# Patient Record
Sex: Male | Born: 1955 | Race: White | Hispanic: No | Smoking: Current every day smoker
Health system: Southern US, Community
[De-identification: ages and names within clinical notes are randomized; demographics above are authoritative.]

## PROBLEM LIST (undated history)

## (undated) DIAGNOSIS — R058 Other specified cough: Secondary | ICD-10-CM

## (undated) DIAGNOSIS — E1140 Type 2 diabetes mellitus with diabetic neuropathy, unspecified: Secondary | ICD-10-CM

## (undated) DIAGNOSIS — I5189 Other ill-defined heart diseases: Secondary | ICD-10-CM

## (undated) DIAGNOSIS — C679 Malignant neoplasm of bladder, unspecified: Secondary | ICD-10-CM

## (undated) DIAGNOSIS — R399 Unspecified symptoms and signs involving the genitourinary system: Secondary | ICD-10-CM

## (undated) DIAGNOSIS — I739 Peripheral vascular disease, unspecified: Secondary | ICD-10-CM

## (undated) DIAGNOSIS — E119 Type 2 diabetes mellitus without complications: Secondary | ICD-10-CM

## (undated) DIAGNOSIS — Z972 Presence of dental prosthetic device (complete) (partial): Secondary | ICD-10-CM

## (undated) DIAGNOSIS — I701 Atherosclerosis of renal artery: Secondary | ICD-10-CM

## (undated) DIAGNOSIS — Z8551 Personal history of malignant neoplasm of bladder: Secondary | ICD-10-CM

## (undated) DIAGNOSIS — I2510 Atherosclerotic heart disease of native coronary artery without angina pectoris: Secondary | ICD-10-CM

## (undated) DIAGNOSIS — Z8739 Personal history of other diseases of the musculoskeletal system and connective tissue: Secondary | ICD-10-CM

## (undated) DIAGNOSIS — I471 Supraventricular tachycardia, unspecified: Secondary | ICD-10-CM

## (undated) DIAGNOSIS — I10 Essential (primary) hypertension: Secondary | ICD-10-CM

## (undated) DIAGNOSIS — D126 Benign neoplasm of colon, unspecified: Secondary | ICD-10-CM

## (undated) DIAGNOSIS — R05 Cough: Secondary | ICD-10-CM

## (undated) DIAGNOSIS — Z8619 Personal history of other infectious and parasitic diseases: Secondary | ICD-10-CM

## (undated) DIAGNOSIS — K5790 Diverticulosis of intestine, part unspecified, without perforation or abscess without bleeding: Secondary | ICD-10-CM

## (undated) DIAGNOSIS — E785 Hyperlipidemia, unspecified: Secondary | ICD-10-CM

## (undated) DIAGNOSIS — J309 Allergic rhinitis, unspecified: Secondary | ICD-10-CM

## (undated) DIAGNOSIS — Z9189 Other specified personal risk factors, not elsewhere classified: Secondary | ICD-10-CM

## (undated) DIAGNOSIS — A630 Anogenital (venereal) warts: Secondary | ICD-10-CM

## (undated) DIAGNOSIS — J410 Simple chronic bronchitis: Secondary | ICD-10-CM

## (undated) DIAGNOSIS — K219 Gastro-esophageal reflux disease without esophagitis: Secondary | ICD-10-CM

## (undated) DIAGNOSIS — H269 Unspecified cataract: Secondary | ICD-10-CM

## (undated) DIAGNOSIS — I480 Paroxysmal atrial fibrillation: Secondary | ICD-10-CM

## (undated) DIAGNOSIS — Z794 Long term (current) use of insulin: Secondary | ICD-10-CM

## (undated) HISTORY — DX: Supraventricular tachycardia, unspecified: I47.10

## (undated) HISTORY — DX: Type 2 diabetes mellitus with diabetic neuropathy, unspecified: E11.40

## (undated) HISTORY — DX: Malignant neoplasm of bladder, unspecified: C67.9

## (undated) HISTORY — PX: MULTIPLE TOOTH EXTRACTIONS: SHX2053

## (undated) HISTORY — DX: Essential (primary) hypertension: I10

## (undated) HISTORY — PX: MOHS SURGERY: SHX181

## (undated) HISTORY — DX: Benign neoplasm of colon, unspecified: D12.6

## (undated) HISTORY — DX: Unspecified cataract: H26.9

## (undated) HISTORY — DX: Other ill-defined heart diseases: I51.89

## (undated) HISTORY — PX: CATARACT EXTRACTION W/ INTRAOCULAR LENS IMPLANT: SHX1309

## (undated) HISTORY — PX: OTHER SURGICAL HISTORY: SHX169

## (undated) HISTORY — DX: Paroxysmal atrial fibrillation: I48.0

## (undated) HISTORY — PX: CATARACT EXTRACTION: SUR2

## (undated) HISTORY — DX: Atherosclerotic heart disease of native coronary artery without angina pectoris: I25.10

## (undated) HISTORY — DX: Diverticulosis of intestine, part unspecified, without perforation or abscess without bleeding: K57.90

## (undated) HISTORY — DX: Atherosclerosis of renal artery: I70.1

## (undated) HISTORY — PX: PERINEAL HIDRADENITIS EXCISION: SUR524

## (undated) HISTORY — PX: INGUINAL HIDRADENITIS EXCISION: SHX1827

## (undated) HISTORY — DX: Supraventricular tachycardia: I47.1

## (undated) HISTORY — PX: CARDIOVASCULAR STRESS TEST: SHX262

---

## 1990-08-11 DIAGNOSIS — Z794 Long term (current) use of insulin: Secondary | ICD-10-CM

## 1990-08-11 DIAGNOSIS — E119 Type 2 diabetes mellitus without complications: Secondary | ICD-10-CM

## 1990-08-11 HISTORY — DX: Type 2 diabetes mellitus without complications: E11.9

## 1990-08-11 HISTORY — DX: Type 2 diabetes mellitus without complications: Z79.4

## 1995-08-12 HISTORY — PX: AXILLARY HIDRADENITIS EXCISION: SUR522

## 1997-12-27 ENCOUNTER — Ambulatory Visit (HOSPITAL_BASED_OUTPATIENT_CLINIC_OR_DEPARTMENT_OTHER): Admission: RE | Admit: 1997-12-27 | Discharge: 1997-12-27 | Payer: Self-pay | Admitting: Plastic Surgery

## 1998-06-15 ENCOUNTER — Ambulatory Visit (HOSPITAL_BASED_OUTPATIENT_CLINIC_OR_DEPARTMENT_OTHER): Admission: RE | Admit: 1998-06-15 | Discharge: 1998-06-15 | Payer: Self-pay | Admitting: Plastic Surgery

## 2000-04-15 ENCOUNTER — Emergency Department (HOSPITAL_COMMUNITY): Admission: EM | Admit: 2000-04-15 | Discharge: 2000-04-15 | Payer: Self-pay | Admitting: Emergency Medicine

## 2000-04-23 ENCOUNTER — Emergency Department (HOSPITAL_COMMUNITY): Admission: EM | Admit: 2000-04-23 | Discharge: 2000-04-23 | Payer: Self-pay | Admitting: Emergency Medicine

## 2000-12-17 ENCOUNTER — Encounter: Admission: RE | Admit: 2000-12-17 | Discharge: 2001-03-17 | Payer: Self-pay | Admitting: Endocrinology

## 2001-03-01 ENCOUNTER — Encounter (INDEPENDENT_AMBULATORY_CARE_PROVIDER_SITE_OTHER): Payer: Self-pay

## 2001-03-01 ENCOUNTER — Other Ambulatory Visit: Admission: RE | Admit: 2001-03-01 | Discharge: 2001-03-01 | Payer: Self-pay | Admitting: *Deleted

## 2001-08-11 HISTORY — PX: OTHER SURGICAL HISTORY: SHX169

## 2002-09-07 ENCOUNTER — Emergency Department (HOSPITAL_COMMUNITY): Admission: EM | Admit: 2002-09-07 | Discharge: 2002-09-07 | Payer: Self-pay | Admitting: Emergency Medicine

## 2002-09-07 ENCOUNTER — Encounter: Payer: Self-pay | Admitting: Emergency Medicine

## 2004-09-18 ENCOUNTER — Ambulatory Visit: Payer: Self-pay | Admitting: Endocrinology

## 2004-12-06 ENCOUNTER — Ambulatory Visit: Payer: Self-pay | Admitting: Endocrinology

## 2006-02-24 ENCOUNTER — Ambulatory Visit: Payer: Self-pay | Admitting: Endocrinology

## 2006-03-31 ENCOUNTER — Ambulatory Visit: Payer: Self-pay | Admitting: Endocrinology

## 2006-04-01 ENCOUNTER — Ambulatory Visit: Payer: Self-pay | Admitting: Endocrinology

## 2006-05-01 ENCOUNTER — Ambulatory Visit: Payer: Self-pay | Admitting: Endocrinology

## 2006-05-15 ENCOUNTER — Ambulatory Visit: Payer: Self-pay | Admitting: Endocrinology

## 2006-06-04 ENCOUNTER — Ambulatory Visit: Payer: Self-pay | Admitting: Gastroenterology

## 2006-06-15 ENCOUNTER — Ambulatory Visit: Payer: Self-pay | Admitting: Gastroenterology

## 2006-06-15 ENCOUNTER — Encounter (INDEPENDENT_AMBULATORY_CARE_PROVIDER_SITE_OTHER): Payer: Self-pay | Admitting: *Deleted

## 2006-06-30 ENCOUNTER — Ambulatory Visit: Payer: Self-pay | Admitting: Endocrinology

## 2006-09-21 ENCOUNTER — Ambulatory Visit: Payer: Self-pay | Admitting: Endocrinology

## 2006-09-21 LAB — CONVERTED CEMR LAB: Hgb A1c MFr Bld: 8.2 % — ABNORMAL HIGH (ref 4.6–6.0)

## 2006-10-14 ENCOUNTER — Ambulatory Visit: Payer: Self-pay | Admitting: Endocrinology

## 2006-12-09 ENCOUNTER — Ambulatory Visit: Payer: Self-pay | Admitting: Endocrinology

## 2007-03-03 ENCOUNTER — Ambulatory Visit: Payer: Self-pay | Admitting: Endocrinology

## 2007-03-06 ENCOUNTER — Encounter: Payer: Self-pay | Admitting: Endocrinology

## 2007-03-06 DIAGNOSIS — I10 Essential (primary) hypertension: Secondary | ICD-10-CM | POA: Insufficient documentation

## 2007-03-06 DIAGNOSIS — J309 Allergic rhinitis, unspecified: Secondary | ICD-10-CM | POA: Insufficient documentation

## 2007-06-02 ENCOUNTER — Ambulatory Visit: Payer: Self-pay | Admitting: Endocrinology

## 2007-06-02 LAB — CONVERTED CEMR LAB: Hgb A1c MFr Bld: 7.3 % — ABNORMAL HIGH (ref 4.6–6.0)

## 2007-06-03 ENCOUNTER — Telehealth (INDEPENDENT_AMBULATORY_CARE_PROVIDER_SITE_OTHER): Payer: Self-pay | Admitting: *Deleted

## 2007-07-29 ENCOUNTER — Ambulatory Visit (HOSPITAL_COMMUNITY): Admission: RE | Admit: 2007-07-29 | Discharge: 2007-07-29 | Payer: Self-pay | Admitting: Surgery

## 2007-08-17 ENCOUNTER — Encounter: Payer: Self-pay | Admitting: Endocrinology

## 2007-10-06 ENCOUNTER — Ambulatory Visit: Payer: Self-pay | Admitting: Endocrinology

## 2007-10-06 DIAGNOSIS — E7800 Pure hypercholesterolemia, unspecified: Secondary | ICD-10-CM | POA: Insufficient documentation

## 2007-10-07 LAB — CONVERTED CEMR LAB
ALT: 21 units/L (ref 0–53)
AST: 18 units/L (ref 0–37)
Albumin: 4 g/dL (ref 3.5–5.2)
Alkaline Phosphatase: 69 units/L (ref 39–117)
BUN: 14 mg/dL (ref 6–23)
Bilirubin, Direct: 0.1 mg/dL (ref 0.0–0.3)
CO2: 32 meq/L (ref 19–32)
Calcium: 9.9 mg/dL (ref 8.4–10.5)
Chloride: 103 meq/L (ref 96–112)
Cholesterol: 155 mg/dL (ref 0–200)
Creatinine, Ser: 1.1 mg/dL (ref 0.4–1.5)
GFR calc Af Amer: 91 mL/min
GFR calc non Af Amer: 75 mL/min
Glucose, Bld: 118 mg/dL — ABNORMAL HIGH (ref 70–99)
HDL: 29.7 mg/dL — ABNORMAL LOW (ref >39.0)
Hgb A1c MFr Bld: 6.9 % — ABNORMAL HIGH (ref 4.6–6.0)
LDL Cholesterol: 93 mg/dL (ref 0–99)
Potassium: 4 meq/L (ref 3.5–5.1)
Sodium: 140 meq/L (ref 135–145)
Total Bilirubin: 0.8 mg/dL (ref 0.3–1.2)
Total CHOL/HDL Ratio: 5.2
Total Protein: 7.5 g/dL (ref 6.0–8.3)
Triglycerides: 163 mg/dL — ABNORMAL HIGH (ref 0–149)
VLDL: 33 mg/dL (ref 0–40)

## 2007-10-15 ENCOUNTER — Telehealth: Payer: Self-pay | Admitting: Endocrinology

## 2007-11-25 ENCOUNTER — Encounter: Payer: Self-pay | Admitting: Internal Medicine

## 2008-01-17 ENCOUNTER — Ambulatory Visit: Payer: Self-pay | Admitting: Endocrinology

## 2008-01-17 DIAGNOSIS — M25519 Pain in unspecified shoulder: Secondary | ICD-10-CM

## 2008-01-17 DIAGNOSIS — A630 Anogenital (venereal) warts: Secondary | ICD-10-CM

## 2008-01-17 LAB — CONVERTED CEMR LAB: Hgb A1c MFr Bld: 7.1 % — ABNORMAL HIGH (ref 4.6–6.0)

## 2008-05-04 ENCOUNTER — Ambulatory Visit: Payer: Self-pay | Admitting: Endocrinology

## 2008-05-11 ENCOUNTER — Ambulatory Visit: Payer: Self-pay | Admitting: Endocrinology

## 2008-05-11 DIAGNOSIS — R05 Cough: Secondary | ICD-10-CM

## 2008-05-11 DIAGNOSIS — R059 Cough, unspecified: Secondary | ICD-10-CM | POA: Insufficient documentation

## 2008-05-12 ENCOUNTER — Telehealth (INDEPENDENT_AMBULATORY_CARE_PROVIDER_SITE_OTHER): Payer: Self-pay | Admitting: *Deleted

## 2008-06-08 ENCOUNTER — Encounter: Payer: Self-pay | Admitting: Endocrinology

## 2008-12-11 ENCOUNTER — Ambulatory Visit: Payer: Self-pay | Admitting: Endocrinology

## 2008-12-11 LAB — CONVERTED CEMR LAB: Hgb A1c MFr Bld: 7.3 % — ABNORMAL HIGH (ref 4.6–6.5)

## 2008-12-21 ENCOUNTER — Telehealth (INDEPENDENT_AMBULATORY_CARE_PROVIDER_SITE_OTHER): Payer: Self-pay | Admitting: *Deleted

## 2008-12-22 ENCOUNTER — Telehealth (INDEPENDENT_AMBULATORY_CARE_PROVIDER_SITE_OTHER): Payer: Self-pay | Admitting: *Deleted

## 2009-04-06 ENCOUNTER — Telehealth: Payer: Self-pay | Admitting: Endocrinology

## 2009-05-07 ENCOUNTER — Telehealth: Payer: Self-pay | Admitting: Endocrinology

## 2009-05-22 ENCOUNTER — Ambulatory Visit: Payer: Self-pay | Admitting: Endocrinology

## 2009-05-22 DIAGNOSIS — F17200 Nicotine dependence, unspecified, uncomplicated: Secondary | ICD-10-CM | POA: Insufficient documentation

## 2009-06-06 ENCOUNTER — Encounter (INDEPENDENT_AMBULATORY_CARE_PROVIDER_SITE_OTHER): Payer: Self-pay | Admitting: *Deleted

## 2009-06-29 ENCOUNTER — Encounter (INDEPENDENT_AMBULATORY_CARE_PROVIDER_SITE_OTHER): Payer: Self-pay | Admitting: *Deleted

## 2009-07-10 ENCOUNTER — Telehealth: Payer: Self-pay | Admitting: Endocrinology

## 2009-07-18 ENCOUNTER — Encounter (INDEPENDENT_AMBULATORY_CARE_PROVIDER_SITE_OTHER): Payer: Self-pay

## 2009-07-20 ENCOUNTER — Encounter (INDEPENDENT_AMBULATORY_CARE_PROVIDER_SITE_OTHER): Payer: Self-pay

## 2009-07-20 ENCOUNTER — Ambulatory Visit: Payer: Self-pay | Admitting: Gastroenterology

## 2009-07-24 ENCOUNTER — Telehealth: Payer: Self-pay | Admitting: Endocrinology

## 2009-07-25 ENCOUNTER — Ambulatory Visit: Payer: Self-pay | Admitting: Endocrinology

## 2009-07-25 DIAGNOSIS — J45909 Unspecified asthma, uncomplicated: Secondary | ICD-10-CM

## 2009-08-14 ENCOUNTER — Ambulatory Visit: Payer: Self-pay | Admitting: Gastroenterology

## 2009-08-16 ENCOUNTER — Encounter: Payer: Self-pay | Admitting: Gastroenterology

## 2009-08-25 ENCOUNTER — Ambulatory Visit: Payer: Self-pay | Admitting: Internal Medicine

## 2010-03-13 ENCOUNTER — Ambulatory Visit: Payer: Self-pay | Admitting: Endocrinology

## 2010-03-13 LAB — CONVERTED CEMR LAB: Hgb A1c MFr Bld: 7.4 % — ABNORMAL HIGH (ref 4.6–6.5)

## 2010-05-02 ENCOUNTER — Ambulatory Visit: Payer: Self-pay | Admitting: Endocrinology

## 2010-05-02 DIAGNOSIS — M79609 Pain in unspecified limb: Secondary | ICD-10-CM

## 2010-07-09 ENCOUNTER — Ambulatory Visit: Payer: Self-pay | Admitting: Endocrinology

## 2010-07-09 LAB — CONVERTED CEMR LAB
ALT: 24 units/L (ref 0–53)
AST: 20 units/L (ref 0–37)
Basophils Relative: 0.5 % (ref 0.0–3.0)
Bilirubin Urine: NEGATIVE
Chloride: 101 meq/L (ref 96–112)
Cholesterol: 144 mg/dL (ref 0–200)
Creatinine,U: 129.9 mg/dL
Eosinophils Relative: 5 % (ref 0.0–5.0)
GFR calc non Af Amer: 74.21 mL/min (ref >60)
HCT: 47 % (ref 39.0–52.0)
Hemoglobin, Urine: NEGATIVE
Hemoglobin: 16.7 g/dL (ref 13.0–17.0)
Hgb A1c MFr Bld: 7.5 % — ABNORMAL HIGH (ref 4.6–6.5)
LDL Cholesterol: 86 mg/dL (ref 0–99)
Lymphs Abs: 3.1 10*3/uL (ref 0.7–4.0)
MCV: 98.9 fL (ref 78.0–100.0)
Microalb, Ur: 1.5 mg/dL (ref 0.0–1.9)
Monocytes Absolute: 0.3 10*3/uL (ref 0.1–1.0)
Monocytes Relative: 2.6 % — ABNORMAL LOW (ref 3.0–12.0)
Potassium: 4.4 meq/L (ref 3.5–5.1)
RBC: 4.75 M/uL (ref 4.22–5.81)
Sodium: 136 meq/L (ref 135–145)
TSH: 1.04 microintl units/mL (ref 0.35–5.50)
Total Protein, Urine: NEGATIVE mg/dL
Total Protein: 6.9 g/dL (ref 6.0–8.3)
Urine Glucose: NEGATIVE mg/dL
Urobilinogen, UA: 0.2 (ref 0.0–1.0)
VLDL: 25.6 mg/dL (ref 0.0–40.0)
WBC: 12.2 10*3/uL — ABNORMAL HIGH (ref 4.5–10.5)

## 2010-09-08 LAB — CONVERTED CEMR LAB
ALT: 23 units/L (ref 0–53)
AST: 19 units/L (ref 0–37)
Albumin: 3.9 g/dL (ref 3.5–5.2)
Alkaline Phosphatase: 62 units/L (ref 39–117)
BUN: 16 mg/dL (ref 6–23)
Basophils Absolute: 0.1 10*3/uL (ref 0.0–0.1)
Basophils Relative: 1 % (ref 0.0–3.0)
Bilirubin Urine: NEGATIVE
Bilirubin, Direct: 0.1 mg/dL (ref 0.0–0.3)
CO2: 27 meq/L (ref 19–32)
Calcium: 9.8 mg/dL (ref 8.4–10.5)
Chloride: 101 meq/L (ref 96–112)
Cholesterol: 144 mg/dL (ref 0–200)
Creatinine, Ser: 1.1 mg/dL (ref 0.4–1.5)
Creatinine,U: 72.2 mg/dL
Eosinophils Absolute: 0.5 10*3/uL (ref 0.0–0.7)
Eosinophils Relative: 3.8 % (ref 0.0–5.0)
GFR calc non Af Amer: 74.53 mL/min (ref >60)
Glucose, Bld: 129 mg/dL — ABNORMAL HIGH (ref 70–99)
HCT: 46.3 % (ref 39.0–52.0)
HDL: 29.4 mg/dL — ABNORMAL LOW (ref >39.00)
Hemoglobin: 16.2 g/dL (ref 13.0–17.0)
Hgb A1c MFr Bld: 7 % — ABNORMAL HIGH (ref 4.6–6.5)
Ketones, ur: NEGATIVE mg/dL
LDL Cholesterol: 79 mg/dL (ref 0–99)
Leukocytes, UA: NEGATIVE
Lymphocytes Relative: 26.9 % (ref 12.0–46.0)
Lymphs Abs: 3.6 10*3/uL (ref 0.7–4.0)
MCHC: 34.9 g/dL (ref 30.0–36.0)
MCV: 98 fL (ref 78.0–100.0)
Microalb Creat Ratio: 13.9 mg/g (ref 0.0–30.0)
Microalb, Ur: 1 mg/dL (ref 0.0–1.9)
Monocytes Absolute: 0.9 10*3/uL (ref 0.1–1.0)
Monocytes Relative: 6.5 % (ref 3.0–12.0)
Neutro Abs: 8.2 10*3/uL — ABNORMAL HIGH (ref 1.4–7.7)
Neutrophils Relative %: 61.8 % (ref 43.0–77.0)
Nitrite: NEGATIVE
PSA: 1.05 ng/mL (ref 0.10–4.00)
Platelets: 283 10*3/uL (ref 150.0–400.0)
Potassium: 3.9 meq/L (ref 3.5–5.1)
RBC: 4.72 M/uL (ref 4.22–5.81)
RDW: 11.9 % (ref 11.5–14.6)
Sodium: 142 meq/L (ref 135–145)
Specific Gravity, Urine: 1.02 (ref 1.000–1.030)
TSH: 5.35 microintl units/mL (ref 0.35–5.50)
Total Bilirubin: 0.7 mg/dL (ref 0.3–1.2)
Total CHOL/HDL Ratio: 5
Total Protein, Urine: NEGATIVE mg/dL
Total Protein: 7.2 g/dL (ref 6.0–8.3)
Triglycerides: 179 mg/dL — ABNORMAL HIGH (ref 0.0–149.0)
Urine Glucose: NEGATIVE mg/dL
Urobilinogen, UA: 0.2 (ref 0.0–1.0)
VLDL: 35.8 mg/dL (ref 0.0–40.0)
WBC: 13.3 10*3/uL — ABNORMAL HIGH (ref 4.5–10.5)
pH: 5 (ref 5.0–8.0)

## 2010-09-10 ENCOUNTER — Ambulatory Visit
Admission: RE | Admit: 2010-09-10 | Discharge: 2010-09-10 | Payer: Self-pay | Source: Home / Self Care | Attending: Endocrinology | Admitting: Endocrinology

## 2010-09-10 ENCOUNTER — Encounter: Payer: Self-pay | Admitting: Endocrinology

## 2010-09-10 DIAGNOSIS — R9431 Abnormal electrocardiogram [ECG] [EKG]: Secondary | ICD-10-CM | POA: Insufficient documentation

## 2010-09-10 DIAGNOSIS — R160 Hepatomegaly, not elsewhere classified: Secondary | ICD-10-CM | POA: Insufficient documentation

## 2010-09-10 NOTE — Assessment & Plan Note (Signed)
Summary: CHEST COLD...AS.   Vital Signs:  Patient profile:   55 year old male Weight:      203 pounds O2 Sat:      97 % on Room air Temp:     98.5 degrees F oral Pulse rate:   84 / minute Resp:     22 per minute BP sitting:   132 / 70  (left arm)  Vitals Entered By: Doristine Devoid (August 25, 2009 10:17 AM)  O2 Flow:  Room air CC: sinus and chest congestion  Comments -wife recently diagnosed w/ flu and pneumonia   History of Present Illness: having "fluid down in my chest" Wife with pneumonia and now he is sick over the past couple of days  some SOB--even just with talking Cough--very "deep" --with clear mucus No fever but did have sweat  some head congestion some clear rhinorrhea No sore throat No ear pain  hasn't taken any meds except his inhaler  Allergies: No Known Drug Allergies  Past History:  Past medical, surgical, family and social histories (including risk factors) reviewed for relevance to current acute and chronic problems.  Past Medical History: Reviewed history from 05/11/2008 and no changes required. Allergic rhinitis Diabetes mellitus, type I Hypertension Smoker DM Nepharopathy Non-Cardiogenic Herpes Labialis Chronic left shoulder pain since injury (2004) Disable due 2 left rotator cuff injury Rectal Warts  Past Surgical History: Reviewed history from 03/06/2007 and no changes required. S/P Bilateral groin (1998) Stress Cardiogram (01/29/1996) EKG (03/31/2006)  Family History: Reviewed history from 05/22/2009 and no changes required. father had lung cancer  Social History: Reviewed history from 05/22/2009 and no changes required. disabled married  Review of Systems       No N/V appetite okay  Physical Exam  General:  alert.  NAD Head:  no sinus tenderness Ears:  R ear normal and L ear normal.   Nose:  mild nasal congestion Mouth:  mild pharyngeal injection without exudate Neck:  supple, no masses, and no cervical  lymphadenopathy.   Lungs:  normal respiratory effort, no intercostal retractions, no accessory muscle use, normal breath sounds, no crackles, and no wheezes.     Impression & Recommendations:  Problem # 1:  BRONCHITIS- ACUTE (ICD-466.0) Assessment New just started no wheezing or respiratory compromise likely just viral  discussed supportive care if worsening, will start doxy  His updated medication list for this problem includes:    Proventil 90 Mcg/act Aers (Albuterol) ..... Use prn    Doxycycline Hyclate 100 Mg Tabs (Doxycycline hyclate) .Marland Kitchen... 1 by mouth two times a day for bronchitis  Complete Medication List: 1)  Metformin Hcl 1000 Mg Tabs (Metformin hcl) .... Take 1 by mouth two times a day 2)  Clarinex 5 Mg Tabs (Desloratadine) .... Take 1 by mouth qd 3)  Zestoretic 20-12.5 Mg Tabs (Lisinopril-hydrochlorothiazide) .... Take 1 by mouth qd 4)  Proventil 90 Mcg/act Aers (Albuterol) .... Use prn 5)  Novolin 70/30 70-30 % Susp (Insulin isophane & regular) .... Inject 25 units q am, 15 units q pm 6)  Pravastatin Sodium 40 Mg Tabs (Pravastatin sodium) .... Take 2 by mouth qd 7)  Vicodin 5-500 Mg Tabs (Hydrocodone-acetaminophen) .Marland Kitchen.. 1 tid as needed pain 8)  Bd Insulin Syringe Ultrafine 31g X 5/16" 1 Ml Misc (Insulin syringe-needle u-100) 9)  Doxycycline Hyclate 100 Mg Tabs (Doxycycline hyclate) .Marland Kitchen.. 1 by mouth two times a day for bronchitis  Patient Instructions: 1)  Please schedule a follow-up appointment as needed .  2)  Please start the antibiotic if you start coughing up yellow or green mucus Prescriptions: DOXYCYCLINE HYCLATE 100 MG TABS (DOXYCYCLINE HYCLATE) 1 by mouth two times a day for bronchitis  #20 x 0   Entered and Authorized by:   Cindee Salt MD   Signed by:   Cindee Salt MD on 08/25/2009   Method used:   Print then Give to Patient   RxID:   [id number]

## 2010-09-10 NOTE — Letter (Signed)
Summary: Patient Notice- Polyp Results  [facility]  [address] [place], [place] [address]   Phone: [phone]  Fax: [phone]        August 16, 2009 MRN: [id number]    [name] [address] [place], [place]  [address]    Dear [name],  I am pleased to inform you that the colon polyp(s) removed during your recent colonoscopy was (were) found to be benign (no cancer detected) upon pathologic examination.  I recommend you have a repeat colonoscopy examination in 5_ years to look for recurrent polyps, as having colon polyps increases your risk for having recurrent polyps or even colon cancer in the future.  Should you develop new or worsening symptoms of abdominal pain, bowel habit changes or bleeding from the rectum or bowels, please schedule an evaluation with either your primary care physician or with me.  Additional information/recommendations:  __ No further action with gastroenterology is needed at this time. Please      follow-up with your primary care physician for your other healthcare      needs.  __ Please call [phone] to schedule a return visit to review your      situation.  __ Please keep your follow-up visit as already scheduled.  _x_ Continue treatment plan as outlined the day of your exam.  Please call us if you are having persistent problems or have questions about your condition that have not been fully answered at this time.  Sincerely,  Louis Meckel MD  This letter has been electronically signed by your physician.  Appended Document: Patient Notice- Polyp Results Letter mailed 01.10.11

## 2010-09-10 NOTE — Assessment & Plan Note (Signed)
Summary: FU ON DM /NWS  #   Vital Signs:  Patient profile:   55 year old male Height:      71 inches (180.34 cm) Weight:      195.50 pounds (88.86 kg) BMI:     27.37 O2 Sat:      96 % on Room air Temp:     98.1 degrees F (36.72 degrees C) oral Pulse rate:   69 / minute Pulse rhythm:   regular BP sitting:   138 / 64  (left arm) Cuff size:   large  Vitals Entered By: Brenton Grills CMA Duncan Dull) (July 09, 2010 9:14 AM)  O2 Flow:  Room air CC: Follow-up visit/aj Is Patient Diabetic? Yes   CC:  Follow-up visit/aj.  History of Present Illness: the status of at least 3 ongoing medical problems is addressed today: dm: no cbg record, but states cbg's are well-controlled.   he says in general, cbg's are highest before lunch, and lowest at hs.  he only takes 10 units insulin in the evening. htn: he takes zestoretic as rx'ed.  no cough. dyslipidemia: he tolerates pravachol well.   Current Medications (verified): 1)  Metformin Hcl 1000 Mg  Tabs (Metformin Hcl) .... Take 1 By Mouth Two Times A Day 2)  Clarinex 5 Mg  Tabs (Desloratadine) .... Take 1 By Mouth Qd 3)  Zestoretic 20-12.5 Mg  Tabs (Lisinopril-Hydrochlorothiazide) .... Take 1 By Mouth Qd 4)  Proventil 90 Mcg/act  Aers (Albuterol) .... Use Prn 5)  Novolin 70/30 70-30 %  Susp (Insulin Isophane & Regular) .... Inject 20 Units Each Am, 15 Units Before Evening Meal 6)  Pravastatin Sodium 40 Mg  Tabs (Pravastatin Sodium) .... Take 2 By Mouth Qd 7)  Vicodin 5-500 Mg Tabs (Hydrocodone-Acetaminophen) .Marland Kitchen.. 1 Tid As Needed Pain 8)  Bd Insulin Syringe Ultrafine 31g X 5/16" 1 Ml Misc (Insulin Syringe-Needle U-100) 9)  Doxycycline Hyclate 100 Mg Tabs (Doxycycline Hyclate) .Marland Kitchen.. 1 By Mouth Two Times A Day For Bronchitis  Allergies (verified): No Known Drug Allergies  Past History:  Past Medical History: Last updated: 05/11/2008 Allergic rhinitis Diabetes mellitus, type I Hypertension Smoker DM Nepharopathy Non-Cardiogenic Herpes  Labialis Chronic left shoulder pain since injury (2004) Disable due 2 left rotator cuff injury Rectal Warts  Review of Systems  The patient denies hypoglycemia.         he has lost 10 lbs, due to his efforts.   Physical Exam  General:  normal appearance.   Lungs:  Clear to auscultation bilaterally. Normal respiratory effort.  Heart:  Regular rate and rhythm without murmurs or gallops noted. Normal S1,S2.   Additional Exam:  LDL Cholesterol           86 mg/dL                    4-03 Hemoglobin A1C       [H]  7.5 %       Impression & Recommendations:  Problem # 1:  DIABETES MELLITUS, TYPE I (ICD-250.01) this is the best control this pt should aim for, given this regimen, which does match insulin to his changing needs throughout the day  Problem # 2:  HYPERCHOLESTEROLEMIA (ICD-272.0) well-controlled  Problem # 3:  HYPERTENSION (ICD-401.9) well-controlled  Medications Added to Medication List This Visit: 1)  Clarinex 5 Mg Tabs (Desloratadine) .... Take 1 by mouth once daily 2)  Zestoretic 20-12.5 Mg Tabs (Lisinopril-hydrochlorothiazide) .... Take 1 by mouth once daily 3)  Novolin 70/30  70-30 % Susp (Insulin isophane & regular) .... Inject 20 units each am, 15 units before evening meal 4)  Novolin 70/30 70-30 % Susp (Insulin isophane & regular) .... Inject 20 units each am, 10 units before evening meal 5)  Pravastatin Sodium 40 Mg Tabs (Pravastatin sodium) .... Take 2 by mouth once daily 6)  Relion Pen Needles 31g X 8 Mm Misc (Insulin pen needle) .... Two times a day  Other Orders: TLB-BMP (Basic Metabolic Panel-BMET) (80048-METABOL) TLB-CBC Platelet - w/Differential (85025-CBCD) TLB-Hepatic/Liver Function Pnl (80076-HEPATIC) TLB-TSH (Thyroid Stimulating Hormone) (84443-TSH) TLB-Lipid Panel (80061-LIPID) TLB-A1C / Hgb A1C (Glycohemoglobin) (83036-A1C) TLB-Microalbumin/Creat Ratio, Urine (82043-MALB) TLB-Udip w/ Micro (81001-URINE) Est. Patient Level IV (78295)  Patient  Instructions: 1)  please schedule a physical. 2)  blood tests are being ordered for you today.  please call [phone] to hear your test results.   3)  (update: i left message on phone-tree:  continue just 10 units of insulin in the evening) Prescriptions: RELION PEN NEEDLES 31G X 8 MM MISC (INSULIN PEN NEEDLE) two times a day  #60 x 11   Entered and Authorized by:   Minus Breeding MD   Signed by:   Minus Breeding MD on 07/09/2010   Method used:   Electronically to        [facility] (retail)       [address]       [place], [place]  [address]       Ph: [phone]       Fax: [phone]   RxID:   [id number]    Orders Added: 1)  TLB-BMP (Basic Metabolic Panel-BMET) [80048-METABOL] 2)  TLB-CBC Platelet - w/Differential [85025-CBCD] 3)  TLB-Hepatic/Liver Function Pnl [80076-HEPATIC] 4)  TLB-TSH (Thyroid Stimulating Hormone) [84443-TSH] 5)  TLB-Lipid Panel [80061-LIPID] 6)  TLB-A1C / Hgb A1C (Glycohemoglobin) [83036-A1C] 7)  TLB-Microalbumin/Creat Ratio, Urine [82043-MALB] 8)  TLB-Udip w/ Micro [81001-URINE] 9)  Est. Patient Level IV [87564]

## 2010-09-10 NOTE — Assessment & Plan Note (Signed)
Summary: FU---STC   Vital Signs:  Patient profile:   55 year old male Height:      71 inches (180.34 cm) Weight:      202.50 pounds (92.05 kg) BMI:     28.35 O2 Sat:      96 % on Room air Temp:     97.2 degrees F (36.22 degrees C) oral Pulse rate:   77 / minute BP sitting:   130 / 72  (left arm) Cuff size:   large  Vitals Entered By: Brenton Grills MA (March 13, 2010 10:44 AM)  O2 Flow:  Room air CC: F/U appt/refills-samples of Novolin, Pravastatin, Zestoretic/aj   CC:  F/U appt/refills-samples of Novolin, Pravastatin, and Zestoretic/aj.  History of Present Illness: he brings a record of his cbg's which i have reviewed today.  it is as low as 80 in the 80's.  in am, it varies from 105-180.  pt states he feels well in general.  Current Medications (verified): 1)  Metformin Hcl 1000 Mg  Tabs (Metformin Hcl) .... Take 1 By Mouth Two Times A Day 2)  Clarinex 5 Mg  Tabs (Desloratadine) .... Take 1 By Mouth Qd 3)  Zestoretic 20-12.5 Mg  Tabs (Lisinopril-Hydrochlorothiazide) .... Take 1 By Mouth Qd 4)  Proventil 90 Mcg/act  Aers (Albuterol) .... Use Prn 5)  Novolin 70/30 70-30 %  Susp (Insulin Isophane & Regular) .... Inject 25 Units Q Am, 15 Units Q Pm 6)  Pravastatin Sodium 40 Mg  Tabs (Pravastatin Sodium) .... Take 2 By Mouth Qd 7)  Vicodin 5-500 Mg Tabs (Hydrocodone-Acetaminophen) .Marland Kitchen.. 1 Tid As Needed Pain 8)  Bd Insulin Syringe Ultrafine 31g X 5/16" 1 Ml Misc (Insulin Syringe-Needle U-100) 9)  Doxycycline Hyclate 100 Mg Tabs (Doxycycline Hyclate) .Marland Kitchen.. 1 By Mouth Two Times A Day For Bronchitis  Allergies (verified): No Known Drug Allergies  Past History:  Past Medical History: Last updated: 05/11/2008 Allergic rhinitis Diabetes mellitus, type I Hypertension Smoker DM Nepharopathy Non-Cardiogenic Herpes Labialis Chronic left shoulder pain since injury (2004) Disable due 2 left rotator cuff injury Rectal Warts  Review of Systems  The patient denies syncope.     Physical Exam  General:  normal appearance.   Pulses:  dorsalis pedis intact bilat.   Extremities:  no deformity.  no ulcer on the feet.  feet are of normal color and temp.  no edema there are bilateral heavy callouses. mycotic toenails.   Neurologic:  sensation is intact to touch on the feet  Additional Exam:  Hemoglobin A1C       [H]  7.4 %   Impression & Recommendations:  Problem # 1:  DIABETES MELLITUS, TYPE I (ICD-250.01) this is the best control this pt should aim for, given this regimen, which does match insulin to his changing needs throughout the day  Medications Added to Medication List This Visit: 1)  Novolin 70/30 70-30 % Susp (Insulin isophane & regular) .... Inject 20 units each am, 15 units before evening meal  Other Orders: TLB-A1C / Hgb A1C (Glycohemoglobin) (83036-A1C) Est. Patient Level III (37169)  Patient Instructions: 1)  Please schedule a physical appointment in 3 months. 2)  blood tests are being ordered for you today.  please call [phone] to hear your test results. 3)  pending the test results, please reduce insulin to 20 units am and 15 units pm.

## 2010-09-10 NOTE — Procedures (Signed)
Summary: Colonoscopy  Patient: [name] Note: All result statuses are Final unless otherwise noted.  Tests: (1) Colonoscopy (COL)   COL Colonoscopy           DONE (C)     [facility]     [address]     [place], [place]  [address]           COLONOSCOPY PROCEDURE REPORT           PATIENT:  [name], Paul  MR#:  [id number]     BIRTHDATE:  12-27-1955, 53 yrs. old  GENDER:  male           ENDOSCOPIST:  Barbette Hair. Arlyce Dice, MD     Referred by:           PROCEDURE DATE:  08/14/2009     PROCEDURE:  Colonoscopy with cold bx removal of polyps     ASA CLASS:  Class II     INDICATIONS:  history of pre-cancerous (adenomatous) colon polyps                 MEDICATIONS:   Fentanyl 75 mcg IV, Versed 7 mg IV           DESCRIPTION OF PROCEDURE:   After the risks benefits and     alternatives of the procedure were thoroughly explained, informed     consent was obtained.  Digital rectal exam was performed and     revealed no abnormalities.   The LB [id number] endoscope     was introduced through the anus and advanced to the cecum, which     was identified by both the appendix and ileocecal valve, without     limitations.  The quality of the prep was excellent, using     MoviPrep.  The instrument was then slowly withdrawn as the colon     was fully examined.     <<PROCEDUREIMAGES>>           FINDINGS:  Two polyps were found in the sigmoid colon (see image16     and image17). 2 1-50mm polyps at 20cm from anus - removed via cold     bx forceps  There were multiple polyps identified and removed. in     the sigmoid colon. 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 Few diverticula  This     was otherwise a normal examination of the colon (see image2,     image3, image5, image6, image7, image10, image20, and image21).     Retroflexed views in the rectum revealed no  abnormalities.    The     scope was then withdrawn from the patient and the procedure     completed.           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           ______________________________  Barbette Hair. Arlyce Dice, MD           CC:  Minus Breeding, MD           n.     REVISED:  08/14/2009 09:52 AM     eSIGNED:   Barbette Hair. Lauraann Missey at 08/14/2009 09:52 AM           Page 2 of 3   [name], [name], [phone]  Note: An exclamation mark (!) indicates a result that was not dispersed into the flowsheet. Document Creation Date: 08/14/2009 9:52 AM _______________________________________________________________________  (1) Order result status: Final Collection or observation date-time: 08/14/2009 09:04 Requested date-time:  Receipt date-time:  Reported date-time:  Referring Physician:   Ordering Physician: Melvia Heaps [phone]) Specimen Source:  Source: Launa Grill Order Number: [id number] Lab site:   Appended Document: Colonoscopy     Procedures Next Due Date:    Colonoscopy: 08/2014

## 2010-09-10 NOTE — Assessment & Plan Note (Signed)
Summary: ?gout pain/flu shot/cd   Vital Signs:  Patient profile:   55 year old male Height:      71 inches (180.34 cm) Weight:      199.75 pounds (90.80 kg) BMI:     27.96 O2 Sat:      95 % on Room air Temp:     97.4 degrees F (36.33 degrees C) oral Pulse rate:   76 / minute BP sitting:   118 / 72  (left arm) Cuff size:   large  Vitals Entered By: Brenton Grills MA (May 02, 2010 9:42 AM)  O2 Flow:  Room air CC: Pt c/o bone spur in heel/samples of insulin/flu shot/aj Is Patient Diabetic? Yes   CC:  Pt c/o bone spur in heel/samples of insulin/flu shot/aj.  History of Present Illness: 2 weeks of mod pain at the left heel, but no assoc numbness.  he had injection there in the past, which helped.    Current Medications (verified): 1)  Metformin Hcl 1000 Mg  Tabs (Metformin Hcl) .... Take 1 By Mouth Two Times A Day 2)  Clarinex 5 Mg  Tabs (Desloratadine) .... Take 1 By Mouth Qd 3)  Zestoretic 20-12.5 Mg  Tabs (Lisinopril-Hydrochlorothiazide) .... Take 1 By Mouth Qd 4)  Proventil 90 Mcg/act  Aers (Albuterol) .... Use Prn 5)  Novolin 70/30 70-30 %  Susp (Insulin Isophane & Regular) .... Inject 20 Units Each Am, 15 Units Before Evening Meal 6)  Pravastatin Sodium 40 Mg  Tabs (Pravastatin Sodium) .... Take 2 By Mouth Qd 7)  Vicodin 5-500 Mg Tabs (Hydrocodone-Acetaminophen) .Marland Kitchen.. 1 Tid As Needed Pain 8)  Bd Insulin Syringe Ultrafine 31g X 5/16" 1 Ml Misc (Insulin Syringe-Needle U-100) 9)  Doxycycline Hyclate 100 Mg Tabs (Doxycycline Hyclate) .Marland Kitchen.. 1 By Mouth Two Times A Day For Bronchitis  Allergies (verified): No Known Drug Allergies  Review of Systems  The patient denies fever.    Physical Exam  General:  obese.  no distress Pulses:  dorsalis pedis intact bilat.   Extremities:  no deformity.  no ulcer on the feet.  feet are of normal color and temp.  no edema. there are bilateral heavy callouses. mycotic toenails.   the plantar aspect of the left foot is  nontender. Neurologic:  sensation is intact to touch on the feet    Impression & Recommendations:  Problem # 1:  FOOT PAIN, LEFT (ICD-729.5) recurrent  Other Orders: Est. Patient Level III ([address])  Patient Instructions: 1)  here are some samples of arthrotec 75 mg two times a day. 2)  if pain persists, call, and i would be happy to refer you back to dr Renae Fickle.  Appended Document: ?gout pain/flu shot/cd     Allergies: No Known Drug Allergies   Other Orders: Flu Vaccine 90yrs + MEDICARE PATIENTS (U9811) Administration Flu vaccine - MCR (B1478) Flu Vaccine Consent Questions     Do you have a history of severe allergic reactions to this vaccine? no    Any prior history of allergic reactions to egg and/or gelatin? no    Do you have a sensitivity to the preservative Thimersol? no    Do you have a past history of Guillan-Barre Syndrome? no    Do you currently have an acute febrile illness? no    Have you ever had a severe reaction to latex? no    Vaccine information given and explained to patient? yes    Are you currently pregnant? no    Lot Number:[id number]  Exp Date:02/08/2011   Site Given  Left Deltoid IM .lbmedflu

## 2010-09-16 ENCOUNTER — Ambulatory Visit (HOSPITAL_COMMUNITY): Payer: Medicare Other | Attending: Endocrinology

## 2010-09-16 DIAGNOSIS — I10 Essential (primary) hypertension: Secondary | ICD-10-CM | POA: Insufficient documentation

## 2010-09-16 DIAGNOSIS — F17200 Nicotine dependence, unspecified, uncomplicated: Secondary | ICD-10-CM | POA: Insufficient documentation

## 2010-09-16 DIAGNOSIS — E785 Hyperlipidemia, unspecified: Secondary | ICD-10-CM | POA: Insufficient documentation

## 2010-09-16 DIAGNOSIS — E119 Type 2 diabetes mellitus without complications: Secondary | ICD-10-CM | POA: Insufficient documentation

## 2010-09-16 DIAGNOSIS — R9431 Abnormal electrocardiogram [ECG] [EKG]: Secondary | ICD-10-CM | POA: Insufficient documentation

## 2010-09-19 ENCOUNTER — Other Ambulatory Visit: Payer: Self-pay | Admitting: Endocrinology

## 2010-09-19 DIAGNOSIS — R160 Hepatomegaly, not elsewhere classified: Secondary | ICD-10-CM

## 2010-09-24 ENCOUNTER — Ambulatory Visit
Admission: RE | Admit: 2010-09-24 | Discharge: 2010-09-24 | Disposition: A | Discharge status: Home / Self Care | Payer: Medicare Other | Source: Ambulatory Visit | Attending: Endocrinology | Admitting: Endocrinology

## 2010-09-24 DIAGNOSIS — R160 Hepatomegaly, not elsewhere classified: Secondary | ICD-10-CM

## 2010-09-26 NOTE — Assessment & Plan Note (Signed)
Summary: PER PT PHYSICAL--MEDICARE-STC   Vital Signs:  Patient profile:   55 year old male Height:      71 inches (180.34 cm) Weight:      193 pounds (87.73 kg) BMI:     27.02 O2 Sat:      95 % on Room air Temp:     98.4 degrees F (36.89 degrees C) oral Pulse rate:   80 / minute Pulse rhythm:   regular BP sitting:   120 / 68  (left arm) Cuff size:   large  Vitals Entered By: Brenton Grills CMA Duncan Dull) (September 10, 2010 9:44 AM)  O2 Flow:  Room air CC: Yearly Medicare Wellness/aj Is Patient Diabetic? Yes   CC:  Yearly Medicare Wellness/aj.  History of Present Illness: here for regular wellness examination.  He's feeling pretty well in general, and does not drink etoh.   Current Medications (verified): 1)  Metformin Hcl 1000 Mg  Tabs (Metformin Hcl) .... Take 1 By Mouth Two Times A Day 2)  Clarinex 5 Mg  Tabs (Desloratadine) .... Take 1 By Mouth Once Daily 3)  Zestoretic 20-12.5 Mg  Tabs (Lisinopril-Hydrochlorothiazide) .... Take 1 By Mouth Once Daily 4)  Proventil 90 Mcg/act  Aers (Albuterol) .... Use Prn 5)  Novolin 70/30 70-30 %  Susp (Insulin Isophane & Regular) .... Inject 20 Units Each Am, 10 Units Before Evening Meal 6)  Pravastatin Sodium 40 Mg  Tabs (Pravastatin Sodium) .... Take 2 By Mouth Once Daily 7)  Bd Insulin Syringe Ultrafine 31g X 5/16" 1 Ml Misc (Insulin Syringe-Needle U-100) 8)  Relion Pen Needles 31g X 8 Mm Misc (Insulin Pen Needle) .... Two Times A Day 9)  Vicodin 5-500 Mg Tabs (Hydrocodone-Acetaminophen) .Marland Kitchen.. 1 Tablet By Mouth Three Times A Day As Needed For Pain  Allergies (verified): No Known Drug Allergies  Family History: Reviewed history from 05/22/2009 and no changes required. father had lung cancer no heart dz  Social History: Reviewed history from 05/22/2009 and no changes required. disabled married  Review of Systems  The patient denies fever, weight loss, weight gain, vision loss, decreased hearing, syncope, headaches, melena,  hematochezia, severe indigestion/heartburn, hematuria, suspicious skin lesions, and depression.    Physical Exam  General:  Well developed, well nourished, in no acute distress.  Head:  head: no deformity eyes: no periorbital swelling, no proptosis external nose and ears are normal mouth: no lesion seen Neck:  Supple without thyroid enlargement or tenderness.  Heart:  Regular rate and rhythm without murmurs or gallops noted. Normal S1,S2.   Rectal:  normal external and internal exam.  heme neg there are multiple externa warts (chronic) Prostate:  Normal size prostate without masses or tenderness.  Msk:  muscle bulk and strength are grossly normal.  no obvious joint swelling.  gait is normal and steady  Extremities:  no deformity.  no ulcer on the feet.  feet are of normal color and temp.  no edema. there are bilateral heavy callouses. mycotic toenails.   the plantar aspect of the left foot is nontender. Neurologic:  cn 2-12 grossly intact.   readily moves all 4's.   sensation is intact to touch on the feet  Skin:  normal texture and temp.  no rash.  not diaphoretic  Cervical Nodes:  No significant adenopathy.  Psych:  Alert and cooperative; normal mood and affect; normal attention span and concentration.   Additional Exam:  SEPARATE EVALUATION FOLLOWS--EACH PROBLEM HERE IS NEW, NOT RESPONDING TO TREATMENT, OR POSES SIGNIFICANT  RISK TO THE PATIENT'S HEALTH: HISTORY OF THE PRESENT ILLNESS: pt states few years of slight dry cough in the chest.  no assoc sob abnormal ecg is noted.  no change, but it is peristent. hepatomegaly is suggested by exam PAST MEDICAL HISTORY reviewed and up to date today REVIEW OF SYSTEMS: denies chest pain and abdominal pain. PHYSICAL EXAMINATION: abdomen is soft, nontender.  liver edge is palpable.  no plenomegaly.   not distended.  no hernia clear to auscultation.  no respiratory distress dorsalis pedis intact bilat.  no carotid bruit LAB/XRAY  RESULTS: IMPRESSION: hepatomegaly, uncertain etiology abnormal ecg cough, uncertain etiology PLAN: see instruction sheet   Impression & Recommendations:  Problem # 1:  ROUTINE GENERAL MEDICAL EXAM@HEALTH  CARE FACL (ICD-V70.0)  Other Orders: Echo Referral (Echo) Radiology Referral (Radiology) T-2 View CXR (71020TC) Est. Patient Level IV (16109) Est. Patient 40-64 years (60454)  Patient Instructions: 1)  check "echo."  you will be called with a day and time for an appointment. 2)  also check ultrasound of the abdomen.  you will be called with a day and time for an appointment 3)  check chest x ray. 4)  check your blood sugar 2 times a day.  vary the time of day when you check, between before the 3 meals, and at bedtime.  also check if you have symptoms of your blood sugar being too high or too low.  please keep a record of the readings and bring it to your next appointment here.  please call us sooner if you are having low blood sugar episodes. 5)  Please schedule a follow-up appointment in 1 month. 6)  please consider these measures for your health:  minimize alcohol.  do not use tobacco products.  have a colonoscopy at least every 10 years from age 36.  keep firearms safely stored.  always use seat belts.  have working smoke alarms in your home.  see an eye doctor and dentist regularly.  never drive under the influence of alcohol or drugs (including prescription drugs).  those with fair skin should take precautions against the sun. 7)  please let me know what your wishes would be, if artificial life support measures should become necessary.  it is critically important to prevent falling down (keep floor areas well-lit, dry, and free of loose objects)   Orders Added: 1)  Echo Referral [Echo] 2)  Radiology Referral [Radiology] 3)  T-2 View CXR [71020TC] 4)  Est. Patient Level IV [09811] 5)  Est. Patient 40-64 years [91478]

## 2010-12-02 ENCOUNTER — Encounter: Payer: Self-pay | Admitting: Endocrinology

## 2010-12-02 ENCOUNTER — Ambulatory Visit (INDEPENDENT_AMBULATORY_CARE_PROVIDER_SITE_OTHER): Payer: Medicare Other | Admitting: Endocrinology

## 2010-12-02 ENCOUNTER — Other Ambulatory Visit (INDEPENDENT_AMBULATORY_CARE_PROVIDER_SITE_OTHER): Payer: Medicare Other

## 2010-12-02 VITALS — BP 118/72 | HR 86 | Temp 98.2°F | Ht 69.0 in | Wt 183.0 lb

## 2010-12-02 DIAGNOSIS — E109 Type 1 diabetes mellitus without complications: Secondary | ICD-10-CM

## 2010-12-02 DIAGNOSIS — E119 Type 2 diabetes mellitus without complications: Secondary | ICD-10-CM

## 2010-12-02 DIAGNOSIS — S6980XA Other specified injuries of unspecified wrist, hand and finger(s), initial encounter: Secondary | ICD-10-CM

## 2010-12-02 LAB — HEMOGLOBIN A1C: Hgb A1c MFr Bld: 7.9 % — ABNORMAL HIGH (ref 4.6–6.5)

## 2010-12-02 MED ORDER — DOXYCYCLINE HYCLATE 100 MG PO CAPS: 100.0000 mg | ORAL_CAPSULE | Freq: Two times a day (BID) | ORAL | Status: DC

## 2010-12-02 MED ORDER — DOXYCYCLINE HYCLATE 100 MG PO CAPS: 100.0000 mg | ORAL_CAPSULE | Freq: Two times a day (BID) | ORAL | Status: AC

## 2010-12-02 NOTE — Patient Instructions (Addendum)
i have sent a prescription to your pharmacy for an antibiotic.  Please call if the pain or redness does not resolve, or if it get worse. blood tests are being ordered for you today.  please call [phone] to hear your test results. pending the test results, please continue the same insulin for now. Please make a follow-up appointment in 3 months (update: i left message on phone-tree:  rx as we discussed)

## 2010-12-02 NOTE — Progress Notes (Signed)
  Subjective:    Patient ID: [name], male    DOB: Sep 09, 1955, 55 y.o.   MRN: [id number]  HPI Pt accidentally stabbed his right thumb with a coathanger yesterday.  Since then, he has 1 day of moderate pain at the pad of the right thumb, but no assoc fever. no cbg record, but states cbg's are well-controlled  Past Medical History  Diagnosis Date  . Allergy   . Diabetes mellitus   . Hypertension   . Diabetic neuropathy   . Herpes labialis   . Wart, venereal     Rectal   No past surgical history on file.  reports that he has been smoking.  He does not have any smokeless tobacco history on file. His alcohol and drug histories not on file. family history is not on file. No Known Allergies Review of Systems He has lost a few lbs.  denies hypoglycemia    Objective:   Physical Exam GENERAL: no distress Right thumb:  There is a small puncture wound on the fingerpad.  There is minimal erythema throughout the pad.      Lab Results  Component Value Date   HGBA1C 7.9* 12/02/2010    Assessment & Plan:  Dm.  this is the best control this pt should aim for, given this regimen, which does match insulin to his changing needs throughout the day. Thumb injury, new problem

## 2010-12-12 ENCOUNTER — Telehealth: Payer: Self-pay | Admitting: Endocrinology

## 2010-12-12 NOTE — Telephone Encounter (Signed)
Message copied by Romero Belling on Thu Dec 12, 2010 12:39 PM ------      Message from: Brenton Grills      Created: Thu Dec 12, 2010 11:32 AM       Pt states he did not get a tetanus shot, and will get one at next OV. Pt was advised to schedule a nurse visit for tetanus shot. Pt states he will call back.      ----- Message -----         From: Minus Breeding, MD         Sent: 12/07/2010  10:41 AM           To: Brenton Grills            please call patient:      Did he get his tetanus shot when he was here last week.  If not, could he please come and get?

## 2010-12-24 NOTE — Op Note (Signed)
[name], [name] NO.:  [id number]   MEDICAL RECORD NO.:  [id number]          PATIENT TYPE:  AMB   LOCATION:  DAY                          FACILITY:  [facility]   PHYSICIAN:  Ardeth Sportsman, MD     DATE OF BIRTH:  [birth year]   DATE OF PROCEDURE:  07/29/2007  DATE OF DISCHARGE:                               OPERATIVE REPORT   PRIMARY CARE PHYSICIAN:  Sean A. Everardo All, MD.   GASTROENTEROLOGIST:  Barbette Hair. Arlyce Dice, MD.   DERMATOLOGIST:  Elmon Else, MD.   UROLOGISTVeverly Fells. Vernie Ammons, MD as well as Vonzell Schlatter. Patsi Sears, MD of  [facility].   PREOPERATIVE DIAGNOSES:  1. 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  2. Penile swelling most likely secondary to lymphatic injection and      question of elephantitis.  3. Question of history of prior Fournier's gangrene in the past status      post debridement and closure.   POSTOPERATIVE DIAGNOSES:  1. 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  2. Penile swelling most likely secondary to lymphatic injection and      question of elephantitis.  3. Question of history of prior Fournier's gangrene in the past status      post debridement and closure.   PROCEDURE:  1. Examination under anesthesia.  2. CO2 ablation of penile and perianal warts.   ANESTHESIA:  1. Moderate or deep sedation with laryngeal mask airway.  2. Anorectal block.   SPECIMENS:  None.   DRAINS:  None.   ESTIMATED BLOOD LOSS:  Less than 2 mL.   COMPLICATIONS:  None apparent.   INDICATIONS FOR PROCEDURE:  [name] is a 55 year old gentleman who  developed anal warts for about the past year. He feels it is related to  after having his colonoscopy. He has been seen by Dr. Emily Filbert with  dermatology who excised 2 of the larger lesions __________ perirenal  lesions. 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  He has been abstinent for sex for over 5 years. No history of any anal  disease or unprotected sex. Given the abnormal lesions, recommendation  was made for ablation and/or removal. Pathophysiology of warts were  discussed, technique of laser ablation was discussed and risks such as  stroke, heart attack, deep venous thrombosis, pulmonary embolism and  death were discussed.  Risks such as bleeding, recurrence of lesions,  anal stricture, prolonged pain and other risks were discussed. Questions  answered, he agreed to proceed.   The patient also had some penile swelling and has been seen by Dr.  Lynelle Smoke I. Tannenbaum in the past for this. There is a question of soft  tissue mass versus lymphatic congestion of an elephantitis nature.  I  saw Dr. Ihor Gully and requested an intraoperative consult with him as  well and examination of the patient and he was willing to proceed.   FINDINGS:  He had one wart in the remaining foreskin of his penis. He  has marked increased swelling of the shaft of his penis that is  apparently  soft tissue but not completely decompressible. Just posterior  to his scrotum along the midline raphe as well as the perianal skin had  obvious small definite verrucous warts suspicious for condylomata  acuminatum. Anoscopy revealed no intrarectal lesions.   DESCRIPTION OF PROCEDURE:  Informed consent was obtained. The patient  underwent general anesthesia without difficulty. He was positioned in  low lithotomy. His genitals and perirectal region were prepped and  draped in a sterile fashion. Wet towels were used to help protect the  skin against the ablation. Examination revealed findings as noted above.  We had Dr. Ihor Gully come in and examine. He wondered if perhaps  reconsultation with urology would be helpful and reconsider perhaps a  combined case with he and Dr. Patsi Sears to better address the penile  swelling, but he will table this issue for now.   The CO2 laser was set initially at 4 and then increased up to 7 for  proper  ablation. Initially did ablation round midline raphe structures  between the scrotum and anus and ablated numerous small verrucous wart  lesions. I focused around the perianal region as well. An anoscopy was  performed and there was no rectal lesions or lesions of the anal mucosa.   Inspection of the penis noted a single wart in the posterior midline  that was ablated with laser as well. Careful inspection was made and  pulled his foreskin back which was obviously edematous and swollen.  There was no other abnormal lesions. Reinspection was made in all areas  again and a few small but suspicious areas were ablated as well.   The patient was extubated and taken to the recovery room in stable  condition. I let his neighbor know that the surgery was completed but  per the patient request I did not discuss the details of the procedure  itself. I will followup with the patient in a few weeks. I discussed  postoperative instructions with the patient in detail just prior to  surgery as well. He does have a detailed postoperative handout that will  be given to him prior to discharge.      Ardeth Sportsman, MD  Electronically Signed     SCG/MEDQ  D:  07/29/2007  T:  07/29/2007  Job:  [id number]   cc:   Gregary Signs A. Everardo All, MD  [address]  [place]  [place] [address]   Barbette Hair. Arlyce Dice, MD,FACG  [address]  [place]  [place] [address]   Christianne Dolin, MD   Veverly Fells. Vernie Ammons, M.D.  Fax: [phone]   Sigmund I. Patsi Sears, M.D.  Fax: [phone]

## 2010-12-27 NOTE — Assessment & Plan Note (Signed)
[facility]                           GASTROENTEROLOGY OFFICE NOTE   [name]                        MRN:          [id number]  DATE:06/04/2006                            DOB:          August 18, 1955    REASON FOR CONSULTATION:  Rectal bleeding.   HISTORY OF PRESENT ILLNESS:  [name] is a 55 year old white male referred  through the courtesy of Dr. Everardo All for evaluation.  He had one episode of  hematochezia which  he attributes to his venereal warts.  He has had  occasional sharp right lower quadrant pain.  He has no history of melena.  He has intermittent diarrhea.  Recently anal warts were diagnosed.  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  On April 01, 2006,  hemoglobin was 15.   PAST MEDICAL HISTORY:  Insulin dependent diabetes.   FAMILY HISTORY:  Positive for father with lung cancer.   MEDICATIONS:  1. Actos.  2. Glucophage.  3. Aspirin.  4. Zocor.  5. Clarinex.  6. Zestoretic.  7. Insulin.   ALLERGIES:  NO KNOWN DRUG ALLERGIES.   SOCIAL HISTORY:  He smokes a pack a day.  He does not drink.  He is married  and is on disability for a shoulder injury.   REVIEW OF SYSTEMS:  Reviewed and negative.   PHYSICAL EXAMINATION:  VITAL SIGNS:  Pulse 72, blood pressure 122/64, weight  218.  HEENT:  EOMI. PERRLA. Sclerae are anicteric.  Conjunctivae are pink.  NECK:  Supple without thyromegaly, adenopathy or carotid bruits.  CHEST:  Clear to auscultation and percussion without adventitious sounds.  CARDIAC:  Regular rhythm; normal S1 S2.  There are no murmurs, gallops or  rubs.  ABDOMEN:  Bowel sounds are normoactive.  Abdomen is soft, non-tender and non-  distended.  There are no abdominal masses, tenderness, splenic enlargement  or hepatomegaly.  EXTREMITIES:  Full range of motion.  No cyanosis, clubbing or edema.  RECTAL:  Deferred.   IMPRESSION:  Limited rectal bleeding.  Possible sources may include polyps,  hemorrhoids or neoplasm.  I doubt he is bleeding from his venereal warts.   RECOMMENDATION:  Colonoscopy.     Barbette Hair. Arlyce Dice, MD,FACG    RDK/MedQ  DD: 06/04/2006  DT: 06/05/2006  Job #: [id number]

## 2010-12-31 ENCOUNTER — Other Ambulatory Visit: Payer: Self-pay | Admitting: *Deleted

## 2010-12-31 MED ORDER — LISINOPRIL-HYDROCHLOROTHIAZIDE 20-12.5 MG PO TABS: 1.0000 | ORAL_TABLET | Freq: Every day | ORAL | Status: DC

## 2010-12-31 NOTE — Telephone Encounter (Signed)
R'cd fax from [facility] for refill of Zestoretic

## 2011-01-09 ENCOUNTER — Other Ambulatory Visit: Payer: Self-pay

## 2011-01-09 MED ORDER — INSULIN NPH ISOPHANE & REGULAR (70-30) 100 UNIT/ML ~~LOC~~ SUSP: SUBCUTANEOUS | Status: DC

## 2011-01-09 MED ORDER — HYDROCODONE-ACETAMINOPHEN 5-500 MG PO TABS: 1.0000 | ORAL_TABLET | Freq: Three times a day (TID) | ORAL | Status: DC | PRN

## 2011-01-09 NOTE — Telephone Encounter (Signed)
Pt informed, Rx in cabinet for pt pick up  

## 2011-02-10 ENCOUNTER — Other Ambulatory Visit (INDEPENDENT_AMBULATORY_CARE_PROVIDER_SITE_OTHER): Payer: Medicare Other

## 2011-02-10 ENCOUNTER — Encounter: Payer: Self-pay | Admitting: Endocrinology

## 2011-02-10 ENCOUNTER — Ambulatory Visit (INDEPENDENT_AMBULATORY_CARE_PROVIDER_SITE_OTHER): Payer: Medicare Other | Admitting: Endocrinology

## 2011-02-10 VITALS — BP 104/62 | HR 70 | Temp 97.9°F | Ht 69.0 in | Wt 180.0 lb

## 2011-02-10 DIAGNOSIS — Z23 Encounter for immunization: Secondary | ICD-10-CM

## 2011-02-10 DIAGNOSIS — E109 Type 1 diabetes mellitus without complications: Secondary | ICD-10-CM

## 2011-02-10 NOTE — Progress Notes (Signed)
  Subjective:    Patient ID: [name], male    DOB: May 14, 1956, 55 y.o.   MRN: [id number]  HPI pt states he feels well in general, except he reports difficulty losing weight.   no cbg record, but states cbg's are well-controlled. Past Medical History  Diagnosis Date  . Allergy   . Diabetes mellitus   . Hypertension   . Diabetic neuropathy   . Herpes labialis   . Wart, venereal     Rectal    No past surgical history on file.  History   Social History  . Marital Status: Married    Spouse Name: N/A    Number of Children: N/A  . Years of Education: N/A   Occupational History  . Not on file.   Social History Main Topics  . Smoking status: Current Everyday Smoker -- 1.0 packs/day  . Smokeless tobacco: Not on file  . Alcohol Use: Not on file  . Drug Use: Not on file  . Sexually Active: Not on file   Other Topics Concern  . Not on file   Social History Narrative  . No narrative on file    Current Outpatient Prescriptions on File Prior to Visit  Medication Sig Dispense Refill  . albuterol (PROVENTIL) 90 MCG/ACT inhaler Inhale 2 puffs into the lungs every 6 (six) hours as needed.        . desloratadine (CLARINEX) 5 MG tablet Take 5 mg by mouth as needed.       Marland Kitchen HYDROcodone-acetaminophen (VICODIN) 5-500 MG per tablet Take 1 tablet by mouth every 8 (eight) hours as needed.  50 tablet  1  . insulin NPH-insulin regular (NOVOLIN 70/30) (70-30) 100 UNIT/ML injection Inject 20 units each am and 10 units before evening meal  10 mL  1  . Insulin Pen Needle (RELION PEN NEEDLE 31G/8MM) 31G X 8 MM MISC Two times a day      . Insulin Syringe-Needle U-100 (BD INSULIN SYRINGE ULTRAFINE) 31G X 5/16" 1 ML MISC Two times a day      . lisinopril-hydrochlorothiazide (PRINZIDE,ZESTORETIC) 20-12.5 MG per tablet Take 1 tablet by mouth daily.  30 tablet  5  . metFORMIN (GLUCOPHAGE) 1000 MG tablet Take 1,000 mg by mouth 2 (two) times daily with a meal.        . pravastatin (PRAVACHOL) 40 MG  tablet 2 tablets by mouth daily        No Known Allergies  No family history on file.  BP 104/62  Pulse 70  Temp(Src) 97.9 F (36.6 C) (Oral)  Ht 5\' 9"  (1.753 m)  Wt 180 lb (81.647 kg)  BMI 26.58 kg/m2  SpO2 98%  Review of Systems denies hypoglycemia.      Objective:   Physical Exam GENERAL: no distress Pulses: dorsalis pedis intact bilat.   Feet: no deformity.  no ulcer on the feet.  feet are of normal color and temp.  no edema.  There is bilteral onychomycosis.  There are bilat heavy calluses. Neuro: sensation is intact to touch on the feet.    Lab Results  Component Value Date   HGBA1C 8.0* 02/10/2011    Assessment & Plan:  Dm, needs increased rx

## 2011-02-10 NOTE — Patient Instructions (Addendum)
blood tests are being ordered for you today.  please call [phone] to hear your test results.  You will be prompted to enter the 9-digit "MRN" number that appears at the top left of this page, followed by #.  Then you will hear the message. pending the test results, please continue the same insulin for now. Please make a follow-up appointment in 4 months. (update: i left message on phone-tree:  Increase am insulin to 22 units).

## 2011-02-26 ENCOUNTER — Other Ambulatory Visit: Payer: Self-pay | Admitting: Endocrinology

## 2011-04-14 ENCOUNTER — Other Ambulatory Visit: Payer: Self-pay | Admitting: Endocrinology

## 2011-04-15 NOTE — Telephone Encounter (Signed)
Rx faxed to [facility].  

## 2011-04-15 NOTE — Telephone Encounter (Signed)
Please advise-last written 01/09/2011 #50 with 1 refill

## 2011-05-16 LAB — BASIC METABOLIC PANEL
BUN: 10
Calcium: 9.3
Creatinine, Ser: 1.13
GFR calc non Af Amer: >60
Glucose, Bld: 114 — ABNORMAL HIGH
Sodium: 142

## 2011-05-16 LAB — URINALYSIS, ROUTINE W REFLEX MICROSCOPIC
Glucose, UA: NEGATIVE
Hgb urine dipstick: NEGATIVE
Ketones, ur: 15 — AB
Protein, ur: NEGATIVE

## 2011-06-27 ENCOUNTER — Other Ambulatory Visit: Payer: Self-pay | Admitting: Endocrinology

## 2011-06-30 ENCOUNTER — Encounter: Payer: Self-pay | Admitting: Endocrinology

## 2011-06-30 ENCOUNTER — Ambulatory Visit (INDEPENDENT_AMBULATORY_CARE_PROVIDER_SITE_OTHER): Payer: Medicare Other | Admitting: Endocrinology

## 2011-06-30 ENCOUNTER — Other Ambulatory Visit (INDEPENDENT_AMBULATORY_CARE_PROVIDER_SITE_OTHER): Payer: Medicare Other

## 2011-06-30 VITALS — BP 118/62 | HR 70 | Temp 98.2°F | Ht 69.0 in | Wt 181.0 lb

## 2011-06-30 DIAGNOSIS — E109 Type 1 diabetes mellitus without complications: Secondary | ICD-10-CM

## 2011-06-30 LAB — HEMOGLOBIN A1C: Hgb A1c MFr Bld: 7.3 % — ABNORMAL HIGH (ref 4.6–6.5)

## 2011-06-30 MED ORDER — HYDROCODONE-ACETAMINOPHEN 5-500 MG PO TABS: 1.0000 | ORAL_TABLET | ORAL | Status: DC | PRN

## 2011-06-30 NOTE — Patient Instructions (Addendum)
blood tests are being ordered for you today.  please call [phone] to hear your test results.  You will be prompted to enter the 9-digit "MRN" number that appears at the top left of this page, followed by #.  Then you will hear the message. pending the test results, please increase the morning insulin to 22 units.  Please make a regular physical appointment in 3 months. check your blood sugar 2 times a day.  vary the time of day when you check, between before the 3 meals, and at bedtime.  also check if you have symptoms of your blood sugar being too high or too low.  please keep a record of the readings and bring it to your next appointment here.  please call us sooner if your blood sugar goes below 70, or if it stays over 200.

## 2011-06-30 NOTE — Progress Notes (Signed)
  Subjective:    Patient ID: [name], male    DOB: [birth year], 56 y.o.   MRN: [id number]  HPI Pt returns for f/u of insulin-requiring DM (1992).  He did not increase insulin as advised.  no cbg record, but states cbg's are well-controlled.  There is no trend throughout the day. Past Medical History  Diagnosis Date  . Allergy   . Diabetes mellitus   . Hypertension   . Diabetic neuropathy   . Herpes labialis   . Wart, venereal     Rectal    No past surgical history on file.  History   Social History  . Marital Status: Married    Spouse Name: N/A    Number of Children: N/A  . Years of Education: N/A   Occupational History  . Not on file.   Social History Main Topics  . Smoking status: Current Everyday Smoker -- 1.0 packs/day  . Smokeless tobacco: Not on file  . Alcohol Use: Not on file  . Drug Use: Not on file  . Sexually Active: Not on file   Other Topics Concern  . Not on file   Social History Narrative  . No narrative on file    Current Outpatient Prescriptions on File Prior to Visit  Medication Sig Dispense Refill  . albuterol (PROVENTIL) 90 MCG/ACT inhaler Inhale 2 puffs into the lungs every 6 (six) hours as needed.        . desloratadine (CLARINEX) 5 MG tablet Take 5 mg by mouth as needed.       . Insulin Pen Needle (RELION PEN NEEDLE 31G/8MM) 31G X 8 MM MISC Two times a day      . Insulin Syringe-Needle U-100 (BD INSULIN SYRINGE ULTRAFINE) 31G X 5/16" 1 ML MISC Two times a day      . lisinopril-hydrochlorothiazide (PRINZIDE,ZESTORETIC) 20-12.5 MG per tablet TAKE ONE TABLET BY MOUTH EVERY DAY  30 tablet  3  . metFORMIN (GLUCOPHAGE) 1000 MG tablet Take 1,000 mg by mouth 2 (two) times daily with a meal.        . pravastatin (PRAVACHOL) 40 MG tablet TAKE TWO TABLETS BY MOUTH EVERY DAY  60 tablet  5    No Known Allergies  No family history on file.  BP 118/62  Pulse 70  Temp(Src) 98.2 F (36.8 C) (Oral)  Ht 5\' 9"  (1.753 m)  Wt 181 lb (82.101 kg)   BMI 26.73 kg/m2  SpO2 97%    Review of Systems denies hypoglycemia    Objective:   Physical Exam VITAL SIGNS:  See vs page GENERAL: no distress Pulses: dorsalis pedis intact bilat.   Feet: no deformity.  no ulcer on the feet.  feet are of normal color and temp.  no edema.  There is bilateral onychomycosis Neuro: sensation is intact to touch on the feet        Assessment & Plan:  DM, therapy limited by noncompliance with cbg's and insulin dosing.  i'll do the best i can.

## 2011-07-01 DIAGNOSIS — Z0279 Encounter for issue of other medical certificate: Secondary | ICD-10-CM

## 2011-07-07 ENCOUNTER — Ambulatory Visit (INDEPENDENT_AMBULATORY_CARE_PROVIDER_SITE_OTHER): Payer: Medicare Other | Admitting: *Deleted

## 2011-07-07 DIAGNOSIS — Z23 Encounter for immunization: Secondary | ICD-10-CM

## 2011-08-27 ENCOUNTER — Other Ambulatory Visit: Payer: Self-pay | Admitting: Endocrinology

## 2011-09-14 ENCOUNTER — Other Ambulatory Visit: Payer: Self-pay | Admitting: Endocrinology

## 2011-10-24 ENCOUNTER — Other Ambulatory Visit (INDEPENDENT_AMBULATORY_CARE_PROVIDER_SITE_OTHER): Payer: Medicare Other

## 2011-10-24 ENCOUNTER — Ambulatory Visit (INDEPENDENT_AMBULATORY_CARE_PROVIDER_SITE_OTHER): Payer: Medicare Other | Admitting: Endocrinology

## 2011-10-24 ENCOUNTER — Encounter: Payer: Self-pay | Admitting: Endocrinology

## 2011-10-24 VITALS — BP 122/58 | HR 75 | Temp 97.8°F | Ht 69.0 in | Wt 195.0 lb

## 2011-10-24 DIAGNOSIS — M25519 Pain in unspecified shoulder: Secondary | ICD-10-CM

## 2011-10-24 DIAGNOSIS — E109 Type 1 diabetes mellitus without complications: Secondary | ICD-10-CM

## 2011-10-24 MED ORDER — HYDROCODONE-ACETAMINOPHEN 5-500 MG PO TABS: 1.0000 | ORAL_TABLET | ORAL | Status: DC | PRN

## 2011-10-24 NOTE — Progress Notes (Signed)
  Subjective:    Patient ID: [name], male    DOB: [birth year], 56 y.o.   MRN: [id number]  HPI Pt returns for f/u of insulin-requiring DM (1992).  no cbg record, but states cbg's are sometimes low in the afternoon.  It is highest in am (mid-100's). Pt reports 9 years of moderate pain at the left shoulder, but no assoc numbness.  Pt says ortho told him there was nothing more they could do for him.   Past Medical History  Diagnosis Date  . Allergy   . Diabetes mellitus   . Hypertension   . Diabetic neuropathy   . Herpes labialis   . Wart, venereal     Rectal    No past surgical history on file.  History   Social History  . Marital Status: Married    Spouse Name: N/A    Number of Children: N/A  . Years of Education: N/A   Occupational History  . Not on file.   Social History Main Topics  . Smoking status: Current Everyday Smoker -- 1.0 packs/day  . Smokeless tobacco: Not on file  . Alcohol Use: Not on file  . Drug Use: Not on file  . Sexually Active: Not on file   Other Topics Concern  . Not on file   Social History Narrative  . No narrative on file    Current Outpatient Prescriptions on File Prior to Visit  Medication Sig Dispense Refill  . albuterol (PROVENTIL) 90 MCG/ACT inhaler Inhale 2 puffs into the lungs every 6 (six) hours as needed.        . desloratadine (CLARINEX) 5 MG tablet Take 5 mg by mouth as needed.       . insulin NPH-insulin regular (NOVOLIN 70/30) (70-30) 100 UNIT/ML injection Inject 20 units each am and 10 units before evening meal      . Insulin Pen Needle (RELION PEN NEEDLE 31G/8MM) 31G X 8 MM MISC Two times a day      . Insulin Syringe-Needle U-100 (BD INSULIN SYRINGE ULTRAFINE) 31G X 5/16" 1 ML MISC Two times a day      . lisinopril-hydrochlorothiazide (PRINZIDE,ZESTORETIC) 20-12.5 MG per tablet TAKE ONE TABLET BY MOUTH EVERY DAY  30 tablet  3  . metFORMIN (GLUCOPHAGE) 1000 MG tablet TAKE ONE TABLET BY MOUTH TWICE DAILY  60 tablet  5  .  pravastatin (PRAVACHOL) 40 MG tablet TAKE TWO TABLETS BY MOUTH EVERY DAY  60 tablet  5    No Known Allergies  No family history on file.  BP 122/58  Pulse 75  Temp(Src) 97.8 F (36.6 C) (Oral)  Ht 5\' 9"  (1.753 m)  Wt 195 lb (88.451 kg)  BMI 28.80 kg/m2  SpO2 96%   Review of Systems Denies loc and weight change.    Objective:   Physical Exam VITAL SIGNS:  See vs page GENERAL: no distress Left shoulder: full rom, but abduction is limited to 35 degrees by pain LUE: Neuro: sensation is intact to touch, and motor 5/5   Lab Results  Component Value Date   HGBA1C 8.2* 10/24/2011      Assessment & Plan:  Left shoulder pain--he has developed chronic pain syndrome DM, The pattern of his cbg's indicates he needs some adjustment in his therapy

## 2011-10-24 NOTE — Patient Instructions (Addendum)
blood tests are being requested for you today.  You will receive a letter with results. Reduce insulin to 20 units each am and 10 units before evening meal Please make a regular physical appointment in 3 months. check your blood sugar 2 times a day.  vary the time of day when you check, between before the 3 meals, and at bedtime.  also check if you have symptoms of your blood sugar being too high or too low.  please keep a record of the readings and bring it to your next appointment here.  please call us sooner if your blood sugar goes below 70, or if it stays over 200.  Refer to a "PMR" specialist.  you will receive a phone call, about a day and time for an appointment. Here is a refill of your pain medication.   (see letter)

## 2011-10-25 ENCOUNTER — Encounter: Payer: Self-pay | Admitting: Endocrinology

## 2011-10-28 ENCOUNTER — Other Ambulatory Visit: Payer: Self-pay | Admitting: Endocrinology

## 2011-10-28 DIAGNOSIS — M25519 Pain in unspecified shoulder: Secondary | ICD-10-CM

## 2011-10-29 ENCOUNTER — Other Ambulatory Visit: Payer: Self-pay | Admitting: Endocrinology

## 2011-12-08 ENCOUNTER — Ambulatory Visit: Payer: Medicare Other | Admitting: Physical Medicine & Rehabilitation

## 2012-01-27 ENCOUNTER — Other Ambulatory Visit: Payer: Self-pay

## 2012-01-27 MED ORDER — INSULIN NPH ISOPHANE & REGULAR (70-30) 100 UNIT/ML ~~LOC~~ SUSP: SUBCUTANEOUS | Status: DC

## 2012-02-02 ENCOUNTER — Ambulatory Visit (INDEPENDENT_AMBULATORY_CARE_PROVIDER_SITE_OTHER): Payer: Medicare Other | Admitting: Endocrinology

## 2012-02-02 ENCOUNTER — Encounter: Payer: Self-pay | Admitting: Endocrinology

## 2012-02-02 ENCOUNTER — Other Ambulatory Visit (INDEPENDENT_AMBULATORY_CARE_PROVIDER_SITE_OTHER): Payer: Medicare Other

## 2012-02-02 VITALS — BP 112/52 | HR 70 | Temp 96.8°F | Ht 69.0 in | Wt 180.0 lb

## 2012-02-02 DIAGNOSIS — E7800 Pure hypercholesterolemia, unspecified: Secondary | ICD-10-CM

## 2012-02-02 DIAGNOSIS — E109 Type 1 diabetes mellitus without complications: Secondary | ICD-10-CM

## 2012-02-02 DIAGNOSIS — Z125 Encounter for screening for malignant neoplasm of prostate: Secondary | ICD-10-CM | POA: Insufficient documentation

## 2012-02-02 DIAGNOSIS — Z79899 Other long term (current) drug therapy: Secondary | ICD-10-CM

## 2012-02-02 DIAGNOSIS — Z0000 Encounter for general adult medical examination without abnormal findings: Secondary | ICD-10-CM

## 2012-02-02 DIAGNOSIS — I10 Essential (primary) hypertension: Secondary | ICD-10-CM

## 2012-02-02 LAB — MICROALBUMIN / CREATININE URINE RATIO
Creatinine,U: 84.5 mg/dL
Microalb Creat Ratio: 2.4 mg/g (ref 0.0–30.0)
Microalb, Ur: 2 mg/dL — ABNORMAL HIGH (ref 0.0–1.9)

## 2012-02-02 LAB — CBC WITH DIFFERENTIAL/PLATELET
Eosinophils Relative: 2.4 % (ref 0.0–5.0)
Lymphocytes Relative: 20.6 % (ref 12.0–46.0)
Monocytes Absolute: 0.8 10*3/uL (ref 0.1–1.0)
Monocytes Relative: 5 % (ref 3.0–12.0)
Neutrophils Relative %: 71.1 % (ref 43.0–77.0)
Platelets: 325 10*3/uL (ref 150.0–400.0)
WBC: 16.4 10*3/uL — ABNORMAL HIGH (ref 4.5–10.5)

## 2012-02-02 LAB — URINALYSIS, ROUTINE W REFLEX MICROSCOPIC
Bilirubin Urine: NEGATIVE
Hgb urine dipstick: NEGATIVE
Total Protein, Urine: NEGATIVE
Urine Glucose: NEGATIVE
Urobilinogen, UA: 0.2 (ref 0.0–1.0)

## 2012-02-02 LAB — BASIC METABOLIC PANEL
BUN: 16 mg/dL (ref 6–23)
CO2: 29 mEq/L (ref 19–32)
Calcium: 9.9 mg/dL (ref 8.4–10.5)
Chloride: 101 mEq/L (ref 96–112)
Creatinine, Ser: 1.1 mg/dL (ref 0.4–1.5)

## 2012-02-02 LAB — HEPATIC FUNCTION PANEL
Bilirubin, Direct: 0.1 mg/dL (ref 0.0–0.3)
Total Bilirubin: 0.6 mg/dL (ref 0.3–1.2)
Total Protein: 7.2 g/dL (ref 6.0–8.3)

## 2012-02-02 LAB — TSH: TSH: 1 u[IU]/mL (ref 0.35–5.50)

## 2012-02-02 LAB — LIPID PANEL
HDL: 34.3 mg/dL — ABNORMAL LOW (ref >39.00)
Total CHOL/HDL Ratio: 3
VLDL: 19.8 mg/dL (ref 0.0–40.0)

## 2012-02-02 MED ORDER — HYDROCODONE-ACETAMINOPHEN 5-500 MG PO TABS: 1.0000 | ORAL_TABLET | ORAL | Status: DC | PRN

## 2012-02-02 NOTE — Progress Notes (Signed)
Subjective:    Patient ID: [name], male    DOB: 1955-12-12, 56 y.o.   MRN: [id number]  HPI here for regular wellness examination.  He's feeling pretty well in general, and says chronic med probs are stable, except as noted below. Past Medical History  Diagnosis Date  . Allergy   . Diabetes mellitus   . Hypertension   . Diabetic neuropathy   . Herpes labialis   . Wart, venereal     Rectal    No past surgical history on file.  History   Social History  . Marital Status: Married    Spouse Name: N/A    Number of Children: N/A  . Years of Education: N/A   Occupational History  . Not on file.   Social History Main Topics  . Smoking status: Current Everyday Smoker -- 1.0 packs/day  . Smokeless tobacco: Not on file  . Alcohol Use: Not on file  . Drug Use: Not on file  . Sexually Active: Not on file   Other Topics Concern  . Not on file   Social History Narrative  . No narrative on file    Current Outpatient Prescriptions on File Prior to Visit  Medication Sig Dispense Refill  . albuterol (PROVENTIL) 90 MCG/ACT inhaler Inhale 2 puffs into the lungs every 6 (six) hours as needed.        . desloratadine (CLARINEX) 5 MG tablet Take 5 mg by mouth as needed.       Marland Kitchen HYDROcodone-acetaminophen (VICODIN) 5-500 MG per tablet Take 1 tablet by mouth every 4 (four) hours as needed for pain.  50 tablet  4  . insulin NPH-insulin regular (NOVOLIN 70/30) (70-30) 100 UNIT/ML injection Inject 20 units each am and 10 units before evening meal  10 mL  0  . Insulin Pen Needle (RELION PEN NEEDLE 31G/8MM) 31G X 8 MM MISC Two times a day      . Insulin Syringe-Needle U-100 (BD INSULIN SYRINGE ULTRAFINE) 31G X 5/16" 1 ML MISC Two times a day      . lisinopril-hydrochlorothiazide (PRINZIDE,ZESTORETIC) 20-12.5 MG per tablet TAKE ONE TABLET BY MOUTH EVERY DAY  30 tablet  5  . metFORMIN (GLUCOPHAGE) 1000 MG tablet TAKE ONE TABLET BY MOUTH TWICE DAILY  60 tablet  5  . pravastatin (PRAVACHOL) 40  MG tablet TAKE TWO TABLETS BY MOUTH EVERY DAY  60 tablet  5    No Known Allergies  No family history on file.  BP 112/52  Pulse 70  Temp 96.8 F (36 C) (Oral)  Ht 5\' 9"  (1.753 m)  Wt 180 lb (81.647 kg)  BMI 26.58 kg/m2  SpO2 95%    Review of Systems  Constitutional: Negative for unexpected weight change.  HENT: Negative for hearing loss.   Eyes: Negative for visual disturbance.  Respiratory: Negative for shortness of breath.   Cardiovascular: Negative for chest pain.  Gastrointestinal: Negative for anal bleeding.  Genitourinary: Negative for hematuria and difficulty urinating.  Musculoskeletal: Negative for back pain.  Skin: Negative for rash.  Neurological: Negative for numbness and headaches.  Hematological: Bruises/bleeds easily.  Psychiatric/Behavioral: Negative for dysphoric mood.       Objective:   Physical Exam VS: see vs page GEN: no distress HEAD: head: no deformity eyes: no periorbital swelling, no proptosis external nose and ears are normal mouth: no lesion seen NECK: supple, thyroid is not enlarged CHEST WALL: no deformity LUNGS: clear to auscultation BREASTS:  No gynecomastia CV: reg rate and  rhythm, no murmur ABD: abdomen is soft, nontender.  no hepatosplenomegaly.  not distended.  no hernia RECTAL: normal external and internal exam.  heme neg.  Multiple rectal warts. PROSTATE:  Normal size.  No nodule MUSCULOSKELETAL: muscle bulk and strength are grossly normal.  no obvious joint swelling.  gait is normal and steady NEURO:  cn 2-12 grossly intact.   readily moves all 4's.  sensation is intact to touch on the feet SKIN:  Normal texture and temperature.  No rash or suspicious lesion is visible.   NODES:  None palpable at the neck PSYCH: alert, oriented x3.  Does not appear anxious nor depressed.    Assessment & Plan:  Wellness visit today, with problems stable, except as noted.   SEPARATE EVALUATION FOLLOWS--EACH PROBLEM HERE IS NEW, NOT  RESPONDING TO TREATMENT, OR POSES SIGNIFICANT RISK TO THE PATIENT'S HEALTH: HISTORY OF THE PRESENT ILLNESS: Pt returns for f/u of dm.  no cbg record, but states cbg's are well-controlled. elev wbc are again noted.  Denies fever PAST MEDICAL HISTORY reviewed and up to date today REVIEW OF SYSTEMS: Denies LOC PHYSICAL EXAMINATION: VITAL SIGNS:  See vs page GENERAL: no distress EXTEMITIES: no deformity.  no ulcer on the feet.  feet are of normal color and temp.  no edema PULSES: dorsalis pedis intact bilat.  no carotid bruit LAB/XRAY RESULTS: IMPRESSION: Lab Results  Component Value Date   WBC 16.4* 02/02/2012   HGB 15.7 02/02/2012   HCT 46.6 02/02/2012   MCV 100.6* 02/02/2012   PLT 325.0 02/02/2012   Lab Results  Component Value Date   HGBA1C 7.1* 02/02/2012  PLAN:  We'll follow wbc educe the insulin to18 units with breakfast, and 10 units before evening meal.

## 2012-02-02 NOTE — Patient Instructions (Addendum)
blood tests are being requested for you today.  You will receive a letter with results. Please come back for a follow-up appointment in 4 months. please consider these measures for your health:  minimize alcohol.  do not use tobacco products.  have a colonoscopy at least every 10 years from age 54.  keep firearms safely stored.  always use seat belts.  have working smoke alarms in your home.  see an eye doctor and dentist regularly.  never drive under the influence of alcohol or drugs (including prescription drugs).  those with fair skin should take precautions against the sun.

## 2012-02-03 ENCOUNTER — Encounter: Payer: Self-pay | Admitting: Endocrinology

## 2012-02-04 ENCOUNTER — Telehealth: Payer: Self-pay | Admitting: *Deleted

## 2012-02-04 NOTE — Telephone Encounter (Signed)
Called pt to inform of lab results, pt informed (letter also mailed to pt). 

## 2012-03-03 ENCOUNTER — Other Ambulatory Visit: Payer: Self-pay | Admitting: Endocrinology

## 2012-03-22 ENCOUNTER — Telehealth: Payer: Self-pay | Admitting: Endocrinology

## 2012-03-22 NOTE — Telephone Encounter (Signed)
Please refill prn 

## 2012-03-22 NOTE — Telephone Encounter (Signed)
Wife is requesting insulin samples----pt is out of insulin---

## 2012-03-22 NOTE — Telephone Encounter (Signed)
The pt called the triage line and is hoping to get sample of insulin.  He states he is completely out of insulin.  Please call the patient back to let him know if we have samples and what his options are.  Thanks!

## 2012-03-22 NOTE — Telephone Encounter (Signed)
Notified pt no samples are available... 03/22/12@11 :04am/LMB

## 2012-03-22 NOTE — Telephone Encounter (Signed)
Per previous phone note, pt notified by Valentina Gu no samples of insulin available. Called pt to see if we can send an rx for his Novolin 70/30 to his pharmacy, no answer/unable to leave message.

## 2012-03-23 ENCOUNTER — Other Ambulatory Visit: Payer: Self-pay | Admitting: Endocrinology

## 2012-03-23 NOTE — Telephone Encounter (Signed)
Pt informed of MD's advisement. Pt states that he will refill rx for Novolin that he already has.

## 2012-03-25 ENCOUNTER — Other Ambulatory Visit (INDEPENDENT_AMBULATORY_CARE_PROVIDER_SITE_OTHER): Payer: Medicare Other

## 2012-03-25 ENCOUNTER — Ambulatory Visit (INDEPENDENT_AMBULATORY_CARE_PROVIDER_SITE_OTHER): Payer: Medicare Other | Admitting: Internal Medicine

## 2012-03-25 ENCOUNTER — Encounter: Payer: Self-pay | Admitting: Internal Medicine

## 2012-03-25 VITALS — BP 136/80 | HR 65 | Temp 97.1°F | Ht 71.0 in | Wt 177.4 lb

## 2012-03-25 DIAGNOSIS — R310 Gross hematuria: Secondary | ICD-10-CM

## 2012-03-25 DIAGNOSIS — I10 Essential (primary) hypertension: Secondary | ICD-10-CM

## 2012-03-25 DIAGNOSIS — J45909 Unspecified asthma, uncomplicated: Secondary | ICD-10-CM

## 2012-03-25 LAB — URINALYSIS, ROUTINE W REFLEX MICROSCOPIC
Leukocytes, UA: NEGATIVE
Specific Gravity, Urine: 1.025 (ref 1.000–1.030)
Urobilinogen, UA: 1 (ref 0.0–1.0)
pH: 5.5 (ref 5.0–8.0)

## 2012-03-25 NOTE — Patient Instructions (Addendum)
Continue all other medications as before There is no change in treatment today Please go to LAB in the Basement for the urine tests to be done today You will be contacted regarding the referral for: urology Please stop smoking

## 2012-03-25 NOTE — Progress Notes (Signed)
Subjective:    Patient ID: [name], male    DOB: 1956-04-08, 56 y.o.   MRN: [id number]  HPI  Here to c/o 5 days onset daily gross hematuria, most evident with first urination in the AM, without fever, pain, urgency, frequency, back pain, n/v, chills.  appetitie ok, no hx of renal stones.  Father with hx of renal stone.  No prior hx of hematuria, never has seen urology. Mentions only taking the AM shot of insulin due to cost and tight finances.  + hx of smoking - about 1 ppd for 45 yrs.  Pt denies chest pain, increased sob or doe, wheezing, orthopnea, PND, [name], palpitations, dizziness or syncope. Pt denies new neurological symptoms such as new headache, or facial or extremity weakness or numbness   Pt denies polydipsia, polyuria.  Pt denies fever, wt loss, night sweats, loss of appetite, or other constitutional symptoms. Denies worsening depressive symptoms, suicidal ideation, or panic, though has ongoing anxiety. Disabled since 2004 after fall with broken clavicle and left shoulder injury - rated at 10% only residual now Past Medical History  Diagnosis Date  . Allergy   . Diabetes mellitus   . Hypertension   . Diabetic neuropathy   . Herpes labialis   . Wart, venereal     Rectal  . SHOULDER PAIN, LEFT, CHRONIC 01/17/2008    Qualifier: Diagnosis of  By: Everardo All MD, Cleophas Dunker   . HYPERTENSION 03/06/2007    Qualifier: Diagnosis of  By: Tyrone Apple, Lucy    . HYPERCHOLESTEROLEMIA 10/06/2007    Qualifier: Diagnosis of  By: Everardo All MD, Cleophas Dunker   . DIABETES MELLITUS, TYPE I 03/06/2007    Qualifier: Diagnosis of  By: Charlsie Quest RMA, Lucy    . ASTHMA 07/25/2009    Qualifier: Diagnosis of  By: Everardo All MD, Cleophas Dunker   . ALLERGIC RHINITIS 03/06/2007    Qualifier: Diagnosis of  By: Charlsie Quest RMA, Lucy     No past surgical history on file.  reports that he has been smoking.  He does not have any smokeless tobacco history on file. His alcohol and drug histories not on file. family history is not on  file. No Known Allergies Current Outpatient Prescriptions on File Prior to Visit  Medication Sig Dispense Refill  . albuterol (PROVENTIL) 90 MCG/ACT inhaler Inhale 2 puffs into the lungs every 6 (six) hours as needed.        . desloratadine (CLARINEX) 5 MG tablet Take 5 mg by mouth as needed.       Marland Kitchen HYDROcodone-acetaminophen (VICODIN) 5-500 MG per tablet Take 1 tablet by mouth every 4 (four) hours as needed for pain.  50 tablet  4  . insulin NPH-insulin regular (NOVOLIN 70/30 RELION) (70-30) 100 UNIT/ML injection Inject 18 units with breakfast, and 10 units before evening meal  10 mL  5  . Insulin Pen Needle (RELION PEN NEEDLE 31G/8MM) 31G X 8 MM MISC Two times a day      . Insulin Syringe-Needle U-100 (BD INSULIN SYRINGE ULTRAFINE) 31G X 5/16" 1 ML MISC Two times a day      . lisinopril-hydrochlorothiazide (PRINZIDE,ZESTORETIC) 20-12.5 MG per tablet TAKE ONE TABLET BY MOUTH EVERY DAY  30 tablet  5  . metFORMIN (GLUCOPHAGE) 1000 MG tablet TAKE ONE TABLET BY MOUTH TWICE DAILY  60 tablet  5  . pravastatin (PRAVACHOL) 40 MG tablet TAKE TWO TABLETS BY MOUTH EVERY DAY  60 tablet  5   Review of Systems Review of  Systems  Constitutional: Negative for diaphoresis and unexpected weight change.  HENT: Negative for drooling and tinnitus.   Eyes: Negative for photophobia and visual disturbance.  Respiratory: Negative for choking and stridor.   Gastrointestinal: Negative for vomiting and blood in stool.  Genitourinary: Negative for hematuria and decreased urine volume.  Musculoskeletal: Negative for gait problem.  Skin: Negative for color change and wound.  Neurological: Negative for tremors and numbness.  Psychiatric/Behavioral: Negative for decreased concentration. The patient is not hyperactive.       Objective:   Physical Exam BP 136/80  Pulse 65  Temp 97.1 F (36.2 C) (Oral)  Ht 5\' 11"  (1.803 m)  Wt 177 lb 6 oz (80.457 kg)  BMI 24.74 kg/m2  SpO2 98% Physical Exam  VS  noted Constitutional: Pt appears well-developed and well-nourished.  HENT: Head: Normocephalic.  Right Ear: External ear normal.  Left Ear: External ear normal.  Eyes: Conjunctivae and EOM are normal. Pupils are equal, round, and reactive to light.  Neck: Normal range of motion. Neck supple.  Cardiovascular: Normal rate and regular rhythm.   Pulmonary/Chest: Effort normal and breath sounds normal.  Abd:  Soft, NT, non-distended, + BS Neurological: Pt is alert. Not confused Skin: Skin is warm. No erythema.  Psychiatric: Pt behavior is normal. Thought content normal.     Assessment & Plan:

## 2012-03-25 NOTE — Assessment & Plan Note (Signed)
No  Pain or fever, for urine studies today, but with smoking hx cant r/o GU tract malignancy - for urology referral

## 2012-03-27 ENCOUNTER — Encounter: Payer: Self-pay | Admitting: Internal Medicine

## 2012-03-27 NOTE — Assessment & Plan Note (Signed)
stable overall by hx and exam, most recent data reviewed with pt, and pt to continue medical treatment as before SpO2 Readings from Last 3 Encounters:  03/25/12 98%  02/02/12 95%  10/24/11 96%

## 2012-03-27 NOTE — Assessment & Plan Note (Signed)
stable overall by hx and exam, most recent data reviewed with pt, and pt to continue medical treatment as before BP Readings from Last 3 Encounters:  03/25/12 136/80  02/02/12 112/52  10/24/11 122/58

## 2012-03-29 ENCOUNTER — Encounter: Payer: Self-pay | Admitting: Internal Medicine

## 2012-04-22 ENCOUNTER — Other Ambulatory Visit: Payer: Self-pay | Admitting: Endocrinology

## 2012-04-22 ENCOUNTER — Other Ambulatory Visit: Payer: Self-pay | Admitting: Urology

## 2012-04-23 ENCOUNTER — Other Ambulatory Visit: Payer: Self-pay | Admitting: General Practice

## 2012-04-23 ENCOUNTER — Other Ambulatory Visit: Payer: Self-pay | Admitting: *Deleted

## 2012-04-23 MED ORDER — LISINOPRIL-HYDROCHLOROTHIAZIDE 20-12.5 MG PO TABS: 1.0000 | ORAL_TABLET | Freq: Every day | ORAL | Status: DC

## 2012-04-23 MED ORDER — PRAVASTATIN SODIUM 40 MG PO TABS: 80.0000 mg | ORAL_TABLET | Freq: Every day | ORAL | Status: DC

## 2012-04-23 MED ORDER — PRAVASTATIN SODIUM 40 MG PO TABS: 40.0000 mg | ORAL_TABLET | Freq: Every day | ORAL | Status: DC

## 2012-04-27 ENCOUNTER — Telehealth: Payer: Self-pay | Admitting: Endocrinology

## 2012-04-27 NOTE — Telephone Encounter (Addendum)
Called pt's wife, she wants to know when husband was last on Actos, checked record in Epic and [employer] EMR and can find no record of Actos in those charts. Will order paper chart. (Okay to leave on wife's VM once we find out ).

## 2012-04-27 NOTE — Telephone Encounter (Signed)
Patients wife needs to speak with someone about the patients medications

## 2012-04-28 NOTE — Telephone Encounter (Signed)
Paper chart order, per chart, pt was d/cd from [facility] on 12/09/2006 by SAE due to cost. Pt's spouse informed.

## 2012-05-14 ENCOUNTER — Telehealth: Payer: Self-pay | Admitting: Endocrinology

## 2012-05-14 NOTE — Telephone Encounter (Signed)
Pt is scheduled for surgery next Friday and surgical nurse recommended he call Dr. Everardo All to get cough meds before he comes in for the surgery. Should he take OTC meds or can we send something to his pharmacy? Wife requested we call pt back at his #. Please advise.

## 2012-05-14 NOTE — Telephone Encounter (Signed)
Please advise ov--tomorrow at [facility], or Monday with me

## 2012-05-14 NOTE — Telephone Encounter (Signed)
Pt notified, stated he would call in the morning to try to get an appt. At [facility].

## 2012-05-15 ENCOUNTER — Ambulatory Visit (INDEPENDENT_AMBULATORY_CARE_PROVIDER_SITE_OTHER): Payer: Medicare Other | Admitting: Family Medicine

## 2012-05-15 ENCOUNTER — Encounter: Payer: Self-pay | Admitting: Family Medicine

## 2012-05-15 VITALS — BP 140/70 | HR 68 | Temp 98.0°F | Wt 184.0 lb

## 2012-05-15 DIAGNOSIS — R05 Cough: Secondary | ICD-10-CM

## 2012-05-15 DIAGNOSIS — R059 Cough, unspecified: Secondary | ICD-10-CM

## 2012-05-15 MED ORDER — ALBUTEROL SULFATE HFA 108 (90 BASE) MCG/ACT IN AERS: 2.0000 | INHALATION_SPRAY | Freq: Four times a day (QID) | RESPIRATORY_TRACT | Status: DC | PRN

## 2012-05-15 MED ORDER — HYDROCODONE-HOMATROPINE 5-1.5 MG/5ML PO SYRP: 5.0000 mL | ORAL_SOLUTION | Freq: Three times a day (TID) | ORAL | Status: DC | PRN

## 2012-05-15 MED ORDER — AZITHROMYCIN 250 MG PO TABS: ORAL_TABLET | ORAL | Status: DC

## 2012-05-15 NOTE — Progress Notes (Signed)
Schedule for bladder surgery 05/21/12.  Cough started 2 days ago.  Worst at night.  Some fevers at night, breaks during the day.  Smoker.  Rhinorrhea and yellow sputum.  Laying down makes the cough worse.  Not much help with OTC meds.  Sugar ~140 on home checks.  No ear pain.  No ST.    Meds, vitals, and allergies reviewed.   ROS: See HPI.  Otherwise, noncontributory.  GEN: nad, alert and oriented HEENT: mucous membranes moist, tm w/o erythema, nasal exam w/o erythema, clear discharge noted,  OP with cobblestoning NECK: supple w/o LA CV: rrr.   PULM: coarse BS with occ scattered rhonci but no inc wob EXT: no edema SKIN: no acute rash

## 2012-05-15 NOTE — Patient Instructions (Signed)
Start the antibiotics today and use the inhaler and cough medicine.  Call Dr. Vernie Ammons about your cough early next week.  Take care.

## 2012-05-15 NOTE — Assessment & Plan Note (Signed)
Given the exam in this smoker, would start abx and use SABA/cough suppressant.  Pt to call Dr. Vernie Ammons about this condition.  Will notify PCP also as FYI.  Nontoxic, okay for outpatient f/u.

## 2012-05-17 ENCOUNTER — Encounter (HOSPITAL_BASED_OUTPATIENT_CLINIC_OR_DEPARTMENT_OTHER): Payer: Self-pay | Admitting: *Deleted

## 2012-05-17 NOTE — Progress Notes (Signed)
NPO AFTER MN. ARRIVES AT 1015. NEEDS ISTAT. CURRENT EKG IN EPIC AND CHART. ?CXR ASK MDA (PT COMING EARLIER DUE TO POSS. CXR). WILL BRING RESUE INHALER.

## 2012-05-20 NOTE — H&P (Signed)
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  Penile edema: This was first evaluated by Dr. Patsi Sears in 9/07. At that time is been present for 3 years. It had persisted. A CT scan revealed no abnormality of the pelvis. He reports having undergone what sounds like 3 surgeries for what he described as sweat gland but it sounds like he's referring to Hidradenitis suppurativa. He said he didn't have any problems with penile edema until he then underwent some form of plastic surgery.   Gross hematuria: He experienced gross hematuria with no sign of infection and has a 45-pack-year smoking history  Interval history: He has not seen any further hematuria.   Past Medical History Problems  1. History of  Esophageal Reflux 530.81 2. History of  Gout 274.9 3. History of  Hidradenitis Suppurativa 705.83 4. History of  Type 2 Diabetes Mellitus 250.00  Surgical History Problems  1. History of  Pilonidal Cyst Resection  Current Meds 1. Hydrocodone-Acetaminophen 5-500 MG Oral Tablet; Therapy: 15Mar2013 to 2. Lisinopril-Hydrochlorothiazide 20-12.5 MG Oral Tablet; Therapy: (Recorded:29Apr2009) to 3. MetFORMIN HCl 1000 MG Oral Tablet; Therapy: (Recorded:29Apr2009) to 4. NovoLIN 70/30 ReliOn (70-30) 100 UNIT/ML Subcutaneous Suspension; Therapy: 18Jun2013 to 5. Pravastatin Sodium 40 MG Oral Tablet; Therapy: (Recorded:29Apr2009) to  Allergies Medication  1. No Known Drug Allergies  Family History Problems  1. Paternal history of  Lung Cancer V16.1  Social History Problems  1. Caffeine Use 2. Former Smoker V15.82 1 ppd x 40 years 3. Marital History - Currently Married Denied  4. History of  Alcohol Use  Review of Systems Genitourinary, constitutional, skin, eye, otolaryngeal, hematologic/lymphatic, cardiovascular, pulmonary, endocrine, musculoskeletal, gastrointestinal, neurological and  psychiatric system(s) were reviewed and pertinent findings if present are noted.  Genitourinary: urinary frequency, nocturia, hematuria and penile edema.  Gastrointestinal: heartburn and diarrhea.  Integumentary: skin rash/lesion.  Eyes: blurred vision.  ENT: sinus problems.  Respiratory: cough.  Endocrine: polydipsia.  Musculoskeletal: back pain and joint pain.    Vitals Vital Signs  Blood Pressure: 136 / 75 Temperature: 97.6 F Heart Rate: 67  Physical Exam Constitutional: Well nourished and well developed . No acute distress.  ENT:. The ears and nose are normal in appearance.  Neck: The appearance of the neck is normal and no neck mass is present.  Pulmonary: No respiratory distress and normal respiratory rhythm and effort.  Cardiovascular: Heart rate and rhythm are normal . No peripheral edema.  Abdomen: The abdomen is soft and nontender. No masses are palpated. No CVA tenderness. No hernias are palpable. No hepatosplenomegaly noted.  Rectal: Rectal exam demonstrates normal sphincter tone, no tenderness and no masses. The prostate has no nodularity and is not tender. The left seminal vesicle is nonpalpable. The right seminal vesicle is nonpalpable. The perineum is normal on inspection.  Genitourinary: Examination of the penis demonstrates swelling, but no discharge, no masses, no lesions and a normal meatus. The penis is uncircumcised. The scrotum is without lesions. The right epididymis is palpably normal and non-tender. The left epididymis is palpably normal and non-tender. The right testis is non-tender and without masses. The left testis is non-tender and without masses.  Lymphatics: The femoral and inguinal nodes are not enlarged or tender.  Skin: Normal skin turgor, no visible rash and no visible skin lesions.  Neuro/Psych:. Mood and affect are appropriate.   Assessment Assessed  1. 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 Gross Hematuria  599.71 3. Edema Of The Penis 607.83       We have discussed the fact that it appears he has transitional cell carcinoma of the bladder. It involves the right posterior lateral wall, appears papillary and hopefully his superficial. Fortunately there was no abnormality of the upper tract. I have recommended further evaluation and treatment with transurethral resection of his bladder tumor. I've gone over the procedure with him in detail including its risk and complications, the alternatives and the probabilities of success. We discussed the use of mitomycin-C postoperatively. I went over the outpatient nature of the procedure.   Plan     He will be scheduled for TURBT with anticipated mitomycin-C intravesically postoperatively.

## 2012-05-21 ENCOUNTER — Encounter (HOSPITAL_BASED_OUTPATIENT_CLINIC_OR_DEPARTMENT_OTHER): Payer: Self-pay | Admitting: Anesthesiology

## 2012-05-21 ENCOUNTER — Encounter (HOSPITAL_BASED_OUTPATIENT_CLINIC_OR_DEPARTMENT_OTHER): Payer: Self-pay | Admitting: *Deleted

## 2012-05-21 ENCOUNTER — Encounter (HOSPITAL_BASED_OUTPATIENT_CLINIC_OR_DEPARTMENT_OTHER)
Admission: RE | Disposition: A | Discharge status: Home / Self Care | Payer: Self-pay | Source: Ambulatory Visit | Attending: Urology

## 2012-05-21 ENCOUNTER — Ambulatory Visit (HOSPITAL_BASED_OUTPATIENT_CLINIC_OR_DEPARTMENT_OTHER)
Admission: RE | Admit: 2012-05-21 | Discharge: 2012-05-21 | Disposition: A | Discharge status: Home / Self Care | Payer: Medicare Other | Source: Ambulatory Visit | Attending: Urology | Admitting: Urology

## 2012-05-21 ENCOUNTER — Ambulatory Visit (HOSPITAL_BASED_OUTPATIENT_CLINIC_OR_DEPARTMENT_OTHER): Payer: Medicare Other | Admitting: Anesthesiology

## 2012-05-21 DIAGNOSIS — C674 Malignant neoplasm of posterior wall of bladder: Secondary | ICD-10-CM | POA: Insufficient documentation

## 2012-05-21 DIAGNOSIS — E119 Type 2 diabetes mellitus without complications: Secondary | ICD-10-CM | POA: Insufficient documentation

## 2012-05-21 DIAGNOSIS — J45909 Unspecified asthma, uncomplicated: Secondary | ICD-10-CM | POA: Insufficient documentation

## 2012-05-21 DIAGNOSIS — C679 Malignant neoplasm of bladder, unspecified: Secondary | ICD-10-CM | POA: Diagnosis present

## 2012-05-21 DIAGNOSIS — Z79899 Other long term (current) drug therapy: Secondary | ICD-10-CM | POA: Insufficient documentation

## 2012-05-21 DIAGNOSIS — K219 Gastro-esophageal reflux disease without esophagitis: Secondary | ICD-10-CM | POA: Insufficient documentation

## 2012-05-21 HISTORY — DX: Gastro-esophageal reflux disease without esophagitis: K21.9

## 2012-05-21 HISTORY — DX: Simple chronic bronchitis: J41.0

## 2012-05-21 HISTORY — DX: Cough: R05

## 2012-05-21 HISTORY — DX: Other specified cough: R05.8

## 2012-05-21 HISTORY — PX: TRANSURETHRAL RESECTION OF BLADDER TUMOR: SHX2575

## 2012-05-21 LAB — POCT I-STAT 4, (NA,K, GLUC, HGB,HCT)
Glucose, Bld: 181 mg/dL — ABNORMAL HIGH (ref 70–99)
HCT: 46 % (ref 39.0–52.0)
Hemoglobin: 15.6 g/dL (ref 13.0–17.0)
Potassium: 4 mEq/L (ref 3.5–5.1)
Sodium: 140 mEq/L (ref 135–145)

## 2012-05-21 LAB — GLUCOSE, CAPILLARY: Glucose-Capillary: 183 mg/dL — ABNORMAL HIGH (ref 70–99)

## 2012-05-21 SURGERY — TURBT (TRANSURETHRAL RESECTION OF BLADDER TUMOR)
Anesthesia: General | Site: Bladder | Wound class: Clean Contaminated

## 2012-05-21 MED ORDER — MIDAZOLAM HCL 5 MG/5ML IJ SOLN
INTRAMUSCULAR | Status: DC | PRN
  Administered 2012-05-21: 2 mg via INTRAVENOUS

## 2012-05-21 MED ORDER — MITOMYCIN CHEMO FOR BLADDER INSTILLATION 40 MG
INTRAVENOUS | Status: DC | PRN
  Administered 2012-05-21: 40 mg via INTRAVESICAL

## 2012-05-21 MED ORDER — KETOROLAC TROMETHAMINE 30 MG/ML IJ SOLN
INTRAMUSCULAR | Status: DC | PRN
  Administered 2012-05-21: 30 mg via INTRAVENOUS

## 2012-05-21 MED ORDER — LACTATED RINGERS IV SOLN: INTRAVENOUS | Status: DC

## 2012-05-21 MED ORDER — FENTANYL CITRATE 0.05 MG/ML IJ SOLN
INTRAMUSCULAR | Status: DC | PRN
  Administered 2012-05-21: 100 ug via INTRAVENOUS
  Administered 2012-05-21 (×2): 25 ug via INTRAVENOUS
  Administered 2012-05-21: 50 ug via INTRAVENOUS
  Administered 2012-05-21 (×4): 25 ug via INTRAVENOUS

## 2012-05-21 MED ORDER — FENTANYL CITRATE 0.05 MG/ML IJ SOLN: 25.0000 ug | INTRAMUSCULAR | Status: DC | PRN

## 2012-05-21 MED ORDER — LACTATED RINGERS IV SOLN
INTRAVENOUS | Status: DC
  Administered 2012-05-21: 100 mL/h via INTRAVENOUS
  Administered 2012-05-21 (×2): via INTRAVENOUS

## 2012-05-21 MED ORDER — PHENAZOPYRIDINE HCL 200 MG PO TABS: 200.0000 mg | ORAL_TABLET | Freq: Three times a day (TID) | ORAL | Status: DC | PRN

## 2012-05-21 MED ORDER — PROMETHAZINE HCL 25 MG/ML IJ SOLN: 6.2500 mg | INTRAMUSCULAR | Status: DC | PRN

## 2012-05-21 MED ORDER — CIPROFLOXACIN IN D5W 400 MG/200ML IV SOLN: INTRAVENOUS | Status: DC | PRN

## 2012-05-21 MED ORDER — ONDANSETRON HCL 4 MG/2ML IJ SOLN
INTRAMUSCULAR | Status: DC | PRN
  Administered 2012-05-21: 4 mg via INTRAVENOUS

## 2012-05-21 MED ORDER — CIPROFLOXACIN IN D5W 400 MG/200ML IV SOLN
400.0000 mg | INTRAVENOUS | Status: AC
  Administered 2012-05-21: 400 mg via INTRAVENOUS

## 2012-05-21 MED ORDER — SODIUM CHLORIDE 0.9 % IR SOLN
Status: DC | PRN
  Administered 2012-05-21: 9000 mL

## 2012-05-21 MED ORDER — PHENAZOPYRIDINE HCL 200 MG PO TABS
200.0000 mg | ORAL_TABLET | Freq: Three times a day (TID) | ORAL | Status: DC
  Administered 2012-05-21: 200 mg via ORAL

## 2012-05-21 MED ORDER — LIDOCAINE HCL (CARDIAC) 20 MG/ML IV SOLN
INTRAVENOUS | Status: DC | PRN
  Administered 2012-05-21: 100 mg via INTRAVENOUS

## 2012-05-21 MED ORDER — MITOMYCIN CHEMO FOR BLADDER INSTILLATION 40 MG
40.0000 mg | Freq: Once | INTRAVENOUS | Status: DC
  Filled 2012-05-21: qty 40

## 2012-05-21 MED ORDER — PROPOFOL 10 MG/ML IV BOLUS
INTRAVENOUS | Status: DC | PRN
  Administered 2012-05-21: 200 mg via INTRAVENOUS

## 2012-05-21 MED ORDER — HYDROCODONE-ACETAMINOPHEN 10-325 MG PO TABS: 1.0000 | ORAL_TABLET | Freq: Four times a day (QID) | ORAL | Status: DC | PRN

## 2012-05-21 MED ORDER — EPHEDRINE SULFATE 50 MG/ML IJ SOLN
INTRAMUSCULAR | Status: DC | PRN
  Administered 2012-05-21 (×2): 10 mg via INTRAVENOUS

## 2012-05-21 SURGICAL SUPPLY — 33 items
BAG DRAIN URO-CYSTO SKYTR STRL (DRAIN) ×2 IMPLANT
BAG DRN ANRFLXCHMBR STRAP LEK (BAG)
BAG DRN UROCATH (DRAIN) ×1
BAG URINE DRAINAGE (UROLOGICAL SUPPLIES) IMPLANT
BAG URINE LEG 19OZ MD ST LTX (BAG) IMPLANT
CANISTER SUCT LVC 12 LTR MEDI- (MISCELLANEOUS) ×1 IMPLANT
CATH FOLEY 2WAY SLVR  5CC 20FR (CATHETERS)
CATH FOLEY 2WAY SLVR  5CC 22FR (CATHETERS)
CATH FOLEY 2WAY SLVR  5CC 24FR (CATHETERS) ×1
CATH FOLEY 2WAY SLVR 5CC 20FR (CATHETERS) IMPLANT
CATH FOLEY 2WAY SLVR 5CC 22FR (CATHETERS) IMPLANT
CATH FOLEY 2WAY SLVR 5CC 24FR (CATHETERS) ×1 IMPLANT
CLOTH BEACON ORANGE TIMEOUT ST (SAFETY) ×2 IMPLANT
DRAPE CAMERA CLOSED 9X96 (DRAPES) ×2 IMPLANT
ELECT BUTTON HF 24-28F 2 30DE (ELECTRODE) IMPLANT
ELECT LOOP MED HF 24F 12D (CUTTING LOOP) IMPLANT
ELECT LOOP MED HF 24F 12D CBL (CLIP) ×1 IMPLANT
ELECT REM PT RETURN 9FT ADLT (ELECTROSURGICAL)
ELECT RESECT VAPORIZE 12D CBL (ELECTRODE) ×2 IMPLANT
ELECTRODE REM PT RTRN 9FT ADLT (ELECTROSURGICAL) ×1 IMPLANT
EVACUATOR MICROVAS BLADDER (UROLOGICAL SUPPLIES) ×1 IMPLANT
GLOVE BIO SURGEON STRL SZ8 (GLOVE) ×2 IMPLANT
GLOVE BIOGEL PI IND STRL 6.5 (GLOVE) IMPLANT
GLOVE BIOGEL PI INDICATOR 6.5 (GLOVE) ×2
GOWN PREVENTION PLUS LG XLONG (DISPOSABLE) ×3 IMPLANT
GOWN STRL REIN XL XLG (GOWN DISPOSABLE) ×2 IMPLANT
HOLDER FOLEY CATH W/STRAP (MISCELLANEOUS) IMPLANT
IV NS IRRIG 3000ML ARTHROMATIC (IV SOLUTION) ×2 IMPLANT
KIT ASPIRATION TUBING (SET/KITS/TRAYS/PACK) ×1 IMPLANT
PACK CYSTOSCOPY (CUSTOM PROCEDURE TRAY) ×2 IMPLANT
PLUG CATH AND CAP STER (CATHETERS) IMPLANT
SET ASPIRATION TUBING (TUBING) IMPLANT
WATER STERILE IRR 3000ML UROMA (IV SOLUTION) IMPLANT

## 2012-05-21 NOTE — Interval H&P Note (Signed)
History and Physical Interval Note:  05/21/2012 12:28 PM  [name]  has presented today for surgery, with the diagnosis of bladder tumor  The various methods of treatment have been discussed with the patient and family. After consideration of risks, benefits and other options for treatment, the patient has consented to  Procedure(s) (LRB) with comments: TRANSURETHRAL RESECTION OF BLADDER TUMOR (TURBT) (N/A) - gyrus needed instillation mytomycin c   patient is diabetic as a surgical intervention .  The patient's history has been reviewed, patient examined, no change in status, stable for surgery.  I have reviewed the patient's chart and labs.  Questions were answered to the patient's satisfaction.     [place]

## 2012-05-21 NOTE — Transfer of Care (Signed)
Immediate Anesthesia Transfer of Care Note  Patient: [name]  Procedure(s) Performed: Procedure(s) (LRB): TRANSURETHRAL RESECTION OF BLADDER TUMOR (TURBT) (N/A)  Patient Location: PACU  Anesthesia Type: General  Level of Consciousness: awake, sedated, patient cooperative and responds to stimulation  Airway & Oxygen Therapy: Patient Spontanous Breathing and Patient connected to face mask oxygen  Post-op Assessment: Report given to PACU RN, Post -op Vital signs reviewed and stable and Patient moving all extremities  Post vital signs: Reviewed and stable  Complications: No apparent anesthesia complications

## 2012-05-21 NOTE — Op Note (Signed)
PATIENT:  [name]  PRE-OPERATIVE DIAGNOSIS: Bladder tumor  POST-OPERATIVE DIAGNOSIS: Same  PROCEDURE:  Procedure(s): TRANSURETHRAL RESECTION OF BLADDER TUMOR (TURBT) (1.5cm.)  SURGEON:  Surgeon(s): Garnett Farm  ANESTHESIA:   General  EBL:  Minimal  DRAINS: Urinary Catheter (20 Fr. Foley)   SPECIMEN:  Source of Specimen:  Bladder tumor  DISPOSITION OF SPECIMEN:  PATHOLOGY  Indication: [name] is a 56 year old male who experienced gross hematuria. He has a 45-pack-year smoking history. Upper tract evaluation with CT scan was negative. Cystoscopic evaluation revealed a bladder tumor on the right posterior wall. He is brought to the operating room today for resection of the tumor.  Description of operation: The patient was taken to the operating room and administered general anesthesia. He was then placed on the table and moved to the dorsal lithotomy position after which his genitalia was sterilely prepped and draped. An official timeout was then performed.  The 26 French resectoscope with Timberlake obturator was then introduced into the bladder and the obturator was removed. The resectoscope element with 12 lens was then inserted and the bladder was fully and systematically inspected. Ureteral orifices were noted to be in the normal anatomic positions. The tumor was noted to be solitary, papillary and located on the right posterior wall laterally.  I first began by resecting the tumor down to the wall of the bladder and fulgurated the surrounding mucosa as well as base. Reinspection of the bladder revealed all obvious tumor had been fully resected and there was no evidence of perforation. The Microvasive evacuator was then used to irrigate the bladder and remove all of the portions of bladder tumor which were sent to pathology. I then removed the resectoscope.  A 20 French Foley catheter was then inserted in the bladder and irrigated. The irrigant returned slightly pink with  no clots. The patient was awakened and taken to the recovery room.  While in the recovery room 40 mg of mitomycin-C in 40 cc of water were instilled in the bladder through the catheter and the catheter was plugged. This will remain indwelling for approximately one hour. It will then be drained from the bladder and the catheter will be removed and the patient discharged home.  PLAN OF CARE: Discharge to home after PACU  PATIENT DISPOSITION:  PACU - hemodynamically stable.

## 2012-05-21 NOTE — Anesthesia Postprocedure Evaluation (Signed)
Anesthesia Post Note  Patient: [name]  Procedure(s) Performed: Procedure(s) (LRB): TRANSURETHRAL RESECTION OF BLADDER TUMOR (TURBT) (N/A)  Anesthesia type: General  Patient location: PACU  Post pain: Pain level controlled  Post assessment: Post-op Vital signs reviewed  Last Vitals: BP 183/78  Pulse 88  Temp 36.3 C (Oral)  Resp 16  Ht 5\' 11"  (1.803 m)  Wt 183 lb (83.008 kg)  BMI 25.52 kg/m2  SpO2 99%  Post vital signs: Reviewed  Level of consciousness: sedated  Complications: No apparent anesthesia complications

## 2012-05-21 NOTE — Anesthesia Preprocedure Evaluation (Signed)
Anesthesia Evaluation  Patient identified by MRN, date of birth, ID band Patient awake    Reviewed: Allergy & Precautions, H&P , NPO status , Patient's Chart, lab work & pertinent test results  Airway Mallampati: II TM Distance: >3 FB Neck ROM: Full    Dental  (+) Poor Dentition, Partial Upper, Partial Lower and Dental Advisory Given   Pulmonary asthma , Recent URI , Current Smoker,    + wheezing      Cardiovascular hypertension, Pt. on medications Rhythm:Regular Rate:Normal     Neuro/Psych negative neurological ROS  negative psych ROS   GI/Hepatic Neg liver ROS, GERD-  ,  Endo/Other  diabetes, Well Controlled, Type 1, Insulin Dependent and Oral Hypoglycemic Agents  Renal/GU negative Renal ROS  negative genitourinary   Musculoskeletal negative musculoskeletal ROS (+)   Abdominal   Peds  Hematology negative hematology ROS (+)   Anesthesia Other Findings   Reproductive/Obstetrics negative OB ROS                           Anesthesia Physical Anesthesia Plan  ASA: III  Anesthesia Plan: General   Post-op Pain Management:    Induction: Intravenous  Airway Management Planned: LMA  Additional Equipment:   Intra-op Plan:   Post-operative Plan: Extubation in OR  Informed Consent: I have reviewed the patients History and Physical, chart, labs and discussed the procedure including the risks, benefits and alternatives for the proposed anesthesia with the patient or authorized representative who has indicated his/her understanding and acceptance.   Dental advisory given  Plan Discussed with: CRNA  Anesthesia Plan Comments:         Anesthesia Quick Evaluation

## 2012-05-21 NOTE — Anesthesia Procedure Notes (Signed)
Procedure Name: LMA Insertion Date/Time: 05/21/2012 12:41 PM Performed by: Jessica Priest Pre-anesthesia Checklist: Patient identified, Emergency Drugs available, Suction available and Patient being monitored Patient Re-evaluated:Patient Re-evaluated prior to inductionOxygen Delivery Method: Circle System Utilized Preoxygenation: Pre-oxygenation with 100% oxygen Intubation Type: IV induction Ventilation: Mask ventilation without difficulty LMA: LMA inserted LMA Size: 5.0 Number of attempts: 1 Airway Equipment and Method: bite block Placement Confirmation: positive ETCO2 Tube secured with: Tape Dental Injury: Teeth and Oropharynx as per pre-operative assessment

## 2012-05-24 ENCOUNTER — Encounter (HOSPITAL_BASED_OUTPATIENT_CLINIC_OR_DEPARTMENT_OTHER): Payer: Self-pay | Admitting: Urology

## 2012-06-08 ENCOUNTER — Ambulatory Visit (INDEPENDENT_AMBULATORY_CARE_PROVIDER_SITE_OTHER): Payer: Medicare Other | Admitting: Endocrinology

## 2012-06-08 ENCOUNTER — Encounter: Payer: Self-pay | Admitting: Endocrinology

## 2012-06-08 VITALS — BP 128/76 | HR 90 | Temp 98.2°F | Wt 185.0 lb

## 2012-06-08 DIAGNOSIS — E109 Type 1 diabetes mellitus without complications: Secondary | ICD-10-CM

## 2012-06-08 DIAGNOSIS — Z23 Encounter for immunization: Secondary | ICD-10-CM

## 2012-06-08 LAB — HEMOGLOBIN A1C: Hgb A1c MFr Bld: 6.9 % — ABNORMAL HIGH (ref <5.7)

## 2012-06-08 MED ORDER — HYDROCODONE-ACETAMINOPHEN 5-500 MG PO TABS: 1.0000 | ORAL_TABLET | ORAL | Status: DC | PRN

## 2012-06-08 NOTE — Patient Instructions (Addendum)
check your blood sugar twice a day.  vary the time of day when you check, between before the 3 meals, and at bedtime.  also check if you have symptoms of your blood sugar being too high or too low.  please keep a record of the readings and bring it to your next appointment here.  please call us sooner if your blood sugar goes below 70, or if you have a lot of readings over 200.   blood tests are being requested for you today.  You will be contacted with results.   Please come back for a follow-up appointment in 3 months Please call if you want me to prescribe for you any quit-smoking medication.   Please come back for a follow-up appointment in 3 months.  Smoking Cessation Quitting smoking is important to your health and has many advantages. However, it is not always easy to quit since nicotine is a very addictive drug. Often times, people try 3 times or more before being able to quit. This document explains the best ways for you to prepare to quit smoking. Quitting takes hard work and a lot of effort, but you can do it. ADVANTAGES OF QUITTING SMOKING  You will live longer, feel better, and live better.   Your body will feel the impact of quitting smoking almost immediately.   Within 20 minutes, blood pressure decreases. Your pulse returns to its normal level.   After 8 hours, carbon monoxide levels in the blood return to normal. Your oxygen level increases.   After 24 hours, the chance of having a heart attack starts to decrease. Your breath, hair, and body stop smelling like smoke.   After 48 hours, damaged nerve endings begin to recover. Your sense of taste and smell improve.   After 72 hours, the body is virtually free of nicotine. Your bronchial tubes relax and breathing becomes easier.   After 2 to 12 weeks, lungs can hold more air. Exercise becomes easier and circulation improves.   The risk of having a heart attack, stroke, cancer, or lung disease is greatly reduced.   After 1  year, the risk of coronary heart disease is cut in half.   After 5 years, the risk of stroke falls to the same as a nonsmoker.   After 10 years, the risk of lung cancer is cut in half and the risk of other cancers decreases significantly.   After 15 years, the risk of coronary heart disease drops, usually to the level of a nonsmoker.   If you are pregnant, quitting smoking will improve your chances of having a healthy baby.   The people you live with, especially any children, will be healthier.   You will have extra money to spend on things other than cigarettes.  QUESTIONS TO THINK ABOUT BEFORE ATTEMPTING TO QUIT You may want to talk about your answers with your caregiver.  Why do you want to quit?   If you tried to quit in the past, what helped and what did not?   What will be the most difficult situations for you after you quit? How will you plan to handle them?   Who can help you through the tough times? Your family? Friends? A caregiver?   What pleasures do you get from smoking? What ways can you still get pleasure if you quit?  Here are some questions to ask your caregiver:  How can you help me to be successful at quitting?   What medicine do you  think would be best for me and how should I take it?   What should I do if I need more help?   What is smoking withdrawal like? How can I get information on withdrawal?  GET READY  Set a quit date.   Change your environment by getting rid of all cigarettes, ashtrays, matches, and lighters in your home, car, or work. Do not let people smoke in your home.   Review your past attempts to quit. Think about what worked and what did not.  GET SUPPORT AND ENCOURAGEMENT You have a better chance of being successful if you have help. You can get support in many ways.  Tell your family, friends, and co-workers that you are going to quit and need their support. Ask them not to smoke around you.   Get individual, group, or telephone  counseling and support. Programs are available at [employer] and health centers. Call your local health department for information about programs in your area.   Spiritual beliefs and practices may help some smokers quit.   Download a "quit meter" on your computer to keep track of quit statistics, such as how long you have gone without smoking, cigarettes not smoked, and money saved.   Get a self-help book about quitting smoking and staying off of tobacco.  LEARN NEW SKILLS AND BEHAVIORS  Distract yourself from urges to smoke. Talk to someone, go for a walk, or occupy your time with a task.   Change your normal routine. Take a different route to work. Drink tea instead of coffee. Eat breakfast in a different place.   Reduce your stress. Take a hot bath, exercise, or read a book.   Plan something enjoyable to do every day. Reward yourself for not smoking.   Explore interactive web-based programs that specialize in helping you quit.  GET MEDICINE AND USE IT CORRECTLY Medicines can help you stop smoking and decrease the urge to smoke. Combining medicine with the above behavioral methods and support can greatly increase your chances of successfully quitting smoking.  Nicotine replacement therapy helps deliver nicotine to your body without the negative effects and risks of smoking. Nicotine replacement therapy includes nicotine gum, lozenges, inhalers, nasal sprays, and skin patches. Some may be available over-the-counter and others require a prescription.   Antidepressant medicine helps people abstain from smoking, but how this works is unknown. This medicine is available by prescription.   Nicotinic receptor partial agonist medicine simulates the effect of nicotine in your brain. This medicine is available by prescription.  Ask your caregiver for advice about which medicines to use and how to use them based on your health history. Your caregiver will tell you what side effects to look out  for if you choose to be on a medicine or therapy. Carefully read the information on the package. Do not use any other product containing nicotine while using a nicotine replacement product.   RELAPSE OR DIFFICULT SITUATIONS Most relapses occur within the first 3 months after quitting. Do not be discouraged if you start smoking again. Remember, most people try several times before finally quitting. You may have symptoms of withdrawal because your body is used to nicotine. You may crave cigarettes, be irritable, feel very hungry, cough often, get headaches, or have difficulty concentrating. The withdrawal symptoms are only temporary. They are strongest when you first quit, but they will go away within 10 14 days. To reduce the chances of relapse, try to:  Avoid drinking alcohol. Drinking lowers  your chances of successfully quitting.   Reduce the amount of caffeine you consume. Once you quit smoking, the amount of caffeine in your body increases and can give you symptoms, such as a rapid heartbeat, sweating, and anxiety.   Avoid smokers because they can make you want to smoke.   Do not let weight gain distract you. Many smokers will gain weight when they quit, usually less than 10 pounds. Eat a healthy diet and stay active. You can always lose the weight gained after you quit.   Find ways to improve your mood other than smoking.  FOR MORE INFORMATION   www.smokefree.gov   Document Released: 07/22/2001 Document Revised: 01/27/2012 Document Reviewed: 11/06/2011 [facility] Patient Information 2013 [place], [place].

## 2012-06-08 NOTE — Progress Notes (Signed)
Subjective:    Patient ID: [name], male    DOB: 09-Jan-1956, 56 y.o.   MRN: [id number]  HPI Pt returns for f/u of insulin-requiring DM (dx'ed 1992; no known complications; therapy limited by pt's need for a simple, inexpensive regimen).  no cbg record, but states cbg's are well-controlled.  There is no trend throughout the day.  Past Medical History  Diagnosis Date  . Hypertension   . Diabetic neuropathy   . Herpes labialis   . Wart, venereal     Rectal  . SHOULDER PAIN, LEFT, CHRONIC 01/17/2008    Qualifier: Diagnosis of  By: Everardo All MD, Cleophas Dunker   . HYPERTENSION 03/06/2007    Qualifier: Diagnosis of  By: Tyrone Apple, Lucy    . HYPERCHOLESTEROLEMIA 10/06/2007    Qualifier: Diagnosis of  By: Everardo All MD, Cleophas Dunker   . ALLERGIC RHINITIS 03/06/2007    Qualifier: Diagnosis of  By: Charlsie Quest RMA, Lucy    . Type II diabetes mellitus 03/06/2007    Qualifier: Diagnosis of  By: Charlsie Quest RMA, Lucy    . Smokers' cough   . Productive cough   . Bladder tumor   . GERD (gastroesophageal reflux disease)     Past Surgical History  Procedure Date  . Laser ablation of penile and perianal warts 07-29-2007  DR GROSS  . Pilonidal cyst excision X2  . Sweat glands removed axillary and perianal 1999  . Left shoulder surgery 2003  . Cataract extraction w/ intraocular lens implant     RIGHT EYE  . Transurethral resection of bladder tumor 05/21/2012    Procedure: TRANSURETHRAL RESECTION OF BLADDER TUMOR (TURBT);  Surgeon: Garnett Farm, MD;  Location: [facility];  Service: Urology;  Laterality: N/A;   instillation mytomycin c @ 1320      History   Social History  . Marital Status: Married    Spouse Name: N/A    Number of Children: N/A  . Years of Education: N/A   Occupational History  . Not on file.   Social History Main Topics  . Smoking status: Current Every Day Smoker -- 1.0 packs/day for 40 years  . Smokeless tobacco: Never Used  . Alcohol Use: No  . Drug Use: No  . Sexually  Active: Not on file   Other Topics Concern  . Not on file   Social History Narrative  . No narrative on file    Current Outpatient Prescriptions on File Prior to Visit  Medication Sig Dispense Refill  . albuterol (PROVENTIL HFA;VENTOLIN HFA) 108 (90 BASE) MCG/ACT inhaler Inhale 2 puffs into the lungs every 6 (six) hours as needed for wheezing.  1 Inhaler  0  . azithromycin (ZITHROMAX) 250 MG tablet Take 500 mg by mouth daily. 2 tabs today and then 1 tab for 4 days      . calcium carbonate (TUMS - DOSED IN MG ELEMENTAL CALCIUM) 500 MG chewable tablet Chew 1 tablet by mouth daily.      Marland Kitchen desloratadine (CLARINEX) 5 MG tablet Take 5 mg by mouth as needed.       Marland Kitchen HYDROcodone-acetaminophen (NORCO) 10-325 MG per tablet Take 1 tablet by mouth every 6 (six) hours as needed for pain.  30 tablet  0  . HYDROcodone-homatropine (HYCODAN) 5-1.5 MG/5ML syrup Take 5 mLs by mouth every 8 (eight) hours as needed for cough.  120 mL  0  . insulin NPH-insulin regular (NOVOLIN 70/30) (70-30) 100 UNIT/ML injection Inject into the skin 2 (two) times  daily with a meal. Inject 18 units with breakfast, and 8 units before evening meal      . lisinopril-hydrochlorothiazide (PRINZIDE,ZESTORETIC) 20-12.5 MG per tablet Take 1 tablet by mouth every morning.      . metFORMIN (GLUCOPHAGE) 1000 MG tablet TAKE ONE TABLET BY MOUTH TWICE DAILY  60 tablet  5  . phenazopyridine (PYRIDIUM) 200 MG tablet Take 1 tablet (200 mg total) by mouth 3 (three) times daily as needed for pain.  30 tablet  0  . pravastatin (PRAVACHOL) 40 MG tablet Take 40 mg by mouth 2 (two) times daily.        No Known Allergies  No family history on file.  BP 128/76  Pulse 90  Temp 98.2 F (36.8 C) (Oral)  Wt 185 lb (83.915 kg)  SpO2 96%  Review of Systems denies hypoglycemia    Objective:   Physical Exam VITAL SIGNS:  See vs page GENERAL: no distress SKIN:  Insulin injection sites at the anterior abdomen are normal    Lab Results    Component Value Date   HGBA1C 6.9* 06/08/2012      Assessment & Plan:  DM, overcontrolled, given this regimen, which does match insulin to his changing needs throughout the day.   We spent at least 3 minutes discussing smoking cessation--see below.

## 2012-07-07 ENCOUNTER — Telehealth: Payer: Self-pay | Admitting: Endocrinology

## 2012-07-07 NOTE — Telephone Encounter (Signed)
Caller: Sandra/; Phone: [phone]; Reason for Call: Patient has rx for Vicodin 5/500, 1 tab q 4h prn pain, #50, RF x 4.  [facility] states manufacturer no longer makes 5/500, and would like to switch Rx for vicodin 5/325.  Info to office for provider review/Rx change/callback.

## 2012-07-12 MED ORDER — HYDROCODONE-ACETAMINOPHEN 10-325 MG PO TABS: 1.0000 | ORAL_TABLET | Freq: Four times a day (QID) | ORAL | Status: DC | PRN

## 2012-07-12 NOTE — Telephone Encounter (Signed)
Pt advised and will p/u rx

## 2012-07-12 NOTE — Telephone Encounter (Signed)
i printed 

## 2012-08-26 ENCOUNTER — Other Ambulatory Visit: Payer: Self-pay | Admitting: Endocrinology

## 2012-09-06 ENCOUNTER — Ambulatory Visit: Payer: Medicare Other | Admitting: Endocrinology

## 2012-09-06 ENCOUNTER — Ambulatory Visit (INDEPENDENT_AMBULATORY_CARE_PROVIDER_SITE_OTHER): Payer: Medicare Other | Admitting: Endocrinology

## 2012-09-06 VITALS — BP 130/80 | HR 81 | Wt 189.0 lb

## 2012-09-06 DIAGNOSIS — E109 Type 1 diabetes mellitus without complications: Secondary | ICD-10-CM

## 2012-09-06 NOTE — Patient Instructions (Addendum)
check your blood sugar twice a day.  vary the time of day when you check, between before the 3 meals, and at bedtime.  also check if you have symptoms of your blood sugar being too high or too low.  please keep a record of the readings and bring it to your next appointment here.  please call us sooner if your blood sugar goes below 70, or if you have a lot of readings over 200.  blood tests are being requested for you today.  We'll contact you with results.   Please come back for a follow-up appointment in 3 months.   

## 2012-09-06 NOTE — Progress Notes (Signed)
Subjective:    Patient ID: [name], male    DOB: 09-May-1956, 57 y.o.   MRN: [id number]  HPI Pt returns for f/u of insulin-requiring DM (dx'ed 1992; no known complications; therapy limited by pt's need for a simple, inexpensive regimen).  no cbg record, but states cbg's are well-controlled.  There is no trend throughout the day.   Past Medical History  Diagnosis Date  . Hypertension   . Diabetic neuropathy   . Herpes labialis   . Wart, venereal     Rectal  . SHOULDER PAIN, LEFT, CHRONIC 01/17/2008    Qualifier: Diagnosis of  By: Everardo All MD, Cleophas Dunker   . HYPERTENSION 03/06/2007    Qualifier: Diagnosis of  By: Tyrone Apple, Lucy    . HYPERCHOLESTEROLEMIA 10/06/2007    Qualifier: Diagnosis of  By: Everardo All MD, Cleophas Dunker   . ALLERGIC RHINITIS 03/06/2007    Qualifier: Diagnosis of  By: Charlsie Quest RMA, Lucy    . Type II diabetes mellitus 03/06/2007    Qualifier: Diagnosis of  By: Charlsie Quest RMA, Lucy    . Smokers' cough   . Productive cough   . Bladder tumor   . GERD (gastroesophageal reflux disease)     Past Surgical History  Procedure Date  . Laser ablation of penile and perianal warts 07-29-2007  DR GROSS  . Pilonidal cyst excision X2  . Sweat glands removed axillary and perianal 1999  . Left shoulder surgery 2003  . Cataract extraction w/ intraocular lens implant     RIGHT EYE  . Transurethral resection of bladder tumor 05/21/2012    Procedure: TRANSURETHRAL RESECTION OF BLADDER TUMOR (TURBT);  Surgeon: Garnett Farm, MD;  Location: [facility];  Service: Urology;  Laterality: N/A;   instillation mytomycin c @ 1320      History   Social History  . Marital Status: Married    Spouse Name: N/A    Number of Children: N/A  . Years of Education: N/A   Occupational History  . Not on file.   Social History Main Topics  . Smoking status: Current Every Day Smoker -- 1.0 packs/day for 40 years  . Smokeless tobacco: Never Used  . Alcohol Use: No  . Drug Use: No  . Sexually  Active: Not on file   Other Topics Concern  . Not on file   Social History Narrative  . No narrative on file    Current Outpatient Prescriptions on File Prior to Visit  Medication Sig Dispense Refill  . albuterol (PROVENTIL HFA;VENTOLIN HFA) 108 (90 BASE) MCG/ACT inhaler Inhale 2 puffs into the lungs every 6 (six) hours as needed for wheezing.  1 Inhaler  0  . azithromycin (ZITHROMAX) 250 MG tablet Take 500 mg by mouth daily. 2 tabs today and then 1 tab for 4 days      . calcium carbonate (TUMS - DOSED IN MG ELEMENTAL CALCIUM) 500 MG chewable tablet Chew 1 tablet by mouth daily.      Marland Kitchen desloratadine (CLARINEX) 5 MG tablet Take 5 mg by mouth as needed.       Marland Kitchen HYDROcodone-acetaminophen (NORCO) 10-325 MG per tablet TAKE ONE TABLET BY MOUTH EVERY 6 HOURS AS NEEDED FOR PAIN  100 tablet  0  . insulin NPH-insulin regular (NOVOLIN 70/30) (70-30) 100 UNIT/ML injection Inject into the skin 2 (two) times daily with a meal. Inject 18 units with breakfast, and 8 units before evening meal      . lisinopril-hydrochlorothiazide (PRINZIDE,ZESTORETIC) 20-12.5 MG  per tablet Take 1 tablet by mouth every morning.      . metFORMIN (GLUCOPHAGE) 1000 MG tablet TAKE ONE TABLET BY MOUTH TWICE DAILY  60 tablet  5  . phenazopyridine (PYRIDIUM) 200 MG tablet Take 1 tablet (200 mg total) by mouth 3 (three) times daily as needed for pain.  30 tablet  0  . pravastatin (PRAVACHOL) 40 MG tablet Take 40 mg by mouth 2 (two) times daily.        No Known Allergies  No family history on file.  BP 130/80  Pulse 81  Wt 189 lb (85.73 kg)  SpO2 97%  Review of Systems denies hypoglycemia    Objective:   Physical Exam VITAL SIGNS:  See vs page GENERAL: no distress Pulses: dorsalis pedis intact bilat.   Feet: no deformity.  no ulcer on the feet.  feet are of normal color and temp.  no edema.  Both great toenails are ingrown.   Neuro: sensation is intact to touch on the feet.       Assessment & Plan:  DM, apparently  well-controlled

## 2012-09-12 ENCOUNTER — Other Ambulatory Visit: Payer: Self-pay | Admitting: Endocrinology

## 2012-09-27 ENCOUNTER — Other Ambulatory Visit: Payer: Self-pay | Admitting: Endocrinology

## 2012-09-27 NOTE — Telephone Encounter (Signed)
PATIENT REQUEST REFILL ON MEDICATION. HYDROCODONE-ACET, 10/325. LAST OV 09/06/2012.  FUTURE APPT  WITH YOU 12/06/2012. PLEASE ADVISE.

## 2012-09-27 NOTE — Telephone Encounter (Signed)
Refill on medication called in to [facility] pharmacy. Patient notified of thisl

## 2012-09-28 MED ORDER — HYDROCODONE-ACETAMINOPHEN 10-325 MG PO TABS: ORAL_TABLET | ORAL | Status: DC

## 2012-09-28 NOTE — Telephone Encounter (Signed)
i printed 

## 2012-09-28 NOTE — Addendum Note (Signed)
Addended by: Romero Belling on: 09/28/2012 07:29 AM   Modules accepted: Orders

## 2012-10-29 ENCOUNTER — Other Ambulatory Visit: Payer: Self-pay | Admitting: Endocrinology

## 2012-10-29 ENCOUNTER — Other Ambulatory Visit: Payer: Self-pay | Admitting: *Deleted

## 2012-10-29 MED ORDER — LISINOPRIL-HYDROCHLOROTHIAZIDE 20-12.5 MG PO TABS: 1.0000 | ORAL_TABLET | Freq: Every morning | ORAL | Status: DC

## 2012-11-02 ENCOUNTER — Telehealth: Payer: Self-pay | Admitting: Endocrinology

## 2012-11-02 NOTE — Telephone Encounter (Signed)
Patient would like samples of his insulin. Patient states that they had a power out and his insulin is not not good any longer and they cannot afford to get a new rx. Ok to give samples?

## 2012-11-02 NOTE — Telephone Encounter (Signed)
Sorry, we don't have what you take

## 2012-11-02 NOTE — Telephone Encounter (Signed)
Pt advised and states an understanding 

## 2012-11-12 ENCOUNTER — Other Ambulatory Visit: Payer: Self-pay | Admitting: Endocrinology

## 2012-11-12 ENCOUNTER — Other Ambulatory Visit: Payer: Self-pay | Admitting: *Deleted

## 2012-11-12 MED ORDER — PRAVASTATIN SODIUM 40 MG PO TABS: 40.0000 mg | ORAL_TABLET | Freq: Two times a day (BID) | ORAL | Status: DC

## 2012-11-23 ENCOUNTER — Other Ambulatory Visit: Payer: Self-pay | Admitting: Endocrinology

## 2012-12-06 ENCOUNTER — Ambulatory Visit (INDEPENDENT_AMBULATORY_CARE_PROVIDER_SITE_OTHER): Payer: Medicare Other | Admitting: Endocrinology

## 2012-12-06 VITALS — BP 130/70 | HR 76 | Wt 190.0 lb

## 2012-12-06 DIAGNOSIS — E109 Type 1 diabetes mellitus without complications: Secondary | ICD-10-CM

## 2012-12-06 LAB — HEMOGLOBIN A1C: Hgb A1c MFr Bld: 7.7 % — ABNORMAL HIGH (ref 4.6–6.5)

## 2012-12-06 NOTE — Progress Notes (Signed)
Subjective:    Patient ID: [name], male    DOB: 16-Aug-1955, 57 y.o.   MRN: [id number]  HPI Pt returns for f/u of insulin-requiring DM (dx'ed 1992; no known complications; therapy limited by pt's need for a simple, inexpensive regimen; he has never had severe hypoglycemia or DKA).  no cbg record, but states cbg's are well-controlled.  There is no trend throughout the day.   Past Medical History  Diagnosis Date  . Hypertension   . Diabetic neuropathy   . Herpes labialis   . Wart, venereal     Rectal  . SHOULDER PAIN, LEFT, CHRONIC 01/17/2008    Qualifier: Diagnosis of  By: Everardo All MD, Cleophas Dunker   . HYPERTENSION 03/06/2007    Qualifier: Diagnosis of  By: Tyrone Apple, Lucy    . HYPERCHOLESTEROLEMIA 10/06/2007    Qualifier: Diagnosis of  By: Everardo All MD, Cleophas Dunker   . ALLERGIC RHINITIS 03/06/2007    Qualifier: Diagnosis of  By: Charlsie Quest RMA, Lucy    . Type II diabetes mellitus 03/06/2007    Qualifier: Diagnosis of  By: Charlsie Quest RMA, Lucy    . Smokers' cough   . Productive cough   . Bladder tumor   . GERD (gastroesophageal reflux disease)     Past Surgical History  Procedure Laterality Date  . Laser ablation of penile and perianal warts  07-29-2007  DR GROSS  . Pilonidal cyst excision  X2  . Sweat glands removed axillary and perianal  1999  . Left shoulder surgery  2003  . Cataract extraction w/ intraocular lens implant      RIGHT EYE  . Transurethral resection of bladder tumor  05/21/2012    Procedure: TRANSURETHRAL RESECTION OF BLADDER TUMOR (TURBT);  Surgeon: Garnett Farm, MD;  Location: [facility];  Service: Urology;  Laterality: N/A;   instillation mytomycin c @ 1320      History   Social History  . Marital Status: Married    Spouse Name: N/A    Number of Children: N/A  . Years of Education: N/A   Occupational History  . Not on file.   Social History Main Topics  . Smoking status: Current Every Day Smoker -- 1.00 packs/day for 40 years  . Smokeless tobacco:  Never Used  . Alcohol Use: No  . Drug Use: No  . Sexually Active: Not on file   Other Topics Concern  . Not on file   Social History Narrative  . No narrative on file    Current Outpatient Prescriptions on File Prior to Visit  Medication Sig Dispense Refill  . albuterol (PROVENTIL HFA;VENTOLIN HFA) 108 (90 BASE) MCG/ACT inhaler Inhale 2 puffs into the lungs every 6 (six) hours as needed for wheezing.  1 Inhaler  0  . azithromycin (ZITHROMAX) 250 MG tablet Take 500 mg by mouth daily. 2 tabs today and then 1 tab for 4 days      . calcium carbonate (TUMS - DOSED IN MG ELEMENTAL CALCIUM) 500 MG chewable tablet Chew 1 tablet by mouth daily.      Marland Kitchen desloratadine (CLARINEX) 5 MG tablet Take 5 mg by mouth as needed.       Marland Kitchen HYDROcodone-acetaminophen (NORCO) 10-325 MG per tablet TAKE ONE TABLET BY MOUTH EVERY 6 HOURS AS NEEDED FOR PAIN  100 tablet  0  . insulin NPH-insulin regular (NOVOLIN 70/30) (70-30) 100 UNIT/ML injection Inject into the skin 2 (two) times daily with a meal. Inject 18 units with breakfast, and  8 units before evening meal      . lisinopril-hydrochlorothiazide (PRINZIDE,ZESTORETIC) 20-12.5 MG per tablet TAKE ONE TABLET BY MOUTH EVERY DAY  30 tablet  0  . lisinopril-hydrochlorothiazide (PRINZIDE,ZESTORETIC) 20-12.5 MG per tablet Take 1 tablet by mouth every morning.  30 tablet  4  . metFORMIN (GLUCOPHAGE) 1000 MG tablet TAKE ONE TABLET BY MOUTH TWICE DAILY  60 tablet  4  . phenazopyridine (PYRIDIUM) 200 MG tablet Take 1 tablet (200 mg total) by mouth 3 (three) times daily as needed for pain.  30 tablet  0  . pravastatin (PRAVACHOL) 40 MG tablet TAKE TWO TABLETS BY MOUTH EVERY DAY  60 tablet  0  . pravastatin (PRAVACHOL) 40 MG tablet Take 1 tablet (40 mg total) by mouth 2 (two) times daily.  60 tablet  1   No current facility-administered medications on file prior to visit.    No Known Allergies  No family history on file.  BP 130/70  Pulse 76  Wt 190 lb (86.183 kg)  BMI  26.51 kg/m2  SpO2 95%   Review of Systems denies hypoglycemia    Objective:   Physical Exam VITAL SIGNS:  See vs page GENERAL: no distress SKIN:  Insulin injection sites at the anterior abdomen are normal.     Lab Results  Component Value Date   HGBA1C 7.7* 12/06/2012      Assessment & Plan:  DM: this is the best control this pt should aim for, given this regimen, which does match insulin to her changing needs throughout the day

## 2012-12-06 NOTE — Patient Instructions (Addendum)
check your blood sugar twice a day.  vary the time of day when you check, between before the 3 meals, and at bedtime.  also check if you have symptoms of your blood sugar being too high or too low.  please keep a record of the readings and bring it to your next appointment here.  please call us sooner if your blood sugar goes below 70, or if you have a lot of readings over 200. A diabetes blood test is being requested for you today.  We'll contact you with results. Please come back for a regular physical appointment in 3 months.

## 2012-12-17 ENCOUNTER — Other Ambulatory Visit: Payer: Self-pay | Admitting: Internal Medicine

## 2012-12-17 MED ORDER — HYDROCODONE-ACETAMINOPHEN 10-325 MG PO TABS: 1.0000 | ORAL_TABLET | Freq: Four times a day (QID) | ORAL | Status: DC | PRN

## 2012-12-17 NOTE — Addendum Note (Signed)
Addended by: Romero Belling on: 12/17/2012 11:57 AM   Modules accepted: Orders

## 2012-12-17 NOTE — Telephone Encounter (Signed)
This is Dr. George Hugh pt.

## 2012-12-17 NOTE — Telephone Encounter (Signed)
i printed 

## 2013-01-13 ENCOUNTER — Other Ambulatory Visit: Payer: Self-pay | Admitting: Endocrinology

## 2013-01-14 ENCOUNTER — Telehealth: Payer: Self-pay | Admitting: Endocrinology

## 2013-01-14 NOTE — Telephone Encounter (Signed)
Pharmacy still awaiting script that was supposed to be sent for this patient, it was to replace a script that we denied. please call / Sherri

## 2013-01-14 NOTE — Telephone Encounter (Signed)
rx faxed

## 2013-01-16 ENCOUNTER — Other Ambulatory Visit: Payer: Self-pay | Admitting: Endocrinology

## 2013-02-09 ENCOUNTER — Other Ambulatory Visit: Payer: Self-pay | Admitting: Endocrinology

## 2013-02-09 NOTE — Telephone Encounter (Signed)
Ok to refill 

## 2013-02-14 ENCOUNTER — Other Ambulatory Visit: Payer: Self-pay | Admitting: Endocrinology

## 2013-02-15 ENCOUNTER — Other Ambulatory Visit: Payer: Self-pay | Admitting: *Deleted

## 2013-02-15 MED ORDER — METFORMIN HCL 1000 MG PO TABS: 1000.0000 mg | ORAL_TABLET | Freq: Two times a day (BID) | ORAL | Status: DC

## 2013-02-16 ENCOUNTER — Other Ambulatory Visit: Payer: Self-pay | Admitting: *Deleted

## 2013-02-16 MED ORDER — INSULIN NPH ISOPHANE & REGULAR (70-30) 100 UNIT/ML ~~LOC~~ SUSP: SUBCUTANEOUS | Status: DC

## 2013-02-28 ENCOUNTER — Encounter: Payer: Self-pay | Admitting: Endocrinology

## 2013-02-28 ENCOUNTER — Ambulatory Visit (INDEPENDENT_AMBULATORY_CARE_PROVIDER_SITE_OTHER): Payer: Medicare Other | Admitting: Endocrinology

## 2013-02-28 VITALS — BP 124/74 | HR 80 | Ht 71.0 in | Wt 191.0 lb

## 2013-02-28 DIAGNOSIS — E109 Type 1 diabetes mellitus without complications: Secondary | ICD-10-CM

## 2013-02-28 LAB — HEMOGLOBIN A1C: Hgb A1c MFr Bld: 8.3 % — ABNORMAL HIGH (ref 4.6–6.5)

## 2013-02-28 MED ORDER — LOSARTAN POTASSIUM-HCTZ 50-12.5 MG PO TABS: 1.0000 | ORAL_TABLET | Freq: Every day | ORAL | Status: DC

## 2013-02-28 MED ORDER — ALBUTEROL SULFATE HFA 108 (90 BASE) MCG/ACT IN AERS: 2.0000 | INHALATION_SPRAY | Freq: Four times a day (QID) | RESPIRATORY_TRACT | Status: DC | PRN

## 2013-02-28 NOTE — Progress Notes (Signed)
Subjective:    Patient ID: [name], male    DOB: 10-15-1955, 57 y.o.   MRN: [id number]  HPI Pt returns for f/u of insulin-requiring DM (dx'ed 1992; no known complications; therapy limited by pt's need for a simple, inexpensive regimen; he has never had severe hypoglycemia or DKA).  no cbg record, but states cbg's are well-controlled.  There is no trend throughout the day.  Pt states slight dry-quality cough in the chest, but no assoc sob. Past Medical History  Diagnosis Date  . Hypertension   . Diabetic neuropathy   . Herpes labialis   . Wart, venereal     Rectal  . SHOULDER PAIN, LEFT, CHRONIC 01/17/2008    Qualifier: Diagnosis of  By: Everardo All MD, Cleophas Dunker   . HYPERTENSION 03/06/2007    Qualifier: Diagnosis of  By: Tyrone Apple, Lucy    . HYPERCHOLESTEROLEMIA 10/06/2007    Qualifier: Diagnosis of  By: Everardo All MD, Cleophas Dunker   . ALLERGIC RHINITIS 03/06/2007    Qualifier: Diagnosis of  By: Charlsie Quest RMA, Lucy    . Type II diabetes mellitus 03/06/2007    Qualifier: Diagnosis of  By: Charlsie Quest RMA, Lucy    . Smokers' cough   . Productive cough   . Bladder tumor   . GERD (gastroesophageal reflux disease)     Past Surgical History  Procedure Laterality Date  . Laser ablation of penile and perianal warts  07-29-2007  DR GROSS  . Pilonidal cyst excision  X2  . Sweat glands removed axillary and perianal  1999  . Left shoulder surgery  2003  . Cataract extraction w/ intraocular lens implant      RIGHT EYE  . Transurethral resection of bladder tumor  05/21/2012    Procedure: TRANSURETHRAL RESECTION OF BLADDER TUMOR (TURBT);  Surgeon: Garnett Farm, MD;  Location: [facility];  Service: Urology;  Laterality: N/A;   instillation mytomycin c @ 1320      History   Social History  . Marital Status: Married    Spouse Name: N/A    Number of Children: N/A  . Years of Education: N/A   Occupational History  . Not on file.   Social History Main Topics  . Smoking status: Current  Every Day Smoker -- 1.00 packs/day for 40 years  . Smokeless tobacco: Never Used  . Alcohol Use: No  . Drug Use: No  . Sexually Active: Not on file   Other Topics Concern  . Not on file   Social History Narrative  . No narrative on file    Current Outpatient Prescriptions on File Prior to Visit  Medication Sig Dispense Refill  . calcium carbonate (TUMS - DOSED IN MG ELEMENTAL CALCIUM) 500 MG chewable tablet Chew 1 tablet by mouth daily.      Marland Kitchen desloratadine (CLARINEX) 5 MG tablet Take 5 mg by mouth as needed.       Marland Kitchen HYDROcodone-acetaminophen (NORCO) 10-325 MG per tablet TAKE ONE TABLET BY MOUTH EVERY 6 HOURS AS NEEDED FOR PAIN  100 tablet  0  . phenazopyridine (PYRIDIUM) 200 MG tablet Take 1 tablet (200 mg total) by mouth 3 (three) times daily as needed for pain.  30 tablet  0  . pravastatin (PRAVACHOL) 40 MG tablet TAKE TWO TABLETS BY MOUTH EVERY DAY  60 tablet  0   No current facility-administered medications on file prior to visit.    No Known Allergies  No family history on file.  BP 124/74  Pulse 80  Ht 5\' 11"  (1.803 m)  Wt 191 lb (86.637 kg)  BMI 26.65 kg/m2  SpO2 98%  Review of Systems denies hypoglycemia and weight change    Objective:   Physical Exam VITAL SIGNS:  See vs page GENERAL: no distress  Lab Results  Component Value Date   HGBA1C 8.3* 02/28/2013      Assessment & Plan:  Acute bronchitis, new DM: he needs increased rx.  This insulin regimen was chosen from multiple options, for its simplicity and relatively low cost.  The benefits of glycemic control must be weighed against the risks of hypoglycemia.

## 2013-02-28 NOTE — Patient Instructions (Addendum)
check your blood sugar twice a day.  vary the time of day when you check, between before the 3 meals, and at bedtime.  also check if you have symptoms of your blood sugar being too high or too low.  please keep a record of the readings and bring it to your next appointment here.  please call us sooner if your blood sugar goes below 70, or if you have a lot of readings over 200. A diabetes blood test is being requested for you today.  We'll contact you with results. Please come back for a regular physical appointment in 3 months.   Change lisinopril-hct to losartan-hct.  i have sent a prescription to your pharmacy.

## 2013-03-09 ENCOUNTER — Other Ambulatory Visit: Payer: Self-pay | Admitting: Internal Medicine

## 2013-03-10 ENCOUNTER — Other Ambulatory Visit: Payer: Self-pay | Admitting: *Deleted

## 2013-03-10 MED ORDER — HYDROCODONE-ACETAMINOPHEN 10-325 MG PO TABS: 1.0000 | ORAL_TABLET | Freq: Four times a day (QID) | ORAL | Status: DC | PRN

## 2013-03-29 ENCOUNTER — Other Ambulatory Visit: Payer: Self-pay | Admitting: *Deleted

## 2013-03-29 ENCOUNTER — Other Ambulatory Visit: Payer: Self-pay | Admitting: Endocrinology

## 2013-03-29 MED ORDER — PRAVASTATIN SODIUM 40 MG PO TABS: ORAL_TABLET | ORAL | Status: DC

## 2013-04-06 ENCOUNTER — Other Ambulatory Visit: Payer: Self-pay | Admitting: Internal Medicine

## 2013-04-06 ENCOUNTER — Other Ambulatory Visit: Payer: Self-pay

## 2013-05-03 ENCOUNTER — Other Ambulatory Visit: Payer: Self-pay | Admitting: Endocrinology

## 2013-05-03 ENCOUNTER — Other Ambulatory Visit: Payer: Self-pay

## 2013-06-01 ENCOUNTER — Telehealth: Payer: Self-pay | Admitting: Endocrinology

## 2013-06-01 MED ORDER — HYDROCODONE-ACETAMINOPHEN 10-325 MG PO TABS: 1.0000 | ORAL_TABLET | ORAL | Status: DC | PRN

## 2013-06-01 NOTE — Telephone Encounter (Signed)
i printed cpx is due 

## 2013-06-01 NOTE — Telephone Encounter (Signed)
Pt advised rx ready for pu 

## 2013-06-06 ENCOUNTER — Ambulatory Visit
Admission: RE | Admit: 2013-06-06 | Discharge: 2013-06-06 | Disposition: A | Discharge status: Home / Self Care | Payer: Medicare Other | Source: Ambulatory Visit | Attending: Endocrinology | Admitting: Endocrinology

## 2013-06-06 ENCOUNTER — Ambulatory Visit (INDEPENDENT_AMBULATORY_CARE_PROVIDER_SITE_OTHER): Payer: Medicare Other | Admitting: Endocrinology

## 2013-06-06 ENCOUNTER — Encounter: Payer: Self-pay | Admitting: Endocrinology

## 2013-06-06 VITALS — BP 144/76 | Temp 97.9°F | Ht 68.25 in | Wt 188.7 lb

## 2013-06-06 DIAGNOSIS — E109 Type 1 diabetes mellitus without complications: Secondary | ICD-10-CM

## 2013-06-06 DIAGNOSIS — R059 Cough, unspecified: Secondary | ICD-10-CM

## 2013-06-06 DIAGNOSIS — Z125 Encounter for screening for malignant neoplasm of prostate: Secondary | ICD-10-CM

## 2013-06-06 DIAGNOSIS — R160 Hepatomegaly, not elsewhere classified: Secondary | ICD-10-CM

## 2013-06-06 DIAGNOSIS — R310 Gross hematuria: Secondary | ICD-10-CM

## 2013-06-06 DIAGNOSIS — Z79899 Other long term (current) drug therapy: Secondary | ICD-10-CM

## 2013-06-06 DIAGNOSIS — Z0000 Encounter for general adult medical examination without abnormal findings: Secondary | ICD-10-CM

## 2013-06-06 DIAGNOSIS — Z23 Encounter for immunization: Secondary | ICD-10-CM

## 2013-06-06 DIAGNOSIS — E7800 Pure hypercholesterolemia, unspecified: Secondary | ICD-10-CM

## 2013-06-06 DIAGNOSIS — R05 Cough: Secondary | ICD-10-CM

## 2013-06-06 DIAGNOSIS — I10 Essential (primary) hypertension: Secondary | ICD-10-CM

## 2013-06-06 LAB — CBC WITH DIFFERENTIAL/PLATELET
Basophils Absolute: 0.1 10*3/uL (ref 0.0–0.1)
Basophils Relative: 0.5 % (ref 0.0–3.0)
Eosinophils Absolute: 0.5 10*3/uL (ref 0.0–0.7)
HCT: 45.5 % (ref 39.0–52.0)
Hemoglobin: 15.7 g/dL (ref 13.0–17.0)
Lymphs Abs: 3.4 10*3/uL (ref 0.7–4.0)
MCHC: 34.5 g/dL (ref 30.0–36.0)
MCV: 97.6 fl (ref 78.0–100.0)
Monocytes Absolute: 1 10*3/uL (ref 0.1–1.0)
Neutro Abs: 9.1 10*3/uL — ABNORMAL HIGH (ref 1.4–7.7)
RBC: 4.66 Mil/uL (ref 4.22–5.81)
RDW: 12.8 % (ref 11.5–14.6)

## 2013-06-06 LAB — URINALYSIS, ROUTINE W REFLEX MICROSCOPIC
Bilirubin Urine: NEGATIVE
Leukocytes, UA: NEGATIVE
Nitrite: NEGATIVE
Total Protein, Urine: NEGATIVE
Urine Glucose: 250
Urobilinogen, UA: 0.2 (ref 0.0–1.0)
pH: 5.5 (ref 5.0–8.0)

## 2013-06-06 LAB — HEPATIC FUNCTION PANEL
ALT: 24 U/L (ref 0–53)
AST: 19 U/L (ref 0–37)
Alkaline Phosphatase: 70 U/L (ref 39–117)
Bilirubin, Direct: 0.1 mg/dL (ref 0.0–0.3)

## 2013-06-06 LAB — LIPID PANEL
HDL: 37.4 mg/dL — ABNORMAL LOW (ref >39.00)
LDL Cholesterol: 68 mg/dL (ref 0–99)
Total CHOL/HDL Ratio: 3

## 2013-06-06 LAB — BASIC METABOLIC PANEL
CO2: 28 mEq/L (ref 19–32)
Calcium: 9.4 mg/dL (ref 8.4–10.5)
Chloride: 103 mEq/L (ref 96–112)
Potassium: 3.7 mEq/L (ref 3.5–5.1)
Sodium: 140 mEq/L (ref 135–145)

## 2013-06-06 LAB — MICROALBUMIN / CREATININE URINE RATIO
Creatinine,U: 157.5 mg/dL
Microalb Creat Ratio: 3.7 mg/g (ref 0.0–30.0)
Microalb, Ur: 5.8 mg/dL — ABNORMAL HIGH (ref 0.0–1.9)

## 2013-06-06 LAB — TSH: TSH: 1.14 u[IU]/mL (ref 0.35–5.50)

## 2013-06-06 LAB — PSA: PSA: 2.31 ng/mL (ref 0.10–4.00)

## 2013-06-06 MED ORDER — PROMETHAZINE-DM 6.25-15 MG/5ML PO SYRP: 5.0000 mL | ORAL_SOLUTION | Freq: Four times a day (QID) | ORAL | Status: DC | PRN

## 2013-06-06 NOTE — Patient Instructions (Addendum)
please consider these measures for your health:  minimize alcohol.  do not use tobacco products.  have a colonoscopy at least every 10 years from age 57.  keep firearms safely stored.  always use seat belts.  have working smoke alarms in your home.  see an eye doctor and dentist regularly.  never drive under the influence of alcohol or drugs (including prescription drugs).  those with fair skin should take precautions against the sun. blood tests are being requested for you today.  We'll contact you with results.   Please buy and use nicotine patches.    Please come back for a follow-up appointment in 3 months.    i have sent a prescription to your pharmacy, for the cough.  You can take the hydrocodone pills along with this.

## 2013-06-06 NOTE — Progress Notes (Signed)
Subjective:    Patient ID: [name], male    DOB: July 25, 1956, 56 y.o.   MRN: [id number]  HPI Pt is here for regular wellness examination, and is feeling pretty well in general, and says chronic med probs are stable, except as noted below Past Medical History  Diagnosis Date  . Hypertension   . Diabetic neuropathy   . Herpes labialis   . Wart, venereal     Rectal  . SHOULDER PAIN, LEFT, CHRONIC 01/17/2008    Qualifier: Diagnosis of  By: Everardo All MD, Cleophas Dunker   . HYPERTENSION 03/06/2007    Qualifier: Diagnosis of  By: Tyrone Apple, Lucy    . HYPERCHOLESTEROLEMIA 10/06/2007    Qualifier: Diagnosis of  By: Everardo All MD, Cleophas Dunker   . ALLERGIC RHINITIS 03/06/2007    Qualifier: Diagnosis of  By: Charlsie Quest RMA, Lucy    . Type II diabetes mellitus 03/06/2007    Qualifier: Diagnosis of  By: Charlsie Quest RMA, Lucy    . Smokers' cough   . Productive cough   . Bladder tumor   . GERD (gastroesophageal reflux disease)     Past Surgical History  Procedure Laterality Date  . Laser ablation of penile and perianal warts  07-29-2007  DR GROSS  . Pilonidal cyst excision  X2  . Sweat glands removed axillary and perianal  1999  . Left shoulder surgery  2003  . Cataract extraction w/ intraocular lens implant      RIGHT EYE  . Transurethral resection of bladder tumor  05/21/2012    Procedure: TRANSURETHRAL RESECTION OF BLADDER TUMOR (TURBT);  Surgeon: Garnett Farm, MD;  Location: [facility];  Service: Urology;  Laterality: N/A;   instillation mytomycin c @ 1320      History   Social History  . Marital Status: Married    Spouse Name: N/A    Number of Children: N/A  . Years of Education: N/A   Occupational History  . Not on file.   Social History Main Topics  . Smoking status: Current Every Day Smoker -- 1.00 packs/day for 40 years  . Smokeless tobacco: Never Used  . Alcohol Use: No  . Drug Use: No  . Sexual Activity: Not on file   Other Topics Concern  . Not on file   Social  History Narrative  . No narrative on file    Current Outpatient Prescriptions on File Prior to Visit  Medication Sig Dispense Refill  . albuterol (PROVENTIL HFA;VENTOLIN HFA) 108 (90 BASE) MCG/ACT inhaler Inhale 2 puffs into the lungs every 6 (six) hours as needed for wheezing.  1 Inhaler  2  . calcium carbonate (TUMS - DOSED IN MG ELEMENTAL CALCIUM) 500 MG chewable tablet Chew 1 tablet by mouth daily.      Marland Kitchen desloratadine (CLARINEX) 5 MG tablet Take 5 mg by mouth as needed.       Marland Kitchen HYDROcodone-acetaminophen (NORCO) 10-325 MG per tablet Take 1 tablet by mouth every 4 (four) hours as needed for pain.  100 tablet  0  . insulin NPH-regular (NOVOLIN 70/30) (70-30) 100 UNIT/ML injection Inject 19 units with breakfast, and 11 units before evening meal      . lisinopril-hydrochlorothiazide (PRINZIDE,ZESTORETIC) 20-12.5 MG per tablet       . losartan-hydrochlorothiazide (HYZAAR) 50-12.5 MG per tablet Take 1 tablet by mouth daily.  30 tablet  11  . metFORMIN (GLUCOPHAGE) 1000 MG tablet       . phenazopyridine (PYRIDIUM) 200 MG tablet Take 1  tablet (200 mg total) by mouth 3 (three) times daily as needed for pain.  30 tablet  0  . pravastatin (PRAVACHOL) 40 MG tablet TAKE ONE TABLET BY MOUTH TWICE DAILY  60 tablet  0  . pravastatin (PRAVACHOL) 40 MG tablet TAKE TWO TABLETS BY MOUTH EVERY DAY.  60 tablet  4   No current facility-administered medications on file prior to visit.   No Known Allergies  No family history on file.  BP 144/76  Temp(Src) 97.9 F (36.6 C) (Oral)  Ht 5' 8.25" (1.734 m)  Wt 188 lb 11.2 oz (85.594 kg)  BMI 28.47 kg/m2  Review of Systems  Constitutional: Negative for unexpected weight change.  HENT: Negative for hearing loss.   Eyes: Negative for visual disturbance.  Respiratory: Negative for shortness of breath.   Cardiovascular: Negative for chest pain.  Gastrointestinal: Negative for anal bleeding.  Endocrine: Negative for cold intolerance.  Genitourinary: Negative  for hematuria.  Musculoskeletal: Negative for back pain.  Skin: Negative for rash.  Allergic/Immunologic: Negative for environmental allergies.  Neurological: Negative for numbness.  Hematological: Does not bruise/bleed easily.  Psychiatric/Behavioral: Negative for dysphoric mood.       Objective:   Physical Exam VS: see vs page GEN: no distress HEAD: head: no deformity eyes: no periorbital swelling, no proptosis external nose and ears are normal mouth: no lesion seen NECK: supple, thyroid is not enlarged CHEST WALL: no deformity. BREASTS:  No gynecomastia CV: reg rate and rhythm, no murmur ABD: abdomen is soft, nontender.  no hepatosplenomegaly.  not distended.  no hernia GENITALIA/RECTAL/PROSTATE:  sees urology MUSCULOSKELETAL: muscle bulk and strength are grossly normal.  no obvious joint swelling.  gait is normal and steady PULSES: no carotid bruit NEURO:  cn 2-12 grossly intact.   readily moves all 4's.  SKIN:  Normal texture and temperature.  No rash or suspicious lesion is visible.   NODES:  None palpable at the neck PSYCH: alert, oriented x3.  Does not appear anxious nor depressed.       Assessment & Plan:  Wellness visit today, with problems stable, except as noted. He declines oral med for smoking cessation.        SEPARATE EVALUATION FOLLOWS--EACH PROBLEM HERE IS NEW, NOT RESPONDING TO TREATMENT, OR POSES SIGNIFICANT RISK TO THE PATIENT'S HEALTH: HISTORY OF THE PRESENT ILLNESS: Pt states 1 week of moderate cough in the chest, and assoc fever.   PAST MEDICAL HISTORY reviewed and up to date today REVIEW OF SYSTEMS: Denies LOC PHYSICAL EXAMINATION: VITAL SIGNS:  See vs page GENERAL: no distress LUNGS:  Clear to auscultation LAB/XRAY RESULTS: CXR: NAD IMPRESSION: Acute bronchitis, recurrent PLAN: See instruction page

## 2013-06-07 NOTE — Progress Notes (Signed)
Quick Note:  Called and spoke with pt and pt is aware. ______ 

## 2013-07-12 ENCOUNTER — Telehealth: Payer: Self-pay

## 2013-07-12 MED ORDER — HYDROCODONE-ACETAMINOPHEN 10-325 MG PO TABS: 1.0000 | ORAL_TABLET | ORAL | Status: DC | PRN

## 2013-07-12 NOTE — Telephone Encounter (Signed)
i printed 

## 2013-07-12 NOTE — Telephone Encounter (Signed)
Patient called requesting refill on Hydrocodone. Patient was last seen on 07/07/2013 and last filled on 06/01/2013.   Thanks!

## 2013-07-13 NOTE — Telephone Encounter (Signed)
Patient called and informed that script is ready for pick up.

## 2013-07-19 ENCOUNTER — Other Ambulatory Visit: Payer: Self-pay | Admitting: Endocrinology

## 2013-08-08 ENCOUNTER — Telehealth: Payer: Self-pay

## 2013-08-08 NOTE — Telephone Encounter (Signed)
Pt called requesting refill on hydrocodone. Pt was last seen on 06/06/2013 and medication was last filled on 07/12/2013.  Please advise during Dr. George Hugh absence. Thanks!

## 2013-08-09 ENCOUNTER — Other Ambulatory Visit: Payer: Self-pay | Admitting: *Deleted

## 2013-08-09 MED ORDER — HYDROCODONE-ACETAMINOPHEN 10-325 MG PO TABS: 1.0000 | ORAL_TABLET | ORAL | Status: DC | PRN

## 2013-08-09 NOTE — Telephone Encounter (Signed)
Just realized I printed and took care of this yesterday. Sorry for the mix up. Thanks!

## 2013-08-09 NOTE — Telephone Encounter (Signed)
Could you print this out for me and call the pt? Thanks!

## 2013-08-09 NOTE — Telephone Encounter (Signed)
OK to refill - start 08/12/2013.

## 2013-08-27 ENCOUNTER — Other Ambulatory Visit: Payer: Self-pay | Admitting: Endocrinology

## 2013-09-05 ENCOUNTER — Telehealth: Payer: Self-pay

## 2013-09-05 MED ORDER — HYDROCODONE-ACETAMINOPHEN 10-325 MG PO TABS: 1.0000 | ORAL_TABLET | ORAL | Status: DC | PRN

## 2013-09-05 NOTE — Telephone Encounter (Signed)
Pt called requesting a refill of Hydrocodone. Pt was last seen on 06/06/2013 and medication was last filled on 08/12/2013.  Thanks!

## 2013-09-05 NOTE — Telephone Encounter (Signed)
i printed Ov is due 

## 2013-09-05 NOTE — Telephone Encounter (Signed)
Pt informed that script is ready for pick up and instructed that OV is needed. Script placed upfront.

## 2013-09-12 ENCOUNTER — Ambulatory Visit: Payer: Medicare Other | Admitting: Endocrinology

## 2013-09-19 ENCOUNTER — Other Ambulatory Visit: Payer: Self-pay | Admitting: *Deleted

## 2013-09-19 MED ORDER — PRAVASTATIN SODIUM 40 MG PO TABS: ORAL_TABLET | ORAL | Status: DC

## 2013-10-03 ENCOUNTER — Telehealth: Payer: Self-pay

## 2013-10-03 ENCOUNTER — Other Ambulatory Visit: Payer: Self-pay

## 2013-10-03 MED ORDER — HYDROCODONE-ACETAMINOPHEN 10-325 MG PO TABS: 1.0000 | ORAL_TABLET | ORAL | Status: DC | PRN

## 2013-10-03 NOTE — Telephone Encounter (Signed)
Pt was last seen on 06/06/2013 and medication was last filled on 09/05/2013. Pt coming in for appoint ment on 12/11/2013.  Thanks!

## 2013-10-03 NOTE — Telephone Encounter (Signed)
Ok, i printed 

## 2013-10-03 NOTE — Telephone Encounter (Signed)
The patient called and is hoping to get a refill on his hydrocodone rx   Pt callback - [phone]

## 2013-10-03 NOTE — Telephone Encounter (Signed)
Pt notified that script is ready for pick up. Script placed upfront.

## 2013-10-16 ENCOUNTER — Other Ambulatory Visit: Payer: Self-pay | Admitting: Endocrinology

## 2013-11-01 ENCOUNTER — Telehealth: Payer: Self-pay

## 2013-11-01 MED ORDER — HYDROCODONE-ACETAMINOPHEN 10-325 MG PO TABS: 1.0000 | ORAL_TABLET | ORAL | Status: DC | PRN

## 2013-11-01 NOTE — Telephone Encounter (Signed)
i printed 

## 2013-11-01 NOTE — Telephone Encounter (Signed)
Pt called requesting a refill on his Hydrocodone.  Pt was last seen on 06/06/2013 and medication was last filled on 10/03/2013. Thanks!

## 2013-11-02 NOTE — Telephone Encounter (Signed)
Left voice mail informing that script is ready for pick up. Script placed upfront.

## 2013-11-23 ENCOUNTER — Other Ambulatory Visit: Payer: Self-pay | Admitting: Endocrinology

## 2013-12-01 ENCOUNTER — Telehealth: Payer: Self-pay | Admitting: Endocrinology

## 2013-12-01 NOTE — Telephone Encounter (Signed)
Patient is requesting a new refill of hydrocodone

## 2013-12-02 MED ORDER — HYDROCODONE-ACETAMINOPHEN 10-325 MG PO TABS: 1.0000 | ORAL_TABLET | ORAL | Status: DC | PRN

## 2013-12-02 NOTE — Telephone Encounter (Signed)
Pt is requesting refill on hydrocodone. Pt was last seen on 06/06/2013 and medication was filled on 11/01/2013.  Thanks!

## 2013-12-02 NOTE — Telephone Encounter (Signed)
i printed Ov is due 

## 2013-12-02 NOTE — Telephone Encounter (Signed)
PT informed that rx is ready for pick up and med placed upfront.

## 2013-12-12 ENCOUNTER — Encounter: Payer: Self-pay | Admitting: Endocrinology

## 2013-12-12 ENCOUNTER — Ambulatory Visit (INDEPENDENT_AMBULATORY_CARE_PROVIDER_SITE_OTHER): Payer: Medicare Other | Admitting: Endocrinology

## 2013-12-12 VITALS — BP 146/80 | HR 83 | Temp 98.3°F | Ht 71.0 in | Wt 198.0 lb

## 2013-12-12 DIAGNOSIS — E109 Type 1 diabetes mellitus without complications: Secondary | ICD-10-CM

## 2013-12-12 LAB — HEMOGLOBIN A1C: Hgb A1c MFr Bld: 7.4 % — ABNORMAL HIGH (ref 4.6–6.5)

## 2013-12-12 NOTE — Patient Instructions (Addendum)
check your blood sugar twice a day.  vary the time of day when you check, between before the 3 meals, and at bedtime.  also check if you have symptoms of your blood sugar being too high or too low.  please keep a record of the readings and bring it to your next appointment here.  You can write it on any piece of paper.  please call us sooner if your blood sugar goes below 70, or if you have a lot of readings over 200. Please come back for a follow-up appointment in 3 months.    blood tests are being requested for you today.  We'll contact you with results.   On this type of insulin schedule, you should eat meals on a regular schedule.  If a meal is missed or significantly delayed, your blood sugar could go low.

## 2013-12-12 NOTE — Progress Notes (Signed)
Subjective:    Patient ID: [name], male    DOB: 12-18-1955, 58 y.o.   MRN: [id number]  HPI Pt returns for f/u of insulin-requiring DM (dx'ed 3329; no known complications; he has been on a simple regimen, after poor results with multiple daily injections; he has never had severe hypoglycemia or DKA; he has requested inexpensive human insulin).  no cbg record, but states cbg's are well-controlled.  There is no trend throughout the day.   Past Medical History  Diagnosis Date  . Hypertension   . Diabetic neuropathy   . Herpes labialis   . Wart, venereal     Rectal  . SHOULDER PAIN, LEFT, CHRONIC 01/17/2008    Qualifier: Diagnosis of  By: Loanne Drilling MD, Jacelyn Pi   . HYPERTENSION 03/06/2007    Qualifier: Diagnosis of  By: Reatha Armour, Lucy    . HYPERCHOLESTEROLEMIA 10/06/2007    Qualifier: Diagnosis of  By: Loanne Drilling MD, Jacelyn Pi   . ALLERGIC RHINITIS 03/06/2007    Qualifier: Diagnosis of  By: Marca Ancona RMA, Lucy    . Type II diabetes mellitus 03/06/2007    Qualifier: Diagnosis of  By: Marca Ancona RMA, Lucy    . Smokers' cough   . Productive cough   . Bladder tumor   . GERD (gastroesophageal reflux disease)     Past Surgical History  Procedure Laterality Date  . Laser ablation of penile and perianal warts  07-29-2007  DR GROSS  . Pilonidal cyst excision  X2  . Sweat glands removed axillary and perianal  1999  . Left shoulder surgery  2003  . Cataract extraction w/ intraocular lens implant      RIGHT EYE  . Transurethral resection of bladder tumor  05/21/2012    Procedure: TRANSURETHRAL RESECTION OF BLADDER TUMOR (TURBT);  Surgeon: Claybon Jabs, MD;  Location: [facility];  Service: Urology;  Laterality: N/A;   instillation mytomycin c @ 1320      History   Social History  . Marital Status: Married    Spouse Name: N/A    Number of Children: N/A  . Years of Education: N/A   Occupational History  . Not on file.   Social History Main Topics  . Smoking status: Current  Every Day Smoker -- 1.00 packs/day for 40 years  . Smokeless tobacco: Never Used  . Alcohol Use: No  . Drug Use: No  . Sexual Activity: Not on file   Other Topics Concern  . Not on file   Social History Narrative  . No narrative on file    Current Outpatient Prescriptions on File Prior to Visit  Medication Sig Dispense Refill  . albuterol (PROVENTIL HFA;VENTOLIN HFA) 108 (90 BASE) MCG/ACT inhaler Inhale 2 puffs into the lungs every 6 (six) hours as needed for wheezing.  1 Inhaler  2  . calcium carbonate (TUMS - DOSED IN MG ELEMENTAL CALCIUM) 500 MG chewable tablet Chew 1 tablet by mouth daily.      Marland Kitchen desloratadine (CLARINEX) 5 MG tablet Take 5 mg by mouth as needed.       Marland Kitchen HYDROcodone-acetaminophen (NORCO) 10-325 MG per tablet Take 1 tablet by mouth every 4 (four) hours as needed.  120 tablet  0  . insulin NPH-regular (NOVOLIN 70/30) (70-30) 100 UNIT/ML injection Inject 19 units with breakfast, and 11 units before evening meal      . lisinopril-hydrochlorothiazide (PRINZIDE,ZESTORETIC) 20-12.5 MG per tablet       . losartan-hydrochlorothiazide (HYZAAR) 50-12.5 MG per  tablet Take 1 tablet by mouth daily.  30 tablet  11  . phenazopyridine (PYRIDIUM) 200 MG tablet Take 1 tablet (200 mg total) by mouth 3 (three) times daily as needed for pain.  30 tablet  0  . pravastatin (PRAVACHOL) 40 MG tablet TAKE TWO TABLETS BY MOUTH EVERY DAY.  60 tablet  4  . promethazine-dextromethorphan (PROMETHAZINE-DM) 6.25-15 MG/5ML syrup Take 5 mLs by mouth 4 (four) times daily as needed for cough.  240 mL  0   No current facility-administered medications on file prior to visit.    No Known Allergies  No family history on file.  BP 146/80  Pulse 83  Temp(Src) 98.3 F (36.8 C) (Oral)  Ht 5\' 11"  (1.803 m)  Wt 198 lb (89.812 kg)  BMI 27.63 kg/m2  SpO2 95%  Review of Systems He denies hypoglycemia and weight change    Objective:   Physical Exam VITAL SIGNS:  See vs page GENERAL: no  distress   Lab Results  Component Value Date   HGBA1C 7.4* 12/12/2013       Assessment & Plan:  DM: this is the best control this pt should aim for, given this regimen, which does match insulin to her changing needs throughout the day  This insulin regimen was chosen from multiple options, for its simplicity and relatively low cost.  The benefits of glycemic control must be weighed against the risks of hypoglycemia. Arthralgias: these limit exercise rx of DM.

## 2013-12-30 ENCOUNTER — Telehealth: Payer: Self-pay

## 2013-12-30 NOTE — Telephone Encounter (Signed)
Pt's wife called stating that pt's blood sugar has been in the high 300s since coming off the metformin. Pt's wife confirms the pt is taking 19 units of Novolin at breakfast and 11 units with supper. Pt wanted to know if he should go back on the metformin because while he was on that his sugars were stable.  Please advise, Thanks!

## 2013-12-30 NOTE — Telephone Encounter (Signed)
Please stay-off the metformin, and: Increase the insulin to 25 units with breakfast, and 15 units with the evening meal. Please call back if this doesn't help.

## 2013-12-30 NOTE — Telephone Encounter (Signed)
Pt's wife advised.

## 2014-01-05 ENCOUNTER — Other Ambulatory Visit: Payer: Self-pay | Admitting: Endocrinology

## 2014-01-05 ENCOUNTER — Telehealth: Payer: Self-pay | Admitting: Endocrinology

## 2014-01-05 MED ORDER — HYDROCODONE-ACETAMINOPHEN 10-325 MG PO TABS: 1.0000 | ORAL_TABLET | ORAL | Status: DC | PRN

## 2014-01-05 NOTE — Telephone Encounter (Signed)
Pt would like his hydrocodone rx- he is in the lobby

## 2014-01-05 NOTE — Telephone Encounter (Signed)
Rx printed and given to wife during ov with Dr. Loanne Drilling today.

## 2014-01-12 ENCOUNTER — Other Ambulatory Visit: Payer: Self-pay | Admitting: Endocrinology

## 2014-01-13 ENCOUNTER — Telehealth: Payer: Self-pay | Admitting: Gastroenterology

## 2014-01-13 NOTE — Telephone Encounter (Signed)
Pts wife states that as soon as the pt eats the food is "running right through him." States that sometimes when they are out he has a hard time making it to the bathroom in time. Also states there is a sore place right on the outside of his rectum, ?rectum. Pt scheduled to see Nicoletta Ba PA Monday 01/16/14@ 9:30am. Pts wife aware of appt.

## 2014-01-16 ENCOUNTER — Telehealth: Payer: Self-pay | Admitting: *Deleted

## 2014-01-16 ENCOUNTER — Ambulatory Visit (INDEPENDENT_AMBULATORY_CARE_PROVIDER_SITE_OTHER): Payer: Medicare Other | Admitting: Physician Assistant

## 2014-01-16 ENCOUNTER — Encounter: Payer: Self-pay | Admitting: Physician Assistant

## 2014-01-16 VITALS — BP 126/66 | HR 76 | Ht 71.0 in | Wt 196.4 lb

## 2014-01-16 DIAGNOSIS — A630 Anogenital (venereal) warts: Secondary | ICD-10-CM

## 2014-01-16 DIAGNOSIS — R198 Other specified symptoms and signs involving the digestive system and abdomen: Secondary | ICD-10-CM

## 2014-01-16 DIAGNOSIS — K6289 Other specified diseases of anus and rectum: Secondary | ICD-10-CM

## 2014-01-16 NOTE — Telephone Encounter (Signed)
I advised the patient that he has an appointment with Dr. Alwyn Pea at [facility] on 01-31-2014 at 10:00 am . Patient verbalized understanding instructions.

## 2014-01-16 NOTE — Patient Instructions (Addendum)
You have been given a separate informational sheet regarding your tobacco use, the importance of quitting and local resources to help you quit. We have done a referral to [facility].  They will call you with an appointment with a rectal surgeon for a consultation.  You will be seeing Dr. Johney Maine  On 6-23. Arrive at 10:00 am .

## 2014-01-16 NOTE — Progress Notes (Signed)
Subjective:    Patient ID: [name], male    DOB: 08/26/1955, 58 y.o.   MRN: [id number]  HPI  [name] is a 58 year old white male known to Dr. Deatra Ina who has history of type 1 diabetes on insulin long-term, he also has history of bladder cancer and states that he had surgery 3 times in the remote past for pilonidal cyst and complications from that surgery. He had colonoscopy in January 2011 had 2 small polyps removed from the sigmoid colon was noted to have diverticulosis. Path of the polyps consistent with tubular adenomas and he is to followup in 5 years. Patient comes in today primarily concerned about some abnormal tissue" knots" on the outside of his rectum. He says this is been present over the past 4-5 years but has gotten larger. He says it's difficult to clean himself after bowel movements. He occasionally sees a small amount of blood on the tissue has not noted any blood in the commode. He has intermittent problems with loose stools with normal bowel movements in between. An average day he may have 2 bowel movements. He had been on metformin long term ,and this was recently discontinued and his insulin decreased. Says he thinks he's had less diarrhea since that time. No complaints of abdominal pain cramping nausea vomiting etc.    Review of Systems  Constitutional: Negative.   HENT: Negative.   Eyes: Negative.   Respiratory: Negative.   Gastrointestinal: Positive for diarrhea.  Endocrine: Negative.   Genitourinary: Negative.   Musculoskeletal: Negative.   Skin: Negative.   Allergic/Immunologic: Negative.   Neurological: Negative.   Hematological: Negative.   Psychiatric/Behavioral: Negative.    Outpatient Prescriptions Prior to Visit  Medication Sig Dispense Refill  . albuterol (PROVENTIL HFA;VENTOLIN HFA) 108 (90 BASE) MCG/ACT inhaler Inhale 2 puffs into the lungs every 6 (six) hours as needed for wheezing.  1 Inhaler  2  . calcium carbonate (TUMS - DOSED IN MG ELEMENTAL  CALCIUM) 500 MG chewable tablet Chew 1 tablet by mouth daily.      Marland Kitchen desloratadine (CLARINEX) 5 MG tablet Take 5 mg by mouth as needed.       Marland Kitchen HYDROcodone-acetaminophen (NORCO) 10-325 MG per tablet Take 1 tablet by mouth every 4 (four) hours as needed.  120 tablet  0  . insulin NPH-regular (NOVOLIN 70/30) (70-30) 100 UNIT/ML injection Inject 25 units with breakfast, and 15 units with the evening meal      . lisinopril-hydrochlorothiazide (PRINZIDE,ZESTORETIC) 20-12.5 MG per tablet       . losartan-hydrochlorothiazide (HYZAAR) 50-12.5 MG per tablet Take 1 tablet by mouth daily.  30 tablet  11  . NOVOLIN 70/30 RELION (70-30) 100 UNIT/ML injection INJECT 18 UNITS SUBCUTANEOUSLY WITH BREAKFAST AND 10 UNITS BEFORE EVENING MEALS  10 mL  2  . phenazopyridine (PYRIDIUM) 200 MG tablet Take 1 tablet (200 mg total) by mouth 3 (three) times daily as needed for pain.  30 tablet  0  . pravastatin (PRAVACHOL) 40 MG tablet TAKE TWO TABLETS BY MOUTH EVERY DAY.  60 tablet  4  . promethazine-dextromethorphan (PROMETHAZINE-DM) 6.25-15 MG/5ML syrup Take 5 mLs by mouth 4 (four) times daily as needed for cough.  240 mL  0   No facility-administered medications prior to visit.   No Known Allergies Patient Active Problem List   Diagnosis Date Noted  . Bladder cancer 05/21/2012  . Gross hematuria 03/25/2012  . Encounter for long-term (current) use of other medications 02/02/2012  . Screening for prostate  cancer 02/02/2012  . Hepatomegaly 09/10/2010  . ABNORMAL ELECTROCARDIOGRAM 09/10/2010  . FOOT PAIN, LEFT 05/02/2010  . ASTHMA 07/25/2009  . SMOKER 05/22/2009  . Cough 05/11/2008  . WARTS, MULTIPLE 01/17/2008  . SHOULDER PAIN, LEFT, CHRONIC 01/17/2008  . HYPERCHOLESTEROLEMIA 10/06/2007  . DIABETES MELLITUS, TYPE I 03/06/2007  . HYPERTENSION 03/06/2007  . ALLERGIC RHINITIS 03/06/2007   History  Substance Use Topics  . Smoking status: Current Every Day Smoker -- 1.00 packs/day for 40 years  . Smokeless  tobacco: Never Used     Comment: form given 01-16-14  . Alcohol Use: No   family history includes Diabetes in his maternal aunt. There is no history of Colon cancer, Esophageal cancer, Pancreatic cancer, Prostate cancer, Kidney disease, Liver disease, or Lung cancer.     Objective:   Physical Exam  well-developed white male in no acute distress, blood pressure 126/66 pulse 76 height 5 foot 11 weight 196. HEENT; nontraumatic normocephalic EOMI PERRLA sclera anicteric, Supple; no JVD, Cardiovascular; regular rate and rhythm with S1-S2 no murmur or gallop, Pulmonary; clear bilaterally, Abdomen soft nontender nondistended bowel sounds are active no palpable mass or hepatosplenomegaly, Rectal exam; very  large anal and perianal condylomata, there are smaller satellite-appearing lesions in the perianal  area , digital exam anus somewhat scarred no internal abnormality palpated stool heme-negative Extremities ;no clubbing cyanosis or edema skin warm and dry, Psych; mood and affect appropriate        Assessment & Plan:  #10  58 year old white male with very large anal and perianal condylomas. #2 intermittent loose stools-improved off metformin still occurring intermittently he may have diabetic visceral neuropathy and/or IBS #3 history of adenomatous colon polyps due for followup 2016 #4 diverticulosis #5 history of bladder cancer  Plan; Patient will be referred to a colorectal surgeon as these lesions are are too large to be treated with any local treatment and will likely need to be resected. Patient does not feel that he needs any specific therapy for the loose stools at this time Plan for followup colonoscopy with Dr. Deatra Ina in early 2016

## 2014-01-31 ENCOUNTER — Ambulatory Visit (INDEPENDENT_AMBULATORY_CARE_PROVIDER_SITE_OTHER): Payer: Medicare Other | Admitting: Surgery

## 2014-01-31 ENCOUNTER — Encounter (INDEPENDENT_AMBULATORY_CARE_PROVIDER_SITE_OTHER): Payer: Self-pay | Admitting: Surgery

## 2014-01-31 VITALS — BP 132/84 | HR 80 | Temp 98.0°F | Resp 18 | Ht 69.0 in | Wt 191.0 lb

## 2014-01-31 DIAGNOSIS — N4889 Other specified disorders of penis: Secondary | ICD-10-CM

## 2014-01-31 DIAGNOSIS — A630 Anogenital (venereal) warts: Secondary | ICD-10-CM

## 2014-01-31 NOTE — Progress Notes (Signed)
Subjective:     Patient ID: [name], male   DOB: 1956/03/22, 58 y.o.   MRN: [id number]  HPI  Note: Portions of this report may have been transcribed using voice recognition software. Every effort was made to ensure accuracy; however, inadvertent computerized transcription errors may be present.   Any transcriptional errors that result from this process are unintentional.            [name]  1956/02/20 [id number]  Patient Care Team: [name], MD as PCP - General Inda Castle, MD as Consulting Physician (Gastroenterology) Claybon Jabs, MD as Consulting Physician (Urology)  This patient is a 58 y.o.male who presents today for surgical evaluation at the request of self.   Reason for visit: Recurrent perianal masses.  Moses-looking male.  History of condyloma.  I removed/ablated large volume in 2008.  Treated small recurrences 4 year.  Had more significant recurrence in 2009.  I recommended surgery.  Lost to followup.  History of significant hydradenitis requiring numerous resections in 1990s.   Apparently he developed some hematuria.  Was found to have a bladder tumor.  Followed by urology.  That is stabilized.  Was found to have a more of masses on the anus.  Sent to gastroenterology  Surgical evaluation requested.  He wondered if they were hemorrhoids.  He denies fevers or chills.  Occasional loose bowel movements but no severe diarrhea.  Patient Active Problem List   Diagnosis Date Noted  . Bladder cancer 05/21/2012  . Gross hematuria 03/25/2012  . Encounter for long-term (current) use of other medications 02/02/2012  . Screening for prostate cancer 02/02/2012  . Hepatomegaly 09/10/2010  . ABNORMAL ELECTROCARDIOGRAM 09/10/2010  . FOOT PAIN, LEFT 05/02/2010  . ASTHMA 07/25/2009  . SMOKER 05/22/2009  . Cough 05/11/2008  . WARTS, MULTIPLE 01/17/2008  . SHOULDER PAIN, LEFT, CHRONIC 01/17/2008  . HYPERCHOLESTEROLEMIA 10/06/2007  . DIABETES MELLITUS, TYPE I  03/06/2007  . HYPERTENSION 03/06/2007  . ALLERGIC RHINITIS 03/06/2007    Past Medical History  Diagnosis Date  . Hypertension   . Diabetic neuropathy   . Herpes labialis   . Wart, venereal     Rectal  . SHOULDER PAIN, LEFT, CHRONIC 01/17/2008    Qualifier: Diagnosis of  By: Loanne Drilling MD, Jacelyn Pi   . HYPERTENSION 03/06/2007    Qualifier: Diagnosis of  By: Reatha Armour, Lucy    . HYPERCHOLESTEROLEMIA 10/06/2007    Qualifier: Diagnosis of  By: Loanne Drilling MD, Jacelyn Pi   . ALLERGIC RHINITIS 03/06/2007    Qualifier: Diagnosis of  By: Marca Ancona RMA, Lucy    . Type II diabetes mellitus 03/06/2007    Qualifier: Diagnosis of  By: Marca Ancona RMA, Lucy    . Smokers' cough   . Productive cough   . Bladder tumor   . GERD (gastroesophageal reflux disease)     Past Surgical History  Procedure Laterality Date  . Laser ablation of penile and perianal warts  07-29-2007  DR GROSS  . Pilonidal cyst excision  X2  . Sweat glands removed axillary and perianal  1999  . Left shoulder surgery  2003  . Cataract extraction w/ intraocular lens implant      RIGHT EYE  . Transurethral resection of bladder tumor  05/21/2012    Procedure: TRANSURETHRAL RESECTION OF BLADDER TUMOR (TURBT);  Surgeon: Claybon Jabs, MD;  Location: [facility];  Service: Urology;  Laterality: N/A;   instillation mytomycin c @ 1320  History   Social History  . Marital Status: Married    Spouse Name: N/A    Number of Children: N/A  . Years of Education: N/A   Occupational History  . Not on file.   Social History Main Topics  . Smoking status: Current Every Day Smoker -- 1.00 packs/day for 40 years  . Smokeless tobacco: Never Used     Comment: form given 01-16-14  . Alcohol Use: No  . Drug Use: No  . Sexual Activity: Not on file   Other Topics Concern  . Not on file   Social History Narrative  . No narrative on file    Family History  Problem Relation Age of Onset  . Colon cancer Neg Hx   . Esophageal cancer  Neg Hx   . Pancreatic cancer Neg Hx   . Prostate cancer Neg Hx   . Kidney disease Neg Hx   . Liver disease Neg Hx   . Lung cancer Neg Hx   . Diabetes Maternal Aunt     x 2  . Hypertension Mother   . Cancer Father     lung ca    Current Outpatient Prescriptions  Medication Sig Dispense Refill  . ACCU-CHEK AVIVA PLUS test strip       . albuterol (PROVENTIL HFA;VENTOLIN HFA) 108 (90 BASE) MCG/ACT inhaler Inhale 2 puffs into the lungs every 6 (six) hours as needed for wheezing.  1 Inhaler  2  . calcium carbonate (TUMS - DOSED IN MG ELEMENTAL CALCIUM) 500 MG chewable tablet Chew 1 tablet by mouth daily.      Marland Kitchen desloratadine (CLARINEX) 5 MG tablet Take 5 mg by mouth as needed.       Marland Kitchen HYDROcodone-acetaminophen (NORCO) 10-325 MG per tablet Take 1 tablet by mouth every 4 (four) hours as needed.  120 tablet  0  . insulin NPH-regular (NOVOLIN 70/30) (70-30) 100 UNIT/ML injection Inject 25 units with breakfast, and 15 units with the evening meal      . lisinopril-hydrochlorothiazide (PRINZIDE,ZESTORETIC) 20-12.5 MG per tablet       . losartan-hydrochlorothiazide (HYZAAR) 50-12.5 MG per tablet Take 1 tablet by mouth daily.  30 tablet  11  . NOVOLIN 70/30 RELION (70-30) 100 UNIT/ML injection INJECT 18 UNITS SUBCUTANEOUSLY WITH BREAKFAST AND 10 UNITS BEFORE EVENING MEALS  10 mL  2  . phenazopyridine (PYRIDIUM) 200 MG tablet Take 1 tablet (200 mg total) by mouth 3 (three) times daily as needed for pain.  30 tablet  0  . pravastatin (PRAVACHOL) 40 MG tablet TAKE TWO TABLETS BY MOUTH EVERY DAY.  60 tablet  4  . promethazine-dextromethorphan (PROMETHAZINE-DM) 6.25-15 MG/5ML syrup Take 5 mLs by mouth 4 (four) times daily as needed for cough.  240 mL  0   No current facility-administered medications for this visit.     No Known Allergies  BP 132/84  Pulse 80  Temp(Src) 98 F (36.7 C)  Resp 18  Ht 5\' 9"  (1.753 m)  Wt 191 lb (86.637 kg)  BMI 28.19 kg/m2  No results found.   Review of Systems    Constitutional: Negative for fever, chills and diaphoresis.  HENT: Negative for ear discharge, facial swelling, mouth sores, nosebleeds, sore throat and trouble swallowing.   Eyes: Negative for photophobia, discharge and visual disturbance.  Respiratory: Negative for choking, chest tightness, shortness of breath and stridor.   Cardiovascular: Negative for chest pain and palpitations.  Gastrointestinal: Negative for nausea, vomiting, abdominal pain, diarrhea, constipation, blood in stool,  abdominal distention, anal bleeding and rectal pain.  Endocrine: Negative for cold intolerance and heat intolerance.  Genitourinary: Negative for dysuria, urgency, difficulty urinating and testicular pain.  Musculoskeletal: Negative for arthralgias, back pain, gait problem and myalgias.  Skin: Negative for color change, pallor, rash and wound.  Allergic/Immunologic: Negative for environmental allergies and food allergies.  Neurological: Negative for dizziness, speech difficulty, weakness, numbness and headaches.  Hematological: Negative for adenopathy. Does not bruise/bleed easily.  Psychiatric/Behavioral: Negative for hallucinations, confusion and agitation.       Objective:   Physical Exam  Constitutional: He is oriented to person, place, and time. He appears well-developed and well-nourished. No distress.  HENT:  Head: Normocephalic.  Mouth/Throat: Oropharynx is clear and moist. No oropharyngeal exudate.  Eyes: Conjunctivae and EOM are normal. Pupils are equal, round, and reactive to light. No scleral icterus.  Neck: Normal range of motion. Neck supple. No tracheal deviation present.  Cardiovascular: Normal rate, regular rhythm and intact distal pulses.   Pulmonary/Chest: Effort normal and breath sounds normal. No respiratory distress.  Abdominal: Soft. He exhibits no distension. There is no tenderness. Hernia confirmed negative in the right inguinal area and confirmed negative in the left inguinal  area.  Genitourinary:    Uncircumcised.     Musculoskeletal: Normal range of motion. He exhibits no tenderness.  Lymphadenopathy:    He has no cervical adenopathy.       Right: No inguinal adenopathy present.       Left: No inguinal adenopathy present.  Neurological: He is alert and oriented to person, place, and time. No cranial nerve deficit. He exhibits normal muscle tone. Coordination normal.  Skin: Skin is warm and dry. No rash noted. He is not diaphoretic. No erythema. No pallor.  Psychiatric: He has a normal mood and affect. His behavior is normal. Judgment and thought content normal.       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  Too large to treat in office.     Plan:     I recommended removal and/or ablation with laser.  I called his urologist, Dr Karsten Ro.  We will coordinate it to do this at the same time.  Again given information about condylomata.  The anatomy & physiology of the anorectal region was discussed.  The pathophysiology of anorectal warts and differential diagnosis was discussed.  Natural history risks without surgery was discussed such as further growth and cancer.   I stressed the importance of office follow-up to catch early recurrence & minimize/halt progression of disease.  Interventions such as cauterization by topical agents were discussed.  The patient's symptoms are not adequately controlled by non-operative treatments.  I feel the risks & problems of no surgery outweigh the operative risks; therefore, I recommended surgery to treat the anal warts by removal, ablation and/or cauterization.  Risks such as bleeding, infection, need for further treatment, heart attack, death, and other risks were discussed.   I noted a good likelihood this will help address the problem. Goals of post-operative recovery were discussed as well.  Possibility that this will not correct all symptoms was explained.  Post-operative pain, bleeding,  constipation, and other problems after surgery were discussed.  We will work to minimize complications.   Educational handouts further explaining the pathology, treatment options, and bowel regimen were given as well.  Questions were answered.  The patient expresses understanding & wishes to proceed with surgery.

## 2014-01-31 NOTE — Patient Instructions (Addendum)
Please consider the recommendations that we have given you today:  Consider surgery to remove for a blade the recurrent warts on her genitals and around the anus.  Coordination case with your urologist, Dr. Karsten Ro  See the Handout(s) we have given you.  Please call our office at [phone] if you wish to schedule surgery or if you have further questions / concerns.   Recommended surgeon followup physical exam schedule for condyloma/warts: -every 6 months until negative exam x2, then -every Year until negative exam x2, then -as needed thereafter   Genital Warts Genital warts are a sexually transmitted infection. They may appear as small bumps on the tissues of the genital area. CAUSES  Genital warts are caused by a virus called human papillomavirus (HPV). HPV is the most common sexually transmitted disease (STD) and infection of the sex organs. This infection is spread by having unprotected sex with an infected person. It can be spread by vaginal, anal, and oral sex. Many people do not know they are infected. They may be infected for years without problems. However, even if they do not have problems, they can unknowingly pass the infection to their sexual partners. SYMPTOMS   Itching and irritation in the genital area.Anal wart   Warts that bleed.  Painful sexual intercourse. DIAGNOSIS  Warts are usually recognized with the naked eye on the vagina, vulva, perineum, anus, and rectum. Certain tests can also diagnose genital warts, such as:  A Pap test.  A tissue sample (biopsy) exam.  Colposcopy. A magnifying tool is used to examine the vagina and cervix. The HPV cells will change color when certain solutions are used. TREATMENT  Warts can be removed by:  Applying certain chemicals, such as cantharidin or podophyllin.  Liquid nitrogen freezing (cryotherapy).  Immunotherapy with candida or trichophyton injections.  Laser treatment.  Burning with an electrified probe  (electrocautery).  Interferon injections.  Surgery. PREVENTION  HPV vaccination can help prevent HPV infections that cause genital warts and that cause cancer of the cervix. It is recommended that the vaccination be given to people between the ages 31 to 62 years old. The vaccine might not work as well or might not work at all if you already have HPV. It should not be given to pregnant women. HOME CARE INSTRUCTIONS   It is important to follow your caregiver's instructions. The warts will not go away without treatment. Repeat treatments are often needed to get rid of warts. Even after it appears that the warts are gone, the normal tissue underneath often remains infected.  Do not try to treat genital warts with medicine used to treat hand warts. This type of medicine is strong and can burn the skin in the genital area, causing more damage.  Tell your past and current sexual partner(s) that you have genital warts. They may be infected also and need treatment.  Avoid sexual contact while being treated.  Do not touch or scratch the warts. The infection may spread to other parts of your body.  Women with genital warts should have a cervical cancer check (Pap test) at least once a year. This type of cancer is slow-growing and can be cured if found early. Chances of developing cervical cancer are increased with HPV.  Inform your obstetrician about your warts in the event of pregnancy. This virus can be passed to the baby's respiratory tract. Discuss this with your caregiver.  Use a condom during sexual intercourse. Following treatment, the use of condoms will help prevent  reinfection.  Ask your caregiver about using over-the-counter anti-itch creams. SEEK MEDICAL CARE IF:   Your treated skin becomes red, swollen, or painful.  You have a fever.  You feel generally ill.  You feel little lumps in and around your genital area.  You are bleeding or have painful sexual intercourse. MAKE SURE  YOU:   Understand these instructions.  Will watch your condition.  Will get help right away if you are not doing well or get worse.   ANORECTAL SURGERY: POST OP INSTRUCTIONS  1. Take your usually prescribed home medications unless otherwise directed. 2. DIET: Follow a light bland diet the first 24 hours after arrival home, such as soup, liquids, crackers, etc.  Be sure to include lots of fluids daily.  Avoid fast food or heavy meals as your are more likely to get nauseated.  Eat a low fat the next few days after surgery.   3. PAIN CONTROL: a. Pain is best controlled by a usual combination of three different methods TOGETHER: i. Ice/Heat ii. Over the counter pain medication iii. Prescription pain medication b. Most patients will experience some swelling and discomfort in the anus/rectal area. and incisions.  Ice packs or heat (30-60 minutes up to 6 times a day) will help. Use ice for the first few days to help decrease swelling and bruising, then switch to heat such as warm towels, sitz baths, warm baths, etc to help relax tight/sore spots and speed recovery.  Some people prefer to use ice alone, heat alone, alternating between ice & heat.  Experiment to what works for you.  Swelling and bruising can take several weeks to resolve.   c. It is helpful to take an over-the-counter pain medication regularly for the first few weeks.  Choose one of the following that works best for you: i. Naproxen (Aleve, etc)  Two 220mg  tabs twice a day ii. Ibuprofen (Advil, etc) Three 200mg  tabs four times a day (every meal & bedtime) iii. Acetaminophen (Tylenol, etc) 500-650mg  four times a day (every meal & bedtime) d. A  prescription for pain medication (such as oxycodone, hydrocodone, etc) should be given to you upon discharge.  Take your pain medication as prescribed.  i. If you are having problems/concerns with the prescription medicine (does not control pain, nausea, vomiting, rash, itching, etc), please  call us [phone] to see if we need to switch you to a different pain medicine that will work better for you and/or control your side effect better. ii. If you need a refill on your pain medication, please contact your pharmacy.  They will contact our office to request authorization. Prescriptions will not be filled after 5 pm or on week-ends.  Use a Sitz Bath 4-8 times a day for relief A sitz bath is a warm water bath taken in the sitting position that covers only the hips and buttocks. It may be used for either healing or hygiene purposes. Sitz baths are also used to relieve pain, itching, or muscle spasms. The water may contain medicine. Moist heat will help you heal and relax.  HOME CARE INSTRUCTIONS  Take 3 to 4 sitz baths a day. 1. Fill the bathtub half full with warm water. 2. Sit in the water and open the drain a little. 3. Turn on the warm water to keep the tub half full. Keep the water running constantly. 4. Soak in the water for 15 to 20 minutes. 5. After the sitz bath, pat the affected area dry first. SEEK  MEDICAL CARE IF:  You get worse instead of better. Stop the sitz baths if you get worse.   4. KEEP YOUR BOWELS REGULAR a. The goal is one bowel movement a day b. Avoid getting constipated.  Between the surgery and the pain medications, it is common to experience some constipation.  Increasing fluid intake and taking a fiber supplement (such as Metamucil, Citrucel, FiberCon, MiraLax, etc) 1-2 times a day regularly will usually help prevent this problem from occurring.  A mild laxative (prune juice, Milk of Magnesia, MiraLax, etc) should be taken according to package directions if there are no bowel movements after 48 hours. c. Watch out for diarrhea.  If you have many loose bowel movements, simplify your diet to bland foods & liquids for a few days.  Stop any stool softeners and decrease your fiber supplement.  Switching to mild anti-diarrheal medications (Kayopectate, Pepto  Bismol) can help.  If this worsens or does not improve, please call us.  5. Wound Care a. Remove your bandages the day after surgery.  Unless discharge instructions indicate otherwise, leave your bandage dry and in place overnight.  Remove the bandage during your first bowel movement.   b. Allow the wound packing to fall out over the next few days.  You can trim exposed gauze / ribbon as it falls out.  You do not need to repack the wound unless instructed otherwise.  Wear an absorbent pad or soft cotton gauze in your underwear as needed to catch any drainage and help keep the area  c. Keep the area clean and dry.  Bathe / shower every day.  Keep the area clean by showering / bathing over the incision / wound.   It is okay to soak an open wound to help wash it.  Wet wipes or showers / gentle washing after bowel movements is often less traumatic than regular toilet paper. d. Dennis Bast may have some styrofoam-like soft packing in the rectum which will come out with the first bowel movement.  e. You will often notice bleeding with bowel movements.  This should slow down by the end of the first week of surgery f. Expect some drainage.  This should slow down, too, by the end of the first week of surgery.  Wear an absorbent pad or soft cotton gauze in your underwear until the drainage stops. 6. ACTIVITIES as tolerated:   a. You may resume regular (light) daily activities beginning the next day-such as daily self-care, walking, climbing stairs-gradually increasing activities as tolerated.  If you can walk 30 minutes without difficulty, it is safe to try more intense activity such as jogging, treadmill, bicycling, low-impact aerobics, swimming, etc. b. Save the most intensive and strenuous activity for last such as sit-ups, heavy lifting, contact sports, etc  Refrain from any heavy lifting or straining until you are off narcotics for pain control.   c. DO NOT PUSH THROUGH PAIN.  Let pain be your guide: If it hurts to  do something, don't do it.  Pain is your body warning you to avoid that activity for another week until the pain goes down. d. You may drive when you are no longer taking prescription pain medication, you can comfortably sit for long periods of time, and you can safely maneuver your car and apply brakes. e. Dennis Bast may have sexual intercourse when it is comfortable.  7. FOLLOW UP in our office a. Please call [facility] at [phone] to set up an appointment to see your surgeon  in the office for a follow-up appointment approximately 2 weeks after your surgery. b. Make sure that you call for this appointment the day you arrive home to insure a convenient appointment time. 10. IF YOU HAVE DISABILITY OR FAMILY LEAVE FORMS, BRING THEM TO THE OFFICE FOR PROCESSING.  DO NOT GIVE THEM TO YOUR DOCTOR.        WHEN TO CALL us [phone]: 1. Poor pain control 2. Reactions / problems with new medications (rash/itching, nausea, etc)  3. Fever over 101.5 F (38.5 C) 4. Inability to urinate 5. Nausea and/or vomiting 6. Worsening swelling or bruising 7. Continued bleeding from incision. 8. Increased pain, redness, or drainage from the incision  The clinic staff is available to answer your questions during regular business hours (8:30am-5pm).  Please don't hesitate to call and ask to speak to one of our nurses for clinical concerns.   A surgeon from [facility] is always on call at the hospitals   If you have a medical emergency, go to the nearest emergency room or call 911.    [facility], [place], [place], [place], [place]  [address] ? MAIN: [phone] ? TOLL FREE: [phone] ? FAX [phone] [email or web address]  STOP SMOKING!  We strongly recommend that you stop smoking.  Smoking increases the risk of surgery including infection in the form of an open wound, pus formation, abscess, hernia at an incision on the abdomen, etc.  You have  an increased risk of other MAJOR complications such as stroke, heart attack, forming clots in the leg and/or lungs, and death.    Smoking Cessation Quitting smoking is important to your health and has many advantages. However, it is not always easy to quit since nicotine is a very addictive drug. Often times, people try 3 times or more before being able to quit. This document explains the best ways for you to prepare to quit smoking. Quitting takes hard work and a lot of effort, but you can do it. ADVANTAGES OF QUITTING SMOKING  You will live longer, feel better, and live better.  Your body will feel the impact of quitting smoking almost immediately.  Within 20 minutes, blood pressure decreases. Your pulse returns to its normal level.  After 8 hours, carbon monoxide levels in the blood return to normal. Your oxygen level increases.  After 24 hours, the chance of having a heart attack starts to decrease. Your breath, hair, and body stop smelling like smoke.  After 48 hours, damaged nerve endings begin to recover. Your sense of taste and smell improve.  After 72 hours, the body is virtually free of nicotine. Your bronchial tubes relax and breathing becomes easier.  After 2 to 12 weeks, lungs can hold more air. Exercise becomes easier and circulation improves.  The risk of having a heart attack, stroke, cancer, or lung disease is greatly reduced.  After 1 year, the risk of coronary heart disease is cut in half.  After 5 years, the risk of stroke falls to the same as a nonsmoker.  After 10 years, the risk of lung cancer is cut in half and the risk of other cancers decreases significantly.  After 15 years, the risk of coronary heart disease drops, usually to the level of a nonsmoker.  If you are pregnant, quitting smoking will improve your chances of having a healthy baby.  The people you live with, especially any children, will be healthier.  You will have extra money to  spend on  things other than cigarettes. QUESTIONS TO THINK ABOUT BEFORE ATTEMPTING TO QUIT You may want to talk about your answers with your caregiver.  Why do you want to quit?  If you tried to quit in the past, what helped and what did not?  What will be the most difficult situations for you after you quit? How will you plan to handle them?  Who can help you through the tough times? Your family? Friends? A caregiver?  What pleasures do you get from smoking? What ways can you still get pleasure if you quit? Here are some questions to ask your caregiver:  How can you help me to be successful at quitting?  What medicine do you think would be best for me and how should I take it?  What should I do if I need more help?  What is smoking withdrawal like? How can I get information on withdrawal? GET READY  Set a quit date.  Change your environment by getting rid of all cigarettes, ashtrays, matches, and lighters in your home, car, or work. Do not let people smoke in your home.  Review your past attempts to quit. Think about what worked and what did not. GET SUPPORT AND ENCOURAGEMENT You have a better chance of being successful if you have help. You can get support in many ways.  Tell your family, friends, and co-workers that you are going to quit and need their support. Ask them not to smoke around you.  Get individual, group, or telephone counseling and support. Programs are available at [employer] and health centers. Call your local health department for information about programs in your area.  Spiritual beliefs and practices may help some smokers quit.  Download a "quit meter" on your computer to keep track of quit statistics, such as how long you have gone without smoking, cigarettes not smoked, and money saved.  Get a self-help book about quitting smoking and staying off of tobacco. [place] yourself from urges to smoke. Talk to someone, go for a  walk, or occupy your time with a task.  Change your normal routine. Take a different route to work. Drink tea instead of coffee. Eat breakfast in a different place.  Reduce your stress. Take a hot bath, exercise, or read a book.  Plan something enjoyable to do every day. Reward yourself for not smoking.  Explore interactive web-based programs that specialize in helping you quit. GET MEDICINE AND USE IT CORRECTLY Medicines can help you stop smoking and decrease the urge to smoke. Combining medicine with the above behavioral methods and support can greatly increase your chances of successfully quitting smoking.  Nicotine replacement therapy helps deliver nicotine to your body without the negative effects and risks of smoking. Nicotine replacement therapy includes nicotine gum, lozenges, inhalers, nasal sprays, and skin patches. Some may be available over-the-counter and others require a prescription.  Antidepressant medicine helps people abstain from smoking, but how this works is unknown. This medicine is available by prescription.  Nicotinic receptor partial agonist medicine simulates the effect of nicotine in your brain. This medicine is available by prescription. Ask your caregiver for advice about which medicines to use and how to use them based on your health history. Your caregiver will tell you what side effects to look out for if you choose to be on a medicine or therapy. Carefully read the information on the package. Do not use any other product containing nicotine while using  a nicotine replacement product.  RELAPSE OR DIFFICULT SITUATIONS Most relapses occur within the first 3 months after quitting. Do not be discouraged if you start smoking again. Remember, most people try several times before finally quitting. You may have symptoms of withdrawal because your body is used to nicotine. You may crave cigarettes, be irritable, feel very hungry, cough often, get headaches, or have  difficulty concentrating. The withdrawal symptoms are only temporary. They are strongest when you first quit, but they will go away within 10 14 days. To reduce the chances of relapse, try to:  Avoid drinking alcohol. Drinking lowers your chances of successfully quitting.  Reduce the amount of caffeine you consume. Once you quit smoking, the amount of caffeine in your body increases and can give you symptoms, such as a rapid heartbeat, sweating, and anxiety.  Avoid smokers because they can make you want to smoke.  Do not let weight gain distract you. Many smokers will gain weight when they quit, usually less than 10 pounds. Eat a healthy diet and stay active. You can always lose the weight gained after you quit.  Find ways to improve your mood other than smoking. FOR MORE INFORMATION  www.smokefree.gov    While it can be one of the most difficult things to do, the [place] community has programs to help you stop.  Consider talking with your primary care physician about options.  Also, Smoking Cessation classes are available through the [facility]:  The smoking cessation program is a proven-effective program from the American Lung Association. The program is available for anyone 66 and older who currently smokes. The program lasts for 7 weeks and is 8 sessions. Each class will be approximately 1 1/2 hours. The program is every Tuesday.  All classes are 12-1:30pm and same location.  Event Location Information:  Location: [facility] 2nd Floor Conference Room 2-037; located next to [facility] cross streets: [place] Entrance into the [facility] is adjacent to the [employer] main entrance. The conference room is located on the 2nd floor.  Parking Instructions: Visitor parking is adjacent to [employer] main entrance and the [place]    A smoking cessation program is also offered  through the [facility]. Register online at ClickDebate.gl or call [phone] for more information.   Tobacco cessation counseling is available at [facility]. Call [phone] for a free appointment.   Tobacco cessation classes also are available through the [facility] in [place]. For information, call [phone].   The Patient Education Network features videos on tobacco cessation. Please consult your listings in the center of this book to find instructions on how to access this resource.   If you want more information, ask your nurse.

## 2014-02-02 ENCOUNTER — Telehealth: Payer: Self-pay | Admitting: Endocrinology

## 2014-02-02 MED ORDER — HYDROCODONE-ACETAMINOPHEN 10-325 MG PO TABS: 1.0000 | ORAL_TABLET | ORAL | Status: DC | PRN

## 2014-02-02 NOTE — Telephone Encounter (Signed)
ok 

## 2014-02-02 NOTE — Telephone Encounter (Signed)
Patient is requesting a new hydrocodone rx.

## 2014-02-02 NOTE — Telephone Encounter (Signed)
Patient calling back regarding this.  °

## 2014-02-02 NOTE — Telephone Encounter (Signed)
Pt would like a refill on hydrocodone. Pt was last seen on 01/05/2014 and rx was last prescribed on 12/12/2013.  Please advise during Dr. Cordelia Pen absence. Thanks!

## 2014-02-02 NOTE — Telephone Encounter (Signed)
Pt advised via voice mail that rx is ready for pick up. Rx placed up front.

## 2014-02-07 ENCOUNTER — Other Ambulatory Visit: Payer: Self-pay | Admitting: Urology

## 2014-03-02 ENCOUNTER — Telehealth: Payer: Self-pay

## 2014-03-02 MED ORDER — HYDROCODONE-ACETAMINOPHEN 10-325 MG PO TABS: 1.0000 | ORAL_TABLET | ORAL | Status: DC | PRN

## 2014-03-02 NOTE — Telephone Encounter (Signed)
i printed 

## 2014-03-02 NOTE — Telephone Encounter (Signed)
Pt is requesting a refill of his Hydrocodone.  Thanks!

## 2014-03-03 NOTE — Telephone Encounter (Signed)
Pt picked up rx on 03/02/2014.

## 2014-03-14 ENCOUNTER — Encounter (HOSPITAL_BASED_OUTPATIENT_CLINIC_OR_DEPARTMENT_OTHER): Payer: Self-pay | Admitting: *Deleted

## 2014-03-14 NOTE — Progress Notes (Signed)
SPOKE W/ WIFE. NPO AFTER MN. ARRIVE AT 0600. NEEDS ISTAT AND EKG. WILL DO HIBICLENS SHOWER HS BEFORE AND AM DOS. MAY TAKE NORCO AM DOS W/ SIPS OF WATER.

## 2014-03-14 NOTE — Progress Notes (Signed)
03/14/14 1201  OBSTRUCTIVE SLEEP APNEA  Have you ever been diagnosed with sleep apnea through a sleep study? No  Do you snore loudly (loud enough to be heard through closed doors)?  1  Do you often feel tired, fatigued, or sleepy during the daytime? 0  Has anyone observed you stop breathing during your sleep? 0  Do you have, or are you being treated for high blood pressure? 1  BMI more than 35 kg/m2? 0  Age over 57 years old? 1  Neck circumference greater than 40 cm/16 inches? 1  Gender: 1  Obstructive Sleep Apnea Score 5  Score 4 or greater  Results sent to PCP

## 2014-03-20 ENCOUNTER — Encounter (HOSPITAL_BASED_OUTPATIENT_CLINIC_OR_DEPARTMENT_OTHER)
Admission: RE | Disposition: A | Discharge status: Home / Self Care | Payer: Self-pay | Source: Ambulatory Visit | Attending: Surgery

## 2014-03-20 ENCOUNTER — Ambulatory Visit (HOSPITAL_BASED_OUTPATIENT_CLINIC_OR_DEPARTMENT_OTHER): Payer: Medicare Other | Admitting: Anesthesiology

## 2014-03-20 ENCOUNTER — Encounter (HOSPITAL_BASED_OUTPATIENT_CLINIC_OR_DEPARTMENT_OTHER): Payer: Medicare Other | Admitting: Anesthesiology

## 2014-03-20 ENCOUNTER — Ambulatory Visit: Payer: Medicare Other | Admitting: Endocrinology

## 2014-03-20 ENCOUNTER — Ambulatory Visit (HOSPITAL_BASED_OUTPATIENT_CLINIC_OR_DEPARTMENT_OTHER)
Admission: RE | Admit: 2014-03-20 | Discharge: 2014-03-20 | Disposition: A | Discharge status: Home / Self Care | Payer: Medicare Other | Source: Ambulatory Visit | Attending: Surgery | Admitting: Surgery

## 2014-03-20 ENCOUNTER — Encounter (HOSPITAL_BASED_OUTPATIENT_CLINIC_OR_DEPARTMENT_OTHER): Payer: Self-pay | Admitting: *Deleted

## 2014-03-20 DIAGNOSIS — I10 Essential (primary) hypertension: Secondary | ICD-10-CM | POA: Insufficient documentation

## 2014-03-20 DIAGNOSIS — J45909 Unspecified asthma, uncomplicated: Secondary | ICD-10-CM | POA: Diagnosis not present

## 2014-03-20 DIAGNOSIS — E1149 Type 2 diabetes mellitus with other diabetic neurological complication: Secondary | ICD-10-CM | POA: Insufficient documentation

## 2014-03-20 DIAGNOSIS — K219 Gastro-esophageal reflux disease without esophagitis: Secondary | ICD-10-CM | POA: Insufficient documentation

## 2014-03-20 DIAGNOSIS — A630 Anogenital (venereal) warts: Secondary | ICD-10-CM | POA: Diagnosis present

## 2014-03-20 DIAGNOSIS — Z8551 Personal history of malignant neoplasm of bladder: Secondary | ICD-10-CM | POA: Diagnosis not present

## 2014-03-20 DIAGNOSIS — Z794 Long term (current) use of insulin: Secondary | ICD-10-CM | POA: Insufficient documentation

## 2014-03-20 DIAGNOSIS — F17200 Nicotine dependence, unspecified, uncomplicated: Secondary | ICD-10-CM | POA: Diagnosis not present

## 2014-03-20 DIAGNOSIS — E1142 Type 2 diabetes mellitus with diabetic polyneuropathy: Secondary | ICD-10-CM | POA: Diagnosis not present

## 2014-03-20 HISTORY — PX: CO2 LASER APPLICATION: SHX5778

## 2014-03-20 HISTORY — DX: Personal history of other diseases of the musculoskeletal system and connective tissue: Z87.39

## 2014-03-20 HISTORY — DX: Unspecified symptoms and signs involving the genitourinary system: R39.9

## 2014-03-20 HISTORY — DX: Personal history of malignant neoplasm of bladder: Z85.51

## 2014-03-20 HISTORY — DX: Allergic rhinitis, unspecified: J30.9

## 2014-03-20 HISTORY — DX: Presence of dental prosthetic device (complete) (partial): Z97.2

## 2014-03-20 HISTORY — DX: Type 2 diabetes mellitus without complications: E11.9

## 2014-03-20 HISTORY — DX: Hyperlipidemia, unspecified: E78.5

## 2014-03-20 HISTORY — PX: LASER ABLATION CONDOLAMATA: SHX5941

## 2014-03-20 HISTORY — DX: Other specified personal risk factors, not elsewhere classified: Z91.89

## 2014-03-20 HISTORY — DX: Long term (current) use of insulin: Z79.4

## 2014-03-20 LAB — POCT I-STAT, CHEM 8
BUN: 12 mg/dL (ref 6–23)
CHLORIDE: 102 meq/L (ref 96–112)
Calcium, Ion: 1.28 mmol/L — ABNORMAL HIGH (ref 1.12–1.23)
Creatinine, Ser: 1 mg/dL (ref 0.50–1.35)
GLUCOSE: 116 mg/dL — AB (ref 70–99)
HEMATOCRIT: 53 % — AB (ref 39.0–52.0)
HEMOGLOBIN: 18 g/dL — AB (ref 13.0–17.0)
POTASSIUM: 3.8 meq/L (ref 3.7–5.3)
SODIUM: 142 meq/L (ref 137–147)
TCO2: 26 mmol/L (ref 0–100)

## 2014-03-20 LAB — GLUCOSE, CAPILLARY: Glucose-Capillary: 139 mg/dL — ABNORMAL HIGH (ref 70–99)

## 2014-03-20 SURGERY — ABLATION, CONDYLOMA, USING LASER
Anesthesia: General | Site: Penis

## 2014-03-20 MED ORDER — BUPIVACAINE LIPOSOME 1.3 % IJ SUSP
INTRAMUSCULAR | Status: DC | PRN
  Administered 2014-03-20: 20 mL

## 2014-03-20 MED ORDER — CHLORHEXIDINE GLUCONATE 4 % EX LIQD
1.0000 "application " | Freq: Once | CUTANEOUS | Status: DC
  Filled 2014-03-20: qty 15

## 2014-03-20 MED ORDER — HYDROCODONE-ACETAMINOPHEN 10-325 MG PO TABS: 1.0000 | ORAL_TABLET | ORAL | Status: DC | PRN

## 2014-03-20 MED ORDER — SODIUM CHLORIDE 0.9 % IJ SOLN
INTRAMUSCULAR | Status: DC | PRN
  Administered 2014-03-20: 20 mL

## 2014-03-20 MED ORDER — CEFAZOLIN SODIUM-DEXTROSE 2-3 GM-% IV SOLR
2.0000 g | INTRAVENOUS | Status: AC
  Administered 2014-03-20: 2 g via INTRAVENOUS
  Filled 2014-03-20: qty 50

## 2014-03-20 MED ORDER — PROMETHAZINE HCL 25 MG/ML IJ SOLN
6.2500 mg | INTRAMUSCULAR | Status: DC | PRN
  Filled 2014-03-20: qty 1

## 2014-03-20 MED ORDER — BACITRACIN-NEOMYCIN-POLYMYXIN OINTMENT TUBE
TOPICAL_OINTMENT | CUTANEOUS | Status: DC | PRN
  Administered 2014-03-20: 1 via TOPICAL

## 2014-03-20 MED ORDER — METRONIDAZOLE IN NACL 5-0.79 MG/ML-% IV SOLN
500.0000 mg | INTRAVENOUS | Status: AC
  Administered 2014-03-20: .5 g via INTRAVENOUS
  Filled 2014-03-20: qty 100

## 2014-03-20 MED ORDER — KETOROLAC TROMETHAMINE 30 MG/ML IJ SOLN
INTRAMUSCULAR | Status: DC | PRN
  Administered 2014-03-20: 30 mg via INTRAVENOUS

## 2014-03-20 MED ORDER — LACTATED RINGERS IV SOLN
INTRAVENOUS | Status: DC
  Administered 2014-03-20 (×2): via INTRAVENOUS
  Filled 2014-03-20: qty 1000

## 2014-03-20 MED ORDER — FENTANYL CITRATE 0.05 MG/ML IJ SOLN
INTRAMUSCULAR | Status: AC
  Filled 2014-03-20: qty 2

## 2014-03-20 MED ORDER — CEFAZOLIN SODIUM-DEXTROSE 2-3 GM-% IV SOLR
2.0000 g | INTRAVENOUS | Status: DC
  Filled 2014-03-20: qty 50

## 2014-03-20 MED ORDER — MIDAZOLAM HCL 5 MG/5ML IJ SOLN
INTRAMUSCULAR | Status: DC | PRN
  Administered 2014-03-20: 2 mg via INTRAVENOUS

## 2014-03-20 MED ORDER — LIDOCAINE HCL (CARDIAC) 20 MG/ML IV SOLN
INTRAVENOUS | Status: DC | PRN
  Administered 2014-03-20: 80 mg via INTRAVENOUS

## 2014-03-20 MED ORDER — ONDANSETRON HCL 4 MG/2ML IJ SOLN
INTRAMUSCULAR | Status: DC | PRN
  Administered 2014-03-20: 4 mg via INTRAVENOUS

## 2014-03-20 MED ORDER — OXYCODONE HCL 5 MG PO TABS
5.0000 mg | ORAL_TABLET | Freq: Once | ORAL | Status: DC | PRN
  Filled 2014-03-20: qty 1

## 2014-03-20 MED ORDER — FENTANYL CITRATE 0.05 MG/ML IJ SOLN
25.0000 ug | INTRAMUSCULAR | Status: DC | PRN
  Filled 2014-03-20: qty 1

## 2014-03-20 MED ORDER — PROPOFOL 10 MG/ML IV BOLUS
INTRAVENOUS | Status: DC | PRN
  Administered 2014-03-20: 200 mg via INTRAVENOUS

## 2014-03-20 MED ORDER — CEFAZOLIN SODIUM-DEXTROSE 2-3 GM-% IV SOLR
INTRAVENOUS | Status: AC
  Filled 2014-03-20: qty 50

## 2014-03-20 MED ORDER — DEXAMETHASONE SODIUM PHOSPHATE 4 MG/ML IJ SOLN
INTRAMUSCULAR | Status: DC | PRN
  Administered 2014-03-20: 4 mg via INTRAVENOUS

## 2014-03-20 MED ORDER — ACETIC ACID 5 % SOLN
Status: DC | PRN
  Administered 2014-03-20: 1 via TOPICAL

## 2014-03-20 MED ORDER — FENTANYL CITRATE 0.05 MG/ML IJ SOLN
INTRAMUSCULAR | Status: DC | PRN
  Administered 2014-03-20 (×6): 50 ug via INTRAVENOUS

## 2014-03-20 MED ORDER — OXYCODONE HCL 5 MG/5ML PO SOLN
5.0000 mg | Freq: Once | ORAL | Status: DC | PRN
  Filled 2014-03-20: qty 5

## 2014-03-20 MED ORDER — FENTANYL CITRATE 0.05 MG/ML IJ SOLN
INTRAMUSCULAR | Status: AC
  Filled 2014-03-20: qty 4

## 2014-03-20 MED ORDER — MIDAZOLAM HCL 2 MG/2ML IJ SOLN
INTRAMUSCULAR | Status: AC
  Filled 2014-03-20: qty 2

## 2014-03-20 MED ORDER — BUPIVACAINE LIPOSOME 1.3 % IJ SUSP
20.0000 mL | INTRAMUSCULAR | Status: DC
  Filled 2014-03-20: qty 20

## 2014-03-20 SURGICAL SUPPLY — 49 items
ADH SKN CLS APL DERMABOND .7 (GAUZE/BANDAGES/DRESSINGS)
APL SKNCLS STERI-STRIP NONHPOA (GAUZE/BANDAGES/DRESSINGS)
BENZOIN TINCTURE PRP APPL 2/3 (GAUZE/BANDAGES/DRESSINGS) IMPLANT
BLADE SURG 15 STRL LF DISP TIS (BLADE) ×2 IMPLANT
BLADE SURG 15 STRL SS (BLADE) ×4
BNDG GAUZE ELAST 4 BULKY (GAUZE/BANDAGES/DRESSINGS) ×3 IMPLANT
BRIEF STRETCH FOR OB PAD LRG (UNDERPADS AND DIAPERS) ×4 IMPLANT
CLEANER CAUTERY TIP 5X5 PAD (MISCELLANEOUS) ×2 IMPLANT
COVER MAYO STAND STRL (DRAPES) IMPLANT
COVER TABLE BACK 60X90 (DRAPES) IMPLANT
DECANTER SPIKE VIAL GLASS SM (MISCELLANEOUS) ×4 IMPLANT
DERMABOND ADVANCED (GAUZE/BANDAGES/DRESSINGS)
DERMABOND ADVANCED .7 DNX12 (GAUZE/BANDAGES/DRESSINGS) ×1 IMPLANT
DRAPE UTILITY XL STRL (DRAPES) ×1 IMPLANT
ELECT NDL TIP 2.8 STRL (NEEDLE) ×1 IMPLANT
ELECT NEEDLE TIP 2.8 STRL (NEEDLE) ×4 IMPLANT
ELECT REM PT RETURN 9FT ADLT (ELECTROSURGICAL) ×4
ELECTRODE REM PT RTRN 9FT ADLT (ELECTROSURGICAL) ×2 IMPLANT
GAUZE SPONGE 4X4 12PLY STRL LF (GAUZE/BANDAGES/DRESSINGS) ×8 IMPLANT
GAUZE SPONGE 4X4 16PLY XRAY LF (GAUZE/BANDAGES/DRESSINGS) ×3 IMPLANT
GLOVE BIO SURGEON STRL SZ8 (GLOVE) ×3 IMPLANT
GLOVE BIOGEL PI IND STRL 8 (GLOVE) ×3 IMPLANT
GLOVE BIOGEL PI INDICATOR 8 (GLOVE) ×4
GLOVE ECLIPSE 8.0 STRL XLNG CF (GLOVE) ×4 IMPLANT
GLOVE SURG SS PI 7.5 STRL IVOR (GLOVE) ×3 IMPLANT
GOWN PREVENTION PLUS XLARGE (GOWN DISPOSABLE) ×4 IMPLANT
GOWN STRL REUS W/TWL XL LVL3 (GOWN DISPOSABLE) ×9 IMPLANT
NEEDLE HYPO 22GX1.5 SAFETY (NEEDLE) ×3 IMPLANT
PACK BASIN DAY SURGERY FS (CUSTOM PROCEDURE TRAY) ×4 IMPLANT
PAD CLEANER CAUTERY TIP 5X5 (MISCELLANEOUS) ×2
PENCIL BUTTON HOLSTER BLD 10FT (ELECTRODE) ×4 IMPLANT
SHEET MEDIUM DRAPE 40X70 STRL (DRAPES) IMPLANT
SPONGE LAP 18X18 X RAY DECT (DISPOSABLE) ×3 IMPLANT
SPONGE LAP 4X18 X RAY DECT (DISPOSABLE) ×3 IMPLANT
SURGILUBE 2OZ TUBE FLIPTOP (MISCELLANEOUS) ×4 IMPLANT
SUT CHROMIC 3 0 SH 27 (SUTURE) ×3 IMPLANT
SUT VIC AB 3-0 SH 18 (SUTURE) ×4 IMPLANT
SYR CONTROL 10ML LL (SYRINGE) ×3 IMPLANT
TOWEL OR 17X24 6PK STRL BLUE (TOWEL DISPOSABLE) ×7 IMPLANT
TOWEL OR NON WOVEN STRL DISP B (DISPOSABLE) ×1 IMPLANT
TRAY DSU PREP LF (CUSTOM PROCEDURE TRAY) ×4 IMPLANT
TUBE CONNECTING 12'X1/4 (SUCTIONS) ×1
TUBE CONNECTING 12X1/4 (SUCTIONS) ×3 IMPLANT
TUBING STERILE (MISCELLANEOUS) ×4 IMPLANT
UNDERPAD 30X30 INCONTINENT (UNDERPADS AND DIAPERS) ×4 IMPLANT
VACUUM HOSE 7/8X10 W/ WAND (MISCELLANEOUS) ×2 IMPLANT
VACUUM HOSE/TUBING 7/8INX6FT (MISCELLANEOUS) IMPLANT
WATER STERILE IRR 1000ML POUR (IV SOLUTION) ×4 IMPLANT
YANKAUER SUCT BULB TIP NO VENT (SUCTIONS) IMPLANT

## 2014-03-20 NOTE — Op Note (Signed)
PATIENT:  [name]  PRE-OPERATIVE DIAGNOSIS: Extensive genital condyloma  POST-OPERATIVE DIAGNOSIS: Same  PROCEDURE: CO2 laser fulguration of extensive genital condyloma (penile, scrotal, perineal and inner thigh)  SURGEON:  Claybon Jabs  INDICATION: [name] is a 58 year old male who was noted to have extensive condylomatous disease involving the penis, scrotum, perineum, inner thigh and perianal region. He is brought to the operating room today for CO2 laser fulguration/destruction of his genital condyloma in conjunction with treatment of his perianal condyloma by Dr. Johney [place]  ANESTHESIA:  General  EBL:  None  DRAINS: None  LOCAL MEDICATIONS USED:  None  SPECIMEN:  None  Description of procedure: After informed consent the patient was taken to the operating room and placed on the table in a supine position. General anesthesia was then administered. Once fully anesthetized the patient was moved to the dorsal lithotomy position and the genitalia were sterilely prepped and draped in standard fashion. An official timeout was then performed.  Examination revealed persistent penile shaft edema of the chronic nature with condylomatous lesions found involving the distal shaft skin as well as some lesions involving the glans at the coronal region. In addition lesions were noted at the base of the penis with a few lesions on the scrotum near the base of the penis as well as in both thigh creases extending up toward the inguinal region along this line.  The CO2 laser was set on 7 W and I treated all of these lesions mentioned above. There was a large conglomeration of condylomatous lesion in the left inner thigh crease it was not treated with CO2 laser as it was felt too large to be treated effectively and will be excised by Dr. Johney [place]. His portion of the procedure will be dictated by him separately.  The patient tolerated the portion of the procedure that I was involved in with no  complications. He will be discharged with pain medication and topical ointment.    PLAN OF CARE: Discharge to home after PACU

## 2014-03-20 NOTE — H&P (Addendum)
[address]  [name] [name], [place] [address] Phone: [phone] FAX: Harrison Canter  04-27-1956 [id number]  CARE TEAM:  PCP: Paul Shin, MD  Outpatient Care Team: Patient Care Team: Paul Shin, MD as PCP - General Paul Castle, MD as Consulting Physician (Gastroenterology) Paul Jabs, MD as Consulting Physician (Urology)  Inpatient Treatment Team: Treatment Team: Attending Provider: [name], MD  Reason for visit: Recurrent perianal masses.   Pleasant male. History of condyloma. I removed/ablated a large volume in 2008. Treated small recurrences for a year. Had more significant recurrence in 2009. I recommended surgery. Lost to followup. History of significant hidradenitis requiring numerous resections in 1990s.  Chronic penile lymphedema.  Apparently he developed some hematuria. Was found to have a bladder tumor. Followed by urology. That is stabilized. Was found to have a more of masses on the anus. Sent to gastroenterology Surgical evaluation requested. He wondered if they were hemorrhoids. He denies fevers or chills. Occasional loose bowel movements but no severe diarrhea.    Past Medical History  Diagnosis Date  . Diabetic neuropathy   . Smokers' cough   . Productive cough   . GERD (gastroesophageal reflux disease)   . Hyperlipidemia   . Allergic rhinitis   . History of bladder cancer     s/p  turbt  2013/   transitional cell carcinoma--   . Hypertension   . Lower urinary tract symptoms (LUTS)   . Type 2 diabetes mellitus with insulin therapy     monitor by  dr ellsion  . Claudication of calf muscles   . History of gout   . Wears dentures     upper  . At risk for sleep apnea     STOP-BANG= 5   SENT TO PCP 03-14-2014    Past Surgical History  Procedure Laterality Date  . Laser ablation of penile and perianal warts  07-29-2007  Dr. Johney Maine  . Left shoulder surgery  2003   . Cataract extraction w/ intraocular lens implant Right   . Transurethral resection of bladder tumor  05/21/2012    Procedure: TRANSURETHRAL RESECTION OF BLADDER TUMOR (TURBT);  Surgeon: Paul Jabs, MD;  Location: [facility];  Service: Urology;  Laterality: N/A;     . Inguinal hidradenitis excision  1998, 1999  . Perineal hidradenitis excision  1998, 1999  . Axillary hidradenitis excision  1997    History   Social History  . Marital Status: Married    Spouse Name: N/A    Number of Children: N/A  . Years of Education: N/A   Occupational History  . Not on file.   Social History Main Topics  . Smoking status: Current Every Day Smoker -- 1.00 packs/day for 40 years    Types: Cigarettes  . Smokeless tobacco: Never Used  . Alcohol Use: No  . Drug Use: No  . Sexual Activity: Not on file   Other Topics Concern  . Not on file   Social History Narrative  . No narrative on file    Family History  Problem Relation Age of Onset  . Colon cancer Neg Hx   . Esophageal cancer Neg Hx   . Pancreatic cancer Neg Hx   . Prostate cancer Neg Hx   . Kidney disease Neg Hx   . Liver disease Neg Hx   . Lung cancer Neg Hx   . Diabetes Maternal Aunt  x 2  . Hypertension Mother   . Cancer Father     lung ca    Current Facility-Administered Medications  Medication Dose Route Frequency Provider Last Rate Last Dose  . bupivacaine liposome (EXPAREL) 1.3 % injection 266 mg  20 mL Infiltration On Call to OR Paul Hector, MD      . ceFAZolin (ANCEF) IVPB 2 g/50 mL premix  2 g Intravenous On Call to OR Paul Hector, MD      . ceFAZolin (ANCEF) IVPB 2 g/50 mL premix  2 g Intravenous 30 min Pre-Op Paul Jabs, MD      . chlorhexidine (HIBICLENS) 4 % liquid 1 application  1 application Topical Once Paul Hector, MD      . Derrill Memo ON 03/21/2014] chlorhexidine (HIBICLENS) 4 % liquid 1 application  1 application Topical Once Paul Hector, MD      . lactated ringers  infusion   Intravenous Continuous Paul Hector, MD 50 mL/hr at 03/20/14 0600    . metroNIDAZOLE (FLAGYL) IVPB 500 mg  500 mg Intravenous On Call to Rosalia, MD         No Known Allergies  ROS: Constitutional:  No fevers, chills, sweats.  Weight stable Eyes:  No vision changes, No discharge HENT:  No sore throats, nasal drainage Lymph: No neck swelling, No bruising easily Pulmonary:  No cough, productive sputum CV: No orthopnea, PND  No exertional chest/neck/shoulder/arm pain. GI:  No personal nor family history of GI/colon cancer, inflammatory bowel disease, irritable bowel syndrome, allergy such as Celiac Sprue, dietary/dairy problems, colitis, ulcers nor gastritis.  No recent sick contacts/gastroenteritis.  No travel outside the country.  No changes in diet. Renal: No UTIs, No hematuria Genital:  No drainage, bleeding, masses Musculoskeletal: No severe joint pain.  Good ROM major joints Skin:  No sores or lesions.  No rashes Heme/Lymph:  No easy bleeding.  No swollen lymph nodes Neuro: No focal weakness/numbness.  No seizures Psych: No suicidal ideation.  No hallucinations  BP 142/68  Pulse 64  Temp(Src) 97.8 F (36.6 C) (Oral)  Resp 18  Ht 5\' 9"  (1.753 m)  Wt 195 lb (88.451 kg)  BMI 28.78 kg/m2  SpO2 96%  Physical Exam: General: Pt awake/alert/oriented x4 in no major acute distress Eyes: PERRL, normal EOM. Sclera nonicteric Neuro: CN II-XII intact w/o focal sensory/motor deficits. Lymph: No head/neck/groin lymphadenopathy Psych:  No delerium/psychosis/paranoia HENT: Normocephalic, Mucus membranes moist.  No thrush Neck: Supple, No tracheal deviation Chest: No pain.  Good respiratory excursion. CV:  Pulses intact.  Regular rhythm Abdomen: Soft, Nondistended.  Nontender.  No incarcerated hernias. Ext:  SCDs BLE.  No significant edema.  No cyanosis Skin: No petechiae / purpurea.  No major sores Musculoskeletal: No severe joint pain.  Good ROM major  joints   Results:   Labs: Results for orders placed during the hospital encounter of 03/20/14 (from the past 48 hour(s))  POCT I-STAT, CHEM 8     Status: Abnormal   Collection Time    03/20/14  6:15 AM      Result Value Ref Range   Sodium 142  137 - 147 mEq/L   Potassium 3.8  3.7 - 5.3 mEq/L   Chloride 102  96 - 112 mEq/L   BUN 12  6 - 23 mg/dL   Creatinine, Ser 1.00  0.50 - 1.35 mg/dL   Glucose, Bld 116 (*) 70 - 99 mg/dL   Calcium, Ion 1.28 (*)  1.12 - 1.23 mmol/L   TCO2 26  0 - 100 mmol/L   Hemoglobin 18.0 (*) 13.0 - 17.0 g/dL   HCT 53.0 (*) 39.0 - 52.0 %    Imaging / Studies: No results found.  Medications / Allergies: per chart  Antibiotics: Anti-infectives   Start     Dose/Rate Route Frequency Ordered Stop   03/20/14 8502  ceFAZolin (ANCEF) IVPB 2 g/50 mL premix     2 g 100 mL/hr over 30 Minutes Intravenous 30 min pre-op 03/20/14 7741     03/20/14 0620  ceFAZolin (ANCEF) IVPB 2 g/50 mL premix     2 g 100 mL/hr over 30 Minutes Intravenous On call to O.R. 03/20/14 2878 03/21/14 0559   03/20/14 6767  metroNIDAZOLE (FLAGYL) IVPB 500 mg    Comments:  Pharmacy may adjust dosing strength, interval, or rate of medication as needed for optimal therapy for the patient Send with patient on call to the OR.  Anesthesia to complete antibiotic administration <9min prior to incision per [facility].   500 mg 100 mL/hr over 60 Minutes Intravenous On call to O.R. 03/20/14 0620 03/21/14 0559      Assessment  Paul Paul  59 y.o. male  Day of Surgery  Procedure(s): EXAM UNDER ANESTHESIA, REMOVAL/ABLATION OF CONDYLOMATA PENIS,GROINS, ANUS CO2 LASER OF PENILE CONDOLOMA  Problem List:  Active Problems:   * No active hospital problems. 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   Plan:    I recommended removal and/or ablation with laser. Coordinating with his urologist, Dr Karsten Ro, do it at the same time.   The anatomy & physiology of  the anorectal region was discussed. The pathophysiology of anorectal warts and differential diagnosis was discussed. Natural history risks without surgery was discussed such as further growth and cancer. I stressed the importance of office follow-up to catch early recurrence & minimize/halt progression of disease. Interventions such as cauterization by topical agents were discussed.  The patient's symptoms are not adequately controlled by non-operative treatments. I feel the risks & problems of no surgery outweigh the operative risks; therefore, I recommended surgery to treat the anal warts by removal, ablation and/or cauterization.  Risks such as bleeding, infection, need for further treatment, heart attack, death, and other risks were discussed. I noted a good likelihood this will help address the problem. Goals of post-operative recovery were discussed as well. Possibility that this will not correct all symptoms was explained. Post-operative pain, bleeding, constipation, and other problems after surgery were discussed. We will work to minimize complications. Educational handouts further explaining the pathology, treatment options, and bowel regimen were given as well. Questions were answered. The patient expresses understanding & wishes to proceed with surgery.       Paul Paul, M.D., F.A.C.S. Gastrointestinal and Minimally Invasive Surgery [facility] [address] [place], [place] [address] [phone] Main / Paging   03/20/2014  Note: Portions of this report may have been transcribed using voice recognition software. Every effort was made to ensure accuracy; however, inadvertent computerized transcription errors may be present.   Any transcriptional errors that result from this process are unintentional.

## 2014-03-20 NOTE — Anesthesia Procedure Notes (Signed)
Procedure Name: LMA Insertion Date/Time: 03/20/2014 7:45 AM Performed by: Bethena Roys T Pre-anesthesia Checklist: Patient identified, Emergency Drugs available, Suction available and Patient being monitored Patient Re-evaluated:Patient Re-evaluated prior to inductionOxygen Delivery Method: Circle System Utilized Preoxygenation: Pre-oxygenation with 100% oxygen Intubation Type: IV induction Ventilation: Mask ventilation without difficulty LMA: LMA with gastric port inserted LMA Size: 5.0 Number of attempts: 1 Placement Confirmation: positive ETCO2 Tube secured with: Tape Dental Injury: Teeth and Oropharynx as per pre-operative assessment

## 2014-03-20 NOTE — Op Note (Signed)
03/20/2014  9:44 AM  PATIENT:  [name]  58 y.o. male  Patient Care Team: [name], MD as PCP - General Inda Castle, MD as Consulting Physician (Gastroenterology) Claybon Jabs, MD as Consulting Physician (Urology)  PRE-OPERATIVE DIAGNOSIS:  condolamata acuminatum of penis, groins, perianal region   POST-OPERATIVE DIAGNOSIS:  condolamata acuminatum of penis, groins, perianal region   PROCEDURE:  Procedure(s): EXAM UNDER ANESTHESIA, REMOVAL/ABLATION OF CONDYLOMATA PENIS,GROINS, ANUS, ANAL CANAL  SURGEON:  Surgeon(s): [name], MD Claybon Jabs, MD  ANESTHESIA:   local and general  EBL:  Total I/O In: 1000 [I.V.:1000] Out: -   Delay start of Pharmacological VTE agent (>24hrs) due to surgical blood loss or risk of bleeding:  no  DRAINS: none   SPECIMEN:  Source of Specimen:  Perianal warts/condylomata  DISPOSITION OF SPECIMEN:  PATHOLOGY  COUNTS:  YES  PLAN OF CARE: Discharge to home after PACU  PATIENT DISPOSITION:  PACU - hemodynamically stable.  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  Plan:   I recommended removal and/or ablation with laser. Coordinating with his urologist, Dr Karsten Ro, do it at the same time.  The anatomy & physiology of the anorectal region was discussed. The pathophysiology of anorectal warts and differential diagnosis was discussed. Natural history risks without surgery was discussed such as further growth and cancer. I stressed the importance of office follow-up to catch early recurrence & minimize/halt progression of disease. Interventions such as cauterization by topical agents were discussed.  The patient's symptoms are not adequately controlled by non-operative treatments. I feel the risks & problems of no surgery outweigh the operative risks; therefore, I recommended surgery to treat the anal warts by removal, ablation and/or cauterization.  Risks such as bleeding, infection, need for further  treatment, heart attack, death, and other risks were discussed. I noted a good likelihood this will help address the problem. Goals of post-operative recovery were discussed as well. Possibility that this will not correct all symptoms was explained. Post-operative pain, bleeding, constipation, and other problems after surgery were discussed. We will work to minimize complications. Educational handouts further explaining the pathology, treatment options, and bowel regimen were given as well. Questions were answered. The patient expresses understanding & wishes to proceed with surgery.    OR FINDINGS: Large volume of perianal condylomata.  2-30 mm in size.  Pedunculated.  Mostly soft.  Primarily within 5 cm of the anal verge.  A few scattered small condyloma in the distal anal canal.  More firm flat sessile condylomata in left greater than right groins.  Moderate volume under foreskin of the penis.  See urology operative report.  DESCRIPTION:   Informed consent was confirmed.  The patient underwent general anesthesia without difficulty.  Patient was positioned in low lithotomy.  Perineum and.  A region prepped and draped in a sterile fashion.  Surgical timeout confirmed plan.  Let Dr. Karsten Ro proceed with CO2 laser ablation of the penile and scrotal condylomata.  He also ablated in the right greater than left lower groin is.  Please see his note for details.  I took over the case.  I went over the left groin and removed and CO2 ablated some of the larger condylomata.  And focused in the perianal region.  The giant condyloma were excised using needle tip cautery.  I then proceeded to ablate the condylomata using a CO2 laser set from 7-15 W.  I painted the area with acetic acid.  I did not see  any precancerous changes nor white skin changes of concern.  I reinspected the entire region and ablated a small suspicious areas.  I assured hemostasis.  The patient is being extubated go to recovery room.  I  discussed intraoperative findings with the patient's wife.  She is familiar with his disease.  I had a long frank discussion with her.  Instructions are written.  She expressed understanding and appreciation.

## 2014-03-20 NOTE — Anesthesia Postprocedure Evaluation (Signed)
  Anesthesia Post-op Note  Patient: [name]  Procedure(s) Performed: Procedure(s) with comments: EXAM UNDER ANESTHESIA, REMOVAL/ABLATION OF CONDYLOMATA PENIS,GROINS, ANUS, ANAL CANAL (N/A) - groin and anus CO2 LASER APPLICATION,PENIS, GROIN, ANUS (N/A)  Patient Location: PACU  Anesthesia Type:General  Level of Consciousness: awake, alert  and oriented  Airway and Oxygen Therapy: Patient Spontanous Breathing  Post-op Pain: none  Post-op Assessment: Post-op Vital signs reviewed  Post-op Vital Signs: Reviewed  Last Vitals:  Filed Vitals:   03/20/14 1030  BP: 141/72  Pulse: 60  Temp:   Resp: 17    Complications: No apparent anesthesia complications

## 2014-03-20 NOTE — Transfer of Care (Signed)
Immediate Anesthesia Transfer of Care Note  Patient: [name]  Procedure(s) Performed: Procedure(s) with comments: EXAM UNDER ANESTHESIA, REMOVAL/ABLATION OF CONDYLOMATA PENIS,GROINS, ANUS, ANAL CANAL (N/A) - groin and anus  Patient Location: PACU  Anesthesia Type:General  Level of Consciousness: awake and sedated  Airway & Oxygen Therapy: Patient Spontanous Breathing and Patient connected to nasal cannula oxygen  Post-op Assessment: Report given to PACU RN  Post vital signs: Reviewed and stable  Complications: No apparent anesthesia complications

## 2014-03-20 NOTE — Discharge Instructions (Signed)
ANORECTAL SURGERY: POST OP INSTRUCTIONS  1. Take your usually prescribed home medications unless otherwise directed. 2. DIET: Follow a light bland diet the first 24 hours after arrival home, such as soup, liquids, crackers, etc.  Be sure to include lots of fluids daily.  Avoid fast food or heavy meals as your are more likely to get nauseated.  Eat a low fat the next few days after surgery.   3. PAIN CONTROL: a. Pain is best controlled by a usual combination of three different methods TOGETHER: i. Ice/Heat ii. Over the counter pain medication iii. Prescription pain medication b. Most patients will experience some swelling and discomfort in the anus/rectal area. and incisions.  Ice packs or heat (30-60 minutes up to 6 times a day) will help. Use ice for the first few days to help decrease swelling and bruising, then switch to heat such as warm towels, sitz baths, warm baths, etc to help relax tight/sore spots and speed recovery.  Some people prefer to use ice alone, heat alone, alternating between ice & heat.  Experiment to what works for you.  Swelling and bruising can take several weeks to resolve.   c. It is helpful to take an over-the-counter pain medication regularly for the first few weeks.  Choose one of the following that works best for you: i. Naproxen (Aleve, etc)  Two 220mg tabs twice a day ii. Ibuprofen (Advil, etc) Three 200mg tabs four times a day (every meal & bedtime) iii. Acetaminophen (Tylenol, etc) 500-650mg four times a day (every meal & bedtime) d. A  prescription for pain medication (such as oxycodone, hydrocodone, etc) should be given to you upon discharge.  Take your pain medication as prescribed.  i. If you are having problems/concerns with the prescription medicine (does not control pain, nausea, vomiting, rash, itching, etc), please call us [phone] to see if we need to switch you to a different pain medicine that will work better for you and/or control your side effect  better. ii. If you need a refill on your pain medication, please contact your pharmacy.  They will contact our office to request authorization. Prescriptions will not be filled after 5 pm or on week-ends.  Use a Sitz Bath 4-8 times a day for relief A sitz bath is a warm water bath taken in the sitting position that covers only the hips and buttocks. It may be used for either healing or hygiene purposes. Sitz baths are also used to relieve pain, itching, or muscle spasms. The water may contain medicine. Moist heat will help you heal and relax.  HOME CARE INSTRUCTIONS  Take 3 to 4 sitz baths a day. 1. Fill the bathtub half full with warm water. 2. Sit in the water and open the drain a little. 3. Turn on the warm water to keep the tub half full. Keep the water running constantly. 4. Soak in the water for 15 to 20 minutes. 5. After the sitz bath, pat the affected area dry first. SEEK MEDICAL CARE IF:  You get worse instead of better. Stop the sitz baths if you get worse.   4. KEEP YOUR BOWELS REGULAR a. The goal is one bowel movement a day b. Avoid getting constipated.  Between the surgery and the pain medications, it is common to experience some constipation.  Increasing fluid intake and taking a fiber supplement (such as Metamucil, Citrucel, FiberCon, MiraLax, etc) 1-2 times a day regularly will usually help prevent this problem from occurring.  A mild   laxative (prune juice, Milk of Magnesia, MiraLax, etc) should be taken according to package directions if there are no bowel movements after 48 hours. c. Watch out for diarrhea.  If you have many loose bowel movements, simplify your diet to bland foods & liquids for a few days.  Stop any stool softeners and decrease your fiber supplement.  Switching to mild anti-diarrheal medications (Kayopectate, Pepto Bismol) can help.  If this worsens or does not improve, please call us.  5. Wound Care a. Remove your bandages the day after surgery.  Unless  discharge instructions indicate otherwise, leave your bandage dry and in place overnight.  Remove the bandage during your first bowel movement.   b. Allow the wound packing to fall out over the next few days.  You can trim exposed gauze / ribbon as it falls out.  You do not need to repack the wound unless instructed otherwise.  Wear an absorbent pad or soft cotton gauze in your underwear as needed to catch any drainage and help keep the area  c. Keep the area clean and dry.  Bathe / shower every day.  Keep the area clean by showering / bathing over the incision / wound.   It is okay to soak an open wound to help wash it.  Wet wipes or showers / gentle washing after bowel movements is often less traumatic than regular toilet paper. d. You may have some styrofoam-like soft packing in the rectum which will come out with the first bowel movement.  e. You will often notice bleeding with bowel movements.  This should slow down by the end of the first week of surgery f. Expect some drainage.  This should slow down, too, by the end of the first week of surgery.  Wear an absorbent pad or soft cotton gauze in your underwear until the drainage stops. 6. ACTIVITIES as tolerated:   a. You may resume regular (light) daily activities beginning the next day-such as daily self-care, walking, climbing stairs-gradually increasing activities as tolerated.  If you can walk 30 minutes without difficulty, it is safe to try more intense activity such as jogging, treadmill, bicycling, low-impact aerobics, swimming, etc. b. Save the most intensive and strenuous activity for last such as sit-ups, heavy lifting, contact sports, etc  Refrain from any heavy lifting or straining until you are off narcotics for pain control.   c. DO NOT PUSH THROUGH PAIN.  Let pain be your guide: If it hurts to do something, don't do it.  Pain is your body warning you to avoid that activity for another week until the pain goes down. d. You may drive when  you are no longer taking prescription pain medication, you can comfortably sit for long periods of time, and you can safely maneuver your car and apply brakes. e. You may have sexual intercourse when it is comfortable.  7. FOLLOW UP in our office a. Please call [facility] at [phone] to set up an appointment to see your surgeon in the office for a follow-up appointment approximately 2 weeks after your surgery. b. Make sure that you call for this appointment the day you arrive home to insure a convenient appointment time. 10. IF YOU HAVE DISABILITY OR FAMILY LEAVE FORMS, BRING THEM TO THE OFFICE FOR PROCESSING.  DO NOT GIVE THEM TO YOUR DOCTOR.        WHEN TO CALL US [phone]: 1. Poor pain control 2. Reactions / problems with new medications (rash/itching, nausea, etc)    3. Fever over 101.5 F (38.5 C) 4. Inability to urinate 5. Nausea and/or vomiting 6. Worsening swelling or bruising 7. Continued bleeding from incision. 8. Increased pain, redness, or drainage from the incision  The clinic staff is available to answer your questions during regular business hours (8:30am-5pm).  Please dont hesitate to call and ask to speak to one of our nurses for clinical concerns.   A surgeon from [facility] is always on call at the hospitals   If you have a medical emergency, go to the nearest emergency room or call 911.    [facility], [address], [place], [place], [place]  [address] ? MAIN: [phone] ? TOLL FREE: [phone] ? FAX [phone] [email or web address]  Recommended surgeon followup physical exam schedule for condyloma/warts: -every 6 months until negative exam x2, then -every Year until negative exam x2, then -as needed thereafter   Genital Warts Genital warts are a sexually transmitted infection. They may appear as small bumps on the tissues of the genital area. CAUSES  Genital warts are caused by a virus  called human papillomavirus (HPV). HPV is the most common sexually transmitted disease (STD) and infection of the sex organs. This infection is spread by having unprotected sex with an infected person. It can be spread by vaginal, anal, and oral sex. Many people do not know they are infected. They may be infected for years without problems. However, even if they do not have problems, they can unknowingly pass the infection to their sexual partners. SYMPTOMS   Itching and irritation in the genital area.Anal wart   Warts that bleed.  Painful sexual intercourse. DIAGNOSIS  Warts are usually recognized with the naked eye on the vagina, vulva, perineum, anus, and rectum. Certain tests can also diagnose genital warts, such as:  A Pap test.  A tissue sample (biopsy) exam.  Colposcopy. A magnifying tool is used to examine the vagina and cervix. The HPV cells will change color when certain solutions are used. TREATMENT  Warts can be removed by:  Applying certain chemicals, such as cantharidin or podophyllin.  Liquid nitrogen freezing (cryotherapy).  Immunotherapy with candida or trichophyton injections.  Laser treatment.  Burning with an electrified probe (electrocautery).  Interferon injections.  Surgery. PREVENTION  HPV vaccination can help prevent HPV infections that cause genital warts and that cause cancer of the cervix. It is recommended that the vaccination be given to people between the ages 24 to 57 years old. The vaccine might not work as well or might not work at all if you already have HPV. It should not be given to pregnant women. HOME CARE INSTRUCTIONS   It is important to follow your caregiver's instructions. The warts will not go away without treatment. Repeat treatments are often needed to get rid of warts. Even after it appears that the warts are gone, the normal tissue underneath often remains infected.  Do not try to treat genital warts with medicine used to treat hand  warts. This type of medicine is strong and can burn the skin in the genital area, causing more damage.  Tell your past and current sexual partner(s) that you have genital warts. They may be infected also and need treatment.  Avoid sexual contact while being treated.  Do not touch or scratch the warts. The infection may spread to other parts of your body.  Women with genital warts should have a cervical cancer check (Pap test) at least once a year. This type  of cancer is slow-growing and can be cured if found early. Chances of developing cervical cancer are increased with HPV.  Inform your obstetrician about your warts in the event of pregnancy. This virus can be passed to the baby's respiratory tract. Discuss this with your caregiver.  Use a condom during sexual intercourse. Following treatment, the use of condoms will help prevent reinfection.  Ask your caregiver about using over-the-counter anti-itch creams. SEEK MEDICAL CARE IF:   Your treated skin becomes red, swollen, or painful.  You have a fever.  You feel generally ill.  You feel little lumps in and around your genital area.  You are bleeding or have painful sexual intercourse. MAKE SURE YOU:   Understand these instructions.  Will watch your condition.  Will get help right away if you are not doing well or get worse.  GETTING TO GOOD BOWEL HEALTH. Irregular bowel habits such as constipation and diarrhea can lead to many problems over time.  Having one soft bowel movement a day is the most important way to prevent further problems.  The anorectal canal is designed to handle stretching and feces to safely manage our ability to get rid of solid waste (feces, poop, stool) out of our body.  BUT, hard constipated stools can act like ripping concrete bricks and diarrhea can be a burning fire to this very sensitive area of our body, causing inflamed hemorrhoids, anal fissures, increasing risk is perirectal abscesses, abdominal  pain/bloating, an making irritable bowel worse.     The goal: ONE SOFT BOWEL MOVEMENT A DAY!  To have soft, regular bowel movements:    Drink at least 8 tall glasses of water a day.     Take plenty of fiber.  Fiber is the undigested part of plant food that passes into the colon, acting s natures broom to encourage bowel motility and movement.  Fiber can absorb and hold large amounts of water. This results in a larger, bulkier stool, which is soft and easier to pass. Work gradually over several weeks up to 6 servings a day of fiber (25g a day even more if needed) in the form of: o Vegetables -- Root (potatoes, carrots, turnips), leafy green (lettuce, salad greens, celery, spinach), or cooked high residue (cabbage, broccoli, etc) o Fruit -- Fresh (unpeeled skin & pulp), Dried (prunes, apricots, cherries, etc ),  or stewed ( applesauce)  o Whole grain breads, pasta, etc (whole wheat)  o Bran cereals    Bulking Agents -- This type of water-retaining fiber generally is easily obtained each day by one of the following:  o Psyllium bran -- The psyllium plant is remarkable because its ground seeds can retain so much water. This product is available as Metamucil, Konsyl, Effersyllium, Per Diem Fiber, or the less expensive generic preparation in drug and health food stores. Although labeled a laxative, it really is not a laxative.  o Methylcellulose -- This is another fiber derived from wood which also retains water. It is available as Citrucel. o Polyethylene Glycol - and artificial fiber commonly called Miralax or Glycolax.  It is helpful for people with gassy or bloated feelings with regular fiber o Flax Seed - a less gassy fiber than psyllium   No reading or other relaxing activity while on the toilet. If bowel movements take longer than 5 minutes, you are too constipated   AVOID CONSTIPATION.  High fiber and water intake usually takes care of this.  Sometimes a laxative is needed to stimulate more  frequent bowel movements, but    Laxatives are not a good long-term solution as it can wear the colon out. o Osmotics (Milk of Magnesia, Fleets phosphosoda, Magnesium citrate, MiraLax, GoLytely) are safer than  o Stimulants (Senokot, Castor Oil, Dulcolax, Ex Lax)    o Do not take laxatives for more than 7days in a row.    IF SEVERELY CONSTIPATED, try a Bowel Retraining Program: o Do not use laxatives.  o Eat a diet high in roughage, such as bran cereals and leafy vegetables.  o Drink six (6) ounces of prune or apricot juice each morning.  o Eat two (2) large servings of stewed fruit each day.  o Take one (1) heaping tablespoon of a psyllium-based bulking agent twice a day. Use sugar-free sweetener when possible to avoid excessive calories.  o Eat a normal breakfast.  o Set aside 15 minutes after breakfast to sit on the toilet, but do not strain to have a bowel movement.  o If you do not have a bowel movement by the third day, use an enema and repeat the above steps.    Controlling diarrhea o Switch to liquids and simpler foods for a few days to avoid stressing your intestines further. o Avoid dairy products (especially milk & ice cream) for a short time.  The intestines often can lose the ability to digest lactose when stressed. o Avoid foods that cause gassiness or bloating.  Typical foods include beans and other legumes, cabbage, broccoli, and dairy foods.  Every person has some sensitivity to other foods, so listen to our body and avoid those foods that trigger problems for you. o Adding fiber (Citrucel, Metamucil, psyllium, Miralax) gradually can help thicken stools by absorbing excess fluid and retrain the intestines to act more normally.  Slowly increase the dose over a few weeks.  Too much fiber too soon can backfire and cause cramping & bloating. o Probiotics (such as active yogurt, Align, etc) may help repopulate the intestines and colon with normal bacteria and calm down a sensitive  digestive tract.  Most studies show it to be of mild help, though, and such products can be costly. o Medicines:   Bismuth subsalicylate (ex. Kayopectate, Pepto Bismol) every 30 minutes for up to 6 doses can help control diarrhea.  Avoid if pregnant.   Loperamide (Immodium) can slow down diarrhea.  Start with two tablets (4mg  total) first and then try one tablet every 6 hours.  Avoid if you are having fevers or severe pain.  If you are not better or start feeling worse, stop all medicines and call your doctor for advice o Call your doctor if you are getting worse or not better.  Sometimes further testing (cultures, endoscopy, X-ray studies, bloodwork, etc) may be needed to help diagnose and treat the cause of the diarrhea.   Post Anesthesia Home Care Instructions  Activity: Get plenty of rest for the remainder of the day. A responsible adult should stay with you for 24 hours following the procedure.  For the next 24 hours, DO NOT: -Drive a car -Paediatric nurse -Drink alcoholic beverages -Take any medication unless instructed by your physician -Make any legal decisions or sign important papers.  Meals: Start with liquid foods such as gelatin or soup. Progress to regular foods as tolerated. Avoid greasy, spicy, heavy foods. If nausea and/or vomiting occur, drink only clear liquids until the nausea and/or vomiting subsides. Call your physician if vomiting continues.  Special Instructions/Symptoms: Your throat may feel dry or  sore from the anesthesia or the breathing tube placed in your throat during surgery. If this causes discomfort, gargle with warm salt water. The discomfort should disappear within 24 hours.   Information for Discharge Teaching: EXPAREL (bupivacaine liposome injectable suspension)   Your surgeon gave you EXPAREL(bupivacaine) in your surgical incision to help control your pain after surgery.   EXPAREL is a local anesthetic that provides pain relief by numbing the  tissue around the surgical site.  EXPAREL is designed to release pain medication over time and can control pain for up to 72 hours.  Depending on how you respond to EXPAREL, you may require less pain medication during your recovery.  Possible side effects:  Temporary loss of sensation or ability to move in the area where bupivacaine was injected.  Nausea, vomiting, constipation  Rarely, numbness and tingling in your mouth or lips, lightheadedness, or anxiety may occur.  Call your doctor right away if you think you may be experiencing any of these sensations, or if you have other questions regarding possible side effects.  Follow all other discharge instructions given to you by your surgeon or nurse. Eat a healthy diet and drink plenty of water or other fluids.  If you return to the hospital for any reason within 96 hours following the administration of EXPAREL, please inform your health care providers.

## 2014-03-20 NOTE — Anesthesia Preprocedure Evaluation (Addendum)
Anesthesia Evaluation  Patient identified by MRN, date of birth, ID band Patient awake    Reviewed: Allergy & Precautions, H&P , NPO status , Patient's Chart, lab work & pertinent test results  History of Anesthesia Complications Negative for: history of anesthetic complications  Airway Mallampati: I TM Distance: >3 FB Neck ROM: Full    Dental  (+) Edentulous Upper   Pulmonary asthma , Current Smoker,  1PPD smoker breath sounds clear to auscultation        Cardiovascular hypertension, + Peripheral Vascular Disease Rhythm:Regular Rate:Normal     Neuro/Psych negative neurological ROS  negative psych ROS   GI/Hepatic Neg liver ROS, GERD-  ,  Endo/Other  diabetes, Poorly Controlled, Type 1, Insulin Dependent  Renal/GU negative Renal ROS  negative genitourinary   Musculoskeletal negative musculoskeletal ROS (+)   Abdominal   Peds  Hematology negative hematology ROS (+)   Anesthesia Other Findings   Reproductive/Obstetrics negative OB ROS                          Anesthesia Physical Anesthesia Plan  ASA: III  Anesthesia Plan: General   Post-op Pain Management:    Induction: Intravenous  Airway Management Planned: LMA  Additional Equipment: None  Intra-op Plan:   Post-operative Plan: Extubation in OR  Informed Consent: I have reviewed the patients History and Physical, chart, labs and discussed the procedure including the risks, benefits and alternatives for the proposed anesthesia with the patient or authorized representative who has indicated his/her understanding and acceptance.   Dental advisory given  Plan Discussed with: Surgeon, Anesthesiologist and CRNA  Anesthesia Plan Comments:        Anesthesia Quick Evaluation

## 2014-03-21 ENCOUNTER — Telehealth (INDEPENDENT_AMBULATORY_CARE_PROVIDER_SITE_OTHER): Payer: Self-pay

## 2014-03-21 ENCOUNTER — Encounter (HOSPITAL_BASED_OUTPATIENT_CLINIC_OR_DEPARTMENT_OTHER): Payer: Self-pay | Admitting: Surgery

## 2014-03-21 NOTE — Telephone Encounter (Signed)
Patient wife asking if he can stop metamucil ,patient has increased diarrhea . He is needing to start his losartan , but does not know when he should restart . please advise

## 2014-03-21 NOTE — Telephone Encounter (Signed)
The patient may adjust fiber bowel regimen until he is having one soft bowel movement a day.  Pack if he is having diarrhea or stop altogether.  He can start all his home medications as before surgery.

## 2014-03-21 NOTE — Telephone Encounter (Signed)
LMOM for pt advising him that he could back off the fiber bowel regimen as long as he is having one soft bowel movement a day per Dr Johney Maine. If the pt is having diarrhea b/c of the Metamucil then he needs to stop all together or decrease his dosage per Dr Johney Maine. We do not want the pt to become constipated. Pt may resume all medications he was taking before surgery per Dr Johney Maine. If any questions please call back.

## 2014-03-21 NOTE — Telephone Encounter (Signed)
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 Johney Maine.  He has had diarrhea since surgery.  He has a hx of this problem, but his wife states it is worse after surgery.  Dr. Deatra Ina had recommended Metamucil for him in the past, and she would like to start giving it to him today.  Also would like to know if okay to restart his Losartan.  His BP is high.  I told Ms. Oakland I thought the Metamucil would be fine, but I would check with Dr. Johney Maine regarding the Losartan.

## 2014-03-24 ENCOUNTER — Other Ambulatory Visit: Payer: Self-pay | Admitting: Endocrinology

## 2014-04-03 ENCOUNTER — Encounter (INDEPENDENT_AMBULATORY_CARE_PROVIDER_SITE_OTHER): Payer: Self-pay | Admitting: Surgery

## 2014-04-03 ENCOUNTER — Encounter: Payer: Self-pay | Admitting: Endocrinology

## 2014-04-03 ENCOUNTER — Ambulatory Visit (INDEPENDENT_AMBULATORY_CARE_PROVIDER_SITE_OTHER): Payer: Medicare Other | Admitting: Endocrinology

## 2014-04-03 ENCOUNTER — Ambulatory Visit (INDEPENDENT_AMBULATORY_CARE_PROVIDER_SITE_OTHER): Payer: Medicare Other | Admitting: Surgery

## 2014-04-03 VITALS — BP 118/58 | HR 78 | Temp 97.9°F | Ht 71.0 in | Wt 196.0 lb

## 2014-04-03 VITALS — BP 130/68 | HR 66 | Temp 98.2°F | Ht 71.0 in | Wt 195.1 lb

## 2014-04-03 DIAGNOSIS — L738 Other specified follicular disorders: Secondary | ICD-10-CM

## 2014-04-03 DIAGNOSIS — M25561 Pain in right knee: Secondary | ICD-10-CM

## 2014-04-03 DIAGNOSIS — A630 Anogenital (venereal) warts: Secondary | ICD-10-CM

## 2014-04-03 DIAGNOSIS — N4889 Other specified disorders of penis: Secondary | ICD-10-CM

## 2014-04-03 DIAGNOSIS — E109 Type 1 diabetes mellitus without complications: Secondary | ICD-10-CM

## 2014-04-03 DIAGNOSIS — M25569 Pain in unspecified knee: Secondary | ICD-10-CM | POA: Insufficient documentation

## 2014-04-03 DIAGNOSIS — L739 Follicular disorder, unspecified: Secondary | ICD-10-CM | POA: Insufficient documentation

## 2014-04-03 DIAGNOSIS — L678 Other hair color and hair shaft abnormalities: Secondary | ICD-10-CM

## 2014-04-03 MED ORDER — DOXYCYCLINE HYCLATE 100 MG PO CAPS: 100.0000 mg | ORAL_CAPSULE | Freq: Two times a day (BID) | ORAL | Status: DC

## 2014-04-03 MED ORDER — HYDROCODONE-ACETAMINOPHEN 10-325 MG PO TABS: 1.0000 | ORAL_TABLET | ORAL | Status: DC | PRN

## 2014-04-03 MED ORDER — LOVASTATIN 20 MG PO TABS: ORAL_TABLET | ORAL | Status: DC

## 2014-04-03 NOTE — Progress Notes (Signed)
Subjective:    Patient ID: [name], male    DOB: 03/10/1956, 58 y.o.   MRN: [id number]  HPI Pt returns for f/u of insulin-requiring DM (dx'ed 0539; no known complications; he has been on a simple regimen, after poor results with multiple daily injections; he has never had severe hypoglycemia or DKA; he has requested inexpensive human insulin).  no cbg record, but states cbg's vary from 165-300's.  There is no trend throughout the day.  He has slight numbness of the feet, and assoc swelling.  He also has pain at the right leg with exertion.  He says he cannot afford pravachol. Past Medical History  Diagnosis Date  . Diabetic neuropathy   . Smokers' cough   . Productive cough   . GERD (gastroesophageal reflux disease)   . Hyperlipidemia   . Allergic rhinitis   . History of bladder cancer     s/p  turbt  2013/   transitional cell carcinoma--   . Hypertension   . Lower urinary tract symptoms (LUTS)   . Type 2 diabetes mellitus with insulin therapy     monitor by  dr ellsion  . Claudication of calf muscles   . History of gout   . Wears dentures     upper  . At risk for sleep apnea     STOP-BANG= 5   SENT TO PCP 03-14-2014  . Hidradenitis suppurativa     h/p massive perineal involvement in past    Past Surgical History  Procedure Laterality Date  . Laser ablation of penile and perianal warts  07-29-2007  Dr. Johney Maine  . Left shoulder surgery  2003  . Cataract extraction w/ intraocular lens implant Right   . Transurethral resection of bladder tumor  05/21/2012    Procedure: TRANSURETHRAL RESECTION OF BLADDER TUMOR (TURBT);  Surgeon: Claybon Jabs, MD;  Location: [facility];  Service: Urology;  Laterality: N/A;     . Inguinal hidradenitis excision  1998, 1999  . Perineal hidradenitis excision  1998, 1999  . Axillary hidradenitis excision  1997  . Laser ablation condolamata N/A 03/20/2014    Procedure: EXAM UNDER ANESTHESIA, REMOVAL/ABLATION OF CONDYLOMATA  PENIS,GROINS, ANUS, ANAL CANAL;  Surgeon: [name], MD;  Location: [place] [facility];  Service: General;  Laterality: N/A;  groin and anus  . Co2 laser application N/A 7/67/3419    Procedure: CO2 LASER APPLICATION,PENIS, GROIN, ANUS;  Surgeon: [name], MD;  Location: [place];  Service: General;  Laterality: N/A;    History   Social History  . Marital Status: Married    Spouse Name: N/A    Number of Children: N/A  . Years of Education: N/A   Occupational History  . Not on file.   Social History Main Topics  . Smoking status: Current Every Day Smoker -- 1.00 packs/day for 40 years    Types: Cigarettes  . Smokeless tobacco: Never Used  . Alcohol Use: No  . Drug Use: No  . Sexual Activity: Not on file   Other Topics Concern  . Not on file   Social History Narrative  . No narrative on file    Current Outpatient Prescriptions on File Prior to Visit  Medication Sig Dispense Refill  . albuterol (PROVENTIL HFA;VENTOLIN HFA) 108 (90 BASE) MCG/ACT inhaler Inhale 2 puffs into the lungs every 6 (six) hours as needed for wheezing.  1 Inhaler  2  . doxycycline (VIBRAMYCIN) 100 MG capsule Take  1 capsule (100 mg total) by mouth 2 (two) times daily.  20 capsule  2  . insulin NPH-regular Human (NOVOLIN 70/30 RELION) (70-30) 100 UNIT/ML injection INJECT 30 UNITS SUBCUTANEOUSLY WITH BREAKFAST AND 25 UNITS BEFORE EVENing meal      . losartan-hydrochlorothiazide (HYZAAR) 50-12.5 MG per tablet TAKE ONE TABLET BY MOUTH ONCE DAILY  30 tablet  2  . calcium carbonate (TUMS - DOSED IN MG ELEMENTAL CALCIUM) 500 MG chewable tablet Chew 1 tablet by mouth as needed.       . desloratadine (CLARINEX) 5 MG tablet Take 5 mg by mouth as needed.        No current facility-administered medications on file prior to visit.    No Known Allergies  Family History  Problem Relation Age of Onset  . Colon cancer Neg Hx   . Esophageal cancer Neg Hx   . Pancreatic cancer Neg  Hx   . Prostate cancer Neg Hx   . Kidney disease Neg Hx   . Liver disease Neg Hx   . Lung cancer Neg Hx   . Diabetes Maternal Aunt     x 2  . Hypertension Mother   . Cancer Father     lung ca    BP 118/58  Pulse 78  Temp(Src) 97.9 F (36.6 C) (Oral)  Ht 5\' 11"  (1.803 m)  Wt 196 lb (88.905 kg)  BMI 27.35 kg/m2  SpO2 95%    Review of Systems He denies hypoglycemia.  He has gained a few lbs.     Objective:   Physical Exam VITAL SIGNS:  See vs page GENERAL: no distress Pulses: dorsalis pedis intact bilat.   Feet: no deformity. normal color and temp.  Trace bilat leg edema.  There is bilateral onychomycosis.   Skin:  no ulcer on the feet.   Neuro: sensation is intact to touch on the feet, but decreased from normal.  Lab Results  Component Value Date   HGBA1C 7.4* 12/12/2013      Assessment & Plan:  DM: moderate exacerbation Dyslipidemia: he requests a cheaper med LE pain, new, uncertain etiology Weight gain: this complicates the rx of DM: he is advised to re-lose.    Patient is advised the following: Patient Instructions  check your blood sugar twice a day.  vary the time of day when you check, between before the 3 meals, and at bedtime.  also check if you have symptoms of your blood sugar being too high or too low.  please keep a record of the readings and bring it to your next appointment here.  You can write it on any piece of paper.  please call us sooner if your blood sugar goes below 70, or if you have a lot of readings over 200. Please come back for a regular physical appointment in 3 months.    blood tests are being requested for you today.  We'll contact you with results.   On this type of insulin schedule, you should eat meals on a regular schedule.  If a meal is missed or significantly delayed, your blood sugar could go low.  i have sent a prescription to your pharmacy, to change the cholesterol pill, to a cheaper one at [place].  Please increase the insulin,  to the numbers noted on this page.    Please send [place] the results of next week's blood tests.

## 2014-04-03 NOTE — Progress Notes (Signed)
Subjective:     Patient ID: [name], male   DOB: 05-06-1956, 58 y.o.   MRN: [id number]  HPI  Note: Portions of this report may have been transcribed using voice recognition software. Every effort was made to ensure accuracy; however, inadvertent computerized transcription errors may be present.   Any transcriptional errors that result from this process are unintentional.       [name]  09/01/1955 [id number]  Patient Care Team: Paul Shin, MD as PCP - General Paul Castle, MD as Consulting Physician (Gastroenterology) Paul Jabs, MD as Consulting Physician (Urology)  Procedure (Date: 03/20/2014):  POST-OPERATIVE DIAGNOSIS: condolamata acuminatum of penis, groins, perianal region  PROCEDURE: Procedure(s):  EXAM UNDER ANESTHESIA,  REMOVAL/ABLATION OF CONDYLOMATA PENIS,GROINS, ANUS, ANAL CANAL  SURGEON: Surgeon(s):  Paul Hector, MD  Paul Jabs, MD  ANESTHESIA: local and general  Diagnosis 1. Condyloma, left groin - CONDYLOMA ACUMINATUM 2. Condyloma - CONDYLOMA ACUMINATUM Paul Laws MD Pathologist, Electronic Signature  This patient returns for surgical re-evaluation.  He feels like he is doing okay.  Some yellow drainage.  Controlled with pads.  No constipation.  No fevers or chills.  Energy level locate.  He wonders about his HIV status.  I thought it had this checked before but now he does not think he has.  Patient Active Problem List   Diagnosis Date Noted  . Edema of posterior shaft of penis 01/31/2014  . Bladder cancer 05/21/2012  . Coraline Talwar hematuria 03/25/2012  . Encounter for long-term (current) use of other medications 02/02/2012  . Screening for prostate cancer 02/02/2012  . Hepatomegaly 09/10/2010  . ABNORMAL ELECTROCARDIOGRAM 09/10/2010  . FOOT PAIN, LEFT 05/02/2010  . ASTHMA 07/25/2009  . Tobacco abuse 05/22/2009  . Cough 05/11/2008  . Condylomata of penis, groins, perianal - recurrent 01/17/2008  . SHOULDER PAIN, LEFT, CHRONIC  01/17/2008  . HYPERCHOLESTEROLEMIA 10/06/2007  . DIABETES MELLITUS, TYPE I 03/06/2007  . HYPERTENSION 03/06/2007  . ALLERGIC RHINITIS 03/06/2007    Past Medical History  Diagnosis Date  . Diabetic neuropathy   . Smokers' cough   . Productive cough   . GERD (gastroesophageal reflux disease)   . Hyperlipidemia   . Allergic rhinitis   . History of bladder cancer     s/p  turbt  2013/   transitional cell carcinoma--   . Hypertension   . Lower urinary tract symptoms (LUTS)   . Type 2 diabetes mellitus with insulin therapy     monitor by  dr ellsion  . Claudication of calf muscles   . History of gout   . Wears dentures     upper  . At risk for sleep apnea     STOP-BANG= 5   SENT TO PCP 03-14-2014    Past Surgical History  Procedure Laterality Date  . Laser ablation of penile and perianal warts  07-29-2007  Dr. Johney Maine  . Left shoulder surgery  2003  . Cataract extraction w/ intraocular lens implant Right   . Transurethral resection of bladder tumor  05/21/2012    Procedure: TRANSURETHRAL RESECTION OF BLADDER TUMOR (TURBT);  Surgeon: Paul Jabs, MD;  Location: [facility];  Service: Urology;  Laterality: N/A;     . Inguinal hidradenitis excision  1998, 1999  . Perineal hidradenitis excision  1998, 1999  . Axillary hidradenitis excision  1997  . Laser ablation condolamata N/A 03/20/2014    Procedure: EXAM UNDER ANESTHESIA, REMOVAL/ABLATION OF CONDYLOMATA PENIS,GROINS, ANUS, ANAL CANAL;  Surgeon:  Paul Hector, MD;  Location: [facility];  Service: General;  Laterality: N/A;  groin and anus  . Co2 laser application N/A 7/82/9562    Procedure: CO2 LASER APPLICATION,PENIS, GROIN, ANUS;  Surgeon: Paul Hector, MD;  Location: [place];  Service: General;  Laterality: N/A;    History   Social History  . Marital Status: Married    Spouse Name: N/A    Number of Children: N/A  . Years of Education: N/A   Occupational History    . Not on file.   Social History Main Topics  . Smoking status: Current Every Day Smoker -- 1.00 packs/day for 40 years    Types: Cigarettes  . Smokeless tobacco: Never Used  . Alcohol Use: No  . Drug Use: No  . Sexual Activity: Not on file   Other Topics Concern  . Not on file   Social History Narrative  . No narrative on file    Family History  Problem Relation Age of Onset  . Colon cancer Neg Hx   . Esophageal cancer Neg Hx   . Pancreatic cancer Neg Hx   . Prostate cancer Neg Hx   . Kidney disease Neg Hx   . Liver disease Neg Hx   . Lung cancer Neg Hx   . Diabetes Maternal Aunt     x 2  . Hypertension Mother   . Cancer Father     lung ca    Current Outpatient Prescriptions  Medication Sig Dispense Refill  . albuterol (PROVENTIL HFA;VENTOLIN HFA) 108 (90 BASE) MCG/ACT inhaler Inhale 2 puffs into the lungs every 6 (six) hours as needed for wheezing.  1 Inhaler  2  . calcium carbonate (TUMS - DOSED IN MG ELEMENTAL CALCIUM) 500 MG chewable tablet Chew 1 tablet by mouth as needed.       Marland Kitchen HYDROcodone-acetaminophen (NORCO) 10-325 MG per tablet Take 1 tablet by mouth every 4 (four) hours as needed for moderate pain or severe pain.  50 tablet  0  . insulin NPH-regular Human (NOVOLIN 70/30 RELION) (70-30) 100 UNIT/ML injection INJECT 20 UNITS SUBCUTANEOUSLY WITH BREAKFAST AND 18 UNITS BEFORE EVENING MEALS      . losartan-hydrochlorothiazide (HYZAAR) 50-12.5 MG per tablet TAKE ONE TABLET BY MOUTH ONCE DAILY  30 tablet  2  . pravastatin (PRAVACHOL) 40 MG tablet Take 40 mg by mouth 2 (two) times daily. TAKE TWO TABLETS BY MOUTH EVERY DAY.      Marland Kitchen desloratadine (CLARINEX) 5 MG tablet Take 5 mg by mouth as needed.        No current facility-administered medications for this visit.     No Known Allergies  BP 130/68  Pulse 66  Temp(Src) 98.2 F (36.8 C) (Oral)  Ht 5\' 11"  (1.803 m)  Wt 195 lb 2 oz (88.508 kg)  BMI 27.23 kg/m2  No results found.   Review of Systems   Constitutional: Negative for fever, chills and diaphoresis.  HENT: Negative for sore throat and trouble swallowing.   Eyes: Negative for photophobia and visual disturbance.  Respiratory: Negative for choking and shortness of breath.   Cardiovascular: Negative for chest pain and palpitations.  Gastrointestinal: Negative for nausea, vomiting, abdominal distention, anal bleeding and rectal pain.  Genitourinary: Negative for dysuria, urgency, difficulty urinating and testicular pain.  Musculoskeletal: Negative for arthralgias, gait problem, myalgias and neck pain.  Skin: Negative for color change and rash.  Neurological: Negative for dizziness, speech difficulty, weakness and numbness.  Hematological:  Negative for adenopathy.  Psychiatric/Behavioral: Negative for hallucinations, confusion and agitation.       Objective:   Physical Exam  Constitutional: He is oriented to person, place, and time. He appears well-developed and well-nourished. No distress.  HENT:  Head: Normocephalic.  Mouth/Throat: Oropharynx is clear and moist. No oropharyngeal exudate.  Eyes: Conjunctivae and EOM are normal. Pupils are equal, round, and reactive to light. No scleral icterus.  Neck: Normal range of motion. No tracheal deviation present.  Cardiovascular: Normal rate, normal heart sounds and intact distal pulses.   Pulmonary/Chest: Effort normal. No respiratory distress.  Abdominal: Soft. He exhibits no distension. There is no tenderness. Hernia confirmed negative in the right inguinal area and confirmed negative in the left inguinal area.  Incisions clean with normal healing ridges.  No hernias  Genitourinary:        Musculoskeletal: Normal range of motion. He exhibits no tenderness.  Neurological: He is alert and oriented to person, place, and time. No cranial nerve deficit. He exhibits normal muscle tone. Coordination normal.  Skin: Skin is warm and dry. No rash noted. He is not diaphoretic.   Psychiatric: He has a normal mood and affect. His behavior is normal.       Assessment:     Recovering status post laser ablation and excision of moderate condylomata for perianal/perineal/genital regions.  Moderate folliculitis of left posterior scrotum in patient with history of hidradenitis suppurativa     Plan:     Increase activity as tolerated to regular activity.  Low impact exercise such as walking an hour a day at least ideal.  Do not push through pain.  Check HIV serum level.  That had been done already but he does not think so.  Diet as tolerated.  Low fat high fiber diet ideal.  Bowel regimen with 30 g fiber a day and fiber supplement as needed to avoid problems.  Return to clinic q1 month until healed, sooner as needed.   Instructions discussed.  Followup with primary care physician for other health issues as would normally be done.  Consider screening for malignancies (breast, prostate, colon, melanoma, etc) as appropriate.  Questions answered.  The patient expressed understanding and appreciation

## 2014-04-03 NOTE — Patient Instructions (Addendum)
check your blood sugar twice a day.  vary the time of day when you check, between before the 3 meals, and at bedtime.  also check if you have symptoms of your blood sugar being too high or too low.  please keep a record of the readings and bring it to your next appointment here.  You can write it on any piece of paper.  please call us sooner if your blood sugar goes below 70, or if you have a lot of readings over 200. Please come back for a regular physical appointment in 3 months.    blood tests are being requested for you today.  We'll contact you with results.   On this type of insulin schedule, you should eat meals on a regular schedule.  If a meal is missed or significantly delayed, your blood sugar could go low.  i have sent a prescription to your pharmacy, to change the cholesterol pill, to a cheaper one at [place].  Please increase the insulin, to the numbers noted on this page.    Please send [place] the results of next week's blood tests.

## 2014-04-03 NOTE — Patient Instructions (Signed)
Please consider the recommendations that we have given you today:  Take doxycycline antibiotic for folliculitis of the scrotum.  Continue warm soaks/bandages for perianal wound.  Patient continued to heal over time.  See the Handout(s) we have given you.  Please call our office at [phone] if you wish to schedule surgery or if you have further questions / concerns.   ANORECTAL SURGERY: POST OP INSTRUCTIONS  1. Take your usually prescribed home medications unless otherwise directed. 2. DIET: Follow a light bland diet the first 24 hours after arrival home, such as soup, liquids, crackers, etc.  Be sure to include lots of fluids daily.  Avoid fast food or heavy meals as your are more likely to get nauseated.  Eat a low fat the next few days after surgery.   3. PAIN CONTROL: a. Pain is best controlled by a usual combination of three different methods TOGETHER: i. Ice/Heat ii. Over the counter pain medication iii. Prescription pain medication b. Most patients will experience some swelling and discomfort in the anus/rectal area. and incisions.  Ice packs or heat (30-60 minutes up to 6 times a day) will help. Use ice for the first few days to help decrease swelling and bruising, then switch to heat such as warm towels, sitz baths, warm baths, etc to help relax tight/sore spots and speed recovery.  Some people prefer to use ice alone, heat alone, alternating between ice & heat.  Experiment to what works for you.  Swelling and bruising can take several weeks to resolve.   c. It is helpful to take an over-the-counter pain medication regularly for the first few weeks.  Choose one of the following that works best for you: i. Naproxen (Aleve, etc)  Two 220mg  tabs twice a day ii. Ibuprofen (Advil, etc) Three 200mg  tabs four times a day (every meal & bedtime) iii. Acetaminophen (Tylenol, etc) 500-650mg  four times a day (every meal & bedtime) d. A  prescription for pain medication (such as oxycodone,  hydrocodone, etc) should be given to you upon discharge.  Take your pain medication as prescribed.  i. If you are having problems/concerns with the prescription medicine (does not control pain, nausea, vomiting, rash, itching, etc), please call us [phone] to see if we need to switch you to a different pain medicine that will work better for you and/or control your side effect better. ii. If you need a refill on your pain medication, please contact your pharmacy.  They will contact our office to request authorization. Prescriptions will not be filled after 5 pm or on week-ends.  Use a Sitz Bath 4-8 times a day for relief A sitz bath is a warm water bath taken in the sitting position that covers only the hips and buttocks. It may be used for either healing or hygiene purposes. Sitz baths are also used to relieve pain, itching, or muscle spasms. The water may contain medicine. Moist heat will help you heal and relax.  HOME CARE INSTRUCTIONS  Take 3 to 4 sitz baths a day. 1. Fill the bathtub half full with warm water. 2. Sit in the water and open the drain a little. 3. Turn on the warm water to keep the tub half full. Keep the water running constantly. 4. Soak in the water for 15 to 20 minutes. 5. After the sitz bath, pat the affected area dry first. SEEK MEDICAL CARE IF:  You get worse instead of better. Stop the sitz baths if you get worse.  4. KEEP YOUR BOWELS REGULAR a. The goal is one bowel movement a day b. Avoid getting constipated.  Between the surgery and the pain medications, it is common to experience some constipation.  Increasing fluid intake and taking a fiber supplement (such as Metamucil, Citrucel, FiberCon, MiraLax, etc) 1-2 times a day regularly will usually help prevent this problem from occurring.  A mild laxative (prune juice, Milk of Magnesia, MiraLax, etc) should be taken according to package directions if there are no bowel movements after 48 hours. c. Watch out for  diarrhea.  If you have many loose bowel movements, simplify your diet to bland foods & liquids for a few days.  Stop any stool softeners and decrease your fiber supplement.  Switching to mild anti-diarrheal medications (Kayopectate, Pepto Bismol) can help.  If this worsens or does not improve, please call us.  5. Wound Care a. Remove your bandages the day after surgery.  Unless discharge instructions indicate otherwise, leave your bandage dry and in place overnight.  Remove the bandage during your first bowel movement.   b. Allow the wound packing to fall out over the next few days.  You can trim exposed gauze / ribbon as it falls out.  You do not need to repack the wound unless instructed otherwise.  Wear an absorbent pad or soft cotton gauze in your underwear as needed to catch any drainage and help keep the area  c. Keep the area clean and dry.  Bathe / shower every day.  Keep the area clean by showering / bathing over the incision / wound.   It is okay to soak an open wound to help wash it.  Wet wipes or showers / gentle washing after bowel movements is often less traumatic than regular toilet paper. d. Dennis Bast may have some styrofoam-like soft packing in the rectum which will come out with the first bowel movement.  e. You will often notice bleeding with bowel movements.  This should slow down by the end of the first week of surgery f. Expect some drainage.  This should slow down, too, by the end of the first week of surgery.  Wear an absorbent pad or soft cotton gauze in your underwear until the drainage stops. 6. ACTIVITIES as tolerated:   a. You may resume regular (light) daily activities beginning the next day-such as daily self-care, walking, climbing stairs-gradually increasing activities as tolerated.  If you can walk 30 minutes without difficulty, it is safe to try more intense activity such as jogging, treadmill, bicycling, low-impact aerobics, swimming, etc. b. Save the most intensive and  strenuous activity for last such as sit-ups, heavy lifting, contact sports, etc  Refrain from any heavy lifting or straining until you are off narcotics for pain control.   c. DO NOT PUSH THROUGH PAIN.  Let pain be your guide: If it hurts to do something, don't do it.  Pain is your body warning you to avoid that activity for another week until the pain goes down. d. You may drive when you are no longer taking prescription pain medication, you can comfortably sit for long periods of time, and you can safely maneuver your car and apply brakes. e. Dennis Bast may have sexual intercourse when it is comfortable.  7. FOLLOW UP in our office a. Please call [facility] at [phone] to set up an appointment to see your surgeon in the office for a follow-up appointment approximately 2 weeks after your surgery. b. Make sure that you call for  this appointment the day you arrive home to insure a convenient appointment time. 10. IF YOU HAVE DISABILITY OR FAMILY LEAVE FORMS, BRING THEM TO THE OFFICE FOR PROCESSING.  DO NOT GIVE THEM TO YOUR DOCTOR.        WHEN TO CALL us [phone]: 1. Poor pain control 2. Reactions / problems with new medications (rash/itching, nausea, etc)  3. Fever over 101.5 F (38.5 C) 4. Inability to urinate 5. Nausea and/or vomiting 6. Worsening swelling or bruising 7. Continued bleeding from incision. 8. Increased pain, redness, or drainage from the incision  The clinic staff is available to answer your questions during regular business hours (8:30am-5pm).  Please don't hesitate to call and ask to speak to one of our nurses for clinical concerns.   A surgeon from [facility] is always on call at the hospitals   If you have a medical emergency, go to the nearest emergency room or call 911.    [facility], [place], [place], [place], [place]  [address] ? MAIN: [phone] ? TOLL FREE: [phone] ? FAX [phone]  [phone] [email or web address]   Recommended surgeon followup physical exam schedule for condyloma/warts: -every 6 months until negative exam x2, then -every Year until negative exam x2, then -as needed thereafter   Genital Warts Genital warts are a sexually transmitted infection. They may appear as small bumps on the tissues of the genital area. CAUSES  Genital warts are caused by a virus called human papillomavirus (HPV). HPV is the most common sexually transmitted disease (STD) and infection of the sex organs. This infection is spread by having unprotected sex with an infected person. It can be spread by vaginal, anal, and oral sex. Many people do not know they are infected. They may be infected for years without problems. However, even if they do not have problems, they can unknowingly pass the infection to their sexual partners. SYMPTOMS   Itching and irritation in the genital area.Anal wart   Warts that bleed.  Painful sexual intercourse. DIAGNOSIS  Warts are usually recognized with the naked eye on the vagina, vulva, perineum, anus, and rectum. Certain tests can also diagnose genital warts, such as:  A Pap test.  A tissue sample (biopsy) exam.  Colposcopy. A magnifying tool is used to examine the vagina and cervix. The HPV cells will change color when certain solutions are used. TREATMENT  Warts can be removed by:  Applying certain chemicals, such as cantharidin or podophyllin.  Liquid nitrogen freezing (cryotherapy).  Immunotherapy with candida or trichophyton injections.  Laser treatment.  Burning with an electrified probe (electrocautery).  Interferon injections.  Surgery. PREVENTION  HPV vaccination can help prevent HPV infections that cause genital warts and that cause cancer of the cervix. It is recommended that the vaccination be given to people between the ages 73 to 13 years old. The vaccine might not work as well or might not work at all if you  already have HPV. It should not be given to pregnant women. HOME CARE INSTRUCTIONS   It is important to follow your caregiver's instructions. The warts will not go away without treatment. Repeat treatments are often needed to get rid of warts. Even after it appears that the warts are gone, the normal tissue underneath often remains infected.  Do not try to treat genital warts with medicine used to treat hand warts. This type of medicine is strong and can burn the skin in the genital area, causing more damage.  Tell your past and current sexual partner(s) that you have genital warts. They may be infected also and need treatment.  Avoid sexual contact while being treated.  Do not touch or scratch the warts. The infection may spread to other parts of your body.  Women with genital warts should have a cervical cancer check (Pap test) at least once a year. This type of cancer is slow-growing and can be cured if found early. Chances of developing cervical cancer are increased with HPV.  Inform your obstetrician about your warts in the event of pregnancy. This virus can be passed to the baby's respiratory tract. Discuss this with your caregiver.  Use a condom during sexual intercourse. Following treatment, the use of condoms will help prevent reinfection.  Ask your caregiver about using over-the-counter anti-itch creams. SEEK MEDICAL CARE IF:   Your treated skin becomes red, swollen, or painful.  You have a fever.  You feel generally ill.  You feel little lumps in and around your genital area.  You are bleeding or have painful sexual intercourse. MAKE SURE YOU:   Understand these instructions.  Will watch your condition.  Will get help right away if you are not doing well or get worse.   Hidradenitis Suppurativa, Sweat Gland Abscess Hidradenitis suppurativa is a long lasting (chronic), uncommon disease of the sweat glands. With this, boil-like lumps and scarring develop in the  groin, some times under the arms (axillae), and under the breasts. It may also uncommonly occur behind the ears, in the crease of the buttocks, and around the genitals.  CAUSES  The cause is from a blocking of the sweat glands. They then become infected. It may cause drainage and odor. It is not contagious. So it cannot be given to someone else. It most often shows up in puberty (about 26 to 57 years of age). But it may happen much later. It is similar to acne which is a disease of the sweat glands. This condition is slightly more common in African-Americans and women. SYMPTOMS  Hidradenitis usually starts as one or more red, tender, swellings in the groin or under the arms (axilla). Over a period of hours to days the lesions get larger. They often open to the skin surface, draining clear to yellow-colored fluid. The infected area heals with scarring. DIAGNOSIS  Your caregiver makes this diagnosis by looking at you. Sometimes cultures (growing germs on plates in the lab) may be taken. This is to see what germ (bacterium) is causing the infection.  TREATMENT  Topical germ killing medicine applied to the skin (antibiotics) are the treatment of choice. Antibiotics taken by mouth (systemic) are sometimes needed when the condition is getting worse or is severe. Avoid tight-fitting clothing which traps moisture in. Dirt does not cause hidradenitis and it is not caused by poor hygiene. Involved areas should be cleaned daily using an antibacterial soap. Some patients find that the liquid form of Lever 2000, applied to the involved areas as a lotion after bathing, can help reduce the odor related to this condition. Sometimes surgery is needed to drain infected areas or remove scarred tissue. Removal of large amounts of tissue is used only in severe cases. Birth control pills may be helpful. Oral retinoids (vitamin A derivatives) for 6 to 12 months which are effective for acne may also help this  condition. Weight loss will improve but not cure hidradenitis. It is made worse by being overweight. But the condition is not caused by being overweight. This condition  is more common in people who have had acne. It may become worse under stress. There is no medical cure for hidradenitis. It can be controlled, but not cured. The condition usually continues for years with periods of getting worse and getting better (remission). Document Released: 03/11/2004 Document Revised: 10/20/2011 Document Reviewed: 10/28/2013 [facility] Patient Information 2015 [place], [place]. This information is not intended to replace advice given to you by your health care provider. Make sure you discuss any questions you have with your health care provider.  Folliculitis  Folliculitis is redness, soreness, and swelling (inflammation) of the hair follicles. This condition can occur anywhere on the body. People with weakened immune systems, diabetes, or obesity have a greater risk of getting folliculitis. CAUSES  Bacterial infection. This is the most common cause.  Fungal infection.  Viral infection.  Contact with certain chemicals, especially oils and tars. Long-term folliculitis can result from bacteria that live in the nostrils. The bacteria may trigger multiple outbreaks of folliculitis over time. SYMPTOMS Folliculitis most commonly occurs on the scalp, thighs, legs, back, buttocks, and areas where hair is shaved frequently. An early sign of folliculitis is a small, white or yellow, pus-filled, itchy lesion (pustule). These lesions appear on a red, inflamed follicle. They are usually less than 0.2 inches (5 mm) wide. When there is an infection of the follicle that goes deeper, it becomes a boil or furuncle. A group of closely packed boils creates a larger lesion (carbuncle). Carbuncles tend to occur in hairy, sweaty areas of the body. DIAGNOSIS  Your caregiver can usually tell what is wrong by doing a physical exam.  A sample may be taken from one of the lesions and tested in a lab. This can help determine what is causing your folliculitis. TREATMENT  Treatment may include:  Applying warm compresses to the affected areas.  Taking antibiotic medicines orally or applying them to the skin.  Draining the lesions if they contain a large amount of pus or fluid.  Laser hair removal for cases of long-lasting folliculitis. This helps to prevent regrowth of the hair. HOME CARE INSTRUCTIONS  Apply warm compresses to the affected areas as directed by your caregiver.  If antibiotics are prescribed, take them as directed. Finish them even if you start to feel better.  You may take over-the-counter medicines to relieve itching.  Do not shave irritated skin.  Follow up with your caregiver as directed. SEEK IMMEDIATE MEDICAL CARE IF:   You have increasing redness, swelling, or pain in the affected area.  You have a fever. MAKE SURE YOU:  Understand these instructions.  Will watch your condition.  Will get help right away if you are not doing well or get worse. Document Released: 10/06/2001 Document Revised: 01/27/2012 Document Reviewed: 10/28/2011 [facility] Patient Information 2015 [place], [place]. This information is not intended to replace advice given to you by your health care provider. Make sure you discuss any questions you have with your health care provider.

## 2014-04-04 ENCOUNTER — Ambulatory Visit (HOSPITAL_COMMUNITY): Payer: Medicare Other | Attending: Endocrinology | Admitting: Cardiology

## 2014-04-04 ENCOUNTER — Other Ambulatory Visit (HOSPITAL_COMMUNITY): Payer: Self-pay

## 2014-04-04 DIAGNOSIS — E1159 Type 2 diabetes mellitus with other circulatory complications: Secondary | ICD-10-CM | POA: Diagnosis not present

## 2014-04-04 DIAGNOSIS — I70219 Atherosclerosis of native arteries of extremities with intermittent claudication, unspecified extremity: Secondary | ICD-10-CM | POA: Diagnosis not present

## 2014-04-04 DIAGNOSIS — E1059 Type 1 diabetes mellitus with other circulatory complications: Secondary | ICD-10-CM

## 2014-04-04 DIAGNOSIS — M25561 Pain in right knee: Secondary | ICD-10-CM

## 2014-04-04 DIAGNOSIS — I739 Peripheral vascular disease, unspecified: Secondary | ICD-10-CM

## 2014-04-04 NOTE — Progress Notes (Signed)
Lower arterial doppler and bilateral duplex performed  

## 2014-04-05 ENCOUNTER — Other Ambulatory Visit: Payer: Self-pay | Admitting: Endocrinology

## 2014-04-05 DIAGNOSIS — I739 Peripheral vascular disease, unspecified: Secondary | ICD-10-CM

## 2014-04-05 DIAGNOSIS — I70269 Atherosclerosis of native arteries of extremities with gangrene, unspecified extremity: Secondary | ICD-10-CM | POA: Insufficient documentation

## 2014-04-06 ENCOUNTER — Other Ambulatory Visit: Payer: Self-pay

## 2014-04-06 ENCOUNTER — Other Ambulatory Visit: Payer: Self-pay | Admitting: Endocrinology

## 2014-04-06 ENCOUNTER — Other Ambulatory Visit (INDEPENDENT_AMBULATORY_CARE_PROVIDER_SITE_OTHER): Payer: Medicare Other

## 2014-04-06 ENCOUNTER — Telehealth (INDEPENDENT_AMBULATORY_CARE_PROVIDER_SITE_OTHER): Payer: Self-pay

## 2014-04-06 DIAGNOSIS — E109 Type 1 diabetes mellitus without complications: Secondary | ICD-10-CM

## 2014-04-06 LAB — HEMOGLOBIN A1C: Hgb A1c MFr Bld: 10.1 % — ABNORMAL HIGH (ref 4.6–6.5)

## 2014-04-06 NOTE — Addendum Note (Signed)
Addended by: Illene Regulus on: 04/06/2014 11:38 AM   Modules accepted: Orders

## 2014-04-06 NOTE — Telephone Encounter (Signed)
Called pt to notify him that I placed the order for labs on the HIV test in epic. I will call pt back once we get the results. Pt understands.

## 2014-04-07 ENCOUNTER — Telehealth (INDEPENDENT_AMBULATORY_CARE_PROVIDER_SITE_OTHER): Payer: Self-pay

## 2014-04-07 LAB — HIV ANTIBODY (ROUTINE TESTING W REFLEX): HIV 1&2 Ab, 4th Generation: NONREACTIVE

## 2014-04-07 NOTE — Telephone Encounter (Signed)
LMOM at home and cell to call back. I want to give him the results on the HIV test that is normal per Dr Johney [place].

## 2014-04-07 NOTE — Telephone Encounter (Signed)
Message copied by Illene Regulus on Fri Apr 07, 2014  5:16 PM ------      Message from: [name]      Created: Fri Apr 07, 2014  6:58 AM       Please call patient to give results and recommendations:            HIV negative .  Please tell pt the good news            [name], M.D., F.A.C.S.      Gastrointestinal and Minimally Invasive Surgery      [facility]      [address], [place]      [place], [place] [id number]      [phone] Main / Paging             ------

## 2014-04-10 NOTE — Telephone Encounter (Signed)
Informed pt of message below. Pt verbalized understanding  

## 2014-04-11 ENCOUNTER — Encounter: Payer: Self-pay | Admitting: Cardiovascular Disease

## 2014-04-11 ENCOUNTER — Ambulatory Visit (INDEPENDENT_AMBULATORY_CARE_PROVIDER_SITE_OTHER): Payer: Medicare Other | Admitting: Cardiovascular Disease

## 2014-04-11 VITALS — BP 140/72 | HR 72 | Ht 71.0 in | Wt 197.8 lb

## 2014-04-11 DIAGNOSIS — F17200 Nicotine dependence, unspecified, uncomplicated: Secondary | ICD-10-CM

## 2014-04-11 DIAGNOSIS — I739 Peripheral vascular disease, unspecified: Secondary | ICD-10-CM

## 2014-04-11 DIAGNOSIS — E7800 Pure hypercholesterolemia, unspecified: Secondary | ICD-10-CM

## 2014-04-11 MED ORDER — CILOSTAZOL 100 MG PO TABS: 100.0000 mg | ORAL_TABLET | Freq: Two times a day (BID) | ORAL | Status: DC

## 2014-04-11 NOTE — Patient Instructions (Signed)
Your physician has recommended you make the following change in your medication:  START Pletal 100mg  take one-half tablet by mouth twice a day for 2 WEEKS and then INCREASE to one tablet by mouth twice a day  Your physician recommends that you schedule a follow-up appointment in: 3 MONTHS with Dr Fletcher Anon

## 2014-04-13 ENCOUNTER — Telehealth: Payer: Self-pay | Admitting: Cardiovascular Disease

## 2014-04-13 DIAGNOSIS — I739 Peripheral vascular disease, unspecified: Secondary | ICD-10-CM

## 2014-04-13 MED ORDER — CILOSTAZOL 100 MG PO TABS: 100.0000 mg | ORAL_TABLET | Freq: Two times a day (BID) | ORAL | Status: DC

## 2014-04-13 NOTE — Telephone Encounter (Signed)
I spoke with the pt and [facility] told the pt that Pletal was on back order and they do not know when they will get a supply in this medication.  The pt wondered if [facility] could fill this prescription. I sent Rx to requested pharmacy and the pt will call back with any other questions or concerns.

## 2014-04-13 NOTE — Telephone Encounter (Signed)
°  Patient was given a RX for Pletal 100mg . Pt states he cant find the medication at the drug stores. Please call and advise.

## 2014-04-14 NOTE — Assessment & Plan Note (Signed)
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 Currently Rutherford class 3.  Due to long occlusion, endovascular intervention is limited by lower success rate and low long term patency.  I recommend an attempted medical therapy, a walking program and a trial of Cilostazol.  Reevaluate symptoms in 3 months.

## 2014-04-14 NOTE — Progress Notes (Signed)
PCP: Dr. Loanne Drilling  HPI  This is a 58 year old male who was referred for evaluation of claudication. He has known history of insulin-requiring DM (dx'ed 1992),  Hyperlipidemia, hypertension and tobacco use. He reports long history (many years) of right calf claudication after walking 100 yard. He has to rest for few minutes before he can resume again. No rest pain or ulceration. No chest pain , dyspnea or cardiac symptoms.  Recent noninvasive evaluation showed : ABI: normal on the left and 0.44 on right.  Duplex showed long occlusion of right SFA from mid to distal segment with reconstitution in popliteal artery. There was significant left SFA stenosis.   No Known Allergies   Current Outpatient Prescriptions on File Prior to Visit  Medication Sig Dispense Refill  . albuterol (PROVENTIL HFA;VENTOLIN HFA) 108 (90 BASE) MCG/ACT inhaler Inhale 2 puffs into the lungs every 6 (six) hours as needed for wheezing.  1 Inhaler  2  . calcium carbonate (TUMS - DOSED IN MG ELEMENTAL CALCIUM) 500 MG chewable tablet Chew 1 tablet by mouth as needed.       . desloratadine (CLARINEX) 5 MG tablet Take 5 mg by mouth as needed.       . doxycycline (VIBRAMYCIN) 100 MG capsule Take 1 capsule (100 mg total) by mouth 2 (two) times daily.  20 capsule  2  . HYDROcodone-acetaminophen (NORCO) 10-325 MG per tablet Take 1 tablet by mouth every 4 (four) hours as needed for moderate pain or severe pain.  100 tablet  0  . losartan-hydrochlorothiazide (HYZAAR) 50-12.5 MG per tablet TAKE ONE TABLET BY MOUTH ONCE DAILY  30 tablet  2  . lovastatin (MEVACOR) 20 MG tablet 2 tabs daily  60 tablet  11   No current facility-administered medications on file prior to visit.     Past Medical History  Diagnosis Date  . Diabetic neuropathy   . Smokers' cough   . Productive cough   . GERD (gastroesophageal reflux disease)   . Hyperlipidemia   . Allergic rhinitis   . History of bladder cancer     s/p  turbt  2013/   transitional  cell carcinoma--   . Hypertension   . Lower urinary tract symptoms (LUTS)   . Type 2 diabetes mellitus with insulin therapy     monitor by  dr ellsion  . Claudication of calf muscles   . History of gout   . Wears dentures     upper  . At risk for sleep apnea     STOP-BANG= 5   SENT TO PCP 03-14-2014  . Hidradenitis suppurativa     h/p massive perineal involvement in past     Past Surgical History  Procedure Laterality Date  . Laser ablation of penile and perianal warts  07-29-2007  Dr. Johney Maine  . Left shoulder surgery  2003  . Cataract extraction w/ intraocular lens implant Right   . Transurethral resection of bladder tumor  05/21/2012    Procedure: TRANSURETHRAL RESECTION OF BLADDER TUMOR (TURBT);  Surgeon: Claybon Jabs, MD;  Location: [facility];  Service: Urology;  Laterality: N/A;     . Inguinal hidradenitis excision  1998, 1999  . Perineal hidradenitis excision  1998, 1999  . Axillary hidradenitis excision  1997  . Laser ablation condolamata N/A 03/20/2014    Procedure: EXAM UNDER ANESTHESIA, REMOVAL/ABLATION OF CONDYLOMATA PENIS,GROINS, ANUS, ANAL CANAL;  Surgeon: [name], MD;  Location: [place];  Service: General;  Laterality:  N/A;  groin and anus  . Co2 laser application N/A 11/17/8117    Procedure: CO2 LASER APPLICATION,PENIS, GROIN, ANUS;  Surgeon: [name], MD;  Location: [place];  Service: General;  Laterality: N/A;     Family History  Problem Relation Age of Onset  . Colon cancer Neg Hx   . Esophageal cancer Neg Hx   . Pancreatic cancer Neg Hx   . Prostate cancer Neg Hx   . Kidney disease Neg Hx   . Liver disease Neg Hx   . Lung cancer Neg Hx   . Diabetes Maternal Aunt     x 2  . Hypertension Mother   . Cancer Father     lung ca     History   Social History  . Marital Status: Married    Spouse Name: N/A    Number of Children: N/A  . Years of Education: N/A   Occupational History    . Not on file.   Social History Main Topics  . Smoking status: Current Every Day Smoker -- 1.00 packs/day for 40 years    Types: Cigarettes  . Smokeless tobacco: Never Used  . Alcohol Use: No  . Drug Use: No  . Sexual Activity: Not on file   Other Topics Concern  . Not on file   Social History Narrative  . No narrative on file     ROS Constitutional: Negative for fever, chills, diaphoresis, activity change, appetite change and fatigue.  HENT: Negative for hearing loss, nosebleeds, congestion, sore throat, facial swelling, drooling, trouble swallowing, neck pain, voice change, sinus pressure and tinnitus.  Eyes: Negative for photophobia, pain, discharge and visual disturbance.  Respiratory: Negative for apnea, cough, chest tightness, shortness of breath and wheezing.  Cardiovascular: Negative for chest pain, palpitations and leg swelling.  Gastrointestinal: Negative for nausea, vomiting, abdominal pain, diarrhea, constipation, blood in stool and abdominal distention.  Genitourinary: Negative for dysuria, urgency, frequency, hematuria and decreased urine volume.  Musculoskeletal: Negative for myalgias, back pain, joint swelling, arthralgias and gait problem.  Skin: Negative for color change, pallor, rash and wound.  Neurological: Negative for dizziness, tremors, seizures, syncope, speech difficulty, weakness, light-headedness, numbness and headaches.  Psychiatric/Behavioral: Negative for suicidal ideas, hallucinations, behavioral problems and agitation. The patient is not nervous/anxious.     PHYSICAL EXAM   BP 140/72  Pulse 72  Ht 5\' 11"  (1.803 m)  Wt 197 lb 12.8 oz (89.721 kg)  BMI 27.60 kg/m2 Constitutional: He is oriented to person, place, and time. He appears well-developed and well-nourished. No distress.  HENT: No nasal discharge.  Head: Normocephalic and atraumatic.  Eyes: Pupils are equal and round.  No discharge. Neck: Normal range of motion. Neck supple. No JVD  present. No thyromegaly present.  Cardiovascular: Normal rate, regular rhythm, normal heart sounds. Exam reveals no gallop and no friction rub. No murmur heard.  Pulmonary/Chest: Effort normal and breath sounds normal. No stridor. No respiratory distress. He has no wheezes. He has no rales. He exhibits no tenderness.  Abdominal: Soft. Bowel sounds are normal. He exhibits no distension. There is no tenderness. There is no rebound and no guarding.  Musculoskeletal: Normal range of motion. He exhibits no edema and no tenderness.  Neurological: He is alert and oriented to person, place, and time. Coordination normal.  Skin: Skin is warm and dry. No rash noted. He is not diaphoretic. No erythema. No pallor.  Psychiatric: He has a normal mood and affect. His behavior is normal.  Judgment and thought content normal.  Vascular: femoral pulse: normal on both sides. Distal pulses: faint on left and not palpable on right.      EKG: recent EKG showed NSR with poor R wave progression   ASSESSMENT AND PLAN

## 2014-04-14 NOTE — Assessment & Plan Note (Signed)
Lab Results  Component Value Date   CHOL 129 06/06/2013   HDL 37.40* 06/06/2013   LDLCALC 68 06/06/2013   TRIG 118.0 06/06/2013   CHOLHDL 3 06/06/2013   Continue Lovastatin.

## 2014-04-14 NOTE — Assessment & Plan Note (Signed)
I had a prolonged discussion about the importance of smoking cessation.

## 2014-04-20 ENCOUNTER — Encounter (INDEPENDENT_AMBULATORY_CARE_PROVIDER_SITE_OTHER): Payer: Medicare Other | Admitting: Surgery

## 2014-05-05 ENCOUNTER — Ambulatory Visit (INDEPENDENT_AMBULATORY_CARE_PROVIDER_SITE_OTHER): Payer: Medicare Other

## 2014-05-05 ENCOUNTER — Telehealth: Payer: Self-pay | Admitting: Endocrinology

## 2014-05-05 DIAGNOSIS — Z23 Encounter for immunization: Secondary | ICD-10-CM

## 2014-05-05 MED ORDER — HYDROCODONE-ACETAMINOPHEN 10-325 MG PO TABS: 1.0000 | ORAL_TABLET | ORAL | Status: DC | PRN

## 2014-05-05 NOTE — Telephone Encounter (Signed)
See below, Thanks!  

## 2014-05-05 NOTE — Telephone Encounter (Signed)
Pt picked up prescription.

## 2014-05-05 NOTE — Telephone Encounter (Signed)
Pt needs hydrocodone rx and when he comes in to pick this up he wants a flu shot.  Please call pt when the rx is ready. He has only enough for today.

## 2014-05-05 NOTE — Telephone Encounter (Signed)
i printed 

## 2014-05-05 NOTE — Telephone Encounter (Signed)
Patient would like to have Dr. Cordelia Pen nurse call him back    Thank you

## 2014-05-17 ENCOUNTER — Telehealth: Payer: Self-pay

## 2014-05-17 NOTE — Telephone Encounter (Signed)
Left a message for call back.  Called patient regarding diabetic eye exam.  When patient calls back please ask:  Have you had a recent (2014-2015) eye exam?    Date of Exam?  Where?    

## 2014-05-18 NOTE — Telephone Encounter (Signed)
Patient's wife said he has not had a diabetic eye exam yet but she is aware he needs one.  So they will set one up and give [place] a call back with the info of when and where he received it.

## 2014-05-22 ENCOUNTER — Telehealth: Payer: Self-pay | Admitting: Endocrinology

## 2014-05-22 MED ORDER — INSULIN NPH ISOPHANE & REGULAR (70-30) 100 UNIT/ML ~~LOC~~ SUSP: SUBCUTANEOUS | Status: DC

## 2014-05-22 NOTE — Telephone Encounter (Signed)
Patient would a refill on his rx  Novolin 70/30  Thank you

## 2014-06-05 ENCOUNTER — Telehealth: Payer: Self-pay | Admitting: Endocrinology

## 2014-06-05 MED ORDER — HYDROCODONE-ACETAMINOPHEN 10-325 MG PO TABS: 1.0000 | ORAL_TABLET | ORAL | Status: DC | PRN

## 2014-06-05 NOTE — Telephone Encounter (Signed)
Patient need refill of hydrocodone 10-325

## 2014-06-05 NOTE — Telephone Encounter (Signed)
i printed 

## 2014-06-05 NOTE — Telephone Encounter (Signed)
See below,  Pt was last seen on 04/03/2014 and medication was last refilled on 05/05/2014.  Thanks!

## 2014-06-05 NOTE — Telephone Encounter (Signed)
[facility] advising pt that rx is ready for pick up. rx placed up front.

## 2014-06-18 ENCOUNTER — Other Ambulatory Visit: Payer: Self-pay | Admitting: Endocrinology

## 2014-07-03 ENCOUNTER — Ambulatory Visit (INDEPENDENT_AMBULATORY_CARE_PROVIDER_SITE_OTHER): Payer: Medicare Other | Admitting: Endocrinology

## 2014-07-03 ENCOUNTER — Encounter: Payer: Self-pay | Admitting: Endocrinology

## 2014-07-03 VITALS — BP 150/84 | HR 89 | Temp 97.9°F | Ht 71.0 in | Wt 209.0 lb

## 2014-07-03 DIAGNOSIS — Z0000 Encounter for general adult medical examination without abnormal findings: Secondary | ICD-10-CM

## 2014-07-03 DIAGNOSIS — Z125 Encounter for screening for malignant neoplasm of prostate: Secondary | ICD-10-CM

## 2014-07-03 DIAGNOSIS — E7800 Pure hypercholesterolemia, unspecified: Secondary | ICD-10-CM

## 2014-07-03 DIAGNOSIS — E109 Type 1 diabetes mellitus without complications: Secondary | ICD-10-CM

## 2014-07-03 DIAGNOSIS — I10 Essential (primary) hypertension: Secondary | ICD-10-CM

## 2014-07-03 DIAGNOSIS — Z0001 Encounter for general adult medical examination with abnormal findings: Secondary | ICD-10-CM | POA: Insufficient documentation

## 2014-07-03 LAB — URINALYSIS, ROUTINE W REFLEX MICROSCOPIC
Bilirubin Urine: NEGATIVE
Hgb urine dipstick: NEGATIVE
Ketones, ur: NEGATIVE
Leukocytes, UA: NEGATIVE
Nitrite: NEGATIVE
Specific Gravity, Urine: <=1.005 — AB (ref 1.000–1.030)
Total Protein, Urine: NEGATIVE
UROBILINOGEN UA: 0.2 (ref 0.0–1.0)
Urine Glucose: NEGATIVE
pH: 5.5 (ref 5.0–8.0)

## 2014-07-03 LAB — BASIC METABOLIC PANEL
BUN: 9 mg/dL (ref 6–23)
CHLORIDE: 104 meq/L (ref 96–112)
CO2: 26 mEq/L (ref 19–32)
CREATININE: 1.1 mg/dL (ref 0.4–1.5)
Calcium: 10 mg/dL (ref 8.4–10.5)
GFR: 71.64 mL/min (ref >60.00)
Glucose, Bld: 136 mg/dL — ABNORMAL HIGH (ref 70–99)
Potassium: 3.9 mEq/L (ref 3.5–5.1)
Sodium: 139 mEq/L (ref 135–145)

## 2014-07-03 LAB — CBC WITH DIFFERENTIAL/PLATELET
BASOS PCT: 0.6 % (ref 0.0–3.0)
Basophils Absolute: 0.1 10*3/uL (ref 0.0–0.1)
EOS ABS: 0.3 10*3/uL (ref 0.0–0.7)
Eosinophils Relative: 3.4 % (ref 0.0–5.0)
HCT: 51.2 % (ref 39.0–52.0)
Hemoglobin: 17.5 g/dL — ABNORMAL HIGH (ref 13.0–17.0)
LYMPHS PCT: 28.5 % (ref 12.0–46.0)
Lymphs Abs: 2.8 10*3/uL (ref 0.7–4.0)
MCHC: 34.1 g/dL (ref 30.0–36.0)
MCV: 97.3 fl (ref 78.0–100.0)
Monocytes Absolute: 0.9 10*3/uL (ref 0.1–1.0)
Monocytes Relative: 9.1 % (ref 3.0–12.0)
NEUTROS PCT: 58.4 % (ref 43.0–77.0)
Neutro Abs: 5.8 10*3/uL (ref 1.4–7.7)
PLATELETS: 294 10*3/uL (ref 150.0–400.0)
RBC: 5.26 Mil/uL (ref 4.22–5.81)
RDW: 13 % (ref 11.5–15.5)
WBC: 10 10*3/uL (ref 4.0–10.5)

## 2014-07-03 LAB — HEPATIC FUNCTION PANEL
ALBUMIN: 4.2 g/dL (ref 3.5–5.2)
ALK PHOS: 95 U/L (ref 39–117)
ALT: 28 U/L (ref 0–53)
AST: 22 U/L (ref 0–37)
BILIRUBIN DIRECT: 0.2 mg/dL (ref 0.0–0.3)
TOTAL PROTEIN: 7.2 g/dL (ref 6.0–8.3)
Total Bilirubin: 0.6 mg/dL (ref 0.2–1.2)

## 2014-07-03 LAB — PSA: PSA: 1.85 ng/mL (ref 0.10–4.00)

## 2014-07-03 LAB — LIPID PANEL
CHOL/HDL RATIO: 4
Cholesterol: 126 mg/dL (ref 0–200)
HDL: 34.2 mg/dL — ABNORMAL LOW (ref >39.00)
LDL CALC: 69 mg/dL (ref 0–99)
NONHDL: 91.8
TRIGLYCERIDES: 115 mg/dL (ref 0.0–149.0)
VLDL: 23 mg/dL (ref 0.0–40.0)

## 2014-07-03 LAB — MICROALBUMIN / CREATININE URINE RATIO
CREATININE, U: 34.6 mg/dL
MICROALB/CREAT RATIO: 2.6 mg/g (ref 0.0–30.0)
Microalb, Ur: 0.9 mg/dL (ref 0.0–1.9)

## 2014-07-03 LAB — TSH: TSH: 1.5 u[IU]/mL (ref 0.35–4.50)

## 2014-07-03 LAB — HEMOGLOBIN A1C: Hgb A1c MFr Bld: 8.6 % — ABNORMAL HIGH (ref 4.6–6.5)

## 2014-07-03 MED ORDER — HYDROCODONE-ACETAMINOPHEN 10-325 MG PO TABS: 1.0000 | ORAL_TABLET | ORAL | Status: DC | PRN

## 2014-07-03 MED ORDER — INSULIN LISPRO PROT & LISPRO (75-25 MIX) 100 UNIT/ML KWIKPEN: PEN_INJECTOR | SUBCUTANEOUS | Status: DC

## 2014-07-03 NOTE — Progress Notes (Signed)
Subjective:    Patient ID: [name], male    DOB: 1956-04-01, 58 y.o.   MRN: [id number]  HPI  Pt is here for regular wellness examination, and is feeling pretty well in general, and says chronic med probs are stable, except as noted below Past Medical History  Diagnosis Date  . Diabetic neuropathy   . Smokers' cough   . Productive cough   . GERD (gastroesophageal reflux disease)   . Hyperlipidemia   . Allergic rhinitis   . History of bladder cancer     s/p  turbt  2013/   transitional cell carcinoma--   . Hypertension   . Lower urinary tract symptoms (LUTS)   . Type 2 diabetes mellitus with insulin therapy     monitor by  dr ellsion  . Claudication of calf muscles   . History of gout   . Wears dentures     upper  . At risk for sleep apnea     STOP-BANG= 5   SENT TO PCP 03-14-2014  . Hidradenitis suppurativa     h/p massive perineal involvement in past    Past Surgical History  Procedure Laterality Date  . Laser ablation of penile and perianal warts  07-29-2007  Dr. Johney Maine  . Left shoulder surgery  2003  . Cataract extraction w/ intraocular lens implant Right   . Transurethral resection of bladder tumor  05/21/2012    Procedure: TRANSURETHRAL RESECTION OF BLADDER TUMOR (TURBT);  Surgeon: Claybon Jabs, MD;  Location: [facility];  Service: Urology;  Laterality: N/A;     . Inguinal hidradenitis excision  1998, 1999  . Perineal hidradenitis excision  1998, 1999  . Axillary hidradenitis excision  1997  . Laser ablation condolamata N/A 03/20/2014    Procedure: EXAM UNDER ANESTHESIA, REMOVAL/ABLATION OF CONDYLOMATA PENIS,GROINS, ANUS, ANAL CANAL;  Surgeon: [name], MD;  Location: [place];  Service: General;  Laterality: N/A;  groin and anus  . Co2 laser application N/A 01/10/931    Procedure: CO2 LASER APPLICATION,PENIS, GROIN, ANUS;  Surgeon: [name], MD;  Location: [place];  Service: General;   Laterality: N/A;    History   Social History  . Marital Status: Married    Spouse Name: N/A    Number of Children: N/A  . Years of Education: N/A   Occupational History  . Not on file.   Social History Main Topics  . Smoking status: Current Every Day Smoker -- 1.00 packs/day for 40 years    Types: Cigarettes  . Smokeless tobacco: Never Used  . Alcohol Use: No  . Drug Use: No  . Sexual Activity: Not on file   Other Topics Concern  . Not on file   Social History Narrative    Current Outpatient Prescriptions on File Prior to Visit  Medication Sig Dispense Refill  . albuterol (PROVENTIL HFA;VENTOLIN HFA) 108 (90 BASE) MCG/ACT inhaler Inhale 2 puffs into the lungs every 6 (six) hours as needed for wheezing. 1 Inhaler 2  . calcium carbonate (TUMS - DOSED IN MG ELEMENTAL CALCIUM) 500 MG chewable tablet Chew 1 tablet by mouth as needed.     . cilostazol (PLETAL) 100 MG tablet Take 1 tablet (100 mg total) by mouth 2 (two) times daily. 60 tablet 6  . desloratadine (CLARINEX) 5 MG tablet Take 5 mg by mouth as needed.     Marland Kitchen losartan-hydrochlorothiazide (HYZAAR) 50-12.5 MG per tablet TAKE ONE TABLET BY MOUTH  ONCE DAILY 30 tablet 0  . lovastatin (MEVACOR) 20 MG tablet 2 tabs daily 60 tablet 11   No current facility-administered medications on file prior to visit.    No Known Allergies  Family History  Problem Relation Age of Onset  . Colon cancer Neg Hx   . Esophageal cancer Neg Hx   . Pancreatic cancer Neg Hx   . Prostate cancer Neg Hx   . Kidney disease Neg Hx   . Liver disease Neg Hx   . Lung cancer Neg Hx   . Diabetes Maternal Aunt     x 2  . Hypertension Mother   . Cancer Father     lung ca    BP 150/84 mmHg  Pulse 89  Temp(Src) 97.9 F (36.6 C) (Oral)  Ht 5\' 11"  (1.803 m)  Wt 209 lb (94.802 kg)  BMI 29.16 kg/m2  SpO2 97%     Review of Systems  Constitutional: Negative for fever.  HENT: Negative for hearing loss.   Eyes:       No change in chronic  visual loss.  Respiratory: Negative for shortness of breath.   Gastrointestinal: Negative for anal bleeding.  Endocrine: Negative for cold intolerance.  Genitourinary: Negative for hematuria.  Musculoskeletal: Negative for back pain.  Skin: Negative for rash.  Allergic/Immunologic: Negative for environmental allergies.  Neurological: Negative for headaches.  Hematological: Does not bruise/bleed easily.  Psychiatric/Behavioral: Negative for dysphoric mood.       Objective:   Physical Exam VS: see vs page GEN: no distress HEAD: head: no deformity eyes: no periorbital swelling, no proptosis external nose and ears are normal mouth: no lesion seen NECK: supple, thyroid is not enlarged CHEST WALL: no deformity LUNGS: clear to auscultation BREASTS:  No gynecomastia CV: reg rate and rhythm, no murmur ABD: abdomen is soft, nontender.  no hepatosplenomegaly.  not distended.  no hernia GENITALIA/RECTAL/PROSTATE: sees urology MUSCULOSKELETAL: muscle bulk and strength are grossly normal.  no obvious joint swelling.  gait is normal and steady EXTEMITIES: no deformity.  no ulcer on the feet.  feet are of normal color and temp.  no edema PULSES: dorsalis pedis absent bilat.  no carotid bruit.   NEURO:  cn 2-12 grossly intact.   readily moves all 4's.  sensation is intact to touch on the feet SKIN:  Normal texture and temperature.  No rash or suspicious lesion is visible.   NODES:  None palpable at the neck PSYCH: alert, well-oriented.  Does not appear anxious nor depressed.       Assessment & Plan:  Wellness visit today, with problems stable, except as noted.    SEPARATE EVALUATION FOLLOWS--EACH PROBLEM HERE IS NEW, NOT RESPONDING TO TREATMENT, OR POSES SIGNIFICANT RISK TO THE PATIENT'S HEALTH: HISTORY OF THE PRESENT ILLNESS: Pt returns for f/u of diabetes mellitus: DM type: Insulin-requiring type 2 Dx'ed: 2774 Complications: PAD Therapy: insulin since 2009 DKA: never Severe  hypoglycemia: never Pancreatitis: never Other: he has been on a simple regimen, after poor results with multiple daily injections; he has requested inexpensive human insulin Interval history: he brings a record of his cbg's which i have reviewed today.  It varies from 98-200.  There is no trend throughout the day.  PAST MEDICAL HISTORY reviewed and up to date today REVIEW OF SYSTEMS: Denies syncope and weight change PHYSICAL EXAMINATION: VITAL SIGNS:  See vs page GENERAL: no distress Pulses: dorsalis pedis intact bilat.   Feet: no deformity.  no edema Skin:  no ulcer on  the feet.  normal color and temp. Neuro: sensation is intact to touch on the feet. LAB/XRAY RESULTS: Lab Results  Component Value Date   HGBA1C 8.6* 07/03/2014  IMPRESSION: DM: moderate exacerbation PLAN:  Pt is advised to increase insulin.

## 2014-07-03 NOTE — Patient Instructions (Addendum)
Please continue the same blood pressure medication for now. please consider these measures for your health:  minimize alcohol.  do not use tobacco products.  have a colonoscopy at least every 10 years from age 58.  keep firearms safely stored.  always use seat belts.  have working smoke alarms in your home.  see an eye doctor and dentist regularly.  never drive under the influence of alcohol or drugs (including prescription drugs).  those with fair skin should take precautions against the sun.  Please come back for a follow-up appointment in 3 months.   Here is a prescription to change your insulin to one covered by your insurance.   blood tests are being requested for you today.  We'll let you know about the results.

## 2014-07-11 ENCOUNTER — Ambulatory Visit: Payer: Medicare Other | Admitting: Cardiovascular Disease

## 2014-07-18 ENCOUNTER — Other Ambulatory Visit: Payer: Self-pay | Admitting: Endocrinology

## 2014-07-18 ENCOUNTER — Ambulatory Visit (INDEPENDENT_AMBULATORY_CARE_PROVIDER_SITE_OTHER): Payer: Medicare Other | Admitting: Cardiovascular Disease

## 2014-07-18 VITALS — BP 160/80 | HR 78 | Ht 71.0 in | Wt 210.5 lb

## 2014-07-18 DIAGNOSIS — F17200 Nicotine dependence, unspecified, uncomplicated: Secondary | ICD-10-CM

## 2014-07-18 DIAGNOSIS — R079 Chest pain, unspecified: Secondary | ICD-10-CM

## 2014-07-18 DIAGNOSIS — E7800 Pure hypercholesterolemia, unspecified: Secondary | ICD-10-CM

## 2014-07-18 DIAGNOSIS — I739 Peripheral vascular disease, unspecified: Secondary | ICD-10-CM

## 2014-07-18 DIAGNOSIS — Z720 Tobacco use: Secondary | ICD-10-CM

## 2014-07-18 DIAGNOSIS — R0789 Other chest pain: Secondary | ICD-10-CM | POA: Insufficient documentation

## 2014-07-18 NOTE — Patient Instructions (Signed)
Your physician has requested that you have a lexiscan myoview. For further information please visit [email or web address]. Please follow instruction sheet, as given.  Your physician wants you to follow-up in: 6 MONTHS with Dr Fletcher Anon.  You will receive a reminder letter in the mail two months in advance. If you don't receive a letter, please call our office to schedule the follow-up appointment.  Your physician recommends that you continue on your current medications as directed. Please refer to the Current Medication list given to you today.

## 2014-07-18 NOTE — Assessment & Plan Note (Signed)
His chest pain is atypical. However, he has multiple risk factors for coronary artery disease and established history of atherosclerosis. I recommend evaluation with a pharmacologic nuclear stress test. He is not able to exercise on a treadmill due to claudication.

## 2014-07-18 NOTE — Assessment & Plan Note (Signed)
I had a prolonged discussion with him about the importance of smoking cessation. I discussed with him the dangers of continued smoking and the possibility of progression to critical limb ischemia

## 2014-07-18 NOTE — Progress Notes (Signed)
PCP: Dr. Loanne Drilling  HPI  This is a 58 year old male who is here today for a follow up visit regarding PAD. He has known history of insulin-requiring DM (dx'ed 1992),  Hyperlipidemia, hypertension and tobacco use. He reports long history (many years) of right calf claudication after walking 100 yard. He has to rest for few minutes before he can resume again. No rest pain or ulceration. No chest pain , dyspnea or cardiac symptoms.  Recent noninvasive evaluation showed : ABI: normal on the left and 0.44 on right.  Duplex showed long occlusion of right SFA from mid to distal segment with reconstitution in popliteal artery. There was significant left SFA stenosis.  I started him on Pletal. He reports improvement in symptoms. His walking distance is similar but with less discomfort. He reports recent episodes of substernal heaviness. He had one episode about a week ago that lasted for about an hour. He had milder episodes since then but this is not consistent with activities.  No Known Allergies   Current Outpatient Prescriptions on File Prior to Visit  Medication Sig Dispense Refill  . albuterol (PROVENTIL HFA;VENTOLIN HFA) 108 (90 BASE) MCG/ACT inhaler Inhale 2 puffs into the lungs every 6 (six) hours as needed for wheezing. 1 Inhaler 2  . calcium carbonate (TUMS - DOSED IN MG ELEMENTAL CALCIUM) 500 MG chewable tablet Chew 1 tablet by mouth as needed.     . cilostazol (PLETAL) 100 MG tablet Take 1 tablet (100 mg total) by mouth 2 (two) times daily. 60 tablet 6  . HYDROcodone-acetaminophen (NORCO) 10-325 MG per tablet Take 1 tablet by mouth every 4 (four) hours as needed for moderate pain or severe pain. 100 tablet 0  . lovastatin (MEVACOR) 20 MG tablet 2 tabs daily 60 tablet 11   No current facility-administered medications on file prior to visit.     Past Medical History  Diagnosis Date  . Diabetic neuropathy   . Smokers' cough   . Productive cough   . GERD (gastroesophageal reflux disease)    . Hyperlipidemia   . Allergic rhinitis   . History of bladder cancer     s/p  turbt  2013/   transitional cell carcinoma--   . Hypertension   . Lower urinary tract symptoms (LUTS)   . Type 2 diabetes mellitus with insulin therapy     monitor by  dr ellsion  . Claudication of calf muscles   . History of gout   . Wears dentures     upper  . At risk for sleep apnea     STOP-BANG= 5   SENT TO PCP 03-14-2014  . Hidradenitis suppurativa     h/p massive perineal involvement in past     Past Surgical History  Procedure Laterality Date  . Laser ablation of penile and perianal warts  07-29-2007  Dr. Johney Maine  . Left shoulder surgery  2003  . Cataract extraction w/ intraocular lens implant Right   . Transurethral resection of bladder tumor  05/21/2012    Procedure: TRANSURETHRAL RESECTION OF BLADDER TUMOR (TURBT);  Surgeon: Claybon Jabs, MD;  Location: [facility];  Service: Urology;  Laterality: N/A;     . Inguinal hidradenitis excision  1998, 1999  . Perineal hidradenitis excision  1998, 1999  . Axillary hidradenitis excision  1997  . Laser ablation condolamata N/A 03/20/2014    Procedure: EXAM UNDER ANESTHESIA, REMOVAL/ABLATION OF CONDYLOMATA PENIS,GROINS, ANUS, ANAL CANAL;  Surgeon: [name], MD;  Location: [place];  Service: General;  Laterality: N/A;  groin and anus  . Co2 laser application N/A 11/01/5571    Procedure: CO2 LASER APPLICATION,PENIS, GROIN, ANUS;  Surgeon: [name], MD;  Location: [place];  Service: General;  Laterality: N/A;     Family History  Problem Relation Age of Onset  . Colon cancer Neg Hx   . Esophageal cancer Neg Hx   . Pancreatic cancer Neg Hx   . Prostate cancer Neg Hx   . Kidney disease Neg Hx   . Liver disease Neg Hx   . Lung cancer Neg Hx   . Diabetes Maternal Aunt     x 2  . Hypertension Mother   . Cancer Father     lung ca     History   Social History  . Marital  Status: Married    Spouse Name: N/A    Number of Children: N/A  . Years of Education: N/A   Occupational History  . Not on file.   Social History Main Topics  . Smoking status: Current Every Day Smoker -- 1.00 packs/day for 40 years    Types: Cigarettes  . Smokeless tobacco: Never Used  . Alcohol Use: No  . Drug Use: No  . Sexual Activity: Not on file   Other Topics Concern  . Not on file   Social History Narrative        PHYSICAL EXAM   BP 160/80 mmHg  Pulse 78  Ht 5\' 11"  (1.803 m)  Wt 210 lb 8 oz (95.482 kg)  BMI 29.37 kg/m2 Constitutional: He is oriented to person, place, and time. He appears well-developed and well-nourished. No distress.  HENT: No nasal discharge.  Head: Normocephalic and atraumatic.  Eyes: Pupils are equal and round.  No discharge. Neck: Normal range of motion. Neck supple. No JVD present. No thyromegaly present.  Cardiovascular: Normal rate, regular rhythm, normal heart sounds. Exam reveals no gallop and no friction rub. No murmur heard.  Pulmonary/Chest: Effort normal and breath sounds normal. No stridor. No respiratory distress. He has no wheezes. He has no rales. He exhibits no tenderness.  Abdominal: Soft. Bowel sounds are normal. He exhibits no distension. There is no tenderness. There is no rebound and no guarding.  Musculoskeletal: Normal range of motion. He exhibits no edema and no tenderness.  Neurological: He is alert and oriented to person, place, and time. Coordination normal.  Skin: Skin is warm and dry. No rash noted. He is not diaphoretic. No erythema. No pallor.  Psychiatric: He has a normal mood and affect. His behavior is normal. Judgment and thought content normal.  Vascular: femoral pulse: normal on both sides. Distal pulses: faint on left and not palpable on right.      EKG: Normal sinus rhythm with no significant ST or T wave changes.   ASSESSMENT AND PLAN

## 2014-07-18 NOTE — Assessment & Plan Note (Signed)
Lab Results  Component Value Date   CHOL 126 07/03/2014   HDL 34.20* 07/03/2014   LDLCALC 69 07/03/2014   TRIG 115.0 07/03/2014   CHOLHDL 4 07/03/2014   Continue treatment with lovastatin.

## 2014-07-18 NOTE — Assessment & Plan Note (Signed)
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 Currently Rutherford class 3. However, symptoms improved with medical therapy and Pletal. Due to long occlusion, endovascular intervention is limited by lower success rate and low long term patency.  Recommend continuing medical therapy for now.

## 2014-07-21 ENCOUNTER — Encounter: Payer: Self-pay | Admitting: Cardiovascular Disease

## 2014-07-24 ENCOUNTER — Telehealth: Payer: Self-pay | Admitting: Endocrinology

## 2014-07-24 ENCOUNTER — Ambulatory Visit (HOSPITAL_COMMUNITY): Payer: Medicare Other | Attending: Cardiology | Admitting: Radiology

## 2014-07-24 DIAGNOSIS — R079 Chest pain, unspecified: Secondary | ICD-10-CM | POA: Insufficient documentation

## 2014-07-24 DIAGNOSIS — I739 Peripheral vascular disease, unspecified: Secondary | ICD-10-CM | POA: Insufficient documentation

## 2014-07-24 DIAGNOSIS — Z136 Encounter for screening for cardiovascular disorders: Secondary | ICD-10-CM | POA: Diagnosis present

## 2014-07-24 DIAGNOSIS — I10 Essential (primary) hypertension: Secondary | ICD-10-CM | POA: Diagnosis not present

## 2014-07-24 DIAGNOSIS — Z6828 Body mass index (BMI) 28.0-28.9, adult: Secondary | ICD-10-CM | POA: Diagnosis not present

## 2014-07-24 DIAGNOSIS — R0600 Dyspnea, unspecified: Secondary | ICD-10-CM

## 2014-07-24 DIAGNOSIS — Z794 Long term (current) use of insulin: Secondary | ICD-10-CM | POA: Diagnosis not present

## 2014-07-24 DIAGNOSIS — E119 Type 2 diabetes mellitus without complications: Secondary | ICD-10-CM | POA: Insufficient documentation

## 2014-07-24 MED ORDER — TECHNETIUM TC 99M SESTAMIBI GENERIC - CARDIOLITE
11.0000 | Freq: Once | INTRAVENOUS | Status: AC | PRN
  Administered 2014-07-24: 11 via INTRAVENOUS

## 2014-07-24 MED ORDER — TECHNETIUM TC 99M SESTAMIBI GENERIC - CARDIOLITE
33.0000 | Freq: Once | INTRAVENOUS | Status: AC | PRN
  Administered 2014-07-24: 33 via INTRAVENOUS

## 2014-07-24 MED ORDER — REGADENOSON 0.4 MG/5ML IV SOLN
0.4000 mg | Freq: Once | INTRAVENOUS | Status: AC
  Administered 2014-07-24: 0.4 mg via INTRAVENOUS

## 2014-07-24 NOTE — Telephone Encounter (Signed)
Contacted pt. He states that he had his stress test today and his blood pressure was high. Pt wanted to know what the results from the stress test. Please advise, Thanks!

## 2014-07-24 NOTE — Telephone Encounter (Signed)
Pt advised he would receive a phone call once the results were available. Pt voiced understanding.

## 2014-07-24 NOTE — Telephone Encounter (Signed)
please call patient: Results are not yet available.  i'll let you know when they are

## 2014-07-24 NOTE — Progress Notes (Signed)
[facility] [address] [place], [place] [address] [phone]    Cardiology Nuclear Med Study  [name] is a 58 y.o. male     MRN : [id number]     DOB: 1955-10-23  Procedure Date: 07/24/2014  Nuclear Med Background Indication for Stress Test:  Evaluation for Ischemia History:  none CAD Cardiac Risk Factors: Hypertension, IDDM,  and PAD  Symptoms:  Chest Pain with Exertion (last date of chest discomfort 4 days ago)   Nuclear Pre-Procedure Caffeine/Decaff Intake:  None> 12 hrs NPO After: 6:00am   Lungs:  clear O2 Sat: 98% on room air. IV 0.9% NS with Angio Cath:  22g  IV Site: R Antecubital x 1, tolerated well IV Started by:  Irven Baltimore, RN  Chest Size (in):  42 Cup Size: n/a  Height: 5\' 11"  (1.803 m)  Weight:  207 lb (93.895 kg)  BMI:  Body mass index is 28.88 kg/(m^2). Tech Comments:  1/2 dose Humalog Insulin with breakfast. 6 hr PC CBG was 201 on arrival at 12:00 noon today. 8 3/4 hr PC CBG taken at request of the patient was 250 at 1445. Irven Baltimore, RN.    Nuclear Med Study 1 or 2 day study: 1 day  Stress Test Type:  Treadmill/Lexiscan  Reading MD: N/A  Order Authorizing Provider:  Kathlyn Sacramento, MD  Resting Radionuclide: Technetium 44m Sestamibi  Resting Radionuclide Dose: 11.0 mCi   Stress Radionuclide:  Technetium 5m Sestamibi  Stress Radionuclide Dose: 33.0 mCi           Stress Protocol Rest HR: 87 Stress HR: 122  Rest BP: 152/77 Stress BP: 212/53  Exercise Time (min): 2:00 METS: 1.6   Predicted Max HR: 163 bpm % Max HR: 74.85 bpm Rate Pressure Product: 93810   Dose of Adenosine (mg):  n/a Dose of Lexiscan: 0.4 mg  Dose of Atropine (mg): n/a Dose of Dobutamine: n/a mcg/kg/min (at max HR)  Stress Test Technologist: Matilde Haymaker, RN  Nuclear Technologist:  Vedia Pereyra, CNMT     Rest Procedure:  Myocardial perfusion imaging was performed at rest 45 minutes following the intravenous administration of  Technetium 50m Sestamibi. Rest ECG: NSR poor R wave progression  Stress Procedure:  The patient received IV Lexiscan 0.4 mg over 15-seconds with concurrent low level exercise and then Technetium 76m Sestamibi was injected at 30-seconds while the patient continued walking one more minute. Patient had mild SOB and dizziness with low level exercise.  Quantitative spect images were obtained after a 45-minute delay. Stress ECG: No significant change from baseline ECG  QPS Raw Data Images:  Patient motion noted. Stress Images:  Apical thinning and small inferolateral wall defect at mid and basal level Rest Images:  Apical thinning and small inferolateral wall defect at mid and basal level Subtraction (SDS):  SDS 4 abnormal at apex Transient Ischemic Dilatation (Normal <1.22):  1.15 Lung/Heart Ratio (Normal <0.45):  0.32  Quantitative Gated Spect Images QGS EDV:  93 ml QGS ESV:  38 ml  Impression Exercise Capacity:  Lexiscan with low level exercise. BP Response:  Hypertensive blood pressure response. Clinical Symptoms:  There is dyspnea. ECG Impression:  No significant ST segment change suggestive of ischemia. 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  LV Ejection Fraction: 59%.  LV Wall Motion:  NL  LV Function; NL Wall Motion   [name]

## 2014-07-24 NOTE — Telephone Encounter (Signed)
Patients wife called stating that Mr. Strey blood pressure has been high     Please advise patient   Thank you

## 2014-07-27 ENCOUNTER — Encounter: Payer: Self-pay | Admitting: Cardiovascular Disease

## 2014-07-31 ENCOUNTER — Telehealth: Payer: Self-pay | Admitting: Endocrinology

## 2014-07-31 NOTE — Telephone Encounter (Signed)
Pt needs hydrocodone rx

## 2014-07-31 NOTE — Telephone Encounter (Signed)
Not clear what the patient is taking pain medication for

## 2014-07-31 NOTE — Telephone Encounter (Signed)
Pt is requesting a refill on his Hydrocodone. Medication was last refilled on 07/03/2014. Could you refill for a 30 day supple during Dr. Cordelia Pen absence? Thanks!

## 2014-08-02 NOTE — Telephone Encounter (Signed)
Pt advised that he would have to wait until January 4th to get his medication refilled when Dr. Loanne Drilling returns due to note below.

## 2014-08-14 ENCOUNTER — Telehealth: Payer: Self-pay | Admitting: Endocrinology

## 2014-08-14 MED ORDER — HYDROCODONE-ACETAMINOPHEN 10-325 MG PO TABS: 1.0000 | ORAL_TABLET | ORAL | Status: DC | PRN

## 2014-08-14 NOTE — Telephone Encounter (Signed)
Patient need refill of Hydrocodone

## 2014-08-14 NOTE — Telephone Encounter (Signed)
Pt advised that rx is ready for pick up rx placed up front.

## 2014-08-14 NOTE — Telephone Encounter (Signed)
See below. rx was last refilled on 07/03/2014. Thanks!

## 2014-08-14 NOTE — Telephone Encounter (Signed)
i printed 

## 2014-08-16 ENCOUNTER — Other Ambulatory Visit: Payer: Self-pay | Admitting: Endocrinology

## 2014-09-12 ENCOUNTER — Telehealth: Payer: Self-pay | Admitting: Endocrinology

## 2014-09-12 MED ORDER — HYDROCODONE-ACETAMINOPHEN 10-325 MG PO TABS: 1.0000 | ORAL_TABLET | ORAL | Status: DC | PRN

## 2014-09-12 NOTE — Telephone Encounter (Signed)
Patient would like to have his hydrocodone filled  Also, patient would like to have his cough syrup called in that Dr. Loanne Drilling has prescribed for him    Please advise   Thank you

## 2014-09-12 NOTE — Telephone Encounter (Signed)
i printed vicodin rx Ov would be needed to evaluate cough

## 2014-09-12 NOTE — Telephone Encounter (Signed)
Pt advised of note below and voiced understanding.  

## 2014-09-13 ENCOUNTER — Other Ambulatory Visit (INDEPENDENT_AMBULATORY_CARE_PROVIDER_SITE_OTHER): Payer: Self-pay | Admitting: Surgery

## 2014-09-13 DIAGNOSIS — Z720 Tobacco use: Secondary | ICD-10-CM | POA: Diagnosis not present

## 2014-09-13 DIAGNOSIS — L732 Hidradenitis suppurativa: Secondary | ICD-10-CM | POA: Diagnosis not present

## 2014-09-13 DIAGNOSIS — A630 Anogenital (venereal) warts: Secondary | ICD-10-CM | POA: Diagnosis not present

## 2014-09-13 NOTE — H&P (Signed)
[name] 09/13/2014 11:02 AM Location: [facility] Patient #: [id number] DOB: 1956-06-18 Married / Language: [name] / Race: White Male [place] All Reviewed History of Present Illness [name]; 09/13/2014 11:53 AM) Patient words: recheck hidradenitis.  The patient is a 59 year old male presenting for a post-operative visit. [name] returns for surgical re-evaluation. He feels like he is doing okay. He continues to smoke. He is a diabetic -working on his control. Had increased blood pressure. Tells me struggle to get some of his medications sometimes. He is HIV negative. He does note h his opening between his left scrotum and inner thigh has gotten bigger. Intermittent drainage. Feels lump on his scrotum that occasionally he pops to drain. Perianal pain gone. No constipation. No fevers or chills. Energy level OK. Notes a few warts on his inner thigh. Sep 04, 1955 [id number] Patient Care Team: Paul Shin, [name] as PCP - General Paul Castle, [name] as Consulting Physician (Gastroenterology) Paul Jabs, [name] as Consulting Physician (Urology) Procedure (Date: 03/20/2014): POST-OPERATIVE DIAGNOSIS: condolamata acuminatum of penis, groins, perianal region PROCEDURE: Procedure(s): EXAM UNDER ANESTHESIA, REMOVAL/ABLATION OF CONDYLOMATA PENIS,GROINS, ANUS, ANAL CANAL SURGEON: Surgeon(s): Paul Hector, [name] Paul Jabs, [name] ANESTHESIA: local and general Diagnosis 1. Condyloma, left groin - CONDYLOMA ACUMINATUM 2. 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) s/p wide excisions/skin grafts of perineum in distant past complicated by poor lymphatic drainage &  hydrophallus  Other Problems Paul Buffy, [name]; 09/13/2014 11:03 AM) Cancer Bladder Problems  Past Surgical History Paul Buffy, [name]; 09/13/2014 11:03 AM) Gallbladder Surgery - Open ABLATION, CONDYLOMA, USING CO2 LASER (37169) 03/20/2014  Diagnostic Studies History Paul Buffy, [name]; 09/13/2014 11:03 AM) Colonoscopy 1-5 years ago  Allergies Paul Buffy, [name]; 09/13/2014 11:03 AM) No Known Drug Allergies 04/20/2014  Medication History Paul Buffy, [name]; 09/13/2014 11:03 AM) Albuterol Sulfate HFA (108 (90 Base)MCG/ACT Aerosol Soln, Inhalation) Active. Norco (10-325MG  Tablet, Oral) Active. NovoLIN 70/30 ReliOn ((70-30) 100UNIT/ML Suspension, Subcutaneous) Active. Hyzaar (50-12.5MG  Tablet, Oral) Active. Lovastatin (20MG  Tablet, Oral) Active. Medications Reconciled  Social History Paul Buffy, [name]; 09/13/2014 11:03 AM) Caffeine use Coffee. Illicit drug use Prefer to discuss with provider. Alcohol use Remotely quit alcohol use. Tobacco use Current every day smoker.  Family History Paul Buffy, [name]; 09/13/2014 11:03 AM) Alcohol Abuse Father, Mother. Hypertension Mother. Melanoma Father.     Review of Systems [name]; 09/13/2014 11:35 AM) [name] as Reviewed General Not Present- Appetite Loss, Chills, Fatigue, Fever, Night Sweats, Weight Gain and Weight Loss. Skin Present- Change in Wart/Mole. Not Present- Dryness, Hives, Jaundice, New Lesions, Non-Healing Wounds, Rash and Ulcer. HEENT Not Present- Earache, Hearing Loss, Hoarseness, Nose Bleed, Oral Ulcers, Ringing in the Ears, Seasonal Allergies, Sinus Pain, Sore Throat, Visual Disturbances, Wears glasses/contact lenses and Yellow Eyes. Respiratory Not Present- Bloody sputum, Chronic Cough, Difficulty Breathing, Snoring and Wheezing. Breast Not Present- Breast Mass, Breast Pain, Nipple Discharge and Skin Changes. Cardiovascular Not Present- Chest Pain, Difficulty Breathing Lying Down, Leg  Cramps, Palpitations, Rapid Heart Rate, Shortness of Breath and Swelling of Extremities. Gastrointestinal Not Present- Abdominal Pain, Bloating, Bloody Stool, Change in Bowel Habits, Chronic diarrhea, Constipation, Difficulty Swallowing, Excessive gas, Gets full quickly at meals, Hemorrhoids, Indigestion, Nausea, Rectal Pain and Vomiting. Male Genitourinary  Not Present- Blood in Urine, Change in Urinary Stream, Frequency, Impotence, Nocturia, Painful Urination, Urgency and Urine Leakage. Musculoskeletal Not Present- Back Pain, Joint Pain, Joint Stiffness, Muscle Pain, Muscle Weakness and Swelling of Extremities. Neurological Not Present- Decreased Memory, Fainting, Headaches, Numbness, Seizures, Tingling, Tremor, Trouble walking and Weakness. Psychiatric Not Present- Anxiety, Bipolar, Change in Sleep Pattern, Depression, Fearful and Frequent crying. Endocrine Not Present- Cold Intolerance, Excessive Hunger, Hair Changes, Heat Intolerance and New Diabetes. Hematology Not Present- Easy Bruising, Excessive bleeding, Gland problems, HIV and Persistent Infections.  Vitals [name]; 09/13/2014 11:03 AM) 09/13/2014 11:02 AM Weight: 208.2 lb Height: 71in Body Surface Area: 2.18 m Body Mass Index: 29.04 kg/m Temp.: 66F(Oral)  Pulse: 74 (Regular)  Resp.: 20 (Unlabored)  BP: 154/80 (Sitting, Left Arm, Standard)     Physical Exam [name]; 09/13/2014 11:52 AM)  General Mental Status-Alert. General Appearance-Not in acute distress. Voice-Normal.  Integumentary Global Assessment Normal Exam - Distribution of scalp and body hair is normal. General Characteristics Overall Skin Surface - no rashes and no suspicious lesions.  Head and Neck Head-normocephalic, atraumatic with no lesions or palpable masses. Face Global Assessment - atraumatic, no absence of expression. Neck Global Assessment - no abnormal movements, no decreased range of  motion. Trachea-midline. Thyroid Gland Characteristics - non-tender.  Eye Eyeball - Left-Extraocular movements intact, No Nystagmus. Eyeball - Right-Extraocular movements intact, No Nystagmus. Upper Eyelid - Left-No Cyanotic. Upper Eyelid - Right-No Cyanotic.  Chest and Lung Exam Inspection Accessory muscles - No use of accessory muscles in breathing.  Male Genitourinary Penis -Note:Giant soft tissue mass on the shaft of the phallus consistent with chronic lymphedema - stable.  Scrotum -Note:small opening in crease between left thigh & scrotum. No drainage consistent with mild hidradenitis.  2 small warts left groin crease - T'x with podophyllin.  Note: Continues to have lipomatous swollen shaft of penis. Maybe less so than before. Still 3 times usual with with prominent foreskin.  Left scrotum with some few small warts. 3 x 2 cm scarring warts around the edge and wound. Consistent with recurrent hydradenitis and condyloma. 2 cm nodule in the left scrotum soft consistent with sebaceous cyst.   Rectal Anorectal Exam External - warts . Note: 4 small warts around a closed circumferential wound. Overall count much better than preop - T'x d with podophyllin. Note: Hygiene okay. Left lateral perianal region dotted with 20 warts. Less on right side. Held off on anoscopy today. No obvious fissure or fistula or abscess.   Peripheral Vascular Upper Extremity Inspection - Left - Not Gangrenous, No Petechiae. Right - Not Gangrenous, No Petechiae.  Neurologic Neurologic evaluation reveals -normal attention span and ability to concentrate, able to name objects and repeat phrases. Appropriate fund of knowledge and normal coordination.  Neuropsychiatric Mental status exam performed with findings of-able to articulate well with normal speech/language, rate, volume and coherence and no evidence of hallucinations, delusions, obsessions or homicidal/suicidal  ideation. Orientation-oriented X3.  Musculoskeletal Global Assessment Gait and Station - normal gait and station.  Lymphatic General Lymphatics Description - No Generalized lymphadenopathy.    Assessment & Plan [name]; 09/13/2014 11:50 AM)  Gar Gibbon ACUMINATA (078.11  A63.0) Story: 2000s s/p ablations Impression: Unfortunately, it looks like he has some recurrences perianally and maybe at left scrotum. Not large volume but a moderate size.  He requires excision of the condyloma in the left scrotal crease and repeat laser ablation on scrotum and perianal region to help remove these final  areas. He is more willing to consider repeat surgery this time  The anatomy & physiology of the anorectal region was discussed. The pathophysiology of anorectal warts and differential diagnosis was discussed. Natural history risks without surgery was discussed such as further growth and cancer. I stressed the importance of office follow-up to catch early recurrence & minimize/halt progression of disease. Interventions such as cauterization by topical agents were discussed.  The patient's symptoms are not adequately controlled by non-operative treatments. I feel the risks & problems of no surgery outweigh the operative risks; therefore, I recommended surgery to treat the anal warts by removal, ablation and/or cauterization.  Risks such as bleeding, infection, need for further treatment, heart attack, death, and other risks were discussed. I noted a good likelihood this will help address the problem. Goals of post-operative recovery were discussed as well. Possibility that this will not correct all symptoms was explained. Post-operative pain, bleeding, constipation, and other problems after surgery were discussed. We will work to minimize complications. Educational handouts further explaining the pathology, treatment options, and bowel regimen were given as well. Questions were answered. The  patient expresses understanding & wishes to proceed with surgery.  Current Plans Schedule for Surgery Pt Education - [facility] Rectal Surgery HCI ([name]): discussed with patient and provided information. Pt Education - [place] ([name]) Pt Education - [facility] Pelvic Floor Exercises ([place]) and Dysfunction HCI ([name]) The anatomy & physiology of the anorectal region was discussed. The pathophysiology of anorectal warts and differential diagnosis was discussed. Natural history risks without surgery was discussed such as further growth and cancer. I stressed the importance of office follow-up to catch early recurrence & minimize/halt progression of disease. Interventions such as cauterization by topical agents were discussed.  The patient's symptoms are not adequately controlled by non-operative treatments. I feel the risks & problems of no surgery outweigh the operative risks; therefore, I recommended surgery to treat the anal warts by removal, ablation and/or cauterization.  Risks such as bleeding, infection, need for further treatment, heart attack, death, and other risks were discussed. I noted a good likelihood this will help address the problem. Goals of post-operative recovery were discussed as well. Possibility that this will not correct all symptoms was explained. Post-operative pain, bleeding, constipation, and other problems after surgery were discussed. We will work to minimize complications. Educational handouts further explaining the pathology, treatment options, and bowel regimen were given as well. Questions were answered. The patient expresses understanding & wishes to proceed with surgery. HIDRADENITIS SUPPURATIVA (705.83  L73.2) Story: s/p wide excisions/skin grafts of perineum in distant past complicated by poor lymphatic drainage & hydrophallus Impression: Wound between left scrotum and inner thigh with ulceration and nodularity suspicious for recurrent condyloma or early cancer  in the setting hydradenitis. This requires excision and allow open granulation. Would also potentially remove cyst on scrotum is well.  He needs to quit smoking to keep these things from coming back.  Current Plans Instructions:  using dry gauze and cotton balls to keep the area clean and dry.  Continue doxycycline for another month.  If not improved, you may benefit from repeat surgery to better drain of even remove the areas. TOBACCO ABUSE (305.1  Z72.0) Impression: I again stressed to him that he has to quit smoking or he will continue to have an healing problems and wound problems and recurrent disease. It would help him a lot to quit smoking to allow these areas to heal and hopefully not come back. He got  the message. I don't know how well motivated he is.  Current Plans Instructions:  STOP SMOKING!  We strongly recommend that you stop smoking. Smoking increases the risk of surgery including infection in the form of an open wound, pus formation, abscess, hernia at an incision on the abdomen, etc. You have an increased risk of other MAJOR complications such as stroke, heart attack, forming clots in the leg and/or lungs, and death.  Smoking Cessation Quitting smoking is important to your health and has many advantages. However, it is not always easy to quit since nicotine is a very addictive drug. Often times, people try 3 times or more before being able to quit. This document explains the best ways for you to prepare to quit smoking. Quitting takes hard work and a lot of effort, but you can do it. ADVANTAGES OF QUITTING SMOKING  You will live longer, feel better, and live better.  Your body will feel the impact of quitting smoking almost immediately.  Within 20 minutes, blood pressure decreases. Your pulse returns to its normal level.  After 8 hours, carbon monoxide levels in the blood return to normal. Your oxygen level increases.  After 24 hours, the chance of having a heart  attack starts to decrease. Your breath, hair, and body stop smelling like smoke.  After 48 hours, damaged nerve endings begin to recover. Your sense of taste and smell improve.  After 72 hours, the body is virtually free of nicotine. Your bronchial tubes relax and breathing becomes easier.  After 2 to 12 weeks, lungs can hold more air. Exercise becomes easier and circulation improves.  The risk of having a heart attack, stroke, cancer, or lung disease is greatly reduced.  After 1 year, the risk of coronary heart disease is cut in half.  After 5 years, the risk of stroke falls to the same as a nonsmoker.  After 10 years, the risk of lung cancer is cut in half and the risk of other cancers decreases significantly.  After 15 years, the risk of coronary heart disease drops, usually to the level of a nonsmoker.  If you are pregnant, quitting smoking will improve your chances of having a healthy baby.  The people you live with, especially any children, will be healthier.  You will have extra money to spend on things other than cigarettes. QUESTIONS TO THINK ABOUT BEFORE ATTEMPTING TO QUIT You may want to talk about your answers with your caregiver.  Why do you want to quit?  If you tried to quit in the past, what helped and what did not?  What will be the most difficult situations for you after you quit? How will you plan to handle them?  Who can help you through the tough times? Your family? Friends? A caregiver?  What pleasures do you get from smoking? What ways can you still get pleasure if you quit? Here are some questions to ask your caregiver:  How can you help me to be successful at quitting?  What medicine do you think would be best for me and how should I take it?  What should I do if I need more help?  What is smoking withdrawal like? How can I get information on withdrawal? GET READY  Set a quit date.  Change your environment by getting rid of all cigarettes,  ashtrays, matches, and lighters in your home, car, or work. Do not let people smoke in your home.  Review your past attempts to quit. Think about what  worked and what did not. GET SUPPORT AND ENCOURAGEMENT You have a better chance of being successful if you have help. You can get support in many ways.  Tell your family, friends, and co-workers that you are going to quit and need their support. Ask them not to smoke around you.  Get individual, group, or telephone counseling and support. Programs are available at [employer] and health centers. Call your local health department for information about programs in your area.  Spiritual beliefs and practices may help some smokers quit.  Download a "quit meter" on your computer to keep track of quit statistics, such as how long you have gone without smoking, cigarettes not smoked, and money saved.  Get a self-help book about quitting smoking and staying off of tobacco. [place] yourself from urges to smoke. Talk to someone, go for a walk, or occupy your time with a task.  Change your normal routine. Take a different route to work. Drink tea instead of coffee. Eat breakfast in a different place.  Reduce your stress. Take a hot bath, exercise, or read a book.  Plan something enjoyable to do every day. Reward yourself for not smoking.  Explore interactive web-based programs that specialize in helping you quit. GET MEDICINE AND USE IT CORRECTLY Medicines can help you stop smoking and decrease the urge to smoke. Combining medicine with the above behavioral methods and support can greatly increase your chances of successfully quitting smoking.  Nicotine replacement therapy helps deliver nicotine to your body without the negative effects and risks of smoking. Nicotine replacement therapy includes nicotine gum, lozenges, inhalers, nasal sprays, and skin patches. Some may be available over-the-counter and others  require a prescription.  Antidepressant medicine helps people abstain from smoking, but how this works is unknown. This medicine is available by prescription.  Nicotinic receptor partial agonist medicine simulates the effect of nicotine in your brain. This medicine is available by prescription. Ask your caregiver for advice about which medicines to use and how to use them based on your health history. Your caregiver will tell you what side effects to look out for if you choose to be on a medicine or therapy. Carefully read the information on the package. Do not use any other product containing nicotine while using a nicotine replacement product. RELAPSE OR DIFFICULT SITUATIONS Most relapses occur within the first 3 months after quitting. Do not be discouraged if you start smoking again. Remember, most people try several times before finally quitting. You may have symptoms of withdrawal because your body is used to nicotine. You may crave cigarettes, be irritable, feel very hungry, cough often, get headaches, or have difficulty concentrating. The withdrawal symptoms are only temporary. They are strongest when you first quit, but they will go away within 10 14 days. To reduce the chances of relapse, try to:  Avoid drinking alcohol. Drinking lowers your chances of successfully quitting.  Reduce the amount of caffeine you consume. Once you quit smoking, the amount of caffeine in your body increases and can give you symptoms, such as a rapid heartbeat, sweating, and anxiety.  Avoid smokers because they can make you want to smoke.  Do not let weight gain distract you. Many smokers will gain weight when they quit, usually less than 10 pounds. Eat a healthy diet and stay active. You can always lose the weight gained after you quit.  Find ways to improve your mood other than smoking. FOR MORE INFORMATION www.smokefree.gov  While it can be one of the most difficult things to do, the [place] community has  programs to help you stop. Consider talking with your primary care physician about options. Also, Smoking Cessation classes are available through the [facility]:  The smoking cessation program is a proven-effective program from the American Lung Association. The program is available for anyone 66 and older who currently smokes. The program lasts for 7 weeks and is 8 sessions. Each class will be approximately 1 1/2 hours. The program is every Tuesday. All classes are 12-1:30pm and same location.  Event Location Information: Location: [place] 2nd Floor Conference Room 2-037; located next to [facility] cross streets: [place] Entrance into the [facility] is adjacent to the [employer] main entrance. The conference room is located on the 2nd floor. Parking Instructions: Visitor parking is adjacent to [employer] main entrance and the [place]   A smoking cessation program is also offered through the [facility]. Register online at ClickDebate.gl or call [phone] for more information. Tobacco cessation counseling is available at [facility]. Call [phone] for a free appointment. Tobacco cessation classes also are available through the [facility] in [place]. For information, call [phone]. The Patient Education Network features videos on tobacco cessation. Please consult your listings in the center of this book to find instructions on how to access this resource. If you want more information, ask your nurse.  [name], M.D., F.A.C.S. Gastrointestinal and Minimally Invasive Surgery [facility] [address], [place], [place] [address] [phone] Main / Paging

## 2014-09-17 ENCOUNTER — Other Ambulatory Visit: Payer: Self-pay | Admitting: Endocrinology

## 2014-09-18 ENCOUNTER — Telehealth: Payer: Self-pay | Admitting: Endocrinology

## 2014-09-18 NOTE — Telephone Encounter (Signed)
Pt scheduled for 10/02/2014 at 230 pm for Medicare Wellness exam.

## 2014-09-18 NOTE — Telephone Encounter (Signed)
Pt returning your call

## 2014-09-21 ENCOUNTER — Encounter: Payer: Self-pay | Admitting: Gastroenterology

## 2014-10-02 ENCOUNTER — Ambulatory Visit
Admission: RE | Admit: 2014-10-02 | Discharge: 2014-10-02 | Disposition: A | Discharge status: Home / Self Care | Payer: Medicare Other | Source: Ambulatory Visit | Attending: Endocrinology | Admitting: Endocrinology

## 2014-10-02 ENCOUNTER — Ambulatory Visit (INDEPENDENT_AMBULATORY_CARE_PROVIDER_SITE_OTHER): Payer: Medicare Other | Admitting: Endocrinology

## 2014-10-02 ENCOUNTER — Encounter: Payer: Self-pay | Admitting: Endocrinology

## 2014-10-02 VITALS — BP 136/74 | HR 94 | Temp 98.3°F | Ht 71.0 in | Wt 207.0 lb

## 2014-10-02 DIAGNOSIS — E109 Type 1 diabetes mellitus without complications: Secondary | ICD-10-CM

## 2014-10-02 DIAGNOSIS — R05 Cough: Secondary | ICD-10-CM

## 2014-10-02 DIAGNOSIS — R059 Cough, unspecified: Secondary | ICD-10-CM

## 2014-10-02 DIAGNOSIS — I10 Essential (primary) hypertension: Secondary | ICD-10-CM | POA: Diagnosis not present

## 2014-10-02 LAB — HEMOGLOBIN A1C: HEMOGLOBIN A1C: 8.4 % — AB (ref 4.6–6.5)

## 2014-10-02 MED ORDER — LOSARTAN POTASSIUM-HCTZ 100-12.5 MG PO TABS: 1.0000 | ORAL_TABLET | Freq: Every day | ORAL | Status: DC

## 2014-10-02 MED ORDER — ATORVASTATIN CALCIUM 20 MG PO TABS: 20.0000 mg | ORAL_TABLET | Freq: Every day | ORAL | Status: DC

## 2014-10-02 MED ORDER — HYDROCODONE-ACETAMINOPHEN 10-325 MG PO TABS: 1.0000 | ORAL_TABLET | ORAL | Status: DC | PRN

## 2014-10-02 NOTE — Patient Instructions (Addendum)
Please increase the insulin to 40 units with breakfast, and 30 units with the evening meal. i have sent a prescription to your pharmacy, to increase the losartan-HCTZ.  i have also sent a prescription to your pharmacy, to change lovastatin to generic lipitor. please consider these measures for your health:  minimize alcohol.  do not use tobacco products.  have a colonoscopy at least every 10 years from age 58.   keep firearms safely stored.  always use seat belts.  have working smoke alarms in your home.  see an eye doctor and dentist regularly.  never drive under the influence of alcohol or drugs (including prescription drugs).  those with fair skin should take precautions against the sun. blood tests are being requested for you today.  We'll let you know about the results. Please come back for a follow-up appointment in 3 months.

## 2014-10-02 NOTE — Progress Notes (Signed)
Subjective:    Patient ID: [name], male    DOB: 03-15-1956, 59 y.o.   MRN: [id number]  HPI Pt returns for f/u of diabetes mellitus: DM type: Insulin-requiring type 2 Dx'ed: 9323 Complications: PAD Therapy: insulin since 2009 DKA: never Severe hypoglycemia: never Pancreatitis: never Other: he has been on a simple BID insulin regimen, after poor results with multiple daily injections; he has requested inexpensive human insulin Interval history: no cbg record, but states cbg's are varyfrom 50-200.  He says it was lowest in am.  Pt says he never misses the insulin, but he takes only 30 units bid.   HTN: was exac by [facility]mill.  Past Medical History  Diagnosis Date  . Diabetic neuropathy   . Smokers' cough   . Productive cough   . GERD (gastroesophageal reflux disease)   . Hyperlipidemia   . Allergic rhinitis   . History of bladder cancer     s/p  turbt  2013/   transitional cell carcinoma--   . Hypertension   . Lower urinary tract symptoms (LUTS)   . Type 2 diabetes mellitus with insulin therapy     monitor by  dr ellsion  . Claudication of calf muscles   . History of gout   . Wears dentures     upper  . At risk for sleep apnea     STOP-BANG= 5   SENT TO PCP 03-14-2014  . Hidradenitis suppurativa     h/p massive perineal involvement in past    Past Surgical History  Procedure Laterality Date  . Laser ablation of penile and perianal warts  07-29-2007  Dr. Johney Maine  . Left shoulder surgery  2003  . Cataract extraction w/ intraocular lens implant Right   . Transurethral resection of bladder tumor  05/21/2012    Procedure: TRANSURETHRAL RESECTION OF BLADDER TUMOR (TURBT);  Surgeon: Claybon Jabs, MD;  Location: [facility];  Service: Urology;  Laterality: N/A;     . Inguinal hidradenitis excision  1998, 1999  . Perineal hidradenitis excision  1998, 1999  . Axillary hidradenitis excision  1997  . Laser ablation condolamata N/A 03/20/2014    Procedure:  EXAM UNDER ANESTHESIA, REMOVAL/ABLATION OF CONDYLOMATA PENIS,GROINS, ANUS, ANAL CANAL;  Surgeon: [name], MD;  Location: [facility];  Service: General;  Laterality: N/A;  groin and anus  . Co2 laser application N/A 5/57/3220    Procedure: CO2 LASER APPLICATION,PENIS, GROIN, ANUS;  Surgeon: [name], MD;  [facility];  Service: General;  Laterality: N/A;    History   Social History  . Marital Status: Married    Spouse Name: N/A  . Number of Children: N/A  . Years of Education: N/A   Occupational History  . Not on file.   Social History Main Topics  . Smoking status: Current Every Day Smoker -- 1.00 packs/day for 40 years    Types: Cigarettes  . Smokeless tobacco: Never Used  . Alcohol Use: No  . Drug Use: No  . Sexual Activity: Not on file   Other Topics Concern  . Not on file   Social History Narrative    Current Outpatient Prescriptions on File Prior to Visit  Medication Sig Dispense Refill  . albuterol (PROVENTIL HFA;VENTOLIN HFA) 108 (90 BASE) MCG/ACT inhaler Inhale 2 puffs into the lungs every 6 (six) hours as needed for wheezing. 1 Inhaler 2  . calcium carbonate (TUMS - DOSED IN MG ELEMENTAL CALCIUM) 500  MG chewable tablet Chew 1 tablet by mouth as needed.     . cilostazol (PLETAL) 100 MG tablet Take 1 tablet (100 mg total) by mouth 2 (two) times daily. 60 tablet 6  . insulin lispro protamine-lispro (HUMALOG 75/25 MIX) (75-25) 100 UNIT/ML SUSP injection Inject into the skin. 40 units with breakfast, and 30 units with the evening meal, and pen needles 2/day     No current facility-administered medications on file prior to visit.    No Known Allergies  Family History  Problem Relation Age of Onset  . Colon cancer Neg Hx   . Esophageal cancer Neg Hx   . Pancreatic cancer Neg Hx   . Prostate cancer Neg Hx   . Kidney disease Neg Hx   . Liver disease Neg Hx   . Lung cancer Neg Hx   . Diabetes Maternal Aunt      x 2  . Hypertension Mother   . Cancer Father     lung ca    BP 136/74 mmHg  Pulse 94  Temp(Src) 98.3 F (36.8 C) (Oral)  Ht 5\' 11"  (1.803 m)  Wt 207 lb (93.895 kg)  BMI 28.88 kg/m2  SpO2 95%   Review of Systems Cough is much less, but not quite resolved.  Denies LOC.    Objective:   Physical Exam VITAL SIGNS:  See vs page GENERAL: no distress.   LUNGS:  Clear to auscultation Pulses: dorsalis pedis absent bilat.  MSK: no deformity of the feet.  CV: no leg edema.   Skin:  no ulcer on the feet.  normal color and temp on the feet.  Neuro: sensation is intact to touch on the feet.     CXR: NAD  Lab Results  Component Value Date   HGBA1C 8.4* 10/02/2014       Assessment & Plan:  HTN: mod exacerbation at treadmill cough, improved: discontinuation of smoking is advised DM: moderate exacerbation.   Side-effect of rx: hypoglycemia.   Subjective:   Patient here for Medicare annual wellness visit and management of other chronic and acute problems.     Risk factors: multiple med probs.    Roster of Physicians Providing Medical Care to Patient:  See "snapshot"   Activities of Daily Living: In your present state of health, do you have any difficulty performing the following activities?:  Preparing food and eating?: No  Bathing yourself: No  Getting dressed: No  Using the toilet:No  Moving around from place to place: No  In the past year have you fallen or had a near fall?: No    Home Safety: Has smoke detector and wears seat belts. Firearms are safely stored. No excess sun exposure.   Diet and Exercise  Current exercise habits: pt says good Dietary issues discussed: pt reports a healthy diet   Depression Screen  Q1: Over the past two weeks, have you felt down, depressed or hopeless?no  Q2: Over the past two weeks, have you felt little interest or pleasure in doing things? no   The following portions of the patient's history were reviewed and updated as  appropriate: allergies, current medications, past family history, past medical history, past social history, past surgical history and problem list.   Review of Systems  Denies hearing loss, and visual loss Objective:   Vision:  Sees opthalmologist Hearing: grossly normal Body mass index:  See vs page Msk: pt easily and quickly performs "get-up-and-go" from a sitting position Cognitive Impairment Assessment: cognition, memory and judgment  appear normal.  remembers 3/3 at 5 minutes.  excellent recall.  can easily read and write a sentence.  alert and oriented x 3.     Assessment:   Medicare wellness utd on preventive parameters.     Plan:   During the course of the visit the patient was educated and counseled about appropriate screening and preventive services including:        Fall prevention    Diabetes screening  Nutrition counseling   Vaccines / LABS Zostavax / Pneumococcal Vaccine  today  PSA  Patient Instructions (the written plan) was given to the patient.

## 2014-10-05 DIAGNOSIS — Z8551 Personal history of malignant neoplasm of bladder: Secondary | ICD-10-CM | POA: Diagnosis not present

## 2014-10-05 DIAGNOSIS — A630 Anogenital (venereal) warts: Secondary | ICD-10-CM | POA: Diagnosis not present

## 2014-10-19 ENCOUNTER — Encounter (HOSPITAL_BASED_OUTPATIENT_CLINIC_OR_DEPARTMENT_OTHER): Payer: Self-pay | Admitting: *Deleted

## 2014-10-19 NOTE — Progress Notes (Addendum)
SPOKE W/ WIFE. NPO AFTER MN WITH EXCEPTION CLEAR LIQUIDS UNTIL 0730 (NO CREAM/ MILK PRODUCTS).  ARRIVE AT 1200. NEEDS ISTAT.  CURRENT EKG IN CHART AND EPIC. WILL TAKE LIPITOR AND PLETAL AM DOS W/ SIPS OF WATER.  PT AND WIFE STATE , BLOOD SUGAR WAS LOW LAST VISIT 03-15-2014 AND WAS TOLD NOT TO TAKE HUMALOG NIGHT BEFORE SURGERY THE NEXT TIME, SO PT IS NOT DOING INSULIN NIGHT BEFORE DOS.  ALSO, PT WILL DO HIBICLENS SHOWER HS BEFORE AND AM DOS.

## 2014-10-24 ENCOUNTER — Encounter (HOSPITAL_BASED_OUTPATIENT_CLINIC_OR_DEPARTMENT_OTHER): Payer: Self-pay | Admitting: *Deleted

## 2014-10-24 ENCOUNTER — Ambulatory Visit (HOSPITAL_BASED_OUTPATIENT_CLINIC_OR_DEPARTMENT_OTHER): Payer: Medicare Other | Admitting: Anesthesiology

## 2014-10-24 ENCOUNTER — Encounter (HOSPITAL_BASED_OUTPATIENT_CLINIC_OR_DEPARTMENT_OTHER)
Admission: RE | Disposition: A | Discharge status: Home / Self Care | Payer: Self-pay | Source: Ambulatory Visit | Attending: Surgery

## 2014-10-24 ENCOUNTER — Ambulatory Visit (HOSPITAL_BASED_OUTPATIENT_CLINIC_OR_DEPARTMENT_OTHER)
Admission: RE | Admit: 2014-10-24 | Discharge: 2014-10-24 | Disposition: A | Discharge status: Home / Self Care | Payer: Medicare Other | Source: Ambulatory Visit | Attending: Surgery | Admitting: Surgery

## 2014-10-24 DIAGNOSIS — J45909 Unspecified asthma, uncomplicated: Secondary | ICD-10-CM | POA: Insufficient documentation

## 2014-10-24 DIAGNOSIS — L98499 Non-pressure chronic ulcer of skin of other sites with unspecified severity: Secondary | ICD-10-CM | POA: Diagnosis not present

## 2014-10-24 DIAGNOSIS — I739 Peripheral vascular disease, unspecified: Secondary | ICD-10-CM | POA: Insufficient documentation

## 2014-10-24 DIAGNOSIS — M109 Gout, unspecified: Secondary | ICD-10-CM | POA: Insufficient documentation

## 2014-10-24 DIAGNOSIS — K219 Gastro-esophageal reflux disease without esophagitis: Secondary | ICD-10-CM | POA: Diagnosis not present

## 2014-10-24 DIAGNOSIS — K649 Unspecified hemorrhoids: Secondary | ICD-10-CM | POA: Diagnosis not present

## 2014-10-24 DIAGNOSIS — Z8551 Personal history of malignant neoplasm of bladder: Secondary | ICD-10-CM | POA: Diagnosis not present

## 2014-10-24 DIAGNOSIS — N508 Other specified disorders of male genital organs: Secondary | ICD-10-CM | POA: Diagnosis not present

## 2014-10-24 DIAGNOSIS — N360 Urethral fistula: Secondary | ICD-10-CM | POA: Diagnosis not present

## 2014-10-24 DIAGNOSIS — E785 Hyperlipidemia, unspecified: Secondary | ICD-10-CM | POA: Insufficient documentation

## 2014-10-24 DIAGNOSIS — E780 Pure hypercholesterolemia: Secondary | ICD-10-CM | POA: Diagnosis not present

## 2014-10-24 DIAGNOSIS — E1142 Type 2 diabetes mellitus with diabetic polyneuropathy: Secondary | ICD-10-CM | POA: Diagnosis not present

## 2014-10-24 DIAGNOSIS — K642 Third degree hemorrhoids: Secondary | ICD-10-CM | POA: Diagnosis not present

## 2014-10-24 DIAGNOSIS — Z794 Long term (current) use of insulin: Secondary | ICD-10-CM | POA: Diagnosis not present

## 2014-10-24 DIAGNOSIS — A630 Anogenital (venereal) warts: Secondary | ICD-10-CM | POA: Insufficient documentation

## 2014-10-24 DIAGNOSIS — E119 Type 2 diabetes mellitus without complications: Secondary | ICD-10-CM | POA: Diagnosis not present

## 2014-10-24 DIAGNOSIS — N492 Inflammatory disorders of scrotum: Secondary | ICD-10-CM | POA: Diagnosis not present

## 2014-10-24 DIAGNOSIS — F17210 Nicotine dependence, cigarettes, uncomplicated: Secondary | ICD-10-CM | POA: Insufficient documentation

## 2014-10-24 DIAGNOSIS — I10 Essential (primary) hypertension: Secondary | ICD-10-CM | POA: Insufficient documentation

## 2014-10-24 DIAGNOSIS — A5131 Condyloma latum: Secondary | ICD-10-CM | POA: Diagnosis not present

## 2014-10-24 HISTORY — PX: LASER ABLATION CONDOLAMATA: SHX5941

## 2014-10-24 HISTORY — DX: Peripheral vascular disease, unspecified: I73.9

## 2014-10-24 HISTORY — PX: INCISION AND DRAINAGE ABSCESS: SHX5864

## 2014-10-24 HISTORY — PX: MASS EXCISION: SHX2000

## 2014-10-24 HISTORY — PX: HEMORRHOID SURGERY: SHX153

## 2014-10-24 LAB — GLUCOSE, CAPILLARY: Glucose-Capillary: 124 mg/dL — ABNORMAL HIGH (ref 70–99)

## 2014-10-24 LAB — POCT I-STAT 4, (NA,K, GLUC, HGB,HCT)
GLUCOSE: 140 mg/dL — AB (ref 70–99)
HCT: 49 % (ref 39.0–52.0)
Hemoglobin: 16.7 g/dL (ref 13.0–17.0)
Potassium: 3.7 mmol/L (ref 3.5–5.1)
Sodium: 142 mmol/L (ref 135–145)

## 2014-10-24 SURGERY — ABLATION, CONDYLOMA, USING LASER
Anesthesia: General | Site: Scrotum

## 2014-10-24 MED ORDER — HYDROCODONE-ACETAMINOPHEN 10-325 MG PO TABS: 1.0000 | ORAL_TABLET | ORAL | Status: DC | PRN

## 2014-10-24 MED ORDER — METRONIDAZOLE IN NACL 5-0.79 MG/ML-% IV SOLN
500.0000 mg | INTRAVENOUS | Status: AC
  Administered 2014-10-24: 500 mg via INTRAVENOUS
  Filled 2014-10-24: qty 100

## 2014-10-24 MED ORDER — EPHEDRINE SULFATE 50 MG/ML IJ SOLN
INTRAMUSCULAR | Status: DC | PRN
  Administered 2014-10-24 (×2): 10 mg via INTRAVENOUS

## 2014-10-24 MED ORDER — FENTANYL CITRATE 0.05 MG/ML IJ SOLN
INTRAMUSCULAR | Status: DC | PRN
  Administered 2014-10-24 (×2): 25 ug via INTRAVENOUS
  Administered 2014-10-24: 50 ug via INTRAVENOUS
  Administered 2014-10-24 (×2): 25 ug via INTRAVENOUS
  Administered 2014-10-24: 50 ug via INTRAVENOUS

## 2014-10-24 MED ORDER — MIDAZOLAM HCL 2 MG/2ML IJ SOLN
INTRAMUSCULAR | Status: AC
  Filled 2014-10-24: qty 2

## 2014-10-24 MED ORDER — METRONIDAZOLE IN NACL 5-0.79 MG/ML-% IV SOLN
INTRAVENOUS | Status: AC
  Filled 2014-10-24: qty 100

## 2014-10-24 MED ORDER — KETOROLAC TROMETHAMINE 30 MG/ML IJ SOLN
30.0000 mg | Freq: Once | INTRAMUSCULAR | Status: DC | PRN
  Filled 2014-10-24: qty 1

## 2014-10-24 MED ORDER — PROPOFOL 10 MG/ML IV BOLUS
INTRAVENOUS | Status: DC | PRN
  Administered 2014-10-24: 200 mg via INTRAVENOUS

## 2014-10-24 MED ORDER — FENTANYL CITRATE 0.05 MG/ML IJ SOLN
INTRAMUSCULAR | Status: AC
  Filled 2014-10-24: qty 6

## 2014-10-24 MED ORDER — KETOROLAC TROMETHAMINE 30 MG/ML IJ SOLN
INTRAMUSCULAR | Status: DC | PRN
  Administered 2014-10-24: 30 mg via INTRAVENOUS

## 2014-10-24 MED ORDER — CHLORHEXIDINE GLUCONATE 4 % EX LIQD
1.0000 "application " | Freq: Once | CUTANEOUS | Status: DC
  Filled 2014-10-24: qty 15

## 2014-10-24 MED ORDER — SODIUM CHLORIDE 0.9 % IJ SOLN
INTRAMUSCULAR | Status: DC | PRN
  Administered 2014-10-24: 20 mL via INTRAVENOUS

## 2014-10-24 MED ORDER — CEFAZOLIN SODIUM-DEXTROSE 2-3 GM-% IV SOLR
2.0000 g | INTRAVENOUS | Status: AC
  Administered 2014-10-24: 2 g via INTRAVENOUS
  Filled 2014-10-24: qty 50

## 2014-10-24 MED ORDER — MIDAZOLAM HCL 5 MG/5ML IJ SOLN
INTRAMUSCULAR | Status: DC | PRN
  Administered 2014-10-24: 2 mg via INTRAVENOUS

## 2014-10-24 MED ORDER — LACTATED RINGERS IV SOLN
INTRAVENOUS | Status: DC
  Administered 2014-10-24: 12:00:00 via INTRAVENOUS
  Filled 2014-10-24: qty 1000

## 2014-10-24 MED ORDER — LIDOCAINE HCL (CARDIAC) 20 MG/ML IV SOLN
INTRAVENOUS | Status: DC | PRN
  Administered 2014-10-24: 60 mg via INTRAVENOUS

## 2014-10-24 MED ORDER — ONDANSETRON HCL 4 MG/2ML IJ SOLN
INTRAMUSCULAR | Status: DC | PRN
  Administered 2014-10-24: 4 mg via INTRAVENOUS

## 2014-10-24 MED ORDER — CEFAZOLIN SODIUM-DEXTROSE 2-3 GM-% IV SOLR
INTRAVENOUS | Status: AC
  Filled 2014-10-24: qty 50

## 2014-10-24 MED ORDER — PROMETHAZINE HCL 25 MG/ML IJ SOLN
6.2500 mg | INTRAMUSCULAR | Status: DC | PRN
  Filled 2014-10-24: qty 1

## 2014-10-24 MED ORDER — BUPIVACAINE LIPOSOME 1.3 % IJ SUSP
INTRAMUSCULAR | Status: DC | PRN
  Administered 2014-10-24: 20 mL

## 2014-10-24 MED ORDER — FENTANYL CITRATE 0.05 MG/ML IJ SOLN
25.0000 ug | INTRAMUSCULAR | Status: DC | PRN
  Filled 2014-10-24: qty 1

## 2014-10-24 SURGICAL SUPPLY — 48 items
APL SKNCLS STERI-STRIP NONHPOA (GAUZE/BANDAGES/DRESSINGS)
BENZOIN TINCTURE PRP APPL 2/3 (GAUZE/BANDAGES/DRESSINGS) ×4 IMPLANT
BLADE SURG 15 STRL LF DISP TIS (BLADE) IMPLANT
BLADE SURG 15 STRL SS (BLADE)
BNDG GAUZE ELAST 4 BULKY (GAUZE/BANDAGES/DRESSINGS) ×2 IMPLANT
BRIEF STRETCH FOR OB PAD LRG (UNDERPADS AND DIAPERS) ×8 IMPLANT
CLEANER CAUTERY TIP 5X5 PAD (MISCELLANEOUS) IMPLANT
COVER MAYO STAND STRL (DRAPES) ×6 IMPLANT
COVER TABLE BACK 60X90 (DRAPES) ×6 IMPLANT
DRAPE PED LAPAROTOMY (DRAPES) ×6 IMPLANT
DRAPE UTILITY XL STRL (DRAPES) ×6 IMPLANT
ELECT NDL BLADE 2-5/6 (NEEDLE) ×4 IMPLANT
ELECT NEEDLE BLADE 2-5/6 (NEEDLE) ×6 IMPLANT
ELECT REM PT RETURN 9FT ADLT (ELECTROSURGICAL) ×6
ELECTRODE REM PT RTRN 9FT ADLT (ELECTROSURGICAL) ×4 IMPLANT
GAUZE PACKING 1/4 X5 YD (GAUZE/BANDAGES/DRESSINGS) ×2 IMPLANT
GAUZE SPONGE 4X4 12PLY STRL LF (GAUZE/BANDAGES/DRESSINGS) ×12 IMPLANT
GLOVE BIOGEL PI IND STRL 8 (GLOVE) ×4 IMPLANT
GLOVE BIOGEL PI INDICATOR 8 (GLOVE) ×2
GLOVE ECLIPSE 8.0 STRL XLNG CF (GLOVE) ×6 IMPLANT
GOWN STRL REUS W/ TWL XL LVL3 (GOWN DISPOSABLE) ×4 IMPLANT
GOWN STRL REUS W/TWL XL LVL3 (GOWN DISPOSABLE) ×12
LEGGING LITHOTOMY PAIR STRL (DRAPES) ×2 IMPLANT
LIQUID BAND (GAUZE/BANDAGES/DRESSINGS) IMPLANT
LOOP VESSEL MAXI BLUE (MISCELLANEOUS) ×2 IMPLANT
NEEDLE HYPO 22GX1.5 SAFETY (NEEDLE) ×2 IMPLANT
PACK BASIN DAY SURGERY FS (CUSTOM PROCEDURE TRAY) ×6 IMPLANT
PAD CLEANER CAUTERY TIP 5X5 (MISCELLANEOUS) ×2
PENCIL BUTTON HOLSTER BLD 10FT (ELECTRODE) ×6 IMPLANT
SHEET MEDIUM DRAPE 40X70 STRL (DRAPES) ×6 IMPLANT
SPONGE GAUZE 4X4 12PLY STER LF (GAUZE/BANDAGES/DRESSINGS) ×2 IMPLANT
SURGILUBE 2OZ TUBE FLIPTOP (MISCELLANEOUS) ×6 IMPLANT
SUT CHROMIC 3 0 SH 27 (SUTURE) ×6 IMPLANT
SUT ETHIBOND 0 (SUTURE) ×2 IMPLANT
SUT VIC AB 2-0 SH 27 (SUTURE) ×12
SUT VIC AB 2-0 SH 27XBRD (SUTURE) IMPLANT
SUT VIC AB 2-0 UR6 27 (SUTURE) ×2 IMPLANT
SUT VIC AB 3-0 SH 18 (SUTURE) IMPLANT
SYR CONTROL 10ML LL (SYRINGE) ×6 IMPLANT
TOWEL OR 17X24 6PK STRL BLUE (TOWEL DISPOSABLE) ×12 IMPLANT
TRAY DSU PREP LF (CUSTOM PROCEDURE TRAY) ×6 IMPLANT
TUBE CONNECTING 12'X1/4 (SUCTIONS) ×1
TUBE CONNECTING 12X1/4 (SUCTIONS) ×5 IMPLANT
UNDERPAD 30X30 INCONTINENT (UNDERPADS AND DIAPERS) ×6 IMPLANT
VACUUM HOSE/TUBING 7/8INX6FT (MISCELLANEOUS) ×6 IMPLANT
WATER STERILE IRR 1000ML POUR (IV SOLUTION) ×6 IMPLANT
WATER STERILE IRR 500ML POUR (IV SOLUTION) ×12 IMPLANT
YANKAUER SUCT BULB TIP NO VENT (SUCTIONS) ×6 IMPLANT

## 2014-10-24 NOTE — Anesthesia Postprocedure Evaluation (Signed)
Anesthesia Post Note  Patient: [name]  Procedure(s) Performed: Procedure(s) (LRB): LASER ABLATION CONDOLAMATA (N/A) EXCISION OF PERINEAL MASS/SINUS (N/A) HEMORRHOIDECTOMY INCISION AND DRAINAGE ABSCESS (Left)  Anesthesia type: general  Patient location: PACU  Post pain: Pain level controlled  Post assessment: Patient's Cardiovascular Status Stable  Last Vitals:  Filed Vitals:   10/24/14 1645  BP: 134/70  Pulse: 87  Temp:   Resp: 19    Post vital signs: Reviewed and stable  Level of consciousness: sedated  Complications: No apparent anesthesia complications

## 2014-10-24 NOTE — Transfer of Care (Signed)
Immediate Anesthesia Transfer of Care Note  Patient: [name]  Procedure(s) Performed: Procedure(s) (LRB): LASER ABLATION CONDOLAMATA (N/A) EXCISION OF RECURRENT LEFT GROIN ULCER AND SCROTAL MASS (N/A)  Patient Location: PACU  Anesthesia Type: General  Level of Consciousness: awake, oriented, sedated and patient cooperative  Airway & Oxygen Therapy: Patient Spontanous Breathing and Patient connected to face mask oxygen  Post-op Assessment: Report given to PACU RN and Post -op Vital signs reviewed and stable  Post vital signs: Reviewed and stable  Complications: No apparent anesthesia complications

## 2014-10-24 NOTE — Discharge Instructions (Signed)
STOP SMOKING!  We strongly recommend that you stop smoking.  Smoking increases the risk of surgery including infection in the form of an open wound, pus formation, abscess, hernia at an incision on the abdomen, etc.  You have an increased risk of other MAJOR complications such as stroke, heart attack, forming clots in the leg and/or lungs, and death.    Smoking Cessation Quitting smoking is important to your health and has many advantages. However, it is not always easy to quit since nicotine is a very addictive drug. Often times, people try 3 times or more before being able to quit. This document explains the best ways for you to prepare to quit smoking. Quitting takes hard work and a lot of effort, but you can do it. ADVANTAGES OF QUITTING SMOKING  You will live longer, feel better, and live better.  Your body will feel the impact of quitting smoking almost immediately.  Within 20 minutes, blood pressure decreases. Your pulse returns to its normal level.  After 8 hours, carbon monoxide levels in the blood return to normal. Your oxygen level increases.  After 24 hours, the chance of having a heart attack starts to decrease. Your breath, hair, and body stop smelling like smoke.  After 48 hours, damaged nerve endings begin to recover. Your sense of taste and smell improve.  After 72 hours, the body is virtually free of nicotine. Your bronchial tubes relax and breathing becomes easier.  After 2 to 12 weeks, lungs can hold more air. Exercise becomes easier and circulation improves.  The risk of having a heart attack, stroke, cancer, or lung disease is greatly reduced.  After 1 year, the risk of coronary heart disease is cut in half.  After 5 years, the risk of stroke falls to the same as a nonsmoker.  After 10 years, the risk of lung cancer is cut in half and the risk of other cancers decreases significantly.  After 15 years, the risk of coronary heart disease drops, usually to the level  of a nonsmoker.  If you are pregnant, quitting smoking will improve your chances of having a healthy baby.  The people you live with, especially any children, will be healthier.  You will have extra money to spend on things other than cigarettes. QUESTIONS TO THINK ABOUT BEFORE ATTEMPTING TO QUIT You may want to talk about your answers with your caregiver.  Why do you want to quit?  If you tried to quit in the past, what helped and what did not?  What will be the most difficult situations for you after you quit? How will you plan to handle them?  Who can help you through the tough times? Your family? Friends? A caregiver?  What pleasures do you get from smoking? What ways can you still get pleasure if you quit? Here are some questions to ask your caregiver:  How can you help me to be successful at quitting?  What medicine do you think would be best for me and how should I take it?  What should I do if I need more help?  What is smoking withdrawal like? How can I get information on withdrawal? GET READY  Set a quit date.  Change your environment by getting rid of all cigarettes, ashtrays, matches, and lighters in your home, car, or work. Do not let people smoke in your home.  Review your past attempts to quit. Think about what worked and what did not. GET SUPPORT AND ENCOURAGEMENT You have a  better chance of being successful if you have help. You can get support in many ways.  Tell your family, friends, and co-workers that you are going to quit and need their support. Ask them not to smoke around you.  Get individual, group, or telephone counseling and support. Programs are available at [employer] and health centers. Call your local health department for information about programs in your area.  Spiritual beliefs and practices may help some smokers quit.  Download a "quit meter" on your computer to keep track of quit statistics, such as how long you have gone without  smoking, cigarettes not smoked, and money saved.  Get a self-help book about quitting smoking and staying off of tobacco. [employer] yourself from urges to smoke. Talk to someone, go for a walk, or occupy your time with a task.  Change your normal routine. Take a different route to work. Drink tea instead of coffee. Eat breakfast in a different place.  Reduce your stress. Take a hot bath, exercise, or read a book.  Plan something enjoyable to do every day. Reward yourself for not smoking.  Explore interactive web-based programs that specialize in helping you quit. GET MEDICINE AND USE IT CORRECTLY Medicines can help you stop smoking and decrease the urge to smoke. Combining medicine with the above behavioral methods and support can greatly increase your chances of successfully quitting smoking.  Nicotine replacement therapy helps deliver nicotine to your body without the negative effects and risks of smoking. Nicotine replacement therapy includes nicotine gum, lozenges, inhalers, nasal sprays, and skin patches. Some may be available over-the-counter and others require a prescription.  Antidepressant medicine helps people abstain from smoking, but how this works is unknown. This medicine is available by prescription.  Nicotinic receptor partial agonist medicine simulates the effect of nicotine in your brain. This medicine is available by prescription. Ask your caregiver for advice about which medicines to use and how to use them based on your health history. Your caregiver will tell you what side effects to look out for if you choose to be on a medicine or therapy. Carefully read the information on the package. Do not use any other product containing nicotine while using a nicotine replacement product.  RELAPSE OR DIFFICULT SITUATIONS Most relapses occur within the first 3 months after quitting. Do not be discouraged if you start smoking again. Remember, most  people try several times before finally quitting. You may have symptoms of withdrawal because your body is used to nicotine. You may crave cigarettes, be irritable, feel very hungry, cough often, get headaches, or have difficulty concentrating. The withdrawal symptoms are only temporary. They are strongest when you first quit, but they will go away within 10 14 days. To reduce the chances of relapse, try to:  Avoid drinking alcohol. Drinking lowers your chances of successfully quitting.  Reduce the amount of caffeine you consume. Once you quit smoking, the amount of caffeine in your body increases and can give you symptoms, such as a rapid heartbeat, sweating, and anxiety.  Avoid smokers because they can make you want to smoke.  Do not let weight gain distract you. Many smokers will gain weight when they quit, usually less than 10 pounds. Eat a healthy diet and stay active. You can always lose the weight gained after you quit.  Find ways to improve your mood other than smoking. FOR MORE INFORMATION  www.smokefree.gov    While it can be one of the  most difficult things to do, the [place] community has programs to help you stop.  Consider talking with your primary care physician about options.  Also, Smoking Cessation classes are available through the [facility]:  The smoking cessation program is a proven-effective program from the American Lung Association. The program is available for anyone 31 and older who currently smokes. The program lasts for 7 weeks and is 8 sessions. Each class will be approximately 1 1/2 hours. The program is every Tuesday.  All classes are 12-1:30pm and same location.  Event Location Information:  Location: [place] 2nd Floor Conference Room 2-037; located next to [facility] cross streets: [place] Entrance into the [facility] is adjacent to the [employer] main entrance.  The conference room is located on the 2nd floor.  Parking Instructions: Visitor parking is adjacent to [employer] main entrance and the [place]    A smoking cessation program is also offered through the [facility]. Register online at [email or web address].[email or web address] or call [phone] for more information.   Tobacco cessation counseling is available at [facility]. Call [phone] for a free appointment.   Tobacco cessation classes also are available through the [facility] in [place]. For information, call [phone].   The Patient Education Network features videos on tobacco cessation. Please consult your listings in the center of this book to find instructions on how to access this resource.   If you want more information, ask your nurse.         ANORECTAL SURGERY:  POST OPERATIVE INSTRUCTIONS  1. Take your usually prescribed home medications unless otherwise directed. 2. DIET: Follow a light bland diet the first 24 hours after arrival home, such as soup, liquids, crackers, etc.  Be sure to include lots of fluids daily.  Avoid fast food or heavy meals as your are more likely to get nauseated.  Eat a low fat the next few days after surgery.   3. PAIN CONTROL: a. Pain is best controlled by a usual combination of three different methods TOGETHER: i. Ice/Heat ii. Over the counter pain medication iii. Prescription pain medication b. Most patients will experience some swelling and discomfort in the anus/rectal area. and incisions.  Ice packs or heat (30-60 minutes up to 6 times a day) will help. Use ice for the first few days to help decrease swelling and bruising, then switch to heat such as warm towels, sitz baths, warm baths, etc to help relax tight/sore spots and speed recovery.  Some people prefer to use ice alone, heat alone, alternating between ice & heat.  Experiment to what works for you.  Swelling and bruising can take several  weeks to resolve.   c. It is helpful to take an over-the-counter pain medication regularly for the first few weeks.  Choose one of the following that works best for you: i. Naproxen (Aleve, etc)  Two 220mg  tabs twice a day ii. Ibuprofen (Advil, etc) Three 200mg  tabs four times a day (every meal & bedtime) iii. Acetaminophen (Tylenol, etc) 500-650mg  four times a day (every meal & bedtime) d. A  prescription for pain medication (such as oxycodone, hydrocodone, etc) should be given to you upon discharge.  Take your pain medication as prescribed.  i. If you are having problems/concerns with the prescription medicine (does not control pain, nausea, vomiting, rash, itching, etc), please call us [phone] to see  if we need to switch you to a different pain medicine that will work better for you and/or control your side effect better. ii. If you need a refill on your pain medication, please contact your pharmacy.  They will contact our office to request authorization. Prescriptions will not be filled after 5 pm or on week-ends.  Use a Sitz Bath 4-8 times a day for relief A sitz bath is a warm water bath taken in the sitting position that covers only the hips and buttocks. It may be used for either healing or hygiene purposes. Sitz baths are also used to relieve pain, itching, or muscle spasms. The water may contain medicine. Moist heat will help you heal and relax.  HOME CARE INSTRUCTIONS  Take 3 to 4 sitz baths a day.  Fill the bathtub half full with warm water.  Sit in the water and open the drain a little.  Turn on the warm water to keep the tub half full. Keep the water running constantly.  Soak in the water for 15 to 20 minutes.  After the sitz bath, pat the affected area dry first. SEEK MEDICAL CARE IF:  You get worse instead of better. Stop the sitz baths if you get worse.   4. KEEP YOUR BOWELS REGULAR a. The goal is one bowel movement a day b. Avoid getting constipated.  Between the  surgery and the pain medications, it is common to experience some constipation.  Increasing fluid intake and taking a fiber supplement (such as Metamucil, Citrucel, FiberCon, MiraLax, etc) 1-2 times a day regularly will usually help prevent this problem from occurring.  A mild laxative (prune juice, Milk of Magnesia, MiraLax, etc) should be taken according to package directions if there are no bowel movements after 48 hours. c. Watch out for diarrhea.  If you have many loose bowel movements, simplify your diet to bland foods & liquids for a few days.  Stop any stool softeners and decrease your fiber supplement.  Switching to mild anti-diarrheal medications (Kayopectate, Pepto Bismol) can help.  If this worsens or does not improve, please call us.  5. Wound Care a. Remove your bandages the day after surgery.  Unless discharge instructions indicate otherwise, leave your bandage dry and in place overnight.  Remove the bandage during your first bowel movement.   b. Allow the wound packing to fall out over the next few days.  You can trim exposed gauze / ribbon as it falls out.  You do not need to repack the wound unless instructed otherwise.  Wear an absorbent pad or soft cotton gauze in your underwear as needed to catch any drainage and help keep the area  c. Keep the area clean and dry.  Bathe / shower every day.  Keep the area clean by showering / bathing over the incision / wound.   It is okay to soak an open wound to help wash it.  Wet wipes or showers / gentle washing after bowel movements is often less traumatic than regular toilet paper. d. Dennis Bast may have some styrofoam-like soft packing in the rectum which will come out with the first bowel movement.  e. You will often notice bleeding with bowel movements.  This should slow down by the end of the first week of surgery f. Expect some drainage.  This should slow down, too, by the end of the first week of surgery.  Wear an absorbent pad or soft cotton gauze  in your underwear until the drainage stops.  6. ACTIVITIES as tolerated:   a. You may resume regular (light) daily activities beginning the next day--such as daily self-care, walking, climbing stairs--gradually increasing activities as tolerated.  If you can walk 30 minutes without difficulty, it is safe to try more intense activity such as jogging, treadmill, bicycling, low-impact aerobics, swimming, etc. b. Save the most intensive and strenuous activity for last such as sit-ups, heavy lifting, contact sports, etc  Refrain from any heavy lifting or straining until you are off narcotics for pain control.   c. DO NOT PUSH THROUGH PAIN.  Let pain be your guide: If it hurts to do something, don't do it.  Pain is your body warning you to avoid that activity for another week until the pain goes down. d. You may drive when you are no longer taking prescription pain medication, you can comfortably sit for long periods of time, and you can safely maneuver your car and apply brakes. e. Dennis Bast may have sexual intercourse when it is comfortable.  7. FOLLOW UP in our office a. Please call [facility] at [phone] to set up an appointment to see your surgeon in the office for a follow-up appointment approximately 2 weeks after your surgery. b. Make sure that you call for this appointment the day you arrive home to insure a convenient appointment time. 10. IF YOU HAVE DISABILITY OR FAMILY LEAVE FORMS, BRING THEM TO THE OFFICE FOR PROCESSING.  DO NOT GIVE THEM TO YOUR DOCTOR.        WHEN TO CALL us [phone]: 1. Poor pain control 2. Reactions / problems with new medications (rash/itching, nausea, etc)  3. Fever over 101.5 F (38.5 C) 4. Inability to urinate 5. Nausea and/or vomiting 6. Worsening swelling or bruising 7. Continued bleeding from incision. 8. Increased pain, redness, or drainage from the incision  The clinic staff is available to answer your questions during regular business hours  (8:30am-5pm).  Please dont hesitate to call and ask to speak to one of our nurses for clinical concerns.   A surgeon from [facility] is always on call at the hospitals   If you have a medical emergency, go to the nearest emergency room or call 911.    [facility], [place], [place], [place], [place]  [address] ? MAIN: [phone] ? TOLL FREE: [phone] ? FAX [phone] [email or web address]   Recommended surgeon followup physical exam schedule for condyloma/warts: -every 6 months until negative exam x2, then -every Year until negative exam x2, then -as needed thereafter   Genital Warts Genital warts are a sexually transmitted infection. They may appear as small bumps on the tissues of the genital area. CAUSES  Genital warts are caused by a virus called human papillomavirus (HPV). HPV is the most common sexually transmitted disease (STD) and infection of the sex organs. This infection is spread by having unprotected sex with an infected person. It can be spread by vaginal, anal, and oral sex. Many people do not know they are infected. They may be infected for years without problems. However, even if they do not have problems, they can unknowingly pass the infection to their sexual partners. SYMPTOMS   Itching and irritation in the genital area.Anal wart   Warts that bleed.  Painful sexual intercourse. DIAGNOSIS  Warts are usually recognized with the naked eye on the vagina, vulva, perineum, anus, and rectum. Certain tests can also diagnose genital warts, such as:  A Pap test.  A tissue  sample (biopsy) exam.  Colposcopy. A magnifying tool is used to examine the vagina and cervix. The HPV cells will change color when certain solutions are used. TREATMENT  Warts can be removed by:  Applying certain chemicals, such as cantharidin or podophyllin.  Liquid nitrogen freezing (cryotherapy).  Immunotherapy with candida  or trichophyton injections.  Laser treatment.  Burning with an electrified probe (electrocautery).  Interferon injections.  Surgery. PREVENTION  HPV vaccination can help prevent HPV infections that cause genital warts and that cause cancer of the cervix. It is recommended that the vaccination be given to people between the ages 96 to 1 years old. The vaccine might not work as well or might not work at all if you already have HPV. It should not be given to pregnant women. HOME CARE INSTRUCTIONS   It is important to follow your caregiver's instructions. The warts will not go away without treatment. Repeat treatments are often needed to get rid of warts. Even after it appears that the warts are gone, the normal tissue underneath often remains infected.  Do not try to treat genital warts with medicine used to treat hand warts. This type of medicine is strong and can burn the skin in the genital area, causing more damage.  Tell your past and current sexual partner(s) that you have genital warts. They may be infected also and need treatment.  Avoid sexual contact while being treated.  Do not touch or scratch the warts. The infection may spread to other parts of your body.  Women with genital warts should have a cervical cancer check (Pap test) at least once a year. This type of cancer is slow-growing and can be cured if found early. Chances of developing cervical cancer are increased with HPV.  Inform your obstetrician about your warts in the event of pregnancy. This virus can be passed to the baby's respiratory tract. Discuss this with your caregiver.  Use a condom during sexual intercourse. Following treatment, the use of condoms will help prevent reinfection.  Ask your caregiver about using over-the-counter anti-itch creams. SEEK MEDICAL CARE IF:   Your treated skin becomes red, swollen, or painful.  You have a fever.  You feel generally ill.  You feel little lumps in and around  your genital area.  You are bleeding or have painful sexual intercourse. MAKE SURE YOU:   Understand these instructions.  Will watch your condition.  Will get help right away if you are not doing well or get worse.   GETTING TO GOOD BOWEL HEALTH. Irregular bowel habits such as constipation and diarrhea can lead to many problems over time.  Having one soft bowel movement a day is the most important way to prevent further problems.  The anorectal canal is designed to handle stretching and feces to safely manage our ability to get rid of solid waste (feces, poop, stool) out of our body.  BUT, hard constipated stools can act like ripping concrete bricks and diarrhea can be a burning fire to this very sensitive area of our body, causing inflamed hemorrhoids, anal fissures, increasing risk is perirectal abscesses, abdominal pain/bloating, an making irritable bowel worse.     The goal: ONE SOFT BOWEL MOVEMENT A DAY!  To have soft, regular bowel movements:  Drink at least 8 tall glasses of water a day.   Take plenty of fiber.  Fiber is the undigested part of plant food that passes into the colon, acting s natures broom to encourage bowel motility and movement.  Fiber  can absorb and hold large amounts of water. This results in a larger, bulkier stool, which is soft and easier to pass. Work gradually over several weeks up to 6 servings a day of fiber (25g a day even more if needed) in the form of: Vegetables -- Root (potatoes, carrots, turnips), leafy green (lettuce, salad greens, celery, spinach), or cooked high residue (cabbage, broccoli, etc) Fruit -- Fresh (unpeeled skin & pulp), Dried (prunes, apricots, cherries, etc ),  or stewed ( applesauce)  Whole grain breads, pasta, etc (whole wheat)  Bran cereals  Bulking Agents -- This type of water-retaining fiber generally is easily obtained each day by one of the following:  Psyllium bran -- The psyllium plant is remarkable because its ground seeds can  retain so much water. This product is available as Metamucil, Konsyl, Effersyllium, Per Diem Fiber, or the less expensive generic preparation in drug and health food stores. Although labeled a laxative, it really is not a laxative.  Methylcellulose -- This is another fiber derived from wood which also retains water. It is available as Citrucel. Polyethylene Glycol - and artificial fiber commonly called Miralax or Glycolax.  It is helpful for people with gassy or bloated feelings with regular fiber Flax Seed - a less gassy fiber than psyllium No reading or other relaxing activity while on the toilet. If bowel movements take longer than 5 minutes, you are too constipated AVOID CONSTIPATION.  High fiber and water intake usually takes care of this.  Sometimes a laxative is needed to stimulate more frequent bowel movements, but  Laxatives are not a good long-term solution as it can wear the colon out. Osmotics (Milk of Magnesia, Fleets phosphosoda, Magnesium citrate, MiraLax, GoLytely) are safer than  Stimulants (Senokot, Castor Oil, Dulcolax, Ex Lax)    Do not take laxatives for more than 7days in a row.  IF SEVERELY CONSTIPATED, try a Bowel Retraining Program: Do not use laxatives.  Eat a diet high in roughage, such as bran cereals and leafy vegetables.  Drink six (6) ounces of prune or apricot juice each morning.  Eat two (2) large servings of stewed fruit each day.  Take one (1) heaping tablespoon of a psyllium-based bulking agent twice a day. Use sugar-free sweetener when possible to avoid excessive calories.  Eat a normal breakfast.  Set aside 15 minutes after breakfast to sit on the toilet, but do not strain to have a bowel movement.  If you do not have a bowel movement by the third day, use an enema and repeat the above steps.  Controlling diarrhea Switch to liquids and simpler foods for a few days to avoid stressing your intestines further. Avoid dairy products (especially milk & ice  cream) for a short time.  The intestines often can lose the ability to digest lactose when stressed. Avoid foods that cause gassiness or bloating.  Typical foods include beans and other legumes, cabbage, broccoli, and dairy foods.  Every person has some sensitivity to other foods, so listen to our body and avoid those foods that trigger problems for you. Adding fiber (Citrucel, Metamucil, psyllium, Miralax) gradually can help thicken stools by absorbing excess fluid and retrain the intestines to act more normally.  Slowly increase the dose over a few weeks.  Too much fiber too soon can backfire and cause cramping & bloating. Probiotics (such as active yogurt, Align, etc) may help repopulate the intestines and colon with normal bacteria and calm down a sensitive digestive tract.  Most studies show it  to be of mild help, though, and such products can be costly. Medicines: Bismuth subsalicylate (ex. Kayopectate, Pepto Bismol) every 30 minutes for up to 6 doses can help control diarrhea.  Avoid if pregnant. Loperamide (Immodium) can slow down diarrhea.  Start with two tablets (4mg  total) first and then try one tablet every 6 hours.  Avoid if you are having fevers or severe pain.  If you are not better or start feeling worse, stop all medicines and call your doctor for advice Call your doctor if you are getting worse or not better.  Sometimes further testing (cultures, endoscopy, X-ray studies, bloodwork, etc) may be needed to help diagnose and treat the cause of the diarrhea.  Managing Pain  Pain after surgery or related to activity is often due to strain/injury to muscle, tendon, nerves and/or incisions.  This pain is usually short-term and will improve in a few months.   Many people find it helpful to do the following things TOGETHER to help speed the process of healing and to get back to regular activity more quickly:  1. Avoid heavy physical activity a.  no lifting greater than 20 pounds b. Do not  push through the pain.  Listen to your body and avoid positions and maneuvers than reproduce the pain c. Walking is okay as tolerated, but go slowly and stop when getting sore.  d. Remember: If it hurts to do it, then dont do it! 2. Take Anti-inflammatory medication  a. Take with food/snack around the clock for 1-2 weeks i. This helps the muscle and nerve tissues become less irritable and calm down faster b. Choose ONE of the following over-the-counter medications: i. Naproxen 220mg  tabs (ex. Aleve) 1-2 pills twice a day  ii. Ibuprofen 200mg  tabs (ex. Advil, Motrin) 3-4 pills with every meal and just before bedtime iii. Acetaminophen 500mg  tabs (Tylenol) 1-2 pills with every meal and just before bedtime 3. Use a Heating pad or Ice/Cold Pack a. 4-6 times a day b. May use warm bath/hottub  or showers 4. Try Gentle Massage and/or Stretching  a. at the area of pain many times a day b. stop if you feel pain - do not overdo it  Try these steps together to help you body heal faster and avoid making things get worse.  Doing just one of these things may not be enough.    If you are not getting better after two weeks or are noticing you are getting worse, contact our office for further advice; we may need to re-evaluate you & see what other things we can do to help.     Post Anesthesia Home Care Instructions  Activity: Get plenty of rest for the remainder of the day. A responsible adult should stay with you for 24 hours following the procedure.  For the next 24 hours, DO NOT: -Drive a car -Paediatric nurse -Drink alcoholic beverages -Take any medication unless instructed by your physician -Make any legal decisions or sign important papers.  Meals: Start with liquid foods such as gelatin or soup. Progress to regular foods as tolerated. Avoid greasy, spicy, heavy foods. If nausea and/or vomiting occur, drink only clear liquids until the nausea and/or vomiting subsides. Call your physician  if vomiting continues.  Special Instructions/Symptoms: Your throat may feel dry or sore from the anesthesia or the breathing tube placed in your throat during surgery. If this causes discomfort, gargle with warm salt water. The discomfort should disappear within 24 hours.

## 2014-10-24 NOTE — Op Note (Signed)
10/24/2014  4:31 PM  PATIENT:  [name]  59 y.o. male  Patient Care Team: [name], MD as PCP - General Inda Castle, MD as Consulting Physician (Gastroenterology) Kathie Rhodes, MD as Consulting Physician (Urology) Wellington Hampshire, MD as Consulting Physician (Cardiology) Michael Boston, MD as Consulting Physician (General Surgery)  PRE-OPERATIVE DIAGNOSIS:  Condyloma perianal region, scrotal mass, groin ulcer  POST-OPERATIVE DIAGNOSIS:    Condyloma perianal/perineal region Scrotal abscess  Perineal sinus with h/o hidradenitis suppurativa Internal hemorrhoids with intermittent and complete prolapse  PROCEDURE:  Procedure(s): LASER ABLATION CONDOLAMATA EXCISION OF PERINEAL MASS/SINUS INTERNAL HEMORRHOIDECTOMY X 2 Examination under anesthesia Internal hemorrhoidal ligation and pexy X1  SURGEON:  Surgeon(s): Michael Boston, MD  ANESTHESIA:    General anesthesia. Anorectal block using liposomal bupivacaine  EBL:  Total I/O In: 900 [I.V.:900] Out: -   Delay start of Pharmacological VTE agent (>24hrs) due to surgical blood loss or risk of bleeding:  NO  DRAINS: NONE  SPECIMEN:  Source of Specimen:  1.  Left groin/perineal sinus with condyloma.  #2 internal hemorrhoids  DISPOSITION OF SPECIMEN:  PATHOLOGY  COUNTS:  YES  PLAN OF CARE: Discharge home after PACU  PATIENT DISPOSITION:  PACU - hemodynamically stable.  INDICATION: Pleasant patient with struggles with hemorrhoids.  Smoking male.  History of condyloma status post ablation and excision the OR.  Some recurrences well.  History of hiIdradenitis suppurativa with numerous wide excisions and skin grafts in the distant past.  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  Urology has elected to not treat at this time.  He has recurrent left groin and scrotal infections.  Still does not quit smoking yet.  Not able to be managed in the office despite an improved bowel regimen.  I recommended  examination under anesthesia and surgical treatment:  The anatomy & physiology of the anorectal region was discussed.  The pathophysiology of hemorrhoids and differential diagnosis was discussed.  Natural history risks without surgery was discussed.   I stressed the importance of a bowel regimen to have daily soft bowel movements to minimize progression of disease.  Interventions such as sclerotherapy & banding were discussed.  The patient's symptoms are not adequately controlled by medicines and other non-operative treatments.  I feel the risks & problems of no surgery outweigh the operative risks; therefore, I recommended surgery to treat the hemorrhoids by ligation, pexy, and possible resection.  Risks such as bleeding, infection, need for further treatment, heart attack, death, and other risks were discussed.   I noted a good likelihood this will help address the problem.  Goals of post-operative recovery were discussed as well.  Possibility that this will not correct all symptoms was explained.  Post-operative pain, bleeding, constipation, urinary difficulties, and other problems after surgery were discussed.  We will work to minimize complications.   Educational handouts further explaining the pathology, treatment options, and bowel regimen were given as well.  Questions were answered.  The patient expresses understanding & wishes to proceed with surgery.  The anatomy & physiology of the anorectal region was discussed.  The pathophysiology of anorectal warts and differential diagnosis was discussed.  Natural history risks without surgery was discussed such as further growth and cancer.   I stressed the importance of office follow-up to catch early recurrence & minimize/halt progression of disease.  Interventions such as cauterization by topical agents were discussed.  The patient's symptoms are not adequately controlled by non-operative treatments.  I feel the risks & problems of  no surgery outweigh  the operative risks; therefore, I recommended surgery to treat the anal warts by removal, ablation and/or cauterization.  Risks such as bleeding, infection, need for further treatment, heart attack, death, and other risks were discussed.   I noted a good likelihood this will help address the problem. Goals of post-operative recovery were discussed as well.  Possibility that this will not correct all symptoms was explained.  Post-operative pain, bleeding, constipation, and other problems after surgery were discussed.  We will work to minimize complications.   Educational handouts further explaining the pathology, treatment options, and bowel regimen were given as well.  Questions were answered.  The patient expresses understanding & wishes to proceed with surgery.   OR FINDINGS:   3 cm scrotal abscess.  Purulent sent for culture.  1 x 1 x 2 cm chronic left perineal sinus with warts at crease between left scrotum and inner thigh.  Excised and left open.  Internal hemorrhoids right anterior and posterior greater than left lateral.  Right-sided partially prolapsing.  Recurrent perianal condylomata especially in the left posterior perianal region.  None in anal canal.  DESCRIPTION:   Informed consent was confirmed. Patient underwent general anesthesia without difficulty. Patient was placed into prone positioning.  The perianal region was prepped and draped in sterile fashion. Surgical time-out confirmed our plan.  I did digital rectal examination and then transitioned over to anoscopy to get a sense of the anatomy.  Findings noted above.   I decided to excise the mass at the left scrotal/groin crease.  Did a longitudinal elliptical incision mass excised.  2 x 2 centimeter room.  Landed up extending it more superiorly and posteriorly and more widely.  This lateral nice wide-open flat wound so would not re-fistulized.  Token 18-gauge needle through the most fluctuant area of the left scrotal mass.   Aspirated centimeter of pus.  Incised into it and bluntly opened up the mass and debrided it.  I connected between the perineal wound in the scrotum with a umbilical tape later transitioned over to a blue vascular vessel tape.  Left it as a seton to leave the wounds open.  Seton tied loosely with Ethibond suture.  Next I turned attention to the perianal region.  He had obvious chronically prolapsed hemorrhoids on the right anterior posterior aspects.  I excised the external aspect of these hemorrhoids radially and came up into the hemorrhoid piles internally.  I used a 2-0 Vicryl suture on a UR-6 needle in a figure-of-eight fashion 6 cm proximal to the anal verge. I then ran that stitch longitudinally more distally to close the hemorrhoidectomy wounds, leaving the last 5 mm open to allow natural drainage. I then tied that stitch down to cause a hemorrhoidopexy. I did that for the right anterior and posterior piles especially.  I did ligation of the left lateral pile with some pexing as well with another 2-0 Vicryl suture.  I redid anoscopy.   At completion of this, all hemorrhoidal tissue remaining was reduced into the rectum.  There is no prolapse.   I then used a CO2 laser to ablate some condyloma in the left inner thigh, base of scrotum, and perianal region.  Anoscopy revealed no anal canal warts or masses.  Hemostasis was good.  I did acetic acid staining and saw no other abnormalities.  External anatomy looked okay.  He does have some chronic thickening and scarring but no major stricturing anywhere.  I repeated anoscopy and examination.  Hemostasis  was good.  Patient is being extubated go to go to the recovery room.  I had discussed postop care in detail with the patient in the preop holding area.  Instructions for post-operative recovery and prescriptions are written.  I did with his wife.  STOP SMOKING! We talked to the patient & his wifeabout the dangers of smoking.  We stressed that tobacco use  dramatically increases the risk of peri-operative complications such as infection, tissue necrosis leaving to problems with incision/wound and organ healing, hernia, chronic pain, heart attack, stroke, DVT, pulmonary embolism, and death.  We noted there are programs in our community to help stop smoking.  Information was available.   [name], M.D., F.A.C.S. Gastrointestinal and Minimally Invasive Surgery [facility] [address] [place], [place] [address] [phone] Main / Paging

## 2014-10-24 NOTE — H&P (Signed)
Mora Appl. Alveta Heimlich  Location: [facility] Patient #: [id number] DOB: 08-06-1956 Married / Language: English / Race: White Male  History of Present Illness [name]; 09/13/2014 11:53 AM) Patient words: recheck hidradenitis.  The patient is a 59 year old male presenting for a post-operative visit. KEVORK JOYCE returns for surgical re-evaluation. He feels like he is doing okay. He continues to smoke. He is a diabetic -working on his control. Had increased blood pressure. Tells me struggle to get some of his medications sometimes. He is HIV negative. He does note h his opening between his left scrotum and inner thigh has gotten bigger. Intermittent drainage. Feels lump on his scrotum that occasionally he pops to drain. Perianal pain gone. No constipation. No fevers or chills. Energy level OK. Notes a few warts on his inner thigh. 10-07-1955 [id number] Patient Care Team: [name], MD as PCP - General Inda Castle, MD as Consulting Physician (Gastroenterology) Claybon Jabs, MD as Consulting Physician (Urology) Procedure (Date: 03/20/2014): POST-OPERATIVE DIAGNOSIS: condolamata acuminatum of penis, groins, perianal region PROCEDURE: Procedure(s): EXAM UNDER ANESTHESIA, REMOVAL/ABLATION OF CONDYLOMATA PENIS,GROINS, ANUS, ANAL CANAL SURGEON: Surgeon(s): [name], MD Claybon Jabs, MD ANESTHESIA: local and general Diagnosis 1. Condyloma, left groin - CONDYLOMA ACUMINATUM 2. 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) s/p wide excisions/skin grafts of perineum in distant past complicated by poor lymphatic drainage & hydrophallus  Other Problems Flossie Buffy, RN; 09/13/2014 11:03 AM) Cancer Bladder Problems  Past Surgical History Flossie Buffy, RN; 09/13/2014 11:03 AM) Gallbladder Surgery - Open ABLATION, CONDYLOMA, USING CO2 LASER (54057)03/20/2014  Diagnostic Studies History Flossie Buffy, RN; 09/13/2014 11:03 AM) Colonoscopy 1-5 years ago  Allergies Flossie Buffy, RN; 09/13/2014 11:03 AM) No Known Drug Allergies09/05/2014  Medication History Flossie Buffy, RN; 09/13/2014 11:03 AM) Albuterol Sulfate HFA (108 (90 Base)MCG/ACT Aerosol Soln, Inhalation) Active. Norco (10-325MG  Tablet, Oral) Active. NovoLIN 70/30 ReliOn ((70-30) 100UNIT/ML Suspension, Subcutaneous) Active. Hyzaar (50-12.5MG  Tablet, Oral) Active. Lovastatin (20MG  Tablet, Oral) Active. Medications Reconciled  Social History Flossie Buffy, RN; 09/13/2014 11:03 AM) Caffeine use Coffee. Illicit drug use Prefer to discuss with provider. Alcohol use Remotely quit alcohol use. Tobacco use Current every day smoker.  Family History Flossie Buffy, RN; 09/13/2014 11:03 AM) Alcohol Abuse Father, Mother. Hypertension Mother. Melanoma Father.  Review of Systems [name], MD; 09/13/2014 12:00 00) General Not Present- Appetite Loss, Chills, Fatigue, Fever, Night Sweats, Weight Gain and Weight Loss. Skin Present- Change in Wart/Mole. Not Present- Dryness, Hives, Jaundice, New Lesions, Non-Healing Wounds, Rash and Ulcer. HEENT Not Present- Earache, Hearing Loss, Hoarseness, Nose Bleed, Oral Ulcers, Ringing in the Ears, Seasonal Allergies, Sinus Pain, Sore Throat, Visual Disturbances, Wears glasses/contact lenses and Yellow Eyes. Respiratory Not Present- Bloody sputum, Chronic Cough, Difficulty Breathing, Snoring and Wheezing. Breast Not Present- Breast Mass, Breast Pain, Nipple Discharge and Skin Changes. Cardiovascular Not Present- Chest Pain, Difficulty Breathing Lying Down, Leg Cramps, Palpitations, Rapid Heart Rate, Shortness of Breath and  Swelling of Extremities. Gastrointestinal Not Present- Abdominal Pain, Bloating, Bloody Stool, Change in Bowel Habits, Chronic diarrhea, Constipation, Difficulty Swallowing, Excessive gas, Gets full quickly at meals, Hemorrhoids, Indigestion, Nausea, Rectal Pain and Vomiting. Male Genitourinary Not Present- Blood in Urine, Change in Urinary Stream, Frequency,  Impotence, Nocturia, Painful Urination, Urgency and Urine Leakage. Musculoskeletal Not Present- Back Pain, Joint Pain, Joint Stiffness, Muscle Pain, Muscle Weakness and Swelling of Extremities. Neurological Not Present- Decreased Memory, Fainting, Headaches, Numbness, Seizures, Tingling, Tremor, Trouble walking and Weakness. Psychiatric Not Present- Anxiety, Bipolar, Change in Sleep Pattern, Depression, Fearful and Frequent crying. Endocrine Not Present- Cold Intolerance, Excessive Hunger, Hair Changes, Heat Intolerance and New Diabetes. Hematology Not Present- Easy Bruising, Excessive bleeding, Gland problems, HIV and Persistent Infections.   Vitals Delilah Shan Moffitt RN; 09/13/2014 11:03 AM) 09/13/2014 11:02 AM Weight: 208.2 lb Height: 71in Body Surface Area: 2.18 m Body Mass Index: 29.04 kg/m Temp.: 30F(Oral)  Pulse: 74 (Regular)  Resp.: 20 (Unlabored)  BP: 154/80 (Sitting, Left Arm, Standard)    Physical Exam [name]; 09/13/2014 11:52 AM) General Mental Status-Alert. General Appearance-Not in acute distress. Voice-Normal.  Integumentary Global Assessment Normal Exam - Distribution of scalp and body hair is normal. General Characteristics Overall Skin Surface - no rashes and no suspicious lesions.  Head and Neck Head-normocephalic, atraumatic with no lesions or palpable masses. Face Global Assessment - atraumatic, no absence of expression. Neck Global Assessment - no abnormal movements, no decreased range of motion. Trachea-midline. Thyroid Gland Characteristics -  non-tender.  Eye Eyeball - Left-Extraocular movements intact, No Nystagmus. Eyeball - Right-Extraocular movements intact, No Nystagmus. Upper Eyelid - Left-No Cyanotic. Upper Eyelid - Right-No Cyanotic.  Chest and Lung Exam Inspection Accessory muscles - No use of accessory muscles in breathing.  Male Genitourinary Penis -Note: Giant soft tissue mass on the shaft of the phallus consistent with chronic lymphedema - stable.  Scrotum -Note: small opening in crease between left thigh & scrotum. No drainage consistent with mild hidradenitis.  2 small warts left groin crease - T'x with podophyllin.  Note: Continues to have lipomatous swollen shaft of penis. Maybe less so than before. Still 3 times usual with with prominent foreskin.  Left scrotum with some few small warts. 3 x 2 cm scarring warts around the edge and wound. Consistent with recurrent hydradenitis and condyloma. 2 cm nodule in the left scrotum soft consistent with sebaceous cyst.   Rectal Anorectal Exam External - warts . Note: 4 small warts around a closed circumferential wound. Overall count much better than preop - T'x d with podophyllin. Note: Hygiene okay. Left lateral perianal region dotted with 20 warts. Less on right side. Held off on anoscopy today. No obvious fissure or fistula or abscess.   Peripheral Vascular Upper Extremity Inspection - Left - Not Gangrenous, No Petechiae. Right - Not Gangrenous, No Petechiae.  Neurologic Neurologic evaluation reveals -normal attention span and ability to concentrate, able to name objects and repeat phrases. Appropriate fund of knowledge and normal coordination.  Neuropsychiatric Mental status exam performed with findings of-able to articulate well with normal speech/language, rate, volume and coherence and no evidence of hallucinations, delusions, obsessions or homicidal/suicidal ideation. Orientation-oriented X3.  Musculoskeletal Global  Assessment Gait and Station - normal gait and station.  Lymphatic General Lymphatics Description - No Generalized lymphadenopathy.    Assessment & Plan [name]; 09/13/2014 11:50 AM) Gar Gibbon ACUMINATA (078.11  A63.0)  Impression: Unfortunately, it looks like he has some recurrences perianally and maybe at left scrotum. Not large volume but a moderate size.  He requires excision of the condyloma in the left scrotal crease and repeat laser ablation on scrotum and perianal region to help remove these final areas. He is more willing to consider repeat surgery this time  The  anatomy & physiology of the anorectal region was discussed. The pathophysiology of anorectal warts and differential diagnosis was discussed. Natural history risks without surgery was discussed such as further growth and cancer. I stressed the importance of office follow-up to catch early recurrence & minimize/halt progression of disease. Interventions such as cauterization by topical agents were discussed.  The patient's symptoms are not adequately controlled by non-operative treatments. I feel the risks & problems of no surgery outweigh the operative risks; therefore, I recommended surgery to treat the anal warts by removal, ablation and/or cauterization.  Risks such as bleeding, infection, need for further treatment, heart attack, death, and other risks were discussed. I noted a good likelihood this will help address the problem. Goals of post-operative recovery were discussed as well. Possibility that this will not correct all symptoms was explained. Post-operative pain, bleeding, constipation, and other problems after surgery were discussed. We will work to minimize complications. Educational handouts further explaining the pathology, treatment options, and bowel regimen were given as well. Questions were answered. The patient expresses understanding & wishes to proceed with surgery. Current Plans  Schedule for  Surgery Pt Education - [facility] Rectal Surgery HCI (Raniyah Curenton): discussed with patient and provided information. Pt Education - [place] (Florice Hindle) Pt Education - [facility] Pelvic Floor Exercises (Kegels) and Dysfunction HCI (Alistar Mcenery) The anatomy & physiology of the anorectal region was discussed. The pathophysiology of anorectal warts and differential diagnosis was discussed. Natural history risks without surgery was discussed such as further growth and cancer. I stressed the importance of office follow-up to catch early recurrence & minimize/halt progression of disease. Interventions such as cauterization by topical agents were discussed.  The patient's symptoms are not adequately controlled by non-operative treatments. I feel the risks & problems of no surgery outweigh the operative risks; therefore, I recommended surgery to treat the anal warts by removal, ablation and/or cauterization.  Risks such as bleeding, infection, need for further treatment, heart attack, death, and other risks were discussed. I noted a good likelihood this will help address the problem. Goals of post-operative recovery were discussed as well. Possibility that this will not correct all symptoms was explained. Post-operative pain, bleeding, constipation, and other problems after surgery were discussed. We will work to minimize complications. Educational handouts further explaining the pathology, treatment options, and bowel regimen were given as well. Questions were answered. The patient expresses understanding & wishes to proceed with surgery. HIDRADENITIS SUPPURATIVA (705.83  L73.2) Story: s/p wide excisions/skin grafts of perineum in distant past complicated by poor lymphatic drainage & hydrophallus Impression: Wound between left scrotum and inner thigh with ulceration and nodularity suspicious for recurrent condyloma or early cancer in the setting hydradenitis. This requires excision and allow open granulation. Would also  potentially remove cyst on scrotum is well.  He needs to quit smoking to keep these things from coming back. Current Plans Instructions:  using dry gauze and cotton balls to keep the area clean and dry.  Continue doxycycline for another month.  If not improved, you may benefit from repeat surgery to better drain of even remove the areas. TOBACCO ABUSE (305.1  Z72.0) Impression: I again stressed to him that he has to quit smoking or he will continue to have an healing problems and wound problems and recurrent disease. It would help him a lot to quit smoking to allow these areas to heal and hopefully not come back. He got the message. I don't know how well motivated he is. Current Plans Instructions:  STOP SMOKING!  We strongly recommend that you stop smoking. Smoking increases the risk of surgery including infection in the form of an open wound, pus formation, abscess, hernia at an incision on the abdomen, etc. You have an increased risk of other MAJOR complications such as stroke, heart attack, forming clots in the leg and/or lungs, and death.  Smoking Cessation Quitting smoking is important to your health and has many advantages. However, it is not always easy to quit since nicotine is a very addictive drug. Often times, people try 3 times or more before being able to quit. This document explains the best ways for you to prepare to quit smoking. Quitting takes hard work and a lot of effort, but you can do it. ADVANTAGES OF QUITTING SMOKING  You will live longer, feel better, and live better.  Your body will feel the impact of quitting smoking almost immediately.  Within 20 minutes, blood pressure decreases. Your pulse returns to its normal level.  After 8 hours, carbon monoxide levels in the blood return to normal. Your oxygen level increases.  After 24 hours, the chance of having a heart attack starts to decrease. Your breath, hair, and body stop smelling like smoke.  After 48  hours, damaged nerve endings begin to recover. Your sense of taste and smell improve.  After 72 hours, the body is virtually free of nicotine. Your bronchial tubes relax and breathing becomes easier.  After 2 to 12 weeks, lungs can hold more air. Exercise becomes easier and circulation improves.  The risk of having a heart attack, stroke, cancer, or lung disease is greatly reduced.  After 1 year, the risk of coronary heart disease is cut in half.  After 5 years, the risk of stroke falls to the same as a nonsmoker.  After 10 years, the risk of lung cancer is cut in half and the risk of other cancers decreases significantly.  After 15 years, the risk of coronary heart disease drops, usually to the level of a nonsmoker.  If you are pregnant, quitting smoking will improve your chances of having a healthy baby.  The people you live with, especially any children, will be healthier.  You will have extra money to spend on things other than cigarettes. QUESTIONS TO THINK ABOUT BEFORE ATTEMPTING TO QUIT You may want to talk about your answers with your caregiver.  Why do you want to quit?  If you tried to quit in the past, what helped and what did not?  What will be the most difficult situations for you after you quit? How will you plan to handle them?  Who can help you through the tough times? Your family? Friends? A caregiver?  What pleasures do you get from smoking? What ways can you still get pleasure if you quit? Here are some questions to ask your caregiver:  How can you help me to be successful at quitting?  What medicine do you think would be best for me and how should I take it?  What should I do if I need more help?  What is smoking withdrawal like? How can I get information on withdrawal? GET READY  Set a quit date.  Change your environment by getting rid of all cigarettes, ashtrays, matches, and lighters in your home, car, or work. Do not let people smoke in your  home.  Review your past attempts to quit. Think about what worked and what did not. GET SUPPORT AND ENCOURAGEMENT You have a better chance of  being successful if you have help. You can get support in many ways.  Tell your family, friends, and co-workers that you are going to quit and need their support. Ask them not to smoke around you.  Get individual, group, or telephone counseling and support. Programs are available at [employer] and health centers. Call your local health department for information about programs in your area.  Spiritual beliefs and practices may help some smokers quit.  Download a "quit meter" on your computer to keep track of quit statistics, such as how long you have gone without smoking, cigarettes not smoked, and money saved.  Get a self-help book about quitting smoking and staying off of tobacco. [place] yourself from urges to smoke. Talk to someone, go for a walk, or occupy your time with a task.  Change your normal routine. Take a different route to work. Drink tea instead of coffee. Eat breakfast in a different place.  Reduce your stress. Take a hot bath, exercise, or read a book.  Plan something enjoyable to do every day. Reward yourself for not smoking.  Explore interactive web-based programs that specialize in helping you quit. GET MEDICINE AND USE IT CORRECTLY Medicines can help you stop smoking and decrease the urge to smoke. Combining medicine with the above behavioral methods and support can greatly increase your chances of successfully quitting smoking.  Nicotine replacement therapy helps deliver nicotine to your body without the negative effects and risks of smoking. Nicotine replacement therapy includes nicotine gum, lozenges, inhalers, nasal sprays, and skin patches. Some may be available over-the-counter and others require a prescription.  Antidepressant medicine helps people abstain from smoking, but how  this works is unknown. This medicine is available by prescription.  Nicotinic receptor partial agonist medicine simulates the effect of nicotine in your brain. This medicine is available by prescription. Ask your caregiver for advice about which medicines to use and how to use them based on your health history. Your caregiver will tell you what side effects to look out for if you choose to be on a medicine or therapy. Carefully read the information on the package. Do not use any other product containing nicotine while using a nicotine replacement product. RELAPSE OR DIFFICULT SITUATIONS Most relapses occur within the first 3 months after quitting. Do not be discouraged if you start smoking again. Remember, most people try several times before finally quitting. You may have symptoms of withdrawal because your body is used to nicotine. You may crave cigarettes, be irritable, feel very hungry, cough often, get headaches, or have difficulty concentrating. The withdrawal symptoms are only temporary. They are strongest when you first quit, but they will go away within 10 14 days. To reduce the chances of relapse, try to:  Avoid drinking alcohol. Drinking lowers your chances of successfully quitting.  Reduce the amount of caffeine you consume. Once you quit smoking, the amount of caffeine in your body increases and can give you symptoms, such as a rapid heartbeat, sweating, and anxiety.  Avoid smokers because they can make you want to smoke.  Do not let weight gain distract you. Many smokers will gain weight when they quit, usually less than 10 pounds. Eat a healthy diet and stay active. You can always lose the weight gained after you quit.  Find ways to improve your mood other than smoking. FOR MORE INFORMATION www.smokefree.gov   While it can be one of the most difficult things to do, the [place]  community has programs to help you stop. Consider talking with your primary care physician about options.  Also, Smoking Cessation classes are available through the [facility]:  The smoking cessation program is a proven-effective program from the American Lung Association. The program is available for anyone 66 and older who currently smokes. The program lasts for 7 weeks and is 8 sessions. Each class will be approximately 1 1/2 hours. The program is every Tuesday. All classes are 12-1:30pm and same location.  Event Location Information: Location: [facility] 2nd Floor Conference Room 2-037; located next to [facility] cross streets: [place] Entrance into the [facility] is adjacent to the [employer] main entrance. The conference room is located on the 2nd floor. Parking Instructions: Visitor parking is adjacent to [employer] main entrance and the [place]   A smoking cessation program is also offered through the [facility]. Register online at ClickDebate.gl or call [phone] for more information. Tobacco cessation counseling is available at [facility]. Call [phone] for a free appointment. Tobacco cessation classes also are available through the [facility] in [place]. For information, call [phone]. The Patient Education Network features videos on tobacco cessation. Please consult your listings in the center of this book to find instructions on how to access this resource. If you want more information, ask your nurse.       Signed by [name], MD   [name], M.D., F.A.C.S. Gastrointestinal and Minimally Invasive Surgery [facility] [address] [place], [place] [address] [phone] Main / Paging

## 2014-10-24 NOTE — Anesthesia Preprocedure Evaluation (Addendum)
Anesthesia Evaluation  Patient identified by MRN, date of birth, ID band Patient awake    Reviewed: Allergy & Precautions, NPO status , Patient's Chart, lab work & pertinent test results  Airway Mallampati: II  TM Distance: >3 FB Neck ROM: Full    Dental no notable dental hx.    Pulmonary Current Smoker,  breath sounds clear to auscultation  Pulmonary exam normal       Cardiovascular hypertension, Pt. on medications + Peripheral Vascular Disease Rhythm:Regular Rate:Normal     Neuro/Psych negative neurological ROS  negative psych ROS   GI/Hepatic negative GI ROS, Neg liver ROS,   Endo/Other  diabetes, Insulin Dependent  Renal/GU negative Renal ROS  negative genitourinary   Musculoskeletal negative musculoskeletal ROS (+)   Abdominal   Peds negative pediatric ROS (+)  Hematology negative hematology ROS (+)   Anesthesia Other Findings   Reproductive/Obstetrics negative OB ROS                            Anesthesia Physical Anesthesia Plan  ASA: III  Anesthesia Plan: General   Post-op Pain Management:    Induction: Intravenous  Airway Management Planned: LMA  Additional Equipment:   Intra-op Plan:   Post-operative Plan: Extubation in OR  Informed Consent: I have reviewed the patients History and Physical, chart, labs and discussed the procedure including the risks, benefits and alternatives for the proposed anesthesia with the patient or authorized representative who has indicated his/her understanding and acceptance.   Dental advisory given  Plan Discussed with: CRNA and Surgeon  Anesthesia Plan Comments:         Anesthesia Quick Evaluation

## 2014-10-24 NOTE — Anesthesia Procedure Notes (Signed)
Procedure Name: LMA Insertion Date/Time: 10/24/2014 2:45 PM Performed by: Denna Haggard D Pre-anesthesia Checklist: Patient identified, Emergency Drugs available, Suction available and Patient being monitored Patient Re-evaluated:Patient Re-evaluated prior to inductionOxygen Delivery Method: Circle System Utilized Preoxygenation: Pre-oxygenation with 100% oxygen Intubation Type: IV induction Ventilation: Mask ventilation without difficulty LMA: LMA inserted LMA Size: 4.0 Number of attempts: 1 Airway Equipment and Method: Bite block Placement Confirmation: positive ETCO2 Tube secured with: Tape Dental Injury: Teeth and Oropharynx as per pre-operative assessment

## 2014-10-24 NOTE — Interval H&P Note (Signed)
History and Physical Interval Note:  10/24/2014 2:11 PM  [name]  has presented today for surgery, with the diagnosis of Condyloma perianal region, scrotal mass, groin ulcer  The various methods of treatment have been discussed with the patient and family. After consideration of risks, benefits and other options for treatment, the patient has consented to  Procedure(s): LASER ABLATION CONDOLAMATA (N/A) EXCISION OF RECURRENT LEFT GROIN ULCER AND SCROTAL MASS (N/A) as a surgical intervention .  The patient's history has been reviewed, patient examined, no change in status, stable for surgery.  I have reviewed the patient's chart and labs.  Questions were answered to the patient's satisfaction.     Regnia Mathwig C.

## 2014-10-25 ENCOUNTER — Encounter (HOSPITAL_BASED_OUTPATIENT_CLINIC_OR_DEPARTMENT_OTHER): Payer: Self-pay | Admitting: Surgery

## 2014-10-27 LAB — CULTURE, ROUTINE-ABSCESS: CULTURE: NO GROWTH

## 2014-10-31 LAB — ANAEROBIC CULTURE

## 2014-11-09 ENCOUNTER — Telehealth: Payer: Self-pay | Admitting: Endocrinology

## 2014-11-09 DIAGNOSIS — E109 Type 1 diabetes mellitus without complications: Secondary | ICD-10-CM | POA: Diagnosis not present

## 2014-11-09 NOTE — Telephone Encounter (Signed)
Patient called and would like a refill on his hydrocodone    Please advise   Thank you

## 2014-11-12 MED ORDER — HYDROCODONE-ACETAMINOPHEN 10-325 MG PO TABS: 1.0000 | ORAL_TABLET | ORAL | Status: DC | PRN

## 2014-11-12 NOTE — Telephone Encounter (Signed)
i printed 

## 2014-11-13 ENCOUNTER — Other Ambulatory Visit: Payer: Self-pay

## 2014-11-13 MED ORDER — ATORVASTATIN CALCIUM 20 MG PO TABS: 20.0000 mg | ORAL_TABLET | Freq: Every day | ORAL | Status: DC

## 2014-11-13 NOTE — Telephone Encounter (Signed)
Left voice mail advising pt that rx is ready for pick up. Rx placed up front.

## 2014-11-17 ENCOUNTER — Other Ambulatory Visit: Payer: Self-pay

## 2014-11-17 DIAGNOSIS — I739 Peripheral vascular disease, unspecified: Secondary | ICD-10-CM

## 2014-11-17 MED ORDER — CILOSTAZOL 100 MG PO TABS: 100.0000 mg | ORAL_TABLET | Freq: Two times a day (BID) | ORAL | Status: DC

## 2014-11-22 NOTE — Telephone Encounter (Signed)
error 

## 2014-12-06 ENCOUNTER — Telehealth: Payer: Self-pay | Admitting: Endocrinology

## 2014-12-06 MED ORDER — HYDROCODONE-ACETAMINOPHEN 10-325 MG PO TABS: 1.0000 | ORAL_TABLET | ORAL | Status: DC | PRN

## 2014-12-06 NOTE — Telephone Encounter (Signed)
Patient called and would like a refill on his medication    Thank you

## 2014-12-06 NOTE — Telephone Encounter (Signed)
Patient advised that rx is ready for pick up. Rx placed upfront.

## 2014-12-06 NOTE — Telephone Encounter (Signed)
i printed 

## 2014-12-06 NOTE — Telephone Encounter (Signed)
Pt is requesting a refill on his hydrocodone. Rx was last refill on 11/12/2014. Thanks!

## 2015-01-01 ENCOUNTER — Ambulatory Visit (INDEPENDENT_AMBULATORY_CARE_PROVIDER_SITE_OTHER): Payer: Medicare Other | Admitting: Endocrinology

## 2015-01-01 ENCOUNTER — Encounter: Payer: Self-pay | Admitting: Endocrinology

## 2015-01-01 VITALS — BP 136/85 | HR 103 | Temp 98.1°F | Ht 71.0 in | Wt 202.0 lb

## 2015-01-01 DIAGNOSIS — E109 Type 1 diabetes mellitus without complications: Secondary | ICD-10-CM

## 2015-01-01 DIAGNOSIS — E785 Hyperlipidemia, unspecified: Secondary | ICD-10-CM | POA: Diagnosis not present

## 2015-01-01 DIAGNOSIS — R062 Wheezing: Secondary | ICD-10-CM

## 2015-01-01 LAB — LIPID PANEL
CHOL/HDL RATIO: 3
CHOLESTEROL: 122 mg/dL (ref 0–200)
HDL: 39.1 mg/dL (ref >39.00)
LDL Cholesterol: 65 mg/dL (ref 0–99)
NONHDL: 82.9
Triglycerides: 89 mg/dL (ref 0.0–149.0)
VLDL: 17.8 mg/dL (ref 0.0–40.0)

## 2015-01-01 LAB — HEMOGLOBIN A1C: HEMOGLOBIN A1C: 8.5 % — AB (ref 4.6–6.5)

## 2015-01-01 MED ORDER — HYDROCODONE-ACETAMINOPHEN 10-325 MG PO TABS: 1.0000 | ORAL_TABLET | ORAL | Status: DC | PRN

## 2015-01-01 NOTE — Progress Notes (Signed)
Subjective:    Patient ID: [name], male    DOB: 1955/09/12, 59 y.o.   MRN: [id number]  HPI Pt returns for f/u of diabetes mellitus: DM type: Insulin-requiring type 2 Dx'ed: 7824 Complications: PAD Therapy: insulin since 2009 DKA: never Severe hypoglycemia: never Pancreatitis: never Other: he has been on a simple BID insulin regimen, after poor results with multiple daily injections; he has requested inexpensive human insulin Interval history: he brings his cbg meter which i have reviewed today.  it is lowest at hs.   Pt says he never misses the insulin.  pt states he feels well in general.  He says he is still taking 30 units bid HTN: he has slight wheezing at night.   Dyslipidemia: henies chest pain.   Past Medical History  Diagnosis Date  . Diabetic neuropathy   . Smokers' cough   . Productive cough   . GERD (gastroesophageal reflux disease)   . Hyperlipidemia   . Allergic rhinitis   . History of bladder cancer     s/p  turbt  2013/   transitional cell carcinoma--   . Hypertension   . Lower urinary tract symptoms (LUTS)   . Type 2 diabetes mellitus with insulin therapy     monitor by  dr ellsion  . History of gout   . Wears dentures     upper  . At risk for sleep apnea     STOP-BANG= 5   SENT TO PCP 03-14-2014  . Hidradenitis suppurativa     h/p massive perineal involvement in past  . PVD (peripheral vascular disease) with claudication     bilateral SFA disease-- right > left  and left tibial artery disease--  per duplex  . Perianal condylomata     recurrent   . Scrotal mass     Past Surgical History  Procedure Laterality Date  . Laser ablation of penile and perianal warts  07-29-2007  Dr. Johney Maine  . Left shoulder surgery  2003  . Cataract extraction w/ intraocular lens implant Right   . Transurethral resection of bladder tumor  05/21/2012    Procedure: TRANSURETHRAL RESECTION OF BLADDER TUMOR (TURBT);  Surgeon: Claybon Jabs, MD;  Location: [facility];  Service: Urology;  Laterality: N/A;     . Inguinal hidradenitis excision  1998, 1999  . Perineal hidradenitis excision  1998, 1999  . Axillary hidradenitis excision  1997  . Laser ablation condolamata N/A 03/20/2014    Procedure: EXAM UNDER ANESTHESIA, REMOVAL/ABLATION OF CONDYLOMATA PENIS,GROINS, ANUS, ANAL CANAL;  Surgeon: [name], MD;  Location: [place];  Service: General;  Laterality: N/A;  groin and anus  . Co2 laser application N/A 2/35/3614    Procedure: CO2 LASER APPLICATION,PENIS, GROIN, ANUS;  Surgeon: [name], MD;  Location: [facility];  Service: General;  Laterality: N/A;  . Cardiovascular stress test  07-24-2014  dr Kathlyn [place]    Low risk lexiscan nuclear study with apical thinning and small inferolateral wall infarct at mid & basal level , no ischemia/  normal LVF and wall motion , ef 59%  . Laser ablation condolamata N/A 10/24/2014    Procedure: LASER ABLATION CONDOLAMATA;  Surgeon: Michael Boston, MD;  Location: [facility];  Service: General;  Laterality: N/A;  . Mass excision N/A 10/24/2014    Procedure: EXCISION OF PERINEAL MASS/SINUS;  Surgeon: Michael Boston, MD;  Location: [place];  Service: General;  Laterality: N/A;  .  Hemorrhoid surgery  10/24/2014    Procedure: HEMORRHOIDECTOMY;  Surgeon: Michael Boston, MD;  Location: [facility];  Service: General;;  . Incision and drainage abscess Left 10/24/2014    Procedure: INCISION AND DRAINAGE ABSCESS;  Surgeon: Michael Boston, MD;  Location: [place];  Service: General;  Laterality: Left;    History   Social History  . Marital Status: Married    Spouse Name: N/A  . Number of Children: N/A  . Years of Education: N/A   Occupational History  . Not on file.   Social History Main Topics  . Smoking status: Current Every Day Smoker -- 1.00 packs/day for 41 years    Types: Cigarettes  . Smokeless  tobacco: Never Used  . Alcohol Use: No  . Drug Use: No  . Sexual Activity: Not on file   Other Topics Concern  . Not on file   Social History Narrative    Current Outpatient Prescriptions on File Prior to Visit  Medication Sig Dispense Refill  . albuterol (PROVENTIL HFA;VENTOLIN HFA) 108 (90 BASE) MCG/ACT inhaler Inhale 2 puffs into the lungs every 6 (six) hours as needed for wheezing. 1 Inhaler 2  . atorvastatin (LIPITOR) 20 MG tablet Take 1 tablet (20 mg total) by mouth daily. 90 tablet 1  . calcium carbonate (TUMS - DOSED IN MG ELEMENTAL CALCIUM) 500 MG chewable tablet Chew 1 tablet by mouth as needed.     . cilostazol (PLETAL) 100 MG tablet Take 1 tablet (100 mg total) by mouth 2 (two) times daily. 60 tablet 6  . insulin lispro protamine-lispro (HUMALOG 75/25 MIX) (75-25) 100 UNIT/ML SUSP injection Inject into the skin. 35 units with breakfast, and 25 units with the evening meal, and pen needles 2/day    . losartan-hydrochlorothiazide (HYZAAR) 100-12.5 MG per tablet Take 1 tablet by mouth daily. (Patient taking differently: Take 1 tablet by mouth every evening. ) 30 tablet 11   No current facility-administered medications on file prior to visit.    No Known Allergies  Family History  Problem Relation Age of Onset  . Colon cancer Neg Hx   . Esophageal cancer Neg Hx   . Pancreatic cancer Neg Hx   . Prostate cancer Neg Hx   . Kidney disease Neg Hx   . Liver disease Neg Hx   . Lung cancer Neg Hx   . Diabetes Maternal Aunt     x 2  . Hypertension Mother   . Cancer Father     lung ca    BP 136/85 mmHg  Pulse 103  Temp(Src) 98.1 F (36.7 C) (Oral)  Ht 5\' 11"  (1.803 m)  Wt 202 lb (91.627 kg)  BMI 28.19 kg/m2  SpO2 91%    Review of Systems Denies LOC and weight change    Objective:   Physical Exam VITAL SIGNS:  See vs page GENERAL: no distress LUNGS:  Clear to auscultation Pulses: dorsalis pedis intact bilat.   MSK: no deformity of the feet CV: no leg  edema Skin:  no ulcer on the feet.  normal color and temp on the feet. Neuro: sensation is intact to touch on the feet, but decreased from normal  Lab Results  Component Value Date   HGBA1C 8.5* 01/01/2015   Lab Results  Component Value Date   CHOL 122 01/01/2015   HDL 39.10 01/01/2015   LDLCALC 65 01/01/2015   TRIG 89.0 01/01/2015   CHOLHDL 3 01/01/2015    i personally reviewed spirometry tracing.  Assessment & Plan:  HTN: well-controlled: Please continue the same medication. Restrictive lung dz, new, prob due to obesity.  Weight-loss is advised. DM: he needs increased rx Dyslipidemia: well-controlled--Please continue the same medication.  Patient is advised the following: Patient Instructions  Please continue the same medication for blood pressure Please change the insulin to 35 units with breakfast, and 25 units with the evening meal. check your blood sugar twice a day.  vary the time of day when you check, between before the 3 meals, and at bedtime.  also check if you have symptoms of your blood sugar being too high or too low.  please keep a record of the readings and bring it to your next appointment here.  You can write it on any piece of paper.  please call us sooner if your blood sugar goes below 70, or if you have a lot of readings over 200. blood tests are being requested for you today.  We'll let you know about the results.   Please come back for a follow-up appointment in 3 months.

## 2015-01-01 NOTE — Patient Instructions (Addendum)
Please continue the same medication for blood pressure Please change the insulin to 35 units with breakfast, and 25 units with the evening meal. check your blood sugar twice a day.  vary the time of day when you check, between before the 3 meals, and at bedtime.  also check if you have symptoms of your blood sugar being too high or too low.  please keep a record of the readings and bring it to your next appointment here.  You can write it on any piece of paper.  please call us sooner if your blood sugar goes below 70, or if you have a lot of readings over 200. blood tests are being requested for you today.  We'll let you know about the results.   Please come back for a follow-up appointment in 3 months.

## 2015-01-16 ENCOUNTER — Telehealth: Payer: Self-pay | Admitting: Gastroenterology

## 2015-01-16 NOTE — Telephone Encounter (Signed)
Spoke with [name]. The patient is having spells of diarrhea after eating sometime. He is awakened during the night to have a diarrhea stool. Sometimes he has incontinence. No fever or bloody diarrhea. Appointment scheduled.

## 2015-01-17 ENCOUNTER — Other Ambulatory Visit (INDEPENDENT_AMBULATORY_CARE_PROVIDER_SITE_OTHER): Payer: Self-pay

## 2015-01-17 ENCOUNTER — Encounter: Payer: Self-pay | Admitting: Physician Assistant

## 2015-01-17 ENCOUNTER — Other Ambulatory Visit: Payer: Medicare Other

## 2015-01-17 ENCOUNTER — Ambulatory Visit (INDEPENDENT_AMBULATORY_CARE_PROVIDER_SITE_OTHER): Payer: Medicare Other | Admitting: Physician Assistant

## 2015-01-17 VITALS — BP 152/76 | HR 84 | Ht 71.0 in | Wt 200.1 lb

## 2015-01-17 DIAGNOSIS — Z860100 Personal history of colon polyps, unspecified: Secondary | ICD-10-CM

## 2015-01-17 DIAGNOSIS — L02419 Cutaneous abscess of limb, unspecified: Secondary | ICD-10-CM

## 2015-01-17 DIAGNOSIS — R197 Diarrhea, unspecified: Secondary | ICD-10-CM

## 2015-01-17 LAB — CBC WITH DIFFERENTIAL/PLATELET
Basophils Absolute: 0.1 10*3/uL (ref 0.0–0.1)
Basophils Relative: 0.7 % (ref 0.0–3.0)
EOS PCT: 2.3 % (ref 0.0–5.0)
Eosinophils Absolute: 0.3 10*3/uL (ref 0.0–0.7)
HEMATOCRIT: 49.9 % (ref 39.0–52.0)
HEMOGLOBIN: 17.1 g/dL — AB (ref 13.0–17.0)
LYMPHS PCT: 25.7 % (ref 12.0–46.0)
Lymphs Abs: 2.8 10*3/uL (ref 0.7–4.0)
MCHC: 34.3 g/dL (ref 30.0–36.0)
MCV: 96.8 fl (ref 78.0–100.0)
MONOS PCT: 6.5 % (ref 3.0–12.0)
Monocytes Absolute: 0.7 10*3/uL (ref 0.1–1.0)
NEUTROS ABS: 7.1 10*3/uL (ref 1.4–7.7)
NEUTROS PCT: 64.8 % (ref 43.0–77.0)
PLATELETS: 312 10*3/uL (ref 150.0–400.0)
RBC: 5.16 Mil/uL (ref 4.22–5.81)
RDW: 13 % (ref 11.5–15.5)
WBC: 10.9 10*3/uL — ABNORMAL HIGH (ref 4.0–10.5)

## 2015-01-17 LAB — IGA: IgA: 292 mg/dL (ref 68–378)

## 2015-01-17 MED ORDER — SACCHAROMYCES BOULARDII 250 MG PO CAPS: 250.0000 mg | ORAL_CAPSULE | Freq: Two times a day (BID) | ORAL | Status: DC

## 2015-01-17 NOTE — Patient Instructions (Signed)
Follow up on 02/08/15 at 1:15 pm with [facility] physician has requested that you go to the basement for lab work before leaving today. We have sent medications to your pharmacy for you to pick up at your convenience. Follow up with Dr Loanne Drilling 01/19/15 3:45 pm.

## 2015-01-17 NOTE — Progress Notes (Signed)
Patient ID: [name], male   DOB: 1956-03-26, 59 y.o.   MRN: [id number]     History of Present Illness: [name] is a 59 year old male known to Dr. Deatra Ina with a long-term history of type 1 diabetes. He has a history of hidradenitis and condyloma as well. He has a history of bladder cancer and has had surgery 3 times in the past for pilonidal cyst and complications from that surgery. He had a colonoscopy in January 2011 at which time 2 small polyps were removed from the sigmoid colon. He was also noted to have diverticulosis. Pathology revealed the polyps to be tubular adenomas and he was advised to have surveillance in 5 years. He was recently started on Pletal for peripheral vascular disease.  On 10/24/2014 he underwent laser ablation condylomata, excision rectal mass, hemorrhoidectomy, and incision and drainage of an abscess. He was on anti-biotics for "2-3 weeks" after surgery. While on the anti-biotic's he began to have diarrhea 5-7 times per day. The diarrhea stopped and he saw Dr. Johney Maine last month in follow-up. He states about a week later he began to develop watery diarrhea that he is now having 10-15 times per day with nocturnal stooling. Diarrhea is foul-smelling. He has no bright red blood per rectum or melena. He has no nausea or vomiting. He has no fever or chills. He also reports that he has an abscess in the left axilla that he has been training for the past month. He recently saw his PCP but didn't think of mentioning it at that visit. He states he has been squeezing it to drain pus out every few days.   Past Medical History  Diagnosis Date  . Diabetic neuropathy   . Smokers' cough   . Productive cough   . GERD (gastroesophageal reflux disease)   . Hyperlipidemia   . Allergic rhinitis   . History of bladder cancer     s/p  turbt  2013/   transitional cell carcinoma--   . Hypertension   . Lower urinary tract symptoms (LUTS)   . Type 2 diabetes mellitus with insulin  therapy     monitor by  dr ellsion  . History of gout   . Wears dentures     upper  . At risk for sleep apnea     STOP-BANG= 5   SENT TO PCP 03-14-2014  . Hidradenitis suppurativa     h/p massive perineal involvement in past  . PVD (peripheral vascular disease) with claudication     bilateral SFA disease-- right > left  and left tibial artery disease--  per duplex  . Perianal condylomata     recurrent   . Scrotal mass     Past Surgical History  Procedure Laterality Date  . Laser ablation of penile and perianal warts  07-29-2007  Dr. Johney Maine  . Left shoulder surgery  2003  . Cataract extraction w/ intraocular lens implant Right   . Transurethral resection of bladder tumor  05/21/2012    Procedure: TRANSURETHRAL RESECTION OF BLADDER TUMOR (TURBT);  Surgeon: Claybon Jabs, MD;  Location: [facility];  Service: Urology;  Laterality: N/A;     . Inguinal hidradenitis excision  1998, 1999  . Perineal hidradenitis excision  1998, 1999  . Axillary hidradenitis excision  1997  . Laser ablation condolamata N/A 03/20/2014    Procedure: EXAM UNDER ANESTHESIA, REMOVAL/ABLATION OF CONDYLOMATA PENIS,GROINS, ANUS, ANAL CANAL;  Surgeon: [name], MD;  Location: [place];  Service: General;  Laterality: N/A;  groin and anus  . Co2 laser application N/A 03/20/1750    Procedure: CO2 LASER APPLICATION,PENIS, GROIN, ANUS;  Surgeon: [name], MD;  Location: [facility];  Service: General;  Laterality: N/A;  . Cardiovascular stress test  07-24-2014  dr Kathlyn [place]    Low risk lexiscan nuclear study with apical thinning and small inferolateral wall infarct at mid & basal level , no ischemia/  normal LVF and wall motion , ef 59%  . Laser ablation condolamata N/A 10/24/2014    Procedure: LASER ABLATION CONDOLAMATA;  Surgeon: Michael Boston, MD;  Location: [facility];  Service: General;  Laterality: N/A;  . Mass excision N/A 10/24/2014      Procedure: EXCISION OF PERINEAL MASS/SINUS;  Surgeon: Michael Boston, MD;  Location: [facility];  Service: General;  Laterality: N/A;  . Hemorrhoid surgery  10/24/2014    Procedure: HEMORRHOIDECTOMY;  Surgeon: Michael Boston, MD;  Location: [facility];  Service: General;;  . Incision and drainage abscess Left 10/24/2014    Procedure: INCISION AND DRAINAGE ABSCESS;  Surgeon: Michael Boston, MD;  Location: [place];  Service: General;  Laterality: Left;   Family History  Problem Relation Age of Onset  . Colon cancer Neg Hx   . Esophageal cancer Neg Hx   . Pancreatic cancer Neg Hx   . Prostate cancer Neg Hx   . Kidney disease Neg Hx   . Liver disease Neg Hx   . Lung cancer Neg Hx   . Diabetes Maternal Aunt     x 2  . Hypertension Mother   . Cancer Father     lung ca   History  Substance Use Topics  . Smoking status: Current Every Day Smoker -- 1.00 packs/day for 41 years    Types: Cigarettes  . Smokeless tobacco: Never Used  . Alcohol Use: No   Current Outpatient Prescriptions  Medication Sig Dispense Refill  . albuterol (PROVENTIL HFA;VENTOLIN HFA) 108 (90 BASE) MCG/ACT inhaler Inhale 2 puffs into the lungs every 6 (six) hours as needed for wheezing. 1 Inhaler 2  . atorvastatin (LIPITOR) 20 MG tablet Take 1 tablet (20 mg total) by mouth daily. 90 tablet 1  . cilostazol (PLETAL) 100 MG tablet Take 1 tablet (100 mg total) by mouth 2 (two) times daily. 60 tablet 6  . HYDROcodone-acetaminophen (NORCO) 10-325 MG per tablet Take 1-2 tablets by mouth every 4 (four) hours as needed for moderate pain or severe pain. 100 tablet 0  . insulin lispro protamine-lispro (HUMALOG 75/25 MIX) (75-25) 100 UNIT/ML SUSP injection Inject into the skin. 35 units with breakfast, and 25 units with the evening meal, and pen needles 2/day    . losartan-hydrochlorothiazide (HYZAAR) 100-12.5 MG per tablet Take 1 tablet by mouth daily. (Patient taking differently: Take  1 tablet by mouth every evening. ) 30 tablet 11  . saccharomyces boulardii (FLORASTOR) 250 MG capsule Take 1 capsule (250 mg total) by mouth 2 (two) times daily. 120 capsule 0   No current facility-administered medications for this visit.   No Known Allergies    Review of Systems: Per history of present illness otherwise negative   Physical Exam: General: Pleasant, well developed male in no acute distress Head: Normocephalic and atraumatic Eyes:  sclerae anicteric, conjunctiva pink  Ears: Normal auditory acuity Lungs: Clear throughout to auscultation Heart: Regular rate and rhythm Abdomen: Soft, non distended, non-tender. No masses, no hepatomegaly. Normal bowel  sounds Musculoskeletal: Symmetrical with no gross deformities  Extremities: No edema . Left axilla with a 2 x 3 red, firm, indurated area that currently is not fluctuant as patient states he recently drained. Neurological: Alert oriented x 4, grossly nonfocal Psychological:  Alert and cooperative. Normal mood and affect  Assessment and Recommendations: #1 diarrhea. Patient sugars have been running high and recent A1c was above 8. He has had recently had anti-biotics. Stool for C. difficile will be obtained along with a stool fecal elastase, IgA, and tissue transglutaminase. He will empirically be started on fluoroscopy store 250 mg 2 capsules twice a day for 30 days. If he is positive for C. difficile he will be treated with a course of Flagyl. If his elastase is low he will be given a trial of Creon. He will follow up in one month.  #2. Personal history of adenomatous polyps. He is due for surveillance colonoscopy but we will revisit this when he returns in 1 month for a follow-up of his diarrhea. He will need to be off his Pletal for several days prior to the colonoscopy.  #3. Left axillary abscess. Patient has been encouraged to follow with his PCP for this he has had problems with hidradenitis in the past as well. CBC will  be obtained today.     Leean Amezcua, Vita Barley PA-C 01/17/2015,   CC: Dr. Loanne Drilling

## 2015-01-18 LAB — CLOSTRIDIUM DIFFICILE BY PCR: Toxigenic C. Difficile by PCR: NOT DETECTED

## 2015-01-19 ENCOUNTER — Ambulatory Visit (INDEPENDENT_AMBULATORY_CARE_PROVIDER_SITE_OTHER): Payer: Medicare Other | Admitting: Endocrinology

## 2015-01-19 ENCOUNTER — Encounter: Payer: Self-pay | Admitting: Endocrinology

## 2015-01-19 VITALS — BP 140/60 | HR 102 | Temp 98.1°F | Wt 199.0 lb

## 2015-01-19 DIAGNOSIS — E1051 Type 1 diabetes mellitus with diabetic peripheral angiopathy without gangrene: Secondary | ICD-10-CM | POA: Diagnosis not present

## 2015-01-19 LAB — TISSUE TRANSGLUTAMINASE, IGA: TISSUE TRANSGLUTAMINASE AB, IGA: 1 U/mL (ref <4)

## 2015-01-19 MED ORDER — DOXYCYCLINE HYCLATE 100 MG PO TABS: 100.0000 mg | ORAL_TABLET | Freq: Every day | ORAL | Status: DC

## 2015-01-19 NOTE — Progress Notes (Signed)
Subjective:    Patient ID: [name], male    DOB: 03/14/1956, 59 y.o.   MRN: [id number]  HPI Pt returns for f/u of diabetes mellitus: DM type: Insulin-requiring type 2 Dx'ed: 5784 Complications: PAD and polyneuropathy Therapy: insulin since 2009 DKA: never Severe hypoglycemia: never Pancreatitis: never Other: he has been on a simple BID insulin regimen, after poor results with multiple daily injections; he has requested inexpensive human insulin Interval history: no cbg record, but states cbg's are in the mid-100's.  There is no trend throughout the day. Pt states many years of slighty painful nodules at the left axilla, but no assoc fever. Past Medical History  Diagnosis Date  . Diabetic neuropathy   . Smokers' cough   . Productive cough   . GERD (gastroesophageal reflux disease)   . Hyperlipidemia   . Allergic rhinitis   . History of bladder cancer     s/p  turbt  2013/   transitional cell carcinoma--   . Hypertension   . Lower urinary tract symptoms (LUTS)   . Type 2 diabetes mellitus with insulin therapy     monitor by  dr ellsion  . History of gout   . Wears dentures     upper  . At risk for sleep apnea     STOP-BANG= 5   SENT TO PCP 03-14-2014  . Hidradenitis suppurativa     h/p massive perineal involvement in past  . PVD (peripheral vascular disease) with claudication     bilateral SFA disease-- right > left  and left tibial artery disease--  per duplex  . Perianal condylomata     recurrent   . Scrotal mass     Past Surgical History  Procedure Laterality Date  . Laser ablation of penile and perianal warts  07-29-2007  Dr. Johney Maine  . Left shoulder surgery  2003  . Cataract extraction w/ intraocular lens implant Right   . Transurethral resection of bladder tumor  05/21/2012    Procedure: TRANSURETHRAL RESECTION OF BLADDER TUMOR (TURBT);  Surgeon: Claybon Jabs, MD;  Location: [facility];  Service: Urology;  Laterality: N/A;     . Inguinal  hidradenitis excision  1998, 1999  . Perineal hidradenitis excision  1998, 1999  . Axillary hidradenitis excision  1997  . Laser ablation condolamata N/A 03/20/2014    Procedure: EXAM UNDER ANESTHESIA, REMOVAL/ABLATION OF CONDYLOMATA PENIS,GROINS, ANUS, ANAL CANAL;  Surgeon: [name], MD;  Location: [place];  Service: General;  Laterality: N/A;  groin and anus  . Co2 laser application N/A 6/96/2952    Procedure: CO2 LASER APPLICATION,PENIS, GROIN, ANUS;  Surgeon: [name], MD;  Location[facility];  Service: General;  Laterality: N/A;  . Cardiovascular stress test  07-24-2014  dr Kathlyn Sacramento    Low risk lexiscan nuclear study with apical thinning and small inferolateral wall infarct at mid & basal level , no ischemia/  normal LVF and wall motion , ef 59%  . Laser ablation condolamata N/A 10/24/2014    Procedure: LASER ABLATION CONDOLAMATA;  Surgeon: Michael Boston, MD;  Location: [facility];  Service: General;  Laterality: N/A;  . Mass excision N/A 10/24/2014    Procedure: EXCISION OF PERINEAL MASS/SINUS;  Surgeon: Michael Boston, MD;  Location: [facility];  Service: General;  Laterality: N/A;  . Hemorrhoid surgery  10/24/2014    Procedure: HEMORRHOIDECTOMY;  Surgeon: Michael Boston, MD;  Location: [facility];  Service: General;;  . Incision and drainage abscess Left 10/24/2014    Procedure: INCISION AND DRAINAGE ABSCESS;  Surgeon: Michael Boston, MD;  Location: [place];  Service: General;  Laterality: Left;    History   Social History  . Marital Status: Married    Spouse Name: N/A  . Number of Children: N/A  . Years of Education: N/A   Occupational History  . Disabled    Social History Main Topics  . Smoking status: Current Every Day Smoker -- 1.00 packs/day for 41 years    Types: Cigarettes  . Smokeless tobacco: Never Used  . Alcohol Use: No  . Drug Use: No  . Sexual  Activity: Not on file   Other Topics Concern  . Not on file   Social History Narrative    Current Outpatient Prescriptions on File Prior to Visit  Medication Sig Dispense Refill  . albuterol (PROVENTIL HFA;VENTOLIN HFA) 108 (90 BASE) MCG/ACT inhaler Inhale 2 puffs into the lungs every 6 (six) hours as needed for wheezing. 1 Inhaler 2  . atorvastatin (LIPITOR) 20 MG tablet Take 1 tablet (20 mg total) by mouth daily. 90 tablet 1  . cilostazol (PLETAL) 100 MG tablet Take 1 tablet (100 mg total) by mouth 2 (two) times daily. 60 tablet 6  . HYDROcodone-acetaminophen (NORCO) 10-325 MG per tablet Take 1-2 tablets by mouth every 4 (four) hours as needed for moderate pain or severe pain. 100 tablet 0  . insulin lispro protamine-lispro (HUMALOG 75/25 MIX) (75-25) 100 UNIT/ML SUSP injection Inject into the skin. 35 units with breakfast, and 25 units with the evening meal, and pen needles 2/day    . losartan-hydrochlorothiazide (HYZAAR) 100-12.5 MG per tablet Take 1 tablet by mouth daily. (Patient taking differently: Take 1 tablet by mouth every evening. ) 30 tablet 11  . saccharomyces boulardii (FLORASTOR) 250 MG capsule Take 1 capsule (250 mg total) by mouth 2 (two) times daily. 120 capsule 0   No current facility-administered medications on file prior to visit.    No Known Allergies  Family History  Problem Relation Age of Onset  . Colon cancer Neg Hx   . Esophageal cancer Neg Hx   . Pancreatic cancer Neg Hx   . Prostate cancer Neg Hx   . Kidney disease Neg Hx   . Liver disease Neg Hx   . Lung cancer Neg Hx   . Diabetes Maternal Aunt     x 2  . Hypertension Mother   . Cancer Father     lung ca    BP 140/60 mmHg  Pulse 102  Temp(Src) 98.1 F (36.7 C) (Oral)  Wt 199 lb (90.266 kg)  SpO2 96%  Review of Systems Denies right axillary rash and hypoglycemia    Objective:   Physical Exam VITAL SIGNS:  See vs page GENERAL: no distress Left axilla: moderate folliculitis, with a  draining pustule, 2 cm diameter.  Pulses: dorsalis pedis intact bilat.   MSK: no deformity of the feet CV: no leg edema.   Skin:  no ulcer on the feet.  normal color and temp on the feet. Neuro: sensation is intact to touch on the feet, but slightly decreased from normal.        Assessment & Plan:  Folliculitis, worse DM: apparently well-controlled  Patient is advised the following: Patient Instructions  check your blood sugar twice a day.  vary the time of day when you check, between before the 3 meals, and at bedtime.  also check if you have symptoms of your blood sugar being too high or too low.  please keep a record of the readings and bring it to your next appointment here.  You can write it on any piece of paper.  please call us sooner if your blood sugar goes below 70, or if you have a lot of readings over 200. Please continue the same insulin.   Please come back for a follow-up appointment in 2 months.  i have sent a prescription to your pharmacy, for an antibiotic pill.

## 2015-01-19 NOTE — Patient Instructions (Addendum)
check your blood sugar twice a day.  vary the time of day when you check, between before the 3 meals, and at bedtime.  also check if you have symptoms of your blood sugar being too high or too low.  please keep a record of the readings and bring it to your next appointment here.  You can write it on any piece of paper.  please call us sooner if your blood sugar goes below 70, or if you have a lot of readings over 200. Please continue the same insulin.   Please come back for a follow-up appointment in 2 months.  i have sent a prescription to your pharmacy, for an antibiotic pill.

## 2015-01-22 ENCOUNTER — Telehealth: Payer: Self-pay | Admitting: Endocrinology

## 2015-01-22 NOTE — Telephone Encounter (Signed)
See note below and please advise during Dr. Ellison's absence? Thanks! 

## 2015-01-22 NOTE — Progress Notes (Signed)
Reviewed and agree with management. Prudence Heiny D. Bich Mchaney, M.D., FACG  

## 2015-01-22 NOTE — Telephone Encounter (Signed)
Rx was prescribed on Friday 6/10 for a abscess underneath his arm.

## 2015-01-22 NOTE — Telephone Encounter (Signed)
Left voicemail advising of note below. Requested call back if pt would like to discuss.

## 2015-01-22 NOTE — Telephone Encounter (Signed)
There is no good alternative for this. Please try buying only 10 days' worth, and see if this is all you need.

## 2015-01-22 NOTE — Telephone Encounter (Signed)
What is this prescribed for and for how long?

## 2015-01-22 NOTE — Telephone Encounter (Signed)
Pt needs alternate for the doxycycline it is too expensive

## 2015-01-22 NOTE — Telephone Encounter (Signed)
Pt's doxycycline is too expensive can this be switched to something else please

## 2015-01-23 ENCOUNTER — Ambulatory Visit (INDEPENDENT_AMBULATORY_CARE_PROVIDER_SITE_OTHER): Payer: Medicare Other | Admitting: Cardiovascular Disease

## 2015-01-23 ENCOUNTER — Encounter: Payer: Self-pay | Admitting: Cardiovascular Disease

## 2015-01-23 VITALS — BP 150/74 | HR 83 | Ht 71.0 in | Wt 199.8 lb

## 2015-01-23 DIAGNOSIS — Z720 Tobacco use: Secondary | ICD-10-CM

## 2015-01-23 DIAGNOSIS — I739 Peripheral vascular disease, unspecified: Secondary | ICD-10-CM

## 2015-01-23 DIAGNOSIS — F17200 Nicotine dependence, unspecified, uncomplicated: Secondary | ICD-10-CM

## 2015-01-23 DIAGNOSIS — R0789 Other chest pain: Secondary | ICD-10-CM | POA: Diagnosis not present

## 2015-01-23 DIAGNOSIS — I10 Essential (primary) hypertension: Secondary | ICD-10-CM | POA: Diagnosis not present

## 2015-01-23 NOTE — Patient Instructions (Signed)
Medication Instructions:  Your physician recommends that you continue on your current medications as directed. Please refer to the Current Medication list given to you today.  Labwork:   Testing/Procedures:    Follow-Up: Your physician wants you to follow-up in:  [place] will receive a reminder letter in the mail two months in advance. If you don't receive a letter, please call our office to schedule the follow-up appointment.   Any Other Special Instructions Will Be Listed Below (If Applicable).

## 2015-01-23 NOTE — Assessment & Plan Note (Signed)
I again discussed with him the importance of smoking cessation but he does not think he can quit at the present time.

## 2015-01-23 NOTE — Progress Notes (Signed)
PCP: Dr. Loanne Drilling  HPI  This is a 59  year old male who is here today for a follow up visit regarding PAD. He has known history of insulin-requiring DM (dx'ed 1992),  Hyperlipidemia, hypertension and tobacco use. He reports long history (many years) of right calf claudication after walking 100 yard. He has to rest for few minutes before he can resume again. No rest pain or ulceration. No chest pain , dyspnea or cardiac symptoms.  Noninvasive evaluation in August 2015 showed ABI: normal on the left and 0.44 on right.  Duplex showed long occlusion of right SFA from mid to distal segment with reconstitution in popliteal artery. There was significant left SFA stenosis.  He had improvement in symptoms on cilostazol. Claudication seems to be stable since last visit. He had atypical chest pain during last visit. Nuclear stress test showed a small inferolateral infarct with no significant ischemia. He continues to smoke. He seems to be under stress as he decided to proceed with separation from his wife.  No Known Allergies   Current Outpatient Prescriptions on File Prior to Visit  Medication Sig Dispense Refill  . albuterol (PROVENTIL HFA;VENTOLIN HFA) 108 (90 BASE) MCG/ACT inhaler Inhale 2 puffs into the lungs every 6 (six) hours as needed for wheezing. 1 Inhaler 2  . atorvastatin (LIPITOR) 20 MG tablet Take 1 tablet (20 mg total) by mouth daily. 90 tablet 1  . cilostazol (PLETAL) 100 MG tablet Take 1 tablet (100 mg total) by mouth 2 (two) times daily. 60 tablet 6  . HYDROcodone-acetaminophen (NORCO) 10-325 MG per tablet Take 1-2 tablets by mouth every 4 (four) hours as needed for moderate pain or severe pain. 100 tablet 0  . insulin lispro protamine-lispro (HUMALOG 75/25 MIX) (75-25) 100 UNIT/ML SUSP injection Inject into the skin. 35 units with breakfast, and 25 units with the evening meal, and pen needles 2/day    . losartan-hydrochlorothiazide (HYZAAR) 100-12.5 MG per tablet Take 1 tablet by mouth  daily. (Patient taking differently: Take 1 tablet by mouth every evening. ) 30 tablet 11  . saccharomyces boulardii (FLORASTOR) 250 MG capsule Take 1 capsule (250 mg total) by mouth 2 (two) times daily. 120 capsule 0  . doxycycline (VIBRA-TABS) 100 MG tablet Take 1 tablet (100 mg total) by mouth daily. (Patient not taking: Reported on 01/23/2015) 30 tablet 11   No current facility-administered medications on file prior to visit.     Past Medical History  Diagnosis Date  . Diabetic neuropathy   . Smokers' cough   . Productive cough   . GERD (gastroesophageal reflux disease)   . Hyperlipidemia   . Allergic rhinitis   . History of bladder cancer     s/p  turbt  2013/   transitional cell carcinoma--   . Hypertension   . Lower urinary tract symptoms (LUTS)   . Type 2 diabetes mellitus with insulin therapy     monitor by  dr ellsion  . History of gout   . Wears dentures     upper  . At risk for sleep apnea     STOP-BANG= 5   SENT TO PCP 03-14-2014  . Hidradenitis suppurativa     h/p massive perineal involvement in past  . PVD (peripheral vascular disease) with claudication     bilateral SFA disease-- right > left  and left tibial artery disease--  per duplex  . Perianal condylomata     recurrent   . Scrotal mass      Past  Surgical History  Procedure Laterality Date  . Laser ablation of penile and perianal warts  07-29-2007  Dr. Johney Maine  . Left shoulder surgery  2003  . Cataract extraction w/ intraocular lens implant Right   . Transurethral resection of bladder tumor  05/21/2012    Procedure: TRANSURETHRAL RESECTION OF BLADDER TUMOR (TURBT);  Surgeon: Claybon Jabs, MD;  Location: [facility];  Service: Urology;  Laterality: N/A;     . Inguinal hidradenitis excision  1998, 1999  . Perineal hidradenitis excision  1998, 1999  . Axillary hidradenitis excision  1997  . Laser ablation condolamata N/A 03/20/2014    Procedure: EXAM UNDER ANESTHESIA, REMOVAL/ABLATION OF  CONDYLOMATA PENIS,GROINS, ANUS, ANAL CANAL;  Surgeon: [name], MD;  Location: [place];  Service: General;  Laterality: N/A;  groin and anus  . Co2 laser application N/A 5/00/9381    Procedure: CO2 LASER APPLICATION,PENIS, GROIN, ANUS;  Surgeon: [name], MD;  Location: [facility];  Service: General;  Laterality: N/A;  . Cardiovascular stress test  07-24-2014  dr Kathlyn Sacramento    Low risk lexiscan nuclear study with apical thinning and small inferolateral wall infarct at mid & basal level , no ischemia/  normal LVF and wall motion , ef 59%  . Laser ablation condolamata N/A 10/24/2014    Procedure: LASER ABLATION CONDOLAMATA;  Surgeon: Michael Boston, MD;  Location: [facility];  Service: General;  Laterality: N/A;  . Mass excision N/A 10/24/2014    Procedure: EXCISION OF PERINEAL MASS/SINUS;  Surgeon: Michael Boston, MD;  Location: [facility];  Service: General;  Laterality: N/A;  . Hemorrhoid surgery  10/24/2014    Procedure: HEMORRHOIDECTOMY;  Surgeon: Michael Boston, MD;  Location: [facility];  Service: General;;  . Incision and drainage abscess Left 10/24/2014    Procedure: INCISION AND DRAINAGE ABSCESS;  Surgeon: Michael Boston, MD;  Location: [place];  Service: General;  Laterality: Left;     Family History  Problem Relation Age of Onset  . Colon cancer Neg Hx   . Esophageal cancer Neg Hx   . Pancreatic cancer Neg Hx   . Prostate cancer Neg Hx   . Kidney disease Neg Hx   . Liver disease Neg Hx   . Lung cancer Neg Hx   . Diabetes Maternal Aunt     x 2  . Hypertension Mother   . Cancer Father     lung ca     History   Social History  . Marital Status: Married    Spouse Name: N/A  . Number of Children: N/A  . Years of Education: N/A   Occupational History  . Disabled    Social History Main Topics  . Smoking status: Current Every Day Smoker -- 1.00 packs/day for 41  years    Types: Cigarettes  . Smokeless tobacco: Never Used  . Alcohol Use: No  . Drug Use: No  . Sexual Activity: Not on file   Other Topics Concern  . Not on file   Social History Narrative        PHYSICAL EXAM   BP 150/74 mmHg  Pulse 83  Ht 5\' 11"  (1.803 m)  Wt 199 lb 12.8 oz (90.629 kg)  BMI 27.88 kg/m2 Constitutional: He is oriented to person, place, and time. He appears well-developed and well-nourished. No distress.  HENT: No nasal discharge.  Head: Normocephalic and atraumatic.  Eyes: Pupils are equal and  round.  No discharge. Neck: Normal range of motion. Neck supple. No JVD present. No thyromegaly present.  Cardiovascular: Normal rate, regular rhythm, normal heart sounds. Exam reveals no gallop and no friction rub. No murmur heard.  Pulmonary/Chest: Effort normal and breath sounds normal. No stridor. No respiratory distress. He has no wheezes. He has no rales. He exhibits no tenderness.  Abdominal: Soft. Bowel sounds are normal. He exhibits no distension. There is no tenderness. There is no rebound and no guarding.  Musculoskeletal: Normal range of motion. He exhibits no edema and no tenderness.  Neurological: He is alert and oriented to person, place, and time. Coordination normal.  Skin: Skin is warm and dry. No rash noted. He is not diaphoretic. No erythema. No pallor.  Psychiatric: He has a normal mood and affect. His behavior is normal. Judgment and thought content normal.  Vascular: femoral pulse: normal on both sides. Distal pulses: faint on left and not palpable on right.       ASSESSMENT AND PLAN

## 2015-01-23 NOTE — Assessment & Plan Note (Signed)
His claudication seems to be stable and currently is not lifestyle limiting. Continue medical therapy.

## 2015-01-23 NOTE — Assessment & Plan Note (Addendum)
Nuclear stress test showed no evidence of ischemia and was overall low risk. Continue aggressive medical therapy.

## 2015-01-23 NOTE — Assessment & Plan Note (Signed)
Blood pressure is well controlled on current medications. 

## 2015-01-26 ENCOUNTER — Other Ambulatory Visit: Payer: Self-pay | Admitting: *Deleted

## 2015-01-26 LAB — PANCREATIC ELASTASE, FECAL: Pancreatic Elastase-1, Stool: 217 mcg/g

## 2015-01-26 MED ORDER — PANCRELIPASE (LIP-PROT-AMYL) 24000-76000 UNITS PO CPEP: ORAL_CAPSULE | ORAL | Status: DC

## 2015-01-30 ENCOUNTER — Telehealth: Payer: Self-pay | Admitting: Endocrinology

## 2015-01-30 NOTE — Telephone Encounter (Signed)
Pt needs refill on hydrocodone.  °

## 2015-01-31 ENCOUNTER — Telehealth: Payer: Self-pay | Admitting: Physician Assistant

## 2015-01-31 MED ORDER — HYDROCODONE-ACETAMINOPHEN 10-325 MG PO TABS: 1.0000 | ORAL_TABLET | ORAL | Status: DC | PRN

## 2015-01-31 NOTE — Telephone Encounter (Signed)
Yes

## 2015-01-31 NOTE — Telephone Encounter (Signed)
Rx placed up front and pt advised rx is ready for pick up.

## 2015-01-31 NOTE — Telephone Encounter (Signed)
The patient called and I called him back.  He said he could not afford the prescription we sent to the pharmacy.  Prancrelipase. He said it was $290.00. It was ordered by Dr. Olevia Perches. He is a Dr. Deatra Ina patient and I advised him to follow up with Dr/ Deatra Ina in 1 month,.

## 2015-01-31 NOTE — Telephone Encounter (Signed)
Could you sign off on this Rx during Dr. Cordelia Pen absence? Last rx was written on 01/01/2015, Thanks!

## 2015-02-08 ENCOUNTER — Encounter: Payer: Self-pay | Admitting: Physician Assistant

## 2015-02-08 ENCOUNTER — Ambulatory Visit (INDEPENDENT_AMBULATORY_CARE_PROVIDER_SITE_OTHER): Payer: Medicare Other | Admitting: Physician Assistant

## 2015-02-08 VITALS — BP 138/70 | HR 80 | Ht 68.25 in | Wt 197.0 lb

## 2015-02-08 DIAGNOSIS — R197 Diarrhea, unspecified: Secondary | ICD-10-CM

## 2015-02-08 DIAGNOSIS — Z86010 Personal history of colonic polyps: Secondary | ICD-10-CM | POA: Diagnosis not present

## 2015-02-08 DIAGNOSIS — I739 Peripheral vascular disease, unspecified: Secondary | ICD-10-CM | POA: Diagnosis not present

## 2015-02-08 DIAGNOSIS — Z7902 Long term (current) use of antithrombotics/antiplatelets: Secondary | ICD-10-CM | POA: Diagnosis not present

## 2015-02-08 MED ORDER — NA SULFATE-K SULFATE-MG SULF 17.5-3.13-1.6 GM/177ML PO SOLN: ORAL | Status: DC

## 2015-02-08 NOTE — Progress Notes (Signed)
Patient ID: [name], male   DOB: 12-16-1955, 59 y.o.   MRN: [id number]     History of Present Illness: [name] is a 59 year old male who was recently seen on June 8. He is known to Dr. Deatra Ina and has a history of type 1 diabetes, hidradenitis, condyloma, bladder cancer, and pilonidal cyst. He had a colonoscopy in January 2011 at which time 2 small polyps were removed from the sigmoid colon. He was also noted to have diverticulosis. Pathology revealed the polyps to be tubular adenomas and he was advised to have surveillance in 5 years. At his last visit he was complaining of diarrhea that was occurring 10-15 times a day. In March 2006 he had underwent laser ablation condylomata, excision rectal mass, hemorrhoidectomy, and incision and drainage of an abscess. He was on anti-biotics for several weeks after surgery. While on the anti-biotics he began to have diarrhea. At his last visit he was sent for a stool for C. difficile which was negative. Pancreatic fecal elastase was 217 in the low normal range. Creon was ordered but he never picked it up because it was too expensive. IgA was normal at 292 and tissue transglutaminase was 1. He has been using fluoroscopy store and states that he is currently having 3 or 4 mushy bowel movements daily. He has no bright red blood per rectum or melena. He states he is in the donut hole for his medications and cannot afford the majority of his medications for the last week of each month.   Past Medical History  Diagnosis Date  . Diabetic neuropathy   . Smokers' cough   . Productive cough   . GERD (gastroesophageal reflux disease)   . Hyperlipidemia   . Allergic rhinitis   . History of bladder cancer     s/p  turbt  2013/   transitional cell carcinoma--   . Hypertension   . Lower urinary tract symptoms (LUTS)   . Type 2 diabetes mellitus with insulin therapy     monitor by  dr ellsion  . History of gout   . Wears dentures     upper  . At risk for  sleep apnea     STOP-BANG= 5   SENT TO PCP 03-14-2014  . Hidradenitis suppurativa     h/p massive perineal involvement in past  . PVD (peripheral vascular disease) with claudication     bilateral SFA disease-- right > left  and left tibial artery disease--  per duplex  . Perianal condylomata     recurrent   . Scrotal mass     Past Surgical History  Procedure Laterality Date  . Laser ablation of penile and perianal warts  07-29-2007  Dr. Johney Maine  . Left shoulder surgery  2003  . Cataract extraction w/ intraocular lens implant Right   . Transurethral resection of bladder tumor  05/21/2012    Procedure: TRANSURETHRAL RESECTION OF BLADDER TUMOR (TURBT);  Surgeon: Claybon Jabs, MD;  Location: [facility];  Service: Urology;  Laterality: N/A;     . Inguinal hidradenitis excision  1998, 1999  . Perineal hidradenitis excision  1998, 1999  . Axillary hidradenitis excision  1997  . Laser ablation condolamata N/A 03/20/2014    Procedure: EXAM UNDER ANESTHESIA, REMOVAL/ABLATION OF CONDYLOMATA PENIS,GROINS, ANUS, ANAL CANAL;  Surgeon: [name], MD;  Location: [place];  Service: General;  Laterality: N/A;  groin and anus  . Co2 laser application N/A 2/42/6834  Procedure: CO2 LASER APPLICATION,PENIS, GROIN, ANUS;  Surgeon: [name], MD;  Location: [facility];  Service: General;  Laterality: N/A;  . Cardiovascular stress test  07-24-2014  dr Kathlyn Sacramento    Low risk lexiscan nuclear study with apical thinning and small inferolateral wall infarct at mid & basal level , no ischemia/  normal LVF and wall motion , ef 59%  . Laser ablation condolamata N/A 10/24/2014    Procedure: LASER ABLATION CONDOLAMATA;  Surgeon: Michael Boston, MD;  Location: [facility];  Service: General;  Laterality: N/A;  . Mass excision N/A 10/24/2014    Procedure: EXCISION OF PERINEAL MASS/SINUS;  Surgeon: Michael Boston, MD;  Location: [facility];  Service: General;  Laterality: N/A;  . Hemorrhoid surgery  10/24/2014    Procedure: HEMORRHOIDECTOMY;  Surgeon: Michael Boston, MD;  Location: [facility];  Service: General;;  . Incision and drainage abscess Left 10/24/2014    Procedure: INCISION AND DRAINAGE ABSCESS;  Surgeon: Michael Boston, MD;  Location: [place];  Service: General;  Laterality: Left;   Family History  Problem Relation Age of Onset  . Colon cancer Neg Hx   . Esophageal cancer Neg Hx   . Pancreatic cancer Neg Hx   . Prostate cancer Neg Hx   . Kidney disease Neg Hx   . Liver disease Neg Hx   . Lung cancer Neg Hx   . Diabetes Maternal Aunt     x 2  . Hypertension Mother   . Cancer Father     lung ca   History  Substance Use Topics  . Smoking status: Current Every Day Smoker -- 1.00 packs/day for 41 years    Types: Cigarettes  . Smokeless tobacco: Never Used  . Alcohol Use: No   Current Outpatient Prescriptions  Medication Sig Dispense Refill  . albuterol (PROVENTIL HFA;VENTOLIN HFA) 108 (90 BASE) MCG/ACT inhaler Inhale 2 puffs into the lungs every 6 (six) hours as needed for wheezing. 1 Inhaler 2  . atorvastatin (LIPITOR) 20 MG tablet Take 1 tablet (20 mg total) by mouth daily. 90 tablet 1  . cilostazol (PLETAL) 100 MG tablet Take 1 tablet (100 mg total) by mouth 2 (two) times daily. 60 tablet 6  . HYDROcodone-acetaminophen (NORCO) 10-325 MG per tablet Take 1-2 tablets by mouth every 4 (four) hours as needed for moderate pain or severe pain. 100 tablet 0  . insulin lispro protamine-lispro (HUMALOG 75/25 MIX) (75-25) 100 UNIT/ML SUSP injection Inject into the skin. 35 units with breakfast, and 25 units with the evening meal, and pen needles 2/day    . losartan-hydrochlorothiazide (HYZAAR) 100-12.5 MG per tablet Take 1 tablet by mouth daily. (Patient taking differently: Take 1 tablet by mouth every evening. ) 30 tablet 11  . saccharomyces boulardii (FLORASTOR) 250 MG  capsule Take 1 capsule (250 mg total) by mouth 2 (two) times daily. 120 capsule 0  . Na Sulfate-K Sulfate-Mg Sulf (SUPREP BOWEL PREP) SOLN As directed 1 Bottle 0   No current facility-administered medications for this visit.   No Known Allergies    Review of Systems: Gen: Denies any fever, chills, sweats, anorexia, fatigue, weakness, malaise, weight loss, and sleep disorder CV: Denies chest pain, angina, palpitations, syncope, orthopnea, PND, peripheral edema, and claudication. Resp: Denies dyspnea at rest, dyspnea with exercise, cough, sputum, wheezing, coughing up blood, and pleurisy. GI: Denies vomiting blood, jaundice, and fecal incontinence.   Denies dysphagia or odynophagia. GU : Denies  urinary burning, blood in urine, urinary frequency, urinary hesitancy, nocturnal urination, and urinary incontinence. MS: Denies joint pain, limitation of movement, and swelling, stiffness, low back pain, extremity pain. Denies muscle weakness, cramps, atrophy.  Derm: Denies rash, itching, dry skin, hives, moles, warts, or unhealing ulcers.  Psych: Denies depression, anxiety, memory loss, suicidal ideation, hallucinations, paranoia, and confusion. Heme: Denies bruising, bleeding, and enlarged lymph nodes. Neuro:  Denies any headaches, dizziness, paresthesia Endo:  Denies any problems with DM, thyroid, adrenal    Physical Exam: General: Pleasant, well developed ,male in no acute distress Head: Normocephalic and atraumatic Eyes:  sclerae anicteric, conjunctiva pink  Ears: Normal auditory acuity Lungs: Clear throughout to auscultation Heart: Regular rate and rhythm Abdomen: Soft, non distended, non-tender. No masses, no hepatomegaly. Normal bowel sounds Musculoskeletal: Symmetrical with no gross deformities  Extremities: No edema  Neurological: Alert oriented x 4, grossly nonfocal Psychological:  Alert and cooperative. Normal mood and affect  Assessment and Recommendations: 59 year old male  with a personal history of adenomatous colon polyps due for surveillance colonoscopy to evaluate for recurrent polyps or neoplasia. Patient has been having diarrhea for several months. Her diarrhea is improving with use of a probiotic but he says he is not "back to normal" yet. We will schedule a colonoscopy to evaluate for polyps, neoplasia, or possible microscopic colitis.The risks, benefits, and alternatives to colonoscopy with possible biopsy and possible polypectomy were discussed with the patient and they consent to proceed. Hold pletal  5 days before procedure - will instruct when and how to resume after procedure. Risks and benefits of procedure including bleeding, perforation, infection, missed lesions, medication reactions and possible hospitalization or surgery if complications occur explained. Additional rare but real risk of cardiovascular event such as heart attack or ischemia/infarct of other organs off pletal explained and need to seek urgent help if this occurs. Will communicate by phone or EMR with patient's prescribing provider that to confirm holding pletal is reasonable in this case.         Chalmers Iddings, Vita Barley PA-C 02/08/2015,

## 2015-02-08 NOTE — Patient Instructions (Addendum)
You have been given a separate informational sheet regarding your tobacco use, the importance of quitting and local resources to help you quit.  You have been scheduled for a colonoscopy. Please follow written instructions given to you at your visit today.  Please pick up your prep supplies at the pharmacy within the next 1-3 days. If you use inhalers (even only as needed), please bring them with you on the day of your procedure.  We have given you suprep for your procedure.  You will be contaced by our office prior to your procedure for directions on holding your Pletel.  If you do not hear from our office 1 week prior to your scheduled procedure, please call [phone] to discuss.

## 2015-02-09 NOTE — Progress Notes (Signed)
Reviewed and agree with management. Mirko Tailor D. [place], M.D., FACG  

## 2015-02-19 ENCOUNTER — Telehealth: Payer: Self-pay | Admitting: Endocrinology

## 2015-02-19 NOTE — Telephone Encounter (Signed)
Pt stated that he was trying to call another office for his Endoscopy procedure being done today

## 2015-02-19 NOTE — Telephone Encounter (Signed)
Pt called returning call from office please call pt back at [phone]

## 2015-02-22 ENCOUNTER — Encounter: Payer: Self-pay | Admitting: Gastroenterology

## 2015-02-22 ENCOUNTER — Ambulatory Visit (AMBULATORY_SURGERY_CENTER): Payer: Medicare Other | Admitting: Gastroenterology

## 2015-02-22 VITALS — BP 118/68 | HR 52 | Temp 96.8°F | Resp 14 | Ht 68.0 in | Wt 197.0 lb

## 2015-02-22 DIAGNOSIS — D124 Benign neoplasm of descending colon: Secondary | ICD-10-CM | POA: Diagnosis not present

## 2015-02-22 DIAGNOSIS — Z1211 Encounter for screening for malignant neoplasm of colon: Secondary | ICD-10-CM | POA: Diagnosis not present

## 2015-02-22 DIAGNOSIS — D123 Benign neoplasm of transverse colon: Secondary | ICD-10-CM

## 2015-02-22 DIAGNOSIS — Z860100 Personal history of colon polyps, unspecified: Secondary | ICD-10-CM

## 2015-02-22 DIAGNOSIS — D122 Benign neoplasm of ascending colon: Secondary | ICD-10-CM | POA: Diagnosis not present

## 2015-02-22 DIAGNOSIS — K621 Rectal polyp: Secondary | ICD-10-CM | POA: Diagnosis not present

## 2015-02-22 DIAGNOSIS — D128 Benign neoplasm of rectum: Secondary | ICD-10-CM

## 2015-02-22 LAB — GLUCOSE, CAPILLARY
Glucose-Capillary: 140 mg/dL — ABNORMAL HIGH (ref 65–99)
Glucose-Capillary: 177 mg/dL — ABNORMAL HIGH (ref 65–99)

## 2015-02-22 MED ORDER — SODIUM CHLORIDE 0.9 % IV SOLN: 500.0000 mL | INTRAVENOUS | Status: DC

## 2015-02-22 NOTE — Op Note (Addendum)
[place]  [employer] [place], [address]   COLONOSCOPY PROCEDURE REPORT  PATIENT: [name], [name]  MR#: [id number] BIRTHDATE: [birth year] , 69  yrs. old GENDER: male ENDOSCOPIST: Inda Castle, MD REFERRED FY:BOFB Rupert Stacks, M.D. PROCEDURE DATE:  02/22/2015 PROCEDURE:   Colonoscopy, surveillance , Colonoscopy with biopsy, Colonoscopy with snare polypectomy, and Submucosal injection, any substance First Screening Colonoscopy - Avg.  risk and is 50 yrs.  old or older - No.  Prior Negative Screening - Now for repeat screening. N/A  History of Adenoma - Now for follow-up colonoscopy & has been > or = to 3 yrs.  Yes hx of adenoma.  Has been 3 or more years since last colonoscopy.  Polyps removed today? Yes ASA CLASS:   Class II INDICATIONS:PH Colon Adenoma. MEDICATIONS: Monitored anesthesia care and Propofol 290 mg IV , glucagon 1mg  IV  DESCRIPTION OF PROCEDURE:   After the risks benefits and alternatives of the procedure were thoroughly explained, informed consent was obtained.  The digital rectal exam revealed no abnormalities of the rectum.   The LB [id number]  endoscope was introduced through the anus and advanced to the cecum, which was identified by both the appendix and ileocecal valve. No adverse events experienced.   The quality of the prep was (Suprep was used) good.  The instrument was then slowly withdrawn as the colon was fully examined. Estimated blood loss is zero unless otherwise noted in this procedure report.      COLON FINDINGS: A sessile polyp measuring 15 mm in size was found in the ascending colon.  A polypectomy was performed in a piecemeal fashion.  The resection was complete, the polyp tissue was completely retrieved and sent to histology.   A sessile polyp measuring 4 mm in size was found in the ascending colon.  A polypectomy was performed with a cold snare.   A sessile polyp measuring 6 mm in size was found in the  proximal transverse colon. A polypectomy was performed with a cold snare.  The resection was complete, the polyp tissue was completely retrieved and sent to histology.   A flat polyp measuring 20 mm in size was found in the distal transverse colon.  A saline Injection was given to lift the mucosal wall.  A polypectomy was performed using snare cautery.   A sessile polyp measuring 15 mm in size was found in the proximal descending colon.  A polypectomy was performed using snare cautery. The resection was complete, the polyp tissue was completely retrieved and sent to histology.   A sessile polyp measuring 4 mm in size was found in the distal descending colon.  A polypectomy was performed with a cold snare.  The resection was complete, the polyp tissue was completely retrieved and sent to histology.   In the rectal vault there was prominent mucosa corresponding to an area of prior transrectal cystectomy.  Biopsies were taken to rule out adenomatous changes.  the area was tattooed with spot. Retroflexed views revealed no abnormalities. The time to cecum = 3.0 Withdrawal time = 30.4   The scope was withdrawn and the procedure completed. COMPLICATIONS: There were no immediate complications.  ENDOSCOPIC IMPRESSION: colonic polyposis  RECOMMENDATIONS: colonoscopy 3 years pending pathology results avoid NSAIDs and full strength aspirin for 2 weeks  eSigned:  Inda Castle, MD 02/22/2015 12:40 PM Revised: 02/22/2015 12:40 PM  cc:   PATIENT NAME:  [name] MR#: [id number]

## 2015-02-22 NOTE — Progress Notes (Signed)
A/ox3 pleased with MAC, report to Suzanne RN 

## 2015-02-22 NOTE — Patient Instructions (Addendum)
YOU HAD AN ENDOSCOPIC PROCEDURE TODAY AT [facility]:   Refer to the procedure report that was given to you for any specific questions about what was found during the examination.  If the procedure report does not answer your questions, please call your gastroenterologist to clarify.  If you requested that your care partner not be given the details of your procedure findings, then the procedure report has been included in a sealed envelope for you to review at your convenience later.  YOU SHOULD EXPECT: Some feelings of bloating in the abdomen. Passage of more gas than usual.  Walking can help get rid of the air that was put into your GI tract during the procedure and reduce the bloating. If you had a lower endoscopy (such as a colonoscopy or flexible sigmoidoscopy) you may notice spotting of blood in your stool or on the toilet paper. If you underwent a bowel prep for your procedure, you may not have a normal bowel movement for a few days.  Please Note:  You might notice some irritation and congestion in your nose or some drainage.  This is from the oxygen used during your procedure.  There is no need for concern and it should clear up in a day or so.  SYMPTOMS TO REPORT IMMEDIATELY:   Following lower endoscopy (colonoscopy or flexible sigmoidoscopy):  Excessive amounts of blood in the stool  Significant tenderness or worsening of abdominal pains  Swelling of the abdomen that is new, acute  Fever of 100F or higher   For urgent or emergent issues, a gastroenterologist can be reached at any hour by calling [phone].   DIET: Your first meal following the procedure should be a small meal and then it is ok to progress to your normal diet. Heavy or fried foods are harder to digest and may make you feel nauseous or bloated.  Likewise, meals heavy in dairy and vegetables can increase bloating.  Drink plenty of fluids but you should avoid alcoholic beverages for 24  hours.  ACTIVITY:  You should plan to take it easy for the rest of today and you should NOT DRIVE or use heavy machinery until tomorrow (because of the sedation medicines used during the test).    FOLLOW UP: Our staff will call the number listed on your records the next business day following your procedure to check on you and address any questions or concerns that you may have regarding the information given to you following your procedure. If we do not reach you, we will leave a message.  However, if you are feeling well and you are not experiencing any problems, there is no need to return our call.  We will assume that you have returned to your regular daily activities without incident.  If any biopsies were taken you will be contacted by phone or by letter within the next 1-3 weeks.  Please call us at [phone] if you have not heard about the biopsies in 3 weeks.    SIGNATURES/CONFIDENTIALITY: You and/or your care partner have signed paperwork which will be entered into your electronic medical record.  These signatures attest to the fact that that the information above on your After Visit Summary has been reviewed and is understood.  Full responsibility of the confidentiality of this discharge information lies with you and/or your care-partner.  Read all of the handouts given to you by your recovery room nurse.  There is a clip in your colon.  Be sure  to show your card if you have an MRI or a CT Scan.  It should pass within a couple of months.  Do not use NSAIDS nor Aspirin for two weeks due to the removal of a polyp with cautery.  You may start back on your pletal on Tuessday July 19th per Dr. Deatra Ina.

## 2015-02-22 NOTE — Progress Notes (Signed)
Called to room to assist during endoscopic procedure.  Patient ID and intended procedure confirmed with present staff. Received instructions for my participation in the procedure from the performing physician.  

## 2015-02-23 ENCOUNTER — Telehealth: Payer: Self-pay

## 2015-02-23 NOTE — Telephone Encounter (Signed)
  Follow up Call-  Call back number 02/22/2015  Post procedure Call Back phone  # [phone]  Permission to leave phone message Yes     Patient questions:  Do you have a fever, pain , or abdominal swelling? No. Pain Score  0 *  Have you tolerated food without any problems? Yes.    Have you been able to return to your normal activities? Yes.    Do you have any questions about your discharge instructions: Diet   No. Medications  No. Follow up visit  No.  Do you have questions or concerns about your Care? No.  Actions: * If pain score is 4 or above: No action needed, pain <4.

## 2015-03-02 ENCOUNTER — Encounter: Payer: Self-pay | Admitting: Gastroenterology

## 2015-03-08 ENCOUNTER — Telehealth: Payer: Self-pay

## 2015-03-08 MED ORDER — HYDROCODONE-ACETAMINOPHEN 10-325 MG PO TABS: 1.0000 | ORAL_TABLET | ORAL | Status: DC | PRN

## 2015-03-08 NOTE — Telephone Encounter (Signed)
Pt called requesting a refill on his Hydrocodone Last refill was given on 01/31/2015. Could you sign during Dr. Cordelia Pen absence. Thanks!

## 2015-03-08 NOTE — Telephone Encounter (Signed)
Rx Printed 

## 2015-03-08 NOTE — Telephone Encounter (Signed)
[name], Could you call this pt and advise him his Rx is ready for pick up? Thanks!

## 2015-03-08 NOTE — Telephone Encounter (Signed)
OK 

## 2015-03-09 NOTE — Telephone Encounter (Signed)
Called pt and advised him that his rx was ready to pick up.

## 2015-03-12 DIAGNOSIS — A630 Anogenital (venereal) warts: Secondary | ICD-10-CM | POA: Diagnosis not present

## 2015-03-12 DIAGNOSIS — L732 Hidradenitis suppurativa: Secondary | ICD-10-CM | POA: Diagnosis not present

## 2015-04-02 ENCOUNTER — Ambulatory Visit (INDEPENDENT_AMBULATORY_CARE_PROVIDER_SITE_OTHER): Payer: Medicare Other | Admitting: Endocrinology

## 2015-04-02 ENCOUNTER — Encounter: Payer: Self-pay | Admitting: Endocrinology

## 2015-04-02 VITALS — BP 124/72 | HR 95 | Temp 97.5°F | Ht 68.25 in | Wt 195.0 lb

## 2015-04-02 DIAGNOSIS — E1051 Type 1 diabetes mellitus with diabetic peripheral angiopathy without gangrene: Secondary | ICD-10-CM

## 2015-04-02 DIAGNOSIS — M549 Dorsalgia, unspecified: Secondary | ICD-10-CM | POA: Insufficient documentation

## 2015-04-02 DIAGNOSIS — M545 Low back pain: Secondary | ICD-10-CM

## 2015-04-02 LAB — URINALYSIS, ROUTINE W REFLEX MICROSCOPIC
Bilirubin Urine: NEGATIVE
Hgb urine dipstick: NEGATIVE
Ketones, ur: NEGATIVE
Leukocytes, UA: NEGATIVE
Nitrite: NEGATIVE
PH: 5 (ref 5.0–8.0)
RBC / HPF: NONE SEEN (ref 0–?)
SPECIFIC GRAVITY, URINE: 1.025 (ref 1.000–1.030)
Total Protein, Urine: NEGATIVE
Urine Glucose: NEGATIVE
Urobilinogen, UA: 0.2 (ref 0.0–1.0)
WBC UA: NONE SEEN (ref 0–?)

## 2015-04-02 LAB — POCT GLYCOSYLATED HEMOGLOBIN (HGB A1C): HEMOGLOBIN A1C: 8.7

## 2015-04-02 MED ORDER — HYDROCODONE-ACETAMINOPHEN 10-325 MG PO TABS: 1.0000 | ORAL_TABLET | ORAL | Status: DC | PRN

## 2015-04-02 MED ORDER — INSULIN NPH (HUMAN) (ISOPHANE) 100 UNIT/ML ~~LOC~~ SUSP: SUBCUTANEOUS | Status: DC

## 2015-04-02 NOTE — Progress Notes (Signed)
Subjective:    Patient ID: [name], male    DOB: May 26, 1956, 59 y.o.   MRN: [id number]  HPI Pt returns for f/u of diabetes mellitus: DM type: Insulin-requiring type 2 Dx'ed: 2440 Complications: PAD and polyneuropathy Therapy: insulin since 2009 DKA: never Severe hypoglycemia: never Pancreatitis: never Other: he has been on a simple BID insulin regimen, after poor results with multiple daily injections; he has requested inexpensive human insulin Interval history: He reduced the am insulin to 30 units, due to mild hypoglycemia after breakfast, when breakfast is delayed.  no cbg record, but states it is in general higher as the day goes on.   Past Medical History  Diagnosis Date  . Diabetic neuropathy   . Smokers' cough   . Productive cough   . GERD (gastroesophageal reflux disease)   . Hyperlipidemia   . Allergic rhinitis   . History of bladder cancer     s/p  turbt  2013/   transitional cell carcinoma--   . Hypertension   . Lower urinary tract symptoms (LUTS)   . Type 2 diabetes mellitus with insulin therapy     monitor by  dr ellsion  . History of gout   . Wears dentures     upper  . At risk for sleep apnea     STOP-BANG= 5   SENT TO PCP 03-14-2014  . Hidradenitis suppurativa     h/p massive perineal involvement in past  . PVD (peripheral vascular disease) with claudication     bilateral SFA disease-- right > left  and left tibial artery disease--  per duplex  . Perianal condylomata     recurrent   . Scrotal mass     Past Surgical History  Procedure Laterality Date  . Laser ablation of penile and perianal warts  07-29-2007  Dr. Johney Maine  . Left shoulder surgery  2003  . Cataract extraction w/ intraocular lens implant Right   . Transurethral resection of bladder tumor  05/21/2012    Procedure: TRANSURETHRAL RESECTION OF BLADDER TUMOR (TURBT);  Surgeon: Claybon Jabs, MD;  Location: [facility];  Service: Urology;  Laterality: N/A;     . Inguinal  hidradenitis excision  1998, 1999  . Perineal hidradenitis excision  1998, 1999  . Axillary hidradenitis excision  1997  . Laser ablation condolamata N/A 03/20/2014    Procedure: EXAM UNDER ANESTHESIA, REMOVAL/ABLATION OF CONDYLOMATA PENIS,GROINS, ANUS, ANAL CANAL;  Surgeon: [name], MD;  Location: [place];  Service: General;  Laterality: N/A;  groin and anus  . Co2 laser application N/A 08/13/7251    Procedure: CO2 LASER APPLICATION,PENIS, GROIN, ANUS;  Surgeon: [name], MD;  Location: [facility];  Service: General;  Laterality: N/A;  . Cardiovascular stress test  07-24-2014  dr Kathlyn [place]    Low risk lexiscan nuclear study with apical thinning and small inferolateral wall infarct at mid & basal level , no ischemia/  normal LVF and wall motion , ef 59%  . Laser ablation condolamata N/A 10/24/2014    Procedure: LASER ABLATION CONDOLAMATA;  Surgeon: Michael [place], MD;  Location: [facility];  Service: General;  Laterality: N/A;  . Mass excision N/A 10/24/2014    Procedure: EXCISION OF PERINEAL MASS/SINUS;  Surgeon: Michael [place], MD;  Location: [facility];  Service: General;  Laterality: N/A;  . Hemorrhoid surgery  10/24/2014    Procedure: HEMORRHOIDECTOMY;  Surgeon: Michael [place], MD;  Location: [facility];  Service: General;;  . Incision and drainage abscess Left 10/24/2014    Procedure: INCISION AND DRAINAGE ABSCESS;  Surgeon: Michael [place], MD;  Location: [place];  Service: General;  Laterality: Left;    Social History   Social History  . Marital Status: Married    Spouse Name: N/A  . Number of Children: N/A  . Years of Education: N/A   Occupational History  . Disabled    Social History Main Topics  . Smoking status: Current Every Day Smoker -- 1.00 packs/day for 41 years    Types: Cigars  . Smokeless tobacco: Never Used  . Alcohol Use: No  . Drug Use: No  . Sexual  Activity: Not on file   Other Topics Concern  . Not on file   Social History Narrative    Current Outpatient Prescriptions on File Prior to Visit  Medication Sig Dispense Refill  . albuterol (PROVENTIL HFA;VENTOLIN HFA) 108 (90 BASE) MCG/ACT inhaler Inhale 2 puffs into the lungs every 6 (six) hours as needed for wheezing. 1 Inhaler 2  . atorvastatin (LIPITOR) 20 MG tablet Take 1 tablet (20 mg total) by mouth daily. 90 tablet 1  . cilostazol (PLETAL) 100 MG tablet Take 1 tablet (100 mg total) by mouth 2 (two) times daily. 60 tablet 6  . losartan-hydrochlorothiazide (HYZAAR) 100-12.5 MG per tablet Take 1 tablet by mouth daily. (Patient taking differently: Take 1 tablet by mouth every evening. ) 30 tablet 11  . saccharomyces boulardii (FLORASTOR) 250 MG capsule Take 1 capsule (250 mg total) by mouth 2 (two) times daily. 120 capsule 0   No current facility-administered medications on file prior to visit.    No Known Allergies  Family History  Problem Relation Age of Onset  . Colon cancer Neg Hx   . Esophageal cancer Neg Hx   . Pancreatic cancer Neg Hx   . Prostate cancer Neg Hx   . Kidney disease Neg Hx   . Liver disease Neg Hx   . Lung cancer Neg Hx   . Diabetes Maternal Aunt     x 2  . Hypertension Mother   . Cancer Father     lung ca    BP 124/72 mmHg  Pulse 95  Temp(Src) 97.5 F (36.4 C) (Oral)  Ht 5' 8.25" (1.734 m)  Wt 195 lb (88.451 kg)  BMI 29.42 kg/m2  SpO2 98%   Review of Systems Denies LOC.  He has 1 week of slight low-back pain (requests UA).      Objective:   Physical Exam VITAL SIGNS:  See vs page. GENERAL: no distress.  Pulses: dorsalis pedis intact bilat.   MSK: no deformity of the feet. CV: no leg edema. Skin:  no ulcer on the feet.  normal color and temp on the feet.  Neuro: sensation is intact to touch on the feet.  A1c=8.7%     Assessment & Plan:  DM: glycemic control is worse Low-back pain, new.  Patient is advised the  following: Patient Instructions  Please change the insulin to NPH, 35 units each morning, and 25 units each evening. check your blood sugar twice a day.  vary the time of day when you check, between before the 3 meals, and at bedtime.  also check if you have symptoms of your blood sugar being too high or too low.  please keep a record of the readings and bring it to your next appointment here.  You can write it on  any piece of paper.  please call us sooner if your blood sugar goes below 70, or if you have a lot of readings over 200.  A urine blood test is requested for you today.  We'll let you know about the results. Please come back for a follow-up appointment in 3 months.      correction: check ua

## 2015-04-02 NOTE — Patient Instructions (Addendum)
Please change the insulin to NPH, 35 units each morning, and 25 units each evening. check your blood sugar twice a day.  vary the time of day when you check, between before the 3 meals, and at bedtime.  also check if you have symptoms of your blood sugar being too high or too low.  please keep a record of the readings and bring it to your next appointment here.  You can write it on any piece of paper.  please call us sooner if your blood sugar goes below 70, or if you have a lot of readings over 200.  A urine blood test is requested for you today.  We'll let you know about the results. Please come back for a follow-up appointment in 3 months.

## 2015-05-04 ENCOUNTER — Telehealth: Payer: Self-pay | Admitting: Endocrinology

## 2015-05-04 MED ORDER — HYDROCODONE-ACETAMINOPHEN 10-325 MG PO TABS: 1.0000 | ORAL_TABLET | ORAL | Status: DC | PRN

## 2015-05-04 NOTE — Telephone Encounter (Signed)
Patient need a prescription of his Hydrocodone

## 2015-05-04 NOTE — Telephone Encounter (Signed)
i printed 

## 2015-05-04 NOTE — Telephone Encounter (Signed)
Pt is going to come and pick up rx from front desk

## 2015-05-04 NOTE — Telephone Encounter (Signed)
Will you approve this Rx?

## 2015-05-10 DIAGNOSIS — C679 Malignant neoplasm of bladder, unspecified: Secondary | ICD-10-CM | POA: Diagnosis not present

## 2015-05-10 DIAGNOSIS — A630 Anogenital (venereal) warts: Secondary | ICD-10-CM | POA: Diagnosis not present

## 2015-05-11 ENCOUNTER — Other Ambulatory Visit: Payer: Self-pay | Admitting: Urology

## 2015-05-15 ENCOUNTER — Encounter (HOSPITAL_BASED_OUTPATIENT_CLINIC_OR_DEPARTMENT_OTHER): Payer: Self-pay | Admitting: *Deleted

## 2015-05-16 ENCOUNTER — Encounter (HOSPITAL_BASED_OUTPATIENT_CLINIC_OR_DEPARTMENT_OTHER): Payer: Self-pay | Admitting: *Deleted

## 2015-05-16 NOTE — Progress Notes (Signed)
Pt instructed npo p mn 10/9 x pletal and lipitor w sip of water.  Pt aware no smoking after mn as well. Pt not to take pm dose of insulin 10/9, nor am dose DOD.  Pt has hx of hypoglemia am of surgery d/t pm insulin day before surgery. To [facility] 10/10 @ 0945.  Needs istat,ekg.

## 2015-05-21 ENCOUNTER — Ambulatory Visit (HOSPITAL_BASED_OUTPATIENT_CLINIC_OR_DEPARTMENT_OTHER): Payer: Medicare Other | Admitting: Anesthesiology

## 2015-05-21 ENCOUNTER — Other Ambulatory Visit: Payer: Self-pay

## 2015-05-21 ENCOUNTER — Ambulatory Visit (HOSPITAL_BASED_OUTPATIENT_CLINIC_OR_DEPARTMENT_OTHER)
Admission: RE | Admit: 2015-05-21 | Discharge: 2015-05-21 | Disposition: A | Discharge status: Home / Self Care | Payer: Medicare Other | Source: Ambulatory Visit | Attending: Urology | Admitting: Urology

## 2015-05-21 ENCOUNTER — Encounter (HOSPITAL_BASED_OUTPATIENT_CLINIC_OR_DEPARTMENT_OTHER): Payer: Self-pay | Admitting: *Deleted

## 2015-05-21 ENCOUNTER — Encounter (HOSPITAL_BASED_OUTPATIENT_CLINIC_OR_DEPARTMENT_OTHER)
Admission: RE | Disposition: A | Discharge status: Home / Self Care | Payer: Self-pay | Source: Ambulatory Visit | Attending: Urology

## 2015-05-21 DIAGNOSIS — K219 Gastro-esophageal reflux disease without esophagitis: Secondary | ICD-10-CM | POA: Diagnosis not present

## 2015-05-21 DIAGNOSIS — Z79899 Other long term (current) drug therapy: Secondary | ICD-10-CM | POA: Insufficient documentation

## 2015-05-21 DIAGNOSIS — E1151 Type 2 diabetes mellitus with diabetic peripheral angiopathy without gangrene: Secondary | ICD-10-CM | POA: Diagnosis not present

## 2015-05-21 DIAGNOSIS — I10 Essential (primary) hypertension: Secondary | ICD-10-CM | POA: Diagnosis not present

## 2015-05-21 DIAGNOSIS — Z87891 Personal history of nicotine dependence: Secondary | ICD-10-CM | POA: Insufficient documentation

## 2015-05-21 DIAGNOSIS — A630 Anogenital (venereal) warts: Secondary | ICD-10-CM | POA: Diagnosis not present

## 2015-05-21 DIAGNOSIS — Z794 Long term (current) use of insulin: Secondary | ICD-10-CM | POA: Diagnosis not present

## 2015-05-21 DIAGNOSIS — N4889 Other specified disorders of penis: Secondary | ICD-10-CM | POA: Insufficient documentation

## 2015-05-21 DIAGNOSIS — M109 Gout, unspecified: Secondary | ICD-10-CM | POA: Insufficient documentation

## 2015-05-21 DIAGNOSIS — E119 Type 2 diabetes mellitus without complications: Secondary | ICD-10-CM | POA: Diagnosis not present

## 2015-05-21 DIAGNOSIS — Z8551 Personal history of malignant neoplasm of bladder: Secondary | ICD-10-CM | POA: Diagnosis not present

## 2015-05-21 DIAGNOSIS — I739 Peripheral vascular disease, unspecified: Secondary | ICD-10-CM | POA: Diagnosis not present

## 2015-05-21 HISTORY — DX: Anogenital (venereal) warts: A63.0

## 2015-05-21 HISTORY — PX: CO2 LASER APPLICATION: SHX5778

## 2015-05-21 HISTORY — PX: CONDYLOMA EXCISION/FULGURATION: SHX1389

## 2015-05-21 HISTORY — DX: Personal history of other infectious and parasitic diseases: Z86.19

## 2015-05-21 LAB — GLUCOSE, CAPILLARY: Glucose-Capillary: 152 mg/dL — ABNORMAL HIGH (ref 65–99)

## 2015-05-21 LAB — POCT I-STAT 4, (NA,K, GLUC, HGB,HCT)
Glucose, Bld: 158 mg/dL — ABNORMAL HIGH (ref 65–99)
HCT: 52 % (ref 39.0–52.0)
Hemoglobin: 17.7 g/dL — ABNORMAL HIGH (ref 13.0–17.0)
Potassium: 3.9 mmol/L (ref 3.5–5.1)
Sodium: 142 mmol/L (ref 135–145)

## 2015-05-21 SURGERY — REMOVAL, CONDYLOMA
Anesthesia: General | Site: Penis

## 2015-05-21 MED ORDER — FENTANYL CITRATE (PF) 100 MCG/2ML IJ SOLN
25.0000 ug | INTRAMUSCULAR | Status: DC | PRN
  Filled 2015-05-21: qty 1

## 2015-05-21 MED ORDER — LIDOCAINE HCL (CARDIAC) 20 MG/ML IV SOLN
INTRAVENOUS | Status: DC | PRN
  Administered 2015-05-21: 80 mg via INTRAVENOUS

## 2015-05-21 MED ORDER — LACTATED RINGERS IV SOLN
INTRAVENOUS | Status: DC
  Administered 2015-05-21: 11:00:00 via INTRAVENOUS
  Filled 2015-05-21: qty 1000

## 2015-05-21 MED ORDER — FENTANYL CITRATE (PF) 100 MCG/2ML IJ SOLN
INTRAMUSCULAR | Status: DC | PRN
  Administered 2015-05-21: 25 ug via INTRAVENOUS
  Administered 2015-05-21: 50 ug via INTRAVENOUS

## 2015-05-21 MED ORDER — FENTANYL CITRATE (PF) 100 MCG/2ML IJ SOLN
INTRAMUSCULAR | Status: AC
  Filled 2015-05-21: qty 6

## 2015-05-21 MED ORDER — CEFAZOLIN SODIUM-DEXTROSE 2-3 GM-% IV SOLR
2.0000 g | INTRAVENOUS | Status: AC
  Administered 2015-05-21: 2 g via INTRAVENOUS
  Filled 2015-05-21: qty 50

## 2015-05-21 MED ORDER — PROPOFOL 10 MG/ML IV BOLUS
INTRAVENOUS | Status: DC | PRN
  Administered 2015-05-21: 180 mg via INTRAVENOUS

## 2015-05-21 MED ORDER — ONDANSETRON HCL 4 MG/2ML IJ SOLN
4.0000 mg | Freq: Once | INTRAMUSCULAR | Status: DC | PRN
  Filled 2015-05-21: qty 2

## 2015-05-21 MED ORDER — EPHEDRINE SULFATE 50 MG/ML IJ SOLN
INTRAMUSCULAR | Status: DC | PRN
  Administered 2015-05-21: 10 mg via INTRAVENOUS
  Administered 2015-05-21: 5 mg via INTRAVENOUS

## 2015-05-21 MED ORDER — ONDANSETRON HCL 4 MG/2ML IJ SOLN
INTRAMUSCULAR | Status: DC | PRN
  Administered 2015-05-21: 4 mg via INTRAVENOUS

## 2015-05-21 MED ORDER — MIDAZOLAM HCL 2 MG/2ML IJ SOLN
INTRAMUSCULAR | Status: AC
  Filled 2015-05-21: qty 2

## 2015-05-21 MED ORDER — MIDAZOLAM HCL 5 MG/5ML IJ SOLN
INTRAMUSCULAR | Status: DC | PRN
  Administered 2015-05-21: 2 mg via INTRAVENOUS

## 2015-05-21 MED ORDER — 0.9 % SODIUM CHLORIDE (POUR BTL) OPTIME
TOPICAL | Status: DC | PRN
  Administered 2015-05-21: 500 mL

## 2015-05-21 MED ORDER — BACITRACIN-NEOMYCIN-POLYMYXIN 400-5-5000 EX OINT
TOPICAL_OINTMENT | CUTANEOUS | Status: DC | PRN
  Administered 2015-05-21: 1 via TOPICAL

## 2015-05-21 MED ORDER — OXYCODONE HCL 10 MG PO TABS: 10.0000 mg | ORAL_TABLET | ORAL | Status: DC | PRN

## 2015-05-21 MED ORDER — CEFAZOLIN SODIUM-DEXTROSE 2-3 GM-% IV SOLR
INTRAVENOUS | Status: AC
  Filled 2015-05-21: qty 50

## 2015-05-21 SURGICAL SUPPLY — 35 items
BANDAGE CO FLEX L/F 1IN X 5YD (GAUZE/BANDAGES/DRESSINGS) IMPLANT
BANDAGE CO FLEX L/F 2IN X 5YD (GAUZE/BANDAGES/DRESSINGS) IMPLANT
BLADE CLIPPER SURG (BLADE) IMPLANT
BLADE SURG 15 STRL LF DISP TIS (BLADE) ×1 IMPLANT
BLADE SURG 15 STRL SS (BLADE) ×2
CLOTH BEACON ORANGE TIMEOUT ST (SAFETY) ×2 IMPLANT
COVER BACK TABLE 60X90IN (DRAPES) ×1 IMPLANT
COVER MAYO STAND STRL (DRAPES) IMPLANT
DRAPE EENT NEONATAL 1202 (DRAPE) IMPLANT
DRAPE PED LAPAROTOMY (DRAPES) IMPLANT
ELECT NDL TIP 2.8 STRL (NEEDLE) IMPLANT
ELECT NEEDLE TIP 2.8 STRL (NEEDLE) IMPLANT
ELECT REM PT RETURN 9FT ADLT (ELECTROSURGICAL)
ELECT REM PT RETURN 9FT PED (ELECTROSURGICAL)
ELECTRODE REM PT RETRN 9FT PED (ELECTROSURGICAL) IMPLANT
ELECTRODE REM PT RTRN 9FT ADLT (ELECTROSURGICAL) IMPLANT
GAUZE SPONGE 4X4 12PLY STRL (GAUZE/BANDAGES/DRESSINGS) ×1 IMPLANT
GLOVE BIO SURGEON STRL SZ8 (GLOVE) ×2 IMPLANT
GOWN W/COTTON TOWEL STD LRG (GOWNS) ×2 IMPLANT
GOWN XL W/COTTON TOWEL STD (GOWNS) ×2 IMPLANT
IV NS IRRIG 3000ML ARTHROMATIC (IV SOLUTION) IMPLANT
MANIFOLD NEPTUNE II (INSTRUMENTS) IMPLANT
NDL HYPO 25X1 1.5 SAFETY (NEEDLE) IMPLANT
NEEDLE HYPO 25X1 1.5 SAFETY (NEEDLE) IMPLANT
NS IRRIG 500ML POUR BTL (IV SOLUTION) ×2 IMPLANT
PACK BASIN DAY SURGERY FS (CUSTOM PROCEDURE TRAY) ×2 IMPLANT
PENCIL BUTTON HOLSTER BLD 10FT (ELECTRODE) ×2 IMPLANT
SUT CHROMIC 4 0 PS 2 18 (SUTURE) ×2 IMPLANT
SUT CHROMIC 4 0 RB 1X27 (SUTURE) ×2 IMPLANT
SUT CHROMIC 5 0 P 3 (SUTURE) ×3 IMPLANT
SYR CONTROL 10ML LL (SYRINGE) IMPLANT
TAPE CLOTH 2X10 TAN LF (GAUZE/BANDAGES/DRESSINGS) ×1 IMPLANT
TOWEL OR 17X24 6PK STRL BLUE (TOWEL DISPOSABLE) ×4 IMPLANT
WATER STERILE IRR 3000ML UROMA (IV SOLUTION) IMPLANT
WATER STERILE IRR 500ML POUR (IV SOLUTION) ×1 IMPLANT

## 2015-05-21 NOTE — Anesthesia Procedure Notes (Signed)
Procedure Name: LMA Insertion Date/Time: 05/21/2015 11:15 AM Performed by: Denna Haggard D Pre-anesthesia Checklist: Patient identified, Emergency Drugs available, Suction available and Patient being monitored Patient Re-evaluated:Patient Re-evaluated prior to inductionOxygen Delivery Method: Circle System Utilized Preoxygenation: Pre-oxygenation with 100% oxygen Intubation Type: IV induction Ventilation: Mask ventilation without difficulty LMA: LMA inserted LMA Size: 4.0 Number of attempts: 1 Airway Equipment and Method: Bite block Placement Confirmation: positive ETCO2 Tube secured with: Tape Dental Injury: Teeth and Oropharynx as per pre-operative assessment

## 2015-05-21 NOTE — H&P (Signed)
[name] is a 59 year old male with recurrent/persistent penile condyloma.  History of Present Illness     History of condyloma: He has had perianal condyloma as well as what sounds like penile condyloma in the past.    Penile edema: This was first evaluated by Dr. Gaynelle Arabian in 9/07. At that time is been present for 3 years. It had persisted. A CT scan revealed no abnormality of the pelvis. He reports having undergone what sounds like 3 surgeries for what he described as sweat gland but it sounds like he's referring to Hidradenitis suppurativa. He said he didn't have any problems with penile edema until he then underwent some form of plastic surgery.      Transitional cell carcinoma of the bladder: He experienced gross hematuria with no sign of infection and had a 45-pack-year smoking history with no abnormality noted on CT scan of the upper tract however a papillary tumor was noted within the bladder. He reports he took Actos for 9 years.  On 05/21/12 he underwent TURBT with instillation of postoperative mitomycin-C.  Pathology: Papillary TCCa (Ta,G1-2)    Interval history: No new urologic complaints are noted today other than some persistent penile warts.      Past Medical History Problems  1. History of Gout (M10.9) 2. History of esophageal reflux (Z87.19) 3. History of hidradenitis suppurativa (Z87.2) 4. History of type 2 diabetes mellitus (Z86.39) 5. 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 History of Bladder Injection Of Cancer Treatment 2. History of Cystoscopy With Fulguration Small Lesion (5-56mm) 3. History of Destruct Of Mollus Contagio By Any Method 15 Or More Lesions 4. History of Pilonidal Cyst Resection 5. History of Surgery Penis Destruction Of Lesion(S) Extensive  Current Meds 1. Albuterol 90 MCG/ACT AERS;  Therapy: (Recorded:04May2015) to Recorded 2. Calcium TABS;  Therapy: (Recorded:04May2015) to  Recorded 3. Clarinex 5 MG Oral Tablet;  Therapy: (Recorded:04May2015) to Recorded 4. HumaLOG 100 UNIT/ML Subcutaneous Solution;  Therapy: (Recorded:25Feb2016) to Recorded 5. Lisinopril-Hydrochlorothiazide 20-12.5 MG Oral Tablet;  Therapy: (Recorded:29Apr2009) to Recorded 6. Losartan Potassium-HCTZ 50-12.5 MG Oral Tablet;  Therapy: [phone] to Recorded 7. Pravastatin Sodium 40 MG Oral Tablet;  Therapy: (Recorded:29Apr2009) to Recorded 8. ProAir HFA 108 (90 Base) MCG/ACT Inhalation Aerosol Solution;  Therapy: [phone] to Recorded  Allergies Medication  1. No Known Drug Allergies  Family History Problems  1. Family history of Lung Cancer : Father  Social History Problems  1. Denied: History of Alcohol Use 2. Caffeine Use 3. Former smoker (Z87.891)   1 ppd x 40 years 4. Marital History - Currently Married    Review of Systems Genitourinary, constitutional, skin, eye, otolaryngeal, hematologic/lymphatic, cardiovascular, pulmonary, endocrine, musculoskeletal, gastrointestinal, neurological and psychiatric system(s) were reviewed and pertinent findings if present are noted.  Genitourinary: urinary frequency, nocturia, hematuria and penile edema.  Gastrointestinal: heartburn.  Integumentary: skin rash/lesion.  Eyes: blurred vision.  ENT: sinus problems.  Respiratory: cough.  Endocrine: polydipsia.  Musculoskeletal: back pain and joint pain.   Vitals Vital Signs  Height: 5 ft 11 in Weight: 191 lb  BMI Calculated: 26.64 BSA Calculated: 2.07 Blood Pressure: 140 / 80 Heart Rate: 84   Physical Exam Constitutional: Well nourished and well developed . No acute distress.  ENT:. The ears and nose are normal in appearance.  Neck: The appearance of the neck is normal and no neck mass is present.  Pulmonary: No respiratory distress and normal respiratory rhythm and effort.  Cardiovascular: Heart rate and rhythm are  normal . No peripheral edema.  Abdomen: The abdomen is soft  and nontender. No masses are palpated. No CVA tenderness. No hernias are palpable. No hepatosplenomegaly noted.  Rectal: Rectal exam demonstrates normal sphincter tone, no tenderness and no masses. The prostate has no nodularity and is not tender. The left seminal vesicle is nonpalpable. The right seminal vesicle is nonpalpable. The perineum is normal on inspection.  Genitourinary: Continues to have penile edema that is unchanged. He has 3 new condylomatous lesions associated with the distal aspect of the foreskin dorsally.  The scrotum is without lesions. The right epididymis is palpably normal and non-tender. The left epididymis is palpably normal and non-tender. The right testis is non-tender and without masses. The left testis is non-tender and without masses.  Lymphatics: The femoral and inguinal nodes are not enlarged or tender.  Skin: Normal skin turgor, no visible rash and no visible skin lesions.  Neuro/Psych:. Mood and affect are appropriate.    Results/Data  The following clinical lab reports were reviewed:  UA: No significant cellular elements in his urine today.    Procedure  Procedure: Cystoscopy done on 05/10/15  Indication: History of Urothelial Carcinoma.  Informed Consent: Risks, benefits, and potential adverse events were discussed and informed consent was obtained from the patient.  Prep: The patient was prepped with betadine.  Anesthesia:. Local anesthesia was administered intraurethrally with 2% lidocaine jelly.  Procedure Note:  Urethral meatus:. No abnormalities.  Anterior urethra: No abnormalities.  Prostatic urethra: No abnormalities.  Bladder: Visulization was clear. The ureteral orifices were in the normal anatomic position bilaterally and had clear efflux of urine. A systematic survey of the bladder demonstrated no bladder tumors or stones. The mucosa was smooth without abnormalities. Examination of the bladder demonstrated mild trabeculation. An old scar is noted  on the floor of the bladder on the right-hand side. The patient tolerated the procedure well.  Complications: None.    Assessment   His penile edema has remained stable. I found that he did have some condyloma present on the penis. We therefore discussed treating these with CO2 laser.        He had no evidence of recurrent transitional cell carcinoma of the bladder cystoscopically again today.   Plan   1. He'll be scheduled for outpatient CO2 laser of his penile condyloma.

## 2015-05-21 NOTE — Discharge Instructions (Signed)
Postoperative instructions for condyloma ablation  Wound: The areas treated will be somewhat similar to a burn.  They need to be treated in a similar fashion.  Your dressing should remain in place for 48 hours and then can be removed.  I would recommend using Neosporin or Neosporin plus which has a mild local anesthetic applied at least 2 times a day and more if you should so choose.   Diet:  You may return to your normal diet within 24 hours following your surgery. You may note some mild nausea and possibly vomiting the first 6-8 hours following surgery. This is usually due to the side effects of anesthesia, and will disappear quite soon. I would suggest clear liquids and a very light meal the first evening following your surgery.  Activity:  Your physical activity should be restricted the first 48 hours. During that time you should remain relatively inactive, moving about only when necessary. During the first 7-10 days following surgery he should avoid lifting any heavy objects (anything greater than 15 pounds), and avoid strenuous exercise. If you work, ask [place] specifically about your restrictions, both for work and home. We will write a note to your employer if needed.  Ice packs can be placed on and off over the penis for the first 48 hours to help relieve the pain and keep the swelling down.   Hygiene:  You may shower 48 hours after your surgery. Tub bathing should be restricted until the seventh day.  Medication:  You will be sent home with some type of pain medication. In many cases you will be sent home with a narcotic pain pill (Vicodin or Tylox). If the pain is not too bad, you may take either Tylenol (acetaminophen) or Advil (ibuprofen) which contain no narcotic agents, and might be tolerated a little better, with fewer side effects. If the pain medication you are sent home with does not control the pain, you will have to let us know. Some narcotic pain medications cannot be given or  refilled by a phone call to a pharmacy.  Problems you should report to [place]:   Fever of 101.0 degrees Fahrenheit or greater.  Moderate or severe swelling under the skin incision or involving the scrotum.  Drug reaction such as hives, a rash, nausea or vomiting.    Post Anesthesia Home Care Instructions  Activity: Get plenty of rest for the remainder of the day. A responsible adult should stay with you for 24 hours following the procedure.  For the next 24 hours, DO NOT: -Drive a car -Paediatric nurse -Drink alcoholic beverages -Take any medication unless instructed by your physician -Make any legal decisions or sign important papers.  Meals: Start with liquid foods such as gelatin or soup. Progress to regular foods as tolerated. Avoid greasy, spicy, heavy foods. If nausea and/or vomiting occur, drink only clear liquids until the nausea and/or vomiting subsides. Call your physician if vomiting continues.  Special Instructions/Symptoms: Your throat may feel dry or sore from the anesthesia or the breathing tube placed in your throat during surgery. If this causes discomfort, gargle with warm salt water. The discomfort should disappear within 24 hours.  If you had a scopolamine patch placed behind your ear for the management of post- operative nausea and/or vomiting:  1. The medication in the patch is effective for 72 hours, after which it should be removed.  Wrap patch in a tissue and discard in the trash. Wash hands thoroughly with soap and water. 2. You  earlier than 72 hours if you experience unpleasant side effects which may include dry mouth, dizziness or visual disturbances. 3. Avoid touching the patch. Wash your hands with soap and water after contact with the patch.   

## 2015-05-21 NOTE — Anesthesia Preprocedure Evaluation (Addendum)
Anesthesia Evaluation  Patient identified by MRN, date of birth, ID band Patient awake    Reviewed: Allergy & Precautions, NPO status , Patient's Chart, lab work & pertinent test results  Airway Mallampati: I  TM Distance: >3 FB Neck ROM: Full   Comment: Higinio Plan is present Dental  (+) Poor Dentition, Dental Advisory Given   Pulmonary Current Smoker,  Smokes currently   Pulmonary exam normal breath sounds clear to auscultation       Cardiovascular hypertension, Pt. on medications + Peripheral Vascular Disease  Normal cardiovascular exam Rhythm:Regular Rate:Normal  On cilostazol for lower extremity peripheral vascular disease   Neuro/Psych negative neurological ROS  negative psych ROS   GI/Hepatic negative GI ROS, Neg liver ROS,   Endo/Other  diabetes, Insulin Dependent  Renal/GU negative Renal ROS  negative genitourinary   Musculoskeletal negative musculoskeletal ROS (+)   Abdominal   Peds negative pediatric ROS (+)  Hematology negative hematology ROS (+)   Anesthesia Other Findings   Reproductive/Obstetrics negative OB ROS                            Anesthesia Physical  Anesthesia Plan  ASA: III  Anesthesia Plan: General   Post-op Pain Management:    Induction: Intravenous  Airway Management Planned: LMA  Additional Equipment:   Intra-op Plan:   Post-operative Plan: Extubation in OR  Informed Consent: I have reviewed the patients History and Physical, chart, labs and discussed the procedure including the risks, benefits and alternatives for the proposed anesthesia with the patient or authorized representative who has indicated his/her understanding and acceptance.   Dental advisory given  Plan Discussed with: CRNA, Surgeon and Anesthesiologist  Anesthesia Plan Comments:         Anesthesia Quick Evaluation

## 2015-05-21 NOTE — Anesthesia Postprocedure Evaluation (Signed)
  Anesthesia Post-op Note  Patient: [name]  Procedure(s) Performed: Procedure(s) (LRB): CONDYLOMA REMOVAL (N/A) CO2 LASER APPLICATION (N/A)  Patient Location: PACU  Anesthesia Type: General  Level of Consciousness: awake and alert   Airway and Oxygen Therapy: Patient Spontanous Breathing  Post-op Pain: mild  Post-op Assessment: Post-op Vital signs reviewed, Patient's Cardiovascular Status Stable, Respiratory Function Stable, Patent Airway and No signs of Nausea or vomiting  Last Vitals:  Filed Vitals:   05/21/15 1230  BP: 126/58  Pulse: 74  Temp:   Resp: 22    Post-op Vital Signs: stable   Complications: No apparent anesthesia complications

## 2015-05-21 NOTE — Transfer of Care (Signed)
Immediate Anesthesia Transfer of Care Note  Patient: [name]  Procedure(s) Performed: Procedure(s) (LRB): CONDYLOMA REMOVAL (N/A) CO2 LASER APPLICATION (N/A)  Patient Location: PACU  Anesthesia Type: General  Level of Consciousness: awake, oriented, sedated and patient cooperative  Airway & Oxygen Therapy: Patient Spontanous Breathing and Patient connected to face mask oxygen  Post-op Assessment: Report given to PACU RN and Post -op Vital signs reviewed and stable  Post vital signs: Reviewed and stable  Complications: No apparent anesthesia complications

## 2015-05-21 NOTE — Op Note (Signed)
PATIENT:  [name]  PRE-OPERATIVE DIAGNOSIS: Penile condyloma    POST-OPERATIVE DIAGNOSIS: Same  PROCEDURE: CO2 laser ablation of penile condyloma (1.2 cm)  SURGEON:  Claybon Jabs  INDICATION: JARELL MCEWEN is a 59 year old male with a history of genital condyloma. He was noted to have recurrent condylomatous lesions on the penis recently. There size was greater than what I thought would respond to topical pharmacologic therapy and therefore we discussed treatment with CO2 laser fulguration.  ANESTHESIA:  General  EBL:  None  DRAINS: None  LOCAL MEDICATIONS USED:  None  SPECIMEN:  None  Description of procedure: After informed consent the patient was taken to the operating room and placed on the table in a supine position. General anesthesia was then administered. An official timeout was then performed.  Examination revealed the largest area of condylomatous lesions was located on the distal shaft skin at the 12:00 position measuring 1.2 cm. A second area lateral to this on the right-hand side was also noted. No other lesions were noted along the shaft proximally or on the scrotum nor were there any lesions involving the glans.   The CO2 laser set at 59 W was then used to fulgurate all visible lesions. These areas were then treated by applying Neosporin and a loosely wrapped gauze dressing and the patient was awakened and taken to the recovery room in stable and satisfactory condition. He tolerated procedure well with no intraoperative complications.  PLAN OF CARE: Discharge to home after PACU  PATIENT DISPOSITION:  PACU - hemodynamically stable.

## 2015-05-22 ENCOUNTER — Encounter (HOSPITAL_BASED_OUTPATIENT_CLINIC_OR_DEPARTMENT_OTHER): Payer: Self-pay | Admitting: Urology

## 2015-05-28 DIAGNOSIS — A630 Anogenital (venereal) warts: Secondary | ICD-10-CM | POA: Diagnosis not present

## 2015-06-05 ENCOUNTER — Telehealth: Payer: Self-pay | Admitting: *Deleted

## 2015-06-05 MED ORDER — HYDROCODONE-ACETAMINOPHEN 10-325 MG PO TABS: 1.0000 | ORAL_TABLET | ORAL | Status: DC | PRN

## 2015-06-05 NOTE — Telephone Encounter (Signed)
Patient advised rx for his pain medication is ready for pick up. Rx placed up front.

## 2015-06-05 NOTE — Telephone Encounter (Signed)
Patients wife called, she said her husband needs a refill of his hydrocodone, please call them when it's ready to be picked up.

## 2015-06-05 NOTE — Telephone Encounter (Signed)
See note below and please advise, Thanks! 

## 2015-06-05 NOTE — Telephone Encounter (Signed)
i printed 

## 2015-06-05 NOTE — Addendum Note (Signed)
Addended by: [name] on: 06/05/2015 01:58 PM   Modules accepted: Orders, Medications

## 2015-07-02 ENCOUNTER — Encounter: Payer: Self-pay | Admitting: Endocrinology

## 2015-07-02 ENCOUNTER — Ambulatory Visit (INDEPENDENT_AMBULATORY_CARE_PROVIDER_SITE_OTHER): Payer: Medicare Other | Admitting: Endocrinology

## 2015-07-02 VITALS — BP 170/80 | HR 77 | Temp 97.9°F | Ht 68.25 in | Wt 201.0 lb

## 2015-07-02 DIAGNOSIS — Z23 Encounter for immunization: Secondary | ICD-10-CM | POA: Diagnosis not present

## 2015-07-02 DIAGNOSIS — E1051 Type 1 diabetes mellitus with diabetic peripheral angiopathy without gangrene: Secondary | ICD-10-CM

## 2015-07-02 LAB — POCT GLYCOSYLATED HEMOGLOBIN (HGB A1C): Hemoglobin A1C: 8.9

## 2015-07-02 MED ORDER — HYDROCODONE-ACETAMINOPHEN 10-325 MG PO TABS: 1.0000 | ORAL_TABLET | ORAL | Status: DC | PRN

## 2015-07-02 MED ORDER — INSULIN NPH (HUMAN) (ISOPHANE) 100 UNIT/ML ~~LOC~~ SUSP: SUBCUTANEOUS | Status: DC

## 2015-07-02 NOTE — Progress Notes (Signed)
Subjective:    Patient ID: [name], male    DOB: [birth year], 59 y.o.   MRN: [id number]  HPI Pt returns for f/u of diabetes mellitus: DM type: Insulin-requiring type 2 Dx'ed: 0000000 Complications: PAD and polyneuropathy Therapy: insulin since 2009 DKA: never Severe hypoglycemia: never Pancreatitis: never Other: he has been on a simple BID insulin regimen, after poor results with multiple daily injections; he has requested inexpensive human insulin, as he does not have a medicare part-D plan.   Interval history: He has again reduced the am insulin to 30 units.  Meter is downloaded today, and the printout is scanned into the record.  cbg's vary from 70-400.  It is in general higher as the day goes on, but not necessarily so.  He eats supper at 10-11 PM.   Past Medical History  Diagnosis Date  . Diabetic neuropathy ([place])   . Smokers' cough ([place])   . Productive cough   . GERD (gastroesophageal reflux disease)   . Hyperlipidemia   . Allergic rhinitis   . History of bladder cancer     s/p  turbt  2013/   transitional cell carcinoma--   . Hypertension   . Lower urinary tract symptoms (LUTS)   . Type 2 diabetes mellitus with insulin therapy ([place])     monitor by  dr ellsion  . History of gout   . Wears dentures     upper  . At risk for sleep apnea     STOP-BANG= 5   SENT TO PCP 03-14-2014  . PVD (peripheral vascular disease) with claudication ([facility])     bilateral SFA disease-- right > left  and left tibial artery disease--  per duplex  . History of condyloma acuminatum     PERINEAL AREA  W/ RECURRENCY  . Condyloma acuminatum of penis   . Arthritis     Past Surgical History  Procedure Laterality Date  . Laser ablation of penile and perianal warts  07-29-2007  Dr. Johney Maine  . Left shoulder surgery  2003  . Cataract extraction w/ intraocular lens implant Right   . Transurethral resection of bladder tumor  05/21/2012    Procedure: TRANSURETHRAL RESECTION OF BLADDER TUMOR (TURBT);   Surgeon: Claybon Jabs, MD;  Location: [facility];  Service: Urology;  Laterality: N/A;     . Inguinal hidradenitis excision  1998, 1999  . Perineal hidradenitis excision  1998, 1999  . Axillary hidradenitis excision  1997  . Laser ablation condolamata N/A 03/20/2014    Procedure: EXAM UNDER ANESTHESIA, REMOVAL/ABLATION OF CONDYLOMATA PENIS,GROINS, ANUS, ANAL CANAL;  Surgeon: [name], MD;  Location: [place] [place];  Service: General;  Laterality: N/A;  groin and anus  . Co2 laser application N/A 0000000    Procedure: CO2 LASER APPLICATION,PENIS, GROIN, ANUS;  Surgeon: [name], MD;  Location: [facility];  Service: General;  Laterality: N/A;  . Cardiovascular stress test  07-24-2014  dr Kathlyn [place]    Low risk lexiscan nuclear study with apical thinning and small inferolateral wall infarct at mid & basal level , no ischemia/  normal LVF and wall motion , ef 59%  . Laser ablation condolamata N/A 10/24/2014    Procedure: LASER ABLATION CONDOLAMATA;  Surgeon: Michael Boston, MD;  Location: [facility];  Service: General;  Laterality: N/A;  . Mass excision N/A 10/24/2014    Procedure: EXCISION OF PERINEAL MASS/SINUS;  Surgeon: Michael Boston, MD;  Location: [facility];  Service: General;  Laterality: N/A;  . Hemorrhoid surgery  10/24/2014    Procedure: HEMORRHOIDECTOMY;  Surgeon: Michael Boston, MD;  Location: [facility];  Service: General;;  . Incision and drainage abscess Left 10/24/2014    Procedure: INCISION AND DRAINAGE ABSCESS;  Surgeon: Michael Boston, MD;  Location: [facility];  Service: General;  Laterality: Left;  . Condyloma excision/fulguration N/A 05/21/2015    Procedure: CONDYLOMA REMOVAL;  Surgeon: Kathie Rhodes, MD;  Location: [facility];  Service: Urology;  Laterality: N/A;  . Co2 laser application N/A [id number]    Procedure: CO2 LASER APPLICATION;   Surgeon: Kathie Rhodes, MD;  Location: [facility];  Service: Urology;  Laterality: N/A;    Social History   Social History  . Marital Status: Married    Spouse Name: N/A  . Number of Children: N/A  . Years of Education: N/A   Occupational History  . Disabled    Social History Main Topics  . Smoking status: Current Every Day Smoker -- 1.00 packs/day for 41 years    Types: Cigars, Cigarettes  . Smokeless tobacco: Never Used  . Alcohol Use: No  . Drug Use: No  . Sexual Activity: Not on file   Other Topics Concern  . Not on file   Social History Narrative    Current Outpatient Prescriptions on File Prior to Visit  Medication Sig Dispense Refill  . atorvastatin (LIPITOR) 20 MG tablet Take 1 tablet (20 mg total) by mouth daily. 90 tablet 1  . cilostazol (PLETAL) 100 MG tablet Take 1 tablet (100 mg total) by mouth 2 (two) times daily. 60 tablet 6  . losartan-hydrochlorothiazide (HYZAAR) 100-12.5 MG per tablet Take 1 tablet by mouth daily. (Patient taking differently: Take 1 tablet by mouth every evening. ) 30 tablet 11  . albuterol (PROVENTIL HFA;VENTOLIN HFA) 108 (90 BASE) MCG/ACT inhaler Inhale 2 puffs into the lungs every 6 (six) hours as needed for wheezing. (Patient not taking: Reported on 07/02/2015) 1 Inhaler 2   No current facility-administered medications on file prior to visit.    No Known Allergies  Family History  Problem Relation Age of Onset  . Colon cancer Neg Hx   . Esophageal cancer Neg Hx   . Pancreatic cancer Neg Hx   . Prostate cancer Neg Hx   . Kidney disease Neg Hx   . Liver disease Neg Hx   . Lung cancer Neg Hx   . Diabetes Maternal Aunt     x 2  . Hypertension Mother   . Cancer Father     lung ca    BP 170/80 mmHg  Pulse 77  Temp(Src) 97.9 F (36.6 C) (Oral)  Ht 5' 8.25" (1.734 m)  Wt 201 lb (91.173 kg)  BMI 30.32 kg/m2  SpO2 97%  Review of Systems He denies hypoglycemia.      Objective:   Physical Exam VITAL  SIGNS: See vs page. GENERAL: no distress.  Pulses: dorsalis pedis intact bilat.  MSK: no deformity of the feet. CV: no leg edema. Skin: no ulcer on the feet. normal color and temp on the feet.  Neuro: sensation is intact to touch on the feet, but decreased from normal Ext; There is bilateral onychomycosis of the toenails.    A1c=8.9%    Assessment & Plan:  DM: he needs increased rx.  Patient is advised the following: Patient Instructions  Please increase the insulin to 40 units each morning, and 25  units each evening.  On this type of insulin schedule, you should eat meals on a regular schedule.  If a meal is missed or significantly delayed, your blood sugar could go low.  If necessary, eat a light snack if a meal is delayed.   check your blood sugar twice a day.  vary the time of day when you check, between before the 3 meals, and at bedtime.  also check if you have symptoms of your blood sugar being too high or too low.  please keep a record of the readings and bring it to your next appointment here.  You can write it on any piece of paper.  please call us sooner if your blood sugar goes below 70, or if you have a lot of readings over 200.  Please come back for a follow-up appointment in 3 months.

## 2015-07-02 NOTE — Patient Instructions (Addendum)
Please increase the insulin to 40 units each morning, and 25 units each evening.  On this type of insulin schedule, you should eat meals on a regular schedule.  If a meal is missed or significantly delayed, your blood sugar could go low.  If necessary, eat a light snack if a meal is delayed.   check your blood sugar twice a day.  vary the time of day when you check, between before the 3 meals, and at bedtime.  also check if you have symptoms of your blood sugar being too high or too low.  please keep a record of the readings and bring it to your next appointment here.  You can write it on any piece of paper.  please call us sooner if your blood sugar goes below 70, or if you have a lot of readings over 200.  Please come back for a follow-up appointment in 3 months.

## 2015-07-09 DIAGNOSIS — L732 Hidradenitis suppurativa: Secondary | ICD-10-CM | POA: Diagnosis not present

## 2015-07-09 DIAGNOSIS — Z720 Tobacco use: Secondary | ICD-10-CM | POA: Diagnosis not present

## 2015-07-09 DIAGNOSIS — A630 Anogenital (venereal) warts: Secondary | ICD-10-CM | POA: Diagnosis not present

## 2015-08-01 ENCOUNTER — Telehealth: Payer: Self-pay | Admitting: Endocrinology

## 2015-08-01 MED ORDER — HYDROCODONE-ACETAMINOPHEN 10-325 MG PO TABS: 1.0000 | ORAL_TABLET | ORAL | Status: DC | PRN

## 2015-08-01 NOTE — Telephone Encounter (Signed)
See note below,  thanks 

## 2015-08-01 NOTE — Telephone Encounter (Signed)
I contacted the pt and advised rx is ready for pick up. Rx placed up front.  

## 2015-08-01 NOTE — Telephone Encounter (Signed)
Pt needs hydrocodone rx please °

## 2015-08-01 NOTE — Telephone Encounter (Signed)
i printed 

## 2015-08-12 HISTORY — PX: MOHS SURGERY: SHX181

## 2015-09-03 ENCOUNTER — Telehealth: Payer: Self-pay | Admitting: Endocrinology

## 2015-09-03 MED ORDER — HYDROCODONE-ACETAMINOPHEN 10-325 MG PO TABS: 1.0000 | ORAL_TABLET | ORAL | Status: DC | PRN

## 2015-09-03 NOTE — Telephone Encounter (Signed)
See note below and please advise, Thanks! 

## 2015-09-03 NOTE — Telephone Encounter (Signed)
Patient called stating that he would like a refill on his pain medication    Thank you

## 2015-09-03 NOTE — Telephone Encounter (Signed)
Patient advised rx is ready for pick up. Rx placed upfront.  

## 2015-09-03 NOTE — Telephone Encounter (Signed)
i printed 

## 2015-09-05 ENCOUNTER — Encounter: Payer: Self-pay | Admitting: Gastroenterology

## 2015-09-13 ENCOUNTER — Other Ambulatory Visit: Payer: Self-pay | Admitting: Cardiovascular Disease

## 2015-10-01 ENCOUNTER — Ambulatory Visit (INDEPENDENT_AMBULATORY_CARE_PROVIDER_SITE_OTHER): Payer: Medicare Other | Admitting: Endocrinology

## 2015-10-01 ENCOUNTER — Encounter: Payer: Self-pay | Admitting: Endocrinology

## 2015-10-01 VITALS — BP 138/78 | HR 91 | Temp 98.2°F | Ht 68.25 in | Wt 205.0 lb

## 2015-10-01 DIAGNOSIS — E1151 Type 2 diabetes mellitus with diabetic peripheral angiopathy without gangrene: Secondary | ICD-10-CM

## 2015-10-01 DIAGNOSIS — Z794 Long term (current) use of insulin: Secondary | ICD-10-CM | POA: Diagnosis not present

## 2015-10-01 LAB — POCT GLYCOSYLATED HEMOGLOBIN (HGB A1C): HEMOGLOBIN A1C: 10

## 2015-10-01 MED ORDER — INSULIN NPH (HUMAN) (ISOPHANE) 100 UNIT/ML ~~LOC~~ SUSP: SUBCUTANEOUS | Status: DC

## 2015-10-01 MED ORDER — HYDROCODONE-ACETAMINOPHEN 10-325 MG PO TABS: 1.0000 | ORAL_TABLET | ORAL | Status: DC | PRN

## 2015-10-01 MED ORDER — ATORVASTATIN CALCIUM 20 MG PO TABS: 20.0000 mg | ORAL_TABLET | Freq: Every day | ORAL | Status: DC

## 2015-10-01 NOTE — Patient Instructions (Addendum)
Please increase the insulin to 50 units with breakfast, and 40 units with the evening meal. Please continue the same medication for blood pressure.  i have also sent a prescription to your pharmacy, to refill the generic lipitor.  Please come back for a regular physical appointment in 2 months.

## 2015-10-01 NOTE — Progress Notes (Signed)
Subjective:    Patient ID: [name], male    DOB: November 10, 1955, 60 y.o.   MRN: [id number]  HPI Pt returns for f/u of diabetes mellitus: DM type: Insulin-requiring type 2 Dx'ed: 0000000 Complications: PAD and polyneuropathy.   Therapy: insulin since 2009 DKA: never Severe hypoglycemia: never Pancreatitis: never Other: he has been on a simple BID insulin regimen, after poor results with multiple daily injections; he has requested inexpensive human insulin, as he does not have a medicare part-D plan.   Interval history: He says he never misses the insulin.  no cbg record, but states cbg's vary from 80-210, but most are mostly in the mid-100's.  There is no trend throughout the day.   He does not take lipitor, but he does take hyzaar.   Past Medical History  Diagnosis Date  . Diabetic neuropathy ([place])   . Smokers' cough ([place])   . Productive cough   . GERD (gastroesophageal reflux disease)   . Hyperlipidemia   . Allergic rhinitis   . History of bladder cancer     s/p  turbt  2013/   transitional cell carcinoma--   . Hypertension   . Lower urinary tract symptoms (LUTS)   . Type 2 diabetes mellitus with insulin therapy ([place])     monitor by  dr ellsion  . History of gout   . Wears dentures     upper  . At risk for sleep apnea     STOP-BANG= 5   SENT TO PCP 03-14-2014  . PVD (peripheral vascular disease) with claudication (HCC)     bilateral SFA disease-- right > left  and left tibial artery disease--  per duplex  . History of condyloma acuminatum     PERINEAL AREA  W/ RECURRENCY  . Condyloma acuminatum of penis   . Arthritis     Past Surgical History  Procedure Laterality Date  . Laser ablation of penile and perianal warts  07-29-2007  Dr. Johney Maine  . Left shoulder surgery  2003  . Cataract extraction w/ intraocular lens implant Right   . Transurethral resection of bladder tumor  05/21/2012    Procedure: TRANSURETHRAL RESECTION OF BLADDER TUMOR (TURBT);  Surgeon: Claybon Jabs, MD;  Location: [facility];  Service: Urology;  Laterality: N/A;     . Inguinal hidradenitis excision  1998, 1999  . Perineal hidradenitis excision  1998, 1999  . Axillary hidradenitis excision  1997  . Laser ablation condolamata N/A 03/20/2014    Procedure: EXAM UNDER ANESTHESIA, REMOVAL/ABLATION OF CONDYLOMATA PENIS,GROINS, ANUS, ANAL CANAL;  Surgeon: [name], MD;  Location: [facility];  Service: General;  Laterality: N/A;  groin and anus  . Co2 laser application N/A 0000000    Procedure: CO2 LASER APPLICATION,PENIS, GROIN, ANUS;  Surgeon: [name], MD;  Location: [facility];  Service: General;  Laterality: N/A;  . Cardiovascular stress test  07-24-2014  dr Kathlyn [place]    Low risk lexiscan nuclear study with apical thinning and small inferolateral wall infarct at mid & basal level , no ischemia/  normal LVF and wall motion , ef 59%  . Laser ablation condolamata N/A 10/24/2014    Procedure: LASER ABLATION CONDOLAMATA;  Surgeon: Michael Boston, MD;  Location: [facility];  Service: General;  Laterality: N/A;  . Mass excision N/A 10/24/2014    Procedure: EXCISION OF PERINEAL MASS/SINUS;  Surgeon: Michael Boston, MD;  Location: [facility];  Service:  General;  Laterality: N/A;  . Hemorrhoid surgery  10/24/2014    Procedure: HEMORRHOIDECTOMY;  Surgeon: Michael Boston, MD;  Location: [facility];  Service: General;;  . Incision and drainage abscess Left 10/24/2014    Procedure: INCISION AND DRAINAGE ABSCESS;  Surgeon: Michael Boston, MD;  Location: [facility];  Service: General;  Laterality: Left;  . Condyloma excision/fulguration N/A 05/21/2015    Procedure: CONDYLOMA REMOVAL;  Surgeon: Kathie Rhodes, MD;  Location: [facility];  Service: Urology;  Laterality: N/A;  . Co2 laser application N/A [id number]    Procedure: CO2 LASER APPLICATION;  Surgeon: Kathie Rhodes, MD;  Location: [facility];  Service: Urology;  Laterality: N/A;    Social History   Social History  . Marital Status: Married    Spouse Name: N/A  . Number of Children: N/A  . Years of Education: N/A   Occupational History  . Disabled    Social History Main Topics  . Smoking status: Current Every Day Smoker -- 1.00 packs/day for 41 years    Types: Cigars, Cigarettes  . Smokeless tobacco: Never Used  . Alcohol Use: No  . Drug Use: No  . Sexual Activity: Not on file   Other Topics Concern  . Not on file   Social History Narrative    Current Outpatient Prescriptions on File Prior to Visit  Medication Sig Dispense Refill  . albuterol (PROVENTIL HFA;VENTOLIN HFA) 108 (90 BASE) MCG/ACT inhaler Inhale 2 puffs into the lungs every 6 (six) hours as needed for wheezing. 1 Inhaler 2  . cilostazol (PLETAL) 100 MG tablet TAKE ONE TABLET BY MOUTH TWICE DAILY 60 tablet 3  . losartan-hydrochlorothiazide (HYZAAR) 100-12.5 MG per tablet Take 1 tablet by mouth daily. (Patient taking differently: Take 1 tablet by mouth every evening. ) 30 tablet 11   No current facility-administered medications on file prior to visit.    No Known Allergies  Family History  Problem Relation Age of Onset  . Colon cancer Neg Hx   . Esophageal cancer Neg Hx   . Pancreatic cancer Neg Hx   . Prostate cancer Neg Hx   . Kidney disease Neg Hx   . Liver disease Neg Hx   . Lung cancer Neg Hx   . Diabetes Maternal Aunt     x 2  . Hypertension Mother   . Cancer Father     lung ca   BP 138/78 mmHg  Pulse 91  Temp(Src) 98.2 F (36.8 C) (Oral)  Ht 5' 8.25" (1.734 m)  Wt 205 lb (92.987 kg)  BMI 30.93 kg/m2  SpO2 95%  Review of Systems He denies hypoglycemia    Objective:   Physical Exam VITAL SIGNS:  See vs page GENERAL: no distress Pulses: dorsalis pedis intact bilat.  MSK: no deformity of the feet. CV: trace bilat leg edema, and bilat varicosities.  Skin: no ulcer on  the feet. normal color and temp on the feet.  Neuro: sensation is intact to touch on the feet, but decreased from normal.  Ext; There is bilateral onychomycosis of the toenails.    Lab Results  Component Value Date   HGBA1C 10.0 10/01/2015      Assessment & Plan:  DM: worse Dyslipidemia: therapy limited by noncompliance.  i'll do the best i can. HTN: well-controlled.  continue the same medication.    Patient is advised the following: Patient Instructions  Please increase the insulin to 50 units with breakfast, and 40 units  with the evening meal. Please continue the same medication for blood pressure.  i have also sent a prescription to your pharmacy, to refill the generic lipitor.  Please come back for a regular physical appointment in 2 months.

## 2015-10-02 ENCOUNTER — Telehealth: Payer: Self-pay | Admitting: Endocrinology

## 2015-10-02 DIAGNOSIS — E119 Type 2 diabetes mellitus without complications: Secondary | ICD-10-CM | POA: Insufficient documentation

## 2015-10-02 NOTE — Telephone Encounter (Signed)
Pt wife said that he left some prescriptions here when he came for his appt?

## 2015-10-02 NOTE — Telephone Encounter (Signed)
I contacted the pt and advise His hydrocodone rx was left here. Pt was advised to come back by the office and pick the medication up. Rx placed upfront.

## 2015-10-03 DIAGNOSIS — E109 Type 1 diabetes mellitus without complications: Secondary | ICD-10-CM | POA: Diagnosis not present

## 2015-10-15 ENCOUNTER — Other Ambulatory Visit: Payer: Self-pay | Admitting: Endocrinology

## 2015-10-18 DIAGNOSIS — N4889 Other specified disorders of penis: Secondary | ICD-10-CM | POA: Diagnosis not present

## 2015-10-18 DIAGNOSIS — Z0000 Encounter for general adult medical examination without abnormal findings: Secondary | ICD-10-CM | POA: Diagnosis not present

## 2015-10-18 DIAGNOSIS — Z8551 Personal history of malignant neoplasm of bladder: Secondary | ICD-10-CM | POA: Diagnosis not present

## 2015-11-02 ENCOUNTER — Telehealth: Payer: Self-pay | Admitting: Endocrinology

## 2015-11-02 MED ORDER — HYDROCODONE-ACETAMINOPHEN 10-325 MG PO TABS: 1.0000 | ORAL_TABLET | ORAL | Status: DC | PRN

## 2015-11-02 NOTE — Telephone Encounter (Signed)
I contacted the pt and advised rx is ready for pick up. Rx placed up front.  

## 2015-11-02 NOTE — Telephone Encounter (Signed)
Pt needs Rx for his Hydrocodone

## 2015-11-02 NOTE — Telephone Encounter (Signed)
See note below and please advise, Thanks! 

## 2015-11-02 NOTE — Telephone Encounter (Signed)
i printed 

## 2015-11-13 ENCOUNTER — Telehealth: Payer: Self-pay | Admitting: Endocrinology

## 2015-11-13 NOTE — Telephone Encounter (Signed)
Pt wife called in asking if some forms that were dropped off last week are completed.

## 2015-11-13 NOTE — Telephone Encounter (Signed)
I contacted the pt. Pt stated he needs to have his [employer] form filled out for the year

## 2015-11-13 NOTE — Telephone Encounter (Signed)
done

## 2015-11-13 NOTE — Telephone Encounter (Signed)
See note below and please advise, Thanks! 

## 2015-11-13 NOTE — Telephone Encounter (Signed)
Pt notified form is ready for pickup.

## 2015-11-18 ENCOUNTER — Other Ambulatory Visit: Payer: Self-pay | Admitting: Endocrinology

## 2015-12-05 ENCOUNTER — Telehealth: Payer: Self-pay | Admitting: Endocrinology

## 2015-12-05 MED ORDER — HYDROCODONE-ACETAMINOPHEN 10-325 MG PO TABS: 1.0000 | ORAL_TABLET | ORAL | Status: DC | PRN

## 2015-12-05 NOTE — Telephone Encounter (Signed)
i printed 

## 2015-12-05 NOTE — Telephone Encounter (Signed)
PT needs his Hydrocodone Rx refilled

## 2015-12-05 NOTE — Telephone Encounter (Signed)
See note below and please advise, Thanks! 

## 2015-12-05 NOTE — Telephone Encounter (Signed)
I contacted the pt and advised the rx is ready for pick up. Rx placed up front.

## 2015-12-16 ENCOUNTER — Other Ambulatory Visit: Payer: Self-pay | Admitting: Endocrinology

## 2015-12-24 DIAGNOSIS — E119 Type 2 diabetes mellitus without complications: Secondary | ICD-10-CM | POA: Diagnosis not present

## 2015-12-24 DIAGNOSIS — H35033 Hypertensive retinopathy, bilateral: Secondary | ICD-10-CM | POA: Diagnosis not present

## 2015-12-24 DIAGNOSIS — H5203 Hypermetropia, bilateral: Secondary | ICD-10-CM | POA: Diagnosis not present

## 2015-12-31 ENCOUNTER — Encounter: Payer: Self-pay | Admitting: Endocrinology

## 2015-12-31 ENCOUNTER — Ambulatory Visit (INDEPENDENT_AMBULATORY_CARE_PROVIDER_SITE_OTHER): Payer: Medicare Other | Admitting: Endocrinology

## 2015-12-31 VITALS — BP 137/80 | HR 85 | Temp 98.5°F | Ht 68.5 in | Wt 203.0 lb

## 2015-12-31 DIAGNOSIS — Z0000 Encounter for general adult medical examination without abnormal findings: Secondary | ICD-10-CM | POA: Diagnosis not present

## 2015-12-31 DIAGNOSIS — E7800 Pure hypercholesterolemia, unspecified: Secondary | ICD-10-CM

## 2015-12-31 DIAGNOSIS — Z794 Long term (current) use of insulin: Secondary | ICD-10-CM

## 2015-12-31 DIAGNOSIS — E1151 Type 2 diabetes mellitus with diabetic peripheral angiopathy without gangrene: Secondary | ICD-10-CM | POA: Diagnosis not present

## 2015-12-31 DIAGNOSIS — Z0189 Encounter for other specified special examinations: Secondary | ICD-10-CM | POA: Diagnosis not present

## 2015-12-31 LAB — LIPID PANEL
CHOLESTEROL: 108 mg/dL (ref 0–200)
HDL: 30.9 mg/dL — ABNORMAL LOW (ref >39.00)
LDL CALC: 61 mg/dL (ref 0–99)
NonHDL: 76.8
TRIGLYCERIDES: 78 mg/dL (ref 0.0–149.0)
Total CHOL/HDL Ratio: 3
VLDL: 15.6 mg/dL (ref 0.0–40.0)

## 2015-12-31 LAB — BASIC METABOLIC PANEL
BUN: 10 mg/dL (ref 6–23)
CALCIUM: 9.8 mg/dL (ref 8.4–10.5)
CO2: 26 mEq/L (ref 19–32)
Chloride: 105 mEq/L (ref 96–112)
Creatinine, Ser: 0.95 mg/dL (ref 0.40–1.50)
GFR: 86.18 mL/min (ref >60.00)
Glucose, Bld: 161 mg/dL — ABNORMAL HIGH (ref 70–99)
Potassium: 3.9 mEq/L (ref 3.5–5.1)
SODIUM: 136 meq/L (ref 135–145)

## 2015-12-31 LAB — CBC WITH DIFFERENTIAL/PLATELET
Basophils Absolute: 0.1 10*3/uL (ref 0.0–0.1)
Basophils Relative: 0.5 % (ref 0.0–3.0)
Eosinophils Absolute: 0.4 10*3/uL (ref 0.0–0.7)
Eosinophils Relative: 2.5 % (ref 0.0–5.0)
HEMATOCRIT: 45.5 % (ref 39.0–52.0)
Hemoglobin: 15.8 g/dL (ref 13.0–17.0)
LYMPHS ABS: 3.7 10*3/uL (ref 0.7–4.0)
LYMPHS PCT: 24.4 % (ref 12.0–46.0)
MCHC: 34.7 g/dL (ref 30.0–36.0)
MCV: 96.3 fl (ref 78.0–100.0)
MONOS PCT: 5.7 % (ref 3.0–12.0)
Monocytes Absolute: 0.9 10*3/uL (ref 0.1–1.0)
NEUTROS PCT: 66.9 % (ref 43.0–77.0)
Neutro Abs: 10.1 10*3/uL — ABNORMAL HIGH (ref 1.4–7.7)
Platelets: 288 10*3/uL (ref 150.0–400.0)
RBC: 4.72 Mil/uL (ref 4.22–5.81)
RDW: 13.1 % (ref 11.5–15.5)
WBC: 15.2 10*3/uL — ABNORMAL HIGH (ref 4.0–10.5)

## 2015-12-31 LAB — HEPATIC FUNCTION PANEL
ALBUMIN: 4.1 g/dL (ref 3.5–5.2)
ALT: 22 U/L (ref 0–53)
AST: 19 U/L (ref 0–37)
Alkaline Phosphatase: 85 U/L (ref 39–117)
Bilirubin, Direct: 0.1 mg/dL (ref 0.0–0.3)
Total Bilirubin: 0.5 mg/dL (ref 0.2–1.2)
Total Protein: 6.9 g/dL (ref 6.0–8.3)

## 2015-12-31 LAB — TSH: TSH: 1.16 u[IU]/mL (ref 0.35–4.50)

## 2015-12-31 LAB — MICROALBUMIN / CREATININE URINE RATIO
CREATININE, U: 91.3 mg/dL
MICROALB UR: 1.8 mg/dL (ref 0.0–1.9)
MICROALB/CREAT RATIO: 2 mg/g (ref 0.0–30.0)

## 2015-12-31 LAB — POCT GLYCOSYLATED HEMOGLOBIN (HGB A1C): Hemoglobin A1C: 8.5

## 2015-12-31 MED ORDER — INSULIN GLARGINE 100 UNIT/ML SOLOSTAR PEN: 70.0000 [IU] | PEN_INJECTOR | SUBCUTANEOUS | Status: DC

## 2015-12-31 MED ORDER — HYDROCODONE-ACETAMINOPHEN 10-325 MG PO TABS: 1.0000 | ORAL_TABLET | ORAL | Status: DC | PRN

## 2015-12-31 NOTE — Patient Instructions (Addendum)
Please change your current the insulin to "lantus," 70 units each morning.   please consider these measures for your health:  minimize alcohol.  do not use tobacco products.  have a colonoscopy at least every 10 years from age 59.  Women should have an annual mammogram from age 36.  keep firearms safely stored.  always use seat belts.  have working smoke alarms in your home.  see an eye doctor and dentist regularly.  never drive under the influence of alcohol or drugs (including prescription drugs).  those with fair skin should take precautions against the sun. it is critically important to prevent falling down (keep floor areas well-lit, dry, and free of loose objects.  If you have a cane, walker, or wheelchair, you should use it, even for short trips around the house.  Wear flat-soled shoes.  Also, try not to rush) check your blood sugar twice a day.  vary the time of day when you check, between before the 3 meals, and at bedtime.  also check if you have symptoms of your blood sugar being too high or too low.  please keep a record of the readings and bring it to your next appointment here (or you can bring the meter itself).  You can write it on any piece of paper.  please call us sooner if your blood sugar goes below 70, or if you have a lot of readings over 200. Please come back for a follow-up appointment in 3 months.

## 2015-12-31 NOTE — Progress Notes (Signed)
Subjective:    Patient ID: [name], male    DOB: Jan 02, 1956, 60 y.o.   MRN: [id number]  HPI Pt is here for regular wellness examination, and is feeling pretty well in general, and says chronic med probs are stable, except as noted below Past Medical History  Diagnosis Date  . Diabetic neuropathy ([place])   . Smokers' cough ([place])   . Productive cough   . GERD (gastroesophageal reflux disease)   . Hyperlipidemia   . Allergic rhinitis   . History of bladder cancer     s/p  turbt  2013/   transitional cell carcinoma--   . Hypertension   . Lower urinary tract symptoms (LUTS)   . Type 2 diabetes mellitus with insulin therapy ([place])     monitor by  dr ellsion  . History of gout   . Wears dentures     upper  . At risk for sleep apnea     STOP-BANG= 5   SENT TO PCP 03-14-2014  . PVD (peripheral vascular disease) with claudication (HCC)     bilateral SFA disease-- right > left  and left tibial artery disease--  per duplex  . History of condyloma acuminatum     PERINEAL AREA  W/ RECURRENCY  . Condyloma acuminatum of penis   . Arthritis     Past Surgical History  Procedure Laterality Date  . Laser ablation of penile and perianal warts  07-29-2007  Dr. Johney Maine  . Left shoulder surgery  2003  . Cataract extraction w/ intraocular lens implant Right   . Transurethral resection of bladder tumor  05/21/2012    Procedure: TRANSURETHRAL RESECTION OF BLADDER TUMOR (TURBT);  Surgeon: Claybon Jabs, MD;  Location: [facility];  Service: Urology;  Laterality: N/A;     . Inguinal hidradenitis excision  1998, 1999  . Perineal hidradenitis excision  1998, 1999  . Axillary hidradenitis excision  1997  . Laser ablation condolamata N/A 03/20/2014    Procedure: EXAM UNDER ANESTHESIA, REMOVAL/ABLATION OF CONDYLOMATA PENIS,GROINS, ANUS, ANAL CANAL;  Surgeon: [name], MD;  Location: [place];  Service: General;  Laterality: N/A;  groin and anus  . Co2 laser  application N/A 0000000    Procedure: CO2 LASER APPLICATION,PENIS, GROIN, ANUS;  Surgeon: [name], MD;  Location: [facility];  Service: General;  Laterality: N/A;  . Cardiovascular stress test  07-24-2014  dr Kathlyn [place]    Low risk lexiscan nuclear study with apical thinning and small inferolateral wall infarct at mid & basal level , no ischemia/  normal LVF and wall motion , ef 59%  . Laser ablation condolamata N/A 10/24/2014    Procedure: LASER ABLATION CONDOLAMATA;  Surgeon: Michael Boston, MD;  Location: [facility];  Service: General;  Laterality: N/A;  . Mass excision N/A 10/24/2014    Procedure: EXCISION OF PERINEAL MASS/SINUS;  Surgeon: Michael Boston, MD;  Location: [facility];  Service: General;  Laterality: N/A;  . Hemorrhoid surgery  10/24/2014    Procedure: HEMORRHOIDECTOMY;  Surgeon: Michael Boston, MD;  Location: [facility];  Service: General;;  . Incision and drainage abscess Left 10/24/2014    Procedure: INCISION AND DRAINAGE ABSCESS;  Surgeon: Michael Boston, MD;  Location: [facility];  Service: General;  Laterality: Left;  . Condyloma excision/fulguration N/A 05/21/2015    Procedure: CONDYLOMA REMOVAL;  Surgeon: Kathie Rhodes, MD;  Location: [facility];  Service: Urology;  Laterality: N/A;  . Co2 laser application N/A [id number]    Procedure: CO2 LASER APPLICATION;  Surgeon: Kathie Rhodes, MD;  Location: [facility];  Service: Urology;  Laterality: N/A;    Social History   Social History  . Marital Status: Married    Spouse Name: N/A  . Number of Children: N/A  . Years of Education: N/A   Occupational History  . Disabled    Social History Main Topics  . Smoking status: Current Every Day Smoker -- 1.00 packs/day for 41 years    Types: Cigars, Cigarettes  . Smokeless tobacco: Never Used  . Alcohol Use: No  . Drug Use: No  . Sexual Activity: Not on file    Other Topics Concern  . Not on file   Social History Narrative    Current Outpatient Prescriptions on File Prior to Visit  Medication Sig Dispense Refill  . atorvastatin (LIPITOR) 20 MG tablet Take 1 tablet (20 mg total) by mouth daily. 90 tablet 1  . cilostazol (PLETAL) 100 MG tablet TAKE ONE TABLET BY MOUTH TWICE DAILY 60 tablet 3  . losartan-hydrochlorothiazide (HYZAAR) 100-12.5 MG tablet TAKE ONE TABLET BY MOUTH ONCE DAILY 30 tablet 0   No current facility-administered medications on file prior to visit.    No Known Allergies  Family History  Problem Relation Age of Onset  . Colon cancer Neg Hx   . Esophageal cancer Neg Hx   . Pancreatic cancer Neg Hx   . Prostate cancer Neg Hx   . Kidney disease Neg Hx   . Liver disease Neg Hx   . Lung cancer Neg Hx   . Diabetes Maternal Aunt     x 2  . Hypertension Mother   . Cancer Father     lung ca    BP 137/80 mmHg  Pulse 85  Temp(Src) 98.5 F (36.9 C) (Oral)  Ht 5' 8.5" (1.74 m)  Wt 203 lb (92.08 kg)  BMI 30.41 kg/m2  SpO2 96%   Review of Systems  Constitutional: Negative for fever.  HENT: Negative for hearing loss.   Eyes: Negative for visual disturbance.  Respiratory: Negative for shortness of breath.   Cardiovascular: Negative for chest pain.  Gastrointestinal: Negative for anal bleeding.  Endocrine: Negative for cold intolerance.  Genitourinary: Negative for hematuria and difficulty urinating.  Musculoskeletal: Negative for back pain.  Skin: Negative for rash.  Allergic/Immunologic: Negative for environmental allergies.  Neurological: Positive for numbness. Negative for syncope.  Hematological: Does not bruise/bleed easily.  Psychiatric/Behavioral: Negative for dysphoric mood.       Objective:   Physical Exam VS: see vs page GEN: no distress HEAD: head: no deformity eyes: no periorbital swelling, no proptosis external nose and ears are normal mouth: no lesion seen NECK: supple, thyroid is not  enlarged CHEST WALL: no deformity LUNGS: clear to auscultation BREASTS:  No gynecomastia CV: reg rate and rhythm, no murmur ABD: abdomen is soft, nontender.  no hepatosplenomegaly.  not distended.  no hernia GENITALIA/RECTAL/PROSTATE: sees urology MUSCULOSKELETAL: muscle bulk and strength are grossly normal.  no obvious joint swelling.  gait is normal and steady EXTEMITIES: no deformity.  no ulcer on the feet.  feet are of normal color and temp.  no edema PULSES: dorsalis pedis intact bilat.  no carotid bruit NEURO:  cn 2-12 grossly intact.   readily moves all 4's.  sensation is intact to touch on the feet SKIN:  Normal texture and temperature.  No rash or suspicious lesion is  visible.   NODES:  None palpable at the neck PSYCH: alert, well-oriented.  Does not appear anxious nor depressed.        Assessment & Plan:  Wellness visit today, with problems stable, except as noted.   SEPARATE EVALUATION FOLLOWS--EACH PROBLEM HERE IS NEW, NOT RESPONDING TO TREATMENT, OR POSES SIGNIFICANT RISK TO THE PATIENT'S HEALTH: HISTORY OF THE PRESENT ILLNESS:  Pt returns for f/u of diabetes mellitus: DM type: Insulin-requiring type 2 Dx'ed: 0000000 Complications: PAD and polyneuropathy.  Therapy: insulin since 2009 DKA: never Severe hypoglycemia: never Pancreatitis: never Other: he has been on a simple BID insulin regimen, after poor results with multiple daily injections; he has requested inexpensive human insulin, as he does not have a medicare part-D plan.  Interval history: On 50 am and 40 pm, he had frequent mild hypoglycemia in the middle of the night. He now takes 40 am and 20 pm. Meter is downloaded today, and the printout is scanned into the record.  It varies from 70-300.  Most are checked in am, but There is no trend throughout the day.  He says he can now afford insulin analogs. PAST MEDICAL HISTORY reviewed and up to date today REVIEW OF SYSTEMS: Denies LOC PHYSICAL EXAMINATION: VITAL  SIGNS: See vs page GENERAL: no distress Pulses: dorsalis pedis intact bilat.  MSK: no deformity of the feet. CV: trace bilat leg edema, and bilat varicosities.  Skin: no ulcer on the feet. normal color and temp on the feet.  Neuro: sensation is intact to touch on the feet, but decreased from normal.  Ext: There is bilateral onychomycosis of the toenails.  LAB/XRAY RESULTS: Lab Results  Component Value Date   HGBA1C 8.5 12/31/2015  IMPRESSION: DM: he needs further simplification of his insulin PLAN:  Please change your current the insulin to "lantus," 70 units each morning.  Please come back for a follow-up appointment in 3 months.

## 2015-12-31 NOTE — Progress Notes (Signed)
we discussed code status.  pt requests full code, but would not want to be started or maintained on artificial life-support measures if there was not a reasonable chance of recovery 

## 2016-01-01 LAB — HEPATITIS C ANTIBODY: HCV AB: NEGATIVE

## 2016-01-14 ENCOUNTER — Other Ambulatory Visit: Payer: Self-pay | Admitting: Endocrinology

## 2016-01-14 DIAGNOSIS — Z720 Tobacco use: Secondary | ICD-10-CM | POA: Diagnosis not present

## 2016-01-14 DIAGNOSIS — L821 Other seborrheic keratosis: Secondary | ICD-10-CM | POA: Diagnosis not present

## 2016-01-14 DIAGNOSIS — L732 Hidradenitis suppurativa: Secondary | ICD-10-CM | POA: Diagnosis not present

## 2016-01-14 DIAGNOSIS — A630 Anogenital (venereal) warts: Secondary | ICD-10-CM | POA: Diagnosis not present

## 2016-01-15 ENCOUNTER — Encounter: Payer: Self-pay | Admitting: Cardiovascular Disease

## 2016-01-15 ENCOUNTER — Ambulatory Visit (INDEPENDENT_AMBULATORY_CARE_PROVIDER_SITE_OTHER): Payer: Medicare Other | Admitting: Cardiovascular Disease

## 2016-01-15 VITALS — BP 151/84 | HR 78 | Ht 68.0 in | Wt 202.0 lb

## 2016-01-15 DIAGNOSIS — I739 Peripheral vascular disease, unspecified: Secondary | ICD-10-CM | POA: Diagnosis not present

## 2016-01-15 DIAGNOSIS — I10 Essential (primary) hypertension: Secondary | ICD-10-CM

## 2016-01-15 MED ORDER — CILOSTAZOL 100 MG PO TABS: 100.0000 mg | ORAL_TABLET | Freq: Two times a day (BID) | ORAL | Status: DC

## 2016-01-15 NOTE — Patient Instructions (Signed)
Your physician wants you to follow-up in: 1 Year. You will receive a reminder letter in the mail two months in advance. If you don't receive a letter, please call our office to schedule the follow-up appointment.  

## 2016-01-15 NOTE — Progress Notes (Signed)
Cardiology Office Note   Date:  01/15/2016   ID:  Paul, [name] 11/19/1955, MRN [id number]  PCP:  [name], MD  Cardiologist:   Kathlyn Sacramento, MD   Chief Complaint  Patient presents with  . Annual Exam    patient treports no new problems since his last visit.      History of Present Illness: [name] is a 60 y.o. male who presents for a follow up visit regarding PAD. He has known history of insulin-requiring DM (dx'ed 1992),  Hyperlipidemia, hypertension and tobacco use. He is followed for right calf claudication due to occluded right SFA. Noninvasive evaluation in August 2015 showed ABI: normal on the left and 0.44 on right.  Duplex showed long occlusion of right SFA from mid to distal segment with reconstitution in popliteal artery. There was significant left SFA stenosis.  He had improvement in symptoms on cilostazol. Claudication seems to be stable since last visit.  He had atypical chest pain in late 2015. Nuclear stress test showed a small inferolateral infarct with no significant ischemia. He was treated medically given that his chest pain resolved. He continues to smoke.  He reports no further episodes of chest pain. Exertional dyspnea is stable.  Past Medical History  Diagnosis Date  . Diabetic neuropathy ([place])   . Smokers' cough ([place])   . Productive cough   . GERD (gastroesophageal reflux disease)   . Hyperlipidemia   . Allergic rhinitis   . History of bladder cancer     s/p  turbt  2013/   transitional cell carcinoma--   . Hypertension   . Lower urinary tract symptoms (LUTS)   . Type 2 diabetes mellitus with insulin therapy ([place])     monitor by  dr ellsion  . History of gout   . Wears dentures     upper  . At risk for sleep apnea     STOP-BANG= 5   SENT TO PCP 03-14-2014  . PVD (peripheral vascular disease) with claudication (HCC)     bilateral SFA disease-- right > left  and left tibial artery disease--  per duplex  . History of condyloma  acuminatum     PERINEAL AREA  W/ RECURRENCY  . Condyloma acuminatum of penis   . Arthritis     Past Surgical History  Procedure Laterality Date  . Laser ablation of penile and perianal warts  07-29-2007  Dr. Johney Maine  . Left shoulder surgery  2003  . Cataract extraction w/ intraocular lens implant Right   . Transurethral resection of bladder tumor  05/21/2012    Procedure: TRANSURETHRAL RESECTION OF BLADDER TUMOR (TURBT);  Surgeon: Claybon Jabs, MD;  Location: [facility];  Service: Urology;  Laterality: N/A;     . Inguinal hidradenitis excision  1998, 1999  . Perineal hidradenitis excision  1998, 1999  . Axillary hidradenitis excision  1997  . Laser ablation condolamata N/A 03/20/2014    Procedure: EXAM UNDER ANESTHESIA, REMOVAL/ABLATION OF CONDYLOMATA PENIS,GROINS, ANUS, ANAL CANAL;  Surgeon: [name], MD;  Location: [place];  Service: General;  Laterality: N/A;  groin and anus  . Co2 laser application N/A 0000000    Procedure: CO2 LASER APPLICATION,PENIS, GROIN, ANUS;  Surgeon: [name], MD;  Location: [facility];  Service: General;  Laterality: N/A;  . Cardiovascular stress test  07-24-2014  dr Kathlyn Sacramento    Low risk lexiscan nuclear study with apical thinning and small  inferolateral wall infarct at mid & basal level , no ischemia/  normal LVF and wall motion , ef 59%  . Laser ablation condolamata N/A 10/24/2014    Procedure: LASER ABLATION CONDOLAMATA;  Surgeon: Michael Boston, MD;  Location: [facility];  Service: General;  Laterality: N/A;  . Mass excision N/A 10/24/2014    Procedure: EXCISION OF PERINEAL MASS/SINUS;  Surgeon: Michael Boston, MD;  Location: [facility];  Service: General;  Laterality: N/A;  . Hemorrhoid surgery  10/24/2014    Procedure: HEMORRHOIDECTOMY;  Surgeon: Michael Boston, MD;  Location: [facility];  Service: General;;  . Incision and drainage abscess  Left 10/24/2014    Procedure: INCISION AND DRAINAGE ABSCESS;  Surgeon: Michael Boston, MD;  Location: [facility];  Service: General;  Laterality: Left;  . Condyloma excision/fulguration N/A 05/21/2015    Procedure: CONDYLOMA REMOVAL;  Surgeon: Kathie Rhodes, MD;  Location: [facility];  Service: Urology;  Laterality: N/A;  . Co2 laser application N/A [id number]    Procedure: CO2 LASER APPLICATION;  Surgeon: Kathie Rhodes, MD;  Location: [facility];  Service: Urology;  Laterality: N/A;     Current Outpatient Prescriptions  Medication Sig Dispense Refill  . atorvastatin (LIPITOR) 20 MG tablet Take 1 tablet (20 mg total) by mouth daily. 90 tablet 1  . cilostazol (PLETAL) 100 MG tablet TAKE ONE TABLET BY MOUTH TWICE DAILY 60 tablet 3  . HYDROcodone-acetaminophen (NORCO) 10-325 MG tablet Take 1-2 tablets by mouth every 4 (four) hours as needed for moderate pain or severe pain. 100 tablet 0  . Insulin Glargine (LANTUS SOLOSTAR) 100 UNIT/ML Solostar Pen Inject 70 Units into the skin every morning. And pen needles 1/day 10 pen PRN  . losartan-hydrochlorothiazide (HYZAAR) 100-12.5 MG tablet TAKE ONE TABLET BY MOUTH ONCE DAILY 30 tablet 0   No current facility-administered medications for this visit.    Allergies:   Review of patient's allergies indicates no known allergies.    Social History:  The patient  reports that he has been smoking Cigars and Cigarettes.  He has a 41 pack-year smoking history. He has never used smokeless tobacco. He reports that he does not drink alcohol or use illicit drugs.   Family History:  The patient's family history includes Cancer in his father; Diabetes in his maternal aunt; Hypertension in his mother. There is no history of Colon cancer, Esophageal cancer, Pancreatic cancer, Prostate cancer, Kidney disease, Liver disease, or Lung cancer.    ROS:  Please see the history of present illness.   Otherwise, review of systems are  positive for none.   All other systems are reviewed and negative.    PHYSICAL EXAM: VS:  BP 151/84 mmHg  Pulse 78  Ht 5\' 8"  (1.727 m)  Wt 202 lb (91.627 kg)  BMI 30.72 kg/m2 , BMI Body mass index is 30.72 kg/(m^2). GEN: Well nourished, well developed, in no acute distress HEENT: normal Neck: no JVD, carotid bruits, or masses Cardiac: RRR; no murmurs, rubs, or gallops,no edema  Respiratory:  clear to auscultation bilaterally, normal work of breathing GI: soft, nontender, nondistended, + BS MS: no deformity or atrophy Skin: warm and dry, no rash Neuro:  Strength and sensation are intact Psych: euthymic mood, full affect   EKG:  EKG is not ordered today.    Recent Labs: 12/31/2015: ALT 22; BUN 10; Creatinine, Ser 0.95; Hemoglobin 15.8; Platelets 288.0; Potassium 3.9; Sodium 136; TSH 1.16    Lipid Panel  Component Value Date/Time   CHOL 108 12/31/2015 1334   TRIG 78.0 12/31/2015 1334   HDL 30.90* 12/31/2015 1334   CHOLHDL 3 12/31/2015 1334   VLDL 15.6 12/31/2015 1334   LDLCALC 61 12/31/2015 1334      Wt Readings from Last 3 Encounters:  01/15/16 202 lb (91.627 kg)  12/31/15 203 lb (92.08 kg)  10/01/15 205 lb (92.987 kg)       ASSESSMENT AND PLAN:  1.  Peripheral arterial disease: Known occluded right SFA with claudication. No evidence of critical limb ischemia. His symptoms improved significantly with cilostazol and currently he does not feel limited. Thus, I recommend continuing medical therapy.  2. Coronary artery disease involving native coronary arteries without angina: Previous stress test in 2015 showed evidence of a small prior inferolateral infarct. Currently with no anginal symptoms. Continue medical therapy.  3. Essential hypertension: Blood pressure is mildly elevated. Consider increasing the dose of hydrochlorothiazide.  4. Hyperlipidemia: Continue treatment with atorvastatin with a target LDL of less than 70. Most recent LDL was 61.  5. Tobacco  use: I again discussed with him the importance of smoking cessation but currently he has no desire to quit.  6. Diabetes mellitus: Most recent hemoglobin A1c was 8.5. This is being managed by Dr. Loanne Drilling.    Disposition:   FU with me in 1 year  Signed,  Kathlyn Sacramento, MD  01/15/2016 10:46 AM    [place]

## 2016-02-04 ENCOUNTER — Telehealth: Payer: Self-pay | Admitting: Endocrinology

## 2016-02-04 MED ORDER — HYDROCODONE-ACETAMINOPHEN 10-325 MG PO TABS: 1.0000 | ORAL_TABLET | ORAL | Status: DC | PRN

## 2016-02-04 NOTE — Telephone Encounter (Signed)
See below. Can you refill for Dr. Loanne Drilling while he is out of the office? Thanks!

## 2016-02-04 NOTE — Telephone Encounter (Signed)
Rx signed. Pt advised rx is ready for pick up. Rx placed up front.

## 2016-02-04 NOTE — Telephone Encounter (Signed)
Ok

## 2016-02-04 NOTE — Telephone Encounter (Signed)
PT called needs his hydrocodone refilled

## 2016-02-17 ENCOUNTER — Other Ambulatory Visit: Payer: Self-pay | Admitting: Endocrinology

## 2016-03-03 ENCOUNTER — Telehealth: Payer: Self-pay | Admitting: Endocrinology

## 2016-03-03 MED ORDER — HYDROCODONE-ACETAMINOPHEN 10-325 MG PO TABS: 1.0000 | ORAL_TABLET | ORAL | 0 refills | Status: DC | PRN

## 2016-03-03 NOTE — Telephone Encounter (Signed)
I printed  

## 2016-03-03 NOTE — Telephone Encounter (Signed)
Patient would like his Hydrocodone medication refilled. °

## 2016-03-10 NOTE — Telephone Encounter (Signed)
Pt picked up the Hydrocodone Rx on 03/04/2016 at 12:45 pm.

## 2016-03-15 ENCOUNTER — Other Ambulatory Visit: Payer: Self-pay | Admitting: Endocrinology

## 2016-04-02 ENCOUNTER — Telehealth: Payer: Self-pay | Admitting: Endocrinology

## 2016-04-02 MED ORDER — HYDROCODONE-ACETAMINOPHEN 10-325 MG PO TABS: 1.0000 | ORAL_TABLET | ORAL | 0 refills | Status: DC | PRN

## 2016-04-02 NOTE — Addendum Note (Signed)
Addended by: [name] on: 04/02/2016 04:11 PM   Modules accepted: Orders

## 2016-04-02 NOTE — Telephone Encounter (Signed)
Please advise, Thanks!  

## 2016-04-02 NOTE — Telephone Encounter (Signed)
I printed  

## 2016-04-02 NOTE — Telephone Encounter (Signed)
I contacted the pt and left a vm and advised of Rx is ready for pick up. Rx placed upfront.

## 2016-04-02 NOTE — Telephone Encounter (Signed)
Pt needs hydrocodone refill please

## 2016-04-08 ENCOUNTER — Ambulatory Visit: Payer: Medicare Other | Admitting: Endocrinology

## 2016-04-16 ENCOUNTER — Other Ambulatory Visit: Payer: Self-pay | Admitting: Endocrinology

## 2016-04-18 ENCOUNTER — Other Ambulatory Visit: Payer: Self-pay | Admitting: Endocrinology

## 2016-04-21 ENCOUNTER — Ambulatory Visit: Payer: Medicare Other | Admitting: Endocrinology

## 2016-04-24 DIAGNOSIS — Z8551 Personal history of malignant neoplasm of bladder: Secondary | ICD-10-CM | POA: Diagnosis not present

## 2016-05-04 ENCOUNTER — Other Ambulatory Visit: Payer: Self-pay | Admitting: Endocrinology

## 2016-05-05 ENCOUNTER — Other Ambulatory Visit: Payer: Self-pay

## 2016-05-05 ENCOUNTER — Ambulatory Visit (INDEPENDENT_AMBULATORY_CARE_PROVIDER_SITE_OTHER): Payer: Medicare Other | Admitting: Endocrinology

## 2016-05-05 ENCOUNTER — Encounter: Payer: Self-pay | Admitting: Endocrinology

## 2016-05-05 VITALS — BP 132/70 | HR 78 | Ht 68.0 in | Wt 203.0 lb

## 2016-05-05 DIAGNOSIS — E1151 Type 2 diabetes mellitus with diabetic peripheral angiopathy without gangrene: Secondary | ICD-10-CM | POA: Diagnosis not present

## 2016-05-05 DIAGNOSIS — Z23 Encounter for immunization: Secondary | ICD-10-CM

## 2016-05-05 DIAGNOSIS — M25512 Pain in left shoulder: Secondary | ICD-10-CM

## 2016-05-05 DIAGNOSIS — Z794 Long term (current) use of insulin: Secondary | ICD-10-CM | POA: Diagnosis not present

## 2016-05-05 DIAGNOSIS — F17200 Nicotine dependence, unspecified, uncomplicated: Secondary | ICD-10-CM

## 2016-05-05 LAB — POCT GLYCOSYLATED HEMOGLOBIN (HGB A1C): Hemoglobin A1C: 8.2

## 2016-05-05 MED ORDER — HYDROCODONE-ACETAMINOPHEN 5-325 MG PO TABS: 1.0000 | ORAL_TABLET | Freq: Four times a day (QID) | ORAL | 0 refills | Status: DC | PRN

## 2016-05-05 MED ORDER — INSULIN GLARGINE 100 UNIT/ML SOLOSTAR PEN: 75.0000 [IU] | PEN_INJECTOR | SUBCUTANEOUS | 99 refills | Status: DC

## 2016-05-05 MED ORDER — GABAPENTIN 300 MG PO CAPS: 300.0000 mg | ORAL_CAPSULE | Freq: Two times a day (BID) | ORAL | 5 refills | Status: DC

## 2016-05-05 MED ORDER — LOSARTAN POTASSIUM-HCTZ 100-12.5 MG PO TABS: 1.0000 | ORAL_TABLET | Freq: Every day | ORAL | 0 refills | Status: DC

## 2016-05-05 MED ORDER — LOSARTAN POTASSIUM-HCTZ 100-12.5 MG PO TABS: 1.0000 | ORAL_TABLET | Freq: Every day | ORAL | 1 refills | Status: DC

## 2016-05-05 NOTE — Progress Notes (Signed)
Subjective:    Patient ID: [name], male    DOB: March 17, 1956, 60 y.o.   MRN: [id number]  HPI Pt returns for f/u of diabetes mellitus: DM type: Insulin-requiring type 2 Dx'ed: 0000000 Complications: PAD and polyneuropathy.  Therapy: insulin since 2009 DKA: never Severe hypoglycemia: never Pancreatitis: never Other: he has been on a QD insulin regimen, after poor results with multiple daily injections, and then with BID insulin;   Interval history: On 50 am and 40 pm, he had frequent mild hypoglycemia in the middle of the night. He now takes 40 am and 20 pm. Meter is downloaded today, and the printout is scanned into the record.  It varies from 70-300.  It is in general higher as the day goes on Past Medical History:  Diagnosis Date  . Allergic rhinitis   . Arthritis   . At risk for sleep apnea    STOP-BANG= 5   SENT TO PCP 03-14-2014  . Condyloma acuminatum of penis   . Diabetic neuropathy ([place])   . GERD (gastroesophageal reflux disease)   . History of bladder cancer    s/p  turbt  2013/   transitional cell carcinoma--   . History of condyloma acuminatum    PERINEAL AREA  W/ RECURRENCY  . History of gout   . Hyperlipidemia   . Hypertension   . Lower urinary tract symptoms (LUTS)   . Productive cough   . PVD (peripheral vascular disease) with claudication (HCC)    bilateral SFA disease-- right > left  and left tibial artery disease--  per duplex  . Smokers' cough ([place])   . Type 2 diabetes mellitus with insulin therapy ([place])    monitor by  dr ellsion  . Wears dentures    upper    Past Surgical History:  Procedure Laterality Date  . AXILLARY HIDRADENITIS EXCISION  1997  . CARDIOVASCULAR STRESS TEST  07-24-2014  dr Kathlyn Sacramento   Low risk lexiscan nuclear study with apical thinning and small inferolateral wall infarct at mid & basal level , no ischemia/  normal LVF and wall motion , ef 59%  . CATARACT EXTRACTION W/ INTRAOCULAR LENS IMPLANT Right   . CO2 LASER  APPLICATION N/A 0000000   Procedure: CO2 LASER APPLICATION,PENIS, GROIN, ANUS;  Surgeon: [name], MD;  Location: [place];  Service: General;  Laterality: N/A;  . CO2 LASER APPLICATION N/A 123XX123   Procedure: CO2 LASER APPLICATION;  Surgeon: Kathie Rhodes, MD;  Location: [facility];  Service: Urology;  Laterality: N/A;  . CONDYLOMA EXCISION/FULGURATION N/A 05/21/2015   Procedure: CONDYLOMA REMOVAL;  Surgeon: Kathie Rhodes, MD;  Location: [facility];  Service: Urology;  Laterality: N/A;  . HEMORRHOID SURGERY  10/24/2014   Procedure: HEMORRHOIDECTOMY;  Surgeon: Michael Boston, MD;  Location: [facility];  Service: General;;  . INCISION AND DRAINAGE ABSCESS Left 10/24/2014   Procedure: INCISION AND DRAINAGE ABSCESS;  Surgeon: Michael Boston, MD;  Location: [place];  Service: General;  Laterality: Left;  . INGUINAL HIDRADENITIS EXCISION  1998, 1999  . LASER ABLATION CONDOLAMATA N/A 03/20/2014   Procedure: EXAM UNDER ANESTHESIA, REMOVAL/ABLATION OF CONDYLOMATA PENIS,GROINS, ANUS, ANAL CANAL;  Surgeon: [name], MD;  Location: [place];  Service: General;  Laterality: N/A;  groin and anus  . LASER ABLATION CONDOLAMATA N/A 10/24/2014   Procedure: LASER ABLATION CONDOLAMATA;  Surgeon: Michael Boston, MD;  Location: [facility];  Service: General;  Laterality: N/A;  . LASER ABLATION OF PENILE AND PERIANAL WARTS  07-29-2007  Dr. Johney Maine  . LEFT SHOULDER SURGERY  2003  . MASS EXCISION N/A 10/24/2014   Procedure: EXCISION OF PERINEAL MASS/SINUS;  Surgeon: Michael Boston, MD;  Location: [place];  Service: General;  Laterality: N/A;  . [place]  . TRANSURETHRAL RESECTION OF BLADDER TUMOR  05/21/2012   Procedure: TRANSURETHRAL RESECTION OF BLADDER TUMOR (TURBT);  Surgeon: Claybon Jabs, MD;  Location: [facility];  Service:  Urology;  Laterality: N/A;       Social History   Social History  . Marital status: Married    Spouse name: N/A  . Number of children: N/A  . Years of education: N/A   Occupational History  . Disabled    Social History Main Topics  . Smoking status: Current Every Day Smoker    Packs/day: 1.00    Years: 41.00    Types: Cigars, Cigarettes  . Smokeless tobacco: Never Used  . Alcohol use No  . Drug use: No  . Sexual activity: Not on file   Other Topics Concern  . Not on file   Social History Narrative  . No narrative on file    Current Outpatient Prescriptions on File Prior to Visit  Medication Sig Dispense Refill  . atorvastatin (LIPITOR) 20 MG tablet TAKE ONE TABLET BY MOUTH ONCE DAILY 90 tablet 1  . cilostazol (PLETAL) 100 MG tablet Take 1 tablet (100 mg total) by mouth 2 (two) times daily. 60 tablet 11   No current facility-administered medications on file prior to visit.     No Known Allergies  Family History  Problem Relation Age of Onset  . Colon cancer Neg Hx   . Esophageal cancer Neg Hx   . Pancreatic cancer Neg Hx   . Prostate cancer Neg Hx   . Kidney disease Neg Hx   . Liver disease Neg Hx   . Lung cancer Neg Hx   . Diabetes Maternal Aunt     x 2  . Hypertension Mother   . Cancer Father     lung ca    BP 132/70   Pulse 78   Ht 5\' 8"  (1.727 m)   Wt 203 lb (92.1 kg)   SpO2 98%   BMI 30.87 kg/m    Review of Systems Chronic low-back and left shoulder pain persist.  He has mid insomnia. He denies hypoglycemia.      Objective:   Physical Exam VITAL SIGNS: See vs page GENERAL: no distress Pulses: dorsalis pedis intact bilat.  MSK: no deformity of the feet.  CV: trace bilat leg edema, and bilat varicosities.  Skin: no ulcer on the feet. normal color and temp on the feet.  Neuro: sensation is intact to touch on the feet, but decreased from normal.  Ext: There is bilateral onychomycosis of the toenails.   A1c=8.2%    Assessment &  Plan:  Insulin-requiring type 2 DM: We discussed options: he declines to add humalog with supper, or to change lantus to levemir.   Chronic pain syndrome, persistent. Insomnia: gabapentin will help.      Subjective:   Patient here for Medicare annual wellness visit and management of other chronic and acute problems.     Risk factors: advanced age    30 of Physicians Providing Medical Care to Patient:  See "snapshot"   Activities of Daily Living: In your present state of health, do  you have any difficulty performing the following activities?:  Preparing food and eating?: No  Bathing yourself: No  Getting dressed: No  Using the toilet:No  Moving around from place to place: No  In the past year have you fallen or had a near fall?: No    Home Safety: Has smoke detector and wears seat belts. Firearms are safely stored. No excess sun exposure.   Diet and Exercise  Current exercise habits: pt says good Dietary issues discussed: pt reports a healthy diet   Depression Screen  Q1: Over the past two weeks, have you felt down, depressed or hopeless? no  Q2: Over the past two weeks, have you felt little interest or pleasure in doing things? no   The following portions of the patient's history were reviewed and updated as appropriate: allergies, current medications, past family history, past medical history, past social history, past surgical history and problem list.   Review of Systems  Denies hearing loss, and visual loss Objective:   Vision:  Advertising account executive, so he declines VA today Hearing: grossly normal Body mass index:  See vs page.  Msk: pt easily and quickly performs "get-up-and-go" from a sitting position.  Cognitive Impairment Assessment: cognition, memory and judgment appear normal.  remembers 2/3 at 5 minutes (? effort).  excellent recall.  can easily read and write a sentence.  alert and oriented x 3   Assessment:   Medicare wellness utd on preventive  parameters    Plan:   During the course of the visit the patient was educated and counseled about appropriate screening and preventive services including:        Fall prevention   Diabetes screening  Nutrition counseling   Vaccines / LABS Pneumococcal Vaccine today  PSA is up to date.  Patient Instructions (the written plan) was given to the patient.

## 2016-05-05 NOTE — Patient Instructions (Addendum)
Please increase the lantus to 75 units each morning. I have sent a prescription to your pharmacy, to start gabapentin (this helps the hydrocodone work better), and to change the hydrocodone to 5 mg at a time.  Please call if the pain is worse, so we can increase the gabapentin.   Try to move your meals around, so that you eat more in the early hours of the day, and less later.   please consider these measures for your health:  minimize alcohol.  do not use tobacco products.  have a colonoscopy at least every 10 years from age 45.  Women should have an annual mammogram from age 56.  keep firearms safely stored.  always use seat belts.  have working smoke alarms in your home.  see an eye doctor and dentist regularly.  never drive under the influence of alcohol or drugs (including prescription drugs).  those with fair skin should take precautions against the sun.   it is critically important to prevent falling down (keep floor areas well-lit, dry, and free of loose objects.  If you have a cane, walker, or wheelchair, you should use it, even for short trips around the house.  Wear flat-soled shoes.  Also, try not to rush).   check your blood sugar twice a day.  vary the time of day when you check, between before the 3 meals, and at bedtime.  also check if you have symptoms of your blood sugar being too high or too low.  please keep a record of the readings and bring it to your next appointment here (or you can bring the meter itself).  You can write it on any piece of paper.  please call us sooner if your blood sugar goes below 70, or if you have a lot of readings over 200.   Please come back for a follow-up appointment in 2 months.

## 2016-05-06 DIAGNOSIS — H029 Unspecified disorder of eyelid: Secondary | ICD-10-CM | POA: Diagnosis not present

## 2016-05-06 DIAGNOSIS — H04522 Eversion of left lacrimal punctum: Secondary | ICD-10-CM | POA: Diagnosis not present

## 2016-05-06 DIAGNOSIS — H119 Unspecified disorder of conjunctiva: Secondary | ICD-10-CM | POA: Diagnosis not present

## 2016-05-13 DIAGNOSIS — H2513 Age-related nuclear cataract, bilateral: Secondary | ICD-10-CM | POA: Diagnosis not present

## 2016-05-13 DIAGNOSIS — H538 Other visual disturbances: Secondary | ICD-10-CM | POA: Diagnosis not present

## 2016-05-13 DIAGNOSIS — H2512 Age-related nuclear cataract, left eye: Secondary | ICD-10-CM | POA: Diagnosis not present

## 2016-05-13 DIAGNOSIS — H02831 Dermatochalasis of right upper eyelid: Secondary | ICD-10-CM | POA: Diagnosis not present

## 2016-05-13 DIAGNOSIS — R197 Diarrhea, unspecified: Secondary | ICD-10-CM | POA: Diagnosis not present

## 2016-05-13 DIAGNOSIS — Z794 Long term (current) use of insulin: Secondary | ICD-10-CM | POA: Diagnosis not present

## 2016-05-13 DIAGNOSIS — H119 Unspecified disorder of conjunctiva: Secondary | ICD-10-CM | POA: Diagnosis not present

## 2016-05-13 DIAGNOSIS — H029 Unspecified disorder of eyelid: Secondary | ICD-10-CM | POA: Diagnosis not present

## 2016-05-13 DIAGNOSIS — M542 Cervicalgia: Secondary | ICD-10-CM | POA: Diagnosis not present

## 2016-05-13 DIAGNOSIS — H02834 Dermatochalasis of left upper eyelid: Secondary | ICD-10-CM | POA: Diagnosis not present

## 2016-05-13 DIAGNOSIS — Z961 Presence of intraocular lens: Secondary | ICD-10-CM | POA: Diagnosis not present

## 2016-05-26 DIAGNOSIS — Z961 Presence of intraocular lens: Secondary | ICD-10-CM | POA: Diagnosis not present

## 2016-05-26 DIAGNOSIS — Z9841 Cataract extraction status, right eye: Secondary | ICD-10-CM | POA: Diagnosis not present

## 2016-05-26 DIAGNOSIS — C44119 Basal cell carcinoma of skin of left eyelid, including canthus: Secondary | ICD-10-CM | POA: Diagnosis not present

## 2016-05-26 DIAGNOSIS — H2512 Age-related nuclear cataract, left eye: Secondary | ICD-10-CM | POA: Diagnosis not present

## 2016-05-26 DIAGNOSIS — H02834 Dermatochalasis of left upper eyelid: Secondary | ICD-10-CM | POA: Diagnosis not present

## 2016-05-26 DIAGNOSIS — E119 Type 2 diabetes mellitus without complications: Secondary | ICD-10-CM | POA: Diagnosis not present

## 2016-05-26 DIAGNOSIS — H11442 Conjunctival cysts, left eye: Secondary | ICD-10-CM | POA: Diagnosis not present

## 2016-05-26 DIAGNOSIS — C44319 Basal cell carcinoma of skin of other parts of face: Secondary | ICD-10-CM | POA: Diagnosis not present

## 2016-05-26 DIAGNOSIS — H02831 Dermatochalasis of right upper eyelid: Secondary | ICD-10-CM | POA: Diagnosis not present

## 2016-06-03 ENCOUNTER — Telehealth: Payer: Self-pay | Admitting: Endocrinology

## 2016-06-03 NOTE — Telephone Encounter (Signed)
Patient need a refill of  HYDROcodone-acetaminophen (NORCO/VICODIN) 5-325 MG tablet 100 tablet 0 05/05/2016        [facility] [address], [place] [phone] (Phone) [phone] (Fax)

## 2016-06-05 ENCOUNTER — Emergency Department (HOSPITAL_COMMUNITY): Payer: Medicare Other

## 2016-06-05 ENCOUNTER — Encounter (HOSPITAL_COMMUNITY): Payer: Self-pay

## 2016-06-05 ENCOUNTER — Emergency Department (HOSPITAL_COMMUNITY)
Admission: EM | Admit: 2016-06-05 | Discharge: 2016-06-05 | Disposition: A | Discharge status: Home / Self Care | Payer: Medicare Other | Attending: Emergency Medicine | Admitting: Emergency Medicine

## 2016-06-05 DIAGNOSIS — Z794 Long term (current) use of insulin: Secondary | ICD-10-CM | POA: Diagnosis not present

## 2016-06-05 DIAGNOSIS — Z8551 Personal history of malignant neoplasm of bladder: Secondary | ICD-10-CM | POA: Diagnosis not present

## 2016-06-05 DIAGNOSIS — I10 Essential (primary) hypertension: Secondary | ICD-10-CM | POA: Diagnosis not present

## 2016-06-05 DIAGNOSIS — Y999 Unspecified external cause status: Secondary | ICD-10-CM | POA: Insufficient documentation

## 2016-06-05 DIAGNOSIS — F17210 Nicotine dependence, cigarettes, uncomplicated: Secondary | ICD-10-CM | POA: Diagnosis not present

## 2016-06-05 DIAGNOSIS — E1140 Type 2 diabetes mellitus with diabetic neuropathy, unspecified: Secondary | ICD-10-CM | POA: Diagnosis not present

## 2016-06-05 DIAGNOSIS — S0181XA Laceration without foreign body of other part of head, initial encounter: Secondary | ICD-10-CM | POA: Diagnosis not present

## 2016-06-05 DIAGNOSIS — S0990XA Unspecified injury of head, initial encounter: Secondary | ICD-10-CM | POA: Diagnosis not present

## 2016-06-05 DIAGNOSIS — S81812A Laceration without foreign body, left lower leg, initial encounter: Secondary | ICD-10-CM | POA: Diagnosis not present

## 2016-06-05 DIAGNOSIS — W268XXA Contact with other sharp object(s), not elsewhere classified, initial encounter: Secondary | ICD-10-CM | POA: Diagnosis not present

## 2016-06-05 DIAGNOSIS — S81811A Laceration without foreign body, right lower leg, initial encounter: Secondary | ICD-10-CM | POA: Insufficient documentation

## 2016-06-05 DIAGNOSIS — Z23 Encounter for immunization: Secondary | ICD-10-CM | POA: Diagnosis not present

## 2016-06-05 DIAGNOSIS — Y929 Unspecified place or not applicable: Secondary | ICD-10-CM | POA: Diagnosis not present

## 2016-06-05 DIAGNOSIS — Y939 Activity, unspecified: Secondary | ICD-10-CM | POA: Diagnosis not present

## 2016-06-05 LAB — URINALYSIS, ROUTINE W REFLEX MICROSCOPIC
Bilirubin Urine: NEGATIVE
GLUCOSE, UA: NEGATIVE mg/dL
HGB URINE DIPSTICK: NEGATIVE
Ketones, ur: NEGATIVE mg/dL
LEUKOCYTES UA: NEGATIVE
Nitrite: NEGATIVE
Protein, ur: NEGATIVE mg/dL
SPECIFIC GRAVITY, URINE: 1.006 (ref 1.005–1.030)
pH: 6 (ref 5.0–8.0)

## 2016-06-05 MED ORDER — CEPHALEXIN 500 MG PO CAPS
1000.0000 mg | ORAL_CAPSULE | Freq: Once | ORAL | Status: AC
  Administered 2016-06-05: 1000 mg via ORAL
  Filled 2016-06-05: qty 2

## 2016-06-05 MED ORDER — HYDROCODONE-ACETAMINOPHEN 5-325 MG PO TABS: 1.0000 | ORAL_TABLET | Freq: Four times a day (QID) | ORAL | 0 refills | Status: DC | PRN

## 2016-06-05 MED ORDER — TETANUS-DIPHTH-ACELL PERTUSSIS 5-2.5-18.5 LF-MCG/0.5 IM SUSP
0.5000 mL | Freq: Once | INTRAMUSCULAR | Status: DC
  Filled 2016-06-05: qty 0.5

## 2016-06-05 MED ORDER — CEPHALEXIN 500 MG PO CAPS: 500.0000 mg | ORAL_CAPSULE | Freq: Four times a day (QID) | ORAL | 0 refills | Status: DC

## 2016-06-05 MED ORDER — BUPIVACAINE HCL (PF) 0.25 % IJ SOLN
10.0000 mL | Freq: Once | INTRAMUSCULAR | Status: DC
  Filled 2016-06-05: qty 30

## 2016-06-05 MED ORDER — TETANUS-DIPHTH-ACELL PERTUSSIS 5-2.5-18.5 LF-MCG/0.5 IM SUSP
0.5000 mL | Freq: Once | INTRAMUSCULAR | Status: AC
  Administered 2016-06-05: 0.5 mL via INTRAMUSCULAR

## 2016-06-05 MED ORDER — BUPIVACAINE HCL 0.25 % IJ SOLN
10.0000 mL | Freq: Once | INTRAMUSCULAR | Status: DC
  Administered 2016-06-05: 10 mL
  Filled 2016-06-05: qty 10

## 2016-06-05 NOTE — ED Notes (Signed)
Bed: [id number] Expected date:  Expected time:  Means of arrival:  Comments: EMS-laceration

## 2016-06-05 NOTE — ED Notes (Signed)
No respiratory or acute distress noted alert and oriented x 3 no active bleeding noted call light in reach no reaction to medication noted.

## 2016-06-05 NOTE — Telephone Encounter (Signed)
i'm sorry.  I gave it to reception, and they gave it to patient.

## 2016-06-05 NOTE — ED Provider Notes (Signed)
[facility] DEPT Provider Note   CSN: [id number] Arrival date & time: 06/05/16  1715     History   Chief Complaint Chief Complaint  Patient presents with  . Laceration    HPI [name] is a 60 y.o. male. With a past medical history significant for diabetes and smoking who presents emergency Department with chief complaint of lacerations to bilateral legs. Patient states that he was holding up a large piece of sheet metal when the wind blew. He jumped backward, but suffered a laceration to the right and left shin. He is unsure of his last tetanus vaccination. Patient is on a blood thinner was concerned because he was bleeding heavily. He stopped at the local fire station who bandaged his legs prior to arriving in the emergency department. He denies any numbness or tingling in the feet. Bleeding controlled prior to arrival  HPI  Past Medical History:  Diagnosis Date  . Allergic rhinitis   . Arthritis   . At risk for sleep apnea    STOP-BANG= 5   SENT TO PCP 03-14-2014  . Condyloma acuminatum of penis   . Diabetic neuropathy ([place])   . GERD (gastroesophageal reflux disease)   . History of bladder cancer    s/p  turbt  2013/   transitional cell carcinoma--   . History of condyloma acuminatum    PERINEAL AREA  W/ RECURRENCY  . History of gout   . Hyperlipidemia   . Hypertension   . Lower urinary tract symptoms (LUTS)   . Productive cough   . PVD (peripheral vascular disease) with claudication (HCC)    bilateral SFA disease-- right > left  and left tibial artery disease--  per duplex  . Smokers' cough ([place])   . Type 2 diabetes mellitus with insulin therapy ([place])    monitor by  dr ellsion  . Wears dentures    upper    Patient Active Problem List   Diagnosis Date Noted  . Diabetes ([place]) 10/02/2015  . Back pain 04/02/2015  . Dyslipidemia 01/01/2015  . Wheezing 01/01/2015  . Atypical chest pain 07/18/2014  . Wellness examination 07/03/2014  . PAD (peripheral artery  disease) ([place]) 04/05/2014  . Folliculitis of scrotum 04/03/2014  . Pain in joint, lower leg 04/03/2014  . Edema of posterior shaft of penis 01/31/2014  . Bladder cancer 05/21/2012  . Gross hematuria 03/25/2012  . Encounter for long-term (current) use of other medications 02/02/2012  . Screening for prostate cancer 02/02/2012  . Hepatomegaly 09/10/2010  . ABNORMAL ELECTROCARDIOGRAM 09/10/2010  . FOOT PAIN, LEFT 05/02/2010  . ASTHMA 07/25/2009  . Tobacco abuse 05/22/2009  . Cough 05/11/2008  . Condylomata of penis, groins, perianal - recurrent 01/17/2008  . SHOULDER PAIN, LEFT, CHRONIC 01/17/2008  . HYPERCHOLESTEROLEMIA 10/06/2007  . Essential hypertension 03/06/2007  . ALLERGIC RHINITIS 03/06/2007    Past Surgical History:  Procedure Laterality Date  . AXILLARY HIDRADENITIS EXCISION  1997  . CARDIOVASCULAR STRESS TEST  07-24-2014  dr Kathlyn [place]   Low risk lexiscan nuclear study with apical thinning and small inferolateral wall infarct at mid & basal level , no ischemia/  normal LVF and wall motion , ef 59%  . CATARACT EXTRACTION W/ INTRAOCULAR LENS IMPLANT Right   . CO2 LASER APPLICATION N/A 0000000   Procedure: CO2 LASER APPLICATION,PENIS, GROIN, ANUS;  Surgeon: [name], MD;  Location: [facility];  Service: General;  Laterality: N/A;  . CO2 LASER APPLICATION N/A 123XX123   Procedure: CO2  LASER APPLICATION;  Surgeon: Kathie Rhodes, MD;  Location: [facility];  Service: Urology;  Laterality: N/A;  . CONDYLOMA EXCISION/FULGURATION N/A 05/21/2015   Procedure: CONDYLOMA REMOVAL;  Surgeon: Kathie Rhodes, MD;  Location: [facility];  Service: Urology;  Laterality: N/A;  . HEMORRHOID SURGERY  10/24/2014   Procedure: HEMORRHOIDECTOMY;  Surgeon: Michael Boston, MD;  Location: [facility];  Service: General;;  . INCISION AND DRAINAGE ABSCESS Left 10/24/2014   Procedure: INCISION AND DRAINAGE ABSCESS;  Surgeon: Michael Boston, MD;  Location: [facility];  Service: General;  Laterality: Left;  . INGUINAL HIDRADENITIS EXCISION  1998, 1999  . LASER ABLATION CONDOLAMATA N/A 03/20/2014   Procedure: EXAM UNDER ANESTHESIA, REMOVAL/ABLATION OF CONDYLOMATA PENIS,GROINS, ANUS, ANAL CANAL;  Surgeon: [name], MD;  Location: [facility];  Service: General;  Laterality: N/A;  groin and anus  . LASER ABLATION CONDOLAMATA N/A 10/24/2014   Procedure: LASER ABLATION CONDOLAMATA;  Surgeon: Michael Boston, MD;  Location[facility];  Service: General;  Laterality: N/A;  . LASER ABLATION OF PENILE AND PERIANAL WARTS  07-29-2007  Dr. Johney Maine  . LEFT SHOULDER SURGERY  2003  . MASS EXCISION N/A 10/24/2014   Procedure: EXCISION OF PERINEAL MASS/SINUS;  Surgeon: Michael Boston, MD;  Location: [facility];  Service: General;  Laterality: N/A;  . [place]  . TRANSURETHRAL RESECTION OF BLADDER TUMOR  05/21/2012   Procedure: TRANSURETHRAL RESECTION OF BLADDER TUMOR (TURBT);  Surgeon: Claybon Jabs, MD;  Location: [facility];  Service: Urology;  Laterality: N/A;          Home Medications    Prior to Admission medications   Medication Sig Start Date End Date Taking? Authorizing Provider  atorvastatin (LIPITOR) 20 MG tablet TAKE ONE TABLET BY MOUTH ONCE DAILY 04/18/16  Yes [name], MD  cilostazol (PLETAL) 100 MG tablet Take 1 tablet (100 mg total) by mouth 2 (two) times daily. 01/15/16  Yes Wellington Hampshire, MD  gabapentin (NEURONTIN) 300 MG capsule Take 1 capsule (300 mg total) by mouth 2 (two) times daily. 05/05/16  Yes [name], MD  HYDROcodone-acetaminophen (NORCO/VICODIN) 5-325 MG tablet Take 1 tablet by mouth every 6 (six) hours as needed for moderate pain. 06/05/16  Yes [name], MD  Insulin Glargine (LANTUS SOLOSTAR) 100 UNIT/ML Solostar Pen Inject 75 Units into the skin every morning. And pen needles 1/day  05/05/16  Yes [name], MD  losartan-hydrochlorothiazide (HYZAAR) 100-12.5 MG tablet Take 1 tablet by mouth daily. 05/05/16  Yes [name], MD  cephALEXin (KEFLEX) 500 MG capsule Take 1 capsule (500 mg total) by mouth 4 (four) times daily. 06/05/16   Margarita Mail, PA-C    Family History Family History  Problem Relation Age of Onset  . Hypertension Mother   . Cancer Father     lung ca  . Diabetes Maternal Aunt     x 2  . Colon cancer Neg Hx   . Esophageal cancer Neg Hx   . Pancreatic cancer Neg Hx   . Prostate cancer Neg Hx   . Kidney disease Neg Hx   . Liver disease Neg Hx   . Lung cancer Neg Hx     Social History Social History  Substance Use Topics  . Smoking status: Current Every Day Smoker    Packs/day: 1.00    Years: 41.00    Types: Cigars, Cigarettes  . Smokeless tobacco: Never  Used  . Alcohol use No     Allergies   Review of patient's allergies indicates no known allergies.   Review of Systems Review of Systems  Ten systems reviewed and are negative for acute change, except as noted in the HPI.   Physical Exam Updated Vital Signs BP 154/83 (BP Location: Right Arm)   Pulse 75   Temp 98.3 F (36.8 C) (Oral)   Resp 18   Ht 5\' 8"  (1.727 m)   Wt 93 kg   SpO2 94%   BMI 31.17 kg/m   Physical Exam  Constitutional: He appears well-developed and well-nourished. No distress.  HENT:  Head: Normocephalic and atraumatic.  Eyes: Conjunctivae are normal. No scleral icterus.  Neck: Normal range of motion. Neck supple.  Cardiovascular: Normal rate, regular rhythm and normal heart sounds.   Pulmonary/Chest: Effort normal and breath sounds normal. No respiratory distress.  Abdominal: Soft. There is no tenderness.  Musculoskeletal: He exhibits no edema.  Neurological: He is alert.  Skin: Skin is warm and dry. He is not diaphoretic.  Gaping laceration r Shin  20 cm long with some undermining of the tissue.  Psychiatric: His behavior is normal.  Nursing  note and vitals reviewed.    ED Treatments / Results  Labs (all labs ordered are listed, but only abnormal results are displayed) Labs Reviewed  URINALYSIS, ROUTINE W REFLEX MICROSCOPIC (NOT AT [facility])    EKG  EKG Interpretation None       Radiology Dg Tibia/fibula Left  Result Date: 06/05/2016 CLINICAL DATA:  Bilateral shin lacerations EXAM: LEFT TIBIA AND FIBULA - 2 VIEW COMPARISON:  None. FINDINGS: There is no evidence of fracture or other focal bone lesions. Bandaging overlies the anterior aspect of the mid left leg consistent with history of shin lacerations. No radiopaque foreign bodies or subcutaneous emphysema. No osseous involvement. IMPRESSION: Anterior mid leg lacerations without acute osseous abnormality. Electronically Signed   By: Ashley Royalty M.D.   On: 06/05/2016 19:20   Dg Tibia/fibula Right  Result Date: 06/05/2016 CLINICAL DATA:  Right leg laceration EXAM: RIGHT TIBIA AND FIBULA - 2 VIEW COMPARISON:  None. FINDINGS: Soft tissue lacerations are noted along the medial aspect of the proximal and mid right leg without underlying osseous involvement. No radiopaque foreign body. Knee and ankle spaces appear intact. IMPRESSION: Soft tissue lacerations of the right leg without acute osseous involvement nor radiopaque foreign body. Electronically Signed   By: Ashley Royalty M.D.   On: 06/05/2016 19:23    Procedures .Marland KitchenLaceration Repair Date/Time: 06/05/2016 9:05 PM Performed by: Margarita Mail Authorized by: Margarita Mail   Consent:    Consent obtained:  Verbal   Consent given by:  Patient   Risks discussed:  Infection, pain, nerve damage and poor wound healing Anesthesia (see MAR for exact dosages):    Anesthesia method:  Local infiltration   Local anesthetic:  Bupivacaine 0.25% w/o epi Laceration details:    Location:  Leg   Leg location:  R lower leg   Length (cm):  20   Depth (mm):  0.5 Repair type:    Repair type:  Simple Pre-procedure details:     Preparation:  Patient was prepped and draped in usual sterile fashion Exploration:    Wound exploration: wound explored through full range of motion   Treatment:    Area cleansed with:  Betadine   Amount of cleaning:  Extensive   Irrigation solution:  Sterile saline   Irrigation method:  Pressure wash  Visualized foreign bodies/material removed: no   Skin repair:    Repair method:  Staples   Number of staples:  12 Approximation:    Approximation:  Close   Vermilion border: well-aligned   Post-procedure details:    Dressing:  Sterile dressing   Patient tolerance of procedure:  Tolerated well, no immediate complications    (including critical care time)  Medications Ordered in ED Medications  Tdap (BOOSTRIX) injection 0.5 mL (0.5 mLs Intramuscular Given 06/05/16 2024)  cephALEXin (KEFLEX) capsule 1,000 mg (1,000 mg Oral Given 06/05/16 2125)     Initial Impression / Assessment and Plan / ED Course  I have reviewed the triage vital signs and the nursing notes.  Pertinent labs & imaging results that were available during my care of the patient were reviewed by me and considered in my medical decision making (see chart for details).  Clinical Course    Tdap booster given.Pressure irrigation performed. Laceration occurred < 8 hours prior to repair which was well tolerated Discussed suture home care w pt and answered questions.  D/c with keflex. Pt to f-u for wound check and suture removal in 7 days. Pt is hemodynamically stable w no complaints prior to dc.     Final Clinical Impressions(s) / ED Diagnoses   Final diagnoses:  Leg laceration, right, initial encounter  Leg laceration, left, initial encounter    New Prescriptions Discharge Medication List as of 06/06/2016 12:50 AM    START taking these medications   Details  cephALEXin (KEFLEX) 500 MG capsule Take 1 capsule (500 mg total) by mouth 4 (four) times daily., Starting Thu 06/05/2016, Print         [place], PA-C 06/06/16 0101    Julianne Rice, MD 06/06/16 [phone]

## 2016-06-05 NOTE — Discharge Instructions (Signed)
WOUND CARE Please return in 8 days to have your stitches/staples removed or sooner if you have concerns.  Keep area clean and dry for 24 hours. Do not remove bandage, if applied.  After 24 hours, remove bandage and wash wound gently with mild soap and warm water. Reapply a new bandage after cleaning wound, if directed.  Continue daily cleansing with soap and water until stitches/staples are removed.  Do not apply any ointments or creams to the wound while stitches/staples are in place, as this may cause delayed healing.  Notify the office if you experience any of the following signs of infection: Swelling, redness, pus drainage, streaking, fever >101.0 F  Notify the office if you experience excessive bleeding that does not stop after 15-20 minutes of constant, firm pressure.

## 2016-06-05 NOTE — Telephone Encounter (Signed)
I could not locate where this medication has been printed, could it be printed again?

## 2016-06-05 NOTE — Telephone Encounter (Signed)
Noted  

## 2016-06-05 NOTE — Telephone Encounter (Signed)
See message and please advise, Thanks!  

## 2016-06-05 NOTE — Telephone Encounter (Signed)
I printed  

## 2016-06-05 NOTE — ED Triage Notes (Addendum)
PT RECEIVED VIA EMS FOR BILATERAL SHIN LACERATIONS. PT STS HE WAS STACKING METAL COVERINGS WHEN THEY BEGAN TO FALL, SLICING BOTH SHINS. PER EMS HIS INITIAL O2 SAT WAS 92%. 2L VIA [place] GIVEN 97%. PT STS HE TAKES COUMADIN.

## 2016-06-09 ENCOUNTER — Telehealth: Payer: Self-pay | Admitting: Cardiovascular Disease

## 2016-06-09 ENCOUNTER — Telehealth (HOSPITAL_BASED_OUTPATIENT_CLINIC_OR_DEPARTMENT_OTHER): Payer: Self-pay | Admitting: Emergency Medicine

## 2016-06-09 DIAGNOSIS — E109 Type 1 diabetes mellitus without complications: Secondary | ICD-10-CM | POA: Diagnosis not present

## 2016-06-09 NOTE — Telephone Encounter (Signed)
Pt reports on 10/26,  metal cut both shins. Left shin laceration, 13" long, 1.5" deep and did not require staples/stitches.  Right leg laceration 2.5" deep, 12 " long requiring 15 staples. He was d/c'd from ED w/Keflex and instructed to have stitches removed in 8 days. Pt called today to report right thigh throbbing. He had difficulty sleeping last evening, placed a  cushion between his legs and slept better. Reports improvement in pain with no signs of bleeding.  He takes pletal 100mg  twice daily and is concerned if the laceration could affect known PAD.  He reports legs warm, with no discoloration bilaterally. Advised pt to continue to take medications as instructed and monitor s/s of bleeding. He will continue to monitor bilateral foot pulses and report any concerns immediately. Inquires if Dr. Fletcher Anon can remove staples. Advised pt to contact PCP. He verbalized understanding and is agreeable w/plan.

## 2016-06-09 NOTE — Telephone Encounter (Signed)
Pt states he had a piece of metal cut both his legs on 10/26.  States he had to have 15 staples in his legs. States he has a blockage in his right thigh. States this morning that leg is throbbing. Pt is on blood thinner. States Bleeding has stopped. Please call, pt is concerned about the throbbing in his leg.

## 2016-06-10 ENCOUNTER — Telehealth: Payer: Self-pay | Admitting: Endocrinology

## 2016-06-10 NOTE — Telephone Encounter (Signed)
I contacted the patient's wife and advised of message. Patient scheduled for 06/11/2016 at 1015 am.

## 2016-06-10 NOTE — Telephone Encounter (Signed)
Ov tomorrow 

## 2016-06-10 NOTE — Telephone Encounter (Signed)
See message and please advise, Thanks!  

## 2016-06-10 NOTE — Telephone Encounter (Signed)
Pt wife called in and was concerned about the cuts on his legs, saying that the still look very red and tender from the accident, and that he is almost out of his Antibiotics (Keflex) but also she is almost out of dressings for the wounds and wants to know if she needs to leave the dressing off of the legs or get more, and also if he should have more antibiotics.  She said she just needs some direction as to where to go from here.

## 2016-06-11 ENCOUNTER — Encounter: Payer: Self-pay | Admitting: Endocrinology

## 2016-06-11 ENCOUNTER — Ambulatory Visit (INDEPENDENT_AMBULATORY_CARE_PROVIDER_SITE_OTHER): Payer: Medicare Other | Admitting: Endocrinology

## 2016-06-11 DIAGNOSIS — S81811D Laceration without foreign body, right lower leg, subsequent encounter: Secondary | ICD-10-CM

## 2016-06-11 DIAGNOSIS — S81811A Laceration without foreign body, right lower leg, initial encounter: Secondary | ICD-10-CM | POA: Insufficient documentation

## 2016-06-11 MED ORDER — INSULIN GLARGINE 100 UNIT/ML SOLOSTAR PEN: 75.0000 [IU] | PEN_INJECTOR | SUBCUTANEOUS | 99 refills | Status: DC

## 2016-06-11 MED ORDER — DOXYCYCLINE HYCLATE 100 MG PO TABS: 100.0000 mg | ORAL_TABLET | Freq: Two times a day (BID) | ORAL | 0 refills | Status: DC

## 2016-06-11 NOTE — Progress Notes (Signed)
Subjective:    Patient ID: [name], male    DOB: [birth year], 60 y.o.   MRN: [id number]  HPI Pt returns for f/u of diabetes mellitus: DM type: Insulin-requiring type 2 Dx'ed: 0000000 Complications: PAD and polyneuropathy.  Therapy: insulin since 2009 DKA: never Severe hypoglycemia: never Pancreatitis: never Other: he has been on a QD insulin regimen, after poor results with multiple daily injections, and then with BID insulin.   Interval history: he takes 70 units qam.  no cbg record, but states cbg's are in the mid-100's. 1 week ago, pt scraped both legs with with a piece of metal.  He was seen in ER, and the right leg wound was closed with staples.  He was also rx'ed keflex.  Since then, he has moderate pain and assoc swelling.   Past Medical History:  Diagnosis Date  . Allergic rhinitis   . Arthritis   . At risk for sleep apnea    STOP-BANG= 5   SENT TO PCP 03-14-2014  . Condyloma acuminatum of penis   . Diabetic neuropathy ([place])   . GERD (gastroesophageal reflux disease)   . History of bladder cancer    s/p  turbt  2013/   transitional cell carcinoma--   . History of condyloma acuminatum    PERINEAL AREA  W/ RECURRENCY  . History of gout   . Hyperlipidemia   . Hypertension   . Lower urinary tract symptoms (LUTS)   . Productive cough   . PVD (peripheral vascular disease) with claudication (HCC)    bilateral SFA disease-- right > left  and left tibial artery disease--  per duplex  . Smokers' cough ([place])   . Type 2 diabetes mellitus with insulin therapy ([place])    monitor by  dr ellsion  . Wears dentures    upper    Past Surgical History:  Procedure Laterality Date  . AXILLARY HIDRADENITIS EXCISION  1997  . CARDIOVASCULAR STRESS TEST  07-24-2014  dr Kathlyn [place]   Low risk lexiscan nuclear study with apical thinning and small inferolateral wall infarct at mid & basal level , no ischemia/  normal LVF and wall motion , ef 59%  . CATARACT EXTRACTION W/ INTRAOCULAR  LENS IMPLANT Right   . CO2 LASER APPLICATION N/A 0000000   Procedure: CO2 LASER APPLICATION,PENIS, GROIN, ANUS;  Surgeon: [name], MD;  Location: [facility];  Service: General;  Laterality: N/A;  . CO2 LASER APPLICATION N/A 123XX123   Procedure: CO2 LASER APPLICATION;  Surgeon: Kathie Rhodes, MD;  Location: [facility];  Service: Urology;  Laterality: N/A;  . CONDYLOMA EXCISION/FULGURATION N/A 05/21/2015   Procedure: CONDYLOMA REMOVAL;  Surgeon: Kathie Rhodes, MD;  Location: [facility];  Service: Urology;  Laterality: N/A;  . HEMORRHOID SURGERY  10/24/2014   Procedure: HEMORRHOIDECTOMY;  Surgeon: Michael Boston, MD;  Location: [facility];  Service: General;;  . INCISION AND DRAINAGE ABSCESS Left 10/24/2014   Procedure: INCISION AND DRAINAGE ABSCESS;  Surgeon: Michael Boston, MD;  Location: [facility];  Service: General;  Laterality: Left;  . INGUINAL HIDRADENITIS EXCISION  1998, 1999  . LASER ABLATION CONDOLAMATA N/A 03/20/2014   Procedure: EXAM UNDER ANESTHESIA, REMOVAL/ABLATION OF CONDYLOMATA PENIS,GROINS, ANUS, ANAL CANAL;  Surgeon: [name], MD;  Location: [facility];  Service: General;  Laterality: N/A;  groin and anus  . LASER ABLATION CONDOLAMATA N/A 10/24/2014   Procedure: LASER ABLATION CONDOLAMATA;  Surgeon: Michael Boston, MD;  Location: [facility];  Service: General;  Laterality: N/A;  . LASER ABLATION OF PENILE AND PERIANAL WARTS  07-29-2007  Dr. Johney Maine  . LEFT SHOULDER SURGERY  2003  . MASS EXCISION N/A 10/24/2014   Procedure: EXCISION OF PERINEAL MASS/SINUS;  Surgeon: Michael Boston, MD;  Location: [facility];  Service: General;  Laterality: N/A;  . [place]  . TRANSURETHRAL RESECTION OF BLADDER TUMOR  05/21/2012   Procedure: TRANSURETHRAL RESECTION OF BLADDER TUMOR (TURBT);  Surgeon: Claybon Jabs, MD;  Location:  [facility];  Service: Urology;  Laterality: N/A;       Social History   Social History  . Marital status: Married    Spouse name: N/A  . Number of children: N/A  . Years of education: N/A   Occupational History  . Disabled    Social History Main Topics  . Smoking status: Current Every Day Smoker    Packs/day: 1.00    Years: 41.00    Types: Cigars, Cigarettes  . Smokeless tobacco: Never Used  . Alcohol use No  . Drug use: No  . Sexual activity: Not on file   Other Topics Concern  . Not on file   Social History Narrative  . No narrative on file    Current Outpatient Prescriptions on File Prior to Visit  Medication Sig Dispense Refill  . atorvastatin (LIPITOR) 20 MG tablet TAKE ONE TABLET BY MOUTH ONCE DAILY 90 tablet 1  . cephALEXin (KEFLEX) 500 MG capsule Take 1 capsule (500 mg total) by mouth 4 (four) times daily. 20 capsule 0  . cilostazol (PLETAL) 100 MG tablet Take 1 tablet (100 mg total) by mouth 2 (two) times daily. 60 tablet 11  . gabapentin (NEURONTIN) 300 MG capsule Take 1 capsule (300 mg total) by mouth 2 (two) times daily. 60 capsule 5  . HYDROcodone-acetaminophen (NORCO/VICODIN) 5-325 MG tablet Take 1 tablet by mouth every 6 (six) hours as needed for moderate pain. 100 tablet 0  . losartan-hydrochlorothiazide (HYZAAR) 100-12.5 MG tablet Take 1 tablet by mouth daily. 90 tablet 1   No current facility-administered medications on file prior to visit.     No Known Allergies  Family History  Problem Relation Age of Onset  . Hypertension Mother   . Cancer Father     lung ca  . Diabetes Maternal Aunt     x 2  . Colon cancer Neg Hx   . Esophageal cancer Neg Hx   . Pancreatic cancer Neg Hx   . Prostate cancer Neg Hx   . Kidney disease Neg Hx   . Liver disease Neg Hx   . Lung cancer Neg Hx     BP 132/82   Pulse 82   Ht 5\' 8"  (1.727 m)   Wt 209 lb (94.8 kg)   SpO2 96%   BMI 31.78 kg/m     Review of Systems Denies fever.  The  wound continues to weep serous fluid.      Objective:   Physical Exam VITAL SIGNS:  See vs page GENERAL: no distress Right leg: 22 cm laceration at the ant tibial area, with staples present.  There is slight erythema at the distal leg Pulses: dorsalis pedis intact bilat.  MSK: no deformity of the feet.  CV: trace bilat leg edema, and bilat varicosities.  Skin: no ulcer on the feet. normal color and temp on the feet.  Neuro: sensation is intact to touch on the  feet, but decreased from normal.  Ext: There is bilateral onychomycosis of the toenails.      Assessment & Plan:  Insulin-requiring type 2 DM, with PAD: he needs increased rx Laceration, new.  Patient is advised the following: Patient Instructions  I have sent a prescription to your pharmacy, for a different antibiotic. Please increase the insulin to 75 units each morning. Please come back for a follow-up appointment in 2 days, as scheduled.   check your blood sugar 4 times a day.  vary the time of day when you check, between before the 3 meals, and at bedtime.  also check if you have symptoms of your blood sugar being too high or too low.  please keep a record of the readings and bring it to your next appointment here (or you can bring the meter itself).  You can write it on any piece of paper.  please call us sooner if your blood sugar goes below 70, or if you have a lot of readings over 200. You will get better much faster if you elevate your foot above the rest of your body.

## 2016-06-11 NOTE — Patient Instructions (Addendum)
I have sent a prescription to your pharmacy, for a different antibiotic. Please increase the insulin to 75 units each morning. Please come back for a follow-up appointment in 2 days, as scheduled.   check your blood sugar 4 times a day.  vary the time of day when you check, between before the 3 meals, and at bedtime.  also check if you have symptoms of your blood sugar being too high or too low.  please keep a record of the readings and bring it to your next appointment here (or you can bring the meter itself).  You can write it on any piece of paper.  please call us sooner if your blood sugar goes below 70, or if you have a lot of readings over 200. You will get better much faster if you elevate your foot above the rest of your body.

## 2016-06-13 ENCOUNTER — Ambulatory Visit (INDEPENDENT_AMBULATORY_CARE_PROVIDER_SITE_OTHER): Payer: Medicare Other | Admitting: Endocrinology

## 2016-06-13 ENCOUNTER — Encounter: Payer: Self-pay | Admitting: Endocrinology

## 2016-06-13 VITALS — BP 130/80 | HR 74 | Ht 68.0 in | Wt 201.0 lb

## 2016-06-13 DIAGNOSIS — S81811D Laceration without foreign body, right lower leg, subsequent encounter: Secondary | ICD-10-CM | POA: Diagnosis not present

## 2016-06-13 NOTE — Patient Instructions (Addendum)
Please finish the antibiotic. Please continue the same insulin. please the wound specialist in 3 days, as scheduled.   check your blood sugar 4 times a day.  vary the time of day when you check, between before the 3 meals, and at bedtime.  also check if you have symptoms of your blood sugar being too high or too low.  please keep a record of the readings and bring it to your next appointment here (or you can bring the meter itself).  You can write it on any piece of paper.  please call us sooner if your blood sugar goes below 70, or if you have a lot of readings over 200. You will get better much faster if you continue to elevate your foot above the rest of your body.   Please come back for a follow-up appointment in 3 months.

## 2016-06-13 NOTE — Progress Notes (Signed)
Subjective:    Patient ID: [name], male    DOB: 10-10-1955, 60 y.o.   MRN: [id number]  HPI Pt returns for f/u of diabetes mellitus: DM type: Insulin-requiring type 2 Dx'ed: 0000000 Complications: PAD and polyneuropathy.  Therapy: insulin since 2009 DKA: never Severe hypoglycemia: never Pancreatitis: never Other: he has been on a QD insulin regimen, after poor results with multiple daily injections, and then with BID insulin.   Interval history: he takes 75 units qam.  Meter is downloaded today, and the printout is scanned into the record.  It varies from 70-330.  almost all are checked fasting.  He is also here to f/u wound and cellulitis of the right leg.  He has been keeping the right leg elevated.   Past Medical History:  Diagnosis Date  . Allergic rhinitis   . Arthritis   . At risk for sleep apnea    STOP-BANG= 5   SENT TO PCP 03-14-2014  . Condyloma acuminatum of penis   . Diabetic neuropathy ([place])   . GERD (gastroesophageal reflux disease)   . History of bladder cancer    s/p  turbt  2013/   transitional cell carcinoma--   . History of condyloma acuminatum    PERINEAL AREA  W/ RECURRENCY  . History of gout   . Hyperlipidemia   . Hypertension   . Lower urinary tract symptoms (LUTS)   . Productive cough   . PVD (peripheral vascular disease) with claudication ([facility])    bilateral SFA disease-- right > left  and left tibial artery disease--  per duplex  . Smokers' cough ([place])   . Type 2 diabetes mellitus with insulin therapy ([place])    monitor by  dr ellsion  . Wears dentures    upper    Past Surgical History:  Procedure Laterality Date  . AXILLARY HIDRADENITIS EXCISION  1997  . CARDIOVASCULAR STRESS TEST  07-24-2014  dr Kathlyn [place]   Low risk lexiscan nuclear study with apical thinning and small inferolateral wall infarct at mid & basal level , no ischemia/  normal LVF and wall motion , ef 59%  . CATARACT EXTRACTION W/ INTRAOCULAR LENS IMPLANT Right   . CO2  LASER APPLICATION N/A 0000000   Procedure: CO2 LASER APPLICATION,PENIS, GROIN, ANUS;  Surgeon: [name], MD;  Location: [facility];  Service: General;  Laterality: N/A;  . CO2 LASER APPLICATION N/A [id number]   Procedure: CO2 LASER APPLICATION;  Surgeon: Kathie Rhodes, MD;  Location[facility];  Service: Urology;  Laterality: N/A;  . CONDYLOMA EXCISION/FULGURATION N/A 05/21/2015   Procedure: CONDYLOMA REMOVAL;  Surgeon: Kathie Rhodes, MD;  Location: [facility];  Service: Urology;  Laterality: N/A;  . HEMORRHOID SURGERY  10/24/2014   Procedure: HEMORRHOIDECTOMY;  Surgeon: Michael Boston, MD;  Location: [facility];  Service: General;;  . INCISION AND DRAINAGE ABSCESS Left 10/24/2014   Procedure: INCISION AND DRAINAGE ABSCESS;  Surgeon: Michael Boston, MD;  Location: [place];  Service: General;  Laterality: Left;  . INGUINAL HIDRADENITIS EXCISION  1998, 1999  . LASER ABLATION CONDOLAMATA N/A 03/20/2014   Procedure: EXAM UNDER ANESTHESIA, REMOVAL/ABLATION OF CONDYLOMATA PENIS,GROINS, ANUS, ANAL CANAL;  Surgeon: [name], MD;  Location: [place];  Service: General;  Laterality: N/A;  groin and anus  . LASER ABLATION CONDOLAMATA N/A 10/24/2014   Procedure: LASER ABLATION CONDOLAMATA;  Surgeon: Michael Boston, MD;  Location: [facility];  Service:  General;  Laterality: N/A;  . LASER ABLATION OF PENILE AND PERIANAL WARTS  07-29-2007  Dr. Johney Maine  . LEFT SHOULDER SURGERY  2003  . MASS EXCISION N/A 10/24/2014   Procedure: EXCISION OF PERINEAL MASS/SINUS;  Surgeon: Michael Boston, MD;  Location: [place];  Service: General;  Laterality: N/A;  . [place]  . TRANSURETHRAL RESECTION OF BLADDER TUMOR  05/21/2012   Procedure: TRANSURETHRAL RESECTION OF BLADDER TUMOR (TURBT);  Surgeon: Claybon Jabs, MD;  Location: [facility];   Service: Urology;  Laterality: N/A;       Social History   Social History  . Marital status: Married    Spouse name: N/A  . Number of children: N/A  . Years of education: N/A   Occupational History  . Disabled    Social History Main Topics  . Smoking status: Current Every Day Smoker    Packs/day: 1.00    Years: 41.00    Types: Cigars, Cigarettes  . Smokeless tobacco: Never Used  . Alcohol use No  . Drug use: No  . Sexual activity: Not on file   Other Topics Concern  . Not on file   Social History Narrative  . No narrative on file    Current Outpatient Prescriptions on File Prior to Visit  Medication Sig Dispense Refill  . atorvastatin (LIPITOR) 20 MG tablet TAKE ONE TABLET BY MOUTH ONCE DAILY 90 tablet 1  . cephALEXin (KEFLEX) 500 MG capsule Take 1 capsule (500 mg total) by mouth 4 (four) times daily. 20 capsule 0  . cilostazol (PLETAL) 100 MG tablet Take 1 tablet (100 mg total) by mouth 2 (two) times daily. 60 tablet 11  . doxycycline (VIBRA-TABS) 100 MG tablet Take 1 tablet (100 mg total) by mouth 2 (two) times daily. 20 tablet 0  . gabapentin (NEURONTIN) 300 MG capsule Take 1 capsule (300 mg total) by mouth 2 (two) times daily. 60 capsule 5  . HYDROcodone-acetaminophen (NORCO/VICODIN) 5-325 MG tablet Take 1 tablet by mouth every 6 (six) hours as needed for moderate pain. 100 tablet 0  . Insulin Glargine (LANTUS SOLOSTAR) 100 UNIT/ML Solostar Pen Inject 75 Units into the skin every morning. And pen needles 1/day 10 pen PRN  . losartan-hydrochlorothiazide (HYZAAR) 100-12.5 MG tablet Take 1 tablet by mouth daily. 90 tablet 1   No current facility-administered medications on file prior to visit.     No Known Allergies  Family History  Problem Relation Age of Onset  . Hypertension Mother   . Cancer Father     lung ca  . Diabetes Maternal Aunt     x 2  . Colon cancer Neg Hx   . Esophageal cancer Neg Hx   . Pancreatic cancer Neg Hx   . Prostate cancer Neg Hx   .  Kidney disease Neg Hx   . Liver disease Neg Hx   . Lung cancer Neg Hx     BP 130/80   Pulse 74   Ht 5\' 8"  (1.727 m)   Wt 201 lb (91.2 kg)   SpO2 95%   BMI 30.56 kg/m   Review of Systems Denies fever.     Objective:   Physical Exam VITAL SIGNS:  See vs page GENERAL: no distress Right leg: 22 cm laceration at the ant tibial area, with staples present.  Erythema/tend/swelling are all better;  Skin is wrinkled; Minimal drainage.   CV: trace bilat leg edema, and bilat varicosities.  Assessment & Plan:  Insulin-requiring type 2 DM, with PAD: glycemic control is apparently improved Wound/cellulitis: improved.    Patient is advised the following: Patient Instructions  Please finish the antibiotic. Please continue the same insulin. please the wound specialist in 3 days, as scheduled.   check your blood sugar 4 times a day.  vary the time of day when you check, between before the 3 meals, and at bedtime.  also check if you have symptoms of your blood sugar being too high or too low.  please keep a record of the readings and bring it to your next appointment here (or you can bring the meter itself).  You can write it on any piece of paper.  please call us sooner if your blood sugar goes below 70, or if you have a lot of readings over 200. You will get better much faster if you continue to elevate your foot above the rest of your body.   Please come back for a follow-up appointment in 3 months.

## 2016-06-16 ENCOUNTER — Encounter: Payer: Medicare Other | Attending: Surgery | Admitting: Surgery

## 2016-06-16 DIAGNOSIS — X58XXXA Exposure to other specified factors, initial encounter: Secondary | ICD-10-CM | POA: Diagnosis not present

## 2016-06-16 DIAGNOSIS — S81812A Laceration without foreign body, left lower leg, initial encounter: Secondary | ICD-10-CM | POA: Diagnosis not present

## 2016-06-16 DIAGNOSIS — Z8551 Personal history of malignant neoplasm of bladder: Secondary | ICD-10-CM | POA: Insufficient documentation

## 2016-06-16 DIAGNOSIS — E11621 Type 2 diabetes mellitus with foot ulcer: Secondary | ICD-10-CM | POA: Diagnosis not present

## 2016-06-16 DIAGNOSIS — E1140 Type 2 diabetes mellitus with diabetic neuropathy, unspecified: Secondary | ICD-10-CM | POA: Diagnosis not present

## 2016-06-16 DIAGNOSIS — S81811A Laceration without foreign body, right lower leg, initial encounter: Secondary | ICD-10-CM | POA: Insufficient documentation

## 2016-06-16 DIAGNOSIS — Z794 Long term (current) use of insulin: Secondary | ICD-10-CM | POA: Insufficient documentation

## 2016-06-16 DIAGNOSIS — L97821 Non-pressure chronic ulcer of other part of left lower leg limited to breakdown of skin: Secondary | ICD-10-CM | POA: Diagnosis not present

## 2016-06-16 DIAGNOSIS — L97919 Non-pressure chronic ulcer of unspecified part of right lower leg with unspecified severity: Secondary | ICD-10-CM | POA: Diagnosis not present

## 2016-06-16 DIAGNOSIS — E11622 Type 2 diabetes mellitus with other skin ulcer: Secondary | ICD-10-CM | POA: Diagnosis not present

## 2016-06-16 DIAGNOSIS — F17218 Nicotine dependence, cigarettes, with other nicotine-induced disorders: Secondary | ICD-10-CM | POA: Diagnosis not present

## 2016-06-16 DIAGNOSIS — Z85828 Personal history of other malignant neoplasm of skin: Secondary | ICD-10-CM | POA: Diagnosis not present

## 2016-06-16 DIAGNOSIS — L97811 Non-pressure chronic ulcer of other part of right lower leg limited to breakdown of skin: Secondary | ICD-10-CM | POA: Diagnosis not present

## 2016-06-16 DIAGNOSIS — Z87891 Personal history of nicotine dependence: Secondary | ICD-10-CM | POA: Diagnosis not present

## 2016-06-16 DIAGNOSIS — I10 Essential (primary) hypertension: Secondary | ICD-10-CM | POA: Diagnosis not present

## 2016-06-16 NOTE — Telephone Encounter (Signed)
Patient wife is having some confusion about her husband appointments times. Please advise

## 2016-06-16 NOTE — Progress Notes (Addendum)
[name] ([id number]) Visit Report for 06/16/2016 Chief Complaint Document Details Patient Name: [name], [name] Date of Service: 06/16/2016 8:45 AM Medical Record Number: [id number] Patient Account Number: [id number] Date of Birth/Sex: [birth year] (60 y.o. Male) Treating RN: Cornell Barman Primary Care Physician: [name] Other Clinician: Referring Physician: [name] Treating Physician/Extender: Frann Rider in Treatment: 0 Information Obtained from: Patient Chief Complaint Patients presents for treatment of an open diabetic ulcer and a recent laceration both lower extremities which happened on 06/05/2016 Electronic Signature(s) Signed: 06/16/2016 9:44:05 AM By: Christin Fudge MD, FACS Entered By: Christin Fudge on 06/16/2016 09:44:05 [name] ([id number]) -------------------------------------------------------------------------------- HPI Details Patient Name: [name] Date of Service: 06/16/2016 8:45 AM Medical Record Number: [id number] Patient Account Number: [id number] Date of Birth/Sex: [birth year] (60 y.o. Male) Treating RN: Cornell Barman Primary Care Physician: [name] Other Clinician: Referring Physician: [name] Treating Physician/Extender: Frann Rider in Treatment: 0 History of Present Illness Location: bilateral lower extremity lacerations with staples intact on the right lower extremity Quality: Patient reports experiencing a dull pain to affected area(s). Severity: Patient states wound (s) are getting better. Duration: Patient has had the wound for < 2 weeks prior to presenting for treatment Timing: Pain in wound is Intermittent (comes and goes Context: The wound occurred when the patient had lacerations with a metal sheet which had to be repaired in the ER Modifying Factors: Other treatment(s) tried include:as noted he has staples on the right lower extremity and has been on 2 courses of antibiotics Associated Signs and  Symptoms: Patient reports having increase swelling. HPI Description: 60 year old gentleman who is a patient of Dr. Nash Shearer and was seen by him recently on November 1 for a history of having scraped both legs with a piece of metal and was seen in the ER and the right leg was closed with staples. He was placed on Keflex and. Dr. Loanne Drilling treated his diabetes appropriately and also prescribed doxycycline to be taken. 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 he is status post axillary hidradenitis excision, cataract extraction,laser ablation of condyloma, hemorrhoid surgery, incision and drainage of abscess, transurethral resection of bladder tumor. he smokes a pack of cigarettes every day. the patient has been seen by Dr. Fletcher Anon in the past and he has had a workup done which was remote in 2015 which showed the right side showed a ABI of 0.44 and the left was normal. 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 No intervention was done and he was put on Cilostazol. Not find any electronic records of recent tests done since August 2015. Electronic Signature(s) Signed: 06/16/2016 9:52:57 AM By: Christin Fudge MD, FACS Previous Signature: 06/16/2016 9:52:44 AM Version By: Christin Fudge MD, FACS Previous Signature: 06/16/2016 8:45:25 AM Version By: Christin Fudge MD, FACS Entered By: Christin Fudge on 06/16/2016 09:52:57 [name] ([id number]) -------------------------------------------------------------------------------- Physical Exam Details Patient Name: [name] Date of Service: 06/16/2016 8:45 AM Medical Record Number: [id number] Patient Account  Number: [id number] Date of Birth/Sex: [birth year] (60 y.o. Male) Treating RN: Cornell Barman Primary Care Physician: [name] Other Clinician: Referring Physician: [name] Treating Physician/Extender: Frann Rider in Treatment: 0 Constitutional . Pulse regular. Respirations normal and  unlabored. Afebrile. . Eyes Nonicteric. Reactive to light. Ears, Nose, Mouth, and Throat Lips, teeth, and gums WNL.Marland Kitchen Moist mucosa without lesions. Neck supple and nontender. No palpable supraclavicular or cervical adenopathy. Normal sized without goiter. Respiratory WNL. No retractions.. Breath sounds WNL, No rubs, rales, rhonchi, or wheeze.. Cardiovascular Pedal Pulses WNL. ABI cannot be measured. Gastrointestinal (GI) Abdomen without masses or tenderness.. No liver or spleen enlargement or tenderness.. Lymphatic No adneopathy. No adenopathy. No adenopathy. Musculoskeletal Adexa without tenderness or enlargement.. Digits and nails w/o clubbing, cyanosis, infection, petechiae, ischemia, or inflammatory conditions.. Integumentary (Hair, Skin) No suspicious lesions. No crepitus or fluctuance. No peri-wound warmth or erythema. No masses.Marland Kitchen Psychiatric Judgement and insight Intact.. No evidence of depression, anxiety, or agitation.. Notes the patient has a long lacerated wound on the right lower extremity which has staples sensitive and has a lot of eschar which is stuck. The left leg has no staples what has a long eschar. Electronic Signature(s) Signed: 06/16/2016 9:54:02 AM By: Christin Fudge MD, FACS Entered By: Christin Fudge on 06/16/2016 09:54:01 [name] ([id number]) -------------------------------------------------------------------------------- Physician Orders Details Patient Name: [name] Date of Service: 06/16/2016 8:45 AM Medical Record Number: [id number] Patient Account Number: [id number] Date of Birth/Sex: 07-06-1956 (61 y.o. Male) Treating RN: Cornell Barman Primary  Care Physician: [name] Other Clinician: Referring Physician: [name] Treating Physician/Extender: Frann Rider in Treatment: 0 Verbal / Phone Orders: Yes Clinician: Cornell Barman Read Back and Verified: Yes Diagnosis Coding Wound Cleansing Wound #1 Right Lower Leg o Clean wound with Normal Saline. o May Shower, gently pat wound dry prior to applying new dressing. Wound #2 Left,Medial Lower Leg o Clean wound with Normal Saline. o May Shower, gently pat wound dry prior to applying new dressing. Anesthetic Wound #1 Right Lower Leg o Topical Lidocaine 4% cream applied to wound bed prior to debridement Wound #2 Left,Medial Lower Leg o Topical Lidocaine 4% cream applied to wound bed prior to debridement Primary Wound Dressing Wound #1 Right Lower Leg o Other: - Hydrogel Wound #2 Left,Medial Lower Leg o Other: - Hydrogel Secondary Dressing Wound #1 Right Lower Leg o Non-adherent pad Wound #2 Left,Medial Lower Leg o Non-adherent pad Dressing Change Frequency Wound #1 Right Lower Leg o Change dressing every day. Wound #2 Left,Medial Lower Leg o Change dressing every day. [name], [name] ([id number]) Follow-up Appointments Wound #1 Right Lower Leg o Return Appointment in 1 week. Wound #2 Left,Medial Lower Leg o Return Appointment in 1 week. Edema Control Wound #1 Right Lower Leg o Elevate legs to the level of the heart and pump ankles as often as possible Wound #2 Left,Medial Lower Leg o Elevate legs to the level of the heart and pump ankles as often as possible Additional Orders / Instructions Wound #1 Right Lower Leg o Stop Smoking o Activity as tolerated Wound #2 Left,Medial Lower Leg o Stop Smoking o Activity as tolerated Medications-please add to medication list. Wound #1 Right Lower Leg o Other: - hydrogel with silver Wound #2 Left,Medial Lower Leg o Other: - hydrogel with silver Patient  Medications Allergies: No Known Drug Allergies Notifications Medication Indication Start End Hydrogel 06/16/2016 DOSE miscellaneous unspec unspec gel - gel miscellaneous Electronic Signature(s) Signed: 06/16/2016 9:58:37 AM By: Christin Fudge MD, FACS Entered By: Christin Fudge on 06/16/2016 09:58:36 [name] ([id number]) -------------------------------------------------------------------------------- Problem List Details Patient Name: [name] Date of Service: 06/16/2016 8:45 AM Medical Record Number: [id number] Patient Account Number: [id number] Date of Birth/Sex: 1956/04/08 (60 y.o. Male)  Treating RN: Cornell Barman Primary Care Physician: [name] Other Clinician: Referring Physician: [name] Treating Physician/Extender: Frann Rider in Treatment: 0 Active Problems ICD-10 Encounter Code Description Active Date Diagnosis E11.621 Type 2 diabetes mellitus with foot ulcer 06/16/2016 Yes S81.811A Laceration without foreign body, right lower leg, initial 06/16/2016 Yes encounter S81.812A Laceration without foreign body, left lower leg, initial 06/16/2016 Yes encounter F17.218 Nicotine dependence, cigarettes, with other nicotine- 06/16/2016 Yes induced disorders Inactive Problems Resolved Problems Electronic Signature(s) Signed: 06/16/2016 9:43:43 AM By: Christin Fudge MD, FACS Entered By: Christin Fudge on 06/16/2016 09:43:43 [name] ([id number]) -------------------------------------------------------------------------------- Progress Note Details Patient Name: [name] Date of Service: 06/16/2016 8:45 AM Medical Record Number: [id number] Patient Account Number: [id number] Date of Birth/Sex: 08/02/1956 (60 y.o. Male) Treating RN: Cornell Barman Primary Care Physician: [name] Other Clinician: Referring Physician: [name] Treating Physician/Extender: Frann Rider in Treatment: 0 Subjective Chief Complaint Information obtained  from Patient Patients presents for treatment of an open diabetic ulcer and a recent laceration both lower extremities which happened on 06/05/2016 History of Present Illness (HPI) The following HPI elements were documented for the patient's wound: Location: bilateral lower extremity lacerations with staples intact on the right lower extremity Quality: Patient reports experiencing a dull pain to affected area(s). Severity: Patient states wound (s) are getting better. Duration: Patient has had the wound for < 2 weeks prior to presenting for treatment Timing: Pain in wound is Intermittent (comes and goes Context: The wound occurred when the patient had lacerations with a metal sheet which had to be repaired in the ER Modifying Factors: Other treatment(s) tried include:as noted he has staples on the right lower extremity and has been on 2 courses of antibiotics Associated Signs and Symptoms: Patient reports having increase swelling. 60 year old gentleman who is a patient of Dr. Nash Shearer and was seen by him recently on November 1 for a history of having scraped both legs with a piece of metal and was seen in the ER and the right leg was closed with staples. He was placed on Keflex and. Dr. Loanne Drilling treated his diabetes appropriately and also prescribed doxycycline to be taken. 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 he is status post axillary hidradenitis excision, cataract extraction,laser ablation of condyloma, hemorrhoid surgery, incision and drainage of abscess, transurethral resection of bladder tumor. he smokes a pack of cigarettes every day. the patient has been seen by Dr. Fletcher Anon in the past and he has had a workup done which was remote in 2015 which showed the right side  showed a ABI of 0.44 and the left was normal. 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 No intervention was done and he was put on Cilostazol. Not find any electronic records of recent tests done since August 2015. Wound History Patient presents with 2 open wounds that have been present for approximately 06/05/2016. Laboratory tests have not been performed in the last month. Patient reportedly has not tested positive for an antibiotic [name], [name]. ([id number]) resistant organism. Patient reportedly has not tested positive for osteomyelitis. Patient reportedly has had testing performed to evaluate circulation in the legs. Patient experiences the following problems associated with their wounds: infection. Patient History Information  obtained from Patient, Chart. Allergies No Known Drug Allergies Family History Cancer - Father, Hypertension - Mother, No family history of Diabetes, Heart Disease, Kidney Disease, Lung Disease, Seizures, Stroke, Thyroid Problems, Tuberculosis. Social History Current every day smoker - 20 daily, Marital Status - Married, Alcohol Use - Never, Drug Use - No History, Caffeine Use - Daily - Coffee. Medical History Eyes Patient has history of Cataracts - Surgery Right, Denies history of Glaucoma, Optic Neuritis Ear/Nose/Mouth/Throat Denies history of Chronic sinus problems/congestion, Middle ear problems Hematologic/Lymphatic Denies history of Anemia, Hemophilia, Human Immunodeficiency Virus, Lymphedema, Sickle Cell Disease Respiratory Denies history of Aspiration, Asthma, Chronic Obstructive Pulmonary Disease (COPD), Pneumothorax, Sleep Apnea, Tuberculosis Cardiovascular Patient has history of Deep Vein Thrombosis - Right Leg, Hypertension Denies history of Angina, Arrhythmia, Congestive Heart Failure, Coronary Artery  Disease, Hypotension, Myocardial Infarction, Peripheral Arterial Disease, Peripheral Venous Disease, Phlebitis, Vasculitis Gastrointestinal Denies history of Cirrhosis , Colitis, Crohn s, Hepatitis A, Hepatitis B, Hepatitis C Endocrine Patient has history of Type II Diabetes Denies history of Type I Diabetes Genitourinary Denies history of End Stage Renal Disease Immunological Denies history of Lupus Erythematosus, Raynaud s, Scleroderma Integumentary (Skin) Denies history of History of Burn, History of pressure wounds Musculoskeletal Denies history of Gout, Rheumatoid Arthritis, Osteoarthritis, Osteomyelitis Neurologic Patient has history of Neuropathy - Right toes Denies history of Dementia, Quadriplegia, Paraplegia, Seizure Disorder [name], [name] ([id number]) Oncologic Denies history of Received Chemotherapy, Received Radiation Psychiatric Denies history of Anorexia/bulimia, Confinement Anxiety Patient is treated with Insulin. Blood sugar is tested. Blood sugar results noted at the following times: Breakfast - 229. Medical And Surgical History Notes Constitutional Symptoms (General Health) DM II, Bladder Cancer 2013, Skin Cancer, Clot in Right Leg Review of Systems (ROS) Constitutional Symptoms (General Health) The patient has no complaints or symptoms. Eyes The patient has no complaints or symptoms. Ear/Nose/Mouth/Throat The patient has no complaints or symptoms. Hematologic/Lymphatic The patient has no complaints or symptoms. Respiratory Complains or has symptoms of Shortness of Breath. Cardiovascular Complains or has symptoms of LE edema. Denies complaints or symptoms of Chest pain. Gastrointestinal Complains or has symptoms of Frequent diarrhea. Endocrine The patient has no complaints or symptoms. Genitourinary The patient has no complaints or symptoms. Immunological The patient has no complaints or symptoms. Integumentary (Skin) Complains or has symptoms of  Wounds, Bleeding or bruising tendency. Denies complaints or symptoms of Breakdown, Swelling. Musculoskeletal The patient has no complaints or symptoms. Neurologic The patient has no complaints or symptoms. Oncologic Bladder Psychiatric Denies complaints or symptoms of Anxiety, Claustrophobia. Medications [name], [name] ([id number]) losartan 100 mg-hydrochlorothiazide 12.5 mg tablet oral tablet oral gabapentin 300 mg capsule oral capsule oral atorvastatin 20 mg tablet oral tablet oral cephalexin 500 mg capsule oral capsule oral Lantus Solostar 100 unit/mL (3 mL) subcutaneous insulin pen subcutaneous insulin pen subcutaneous hydrocodone 5 mg-acetaminophen 325 mg tablet oral tablet oral cilostazol 100 mg tablet oral tablet oral doxycycline hyclate 100 mg tablet oral tablet oral Objective Constitutional Pulse regular. Respirations normal and unlabored. Afebrile. Vitals Time Taken: 8:43 AM, Height: 68 in, Source: Stated, Weight: 202 lbs, Source: Stated, BMI: 30.7, Temperature: 97.7 F, Pulse: 74 bpm, Respiratory Rate: 16 breaths/min, Blood Pressure: 151/85 mmHg. Eyes Nonicteric. Reactive to light. Ears, Nose, Mouth, and Throat Lips, teeth, and gums WNL.Marland Kitchen Moist mucosa without lesions. Neck supple and nontender. No palpable supraclavicular or cervical adenopathy. Normal sized without goiter. Respiratory WNL. No retractions.. Breath sounds WNL, No rubs, rales, rhonchi, or wheeze.. Cardiovascular Pedal Pulses WNL. ABI cannot be  measured. Gastrointestinal (GI) Abdomen without masses or tenderness.. No liver or spleen enlargement or tenderness.. Lymphatic No adneopathy. No adenopathy. No adenopathy. Musculoskeletal Adexa without tenderness or enlargement.. Digits and nails w/o clubbing, cyanosis, infection, petechiae, ischemia, or inflammatory conditions.Marland Kitchen Psychiatric Judgement and insight Intact.. No evidence of depression, anxiety, or agitation.[name]  ([id number]) General Notes: the patient has a long lacerated wound on the right lower extremity which has staples sensitive and has a lot of eschar which is stuck. The left leg has no staples what has a long eschar. Integumentary (Hair, Skin) No suspicious lesions. No crepitus or fluctuance. No peri-wound warmth or erythema. No masses.. Wound #1 status is Open. Original cause of wound was Trauma. The wound is located on the Right Lower Leg. The wound measures 13cm length x 1cm width x 0.1cm depth; 10.21cm^2 area and 1.021cm^3 volume. The wound is limited to skin breakdown. There is no tunneling or undermining noted. There is a medium amount of sanguinous drainage noted. The wound margin is flat and intact. There is no granulation within the wound bed. There is a large (67-100%) amount of necrotic tissue within the wound bed including Eschar. The periwound skin appearance exhibited: Localized Edema, Scarring, Ecchymosis. The periwound skin appearance did not exhibit: Callus, Crepitus, Excoriation, Fluctuance, Friable, Induration, Rash, Dry/Scaly, Maceration, Moist, Atrophie Blanche, Cyanosis, Hemosiderin Staining, Mottled, Pallor, Rubor, Erythema. Periwound temperature was noted as No Abnormality. The periwound has tenderness on palpation. General Notes: Patient has 11 staples securing wound. Wound #2 status is Open. Original cause of wound was Trauma. The wound is located on the Left,Medial Lower Leg. The wound measures 9cm length x 0.5cm width x 0.1cm depth; 3.534cm^2 area and 0.353cm^3 volume. The wound is limited to skin breakdown. There is no tunneling or undermining noted. There is a none present amount of drainage noted. The wound margin is flat and intact. There is no granulation within the wound bed. There is a large (67-100%) amount of necrotic tissue within the wound bed including Eschar. The periwound skin appearance exhibited: Localized Edema, Scarring, Dry/Scaly, Ecchymosis. The  periwound skin appearance did not exhibit: Callus, Crepitus, Excoriation, Fluctuance, Friable, Induration, Rash, Maceration, Moist, Atrophie Blanche, Cyanosis, Hemosiderin Staining, Mottled, Pallor, Rubor, Erythema. Periwound temperature was noted as No Abnormality. The periwound has tenderness on palpation. Assessment Active Problems ICD-10 E11.621 - Type 2 diabetes mellitus with foot ulcer S81.811A - Laceration without foreign body, right lower leg, initial encounter S81.812A - Laceration without foreign body, left lower leg, initial encounter F17.218 - Nicotine dependence, cigarettes, with other nicotine-induced disorders [name], [name] ([id number]) this 60 year old patient with diabetes mellitus and peripheral vascular disease has had his most recent A1c to be about 8.2 and is under the care of Dr. Nash Shearer the endocrinologist. His lipase and lacerated wounds have been reviewed by me and I have recommended: 1. Hydrogel with silver to be applied daily and lightly wrapped with Kerlix 2. She can wash with soap and water 3. I have reviewed the electronic medical records and none of his vascular reports a recent the last one being done in 2015. 4. Past him to leave his staples intact and we will remove this over 2-3 weeks. 5. Smoking cessation has been discussed with him for 3 minutes and I have discussed the risk benefits alternatives and all the possible benefits from giving up smoking. He is rather reluctant to do this at the present time 6. Regular visits to the wound center Plan Wound Cleansing: Wound #1 Right  Lower Leg: Clean wound with Normal Saline. May Shower, gently pat wound dry prior to applying new dressing. Wound #2 Left,Medial Lower Leg: Clean wound with Normal Saline. May Shower, gently pat wound dry prior to applying new dressing. Anesthetic: Wound #1 Right Lower Leg: Topical Lidocaine 4% cream applied to wound bed prior to debridement Wound #2 Left,Medial  Lower Leg: Topical Lidocaine 4% cream applied to wound bed prior to debridement Primary Wound Dressing: Wound #1 Right Lower Leg: Other: - Hydrogel Wound #2 Left,Medial Lower Leg: Other: - Hydrogel Secondary Dressing: Wound #1 Right Lower Leg: Non-adherent pad Wound #2 Left,Medial Lower Leg: Non-adherent pad Dressing Change Frequency: Wound #1 Right Lower Leg: Change dressing every day. Wound #2 Left,Medial Lower Leg: Change dressing every day. Follow-up Appointments: Wound #1 Right Lower Leg: Return Appointment in 1 week. Wound #2 Left,Medial Lower Leg: Return Appointment in 1 week. Edema Control: Wound #1 Right Lower Leg: [name], [name]. ([id number]) Elevate legs to the level of the heart and pump ankles as often as possible Wound #2 Left,Medial Lower Leg: Elevate legs to the level of the heart and pump ankles as often as possible Additional Orders / Instructions: Wound #1 Right Lower Leg: Stop Smoking Activity as tolerated Wound #2 Left,Medial Lower Leg: Stop Smoking Activity as tolerated Medications-please add to medication list.: Wound #1 Right Lower Leg: Other: - hydrogel with silver Wound #2 Left,Medial Lower Leg: Other: - hydrogel with silver The following medication(s) was prescribed: Hydrogel miscellaneous unspec unspec gel gel miscellaneous starting 06/16/2016 this 60 year old patient with diabetes mellitus and peripheral vascular disease has had his most recent A1c to be about 8.2 and is under the care of Dr. Nash Shearer the endocrinologist. His lipase and lacerated wounds have been reviewed by me and I have recommended: 1. Hydrogel with silver to be applied daily and lightly wrapped with Kerlix 2. She can wash with soap and water 3. I have reviewed the electronic medical records and none of his vascular reports a recent the last one being done in 2015. 4. Past him to leave his staples intact and we will remove this over 2-3 weeks. 5. Smoking cessation  has been discussed with him for 3 minutes and I have discussed the risk benefits alternatives and all the possible benefits from giving up smoking. He is rather reluctant to do this at the present time 6. Regular visits to the wound center Electronic Signature(s) Signed: 06/16/2016 9:59:04 AM By: Christin Fudge MD, FACS Previous Signature: 06/16/2016 9:56:45 AM Version By: Christin Fudge MD, FACS Entered By: Christin Fudge on 06/16/2016 09:59:04 [name] ([id number]) -------------------------------------------------------------------------------- ROS/PFSH Details Patient Name: [name] Date of Service: 06/16/2016 8:45 AM Medical Record Number: [id number] Patient Account Number: [id number] Date of Birth/Sex: [birth year] (60 y.o. Male) Treating RN: Cornell Barman Primary Care Physician: [name] Other Clinician: Referring Physician: [name] Treating Physician/Extender: Frann Rider in Treatment: 0 Information Obtained From Patient Chart Wound History Do you currently have one or more open woundso Yes How many open wounds do you currently haveo 2 Approximately how long have you had your woundso 06/05/2016 Has your wound(s) ever healed and then re-openedo No Have you had any lab work done in the past montho No Have you tested positive for an antibiotic resistant organism (MRSA, VRE)o No Have you tested positive for osteomyelitis (bone infection)o No Have you had any tests for circulation on your legso Yes Who ordered the testo Dr. Fletcher Anon Have you had other problems associated with your woundso  Infection Respiratory Complaints and Symptoms: Positive for: Shortness of Breath Medical History: Negative for: Aspiration; Asthma; Chronic Obstructive Pulmonary Disease (COPD); Pneumothorax; Sleep Apnea; Tuberculosis Cardiovascular Complaints and Symptoms: Positive for: LE edema Negative for: Chest pain Medical History: Positive for: Deep Vein Thrombosis - Right Leg;  Hypertension Negative for: Angina; Arrhythmia; Congestive Heart Failure; Coronary Artery Disease; Hypotension; Myocardial Infarction; Peripheral Arterial Disease; Peripheral Venous Disease; Phlebitis; Vasculitis Gastrointestinal Complaints and Symptoms: Positive for: Frequent diarrhea Medical History: Negative for: Cirrhosis ; Colitis; Crohnos; Hepatitis A; Hepatitis B; Hepatitis C [name], [name]. ([id number]) Integumentary (Skin) Complaints and Symptoms: Positive for: Wounds; Bleeding or bruising tendency Negative for: Breakdown; Swelling Medical History: Negative for: History of Burn; History of pressure wounds Psychiatric Complaints and Symptoms: Negative for: Anxiety; Claustrophobia Medical History: Negative for: Anorexia/bulimia; Confinement Anxiety Constitutional Symptoms (General Health) Complaints and Symptoms: No Complaints or Symptoms Medical History: Past Medical History Notes: DM II, Bladder Cancer 2013, Skin Cancer, Clot in Right Leg Eyes Complaints and Symptoms: No Complaints or Symptoms Medical History: Positive for: Cataracts - Surgery Right, Negative for: Glaucoma; Optic Neuritis Ear/Nose/Mouth/Throat Complaints and Symptoms: No Complaints or Symptoms Medical History: Negative for: Chronic sinus problems/congestion; Middle ear problems Hematologic/Lymphatic Complaints and Symptoms: No Complaints or Symptoms Medical History: Negative for: Anemia; Hemophilia; Human Immunodeficiency Virus; Lymphedema; Sickle Cell Disease Endocrine [name], [name] ([id number]) Complaints and Symptoms: No Complaints or Symptoms Medical History: Positive for: Type II Diabetes Negative for: Type I Diabetes Time with diabetes: 20+ Treated with: Insulin Blood sugar tested every day: Yes Tested : as needed Blood sugar testing results: Breakfast: 229 Genitourinary Complaints and Symptoms: No Complaints or Symptoms Medical History: Negative for: End Stage Renal  Disease Immunological Complaints and Symptoms: No Complaints or Symptoms Medical History: Negative for: Lupus Erythematosus; Raynaudos; Scleroderma Musculoskeletal Complaints and Symptoms: No Complaints or Symptoms Medical History: Negative for: Gout; Rheumatoid Arthritis; Osteoarthritis; Osteomyelitis Neurologic Complaints and Symptoms: No Complaints or Symptoms Medical History: Positive for: Neuropathy - Right toes Negative for: Dementia; Quadriplegia; Paraplegia; Seizure Disorder Oncologic Complaints and Symptoms: Review of System Notes: Bladder [name], [name] ([id number]) Medical History: Negative for: Received Chemotherapy; Received Radiation HBO Extended History Items Eyes: Cataracts Immunizations Pneumococcal Vaccine: Received Pneumococcal Vaccination: Yes Family and Social History Cancer: Yes - Father; Diabetes: No; Heart Disease: No; Hypertension: Yes - Mother; Kidney Disease: No; Lung Disease: No; Seizures: No; Stroke: No; Thyroid Problems: No; Tuberculosis: No; Current every day smoker - 20 daily; Marital Status - Married; Alcohol Use: Never; Drug Use: No History; Caffeine Use: Daily - Coffee; Advanced Directives: No; Patient does not want information on Advanced Directives; Do not resuscitate: No; Living Will: No; Medical Power of Attorney: No Physician Affirmation I have reviewed and agree with the above information. Electronic Signature(s) Signed: 06/16/2016 1:18:17 PM By: Gretta Cool RN, BSN, Kim RN, BSN Signed: 06/16/2016 4:01:52 PM By: Christin Fudge MD, FACS Entered By: Christin Fudge on 06/16/2016 09:42:24 [name], BERTOT ([id number]) -------------------------------------------------------------------------------- [place] Details Patient Name: [name] Date of Service: 06/16/2016 Medical Record Number: [id number] Patient Account Number: [id number] Date of Birth/Sex: [birth year] (60 y.o. Male) Treating RN: Cornell Barman Primary Care Physician: [name] Other Clinician: Referring Physician: [name] Treating Physician/Extender: Frann Rider in Treatment: 0 Diagnosis Coding ICD-10 Codes Code Description E11.621 Type 2 diabetes mellitus with foot ulcer S81.811A Laceration without foreign body, right lower leg, initial encounter S81.812A Laceration without foreign body, left lower leg, initial encounter F17.218 Nicotine dependence, cigarettes, with other nicotine-induced disorders Facility Procedures CPT4 Code Description:  [id number] - WOUND CARE VISIT-LEV 5 EST PT Modifier: Quantity: 1 CPT4 Code Description: [id number] CESSATION 3-10MINS ICD-10 Description Diagnosis E11.621 Type 2 diabetes mellitus with foot ulcer S81.811A Laceration without foreign body, right lower leg, initi F17.218 Nicotine dependence, cigarettes, with  other nicotine-in Modifier: al encounte duced disor Quantity: 1 r ders Physician Procedures CPT4 Code Description: [id number] - WC PHYS LEVEL 4 - NEW PT ICD-10 Description Diagnosis E11.621 Type 2 diabetes mellitus with foot ulcer S81.811A Laceration without foreign body, right lower leg, initi S81.812A Laceration without foreign body, left  lower leg, initia F17.218 Nicotine dependence, cigarettes, with other nicotine-in Modifier: al encounte l encounter duced disor Quantity: 1 r ders CPT4 Code Description: [id number] SMOKING CESSATION 3-10 MINS ICD-10 Description Diagnosis E11.621 Type 2 diabetes mellitus with foot ulcer S81.811A Laceration without foreign body, right lower leg, initi F17.218 Nicotine dependence, cigarettes, with  other nicotine-in LOOMIS, YARBER ([id number]) Modifier: al encounte duced disor Quantity: 1 r ders Electronic Signature(s) Signed: 06/16/2016 10:01:46 AM By: Christin Fudge MD, FACS Entered By: Christin Fudge on 06/16/2016 10:01:46

## 2016-06-16 NOTE — Progress Notes (Signed)
MESIAH, MACAK ([id number]) Visit Report for 06/16/2016 Allergy List Details Patient Name: [name], [name] Date of Service: 06/16/2016 8:45 AM Medical Record Number: [id number] Patient Account Number: [id number] Date of Birth/Sex: 02-27-1956 (60 y.o. Male) Treating RN: Cornell Barman Primary Care Physician: [name] Other Clinician: Referring Physician: [name] Treating Physician/Extender: Frann Rider in Treatment: 0 Allergies Active Allergies No Known Drug Allergies Allergy Notes Electronic Signature(s) Signed: 06/16/2016 1:18:17 PM By: Gretta Cool, RN, BSN, Kim RN, BSN Entered By: Gretta Cool, RN, BSN, Kim on 06/16/2016 09:01:53 [name] ([id number]) -------------------------------------------------------------------------------- Arrival Information Details Patient Name: [name] Date of Service: 06/16/2016 8:45 AM Medical Record Number: [id number] Patient Account Number: [id number] Date of Birth/Sex: Jun 14, 1956 (60 y.o. Male) Treating RN: Cornell Barman Primary Care Physician: [name] Other Clinician: Referring Physician: [name] Treating Physician/Extender: Frann Rider in Treatment: 0 Visit Information Patient Arrived: Ambulatory Arrival Time: 08:42 Accompanied By: wife Transfer Assistance: None Patient Identification Verified: Yes Secondary Verification Process Yes Completed: Patient Has Alerts: Yes Patient Alerts: Patient on Blood Thinner DM Type II Electronic Signature(s) Signed: 06/16/2016 9:42:09 AM By: Christin Fudge MD, FACS Entered By: Christin Fudge on 06/16/2016 09:42:09 [name] ([id number]) -------------------------------------------------------------------------------- Clinic Level of Care Assessment Details Patient Name: [name] Date of Service: 06/16/2016 8:45 AM Medical Record Number: [id number] Patient Account Number: [id number] Date of Birth/Sex: 1955-11-03 (60 y.o. Male) Treating RN: Cornell Barman Primary Care  Physician: [name] Other Clinician: Referring Physician: [name] Treating Physician/Extender: Frann Rider in Treatment: 0 Clinic Level of Care Assessment Items TOOL 2 Quantity Score []  - Use when only an EandM is performed on the INITIAL visit 0 ASSESSMENTS - Nursing Assessment / Reassessment X - General Physical Exam (combine w/ comprehensive assessment (listed just 1 20 below) when performed on new pt. evals) X - Comprehensive Assessment (HX, ROS, Risk Assessments, Wounds Hx, etc.) 1 25 ASSESSMENTS - Wound and Skin Assessment / Reassessment []  - Simple Wound Assessment / Reassessment - one wound 0 X - Complex Wound Assessment / Reassessment - multiple wounds 2 5 []  - Dermatologic / Skin Assessment (not related to wound area) 0 ASSESSMENTS - Ostomy and/or Continence Assessment and Care []  - Incontinence Assessment and Management 0 []  - Ostomy Care Assessment and Management (repouching, etc.) 0 PROCESS - Coordination of Care X - Simple Patient / Family Education for ongoing care 1 15 []  - Complex (extensive) Patient / Family Education for ongoing care 0 X - Staff obtains Programmer, systems, Records, Test Results / Process Orders 1 10 []  - Staff telephones HHA, Nursing Homes / Clarify orders / etc 0 []  - Routine Transfer to another Facility (non-emergent condition) 0 []  - Routine Hospital Admission (non-emergent condition) 0 X - New Admissions / Biomedical engineer / Ordering NPWT, Apligraf, etc. 1 15 []  - Emergency Hospital Admission (emergent condition) 0 X - Simple Discharge Coordination 1 10 [name], [name] ([id number]) []  - Complex (extensive) Discharge Coordination 0 PROCESS - Special Needs []  - Pediatric / Minor Patient Management 0 []  - Isolation Patient Management 0 []  - Hearing / Language / Visual special needs 0 []  - Assessment of Community assistance (transportation, D/C planning, etc.) 0 []  - Additional assistance / Altered mentation 0 []  - Support  Surface(s) Assessment (bed, cushion, seat, etc.) 0 INTERVENTIONS - Wound Cleansing / Measurement X - Wound Imaging (photographs - any number of wounds) 1 5 []  - Wound Tracing (instead of photographs) 0 []  - Simple Wound Measurement - one wound 0  X - Complex Wound Measurement - multiple wounds 2 5 []  - Simple Wound Cleansing - one wound 0 X - Complex Wound Cleansing - multiple wounds 2 5 INTERVENTIONS - Wound Dressings []  - Small Wound Dressing one or multiple wounds 0 X - Medium Wound Dressing one or multiple wounds 2 15 []  - Large Wound Dressing one or multiple wounds 0 []  - Application of Medications - injection 0 INTERVENTIONS - Miscellaneous []  - External ear exam 0 []  - Specimen Collection (cultures, biopsies, blood, body fluids, etc.) 0 []  - Specimen(s) / Culture(s) sent or taken to Lab for analysis 0 []  - Patient Transfer (multiple staff / Harrel Lemon Lift / Similar devices) 0 []  - Simple Staple / Suture removal (25 or less) 0 []  - Complex Staple / Suture removal (26 or more) 0 [name], Paul W. ([id number]) []  - Hypo / Hyperglycemic Management (close monitor of Blood Glucose) 0 []  - Ankle / Brachial Index (ABI) - do not check if billed separately 0 Has the patient been seen at the hospital within the last three years: Yes Total Score: 160 Level Of Care: New/Established - Level 5 Electronic Signature(s) Signed: 06/16/2016 1:18:17 PM By: Gretta Cool, RN, BSN, Kim RN, BSN Entered By: Gretta Cool, RN, BSN, Kim on 06/16/2016 09:45:55 [name], [name] ([id number]) -------------------------------------------------------------------------------- Encounter Discharge Information Details Patient Name: [name] Date of Service: 06/16/2016 8:45 AM Medical Record Number: [id number] Patient Account Number: [id number] Date of Birth/Sex: [birth year] (60 y.o. Male) Treating RN: Cornell Barman Primary Care Physician: [name] Other Clinician: Referring Physician: [name] Treating Physician/Extender:  Frann Rider in Treatment: 0 Encounter Discharge Information Items Discharge Pain Level: 0 Discharge Condition: Stable Ambulatory Status: Ambulatory Discharge Destination: Home Transportation: Private Auto Accompanied By: wife Schedule Follow-up Appointment: Yes Medication Reconciliation completed and provided to Patient/Care Yes [name]: Provided on Clinical Summary of Care: 06/16/2016 Form Type Recipient Paper Patient TW Electronic Signature(s) Signed: 06/16/2016 9:54:04 AM By: Ruthine Dose Entered By: Ruthine Dose on 06/16/2016 09:54:04 [name], [name] ([id number]) -------------------------------------------------------------------------------- Lower Extremity Assessment Details Patient Name: [name] Date of Service: 06/16/2016 8:45 AM Medical Record Number: [id number] Patient Account Number: [id number] Date of Birth/Sex: 24-Sep-1955 (60 y.o. Male) Treating RN: Cornell Barman Primary Care Physician: [name] Other Clinician: Referring Physician: [name] Treating Physician/Extender: Frann Rider in Treatment: 0 Edema Assessment Assessed: [Left: Yes] [Right: Yes] Edema: [Left: No] [Right: No] Calf Left: Right: Point of Measurement: 34 cm From Medial Instep 37.6 cm 37 cm Ankle Left: Right: Point of Measurement: 12 cm From Medial Instep 24 cm 23 cm Vascular Assessment Claudication: Claudication Assessment [Left:None] [Right:Intermittent] Pulses: Posterior Tibial Palpable: [Left:Yes] [Right:Yes] Doppler: [Left:Monophasic] [Right:Monophasic] Dorsalis Pedis Palpable: [Left:Yes] [Right:Yes] Doppler: [Left:Monophasic] [Right:Monophasic] Extremity colors, hair growth, and conditions: Extremity Color: [Left:Normal] [Right:Red] Hair Growth on Extremity: [Left:Yes] [Right:Yes] Temperature of Extremity: [Left:Warm] Capillary Refill: [Left:< 3 seconds] [Right:< 3 seconds] Dependent Rubor: [Left:No] [Right:No] Blanched when Elevated: [Left:No]  [Right:No] Lipodermatosclerosis: [Left:No] [Right:No] Toe Nail Assessment Left: Right: Thick: Yes Yes Discolored: Yes Yes Deformed: Yes Yes [name], [name] ([id number]) Improper Length and Hygiene: Yes Yes Electronic Signature(s) Signed: 06/16/2016 1:18:17 PM By: Gretta Cool, RN, BSN, Kim RN, BSN Entered By: Gretta Cool, RN, BSN, Kim on 06/16/2016 08:53:08 [name], [name] ([id number]) -------------------------------------------------------------------------------- Multi Wound Chart Details Patient Name: [name] Date of Service: 06/16/2016 8:45 AM Medical Record Number: [id number] Patient Account Number: [id number] Date of Birth/Sex: 07-Jan-1956 (60 y.o. Male) Treating RN: Cornell Barman Primary Care Physician: [name] Other Clinician: Referring Physician:  [name] Treating Physician/Extender: Frann Rider in Treatment: 0 Vital Signs Height(in): 68 Pulse(bpm): 74 Weight(lbs): 202 Blood Pressure 151/85 (mmHg): Body Mass Index(BMI): 31 Temperature(F): 97.7 Respiratory Rate 16 (breaths/min): Photos: [N/A:N/A] Wound Location: Right Lower Leg Left Lower Leg - Medial N/A Wounding Event: Trauma Trauma N/A Primary Etiology: Diabetic Wound/Ulcer of Diabetic Wound/Ulcer of N/A the Lower Extremity the Lower Extremity Secondary Etiology: Trauma, Other Trauma, Other N/A Comorbid History: Type II Diabetes Type II Diabetes N/A Date Acquired: 06/05/2016 06/05/2016 N/A Weeks of Treatment: 0 0 N/A Wound Status: Open Open N/A Measurements L x W x D 13x1x0.1 9x0.5x0.1 N/A (cm) Area (cm) : 10.21 3.534 N/A Volume (cm) : 1.021 0.353 N/A Classification: Grade 1 Grade 1 N/A Exudate Amount: Medium None Present N/A Exudate Type: Sanguinous N/A N/A Exudate Color: red N/A N/A Wound Margin: Flat and Intact Flat and Intact N/A Granulation Amount: None Present (0%) None Present (0%) N/A Necrotic Amount: Large (67-100%) Large (67-100%) N/A Necrotic Tissue: Eschar Eschar N/A Exposed  Structures: Fascia: No Fascia: No N/A Fat: No Fat: No Tendon: No Tendon: No [name], LAMKE. ([id number]) Muscle: No Muscle: No Joint: No Joint: No Bone: No Bone: No Limited to Skin Limited to Skin Breakdown Breakdown Epithelialization: None None N/A Periwound Skin Texture: Edema: Yes Edema: Yes N/A Scarring: Yes Scarring: Yes Excoriation: No Excoriation: No Induration: No Induration: No Callus: No Callus: No Crepitus: No Crepitus: No Fluctuance: No Fluctuance: No Friable: No Friable: No Rash: No Rash: No Periwound Skin Maceration: No Dry/Scaly: Yes N/A Moisture: Moist: No Maceration: No Dry/Scaly: No Moist: No Periwound Skin Color: Ecchymosis: Yes Ecchymosis: Yes N/A Atrophie Blanche: No Atrophie Blanche: No Cyanosis: No Cyanosis: No Erythema: No Erythema: No Hemosiderin Staining: No Hemosiderin Staining: No Mottled: No Mottled: No Pallor: No Pallor: No Rubor: No Rubor: No Temperature: No Abnormality No Abnormality N/A Tenderness on Yes Yes N/A Palpation: Wound Preparation: Ulcer Cleansing: Ulcer Cleansing: Not N/A Rinsed/Irrigated with Cleansed Saline Topical Anesthetic Applied: None Assessment Notes: Patient has 11 staples N/A N/A securing wound. Treatment Notes Electronic Signature(s) Signed: 06/16/2016 1:18:17 PM By: Gretta Cool, RN, BSN, Kim RN, BSN Entered By: Gretta Cool, RN, BSN, Kim on 06/16/2016 09:27:11 [name] ([id number]) -------------------------------------------------------------------------------- [place] Details Patient Name: [name], [name] Date of Service: 06/16/2016 8:45 AM Medical Record Number: [id number] Patient Account Number: [id number] Date of Birth/Sex: [birth year] (60 y.o. Male) Treating RN: Cornell Barman Primary Care Physician: [name] Other Clinician: Referring Physician: [name] Treating Physician/Extender: Frann Rider in Treatment: 0 Active Inactive Nutrition Nursing  Diagnoses: Impaired glucose control: actual or potential Goals: Patient/caregiver agrees to and verbalizes understanding of need to obtain nutritional consultation Date Initiated: 06/16/2016 Goal Status: Active Interventions: Assess HgA1c results as ordered upon admission and as needed Notes: Orientation to the Wound Care Program Nursing Diagnoses: Knowledge deficit related to the wound healing center program Goals: Patient/caregiver will verbalize understanding of the [facility] Date Initiated: 06/16/2016 Goal Status: Active Interventions: Provide education on orientation to the wound center Notes: Wound/Skin Impairment Nursing Diagnoses: Impaired tissue integrity Goals: Ulcer/skin breakdown will heal within 14 weeks Date Initiated: 06/16/2016 [name], [name] ([id number]) Goal Status: Active Interventions: Assess patient/caregiver ability to obtain necessary supplies Treatment Activities: Refer to smoking cessation program : 06/16/2016 Notes: Electronic Signature(s) Signed: 06/16/2016 1:18:17 PM By: Gretta Cool, RN, BSN, Kim RN, BSN Entered By: Gretta Cool, RN, BSN, Kim on 06/16/2016 09:26:16 [place], [name] ([id number]) -------------------------------------------------------------------------------- Pain Assessment Details Patient Name: [name] Date of Service: 06/16/2016  8:45 AM Medical Record Number: [id number] Patient Account Number: [id number] Date of Birth/Sex: 1956-04-11 (60 y.o. Male) Treating RN: Cornell Barman Primary Care Physician: [name] Other Clinician: Referring Physician: [name] Treating Physician/Extender: Frann Rider in Treatment: 0 Active Problems Location of Pain Severity and Description of Pain Patient Has Paino No Site Locations With Dressing Change: No Pain Management and Medication Current Pain Management: Electronic Signature(s) Signed: 06/16/2016 1:18:17 PM By: Gretta Cool, RN, BSN, Kim RN, BSN Entered By: Gretta Cool, RN,  BSN, Kim on 06/16/2016 09:15:00 [name] ([id number]) -------------------------------------------------------------------------------- Patient/Caregiver Education Details Patient Name: [name] Date of Service: 06/16/2016 8:45 AM Medical Record Number: [id number] Patient Account Number: [id number] Date of Birth/Gender: [birth year] (60 y.o. Male) Treating RN: Cornell Barman Primary Care Physician: [name] Other Clinician: Referring Physician: [name] Treating Physician/Extender: Frann Rider in Treatment: 0 Education Assessment Education Provided To: Patient Education Topics Provided Welcome To [facility]: Handouts: Welcome To [facility] Methods: Demonstration Wound/Skin Impairment: Handouts: Caring for Your Ulcer Methods: Demonstration Responses: State content correctly Electronic Signature(s) Signed: 06/16/2016 1:18:17 PM By: Gretta Cool, RN, BSN, Kim RN, BSN Entered By: Gretta Cool, RN, BSN, Kim on 06/16/2016 09:47:38 [name], [name] ([id number]) -------------------------------------------------------------------------------- Wound Assessment Details Patient Name: [name] Date of Service: 06/16/2016 8:45 AM Medical Record Number: [id number] Patient Account Number: [id number] Date of Birth/Sex: [birth year] (60 y.o. Male) Treating RN: Cornell Barman Primary Care Physician: [name] Other Clinician: Referring Physician: [name] Treating Physician/Extender: Frann Rider in Treatment: 0 Wound Status Wound Number: 1 Primary Etiology: Diabetic Wound/Ulcer of the Lower Extremity Wound Location: Right Lower Leg Secondary Trauma, Other Wounding Event: Trauma Etiology: Date Acquired: 06/05/2016 Wound Status: Open Weeks Of Treatment: 0 Comorbid Type II Diabetes Clustered Wound: No History: Photos Wound Measurements Length: (cm) 13 Width: (cm) 1 Depth: (cm) 0.1 Area: (cm) 10.21 Volume: (cm) 1.021 % Reduction in  Area: % Reduction in Volume: Epithelialization: None Tunneling: No Undermining: No Wound Description Classification: Grade 1 Wound Margin: Flat and Intact Exudate Amount: Medium Exudate Type: Sanguinous Exudate Color: red Foul Odor After Cleansing: No Wound Bed Granulation Amount: None Present (0%) Exposed Structure Necrotic Amount: Large (67-100%) Fascia Exposed: No Necrotic Quality: Eschar Fat Layer Exposed: No Tendon Exposed: No Muscle Exposed: No Joint Exposed: No Bone Exposed: No [name], MESSERLI. ([id number]) Limited to Skin Breakdown Periwound Skin Texture Texture Color No Abnormalities Noted: No No Abnormalities Noted: No Callus: No Atrophie Blanche: No Crepitus: No Cyanosis: No Excoriation: No Ecchymosis: Yes Fluctuance: No Erythema: No Friable: No Hemosiderin Staining: No Induration: No Mottled: No Localized Edema: Yes Pallor: No Rash: No Rubor: No Scarring: Yes Temperature / Pain Moisture Temperature: No Abnormality No Abnormalities Noted: No Tenderness on Palpation: Yes Dry / Scaly: No Maceration: No Moist: No Wound Preparation Ulcer Cleansing: Rinsed/Irrigated with Saline Assessment Notes Patient has 11 staples securing wound. Treatment Notes Wound #1 (Right Lower Leg) 1. Cleansed with: Clean wound with Normal Saline 2. Anesthetic Topical Lidocaine 4% cream to wound bed prior to debridement 4. Dressing Applied: Other dressing (specify in notes) 5. Secondary Dressing Applied Non-Adherent pad Notes Hyhdrogel secured with gauze and stretchnet Electronic Signature(s) Signed: 06/16/2016 1:18:17 PM By: Gretta Cool, RN, BSN, Kim RN, BSN Entered By: Gretta Cool, RN, BSN, Kim on 06/16/2016 08:59:12 [place], [name] ([id number]) -------------------------------------------------------------------------------- Wound Assessment Details Patient Name: [name] Date of Service: 06/16/2016 8:45 AM Medical Record Number: [id number] Patient Account Number:  [id number] Date of Birth/Sex: 1956-08-01 (60 y.o. Male) Treating  RN: Cornell Barman Primary Care Physician: [name] Other Clinician: Referring Physician: [name] Treating Physician/Extender: Frann Rider in Treatment: 0 Wound Status Wound Number: 2 Primary Etiology: Diabetic Wound/Ulcer of the Lower Extremity Wound Location: Left Lower Leg - Medial Secondary Trauma, Other Wounding Event: Trauma Etiology: Date Acquired: 06/05/2016 Wound Status: Open Weeks Of Treatment: 0 Comorbid Type II Diabetes Clustered Wound: No History: Photos Wound Measurements Length: (cm) 9 Width: (cm) 0.5 Depth: (cm) 0.1 Area: (cm) 3.534 Volume: (cm) 0.353 % Reduction in Area: % Reduction in Volume: Epithelialization: None Tunneling: No Undermining: No Wound Description Classification: Grade 1 Wound Margin: Flat and Intact Exudate Amount: None Present Foul Odor After Cleansing: No Wound Bed Granulation Amount: None Present (0%) Exposed Structure Necrotic Amount: Large (67-100%) Fascia Exposed: No Necrotic Quality: Eschar Fat Layer Exposed: No Tendon Exposed: No Muscle Exposed: No Joint Exposed: No Bone Exposed: No Limited to Skin Breakdown ZHAYNE, LOU. ([id number]) Periwound Skin Texture Texture Color No Abnormalities Noted: No No Abnormalities Noted: No Callus: No Atrophie Blanche: No Crepitus: No Cyanosis: No Excoriation: No Ecchymosis: Yes Fluctuance: No Erythema: No Friable: No Hemosiderin Staining: No Induration: No Mottled: No Localized Edema: Yes Pallor: No Rash: No Rubor: No Scarring: Yes Temperature / Pain Moisture Temperature: No Abnormality No Abnormalities Noted: No Tenderness on Palpation: Yes Dry / Scaly: Yes Maceration: No Moist: No Wound Preparation Ulcer Cleansing: Not Cleansed Topical Anesthetic Applied: None Treatment Notes Wound #2 (Left, Medial Lower Leg) 1. Cleansed with: Clean wound with Normal Saline 2.  Anesthetic Topical Lidocaine 4% cream to wound bed prior to debridement 4. Dressing Applied: Other dressing (specify in notes) 5. Secondary Dressing Applied Non-Adherent pad Notes Hyhdrogel secured with gauze and stretchnet Electronic Signature(s) Signed: 06/16/2016 1:18:17 PM By: Gretta Cool, RN, BSN, Kim RN, BSN Entered By: Gretta Cool, RN, BSN, Kim on 06/16/2016 [place], [place] ([id number]) -------------------------------------------------------------------------------- [place] Details Patient Name: [name] Date of Service: 06/16/2016 8:45 AM Medical Record Number: [id number] Patient Account Number: [id number] Date of Birth/Sex: 1956/04/06 (60 y.o. Male) Treating RN: Cornell Barman Primary Care Physician: [name] Other Clinician: Referring Physician: [name] Treating Physician/Extender: Frann Rider in Treatment: 0 Vital Signs Time Taken: 08:43 Temperature (F): 97.7 Height (in): 68 Pulse (bpm): 74 Source: Stated Respiratory Rate (breaths/min): 16 Weight (lbs): 202 Blood Pressure (mmHg): 151/85 Source: Stated Reference Range: 80 - 120 mg / dl Body Mass Index (BMI): 30.7 Electronic Signature(s) Signed: 06/16/2016 1:18:17 PM By: Gretta Cool, RN, BSN, Kim RN, BSN Entered By: Gretta Cool, RN, BSN, Kim on 06/16/2016 08:44:28

## 2016-06-16 NOTE — Progress Notes (Signed)
JEIREN, MIRCHANDANI ([id number]) Visit Report for 06/16/2016 Abuse/Suicide Risk Screen Details Patient Name: [name], [name] Date of Service: 06/16/2016 8:45 AM Medical Record Number: [id number] Patient Account Number: [id number] Date of Birth/Sex: July 07, 1956 (60 y.o. Male) Treating RN: Cornell Barman Primary Care Physician: [name] Other Clinician: Referring Physician: [name] Treating Physician/Extender: Frann Rider in Treatment: 0 Abuse/Suicide Risk Screen Items Answer ABUSE/SUICIDE RISK SCREEN: Has anyone close to you tried to hurt or harm you recentlyo No Do you feel uncomfortable with anyone in your familyo No Has anyone forced you do things that you didnot want to doo No Do you have any thoughts of harming yourselfo No Patient displays signs or symptoms of abuse and/or neglect. No Electronic Signature(s) Signed: 06/16/2016 1:18:17 PM By: Gretta Cool, RN, BSN, Kim RN, BSN Entered By: Gretta Cool, RN, BSN, Kim on 06/16/2016 09:09:48 [name] ([id number]) -------------------------------------------------------------------------------- Activities of Daily Living Details Patient Name: [name], [name] Date of Service: 06/16/2016 8:45 AM Medical Record Number: [id number] Patient Account Number: [id number] Date of Birth/Sex: 08-16-1955 (60 y.o. Male) Treating RN: Cornell Barman Primary Care Physician: [name] Other Clinician: Referring Physician: [name] Treating Physician/Extender: Frann Rider in Treatment: 0 Activities of Daily Living Items Answer Activities of Daily Living (Please select one for each item) Drive Automobile Completely Able Take Medications Completely Able Use Telephone Completely Able Care for Appearance Completely Able Use Toilet Completely Able Bath / Shower Completely Able Dress Self Completely Able Feed Self Completely Able Walk Completely Able Get In / Out Bed Completely [place] for Self Completely Able Electronic Signature(s) Signed: 06/16/2016 1:18:17 PM By: Gretta Cool, RN, BSN, Kim RN, BSN Entered By: Gretta Cool, RN, BSN, Kim on 06/16/2016 09:09:58 [name] ([id number]) -------------------------------------------------------------------------------- Education Assessment Details Patient Name: [name] Date of Service: 06/16/2016 8:45 AM Medical Record Number: [id number] Patient Account Number: [id number] Date of Birth/Sex: 1956/06/12 (60 y.o. Male) Treating RN: Cornell Barman Primary Care Physician: [name] Other Clinician: Referring Physician: [name] Treating Physician/Extender: Frann Rider in Treatment: 0 Primary Learner Assessed: Patient Learning Preferences/Education Level/Primary Language Learning Preference: Explanation, Demonstration Highest Education Level: High School Preferred Language: English Cognitive Barrier Assessment/Beliefs Language Barrier: No Translator Needed: No Memory Deficit: No Emotional Barrier: No Cultural/Religious Beliefs Affecting Medical No Care: Physical Barrier Assessment Impaired Vision: Yes Glasses Impaired Hearing: No Decreased Hand dexterity: No Knowledge/Comprehension Assessment Knowledge Level: High Comprehension Level: High Ability to understand written High instructions: Ability to understand verbal High instructions: Motivation Assessment Anxiety Level: Calm Cooperation: Cooperative Education Importance: Acknowledges Need Interest in Health Problems: Asks Questions Perception: Coherent Willingness to Engage in Self- High Management Activities: Readiness to Engage in Self- High Management Activities: Electronic Signature(s) [name], [name] ([id number]) Signed: 06/16/2016 1:18:17 PM By: Gretta Cool, RN, BSN, Kim RN, BSN Entered By: Gretta Cool, RN, BSN, Kim on 06/16/2016 09:10:50 [name], [name]  ([id number]) -------------------------------------------------------------------------------- Fall Risk Assessment Details Patient Name: [name] Date of Service: 06/16/2016 8:45 AM Medical Record Number: [id number] Patient Account Number: [id number] Date of Birth/Sex: 08/16/1955 (60 y.o. Male) Treating RN: Cornell Barman Primary Care Physician: [name] Other Clinician: Referring Physician: [name] Treating Physician/Extender: Frann Rider in Treatment: 0 Fall Risk Assessment Items Have you had 2 or more falls in the last 12 monthso 0 No Have you had any fall that resulted in injury in the last 12 monthso 0 No FALL RISK ASSESSMENT: History of falling - immediate or within  3 months 0 No Secondary diagnosis 0 No Ambulatory aid None/bed rest/wheelchair/nurse 0 Yes Crutches/cane/walker 0 No Furniture 0 No IV Access/Saline Lock 0 No Gait/Training Normal/bed rest/immobile 0 Yes Weak 0 No Impaired 0 No Mental Status Oriented to own ability 0 Yes Electronic Signature(s) Signed: 06/16/2016 1:18:17 PM By: Gretta Cool, RN, BSN, Kim RN, BSN Entered By: Gretta Cool, RN, BSN, Kim on 06/16/2016 09:11:08 [name] ([id number]) -------------------------------------------------------------------------------- Foot Assessment Details Patient Name: [name] Date of Service: 06/16/2016 8:45 AM Medical Record Number: [id number] Patient Account Number: [id number] Date of Birth/Sex: 1956/07/19 (60 y.o. Male) Treating RN: Cornell Barman Primary Care Physician: [name] Other Clinician: Referring Physician: [name] Treating Physician/Extender: Frann Rider in Treatment: 0 Foot Assessment Items Site Locations + = Sensation present, - = Sensation absent, C = Callus, U = Ulcer R = Redness, W = Warmth, M = Maceration, PU = Pre-ulcerative lesion F = Fissure, S = Swelling, D = Dryness Assessment Right: Left: Other Deformity: No No Prior Foot Ulcer: No No Prior  Amputation: No No Charcot Joint: No No Ambulatory Status: Ambulatory Without Help Gait: Steady Electronic Signature(s) Signed: 06/16/2016 1:18:17 PM By: Gretta Cool, RN, BSN, Kim RN, BSN Entered By: Gretta Cool, RN, BSN, Kim on 06/16/2016 09:14:42 [name], [name] ([id number]) -------------------------------------------------------------------------------- Nutrition Risk Assessment Details Patient Name: [name] Date of Service: 06/16/2016 8:45 AM Medical Record Number: [id number] Patient Account Number: [id number] Date of Birth/Sex: 1956/04/09 (60 y.o. Male) Treating RN: Cornell Barman Primary Care Physician: [name] Other Clinician: Referring Physician: [name] Treating Physician/Extender: Frann Rider in Treatment: 0 Height (in): 68 Weight (lbs): 202 Body Mass Index (BMI): 30.7 Nutrition Risk Assessment Items NUTRITION RISK SCREEN: I have an illness or condition that made me change the kind and/or 0 No amount of food I eat I eat fewer than two meals per day 0 No I eat few fruits and vegetables, or milk products 0 No I have three or more drinks of beer, liquor or wine almost every day 0 No I have tooth or mouth problems that make it hard for me to eat 0 No I don't always have enough money to buy the food I need 0 No I eat alone most of the time 0 No I take three or more different prescribed or over-the-counter drugs a 1 Yes day Without wanting to, I have lost or gained 10 pounds in the last six 0 No months I am not always physically able to shop, cook and/or feed myself 0 No Nutrition Protocols Good Risk Protocol 0 No interventions needed Moderate Risk Protocol Electronic Signature(s) Signed: 06/16/2016 1:18:17 PM By: Gretta Cool, RN, BSN, Kim RN, BSN Entered By: Gretta Cool, RN, BSN, Kim on 06/16/2016 09:11:33

## 2016-06-17 NOTE — Telephone Encounter (Signed)
I contacted the patient and advised we have faxed the paper work on 06/13/2016 and was confirmed at 230 pm on that day. Patient had no further questions at this time.

## 2016-06-17 NOTE — Telephone Encounter (Signed)
Patient is calling on the status of a form to be filled out. Please fax.

## 2016-06-23 ENCOUNTER — Encounter: Payer: Medicare Other | Admitting: Surgery

## 2016-06-23 DIAGNOSIS — S81811A Laceration without foreign body, right lower leg, initial encounter: Secondary | ICD-10-CM | POA: Diagnosis not present

## 2016-06-23 DIAGNOSIS — E1140 Type 2 diabetes mellitus with diabetic neuropathy, unspecified: Secondary | ICD-10-CM | POA: Diagnosis not present

## 2016-06-23 DIAGNOSIS — Z87891 Personal history of nicotine dependence: Secondary | ICD-10-CM | POA: Diagnosis not present

## 2016-06-23 DIAGNOSIS — Z794 Long term (current) use of insulin: Secondary | ICD-10-CM | POA: Diagnosis not present

## 2016-06-23 DIAGNOSIS — E11622 Type 2 diabetes mellitus with other skin ulcer: Secondary | ICD-10-CM | POA: Diagnosis not present

## 2016-06-23 DIAGNOSIS — L97821 Non-pressure chronic ulcer of other part of left lower leg limited to breakdown of skin: Secondary | ICD-10-CM | POA: Diagnosis not present

## 2016-06-23 DIAGNOSIS — E11621 Type 2 diabetes mellitus with foot ulcer: Secondary | ICD-10-CM | POA: Diagnosis not present

## 2016-06-23 DIAGNOSIS — Z8551 Personal history of malignant neoplasm of bladder: Secondary | ICD-10-CM | POA: Diagnosis not present

## 2016-06-23 DIAGNOSIS — M7989 Other specified soft tissue disorders: Secondary | ICD-10-CM | POA: Diagnosis not present

## 2016-06-23 DIAGNOSIS — S81812A Laceration without foreign body, left lower leg, initial encounter: Secondary | ICD-10-CM | POA: Diagnosis not present

## 2016-06-23 DIAGNOSIS — Z85828 Personal history of other malignant neoplasm of skin: Secondary | ICD-10-CM | POA: Diagnosis not present

## 2016-06-23 DIAGNOSIS — I10 Essential (primary) hypertension: Secondary | ICD-10-CM | POA: Diagnosis not present

## 2016-06-23 DIAGNOSIS — L97812 Non-pressure chronic ulcer of other part of right lower leg with fat layer exposed: Secondary | ICD-10-CM | POA: Diagnosis not present

## 2016-06-23 NOTE — Progress Notes (Signed)
JANSEN, CLABORN ([id number]) Visit Report for 06/23/2016 Arrival Information Details Patient Name: [name], MONEGRO Date of Service: 06/23/2016 10:00 AM Medical Record Number: [id number] Patient Account Number: [id number] Date of Birth/Sex: March 25, 1956 (60 y.o. Male) Treating RN: Cornell Barman Primary Care Physician: [name] Other Clinician: Referring Physician: [name] Treating Physician/Extender: Frann Rider in Treatment: 1 Visit Information History Since Last Visit Added or deleted any medications: No Patient Arrived: Ambulatory Any new allergies or adverse reactions: No Arrival Time: 10:16 Had a fall or experienced change in No Accompanied By: wife activities of daily living that may affect Transfer Assistance: Manual risk of falls: Patient Identification Verified: Yes Signs or symptoms of abuse/neglect since last No Secondary Verification Process Yes visito Completed: Hospitalized since last visit: No Patient Has Alerts: Yes Has Dressing in Place as Prescribed: Yes Patient Alerts: Patient on Blood Pain Present Now: No Thinner DM Type II Electronic Signature(s) Signed: 06/23/2016 2:18:02 PM By: Gretta Cool, RN, BSN, Kim RN, BSN Entered By: Gretta Cool, RN, BSN, Kim on 06/23/2016 10:19:50 [name] ([id number]) -------------------------------------------------------------------------------- Encounter Discharge Information Details Patient Name: [name] Date of Service: 06/23/2016 10:00 AM Medical Record Number: [id number] Patient Account Number: [id number] Date of Birth/Sex: 03-02-1956 (60 y.o. Male) Treating RN: Cornell Barman Primary Care Physician: [name] Other Clinician: Referring Physician: [name] Treating Physician/Extender: Frann Rider in Treatment: 1 Encounter Discharge Information Items Discharge Pain Level: 0 Discharge Condition: Stable Ambulatory Status: Ambulatory Discharge Destination: Home Transportation: Private  Auto Accompanied By: wife Schedule Follow-up Appointment: Yes Medication Reconciliation completed and provided to Patient/Care Yes Bradie Sangiovanni: Provided on Clinical Summary of Care: 06/23/2016 Form Type Recipient Paper Patient TW Electronic Signature(s) Signed: 06/23/2016 10:53:33 AM By: Ruthine Dose Entered By: Ruthine Dose on 06/23/2016 10:53:33 Levario, Mora Appl ([id number]) -------------------------------------------------------------------------------- Lower Extremity Assessment Details Patient Name: [name] Date of Service: 06/23/2016 10:00 AM Medical Record Number: [id number] Patient Account Number: [id number] Date of Birth/Sex: 22-Dec-1955 (60 y.o. Male) Treating RN: Cornell Barman Primary Care Physician: [name] Other Clinician: Referring Physician: [name] Treating Physician/Extender: Frann Rider in Treatment: 1 Edema Assessment Assessed: [Left: No] [Right: No] E[Left: dema] [Right: :] Calf Left: Right: Point of Measurement: 34 cm From Medial Instep 36.8 cm 37.5 cm Ankle Left: Right: Point of Measurement: 12 cm From Medial Instep 23 cm 23.2 cm Vascular Assessment Pulses: Posterior Tibial Dorsalis Pedis Palpable: [Left:Yes] [Right:Yes] Extremity colors, hair growth, and conditions: Extremity Color: [Left:Normal] [Right:Normal] Hair Growth on Extremity: [Left:Yes] [Right:Yes] Temperature of Extremity: [Left:Warm] [Right:Warm] Capillary Refill: [Left:< 3 seconds] [Right:< 3 seconds] Dependent Rubor: [Left:No] [Right:No] Blanched when Elevated: [Left:No] [Right:No] Lipodermatosclerosis: [Left:No] [Right:No] Toe Nail Assessment Left: Right: Thick: No No Discolored: No No Deformed: No No Improper Length and Hygiene: No No Electronic Signature(s) Signed: 06/23/2016 2:18:02 PM By: Gretta Cool, RN, BSN, Kim RN, BSN [address] [place] ([id number]) Entered By: Gretta Cool, RN, BSN, Kim on 06/23/2016 10:23:44 [name]  ([id number]) -------------------------------------------------------------------------------- Multi Wound Chart Details Patient Name: [name] Date of Service: 06/23/2016 10:00 AM Medical Record Number: [id number] Patient Account Number: [id number] Date of Birth/Sex: [birth year] (60 y.o. 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 Lower Extremity Secondary Etiology: Trauma, Other Trauma, Other N/A Comorbid History: Cataracts, Deep Vein Cataracts, Deep Vein N/A Thrombosis, Thrombosis, Hypertension, Type II Hypertension, Type II Diabetes, Neuropathy Diabetes, Neuropathy Date Acquired: 06/05/2016 06/05/2016 N/A Weeks of Treatment: 1 1 N/A Wound Status: Open Open N/A Measurements L x W x D 13x1x0.2 8.5x0.3x0.1 N/A (cm) Area (cm) : 10.21 2.003 N/A Volume (cm) : 2.042 0.2 N/A % Reduction in Area: 0.00% 43.30% N/A % Reduction in Volume: -100.00% 43.30% N/A Classification: Grade 1 Grade 1 N/A Exudate Amount: Medium None Present N/A Exudate Type: Sanguinous N/A N/A Exudate Color: red N/A N/A Wound Margin: Flat and Intact Flat and Intact N/A Granulation Amount: None Present (0%) Medium (34-66%) N/A ROBERTJAMES, EISEL. ([id number]) Necrotic Amount: Large (67-100%) Medium (34-66%) N/A Necrotic Tissue: Eschar, Adherent Slough Eschar N/A Exposed Structures: Fascia: No Fascia: No N/A Fat: No Fat: No Tendon: No Tendon: No Muscle: No Muscle: No Joint: No Joint:  No Bone: No Bone: No Limited to Skin Limited to Skin Breakdown Breakdown Epithelialization: None Medium (34-66%) N/A Periwound Skin Texture: Edema: Yes Scarring: Yes N/A Induration: Yes Edema: No Scarring: Yes Excoriation: No Excoriation: No Induration: No Callus: No Callus: No Crepitus: No Crepitus: No Fluctuance: No Fluctuance: No Friable: No Friable: No Rash: No Rash: No Periwound Skin Maceration: No Moist: Yes N/A Moisture: Moist: No Maceration: No Dry/Scaly: No Dry/Scaly: No Periwound Skin Color: Atrophie Blanche: No Ecchymosis: Yes N/A Cyanosis: No Atrophie Blanche: No Ecchymosis: No Cyanosis: No Erythema: No Erythema: No Hemosiderin Staining: No Hemosiderin Staining: No Mottled: No Mottled: No Pallor: No Pallor: No Rubor: No Rubor: No Temperature: No Abnormality No Abnormality N/A Tenderness on Yes Yes N/A Palpation: Wound Preparation: Ulcer Cleansing: Ulcer Cleansing: N/A Rinsed/Irrigated with Rinsed/Irrigated with Saline Saline Topical Anesthetic Topical Anesthetic Applied: Other: lidocaine Applied: Other: lidocane 4% 4% Assessment Notes: 11 staples remaining in N/A N/A wound. Treatment Notes Electronic Signature(s) Signed: 06/23/2016 2:18:02 PM By: Gretta Cool, RN, BSN, Kim RN, BSN Entered By: Gretta Cool, RN, BSN, Kim on 06/23/2016 10:39:20 Miller, Mora Appl ([id number]) CARDER, BELOW ([id number]) -------------------------------------------------------------------------------- [place] Details Patient Name: DYONTE, COSGROVE Date of Service: 06/23/2016 10:00 AM Medical Record Number: [id number] Patient Account Number: [id number] Date of Birth/Sex: 22-Jan-1956 (60 y.o. Male) Treating RN: Cornell Barman Primary Care Physician: [name] Other Clinician: Referring Physician: [name] Treating Physician/Extender: Frann Rider in Treatment: 1 Active Inactive Necrotic Tissue Nursing Diagnoses: Impaired tissue integrity  related to necrotic/devitalized tissue Knowledge deficit related to management of necrotic/devitalized tissue Goals: Necrotic/devitalized tissue will be minimized in the wound bed Date Initiated: 06/23/2016 Goal Status: Active Patient/caregiver will verbalize understanding of reason and process for debridement of necrotic tissue Date Initiated: 06/23/2016 Goal Status: Active Interventions: Assess patient pain level pre-, during and post procedure and prior to discharge Provide education on necrotic tissue and debridement process Treatment Activities: Apply topical anesthetic as ordered : 06/23/2016 Notes: Nutrition Nursing Diagnoses: Impaired glucose control: actual or potential Goals: Patient/caregiver agrees to and verbalizes understanding of need to obtain nutritional consultation Date Initiated: 06/16/2016 Goal Status: Active Interventions: Assess HgA1c results as ordered upon admission and as needed Notes: BURWELL, HOAGE ([id number]) Orientation to the Wound Care Program Nursing Diagnoses: Knowledge deficit related to the wound healing center program Goals: Patient/caregiver will verbalize understanding of the [place] Program Date Initiated: 06/16/2016 Goal Status: Active Interventions: Provide education on orientation to the wound center Notes: Wound/Skin Impairment Nursing Diagnoses: Impaired  tissue integrity Goals: Ulcer/skin breakdown will heal within 14 weeks Date Initiated: 06/16/2016 Goal Status: Active Interventions: Assess patient/caregiver ability to obtain necessary supplies Treatment Activities: Refer to smoking cessation program : 06/16/2016 Notes: Electronic Signature(s) Signed: 06/23/2016 2:18:02 PM By: Gretta Cool, RN, BSN, Kim RN, BSN Entered By: Gretta Cool, RN, BSN, Kim on 06/23/2016 10:39:03 Schwebach, Mora Appl ([id number]) -------------------------------------------------------------------------------- Pain Assessment Details Patient Name: [name] Date of Service: 06/23/2016 10:00 AM Medical Record Number: [id number] Patient Account Number: [id number] Date of Birth/Sex: 1955/09/19 (60 y.o. Male) Treating RN: Cornell Barman Primary Care Physician: [name] Other Clinician: Referring Physician: [name] Treating Physician/Extender: Frann Rider in Treatment: 1 Active Problems Location of Pain Severity and Description of Pain Patient Has Paino No Site Locations With Dressing Change: No Pain Management and Medication Current Pain Management: Electronic Signature(s) Signed: 06/23/2016 2:18:02 PM By: Gretta Cool, RN, BSN, Kim RN, BSN Entered By: Gretta Cool, RN, BSN, Kim on 06/23/2016 10:19:58 [name] ([id number]) -------------------------------------------------------------------------------- Patient/Caregiver Education Details Patient Name: [name] Date of Service: 06/23/2016 10:00 AM Medical Record Number: [id number] Patient Account Number: [id number] Date of Birth/Gender: 1955/12/01 (60 y.o. Male) Treating RN: Cornell Barman Primary Care Physician: [name] Other Clinician: Referring Physician: [name] Treating Physician/Extender: Frann Rider in Treatment: 1 Education Assessment Education Provided To: Patient Education Topics Provided Wound Debridement: Handouts: Wound Debridement Methods: Demonstration Responses: State content correctly Electronic Signature(s) Signed: 06/23/2016 2:18:02 PM By: Gretta Cool, RN, BSN, Kim RN, BSN Entered By: Gretta Cool, RN, BSN, Kim on 06/23/2016 10:47:05 Mcniel, Mora Appl ([id number]) -------------------------------------------------------------------------------- Wound Assessment Details Patient Name: [name] Date of Service: 06/23/2016 10:00 AM Medical Record Number: [id number] Patient Account Number: [id number] Date of Birth/Sex: 25-Nov-1955 (60 y.o. Male) Treating RN: Cornell Barman Primary Care Physician: [name] Other Clinician: Referring  Physician: [name] Treating Physician/Extender: Frann Rider in Treatment: 1 Wound Status Wound Number: 1 Primary Diabetic Wound/Ulcer of the Lower Etiology: Extremity Wound Location: Right Lower Leg Secondary Trauma, Other Wounding Event: Trauma Etiology: Date Acquired: 06/05/2016 Wound Open Weeks Of Treatment: 1 Status: Clustered Wound: No Comorbid Cataracts, Deep Vein Thrombosis, History: Hypertension, Type II Diabetes, Neuropathy Photos Wound Measurements Length: (cm) 13 Width: (cm) 1 Depth: (cm) 0.2 Area: (cm) 10.21 Volume: (cm) 2.042 % Reduction in Area: 0% % Reduction in Volume: -100% Epithelialization: None Tunneling: No Undermining: No Wound Description Classification: Grade 1 Wound Margin: Flat and Intact Exudate Amount: Medium Exudate Type: Sanguinous Exudate Color: red Foul Odor After Cleansing: No Wound Bed Granulation Amount: None Present (0%) Exposed Structure Necrotic Amount: Large (67-100%) Fascia Exposed: No Necrotic Quality: Eschar, Adherent Slough Fat Layer Exposed: No Tendon Exposed: No Muscle Exposed: No WARNER, BARLING ([id number]) Joint Exposed: No Bone Exposed: No Limited to Skin Breakdown Periwound Skin Texture Texture Color No Abnormalities Noted: No No Abnormalities Noted: No Callus: No Atrophie Blanche: No Crepitus: No Cyanosis: No Excoriation: No Ecchymosis: No Fluctuance: No Erythema: No Friable: No Hemosiderin Staining: No Induration: Yes Mottled: No Localized Edema: Yes Pallor: No Rash: No Rubor: No Scarring: Yes Temperature / Pain Moisture Temperature: No Abnormality No Abnormalities Noted: No Tenderness on Palpation: Yes Dry / Scaly: No Maceration: No Moist: No Wound Preparation Ulcer Cleansing: Rinsed/Irrigated with Saline Topical Anesthetic Applied: Other: lidocaine 4%, Assessment Notes 11 staples remaining in wound. Treatment Notes Wound #1 (Right Lower Leg) 1. Cleansed  with: Clean wound with Normal Saline 2. Anesthetic Topical Lidocaine 4% cream to wound bed prior to debridement 4. Dressing Applied: Other dressing (specify in  notes) 5. Secondary Dressing Applied Non-Adherent pad Notes Medihoney secured with gauze and stretchnet Electronic Signature(s) Signed: 06/23/2016 2:18:02 PM By: Gretta Cool, RN, BSN, Kim RN, BSN Entered By: Gretta Cool, RN, BSN, Kim on 06/23/2016 10:29:01 Chiusano, Mora Appl ([id number]) -------------------------------------------------------------------------------- Wound Assessment Details Patient Name: [name] Date of Service: 06/23/2016 10:00 AM Medical Record Number: [id number] Patient Account Number: [id number] Date of Birth/Sex: 1956-03-14 (60 y.o. Male) Treating RN: Cornell Barman Primary Care Physician: [name] Other Clinician: Referring Physician: [name] Treating Physician/Extender: Frann Rider in Treatment: 1 Wound Status Wound Number: 2 Primary Diabetic Wound/Ulcer of the Lower Etiology: Extremity Wound Location: Left Lower Leg - Medial Secondary Trauma, Other Wounding Event: Trauma Etiology: Date Acquired: 06/05/2016 Wound Open Weeks Of Treatment: 1 Status: Clustered Wound: No Comorbid Cataracts, Deep Vein Thrombosis, History: Hypertension, Type II Diabetes, Neuropathy Photos Wound Measurements Length: (cm) 8.5 Width: (cm) 0.3 Depth: (cm) 0.1 Area: (cm) 2.003 Volume: (cm) 0.2 % Reduction in Area: 43.3% % Reduction in Volume: 43.3% Epithelialization: Medium (34-66%) Tunneling: No Undermining: No Wound Description Classification: Grade 1 Wound Margin: Flat and Intact Exudate Amount: None Present Foul Odor After Cleansing: No Wound Bed Granulation Amount: Medium (34-66%) Exposed Structure Necrotic Amount: Medium (34-66%) Fascia Exposed: No Necrotic Quality: Eschar Fat Layer Exposed: No Tendon Exposed: No Muscle Exposed: No Joint Exposed: No Bone Exposed: No WARSAME, HENDERSHOT. ([id number]) Limited to Skin Breakdown Periwound Skin Texture Texture Color No Abnormalities Noted: No No Abnormalities Noted: No Callus: No Atrophie Blanche: No Crepitus: No Cyanosis: No Excoriation: No Ecchymosis: Yes Fluctuance: No Erythema: No Friable: No Hemosiderin Staining: No Induration: No Mottled: No Localized Edema: No Pallor: No Rash: No Rubor: No Scarring: Yes Temperature / Pain Moisture Temperature: No Abnormality No Abnormalities Noted: No Tenderness on Palpation: Yes Dry / Scaly: No Maceration: No Moist: Yes Wound Preparation Ulcer Cleansing: Rinsed/Irrigated with Saline Topical Anesthetic Applied: Other: lidocane 4%, Treatment Notes Wound #2 (Left, Medial Lower Leg) 1. Cleansed with: Clean wound with Normal Saline 2. Anesthetic Topical Lidocaine 4% cream to wound bed prior to debridement 4. Dressing Applied: Other dressing (specify in notes) 5. Secondary Dressing Applied Non-Adherent pad Notes Medihoney secured with gauze and stretchnet Electronic Signature(s) Signed: 06/23/2016 2:18:02 PM By: Gretta Cool, RN, BSN, Kim RN, BSN Entered By: Gretta Cool, RN, BSN, Kim on 06/23/2016 10:40:06 Fry, Mora Appl ([id number]) -------------------------------------------------------------------------------- [place] Details Patient Name: [name] Date of Service: 06/23/2016 10:00 AM Medical Record Number: [id number] Patient Account Number: [id number] Date of Birth/Sex: Feb 17, 1956 (60 y.o. Male) Treating RN: Cornell Barman Primary Care Physician: [name] Other Clinician: Referring Physician: [name] Treating Physician/Extender: Frann Rider in Treatment: 1 Vital Signs Time Taken: 10:20 Temperature (F): 97.8 Height (in): 68 Pulse (bpm): 71 Weight (lbs): 202 Respiratory Rate (breaths/min): 16 Body Mass Index (BMI): 30.7 Blood Pressure (mmHg): 148/75 Reference Range: 80 - 120 mg / dl Electronic Signature(s) Signed: 06/23/2016 2:18:02 PM  By: Gretta Cool, RN, BSN, Kim RN, BSN Entered By: Gretta Cool, RN, BSN, Kim on 06/23/2016 10:20:17

## 2016-06-23 NOTE — Progress Notes (Signed)
CREEK, SWADER ([id number]) Visit Report for 06/23/2016 Chief Complaint Document Details Patient Name: [name] Date of Service: 06/23/2016 10:00 AM Medical Record Number: [id number] Patient Account Number: [id number] Date of Birth/Sex: 05-02-1956 (60 y.o. Male) Treating RN: Cornell Barman Primary Care Physician: [name] Other Clinician: Referring Physician: [name] Treating Physician/Extender: Frann Rider in Treatment: 1 Information Obtained from: Patient Chief Complaint Patients presents for treatment of an open diabetic ulcer and a recent laceration both lower extremities which happened on 06/05/2016 Electronic Signature(s) Signed: 06/23/2016 10:52:43 AM By: Christin Fudge MD, FACS Entered By: Christin Fudge on 06/23/2016 10:52:42 [name] ([id number]) -------------------------------------------------------------------------------- Debridement Details Patient Name: [name] Date of Service: 06/23/2016 10:00 AM Medical Record Number: [id number] Patient Account Number: [id number] Date of Birth/Sex: 05/20/1956 (60 y.o. Male) Treating RN: Cornell Barman Primary Care Physician: [name] Other Clinician: Referring Physician: [name] Treating Physician/Extender: Frann Rider in Treatment: 1 Debridement Performed for Wound #1 Right Lower Leg Assessment: Performed By: Physician Christin Fudge, MD Debridement: Debridement Pre-procedure Yes - 10:35 Verification/Time Out Taken: Start Time: 10:36 Pain Control: Other : lidocaine 4% Level: Skin/Subcutaneous Tissue Total Area Debrided (L x 13 (cm) x 1 (cm) = 13 (cm) W): Tissue and other Non-Viable, Eschar, Exudate, Fibrin/Slough, Subcutaneous material debrided: Instrument: Curette Bleeding: Minimum Hemostasis Achieved: Pressure End Time: 10:41 Procedural Pain: 0 Post Procedural Pain: 0 Response to Treatment: Procedure was tolerated well Post Debridement Measurements of Total  Wound Length: (cm) 13 Width: (cm) 1 Depth: (cm) 0.2 Volume: (cm) 2.042 Character of Wound/Ulcer Post Stable Debridement: Severity of Tissue Post Debridement: Fat layer exposed Post Procedure Diagnosis Same as Pre-procedure Notes some of the skin at the edges has necrotic debris and there is some gaping between the staple line and this was sharply debrided with a #3 curet and irrigated with saline. Electronic Signature(s) Signed: 06/23/2016 10:49:55 AM By: Christin Fudge MD, FACS [name] ([id number]) Signed: 06/23/2016 2:18:02 PM By: Gretta Cool RN, BSN, Kim RN, BSN Entered By: Christin Fudge on 06/23/2016 10:49:55 DELMOS, ZAHORIK ([id number]) -------------------------------------------------------------------------------- HPI Details Patient Name: [name] Date of Service: 06/23/2016 10:00 AM Medical Record Number: [id number] Patient Account Number: [id number] Date of Birth/Sex: 10/14/1955 (60 y.o. Male) Treating RN: Cornell Barman Primary Care Physician: [name] Other Clinician: Referring Physician: [name] Treating Physician/Extender: Frann Rider in Treatment: 1 History of Present Illness Location: bilateral lower extremity lacerations with staples intact on the right lower extremity Quality: Patient reports experiencing a dull pain to affected area(s). Severity: Patient states wound (s) are getting better. Duration: Patient has had the wound for < 2 weeks prior to presenting for treatment Timing: Pain in wound is Intermittent (comes and goes Context: The wound occurred when the patient had lacerations with a metal sheet which had to be repaired in the ER Modifying Factors: Other treatment(s) tried include:as noted he has staples on the right lower extremity and has been on 2 courses of antibiotics Associated Signs and Symptoms: Patient reports having increase swelling. HPI Description: 60 year old gentleman who is a patient of Dr. Nash Shearer and  was seen by him recently on November 1 for a history of having scraped both legs with a piece of metal and was seen in the ER and the right leg was closed with staples. He was placed on Keflex and. Dr. Loanne Drilling treated his diabetes appropriately and also prescribed doxycycline to be taken. 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 he is status post axillary hidradenitis excision, cataract extraction,laser ablation of condyloma, hemorrhoid surgery, incision and drainage of abscess, transurethral resection of bladder tumor. he smokes a pack of cigarettes every day. the patient has been seen by Dr. Fletcher Anon in the past and he has had a workup done which was remote in 2015 which showed the right side showed a ABI of 0.44 and the left was normal. 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 No intervention was done and he was put on Cilostazol. Not find any electronic records of recent tests done since August 2015. 06/23/2016 -- the patient continues to smoke half a pack of cigarettes a day and is working on giving up completely [employer]) Signed: 06/23/2016 10:53:03 AM By: Christin Fudge MD, FACS Entered By: Christin Fudge on 06/23/2016 10:53:03 [name] ([id number]) -------------------------------------------------------------------------------- Physical Exam Details Patient Name: [name] Date of Service: 06/23/2016 10:00 AM Medical Record Number: [id number] Patient Account Number: [id number] Date of Birth/Sex: 1956/02/10 (60 y.o. Male) Treating RN: Cornell Barman Primary Care Physician: [name] Other Clinician: Referring Physician:  [name] Treating Physician/Extender: Frann Rider in Treatment: 1 Constitutional . Pulse regular. Respirations normal and unlabored. Afebrile. . Eyes Nonicteric. Reactive to light. Ears, Nose, Mouth, and Throat Lips, teeth, and gums WNL.Marland Kitchen Moist mucosa without lesions. Neck supple and nontender. No palpable supraclavicular or cervical adenopathy. Normal sized without goiter. Respiratory WNL. No retractions.. Breath sounds WNL, No rubs, rales, rhonchi, or wheeze.. Cardiovascular Heart rhythm and rate regular, no murmur or gallop.. Pedal Pulses WNL. No clubbing, cyanosis or edema. Lymphatic No adneopathy. No adenopathy. No adenopathy. Musculoskeletal Adexa without tenderness or enlargement.. Digits and nails w/o clubbing, cyanosis, infection, petechiae, ischemia, or inflammatory conditions.. Integumentary (Hair, Skin) No suspicious lesions. No crepitus or fluctuance. No peri-wound warmth or erythema. No masses.Marland Kitchen Psychiatric Judgement and insight Intact.. No evidence of depression, anxiety, or agitation.. Notes left leg is looking fairly clean and no debridement was required. The right lower extremity has areas of gaping between the staple line and there is some necrotic debris which was sharply removed with a #3 curet and there was no bleeding Electronic Signature(s) Signed: 06/23/2016 10:53:39 AM By: Christin Fudge MD, FACS Entered By: Christin Fudge on 06/23/2016 10:53:38 [name] ([id number]) -------------------------------------------------------------------------------- Physician Orders Details Patient Name: [name] Date of Service: 06/23/2016 10:00 AM Medical Record Number: [id number] Patient Account Number: [id number] Date of Birth/Sex: 1956/04/02 (60 y.o. Male) Treating RN: Cornell Barman Primary Care Physician: [name] Other Clinician: Referring Physician: [name] Treating Physician/Extender: Frann Rider in Treatment: 1 Verbal /  Phone Orders: Yes Clinician: Cornell Barman Read Back and Verified: Yes Diagnosis Coding Wound Cleansing Wound #1 Right Lower Leg o Clean wound with Normal Saline. o May Shower, gently pat wound dry prior to applying new dressing. Wound #2 Left,Medial Lower Leg o Clean wound with Normal Saline. o May Shower, gently pat wound dry prior to applying new dressing. Anesthetic Wound #1 Right Lower Leg o Topical Lidocaine 4% cream applied to wound bed prior to debridement - in office only Wound #2 Left,Medial Lower Leg o Topical Lidocaine 4% cream applied to wound bed prior to debridement - in office only Primary Wound Dressing Wound #1 Right Lower Leg o Medihoney  gel Wound #2 Left,Medial Lower Leg o Medihoney gel Secondary Dressing Wound #1 Right Lower Leg o Conform/Kerlix o Non-adherent pad Wound #2 Left,Medial Lower Leg o Conform/Kerlix o Non-adherent pad Dressing Change Frequency Wound #1 Right Lower Leg o Change dressing every day. REVELL, LUNN ([id number]) Wound #2 Left,Medial Lower Leg o Change dressing every day. Follow-up Appointments Wound #1 Right Lower Leg o Return Appointment in 1 week. Wound #2 Left,Medial Lower Leg o Return Appointment in 1 week. Edema Control Wound #1 Right Lower Leg o Elevate legs to the level of the heart and pump ankles as often as possible Wound #2 Left,Medial Lower Leg o Elevate legs to the level of the heart and pump ankles as often as possible Additional Orders / Instructions Wound #1 Right Lower Leg o Stop Smoking o Activity as tolerated Wound #2 Left,Medial Lower Leg o Stop Smoking o Activity as tolerated Medications-please add to medication list. Wound #1 Right Lower Leg o Other: - Medihoney Wound #2 Left,Medial Lower Leg o Other: - Medihoney Patient Medications Allergies: No Known Drug Allergies Notifications Medication Indication Start End MediHoney (honey) 06/23/2016 DOSE  topical 100 % paste - paste topical as directed Electronic Signature(s) Signed: 06/23/2016 10:51:27 AM By: Christin Fudge MD, FACS Entered By: Christin Fudge on 06/23/2016 10:51:26 Brundidge, Mora Appl ([id number] Heimlich, Mora Appl ([id number]) -------------------------------------------------------------------------------- Problem List Details Patient Name: [name] Date of Service: 06/23/2016 10:00 AM Medical Record Number: [id number] Patient Account Number: [id number] Date of Birth/Sex: [birth year] (60 y.o. Male) Treating RN: Cornell Barman Primary Care Physician: [name] Other Clinician: Referring Physician: [name] Treating Physician/Extender: Frann Rider in Treatment: 1 Active Problems ICD-10 Encounter Code Description Active Date Diagnosis E11.621 Type 2 diabetes mellitus with foot ulcer 06/16/2016 Yes S81.811A Laceration without foreign body, right lower leg, initial 06/16/2016 Yes encounter [id number] Laceration without foreign body, left lower leg, initial 06/16/2016 Yes encounter F17.218 Nicotine dependence, cigarettes, with other nicotine- 06/16/2016 Yes induced disorders Inactive Problems Resolved Problems Electronic Signature(s) Signed: 06/23/2016 10:51:47 AM By: Christin Fudge MD, FACS Previous Signature: 06/23/2016 10:49:27 AM Version By: Christin Fudge MD, FACS Entered By: Christin Fudge on 06/23/2016 10:51:47 [name] ([id number]) -------------------------------------------------------------------------------- Progress Note Details Patient Name: [name] Date of Service: 06/23/2016 10:00 AM Medical Record Number: [id number] Patient Account Number: [id number] Date of Birth/Sex: 1955/11/01 (60 y.o. Male) Treating RN: Cornell Barman Primary Care Physician: [name] Other Clinician: Referring Physician: [name] Treating Physician/Extender: Frann Rider in Treatment: 1 Subjective Chief Complaint Information obtained from  Patient Patients presents for treatment of an open diabetic ulcer and a recent laceration both lower extremities which happened on 06/05/2016 History of Present Illness (HPI) The following HPI elements were documented for the patient's wound: Location: bilateral lower extremity lacerations with staples intact on the right lower extremity Quality: Patient reports experiencing a dull pain to affected area(s). Severity: Patient states wound (s) are getting better. Duration: Patient has had the wound for < 2 weeks prior to presenting for treatment Timing: Pain in wound is Intermittent (comes and goes Context: The wound occurred when the patient had lacerations with a metal sheet which had to be repaired in the ER Modifying Factors: Other treatment(s) tried include:as noted he has staples on the right lower extremity and has been on 2 courses of antibiotics Associated Signs and Symptoms: Patient reports having increase swelling. 60 year old gentleman who is a patient of Dr. Nash Shearer and was seen by him recently on November 1 for a history of  having scraped both legs with a piece of metal and was seen in the ER and the right leg was closed with staples. He was placed on Keflex and. Dr. Loanne Drilling treated his diabetes appropriately and also prescribed doxycycline to be taken. 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 he is status post axillary hidradenitis excision, cataract extraction,laser ablation of condyloma, hemorrhoid surgery, incision and drainage of abscess, transurethral resection of bladder tumor. he smokes a pack of cigarettes every day. the patient has been seen by Dr. Fletcher Anon in the past and he has had a workup done which was remote in 2015 which showed the right side showed a  ABI of 0.44 and the left was normal. 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 No intervention was done and he was put on Cilostazol. Not find any electronic records of recent tests done since August 2015. 06/23/2016 -- the patient continues to smoke half a pack of cigarettes a day and is working on giving up completely [name], [name]. ([id number]) Objective Constitutional Pulse regular. Respirations normal and unlabored. Afebrile. Vitals Time Taken: 10:20 AM, Height: 68 in, Weight: 202 lbs, BMI: 30.7, Temperature: 97.8 F, Pulse: 71 bpm, Respiratory Rate: 16 breaths/min, Blood Pressure: 148/75 mmHg. Eyes Nonicteric. Reactive to light. Ears, Nose, Mouth, and Throat Lips, teeth, and gums WNL.Marland Kitchen Moist mucosa without lesions. Neck supple and nontender. No palpable supraclavicular or cervical adenopathy. Normal sized without goiter. Respiratory WNL. No retractions.. Breath sounds WNL, No rubs, rales, rhonchi, or wheeze.. Cardiovascular Heart rhythm and rate regular, no murmur or gallop.. Pedal Pulses WNL. No clubbing, cyanosis or edema. Lymphatic No adneopathy. No adenopathy. No adenopathy. Musculoskeletal Adexa without tenderness or enlargement.. Digits and nails w/o clubbing, cyanosis, infection, petechiae, ischemia, or inflammatory conditions.Marland Kitchen Psychiatric Judgement and insight Intact.. No evidence of depression, anxiety, or agitation.. General Notes: left leg is looking fairly clean and no debridement was required. The right lower extremity has areas of gaping between the staple line and there is some necrotic debris which was sharply removed with a #3 curet and there was no bleeding Integumentary (Hair, Skin) No suspicious lesions. No crepitus or fluctuance. No peri-wound warmth or erythema. No masses.. Wound #1 status is Open. Original cause of wound  was Trauma. The wound is located on the Right Lower Leg. The wound measures 13cm length x 1cm width x 0.2cm depth; 10.21cm^2 area and 2.042cm^3 volume. The wound is limited to skin breakdown. There is no tunneling or undermining noted. There is a medium amount of sanguinous drainage noted. The wound margin is flat and intact. There is no granulation within the wound bed. There is a large (67-100%) amount of necrotic tissue within the wound bed including ADHRITH, WIERMAN. ([id number]) [place]. The periwound skin appearance exhibited: Induration, Localized Edema, Scarring. The periwound skin appearance did not exhibit: Callus, Crepitus, Excoriation, Fluctuance, Friable, Rash, Dry/Scaly, Maceration, Moist, Atrophie Blanche, Cyanosis, Ecchymosis, Hemosiderin Staining, Mottled, Pallor, Rubor, Erythema. Periwound temperature was noted as No Abnormality. The periwound has tenderness on palpation. General Notes: 11 staples remaining in wound. Wound #2 status is Open. Original cause of wound was Trauma. The wound is located on the Left,Medial Lower Leg. The wound measures 8.5cm length x 0.3cm width  x 0.1cm depth; 2.003cm^2 area and 0.2cm^3 volume. The wound is limited to skin breakdown. There is no tunneling or undermining noted. There is a none present amount of drainage noted. The wound margin is flat and intact. There is medium (34-66%) granulation within the wound bed. There is a medium (34-66%) amount of necrotic tissue within the wound bed including Eschar. The periwound skin appearance exhibited: Scarring, Moist, Ecchymosis. The periwound skin appearance did not exhibit: Callus, Crepitus, Excoriation, Fluctuance, Friable, Induration, Localized Edema, Rash, Dry/Scaly, Maceration, Atrophie Blanche, Cyanosis, Hemosiderin Staining, Mottled, Pallor, Rubor, Erythema. Periwound temperature was noted as No Abnormality. The periwound has tenderness on palpation. Assessment Active  Problems ICD-10 E11.621 - Type 2 diabetes mellitus with foot ulcer S81.811A - Laceration without foreign body, right lower leg, initial encounter S81.812A - Laceration without foreign body, left lower leg, initial encounter F17.218 - Nicotine dependence, cigarettes, with other nicotine-induced disorders Procedures Wound #1 Wound #1 is a Diabetic Wound/Ulcer of the Lower Extremity located on the Right Lower Leg . There was a Skin/Subcutaneous Tissue Debridement [id number]) debridement with total area of 13 sq cm performed by Christin Fudge, MD. with the following instrument(s): Curette to remove Non-Viable tissue/material including Exudate, Fibrin/Slough, Eschar, and Subcutaneous after achieving pain control using Other (lidocaine 4%). A time out was conducted at 10:35, prior to the start of the procedure. A Minimum amount of bleeding was controlled with Pressure. The procedure was tolerated well with a pain level of 0 throughout and a pain level of 0 following the procedure. Post Debridement Measurements: 13cm length x 1cm width x 0.2cm depth; 2.042cm^3 volume. Character of Wound/Ulcer Post Debridement is stable. Severity of Tissue Post Debridement is: Fat layer exposed. Post procedure Diagnosis Wound #1: Same as Pre-Procedure DEVRY, SEGAR. ([id number]) General Notes: some of the skin at the edges has necrotic debris and there is some gaping between the staple line and this was sharply debrided with a #3 curet and irrigated with saline.. Plan Wound Cleansing: Wound #1 Right Lower Leg: Clean wound with Normal Saline. May Shower, gently pat wound dry prior to applying new dressing. Wound #2 Left,Medial Lower Leg: Clean wound with Normal Saline. May Shower, gently pat wound dry prior to applying new dressing. Anesthetic: Wound #1 Right Lower Leg: Topical Lidocaine 4% cream applied to wound bed prior to debridement - in office only Wound #2 Left,Medial Lower Leg: Topical Lidocaine 4%  cream applied to wound bed prior to debridement - in office only Primary Wound Dressing: Wound #1 Right Lower Leg: Medihoney gel Wound #2 Left,Medial Lower Leg: Medihoney gel Secondary Dressing: Wound #1 Right Lower Leg: Conform/Kerlix Non-adherent pad Wound #2 Left,Medial Lower Leg: Conform/Kerlix Non-adherent pad Dressing Change Frequency: Wound #1 Right Lower Leg: Change dressing every day. Wound #2 Left,Medial Lower Leg: Change dressing every day. Follow-up Appointments: Wound #1 Right Lower Leg: Return Appointment in 1 week. Wound #2 Left,Medial Lower Leg: Return Appointment in 1 week. Edema Control: Wound #1 Right Lower Leg: Elevate legs to the level of the heart and pump ankles as often as possible Wound #2 Left,Medial Lower Leg: Elevate legs to the level of the heart and pump ankles as often as possible Additional Orders / Instructions: Wound #1 Right Lower Leg: EILERT, OCHELTREE ([id number]) Stop Smoking Activity as tolerated Wound #2 Left,Medial Lower Leg: Stop Smoking Activity as tolerated Medications-please add to medication list.: Wound #1 Right Lower Leg: Other: - Medihoney Wound #2 Left,Medial Lower Leg: Other: - Medihoney The following medication(s) was prescribed: MediHoney (honey)  topical 100 % paste paste topical as directed starting 06/23/2016 I have recommended: 1. Medihoney to be applied daily and lightly wrapped with Kerlix 2. he can wash with soap and water 3. I have reviewed the electronic medical records and none of his vascular reports a recent the last one being done in 2015. 4. Asked him to leave his staples intact and we will remove this over 2-3 weeks. 5. Smoking cessation has been discussed with him for 3 minutes and I have discussed the risk benefits alternatives and all the possible benefits from giving up smoking. He is rather reluctant to do this at the present time 6. Regular visits to the wound center Electronic  Signature(s) Signed: 06/23/2016 10:55:39 AM By: Christin Fudge MD, FACS Entered By: Christin Fudge on 06/23/2016 10:55:39 [name] ([id number]) -------------------------------------------------------------------------------- SuperBill Details Patient Name: [name] Date of Service: 06/23/2016 Medical Record Number: [id number] Patient Account Number: [id number] Date of Birth/Sex: [birth year] (60 y.o. Male) Treating RN: Cornell Barman Primary Care Physician: [name] Other Clinician: Referring Physician: [name] Treating Physician/Extender: Frann Rider in Treatment: 1 Diagnosis Coding ICD-10 Codes Code Description E11.621 Type 2 diabetes mellitus with foot ulcer S81.811A Laceration without foreign body, right lower leg, initial encounter S81.812A Laceration without foreign body, left lower leg, initial encounter F17.218 Nicotine dependence, cigarettes, with other nicotine-induced disorders Facility Procedures CPT4 Code Description: [id number] - DEB SUBQ TISSUE 20 SQ CM/< ICD-10 Description Diagnosis E11.621 Type 2 diabetes mellitus with foot ulcer S81.811A Laceration without foreign body, right lower leg, initi F17.218 Nicotine dependence, cigarettes,  with other nicotine-in Modifier: al encounte duced disor Quantity: 1 r ders Physician Procedures CPT4 Code Description: [id number] - WC PHYS SUBQ TISS 20 SQ CM ICD-10 Description Diagnosis E11.621 Type 2 diabetes mellitus with foot ulcer S81.811A Laceration without foreign body, right lower leg, initi F17.218 Nicotine dependence, cigarettes, with  other nicotine-in Modifier: al encounte duced disor Quantity: 1 r ders Electronic Signature(s) Signed: 06/23/2016 10:55:50 AM By: Christin Fudge MD, FACS Entered By: Christin Fudge on 06/23/2016 10:55:49

## 2016-06-30 ENCOUNTER — Encounter: Payer: Medicare Other | Admitting: Surgery

## 2016-06-30 DIAGNOSIS — E1140 Type 2 diabetes mellitus with diabetic neuropathy, unspecified: Secondary | ICD-10-CM | POA: Diagnosis not present

## 2016-06-30 DIAGNOSIS — I10 Essential (primary) hypertension: Secondary | ICD-10-CM | POA: Diagnosis not present

## 2016-06-30 DIAGNOSIS — Z8551 Personal history of malignant neoplasm of bladder: Secondary | ICD-10-CM | POA: Diagnosis not present

## 2016-06-30 DIAGNOSIS — Z794 Long term (current) use of insulin: Secondary | ICD-10-CM | POA: Diagnosis not present

## 2016-06-30 DIAGNOSIS — S81812A Laceration without foreign body, left lower leg, initial encounter: Secondary | ICD-10-CM | POA: Diagnosis not present

## 2016-06-30 DIAGNOSIS — Z85828 Personal history of other malignant neoplasm of skin: Secondary | ICD-10-CM | POA: Diagnosis not present

## 2016-06-30 DIAGNOSIS — E11621 Type 2 diabetes mellitus with foot ulcer: Secondary | ICD-10-CM | POA: Diagnosis not present

## 2016-06-30 DIAGNOSIS — S81811A Laceration without foreign body, right lower leg, initial encounter: Secondary | ICD-10-CM | POA: Diagnosis not present

## 2016-07-01 ENCOUNTER — Telehealth: Payer: Self-pay | Admitting: Endocrinology

## 2016-07-01 NOTE — Telephone Encounter (Signed)
[name], Could you contact the patient and advise his refill is ready for pick up? Thanks!

## 2016-07-01 NOTE — Telephone Encounter (Signed)
I printed  

## 2016-07-01 NOTE — Telephone Encounter (Signed)
See message. Patient is requesting a refill on his Hydrocodone. Thanks!

## 2016-07-01 NOTE — Progress Notes (Signed)
TAVARIS, STEFKA ([id number]) Visit Report for 06/30/2016 Arrival Information Details Patient Name: [name], [name] Date of Service: 06/30/2016 12:45 PM Medical Record Number: [id number] Patient Account Number: [id number] Date of Birth/Sex: 09/06/1955 (60 y.o. Male) Treating RN: Montey Hora Primary Care Physician: [name] Other Clinician: Referring Physician: [name] Treating Physician/Extender: Frann Rider in Treatment: 2 Visit Information History Since Last Visit Added or deleted any medications: No Patient Arrived: Ambulatory Any new allergies or adverse reactions: No Arrival Time: 12:49 Had a fall or experienced change in No Accompanied By: spouse activities of daily living that may affect Transfer Assistance: None risk of falls: Patient Identification Verified: Yes Signs or symptoms of abuse/neglect since last No Secondary Verification Process Yes visito Completed: Hospitalized since last visit: No Patient Has Alerts: Yes Pain Present Now: No Patient Alerts: Patient on Blood Thinner DM Type II Electronic Signature(s) Signed: 06/30/2016 5:38:29 PM By: Montey Hora Entered By: Montey Hora on 06/30/2016 12:52:18 [name] ([id number]) -------------------------------------------------------------------------------- Encounter Discharge Information Details Patient Name: [name] Date of Service: 06/30/2016 12:45 PM Medical Record Number: [id number] Patient Account Number: [id number] Date of Birth/Sex: 03-07-1956 (60 y.o. Male) Treating RN: Montey Hora Primary Care Physician: [name] Other Clinician: Referring Physician: [name] Treating Physician/Extender: Frann Rider in Treatment: 2 Encounter Discharge Information Items Discharge Pain Level: 0 Discharge Condition: Stable Ambulatory Status: Wheelchair Discharge Destination: Home Transportation: Private Auto Accompanied By: spouse Schedule Follow-up  Appointment: Yes Medication Reconciliation completed and provided to Patient/Care No Dulcemaria Bula: Provided on Clinical Summary of Care: 06/30/2016 Form Type Recipient Paper Patient TW Electronic Signature(s) Signed: 06/30/2016 5:14:39 PM By: Montey Hora Previous Signature: 06/30/2016 1:39:31 PM Version By: Ruthine Dose Entered By: Montey Hora on 06/30/2016 17:14:39 [name], [name] ([id number]) -------------------------------------------------------------------------------- Multi Wound Chart Details Patient Name: [name] Date of Service: 06/30/2016 12:45 PM Medical Record Number: [id number] Patient Account Number: [id number] Date of Birth/Sex: October 08, 1955 (60 y.o. Male) Treating RN: Montey Hora Primary Care Physician: [name] Other Clinician: Referring Physician: [name] Treating Physician/Extender: Frann Rider in Treatment: 2 Vital Signs Height(in): 68 Pulse(bpm): 76 Weight(lbs): 202 Blood Pressure 153/74 (mmHg): Body Mass Index(BMI): 31 Temperature(F): Respiratory Rate 16 (breaths/min): Photos: [N/A:N/A] Wound Location: Right Lower Leg Left Lower Leg - Medial N/A Wounding Event: Trauma Trauma N/A Primary Etiology: Diabetic Wound/Ulcer of Diabetic Wound/Ulcer of N/A the Lower Extremity the Lower Extremity Secondary Etiology: Trauma, Other Trauma, Other N/A Comorbid History: Cataracts, Deep Vein Cataracts, Deep Vein N/A Thrombosis, Thrombosis, Hypertension, Type II Hypertension, Type II Diabetes, Neuropathy Diabetes, Neuropathy Date Acquired: 06/05/2016 06/05/2016 N/A Weeks of Treatment: 2 2 N/A Wound Status: Open Open N/A Measurements L x W x D 12.5x1x0.2 4.5x0.3x0.1 N/A (cm) Area (cm) : 9.817 1.06 N/A Volume (cm) : 1.963 0.106 N/A % Reduction in Area: 3.80% 70.00% N/A % Reduction in Volume: -92.30% 70.00% N/A Classification: Grade 1 Grade 1 N/A Exudate Amount: Medium None Present N/A Exudate Type: Sanguinous N/A  N/A Exudate Color: red N/A N/A Wound Margin: Flat and Intact Flat and Intact N/A URIAN, [name]. ([id number]) Granulation Amount: None Present (0%) Medium (34-66%) N/A Necrotic Amount: Large (67-100%) Medium (34-66%) N/A Necrotic Tissue: Eschar, Adherent Slough Eschar N/A Exposed Structures: Fascia: No Fascia: No N/A Fat: No Fat: No Tendon: No Tendon: No Muscle: No Muscle: No Joint: No Joint: No Bone: No Bone: No Limited to Skin Limited to Skin Breakdown Breakdown Epithelialization: None Medium (34-66%) N/A Periwound Skin Texture: Edema: Yes Scarring: Yes N/A Induration: Yes Edema: No  Scarring: Yes Excoriation: No Excoriation: No Induration: No Callus: No Callus: No Crepitus: No Crepitus: No Fluctuance: No Fluctuance: No Friable: No Friable: No Rash: No Rash: No Periwound Skin Maceration: No Moist: Yes N/A Moisture: Moist: No Maceration: No Dry/Scaly: No Dry/Scaly: No Periwound Skin Color: Atrophie Blanche: No Ecchymosis: Yes N/A Cyanosis: No Atrophie Blanche: No Ecchymosis: No Cyanosis: No Erythema: No Erythema: No Hemosiderin Staining: No Hemosiderin Staining: No Mottled: No Mottled: No Pallor: No Pallor: No Rubor: No Rubor: No Temperature: No Abnormality No Abnormality N/A Tenderness on Yes Yes N/A Palpation: Wound Preparation: Ulcer Cleansing: Ulcer Cleansing: N/A Rinsed/Irrigated with Rinsed/Irrigated with Saline Saline Topical Anesthetic Topical Anesthetic Applied: Other: lidocaine Applied: Other: lidocane 4% 4% Treatment Notes Electronic Signature(s) Signed: 06/30/2016 5:38:29 PM By: Montey Hora Entered By: Montey Hora on 06/30/2016 13:13:13 [name], [name] ([id number]) [address], [name] ([id number]) -------------------------------------------------------------------------------- [place] Details Patient Name: [name] Date of Service: 06/30/2016 12:45 PM Medical Record Number: [id number] Patient  Account Number: [id number] Date of Birth/Sex: [birth year] (60 y.o. Male) Treating RN: Montey Hora Primary Care Physician: [name] Other Clinician: Referring Physician: [name] Treating Physician/Extender: Frann Rider in Treatment: 2 Active Inactive Necrotic Tissue Nursing Diagnoses: Impaired tissue integrity related to necrotic/devitalized tissue Knowledge deficit related to management of necrotic/devitalized tissue Goals: Necrotic/devitalized tissue will be minimized in the wound bed Date Initiated: 06/23/2016 Goal Status: Active Patient/caregiver will verbalize understanding of reason and process for debridement of necrotic tissue Date Initiated: 06/23/2016 Goal Status: Active Interventions: Assess patient pain level pre-, during and post procedure and prior to discharge Provide education on necrotic tissue and debridement process Treatment Activities: Apply topical anesthetic as ordered : 06/23/2016 Notes: Nutrition Nursing Diagnoses: Impaired glucose control: actual or potential Goals: Patient/caregiver agrees to and verbalizes understanding of need to obtain nutritional consultation Date Initiated: 06/16/2016 Goal Status: Active Interventions: Assess HgA1c results as ordered upon admission and as needed Notes: [name], [name] ([id number]) Orientation to the Wound Care Program Nursing Diagnoses: Knowledge deficit related to the wound healing center program Goals: Patient/caregiver will verbalize understanding of the [facility] Date Initiated: 06/16/2016 Goal Status: Active Interventions: Provide education on orientation to the wound center Notes: Wound/Skin Impairment Nursing Diagnoses: Impaired tissue integrity Goals: Ulcer/skin breakdown will heal within 14 weeks Date Initiated: 06/16/2016 Goal Status: Active Interventions: Assess patient/caregiver ability to obtain necessary supplies Treatment Activities: Refer to  smoking cessation program : 06/16/2016 Notes: Electronic Signature(s) Signed: 06/30/2016 5:38:29 PM By: Montey Hora Entered By: Montey Hora on 06/30/2016 13:13:03 [name] ([id number]) -------------------------------------------------------------------------------- Pain Assessment Details Patient Name: [name] Date of Service: 06/30/2016 12:45 PM Medical Record Number: [id number] Patient Account Number: [id number] Date of Birth/Sex: 08-16-1955 (60 y.o. Male) Treating RN: Montey Hora Primary Care Physician: [name] Other Clinician: Referring Physician: [name] Treating Physician/Extender: Frann Rider in Treatment: 2 Active Problems Location of Pain Severity and Description of Pain Patient Has Paino Yes Site Locations Pain Location: Pain in Ulcers With Dressing Change: Yes Duration of the Pain. Constant / Intermittento Intermittent Pain Management and Medication Current Pain Management: Notes Topical or injectable lidocaine is offered to patient for acute pain when surgical debridement is performed. If needed, Patient is instructed to use over the counter pain medication for the following 24-48 hours after debridement. Wound care MDs do not prescribed pain medications. Patient has chronic pain or uncontrolled pain. Patient has been instructed to make an appointment with their Primary Care Physician for pain management. Electronic Signature(s)  Signed: 06/30/2016 5:38:29 PM By: Montey Hora Entered By: Montey Hora on 06/30/2016 12:52:44 [name] ([id number]) -------------------------------------------------------------------------------- Patient/Caregiver Education Details Patient Name: [name] Date of Service: 06/30/2016 12:45 PM Medical Record Number: [id number] Patient Account Number: [id number] Date of Birth/Gender: 08/23/1955 (60 y.o. Male) Treating RN: Montey Hora Primary Care Physician: [name] Other  Clinician: Referring Physician: [name] Treating Physician/Extender: Frann Rider in Treatment: 2 Education Assessment Education Provided To: Patient and Caregiver Education Topics Provided Wound/Skin Impairment: Handouts: Other: wound care as ordered Methods: Demonstration, Explain/Verbal Responses: State content correctly Electronic Signature(s) Signed: 06/30/2016 5:38:29 PM By: Montey Hora Entered By: Montey Hora on 06/30/2016 17:14:59 [name], [name] ([id number]) -------------------------------------------------------------------------------- Wound Assessment Details Patient Name: [name] Date of Service: 06/30/2016 12:45 PM Medical Record Number: [id number] Patient Account Number: [id number] Date of Birth/Sex: 1956-03-08 (60 y.o. Male) Treating RN: Montey Hora Primary Care Physician: [name] Other Clinician: Referring Physician: [name] Treating Physician/Extender: Frann Rider in Treatment: 2 Wound Status Wound Number: 1 Primary Diabetic Wound/Ulcer of the Lower Etiology: Extremity Wound Location: Right Lower Leg Secondary Trauma, Other Wounding Event: Trauma Etiology: Date Acquired: 06/05/2016 Wound Open Weeks Of Treatment: 2 Status: Clustered Wound: No Comorbid Cataracts, Deep Vein Thrombosis, History: Hypertension, Type II Diabetes, Neuropathy Photos Wound Measurements Length: (cm) 12.5 Width: (cm) 1 Depth: (cm) 0.2 Area: (cm) 9.817 Volume: (cm) 1.963 % Reduction in Area: 3.8% % Reduction in Volume: -92.3% Epithelialization: None Tunneling: No Undermining: No Wound Description Classification: Grade 1 Wound Margin: Flat and Intact Exudate Amount: Medium Exudate Type: Sanguinous Exudate Color: red Foul Odor After Cleansing: No Wound Bed Granulation Amount: None Present (0%) Exposed Structure Necrotic Amount: Large (67-100%) Fascia Exposed: No Necrotic Quality: Eschar, Adherent Slough Fat Layer  Exposed: No [name], [name] ([id number]) Tendon Exposed: No Muscle Exposed: No Joint Exposed: No Bone Exposed: No Limited to Skin Breakdown Periwound Skin Texture Texture Color No Abnormalities Noted: No No Abnormalities Noted: No Callus: No Atrophie Blanche: No Crepitus: No Cyanosis: No Excoriation: No Ecchymosis: No Fluctuance: No Erythema: No Friable: No Hemosiderin Staining: No Induration: Yes Mottled: No Localized Edema: Yes Pallor: No Rash: No Rubor: No Scarring: Yes Temperature / Pain Moisture Temperature: No Abnormality No Abnormalities Noted: No Tenderness on Palpation: Yes Dry / Scaly: No Maceration: No Moist: No Wound Preparation Ulcer Cleansing: Rinsed/Irrigated with Saline Topical Anesthetic Applied: Other: lidocaine 4%, Treatment Notes Wound #1 (Right Lower Leg) 1. Cleansed with: Clean wound with Normal Saline 2. Anesthetic Topical Lidocaine 4% cream to wound bed prior to debridement 3. Peri-wound Care: Skin Prep 4. Dressing Applied: Santyl Ointment Non-Adherent gauze 5. Secondary Dressing Applied Bordered Foam Dressing Gauze and Kerlix/Conform Notes BFD over area of wound that has steri strips on it after staples removed, netting Electronic Signature(s) [name], [name] ([id number]) Signed: 06/30/2016 5:38:29 PM By: Montey Hora Entered By: Montey Hora on 06/30/2016 13:11:33 [name], [name] ([id number]) -------------------------------------------------------------------------------- Wound Assessment Details Patient Name: [name] Date of Service: 06/30/2016 12:45 PM Medical Record Number: [id number] Patient Account Number: [id number] Date of Birth/Sex: 18-Oct-1955 (60 y.o. Male) Treating RN: Montey Hora Primary Care Physician: [name] Other Clinician: Referring Physician: [name] Treating Physician/Extender: Frann Rider in Treatment: 2 Wound Status Wound Number: 2 Primary Diabetic Wound/Ulcer of the  Lower Etiology: Extremity Wound Location: Left Lower Leg - Medial Secondary Trauma, Other Wounding Event: Trauma Etiology: Date Acquired: 06/05/2016 Wound Open Weeks Of Treatment: 2 Status: Clustered Wound: No Comorbid Cataracts, Deep Vein Thrombosis, History: Hypertension,  Type II Diabetes, Neuropathy Photos Wound Measurements Length: (cm) 4.5 Width: (cm) 0.3 Depth: (cm) 0.1 Area: (cm) 1.06 Volume: (cm) 0.106 % Reduction in Area: 70% % Reduction in Volume: 70% Epithelialization: Medium (34-66%) Tunneling: No Undermining: No Wound Description Classification: Grade 1 Wound Margin: Flat and Intact Exudate Amount: None Present Foul Odor After Cleansing: No Wound Bed Granulation Amount: Medium (34-66%) Exposed Structure Necrotic Amount: Medium (34-66%) Fascia Exposed: No Necrotic Quality: Eschar Fat Layer Exposed: No Tendon Exposed: No Muscle Exposed: No [name], [name] ([id number]) Joint Exposed: No Bone Exposed: No Limited to Skin Breakdown Periwound Skin Texture Texture Color No Abnormalities Noted: No No Abnormalities Noted: No Callus: No Atrophie Blanche: No Crepitus: No Cyanosis: No Excoriation: No Ecchymosis: Yes Fluctuance: No Erythema: No Friable: No Hemosiderin Staining: No Induration: No Mottled: No Localized Edema: No Pallor: No Rash: No Rubor: No Scarring: Yes Temperature / Pain Moisture Temperature: No Abnormality No Abnormalities Noted: No Tenderness on Palpation: Yes Dry / Scaly: No Maceration: No Moist: Yes Wound Preparation Ulcer Cleansing: Rinsed/Irrigated with Saline Topical Anesthetic Applied: Other: lidocane 4%, Electronic Signature(s) Signed: 06/30/2016 5:38:29 PM By: Montey Hora Entered By: Montey Hora on 06/30/2016 13:12:04 [name] ([id number]) -------------------------------------------------------------------------------- [place] Details Patient Name: [name] Date of Service: 06/30/2016  12:45 PM Medical Record Number: [id number] Patient Account Number: [id number] Date of Birth/Sex: 1956-02-25 (60 y.o. Male) Treating RN: Montey Hora Primary Care Physician: [name] Other Clinician: Referring Physician: [name] Treating Physician/Extender: Frann Rider in Treatment: 2 Vital Signs Time Taken: 12:53 Pulse (bpm): 76 Height (in): 68 Respiratory Rate (breaths/min): 16 Weight (lbs): 202 Blood Pressure (mmHg): 153/74 Body Mass Index (BMI): 30.7 Reference Range: 80 - 120 mg / dl Electronic Signature(s) Signed: 06/30/2016 5:38:29 PM By: Montey Hora Entered By: Montey Hora on 06/30/2016 12:53:52

## 2016-07-01 NOTE — Telephone Encounter (Signed)
Pt called requesting his monthly prescription be ready for him to pick up.

## 2016-07-01 NOTE — Progress Notes (Signed)
Paul, [name] ([id number]) Visit Report for 06/30/2016 Chief Complaint Document Details Patient Name: [name], [name] Date of Service: 06/30/2016 12:45 PM Medical Record Number: [id number] Patient Account Number: [id number] Date of Birth/Sex: 05/18/1956 (60 y.o. Male) Treating RN: Montey Hora Primary Care Physician: [name] Other Clinician: Referring Physician: [name] Treating Physician/Extender: Frann Rider in Treatment: 2 Information Obtained from: Patient Chief Complaint Patients presents for treatment of an open diabetic ulcer and a recent laceration both lower extremities which happened on 06/05/2016 Electronic Signature(s) Signed: 06/30/2016 1:26:40 PM By: Christin Fudge MD, FACS Entered By: Christin Fudge on 06/30/2016 13:26:39 [name] ([id number]) -------------------------------------------------------------------------------- Debridement Details Patient Name: [name] Date of Service: 06/30/2016 12:45 PM Medical Record Number: [id number] Patient Account Number: [id number] Date of Birth/Sex: 1956-06-25 (60 y.o. Male) Treating RN: Montey Hora Primary Care Physician: [name] Other Clinician: Referring Physician: [name] Treating Physician/Extender: Frann Rider in Treatment: 2 Debridement Performed for Wound #1 Right Lower Leg Assessment: Performed By: Physician Christin Fudge, MD Debridement: Debridement Pre-procedure Yes - 13:11 Verification/Time Out Taken: Start Time: 13:11 Pain Control: Lidocaine 4% Topical Solution Level: Skin/Subcutaneous Tissue Total Area Debrided (L x 12.5 (cm) x 1 (cm) = 12.5 (cm) W): Tissue and other Viable, Non-Viable, Eschar, Fibrin/Slough, Other, Subcutaneous material debrided: Instrument: Forceps, Scissors, Other : staple remover Bleeding: Minimum Hemostasis Achieved: Pressure End Time: 13:18 Procedural Pain: 0 Post Procedural Pain: 0 Response to Treatment: Procedure was  tolerated well Post Debridement Measurements of Total Wound Length: (cm) 12.5 Width: (cm) 1 Depth: (cm) 0.7 Volume: (cm) 6.872 Character of Wound/Ulcer Post Improved Debridement: Severity of Tissue Post Debridement: Fat layer exposed Post Procedure Diagnosis Same as Pre-procedure Notes staples removed an appropriate area Steri-Strips applied. There was a lot of necrotic debris between the staple lines on the inferior aspect of the wound and this was sharply debrided with forceps and scissors. Electronic Signature(s) Signed: 06/30/2016 1:25:25 PM By: Christin Fudge MD, FACS LYNDELL, MEADORS ([id number]) Signed: 06/30/2016 5:38:29 PM By: Montey Hora Entered By: Christin Fudge on 06/30/2016 13:25:25 DAVINCI, HUTCHENS ([id number]) -------------------------------------------------------------------------------- HPI Details Patient Name: [name] Date of Service: 06/30/2016 12:45 PM Medical Record Number: [id number] Patient Account Number: [id number] Date of Birth/Sex: 1956/06/17 (60 y.o. Male) Treating RN: Montey Hora Primary Care Physician: [name] Other Clinician: Referring Physician: [name] Treating Physician/Extender: Frann Rider in Treatment: 2 History of Present Illness Location: bilateral lower extremity lacerations with staples intact on the right lower extremity Quality: Patient reports experiencing a dull pain to affected area(s). Severity: Patient states wound (s) are getting better. Duration: Patient has had the wound for < 2 weeks prior to presenting for treatment Timing: Pain in wound is Intermittent (comes and goes Context: The wound occurred when the patient had lacerations with a metal sheet which had to be repaired in the ER Modifying Factors: Other treatment(s) tried include:as noted he has staples on the right lower extremity and has been on 2 courses of antibiotics Associated Signs and Symptoms: Patient reports having increase  swelling. HPI Description: 60 year old gentleman who is a patient of Dr. Nash Shearer and was seen by him recently on November 1 for a history of having scraped both legs with a piece of metal and was seen in the ER and the right leg was closed with staples. He was placed on Keflex and. Dr. Loanne Drilling treated his diabetes appropriately and also prescribed doxycycline to be taken. 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 he is status post axillary hidradenitis excision, cataract extraction,laser ablation of condyloma, hemorrhoid surgery, incision and drainage of abscess, transurethral resection of bladder tumor. he smokes a pack of cigarettes every day. the patient has been seen by Dr. Fletcher Anon in the past and he has had a workup done which was remote in 2015 which showed the right side showed a ABI of 0.44 and the left was normal. 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 No intervention was done and he was put on Cilostazol. Not find any electronic records of recent tests done since August 2015. 06/23/2016 -- the patient continues to smoke half a pack of cigarettes a day and is working on giving up completely. Electronic Signature(s) Signed: 06/30/2016 1:26:50 PM By: Christin Fudge MD, FACS Entered By: Christin Fudge on 06/30/2016 13:26:49 [name] ([id number]) -------------------------------------------------------------------------------- Physical Exam Details Patient Name: [name] Date of Service: 06/30/2016 12:45 PM Medical Record Number: [id number] Patient Account Number: [id number] Date of Birth/Sex: March 11, 1956 (60 y.o. Male) Treating RN:  Montey Hora Primary Care Physician: [name] Other Clinician: Referring Physician: [name] Treating Physician/Extender: Frann Rider in Treatment: 2 Constitutional . Pulse regular. Respirations normal and unlabored. Afebrile. . Eyes Nonicteric. Reactive to light. Ears, Nose, Mouth, and Throat Lips, teeth, and gums WNL.Marland Kitchen Moist mucosa without lesions. Neck supple and nontender. No palpable supraclavicular or cervical adenopathy. Normal sized without goiter. Respiratory WNL. No retractions.. Cardiovascular Pedal Pulses WNL. No clubbing, cyanosis or edema. Chest Breasts symmetical and no nipple discharge.. Breast tissue WNL, no masses, lumps, or tenderness.. Genitourinary (GU) No hydrocele, spermatocele, tenderness of the cord, or testicular mass.Marland Kitchen Penis without lesions.Lowella Fairy without lesions. No cystocele, or rectocele. Pelvic support intact, no discharge.Marland Kitchen Urethra without masses, tenderness or scarring.Marland Kitchen Lymphatic No adneopathy. No adenopathy. No adenopathy. Musculoskeletal Adexa without tenderness or enlargement.. Digits and nails w/o clubbing, cyanosis, infection, petechiae, ischemia, or inflammatory conditions.. Integumentary (Hair, Skin) No suspicious lesions. No crepitus or fluctuance. No peri-wound warmth or erythema. No masses.Marland Kitchen Psychiatric Judgement and insight Intact.. No evidence of depression, anxiety, or agitation.. Notes the staples needed to come out today and some of the necrotic debris in the inferior part of the wound was sharply debrided with a forcep and scissors down to the subcutaneous tissue and there was no bleeding. Electronic Signature(s) Signed: 06/30/2016 1:27:25 PM By: Christin Fudge MD, FACS Entered By: Christin Fudge on 06/30/2016 13:27:24 [name] ([name] ([id number]) -------------------------------------------------------------------------------- Physician Orders Details Patient Name: [name] Date of Service: 06/30/2016 12:45 PM Medical Record Number: [id number] Patient Account Number: [id number] Date of Birth/Sex: 1956-03-22 (60 y.o. Male) Treating RN: Montey Hora Primary Care Physician: [name] Other Clinician: Referring Physician: [name] Treating Physician/Extender: Frann Rider in Treatment: 2 Verbal / Phone Orders: Yes Clinician: Montey Hora Read Back and Verified: Yes Diagnosis Coding Wound Cleansing Wound #1 Right Lower Leg o Clean wound with Normal Saline. o May shower with protection. Wound #2 Left,Medial Lower Leg o May Shower, gently pat wound dry prior to applying new dressing. Anesthetic Wound #1 Right Lower Leg o Topical Lidocaine 4% cream applied to wound bed prior to debridement - in office only Wound #2 Left,Medial Lower Leg o Topical Lidocaine 4% cream  applied to wound bed prior to debridement - in office only Primary Wound Dressing Wound #1 Right Lower Leg o Santyl Ointment o Medihoney gel - if santyl is too expensive Wound #2 Left,Medial Lower Leg o Other: - may leave this wound open to air now Secondary Dressing Wound #1 Right Lower Leg o Conform/Kerlix o Non-adherent pad o Other - bordered foam dressing over steri strips to be kept on all week Dressing Change Frequency Wound #1 Right Lower Leg o Change dressing every day. Follow-up Appointments Wound #1 Right Lower Leg o Return Appointment in 1 week. ADONIS, CONTO ([id number]) Wound #2 Left,Medial Lower Leg o Return Appointment in 1 week. Edema Control Wound #1 Right Lower Leg o Elevate legs to the level of the heart and pump ankles as often as possible Wound #2 Left,Medial Lower Leg o Elevate legs to the level of the heart and pump ankles as often as possible Additional Orders / Instructions Wound #1 Right Lower Leg o Stop Smoking o Activity as tolerated Wound #2 Left,Medial Lower Leg o Stop Smoking o  Activity as tolerated Medications-please add to medication list. Wound #1 Right Lower Leg o Santyl Enzymatic Ointment o Other: - Medihoney Electronic Signature(s) Signed: 06/30/2016 4:55:45 PM By: Christin Fudge MD, FACS Signed: 06/30/2016 5:38:29 PM By: Montey Hora Previous Signature: 06/30/2016 1:26:22 PM Version By: Christin Fudge MD, FACS Entered By: Montey Hora on 06/30/2016 13:27:31 Bursch, Mora Appl ([id number]) -------------------------------------------------------------------------------- Problem List Details Patient Name: [name] Date of Service: 06/30/2016 12:45 PM Medical Record Number: [id number] Patient Account Number: [id number] Date of Birth/Sex: [birth year] (60 y.o. Male) Treating RN: Montey Hora Primary Care Physician: [name] Other Clinician: Referring Physician: [name] Treating Physician/Extender: Frann Rider in Treatment: 2 Active Problems ICD-10 Encounter Code Description Active Date Diagnosis E11.621 Type 2 diabetes mellitus with foot ulcer 06/16/2016 Yes S81.811A Laceration without foreign body, right lower leg, initial 06/16/2016 Yes encounter [id number] Laceration without foreign body, left lower leg, initial 06/16/2016 Yes encounter F17.218 Nicotine dependence, cigarettes, with other nicotine- 06/16/2016 Yes induced disorders Inactive Problems Resolved Problems Electronic Signature(s) Signed: 06/30/2016 1:23:57 PM By: Christin Fudge MD, FACS Entered By: Christin Fudge on 06/30/2016 13:23:56 [name] ([id number]) -------------------------------------------------------------------------------- Progress Note Details Patient Name: [name] Date of Service: 06/30/2016 12:45 PM Medical Record Number: [id number] Patient Account Number: [id number] Date of Birth/Sex: September 20, 1955 (60 y.o. Male) Treating RN: Montey Hora Primary Care Physician: [name] Other Clinician: Referring Physician: [name] Treating Physician/Extender: Frann Rider in Treatment: 2 Subjective Chief Complaint Information obtained from Patient Patients presents for treatment of an open diabetic ulcer and a recent laceration both lower extremities which happened on 06/05/2016 History of Present Illness (HPI) The following HPI elements were documented for the patient's wound: Location: bilateral lower extremity lacerations with staples intact on the right lower extremity Quality: Patient reports experiencing a dull pain to affected area(s). Severity: Patient states wound (s) are getting better. Duration: Patient has had the wound for < 2 weeks prior to presenting for treatment Timing: Pain in wound is Intermittent (comes and goes Context: The wound occurred when the patient had lacerations with a metal sheet which had to be repaired in the ER Modifying Factors: Other treatment(s) tried include:as noted he has staples on the right lower extremity and has been on 2 courses of antibiotics Associated Signs and Symptoms: Patient reports having increase swelling. 60 year old gentleman who is a patient of Dr. Nash Shearer and was seen by him recently on  November 1 for a history of having scraped both legs with a piece of metal and was seen in the ER and the right leg was closed with staples. He was placed on Keflex and. Dr. Loanne Drilling treated his diabetes appropriately and also prescribed doxycycline to be taken. 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 he is status post axillary hidradenitis excision, cataract extraction,laser ablation of condyloma, hemorrhoid surgery, incision and drainage of abscess, transurethral resection of bladder tumor. he smokes a pack of cigarettes every day. the patient has  been seen by Dr. Fletcher Anon in the past and he has had a workup done which was remote in 2015 which showed the right side showed a ABI of 0.44 and the left was normal. 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 No intervention was done and he was put on Cilostazol. Not find any electronic records of recent tests done since August 2015. 06/23/2016 -- the patient continues to smoke half a pack of cigarettes a day and is working on giving up completely. SWAYDE, EMANUELSON ([id number]) Objective Constitutional Pulse regular. Respirations normal and unlabored. Afebrile. Vitals Time Taken: 12:53 PM, Height: 68 in, Weight: 202 lbs, BMI: 30.7, Pulse: 76 bpm, Respiratory Rate: 16 breaths/min, Blood Pressure: 153/74 mmHg. Eyes Nonicteric. Reactive to light. Ears, Nose, Mouth, and Throat Lips, teeth, and gums WNL.Marland Kitchen Moist mucosa without lesions. Neck supple and nontender. No palpable supraclavicular or cervical adenopathy. Normal sized without goiter. Respiratory WNL. No retractions.. Cardiovascular Pedal Pulses WNL. No clubbing, cyanosis or edema. Chest Breasts symmetical and no nipple discharge.. Breast tissue WNL, no masses, lumps, or tenderness.. Genitourinary (GU) No hydrocele, spermatocele, tenderness of the cord, or testicular mass.Marland Kitchen Penis without lesions.Lowella Fairy without lesions. No cystocele, or rectocele. Pelvic support intact, no discharge.Marland Kitchen Urethra without masses, tenderness or scarring.Marland Kitchen Lymphatic No adneopathy. No adenopathy. No adenopathy. Musculoskeletal Adexa without tenderness or enlargement.. Digits and nails w/o clubbing, cyanosis, infection, petechiae, ischemia, or inflammatory conditions.Marland Kitchen Psychiatric Judgement and insight Intact.. No evidence of depression, anxiety, or agitation.. General Notes: the staples needed to come out today and some of  the necrotic debris in the inferior part of the wound was sharply debrided with a forcep and scissors down to the subcutaneous tissue and there was no bleeding. PAXTEN, GOLLIDAY ([id number]) Integumentary (Hair, Skin) No suspicious lesions. No crepitus or fluctuance. No peri-wound warmth or erythema. No masses.. Wound #1 status is Open. Original cause of wound was Trauma. The wound is located on the Right Lower Leg. The wound measures 12.5cm length x 1cm width x 0.2cm depth; 9.817cm^2 area and 1.963cm^3 volume. The wound is limited to skin breakdown. There is no tunneling or undermining noted. There is a medium amount of sanguinous drainage noted. The wound margin is flat and intact. There is no granulation within the wound bed. There is a large (67-100%) amount of necrotic tissue within the wound bed including Eschar and Adherent Slough. The periwound skin appearance exhibited: Induration, Localized Edema, Scarring. The periwound skin appearance did not exhibit: Callus, Crepitus, Excoriation, Fluctuance, Friable, Rash, Dry/Scaly, Maceration, Moist, Atrophie Blanche, Cyanosis, Ecchymosis, Hemosiderin Staining, Mottled, Pallor, Rubor, Erythema. Periwound temperature was noted as No Abnormality. The periwound has tenderness on palpation. Wound #2 status is Open. Original  cause of wound was Trauma. The wound is located on the Left,Medial Lower Leg. The wound measures 4.5cm length x 0.3cm width x 0.1cm depth; 1.06cm^2 area and 0.106cm^3 volume. The wound is limited to skin breakdown. There is no tunneling or undermining noted. There is a none present amount of drainage noted. The wound margin is flat and intact. There is medium (34-66%) granulation within the wound bed. There is a medium (34-66%) amount of necrotic tissue within the wound bed including Eschar. The periwound skin appearance exhibited: Scarring, Moist, Ecchymosis. The periwound skin appearance did not exhibit: Callus, Crepitus,  Excoriation, Fluctuance, Friable, Induration, Localized Edema, Rash, Dry/Scaly, Maceration, Atrophie Blanche, Cyanosis, Hemosiderin Staining, Mottled, Pallor, Rubor, Erythema. Periwound temperature was noted as No Abnormality. The periwound has tenderness on palpation. Assessment Active Problems ICD-10 E11.621 - Type 2 diabetes mellitus with foot ulcer S81.811A - Laceration without foreign body, right lower leg, initial encounter S81.812A - Laceration without foreign body, left lower leg, initial encounter F17.218 - Nicotine dependence, cigarettes, with other nicotine-induced disorders The left lower extremity is completely healed and I have recommended symptomatic treatment. For the right lower extremity besides application of Steri-Strips on the superior part of the wound the lower part needs to be treated with Santyl ointment for the subcutaneous debris. If this is not affordable we would use Medihoney. He will come back and see me next week DEMETRIC, NORTHRIP. ([id number]) Procedures Wound #1 Wound #1 is a Diabetic Wound/Ulcer of the Lower Extremity located on the Right Lower Leg . There was a Skin/Subcutaneous Tissue Debridement [id number]) debridement with total area of 12.5 sq cm performed by Christin Fudge, MD. with the following instrument(s): Forceps, staple remover, and Scissors to remove Viable and Non-Viable tissue/material including Fibrin/Slough, Eschar, Other, and Subcutaneous after achieving pain control using Lidocaine 4% Topical Solution. A time out was conducted at 13:11, prior to the start of the procedure. A Minimum amount of bleeding was controlled with Pressure. The procedure was tolerated well with a pain level of 0 throughout and a pain level of 0 following the procedure. Post Debridement Measurements: 12.5cm length x 1cm width x 0.7cm depth; 6.872cm^3 volume. Character of Wound/Ulcer Post Debridement is improved. Severity of Tissue Post Debridement is: Fat  layer exposed. Post procedure Diagnosis Wound #1: Same as Pre-Procedure General Notes: staples removed an appropriate area Steri-Strips applied. There was a lot of necrotic debris between the staple lines on the inferior aspect of the wound and this was sharply debrided with forceps and scissors.. Plan The following medication(s) was prescribed: Santyl topical 250 unit/gram ointment ointment topical as directed starting 06/30/2016 The left lower extremity is completely healed and I have recommended symptomatic treatment. For the right lower extremity besides application of Steri-Strips on the superior part of the wound the lower part needs to be treated with Santyl ointment for the subcutaneous debris. If this is not affordable we would use Medihoney. He will come back and see me next week Electronic Signature(s) Signed: 06/30/2016 1:28:46 PM By: Christin Fudge MD, FACS Entered By: Christin Fudge on 06/30/2016 13:28:46 [name] ([id number]) -------------------------------------------------------------------------------- SuperBill Details Patient Name: [name] Date of Service: 06/30/2016 Medical Record Number: [id number] Patient Account Number: [id number] Date of Birth/Sex: 09/12/1955 (60 y.o. Male) Treating RN: Montey Hora Primary Care Physician: [name] Other Clinician: Referring Physician: [name] Treating Physician/Extender: Frann Rider in Treatment: 2 Diagnosis Coding ICD-10 Codes Code Description E11.621 Type 2 diabetes mellitus with foot ulcer S81.811A Laceration without foreign body, right  lower leg, initial encounter S81.812A Laceration without foreign body, left lower leg, initial encounter F17.218 Nicotine dependence, cigarettes, with other nicotine-induced disorders Facility Procedures CPT4 Code Description: [id number] - DEB SUBQ TISSUE 20 SQ CM/< ICD-10 Description Diagnosis E11.621 Type 2 diabetes mellitus with foot ulcer  S81.811A Laceration without foreign body, right lower leg, initi S81.812A Laceration without foreign body,  left lower leg, initia F17.218 Nicotine dependence, cigarettes, with other nicotine-in Modifier: al encounte l encounter duced disor Quantity: 1 r ders Physician Procedures CPT4 Code Description: [id number] - WC PHYS SUBQ TISS 20 SQ CM ICD-10 Description Diagnosis E11.621 Type 2 diabetes mellitus with foot ulcer S81.811A Laceration without foreign body, right lower leg, initi S81.812A Laceration without foreign body,  left lower leg, initia F17.218 Nicotine dependence, cigarettes, with other nicotine-in Modifier: al encounte l encounter duced disor Quantity: 1 r ders Electronic Signature(s) Signed: 06/30/2016 1:29:00 PM By: Christin Fudge MD, FACS Entered By: Christin Fudge on 06/30/2016 13:28:59

## 2016-07-02 ENCOUNTER — Other Ambulatory Visit: Payer: Self-pay

## 2016-07-02 MED ORDER — HYDROCODONE-ACETAMINOPHEN 5-325 MG PO TABS: 1.0000 | ORAL_TABLET | Freq: Four times a day (QID) | ORAL | 0 refills | Status: DC | PRN

## 2016-07-07 ENCOUNTER — Encounter: Payer: Medicare Other | Admitting: Surgery

## 2016-07-07 ENCOUNTER — Ambulatory Visit: Payer: Medicare Other | Admitting: Endocrinology

## 2016-07-07 DIAGNOSIS — Z85828 Personal history of other malignant neoplasm of skin: Secondary | ICD-10-CM | POA: Diagnosis not present

## 2016-07-07 DIAGNOSIS — S81812A Laceration without foreign body, left lower leg, initial encounter: Secondary | ICD-10-CM | POA: Diagnosis not present

## 2016-07-07 DIAGNOSIS — E1140 Type 2 diabetes mellitus with diabetic neuropathy, unspecified: Secondary | ICD-10-CM | POA: Diagnosis not present

## 2016-07-07 DIAGNOSIS — I10 Essential (primary) hypertension: Secondary | ICD-10-CM | POA: Diagnosis not present

## 2016-07-07 DIAGNOSIS — Z8551 Personal history of malignant neoplasm of bladder: Secondary | ICD-10-CM | POA: Diagnosis not present

## 2016-07-07 DIAGNOSIS — S81811A Laceration without foreign body, right lower leg, initial encounter: Secondary | ICD-10-CM | POA: Diagnosis not present

## 2016-07-07 DIAGNOSIS — L97812 Non-pressure chronic ulcer of other part of right lower leg with fat layer exposed: Secondary | ICD-10-CM | POA: Diagnosis not present

## 2016-07-07 DIAGNOSIS — Z794 Long term (current) use of insulin: Secondary | ICD-10-CM | POA: Diagnosis not present

## 2016-07-07 DIAGNOSIS — E11621 Type 2 diabetes mellitus with foot ulcer: Secondary | ICD-10-CM | POA: Diagnosis not present

## 2016-07-07 DIAGNOSIS — E11622 Type 2 diabetes mellitus with other skin ulcer: Secondary | ICD-10-CM | POA: Diagnosis not present

## 2016-07-08 NOTE — Progress Notes (Signed)
[name], [name] ([id number]) Visit Report for 07/07/2016 Chief Complaint Document Details Patient Name: [name], [name] Date of Service: 07/07/2016 2:15 PM Medical Record Number: [id number] Patient Account Number: [id number] Date of Birth/Sex: [birth year] (60 y.o. Male) Treating RN: Montey Hora Primary Care Physician: [name] Other Clinician: Referring Physician: [name] Treating Physician/Extender: Frann Rider in Treatment: 3 Information Obtained from: Patient Chief Complaint Patients presents for treatment of an open diabetic ulcer and a recent laceration both lower extremities which happened on 06/05/2016 Electronic Signature(s) Signed: 07/07/2016 2:56:23 PM By: Christin Fudge MD, FACS Entered By: Christin Fudge on 07/07/2016 14:56:23 [name] ([id number]) -------------------------------------------------------------------------------- Debridement Details Patient Name: [name] Date of Service: 07/07/2016 2:15 PM Medical Record Number: [id number] Patient Account Number: [id number] Date of Birth/Sex: 18-Jul-1956 (60 y.o. Male) Treating RN: Montey Hora Primary Care Physician: [name] Other Clinician: Referring Physician: [name] Treating Physician/Extender: Frann Rider in Treatment: 3 Debridement Performed for Wound #1 Right Lower Leg Assessment: Performed By: Physician Christin Fudge, MD Debridement: Debridement Pre-procedure Yes - 14:51 Verification/Time Out Taken: Start Time: 14:51 Pain Control: Lidocaine 4% Topical Solution Level: Skin/Subcutaneous Tissue Total Area Debrided (L x 11.2 (cm) x 1.3 (cm) = 14.56 (cm) W): Tissue and other Viable, Non-Viable, Eschar, Fibrin/Slough, Subcutaneous material debrided: Instrument: Forceps, Scissors Bleeding: Minimum Hemostasis Achieved: Pressure End Time: 14:53 Procedural Pain: 0 Post Procedural Pain: 0 Response to Treatment: Procedure was tolerated well Post  Debridement Measurements of Total Wound Length: (cm) 11.2 Width: (cm) 1.3 Depth: (cm) 0.2 Volume: (cm) 2.287 Character of Wound/Ulcer Post Improved Debridement: Severity of Tissue Post Debridement: Fat layer exposed Post Procedure Diagnosis Same as Pre-procedure Electronic Signature(s) Signed: 07/07/2016 2:56:17 PM By: Christin Fudge MD, FACS Signed: 07/07/2016 5:14:32 PM By: Montey Hora Entered By: Christin Fudge on 07/07/2016 14:56:17 Gerke, Mora Appl ([name] ([id number]) -------------------------------------------------------------------------------- HPI Details Patient Name: [name] Date of Service: 07/07/2016 2:15 PM Medical Record Number: [id number] Patient Account Number: [id number] Date of Birth/Sex: 1956/05/07 (60 y.o. Male) Treating RN: Montey Hora Primary Care Physician: [name] Other Clinician: Referring Physician: [name] Treating Physician/Extender: Frann Rider in Treatment: 3 History of Present Illness Location: bilateral lower extremity lacerations with staples intact on the right lower extremity Quality: Patient reports experiencing a dull pain to affected area(s). Severity: Patient states wound (s) are getting better. Duration: Patient has had the wound for < 2 weeks prior to presenting for treatment Timing: Pain in wound is Intermittent (comes and goes Context: The wound occurred when the patient had lacerations with a metal sheet which had to be repaired in the ER Modifying Factors: Other treatment(s) tried include:as noted he has staples on the right lower extremity and has been on 2 courses of antibiotics Associated Signs and Symptoms: Patient reports having increase swelling. HPI Description: 60 year old gentleman who is a patient of Dr. Nash Shearer and was seen by him recently on November 1 for a history of having scraped both legs with a piece of metal and was seen in the ER and the right leg was  closed with staples. He was placed on Keflex and. Dr. Loanne Drilling treated his diabetes appropriately and also prescribed doxycycline to be taken. 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 he is status post axillary hidradenitis excision, cataract extraction,laser ablation of condyloma, hemorrhoid surgery, incision and drainage of  abscess, transurethral resection of bladder tumor. he smokes a pack of cigarettes every day. the patient has been seen by Dr. Fletcher Anon in the past and he has had a workup done which was remote in 2015 which showed the right side showed a ABI of 0.44 and the left was normal. 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 No intervention was done and he was put on Cilostazol. Not find any electronic records of recent tests done since August 2015. 06/23/2016 -- the patient continues to smoke half a pack of cigarettes a day and is working on giving up completely. 07/07/2016 -- he is using medihoney instead of Santyl as it was not affordable. Electronic Signature(s) Signed: 07/07/2016 2:57:02 PM By: Christin Fudge MD, FACS Entered By: Christin Fudge on 07/07/2016 14:57:01 Stupka, Mora Appl ([id number]) -------------------------------------------------------------------------------- Physical Exam Details Patient Name: [name] Date of Service: 07/07/2016 2:15 PM Medical Record Number: [id number] Patient Account Number: [id number] Date of Birth/Sex: Nov 19, 1955 (60 y.o. Male) Treating RN: Montey Hora Primary Care Physician: [name] Other Clinician: Referring Physician: [name] Treating Physician/Extender: Frann Rider in  Treatment: 3 Constitutional . Pulse regular. Respirations normal and unlabored. Afebrile. . Eyes Nonicteric. Reactive to light. Ears, Nose, Mouth, and Throat Lips, teeth, and gums WNL.Marland Kitchen Moist mucosa without lesions. Neck supple and nontender. No palpable supraclavicular or cervical adenopathy. Normal sized without goiter. Respiratory WNL. No retractions.. Lymphatic No adneopathy. No adenopathy. No adenopathy. Musculoskeletal Adexa without tenderness or enlargement.. Digits and nails w/o clubbing, cyanosis, infection, petechiae, ischemia, or inflammatory conditions.. Integumentary (Hair, Skin) No suspicious lesions. No crepitus or fluctuance. No peri-wound warmth or erythema. No masses.Marland Kitchen Psychiatric Judgement and insight Intact.. No evidence of depression, anxiety, or agitation.. Notes the Steri-Strips are intact on the superior part of the wound but the inferior part has significant amount of subcutaneous and his debris and using a forcep and scissors are sharply removed as much as I could and there is no active bleeding. Electronic Signature(s) Signed: 07/07/2016 2:57:47 PM By: Christin Fudge MD, FACS Entered By: Christin Fudge on 07/07/2016 14:57:45 Caprara, Mora Appl ([id number]) -------------------------------------------------------------------------------- Physician Orders Details Patient Name: [name] Date of Service: 07/07/2016 2:15 PM Medical Record Number: [id number] Patient Account Number: [id number] Date of Birth/Sex: 15-Oct-1955 (60 y.o. Male) Treating RN: Montey Hora Primary Care Physician: [name] Other Clinician: Referring Physician: [name] Treating Physician/Extender: Frann Rider in Treatment: 3 Verbal / Phone Orders: Yes Clinician: Montey Hora Read Back and Verified: Yes Diagnosis Coding Wound Cleansing Wound #1 Right Lower Leg o Clean wound with Normal Saline. o May shower with protection. Wound #2 Left,Medial Lower  Leg o May Shower, gently pat wound dry prior to applying new dressing. Anesthetic Wound #1 Right Lower Leg o Topical Lidocaine 4% cream applied to wound bed prior to debridement - in office only Wound #2 Left,Medial Lower Leg o Topical Lidocaine 4% cream applied to wound bed prior to debridement - in office only Primary Wound Dressing Wound #1 Right Lower Leg o Medihoney gel Wound #2 Left,Medial Lower Leg o Other: - may leave this wound open to air now Secondary Dressing Wound #1 Right Lower Leg o Conform/Kerlix o Non-adherent pad o Other - bordered foam dressing over steri strips to be kept on all week Dressing Change Frequency Wound #1 Right Lower Leg o Change dressing every day. Follow-up Appointments Wound #1  Right Lower Leg o Return Appointment in 1 week. JIBREEL, LORIG ([id number]) Wound #2 Left,Medial Lower Leg o Return Appointment in 1 week. Edema Control Wound #1 Right Lower Leg o Elevate legs to the level of the heart and pump ankles as often as possible Wound #2 Left,Medial Lower Leg o Elevate legs to the level of the heart and pump ankles as often as possible Additional Orders / Instructions Wound #1 Right Lower Leg o Stop Smoking o Activity as tolerated Wound #2 Left,Medial Lower Leg o Stop Smoking o Activity as tolerated Medications-please add to medication list. Wound #1 Right Lower Leg o Other: - Medihoney Electronic Signature(s) Signed: 07/07/2016 4:38:31 PM By: Christin Fudge MD, FACS Signed: 07/07/2016 5:14:32 PM By: Montey Hora Entered By: Montey Hora on 07/07/2016 14:53:36 Siess, Mora Appl ([id number]) -------------------------------------------------------------------------------- Problem List Details Patient Name: [name] Date of Service: 07/07/2016 2:15 PM Medical Record Number: [id number] Patient Account Number: [id number] Date of Birth/Sex: 19-Mar-1956 (60 y.o. Male) Treating RN: Montey Hora Primary Care Physician: [name] Other Clinician: Referring Physician: [name] Treating Physician/Extender: Frann Rider in Treatment: 3 Active Problems ICD-10 Encounter Code Description Active Date Diagnosis E11.621 Type 2 diabetes mellitus with foot ulcer 06/16/2016 Yes S81.811A Laceration without foreign body, right lower leg, initial 06/16/2016 Yes encounter [id number] Laceration without foreign body, left lower leg, initial 06/16/2016 Yes encounter F17.218 Nicotine dependence, cigarettes, with other nicotine- 06/16/2016 Yes induced disorders Inactive Problems Resolved Problems Electronic Signature(s) Signed: 07/07/2016 2:56:09 PM By: Christin Fudge MD, FACS Entered By: Christin Fudge on 07/07/2016 14:56:09 [name] ([id number]) -------------------------------------------------------------------------------- Progress Note Details Patient Name: [name] Date of Service: 07/07/2016 2:15 PM Medical Record Number: [id number] Patient Account Number: [id number] Date of Birth/Sex: 28-May-1956 (60 y.o. Male) Treating RN: Montey Hora Primary Care Physician: [name] Other Clinician: Referring Physician: [name] Treating Physician/Extender: Frann Rider in Treatment: 3 Subjective Chief Complaint Information obtained from Patient Patients presents for treatment of an open diabetic ulcer and a recent laceration both lower extremities which happened on 06/05/2016 History of Present Illness (HPI) The following HPI elements were documented for the patient's wound: Location: bilateral lower extremity lacerations with staples intact on the right lower extremity Quality: Patient reports experiencing a dull pain to affected area(s). Severity: Patient states wound (s) are getting better. Duration: Patient has had the wound for < 2 weeks prior to presenting for treatment Timing: Pain in wound is Intermittent (comes and goes Context: The  wound occurred when the patient had lacerations with a metal sheet which had to be repaired in the ER Modifying Factors: Other treatment(s) tried include:as noted he has staples on the right lower extremity and has been on 2 courses of antibiotics Associated Signs and Symptoms: Patient reports having increase swelling. 60 year old gentleman who is a patient of Dr. Nash Shearer and was seen by him recently on November 1 for a history of having scraped both legs with a piece of metal and was seen in the ER and the right leg was closed with staples. He was placed on Keflex and. Dr. Loanne Drilling treated his diabetes appropriately and also prescribed doxycycline to be taken. 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 he is status post axillary hidradenitis excision, cataract extraction,laser ablation of condyloma, hemorrhoid surgery, incision and drainage of abscess, transurethral resection of bladder tumor. he smokes  a pack of cigarettes every day. the patient has been seen by Dr. Fletcher Anon in the past and he has had a workup done which was remote in 2015 which showed the right side showed a ABI of 0.44 and the left was normal. 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 No intervention was done and he was put on Cilostazol. Not find any electronic records of recent tests done since August 2015. 06/23/2016 -- the patient continues to smoke half a pack of cigarettes a day and is working on giving up completely. 07/07/2016 -- he is using medihoney instead of Santyl as it was not affordable. JIN, BAKALAR ([id number]) Objective Constitutional Pulse regular. Respirations  normal and unlabored. Afebrile. Vitals Time Taken: 2:15 PM, Height: 68 in, Weight: 202 lbs, BMI: 30.7, Temperature: 98.2 F, Pulse: 85 bpm, Respiratory Rate: 18 breaths/min, Blood Pressure: 141/74 mmHg. Eyes Nonicteric. Reactive to light. Ears, Nose, Mouth, and Throat Lips, teeth, and gums WNL.Marland Kitchen Moist mucosa without lesions. Neck supple and nontender. No palpable supraclavicular or cervical adenopathy. Normal sized without goiter. Respiratory WNL. No retractions.. Lymphatic No adneopathy. No adenopathy. No adenopathy. Musculoskeletal Adexa without tenderness or enlargement.. Digits and nails w/o clubbing, cyanosis, infection, petechiae, ischemia, or inflammatory conditions.Marland Kitchen Psychiatric Judgement and insight Intact.. No evidence of depression, anxiety, or agitation.. General Notes: the Steri-Strips are intact on the superior part of the wound but the inferior part has significant amount of subcutaneous and his debris and using a forcep and scissors are sharply removed as much as I could and there is no active bleeding. Integumentary (Hair, Skin) No suspicious lesions. No crepitus or fluctuance. No peri-wound warmth or erythema. No masses.. Wound #1 status is Open. Original cause of wound was Trauma. The wound is located on the Right Lower Leg. The wound measures 11.2cm length x 1.3cm width x 0.2cm depth; 11.435cm^2 area and 2.287cm^3 volume. The wound is limited to skin breakdown. There is no tunneling or undermining noted. There is a medium amount of sanguinous drainage noted. The wound margin is flat and intact. There is small (1-33%) pink granulation within the wound bed. There is a large (67-100%) amount of necrotic tissue within the NELSE, HOAGUE. ([id number]) wound bed including Eschar and Adherent Slough. The periwound skin appearance exhibited: Induration, Localized Edema, Scarring. The periwound skin appearance did not exhibit: Callus, Crepitus, Excoriation, Fluctuance,  Friable, Rash, Dry/Scaly, Maceration, Moist, Atrophie Blanche, Cyanosis, Ecchymosis, Hemosiderin Staining, Mottled, Pallor, Rubor, Erythema. Periwound temperature was noted as No Abnormality. The periwound has tenderness on palpation. Wound #2 status is Open. Original cause of wound was Trauma. The wound is located on the Left,Medial Lower Leg. The wound measures 0.1cm length x 0.1cm width x 0.1cm depth; 0.008cm^2 area and 0.001cm^3 volume. The wound is limited to skin breakdown. There is no tunneling or undermining noted. There is a none present amount of drainage noted. The wound margin is flat and intact. There is no granulation within the wound bed. There is a large (67-100%) amount of necrotic tissue within the wound bed including Eschar. The periwound skin appearance exhibited: Scarring, Moist, Ecchymosis. The periwound skin appearance did not exhibit: Callus, Crepitus, Excoriation, Fluctuance, Friable, Induration, Localized Edema, Rash, Dry/Scaly, Maceration, Atrophie Blanche, Cyanosis, Hemosiderin Staining, Mottled, Pallor, Rubor, Erythema. Periwound temperature was noted as No Abnormality. The periwound has tenderness on palpation. Assessment Active Problems ICD-10 E11.621 - Type 2  diabetes mellitus with foot ulcer S81.811A - Laceration without foreign body, right lower leg, initial encounter S81.812A - Laceration without foreign body, left lower leg, initial encounter F17.218 - Nicotine dependence, cigarettes, with other nicotine-induced disorders Procedures Wound #1 Wound #1 is a Diabetic Wound/Ulcer of the Lower Extremity located on the Right Lower Leg . There was a Skin/Subcutaneous Tissue Debridement [id number]) debridement with total area of 14.56 sq cm performed by Christin Fudge, MD. with the following instrument(s): Forceps and Scissors to remove Viable and Non-Viable tissue/material including Fibrin/Slough, Eschar, and Subcutaneous after achieving pain control using  Lidocaine 4% Topical Solution. A time out was conducted at 14:51, prior to the start of the procedure. A Minimum amount of bleeding was controlled with Pressure. The procedure was tolerated well with a pain level of 0 throughout and a pain level of 0 following the procedure. Post Debridement Measurements: 11.2cm length x 1.3cm width x 0.2cm depth; 2.287cm^3 volume. Character of Wound/Ulcer Post Debridement is improved. Severity of Tissue Post Debridement is: Fat layer exposed. Post procedure Diagnosis Wound #1: Same as Pre-Procedure JEMIL, STGERMAIN. ([id number]) Plan Wound Cleansing: Wound #1 Right Lower Leg: Clean wound with Normal Saline. May shower with protection. Wound #2 Left,Medial Lower Leg: May Shower, gently pat wound dry prior to applying new dressing. Anesthetic: Wound #1 Right Lower Leg: Topical Lidocaine 4% cream applied to wound bed prior to debridement - in office only Wound #2 Left,Medial Lower Leg: Topical Lidocaine 4% cream applied to wound bed prior to debridement - in office only Primary Wound Dressing: Wound #1 Right Lower Leg: Medihoney gel Wound #2 Left,Medial Lower Leg: Other: - may leave this wound open to air now Secondary Dressing: Wound #1 Right Lower Leg: Conform/Kerlix Non-adherent pad Other - bordered foam dressing over steri strips to be kept on all week Dressing Change Frequency: Wound #1 Right Lower Leg: Change dressing every day. Follow-up Appointments: Wound #1 Right Lower Leg: Return Appointment in 1 week. Wound #2 Left,Medial Lower Leg: Return Appointment in 1 week. Edema Control: Wound #1 Right Lower Leg: Elevate legs to the level of the heart and pump ankles as often as possible Wound #2 Left,Medial Lower Leg: Elevate legs to the level of the heart and pump ankles as often as possible Additional Orders / Instructions: Wound #1 Right Lower Leg: Stop Smoking Activity as tolerated Wound #2 Left,Medial Lower Leg: Stop  Smoking Activity as tolerated Medications-please add to medication list.: KAYRON, REMINGTON ([id number]) Wound #1 Right Lower Leg: Other: - Medihoney The left lower extremity is completely healed and I have recommended symptomatic treatment. For the right lower extremity besides application of Steri-Strips on the superior part of the wound the lower part needs Medihoney get rid of the subcutaneous differed he as Annitta Needs was not affordable. He will come back and see me next week Electronic Signature(s) Signed: 07/07/2016 2:59:07 PM By: Christin Fudge MD, FACS Entered By: Christin Fudge on 07/07/2016 14:59:07 [name] ([id number]) -------------------------------------------------------------------------------- SuperBill Details Patient Name: [name] Date of Service: 07/07/2016 Medical Record Number: [id number] Patient Account Number: [id number] Date of Birth/Sex: [birth year] (60 y.o. Male) Treating RN: Montey Hora Primary Care Physician: [name] Other Clinician: Referring Physician: [name] Treating Physician/Extender: Frann Rider in Treatment: 3 Diagnosis Coding ICD-10 Codes Code Description E11.621 Type 2 diabetes mellitus with foot ulcer S81.811A Laceration without foreign body, right lower leg, initial encounter S81.812A Laceration without foreign body, left lower leg, initial encounter F17.218 Nicotine dependence, cigarettes, with other nicotine-induced disorders Facility  Procedures CPT4 Code Description: [id number] - DEB SUBQ TISSUE 20 SQ CM/< ICD-10 Description Diagnosis E11.621 Type 2 diabetes mellitus with foot ulcer S81.811A Laceration without foreign body, right lower leg, initi F17.218 Nicotine dependence, cigarettes,  with other nicotine-in Modifier: al encounte duced disor Quantity: 1 r ders Physician Procedures CPT4 Code Description: [id number] - WC PHYS SUBQ TISS 20 SQ CM ICD-10 Description Diagnosis E11.621 Type 2 diabetes  mellitus with foot ulcer S81.811A Laceration without foreign body, right lower leg, initi F17.218 Nicotine dependence, cigarettes, with  other nicotine-in Modifier: al encounte duced disor Quantity: 1 r ders Electronic Signature(s) Signed: 07/07/2016 2:59:18 PM By: Christin Fudge MD, FACS Entered By: Christin Fudge on 07/07/2016 14:59:18

## 2016-07-08 NOTE — Progress Notes (Signed)
CRANFORD, SIFUENTEZ ([id number]) Visit Report for 07/07/2016 Arrival Information Details Patient Name: [name], [name] Date of Service: 07/07/2016 2:15 PM Medical Record Number: [id number] Patient Account Number: [id number] Date of Birth/Sex: [birth year] (60 y.o. Male) Treating RN: Montey Hora Primary Care Physician: [name] Other Clinician: Referring Physician: [name] Treating Physician/Extender: Frann Rider in Treatment: 3 Visit Information History Since Last Visit Added or deleted any medications: No Patient Arrived: Ambulatory Any new allergies or adverse reactions: No Arrival Time: 14:14 Had a fall or experienced change in No Accompanied By: spouse activities of daily living that may affect Transfer Assistance: None risk of falls: Patient Identification Verified: Yes Signs or symptoms of abuse/neglect since last No Secondary Verification Process Yes visito Completed: Hospitalized since last visit: No Patient Has Alerts: Yes Pain Present Now: No Patient Alerts: Patient on Blood Thinner DM Type II Electronic Signature(s) Signed: 07/07/2016 5:14:32 PM By: Montey Hora Entered By: Montey Hora on 07/07/2016 14:14:57 [name], [name] ([id number]) -------------------------------------------------------------------------------- Encounter Discharge Information Details Patient Name: [name] Date of Service: 07/07/2016 2:15 PM Medical Record Number: [id number] Patient Account Number: [id number] Date of Birth/Sex: [birth year] (60 y.o. Male) Treating RN: Montey Hora Primary Care Physician: [name] Other Clinician: Referring Physician: [name] Treating Physician/Extender: Frann Rider in Treatment: 3 Encounter Discharge Information Items Discharge Pain Level: 0 Discharge Condition: Stable Ambulatory Status: Walker Discharge Destination: Home Transportation: Private Auto Accompanied By: self Schedule Follow-up Appointment:  Yes Medication Reconciliation completed and provided to Patient/Care No Claribel Sachs: Provided on Clinical Summary of Care: 07/07/2016 Form Type Recipient Paper Patient TW Electronic Signature(s) Signed: 07/07/2016 5:14:32 PM By: Montey Hora Previous Signature: 07/07/2016 3:03:38 PM Version By: Ruthine Dose Entered By: Montey Hora on 07/07/2016 15:43:01 Stallsmith, [name] ([id number]) -------------------------------------------------------------------------------- Lower Extremity Assessment Details Patient Name: [name] Date of Service: 07/07/2016 2:15 PM Medical Record Number: [id number] Patient Account Number: [id number] Date of Birth/Sex: 1955-11-27 (60 y.o. Male) Treating RN: Montey Hora Primary Care Physician: [name] Other Clinician: Referring Physician: [name] Treating Physician/Extender: Frann Rider in Treatment: 3 Edema Assessment Assessed: [Left: No] [Right: No] Edema: [Left: Ye] [Right: s] Calf Left: Right: Point of Measurement: 34 cm From Medial Instep cm cm Ankle Left: Right: Point of Measurement: 12 cm From Medial Instep cm cm Vascular Assessment Pulses: Posterior Tibial Dorsalis Pedis Palpable: [Right:Yes] Extremity colors, hair growth, and conditions: Extremity Color: [Right:Normal] Hair Growth on Extremity: [Right:Yes] Temperature of Extremity: [Right:Warm] Capillary Refill: [Right:< 3 seconds] Electronic Signature(s) Signed: 07/07/2016 5:14:32 PM By: Montey Hora Entered By: Montey Hora on 07/07/2016 14:22:04 [name] ([id number]) -------------------------------------------------------------------------------- Multi Wound Chart Details Patient Name: [name] Date of Service: 07/07/2016 2:15 PM Medical Record Number: [id number] Patient Account Number: [id number] Date of Birth/Sex: 11/11/1955 (60 y.o. Male) Treating RN: Montey Hora Primary Care Physician: [name] Other Clinician: Referring  Physician: [name] Treating Physician/Extender: Frann Rider in Treatment: 3 Vital Signs Height(in): 68 Pulse(bpm): 85 Weight(lbs): 202 Blood Pressure 141/74 (mmHg): Body Mass Index(BMI): 31 Temperature(F): 98.2 Respiratory Rate 18 (breaths/min): Photos: [N/A:N/A] Wound Location: Right Lower Leg Left Lower Leg - Medial N/A Wounding Event: Trauma Trauma N/A Primary Etiology: Diabetic Wound/Ulcer of Diabetic Wound/Ulcer of N/A the Lower Extremity the Lower Extremity Secondary Etiology: Trauma, Other Trauma, Other N/A Comorbid History: Cataracts, Deep Vein Cataracts, Deep Vein N/A Thrombosis, Thrombosis, Hypertension, Type II Hypertension, Type II Diabetes, Neuropathy Diabetes, Neuropathy Date Acquired: 06/05/2016 06/05/2016 N/A Weeks of Treatment: 3 3 N/A Wound Status: Open Open N/A  Measurements L x W x D 11.2x1.3x0.2 0.1x0.1x0.1 N/A (cm) Area (cm) : 11.435 0.008 N/A Volume (cm) : 2.287 0.001 N/A % Reduction in Area: -12.00% 99.80% N/A % Reduction in Volume: -124.00% 99.70% N/A Classification: Grade 1 Grade 1 N/A Exudate Amount: Medium None Present N/A Exudate Type: Sanguinous N/A N/A Exudate Color: red N/A N/A Wound Margin: Flat and Intact Flat and Intact N/A [name], [name]. ([id number]) Granulation Amount: Small (1-33%) None Present (0%) N/A Granulation Quality: Pink N/A N/A Necrotic Amount: Large (67-100%) Large (67-100%) N/A Necrotic Tissue: Eschar, Adherent Slough Eschar N/A Exposed Structures: Fascia: No Fascia: No N/A Fat: No Fat: No Tendon: No Tendon: No Muscle: No Muscle: No Joint: No Joint: No Bone: No Bone: No Limited to Skin Limited to Skin Breakdown Breakdown Epithelialization: None Medium (34-66%) N/A Periwound Skin Texture: Edema: Yes Scarring: Yes N/A Induration: Yes Edema: No Scarring: Yes Excoriation: No Excoriation: No Induration: No Callus: No Callus: No Crepitus: No Crepitus: No Fluctuance: No Fluctuance:  No Friable: No Friable: No Rash: No Rash: No Periwound Skin Maceration: No Moist: Yes N/A Moisture: Moist: No Maceration: No Dry/Scaly: No Dry/Scaly: No Periwound Skin Color: Atrophie Blanche: No Ecchymosis: Yes N/A Cyanosis: No Atrophie Blanche: No Ecchymosis: No Cyanosis: No Erythema: No Erythema: No Hemosiderin Staining: No Hemosiderin Staining: No Mottled: No Mottled: No Pallor: No Pallor: No Rubor: No Rubor: No Temperature: No Abnormality No Abnormality N/A Tenderness on Yes Yes N/A Palpation: Wound Preparation: Ulcer Cleansing: Ulcer Cleansing: N/A Rinsed/Irrigated with Rinsed/Irrigated with Saline Saline Topical Anesthetic Topical Anesthetic Applied: Other: lidocaine Applied: None 4% Treatment Notes Electronic Signature(s) Signed: 07/07/2016 5:14:32 PM By: Montey Hora Entered By: Montey Hora on 07/07/2016 14:25:48 [name], [name] ([id number]) [address] [name] ([id number]) -------------------------------------------------------------------------------- Ballplay Details Patient Name: [name] Date of Service: 07/07/2016 2:15 PM Medical Record Number: [id number] Patient Account Number: [id number] Date of Birth/Sex: 06-26-1956 (60 y.o. Male) Treating RN: Montey Hora Primary Care Physician: [name] Other Clinician: Referring Physician: [name] Treating Physician/Extender: Frann Rider in Treatment: 3 Active Inactive Necrotic Tissue Nursing Diagnoses: Impaired tissue integrity related to necrotic/devitalized tissue Knowledge deficit related to management of necrotic/devitalized tissue Goals: Necrotic/devitalized tissue will be minimized in the wound bed Date Initiated: 06/23/2016 Goal Status: Active Patient/caregiver will verbalize understanding of reason and process for debridement of necrotic tissue Date Initiated: 06/23/2016 Goal Status: Active Interventions: Assess patient pain level pre-,  during and post procedure and prior to discharge Provide education on necrotic tissue and debridement process Treatment Activities: Apply topical anesthetic as ordered : 06/23/2016 Notes: Nutrition Nursing Diagnoses: Impaired glucose control: actual or potential Goals: Patient/caregiver agrees to and verbalizes understanding of need to obtain nutritional consultation Date Initiated: 06/16/2016 Goal Status: Active Interventions: Assess HgA1c results as ordered upon admission and as needed Notes: [name], [name] ([id number]) Orientation to the Wound Care Program Nursing Diagnoses: Knowledge deficit related to the wound healing center program Goals: Patient/caregiver will verbalize understanding of the [facility] Date Initiated: 06/16/2016 Goal Status: Active Interventions: Provide education on orientation to the wound center Notes: Wound/Skin Impairment Nursing Diagnoses: Impaired tissue integrity Goals: Ulcer/skin breakdown will heal within 14 weeks Date Initiated: 06/16/2016 Goal Status: Active Interventions: Assess patient/caregiver ability to obtain necessary supplies Treatment Activities: Refer to smoking cessation program : 06/16/2016 Notes: Electronic Signature(s) Signed: 07/07/2016 5:14:32 PM By: Montey Hora Entered By: Montey Hora on 07/07/2016 14:25:40 [name], [name] ([id number]) -------------------------------------------------------------------------------- Pain Assessment Details Patient Name: [name] Date of Service: 07/07/2016 2:15  PM Medical Record Number: [id number] Patient Account Number: [id number] Date of Birth/Sex: 18-Feb-1956 (60 y.o. Male) Treating RN: Montey Hora Primary Care Physician: [name] Other Clinician: Referring Physician: [name] Treating Physician/Extender: Frann Rider in Treatment: 3 Active Problems Location of Pain Severity and Description of Pain Patient Has Paino No Site  Locations Pain Management and Medication Current Pain Management: Notes Topical or injectable lidocaine is offered to patient for acute pain when surgical debridement is performed. If needed, Patient is instructed to use over the counter pain medication for the following 24-48 hours after debridement. Wound care MDs do not prescribed pain medications. Patient has chronic pain or uncontrolled pain. Patient has been instructed to make an appointment with their Primary Care Physician for pain management. Electronic Signature(s) Signed: 07/07/2016 5:14:32 PM By: Montey Hora Entered By: Montey Hora on 07/07/2016 14:15:13 [name], [name] ([id number]) -------------------------------------------------------------------------------- Patient/Caregiver Education Details Patient Name: [name] Date of Service: 07/07/2016 2:15 PM Medical Record Number: [id number] Patient Account Number: [id number] Date of Birth/Gender: Dec 05, 1955 (60 y.o. Male) Treating RN: Montey Hora Primary Care Physician: [name] Other Clinician: Referring Physician: [name] Treating Physician/Extender: Frann Rider in Treatment: 3 Education Assessment Education Provided To: Patient and Caregiver Education Topics Provided Wound/Skin Impairment: Handouts: Other: wound care as ordered Methods: Demonstration, Explain/Verbal Responses: State content correctly Electronic Signature(s) Signed: 07/07/2016 5:14:32 PM By: Montey Hora Entered By: Montey Hora on 07/07/2016 15:43:17 [name], [name] ([id number]) -------------------------------------------------------------------------------- Wound Assessment Details Patient Name: [name] Date of Service: 07/07/2016 2:15 PM Medical Record Number: [id number] Patient Account Number: [id number] Date of Birth/Sex: 1956/07/20 (60 y.o. Male) Treating RN: Montey Hora Primary Care Physician: [name] Other Clinician: Referring  Physician: [name] Treating Physician/Extender: Frann Rider in Treatment: 3 Wound Status Wound Number: 1 Primary Diabetic Wound/Ulcer of the Lower Etiology: Extremity Wound Location: Right Lower Leg Secondary Trauma, Other Wounding Event: Trauma Etiology: Date Acquired: 06/05/2016 Wound Open Weeks Of Treatment: 3 Status: Clustered Wound: No Comorbid Cataracts, Deep Vein Thrombosis, History: Hypertension, Type II Diabetes, Neuropathy Photos Wound Measurements Length: (cm) 11.2 Width: (cm) 1.3 Depth: (cm) 0.2 Area: (cm) 11.435 Volume: (cm) 2.287 % Reduction in Area: -12% % Reduction in Volume: -124% Epithelialization: None Tunneling: No Undermining: No Wound Description Classification: Grade 1 Wound Margin: Flat and Intact Exudate Amount: Medium Exudate Type: Sanguinous Exudate Color: red Foul Odor After Cleansing: No Wound Bed Granulation Amount: Small (1-33%) Exposed Structure Granulation Quality: Pink Fascia Exposed: No Necrotic Amount: Large (67-100%) Fat Layer Exposed: No [name], [name] ([id number]) Necrotic Quality: Eschar, Adherent Slough Tendon Exposed: No Muscle Exposed: No Joint Exposed: No Bone Exposed: No Limited to Skin Breakdown Periwound Skin Texture Texture Color No Abnormalities Noted: No No Abnormalities Noted: No Callus: No Atrophie Blanche: No Crepitus: No Cyanosis: No Excoriation: No Ecchymosis: No Fluctuance: No Erythema: No Friable: No Hemosiderin Staining: No Induration: Yes Mottled: No Localized Edema: Yes Pallor: No Rash: No Rubor: No Scarring: Yes Temperature / Pain Moisture Temperature: No Abnormality No Abnormalities Noted: No Tenderness on Palpation: Yes Dry / Scaly: No Maceration: No Moist: No Wound Preparation Ulcer Cleansing: Rinsed/Irrigated with Saline Topical Anesthetic Applied: Other: lidocaine 4%, Treatment Notes Wound #1 (Right Lower Leg) 1. Cleansed with: Clean wound with Normal  Saline 2. Anesthetic Topical Lidocaine 4% cream to wound bed prior to debridement 4. Dressing Applied: Medihoney Gel 5. Secondary Dressing Applied Bordered Foam Dressing Kerlix/Conform Non-Adherent pad Notes BFD over area of wound that has steri strips on it after staples  removed, netting Electronic Signature(s) Signed: 07/07/2016 5:14:32 PM By: Montey Hora Entered By: Montey Hora on 07/07/2016 14:25:01 [name], [name] ([id number]) [address] [name] ([id number]) -------------------------------------------------------------------------------- Wound Assessment Details Patient Name: [name] Date of Service: 07/07/2016 2:15 PM Medical Record Number: [id number] Patient Account Number: [id number] Date of Birth/Sex: Sep 14, 1955 (60 y.o. Male) Treating RN: Montey Hora Primary Care Physician: [name] Other Clinician: Referring Physician: [name] Treating Physician/Extender: Frann Rider in Treatment: 3 Wound Status Wound Number: 2 Primary Diabetic Wound/Ulcer of the Lower Etiology: Extremity Wound Location: Left Lower Leg - Medial Secondary Trauma, Other Wounding Event: Trauma Etiology: Date Acquired: 06/05/2016 Wound Open Weeks Of Treatment: 3 Status: Clustered Wound: No Comorbid Cataracts, Deep Vein Thrombosis, History: Hypertension, Type II Diabetes, Neuropathy Photos Wound Measurements Length: (cm) 0.1 Width: (cm) 0.1 Depth: (cm) 0.1 Area: (cm) 0.008 Volume: (cm) 0.001 % Reduction in Area: 99.8% % Reduction in Volume: 99.7% Epithelialization: Medium (34-66%) Tunneling: No Undermining: No Wound Description Classification: Grade 1 Wound Margin: Flat and Intact Exudate Amount: None Present Foul Odor After Cleansing: No Wound Bed Granulation Amount: None Present (0%) Exposed Structure Necrotic Amount: Large (67-100%) Fascia Exposed: No Necrotic Quality: Eschar Fat Layer Exposed: No Tendon Exposed: No Muscle Exposed: No [name], [name] ([id number]) Joint Exposed: No Bone Exposed: No Limited to Skin Breakdown Periwound Skin Texture Texture Color No Abnormalities Noted: No No Abnormalities Noted: No Callus: No Atrophie Blanche: No Crepitus: No Cyanosis: No Excoriation: No Ecchymosis: Yes Fluctuance: No Erythema: No Friable: No Hemosiderin Staining: No Induration: No Mottled: No Localized Edema: No Pallor: No Rash: No Rubor: No Scarring: Yes Temperature / Pain Moisture Temperature: No Abnormality No Abnormalities Noted: No Tenderness on Palpation: Yes Dry / Scaly: No Maceration: No Moist: Yes Wound Preparation Ulcer Cleansing: Rinsed/Irrigated with Saline Topical Anesthetic Applied: None Treatment Notes Wound #2 (Left, Medial Lower Leg) Notes open to air Electronic Signature(s) Signed: 07/07/2016 5:14:32 PM By: Montey Hora Entered By: Montey Hora on 07/07/2016 14:25:27 [name] ([id number]) -------------------------------------------------------------------------------- Vitals Details Patient Name: [name] Date of Service: 07/07/2016 2:15 PM Medical Record Number: [id number] Patient Account Number: [id number] Date of Birth/Sex: [birth year] (60 y.o. Male) Treating RN: Montey Hora Primary Care Physician: [name] Other Clinician: Referring Physician: [name] Treating Physician/Extender: Frann Rider in Treatment: 3 Vital Signs Time Taken: 14:15 Temperature (F): 98.2 Height (in): 68 Pulse (bpm): 85 Weight (lbs): 202 Respiratory Rate (breaths/min): 18 Body Mass Index (BMI): 30.7 Blood Pressure (mmHg): 141/74 Reference Range: 80 - 120 mg / dl Electronic Signature(s) Signed: 07/07/2016 5:14:32 PM By: Montey Hora Entered By: Montey Hora on 07/07/2016 14:15:38

## 2016-07-09 DIAGNOSIS — L97919 Non-pressure chronic ulcer of unspecified part of right lower leg with unspecified severity: Secondary | ICD-10-CM | POA: Diagnosis not present

## 2016-07-14 ENCOUNTER — Encounter: Payer: Medicare Other | Attending: Surgery | Admitting: Surgery

## 2016-07-14 DIAGNOSIS — E1140 Type 2 diabetes mellitus with diabetic neuropathy, unspecified: Secondary | ICD-10-CM | POA: Insufficient documentation

## 2016-07-14 DIAGNOSIS — X58XXXA Exposure to other specified factors, initial encounter: Secondary | ICD-10-CM | POA: Diagnosis not present

## 2016-07-14 DIAGNOSIS — F17218 Nicotine dependence, cigarettes, with other nicotine-induced disorders: Secondary | ICD-10-CM | POA: Insufficient documentation

## 2016-07-14 DIAGNOSIS — Z794 Long term (current) use of insulin: Secondary | ICD-10-CM | POA: Diagnosis not present

## 2016-07-14 DIAGNOSIS — E11622 Type 2 diabetes mellitus with other skin ulcer: Secondary | ICD-10-CM | POA: Diagnosis not present

## 2016-07-14 DIAGNOSIS — S81812A Laceration without foreign body, left lower leg, initial encounter: Secondary | ICD-10-CM | POA: Insufficient documentation

## 2016-07-14 DIAGNOSIS — I10 Essential (primary) hypertension: Secondary | ICD-10-CM | POA: Insufficient documentation

## 2016-07-14 DIAGNOSIS — E11621 Type 2 diabetes mellitus with foot ulcer: Secondary | ICD-10-CM | POA: Diagnosis not present

## 2016-07-14 DIAGNOSIS — S81811A Laceration without foreign body, right lower leg, initial encounter: Secondary | ICD-10-CM | POA: Insufficient documentation

## 2016-07-14 DIAGNOSIS — Z8551 Personal history of malignant neoplasm of bladder: Secondary | ICD-10-CM | POA: Diagnosis not present

## 2016-07-14 DIAGNOSIS — Z85828 Personal history of other malignant neoplasm of skin: Secondary | ICD-10-CM | POA: Insufficient documentation

## 2016-07-14 DIAGNOSIS — L97812 Non-pressure chronic ulcer of other part of right lower leg with fat layer exposed: Secondary | ICD-10-CM | POA: Diagnosis not present

## 2016-07-15 NOTE — Progress Notes (Signed)
[name] ([id number]) Visit Report for 07/14/2016 Arrival Information Details Patient Name: [name], [name] Date of Service: 07/14/2016 3:45 PM Medical Record Number: [id number] Patient Account Number: [id number] Date of Birth/Sex: [birth year] (60 y.o. Male) Treating RN: Montey Hora Primary Care Physician: [name] Other Clinician: Referring Physician: [name] Treating Physician/Extender: Frann Rider in Treatment: 4 Visit Information History Since Last Visit Added or deleted any medications: No Patient Arrived: Ambulatory Any new allergies or adverse reactions: No Arrival Time: 16:00 Had a fall or experienced change in No Accompanied By: spouse activities of daily living that may affect Transfer Assistance: None risk of falls: Patient Identification Verified: Yes Signs or symptoms of abuse/neglect since last No Secondary Verification Process Yes visito Completed: Hospitalized since last visit: No Patient Has Alerts: Yes Pain Present Now: No Patient Alerts: Patient on Blood Thinner DM Type II Electronic Signature(s) Signed: 07/14/2016 5:32:55 PM By: Montey Hora Entered By: Montey Hora on 07/14/2016 16:02:19 [name] ([id number]) -------------------------------------------------------------------------------- Encounter Discharge Information Details Patient Name: [name] Date of Service: 07/14/2016 3:45 PM Medical Record Number: [id number] Patient Account Number: [id number] Date of Birth/Sex: 1956-07-31 (60 y.o. Male) Treating RN: Montey Hora Primary Care Physician: [name] Other Clinician: Referring Physician: [name] Treating Physician/Extender: Frann Rider in Treatment: 4 Encounter Discharge Information Items Discharge Pain Level: 0 Discharge Condition: Stable Ambulatory Status: Ambulatory Discharge Destination: Home Transportation: Private Auto Accompanied By: spouse Schedule Follow-up Appointment:  Yes Medication Reconciliation completed and provided to Patient/Care No [name]: Provided on Clinical Summary of Care: 07/14/2016 Form Type Recipient Paper Patient TW Electronic Signature(s) Signed: 07/14/2016 4:35:35 PM By: Ruthine Dose Entered By: Ruthine Dose on 07/14/2016 16:35:35 [name], [name] ([id number]) -------------------------------------------------------------------------------- Multi Wound Chart Details Patient Name: [name] Date of Service: 07/14/2016 3:45 PM Medical Record Number: [id number] Patient Account Number: [id number] Date of Birth/Sex: [birth year] (60 y.o. Male) Treating RN: Montey Hora Primary Care Physician: [name] Other Clinician: Referring Physician: [name] Treating Physician/Extender: Frann Rider in Treatment: 4 Vital Signs Height(in): 68 Pulse(bpm): 81 Weight(lbs): 202 Blood Pressure 146/72 (mmHg): Body Mass Index(BMI): 31 Temperature(F): 97.8 Respiratory Rate 18 (breaths/min): Photos: [N/A:N/A] Wound Location: Right Lower Leg Left Lower Leg - Medial N/A Wounding Event: Trauma Trauma N/A Primary Etiology: Diabetic Wound/Ulcer of Diabetic Wound/Ulcer of N/A the Lower Extremity the Lower Extremity Secondary Etiology: Trauma, Other Trauma, Other N/A Comorbid History: Cataracts, Deep Vein Cataracts, Deep Vein N/A Thrombosis, Thrombosis, Hypertension, Type II Hypertension, Type II Diabetes, Neuropathy Diabetes, Neuropathy Date Acquired: 06/05/2016 06/05/2016 N/A Weeks of Treatment: 4 4 N/A Wound Status: Open Healed - Epithelialized N/A Measurements L x W x D 9.8x1x0.2 0x0x0 N/A (cm) Area (cm) : 7.697 0 N/A Volume (cm) : 1.539 0 N/A % Reduction in Area: 24.60% 100.00% N/A % Reduction in Volume: -50.70% 100.00% N/A Classification: Grade 1 Grade 1 N/A Exudate Amount: Large None Present N/A Exudate Type: Sanguinous N/A N/A Exudate Color: red N/A N/A Wound Margin: Flat and Intact Flat and Intact  N/A [name], [name]. ([id number]) Granulation Amount: Medium (34-66%) None Present (0%) N/A Granulation Quality: Pink N/A N/A Necrotic Amount: Medium (34-66%) None Present (0%) N/A Necrotic Tissue: Eschar, Adherent Slough N/A N/A Exposed Structures: Fascia: No Fascia: No N/A Fat: No Fat: No Tendon: No Tendon: No Muscle: No Muscle: No Joint: No Joint: No Bone: No Bone: No Limited to Skin Limited to Skin Breakdown Breakdown Epithelialization: None Large (67-100%) N/A Periwound Skin Texture: Edema: Yes Scarring: Yes N/A Induration: Yes Edema: No  Scarring: Yes Excoriation: No Excoriation: No Induration: No Callus: No Callus: No Crepitus: No Crepitus: No Fluctuance: No Fluctuance: No Friable: No Friable: No Rash: No Rash: No Periwound Skin Maceration: No Moist: Yes N/A Moisture: Moist: No Maceration: No Dry/Scaly: No Dry/Scaly: No Periwound Skin Color: Atrophie Blanche: No Ecchymosis: Yes N/A Cyanosis: No Atrophie Blanche: No Ecchymosis: No Cyanosis: No Erythema: No Erythema: No Hemosiderin Staining: No Hemosiderin Staining: No Mottled: No Mottled: No Pallor: No Pallor: No Rubor: No Rubor: No Temperature: No Abnormality No Abnormality N/A Tenderness on Yes Yes N/A Palpation: Wound Preparation: Ulcer Cleansing: Ulcer Cleansing: N/A Rinsed/Irrigated with Rinsed/Irrigated with Saline Saline Topical Anesthetic Topical Anesthetic Applied: Other: lidocaine Applied: None 4% Treatment Notes Electronic Signature(s) Signed: 07/14/2016 5:32:55 PM By: Montey Hora Entered By: Montey Hora on 07/14/2016 16:19:16 [name], [name] ([id number]) [name], [name] ([id number]) -------------------------------------------------------------------------------- [place] Details Patient Name: [name] Date of Service: 07/14/2016 3:45 PM Medical Record Number: [id number] Patient Account Number: [id number] Date of Birth/Sex: 06/22/1956 (60 y.o.  Male) Treating RN: Montey Hora Primary Care Physician: [name] Other Clinician: Referring Physician: [name] Treating Physician/Extender: Frann Rider in Treatment: 4 Active Inactive Necrotic Tissue Nursing Diagnoses: Impaired tissue integrity related to necrotic/devitalized tissue Knowledge deficit related to management of necrotic/devitalized tissue Goals: Necrotic/devitalized tissue will be minimized in the wound bed Date Initiated: 06/23/2016 Goal Status: Active Patient/caregiver will verbalize understanding of reason and process for debridement of necrotic tissue Date Initiated: 06/23/2016 Goal Status: Active Interventions: Assess patient pain level pre-, during and post procedure and prior to discharge Provide education on necrotic tissue and debridement process Treatment Activities: Apply topical anesthetic as ordered : 06/23/2016 Notes: Nutrition Nursing Diagnoses: Impaired glucose control: actual or potential Goals: Patient/caregiver agrees to and verbalizes understanding of need to obtain nutritional consultation Date Initiated: 06/16/2016 Goal Status: Active Interventions: Assess HgA1c results as ordered upon admission and as needed Notes: [name], [name] ([id number]) Orientation to the Wound Care Program Nursing Diagnoses: Knowledge deficit related to the wound healing center program Goals: Patient/caregiver will verbalize understanding of the [facility] Program Date Initiated: 06/16/2016 Goal Status: Active Interventions: Provide education on orientation to the wound center Notes: Wound/Skin Impairment Nursing Diagnoses: Impaired tissue integrity Goals: Ulcer/skin breakdown will heal within 14 weeks Date Initiated: 06/16/2016 Goal Status: Active Interventions: Assess patient/caregiver ability to obtain necessary supplies Treatment Activities: Refer to smoking cessation program : 06/16/2016 Notes: Electronic  Signature(s) Signed: 07/14/2016 5:32:55 PM By: Montey Hora Entered By: Montey Hora on 07/14/2016 16:19:07 [name] ([id number]) -------------------------------------------------------------------------------- Pain Assessment Details Patient Name: [name] Date of Service: 07/14/2016 3:45 PM Medical Record Number: [id number] Patient Account Number: [id number] Date of Birth/Sex: [birth year] (60 y.o. Male) Treating RN: Montey Hora Primary Care Physician: [name] Other Clinician: Referring Physician: [name] Treating Physician/Extender: Frann Rider in Treatment: 4 Active Problems Location of Pain Severity and Description of Pain Patient Has Paino Yes Site Locations Pain Location: Pain in Ulcers With Dressing Change: No Duration of the Pain. Constant / Intermittento Intermittent Character of Pain Describe the Pain: Other: stinging feeling Pain Management and Medication Current Pain Management: Notes Topical or injectable lidocaine is offered to patient for acute pain when surgical debridement is performed. If needed, Patient is instructed to use over the counter pain medication for the following 24-48 hours after debridement. Wound care MDs do not prescribed pain medications. Patient has chronic pain or uncontrolled pain. Patient has been instructed to make an appointment with their Primary  Care Physician for pain management. Electronic Signature(s) Signed: 07/14/2016 5:32:55 PM By: Montey Hora Entered By: Montey Hora on 07/14/2016 16:03:29 [name] ([id number]) -------------------------------------------------------------------------------- Patient/Caregiver Education Details Patient Name: [name] Date of Service: 07/14/2016 3:45 PM Medical Record Number: [id number] Patient Account Number: [id number] Date of Birth/Gender: 1955/10/17 (60 y.o. Male) Treating RN: Montey Hora Primary Care Physician: [name] Other  Clinician: Referring Physician: [name] Treating Physician/Extender: Frann Rider in Treatment: 4 Education Assessment Education Provided To: Patient and Caregiver Education Topics Provided Wound/Skin Impairment: Handouts: Other: wound care as ordered Methods: Demonstration, Explain/Verbal Responses: State content correctly Electronic Signature(s) Signed: 07/14/2016 5:32:55 PM By: Montey Hora Entered By: Montey Hora on 07/14/2016 16:33:26 [name], [name] ([id number]) -------------------------------------------------------------------------------- Wound Assessment Details Patient Name: [name] Date of Service: 07/14/2016 3:45 PM Medical Record Number: [id number] Patient Account Number: [id number] Date of Birth/Sex: 09-16-1955 (60 y.o. Male) Treating RN: Montey Hora Primary Care Physician: [name] Other Clinician: Referring Physician: [name] Treating Physician/Extender: Frann Rider in Treatment: 4 Wound Status Wound Number: 1 Primary Diabetic Wound/Ulcer of the Lower Etiology: Extremity Wound Location: Right Lower Leg Secondary Trauma, Other Wounding Event: Trauma Etiology: Date Acquired: 06/05/2016 Wound Open Weeks Of Treatment: 4 Status: Clustered Wound: No Comorbid Cataracts, Deep Vein Thrombosis, History: Hypertension, Type II Diabetes, Neuropathy Photos Wound Measurements Length: (cm) 9.8 Width: (cm) 1 Depth: (cm) 0.2 Area: (cm) 7.697 Volume: (cm) 1.539 % Reduction in Area: 24.6% % Reduction in Volume: -50.7% Epithelialization: None Tunneling: No Undermining: No Wound Description Classification: Grade 1 Wound Margin: Flat and Intact Exudate Amount: Large Exudate Type: Sanguinous Exudate Color: red Foul Odor After Cleansing: No Wound Bed Granulation Amount: Medium (34-66%) Exposed Structure Granulation Quality: Pink Fascia Exposed: No Necrotic Amount: Medium (34-66%) Fat Layer Exposed: No [name], [name] ([id number]) Necrotic Quality: Eschar, Adherent Slough Tendon Exposed: No Muscle Exposed: No Joint Exposed: No Bone Exposed: No Limited to Skin Breakdown Periwound Skin Texture Texture Color No Abnormalities Noted: No No Abnormalities Noted: No Callus: No Atrophie Blanche: No Crepitus: No Cyanosis: No Excoriation: No Ecchymosis: No Fluctuance: No Erythema: No Friable: No Hemosiderin Staining: No Induration: Yes Mottled: No Localized Edema: Yes Pallor: No Rash: No Rubor: No Scarring: Yes Temperature / Pain Moisture Temperature: No Abnormality No Abnormalities Noted: No Tenderness on Palpation: Yes Dry / Scaly: No Maceration: No Moist: No Wound Preparation Ulcer Cleansing: Rinsed/Irrigated with Saline Topical Anesthetic Applied: Other: lidocaine 4%, Treatment Notes Wound #1 (Right Lower Leg) 1. Cleansed with: Clean wound with Normal Saline 2. Anesthetic Topical Lidocaine 4% cream to wound bed prior to debridement 4. Dressing Applied: Santyl Ointment 5. Secondary Dressing Applied Kerlix/Conform Non-Adherent pad 7. Secured with Tape Notes BFD over area of wound that has steri strips on it after staples removed, netting Electronic Signature(s) Signed: 07/14/2016 5:32:55 PM By: Celene Kras ([id number]) Entered By: Montey Hora on 07/14/2016 16:17:51 Guardiola, [name] ([id number]) -------------------------------------------------------------------------------- Wound Assessment Details Patient Name: [name] Date of Service: 07/14/2016 3:45 PM Medical Record Number: [id number] Patient Account Number: [id number] Date of Birth/Sex: 1956-05-03 (60 y.o. Male) Treating RN: Montey Hora Primary Care Physician: [name] Other Clinician: Referring Physician: [name] Treating Physician/Extender: Frann Rider in Treatment: 4 Wound Status Wound Number: 2 Primary Diabetic Wound/Ulcer of the Lower Etiology:  Extremity Wound Location: Left Lower Leg - Medial Secondary Trauma, Other Wounding Event: Trauma Etiology: Date Acquired: 06/05/2016 Wound Healed - Epithelialized Weeks Of Treatment: 4 Status: Clustered Wound: No Comorbid Cataracts,  Deep Vein Thrombosis, History: Hypertension, Type II Diabetes, Neuropathy Photos Wound Measurements Length: (cm) 0 % Reduction in Width: (cm) 0 % Reduction in Depth: (cm) 0 Epithelializat Area: (cm) 0 Tunneling: Volume: (cm) 0 Undermining: Area: 100% Volume: 100% ion: Large (67-100%) No No Wound Description Classification: Grade 1 Wound Margin: Flat and Intact Exudate Amount: None Present Foul Odor After Cleansing: No Wound Bed Granulation Amount: None Present (0%) Exposed Structure Necrotic Amount: None Present (0%) Fascia Exposed: No Fat Layer Exposed: No Tendon Exposed: No Muscle Exposed: No [name], [name] ([id number]) Joint Exposed: No Bone Exposed: No Limited to Skin Breakdown Periwound Skin Texture Texture Color No Abnormalities Noted: No No Abnormalities Noted: No Callus: No Atrophie Blanche: No Crepitus: No Cyanosis: No Excoriation: No Ecchymosis: Yes Fluctuance: No Erythema: No Friable: No Hemosiderin Staining: No Induration: No Mottled: No Localized Edema: No Pallor: No Rash: No Rubor: No Scarring: Yes Temperature / Pain Moisture Temperature: No Abnormality No Abnormalities Noted: No Tenderness on Palpation: Yes Dry / Scaly: No Maceration: No Moist: Yes Wound Preparation Ulcer Cleansing: Rinsed/Irrigated with Saline Topical Anesthetic Applied: None Electronic Signature(s) Signed: 07/14/2016 5:32:55 PM By: Montey Hora Entered By: Montey Hora on 07/14/2016 16:16:34 Alcantar, [name] ([id number]) -------------------------------------------------------------------------------- [place] Details Patient Name: [name] Date of Service: 07/14/2016 3:45 PM Medical Record Number: [id number] Patient  Account Number: [id number] Date of Birth/Sex: [birth year] (60 y.o. Male) Treating RN: Montey Hora Primary Care Physician: [name] Other Clinician: Referring Physician: [name] Treating Physician/Extender: Frann Rider in Treatment: 4 Vital Signs Time Taken: 16:04 Temperature (F): 97.8 Height (in): 68 Pulse (bpm): 81 Weight (lbs): 202 Respiratory Rate (breaths/min): 18 Body Mass Index (BMI): 30.7 Blood Pressure (mmHg): 146/72 Reference Range: 80 - 120 mg / dl Electronic Signature(s) Signed: 07/14/2016 5:32:55 PM By: Montey Hora Entered By: Montey Hora on 07/14/2016 16:03:56

## 2016-07-15 NOTE — Progress Notes (Signed)
CARPENTER, PANNIER ([id number]) Visit Report for 07/14/2016 Chief Complaint Document Details Patient Name: [name], [name] Date of Service: 07/14/2016 3:45 PM Medical Record Number: [id number] Patient Account Number: [id number] Date of Birth/Sex: Jun 07, 1956 (60 y.o. Male) Treating RN: [name] Primary Care Physician: [name] Other Clinician: Referring Physician: Renato [name] Treating Physician/Extender: [name] in Treatment: 4 Information Obtained from: Patient Chief Complaint Patients presents for treatment of an open diabetic ulcer and a recent laceration both lower extremities which happened on 06/05/2016 Electronic Signature(s) Signed: 07/14/2016 4:23:52 PM By: Paul Fudge MD, FACS Entered By: [name] on 07/14/2016 16:23:51 [name] ([id number]) -------------------------------------------------------------------------------- Debridement Details Patient Name: [name] Date of Service: 07/14/2016 3:45 PM Medical Record Number: [id number] Patient Account Number: [id number] Date of Birth/Sex: Nov 21, 1955 (60 y.o. Male) Treating RN: [name] Primary Care Physician: [name] Other Clinician: Referring Physician: Renato [name] Treating Physician/Extender: [name] in Treatment: 4 Debridement Performed for Wound #1 Right Lower Leg Assessment: Performed By: Physician Paul Fudge, MD Debridement: Debridement Pre-procedure Yes - 16:12 Verification/Time Out Taken: Start Time: 16:12 Pain Control: Lidocaine 4% Topical Solution Level: Skin/Subcutaneous Tissue Total Area Debrided (L x 2 (cm) x 1 (cm) = 2 (cm) W): Tissue and other Viable, Non-Viable, Eschar, Fibrin/Slough, Subcutaneous material debrided: Instrument: Forceps, Scissors Bleeding: Minimum Hemostasis Achieved: Pressure End Time: 16:15 Procedural Pain: 0 Post Procedural Pain: 0 Response to Treatment: Procedure was tolerated well Post Debridement  Measurements of Total Wound Length: (cm) 9.8 Width: (cm) 1 Depth: (cm) 0.2 Volume: (cm) 1.539 Character of Wound/Ulcer Post Improved Debridement: Severity of Tissue Post Debridement: Fat layer exposed Post Procedure Diagnosis Same as Pre-procedure Electronic Signature(s) Signed: 07/14/2016 4:23:45 PM By: Paul Fudge MD, FACS Signed: 07/14/2016 5:32:55 PM By: [name] Entered By: [name] on 07/14/2016 16:23:45 [name], [name] ([name] ([id number]) -------------------------------------------------------------------------------- HPI Details Patient Name: [name] Date of Service: 07/14/2016 3:45 PM Medical Record Number: [id number] Patient Account Number: [id number] Date of Birth/Sex: July 12, 1956 (60 y.o. Male) Treating RN: [name] Primary Care Physician: [name] Other Clinician: Referring Physician: Renato [name] Treating Physician/Extender: [name] in Treatment: 4 History of Present Illness Location: bilateral lower extremity lacerations with staples intact on the right lower extremity Quality: Patient reports experiencing a dull pain to affected area(s). Severity: Patient states wound (s) are getting better. Duration: Patient has had the wound for < 2 weeks prior to presenting for treatment Timing: Pain in wound is Intermittent (comes and goes Context: The wound occurred when the patient had lacerations with a metal sheet which had to be repaired in the ER Modifying Factors: Other treatment(s) tried include:as noted he has staples on the right lower extremity and has been on 2 courses of antibiotics Associated Signs and Symptoms: Patient reports having increase swelling. HPI Description: 60 year old gentleman who is a patient of Dr. Nash [name] and was seen by him recently on November 1 for a history of having scraped both legs with a piece of metal and was seen in the ER and the right leg was closed with staples.  He was placed on Keflex and. Dr. Loanne [name] treated his diabetes appropriately and also prescribed doxycycline to be taken. 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 he is status post axillary hidradenitis excision, cataract extraction,laser ablation of condyloma, hemorrhoid surgery, incision and drainage of  abscess, transurethral resection of bladder tumor. he smokes a pack of cigarettes every day. the patient has been seen by Dr. Fletcher [name] in the past and he has had a workup done which was remote in 2015 which showed the right side showed a ABI of 0.44 and the left was normal. 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 No intervention was done and he was put on Cilostazol. Not find any electronic records of recent tests done since August 2015. 06/23/2016 -- the patient continues to smoke half a pack of cigarettes a day and is working on giving up completely. 07/07/2016 -- he is using medihoney instead of Santyl as it was not affordable. Electronic Signature(s) Signed: 07/14/2016 4:24:00 PM By: Paul Fudge MD, FACS Entered By: [name] on 07/14/2016 16:24:00 [name] ([id number]) -------------------------------------------------------------------------------- Physical Exam Details Patient Name: [name] Date of Service: 07/14/2016 3:45 PM Medical Record Number: [id number] Patient Account Number: [id number] Date of Birth/Sex: [birth year] (60 y.o. Male) Treating RN: [name] Primary Care Physician: [name] Other Clinician: Referring Physician: Renato [name] Treating Physician/Extender: [name] in Treatment:  4 Constitutional . Pulse regular. Respirations normal and unlabored. Afebrile. . Eyes Nonicteric. Reactive to light. Ears, Nose, Mouth, and Throat Lips, teeth, and gums WNL.Marland Kitchen Moist mucosa without lesions. Neck supple and nontender. No palpable supraclavicular or cervical adenopathy. Normal sized without goiter. Respiratory WNL. No retractions.. Cardiovascular Heart rhythm and rate regular, no murmur or gallop.. Pedal Pulses WNL. No clubbing, cyanosis or edema. Lymphatic No adneopathy. No adenopathy. No adenopathy. Musculoskeletal Adexa without tenderness or enlargement.. Digits and nails w/o clubbing, cyanosis, infection, petechiae, ischemia, or inflammatory conditions.. Integumentary (Hair, Skin) No suspicious lesions. No crepitus or fluctuance. No peri-wound warmth or erythema. No masses.Marland Kitchen Psychiatric Judgement and insight Intact.. No evidence of depression, anxiety, or agitation.. Notes the low part of the wound continues to have subcutaneous tibia. I have sharply debrided this with forceps and scissors and bleeding controlled with pressure. The upper part is nicely healed and no Steri-Strips are required at this stage. Electronic Signature(s) Signed: 07/14/2016 4:24:39 PM By: Paul Fudge MD, FACS Entered By: [name] on 07/14/2016 16:24:39 [name] ([id number]) -------------------------------------------------------------------------------- Physician Orders Details Patient Name: [name] Date of Service: 07/14/2016 3:45 PM Medical Record Number: [id number] Patient Account Number: [id number] Date of Birth/Sex: 1956-05-20 (60 y.o. Male) Treating RN: [name] Primary Care Physician: [name] Other Clinician: Referring Physician: Renato [name] Treating Physician/Extender: [name] in Treatment: 4 Verbal / Phone Orders: Yes Clinician: Montey [name] Read Back and Verified: Yes Diagnosis Coding Wound Cleansing Wound #1 Right Lower  Leg o Clean wound with Normal Saline. o May shower with protection. Anesthetic Wound #1 Right Lower Leg o Topical Lidocaine 4% cream applied to wound bed prior to debridement - in office only Primary Wound Dressing Wound #1 Right Lower Leg o Santyl Ointment - santyl in wound clinic o Medihoney gel Secondary Dressing Wound #1 Right Lower Leg o Conform/Kerlix o Non-adherent pad o Other - bordered foam dressing over steri strips to be kept on all week Dressing Change Frequency Wound #1 Right Lower Leg o Change dressing every day. Follow-up Appointments Wound #1 Right Lower Leg o Return Appointment in 1 week. Edema Control Wound #1 Right Lower Leg o Elevate legs to the level of the heart and pump ankles as often as possible  Additional Orders / Instructions Wound #1 Right Lower Leg o Stop Smoking [name], MINCY. ([id number]) o Activity as tolerated Medications-please add to medication list. Wound #1 Right Lower Leg o Other: - Medihoney Electronic Signature(s) Signed: 07/14/2016 4:26:33 PM By: Paul Fudge MD, FACS Signed: 07/14/2016 5:32:55 PM By: [name] Entered By: [name] on 07/14/2016 16:21:26 [name] ([id number]) -------------------------------------------------------------------------------- Problem List Details Patient Name: [name] Date of Service: 07/14/2016 3:45 PM Medical Record Number: [id number] Patient Account Number: [id number] Date of Birth/Sex: [birth year] (60 y.o. Male) Treating RN: [name] Primary Care Physician: [name] Other Clinician: Referring Physician: Renato [name] Treating Physician/Extender: [name] in Treatment: 4 Active Problems ICD-10 Encounter Code Description Active Date Diagnosis E11.621 Type 2 diabetes mellitus with foot ulcer 06/16/2016 Yes S81.811A Laceration without foreign body, right lower leg, initial 06/16/2016 Yes encounter [id number] Laceration  without foreign body, left lower leg, initial 06/16/2016 Yes encounter F17.218 Nicotine dependence, cigarettes, with other nicotine- 06/16/2016 Yes induced disorders Inactive Problems Resolved Problems Electronic Signature(s) Signed: 07/14/2016 4:23:25 PM By: Paul Fudge MD, FACS Entered By: [name] on 07/14/2016 16:23:24 [name] ([id number]) -------------------------------------------------------------------------------- Progress Note Details Patient Name: [name] Date of Service: 07/14/2016 3:45 PM Medical Record Number: [id number] Patient Account Number: [id number] Date of Birth/Sex: May 08, 1956 (60 y.o. Male) Treating RN: [name] Primary Care Physician: [name] Other Clinician: Referring Physician: Renato [name] Treating Physician/Extender: [name] in Treatment: 4 Subjective Chief Complaint Information obtained from Patient Patients presents for treatment of an open diabetic ulcer and a recent laceration both lower extremities which happened on 06/05/2016 History of Present Illness (HPI) The following HPI elements were documented for the patient's wound: Location: bilateral lower extremity lacerations with staples intact on the right lower extremity Quality: Patient reports experiencing a dull pain to affected area(s). Severity: Patient states wound (s) are getting better. Duration: Patient has had the wound for < 2 weeks prior to presenting for treatment Timing: Pain in wound is Intermittent (comes and goes Context: The wound occurred when the patient had lacerations with a metal sheet which had to be repaired in the ER Modifying Factors: Other treatment(s) tried include:as noted he has staples on the right lower extremity and has been on 2 courses of antibiotics Associated Signs and Symptoms: Patient reports having increase swelling. 60 year old gentleman who is a patient of Dr. Nash [name] and was seen by him recently on  November 1 for a history of having scraped both legs with a piece of metal and was seen in the ER and the right leg was closed with staples. He was placed on Keflex and. Dr. Loanne [name] treated his diabetes appropriately and also prescribed doxycycline to be taken. 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 he is status post axillary hidradenitis excision, cataract extraction,laser ablation of condyloma, hemorrhoid surgery, incision and drainage of abscess, transurethral resection of bladder tumor. he smokes a pack of cigarettes every day. the patient has been seen by Dr. Fletcher [name] in the past and he has had a workup done which was remote in 2015 which showed the right side showed a ABI of 0.44 and the left was normal. 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 No intervention  was done and he was put on Cilostazol. Not find any electronic records of recent tests done since August 2015. 06/23/2016 -- the patient continues to smoke half a pack of cigarettes a day and is working on giving up completely. 07/07/2016 -- he is using medihoney instead of Santyl as it was not affordable. [name], [name] ([id number]) Objective Constitutional Pulse regular. Respirations normal and unlabored. Afebrile. Vitals Time Taken: 4:04 PM, Height: 68 in, Weight: 202 lbs, BMI: 30.7, Temperature: 97.8 F, Pulse: 81 bpm, Respiratory Rate: 18 breaths/min, Blood Pressure: 146/72 mmHg. Eyes Nonicteric. Reactive to light. Ears, Nose, Mouth, and Throat Lips, teeth, and gums WNL.Marland Kitchen Moist mucosa without lesions. Neck supple and nontender. No palpable supraclavicular or cervical adenopathy.  Normal sized without goiter. Respiratory WNL. No retractions.. Cardiovascular Heart rhythm and rate regular, no murmur or gallop.. Pedal Pulses WNL. No clubbing, cyanosis or edema. Lymphatic No adneopathy. No adenopathy. No adenopathy. Musculoskeletal Adexa without tenderness or enlargement.. Digits and nails w/o clubbing, cyanosis, infection, petechiae, ischemia, or inflammatory conditions.Marland Kitchen Psychiatric Judgement and insight Intact.. No evidence of depression, anxiety, or agitation.. General Notes: the low part of the wound continues to have subcutaneous tibia. I have sharply debrided this with forceps and scissors and bleeding controlled with pressure. The upper part is nicely healed and no Steri-Strips are required at this stage. Integumentary (Hair, Skin) No suspicious lesions. No crepitus or fluctuance. No peri-wound warmth or erythema. No masses.. Wound #1 status is Open. Original cause of wound was Trauma. The wound is located on the Right Lower Leg. The wound measures 9.8cm length x 1cm width x 0.2cm depth; 7.697cm^2 area and 1.539cm^3 [name], [name]. ([id number]) volume. The wound is limited to skin breakdown. There is no tunneling or undermining noted. There is a large amount of sanguinous drainage noted. The wound margin is flat and intact. There is medium (34- 66%) pink granulation within the wound bed. There is a medium (34-66%) amount of necrotic tissue within the wound bed including Eschar and Adherent Slough. The periwound skin appearance exhibited: Induration, Localized Edema, Scarring. The periwound skin appearance did not exhibit: Callus, Crepitus, Excoriation, Fluctuance, Friable, Rash, Dry/Scaly, Maceration, Moist, Atrophie Blanche, Cyanosis, Ecchymosis, Hemosiderin Staining, Mottled, Pallor, Rubor, Erythema. Periwound temperature was noted as No Abnormality. The periwound has tenderness on palpation. Wound #2 status is Healed - Epithelialized. Original cause of wound  was Trauma. The wound is located on the Left,Medial Lower Leg. The wound measures 0cm length x 0cm width x 0cm depth; 0cm^2 area and 0cm^3 volume. The wound is limited to skin breakdown. There is no tunneling or undermining noted. There is a none present amount of drainage noted. The wound margin is flat and intact. There is no granulation within the wound bed. There is no necrotic tissue within the wound bed. The periwound skin appearance exhibited: Scarring, Moist, Ecchymosis. The periwound skin appearance did not exhibit: Callus, Crepitus, Excoriation, Fluctuance, Friable, Induration, Localized Edema, Rash, Dry/Scaly, Maceration, Atrophie Blanche, Cyanosis, Hemosiderin Staining, Mottled, Pallor, Rubor, Erythema. Periwound temperature was noted as No Abnormality. The periwound has tenderness on palpation. Assessment Active Problems ICD-10 E11.621 - Type 2 diabetes mellitus with foot ulcer S81.811A - Laceration without foreign body, right lower leg, initial encounter S81.812A - Laceration without foreign body, left lower leg, initial encounter F17.218 - Nicotine dependence, cigarettes, with other nicotine-induced disorders Procedures Wound #1 Wound #1 is a Diabetic Wound/Ulcer of the Lower Extremity located on the Right Lower Leg . There was a Skin/Subcutaneous Tissue Debridement [id number]) debridement with  total area of 2 sq cm performed by Paul Fudge, MD. with the following instrument(s): Forceps and Scissors to remove Viable and Non- Viable tissue/material including Fibrin/Slough, Eschar, and Subcutaneous after achieving pain control using Lidocaine 4% Topical Solution. A time out was conducted at 16:12, prior to the start of the procedure. A Minimum amount of bleeding was controlled with Pressure. The procedure was tolerated well with a pain level of 0 throughout and a pain level of 0 following the procedure. Post Debridement Measurements: 9.8cm length x 1cm width x 0.2cm depth;  1.539cm^3 volume. Character of Wound/Ulcer Post Debridement is improved. Severity of Tissue Post Debridement is: Fat layer exposed. [name], [name] ([id number]) Post procedure Diagnosis Wound #1: Same as Pre-Procedure Plan Wound Cleansing: Wound #1 Right Lower Leg: Clean wound with Normal Saline. May shower with protection. Anesthetic: Wound #1 Right Lower Leg: Topical Lidocaine 4% cream applied to wound bed prior to debridement - in office only Primary Wound Dressing: Wound #1 Right Lower Leg: Santyl Ointment - santyl in wound clinic Medihoney gel Secondary Dressing: Wound #1 Right Lower Leg: Conform/Kerlix Non-adherent pad Other - bordered foam dressing over steri strips to be kept on all week Dressing Change Frequency: Wound #1 Right Lower Leg: Change dressing every day. Follow-up Appointments: Wound #1 Right Lower Leg: Return Appointment in 1 week. Edema Control: Wound #1 Right Lower Leg: Elevate legs to the level of the heart and pump ankles as often as possible Additional Orders / Instructions: Wound #1 Right Lower Leg: Stop Smoking Activity as tolerated Medications-please add to medication list.: Wound #1 Right Lower Leg: Other: - Medihoney For the right lower extremity besides application of Steri-Strips on the superior part of the wound the lower part needs Medihoney get rid of the subcutaneous differed he as Santyl was not affordable. [name], LAMPSHIRE ([id number]) He will come back and see me next week Electronic Signature(s) Signed: 07/14/2016 4:25:14 PM By: Paul Fudge MD, FACS Entered By: [name] on 07/14/2016 16:25:13 [name] ([id number]) -------------------------------------------------------------------------------- SuperBill Details Patient Name: [name] Date of Service: 07/14/2016 Medical Record Number: [id number] Patient Account Number: [id number] Date of Birth/Sex: 22-Mar-1956 (60 y.o. Male) Treating RN: [name] Primary  Care Physician: [name] Other Clinician: Referring Physician: Renato [name] Treating Physician/Extender: [name] in Treatment: 4 Diagnosis Coding ICD-10 Codes Code Description E11.621 Type 2 diabetes mellitus with foot ulcer S81.811A Laceration without foreign body, right lower leg, initial encounter S81.812A Laceration without foreign body, left lower leg, initial encounter F17.218 Nicotine dependence, cigarettes, with other nicotine-induced disorders Facility Procedures CPT4 Code Description: [id number] - DEB SUBQ TISSUE 20 SQ CM/< ICD-10 Description Diagnosis E11.621 Type 2 diabetes mellitus with foot ulcer S81.811A Laceration without foreign body, right lower leg, initi S81.812A Laceration without foreign body,  left lower leg, initia F17.218 Nicotine dependence, cigarettes, with other nicotine-in Modifier: al encounte l encounter duced disor Quantity: 1 r ders Physician Procedures CPT4 Code Description: [id number] - WC PHYS SUBQ TISS 20 SQ CM ICD-10 Description Diagnosis E11.621 Type 2 diabetes mellitus with foot ulcer S81.811A Laceration without foreign body, right lower leg, initi S81.812A Laceration without foreign body,  left lower leg, initia F17.218 Nicotine dependence, cigarettes, with other nicotine-in Modifier: al encounte l encounter duced disor Quantity: 1 r ders Electronic Signature(s) Signed: 07/14/2016 4:25:24 PM By: Paul Fudge MD, FACS Entered By: [name] on 07/14/2016 16:25:23

## 2016-07-21 ENCOUNTER — Encounter: Payer: Medicare Other | Admitting: Surgery

## 2016-07-21 DIAGNOSIS — S81811A Laceration without foreign body, right lower leg, initial encounter: Secondary | ICD-10-CM | POA: Diagnosis not present

## 2016-07-21 DIAGNOSIS — Z85828 Personal history of other malignant neoplasm of skin: Secondary | ICD-10-CM | POA: Diagnosis not present

## 2016-07-21 DIAGNOSIS — I10 Essential (primary) hypertension: Secondary | ICD-10-CM | POA: Diagnosis not present

## 2016-07-21 DIAGNOSIS — E1140 Type 2 diabetes mellitus with diabetic neuropathy, unspecified: Secondary | ICD-10-CM | POA: Diagnosis not present

## 2016-07-21 DIAGNOSIS — E11622 Type 2 diabetes mellitus with other skin ulcer: Secondary | ICD-10-CM | POA: Diagnosis not present

## 2016-07-21 DIAGNOSIS — E11621 Type 2 diabetes mellitus with foot ulcer: Secondary | ICD-10-CM | POA: Diagnosis not present

## 2016-07-21 DIAGNOSIS — Z794 Long term (current) use of insulin: Secondary | ICD-10-CM | POA: Diagnosis not present

## 2016-07-21 DIAGNOSIS — S81812A Laceration without foreign body, left lower leg, initial encounter: Secondary | ICD-10-CM | POA: Diagnosis not present

## 2016-07-21 DIAGNOSIS — L97812 Non-pressure chronic ulcer of other part of right lower leg with fat layer exposed: Secondary | ICD-10-CM | POA: Diagnosis not present

## 2016-07-21 DIAGNOSIS — Z8551 Personal history of malignant neoplasm of bladder: Secondary | ICD-10-CM | POA: Diagnosis not present

## 2016-07-22 NOTE — Progress Notes (Signed)
SHMAYA, MACEK ([id number]) Visit Report for 07/21/2016 Chief Complaint Document Details Patient Name: [name], [name] Date of Service: 07/21/2016 1:30 PM Medical Record Number: [id number] Patient Account Number: [id number] Date of Birth/Sex: 1956/01/14 (60 y.o. Male) Treating RN: Cornell Barman Primary Care Physician: [name] Other Clinician: Referring Physician: [name] Treating Physician/Extender: Frann Rider in Treatment: 5 Information Obtained from: Patient Chief Complaint Patients presents for treatment of an open diabetic ulcer and a recent laceration both lower extremities which happened on 06/05/2016 Electronic Signature(s) Signed: 07/21/2016 2:14:56 PM By: Christin Fudge MD, FACS Entered By: Christin Fudge on 07/21/2016 14:14:56 [name] ([id number]) -------------------------------------------------------------------------------- Debridement Details Patient Name: [name] Date of Service: 07/21/2016 1:30 PM Medical Record Number: [id number] Patient Account Number: [id number] Date of Birth/Sex: Jul 31, 1956 (60 y.o. Male) Treating RN: Cornell Barman Primary Care Physician: [name] Other Clinician: Referring Physician: [name] Treating Physician/Extender: Frann Rider in Treatment: 5 Debridement Performed for Wound #1 Right Lower Leg Assessment: Performed By: Physician Christin Fudge, MD Debridement: Debridement Pre-procedure Yes - 14:08 Verification/Time Out Taken: Start Time: 14:09 Pain Control: Other : lidocaine 4% Level: Skin/Subcutaneous Tissue Total Area Debrided (L x 4 (cm) x 0.8 (cm) = 3.2 (cm) W): Tissue and other Viable, Non-Viable, Eschar, Fibrin/Slough, Subcutaneous material debrided: Instrument: Curette Bleeding: Minimum Hemostasis Achieved: Pressure End Time: 14:10 Procedural Pain: 0 Post Procedural Pain: 0 Response to Treatment: Procedure was tolerated well Post Debridement Measurements of Total  Wound Length: (cm) 4 Width: (cm) 0.8 Depth: (cm) 0.3 Volume: (cm) 0.754 Character of Wound/Ulcer Post Requires Further Debridement Debridement: Severity of Tissue Post Debridement: Fat layer exposed Post Procedure Diagnosis Same as Pre-procedure Electronic Signature(s) Signed: 07/21/2016 2:14:48 PM By: Christin Fudge MD, FACS Signed: 07/21/2016 6:04:04 PM By: Gretta Cool RN, BSN, Kim RN, BSN Entered By: Christin Fudge on 07/21/2016 14:14:47 [name], [name] ([id number]) [name] ([id number]) -------------------------------------------------------------------------------- HPI Details Patient Name: [name] Date of Service: 07/21/2016 1:30 PM Medical Record Number: [id number] Patient Account Number: [id number] Date of Birth/Sex: Dec 01, 1955 (60 y.o. Male) Treating RN: Cornell Barman Primary Care Physician: [name] Other Clinician: Referring Physician: [name] Treating Physician/Extender: Frann Rider in Treatment: 5 History of Present Illness Location: bilateral lower extremity lacerations with staples intact on the right lower extremity Quality: Patient reports experiencing a dull pain to affected area(s). Severity: Patient states wound (s) are getting better. Duration: Patient has had the wound for < 2 weeks prior to presenting for treatment Timing: Pain in wound is Intermittent (comes and goes Context: The wound occurred when the patient had lacerations with a metal sheet which had to be repaired in the ER Modifying Factors: Other treatment(s) tried include:as noted he has staples on the right lower extremity and has been on 2 courses of antibiotics Associated Signs and Symptoms: Patient reports having increase swelling. HPI Description: 60 year old gentleman who is a patient of Dr. Nash Shearer and was seen by him recently on November 1 for a history of having scraped both legs with a piece of metal and was seen in the ER and the right leg was closed  with staples. He was placed on Keflex and. Dr. Loanne Drilling treated his diabetes appropriately and also prescribed doxycycline to be taken. 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 he is status post axillary hidradenitis excision, cataract extraction,laser ablation of condyloma, hemorrhoid  surgery, incision and drainage of abscess, transurethral resection of bladder tumor. he smokes a pack of cigarettes every day. the patient has been seen by Dr. Fletcher Anon in the past and he has had a workup done which was remote in 2015 which showed the right side showed a ABI of 0.44 and the left was normal. 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 No intervention was done and he was put on Cilostazol. Not find any electronic records of recent tests done since August 2015. 06/23/2016 -- the patient continues to smoke half a pack of cigarettes a day and is working on giving up completely. 07/07/2016 -- he is using medihoney instead of Santyl as it was not affordable. Electronic Signature(s) Signed: 07/21/2016 2:15:04 PM By: Christin Fudge MD, FACS Entered By: Christin Fudge on 07/21/2016 14:15:03 [name] ([id number]) -------------------------------------------------------------------------------- Physical Exam Details Patient Name: [name] Date of Service: 07/21/2016 1:30 PM Medical Record Number: [id number] Patient Account Number: [id number] Date of Birth/Sex: Dec 26, 1955 (60 y.o. Male) Treating RN: Cornell Barman Primary Care Physician: [name] Other Clinician: Referring Physician: [name] Treating Physician/Extender: Frann Rider in Treatment:  5 Constitutional . Pulse regular. Respirations normal and unlabored. Afebrile. . Eyes Nonicteric. Reactive to light. Ears, Nose, Mouth, and Throat Lips, teeth, and gums WNL.Marland Kitchen Moist mucosa without lesions. Neck supple and nontender. No palpable supraclavicular or cervical adenopathy. Normal sized without goiter. Respiratory WNL. No retractions.. Breath sounds WNL, No rubs, rales, rhonchi, or wheeze.. Cardiovascular Heart rhythm and rate regular, no murmur or gallop.. Pedal Pulses WNL. No clubbing, cyanosis or edema. Lymphatic No adneopathy. No adenopathy. No adenopathy. Musculoskeletal Adexa without tenderness or enlargement.. Digits and nails w/o clubbing, cyanosis, infection, petechiae, ischemia, or inflammatory conditions.. Integumentary (Hair, Skin) No suspicious lesions. No crepitus or fluctuance. No peri-wound warmth or erythema. No masses.Marland Kitchen Psychiatric Judgement and insight Intact.. No evidence of depression, anxiety, or agitation.. Notes there is some excoriation around the main wound and this may have been due to trauma. The main wound continues to have some eschar and subcutaneous debris and this was sharply removed with a #3 curet and bleeding controlled with pressure Electronic Signature(s) Signed: 07/21/2016 2:15:33 PM By: Christin Fudge MD, FACS Entered By: Christin Fudge on 07/21/2016 14:15:32 [name], [name] ([id number]) -------------------------------------------------------------------------------- Physician Orders Details Patient Name: [name] Date of Service: 07/21/2016 1:30 PM Medical Record Number: [id number] Patient Account Number: [id number] Date of Birth/Sex: [birth year] (60 y.o. Male) Treating RN: Cornell Barman Primary Care Physician: [name] Other Clinician: Referring Physician: [name] Treating Physician/Extender: Frann Rider in Treatment: 5 Verbal / Phone Orders: Yes Clinician: Cornell Barman Read Back and Verified: Yes Diagnosis  Coding ICD-10 Coding Code Description E11.621 Type 2 diabetes mellitus with foot ulcer S81.811A Laceration without foreign body, right lower leg, initial encounter F17.218 Nicotine dependence, cigarettes, with other nicotine-induced disorders Wound Cleansing Wound #1 Right Lower Leg o Clean wound with Normal Saline. o May shower with protection. Anesthetic Wound #1 Right Lower Leg o Topical Lidocaine 4% cream applied to wound bed prior to debridement - in office only Primary Wound Dressing Wound #1 Right Lower Leg o Medihoney gel Secondary Dressing Wound #1 Right Lower Leg o Conform/Kerlix o Non-adherent pad Dressing Change Frequency Wound #1 Right Lower Leg o Change dressing every day. Follow-up Appointments Wound #1 Right Lower Leg o Return Appointment in 1 week.  Edema Control Wound #1 Right Lower Leg [name], [name]. ([id number]) o Patient to wear own compression stockings o Elevate legs to the level of the heart and pump ankles as often as possible Additional Orders / Instructions Wound #1 Right Lower Leg o Stop Smoking o Activity as tolerated Medications-please add to medication list. Wound #1 Right Lower Leg o Other: - Medihoney Electronic Signature(s) Signed: 07/21/2016 3:08:15 PM By: Christin Fudge MD, FACS Signed: 07/21/2016 6:04:04 PM By: Gretta Cool, RN, BSN, Kim RN, BSN Entered By: Gretta Cool, RN, BSN, Kim on 07/21/2016 14:15:46 [name], [name] ([id number]) -------------------------------------------------------------------------------- Problem List Details Patient Name: [name] Date of Service: 07/21/2016 1:30 PM Medical Record Number: [id number] Patient Account Number: [id number] Date of Birth/Sex: October 31, 1955 (60 y.o. Male) Treating RN: Cornell Barman Primary Care Physician: [name] Other Clinician: Referring Physician: [name] Treating Physician/Extender: Frann Rider in Treatment: 5 Active  Problems ICD-10 Encounter Code Description Active Date Diagnosis E11.621 Type 2 diabetes mellitus with foot ulcer 06/16/2016 Yes S81.811A Laceration without foreign body, right lower leg, initial 06/16/2016 Yes encounter F17.218 Nicotine dependence, cigarettes, with other nicotine- 06/16/2016 Yes induced disorders Inactive Problems Resolved Problems ICD-10 Code Description Active Date Resolved Date S81.812A Laceration without foreign body, left lower leg, initial 06/16/2016 06/16/2016 encounter Electronic Signature(s) Signed: 07/21/2016 2:14:33 PM By: Christin Fudge MD, FACS Entered By: Christin Fudge on 07/21/2016 14:14:32 [name] ([id number]) -------------------------------------------------------------------------------- Progress Note Details Patient Name: [name] Date of Service: 07/21/2016 1:30 PM Medical Record Number: [id number] Patient Account Number: [id number] Date of Birth/Sex: [birth year] (60 y.o. Male) Treating RN: Cornell Barman Primary Care Physician: [name] Other Clinician: Referring Physician: [name] Treating Physician/Extender: Frann Rider in Treatment: 5 Subjective Chief Complaint Information obtained from Patient Patients presents for treatment of an open diabetic ulcer and a recent laceration both lower extremities which happened on 06/05/2016 History of Present Illness (HPI) The following HPI elements were documented for the patient's wound: Location: bilateral lower extremity lacerations with staples intact on the right lower extremity Quality: Patient reports experiencing a dull pain to affected area(s). Severity: Patient states wound (s) are getting better. Duration: Patient has had the wound for < 2 weeks prior to presenting for treatment Timing: Pain in wound is Intermittent (comes and goes Context: The wound occurred when the patient had lacerations with a metal sheet which had to be repaired in the ER Modifying Factors:  Other treatment(s) tried include:as noted he has staples on the right lower extremity and has been on 2 courses of antibiotics Associated Signs and Symptoms: Patient reports having increase swelling. 60 year old gentleman who is a patient of Dr. Nash Shearer and was seen by him recently on November 1 for a history of having scraped both legs with a piece of metal and was seen in the ER and the right leg was closed with staples. He was placed on Keflex and. Dr. Loanne Drilling treated his diabetes appropriately and also prescribed doxycycline to be taken. 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 he is status post axillary hidradenitis excision, cataract extraction,laser ablation of condyloma, hemorrhoid surgery, incision and drainage of abscess, transurethral resection of bladder tumor. he smokes a pack of cigarettes every day. the patient has been seen by Dr. Fletcher Anon in the past and he has had a workup done which was remote in 2015 which showed the right  side showed a ABI of 0.44 and the left was normal. 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 No intervention was done and he was put on Cilostazol. Not find any electronic records of recent tests done since August 2015. 06/23/2016 -- the patient continues to smoke half a pack of cigarettes a day and is working on giving up completely. 07/07/2016 -- he is using medihoney instead of Santyl as it was not affordable. [name], [name] ([id number]) Objective Constitutional Pulse regular. Respirations normal and unlabored. Afebrile. Vitals Time Taken: 1:48 PM, Height: 68 in, Weight: 202 lbs, BMI: 30.7, Temperature: 97.5  F, Pulse: 85 bpm, Respiratory Rate: 16 breaths/min, Blood Pressure: 134/74 mmHg. Eyes Nonicteric. Reactive to light. Ears, Nose, Mouth, and Throat Lips, teeth, and gums WNL.Marland Kitchen Moist mucosa without lesions. Neck supple and nontender. No palpable supraclavicular or cervical adenopathy. Normal sized without goiter. Respiratory WNL. No retractions.. Breath sounds WNL, No rubs, rales, rhonchi, or wheeze.. Cardiovascular Heart rhythm and rate regular, no murmur or gallop.. Pedal Pulses WNL. No clubbing, cyanosis or edema. Lymphatic No adneopathy. No adenopathy. No adenopathy. Musculoskeletal Adexa without tenderness or enlargement.. Digits and nails w/o clubbing, cyanosis, infection, petechiae, ischemia, or inflammatory conditions.Marland Kitchen Psychiatric Judgement and insight Intact.. No evidence of depression, anxiety, or agitation.. General Notes: there is some excoriation around the main wound and this may have been due to trauma. The main wound continues to have some eschar and subcutaneous debris and this was sharply removed with a #3 curet and bleeding controlled with pressure Integumentary (Hair, Skin) No suspicious lesions. No crepitus or fluctuance. No peri-wound warmth or erythema. No masses.. Wound #1 status is Open. Original cause of wound was Trauma. The wound is located on the Right Lower Leg. The wound measures 4cm length x 0.8cm width x 0.2cm depth; 2.513cm^2 area and 0.503cm^3 [name], [name]. ([id number]) volume. The wound is limited to skin breakdown. There is no tunneling or undermining noted. There is a large amount of sanguinous drainage noted. The wound margin is flat and intact. There is medium (34- 66%) pink granulation within the wound bed. There is a medium (34-66%) amount of necrotic tissue within the wound bed including Eschar and Adherent Slough. The periwound skin appearance exhibited: Induration, Scarring, Dry/Scaly. The periwound skin appearance did not exhibit: Callus,  Crepitus, Excoriation, Fluctuance, Friable, Localized Edema, Rash, Maceration, Moist, Atrophie Blanche, Cyanosis, Ecchymosis, Hemosiderin Staining, Mottled, Pallor, Rubor, Erythema. Periwound temperature was noted as No Abnormality. The periwound has tenderness on palpation. Assessment Active Problems ICD-10 E11.621 - Type 2 diabetes mellitus with foot ulcer S81.811A - Laceration without foreign body, right lower leg, initial encounter F17.218 - Nicotine dependence, cigarettes, with other nicotine-induced disorders Procedures Wound #1 Wound #1 is a Diabetic Wound/Ulcer of the Lower Extremity located on the Right Lower Leg . There was a Skin/Subcutaneous Tissue Debridement [id number]) debridement with total area of 3.2 sq cm performed by Christin Fudge, MD. with the following instrument(s): Curette to remove Viable and Non-Viable tissue/material including Fibrin/Slough, Eschar, and Subcutaneous after achieving pain control using Other (lidocaine 4%). A time out was conducted at 14:08, prior to the start of the procedure. A Minimum amount of bleeding was controlled with Pressure. The procedure was tolerated well with a pain level of 0 throughout and a pain level of 0 following the procedure. Post Debridement Measurements: 4cm length x 0.8cm width x 0.3cm depth;  0.754cm^3 volume. Character of Wound/Ulcer Post Debridement requires further debridement. Severity of Tissue Post Debridement is: Fat layer exposed. Post procedure Diagnosis Wound #1: Same as Pre-Procedure Plan [name], [name]. ([id number]) we'll continue to use some many honey this week and I have asked him not to use any adherent dressing to the surrounding skin. I have also asked him to get hold of 20-30 mm compression stockings and wear this daily for monitoring tonight. He will come back and see as next week on Thursday due to a preop appointment at [facility]) Signed: 07/21/2016 2:16:29 PM By: Christin Fudge MD, FACS Entered By: Christin Fudge on 07/21/2016 14:16:29 [name] ([id number]) -------------------------------------------------------------------------------- SuperBill Details Patient Name: [name] Date of Service: 07/21/2016 Medical Record Number: [id number] Patient Account Number: [id number] Date of Birth/Sex: Jan 01, 1956 (60 y.o. Male) Treating RN: Cornell Barman Primary Care Physician: [name] Other Clinician: Referring Physician: [name] Treating Physician/Extender: Frann Rider in Treatment: 5 Diagnosis Coding ICD-10 Codes Code Description E11.621 Type 2 diabetes mellitus with foot ulcer S81.811A Laceration without foreign body, right lower leg, initial encounter F17.218 Nicotine dependence, cigarettes, with other nicotine-induced disorders Facility Procedures CPT4 Code Description: [id number] - DEB SUBQ TISSUE 20 SQ CM/< ICD-10 Description Diagnosis E11.621 Type 2 diabetes mellitus with foot ulcer S81.811A Laceration without foreign body, right lower leg, initi F17.218 Nicotine dependence, cigarettes,  with other nicotine-in Modifier: al encounte duced disor Quantity: 1 r ders Physician Procedures CPT4 Code Description: [id number] - WC PHYS SUBQ TISS 20 SQ CM ICD-10 Description Diagnosis E11.621 Type 2 diabetes mellitus with foot ulcer S81.811A Laceration without foreign body, right lower leg, initi F17.218 Nicotine dependence, cigarettes, with  other nicotine-in Modifier: al encounte duced disor Quantity: 1 r ders Electronic Signature(s) Signed: 07/21/2016 2:16:38 PM By: Christin Fudge MD, FACS Entered By: Christin Fudge on 07/21/2016 14:16:38

## 2016-07-22 NOTE — Progress Notes (Signed)
[name] ([id number]) Visit Report for 07/21/2016 Arrival Information Details Patient Name: [name], [name] Date of Service: 07/21/2016 1:30 PM Medical Record Number: [id number] Patient Account Number: [id number] Date of Birth/Sex: 11/20/1955 (60 y.o. Male) Treating RN: [name] Primary Care Physician: [name] Other Clinician: Referring Physician: Renato [name] Treating Physician/Extender: [name] in Treatment: 5 Visit Information History Since Last Visit Added or deleted any medications: No Patient Arrived: Ambulatory Any new allergies or adverse reactions: No Arrival Time: 13:47 Had a fall or experienced change in No Accompanied By: wife activities of daily living that may affect Transfer Assistance: None risk of falls: Patient Identification Verified: Yes Signs or symptoms of abuse/neglect since last No Secondary Verification Process Yes visito Completed: Hospitalized since last visit: No Patient Has Alerts: Yes Has Dressing in Place as Prescribed: Yes Patient Alerts: Patient on Blood Pain Present Now: No Thinner DM Type II Electronic Signature(s) Signed: 07/21/2016 6:04:04 PM By: [name] Entered By: Paul Cool, RN, [name], Kim on 07/21/2016 13:48:07 Claros, [name] ([id number]) -------------------------------------------------------------------------------- Encounter Discharge Information Details Patient Name: [name] Date of Service: 07/21/2016 1:30 PM Medical Record Number: [id number] Patient Account Number: [id number] Date of Birth/Sex: 1956/06/18 (60 y.o. Male) Treating RN: [name] Primary Care Physician: [name] Other Clinician: Referring Physician: Renato [name] Treating Physician/Extender: [name] in Treatment: 5 Encounter Discharge Information Items Discharge Pain Level: 0 Discharge Condition: Stable Ambulatory Status: Ambulatory Discharge Destination: Home Transportation: Private  Auto Accompanied By: wife Schedule Follow-up Appointment: Yes Medication Reconciliation completed and provided to Patient/Care Yes [name]: Provided on Clinical Summary of Care: 07/21/2016 Form Type Recipient Paper Patient TW Electronic Signature(s) Signed: 07/21/2016 2:23:52 PM By: [name] Entered By: [name] on 07/21/2016 14:23:51 [name], [name] ([id number]) -------------------------------------------------------------------------------- Lower Extremity Assessment Details Patient Name: [name] Date of Service: 07/21/2016 1:30 PM Medical Record Number: [id number] Patient Account Number: [id number] Date of Birth/Sex: 1956/06/02 (60 y.o. Male) Treating RN: [name] Primary Care Physician: [name] Other Clinician: Referring Physician: Renato [name] Treating Physician/Extender: [name] in Treatment: 5 Edema Assessment Assessed: [Left: No] [Right: No] E[Left: dema] [Right: :] Calf Left: Right: Point of Measurement: 34 cm From Medial Instep cm 39 cm Ankle Left: Right: Point of Measurement: 12 cm From Medial Instep cm 23.4 cm Vascular Assessment Pulses: Posterior Tibial Palpable: [Right:Yes] Dorsalis Pedis Palpable: [Right:No] Doppler: [Right:Monophasic] Extremity colors, hair growth, and conditions: Extremity Color: [Right:Normal] Hair Growth on Extremity: [Right:Yes] Temperature of Extremity: [Right:Warm] Capillary Refill: [Right:< 3 seconds] Dependent Rubor: [Right:No] Blanched when Elevated: [Right:No] Toe Nail Assessment Left: Right: Thick: No Discolored: No Deformed: No Improper Length and Hygiene: Yes Electronic Signature(s) [name] ([id number]) Signed: 07/21/2016 6:04:04 PM By: [name] Entered By: Paul Cool, RN, [name], Kim on 07/21/2016 [name], [name] ([id number]) -------------------------------------------------------------------------------- Multi Wound Chart Details Patient Name:  [name] Date of Service: 07/21/2016 1:30 PM Medical Record Number: [id number] Patient Account Number: [id number] Date of Birth/Sex: 1955/09/08 (60 y.o. Male) Treating RN: [name] Primary Care Physician: [name] Other Clinician: Referring Physician: Renato [name] Treating Physician/Extender: [name] in Treatment: 5 Vital Signs Height(in): 68 Pulse(bpm): 85 Weight(lbs): 202 Blood Pressure 134/74 (mmHg): Body Mass Index(BMI): 31 Temperature(F): 97.5 Respiratory Rate 16 (breaths/min): Photos: [N/A:N/A] Wound Location: Right Lower Leg N/A N/A Wounding Event: Trauma N/A N/A Primary Etiology: Diabetic Wound/Ulcer of N/A N/A the Lower Extremity Secondary Etiology: Trauma, Other N/A N/A Comorbid History: Cataracts, Deep Vein  N/A N/A Thrombosis, Hypertension, Type II Diabetes, Neuropathy Date Acquired: 06/05/2016 N/A N/A [name] of Treatment: 5 N/A N/A Wound Status: Open N/A N/A Measurements L x W x D 4x0.8x0.2 N/A N/A (cm) Area (cm) : 2.513 N/A N/A Volume (cm) : 0.503 N/A N/A % Reduction in Area: 75.40% N/A N/A % Reduction in Volume: 50.70% N/A N/A Classification: Grade 1 N/A N/A Exudate Amount: Large N/A N/A Exudate Type: Sanguinous N/A N/A Exudate Color: red N/A N/A Wound Margin: Flat and Intact N/A N/A Granulation Amount: Medium (34-66%) N/A N/A FAHAD, LADER. ([name]) Granulation Quality: Pink N/A N/A Necrotic Amount: Medium (34-66%) N/A N/A Necrotic Tissue: Eschar, Adherent Slough N/A N/A Exposed Structures: Fascia: No N/A N/A Fat: No Tendon: No Muscle: No Joint: No Bone: No Limited to Skin Breakdown Epithelialization: Small (1-33%) N/A N/A Periwound Skin Texture: Induration: Yes N/A N/A Scarring: Yes Edema: No Excoriation: No Callus: No Crepitus: No Fluctuance: No Friable: No Rash: No Periwound Skin Dry/Scaly: Yes N/A N/A Moisture: Maceration: No Moist: No Periwound Skin Color: Atrophie Blanche: No N/A  N/A Cyanosis: No Ecchymosis: No Erythema: No Hemosiderin Staining: No Mottled: No Pallor: No Rubor: No Temperature: No Abnormality N/A N/A Tenderness on Yes N/A N/A Palpation: Wound Preparation: Ulcer Cleansing: N/A N/A Rinsed/Irrigated with Saline Topical Anesthetic Applied: Other: lidocaine 4% Treatment Notes Electronic Signature(s) Signed: 07/21/2016 6:04:04 PM By: [name] Entered By: Paul Cool, RN, [name], Kim on 07/21/2016 14:00:04 [name], [name] ([name]) [name], [name] ([name]) -------------------------------------------------------------------------------- [place] Details Patient Name: [name] Date of Service: 07/21/2016 1:30 PM Medical Record Number: [name] Patient Account Number: [id number] Date of Birth/Sex: 1956/08/02 (60 y.o. Male) Treating RN: [name] Primary Care Physician: [name] Other Clinician: Referring Physician: Renato [name] Treating Physician/Extender: [name] in Treatment: 5 Active Inactive Necrotic Tissue Nursing Diagnoses: Impaired tissue integrity related to necrotic/devitalized tissue Knowledge deficit related to management of necrotic/devitalized tissue Goals: Necrotic/devitalized tissue will be minimized in the wound bed Date Initiated: 06/23/2016 Goal Status: Active Patient/caregiver will verbalize understanding of reason and process for debridement of necrotic tissue Date Initiated: 06/23/2016 Goal Status: Active Interventions: Assess patient pain level pre-, during and post procedure and prior to discharge Provide education on necrotic tissue and debridement process Treatment Activities: Apply topical anesthetic as ordered : 06/23/2016 Notes: Nutrition Nursing Diagnoses: Impaired glucose control: actual or potential Goals: Patient/caregiver agrees to and verbalizes understanding of need to obtain nutritional consultation Date Initiated: 06/16/2016 Goal  Status: Active Interventions: Assess HgA1c results as ordered upon admission and as needed Notes: [name], [name] ([name]) Orientation to the Wound Care Program Nursing Diagnoses: Knowledge deficit related to the wound healing center program Goals: Patient/caregiver will verbalize understanding of the [place] Program Date Initiated: 06/16/2016 Goal Status: Active Interventions: Provide education on orientation to the wound center Notes: Wound/Skin Impairment Nursing Diagnoses: Impaired tissue integrity Goals: Ulcer/skin breakdown will heal within 14 [name] Date Initiated: 06/16/2016 Goal Status: Active Interventions: Assess patient/caregiver ability to obtain necessary supplies Treatment Activities: Refer to smoking cessation program : 06/16/2016 Notes: Electronic Signature(s) Signed: 07/21/2016 6:04:04 PM By: [name] Entered By: Paul Cool, RN, [name], Kim on 07/21/2016 13:59:35 [name], [name] ([name]) -------------------------------------------------------------------------------- Pain Assessment Details Patient Name: [name] Date of Service: 07/21/2016 1:30 PM Medical Record Number: [name] Patient Account Number: [id number] Date of Birth/Sex: 01-Dec-1955 (60 y.o. Male) Treating RN: [name] Primary Care Physician: [name] Other Clinician: Referring Physician: Renato [name] Treating Physician/Extender: [name],  [name] in Treatment: 5 Active Problems Location of Pain Severity and Description of Pain Patient Has Paino No Site Locations With Dressing Change: No Pain Management and Medication Current Pain Management: Goals for Pain Management Topical or injectable lidocaine is offered to patient for acute pain when surgical debridement is performed. If needed, Patient is instructed to use over the counter pain medication for the following 24-48 hours after debridement. Wound care MDs do not prescribed pain  medications. Patient has chronic pain or uncontrolled pain. Patient has been instructed to make an appointment with their Primary Care Physician for pain management. Electronic Signature(s) Signed: 07/21/2016 6:04:04 PM By: [name] Entered By: Paul Cool, RN, [name], Kim on 07/21/2016 13:48:25 Grajales, [name] ([name]) -------------------------------------------------------------------------------- Patient/Caregiver Education Details Patient Name: [name] Date of Service: 07/21/2016 1:30 PM Medical Record Number: [name] Patient Account Number: [id number] Date of Birth/Gender: Dec 22, 1955 (60 y.o. Male) Treating RN: [name] Primary Care Physician: [name] Other Clinician: Referring Physician: Renato [name] Treating Physician/Extender: [name] in Treatment: 5 Education Assessment Education Provided To: Patient Education Topics Provided Wound/Skin Impairment: Handouts: Caring for Your Ulcer, Other: wound care as prescribed. Methods: Demonstration Responses: State content correctly Electronic Signature(s) Signed: 07/21/2016 6:04:04 PM By: [name] Entered By: Paul Cool, RN, [name], Kim on 07/21/2016 14:22:38 [name] ([name]) -------------------------------------------------------------------------------- Wound Assessment Details Patient Name: [name] Date of Service: 07/21/2016 1:30 PM Medical Record Number: [name] Patient Account Number: [id number] Date of Birth/Sex: 17-Feb-1956 (60 y.o. Male) Treating RN: [name] Primary Care Physician: [name] Other Clinician: Referring Physician: Renato [name] Treating Physician/Extender: [name] in Treatment: 5 Wound Status Wound Number: 1 Primary Diabetic Wound/Ulcer of the Lower Etiology: Extremity Wound Location: Right Lower Leg Secondary Trauma, Other Wounding Event: Trauma Etiology: Date Acquired: 06/05/2016 Wound Open [name] Of  Treatment: 5 Status: Clustered Wound: No Comorbid Cataracts, Deep Vein Thrombosis, History: Hypertension, Type II Diabetes, Neuropathy Photos Wound Measurements Length: (cm) 4 Width: (cm) 0.8 Depth: (cm) 0.2 Area: (cm) 2.513 Volume: (cm) 0.503 % Reduction in Area: 75.4% % Reduction in Volume: 50.7% Epithelialization: Small (1-33%) Tunneling: No Undermining: No Wound Description Classification: Grade 1 Wound Margin: Flat and Intact Exudate Amount: Large Exudate Type: Sanguinous Exudate Color: red Foul Odor After Cleansing: No Wound Bed Granulation Amount: Medium (34-66%) Exposed Structure Granulation Quality: Pink Fascia Exposed: No Necrotic Amount: Medium (34-66%) Fat Layer Exposed: No Necrotic Quality: Eschar, Adherent Slough Tendon Exposed: No Muscle Exposed: No [name], [name] ([name]) Joint Exposed: No Bone Exposed: No Limited to Skin Breakdown Periwound Skin Texture Texture Color No Abnormalities Noted: No No Abnormalities Noted: No Callus: No Atrophie Blanche: No Crepitus: No Cyanosis: No Excoriation: No Ecchymosis: No Fluctuance: No Erythema: No Friable: No Hemosiderin Staining: No Induration: Yes Mottled: No Localized Edema: No Pallor: No Rash: No Rubor: No Scarring: Yes Temperature / Pain Moisture Temperature: No Abnormality No Abnormalities Noted: No Tenderness on Palpation: Yes Dry / Scaly: Yes Maceration: No Moist: No Wound Preparation Ulcer Cleansing: Rinsed/Irrigated with Saline Topical Anesthetic Applied: Other: lidocaine 4%, Treatment Notes Wound #1 (Right Lower Leg) 1. Cleansed with: Cleanse wound with antibacterial soap and water May Shower, gently pat wound dry prior to applying new dressing. 2. Anesthetic Topical Lidocaine 4% cream to wound bed prior to debridement 4. Dressing Applied: Medihoney Gel Non-Adherent gauze 5. Secondary Dressing Applied Kerlix/Conform Electronic Signature(s) Signed: 07/21/2016  6:04:04 PM By: [name] Entered By: Paul Cool, RN,  [name], Kim on 07/21/2016 13:58:08 Glantz, [name] ([name]) -------------------------------------------------------------------------------- [place] Details Patient Name: [name] Date of Service: 07/21/2016 1:30 PM Medical Record Number: [name] Patient Account Number: [id number] Date of Birth/Sex: [birth year] (60 y.o. Male) Treating RN: [name] Primary Care Physician: [name] Other Clinician: Referring Physician: Renato [name] Treating Physician/Extender: [name] in Treatment: 5 Vital Signs Time Taken: 13:48 Temperature (F): 97.5 Height (in): 68 Pulse (bpm): 85 Weight (lbs): 202 Respiratory Rate (breaths/min): 16 Body Mass Index (BMI): 30.7 Blood Pressure (mmHg): 134/74 Reference Range: 80 - 120 mg / dl Electronic Signature(s) Signed: 07/21/2016 6:04:04 PM By: [name] Entered By: Paul Cool, RN, [name], Kim on 07/21/2016 13:48:42

## 2016-07-31 ENCOUNTER — Encounter: Payer: Medicare Other | Admitting: Nurse Practitioner

## 2016-07-31 DIAGNOSIS — S81812A Laceration without foreign body, left lower leg, initial encounter: Secondary | ICD-10-CM | POA: Diagnosis not present

## 2016-07-31 DIAGNOSIS — L02412 Cutaneous abscess of left axilla: Secondary | ICD-10-CM | POA: Diagnosis not present

## 2016-07-31 DIAGNOSIS — L089 Local infection of the skin and subcutaneous tissue, unspecified: Secondary | ICD-10-CM | POA: Diagnosis not present

## 2016-07-31 DIAGNOSIS — E11621 Type 2 diabetes mellitus with foot ulcer: Secondary | ICD-10-CM | POA: Diagnosis not present

## 2016-07-31 DIAGNOSIS — E11622 Type 2 diabetes mellitus with other skin ulcer: Secondary | ICD-10-CM | POA: Diagnosis not present

## 2016-07-31 DIAGNOSIS — Z85828 Personal history of other malignant neoplasm of skin: Secondary | ICD-10-CM | POA: Diagnosis not present

## 2016-07-31 DIAGNOSIS — L97812 Non-pressure chronic ulcer of other part of right lower leg with fat layer exposed: Secondary | ICD-10-CM | POA: Diagnosis not present

## 2016-07-31 DIAGNOSIS — E1140 Type 2 diabetes mellitus with diabetic neuropathy, unspecified: Secondary | ICD-10-CM | POA: Diagnosis not present

## 2016-07-31 DIAGNOSIS — Z794 Long term (current) use of insulin: Secondary | ICD-10-CM | POA: Diagnosis not present

## 2016-07-31 DIAGNOSIS — S81811A Laceration without foreign body, right lower leg, initial encounter: Secondary | ICD-10-CM | POA: Diagnosis not present

## 2016-07-31 DIAGNOSIS — Z8551 Personal history of malignant neoplasm of bladder: Secondary | ICD-10-CM | POA: Diagnosis not present

## 2016-07-31 DIAGNOSIS — I10 Essential (primary) hypertension: Secondary | ICD-10-CM | POA: Diagnosis not present

## 2016-08-01 ENCOUNTER — Other Ambulatory Visit
Admission: RE | Admit: 2016-08-01 | Discharge: 2016-08-01 | Disposition: A | Discharge status: Home / Self Care | Payer: Medicare Other | Source: Ambulatory Visit | Attending: Nurse Practitioner | Admitting: Nurse Practitioner

## 2016-08-01 DIAGNOSIS — L02412 Cutaneous abscess of left axilla: Secondary | ICD-10-CM | POA: Insufficient documentation

## 2016-08-01 NOTE — Progress Notes (Signed)
Paul, [name] ([id number]) Visit Report for 07/31/2016 Arrival Information Details Patient Name: [name], [name] Date of Service: 07/31/2016 12:30 PM Medical Record Number: [id number] Patient Account Number: [id number] Date of Birth/Sex: 1956-03-23 (60 y.o. Male) Treating RN: Baruch Gouty, RN, BSN, Velva Harman Primary Care Physician: [name] Other Clinician: Referring Physician: [name] Treating Physician/Extender: Frann Rider in Treatment: 6 Visit Information History Since Last Visit All ordered tests and consults were completed: No Patient Arrived: Ambulatory Added or deleted any medications: No Arrival Time: 12:55 Any new allergies or adverse reactions: No Accompanied By: self Had a fall or experienced change in No Transfer Assistance: None activities of daily living that may affect Patient Identification Verified: Yes risk of falls: Secondary Verification Process Yes Signs or symptoms of abuse/neglect since last No Completed: visito Patient Has Alerts: Yes Hospitalized since last visit: No Patient Alerts: Patient on Blood Has Dressing in Place as Prescribed: Yes Thinner Has Compression in Place as Prescribed: Yes DM Type II Pain Present Now: No Electronic Signature(s) Signed: 07/31/2016 5:18:59 PM By: Regan Lemming BSN, RN Entered By: Regan Lemming on 07/31/2016 12:55:46 [name] ([id number]) -------------------------------------------------------------------------------- Encounter Discharge Information Details Patient Name: [name] Date of Service: 07/31/2016 12:30 PM Medical Record Number: [id number] Patient Account Number: [id number] Date of Birth/Sex: [birth year] (60 y.o. Male) Treating RN: Baruch Gouty, RN, BSN, Velva Harman Primary Care Physician: [name] Other Clinician: Referring Physician: [name] Treating Physician/Extender: Cathie Olden in Treatment: 6 Encounter Discharge Information Items Discharge Pain Level: 0 Discharge  Condition: Stable Ambulatory Status: Ambulatory Discharge Destination: Home Transportation: Private Auto Accompanied By: wife Schedule Follow-up Appointment: No Medication Reconciliation completed and provided to Patient/Care No [place]: Provided on Clinical Summary of Care: 07/31/2016 Form Type Recipient Paper Patient TW Electronic Signature(s) Signed: 07/31/2016 1:35:11 PM By: Ruthine Dose Entered By: Ruthine Dose on 07/31/2016 13:35:10 Cleckley, Mora Appl ([id number]) -------------------------------------------------------------------------------- Lower Extremity Assessment Details Patient Name: [name] Date of Service: 07/31/2016 12:30 PM Medical Record Number: [id number] Patient Account Number: [id number] Date of Birth/Sex: Apr 13, 1956 (60 y.o. Male) Treating RN: Baruch Gouty, RN, BSN, Velva Harman Primary Care Physician: [name] Other Clinician: Referring Physician: [name] Treating Physician/Extender: Frann Rider in Treatment: 6 Edema Assessment Assessed: [Left: No] [Right: No] E[Left: dema] [Right: :] Calf Left: Right: Point of Measurement: 34 cm From Medial Instep cm 39.5 cm Ankle Left: Right: Point of Measurement: 12 cm From Medial Instep cm 23.4 cm Vascular Assessment Claudication: Claudication Assessment [Right:None] Pulses: Dorsalis Pedis Palpable: [Right:Yes] Posterior Tibial Extremity colors, hair growth, and conditions: Extremity Color: [Right:Mottled] Hair Growth on Extremity: [Right:Yes] Temperature of Extremity: [Right:Warm] Capillary Refill: [Right:< 3 seconds] Electronic Signature(s) Signed: 07/31/2016 5:18:59 PM By: Regan Lemming BSN, RN Entered By: Regan Lemming on 07/31/2016 [id number] [name] ([id number]) -------------------------------------------------------------------------------- Multi Wound Chart Details Patient Name: [name] Date of Service: 07/31/2016 12:30 PM Medical Record Number: [id number] Patient Account  Number: [id number] Date of Birth/Sex: 03-04-1956 (60 y.o. Male) Treating RN: Baruch Gouty, RN, BSN, [place] Primary Care Physician: [name] Other Clinician: Referring Physician: [name] Treating Physician/Extender: Frann Rider in Treatment: 6 Vital Signs Height(in): 68 Pulse(bpm): 78 Weight(lbs): 202 Blood Pressure 140/72 (mmHg): Body Mass Index(BMI): 31 Temperature(F): 98.0 Respiratory Rate 16 (breaths/min): Photos: [1:No Photos] [N/A:N/A] Wound Location: [1:Right Lower Leg] [N/A:N/A] Wounding Event: [1:Trauma] [N/A:N/A] Primary Etiology: [1:Diabetic Wound/Ulcer of the Lower Extremity] [N/A:N/A] Secondary Etiology: [1:Trauma, Other] [N/A:N/A] Comorbid History: [1:Cataracts, Deep Vein Thrombosis, Hypertension, Type II Diabetes, Neuropathy] [N/A:N/A] Date Acquired: [1:06/05/2016] [N/A:N/A] Weeks of Treatment: [  1:6] [N/A:N/A] Wound Status: [1:Open] [N/A:N/A] Measurements L x W x D 4.2x0.8x0.2 [N/A:N/A] (cm) Area (cm) : [1:2.639] [N/A:N/A] Volume (cm) : [1:0.528] [N/A:N/A] % Reduction in Area: [1:74.20%] [N/A:N/A] % Reduction in Volume: 48.30% [N/A:N/A] Classification: [1:Grade 1] [N/A:N/A] Exudate Amount: [1:Large] [N/A:N/A] Exudate Type: [1:Sanguinous] [N/A:N/A] Exudate Color: [1:red] [N/A:N/A] Wound Margin: [1:Flat and Intact] [N/A:N/A] Granulation Amount: [1:Medium (34-66%)] [N/A:N/A] Granulation Quality: [1:Pink] [N/A:N/A] Necrotic Amount: [1:Medium (34-66%)] [N/A:N/A] Necrotic Tissue: [1:Eschar, Adherent Slough] [N/A:N/A] Exposed Structures: [1:Fascia: No Fat: No] [N/A:N/A] Tendon: No Muscle: No Joint: No Bone: No Limited to Skin Breakdown Epithelialization: Small (1-33%) N/A N/A Periwound Skin Texture: Induration: Yes N/A N/A Scarring: Yes Edema: No Excoriation: No Callus: No Crepitus: No Fluctuance: No Friable: No Rash: No Periwound Skin Dry/Scaly: Yes N/A N/A Moisture: Maceration: No Moist: No Periwound Skin Color: Atrophie Blanche:  No N/A N/A Cyanosis: No Ecchymosis: No Erythema: No Hemosiderin Staining: No Mottled: No Pallor: No Rubor: No Temperature: No Abnormality N/A N/A Tenderness on Yes N/A N/A Palpation: Wound Preparation: Ulcer Cleansing: N/A N/A Rinsed/Irrigated with Saline Topical Anesthetic Applied: Other: lidocaine 4% Treatment Notes Electronic Signature(s) Signed: 07/31/2016 5:18:59 PM By: Regan Lemming BSN, RN Entered By: Regan Lemming on 07/31/2016 13:02:25 [name] ([id number]) -------------------------------------------------------------------------------- [place] Details Patient Name: [name] Date of Service: 07/31/2016 12:30 PM Medical Record Number: [id number] Patient Account Number: [id number] Date of Birth/Sex: 02-19-1956 (60 y.o. Male) Treating RN: Baruch Gouty, RN, BSN, Velva Harman Primary Care Physician: [name] Other Clinician: Referring Physician: [name] Treating Physician/Extender: Frann Rider in Treatment: 6 Active Inactive Necrotic Tissue Nursing Diagnoses: Impaired tissue integrity related to necrotic/devitalized tissue Knowledge deficit related to management of necrotic/devitalized tissue Goals: Necrotic/devitalized tissue will be minimized in the wound bed Date Initiated: 06/23/2016 Goal Status: Active Patient/caregiver will verbalize understanding of reason and process for debridement of necrotic tissue Date Initiated: 06/23/2016 Goal Status: Active Interventions: Assess patient pain level pre-, during and post procedure and prior to discharge Provide education on necrotic tissue and debridement process Treatment Activities: Apply topical anesthetic as ordered : 06/23/2016 Notes: Nutrition Nursing Diagnoses: Impaired glucose control: actual or potential Goals: Patient/caregiver agrees to and verbalizes understanding of need to obtain nutritional consultation Date Initiated: 06/16/2016 Goal Status:  Active Interventions: Assess HgA1c results as ordered upon admission and as needed Notes: JACSEN, POWLES ([id number]) Orientation to the Wound Care Program Nursing Diagnoses: Knowledge deficit related to the wound healing center program Goals: Patient/caregiver will verbalize understanding of the [facility] Date Initiated: 06/16/2016 Goal Status: Active Interventions: Provide education on orientation to the wound center Notes: Wound/Skin Impairment Nursing Diagnoses: Impaired tissue integrity Goals: Ulcer/skin breakdown will heal within 14 weeks Date Initiated: 06/16/2016 Goal Status: Active Interventions: Assess patient/caregiver ability to obtain necessary supplies Treatment Activities: Refer to smoking cessation program : 06/16/2016 Notes: Electronic Signature(s) Signed: 07/31/2016 5:18:59 PM By: Regan Lemming BSN, RN Entered By: Regan Lemming on 07/31/2016 13:02:03 [name] ([id number]) -------------------------------------------------------------------------------- Pain Assessment Details Patient Name: [name] Date of Service: 07/31/2016 12:30 PM Medical Record Number: [id number] Patient Account Number: [id number] Date of Birth/Sex: [birth year] (60 y.o. Male) Treating RN: Baruch Gouty, RN, BSN, [place] Primary Care Physician: [name] Other Clinician: Referring Physician: [name] Treating Physician/Extender: Frann Rider in Treatment: 6 Active Problems Location of Pain Severity and Description of Pain Patient Has Paino No Site Locations With Dressing Change: No Pain Management and Medication Current Pain Management: Electronic Signature(s) Signed: 07/31/2016 5:18:59 PM By: Regan Lemming BSN, RN Entered By:  Regan Lemming on 07/31/2016 12:55:52 COTTRELL, LHUILLIER ([id number]) -------------------------------------------------------------------------------- Patient/Caregiver Education Details Patient Name: [name] Date of  Service: 07/31/2016 12:30 PM Medical Record Number: [id number] Patient Account Number: [id number] Date of Birth/Gender: January 29, 1956 (60 y.o. Male) Treating RN: Baruch Gouty, RN, BSN, [place] Primary Care Physician: [name] Other Clinician: Referring Physician: [name] Treating Physician/Extender: Cathie Olden in Treatment: 6 Education Assessment Education Provided To: Patient Education Topics Provided Welcome To [facility]: Methods: Explain/Verbal Responses: State content correctly Wound Debridement: Methods: Explain/Verbal Responses: State content correctly Electronic Signature(s) Signed: 07/31/2016 5:18:59 PM By: Regan Lemming BSN, RN Entered By: Regan Lemming on 07/31/2016 13:33:19 [name] ([id number]) -------------------------------------------------------------------------------- Wound Assessment Details Patient Name: [name] Date of Service: 07/31/2016 12:30 PM Medical Record Number: [id number] Patient Account Number: [id number] Date of Birth/Sex: 1956/05/31 (60 y.o. Male) Treating RN: Baruch Gouty, RN, BSN, [place] Primary Care Physician: [name] Other Clinician: Referring Physician: [name] Treating Physician/Extender: Frann Rider in Treatment: 6 Wound Status Wound Number: 1 Primary Diabetic Wound/Ulcer of the Lower Etiology: Extremity Wound Location: Right Lower Leg Secondary Trauma, Other Wounding Event: Trauma Etiology: Date Acquired: 06/05/2016 Wound Open Weeks Of Treatment: 6 Status: Clustered Wound: No Comorbid Cataracts, Deep Vein Thrombosis, History: Hypertension, Type II Diabetes, Neuropathy Photos Photo Uploaded By: Regan Lemming on 07/31/2016 16:43:34 Wound Measurements Length: (cm) 4.2 Width: (cm) 0.8 Depth: (cm) 0.2 Area: (cm) 2.639 Volume: (cm) 0.528 % Reduction in Area: 74.2% % Reduction in Volume: 48.3% Epithelialization: Small (1-33%) Tunneling: No Undermining: No Wound  Description Classification: Grade 1 Wound Margin: Flat and Intact Vanepps, Jaydenn W. ([id number]) Foul Odor After Cleansing: No Exudate Amount: Large Exudate Type: Sanguinous Exudate Color: red Wound Bed Granulation Amount: Medium (34-66%) Exposed Structure Granulation Quality: Pink Fascia Exposed: No Necrotic Amount: Medium (34-66%) Fat Layer Exposed: No Necrotic Quality: Eschar, Adherent Slough Tendon Exposed: No Muscle Exposed: No Joint Exposed: No Bone Exposed: No Limited to Skin Breakdown Periwound Skin Texture Texture Color No Abnormalities Noted: No No Abnormalities Noted: No Callus: No Atrophie Blanche: No Crepitus: No Cyanosis: No Excoriation: No Ecchymosis: No Fluctuance: No Erythema: No Friable: No Hemosiderin Staining: No Induration: Yes Mottled: No Localized Edema: No Pallor: No Rash: No Rubor: No Scarring: Yes Temperature / Pain Moisture Temperature: No Abnormality No Abnormalities Noted: No Tenderness on Palpation: Yes Dry / Scaly: Yes Maceration: No Moist: No Wound Preparation Ulcer Cleansing: Rinsed/Irrigated with Saline Topical Anesthetic Applied: Other: lidocaine 4%, Treatment Notes Wound #1 (Right Lower Leg) 1. Cleansed with: Clean wound with Normal Saline 4. Dressing Applied: Prisma Ag Iodoform packing Gauze 5. Secondary Dressing Applied Bordered Foam Dressing Dry Gauze ASSANTE, LAPRAIRIE ([id number]) Notes iodoform to left axillary Electronic Signature(s) Signed: 07/31/2016 5:18:59 PM By: Regan Lemming BSN, RN Entered By: Regan Lemming on 07/31/2016 13:00:58 [name] ([id number]) -------------------------------------------------------------------------------- Wound Assessment Details Patient Name: [name] Date of Service: 07/31/2016 12:30 PM Medical Record Number: [id number] Patient Account Number: [id number] Date of Birth/Sex: February 05, 1956 (60 y.o. Male) Treating RN: Baruch Gouty, RN, BSN, Velva Harman Primary Care Physician: [name] Other Clinician: Referring Physician: [name] Treating Physician/Extender: Cathie Olden in Treatment: 6 Wound Status Wound Number: 3 Primary Abscess Etiology: Wound Location: Left Axilla Wound Open Wounding Event: Pimple Status: Date Acquired: 07/31/2016 Comorbid Cataracts, Deep Vein Thrombosis, Weeks Of Treatment: 0 History: Hypertension, Type II Diabetes, Clustered Wound: No Neuropathy Photos Photo Uploaded By: Regan Lemming on 07/31/2016 16:43:46 Wound Measurements Length: (cm) 1 Width: (cm) 0.3 Depth: (cm) 6 Area: (  cm) 0.236 Volume: (cm) 1.414 % Reduction in Area: % Reduction in Volume: Epithelialization: None Tunneling: No Undermining: No Wound Description Full Thickness Without Exposed Classification: Support Structures Wound Margin: Distinct, outline attached Exudate Large Amount: Exudate Type: Serosanguineous Exudate Color: red, brown Foul Odor After Cleansing: No Wound Bed Granulation Amount: Medium (34-66%) Exposed Structure Granulation Quality: Pink Fascia Exposed: No NYHEEM, MADURO. ([id number]) Necrotic Amount: None Present (0%) Fat Layer Exposed: No Tendon Exposed: No Muscle Exposed: No Joint Exposed: No Bone Exposed: No Limited to Skin Breakdown Periwound Skin Texture Texture Color No Abnormalities Noted: No No Abnormalities Noted: No Callus: No Atrophie Blanche: No Crepitus: No Cyanosis: No Excoriation: No Ecchymosis: No Fluctuance: No Erythema: No Friable: No Hemosiderin Staining: No Induration: No Mottled: No Localized Edema: No Pallor: No Rash: No Rubor: No Scarring: No Moisture No Abnormalities Noted: No Dry / Scaly: No Maceration: No Moist: Yes Wound Preparation Ulcer Cleansing: Rinsed/Irrigated with Saline Topical Anesthetic Applied: Other: lidocaine 4%, Treatment Notes Wound #3 (Left Axilla) 1. Cleansed with: Clean wound with Normal Saline 4. Dressing Applied: Prisma Ag Iodoform packing  Gauze 5. Secondary Dressing Applied Bordered Foam Dressing Dry Gauze Notes iodoform to left axillary Electronic Signature(s) Signed: 07/31/2016 5:18:59 PM By: Regan Lemming BSN, RN Entered By: Regan Lemming on 07/31/2016 13:21:53 Grether, Mora Appl ([id number]) KAINAN, LANGFITT ([id number]) -------------------------------------------------------------------------------- Vitals Details Patient Name: [name] Date of Service: 07/31/2016 12:30 PM Medical Record Number: [id number] Patient Account Number: [id number] Date of Birth/Sex: 1955-10-27 (60 y.o. Male) Treating RN: Afful, RN, BSN, [place] Primary Care Physician: [name] Other Clinician: Referring Physician: [name] Treating Physician/Extender: Frann Rider in Treatment: 6 Vital Signs Time Taken: 12:56 Temperature (F): 98.0 Height (in): 68 Pulse (bpm): 78 Weight (lbs): 202 Respiratory Rate (breaths/min): 16 Body Mass Index (BMI): 30.7 Blood Pressure (mmHg): 140/72 Reference Range: 80 - 120 mg / dl Electronic Signature(s) Signed: 07/31/2016 5:18:59 PM By: Regan Lemming BSN, RN Entered By: Regan Lemming on 07/31/2016 12:58:00

## 2016-08-02 NOTE — Progress Notes (Signed)
[name], Paul ([id number]) Visit Report for 07/31/2016 Chief Complaint Document Details Patient Name: [name], [name] Date of Service: 07/31/2016 12:30 PM Medical Record Number: [id number] Patient Account Number: [id number] Date of Birth/Sex: 02-Apr-1956 (60 y.o. Male) Treating RN: Baruch Gouty, RN, BSN, Velva Harman Primary Care Physician: [name] Other Clinician: Referring Physician: [name] Treating Physician/Extender: Cathie Olden in Treatment: 6 Information Obtained from: Patient Chief Complaint Patients presents for treatment of an open diabetic ulcer and a recent laceration both lower extremities which happened on 06/05/2016 Electronic Signature(s) Signed: 07/31/2016 1:23:29 PM By: Rene Kocher, NP, [place] Entered By: Rene Kocher, NP, Maston Wight on 07/31/2016 13:23:28 [name] ([id number]) -------------------------------------------------------------------------------- Debridement Details Patient Name: [name] Date of Service: 07/31/2016 12:30 PM Medical Record Number: [id number] Patient Account Number: [id number] Date of Birth/Sex: 1955/11/05 (60 y.o. Male) Treating RN: Baruch Gouty, RN, BSN, [place] Primary Care Physician: [name] Other Clinician: Referring Physician: [name] Treating Physician/Extender: Cathie Olden in Treatment: 6 Debridement Performed for Wound #1 Right Lower Leg Assessment: Performed By: Physician Lawanda Cousins, NP Debridement: Open Wound/Selective Debridement Selective Description: Pre-procedure Yes - 13:05 Verification/Time Out Taken: Start Time: 13:05 Pain Control: Lidocaine 4% Topical Solution Total Area Debrided (L x 4.2 (cm) x 0.8 (cm) = 3.36 (cm) W): Tissue and other Non-Viable, Fibrin/Slough material debrided: Instrument: Curette Bleeding: Minimum Hemostasis Achieved: Pressure End Time: 13:09 Procedural Pain: 0 Post Procedural Pain: 0 Response to Treatment: Procedure was tolerated well Post Debridement Measurements  of Total Wound Length: (cm) 4.2 Width: (cm) 0.8 Depth: (cm) 0.2 Volume: (cm) 0.528 Character of Wound/Ulcer Post Stable Debridement: Severity of Tissue Post Debridement: Fat layer exposed Post Procedure Diagnosis Same as Pre-procedure Electronic Signature(s) Signed: 07/31/2016 1:32:31 PM By: Rene Kocher, NP, Zhana Jeangilles Signed: 07/31/2016 5:18:59 PM By: Regan Lemming BSN, RN Entered By: Rene Kocher, NP, Jenan Ellegood on 07/31/2016 13:32:31 Corti, Mora Appl ([name] ([id number]) -------------------------------------------------------------------------------- HPI Details Patient Name: [name] Date of Service: 07/31/2016 12:30 PM Medical Record Number: [id number] Patient Account Number: [id number] Date of Birth/Sex: 16-May-1956 (60 y.o. Male) Treating RN: Baruch Gouty, RN, BSN, [place] Primary Care Physician: [name] Other Clinician: Referring Physician: [name] Treating Physician/Extender: Cathie Olden in Treatment: 6 History of Present Illness Location: bilateral lower extremity lacerations with staples intact on the right lower extremity Quality: Patient reports experiencing a dull pain to affected area(s). Severity: Patient states wound (s) are getting better. Duration: Patient has had the wound for < 2 weeks prior to presenting for treatment Timing: Pain in wound is Intermittent (comes and goes Context: The wound occurred when the patient had lacerations with a metal sheet which had to be repaired in the ER Modifying Factors: Other treatment(s) tried include:as noted he has staples on the right lower extremity and has been on 2 courses of antibiotics Associated Signs and Symptoms: Patient reports having increase swelling. HPI Description: 60 year old gentleman who is a patient of Dr. Nash Shearer and was seen by him recently on November 1 for a history of having scraped both legs with a piece of metal and was seen in the ER and the right leg was closed with  staples. He was placed on Keflex and. Dr. Loanne Drilling treated his diabetes appropriately and also prescribed doxycycline to be taken. 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 he is status post axillary hidradenitis excision, cataract extraction,laser ablation of condyloma, hemorrhoid  surgery, incision and drainage of abscess, transurethral resection of bladder tumor. he smokes a pack of cigarettes every day. the patient has been seen by Dr. Fletcher Anon in the past and he has had a workup done which was remote in 2015 which showed the right side showed a ABI of 0.44 and the left was normal. 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 No intervention was done and he was put on Cilostazol. Not find any electronic records of recent tests done since August 2015. 06/23/2016 -- the patient continues to smoke half a pack of cigarettes a day and is working on giving up completely. 07/07/2016 -- he is using medihoney instead of Santyl as it was not affordable. 07-31-2016- Mr. Almonte returns for evaluation of his right lower extremity ulcer. He has been using Medi honey. He continues to smoke, but is down to approximately 7 cigarettes a day. He has plans to have complete cessation by the new year as he has a scheduled surgical procedure to his face in January. He voices no complaints to his right lower extremity wound. He does present today with an abscess to his left axillary region. He has purchased compression stockings (15-20 mmHg), attempted to wear them on his left lower extremity and was unable to tolerate. Electronic Signature(s) LAVAUGHN, DUVERNAY ([id number]) Signed:  07/31/2016 1:26:01 PM By: Rene Kocher, NP, Jana Hakim By: Rene Kocher, NP, Kerman Pfost on 07/31/2016 13:26:01 CAESYN, WARNE ([id number]) -------------------------------------------------------------------------------- Incision and Drainage Details Patient Name: [name] Date of Service: 07/31/2016 12:30 PM Medical Record Number: [id number] Patient Account Number: [id number] Date of Birth/Sex: 12-07-1955 (60 y.o. Male) Treating RN: Baruch Gouty, RN, BSN, Velva Harman Primary Care Physician: [name] Other Clinician: Referring Physician: [name] Treating Physician/Extender: Cathie Olden in Treatment: 6 Incision And Drainage Wound #3 Left Axilla Performed for: Performed By: Physician Lawanda Cousins, NP Incision And Drainage Abscess Type: Location: left axillary Pre-procedure Yes - 13:15 Verification/Time Out Taken: Pain Control: Lidocaine 4% Topical Solution Drainage Of: Sero-Sanguineous Instrument: Curette Bleeding: Minimum Hemostasis Achieved: Pressure Culture Sent: Swab Procedural Pain: 0 Post Procedural Pain: 0 Response to Procedure was tolerated well Treatment: Post Procedure Diagnosis Same as Pre-procedure Electronic Signature(s) Signed: 07/31/2016 1:32:40 PM By: Rene Kocher, NP, Calleen Alvis Entered By: Rene Kocher, NP, Lisl Slingerland on 07/31/2016 13:32:40 Rennie, Mora Appl ([id number]) -------------------------------------------------------------------------------- Physical Exam Details Patient Name: [name] Date of Service: 07/31/2016 12:30 PM Medical Record Number: [id number] Patient Account Number: [id number] Date of Birth/Sex: [birth year] (60 y.o. Male) Treating RN: Baruch Gouty, RN, BSN, [place] Primary Care Physician: [name] Other Clinician: Referring Physician: [name] Treating Physician/Extender: Cathie Olden in Treatment: 6 Constitutional BP within normal limits. afebrile. well nourished; well developed; appears stated age;Marland Kitchen Respiratory non-labored respiratory  effort. Cardiovascular non-palpable DP and PT. warm extremities; no edema present; no discoloration; cap refill less than or equal to 3 seconds. Integumentary (Hair, Skin) RLE - anterior ulcer is noted to have a large amount of granulation tissue with a minimal amount of biofilm/slough, no peri-wound erythema, no thermal changes; left axillary region reveals a large fluctuant, red abscess, very painful to palpation, warm to touch, no lymphangitis. Psychiatric appears to make sound judgement and have accurate insight regarding healthcare. oriented to time, place, person and situation. calm, pleasant, conversive. Electronic Signature(s) Signed: 07/31/2016 1:27:43 PM By: Rene Kocher, NP, Jana Hakim By: Rene Kocher, NP, Orley Lawry on 07/31/2016 13:27:43 Kant, Mora Appl ([id number]) --------------------------------------------------------------------------------  Physician Orders Details Patient Name: CONCETTO, SINGLETON Date of Service: 07/31/2016 12:30 PM Medical Record Number: [id number] Patient Account Number: [id number] Date of Birth/Sex: 11/21/1955 (60 y.o. Male) Treating RN: Baruch Gouty, RN, BSN, Velva Harman Primary Care Physician: [name] Other Clinician: Referring Physician: [name] Treating Physician/Extender: Cathie Olden in Treatment: 6 Verbal / Phone Orders: Yes Clinician: Afful, RN, BSN, Rita Read Back and Verified: Yes Diagnosis Coding Wound Cleansing Wound #1 Right Lower Leg o Clean wound with Normal Saline. o May shower with protection. Wound #3 Left Axilla o Clean wound with Normal Saline. o May shower with protection. Anesthetic Wound #1 Right Lower Leg o Topical Lidocaine 4% cream applied to wound bed prior to debridement - in office only Wound #3 Left Axilla o Topical Lidocaine 4% cream applied to wound bed prior to debridement - in office only Primary Wound Dressing Wound #1 Right Lower Leg o Prisma Ag Wound #3 Left Axilla o Iodoform packing  Gauze Secondary Dressing Wound #1 Right Lower Leg o Dry Gauze o Boardered Foam Dressing - Covellette bandaid Wound #3 Left Axilla o Dry Gauze o Boardered Foam Dressing - Covellette bandaid Dressing Change Frequency Wound #1 Right Lower Leg o Change dressing every other day. SOLIMAN, PULEIO ([id number]) Wound #3 Left Axilla o Change dressing every other day. Follow-up Appointments Wound #1 Right Lower Leg o Return Appointment in 1 week. Edema Control Wound #1 Right Lower Leg o Patient to wear own compression stockings o Elevate legs to the level of the heart and pump ankles as often as possible Wound #3 Left Axilla o Patient to wear own compression stockings o Elevate legs to the level of the heart and pump ankles as often as possible Additional Orders / Instructions Wound #1 Right Lower Leg o Stop Smoking o Activity as tolerated Wound #3 Left Axilla o Stop Smoking o Activity as tolerated Medications-please add to medication list. Wound #1 Right Lower Leg o P.O. Antibiotics - Doxy 100mg  BID x 14 days Wound #3 Left Axilla o P.O. Antibiotics - Doxy 100mg  BID x 14 days Laboratory o Bacteria identified in Wound by Culture (MICRO) - left axillary oooo LOINC Code: [id number] oooo Convenience Name: Wound culture routine Patient Medications Allergies: No Known Drug Allergies Notifications Medication Indication Start End doxycycline hyclate infection 07/31/2016 DOSE BID - oral 100 mg capsule - BID capsule oral for 14 days; take with full glass of water RYLANN, CRISTINA ([id number]) Electronic Signature(s) Signed: 07/31/2016 5:18:59 PM By: Regan Lemming BSN, RN Signed: 08/01/2016 12:17:12 PM By: Rene Kocher, NP, Ahmadou Bolz Previous Signature: 07/31/2016 1:23:11 PM Version By: Rene Kocher, NP, Alpha Chouinard Entered By: Regan Lemming on 07/31/2016 13:33:57 Swanger, Mora Appl  ([id number]) -------------------------------------------------------------------------------- Prescription 07/31/2016 Patient Name: [name] Physician: Lawanda Cousins NP Date of Birth: Jun 04, 1956 NPI#: [id number] Sex: M DEA#: [id number] Phone #: [phone] License #: Patient Address: [address] [place], [place] [address] [facility] [address], [place], [place] [address] [phone] Allergies No Known Drug Allergies Medication Medication: Route: Strength: Form: doxycycline hyclate 100 mg oral 100 mg capsule capsule Class: TETRACYCLINES Dose: Frequency / Time: Indication: BID BID capsule oral for 14 days; take with full glass of infection water Number of Refills: Number of Units: 0 Twenty Eight (28) Capsule(s) Generic Substitution: Start Date: End Date: One Time Use: Substitution Permitted [id number] No Note to Pharmacy: Signature(s): Date(s): AYMAN, VICARY ([id number]) Electronic Signature(s) Signed: 07/31/2016 5:18:59 PM By: Regan Lemming BSN, RN Signed:  08/01/2016 12:17:12 PM By: Rene Kocher, NP, Lucita Montoya Entered By: Regan Lemming on 07/31/2016 13:33:57 Dauphin, Mora Appl ([id number]) --------------------------------------------------------------------------------  Problem List Details Patient Name: [name] Date of Service: 07/31/2016 12:30 PM Medical Record Number: [id number] Patient Account Number: [id number] Date of Birth/Sex: 1956-02-26 (60 y.o. 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 Inactive Problems Resolved Problems ICD-10 Code Description Active Date Resolved Date S81.812A Laceration without foreign body, left lower leg, initial 06/16/2016 06/16/2016 encounter Electronic Signature(s) Signed: 07/31/2016 1:31:59 PM By: Rene Kocher, NP, Erik Nessel Entered By: Rene Kocher, NP, Matea Stanard on 07/31/2016 13:31:59 Molden, Mora Appl ([id number]) -------------------------------------------------------------------------------- Progress Note Details Patient Name: [name] Date of Service: 07/31/2016 12:30 PM Medical Record Number: [id number] Patient Account Number: [id number] Date of Birth/Sex: [birth year] (60 y.o. Male) Treating RN: Baruch Gouty, RN, BSN, [place] Primary Care Physician: [name] Other Clinician: Referring Physician: [name] Treating Physician/Extender: Cathie Olden in Treatment: 6 Subjective Chief Complaint Information obtained from Patient Patients presents for treatment of an open diabetic ulcer and a recent laceration both lower extremities which happened on 06/05/2016 History of Present Illness (HPI) The following HPI elements were documented for the patient's wound: Location: bilateral lower extremity lacerations with staples intact on the right lower extremity Quality: Patient reports experiencing a dull pain to affected area(s). Severity: Patient states wound (s) are getting better. Duration: Patient has had the wound for < 2 weeks prior to presenting for treatment Timing: Pain in wound is Intermittent (comes and goes Context: The wound occurred when the patient had lacerations with a metal sheet which had to be repaired in the ER Modifying Factors: Other treatment(s) tried include:as noted he has staples on the right lower extremity and has been on 2 courses of antibiotics Associated Signs and Symptoms: Patient  reports having increase swelling. 60 year old gentleman who is a patient of Dr. Nash Shearer and was seen by him recently on November 1 for a history of having scraped both legs with a piece of metal and was seen in the ER and the right leg was closed with staples. He was placed on Keflex and. Dr. Loanne Drilling treated his diabetes appropriately and also prescribed doxycycline to be taken. 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 he is status post axillary hidradenitis excision, cataract extraction,laser ablation of condyloma, hemorrhoid surgery, incision and drainage of abscess, transurethral resection of bladder tumor. he smokes a pack of cigarettes every day. the patient has been seen by Dr. Fletcher Anon in the past and he has had a workup done which was remote in 2015 which showed the right side showed a ABI of 0.44 and the left was normal. 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 No intervention was done and he was put on Cilostazol. Not find any electronic records of recent tests done since August 2015. 06/23/2016 -- the patient continues  to smoke half a pack of cigarettes a day and is working on giving up completely. 07/07/2016 -- he is using medihoney instead of Santyl as it was not affordable. ZYMEER, HEWITT ([id number]) 07-31-2016- Mr. Drennon returns for evaluation of his right lower extremity ulcer. He has been using Medi honey. He continues to smoke, but is down to approximately 7 cigarettes a day. He has plans to have complete cessation by the new year as he has a scheduled surgical procedure to his face in January. He voices no complaints to his right  lower extremity wound. He does present today with an abscess to his left axillary region. He has purchased compression stockings (15-20 mmHg), attempted to wear them on his left lower extremity and was unable to tolerate. Objective Constitutional BP within normal limits. afebrile. well nourished; well developed; appears stated age;Marland Kitchen Vitals Time Taken: 12:56 PM, Height: 68 in, Weight: 202 lbs, BMI: 30.7, Temperature: 98.0 F, Pulse: 78 bpm, Respiratory Rate: 16 breaths/min, Blood Pressure: 140/72 mmHg. Respiratory non-labored respiratory effort. Cardiovascular non-palpable DP and PT. warm extremities; no edema present; no discoloration; cap refill less than or equal to 3 seconds. Psychiatric appears to make sound judgement and have accurate insight regarding healthcare. oriented to time, place, person and situation. calm, pleasant, conversive. Integumentary (Hair, Skin) RLE - anterior ulcer is noted to have a large amount of granulation tissue with a minimal amount of biofilm/slough, no peri-wound erythema, no thermal changes; left axillary region reveals a large fluctuant, red abscess, very painful to palpation, warm to touch, no lymphangitis. Wound #1 status is Open. Original cause of wound was Trauma. The wound is located on the Right Lower Leg. The wound measures 4.2cm length x 0.8cm width x 0.2cm depth; 2.639cm^2 area and 0.528cm^3 volume. The wound is limited to skin breakdown. There is no tunneling or undermining noted. There is a large amount of sanguinous drainage noted. The wound margin is flat and intact. There is medium (34- 66%) pink granulation within the wound bed. There is a medium (34-66%) amount of necrotic tissue within the wound bed including Eschar and Adherent Slough. The periwound skin appearance exhibited: Induration, Scarring, Dry/Scaly. The periwound skin appearance did not exhibit: Callus, Crepitus, Excoriation, Fluctuance, Friable, Localized Edema, Rash,  Maceration, Moist, Atrophie Blanche, Cyanosis, Ecchymosis, Hemosiderin Staining, Mottled, Pallor, Rubor, Erythema. Periwound temperature was noted as No Abnormality. The periwound has tenderness on palpation. ROSHAWN, BARBIN ([id number]) Wound #3 status is Open. Original cause of wound was Pimple. The wound is located on the Left Axilla. The wound measures 1cm length x 0.3cm width x 6cm depth; 0.236cm^2 area and 1.414cm^3 volume. The wound is limited to skin breakdown. There is no tunneling or undermining noted. There is a large amount of serosanguineous drainage noted. The wound margin is distinct with the outline attached to the wound base. There is medium (34-66%) pink granulation within the wound bed. There is no necrotic tissue within the wound bed. The periwound skin appearance exhibited: Moist. The periwound skin appearance did not exhibit: Callus, Crepitus, Excoriation, Fluctuance, Friable, Induration, Localized Edema, Rash, Scarring, Dry/Scaly, Maceration, Atrophie Blanche, Cyanosis, Ecchymosis, Hemosiderin Staining, Mottled, Pallor, Rubor, Erythema. Procedures Wound #1 Wound #1 is a Diabetic Wound/Ulcer of the Lower Extremity located on the Right Lower Leg . There was a Skin/Subcutaneous Tissue Debridement [id number]) debridement with total area of 3.36 sq cm performed by Lawanda Cousins, NP. with the following instrument(s): Curette to remove Non-Viable tissue/material including Fibrin/Slough and Subcutaneous after achieving pain control using Lidocaine 4%  Topical Solution. A time out was conducted at 13:05, prior to the start of the procedure. A Minimum amount of bleeding was controlled with Pressure. The procedure was tolerated well with a pain level of 0 throughout and a pain level of 0 following the procedure. Post Debridement Measurements: 4.2cm length x 0.8cm width x 0.2cm depth; 0.528cm^3 volume. Character of Wound/Ulcer Post Debridement is stable. Severity of Tissue Post  Debridement is: Fat layer exposed. Post procedure Diagnosis Wound #1: Same as Pre-Procedure Wound #3 Wound #3 is an Abscess located on the Left Axilla . Abscess incision and drainage was provided by Lawanda Cousins, NP. The skin was cleansed and prepped with anti-septic followed by pain control using Lidocaine 4% Topical Solution. An incision was made in the left axillary with the following instrument(s): Curette. There was an immediate release of Sero-Sanguineous fluid. A Minimum amount of bleeding was controlled with Pressure. A time out was conducted at 13:15, prior to the start of the procedure. Swab culture was sent. The procedure was tolerated well with a pain level of 0 throughout and a pain level of 0 following the procedure. Post procedure Diagnosis Wound #3: Same as Pre-Procedure left axillary abscess was cleansed with Betadine, Betadine was given time to dry and at that time the abscess was incised and drained of a moderate to large amount of purulence, a culture was obtained but we will prescribe doxycycline and change as directed by culture and sensitivity HARVEL, EISENMAN. ([id number]) Plan Wound Cleansing: Wound #1 Right Lower Leg: Clean wound with Normal Saline. May shower with protection. Wound #3 Left Axilla: Clean wound with Normal Saline. May shower with protection. Anesthetic: Wound #1 Right Lower Leg: Topical Lidocaine 4% cream applied to wound bed prior to debridement - in office only Wound #3 Left Axilla: Topical Lidocaine 4% cream applied to wound bed prior to debridement - in office only Primary Wound Dressing: Wound #1 Right Lower Leg: Prisma Ag Wound #3 Left Axilla: Iodoform packing Gauze Secondary Dressing: Wound #1 Right Lower Leg: Dry Gauze Boardered Foam Dressing - Covellette bandaid Wound #3 Left Axilla: Dry Gauze Boardered Foam Dressing - Covellette bandaid Dressing Change Frequency: Wound #1 Right Lower Leg: Change dressing every other  day. Wound #3 Left Axilla: Change dressing every other day. Follow-up Appointments: Wound #1 Right Lower Leg: Return Appointment in 1 week. Edema Control: Wound #1 Right Lower Leg: Patient to wear own compression stockings Elevate legs to the level of the heart and pump ankles as often as possible Wound #3 Left Axilla: Patient to wear own compression stockings Elevate legs to the level of the heart and pump ankles as often as possible Additional Orders / Instructions: Wound #1 Right Lower Leg: Stop Smoking Activity as tolerated Wound #3 Left Axilla: Stop Smoking Activity as tolerated Medications-please add to medication list.: MARGARITA, JAVED ([id number]) Wound #1 Right Lower Leg: P.O. Antibiotics - Doxy 100mg  BID x 14 days Wound #3 Left Axilla: P.O. Antibiotics - Doxy 100mg  BID x 14 days The following medication(s) was prescribed: doxycycline hyclate oral 100 mg capsule BID BID capsule oral for 14 days; take with full glass of water for infection starting 07/31/2016 1. Will switch from Medi Honey to Prisma, change every other day 2. Iodoform packing to left axillary abscess, change daily 3. Continue with smoking cessation 4. Follow-up next week 5. Contact the wound care center and/or be seen by urgent care or the emergency room if the left axillary abscess has noted increased pain, drainage, erythema or  lymphangitis Electronic Signature(s) Signed: 07/31/2016 2:53:03 PM By: Rene Kocher, NP, Torian Thoennes Previous Signature: 07/31/2016 1:31:03 PM Version By: Rene Kocher, NP, Jana Hakim By: Rene Kocher, NP, Pleasant Bensinger on 07/31/2016 14:53:03 Awbrey, Mora Appl ([id number]) -------------------------------------------------------------------------------- SuperBill Details Patient Name: [name] Date of Service: 07/31/2016 Medical Record Number: [id number] Patient Account Number: [id number] Date of Birth/Sex: May 14, 1956 (60 y.o. Male) Treating RN: Baruch Gouty, RN, BSN, Velva Harman Primary Care Physician: [name] Other Clinician: Referring Physician: [name] Treating Physician/Extender: Cathie Olden in Treatment: 6 Diagnosis Coding ICD-10 Codes Code Description E11.621 Type 2 diabetes mellitus with foot ulcer S81.811A Laceration without foreign body, right lower leg, initial encounter L02.412 Cutaneous abscess of left axilla F17.218 Nicotine dependence, cigarettes, with other nicotine-induced disorders Facility Procedures CPT4 Code Description: [id number] - DEBRIDE WOUND 1ST 20 SQ CM OR < ICD-10 Description Diagnosis E11.621 Type 2 diabetes mellitus with foot ulcer S81.811A Laceration without foreign body, right lower leg, init Modifier: ial encounte Quantity: 1 r CPT4 Code Description: [id number] - I and D Abscess; simple/single ICD-10 Description Diagnosis L02.412 Cutaneous abscess of left axilla Modifier: Quantity: 1 Physician Procedures CPT4 Code Description: [id number] - WC PHYS DEBR WO ANESTH 20 SQ CM ICD-10 Description Diagnosis E11.621 Type 2 diabetes mellitus with foot ulcer S81.811A Laceration without foreign body, right lower leg, init Modifier: ial encounte Quantity: 1 r CPT4 Code Description: [id number] - I and D Abscess; simple/single ICD-10 Description Diagnosis L02.412 Cutaneous abscess of left axilla GIAVONNI, HEADLEE ([id number]) Modifier: Quantity: 1 Electronic Signature(s) Signed: 07/31/2016 1:33:32 PM By: Rene Kocher, NP, Mykell Genao Entered By: Rene Kocher, NP, Breydon Senters on 07/31/2016 13:33:32

## 2016-08-04 LAB — AEROBIC CULTURE W GRAM STAIN (SUPERFICIAL SPECIMEN)

## 2016-08-04 LAB — AEROBIC CULTURE  (SUPERFICIAL SPECIMEN)

## 2016-08-05 ENCOUNTER — Telehealth: Payer: Self-pay | Admitting: Endocrinology

## 2016-08-05 NOTE — Telephone Encounter (Signed)
OK 

## 2016-08-05 NOTE — Telephone Encounter (Signed)
Pt needs the hydrocodone rx, would you please rx in Dr. Cordelia Pen absence?

## 2016-08-07 ENCOUNTER — Encounter: Payer: Medicare Other | Admitting: Surgery

## 2016-08-07 DIAGNOSIS — Z85828 Personal history of other malignant neoplasm of skin: Secondary | ICD-10-CM | POA: Diagnosis not present

## 2016-08-07 DIAGNOSIS — I10 Essential (primary) hypertension: Secondary | ICD-10-CM | POA: Diagnosis not present

## 2016-08-07 DIAGNOSIS — E11621 Type 2 diabetes mellitus with foot ulcer: Secondary | ICD-10-CM | POA: Diagnosis not present

## 2016-08-07 DIAGNOSIS — L97812 Non-pressure chronic ulcer of other part of right lower leg with fat layer exposed: Secondary | ICD-10-CM | POA: Diagnosis not present

## 2016-08-07 DIAGNOSIS — Z794 Long term (current) use of insulin: Secondary | ICD-10-CM | POA: Diagnosis not present

## 2016-08-07 DIAGNOSIS — S81812A Laceration without foreign body, left lower leg, initial encounter: Secondary | ICD-10-CM | POA: Diagnosis not present

## 2016-08-07 DIAGNOSIS — S81811A Laceration without foreign body, right lower leg, initial encounter: Secondary | ICD-10-CM | POA: Diagnosis not present

## 2016-08-07 DIAGNOSIS — E11622 Type 2 diabetes mellitus with other skin ulcer: Secondary | ICD-10-CM | POA: Diagnosis not present

## 2016-08-07 DIAGNOSIS — Z8551 Personal history of malignant neoplasm of bladder: Secondary | ICD-10-CM | POA: Diagnosis not present

## 2016-08-07 DIAGNOSIS — E1140 Type 2 diabetes mellitus with diabetic neuropathy, unspecified: Secondary | ICD-10-CM | POA: Diagnosis not present

## 2016-08-08 ENCOUNTER — Telehealth: Payer: Self-pay

## 2016-08-08 ENCOUNTER — Other Ambulatory Visit: Payer: Self-pay | Admitting: Internal Medicine

## 2016-08-08 ENCOUNTER — Telehealth: Payer: Self-pay | Admitting: Endocrinology

## 2016-08-08 MED ORDER — HYDROCODONE-ACETAMINOPHEN 5-325 MG PO TABS: 1.0000 | ORAL_TABLET | Freq: Four times a day (QID) | ORAL | 0 refills | Status: DC | PRN

## 2016-08-08 NOTE — Progress Notes (Addendum)
LEIM, DIANTONIO ([id number]) Visit Report for 08/07/2016 Arrival Information Details Patient Name: [name], [name] Date of Service: 08/07/2016 3:15 PM Medical Record Number: [id number] Patient Account Number: [id number] Date of Birth/Sex: 05-Apr-1956 (60 y.o. Male) Treating RN: Baruch Gouty, RN, BSN, Velva Harman Primary Care Physician: [name] Other Clinician: Referring Physician: [name] Treating Physician/Extender: Frann Rider in Treatment: 7 Visit Information History Since Last Visit All ordered tests and consults were completed: No Patient Arrived: Ambulatory Added or deleted any medications: No Arrival Time: 15:02 Any new allergies or adverse reactions: No Accompanied By: self Had a fall or experienced change in No Transfer Assistance: None activities of daily living that may affect Patient Identification Verified: Yes risk of falls: Secondary Verification Process Yes Signs or symptoms of abuse/neglect since last No Completed: visito Patient Has Alerts: Yes Hospitalized since last visit: No Patient Alerts: Patient on Blood Has Dressing in Place as Prescribed: Yes Thinner Pain Present Now: No DM Type II Electronic Signature(s) Signed: 08/07/2016 3:02:52 PM By: Regan Lemming BSN, RN Entered By: Regan Lemming on 08/07/2016 15:02:52 [name] ([id number]) -------------------------------------------------------------------------------- Encounter Discharge Information Details Patient Name: [name] Date of Service: 08/07/2016 3:15 PM Medical Record Number: [id number] Patient Account Number: [id number] Date of Birth/Sex: [birth year] (60 y.o. Male) Treating RN: Baruch Gouty, RN, BSN, Velva Harman Primary Care Physician: [name] Other Clinician: Referring Physician: [name] Treating Physician/Extender: Frann Rider in Treatment: 7 Encounter Discharge Information Items Discharge Pain Level: 0 Discharge Condition: Stable Ambulatory Status:  Ambulatory Discharge Destination: Home Transportation: Private Auto Accompanied By: wife Schedule Follow-up Appointment: No Medication Reconciliation completed and provided to Patient/Care No Ayjah Show: Provided on Clinical Summary of Care: 08/07/2016 Form Type Recipient Paper Patient TW Electronic Signature(s) Signed: 08/07/2016 3:33:09 PM By: Ruthine Dose Entered By: Ruthine Dose on 08/07/2016 15:33:09 [name], [name] ([id number]) -------------------------------------------------------------------------------- Lower Extremity Assessment Details Patient Name: [name] Date of Service: 08/07/2016 3:15 PM Medical Record Number: [id number] Patient Account Number: [id number] Date of Birth/Sex: 1956-02-13 (60 y.o. Male) Treating RN: Baruch Gouty, RN, BSN, Velva Harman Primary Care Physician: [name] Other Clinician: Referring Physician: [name] Treating Physician/Extender: Frann Rider in Treatment: 7 Edema Assessment Assessed: [Left: No] [Right: No] Edema: [Left: N] [Right: o] Calf Left: Right: Point of Measurement: 34 cm From Medial Instep cm 38.6 cm Ankle Left: Right: Point of Measurement: 12 cm From Medial Instep cm 22.6 cm Vascular Assessment Claudication: Claudication Assessment [Right:None] Pulses: Dorsalis Pedis Palpable: [Right:Yes] Posterior Tibial Extremity colors, hair growth, and conditions: Extremity Color: [Right:Normal] Hair Growth on Extremity: [Right:Yes] Temperature of Extremity: [Right:Warm] Capillary Refill: [Right:< 3 seconds] Electronic Signature(s) Signed: 08/07/2016 3:53:58 PM By: Regan Lemming BSN, RN Entered By: Regan Lemming on 08/07/2016 15:53:58 [name], [name] ([id number]) -------------------------------------------------------------------------------- Multi Wound Chart Details Patient Name: [name] Date of Service: 08/07/2016 3:15 PM Medical Record Number: [id number] Patient Account Number: [id number] Date of Birth/Sex:  Aug 30, 1955 (60 y.o. Male) Treating RN: Baruch Gouty, RN, BSN, [place] Primary Care Physician: [name] Other Clinician: Referring Physician: [name] Treating Physician/Extender: Frann Rider in Treatment: 7 Vital Signs Height(in): 68 Pulse(bpm): 79 Weight(lbs): 202 Blood Pressure 147/79 (mmHg): Body Mass Index(BMI): 31 Temperature(F): 97.8 Respiratory Rate 16 (breaths/min): Photos: [1:No Photos] [3:No Photos] [N/A:N/A] Wound Location: [1:Right Lower Leg] [3:Left Axilla] [N/A:N/A] Wounding Event: [1:Trauma] [3:Pimple] [N/A:N/A] Primary Etiology: [1:Diabetic Wound/Ulcer of the Lower Extremity] [3:Abscess] [N/A:N/A] Secondary Etiology: [1:Trauma, Other] [3:N/A] [N/A:N/A] Comorbid History: [1:Cataracts, Deep Vein Thrombosis, Hypertension, Type II Diabetes, Neuropathy] [3:Cataracts, Deep Vein Thrombosis, Hypertension, Type II  Diabetes, Neuropathy] [N/A:N/A] Date Acquired: [1:06/05/2016] [3:07/31/2016] [N/A:N/A] Weeks of Treatment: [1:7] [3:1] [N/A:N/A] Wound Status: [1:Open] [3:Healed - Epithelialized] [N/A:N/A] Measurements L x W x D 2.9x0.3x0.2 [3:0x0x0] [N/A:N/A] (cm) Area (cm) : [1:0.683] [3:0] [N/A:N/A] Volume (cm) : [1:0.137] [3:0] [N/A:N/A] % Reduction in Area: [1:93.30%] [3:100.00%] [N/A:N/A] % Reduction in Volume: 86.60% [3:100.00%] [N/A:N/A] Classification: [1:Grade 1] [3:Full Thickness Without Exposed Support Structures] [N/A:N/A] Exudate Amount: [1:Medium] [3:None Present] [N/A:N/A] Exudate Type: [1:Sanguinous] [3:N/A] [N/A:N/A] Exudate Color: [1:red] [3:N/A] [N/A:N/A] Wound Margin: [1:Flat and Intact] [3:Distinct, outline attached] [N/A:N/A] Granulation Amount: [1:Medium (34-66%)] [3:Medium (34-66%)] [N/A:N/A] Granulation Quality: [1:Pink] [3:Pink] [N/A:N/A] Necrotic Amount: [1:Medium (34-66%)] [3:None Present (0%)] [N/A:N/A] Necrotic Tissue: [1:Eschar, Adherent Slough N/A] [N/A:N/A] Exposed Structures: Fascia: No Fascia: No N/A Fat: No Fat:  No Tendon: No Tendon: No Muscle: No Muscle: No Joint: No Joint: No Bone: No Bone: No Limited to Skin Limited to Skin Breakdown Breakdown Epithelialization: Medium (34-66%) Large (67-100%) N/A Periwound Skin Texture: Induration: Yes Edema: No N/A Scarring: Yes Excoriation: No Edema: No Induration: No Excoriation: No Callus: No Callus: No Crepitus: No Crepitus: No Fluctuance: No Fluctuance: No Friable: No Friable: No Rash: No Rash: No Scarring: No Periwound Skin Dry/Scaly: Yes Dry/Scaly: Yes N/A Moisture: Maceration: No Maceration: No Moist: No Moist: No Periwound Skin Color: Atrophie Blanche: No Atrophie Blanche: No N/A Cyanosis: No Cyanosis: No Ecchymosis: No Ecchymosis: No Erythema: No Erythema: No Hemosiderin Staining: No Hemosiderin Staining: No Mottled: No Mottled: No Pallor: No Pallor: No Rubor: No Rubor: No Temperature: No Abnormality No Abnormality N/A Tenderness on Yes No N/A Palpation: Wound Preparation: Ulcer Cleansing: Ulcer Cleansing: N/A Rinsed/Irrigated with Rinsed/Irrigated with Saline Saline Topical Anesthetic Topical Anesthetic Applied: Other: lidocaine Applied: None 4% Treatment Notes Electronic Signature(s) Signed: 08/07/2016 4:00:14 PM By: Regan Lemming BSN, RN Entered By: Regan Lemming on 08/07/2016 15:15:55 [name], [name] ([id number]) -------------------------------------------------------------------------------- [place] Details Patient Name: [name] Date of Service: 08/07/2016 3:15 PM Medical Record Number: [id number] Patient Account Number: [id number] Date of Birth/Sex: May 19, 1956 (60 y.o. Male) Treating RN: Baruch Gouty, RN, BSN, Velva Harman Primary Care Physician: [name] Other Clinician: Referring Physician: [name] Treating Physician/Extender: Frann Rider in Treatment: 7 Active Inactive Necrotic Tissue Nursing Diagnoses: Impaired tissue integrity related to necrotic/devitalized  tissue Knowledge deficit related to management of necrotic/devitalized tissue Goals: Necrotic/devitalized tissue will be minimized in the wound bed Date Initiated: 06/23/2016 Goal Status: Active Patient/caregiver will verbalize understanding of reason and process for debridement of necrotic tissue Date Initiated: 06/23/2016 Goal Status: Active Interventions: Assess patient pain level pre-, during and post procedure and prior to discharge Provide education on necrotic tissue and debridement process Treatment Activities: Apply topical anesthetic as ordered : 06/23/2016 Notes: Nutrition Nursing Diagnoses: Impaired glucose control: actual or potential Goals: Patient/caregiver agrees to and verbalizes understanding of need to obtain nutritional consultation Date Initiated: 06/16/2016 Goal Status: Active Interventions: Assess HgA1c results as ordered upon admission and as needed Notes: [name], [name] ([id number]) Orientation to the Wound Care Program Nursing Diagnoses: Knowledge deficit related to the wound healing center program Goals: Patient/caregiver will verbalize understanding of the [facility] Program Date Initiated: 06/16/2016 Goal Status: Active Interventions: Provide education on orientation to the wound center Notes: Wound/Skin Impairment Nursing Diagnoses: Impaired tissue integrity Goals: Ulcer/skin breakdown will heal within 14 weeks Date Initiated: 06/16/2016 Goal Status: Active Interventions: Assess patient/caregiver ability to obtain necessary supplies Treatment Activities: Refer to smoking cessation program : 06/16/2016 Notes: Electronic Signature(s) Signed: 08/07/2016 4:00:14 PM By: Regan Lemming BSN, RN Entered  By: Regan Lemming on 08/07/2016 15:15:46 [name], [name] ([id number]) -------------------------------------------------------------------------------- Pain Assessment Details Patient Name: [name], [name] Date of Service: 08/07/2016 3:15  PM Medical Record Number: [id number] Patient Account Number: [id number] Date of Birth/Sex: [birth year] (60 y.o. Male) Treating RN: Baruch Gouty, RN, BSN, [place] Primary Care Physician: [name] Other Clinician: Referring Physician: [name] Treating Physician/Extender: Frann Rider in Treatment: 7 Active Problems Location of Pain Severity and Description of Pain Patient Has Paino No Site Locations With Dressing Change: No Pain Management and Medication Current Pain Management: Electronic Signature(s) Signed: 08/07/2016 4:00:14 PM By: Regan Lemming BSN, RN Entered By: Regan Lemming on 08/07/2016 15:04:21 [name] ([id number]) -------------------------------------------------------------------------------- Patient/Caregiver Education Details Patient Name: [name] Date of Service: 08/07/2016 3:15 PM Medical Record Number: [id number] Patient Account Number: [id number] Date of Birth/Gender: 19-May-1956 (60 y.o. Male) Treating RN: Baruch Gouty, RN, BSN, [place] Primary Care Physician: [name] Other Clinician: Referring Physician: [name] Treating Physician/Extender: Frann Rider in Treatment: 7 Education Assessment Education Provided To: Patient Education Topics Provided Welcome To [facility]: Methods: Explain/Verbal Responses: State content correctly Wound Debridement: Methods: Explain/Verbal Responses: State content correctly Wound/Skin Impairment: Methods: Explain/Verbal Responses: State content correctly Electronic Signature(s) Signed: 08/07/2016 4:00:14 PM By: Regan Lemming BSN, RN Entered By: Regan Lemming on 08/07/2016 15:21:03 [name] ([id number]) -------------------------------------------------------------------------------- Wound Assessment Details Patient Name: [name] Date of Service: 08/07/2016 3:15 PM Medical Record Number: [id number] Patient Account Number: [id number] Date of Birth/Sex: Dec 27, 1955 (60 y.o.  Male) Treating RN: Baruch Gouty, RN, BSN, [place] [facility] Primary Care Physician: [name] Other Clinician: Referring Physician: [name] Treating Physician/Extender: Frann Rider in Treatment: 7 Wound Status Wound Number: 1 Primary Diabetic Wound/Ulcer of the Lower Etiology: Extremity Wound Location: Right Lower Leg Secondary Trauma, Other Wounding Event: Trauma Etiology: Date Acquired: 06/05/2016 Wound Open Weeks Of Treatment: 7 Status: Clustered Wound: No Comorbid Cataracts, Deep Vein Thrombosis, History: Hypertension, Type II Diabetes, Neuropathy Photos Photo Uploaded By: Regan Lemming on 08/07/2016 15:59:59 Wound Measurements Length: (cm) 2.9 Width: (cm) 0.3 Depth: (cm) 0.2 Area: (cm) 0.683 Volume: (cm) 0.137 % Reduction in Area: 93.3% % Reduction in Volume: 86.6% Epithelialization: Medium (34-66%) Tunneling: No Undermining: No Wound Description Classification: Grade 1 Wound Margin: Flat and Intact [name], Paul W. ([id number]) Foul Odor After Cleansing: No Exudate Amount: Medium Exudate Type: Sanguinous Exudate Color: red Wound Bed Granulation Amount: Medium (34-66%) Exposed Structure Granulation Quality: Pink Fascia Exposed: No Necrotic Amount: Medium (34-66%) Fat Layer Exposed: No Necrotic Quality: Eschar, Adherent Slough Tendon Exposed: No Muscle Exposed: No Joint Exposed: No Bone Exposed: No Limited to Skin Breakdown Periwound Skin Texture Texture Color No Abnormalities Noted: No No Abnormalities Noted: No Callus: No Atrophie Blanche: No Crepitus: No Cyanosis: No Excoriation: No Ecchymosis: No Fluctuance: No Erythema: No Friable: No Hemosiderin Staining: No Induration: Yes Mottled: No Localized Edema: No Pallor: No Rash: No Rubor: No Scarring: Yes Temperature / Pain Moisture Temperature: No Abnormality No Abnormalities Noted: No Tenderness on Palpation: Yes Dry / Scaly: Yes Maceration: No Moist: No Wound Preparation Ulcer  Cleansing: Rinsed/Irrigated with Saline Topical Anesthetic Applied: Other: lidocaine 4%, Treatment Notes Wound #1 (Right Lower Leg) 1. Cleansed with: Clean wound with Normal Saline 4. Dressing Applied: Prisma Ag 5. Secondary Dressing Applied Bordered Foam Dressing Dry Gauze Electronic Signature(s) [name], [name] ([id number]) Signed: 08/07/2016 4:00:14 PM By: Regan Lemming BSN, RN Entered By: Regan Lemming on 08/07/2016 15:12:13 [name], [name] ([id number]) -------------------------------------------------------------------------------- Wound Assessment Details Patient Name: [name]. Date  of Service: 08/07/2016 3:15 PM Medical Record Number: [id number] Patient Account Number: [id number] Date of Birth/Sex: 07-15-1956 (60 y.o. Male) Treating RN: Baruch Gouty, RN, BSN, [place] Primary Care Physician: [name] Other Clinician: Referring Physician: [name] Treating Physician/Extender: Frann Rider in Treatment: 7 Wound Status Wound Number: 3 Primary Abscess Etiology: Wound Location: Left Axilla Wound Healed - Epithelialized Wounding Event: Pimple Status: Date Acquired: 07/31/2016 Comorbid Cataracts, Deep Vein Thrombosis, Weeks Of Treatment: 1 History: Hypertension, Type II Diabetes, Clustered Wound: No Neuropathy Photos Photo Uploaded By: Regan Lemming on 08/07/2016 16:00:00 Wound Measurements Length: (cm) 0 % Reduction in Width: (cm) 0 % Reduction in Depth: (cm) 0 Epithelializat Area: (cm) 0 Tunneling: Volume: (cm) 0 Undermining: Area: 100% Volume: 100% ion: Large (67-100%) No No Wound Description Full Thickness Without Exposed Classification: Support Structures Wound Margin: Distinct, outline attached Exudate None Present Amount: Foul Odor After Cleansing: No Wound Bed Granulation Amount: Medium (34-66%) Exposed Structure Granulation Quality: Pink Fascia Exposed: No Necrotic Amount: None Present (0%) Fat Layer Exposed: No Tendon Exposed:  No KHOI, LOMBARDI ([id number]) Muscle Exposed: No Joint Exposed: No Bone Exposed: No Limited to Skin Breakdown Periwound Skin Texture Texture Color No Abnormalities Noted: No No Abnormalities Noted: No Callus: No Atrophie Blanche: No Crepitus: No Cyanosis: No Excoriation: No Ecchymosis: No Fluctuance: No Erythema: No Friable: No Hemosiderin Staining: No Induration: No Mottled: No Localized Edema: No Pallor: No Rash: No Rubor: No Scarring: No Temperature / Pain Moisture Temperature: No Abnormality No Abnormalities Noted: No Dry / Scaly: Yes Maceration: No Moist: No Wound Preparation Ulcer Cleansing: Rinsed/Irrigated with Saline Topical Anesthetic Applied: None Electronic Signature(s) Signed: 08/07/2016 4:00:14 PM By: Regan Lemming BSN, RN Entered By: Regan Lemming on 08/07/2016 15:12:39 [name] ([id number]) -------------------------------------------------------------------------------- Vitals Details Patient Name: [name] Date of Service: 08/07/2016 3:15 PM Medical Record Number: [id number] Patient Account Number: [id number] Date of Birth/Sex: 1955-09-08 (60 y.o. Male) Treating RN: Baruch Gouty, RN, BSN, [place] Primary Care Physician: [name] Other Clinician: Referring Physician: [name] Treating Physician/Extender: Frann Rider in Treatment: 7 Vital Signs Time Taken: 15:04 Temperature (F): 97.8 Height (in): 68 Pulse (bpm): 79 Weight (lbs): 202 Respiratory Rate (breaths/min): 16 Body Mass Index (BMI): 30.7 Blood Pressure (mmHg): 147/79 Reference Range: 80 - 120 mg / dl Electronic Signature(s) Signed: 08/07/2016 4:00:14 PM By: Regan Lemming BSN, RN Entered By: Regan Lemming on 08/07/2016 15:06:36

## 2016-08-08 NOTE — Telephone Encounter (Signed)
Pt aware the rx is ready for pick up.

## 2016-08-08 NOTE — Telephone Encounter (Signed)
[name],   Pt is waiting in the lobby, please print the rx and have Dr. Cruzita Lederer sign and I will give it to him thank you!

## 2016-08-08 NOTE — Telephone Encounter (Signed)
Patient called two days ago, he need the hydrocodone, he really need it today.

## 2016-08-08 NOTE — Telephone Encounter (Signed)
Hydrocodone-acetaminophen 5-325 mg refill request

## 2016-08-09 NOTE — Progress Notes (Signed)
[name] ([id number]) Visit Report for 08/07/2016 Chief Complaint Document Details Patient Name: [name], [name] Date of Service: 08/07/2016 3:15 PM Medical Record Number: [id number] Patient Account Number: [id number] Date of Birth/Sex: [birth year] (60 y.o. Male) Treating RN: Baruch Gouty, RN, BSN, Velva Harman Primary Care Physician: [name] Other Clinician: Referring Physician: [name] Treating Physician/Extender: Frann Rider in Treatment: 7 Information Obtained from: Patient Chief Complaint Patients presents for treatment of an open diabetic ulcer and a recent laceration both lower extremities which happened on 06/05/2016 Electronic Signature(s) Signed: 08/07/2016 4:28:09 PM By: Christin Fudge MD, FACS Entered By: Christin Fudge on 08/07/2016 16:28:09 [name] ([id number]) -------------------------------------------------------------------------------- Debridement Details Patient Name: [name] Date of Service: 08/07/2016 3:15 PM Medical Record Number: [id number] Patient Account Number: [id number] Date of Birth/Sex: 1956-01-14 (60 y.o. Male) Treating RN: Baruch Gouty, RN, BSN, [place] Primary Care Physician: [name] Other Clinician: Referring Physician: [name] Treating Physician/Extender: Frann Rider in Treatment: 7 Debridement Performed for Wound #1 Right Lower Leg Assessment: Performed By: Physician Christin Fudge, MD Debridement: Debridement Pre-procedure Yes - 15:15 Verification/Time Out Taken: Start Time: 15:15 Pain Control: Lidocaine 4% Topical Solution Level: Skin/Subcutaneous Tissue Total Area Debrided (L x 2.9 (cm) x 0.3 (cm) = 0.87 (cm) W): Tissue and other Viable, Non-Viable, Eschar, Fibrin/Slough, Other, Skin, Subcutaneous material debrided: Instrument: Forceps Bleeding: Minimum Hemostasis Achieved: Pressure End Time: 15:17 Procedural Pain: 0 Post Procedural Pain: 0 Response to Treatment: Procedure was tolerated  well Post Debridement Measurements of Total Wound Length: (cm) 2.9 Width: (cm) 0.3 Depth: (cm) 0.2 Volume: (cm) 0.137 Character of Wound/Ulcer Post Improved Debridement: Severity of Tissue Post Debridement: Fat layer exposed Post Procedure Diagnosis Same as Pre-procedure Electronic Signature(s) Signed: 08/07/2016 4:30:57 PM By: Christin Fudge MD, FACS Signed: 08/08/2016 5:28:47 PM By: Regan Lemming BSN, RN Previous Signature: 08/07/2016 4:28:04 PM Version By: Christin Fudge MD, FACS Previous Signature: 08/07/2016 4:00:14 PM Version By: Regan Lemming BSN, RN [name] ([id number]) Entered By: Christin Fudge on 08/07/2016 16:30:57 [name], [name] ([id number]) -------------------------------------------------------------------------------- HPI Details Patient Name: [name] Date of Service: 08/07/2016 3:15 PM Medical Record Number: [id number] Patient Account Number: [id number] Date of Birth/Sex: 03/05/1956 (60 y.o. Male) Treating RN: Baruch Gouty, RN, BSN, [place] Primary Care Physician: [name] Other Clinician: Referring Physician: [name] Treating Physician/Extender: Frann Rider in Treatment: 7 History of Present Illness Location: bilateral lower extremity lacerations with staples intact on the right lower extremity Quality: Patient reports experiencing a dull pain to affected area(s). Severity: Patient states wound (s) are getting better. Duration: Patient has had the wound for < 2 weeks prior to presenting for treatment Timing: Pain in wound is Intermittent (comes and goes Context: The wound occurred when the patient had lacerations with a metal sheet which had to be repaired in the ER Modifying Factors: Other treatment(s) tried include:as noted he has staples on the right lower extremity and has been on 2 courses of antibiotics Associated Signs and Symptoms: Patient reports having increase swelling. HPI Description: 60 year old gentleman who is a patient of  Dr. Nash Shearer and was seen by him recently on November 1 for a history of having scraped both legs with a piece of metal and was seen in the ER and the right leg was closed with staples. He was placed on Keflex and. Dr. Loanne Drilling treated his diabetes appropriately and also prescribed doxycycline to be taken. 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 he is status post axillary hidradenitis excision, cataract extraction,laser ablation of condyloma, hemorrhoid surgery, incision and drainage of abscess, transurethral resection of bladder tumor. he smokes a pack of cigarettes every day. the patient has been seen by Dr. Fletcher Anon in the past and he has had a workup done which was remote in 2015 which showed the right side showed a ABI of 0.44 and the left was normal. 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 No intervention was done and he was put on Cilostazol. Not find any electronic records of recent tests done since August 2015. 06/23/2016 -- the patient continues to smoke half a pack of cigarettes a day and is working on giving up completely. 07/07/2016 -- he is using medihoney instead of Santyl as it was not affordable. 07-31-2016- Mr. Collard returns for evaluation of his right lower extremity ulcer. He has been using Medi honey. He continues to smoke, but is down to approximately 7 cigarettes a day. He has plans to have complete cessation by the new year as he has a scheduled surgical procedure to his face in January. He voices no complaints to his right lower extremity wound. He does present today with an abscess to his left axillary region. He has purchased  compression stockings (15-20 mmHg), attempted to wear them on his left lower extremity and was unable to tolerate. Electronic Signature(s) BARIN, SHIRAH ([id number]) Signed: 08/07/2016 4:28:25 PM By: Christin Fudge MD, FACS Entered By: Christin Fudge on 08/07/2016 [id number] KEMAURI, DUTY ([id number]) -------------------------------------------------------------------------------- Physical Exam Details Patient Name: [name] Date of Service: 08/07/2016 3:15 PM Medical Record Number: [id number] Patient Account Number: [id number] Date of Birth/Sex: 08-03-1956 (60 y.o. Male) Treating RN: Baruch Gouty, RN, BSN, Velva Harman Primary Care Physician: [name] Other Clinician: Referring Physician: [name] Treating Physician/Extender: Frann Rider in Treatment: 7 Constitutional . Pulse regular. Respirations normal and unlabored. Afebrile. . Eyes Nonicteric. Reactive to light. Ears, Nose, Mouth, and Throat Lips, teeth, and gums WNL.Marland Kitchen Moist mucosa without lesions. Neck supple and nontender. No palpable supraclavicular or cervical adenopathy. Normal sized without goiter. Respiratory WNL. No retractions.. Breath sounds WNL, No rubs, rales, rhonchi, or wheeze.. Cardiovascular Heart rhythm and rate regular, no murmur or gallop.. Pedal Pulses WNL. No clubbing, cyanosis or edema. Chest Breasts symmetical and no nipple discharge.. Breast tissue WNL, no masses, lumps, or tenderness.. Lymphatic No adneopathy. No adenopathy. No adenopathy. Musculoskeletal Adexa without tenderness or enlargement.. Digits and nails w/o clubbing, cyanosis, infection, petechiae, ischemia, or inflammatory conditions.. Integumentary (Hair, Skin) No suspicious lesions. No crepitus or fluctuance. No peri-wound warmth or erythema. No masses.Marland Kitchen Psychiatric Judgement and insight Intact.. No evidence of depression, anxiety, or agitation.. Notes the ulcerated area on the right lower extremity is looking much better and  after sharp debridement of the skin and subcutis tissue with a #3 curet minimal bleeding controlled with pressure. The left axillary abscess which was drained last week is completely closed and healed. Electronic Signature(s) Signed: 08/07/2016 4:28:55 PM By: Christin Fudge MD, FACS Entered By: Christin Fudge on 08/07/2016 16:28:55 [name] ([id number]) -------------------------------------------------------------------------------- Physician Orders Details Patient Name: [name] Date of Service: 08/07/2016 3:15 PM Medical Record Number: [id number] Patient Account Number: [id number] Date of Birth/Sex: [birth year] (60 y.o. Male) Treating RN: Baruch Gouty, RN, BSN, [facility] Primary Care Physician:  [name] Other Clinician: Referring Physician: [name] Treating Physician/Extender: Frann Rider in Treatment: 7 Verbal / Phone Orders: Yes Clinician: Afful, RN, BSN, Rita Read Back and Verified: Yes Diagnosis Coding Wound Cleansing Wound #1 Right Lower Leg o Clean wound with Normal Saline. o May shower with protection. Anesthetic Wound #1 Right Lower Leg o Topical Lidocaine 4% cream applied to wound bed prior to debridement - in office only Primary Wound Dressing Wound #1 Right Lower Leg o Prisma Ag Secondary Dressing Wound #1 Right Lower Leg o Dry Gauze o Boardered Foam Dressing - Covellette bandaid Dressing Change Frequency Wound #1 Right Lower Leg o Change dressing every other day. Follow-up Appointments Wound #1 Right Lower Leg o Return Appointment in 1 week. Edema Control Wound #1 Right Lower Leg o Patient to wear own compression stockings o Elevate legs to the level of the heart and pump ankles as often as possible Additional Orders / Instructions Wound #1 Right Lower Leg o Stop Smoking o Activity as tolerated [name], [name] ([id number]) Electronic Signature(s) Signed: 08/07/2016 4:00:14 PM By: Regan Lemming BSN, RN Signed:  08/07/2016 4:37:15 PM By: Christin Fudge MD, FACS Entered By: Regan Lemming on 08/07/2016 15:19:38 [name], [name] ([id number]) -------------------------------------------------------------------------------- Problem List Details Patient Name: [name] Date of Service: 08/07/2016 3:15 PM Medical Record Number: [id number] Patient Account Number: [id number] Date of Birth/Sex: 08/30/1955 (60 y.o. Male) Treating RN: Baruch Gouty, RN, BSN, [place] Primary Care Physician: [name] Other Clinician: Referring Physician: [name] Treating Physician/Extender: Frann Rider in Treatment: 7 Active Problems ICD-10 Encounter Code Description Active Date Diagnosis E11.621 Type 2 diabetes mellitus with foot ulcer 06/16/2016 Yes S81.811A Laceration without foreign body, right lower leg, initial 06/16/2016 Yes encounter L02.412 Cutaneous abscess of left axilla 07/31/2016 Yes F17.218 Nicotine dependence, cigarettes, with other nicotine- 06/16/2016 Yes induced disorders Inactive Problems Resolved Problems ICD-10 Code Description Active Date Resolved Date S81.812A Laceration without foreign body, left lower leg, initial 06/16/2016 06/16/2016 encounter Electronic Signature(s) Signed: 08/07/2016 4:27:32 PM By: Christin Fudge MD, FACS Entered By: Christin Fudge on 08/07/2016 16:27:32 [name] ([id number]) -------------------------------------------------------------------------------- Progress Note Details Patient Name: [name] Date of Service: 08/07/2016 3:15 PM Medical Record Number: [id number] Patient Account Number: [id number] Date of Birth/Sex: 07-18-1956 (60 y.o. Male) Treating RN: Baruch Gouty, RN, BSN, [place] Primary Care Physician: [name] Other Clinician: Referring Physician: [name] Treating Physician/Extender: Frann Rider in Treatment: 7 Subjective Chief Complaint Information obtained from Patient Patients presents for treatment of an open diabetic ulcer  and a recent laceration both lower extremities which happened on 06/05/2016 History of Present Illness (HPI) The following HPI elements were documented for the patient's wound: Location: bilateral lower extremity lacerations with staples intact on the right lower extremity Quality: Patient reports experiencing a dull pain to affected area(s). Severity: Patient states wound (s) are getting better. Duration: Patient has had the wound for < 2 weeks prior to presenting for treatment Timing: Pain in wound is Intermittent (comes and goes Context: The wound occurred when the patient had lacerations with a metal sheet which had to be repaired in the ER Modifying Factors: Other treatment(s) tried include:as noted he has staples on the right lower extremity and has been on 2 courses of antibiotics Associated Signs and Symptoms: Patient reports having increase swelling. 60 year old gentleman who is a patient of Dr. Nash Shearer and was seen by him recently on November 1 for a history of having scraped both legs with a piece of metal and was  seen in the ER and the right leg was closed with staples. He was placed on Keflex and. Dr. Loanne Drilling treated his diabetes appropriately and also prescribed doxycycline to be taken. 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 he is status post axillary hidradenitis excision, cataract extraction,laser ablation of condyloma, hemorrhoid surgery, incision and drainage of abscess, transurethral resection of bladder tumor. he smokes a pack of cigarettes every day. the patient has been seen by Dr. Fletcher Anon in the past and he has had a workup done which was remote in 2015 which showed the right side showed a ABI of 0.44 and the left was normal. he also had a duplex which  showed long occlusion of the right SFA from mid to distal segment with reconstitution in the popliteal artery and had significant left SFA stenosis. No intervention was done and he was put on Cilostazol. Not find any electronic records of recent tests done since August 2015. 06/23/2016 -- the patient continues to smoke half a pack of cigarettes a day and is working on giving up completely. 07/07/2016 -- he is using medihoney instead of Santyl as it was not affordable. [name], [name] ([id number]) 07-31-2016- Mr. Mezzanotte returns for evaluation of his right lower extremity ulcer. He has been using Medi honey. He continues to smoke, but is down to approximately 7 cigarettes a day. He has plans to have complete cessation by the new year as he has a scheduled surgical procedure to his face in January. He voices no complaints to his right lower extremity wound. He does present today with an abscess to his left axillary region. He has purchased compression stockings (15-20 mmHg), attempted to wear them on his left lower extremity and was unable to tolerate. Objective Constitutional Pulse regular. Respirations normal and unlabored. Afebrile. Vitals Time Taken: 3:04 PM, Height: 68 in, Weight: 202 lbs, BMI: 30.7, Temperature: 97.8 F, Pulse: 79 bpm, Respiratory Rate: 16 breaths/min, Blood Pressure: 147/79 mmHg. Eyes Nonicteric. Reactive to light. Ears, Nose, Mouth, and Throat Lips, teeth, and gums WNL.Marland Kitchen Moist mucosa without lesions. Neck supple and nontender. No palpable supraclavicular or cervical adenopathy. Normal sized without goiter. Respiratory WNL. No retractions.. Breath sounds WNL, No rubs, rales, rhonchi, or wheeze.. Cardiovascular Heart rhythm and rate regular, no murmur or gallop.. Pedal Pulses WNL. No clubbing, cyanosis or edema. Chest Breasts symmetical and no nipple discharge.. Breast tissue WNL, no masses, lumps, or tenderness.. Lymphatic No adneopathy. No adenopathy. No  adenopathy. Musculoskeletal Adexa without tenderness or enlargement.. Digits and nails w/o clubbing, cyanosis, infection, petechiae, ischemia, or inflammatory conditions.Marland Kitchen Psychiatric Judgement and insight Intact.. No evidence of depression, anxiety, or agitation.[name] ([id number]) General Notes: the ulcerated area on the right lower extremity is looking much better and after sharp debridement of the skin and subcutis tissue with a #3 curet minimal bleeding controlled with pressure. The left axillary abscess which was drained last week is completely closed and healed. Integumentary (Hair, Skin) No suspicious lesions. No crepitus or fluctuance. No peri-wound warmth or erythema. No masses.. Wound #1 status is Open. Original cause of wound was Trauma. The wound is located on the Right Lower Leg. The wound measures 2.9cm length x 0.3cm width x 0.2cm depth; 0.683cm^2 area and 0.137cm^3 volume. The wound is limited to skin breakdown. There is no tunneling or undermining noted. There is a medium amount of sanguinous  drainage noted. The wound margin is flat and intact. There is medium (34- 66%) pink granulation within the wound bed. There is a medium (34-66%) amount of necrotic tissue within the wound bed including Eschar and Adherent Slough. The periwound skin appearance exhibited: Induration, Scarring, Dry/Scaly. The periwound skin appearance did not exhibit: Callus, Crepitus, Excoriation, Fluctuance, Friable, Localized Edema, Rash, Maceration, Moist, Atrophie Blanche, Cyanosis, Ecchymosis, Hemosiderin Staining, Mottled, Pallor, Rubor, Erythema. Periwound temperature was noted as No Abnormality. The periwound has tenderness on palpation. Wound #3 status is Healed - Epithelialized. Original cause of wound was Pimple. The wound is located on the Left Axilla. The wound measures 0cm length x 0cm width x 0cm depth; 0cm^2 area and 0cm^3 volume. The wound is limited to skin breakdown. There is  no tunneling or undermining noted. There is a none present amount of drainage noted. The wound margin is distinct with the outline attached to the wound base. There is medium (34-66%) pink granulation within the wound bed. There is no necrotic tissue within the wound bed. The periwound skin appearance exhibited: Dry/Scaly. The periwound skin appearance did not exhibit: Callus, Crepitus, Excoriation, Fluctuance, Friable, Induration, Localized Edema, Rash, Scarring, Maceration, Moist, Atrophie Blanche, Cyanosis, Ecchymosis, Hemosiderin Staining, Mottled, Pallor, Rubor, Erythema. Periwound temperature was noted as No Abnormality. Assessment Active Problems ICD-10 E11.621 - Type 2 diabetes mellitus with foot ulcer S81.811A - Laceration without foreign body, right lower leg, initial encounter L02.412 - Cutaneous abscess of left axilla F17.218 - Nicotine dependence, cigarettes, with other nicotine-induced disorders Procedures Wound #1 Wound #1 is a Diabetic Wound/Ulcer of the Lower Extremity located on the Right Lower Leg . There was a [name], [name]. ([id number]) Skin/Subcutaneous Tissue Debridement [phone]) debridement with total area of 0.87 sq cm performed by Christin Fudge, MD. with the following instrument(s): Forceps to remove Viable and Non-Viable tissue/material including Fibrin/Slough, Eschar, Other, Skin, and Subcutaneous after achieving pain control using Lidocaine 4% Topical Solution. A time out was conducted at 15:15, prior to the start of the procedure. A Minimum amount of bleeding was controlled with Pressure. The procedure was tolerated well with a pain level of 0 throughout and a pain level of 0 following the procedure. Post Debridement Measurements: 2.9cm length x 0.3cm width x 0.2cm depth; 0.137cm^3 volume. Character of Wound/Ulcer Post Debridement is improved. Severity of Tissue Post Debridement is: Fat layer exposed. Post procedure Diagnosis Wound #1: Same as  Pre-Procedure Plan Wound Cleansing: Wound #1 Right Lower Leg: Clean wound with Normal Saline. May shower with protection. Anesthetic: Wound #1 Right Lower Leg: Topical Lidocaine 4% cream applied to wound bed prior to debridement - in office only Primary Wound Dressing: Wound #1 Right Lower Leg: Prisma Ag Secondary Dressing: Wound #1 Right Lower Leg: Dry Gauze Boardered Foam Dressing - Covellette bandaid Dressing Change Frequency: Wound #1 Right Lower Leg: Change dressing every other day. Follow-up Appointments: Wound #1 Right Lower Leg: Return Appointment in 1 week. Edema Control: Wound #1 Right Lower Leg: Patient to wear own compression stockings Elevate legs to the level of the heart and pump ankles as often as possible Additional Orders / Instructions: Wound #1 Right Lower Leg: Stop Smoking Activity as tolerated [name], [name]. ([id number]) The axillary wound is healed and I was asked him to stop the doxycycline which was prescribed to him last week. We'll continue to use some Prisma Ag this week and I have asked him not to use any adherent dressing to the surrounding skin. I have also asked him to get  hold of 20-30 mm compression stockings and wear this daily from morning till night. Electronic Signature(s) Signed: 08/07/2016 4:31:10 PM By: Christin Fudge MD, FACS Previous Signature: 08/07/2016 4:30:39 PM Version By: Christin Fudge MD, FACS Entered By: Christin Fudge on 08/07/2016 16:31:10 [name] ([id number]) -------------------------------------------------------------------------------- SuperBill Details Patient Name: [name] Date of Service: 08/07/2016 Medical Record Number: [id number] Patient Account Number: [id number] Date of Birth/Sex: Aug 17, 1955 (60 y.o. Male) Treating RN: Baruch Gouty, RN, BSN, Velva Harman Primary Care Physician: [name] Other Clinician: Referring Physician: [name] Treating Physician/Extender: Frann Rider in Treatment:  7 Diagnosis Coding ICD-10 Codes Code Description E11.621 Type 2 diabetes mellitus with foot ulcer S81.811A Laceration without foreign body, right lower leg, initial encounter L02.412 Cutaneous abscess of left axilla F17.218 Nicotine dependence, cigarettes, with other nicotine-induced disorders Facility Procedures CPT4 Code Description: [id number] - DEB SUBQ TISSUE 20 SQ CM/< ICD-10 Description Diagnosis E11.621 Type 2 diabetes mellitus with foot ulcer S81.811A Laceration without foreign body, right lower leg, init Modifier: ial encounte Quantity: 1 r Physician Procedures CPT4 Code Description: [id number] - WC PHYS SUBQ TISS 20 SQ CM ICD-10 Description Diagnosis E11.621 Type 2 diabetes mellitus with foot ulcer S81.811A Laceration without foreign body, right lower leg, init Modifier: ial encounte Quantity: 1 r Electronic Signature(s) Signed: 08/07/2016 4:31:25 PM By: Christin Fudge MD, FACS Entered By: Christin Fudge on 08/07/2016 16:31:25

## 2016-08-18 ENCOUNTER — Encounter: Payer: Medicare Other | Attending: Surgery | Admitting: Surgery

## 2016-08-18 DIAGNOSIS — L02412 Cutaneous abscess of left axilla: Secondary | ICD-10-CM | POA: Diagnosis not present

## 2016-08-18 DIAGNOSIS — F17218 Nicotine dependence, cigarettes, with other nicotine-induced disorders: Secondary | ICD-10-CM | POA: Insufficient documentation

## 2016-08-18 DIAGNOSIS — Z85828 Personal history of other malignant neoplasm of skin: Secondary | ICD-10-CM | POA: Insufficient documentation

## 2016-08-18 DIAGNOSIS — E11621 Type 2 diabetes mellitus with foot ulcer: Secondary | ICD-10-CM | POA: Diagnosis not present

## 2016-08-18 DIAGNOSIS — I10 Essential (primary) hypertension: Secondary | ICD-10-CM | POA: Insufficient documentation

## 2016-08-18 DIAGNOSIS — E11622 Type 2 diabetes mellitus with other skin ulcer: Secondary | ICD-10-CM | POA: Diagnosis not present

## 2016-08-18 DIAGNOSIS — X58XXXA Exposure to other specified factors, initial encounter: Secondary | ICD-10-CM | POA: Diagnosis not present

## 2016-08-18 DIAGNOSIS — Z8551 Personal history of malignant neoplasm of bladder: Secondary | ICD-10-CM | POA: Diagnosis not present

## 2016-08-18 DIAGNOSIS — E1140 Type 2 diabetes mellitus with diabetic neuropathy, unspecified: Secondary | ICD-10-CM | POA: Diagnosis not present

## 2016-08-18 DIAGNOSIS — S81811A Laceration without foreign body, right lower leg, initial encounter: Secondary | ICD-10-CM | POA: Insufficient documentation

## 2016-08-18 DIAGNOSIS — L97819 Non-pressure chronic ulcer of other part of right lower leg with unspecified severity: Secondary | ICD-10-CM | POA: Diagnosis not present

## 2016-08-18 DIAGNOSIS — Z794 Long term (current) use of insulin: Secondary | ICD-10-CM | POA: Insufficient documentation

## 2016-08-19 NOTE — Progress Notes (Signed)
[name] ([id number]) Visit Report for 08/18/2016 Chief Complaint Document Details Patient Name: [name], [name] Date of Service: 08/18/2016 10:45 AM Medical Record Number: [id number] Patient Account Number: [id number] Date of Birth/Sex: 09/05/1955 (60 y.o. Male) Treating RN: Cornell Barman Primary Care Physician: [name] Other Clinician: Referring Physician: [name] Treating Physician/Extender: Frann Rider in Treatment: 9 Information Obtained from: Patient Chief Complaint Patients presents for treatment of an open diabetic ulcer and a recent laceration both lower extremities which happened on 06/05/2016 Electronic Signature(s) Signed: 08/18/2016 11:20:46 AM By: Christin Fudge MD, FACS Entered By: Christin Fudge on 08/18/2016 11:20:46 [name] ([id number]) -------------------------------------------------------------------------------- HPI Details Patient Name: [name] Date of Service: 08/18/2016 10:45 AM Medical Record Number: [id number] Patient Account Number: [id number] Date of Birth/Sex: March 22, 1956 (61 y.o. Male) Treating RN: Cornell Barman Primary Care Physician: [name] Other Clinician: Referring Physician: [name] Treating Physician/Extender: Frann Rider in Treatment: 9 History of Present Illness Location: bilateral lower extremity lacerations with staples intact on the right lower extremity Quality: Patient reports experiencing a dull pain to affected area(s). Severity: Patient states wound (s) are getting better. Duration: Patient has had the wound for < 2 weeks prior to presenting for treatment Timing: Pain in wound is Intermittent (comes and goes Context: The wound occurred when the patient had lacerations with a metal sheet which had to be repaired in the ER Modifying Factors: Other treatment(s) tried include:as noted he has staples on the right lower extremity and has been on 2 courses of antibiotics Associated Signs and  Symptoms: Patient reports having increase swelling. HPI Description: 61 year old gentleman who is a patient of Dr. Nash Shearer and was seen by him recently on November 1 for a history of having scraped both legs with a piece of metal and was seen in the ER and the right leg was closed with staples. He was placed on Keflex and. Dr. Loanne Drilling treated his diabetes appropriately and also prescribed doxycycline to be taken. 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 he is status post axillary hidradenitis excision, cataract extraction,laser ablation of condyloma, hemorrhoid surgery, incision and drainage of abscess, transurethral resection of bladder tumor. he smokes a pack of cigarettes every day. the patient has been seen by Dr. Fletcher Anon in the past and he has had a workup done which was remote in 2015 which showed the right side showed a ABI of 0.44 and the left was normal. 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 No intervention was done and he was put on Cilostazol. Not find any electronic records of recent tests done since August 2015. 06/23/2016 -- the patient continues to smoke half a pack of cigarettes a day and is working on giving up completely. 07/07/2016 -- he is using medihoney instead of Santyl as it was not affordable. 07-31-2016- Mr. Damm returns for evaluation of his right lower extremity ulcer. He has been using Medi honey. He continues to smoke, but is down to approximately 7 cigarettes a day. He has plans to have complete cessation by the new year as he has a scheduled surgical procedure to his face in January. He voices no complaints to his  right lower extremity wound. He does present today with  an abscess to his left axillary region. He has purchased compression stockings (15-20 mmHg), attempted to wear them on his left lower extremity and was unable to tolerate. Electronic Signature(s) HIMANSHU, CHRESTMAN ([id number]) Signed: 08/18/2016 11:20:53 AM By: Christin Fudge MD, FACS Entered By: Christin Fudge on 08/18/2016 11:20:53 MEKELL, DITORO ([id number]) -------------------------------------------------------------------------------- Physical Exam Details Patient Name: [name] Date of Service: 08/18/2016 10:45 AM Medical Record Number: [id number] Patient Account Number: [id number] Date of Birth/Sex: [birth year] (61 y.o. Male) Treating RN: Cornell Barman Primary Care Physician: [name] Other Clinician: Referring Physician: [name] Treating Physician/Extender: Frann Rider in Treatment: 9 Constitutional . Pulse regular. Respirations normal and unlabored. Afebrile. . Eyes Nonicteric. Reactive to light. Ears, Nose, Mouth, and Throat Lips, teeth, and gums WNL.Marland Kitchen Moist mucosa without lesions. Neck supple and nontender. No palpable supraclavicular or cervical adenopathy. Normal sized without goiter. Respiratory WNL. No retractions.. Cardiovascular Pedal Pulses WNL. No clubbing, cyanosis or edema. Lymphatic No adneopathy. No adenopathy. No adenopathy. Musculoskeletal Adexa without tenderness or enlargement.. Digits and nails w/o clubbing, cyanosis, infection, petechiae, ischemia, or inflammatory conditions.. Integumentary (Hair, Skin) No suspicious lesions. No crepitus or fluctuance. No peri-wound warmth or erythema. No masses.Marland Kitchen Psychiatric Judgement and insight Intact.. No evidence of depression, anxiety, or agitation.. Notes there is a small eschar on the right lower extremity and when it was gently removed the wound is completely healed and there is no evidence of any open wound. Electronic  Signature(s) Signed: 08/18/2016 11:21:24 AM By: Christin Fudge MD, FACS Entered By: Christin Fudge on 08/18/2016 11:21:24 [name] ([id number]) -------------------------------------------------------------------------------- Physician Orders Details Patient Name: [name] Date of Service: 08/18/2016 10:45 AM Medical Record Number: [id number] Patient Account Number: [id number] Date of Birth/Sex: [birth year] (61 y.o. Male) Treating RN: Cornell Barman Primary Care Physician: [name] Other Clinician: Referring Physician: [name] Treating Physician/Extender: Frann Rider in Treatment: 9 Verbal / Phone Orders: No Diagnosis Coding Discharge From [facility] Services o Discharge from [place] Complete Electronic Signature(s) Signed: 08/18/2016 2:05:36 PM By: Christin Fudge MD, FACS Signed: 08/18/2016 4:50:09 PM By: Gretta Cool RN, BSN, Kim RN, BSN Entered By: Gretta Cool, RN, BSN, Kim on 08/18/2016 11:12:45 [name] ([id number]) -------------------------------------------------------------------------------- Problem List Details Patient Name: [name] Date of Service: 08/18/2016 10:45 AM Medical Record Number: [id number] Patient Account Number: [id number] Date of Birth/Sex: 1956-08-02 (61 y.o. Male) Treating RN: Cornell Barman Primary Care Physician: [name] Other Clinician: Referring Physician: [name] Treating Physician/Extender: Frann Rider in Treatment: 9 Active Problems ICD-10 Encounter Code Description Active Date Diagnosis E11.621 Type 2 diabetes mellitus with foot ulcer 06/16/2016 Yes S81.811A Laceration without foreign body, right lower leg, initial 06/16/2016 Yes encounter L02.412 Cutaneous abscess of left axilla 07/31/2016 Yes F17.218 Nicotine dependence, cigarettes, with other nicotine- 06/16/2016 Yes induced disorders Inactive Problems Resolved Problems ICD-10 Code Description Active Date Resolved Date S81.812A  Laceration without foreign body, left lower leg, initial 06/16/2016 06/16/2016 encounter Electronic Signature(s) Signed: 08/18/2016 11:20:25 AM By: Christin Fudge MD, FACS Entered By: Christin Fudge on 08/18/2016 11:20:25 [name] ([id number]) -------------------------------------------------------------------------------- Progress Note Details Patient Name: [name] Date of Service: 08/18/2016 10:45 AM Medical Record Number: [id number] Patient Account Number: [id number] Date of Birth/Sex: [birth year] (61 y.o. Male) Treating RN: Cornell Barman Primary Care Physician: [name] Other Clinician: Referring Physician: [name] Treating Physician/Extender: Frann Rider in Treatment: 9 Subjective Chief Complaint Information obtained from Patient Patients presents for treatment of an open diabetic ulcer and a recent  laceration both lower extremities which happened on 06/05/2016 History of Present Illness (HPI) The following HPI elements were documented for the patient's wound: Location: bilateral lower extremity lacerations with staples intact on the right lower extremity Quality: Patient reports experiencing a dull pain to affected area(s). Severity: Patient states wound (s) are getting better. Duration: Patient has had the wound for < 2 weeks prior to presenting for treatment Timing: Pain in wound is Intermittent (comes and goes Context: The wound occurred when the patient had lacerations with a metal sheet which had to be repaired in the ER Modifying Factors: Other treatment(s) tried include:as noted he has staples on the right lower extremity and has been on 2 courses of antibiotics Associated Signs and Symptoms: Patient reports having increase swelling. 61 year old gentleman who is a patient of Dr. Nash Shearer and was seen by him recently on November 1 for a history of having scraped both legs with a piece of metal and was seen in the ER and the right leg was closed  with staples. He was placed on Keflex and. Dr. Loanne Drilling treated his diabetes appropriately and also prescribed doxycycline to be taken. 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 he is status post axillary hidradenitis excision, cataract extraction,laser ablation of condyloma, hemorrhoid surgery, incision and drainage of abscess, transurethral resection of bladder tumor. he smokes a pack of cigarettes every day. the patient has been seen by Dr. Fletcher Anon in the past and he has had a workup done which was remote in 2015 which showed the right side showed a ABI of 0.44 and the left was normal. 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 No intervention was done and he was put on Cilostazol. Not find any electronic records of recent tests done since August 2015. 06/23/2016 -- the patient continues to smoke half a pack of cigarettes a day and is working on giving up completely. 07/07/2016 -- he is using medihoney instead of Santyl as it was not affordable. JONERIK, CULL ([id number]) 07-31-2016- Mr. Wojnarowski returns for evaluation of his right lower extremity ulcer. He has been using Medi honey. He continues to smoke, but is down to approximately 7 cigarettes a day. He has plans to have complete cessation by the new year as he has a scheduled surgical procedure to his face in January. He voices no complaints to his right lower extremity wound. He does present today with an abscess to his left axillary region. He has purchased compression stockings (15-20 mmHg), attempted to wear them on his left lower extremity and was unable to tolerate. Objective Constitutional Pulse  regular. Respirations normal and unlabored. Afebrile. Vitals Time Taken: 10:53 AM, Height: 68 in, Weight: 202 lbs, BMI: 30.7, Temperature: 97.6 F, Pulse: 85 bpm, Respiratory Rate: 16 breaths/min, Blood Pressure: 146/78 mmHg. Eyes Nonicteric. Reactive to light. Ears, Nose, Mouth, and Throat Lips, teeth, and gums WNL.Marland Kitchen Moist mucosa without lesions. Neck supple and nontender. No palpable supraclavicular or cervical adenopathy. Normal sized without goiter. Respiratory WNL. No retractions.. Cardiovascular Pedal Pulses WNL. No clubbing, cyanosis or edema. Lymphatic No adneopathy. No adenopathy. No adenopathy. Musculoskeletal Adexa without tenderness or enlargement.. Digits and nails w/o clubbing, cyanosis, infection, petechiae, ischemia, or inflammatory conditions.Marland Kitchen Psychiatric Judgement and insight Intact.Marland Kitchen  No evidence of depression, anxiety, or agitation.. General Notes: there is a small eschar on the right lower extremity and when it was gently removed the wound is completely healed and there is no evidence of any open wound. DAYRON, DIETERT ([id number]) Integumentary (Hair, Skin) No suspicious lesions. No crepitus or fluctuance. No peri-wound warmth or erythema. No masses.. Wound #1 status is Healed - Epithelialized. Original cause of wound was Trauma. The wound is located on the Right Lower Leg. The wound measures 0cm length x 0cm width x 0cm depth; 0cm^2 area and 0cm^3 volume. The wound is limited to skin breakdown. There is no tunneling or undermining noted. There is a medium amount of sanguinous drainage noted. The wound margin is flat and intact. There is no granulation within the wound bed. There is a large (67-100%) amount of necrotic tissue within the wound bed. The periwound skin appearance exhibited: Scarring, Dry/Scaly. The periwound skin appearance did not exhibit: Callus, Crepitus, Excoriation, Fluctuance, Friable, Induration, Localized Edema, Rash, Maceration,  Moist, Atrophie Blanche, Cyanosis, Ecchymosis, Hemosiderin Staining, Mottled, Pallor, Rubor, Erythema. Periwound temperature was noted as No Abnormality. The periwound has tenderness on palpation. Assessment Active Problems ICD-10 E11.621 - Type 2 diabetes mellitus with foot ulcer S81.811A - Laceration without foreign body, right lower leg, initial encounter L02.412 - Cutaneous abscess of left axilla F17.218 - Nicotine dependence, cigarettes, with other nicotine-induced disorders Plan Discharge From [facility] Services: Discharge from [place] - Treatment Complete The wound is healed and I have asked him to protect this with a bordered foam and protect the supple scar. He is discharge from the wound care services and will be seen back if needed. ANIRUDH, GIVINS ([id number]) Electronic Signature(s) Signed: 08/18/2016 11:22:32 AM By: Christin Fudge MD, FACS Entered By: Christin Fudge on 08/18/2016 11:22:32 CHAMP, GIALANELLA ([id number]) -------------------------------------------------------------------------------- SuperBill Details Patient Name: [name] Date of Service: 08/18/2016 Medical Record Number: [id number] Patient Account Number: [id number] Date of Birth/Sex: 11-Dec-1955 (60 y.o. Male) Treating RN: Cornell Barman Primary Care Physician: [name] Other Clinician: Referring Physician: [name] Treating Physician/Extender: Frann Rider in Treatment: 9 Diagnosis Coding ICD-10 Codes Code Description E11.621 Type 2 diabetes mellitus with foot ulcer S81.811A Laceration without foreign body, right lower leg, initial encounter L02.412 Cutaneous abscess of left axilla F17.218 Nicotine dependence, cigarettes, with other nicotine-induced disorders Facility Procedures CPT4 Code: [id number] Description: [id number] - WOUND CARE VISIT-LEV 2 EST PT Modifier: Quantity: 1 Physician Procedures CPT4 Code Description: [id number] - WC PHYS LEVEL 2 - EST PT ICD-10 Description  Diagnosis E11.621 Type 2 diabetes mellitus with foot ulcer S81.811A Laceration without foreign body, right lower leg, init Modifier: ial encounte Quantity: 1 r Electronic Signature(s) Signed: 08/18/2016 11:22:46 AM By: Christin Fudge MD, FACS Entered By: Christin Fudge on 08/18/2016 11:22:45

## 2016-08-19 NOTE — Progress Notes (Signed)
NARCISSE, BOTZ ([id number]) Visit Report for 08/18/2016 Arrival Information Details Patient Name: [name], [name] Date of Service: 08/18/2016 10:45 AM Medical Record Number: [id number] Patient Account Number: [id number] Date of Birth/Sex: [birth year] (61 y.o. Male) Treating RN: Cornell Barman Primary Care Physician: [name] Other Clinician: Referring Physician: [name] Treating Physician/Extender: Frann Rider in Treatment: 9 Visit Information History Since Last Visit Added or deleted any medications: No Patient Arrived: Ambulatory Any new allergies or adverse No Arrival Time: 10:52 reactions: Accompanied By: wife Had a fall or experienced change in No Transfer Assistance: None activities of daily living that may Patient Identification Verified: Yes affect Secondary Verification Process Yes risk of falls: Completed: Signs or symptoms of abuse/neglect No Patient Has Alerts: Yes since last visito Patient Alerts: Patient on Blood Has Dressing in Place as Yes Thinner Prescribed: DM Type II Pain Present Now: Unable to Respond Electronic Signature(s) Signed: 08/18/2016 4:50:09 PM By: Gretta Cool, RN, BSN, Kim RN, BSN Entered By: Gretta Cool, RN, BSN, Kim on 08/18/2016 10:53:32 Khatoon, Mora Appl ([id number]) -------------------------------------------------------------------------------- Clinic Level of Care Assessment Details Patient Name: [name] Date of Service: 08/18/2016 10:45 AM Medical Record Number: [id number] Patient Account Number: [id number] Date of Birth/Sex: [birth year] (61 y.o. Male) Treating RN: Cornell Barman Primary Care Physician: [name] Other Clinician: Referring Physician: [name] Treating Physician/Extender: Frann Rider in Treatment: 9 Clinic Level of Care Assessment Items TOOL 4 Quantity Score []  - Use when only an EandM is performed on FOLLOW-UP visit 0 ASSESSMENTS - Nursing Assessment / Reassessment []  - Reassessment of  Co-morbidities (includes updates in patient status) 0 X - Reassessment of Adherence to Treatment Plan 1 5 ASSESSMENTS - Wound and Skin Assessment / Reassessment X - Simple Wound Assessment / Reassessment - one wound 1 5 []  - Complex Wound Assessment / Reassessment - multiple wounds 0 []  - Dermatologic / Skin Assessment (not related to wound area) 0 ASSESSMENTS - Focused Assessment []  - Circumferential Edema Measurements - multi extremities 0 []  - Nutritional Assessment / Counseling / Intervention 0 []  - Lower Extremity Assessment (monofilament, tuning fork, pulses) 0 []  - Peripheral Arterial Disease Assessment (using hand held doppler) 0 ASSESSMENTS - Ostomy and/or Continence Assessment and Care []  - Incontinence Assessment and Management 0 []  - Ostomy Care Assessment and Management (repouching, etc.) 0 PROCESS - Coordination of Care X - Simple Patient / Family Education for ongoing care 1 15 []  - Complex (extensive) Patient / Family Education for ongoing care 0 []  - Staff obtains Programmer, systems, Records, Test Results / Process Orders 0 []  - Staff telephones HHA, Nursing Homes / Clarify orders / etc 0 []  - Routine Transfer to another Facility (non-emergent condition) 0 GABEL, RAMIERZ ([id number]) []  - Routine Hospital Admission (non-emergent condition) 0 []  - New Admissions / Biomedical engineer / Ordering NPWT, Apligraf, etc. 0 []  - Emergency Hospital Admission (emergent condition) 0 X - Simple Discharge Coordination 1 10 []  - Complex (extensive) Discharge Coordination 0 PROCESS - Special Needs []  - Pediatric / Minor Patient Management 0 []  - Isolation Patient Management 0 []  - Hearing / Language / Visual special needs 0 []  - Assessment of Community assistance (transportation, D/C planning, etc.) 0 []  - Additional assistance / Altered mentation 0 []  - Support Surface(s) Assessment (bed, cushion, seat, etc.) 0 INTERVENTIONS - Wound Cleansing / Measurement []  - Simple Wound Cleansing  - one wound 0 []  - Complex Wound Cleansing - multiple wounds 0 X - Wound Imaging (photographs - any number of wounds)  1 5 []  - Wound Tracing (instead of photographs) 0 []  - Simple Wound Measurement - one wound 0 []  - Complex Wound Measurement - multiple wounds 0 INTERVENTIONS - Wound Dressings X - Small Wound Dressing one or multiple wounds 1 10 []  - Medium Wound Dressing one or multiple wounds 0 []  - Large Wound Dressing one or multiple wounds 0 []  - Application of Medications - topical 0 []  - Application of Medications - injection 0 INTERVENTIONS - Miscellaneous []  - External ear exam 0 JUJUAN, GALLIA. ([id number]) []  - Specimen Collection (cultures, biopsies, blood, body fluids, etc.) 0 []  - Specimen(s) / Culture(s) sent or taken to Lab for analysis 0 []  - Patient Transfer (multiple staff / Harrel Lemon Lift / Similar devices) 0 []  - Simple Staple / Suture removal (25 or less) 0 []  - Complex Staple / Suture removal (26 or more) 0 []  - Hypo / Hyperglycemic Management (close monitor of Blood Glucose) 0 []  - Ankle / Brachial Index (ABI) - do not check if billed separately 0 X - Vital Signs 1 5 Has the patient been seen at the hospital within the last three years: Yes Total Score: 55 Level Of Care: New/Established - Level 2 Electronic Signature(s) Signed: 08/18/2016 4:50:09 PM By: Gretta Cool, RN, BSN, Kim RN, BSN Entered By: Gretta Cool, RN, BSN, Kim on 08/18/2016 11:13:34 Revoir, Mora Appl ([id number]) -------------------------------------------------------------------------------- Encounter Discharge Information Details Patient Name: [name] Date of Service: 08/18/2016 10:45 AM Medical Record Number: [id number] Patient Account Number: [id number] Date of Birth/Sex: 1956-05-25 (61 y.o. Male) Treating RN: Cornell Barman Primary Care Physician: [name] Other Clinician: Referring Physician: [name] Treating Physician/Extender: Frann Rider in Treatment: 9 Encounter Discharge  Information Items Discharge Pain Level: 0 Discharge Condition: Stable Ambulatory Status: Ambulatory Discharge Destination: Home Transportation: Private Auto Accompanied By: wife Schedule Follow-up Appointment: Yes Medication Reconciliation completed and provided to Patient/Care Yes Shuan Statzer: Provided on Clinical Summary of Care: 08/18/2016 Form Type Recipient Paper Patient TW Electronic Signature(s) Signed: 08/18/2016 11:16:08 AM By: Ruthine Dose Entered By: Ruthine Dose on 08/18/2016 11:16:08 [name] ([id number]) -------------------------------------------------------------------------------- Lower Extremity Assessment Details Patient Name: [name] Date of Service: 08/18/2016 10:45 AM Medical Record Number: [id number] Patient Account Number: [id number] Date of Birth/Sex: 1955-12-15 (61 y.o. Male) Treating RN: Cornell Barman Primary Care Physician: [name] Other Clinician: Referring Physician: [name] Treating Physician/Extender: Frann Rider in Treatment: 9 Vascular Assessment Claudication: Claudication Assessment [Right:None] Pulses: Dorsalis Pedis Palpable: [Right:Yes] Posterior Tibial Extremity colors, hair growth, and conditions: Extremity Color: [Right:Normal] Hair Growth on Extremity: [Right:Yes] Temperature of Extremity: [Right:Warm] Capillary Refill: [Right:< 3 seconds] Toe Nail Assessment Left: Right: Thick: No Discolored: No Deformed: No Improper Length and Hygiene: No Electronic Signature(s) Signed: 08/18/2016 4:50:09 PM By: Gretta Cool, RN, BSN, Kim RN, BSN Entered By: Gretta Cool, RN, BSN, Kim on 08/18/2016 11:03:33 [name] ([id number]) -------------------------------------------------------------------------------- Multi Wound Chart Details Patient Name: [name] Date of Service: 08/18/2016 10:45 AM Medical Record Number: [id number] Patient Account Number: [id number] Date of Birth/Sex: 1956-06-06 (61 y.o. 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 Secondary Etiology: Trauma, Other N/A N/A Comorbid History: Cataracts,  Deep Vein N/A N/A Thrombosis, Hypertension, Type II Diabetes, Neuropathy Date Acquired: 06/05/2016 N/A N/A Weeks of Treatment: 9 N/A N/A Wound Status: Healed - Epithelialized N/A N/A Measurements L x W x D 0x0x0 N/A N/A (cm) Area (cm) : 0 N/A N/A Volume (cm) : 0 N/A N/A % Reduction in Area: 100.00% N/A N/A % Reduction in Volume: 100.00% N/A N/A Classification: Grade 1 N/A N/A Exudate Amount: Medium N/A N/A Exudate Type: Sanguinous N/A N/A Exudate Color: red N/A N/A Wound Margin: Flat and Intact N/A N/A Granulation Amount: None Present (0%) N/A N/A LATRELLE, NOAH. ([id number]) Necrotic Amount: Large (67-100%) N/A N/A Exposed Structures: Fascia: No N/A N/A Fat: No Tendon: No Muscle: No Joint: No Bone: No Limited to Skin Breakdown Epithelialization: Large (67-100%) N/A N/A Periwound Skin Texture: Scarring: Yes N/A N/A Edema: No Excoriation: No Induration: No Callus: No Crepitus: No Fluctuance: No Friable: No Rash: No Periwound Skin Dry/Scaly: Yes N/A N/A Moisture: Maceration: No Moist: No Periwound Skin Color: Atrophie Blanche: No N/A N/A Cyanosis: No Ecchymosis: No Erythema: No Hemosiderin Staining: No Mottled: No Pallor: No Rubor: No Temperature: No Abnormality N/A N/A Tenderness on Yes N/A N/A Palpation: Wound Preparation: Ulcer Cleansing: N/A N/A Rinsed/Irrigated  with Saline Topical Anesthetic Applied: Other: lidocaine 4% Treatment Notes Electronic Signature(s) Signed: 08/18/2016 11:20:32 AM By: Christin Fudge MD, FACS Entered By: Christin Fudge on 08/18/2016 11:20:32 [name] ([id number]) -------------------------------------------------------------------------------- [place] Details Patient Name: [name] Date of Service: 08/18/2016 10:45 AM Medical Record Number: [id number] Patient Account Number: [id number] Date of Birth/Sex: 1955/11/01 (61 y.o. Male) Treating RN: Cornell Barman Primary Care Physician: [name] Other Clinician: Referring Physician: [name] Treating Physician/Extender: Frann Rider in Treatment: 9 Active Inactive Electronic Signature(s) Signed: 08/18/2016 4:50:09 PM By: Gretta Cool, RN, BSN, Kim RN, BSN Entered By: Gretta Cool, RN, BSN, Kim on 08/18/2016 11:51:18 [name] ([id number]) -------------------------------------------------------------------------------- Pain Assessment Details Patient Name: [name] Date of Service: 08/18/2016 10:45 AM Medical Record Number: [id number] Patient Account Number: [id number] Date of Birth/Sex: Aug 22, 1955 (61 y.o. Male) Treating RN: Cornell Barman Primary Care Physician: [name] Other Clinician: Referring Physician: [name] Treating Physician/Extender: Frann Rider in Treatment: 9 Active Problems Location of Pain Severity and Description of Pain Patient Has Paino No Site Locations With Dressing Change: No Pain Management and Medication Current Pain Management: Goals for Pain Management Topical or injectable lidocaine is offered to patient for acute pain when surgical debridement is performed. If needed, Patient is instructed to use over the counter pain medication for the following 24-48 hours after debridement. Wound care MDs do not prescribed pain medications. Patient has chronic pain or uncontrolled pain. Patient  has been instructed to make an appointment with their Primary Care Physician for pain management. Electronic Signature(s) Signed: 08/18/2016 4:50:09 PM By: Gretta Cool, RN, BSN, Kim RN, BSN Entered By: Gretta Cool, RN, BSN, Kim on 08/18/2016 10:53:54 [name] ([id number]) -------------------------------------------------------------------------------- Patient/Caregiver Education Details Patient Name: [name] Date of Service: 08/18/2016 10:45 AM Medical Record Number: [id number] Patient Account Number: [id number] Date of Birth/Gender: [birth year] (61 y.o. Male) Treating RN: Cornell Barman Primary Care Physician: [name] Other Clinician: Referring Physician: [name] Treating Physician/Extender: Frann Rider in Treatment: 9 Education Assessment Education Provided To: Patient Education Topics Provided Wound/Skin Impairment: Methods: Demonstration Responses: State content correctly [employer]) Signed: 08/18/2016 4:50:09 PM By: Gretta Cool, RN, BSN, Kim RN, BSN Entered By: Gretta Cool, RN, BSN, Kim on 08/18/2016 11:14:55 Brazzel, Mora Appl ([id number]) -------------------------------------------------------------------------------- Wound Assessment Details  Patient Name: MEHAR, LANGLINAIS Date of Service: 08/18/2016 10:45 AM Medical Record Number: [id number] Patient Account Number: [id number] Date of Birth/Sex: Jun 24, 1956 (61 y.o. Male) Treating RN: Cornell Barman Primary Care Physician: [name] Other Clinician: Referring Physician: [name] Treating Physician/Extender: Frann Rider in Treatment: 9 Wound Status Wound Number: 1 Primary Diabetic Wound/Ulcer of the Lower Etiology: Extremity Wound Location: Right Lower Leg Secondary Trauma, Other Wounding Event: Trauma Etiology: Date Acquired: 06/05/2016 Wound Healed - Epithelialized Weeks Of Treatment: 9 Status: Clustered Wound: No Comorbid Cataracts, Deep Vein Thrombosis, History: Hypertension, Type II  Diabetes, Neuropathy Photos Wound Measurements Length: (cm) 0 % Reduction in Width: (cm) 0 % Reduction in Depth: (cm) 0 Epithelializat Area: (cm) 0 Tunneling: Volume: (cm) 0 Undermining: Area: 100% Volume: 100% ion: Large (67-100%) No No Wound Description Classification: Grade 1 Wound Margin: Flat and Intact Exudate Amount: Medium Exudate Type: Sanguinous Exudate Color: red Foul Odor After Cleansing: No Wound Bed Granulation Amount: None Present (0%) Exposed Structure Necrotic Amount: Large (67-100%) Fascia Exposed: No Fat Layer Exposed: No Tendon Exposed: No Muscle Exposed: No SATYAM, CIZEK ([id number]) Joint Exposed: No Bone Exposed: No Limited to Skin Breakdown Periwound Skin Texture Texture Color No Abnormalities Noted: No No Abnormalities Noted: No Callus: No Atrophie Blanche: No Crepitus: No Cyanosis: No Excoriation: No Ecchymosis: No Fluctuance: No Erythema: No Friable: No Hemosiderin Staining: No Induration: No Mottled: No Localized Edema: No Pallor: No Rash: No Rubor: No Scarring: Yes Temperature / Pain Moisture Temperature: No Abnormality No Abnormalities Noted: No Tenderness on Palpation: Yes Dry / Scaly: Yes Maceration: No Moist: No Wound Preparation Ulcer Cleansing: Rinsed/Irrigated with Saline Topical Anesthetic Applied: Other: lidocaine 4%, Electronic Signature(s) Signed: 08/18/2016 4:50:09 PM By: Gretta Cool, RN, BSN, Kim RN, BSN Entered By: Gretta Cool, RN, BSN, Kim on 08/18/2016 11:08:21 [name] ([id number]) -------------------------------------------------------------------------------- [place] Details Patient Name: [name] Date of Service: 08/18/2016 10:45 AM Medical Record Number: [id number] Patient Account Number: [id number] Date of Birth/Sex: 07-30-1956 (61 y.o. Male) Treating RN: Cornell Barman Primary Care Physician: [name] Other Clinician: Referring Physician: [name] Treating Physician/Extender:  Frann Rider in Treatment: 9 Vital Signs Time Taken: 10:53 Temperature (F): 97.6 Height (in): 68 Pulse (bpm): 85 Weight (lbs): 202 Respiratory Rate (breaths/min): 16 Body Mass Index (BMI): 30.7 Blood Pressure (mmHg): 146/78 Reference Range: 80 - 120 mg / dl Electronic Signature(s) Signed: 08/18/2016 4:50:09 PM By: Gretta Cool, RN, BSN, Kim RN, BSN Entered By: Gretta Cool, RN, BSN, Kim on 08/18/2016 10:56:08

## 2016-09-08 ENCOUNTER — Telehealth: Payer: Self-pay | Admitting: Endocrinology

## 2016-09-08 MED ORDER — HYDROCODONE-ACETAMINOPHEN 5-325 MG PO TABS: 1.0000 | ORAL_TABLET | Freq: Four times a day (QID) | ORAL | 0 refills | Status: DC | PRN

## 2016-09-08 NOTE — Telephone Encounter (Signed)
See message and please advise, Thanks!  

## 2016-09-08 NOTE — Telephone Encounter (Signed)
I contacted the patient and advised rx is ready for pick up. Rx placed up front.  

## 2016-09-08 NOTE — Telephone Encounter (Signed)
Need refill of medication  HYDROcodone-acetaminophen (NORCO/VICODIN) 5-325 MG tablet 100 tablet

## 2016-09-08 NOTE — Telephone Encounter (Signed)
I printed  

## 2016-09-29 ENCOUNTER — Other Ambulatory Visit: Payer: Self-pay | Admitting: Endocrinology

## 2016-10-07 DIAGNOSIS — C44119 Basal cell carcinoma of skin of left eyelid, including canthus: Secondary | ICD-10-CM | POA: Diagnosis not present

## 2016-10-07 DIAGNOSIS — C44319 Basal cell carcinoma of skin of other parts of face: Secondary | ICD-10-CM | POA: Diagnosis not present

## 2016-10-08 DIAGNOSIS — C44319 Basal cell carcinoma of skin of other parts of face: Secondary | ICD-10-CM | POA: Diagnosis not present

## 2016-10-08 DIAGNOSIS — C44119 Basal cell carcinoma of skin of left eyelid, including canthus: Secondary | ICD-10-CM | POA: Diagnosis not present

## 2016-10-08 DIAGNOSIS — Z8551 Personal history of malignant neoplasm of bladder: Secondary | ICD-10-CM | POA: Diagnosis not present

## 2016-10-08 DIAGNOSIS — E1151 Type 2 diabetes mellitus with diabetic peripheral angiopathy without gangrene: Secondary | ICD-10-CM | POA: Diagnosis not present

## 2016-10-08 DIAGNOSIS — E785 Hyperlipidemia, unspecified: Secondary | ICD-10-CM | POA: Diagnosis not present

## 2016-10-08 DIAGNOSIS — C44112 Basal cell carcinoma of skin of right eyelid, including canthus: Secondary | ICD-10-CM | POA: Diagnosis not present

## 2016-10-08 DIAGNOSIS — Z85828 Personal history of other malignant neoplasm of skin: Secondary | ICD-10-CM | POA: Diagnosis not present

## 2016-10-08 DIAGNOSIS — G8929 Other chronic pain: Secondary | ICD-10-CM | POA: Insufficient documentation

## 2016-10-08 DIAGNOSIS — I771 Stricture of artery: Secondary | ICD-10-CM | POA: Diagnosis not present

## 2016-10-08 DIAGNOSIS — Z7902 Long term (current) use of antithrombotics/antiplatelets: Secondary | ICD-10-CM | POA: Diagnosis not present

## 2016-10-08 DIAGNOSIS — I10 Essential (primary) hypertension: Secondary | ICD-10-CM | POA: Diagnosis not present

## 2016-10-08 DIAGNOSIS — Z79899 Other long term (current) drug therapy: Secondary | ICD-10-CM | POA: Diagnosis not present

## 2016-10-08 DIAGNOSIS — Z481 Encounter for planned postprocedural wound closure: Secondary | ICD-10-CM | POA: Diagnosis not present

## 2016-10-08 DIAGNOSIS — Z794 Long term (current) use of insulin: Secondary | ICD-10-CM | POA: Diagnosis not present

## 2016-10-09 ENCOUNTER — Ambulatory Visit: Payer: Medicare Other | Admitting: Endocrinology

## 2016-10-14 DIAGNOSIS — Z79899 Other long term (current) drug therapy: Secondary | ICD-10-CM | POA: Diagnosis not present

## 2016-10-14 DIAGNOSIS — I10 Essential (primary) hypertension: Secondary | ICD-10-CM | POA: Diagnosis not present

## 2016-10-14 DIAGNOSIS — E119 Type 2 diabetes mellitus without complications: Secondary | ICD-10-CM | POA: Diagnosis not present

## 2016-10-14 DIAGNOSIS — Z48810 Encounter for surgical aftercare following surgery on the sense organs: Secondary | ICD-10-CM | POA: Diagnosis not present

## 2016-10-14 DIAGNOSIS — Z794 Long term (current) use of insulin: Secondary | ICD-10-CM | POA: Diagnosis not present

## 2016-10-20 ENCOUNTER — Ambulatory Visit: Payer: Medicare Other | Admitting: Endocrinology

## 2016-10-21 ENCOUNTER — Telehealth: Payer: Self-pay | Admitting: Endocrinology

## 2016-10-21 MED ORDER — HYDROCODONE-ACETAMINOPHEN 5-325 MG PO TABS: 1.0000 | ORAL_TABLET | Freq: Four times a day (QID) | ORAL | 0 refills | Status: DC | PRN

## 2016-10-21 NOTE — Telephone Encounter (Signed)
Patient notified rx is ready for pick up. Rx placed upfront.

## 2016-10-21 NOTE — Telephone Encounter (Signed)
Refill  HYDROcodone-acetaminophen (NORCO/VICODIN) 5-325 MG tablet 100 tablet   Call when ready

## 2016-10-21 NOTE — Telephone Encounter (Signed)
I printed  

## 2016-11-06 ENCOUNTER — Encounter: Payer: Self-pay | Admitting: Endocrinology

## 2016-11-06 ENCOUNTER — Ambulatory Visit (INDEPENDENT_AMBULATORY_CARE_PROVIDER_SITE_OTHER): Payer: Medicare Other | Admitting: Endocrinology

## 2016-11-06 VITALS — BP 136/88 | HR 68 | Ht 68.0 in | Wt 220.0 lb

## 2016-11-06 DIAGNOSIS — E1151 Type 2 diabetes mellitus with diabetic peripheral angiopathy without gangrene: Secondary | ICD-10-CM

## 2016-11-06 DIAGNOSIS — Z794 Long term (current) use of insulin: Secondary | ICD-10-CM | POA: Diagnosis not present

## 2016-11-06 MED ORDER — GABAPENTIN 300 MG PO CAPS: 300.0000 mg | ORAL_CAPSULE | Freq: Two times a day (BID) | ORAL | 11 refills | Status: DC

## 2016-11-06 MED ORDER — INSULIN GLARGINE 100 UNIT/ML SOLOSTAR PEN: 85.0000 [IU] | PEN_INJECTOR | SUBCUTANEOUS | 99 refills | Status: DC

## 2016-11-06 NOTE — Patient Instructions (Addendum)
Please increase the lantus to 85 units each morning.  check your blood sugar twice a day.  vary the time of day when you check, between before the 3 meals, and at bedtime.  also check if you have symptoms of your blood sugar being too high or too low.  please keep a record of the readings and bring it to your next appointment here (or you can bring the meter itself).  You can write it on any piece of paper.  please call us sooner if your blood sugar goes below 70, or if you have a lot of readings over 200.  good diet and exercise significantly improve the control of your diabetes.  please let me know if you wish to be referred to a dietician.   I have sent a prescription to your pharmacy, to start the gabapentin.  Our goal is to lessen your need for the vicodin. Please come back for a follow-up appointment in 2 months.

## 2016-11-06 NOTE — Progress Notes (Signed)
Subjective:    Patient ID: [name], male    DOB: 1955-10-04, 61 y.o.   MRN: [id number]  HPI Pt returns for f/u of diabetes mellitus: DM type: Insulin-requiring type 2 Dx'ed: 4782 Complications: PAD and polyneuropathy.  Therapy: insulin since 2009 DKA: never Severe hypoglycemia: never Pancreatitis: never Other: he takes a QD insulin regimen, after poor results with multiple daily injections, and then also with BID insulin.  Interval history: he says he never misses the insulin.  Meter is downloaded today, and the printout is scanned into the record.  It varies from 140-440.  all are checked fasting.  Chronic left shoulder pain persists. He does not take gabapentin. He says a topical product caused a rash.   Past Medical History:  Diagnosis Date  . Allergic rhinitis   . Arthritis   . At risk for sleep apnea    STOP-BANG= 5   SENT TO PCP 03-14-2014  . Condyloma acuminatum of penis   . Diabetic neuropathy ([place])   . GERD (gastroesophageal reflux disease)   . History of bladder cancer    s/p  turbt  2013/   transitional cell carcinoma--   . History of condyloma acuminatum    PERINEAL AREA  W/ RECURRENCY  . History of gout   . Hyperlipidemia   . Hypertension   . Lower urinary tract symptoms (LUTS)   . Productive cough   . PVD (peripheral vascular disease) with claudication (HCC)    bilateral SFA disease-- right > left  and left tibial artery disease--  per duplex  . Smokers' cough ([place])   . Type 2 diabetes mellitus with insulin therapy ([place])    monitor by  dr ellsion  . Wears dentures    upper    Past Surgical History:  Procedure Laterality Date  . AXILLARY HIDRADENITIS EXCISION  1997  . CARDIOVASCULAR STRESS TEST  07-24-2014  dr Kathlyn Sacramento   Low risk lexiscan nuclear study with apical thinning and small inferolateral wall infarct at mid & basal level , no ischemia/  normal LVF and wall motion , ef 59%  . CATARACT EXTRACTION W/ INTRAOCULAR LENS IMPLANT Right     . CO2 LASER APPLICATION N/A 9/56/2130   Procedure: CO2 LASER APPLICATION,PENIS, GROIN, ANUS;  Surgeon: [name], MD;  Location: [place];  Service: General;  Laterality: N/A;  . CO2 LASER APPLICATION N/A 86/57/8469   Procedure: CO2 LASER APPLICATION;  Surgeon: Kathie Rhodes, MD;  Location: [facility];  Service: Urology;  Laterality: N/A;  . CONDYLOMA EXCISION/FULGURATION N/A 05/21/2015   Procedure: CONDYLOMA REMOVAL;  Surgeon: Kathie Rhodes, MD;  Location: [facility];  Service: Urology;  Laterality: N/A;  . HEMORRHOID SURGERY  10/24/2014   Procedure: HEMORRHOIDECTOMY;  Surgeon: Michael Boston, MD;  Location: [facility];  Service: General;;  . INCISION AND DRAINAGE ABSCESS Left 10/24/2014   Procedure: INCISION AND DRAINAGE ABSCESS;  Surgeon: Michael Boston, MD;  Location: [place];  Service: General;  Laterality: Left;  . INGUINAL HIDRADENITIS EXCISION  1998, 1999  . LASER ABLATION CONDOLAMATA N/A 03/20/2014   Procedure: EXAM UNDER ANESTHESIA, REMOVAL/ABLATION OF CONDYLOMATA PENIS,GROINS, ANUS, ANAL CANAL;  Surgeon: [name], MD;  Location: [place];  Service: General;  Laterality: N/A;  groin and anus  . LASER ABLATION CONDOLAMATA N/A 10/24/2014   Procedure: LASER ABLATION CONDOLAMATA;  Surgeon: Michael Boston, MD;  Location: [facility];  Service: General;  Laterality: N/A;  .  LASER ABLATION OF PENILE AND PERIANAL WARTS  07-29-2007  Dr. Johney Maine  . LEFT SHOULDER SURGERY  2003  . MASS EXCISION N/A 10/24/2014   Procedure: EXCISION OF PERINEAL MASS/SINUS;  Surgeon: Michael Boston, MD;  Location: [place];  Service: General;  Laterality: N/A;  . [place]  . TRANSURETHRAL RESECTION OF BLADDER TUMOR  05/21/2012   Procedure: TRANSURETHRAL RESECTION OF BLADDER TUMOR (TURBT);  Surgeon: Claybon Jabs, MD;  Location: [facility];  Service: Urology;  Laterality: N/A;       Social History   Social History  . Marital status: Married    Spouse name: N/A  . Number of children: N/A  . Years of education: N/A   Occupational History  . Disabled    Social History Main Topics  . Smoking status: Current Every Day Smoker    Packs/day: 1.00    Years: 41.00    Types: Cigars, Cigarettes  . Smokeless tobacco: Never Used  . Alcohol use No  . Drug use: No  . Sexual activity: Not on file   Other Topics Concern  . Not on file   Social History Narrative  . No narrative on file    Current Outpatient Prescriptions on File Prior to Visit  Medication Sig Dispense Refill  . atorvastatin (LIPITOR) 20 MG tablet TAKE ONE TABLET BY MOUTH ONCE DAILY 90 tablet 1  . HYDROcodone-acetaminophen (NORCO/VICODIN) 5-325 MG tablet Take 1 tablet by mouth every 6 (six) hours as needed for moderate pain. 100 tablet 0  . losartan-hydrochlorothiazide (HYZAAR) 100-12.5 MG tablet Take 1 tablet by mouth daily. 90 tablet 1  . cilostazol (PLETAL) 100 MG tablet Take 1 tablet (100 mg total) by mouth 2 (two) times daily. (Patient not taking: Reported on 11/06/2016) 60 tablet 11   No current facility-administered medications on file prior to visit.     No Known Allergies  Family History  Problem Relation Age of Onset  . Hypertension Mother   . Cancer Father     lung ca  . Diabetes Maternal Aunt     x 2  . Colon cancer Neg Hx   . Esophageal cancer Neg Hx   . Pancreatic cancer Neg Hx   . Prostate cancer Neg Hx   . Kidney disease Neg Hx   . Liver disease Neg Hx   . Lung cancer Neg Hx     BP 136/88   Pulse 68   Ht 5\' 8"  (1.727 m)   Wt 220 lb (99.8 kg)   SpO2 97%   BMI 33.45 kg/m   Review of Systems He has weight gain.      Objective:   Physical Exam VITAL SIGNS:  See vs page GENERAL: no distress Right leg laceration is healed.  Pulses: dorsalis pedis intact bilat.  MSK: no deformity of the feet.  CV: trace bilat  leg edema, and bilat varicosities.  Skin: no ulcer on the feet. normal color and temp on the feet.  Neuro: sensation is intact to touch on the feet, but decreased from normal.  Ext: There is bilateral onychomycosis of the toenails.   (pt says a1c was 8.6%, done by ins company)    Assessment & Plan:  Insulin-requiring type 2 DM, with PAD: worse.   Noncompliance with cbg recording. This complicates the rx of DM.   Chronic pain syndrome: goal is to minimize narcotic rx.    Patient Instructions  Please increase the lantus to 85 units  each morning.  check your blood sugar twice a day.  vary the time of day when you check, between before the 3 meals, and at bedtime.  also check if you have symptoms of your blood sugar being too high or too low.  please keep a record of the readings and bring it to your next appointment here (or you can bring the meter itself).  You can write it on any piece of paper.  please call us sooner if your blood sugar goes below 70, or if you have a lot of readings over 200.  good diet and exercise significantly improve the control of your diabetes.  please let me know if you wish to be referred to a dietician.   I have sent a prescription to your pharmacy, to start the gabapentin.  Our goal is to lessen your need for the vicodin. Please come back for a follow-up appointment in 2 months.

## 2016-11-13 DIAGNOSIS — H11222 Conjunctival granuloma, left eye: Secondary | ICD-10-CM | POA: Diagnosis not present

## 2016-11-13 DIAGNOSIS — Z48810 Encounter for surgical aftercare following surgery on the sense organs: Secondary | ICD-10-CM | POA: Diagnosis not present

## 2016-11-17 DIAGNOSIS — Z720 Tobacco use: Secondary | ICD-10-CM | POA: Diagnosis not present

## 2016-11-17 DIAGNOSIS — L732 Hidradenitis suppurativa: Secondary | ICD-10-CM | POA: Diagnosis not present

## 2016-11-25 ENCOUNTER — Ambulatory Visit (INDEPENDENT_AMBULATORY_CARE_PROVIDER_SITE_OTHER): Payer: Medicare Other | Admitting: Cardiovascular Disease

## 2016-11-25 ENCOUNTER — Encounter: Payer: Self-pay | Admitting: Cardiovascular Disease

## 2016-11-25 VITALS — BP 152/80 | HR 85 | Ht 68.0 in | Wt 221.0 lb

## 2016-11-25 DIAGNOSIS — E785 Hyperlipidemia, unspecified: Secondary | ICD-10-CM | POA: Diagnosis not present

## 2016-11-25 DIAGNOSIS — I10 Essential (primary) hypertension: Secondary | ICD-10-CM

## 2016-11-25 DIAGNOSIS — I739 Peripheral vascular disease, unspecified: Secondary | ICD-10-CM | POA: Diagnosis not present

## 2016-11-25 DIAGNOSIS — I2510 Atherosclerotic heart disease of native coronary artery without angina pectoris: Secondary | ICD-10-CM | POA: Diagnosis not present

## 2016-11-25 MED ORDER — ASPIRIN EC 81 MG PO TBEC: 81.0000 mg | DELAYED_RELEASE_TABLET | Freq: Every day | ORAL | Status: DC

## 2016-11-25 NOTE — Patient Instructions (Signed)
Medication Instructions:  Your physician has recommended you make the following change in your medication:  1. START Aspirin 81mg  take one tablet by mouth daily  Labwork: No new orders.   Testing/Procedures: No new orders.   Follow-Up: Your physician wants you to follow-up in: 1 YEAR with Dr Fletcher Anon.  You will receive a reminder letter in the mail two months in advance. If you don't receive a letter, please call our office to schedule the follow-up appointment.   Any Other Special Instructions Will Be Listed Below (If Applicable).     If you need a refill on your cardiac medications before your next appointment, please call your pharmacy.

## 2016-11-25 NOTE — Progress Notes (Signed)
Cardiology Office Note   Date:  11/25/2016   ID:  Paul, [name] [birth year], MRN [id number]  PCP:  Paul Shin, MD  Cardiologist:   Paul Sacramento, MD   Chief Complaint  Patient presents with  . Follow-up    only concern is cough that causes chest pain,       History of Present Illness:  [name] is a 61 y.o. male who presents for a follow up visit regarding PAD. He has known history of insulin-requiring DM (dx'ed 1992),  Hyperlipidemia, hypertension and tobacco use. He is followed for right calf claudication due to occluded right SFA. Noninvasive evaluation in August 2015 showed ABI: normal on the left and 0.44 on right.  Duplex showed long occlusion of right SFA from mid to distal segment with reconstitution in popliteal artery. There was significant left SFA stenosis.   He had atypical chest pain in late 2015. Nuclear stress test showed a small inferolateral infarct with no significant ischemia. He was treated medically given that his chest pain resolved.   He quit smoking in December 2017. He is currently dealing with symptoms of upper respiratory tract infection with significant cough. He describes chest pain with coughing but he denies any chest tightness or other exertional activities. He stopped taking cilostazol and did not get any difference. He denies claudication at the present time.  Past Medical History:  Diagnosis Date  . Allergic rhinitis   . Arthritis   . At risk for sleep apnea    STOP-BANG= 5   SENT TO PCP 03-14-2014  . Condyloma acuminatum of penis   . Diabetic neuropathy ([place])   . GERD (gastroesophageal reflux disease)   . History of bladder cancer    s/p  turbt  2013/   transitional cell carcinoma--   . History of condyloma acuminatum    PERINEAL AREA  W/ RECURRENCY  . History of gout   . Hyperlipidemia   . Hypertension   . Lower urinary tract symptoms (LUTS)   . Productive cough   . PVD (peripheral vascular disease) with claudication  (HCC)    bilateral SFA disease-- right > left  and left tibial artery disease--  per duplex  . Smokers' cough ([place])   . Type 2 diabetes mellitus with insulin therapy ([place])    monitor by  dr ellsion  . Wears dentures    upper    Past Surgical History:  Procedure Laterality Date  . AXILLARY HIDRADENITIS EXCISION  1997  . CARDIOVASCULAR STRESS TEST  07-24-2014  dr [name]   Low risk lexiscan nuclear study with apical thinning and small inferolateral wall infarct at mid & basal level , no ischemia/  normal LVF and wall motion , ef 59%  . CATARACT EXTRACTION W/ INTRAOCULAR LENS IMPLANT Right   . CO2 LASER APPLICATION N/A 10/04/8248   Procedure: CO2 LASER APPLICATION,PENIS, GROIN, ANUS;  Surgeon: Paul Hector, MD;  Location: [place];  Service: General;  Laterality: N/A;  . CO2 LASER APPLICATION N/A 03/70/4888   Procedure: CO2 LASER APPLICATION;  Surgeon: Paul Rhodes, MD;  Location: [facility];  Service: Urology;  Laterality: N/A;  . CONDYLOMA EXCISION/FULGURATION N/A 05/21/2015   Procedure: CONDYLOMA REMOVAL;  Surgeon: Paul Rhodes, MD;  Location: [facility];  Service: Urology;  Laterality: N/A;  . HEMORRHOID SURGERY  10/24/2014   Procedure: HEMORRHOIDECTOMY;  Surgeon: Paul Boston, MD;  Location: [facility];  Service: General;;  . INCISION  AND DRAINAGE ABSCESS Left 10/24/2014   Procedure: INCISION AND DRAINAGE ABSCESS;  Surgeon: Paul Boston, MD;  Location: [place];  Service: General;  Laterality: Left;  . INGUINAL HIDRADENITIS EXCISION  1998, 1999  . LASER ABLATION CONDOLAMATA N/A 03/20/2014   Procedure: EXAM UNDER ANESTHESIA, REMOVAL/ABLATION OF CONDYLOMATA PENIS,GROINS, ANUS, ANAL CANAL;  Surgeon: Paul Hector, MD;  Location: [place];  Service: General;  Laterality: N/A;  groin and anus  . LASER ABLATION CONDOLAMATA N/A 10/24/2014   Procedure: LASER ABLATION CONDOLAMATA;   Surgeon: Paul Boston, MD;  Location: [facility];  Service: General;  Laterality: N/A;  . LASER ABLATION OF PENILE AND PERIANAL WARTS  07-29-2007  Dr. Johney [name]  . LEFT SHOULDER SURGERY  2003  . MASS EXCISION N/A 10/24/2014   Procedure: EXCISION OF PERINEAL MASS/SINUS;  Surgeon: Paul Boston, MD;  Location: [place];  Service: General;  Laterality: N/A;  . [place]  . TRANSURETHRAL RESECTION OF BLADDER TUMOR  05/21/2012   Procedure: TRANSURETHRAL RESECTION OF BLADDER TUMOR (TURBT);  Surgeon: Paul Jabs, MD;  Location: [facility];  Service: Urology;  Laterality: N/A;        Current Outpatient Prescriptions  Medication Sig Dispense Refill  . atorvastatin (LIPITOR) 20 MG tablet TAKE ONE TABLET BY MOUTH ONCE DAILY 90 tablet 1  . cilostazol (PLETAL) 100 MG tablet Take 1 tablet (100 mg total) by mouth 2 (two) times daily. 60 tablet 11  . HYDROcodone-acetaminophen (NORCO/VICODIN) 5-325 MG tablet Take 1 tablet by mouth every 6 (six) hours as needed for moderate pain. 100 tablet 0  . Insulin Glargine (LANTUS SOLOSTAR) 100 UNIT/ML Solostar Pen Inject 85 Units into the skin every morning. And pen needles 1/day 10 pen PRN  . losartan-hydrochlorothiazide (HYZAAR) 100-12.5 MG tablet Take 1 tablet by mouth daily. 90 tablet 1   No current facility-administered medications for this visit.     Allergies:   Patient has no known allergies.    Social History:  The patient  reports that he has quit smoking. His smoking use included Cigars and Cigarettes. He has a 41.00 pack-year smoking history. He has never used smokeless tobacco. He reports that he does not drink alcohol or use drugs.   Family History:  The patient's family history includes Cancer in his father; Diabetes in his maternal aunt; Hypertension in his mother.    ROS:  Please see the history of present illness.   Otherwise, review of systems are positive for none.    All other systems are reviewed and negative.    PHYSICAL EXAM: VS:  BP (!) 152/80   Pulse 85   Ht 5\' 8"  (1.727 m)   Wt 221 lb (100.2 kg)   BMI 33.60 kg/m  , BMI Body mass index is 33.6 kg/m. GEN: Well nourished, well developed, in no acute distress  HEENT: normal  Neck: no JVD, carotid bruits, or masses Cardiac: RRR; no murmurs, rubs, or gallops,no edema  Respiratory:  clear to auscultation bilaterally, normal work of breathing GI: soft, nontender, nondistended, + BS MS: no deformity or atrophy  Skin: warm and dry, no rash Neuro:  Strength and sensation are intact Psych: euthymic mood, full affect   EKG:  EKG is ordered today. EKG showed normal sinus rhythm with no significant ST or T wave changes.   Recent Labs: 12/31/2015: ALT 22; BUN 10; Creatinine, Ser 0.95; Hemoglobin 15.8; Platelets 288.0; Potassium 3.9; Sodium 136; TSH 1.16  Lipid Panel    Component Value Date/Time   CHOL 108 12/31/2015 1334   TRIG 78.0 12/31/2015 1334   HDL 30.90 (L) 12/31/2015 1334   CHOLHDL 3 12/31/2015 1334   VLDL 15.6 12/31/2015 1334   LDLCALC 61 12/31/2015 1334      Wt Readings from Last 3 Encounters:  11/25/16 221 lb (100.2 kg)  11/06/16 220 lb (99.8 kg)  06/13/16 201 lb (91.2 kg)       ASSESSMENT AND PLAN:  1.  Peripheral arterial disease: Known occluded right SFA .He denies claudication at the present time. Continue medical therapy. He stopped taking cilostazol and thus I added aspirin 81 mg once daily.  2. Coronary artery disease involving native coronary arteries without angina: Previous stress test in 2015 showed evidence of a small prior inferolateral infarct. No anginal symptoms. Aspirin 81 mg once daily was added.  3. Essential hypertension: Blood pressure continues to be mildly elevated. He might require increasing the dose of hydrochlorothiazide or adding amlodipine.  4. Hyperlipidemia: Continue treatment with atorvastatin with a target LDL of less than 70. Most  recent LDL was 61.  5. Previous tobacco use: I congratulated him on smoking cessation.     Disposition:   FU with me in 1 year  Signed,  Paul Sacramento, MD  11/25/2016 9:44 AM    [place]

## 2016-12-01 ENCOUNTER — Telehealth: Payer: Self-pay | Admitting: Endocrinology

## 2016-12-01 MED ORDER — HYDROCODONE-ACETAMINOPHEN 5-325 MG PO TABS: 1.0000 | ORAL_TABLET | Freq: Four times a day (QID) | ORAL | 0 refills | Status: DC | PRN

## 2016-12-01 NOTE — Telephone Encounter (Signed)
Unable to locate the printed rx. Could we print another rx?

## 2016-12-01 NOTE — Telephone Encounter (Signed)
stephanie already gave pt rx

## 2016-12-01 NOTE — Telephone Encounter (Signed)
Noted, thanks!

## 2016-12-01 NOTE — Telephone Encounter (Signed)
Pt needs rx for the hydrocodone

## 2016-12-01 NOTE — Telephone Encounter (Signed)
I printed  

## 2016-12-02 ENCOUNTER — Telehealth: Payer: Self-pay | Admitting: Cardiovascular Disease

## 2016-12-02 NOTE — Telephone Encounter (Signed)
Pt reports he stopped pletal at the end of February for surgery. He did not start back as was noted at April 17 OV w/Dr. Fletcher Anon; 81mg  aspirin prescribed. Pt asks if he should restart pletal. Advised pt to only take 81mg  aspirin as it was prescribed since pletal d/c'd. Pt verbalized understanding.

## 2016-12-02 NOTE — Telephone Encounter (Signed)
Pt wife has a question regarding taking his aspirin and Cilostacol. Please call.

## 2017-01-08 ENCOUNTER — Encounter: Payer: Self-pay | Admitting: Endocrinology

## 2017-01-08 ENCOUNTER — Ambulatory Visit (INDEPENDENT_AMBULATORY_CARE_PROVIDER_SITE_OTHER): Payer: Medicare Other | Admitting: Endocrinology

## 2017-01-08 VITALS — BP 156/86 | HR 83 | Ht 68.0 in | Wt 223.0 lb

## 2017-01-08 DIAGNOSIS — E1151 Type 2 diabetes mellitus with diabetic peripheral angiopathy without gangrene: Secondary | ICD-10-CM | POA: Diagnosis not present

## 2017-01-08 DIAGNOSIS — Z794 Long term (current) use of insulin: Secondary | ICD-10-CM

## 2017-01-08 DIAGNOSIS — Z125 Encounter for screening for malignant neoplasm of prostate: Secondary | ICD-10-CM | POA: Diagnosis not present

## 2017-01-08 DIAGNOSIS — Z0001 Encounter for general adult medical examination with abnormal findings: Secondary | ICD-10-CM

## 2017-01-08 LAB — CBC WITH DIFFERENTIAL/PLATELET
BASOS ABS: 0.1 10*3/uL (ref 0.0–0.1)
Basophils Relative: 1 % (ref 0.0–3.0)
EOS ABS: 0.8 10*3/uL — AB (ref 0.0–0.7)
Eosinophils Relative: 6.2 % — ABNORMAL HIGH (ref 0.0–5.0)
HEMATOCRIT: 44.4 % (ref 39.0–52.0)
HEMOGLOBIN: 15.7 g/dL (ref 13.0–17.0)
LYMPHS PCT: 25.3 % (ref 12.0–46.0)
Lymphs Abs: 3.1 10*3/uL (ref 0.7–4.0)
MCHC: 35.4 g/dL (ref 30.0–36.0)
MCV: 91.7 fl (ref 78.0–100.0)
MONOS PCT: 8.3 % (ref 3.0–12.0)
Monocytes Absolute: 1 10*3/uL (ref 0.1–1.0)
NEUTROS ABS: 7.2 10*3/uL (ref 1.4–7.7)
Neutrophils Relative %: 59.2 % (ref 43.0–77.0)
Platelets: 285 10*3/uL (ref 150.0–400.0)
RBC: 4.84 Mil/uL (ref 4.22–5.81)
RDW: 12.7 % (ref 11.5–15.5)
WBC: 12.2 10*3/uL — AB (ref 4.0–10.5)

## 2017-01-08 LAB — HEPATIC FUNCTION PANEL
ALBUMIN: 4.2 g/dL (ref 3.5–5.2)
ALK PHOS: 95 U/L (ref 39–117)
ALT: 51 U/L (ref 0–53)
AST: 36 U/L (ref 0–37)
BILIRUBIN DIRECT: 0.2 mg/dL (ref 0.0–0.3)
TOTAL PROTEIN: 7.2 g/dL (ref 6.0–8.3)
Total Bilirubin: 0.9 mg/dL (ref 0.2–1.2)

## 2017-01-08 LAB — LDL CHOLESTEROL, DIRECT: LDL DIRECT: 67 mg/dL

## 2017-01-08 LAB — BASIC METABOLIC PANEL
BUN: 14 mg/dL (ref 6–23)
CALCIUM: 9.7 mg/dL (ref 8.4–10.5)
CO2: 30 mEq/L (ref 19–32)
Chloride: 99 mEq/L (ref 96–112)
Creatinine, Ser: 1.1 mg/dL (ref 0.40–1.50)
GFR: 72.51 mL/min (ref >60.00)
GLUCOSE: 303 mg/dL — AB (ref 70–99)
POTASSIUM: 3.9 meq/L (ref 3.5–5.1)
SODIUM: 137 meq/L (ref 135–145)

## 2017-01-08 LAB — MICROALBUMIN / CREATININE URINE RATIO
CREATININE, U: 52.7 mg/dL
MICROALB UR: 5.6 mg/dL — AB (ref 0.0–1.9)
MICROALB/CREAT RATIO: 10.6 mg/g (ref 0.0–30.0)

## 2017-01-08 LAB — TSH: TSH: 1.41 u[IU]/mL (ref 0.35–4.50)

## 2017-01-08 LAB — LIPID PANEL
Cholesterol: 129 mg/dL (ref 0–200)
HDL: 32.2 mg/dL — AB (ref >39.00)
NONHDL: 96.65
Total CHOL/HDL Ratio: 4
Triglycerides: 234 mg/dL — ABNORMAL HIGH (ref 0.0–149.0)
VLDL: 46.8 mg/dL — ABNORMAL HIGH (ref 0.0–40.0)

## 2017-01-08 LAB — PSA: PSA: 1.27 ng/mL (ref 0.10–4.00)

## 2017-01-08 LAB — POCT GLYCOSYLATED HEMOGLOBIN (HGB A1C): Hemoglobin A1C: 10.8

## 2017-01-08 MED ORDER — INSULIN GLARGINE 100 UNIT/ML SOLOSTAR PEN: 100.0000 [IU] | PEN_INJECTOR | SUBCUTANEOUS | 11 refills | Status: DC

## 2017-01-08 MED ORDER — HYDROCODONE-ACETAMINOPHEN 5-325 MG PO TABS: 1.0000 | ORAL_TABLET | Freq: Four times a day (QID) | ORAL | 0 refills | Status: DC | PRN

## 2017-01-08 MED ORDER — GABAPENTIN 300 MG PO CAPS: 300.0000 mg | ORAL_CAPSULE | Freq: Two times a day (BID) | ORAL | 5 refills | Status: DC

## 2017-01-08 NOTE — Patient Instructions (Addendum)
Please increase the lantus to 100 units each morning.  check your blood sugar twice a day.  vary the time of day when you check, between before the 3 meals, and at bedtime.  also check if you have symptoms of your blood sugar being too high or too low.  please keep a record of the readings and bring it to your next appointment here (or you can bring the meter itself).  You can write it on any piece of paper.  please call us sooner if your blood sugar goes below 70, or if you have a lot of readings over 200.  good diet and exercise significantly improve the control of your diabetes.  please let me know if you wish to be referred to a dietician.   I have sent a prescription to your pharmacy, to start the gabapentin.  Our goal is to lessen your need for the vicodin. Please come back for a follow-up appointment in 3 months.  If you still have heartburn then you should consider a prescription for it.    I have sent a prescription to your pharmacy, for the gabapentin.  Our goal is to lessen your need for the vicodin. Please continue the same medication for blood pressure. blood tests are requested for you today.  We'll let you know about the results.

## 2017-01-08 NOTE — Progress Notes (Signed)
Subjective:    Patient ID: [name], male    DOB: 26-Nov-1955, 61 y.o.   MRN: [id number]  HPI Pt is here for regular wellness examination, and is feeling pretty well in general, and says chronic med probs are stable, except as noted below. Past Medical History:  Diagnosis Date  . Allergic rhinitis   . Arthritis   . At risk for sleep apnea    STOP-BANG= 5   SENT TO PCP 03-14-2014  . Condyloma acuminatum of penis   . Diabetic neuropathy ([place])   . GERD (gastroesophageal reflux disease)   . History of bladder cancer    s/p  turbt  2013/   transitional cell carcinoma--   . History of condyloma acuminatum    PERINEAL AREA  W/ RECURRENCY  . History of gout   . Hyperlipidemia   . Hypertension   . Lower urinary tract symptoms (LUTS)   . Productive cough   . PVD (peripheral vascular disease) with claudication (HCC)    bilateral SFA disease-- right > left  and left tibial artery disease--  per duplex  . Smokers' cough ([place])   . Type 2 diabetes mellitus with insulin therapy ([place])    monitor by  dr ellsion  . Wears dentures    upper    Past Surgical History:  Procedure Laterality Date  . AXILLARY HIDRADENITIS EXCISION  1997  . CARDIOVASCULAR STRESS TEST  07-24-2014  dr Kathlyn [place]   Low risk lexiscan nuclear study with apical thinning and small inferolateral wall infarct at mid & basal level , no ischemia/  normal LVF and wall motion , ef 59%  . CATARACT EXTRACTION W/ INTRAOCULAR LENS IMPLANT Right   . CO2 LASER APPLICATION N/A 2/69/4854   Procedure: CO2 LASER APPLICATION,PENIS, GROIN, ANUS;  Surgeon: [name], MD;  Location: [place];  Service: General;  Laterality: N/A;  . CO2 LASER APPLICATION N/A 62/70/3500   Procedure: CO2 LASER APPLICATION;  Surgeon: Kathie Rhodes, MD;  Location: [facility];  Service: Urology;  Laterality: N/A;  . CONDYLOMA EXCISION/FULGURATION N/A 05/21/2015   Procedure: CONDYLOMA REMOVAL;  Surgeon: Kathie Rhodes,  MD;  Location: [facility];  Service: Urology;  Laterality: N/A;  . HEMORRHOID SURGERY  10/24/2014   Procedure: HEMORRHOIDECTOMY;  Surgeon: Michael Boston, MD;  Location: [facility];  Service: General;;  . INCISION AND DRAINAGE ABSCESS Left 10/24/2014   Procedure: INCISION AND DRAINAGE ABSCESS;  Surgeon: Michael Boston, MD;  Location: [place];  Service: General;  Laterality: Left;  . INGUINAL HIDRADENITIS EXCISION  1998, 1999  . LASER ABLATION CONDOLAMATA N/A 03/20/2014   Procedure: EXAM UNDER ANESTHESIA, REMOVAL/ABLATION OF CONDYLOMATA PENIS,GROINS, ANUS, ANAL CANAL;  Surgeon: [name], MD;  Location: [place];  Service: General;  Laterality: N/A;  groin and anus  . LASER ABLATION CONDOLAMATA N/A 10/24/2014   Procedure: LASER ABLATION CONDOLAMATA;  Surgeon: Michael Boston, MD;  Location: [facility];  Service: General;  Laterality: N/A;  . LASER ABLATION OF PENILE AND PERIANAL WARTS  07-29-2007  Dr. Johney Maine  . LEFT SHOULDER SURGERY  2003  . MASS EXCISION N/A 10/24/2014   Procedure: EXCISION OF PERINEAL MASS/SINUS;  Surgeon: Michael Boston, MD;  Location: [place];  Service: General;  Laterality: N/A;  . [place]  . TRANSURETHRAL RESECTION OF BLADDER TUMOR  05/21/2012   Procedure: TRANSURETHRAL RESECTION OF BLADDER TUMOR (TURBT);  Surgeon: Elta Guadeloupe  Nedra Hai, MD;  Location: [facility];  Service: Urology;  Laterality: N/A;       Social History   Social History  . Marital status: Married    Spouse name: N/A  . Number of children: N/A  . Years of education: N/A   Occupational History  . Disabled    Social History Main Topics  . Smoking status: Former Smoker    Packs/day: 1.00    Years: 41.00    Types: Cigars, Cigarettes  . Smokeless tobacco: Never Used  . Alcohol use No  . Drug use: No  . Sexual activity: Not on file   Other Topics Concern    . Not on file   Social History Narrative  . No narrative on file    Current Outpatient Prescriptions on File Prior to Visit  Medication Sig Dispense Refill  . aspirin EC 81 MG tablet Take 1 tablet (81 mg total) by mouth daily.    Marland Kitchen atorvastatin (LIPITOR) 20 MG tablet TAKE ONE TABLET BY MOUTH ONCE DAILY 90 tablet 1  . losartan-hydrochlorothiazide (HYZAAR) 100-12.5 MG tablet Take 1 tablet by mouth daily. 90 tablet 1   No current facility-administered medications on file prior to visit.     No Known Allergies  Family History  Problem Relation Age of Onset  . Hypertension Mother   . Cancer Father        lung ca  . Diabetes Maternal Aunt        x 2  . Colon cancer Neg Hx   . Esophageal cancer Neg Hx   . Pancreatic cancer Neg Hx   . Prostate cancer Neg Hx   . Kidney disease Neg Hx   . Liver disease Neg Hx   . Lung cancer Neg Hx     BP (!) 156/86   Pulse 83   Ht 5\' 8"  (1.727 m)   Wt 223 lb (101.2 kg)   SpO2 96%   BMI 33.91 kg/m   Review of Systems Constitutional: Negative for fever. he has gained weight.  HENT: Negative for hearing loss.   Eyes: positive for visual disturbance (recent surgery).  Respiratory: Negative for shortness of breath.   Cardiovascular: Negative for chest pain.  Gastrointestinal: Negative for anal bleeding.  Endocrine: Negative for cold intolerance.  Genitourinary: Negative for hematuria and difficulty urinating.   Musculoskeletal: Negative for back pain.  Skin: Negative for rash.  Allergic/Immunologic: Negative for environmental allergies.  Neurological: Positive for numbness of the feet.  Negative for syncope.  Hematological: Does not bruise/bleed easily.  Psychiatric/Behavioral: Negative for dysphoric mood.     Objective:   Physical Exam VS: see vs page GEN: no distress HEAD: head: no deformity.  Old healed surgical scar at the left malar area eyes: no periorbital swelling, no proptosis external nose and ears are normal mouth: no  lesion seen NECK: supple, thyroid is not enlarged.  CHEST WALL: no deformity LUNGS: clear to auscultation BREASTS:  No gynecomastia CV: reg rate and rhythm, no murmur ABD: abdomen is soft, nontender.  no hepatosplenomegaly.  not distended.  Self-reducing ventral hernia GENITALIA/RECTAL/PROSTATE: sees urology MUSCULOSKELETAL: muscle bulk and strength are grossly normal.  no obvious joint swelling.  gait is normal and steady EXTEMITIES: no deformity.  PULSES: no carotid bruit NEURO:  cn 2-12 grossly intact, except for disconjugate gaze.  readily moves all 4's.   SKIN:  Normal texture and temperature.  No rash or suspicious lesion is visible.   NODES:  None palpable at  the neck PSYCH: alert, well-oriented.  Does not appear anxious nor depressed.        Assessment & Plan:  Wellness visit today, with problems stable, except as noted.  SEPARATE EVALUATION FOLLOWS--EACH PROBLEM HERE IS NEW, NOT RESPONDING TO TREATMENT, OR POSES SIGNIFICANT RISK TO THE PATIENT'S HEALTH: HISTORY OF THE PRESENT ILLNESS: Pt returns for f/u of diabetes mellitus: DM type: Insulin-requiring type 2 Dx'ed: 2683 Complications: PAD and polyneuropathy.  Therapy: insulin since 2009 DKA: never Severe hypoglycemia: never Pancreatitis: never Other: he takes a QD insulin regimen, after poor results with multiple daily injections, and then also with BID insulin.  Interval history: he says he never misses the insulin.  Meter is downloaded today, and the printout is scanned into the record.  It varies from 160-500.  There is no trend throughout the day. He takes 80 units qam.   Chronic left shoulder pain persists. He says a topical product caused a rash. He did not take gabapentin.    PAST MEDICAL HISTORY Past Medical History:  Diagnosis Date  . Allergic rhinitis   . Arthritis   . At risk for sleep apnea    STOP-BANG= 5   SENT TO PCP 03-14-2014  . Condyloma acuminatum of penis   . Diabetic neuropathy ([place])   .  GERD (gastroesophageal reflux disease)   . History of bladder cancer    s/p  turbt  2013/   transitional cell carcinoma--   . History of condyloma acuminatum    PERINEAL AREA  W/ RECURRENCY  . History of gout   . Hyperlipidemia   . Hypertension   . Lower urinary tract symptoms (LUTS)   . Productive cough   . PVD (peripheral vascular disease) with claudication (HCC)    bilateral SFA disease-- right > left  and left tibial artery disease--  per duplex  . Smokers' cough ([place])   . Type 2 diabetes mellitus with insulin therapy ([place])    monitor by  dr ellsion  . Wears dentures    upper    Past Surgical History:  Procedure Laterality Date  . AXILLARY HIDRADENITIS EXCISION  1997  . CARDIOVASCULAR STRESS TEST  07-24-2014  dr Kathlyn [place]   Low risk lexiscan nuclear study with apical thinning and small inferolateral wall infarct at mid & basal level , no ischemia/  normal LVF and wall motion , ef 59%  . CATARACT EXTRACTION W/ INTRAOCULAR LENS IMPLANT Right   . CO2 LASER APPLICATION N/A 11/27/6220   Procedure: CO2 LASER APPLICATION,PENIS, GROIN, ANUS;  Surgeon: [name], MD;  Location: [place];  Service: General;  Laterality: N/A;  . CO2 LASER APPLICATION N/A 97/98/9211   Procedure: CO2 LASER APPLICATION;  Surgeon: Kathie Rhodes, MD;  Location: [facility];  Service: Urology;  Laterality: N/A;  . CONDYLOMA EXCISION/FULGURATION N/A 05/21/2015   Procedure: CONDYLOMA REMOVAL;  Surgeon: Kathie Rhodes, MD;  Location: [facility];  Service: Urology;  Laterality: N/A;  . HEMORRHOID SURGERY  10/24/2014   Procedure: HEMORRHOIDECTOMY;  Surgeon: Michael Boston, MD;  Location: [facility];  Service: General;;  . INCISION AND DRAINAGE ABSCESS Left 10/24/2014   Procedure: INCISION AND DRAINAGE ABSCESS;  Surgeon: Michael Boston, MD;  Location: [place];  Service: General;  Laterality: Left;  . INGUINAL HIDRADENITIS EXCISION   1998, 1999  . LASER ABLATION CONDOLAMATA N/A 03/20/2014   Procedure: EXAM UNDER ANESTHESIA, REMOVAL/ABLATION OF CONDYLOMATA PENIS,GROINS, ANUS, ANAL CANAL;  Surgeon: [name], MD;  Location: [place];  Service: General;  Laterality: N/A;  groin and anus  . LASER ABLATION CONDOLAMATA N/A 10/24/2014   Procedure: LASER ABLATION CONDOLAMATA;  Surgeon: Michael Boston, MD;  Location: [facility];  Service: General;  Laterality: N/A;  . LASER ABLATION OF PENILE AND PERIANAL WARTS  07-29-2007  Dr. Johney Maine  . LEFT SHOULDER SURGERY  2003  . MASS EXCISION N/A 10/24/2014   Procedure: EXCISION OF PERINEAL MASS/SINUS;  Surgeon: Michael Boston, MD;  Location: [place];  Service: General;  Laterality: N/A;  . [place]  . TRANSURETHRAL RESECTION OF BLADDER TUMOR  05/21/2012   Procedure: TRANSURETHRAL RESECTION OF BLADDER TUMOR (TURBT);  Surgeon: Claybon Jabs, MD;  Location: [facility];  Service: Urology;  Laterality: N/A;       Social History   Social History  . Marital status: Married    Spouse name: N/A  . Number of children: N/A  . Years of education: N/A   Occupational History  . Disabled    Social History Main Topics  . Smoking status: Former Smoker    Packs/day: 1.00    Years: 41.00    Types: Cigars, Cigarettes  . Smokeless tobacco: Never Used  . Alcohol use No  . Drug use: No  . Sexual activity: Not on file   Other Topics Concern  . Not on file   Social History Narrative  . No narrative on file    Current Outpatient Prescriptions on File Prior to Visit  Medication Sig Dispense Refill  . aspirin EC 81 MG tablet Take 1 tablet (81 mg total) by mouth daily.    Marland Kitchen atorvastatin (LIPITOR) 20 MG tablet TAKE ONE TABLET BY MOUTH ONCE DAILY 90 tablet 1  . losartan-hydrochlorothiazide (HYZAAR) 100-12.5 MG tablet Take 1 tablet by mouth daily. 90 tablet 1   No current facility-administered  medications on file prior to visit.     No Known Allergies  Family History  Problem Relation Age of Onset  . Hypertension Mother   . Cancer Father        lung ca  . Diabetes Maternal Aunt        x 2  . Colon cancer Neg Hx   . Esophageal cancer Neg Hx   . Pancreatic cancer Neg Hx   . Prostate cancer Neg Hx   . Kidney disease Neg Hx   . Liver disease Neg Hx   . Lung cancer Neg Hx     BP (!) 156/86   Pulse 83   Ht 5\' 8"  (1.727 m)   Wt 223 lb (101.2 kg)   SpO2 96%   BMI 33.91 kg/m   REVIEW OF SYSTEMS: He has heartburn at night.  No dysphagia PHYSICAL EXAMINATION: VITAL SIGNS:  See vs page GENERAL: no distress Right leg laceration is healed.  Pulses: dorsalis pedis intact bilat.  MSK: no deformity of the feet.  CV: trace bilat leg edema, and bilat varicosities.  Skin: no ulcer on the feet. normal color and temp on the feet.  Neuro: sensation is intact to touch on the feet, but decreased from normal.  Ext: There is bilateral onychomycosis of the toenails.  LAB/XRAY RESULTS:  A1c=10.4% IMPRESSION: Shoulder pain, persistent.  GERD: new.  Insulin-requiring type 2 DM, with PAD: worse HTN: with poss situational component. PLAN:  He declines to take PPI (he prefers tums) Please increase the lantus to 100 units each morning.   I have  sent a prescription to your pharmacy, to start the gabapentin.  Our goal is to lessen your need for the vicodin. Please come back for a follow-up appointment in 3 months.  If you still have heartburn then you should consider a prescription for it.    Please continue the same medication for blood pressure.

## 2017-01-15 ENCOUNTER — Other Ambulatory Visit: Payer: Self-pay | Admitting: Endocrinology

## 2017-01-26 ENCOUNTER — Telehealth: Payer: Self-pay | Admitting: Endocrinology

## 2017-01-26 NOTE — Telephone Encounter (Signed)
See message. Could we try Basaglar? Thanks!

## 2017-01-26 NOTE — Telephone Encounter (Signed)
We need to know what his best covered by his insurance, needs Engineer, agricultural or Toujeo or [place]

## 2017-01-26 NOTE — Telephone Encounter (Signed)
He can also take the same dose of 100 units Toujeo as the Lantus

## 2017-01-26 NOTE — Telephone Encounter (Signed)
He can try Novolin N with a syringe and he needs to get this OTC at [facility].  He can take 25 units at breakfast and lunch and 50 units at suppertime

## 2017-01-26 NOTE — Telephone Encounter (Signed)
Upon review, toujeo is the covered alternative.

## 2017-01-26 NOTE — Telephone Encounter (Signed)
I contacted the patient and advised of message. He wanted to know if it was possible if he could switched to Novolin N due to the price only being 25 $? Patient stated he is on a fixed income and stated the Novolin would be a lot cheaper for him. Please advise, Thanks!

## 2017-01-26 NOTE — Telephone Encounter (Signed)
Patient called to advise that the Insulin Glargine (LANTUS SOLOSTAR) 100 UNIT/ML Solostar Pen is too expensive ($340). Patient needs an alternative that is not expensive. Please call patient to discuss.

## 2017-01-27 NOTE — Telephone Encounter (Signed)
I contacted the patient and advised of instructions. Patient voiced understanding and had no further questions at this time.

## 2017-01-28 ENCOUNTER — Other Ambulatory Visit: Payer: Self-pay

## 2017-01-28 MED ORDER — INSULIN NPH (HUMAN) (ISOPHANE) 100 UNIT/ML ~~LOC~~ SUSP: SUBCUTANEOUS | 3 refills | Status: DC

## 2017-01-28 NOTE — Telephone Encounter (Signed)
I spoke with the patient and he advised [facility] needed to original prescription for the Novolin N before they could dispense the medication to the pt. Rx submitted.

## 2017-01-28 NOTE — Telephone Encounter (Signed)
Patient called to inquire about medication Novolin. Transferred the call to [place]

## 2017-02-02 DIAGNOSIS — H02105 Unspecified ectropion of left lower eyelid: Secondary | ICD-10-CM | POA: Diagnosis not present

## 2017-02-03 ENCOUNTER — Telehealth: Payer: Self-pay

## 2017-02-03 ENCOUNTER — Telehealth: Payer: Self-pay | Admitting: Endocrinology

## 2017-02-03 NOTE — Telephone Encounter (Signed)
Pt missed call and not sure from who'm. Wife "Marcie Bal" alled in and would like a call back.  Thank you,  -LL

## 2017-02-03 NOTE — Telephone Encounter (Signed)
Called and LVM advising wife that I am not sure what the previous call was regarding, but if she had questions regarding his health to call the office, gave call back number.

## 2017-02-03 NOTE — Telephone Encounter (Signed)
I called and LVM advising patient I was unsure of the previous call, but advised her to call us if she had any questions regarding patients health. Thanks!

## 2017-02-05 ENCOUNTER — Ambulatory Visit: Payer: Medicare Other | Admitting: Cardiovascular Disease

## 2017-02-09 ENCOUNTER — Telehealth: Payer: Self-pay | Admitting: Endocrinology

## 2017-02-09 MED ORDER — HYDROCODONE-ACETAMINOPHEN 5-325 MG PO TABS: 1.0000 | ORAL_TABLET | Freq: Four times a day (QID) | ORAL | 0 refills | Status: DC | PRN

## 2017-02-09 NOTE — Telephone Encounter (Signed)
**  Remind patient they can make refill requests via MyChart**  Medication refill request (Name & Dosage):  HYDROcodone-acetaminophen (NORCO/VICODIN) 5-325 MG tablet  Preferred pharmacy (Name & Address):   Notify patient once it is ready for pick-up

## 2017-02-09 NOTE — Telephone Encounter (Signed)
Patient notified rx is ready for pick up. Rx placed up front for the patient.

## 2017-02-09 NOTE — Telephone Encounter (Signed)
I printed I have accessed the [place] controlled substances database today.  No suspicious activity

## 2017-02-16 ENCOUNTER — Telehealth: Payer: Self-pay | Admitting: Endocrinology

## 2017-02-16 MED ORDER — INSULIN NPH (HUMAN) (ISOPHANE) 100 UNIT/ML ~~LOC~~ SUSP: SUBCUTANEOUS | 3 refills | Status: DC

## 2017-02-16 NOTE — Addendum Note (Signed)
Addended by: [name] on: 02/16/2017 04:12 PM   Modules accepted: Orders

## 2017-02-16 NOTE — Telephone Encounter (Signed)
Patient notified of message and had no further questions at this time.

## 2017-02-16 NOTE — Telephone Encounter (Signed)
Since the change to novolin n his BS readings have been high the last 4 days he has been ranging to 200-500. Pt tried to pull up his readings and couldn't get it to work  255 this AM

## 2017-02-16 NOTE — Telephone Encounter (Signed)
I contacted the patient and discussed message. He confirmed he is taking Novolin N 25 -25-50. Patient stated his blood sugar has been higher in the evening. Patient stated he thinks his meter may be off. Patient stated he has ordered the solution for the meter and should get the solution in today. Patient stated once he checks his meter he will call back to report if the reading is within range.

## 2017-02-16 NOTE — Telephone Encounter (Signed)
Please increase to 80 units each morning, none at lunch, and 50 units each evening

## 2017-02-16 NOTE — Telephone Encounter (Signed)
For safety, please verify what type of insulin you take, and how much.  Is cbg higher am or pm?

## 2017-02-18 DIAGNOSIS — E109 Type 1 diabetes mellitus without complications: Secondary | ICD-10-CM | POA: Diagnosis not present

## 2017-03-11 ENCOUNTER — Telehealth: Payer: Self-pay | Admitting: Endocrinology

## 2017-03-11 MED ORDER — HYDROCODONE-ACETAMINOPHEN 5-325 MG PO TABS: 1.0000 | ORAL_TABLET | Freq: Four times a day (QID) | ORAL | 0 refills | Status: DC | PRN

## 2017-03-11 NOTE — Telephone Encounter (Signed)
Could you sign during Dr. Ellison's absence? Thanks! 

## 2017-03-11 NOTE — Telephone Encounter (Signed)
Please print prescription and will sign.  Please confirm what he takes this for

## 2017-03-11 NOTE — Telephone Encounter (Signed)
Would you be willing to sign for Dr. Loanne Drilling while he is out of town?

## 2017-03-11 NOTE — Telephone Encounter (Signed)
Please ask Dr Dwyane Dee, I will leave before 1 pm today

## 2017-03-11 NOTE — Telephone Encounter (Signed)
Refill placed on your desk to sign. Patient takes this medication for back pain.

## 2017-03-11 NOTE — Telephone Encounter (Signed)
**  Remind patient they can make refill requests via MyChart**  Medication refill request (Name & Dosage):  HYDROcodone-acetaminophen (NORCO/VICODIN) 5-325 MG tablet  Preferred pharmacy (Name & Address):  [facility]   Other comments (if applicable):  Call patient once rx is ready for pick up

## 2017-03-11 NOTE — Telephone Encounter (Signed)
Left patient a VM that prescription was ready to be picked up at the front desk.

## 2017-04-06 ENCOUNTER — Encounter: Payer: Self-pay | Admitting: Endocrinology

## 2017-04-06 ENCOUNTER — Ambulatory Visit (INDEPENDENT_AMBULATORY_CARE_PROVIDER_SITE_OTHER): Payer: Medicare Other | Admitting: Endocrinology

## 2017-04-06 VITALS — BP 158/92 | HR 66 | Wt 230.2 lb

## 2017-04-06 DIAGNOSIS — M25512 Pain in left shoulder: Secondary | ICD-10-CM

## 2017-04-06 DIAGNOSIS — Z794 Long term (current) use of insulin: Secondary | ICD-10-CM

## 2017-04-06 DIAGNOSIS — E1151 Type 2 diabetes mellitus with diabetic peripheral angiopathy without gangrene: Secondary | ICD-10-CM

## 2017-04-06 LAB — POCT GLYCOSYLATED HEMOGLOBIN (HGB A1C): HEMOGLOBIN A1C: 10.4

## 2017-04-06 MED ORDER — INSULIN NPH (HUMAN) (ISOPHANE) 100 UNIT/ML ~~LOC~~ SUSP: SUBCUTANEOUS | 3 refills | Status: DC

## 2017-04-06 MED ORDER — HYDROCODONE-ACETAMINOPHEN 5-325 MG PO TABS: 1.0000 | ORAL_TABLET | Freq: Four times a day (QID) | ORAL | 0 refills | Status: DC | PRN

## 2017-04-06 NOTE — Patient Instructions (Addendum)
Please come back for a follow-up appointment in 2 months.   Please increase the insulin to 120 units each morning, and 40 units each evening.  I have sent a prescription to your pharmacy check your blood sugar twice a day.  vary the time of day when you check, between before the 3 meals, and at bedtime.  also check if you have symptoms of your blood sugar being too high or too low.  please keep a record of the readings and bring it to your next appointment here (or you can bring the meter itself).  You can write it on any piece of paper.  please call us sooner if your blood sugar goes below 70, or if you have a lot of readings over 200. Here is a refill of the hydrocodone.

## 2017-04-06 NOTE — Progress Notes (Signed)
Subjective:    Patient ID: [name], male    DOB: [birth year], 61 y.o.   MRN: [id number]  HPI Pt returns for f/u of diabetes mellitus: DM type: Insulin-requiring type 2 Dx'ed: 2952 Complications: PAD and polyneuropathy.  Therapy: insulin since 2009 DKA: never Severe hypoglycemia: never Pancreatitis: never Other: he takes a QD insulin regimen, after poor results with multiple daily injections, and then also with BID insulin.  Interval history: he says he never misses the insulin.  Meter is downloaded today, and the printout is scanned into the record.  It varies from 144-200's.  There is no trend throughout the day, but almost all are checked fasting.   He takes 80 units qam and 50 units qpm.  He stopped gabapentin, due to heartburn.  He cannot afford lyrica.   Past Medical History:  Diagnosis Date  . Allergic rhinitis   . Arthritis   . At risk for sleep apnea    STOP-BANG= 5   SENT TO PCP 03-14-2014  . Condyloma acuminatum of penis   . Diabetic neuropathy ([place])   . GERD (gastroesophageal reflux disease)   . History of bladder cancer    s/p  turbt  2013/   transitional cell carcinoma--   . History of condyloma acuminatum    PERINEAL AREA  W/ RECURRENCY  . History of gout   . Hyperlipidemia   . Hypertension   . Lower urinary tract symptoms (LUTS)   . Productive cough   . PVD (peripheral vascular disease) with claudication (HCC)    bilateral SFA disease-- right > left  and left tibial artery disease--  per duplex  . Smokers' cough ([place])   . Type 2 diabetes mellitus with insulin therapy ([place])    monitor by  dr ellsion  . Wears dentures    upper    Past Surgical History:  Procedure Laterality Date  . AXILLARY HIDRADENITIS EXCISION  1997  . CARDIOVASCULAR STRESS TEST  07-24-2014  dr Kathlyn [place]   Low risk lexiscan nuclear study with apical thinning and small inferolateral wall infarct at mid & basal level , no ischemia/  normal LVF and wall motion , ef 59%  .  CATARACT EXTRACTION W/ INTRAOCULAR LENS IMPLANT Right   . CO2 LASER APPLICATION N/A 8/41/3244   Procedure: CO2 LASER APPLICATION,PENIS, GROIN, ANUS;  Surgeon: [name], MD;  Location: [place];  Service: General;  Laterality: N/A;  . CO2 LASER APPLICATION N/A 08/13/7251   Procedure: CO2 LASER APPLICATION;  Surgeon: Kathie Rhodes, MD;  Location: [facility];  Service: Urology;  Laterality: N/A;  . CONDYLOMA EXCISION/FULGURATION N/A 05/21/2015   Procedure: CONDYLOMA REMOVAL;  Surgeon: Kathie Rhodes, MD;  Location: [facility];  Service: Urology;  Laterality: N/A;  . HEMORRHOID SURGERY  10/24/2014   Procedure: HEMORRHOIDECTOMY;  Surgeon: Michael Boston, MD;  Location: [facility];  Service: General;;  . INCISION AND DRAINAGE ABSCESS Left 10/24/2014   Procedure: INCISION AND DRAINAGE ABSCESS;  Surgeon: Michael Boston, MD;  Location: [place];  Service: General;  Laterality: Left;  . INGUINAL HIDRADENITIS EXCISION  1998, 1999  . LASER ABLATION CONDOLAMATA N/A 03/20/2014   Procedure: EXAM UNDER ANESTHESIA, REMOVAL/ABLATION OF CONDYLOMATA PENIS,GROINS, ANUS, ANAL CANAL;  Surgeon: [name], MD;  Location: [place];  Service: General;  Laterality: N/A;  groin and anus  . LASER ABLATION CONDOLAMATA N/A 10/24/2014   Procedure: LASER ABLATION CONDOLAMATA;  Surgeon: Michael Boston, MD;  Location: [place];  Service: General;  Laterality: N/A;  . LASER ABLATION OF PENILE AND PERIANAL WARTS  07-29-2007  Dr. Johney Maine  . LEFT SHOULDER SURGERY  2003  . MASS EXCISION N/A 10/24/2014   Procedure: EXCISION OF PERINEAL MASS/SINUS;  Surgeon: Michael Boston, MD;  Location: [place];  Service: General;  Laterality: N/A;  . [name]  . TRANSURETHRAL RESECTION OF BLADDER TUMOR  05/21/2012   Procedure: TRANSURETHRAL RESECTION OF BLADDER TUMOR (TURBT);  Surgeon:  Claybon Jabs, MD;  Location: [facility];  Service: Urology;  Laterality: N/A;       Social History   Social History  . Marital status: Married    Spouse name: N/A  . Number of children: N/A  . Years of education: N/A   Occupational History  . Disabled    Social History Main Topics  . Smoking status: Former Smoker    Packs/day: 1.00    Years: 41.00    Types: Cigars, Cigarettes  . Smokeless tobacco: Never Used  . Alcohol use No  . Drug use: No  . Sexual activity: Not on file   Other Topics Concern  . Not on file   Social History Narrative  . No narrative on file    Current Outpatient Prescriptions on File Prior to Visit  Medication Sig Dispense Refill  . aspirin EC 81 MG tablet Take 1 tablet (81 mg total) by mouth daily.    Marland Kitchen atorvastatin (LIPITOR) 20 MG tablet TAKE ONE TABLET BY MOUTH ONCE DAILY 90 tablet 1  . losartan-hydrochlorothiazide (HYZAAR) 100-12.5 MG tablet TAKE ONE TABLET BY MOUTH ONCE DAILY 90 tablet 1   No current facility-administered medications on file prior to visit.     No Known Allergies  Family History  Problem Relation Age of Onset  . Hypertension Mother   . Cancer Father        lung ca  . Diabetes Maternal Aunt        x 2  . Colon cancer Neg Hx   . Esophageal cancer Neg Hx   . Pancreatic cancer Neg Hx   . Prostate cancer Neg Hx   . Kidney disease Neg Hx   . Liver disease Neg Hx   . Lung cancer Neg Hx     BP (!) 158/92   Pulse 66   Wt 230 lb 3.2 oz (104.4 kg)   SpO2 96%   BMI 35.00 kg/m    Review of Systems He denies hypoglycemia.  He has gained weight.      Objective:   Physical Exam VITAL SIGNS:  See vs page GENERAL: no distress Pulses: foot pulses are intact bilaterally.   MSK: no deformity of the feet or ankles.  CV: trace bilat leg edema, and bilat varicosities. Skin:  no ulcer on the feet or ankles.  normal color and temp on the feet and ankles.   Neuro: sensation is intact to touch on the feet  and ankles.   Ext: There is bilateral onychomycosis of the toenails.   A1c=10.4%    Assessment & Plan:  Insulin-requiring type 2 DM, with PAD: worse.  Obesity: worse: this exacerbates glycemic control.   Chronic pain syndrome: stable. therapy is limited by perceived drug intolerance.  HTN: recheck next time.   Patient Instructions  Please come back for a follow-up appointment in 2 months.   Please increase the insulin to 120 units each morning, and 40 units each evening.  I have sent a prescription to your pharmacy check your blood sugar twice a day.  vary the time of day when you check, between before the 3 meals, and at bedtime.  also check if you have symptoms of your blood sugar being too high or too low.  please keep a record of the readings and bring it to your next appointment here (or you can bring the meter itself).  You can write it on any piece of paper.  please call us sooner if your blood sugar goes below 70, or if you have a lot of readings over 200. Here is a refill of the hydrocodone.

## 2017-04-19 ENCOUNTER — Other Ambulatory Visit: Payer: Self-pay | Admitting: Endocrinology

## 2017-04-27 DIAGNOSIS — R1031 Right lower quadrant pain: Secondary | ICD-10-CM | POA: Diagnosis not present

## 2017-04-27 DIAGNOSIS — L732 Hidradenitis suppurativa: Secondary | ICD-10-CM | POA: Diagnosis not present

## 2017-04-27 DIAGNOSIS — I739 Peripheral vascular disease, unspecified: Secondary | ICD-10-CM | POA: Diagnosis not present

## 2017-04-29 ENCOUNTER — Other Ambulatory Visit: Payer: Self-pay | Admitting: Surgery

## 2017-04-29 DIAGNOSIS — R1031 Right lower quadrant pain: Secondary | ICD-10-CM

## 2017-05-01 ENCOUNTER — Ambulatory Visit
Admission: RE | Admit: 2017-05-01 | Discharge: 2017-05-01 | Disposition: A | Discharge status: Home / Self Care | Payer: Medicare Other | Source: Ambulatory Visit | Attending: Surgery | Admitting: Surgery

## 2017-05-01 DIAGNOSIS — R1031 Right lower quadrant pain: Secondary | ICD-10-CM

## 2017-05-01 DIAGNOSIS — K5730 Diverticulosis of large intestine without perforation or abscess without bleeding: Secondary | ICD-10-CM | POA: Diagnosis not present

## 2017-05-01 MED ORDER — IOPAMIDOL (ISOVUE-300) INJECTION 61%
125.0000 mL | Freq: Once | INTRAVENOUS | Status: AC | PRN
  Administered 2017-05-01: 125 mL via INTRAVENOUS

## 2017-05-11 ENCOUNTER — Other Ambulatory Visit: Payer: Self-pay

## 2017-05-11 ENCOUNTER — Telehealth: Payer: Self-pay | Admitting: Endocrinology

## 2017-05-11 MED ORDER — HYDROCODONE-ACETAMINOPHEN 5-325 MG PO TABS: 1.0000 | ORAL_TABLET | Freq: Four times a day (QID) | ORAL | 0 refills | Status: DC | PRN

## 2017-05-11 NOTE — Telephone Encounter (Signed)
MEDICATION: HYDROcodone-acetaminophen (NORCO/VICODIN) 5-325 MG tablet  PHARMACY:   [facility], [place] [phone] (Phone) [phone] (Fax)     IS THIS A 90 DAY SUPPLY : no  IS PATIENT OUT OF MEDICATION: no  IF NOT; HOW MUCH IS LEFT: 1 day  LAST APPOINTMENT DATE: @08 /27/18 NEXT APPOINTMENT DATE:@10 /29/2018  OTHER COMMENTS:    **Let patient know to contact pharmacy at the end of the day to make sure medication is ready. **  ** Please notify patient to allow 48-72 hours to process**  **Encourage patient to contact the pharmacy for refills or they can request refills through [facility]**

## 2017-05-11 NOTE — Telephone Encounter (Signed)
I printed  

## 2017-05-12 NOTE — Telephone Encounter (Signed)
Called patient & left VM that script was ready to be picked up at front desk.

## 2017-05-14 DIAGNOSIS — Z8551 Personal history of malignant neoplasm of bladder: Secondary | ICD-10-CM | POA: Diagnosis not present

## 2017-05-14 DIAGNOSIS — Z125 Encounter for screening for malignant neoplasm of prostate: Secondary | ICD-10-CM | POA: Diagnosis not present

## 2017-05-18 ENCOUNTER — Encounter: Payer: Self-pay | Admitting: Physician Assistant

## 2017-05-18 ENCOUNTER — Ambulatory Visit (INDEPENDENT_AMBULATORY_CARE_PROVIDER_SITE_OTHER): Payer: Medicare Other | Admitting: Physician Assistant

## 2017-05-18 VITALS — BP 122/80 | HR 82 | Ht 68.0 in | Wt 221.0 lb

## 2017-05-18 DIAGNOSIS — K219 Gastro-esophageal reflux disease without esophagitis: Secondary | ICD-10-CM | POA: Diagnosis not present

## 2017-05-18 DIAGNOSIS — Z86010 Personal history of colonic polyps: Secondary | ICD-10-CM

## 2017-05-18 MED ORDER — RANITIDINE HCL 150 MG PO TABS
150.0000 mg | ORAL_TABLET | Freq: Every day | ORAL | 11 refills | Status: DC
Start: 2017-05-18 — End: 2018-02-25

## 2017-05-18 NOTE — Patient Instructions (Addendum)
We have sent the following medications to your pharmacy for you to pick up at your convenience: [place], [place].  Call  us back in 2 months if your symptoms are not better.   Your next colonoscopy will be with Dr. Scarlette Shorts, April 2019.  You will get a letter from on in March of 2019.

## 2017-05-18 NOTE — Progress Notes (Signed)
Physician assistant assessment and plans reviewed 

## 2017-05-18 NOTE — Progress Notes (Signed)
Subjective:    Patient ID: [name], male    DOB: 13-Apr-1956, 61 y.o.   MRN: [id number]  HPI [name] is a 61 year old white male, known previously to Dr. Deatra Ina, referred back today by Dr. Sherrie Mustache for evaluation of GERD, and patient apparently had also had recent complaints of right lower quadrant pain. Patient has history of bladder cancer, adult-onset diabetes mellitus insulin-dependent, hyperlipidemia, hypertension, peripheral arterial disease. He has history of multiple adenomatous colon polyps with last colonoscopy done in April 2016. He had several polyps removed, 2 of these were 15 mm and one was 20 mm in size. Path on all of the polyps consistent with tubular adenomas. He is indicated for 3 year interval follow-up. He has not had prior EGD. Patient comes in complaining of heartburn and indigestion over the past couple of months. He says this is coming and going. He mentions that he had a CT done of his abdomen a couple of weeks ago and that the contrast caused him to have severe heartburn. His symptoms are generally worse at night after he goes to bed. He says he saw a very unusual schedule and often doesn't eat dinner until 10:30 at night and then goes to bed around 1. During the daytime is having frequent burping and belching. He denies any dysphagia or odynophagia. His heartburn seems to be worse at night if he lies on his right side. He mentions something about having a sense of shortness of breath that comes from his throat and not from his lungs.  Patient did not speak at all today about any abdominal pain or right-sided abdominal pain. He was very rambling and talking about all of his other medical problems, developing bladder cancer as a result of taking Actos and being involved in a class action lawsuit. He says he doesn't really want to take any medication for reflux, he's been using Tums and Gas-X as needed. He says he doesn't like taking medications and doesn't want to  have to get on another regimen. CT of the abdomen and pelvis was done on 05/01/2017 showing diffuse fatty infiltration of the liver no gallstones, the previously noted lesion in the right lateral wall of the bladder was no longer identified no new enhancing abnormality seen, just diverticulosis without diverticulitis, appendix appears normal no obstructive or inflammatory changes of the bowel are seen in no abdominal wall hernia or abnormality.  Review of Systems Pertinent positive and negative review of systems were noted in the above HPI section.  All other review of systems was otherwise negative.  Outpatient Encounter Prescriptions as of 05/18/2017  Medication Sig  . aspirin EC 81 MG tablet Take 1 tablet (81 mg total) by mouth daily.  Marland Kitchen atorvastatin (LIPITOR) 20 MG tablet TAKE 1 TABLET BY MOUTH ONCE DAILY  . HYDROcodone-acetaminophen (NORCO/VICODIN) 5-325 MG tablet Take 1 tablet by mouth every 6 (six) hours as needed for moderate pain.  Marland Kitchen insulin NPH Human (NOVOLIN N) 100 UNIT/ML injection 120 units each morning, and 40 units each evening  . losartan-hydrochlorothiazide (HYZAAR) 100-12.5 MG tablet TAKE ONE TABLET BY MOUTH ONCE DAILY  . ranitidine (ZANTAC) 150 MG tablet Take 1 tablet (150 mg total) by mouth at bedtime.   No facility-administered encounter medications on file as of 05/18/2017.    No Known Allergies Patient Active Problem List   Diagnosis Date Noted  . Laceration of skin of right lower leg 06/11/2016  . Diabetes ([place]) 10/02/2015  . Back pain 04/02/2015  .  Dyslipidemia 01/01/2015  . Wheezing 01/01/2015  . Atypical chest pain 07/18/2014  . Wellness examination 07/03/2014  . PAD (peripheral artery disease) ([place]) 04/05/2014  . Folliculitis of scrotum 04/03/2014  . Pain in joint, lower leg 04/03/2014  . Edema of posterior shaft of penis 01/31/2014  . Bladder cancer 05/21/2012  . Gross hematuria 03/25/2012  . Encounter for long-term (current) use of other medications  02/02/2012  . Screening for prostate cancer 02/02/2012  . Hepatomegaly 09/10/2010  . ABNORMAL ELECTROCARDIOGRAM 09/10/2010  . FOOT PAIN, LEFT 05/02/2010  . ASTHMA 07/25/2009  . Tobacco abuse 05/22/2009  . Cough 05/11/2008  . Condylomata of penis, groins, perianal - recurrent 01/17/2008  . SHOULDER PAIN, LEFT, CHRONIC 01/17/2008  . HYPERCHOLESTEROLEMIA 10/06/2007  . Essential hypertension 03/06/2007  . ALLERGIC RHINITIS 03/06/2007   Social History   Social History  . Marital status: Married    Spouse name: N/A  . Number of children: N/A  . Years of education: N/A   Occupational History  . Disabled    Social History Main Topics  . Smoking status: Former Smoker    Packs/day: 1.00    Years: 41.00    Types: Cigars, Cigarettes  . Smokeless tobacco: Never Used  . Alcohol use No  . Drug use: No  . Sexual activity: Not on file   Other Topics Concern  . Not on file   Social History Narrative  . No narrative on file    Mr. Romanoski family history includes Cancer in his father; Diabetes in his maternal aunt; Hypertension in his mother.      Objective:    Vitals:   05/18/17 1056  BP: 122/80  Pulse: 82    Physical Exam  well-developed older white male in no acute distress, blood pressure 122/80 pulse 82 height 5 foot 8, weight 221, BMI 33.6. HEENT; nontraumatic normocephalic EOMI PERRLA sclera anicteric, Cardiovascular; regular rate and rhythm with S1-S2 no murmur or gallop, Pulmonary; clear bilaterally, Abdomen ;is, soft nontender nondistended bowel sounds are active there is no palpable mass or hepatosplenomegaly, Rectal; exam not done, Extremities; no clubbing cyanosis or edema skin warm and dry, Neuropsych; affect appropriate, rambling tangential historian       Assessment & Plan:   #54 61 year old white male with new complaint of GERD 2 months with daytime and nighttime symptoms, worse at night. #2 history of multiple adenomatous colon polyps-last colonoscopy April  2016, due for follow-up April 2019 #3 diverticulosis #4 recent complaint of right-sided abdominal pain per Dr. Britt Bolognese had no concerns about right-sided abdominal pain today. #5 history of bladder CA #6 insulin-dependent diabetes mellitus #7 hyperlipidemia #8 peripheral arterial disease #9 hypertension #10 difficult historian  Plan; Start strict antireflux regimen, this was reviewed with the patient including elevation of the head of the bed 45 and nothing by mouth for 3 hours prior to bedtime Was provided with the diet and educational materials Will start ranitidine 150 mg by mouth daily at bedtime Patient is asked to call back if this is not helpful controlling his symptoms., At that point would start a PPI. We discussed potential EGD for surveillance which she wouldn't does not want to do at this time but would be agreeable at time of follow-up colonoscopy in April 2019 Patient will be established with Dr. Scarlette Shorts Patient will follow-up with me in the office on an as-needed basis.  Amy S Esterwood PA-C 05/18/2017   Cc: Michael Boston, MD

## 2017-06-08 ENCOUNTER — Ambulatory Visit (INDEPENDENT_AMBULATORY_CARE_PROVIDER_SITE_OTHER): Payer: Medicare Other | Admitting: Endocrinology

## 2017-06-08 ENCOUNTER — Encounter: Payer: Self-pay | Admitting: Endocrinology

## 2017-06-08 VITALS — BP 182/82 | HR 65 | Wt 239.6 lb

## 2017-06-08 DIAGNOSIS — E1151 Type 2 diabetes mellitus with diabetic peripheral angiopathy without gangrene: Secondary | ICD-10-CM | POA: Diagnosis not present

## 2017-06-08 DIAGNOSIS — M25512 Pain in left shoulder: Secondary | ICD-10-CM

## 2017-06-08 DIAGNOSIS — Z794 Long term (current) use of insulin: Secondary | ICD-10-CM | POA: Diagnosis not present

## 2017-06-08 LAB — POCT GLYCOSYLATED HEMOGLOBIN (HGB A1C): Hemoglobin A1C: 9.2

## 2017-06-08 MED ORDER — LOSARTAN POTASSIUM-HCTZ 100-25 MG PO TABS: 1.0000 | ORAL_TABLET | Freq: Every day | ORAL | 3 refills | Status: DC

## 2017-06-08 MED ORDER — INSULIN NPH (HUMAN) (ISOPHANE) 100 UNIT/ML ~~LOC~~ SUSP: SUBCUTANEOUS | 3 refills | Status: DC

## 2017-06-08 MED ORDER — LIDOCAINE 5 % EX PTCH: 1.0000 | MEDICATED_PATCH | CUTANEOUS | 11 refills | Status: DC

## 2017-06-08 NOTE — Patient Instructions (Addendum)
Please come back for a follow-up appointment in 3 months.   Please increase the insulin to 150 units each morning, and 30 units each evening.  I have sent a prescription to your pharmacy.   check your blood sugar twice a day.  vary the time of day when you check, between before the 3 meals, and at bedtime.  also check if you have symptoms of your blood sugar being too high or too low.  please keep a record of the readings and bring it to your next appointment here (or you can bring the meter itself).  You can write it on any piece of paper.  please call us sooner if your blood sugar goes below 70, or if you have a lot of readings over 200. I have sent a prescription to your pharmacy, to increase the losartan-HCTZ.   I have also sent a prescription to your pharmacy, for the pain patch.

## 2017-06-08 NOTE — Progress Notes (Signed)
Subjective:    Patient ID: [name], male    DOB: January 22, 1956, 61 y.o.   MRN: [id number]  HPI Pt returns for f/u of diabetes mellitus: DM type: Insulin-requiring type 2 Dx'ed: 7902 Complications: PAD and polyneuropathy.  Therapy: insulin since 2009 DKA: never Severe hypoglycemia: never Pancreatitis: never Other: he takes a QD insulin regimen, after poor results with multiple daily injections, and then also with BID insulin.  Interval history: he says he never misses the insulin.  Meter is downloaded today, and the printout is scanned into the record.  It varies from 90-300's.  It is in general higher as the day goes on.   He takes 120 units qam and 40 units qpm.   Left shoulder and hip pain, since injury in 2004.  He says orthopedic surg told him there was nothing more they could do for him.  He says PT made pain worse.  Gabapentin caused heartburn (he now sees GI for GERD).  He can't afford Lyrica.  He can't take NSAID, due to GI probs.  He says he was called by "[facility] for pain," but he declined.     Past Medical History:  Diagnosis Date  . Allergic rhinitis   . Arthritis   . At risk for sleep apnea    STOP-BANG= 5   SENT TO PCP 03-14-2014  . Condyloma acuminatum of penis   . Diabetic neuropathy ([place])   . GERD (gastroesophageal reflux disease)   . History of bladder cancer    s/p  turbt  2013/   transitional cell carcinoma--   . History of condyloma acuminatum    PERINEAL AREA  W/ RECURRENCY  . History of gout   . Hyperlipidemia   . Hypertension   . Lower urinary tract symptoms (LUTS)   . Productive cough   . PVD (peripheral vascular disease) with claudication (HCC)    bilateral SFA disease-- right > left  and left tibial artery disease--  per duplex  . Smokers' cough ([place])   . Type 2 diabetes mellitus with insulin therapy ([place])    monitor by  dr ellsion  . Wears dentures    upper    Past Surgical History:  Procedure Laterality Date  . AXILLARY  HIDRADENITIS EXCISION  1997  . CARDIOVASCULAR STRESS TEST  07-24-2014  dr Kathlyn [place]   Low risk lexiscan nuclear study with apical thinning and small inferolateral wall infarct at mid & basal level , no ischemia/  normal LVF and wall motion , ef 59%  . CATARACT EXTRACTION W/ INTRAOCULAR LENS IMPLANT Right   . CO2 LASER APPLICATION N/A 11/18/7351   Procedure: CO2 LASER APPLICATION,PENIS, GROIN, ANUS;  Surgeon: [name], MD;  Location: [place];  Service: General;  Laterality: N/A;  . CO2 LASER APPLICATION N/A 29/92/4268   Procedure: CO2 LASER APPLICATION;  Surgeon: Kathie Rhodes, MD;  Location: [facility];  Service: Urology;  Laterality: N/A;  . CONDYLOMA EXCISION/FULGURATION N/A 05/21/2015   Procedure: CONDYLOMA REMOVAL;  Surgeon: Kathie Rhodes, MD;  Location[facility];  Service: Urology;  Laterality: N/A;  . HEMORRHOID SURGERY  10/24/2014   Procedure: HEMORRHOIDECTOMY;  Surgeon: Michael Boston, MD;  Location: [facility];  Service: General;;  . INCISION AND DRAINAGE ABSCESS Left 10/24/2014   Procedure: INCISION AND DRAINAGE ABSCESS;  Surgeon: Michael Boston, MD;  Location: [place];  Service: General;  Laterality: Left;  . INGUINAL HIDRADENITIS EXCISION  1998, 1999  .  LASER ABLATION CONDOLAMATA N/A 03/20/2014   Procedure: EXAM UNDER ANESTHESIA, REMOVAL/ABLATION OF CONDYLOMATA PENIS,GROINS, ANUS, ANAL CANAL;  Surgeon: [name], MD;  Location: [place];  Service: General;  Laterality: N/A;  groin and anus  . LASER ABLATION CONDOLAMATA N/A 10/24/2014   Procedure: LASER ABLATION CONDOLAMATA;  Surgeon: Michael Boston, MD;  Location: [facility];  Service: General;  Laterality: N/A;  . LASER ABLATION OF PENILE AND PERIANAL WARTS  07-29-2007  Dr. Johney Maine  . LEFT SHOULDER SURGERY  2003  . MASS EXCISION N/A 10/24/2014   Procedure: EXCISION OF PERINEAL MASS/SINUS;  Surgeon: Michael Boston, MD;  Location: [place];  Service: General;  Laterality: N/A;  . [place]  . TRANSURETHRAL RESECTION OF BLADDER TUMOR  05/21/2012   Procedure: TRANSURETHRAL RESECTION OF BLADDER TUMOR (TURBT);  Surgeon: Claybon Jabs, MD;  Location: [facility];  Service: Urology;  Laterality: N/A;       Social History   Social History  . Marital status: Married    Spouse name: N/A  . Number of children: N/A  . Years of education: N/A   Occupational History  . Disabled    Social History Main Topics  . Smoking status: Former Smoker    Packs/day: 1.00    Years: 41.00    Types: Cigars, Cigarettes  . Smokeless tobacco: Never Used  . Alcohol use No  . Drug use: No  . Sexual activity: Not on file   Other Topics Concern  . Not on file   Social History Narrative  . No narrative on file    Current Outpatient Prescriptions on File Prior to Visit  Medication Sig Dispense Refill  . aspirin EC 81 MG tablet Take 1 tablet (81 mg total) by mouth daily.    Marland Kitchen atorvastatin (LIPITOR) 20 MG tablet TAKE 1 TABLET BY MOUTH ONCE DAILY 90 tablet 1  . ranitidine (ZANTAC) 150 MG tablet Take 1 tablet (150 mg total) by mouth at bedtime. 30 tablet 11   No current facility-administered medications on file prior to visit.     No Known Allergies  Family History  Problem Relation Age of Onset  . Hypertension Mother   . Cancer Father        lung ca  . Diabetes Maternal Aunt        x 2  . Colon cancer Neg Hx   . Esophageal cancer Neg Hx   . Pancreatic cancer Neg Hx   . Prostate cancer Neg Hx   . Kidney disease Neg Hx   . Liver disease Neg Hx   . Lung cancer Neg Hx     BP (!) 182/82   Pulse 65   Wt 239 lb 9.6 oz (108.7 kg)   SpO2 95%   BMI 36.43 kg/m   Review of Systems He has gained weight.      Objective:   Physical Exam VITAL SIGNS:  See vs page GENERAL: no distress Pulses: foot pulses are intact bilaterally.   MSK:  no deformity of the feet or ankles.  CV: trace bilat leg edema, and bilat varicosities. Skin:  no ulcer on the feet or ankles, but the skin is dry.  normal color and temp on the feet and ankles.   Neuro: sensation is intact to touch on the feet and ankles.   Ext: There is bilateral onychomycosis of the toenails.    Lab Results  Component Value Date  HGBA1C 9.2 06/08/2017   Lab Results  Component Value Date   CREATININE 1.10 01/08/2017   BUN 14 01/08/2017   NA 137 01/08/2017   K 3.9 01/08/2017   CL 99 01/08/2017   CO2 30 01/08/2017      Assessment & Plan:  Insulin-requiring type 2 DM: he needs increased rx.  Chronic pain syndrome: we discussed rx options.   HTN: he needs increased rx.   Patient Instructions  Please come back for a follow-up appointment in 3 months.   Please increase the insulin to 150 units each morning, and 30 units each evening.  I have sent a prescription to your pharmacy.   check your blood sugar twice a day.  vary the time of day when you check, between before the 3 meals, and at bedtime.  also check if you have symptoms of your blood sugar being too high or too low.  please keep a record of the readings and bring it to your next appointment here (or you can bring the meter itself).  You can write it on any piece of paper.  please call us sooner if your blood sugar goes below 70, or if you have a lot of readings over 200. I have sent a prescription to your pharmacy, to increase the losartan-HCTZ.   I have also sent a prescription to your pharmacy, for the pain patch.

## 2017-07-13 ENCOUNTER — Ambulatory Visit (INDEPENDENT_AMBULATORY_CARE_PROVIDER_SITE_OTHER): Payer: Medicare Other | Admitting: Endocrinology

## 2017-07-13 ENCOUNTER — Encounter: Payer: Self-pay | Admitting: Endocrinology

## 2017-07-13 VITALS — BP 174/102 | HR 79 | Temp 97.7°F | Wt 239.8 lb

## 2017-07-13 DIAGNOSIS — M25512 Pain in left shoulder: Secondary | ICD-10-CM | POA: Diagnosis not present

## 2017-07-13 MED ORDER — CEFUROXIME AXETIL 250 MG PO TABS: 250.0000 mg | ORAL_TABLET | Freq: Two times a day (BID) | ORAL | 0 refills | Status: AC

## 2017-07-13 MED ORDER — PROMETHAZINE-DM 6.25-15 MG/5ML PO SYRP: 5.0000 mL | ORAL_SOLUTION | Freq: Four times a day (QID) | ORAL | 0 refills | Status: DC | PRN

## 2017-07-13 NOTE — Patient Instructions (Addendum)
I have sent a prescription to your pharmacy, for an antibiotic pill, and for cough syrup.   Loratadine-d (non-prescription) will help your congestion.  Please continue the same medication for blood pressure, for now.  We'll hold off and medication for the shoulder pain.   I'll see you next time.

## 2017-07-13 NOTE — Progress Notes (Signed)
Subjective:    Patient ID: [name], male    DOB: 04-23-1956, 61 y.o.   MRN: [id number]  HPI Pt states 1 week of moderate pain at the throat, and assoc nasal congestion.  He also has prod cough.   He did not fill the lidocaine patch, due to cost.  He declines any further rx for the shoulder pain.   Past Medical History:  Diagnosis Date  . Allergic rhinitis   . Arthritis   . At risk for sleep apnea    STOP-BANG= 5   SENT TO PCP 03-14-2014  . Condyloma acuminatum of penis   . Diabetic neuropathy ([place])   . GERD (gastroesophageal reflux disease)   . History of bladder cancer    s/p  turbt  2013/   transitional cell carcinoma--   . History of condyloma acuminatum    PERINEAL AREA  W/ RECURRENCY  . History of gout   . Hyperlipidemia   . Hypertension   . Lower urinary tract symptoms (LUTS)   . Productive cough   . PVD (peripheral vascular disease) with claudication (HCC)    bilateral SFA disease-- right > left  and left tibial artery disease--  per duplex  . Smokers' cough ([place])   . Type 2 diabetes mellitus with insulin therapy ([place])    monitor by  dr ellsion  . Wears dentures    upper    Past Surgical History:  Procedure Laterality Date  . AXILLARY HIDRADENITIS EXCISION  1997  . CARDIOVASCULAR STRESS TEST  07-24-2014  dr Kathlyn [place]   Low risk lexiscan nuclear study with apical thinning and small inferolateral wall infarct at mid & basal level , no ischemia/  normal LVF and wall motion , ef 59%  . CATARACT EXTRACTION W/ INTRAOCULAR LENS IMPLANT Right   . CO2 LASER APPLICATION N/A 3/47/4259   Procedure: CO2 LASER APPLICATION,PENIS, GROIN, ANUS;  Surgeon: [name], MD;  Location: [place];  Service: General;  Laterality: N/A;  . CO2 LASER APPLICATION N/A 56/38/7564   Procedure: CO2 LASER APPLICATION;  Surgeon: Kathie Rhodes, MD;  Location[facility];  Service: Urology;  Laterality: N/A;  . CONDYLOMA EXCISION/FULGURATION N/A  05/21/2015   Procedure: CONDYLOMA REMOVAL;  Surgeon: Kathie Rhodes, MD;  Location: [facility];  Service: Urology;  Laterality: N/A;  . HEMORRHOID SURGERY  10/24/2014   Procedure: HEMORRHOIDECTOMY;  Surgeon: Michael Boston, MD;  Location: [facility];  Service: General;;  . INCISION AND DRAINAGE ABSCESS Left 10/24/2014   Procedure: INCISION AND DRAINAGE ABSCESS;  Surgeon: Michael Boston, MD;  Location: [place];  Service: General;  Laterality: Left;  . INGUINAL HIDRADENITIS EXCISION  1998, 1999  . LASER ABLATION CONDOLAMATA N/A 03/20/2014   Procedure: EXAM UNDER ANESTHESIA, REMOVAL/ABLATION OF CONDYLOMATA PENIS,GROINS, ANUS, ANAL CANAL;  Surgeon: [name], MD;  Location: [place];  Service: General;  Laterality: N/A;  groin and anus  . LASER ABLATION CONDOLAMATA N/A 10/24/2014   Procedure: LASER ABLATION CONDOLAMATA;  Surgeon: Michael Boston, MD;  Location[facility];  Service: General;  Laterality: N/A;  . LASER ABLATION OF PENILE AND PERIANAL WARTS  07-29-2007  Dr. Johney Maine  . LEFT SHOULDER SURGERY  2003  . MASS EXCISION N/A 10/24/2014   Procedure: EXCISION OF PERINEAL MASS/SINUS;  Surgeon: Michael Boston, MD;  Location: [place];  Service: General;  Laterality: N/A;  . [place]  .  TRANSURETHRAL RESECTION OF BLADDER TUMOR  05/21/2012   Procedure: TRANSURETHRAL RESECTION OF BLADDER TUMOR (TURBT);  Surgeon: Claybon Jabs, MD;  Location: [facility];  Service: Urology;  Laterality: N/A;       Social History   Socioeconomic History  . Marital status: Married    Spouse name: Not on file  . Number of children: Not on file  . Years of education: Not on file  . Highest education level: Not on file  Social Needs  . Financial resource strain: Not on file  . Food insecurity - worry: Not on file  . Food insecurity - inability: Not on file  .  Transportation needs - medical: Not on file  . Transportation needs - non-medical: Not on file  Occupational History  . Occupation: Disabled  Tobacco Use  . Smoking status: Former Smoker    Packs/day: 1.00    Years: 41.00    Pack years: 41.00    Types: Cigars, Cigarettes  . Smokeless tobacco: Never Used  Substance and Sexual Activity  . Alcohol use: No    Alcohol/week: 0.0 oz  . Drug use: No  . Sexual activity: Not on file  Other Topics Concern  . Not on file  Social History Narrative  . Not on file    Current Outpatient Medications on File Prior to Visit  Medication Sig Dispense Refill  . aspirin EC 81 MG tablet Take 1 tablet (81 mg total) by mouth daily.    Marland Kitchen atorvastatin (LIPITOR) 20 MG tablet TAKE 1 TABLET BY MOUTH ONCE DAILY 90 tablet 1  . insulin NPH Human (NOVOLIN N) 100 UNIT/ML injection 150 units each morning, and 30 units each evening, and syringes 1/day. 60 mL 3  . lidocaine (LIDODERM) 5 % Place 1 patch onto the skin daily. 30 patch 11  . losartan-hydrochlorothiazide (HYZAAR) 100-25 MG tablet Take 1 tablet by mouth daily. 90 tablet 3  . ranitidine (ZANTAC) 150 MG tablet Take 1 tablet (150 mg total) by mouth at bedtime. 30 tablet 11   No current facility-administered medications on file prior to visit.     No Known Allergies  Family History  Problem Relation Age of Onset  . Hypertension Mother   . Cancer Father        lung ca  . Diabetes Maternal Aunt        x 2  . Colon cancer Neg Hx   . Esophageal cancer Neg Hx   . Pancreatic cancer Neg Hx   . Prostate cancer Neg Hx   . Kidney disease Neg Hx   . Liver disease Neg Hx   . Lung cancer Neg Hx     BP (!) 174/102 (BP Location: Right Arm, Patient Position: Sitting, Cuff Size: Normal)   Pulse 79   Temp 97.7 F (36.5 C)   Wt 239 lb 12.8 oz (108.8 kg)   SpO2 92%   BMI 36.46 kg/m   Review of Systems Denies fever, but he has right otalgia.      Objective:   Physical Exam VITAL SIGNS:  See vs  page GENERAL: no distress head: no deformity  eyes: no periorbital swelling, no proptosis  external nose and ears are normal  mouth: no lesion seen Both tm's are slightly red.  LUNGS:  Clear to auscultation.     Assessment & Plan:  URI: new HTN: with prob situational component Shoulder pain, stable.   Patient Instructions  I have sent a prescription to your pharmacy, for an  antibiotic pill, and for cough syrup.   Loratadine-d (non-prescription) will help your congestion.  Please continue the same medication for blood pressure, for now.  We'll hold off and medication for the shoulder pain.   I'll see you next time.

## 2017-09-10 ENCOUNTER — Encounter: Payer: Self-pay | Admitting: Endocrinology

## 2017-09-10 ENCOUNTER — Ambulatory Visit (INDEPENDENT_AMBULATORY_CARE_PROVIDER_SITE_OTHER): Payer: Medicare Other | Admitting: Endocrinology

## 2017-09-10 VITALS — BP 162/84 | HR 68 | Wt 245.6 lb

## 2017-09-10 DIAGNOSIS — Z794 Long term (current) use of insulin: Secondary | ICD-10-CM | POA: Diagnosis not present

## 2017-09-10 DIAGNOSIS — Z0001 Encounter for general adult medical examination with abnormal findings: Secondary | ICD-10-CM

## 2017-09-10 DIAGNOSIS — E1151 Type 2 diabetes mellitus with diabetic peripheral angiopathy without gangrene: Secondary | ICD-10-CM

## 2017-09-10 DIAGNOSIS — I10 Essential (primary) hypertension: Secondary | ICD-10-CM | POA: Diagnosis not present

## 2017-09-10 LAB — POCT GLYCOSYLATED HEMOGLOBIN (HGB A1C): HEMOGLOBIN A1C: 9.4

## 2017-09-10 MED ORDER — AMLODIPINE BESYLATE 2.5 MG PO TABS: 2.5000 mg | ORAL_TABLET | Freq: Every day | ORAL | 3 refills | Status: DC

## 2017-09-10 MED ORDER — INSULIN NPH (HUMAN) (ISOPHANE) 100 UNIT/ML ~~LOC~~ SUSP: SUBCUTANEOUS | 3 refills | Status: DC

## 2017-09-10 NOTE — Patient Instructions (Addendum)
Please increase the insulin to 180 units each morning, and 20 units each evening.  I have sent a prescription to your pharmacy.   check your blood sugar twice a day.  vary the time of day when you check, between before the 3 meals, and at bedtime.  also check if you have symptoms of your blood sugar being too high or too low.  please keep a record of the readings and bring it to your next appointment here (or you can bring the meter itself).  You can write it on any piece of paper.  please call us sooner if your blood sugar goes below 70, or if you have a lot of readings over 200. I have sent a prescription to your pharmacy, to add amlodipine.  good diet and exercise significantly improve the control of your diabetes.  please let me know if you wish to be referred to a dietician.  high blood sugar is very risky to your health.  you should see an eye doctor and dentist every year.  It is very important to get all recommended vaccinations.  Please consider these measures for your health:  minimize alcohol.  Do not use tobacco products.  Have a colonoscopy at least every 10 years from age 42.  Keep firearms safely stored.  Always use seat belts.  have working smoke alarms in your home.  See an eye doctor and dentist regularly.  Never drive under the influence of alcohol or drugs (including prescription drugs).  Those with fair skin should take precautions against the sun, and should carefully examine their skin once per month, for any new or changed moles.  It is critically important to prevent falling down (keep floor areas well-lit, dry, and free of loose objects.  If you have a cane, walker, or wheelchair, you should use it, even for short trips around the house.  Wear flat-soled shoes.  Also, try not to rush).    Please come back for a follow-up appointment in 2 months.

## 2017-09-10 NOTE — Progress Notes (Signed)
Subjective:    Patient ID: [name], male    DOB: 08/21/1955, 62 y.o.   MRN: [id number]  HPI Pt is here for regular wellness examination, and is feeling pretty well in general, and says chronic med probs are stable, except as noted below Past Medical History:  Diagnosis Date  . Allergic rhinitis   . Arthritis   . At risk for sleep apnea    STOP-BANG= 5   SENT TO PCP 03-14-2014  . Condyloma acuminatum of penis   . Diabetic neuropathy ([place])   . GERD (gastroesophageal reflux disease)   . History of bladder cancer    s/p  turbt  2013/   transitional cell carcinoma--   . History of condyloma acuminatum    PERINEAL AREA  W/ RECURRENCY  . History of gout   . Hyperlipidemia   . Hypertension   . Lower urinary tract symptoms (LUTS)   . Productive cough   . PVD (peripheral vascular disease) with claudication (HCC)    bilateral SFA disease-- right > left  and left tibial artery disease--  per duplex  . Smokers' cough ([place])   . Type 2 diabetes mellitus with insulin therapy ([place])    monitor by  dr ellsion  . Wears dentures    upper    Past Surgical History:  Procedure Laterality Date  . AXILLARY HIDRADENITIS EXCISION  1997  . CARDIOVASCULAR STRESS TEST  07-24-2014  dr Kathlyn [place]   Low risk lexiscan nuclear study with apical thinning and small inferolateral wall infarct at mid & basal level , no ischemia/  normal LVF and wall motion , ef 59%  . CATARACT EXTRACTION W/ INTRAOCULAR LENS IMPLANT Right   . CO2 LASER APPLICATION N/A 1/44/3154   Procedure: CO2 LASER APPLICATION,PENIS, GROIN, ANUS;  Surgeon: [name], MD;  Location: [place];  Service: General;  Laterality: N/A;  . CO2 LASER APPLICATION N/A 00/86/7619   Procedure: CO2 LASER APPLICATION;  Surgeon: Kathie Rhodes, MD;  Location: [facility];  Service: Urology;  Laterality: N/A;  . CONDYLOMA EXCISION/FULGURATION N/A 05/21/2015   Procedure: CONDYLOMA REMOVAL;  Surgeon: Kathie Rhodes,  MD;  Location: [facility];  Service: Urology;  Laterality: N/A;  . HEMORRHOID SURGERY  10/24/2014   Procedure: HEMORRHOIDECTOMY;  Surgeon: Michael Boston, MD;  Location: [facility];  Service: General;;  . INCISION AND DRAINAGE ABSCESS Left 10/24/2014   Procedure: INCISION AND DRAINAGE ABSCESS;  Surgeon: Michael Boston, MD;  Location: [place];  Service: General;  Laterality: Left;  . INGUINAL HIDRADENITIS EXCISION  1998, 1999  . LASER ABLATION CONDOLAMATA N/A 03/20/2014   Procedure: EXAM UNDER ANESTHESIA, REMOVAL/ABLATION OF CONDYLOMATA PENIS,GROINS, ANUS, ANAL CANAL;  Surgeon: [name], MD;  Location: [place];  Service: General;  Laterality: N/A;  groin and anus  . LASER ABLATION CONDOLAMATA N/A 10/24/2014   Procedure: LASER ABLATION CONDOLAMATA;  Surgeon: Michael Boston, MD;  Location: [place];  Service: General;  Laterality: N/A;  . LASER ABLATION OF PENILE AND PERIANAL WARTS  07-29-2007  Dr. Johney Maine  . LEFT SHOULDER SURGERY  2003  . MASS EXCISION N/A 10/24/2014   Procedure: EXCISION OF PERINEAL MASS/SINUS;  Surgeon: Michael Boston, MD;  Location: [place];  Service: General;  Laterality: N/A;  . [place]  . TRANSURETHRAL RESECTION OF BLADDER TUMOR  05/21/2012   Procedure: TRANSURETHRAL RESECTION OF BLADDER TUMOR (TURBT);  Surgeon: Elta Guadeloupe  Nedra Hai, MD;  Location: [facility];  Service: Urology;  Laterality: N/A;       Social History   Socioeconomic History  . Marital status: Married    Spouse name: Not on file  . Number of children: Not on file  . Years of education: Not on file  . Highest education level: Not on file  Social Needs  . Financial resource strain: Not on file  . Food insecurity - worry: Not on file  . Food insecurity - inability: Not on file  . Transportation needs - medical: Not on file  . Transportation needs -  non-medical: Not on file  Occupational History  . Occupation: Disabled  Tobacco Use  . Smoking status: Former Smoker    Packs/day: 1.00    Years: 41.00    Pack years: 41.00    Types: Cigars, Cigarettes  . Smokeless tobacco: Never Used  Substance and Sexual Activity  . Alcohol use: No    Alcohol/week: 0.0 oz  . Drug use: No  . Sexual activity: Not on file  Other Topics Concern  . Not on file  Social History Narrative  . Not on file    Current Outpatient Medications on File Prior to Visit  Medication Sig Dispense Refill  . atorvastatin (LIPITOR) 20 MG tablet TAKE 1 TABLET BY MOUTH ONCE DAILY 90 tablet 1  . losartan-hydrochlorothiazide (HYZAAR) 100-25 MG tablet Take 1 tablet by mouth daily. 90 tablet 3  . aspirin EC 81 MG tablet Take 1 tablet (81 mg total) by mouth daily. (Patient not taking: Reported on 09/10/2017)    . lidocaine (LIDODERM) 5 % Place 1 patch onto the skin daily. (Patient not taking: Reported on 09/10/2017) 30 patch 11  . ranitidine (ZANTAC) 150 MG tablet Take 1 tablet (150 mg total) by mouth at bedtime. (Patient not taking: Reported on 09/10/2017) 30 tablet 11   No current facility-administered medications on file prior to visit.     No Known Allergies  Family History  Problem Relation Age of Onset  . Hypertension Mother   . Cancer Father        lung ca  . Diabetes Maternal Aunt        x 2  . Colon cancer Neg Hx   . Esophageal cancer Neg Hx   . Pancreatic cancer Neg Hx   . Prostate cancer Neg Hx   . Kidney disease Neg Hx   . Liver disease Neg Hx   . Lung cancer Neg Hx     BP (!) 162/84 (BP Location: Left Arm, Patient Position: Sitting, Cuff Size: Normal)   Pulse 68   Wt 245 lb 9.6 oz (111.4 kg)   SpO2 97%   BMI 37.34 kg/m     Review of Systems Denies fever, fatigue, diplopia, earache, chest pain, sob, back pain, anxiety, cold intolerance, BRBPR, hematuria, syncope, numbness, allergy sxs, easy bruising, and rash.  No change in chronic low-back  pain.      Objective:   Physical Exam VS: see vs page GEN: no distress HEAD: head: no deformity eyes: no periorbital swelling, no proptosis external nose and ears are normal mouth: no lesion seen NECK: supple, thyroid is not enlarged CHEST WALL: no deformity LUNGS: clear to auscultation BREASTS:  No gynecomastia CV: reg rate and rhythm, no murmur ABD: abdomen is soft, nontender.  no hepatosplenomegaly.  not distended.  no hernia.   RECTAL/PROSTATE: sees urology.   MUSCULOSKELETAL: muscle bulk and strength are grossly normal.  no  obvious joint swelling.  gait is normal and steady PULSES: no carotid bruit NEURO:  cn 2-12 grossly intact.   readily moves all 4's. SKIN:  Normal texture and temperature.  No rash or suspicious lesion is visible.   NODES:  None palpable at the neck PSYCH: alert, well-oriented.  Does not appear anxious nor depressed.       Assessment & Plan:  Wellness visit today, with problems stable, except as noted.   SEPARATE EVALUATION FOLLOWS--EACH PROBLEM HERE IS NEW, NOT RESPONDING TO TREATMENT, OR POSES SIGNIFICANT RISK TO THE PATIENT'S HEALTH: HISTORY OF THE PRESENT ILLNESS: Pt returns for f/u of diabetes mellitus: DM type: Insulin-requiring type 2.   Dx'ed: 5885 Complications: PAD and polyneuropathy.  Therapy: insulin since 2009 DKA: never Severe hypoglycemia: never.  Pancreatitis: never.  Other: he takes a QD insulin regimen, after poor results with multiple daily injections, and then also with BID insulin.  Interval history: he says he never misses the insulin.  Meter is downloaded today, and the printout is scanned into the record.  It varies from 100-500.  It is in general higher as the day goes on.   PAST MEDICAL HISTORY: Past Medical History:  Diagnosis Date  . Allergic rhinitis   . Arthritis   . At risk for sleep apnea    STOP-BANG= 5   SENT TO PCP 03-14-2014  . Condyloma acuminatum of penis   . Diabetic neuropathy ([place])   . GERD  (gastroesophageal reflux disease)   . History of bladder cancer    s/p  turbt  2013/   transitional cell carcinoma--   . History of condyloma acuminatum    PERINEAL AREA  W/ RECURRENCY  . History of gout   . Hyperlipidemia   . Hypertension   . Lower urinary tract symptoms (LUTS)   . Productive cough   . PVD (peripheral vascular disease) with claudication (HCC)    bilateral SFA disease-- right > left  and left tibial artery disease--  per duplex  . Smokers' cough ([place])   . Type 2 diabetes mellitus with insulin therapy ([place])    monitor by  dr ellsion  . Wears dentures    upper    Past Surgical History:  Procedure Laterality Date  . AXILLARY HIDRADENITIS EXCISION  1997  . CARDIOVASCULAR STRESS TEST  07-24-2014  dr Kathlyn [place]   Low risk lexiscan nuclear study with apical thinning and small inferolateral wall infarct at mid & basal level , no ischemia/  normal LVF and wall motion , ef 59%  . CATARACT EXTRACTION W/ INTRAOCULAR LENS IMPLANT Right   . CO2 LASER APPLICATION N/A 0/27/7412   Procedure: CO2 LASER APPLICATION,PENIS, GROIN, ANUS;  Surgeon: [name], MD;  Location: [place];  Service: General;  Laterality: N/A;  . CO2 LASER APPLICATION N/A 87/86/7672   Procedure: CO2 LASER APPLICATION;  Surgeon: Kathie Rhodes, MD;  Location: [facility];  Service: Urology;  Laterality: N/A;  . CONDYLOMA EXCISION/FULGURATION N/A 05/21/2015   Procedure: CONDYLOMA REMOVAL;  Surgeon: Kathie Rhodes, MD;  Location: [facility];  Service: Urology;  Laterality: N/A;  . HEMORRHOID SURGERY  10/24/2014   Procedure: HEMORRHOIDECTOMY;  Surgeon: Michael Boston, MD;  Location: [facility];  Service: General;;  . INCISION AND DRAINAGE ABSCESS Left 10/24/2014   Procedure: INCISION AND DRAINAGE ABSCESS;  Surgeon: Michael Boston, MD;  Location: [place];  Service: General;  Laterality: Left;  . INGUINAL HIDRADENITIS EXCISION  1998,  [place] N/A 03/20/2014   Procedure: EXAM UNDER ANESTHESIA, REMOVAL/ABLATION OF CONDYLOMATA PENIS,GROINS, ANUS, ANAL CANAL;  Surgeon: [name], MD;  Location: [place];  Service: General;  Laterality: N/A;  groin and anus  . LASER ABLATION CONDOLAMATA N/A 10/24/2014   Procedure: LASER ABLATION CONDOLAMATA;  Surgeon: Michael Boston, MD;  Location[facility];  Service: General;  Laterality: N/A;  . LASER ABLATION OF PENILE AND PERIANAL WARTS  07-29-2007  Dr. Johney Maine  . LEFT SHOULDER SURGERY  2003  . MASS EXCISION N/A 10/24/2014   Procedure: EXCISION OF PERINEAL MASS/SINUS;  Surgeon: Michael Boston, MD;  Location: [place];  Service: General;  Laterality: N/A;  . [place]  . TRANSURETHRAL RESECTION OF BLADDER TUMOR  05/21/2012   Procedure: TRANSURETHRAL RESECTION OF BLADDER TUMOR (TURBT);  Surgeon: Claybon Jabs, MD;  Location: [facility];  Service: Urology;  Laterality: N/A;       Social History   Socioeconomic History  . Marital status: Married    Spouse name: Not on file  . Number of children: Not on file  . Years of education: Not on file  . Highest education level: Not on file  Social Needs  . Financial resource strain: Not on file  . Food insecurity - worry: Not on file  . Food insecurity - inability: Not on file  . Transportation needs - medical: Not on file  . Transportation needs - non-medical: Not on file  Occupational History  . Occupation: Disabled  Tobacco Use  . Smoking status: Former Smoker    Packs/day: 1.00    Years: 41.00    Pack years: 41.00    Types: Cigars, Cigarettes  . Smokeless tobacco: Never Used  Substance and Sexual Activity  . Alcohol use: No    Alcohol/week: 0.0 oz  . Drug use: No  . Sexual activity: Not on file  Other Topics Concern  . Not on file  Social History Narrative  . Not on file    Current Outpatient  Medications on File Prior to Visit  Medication Sig Dispense Refill  . atorvastatin (LIPITOR) 20 MG tablet TAKE 1 TABLET BY MOUTH ONCE DAILY 90 tablet 1  . losartan-hydrochlorothiazide (HYZAAR) 100-25 MG tablet Take 1 tablet by mouth daily. 90 tablet 3  . aspirin EC 81 MG tablet Take 1 tablet (81 mg total) by mouth daily. (Patient not taking: Reported on 09/10/2017)    . lidocaine (LIDODERM) 5 % Place 1 patch onto the skin daily. (Patient not taking: Reported on 09/10/2017) 30 patch 11  . ranitidine (ZANTAC) 150 MG tablet Take 1 tablet (150 mg total) by mouth at bedtime. (Patient not taking: Reported on 09/10/2017) 30 tablet 11   No current facility-administered medications on file prior to visit.     No Known Allergies  Family History  Problem Relation Age of Onset  . Hypertension Mother   . Cancer Father        lung ca  . Diabetes Maternal Aunt        x 2  . Colon cancer Neg Hx   . Esophageal cancer Neg Hx   . Pancreatic cancer Neg Hx   . Prostate cancer Neg Hx   . Kidney disease Neg Hx   . Liver disease Neg Hx   . Lung cancer Neg Hx     BP (!) 162/84 (BP Location: Left Arm, Patient Position: Sitting, Cuff Size: Normal)  Pulse 68   Wt 245 lb 9.6 oz (111.4 kg)   SpO2 97%   BMI 37.34 kg/m   REVIEW OF SYSTEMS:  He denies hypoglycemia.   PHYSICAL EXAMINATION:  VITAL SIGNS:  See vs page GENERAL: no distress Pulses: dorsalis pedis intact bilat.   MSK: no deformity of the feet CV: no leg edema, but there are bilat vv's. Skin:  no ulcer on the feet, but the skin is dry.  normal color and temp on the feet. Neuro: sensation is intact to touch on the feet.  Ext: There is bilateral onychomycosis of the toenails.  LAB/XRAY RESULTS:  Lab Results  Component Value Date   HGBA1C 9.4 09/10/2017   IMPRESSION: Insulin-requiring type 2 DM, with PAD: Worse HTN: he needs increased rx PLAN:  Please increase the insulin to 180 units each morning, and 20 units each evening.  I have sent  a prescription to your pharmacy, to add amlodipine.   Subjective:   Patient here for Medicare annual wellness visit and management of other chronic and acute problems.     Risk factors: advanced age    25 of Physicians Providing Medical Care to Patient:  See "snapshot"   Activities of Daily Living: In your present state of health, do you have any difficulty performing the following activities (lives with wife)?:  Preparing food and eating?: No  Bathing yourself: No  Getting dressed: No  Using the toilet:No  Moving around from place to place: No  In the past year have you fallen or had a near fall?: No    Home Safety: Has smoke detector and wears seat belts. No firearms. No excess sun exposure.   Opioid Use: none   Diet and Exercise  Current exercise habits: pt says not good Dietary issues discussed: pt reports a somewhat healthy diet.    Depression Screen  Q1: Over the past two weeks, have you felt down, depressed or hopeless? no  Q2: Over the past two weeks, have you felt little interest or pleasure in doing things? no   The following portions of the patient's history were reviewed and updated as appropriate: allergies, current medications, past family history, past medical history, past social history, past surgical history and problem list.   Review of Systems  Denies hearing loss; no change in chronic visual loss Objective:   Vision:  Advertising account executive, so he declines VA today.  Hearing: grossly normal Body mass index:  See vs page Msk: pt easily and quickly performs "get-up-and-go" from a sitting position Cognitive Impairment Assessment: cognition, memory and judgment appear normal.  remembers 3/3 at 5 minutes.  excellent recall.  can easily read and write a sentence.  alert and oriented x 3   Assessment:   Medicare wellness utd on preventive parameters    Plan:   During the course of the visit the patient was educated and counseled about appropriate  screening and preventive services including:        Fall prevention is advised today  Diabetes screening is UTD Nutrition counseling is offered  advanced directives/end of life addressed today:  see healthcare directives hyperlink  Vaccines are updated as needed  Patient Instructions (the written plan) was given to the patient.

## 2017-09-10 NOTE — Progress Notes (Signed)
we discussed code status.  pt requests full code, but would not want to be started or maintained on artificial life-support measures if there was not a reasonable chance of recovery 

## 2017-09-22 IMAGING — CR DG TIBIA/FIBULA 2V*R*
4 series · 4 of 4 positions shown · non-contrast
Comparison: None.

CLINICAL DATA: Right leg laceration

EXAM:
RIGHT TIBIA AND FIBULA - 2 VIEW

[x tib-fib ap right (1 of 2)]
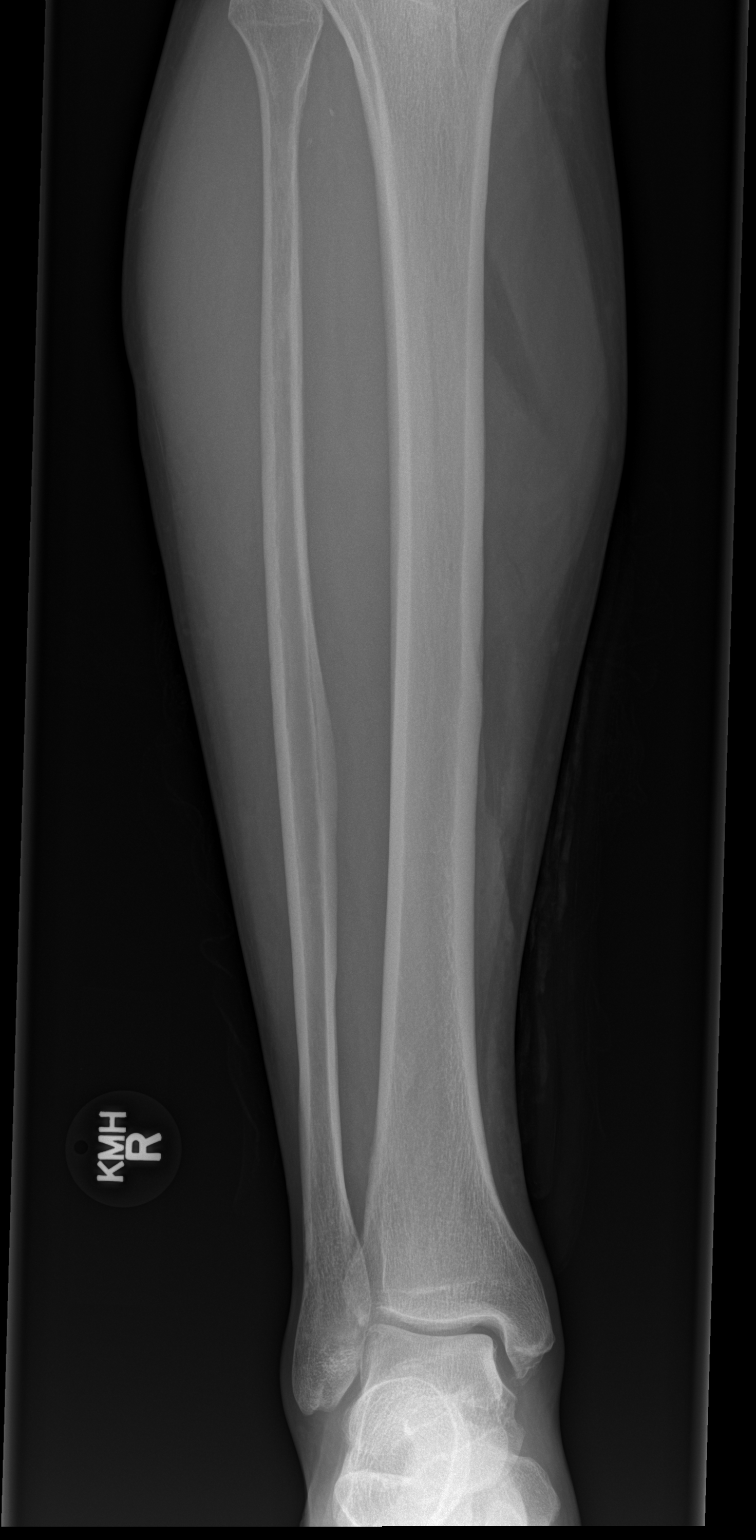

[x tib-fib ap right (2 of 2)]
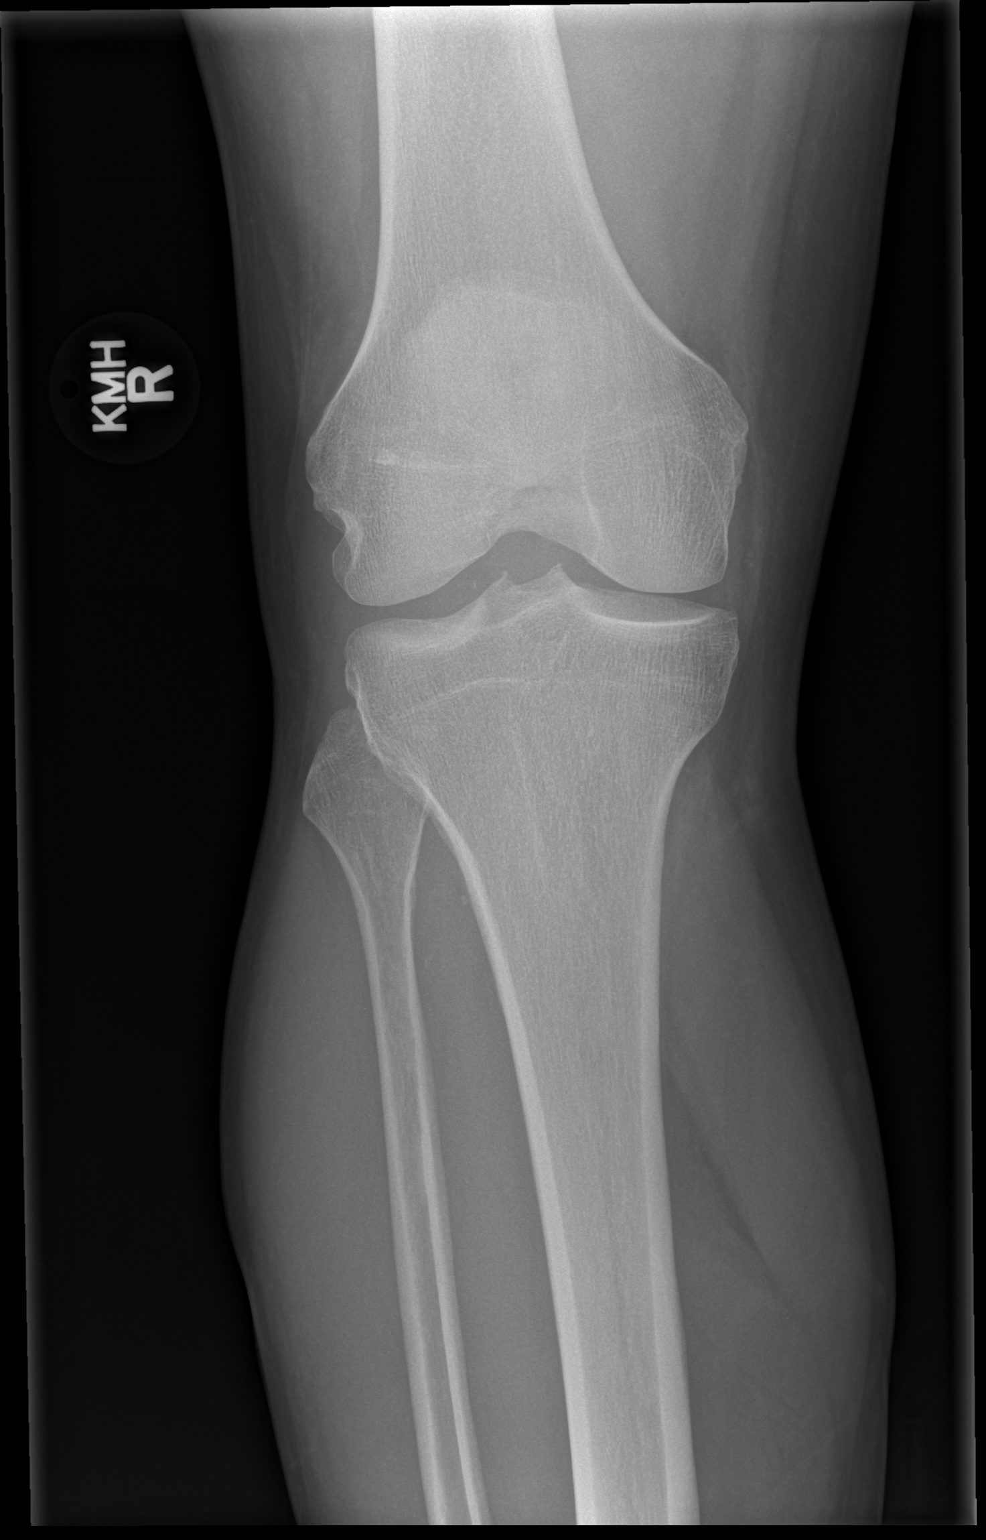

[x tib-fib lat right (1 of 2)]
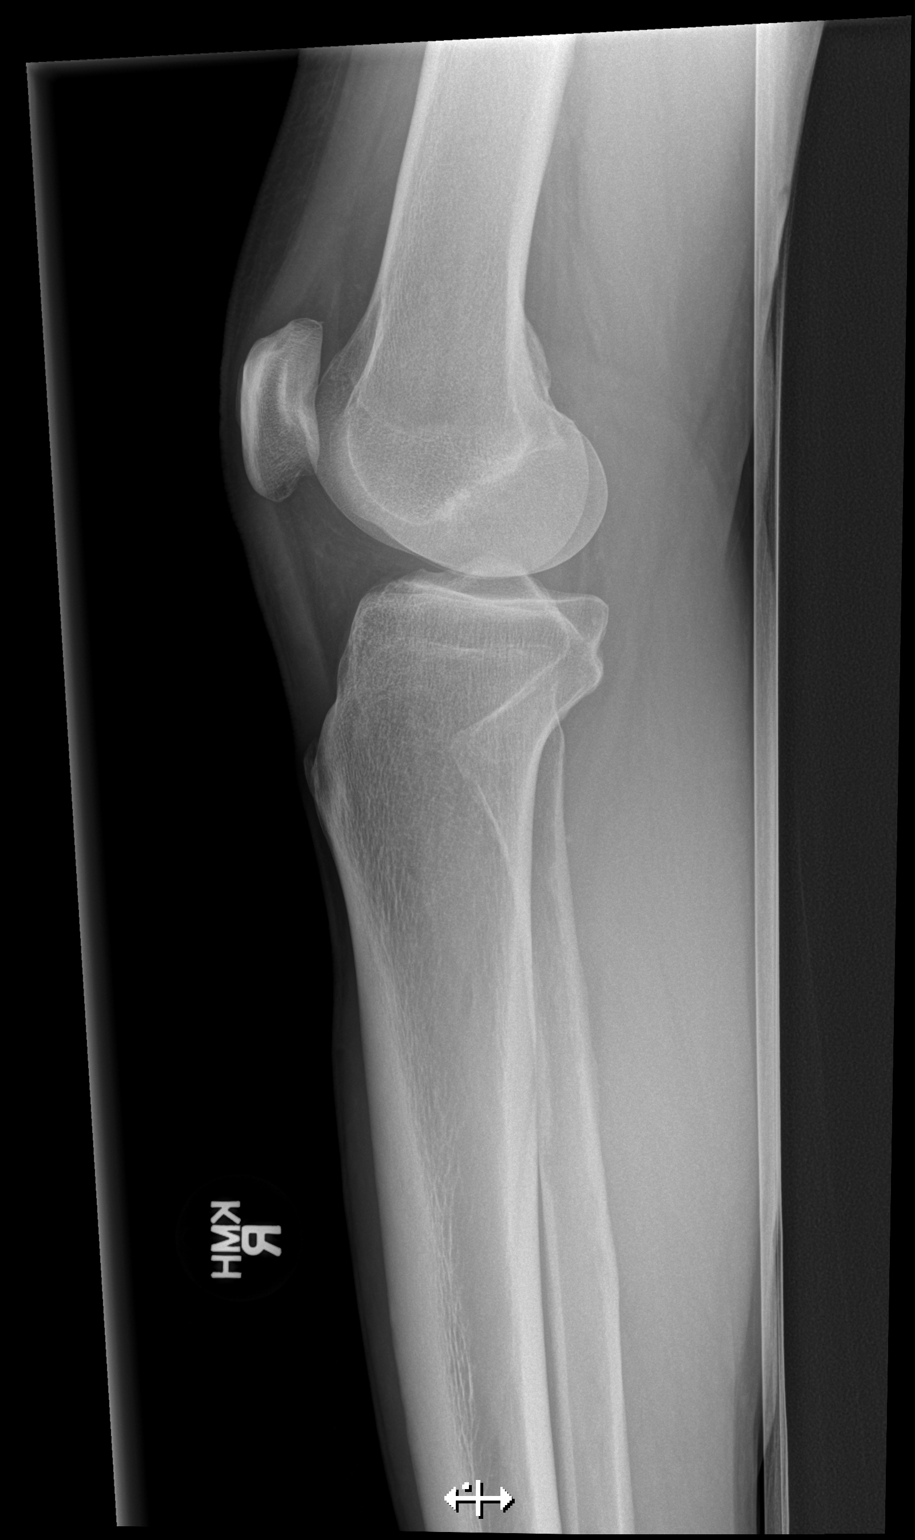

[x tib-fib lat right (2 of 2)]
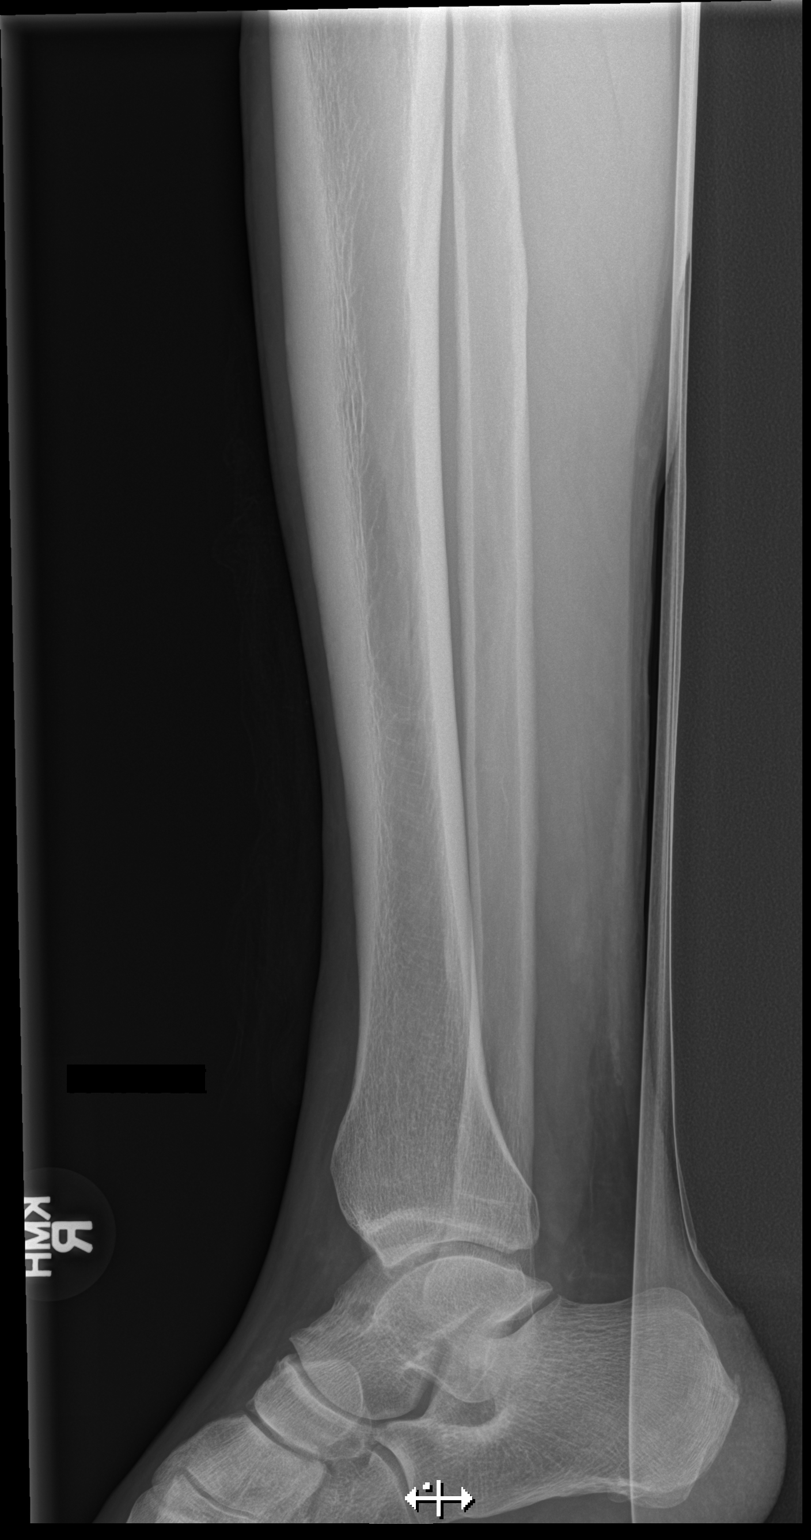

[4 of 4 positions shown; findings below may reference images not displayed]

FINDINGS: Soft tissue lacerations are noted along the medial aspect of the
proximal and mid right leg without underlying osseous involvement.
No radiopaque foreign body. Knee and ankle spaces appear intact.
IMPRESSION: Soft tissue lacerations of the right leg without acute osseous
involvement nor radiopaque foreign body.

## 2017-09-29 DIAGNOSIS — E109 Type 1 diabetes mellitus without complications: Secondary | ICD-10-CM | POA: Diagnosis not present

## 2017-10-01 DIAGNOSIS — L821 Other seborrheic keratosis: Secondary | ICD-10-CM | POA: Diagnosis not present

## 2017-10-01 DIAGNOSIS — D225 Melanocytic nevi of trunk: Secondary | ICD-10-CM | POA: Diagnosis not present

## 2017-10-01 DIAGNOSIS — D2239 Melanocytic nevi of other parts of face: Secondary | ICD-10-CM | POA: Diagnosis not present

## 2017-10-01 DIAGNOSIS — C44390 Other specified malignant neoplasm of skin of unspecified parts of face: Secondary | ICD-10-CM | POA: Diagnosis not present

## 2017-10-01 DIAGNOSIS — C44212 Basal cell carcinoma of skin of right ear and external auricular canal: Secondary | ICD-10-CM | POA: Diagnosis not present

## 2017-10-08 ENCOUNTER — Encounter: Payer: Self-pay | Admitting: Internal Medicine

## 2017-10-15 ENCOUNTER — Other Ambulatory Visit: Payer: Self-pay

## 2017-10-15 MED ORDER — LOSARTAN POTASSIUM-HCTZ 100-25 MG PO TABS: 1.0000 | ORAL_TABLET | Freq: Every day | ORAL | 3 refills | Status: DC

## 2017-10-15 MED ORDER — AMLODIPINE BESYLATE 2.5 MG PO TABS: 2.5000 mg | ORAL_TABLET | Freq: Every day | ORAL | 3 refills | Status: DC

## 2017-10-15 MED ORDER — ATORVASTATIN CALCIUM 20 MG PO TABS: 20.0000 mg | ORAL_TABLET | Freq: Every day | ORAL | 1 refills | Status: DC

## 2017-10-19 ENCOUNTER — Other Ambulatory Visit: Payer: Self-pay

## 2017-10-19 ENCOUNTER — Telehealth: Payer: Self-pay | Admitting: Endocrinology

## 2017-10-19 MED ORDER — INSULIN NPH (HUMAN) (ISOPHANE) 100 UNIT/ML ~~LOC~~ SUSP: SUBCUTANEOUS | 3 refills | Status: DC

## 2017-10-19 NOTE — Telephone Encounter (Signed)
I have sent for a 90 day supply to [facility].

## 2017-10-19 NOTE — Telephone Encounter (Signed)
°  insulin NPH Human (NOVOLIN N) 100 UNIT/ML injection   Patient would like to get this switched to Humalog 90 day supply     [place], [place]

## 2017-10-22 DIAGNOSIS — C44519 Basal cell carcinoma of skin of other part of trunk: Secondary | ICD-10-CM | POA: Diagnosis not present

## 2017-10-26 ENCOUNTER — Telehealth: Payer: Self-pay | Admitting: Endocrinology

## 2017-10-26 NOTE — Telephone Encounter (Signed)
insulin NPH Human (NOVOLIN N) 100 UNIT/ML injection  [employer] called and stated they faxed over a paper for [place] to fill out and fax to patients pharmacy.  They are following up on the status of this paper. They faxed on the 14th.     [phone]

## 2017-11-02 DIAGNOSIS — C44519 Basal cell carcinoma of skin of other part of trunk: Secondary | ICD-10-CM | POA: Diagnosis not present

## 2017-11-05 ENCOUNTER — Ambulatory Visit (INDEPENDENT_AMBULATORY_CARE_PROVIDER_SITE_OTHER): Payer: Medicare Other | Admitting: Endocrinology

## 2017-11-05 ENCOUNTER — Encounter: Payer: Self-pay | Admitting: Endocrinology

## 2017-11-05 VITALS — BP 150/82 | HR 82 | Wt 246.8 lb

## 2017-11-05 DIAGNOSIS — Z794 Long term (current) use of insulin: Secondary | ICD-10-CM

## 2017-11-05 DIAGNOSIS — E1151 Type 2 diabetes mellitus with diabetic peripheral angiopathy without gangrene: Secondary | ICD-10-CM

## 2017-11-05 LAB — POCT GLYCOSYLATED HEMOGLOBIN (HGB A1C): Hemoglobin A1C: 9.2

## 2017-11-05 MED ORDER — METFORMIN HCL ER 500 MG PO TB24: 500.0000 mg | ORAL_TABLET | Freq: Every day | ORAL | 3 refills | Status: DC

## 2017-11-05 MED ORDER — INSULIN NPH (HUMAN) (ISOPHANE) 100 UNIT/ML ~~LOC~~ SUSP: SUBCUTANEOUS | 3 refills | Status: DC

## 2017-11-05 NOTE — Telephone Encounter (Signed)
Patient was in for appointment & paperwork taken care of.

## 2017-11-05 NOTE — Patient Instructions (Addendum)
Please continue the same insulin. I have sent a prescription to your pharmacy, to add metformin.   It is critically important to prevent falling down (keep floor areas well-lit, dry, and free of loose objects.  If you have a cane, walker, or wheelchair, you should use it, even for short trips around the house.  Wear flat-soled shoes.  Also, try not to rush).    Please come back for a follow-up appointment in 2 months.

## 2017-11-05 NOTE — Progress Notes (Signed)
Subjective:    Patient ID: [name], male    DOB: 07/27/1956, 62 y.o.   MRN: [id number]  HPI Pt returns for f/u of diabetes mellitus: DM type: Insulin-requiring type 2.   Dx'ed: 2458 Complications: PAD and polyneuropathy. Therapy: insulin since 2009 DKA: never Severe hypoglycemia: never.  Pancreatitis: never.  Other: he takes a BID insulin regimen, after poor results with multiple daily injections, and then also with BID insulin; he takes human insulin, due to cost.   Interval history: he says he never misses the insulin.  He brings his meter with his cbg's which I have reviewed today. It varies from 147-200's.  It is in general higher as the day goes on.  Pt says he could get lantus cheaper than current insulin.  He also wants to go back to the insulin he was on in 2013, when a1c was 6.9%.  He says his diet is "medium."   Past Medical History:  Diagnosis Date  . Allergic rhinitis   . Arthritis   . At risk for sleep apnea    STOP-BANG= 5   SENT TO PCP 03-14-2014  . Condyloma acuminatum of penis   . Diabetic neuropathy ([place])   . GERD (gastroesophageal reflux disease)   . History of bladder cancer    s/p  turbt  2013/   transitional cell carcinoma--   . History of condyloma acuminatum    PERINEAL AREA  W/ RECURRENCY  . History of gout   . Hyperlipidemia   . Hypertension   . Lower urinary tract symptoms (LUTS)   . Productive cough   . PVD (peripheral vascular disease) with claudication (HCC)    bilateral SFA disease-- right > left  and left tibial artery disease--  per duplex  . Smokers' cough ([place])   . Type 2 diabetes mellitus with insulin therapy ([place])    monitor by  dr ellsion  . Wears dentures    upper    Past Surgical History:  Procedure Laterality Date  . AXILLARY HIDRADENITIS EXCISION  1997  . CARDIOVASCULAR STRESS TEST  07-24-2014  dr Kathlyn [place]   Low risk lexiscan nuclear study with apical thinning and small inferolateral wall infarct at mid & basal  level , no ischemia/  normal LVF and wall motion , ef 59%  . CATARACT EXTRACTION W/ INTRAOCULAR LENS IMPLANT Right   . CO2 LASER APPLICATION N/A 0/99/8338   Procedure: CO2 LASER APPLICATION,PENIS, GROIN, ANUS;  Surgeon: [name], MD;  Location: [place];  Service: General;  Laterality: N/A;  . CO2 LASER APPLICATION N/A 25/12/3974   Procedure: CO2 LASER APPLICATION;  Surgeon: Kathie Rhodes, MD;  Location[facility];  Service: Urology;  Laterality: N/A;  . CONDYLOMA EXCISION/FULGURATION N/A 05/21/2015   Procedure: CONDYLOMA REMOVAL;  Surgeon: Kathie Rhodes, MD;  Location[facility];  Service: Urology;  Laterality: N/A;  . HEMORRHOID SURGERY  10/24/2014   Procedure: HEMORRHOIDECTOMY;  Surgeon: Michael Boston, MD;  Location: [facility];  Service: General;;  . INCISION AND DRAINAGE ABSCESS Left 10/24/2014   Procedure: INCISION AND DRAINAGE ABSCESS;  Surgeon: Michael Boston, MD;  Location: [place];  Service: General;  Laterality: Left;  . INGUINAL HIDRADENITIS EXCISION  1998, 1999  . LASER ABLATION CONDOLAMATA N/A 03/20/2014   Procedure: EXAM UNDER ANESTHESIA, REMOVAL/ABLATION OF CONDYLOMATA PENIS,GROINS, ANUS, ANAL CANAL;  Surgeon: [name], MD;  Location: [place];  Service: General;  Laterality: N/A;  groin and anus  . LASER ABLATION CONDOLAMATA N/A 10/24/2014   Procedure: LASER ABLATION CONDOLAMATA;  Surgeon: Michael Boston, MD;  Location: [facility];  Service: General;  Laterality: N/A;  . LASER ABLATION OF PENILE AND PERIANAL WARTS  07-29-2007  Dr. Johney Maine  . LEFT SHOULDER SURGERY  2003  . MASS EXCISION N/A 10/24/2014   Procedure: EXCISION OF PERINEAL MASS/SINUS;  Surgeon: Michael Boston, MD;  Location: [place];  Service: General;  Laterality: N/A;  . [place]  . TRANSURETHRAL RESECTION OF BLADDER TUMOR  05/21/2012    Procedure: TRANSURETHRAL RESECTION OF BLADDER TUMOR (TURBT);  Surgeon: Claybon Jabs, MD;  Location: [facility];  Service: Urology;  Laterality: N/A;       Social History   Socioeconomic History  . Marital status: Married    Spouse name: Not on file  . Number of children: Not on file  . Years of education: Not on file  . Highest education level: Not on file  Occupational History  . Occupation: Disabled  Social Needs  . Financial resource strain: Not on file  . Food insecurity:    Worry: Not on file    Inability: Not on file  . Transportation needs:    Medical: Not on file    Non-medical: Not on file  Tobacco Use  . Smoking status: Former Smoker    Packs/day: 1.00    Years: 41.00    Pack years: 41.00    Types: Cigars, Cigarettes  . Smokeless tobacco: Never Used  Substance and Sexual Activity  . Alcohol use: No    Alcohol/week: 0.0 oz  . Drug use: No  . Sexual activity: Not on file  Lifestyle  . Physical activity:    Days per week: Not on file    Minutes per session: Not on file  . Stress: Not on file  Relationships  . Social connections:    Talks on phone: Not on file    Gets together: Not on file    Attends religious service: Not on file    Active member of club or organization: Not on file    Attends meetings of clubs or organizations: Not on file    Relationship status: Not on file  . Intimate partner violence:    Fear of current or ex partner: Not on file    Emotionally abused: Not on file    Physically abused: Not on file    Forced sexual activity: Not on file  Other Topics Concern  . Not on file  Social History Narrative  . Not on file    Current Outpatient Medications on File Prior to Visit  Medication Sig Dispense Refill  . amLODipine (NORVASC) 2.5 MG tablet Take 1 tablet (2.5 mg total) by mouth daily. 90 tablet 3  . aspirin EC 81 MG tablet Take 1 tablet (81 mg total) by mouth daily.    Marland Kitchen atorvastatin (LIPITOR) 20 MG tablet Take 1  tablet (20 mg total) by mouth daily. 90 tablet 1  . lidocaine (LIDODERM) 5 % Place 1 patch onto the skin daily. 30 patch 11  . losartan-hydrochlorothiazide (HYZAAR) 100-25 MG tablet Take 1 tablet by mouth daily. 90 tablet 3  . ranitidine (ZANTAC) 150 MG tablet Take 1 tablet (150 mg total) by mouth at bedtime. 30 tablet 11  . sulfamethoxazole-trimethoprim (BACTRIM DS,SEPTRA DS) 800-160 MG tablet Take 1 tablet by mouth 2 (two) times daily. Take two tablets two times daily  for one week.     No current facility-administered medications on file prior to visit.     No Known Allergies  Family History  Problem Relation Age of Onset  . Hypertension Mother   . Cancer Father        lung ca  . Diabetes Maternal Aunt        x 2  . Colon cancer Neg Hx   . Esophageal cancer Neg Hx   . Pancreatic cancer Neg Hx   . Prostate cancer Neg Hx   . Kidney disease Neg Hx   . Liver disease Neg Hx   . Lung cancer Neg Hx     BP (!) 150/82 (BP Location: Left Arm, Patient Position: Sitting, Cuff Size: Normal)   Pulse 82   Wt 246 lb 12.8 oz (111.9 kg)   SpO2 96%   BMI 37.53 kg/m   Review of Systems He denies hypoglycemia.      Objective:   Physical Exam VITAL SIGNS:  See vs page GENERAL: no distress Pulses: dorsalis pedis intact bilat.   MSK: no deformity of the feet CV: no leg edema, but there are bilat vv's. Skin:  no ulcer on the feet, but the skin is dry.  normal color and temp on the feet. Neuro: sensation is intact to touch on the feet.  Ext: There is bilateral onychomycosis of the toenails.   I reviewed med record.  In 2013, he was younger, and weight 60 lbs less, and was on the same type of insulin he in on now.  Lab Results  Component Value Date   CREATININE 1.10 01/08/2017   BUN 14 01/08/2017   NA 137 01/08/2017   K 3.9 01/08/2017   CL 99 01/08/2017   CO2 30 01/08/2017      Assessment & Plan:  Insulin-requiring type 2 DM, with PAD: I explained to pt that name brand insulin  would quickly put him in the donut hole.  I advised him to increase, but he declines.  We discussed. He agrees to add metformin instead.  We discussed the difference in the 2013 situation from now.    Patient Instructions  Please continue the same insulin. I have sent a prescription to your pharmacy, to add metformin.   It is critically important to prevent falling down (keep floor areas well-lit, dry, and free of loose objects.  If you have a cane, walker, or wheelchair, you should use it, even for short trips around the house.  Wear flat-soled shoes.  Also, try not to rush).    Please come back for a follow-up appointment in 2 months.

## 2017-11-27 ENCOUNTER — Ambulatory Visit: Payer: Medicare Other | Admitting: Cardiovascular Disease

## 2018-01-11 ENCOUNTER — Encounter: Payer: Self-pay | Admitting: Endocrinology

## 2018-01-11 ENCOUNTER — Ambulatory Visit (INDEPENDENT_AMBULATORY_CARE_PROVIDER_SITE_OTHER): Payer: Medicare Other | Admitting: Endocrinology

## 2018-01-11 VITALS — BP 140/68 | HR 87 | Ht 68.0 in | Wt 249.0 lb

## 2018-01-11 DIAGNOSIS — E0800 Diabetes mellitus due to underlying condition with hyperosmolarity without nonketotic hyperglycemic-hyperosmolar coma (NKHHC): Secondary | ICD-10-CM | POA: Diagnosis not present

## 2018-01-11 LAB — POCT GLYCOSYLATED HEMOGLOBIN (HGB A1C): HEMOGLOBIN A1C: 8.9 % — AB (ref 4.0–5.6)

## 2018-01-11 MED ORDER — INSULIN NPH (HUMAN) (ISOPHANE) 100 UNIT/ML ~~LOC~~ SUSP
SUBCUTANEOUS | 3 refills | Status: DC
Start: 2018-01-11 — End: 2018-06-07

## 2018-01-11 NOTE — Patient Instructions (Addendum)
Please increase the insulin to the numbers listed below. Keep the skin ulcers covered with antibiotic ointment and large bandaids.  Call if they get worse. check your blood sugar twice a day.  vary the time of day when you check, between before the 3 meals, and at bedtime.  also check if you have symptoms of your blood sugar being too high or too low.  please keep a record of the readings and bring it to your next appointment here (or you can bring the meter itself).  You can write it on any piece of paper.  please call us sooner if your blood sugar goes below 70, or if you have a lot of readings over 200. Please come back for a follow-up appointment in 2 months.

## 2018-01-11 NOTE — Progress Notes (Signed)
Subjective:    Patient ID: [name], male    DOB: August 17, 1955, 62 y.o.   MRN: [id number]  HPI Pt returns for f/u of diabetes mellitus: DM type: Insulin-requiring type 2.   Dx'ed: 3976 Complications: PAD and polyneuropathy. Therapy: insulin since 2009 DKA: never Severe hypoglycemia: never.  Pancreatitis: never.  Other: he takes a BID insulin regimen, after poor results with multiple daily injections; he takes human insulin, due to cost.   Interval history: Meter is downloaded today, and the printout is scanned into the record.  It varies from 100-290.  There is no trend throughout the day.  He eats breakfast at noon.  pt states he feels well in general.  Pt says he never misses the insulin.   Past Medical History:  Diagnosis Date  . Allergic rhinitis   . Arthritis   . At risk for sleep apnea    STOP-BANG= 5   SENT TO PCP 03-14-2014  . Condyloma acuminatum of penis   . Diabetic neuropathy ([place])   . GERD (gastroesophageal reflux disease)   . History of bladder cancer    s/p  turbt  2013/   transitional cell carcinoma--   . History of condyloma acuminatum    PERINEAL AREA  W/ RECURRENCY  . History of gout   . Hyperlipidemia   . Hypertension   . Lower urinary tract symptoms (LUTS)   . Productive cough   . PVD (peripheral vascular disease) with claudication (HCC)    bilateral SFA disease-- right > left  and left tibial artery disease--  per duplex  . Smokers' cough ([place])   . Type 2 diabetes mellitus with insulin therapy ([place])    monitor by  dr ellsion  . Wears dentures    upper    Past Surgical History:  Procedure Laterality Date  . AXILLARY HIDRADENITIS EXCISION  1997  . CARDIOVASCULAR STRESS TEST  07-24-2014  dr Kathlyn [place]   Low risk lexiscan nuclear study with apical thinning and small inferolateral wall infarct at mid & basal level , no ischemia/  normal LVF and wall motion , ef 59%  . CATARACT EXTRACTION W/ INTRAOCULAR LENS IMPLANT Right   . CO2 LASER  APPLICATION N/A 7/34/1937   Procedure: CO2 LASER APPLICATION,PENIS, GROIN, ANUS;  Surgeon: [name], MD;  Location: [place];  Service: General;  Laterality: N/A;  . CO2 LASER APPLICATION N/A 90/24/0973   Procedure: CO2 LASER APPLICATION;  Surgeon: Kathie Rhodes, MD;  Location: [facility];  Service: Urology;  Laterality: N/A;  . CONDYLOMA EXCISION/FULGURATION N/A 05/21/2015   Procedure: CONDYLOMA REMOVAL;  Surgeon: Kathie Rhodes, MD;  Location: [facility];  Service: Urology;  Laterality: N/A;  . HEMORRHOID SURGERY  10/24/2014   Procedure: HEMORRHOIDECTOMY;  Surgeon: Michael Boston, MD;  Location: [facility];  Service: General;;  . INCISION AND DRAINAGE ABSCESS Left 10/24/2014   Procedure: INCISION AND DRAINAGE ABSCESS;  Surgeon: Michael Boston, MD;  Location: [place];  Service: General;  Laterality: Left;  . INGUINAL HIDRADENITIS EXCISION  1998, 1999  . LASER ABLATION CONDOLAMATA N/A 03/20/2014   Procedure: EXAM UNDER ANESTHESIA, REMOVAL/ABLATION OF CONDYLOMATA PENIS,GROINS, ANUS, ANAL CANAL;  Surgeon: [name], MD;  Location: [place];  Service: General;  Laterality: N/A;  groin and anus  . LASER ABLATION CONDOLAMATA N/A 10/24/2014   Procedure: LASER ABLATION CONDOLAMATA;  Surgeon: Michael Boston, MD;  Location: [facility];  Service: General;  Laterality: N/A;  . LASER ABLATION OF PENILE AND PERIANAL WARTS  07-29-2007  Dr. Johney Maine  . LEFT SHOULDER SURGERY  2003  . MASS EXCISION N/A 10/24/2014   Procedure: EXCISION OF PERINEAL MASS/SINUS;  Surgeon: Michael Boston, MD;  Location: [place];  Service: General;  Laterality: N/A;  . [place]  . TRANSURETHRAL RESECTION OF BLADDER TUMOR  05/21/2012   Procedure: TRANSURETHRAL RESECTION OF BLADDER TUMOR (TURBT);  Surgeon: Claybon Jabs, MD;  Location: [facility];  Service:  Urology;  Laterality: N/A;       Social History   Socioeconomic History  . Marital status: Married    Spouse name: Not on file  . Number of children: Not on file  . Years of education: Not on file  . Highest education level: Not on file  Occupational History  . Occupation: Disabled  Social Needs  . Financial resource strain: Not on file  . Food insecurity:    Worry: Not on file    Inability: Not on file  . Transportation needs:    Medical: Not on file    Non-medical: Not on file  Tobacco Use  . Smoking status: Former Smoker    Packs/day: 1.00    Years: 41.00    Pack years: 41.00    Types: Cigars, Cigarettes  . Smokeless tobacco: Never Used  Substance and Sexual Activity  . Alcohol use: No    Alcohol/week: 0.0 oz  . Drug use: No  . Sexual activity: Not on file  Lifestyle  . Physical activity:    Days per week: Not on file    Minutes per session: Not on file  . Stress: Not on file  Relationships  . Social connections:    Talks on phone: Not on file    Gets together: Not on file    Attends religious service: Not on file    Active member of club or organization: Not on file    Attends meetings of clubs or organizations: Not on file    Relationship status: Not on file  . Intimate partner violence:    Fear of current or ex partner: Not on file    Emotionally abused: Not on file    Physically abused: Not on file    Forced sexual activity: Not on file  Other Topics Concern  . Not on file  Social History Narrative  . Not on file    Current Outpatient Medications on File Prior to Visit  Medication Sig Dispense Refill  . amLODipine (NORVASC) 2.5 MG tablet Take 1 tablet (2.5 mg total) by mouth daily. 90 tablet 3  . aspirin EC 81 MG tablet Take 1 tablet (81 mg total) by mouth daily.    Marland Kitchen atorvastatin (LIPITOR) 20 MG tablet Take 1 tablet (20 mg total) by mouth daily. 90 tablet 1  . lidocaine (LIDODERM) 5 % Place 1 patch onto the skin daily. 30 patch 11  .  losartan-hydrochlorothiazide (HYZAAR) 100-25 MG tablet Take 1 tablet by mouth daily. 90 tablet 3  . metFORMIN (GLUCOPHAGE-XR) 500 MG 24 hr tablet Take 1 tablet (500 mg total) by mouth daily with breakfast. 90 tablet 3  . ranitidine (ZANTAC) 150 MG tablet Take 1 tablet (150 mg total) by mouth at bedtime. 30 tablet 11   No current facility-administered medications on file prior to visit.     No Known Allergies  Family History  Problem Relation Age of Onset  . Hypertension Mother   .  Cancer Father        lung ca  . Diabetes Maternal Aunt        x 2  . Colon cancer Neg Hx   . Esophageal cancer Neg Hx   . Pancreatic cancer Neg Hx   . Prostate cancer Neg Hx   . Kidney disease Neg Hx   . Liver disease Neg Hx   . Lung cancer Neg Hx     BP 140/68 (BP Location: Left Arm, Patient Position: Sitting, Cuff Size: Normal)   Pulse 87   Ht 5\' 8"  (1.727 m)   Wt 249 lb (112.9 kg)   SpO2 98%   BMI 37.86 kg/m   Review of Systems Denies fever.      Objective:   Physical Exam VITAL SIGNS:  See vs page GENERAL: no distress Pulses: dorsalis pedis intact bilat.   MSK: no deformity of the feet CV: no leg edema, but there are bilat vv's. Skin: 1 shallow ulcer on each ant tibial area (pt says due to minor injuries).  Little if any erythema.  No drainage.  normal color and temp on the feet. Neuro: sensation is intact to touch on the feet.  Ext: There is bilateral onychomycosis of the toenails.    Lab Results  Component Value Date   HGBA1C 8.9 (A) 01/11/2018      Assessment & Plan:  Insulin-requiring type 2 DM: he needs increased rx Leg ulcers, new HTN: recheck next time.   Patient Instructions  Please increase the insulin to the numbers listed below. Keep the skin ulcers covered with antibiotic ointment and large bandaids.  Call if they get worse. check your blood sugar twice a day.  vary the time of day when you check, between before the 3 meals, and at bedtime.  also check if you have  symptoms of your blood sugar being too high or too low.  please keep a record of the readings and bring it to your next appointment here (or you can bring the meter itself).  You can write it on any piece of paper.  please call us sooner if your blood sugar goes below 70, or if you have a lot of readings over 200. Please come back for a follow-up appointment in 2 months.

## 2018-01-25 DIAGNOSIS — H25812 Combined forms of age-related cataract, left eye: Secondary | ICD-10-CM | POA: Diagnosis not present

## 2018-01-25 DIAGNOSIS — Z01818 Encounter for other preprocedural examination: Secondary | ICD-10-CM | POA: Diagnosis not present

## 2018-01-25 DIAGNOSIS — E119 Type 2 diabetes mellitus without complications: Secondary | ICD-10-CM | POA: Diagnosis not present

## 2018-02-15 ENCOUNTER — Other Ambulatory Visit: Payer: Self-pay | Admitting: Endocrinology

## 2018-02-18 DIAGNOSIS — H25812 Combined forms of age-related cataract, left eye: Secondary | ICD-10-CM | POA: Diagnosis not present

## 2018-02-18 DIAGNOSIS — H2512 Age-related nuclear cataract, left eye: Secondary | ICD-10-CM | POA: Diagnosis not present

## 2018-02-25 ENCOUNTER — Ambulatory Visit: Payer: Medicare Other | Admitting: Cardiovascular Disease

## 2018-02-25 ENCOUNTER — Encounter: Payer: Self-pay | Admitting: Cardiovascular Disease

## 2018-02-25 VITALS — BP 138/72 | HR 78 | Ht 68.0 in | Wt 254.5 lb

## 2018-02-25 DIAGNOSIS — I10 Essential (primary) hypertension: Secondary | ICD-10-CM | POA: Diagnosis not present

## 2018-02-25 DIAGNOSIS — E785 Hyperlipidemia, unspecified: Secondary | ICD-10-CM

## 2018-02-25 DIAGNOSIS — I739 Peripheral vascular disease, unspecified: Secondary | ICD-10-CM | POA: Diagnosis not present

## 2018-02-25 DIAGNOSIS — I2510 Atherosclerotic heart disease of native coronary artery without angina pectoris: Secondary | ICD-10-CM

## 2018-02-25 DIAGNOSIS — R0602 Shortness of breath: Secondary | ICD-10-CM | POA: Diagnosis not present

## 2018-02-25 NOTE — Progress Notes (Signed)
Cardiology Office Note   Date:  02/25/2018   ID:  Paul, [name] Jul 04, 1956, MRN [id number]  PCP:  [name], MD  Cardiologist:   Kathlyn Sacramento, MD   Chief Complaint  Patient presents with  . other    12 month f/u c/o right side chest pain, arms feel like he has "paper tape around them" and pt feels like "electrical shock when laying down." Meds reviewed verbally with pt.      History of Present Illness:  [name] is a 62 y.o. male who presents for a follow up visit regarding PAD. He has known history of insulin-requiring DM (dx'ed 1992),  Hyperlipidemia, hypertension and tobacco use. He is followed for right calf claudication due to occluded right SFA. Noninvasive evaluation in August 2015 showed ABI: normal on the left and 0.44 on right.  Duplex showed long occlusion of right SFA from mid to distal segment with reconstitution in popliteal artery. There was significant left SFA stenosis.   He had atypical chest pain in late 2015. Nuclear stress test showed a small inferolateral infarct with no significant ischemia. He was treated medically given that his chest pain resolved.   He quit smoking in December 2017.  Unfortunately, he gained significant amount of weight since then.  He had worsening control of diabetes requiring increasing the dose of insulin.  He denies any chest pain.  He reports worsening dyspnea.  He has stable right calf claudication.  He seems to be mostly frustrated with his diabetes control.  He is looking to establish with a new primary care physician.  Past Medical History:  Diagnosis Date  . Allergic rhinitis   . Arthritis   . At risk for sleep apnea    STOP-BANG= 5   SENT TO PCP 03-14-2014  . Condyloma acuminatum of penis   . Diabetic neuropathy ([place])   . GERD (gastroesophageal reflux disease)   . History of bladder cancer    s/p  turbt  2013/   transitional cell carcinoma--   . History of condyloma acuminatum    PERINEAL AREA  W/  RECURRENCY  . History of gout   . Hyperlipidemia   . Hypertension   . Lower urinary tract symptoms (LUTS)   . Productive cough   . PVD (peripheral vascular disease) with claudication (HCC)    bilateral SFA disease-- right > left  and left tibial artery disease--  per duplex  . Smokers' cough ([place])   . Type 2 diabetes mellitus with insulin therapy ([place])    monitor by  dr ellsion  . Wears dentures    upper    Past Surgical History:  Procedure Laterality Date  . AXILLARY HIDRADENITIS EXCISION  1997  . CARDIOVASCULAR STRESS TEST  07-24-2014  dr Kathlyn Sacramento   Low risk lexiscan nuclear study with apical thinning and small inferolateral wall infarct at mid & basal level , no ischemia/  normal LVF and wall motion , ef 59%  . CATARACT EXTRACTION Left   . CATARACT EXTRACTION W/ INTRAOCULAR LENS IMPLANT Right   . CO2 LASER APPLICATION N/A 5/62/5638   Procedure: CO2 LASER APPLICATION,PENIS, GROIN, ANUS;  Surgeon: [name], MD;  Location: [place];  Service: General;  Laterality: N/A;  . CO2 LASER APPLICATION N/A 93/73/4287   Procedure: CO2 LASER APPLICATION;  Surgeon: Kathie Rhodes, MD;  Location: [facility];  Service: Urology;  Laterality: N/A;  . CONDYLOMA EXCISION/FULGURATION N/A 05/21/2015   Procedure: CONDYLOMA  REMOVAL;  Surgeon: Kathie Rhodes, MD;  Location: [facility];  Service: Urology;  Laterality: N/A;  . HEMORRHOID SURGERY  10/24/2014   Procedure: HEMORRHOIDECTOMY;  Surgeon: Michael Boston, MD;  Location: [facility];  Service: General;;  . INCISION AND DRAINAGE ABSCESS Left 10/24/2014   Procedure: INCISION AND DRAINAGE ABSCESS;  Surgeon: Michael Boston, MD;  Location: [place];  Service: General;  Laterality: Left;  . INGUINAL HIDRADENITIS EXCISION  1998, 1999  . LASER ABLATION CONDOLAMATA N/A 03/20/2014   Procedure: EXAM UNDER ANESTHESIA, REMOVAL/ABLATION OF CONDYLOMATA PENIS,GROINS, ANUS, ANAL  CANAL;  Surgeon: [name], MD;  Location: [place];  Service: General;  Laterality: N/A;  groin and anus  . LASER ABLATION CONDOLAMATA N/A 10/24/2014   Procedure: LASER ABLATION CONDOLAMATA;  Surgeon: Michael Boston, MD;  Location[facility];  Service: General;  Laterality: N/A;  . LASER ABLATION OF PENILE AND PERIANAL WARTS  07-29-2007  Dr. Johney Maine  . LEFT SHOULDER SURGERY  2003  . MASS EXCISION N/A 10/24/2014   Procedure: EXCISION OF PERINEAL MASS/SINUS;  Surgeon: Michael Boston, MD;  Location: [place];  Service: General;  Laterality: N/A;  . MOHS SURGERY     back  . PERINEAL HIDRADENITIS EXCISION  1998, 1999  . TRANSURETHRAL RESECTION OF BLADDER TUMOR  05/21/2012   Procedure: TRANSURETHRAL RESECTION OF BLADDER TUMOR (TURBT);  Surgeon: Claybon Jabs, MD;  Location[facility];  Service: Urology;  Laterality: N/A;        Current Outpatient Medications  Medication Sig Dispense Refill  . amLODipine (NORVASC) 2.5 MG tablet Take 1 tablet (2.5 mg total) by mouth daily. 90 tablet 3  . aspirin EC 81 MG tablet Take 1 tablet (81 mg total) by mouth daily.    Marland Kitchen atorvastatin (LIPITOR) 20 MG tablet TAKE 1 TABLET BY MOUTH  DAILY 90 tablet 1  . insulin NPH Human (NOVOLIN N) 100 UNIT/ML injection 190 units each morning, and 30 units each evening, and syringes 1/day. 210 mL 3  . losartan-hydrochlorothiazide (HYZAAR) 100-25 MG tablet Take 1 tablet by mouth daily. 90 tablet 3  . metFORMIN (GLUCOPHAGE-XR) 500 MG 24 hr tablet Take 1 tablet (500 mg total) by mouth daily with breakfast. 90 tablet 3   No current facility-administered medications for this visit.     Allergies:   Patient has no known allergies.    Social History:  The patient  reports that he has quit smoking. His smoking use included cigars and cigarettes. He has a 41.00 pack-year smoking history. He has never used smokeless tobacco. He reports that he does not drink  alcohol or use drugs.   Family History:  The patient's family history includes Cancer in his father; Diabetes in his maternal aunt; Hypertension in his mother.    ROS:  Please see the history of present illness.   Otherwise, review of systems are positive for none.   All other systems are reviewed and negative.    PHYSICAL EXAM: VS:  BP 138/72 (BP Location: Left Arm, Patient Position: Sitting, Cuff Size: Normal)   Pulse 78   Ht 5\' 8"  (1.727 m)   Wt 254 lb 8 oz (115.4 kg)   BMI 38.70 kg/m  , BMI Body mass index is 38.7 kg/m. GEN: Well nourished, well developed, in no acute distress  HEENT: normal  Neck: no JVD, carotid bruits, or masses Cardiac: RRR; no murmurs, rubs, or gallops, trace edema  Respiratory:  clear  to auscultation bilaterally, normal work of breathing GI: soft, nontender, nondistended, + BS MS: no deformity or atrophy  Skin: warm and dry, no rash Neuro:  Strength and sensation are intact Psych: euthymic mood, full affect   EKG:  EKG is ordered today. EKG showed normal sinus rhythm with PACs and left axis deviation.   Recent Labs: No results found for requested labs within last 8760 hours.    Lipid Panel    Component Value Date/Time   CHOL 129 01/08/2017 1333   TRIG 234.0 (H) 01/08/2017 1333   HDL 32.20 (L) 01/08/2017 1333   CHOLHDL 4 01/08/2017 1333   VLDL 46.8 (H) 01/08/2017 1333   LDLCALC 61 12/31/2015 1334   LDLDIRECT 67.0 01/08/2017 1333      Wt Readings from Last 3 Encounters:  02/25/18 254 lb 8 oz (115.4 kg)  01/11/18 249 lb (112.9 kg)  11/05/17 246 lb 12.8 oz (111.9 kg)       ASSESSMENT AND PLAN:  1.  Peripheral arterial disease: Known occluded right SFA .he has mild to moderate non-lifestyle limiting right calf claudication.  Continue medical therapy.  2. Coronary artery disease involving native coronary arteries without angina: Previous stress test in 2015 showed evidence of a small prior inferolateral infarct. No anginal symptoms.    Given worsening shortness of breath, I requested an echocardiogram.  Some of his dyspnea might be related to progressive weight gain.  3. Essential hypertension: Blood pressure is more controlled now with current medications.  I requested basic metabolic profile.  4. Hyperlipidemia: Continue treatment with atorvastatin with a target LDL of less than 70.  I requested lipid and liver profile.    Disposition:   FU with me in 6 months.  Signed,  Kathlyn Sacramento, MD  02/25/2018 12:57 PM    [place]

## 2018-02-25 NOTE — Patient Instructions (Addendum)
Medication Instructions: Your physician recommends that you continue on your current medications as directed. Please refer to the Current Medication list given to you today.  If you need a refill on your cardiac medications before your next appointment, please call your pharmacy.   Labwork: Your provider would like for you to return when you have your ECHO to have the following labs drawn: FASTING lipid, liver, and BMET.   Procedure: Your physician has requested that you have an echocardiogram. Echocardiography is a painless test that uses sound waves to create images of your heart. It provides your doctor with information about the size and shape of your heart and how well your heart's chambers and valves are working. You may receive an ultrasound enhancing agent through an IV if needed to better visualize your heart during the echo.This procedure takes approximately one hour. There are no restrictions for this procedure. This will take place at the [facility] clinic.    Follow-Up: Your physician wants you to follow-up in 6 months with Dr. Fletcher Anon. You will receive a reminder letter in the mail two months in advance. If you don't receive a letter, please call our office at [phone] to schedule this follow-up appointment.   [place] at [facility] Number is: [phone]  Thank you for choosing [facility] at [facility]!

## 2018-03-16 ENCOUNTER — Other Ambulatory Visit: Payer: Self-pay | Admitting: Cardiovascular Disease

## 2018-03-16 DIAGNOSIS — R0600 Dyspnea, unspecified: Secondary | ICD-10-CM

## 2018-03-16 DIAGNOSIS — R079 Chest pain, unspecified: Secondary | ICD-10-CM

## 2018-03-25 ENCOUNTER — Encounter: Payer: Self-pay | Admitting: Endocrinology

## 2018-03-25 ENCOUNTER — Ambulatory Visit (INDEPENDENT_AMBULATORY_CARE_PROVIDER_SITE_OTHER): Payer: Medicare Other | Admitting: Endocrinology

## 2018-03-25 VITALS — BP 178/88 | HR 85 | Ht 68.0 in | Wt 255.8 lb

## 2018-03-25 DIAGNOSIS — Z794 Long term (current) use of insulin: Secondary | ICD-10-CM

## 2018-03-25 DIAGNOSIS — E1151 Type 2 diabetes mellitus with diabetic peripheral angiopathy without gangrene: Secondary | ICD-10-CM

## 2018-03-25 LAB — POCT GLYCOSYLATED HEMOGLOBIN (HGB A1C): HEMOGLOBIN A1C: 7.8 % — AB (ref 4.0–5.6)

## 2018-03-25 NOTE — Progress Notes (Signed)
Subjective:    Patient ID: [name], male    DOB: 1956/03/31, 62 y.o.   MRN: [id number]  HPI Pt returns for f/u of diabetes mellitus: DM type: Insulin-requiring type 2.   Dx'ed: 1660 Complications: PAD and polyneuropathy. Therapy: insulin since 2009 DKA: never Severe hypoglycemia: never.  Pancreatitis: never.  Other: he takes a BID insulin regimen, after poor results with multiple daily injections; he takes human insulin, due to cost.   Interval history:  Pt states 2 years of intermitt moderate right sided abd pain.  No assoc diarrhea.   numerous other symptoms.   Pt says today's BP elev is situational Past Medical History:  Diagnosis Date  . Allergic rhinitis   . Arthritis   . At risk for sleep apnea    STOP-BANG= 5   SENT TO PCP 03-14-2014  . Condyloma acuminatum of penis   . Diabetic neuropathy ([place])   . GERD (gastroesophageal reflux disease)   . History of bladder cancer    s/p  turbt  2013/   transitional cell carcinoma--   . History of condyloma acuminatum    PERINEAL AREA  W/ RECURRENCY  . History of gout   . Hyperlipidemia   . Hypertension   . Lower urinary tract symptoms (LUTS)   . Productive cough   . PVD (peripheral vascular disease) with claudication (HCC)    bilateral SFA disease-- right > left  and left tibial artery disease--  per duplex  . Smokers' cough ([place])   . Type 2 diabetes mellitus with insulin therapy ([place])    monitor by  dr ellsion  . Wears dentures    upper    Past Surgical History:  Procedure Laterality Date  . AXILLARY HIDRADENITIS EXCISION  1997  . CARDIOVASCULAR STRESS TEST  07-24-2014  dr Kathlyn Sacramento   Low risk lexiscan nuclear study with apical thinning and small inferolateral wall infarct at mid & basal level , no ischemia/  normal LVF and wall motion , ef 59%  . CATARACT EXTRACTION Left   . CATARACT EXTRACTION W/ INTRAOCULAR LENS IMPLANT Right   . CO2 LASER APPLICATION N/A 02/08/1600   Procedure: CO2 LASER  APPLICATION,PENIS, GROIN, ANUS;  Surgeon: [name], MD;  Location: [place];  Service: General;  Laterality: N/A;  . CO2 LASER APPLICATION N/A 09/32/3557   Procedure: CO2 LASER APPLICATION;  Surgeon: Kathie Rhodes, MD;  Location: [facility];  Service: Urology;  Laterality: N/A;  . CONDYLOMA EXCISION/FULGURATION N/A 05/21/2015   Procedure: CONDYLOMA REMOVAL;  Surgeon: Kathie Rhodes, MD;  Location: [facility];  Service: Urology;  Laterality: N/A;  . HEMORRHOID SURGERY  10/24/2014   Procedure: HEMORRHOIDECTOMY;  Surgeon: Michael Boston, MD;  Location: [facility];  Service: General;;  . INCISION AND DRAINAGE ABSCESS Left 10/24/2014   Procedure: INCISION AND DRAINAGE ABSCESS;  Surgeon: Michael Boston, MD;  Location: [place];  Service: General;  Laterality: Left;  . INGUINAL HIDRADENITIS EXCISION  1998, 1999  . LASER ABLATION CONDOLAMATA N/A 03/20/2014   Procedure: EXAM UNDER ANESTHESIA, REMOVAL/ABLATION OF CONDYLOMATA PENIS,GROINS, ANUS, ANAL CANAL;  Surgeon: [name], MD;  Location: [place];  Service: General;  Laterality: N/A;  groin and anus  . LASER ABLATION CONDOLAMATA N/A 10/24/2014   Procedure: LASER ABLATION CONDOLAMATA;  Surgeon: Michael Boston, MD;  Location: [facility];  Service: General;  Laterality: N/A;  . LASER ABLATION OF PENILE AND PERIANAL WARTS  07-29-2007  Dr. Johney Maine  . LEFT SHOULDER SURGERY  2003  . MASS EXCISION N/A 10/24/2014   Procedure: EXCISION OF PERINEAL MASS/SINUS;  Surgeon: Michael Boston, MD;  Location: [place];  Service: General;  Laterality: N/A;  . MOHS SURGERY     back  . PERINEAL HIDRADENITIS EXCISION  1998, 1999  . TRANSURETHRAL RESECTION OF BLADDER TUMOR  05/21/2012   Procedure: TRANSURETHRAL RESECTION OF BLADDER TUMOR (TURBT);  Surgeon: Claybon Jabs, MD;  Location: [facility];  Service: Urology;  Laterality:  N/A;       Social History   Socioeconomic History  . Marital status: Married    Spouse name: Not on file  . Number of children: Not on file  . Years of education: Not on file  . Highest education level: Not on file  Occupational History  . Occupation: Disabled  Social Needs  . Financial resource strain: Not on file  . Food insecurity:    Worry: Not on file    Inability: Not on file  . Transportation needs:    Medical: Not on file    Non-medical: Not on file  Tobacco Use  . Smoking status: Former Smoker    Packs/day: 1.00    Years: 41.00    Pack years: 41.00    Types: Cigars, Cigarettes  . Smokeless tobacco: Never Used  Substance and Sexual Activity  . Alcohol use: No    Alcohol/week: 0.0 standard drinks  . Drug use: No  . Sexual activity: Not on file  Lifestyle  . Physical activity:    Days per week: Not on file    Minutes per session: Not on file  . Stress: Not on file  Relationships  . Social connections:    Talks on phone: Not on file    Gets together: Not on file    Attends religious service: Not on file    Active member of club or organization: Not on file    Attends meetings of clubs or organizations: Not on file    Relationship status: Not on file  . Intimate partner violence:    Fear of current or ex partner: Not on file    Emotionally abused: Not on file    Physically abused: Not on file    Forced sexual activity: Not on file  Other Topics Concern  . Not on file  Social History Narrative  . Not on file    Current Outpatient Medications on File Prior to Visit  Medication Sig Dispense Refill  . amLODipine (NORVASC) 2.5 MG tablet Take 1 tablet (2.5 mg total) by mouth daily. 90 tablet 3  . aspirin EC 81 MG tablet Take 1 tablet (81 mg total) by mouth daily.    Marland Kitchen atorvastatin (LIPITOR) 20 MG tablet TAKE 1 TABLET BY MOUTH  DAILY 90 tablet 1  . insulin NPH Human (NOVOLIN N) 100 UNIT/ML injection 190 units each morning, and 30 units each evening, and  syringes 1/day. 210 mL 3  . losartan-hydrochlorothiazide (HYZAAR) 100-25 MG tablet Take 1 tablet by mouth daily. 90 tablet 3  . metFORMIN (GLUCOPHAGE-XR) 500 MG 24 hr tablet Take 1 tablet (500 mg total) by mouth daily with breakfast. 90 tablet 3   No current facility-administered medications on file prior to visit.     No Known Allergies  Family History  Problem Relation Age of Onset  . Hypertension Mother   . Cancer Father        lung ca  . Diabetes Maternal  Aunt        x 2  . Colon cancer Neg Hx   . Esophageal cancer Neg Hx   . Pancreatic cancer Neg Hx   . Prostate cancer Neg Hx   . Kidney disease Neg Hx   . Liver disease Neg Hx   . Lung cancer Neg Hx     BP (!) 178/88 (BP Location: Left Arm, Patient Position: Sitting, Cuff Size: Normal)   Pulse 85   Ht 5\' 8"  (1.727 m)   Wt 255 lb 12.8 oz (116 kg)   SpO2 95%   BMI 38.89 kg/m   Review of Systems Denies fever and dysuria. He denies hypoglycemia.      Objective:   Physical Exam VITAL SIGNS:  See vs page GENERAL: no distress ABDOMEN: abdomen is soft, nontender.  no hepatosplenomegaly.  not distended.  no hernia.   Ext: 1+ bilat leg edema.     Lab Results  Component Value Date   HGBA1C 7.8 (A) 03/25/2018   Lab Results  Component Value Date   CREATININE 1.10 01/08/2017   BUN 14 01/08/2017   NA 137 01/08/2017   K 3.9 01/08/2017   CL 99 01/08/2017   CO2 30 01/08/2017       Assessment & Plan:  Insulin-requiring type 2 DM, with PAD: this is the best control this pt should aim for, given this regimen, which does match insulin to his changing needs throughout the day.   Chronic abd pain: we discussed.  I told pt I can't come up with any other rx options.  HTN: Please continue the same medication.  Recheck next time.   Patient Instructions  Please continue the same insulin. Please continue the same medication for the blood pressure, for now.   check your blood sugar twice a day.  vary the time of day when you  check, between before the 3 meals, and at bedtime.  also check if you have symptoms of your blood sugar being too high or too low.  please keep a record of the readings and bring it to your next appointment here (or you can bring the meter itself).  You can write it on any piece of paper.  please call us sooner if your blood sugar goes below 70, or if you have a lot of readings over 200.   Please come back for a follow-up appointment in 2 months.

## 2018-03-25 NOTE — Patient Instructions (Addendum)
Please continue the same insulin. Please continue the same medication for the blood pressure, for now.   check your blood sugar twice a day.  vary the time of day when you check, between before the 3 meals, and at bedtime.  also check if you have symptoms of your blood sugar being too high or too low.  please keep a record of the readings and bring it to your next appointment here (or you can bring the meter itself).  You can write it on any piece of paper.  please call us sooner if your blood sugar goes below 70, or if you have a lot of readings over 200.   Please come back for a follow-up appointment in 2 months.

## 2018-03-29 ENCOUNTER — Other Ambulatory Visit: Payer: Self-pay

## 2018-03-29 ENCOUNTER — Ambulatory Visit (INDEPENDENT_AMBULATORY_CARE_PROVIDER_SITE_OTHER): Payer: Medicare Other

## 2018-03-29 ENCOUNTER — Other Ambulatory Visit (INDEPENDENT_AMBULATORY_CARE_PROVIDER_SITE_OTHER): Payer: Medicare Other

## 2018-03-29 DIAGNOSIS — R0602 Shortness of breath: Secondary | ICD-10-CM

## 2018-03-29 DIAGNOSIS — I10 Essential (primary) hypertension: Secondary | ICD-10-CM | POA: Diagnosis not present

## 2018-03-29 DIAGNOSIS — E785 Hyperlipidemia, unspecified: Secondary | ICD-10-CM

## 2018-03-29 DIAGNOSIS — R079 Chest pain, unspecified: Secondary | ICD-10-CM

## 2018-03-29 DIAGNOSIS — R0600 Dyspnea, unspecified: Secondary | ICD-10-CM | POA: Diagnosis not present

## 2018-03-30 LAB — BASIC METABOLIC PANEL
BUN / CREAT RATIO: 13 (ref 10–24)
BUN: 16 mg/dL (ref 8–27)
CO2: 26 mmol/L (ref 20–29)
CREATININE: 1.22 mg/dL (ref 0.76–1.27)
Calcium: 9.5 mg/dL (ref 8.6–10.2)
Chloride: 100 mmol/L (ref 96–106)
GFR calc Af Amer: 74 mL/min/{1.73_m2} (ref >59)
GFR calc non Af Amer: 64 mL/min/{1.73_m2} (ref >59)
GLUCOSE: 120 mg/dL — AB (ref 65–99)
Potassium: 3.3 mmol/L — ABNORMAL LOW (ref 3.5–5.2)
Sodium: 142 mmol/L (ref 134–144)

## 2018-03-30 LAB — HEPATIC FUNCTION PANEL
ALT: 44 IU/L (ref 0–44)
AST: 23 IU/L (ref 0–40)
Albumin: 4.1 g/dL (ref 3.6–4.8)
Alkaline Phosphatase: 83 IU/L (ref 39–117)
Bilirubin Total: 0.4 mg/dL (ref 0.0–1.2)
Bilirubin, Direct: 0.15 mg/dL (ref 0.00–0.40)
Total Protein: 7 g/dL (ref 6.0–8.5)

## 2018-03-30 LAB — LIPID PANEL
CHOLESTEROL TOTAL: 123 mg/dL (ref 100–199)
Chol/HDL Ratio: 4.4 ratio (ref 0.0–5.0)
HDL: 28 mg/dL — ABNORMAL LOW (ref >39)
LDL CALC: 34 mg/dL (ref 0–99)
TRIGLYCERIDES: 303 mg/dL — AB (ref 0–149)
VLDL CHOLESTEROL CAL: 61 mg/dL — AB (ref 5–40)

## 2018-04-06 ENCOUNTER — Ambulatory Visit: Payer: Self-pay

## 2018-04-06 ENCOUNTER — Telehealth: Payer: Self-pay | Admitting: *Deleted

## 2018-04-06 MED ORDER — POTASSIUM CHLORIDE ER 20 MEQ PO TBCR: 20.0000 meq | EXTENDED_RELEASE_TABLET | Freq: Every day | ORAL | 6 refills | Status: DC

## 2018-04-06 NOTE — Telephone Encounter (Signed)
Provided lab result per,  Wellington Hampshire,  MD on 8/23/19Patient voiced understanding.   Patient awaits notificaton from pharmacy to notify him that Rx is ready.

## 2018-04-06 NOTE — Telephone Encounter (Signed)
  Reason for Disposition . General information question, no triage required and triager able to answer question  Answer Assessment - Initial Assessment Questions 1. REASON FOR CALL or QUESTION: "What is your reason for calling today?" or "How can I best help you?" or "What question do you have that I can help answer?"     Patient inquiring about labs  Protocols used: INFORMATION ONLY CALL-A-AH

## 2018-04-06 NOTE — Telephone Encounter (Signed)
Patient made aware of results and verbalized understanding. K Dur 20 mEq has been sent into [facility]. Patient verbalized his understanding.

## 2018-04-06 NOTE — Telephone Encounter (Signed)
-----   Message from Wellington Hampshire, MD sent at 04/02/2018  4:19 PM EDT ----- Inform patient that labs showed mild hypokalemia.  Add K. Dur 20 mEq once daily.

## 2018-04-08 DIAGNOSIS — D2239 Melanocytic nevi of other parts of face: Secondary | ICD-10-CM | POA: Diagnosis not present

## 2018-04-08 DIAGNOSIS — L821 Other seborrheic keratosis: Secondary | ICD-10-CM | POA: Diagnosis not present

## 2018-04-08 DIAGNOSIS — L820 Inflamed seborrheic keratosis: Secondary | ICD-10-CM | POA: Diagnosis not present

## 2018-04-08 DIAGNOSIS — L300 Nummular dermatitis: Secondary | ICD-10-CM | POA: Diagnosis not present

## 2018-04-08 DIAGNOSIS — D225 Melanocytic nevi of trunk: Secondary | ICD-10-CM | POA: Diagnosis not present

## 2018-05-27 DIAGNOSIS — Z8551 Personal history of malignant neoplasm of bladder: Secondary | ICD-10-CM | POA: Diagnosis not present

## 2018-06-07 ENCOUNTER — Encounter: Payer: Self-pay | Admitting: Endocrinology

## 2018-06-07 ENCOUNTER — Ambulatory Visit (INDEPENDENT_AMBULATORY_CARE_PROVIDER_SITE_OTHER): Payer: Medicare Other | Admitting: Endocrinology

## 2018-06-07 VITALS — BP 162/82 | HR 95 | Ht 68.0 in | Wt 254.0 lb

## 2018-06-07 DIAGNOSIS — Z794 Long term (current) use of insulin: Secondary | ICD-10-CM

## 2018-06-07 DIAGNOSIS — E1151 Type 2 diabetes mellitus with diabetic peripheral angiopathy without gangrene: Secondary | ICD-10-CM

## 2018-06-07 LAB — POCT GLYCOSYLATED HEMOGLOBIN (HGB A1C): HEMOGLOBIN A1C: 8.5 % — AB (ref 4.0–5.6)

## 2018-06-07 MED ORDER — INSULIN NPH (HUMAN) (ISOPHANE) 100 UNIT/ML ~~LOC~~ SUSP: SUBCUTANEOUS | 3 refills | Status: DC

## 2018-06-07 NOTE — Progress Notes (Signed)
poct

## 2018-06-07 NOTE — Patient Instructions (Addendum)
Please increase the morning insulin to 200 units.  Please continue the same evening insulin.   Your blood pressure is high today.  Please see your primary care provider soon, to have it rechecked.   check your blood sugar twice a day.  vary the time of day when you check, between before the 3 meals, and at bedtime.  also check if you have symptoms of your blood sugar being too high or too low.  please keep a record of the readings and bring it to your next appointment here (or you can bring the meter itself).  You can write it on any piece of paper.  please call us sooner if your blood sugar goes below 70, or if you have a lot of readings over 200.   Please come back for a follow-up appointment in 3 months.

## 2018-06-07 NOTE — Progress Notes (Signed)
Subjective:    Patient ID: [name], male    DOB: 03-24-1956, 62 y.o.   MRN: [id number]  HPI Pt returns for f/u of diabetes mellitus: DM type: Insulin-requiring type 2.   Dx'ed: 4193 Complications: PAD, renal insuff, and polyneuropathy.  Therapy: insulin since 2009 DKA: never Severe hypoglycemia: never.  Pancreatitis: never.  Other: he takes a BID insulin regimen, after poor results with multiple daily injections; he takes human insulin, due to cost.   Interval history: Meter is downloaded today, and the printout is scanned into the record.  It varies from 98-300's. He checks fasting only.  No new sxs.  Pt says he never misses the insulin.   Past Medical History:  Diagnosis Date  . Allergic rhinitis   . Arthritis   . At risk for sleep apnea    STOP-BANG= 5   SENT TO PCP 03-14-2014  . Condyloma acuminatum of penis   . Diabetic neuropathy ([place])   . GERD (gastroesophageal reflux disease)   . History of bladder cancer    s/p  turbt  2013/   transitional cell carcinoma--   . History of condyloma acuminatum    PERINEAL AREA  W/ RECURRENCY  . History of gout   . Hyperlipidemia   . Hypertension   . Lower urinary tract symptoms (LUTS)   . Productive cough   . PVD (peripheral vascular disease) with claudication (HCC)    bilateral SFA disease-- right > left  and left tibial artery disease--  per duplex  . Smokers' cough ([place])   . Type 2 diabetes mellitus with insulin therapy ([place])    monitor by  dr ellsion  . Wears dentures    upper    Past Surgical History:  Procedure Laterality Date  . AXILLARY HIDRADENITIS EXCISION  1997  . CARDIOVASCULAR STRESS TEST  07-24-2014  dr Kathlyn [place]   Low risk lexiscan nuclear study with apical thinning and small inferolateral wall infarct at mid & basal level , no ischemia/  normal LVF and wall motion , ef 59%  . CATARACT EXTRACTION Left   . CATARACT EXTRACTION W/ INTRAOCULAR LENS IMPLANT Right   . CO2 LASER APPLICATION N/A 7/90/2409     Procedure: CO2 LASER APPLICATION,PENIS, GROIN, ANUS;  Surgeon: [name], MD;  Location: [place];  Service: General;  Laterality: N/A;  . CO2 LASER APPLICATION N/A 73/53/2992   Procedure: CO2 LASER APPLICATION;  Surgeon: Kathie Rhodes, MD;  Location: [facility];  Service: Urology;  Laterality: N/A;  . CONDYLOMA EXCISION/FULGURATION N/A 05/21/2015   Procedure: CONDYLOMA REMOVAL;  Surgeon: Kathie Rhodes, MD;  Location: [facility];  Service: Urology;  Laterality: N/A;  . HEMORRHOID SURGERY  10/24/2014   Procedure: HEMORRHOIDECTOMY;  Surgeon: Michael Boston, MD;  Location: [facility];  Service: General;;  . INCISION AND DRAINAGE ABSCESS Left 10/24/2014   Procedure: INCISION AND DRAINAGE ABSCESS;  Surgeon: Michael Boston, MD;  Location: [place];  Service: General;  Laterality: Left;  . INGUINAL HIDRADENITIS EXCISION  1998, 1999  . LASER ABLATION CONDOLAMATA N/A 03/20/2014   Procedure: EXAM UNDER ANESTHESIA, REMOVAL/ABLATION OF CONDYLOMATA PENIS,GROINS, ANUS, ANAL CANAL;  Surgeon: [name], MD;  Location: [place];  Service: General;  Laterality: N/A;  groin and anus  . LASER ABLATION CONDOLAMATA N/A 10/24/2014   Procedure: LASER ABLATION CONDOLAMATA;  Surgeon: Michael Boston, MD;  Location: [facility];  Service: General;  Laterality: N/A;  .  LASER ABLATION OF PENILE AND PERIANAL WARTS  07-29-2007  Dr. Johney Maine  . LEFT SHOULDER SURGERY  2003  . MASS EXCISION N/A 10/24/2014   Procedure: EXCISION OF PERINEAL MASS/SINUS;  Surgeon: Michael Boston, MD;  Location: [place];  Service: General;  Laterality: N/A;  . MOHS SURGERY     back  . PERINEAL HIDRADENITIS EXCISION  1998, 1999  . TRANSURETHRAL RESECTION OF BLADDER TUMOR  05/21/2012   Procedure: TRANSURETHRAL RESECTION OF BLADDER TUMOR (TURBT);  Surgeon: Claybon Jabs, MD;  Location: [place];   Service: Urology;  Laterality: N/A;       Social History   Socioeconomic History  . Marital status: Married    Spouse name: Not on file  . Number of children: Not on file  . Years of education: Not on file  . Highest education level: Not on file  Occupational History  . Occupation: Disabled  Social Needs  . Financial resource strain: Not on file  . Food insecurity:    Worry: Not on file    Inability: Not on file  . Transportation needs:    Medical: Not on file    Non-medical: Not on file  Tobacco Use  . Smoking status: Former Smoker    Packs/day: 1.00    Years: 41.00    Pack years: 41.00    Types: Cigars, Cigarettes  . Smokeless tobacco: Never Used  Substance and Sexual Activity  . Alcohol use: No    Alcohol/week: 0.0 standard drinks  . Drug use: No  . Sexual activity: Not on file  Lifestyle  . Physical activity:    Days per week: Not on file    Minutes per session: Not on file  . Stress: Not on file  Relationships  . Social connections:    Talks on phone: Not on file    Gets together: Not on file    Attends religious service: Not on file    Active member of club or organization: Not on file    Attends meetings of clubs or organizations: Not on file    Relationship status: Not on file  . Intimate partner violence:    Fear of current or ex partner: Not on file    Emotionally abused: Not on file    Physically abused: Not on file    Forced sexual activity: Not on file  Other Topics Concern  . Not on file  Social History Narrative  . Not on file    Current Outpatient Medications on File Prior to Visit  Medication Sig Dispense Refill  . amLODipine (NORVASC) 2.5 MG tablet Take 1 tablet (2.5 mg total) by mouth daily. 90 tablet 3  . aspirin EC 81 MG tablet Take 1 tablet (81 mg total) by mouth daily.    Marland Kitchen atorvastatin (LIPITOR) 20 MG tablet TAKE 1 TABLET BY MOUTH  DAILY 90 tablet 1  . losartan-hydrochlorothiazide (HYZAAR) 100-25 MG tablet Take 1 tablet by mouth  daily. 90 tablet 3  . metFORMIN (GLUCOPHAGE-XR) 500 MG 24 hr tablet Take 1 tablet (500 mg total) by mouth daily with breakfast. 90 tablet 3  . potassium chloride 20 MEQ TBCR Take 20 mEq by mouth daily. 30 tablet 6   No current facility-administered medications on file prior to visit.     No Known Allergies  Family History  Problem Relation Age of Onset  . Hypertension Mother   . Cancer Father        lung ca  . Diabetes  Maternal Aunt        x 2  . Colon cancer Neg Hx   . Esophageal cancer Neg Hx   . Pancreatic cancer Neg Hx   . Prostate cancer Neg Hx   . Kidney disease Neg Hx   . Liver disease Neg Hx   . Lung cancer Neg Hx     BP (!) 162/82   Pulse 95   Ht 5\' 8"  (1.727 m)   Wt 254 lb (115.2 kg)   SpO2 95%   BMI 38.62 kg/m   Review of Systems He denies hypoglycemia.     Objective:   Physical Exam VITAL SIGNS:  See vs page GENERAL: no distress Pulses: dorsalis pedis intact bilat.   MSK: no deformity of the feet CV: no leg edema, but there are bilat vv's. Skin: no ulcer on the feet.  normal color and temp on the feet. Neuro: sensation is intact to touch on the feet.  Ext: There is bilateral onychomycosis of the toenails.   Lab Results  Component Value Date   HGBA1C 8.5 (A) 06/07/2018   Lab Results  Component Value Date   CREATININE 1.22 03/29/2018   BUN 16 03/29/2018   NA 142 03/29/2018   K 3.3 (L) 03/29/2018   CL 100 03/29/2018   CO2 26 03/29/2018       Assessment & Plan:  Insulin-requiring type 2 DM, with PAD: worse Renal insuff: he needs more am than pm insulin  Patient Instructions  Please increase the morning insulin to 200 units.  Please continue the same evening insulin.   Your blood pressure is high today.  Please see your primary care provider soon, to have it rechecked.   check your blood sugar twice a day.  vary the time of day when you check, between before the 3 meals, and at bedtime.  also check if you have symptoms of your blood sugar  being too high or too low.  please keep a record of the readings and bring it to your next appointment here (or you can bring the meter itself).  You can write it on any piece of paper.  please call us sooner if your blood sugar goes below 70, or if you have a lot of readings over 200.   Please come back for a follow-up appointment in 3 months.

## 2018-06-10 ENCOUNTER — Telehealth: Payer: Self-pay | Admitting: Internal Medicine

## 2018-06-10 ENCOUNTER — Encounter: Payer: Medicare Other | Admitting: Internal Medicine

## 2018-06-10 NOTE — Telephone Encounter (Signed)
LVM to inform patient that Dr.John would accept him. But I did see he had set up a New patient appointment somewhere else. So he just needs to pick if he is going to come here or not.   If he makes an appointment OV is the type and in the comment NEW PATIENT/INSURANCE

## 2018-06-10 NOTE — Telephone Encounter (Signed)
Copied from [place]. Topic: General - Other >> Jun 10, 2018  8:20 AM Carolyn Stare wrote:  Pt was seeing Dr Loanne Drilling and is asking if Dr Jenny Reichmann will except him [facility]   pt wife Marcie Bal is a pt of Dr Jenny Reichmann  Dr.John okay with you?

## 2018-06-10 NOTE — Telephone Encounter (Signed)
Ok with me 

## 2018-06-14 NOTE — Telephone Encounter (Signed)
Appointment has been made for 11/5 with Dr.John.  Tammy can you change the OV to NP? Thank you.

## 2018-06-15 ENCOUNTER — Encounter: Payer: Self-pay | Admitting: Internal Medicine

## 2018-06-15 ENCOUNTER — Other Ambulatory Visit (INDEPENDENT_AMBULATORY_CARE_PROVIDER_SITE_OTHER): Payer: Medicare Other

## 2018-06-15 ENCOUNTER — Ambulatory Visit (INDEPENDENT_AMBULATORY_CARE_PROVIDER_SITE_OTHER): Payer: Medicare Other | Admitting: Internal Medicine

## 2018-06-15 VITALS — BP 138/86 | HR 84 | Temp 97.6°F | Ht 68.0 in | Wt 257.0 lb

## 2018-06-15 DIAGNOSIS — Z794 Long term (current) use of insulin: Secondary | ICD-10-CM

## 2018-06-15 DIAGNOSIS — E1151 Type 2 diabetes mellitus with diabetic peripheral angiopathy without gangrene: Secondary | ICD-10-CM | POA: Diagnosis not present

## 2018-06-15 DIAGNOSIS — Z0000 Encounter for general adult medical examination without abnormal findings: Secondary | ICD-10-CM | POA: Diagnosis not present

## 2018-06-15 DIAGNOSIS — Z23 Encounter for immunization: Secondary | ICD-10-CM | POA: Diagnosis not present

## 2018-06-15 LAB — LIPID PANEL
Cholesterol: 120 mg/dL (ref 0–200)
HDL: 30.8 mg/dL — ABNORMAL LOW (ref >39.00)
NonHDL: 89.25
Total CHOL/HDL Ratio: 4
Triglycerides: 256 mg/dL — ABNORMAL HIGH (ref 0.0–149.0)
VLDL: 51.2 mg/dL — ABNORMAL HIGH (ref 0.0–40.0)

## 2018-06-15 LAB — HEPATIC FUNCTION PANEL
ALK PHOS: 79 U/L (ref 39–117)
ALT: 50 U/L (ref 0–53)
AST: 35 U/L (ref 0–37)
Albumin: 4.3 g/dL (ref 3.5–5.2)
BILIRUBIN DIRECT: 0.1 mg/dL (ref 0.0–0.3)
BILIRUBIN TOTAL: 0.5 mg/dL (ref 0.2–1.2)
Total Protein: 7.4 g/dL (ref 6.0–8.3)

## 2018-06-15 LAB — CBC WITH DIFFERENTIAL/PLATELET
BASOS ABS: 0.1 10*3/uL (ref 0.0–0.1)
BASOS PCT: 1.2 % (ref 0.0–3.0)
EOS ABS: 0.5 10*3/uL (ref 0.0–0.7)
Eosinophils Relative: 4.4 % (ref 0.0–5.0)
HCT: 44.7 % (ref 39.0–52.0)
Hemoglobin: 15.8 g/dL (ref 13.0–17.0)
LYMPHS PCT: 28.4 % (ref 12.0–46.0)
Lymphs Abs: 3.1 10*3/uL (ref 0.7–4.0)
MCHC: 35.3 g/dL (ref 30.0–36.0)
MCV: 93.5 fl (ref 78.0–100.0)
MONO ABS: 0.7 10*3/uL (ref 0.1–1.0)
Monocytes Relative: 6.6 % (ref 3.0–12.0)
Neutro Abs: 6.6 10*3/uL (ref 1.4–7.7)
Neutrophils Relative %: 59.4 % (ref 43.0–77.0)
Platelets: 307 10*3/uL (ref 150.0–400.0)
RBC: 4.78 Mil/uL (ref 4.22–5.81)
RDW: 12.7 % (ref 11.5–15.5)
WBC: 11.1 10*3/uL — ABNORMAL HIGH (ref 4.0–10.5)

## 2018-06-15 LAB — URINALYSIS, ROUTINE W REFLEX MICROSCOPIC
Bilirubin Urine: NEGATIVE
HGB URINE DIPSTICK: NEGATIVE
Ketones, ur: NEGATIVE
Leukocytes, UA: NEGATIVE
NITRITE: NEGATIVE
RBC / HPF: NONE SEEN (ref 0–?)
Specific Gravity, Urine: 1.025 (ref 1.000–1.030)
URINE GLUCOSE: 100 — AB
Urobilinogen, UA: 0.2 (ref 0.0–1.0)
pH: 5 (ref 5.0–8.0)

## 2018-06-15 LAB — BASIC METABOLIC PANEL
BUN: 13 mg/dL (ref 6–23)
CALCIUM: 9.9 mg/dL (ref 8.4–10.5)
CHLORIDE: 100 meq/L (ref 96–112)
CO2: 31 mEq/L (ref 19–32)
Creatinine, Ser: 1.16 mg/dL (ref 0.40–1.50)
GFR: 67.88 mL/min (ref >60.00)
GLUCOSE: 121 mg/dL — AB (ref 70–99)
POTASSIUM: 3.8 meq/L (ref 3.5–5.1)
Sodium: 139 mEq/L (ref 135–145)

## 2018-06-15 LAB — LDL CHOLESTEROL, DIRECT: LDL DIRECT: 67 mg/dL

## 2018-06-15 LAB — TSH: TSH: 2.89 u[IU]/mL (ref 0.35–4.50)

## 2018-06-15 LAB — PSA: PSA: 1.73 ng/mL (ref 0.10–4.00)

## 2018-06-15 MED ORDER — PANTOPRAZOLE SODIUM 40 MG PO TBEC: 40.0000 mg | DELAYED_RELEASE_TABLET | Freq: Every day | ORAL | 3 refills | Status: DC

## 2018-06-15 NOTE — Patient Instructions (Addendum)
You had the flu shot today  Please take all new medication as prescribed - the protonix for stomach acid  You will be contacted regarding the referral for: colonoscopy  Please continue all other medications as before, and refills have been done if requested.  Please have the pharmacy call with any other refills you may need.  Please continue your efforts at being more active, low cholesterol diet, and weight control.  You are otherwise up to date with prevention measures today.  Please keep your appointments with your specialists as you may have planned  Please go to the LAB in the Basement (turn left off the elevator) for the tests to be done today  You will be contacted by phone if any changes need to be made immediately.  Otherwise, you will receive a letter about your results with an explanation, but please check with MyChart first.  Please remember to sign up for MyChart if you have not done so, as this will be important to you in the future with finding out test results, communicating by private email, and scheduling acute appointments online when needed.  Please return in 6 months, or sooner if needed

## 2018-06-15 NOTE — Assessment & Plan Note (Signed)
Recent a1c worse, now on increased insulin, f/u endo as planned

## 2018-06-15 NOTE — Progress Notes (Signed)
Subjective:    Patient ID: [name], male    DOB: May 18, 1956, 62 y.o.   MRN: [id number]  HPI  Here for wellness and f/u;  Overall doing ok;  Pt denies Chest pain, worsening SOB, DOE, wheezing, orthopnea, PND, worsening LE edema, palpitations, dizziness or syncope.  Pt denies neurological change such as new headache, facial or extremity weakness.  Pt denies polydipsia, polyuria, or low sugar symptoms. Pt states overall good compliance with treatment and medications, good tolerability, and has been trying to follow appropriate diet.  Pt denies worsening depressive symptoms, suicidal ideation or panic. No fever, night sweats, wt loss, loss of appetite, or other constitutional symptoms.  Pt states good ability with ADL's, has low fall risk, home safety reviewed and adequate, no other significant changes in hearing or vision, and only occasionally active with exercise  Sees yearly Dr Karsten Ro for f/u bladder cancer.  Also s/p left cataract recentl;y as well. Due for colonoscopy Past Medical History:  Diagnosis Date  . Allergic rhinitis   . Arthritis   . At risk for sleep apnea    STOP-BANG= 5   SENT TO PCP 03-14-2014  . Condyloma acuminatum of penis   . Diabetic neuropathy ([place])   . GERD (gastroesophageal reflux disease)   . History of bladder cancer    s/p  turbt  2013/   transitional cell carcinoma--   . History of condyloma acuminatum    PERINEAL AREA  W/ RECURRENCY  . History of gout   . Hyperlipidemia   . Hypertension   . Lower urinary tract symptoms (LUTS)   . Productive cough   . PVD (peripheral vascular disease) with claudication (HCC)    bilateral SFA disease-- right > left  and left tibial artery disease--  per duplex  . Smokers' cough ([place])   . Type 2 diabetes mellitus with insulin therapy ([place])    monitor by  dr ellsion  . Wears dentures    upper   Past Surgical History:  Procedure Laterality Date  . AXILLARY HIDRADENITIS EXCISION  1997  . CARDIOVASCULAR STRESS TEST   07-24-2014  dr Kathlyn [place]   Low risk lexiscan nuclear study with apical thinning and small inferolateral wall infarct at mid & basal level , no ischemia/  normal LVF and wall motion , ef 59%  . CATARACT EXTRACTION Left   . CATARACT EXTRACTION W/ INTRAOCULAR LENS IMPLANT Right   . CO2 LASER APPLICATION N/A 9/45/0388   Procedure: CO2 LASER APPLICATION,PENIS, GROIN, ANUS;  Surgeon: [name], MD;  Location: [place];  Service: General;  Laterality: N/A;  . CO2 LASER APPLICATION N/A 82/80/0349   Procedure: CO2 LASER APPLICATION;  Surgeon: Kathie Rhodes, MD;  Location: [facility];  Service: Urology;  Laterality: N/A;  . CONDYLOMA EXCISION/FULGURATION N/A 05/21/2015   Procedure: CONDYLOMA REMOVAL;  Surgeon: Kathie Rhodes, MD;  Location: [facility];  Service: Urology;  Laterality: N/A;  . HEMORRHOID SURGERY  10/24/2014   Procedure: HEMORRHOIDECTOMY;  Surgeon: Michael Boston, MD;  Location: [facility];  Service: General;;  . INCISION AND DRAINAGE ABSCESS Left 10/24/2014   Procedure: INCISION AND DRAINAGE ABSCESS;  Surgeon: Michael Boston, MD;  Location: [place];  Service: General;  Laterality: Left;  . INGUINAL HIDRADENITIS EXCISION  1998, 1999  . LASER ABLATION CONDOLAMATA N/A 03/20/2014   Procedure: EXAM UNDER ANESTHESIA, REMOVAL/ABLATION OF CONDYLOMATA PENIS,GROINS, ANUS, ANAL CANAL;  Surgeon: [name], MD;  Location: [facility];  Service: General;  Laterality: N/A;  groin and anus  . LASER ABLATION CONDOLAMATA N/A 10/24/2014   Procedure: LASER ABLATION CONDOLAMATA;  Surgeon: Michael Boston, MD;  Location: [facility];  Service: General;  Laterality: N/A;  . LASER ABLATION OF PENILE AND PERIANAL WARTS  07-29-2007  Dr. Johney Maine  . LEFT SHOULDER SURGERY  2003  . MASS EXCISION N/A 10/24/2014   Procedure: EXCISION OF PERINEAL MASS/SINUS;  Surgeon: Michael Boston, MD;  Location: [place];  Service: General;  Laterality: N/A;  . MOHS SURGERY     back  . PERINEAL HIDRADENITIS EXCISION  1998, 1999  . TRANSURETHRAL RESECTION OF BLADDER TUMOR  05/21/2012   Procedure: TRANSURETHRAL RESECTION OF BLADDER TUMOR (TURBT);  Surgeon: Claybon Jabs, MD;  Location: [facility];  Service: Urology;  Laterality: N/A;       reports that he has quit smoking. His smoking use included cigars and cigarettes. He has a 41.00 pack-year smoking history. He has never used smokeless tobacco. He reports that he does not drink alcohol or use drugs. family history includes Cancer in his father; Diabetes in his maternal aunt; Hypertension in his mother. No Known Allergies Current Outpatient Medications on File Prior to Visit  Medication Sig Dispense Refill  . amLODipine (NORVASC) 2.5 MG tablet Take 1 tablet (2.5 mg total) by mouth daily. 90 tablet 3  . aspirin EC 81 MG tablet Take 1 tablet (81 mg total) by mouth daily.    Marland Kitchen atorvastatin (LIPITOR) 20 MG tablet TAKE 1 TABLET BY MOUTH  DAILY 90 tablet 1  . insulin NPH Human (NOVOLIN N) 100 UNIT/ML injection 200 units each morning, and 30 units each evening, and syringes 2/day. 210 mL 3  . losartan-hydrochlorothiazide (HYZAAR) 100-25 MG tablet Take 1 tablet by mouth daily. 90 tablet 3  . metFORMIN (GLUCOPHAGE-XR) 500 MG 24 hr tablet Take 1 tablet (500 mg total) by mouth daily with breakfast. 90 tablet 3  . potassium chloride 20 MEQ TBCR Take 20 mEq by mouth daily. 30 tablet 6   No current facility-administered medications on file prior to visit.    Review of Systems Constitutional: Negative for other unusual diaphoresis, sweats, appetite or weight changes HENT: Negative for other worsening hearing loss, ear pain, facial swelling, mouth sores or neck stiffness.   Eyes: Negative for other worsening pain, redness or other visual disturbance.  Respiratory: Negative for other stridor or swelling Cardiovascular: Negative for other  palpitations or other chest pain  Gastrointestinal: Negative for worsening diarrhea or loose stools, blood in stool, distention or other pain Genitourinary: Negative for hematuria, flank pain or other change in urine volume.  Musculoskeletal: Negative for myalgias or other joint swelling.  Skin: Negative for other color change, or other wound or worsening drainage.  Neurological: Negative for other syncope or numbness. Hematological: Negative for other adenopathy or swelling Psychiatric/Behavioral: Negative for hallucinations, other worsening agitation, SI, self-injury, or new decreased concentration ALl other system neg per pt    Objective:   Physical Exam BP 138/86   Pulse 84   Temp 97.6 F (36.4 C) (Oral)   Ht 5\' 8"  (1.727 m)   Wt 257 lb (116.6 kg)   SpO2 96%   BMI 39.08 kg/m  VS noted,  Constitutional: Pt is oriented to person, place, and time. Appears well-developed and well-nourished, in no significant distress and comfortable Head: Normocephalic and atraumatic  Eyes: Conjunctivae and EOM are normal. Pupils are equal, round, and reactive  to light Right Ear: External ear normal without discharge Left Ear: External ear normal without discharge Nose: Nose without discharge or deformity Mouth/Throat: Oropharynx is without other ulcerations and moist  Neck: Normal range of motion. Neck supple. No JVD present. No tracheal deviation present or significant neck LA or mass Cardiovascular: Normal rate, regular rhythm, normal heart sounds and intact distal pulses.   Pulmonary/Chest: WOB normal and breath sounds without rales or wheezing  Abdominal: Soft. Bowel sounds are normal. NT. No HSM  Musculoskeletal: Normal range of motion. Exhibits no edema Lymphadenopathy: Has no other cervical adenopathy.  Neurological: Pt is alert and oriented to person, place, and time. Pt has normal reflexes. No cranial nerve deficit. Motor grossly intact, Gait intact Skin: Skin is warm and dry. No rash  noted or new ulcerations Psychiatric:  Has normal mood and affect. Behavior is normal without agitation No other exam findings Lab Results  Component Value Date   WBC 11.1 (H) 06/15/2018   HGB 15.8 06/15/2018   HCT 44.7 06/15/2018   PLT 307.0 06/15/2018   GLUCOSE 121 (H) 06/15/2018   CHOL 120 06/15/2018   TRIG 256.0 (H) 06/15/2018   HDL 30.80 (L) 06/15/2018   LDLDIRECT 67.0 06/15/2018   LDLCALC 34 03/29/2018   ALT 50 06/15/2018   AST 35 06/15/2018   NA 139 06/15/2018   K 3.8 06/15/2018   CL 100 06/15/2018   CREATININE 1.16 06/15/2018   BUN 13 06/15/2018   CO2 31 06/15/2018   TSH 2.89 06/15/2018   PSA 1.73 06/15/2018   HGBA1C 8.5 (A) 06/07/2018   MICROALBUR 5.6 (H) 01/08/2017      Assessment & Plan:

## 2018-06-16 ENCOUNTER — Encounter: Payer: Self-pay | Admitting: Internal Medicine

## 2018-06-18 NOTE — Assessment & Plan Note (Signed)

## 2018-06-30 DIAGNOSIS — E109 Type 1 diabetes mellitus without complications: Secondary | ICD-10-CM | POA: Diagnosis not present

## 2018-07-01 ENCOUNTER — Ambulatory Visit (AMBULATORY_SURGERY_CENTER): Payer: Self-pay | Admitting: *Deleted

## 2018-07-01 ENCOUNTER — Other Ambulatory Visit: Payer: Self-pay

## 2018-07-01 VITALS — Ht 68.0 in | Wt 262.4 lb

## 2018-07-01 DIAGNOSIS — Z86010 Personal history of colonic polyps: Secondary | ICD-10-CM

## 2018-07-01 MED ORDER — SUPREP BOWEL PREP KIT 17.5-3.13-1.6 GM/177ML PO SOLN: 1.0000 | Freq: Once | ORAL | 0 refills | Status: AC

## 2018-07-01 NOTE — Progress Notes (Signed)
Patient denies any allergies to egg or soy products. Patient denies complications with anesthesia/sedation.  Patient denies oxygen use at home and denies diet medications. Patient denies information on colonoscopy. 

## 2018-07-02 ENCOUNTER — Telehealth: Payer: Self-pay

## 2018-07-02 NOTE — Telephone Encounter (Signed)
[facility] orders have been faxed successfully to [phone]. Documents and fax confirmation have been placed in the faxed file for future reference.

## 2018-07-05 ENCOUNTER — Ambulatory Visit: Payer: Medicare Other | Admitting: Family Medicine

## 2018-07-06 ENCOUNTER — Encounter: Payer: Self-pay | Admitting: Internal Medicine

## 2018-07-14 ENCOUNTER — Telehealth: Payer: Self-pay | Admitting: Internal Medicine

## 2018-07-14 MED ORDER — NA SULFATE-K SULFATE-MG SULF 17.5-3.13-1.6 GM/177ML PO SOLN: 1.0000 | Freq: Once | ORAL | 0 refills | Status: AC

## 2018-07-14 NOTE — Telephone Encounter (Signed)
Sent Suprep to [facility]

## 2018-07-16 ENCOUNTER — Encounter: Payer: Self-pay | Admitting: *Deleted

## 2018-07-16 NOTE — Telephone Encounter (Signed)
Started in error

## 2018-07-19 ENCOUNTER — Other Ambulatory Visit: Payer: Self-pay | Admitting: Endocrinology

## 2018-07-22 ENCOUNTER — Encounter: Payer: Self-pay | Admitting: Internal Medicine

## 2018-07-22 ENCOUNTER — Ambulatory Visit (AMBULATORY_SURGERY_CENTER): Payer: Medicare Other | Admitting: Internal Medicine

## 2018-07-22 VITALS — BP 132/58 | HR 58 | Temp 98.7°F | Resp 17 | Ht 68.0 in | Wt 262.0 lb

## 2018-07-22 DIAGNOSIS — K219 Gastro-esophageal reflux disease without esophagitis: Secondary | ICD-10-CM | POA: Diagnosis not present

## 2018-07-22 DIAGNOSIS — K635 Polyp of colon: Secondary | ICD-10-CM

## 2018-07-22 DIAGNOSIS — D124 Benign neoplasm of descending colon: Secondary | ICD-10-CM

## 2018-07-22 DIAGNOSIS — D125 Benign neoplasm of sigmoid colon: Secondary | ICD-10-CM | POA: Diagnosis not present

## 2018-07-22 DIAGNOSIS — Z86010 Personal history of colonic polyps: Secondary | ICD-10-CM | POA: Diagnosis not present

## 2018-07-22 DIAGNOSIS — D122 Benign neoplasm of ascending colon: Secondary | ICD-10-CM | POA: Diagnosis not present

## 2018-07-22 DIAGNOSIS — D123 Benign neoplasm of transverse colon: Secondary | ICD-10-CM | POA: Diagnosis not present

## 2018-07-22 MED ORDER — SODIUM CHLORIDE 0.9 % IV SOLN: 500.0000 mL | Freq: Once | INTRAVENOUS | Status: DC

## 2018-07-22 NOTE — Progress Notes (Signed)
Pt's states no medical or surgical changes since previsit or office visit. 

## 2018-07-22 NOTE — Progress Notes (Signed)
Report to PACU, RN, vss, BBS= Clear.  

## 2018-07-22 NOTE — Op Note (Signed)
[facility] Patient Name: [name] Procedure Date: 07/22/2018 12:55 PM MRN: [id number] Endoscopist: Docia Chuck. [name] , MD Age: 62 Referring MD:  Date of Birth: 20-Mar-1956 Gender: Male Account #: [id number] Procedure:                Colonoscopy with cold snare polypectomy x 8 Indications:              High risk colon cancer surveillance: Personal                            history of adenoma (10 mm or greater in size), High                            risk colon cancer surveillance: Personal history of                            multiple (3 or more) adenomas. Previous                            examinations with Dr. Deatra Ina 2007, 2011, 2016 Medicines:                Monitored Anesthesia Care Procedure:                Pre-Anesthesia Assessment:                           - Prior to the procedure, a History and Physical                            was performed, and patient medications and                            allergies were reviewed. The patient's tolerance of                            previous anesthesia was also reviewed. The risks                            and benefits of the procedure and the sedation                            options and risks were discussed with the patient.                            All questions were answered, and informed consent                            was obtained. Prior Anticoagulants: The patient has                            taken no previous anticoagulant or antiplatelet                            agents. ASA Grade Assessment: II - A patient with  mild systemic disease. After reviewing the risks                            and benefits, the patient was deemed in                            satisfactory condition to undergo the procedure.                           After obtaining informed consent, the colonoscope                            was passed under direct vision. Throughout the   procedure, the patient's blood pressure, pulse, and                            oxygen saturations were monitored continuously. The                            Colonoscope was introduced through the anus and                            advanced to the the cecum, identified by                            appendiceal orifice and ileocecal valve. The                            ileocecal valve, appendiceal orifice, and rectum                            were photographed. The quality of the bowel                            preparation was excellent. The colonoscopy was                            performed without difficulty. The patient tolerated                            the procedure well. The bowel preparation used was                            SUPREP. Scope In: 1:49:06 PM Scope Out: 2:09:05 PM Scope Withdrawal Time: 0 hours 18 minutes 15 seconds  Total Procedure Duration: 0 hours 19 minutes 59 seconds  Findings:                 Eight polyps were found in the sigmoid colon,                            descending colon, transverse colon and ascending                            colon. The polyps were 3 to 8 mm  in size. These                            polyps were removed with a cold snare. Resection                            and retrieval were complete.                           The exam was otherwise without abnormality on                            direct and retroflexion views. Complications:            No immediate complications. Estimated blood loss:                            None. Estimated Blood Loss:     Estimated blood loss: none. Impression:               - Eight 3 to 8 mm polyps in the sigmoid colon, in                            the descending colon, in the transverse colon and                            in the ascending colon, removed with a cold snare.                            Resected and retrieved.                           - The examination was otherwise normal on direct                             and retroflexion views. Recommendation:           - Repeat colonoscopy in 3 years for surveillance.                           - Patient has a contact number available for                            emergencies. The signs and symptoms of potential                            delayed complications were discussed with the                            patient. Return to normal activities tomorrow.                            Written discharge instructions were provided to the                            patient.                           -  Resume previous diet.                           - Continue present medications.                           - Await pathology results. Docia Chuck. [name], MD 07/22/2018 2:15:15 PM This report has been signed electronically.

## 2018-07-22 NOTE — Patient Instructions (Signed)
Await pathology results. Continue present medications. Please read handout on polyps.     YOU HAD AN ENDOSCOPIC PROCEDURE TODAY AT THE [facility]:   Refer to the procedure report that was given to you for any specific questions about what was found during the examination.  If the procedure report does not answer your questions, please call your gastroenterologist to clarify.  If you requested that your care partner not be given the details of your procedure findings, then the procedure report has been included in a sealed envelope for you to review at your convenience later.  YOU SHOULD EXPECT: Some feelings of bloating in the abdomen. Passage of more gas than usual.  Walking can help get rid of the air that was put into your GI tract during the procedure and reduce the bloating. If you had a lower endoscopy (such as a colonoscopy or flexible sigmoidoscopy) you may notice spotting of blood in your stool or on the toilet paper. If you underwent a bowel prep for your procedure, you may not have a normal bowel movement for a few days.  Please Note:  You might notice some irritation and congestion in your nose or some drainage.  This is from the oxygen used during your procedure.  There is no need for concern and it should clear up in a day or so.  SYMPTOMS TO REPORT IMMEDIATELY:   Following lower endoscopy (colonoscopy or flexible sigmoidoscopy):  Excessive amounts of blood in the stool  Significant tenderness or worsening of abdominal pains  Swelling of the abdomen that is new, acute  Fever of 100F or higher   For urgent or emergent issues, a gastroenterologist can be reached at any hour by calling [phone].   DIET:  We do recommend a small meal at first, but then you may proceed to your regular diet.  Drink plenty of fluids but you should avoid alcoholic beverages for 24 hours.  ACTIVITY:  You should plan to take it easy for the rest of today and you should NOT DRIVE  or use heavy machinery until tomorrow (because of the sedation medicines used during the test).    FOLLOW UP: Our staff will call the number listed on your records the next business day following your procedure to check on you and address any questions or concerns that you may have regarding the information given to you following your procedure. If we do not reach you, we will leave a message.  However, if you are feeling well and you are not experiencing any problems, there is no need to return our call.  We will assume that you have returned to your regular daily activities without incident.  If any biopsies were taken you will be contacted by phone or by letter within the next 1-3 weeks.  Please call us at [phone] if you have not heard about the biopsies in 3 weeks.    SIGNATURES/CONFIDENTIALITY: You and/or your care partner have signed paperwork which will be entered into your electronic medical record.  These signatures attest to the fact that that the information above on your After Visit Summary has been reviewed and is understood.  Full responsibility of the confidentiality of this discharge information lies with you and/or your care-partner. 

## 2018-07-22 NOTE — Progress Notes (Signed)
Called to room to assist during endoscopic procedure.  Patient ID and intended procedure confirmed with present staff. Received instructions for my participation in the procedure from the performing physician.  

## 2018-07-23 ENCOUNTER — Telehealth: Payer: Self-pay

## 2018-07-23 ENCOUNTER — Telehealth: Payer: Self-pay | Admitting: *Deleted

## 2018-07-23 NOTE — Telephone Encounter (Signed)
  Follow up Call-  Call back number 07/22/2018  Post procedure Call Back phone  # [phone]  Permission to leave phone message Yes  Some recent data might be hidden     Patient questions:  Do you have a fever, pain , or abdominal swelling? No. Pain Score  0 *  Have you tolerated food without any problems? Yes.    Have you been able to return to your normal activities? Yes.    Do you have any questions about your discharge instructions: Diet   No. Medications  No. Follow up visit  No.  Do you have questions or concerns about your Care? No.  Actions: * If pain score is 4 or above: No action needed, pain <4.

## 2018-07-23 NOTE — Telephone Encounter (Signed)
Attempted to reach pt. With follow-up call following endoscopic procedure 07/22/2018.  LM on pt. Voice mail.  Will try to reach pt. Again later today. 

## 2018-07-27 ENCOUNTER — Encounter: Payer: Self-pay | Admitting: Internal Medicine

## 2018-08-03 ENCOUNTER — Telehealth: Payer: Self-pay | Admitting: Internal Medicine

## 2018-08-03 MED ORDER — HYDROCHLOROTHIAZIDE 25 MG PO TABS: 25.0000 mg | ORAL_TABLET | Freq: Every day | ORAL | 3 refills | Status: DC

## 2018-08-03 MED ORDER — LOSARTAN POTASSIUM 100 MG PO TABS: 100.0000 mg | ORAL_TABLET | Freq: Every day | ORAL | 3 refills | Status: DC

## 2018-08-03 NOTE — Telephone Encounter (Signed)
Ok to let pt know, as the losartan HCT 100/25 mg is on back order, we had to change this to:  Losartan 100 qd, and  HCT 25 qd - done erx

## 2018-08-05 NOTE — Telephone Encounter (Signed)
Called pt, LVM with details below  

## 2018-08-29 ENCOUNTER — Other Ambulatory Visit: Payer: Self-pay | Admitting: Endocrinology

## 2018-08-29 NOTE — Telephone Encounter (Signed)
Please forward refill request to pt's new primary care provider.  

## 2018-09-09 ENCOUNTER — Ambulatory Visit: Payer: Medicare Other | Admitting: Endocrinology

## 2018-09-09 ENCOUNTER — Encounter: Payer: Self-pay | Admitting: Endocrinology

## 2018-09-09 VITALS — BP 132/70 | HR 88 | Ht 68.0 in | Wt 263.2 lb

## 2018-09-09 DIAGNOSIS — E1151 Type 2 diabetes mellitus with diabetic peripheral angiopathy without gangrene: Secondary | ICD-10-CM

## 2018-09-09 DIAGNOSIS — Z794 Long term (current) use of insulin: Secondary | ICD-10-CM | POA: Diagnosis not present

## 2018-09-09 LAB — POCT GLYCOSYLATED HEMOGLOBIN (HGB A1C): HEMOGLOBIN A1C: 9.3 % — AB (ref 4.0–5.6)

## 2018-09-09 MED ORDER — INSULIN NPH (HUMAN) (ISOPHANE) 100 UNIT/ML ~~LOC~~ SUSP: SUBCUTANEOUS | 3 refills | Status: DC

## 2018-09-09 NOTE — Patient Instructions (Addendum)
Please increase the morning insulin to 220 units.  Please continue the same evening insulin.   Please find out if lantus or basaglar is cheaper.  Please let us know if it is.   check your blood sugar twice a day.  vary the time of day when you check, between before the 3 meals, and at bedtime.  also check if you have symptoms of your blood sugar being too high or too low.  please keep a record of the readings and bring it to your next appointment here (or you can bring the meter itself).  You can write it on any piece of paper.  please call us sooner if your blood sugar goes below 70, or if you have a lot of readings over 200.   Please come back for a follow-up appointment in 2 months.

## 2018-09-09 NOTE — Progress Notes (Signed)
Subjective:    Patient ID: [name], male    DOB: 03-18-1956, 63 y.o.   MRN: [id number]  HPI Pt returns for f/u of diabetes mellitus: DM type: Insulin-requiring type 2.   Dx'ed: 7893 Complications: PAD, renal insuff, and polyneuropathy.  Therapy: insulin since 2009 DKA: never Severe hypoglycemia: never.  Pancreatitis: never.  Other: he takes a BID insulin regimen, after poor results with multiple daily injections; he takes human insulin, due to cost.   Interval history: Meter is downloaded today, and the printout is scanned into the record.  It varies from 90-400's.  It is in general higher as the day goes on.  No new sxs.  Pt says he never misses the insulin.   Past Medical History:  Diagnosis Date  . Allergic rhinitis   . At risk for sleep apnea    STOP-BANG= 5   SENT TO PCP 03-14-2014  . Cataract    surgically removed bilateral  . Condyloma acuminatum of penis   . Diabetic neuropathy ([place])   . GERD (gastroesophageal reflux disease)   . History of bladder cancer    s/p  turbt  2013/   transitional cell carcinoma--   . History of condyloma acuminatum    PERINEAL AREA  W/ RECURRENCY  . History of gout   . Hyperlipidemia   . Hypertension   . Lower urinary tract symptoms (LUTS)   . Productive cough   . PVD (peripheral vascular disease) with claudication (H[facility])    bilateral SFA disease-- right > left  and left tibial artery disease--  per duplex  . Smokers' cough ([place])   . Type 2 diabetes mellitus with insulin therapy ([place])    monitor by  dr ellsion  . Wears dentures    upper    Past Surgical History:  Procedure Laterality Date  . AXILLARY HIDRADENITIS EXCISION  1997  . CARDIOVASCULAR STRESS TEST  07-24-2014  dr Kathlyn [place]   Low risk lexiscan nuclear study with apical thinning and small inferolateral wall infarct at mid & basal level , no ischemia/  normal LVF and wall motion , ef 59%  . CATARACT EXTRACTION Left   . CATARACT EXTRACTION W/ INTRAOCULAR LENS  IMPLANT Right   . CO2 LASER APPLICATION N/A 03/20/1750   Procedure: CO2 LASER APPLICATION,PENIS, GROIN, ANUS;  Surgeon: [name], MD;  Location: [facility];  Service: General;  Laterality: N/A;  . CO2 LASER APPLICATION N/A 02/58/5277   Procedure: CO2 LASER APPLICATION;  Surgeon: Kathie Rhodes, MD;  Location: [facility];  Service: Urology;  Laterality: N/A;  . CONDYLOMA EXCISION/FULGURATION N/A 05/21/2015   Procedure: CONDYLOMA REMOVAL;  Surgeon: Kathie Rhodes, MD;  Location: [facility];  Service: Urology;  Laterality: N/A;  . HEMORRHOID SURGERY  10/24/2014   Procedure: HEMORRHOIDECTOMY;  Surgeon: Michael Boston, MD;  Location: [facility];  Service: General;;  . INCISION AND DRAINAGE ABSCESS Left 10/24/2014   Procedure: INCISION AND DRAINAGE ABSCESS;  Surgeon: Michael Boston, MD;  Location: [facility];  Service: General;  Laterality: Left;  . INGUINAL HIDRADENITIS EXCISION  1998, 1999  . LASER ABLATION CONDOLAMATA N/A 03/20/2014   Procedure: EXAM UNDER ANESTHESIA, REMOVAL/ABLATION OF CONDYLOMATA PENIS,GROINS, ANUS, ANAL CANAL;  Surgeon: [name], MD;  Location: [facility];  Service: General;  Laterality: N/A;  groin and anus  . LASER ABLATION CONDOLAMATA N/A 10/24/2014   Procedure: LASER ABLATION CONDOLAMATA;  Surgeon: Michael Boston, MD;  Location: [facility]  [place];  Service: General;  Laterality: N/A;  . LASER ABLATION OF PENILE AND PERIANAL WARTS  07-29-2007  Dr. Johney Maine  . LEFT SHOULDER SURGERY  2003  . MASS EXCISION N/A 10/24/2014   Procedure: EXCISION OF PERINEAL MASS/SINUS;  Surgeon: Michael Boston, MD;  Location: [facility];  Service: General;  Laterality: N/A;  . MOHS SURGERY     back  . MOHS SURGERY  2017   face  . MULTIPLE TOOTH EXTRACTIONS    . PERINEAL HIDRADENITIS EXCISION  1998, 1999  . TRANSURETHRAL RESECTION OF BLADDER TUMOR  05/21/2012   Procedure:  TRANSURETHRAL RESECTION OF BLADDER TUMOR (TURBT);  Surgeon: Claybon Jabs, MD;  Location[facility];  Service: Urology;  Laterality: N/A;       Social History   Socioeconomic History  . Marital status: Married    Spouse name: Not on file  . Number of children: Not on file  . Years of education: Not on file  . Highest education level: Not on file  Occupational History  . Occupation: Disabled  Social Needs  . Financial resource strain: Not on file  . Food insecurity:    Worry: Not on file    Inability: Not on file  . Transportation needs:    Medical: Not on file    Non-medical: Not on file  Tobacco Use  . Smoking status: Former Smoker    Packs/day: 1.00    Years: 41.00    Pack years: 41.00    Types: Cigars, Cigarettes    Last attempt to quit: 04/11/2016    Years since quitting: 2.4  . Smokeless tobacco: Never Used  Substance and Sexual Activity  . Alcohol use: No    Alcohol/week: 0.0 standard drinks  . Drug use: No  . Sexual activity: Not on file  Lifestyle  . Physical activity:    Days per week: Not on file    Minutes per session: Not on file  . Stress: Not on file  Relationships  . Social connections:    Talks on phone: Not on file    Gets together: Not on file    Attends religious service: Not on file    Active member of club or organization: Not on file    Attends meetings of clubs or organizations: Not on file    Relationship status: Not on file  . Intimate partner violence:    Fear of current or ex partner: Not on file    Emotionally abused: Not on file    Physically abused: Not on file    Forced sexual activity: Not on file  Other Topics Concern  . Not on file  Social History Narrative  . Not on file    Current Outpatient Medications on File Prior to Visit  Medication Sig Dispense Refill  . amLODipine (NORVASC) 2.5 MG tablet Take 1 tablet (2.5 mg total) by mouth daily. 90 tablet 3  . aspirin EC 81 MG tablet Take 1 tablet (81 mg  total) by mouth daily.    Marland Kitchen atorvastatin (LIPITOR) 20 MG tablet TAKE 1 TABLET BY MOUTH  DAILY 90 tablet 1  . hydrochlorothiazide (HYDRODIURIL) 25 MG tablet Take 1 tablet (25 mg total) by mouth daily. 90 tablet 3  . losartan (COZAAR) 100 MG tablet Take 1 tablet (100 mg total) by mouth daily. 90 tablet 3  . metFORMIN (GLUCOPHAGE-XR) 500 MG 24 hr tablet Take 1 tablet (500 mg total) by mouth daily with breakfast. 90 tablet 3  .  pantoprazole (PROTONIX) 40 MG tablet Take 1 tablet (40 mg total) by mouth daily. 90 tablet 3  . potassium chloride 20 MEQ TBCR Take 20 mEq by mouth daily. 30 tablet 6   No current facility-administered medications on file prior to visit.     No Known Allergies  Family History  Problem Relation Age of Onset  . Hypertension Mother   . Cancer Father        lung ca  . Diabetes Maternal Aunt        x 2  . Colon cancer Neg Hx   . Esophageal cancer Neg Hx   . Pancreatic cancer Neg Hx   . Prostate cancer Neg Hx   . Kidney disease Neg Hx   . Liver disease Neg Hx   . Lung cancer Neg Hx   . Rectal cancer Neg Hx   . Stomach cancer Neg Hx     BP 132/70 (BP Location: Right Arm, Patient Position: Sitting, Cuff Size: Large)   Pulse 88   Ht 5\' 8"  (1.727 m)   Wt 263 lb 3.2 oz (119.4 kg)   SpO2 93%   BMI 40.02 kg/m    Review of Systems He denies hypoglycemia.  He has gained weight.      Objective:   Physical Exam VITAL SIGNS:  See vs page GENERAL: no distress Pulses: dorsalis pedis intact bilat.   MSK: no deformity of the feet CV: trace bilat leg edema, and bilat vv's. Skin: no ulcer on the feet.  normal color and temp on the feet. Neuro: sensation is intact to touch on the feet.  Ext: There is bilateral onychomycosis of the toenails.   Lab Results  Component Value Date   HGBA1C 9.3 (A) 09/09/2018   Lab Results  Component Value Date   CREATININE 1.16 06/15/2018   BUN 13 06/15/2018   NA 139 06/15/2018   K 3.8 06/15/2018   CL 100 06/15/2018   CO2 31  06/15/2018       Assessment & Plan:  Insulin-requiring type 2 DM, with PAD: worse Renal insuff: in this setting, he does not need much PM insulin  Edema: this limits options.   Patient Instructions  Please increase the morning insulin to 220 units.  Please continue the same evening insulin.   Please find out if lantus or basaglar is cheaper.  Please let us know if it is.   check your blood sugar twice a day.  vary the time of day when you check, between before the 3 meals, and at bedtime.  also check if you have symptoms of your blood sugar being too high or too low.  please keep a record of the readings and bring it to your next appointment here (or you can bring the meter itself).  You can write it on any piece of paper.  please call us sooner if your blood sugar goes below 70, or if you have a lot of readings over 200.   Please come back for a follow-up appointment in 2 months.

## 2018-09-24 ENCOUNTER — Telehealth: Payer: Self-pay | Admitting: Endocrinology

## 2018-09-24 NOTE — Telephone Encounter (Signed)
Patient stated he is going to need to change his medications.  He is needing to have this changed to 90 day prescription of Lantus . He stated what he is taking Is not working    Please advise   [place], [place]

## 2018-09-27 DIAGNOSIS — E109 Type 1 diabetes mellitus without complications: Secondary | ICD-10-CM | POA: Diagnosis not present

## 2018-09-27 MED ORDER — INSULIN GLARGINE 100 UNIT/ML SOLOSTAR PEN: 250.0000 [IU] | PEN_INJECTOR | SUBCUTANEOUS | 99 refills | Status: DC

## 2018-09-27 NOTE — Telephone Encounter (Signed)
Please advise re: pt request to change insulin from Novolin N 220 units qam, 30 units qpm to Lantus 90day supply. If agreeable to this change, please provide dosage and frequency as well as qty to dispense and # of refills

## 2018-09-27 NOTE — Telephone Encounter (Signed)
Ok, I sent rx.  If you take lantus, you don't have to break it up twice a day.  You can take 250 units qam

## 2018-09-27 NOTE — Telephone Encounter (Signed)
Called pt and made him aware. Verbalized acceptance and understanding. 

## 2018-09-27 NOTE — Addendum Note (Signed)
Addended by: [name] on: 09/27/2018 01:02 PM   Modules accepted: Orders

## 2018-10-03 ENCOUNTER — Other Ambulatory Visit: Payer: Self-pay | Admitting: Endocrinology

## 2018-10-03 NOTE — Telephone Encounter (Signed)
Please forward refill request to pt's new primary care provider.  

## 2018-10-07 DIAGNOSIS — L821 Other seborrheic keratosis: Secondary | ICD-10-CM | POA: Diagnosis not present

## 2018-10-07 DIAGNOSIS — L820 Inflamed seborrheic keratosis: Secondary | ICD-10-CM | POA: Diagnosis not present

## 2018-10-07 DIAGNOSIS — C44319 Basal cell carcinoma of skin of other parts of face: Secondary | ICD-10-CM | POA: Diagnosis not present

## 2018-10-07 DIAGNOSIS — D225 Melanocytic nevi of trunk: Secondary | ICD-10-CM | POA: Diagnosis not present

## 2018-10-07 DIAGNOSIS — L720 Epidermal cyst: Secondary | ICD-10-CM | POA: Diagnosis not present

## 2018-10-19 DIAGNOSIS — C44319 Basal cell carcinoma of skin of other parts of face: Secondary | ICD-10-CM | POA: Diagnosis not present

## 2018-10-25 ENCOUNTER — Other Ambulatory Visit: Payer: Self-pay

## 2018-10-25 MED ORDER — ATORVASTATIN CALCIUM 20 MG PO TABS: 20.0000 mg | ORAL_TABLET | Freq: Every day | ORAL | 1 refills | Status: DC

## 2018-10-25 MED ORDER — AMLODIPINE BESYLATE 2.5 MG PO TABS: 2.5000 mg | ORAL_TABLET | Freq: Every day | ORAL | 1 refills | Status: DC

## 2018-10-26 ENCOUNTER — Other Ambulatory Visit: Payer: Self-pay | Admitting: Endocrinology

## 2018-11-17 ENCOUNTER — Telehealth: Payer: Self-pay | Admitting: Cardiovascular Disease

## 2018-11-17 NOTE — Telephone Encounter (Signed)
Virtual Visit Pre-Appointment Phone Call  Steps For Call:  1. Confirm consent - "In the setting of the current Covid19 crisis, you are scheduled for a (phone or video) visit with your provider on (date) at (time).  Just as we do with many in-office visits, in order for you to participate in this visit, we must obtain consent.  If you'd like, I can send this to your mychart (if signed up) or email for you to review.  Otherwise, I can obtain your verbal consent now.  All virtual visits are billed to your insurance company just like a normal visit would be.  By agreeing to a virtual visit, we'd like you to understand that the technology does not allow for your provider to perform an examination, and thus may limit your provider's ability to fully assess your condition.  Finally, though the technology is pretty good, we cannot assure that it will always work on either your or our end, and in the setting of a video visit, we may have to convert it to a phone-only visit.  In either situation, we cannot ensure that we have a secure connection.  Are you willing to proceed?"  2. Give patient instructions for WebEx download to smartphone as below if video visit  3. Advise patient to be prepared with any vital sign or heart rhythm information, their current medicines, and a piece of paper and pen handy for any instructions they may receive the day of their visit  4. Inform patient they will receive a phone call 15 minutes prior to their appointment time (may be from unknown caller ID) so they should be prepared to answer  5. Confirm that appointment type is correct in Epic appointment notes (video vs telephone)    TELEPHONE CALL NOTE  PASHA BROAD has been deemed a candidate for a follow-up tele-health visit to limit community exposure during the Covid-19 pandemic. I spoke with the patient via phone to ensure availability of phone/video source, confirm preferred email & phone number, and discuss  instructions and expectations.  I reminded SHONTA PHILLIS to be prepared with any vital sign and/or heart rhythm information that could potentially be obtained via home monitoring, at the time of his visit. I reminded FREDDERICK SWANGER to expect a phone call at the time of his visit if his visit.  Did the patient verbally acknowledge consent to treatment? YES  Clarisse Gouge 11/17/2018 9:42 AM   DOWNLOADING THE WEBEX SOFTWARE TO SMARTPHONE  - If Apple, go to [employer] and type in WebEx in the search bar. [place] [employer], the blue/green circle. The app is free but as with any other app downloads, their phone may require them to verify saved payment information or Apple password. The patient does NOT have to create an account.  - If Android, ask patient to go to [employer] and type in WebEx in the search bar. [place] [employer], the blue/green circle. The app is free but as with any other app downloads, their phone may require them to verify saved payment information or Android password. The patient does NOT have to create an account.   CONSENT FOR TELE-HEALTH VISIT - PLEASE REVIEW  I hereby voluntarily request, consent and authorize [facility] and its employed or contracted physicians, physician assistants, nurse practitioners or other licensed health care professionals (the Practitioner), to provide me with telemedicine health care services (the Services") as deemed necessary by the treating Practitioner. I  acknowledge and consent to receive the Services by the Practitioner via telemedicine. I understand that the telemedicine visit will involve communicating with the Practitioner through live audiovisual communication technology and the disclosure of certain medical information by electronic transmission. I acknowledge that I have been given the opportunity to request an in-person assessment or other available alternative prior to the telemedicine visit and am  voluntarily participating in the telemedicine visit.  I understand that I have the right to withhold or withdraw my consent to the use of telemedicine in the course of my care at any time, without affecting my right to future care or treatment, and that the Practitioner or I may terminate the telemedicine visit at any time. I understand that I have the right to inspect all information obtained and/or recorded in the course of the telemedicine visit and may receive copies of available information for a reasonable fee.  I understand that some of the potential risks of receiving the Services via telemedicine include:   Delay or interruption in medical evaluation due to technological equipment failure or disruption;  Information transmitted may not be sufficient (e.g. poor resolution of images) to allow for appropriate medical decision making by the Practitioner; and/or   In rare instances, security protocols could fail, causing a breach of personal health information.  Furthermore, I acknowledge that it is my responsibility to provide information about my medical history, conditions and care that is complete and accurate to the best of my ability. I acknowledge that Practitioner's advice, recommendations, and/or decision may be based on factors not within their control, such as incomplete or inaccurate data provided by me or distortions of diagnostic images or specimens that may result from electronic transmissions. I understand that the practice of medicine is not an exact science and that Practitioner makes no warranties or guarantees regarding treatment outcomes. I acknowledge that I will receive a copy of this consent concurrently upon execution via email to the email address I last provided but may also request a printed copy by calling the office of [facility].    I understand that my insurance will be billed for this visit.   I have read or had this consent read to me.  I understand the  contents of this consent, which adequately explains the benefits and risks of the Services being provided via telemedicine.   I have been provided ample opportunity to ask questions regarding this consent and the Services and have had my questions answered to my satisfaction.  I give my informed consent for the services to be provided through the use of telemedicine in my medical care  By participating in this telemedicine visit I agree to the above.

## 2018-11-30 ENCOUNTER — Other Ambulatory Visit: Payer: Self-pay

## 2018-11-30 ENCOUNTER — Encounter: Payer: Self-pay | Admitting: Cardiovascular Disease

## 2018-11-30 ENCOUNTER — Telehealth (INDEPENDENT_AMBULATORY_CARE_PROVIDER_SITE_OTHER): Payer: Medicare Other | Admitting: Cardiovascular Disease

## 2018-11-30 VITALS — BP 130/72 | Ht 68.0 in | Wt 260.0 lb

## 2018-11-30 DIAGNOSIS — I739 Peripheral vascular disease, unspecified: Secondary | ICD-10-CM

## 2018-11-30 NOTE — Progress Notes (Signed)
Virtual Visit via Telephone Note   This visit type was conducted due to national recommendations for restrictions regarding the COVID-19 Pandemic (e.g. social distancing) in an effort to limit this patient's exposure and mitigate transmission in our community.  Due to his co-morbid illnesses, this patient is at least at moderate risk for complications without adequate follow up.  This format is felt to be most appropriate for this patient at this time.  The patient did not have access to video technology/had technical difficulties with video requiring transitioning to audio format only (telephone).  All issues noted in this document were discussed and addressed.  No physical exam could be performed with this format.  Please refer to the patient's chart for his  consent to telehealth for [facility].   Evaluation Performed:  Follow-up visit  Date:  11/30/2018   ID:  [name], [name], MRN [id number]  Patient Location: Home Provider Location: Office  PCP:  Biagio Borg, MD  Cardiologist:  No primary care provider on file.  Electrophysiologist:  None   Chief Complaint: Follow-up visit  History of Present Illness:    [name] is a 63 y.o. male .  This is a follow-up visit regarding peripheral arterial disease and coronary artery disease.  He has known history of insulin-requiring DM (dx'ed 1992),  Hyperlipidemia, hypertension and previous tobacco use. He is followed for right calf claudication due to occluded right SFA. Noninvasive evaluation in August 2015 showed ABI: normal on the left and 0.44 on right.  Duplex showed long occlusion of right SFA from mid to distal segment with reconstitution in popliteal artery. There was significant left SFA stenosis.   He had atypical chest pain in late 2015. Nuclear stress test showed a small inferolateral infarct with no significant ischemia. He was treated medically given that his chest pain resolved.   He quit smoking in  December 2017 but gained weight since then.   He reports intermittent episodes of chest pain described as sharp discomfort happening at rest and lasting for few seconds.  Last episode was about 2 months ago.  He has stable exertional dyspnea.  He has no significant leg claudication. His diabetes is still not controlled in spite of high-dose insulin.   The patient does not have symptoms concerning for COVID-19 infection (fever, chills, cough, or new shortness of breath).    Past Medical History:  Diagnosis Date   Allergic rhinitis    At risk for sleep apnea    STOP-BANG= 5   SENT TO PCP 03-14-2014   Cataract    surgically removed bilateral   Condyloma acuminatum of penis    Diabetic neuropathy (HCC)    GERD (gastroesophageal reflux disease)    History of bladder cancer    s/p  turbt  2013/   transitional cell carcinoma--    History of condyloma acuminatum    PERINEAL AREA  W/ RECURRENCY   History of gout    Hyperlipidemia    Hypertension    Lower urinary tract symptoms (LUTS)    Productive cough    PVD (peripheral vascular disease) with claudication (HCC)    bilateral SFA disease-- right > left  and left tibial artery disease--  per duplex   Smokers' cough ([place])    Type 2 diabetes mellitus with insulin therapy ([place])    monitor by  dr ellsion   Wears dentures    upper   Past Surgical History:  Procedure Laterality Date   AXILLARY HIDRADENITIS  EXCISION  1997   CARDIOVASCULAR STRESS TEST  07-24-2014  dr Kathlyn [place]   Low risk lexiscan nuclear study with apical thinning and small inferolateral wall infarct at mid & basal level , no ischemia/  normal LVF and wall motion , ef 59%   CATARACT EXTRACTION Left    CATARACT EXTRACTION W/ INTRAOCULAR LENS IMPLANT Right    CO2 LASER APPLICATION N/A 05/01/1940   Procedure: CO2 LASER APPLICATION,PENIS, Virl Son, ANUS;  Surgeon: [name], MD;  Location: [place];  Service: General;  Laterality:  N/A;   CO2 LASER APPLICATION N/A 74/03/1447   Procedure: CO2 LASER APPLICATION;  Surgeon: Kathie Rhodes, MD;  Location: [place];  Service: Urology;  Laterality: N/A;   CONDYLOMA EXCISION/FULGURATION N/A 05/21/2015   Procedure: CONDYLOMA REMOVAL;  Surgeon: Kathie Rhodes, MD;  Location: [facility];  Service: Urology;  Laterality: N/A;   HEMORRHOID SURGERY  10/24/2014   Procedure: HEMORRHOIDECTOMY;  Surgeon: Michael Boston, MD;  [facility];  Service: General;;   INCISION AND DRAINAGE ABSCESS Left 10/24/2014   Procedure: INCISION AND DRAINAGE ABSCESS;  Surgeon: Michael Boston, MD;  Location: [place];  Service: General;  Laterality: Left;   INGUINAL HIDRADENITIS [place] N/A 03/20/2014   Procedure: EXAM UNDER ANESTHESIA, REMOVAL/ABLATION OF CONDYLOMATA PENIS,GROINS, ANUS, ANAL CANAL;  Surgeon: [name], MD;  Location: [place];  Service: General;  Laterality: N/A;  groin and anus   LASER ABLATION CONDOLAMATA N/A 10/24/2014   Procedure: LASER ABLATION CONDOLAMATA;  Surgeon: Michael Boston, MD;  Location: [place];  Service: General;  Laterality: N/A;   LASER ABLATION OF PENILE AND PERIANAL WARTS  07-29-2007  Dr. Johney [place]   LEFT SHOULDER SURGERY  2003   MASS EXCISION N/A 10/24/2014   Procedure: EXCISION OF PERINEAL MASS/SINUS;  Surgeon: Michael Boston, MD;  Location: [place];  Service: General;  Laterality: N/A;   MOHS SURGERY     back   MOHS SURGERY  2017   face   MULTIPLE TOOTH EXTRACTIONS     PERINEAL HIDRADENITIS [place] TUMOR  05/21/2012   Procedure: TRANSURETHRAL RESECTION OF BLADDER TUMOR (TURBT);  Surgeon: Clay[place] Jabs, MD;  Location[facility];  Service: Urology;  Laterality: N/A;        Current Meds  Medication Sig   amLODipine (NORVASC)  2.5 MG tablet Take 1 tablet (2.5 mg total) by mouth daily.   aspirin EC 81 MG tablet Take 1 tablet (81 mg total) by mouth daily.   atorvastatin (LIPITOR) 20 MG tablet Take 1 tablet (20 mg total) by mouth daily.   hydrochlorothiazide (HYDRODIURIL) 25 MG tablet Take 1 tablet (25 mg total) by mouth daily.   Insulin Glargine (LANTUS SOLOSTAR) 100 UNIT/ML Solostar Pen Inject 250 Units into the skin every morning. (Patient taking differently: Inject 240 Units into the skin every morning. )   losartan (COZAAR) 100 MG tablet Take 1 tablet (100 mg total) by mouth daily.   metFORMIN (GLUCOPHAGE-XR) 500 MG 24 hr tablet TAKE 1 TABLET BY MOUTH ONCE DAILY WITH BREAKFAST   pantoprazole (PROTONIX) 40 MG tablet Take 1 tablet (40 mg total) by mouth daily.   potassium chloride 20 MEQ TBCR Take 20 mEq by mouth daily.     Allergies:   Patient has no known allergies.   Social History   Tobacco Use  Smoking status: Former Smoker    Packs/day: 1.00    Years: 41.00    Pack years: 41.00    Types: Cigars, Cigarettes    Last attempt to quit: 04/11/2016    Years since quitting: 2.6   Smokeless tobacco: Never Used  Substance Use Topics   Alcohol use: No    Alcohol/week: 0.0 standard drinks   Drug use: No     Family Hx: The patient's family history includes Cancer in his father; Diabetes in his maternal aunt; Hypertension in his mother. There is no history of Colon cancer, Esophageal cancer, Pancreatic cancer, Prostate cancer, Kidney disease, Liver disease, Lung cancer, Rectal cancer, or Stomach cancer.  ROS:   Please see the history of present illness.     All other systems reviewed and are negative.   Prior CV studies:   The following studies were reviewed today:    Labs/Other Tests and Data Reviewed:    EKG:  No ECG reviewed.  Recent Labs: 06/15/2018: ALT 50; BUN 13; Creatinine, Ser 1.16; Hemoglobin 15.8; Platelets 307.0; Potassium 3.8; Sodium 139; TSH 2.89   Recent Lipid Panel Lab  Results  Component Value Date/Time   CHOL 120 06/15/2018 04:52 PM   CHOL 123 03/29/2018 07:46 AM   TRIG 256.0 (H) 06/15/2018 04:52 PM   HDL 30.80 (L) 06/15/2018 04:52 PM   HDL 28 (L) 03/29/2018 07:46 AM   CHOLHDL 4 06/15/2018 04:52 PM   LDLCALC 34 03/29/2018 07:46 AM   LDLDIRECT 67.0 06/15/2018 04:52 PM    Wt Readings from Last 3 Encounters:  11/30/18 260 lb (117.9 kg)  09/09/18 263 lb 3.2 oz (119.4 kg)  07/22/18 262 lb (118.8 kg)     Objective:    Vital Signs:  BP 130/72    Ht 5\' 8"  (1.727 m)    Wt 260 lb (117.9 kg)    BMI 39.53 kg/m      ASSESSMENT & PLAN:    1.  Peripheral arterial disease: Known occluded right SFA .he denies claudication at the present time.  Continue medical therapy  2. Coronary artery disease involving native coronary arteries without angina: Previous stress test in 2015 showed evidence of a small prior inferolateral infarct. No anginal symptoms.  Most recent echocardiogram showed normal LV systolic function.  3. Essential hypertension Blood pressure i controlled. 4. Hyperlipidemia: Continue treatment with atorvastatin .  Most recent LDL was 67.  COVID-19 Education: The signs and symptoms of COVID-19 were discussed with the patient and how to seek care for testing (follow up with PCP or arrange E-visit).  The importance of social distancing was discussed today.  Time:   Today, I have spent 24 minutes with the patient with telehealth technology discussing the above problems.     Medication Adjustments/Labs and Tests Ordered: Current medicines are reviewed at length with the patient today.  Concerns regarding medicines are outlined above.   Tests Ordered: No orders of the defined types were placed in this encounter.   Medication Changes: No orders of the defined types were placed in this encounter.   Disposition:  Follow up in 6 month(s)  Signed, Kathlyn [place], MD  11/30/2018 4:50 PM    [place]

## 2018-11-30 NOTE — Patient Instructions (Signed)
Medication Instructions:  Continue same medications If you need a refill on your cardiac medications before your next appointment, please call your pharmacy.   Lab work: None If you have labs (blood work) drawn today and your tests are completely normal, you will receive your results only by: Marland Kitchen MyChart Message (if you have MyChart) OR . A paper copy in the mail If you have any lab test that is abnormal or we need to change your treatment, we will call you to review the results.  Testing/Procedures: None  Follow-Up: At [facility], you and your health needs are our priority.  As part of our continuing mission to provide you with exceptional heart care, we have created designated Provider Care Teams.  These Care Teams include your primary Cardiologist (physician) and Advanced Practice Providers (APPs -  Physician Assistants and Nurse Practitioners) who all work together to provide you with the care you need, when you need it. You will need a follow up appointment in 6 months.

## 2018-12-08 ENCOUNTER — Encounter: Payer: Self-pay | Admitting: Endocrinology

## 2018-12-09 ENCOUNTER — Ambulatory Visit: Payer: Medicare Other | Admitting: Endocrinology

## 2018-12-09 ENCOUNTER — Ambulatory Visit (INDEPENDENT_AMBULATORY_CARE_PROVIDER_SITE_OTHER): Payer: Medicare Other | Admitting: Endocrinology

## 2018-12-09 ENCOUNTER — Ambulatory Visit: Payer: Medicare Other | Admitting: Internal Medicine

## 2018-12-09 ENCOUNTER — Other Ambulatory Visit: Payer: Self-pay

## 2018-12-09 DIAGNOSIS — Z794 Long term (current) use of insulin: Secondary | ICD-10-CM

## 2018-12-09 DIAGNOSIS — E1151 Type 2 diabetes mellitus with diabetic peripheral angiopathy without gangrene: Secondary | ICD-10-CM | POA: Diagnosis not present

## 2018-12-09 DIAGNOSIS — E1129 Type 2 diabetes mellitus with other diabetic kidney complication: Secondary | ICD-10-CM | POA: Diagnosis not present

## 2018-12-09 NOTE — Progress Notes (Signed)
Subjective:    Patient ID: [name], male    DOB: February 22, 1956, 63 y.o.   MRN: [id number]  HPI  telehealth visit today via telephone, x 10 minutes  Alternatives to telehealth are presented to this patient, and the patient agrees to the telehealth visit. Pt is advised of the cost of the visit, and agrees to this, also.   Patient is at home, and I am at the office.   Persons attending the telehealth visit: the patient and I Pt returns for f/u of diabetes mellitus: DM type: Insulin-requiring type 2.   Dx'ed: 6962 Complications: PAD, renal insuff, and polyneuropathy.  Therapy: insulin since 2009 DKA: never Severe hypoglycemia: never.  Pancreatitis: never.  Other: he takes a QD insulin regimen, after poor results with more frequent injections.   Interval history: pt says cbg varies from 120-350.  There is no trend throughout the day.  No new sxs.  Pt says he never misses the insulin.  He takes 240 units qam, as 250 would require an additional injection.   Past Medical History:  Diagnosis Date  . Allergic rhinitis   . At risk for sleep apnea    STOP-BANG= 5   SENT TO PCP 03-14-2014  . Cataract    surgically removed bilateral  . Condyloma acuminatum of penis   . Diabetic neuropathy ([place])   . GERD (gastroesophageal reflux disease)   . History of bladder cancer    s/p  turbt  2013/   transitional cell carcinoma--   . History of condyloma acuminatum    PERINEAL AREA  W/ RECURRENCY  . History of gout   . Hyperlipidemia   . Hypertension   . Lower urinary tract symptoms (LUTS)   . Productive cough   . PVD (peripheral vascular disease) with claudication ([facility])    bilateral SFA disease-- right > left  and left tibial artery disease--  per duplex  . Smokers' cough ([place])   . Type 2 diabetes mellitus with insulin therapy ([place])    monitor by  dr ellsion  . Wears dentures    upper    Past Surgical History:  Procedure Laterality Date  . AXILLARY HIDRADENITIS EXCISION  1997  .  CARDIOVASCULAR STRESS TEST  07-24-2014  dr Kathlyn [place]   Low risk lexiscan nuclear study with apical thinning and small inferolateral wall infarct at mid & basal level , no ischemia/  normal LVF and wall motion , ef 59%  . CATARACT EXTRACTION Left   . CATARACT EXTRACTION W/ INTRAOCULAR LENS IMPLANT Right   . CO2 LASER APPLICATION N/A 9/52/8413   Procedure: CO2 LASER APPLICATION,PENIS, GROIN, ANUS;  Surgeon: [name], MD;  Location: [place];  Service: General;  Laterality: N/A;  . CO2 LASER APPLICATION N/A 24/40/1027   Procedure: CO2 LASER APPLICATION;  Surgeon: Kathie Rhodes, MD;  Location: [facility];  Service: Urology;  Laterality: N/A;  . CONDYLOMA EXCISION/FULGURATION N/A 05/21/2015   Procedure: CONDYLOMA REMOVAL;  Surgeon: Kathie Rhodes, MD;  Location: [facility]  Service: Urology;  Laterality: N/A;  . HEMORRHOID SURGERY  10/24/2014   Procedure: HEMORRHOIDECTOMY;  Surgeon: Michael Boston, MD;  Location: [facility];  Service: General;;  . INCISION AND DRAINAGE ABSCESS Left 10/24/2014   Procedure: INCISION AND DRAINAGE ABSCESS;  Surgeon: Michael Boston, MD;  Location: [place];  Service: General;  Laterality: Left;  . INGUINAL HIDRADENITIS EXCISION  1998, 1999  . LASER ABLATION CONDOLAMATA N/A 03/20/2014  Procedure: EXAM UNDER ANESTHESIA, REMOVAL/ABLATION OF CONDYLOMATA PENIS,GROINS, ANUS, ANAL CANAL;  Surgeon: [name], MD;  Location: [place];  Service: General;  Laterality: N/A;  groin and anus  . LASER ABLATION CONDOLAMATA N/A 10/24/2014   Procedure: LASER ABLATION CONDOLAMATA;  Surgeon: Michael Boston, MD;  Location: [facility];  Service: General;  Laterality: N/A;  . LASER ABLATION OF PENILE AND PERIANAL WARTS  07-29-2007  Dr. Johney Maine  . LEFT SHOULDER SURGERY  2003  . MASS EXCISION N/A 10/24/2014   Procedure: EXCISION OF PERINEAL MASS/SINUS;  Surgeon: Michael Boston,  MD;  Location: [place];  Service: General;  Laterality: N/A;  . MOHS SURGERY     back  . MOHS SURGERY  2017   face  . MULTIPLE TOOTH EXTRACTIONS    . PERINEAL HIDRADENITIS EXCISION  1998, 1999  . TRANSURETHRAL RESECTION OF BLADDER TUMOR  05/21/2012   Procedure: TRANSURETHRAL RESECTION OF BLADDER TUMOR (TURBT);  Surgeon: Claybon Jabs, MD;  Location: [facility];  Service: Urology;  Laterality: N/A;       Social History   Socioeconomic History  . Marital status: Married    Spouse name: Not on file  . Number of children: Not on file  . Years of education: Not on file  . Highest education level: Not on file  Occupational History  . Occupation: Disabled  Social Needs  . Financial resource strain: Not on file  . Food insecurity:    Worry: Not on file    Inability: Not on file  . Transportation needs:    Medical: Not on file    Non-medical: Not on file  Tobacco Use  . Smoking status: Former Smoker    Packs/day: 1.00    Years: 41.00    Pack years: 41.00    Types: Cigars, Cigarettes    Last attempt to quit: 04/11/2016    Years since quitting: 2.6  . Smokeless tobacco: Never Used  Substance and Sexual Activity  . Alcohol use: No    Alcohol/week: 0.0 standard drinks  . Drug use: No  . Sexual activity: Not on file  Lifestyle  . Physical activity:    Days per week: Not on file    Minutes per session: Not on file  . Stress: Not on file  Relationships  . Social connections:    Talks on phone: Not on file    Gets together: Not on file    Attends religious service: Not on file    Active member of club or organization: Not on file    Attends meetings of clubs or organizations: Not on file    Relationship status: Not on file  . Intimate partner violence:    Fear of current or ex partner: Not on file    Emotionally abused: Not on file    Physically abused: Not on file    Forced sexual activity: Not on file  Other Topics Concern  . Not on  file  Social History Narrative  . Not on file    Current Outpatient Medications on File Prior to Visit  Medication Sig Dispense Refill  . amLODipine (NORVASC) 2.5 MG tablet Take 1 tablet (2.5 mg total) by mouth daily. 90 tablet 1  . aspirin EC 81 MG tablet Take 1 tablet (81 mg total) by mouth daily.    Marland Kitchen atorvastatin (LIPITOR) 20 MG tablet Take 1 tablet (20 mg total) by mouth daily. 90 tablet 1  . hydrochlorothiazide (HYDRODIURIL) 25  MG tablet Take 1 tablet (25 mg total) by mouth daily. 90 tablet 3  . Insulin Glargine (LANTUS SOLOSTAR) 100 UNIT/ML Solostar Pen Inject 250 Units into the skin every morning. (Patient taking differently: Inject 240 Units into the skin every morning. ) 90 pen PRN  . losartan (COZAAR) 100 MG tablet Take 1 tablet (100 mg total) by mouth daily. 90 tablet 3  . metFORMIN (GLUCOPHAGE-XR) 500 MG 24 hr tablet TAKE 1 TABLET BY MOUTH ONCE DAILY WITH BREAKFAST 90 tablet 0  . pantoprazole (PROTONIX) 40 MG tablet Take 1 tablet (40 mg total) by mouth daily. 90 tablet 3  . potassium chloride 20 MEQ TBCR Take 20 mEq by mouth daily. 30 tablet 6   No current facility-administered medications on file prior to visit.     No Known Allergies  Family History  Problem Relation Age of Onset  . Hypertension Mother   . Cancer Father        lung ca  . Diabetes Maternal Aunt        x 2  . Colon cancer Neg Hx   . Esophageal cancer Neg Hx   . Pancreatic cancer Neg Hx   . Prostate cancer Neg Hx   . Kidney disease Neg Hx   . Liver disease Neg Hx   . Lung cancer Neg Hx   . Rectal cancer Neg Hx   . Stomach cancer Neg Hx     There were no vitals taken for this visit.  Review of Systems He denies hypoglycemia.     Objective:   Physical Exam    Lab Results  Component Value Date   HGBA1C 9.3 (A) 09/09/2018   Lab Results  Component Value Date   CREATININE 1.16 06/15/2018   BUN 13 06/15/2018   NA 139 06/15/2018   K 3.8 06/15/2018   CL 100 06/15/2018   CO2 31  06/15/2018       Assessment & Plan:  Type 2 DM, with renal insuf: we discussed.  I advised him to to increase insulin, or to at least check a1c.  He declines both  Patient Instructions  Please continue the same insulin.   check your blood sugar twice a day.  vary the time of day when you check, between before the 3 meals, and at bedtime.  also check if you have symptoms of your blood sugar being too high or too low.  please keep a record of the readings and bring it to your next appointment here (or you can bring the meter itself).  You can write it on any piece of paper.  please call us sooner if your blood sugar goes below 70, or if you have a lot of readings over 200.   Please come back for a follow-up appointment in 2 months.

## 2018-12-09 NOTE — Patient Instructions (Signed)
Please continue the same insulin.   check your blood sugar twice a day.  vary the time of day when you check, between before the 3 meals, and at bedtime.  also check if you have symptoms of your blood sugar being too high or too low.  please keep a record of the readings and bring it to your next appointment here (or you can bring the meter itself).  You can write it on any piece of paper.  please call us sooner if your blood sugar goes below 70, or if you have a lot of readings over 200.   Please come back for a follow-up appointment in 2 months.

## 2019-01-17 DIAGNOSIS — L732 Hidradenitis suppurativa: Secondary | ICD-10-CM | POA: Diagnosis not present

## 2019-01-18 ENCOUNTER — Other Ambulatory Visit: Payer: Self-pay | Admitting: Endocrinology

## 2019-01-20 ENCOUNTER — Telehealth: Payer: Self-pay | Admitting: Endocrinology

## 2019-01-20 NOTE — Telephone Encounter (Signed)
Wife called to advise that patients Lantus is over $600 per month but that  Stated that if Dr Loanne Drilling requested a Tier reduction the cost of the Lantus could be reduced to $99  Number at [facility] to contact is [phone]

## 2019-01-21 NOTE — Telephone Encounter (Signed)
Lantus, basaglar, and toujeo are all the same.  Which is cheapest?

## 2019-01-21 NOTE — Telephone Encounter (Signed)
Do you want pt to inquire with insurance to determine if Levemir or Nancee Liter is covered under plan. Will need to call insurance if tier reduction is required. (Not as familiar with this request)

## 2019-01-24 ENCOUNTER — Ambulatory Visit: Payer: Self-pay | Admitting: Surgery

## 2019-01-24 DIAGNOSIS — I10 Essential (primary) hypertension: Secondary | ICD-10-CM | POA: Diagnosis not present

## 2019-01-24 DIAGNOSIS — L732 Hidradenitis suppurativa: Secondary | ICD-10-CM | POA: Diagnosis not present

## 2019-01-24 NOTE — H&P (Signed)
[name] Documented: 01/24/2019 11:53 AM Location: [facility] Patient #: [id number] DOB: 1955/11/12 Married / Language: Cleophus Molt / Race: White Male  History of Present Illness [name]; 01/24/2019 12:32 PM) The patient is a 63 year old male who presents with warts. Note for "Unspecified warts": ` ` ` ` Patient returns for follow-up  Patient with significant chronic hidradenitis suppurativa and condyloma. I been seeing him intermittently since 2015. He's had surgeries before then as well. Had recommended follow up regularly for his condyloma. He not heard from him in 2 years when he complained about some swelling and drainage and just wanted antibiotics. It seemed to drain more. He came to urgent office last week and had a spontaneously draining abscess on his lower groin crease/scrotum. Wound packed. On antibiotics. He returns for follow-up.  He notes he has a few warts as well. He still gets chronic drainage. He's been having treatment for skin cancers on his face and back as well. Apparently the back skin cancer had a cyst at the base. He noted once that was unroofed and opened his scrotum and groins began to flareup again. He's been struggling to control his blood pressure has diabetes. Struggles with being able to afford his Lantus diabetic medication for his diabetes.   Last had cryoablation by me for warts 2018. Did not make a 6 month follow-up.  Did cryoablation on him 2017 for perianal and perineal condyloma warts.  Last operation by me was excision of condyloma and hemorrhoid surgery 10/24/2014 as noted below.  Laser ablation and removal of genital, perineal, perianal condyloma 2015      10/24/2014  4:31 PM  PATIENT: [name] 63 y.o. male  Patient Care Team: [name], MD as PCP - General Inda Castle, MD as Consulting Physician (Gastroenterology) Kathie Rhodes, MD as Consulting Physician (Urology) Wellington Hampshire,  MD as Consulting Physician (Cardiology) Michael Boston, MD as Consulting Physician (General Surgery)  PRE-OPERATIVE DIAGNOSIS: Condyloma perianal region, scrotal mass, groin ulcer  POST-OPERATIVE DIAGNOSIS:   Condyloma perianal/perineal region Scrotal abscess Perineal sinus with h/o hidradenitis suppurativa Internal hemorrhoids with intermittent and complete prolapse  PROCEDURE: Procedure(s): LASER ABLATION CONDOLAMATA EXCISION OF PERINEAL MASS/SINUS INTERNAL HEMORRHOIDECTOMY X 2 Examination under anesthesia Internal hemorrhoidal ligation and pexy X1  SURGEON: Surgeon(s): Michael Boston, MD  ANESTHESIA:   General anesthesia. Anorectal block using liposomal bupivacaine  EBL: Total I/O In: 900 [I.V.:900] Out: -  Delay start of Pharmacological VTE agent (>24hrs) due to surgical blood loss or risk of bleeding: NO  DRAINS: NONE  SPECIMEN: Source of Specimen: 1. Left groin/perineal sinus with condyloma. #2 internal hemorrhoids  DISPOSITION OF SPECIMEN: PATHOLOGY  COUNTS: YES  PLAN OF CARE: Discharge home after PACU  PATIENT DISPOSITION: PACU - hemodynamically stable.  INDICATION: Pleasant patient with struggles with hemorrhoids. Smoking male. History of condyloma status post ablation and excision the OR. Some recurrences well. History of hiIdradenitis suppurativa with numerous wide excisions and skin grafts in the distant past. 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 Urology has elected to not treat at this time. He has recurrent left groin and scrotal infections. Still does not quit smoking yet. Not able to be managed in the office despite an improved bowel regimen. I recommended examination under anesthesia and surgical treatment:  The anatomy & physiology of the anorectal region was discussed. The pathophysiology of hemorrhoids and differential diagnosis was discussed. Natural history risks without surgery was discussed. I stressed  the importance of a  bowel regimen to have daily soft bowel movements to minimize progression of disease. Interventions such as sclerotherapy & banding were discussed.  The patient's symptoms are not adequately controlled by medicines and other non-operative treatments. I feel the risks & problems of no surgery outweigh the operative risks; therefore, I recommended surgery to treat the hemorrhoids by ligation, pexy, and possible resection.  Risks such as bleeding, infection, need for further treatment, heart attack, death, and other risks were discussed. I noted a good likelihood this will help address the problem. Goals of post-operative recovery were discussed as well. Possibility that this will not correct all symptoms was explained. Post-operative pain, bleeding, constipation, urinary difficulties, and other problems after surgery were discussed. We will work to minimize complications. Educational handouts further explaining the pathology, treatment options, and bowel regimen were given as well. Questions were answered. The patient expresses understanding & wishes to proceed with surgery.  The anatomy & physiology of the anorectal region was discussed. The pathophysiology of anorectal warts and differential diagnosis was discussed. Natural history risks without surgery was discussed such as further growth and cancer. I stressed the importance of office follow-up to catch early recurrence & minimize/halt progression of disease. Interventions such as cauterization by topical agents were discussed.  The patient's symptoms are not adequately controlled by non-operative treatments. I feel the risks & problems of no surgery outweigh the operative risks; therefore, I recommended surgery to treat the anal warts by removal, ablation and/or cauterization.  Risks such as bleeding, infection, need for further treatment, heart attack, death, and other risks were discussed. I noted a good  likelihood this will help address the problem. Goals of post-operative recovery were discussed as well. Possibility that this will not correct all symptoms was explained. Post-operative pain, bleeding, constipation, and other problems after surgery were discussed. We will work to minimize complications. Educational handouts further explaining the pathology, treatment options, and bowel regimen were given as well. Questions were answered. The patient expresses understanding & wishes to proceed with surgery.   OR FINDINGS:  3 cm scrotal abscess. Purulent sent for culture.  1 x 1 x 2 cm chronic left perineal sinus with warts at crease between left scrotum and inner thigh. Excised and left open.  Internal hemorrhoids right anterior and posterior greater than left lateral. Right-sided partially prolapsing.  Recurrent perianal condylomata especially in the left posterior perianal region. None in anal canal.  DESCRIPTION:  Informed consent was confirmed. Patient underwent general anesthesia without difficulty. Patient was placed into prone positioning. The perianal region was prepped and draped in sterile fashion. Surgical time-out confirmed our plan.  I did digital rectal examination and then transitioned over to anoscopy to get a sense of the anatomy. Findings noted above.  I decided to excise the mass at the left scrotal/groin crease. Did a longitudinal elliptical incision mass excised. 2 x 2 centimeter room. Landed up extending it more superiorly and posteriorly and more widely. This lateral nice wide-open flat wound so would not re-fistulized.  Token 18-gauge needle through the most fluctuant area of the left scrotal mass. Aspirated centimeter of pus. Incised into it and bluntly opened up the mass and debrided it. I connected between the perineal wound in the scrotum with a umbilical tape later transitioned over to a blue vascular vessel tape. Left it as a seton to leave the  wounds open. Seton tied loosely with Ethibond suture.  Next I turned attention to the perianal region. He had obvious chronically prolapsed hemorrhoids on the  right anterior posterior aspects. I excised the external aspect of these hemorrhoids radially and came up into the hemorrhoid piles internally. I used a 2-0 Vicryl suture on a UR-6 needle in a figure-of-eight fashion 6 cm proximal to the anal verge. I then ran that stitch longitudinally more distally to close the hemorrhoidectomy wounds, leaving the last 5 mm open to allow natural drainage. I then tied that stitch down to cause a hemorrhoidopexy. I did that for the right anterior and posterior piles especially. I did ligation of the left lateral pile with some pexing as well with another 2-0 Vicryl suture. I redid anoscopy. At completion of this, all hemorrhoidal tissue remaining was reduced into the rectum. There is no prolapse.  I then used a CO2 laser to ablate some condyloma in the left inner thigh, base of scrotum, and perianal region. Anoscopy revealed no anal canal warts or masses. Hemostasis was good. I did acetic acid staining and saw no other abnormalities. External anatomy looked okay. He does have some chronic thickening and scarring but no major stricturing anywhere. I repeated anoscopy and examination. Hemostasis was good. Patient is being extubated go to go to the recovery room.  I had discussed postop care in detail with the patient in the preop holding area. Instructions for post-operative recovery and prescriptions are written. I did with his wife. STOP SMOKING! We talked to the patient & his wifeabout the dangers of smoking. We stressed that tobacco use dramatically increases the risk of peri-operative complications such as infection, tissue necrosis leaving to problems with incision/wound and organ healing, hernia, chronic pain, heart attack, stroke, DVT, pulmonary embolism, and death. We noted there are programs  in our community to help stop smoking. Information was available.   [name], M.D., F.A.C.S. Gastrointestinal and Minimally Invasive Surgery [facility] [address], [place], [place] [address] [phone] Main / Paging      03/20/2014  9:44 AM  PATIENT: [name] 63 y.o. male  Patient Care Team: [name], MD as PCP - General Inda Castle, MD as Consulting Physician (Gastroenterology) Claybon Jabs, MD as Consulting Physician (Urology)  PRE-OPERATIVE DIAGNOSIS: condolamata acuminatum of penis, groins, perianal region  POST-OPERATIVE DIAGNOSIS: condolamata acuminatum of penis, groins, perianal region  PROCEDURE: Procedure(s): EXAM UNDER ANESTHESIA, REMOVAL/ABLATION OF CONDYLOMATA PENIS,GROINS, ANUS, ANAL CANAL  SURGEON: Surgeon(s): [name], MD Claybon Jabs, MD  ANESTHESIA: local and general  EBL: Total I/O In: 1000 [I.V.:1000] Out: -  Delay start of Pharmacological VTE agent (>24hrs) due to surgical blood loss or risk of bleeding: no  DRAINS: none  SPECIMEN: Source of Specimen: Perianal warts/condylomata  DISPOSITION OF SPECIMEN: PATHOLOGY  COUNTS: YES  PLAN OF CARE: Discharge to home after PACU  PATIENT DISPOSITION: PACU - hemodynamically stable.  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 Plan: I recommended removal and/or ablation with laser. Coordinating with his urologist, Dr Karsten Ro, do it at the same time. The anatomy & physiology of the anorectal region was discussed. The pathophysiology of anorectal warts and differential diagnosis was discussed. Natural history risks without surgery was discussed such as further growth and cancer. I stressed the importance of office follow-up to catch early recurrence & minimize/halt progression of disease. Interventions such as cauterization by topical agents were discussed. The patient's symptoms are not  adequately controlled by non-operative treatments. I feel the risks & problems of no surgery outweigh the operative risks; therefore, I recommended surgery to treat the  anal warts by removal, ablation and/or cauterization. Risks such as bleeding, infection, need for further treatment, heart attack, death, and other risks were discussed. I noted a good likelihood this will help address the problem. Goals of post-operative recovery were discussed as well. Possibility that this will not correct all symptoms was explained. Post-operative pain, bleeding, constipation, and other problems after surgery were discussed. We will work to minimize complications. Educational handouts further explaining the pathology, treatment options, and bowel regimen were given as well. Questions were answered. The patient expresses understanding & wishes to proceed with surgery.   OR FINDINGS: Large volume of perianal condylomata. 2-30 mm in size. Pedunculated. Mostly soft. Primarily within 5 cm of the anal verge. A few scattered small condyloma in the distal anal canal. More firm flat sessile condylomata in left greater than right groins. Moderate volume under foreskin of the penis. See urology operative report.  DESCRIPTION:  Informed consent was confirmed. The patient underwent general anesthesia without difficulty. Patient was positioned in low lithotomy. Perineum and. A region prepped and draped in a sterile fashion. Surgical timeout confirmed plan.  Let Dr. Karsten Ro proceed with CO2 laser ablation of the penile and scrotal condylomata. He also ablated in the right greater than left lower groin is. Please see his note for details.  I took over the case. I went over the left groin and removed and CO2 ablated some of the larger condylomata. And focused in the perianal region. The giant condyloma were excised using needle tip cautery. I then proceeded to ablate the condylomata using a CO2 laser set from 7-15  W. I painted the area with acetic acid. I did not see any precancerous changes nor white skin changes of concern. I reinspected the entire region and ablated a small suspicious areas. I assured hemostasis.  The patient is being extubated go to recovery room. I discussed intraoperative findings with the patient's wife. She is familiar with his disease. I had a long frank discussion with her. Instructions are written. She expressed understanding and appreciation.   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.1) HYPERTENSION, ESSENTIAL (I10) Diabetes Mellitus ISCHEMIC REST PAIN OF LOWER EXTREMITY (I73.9) RLE ABDOMINAL PAIN, RIGHT LOWER QUADRANT (R10.31)  Past Surgical History [name], MD; 01/24/2019 11:57 AM) Gallbladder Surgery - Open ABLATION, CONDYLOMA, USING CO2 LASER (38756) [03/20/2014]:  Diagnostic Studies History [name], MD; 01/24/2019 11:57 AM) Colonoscopy 1-5 years ago  Allergies Nance Pew, CMA; 01/24/2019 11:53 AM) No Known Drug Allergies [04/20/2014]: Allergies Reconciled  Medication History (Sabrina Canty, CMA; 01/24/2019 11:54 AM) HumuLIN N (100UNIT/ML Suspension, Subcutaneous) Active. Doxycycline Hyclate (100MG  Tablet, 1 (one) Oral two times daily, Taken starting 12/28/2018) Active. Atorvastatin Calcium (20MG  Tablet, Oral) Active. Losartan Potassium-HCTZ (100-12.5MG  Tablet, Oral) Active. Albuterol Sulfate HFA (108 (90 Base)MCG/ACT Aerosol Soln, Inhalation) Active. Hyzaar (50-12.5MG  Tablet, Oral) Active. Lantus (100UNIT/ML Solution, 240 UNITS Subcutaneous) Active. Clarinex (5MG  Tablet, Oral) Active. Medications Reconciled  Social  History [name], MD; 01/24/2019 11:57 AM) Alcohol use Remotely quit alcohol use. Caffeine use Coffee. Illicit drug use Prefer to discuss with provider. Tobacco use Current every day smoker.  Family History [name], MD; 01/24/2019 11:57 AM) Alcohol Abuse Father, Mother. Hypertension Mother. Melanoma Father.  Other Problems [name], MD; 01/24/2019 11:57 AM) Bladder Problems  Cancer Unspecified Diagnosis    Vitals (Sabrina Canty CMA; 01/24/2019 11:55 AM) 01/24/2019 11:54 AM Weight: 263.5 lb Height: 68in Body Surface Area: 2.3 m Body Mass Index: 40.06 kg/m  Temp.: 97.16F(Temporal)  Pulse: 98 (Regular)  BP: 198/102 (Sitting, Left Arm, Standard)        Physical Exam [name]; 01/24/2019 12:31 PM)  General Mental Status-Alert. General Appearance-Not in acute distress. Voice-Normal.  Integumentary Global Assessment Normal Exam - Distribution of scalp and body hair is normal. General Characteristics Overall Skin Surface - no rashes and no suspicious lesions.  Head and Neck Head-normocephalic, atraumatic with no lesions or palpable masses. Face Global Assessment - atraumatic, no absence of expression. Neck Global Assessment - no abnormal movements, no decreased range of motion. Trachea-midline. Thyroid Gland Characteristics - non-tender.  Eye Eyeball - Left-Extraocular movements intact, No Nystagmus - Left. Eyeball - Right-Extraocular movements intact, No Nystagmus - Right. Upper Eyelid - Left-No Cyanotic - Left. Upper Eyelid - Right-No Cyanotic - Right.  ENMT Note: Waxy elevated lesions behind right ear. Most consistent with seborrheic keratoses. Not verrucous or fibrous has condyloma would be expected to be.  Chest and Lung Exam Inspection Accessory muscles - No use of accessory muscles in breathing.  Male Genitourinary Penis -Note:Giant soft tissue mass on the shaft of the  phallus consistent with chronic lymphedema - stable.  Scrotum -Note:small opening in crease between left thigh & scrotum. No drainage consistent with mild hidradenitis.  2 small warts left groin crease - T'x with podophyllin.  Note: On posterior lateral scrotum to the intergluteal region is a 3 mm opening with serous brown drainage. 3 x 3 cm thickening on posterior scrotum connecting with groin crease to thigh and gluteus consistent with chronic cyst collapsed. Condyloma 51mm on posterior inferior scrotum as well.   No active hidradenitis elsewhere. 5 mm condyloma on right inner thigh just above groin crease. RIGHT scrotal based scars clean & closed. LEFT upper groin and inner thigh creases with old scarring but stable and flat. Continues to have lipomatous swollen shaft of penis. Still 2x times usual with with prominent foreskin.  Rectal Anorectal Exam External - warts . Note: 4 small warts around a closed circumferential wound. Overall count much better than preop - T'x d with podophyllin. Note: held off on exam today  Peripheral Vascular Upper Extremity Inspection - Not Gangrenous, No Petechiae. Inspection - Not Gangrenous, No Petechiae.  Neurologic Neurologic evaluation reveals -normal attention span and ability to concentrate, able to name objects and repeat phrases. Appropriate fund of knowledge and normal coordination.  Neuropsychiatric Mental status exam performed with findings of-able to articulate well with normal speech/language, rate, volume and coherence and no evidence of hallucinations, delusions, obsessions or homicidal/suicidal ideation. Orientation-oriented X3.  Musculoskeletal Global Assessment Gait and Station - normal gait and station.  Lymphatic General Lymphatics Description - No Generalized lymphadenopathy.    Assessment & Plan [name]; 01/24/2019 12:30 PM)  HIDRADENITIS SUPPURATIVA (L73.2) Story: Hidradenitis suppurativa  s/p wide excisions/skin grafts of perineum in distant past complicated by poor lymphatic drainage & hydrophallus. Status post numerous excisions and unroofing's and chronic antibiotics. Impression: Patient with chronic severe hidradenitis suppurativa of groins and perineum and scrotum status post numerous excisions and unroofing's and chronic antibiotics. Latest flare on left posterior scrotum.  I do not think this will go away without removing the entire cavity. Leave it open to scar down flat. Palpation surgery in lithotomy. Too much to try and manage in the office.  This will allow me a chance to examine and remove any condyloma as well  Current Plans You are being scheduled for surgery- Our schedulers will call you.  You should hear from our office's scheduling department within 5 working days about the location, date, and time of surgery. We try to make accommodations for patient's preferences in scheduling surgery, but sometimes the OR schedule or the surgeon's schedule prevents [place] from making those accommodations.  If you have not heard from our office [phone]) in 5 working days, call the office and ask for your surgeon's nurse.  If you have other questions about your diagnosis, plan, or surgery, call the office and ask for your surgeon's nurse.  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 2 small perianal condylomas treated. One in LEFT groin treated. Verruca freeze nitrogen. Tolerated well..  Recommended surgeon followup physical exam schedule for condyloma/warts: -every Year until negative exam x2, then -as needed thereafter  Current Plans You are being scheduled for surgery- Our schedulers will call you.  You should hear from our office's scheduling department within 5 working days about  the location, date, and time of surgery. We try to make accommodations for patient's preferences in scheduling surgery, but sometimes the OR schedule or the surgeon's schedule prevents [place] from making those accommodations.  If you have not heard from our office [phone]) in 5 working days, call the office and ask for your surgeon's nurse.  If you have other questions about your diagnosis, plan, or surgery, call the office and ask for your surgeon's nurse.  Pt Education - [facility] Anal Warts (Josalin Carneiro) Surgical (Rectal exam) follow-up after resection of condyloma acuminatum (warts): - every 3 months until negative exam x1, then - every 6 months until negative exan x 1, then - every Year until negative exam x1, then  - as needed thereafter   HYPERTENSION, ESSENTIAL (I10) Impression: Elevated blood pressures today. Continue blood pressure medications. Consider following up with primary care physician for adjustments if needed.  [name], MD, FACS, MASCRS Gastrointestinal and Minimally Invasive Surgery    [address] [place], [place] [address] [phone] Main / Paging [phone] Fax

## 2019-01-25 NOTE — Telephone Encounter (Signed)
Called [facility], Lantus was confirmed at $627.87 through insurance.  I checked on other medications to see if covered,  Basaglar (3pen/90 day) not covered under insurance Toujeo 300 (90 day) $286.99 Toujeo Max 300 (90 day) $382.62  Will send to Dr Kelton Pillar as Dr Loanne Drilling is not in office - Nelva Nay looks like it will be the cheapest option- please advise. Thanks.

## 2019-01-26 NOTE — Telephone Encounter (Signed)
Patients wife stated that if we request the tier 1 for his medication that it would only cost $99. Also, informed he is down to his one box of insulin. Request a call back when completed.  Please Advise, Thanks

## 2019-01-28 NOTE — Telephone Encounter (Signed)
Allen @ [phone]. Spoke with Delsa Sale, Ref# [id number]. States tier exception request has been initiated and being sent to clinical review for decision. States we will receive approval/denial via fax. Will await response.

## 2019-01-31 NOTE — Telephone Encounter (Signed)
[id number]. Received notification from [employer] stating Lantus Solostar has been denied for an exception on the cost sharing tier. Letter place on Dr. Cordelia Pen desk for hism to review and address. Letter has also been mailed to pt.

## 2019-02-06 ENCOUNTER — Telehealth: Payer: Self-pay | Admitting: Endocrinology

## 2019-02-06 NOTE — Telephone Encounter (Signed)
please contact patient: Ins declined lantus.  Please take NPH, 240 units qam. I'll see you next time.

## 2019-02-07 ENCOUNTER — Telehealth: Payer: Self-pay | Admitting: Internal Medicine

## 2019-02-07 MED ORDER — LOSARTAN POTASSIUM 100 MG PO TABS: 100.0000 mg | ORAL_TABLET | Freq: Every day | ORAL | 1 refills | Status: DC

## 2019-02-07 NOTE — Telephone Encounter (Signed)
Called pt to make aware. States he purchased Lantus (5 pens) at full cost. Will complete the Lantus he has purchased, then will proceed to NPH as ordered.

## 2019-02-07 NOTE — Telephone Encounter (Signed)
OK 

## 2019-02-07 NOTE — Telephone Encounter (Signed)
Pt's wife has some questions about COVID-19 testing, and would like a call back.  Please call Marcie Bal: [phone]

## 2019-02-07 NOTE — Telephone Encounter (Signed)
Medication Refill - Medication: losartan (COZAAR) 100 MG tablet  Pt has been out of this medication for 10 days.  Pt's wife states rx may have been sent to previous provider.   Preferred Pharmacy:   [facility], [place] [phone] (Phone) [phone] (Fax)     Pt was advised that RX refills may take up to 3 business days. We ask that you follow-up with your pharmacy.

## 2019-02-08 ENCOUNTER — Other Ambulatory Visit: Payer: Self-pay

## 2019-02-08 NOTE — Telephone Encounter (Signed)
Patient's wife Marcie Bal called, left VM to return the call to [phone] with her questions about Covid testing.

## 2019-02-10 ENCOUNTER — Ambulatory Visit: Payer: Medicare Other | Admitting: Endocrinology

## 2019-03-10 ENCOUNTER — Other Ambulatory Visit: Payer: Self-pay | Admitting: Internal Medicine

## 2019-04-21 ENCOUNTER — Ambulatory Visit (INDEPENDENT_AMBULATORY_CARE_PROVIDER_SITE_OTHER): Payer: Medicare Other | Admitting: Internal Medicine

## 2019-04-21 ENCOUNTER — Encounter: Payer: Self-pay | Admitting: Internal Medicine

## 2019-04-21 ENCOUNTER — Other Ambulatory Visit: Payer: Self-pay | Admitting: Internal Medicine

## 2019-04-21 ENCOUNTER — Telehealth: Payer: Self-pay

## 2019-04-21 ENCOUNTER — Other Ambulatory Visit (INDEPENDENT_AMBULATORY_CARE_PROVIDER_SITE_OTHER): Payer: Medicare Other

## 2019-04-21 ENCOUNTER — Other Ambulatory Visit: Payer: Self-pay

## 2019-04-21 VITALS — BP 136/86 | HR 85 | Temp 98.0°F | Ht 68.0 in | Wt 258.0 lb

## 2019-04-21 DIAGNOSIS — M7021 Olecranon bursitis, right elbow: Secondary | ICD-10-CM | POA: Diagnosis not present

## 2019-04-21 DIAGNOSIS — E559 Vitamin D deficiency, unspecified: Secondary | ICD-10-CM | POA: Diagnosis not present

## 2019-04-21 DIAGNOSIS — E1151 Type 2 diabetes mellitus with diabetic peripheral angiopathy without gangrene: Secondary | ICD-10-CM | POA: Diagnosis not present

## 2019-04-21 DIAGNOSIS — E611 Iron deficiency: Secondary | ICD-10-CM | POA: Diagnosis not present

## 2019-04-21 DIAGNOSIS — E785 Hyperlipidemia, unspecified: Secondary | ICD-10-CM

## 2019-04-21 DIAGNOSIS — Z23 Encounter for immunization: Secondary | ICD-10-CM

## 2019-04-21 DIAGNOSIS — Z0001 Encounter for general adult medical examination with abnormal findings: Secondary | ICD-10-CM

## 2019-04-21 DIAGNOSIS — I10 Essential (primary) hypertension: Secondary | ICD-10-CM

## 2019-04-21 DIAGNOSIS — E538 Deficiency of other specified B group vitamins: Secondary | ICD-10-CM

## 2019-04-21 DIAGNOSIS — S81811D Laceration without foreign body, right lower leg, subsequent encounter: Secondary | ICD-10-CM

## 2019-04-21 DIAGNOSIS — Z794 Long term (current) use of insulin: Secondary | ICD-10-CM

## 2019-04-21 LAB — CBC WITH DIFFERENTIAL/PLATELET
Basophils Absolute: 0 10*3/uL (ref 0.0–0.1)
Basophils Relative: 0.3 % (ref 0.0–3.0)
Eosinophils Absolute: 0.6 10*3/uL (ref 0.0–0.7)
Eosinophils Relative: 4.9 % (ref 0.0–5.0)
HCT: 41.7 % (ref 39.0–52.0)
Hemoglobin: 14.4 g/dL (ref 13.0–17.0)
Lymphocytes Relative: 24.4 % (ref 12.0–46.0)
Lymphs Abs: 2.9 10*3/uL (ref 0.7–4.0)
MCHC: 34.5 g/dL (ref 30.0–36.0)
MCV: 91.2 fl (ref 78.0–100.0)
Monocytes Absolute: 1 10*3/uL (ref 0.1–1.0)
Monocytes Relative: 8.7 % (ref 3.0–12.0)
Neutro Abs: 7.4 10*3/uL (ref 1.4–7.7)
Neutrophils Relative %: 61.7 % (ref 43.0–77.0)
Platelets: 344 10*3/uL (ref 150.0–400.0)
RBC: 4.56 Mil/uL (ref 4.22–5.81)
RDW: 12.9 % (ref 11.5–15.5)
WBC: 12 10*3/uL — ABNORMAL HIGH (ref 4.0–10.5)

## 2019-04-21 LAB — MICROALBUMIN / CREATININE URINE RATIO
Creatinine,U: 157.6 mg/dL
Microalb Creat Ratio: 54 mg/g — ABNORMAL HIGH (ref 0.0–30.0)
Microalb, Ur: 85.2 mg/dL — ABNORMAL HIGH (ref 0.0–1.9)

## 2019-04-21 LAB — IBC PANEL
Iron: 102 ug/dL (ref 42–165)
Saturation Ratios: 36.2 % (ref 20.0–50.0)
Transferrin: 201 mg/dL — ABNORMAL LOW (ref 212.0–360.0)

## 2019-04-21 LAB — LIPID PANEL
Cholesterol: 100 mg/dL (ref 0–200)
HDL: 28.1 mg/dL — ABNORMAL LOW (ref >39.00)
LDL Cholesterol: 48 mg/dL (ref 0–99)
NonHDL: 72.1
Total CHOL/HDL Ratio: 4
Triglycerides: 121 mg/dL (ref 0.0–149.0)
VLDL: 24.2 mg/dL (ref 0.0–40.0)

## 2019-04-21 LAB — HEPATIC FUNCTION PANEL
ALT: 35 U/L (ref 0–53)
AST: 23 U/L (ref 0–37)
Albumin: 3.9 g/dL (ref 3.5–5.2)
Alkaline Phosphatase: 81 U/L (ref 39–117)
Bilirubin, Direct: 0.1 mg/dL (ref 0.0–0.3)
Total Bilirubin: 0.5 mg/dL (ref 0.2–1.2)
Total Protein: 7.2 g/dL (ref 6.0–8.3)

## 2019-04-21 LAB — BASIC METABOLIC PANEL
BUN: 14 mg/dL (ref 6–23)
CO2: 28 mEq/L (ref 19–32)
Calcium: 9.2 mg/dL (ref 8.4–10.5)
Chloride: 103 mEq/L (ref 96–112)
Creatinine, Ser: 1.12 mg/dL (ref 0.40–1.50)
GFR: 66.32 mL/min (ref >60.00)
Glucose, Bld: 201 mg/dL — ABNORMAL HIGH (ref 70–99)
Potassium: 3.8 mEq/L (ref 3.5–5.1)
Sodium: 139 mEq/L (ref 135–145)

## 2019-04-21 LAB — URINALYSIS, ROUTINE W REFLEX MICROSCOPIC
Bilirubin Urine: NEGATIVE
Leukocytes,Ua: NEGATIVE
Nitrite: NEGATIVE
RBC / HPF: NONE SEEN (ref 0–?)
Specific Gravity, Urine: >=1.03 — AB (ref 1.000–1.030)
Total Protein, Urine: 100 — AB
Urine Glucose: 250 — AB
Urobilinogen, UA: 1 (ref 0.0–1.0)
pH: 5 (ref 5.0–8.0)

## 2019-04-21 LAB — VITAMIN D 25 HYDROXY (VIT D DEFICIENCY, FRACTURES): VITD: 23.88 ng/mL — ABNORMAL LOW (ref 30.00–100.00)

## 2019-04-21 LAB — PSA: PSA: 3.27 ng/mL (ref 0.10–4.00)

## 2019-04-21 LAB — HEMOGLOBIN A1C: Hgb A1c MFr Bld: 8.7 % — ABNORMAL HIGH (ref 4.6–6.5)

## 2019-04-21 LAB — TSH: TSH: 1.56 u[IU]/mL (ref 0.35–4.50)

## 2019-04-21 LAB — VITAMIN B12: Vitamin B-12: 433 pg/mL (ref 211–911)

## 2019-04-21 MED ORDER — METFORMIN HCL ER 500 MG PO TB24: ORAL_TABLET | ORAL | 3 refills | Status: DC

## 2019-04-21 MED ORDER — LOSARTAN POTASSIUM 100 MG PO TABS: 100.0000 mg | ORAL_TABLET | Freq: Every day | ORAL | 3 refills | Status: DC

## 2019-04-21 MED ORDER — PANTOPRAZOLE SODIUM 40 MG PO TBEC: 40.0000 mg | DELAYED_RELEASE_TABLET | Freq: Every day | ORAL | 3 refills | Status: DC

## 2019-04-21 MED ORDER — AMLODIPINE BESYLATE 2.5 MG PO TABS: 2.5000 mg | ORAL_TABLET | Freq: Every day | ORAL | 3 refills | Status: DC

## 2019-04-21 MED ORDER — HYDROCHLOROTHIAZIDE 25 MG PO TABS: 25.0000 mg | ORAL_TABLET | Freq: Every day | ORAL | 3 refills | Status: DC

## 2019-04-21 MED ORDER — ATORVASTATIN CALCIUM 20 MG PO TABS: 20.0000 mg | ORAL_TABLET | Freq: Every day | ORAL | 3 refills | Status: DC

## 2019-04-21 MED ORDER — VITAMIN D (ERGOCALCIFEROL) 1.25 MG (50000 UNIT) PO CAPS: 50000.0000 [IU] | ORAL_CAPSULE | ORAL | 0 refills | Status: DC

## 2019-04-21 NOTE — Assessment & Plan Note (Addendum)
Mild, asympt, d/w pt natural hx, no specific tx  In addition to the time spent performing CPE, I spent an additional 25 minutes face to face,in which greater than 50% of this time was spent in counseling and coordination of care for patient's acute illness as documented, including the differential dx, treatment, further evaluation and other management of right elbow bursitis, right leg abrasion/laceration, DM, HTN, HLD

## 2019-04-21 NOTE — Assessment & Plan Note (Signed)
stable overall by history and exam, recent data reviewed with pt, and pt to continue medical treatment as before,  to f/u any worsening symptoms or concerns  

## 2019-04-21 NOTE — Telephone Encounter (Signed)
-----   Message from Biagio Borg, MD sent at 04/21/2019 12:54 PM EDT ----- Left message on MyChart, pt to cont same tx except  The test results show that your current treatment is OK, as the tests are stable, except the Vitamin D level is low.  Please take Vitamin D 50000 units weekly for 12 weeks, then plan to change to OTC Vitamin D3 at 2000 units per day, indefinitely.  Please also make sure to follow up with Dr Loanne Drilling as you have planned.  Chelcie Estorga to please inform pt, I will do rx

## 2019-04-21 NOTE — Patient Instructions (Addendum)

## 2019-04-21 NOTE — Assessment & Plan Note (Signed)

## 2019-04-21 NOTE — Assessment & Plan Note (Signed)
With mild abrasion right pretibial this time only, ok for neosporin, gauze until healed

## 2019-04-21 NOTE — Progress Notes (Signed)
Subjective:    Patient ID: [name], male    DOB: 02/28/1956, 63 y.o.   MRN: [id number]  HPI  Here for wellness and f/u;  Overall doing ok;  Pt denies Chest pain, worsening SOB, DOE, wheezing, orthopnea, PND, worsening LE edema, palpitations, dizziness or syncope.  Pt denies neurological change such as new headache, facial or extremity weakness.  Pt denies polydipsia, polyuria, or low sugar symptoms. Pt states overall good compliance with treatment and medications, good tolerability, and has been trying to follow appropriate diet.  Pt denies worsening depressive symptoms, suicidal ideation or panic. No fever, night sweats, wt loss, loss of appetite, or other constitutional symptoms.  Pt states good ability with ADL's, has low fall risk, home safety reviewed and adequate, no other significant changes in hearing or vision, and only occasionally active with exercise. Does a large non cellulitic area entire right pretibial abrasion like after stepping through a stair that broke recently.  No worsening red, tender, swelling, drainageo ro fever Also has 1 yr right olecranon swelling with minor discomfort, constant, nothing seems to make better or worse.  No specific trauma he can recall. Sees endo for DM. Past Medical History:  Diagnosis Date  . Allergic rhinitis   . At risk for sleep apnea    STOP-BANG= 5   SENT TO PCP 03-14-2014  . Cataract    surgically removed bilateral  . Condyloma acuminatum of penis   . Diabetic neuropathy ([place])   . GERD (gastroesophageal reflux disease)   . History of bladder cancer    s/p  turbt  2013/   transitional cell carcinoma--   . History of condyloma acuminatum    PERINEAL AREA  W/ RECURRENCY  . History of gout   . Hyperlipidemia   . Hypertension   . Lower urinary tract symptoms (LUTS)   . Productive cough   . PVD (peripheral vascular disease) with claudication ([facility])    bilateral SFA disease-- right > left  and left tibial artery disease--  per duplex  .  Smokers' cough ([place])   . Type 2 diabetes mellitus with insulin therapy ([place])    monitor by  dr ellsion  . Wears dentures    upper   Past Surgical History:  Procedure Laterality Date  . AXILLARY HIDRADENITIS EXCISION  1997  . CARDIOVASCULAR STRESS TEST  07-24-2014  dr Kathlyn [place]   Low risk lexiscan nuclear study with apical thinning and small inferolateral wall infarct at mid & basal level , no ischemia/  normal LVF and wall motion , ef 59%  . CATARACT EXTRACTION Left   . CATARACT EXTRACTION W/ INTRAOCULAR LENS IMPLANT Right   . CO2 LASER APPLICATION N/A [id number]   Procedure: CO2 LASER APPLICATION,PENIS, GROIN, ANUS;  Surgeon: [name], MD;  Location: [facility];  Service: General;  Laterality: N/A;  . CO2 LASER APPLICATION N/A [id number]   Procedure: CO2 LASER APPLICATION;  Surgeon: Kathie Rhodes, MD;  Location: [facility];  Service: Urology;  Laterality: N/A;  . CONDYLOMA EXCISION/FULGURATION N/A 05/21/2015   Procedure: CONDYLOMA REMOVAL;  Surgeon: Kathie Rhodes, MD;  Location: [facility];  Service: Urology;  Laterality: N/A;  . HEMORRHOID SURGERY  10/24/2014   Procedure: HEMORRHOIDECTOMY;  Surgeon: Michael Boston, MD;  Location: [facility];  Service: General;;  . INCISION AND DRAINAGE ABSCESS Left 10/24/2014   Procedure: INCISION AND DRAINAGE ABSCESS;  Surgeon: Michael Boston, MD;  Location: [facility];  Service:  General;  Laterality: Left;  . INGUINAL HIDRADENITIS EXCISION  1998, 1999  . LASER ABLATION CONDOLAMATA N/A 03/20/2014   Procedure: EXAM UNDER ANESTHESIA, REMOVAL/ABLATION OF CONDYLOMATA PENIS,GROINS, ANUS, ANAL CANAL;  Surgeon: [name], MD;  Location: [facility];  Service: General;  Laterality: N/A;  groin and anus  . LASER ABLATION CONDOLAMATA N/A 10/24/2014   Procedure: LASER ABLATION CONDOLAMATA;  Surgeon: Michael Boston, MD;  Location: [facility];   Service: General;  Laterality: N/A;  . LASER ABLATION OF PENILE AND PERIANAL WARTS  07-29-2007  Dr. Johney Maine  . LEFT SHOULDER SURGERY  2003  . MASS EXCISION N/A 10/24/2014   Procedure: EXCISION OF PERINEAL MASS/SINUS;  Surgeon: Michael Boston, MD;  Location: [facility];  Service: General;  Laterality: N/A;  . MOHS SURGERY     back  . MOHS SURGERY  2017   face  . MULTIPLE TOOTH EXTRACTIONS    . PERINEAL HIDRADENITIS EXCISION  1998, 1999  . TRANSURETHRAL RESECTION OF BLADDER TUMOR  05/21/2012   Procedure: TRANSURETHRAL RESECTION OF BLADDER TUMOR (TURBT);  Surgeon: Claybon Jabs, MD;  Location: [facility];  Service: Urology;  Laterality: N/A;       reports that he quit smoking about 3 years ago. His smoking use included cigars and cigarettes. He has a 41.00 pack-year smoking history. He has never used smokeless tobacco. He reports that he does not drink alcohol or use drugs. family history includes Cancer in his father; Diabetes in his maternal aunt; Hypertension in his mother. No Known Allergies Current Outpatient Medications on File Prior to Visit  Medication Sig Dispense Refill  . amLODipine (NORVASC) 2.5 MG tablet TAKE 1 TABLET BY MOUTH  DAILY 90 tablet 3  . aspirin EC 81 MG tablet Take 1 tablet (81 mg total) by mouth daily.    Marland Kitchen atorvastatin (LIPITOR) 20 MG tablet TAKE 1 TABLET BY MOUTH  DAILY 90 tablet 3  . hydrochlorothiazide (HYDRODIURIL) 25 MG tablet Take 1 tablet (25 mg total) by mouth daily. 90 tablet 3  . Insulin Glargine (LANTUS SOLOSTAR) 100 UNIT/ML Solostar Pen Inject 250 Units into the skin every morning. (Patient taking differently: Inject 240 Units into the skin every morning. ) 90 pen PRN  . losartan (COZAAR) 100 MG tablet Take 1 tablet (100 mg total) by mouth daily. 90 tablet 1  . metFORMIN (GLUCOPHAGE-XR) 500 MG 24 hr tablet Take 1 tablet by mouth once daily with breakfast 90 tablet 0  . pantoprazole (PROTONIX) 40 MG tablet Take 1 tablet (40 mg  total) by mouth daily. 90 tablet 3  . potassium chloride 20 MEQ TBCR Take 20 mEq by mouth daily. 30 tablet 6   No current facility-administered medications on file prior to visit.    Review of Systems Constitutional: Negative for other unusual diaphoresis, sweats, appetite or weight changes HENT: Negative for other worsening hearing loss, ear pain, facial swelling, mouth sores or neck stiffness.   Eyes: Negative for other worsening pain, redness or other visual disturbance.  Respiratory: Negative for other stridor or swelling Cardiovascular: Negative for other palpitations or other chest pain  Gastrointestinal: Negative for worsening diarrhea or loose stools, blood in stool, distention or other pain Genitourinary: Negative for hematuria, flank pain or other change in urine volume.  Musculoskeletal: Negative for myalgias or other joint swelling.  Skin: Negative for other color change, or other wound or worsening drainage.  Neurological: Negative for other syncope or numbness. Hematological: Negative for other adenopathy  or swelling Psychiatric/Behavioral: Negative for hallucinations, other worsening agitation, SI, self-injury, or new decreased concentration All other system neg pe rpt    Objective:   Physical Exam BP 136/86   Pulse 85   Temp 98 F (36.7 C) (Oral)   Ht 5\' 8"  (1.727 m)   Wt 258 lb (117 kg)   SpO2 98%   BMI 39.23 kg/m  VS noted,  Constitutional: Pt is oriented to person, place, and time. Appears well-developed and well-nourished, in no significant distress and comfortable Head: Normocephalic and atraumatic  Eyes: Conjunctivae and EOM are normal. Pupils are equal, round, and reactive to light Right Ear: External ear normal without discharge Left Ear: External ear normal without discharge Nose: Nose without discharge or deformity Mouth/Throat: Oropharynx is without other ulcerations and moist  Neck: Normal range of motion. Neck supple. No JVD present. No tracheal  deviation present or significant neck LA or mass Cardiovascular: Normal rate, regular rhythm, normal heart sounds and intact distal pulses.   Pulmonary/Chest: WOB normal and breath sounds without rales or wheezing  Abdominal: Soft. Bowel sounds are normal. NT. No HSM  Musculoskeletal: Normal range of motion. Exhibits no edema. Right olecranon with 1-2+ bursa swelling nontender Lymphadenopathy: Has no other cervical adenopathy.  Neurological: Pt is alert and oriented to person, place, and time. Pt has normal reflexes. No cranial nerve deficit. Motor grossly intact, Gait intact Skin: Skin is warm and dry. No rash noted or new ulcerations except for abrasion to right pretibial. Psychiatric:  Has normal mood and affect. Behavior is normal without agitation No other exam findings Lab Results  Component Value Date   WBC 11.1 (H) 06/15/2018   HGB 15.8 06/15/2018   HCT 44.7 06/15/2018   PLT 307.0 06/15/2018   GLUCOSE 121 (H) 06/15/2018   CHOL 120 06/15/2018   TRIG 256.0 (H) 06/15/2018   HDL 30.80 (L) 06/15/2018   LDLDIRECT 67.0 06/15/2018   LDLCALC 34 03/29/2018   ALT 50 06/15/2018   AST 35 06/15/2018   NA 139 06/15/2018   K 3.8 06/15/2018   CL 100 06/15/2018   CREATININE 1.16 06/15/2018   BUN 13 06/15/2018   CO2 31 06/15/2018   TSH 2.89 06/15/2018   PSA 1.73 06/15/2018   HGBA1C 9.3 (A) 09/09/2018   MICROALBUR 5.6 (H) 01/08/2017      Assessment & Plan:

## 2019-04-21 NOTE — Telephone Encounter (Signed)
Called pt, LVM.   CRM created.  

## 2019-06-02 DIAGNOSIS — Z8551 Personal history of malignant neoplasm of bladder: Secondary | ICD-10-CM | POA: Diagnosis not present

## 2019-06-04 ENCOUNTER — Other Ambulatory Visit: Payer: Self-pay

## 2019-06-04 ENCOUNTER — Emergency Department (HOSPITAL_COMMUNITY): Payer: Medicare Other

## 2019-06-04 ENCOUNTER — Inpatient Hospital Stay (HOSPITAL_COMMUNITY)
Admission: EM | Admit: 2019-06-04 | Discharge: 2019-06-09 | DRG: 872 | Disposition: A | Discharge status: Home / Self Care | Payer: Medicare Other | Attending: Internal Medicine | Admitting: Internal Medicine

## 2019-06-04 DIAGNOSIS — L270 Generalized skin eruption due to drugs and medicaments taken internally: Secondary | ICD-10-CM | POA: Diagnosis not present

## 2019-06-04 DIAGNOSIS — Z794 Long term (current) use of insulin: Secondary | ICD-10-CM | POA: Diagnosis not present

## 2019-06-04 DIAGNOSIS — A419 Sepsis, unspecified organism: Secondary | ICD-10-CM | POA: Diagnosis not present

## 2019-06-04 DIAGNOSIS — N12 Tubulo-interstitial nephritis, not specified as acute or chronic: Secondary | ICD-10-CM | POA: Diagnosis present

## 2019-06-04 DIAGNOSIS — I5032 Chronic diastolic (congestive) heart failure: Secondary | ICD-10-CM | POA: Diagnosis present

## 2019-06-04 DIAGNOSIS — E1140 Type 2 diabetes mellitus with diabetic neuropathy, unspecified: Secondary | ICD-10-CM | POA: Diagnosis not present

## 2019-06-04 DIAGNOSIS — I25118 Atherosclerotic heart disease of native coronary artery with other forms of angina pectoris: Secondary | ICD-10-CM | POA: Diagnosis not present

## 2019-06-04 DIAGNOSIS — M109 Gout, unspecified: Secondary | ICD-10-CM | POA: Diagnosis not present

## 2019-06-04 DIAGNOSIS — Z7982 Long term (current) use of aspirin: Secondary | ICD-10-CM

## 2019-06-04 DIAGNOSIS — L0591 Pilonidal cyst without abscess: Secondary | ICD-10-CM | POA: Diagnosis not present

## 2019-06-04 DIAGNOSIS — R778 Other specified abnormalities of plasma proteins: Secondary | ICD-10-CM | POA: Diagnosis not present

## 2019-06-04 DIAGNOSIS — I739 Peripheral vascular disease, unspecified: Secondary | ICD-10-CM | POA: Diagnosis not present

## 2019-06-04 DIAGNOSIS — I110 Hypertensive heart disease with heart failure: Secondary | ICD-10-CM | POA: Diagnosis present

## 2019-06-04 DIAGNOSIS — K760 Fatty (change of) liver, not elsewhere classified: Secondary | ICD-10-CM | POA: Diagnosis not present

## 2019-06-04 DIAGNOSIS — R7989 Other specified abnormal findings of blood chemistry: Secondary | ICD-10-CM | POA: Diagnosis not present

## 2019-06-04 DIAGNOSIS — I471 Supraventricular tachycardia: Secondary | ICD-10-CM | POA: Diagnosis not present

## 2019-06-04 DIAGNOSIS — N3289 Other specified disorders of bladder: Secondary | ICD-10-CM | POA: Diagnosis not present

## 2019-06-04 DIAGNOSIS — E785 Hyperlipidemia, unspecified: Secondary | ICD-10-CM | POA: Diagnosis not present

## 2019-06-04 DIAGNOSIS — E669 Obesity, unspecified: Secondary | ICD-10-CM | POA: Diagnosis present

## 2019-06-04 DIAGNOSIS — I160 Hypertensive urgency: Secondary | ICD-10-CM | POA: Diagnosis present

## 2019-06-04 DIAGNOSIS — I2510 Atherosclerotic heart disease of native coronary artery without angina pectoris: Secondary | ICD-10-CM | POA: Diagnosis present

## 2019-06-04 DIAGNOSIS — J309 Allergic rhinitis, unspecified: Secondary | ICD-10-CM | POA: Diagnosis present

## 2019-06-04 DIAGNOSIS — E876 Hypokalemia: Secondary | ICD-10-CM | POA: Diagnosis not present

## 2019-06-04 DIAGNOSIS — I248 Other forms of acute ischemic heart disease: Secondary | ICD-10-CM | POA: Diagnosis present

## 2019-06-04 DIAGNOSIS — I700 Atherosclerosis of aorta: Secondary | ICD-10-CM | POA: Diagnosis present

## 2019-06-04 DIAGNOSIS — I10 Essential (primary) hypertension: Secondary | ICD-10-CM | POA: Diagnosis present

## 2019-06-04 DIAGNOSIS — K219 Gastro-esophageal reflux disease without esophagitis: Secondary | ICD-10-CM | POA: Diagnosis present

## 2019-06-04 DIAGNOSIS — R21 Rash and other nonspecific skin eruption: Secondary | ICD-10-CM | POA: Diagnosis not present

## 2019-06-04 DIAGNOSIS — E119 Type 2 diabetes mellitus without complications: Secondary | ICD-10-CM

## 2019-06-04 DIAGNOSIS — J9811 Atelectasis: Secondary | ICD-10-CM | POA: Diagnosis present

## 2019-06-04 DIAGNOSIS — R000 Tachycardia, unspecified: Secondary | ICD-10-CM | POA: Diagnosis not present

## 2019-06-04 DIAGNOSIS — Z79899 Other long term (current) drug therapy: Secondary | ICD-10-CM

## 2019-06-04 DIAGNOSIS — E11649 Type 2 diabetes mellitus with hypoglycemia without coma: Secondary | ICD-10-CM | POA: Diagnosis present

## 2019-06-04 DIAGNOSIS — A4151 Sepsis due to Escherichia coli [E. coli]: Secondary | ICD-10-CM | POA: Diagnosis not present

## 2019-06-04 DIAGNOSIS — J410 Simple chronic bronchitis: Secondary | ICD-10-CM | POA: Diagnosis present

## 2019-06-04 DIAGNOSIS — J45909 Unspecified asthma, uncomplicated: Secondary | ICD-10-CM | POA: Diagnosis present

## 2019-06-04 DIAGNOSIS — Z87891 Personal history of nicotine dependence: Secondary | ICD-10-CM

## 2019-06-04 DIAGNOSIS — R0602 Shortness of breath: Secondary | ICD-10-CM | POA: Diagnosis not present

## 2019-06-04 DIAGNOSIS — Z8249 Family history of ischemic heart disease and other diseases of the circulatory system: Secondary | ICD-10-CM

## 2019-06-04 DIAGNOSIS — R197 Diarrhea, unspecified: Secondary | ICD-10-CM | POA: Diagnosis not present

## 2019-06-04 DIAGNOSIS — E1151 Type 2 diabetes mellitus with diabetic peripheral angiopathy without gangrene: Secondary | ICD-10-CM | POA: Diagnosis present

## 2019-06-04 DIAGNOSIS — R609 Edema, unspecified: Secondary | ICD-10-CM | POA: Diagnosis not present

## 2019-06-04 DIAGNOSIS — Z8551 Personal history of malignant neoplasm of bladder: Secondary | ICD-10-CM

## 2019-06-04 DIAGNOSIS — Z6839 Body mass index (BMI) 39.0-39.9, adult: Secondary | ICD-10-CM

## 2019-06-04 DIAGNOSIS — T50995A Adverse effect of other drugs, medicaments and biological substances, initial encounter: Secondary | ICD-10-CM | POA: Diagnosis not present

## 2019-06-04 DIAGNOSIS — Z833 Family history of diabetes mellitus: Secondary | ICD-10-CM

## 2019-06-04 DIAGNOSIS — E0800 Diabetes mellitus due to underlying condition with hyperosmolarity without nonketotic hyperglycemic-hyperosmolar coma (NKHHC): Secondary | ICD-10-CM | POA: Diagnosis not present

## 2019-06-04 DIAGNOSIS — C679 Malignant neoplasm of bladder, unspecified: Secondary | ICD-10-CM | POA: Diagnosis not present

## 2019-06-04 DIAGNOSIS — R0689 Other abnormalities of breathing: Secondary | ICD-10-CM | POA: Diagnosis not present

## 2019-06-04 DIAGNOSIS — R0789 Other chest pain: Secondary | ICD-10-CM | POA: Diagnosis not present

## 2019-06-04 DIAGNOSIS — Z20828 Contact with and (suspected) exposure to other viral communicable diseases: Secondary | ICD-10-CM | POA: Diagnosis present

## 2019-06-04 DIAGNOSIS — Z801 Family history of malignant neoplasm of trachea, bronchus and lung: Secondary | ICD-10-CM

## 2019-06-04 DIAGNOSIS — K5730 Diverticulosis of large intestine without perforation or abscess without bleeding: Secondary | ICD-10-CM | POA: Diagnosis not present

## 2019-06-04 DIAGNOSIS — N390 Urinary tract infection, site not specified: Secondary | ICD-10-CM | POA: Diagnosis not present

## 2019-06-04 LAB — CBC WITH DIFFERENTIAL/PLATELET
Abs Immature Granulocytes: 0.26 10*3/uL — ABNORMAL HIGH (ref 0.00–0.07)
Basophils Absolute: 0.1 10*3/uL (ref 0.0–0.1)
Basophils Relative: 0 %
Eosinophils Absolute: 0 10*3/uL (ref 0.0–0.5)
Eosinophils Relative: 0 %
HCT: 40.7 % (ref 39.0–52.0)
Hemoglobin: 14.1 g/dL (ref 13.0–17.0)
Immature Granulocytes: 1 %
Lymphocytes Relative: 6 %
Lymphs Abs: 1.3 10*3/uL (ref 0.7–4.0)
MCH: 31.8 pg (ref 26.0–34.0)
MCHC: 34.6 g/dL (ref 30.0–36.0)
MCV: 91.7 fL (ref 80.0–100.0)
Monocytes Absolute: 1.4 10*3/uL — ABNORMAL HIGH (ref 0.1–1.0)
Monocytes Relative: 7 %
Neutro Abs: 18 10*3/uL — ABNORMAL HIGH (ref 1.7–7.7)
Neutrophils Relative %: 86 %
Platelets: 218 10*3/uL (ref 150–400)
RBC: 4.44 MIL/uL (ref 4.22–5.81)
RDW: 12.5 % (ref 11.5–15.5)
WBC: 21 10*3/uL — ABNORMAL HIGH (ref 4.0–10.5)
nRBC: 0 % (ref 0.0–0.2)

## 2019-06-04 LAB — I-STAT CHEM 8, ED
BUN: 19 mg/dL (ref 8–23)
Calcium, Ion: 1.03 mmol/L — ABNORMAL LOW (ref 1.15–1.40)
Chloride: 98 mmol/L (ref 98–111)
Creatinine, Ser: 1.6 mg/dL — ABNORMAL HIGH (ref 0.61–1.24)
Glucose, Bld: 96 mg/dL (ref 70–99)
HCT: 39 % (ref 39.0–52.0)
Hemoglobin: 13.3 g/dL (ref 13.0–17.0)
Potassium: 2.5 mmol/L — CL (ref 3.5–5.1)
Sodium: 137 mmol/L (ref 135–145)
TCO2: 25 mmol/L (ref 22–32)

## 2019-06-04 NOTE — ED Triage Notes (Signed)
Pt arriving via [facility] from home with increased shortness of breath and fever. Pt O2 sats were 91% on room air upon EMS assessment. Pt has hx of bladder cancer, has drainage sites in groin area. Pt had temp of 100.9, received 146mL saline via EMS prior to arrival. Tachycardic in 120's

## 2019-06-05 ENCOUNTER — Inpatient Hospital Stay (HOSPITAL_COMMUNITY): Payer: Medicare Other

## 2019-06-05 ENCOUNTER — Encounter (HOSPITAL_COMMUNITY): Payer: Self-pay

## 2019-06-05 ENCOUNTER — Emergency Department (HOSPITAL_COMMUNITY): Payer: Medicare Other

## 2019-06-05 DIAGNOSIS — D72829 Elevated white blood cell count, unspecified: Secondary | ICD-10-CM | POA: Diagnosis not present

## 2019-06-05 DIAGNOSIS — E1140 Type 2 diabetes mellitus with diabetic neuropathy, unspecified: Secondary | ICD-10-CM | POA: Diagnosis present

## 2019-06-05 DIAGNOSIS — Z794 Long term (current) use of insulin: Secondary | ICD-10-CM | POA: Diagnosis not present

## 2019-06-05 DIAGNOSIS — I25118 Atherosclerotic heart disease of native coronary artery with other forms of angina pectoris: Secondary | ICD-10-CM | POA: Diagnosis not present

## 2019-06-05 DIAGNOSIS — I248 Other forms of acute ischemic heart disease: Secondary | ICD-10-CM

## 2019-06-05 DIAGNOSIS — E876 Hypokalemia: Secondary | ICD-10-CM

## 2019-06-05 DIAGNOSIS — K219 Gastro-esophageal reflux disease without esophagitis: Secondary | ICD-10-CM | POA: Diagnosis present

## 2019-06-05 DIAGNOSIS — I110 Hypertensive heart disease with heart failure: Secondary | ICD-10-CM | POA: Diagnosis present

## 2019-06-05 DIAGNOSIS — A419 Sepsis, unspecified organism: Secondary | ICD-10-CM | POA: Diagnosis not present

## 2019-06-05 DIAGNOSIS — B9620 Unspecified Escherichia coli [E. coli] as the cause of diseases classified elsewhere: Secondary | ICD-10-CM | POA: Diagnosis not present

## 2019-06-05 DIAGNOSIS — N12 Tubulo-interstitial nephritis, not specified as acute or chronic: Secondary | ICD-10-CM | POA: Diagnosis not present

## 2019-06-05 DIAGNOSIS — J9811 Atelectasis: Secondary | ICD-10-CM | POA: Diagnosis not present

## 2019-06-05 DIAGNOSIS — R197 Diarrhea, unspecified: Secondary | ICD-10-CM | POA: Diagnosis not present

## 2019-06-05 DIAGNOSIS — N3289 Other specified disorders of bladder: Secondary | ICD-10-CM | POA: Diagnosis not present

## 2019-06-05 DIAGNOSIS — I160 Hypertensive urgency: Secondary | ICD-10-CM | POA: Diagnosis not present

## 2019-06-05 DIAGNOSIS — E1151 Type 2 diabetes mellitus with diabetic peripheral angiopathy without gangrene: Secondary | ICD-10-CM

## 2019-06-05 DIAGNOSIS — L0591 Pilonidal cyst without abscess: Secondary | ICD-10-CM | POA: Diagnosis present

## 2019-06-05 DIAGNOSIS — I10 Essential (primary) hypertension: Secondary | ICD-10-CM

## 2019-06-05 DIAGNOSIS — I739 Peripheral vascular disease, unspecified: Secondary | ICD-10-CM

## 2019-06-05 DIAGNOSIS — E785 Hyperlipidemia, unspecified: Secondary | ICD-10-CM | POA: Diagnosis present

## 2019-06-05 DIAGNOSIS — I5032 Chronic diastolic (congestive) heart failure: Secondary | ICD-10-CM | POA: Diagnosis not present

## 2019-06-05 DIAGNOSIS — K5730 Diverticulosis of large intestine without perforation or abscess without bleeding: Secondary | ICD-10-CM | POA: Diagnosis not present

## 2019-06-05 DIAGNOSIS — N390 Urinary tract infection, site not specified: Secondary | ICD-10-CM

## 2019-06-05 DIAGNOSIS — J309 Allergic rhinitis, unspecified: Secondary | ICD-10-CM | POA: Diagnosis present

## 2019-06-05 DIAGNOSIS — I2510 Atherosclerotic heart disease of native coronary artery without angina pectoris: Secondary | ICD-10-CM | POA: Diagnosis present

## 2019-06-05 DIAGNOSIS — A4151 Sepsis due to Escherichia coli [E. coli]: Secondary | ICD-10-CM | POA: Diagnosis not present

## 2019-06-05 DIAGNOSIS — R519 Headache, unspecified: Secondary | ICD-10-CM | POA: Diagnosis not present

## 2019-06-05 DIAGNOSIS — R0602 Shortness of breath: Secondary | ICD-10-CM

## 2019-06-05 DIAGNOSIS — R0789 Other chest pain: Secondary | ICD-10-CM | POA: Diagnosis not present

## 2019-06-05 DIAGNOSIS — E669 Obesity, unspecified: Secondary | ICD-10-CM | POA: Diagnosis present

## 2019-06-05 DIAGNOSIS — E0800 Diabetes mellitus due to underlying condition with hyperosmolarity without nonketotic hyperglycemic-hyperosmolar coma (NKHHC): Secondary | ICD-10-CM

## 2019-06-05 DIAGNOSIS — M109 Gout, unspecified: Secondary | ICD-10-CM | POA: Diagnosis present

## 2019-06-05 DIAGNOSIS — I471 Supraventricular tachycardia: Secondary | ICD-10-CM | POA: Diagnosis not present

## 2019-06-05 DIAGNOSIS — E11649 Type 2 diabetes mellitus with hypoglycemia without coma: Secondary | ICD-10-CM | POA: Diagnosis present

## 2019-06-05 DIAGNOSIS — R8281 Pyuria: Secondary | ICD-10-CM | POA: Diagnosis not present

## 2019-06-05 DIAGNOSIS — R778 Other specified abnormalities of plasma proteins: Secondary | ICD-10-CM | POA: Diagnosis not present

## 2019-06-05 DIAGNOSIS — Z20828 Contact with and (suspected) exposure to other viral communicable diseases: Secondary | ICD-10-CM | POA: Diagnosis present

## 2019-06-05 DIAGNOSIS — K760 Fatty (change of) liver, not elsewhere classified: Secondary | ICD-10-CM | POA: Diagnosis not present

## 2019-06-05 LAB — GLUCOSE, CAPILLARY
Glucose-Capillary: 115 mg/dL — ABNORMAL HIGH (ref 70–99)
Glucose-Capillary: 124 mg/dL — ABNORMAL HIGH (ref 70–99)
Glucose-Capillary: 122 mg/dL — ABNORMAL HIGH (ref 70–99)

## 2019-06-05 LAB — COMPREHENSIVE METABOLIC PANEL
ALT: 28 U/L (ref 0–44)
ALT: 36 U/L (ref 0–44)
AST: 48 U/L — ABNORMAL HIGH (ref 15–41)
AST: 192 U/L — ABNORMAL HIGH (ref 15–41)
Albumin: 3.4 g/dL — ABNORMAL LOW (ref 3.5–5.0)
Albumin: 3.1 g/dL — ABNORMAL LOW (ref 3.5–5.0)
Alkaline Phosphatase: 64 U/L (ref 38–126)
Alkaline Phosphatase: 62 U/L (ref 38–126)
Anion gap: 13 (ref 5–15)
Anion gap: 13 (ref 5–15)
BUN: 20 mg/dL (ref 8–23)
BUN: 16 mg/dL (ref 8–23)
CO2: 24 mmol/L (ref 22–32)
CO2: 21 mmol/L — ABNORMAL LOW (ref 22–32)
Calcium: 8.1 mg/dL — ABNORMAL LOW (ref 8.9–10.3)
Calcium: 7.7 mg/dL — ABNORMAL LOW (ref 8.9–10.3)
Chloride: 99 mmol/L (ref 98–111)
Chloride: 104 mmol/L (ref 98–111)
Creatinine, Ser: 1.61 mg/dL — ABNORMAL HIGH (ref 0.61–1.24)
Creatinine, Ser: 1.27 mg/dL — ABNORMAL HIGH (ref 0.61–1.24)
GFR calc Af Amer: 52 mL/min — ABNORMAL LOW (ref >60)
GFR calc Af Amer: >60 mL/min (ref >60)
GFR calc non Af Amer: 45 mL/min — ABNORMAL LOW (ref >60)
GFR calc non Af Amer: >60 mL/min (ref >60)
Glucose, Bld: 104 mg/dL — ABNORMAL HIGH (ref 70–99)
Glucose, Bld: 177 mg/dL — ABNORMAL HIGH (ref 70–99)
Potassium: 2.5 mmol/L — CL (ref 3.5–5.1)
Potassium: 2.9 mmol/L — ABNORMAL LOW (ref 3.5–5.1)
Sodium: 136 mmol/L (ref 135–145)
Sodium: 138 mmol/L (ref 135–145)
Total Bilirubin: 1.5 mg/dL — ABNORMAL HIGH (ref 0.3–1.2)
Total Bilirubin: 1.1 mg/dL (ref 0.3–1.2)
Total Protein: 7.3 g/dL (ref 6.5–8.1)
Total Protein: 6.6 g/dL (ref 6.5–8.1)

## 2019-06-05 LAB — MAGNESIUM
Magnesium: 1.1 mg/dL — ABNORMAL LOW (ref 1.7–2.4)
Magnesium: 1.2 mg/dL — ABNORMAL LOW (ref 1.7–2.4)
Magnesium: 2.4 mg/dL (ref 1.7–2.4)

## 2019-06-05 LAB — POTASSIUM
Potassium: 2.8 mmol/L — ABNORMAL LOW (ref 3.5–5.1)
Potassium: 3.1 mmol/L — ABNORMAL LOW (ref 3.5–5.1)

## 2019-06-05 LAB — CBC WITH DIFFERENTIAL/PLATELET
Abs Immature Granulocytes: 0.17 10*3/uL — ABNORMAL HIGH (ref 0.00–0.07)
Basophils Absolute: 0.1 10*3/uL (ref 0.0–0.1)
Basophils Relative: 0 %
Eosinophils Absolute: 0 10*3/uL (ref 0.0–0.5)
Eosinophils Relative: 0 %
HCT: 37.9 % — ABNORMAL LOW (ref 39.0–52.0)
Hemoglobin: 13.3 g/dL (ref 13.0–17.0)
Immature Granulocytes: 1 %
Lymphocytes Relative: 7 %
Lymphs Abs: 1.5 10*3/uL (ref 0.7–4.0)
MCH: 32 pg (ref 26.0–34.0)
MCHC: 35.1 g/dL (ref 30.0–36.0)
MCV: 91.3 fL (ref 80.0–100.0)
Monocytes Absolute: 1.6 10*3/uL — ABNORMAL HIGH (ref 0.1–1.0)
Monocytes Relative: 7 %
Neutro Abs: 18.9 10*3/uL — ABNORMAL HIGH (ref 1.7–7.7)
Neutrophils Relative %: 85 %
Platelets: 182 10*3/uL (ref 150–400)
RBC: 4.15 MIL/uL — ABNORMAL LOW (ref 4.22–5.81)
RDW: 12.7 % (ref 11.5–15.5)
WBC: 22.1 10*3/uL — ABNORMAL HIGH (ref 4.0–10.5)
nRBC: 0 % (ref 0.0–0.2)

## 2019-06-05 LAB — ECHOCARDIOGRAM COMPLETE
Height: 68 in
Weight: 4176.39 oz

## 2019-06-05 LAB — SARS CORONAVIRUS 2 BY RT PCR (HOSPITAL ORDER, PERFORMED IN ~~LOC~~ HOSPITAL LAB): SARS Coronavirus 2: NEGATIVE

## 2019-06-05 LAB — URINALYSIS, ROUTINE W REFLEX MICROSCOPIC
Bilirubin Urine: NEGATIVE
Glucose, UA: NEGATIVE mg/dL
Ketones, ur: NEGATIVE mg/dL
Nitrite: NEGATIVE
Protein, ur: 100 mg/dL — AB
Specific Gravity, Urine: 1.008 (ref 1.005–1.030)
WBC, UA: >50 WBC/hpf — ABNORMAL HIGH (ref 0–5)
pH: 6 (ref 5.0–8.0)

## 2019-06-05 LAB — PROCALCITONIN: Procalcitonin: 6.61 ng/mL

## 2019-06-05 LAB — PROTIME-INR
INR: 1.1 (ref 0.8–1.2)
Prothrombin Time: 14.3 seconds (ref 11.4–15.2)

## 2019-06-05 LAB — APTT: aPTT: 31 seconds (ref 24–36)

## 2019-06-05 LAB — TROPONIN I (HIGH SENSITIVITY)
Troponin I (High Sensitivity): 162 ng/L (ref <18)
Troponin I (High Sensitivity): 509 ng/L (ref <18)
Troponin I (High Sensitivity): 741 ng/L (ref <18)
Troponin I (High Sensitivity): 666 ng/L (ref <18)

## 2019-06-05 LAB — C DIFFICILE QUICK SCREEN W PCR REFLEX
C Diff antigen: NEGATIVE
C Diff interpretation: NOT DETECTED
C Diff toxin: NEGATIVE

## 2019-06-05 LAB — BRAIN NATRIURETIC PEPTIDE: B Natriuretic Peptide: 112.6 pg/mL — ABNORMAL HIGH (ref 0.0–100.0)

## 2019-06-05 LAB — CBG MONITORING, ED: Glucose-Capillary: 63 mg/dL — ABNORMAL LOW (ref 70–99)

## 2019-06-05 LAB — MRSA PCR SCREENING: MRSA by PCR: NEGATIVE

## 2019-06-05 LAB — LACTIC ACID, PLASMA
Lactic Acid, Venous: 3 mmol/L (ref 0.5–1.9)
Lactic Acid, Venous: 1.5 mmol/L (ref 0.5–1.9)

## 2019-06-05 LAB — HIV ANTIBODY (ROUTINE TESTING W REFLEX): HIV Screen 4th Generation wRfx: NONREACTIVE

## 2019-06-05 MED ORDER — LABETALOL HCL 5 MG/ML IV SOLN
10.0000 mg | INTRAVENOUS | Status: DC | PRN
  Administered 2019-06-07: 10 mg via INTRAVENOUS
  Filled 2019-06-05: qty 4

## 2019-06-05 MED ORDER — SODIUM CHLORIDE 0.9 % IV SOLN
2.0000 g | Freq: Once | INTRAVENOUS | Status: AC
  Administered 2019-06-05: 2 g via INTRAVENOUS
  Filled 2019-06-05: qty 2

## 2019-06-05 MED ORDER — METOPROLOL TARTRATE 5 MG/5ML IV SOLN
5.0000 mg | Freq: Once | INTRAVENOUS | Status: AC
  Administered 2019-06-05: 18:00:00 5 mg via INTRAVENOUS
  Filled 2019-06-05: qty 5

## 2019-06-05 MED ORDER — POTASSIUM CHLORIDE CRYS ER 20 MEQ PO TBCR
40.0000 meq | EXTENDED_RELEASE_TABLET | Freq: Once | ORAL | Status: AC
  Administered 2019-06-05: 40 meq via ORAL
  Filled 2019-06-05: qty 2

## 2019-06-05 MED ORDER — LORATADINE 10 MG PO TABS
10.0000 mg | ORAL_TABLET | Freq: Every day | ORAL | Status: DC
  Administered 2019-06-05 – 2019-06-09 (×5): 10 mg via ORAL
  Filled 2019-06-05 (×5): qty 1

## 2019-06-05 MED ORDER — ACETAMINOPHEN 500 MG PO TABS
500.0000 mg | ORAL_TABLET | Freq: Four times a day (QID) | ORAL | Status: DC | PRN
  Administered 2019-06-05 – 2019-06-07 (×2): 500 mg via ORAL
  Filled 2019-06-05 (×2): qty 1

## 2019-06-05 MED ORDER — MAGNESIUM SULFATE 50 % IJ SOLN
4.0000 g | Freq: Once | INTRAVENOUS | Status: DC
  Filled 2019-06-05: qty 8

## 2019-06-05 MED ORDER — IOHEXOL 350 MG/ML SOLN
80.0000 mL | Freq: Once | INTRAVENOUS | Status: AC | PRN
  Administered 2019-06-05: 01:00:00 80 mL via INTRAVENOUS

## 2019-06-05 MED ORDER — INSULIN GLARGINE 100 UNIT/ML ~~LOC~~ SOLN
80.0000 [IU] | Freq: Every day | SUBCUTANEOUS | Status: DC
  Filled 2019-06-05: qty 0.8

## 2019-06-05 MED ORDER — METRONIDAZOLE IN NACL 5-0.79 MG/ML-% IV SOLN
500.0000 mg | Freq: Once | INTRAVENOUS | Status: AC
  Administered 2019-06-05: 500 mg via INTRAVENOUS
  Filled 2019-06-05: qty 100

## 2019-06-05 MED ORDER — ATORVASTATIN CALCIUM 10 MG PO TABS
20.0000 mg | ORAL_TABLET | Freq: Every day | ORAL | Status: DC
  Administered 2019-06-05: 12:00:00 20 mg via ORAL
  Filled 2019-06-05: qty 2
  Filled 2019-06-05: qty 1

## 2019-06-05 MED ORDER — PANTOPRAZOLE SODIUM 40 MG PO TBEC
40.0000 mg | DELAYED_RELEASE_TABLET | Freq: Every day | ORAL | Status: DC
  Administered 2019-06-05 – 2019-06-09 (×5): 40 mg via ORAL
  Filled 2019-06-05 (×5): qty 1

## 2019-06-05 MED ORDER — INSULIN GLARGINE 100 UNIT/ML ~~LOC~~ SOLN
80.0000 [IU] | Freq: Two times a day (BID) | SUBCUTANEOUS | Status: DC
  Filled 2019-06-05: qty 0.8

## 2019-06-05 MED ORDER — INSULIN ASPART 100 UNIT/ML ~~LOC~~ SOLN
0.0000 [IU] | Freq: Three times a day (TID) | SUBCUTANEOUS | Status: DC
  Administered 2019-06-05: 12:00:00 4 [IU] via SUBCUTANEOUS
  Administered 2019-06-06: 7 [IU] via SUBCUTANEOUS
  Administered 2019-06-06: 11 [IU] via SUBCUTANEOUS
  Administered 2019-06-07: 3 [IU] via SUBCUTANEOUS
  Administered 2019-06-07: 17:00:00 11 [IU] via SUBCUTANEOUS
  Administered 2019-06-07: 13:00:00 7 [IU] via SUBCUTANEOUS
  Administered 2019-06-08: 17:00:00 4 [IU] via SUBCUTANEOUS
  Administered 2019-06-08: 13:00:00 7 [IU] via SUBCUTANEOUS
  Administered 2019-06-09: 08:00:00 3 [IU] via SUBCUTANEOUS
  Filled 2019-06-05: qty 0.2

## 2019-06-05 MED ORDER — HYDRALAZINE HCL 20 MG/ML IJ SOLN
5.0000 mg | Freq: Once | INTRAMUSCULAR | Status: AC
  Administered 2019-06-05: 5 mg via INTRAVENOUS
  Filled 2019-06-05: qty 1

## 2019-06-05 MED ORDER — LOSARTAN POTASSIUM 50 MG PO TABS
100.0000 mg | ORAL_TABLET | Freq: Every day | ORAL | Status: DC
  Filled 2019-06-05: qty 2

## 2019-06-05 MED ORDER — ENOXAPARIN SODIUM 40 MG/0.4ML ~~LOC~~ SOLN
40.0000 mg | SUBCUTANEOUS | Status: DC
  Administered 2019-06-05 – 2019-06-08 (×4): 40 mg via SUBCUTANEOUS
  Filled 2019-06-05 (×5): qty 0.4

## 2019-06-05 MED ORDER — SODIUM CHLORIDE 0.9 % IV BOLUS
30.0000 mL/kg | Freq: Once | INTRAVENOUS | Status: AC
  Administered 2019-06-05: 3500 mL via INTRAVENOUS

## 2019-06-05 MED ORDER — SENNA 8.6 MG PO TABS
1.0000 | ORAL_TABLET | Freq: Two times a day (BID) | ORAL | Status: DC
  Filled 2019-06-05: qty 1

## 2019-06-05 MED ORDER — PIPERACILLIN-TAZOBACTAM 3.375 G IVPB
3.3750 g | Freq: Three times a day (TID) | INTRAVENOUS | Status: DC
  Administered 2019-06-05 (×2): 3.375 g via INTRAVENOUS
  Filled 2019-06-05 (×2): qty 50

## 2019-06-05 MED ORDER — POTASSIUM CHLORIDE CRYS ER 20 MEQ PO TBCR
20.0000 meq | EXTENDED_RELEASE_TABLET | Freq: Every day | ORAL | Status: DC
  Administered 2019-06-05 – 2019-06-09 (×5): 20 meq via ORAL
  Filled 2019-06-05 (×5): qty 1

## 2019-06-05 MED ORDER — SODIUM CHLORIDE 0.9 % IV SOLN
INTRAVENOUS | Status: DC
  Administered 2019-06-05 – 2019-06-06 (×2): via INTRAVENOUS

## 2019-06-05 MED ORDER — POTASSIUM CHLORIDE CRYS ER 20 MEQ PO TBCR
60.0000 meq | EXTENDED_RELEASE_TABLET | Freq: Once | ORAL | Status: AC
  Administered 2019-06-05: 60 meq via ORAL
  Filled 2019-06-05: qty 3

## 2019-06-05 MED ORDER — VANCOMYCIN HCL 10 G IV SOLR
2000.0000 mg | Freq: Once | INTRAVENOUS | Status: AC
  Administered 2019-06-05: 2000 mg via INTRAVENOUS
  Filled 2019-06-05: qty 2000

## 2019-06-05 MED ORDER — INSULIN GLARGINE 100 UNIT/ML SOLOSTAR PEN: 240.0000 [IU] | PEN_INJECTOR | SUBCUTANEOUS | Status: DC

## 2019-06-05 MED ORDER — METRONIDAZOLE IN NACL 5-0.79 MG/ML-% IV SOLN
500.0000 mg | Freq: Three times a day (TID) | INTRAVENOUS | Status: DC
  Administered 2019-06-05 – 2019-06-06 (×3): 500 mg via INTRAVENOUS
  Filled 2019-06-05 (×3): qty 100

## 2019-06-05 MED ORDER — PIPERACILLIN-TAZOBACTAM 4.5 G IVPB: 4.5000 g | Freq: Four times a day (QID) | INTRAVENOUS | Status: DC

## 2019-06-05 MED ORDER — AMLODIPINE BESYLATE 5 MG PO TABS
5.0000 mg | ORAL_TABLET | Freq: Every day | ORAL | Status: DC
  Administered 2019-06-05 – 2019-06-06 (×2): 5 mg via ORAL
  Filled 2019-06-05 (×2): qty 1

## 2019-06-05 MED ORDER — VANCOMYCIN HCL IN DEXTROSE 1-5 GM/200ML-% IV SOLN
1000.0000 mg | Freq: Once | INTRAVENOUS | Status: DC
  Filled 2019-06-05: qty 200

## 2019-06-05 MED ORDER — POTASSIUM CHLORIDE 10 MEQ/100ML IV SOLN
10.0000 meq | INTRAVENOUS | Status: AC
  Administered 2019-06-05 (×6): 10 meq via INTRAVENOUS
  Filled 2019-06-05 (×6): qty 100

## 2019-06-05 MED ORDER — ALBUTEROL SULFATE HFA 108 (90 BASE) MCG/ACT IN AERS
2.0000 | INHALATION_SPRAY | Freq: Once | RESPIRATORY_TRACT | Status: AC
  Administered 2019-06-05: 2 via RESPIRATORY_TRACT
  Filled 2019-06-05: qty 6.7

## 2019-06-05 MED ORDER — HYDROCHLOROTHIAZIDE 25 MG PO TABS
25.0000 mg | ORAL_TABLET | Freq: Every day | ORAL | Status: DC
  Filled 2019-06-05: qty 1

## 2019-06-05 MED ORDER — CHLORHEXIDINE GLUCONATE CLOTH 2 % EX PADS
6.0000 | MEDICATED_PAD | Freq: Every day | CUTANEOUS | Status: DC
  Administered 2019-06-05 – 2019-06-09 (×5): 6 via TOPICAL

## 2019-06-05 MED ORDER — ASPIRIN EC 81 MG PO TBEC
81.0000 mg | DELAYED_RELEASE_TABLET | Freq: Every day | ORAL | Status: DC
  Administered 2019-06-05 – 2019-06-09 (×5): 81 mg via ORAL
  Filled 2019-06-05 (×5): qty 1

## 2019-06-05 MED ORDER — ONDANSETRON HCL 4 MG/2ML IJ SOLN
4.0000 mg | Freq: Four times a day (QID) | INTRAMUSCULAR | Status: DC | PRN
  Administered 2019-06-05: 17:00:00 4 mg via INTRAVENOUS
  Filled 2019-06-05: qty 2

## 2019-06-05 MED ORDER — SODIUM CHLORIDE (PF) 0.9 % IJ SOLN
INTRAMUSCULAR | Status: AC
  Administered 2019-06-05: 03:00:00
  Filled 2019-06-05: qty 50

## 2019-06-05 MED ORDER — METOPROLOL TARTRATE 25 MG PO TABS
25.0000 mg | ORAL_TABLET | Freq: Two times a day (BID) | ORAL | Status: DC
  Administered 2019-06-05 – 2019-06-06 (×4): 25 mg via ORAL
  Filled 2019-06-05 (×4): qty 1

## 2019-06-05 MED ORDER — POTASSIUM CHLORIDE 10 MEQ/100ML IV SOLN
10.0000 meq | INTRAVENOUS | Status: AC
  Administered 2019-06-05 (×4): 10 meq via INTRAVENOUS
  Filled 2019-06-05 (×4): qty 100

## 2019-06-05 MED ORDER — INSULIN GLARGINE 100 UNIT/ML ~~LOC~~ SOLN
50.0000 [IU] | Freq: Two times a day (BID) | SUBCUTANEOUS | Status: DC
  Administered 2019-06-05 – 2019-06-09 (×8): 50 [IU] via SUBCUTANEOUS
  Filled 2019-06-05 (×9): qty 0.5

## 2019-06-05 MED ORDER — INSULIN GLARGINE 100 UNIT/ML ~~LOC~~ SOLN
60.0000 [IU] | Freq: Two times a day (BID) | SUBCUTANEOUS | Status: DC
  Filled 2019-06-05: qty 0.6

## 2019-06-05 MED ORDER — MAGNESIUM SULFATE 4 GM/100ML IV SOLN
4.0000 g | Freq: Once | INTRAVENOUS | Status: AC
  Administered 2019-06-05: 4 g via INTRAVENOUS
  Filled 2019-06-05: qty 100

## 2019-06-05 MED ORDER — SODIUM CHLORIDE 0.9 % IV SOLN
2.0000 g | Freq: Three times a day (TID) | INTRAVENOUS | Status: DC
  Administered 2019-06-05 – 2019-06-07 (×7): 2 g via INTRAVENOUS
  Filled 2019-06-05 (×7): qty 2

## 2019-06-05 MED ORDER — VANCOMYCIN HCL 10 G IV SOLR
1500.0000 mg | INTRAVENOUS | Status: DC
  Administered 2019-06-06 – 2019-06-07 (×2): 1500 mg via INTRAVENOUS
  Filled 2019-06-05 (×3): qty 1500

## 2019-06-05 MED ORDER — AMLODIPINE BESYLATE 5 MG PO TABS: 2.5000 mg | ORAL_TABLET | Freq: Every day | ORAL | Status: DC

## 2019-06-05 NOTE — ED Notes (Signed)
ED TO INPATIENT HANDOFF REPORT  Name/Age/Gender [name] 63 y.o. male  Code Status    Code Status Orders  (From admission, onward)         Start     Ordered   06/05/19 0305  Full code  Continuous     06/05/19 0306        Code Status History    This patient has a current code status but no historical code status.   Advance Care Planning Activity    Advance Directive Documentation     Most Recent Value  Type of Advance Directive  Healthcare Power of Attorney  Pre-existing out of facility DNR order (yellow form or pink MOST form)  -  "MOST" Form in Place?  -      Home/SNF/Other Home  Chief Complaint Sepsis  Level of Care/Admitting Diagnosis ED Disposition    ED Disposition Condition [place]: [place] [[id number]]  Level of Care: Stepdown [14]  Admit to SDU based on following criteria: Severe physiological/psychological symptoms:  Any diagnosis requiring assessment & intervention at least every 4 hours on an ongoing basis to obtain desired patient outcomes including stability and rehabilitation  Covid Evaluation: Confirmed COVID Negative  Diagnosis: [facility]epsis secondary to [facility]TI [id number]  Admitting Physician: Neena Rhymes [[id number]]  Attending Physician: Adella Hare E [[id number]]  Estimated length of stay: 3 - 4 days  Certification:: I certify this patient will need inpatient services for at least 2 midnights  PT Class (Do Not Modify): Inpatient [101]  PT Acc Code (Do Not Modify): Private [1]       Medical History Past Medical History:  Diagnosis Date  . Allergic rhinitis   . At risk for sleep apnea    STOP-BANG= 5   SENT TO PCP 03-14-2014  . Cataract    surgically removed bilateral  . Condyloma acuminatum of penis   . Diabetic neuropathy ([place])   . GERD (gastroesophageal reflux disease)   . History of bladder cancer    s/p  turbt  2013/   transitional cell carcinoma--   . History of condyloma acuminatum     PERINEAL AREA  W/ RECURRENCY  . History of gout   . Hyperlipidemia   . Hypertension   . Lower urinary tract symptoms (LUTS)   . Productive cough   . PVD (peripheral vascular disease) with claudication (HCC)    bilateral SFA disease-- right > left  and left tibial artery disease--  per duplex  . Smokers' cough ([place])   . Type 2 diabetes mellitus with insulin therapy ([place])    monitor by  dr ellsion  . Wears dentures    upper    Allergies No Known Allergies  IV Location/Drains/Wounds Patient Lines/Drains/Airways Status   Active Line/Drains/Airways    Name:   Placement date:   Placement time:   Site:   Days:   Peripheral IV 06/04/19 Right Antecubital   06/04/19    -    Antecubital   1   Peripheral IV 06/04/19 Left Antecubital   06/04/19    2341    Antecubital   1   Urethral Catheter Ilene H. RN Latex;Straight-tip 16 Fr.   06/05/19    0455    Latex;Straight-tip   less than 1          Labs/Imaging Results for orders placed or performed during the hospital encounter of 06/04/19 (from the past 48 hour(s))  CBC with Differential/Platelet  Status: Abnormal   Collection Time: 06/04/19 11:18 PM  Result Value Ref Range   WBC 21.0 (H) 4.0 - 10.5 K/uL   RBC 4.44 4.22 - 5.81 MIL/uL   Hemoglobin 14.1 13.0 - 17.0 g/dL   HCT 40.7 39.0 - 52.0 %   MCV 91.7 80.0 - 100.0 fL   MCH 31.8 26.0 - 34.0 pg   MCHC 34.6 30.0 - 36.0 g/dL   RDW 12.5 11.5 - 15.5 %   Platelets 218 150 - 400 K/uL   nRBC 0.0 0.0 - 0.2 %   Neutrophils Relative % 86 %   Neutro Abs 18.0 (H) 1.7 - 7.7 K/uL   Lymphocytes Relative 6 %   Lymphs Abs 1.3 0.7 - 4.0 K/uL   Monocytes Relative 7 %   Monocytes Absolute 1.4 (H) 0.1 - 1.0 K/uL   Eosinophils Relative 0 %   Eosinophils Absolute 0.0 0.0 - 0.5 K/uL   Basophils Relative 0 %   Basophils Absolute 0.1 0.0 - 0.1 K/uL   Immature Granulocytes 1 %   Abs Immature Granulocytes 0.26 (H) 0.00 - 0.07 K/uL    Comment: Performed at [facility], [place]  [address], [place], [place] [address]  Troponin I (High Sensitivity)     Status: Abnormal   Collection Time: 06/04/19 11:18 PM  Result Value Ref Range   Troponin I (High Sensitivity) 162 (HH) <18 ng/L    Comment: CRITICAL RESULT CALLED TO, READ BACK BY AND VERIFIED WITH: NASH,J AT 0016 ON 06/05/2019 BY MOSLEY,J  (NOTE) Elevated high sensitivity troponin I (hsTnI) values and significant  changes across serial measurements may suggest ACS but many other  chronic and acute conditions are known to elevate hsTnI results.  Refer to the Links section for chest pain algorithms and additional  guidance. Performed at [facility], [place] [address] [place], [place] [address]   Brain natriuretic peptide     Status: Abnormal   Collection Time: 06/04/19 11:18 PM  Result Value Ref Range   B Natriuretic Peptide 112.6 (H) 0.0 - 100.0 pg/mL    Comment: Performed at [facility], [place] [address] [place], [place] [address]  Comprehensive metabolic panel     Status: Abnormal   Collection Time: 06/04/19 11:18 PM  Result Value Ref Range   Sodium 136 135 - 145 mmol/L   Potassium 2.5 (LL) 3.5 - 5.1 mmol/L    Comment: CRITICAL RESULT CALLED TO, READ BACK BY AND VERIFIED WITH: NASH,J AT 0016 ON 06/05/2019 BY MOSLEY,J     Chloride 99 98 - 111 mmol/L   CO2 24 22 - 32 mmol/L   Glucose, Bld 104 (H) 70 - 99 mg/dL   BUN 20 8 - 23 mg/dL   Creatinine, Ser 1.61 (H) 0.61 - 1.24 mg/dL   Calcium 8.1 (L) 8.9 - 10.3 mg/dL   Total Protein 7.3 6.5 - 8.1 g/dL   Albumin 3.4 (L) 3.5 - 5.0 g/dL   AST 48 (H) 15 - 41 U/L   ALT 28 0 - 44 U/L   Alkaline Phosphatase 64 38 - 126 U/L   Total Bilirubin 1.5 (H) 0.3 - 1.2 mg/dL   GFR calc non Af Amer 45 (L) >60 mL/min   GFR calc Af Amer 52 (L) >60 mL/min   Anion gap 13 5 - 15    Comment: Performed at [facility], [place] [address], [place], [place] [address]  Protime-INR     Status: None   Collection Time:  06/04/19 11:18 PM   Result Value Ref Range   Prothrombin Time 14.3 11.4 - 15.2 seconds   INR 1.1 0.8 - 1.2    Comment: (NOTE) INR goal varies based on device and disease states. Performed at [facility], [place] [address], [place], [place] [address]   Lactic acid, plasma     Status: Abnormal   Collection Time: 06/04/19 11:23 PM  Result Value Ref Range   Lactic Acid, Venous 3.0 (HH) 0.5 - 1.9 mmol/L    Comment: CRITICAL RESULT CALLED TO, READ BACK BY AND VERIFIED WITH: NASH,J AT 0014 ON 06/05/2019 BY MOSLEY,J Performed at [facility], [place] [address] [place], [place] [address]   Urinalysis, Routine w reflex microscopic     Status: Abnormal   Collection Time: 06/04/19 11:23 PM  Result Value Ref Range   Color, Urine YELLOW YELLOW   APPearance CLEAR CLEAR   Specific Gravity, Urine 1.008 1.005 - 1.030   pH 6.0 5.0 - 8.0   Glucose, UA NEGATIVE NEGATIVE mg/dL   Hgb urine dipstick LARGE (A) NEGATIVE   Bilirubin Urine NEGATIVE NEGATIVE   Ketones, ur NEGATIVE NEGATIVE mg/dL   Protein, ur 100 (A) NEGATIVE mg/dL   Nitrite NEGATIVE NEGATIVE   Leukocytes,Ua MODERATE (A) NEGATIVE   RBC / HPF 0-5 0 - 5 RBC/hpf   WBC, UA >50 (H) 0 - 5 WBC/hpf   Bacteria, UA RARE (A) NONE SEEN   Squamous Epithelial / LPF 0-5 0 - 5   WBC Clumps PRESENT    Mucus PRESENT     Comment: Performed at [facility], [place] [address], [place], [place] [address]  I-stat chem 8, ED (not at [facility] or [facility])     Status: Abnormal   Collection Time: 06/04/19 11:36 PM  Result Value Ref Range   Sodium 137 135 - 145 mmol/L   Potassium 2.5 (LL) 3.5 - 5.1 mmol/L   Chloride 98 98 - 111 mmol/L   BUN 19 8 - 23 mg/dL   Creatinine, Ser 1.60 (H) 0.61 - 1.24 mg/dL   Glucose, Bld 96 70 - 99 mg/dL   Calcium, Ion 1.03 (L) 1.15 - 1.40 mmol/L   TCO2 25 22 - 32 mmol/L   Hemoglobin 13.3 13.0 - 17.0 g/dL   HCT 39.0 39.0 - 52.0 %   Comment NOTIFIED PHYSICIAN   Potassium     Status: Abnormal   Collection  Time: 06/04/19 11:48 PM  Result Value Ref Range   Potassium 2.8 (L) 3.5 - 5.1 mmol/L    Comment: Performed at [facility], [place] [address] [place], [place] [address]  Magnesium     Status: Abnormal   Collection Time: 06/04/19 11:48 PM  Result Value Ref Range   Magnesium 1.1 (L) 1.7 - 2.4 mg/dL    Comment: Performed at [facility], [place] [address], [place], [place] [address]  APTT     Status: None   Collection Time: 06/05/19 12:41 AM  Result Value Ref Range   aPTT 31 24 - 36 seconds    Comment: Performed at [facility], [place] [address], [place], [place] [address]  SARS Coronavirus 2 by RT PCR (hospital order, performed in [facility] hospital lab) Nasopharyngeal Nasopharyngeal Swab     Status: None   Collection Time: 06/05/19  1:10 AM   Specimen: Nasopharyngeal Swab  Result Value Ref Range   SARS Coronavirus 2 NEGATIVE NEGATIVE    Comment: (NOTE) If result is  NEGATIVE SARS-CoV-2 target nucleic acids are NOT DETECTED. The SARS-CoV-2 RNA is generally detectable in upper and lower  respiratory specimens during the acute phase of infection. The lowest  concentration of SARS-CoV-2 viral copies this assay can detect is 250  copies / mL. A negative result does not preclude SARS-CoV-2 infection  and should not be used as the sole basis for treatment or other  patient management decisions.  A negative result may occur with  improper specimen collection / handling, submission of specimen other  than nasopharyngeal swab, presence of viral mutation(s) within the  areas targeted by this assay, and inadequate number of viral copies  (<250 copies / mL). A negative result must be combined with clinical  observations, patient history, and epidemiological information. If result is POSITIVE SARS-CoV-2 target nucleic acids are DETECTED. The SARS-CoV-2 RNA is generally detectable in upper and lower  respiratory specimens dur ing the acute phase  of infection.  Positive  results are indicative of active infection with SARS-CoV-2.  Clinical  correlation with patient history and other diagnostic information is  necessary to determine patient infection status.  Positive results do  not rule out bacterial infection or co-infection with other viruses. If result is PRESUMPTIVE POSTIVE SARS-CoV-2 nucleic acids MAY BE PRESENT.   A presumptive positive result was obtained on the submitted specimen  and confirmed on repeat testing.  While 2019 novel coronavirus  (SARS-CoV-2) nucleic acids may be present in the submitted sample  additional confirmatory testing may be necessary for epidemiological  and / or clinical management purposes  to differentiate between  SARS-CoV-2 and other Sarbecovirus currently known to infect humans.  If clinically indicated additional testing with an alternate test  methodology [phone]) is advised. The SARS-CoV-2 RNA is generally  detectable in upper and lower respiratory sp ecimens during the acute  phase of infection. The expected result is Negative. Fact Sheet for Patients:  StrictlyIdeas.no Fact Sheet for Healthcare Providers: BankingDealers.co.za This test is not yet approved or cleared by the [place] FDA and has been authorized for detection and/or diagnosis of SARS-CoV-2 by FDA under an Emergency Use Authorization (EUA).  This EUA will remain in effect (meaning this test can be used) for the duration of the COVID-19 declaration under Section 564(b)(1) of the Act, 21 U.S.C. section 360bbb-3(b)(1), unless the authorization is terminated or revoked sooner. Performed at [facility], [place] [address] [place], [place] [address]   Lactic acid, plasma     Status: None   Collection Time: 06/05/19  3:18 AM  Result Value Ref Range   Lactic Acid, Venous 1.5 0.5 - 1.9 mmol/L    Comment: Performed at [facility], [place]  [address] [place], [place] [address]  Troponin I (High Sensitivity)     Status: Abnormal   Collection Time: 06/05/19  3:18 AM  Result Value Ref Range   Troponin I (High Sensitivity) 509 (HH) <18 ng/L    Comment: CRITICAL VALUE NOTED.  VALUE IS CONSISTENT WITH PREVIOUSLY REPORTED AND CALLED VALUE. (NOTE) Elevated high sensitivity troponin I (hsTnI) values and significant  changes across serial measurements may suggest ACS but many other  chronic and acute conditions are known to elevate hsTnI results.  Refer to the Links section for chest pain algorithms and additional  guidance. Performed at [facility], [place] [address] [place], [place] [address]    Ct Angio Chest Pe W And/or Wo Contrast  Result Date: 06/05/2019 CLINICAL DATA:  63 year old male with increased  shortness of breath and fever. EXAM: CT ANGIOGRAPHY CHEST CT ABDOMEN AND PELVIS WITH CONTRAST TECHNIQUE: Multidetector CT imaging of the chest was performed using the standard protocol during bolus administration of intravenous contrast. Multiplanar CT image reconstructions and MIPs were obtained to evaluate the vascular anatomy. Multidetector CT imaging of the abdomen and pelvis was performed using the standard protocol during bolus administration of intravenous contrast. CONTRAST:  65mL OMNIPAQUE IOHEXOL 350 MG/ML SOLN COMPARISON:  CT of the abdomen pelvis dated 05/01/2017 and chest radiograph dated 06/05/2019 FINDINGS: CTA CHEST FINDINGS Cardiovascular: There is no cardiomegaly or pericardial effusion. Coronary vascular calcification of the LAD. Mild atherosclerotic calcification of the thoracic aorta. No aneurysmal dilatation or dissection. Evaluation of the pulmonary arteries is very limited due to suboptimal opacification and timing of the contrast as well as respiratory motion artifact. No definite large central pulmonary artery embolus identified. Mediastinum/Nodes: There is no hilar or mediastinal adenopathy. The  esophagus is grossly unremarkable. No mediastinal fluid collection. Lungs/Pleura: Minimal left lung base linear atelectasis/scarring. No focal consolidation, pleural effusion, or pneumothorax. The central airways are patent. Musculoskeletal: No acute osseous pathology. Review of the MIP images confirms the above findings. CT ABDOMEN and PELVIS FINDINGS No intra-abdominal free air or free fluid. Hepatobiliary: Diffuse fatty infiltration of the liver. No intrahepatic biliary ductal dilatation. There is sludge or noncalcified stone in the gallbladder. No pericholecystic fluid or evidence of acute cholecystitis by CT. Pancreas: Unremarkable. No pancreatic ductal dilatation or surrounding inflammatory changes. Spleen: Normal in size without focal abnormality. Adrenals/Urinary Tract: The adrenal glands are unremarkable. There is no hydronephrosis on either side. There is symmetric enhancement and excretion of contrast by both kidneys. The visualized ureters and urinary bladder appear unremarkable. Minimal bilateral perinephric stranding, nonspecific. Correlation with urinalysis recommended to exclude UTI. Stomach/Bowel: There is sigmoid diverticulosis without active inflammatory changes. There is no bowel obstruction or active inflammation. The appendix is normal. Vascular/Lymphatic: Advanced aortoiliac atherosclerotic disease. The IVC is unremarkable. No portal venous gas. There is no adenopathy. Reproductive: Enlarged prostate gland measuring 6 cm in transverse axial diameter. The seminal vesicles are symmetric. Other: None Musculoskeletal: Degenerative changes of the spine. No acute osseous pathology. Review of the MIP images confirms the above findings. IMPRESSION: 1. No acute intrathoracic, abdominal, or pelvic pathology. No CT evidence of central pulmonary artery embolus. 2. Fatty liver. 3. Sigmoid diverticulosis. No bowel obstruction or active inflammation. Normal appendix. 4. Gallbladder sludge or noncalcified  stones. Aortic Atherosclerosis (ICD10-I70.0). Electronically Signed   By: Anner Crete M.D.   On: 06/05/2019 01:54   Ct Abdomen Pelvis W Contrast  Result Date: 06/05/2019 CLINICAL DATA:  63 year old male with increased shortness of breath and fever. EXAM: CT ANGIOGRAPHY CHEST CT ABDOMEN AND PELVIS WITH CONTRAST TECHNIQUE: Multidetector CT imaging of the chest was performed using the standard protocol during bolus administration of intravenous contrast. Multiplanar CT image reconstructions and MIPs were obtained to evaluate the vascular anatomy. Multidetector CT imaging of the abdomen and pelvis was performed using the standard protocol during bolus administration of intravenous contrast. CONTRAST:  58mL OMNIPAQUE IOHEXOL 350 MG/ML SOLN COMPARISON:  CT of the abdomen pelvis dated 05/01/2017 and chest radiograph dated 06/05/2019 FINDINGS: CTA CHEST FINDINGS Cardiovascular: There is no cardiomegaly or pericardial effusion. Coronary vascular calcification of the LAD. Mild atherosclerotic calcification of the thoracic aorta. No aneurysmal dilatation or dissection. Evaluation of the pulmonary arteries is very limited due to suboptimal opacification and timing of the contrast as well as respiratory motion artifact. No definite large central  pulmonary artery embolus identified. Mediastinum/Nodes: There is no hilar or mediastinal adenopathy. The esophagus is grossly unremarkable. No mediastinal fluid collection. Lungs/Pleura: Minimal left lung base linear atelectasis/scarring. No focal consolidation, pleural effusion, or pneumothorax. The central airways are patent. Musculoskeletal: No acute osseous pathology. Review of the MIP images confirms the above findings. CT ABDOMEN and PELVIS FINDINGS No intra-abdominal free air or free fluid. Hepatobiliary: Diffuse fatty infiltration of the liver. No intrahepatic biliary ductal dilatation. There is sludge or noncalcified stone in the gallbladder. No pericholecystic fluid  or evidence of acute cholecystitis by CT. Pancreas: Unremarkable. No pancreatic ductal dilatation or surrounding inflammatory changes. Spleen: Normal in size without focal abnormality. Adrenals/Urinary Tract: The adrenal glands are unremarkable. There is no hydronephrosis on either side. There is symmetric enhancement and excretion of contrast by both kidneys. The visualized ureters and urinary bladder appear unremarkable. Minimal bilateral perinephric stranding, nonspecific. Correlation with urinalysis recommended to exclude UTI. Stomach/Bowel: There is sigmoid diverticulosis without active inflammatory changes. There is no bowel obstruction or active inflammation. The appendix is normal. Vascular/Lymphatic: Advanced aortoiliac atherosclerotic disease. The IVC is unremarkable. No portal venous gas. There is no adenopathy. Reproductive: Enlarged prostate gland measuring 6 cm in transverse axial diameter. The seminal vesicles are symmetric. Other: None Musculoskeletal: Degenerative changes of the spine. No acute osseous pathology. Review of the MIP images confirms the above findings. IMPRESSION: 1. No acute intrathoracic, abdominal, or pelvic pathology. No CT evidence of central pulmonary artery embolus. 2. Fatty liver. 3. Sigmoid diverticulosis. No bowel obstruction or active inflammation. Normal appendix. 4. Gallbladder sludge or noncalcified stones. Aortic Atherosclerosis (ICD10-I70.0). Electronically Signed   By: Anner Crete M.D.   On: 06/05/2019 01:54   Dg Chest Portable 1 View  Result Date: 06/05/2019 CLINICAL DATA:  63 year old male with shortness of breath. EXAM: PORTABLE CHEST 1 VIEW COMPARISON:  Chest radiograph dated 10/02/2014 FINDINGS: There is blunting of the left costophrenic angle which may represent trace left pleural effusion. Left lung base densities, likely atelectatic changes. Developing infiltrate is not excluded. Clinical correlation is recommended. The right lung is clear. There is  no pneumothorax. The cardiac silhouette is within normal limits. No acute osseous pathology. IMPRESSION: Probable trace left pleural effusion and left lung base atelectasis. Developing infiltrate is not excluded. Clinical correlation recommended. Electronically Signed   By: Anner Crete M.D.   On: 06/05/2019 00:17    Pending Labs Unresulted Labs (From admission, onward)    Start     Ordered   06/12/19 0500  Creatinine, serum  (enoxaparin (LOVENOX)    CrCl >/= 30 ml/min)  Weekly,   R    Comments: while on enoxaparin therapy    06/05/19 0306   06/06/19 [id number]  Basic metabolic panel  Tomorrow morning,   R     06/05/19 0306   06/06/19 0500  CBC  Tomorrow morning,   R     06/05/19 0306   06/05/19 0303  HIV Antibody (routine testing w rflx)  (HIV Antibody (Routine testing w reflex) panel)  Once,   STAT     06/05/19 0306   06/05/19 0041  Urine culture  ONCE - STAT,   STAT     06/05/19 0041   06/04/19 2317  CBC with Differential  ONCE - STAT,   STAT     06/04/19 2317   06/04/19 2317  Culture, blood (Routine x 2)  BLOOD CULTURE X 2,   STAT     06/04/19 2317  Vitals/Pain Today's Vitals   06/05/19 0245 06/05/19 0316 06/05/19 0336 06/05/19 0345  BP: (!) 232/215 (!) 175/78  (!) 168/90  Pulse: (!) 111 (!) 108  (!) 109  Resp: (!) 27 (!) 26  (!) 30  Temp:   99.5 F (37.5 C)   TempSrc:   Oral   SpO2: 94% 96%  97%  Weight:      Height:      PainSc:        Isolation Precautions No active isolations  Medications Medications  potassium chloride 10 mEq in 100 mL IVPB (10 mEq Intravenous New Bag/Given 06/05/19 0444)  vancomycin (VANCOCIN) 2,000 mg in sodium chloride 0.9 % 500 mL IVPB (2,000 mg Intravenous New Bag/Given 06/05/19 0321)  acetaminophen (TYLENOL) tablet 500 mg (has no administration in time range)  aspirin EC tablet 81 mg (has no administration in time range)  amLODipine (NORVASC) tablet 2.5 mg (has no administration in time range)  atorvastatin (LIPITOR) tablet 20  mg (has no administration in time range)  hydrochlorothiazide (HYDRODIURIL) tablet 25 mg (has no administration in time range)  losartan (COZAAR) tablet 100 mg (has no administration in time range)  Insulin Glargine (LANTUS) Solostar Pen 240 Units (has no administration in time range)  pantoprazole (PROTONIX) EC tablet 40 mg (has no administration in time range)  potassium chloride SA (KLOR-CON) CR tablet 20 mEq (has no administration in time range)  loratadine (CLARITIN) tablet 10 mg (has no administration in time range)  enoxaparin (LOVENOX) injection 40 mg (has no administration in time range)  senna (SENOKOT) tablet 8.6 mg (has no administration in time range)  insulin aspart (novoLOG) injection 0-20 Units (has no administration in time range)  piperacillin-tazobactam (ZOSYN) IVPB 3.375 g (has no administration in time range)  iohexol (OMNIPAQUE) 350 MG/ML injection 80 mL (80 mLs Intravenous Contrast Given 06/05/19 0059)  ceFEPIme (MAXIPIME) 2 g in sodium chloride 0.9 % 100 mL IVPB (0 g Intravenous Stopped 06/05/19 0241)  metroNIDAZOLE (FLAGYL) IVPB 500 mg (0 mg Intravenous Stopped 06/05/19 0254)  potassium chloride SA (KLOR-CON) CR tablet 60 mEq (60 mEq Oral Given 06/05/19 0123)  albuterol (VENTOLIN HFA) 108 (90 Base) MCG/ACT inhaler 2 puff (2 puffs Inhalation Given 06/05/19 0130)  sodium chloride (PF) 0.9 % injection (  Given by Other 06/05/19 0255)  hydrALAZINE (APRESOLINE) injection 5 mg (5 mg Intravenous Given 06/05/19 0308)  sodium chloride 0.9 % bolus 30 mL/kg (3,500 mLs Intravenous New Bag/Given 06/05/19 0254)    Mobility walks

## 2019-06-05 NOTE — ED Notes (Signed)
ED TO INPATIENT HANDOFF REPORT  Name/Age/Gender [name] 63 y.o. male  Code Status    Code Status Orders  (From admission, onward)         Start     Ordered   06/05/19 0305  Full code  Continuous     06/05/19 0306        Code Status History    This patient has a current code status but no historical code status.   Advance Care Planning Activity    Advance Directive Documentation     Most Recent Value  Type of Advance Directive  Healthcare Power of Attorney  Pre-existing out of facility DNR order (yellow form or pink MOST form)  -  "MOST" Form in Place?  -      Home/SNF/Other Home  Chief Complaint Sepsis  Level of Care/Admitting Diagnosis ED Disposition    ED Disposition Condition [place]: [place] [[id number]]  Level of Care: Stepdown [14]  Admit to SDU based on following criteria: Severe physiological/psychological symptoms:  Any diagnosis requiring assessment & intervention at least every 4 hours on an ongoing basis to obtain desired patient outcomes including stability and rehabilitation  Covid Evaluation: Confirmed COVID Negative  Diagnosis: Sepsis secondary to UTI [id number]  Admitting Physician: Neena Rhymes [[id number]]  Attending Physician: Adella Hare E [[id number]]  Estimated length of stay: 3 - 4 days  Certification:: I certify this patient will need inpatient services for at least 2 midnights  PT Class (Do Not Modify): Inpatient [101]  PT Acc Code (Do Not Modify): Private [1]       Medical History Past Medical History:  Diagnosis Date  . Allergic rhinitis   . At risk for sleep apnea    STOP-BANG= 5   SENT TO PCP 03-14-2014  . Cataract    surgically removed bilateral  . Condyloma acuminatum of penis   . Diabetic neuropathy ([place])   . GERD (gastroesophageal reflux disease)   . History of bladder cancer    s/p  turbt  2013/   transitional cell carcinoma--   . History of condyloma acuminatum     PERINEAL AREA  W/ RECURRENCY  . History of gout   . Hyperlipidemia   . Hypertension   . Lower urinary tract symptoms (LUTS)   . Productive cough   . PVD (peripheral vascular disease) with claudication (HCC)    bilateral SFA disease-- right > left  and left tibial artery disease--  per duplex  . Smokers' cough ([place])   . Type 2 diabetes mellitus with insulin therapy ([place])    monitor by  dr ellsion  . Wears dentures    upper    Allergies No Known Allergies  IV Location/Drains/Wounds Patient Lines/Drains/Airways Status   Active Line/Drains/Airways    Name:   Placement date:   Placement time:   Site:   Days:   Peripheral IV 06/04/19 Right Antecubital   06/04/19    -    Antecubital   1   Peripheral IV 06/04/19 Left Antecubital   06/04/19    2341    Antecubital   1          Labs/Imaging Results for orders placed or performed during the hospital encounter of 06/04/19 (from the past 48 hour(s))  CBC with Differential/Platelet     Status: Abnormal   Collection Time: 06/04/19 11:18 PM  Result Value Ref Range   WBC 21.0 (H) 4.0 - 10.5 K/uL  RBC 4.44 4.22 - 5.81 MIL/uL   Hemoglobin 14.1 13.0 - 17.0 g/dL   HCT 40.7 39.0 - 52.0 %   MCV 91.7 80.0 - 100.0 fL   MCH 31.8 26.0 - 34.0 pg   MCHC 34.6 30.0 - 36.0 g/dL   RDW 12.5 11.5 - 15.5 %   Platelets 218 150 - 400 K/uL   nRBC 0.0 0.0 - 0.2 %   Neutrophils Relative % 86 %   Neutro Abs 18.0 (H) 1.7 - 7.7 K/uL   Lymphocytes Relative 6 %   Lymphs Abs 1.3 0.7 - 4.0 K/uL   Monocytes Relative 7 %   Monocytes Absolute 1.4 (H) 0.1 - 1.0 K/uL   Eosinophils Relative 0 %   Eosinophils Absolute 0.0 0.0 - 0.5 K/uL   Basophils Relative 0 %   Basophils Absolute 0.1 0.0 - 0.1 K/uL   Immature Granulocytes 1 %   Abs Immature Granulocytes 0.26 (H) 0.00 - 0.07 K/uL    Comment: Performed at [facility], [place] [address] [place], [place] [address]  Troponin I (High Sensitivity)     Status: Abnormal   Collection Time: 06/04/19  11:18 PM  Result Value Ref Range   Troponin I (High Sensitivity) 162 (HH) <18 ng/L    Comment: CRITICAL RESULT CALLED TO, READ BACK BY AND VERIFIED WITH: NASH,J AT 0016 ON 06/05/2019 BY MOSLEY,J  (NOTE) Elevated high sensitivity troponin I (hsTnI) values and significant  changes across serial measurements may suggest ACS but many other  chronic and acute conditions are known to elevate hsTnI results.  Refer to the Links section for chest pain algorithms and additional  guidance. Performed at [facility], [place] [address], [place], [place] [address]   Brain natriuretic peptide     Status: Abnormal   Collection Time: 06/04/19 11:18 PM  Result Value Ref Range   B Natriuretic Peptide 112.6 (H) 0.0 - 100.0 pg/mL    Comment: Performed at [facility], [place] [address], [place], [place] [address]  Comprehensive metabolic panel     Status: Abnormal   Collection Time: 06/04/19 11:18 PM  Result Value Ref Range   Sodium 136 135 - 145 mmol/L   Potassium 2.5 (LL) 3.5 - 5.1 mmol/L    Comment: CRITICAL RESULT CALLED TO, READ BACK BY AND VERIFIED WITH: NASH,J AT 0016 ON 06/05/2019 BY MOSLEY,J     Chloride 99 98 - 111 mmol/L   CO2 24 22 - 32 mmol/L   Glucose, Bld 104 (H) 70 - 99 mg/dL   BUN 20 8 - 23 mg/dL   Creatinine, Ser 1.61 (H) 0.61 - 1.24 mg/dL   Calcium 8.1 (L) 8.9 - 10.3 mg/dL   Total Protein 7.3 6.5 - 8.1 g/dL   Albumin 3.4 (L) 3.5 - 5.0 g/dL   AST 48 (H) 15 - 41 U/L   ALT 28 0 - 44 U/L   Alkaline Phosphatase 64 38 - 126 U/L   Total Bilirubin 1.5 (H) 0.3 - 1.2 mg/dL   GFR calc non Af Amer 45 (L) >60 mL/min   GFR calc Af Amer 52 (L) >60 mL/min   Anion gap 13 5 - 15    Comment: Performed at [facility], [place] [address] [place], [place] [address]  Protime-INR     Status: None   Collection Time: 06/04/19 11:18 PM  Result Value Ref Range   Prothrombin Time 14.3 11.4 - 15.2 seconds   INR 1.1 0.8 - 1.2  Comment: (NOTE) INR  goal varies based on device and disease states. Performed at [facility], [place] [address] [place], [place] [address]   Lactic acid, plasma     Status: Abnormal   Collection Time: 06/04/19 11:23 PM  Result Value Ref Range   Lactic Acid, Venous 3.0 (HH) 0.5 - 1.9 mmol/L    Comment: CRITICAL RESULT CALLED TO, READ BACK BY AND VERIFIED WITH: NASH,J AT 0014 ON 06/05/2019 BY MOSLEY,J Performed at [facility], [place] [address] [place], [place] [address]   Urinalysis, Routine w reflex microscopic     Status: Abnormal   Collection Time: 06/04/19 11:23 PM  Result Value Ref Range   Color, Urine YELLOW YELLOW   APPearance CLEAR CLEAR   Specific Gravity, Urine 1.008 1.005 - 1.030   pH 6.0 5.0 - 8.0   Glucose, UA NEGATIVE NEGATIVE mg/dL   Hgb urine dipstick LARGE (A) NEGATIVE   Bilirubin Urine NEGATIVE NEGATIVE   Ketones, ur NEGATIVE NEGATIVE mg/dL   Protein, ur 100 (A) NEGATIVE mg/dL   Nitrite NEGATIVE NEGATIVE   Leukocytes,Ua MODERATE (A) NEGATIVE   RBC / HPF 0-5 0 - 5 RBC/hpf   WBC, UA >50 (H) 0 - 5 WBC/hpf   Bacteria, UA RARE (A) NONE SEEN   Squamous Epithelial / LPF 0-5 0 - 5   WBC Clumps PRESENT    Mucus PRESENT     Comment: Performed at [facility], [place] [address] [place], [place] [address]  I-stat chem 8, ED (not at [facility] or [facility])     Status: Abnormal   Collection Time: 06/04/19 11:36 PM  Result Value Ref Range   Sodium 137 135 - 145 mmol/L   Potassium 2.5 (LL) 3.5 - 5.1 mmol/L   Chloride 98 98 - 111 mmol/L   BUN 19 8 - 23 mg/dL   Creatinine, Ser 1.60 (H) 0.61 - 1.24 mg/dL   Glucose, Bld 96 70 - 99 mg/dL   Calcium, Ion 1.03 (L) 1.15 - 1.40 mmol/L   TCO2 25 22 - 32 mmol/L   Hemoglobin 13.3 13.0 - 17.0 g/dL   HCT 39.0 39.0 - 52.0 %   Comment NOTIFIED PHYSICIAN   Potassium     Status: Abnormal   Collection Time: 06/04/19 11:48 PM  Result Value Ref Range   Potassium 2.8 (L) 3.5 - 5.1 mmol/L    Comment: Performed at  [facility], [place] [address], [place], [place] [address]  Magnesium     Status: Abnormal   Collection Time: 06/04/19 11:48 PM  Result Value Ref Range   Magnesium 1.1 (L) 1.7 - 2.4 mg/dL    Comment: Performed at [facility], [place] [address] [place], [place] [address]  APTT     Status: None   Collection Time: 06/05/19 12:41 AM  Result Value Ref Range   aPTT 31 24 - 36 seconds    Comment: Performed at [facility], [place] [address], [place], [place] [address]  SARS Coronavirus 2 by RT PCR (hospital order, performed in [facility] hospital lab) Nasopharyngeal Nasopharyngeal Swab     Status: None   Collection Time: 06/05/19  1:10 AM   Specimen: Nasopharyngeal Swab  Result Value Ref Range   SARS Coronavirus 2 NEGATIVE NEGATIVE    Comment: (NOTE) If result is NEGATIVE SARS-CoV-2 target nucleic acids are NOT DETECTED. The SARS-CoV-2 RNA is generally detectable in upper and lower  respiratory specimens during the acute phase of infection.  The lowest  concentration of SARS-CoV-2 viral copies this assay can detect is 250  copies / mL. A negative result does not preclude SARS-CoV-2 infection  and should not be used as the sole basis for treatment or other  patient management decisions.  A negative result may occur with  improper specimen collection / handling, submission of specimen other  than nasopharyngeal swab, presence of viral mutation(s) within the  areas targeted by this assay, and inadequate number of viral copies  (<250 copies / mL). A negative result must be combined with clinical  observations, patient history, and epidemiological information. If result is POSITIVE SARS-CoV-2 target nucleic acids are DETECTED. The SARS-CoV-2 RNA is generally detectable in upper and lower  respiratory specimens dur ing the acute phase of infection.  Positive  results are indicative of active infection with SARS-CoV-2.  Clinical  correlation  with patient history and other diagnostic information is  necessary to determine patient infection status.  Positive results do  not rule out bacterial infection or co-infection with other viruses. If result is PRESUMPTIVE POSTIVE SARS-CoV-2 nucleic acids MAY BE PRESENT.   A presumptive positive result was obtained on the submitted specimen  and confirmed on repeat testing.  While 2019 novel coronavirus  (SARS-CoV-2) nucleic acids may be present in the submitted sample  additional confirmatory testing may be necessary for epidemiological  and / or clinical management purposes  to differentiate between  SARS-CoV-2 and other Sar[place]ovirus currently known to infect humans.  If clinically indicated additional testing with an alternate test  methodology [phone]) is advised. The SARS-CoV-2 RNA is generally  detectable in upper and lower respiratory sp ecimens during the acute  phase of infection. The expected result is Negative. Fact Sheet for Patients:  StrictlyIdeas.no Fact Sheet for Healthcare Providers: BankingDealers.co.za This test is not yet approved or cleared by the [place] FDA and has been authorized for detection and/or diagnosis of SARS-CoV-2 by FDA under an Emergency Use Authorization (EUA).  This EUA will remain in effect (meaning this test can be used) for the duration of the COVID-19 declaration under Section 564(b)(1) of the Act, 21 U.S.C. section 360bbb-3(b)(1), unless the authorization is terminated or revoked sooner. Performed at [facility], [place] [address] [place], [place] [address]    Ct Angio Chest Pe W And/or Wo Contrast  Result Date: 06/05/2019 CLINICAL DATA:  63 year old male with increased shortness of breath and fever. EXAM: CT ANGIOGRAPHY CHEST CT ABDOMEN AND PELVIS WITH CONTRAST TECHNIQUE: Multidetector CT imaging of the chest was performed using the standard protocol during bolus  administration of intravenous contrast. Multiplanar CT image reconstructions and MIPs were obtained to evaluate the vascular anatomy. Multidetector CT imaging of the abdomen and pelvis was performed using the standard protocol during bolus administration of intravenous contrast. CONTRAST:  28mL OMNIPAQUE IOHEXOL 350 MG/ML SOLN COMPARISON:  CT of the abdomen pelvis dated 05/01/2017 and chest radiograph dated 06/05/2019 FINDINGS: CTA CHEST FINDINGS Cardiovascular: There is no cardiomegaly or pericardial effusion. Coronary vascular calcification of the LAD. Mild atherosclerotic calcification of the thoracic aorta. No aneurysmal dilatation or dissection. Evaluation of the pulmonary arteries is very limited due to suboptimal opacification and timing of the contrast as well as respiratory motion artifact. No definite large central pulmonary artery embolus identified. Mediastinum/Nodes: There is no hilar or mediastinal adenopathy. The esophagus is grossly unremarkable. No mediastinal fluid collection. Lungs/Pleura: Minimal left lung base linear atelectasis/scarring. No focal consolidation, pleural effusion, or pneumothorax. The central airways are patent. Musculoskeletal: No  acute osseous pathology. Review of the MIP images confirms the above findings. CT ABDOMEN and PELVIS FINDINGS No intra-abdominal free air or free fluid. Hepatobiliary: Diffuse fatty infiltration of the liver. No intrahepatic biliary ductal dilatation. There is sludge or noncalcified stone in the gallbladder. No pericholecystic fluid or evidence of acute cholecystitis by CT. Pancreas: Unremarkable. No pancreatic ductal dilatation or surrounding inflammatory changes. Spleen: Normal in size without focal abnormality. Adrenals/Urinary Tract: The adrenal glands are unremarkable. There is no hydronephrosis on either side. There is symmetric enhancement and excretion of contrast by both kidneys. The visualized ureters and urinary bladder appear unremarkable.  Minimal bilateral perinephric stranding, nonspecific. Correlation with urinalysis recommended to exclude UTI. Stomach/Bowel: There is sigmoid diverticulosis without active inflammatory changes. There is no bowel obstruction or active inflammation. The appendix is normal. Vascular/Lymphatic: Advanced aortoiliac atherosclerotic disease. The IVC is unremarkable. No portal venous gas. There is no adenopathy. Reproductive: Enlarged prostate gland measuring 6 cm in transverse axial diameter. The seminal vesicles are symmetric. Other: None Musculoskeletal: Degenerative changes of the spine. No acute osseous pathology. Review of the MIP images confirms the above findings. IMPRESSION: 1. No acute intrathoracic, abdominal, or pelvic pathology. No CT evidence of central pulmonary artery embolus. 2. Fatty liver. 3. Sigmoid diverticulosis. No bowel obstruction or active inflammation. Normal appendix. 4. Gallbladder sludge or noncalcified stones. Aortic Atherosclerosis (ICD10-I70.0). Electronically Signed   By: Anner Crete M.D.   On: 06/05/2019 01:54   Ct Abdomen Pelvis W Contrast  Result Date: 06/05/2019 CLINICAL DATA:  63 year old male with increased shortness of breath and fever. EXAM: CT ANGIOGRAPHY CHEST CT ABDOMEN AND PELVIS WITH CONTRAST TECHNIQUE: Multidetector CT imaging of the chest was performed using the standard protocol during bolus administration of intravenous contrast. Multiplanar CT image reconstructions and MIPs were obtained to evaluate the vascular anatomy. Multidetector CT imaging of the abdomen and pelvis was performed using the standard protocol during bolus administration of intravenous contrast. CONTRAST:  16mL OMNIPAQUE IOHEXOL 350 MG/ML SOLN COMPARISON:  CT of the abdomen pelvis dated 05/01/2017 and chest radiograph dated 06/05/2019 FINDINGS: CTA CHEST FINDINGS Cardiovascular: There is no cardiomegaly or pericardial effusion. Coronary vascular calcification of the LAD. Mild atherosclerotic  calcification of the thoracic aorta. No aneurysmal dilatation or dissection. Evaluation of the pulmonary arteries is very limited due to suboptimal opacification and timing of the contrast as well as respiratory motion artifact. No definite large central pulmonary artery embolus identified. Mediastinum/Nodes: There is no hilar or mediastinal adenopathy. The esophagus is grossly unremarkable. No mediastinal fluid collection. Lungs/Pleura: Minimal left lung base linear atelectasis/scarring. No focal consolidation, pleural effusion, or pneumothorax. The central airways are patent. Musculoskeletal: No acute osseous pathology. Review of the MIP images confirms the above findings. CT ABDOMEN and PELVIS FINDINGS No intra-abdominal free air or free fluid. Hepatobiliary: Diffuse fatty infiltration of the liver. No intrahepatic biliary ductal dilatation. There is sludge or noncalcified stone in the gallbladder. No pericholecystic fluid or evidence of acute cholecystitis by CT. Pancreas: Unremarkable. No pancreatic ductal dilatation or surrounding inflammatory changes. Spleen: Normal in size without focal abnormality. Adrenals/Urinary Tract: The adrenal glands are unremarkable. There is no hydronephrosis on either side. There is symmetric enhancement and excretion of contrast by both kidneys. The visualized ureters and urinary bladder appear unremarkable. Minimal bilateral perinephric stranding, nonspecific. Correlation with urinalysis recommended to exclude UTI. Stomach/Bowel: There is sigmoid diverticulosis without active inflammatory changes. There is no bowel obstruction or active inflammation. The appendix is normal. Vascular/Lymphatic: Advanced aortoiliac atherosclerotic disease. The IVC is  unremarkable. No portal venous gas. There is no adenopathy. Reproductive: Enlarged prostate gland measuring 6 cm in transverse axial diameter. The seminal vesicles are symmetric. Other: None Musculoskeletal: Degenerative changes of  the spine. No acute osseous pathology. Review of the MIP images confirms the above findings. IMPRESSION: 1. No acute intrathoracic, abdominal, or pelvic pathology. No CT evidence of central pulmonary artery embolus. 2. Fatty liver. 3. Sigmoid diverticulosis. No bowel obstruction or active inflammation. Normal appendix. 4. Gallbladder sludge or noncalcified stones. Aortic Atherosclerosis (ICD10-I70.0). Electronically Signed   By: Anner Crete M.D.   On: 06/05/2019 01:54   Dg Chest Portable 1 View  Result Date: 06/05/2019 CLINICAL DATA:  63 year old male with shortness of breath. EXAM: PORTABLE CHEST 1 VIEW COMPARISON:  Chest radiograph dated 10/02/2014 FINDINGS: There is blunting of the left costophrenic angle which may represent trace left pleural effusion. Left lung base densities, likely atelectatic changes. Developing infiltrate is not excluded. Clinical correlation is recommended. The right lung is clear. There is no pneumothorax. The cardiac silhouette is within normal limits. No acute osseous pathology. IMPRESSION: Probable trace left pleural effusion and left lung base atelectasis. Developing infiltrate is not excluded. Clinical correlation recommended. Electronically Signed   By: Anner Crete M.D.   On: 06/05/2019 00:17    Pending Labs Unresulted Labs (From admission, onward)    Start     Ordered   06/12/19 0500  Creatinine, serum  (enoxaparin (LOVENOX)    CrCl >/= 30 ml/min)  Weekly,   R    Comments: while on enoxaparin therapy    06/05/19 0306   06/06/19 [id number]  Basic metabolic panel  Tomorrow morning,   R     06/05/19 0306   06/06/19 0500  CBC  Tomorrow morning,   R     06/05/19 0306   06/05/19 0303  HIV Antibody (routine testing w rflx)  (HIV Antibody (Routine testing w reflex) panel)  Once,   STAT     06/05/19 0306   06/05/19 0041  Urine culture  ONCE - STAT,   STAT     06/05/19 0041   06/04/19 2317  Lactic acid, plasma  Now then every 2 hours,   STAT     06/04/19 2317    06/04/19 2317  CBC with Differential  ONCE - STAT,   STAT     06/04/19 2317   06/04/19 2317  Culture, blood (Routine x 2)  BLOOD CULTURE X 2,   STAT     06/04/19 2317          Vitals/Pain Today's Vitals   06/05/19 0245 06/05/19 0316 06/05/19 0336 06/05/19 0345  BP: (!) 232/215 (!) 175/78  (!) 168/90  Pulse: (!) 111 (!) 108  (!) 109  Resp: (!) 27 (!) 26  (!) 30  Temp:   99.5 F (37.5 C)   TempSrc:   Oral   SpO2: 94% 96%  97%  Weight:      Height:      PainSc:        Isolation Precautions No active isolations  Medications Medications  potassium chloride 10 mEq in 100 mL IVPB (10 mEq Intravenous New Bag/Given 06/05/19 0337)  vancomycin (VANCOCIN) 2,000 mg in sodium chloride 0.9 % 500 mL IVPB (2,000 mg Intravenous New Bag/Given 06/05/19 0321)  acetaminophen (TYLENOL) tablet 500 mg (has no administration in time range)  aspirin EC tablet 81 mg (has no administration in time range)  amLODipine (NORVASC) tablet 2.5 mg (has no administration in time range)  atorvastatin (LIPITOR) tablet 20 mg (has no administration in time range)  hydrochlorothiazide (HYDRODIURIL) tablet 25 mg (has no administration in time range)  losartan (COZAAR) tablet 100 mg (has no administration in time range)  Insulin Glargine (LANTUS) Solostar Pen 240 Units (has no administration in time range)  pantoprazole (PROTONIX) EC tablet 40 mg (has no administration in time range)  potassium chloride SA (KLOR-CON) CR tablet 20 mEq (has no administration in time range)  loratadine (CLARITIN) tablet 10 mg (has no administration in time range)  enoxaparin (LOVENOX) injection 40 mg (has no administration in time range)  senna (SENOKOT) tablet 8.6 mg (has no administration in time range)  iohexol (OMNIPAQUE) 350 MG/ML injection 80 mL (80 mLs Intravenous Contrast Given 06/05/19 0059)  ceFEPIme (MAXIPIME) 2 g in sodium chloride 0.9 % 100 mL IVPB (0 g Intravenous Stopped 06/05/19 0241)  metroNIDAZOLE (FLAGYL) IVPB 500 mg  (0 mg Intravenous Stopped 06/05/19 0254)  potassium chloride SA (KLOR-CON) CR tablet 60 mEq (60 mEq Oral Given 06/05/19 0123)  albuterol (VENTOLIN HFA) 108 (90 Base) MCG/ACT inhaler 2 puff (2 puffs Inhalation Given 06/05/19 0130)  sodium chloride (PF) 0.9 % injection (  Given by Other 06/05/19 0255)  hydrALAZINE (APRESOLINE) injection 5 mg (5 mg Intravenous Given 06/05/19 0308)  sodium chloride 0.9 % bolus 30 mL/kg (3,500 mLs Intravenous New Bag/Given 06/05/19 0254)    Mobility walks

## 2019-06-05 NOTE — Progress Notes (Addendum)
Inpatient Diabetes Program Recommendations  AACE/ADA: New Consensus Statement on Inpatient Glycemic Control (2015)  Target Ranges:  Prepandial:   less than 140 mg/dL      Peak postprandial:   less than 180 mg/dL (1-2 hours)      Critically ill patients:  140 - 180 mg/dL   Lab Results  Component Value Date   GLUCAP 63 (L) 06/05/2019   HGBA1C 8.7 (H) 04/21/2019    Review of Glycemic Control  Diabetes history: type 2 Outpatient Diabetes medications: Lantus 240 units every am, metformin 500 mg at breakfast Current orders for Inpatient glycemic control:   Inpatient Diabetes Program Recommendations:   Received diabetes coordinator consult.   Noted that patient takes Lantus 240 units every am at home.  Recommend while in hospital, Lantus 60 units BID (1/2 of his home dose) and Novolog RESISTANT correction scale TID.  The Lantus could be given at 1000 and HS, starting the Lantus dose tonight at HS.  Noted that blood sugars are less than 180 mg/dl. May need to readjust dosages as needed.   Will continue to monitor his blood sugars while in the hospital.  Harvel Ricks RN BSN CDE Diabetes Coordinator Pager: [phone]  8am-5pm

## 2019-06-05 NOTE — Progress Notes (Addendum)
Pharmacy Antibiotic Note  [name] is a 63 y.o. male admitted on 06/04/2019 with sepsis secondary to UTI, pyelonephritis with recent instrumentation .  Pharmacy has been consulted for cefepime and vancomycin dosing. WBC 22.1, SCr 1.6> 1.27, AF  Plan: Cefepime 2 gm IV q8h Flagyl 500 mg IV q8h per MD Vancomycin 2000 mg IV x 1 given 0321 am Vancomycin 1500 mg IV Q 24 hrs. Goal AUC 400-550. Expected AUC: 479.6 SCr used: 1.27 Vd 0.5. wt 118.4 kg F/u culture data, renal fxn, WBC, temp Vancomycin levels as needed  Height: 5\' 8"  (172.7 cm) Weight: 261 lb 0.4 oz (118.4 kg) IBW/kg (Calculated) : 68.4  Temp (24hrs), Avg:99.5 F (37.5 C), Min:99 F (37.2 C), Max:100.3 F (37.9 C)  Recent Labs  Lab 06/04/19 2318 06/04/19 2323 06/04/19 2336 06/05/19 0318 06/05/19 1047  WBC 21.0*  --   --   --  22.1*  CREATININE 1.61*  --  1.60*  --  1.27*  LATICACIDVEN  --  3.0*  --  1.5  --     Estimated Creatinine Clearance: 75.4 mL/min (A) (by C-G formula based on SCr of 1.27 mg/dL (H)).    No Known Allergies Antimicrobials this admission:  10/25 cefepime>> 10/25 zosyn x 2 doses 10/25 vanc >> 10/25 flagyl>>  Dose adjustments this admission:   Microbiology results:  10/25 HIV neg 10/25 MRSA PCR neg 10/25 [id number] 10/24 BCx2: sent  Thank you for allowing pharmacy to be a part of this patient's care.  Eudelia Bunch, Pharm.D [phone] 06/05/2019 5:07 PM

## 2019-06-05 NOTE — ED Notes (Signed)
Date and time results received: 06/05/19 0017 (use smartphrase ".now" to insert current time)  Test:K+ Critical Value:2.5  Name of Provider Notified: Palumbo Md  Orders Received? Or Actions Taken?:waiting on orders

## 2019-06-05 NOTE — Progress Notes (Signed)
A consult was received from an ED physician for Vancomycin, cefepime per pharmacy dosing.  The patient's profile has been reviewed for ht/wt/allergies/indication/available labs.   A one time order has been placed for Vancomycin 2gm iv x1, and Cefepime 2gm iv x1.  Further antibiotics/pharmacy consults should be ordered by admitting physician if indicated.                       Thank you, Nani Skillern Crowford 06/05/2019  1:01 AM

## 2019-06-05 NOTE — ED Provider Notes (Signed)
[facility] DEPT Provider Note   CSN: [id number] Arrival date & time: 06/04/19  2241     History   Chief Complaint Chief Complaint  Patient presents with  . Shortness of Breath    HPI [name] is a 63 y.o. male.     The history is provided by the patient.  Shortness of Breath Severity:  Moderate Onset quality:  Gradual Duration:  2 days Timing:  Constant Progression:  Worsening Chronicity:  New Context: not animal exposure, not emotional upset and not fumes   Relieved by:  Nothing Worsened by:  Nothing Ineffective treatments:  None tried Associated symptoms: no abdominal pain, no chest pain, no cough, no diaphoresis, no fever, no neck pain, no rash, no sore throat, no sputum production, no syncope and no vomiting   Risk factors: obesity     Past Medical History:  Diagnosis Date  . Allergic rhinitis   . At risk for sleep apnea    STOP-BANG= 5   SENT TO PCP 03-14-2014  . Cataract    surgically removed bilateral  . Condyloma acuminatum of penis   . Diabetic neuropathy ([place])   . GERD (gastroesophageal reflux disease)   . History of bladder cancer    s/p  turbt  2013/   transitional cell carcinoma--   . History of condyloma acuminatum    PERINEAL AREA  W/ RECURRENCY  . History of gout   . Hyperlipidemia   . Hypertension   . Lower urinary tract symptoms (LUTS)   . Productive cough   . PVD (peripheral vascular disease) with claudication (HCC)    bilateral SFA disease-- right > left  and left tibial artery disease--  per duplex  . Smokers' cough ([place])   . Type 2 diabetes mellitus with insulin therapy ([place])    monitor by  dr ellsion  . Wears dentures    upper    Patient Active Problem List   Diagnosis Date Noted  . Olecranon bursitis of right elbow 04/21/2019  . Laceration of skin of right lower leg 06/11/2016  . Diabetes ([place]) 10/02/2015  . Back pain 04/02/2015  . Dyslipidemia 01/01/2015  . Atypical chest pain  07/18/2014  . Encounter for well adult exam with abnormal findings 07/03/2014  . PAD (peripheral artery disease) ([place]) 04/05/2014  . Folliculitis of scrotum 04/03/2014  . Pain in joint, lower leg 04/03/2014  . Edema of posterior shaft of penis 01/31/2014  . Bladder cancer ([place]) 05/21/2012  . Gross hematuria 03/25/2012  . Encounter for long-term (current) use of other medications 02/02/2012  . Screening for prostate cancer 02/02/2012  . Hepatomegaly 09/10/2010  . ABNORMAL ELECTROCARDIOGRAM 09/10/2010  . FOOT PAIN, LEFT 05/02/2010  . ASTHMA 07/25/2009  . Tobacco abuse 05/22/2009  . Cough 05/11/2008  . Condylomata of penis, groins, perianal - recurrent 01/17/2008  . SHOULDER PAIN, LEFT, CHRONIC 01/17/2008  . HYPERCHOLESTEROLEMIA 10/06/2007  . Essential hypertension 03/06/2007  . ALLERGIC RHINITIS 03/06/2007    Past Surgical History:  Procedure Laterality Date  . AXILLARY HIDRADENITIS EXCISION  1997  . CARDIOVASCULAR STRESS TEST  07-24-2014  dr Kathlyn [place]   Low risk lexiscan nuclear study with apical thinning and small inferolateral wall infarct at mid & basal level , no ischemia/  normal LVF and wall motion , ef 59%  . CATARACT EXTRACTION Left   . CATARACT EXTRACTION W/ INTRAOCULAR LENS IMPLANT Right   . CO2 LASER APPLICATION N/A [id number]   Procedure: CO2 LASER APPLICATION,PENIS, GROIN, ANUS;  Surgeon: [name], MD;  Location: [facility];  Service: General;  Laterality: N/A;  . CO2 LASER APPLICATION N/A [id number]   Procedure: CO2 LASER APPLICATION;  Surgeon: Kathie Rhodes, MD;  Location: [facility];  Service: Urology;  Laterality: N/A;  . CONDYLOMA EXCISION/FULGURATION N/A 05/21/2015   Procedure: CONDYLOMA REMOVAL;  Surgeon: Kathie Rhodes, MD;  Location: [facility];  Service: Urology;  Laterality: N/A;  . HEMORRHOID SURGERY  10/24/2014   Procedure: HEMORRHOIDECTOMY;  Surgeon: Michael Boston, MD;  Location: [facility];  Service: General;;  . INCISION AND DRAINAGE ABSCESS Left 10/24/2014   Procedure: INCISION AND DRAINAGE ABSCESS;  Surgeon: Michael Boston, MD;  Location: [facility];  Service: General;  Laterality: Left;  . INGUINAL HIDRADENITIS EXCISION  1998, 1999  . LASER ABLATION CONDOLAMATA N/A 03/20/2014   Procedure: EXAM UNDER ANESTHESIA, REMOVAL/ABLATION OF CONDYLOMATA PENIS,GROINS, ANUS, ANAL CANAL;  Surgeon: [name], MD;  Location: [place];  Service: General;  Laterality: N/A;  groin and anus  . LASER ABLATION CONDOLAMATA N/A 10/24/2014   Procedure: LASER ABLATION CONDOLAMATA;  Surgeon: Michael Boston, MD;  Location: [facility];  Service: General;  Laterality: N/A;  . LASER ABLATION OF PENILE AND PERIANAL WARTS  07-29-2007  Dr. Johney Maine  . LEFT SHOULDER SURGERY  2003  . MASS EXCISION N/A 10/24/2014   Procedure: EXCISION OF PERINEAL MASS/SINUS;  Surgeon: Michael Boston, MD;  Location: [place];  Service: General;  Laterality: N/A;  . MOHS SURGERY     back  . MOHS SURGERY  2017   face  . MULTIPLE TOOTH EXTRACTIONS    . PERINEAL HIDRADENITIS EXCISION  1998, 1999  . TRANSURETHRAL RESECTION OF BLADDER TUMOR  05/21/2012   Procedure: TRANSURETHRAL RESECTION OF BLADDER TUMOR (TURBT);  Surgeon: Claybon Jabs, MD;  Location: [facility];  Service: Urology;  Laterality: N/A;           Home Medications    Prior to Admission medications   Medication Sig Start Date End Date Taking? Authorizing Provider  acetaminophen (TYLENOL) 500 MG tablet Take 500 mg by mouth every 6 (six) hours as needed.   Yes [provider]  amLODipine (NORVASC) 2.5 MG tablet Take 1 tablet (2.5 mg total) by mouth daily. 04/21/19  Yes Biagio Borg, MD  aspirin EC 81 MG tablet Take 1 tablet (81 mg total) by mouth daily. 11/25/16  Yes Wellington Hampshire, MD  atorvastatin (LIPITOR) 20 MG tablet Take 1 tablet (20 mg total) by mouth daily.  04/21/19  Yes Biagio Borg, MD  hydrochlorothiazide (HYDRODIURIL) 25 MG tablet Take 1 tablet (25 mg total) by mouth daily. 04/21/19  Yes Biagio Borg, MD  Insulin Glargine (LANTUS SOLOSTAR) 100 UNIT/ML Solostar Pen Inject 250 Units into the skin every morning. Patient taking differently: Inject 240 Units into the skin every morning.  09/27/18  Yes [name], MD  loratadine (CLARITIN) 10 MG tablet Take 10 mg by mouth daily.   Yes [provider]  losartan (COZAAR) 100 MG tablet Take 1 tablet (100 mg total) by mouth daily. 04/21/19  Yes Biagio Borg, MD  metFORMIN (GLUCOPHAGE-XR) 500 MG 24 hr tablet Take 1 tablet by mouth once daily with breakfast Patient taking differently: Take 500 mg by mouth daily with breakfast.  04/21/19  Yes Biagio Borg, MD  pantoprazole (PROTONIX) 40 MG tablet Take 1 tablet (40 mg total) by mouth daily.  04/21/19  Yes Biagio Borg, MD  potassium chloride 20 MEQ TBCR Take 20 mEq by mouth daily. 04/06/18  Yes Wellington Hampshire, MD  Vitamin D, Ergocalciferol, (DRISDOL) 1.25 MG (50000 UT) CAPS capsule Take 1 capsule (50,000 Units total) by mouth every 7 (seven) days. Patient taking differently: Take 50,000 Units by mouth every Friday.  04/21/19  Yes Biagio Borg, MD    Family History Family History  Problem Relation Age of Onset  . Hypertension Mother   . Cancer Father        lung ca  . Diabetes Maternal Aunt        x 2  . Colon cancer Neg Hx   . Esophageal cancer Neg Hx   . Pancreatic cancer Neg Hx   . Prostate cancer Neg Hx   . Kidney disease Neg Hx   . Liver disease Neg Hx   . Lung cancer Neg Hx   . Rectal cancer Neg Hx   . Stomach cancer Neg Hx     Social History Social History   Tobacco Use  . Smoking status: Former Smoker    Packs/day: 1.00    Years: 41.00    Pack years: 41.00    Types: Cigars, Cigarettes    Quit date: 04/11/2016    Years since quitting: 3.1  . Smokeless tobacco: Never Used  Substance Use Topics  . Alcohol use: No     Alcohol/week: 0.0 standard drinks  . Drug use: No     Allergies   Patient has no known allergies.   Review of Systems Review of Systems  Constitutional: Positive for chills. Negative for diaphoresis and fever.  HENT: Negative for congestion and sore throat.   Eyes: Negative for visual disturbance.  Respiratory: Positive for shortness of breath. Negative for cough and sputum production.   Cardiovascular: Negative for chest pain, palpitations, leg swelling and syncope.  Gastrointestinal: Negative for abdominal pain and vomiting.  Genitourinary: Positive for difficulty urinating and urgency.  Musculoskeletal: Negative for neck pain.  Skin: Positive for wound. Negative for rash.  Neurological: Negative for dizziness.  Psychiatric/Behavioral: Negative for agitation.     Physical Exam Updated Vital Signs BP (!) 183/70   Pulse (!) 115   Temp 100.2 F (37.9 C) (Oral)   Resp (!) 41   SpO2 93%   Physical Exam Vitals signs and nursing note reviewed.  Constitutional:      Appearance: He is obese. He is not diaphoretic.  HENT:     Head: Normocephalic and atraumatic.     Nose: Nose normal.  Eyes:     Conjunctiva/sclera: Conjunctivae normal.     Pupils: Pupils are equal, round, and reactive to light.  Neck:     Musculoskeletal: Normal range of motion and neck supple.  Cardiovascular:     Rate and Rhythm: Regular rhythm. Tachycardia present.     Pulses: Normal pulses.     Heart sounds: Normal heart sounds.  Pulmonary:     Effort: Pulmonary effort is normal. No respiratory distress.     Breath sounds: Normal breath sounds. No rales.  Abdominal:     General: Abdomen is flat. Bowel sounds are normal.     Tenderness: There is no abdominal tenderness. There is no guarding.  Musculoskeletal: Normal range of motion.  Skin:    General: Skin is warm and dry.     Capillary Refill: Capillary refill takes less than 2 seconds.     Findings: Ecchymosis and erythema present.  Neurological:     General: No focal deficit present.     Mental Status: He is alert and oriented to person, place, and time.     Deep Tendon Reflexes: Reflexes normal.  Psychiatric:        Mood and Affect: Mood normal.        Behavior: Behavior normal.      ED Treatments / Results  Labs (all labs ordered are listed, but only abnormal results are displayed) Labs Reviewed  CBC WITH DIFFERENTIAL/PLATELET - Abnormal; Notable for the following components:      Result Value   WBC 21.0 (*)    Neutro Abs 18.0 (*)    Monocytes Absolute 1.4 (*)    Abs Immature Granulocytes 0.26 (*)    All other components within normal limits  BRAIN NATRIURETIC PEPTIDE - Abnormal; Notable for the following components:   B Natriuretic Peptide 112.6 (*)    All other components within normal limits  COMPREHENSIVE METABOLIC PANEL - Abnormal; Notable for the following components:   Potassium 2.5 (*)    Glucose, Bld 104 (*)    Creatinine, Ser 1.61 (*)    Calcium 8.1 (*)    Albumin 3.4 (*)    AST 48 (*)    Total Bilirubin 1.5 (*)    GFR calc non Af Amer 45 (*)    GFR calc Af Amer 52 (*)    All other components within normal limits  LACTIC ACID, PLASMA - Abnormal; Notable for the following components:   Lactic Acid, Venous 3.0 (*)    All other components within normal limits  URINALYSIS, ROUTINE W REFLEX MICROSCOPIC - Abnormal; Notable for the following components:   Hgb urine dipstick LARGE (*)    Protein, ur 100 (*)    Leukocytes,Ua MODERATE (*)    WBC, UA >50 (*)    Bacteria, UA RARE (*)    All other components within normal limits  POTASSIUM - Abnormal; Notable for the following components:   Potassium 2.8 (*)    All other components within normal limits  MAGNESIUM - Abnormal; Notable for the following components:   Magnesium 1.1 (*)    All other components within normal limits  I-STAT CHEM 8, ED - Abnormal; Notable for the following components:   Potassium 2.5 (*)    Creatinine, Ser 1.60  (*)    Calcium, Ion 1.03 (*)    All other components within normal limits  TROPONIN I (HIGH SENSITIVITY) - Abnormal; Notable for the following components:   Troponin I (High Sensitivity) 162 (*)    All other components within normal limits  CULTURE, BLOOD (ROUTINE X 2)  CULTURE, BLOOD (ROUTINE X 2)  SARS CORONAVIRUS 2 BY RT PCR (HOSPITAL ORDER, [name] [facility])  URINE CULTURE  PROTIME-INR  APTT  LACTIC ACID, PLASMA  CBC WITH DIFFERENTIAL/PLATELET  TROPONIN I (HIGH SENSITIVITY)    EKG EKG Interpretation  Date/Time:  Saturday June 04 2019 23:18:00 EDT Ventricular Rate:  107 PR Interval:    QRS Duration: 84 QT Interval:  298 QTC Calculation: 398 R Axis:   -27 Text Interpretation:  Sinus tachycardia Supraventricular bigeminy Borderline left axis deviation Anterior infarct, old Repol abnrm suggests ischemia, lateral leads Rate faster than previous Ectopy new compared to previous Confirmed by Pryor Curia [phone]) on 06/05/2019 12:24:47 AM   Radiology Ct Angio Chest Pe W And/or Wo Contrast  Result Date: 06/05/2019 CLINICAL DATA:  63 year old male with increased shortness of breath and fever. EXAM: CT ANGIOGRAPHY  CHEST CT ABDOMEN AND PELVIS WITH CONTRAST TECHNIQUE: Multidetector CT imaging of the chest was performed using the standard protocol during bolus administration of intravenous contrast. Multiplanar CT image reconstructions and MIPs were obtained to evaluate the vascular anatomy. Multidetector CT imaging of the abdomen and pelvis was performed using the standard protocol during bolus administration of intravenous contrast. CONTRAST:  68mL OMNIPAQUE IOHEXOL 350 MG/ML SOLN COMPARISON:  CT of the abdomen pelvis dated 05/01/2017 and chest radiograph dated 06/05/2019 FINDINGS: CTA CHEST FINDINGS Cardiovascular: There is no cardiomegaly or pericardial effusion. Coronary vascular calcification of the LAD. Mild atherosclerotic calcification of the thoracic aorta.  No aneurysmal dilatation or dissection. Evaluation of the pulmonary arteries is very limited due to suboptimal opacification and timing of the contrast as well as respiratory motion artifact. No definite large central pulmonary artery embolus identified. Mediastinum/Nodes: There is no hilar or mediastinal adenopathy. The esophagus is grossly unremarkable. No mediastinal fluid collection. Lungs/Pleura: Minimal left lung base linear atelectasis/scarring. No focal consolidation, pleural effusion, or pneumothorax. The central airways are patent. Musculoskeletal: No acute osseous pathology. Review of the MIP images confirms the above findings. CT ABDOMEN and PELVIS FINDINGS No intra-abdominal free air or free fluid. Hepatobiliary: Diffuse fatty infiltration of the liver. No intrahepatic biliary ductal dilatation. There is sludge or noncalcified stone in the gallbladder. No pericholecystic fluid or evidence of acute cholecystitis by CT. Pancreas: Unremarkable. No pancreatic ductal dilatation or surrounding inflammatory changes. Spleen: Normal in size without focal abnormality. Adrenals/Urinary Tract: The adrenal glands are unremarkable. There is no hydronephrosis on either side. There is symmetric enhancement and excretion of contrast by both kidneys. The visualized ureters and urinary bladder appear unremarkable. Minimal bilateral perinephric stranding, nonspecific. Correlation with urinalysis recommended to exclude UTI. Stomach/Bowel: There is sigmoid diverticulosis without active inflammatory changes. There is no bowel obstruction or active inflammation. The appendix is normal. Vascular/Lymphatic: Advanced aortoiliac atherosclerotic disease. The IVC is unremarkable. No portal venous gas. There is no adenopathy. Reproductive: Enlarged prostate gland measuring 6 cm in transverse axial diameter. The seminal vesicles are symmetric. Other: None Musculoskeletal: Degenerative changes of the spine. No acute osseous pathology.  Review of the MIP images confirms the above findings. IMPRESSION: 1. No acute intrathoracic, abdominal, or pelvic pathology. No CT evidence of central pulmonary artery embolus. 2. Fatty liver. 3. Sigmoid diverticulosis. No bowel obstruction or active inflammation. Normal appendix. 4. Gallbladder sludge or noncalcified stones. Aortic Atherosclerosis (ICD10-I70.0). Electronically Signed   By: Anner Crete M.D.   On: 06/05/2019 01:54   Ct Abdomen Pelvis W Contrast  Result Date: 06/05/2019 CLINICAL DATA:  63 year old male with increased shortness of breath and fever. EXAM: CT ANGIOGRAPHY CHEST CT ABDOMEN AND PELVIS WITH CONTRAST TECHNIQUE: Multidetector CT imaging of the chest was performed using the standard protocol during bolus administration of intravenous contrast. Multiplanar CT image reconstructions and MIPs were obtained to evaluate the vascular anatomy. Multidetector CT imaging of the abdomen and pelvis was performed using the standard protocol during bolus administration of intravenous contrast. CONTRAST:  46mL OMNIPAQUE IOHEXOL 350 MG/ML SOLN COMPARISON:  CT of the abdomen pelvis dated 05/01/2017 and chest radiograph dated 06/05/2019 FINDINGS: CTA CHEST FINDINGS Cardiovascular: There is no cardiomegaly or pericardial effusion. Coronary vascular calcification of the LAD. Mild atherosclerotic calcification of the thoracic aorta. No aneurysmal dilatation or dissection. Evaluation of the pulmonary arteries is very limited due to suboptimal opacification and timing of the contrast as well as respiratory motion artifact. No definite large central pulmonary artery embolus identified. Mediastinum/Nodes: There is no  hilar or mediastinal adenopathy. The esophagus is grossly unremarkable. No mediastinal fluid collection. Lungs/Pleura: Minimal left lung base linear atelectasis/scarring. No focal consolidation, pleural effusion, or pneumothorax. The central airways are patent. Musculoskeletal: No acute osseous  pathology. Review of the MIP images confirms the above findings. CT ABDOMEN and PELVIS FINDINGS No intra-abdominal free air or free fluid. Hepatobiliary: Diffuse fatty infiltration of the liver. No intrahepatic biliary ductal dilatation. There is sludge or noncalcified stone in the gallbladder. No pericholecystic fluid or evidence of acute cholecystitis by CT. Pancreas: Unremarkable. No pancreatic ductal dilatation or surrounding inflammatory changes. Spleen: Normal in size without focal abnormality. Adrenals/Urinary Tract: The adrenal glands are unremarkable. There is no hydronephrosis on either side. There is symmetric enhancement and excretion of contrast by both kidneys. The visualized ureters and urinary bladder appear unremarkable. Minimal bilateral perinephric stranding, nonspecific. Correlation with urinalysis recommended to exclude UTI. Stomach/Bowel: There is sigmoid diverticulosis without active inflammatory changes. There is no bowel obstruction or active inflammation. The appendix is normal. Vascular/Lymphatic: Advanced aortoiliac atherosclerotic disease. The IVC is unremarkable. No portal venous gas. There is no adenopathy. Reproductive: Enlarged prostate gland measuring 6 cm in transverse axial diameter. The seminal vesicles are symmetric. Other: None Musculoskeletal: Degenerative changes of the spine. No acute osseous pathology. Review of the MIP images confirms the above findings. IMPRESSION: 1. No acute intrathoracic, abdominal, or pelvic pathology. No CT evidence of central pulmonary artery embolus. 2. Fatty liver. 3. Sigmoid diverticulosis. No bowel obstruction or active inflammation. Normal appendix. 4. Gallbladder sludge or noncalcified stones. Aortic Atherosclerosis (ICD10-I70.0). Electronically Signed   By: Anner Crete M.D.   On: 06/05/2019 01:54   Dg Chest Portable 1 View  Result Date: 06/05/2019 CLINICAL DATA:  63 year old male with shortness of breath. EXAM: PORTABLE CHEST 1 VIEW  COMPARISON:  Chest radiograph dated 10/02/2014 FINDINGS: There is blunting of the left costophrenic angle which may represent trace left pleural effusion. Left lung base densities, likely atelectatic changes. Developing infiltrate is not excluded. Clinical correlation is recommended. The right lung is clear. There is no pneumothorax. The cardiac silhouette is within normal limits. No acute osseous pathology. IMPRESSION: Probable trace left pleural effusion and left lung base atelectasis. Developing infiltrate is not excluded. Clinical correlation recommended. Electronically Signed   By: Anner Crete M.D.   On: 06/05/2019 00:17    Procedures Procedures (including critical care time)  Medications Ordered in ED Medications  potassium chloride 10 mEq in 100 mL IVPB (10 mEq Intravenous New Bag/Given 06/05/19 0227)  sodium chloride (PF) 0.9 % injection (has no administration in time range)  vancomycin (VANCOCIN) 2,000 mg in sodium chloride 0.9 % 500 mL IVPB (has no administration in time range)  hydrALAZINE (APRESOLINE) injection 5 mg (has no administration in time range)  sodium chloride 0.9 % bolus 30 mL/kg (has no administration in time range)  iohexol (OMNIPAQUE) 350 MG/ML injection 80 mL (80 mLs Intravenous Contrast Given 06/05/19 0059)  ceFEPIme (MAXIPIME) 2 g in sodium chloride 0.9 % 100 mL IVPB (2 g Intravenous New Bag/Given 06/05/19 0121)  metroNIDAZOLE (FLAGYL) IVPB 500 mg (500 mg Intravenous New Bag/Given 06/05/19 0120)  potassium chloride SA (KLOR-CON) CR tablet 60 mEq (60 mEq Oral Given 06/05/19 0123)  albuterol (VENTOLIN HFA) 108 (90 Base) MCG/ACT inhaler 2 puff (2 puffs Inhalation Given 06/05/19 0130)   MDM Reviewed: previous chart, nursing note and vitals Interpretation: labs, ECG, x-ray and CT scan (hypokalemia hypomag elevated white count  NOCHF by me on CXR) Total time providing critical care:  30-74 minutes. This excludes time spent performing separately reportable procedures  and services. Consults: admitting MD  CRITICAL CARE Performed by: Skyeler Smola K Nitara Szczerba-Rasch Total critical care time: 60 minutes Critical care time was exclusive of separately billable procedures and treating other patients. Critical care was necessary to treat or prevent imminent or life-threatening deterioration. Critical care was time spent personally by me on the following activities: development of treatment plan with patient and/or surrogate as well as nursing, discussions with consultants, evaluation of patient's response to treatment, examination of patient, obtaining history from patient or surrogate, ordering and performing treatments and interventions, ordering and review of laboratory studies, ordering and review of radiographic studies, pulse oximetry and re-evaluation of patient's condition.  Initial Impression / Assessment and Plan / ED Course  Patient is septic.  Urinary vs skin source.  Patient is not hypotensive, but rather consistently very hypertensive in the ED.  We have treated this.  Elevated troponin is likely demand ischemia.  No PE no PNA on CT.  No phlegmon or pelvic infection.  Covered with broad spectrum antibiotics.  Magnesium and potassium repleted in the ED.  Will need recheck.    Final Clinical Impressions(s) / ED Diagnoses   Final diagnoses:  Sepsis, due to unspecified organism, unspecified whether acute organ dysfunction present ([place])  Elevated troponin  Hypokalemia  Hypomagnesemia   Admit to medicine will need stepdown care.  Covid is negative.     Aliya Sol, MD 06/05/19 [phone]

## 2019-06-05 NOTE — Progress Notes (Signed)
  Echocardiogram 2D Echocardiogram has been performed.  Bobbye Charleston 06/05/2019, 11:19 AM

## 2019-06-05 NOTE — ED Notes (Signed)
Date and time results received: 06/05/19 0014 (use smartphrase ".now" to insert current time)  Test: Lactic Acid Critical Value: 3.0  Name of Provider Notified: Palumbo MD  Orders Received? Or Actions Taken?: waiting on orders

## 2019-06-05 NOTE — H&P (Addendum)
History and Physical    [name] [id number] DOB: May 30, 1956 DOA: 06/04/2019  PCP: Biagio Borg, MD , Dr. Loanne Drilling -endocrine, Dr. Johney Maine - GS, Dr. Karsten Ro- urology  Patient coming from: patient is coming from home  I have personally briefly reviewed patient's old medical records in [place]  Chief Complaint: somnolence, fevers with rigors, shortness of breath  HPI: [name] is a 63 y.o. male with medical history significant of DM, HTN, chronically draining pilonidal cyst and h/o bladder cancer. He underwent surveillance cystoscopy Thursday, Jun 02, 2019 by Dr. Karsten Ro with no finding of recurrent cancer. Starting Friday, 10/23 he started to feel sick with fevers, rigors, somnolence, decreased appetite. His symptoms worsened and included SOB. Thus, he presents to [facility] for evaluation. For several months he has had increased mucopurulent drainage from his pilonidal cyst fistual at the base of the scrotum which has not changed acutely. He also has had a grossly swollen penis for 2+ years that has not changed.  ED Course: Patient was febrile, appeared ill. Lab revealed a leukocytosis to 21k. He was hypokalemic and hypomagnesemic. CT chest and abd/pelvis negative for any acute findings. He was hypertensive and required doses of hydralazine to control his BP. Code sepsis was initiated and he received Vanc, Cefipime and Flagyl. [facility] was called to admit the patient for continued treatment  Review of Systems: As per HPI otherwise 10 point review of systems negative.    Past Medical History:  Diagnosis Date  . Allergic rhinitis   . At risk for sleep apnea    STOP-BANG= 5   SENT TO PCP 03-14-2014  . Cataract    surgically removed bilateral  . Condyloma acuminatum of penis   . Diabetic neuropathy ([place])   . GERD (gastroesophageal reflux disease)   . History of bladder cancer    s/p  turbt  2013/   transitional cell carcinoma--   . History of condyloma acuminatum    PERINEAL  AREA  W/ RECURRENCY  . History of gout   . Hyperlipidemia   . Hypertension   . Lower urinary tract symptoms (LUTS)   . Productive cough   . PVD (peripheral vascular disease) with claudication (HCC)    bilateral SFA disease-- right > left  and left tibial artery disease--  per duplex  . Smokers' cough ([place])   . Type 2 diabetes mellitus with insulin therapy ([place])    monitor by  dr ellsion  . Wears dentures    upper    Past Surgical History:  Procedure Laterality Date  . AXILLARY HIDRADENITIS EXCISION  1997  . CARDIOVASCULAR STRESS TEST  07-24-2014  dr Kathlyn [place]   Low risk lexiscan nuclear study with apical thinning and small inferolateral wall infarct at mid & basal level , no ischemia/  normal LVF and wall motion , ef 59%  . CATARACT EXTRACTION Left   . CATARACT EXTRACTION W/ INTRAOCULAR LENS IMPLANT Right   . CO2 LASER APPLICATION N/A [id number]   Procedure: CO2 LASER APPLICATION,PENIS, GROIN, ANUS;  Surgeon: [name], MD;  Location: [facility];  Service: General;  Laterality: N/A;  . CO2 LASER APPLICATION N/A [id number]   Procedure: CO2 LASER APPLICATION;  Surgeon: Kathie Rhodes, MD;  Location: [facility];  Service: Urology;  Laterality: N/A;  . CONDYLOMA EXCISION/FULGURATION N/A 05/21/2015   Procedure: CONDYLOMA REMOVAL;  Surgeon: Kathie Rhodes, MD;  Location: [facility];  Service: Urology;  Laterality: N/A;  . HEMORRHOID  SURGERY  10/24/2014   Procedure: HEMORRHOIDECTOMY;  Surgeon: Michael Boston, MD;  Location: [facility];  Service: General;;  . INCISION AND DRAINAGE ABSCESS Left 10/24/2014   Procedure: INCISION AND DRAINAGE ABSCESS;  Surgeon: Michael Boston, MD;  Location: [facility];  Service: General;  Laterality: Left;  . INGUINAL HIDRADENITIS EXCISION  1998, 1999  . LASER ABLATION CONDOLAMATA N/A 03/20/2014   Procedure: EXAM UNDER ANESTHESIA, REMOVAL/ABLATION OF CONDYLOMATA PENIS,GROINS, ANUS,  ANAL CANAL;  Surgeon: [name], MD;  Location: [place];  Service: General;  Laterality: N/A;  groin and anus  . LASER ABLATION CONDOLAMATA N/A 10/24/2014   Procedure: LASER ABLATION CONDOLAMATA;  Surgeon: Michael Boston, MD;  Location: [facility];  Service: General;  Laterality: N/A;  . LASER ABLATION OF PENILE AND PERIANAL WARTS  07-29-2007  Dr. Johney Maine  . LEFT SHOULDER SURGERY  2003  . MASS EXCISION N/A 10/24/2014   Procedure: EXCISION OF PERINEAL MASS/SINUS;  Surgeon: Michael Boston, MD;  Location: [facility];  Service: General;  Laterality: N/A;  . MOHS SURGERY     back  . MOHS SURGERY  2017   face  . MULTIPLE TOOTH EXTRACTIONS    . PERINEAL HIDRADENITIS EXCISION  1998, 1999  . 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 Married x 35 years, no children, 5 step-children and many grandchildren in [place]. Lives with his wife.   reports that he quit smoking about 3 years ago. His smoking use included cigars and cigarettes. He has a 41.00 pack-year smoking history. He has never used smokeless tobacco. He reports that he does not drink alcohol or use drugs.  No Known Allergies  Family History  Problem Relation Age of Onset  . Hypertension Mother   . Cancer Father        lung ca  . Diabetes Maternal Aunt        x 2  . Colon cancer Neg Hx   . Esophageal cancer Neg Hx   . Pancreatic cancer Neg Hx   . Prostate cancer Neg Hx   . Kidney disease Neg Hx   . Liver disease Neg Hx   . Lung cancer Neg Hx   . Rectal cancer Neg Hx   . Stomach cancer Neg Hx    Unacceptable: Noncontributory, unremarkable, or  negative. Acceptable: Family history reviewed and not pertinent (If you reviewed it)  Prior to Admission medications   Medication Sig Start Date End Date Taking? Authorizing Provider  acetaminophen (TYLENOL) 500 MG tablet Take 500 mg by mouth every 6 (six) hours as needed.   Yes [provider]  amLODipine (NORVASC) 2.5 MG tablet Take 1 tablet (2.5 mg total) by mouth daily. 04/21/19  Yes Biagio Borg, MD  aspirin EC 81 MG tablet Take 1 tablet (81 mg total) by mouth daily. 11/25/16  Yes Wellington Hampshire, MD  atorvastatin (LIPITOR) 20 MG tablet Take 1 tablet (20 mg total) by mouth daily. 04/21/19  Yes Biagio Borg, MD  hydrochlorothiazide (HYDRODIURIL) 25 MG tablet Take 1 tablet (25 mg total) by mouth daily. 04/21/19  Yes Biagio Borg, MD  Insulin Glargine (LANTUS SOLOSTAR)  100 UNIT/ML Solostar Pen Inject 250 Units into the skin every morning. Patient taking differently: Inject 240 Units into the skin every morning.  09/27/18  Yes [name], MD  loratadine (CLARITIN) 10 MG tablet Take 10 mg by mouth daily.   Yes [provider]  losartan (COZAAR) 100 MG tablet Take 1 tablet (100 mg total) by mouth daily. 04/21/19  Yes Biagio Borg, MD  metFORMIN (GLUCOPHAGE-XR) 500 MG 24 hr tablet Take 1 tablet by mouth once daily with breakfast Patient taking differently: Take 500 mg by mouth daily with breakfast.  04/21/19  Yes Biagio Borg, MD  pantoprazole (PROTONIX) 40 MG tablet Take 1 tablet (40 mg total) by mouth daily. 04/21/19  Yes Biagio Borg, MD  potassium chloride 20 MEQ TBCR Take 20 mEq by mouth daily. 04/06/18  Yes Wellington Hampshire, MD  Vitamin D, Ergocalciferol, (DRISDOL) 1.25 MG (50000 UT) CAPS capsule Take 1 capsule (50,000 Units total) by mouth every 7 (seven) days. Patient taking differently: Take 50,000 Units by mouth every Friday.  04/21/19  Yes Biagio Borg, MD    Physical Exam: Vitals:   06/05/19 0245 06/05/19 0316 06/05/19 0336 06/05/19 0345  BP: (!) 232/215 (!)  175/78  (!) 168/90  Pulse: (!) 111 (!) 108  (!) 109  Resp: (!) 27 (!) 26  (!) 30  Temp:   99.5 F (37.5 C)   TempSrc:   Oral   SpO2: 94% 96%  97%  Weight:      Height:        Constitutional: NAD, calm, comfortable Vitals:   06/05/19 0245 06/05/19 0316 06/05/19 0336 06/05/19 0345  BP: (!) 232/215 (!) 175/78  (!) 168/90  Pulse: (!) 111 (!) 108  (!) 109  Resp: (!) 27 (!) 26  (!) 30  Temp:   99.5 F (37.5 C)   TempSrc:   Oral   SpO2: 94% 96%  97%  Weight:      Height:       General appearance - ill appearing man but in no acute distress Eyes: PERRL, lids and conjunctivae normal ENMT: Mucous membranes are moist. Posterior pharynx clear of any exudate or lesions.Full upper denture, native dentition mandible.  Neck: normal, supple, no masses, no thyromegaly Respiratory: clear to auscultation bilaterally, no wheezing, no crackles. Normal respiratory effort. No accessory muscle use.  Cardiovascular: Regular rate and rhythm, no murmurs / rubs / gallops. No extremity edema. 1+ pedal pulses. No carotid bruits.  Abdomen: obese with protruberant abd, no tenderness, no masses palpated. No hepatosplenomegaly-exam hindered by  Girth.. Bowel sounds positive but hypoactive GU - very swollen penis (chronic) with erythema of the scrotum. .  Musculoskeletal: no clubbing / cyanosis. No joint deformity upper and lower extremities. Good ROM, no contractures. Normal muscle tone.  Skin: no rashes, ulcers. No induration. 2 cm pilonidal fistula at the base of the scrotum - left. Plastic surgical changes medial canthus right eye. Neurologic: CN 2-12 grossly intact. Sensation intact, Strength 5/5 in all 4.  Psychiatric: Normal judgment and insight. Alert and oriented x 3. Normal mood.     Labs on Admission: I have personally reviewed following labs and imaging studies  CBC: Recent Labs  Lab 06/04/19 2318 06/04/19 2336  WBC 21.0*  --   NEUTROABS 18.0*  --   HGB 14.1 13.3  HCT 40.7 39.0  MCV 91.7   --   PLT 218  --    Basic Metabolic Panel: Recent Labs  Lab 06/04/19 2318  06/04/19 2336 06/04/19 2348  NA 136 137  --   K 2.5* 2.5* 2.8*  CL 99 98  --   CO2 24  --   --   GLUCOSE 104* 96  --   BUN 20 19  --   CREATININE 1.61* 1.60*  --   CALCIUM 8.1*  --   --   MG  --   --  1.1*   GFR: Estimated Creatinine Clearance: 59.7 mL/min (A) (by C-G formula based on SCr of 1.6 mg/dL (H)). Liver Function Tests: Recent Labs  Lab 06/04/19 2318  AST 48*  ALT 28  ALKPHOS 64  BILITOT 1.5*  PROT 7.3  ALBUMIN 3.4*   No results for input(s): LIPASE, AMYLASE in the last 168 hours. No results for input(s): AMMONIA in the last 168 hours. Coagulation Profile: Recent Labs  Lab 06/04/19 2318  INR 1.1   Cardiac Enzymes: No results for input(s): CKTOTAL, CKMB, CKMBINDEX, TROPONINI in the last 168 hours. BNP (last 3 results) No results for input(s): PROBNP in the last 8760 hours. HbA1C: No results for input(s): HGBA1C in the last 72 hours. CBG: No results for input(s): GLUCAP in the last 168 hours. Lipid Profile: No results for input(s): CHOL, HDL, LDLCALC, TRIG, CHOLHDL, LDLDIRECT in the last 72 hours. Thyroid Function Tests: No results for input(s): TSH, T4TOTAL, FREET4, T3FREE, THYROIDAB in the last 72 hours. Anemia Panel: No results for input(s): VITAMINB12, FOLATE, FERRITIN, TIBC, IRON, RETICCTPCT in the last 72 hours. Urine analysis:    Component Value Date/Time   COLORURINE YELLOW 06/04/2019 2323   APPEARANCEUR CLEAR 06/04/2019 2323   LABSPEC 1.008 06/04/2019 2323   PHURINE 6.0 06/04/2019 2323   GLUCOSEU NEGATIVE 06/04/2019 2323   GLUCOSEU 250 (A) 04/21/2019 1041   HGBUR LARGE (A) 06/04/2019 2323   BILIRUBINUR NEGATIVE 06/04/2019 2323   KETONESUR NEGATIVE 06/04/2019 2323   PROTEINUR 100 (A) 06/04/2019 2323   UROBILINOGEN 1.0 04/21/2019 1041   NITRITE NEGATIVE 06/04/2019 2323   LEUKOCYTESUR MODERATE (A) 06/04/2019 2323    Radiological Exams on Admission: Ct Angio  Chest Pe W And/or Wo Contrast  Result Date: 06/05/2019 CLINICAL DATA:  63 year old male with increased shortness of breath and fever. EXAM: CT ANGIOGRAPHY CHEST CT ABDOMEN AND PELVIS WITH CONTRAST TECHNIQUE: Multidetector CT imaging of the chest was performed using the standard protocol during bolus administration of intravenous contrast. Multiplanar CT image reconstructions and MIPs were obtained to evaluate the vascular anatomy. Multidetector CT imaging of the abdomen and pelvis was performed using the standard protocol during bolus administration of intravenous contrast. CONTRAST:  96mL OMNIPAQUE IOHEXOL 350 MG/ML SOLN COMPARISON:  CT of the abdomen pelvis dated 05/01/2017 and chest radiograph dated 06/05/2019 FINDINGS: CTA CHEST FINDINGS Cardiovascular: There is no cardiomegaly or pericardial effusion. Coronary vascular calcification of the LAD. Mild atherosclerotic calcification of the thoracic aorta. No aneurysmal dilatation or dissection. Evaluation of the pulmonary arteries is very limited due to suboptimal opacification and timing of the contrast as well as respiratory motion artifact. No definite large central pulmonary artery embolus identified. Mediastinum/Nodes: There is no hilar or mediastinal adenopathy. The esophagus is grossly unremarkable. No mediastinal fluid collection. Lungs/Pleura: Minimal left lung base linear atelectasis/scarring. No focal consolidation, pleural effusion, or pneumothorax. The central airways are patent. Musculoskeletal: No acute osseous pathology. Review of the MIP images confirms the above findings. CT ABDOMEN and PELVIS FINDINGS No intra-abdominal free air or free fluid. Hepatobiliary: Diffuse fatty infiltration of the liver. No intrahepatic biliary ductal dilatation. There is sludge or  noncalcified stone in the gallbladder. No pericholecystic fluid or evidence of acute cholecystitis by CT. Pancreas: Unremarkable. No pancreatic ductal dilatation or surrounding  inflammatory changes. Spleen: Normal in size without focal abnormality. Adrenals/Urinary Tract: The adrenal glands are unremarkable. There is no hydronephrosis on either side. There is symmetric enhancement and excretion of contrast by both kidneys. The visualized ureters and urinary bladder appear unremarkable. Minimal bilateral perinephric stranding, nonspecific. Correlation with urinalysis recommended to exclude UTI. Stomach/Bowel: There is sigmoid diverticulosis without active inflammatory changes. There is no bowel obstruction or active inflammation. The appendix is normal. Vascular/Lymphatic: Advanced aortoiliac atherosclerotic disease. The IVC is unremarkable. No portal venous gas. There is no adenopathy. Reproductive: Enlarged prostate gland measuring 6 cm in transverse axial diameter. The seminal vesicles are symmetric. Other: None Musculoskeletal: Degenerative changes of the spine. No acute osseous pathology. Review of the MIP images confirms the above findings. IMPRESSION: 1. No acute intrathoracic, abdominal, or pelvic pathology. No CT evidence of central pulmonary artery embolus. 2. Fatty liver. 3. Sigmoid diverticulosis. No bowel obstruction or active inflammation. Normal appendix. 4. Gallbladder sludge or noncalcified stones. Aortic Atherosclerosis (ICD10-I70.0). Electronically Signed   By: Anner Crete M.D.   On: 06/05/2019 01:54   Ct Abdomen Pelvis W Contrast  Result Date: 06/05/2019 CLINICAL DATA:  63 year old male with increased shortness of breath and fever. EXAM: CT ANGIOGRAPHY CHEST CT ABDOMEN AND PELVIS WITH CONTRAST TECHNIQUE: Multidetector CT imaging of the chest was performed using the standard protocol during bolus administration of intravenous contrast. Multiplanar CT image reconstructions and MIPs were obtained to evaluate the vascular anatomy. Multidetector CT imaging of the abdomen and pelvis was performed using the standard protocol during bolus administration of intravenous  contrast. CONTRAST:  60mL OMNIPAQUE IOHEXOL 350 MG/ML SOLN COMPARISON:  CT of the abdomen pelvis dated 05/01/2017 and chest radiograph dated 06/05/2019 FINDINGS: CTA CHEST FINDINGS Cardiovascular: There is no cardiomegaly or pericardial effusion. Coronary vascular calcification of the LAD. Mild atherosclerotic calcification of the thoracic aorta. No aneurysmal dilatation or dissection. Evaluation of the pulmonary arteries is very limited due to suboptimal opacification and timing of the contrast as well as respiratory motion artifact. No definite large central pulmonary artery embolus identified. Mediastinum/Nodes: There is no hilar or mediastinal adenopathy. The esophagus is grossly unremarkable. No mediastinal fluid collection. Lungs/Pleura: Minimal left lung base linear atelectasis/scarring. No focal consolidation, pleural effusion, or pneumothorax. The central airways are patent. Musculoskeletal: No acute osseous pathology. Review of the MIP images confirms the above findings. CT ABDOMEN and PELVIS FINDINGS No intra-abdominal free air or free fluid. Hepatobiliary: Diffuse fatty infiltration of the liver. No intrahepatic biliary ductal dilatation. There is sludge or noncalcified stone in the gallbladder. No pericholecystic fluid or evidence of acute cholecystitis by CT. Pancreas: Unremarkable. No pancreatic ductal dilatation or surrounding inflammatory changes. Spleen: Normal in size without focal abnormality. Adrenals/Urinary Tract: The adrenal glands are unremarkable. There is no hydronephrosis on either side. There is symmetric enhancement and excretion of contrast by both kidneys. The visualized ureters and urinary bladder appear unremarkable. Minimal bilateral perinephric stranding, nonspecific. Correlation with urinalysis recommended to exclude UTI. Stomach/Bowel: There is sigmoid diverticulosis without active inflammatory changes. There is no bowel obstruction or active inflammation. The appendix is  normal. Vascular/Lymphatic: Advanced aortoiliac atherosclerotic disease. The IVC is unremarkable. No portal venous gas. There is no adenopathy. Reproductive: Enlarged prostate gland measuring 6 cm in transverse axial diameter. The seminal vesicles are symmetric. Other: None Musculoskeletal: Degenerative changes of the spine. No acute osseous pathology. Review of  the MIP images confirms the above findings. IMPRESSION: 1. No acute intrathoracic, abdominal, or pelvic pathology. No CT evidence of central pulmonary artery embolus. 2. Fatty liver. 3. Sigmoid diverticulosis. No bowel obstruction or active inflammation. Normal appendix. 4. Gallbladder sludge or noncalcified stones. Aortic Atherosclerosis (ICD10-I70.0). Electronically Signed   By: Anner Crete M.D.   On: 06/05/2019 01:54   Dg Chest Portable 1 View  Result Date: 06/05/2019 CLINICAL DATA:  63 year old male with shortness of breath. EXAM: PORTABLE CHEST 1 VIEW COMPARISON:  Chest radiograph dated 10/02/2014 FINDINGS: There is blunting of the left costophrenic angle which may represent trace left pleural effusion. Left lung base densities, likely atelectatic changes. Developing infiltrate is not excluded. Clinical correlation is recommended. The right lung is clear. There is no pneumothorax. The cardiac silhouette is within normal limits. No acute osseous pathology. IMPRESSION: Probable trace left pleural effusion and left lung base atelectasis. Developing infiltrate is not excluded. Clinical correlation recommended. Electronically Signed   By: Anner Crete M.D.   On: 06/05/2019 00:17    EKG: Independently reviewed. Sinus tach, bigeminy, old anterior infarct.  Assessment/Plan Active Problems:   Essential hypertension   Asthma   Diabetes ([place])   Sepsis secondary to UTI [facility])  (please populate well all problems here in Problem List. (For example, if patient is on BP meds at home and you resume or decide to hold them, it is a problem that  needs to be her. Same for CAD, COPD, HLD and so on)   1. Sepsis 2/2 UTI - patient with recent instrumentation with onset of fever, rigors and associated symptoms within 24 hrs. Leukocytosis noted. Positive U/A with > 50 WBC/hpf. Perinephric stranding on CT scan c/w upper tract UTI. Received Vanc/Cef/Flagyl in ED Plan Stepdown adm 2/2 severity of infection  Zosyn 4.5 g q6 - severe infection in DM patient  IV hydration  APAP for fever.  2. Urol - chronically swollen penis, now with difficulty with urination. Plan Foley cath - explained increased risk of persistent infection to patient. Benefit> risk  3. DM - last A1C Sept 10, 2o020 8.7%. He is followed by endocrinology Plan Continue home basal insulin dose  Hold metformin  Glycemic control orders - sliding scale.  4. HTN - continue home regimen, including ACE-I  5. Disordered electrolytes - hypokalemia, low mag Plan Patient to complete IV K runs ordered by EDP  Continue K-dur  Recheck Bmet 06/06/19 AM  6. Pilonidal cyst - chronic problem, unlikely to be source of infection Plan  Dry dressing    Addendum: hsTroponin #1 160, #2 509. Patient denied any chest pain,pressure or discomfort. He is not followed for any cardiac problems. Last Echo 03/29/18 with pseudonormal LVEF with poor relaxation, grade II diastolic dysfxn, but no wall motion abnl. Last cardiac stress test 2015 negative. Suspect elevated troponin related to stress ischemia from sepsis. Discussed with patient and spouse.   DVT prophylaxis: lovenox (Lovenox/Heparin/SCD's/anticoagulated/None (if comfort care) Code Status: full code (Full/Partial (specify details) Family Communication: wife was present. All questions answered (Specify name, relationship. Do not write "discussed with patient". Specify tel # if discussed over the phone) Disposition Plan: Home - 3-4 days (specify when and where you expect patient to be discharged) Consults called: none (with names) Admission  status: stepdown (inpatient / obs / tele / medical floor / SDU)   Adella Hare MD [facility] Pager [phone]  If 7PM-7AM, please contact night-coverage [email or web address] Password [email or web address]  06/05/2019, 3:50 AM

## 2019-06-05 NOTE — ED Notes (Signed)
Dr.Norins at bedside at this time.

## 2019-06-05 NOTE — Consult Note (Addendum)
Cardiology Consultation:   Patient ID: [name] MRN: [id number]; DOB: 11/15/1955  Admit date: 06/04/2019 Date of Consult: 06/05/2019  Primary Care Provider: Biagio Borg, MD Primary Cardiologist: No primary care provider on file.  Primary Electrophysiologist:  None    Patient Profile:   [name] is a 63 y.o. male with a hx of PAD who is being seen today for the evaluation of  at the request of Dr. Adella Hare for elevated troponin and shortness of breath.  History of Present Illness:   [name] is a 63 year old male with a past medical history which includes peripheral arterial disease, insulin-dependent diabetes mellitus, hyperlipidemia, hypertension, and a prior history of tobacco use.  He follows with Dr. Fletcher Anon who last did a telehealth visit with him on 11/30/2018.  He has an occluded right SFA.  He reportedly has a history of atypical chest pain.  He underwent a nuclear stress test in 2015 which showed a small inferolateral infarct with no significant ischemia.  He has been managed medically since that time.  He presented to the ED early this morning.  He has a history of a chronically draining pilonidal cyst and history of bladder cancer.  He underwent surveillance cystoscopy on 06/02/2019 which found no cancer recurrence.  On 06/03/2019 he began to feel sick and had fevers, rigors, fatigue, and diminished appetite.  He also developed shortness of breath.  Upon presentation the ED he was found to be febrile with leukocytosis, white blood cells 21,000.  He was also hypokalemic and hypomagnesemic.  He was markedly hypertensive and given hydralazine.  Code sepsis was initiated and received vancomycin, cefepime, and Flagyl.  Recorded blood pressures today are 232/215, 175/78, and 168/90 earlier this morning. Most recent blood pressures 156/95.  High-sensitivity troponins were 162 yesterday and 509 at 318 this morning.  BNP 112 yesterday.  Potassium initially 2.5 with a  creatinine of 1.61.  Venous lactate elevated at 3.  Urinalysis showed moderate leukocytes and greater than 50 white blood cells.  He is negative for Covid.  Chest x-ray showed probable trace left pleural effusion and left lung base atelectasis.  Developing infiltrate cannot entirely be excluded.  CT angiography of the chest showed no acute intrathoracic, abdominal, or pelvic pathology.  Fatty liver was noted.  I personally reviewed the initial ECG performed at 2318 on 06/04/2019 which showed sinus tachycardia with frequent PACs and nonspecific ST segment abnormalities and late R wave transition.  I personally reviewed today's ECG which shows sinus rhythm, 95 bpm, with frequent PACs.  He had some occasional right-sided chest pains but this has since resolved.  His shortness of breath began Friday night accompanied by a headache and profound weakness.  On Saturday night he was unable to stand up due to his weakness.  At the present time his shortness of breath has resolved.  He currently denies any chest pain.   Heart Pathway Score:     Past Medical History:  Diagnosis Date  . Allergic rhinitis   . At risk for sleep apnea    STOP-BANG= 5   SENT TO PCP 03-14-2014  . Cataract    surgically removed bilateral  . Condyloma acuminatum of penis   . Diabetic neuropathy ([place])   . GERD (gastroesophageal reflux disease)   . History of bladder cancer    s/p  turbt  2013/   transitional cell carcinoma--   . History of condyloma acuminatum    PERINEAL AREA  W/ RECURRENCY  .  History of gout   . Hyperlipidemia   . Hypertension   . Lower urinary tract symptoms (LUTS)   . Productive cough   . PVD (peripheral vascular disease) with claudication (HCC)    bilateral SFA disease-- right > left  and left tibial artery disease--  per duplex  . Smokers' cough ([place])   . Type 2 diabetes mellitus with insulin therapy ([place])    monitor by  dr ellsion  . Wears dentures    upper    Past Surgical  History:  Procedure Laterality Date  . AXILLARY HIDRADENITIS EXCISION  1997  . CARDIOVASCULAR STRESS TEST  07-24-2014  dr Kathlyn [place]   Low risk lexiscan nuclear study with apical thinning and small inferolateral wall infarct at mid & basal level , no ischemia/  normal LVF and wall motion , ef 59%  . CATARACT EXTRACTION Left   . CATARACT EXTRACTION W/ INTRAOCULAR LENS IMPLANT Right   . CO2 LASER APPLICATION N/A [id number]   Procedure: CO2 LASER APPLICATION,PENIS, GROIN, ANUS;  Surgeon: [name], MD;  Location: [place];  Service: General;  Laterality: N/A;  . CO2 LASER APPLICATION N/A [id number]   Procedure: CO2 LASER APPLICATION;  Surgeon: Kathie Rhodes, MD;  Location: [facility];  Service: Urology;  Laterality: N/A;  . CONDYLOMA EXCISION/FULGURATION N/A 05/21/2015   Procedure: CONDYLOMA REMOVAL;  Surgeon: Kathie Rhodes, MD;  Location: [facility];  Service: Urology;  Laterality: N/A;  . HEMORRHOID SURGERY  10/24/2014   Procedure: HEMORRHOIDECTOMY;  Surgeon: Michael Boston, MD;  Location: [facility];  Service: General;;  . INCISION AND DRAINAGE ABSCESS Left 10/24/2014   Procedure: INCISION AND DRAINAGE ABSCESS;  Surgeon: Michael Boston, MD;  Location: [place];  Service: General;  Laterality: Left;  . INGUINAL HIDRADENITIS EXCISION  1998, 1999  . LASER ABLATION CONDOLAMATA N/A 03/20/2014   Procedure: EXAM UNDER ANESTHESIA, REMOVAL/ABLATION OF CONDYLOMATA PENIS,GROINS, ANUS, ANAL CANAL;  Surgeon: [name], MD;  Location: [place];  Service: General;  Laterality: N/A;  groin and anus  . LASER ABLATION CONDOLAMATA N/A 10/24/2014   Procedure: LASER ABLATION CONDOLAMATA;  Surgeon: Michael Boston, MD;  Location: [facility];  Service: General;  Laterality: N/A;  . LASER ABLATION OF PENILE AND PERIANAL WARTS  07-29-2007  Dr. Johney Maine  . LEFT SHOULDER SURGERY  2003  . MASS EXCISION  N/A 10/24/2014   Procedure: EXCISION OF PERINEAL MASS/SINUS;  Surgeon: Michael Boston, MD;  Location: [place];  Service: General;  Laterality: N/A;  . MOHS SURGERY     back  . MOHS SURGERY  2017   face  . MULTIPLE TOOTH EXTRACTIONS    . PERINEAL HIDRADENITIS EXCISION  1998, 1999  . TRANSURETHRAL RESECTION OF BLADDER TUMOR  05/21/2012   Procedure: TRANSURETHRAL RESECTION OF BLADDER TUMOR (TURBT);  Surgeon: Claybon Jabs, MD;  Location: [facility];  Service: Urology;  Laterality: N/A;        Home Medications:  Prior to Admission medications   Medication Sig Start Date End Date Taking? Authorizing Provider  acetaminophen (TYLENOL) 500 MG tablet Take 500 mg by mouth every 6 (six) hours as needed.   Yes [provider]  amLODipine (NORVASC) 2.5 MG tablet Take 1 tablet (2.5 mg total) by mouth daily. 04/21/19  Yes Biagio Borg, MD  aspirin EC 81 MG tablet Take 1 tablet (81 mg total) by mouth daily. 11/25/16  Yes Wellington Hampshire,  MD  atorvastatin (LIPITOR) 20 MG tablet Take 1 tablet (20 mg total) by mouth daily. 04/21/19  Yes Biagio Borg, MD  hydrochlorothiazide (HYDRODIURIL) 25 MG tablet Take 1 tablet (25 mg total) by mouth daily. 04/21/19  Yes Biagio Borg, MD  Insulin Glargine (LANTUS SOLOSTAR) 100 UNIT/ML Solostar Pen Inject 250 Units into the skin every morning. Patient taking differently: Inject 240 Units into the skin every morning.  09/27/18  Yes [name], MD  loratadine (CLARITIN) 10 MG tablet Take 10 mg by mouth daily.   Yes [provider]  losartan (COZAAR) 100 MG tablet Take 1 tablet (100 mg total) by mouth daily. 04/21/19  Yes Biagio Borg, MD  metFORMIN (GLUCOPHAGE-XR) 500 MG 24 hr tablet Take 1 tablet by mouth once daily with breakfast Patient taking differently: Take 500 mg by mouth daily with breakfast.  04/21/19  Yes Biagio Borg, MD  pantoprazole (PROTONIX) 40 MG tablet Take 1 tablet (40 mg total) by mouth daily. 04/21/19   Yes Biagio Borg, MD  potassium chloride 20 MEQ TBCR Take 20 mEq by mouth daily. 04/06/18  Yes Wellington Hampshire, MD  Vitamin D, Ergocalciferol, (DRISDOL) 1.25 MG (50000 UT) CAPS capsule Take 1 capsule (50,000 Units total) by mouth every 7 (seven) days. Patient taking differently: Take 50,000 Units by mouth every Friday.  04/21/19  Yes Biagio Borg, MD    Inpatient Medications: Scheduled Meds: . amLODipine  2.5 mg Oral Daily  . aspirin EC  81 mg Oral Daily  . atorvastatin  20 mg Oral Daily  . enoxaparin (LOVENOX) injection  40 mg Subcutaneous Q24H  . hydrochlorothiazide  25 mg Oral Daily  . insulin aspart  0-20 Units Subcutaneous TID WC  . insulin glargine  80 Units Subcutaneous Daily   And  . insulin glargine  80 Units Subcutaneous Daily   And  . insulin glargine  80 Units Subcutaneous Daily  . loratadine  10 mg Oral Daily  . losartan  100 mg Oral Daily  . pantoprazole  40 mg Oral Daily  . potassium chloride SA  20 mEq Oral Daily  . senna  1 tablet Oral BID   Continuous Infusions: . piperacillin-tazobactam (ZOSYN)  IV 3.375 g (06/05/19 0616)   PRN Meds: acetaminophen  Allergies:   No Known Allergies  Social History:   Social History   Socioeconomic History  . Marital status: Married    Spouse name: Not on file  . Number of children: Not on file  . Years of education: Not on file  . Highest education level: Not on file  Occupational History  . Occupation: Disabled  Social Needs  . Financial resource strain: Not on file  . Food insecurity    Worry: Not on file    Inability: Not on file  . Transportation needs    Medical: Not on file    Non-medical: Not on file  Tobacco Use  . Smoking status: Former Smoker    Packs/day: 1.00    Years: 41.00    Pack years: 41.00    Types: Cigars, Cigarettes    Quit date: 04/11/2016    Years since quitting: 3.1  . Smokeless tobacco: Never Used  Substance and Sexual Activity  . Alcohol use: No    Alcohol/week: 0.0 standard  drinks  . Drug use: No  . Sexual activity: Not on file  Lifestyle  . Physical activity    Days per week: Not on file    Minutes per  session: Not on file  . Stress: Not on file  Relationships  . Social Herbalist on phone: Not on file    Gets together: Not on file    Attends religious service: Not on file    Active member of club or organization: Not on file    Attends meetings of clubs or organizations: Not on file    Relationship status: Not on file  . Intimate partner violence    Fear of current or ex partner: Not on file    Emotionally abused: Not on file    Physically abused: Not on file    Forced sexual activity: Not on file  Other Topics Concern  . Not on file  Social History Narrative  . Not on file    Family History:    Family History  Problem Relation Age of Onset  . Hypertension Mother   . Cancer Father        lung ca  . Diabetes Maternal Aunt        x 2  . Colon cancer Neg Hx   . Esophageal cancer Neg Hx   . Pancreatic cancer Neg Hx   . Prostate cancer Neg Hx   . Kidney disease Neg Hx   . Liver disease Neg Hx   . Lung cancer Neg Hx   . Rectal cancer Neg Hx   . Stomach cancer Neg Hx      ROS:  Please see the history of present illness.   All other ROS reviewed and negative.     Physical Exam/Data:   Vitals:   06/05/19 0645 06/05/19 0700 06/05/19 0730 06/05/19 0830  BP:  (!) 126/54 (!) 131/59 (!) 156/95  Pulse: (!) 103 92 70 100  Resp: (!) 31 (!) 26 (!) 26 (!) 33  Temp:    99.2 F (37.3 C)  TempSrc:      SpO2: 96% 96% 96% 95%  Weight:      Height:        Intake/Output Summary (Last 24 hours) at 06/05/2019 0951 Last data filed at 06/05/2019 0505 Gross per 24 hour  Intake 1799.54 ml  Output 700 ml  Net 1099.54 ml   Last 3 Weights 06/05/2019 04/21/2019 11/30/2018  Weight (lbs) 260 lb 258 lb 260 lb  Weight (kg) 117.935 kg 117.028 kg 117.935 kg     Body mass index is 39.53 kg/m.  General:  Well nourished, well developed, in  mild distress HEENT: normal Lymph: no adenopathy Neck: no JVD Endocrine:  No thryomegaly Cardiac:  normal S1, S2; RRR; no murmur  Lungs:  clear to auscultation bilaterally, no wheezing, rhonchi or rales  Abd: Firm, mildly distended. Ext: no edema Musculoskeletal:  No deformities, BUE and BLE strength normal and equal Skin: warm and dry  Neuro:  CNs 2-12 intact, no focal abnormalities noted Psych:  Normal affect   EKG:  The EKG was personally reviewed and demonstrates: Reviewed above Telemetry:  Telemetry was personally reviewed and demonstrates: Not on telemetry at present, just came from ED to floor.  Relevant CV Studies:  Echocardiogram 03/29/2018: - Left ventricle: The cavity size was normal. There was mild   concentric hypertrophy. Systolic function was normal. The   estimated ejection fraction was in the range of 60% to 65%. Wall   motion was normal; there were no regional wall motion   abnormalities. Features are consistent with a pseudonormal left   ventricular filling pattern, with concomitant abnormal relaxation   and increased  filling pressure (grade 2 diastolic dysfunction). - Left atrium: The atrium was normal in size. - Right ventricle: Systolic function was normal. - Pulmonary arteries: Systolic pressure could not be accurately   estimated.  Laboratory Data:  High Sensitivity Troponin:   Recent Labs  Lab 06/04/19 2318 06/05/19 0318  TROPONINIHS 162* 509*     Chemistry Recent Labs  Lab 06/04/19 2318 06/04/19 2336 06/04/19 2348  NA 136 137  --   K 2.5* 2.5* 2.8*  CL 99 98  --   CO2 24  --   --   GLUCOSE 104* 96  --   BUN 20 19  --   CREATININE 1.61* 1.60*  --   CALCIUM 8.1*  --   --   GFRNONAA 45*  --   --   GFRAA 52*  --   --   ANIONGAP 13  --   --     Recent Labs  Lab 06/04/19 2318  PROT 7.3  ALBUMIN 3.4*  AST 48*  ALT 28  ALKPHOS 64  BILITOT 1.5*   Hematology Recent Labs  Lab 06/04/19 2318 06/04/19 2336  WBC 21.0*  --   RBC 4.44   --   HGB 14.1 13.3  HCT 40.7 39.0  MCV 91.7  --   MCH 31.8  --   MCHC 34.6  --   RDW 12.5  --   PLT 218  --    BNP Recent Labs  Lab 06/04/19 2318  BNP 112.6*    DDimer No results for input(s): DDIMER in the last 168 hours.   Radiology/Studies:  Ct Angio Chest Pe W And/or Wo Contrast  Result Date: 06/05/2019 CLINICAL DATA:  63 year old male with increased shortness of breath and fever. EXAM: CT ANGIOGRAPHY CHEST CT ABDOMEN AND PELVIS WITH CONTRAST TECHNIQUE: Multidetector CT imaging of the chest was performed using the standard protocol during bolus administration of intravenous contrast. Multiplanar CT image reconstructions and MIPs were obtained to evaluate the vascular anatomy. Multidetector CT imaging of the abdomen and pelvis was performed using the standard protocol during bolus administration of intravenous contrast. CONTRAST:  15mL OMNIPAQUE IOHEXOL 350 MG/ML SOLN COMPARISON:  CT of the abdomen pelvis dated 05/01/2017 and chest radiograph dated 06/05/2019 FINDINGS: CTA CHEST FINDINGS Cardiovascular: There is no cardiomegaly or pericardial effusion. Coronary vascular calcification of the LAD. Mild atherosclerotic calcification of the thoracic aorta. No aneurysmal dilatation or dissection. Evaluation of the pulmonary arteries is very limited due to suboptimal opacification and timing of the contrast as well as respiratory motion artifact. No definite large central pulmonary artery embolus identified. Mediastinum/Nodes: There is no hilar or mediastinal adenopathy. The esophagus is grossly unremarkable. No mediastinal fluid collection. Lungs/Pleura: Minimal left lung base linear atelectasis/scarring. No focal consolidation, pleural effusion, or pneumothorax. The central airways are patent. Musculoskeletal: No acute osseous pathology. Review of the MIP images confirms the above findings. CT ABDOMEN and PELVIS FINDINGS No intra-abdominal free air or free fluid. Hepatobiliary: Diffuse fatty  infiltration of the liver. No intrahepatic biliary ductal dilatation. There is sludge or noncalcified stone in the gallbladder. No pericholecystic fluid or evidence of acute cholecystitis by CT. Pancreas: Unremarkable. No pancreatic ductal dilatation or surrounding inflammatory changes. Spleen: Normal in size without focal abnormality. Adrenals/Urinary Tract: The adrenal glands are unremarkable. There is no hydronephrosis on either side. There is symmetric enhancement and excretion of contrast by both kidneys. The visualized ureters and urinary bladder appear unremarkable. Minimal bilateral perinephric stranding, nonspecific. Correlation with urinalysis recommended to exclude UTI. Stomach/Bowel:  There is sigmoid diverticulosis without active inflammatory changes. There is no bowel obstruction or active inflammation. The appendix is normal. Vascular/Lymphatic: Advanced aortoiliac atherosclerotic disease. The IVC is unremarkable. No portal venous gas. There is no adenopathy. Reproductive: Enlarged prostate gland measuring 6 cm in transverse axial diameter. The seminal vesicles are symmetric. Other: None Musculoskeletal: Degenerative changes of the spine. No acute osseous pathology. Review of the MIP images confirms the above findings. IMPRESSION: 1. No acute intrathoracic, abdominal, or pelvic pathology. No CT evidence of central pulmonary artery embolus. 2. Fatty liver. 3. Sigmoid diverticulosis. No bowel obstruction or active inflammation. Normal appendix. 4. Gallbladder sludge or noncalcified stones. Aortic Atherosclerosis (ICD10-I70.0). Electronically Signed   By: Anner Crete M.D.   On: 06/05/2019 01:54   Ct Abdomen Pelvis W Contrast  Result Date: 06/05/2019 CLINICAL DATA:  63 year old male with increased shortness of breath and fever. EXAM: CT ANGIOGRAPHY CHEST CT ABDOMEN AND PELVIS WITH CONTRAST TECHNIQUE: Multidetector CT imaging of the chest was performed using the standard protocol during bolus  administration of intravenous contrast. Multiplanar CT image reconstructions and MIPs were obtained to evaluate the vascular anatomy. Multidetector CT imaging of the abdomen and pelvis was performed using the standard protocol during bolus administration of intravenous contrast. CONTRAST:  60mL OMNIPAQUE IOHEXOL 350 MG/ML SOLN COMPARISON:  CT of the abdomen pelvis dated 05/01/2017 and chest radiograph dated 06/05/2019 FINDINGS: CTA CHEST FINDINGS Cardiovascular: There is no cardiomegaly or pericardial effusion. Coronary vascular calcification of the LAD. Mild atherosclerotic calcification of the thoracic aorta. No aneurysmal dilatation or dissection. Evaluation of the pulmonary arteries is very limited due to suboptimal opacification and timing of the contrast as well as respiratory motion artifact. No definite large central pulmonary artery embolus identified. Mediastinum/Nodes: There is no hilar or mediastinal adenopathy. The esophagus is grossly unremarkable. No mediastinal fluid collection. Lungs/Pleura: Minimal left lung base linear atelectasis/scarring. No focal consolidation, pleural effusion, or pneumothorax. The central airways are patent. Musculoskeletal: No acute osseous pathology. Review of the MIP images confirms the above findings. CT ABDOMEN and PELVIS FINDINGS No intra-abdominal free air or free fluid. Hepatobiliary: Diffuse fatty infiltration of the liver. No intrahepatic biliary ductal dilatation. There is sludge or noncalcified stone in the gallbladder. No pericholecystic fluid or evidence of acute cholecystitis by CT. Pancreas: Unremarkable. No pancreatic ductal dilatation or surrounding inflammatory changes. Spleen: Normal in size without focal abnormality. Adrenals/Urinary Tract: The adrenal glands are unremarkable. There is no hydronephrosis on either side. There is symmetric enhancement and excretion of contrast by both kidneys. The visualized ureters and urinary bladder appear unremarkable.  Minimal bilateral perinephric stranding, nonspecific. Correlation with urinalysis recommended to exclude UTI. Stomach/Bowel: There is sigmoid diverticulosis without active inflammatory changes. There is no bowel obstruction or active inflammation. The appendix is normal. Vascular/Lymphatic: Advanced aortoiliac atherosclerotic disease. The IVC is unremarkable. No portal venous gas. There is no adenopathy. Reproductive: Enlarged prostate gland measuring 6 cm in transverse axial diameter. The seminal vesicles are symmetric. Other: None Musculoskeletal: Degenerative changes of the spine. No acute osseous pathology. Review of the MIP images confirms the above findings. IMPRESSION: 1. No acute intrathoracic, abdominal, or pelvic pathology. No CT evidence of central pulmonary artery embolus. 2. Fatty liver. 3. Sigmoid diverticulosis. No bowel obstruction or active inflammation. Normal appendix. 4. Gallbladder sludge or noncalcified stones. Aortic Atherosclerosis (ICD10-I70.0). Electronically Signed   By: Anner Crete M.D.   On: 06/05/2019 01:54   Dg Chest Portable 1 View  Result Date: 06/05/2019 CLINICAL DATA:  63 year old male with shortness  of breath. EXAM: PORTABLE CHEST 1 VIEW COMPARISON:  Chest radiograph dated 10/02/2014 FINDINGS: There is blunting of the left costophrenic angle which may represent trace left pleural effusion. Left lung base densities, likely atelectatic changes. Developing infiltrate is not excluded. Clinical correlation is recommended. The right lung is clear. There is no pneumothorax. The cardiac silhouette is within normal limits. No acute osseous pathology. IMPRESSION: Probable trace left pleural effusion and left lung base atelectasis. Developing infiltrate is not excluded. Clinical correlation recommended. Electronically Signed   By: Anner Crete M.D.   On: 06/05/2019 00:17    Assessment and Plan:   1.  Shortness of breath: Shortness of breath is likely secondary to  hypertensive urgency on admission with consequent elevated left ventricular end-diastolic filling pressures given his previously known grade 2 diastolic dysfunction.  Follow-up echocardiogram has been ordered and is pending.  He no longer complains of any shortness of breath and I suspect this is due to better blood pressure control.  2.  Elevated troponin/coronary artery disease: High-sensitivity troponins were 162 yesterday and 509 at 3:18 AM (by older scale this is consistent with 0.5 and 0.32).  This likely represents demand ischemia secondary to sepsis and hypertensive urgency.  He underwent a nuclear stress test in 2015 which showed a small inferolateral infarct with no significant ischemia. He currently denies chest pain or shortness of breath. I would continue aspirin 81 mg and atorvastatin 40 mg.  He does not require IV heparin at this time.  Follow-up echocardiogram has been ordered and is pending.  If there has been a decline in LV function, an outpatient nuclear stress test could be considered after he has recovered from sepsis.  3.  Urosepsis: Currently on cefepime, Flagyl, Zosyn and vancomycin.  4.  Hypertensive urgency: This is likely the cause for shortness of breath and troponin elevation.  He takes amlodipine 2.5 mg, hydrochlorothiazide 25 mg and losartan 100 mg in the outpatient setting.  Given ongoing sepsis and possibility of significant fluctuations in blood pressure, I would not change the doses at this time and continue IV hydralazine as needed for blood pressure control.  5.  Peripheral arterial disease: He has an occluded right SFA.  Denies claudication symptoms.  Continue aspirin and statin.   For questions or updates, please contact [place] Please consult [email or web address] for contact info under     Signed, Kate Sable, MD  06/05/2019 9:51 AM

## 2019-06-05 NOTE — ED Notes (Signed)
Date and time results received: 06/05/19 0017 (use smartphrase ".now" to insert current time)  Test: Troponin Critical Value:162  Name of Provider Notified: Palumbo MD  Orders Received? Or Actions Taken?: waiting on orders

## 2019-06-05 NOTE — Progress Notes (Addendum)
Patient seen and examined, admitted by Dr. Linda Hedges this morning.    Briefly patient is a 63 year old male with a history of diabetes mellitus, hypertension, bladder CA, who underwent cystoscopy on Thursday 10/22 by Dr. Karsten Ro with no finding of recurrent CVA.  Starting next day 10/23, patient started feeling sick with fevers, rigors, somnolence, decreased appetite and shortness of breath.  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  Patient was found to be febrile, septic, leukocytosis, hypokalemic, elevated troponin, trending  BP (!) 156/95   Pulse 100   Temp 99.2 F (37.3 C)   Resp (!) 33   Ht 5\' 8"  (1.727 m)   Wt 117.9 kg   SpO2 95%   BMI 39.53 kg/m   A/P Agree with assessment and plan per Dr. Linda Hedges, 1) sepsis secondary to UTI, pyelonephritis with recent instrumentation -Follow blood cultures, procalcitonin -Currently on IV Zosyn, follow urine culture and sensitivities -Urology notified, discussed with Dr. Tresa Moore  2) hypertension -BP currently elevated, placed on Norvasc 5 mg daily, Lopressor 25 mg twice daily, labetalol as needed with parameters -Await bmet for creatinine function,K and Mag, holding HCTZ, ACE inhibitor  3) multiple electrolyte abnormalities including hypokalemia, hypomagnesemia  -Patient has received replacement in ED, will recheck BMET  4) diabetes mellitus type 2, uncontrolled IDDM -Hemoglobin A1c 8.7 on 04/21/2019 -Patient on high-dose of Lantus at home 250 units every morning, requested diabetic coordinator to follow.  Hypoglycemia this a.m. with BS in 60s, decrease Lantus to 80 units a.m. and at bedtime.  Will adjust insulin regimen after reviewing his CBGs trend today.  5) Elevated troponin, trending up -Possibly due to sepsis however patient did complain of shortness of breath.  Last stress test in 2015 was negative. -Troponin  162-->  trended up to 5.9, obtain serial troponins, cardiology consulted.  EKG with repolarization changes in lateral leads. -Prior echo in 2019 had shown EF of 60 to 65% with grade 2 diastolic dysfunction  Will follow    [name] M.D. [facility] 06/05/2019, 10:31 AM  Pager: [phone]  Addendum Patient having watery diarrhea 3 bowel movements, had not taken any laxatives.  Septic, profound hypokalemia, hypomagnesemia  - will check C. difficile PCR, GI pathogen panel, - replace potassium, magnesium aggressively, placed on IV fluids, bear hugger -WBC count elevated 22.1, will change antibiotics, broad-spectrum, continue IV vancomycin, add cefepime and Flagyl.  DC Zosyn.  Follow sensitivities.   Estill Cotta M.D. [facility] 06/05/2019, 4:30 PM  Pager: [phone]

## 2019-06-05 NOTE — Consult Note (Signed)
Reason for Consult: Bladder Cancer, Suspect Urosepsis   Referring Physician: Ripu Rai MD  [name] is an 63 y.o. male.   HPI:   1 - Bladder Cancer -  Tag1 bladder cancer resected 2013 and compliant with surveillance now with yearly cysto.   2 - Suspect Urosepsis - fevers, malaise, WBC 26k UA with pyuria but scant bacteruria, Ct with some mild perinephric stranding (no hydro / fluid collections) c/w possible early pyelo / urosepsis. BCX,UCX 10/24 pending. C19 screen negative. Placed on empiric Cefipime. Had uncomplicated office cysto 10/22 at which time UA unimpressive. No prior CX data from our office to guide therapy.   PMH sig for DM2 (A1c 8s), CAD.   Today " [name] " is seen in consultation for above. He also has sig Trop I elevation.   Past Medical History:  Diagnosis Date  . Allergic rhinitis   . At risk for sleep apnea    STOP-BANG= 5   SENT TO PCP 03-14-2014  . Cataract    surgically removed bilateral  . Condyloma acuminatum of penis   . Diabetic neuropathy ([place])   . GERD (gastroesophageal reflux disease)   . History of bladder cancer    s/p  turbt  2013/   transitional cell carcinoma--   . History of condyloma acuminatum    PERINEAL AREA  W/ RECURRENCY  . History of gout   . Hyperlipidemia   . Hypertension   . Lower urinary tract symptoms (LUTS)   . Productive cough   . PVD (peripheral vascular disease) with claudication (HCC)    bilateral SFA disease-- right > left  and left tibial artery disease--  per duplex  . Smokers' cough ([place])   . Type 2 diabetes mellitus with insulin therapy ([place])    monitor by  dr ellsion  . Wears dentures    upper    Past Surgical History:  Procedure Laterality Date  . AXILLARY HIDRADENITIS EXCISION  1997  . CARDIOVASCULAR STRESS TEST  07-24-2014  dr Kathlyn [place]   Low risk lexiscan nuclear study with apical thinning and small inferolateral wall infarct at mid & basal level , no ischemia/  normal LVF and wall motion , ef 59%   . CATARACT EXTRACTION Left   . CATARACT EXTRACTION W/ INTRAOCULAR LENS IMPLANT Right   . CO2 LASER APPLICATION N/A [id number]   Procedure: CO2 LASER APPLICATION,PENIS, GROIN, ANUS;  Surgeon: [name], MD;  Location: [facility];  Service: General;  Laterality: N/A;  . CO2 LASER APPLICATION N/A [id number]   Procedure: CO2 LASER APPLICATION;  Surgeon: Kathie Rhodes, MD;  Location: [facility];  Service: Urology;  Laterality: N/A;  . CONDYLOMA EXCISION/FULGURATION N/A 05/21/2015   Procedure: CONDYLOMA REMOVAL;  Surgeon: Kathie Rhodes, MD;  Location: [facility];  Service: Urology;  Laterality: N/A;  . HEMORRHOID SURGERY  10/24/2014   Procedure: HEMORRHOIDECTOMY;  Surgeon: Michael Boston, MD;  Location: [facility];  Service: General;;  . INCISION AND DRAINAGE ABSCESS Left 10/24/2014   Procedure: INCISION AND DRAINAGE ABSCESS;  Surgeon: Michael Boston, MD;  Location: [facility];  Service: General;  Laterality: Left;  . INGUINAL HIDRADENITIS EXCISION  1998, 1999  . LASER ABLATION CONDOLAMATA N/A 03/20/2014   Procedure: EXAM UNDER ANESTHESIA, REMOVAL/ABLATION OF CONDYLOMATA PENIS,GROINS, ANUS, ANAL CANAL;  Surgeon: [name], MD;  Location: [place];  Service: General;  Laterality: N/A;  groin and anus  . LASER ABLATION CONDOLAMATA N/A 10/24/2014  Procedure: LASER ABLATION CONDOLAMATA;  Surgeon: Michael Boston, MD;  Location: [facility];  Service: General;  Laterality: N/A;  . LASER ABLATION OF PENILE AND PERIANAL WARTS  07-29-2007  Dr. Johney [place]  . LEFT SHOULDER SURGERY  2003  . MASS EXCISION N/A 10/24/2014   Procedure: EXCISION OF PERINEAL MASS/SINUS;  Surgeon: Michael Boston, MD;  Location: [facility];  Service: General;  Laterality: N/A;  . MOHS SURGERY     back  . MOHS SURGERY  2017   face  . MULTIPLE TOOTH EXTRACTIONS    . PERINEAL HIDRADENITIS EXCISION  1998, 1999  .  TRANSURETHRAL RESECTION OF BLADDER TUMOR  05/21/2012   Procedure: TRANSURETHRAL RESECTION OF BLADDER TUMOR (TURBT);  Surgeon: Claybon Jabs, MD;  Location: [facility];  Service: Urology;  Laterality: N/A;       Family History  Problem Relation Age of Onset  . Hypertension Mother   . Cancer Father        lung ca  . Diabetes Maternal Aunt        x 2  . Colon cancer Neg Hx   . Esophageal cancer Neg Hx   . Pancreatic cancer Neg Hx   . Prostate cancer Neg Hx   . Kidney disease Neg Hx   . Liver disease Neg Hx   . Lung cancer Neg Hx   . Rectal cancer Neg Hx   . Stomach cancer Neg Hx     Social History:  reports that he quit smoking about 3 years ago. His smoking use included cigars and cigarettes. He has a 41.00 pack-year smoking history. He has never used smokeless tobacco. He reports that he does not drink alcohol or use drugs.  Allergies: No Known Allergies  Medications: I have reviewed the patient's current medications.  Results for orders placed or performed during the hospital encounter of 06/04/19 (from the past 48 hour(s))  CBC with Differential/Platelet     Status: Abnormal   Collection Time: 06/04/19 11:18 PM  Result Value Ref Range   WBC 21.0 (H) 4.0 - 10.5 K/uL   RBC 4.44 4.22 - 5.81 MIL/uL   Hemoglobin 14.1 13.0 - 17.0 g/dL   HCT 40.7 39.0 - 52.0 %   MCV 91.7 80.0 - 100.0 fL   MCH 31.8 26.0 - 34.0 pg   MCHC 34.6 30.0 - 36.0 g/dL   RDW 12.5 11.5 - 15.5 %   Platelets 218 150 - 400 K/uL   nRBC 0.0 0.0 - 0.2 %   Neutrophils Relative % 86 %   Neutro Abs 18.0 (H) 1.7 - 7.7 K/uL   Lymphocytes Relative 6 %   Lymphs Abs 1.3 0.7 - 4.0 K/uL   Monocytes Relative 7 %   Monocytes Absolute 1.4 (H) 0.1 - 1.0 K/uL   Eosinophils Relative 0 %   Eosinophils Absolute 0.0 0.0 - 0.5 K/uL   Basophils Relative 0 %   Basophils Absolute 0.1 0.0 - 0.1 K/uL   Immature Granulocytes 1 %   Abs Immature Granulocytes 0.26 (H) 0.00 - 0.07 K/uL    Comment: Performed at  [facility], [place] [address], [place], [place] [address]  Troponin I (High Sensitivity)     Status: Abnormal   Collection Time: 06/04/19 11:18 PM  Result Value Ref Range   Troponin I (High Sensitivity) 162 (HH) <18 ng/L    Comment: CRITICAL RESULT CALLED TO, READ BACK BY AND VERIFIED WITH: NASH,J AT 0016 ON 06/05/2019 BY MOSLEY,J  (  NOTE) Elevated high sensitivity troponin I (hsTnI) values and significant  changes across serial measurements may suggest ACS but many other  chronic and acute conditions are known to elevate hsTnI results.  Refer to the Links section for chest pain algorithms and additional  guidance. Performed at [facility], [place] [address] [place], [place] [address]   Brain natriuretic peptide     Status: Abnormal   Collection Time: 06/04/19 11:18 PM  Result Value Ref Range   B Natriuretic Peptide 112.6 (H) 0.0 - 100.0 pg/mL    Comment: Performed at [facility], [place] [address], [place], [place] [address]  Comprehensive metabolic panel     Status: Abnormal   Collection Time: 06/04/19 11:18 PM  Result Value Ref Range   Sodium 136 135 - 145 mmol/L   Potassium 2.5 (LL) 3.5 - 5.1 mmol/L    Comment: CRITICAL RESULT CALLED TO, READ BACK BY AND VERIFIED WITH: NASH,J AT 0016 ON 06/05/2019 BY MOSLEY,J     Chloride 99 98 - 111 mmol/L   CO2 24 22 - 32 mmol/L   Glucose, Bld 104 (H) 70 - 99 mg/dL   BUN 20 8 - 23 mg/dL   Creatinine, Ser 1.61 (H) 0.61 - 1.24 mg/dL   Calcium 8.1 (L) 8.9 - 10.3 mg/dL   Total Protein 7.3 6.5 - 8.1 g/dL   Albumin 3.4 (L) 3.5 - 5.0 g/dL   AST 48 (H) 15 - 41 U/L   ALT 28 0 - 44 U/L   Alkaline Phosphatase 64 38 - 126 U/L   Total Bilirubin 1.5 (H) 0.3 - 1.2 mg/dL   GFR calc non Af Amer 45 (L) >60 mL/min   GFR calc Af Amer 52 (L) >60 mL/min   Anion gap 13 5 - 15    Comment: Performed at [facility], [place] [address] [place], [place] [address]  Protime-INR     Status: None    Collection Time: 06/04/19 11:18 PM  Result Value Ref Range   Prothrombin Time 14.3 11.4 - 15.2 seconds   INR 1.1 0.8 - 1.2    Comment: (NOTE) INR goal varies based on device and disease states. Performed at [facility], [place] [address], [place], [place] [address]   Lactic acid, plasma     Status: Abnormal   Collection Time: 06/04/19 11:23 PM  Result Value Ref Range   Lactic Acid, Venous 3.0 (HH) 0.5 - 1.9 mmol/L    Comment: CRITICAL RESULT CALLED TO, READ BACK BY AND VERIFIED WITH: NASH,J AT 0014 ON 06/05/2019 BY MOSLEY,J Performed at [facility], [place] [address], [place], [place] [address]   Urinalysis, Routine w reflex microscopic     Status: Abnormal   Collection Time: 06/04/19 11:23 PM  Result Value Ref Range   Color, Urine YELLOW YELLOW   APPearance CLEAR CLEAR   Specific Gravity, Urine 1.008 1.005 - 1.030   pH 6.0 5.0 - 8.0   Glucose, UA NEGATIVE NEGATIVE mg/dL   Hgb urine dipstick LARGE (A) NEGATIVE   Bilirubin Urine NEGATIVE NEGATIVE   Ketones, ur NEGATIVE NEGATIVE mg/dL   Protein, ur 100 (A) NEGATIVE mg/dL   Nitrite NEGATIVE NEGATIVE   Leukocytes,Ua MODERATE (A) NEGATIVE   RBC / HPF 0-5 0 - 5 RBC/hpf   WBC, UA >50 (H) 0 - 5 WBC/hpf   Bacteria, UA RARE (A) NONE SEEN   Squamous Epithelial / LPF 0-5 0 - 5   WBC Clumps PRESENT  Mucus PRESENT     Comment: Performed at [facility], [place] [address] [place], [place] [address]  I-stat chem 8, ED (not at [facility] or [facility])     Status: Abnormal   Collection Time: 06/04/19 11:36 PM  Result Value Ref Range   Sodium 137 135 - 145 mmol/L   Potassium 2.5 (LL) 3.5 - 5.1 mmol/L   Chloride 98 98 - 111 mmol/L   BUN 19 8 - 23 mg/dL   Creatinine, Ser 1.60 (H) 0.61 - 1.24 mg/dL   Glucose, Bld 96 70 - 99 mg/dL   Calcium, Ion 1.03 (L) 1.15 - 1.40 mmol/L   TCO2 25 22 - 32 mmol/L   Hemoglobin 13.3 13.0 - 17.0 g/dL   HCT 39.0 39.0 - 52.0 %   Comment NOTIFIED PHYSICIAN    Potassium     Status: Abnormal   Collection Time: 06/04/19 11:48 PM  Result Value Ref Range   Potassium 2.8 (L) 3.5 - 5.1 mmol/L    Comment: Performed at [facility], [place] [address] [place], [place] [address]  Magnesium     Status: Abnormal   Collection Time: 06/04/19 11:48 PM  Result Value Ref Range   Magnesium 1.1 (L) 1.7 - 2.4 mg/dL    Comment: Performed at [facility], [place] [address] [place], [place] [address]  APTT     Status: None   Collection Time: 06/05/19 12:41 AM  Result Value Ref Range   aPTT 31 24 - 36 seconds    Comment: Performed at [facility], [place] [address] [place],  [address]  SARS Coronavirus 2 by RT PCR (hospital order, performed in [facility] hospital lab) Nasopharyngeal Nasopharyngeal Swab     Status: None   Collection Time: 06/05/19  1:10 AM   Specimen: Nasopharyngeal Swab  Result Value Ref Range   SARS Coronavirus 2 NEGATIVE NEGATIVE    Comment: (NOTE) If result is NEGATIVE SARS-CoV-2 target nucleic acids are NOT DETECTED. The SARS-CoV-2 RNA is generally detectable in upper and lower  respiratory specimens during the acute phase of infection. The lowest  concentration of SARS-CoV-2 viral copies this assay can detect is 250  copies / mL. A negative result does not preclude SARS-CoV-2 infection  and should not be used as the sole basis for treatment or other  patient management decisions.  A negative result may occur with  improper specimen collection / handling, submission of specimen other  than nasopharyngeal swab, presence of viral mutation(s) within the  areas targeted by this assay, and inadequate number of viral copies  (<250 copies / mL). A negative result must be combined with clinical  observations, patient history, and epidemiological information. If result is POSITIVE SARS-CoV-2 target nucleic acids are DETECTED. The SARS-CoV-2 RNA is generally detectable in upper and lower   respiratory specimens dur ing the acute phase of infection.  Positive  results are indicative of active infection with SARS-CoV-2.  Clinical  correlation with patient history and other diagnostic information is  necessary to determine patient infection status.  Positive results do  not rule out bacterial infection or co-infection with other viruses. If result is PRESUMPTIVE POSTIVE SARS-CoV-2 nucleic acids MAY BE PRESENT.   A presumptive positive result was obtained on the submitted specimen  and confirmed on repeat testing.  While 2019 novel coronavirus  (SARS-CoV-2) nucleic acids may be present in the submitted sample  additional confirmatory testing may be necessary for epidemiological  and / or clinical  management purposes  to differentiate between  SARS-CoV-2 and other Sarbecovirus currently known to infect humans.  If clinically indicated additional testing with an alternate test  methodology [id number]) is advised. The SARS-CoV-2 RNA is generally  detectable in upper and lower respiratory sp ecimens during the acute  phase of infection. The expected result is Negative. Fact Sheet for Patients:  StrictlyIdeas.no Fact Sheet for Healthcare Providers: BankingDealers.co.za This test is not yet approved or cleared by the [place] FDA and has been authorized for detection and/or diagnosis of SARS-CoV-2 by FDA under an Emergency Use Authorization (EUA).  This EUA will remain in effect (meaning this test can be used) for the duration of the COVID-19 declaration under Section 564(b)(1) of the Act, 21 U.S.C. section 360bbb-3(b)(1), unless the authorization is terminated or revoked sooner. Performed at [facility], [place] [address], [place], [place] [address]   Lactic acid, plasma     Status: None   Collection Time: 06/05/19  3:18 AM  Result Value Ref Range   Lactic Acid, Venous 1.5 0.5 - 1.9 mmol/L    Comment:  Performed at [facility], [place] [address] [place], [place] [address]  Troponin I (High Sensitivity)     Status: Abnormal   Collection Time: 06/05/19  3:18 AM  Result Value Ref Range   Troponin I (High Sensitivity) 509 (HH) <18 ng/L    Comment: CRITICAL VALUE NOTED.  VALUE IS CONSISTENT WITH PREVIOUSLY REPORTED AND CALLED VALUE. (NOTE) Elevated high sensitivity troponin I (hsTnI) values and significant  changes across serial measurements may suggest ACS but many other  chronic and acute conditions are known to elevate hsTnI results.  Refer to the Links section for chest pain algorithms and additional  guidance. Performed at [facility], [place] [address], [place], [place] [address]   CBG monitoring, ED     Status: Abnormal   Collection Time: 06/05/19  7:54 AM  Result Value Ref Range   Glucose-Capillary 63 (L) 70 - 99 mg/dL  CBC with Differential/Platelet     Status: Abnormal   Collection Time: 06/05/19 10:47 AM  Result Value Ref Range   WBC 22.1 (H) 4.0 - 10.5 K/uL   RBC 4.15 (L) 4.22 - 5.81 MIL/uL   Hemoglobin 13.3 13.0 - 17.0 g/dL   HCT 37.9 (L) 39.0 - 52.0 %   MCV 91.3 80.0 - 100.0 fL   MCH 32.0 26.0 - 34.0 pg   MCHC 35.1 30.0 - 36.0 g/dL   RDW 12.7 11.5 - 15.5 %   Platelets 182 150 - 400 K/uL   nRBC 0.0 0.0 - 0.2 %   Neutrophils Relative % 85 %   Neutro Abs 18.9 (H) 1.7 - 7.7 K/uL   Lymphocytes Relative 7 %   Lymphs Abs 1.5 0.7 - 4.0 K/uL   Monocytes Relative 7 %   Monocytes Absolute 1.6 (H) 0.1 - 1.0 K/uL   Eosinophils Relative 0 %   Eosinophils Absolute 0.0 0.0 - 0.5 K/uL   Basophils Relative 0 %   Basophils Absolute 0.1 0.0 - 0.1 K/uL   Immature Granulocytes 1 %   Abs Immature Granulocytes 0.17 (H) 0.00 - 0.07 K/uL    Comment: Performed at [facility], [place] [address] [place], [place] [address]    Ct Angio Chest Pe W And/or Wo Contrast  Result Date: 06/05/2019 CLINICAL DATA:  63 year old male with  increased shortness of breath and fever. EXAM: CT ANGIOGRAPHY CHEST CT ABDOMEN AND PELVIS WITH  CONTRAST TECHNIQUE: Multidetector CT imaging of the chest was performed using the standard protocol during bolus administration of intravenous contrast. Multiplanar CT image reconstructions and MIPs were obtained to evaluate the vascular anatomy. Multidetector CT imaging of the abdomen and pelvis was performed using the standard protocol during bolus administration of intravenous contrast. CONTRAST:  32mL OMNIPAQUE IOHEXOL 350 MG/ML SOLN COMPARISON:  CT of the abdomen pelvis dated 05/01/2017 and chest radiograph dated 06/05/2019 FINDINGS: CTA CHEST FINDINGS Cardiovascular: There is no cardiomegaly or pericardial effusion. Coronary vascular calcification of the LAD. Mild atherosclerotic calcification of the thoracic aorta. No aneurysmal dilatation or dissection. Evaluation of the pulmonary arteries is very limited due to suboptimal opacification and timing of the contrast as well as respiratory motion artifact. No definite large central pulmonary artery embolus identified. Mediastinum/Nodes: There is no hilar or mediastinal adenopathy. The esophagus is grossly unremarkable. No mediastinal fluid collection. Lungs/Pleura: Minimal left lung base linear atelectasis/scarring. No focal consolidation, pleural effusion, or pneumothorax. The central airways are patent. Musculoskeletal: No acute osseous pathology. Review of the MIP images confirms the above findings. CT ABDOMEN and PELVIS FINDINGS No intra-abdominal free air or free fluid. Hepatobiliary: Diffuse fatty infiltration of the liver. No intrahepatic biliary ductal dilatation. There is sludge or noncalcified stone in the gallbladder. No pericholecystic fluid or evidence of acute cholecystitis by CT. Pancreas: Unremarkable. No pancreatic ductal dilatation or surrounding inflammatory changes. Spleen: Normal in size without focal abnormality. Adrenals/Urinary Tract: The  adrenal glands are unremarkable. There is no hydronephrosis on either side. There is symmetric enhancement and excretion of contrast by both kidneys. The visualized ureters and urinary bladder appear unremarkable. Minimal bilateral perinephric stranding, nonspecific. Correlation with urinalysis recommended to exclude UTI. Stomach/Bowel: There is sigmoid diverticulosis without active inflammatory changes. There is no bowel obstruction or active inflammation. The appendix is normal. Vascular/Lymphatic: Advanced aortoiliac atherosclerotic disease. The IVC is unremarkable. No portal venous gas. There is no adenopathy. Reproductive: Enlarged prostate gland measuring 6 cm in transverse axial diameter. The seminal vesicles are symmetric. Other: None Musculoskeletal: Degenerative changes of the spine. No acute osseous pathology. Review of the MIP images confirms the above findings. IMPRESSION: 1. No acute intrathoracic, abdominal, or pelvic pathology. No CT evidence of central pulmonary artery embolus. 2. Fatty liver. 3. Sigmoid diverticulosis. No bowel obstruction or active inflammation. Normal appendix. 4. Gallbladder sludge or noncalcified stones. Aortic Atherosclerosis (ICD10-I70.0). Electronically Signed   By: Anner Crete M.D.   On: 06/05/2019 01:54   Ct Abdomen Pelvis W Contrast  Result Date: 06/05/2019 CLINICAL DATA:  63 year old male with increased shortness of breath and fever. EXAM: CT ANGIOGRAPHY CHEST CT ABDOMEN AND PELVIS WITH CONTRAST TECHNIQUE: Multidetector CT imaging of the chest was performed using the standard protocol during bolus administration of intravenous contrast. Multiplanar CT image reconstructions and MIPs were obtained to evaluate the vascular anatomy. Multidetector CT imaging of the abdomen and pelvis was performed using the standard protocol during bolus administration of intravenous contrast. CONTRAST:  38mL OMNIPAQUE IOHEXOL 350 MG/ML SOLN COMPARISON:  CT of the abdomen pelvis  dated 05/01/2017 and chest radiograph dated 06/05/2019 FINDINGS: CTA CHEST FINDINGS Cardiovascular: There is no cardiomegaly or pericardial effusion. Coronary vascular calcification of the LAD. Mild atherosclerotic calcification of the thoracic aorta. No aneurysmal dilatation or dissection. Evaluation of the pulmonary arteries is very limited due to suboptimal opacification and timing of the contrast as well as respiratory motion artifact. No definite large central pulmonary artery embolus identified. Mediastinum/Nodes: There is no hilar or mediastinal adenopathy. The esophagus  is grossly unremarkable. No mediastinal fluid collection. Lungs/Pleura: Minimal left lung base linear atelectasis/scarring. No focal consolidation, pleural effusion, or pneumothorax. The central airways are patent. Musculoskeletal: No acute osseous pathology. Review of the MIP images confirms the above findings. CT ABDOMEN and PELVIS FINDINGS No intra-abdominal free air or free fluid. Hepatobiliary: Diffuse fatty infiltration of the liver. No intrahepatic biliary ductal dilatation. There is sludge or noncalcified stone in the gallbladder. No pericholecystic fluid or evidence of acute cholecystitis by CT. Pancreas: Unremarkable. No pancreatic ductal dilatation or surrounding inflammatory changes. Spleen: Normal in size without focal abnormality. Adrenals/Urinary Tract: The adrenal glands are unremarkable. There is no hydronephrosis on either side. There is symmetric enhancement and excretion of contrast by both kidneys. The visualized ureters and urinary bladder appear unremarkable. Minimal bilateral perinephric stranding, nonspecific. Correlation with urinalysis recommended to exclude UTI. Stomach/Bowel: There is sigmoid diverticulosis without active inflammatory changes. There is no bowel obstruction or active inflammation. The appendix is normal. Vascular/Lymphatic: Advanced aortoiliac atherosclerotic disease. The IVC is unremarkable. No  portal venous gas. There is no adenopathy. Reproductive: Enlarged prostate gland measuring 6 cm in transverse axial diameter. The seminal vesicles are symmetric. Other: None Musculoskeletal: Degenerative changes of the spine. No acute osseous pathology. Review of the MIP images confirms the above findings. IMPRESSION: 1. No acute intrathoracic, abdominal, or pelvic pathology. No CT evidence of central pulmonary artery embolus. 2. Fatty liver. 3. Sigmoid diverticulosis. No bowel obstruction or active inflammation. Normal appendix. 4. Gallbladder sludge or noncalcified stones. Aortic Atherosclerosis (ICD10-I70.0). Electronically Signed   By: Anner Crete M.D.   On: 06/05/2019 01:54   Dg Chest Portable 1 View  Result Date: 06/05/2019 CLINICAL DATA:  63 year old male with shortness of breath. EXAM: PORTABLE CHEST 1 VIEW COMPARISON:  Chest radiograph dated 10/02/2014 FINDINGS: There is blunting of the left costophrenic angle which may represent trace left pleural effusion. Left lung base densities, likely atelectatic changes. Developing infiltrate is not excluded. Clinical correlation is recommended. The right lung is clear. There is no pneumothorax. The cardiac silhouette is within normal limits. No acute osseous pathology. IMPRESSION: Probable trace left pleural effusion and left lung base atelectasis. Developing infiltrate is not excluded. Clinical correlation recommended. Electronically Signed   By: Anner Crete M.D.   On: 06/05/2019 00:17    Review of Systems  Constitutional: Positive for fever and malaise/fatigue.  HENT: Negative.   Eyes: Negative.   Respiratory: Negative.   Cardiovascular: Negative.   Gastrointestinal: Negative.   Genitourinary: Positive for urgency.  Musculoskeletal: Negative.   Skin: Negative.   Neurological: Negative.   Endo/Heme/Allergies: Negative.   Psychiatric/Behavioral: Negative.    Blood pressure (!) 185/82, pulse (!) 104, temperature 100.3 F (37.9 C),  temperature source Oral, resp. rate (!) 34, height 5\' 8"  (1.727 m), weight 118.4 kg, SpO2 96 %. Physical Exam  Constitutional: He appears well-developed.  Tired in hospital room but cooperative and arousable.   HENT:  Head: Normocephalic.  Eyes: Pupils are equal, round, and reactive to light.  Neck: Normal range of motion.  Cardiovascular:  Regular tachycardia.   Respiratory:  Non-labored on minimal NCO2  GI:  Very large truncal obesity limits sensitivity of exam.   Genitourinary:    Genitourinary Comments: No significatn CVAT. Stable (per pt) penile shaft and foreskin edema with reduced foreskin.    Neurological: He is alert.  Skin: Skin is warm.  Psychiatric: He has a normal mood and affect.    Assessment/Plan:  1 - Bladder Cancer -  Up to  date on surveillance. No further evaluation this admission.   2 - Suspect Urosepsis - agree with current ABX pending additional CX data as we have not additional data from office labs to guide therapy. No complicating features requiring procedural intervention by imaging and exam. Also agree with vigorous glycemic control.  We will make Dr. Karsten Ro aware of admission. Please call with questions anytime .  Alexis Frock 06/05/2019, 10:58 AM

## 2019-06-05 NOTE — Progress Notes (Signed)
Pt has had 3 loose bowel movements, pt has not taken senna during this admission. Notified Dr Tana Coast.

## 2019-06-06 ENCOUNTER — Telehealth: Payer: Self-pay | Admitting: Internal Medicine

## 2019-06-06 DIAGNOSIS — I10 Essential (primary) hypertension: Secondary | ICD-10-CM | POA: Diagnosis not present

## 2019-06-06 DIAGNOSIS — C679 Malignant neoplasm of bladder, unspecified: Secondary | ICD-10-CM

## 2019-06-06 DIAGNOSIS — A419 Sepsis, unspecified organism: Secondary | ICD-10-CM | POA: Diagnosis not present

## 2019-06-06 DIAGNOSIS — R197 Diarrhea, unspecified: Secondary | ICD-10-CM | POA: Diagnosis not present

## 2019-06-06 DIAGNOSIS — R778 Other specified abnormalities of plasma proteins: Secondary | ICD-10-CM

## 2019-06-06 DIAGNOSIS — E0800 Diabetes mellitus due to underlying condition with hyperosmolarity without nonketotic hyperglycemic-hyperosmolar coma (NKHHC): Secondary | ICD-10-CM | POA: Diagnosis not present

## 2019-06-06 LAB — GLUCOSE, CAPILLARY
Glucose-Capillary: 170 mg/dL — ABNORMAL HIGH (ref 70–99)
Glucose-Capillary: 100 mg/dL — ABNORMAL HIGH (ref 70–99)
Glucose-Capillary: 254 mg/dL — ABNORMAL HIGH (ref 70–99)
Glucose-Capillary: 237 mg/dL — ABNORMAL HIGH (ref 70–99)
Glucose-Capillary: 254 mg/dL — ABNORMAL HIGH (ref 70–99)

## 2019-06-06 LAB — BASIC METABOLIC PANEL
Anion gap: 11 (ref 5–15)
BUN: 21 mg/dL (ref 8–23)
CO2: 20 mmol/L — ABNORMAL LOW (ref 22–32)
Calcium: 7.8 mg/dL — ABNORMAL LOW (ref 8.9–10.3)
Chloride: 107 mmol/L (ref 98–111)
Creatinine, Ser: 1.39 mg/dL — ABNORMAL HIGH (ref 0.61–1.24)
GFR calc Af Amer: >60 mL/min (ref >60)
GFR calc non Af Amer: 54 mL/min — ABNORMAL LOW (ref >60)
Glucose, Bld: 117 mg/dL — ABNORMAL HIGH (ref 70–99)
Potassium: 3.8 mmol/L (ref 3.5–5.1)
Sodium: 138 mmol/L (ref 135–145)

## 2019-06-06 LAB — CBC
HCT: 37.9 % — ABNORMAL LOW (ref 39.0–52.0)
Hemoglobin: 12.8 g/dL — ABNORMAL LOW (ref 13.0–17.0)
MCH: 31.5 pg (ref 26.0–34.0)
MCHC: 33.8 g/dL (ref 30.0–36.0)
MCV: 93.3 fL (ref 80.0–100.0)
Platelets: 172 10*3/uL (ref 150–400)
RBC: 4.06 MIL/uL — ABNORMAL LOW (ref 4.22–5.81)
RDW: 12.9 % (ref 11.5–15.5)
WBC: 20.3 10*3/uL — ABNORMAL HIGH (ref 4.0–10.5)
nRBC: 0 % (ref 0.0–0.2)

## 2019-06-06 MED ORDER — LOPERAMIDE HCL 2 MG PO CAPS
2.0000 mg | ORAL_CAPSULE | ORAL | Status: DC | PRN
  Administered 2019-06-06 (×2): 2 mg via ORAL
  Filled 2019-06-06 (×2): qty 1

## 2019-06-06 NOTE — Progress Notes (Addendum)
Progress Note  Patient Name: [name] Date of Encounter: 06/06/2019  Primary Cardiologist: Kathlyn Sacramento, MD   Subjective   No acute overnight events. Patient had a temperature of 103.5 yesterday evening around 17:00. He reported some chills sometimes yesterday but is not sure if it was at the time of the fever. He notes some shortness of breath off and on as well as some off and on right sided chest pain that improves with home inhaler. Unsure what inhaler he is referring to as he does not have any listed on PTA medications. Denies any recent palpitations.  Inpatient Medications    Scheduled Meds: . amLODipine  5 mg Oral Daily  . aspirin EC  81 mg Oral Daily  . Chlorhexidine Gluconate Cloth  6 each Topical Daily  . enoxaparin (LOVENOX) injection  40 mg Subcutaneous Q24H  . insulin aspart  0-20 Units Subcutaneous TID WC  . insulin glargine  50 Units Subcutaneous BID  . loratadine  10 mg Oral Daily  . metoprolol tartrate  25 mg Oral BID  . pantoprazole  40 mg Oral Daily  . potassium chloride SA  20 mEq Oral Daily   Continuous Infusions: . sodium chloride 125 mL/hr at 06/06/19 0544  . ceFEPime (MAXIPIME) IV Stopped (06/06/19 [id number])  . metronidazole Stopped (06/06/19 0143)  . vancomycin Stopped (06/06/19 0512)   PRN Meds: acetaminophen, labetalol, ondansetron (ZOFRAN) IV   Vital Signs    Vitals:   06/06/19 0300 06/06/19 0410 06/06/19 0500 06/06/19 0600  BP: (!) 140/51 (!) 147/55 (!) 139/58 (!) 158/62  Pulse: 78 81 74 82  Resp: (!) 26 (!) 24 (!) 21 (!) 27  Temp:  98.7 F (37.1 C)    TempSrc:  Oral    SpO2: 94% 95% 93% 95%  Weight:      Height:        Intake/Output Summary (Last 24 hours) at 06/06/2019 0732 Last data filed at 06/06/2019 0600 Gross per 24 hour  Intake 3116.5 ml  Output 3325 ml  Net -208.5 ml   Last 3 Weights 06/05/2019 06/05/2019 04/21/2019  Weight (lbs) 261 lb 0.4 oz 260 lb 258 lb  Weight (kg) 118.4 kg 117.935 kg 117.028 kg       Telemetry    Normal sinus rhythm. Rates were in the 90's to 120's yesterday but have improved to 70's to 80's today with addition of Lopressor. Brief run of SVT yesterday. PVC/PACs also noted. - Personally Reviewed  ECG    No new ECG tracing today. - Personally Reviewed  Physical Exam   GEN: Obese Caucasian male resting comfortably in no acute distress.   Neck: Supple.  Cardiac: RRR. No murmurs, rubs, or gallops. Radial pulses 2+ and equal bilaterally. Left distal pedal pulse 2+. Difficult to palpate right distal pedal pulse (has known occluded right SFA). Respiratory: No increased work of breathing. Decreased breath sounds throughout but lungs sounds clear to auscultation bilaterally. No significant wheezes, rhonchi, or rales appreciated. GI: Soft, obese, and non-tender. Bowel sounds present. MS: No edema. No deformity. Neuro:  No focal deficits. Skin: Warm and dry. Psych: Normal affect. Responds appropriately.  Labs    High Sensitivity Troponin:   Recent Labs  Lab 06/04/19 2318 06/05/19 0318 06/05/19 1047 06/05/19 1240  TROPONINIHS 162* 509* 741* 666*      Chemistry Recent Labs  Lab 06/04/19 2318 06/04/19 2336  06/05/19 1047 06/05/19 1930 06/06/19 0249  NA 136 137  --  138  --  138  K 2.5* 2.5*   < > 2.9* 3.1* 3.8  CL 99 98  --  104  --  107  CO2 24  --   --  21*  --  20*  GLUCOSE 104* 96  --  177*  --  117*  BUN 20 19  --  16  --  21  CREATININE 1.61* 1.60*  --  1.27*  --  1.39*  CALCIUM 8.1*  --   --  7.7*  --  7.8*  PROT 7.3  --   --  6.6  --   --   ALBUMIN 3.4*  --   --  3.1*  --   --   AST 48*  --   --  192*  --   --   ALT 28  --   --  36  --   --   ALKPHOS 64  --   --  62  --   --   BILITOT 1.5*  --   --  1.1  --   --   GFRNONAA 45*  --   --  >60  --  54*  GFRAA 52*  --   --  >60  --  >60  ANIONGAP 13  --   --  13  --  11   < > = values in this interval not displayed.     Hematology Recent Labs  Lab 06/04/19 2318 06/04/19 2336 06/05/19 1047  06/06/19 0249  WBC 21.0*  --  22.1* 20.3*  RBC 4.44  --  4.15* 4.06*  HGB 14.1 13.3 13.3 12.8*  HCT 40.7 39.0 37.9* 37.9*  MCV 91.7  --  91.3 93.3  MCH 31.8  --  32.0 31.5  MCHC 34.6  --  35.1 33.8  RDW 12.5  --  12.7 12.9  PLT 218  --  182 172    BNP Recent Labs  Lab 06/04/19 2318  BNP 112.6*     DDimer No results for input(s): DDIMER in the last 168 hours.   Radiology    Ct Angio Chest Pe W And/or Wo Contrast  Result Date: 06/05/2019 CLINICAL DATA:  63 year old male with increased shortness of breath and fever. EXAM: CT ANGIOGRAPHY CHEST CT ABDOMEN AND PELVIS WITH CONTRAST TECHNIQUE: Multidetector CT imaging of the chest was performed using the standard protocol during bolus administration of intravenous contrast. Multiplanar CT image reconstructions and MIPs were obtained to evaluate the vascular anatomy. Multidetector CT imaging of the abdomen and pelvis was performed using the standard protocol during bolus administration of intravenous contrast. CONTRAST:  47mL OMNIPAQUE IOHEXOL 350 MG/ML SOLN COMPARISON:  CT of the abdomen pelvis dated 05/01/2017 and chest radiograph dated 06/05/2019 FINDINGS: CTA CHEST FINDINGS Cardiovascular: There is no cardiomegaly or pericardial effusion. Coronary vascular calcification of the LAD. Mild atherosclerotic calcification of the thoracic aorta. No aneurysmal dilatation or dissection. Evaluation of the pulmonary arteries is very limited due to suboptimal opacification and timing of the contrast as well as respiratory motion artifact. No definite large central pulmonary artery embolus identified. Mediastinum/Nodes: There is no hilar or mediastinal adenopathy. The esophagus is grossly unremarkable. No mediastinal fluid collection. Lungs/Pleura: Minimal left lung base linear atelectasis/scarring. No focal consolidation, pleural effusion, or pneumothorax. The central airways are patent. Musculoskeletal: No acute osseous pathology. Review of the MIP images  confirms the above findings. CT ABDOMEN and PELVIS FINDINGS No intra-abdominal free air or free fluid. Hepatobiliary: Diffuse fatty infiltration of the liver. No intrahepatic biliary ductal dilatation. There  is sludge or noncalcified stone in the gallbladder. No pericholecystic fluid or evidence of acute cholecystitis by CT. Pancreas: Unremarkable. No pancreatic ductal dilatation or surrounding inflammatory changes. Spleen: Normal in size without focal abnormality. Adrenals/Urinary Tract: The adrenal glands are unremarkable. There is no hydronephrosis on either side. There is symmetric enhancement and excretion of contrast by both kidneys. The visualized ureters and urinary bladder appear unremarkable. Minimal bilateral perinephric stranding, nonspecific. Correlation with urinalysis recommended to exclude UTI. Stomach/Bowel: There is sigmoid diverticulosis without active inflammatory changes. There is no bowel obstruction or active inflammation. The appendix is normal. Vascular/Lymphatic: Advanced aortoiliac atherosclerotic disease. The IVC is unremarkable. No portal venous gas. There is no adenopathy. Reproductive: Enlarged prostate gland measuring 6 cm in transverse axial diameter. The seminal vesicles are symmetric. Other: None Musculoskeletal: Degenerative changes of the spine. No acute osseous pathology. Review of the MIP images confirms the above findings. IMPRESSION: 1. No acute intrathoracic, abdominal, or pelvic pathology. No CT evidence of central pulmonary artery embolus. 2. Fatty liver. 3. Sigmoid diverticulosis. No bowel obstruction or active inflammation. Normal appendix. 4. Gallbladder sludge or noncalcified stones. Aortic Atherosclerosis (ICD10-I70.0). Electronically Signed   By: Anner Crete M.D.   On: 06/05/2019 01:54   Ct Abdomen Pelvis W Contrast  Result Date: 06/05/2019 CLINICAL DATA:  63 year old male with increased shortness of breath and fever. EXAM: CT ANGIOGRAPHY CHEST CT ABDOMEN  AND PELVIS WITH CONTRAST TECHNIQUE: Multidetector CT imaging of the chest was performed using the standard protocol during bolus administration of intravenous contrast. Multiplanar CT image reconstructions and MIPs were obtained to evaluate the vascular anatomy. Multidetector CT imaging of the abdomen and pelvis was performed using the standard protocol during bolus administration of intravenous contrast. CONTRAST:  58mL OMNIPAQUE IOHEXOL 350 MG/ML SOLN COMPARISON:  CT of the abdomen pelvis dated 05/01/2017 and chest radiograph dated 06/05/2019 FINDINGS: CTA CHEST FINDINGS Cardiovascular: There is no cardiomegaly or pericardial effusion. Coronary vascular calcification of the LAD. Mild atherosclerotic calcification of the thoracic aorta. No aneurysmal dilatation or dissection. Evaluation of the pulmonary arteries is very limited due to suboptimal opacification and timing of the contrast as well as respiratory motion artifact. No definite large central pulmonary artery embolus identified. Mediastinum/Nodes: There is no hilar or mediastinal adenopathy. The esophagus is grossly unremarkable. No mediastinal fluid collection. Lungs/Pleura: Minimal left lung base linear atelectasis/scarring. No focal consolidation, pleural effusion, or pneumothorax. The central airways are patent. Musculoskeletal: No acute osseous pathology. Review of the MIP images confirms the above findings. CT ABDOMEN and PELVIS FINDINGS No intra-abdominal free air or free fluid. Hepatobiliary: Diffuse fatty infiltration of the liver. No intrahepatic biliary ductal dilatation. There is sludge or noncalcified stone in the gallbladder. No pericholecystic fluid or evidence of acute cholecystitis by CT. Pancreas: Unremarkable. No pancreatic ductal dilatation or surrounding inflammatory changes. Spleen: Normal in size without focal abnormality. Adrenals/Urinary Tract: The adrenal glands are unremarkable. There is no hydronephrosis on either side. There is  symmetric enhancement and excretion of contrast by both kidneys. The visualized ureters and urinary bladder appear unremarkable. Minimal bilateral perinephric stranding, nonspecific. Correlation with urinalysis recommended to exclude UTI. Stomach/Bowel: There is sigmoid diverticulosis without active inflammatory changes. There is no bowel obstruction or active inflammation. The appendix is normal. Vascular/Lymphatic: Advanced aortoiliac atherosclerotic disease. The IVC is unremarkable. No portal venous gas. There is no adenopathy. Reproductive: Enlarged prostate gland measuring 6 cm in transverse axial diameter. The seminal vesicles are symmetric. Other: None Musculoskeletal: Degenerative changes of the spine. No acute osseous pathology.  Review of the MIP images confirms the above findings. IMPRESSION: 1. No acute intrathoracic, abdominal, or pelvic pathology. No CT evidence of central pulmonary artery embolus. 2. Fatty liver. 3. Sigmoid diverticulosis. No bowel obstruction or active inflammation. Normal appendix. 4. Gallbladder sludge or noncalcified stones. Aortic Atherosclerosis (ICD10-I70.0). Electronically Signed   By: Anner Crete M.D.   On: 06/05/2019 01:54   Dg Chest Portable 1 View  Result Date: 06/05/2019 CLINICAL DATA:  63 year old male with shortness of breath. EXAM: PORTABLE CHEST 1 VIEW COMPARISON:  Chest radiograph dated 10/02/2014 FINDINGS: There is blunting of the left costophrenic angle which may represent trace left pleural effusion. Left lung base densities, likely atelectatic changes. Developing infiltrate is not excluded. Clinical correlation is recommended. The right lung is clear. There is no pneumothorax. The cardiac silhouette is within normal limits. No acute osseous pathology. IMPRESSION: Probable trace left pleural effusion and left lung base atelectasis. Developing infiltrate is not excluded. Clinical correlation recommended. Electronically Signed   By: Anner Crete M.D.    On: 06/05/2019 00:17    Cardiac Studies   Echocardiogram 06/05/2019:  Impressions: 1. Left ventricular ejection fraction, by visual estimation, is 60 to 65%. The left ventricle has normal function. Normal left ventricular size. There is moderately increased left ventricular hypertrophy.  2. Elevated left ventricular end-diastolic pressure.  3. Left ventricular diastolic Doppler parameters are consistent with impaired relaxation pattern of LV diastolic filling.  4. Global right ventricle has normal systolic function.The right ventricular size is normal. No increase in right ventricular wall thickness.  5. Left atrial size was normal.  6. Right atrial size was normal.  7. Mild mitral annular calcification.  8. The mitral valve is grossly normal. Trace mitral valve regurgitation.  9. The tricuspid valve is grossly normal. Tricuspid valve regurgitation is trivial.  10. The aortic valve is grossly normal Aortic valve regurgitation was not visualized by color flow Doppler. Mild aortic valve sclerosis without stenosis.  11. The pulmonic valve was grossly normal. Pulmonic valve regurgitation is not visualized by color flow Doppler.  12. The interatrial septum was not well visualized.   Patient Profile     Mr. [name] is a 63 year old male with a history of PAD with occluded right SFA, hypertension, hyperlipidemia, type 2 diabetes on insulin with peripheral neuropathy, GERD, chronic draining pilonidal cyst, bladder cancer s/p resection in 2013, who was admitted on 06/04/2019 for sepsis secondary to UTI after presenting with shortness of breath and fever. Also found to have hypertensive urgency on presentation. Cardiology was consulted for further evaluation of elevated troponin and shortness of breath at the request of Dr. Linda Hedges.   Assessment & Plan    Dyspnea with Diastolic Dysfunction - Chest x-ray showed probable trace left pleural effusion and left lung base atelectasis.   Developing infiltrate is not excluded. Clinical correlation recommended.  - Chest CTA negative for PE but suboptimal study.  - BNP minimally elevated at 112.6.  - Echo showed LVEF of 60-65% with normal wall motion and grade 2 diastolic dysfunction with elevated LVEDP.  - Dr. Bronson Ing felt dyspnea was likely be due to hypertensive urgency as it resolved as BP improved. Does not appear significantly volume overloaded on exam today so don't think he needs IV Lasix. He is currently on IV fluids - would be cautious with this given grade 2 diastolic dysfunction.  Elevated Troponin with CAD - High-sensitivity troponin elevated at 162 >> 509 >> 741 >> 666.  - EKG showed non-specific ST/T  changes. - Myoview from 2015 showed a small inferolateral infarct with no significant ischemia.  - Chest CTA on admission showed calcification of LAD.  - Patient reports occasional brief episodes right sided chest pain that improves with his inhaler. He has a hard time describing this pain but states he feels like it is related to his lungs. Currently chest pain free. - Likely demand ischemia in setting of sepsis and hypertensive urgency. However, may benefit from additional ischemic evaluation once he recovers from acute illness.  - Continue aspirin, beta-blocker, and statin.   SVT - Patient had short run of SVT yesterday. Currently in sinus rhythm with rates in the 70's to 80's and occasional PVCs. - Potassium 3.1 and Magnesium 2.4 at that time. Potassium was repleted and is 3.8 today. - Continue Lopressor 25mg  twice daily. - Continue to monitor on telemetry.  PAD  - Known occluded right SFA. - Continue aspirin and statin. - Followed by Dr. Fletcher Anon.  Hypertension  - BP has been somewhat labile with systolic ranging from 87 to 232. Although unsure if the reading with systolic in the [id number] was accurate as next lowest reading was in the 100's. Most recent BP 158/62 but this is before morning medications. - Home  medications include: Amlodipine 2.5mg  daily, HCTZ 25mg  daily, Losartan 100mg . HCTZ and Losartan held due to renal function. - Currently on Amlodipine 5mg  daily and Lopressor 25mg  twice daily as well as PRN Labetolol.  - Continue current medications for now. Restart HCTZ and Losartan when renal function improves.  Hyperlipidemia  - LDL 48 in 04/2019.  - Continue home Lipitor 20mg  daily.   Type 2 Diabetes Mellitus  - Management per primary team.   Hypokalemia/Hypomagnesemia - Repleted and now within normal limits.  Sepsis Secondary to UTI/Pyelonephritis - WBC 20.3 today. Last fever was 103.5 last night. - Continue antibiotics. - Management per primary team.  For questions or updates, please contact [facility] Please consult [email or web address] for contact info under        Signed, Darreld Mclean, PA-C  06/06/2019, 7:32 AM    History and all data above reviewed.  Patient examined.  I agree with the findings as above.  The patient exam reveals COR:RRR  ,  Lungs: Clear, upper airway wheezing  ,  Abd: Positive bowel sounds, no rebound no guarding, Ext No edema  .  All available labs, radiology testing, previous records reviewed. Agree with documented assessment and plan.   Elevated troponin.  Not diagnostic of MI in the setting of sepsis.  Any plans for cardiac work up will likely be done as an out patient.  No evidence of ongoing acute coronary syndrome.    Jeneen Rinks [facility]  11:33 AM  06/06/2019

## 2019-06-06 NOTE — Telephone Encounter (Signed)
Dr. Jenny Reichmann is this okay with you?

## 2019-06-06 NOTE — Telephone Encounter (Signed)
Ok with me 

## 2019-06-06 NOTE — Progress Notes (Addendum)
PROGRESS NOTE    [name]  [id number]  DOB: 1955-09-05  DOA: 06/04/2019 PCP: Biagio Borg, MD  Brief Narrative:  63 year old male with a history of diabetes mellitus, hypertension,PVD,Chronic draining pilonidal cyst, transitional cell  bladder CA s/p resection in 2013, who follows Urology underwent yearly surveillenace cystoscopy on Thursday 10/22 by Dr. Karsten Ro. The following day he started to feel sick,  developed fever/ chills, loss of appetite which progressively worsened to include shortness of breath on the day of presentation to the ED. He did not have any c/o chest pain or hematuria.  ED course: BP was significantly elevated on presentation, labs revealed leucocytosis of 21 k, hypokalemia, hypomagnesemia, abnormal U/A  and troponin elevation at 162-->305 with non specific EKG changes. CT chest -ve for PE. CT abd/pelvis revealed perinephric stranding. Code sepsis was initiated in ED and patient admitted to [facility] with empiric antibiotics(Vanc-cefepime-flagyl) for complicated UTI/sepsis. He required IV hydralazine doses to control BP in the ED. Hospital course: Febrile with Tmax 103.5 on 10/25. HR 55-135. Also having diarrhea now. C.diff sent. Patient evaluated by urology and recommended to continue abx for sepsis. Seen by cardiology who felt patient's c/o shortness of breath and elevated troponin on admission likely related to uncontrolled BP and sepsis. Repeat echo mod LVH with preserved EF. Blood cx and urine cx so far no growth reported. Troponin trended 162->509->741->666  Subjective:  Patient resting comfortably when seen in rounds this morning.  Foley catheter draining clear urine.  Afebrile overnight and this morning.  Saturating 91 to 94% on 1 L O2 via nasal cannula.  Objective: Vitals:   06/06/19 0300 06/06/19 0410 06/06/19 0500 06/06/19 0600  BP: (!) 140/51 (!) 147/55 (!) 139/58 (!) 158/62  Pulse: 78 81 74 82  Resp: (!) 26 (!) 24 (!) 21 (!) 27  Temp:  98.7 F (37.1  C)    TempSrc:  Oral    SpO2: 94% 95% 93% 95%  Weight:      Height:        Intake/Output Summary (Last 24 hours) at 06/06/2019 0739 Last data filed at 06/06/2019 0600 Gross per 24 hour  Intake 3116.5 ml  Output 3325 ml  Net -208.5 ml   Filed Weights   06/05/19 0239 06/05/19 1025  Weight: 117.9 kg 118.4 kg    Physical Examination:  General exam: Appears calm and comfortable  Respiratory system: Clear to auscultation.  Decreased breath sounds at bases.  Respiratory effort normal. Cardiovascular system: S1 & S2 heard, RRR. No JVD, murmurs, rubs, gallops or clicks. No pedal edema. Gastrointestinal system: Abdomen obese, soft and nontender. No organomegaly or masses felt. Normal bowel sounds heard. Central nervous system: Alert and oriented. No focal neurological deficits. Extremities: Symmetric 5 x 5 power. Skin: No rashes, lesions or ulcers Psychiatry: Judgement and insight appear normal. Mood & affect appropriate.     Data Reviewed: I have personally reviewed following labs and imaging studies  CBC: Recent Labs  Lab 06/04/19 2318 06/04/19 2336 06/05/19 1047 06/06/19 0249  WBC 21.0*  --  22.1* 20.3*  NEUTROABS 18.0*  --  18.9*  --   HGB 14.1 13.3 13.3 12.8*  HCT 40.7 39.0 37.9* 37.9*  MCV 91.7  --  91.3 93.3  PLT 218  --  182 [id number]   Basic Metabolic Panel: Recent Labs  Lab 06/04/19 2318 06/04/19 2336 06/04/19 2348 06/05/19 1047 06/05/19 1930 06/06/19 0249  NA 136 137  --  138  --  138  K 2.5* 2.5*  2.8* 2.9* 3.1* 3.8  CL 99 98  --  104  --  107  CO2 24  --   --  21*  --  20*  GLUCOSE 104* 96  --  177*  --  117*  BUN 20 19  --  16  --  21  CREATININE 1.61* 1.60*  --  1.27*  --  1.39*  CALCIUM 8.1*  --   --  7.7*  --  7.8*  MG  --   --  1.1* 1.2* 2.4  --    GFR: Estimated Creatinine Clearance: 68.9 mL/min (A) (by C-G formula based on SCr of 1.39 mg/dL (H)). Liver Function Tests: Recent Labs  Lab 06/04/19 2318 06/05/19 1047  AST 48* 192*  ALT 28 36   ALKPHOS 64 62  BILITOT 1.5* 1.1  PROT 7.3 6.6  ALBUMIN 3.4* 3.1*   No results for input(s): LIPASE, AMYLASE in the last 168 hours. No results for input(s): AMMONIA in the last 168 hours. Coagulation Profile: Recent Labs  Lab 06/04/19 2318  INR 1.1   Cardiac Enzymes: No results for input(s): CKTOTAL, CKMB, CKMBINDEX, TROPONINI in the last 168 hours. BNP (last 3 results) No results for input(s): PROBNP in the last 8760 hours. HbA1C: No results for input(s): HGBA1C in the last 72 hours. CBG: Recent Labs  Lab 06/05/19 0754 06/05/19 1625 06/05/19 2128 06/05/19 2329  GLUCAP 63* 115* 124* 122*   Lipid Profile: No results for input(s): CHOL, HDL, LDLCALC, TRIG, CHOLHDL, LDLDIRECT in the last 72 hours. Thyroid Function Tests: No results for input(s): TSH, T4TOTAL, FREET4, T3FREE, THYROIDAB in the last 72 hours. Anemia Panel: No results for input(s): VITAMINB12, FOLATE, FERRITIN, TIBC, IRON, RETICCTPCT in the last 72 hours. Sepsis Labs: Recent Labs  Lab 06/04/19 2323 06/05/19 0318 06/05/19 1047  PROCALCITON  --   --  6.61  LATICACIDVEN 3.0* 1.5  --     Recent Results (from the past 240 hour(s))  Culture, blood (Routine x 2)     Status: None (Preliminary result)   Collection Time: 06/04/19 11:22 PM   Specimen: BLOOD  Result Value Ref Range Status   Specimen Description   Final    BLOOD RIGHT ANTECUBITAL Performed at [facility], [place] [address], [place], [place] [address]    Special Requests   Final    BOTTLES DRAWN AEROBIC AND ANAEROBIC Blood Culture adequate volume Performed at [place] [address] [place], [place] [address]    Culture   Final    NO GROWTH 1 DAY Performed at [facility], [place] [address], [place], [place] [address]    Report Status PENDING  Incomplete  Culture, blood (Routine x 2)     Status: None (Preliminary result)   Collection Time: 06/04/19 11:22 PM   Specimen: BLOOD  Result Value Ref  Range Status   Specimen Description   Final    BLOOD LEFT ANTECUBITAL Performed at [place] [address], [place], [place] [address]    Special Requests   Final    BOTTLES DRAWN AEROBIC AND ANAEROBIC Blood Culture adequate volume Performed at [place] [address], [place], [place] [address]    Culture   Final    NO GROWTH 1 DAY Performed at [facility], [place] [address] [place], [place] [address]    Report Status PENDING  Incomplete  SARS Coronavirus 2 by RT PCR (hospital order, performed in [facility] hospital  lab) Nasopharyngeal Nasopharyngeal Swab     Status: None   Collection Time: 06/05/19  1:10 AM   Specimen: Nasopharyngeal Swab  Result Value Ref Range Status   SARS Coronavirus 2 NEGATIVE NEGATIVE Final    Comment: (NOTE) If result is NEGATIVE SARS-CoV-2 target nucleic acids are NOT DETECTED. The SARS-CoV-2 RNA is generally detectable in upper and lower  respiratory specimens during the acute phase of infection. The lowest  concentration of SARS-CoV-2 viral copies this assay can detect is 250  copies / mL. A negative result does not preclude SARS-CoV-2 infection  and should not be used as the sole basis for treatment or other  patient management decisions.  A negative result may occur with  improper specimen collection / handling, submission of specimen other  than nasopharyngeal swab, presence of viral mutation(s) within the  areas targeted by this assay, and inadequate number of viral copies  (<250 copies / mL). A negative result must be combined with clinical  observations, patient history, and epidemiological information. If result is POSITIVE SARS-CoV-2 target nucleic acids are DETECTED. The SARS-CoV-2 RNA is generally detectable in upper and lower  respiratory specimens dur ing the acute phase of infection.  Positive  results are indicative of active infection with SARS-CoV-2.  Clinical  correlation with  patient history and other diagnostic information is  necessary to determine patient infection status.  Positive results do  not rule out bacterial infection or co-infection with other viruses. If result is PRESUMPTIVE POSTIVE SARS-CoV-2 nucleic acids MAY BE PRESENT.   A presumptive positive result was obtained on the submitted specimen  and confirmed on repeat testing.  While 2019 novel coronavirus  (SARS-CoV-2) nucleic acids may be present in the submitted sample  additional confirmatory testing may be necessary for epidemiological  and / or clinical management purposes  to differentiate between  SARS-CoV-2 and other Sarbecovirus currently known to infect humans.  If clinically indicated additional testing with an alternate test  methodology [phone]) is advised. The SARS-CoV-2 RNA is generally  detectable in upper and lower respiratory sp ecimens during the acute  phase of infection. The expected result is Negative. Fact Sheet for Patients:  StrictlyIdeas.no Fact Sheet for Healthcare Providers: BankingDealers.co.za This test is not yet approved or cleared by the [place] FDA and has been authorized for detection and/or diagnosis of SARS-CoV-2 by FDA under an Emergency Use Authorization (EUA).  This EUA will remain in effect (meaning this test can be used) for the duration of the COVID-19 declaration under Section 564(b)(1) of the Act, 21 U.S.C. section 360bbb-3(b)(1), unless the authorization is terminated or revoked sooner. Performed at [facility], [place] [address], [place], [place] [address]   MRSA PCR Screening     Status: None   Collection Time: 06/05/19 10:33 AM   Specimen: Nasal Mucosa; Nasopharyngeal  Result Value Ref Range Status   MRSA by PCR NEGATIVE NEGATIVE Final    Comment:        The GeneXpert MRSA Assay (FDA approved for NASAL specimens only), is one component of a comprehensive MRSA  colonization surveillance program. It is not intended to diagnose MRSA infection nor to guide or monitor treatment for MRSA infections. Performed at [facility], [place] [address] [place], [place] [address]   C difficile quick scan w PCR reflex     Status: None   Collection Time: 06/05/19  4:26 PM   Specimen: STOOL  Result Value Ref Range Status   C Diff antigen NEGATIVE  NEGATIVE Final   C Diff toxin NEGATIVE NEGATIVE Final   C Diff interpretation No C. difficile detected.  Final    Comment: Performed at [facility], [place] [address] [place], [place] [address]      Radiology Studies: Ct Angio Chest Pe W And/or Wo Contrast  Result Date: 06/05/2019 CLINICAL DATA:  63 year old male with increased shortness of breath and fever. EXAM: CT ANGIOGRAPHY CHEST CT ABDOMEN AND PELVIS WITH CONTRAST TECHNIQUE: Multidetector CT imaging of the chest was performed using the standard protocol during bolus administration of intravenous contrast. Multiplanar CT image reconstructions and MIPs were obtained to evaluate the vascular anatomy. Multidetector CT imaging of the abdomen and pelvis was performed using the standard protocol during bolus administration of intravenous contrast. CONTRAST:  34mL OMNIPAQUE IOHEXOL 350 MG/ML SOLN COMPARISON:  CT of the abdomen pelvis dated 05/01/2017 and chest radiograph dated 06/05/2019 FINDINGS: CTA CHEST FINDINGS Cardiovascular: There is no cardiomegaly or pericardial effusion. Coronary vascular calcification of the LAD. Mild atherosclerotic calcification of the thoracic aorta. No aneurysmal dilatation or dissection. Evaluation of the pulmonary arteries is very limited due to suboptimal opacification and timing of the contrast as well as respiratory motion artifact. No definite large central pulmonary artery embolus identified. Mediastinum/Nodes: There is no hilar or mediastinal adenopathy. The esophagus is grossly unremarkable. No  mediastinal fluid collection. Lungs/Pleura: Minimal left lung base linear atelectasis/scarring. No focal consolidation, pleural effusion, or pneumothorax. The central airways are patent. Musculoskeletal: No acute osseous pathology. Review of the MIP images confirms the above findings. CT ABDOMEN and PELVIS FINDINGS No intra-abdominal free air or free fluid. Hepatobiliary: Diffuse fatty infiltration of the liver. No intrahepatic biliary ductal dilatation. There is sludge or noncalcified stone in the gallbladder. No pericholecystic fluid or evidence of acute cholecystitis by CT. Pancreas: Unremarkable. No pancreatic ductal dilatation or surrounding inflammatory changes. Spleen: Normal in size without focal abnormality. Adrenals/Urinary Tract: The adrenal glands are unremarkable. There is no hydronephrosis on either side. There is symmetric enhancement and excretion of contrast by both kidneys. The visualized ureters and urinary bladder appear unremarkable. Minimal bilateral perinephric stranding, nonspecific. Correlation with urinalysis recommended to exclude UTI. Stomach/Bowel: There is sigmoid diverticulosis without active inflammatory changes. There is no bowel obstruction or active inflammation. The appendix is normal. Vascular/Lymphatic: Advanced aortoiliac atherosclerotic disease. The IVC is unremarkable. No portal venous gas. There is no adenopathy. Reproductive: Enlarged prostate gland measuring 6 cm in transverse axial diameter. The seminal vesicles are symmetric. Other: None Musculoskeletal: Degenerative changes of the spine. No acute osseous pathology. Review of the MIP images confirms the above findings. IMPRESSION: 1. No acute intrathoracic, abdominal, or pelvic pathology. No CT evidence of central pulmonary artery embolus. 2. Fatty liver. 3. Sigmoid diverticulosis. No bowel obstruction or active inflammation. Normal appendix. 4. Gallbladder sludge or noncalcified stones. Aortic Atherosclerosis  (ICD10-I70.0). Electronically Signed   By: Anner Crete M.D.   On: 06/05/2019 01:54   Ct Abdomen Pelvis W Contrast  Result Date: 06/05/2019 CLINICAL DATA:  63 year old male with increased shortness of breath and fever. EXAM: CT ANGIOGRAPHY CHEST CT ABDOMEN AND PELVIS WITH CONTRAST TECHNIQUE: Multidetector CT imaging of the chest was performed using the standard protocol during bolus administration of intravenous contrast. Multiplanar CT image reconstructions and MIPs were obtained to evaluate the vascular anatomy. Multidetector CT imaging of the abdomen and pelvis was performed using the standard protocol during bolus administration of intravenous contrast. CONTRAST:  40mL OMNIPAQUE IOHEXOL 350 MG/ML SOLN COMPARISON:  CT of the abdomen pelvis  dated 05/01/2017 and chest radiograph dated 06/05/2019 FINDINGS: CTA CHEST FINDINGS Cardiovascular: There is no cardiomegaly or pericardial effusion. Coronary vascular calcification of the LAD. Mild atherosclerotic calcification of the thoracic aorta. No aneurysmal dilatation or dissection. Evaluation of the pulmonary arteries is very limited due to suboptimal opacification and timing of the contrast as well as respiratory motion artifact. No definite large central pulmonary artery embolus identified. Mediastinum/Nodes: There is no hilar or mediastinal adenopathy. The esophagus is grossly unremarkable. No mediastinal fluid collection. Lungs/Pleura: Minimal left lung base linear atelectasis/scarring. No focal consolidation, pleural effusion, or pneumothorax. The central airways are patent. Musculoskeletal: No acute osseous pathology. Review of the MIP images confirms the above findings. CT ABDOMEN and PELVIS FINDINGS No intra-abdominal free air or free fluid. Hepatobiliary: Diffuse fatty infiltration of the liver. No intrahepatic biliary ductal dilatation. There is sludge or noncalcified stone in the gallbladder. No pericholecystic fluid or evidence of acute  cholecystitis by CT. Pancreas: Unremarkable. No pancreatic ductal dilatation or surrounding inflammatory changes. Spleen: Normal in size without focal abnormality. Adrenals/Urinary Tract: The adrenal glands are unremarkable. There is no hydronephrosis on either side. There is symmetric enhancement and excretion of contrast by both kidneys. The visualized ureters and urinary bladder appear unremarkable. Minimal bilateral perinephric stranding, nonspecific. Correlation with urinalysis recommended to exclude UTI. Stomach/Bowel: There is sigmoid diverticulosis without active inflammatory changes. There is no bowel obstruction or active inflammation. The appendix is normal. Vascular/Lymphatic: Advanced aortoiliac atherosclerotic disease. The IVC is unremarkable. No portal venous gas. There is no adenopathy. Reproductive: Enlarged prostate gland measuring 6 cm in transverse axial diameter. The seminal vesicles are symmetric. Other: None Musculoskeletal: Degenerative changes of the spine. No acute osseous pathology. Review of the MIP images confirms the above findings. IMPRESSION: 1. No acute intrathoracic, abdominal, or pelvic pathology. No CT evidence of central pulmonary artery embolus. 2. Fatty liver. 3. Sigmoid diverticulosis. No bowel obstruction or active inflammation. Normal appendix. 4. Gallbladder sludge or noncalcified stones. Aortic Atherosclerosis (ICD10-I70.0). Electronically Signed   By: Anner Crete M.D.   On: 06/05/2019 01:54   Dg Chest Portable 1 View  Result Date: 06/05/2019 CLINICAL DATA:  63 year old male with shortness of breath. EXAM: PORTABLE CHEST 1 VIEW COMPARISON:  Chest radiograph dated 10/02/2014 FINDINGS: There is blunting of the left costophrenic angle which may represent trace left pleural effusion. Left lung base densities, likely atelectatic changes. Developing infiltrate is not excluded. Clinical correlation is recommended. The right lung is clear. There is no pneumothorax. The  cardiac silhouette is within normal limits. No acute osseous pathology. IMPRESSION: Probable trace left pleural effusion and left lung base atelectasis. Developing infiltrate is not excluded. Clinical correlation recommended. Electronically Signed   By: Anner Crete M.D.   On: 06/05/2019 00:17        Scheduled Meds: . amLODipine  5 mg Oral Daily  . aspirin EC  81 mg Oral Daily  . Chlorhexidine Gluconate Cloth  6 each Topical Daily  . enoxaparin (LOVENOX) injection  40 mg Subcutaneous Q24H  . insulin aspart  0-20 Units Subcutaneous TID WC  . insulin glargine  50 Units Subcutaneous BID  . loratadine  10 mg Oral Daily  . metoprolol tartrate  25 mg Oral BID  . pantoprazole  40 mg Oral Daily  . potassium chloride SA  20 mEq Oral Daily   Continuous Infusions: . sodium chloride 125 mL/hr at 06/06/19 0544  . ceFEPime (MAXIPIME) IV Stopped (06/06/19 [id number])  . metronidazole Stopped (06/06/19 0143)  . vancomycin Stopped (06/06/19  0512)    Assessment & Plan:   1. Sepsis/pyelonephritis : Remains on empiric abx/IV fluids.  Urine culture growing greater than 50,000 CFU of gram-negative rods.  ID and sensitivity pending.  Blood culture so far -ve. White count persistent at 21-22 k and concerning. Tmax 103.5. yesterday afternoon  2. HTNsive Urgency: Improved, SBP fluctuating now 120s to 150s.Cardiology recommended continue home meds, prn hydralazine.  3. Troponemia :secondary to #2 likely. Echo shows mod LVH, EF 60-65%.   4. Severe Hypokalemia/hypomagnesemia:Replaced IV and oral mag/potassium. Not on any diuretics. D/C IV fluids to avoid dilutional effect and BP elevation. Of note, patient having diarrhea and also on large doses of insulin at home that can contribute to hypokalemia  5. Diabetes Mellitus: On large doses of insulin at home (250 U). Reduced to 50 U now in concern for hypoglycemia. Likely diet non compliant at home.  He says he was following Dr. Mena Goes as outpatient for  endocrinology and Dr. Jenny Reichmann for primary care with [facility].  He however states he has not seen endocrinology for over a year now.  6. PVD: on asa/statins  7. Diarrhea: Stool C.diff -ve.  GI panel results pending. Loperamide prn.  Advance diet.  8. Dyspnea: O2 sats 90-97%. CT chest -ve for PE. On aggressive IV hydration.  DC IV fluids.  Patient does have obese abdomen and likely has restrictive lung disease/atelectasis due to poor expansion.  Advised incentive spirometry as noted to improve his sats on taking deep breaths during bedside exam.  9.  Bladder ca: s/p resection. No abnormal findings on recent surveillance cystoscopy.    DVT prophylaxis: Lovenox Code Status: Full code Family / Patient Communication: Discussed with patient and all questions answered to satisfaction. Disposition Plan: TBD.  Transfer out of stepdown unit as appears to be improving hemodynamically.     LOS: 1 day    Time spent:     Guilford Shi, MD [facility] Pager [phone]  If 7PM-7AM, please contact night-coverage [email or web address] Password [facility] 06/06/2019, 7:39 AM

## 2019-06-06 NOTE — Progress Notes (Signed)
Assessment: UTI:  His urine culture has grown 50K gram-negative rods.  The sensitivities remain pending.  He is on broad-spectrum antibiotics and has remained afebrile over the past 24 hours.  He denies any flank pain to suggest pyelonephritis.  His blood cultures are negative.  He will need to be placed on oral antibiotics upon discharge for a total of 2 weeks of antibiotic therapy.   Plan:  1. Continue current antibiotic therapy. 2.  Adjust according to sensitivities. 3.  Antibiotics upon discharge for a total of 2 weeks of therapy based on sensitivities.   Subjective: Patient reports no complaints other than slight discomfort from the Foley catheter.  No flank pain.  Objective: Vital signs in last 24 hours: Temp:  [97.6 F (36.4 C)-98.7 F (37.1 C)] 97.6 F (36.4 C) (10/26 1600) Pulse Rate:  [71-99] 78 (10/26 1700) Resp:  [16-29] 23 (10/26 1700) BP: (117-164)/(40-92) 151/72 (10/26 1700) SpO2:  [90 %-97 %] 96 % (10/26 1700)A  Intake/Output from previous day: 10/25 0701 - 10/26 0700 In: 3116.5 [P.O.:600; I.V.:918.7; IV Piggyback:1597.9] Out: 3325 [id number] Intake/Output this shift: No intake/output data recorded.  Past Medical History:  Diagnosis Date  . Allergic rhinitis   . At risk for sleep apnea    STOP-BANG= 5   SENT TO PCP 03-14-2014  . Cataract    surgically removed bilateral  . Condyloma acuminatum of penis   . Diabetic neuropathy ([place])   . GERD (gastroesophageal reflux disease)   . History of bladder cancer    s/p  turbt  2013/   transitional cell carcinoma--   . History of condyloma acuminatum    PERINEAL AREA  W/ RECURRENCY  . History of gout   . Hyperlipidemia   . Hypertension   . Lower urinary tract symptoms (LUTS)   . Productive cough   . PVD (peripheral vascular disease) with claudication (HCC)    bilateral SFA disease-- right > left  and left tibial artery disease--  per duplex  . Smokers' cough ([place])   . Type 2 diabetes mellitus with  insulin therapy ([place])    monitor by  dr ellsion  . Wears dentures    upper    Physical Exam:  General: Awake, alert and in no apparent distress Lungs: Normal respiratory effort, chest expands symmetrically.  Abdomen: Soft, non-tender & non-distended. GU:  Foley catheter indwelling with clear urine draining.  Lab Results: Recent Labs    06/04/19 2318 06/04/19 2336 06/05/19 1047 06/06/19 0249  WBC 21.0*  --  22.1* 20.3*  HGB 14.1 13.3 13.3 12.8*  HCT 40.7 39.0 37.9* 37.9*   BMET Recent Labs    06/05/19 1047 06/05/19 1930 06/06/19 0249  NA 138  --  138  K 2.9* 3.1* 3.8  CL 104  --  107  CO2 21*  --  20*  GLUCOSE 177*  --  117*  BUN 16  --  21  CREATININE 1.27*  --  1.39*  CALCIUM 7.7*  --  7.8*   No results for input(s): LABURIN in the last 72 hours. Results for orders placed or performed during the hospital encounter of 06/04/19  Culture, blood (Routine x 2)     Status: None (Preliminary result)   Collection Time: 06/04/19 11:22 PM   Specimen: BLOOD  Result Value Ref Range Status   Specimen Description   Final    BLOOD RIGHT ANTECUBITAL Performed at [facility] [address], [place], [place] [address]    Special  Requests   Final    BOTTLES DRAWN AEROBIC AND ANAEROBIC Blood Culture adequate volume Performed at [facility] [address] [place] [address], [place], [place] [address]    Culture   Final    NO GROWTH 1 DAY Performed at [facility], [place] [address], [place], [place] [address]    Report Status PENDING  Incomplete  Culture, blood (Routine x 2)     Status: None (Preliminary result)   Collection Time: 06/04/19 11:22 PM   Specimen: BLOOD  Result Value Ref Range Status   Specimen Description   Final    BLOOD LEFT ANTECUBITAL Performed at [facility] [address], [place], [place] [address]    Special Requests   Final    BOTTLES DRAWN AEROBIC AND ANAEROBIC Blood Culture adequate  volume Performed at [facility] [address], [place], [place] [address]    Culture   Final    NO GROWTH 1 DAY Performed at [facility], [place] [address] [place], [place] [address]    Report Status PENDING  Incomplete  Urine culture     Status: Abnormal (Preliminary result)   Collection Time: 06/05/19 12:41 AM   Specimen: In/Out Cath Urine  Result Value Ref Range Status   Specimen Description   Final    IN/OUT CATH URINE Performed at [facility] [address], [place], [place] [address]    Special Requests   Final    NONE Performed at [facility], [place] [address] [place], [place] [address]    Culture (A)  Final    50,000 COLONIES/mL GRAM NEGATIVE RODS IDENTIFICATION AND SUSCEPTIBILITIES TO FOLLOW Performed at [facility], [place] [address], [place], [place] [address]    Report Status PENDING  Incomplete  SARS Coronavirus 2 by RT PCR (hospital order, performed in [facility] hospital lab) Nasopharyngeal Nasopharyngeal Swab     Status: None   Collection Time: 06/05/19  1:10 AM   Specimen: Nasopharyngeal Swab  Result Value Ref Range Status   SARS Coronavirus 2 NEGATIVE NEGATIVE Final    Comment: (NOTE) If result is NEGATIVE SARS-CoV-2 target nucleic acids are NOT DETECTED. The SARS-CoV-2 RNA is generally detectable in upper and lower  respiratory specimens during the acute phase of infection. The lowest  concentration of SARS-CoV-2 viral copies this assay can detect is 250  copies / mL. A negative result does not preclude SARS-CoV-2 infection  and should not be used as the sole basis for treatment or other  patient management decisions.  A negative result may occur with  improper specimen collection / handling, submission of specimen other  than nasopharyngeal swab, presence of viral mutation(s) within the  areas targeted by this assay, and inadequate number of viral copies  (<250 copies / mL). A  negative result must be combined with clinical  observations, patient history, and epidemiological information. If result is POSITIVE SARS-CoV-2 target nucleic acids are DETECTED. The SARS-CoV-2 RNA is generally detectable in upper and lower  respiratory specimens dur ing the acute phase of infection.  Positive  results are indicative of active infection with SARS-CoV-2.  Clinical  correlation with patient history and other diagnostic information is  necessary to determine patient infection status.  Positive results do  not rule out bacterial infection or co-infection with other viruses. If result is PRESUMPTIVE POSTIVE SARS-CoV-2 nucleic acids MAY BE PRESENT.   A presumptive positive result was obtained on the submitted specimen  and confirmed on repeat testing.  While 2019 novel coronavirus  (SARS-CoV-2) nucleic acids may be present in the submitted sample  additional confirmatory testing may be necessary for epidemiological  and / or clinical management purposes  to differentiate between  SARS-CoV-2 and other Sarbecovirus currently known to infect humans.  If clinically indicated additional testing with an alternate test  methodology [phone]) is advised. The SARS-CoV-2 RNA is generally  detectable in upper and lower respiratory sp ecimens during the acute  phase of infection. The expected result is Negative. Fact Sheet for Patients:  StrictlyIdeas.no Fact Sheet for Healthcare Providers: BankingDealers.co.za This test is not yet approved or cleared by the [place] FDA and has been authorized for detection and/or diagnosis of SARS-CoV-2 by FDA under an Emergency Use Authorization (EUA).  This EUA will remain in effect (meaning this test can be used) for the duration of the COVID-19 declaration under Section 564(b)(1) of the Act, 21 U.S.C. section 360bbb-3(b)(1), unless the authorization is terminated or revoked sooner. Performed  at [facility], [place] [address] [place], [place] [address]   MRSA PCR Screening     Status: None   Collection Time: 06/05/19 10:33 AM   Specimen: Nasal Mucosa; Nasopharyngeal  Result Value Ref Range Status   MRSA by PCR NEGATIVE NEGATIVE Final    Comment:        The GeneXpert MRSA Assay (FDA approved for NASAL specimens only), is one component of a comprehensive MRSA colonization surveillance program. It is not intended to diagnose MRSA infection nor to guide or monitor treatment for MRSA infections. Performed at [facility], [place] [address] [place], [place] [address]   C difficile quick scan w PCR reflex     Status: None   Collection Time: 06/05/19  4:26 PM   Specimen: STOOL  Result Value Ref Range Status   C Diff antigen NEGATIVE NEGATIVE Final   C Diff toxin NEGATIVE NEGATIVE Final   C Diff interpretation No C. difficile detected.  Final    Comment: Performed at [facility], [place] [address], [place],  [address]    Studies/Results: No results found.    Claybon Jabs 06/06/2019, 7:34 PM

## 2019-06-06 NOTE — Telephone Encounter (Signed)
Patients wife called in to inform Dr Jenny Reichmann that [name] will be faxing over paperwork for wife to stay out of work to be with husband who is currently admitted in [facility]  Please advise

## 2019-06-06 NOTE — Progress Notes (Signed)
Inpatient Diabetes Program Recommendations  AACE/ADA: New Consensus Statement on Inpatient Glycemic Control (2015)  Target Ranges:  Prepandial:   less than 140 mg/dL      Peak postprandial:   less than 180 mg/dL (1-2 hours)      Critically ill patients:  140 - 180 mg/dL   Lab Results  Component Value Date   GLUCAP 254 (H) 06/06/2019   HGBA1C 8.7 (H) 04/21/2019    Review of Glycemic Control  Spoke with pt regarding his insulin dose of Lantus. Pt states he takes 250 units (just went from 240 to 250 units) of Lantus QAM. Sees Endo Dr. Loanne Drilling. Pt states it's too expensive for him although he just received another shipment from his insurance. 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 Spoke with him regarding insulins that are not as expensive as Lantus, and he said we needed to wait until after the election to find out more about prices. Discussed HgbA1C of 8.7%. States this has come down from 10%.   When diet is advanced, will need meal coverage insulin.  Will follow closely.  Thank you. Lorenda Peck, RD, LDN, CDE Inpatient Diabetes Coordinator [phone]

## 2019-06-07 DIAGNOSIS — I10 Essential (primary) hypertension: Secondary | ICD-10-CM | POA: Diagnosis not present

## 2019-06-07 DIAGNOSIS — I471 Supraventricular tachycardia: Secondary | ICD-10-CM

## 2019-06-07 DIAGNOSIS — R197 Diarrhea, unspecified: Secondary | ICD-10-CM | POA: Diagnosis not present

## 2019-06-07 DIAGNOSIS — A419 Sepsis, unspecified organism: Secondary | ICD-10-CM | POA: Diagnosis not present

## 2019-06-07 DIAGNOSIS — E0800 Diabetes mellitus due to underlying condition with hyperosmolarity without nonketotic hyperglycemic-hyperosmolar coma (NKHHC): Secondary | ICD-10-CM | POA: Diagnosis not present

## 2019-06-07 LAB — CBC
HCT: 37.5 % — ABNORMAL LOW (ref 39.0–52.0)
Hemoglobin: 12.7 g/dL — ABNORMAL LOW (ref 13.0–17.0)
MCH: 31.7 pg (ref 26.0–34.0)
MCHC: 33.9 g/dL (ref 30.0–36.0)
MCV: 93.5 fL (ref 80.0–100.0)
Platelets: 181 10*3/uL (ref 150–400)
RBC: 4.01 MIL/uL — ABNORMAL LOW (ref 4.22–5.81)
RDW: 13 % (ref 11.5–15.5)
WBC: 12.6 10*3/uL — ABNORMAL HIGH (ref 4.0–10.5)
nRBC: 0 % (ref 0.0–0.2)

## 2019-06-07 LAB — CREATININE, SERUM
Creatinine, Ser: 1.28 mg/dL — ABNORMAL HIGH (ref 0.61–1.24)
GFR calc Af Amer: >60 mL/min (ref >60)
GFR calc non Af Amer: 60 mL/min — ABNORMAL LOW (ref >60)

## 2019-06-07 LAB — GLUCOSE, CAPILLARY
Glucose-Capillary: 123 mg/dL — ABNORMAL HIGH (ref 70–99)
Glucose-Capillary: 218 mg/dL — ABNORMAL HIGH (ref 70–99)
Glucose-Capillary: 221 mg/dL — ABNORMAL HIGH (ref 70–99)
Glucose-Capillary: 274 mg/dL — ABNORMAL HIGH (ref 70–99)
Glucose-Capillary: 323 mg/dL — ABNORMAL HIGH (ref 70–99)

## 2019-06-07 MED ORDER — METOPROLOL TARTRATE 25 MG PO TABS
50.0000 mg | ORAL_TABLET | Freq: Two times a day (BID) | ORAL | Status: DC
  Administered 2019-06-07: 11:00:00 50 mg via ORAL
  Filled 2019-06-07: qty 2

## 2019-06-07 MED ORDER — MIRABEGRON ER 25 MG PO TB24
25.0000 mg | ORAL_TABLET | Freq: Every day | ORAL | Status: DC
  Administered 2019-06-07: 18:00:00 25 mg via ORAL
  Filled 2019-06-07: qty 1

## 2019-06-07 MED ORDER — METOPROLOL TARTRATE 25 MG PO TABS
75.0000 mg | ORAL_TABLET | Freq: Two times a day (BID) | ORAL | Status: DC
  Administered 2019-06-07: 75 mg via ORAL
  Filled 2019-06-07: qty 3

## 2019-06-07 NOTE — Telephone Encounter (Signed)
Will be completed once fax is received.

## 2019-06-07 NOTE — Progress Notes (Signed)
Assessment:  UTI:  His urine culture remains pending at this time.  It initially showed gram-negative bacteria but now just shows pending.  I do not know the significance of this change but the patient seems to be doing better overall.  His white blood cell count has decreased.  He has continued to remain afebrile.  His urine remains clear and he is having some irritation from the catheter in the form of what sounds like mild bladder spasms by history.  Plan:  1.  Continue antibiotics. 2.  Patient is not ambulatory at therefore would continue Foley catheter for least another 24 hours. 3. Will add Myrbetriq for what sounds like mild bladder spasms.   Subjective: Patient reports experiencing a sensation of needing to urinate despite having a Foley catheter in place.  He also is having intermittent mild suprapubic discomfort.  No flank pain noted.  Objective: Vital signs in last 24 hours: Temp:  [98.3 F (36.8 C)-98.8 F (37.1 C)] 98.3 F (36.8 C) (10/27 1200) Pulse Rate:  [70-88] 70 (10/27 1302) Resp:  [15-26] 15 (10/27 1302) BP: (130-167)/(49-100) 157/61 (10/27 1302) SpO2:  [94 %-99 %] 97 % (10/27 1302)A  Intake/Output from previous day: 10/26 0701 - 10/27 0700 In: 1103.8 [I.V.:692.4; IV Piggyback:411.4] Out: 2575 [Urine:2575] Intake/Output this shift: Total I/O In: 732 [IV Piggyback:732] Out: 750 [Urine:750]  Past Medical History:  Diagnosis Date  . Allergic rhinitis   . At risk for sleep apnea    STOP-BANG= 5   SENT TO PCP 03-14-2014  . Cataract    surgically removed bilateral  . Condyloma acuminatum of penis   . Diabetic neuropathy ([place])   . GERD (gastroesophageal reflux disease)   . History of bladder cancer    s/p  turbt  2013/   transitional cell carcinoma--   . History of condyloma acuminatum    PERINEAL AREA  W/ RECURRENCY  . History of gout   . Hyperlipidemia   . Hypertension   . Lower urinary tract symptoms (LUTS)   . Productive cough   . PVD  (peripheral vascular disease) with claudication (HCC)    bilateral SFA disease-- right > left  and left tibial artery disease--  per duplex  . Smokers' cough ([place])   . Type 2 diabetes mellitus with insulin therapy ([place])    monitor by  dr ellsion  . Wears dentures    upper    Physical Exam:  General: Awake, alert and in no apparent distress Lungs: Normal respiratory effort, chest expands symmetrically.  Abdomen: Soft, non-tender & non-distended. Foley: Urine clear  Lab Results: Recent Labs    06/05/19 1047 06/06/19 0249 06/07/19 0929  WBC 22.1* 20.3* 12.6*  HGB 13.3 12.8* 12.7*  HCT 37.9* 37.9* 37.5*   BMET Recent Labs    06/05/19 1047 06/05/19 1930 06/06/19 0249 06/07/19 0158  NA 138  --  138  --   K 2.9* 3.1* 3.8  --   CL 104  --  107  --   CO2 21*  --  20*  --   GLUCOSE 177*  --  117*  --   BUN 16  --  21  --   CREATININE 1.27*  --  1.39* 1.28*  CALCIUM 7.7*  --  7.8*  --    No results for input(s): LABURIN in the last 72 hours. Results for orders placed or performed during the hospital encounter of 06/04/19  Culture, blood (Routine x 2)     Status: None (  Preliminary result)   Collection Time: 06/04/19 11:22 PM   Specimen: BLOOD  Result Value Ref Range Status   Specimen Description   Final    BLOOD RIGHT ANTECUBITAL Performed at [facility] [address] [place], [place] [address]    Special Requests   Final    BOTTLES DRAWN AEROBIC AND ANAEROBIC Blood Culture adequate volume Performed at [facility] [address], [place], [place] [address]    Culture   Final    NO GROWTH 2 DAYS Performed at [facility] [address] [place], [place] [address]    Report Status PENDING  Incomplete  Culture, blood (Routine x 2)     Status: None (Preliminary result)   Collection Time: 06/04/19 11:22 PM   Specimen: BLOOD  Result Value Ref Range Status   Specimen Description   Final    BLOOD LEFT  ANTECUBITAL Performed at [facility] [address] [place], [place] [address]    Special Requests   Final    BOTTLES DRAWN AEROBIC AND ANAEROBIC Blood Culture adequate volume Performed at [facility] [address] [place], [place] [address]    Culture   Final    NO GROWTH 2 DAYS Performed at [facility] [address], [place],  [address]    Report Status PENDING  Incomplete  Urine culture     Status: None (Preliminary result)   Collection Time: 06/05/19 12:41 AM   Specimen: In/Out Cath Urine  Result Value Ref Range Status   Specimen Description IN/OUT CATH URINE  Final   Special Requests   Final    NONE Performed at [facility] [address], [place],  [address]    Report Status PENDING  Incomplete  SARS Coronavirus 2 by RT PCR (hospital order, performed in [facility] hospital lab) Nasopharyngeal Nasopharyngeal Swab     Status: None   Collection Time: 06/05/19  1:10 AM   Specimen: Nasopharyngeal Swab  Result Value Ref Range Status   SARS Coronavirus 2 NEGATIVE NEGATIVE Final    Comment: (NOTE) If result is NEGATIVE SARS-CoV-2 target nucleic acids are NOT DETECTED. The SARS-CoV-2 RNA is generally detectable in upper and lower  respiratory specimens during the acute phase of infection. The lowest  concentration of SARS-CoV-2 viral copies this assay can detect is 250  copies / mL. A negative result does not preclude SARS-CoV-2 infection  and should not be used as the sole basis for treatment or other  patient management decisions.  A negative result may occur with  improper specimen collection / handling, submission of specimen other  than nasopharyngeal swab, presence of viral mutation(s) within the  areas targeted by this assay, and inadequate number of viral copies  (<250 copies / mL). A negative result must be combined with clinical  observations, patient history, and epidemiological  information. If result is POSITIVE SARS-CoV-2 target nucleic acids are DETECTED. The SARS-CoV-2 RNA is generally detectable in upper and lower  respiratory specimens dur ing the acute phase of infection.  Positive  results are indicative of active infection with SARS-CoV-2.  Clinical  correlation with patient history and other diagnostic information is  necessary to determine patient infection status.  Positive results do  not rule out bacterial infection or co-infection with other viruses. If result is PRESUMPTIVE POSTIVE SARS-CoV-2 nucleic acids MAY BE PRESENT.   A presumptive positive result was obtained on the submitted specimen  and  confirmed on repeat testing.  While 2019 novel coronavirus  (SARS-CoV-2) nucleic acids may be present in the submitted sample  additional confirmatory testing may be necessary for epidemiological  and / or clinical management purposes  to differentiate between  SARS-CoV-2 and other Sarbecovirus currently known to infect humans.  If clinically indicated additional testing with an alternate test  methodology [phone]) is advised. The SARS-CoV-2 RNA is generally  detectable in upper and lower respiratory sp ecimens during the acute  phase of infection. The expected result is Negative. Fact Sheet for Patients:  StrictlyIdeas.no Fact Sheet for Healthcare Providers: BankingDealers.co.za This test is not yet approved or cleared by the [place] FDA and has been authorized for detection and/or diagnosis of SARS-CoV-2 by FDA under an Emergency Use Authorization (EUA).  This EUA will remain in effect (meaning this test can be used) for the duration of the COVID-19 declaration under Section 564(b)(1) of the Act, 21 U.S.C. section 360bbb-3(b)(1), unless the authorization is terminated or revoked sooner. Performed at [facility], [place] [address] [place], [place] [address]   MRSA PCR  Screening     Status: None   Collection Time: 06/05/19 10:33 AM   Specimen: Nasal Mucosa; Nasopharyngeal  Result Value Ref Range Status   MRSA by PCR NEGATIVE NEGATIVE Final    Comment:        The GeneXpert MRSA Assay (FDA approved for NASAL specimens only), is one component of a comprehensive MRSA colonization surveillance program. It is not intended to diagnose MRSA infection nor to guide or monitor treatment for MRSA infections. Performed at [facility], [place] [address], [place], [place] [address]   C difficile quick scan w PCR reflex     Status: None   Collection Time: 06/05/19  4:26 PM   Specimen: STOOL  Result Value Ref Range Status   C Diff antigen NEGATIVE NEGATIVE Final   C Diff toxin NEGATIVE NEGATIVE Final   C Diff interpretation No C. difficile detected.  Final    Comment: Performed at [facility], [place] [address], [place], [place] [address]    Studies/Results: No results found.    Thana Farr Helyn Schwan 06/07/2019, 5:10 PM

## 2019-06-07 NOTE — Progress Notes (Addendum)
Progress Note  Patient Name: [name] Date of Encounter: 06/07/2019  Primary Cardiologist: Kathlyn Sacramento, MD   Subjective   No chest pain, aware of tachycardia at times.  No SOB  Inpatient Medications    Scheduled Meds: . aspirin EC  81 mg Oral Daily  . Chlorhexidine Gluconate Cloth  6 each Topical Daily  . enoxaparin (LOVENOX) injection  40 mg Subcutaneous Q24H  . insulin aspart  0-20 Units Subcutaneous TID WC  . insulin glargine  50 Units Subcutaneous BID  . loratadine  10 mg Oral Daily  . metoprolol tartrate  50 mg Oral BID  . pantoprazole  40 mg Oral Daily  . potassium chloride SA  20 mEq Oral Daily   Continuous Infusions: . ceFEPime (MAXIPIME) IV 2 g (06/07/19 0522)  . vancomycin Stopped (06/07/19 0520)   PRN Meds: acetaminophen, labetalol, loperamide, ondansetron (ZOFRAN) IV   Vital Signs    Vitals:   06/07/19 0005 06/07/19 0208 06/07/19 0400 06/07/19 0800  BP: (!) 164/65 (!) 167/72 138/62   Pulse: 79 78 77   Resp: 19 16 (!) 25   Temp: 98.3 F (36.8 C)  98.8 F (37.1 C) 98.4 F (36.9 C)  TempSrc: Oral  Oral Oral  SpO2: 96% 95% 94%   Weight:      Height:        Intake/Output Summary (Last 24 hours) at 06/07/2019 0952 Last data filed at 06/07/2019 0517 Gross per 24 hour  Intake 588.47 ml  Output 2225 ml  Net -1636.53 ml   Last 3 Weights 06/05/2019 06/05/2019 04/21/2019  Weight (lbs) 261 lb 0.4 oz 260 lb 258 lb  Weight (kg) 118.4 kg 117.935 kg 117.028 kg      Telemetry    SR with bursts of possible PAF, rates up to 130s - Personally Reviewed  ECG    No new  - Personally Reviewed  Physical Exam   GEN: No acute distress.   Neck: No JVD Cardiac: RRR, no murmurs, rubs, or gallops.  Respiratory: Clear to diminished to auscultation bilaterally. GI: Soft, nontender, non-distended  MS: No edema; No deformity. Neuro:  Nonfocal  Psych: Normal affect   Labs    High Sensitivity Troponin:   Recent Labs  Lab 06/04/19 2318 06/05/19 0318  06/05/19 1047 06/05/19 1240  TROPONINIHS 162* 509* 741* 666*      Chemistry Recent Labs  Lab 06/04/19 2318 06/04/19 2336  06/05/19 1047 06/05/19 1930 06/06/19 0249 06/07/19 0158  NA 136 137  --  138  --  138  --   K 2.5* 2.5*   < > 2.9* 3.1* 3.8  --   CL 99 98  --  104  --  107  --   CO2 24  --   --  21*  --  20*  --   GLUCOSE 104* 96  --  177*  --  117*  --   BUN 20 19  --  16  --  21  --   CREATININE 1.61* 1.60*  --  1.27*  --  1.39* 1.28*  CALCIUM 8.1*  --   --  7.7*  --  7.8*  --   PROT 7.3  --   --  6.6  --   --   --   ALBUMIN 3.4*  --   --  3.1*  --   --   --   AST 48*  --   --  192*  --   --   --  ALT 28  --   --  36  --   --   --   ALKPHOS 64  --   --  62  --   --   --   BILITOT 1.5*  --   --  1.1  --   --   --   GFRNONAA 45*  --   --  >60  --  54* 60*  GFRAA 52*  --   --  >60  --  >60 >60  ANIONGAP 13  --   --  13  --  11  --    < > = values in this interval not displayed.     Hematology Recent Labs  Lab 06/05/19 1047 06/06/19 0249 06/07/19 0929  WBC 22.1* 20.3* 12.6*  RBC 4.15* 4.06* 4.01*  HGB 13.3 12.8* 12.7*  HCT 37.9* 37.9* 37.5*  MCV 91.3 93.3 93.5  MCH 32.0 31.5 31.7  MCHC 35.1 33.8 33.9  RDW 12.7 12.9 13.0  PLT 182 172 181    BNP Recent Labs  Lab 06/04/19 2318  BNP 112.6*     DDimer No results for input(s): DDIMER in the last 168 hours.   Radiology    No results found.  Cardiac Studies   Echocardiogram 06/05/2019:  Impressions: 1. Left ventricular ejection fraction, by visual estimation, is 60 to 65%. The left ventricle has normal function. Normal left ventricular size. There is moderately increased left ventricular hypertrophy.  2. Elevated left ventricular end-diastolic pressure.  3. Left ventricular diastolic Doppler parameters are consistent with impaired relaxation pattern of LV diastolic filling.  4. Global right ventricle has normal systolic function.The right ventricular size is normal. No increase in right  ventricular wall thickness.  5. Left atrial size was normal.  6. Right atrial size was normal.  7. Mild mitral annular calcification.  8. The mitral valve is grossly normal. Trace mitral valve regurgitation.  9. The tricuspid valve is grossly normal. Tricuspid valve regurgitation is trivial.  10. The aortic valve is grossly normal Aortic valve regurgitation was not visualized by color flow Doppler. Mild aortic valve sclerosis without stenosis.  11. The pulmonic valve was grossly normal. Pulmonic valve regurgitation is not visualized by color flow Doppler.  12. The interatrial septum was not well visualized.   Patient Profile     63 y.o. male with a history of PAD with occluded right SFA, hypertension, hyperlipidemia, type 2 diabetes on insulin with peripheral neuropathy, GERD, chronic draining pilonidal cyst, bladder cancer s/p resection in 2013, who was admitted on 06/04/2019 for sepsis secondary to UTI after presenting with shortness of breath and fever. Also found to have hypertensive urgency on presentation. Having SVT vs PAF at times irregular   Assessment & Plan    Dyspnea with Diastolic Dysfunction - Chest x-ray showed probable trace left pleural effusion and left lung base atelectasis. Developing infiltrate is not excluded. - Chest CTA negative for PE but suboptimal study.  - BNP minimally elevated at 112.6.  - Echo showed LVEF of 60-65% with normal wall motion and grade 2 diastolic dysfunction with elevated LVEDP.  - Dr. Bronson Ing felt dyspnea was likely be due to hypertensive urgency as it resolved as BP improved.  -negative 580 today has not needed diuretic  Elevated Troponin with CAD - High-sensitivity troponin elevated at 162 >> 509 >> 741 >> 666.  - EKG showed non-specific ST/T changes. - Myoview from 2015 showed a small inferolateral infarct with no significant ischemia.  - Chest CTA on  admission showed calcification of LAD.  - Patient reports occasional brief  episodes right sided chest pain that improves with his inhaler. He has a hard time describing this pain but states he feels like it is related to his lungs. Currently chest pain free. - Likely demand ischemia in setting of sepsis and hypertensive urgency. However, may benefit from additional ischemic evaluation once he recovers from acute illness plan to do as outpt unless has chest pain.  - Continue aspirin, beta-blocker, and statin.   SVT - Patient had short run of SVT yesterday. Currently in sinus rhythm with rates in the 70's to 80's and occasional PVCs. Continues with these episodes begin in 118 and rate climbs to 130s at times irregular - Potassium was repleted and is 3.8 yesterday - Continue Lopressor 50mg  twice daily. Increased yesterday - Continue to monitor on telemetry.  PAD  - Known occluded right SFA. - Continue aspirin and statin. - Followed by Dr. Fletcher Anon.  Hypertension  - BP has been somewhat labile  Today [id number] systolic to [id number] systolic - Home medications include: Amlodipine 2.5mg  daily, HCTZ 25mg  daily, Losartan 100mg . HCTZ and Losartan held due to renal function. Now on lopressor 50 mg BID as well and amlodipine was stopped.     Hyperlipidemia  - LDL 48 in 04/2019.  - Continue home Lipitor 20mg  daily.   Type 2 Diabetes Mellitus  - Management per primary team.   Hypokalemia/Hypomagnesemia - Repleted and now within normal limits.  Sepsis Secondary to UTI/Pyelonephritis - WBC 20.3 today. Last fever was 103.5 06/05/19. - Continue antibiotics. - Management per primary team.     For questions or updates, please contact [place] Please consult [email or web address] for contact info under        Signed, Cecilie Kicks, NP  06/07/2019, 9:52 AM    History and all data above reviewed.  Patient examined.  I agree with the findings as above.  She is not noticing any tachy palpitations.  Denies chest pain or SOB.  The patient exam reveals COR:RRR  ,  Lungs: Clear  ,  Abd:  Positive bowel sounds, no rebound no guarding, Ext No edema.    .  All available labs, radiology testing, previous records reviewed. Agree with documented assessment and plan. Likely will his beta blocker for BP control prior to discharge.   Jeneen Rinks Marie Chow  12:28 PM  06/07/2019

## 2019-06-07 NOTE — Progress Notes (Signed)
Patient has a brief run of SVT with HR 170 patient denies chest pain or discomfort. BP 167/72 labetalol given for BP. Patient resting comfortably in bed.

## 2019-06-07 NOTE — Progress Notes (Signed)
Inpatient Diabetes Program Recommendations  AACE/ADA: New Consensus Statement on Inpatient Glycemic Control (2015)  Target Ranges:  Prepandial:   less than 140 mg/dL      Peak postprandial:   less than 180 mg/dL (1-2 hours)      Critically ill patients:  140 - 180 mg/dL   Lab Results  Component Value Date   GLUCAP 123 (H) 06/07/2019   HGBA1C 8.7 (H) 04/21/2019    Review of Glycemic Control  FBS 123 mg/dL this am. Post-prandials elevated. Needs meal coverage insulin  Inpatient Diabetes Program Recommendations:     Add Novolog 4 units tidwc for meal coverage insulin if pt eats > 50% meals. Needs rapid-acting insulin at discharge.  Will continue to follow.  Thank you. Lorenda Peck, RD, LDN, CDE Inpatient Diabetes Coordinator [phone]

## 2019-06-07 NOTE — Progress Notes (Addendum)
PROGRESS NOTE    [name]  [id number]  DOB: [birth year]  DOA: 06/04/2019 PCP: Biagio Borg, MD  Brief Narrative:  63 year old male with a history of diabetes mellitus, hypertension,PVD,Chronic draining pilonidal cyst, transitional cell  bladder CA s/p resection in 2013, who follows Urology underwent yearly surveillenace cystoscopy on Thursday 10/22 by Dr. Karsten Ro. The following day he started to feel sick,  developed fever/ chills, loss of appetite which progressively worsened to include shortness of breath on the day of presentation to the ED. He did not have any c/o chest pain or hematuria.  ED course: BP was significantly elevated on presentation, labs revealed leucocytosis of 21 k, hypokalemia, hypomagnesemia, abnormal U/A  and troponin elevation at 162-->305 with non specific EKG changes. CT chest -ve for PE. CT abd/pelvis revealed perinephric stranding. Code sepsis was initiated in ED and patient admitted to [facility] with empiric antibiotics(Vanc-cefepime-flagyl) for complicated UTI/sepsis. He required IV hydralazine doses to control BP in the ED. Hospital course: Febrile with Tmax 103.5 on 10/25. HR 55-135. Also having diarrhea now. C.diff sent. Patient evaluated by urology and recommended to continue abx for sepsis. Seen by cardiology who felt patient's c/o shortness of breath and elevated troponin on admission likely related to uncontrolled BP and sepsis. Repeat echo mod LVH with preserved EF. Blood cx and urine cx so far no growth reported. Troponin trended 162->509->741->666  Subjective:  Patient reports feeling better and appears cheerful/comfortable when seen in rounds this morning.  Has been trying to get out of bed to chair somewhat.  Foley catheter draining clear urine.  Afebrile overnight and this morning.  Saturating 94% on 1 L O2 via nasal cannula.  Objective: Vitals:   06/07/19 1200 06/07/19 1302 06/07/19 1600 06/07/19 1800  BP:  (!) 157/61  (!) 143/68  Pulse:  70  77   Resp:  15  (!) 21  Temp: 98.3 F (36.8 C)  98.2 F (36.8 C)   TempSrc: Oral  Oral   SpO2:  97%  98%  Weight:      Height:        Intake/Output Summary (Last 24 hours) at 06/07/2019 1835 Last data filed at 06/07/2019 1800 Gross per 24 hour  Intake 803.83 ml  Output 2675 ml  Net -1871.17 ml   Filed Weights   06/05/19 0239 06/05/19 1025  Weight: 117.9 kg 118.4 kg    Physical Examination:  General exam: Appears calm and comfortable  Respiratory system: Clear to auscultation.  Decreased breath sounds at bases.  Respiratory effort normal. Cardiovascular system: S1 & S2 heard, RRR. No JVD, murmurs, rubs, gallops or clicks. No pedal edema. Gastrointestinal system: Abdomen obese, soft and nontender. No organomegaly or masses felt. Normal bowel sounds heard. Central nervous system: Alert and oriented. No focal neurological deficits. Extremities: Symmetric 5 x 5 power. Skin: No rashes, lesions or ulcers Psychiatry: Judgement and insight appear normal. Mood & affect appropriate.     Data Reviewed: I have personally reviewed following labs and imaging studies  CBC: Recent Labs  Lab 06/04/19 2318 06/04/19 2336 06/05/19 1047 06/06/19 0249 06/07/19 0929  WBC 21.0*  --  22.1* 20.3* 12.6*  NEUTROABS 18.0*  --  18.9*  --   --   HGB 14.1 13.3 13.3 12.8* 12.7*  HCT 40.7 39.0 37.9* 37.9* 37.5*  MCV 91.7  --  91.3 93.3 93.5  PLT 218  --  182 172 0000000   Basic Metabolic Panel: Recent Labs  Lab 06/04/19 2318 06/04/19 2336 06/04/19 2348  06/05/19 1047 06/05/19 1930 06/06/19 0249 06/07/19 0158  NA 136 137  --  138  --  138  --   K 2.5* 2.5* 2.8* 2.9* 3.1* 3.8  --   CL 99 98  --  104  --  107  --   CO2 24  --   --  21*  --  20*  --   GLUCOSE 104* 96  --  177*  --  117*  --   BUN 20 19  --  16  --  21  --   CREATININE 1.61* 1.60*  --  1.27*  --  1.39* 1.28*  CALCIUM 8.1*  --   --  7.7*  --  7.8*  --   MG  --   --  1.1* 1.2* 2.4  --   --    GFR: Estimated Creatinine  Clearance: 74.8 mL/min (A) (by C-G formula based on SCr of 1.28 mg/dL (H)). Liver Function Tests: Recent Labs  Lab 06/04/19 2318 06/05/19 1047  AST 48* 192*  ALT 28 36  ALKPHOS 64 62  BILITOT 1.5* 1.1  PROT 7.3 6.6  ALBUMIN 3.4* 3.1*   No results for input(s): LIPASE, AMYLASE in the last 168 hours. No results for input(s): AMMONIA in the last 168 hours. Coagulation Profile: Recent Labs  Lab 06/04/19 2318  INR 1.1   Cardiac Enzymes: No results for input(s): CKTOTAL, CKMB, CKMBINDEX, TROPONINI in the last 168 hours. BNP (last 3 results) No results for input(s): PROBNP in the last 8760 hours. HbA1C: No results for input(s): HGBA1C in the last 72 hours. CBG: Recent Labs  Lab 06/06/19 1626 06/06/19 2205 06/07/19 0746 06/07/19 1217 06/07/19 1631  GLUCAP 237* 254* 123* 218* 274*   Lipid Profile: No results for input(s): CHOL, HDL, LDLCALC, TRIG, CHOLHDL, LDLDIRECT in the last 72 hours. Thyroid Function Tests: No results for input(s): TSH, T4TOTAL, FREET4, T3FREE, THYROIDAB in the last 72 hours. Anemia Panel: No results for input(s): VITAMINB12, FOLATE, FERRITIN, TIBC, IRON, RETICCTPCT in the last 72 hours. Sepsis Labs: Recent Labs  Lab 06/04/19 2323 06/05/19 0318 06/05/19 1047  PROCALCITON  --   --  6.61  LATICACIDVEN 3.0* 1.5  --     Recent Results (from the past 240 hour(s))  Culture, blood (Routine x 2)     Status: None (Preliminary result)   Collection Time: 06/04/19 11:22 PM   Specimen: BLOOD  Result Value Ref Range Status   Specimen Description   Final    BLOOD RIGHT ANTECUBITAL Performed at [facility], [place] [address] [place], [place] [address]    Special Requests   Final    BOTTLES DRAWN AEROBIC AND ANAEROBIC Blood Culture adequate volume Performed at [place] [address] [place], [place] [address]    Culture   Final    NO GROWTH 2 DAYS Performed at [place] [address],  [place], [place] [address]    Report Status PENDING  Incomplete  Culture, blood (Routine x 2)     Status: None (Preliminary result)   Collection Time: 06/04/19 11:22 PM   Specimen: BLOOD  Result Value Ref Range Status   Specimen Description   Final    BLOOD LEFT ANTECUBITAL Performed at [place] [address] [place], [place] [address]    Special Requests   Final    BOTTLES DRAWN AEROBIC AND ANAEROBIC Blood Culture adequate volume Performed at [name] [address]  [place], [place] [address]    Culture   Final    NO GROWTH 2 DAYS Performed at [facility], [place] [address], [place], [place] [address]    Report Status PENDING  Incomplete  Urine culture     Status: None (Preliminary result)   Collection Time: 06/05/19 12:41 AM   Specimen: In/Out Cath Urine  Result Value Ref Range Status   Specimen Description IN/OUT CATH URINE  Final   Special Requests   Final    NONE Performed at [place] [address], [place], [place] [address]    Report Status PENDING  Incomplete  SARS Coronavirus 2 by RT PCR (hospital order, performed in [facility] hospital lab) Nasopharyngeal Nasopharyngeal Swab     Status: None   Collection Time: 06/05/19  1:10 AM   Specimen: Nasopharyngeal Swab  Result Value Ref Range Status   SARS Coronavirus 2 NEGATIVE NEGATIVE Final    Comment: (NOTE) If result is NEGATIVE SARS-CoV-2 target nucleic acids are NOT DETECTED. The SARS-CoV-2 RNA is generally detectable in upper and lower  respiratory specimens during the acute phase of infection. The lowest  concentration of SARS-CoV-2 viral copies this assay can detect is 250  copies / mL. A negative result does not preclude SARS-CoV-2 infection  and should not be used as the sole basis for treatment or other  patient management decisions.  A negative result may occur with  improper specimen collection / handling, submission of specimen other   than nasopharyngeal swab, presence of viral mutation(s) within the  areas targeted by this assay, and inadequate number of viral copies  (<250 copies / mL). A negative result must be combined with clinical  observations, patient history, and epidemiological information. If result is POSITIVE SARS-CoV-2 target nucleic acids are DETECTED. The SARS-CoV-2 RNA is generally detectable in upper and lower  respiratory specimens dur ing the acute phase of infection.  Positive  results are indicative of active infection with SARS-CoV-2.  Clinical  correlation with patient history and other diagnostic information is  necessary to determine patient infection status.  Positive results do  not rule out bacterial infection or co-infection with other viruses. If result is PRESUMPTIVE POSTIVE SARS-CoV-2 nucleic acids MAY BE PRESENT.   A presumptive positive result was obtained on the submitted specimen  and confirmed on repeat testing.  While 2019 novel coronavirus  (SARS-CoV-2) nucleic acids may be present in the submitted sample  additional confirmatory testing may be necessary for epidemiological  and / or clinical management purposes  to differentiate between  SARS-CoV-2 and other Sarbecovirus currently known to infect humans.  If clinically indicated additional testing with an alternate test  methodology [phone]) is advised. The SARS-CoV-2 RNA is generally  detectable in upper and lower respiratory sp ecimens during the acute  phase of infection. The expected result is Negative. Fact Sheet for Patients:  StrictlyIdeas.no Fact Sheet for Healthcare Providers: BankingDealers.co.za This test is not yet approved or cleared by the [place] FDA and has been authorized for detection and/or diagnosis of SARS-CoV-2 by FDA under an Emergency Use Authorization (EUA).  This EUA will remain in effect (meaning this test can be used) for the duration of  the COVID-19 declaration under Section 564(b)(1) of the Act, 21 U.S.C. section 360bbb-3(b)(1), unless the authorization is terminated or revoked sooner. Performed at [facility], [place] [address] [place], [place] [address]   MRSA PCR Screening     Status: None   Collection Time:  06/05/19 10:33 AM   Specimen: Nasal Mucosa; Nasopharyngeal  Result Value Ref Range Status   MRSA by PCR NEGATIVE NEGATIVE Final    Comment:        The GeneXpert MRSA Assay (FDA approved for NASAL specimens only), is one component of a comprehensive MRSA colonization surveillance program. It is not intended to diagnose MRSA infection nor to guide or monitor treatment for MRSA infections. Performed at [facility], [place] [address] [place], [place] [address]   C difficile quick scan w PCR reflex     Status: None   Collection Time: 06/05/19  4:26 PM   Specimen: STOOL  Result Value Ref Range Status   C Diff antigen NEGATIVE NEGATIVE Final   C Diff toxin NEGATIVE NEGATIVE Final   C Diff interpretation No C. difficile detected.  Final    Comment: Performed at [facility], [place] [address], [place], [place] [address]      Radiology Studies: No results found.      Scheduled Meds: . aspirin EC  81 mg Oral Daily  . Chlorhexidine Gluconate Cloth  6 each Topical Daily  . enoxaparin (LOVENOX) injection  40 mg Subcutaneous Q24H  . insulin aspart  0-20 Units Subcutaneous TID WC  . insulin glargine  50 Units Subcutaneous BID  . loratadine  10 mg Oral Daily  . metoprolol tartrate  75 mg Oral BID  . mirabegron ER  25 mg Oral Daily  . pantoprazole  40 mg Oral Daily  . potassium chloride SA  20 mEq Oral Daily   Continuous Infusions: . ceFEPime (MAXIPIME) IV Stopped (06/07/19 1521)  . vancomycin Stopped (06/07/19 0520)    Assessment & Plan:   1. Sepsis/pyelonephritis : Remains on empiric abx (cefepime/vancomycin).  Off IV fluids.  Urine culture  reported >50,000 CFU of gram-negaods yesterday but now showing as " pending"-will clarify with micro lab in a.m.  Blood culture so far -ve. White count appears improved today from 21-22 K to 12k .  Afebrile for greater than 24 hours.  Will DC vancomycin and continue cefepime for now.  Can likely DC Foley catheter in a.m. per urology.  2. HTNsive Urgency: Improved, SBP fluctuating now 120s to 150s.Cardiology recommended continue home meds, prn hydralazine. Metoprolol dosage increased to 75mg  bid  3. Troponemia :secondary to #2 likely. Echo shows mod LVH, EF 60-65%. Continue asa/statins/b-blockers  4. Severe Hypokalemia/hypomagnesemia:Replaced IV and oral mag/potassium. Not on any diuretics. D/C IV fluids to avoid dilutional effect and BP elevation. Of note, patient having diarrhea and also on large doses of insulin at home that can contribute to hypokalemia  5. Diabetes Mellitus: On large doses of insulin at home (250 U). Reduced to 50 U now in concern for hypoglycemia. Likely diet non compliant at home.  He says he was following Dr. Mena Goes as outpatient for endocrinology and Dr. Jenny Reichmann for primary care with [facility].  He however states he has not seen endocrinology for over a year now.  6. PVD: on asa/statins  7. Diarrhea: Stool C.diff -ve.  GI panel results pending. Had 3 loose stools this morning.  Diet advanced to regular. Loperamide prn.   8. Dyspnea: O2 sats 90-97%. CT chest -ve for PE.  Now off IV fluids.  Patient does have obese abdomen and likely has restrictive lung disease/atelectasis due to poor expansion.  Advised incentive spirometry as noted to improve his sats on taking deep breaths during bedside exam.  Taper O2 to off as discussed  with bedside nurse.  9.  Bladder ca: s/p resection. No abnormal findings on recent surveillance cystoscopy.  Can likely DC Foley catheter per urology in a.m.  Started on Myrbetriq for bladder spasms   DVT prophylaxis: Lovenox Code  Status: Full code Family / Patient Communication: Discussed with patient and all questions answered to satisfaction. Disposition Plan: Likely home when cleared by urology    LOS: 2 days    Time spent:     Guilford Shi, MD [facility] Pager [phone]  If 7PM-7AM, please contact night-coverage [email or web address] Password [email or web address] 06/07/2019, 6:35 PM

## 2019-06-08 DIAGNOSIS — I471 Supraventricular tachycardia: Secondary | ICD-10-CM | POA: Diagnosis not present

## 2019-06-08 DIAGNOSIS — E0800 Diabetes mellitus due to underlying condition with hyperosmolarity without nonketotic hyperglycemic-hyperosmolar coma (NKHHC): Secondary | ICD-10-CM | POA: Diagnosis not present

## 2019-06-08 DIAGNOSIS — R197 Diarrhea, unspecified: Secondary | ICD-10-CM | POA: Diagnosis not present

## 2019-06-08 DIAGNOSIS — A419 Sepsis, unspecified organism: Secondary | ICD-10-CM | POA: Diagnosis not present

## 2019-06-08 DIAGNOSIS — L270 Generalized skin eruption due to drugs and medicaments taken internally: Secondary | ICD-10-CM

## 2019-06-08 DIAGNOSIS — I10 Essential (primary) hypertension: Secondary | ICD-10-CM | POA: Diagnosis not present

## 2019-06-08 LAB — GI PATHOGEN PANEL BY PCR, STOOL

## 2019-06-08 LAB — BASIC METABOLIC PANEL
Anion gap: 9 (ref 5–15)
BUN: 19 mg/dL (ref 8–23)
CO2: 23 mmol/L (ref 22–32)
Calcium: 8 mg/dL — ABNORMAL LOW (ref 8.9–10.3)
Chloride: 103 mmol/L (ref 98–111)
Creatinine, Ser: 1.1 mg/dL (ref 0.61–1.24)
GFR calc Af Amer: >60 mL/min (ref >60)
GFR calc non Af Amer: >60 mL/min (ref >60)
Glucose, Bld: 189 mg/dL — ABNORMAL HIGH (ref 70–99)
Potassium: 3.4 mmol/L — ABNORMAL LOW (ref 3.5–5.1)
Sodium: 135 mmol/L (ref 135–145)

## 2019-06-08 LAB — CBC
HCT: 37.7 % — ABNORMAL LOW (ref 39.0–52.0)
Hemoglobin: 12.6 g/dL — ABNORMAL LOW (ref 13.0–17.0)
MCH: 31.3 pg (ref 26.0–34.0)
MCHC: 33.4 g/dL (ref 30.0–36.0)
MCV: 93.5 fL (ref 80.0–100.0)
Platelets: 178 10*3/uL (ref 150–400)
RBC: 4.03 MIL/uL — ABNORMAL LOW (ref 4.22–5.81)
RDW: 12.7 % (ref 11.5–15.5)
WBC: 10.3 10*3/uL (ref 4.0–10.5)
nRBC: 0 % (ref 0.0–0.2)

## 2019-06-08 LAB — HEMOGLOBIN A1C
Hgb A1c MFr Bld: 8.9 % — ABNORMAL HIGH (ref 4.8–5.6)
Mean Plasma Glucose: 208.73 mg/dL

## 2019-06-08 LAB — URINE CULTURE: Culture: 50000 — AB

## 2019-06-08 LAB — GLUCOSE, CAPILLARY
Glucose-Capillary: 89 mg/dL (ref 70–99)
Glucose-Capillary: 234 mg/dL — ABNORMAL HIGH (ref 70–99)
Glucose-Capillary: 185 mg/dL — ABNORMAL HIGH (ref 70–99)
Glucose-Capillary: 234 mg/dL — ABNORMAL HIGH (ref 70–99)

## 2019-06-08 MED ORDER — HYDRALAZINE HCL 20 MG/ML IJ SOLN
10.0000 mg | INTRAMUSCULAR | Status: DC | PRN
  Administered 2019-06-08: 20 mg via INTRAVENOUS
  Administered 2019-06-08: 10 mg via INTRAVENOUS
  Filled 2019-06-08 (×2): qty 1

## 2019-06-08 MED ORDER — INSULIN ASPART 100 UNIT/ML ~~LOC~~ SOLN: 2.0000 [IU] | Freq: Three times a day (TID) | SUBCUTANEOUS | Status: DC

## 2019-06-08 MED ORDER — DIPHENHYDRAMINE HCL 25 MG PO CAPS
25.0000 mg | ORAL_CAPSULE | ORAL | Status: DC | PRN
  Administered 2019-06-08: 25 mg via ORAL
  Filled 2019-06-08: qty 1

## 2019-06-08 MED ORDER — INSULIN ASPART 100 UNIT/ML ~~LOC~~ SOLN: 3.0000 [IU] | Freq: Three times a day (TID) | SUBCUTANEOUS | Status: DC

## 2019-06-08 MED ORDER — DIPHENHYDRAMINE HCL 25 MG PO CAPS
25.0000 mg | ORAL_CAPSULE | Freq: Once | ORAL | Status: AC
  Administered 2019-06-08: 01:00:00 25 mg via ORAL
  Filled 2019-06-08: qty 1

## 2019-06-08 MED ORDER — LOSARTAN POTASSIUM 50 MG PO TABS
50.0000 mg | ORAL_TABLET | Freq: Every day | ORAL | Status: DC
  Administered 2019-06-08 – 2019-06-09 (×2): 50 mg via ORAL
  Filled 2019-06-08 (×2): qty 1

## 2019-06-08 MED ORDER — SULFAMETHOXAZOLE-TRIMETHOPRIM 800-160 MG PO TABS
1.0000 | ORAL_TABLET | Freq: Two times a day (BID) | ORAL | Status: DC
  Administered 2019-06-08 – 2019-06-09 (×2): 1 via ORAL
  Filled 2019-06-08 (×4): qty 1

## 2019-06-08 MED ORDER — ATORVASTATIN CALCIUM 10 MG PO TABS
20.0000 mg | ORAL_TABLET | Freq: Every day | ORAL | Status: DC
  Administered 2019-06-08: 17:00:00 20 mg via ORAL
  Filled 2019-06-08: qty 2

## 2019-06-08 MED ORDER — POTASSIUM CHLORIDE CRYS ER 20 MEQ PO TBCR
60.0000 meq | EXTENDED_RELEASE_TABLET | Freq: Once | ORAL | Status: AC
  Administered 2019-06-08: 60 meq via ORAL
  Filled 2019-06-08: qty 3

## 2019-06-08 MED ORDER — HYDROCHLOROTHIAZIDE 25 MG PO TABS
25.0000 mg | ORAL_TABLET | Freq: Every day | ORAL | Status: DC
  Administered 2019-06-08 – 2019-06-09 (×2): 25 mg via ORAL
  Filled 2019-06-08 (×2): qty 1

## 2019-06-08 MED ORDER — INSULIN ASPART 100 UNIT/ML ~~LOC~~ SOLN
3.0000 [IU] | Freq: Three times a day (TID) | SUBCUTANEOUS | Status: DC
  Administered 2019-06-08 – 2019-06-09 (×3): 3 [IU] via SUBCUTANEOUS

## 2019-06-08 MED ORDER — DIPHENHYDRAMINE HCL 25 MG PO CAPS
25.0000 mg | ORAL_CAPSULE | Freq: Once | ORAL | Status: AC
  Administered 2019-06-08: 04:00:00 25 mg via ORAL
  Filled 2019-06-08: qty 1

## 2019-06-08 NOTE — Progress Notes (Addendum)
PROGRESS NOTE    [name]  [id number]  DOB: [birth year]  DOA: 06/04/2019 PCP: Biagio Borg, MD  Brief Narrative:  63 year old male with a history of diabetes mellitus, hypertension,PVD,Chronic draining pilonidal cyst, transitional cell  bladder CA s/p resection in 2013, who follows Urology underwent yearly surveillenace cystoscopy on Thursday 10/22 by Dr. Karsten Ro. The following day he started to feel sick,  developed fever/ chills, loss of appetite which progressively worsened to include shortness of breath on the day of presentation to the ED. He did not have any c/o chest pain or hematuria.  ED course: BP was significantly elevated on presentation, labs revealed leucocytosis of 21 k, hypokalemia, hypomagnesemia, abnormal U/A  and troponin elevation at 162-->305 with non specific EKG changes. CT chest -ve for PE. CT abd/pelvis revealed perinephric stranding. Code sepsis was initiated in ED and patient admitted to [facility] with empiric antibiotics(Vanc-cefepime-flagyl) for complicated UTI/sepsis. He required IV hydralazine doses to control BP in the ED. Hospital course: Febrile with Tmax 103.5 on 10/25. HR 55-135. Also having diarrhea now. C.diff sent. Patient evaluated by urology and recommended to continue abx for sepsis. Seen by cardiology who felt patient's c/o shortness of breath and elevated troponin on admission likely related to uncontrolled BP and sepsis. Repeat echo mod LVH with preserved EF. Blood cx and urine cx so far no growth reported. Troponin trended 162->509->741->666  Subjective:  Patient developed a rash (itching with hives on the trunk) overnight.  The only new medication which was initiated yesterday was Myrbetriq by urology.  He has been receiving metoprolol since admission at 25 mg twice daily which was gradually escalated to 50 mg and 75 mg yesterday by cardiology.  Given the timing of p.m. dose of this medication and onset of rash, metoprolol was also held last night.   Patient also been on cefepime since admission for sepsis. This morning patient concerned about his uncontrolled blood glucose levels.  States rash and itching improved since taking Benadryl.  Heart rate currently in the 70s at rest.  Objective: Vitals:   06/08/19 0328 06/08/19 0800 06/08/19 1015 06/08/19 1227  BP:   (!) 180/77 (!) 173/88  Pulse:   78 80  Resp:   (!) 25 (!) 23  Temp: 98.1 F (36.7 C) 98.9 F (37.2 C)    TempSrc: Oral Oral    SpO2:   93% 96%  Weight:      Height:        Intake/Output Summary (Last 24 hours) at 06/08/2019 1231 Last data filed at 06/08/2019 1100 Gross per 24 hour  Intake 731.99 ml  Output 2550 ml  Net -1818.01 ml   Filed Weights   06/05/19 0239 06/05/19 1025  Weight: 117.9 kg 118.4 kg    Physical Examination:  General exam: In in bedside chair, no acute respiratory distress. Respiratory system: Clear to auscultation.  Decreased breath sounds at bases.  Respiratory effort normal. Cardiovascular system: S1 & S2 heard, RRR. No JVD, murmurs, rubs, gallops or clicks. No pedal edema. Gastrointestinal system: Abdomen obese, soft and nontender. No organomegaly or masses felt. Normal bowel sounds heard. Central nervous system: Alert and oriented. No focal neurological deficits. Extremities: Symmetric 5 x 5 power. Skin: Residual drug rash on the back-geographic macular blanching Psychiatry: Judgement and insight appear normal. Mood & affect somewhat anxious.     Data Reviewed: I have personally reviewed following labs and imaging studies  CBC: Recent Labs  Lab 06/04/19 2318 06/04/19 2336 06/05/19 1047 06/06/19 0249 06/07/19 [id number]  06/08/19 0206  WBC 21.0*  --  22.1* 20.3* 12.6* 10.3  NEUTROABS 18.0*  --  18.9*  --   --   --   HGB 14.1 13.3 13.3 12.8* 12.7* 12.6*  HCT 40.7 39.0 37.9* 37.9* 37.5* 37.7*  MCV 91.7  --  91.3 93.3 93.5 93.5  PLT 218  --  182 172 181 0000000   Basic Metabolic Panel: Recent Labs  Lab 06/04/19 2318 06/04/19 2336  06/04/19 2348 06/05/19 1047 06/05/19 1930 06/06/19 0249 06/07/19 0158 06/08/19 0206  NA 136 137  --  138  --  138  --  135  K 2.5* 2.5* 2.8* 2.9* 3.1* 3.8  --  3.4*  CL 99 98  --  104  --  107  --  103  CO2 24  --   --  21*  --  20*  --  23  GLUCOSE 104* 96  --  177*  --  117*  --  189*  BUN 20 19  --  16  --  21  --  19  CREATININE 1.61* 1.60*  --  1.27*  --  1.39* 1.28* 1.10  CALCIUM 8.1*  --   --  7.7*  --  7.8*  --  8.0*  MG  --   --  1.1* 1.2* 2.4  --   --   --    GFR: Estimated Creatinine Clearance: 87.1 mL/min (by C-G formula based on SCr of 1.1 mg/dL). Liver Function Tests: Recent Labs  Lab 06/04/19 2318 06/05/19 1047  AST 48* 192*  ALT 28 36  ALKPHOS 64 62  BILITOT 1.5* 1.1  PROT 7.3 6.6  ALBUMIN 3.4* 3.1*   No results for input(s): LIPASE, AMYLASE in the last 168 hours. No results for input(s): AMMONIA in the last 168 hours. Coagulation Profile: Recent Labs  Lab 06/04/19 2318  INR 1.1   Cardiac Enzymes: No results for input(s): CKTOTAL, CKMB, CKMBINDEX, TROPONINI in the last 168 hours. BNP (last 3 results) No results for input(s): PROBNP in the last 8760 hours. HbA1C: No results for input(s): HGBA1C in the last 72 hours. CBG: Recent Labs  Lab 06/07/19 0746 06/07/19 1217 06/07/19 1631 06/07/19 1949 06/07/19 2339  GLUCAP 123* 218* 274* 323* 221*   Lipid Profile: No results for input(s): CHOL, HDL, LDLCALC, TRIG, CHOLHDL, LDLDIRECT in the last 72 hours. Thyroid Function Tests: No results for input(s): TSH, T4TOTAL, FREET4, T3FREE, THYROIDAB in the last 72 hours. Anemia Panel: No results for input(s): VITAMINB12, FOLATE, FERRITIN, TIBC, IRON, RETICCTPCT in the last 72 hours. Sepsis Labs: Recent Labs  Lab 06/04/19 2323 06/05/19 0318 06/05/19 1047  PROCALCITON  --   --  6.61  LATICACIDVEN 3.0* 1.5  --     Recent Results (from the past 240 hour(s))  Culture, blood (Routine x 2)     Status: None (Preliminary result)   Collection Time:  06/04/19 11:22 PM   Specimen: BLOOD  Result Value Ref Range Status   Specimen Description   Final    BLOOD RIGHT ANTECUBITAL Performed at [facility], [place] [address] [place], [place] [address]    Special Requests   Final    BOTTLES DRAWN AEROBIC AND ANAEROBIC Blood Culture adequate volume Performed at [facility] [address], [place], [place] [address]    Culture   Final    NO GROWTH 3 DAYS Performed at [facility], [place] [address], [place], [place] [address]    Report Status  PENDING  Incomplete  Culture, blood (Routine x 2)     Status: None (Preliminary result)   Collection Time: 06/04/19 11:22 PM   Specimen: BLOOD  Result Value Ref Range Status   Specimen Description   Final    BLOOD LEFT ANTECUBITAL Performed at [facility] [address], [place], [place] [address]    Special Requests   Final    BOTTLES DRAWN AEROBIC AND ANAEROBIC Blood Culture adequate volume Performed at [facility] [address], [place], [place] [address]    Culture   Final    NO GROWTH 3 DAYS Performed at [facility], [place] [address] [place], [place] [address]    Report Status PENDING  Incomplete  Urine culture     Status: Abnormal   Collection Time: 06/05/19 12:41 AM   Specimen: In/Out Cath Urine  Result Value Ref Range Status   Specimen Description   Final    IN/OUT CATH URINE Performed at [name] [address], [place], [place] [address]    Special Requests   Final    NONE Performed at [facility], [place] [address], [place], [place] [address]    Culture 50,000 COLONIES/mL ESCHERICHIA COLI (A)  Final   Report Status 06/08/2019 FINAL  Final   Organism ID, Bacteria ESCHERICHIA COLI (A)  Final      Susceptibility   Escherichia coli - MIC*    AMPICILLIN >=32 RESISTANT Resistant     CEFAZOLIN 8 SENSITIVE Sensitive     CEFTRIAXONE <=1 SENSITIVE  Sensitive     CIPROFLOXACIN <=0.25 SENSITIVE Sensitive     GENTAMICIN <=1 SENSITIVE Sensitive     IMIPENEM <=0.25 SENSITIVE Sensitive     NITROFURANTOIN <=16 SENSITIVE Sensitive     TRIMETH/SULFA <=20 SENSITIVE Sensitive     AMPICILLIN/SULBACTAM 16 INTERMEDIATE Intermediate     PIP/TAZO <=4 SENSITIVE Sensitive     Extended ESBL NEGATIVE Sensitive     * 50,000 COLONIES/mL ESCHERICHIA COLI  SARS Coronavirus 2 by RT PCR (hospital order, performed in [facility] hospital lab) Nasopharyngeal Nasopharyngeal Swab     Status: None   Collection Time: 06/05/19  1:10 AM   Specimen: Nasopharyngeal Swab  Result Value Ref Range Status   SARS Coronavirus 2 NEGATIVE NEGATIVE Final    Comment: (NOTE) If result is NEGATIVE SARS-CoV-2 target nucleic acids are NOT DETECTED. The SARS-CoV-2 RNA is generally detectable in upper and lower  respiratory specimens during the acute phase of infection. The lowest  concentration of SARS-CoV-2 viral copies this assay can detect is 250  copies / mL. A negative result does not preclude SARS-CoV-2 infection  and should not be used as the sole basis for treatment or other  patient management decisions.  A negative result may occur with  improper specimen collection / handling, submission of specimen other  than nasopharyngeal swab, presence of viral mutation(s) within the  areas targeted by this assay, and inadequate number of viral copies  (<250 copies / mL). A negative result must be combined with clinical  observations, patient history, and epidemiological information. If result is POSITIVE SARS-CoV-2 target nucleic acids are DETECTED. The SARS-CoV-2 RNA is generally detectable in upper and lower  respiratory specimens dur ing the acute phase of infection.  Positive  results are indicative of active infection with SARS-CoV-2.  Clinical  correlation with patient history and other diagnostic information is  necessary to determine patient infection status.   Positive results do  not rule out bacterial infection or co-infection with other viruses. If result is PRESUMPTIVE POSTIVE SARS-CoV-2 nucleic acids MAY BE PRESENT.   A presumptive positive result was obtained on the submitted specimen  and confirmed on repeat testing.  While 2019 novel coronavirus  (SARS-CoV-2) nucleic acids may be present in the submitted sample  additional confirmatory testing may be necessary for epidemiological  and / or clinical management purposes  to differentiate between  SARS-CoV-2 and other Sarbecovirus currently known to infect humans.  If clinically indicated additional testing with an alternate test  methodology [phone]) is advised. The SARS-CoV-2 RNA is generally  detectable in upper and lower respiratory sp ecimens during the acute  phase of infection. The expected result is Negative. Fact Sheet for Patients:  StrictlyIdeas.no Fact Sheet for Healthcare Providers: BankingDealers.co.za This test is not yet approved or cleared by the [place] FDA and has been authorized for detection and/or diagnosis of SARS-CoV-2 by FDA under an Emergency Use Authorization (EUA).  This EUA will remain in effect (meaning this test can be used) for the duration of the COVID-19 declaration under Section 564(b)(1) of the Act, 21 U.S.C. section 360bbb-3(b)(1), unless the authorization is terminated or revoked sooner. Performed at [facility], [place] [address], [place], [place] [address]   MRSA PCR Screening     Status: None   Collection Time: 06/05/19 10:33 AM   Specimen: Nasal Mucosa; Nasopharyngeal  Result Value Ref Range Status   MRSA by PCR NEGATIVE NEGATIVE Final    Comment:        The GeneXpert MRSA Assay (FDA approved for NASAL specimens only), is one component of a comprehensive MRSA colonization surveillance program. It is not intended to diagnose MRSA infection nor to guide or monitor  treatment for MRSA infections. Performed at [facility], [place] [address], [place], [place] [address]   C difficile quick scan w PCR reflex     Status: None   Collection Time: 06/05/19  4:26 PM   Specimen: STOOL  Result Value Ref Range Status   C Diff antigen NEGATIVE NEGATIVE Final   C Diff toxin NEGATIVE NEGATIVE Final   C Diff interpretation No C. difficile detected.  Final    Comment: Performed at [facility], [place] [address], [place], [place] [address]      Radiology Studies: No results found.      Scheduled Meds: . aspirin EC  81 mg Oral Daily  . atorvastatin  20 mg Oral q1800  . Chlorhexidine Gluconate Cloth  6 each Topical Daily  . enoxaparin (LOVENOX) injection  40 mg Subcutaneous Q24H  . hydrochlorothiazide  25 mg Oral Daily  . insulin aspart  0-20 Units Subcutaneous TID WC  . insulin aspart  3 Units Subcutaneous TID WC  . insulin glargine  50 Units Subcutaneous BID  . loratadine  10 mg Oral Daily  . losartan  50 mg Oral Daily  . pantoprazole  40 mg Oral Daily  . potassium chloride SA  20 mEq Oral Daily   Continuous Infusions:   Assessment & Plan:   1. Sepsis/pyelonephritis : Patient admitted with empiric abx (cefepime/vancomycin).  Hypotension resolved and now off IV fluids.  Urine culture growing>50,000 CFU E. coli-sensitivities now available. Blood culture so far -ve. White count appears to be resolving from 21-->12k--> 10K.  Afebrile for greater than 48 hours. Vancomycin discontinued yesterday and cefepime not administered since patient developed a rash.  Discussed with pharmacy will transition to Bactrim (preferred  to avoid quinolones given ongoing cardiac issues).  Possible DC Foley catheter today per urology.  2. HTNsive Urgency/PSVT's: SBP improved and was fluctuating 120s to 150s after resumption of home meds and metoprolol added by cardiology in concern for episodes of PSVT's.  Metoprolol however discontinued  overnight as patient developed a rash shortly after he got evening dose.  Can consider changing Norvasc to diltiazem--defer to cardiology.  3. Troponemia :secondary to #2 likely.  He with nonspecific ST-T changes.  Atherosclerosis reported CT scan.  Echo shows mod LVH, EF 60-65%. Continue asa/statins/b-blockers.  Patient reports he had a?  Stress test as outpatient almost 3 years back.  Defer further work-up to cardiology.  No inpatient work-up recommended as of now.  Continue aspirin and statins  4. Severe Hypokalemia/hypomagnesemia:Replaced IV and oral mag/potassium.  Now on hydrochlorothiazide-continue to replace potassium as needed.. D/Ced IV fluids to avoid dilutional effect and BP elevation. Of note, patient having diarrhea and also on large doses of insulin at home that can contribute to hypokalemia.  5. Diabetes Mellitus: On large doses of insulin at home (250 U). Reduced to 50 U now in concern for hypoglycemia.  Blood glucose up in 200s now.  Added premeal insulin at 3 units today -can go up to 4 units per diabetes coordinator recommendations.  Likely diet non compliant at home.  He says he was following Dr. Mena Goes as outpatient for endocrinology and Dr. Jenny Reichmann for primary care with [facility].  He however states he has not seen endocrinology for over a year now--he should follow-up with endocrinology upon discharge.  6.  Drug rash: Could be delayed drug rash from cephalosporins.  Doubt if metoprolol is the real culprit.  Myrbetriq held for now as well.  Patient reluctant about changing Norvasc to Cardizem.  Will await cardiology recommendations.  If patient continues to refuse Cardizem, can challenge with low-dose metoprolol while in hospital.  Continue to utilize Benadryl as needed for itching.  He feels better today.  Will hold off steroids and concern for hyperglycemia unless really needed.  7. Diarrhea: Stool C.diff -ve.  GI panel results ? still pending.  Consider repeating as  not sure if sample was collected properly previously.  Had 2 loose stools this morning.  Diet advanced to regular. Loperamide prn.   8. Dyspnea: O2 sats 90-97%. CT chest -ve for PE.  Now off IV fluids.  Patient does have obese abdomen and likely has restrictive lung disease/atelectasis due to poor expansion.  Advised incentive spirometry as noted to improve his sats on taking deep breaths during bedside exam.  Tapered O2 to off now.  9.  Bladder ca: s/p resection. No abnormal findings on recent surveillance cystoscopy.  Can likely DC Foley catheter per urology in a.m.  Started on Myrbetriq for bladder spasms  10. PVD: on asa/statins  DVT prophylaxis: Lovenox Code Status: Full code Family / Patient Communication: Discussed with patient and all questions answered to satisfaction. Disposition Plan: Likely home when cleared by urology    LOS: 3 days    Time spent: 35 minutes    Guilford Shi, MD [facility] Pager [phone]  If 7PM-7AM, please contact night-coverage [email or web address] Password [facility] 06/08/2019, 12:31 PM

## 2019-06-08 NOTE — Progress Notes (Addendum)
ON: had allergic reaction to something (itching rash): metoprolol was new at 2105, rash then developed at around MN.  Only other new drug was mirapex for bladder relaxation at 537pm.  Other drugs he got this evening include: cefepime, lantus.  1) Switching to hydralazine PRN for HTN. 2) stopping mirapex 3) lantus seems very unlikely to be the cause, he has been on lantus chronically at home for a long time 4) will switch cefepime to aztreonam per pharm. 5) getting second dose of 25mg  PO benadryl for itching and rash, dont think that he needs steroids at this time: per RN rash cleared up on face, about the same as when it started on legs and back, no trouble breathing currently, though apparently had trouble earlier in evening after rash onset, this apparently has already resolved at this time.

## 2019-06-08 NOTE — Progress Notes (Addendum)
Progress Note  Patient Name: [name] Date of Encounter: 06/08/2019  Primary Cardiologist: Kathlyn Sacramento, MD   Subjective   Overnight events: patient developed a rash and mild SOB overnight. He has been on multiple new medications this admission including metoprolol, cefepime, and myrbetriq; all of which have been discontinued.    He has no complaints this morning. Feels rash is gone. No complaints of SOB this morning. Denies chest pain or palpitations.   Inpatient Medications    Scheduled Meds: . aspirin EC  81 mg Oral Daily  . atorvastatin  20 mg Oral q1800  . Chlorhexidine Gluconate Cloth  6 each Topical Daily  . enoxaparin (LOVENOX) injection  40 mg Subcutaneous Q24H  . insulin aspart  0-20 Units Subcutaneous TID WC  . insulin glargine  50 Units Subcutaneous BID  . loratadine  10 mg Oral Daily  . pantoprazole  40 mg Oral Daily  . potassium chloride SA  20 mEq Oral Daily   Continuous Infusions: . ceFEPime (MAXIPIME) IV Stopped (06/07/19 2143)   PRN Meds: acetaminophen, hydrALAZINE, loperamide, ondansetron (ZOFRAN) IV   Vital Signs    Vitals:   06/08/19 0000 06/08/19 0138 06/08/19 0316 06/08/19 0328  BP:  (!) 183/71 (!) 164/59   Pulse:  95 73   Resp:  (!) 23 18   Temp: 99.1 F (37.3 C)   98.1 F (36.7 C)  TempSrc: Oral   Oral  SpO2:  90% 92%   Weight:      Height:        Intake/Output Summary (Last 24 hours) at 06/08/2019 [id number] Last data filed at 06/08/2019 0500 Gross per 24 hour  Intake 731.99 ml  Output 2350 ml  Net -1618.01 ml   Filed Weights   06/05/19 0239 06/05/19 1025  Weight: 117.9 kg 118.4 kg    Telemetry    Sinus rhythm with occasional PVCs, no recurrent SVT in the past 24 hours - Personally Reviewed  ECG    No new tracings - Personally Reviewed  Physical Exam   GEN: Laying in bed in no acute distress.   Neck: No JVD, no carotid bruits Cardiac: RRR, no murmurs, rubs, or gallops.  Respiratory: Clear to auscultation  bilaterally, no wheezes/ rales/ rhonchi GI: NABS, soft, obese, nontender, non-distended  MS: No edema; No deformity. Neuro:  Nonfocal, moving all extremities spontaneously Psych: Normal affect   Labs    Chemistry Recent Labs  Lab 06/04/19 2318  06/05/19 1047 06/05/19 1930 06/06/19 0249 06/07/19 0158 06/08/19 0206  NA 136   < > 138  --  138  --  135  K 2.5*   < > 2.9* 3.1* 3.8  --  3.4*  CL 99   < > 104  --  107  --  103  CO2 24  --  21*  --  20*  --  23  GLUCOSE 104*   < > 177*  --  117*  --  189*  BUN 20   < > 16  --  21  --  19  CREATININE 1.61*   < > 1.27*  --  1.39* 1.28* 1.10  CALCIUM 8.1*  --  7.7*  --  7.8*  --  8.0*  PROT 7.3  --  6.6  --   --   --   --   ALBUMIN 3.4*  --  3.1*  --   --   --   --   AST 48*  --  192*  --   --   --   --  ALT 28  --  36  --   --   --   --   ALKPHOS 64  --  62  --   --   --   --   BILITOT 1.5*  --  1.1  --   --   --   --   GFRNONAA 45*  --  >60  --  54* 60* >60  GFRAA 52*  --  >60  --  >60 >60 >60  ANIONGAP 13  --  13  --  11  --  9   < > = values in this interval not displayed.     Hematology Recent Labs  Lab 06/06/19 0249 06/07/19 0929 06/08/19 0206  WBC 20.3* 12.6* 10.3  RBC 4.06* 4.01* 4.03*  HGB 12.8* 12.7* 12.6*  HCT 37.9* 37.5* 37.7*  MCV 93.3 93.5 93.5  MCH 31.5 31.7 31.3  MCHC 33.8 33.9 33.4  RDW 12.9 13.0 12.7  PLT 172 181 178    Cardiac EnzymesNo results for input(s): TROPONINI in the last 168 hours. No results for input(s): TROPIPOC in the last 168 hours.   BNP Recent Labs  Lab 06/04/19 2318  BNP 112.6*     DDimer No results for input(s): DDIMER in the last 168 hours.   Radiology    No results found.  Cardiac Studies   Echocardiogram 06/05/2019: Impressions: 1. Left ventricular ejection fraction, by visual estimation, is 60 to 65%. The left ventricle has normal function. Normal left ventricular size. There is moderately increased left ventricular hypertrophy.  2. Elevated left ventricular  end-diastolic pressure.  3. Left ventricular diastolic Doppler parameters are consistent with impaired relaxation pattern of LV diastolic filling.  4. Global right ventricle has normal systolic function.The right ventricular size is normal. No increase in right ventricular wall thickness.  5. Left atrial size was normal.  6. Right atrial size was normal.  7. Mild mitral annular calcification.  8. The mitral valve is grossly normal. Trace mitral valve regurgitation.  9. The tricuspid valve is grossly normal. Tricuspid valve regurgitation is trivial.  10. The aortic valve is grossly normal Aortic valve regurgitation was not visualized by color flow Doppler. Mild aortic valve sclerosis without stenosis.  11. The pulmonic valve was grossly normal. Pulmonic valve regurgitation is not visualized by color flow Doppler.  12. The interatrial septum was not well visualized.   Patient Profile     63 y.o. male with a history of PAD with occluded right SFA, hypertension, hyperlipidemia, type 2 diabetes on insulin with peripheral neuropathy, GERD, chronic draining pilonidal cyst, bladder cancer s/p resection in 2013,who was admitted on 06/04/2019 for sepsis secondary to UTI after presenting with shortness of breath and fever. Also found to have hypertensive urgency on presentation. Having SVT vs PAF at times irregular   Assessment & Plan    1. SVT: patient with intermittent bursts of SVT this admission with rates up to 130s, possibly irregular at times. He was started on metoprolol this admission which was uptitrated to 50mg  BID yesterday. Unfortunately he developed a rash overnight which was felt to be possibly related to the metoprolol which was discontinued. K 3.4 today - repletion ordered.  - Will discuss starting diltiazem vs watchful waiting to determine if SVT reoccurs with Dr. Percival Spanish  2. Elevated troponin in patient with CAD on CT scan: LAD calcifications and atherosclerosis of the aorta  noted on CT chest this admission. EKG with non-specific ST/T wave abnormalities. Trop trend: 201 408 2235. Trend not consistent  with ACS. Likely demand ischemia in the setting of hypertensive urgency and infection.  Last ischemic evaluation was a NST in 2015 which was reportedly abnormal but low risk.  - Consider outpatient ischemic evaluation after resolution of present illness - now that Cr is back to normal, could consider coronary CTA given previously abnormal NST.  - Continue aspirin and statin  3. Chronic diastolic CHF: BNP mildly elevated to 112 this admission. CXR with trace left pleural effusion. Echo with G2DD and elevated LVEDP. Felt that dyspnea was related to hypertensive urgency which resolved with improvement in HTN. He has not been diuresed this admission but maintains net negative volume status (-1.6L int he past 24 hours and -2.1L this admission).   4. Hypertensive urgency: BP poorly controlled. Now with suspected allergy to metoprolol which was started this admission.  - Will restart losartan (1/2 home dose) and HCTZ given Cr is back to baseline with plans to titrate as needed.  5. Hyperlipidemia: LDL 48 in 04/2019.  - Continue home Lipitor 20mg  daily.   6. Type 2 Diabetes Mellitus  - Management per primary team.   7. Sepsis Secondary to UTI/Pyelonephritis: WBC trended down to normal ; peaked at 22.1. Last fever was 103.5 06/05/19. On IV antibiotics with possible reaction to cefepime, reported to be transitioning to aztreonam, however cefepime still ordered.  - Management per primary team.  8. Allergic reaction: patient with rash and mild SOB overnight. New medications this admission include cefepime, metoprolol, and myrbetriq. Felt that metoprolol was most likely offending agents as rash started after higher dose (75mg ) of metoprolol was administered. All reported to be discontinued, however cefepime is still ordered. Rash improving with po benadryl - Continue management  per primary team   For questions or updates, please contact [place] Please consult [email or web address] for contact info under Cardiology/STEMI.      Signed, Abigail Butts, PA-C  06/08/2019, 8:33 AM   [phone]  History and all data above reviewed.  Patient examined.  I agree with the findings as above. No complaints of pain.  Rash as noted above.  The patient exam reveals COR:RRR  ,  Lungs: Clear  ,  Abd: Positive bowel sounds, no rebound no guarding, Ext No edema  .  All available labs, radiology testing, previous records reviewed. Agree with documented assessment and plan. Agree with starting Cozaar.  We will see as needed.    Jeneen Rinks Tinnie Kunin  12:01 PM  06/08/2019

## 2019-06-09 ENCOUNTER — Telehealth: Payer: Self-pay

## 2019-06-09 DIAGNOSIS — A419 Sepsis, unspecified organism: Secondary | ICD-10-CM | POA: Diagnosis not present

## 2019-06-09 DIAGNOSIS — R778 Other specified abnormalities of plasma proteins: Secondary | ICD-10-CM

## 2019-06-09 DIAGNOSIS — E876 Hypokalemia: Secondary | ICD-10-CM | POA: Diagnosis not present

## 2019-06-09 LAB — BASIC METABOLIC PANEL
Anion gap: 9 (ref 5–15)
BUN: 20 mg/dL (ref 8–23)
CO2: 26 mmol/L (ref 22–32)
Calcium: 9 mg/dL (ref 8.9–10.3)
Chloride: 103 mmol/L (ref 98–111)
Creatinine, Ser: 1.14 mg/dL (ref 0.61–1.24)
GFR calc Af Amer: >60 mL/min (ref >60)
GFR calc non Af Amer: >60 mL/min (ref >60)
Glucose, Bld: 152 mg/dL — ABNORMAL HIGH (ref 70–99)
Potassium: 3.7 mmol/L (ref 3.5–5.1)
Sodium: 138 mmol/L (ref 135–145)

## 2019-06-09 LAB — GLUCOSE, CAPILLARY
Glucose-Capillary: 129 mg/dL — ABNORMAL HIGH (ref 70–99)
Glucose-Capillary: 133 mg/dL — ABNORMAL HIGH (ref 70–99)

## 2019-06-09 LAB — CBC
HCT: 39.1 % (ref 39.0–52.0)
Hemoglobin: 13.4 g/dL (ref 13.0–17.0)
MCH: 31.8 pg (ref 26.0–34.0)
MCHC: 34.3 g/dL (ref 30.0–36.0)
MCV: 92.7 fL (ref 80.0–100.0)
Platelets: 259 10*3/uL (ref 150–400)
RBC: 4.22 MIL/uL (ref 4.22–5.81)
RDW: 12.9 % (ref 11.5–15.5)
WBC: 12.5 10*3/uL — ABNORMAL HIGH (ref 4.0–10.5)
nRBC: 0 % (ref 0.0–0.2)

## 2019-06-09 MED ORDER — SULFAMETHOXAZOLE-TRIMETHOPRIM 800-160 MG PO TABS: 1.0000 | ORAL_TABLET | Freq: Two times a day (BID) | ORAL | 0 refills | Status: DC

## 2019-06-09 MED ORDER — LOSARTAN POTASSIUM 50 MG PO TABS: 50.0000 mg | ORAL_TABLET | Freq: Every day | ORAL | 2 refills | Status: DC

## 2019-06-09 MED ORDER — METOPROLOL TARTRATE 25 MG PO TABS: 25.0000 mg | ORAL_TABLET | Freq: Two times a day (BID) | ORAL | 11 refills | Status: DC

## 2019-06-09 MED ORDER — INSULIN ASPART 100 UNIT/ML ~~LOC~~ SOLN: 4.0000 [IU] | Freq: Three times a day (TID) | SUBCUTANEOUS | 11 refills | Status: DC

## 2019-06-09 MED ORDER — ENOXAPARIN SODIUM 60 MG/0.6ML ~~LOC~~ SOLN
60.0000 mg | SUBCUTANEOUS | Status: DC
  Administered 2019-06-09: 60 mg via SUBCUTANEOUS
  Filled 2019-06-09: qty 0.6

## 2019-06-09 MED ORDER — INSULIN ASPART 100 UNIT/ML ~~LOC~~ SOLN: 3.0000 [IU] | Freq: Three times a day (TID) | SUBCUTANEOUS | 11 refills | Status: DC

## 2019-06-09 MED ORDER — INSULIN GLARGINE 100 UNIT/ML ~~LOC~~ SOLN: 50.0000 [IU] | Freq: Two times a day (BID) | SUBCUTANEOUS | 11 refills | Status: DC

## 2019-06-09 NOTE — Telephone Encounter (Signed)
Yes, that is OK, as insulin needs are different in the hospital.  Please come back after you get home, and we'll recheck this.

## 2019-06-09 NOTE — Progress Notes (Signed)
Patient ID: [name], male   DOB: [birth year], 63 y.o.   MRN: [id number]    Assessment:  UTI:  Did eventually have low level growth (50K CFU) of E coli from the urine.  He remains afebrile.  His blood cultures have remained negative.  He developed a rash that may have been secondary to Myrbetriq which was stopped.  He is not having any bladder spasms and I think it is time to get his catheter out.   Plan:  1.  DC Foley catheter. 2.  Home on oral antibiotics for a total of 2 weeks of therapy. 3.  His follow-up with me has been scheduled.   Subjective: Patient reports no complaints this morning.  No further difficulty fracture itching.  He is off Myrbetriq now.  His urine remains clear and he is not having any symptoms to suggest bladder spasms.  He has report continued drainage from perineal lesion.  He reports he was scheduled for surgery of this area in February.  Objective: Vital signs in last 24 hours: Temp:  [98.1 F (36.7 C)-98.9 F (37.2 C)] 98.2 F (36.8 C) (10/29 0621) Pulse Rate:  [74-80] 76 (10/29 0621) Resp:  [15-25] 15 (10/29 0621) BP: (154-180)/(64-88) 162/66 (10/29 0621) SpO2:  [93 %-97 %] 96 % (10/29 0621)A  Intake/Output from previous day: 10/28 0701 - 10/29 0700 In: -  Out: 5125 [Urine:5125] Intake/Output this shift: No intake/output data recorded.  Past Medical History:  Diagnosis Date  . Allergic rhinitis   . At risk for sleep apnea    STOP-BANG= 5   SENT TO PCP 03-14-2014  . Cataract    surgically removed bilateral  . Condyloma acuminatum of penis   . Diabetic neuropathy ([place])   . GERD (gastroesophageal reflux disease)   . History of bladder cancer    s/p  turbt  2013/   transitional cell carcinoma--   . History of condyloma acuminatum    PERINEAL AREA  W/ RECURRENCY  . History of gout   . Hyperlipidemia   . Hypertension   . Lower urinary tract symptoms (LUTS)   . Productive cough   . PVD (peripheral vascular disease) with claudication (H[facility])     bilateral SFA disease-- right > left  and left tibial artery disease--  per duplex  . Smokers' cough ([place])   . Type 2 diabetes mellitus with insulin therapy ([place])    monitor by  dr ellsion  . Wears dentures    upper    Physical Exam:  General: Awake, alert and in no apparent distress Lungs: Normal respiratory effort, chest expands symmetrically.  Abdomen: Soft, non-tender & non-distended. GU:   Foley catheter in place and draining clear urine.  Lab Results: Recent Labs    06/07/19 0929 06/08/19 0206 06/09/19 0605  WBC 12.6* 10.3 12.5*  HGB 12.7* 12.6* 13.4  HCT 37.5* 37.7* 39.1   BMET Recent Labs    06/08/19 0206 06/09/19 0605  NA 135 138  K 3.4* 3.7  CL 103 103  CO2 23 26  GLUCOSE 189* 152*  BUN 19 20  CREATININE 1.10 1.14  CALCIUM 8.0* 9.0   No results for input(s): LABURIN in the last 72 hours. Results for orders placed or performed during the hospital encounter of 06/04/19  Culture, blood (Routine x 2)     Status: None (Preliminary result)   Collection Time: 06/04/19 11:22 PM   Specimen: BLOOD  Result Value Ref Range Status   Specimen Description   Final  BLOOD RIGHT ANTECUBITAL Performed at [facility] [address] [place], [place] [address]    Special Requests   Final    BOTTLES DRAWN AEROBIC AND ANAEROBIC Blood Culture adequate volume Performed at [facility] [address], [place], [place] [address]    Culture   Final    NO GROWTH 3 DAYS Performed at [facility], [place] [address] [place], [place] [address]    Report Status PENDING  Incomplete  Culture, blood (Routine x 2)     Status: None (Preliminary result)   Collection Time: 06/04/19 11:22 PM   Specimen: BLOOD  Result Value Ref Range Status   Specimen Description   Final    BLOOD LEFT ANTECUBITAL Performed at [facility] [address] [place], [place] [address]    Special Requests   Final    BOTTLES DRAWN  AEROBIC AND ANAEROBIC Blood Culture adequate volume Performed at [facility] [address], [place], [place] [address]    Culture   Final    NO GROWTH 3 DAYS Performed at [facility], [place] [address], [place], [place] [address]    Report Status PENDING  Incomplete  Urine culture     Status: Abnormal   Collection Time: 06/05/19 12:41 AM   Specimen: In/Out Cath Urine  Result Value Ref Range Status   Specimen Description   Final    IN/OUT CATH URINE Performed at [facility] [address] [place],  [address]    Special Requests   Final    NONE Performed at [facility], [place] [address] [place], [place] [address]    Culture 50,000 COLONIES/mL ESCHERICHIA COLI (A)  Final   Report Status 06/08/2019 FINAL  Final   Organism ID, Bacteria ESCHERICHIA COLI (A)  Final      Susceptibility   Escherichia coli - MIC*    AMPICILLIN >=32 RESISTANT Resistant     CEFAZOLIN 8 SENSITIVE Sensitive     CEFTRIAXONE <=1 SENSITIVE Sensitive     CIPROFLOXACIN <=0.25 SENSITIVE Sensitive     GENTAMICIN <=1 SENSITIVE Sensitive     IMIPENEM <=0.25 SENSITIVE Sensitive     NITROFURANTOIN <=16 SENSITIVE Sensitive     TRIMETH/SULFA <=20 SENSITIVE Sensitive     AMPICILLIN/SULBACTAM 16 INTERMEDIATE Intermediate     PIP/TAZO <=4 SENSITIVE Sensitive     Extended ESBL NEGATIVE Sensitive     * 50,000 COLONIES/mL ESCHERICHIA COLI  SARS Coronavirus 2 by RT PCR (hospital order, performed in [facility] hospital lab) Nasopharyngeal Nasopharyngeal Swab     Status: None   Collection Time: 06/05/19  1:10 AM   Specimen: Nasopharyngeal Swab  Result Value Ref Range Status   SARS Coronavirus 2 NEGATIVE NEGATIVE Final    Comment: (NOTE) If result is NEGATIVE SARS-CoV-2 target nucleic acids are NOT DETECTED. The SARS-CoV-2 RNA is generally detectable in upper and lower  respiratory specimens during the acute phase of infection. The lowest   concentration of SARS-CoV-2 viral copies this assay can detect is 250  copies / mL. A negative result does not preclude SARS-CoV-2 infection  and should not be used as the sole basis for treatment or other  patient management decisions.  A negative result may occur with  improper specimen collection / handling, submission of specimen other  than nasopharyngeal swab, presence of viral mutation(s) within the  areas targeted by this assay, and inadequate number of viral copies  (<250 copies /  mL). A negative result must be combined with clinical  observations, patient history, and epidemiological information. If result is POSITIVE SARS-CoV-2 target nucleic acids are DETECTED. The SARS-CoV-2 RNA is generally detectable in upper and lower  respiratory specimens dur ing the acute phase of infection.  Positive  results are indicative of active infection with SARS-CoV-2.  Clinical  correlation with patient history and other diagnostic information is  necessary to determine patient infection status.  Positive results do  not rule out bacterial infection or co-infection with other viruses. If result is PRESUMPTIVE POSTIVE SARS-CoV-2 nucleic acids MAY BE PRESENT.   A presumptive positive result was obtained on the submitted specimen  and confirmed on repeat testing.  While 2019 novel coronavirus  (SARS-CoV-2) nucleic acids may be present in the submitted sample  additional confirmatory testing may be necessary for epidemiological  and / or clinical management purposes  to differentiate between  SARS-CoV-2 and other Sarbecovirus currently known to infect humans.  If clinically indicated additional testing with an alternate test  methodology [phone]) is advised. The SARS-CoV-2 RNA is generally  detectable in upper and lower respiratory sp ecimens during the acute  phase of infection. The expected result is Negative. Fact Sheet for Patients:  StrictlyIdeas.no Fact Sheet  for Healthcare Providers: BankingDealers.co.za This test is not yet approved or cleared by the [place] FDA and has been authorized for detection and/or diagnosis of SARS-CoV-2 by FDA under an Emergency Use Authorization (EUA).  This EUA will remain in effect (meaning this test can be used) for the duration of the COVID-19 declaration under Section 564(b)(1) of the Act, 21 U.S.C. section 360bbb-3(b)(1), unless the authorization is terminated or revoked sooner. Performed at [facility], [place] [address] [place], [place] [address]   MRSA PCR Screening     Status: None   Collection Time: 06/05/19 10:33 AM   Specimen: Nasal Mucosa; Nasopharyngeal  Result Value Ref Range Status   MRSA by PCR NEGATIVE NEGATIVE Final    Comment:        The GeneXpert MRSA Assay (FDA approved for NASAL specimens only), is one component of a comprehensive MRSA colonization surveillance program. It is not intended to diagnose MRSA infection nor to guide or monitor treatment for MRSA infections. Performed at [facility], [place] [address], [place], [place] [address]   C difficile quick scan w PCR reflex     Status: None   Collection Time: 06/05/19  4:26 PM   Specimen: STOOL  Result Value Ref Range Status   C Diff antigen NEGATIVE NEGATIVE Final   C Diff toxin NEGATIVE NEGATIVE Final   C Diff interpretation No C. difficile detected.  Final    Comment: Performed at [facility], [place] [address], [place], [place] [address]  GI pathogen panel by PCR, stool     Status: None   Collection Time: 06/05/19  4:26 PM   Specimen: STOOL  Result Value Ref Range Status   Plesiomonas shigelloides NOT DETECTED NOT DETECTED Final   Yersinia enterocolitica NOT DETECTED NOT DETECTED Final   Vibrio NOT DETECTED NOT DETECTED Final   Enteropathogenic E coli NOT DETECTED NOT DETECTED Final   E coli (ETEC) LT/ST NOT DETECTED NOT DETECTED Final    E coli [id number] by PCR Not applicable NOT DETECTED Final   Cryptosporidium by PCR NOT DETECTED NOT DETECTED Final   Entamoeba histolytica NOT DETECTED NOT DETECTED Final   Adenovirus F 40/41 NOT DETECTED NOT DETECTED Final  Norovirus GI/GII NOT DETECTED NOT DETECTED Final   Sapovirus NOT DETECTED NOT DETECTED Final    Comment: (NOTE) Performed At: [facility] [place], [place] [id number] Rush Farmer MD [id number]    Vibrio cholerae NOT DETECTED NOT DETECTED Final   Campylobacter by PCR NOT DETECTED NOT DETECTED Final   Salmonella by PCR NOT DETECTED NOT DETECTED Final   E coli (STEC) NOT DETECTED NOT DETECTED Final   Enteroaggregative E coli NOT DETECTED NOT DETECTED Final   Shigella by PCR NOT DETECTED NOT DETECTED Final   Cyclospora cayetanensis NOT DETECTED NOT DETECTED Final   Astrovirus NOT DETECTED NOT DETECTED Final   G lamblia by PCR NOT DETECTED NOT DETECTED Final   Rotavirus A by PCR NOT DETECTED NOT DETECTED Final    Studies/Results: No results found.    Thana Farr Roselina Burgueno 06/09/2019, 7:21 AM

## 2019-06-09 NOTE — Care Management Important Message (Signed)
Important Message  Patient Details IM Letter given to [facility] SW to present to the Patient Name: [name] MRN: [id number] Date of Birth: 23-Apr-1956   Medicare Important Message Given:  Yes     Kerin Salen 06/09/2019, 10:52 AM

## 2019-06-09 NOTE — Progress Notes (Signed)
Pts IV removed with a clean and dry dressing intact, pt denies pain at the time of d/c with no s/s of distress noted. Pt educated on d/c instructions, follow ups, and medications with all pt questions answered at that time.

## 2019-06-09 NOTE — Progress Notes (Signed)
   [facility] will sign off.   Medication Recommendations:  I would suggest restarting his previous meds at discharge for BP control.   Other recommendations (labs, testing, etc):  None Follow up as an outpatient:  Can follow with Dr. Fletcher Anon.   We will arrange.

## 2019-06-09 NOTE — Telephone Encounter (Signed)
Called pt's wife and gave her MD message. Pt's wife stated that he got out of the hospital yesterday, so pt was scheduled for post hospital f/u appt for Monday. Pt's wife was advised to remember to bring glucometer to visit. She verbalized understanding.

## 2019-06-09 NOTE — Discharge Summary (Signed)
Physician Discharge Summary  SQUARE ENSINGER [id number] DOB: [birth year] DOA: 06/04/2019  PCP: Biagio Borg, MD  Admit date: 06/04/2019 Discharge date: 06/09/2019  Admitted From: Home Disposition: Home  Recommendations for Outpatient Follow-up:  1. Follow up with PCP in 1-2 weeks 2. Please obtain BMP/CBC in one week Please follow up with Dr. Karsten Ro urology Home Health: None Equipment/Devices none  Discharge Condition: Stable CODE STATUS: Full code  diet recommendation cardiac carb modified Brief/Interim Summary:63 year old male with a history of diabetes mellitus, hypertension,PVD,Chronic draining pilonidal cyst, transitional cellbladder CA s/p resection in 2013, whofollows Urologyunderwent yearly surveillenacecystoscopy on Thursday 10/22 by Dr. Karsten Ro. The following day he started to feel sick, developed fever/ chills, loss of appetite which progressively worsened to include shortness of breath on the day of presentation to the ED. He did not have any c/o chest pain or hematuria.  ED course: BP was significantly elevated on presentation, labs revealed leucocytosis of 21 k, hypokalemia, hypomagnesemia, abnormal U/A and troponin elevation at 162-->305 with non specific EKG changes. CT chest -ve for PE. CT abd/pelvis revealed perinephric stranding. Code sepsis was initiated in ED and patient admitted to [facility] with empiric antibiotics(Vanc-cefepime-flagyl) for complicated UTI/sepsis. He required IV hydralazine doses to control BP in the ED. Hospital course:Febrile with Tmax 103.5 on 10/25. HR 55-135. Also having diarrhea now. C.diff sent.Patient evaluated by urology and recommended to continue abx for sepsis. Seen by cardiology who felt patient's c/o shortness of breath and elevated troponin on admission likely related to uncontrolled BP and sepsis. Repeat echomod LVH with preserved EF. Blood cx and urine cx so far no growth reported. Troponin trended 162->509->741->666  Discharge  Diagnoses:  Active Problems:   Essential hypertension   Asthma   Diabetes ([place])   Sepsis secondary to UTI ([place])  1. Sepsis/pyelonephritis : Patient was initially treated with Vanco and cefepime.  He was hypotensive on admission treated with IV fluids.  Urine culture grew more than 50,000 colonies of E. coli.  Blood culture remained negative.  Leukocytosis improved with IV antibiotics.  Foley catheter was DC'd today 06/09/2019 prior to discharge.  Patient be discharged on Bactrim for 4 more days.    2. HTNsive Urgency/PSVT's: Will restart metoprolol 25 mg twice a day.  Continue Norvasc.  Continue HCTZ as you are doing at home.   3. Troponemia : Patient was seen by cardiology.  He had nonspecific ST-T wave changes in the EKG. Atherosclerosis reported CT scan.  Echo shows mod LVH, EF 60-65%. Continue asa/statins/b-blockers.   Follow-up with his outpatient cardiologist.  4. Severe Hypokalemia/hypomagnesemia:Replaced IV and oral mag/potassium.   Continue potassium as an outpatient.  5. Diabetes Mellitus: On large doses of insulin at home (250 U).  This was decreased to 50 units twice a day with blood sugar ranging from 89-2 34.  We will continue the same dose on discharge.  Follow-up with endocrinology.    6.  Drug rash: Could be delayed drug rash from cephalosporins.  Doubt if metoprolol is the real culprit.  DC Myrbetriq.  I have restarted metoprolol since he needs a rate control agent.  Doubt if metoprolol was a culprit for rash.  7.Diarrhea: Stool C.diff -ve.  Denies any diarrhea.   8. Dyspnea: O2 sats 90-97%. CT chest -ve for PE.  Now off IV fluids.  Patient does have obese abdomen and likely has restrictive lung disease/atelectasis due to poor expansion.   9.Bladder ca: s/p resection. No abnormal findings on recent surveillance cystoscopy.  Foley catheter DC'd by  urology 06/09/2019   10. PVD: on asa/statins  Estimated body mass index is 39.69 kg/m as calculated from the  following:   Height as of this encounter: 5\' 8"  (1.727 m).   Weight as of this encounter: 118.4 kg.  Discharge Instructions  Discharge Instructions    Call MD for:  persistant dizziness or light-headedness   Complete by: As directed    Call MD for:  persistant nausea and vomiting   Complete by: As directed    Call MD for:  severe uncontrolled pain   Complete by: As directed    Diet - low sodium heart healthy   Complete by: As directed    Increase activity slowly   Complete by: As directed      Allergies as of 06/09/2019      Reactions   Cefepime Hives   Had allergic reaction to one of these 3 agents, unclear which one   Metoprolol Hives   Had allergic reaction to one of these 3 agents, unclear which one *Per RN, highly likely this is the cause of allergic rxn   Myrbetriq [mirabegron] Hives   Had allergic reaction to one of these 3 agents, unclear which one      Medication List    STOP taking these medications   Insulin Glargine 100 UNIT/ML Solostar Pen Commonly known as: Lantus SoloStar Replaced by: insulin glargine 100 UNIT/ML injection   metFORMIN 500 MG 24 hr tablet Commonly known as: GLUCOPHAGE-XR     TAKE these medications   acetaminophen 500 MG tablet Commonly known as: TYLENOL Take 500 mg by mouth every 6 (six) hours as needed.   amLODipine 2.5 MG tablet Commonly known as: NORVASC Take 1 tablet (2.5 mg total) by mouth daily.   aspirin EC 81 MG tablet Take 1 tablet (81 mg total) by mouth daily.   atorvastatin 20 MG tablet Commonly known as: LIPITOR Take 1 tablet (20 mg total) by mouth daily.   hydrochlorothiazide 25 MG tablet Commonly known as: HYDRODIURIL Take 1 tablet (25 mg total) by mouth daily.   insulin aspart 100 UNIT/ML injection Commonly known as: novoLOG Inject 3 Units into the skin 3 (three) times daily with meals.   insulin glargine 100 UNIT/ML injection Commonly known as: LANTUS Inject 0.5 mLs (50 Units total) into the skin 2 (two)  times daily. Replaces: Insulin Glargine 100 UNIT/ML Solostar Pen   loratadine 10 MG tablet Commonly known as: CLARITIN Take 10 mg by mouth daily.   losartan 50 MG tablet Commonly known as: COZAAR Take 1 tablet (50 mg total) by mouth daily. What changed:   medication strength  how much to take   pantoprazole 40 MG tablet Commonly known as: PROTONIX Take 1 tablet (40 mg total) by mouth daily.   Potassium Chloride ER 20 MEQ Tbcr Take 20 mEq by mouth daily.   sulfamethoxazole-trimethoprim 800-160 MG tablet Commonly known as: BACTRIM DS Take 1 tablet by mouth every 12 (twelve) hours.   Vitamin D (Ergocalciferol) 1.25 MG (50000 UT) Caps capsule Commonly known as: DRISDOL Take 1 capsule (50,000 Units total) by mouth every 7 (seven) days. What changed: when to take this      Follow-up Information    Biagio Borg, MD Follow up.   Specialties: Internal Medicine, Radiology Contact information: [address] [place] [address] [phone]        Wellington Hampshire, MD .   Specialty: Cardiology Contact information: [place]  [id number] [id number]          Allergies  Allergen Reactions  . Cefepime Hives    Had allergic reaction to one of these 3 agents, unclear which one  . Metoprolol Hives    Had allergic reaction to one of these 3 agents, unclear which one  *Per RN, highly likely this is the cause of allergic rxn  . Myrbetriq [Mirabegron] Hives    Had allergic reaction to one of these 3 agents, unclear which one    Consultations:  Urology   Procedures/Studies: Ct Angio Chest Pe W And/or Wo Contrast  Result Date: 06/05/2019 CLINICAL DATA:  63 year old male with increased shortness of breath and fever. EXAM: CT ANGIOGRAPHY CHEST CT ABDOMEN AND PELVIS WITH CONTRAST TECHNIQUE: Multidetector CT imaging of the chest was performed using the standard protocol during bolus administration of intravenous contrast.  Multiplanar CT image reconstructions and MIPs were obtained to evaluate the vascular anatomy. Multidetector CT imaging of the abdomen and pelvis was performed using the standard protocol during bolus administration of intravenous contrast. CONTRAST:  85mL OMNIPAQUE IOHEXOL 350 MG/ML SOLN COMPARISON:  CT of the abdomen pelvis dated 05/01/2017 and chest radiograph dated 06/05/2019 FINDINGS: CTA CHEST FINDINGS Cardiovascular: There is no cardiomegaly or pericardial effusion. Coronary vascular calcification of the LAD. Mild atherosclerotic calcification of the thoracic aorta. No aneurysmal dilatation or dissection. Evaluation of the pulmonary arteries is very limited due to suboptimal opacification and timing of the contrast as well as respiratory motion artifact. No definite large central pulmonary artery embolus identified. Mediastinum/Nodes: There is no hilar or mediastinal adenopathy. The esophagus is grossly unremarkable. No mediastinal fluid collection. Lungs/Pleura: Minimal left lung base linear atelectasis/scarring. No focal consolidation, pleural effusion, or pneumothorax. The central airways are patent. Musculoskeletal: No acute osseous pathology. Review of the MIP images confirms the above findings. CT ABDOMEN and PELVIS FINDINGS No intra-abdominal free air or free fluid. Hepatobiliary: Diffuse fatty infiltration of the liver. No intrahepatic biliary ductal dilatation. There is sludge or noncalcified stone in the gallbladder. No pericholecystic fluid or evidence of acute cholecystitis by CT. Pancreas: Unremarkable. No pancreatic ductal dilatation or surrounding inflammatory changes. Spleen: Normal in size without focal abnormality. Adrenals/Urinary Tract: The adrenal glands are unremarkable. There is no hydronephrosis on either side. There is symmetric enhancement and excretion of contrast by both kidneys. The visualized ureters and urinary bladder appear unremarkable. Minimal bilateral perinephric stranding,  nonspecific. Correlation with urinalysis recommended to exclude UTI. Stomach/Bowel: There is sigmoid diverticulosis without active inflammatory changes. There is no bowel obstruction or active inflammation. The appendix is normal. Vascular/Lymphatic: Advanced aortoiliac atherosclerotic disease. The IVC is unremarkable. No portal venous gas. There is no adenopathy. Reproductive: Enlarged prostate gland measuring 6 cm in transverse axial diameter. The seminal vesicles are symmetric. Other: None Musculoskeletal: Degenerative changes of the spine. No acute osseous pathology. Review of the MIP images confirms the above findings. IMPRESSION: 1. No acute intrathoracic, abdominal, or pelvic pathology. No CT evidence of central pulmonary artery embolus. 2. Fatty liver. 3. Sigmoid diverticulosis. No bowel obstruction or active inflammation. Normal appendix. 4. Gallbladder sludge or noncalcified stones. Aortic Atherosclerosis (ICD10-I70.0). Electronically Signed   By: Anner Crete M.D.   On: 06/05/2019 01:54   Ct Abdomen Pelvis W Contrast  Result Date: 06/05/2019 CLINICAL DATA:  63 year old male with increased shortness of breath and fever. EXAM: CT ANGIOGRAPHY CHEST CT ABDOMEN AND PELVIS WITH CONTRAST TECHNIQUE: Multidetector CT imaging of the chest was performed using the standard protocol during bolus administration of intravenous  contrast. Multiplanar CT image reconstructions and MIPs were obtained to evaluate the vascular anatomy. Multidetector CT imaging of the abdomen and pelvis was performed using the standard protocol during bolus administration of intravenous contrast. CONTRAST:  39mL OMNIPAQUE IOHEXOL 350 MG/ML SOLN COMPARISON:  CT of the abdomen pelvis dated 05/01/2017 and chest radiograph dated 06/05/2019 FINDINGS: CTA CHEST FINDINGS Cardiovascular: There is no cardiomegaly or pericardial effusion. Coronary vascular calcification of the LAD. Mild atherosclerotic calcification of the thoracic aorta. No  aneurysmal dilatation or dissection. Evaluation of the pulmonary arteries is very limited due to suboptimal opacification and timing of the contrast as well as respiratory motion artifact. No definite large central pulmonary artery embolus identified. Mediastinum/Nodes: There is no hilar or mediastinal adenopathy. The esophagus is grossly unremarkable. No mediastinal fluid collection. Lungs/Pleura: Minimal left lung base linear atelectasis/scarring. No focal consolidation, pleural effusion, or pneumothorax. The central airways are patent. Musculoskeletal: No acute osseous pathology. Review of the MIP images confirms the above findings. CT ABDOMEN and PELVIS FINDINGS No intra-abdominal free air or free fluid. Hepatobiliary: Diffuse fatty infiltration of the liver. No intrahepatic biliary ductal dilatation. There is sludge or noncalcified stone in the gallbladder. No pericholecystic fluid or evidence of acute cholecystitis by CT. Pancreas: Unremarkable. No pancreatic ductal dilatation or surrounding inflammatory changes. Spleen: Normal in size without focal abnormality. Adrenals/Urinary Tract: The adrenal glands are unremarkable. There is no hydronephrosis on either side. There is symmetric enhancement and excretion of contrast by both kidneys. The visualized ureters and urinary bladder appear unremarkable. Minimal bilateral perinephric stranding, nonspecific. Correlation with urinalysis recommended to exclude UTI. Stomach/Bowel: There is sigmoid diverticulosis without active inflammatory changes. There is no bowel obstruction or active inflammation. The appendix is normal. Vascular/Lymphatic: Advanced aortoiliac atherosclerotic disease. The IVC is unremarkable. No portal venous gas. There is no adenopathy. Reproductive: Enlarged prostate gland measuring 6 cm in transverse axial diameter. The seminal vesicles are symmetric. Other: None Musculoskeletal: Degenerative changes of the spine. No acute osseous pathology.  Review of the MIP images confirms the above findings. IMPRESSION: 1. No acute intrathoracic, abdominal, or pelvic pathology. No CT evidence of central pulmonary artery embolus. 2. Fatty liver. 3. Sigmoid diverticulosis. No bowel obstruction or active inflammation. Normal appendix. 4. Gallbladder sludge or noncalcified stones. Aortic Atherosclerosis (ICD10-I70.0). Electronically Signed   By: Anner Crete M.D.   On: 06/05/2019 01:54   Dg Chest Portable 1 View  Result Date: 06/05/2019 CLINICAL DATA:  63 year old male with shortness of breath. EXAM: PORTABLE CHEST 1 VIEW COMPARISON:  Chest radiograph dated 10/02/2014 FINDINGS: There is blunting of the left costophrenic angle which may represent trace left pleural effusion. Left lung base densities, likely atelectatic changes. Developing infiltrate is not excluded. Clinical correlation is recommended. The right lung is clear. There is no pneumothorax. The cardiac silhouette is within normal limits. No acute osseous pathology. IMPRESSION: Probable trace left pleural effusion and left lung base atelectasis. Developing infiltrate is not excluded. Clinical correlation recommended. Electronically Signed   By: Anner Crete M.D.   On: 06/05/2019 00:17    (Echo, Carotid, EGD, Colonoscopy, ERCP)    Subjective: Patient is awake alert sitting up in bed eating breakfast no new complaints denies diarrhea vomiting  Discharge Exam: Vitals:   06/08/19 2258 06/09/19 0621  BP: (!) 158/66 (!) 162/66  Pulse:  76  Resp:  15  Temp:  98.2 F (36.8 C)  SpO2:  96%   Vitals:   06/08/19 1340 06/08/19 1936 06/08/19 2258 06/09/19 0621  BP: (!) 154/64 Marland Kitchen)  179/77 (!) 158/66 (!) 162/66  Pulse: 74 78  76  Resp: 20 18  15   Temp: 98.4 F (36.9 C) 98.1 F (36.7 C)  98.2 F (36.8 C)  TempSrc:  Oral  Oral  SpO2: 97% 96%  96%  Weight:      Height:        General: Pt is alert, awake, not in acute distress Cardiovascular: RRR, S1/S2 +, no rubs, no  gallops Respiratory: CTA bilaterally, no wheezing, no rhonchi Abdominal: Soft, NT, distended, bowel sounds + Extremities: no edema, no cyanosis    The results of significant diagnostics from this hospitalization (including imaging, microbiology, ancillary and laboratory) are listed below for reference.     Microbiology: Recent Results (from the past 240 hour(s))  Culture, blood (Routine x 2)     Status: None (Preliminary result)   Collection Time: 06/04/19 11:22 PM   Specimen: BLOOD  Result Value Ref Range Status   Specimen Description   Final    BLOOD RIGHT ANTECUBITAL Performed at [facility] [address] [place], [place] [address]    Special Requests   Final    BOTTLES DRAWN AEROBIC AND ANAEROBIC Blood Culture adequate volume Performed at [facility] [address] [place], [place] [address]    Culture   Final    NO GROWTH 4 DAYS Performed at [facility], [place] [address] [place], [place] [address]    Report Status PENDING  Incomplete  Culture, blood (Routine x 2)     Status: None (Preliminary result)   Collection Time: 06/04/19 11:22 PM   Specimen: BLOOD  Result Value Ref Range Status   Specimen Description   Final    BLOOD LEFT ANTECUBITAL Performed at [facility] [address] [place], [place] [address]    Special Requests   Final    BOTTLES DRAWN AEROBIC AND ANAEROBIC Blood Culture adequate volume Performed at [facility] [address] [place], [place] [address]    Culture   Final    NO GROWTH 4 DAYS Performed at [facility], [place] [address], [place], [place] [address]    Report Status PENDING  Incomplete  Urine culture     Status: Abnormal   Collection Time: 06/05/19 12:41 AM   Specimen: In/Out Cath Urine  Result Value Ref Range Status   Specimen Description   Final    IN/OUT CATH URINE Performed at [facility] [address],  [place], [place] [address]    Special Requests   Final    NONE Performed at [facility], [place] [address], [place], [place] [address]    Culture 50,000 COLONIES/mL ESCHERICHIA COLI (A)  Final   Report Status 06/08/2019 FINAL  Final   Organism ID, Bacteria ESCHERICHIA COLI (A)  Final      Susceptibility   Escherichia coli - MIC*    AMPICILLIN >=32 RESISTANT Resistant     CEFAZOLIN 8 SENSITIVE Sensitive     CEFTRIAXONE <=1 SENSITIVE Sensitive     CIPROFLOXACIN <=0.25 SENSITIVE Sensitive     GENTAMICIN <=1 SENSITIVE Sensitive     IMIPENEM <=0.25 SENSITIVE Sensitive     NITROFURANTOIN <=16 SENSITIVE Sensitive     TRIMETH/SULFA <=20 SENSITIVE Sensitive     AMPICILLIN/SULBACTAM 16 INTERMEDIATE Intermediate     PIP/TAZO <=4 SENSITIVE Sensitive     Extended ESBL NEGATIVE Sensitive     * 50,000 COLONIES/mL ESCHERICHIA COLI  SARS Coronavirus  2 by RT PCR (hospital order, performed in [facility] hospital lab) Nasopharyngeal Nasopharyngeal Swab     Status: None   Collection Time: 06/05/19  1:10 AM   Specimen: Nasopharyngeal Swab  Result Value Ref Range Status   SARS Coronavirus 2 NEGATIVE NEGATIVE Final    Comment: (NOTE) If result is NEGATIVE SARS-CoV-2 target nucleic acids are NOT DETECTED. The SARS-CoV-2 RNA is generally detectable in upper and lower  respiratory specimens during the acute phase of infection. The lowest  concentration of SARS-CoV-2 viral copies this assay can detect is 250  copies / mL. A negative result does not preclude SARS-CoV-2 infection  and should not be used as the sole basis for treatment or other  patient management decisions.  A negative result may occur with  improper specimen collection / handling, submission of specimen other  than nasopharyngeal swab, presence of viral mutation(s) within the  areas targeted by this assay, and inadequate number of viral copies  (<250 copies / mL). A negative result must be combined with clinical   observations, patient history, and epidemiological information. If result is POSITIVE SARS-CoV-2 target nucleic acids are DETECTED. The SARS-CoV-2 RNA is generally detectable in upper and lower  respiratory specimens dur ing the acute phase of infection.  Positive  results are indicative of active infection with SARS-CoV-2.  Clinical  correlation with patient history and other diagnostic information is  necessary to determine patient infection status.  Positive results do  not rule out bacterial infection or co-infection with other viruses. If result is PRESUMPTIVE POSTIVE SARS-CoV-2 nucleic acids MAY BE PRESENT.   A presumptive positive result was obtained on the submitted specimen  and confirmed on repeat testing.  While 2019 novel coronavirus  (SARS-CoV-2) nucleic acids may be present in the submitted sample  additional confirmatory testing may be necessary for epidemiological  and / or clinical management purposes  to differentiate between  SARS-CoV-2 and other Sarbecovirus currently known to infect humans.  If clinically indicated additional testing with an alternate test  methodology [phone]) is advised. The SARS-CoV-2 RNA is generally  detectable in upper and lower respiratory sp ecimens during the acute  phase of infection. The expected result is Negative. Fact Sheet for Patients:  StrictlyIdeas.no Fact Sheet for Healthcare Providers: BankingDealers.co.za This test is not yet approved or cleared by the [place] FDA and has been authorized for detection and/or diagnosis of SARS-CoV-2 by FDA under an Emergency Use Authorization (EUA).  This EUA will remain in effect (meaning this test can be used) for the duration of the COVID-19 declaration under Section 564(b)(1) of the Act, 21 U.S.C. section 360bbb-3(b)(1), unless the authorization is terminated or revoked sooner. Performed at [facility], [place]  [address], [place], [place] [address]   MRSA PCR Screening     Status: None   Collection Time: 06/05/19 10:33 AM   Specimen: Nasal Mucosa; Nasopharyngeal  Result Value Ref Range Status   MRSA by PCR NEGATIVE NEGATIVE Final    Comment:        The GeneXpert MRSA Assay (FDA approved for NASAL specimens only), is one component of a comprehensive MRSA colonization surveillance program. It is not intended to diagnose MRSA infection nor to guide or monitor treatment for MRSA infections. Performed at [facility], [place] [address], [place], [place] [address]   C difficile quick scan w PCR reflex     Status: None   Collection Time: 06/05/19  4:26 PM   Specimen: STOOL  Result Value Ref Range Status   C Diff antigen NEGATIVE NEGATIVE Final   C Diff toxin NEGATIVE NEGATIVE Final   C Diff interpretation No C. difficile detected.  Final    Comment: Performed at [facility], [place] [address], [place],  [address]  GI pathogen panel by PCR, stool     Status: None   Collection Time: 06/05/19  4:26 PM   Specimen: STOOL  Result Value Ref Range Status   Plesiomonas shigelloides NOT DETECTED NOT DETECTED Final   Yersinia enterocolitica NOT DETECTED NOT DETECTED Final   Vibrio NOT DETECTED NOT DETECTED Final   Enteropathogenic E coli NOT DETECTED NOT DETECTED Final   E coli (ETEC) LT/ST NOT DETECTED NOT DETECTED Final   E coli [id number] by PCR Not applicable NOT DETECTED Final   Cryptosporidium by PCR NOT DETECTED NOT DETECTED Final   Entamoeba histolytica NOT DETECTED NOT DETECTED Final   Adenovirus F 40/41 NOT DETECTED NOT DETECTED Final   Norovirus GI/GII NOT DETECTED NOT DETECTED Final   Sapovirus NOT DETECTED NOT DETECTED Final    Comment: (NOTE) Performed At: [facility] [place], [place] [id number] Rush Farmer MD [id number]    Vibrio cholerae NOT DETECTED NOT DETECTED Final   Campylobacter by PCR NOT DETECTED NOT  DETECTED Final   Salmonella by PCR NOT DETECTED NOT DETECTED Final   E coli (STEC) NOT DETECTED NOT DETECTED Final   Enteroaggregative E coli NOT DETECTED NOT DETECTED Final   Shigella by PCR NOT DETECTED NOT DETECTED Final   Cyclospora cayetanensis NOT DETECTED NOT DETECTED Final   Astrovirus NOT DETECTED NOT DETECTED Final   G lamblia by PCR NOT DETECTED NOT DETECTED Final   Rotavirus A by PCR NOT DETECTED NOT DETECTED Final     Labs: BNP (last 3 results) Recent Labs    06/04/19 2318  BNP 0000000*   Basic Metabolic Panel: Recent Labs  Lab 06/04/19 2318 06/04/19 2336 06/04/19 2348 06/05/19 1047 06/05/19 1930 06/06/19 0249 06/07/19 0158 06/08/19 0206 06/09/19 0605  NA 136 137  --  138  --  138  --  135 138  K 2.5* 2.5* 2.8* 2.9* 3.1* 3.8  --  3.4* 3.7  CL 99 98  --  104  --  107  --  103 103  CO2 24  --   --  21*  --  20*  --  23 26  GLUCOSE 104* 96  --  177*  --  117*  --  189* 152*  BUN 20 19  --  16  --  21  --  19 20  CREATININE 1.61* 1.60*  --  1.27*  --  1.39* 1.28* 1.10 1.14  CALCIUM 8.1*  --   --  7.7*  --  7.8*  --  8.0* 9.0  MG  --   --  1.1* 1.2* 2.4  --   --   --   --    Liver Function Tests: Recent Labs  Lab 06/04/19 2318 06/05/19 1047  AST 48* 192*  ALT 28 36  ALKPHOS 64 62  BILITOT 1.5* 1.1  PROT 7.3 6.6  ALBUMIN 3.4* 3.1*   No results for input(s): LIPASE, AMYLASE in the last 168 hours. No results for input(s): AMMONIA in the last 168 hours. CBC: Recent Labs  Lab 06/04/19 2318  06/05/19 1047 06/06/19 0249 06/07/19 0929 06/08/19 0206 06/09/19 0605  WBC 21.0*  --  22.1* 20.3* 12.6* 10.3 12.5*  NEUTROABS 18.0*  --  18.9*  --   --   --   --   HGB 14.1   < > 13.3 12.8* 12.7* 12.6* 13.4  HCT 40.7   < > 37.9* 37.9* 37.5* 37.7* 39.1  MCV 91.7  --  91.3 93.3 93.5 93.5 92.7  PLT 218  --  182 172 181 178 259   < > = values in this interval not displayed.   Cardiac Enzymes: No results for input(s): CKTOTAL, CKMB, CKMBINDEX, TROPONINI in the  last 168 hours. BNP: Invalid input(s): POCBNP CBG: Recent Labs  Lab 06/08/19 0900 06/08/19 1236 06/08/19 1624 06/08/19 1939 06/09/19 0738  GLUCAP 89 234* 185* 234* 129*   D-Dimer No results for input(s): DDIMER in the last 72 hours. Hgb A1c Recent Labs    06/08/19 0203  HGBA1C 8.9*   Lipid Profile No results for input(s): CHOL, HDL, LDLCALC, TRIG, CHOLHDL, LDLDIRECT in the last 72 hours. Thyroid function studies No results for input(s): TSH, T4TOTAL, T3FREE, THYROIDAB in the last 72 hours.  Invalid input(s): FREET3 Anemia work up No results for input(s): VITAMINB12, FOLATE, FERRITIN, TIBC, IRON, RETICCTPCT in the last 72 hours. Urinalysis    Component Value Date/Time   COLORURINE YELLOW 06/04/2019 2323   APPEARANCEUR CLEAR 06/04/2019 2323   LABSPEC 1.008 06/04/2019 2323   PHURINE 6.0 06/04/2019 2323   GLUCOSEU NEGATIVE 06/04/2019 2323   GLUCOSEU 250 (A) 04/21/2019 1041   HGBUR LARGE (A) 06/04/2019 2323   BILIRUBINUR NEGATIVE 06/04/2019 2323   KETONESUR NEGATIVE 06/04/2019 2323   PROTEINUR 100 (A) 06/04/2019 2323   UROBILINOGEN 1.0 04/21/2019 1041   NITRITE NEGATIVE 06/04/2019 2323   LEUKOCYTESUR MODERATE (A) 06/04/2019 2323   Sepsis Labs Invalid input(s): PROCALCITONIN,  WBC,  LACTICIDVEN Microbiology Recent Results (from the past 240 hour(s))  Culture, blood (Routine x 2)     Status: None (Preliminary result)   Collection Time: 06/04/19 11:22 PM   Specimen: BLOOD  Result Value Ref Range Status   Specimen Description   Final    BLOOD RIGHT ANTECUBITAL Performed at [facility], [place] [address], [place], [place] [address]    Special Requests   Final    BOTTLES DRAWN AEROBIC AND ANAEROBIC Blood Culture adequate volume Performed at [place] [address], [place], [place] [address]    Culture   Final    NO GROWTH 4 DAYS Performed at [facility], [place] [address] [place], [place] [address]    Report Status  PENDING  Incomplete  Culture, blood (Routine x 2)     Status: None (Preliminary result)   Collection Time: 06/04/19 11:22 PM   Specimen: BLOOD  Result Value Ref Range Status   Specimen Description   Final    BLOOD LEFT ANTECUBITAL Performed at [place] [address], [place], [place] [address]    Special Requests   Final    BOTTLES DRAWN AEROBIC AND ANAEROBIC Blood Culture adequate volume Performed at [place] [address], [place], [place] [address]    Culture   Final    NO GROWTH 4 DAYS Performed at [facility], [place] [address] [place], [place] [address]    Report Status PENDING  Incomplete  Urine culture     Status: Abnormal   Collection Time: 06/05/19 12:41 AM   Specimen: In/Out Cath Urine  Result Value Ref Range Status   Specimen Description   Final    IN/OUT CATH URINE Performed at [facility], [place] [address] [place], [place] [address]    Special Requests   Final    NONE Performed at [facility], [place] [address], [place], [place] [address]    Culture 50,000 COLONIES/mL ESCHERICHIA COLI (A)  Final   Report Status 06/08/2019 FINAL  Final   Organism ID, Bacteria ESCHERICHIA COLI (A)  Final      Susceptibility   Escherichia coli - MIC*    AMPICILLIN >=32 RESISTANT Resistant     CEFAZOLIN 8 SENSITIVE Sensitive     CEFTRIAXONE <=1 SENSITIVE Sensitive     CIPROFLOXACIN <=0.25 SENSITIVE Sensitive     GENTAMICIN <=1 SENSITIVE Sensitive     IMIPENEM <=0.25 SENSITIVE Sensitive     NITROFURANTOIN <=16 SENSITIVE Sensitive     TRIMETH/SULFA <=20 SENSITIVE Sensitive     AMPICILLIN/SULBACTAM 16 INTERMEDIATE Intermediate     PIP/TAZO <=4 SENSITIVE Sensitive     Extended ESBL NEGATIVE Sensitive     * 50,000 COLONIES/mL ESCHERICHIA COLI  SARS Coronavirus 2 by RT PCR (hospital order, performed in [place] hospital lab) Nasopharyngeal Nasopharyngeal Swab     Status: None   Collection  Time: 06/05/19  1:10 AM   Specimen: Nasopharyngeal Swab  Result Value Ref Range Status   SARS Coronavirus 2 NEGATIVE NEGATIVE Final    Comment: (NOTE) If result is NEGATIVE SARS-CoV-2 target nucleic acids are NOT DETECTED. The SARS-CoV-2 RNA is generally detectable in upper and lower  respiratory specimens during the acute phase of infection. The lowest  concentration of SARS-CoV-2 viral copies this assay can detect is 250  copies / mL. A negative result does not preclude SARS-CoV-2 infection  and should not be used as the sole basis for treatment or other  patient management decisions.  A negative result may occur with  improper specimen collection / handling, submission of specimen other  than nasopharyngeal swab, presence of viral mutation(s) within the  areas targeted by this assay, and inadequate number of viral copies  (<250 copies / mL). A negative result must be combined with clinical  observations, patient history, and epidemiological information. If result is POSITIVE SARS-CoV-2 target nucleic acids are DETECTED. The SARS-CoV-2 RNA is generally detectable in upper and lower  respiratory specimens dur ing the acute phase of infection.  Positive  results are indicative of active infection with SARS-CoV-2.  Clinical  correlation with patient history and other diagnostic information is  necessary to determine patient infection status.  Positive results do  not rule out bacterial infection or co-infection with other viruses. If result is PRESUMPTIVE POSTIVE SARS-CoV-2 nucleic acids MAY BE PRESENT.   A presumptive positive result was obtained on the submitted specimen  and confirmed on repeat testing.  While 2019 novel coronavirus  (SARS-CoV-2) nucleic acids may be present in the submitted sample  additional confirmatory testing may be necessary for epidemiological  and / or clinical management purposes  to differentiate between  SARS-CoV-2 and other Sarbecovirus currently known  to infect humans.  If clinically indicated additional testing with an alternate test  methodology [phone]) is advised. The SARS-CoV-2 RNA is generally  detectable in upper and lower respiratory sp ecimens during the acute  phase of infection. The expected result is Negative. Fact Sheet for Patients:  StrictlyIdeas.no Fact Sheet for Healthcare Providers: BankingDealers.co.za This test is not yet approved or cleared by the [place] FDA and has been authorized for detection and/or diagnosis of SARS-CoV-2 by FDA under an Emergency Use Authorization (EUA).  This EUA will  remain in effect (meaning this test can be used) for the duration of the COVID-19 declaration under Section 564(b)(1) of the Act, 21 U.S.C. section 360bbb-3(b)(1), unless the authorization is terminated or revoked sooner. Performed at [facility], [place] [address], [place], [place] [address]   MRSA PCR Screening     Status: None   Collection Time: 06/05/19 10:33 AM   Specimen: Nasal Mucosa; Nasopharyngeal  Result Value Ref Range Status   MRSA by PCR NEGATIVE NEGATIVE Final    Comment:        The GeneXpert MRSA Assay (FDA approved for NASAL specimens only), is one component of a comprehensive MRSA colonization surveillance program. It is not intended to diagnose MRSA infection nor to guide or monitor treatment for MRSA infections. Performed at [facility], [place] [address] [place], [place] [address]   C difficile quick scan w PCR reflex     Status: None   Collection Time: 06/05/19  4:26 PM   Specimen: STOOL  Result Value Ref Range Status   C Diff antigen NEGATIVE NEGATIVE Final   C Diff toxin NEGATIVE NEGATIVE Final   C Diff interpretation No C. difficile detected.  Final    Comment: Performed at [facility], [place] [address], [place], [place] [address]  GI pathogen panel by PCR, stool     Status: None    Collection Time: 06/05/19  4:26 PM   Specimen: STOOL  Result Value Ref Range Status   Plesiomonas shigelloides NOT DETECTED NOT DETECTED Final   Yersinia enterocolitica NOT DETECTED NOT DETECTED Final   Vibrio NOT DETECTED NOT DETECTED Final   Enteropathogenic E coli NOT DETECTED NOT DETECTED Final   E coli (ETEC) LT/ST NOT DETECTED NOT DETECTED Final   E coli [id number] by PCR Not applicable NOT DETECTED Final   Cryptosporidium by PCR NOT DETECTED NOT DETECTED Final   Entamoeba histolytica NOT DETECTED NOT DETECTED Final   Adenovirus F 40/41 NOT DETECTED NOT DETECTED Final   Norovirus GI/GII NOT DETECTED NOT DETECTED Final   Sapovirus NOT DETECTED NOT DETECTED Final    Comment: (NOTE) Performed At: [facility] [place], [place] [id number] Rush Farmer MD [id number]    Vibrio cholerae NOT DETECTED NOT DETECTED Final   Campylobacter by PCR NOT DETECTED NOT DETECTED Final   Salmonella by PCR NOT DETECTED NOT DETECTED Final   E coli (STEC) NOT DETECTED NOT DETECTED Final   Enteroaggregative E coli NOT DETECTED NOT DETECTED Final   Shigella by PCR NOT DETECTED NOT DETECTED Final   Cyclospora cayetanensis NOT DETECTED NOT DETECTED Final   Astrovirus NOT DETECTED NOT DETECTED Final   G lamblia by PCR NOT DETECTED NOT DETECTED Final   Rotavirus A by PCR NOT DETECTED NOT DETECTED Final     Time coordinating discharge: 38 minutes  SIGNED:   Georgette Shell, MD  [facility] 06/09/2019, 11:00 AM Pager   If 7PM-7AM, please contact night-coverage [email or web address] Password [email or web address]

## 2019-06-09 NOTE — Telephone Encounter (Signed)
Please advise 

## 2019-06-09 NOTE — Telephone Encounter (Signed)
Patients wife called in stating hospital changed dosage of insulin and they want to make sure that's ok     Please call and advise

## 2019-06-10 ENCOUNTER — Telehealth: Payer: Self-pay | Admitting: *Deleted

## 2019-06-10 LAB — CULTURE, BLOOD (ROUTINE X 2)
Culture: NO GROWTH
Culture: NO GROWTH
Special Requests: ADEQUATE
Special Requests: ADEQUATE

## 2019-06-10 NOTE — Telephone Encounter (Signed)
Please advise 

## 2019-06-10 NOTE — Telephone Encounter (Signed)
Transition Care Management Follow-up Telephone Call   Date discharged? 06/09/19   How have you been since you were released from the hospital? Spoke w/wife she states husband is doing ok. He has been laying around since coming home   Do you understand why you were in the hospital? YES   Do you understand the discharge instructions? YES   Where were you discharged to? Home   Items Reviewed:  Medications reviewed: YES  Allergies reviewed: YES  Dietary changes reviewed: YES  Referrals reviewed: No referral recommended   Functional Questionnaire:   Activities of Daily Living (ADLs):   He states he are independent in the following: ambulation, bathing and hygiene, feeding, continence, grooming, toileting and dressing States he doesn't require assistance    Any transportation issues/concerns?: NO   Any patient concerns? NO   Confirmed importance and date/time of follow-up visits scheduled YES, appt 06/13/19  Provider Appointment booked with Dr. Jenny Reichmann  Confirmed with patient if condition begins to worsen call PCP or go to the ER.  Patient was given the office number and encouraged to call back with question or concerns.  : YES

## 2019-06-10 NOTE — Telephone Encounter (Signed)
Patients wife called back in stating she can not afford all the medication the hospital sent to pharmacy for husband and she is afraid that if he doesn't have the medicine it could not be good. she's afraid for him until Monday.   Please call and advise

## 2019-06-10 NOTE — Telephone Encounter (Signed)
You are on Lantus, as you were prior to admission.  Which medication is so expensive?

## 2019-06-10 NOTE — Telephone Encounter (Signed)
Called wife to clarify. States they cannot afford Novolog. However, she was able to use a coupon provided by the pharmacist which reduced the cost from 400 dollars to 150 dollars. States she will discuss this in more detail on Monday. For now, wife states no further action is required.

## 2019-06-13 ENCOUNTER — Ambulatory Visit (INDEPENDENT_AMBULATORY_CARE_PROVIDER_SITE_OTHER): Payer: Medicare Other | Admitting: Internal Medicine

## 2019-06-13 ENCOUNTER — Telehealth: Payer: Self-pay | Admitting: Cardiovascular Disease

## 2019-06-13 ENCOUNTER — Other Ambulatory Visit: Payer: Self-pay

## 2019-06-13 ENCOUNTER — Ambulatory Visit (INDEPENDENT_AMBULATORY_CARE_PROVIDER_SITE_OTHER): Payer: Medicare Other | Admitting: Endocrinology

## 2019-06-13 ENCOUNTER — Encounter: Payer: Self-pay | Admitting: Endocrinology

## 2019-06-13 ENCOUNTER — Other Ambulatory Visit: Payer: Self-pay | Admitting: *Deleted

## 2019-06-13 ENCOUNTER — Encounter: Payer: Self-pay | Admitting: Internal Medicine

## 2019-06-13 VITALS — BP 144/74 | HR 70 | Ht 68.0 in | Wt 252.2 lb

## 2019-06-13 DIAGNOSIS — Z8619 Personal history of other infectious and parasitic diseases: Secondary | ICD-10-CM | POA: Diagnosis not present

## 2019-06-13 DIAGNOSIS — I10 Essential (primary) hypertension: Secondary | ICD-10-CM

## 2019-06-13 DIAGNOSIS — E1151 Type 2 diabetes mellitus with diabetic peripheral angiopathy without gangrene: Secondary | ICD-10-CM

## 2019-06-13 DIAGNOSIS — A419 Sepsis, unspecified organism: Secondary | ICD-10-CM

## 2019-06-13 DIAGNOSIS — Z794 Long term (current) use of insulin: Secondary | ICD-10-CM

## 2019-06-13 MED ORDER — INSULIN GLARGINE 100 UNIT/ML ~~LOC~~ SOLN: 150.0000 [IU] | SUBCUTANEOUS | 11 refills | Status: DC

## 2019-06-13 MED ORDER — AMLODIPINE BESYLATE 2.5 MG PO TABS: 7.5000 mg | ORAL_TABLET | Freq: Every day | ORAL | 3 refills | Status: DC

## 2019-06-13 MED ORDER — LOSARTAN POTASSIUM 100 MG PO TABS: 100.0000 mg | ORAL_TABLET | Freq: Every day | ORAL | 3 refills | Status: DC

## 2019-06-13 NOTE — Telephone Encounter (Signed)
TCM....  Patient is being discharged   They saw Daleen Snook pa at [facility]   They are scheduled to see Christell Faith 11/5

## 2019-06-13 NOTE — Progress Notes (Signed)
Subjective:    Patient ID: [name], male    DOB: 02-01-1956, 63 y.o.   MRN: [id number]  HPI  Here to f/u after hospn oct 24 - 54; :63 year old male with a history of diabetes mellitus, hypertension,PVD,Chronic draining pilonidal cyst, transitional cellbladder CA s/p resection in 2013, whofollows Urologyunderwent yearly surveillenacecystoscopy on Thursday 10/22 by Dr. Karsten Ro. The following day he started to feel sick, developed fever/ chills, loss of appetite which progressively worsened to include shortness of breath.  Found to have sepsis/pyelonephritis for which he was initially treated with Vanco and cefepime.  He was hypotensive on admission treated with IV fluids.  Urine culture grew more than 50,000 colonies of E. coli.  Blood culture remained negative.  Also starte metoprolol 25 bid for persistent elevated BP. Electrolytes replaced. Unforunately had rash due to something, and may be allergic to cefiepime, metoprol or myrbetrux, so all stopped.    Losartan had been decreased to to 50 mg and BP improved. Also after seeing Dr Loanne Drilling and med adjusted to 150 lantus after pickd up expensive med at d/c.  Today pt states Pt denies chest pain, increased sob or doe, wheezing, orthopnea, PND, [name], palpitations, dizziness or syncope.  Denies urinary symptoms such as dysuria, frequency, urgency, flank pain, hematuria or n/v, fever, chills.   Pt denies fever, wt loss, night sweats, loss of appetite, or other constitutional symptoms  No new compaints Past Medical History:  Diagnosis Date  . Allergic rhinitis   . At risk for sleep apnea    STOP-BANG= 5   SENT TO PCP 03-14-2014  . Cataract    surgically removed bilateral  . Condyloma acuminatum of penis   . Diabetic neuropathy ([place])   . GERD (gastroesophageal reflux disease)   . History of bladder cancer    s/p  turbt  2013/   transitional cell carcinoma--   . History of condyloma acuminatum    PERINEAL AREA  W/ RECURRENCY   . History of gout   . Hyperlipidemia   . Hypertension   . Lower urinary tract symptoms (LUTS)   . Productive cough   . PVD (peripheral vascular disease) with claudication (HCC)    bilateral SFA disease-- right > left  and left tibial artery disease--  per duplex  . Smokers' cough ([place])   . Type 2 diabetes mellitus with insulin therapy ([place])    monitor by  dr ellsion  . Wears dentures    upper   Past Surgical History:  Procedure Laterality Date  . AXILLARY HIDRADENITIS EXCISION  1997  . CARDIOVASCULAR STRESS TEST  07-24-2014  dr Kathlyn [place]   Low risk lexiscan nuclear study with apical thinning and small inferolateral wall infarct at mid & basal level , no ischemia/  normal LVF and wall motion , ef 59%  . CATARACT EXTRACTION Left   . CATARACT EXTRACTION W/ INTRAOCULAR LENS IMPLANT Right   . CO2 LASER APPLICATION N/A 0000000   Procedure: CO2 LASER APPLICATION,PENIS, GROIN, ANUS;  Surgeon: [name], MD;  Location: [facility];  Service: General;  Laterality: N/A;  . CO2 LASER APPLICATION N/A [id number]   Procedure: CO2 LASER APPLICATION;  Surgeon: Kathie Rhodes, MD;  [facility];  Service: Urology;  Laterality: N/A;  . CONDYLOMA EXCISION/FULGURATION N/A 05/21/2015   Procedure: CONDYLOMA REMOVAL;  Surgeon: Kathie Rhodes, MD;  Location: [facility];  Service: Urology;  Laterality: N/A;  . HEMORRHOID SURGERY  10/24/2014   Procedure:  HEMORRHOIDECTOMY;  Surgeon: Michael Boston, MD;  Location: [facility];  Service: General;;  . INCISION AND DRAINAGE ABSCESS Left 10/24/2014   Procedure: INCISION AND DRAINAGE ABSCESS;  Surgeon: Michael Boston, MD;  Location: [facility];  Service: General;  Laterality: Left;  . INGUINAL HIDRADENITIS EXCISION  1998, 1999  . LASER ABLATION CONDOLAMATA N/A 03/20/2014   Procedure: EXAM UNDER ANESTHESIA, REMOVAL/ABLATION OF CONDYLOMATA PENIS,GROINS, ANUS, ANAL CANAL;  Surgeon:  [name], MD;  Location: [facility];  Service: General;  Laterality: N/A;  groin and anus  . LASER ABLATION CONDOLAMATA N/A 10/24/2014   Procedure: LASER ABLATION CONDOLAMATA;  Surgeon: Michael Boston, MD;  [facility];  Service: General;  Laterality: N/A;  . LASER ABLATION OF PENILE AND PERIANAL WARTS  07-29-2007  Dr. Johney Maine  . LEFT SHOULDER SURGERY  2003  . MASS EXCISION N/A 10/24/2014   Procedure: EXCISION OF PERINEAL MASS/SINUS;  Surgeon: Michael Boston, MD;  Location: [facility];  Service: General;  Laterality: N/A;  . MOHS SURGERY     back  . MOHS SURGERY  2017   face  . MULTIPLE TOOTH EXTRACTIONS    . PERINEAL HIDRADENITIS EXCISION  1998, 1999  . TRANSURETHRAL RESECTION OF BLADDER TUMOR  05/21/2012   Procedure: TRANSURETHRAL RESECTION OF BLADDER TUMOR (TURBT);  Surgeon: Claybon Jabs, MD;  Location: [facility];  Service: Urology;  Laterality: N/A;       reports that he quit smoking about 3 years ago. His smoking use included cigars and cigarettes. He has a 41.00 pack-year smoking history. He has never used smokeless tobacco. He reports that he does not drink alcohol or use drugs. family history includes Cancer in his father; Diabetes in his maternal aunt; Hypertension in his mother. Allergies  Allergen Reactions  . Cefepime Hives    Had allergic reaction to one of these 3 agents, unclear which one  . Metoprolol Hives    Had allergic reaction to one of these 3 agents, unclear which one  *Per RN, highly likely this is the cause of allergic rxn  . Myrbetriq [Mirabegron] Hives    Had allergic reaction to one of these 3 agents, unclear which one   Current Outpatient Medications on File Prior to Visit  Medication Sig Dispense Refill  . acetaminophen (TYLENOL) 500 MG tablet Take 500 mg by mouth every 6 (six) hours as needed.    Marland Kitchen aspirin EC 81 MG tablet Take 1 tablet (81 mg total) by mouth daily.    Marland Kitchen  atorvastatin (LIPITOR) 20 MG tablet Take 1 tablet (20 mg total) by mouth daily. 90 tablet 3  . hydrochlorothiazide (HYDRODIURIL) 25 MG tablet Take 1 tablet (25 mg total) by mouth daily. 90 tablet 3  . insulin glargine (LANTUS) 100 UNIT/ML injection Inject 1.5 mLs (150 Units total) into the skin every morning. 10 mL 11  . loratadine (CLARITIN) 10 MG tablet Take 10 mg by mouth daily.    . pantoprazole (PROTONIX) 40 MG tablet Take 1 tablet (40 mg total) by mouth daily. 90 tablet 3  . potassium chloride 20 MEQ TBCR Take 20 mEq by mouth daily. 30 tablet 6  . Vitamin D, Ergocalciferol, (DRISDOL) 1.25 MG (50000 UT) CAPS capsule Take 1 capsule (50,000 Units total) by mouth every 7 (seven) days. (Patient taking differently: Take 50,000 Units by mouth every Friday. ) 12 capsule 0   No current facility-administered medications on file prior to visit.  Review of Systems  Constitutional: Negative for other unusual diaphoresis or sweats HENT: Negative for ear discharge or swelling Eyes: Negative for other worsening visual disturbances Respiratory: Negative for stridor or other swelling  Gastrointestinal: Negative for worsening distension or other blood Genitourinary: Negative for retention or other urinary change Musculoskeletal: Negative for other MSK pain or swelling Skin: Negative for color change or other new lesions Neurological: Negative for worsening tremors and other numbness  Psychiatric/Behavioral: Negative for worsening agitation or other fatigue All otherwise neg per pt     Objective:   Physical Exam BP (!) 160/68 (BP Location: Left Arm, Patient Position: Sitting, Cuff Size: Large)   Pulse 73   Temp 97.7 F (36.5 C) (Oral)   Ht 5\' 8"  (1.727 m)   Wt 252 lb (114.3 kg)   SpO2 95%   BMI 38.32 kg/m  VS noted,  Constitutional: Pt appears in NAD HENT: Head: NCAT.  Right Ear: External ear normal.  Left Ear: External ear normal.  Eyes: . Pupils are equal, round, and reactive to light.  Conjunctivae and EOM are normal Nose: without d/c or deformity Neck: Neck supple. Gross normal ROM Cardiovascular: Normal rate and regular rhythm.   Pulmonary/Chest: Effort normal and breath sounds without rales or wheezing.  Abd:  Soft, NT, ND, + BS, no organomegaly Neurological: Pt is alert. At baseline orientation, motor grossly intact Skin: Skin is warm. No rashes, other new lesions, no LE edema Psychiatric: Pt behavior is normal without agitation  All otherwise neg per pt Lab Results  Component Value Date   WBC 12.5 (H) 06/09/2019   HGB 13.4 06/09/2019   HCT 39.1 06/09/2019   PLT 259 06/09/2019   GLUCOSE 162 (H) 06/16/2019   CHOL 151 06/16/2019   TRIG 205 (H) 06/16/2019   HDL 28 (L) 06/16/2019   LDLDIRECT 67.0 06/15/2018   LDLCALC 88 06/16/2019   ALT 59 (H) 06/16/2019   AST 50 (H) 06/16/2019   NA 138 06/16/2019   K 4.7 06/16/2019   CL 98 06/16/2019   CREATININE 1.45 (H) 06/16/2019   BUN 15 06/16/2019   CO2 22 06/16/2019   TSH 1.56 04/21/2019   PSA 3.27 04/21/2019   INR 1.1 06/04/2019   HGBA1C 8.9 (H) 06/08/2019   MICROALBUR 85.2 (H) 04/21/2019         Assessment & Plan:

## 2019-06-13 NOTE — Telephone Encounter (Signed)
-----   Message from Abigail Butts, PA-C sent at 06/13/2019  7:40 AM EST ----- Regarding: RE: Follow-up with Dr. Fletcher Anon That will be fine - and yes, please make it a TOC visit. Thank you!! ----- Message ----- From: Clarisse Gouge Sent: 06/10/2019   3:06 PM EST To: Abigail Butts, PA-C Subject: RE: Follow-up with Dr. Excell [place],   The wife called earlier and asked for 1 wk fu .  Is 11/5 too soon ?  Is this a tcm ?  Thanks  [employer]  ----- Message ----- From: Abigail Butts., PA-C Sent: 06/09/2019  11:38 AM EDT To: Rebeca Alert Burl Scheduling Subject: Follow-up with Dr. Fletcher Anon                       Please schedule this patient for a follow-up appointment and call them with that information.  Primary Cardiologist: Dr. Fletcher Anon Date of Discharge: 06/09/2019 Appointment Needed Within: 3 months Appointment Type: follow-up appointment with Dr. Fletcher Anon  Thank you!  Roby Lofts, PA-C

## 2019-06-13 NOTE — Patient Outreach (Signed)
2 Red EMMI's received (general discharge) for knows who to call for change in condition- no, other questions- yes.  RN CM spoke with pt, HIPAA verified, addressed EMMI flags and pt states he saw his oncologist today " and he cleared everything up, I have no further questions"   Pt states he had a medication change and had questions but this has been resolved, reports knowing who to call for change in condition.  No new concerns reported.  Jacqlyn Larsen [facility] Coordinator [phone]

## 2019-06-13 NOTE — Patient Instructions (Addendum)
Ok to increase the losartan back to 100 mg (which you have already done), stay off the metoprolol due to the possible rash allergy  Ok to increase the amlodipine to 3 pills in the am (for total 7.5 mg per day) - a new prescription is sent  Please continue to monitor your blood pressure on a regular basis, with the goal being to be less than 140/90  Please continue all other medications as before, and refills have been done if requested.  Please have the pharmacy call with any other refills you may need.  Please continue your efforts at being more active, low cholesterol diet, and weight control.  Please keep your appointments with your specialists as you may have planned

## 2019-06-13 NOTE — Progress Notes (Signed)
Subjective:    Patient ID: [name], male    DOB: 11-May-1956, 63 y.o.   MRN: [id number]  HPI Pt returns for f/u of diabetes mellitus: DM type: Insulin-requiring type 2.   Dx'ed: 0000000 Complications: PAD, renal insuff, and polyneuropathy.   Therapy: insulin since 2009 DKA: never Severe hypoglycemia: never.  Pancreatitis: never.  Other: he takes a QD insulin regimen, after poor results with more frequent injections.   Interval history: He brings his meter with his cbg's which I have reviewed today.  cbg varies from 134-400.  There is no trend throughout the day.  He was recently in the hospital, when novolog was added.  Pt says he does not want to take.  Pt says he never misses the insulin.  Wife says pt takes insulin as rx'ed, but he wants to reduce # of injections.  However, he wants to use up the Lantus he has.  Wife is on doxy, but pt is in person.   Past Medical History:  Diagnosis Date  . Allergic rhinitis   . At risk for sleep apnea    STOP-BANG= 5   SENT TO PCP 03-14-2014  . Cataract    surgically removed bilateral  . Condyloma acuminatum of penis   . Diabetic neuropathy ([place])   . GERD (gastroesophageal reflux disease)   . History of bladder cancer    s/p  turbt  2013/   transitional cell carcinoma--   . History of condyloma acuminatum    PERINEAL AREA  W/ RECURRENCY  . History of gout   . Hyperlipidemia   . Hypertension   . Lower urinary tract symptoms (LUTS)   . Productive cough   . PVD (peripheral vascular disease) with claudication ([facility])    bilateral SFA disease-- right > left  and left tibial artery disease--  per duplex  . Smokers' cough ([place])   . Type 2 diabetes mellitus with insulin therapy ([place])    monitor by  dr ellsion  . Wears dentures    upper    Past Surgical History:  Procedure Laterality Date  . AXILLARY HIDRADENITIS EXCISION  1997  . CARDIOVASCULAR STRESS TEST  07-24-2014  dr Kathlyn [place]   Low risk lexiscan nuclear study with apical  thinning and small inferolateral wall infarct at mid & basal level , no ischemia/  normal LVF and wall motion , ef 59%  . CATARACT EXTRACTION Left   . CATARACT EXTRACTION W/ INTRAOCULAR LENS IMPLANT Right   . CO2 LASER APPLICATION N/A 0000000   Procedure: CO2 LASER APPLICATION,PENIS, GROIN, ANUS;  Surgeon: [name], MD;  Location: [facility];  Service: General;  Laterality: N/A;  . CO2 LASER APPLICATION N/A [id number]   Procedure: CO2 LASER APPLICATION;  Surgeon: Kathie Rhodes, MD;  Location: [facility];  Service: Urology;  Laterality: N/A;  . CONDYLOMA EXCISION/FULGURATION N/A 05/21/2015   Procedure: CONDYLOMA REMOVAL;  Surgeon: Kathie Rhodes, MD;  Location: [facility];  Service: Urology;  Laterality: N/A;  . HEMORRHOID SURGERY  10/24/2014   Procedure: HEMORRHOIDECTOMY;  Surgeon: Michael Boston, MD;  Location: [facility];  Service: General;;  . INCISION AND DRAINAGE ABSCESS Left 10/24/2014   Procedure: INCISION AND DRAINAGE ABSCESS;  Surgeon: Michael Boston, MD;  Location: [facility];  Service: General;  Laterality: Left;  . INGUINAL HIDRADENITIS EXCISION  1998, 1999  . LASER ABLATION CONDOLAMATA N/A 03/20/2014   Procedure: EXAM UNDER ANESTHESIA, REMOVAL/ABLATION OF CONDYLOMATA PENIS,GROINS, ANUS, ANAL  CANAL;  Surgeon: [name], MD;  Location: [facility];  Service: General;  Laterality: N/A;  groin and anus  . LASER ABLATION CONDOLAMATA N/A 10/24/2014   Procedure: LASER ABLATION CONDOLAMATA;  Surgeon: Michael Boston, MD;  Location: [facility];  Service: General;  Laterality: N/A;  . LASER ABLATION OF PENILE AND PERIANAL WARTS  07-29-2007  Dr. Johney Maine  . LEFT SHOULDER SURGERY  2003  . MASS EXCISION N/A 10/24/2014   Procedure: EXCISION OF PERINEAL MASS/SINUS;  Surgeon: Michael Boston, MD;  Location: [place];  Service: General;  Laterality: N/A;  . MOHS SURGERY      back  . MOHS SURGERY  2017   face  . MULTIPLE TOOTH EXTRACTIONS    . PERINEAL HIDRADENITIS EXCISION  1998, 1999  . TRANSURETHRAL RESECTION OF BLADDER TUMOR  05/21/2012   Procedure: TRANSURETHRAL RESECTION OF BLADDER TUMOR (TURBT);  Surgeon: Claybon Jabs, MD;  Location: [facility];  Service: Urology;  Laterality: N/A;       Social History   Socioeconomic History  . Marital status: Married    Spouse name: Not on file  . Number of children: Not on file  . Years of education: Not on file  . Highest education level: Not on file  Occupational History  . Occupation: Disabled  Social Needs  . Financial resource strain: Not on file  . Food insecurity    Worry: Not on file    Inability: Not on file  . Transportation needs    Medical: Not on file    Non-medical: Not on file  Tobacco Use  . Smoking status: Former Smoker    Packs/day: 1.00    Years: 41.00    Pack years: 41.00    Types: Cigars, Cigarettes    Quit date: 04/11/2016    Years since quitting: 3.1  . Smokeless tobacco: Never Used  Substance and Sexual Activity  . Alcohol use: No    Alcohol/week: 0.0 standard drinks  . Drug use: No  . Sexual activity: Not on file  Lifestyle  . Physical activity    Days per week: Not on file    Minutes per session: Not on file  . Stress: Not on file  Relationships  . Social Herbalist on phone: Not on file    Gets together: Not on file    Attends religious service: Not on file    Active member of club or organization: Not on file    Attends meetings of clubs or organizations: Not on file    Relationship status: Not on file  . Intimate partner violence    Fear of current or ex partner: Not on file    Emotionally abused: Not on file    Physically abused: Not on file    Forced sexual activity: Not on file  Other Topics Concern  . Not on file  Social History Narrative  . Not on file    Current Outpatient Medications on File Prior to Visit  Medication  Sig Dispense Refill  . acetaminophen (TYLENOL) 500 MG tablet Take 500 mg by mouth every 6 (six) hours as needed.    Marland Kitchen aspirin EC 81 MG tablet Take 1 tablet (81 mg total) by mouth daily.    Marland Kitchen atorvastatin (LIPITOR) 20 MG tablet Take 1 tablet (20 mg total) by mouth daily. 90 tablet 3  . hydrochlorothiazide (HYDRODIURIL) 25 MG tablet Take 1 tablet (25 mg total) by mouth daily.  90 tablet 3  . loratadine (CLARITIN) 10 MG tablet Take 10 mg by mouth daily.    . pantoprazole (PROTONIX) 40 MG tablet Take 1 tablet (40 mg total) by mouth daily. 90 tablet 3  . potassium chloride 20 MEQ TBCR Take 20 mEq by mouth daily. 30 tablet 6  . Vitamin D, Ergocalciferol, (DRISDOL) 1.25 MG (50000 UT) CAPS capsule Take 1 capsule (50,000 Units total) by mouth every 7 (seven) days. (Patient taking differently: Take 50,000 Units by mouth every Friday. ) 12 capsule 0   No current facility-administered medications on file prior to visit.     Allergies  Allergen Reactions  . Cefepime Hives    Had allergic reaction to one of these 3 agents, unclear which one  . Metoprolol Hives    Had allergic reaction to one of these 3 agents, unclear which one  *Per RN, highly likely this is the cause of allergic rxn  . Myrbetriq [Mirabegron] Hives    Had allergic reaction to one of these 3 agents, unclear which one    Family History  Problem Relation Age of Onset  . Hypertension Mother   . Cancer Father        lung ca  . Diabetes Maternal Aunt        x 2  . Colon cancer Neg Hx   . Esophageal cancer Neg Hx   . Pancreatic cancer Neg Hx   . Prostate cancer Neg Hx   . Kidney disease Neg Hx   . Liver disease Neg Hx   . Lung cancer Neg Hx   . Rectal cancer Neg Hx   . Stomach cancer Neg Hx     BP (!) 144/74 (BP Location: Left Arm, Patient Position: Sitting, Cuff Size: Large)   Pulse 70   Ht 5\' 8"  (1.727 m)   Wt 252 lb 3.2 oz (114.4 kg)   SpO2 97%   BMI 38.35 kg/m    Review of Systems He denies hypoglycemia.       Objective:   Physical Exam VITAL SIGNS:  See vs page GENERAL: no distress Pulses: dorsalis pedis intact bilat.  MSK: no deformity of the feet.   CV: no leg edema, but there are bilat vv's.   Skin:  no ulcer on the feet.  normal color and temp on the feet.   Neuro: sensation is intact to touch on the feet.   Ext: there is bilateral onychomycosis of the toenails.    Lab Results  Component Value Date   HGBA1C 8.9 (H) 06/08/2019   Lab Results  Component Value Date   CREATININE 1.14 06/09/2019   BUN 20 06/09/2019   NA 138 06/09/2019   K 3.7 06/09/2019   CL 103 06/09/2019   CO2 26 06/09/2019       Assessment & Plan:  HTN: is noted today Insulin-requiring type 2 DM, with PAD: worse.   Patient Instructions  Your blood pressure is high today.  Please see your primary care provider soon, to have it rechecked.   Please stop taking the Novolog, and:  Take Lantus, 150 units each morning.   With time, your need for insulin will increase back up to what it was before you got sick.   check your blood sugar twice a day.  vary the time of day when you check, between before the 3 meals, and at bedtime.  also check if you have symptoms of your blood sugar being too high or too low.  please keep a  record of the readings and bring it to your next appointment here (or you can bring the meter itself).  You can write it on any piece of paper.  please call us sooner if your blood sugar goes below 70, or if you have a lot of readings over 200.   Please come back for a follow-up appointment in 2-4 weeks.

## 2019-06-13 NOTE — Patient Instructions (Addendum)
Your blood pressure is high today.  Please see your primary care provider soon, to have it rechecked.   Please stop taking the Novolog, and:  Take Lantus, 150 units each morning.   With time, your need for insulin will increase back up to what it was before you got sick.   check your blood sugar twice a day.  vary the time of day when you check, between before the 3 meals, and at bedtime.  also check if you have symptoms of your blood sugar being too high or too low.  please keep a record of the readings and bring it to your next appointment here (or you can bring the meter itself).  You can write it on any piece of paper.  please call us sooner if your blood sugar goes below 70, or if you have a lot of readings over 200.   Please come back for a follow-up appointment in 2-4 weeks.

## 2019-06-13 NOTE — Progress Notes (Signed)
Cardiology Office Note    Date:  06/16/2019   ID:  Paul, [name] 11-07-1955, MRN [id number]  PCP:  Biagio Borg, MD  Cardiologist:  Kathlyn Sacramento, MD  Electrophysiologist:  None   Chief Complaint: Hospital follow-up  History of Present Illness:   [name] is a 63 y.o. male with history of CAD, PAD, insulin-dependent diabetes diagnosed in 1992 with diabetic neuropathy, HTN, HLD, prior tobacco abuse quitting in 2017, bladder cancer status post resection, chronic draining pilonidal cyst, and GERD who presents for hospital follow-up as detailed below.  Patient had atypical chest pain in 2015 with nuclear stress test showing a small inferolateral infarct with no significant ischemia.  Echo from 03/2018 showed an EF of 60 to 65%, normal wall motion, grade 2 diastolic dysfunction, normal RV systolic function.  With regards to his PAD, he underwent noninvasive imaging in 2015 which showed a long occlusion of the right SFA from mid to distal segment with reconstitution in the popliteal artery, with significant left SFA stenosis.  Normal ABI on the left and 0.44 on the right.  He was most recently seen virtually in 11/2018 and denied any claudication symptoms at that time.  He was admitted to [facility] in late 05/2019 with sepsis secondary to UTI/pyelonephritis as well as hypertensive urgency.  High-sensitivity troponin of 162 peaking at 741 with EKG showing nonspecific changes.  CTA chest negative for PE with incidentally noted coronary artery calcifications and aortic atherosclerosis.  BP was significantly elevated upon presentation.  BNP 112 with chest x-ray showing trace left pleural effusion.  Echo during the admission showed an EF of 60 to 65%, moderately increased LVH, diastolic dysfunction, normal RV systolic function and cavity size, trace mitral, mild aortic valve sclerosis without stenosis.  During his admission patient was noted to have intermittent runs of narrow complex  tachycardia, possibly SVT.  He was significantly hypokalemic and hypomagnesemic upon presentation which were subsequently repleted.  He was started on multiple new medications and developed a rash thereafter, subsequently leading to the discontinuation of these medications including metoprolol, cefepime, and Myrbetriq.  Patient's dyspnea was felt to be related to his hypertensive urgency which resolved with improvement in blood pressure.  Patient was subsequently discharged on metoprolol as it was felt this was not playing a role in his rash.  Patient comes in accompanied by his wife today.  His main focus appears to be on the cost of his insulin.  He denies any chest pain, palpitations, shortness of breath, dizziness, presyncope, syncope.  No lower extremity swelling, abdominal tension, orthopnea, PND, early satiety.  He reports his KCl pills are being passed in his stool whole.  With this, he did note some cramping in his bilateral lower extremities this morning that resolved after eating a banana.  He has subsequently been evaluated by his PCP with recommendation to discontinue metoprolol followed by escalation of losartan and amlodipine given poorly controlled blood pressure.  He requested referral to a dietitian.   Labs: 05/2009 - WBC 12.5, Hgb 13.4, PLT 259, potassium 3.7, BUN 20, serum creatinine 1.14, A1c 8.9, magnesium 2.4, albumin 3.1, AST 192, ALT normal 04/2019 - TSH normal 06/2018 - direct LDL 67, TC 120, TG 256, HDL 30  Past Medical History:  Diagnosis Date   Allergic rhinitis    At risk for sleep apnea    STOP-BANG= 5   SENT TO PCP 03-14-2014   Cataract    surgically removed bilateral   Condyloma acuminatum  of penis    Diabetic neuropathy (HCC)    GERD (gastroesophageal reflux disease)    History of bladder cancer    s/p  turbt  2013/   transitional cell carcinoma--    History of condyloma acuminatum    PERINEAL AREA  W/ RECURRENCY   History of gout    Hyperlipidemia     Hypertension    Lower urinary tract symptoms (LUTS)    Productive cough    PVD (peripheral vascular disease) with claudication (HCC)    bilateral SFA disease-- right > left  and left tibial artery disease--  per duplex   Smokers' cough ([place])    Type 2 diabetes mellitus with insulin therapy ([place])    monitor by  dr ellsion   Wears dentures    upper    Past Surgical History:  Procedure Laterality Date   AXILLARY HIDRADENITIS EXCISION  1997   CARDIOVASCULAR STRESS TEST  07-24-2014  dr Kathlyn Sacramento   Low risk lexiscan nuclear study with apical thinning and small inferolateral wall infarct at mid & basal level , no ischemia/  normal LVF and wall motion , ef 59%   CATARACT EXTRACTION Left    CATARACT EXTRACTION W/ INTRAOCULAR LENS IMPLANT Right    CO2 LASER APPLICATION N/A 0000000   Procedure: CO2 LASER APPLICATION,PENIS, GROIN, ANUS;  Surgeon: [name], MD;  Location: [facility];  Service: General;  Laterality: N/A;   CO2 LASER APPLICATION N/A [id number]   Procedure: CO2 LASER APPLICATION;  Surgeon: Kathie Rhodes, MD;  Location: [place];  Service: Urology;  Laterality: N/A;   CONDYLOMA EXCISION/FULGURATION N/A 05/21/2015   Procedure: CONDYLOMA REMOVAL;  Surgeon: Kathie Rhodes, MD;  Location: [facility];  Service: Urology;  Laterality: N/A;   HEMORRHOID SURGERY  10/24/2014   Procedure: HEMORRHOIDECTOMY;  Surgeon: Michael Boston, MD;  Location: [facility];  Service: General;;   INCISION AND DRAINAGE ABSCESS Left 10/24/2014   Procedure: INCISION AND DRAINAGE ABSCESS;  Surgeon: Michael Boston, MD;  Location: [place];  Service: General;  Laterality: Left;   INGUINAL HIDRADENITIS [place] N/A 03/20/2014   Procedure: EXAM UNDER ANESTHESIA, REMOVAL/ABLATION OF CONDYLOMATA PENIS,GROINS, ANUS, ANAL CANAL;  Surgeon: [name], MD;  Location: [place];  Service: General;  Laterality: N/A;  groin and anus   LASER ABLATION CONDOLAMATA N/A 10/24/2014   Procedure: LASER ABLATION CONDOLAMATA;  Surgeon: Michael Boston, MD;  Location: [place];  Service: General;  Laterality: N/A;   LASER ABLATION OF PENILE AND PERIANAL WARTS  07-29-2007  Dr. Johney Maine   LEFT SHOULDER SURGERY  2003   MASS EXCISION N/A 10/24/2014   Procedure: EXCISION OF PERINEAL MASS/SINUS;  Surgeon: Michael Boston, MD;  Location: [place];  Service: General;  Laterality: N/A;   MOHS SURGERY     back   MOHS SURGERY  2017   face   MULTIPLE TOOTH EXTRACTIONS     PERINEAL HIDRADENITIS [place] TUMOR  05/21/2012   Procedure: TRANSURETHRAL RESECTION OF BLADDER TUMOR (TURBT);  Surgeon: Claybon Jabs, MD;  Location: [facility];  Service: Urology;  Laterality: N/A;       Current Medications: Current Meds  Medication Sig   acetaminophen (TYLENOL) 500 MG tablet Take 500 mg by mouth every 6 (six) hours as needed.   amLODipine (NORVASC) 2.5 MG tablet Take  3 tablets (7.5 mg total) by mouth daily.   aspirin EC 81 MG tablet Take 1 tablet (81 mg total) by mouth daily.   atorvastatin (LIPITOR) 20 MG tablet Take 1 tablet (20 mg total) by mouth daily.   hydrochlorothiazide (HYDRODIURIL) 25 MG tablet Take 1 tablet (25 mg total) by mouth daily.   insulin glargine (LANTUS) 100 UNIT/ML injection Inject 1.5 mLs (150 Units total) into the skin every morning.   loratadine (CLARITIN) 10 MG tablet Take 10 mg by mouth daily.   losartan (COZAAR) 100 MG tablet Take 1 tablet (100 mg total) by mouth daily.   pantoprazole (PROTONIX) 40 MG tablet Take 1 tablet (40 mg total) by mouth daily.   potassium chloride 20 MEQ TBCR Take 20 mEq by mouth daily.   Vitamin D, Ergocalciferol, (DRISDOL) 1.25 MG (50000 UT) CAPS capsule Take 1 capsule (50,000 Units total) by mouth every 7  (seven) days. (Patient taking differently: Take 50,000 Units by mouth every Friday. )    Allergies:   Cefepime, Metoprolol, and Myrbetriq [mirabegron]   Social History   Socioeconomic History   Marital status: Married    Spouse name: Not on file   Number of children: Not on file   Years of education: Not on file   Highest education level: Not on file  Occupational History   Occupation: Disabled  Scientist, product/process development strain: Not on file   Food insecurity    Worry: Not on file    Inability: Not on file   Transportation needs    Medical: Not on file    Non-medical: Not on file  Tobacco Use   Smoking status: Former Smoker    Packs/day: 1.00    Years: 41.00    Pack years: 41.00    Types: Cigars, Cigarettes    Quit date: 04/11/2016    Years since quitting: 3.1   Smokeless tobacco: Never Used  Substance and Sexual Activity   Alcohol use: No    Alcohol/week: 0.0 standard drinks   Drug use: No   Sexual activity: Not on file  Lifestyle   Physical activity    Days per week: Not on file    Minutes per session: Not on file   Stress: Not on file  Relationships   Social connections    Talks on phone: Not on file    Gets together: Not on file    Attends religious service: Not on file    Active member of club or organization: Not on file    Attends meetings of clubs or organizations: Not on file    Relationship status: Not on file  Other Topics Concern   Not on file  Social History Narrative   Not on file     Family History:  The patient's family history includes Cancer in his father; Diabetes in his maternal aunt; Hypertension in his mother. There is no history of Colon cancer, Esophageal cancer, Pancreatic cancer, Prostate cancer, Kidney disease, Liver disease, Lung cancer, Rectal cancer, or Stomach cancer.  ROS:   Review of Systems  Constitutional: Positive for malaise/fatigue. Negative for chills, diaphoresis, fever and weight loss.  HENT:  Negative for congestion.   Eyes: Negative for discharge and redness.  Respiratory: Negative for cough, hemoptysis, sputum production, shortness of breath and wheezing.   Cardiovascular: Negative for chest pain, palpitations, orthopnea, claudication, leg swelling and PND.  Gastrointestinal: Negative for abdominal pain, blood in stool, heartburn, melena, nausea and vomiting.  Genitourinary: Negative for hematuria.  Musculoskeletal: Negative for falls and myalgias.       Lower extremity cramping  Skin: Negative for rash.  Neurological: Positive for weakness. Negative for dizziness, tingling, tremors, sensory change, speech change, focal weakness and loss of consciousness.  Endo/Heme/Allergies: Does not bruise/bleed easily.  Psychiatric/Behavioral: Negative for substance abuse. The patient is not nervous/anxious.   All other systems reviewed and are negative.    EKGs/Labs/Other Studies Reviewed:    Studies reviewed were summarized above. The additional studies were reviewed today: As above.   EKG:  EKG is ordered today.  The EKG ordered today demonstrates NSR, 76 bpm, no acute ST-T changes  Recent Labs: 04/21/2019: TSH 1.56 06/04/2019: B Natriuretic Peptide 112.6 06/05/2019: ALT 36; Magnesium 2.4 06/09/2019: BUN 20; Creatinine, Ser 1.14; Hemoglobin 13.4; Platelets 259; Potassium 3.7; Sodium 138  Recent Lipid Panel    Component Value Date/Time   CHOL 100 04/21/2019 1041   CHOL 123 03/29/2018 0746   TRIG 121.0 04/21/2019 1041   HDL 28.10 (L) 04/21/2019 1041   HDL 28 (L) 03/29/2018 0746   CHOLHDL 4 04/21/2019 1041   VLDL 24.2 04/21/2019 1041   LDLCALC 48 04/21/2019 1041   LDLCALC 34 03/29/2018 0746   LDLDIRECT 67.0 06/15/2018 1652    PHYSICAL EXAM:    VS:  BP (!) 160/70 (BP Location: Left Arm, Patient Position: Sitting, Cuff Size: Normal)    Pulse 76    Ht 5\' 8"  (1.727 m)    Wt 246 lb (111.6 kg)    BMI 37.40 kg/m   BMI: Body mass index is 37.4 kg/m.  Physical Exam    Constitutional: He is oriented to person, place, and time. He appears well-developed and well-nourished.  HENT:  Head: Normocephalic and atraumatic.  Eyes: Right eye exhibits no discharge. Left eye exhibits no discharge.  Neck: Normal range of motion. No JVD present.  Cardiovascular: Normal rate, regular rhythm, S1 normal, S2 normal and normal heart sounds. Exam reveals no distant heart sounds, no friction rub, no midsystolic click and no opening snap.  No murmur heard. Pulses:      Posterior tibial pulses are 2+ on the right side and 2+ on the left side.  Pulmonary/Chest: Effort normal and breath sounds normal. No respiratory distress. He has no decreased breath sounds. He has no wheezes. He has no rales. He exhibits no tenderness.  Abdominal: Soft. He exhibits no distension. There is no abdominal tenderness.  Musculoskeletal:        General: No edema.  Neurological: He is alert and oriented to person, place, and time.  Skin: Skin is warm and dry. No cyanosis. Nails show no clubbing.  Psychiatric: He has a normal mood and affect. His speech is normal and behavior is normal. Judgment and thought content normal.  Vitals reviewed.   Wt Readings from Last 3 Encounters:  06/16/19 246 lb (111.6 kg)  06/13/19 252 lb (114.3 kg)  06/13/19 252 lb 3.2 oz (114.4 kg)     ASSESSMENT & PLAN:   1. CAD/elevated troponin: He denies any symptoms concerning for chest pain.  Patient's elevated troponin while admitted was felt to be supply demand ischemia in the setting of hypertensive urgency and acute illness, along with paroxysms of SVT.  Continue aspirin, amlodipine, and Lipitor.  Schedule coronary CTA.  2. HFpEF: He appears euvolemic and well compensated.  Not requiring loop diuretic.  Remains on HCTZ as outlined below.  CHF education.  Check renal function and potassium.  3. Paroxysmal SVT: Noted during acute illness while  admitted to the hospital.  He denies any further palpitations.  No longer on  metoprolol given question of rash, though this was felt to be less likely etiology of his rash.  Place ZIO monitor following coronary CTA to evaluate for ectopy.  If he is found to have significant arrhythmia we may need to revisit metoprolol versus transitioning his amlodipine to a nondihydropyridine calcium channel blocker.  Recent TSH and magnesium at goal.  Check potassium given patient reporting his potassium pills are being passed through his body whole.   4. HTN: Blood pressure is suboptimally controlled.  Increase amlodipine to 10 mg daily.  Continue HCTZ 25 mg daily and losartan 100 mg daily.  We may need to revisit addition of metoprolol as outlined above.  Follow-up with PCP as directed.  5. HLD: LDL of 67 from 06/2018.  Patient is fasting this morning.  Check lipid panel.  Remains on atorvastatin.  6. Insulin-dependent diabetes: Referral to dietitian made at patient request.  If further documentation is needed for this he can discuss this with PCP or endocrinology.  Disposition: F/u with Dr. Fletcher Anon or an APP in 2 months.   Medication Adjustments/Labs and Tests Ordered: Current medicines are reviewed at length with the patient today.  Concerns regarding medicines are outlined above. Medication changes, Labs and Tests ordered today are summarized above and listed in the Patient Instructions accessible in Encounters.   Signed, Christell Faith, PA-C 06/16/2019 11:18 AM     [facility] [address] [place] [address] [place],  [address] [phone]

## 2019-06-16 ENCOUNTER — Ambulatory Visit (INDEPENDENT_AMBULATORY_CARE_PROVIDER_SITE_OTHER): Payer: Medicare Other | Admitting: Physician Assistant

## 2019-06-16 ENCOUNTER — Other Ambulatory Visit: Payer: Self-pay

## 2019-06-16 ENCOUNTER — Encounter: Payer: Self-pay | Admitting: Physician Assistant

## 2019-06-16 VITALS — BP 160/70 | HR 76 | Ht 68.0 in | Wt 246.0 lb

## 2019-06-16 DIAGNOSIS — I471 Supraventricular tachycardia, unspecified: Secondary | ICD-10-CM

## 2019-06-16 DIAGNOSIS — I248 Other forms of acute ischemic heart disease: Secondary | ICD-10-CM | POA: Diagnosis not present

## 2019-06-16 DIAGNOSIS — E785 Hyperlipidemia, unspecified: Secondary | ICD-10-CM

## 2019-06-16 DIAGNOSIS — I10 Essential (primary) hypertension: Secondary | ICD-10-CM

## 2019-06-16 DIAGNOSIS — I2510 Atherosclerotic heart disease of native coronary artery without angina pectoris: Secondary | ICD-10-CM | POA: Diagnosis not present

## 2019-06-16 DIAGNOSIS — I5032 Chronic diastolic (congestive) heart failure: Secondary | ICD-10-CM

## 2019-06-16 DIAGNOSIS — E119 Type 2 diabetes mellitus without complications: Secondary | ICD-10-CM

## 2019-06-16 DIAGNOSIS — Z794 Long term (current) use of insulin: Secondary | ICD-10-CM

## 2019-06-16 DIAGNOSIS — I2489 Other forms of acute ischemic heart disease: Secondary | ICD-10-CM

## 2019-06-16 MED ORDER — AMLODIPINE BESYLATE 10 MG PO TABS: 10.0000 mg | ORAL_TABLET | Freq: Every day | ORAL | 3 refills | Status: DC

## 2019-06-16 NOTE — Telephone Encounter (Signed)
Patient seen today 06/16/19 @ 11am

## 2019-06-16 NOTE — Patient Instructions (Signed)
Medication Instructions:  1- INCREASE Amlodipine Take 1 tablet (10 mg total) by mouth daily. *If you need a refill on your cardiac medications before your next appointment, please call your pharmacy*  Lab Work: 1- Your physician recommends that you have lab work today(Lipid, CMP)  If you have labs (blood work) drawn today and your tests are completely normal, you will receive your results only by: Marland Kitchen MyChart Message (if you have MyChart) OR . A paper copy in the mail If you have any lab test that is abnormal or we need to change your treatment, we will call you to review the results.  Testing/Procedures: 1- A zio monitor was placed today. It will remain on for 14 days. You will then return monitor and event diary in provided box. It takes 1-2 weeks for report to be downloaded and returned to [place]. We will call you with the results. If monitor falls of or has orange flashing light, please call Zio for further instructions.   2- Your cardiac CT will be scheduled at one of the below locations:   [facility] [address] [place], [place] [address] [phone] [place] [address] [place], [place] [address] [phone]  If scheduled at [facility], please arrive at the [facility] main entrance of [facility] 30-45 minutes prior to test start time. Proceed to the [facility] Radiology Department (first floor) to check-in and test prep.  If scheduled at [facility], please arrive 15 mins early for check-in and test prep.  Please follow these instructions carefully (unless otherwise directed):  Hold all erectile dysfunction medications at least 3 days (72 hrs) prior to test.  On the Night Before the Test: . Be sure to Drink plenty of water. . Do not consume any caffeinated/decaffeinated beverages or chocolate 12 hours prior to your test. . Do not take any antihistamines 12  hours prior to your test. . If the patient has contrast allergy: ? Patient will need a prescription for Prednisone and very clear instructions (as follows): 1. Prednisone 50 mg - take 13 hours prior to test 2. Take another Prednisone 50 mg 7 hours prior to test 3. Take another Prednisone 50 mg 1 hour prior to test 4. Take Benadryl 50 mg 1 hour prior to test . Patient must complete all four doses of above prophylactic medications. . Patient will need a ride after test due to Benadryl.  On the Day of the Test: . Drink plenty of water. Do not drink any water within one hour of the test. . Do not eat any food 4 hours prior to the test. . You may take your regular medications prior to the test.  . Take metoprolol (Lopressor) two hours prior to test. . HOLD Furosemide/Hydrochlorothiazide morning of the test. . FEMALES- please wear underwire-free bra if available   *For Clinical Staff only. Please instruct patient the following:*        -Drink plenty of water       -Hold Furosemide/hydrochlorothiazide morning of the test       -Take metoprolol (Lopressor) 2 hours prior to test (if applicable).                  -If HR is less than 55 BPM- No Beta Blocker                -IF HR is greater than 55 BPM and patient is  less than or equal to 33 yrs old Lopressor 100mg  x1.                -If HR is greater than 55 BPM and patient is greater than 80 yrs old Lopressor 50 mg x1.     Do not give Lopressor to patients with an allergy to lopressor or anyone with asthma or active COPD symptoms (currently taking steroids).       After the Test: . Drink plenty of water. . After receiving IV contrast, you may experience a mild flushed feeling. This is normal. . On occasion, you may experience a mild rash up to 24 hours after the test. This is not dangerous. If this occurs, you can take Benadryl 25 mg and increase your fluid intake. . If you experience trouble breathing, this can be serious. If it is severe  call 911 IMMEDIATELY. If it is mild, please call our office. . If you take any of these medications: Glipizide/Metformin, Avandament, Glucavance, please do not take 48 hours after completing test unless otherwise instructed.   Once we have confirmed authorization from your insurance company, we will call you to set up a date and time for your test.   For non-scheduling related questions, please contact the cardiac imaging nurse navigator should you have any questions/concerns: Marchia Bond, RN Navigator Cardiac Imaging [facility] [phone] Office      Follow-Up: At [facility], you and your health needs are our priority.  As part of our continuing mission to provide you with exceptional heart care, we have created designated Provider Care Teams.  These Care Teams include your primary Cardiologist (physician) and Advanced Practice Providers (APPs -  Physician Assistants and Nurse Practitioners) who all work together to provide you with the care you need, when you need it.  Your next appointment:   2 months  The format for your next appointment:   In Person  Provider:    You may see Kathlyn Sacramento, MD or Christell Faith, PA-C.    Other Instructions 1- Ref to [place]

## 2019-06-17 ENCOUNTER — Telehealth: Payer: Self-pay

## 2019-06-17 DIAGNOSIS — I739 Peripheral vascular disease, unspecified: Secondary | ICD-10-CM

## 2019-06-17 LAB — LIPID PANEL
Chol/HDL Ratio: 5.4 ratio — ABNORMAL HIGH (ref 0.0–5.0)
Cholesterol, Total: 151 mg/dL (ref 100–199)
HDL: 28 mg/dL — ABNORMAL LOW (ref >39)
LDL Chol Calc (NIH): 88 mg/dL (ref 0–99)
Triglycerides: 205 mg/dL — ABNORMAL HIGH (ref 0–149)
VLDL Cholesterol Cal: 35 mg/dL (ref 5–40)

## 2019-06-17 LAB — COMPREHENSIVE METABOLIC PANEL
ALT: 59 IU/L — ABNORMAL HIGH (ref 0–44)
AST: 50 IU/L — ABNORMAL HIGH (ref 0–40)
Albumin/Globulin Ratio: 1.1 — ABNORMAL LOW (ref 1.2–2.2)
Albumin: 4.1 g/dL (ref 3.8–4.8)
Alkaline Phosphatase: 102 IU/L (ref 39–117)
BUN/Creatinine Ratio: 10 (ref 10–24)
BUN: 15 mg/dL (ref 8–27)
Bilirubin Total: 0.7 mg/dL (ref 0.0–1.2)
CO2: 22 mmol/L (ref 20–29)
Calcium: 9.8 mg/dL (ref 8.6–10.2)
Chloride: 98 mmol/L (ref 96–106)
Creatinine, Ser: 1.45 mg/dL — ABNORMAL HIGH (ref 0.76–1.27)
GFR calc Af Amer: 59 mL/min/{1.73_m2} — ABNORMAL LOW (ref >59)
GFR calc non Af Amer: 51 mL/min/{1.73_m2} — ABNORMAL LOW (ref >59)
Globulin, Total: 3.8 g/dL (ref 1.5–4.5)
Glucose: 162 mg/dL — ABNORMAL HIGH (ref 65–99)
Potassium: 4.7 mmol/L (ref 3.5–5.2)
Sodium: 138 mmol/L (ref 134–144)
Total Protein: 7.9 g/dL (ref 6.0–8.5)

## 2019-06-17 NOTE — Telephone Encounter (Signed)
Patient's liver function was mildly elevated during his admission with AST downtrending, likely stemming from his recent infection.  ALT is mildly elevated and nonspecific.  Await coronary CTA and ZIO results.  Does the patient need any thing else at this time?

## 2019-06-17 NOTE — Telephone Encounter (Signed)
-----   Message from Rise Mu, PA-C sent at 06/17/2019 10:10 AM EST ----- LDL is above goal at 88.  Triglycerides are elevated (patient reported he was fasting) Random glucose is elevated with known diabetes - follow up with PCP as directed Renal function is mildly elevated Potassium is at goal. Liver function is elevated (one test is improved)  For now, recommend we continue Lipitor. We will not escalate this medication at this time given elevated LFTs.  Recommend we repeat LFT in 4 weeks to reassess. If LFT remains elevated at that time, we will need to hold Lipitor and he will need further follow up with his PCP.

## 2019-06-17 NOTE — Telephone Encounter (Signed)
Call to patient to discuss lab results and change in POC.   Pt will need repeat labs in 4 weeks.  Given contrast use in CT pt will need to wait until after lab redraw for CT.   Zio heart monitor ordered, coming to pt home. They will go ahead and place monitor upon receipt.   Pt denies any chest pain or SOB at this time. He will call back if sx return or excalate.   Routing to provider to further advise if any other changes are needed.   Returned call to patient. They wanted to verify that no medication changes were needed based on lab results. I let them know that no medication changes were requested at this time.

## 2019-06-17 NOTE — Telephone Encounter (Signed)
Patient's wife had some more questions regarding pt medications. Please call to discuss.

## 2019-06-18 ENCOUNTER — Encounter: Payer: Self-pay | Admitting: Internal Medicine

## 2019-06-18 ENCOUNTER — Telehealth: Payer: Self-pay | Admitting: Endocrinology

## 2019-06-18 NOTE — Assessment & Plan Note (Signed)
Resolved, For questions or updates, please contact [place] Please consult [email or web address] for contact info under   same tx,  to f/u any worsening symptoms or concerns

## 2019-06-18 NOTE — Assessment & Plan Note (Signed)
stable overall by history and exam, recent data reviewed with pt, and pt to continue medical treatment as before,  to f/u any worsening symptoms or concerns  

## 2019-06-18 NOTE — Assessment & Plan Note (Signed)
Ok for losartan 100 gm, and increased amldoipine to 7.5 mg daily, cont to monitor at home and next visit

## 2019-06-18 NOTE — Telephone Encounter (Signed)
please contact patient: Please increase insulin to 200 units qam  I got a call about one of your patients last night, Neita Goodnight, but I can't find the right one in Epic. They told me he had recently been admitted with sepsis and that he could expect his sugars to run high, but apparently not how high. He increased his home Lantus dose (on his own) from 150 --> 175 units and last night his sugar was 538. He took 4 units Novolog that brought sugar down to 461. He was asymptomatic. His Metformin was stopped during his hospitalization. I advised another 4 units of Novolog and to call if sugars were still above 350. I did not hear back.

## 2019-06-20 ENCOUNTER — Ambulatory Visit (INDEPENDENT_AMBULATORY_CARE_PROVIDER_SITE_OTHER): Payer: Medicare Other | Admitting: Endocrinology

## 2019-06-20 ENCOUNTER — Encounter: Payer: Medicare Other | Attending: Physician Assistant | Admitting: Dietician

## 2019-06-20 ENCOUNTER — Encounter: Payer: Self-pay | Admitting: Dietician

## 2019-06-20 ENCOUNTER — Other Ambulatory Visit: Payer: Self-pay

## 2019-06-20 ENCOUNTER — Encounter: Payer: Self-pay | Admitting: Endocrinology

## 2019-06-20 VITALS — Ht 68.0 in | Wt 244.0 lb

## 2019-06-20 DIAGNOSIS — E119 Type 2 diabetes mellitus without complications: Secondary | ICD-10-CM

## 2019-06-20 DIAGNOSIS — I10 Essential (primary) hypertension: Secondary | ICD-10-CM | POA: Diagnosis not present

## 2019-06-20 DIAGNOSIS — E1151 Type 2 diabetes mellitus with diabetic peripheral angiopathy without gangrene: Secondary | ICD-10-CM | POA: Diagnosis not present

## 2019-06-20 DIAGNOSIS — E118 Type 2 diabetes mellitus with unspecified complications: Secondary | ICD-10-CM | POA: Insufficient documentation

## 2019-06-20 DIAGNOSIS — Z794 Long term (current) use of insulin: Secondary | ICD-10-CM

## 2019-06-20 DIAGNOSIS — E669 Obesity, unspecified: Secondary | ICD-10-CM | POA: Diagnosis not present

## 2019-06-20 DIAGNOSIS — E785 Hyperlipidemia, unspecified: Secondary | ICD-10-CM | POA: Insufficient documentation

## 2019-06-20 DIAGNOSIS — Z6837 Body mass index (BMI) 37.0-37.9, adult: Secondary | ICD-10-CM | POA: Diagnosis not present

## 2019-06-20 MED ORDER — TOUJEO MAX SOLOSTAR 300 UNIT/ML ~~LOC~~ SOPN: 175.0000 [IU] | PEN_INJECTOR | SUBCUTANEOUS | 11 refills | Status: DC

## 2019-06-20 MED ORDER — INSULIN GLARGINE 100 UNIT/ML ~~LOC~~ SOLN: 175.0000 [IU] | SUBCUTANEOUS | 11 refills | Status: DC

## 2019-06-20 NOTE — Progress Notes (Signed)
Medical Nutrition Therapy: Visit start time: [id number]  end time: 1215  Assessment:  Diagnosis: hyperlipidemia, Type 2 diabetes Past medical history: HTN Psychosocial issues/ stress concerns: concern over health issues  Preferred learning method:  . Auditory   Current weight: 244.0lbs Height: 5'8" Medications, supplements: reconciled list in medical record  Progress and evaluation:   Patient reports recent increase in BGs especially since becoming ill with septic infection. He reports fasting BGs in 130s, but often increase after breakfast.   He and his wife report stress and anxiety in not knowing what he should be eating, and how to improve his BG control.   Recent labs indicate total cholesterol of 151 mg/dl, LDL 88, HDL 28, Triglycerides 205. HbA1C of 8.9%  Patient reports recent weight loss of over 20lbs.   Physical activity: no structured activity due to disability and recent illness; has been doing some gardening, raising fall vegetables.   Dietary Intake:  Usual eating pattern includes 3 meals and 1-2 snacks per day. Dining out frequency: 2 meals per week.  Breakfast: cereal ie cheerios or rice krispies, small bowl Snack: occasionally cookie or chips (oreos don't make BG go up) Lunch: chicken or tuna salad on ritz or celery; grilled chicken, leaf lettuce Snack: none or same as am Supper: grilled chicken with collard greens and squash Snack: none or same as am Beverages: diet soda, coffee, does not like plain water, light juice  Nutrition Care Education: Topics covered:  Basic nutrition: basic food groups, appropriate nutrient balance, appropriate meal and snack schedule, general nutrition guidelines    Weight control: importance of low sugar and low fat choices, portion control, estimated energy needs for weight loss at 1600kcal, provided guidance for 45% CHO, 25% protein, and 30% fat. Advanced nutrition:  food label reading Diabetes:  appropriate meal and snack schedule,  appropriate carb intake and balance, healthy carb choices, role of fiber, protein, fat; non-food factors that affect BGs as well as lipids, including stress, activity, illness; dosing and timing of insulin types.  Hyperlipidemia:  target goals for lipids, healthy and unhealthy fats, role of fiber, plant sterols, role of exercise   Nutritional Diagnosis:  [place]-2.2 Altered nutrition-related laboratory As related to hyperlipidemia and diabetes.  As evidenced by elevated HbA1C and Triglycerides, low HDL. -3.3 Overweight/obesity As related to excess calories and inadequate physical activity.  As evidenced by patient with current BMI of 37.  Intervention:   Instruction and discussion as noted above.  Patient has been working on positive diet changes with support from his spouse.   Established goals for achieving balalnced nutritional intake and controlled carb amounts. Encouraged gradual and manageable changes.   Patient/ wife will call to schedule follow-up after working with MD on further insulin adjustment(s).   Education Materials given:  . Plate Planner with food lists, sample meal pattern . Sample menus . Snacking handout . Goals/ instructions   Learner/ who was taught:  . Patient  . Spouse/ partner   Level of understanding: Marland Kitchen Verbalizes/ demonstrates competency   Demonstrated degree of understanding via:   Teach back Learning barriers: . None  Willingness to learn/ readiness for change: . Eager, change in progress  Monitoring and Evaluation:  Dietary intake, exercise, blood lipids, BG control, and body weight      follow up: prn

## 2019-06-20 NOTE — Progress Notes (Signed)
Subjective:    Patient ID: [name], male    DOB: 1956/05/03, 63 y.o.   MRN: [id number]  HPI telehealth visit today via phone x 12 minutes. Alternatives to telehealth are presented to this patient, and the patient agrees to the telehealth visit.   Pt is advised of the cost of the visit, and agrees to this, also.   Patient is at home, and I am at the office.   Persons attending the telehealth visit: the patient and I.   Pt returns for f/u of diabetes mellitus: DM type: Insulin-requiring type 2.   Dx'ed: 0000000 Complications: PAD, renal insuff, and polyneuropathy.   Therapy: insulin since 2009 DKA: never Severe hypoglycemia: never.  Pancreatitis: never.  Other: he takes a QD insulin regimen, after poor results with more frequent injections.   Interval history: He takes Lantus, 150 units qam.  He says cbg today is approx 200.  There is no trend throughout the day.  He has 3 more Lantus pens to use up.  Past Medical History:  Diagnosis Date  . Allergic rhinitis   . At risk for sleep apnea    STOP-BANG= 5   SENT TO PCP 03-14-2014  . Cataract    surgically removed bilateral  . Condyloma acuminatum of penis   . Diabetic neuropathy ([place])   . GERD (gastroesophageal reflux disease)   . History of bladder cancer    s/p  turbt  2013/   transitional cell carcinoma--   . History of condyloma acuminatum    PERINEAL AREA  W/ RECURRENCY  . History of gout   . Hyperlipidemia   . Hypertension   . Lower urinary tract symptoms (LUTS)   . Productive cough   . PVD (peripheral vascular disease) with claudication ([facility])    bilateral SFA disease-- right > left  and left tibial artery disease--  per duplex  . Smokers' cough ([place])   . Type 2 diabetes mellitus with insulin therapy ([place])    monitor by  dr ellsion  . Wears dentures    upper    Past Surgical History:  Procedure Laterality Date  . AXILLARY HIDRADENITIS EXCISION  1997  . CARDIOVASCULAR STRESS TEST  07-24-2014  dr Kathlyn [place]    Low risk lexiscan nuclear study with apical thinning and small inferolateral wall infarct at mid & basal level , no ischemia/  normal LVF and wall motion , ef 59%  . CATARACT EXTRACTION Left   . CATARACT EXTRACTION W/ INTRAOCULAR LENS IMPLANT Right   . CO2 LASER APPLICATION N/A 0000000   Procedure: CO2 LASER APPLICATION,PENIS, GROIN, ANUS;  Surgeon: [name], MD;  Location: [place];  Service: General;  Laterality: N/A;  . CO2 LASER APPLICATION N/A [id number]   Procedure: CO2 LASER APPLICATION;  Surgeon: Kathie Rhodes, MD;  Location: [facility];  Service: Urology;  Laterality: N/A;  . CONDYLOMA EXCISION/FULGURATION N/A 05/21/2015   Procedure: CONDYLOMA REMOVAL;  Surgeon: Kathie Rhodes, MD;  Location: [facility];  Service: Urology;  Laterality: N/A;  . HEMORRHOID SURGERY  10/24/2014   Procedure: HEMORRHOIDECTOMY;  Surgeon: Michael Boston, MD;  Location: [facility];  Service: General;;  . INCISION AND DRAINAGE ABSCESS Left 10/24/2014   Procedure: INCISION AND DRAINAGE ABSCESS;  Surgeon: Michael Boston, MD;  Location: [facility];  Service: General;  Laterality: Left;  . INGUINAL HIDRADENITIS EXCISION  1998, 1999  . LASER ABLATION CONDOLAMATA N/A 03/20/2014   Procedure: EXAM UNDER ANESTHESIA,  REMOVAL/ABLATION OF CONDYLOMATA PENIS,GROINS, ANUS, ANAL CANAL;  Surgeon: [name], MD;  Location: [place];  Service: General;  Laterality: N/A;  groin and anus  . LASER ABLATION CONDOLAMATA N/A 10/24/2014   Procedure: LASER ABLATION CONDOLAMATA;  Surgeon: Michael Boston, MD;  Location: [facility];  Service: General;  Laterality: N/A;  . LASER ABLATION OF PENILE AND PERIANAL WARTS  07-29-2007  Dr. Johney Maine  . LEFT SHOULDER SURGERY  2003  . MASS EXCISION N/A 10/24/2014   Procedure: EXCISION OF PERINEAL MASS/SINUS;  Surgeon: Michael Boston, MD;  Location: [place];  Service:  General;  Laterality: N/A;  . MOHS SURGERY     back  . MOHS SURGERY  2017   face  . MULTIPLE TOOTH EXTRACTIONS    . PERINEAL HIDRADENITIS EXCISION  1998, 1999  . TRANSURETHRAL RESECTION OF BLADDER TUMOR  05/21/2012   Procedure: TRANSURETHRAL RESECTION OF BLADDER TUMOR (TURBT);  Surgeon: Claybon Jabs, MD;  Location: [facility];  Service: Urology;  Laterality: N/A;       Social History   Socioeconomic History  . Marital status: Married    Spouse name: Not on file  . Number of children: Not on file  . Years of education: Not on file  . Highest education level: Not on file  Occupational History  . Occupation: Disabled  Social Needs  . Financial resource strain: Not on file  . Food insecurity    Worry: Not on file    Inability: Not on file  . Transportation needs    Medical: Not on file    Non-medical: Not on file  Tobacco Use  . Smoking status: Former Smoker    Packs/day: 1.00    Years: 41.00    Pack years: 41.00    Types: Cigars, Cigarettes    Quit date: 04/11/2016    Years since quitting: 3.1  . Smokeless tobacco: Never Used  Substance and Sexual Activity  . Alcohol use: No    Alcohol/week: 0.0 standard drinks  . Drug use: No  . Sexual activity: Not on file  Lifestyle  . Physical activity    Days per week: Not on file    Minutes per session: Not on file  . Stress: Not on file  Relationships  . Social Herbalist on phone: Not on file    Gets together: Not on file    Attends religious service: Not on file    Active member of club or organization: Not on file    Attends meetings of clubs or organizations: Not on file    Relationship status: Not on file  . Intimate partner violence    Fear of current or ex partner: Not on file    Emotionally abused: Not on file    Physically abused: Not on file    Forced sexual activity: Not on file  Other Topics Concern  . Not on file  Social History Narrative  . Not on file    Current  Outpatient Medications on File Prior to Visit  Medication Sig Dispense Refill  . acetaminophen (TYLENOL) 500 MG tablet Take 500 mg by mouth every 6 (six) hours as needed.    Marland Kitchen amLODipine (NORVASC) 10 MG tablet Take 1 tablet (10 mg total) by mouth daily. 90 tablet 3  . aspirin EC 81 MG tablet Take 1 tablet (81 mg total) by mouth daily.    Marland Kitchen atorvastatin (LIPITOR) 20 MG tablet Take 1 tablet (  20 mg total) by mouth daily. 90 tablet 3  . hydrochlorothiazide (HYDRODIURIL) 25 MG tablet Take 1 tablet (25 mg total) by mouth daily. 90 tablet 3  . loratadine (CLARITIN) 10 MG tablet Take 10 mg by mouth daily.    Marland Kitchen losartan (COZAAR) 100 MG tablet Take 1 tablet (100 mg total) by mouth daily. 90 tablet 3  . pantoprazole (PROTONIX) 40 MG tablet Take 1 tablet (40 mg total) by mouth daily. 90 tablet 3  . potassium chloride 20 MEQ TBCR Take 20 mEq by mouth daily. 30 tablet 6  . Vitamin D, Ergocalciferol, (DRISDOL) 1.25 MG (50000 UT) CAPS capsule Take 1 capsule (50,000 Units total) by mouth every 7 (seven) days. (Patient taking differently: Take 50,000 Units by mouth every Friday. ) 12 capsule 0   No current facility-administered medications on file prior to visit.     Allergies  Allergen Reactions  . Cefepime Hives    Had allergic reaction to one of these 3 agents, unclear which one  . Metoprolol Hives    Had allergic reaction to one of these 3 agents, unclear which one  *Per RN, highly likely this is the cause of allergic rxn  . Myrbetriq [Mirabegron] Hives    Had allergic reaction to one of these 3 agents, unclear which one    Family History  Problem Relation Age of Onset  . Hypertension Mother   . Cancer Father        lung ca  . Diabetes Maternal Aunt        x 2  . Colon cancer Neg Hx   . Esophageal cancer Neg Hx   . Pancreatic cancer Neg Hx   . Prostate cancer Neg Hx   . Kidney disease Neg Hx   . Liver disease Neg Hx   . Lung cancer Neg Hx   . Rectal cancer Neg Hx   . Stomach cancer Neg  Hx     There were no vitals taken for this visit.   Review of Systems He denies hypoglycemia.  He has lost 20 lbs x 1 month.      Objective:   Physical Exam    Lab Results  Component Value Date   CREATININE 1.45 (H) 06/16/2019   BUN 15 06/16/2019   NA 138 06/16/2019   K 4.7 06/16/2019   CL 98 06/16/2019   CO2 22 06/16/2019      Assessment & Plan:  Insulin-requiring type 2 DM: he needs increased rx.  Plan is to change to [facility] when he runs out of Lantus.    Patient Instructions  Please increase Lantus to 175 units each morning.  Today only, take an extra 25 units this afternoon.   With time, your need for insulin will increase back up to what it was before you got sick.   I have sent a prescription to your pharmacy, to change Lantus to Toujeo (concentrated).   check your blood sugar twice a day.  vary the time of day when you check, between before the 3 meals, and at bedtime.  also check if you have symptoms of your blood sugar being too high or too low.  please keep a record of the readings and bring it to your next appointment here (or you can bring the meter itself).  You can write it on any piece of paper.  please call us sooner if your blood sugar goes below 70, or if you have a lot of readings over 200.   Please  come back for a follow-up appointment as scheduled.

## 2019-06-20 NOTE — Telephone Encounter (Signed)
Called pt and informed him of Dr. Cordelia Pen orders. Pt expressed confusion, stating he is not on Lantus and that it needs "sent back". Pt then proceeded to state that he needs to know what medication Dr. Loanne Drilling recommended at his ;ast appt. To reduce confusion, pt was scheduled for phone visit with Dr. Loanne Drilling today @ 245pm

## 2019-06-20 NOTE — Patient Instructions (Signed)
   Use menus to plan and eat balanced meals.  Continue to stay in close contact with doctor to manage insulin.  Gradually increase some light exercise if able.

## 2019-06-20 NOTE — Patient Instructions (Addendum)
Please increase Lantus to 175 units each morning.  Today only, take an extra 25 units this afternoon.   With time, your need for insulin will increase back up to what it was before you got sick.   I have sent a prescription to your pharmacy, to change Lantus to Toujeo (concentrated).   check your blood sugar twice a day.  vary the time of day when you check, between before the 3 meals, and at bedtime.  also check if you have symptoms of your blood sugar being too high or too low.  please keep a record of the readings and bring it to your next appointment here (or you can bring the meter itself).  You can write it on any piece of paper.  please call us sooner if your blood sugar goes below 70, or if you have a lot of readings over 200.   Please come back for a follow-up appointment as scheduled.

## 2019-06-22 ENCOUNTER — Telehealth: Payer: Self-pay | Admitting: *Deleted

## 2019-06-22 MED ORDER — IVABRADINE HCL 5 MG PO TABS: ORAL_TABLET | ORAL | 0 refills | Status: DC

## 2019-06-22 NOTE — Telephone Encounter (Signed)
-----   Message from [name], [place] sent at 06/22/2019  4:03 PM EST ----- Please send in Corlanor 5 mg to be taken 2 hours prior to coronary CTA. This will be taken in place of metoprolol given his allergy.

## 2019-06-22 NOTE — Telephone Encounter (Signed)
I received forms via fax. They have been completed for dates 10/24 to 11/30. Forms have been placed in providers box to review and sign.

## 2019-06-22 NOTE — Telephone Encounter (Signed)
Spoke with patient and wife. Before I could give the instructions regarding the Corlanor, wife said the CT was on hold due to patient's lab work.   I referred to telephone note on 06/17/19 but I am unclear if the ct needs to be on hold. Advised I would route to [facility] for clarification.  Also, she verbalized understanding for patient to take Corlanor 2 hours prior to the procedure.  Rx sent to pharmacy.

## 2019-06-23 DIAGNOSIS — Z0279 Encounter for issue of other medical certificate: Secondary | ICD-10-CM

## 2019-06-23 NOTE — Telephone Encounter (Signed)
Spoke with patients wife per release form and reviewed provider recommendations for his plan of care. She verbalized understanding with no further questions at this time.

## 2019-06-23 NOTE — Telephone Encounter (Signed)
Forms have been signed, Faxed to [facility] @[phone], Copy sent to scan & Charged for.   Wife informed, Original mailed.

## 2019-06-23 NOTE — Telephone Encounter (Signed)
[name], patient is now wearing Zio first given mildly elevated SCr. He will follow up with a BMET in early 07/2019 (after completion of Zio). Then pending renal function, can proceed with coronary CTA. Please make sure he is staying well hydrated.

## 2019-06-24 ENCOUNTER — Telehealth: Payer: Self-pay | Admitting: Cardiovascular Disease

## 2019-06-24 ENCOUNTER — Encounter: Payer: Self-pay | Admitting: Cardiovascular Disease

## 2019-06-24 NOTE — Telephone Encounter (Signed)
Patient spouse calling  Patient just received heart monitor in the mail - will need assistance putting it on  Says they are coming to the hospital on Tuesday and would like to know if it can be placed then Please call to discuss

## 2019-06-28 ENCOUNTER — Ambulatory Visit: Payer: Self-pay | Admitting: Surgery

## 2019-06-28 ENCOUNTER — Other Ambulatory Visit: Payer: Self-pay

## 2019-06-28 ENCOUNTER — Ambulatory Visit (INDEPENDENT_AMBULATORY_CARE_PROVIDER_SITE_OTHER): Payer: Medicare Other

## 2019-06-28 DIAGNOSIS — I471 Supraventricular tachycardia: Secondary | ICD-10-CM

## 2019-06-28 DIAGNOSIS — Z9114 Patient's other noncompliance with medication regimen: Secondary | ICD-10-CM | POA: Insufficient documentation

## 2019-06-28 DIAGNOSIS — L732 Hidradenitis suppurativa: Secondary | ICD-10-CM | POA: Diagnosis not present

## 2019-06-28 DIAGNOSIS — I10 Essential (primary) hypertension: Secondary | ICD-10-CM | POA: Diagnosis not present

## 2019-06-28 MED ORDER — DEXTROSE 5 % IV SOLN: 600.0000 mg | INTRAVENOUS | Status: AC

## 2019-06-28 MED ORDER — GENTAMICIN SULFATE 40 MG/ML IJ SOLN: 100.0000 mg | INTRAVENOUS | Status: AC

## 2019-06-28 NOTE — H&P (Signed)
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 make sure they agree on the dosing. last known recommended 175 units a day. Patient went from 150 to only 160 only. I'll defer to them. Tight glucose control will help minimize further skin problems.

## 2019-07-04 ENCOUNTER — Other Ambulatory Visit
Admission: RE | Admit: 2019-07-04 | Discharge: 2019-07-04 | Disposition: A | Discharge status: Home / Self Care | Payer: Medicare Other | Attending: Cardiovascular Disease | Admitting: Cardiovascular Disease

## 2019-07-04 ENCOUNTER — Telehealth: Payer: Self-pay | Admitting: Cardiovascular Disease

## 2019-07-04 ENCOUNTER — Other Ambulatory Visit: Payer: Self-pay

## 2019-07-04 ENCOUNTER — Telehealth: Payer: Self-pay | Admitting: Physician Assistant

## 2019-07-04 DIAGNOSIS — E876 Hypokalemia: Secondary | ICD-10-CM | POA: Diagnosis not present

## 2019-07-04 DIAGNOSIS — I10 Essential (primary) hypertension: Secondary | ICD-10-CM

## 2019-07-04 LAB — BASIC METABOLIC PANEL
Anion gap: 12 (ref 5–15)
BUN: 14 mg/dL (ref 8–23)
CO2: 27 mmol/L (ref 22–32)
Calcium: 9.5 mg/dL (ref 8.9–10.3)
Chloride: 100 mmol/L (ref 98–111)
Creatinine, Ser: 1 mg/dL (ref 0.61–1.24)
GFR calc Af Amer: >60 mL/min (ref >60)
GFR calc non Af Amer: >60 mL/min (ref >60)
Glucose, Bld: 200 mg/dL — ABNORMAL HIGH (ref 70–99)
Potassium: 3.8 mmol/L (ref 3.5–5.1)
Sodium: 139 mmol/L (ref 135–145)

## 2019-07-04 MED ORDER — POTASSIUM CHLORIDE 20 MEQ/15ML (10%) PO SOLN: 10.0000 meq | Freq: Every day | ORAL | 5 refills | Status: DC

## 2019-07-04 NOTE — Telephone Encounter (Signed)
   Primary Cardiologist:Muhammad Fletcher Anon, MD  Chart reviewed as part of pre-operative protocol coverage. The patient was recently hospitalized for UTI/pyelonephritis and also treated for hypertensive urgency. HS troponin was elevated and non-specific EKG changes noted which were felt to be related to demand ischemia. He also had narrow complex tachycardia in the setting of electrolyte abnormalities. He was have dyspnea at the time which improved with improvement in blood pressure.   He was seen in the office on 06/16/2019 for follow up. He is planned for cardiac CTA to fully evaluate coronary arteries scheduled for 12/22. He is also wearing a cardiac monitor to further evaluate possible arrhythmias, to finish on 12/1.   I called and spoke with the patient and his wife. They report that blood pressures have been much better, 130's-140's. He is having no chest pain. He still has mild DOE but not nearly to the extent as with the uncontrolled HTN. They report that they do not think the surgery is urgent and say that the patient is planning to complete cardiac workup prior to undergoing surgery.   I will leave this request in our Preop box to be followed up after CCTA on 12/22 and results of cardiac monitor.   I will send this note to the requesting provider via Epic fax function. Please call with questions.   Daune Perch, NP 07/04/2019, 4:37 PM

## 2019-07-04 NOTE — Telephone Encounter (Signed)
Patient last seen by Christell Faith, PA on 06/16/19. Message fwd to [place] to advise.

## 2019-07-04 NOTE — Telephone Encounter (Signed)
Patient wife states patient needs a different potassium medication, patient only has 1 of his current pills left. Please call to discuss different medicaiton. States pt discussed this with Dr. Fletcher Anon at his last visit.

## 2019-07-04 NOTE — Addendum Note (Signed)
Addended by: Lamar Laundry on: 07/04/2019 02:51 PM   Modules accepted: Orders

## 2019-07-04 NOTE — Telephone Encounter (Signed)
Potassium 10 meq packet not located in [employer] smallest dosing available is 60meq and it is listed as a Tier 3.  Spoke with Thurmond Butts ok to order Potassium 71meq suspension daily. Rx sent to the patient's pharmacy. Order in [place] for 1 week repeat bmet [facility].  Spoke with the patient's wife and made her aware of [name]'s instructions documented above.

## 2019-07-04 NOTE — Telephone Encounter (Signed)
It looks like patient's labs came to Dr. Tyrell Antonio in basket rather than mine.  Potassium slightly below goal at 3.8, renal function improved and normal.  Random glucose elevated.  Please prescribe potassium chloride packet 10 mEq daily.  Recheck BMET in 1 week to ensure stable potassium.

## 2019-07-04 NOTE — Addendum Note (Signed)
Addended by: Lamar Laundry on: 07/04/2019 03:24 PM   Modules accepted: Orders

## 2019-07-04 NOTE — Telephone Encounter (Signed)
° °  [facility] Pre-operative Risk Assessment    Request for surgical clearance:  1. What type of surgery is being performed? Hidradenitis Suppurativa s/p wide excisions/ skin grafts of perineum    2. When is this surgery scheduled? TBD but ASAP   3. What type of clearance is required (medical clearance vs. Pharmacy clearance to hold med vs. Both)? Medical   4. Are there any medications that need to be held prior to surgery and how long?not noted    5. Practice name and name of physician performing surgery? [facility] Dr. Johney Maine   6. What is your office phone number [phone]    7.   What is your office fax number [phone]   8.   Anesthesia type (None, local, MAC, general) ? GENERAL    Clarisse Gouge 07/04/2019, 4:01 PM  _________________________________________________________________   (provider comments below)

## 2019-07-04 NOTE — Telephone Encounter (Signed)
Despite patient indicating his potassium pills were being passed through his body whole his potassium nonetheless had improved to 4.7 when it was checked on 11/5.  Given this, to appropriately dose alternative potassium supplement please have him come in for a BMET.

## 2019-07-04 NOTE — Telephone Encounter (Signed)
Spoke with the patient and made him aware of [name], PAsrecommendation. Patient is in agreement with [name]'s plan of care. Patient will [facility] the [facility]. Patient sts that he is out of his prescribed Potassium. If Potassium is going to be continued he will need an Rx sent as asap.

## 2019-07-11 ENCOUNTER — Other Ambulatory Visit
Admission: RE | Admit: 2019-07-11 | Discharge: 2019-07-11 | Disposition: A | Discharge status: Home / Self Care | Payer: Medicare Other | Source: Ambulatory Visit | Attending: Physician Assistant | Admitting: Physician Assistant

## 2019-07-11 ENCOUNTER — Telehealth: Payer: Self-pay

## 2019-07-11 ENCOUNTER — Telehealth: Payer: Self-pay | Admitting: Cardiovascular Disease

## 2019-07-11 DIAGNOSIS — E109 Type 1 diabetes mellitus without complications: Secondary | ICD-10-CM | POA: Diagnosis not present

## 2019-07-11 DIAGNOSIS — E876 Hypokalemia: Secondary | ICD-10-CM | POA: Diagnosis not present

## 2019-07-11 LAB — BASIC METABOLIC PANEL
Anion gap: 13 (ref 5–15)
BUN: 13 mg/dL (ref 8–23)
CO2: 26 mmol/L (ref 22–32)
Calcium: 9.7 mg/dL (ref 8.9–10.3)
Chloride: 97 mmol/L — ABNORMAL LOW (ref 98–111)
Creatinine, Ser: 1.05 mg/dL (ref 0.61–1.24)
GFR calc Af Amer: >60 mL/min (ref >60)
GFR calc non Af Amer: >60 mL/min (ref >60)
Glucose, Bld: 315 mg/dL — ABNORMAL HIGH (ref 70–99)
Potassium: 4 mmol/L (ref 3.5–5.1)
Sodium: 136 mmol/L (ref 135–145)

## 2019-07-11 NOTE — Telephone Encounter (Signed)
Yes doxycycline should be fine.

## 2019-07-11 NOTE — Telephone Encounter (Signed)
Forms for DM supplies filled out and faxed to [facility] with confirmation.

## 2019-07-11 NOTE — Telephone Encounter (Signed)
Spoke with the patient's wife.  The patient has been prescribed Doxycycline by Dr. Johney Maine for treatment of the patient's sebaceous cyst. The patient would like Dr. Tyrell Antonio thoughts on whether it is ok for him to take the prescribed antibiotic. Advised the patient's wife that I will fwd the update to Dr. Fletcher Anon and call back with his response.

## 2019-07-11 NOTE — Telephone Encounter (Signed)
Patient wife calling Patient has an upcoming procedure and would like to know if Dr Fletcher Anon is ok with patient taking antibiotic prescribed by Dr Johney Maine  Please call to discuss

## 2019-07-12 ENCOUNTER — Telehealth: Payer: Self-pay

## 2019-07-12 ENCOUNTER — Other Ambulatory Visit: Payer: Self-pay

## 2019-07-12 NOTE — Telephone Encounter (Signed)
Call returned from wife.   Reviewed results. No further orders at this time.   Advised pt to call for any further questions or concerns.  Pt has f/u with endocrinologist this Thursday.

## 2019-07-12 NOTE — Telephone Encounter (Signed)
-----   Message from Rise Mu, PA-C sent at 07/12/2019  6:56 AM EST ----- Potassium at goal. Kidney function normal. Random glucose significantly elevated.  Follow-up with PCP as directed.

## 2019-07-12 NOTE — Telephone Encounter (Signed)
DPR on file. lmom with Dr. Tyrell Antonio response. Patient is to contact the office if any questions.

## 2019-07-12 NOTE — Telephone Encounter (Signed)
Attempted to call patient. LMTCB 07/12/2019

## 2019-07-14 ENCOUNTER — Encounter: Payer: Self-pay | Admitting: Endocrinology

## 2019-07-14 ENCOUNTER — Ambulatory Visit (INDEPENDENT_AMBULATORY_CARE_PROVIDER_SITE_OTHER): Payer: Medicare Other | Admitting: Endocrinology

## 2019-07-14 VITALS — BP 144/64 | HR 78 | Ht 68.0 in | Wt 243.2 lb

## 2019-07-14 DIAGNOSIS — Z794 Long term (current) use of insulin: Secondary | ICD-10-CM | POA: Diagnosis not present

## 2019-07-14 DIAGNOSIS — E1151 Type 2 diabetes mellitus with diabetic peripheral angiopathy without gangrene: Secondary | ICD-10-CM | POA: Diagnosis not present

## 2019-07-14 NOTE — Progress Notes (Signed)
Subjective:    Patient ID: [name], male    DOB: 1955-08-27, 63 y.o.   MRN: [id number]  HPI Pt returns for f/u of diabetes mellitus: DM type: Insulin-requiring type 2.   Dx'ed: 0000000 Complications: PAD, renal insuff, and polyneuropathy.   Therapy: insulin since 2009.   DKA: never Severe hypoglycemia: never.   Pancreatitis: never.  Other: he takes a QD insulin regimen, after poor results with more frequent injections.   Interval history: He takes Lantus, 150 units qam.  no cbg record, but states cbg's vary from 78-520.  It is in general higher as the day goes on.   Past Medical History:  Diagnosis Date  . Allergic rhinitis   . At risk for sleep apnea    STOP-BANG= 5   SENT TO PCP 03-14-2014  . Cataract    surgically removed bilateral  . Condyloma acuminatum of penis   . Diabetic neuropathy ([place])   . GERD (gastroesophageal reflux disease)   . History of bladder cancer    s/p  turbt  2013/   transitional cell carcinoma--   . History of condyloma acuminatum    PERINEAL AREA  W/ RECURRENCY  . History of gout   . Hyperlipidemia   . Hypertension   . Lower urinary tract symptoms (LUTS)   . Productive cough   . PVD (peripheral vascular disease) with claudication (HCC)    bilateral SFA disease-- right > left  and left tibial artery disease--  per duplex  . Smokers' cough ([place])   . Type 2 diabetes mellitus with insulin therapy ([place])    monitor by  dr ellsion  . Wears dentures    upper    Past Surgical History:  Procedure Laterality Date  . AXILLARY HIDRADENITIS EXCISION  1997  . CARDIOVASCULAR STRESS TEST  07-24-2014  dr Kathlyn [place]   Low risk lexiscan nuclear study with apical thinning and small inferolateral wall infarct at mid & basal level , no ischemia/  normal LVF and wall motion , ef 59%  . CATARACT EXTRACTION Left   . CATARACT EXTRACTION W/ INTRAOCULAR LENS IMPLANT Right   . CO2 LASER APPLICATION N/A 0000000   Procedure: CO2 LASER APPLICATION,PENIS, GROIN,  ANUS;  Surgeon: [name], MD;  Location: [place];  Service: General;  Laterality: N/A;  . CO2 LASER APPLICATION N/A [id number]   Procedure: CO2 LASER APPLICATION;  Surgeon: Kathie Rhodes, MD;  Location: [facility];  Service: Urology;  Laterality: N/A;  . CONDYLOMA EXCISION/FULGURATION N/A 05/21/2015   Procedure: CONDYLOMA REMOVAL;  Surgeon: Kathie Rhodes, MD;  Location: [facility];  Service: Urology;  Laterality: N/A;  . HEMORRHOID SURGERY  10/24/2014   Procedure: HEMORRHOIDECTOMY;  Surgeon: Michael Boston, MD;  Location: [facility];  Service: General;;  . INCISION AND DRAINAGE ABSCESS Left 10/24/2014   Procedure: INCISION AND DRAINAGE ABSCESS;  Surgeon: Michael Boston, MD;  Location: [place];  Service: General;  Laterality: Left;  . INGUINAL HIDRADENITIS EXCISION  1998, 1999  . LASER ABLATION CONDOLAMATA N/A 03/20/2014   Procedure: EXAM UNDER ANESTHESIA, REMOVAL/ABLATION OF CONDYLOMATA PENIS,GROINS, ANUS, ANAL CANAL;  Surgeon: [name], MD;  Location: [place];  Service: General;  Laterality: N/A;  groin and anus  . LASER ABLATION CONDOLAMATA N/A 10/24/2014   Procedure: LASER ABLATION CONDOLAMATA;  Surgeon: Michael Boston, MD;  Location: [place];  Service: General;  Laterality: N/A;  . LASER ABLATION OF PENILE AND  PERIANAL WARTS  07-29-2007  Dr. Johney Maine  . LEFT SHOULDER SURGERY  2003  . MASS EXCISION N/A 10/24/2014   Procedure: EXCISION OF PERINEAL MASS/SINUS;  Surgeon: Michael Boston, MD;  Location: [place];  Service: General;  Laterality: N/A;  . MOHS SURGERY     back  . MOHS SURGERY  2017   face  . MULTIPLE TOOTH EXTRACTIONS    . PERINEAL HIDRADENITIS EXCISION  1998, 1999  . TRANSURETHRAL RESECTION OF BLADDER TUMOR  05/21/2012   Procedure: TRANSURETHRAL RESECTION OF BLADDER TUMOR (TURBT);  Surgeon: Claybon Jabs, MD;  Location: [facility];  Service: Urology;  Laterality: N/A;       Social History   Socioeconomic History  . Marital status: Married    Spouse name: Not on file  . Number of children: Not on file  . Years of education: Not on file  . Highest education level: Not on file  Occupational History  . Occupation: Disabled  Social Needs  . Financial resource strain: Not on file  . Food insecurity    Worry: Not on file    Inability: Not on file  . Transportation needs    Medical: Not on file    Non-medical: Not on file  Tobacco Use  . Smoking status: Former Smoker    Packs/day: 1.00    Years: 41.00    Pack years: 41.00    Types: Cigars, Cigarettes    Quit date: 04/11/2016    Years since quitting: 3.2  . Smokeless tobacco: Never Used  Substance and Sexual Activity  . Alcohol use: No    Alcohol/week: 0.0 standard drinks  . Drug use: No  . Sexual activity: Not on file  Lifestyle  . Physical activity    Days per week: Not on file    Minutes per session: Not on file  . Stress: Not on file  Relationships  . Social Herbalist on phone: Not on file    Gets together: Not on file    Attends religious service: Not on file    Active member of club or organization: Not on file    Attends meetings of clubs or organizations: Not on file    Relationship status: Not on file  . Intimate partner violence    Fear of current or ex partner: Not on file    Emotionally abused: Not on file    Physically abused: Not on file    Forced sexual activity: Not on file  Other Topics Concern  . Not on file  Social History Narrative  . Not on file    Current Outpatient Medications on File Prior to Visit  Medication Sig Dispense Refill  . acetaminophen (TYLENOL) 500 MG tablet Take 500 mg by mouth every 6 (six) hours as needed.    Marland Kitchen amLODipine (NORVASC) 10 MG tablet Take 1 tablet (10 mg total) by mouth daily. 90 tablet 3  . aspirin EC 81 MG tablet Take 1 tablet (81 mg total) by mouth daily.    Marland Kitchen  atorvastatin (LIPITOR) 20 MG tablet Take 1 tablet (20 mg total) by mouth daily. 90 tablet 3  . hydrochlorothiazide (HYDRODIURIL) 25 MG tablet Take 1 tablet (25 mg total) by mouth daily. 90 tablet 3  . Insulin Glargine, 2 Unit Dial, (TOUJEO MAX SOLOSTAR) 300 UNIT/ML SOPN Inject 175 Units into the skin every morning. And pen needles 1/day 6 pen 11  . ivabradine (CORLANOR) 5 MG TABS tablet Take 1  tablet (5 mg) by mouth once 2 hours prior to procedure. 1 tablet 0  . loratadine (CLARITIN) 10 MG tablet Take 10 mg by mouth daily.    Marland Kitchen losartan (COZAAR) 100 MG tablet Take 1 tablet (100 mg total) by mouth daily. 90 tablet 3  . pantoprazole (PROTONIX) 40 MG tablet Take 1 tablet (40 mg total) by mouth daily. 90 tablet 3  . potassium chloride 20 MEQ/15ML (10%) SOLN Take 7.5 mLs (10 mEq total) by mouth daily. 225 mL 5  . Vitamin D, Ergocalciferol, (DRISDOL) 1.25 MG (50000 UT) CAPS capsule Take 1 capsule (50,000 Units total) by mouth every 7 (seven) days. (Patient taking differently: Take 50,000 Units by mouth every Friday. ) 12 capsule 0   No current facility-administered medications on file prior to visit.     Allergies  Allergen Reactions  . Cefepime Hives    Had allergic reaction to one of these 3 agents, unclear which one  . Metoprolol Hives    Had allergic reaction to one of these 3 agents, unclear which one  *Per RN, highly likely this is the cause of allergic rxn  . Myrbetriq [Mirabegron] Hives    Had allergic reaction to one of these 3 agents, unclear which one    Family History  Problem Relation Age of Onset  . Hypertension Mother   . Cancer Father        lung ca  . Diabetes Maternal Aunt        x 2  . Colon cancer Neg Hx   . Esophageal cancer Neg Hx   . Pancreatic cancer Neg Hx   . Prostate cancer Neg Hx   . Kidney disease Neg Hx   . Liver disease Neg Hx   . Lung cancer Neg Hx   . Rectal cancer Neg Hx   . Stomach cancer Neg Hx     BP (!) 144/64 (BP Location: Right Arm, Patient  Position: Sitting, Cuff Size: Large)   Pulse 78   Ht 5\' 8"  (1.727 m)   Wt 243 lb 3.2 oz (110.3 kg)   SpO2 97%   BMI 36.98 kg/m    Review of Systems He denies hypoglycemia.      Objective:   Physical Exam VITAL SIGNS:  See vs page GENERAL: no distress Pulses: dorsalis pedis intact bilat.   MSK: no deformity of the feet CV: trace bilat leg edema, and bilat vv's.  Skin:  no ulcer on the feet.  normal color and temp on the feet. Neuro: sensation is intact to touch on the feet Ext: there is bilateral onychomycosis of the toenails  Lab Results  Component Value Date   HGBA1C 8.9 (H) 06/08/2019   Lab Results  Component Value Date   CREATININE 1.05 07/11/2019   BUN 13 07/11/2019   NA 136 07/11/2019   K 4.0 07/11/2019   CL 97 (L) 07/11/2019   CO2 26 07/11/2019       Assessment & Plan:  Insulin-requiring type 2 DM, with PAD: uncertain glycemic control HTN: is noted today: recheck next time.   Patient Instructions  A different type of diabetes blood test is requested for you today.  We'll let you know about the result.  please change Lantus to Toujeo (concentrated), when you run out of Lantus.   check your blood sugar twice a day.  vary the time of day when you check, between before the 3 meals, and at bedtime.  also check if you have symptoms of your blood  sugar being too high or too low.  please keep a record of the readings and bring it to your next appointment here (or you can bring the meter itself).  You can write it on any piece of paper.  please call us sooner if your blood sugar goes below 70, or if you have a lot of readings over 200.   Please come back for a follow-up appointment as scheduled.

## 2019-07-14 NOTE — Patient Instructions (Addendum)
A different type of diabetes blood test is requested for you today.  We'll let you know about the result.  please change Lantus to Toujeo (concentrated), when you run out of Lantus.   check your blood sugar twice a day.  vary the time of day when you check, between before the 3 meals, and at bedtime.  also check if you have symptoms of your blood sugar being too high or too low.  please keep a record of the readings and bring it to your next appointment here (or you can bring the meter itself).  You can write it on any piece of paper.  please call us sooner if your blood sugar goes below 70, or if you have a lot of readings over 200.   Please come back for a follow-up appointment as scheduled.

## 2019-07-16 ENCOUNTER — Other Ambulatory Visit: Payer: Self-pay | Admitting: Internal Medicine

## 2019-07-18 ENCOUNTER — Telehealth: Payer: Self-pay | Admitting: Cardiovascular Disease

## 2019-07-18 NOTE — Telephone Encounter (Signed)
Returned the call to the patient. He stated someone called his wife's phone to give him the results of the monitor. It does not look like they have been read yet. Message has been routed to the ordering provider.

## 2019-07-18 NOTE — Telephone Encounter (Signed)
Patient returning call for monitor results. 

## 2019-07-19 LAB — FRUCTOSAMINE: Fructosamine: 313 umol/L — ABNORMAL HIGH (ref 205–285)

## 2019-07-19 NOTE — Telephone Encounter (Signed)
Please inform patient monitor is not uploaded for review at this time.  Uncertain who called his wife.

## 2019-07-19 NOTE — Telephone Encounter (Signed)
Call to patient to make him aware that the monitor results are not back at this time and left detailed message that we will call back when they are complete.  Advised pt to call for any further questions or concerns.

## 2019-07-20 ENCOUNTER — Telehealth: Payer: Self-pay

## 2019-07-20 NOTE — Telephone Encounter (Signed)
-----   Message from [name], MD sent at 07/20/2019 12:20 PM EST ----- please contact patient: This is like an A1c of approx 7.8%, which is better than the A1c itself.  Please continue the same insulin When you change to the concentrated (Toujeo), please continue the same amount.

## 2019-07-20 NOTE — Telephone Encounter (Signed)
Lab results reviewed by Dr. Ellison. Letter has been mailed. For future reference, letter can be found in Epic. 

## 2019-07-21 DIAGNOSIS — I471 Supraventricular tachycardia: Secondary | ICD-10-CM | POA: Diagnosis not present

## 2019-07-22 ENCOUNTER — Telehealth: Payer: Self-pay | Admitting: Cardiovascular Disease

## 2019-07-22 NOTE — Telephone Encounter (Signed)
Patient was previously placed on metoprolol along with several other medications at the same time and subsequently broke out in a rash.  There are some question as to if metoprolol led to this rash or not.  Nonetheless, this has subsequently been discontinued.  Based on the results of his Zio patch, and in the setting of lower extremity swelling likely exacerbated by calcium channel blocker usage, he may need to be rechallenged with beta-blocker.  This can be discussed with the patient when he is seen in follow-up next month.

## 2019-07-22 NOTE — Telephone Encounter (Signed)
Pt c/o swelling: STAT is pt has developed SOB within 24 hours  1) How much weight have you gained and in what time span? No some weight loss   2) If swelling, where is the swelling located? Feet  3) Are you currently taking a fluid pill? Yes   4) Are you currently SOB? Not terrible a little  5) Do you have a log of your daily weights (if so, list)? Previous 250 now 240   6) Have you gained 3 pounds in a day or 5 pounds in a week? No   7) Have you traveled recently? No

## 2019-07-22 NOTE — Telephone Encounter (Signed)
DPR on file. Spoke with the patient's wife. Patients wife sts that the patient has been doing better. He is working on weight loss and is slowly regaining his energy. Pt denies sob, cp, palpitations. Pt wife sts that the patient does have bilateral minimal LE swelling. The pt does take Amlodipine 10mg  qd. Advised her to have the patient elevate his legs as much as possible. The patient does have compression stockings at home, adv pt wife that he could try wearing them daily. Advised the patient's wife to continue to monitor the patient symptoms and to contact the office next week if no improvement.  The patient's wife also inquired on the patient's zio monitor results. Adv the pt wife that the results are awaiting signature by the ordering provider. We will contact them when results as soon as they become available.  Pt wife verbalized understanding and voiced appreciation for the call back.

## 2019-07-23 ENCOUNTER — Other Ambulatory Visit: Payer: Self-pay | Admitting: Internal Medicine

## 2019-07-26 ENCOUNTER — Telehealth: Payer: Self-pay | Admitting: *Deleted

## 2019-07-26 NOTE — Telephone Encounter (Signed)
Copied from [place] [id number]. Topic: General - Inquiry >> Jul 26, 2019 11:31 AM Percell Belt A wrote: Reason for CRM: pt wife called in and wanted to know if pt is suppose to keep taking the Vitamin D, Ergocalciferol, (DRISDOL) 1.25 MG or was he to stop?  Please advise  Best number  [phone]

## 2019-07-26 NOTE — Telephone Encounter (Signed)
OK  to change to OTC Vitamin D3 at 2000 units per day, indefinitely.

## 2019-07-26 NOTE — Telephone Encounter (Signed)
Patient spouse calling  Wants to check on status of monitor results  Please call mobile number first then home number

## 2019-07-26 NOTE — Telephone Encounter (Signed)
Monitor results are available in Epic. Msg to Dr. Fletcher Anon to review and sign zio monitor results.

## 2019-07-27 ENCOUNTER — Telehealth: Payer: Self-pay

## 2019-07-27 MED ORDER — DILTIAZEM HCL ER COATED BEADS 180 MG PO CP24: 180.0000 mg | ORAL_CAPSULE | Freq: Every day | ORAL | 3 refills | Status: DC

## 2019-07-27 NOTE — Telephone Encounter (Signed)
Pt's wife has been informed.  

## 2019-07-27 NOTE — Telephone Encounter (Signed)
Call to patient to discuss monitor results and POC>   Pt verbalized understanding and will stop amlodipine. Rx for cardizem sent to pt preferred pharmacy on file.   Confirmed upcoming appt in Jan.   Advised pt to call for any further questions or concerns.

## 2019-07-27 NOTE — Telephone Encounter (Signed)
Call completed in a separate encounter.   See call for more details.

## 2019-07-27 NOTE — Telephone Encounter (Signed)
-----   Message from Rise Mu, [place] sent at 07/27/2019  7:13 AM EST ----- Cardiac monitor showed a predominant rhythm of sinus with 112 episodes of SVT with the longest episode lasting 21 seconds. PAF was noted with an overall burden of < 1%. The longest episode lasted 3 minutes and 36 seconds.   -There was some question as to if metoprolol caused a rash in him, with the inpatient team feeling this was unlikely, nonetheless, this was ultimately stopped. Please have patient stop amlodipine and start Cardizem CD 180mg  daily.  -I will route to his primary cardiologist for recommendations on Afib and anticoagulation given his burden was < 1%. -Keep follow up next month to reassess symptoms and tolerance.

## 2019-08-01 ENCOUNTER — Telehealth (HOSPITAL_COMMUNITY): Payer: Self-pay | Admitting: Emergency Medicine

## 2019-08-01 NOTE — Telephone Encounter (Signed)
Reaching out to patient to offer assistance regarding upcoming cardiac imaging study; pt verbalizes understanding of appt date/time, parking situation and where to check in, pre-test NPO status and medications ordered, and verified current allergies; name and call back number provided for further questions should they arise Paul Bond RN [place] [facility] [phone] office [phone] cell  Spoke to wife who took notes for husband who was out of the house [name]  States he takes daily meds at PM, will take ivabradine 2 hr prior to scan

## 2019-08-02 ENCOUNTER — Ambulatory Visit (HOSPITAL_COMMUNITY)
Admission: RE | Admit: 2019-08-02 | Discharge: 2019-08-02 | Disposition: A | Discharge status: Home / Self Care | Payer: Medicare Other | Source: Ambulatory Visit | Attending: Physician Assistant | Admitting: Physician Assistant

## 2019-08-02 ENCOUNTER — Other Ambulatory Visit: Payer: Self-pay

## 2019-08-02 DIAGNOSIS — I5032 Chronic diastolic (congestive) heart failure: Secondary | ICD-10-CM

## 2019-08-02 MED ORDER — NITROGLYCERIN 0.4 MG SL SUBL
0.8000 mg | SUBLINGUAL_TABLET | Freq: Once | SUBLINGUAL | Status: AC
  Administered 2019-08-02: 0.8 mg via SUBLINGUAL

## 2019-08-02 MED ORDER — IOHEXOL 350 MG/ML SOLN
80.0000 mL | Freq: Once | INTRAVENOUS | Status: AC | PRN
  Administered 2019-08-02: 80 mL via INTRAVENOUS

## 2019-08-02 MED ORDER — NITROGLYCERIN 0.4 MG SL SUBL
SUBLINGUAL_TABLET | SUBLINGUAL | Status: AC
  Filled 2019-08-02: qty 2

## 2019-08-03 DIAGNOSIS — I5032 Chronic diastolic (congestive) heart failure: Secondary | ICD-10-CM | POA: Diagnosis not present

## 2019-08-08 ENCOUNTER — Telehealth: Payer: Self-pay

## 2019-08-08 NOTE — Telephone Encounter (Signed)
Call to patient to discuss results and POC from Cardiac CT.   No further orders at this time. Encouraged pt to keep heart healthy diet and low sodium and sugar.   Confirmed f/u 1/12.  No further questions at this time.

## 2019-08-08 NOTE — Telephone Encounter (Signed)
-----   Message from Rise Mu, [place] sent at 08/04/2019 12:01 PM EST ----- Please inform patient coronary CTA showed overall nonobstructive disease with with slightly more narrowing along the distal aspects of 2 small branches.  This was discussed with his primary cardiologist and felt to be somewhat expected.  No invasive intervention is needed at this time.  Recommend aggressive medical management including optimal lipid and blood sugar control.  He should follow-up as planned next month with repeat liver function at that time.  If liver function has normalized recommend escalation of atorvastatin in an effort to achieve goal LDL less than 70.  Patient should follow up with his PCP in an effort to improve his hemoglobin A1c.  Based on how he is feeling when he follows up next month we can consider addition of isosorbide.

## 2019-08-23 ENCOUNTER — Encounter: Payer: Self-pay | Admitting: Cardiovascular Disease

## 2019-08-23 ENCOUNTER — Other Ambulatory Visit: Payer: Self-pay

## 2019-08-23 ENCOUNTER — Ambulatory Visit (INDEPENDENT_AMBULATORY_CARE_PROVIDER_SITE_OTHER): Payer: Medicare Other | Admitting: Cardiovascular Disease

## 2019-08-23 VITALS — BP 148/60 | HR 68 | Ht 68.0 in | Wt 244.2 lb

## 2019-08-23 DIAGNOSIS — I739 Peripheral vascular disease, unspecified: Secondary | ICD-10-CM | POA: Diagnosis not present

## 2019-08-23 DIAGNOSIS — I10 Essential (primary) hypertension: Secondary | ICD-10-CM | POA: Diagnosis not present

## 2019-08-23 DIAGNOSIS — E785 Hyperlipidemia, unspecified: Secondary | ICD-10-CM | POA: Diagnosis not present

## 2019-08-23 DIAGNOSIS — I471 Supraventricular tachycardia: Secondary | ICD-10-CM

## 2019-08-23 DIAGNOSIS — I2510 Atherosclerotic heart disease of native coronary artery without angina pectoris: Secondary | ICD-10-CM

## 2019-08-23 NOTE — Patient Instructions (Signed)
Medication Instructions:  Your physician recommends that you continue on your current medications as directed. Please refer to the Current Medication list given to you today.  *If you need a refill on your cardiac medications before your next appointment, please call your pharmacy*  Lab Work: None ordered If you have labs (blood work) drawn today and your tests are completely normal, you will receive your results only by: . MyChart Message (if you have MyChart) OR . A paper copy in the mail If you have any lab test that is abnormal or we need to change your treatment, we will call you to review the results.  Testing/Procedures: None ordered  Follow-Up: At [facility], you and your health needs are our priority.  As part of our continuing mission to provide you with exceptional heart care, we have created designated Provider Care Teams.  These Care Teams include your primary Cardiologist (physician) and Advanced Practice Providers (APPs -  Physician Assistants and Nurse Practitioners) who all work together to provide you with the care you need, when you need it.  Your next appointment:   6 month(s)  The format for your next appointment:   In Person  Provider:    You may see Muhammad Arida, MD or one of the following Advanced Practice Providers on your designated Care Team:    Christopher Berge, NP  Ryan Dunn, PA-C  Jacquelyn Visser, PA-C   Other Instructions N/A  

## 2019-08-23 NOTE — Progress Notes (Signed)
Cardiology Office Note   Date:  08/23/2019   ID:  Tacorey, Cotrone Apr 28, 1956, MRN [id number]  PCP:  Biagio Borg, MD  Cardiologist:   Kathlyn Sacramento, MD   Chief Complaint  Patient presents with  . office visit    Pt 2 month f/u. Meds verbally reviewed w/ pt.      History of Present Illness:  [name] is a 64 y.o. male who presents for a follow up visit regarding PAD. He has known history of insulin-requiring DM (dx'ed 1992),  Hyperlipidemia, hypertension and tobacco use. He is followed for right calf claudication due to occluded right SFA. Noninvasive evaluation in August 2015 showed an ABI of 0.44 on the right and normal on the left. Duplex showed long occlusion of right SFA from mid to distal segment with reconstitution in popliteal artery. There was significant left SFA stenosis.   He had atypical chest pain in late 2015. Nuclear stress test showed a small inferolateral infarct with no significant ischemia. He was treated medically given that his chest pain resolved.  He quit smoking in December 2017.   He was hospitalized in October 2020 with sepsis secondary to UTI and pyelonephritis as well as hypertensive urgency.  His troponin was mildly elevated and peaked at 741.  CTA of the chest was negative for pulmonary embolism but he was noted to have coronary artery calcifications.  Echocardiogram during admission showed an EF of 60 to 65%, moderate LVH and no significant valvular abnormalities.  During his hospitalization, he was noted to have short runs of SVT in the setting of electrolyte abnormalities. The patient subsequently underwent outpatient cardiac CTA which showed coronary calcium score 412.  There was mild left main stenosis and mild first diagonal stenosis.  He had palpitations and thus was evaluated with a 14-day monitor which showed normal sinus rhythm with an average heart rate of 76 bpm.  He was noted to have frequent short runs of SVT as well as paroxysmal  atrial fibrillation with a burden of less than 1%.  The longest episode lasted 3 minutes.  The patient was switched from amlodipine to diltiazem.  He has been doing well overall but has been stressed about illness of his wife after she fractured her arm.  He reports improvement in palpitations and shortness of breath.  Past Medical History:  Diagnosis Date  . Allergic rhinitis   . At risk for sleep apnea    STOP-BANG= 5   SENT TO PCP 03-14-2014  . Cataract    surgically removed bilateral  . Condyloma acuminatum of penis   . Diabetic neuropathy ([place])   . GERD (gastroesophageal reflux disease)   . History of bladder cancer    s/p  turbt  2013/   transitional cell carcinoma--   . History of condyloma acuminatum    PERINEAL AREA  W/ RECURRENCY  . History of gout   . Hyperlipidemia   . Hypertension   . Lower urinary tract symptoms (LUTS)   . Productive cough   . PVD (peripheral vascular disease) with claudication (HCC)    bilateral SFA disease-- right > left  and left tibial artery disease--  per duplex  . Smokers' cough ([place])   . Type 2 diabetes mellitus with insulin therapy ([place])    monitor by  dr ellsion  . Wears dentures    upper    Past Surgical History:  Procedure Laterality Date  . AXILLARY HIDRADENITIS EXCISION  1997  . CARDIOVASCULAR  STRESS TEST  07-24-2014  dr Kathlyn Sacramento   Low risk lexiscan nuclear study with apical thinning and small inferolateral wall infarct at mid & basal level , no ischemia/  normal LVF and wall motion , ef 59%  . CATARACT EXTRACTION Left   . CATARACT EXTRACTION W/ INTRAOCULAR LENS IMPLANT Right   . CO2 LASER APPLICATION N/A 0000000   Procedure: CO2 LASER APPLICATION,PENIS, GROIN, ANUS;  Surgeon: [name], MD;  Location: [place];  Service: General;  Laterality: N/A;  . CO2 LASER APPLICATION N/A 123XX123   Procedure: CO2 LASER APPLICATION;  Surgeon: Kathie Rhodes, MD;  Location: [facility];  Service:  Urology;  Laterality: N/A;  . CONDYLOMA EXCISION/FULGURATION N/A 05/21/2015   Procedure: CONDYLOMA REMOVAL;  Surgeon: Kathie Rhodes, MD;  Location[facility];  Service: Urology;  Laterality: N/A;  . HEMORRHOID SURGERY  10/24/2014   Procedure: HEMORRHOIDECTOMY;  Surgeon: Michael Boston, MD;  Location: [facility];  Service: General;;  . INCISION AND DRAINAGE ABSCESS Left 10/24/2014   Procedure: INCISION AND DRAINAGE ABSCESS;  Surgeon: Michael Boston, MD;  Location: [place];  Service: General;  Laterality: Left;  . INGUINAL HIDRADENITIS EXCISION  1998, 1999  . LASER ABLATION CONDOLAMATA N/A 03/20/2014   Procedure: EXAM UNDER ANESTHESIA, REMOVAL/ABLATION OF CONDYLOMATA PENIS,GROINS, ANUS, ANAL CANAL;  Surgeon: [name], MD;  Location: [place];  Service: General;  Laterality: N/A;  groin and anus  . LASER ABLATION CONDOLAMATA N/A 10/24/2014   Procedure: LASER ABLATION CONDOLAMATA;  Surgeon: Michael Boston, MD;  Location: [facility];  Service: General;  Laterality: N/A;  . LASER ABLATION OF PENILE AND PERIANAL WARTS  07-29-2007  Dr. Johney Maine  . LEFT SHOULDER SURGERY  2003  . MASS EXCISION N/A 10/24/2014   Procedure: EXCISION OF PERINEAL MASS/SINUS;  Surgeon: Michael Boston, MD;  Location: [place];  Service: General;  Laterality: N/A;  . MOHS SURGERY     back  . MOHS SURGERY  2017   face  . MULTIPLE TOOTH EXTRACTIONS    . PERINEAL HIDRADENITIS EXCISION  1998, 1999  . TRANSURETHRAL RESECTION OF BLADDER TUMOR  05/21/2012   Procedure: TRANSURETHRAL RESECTION OF BLADDER TUMOR (TURBT);  Surgeon: Claybon Jabs, MD;  Location: [facility];  Service: Urology;  Laterality: N/A;        Current Outpatient Medications  Medication Sig Dispense Refill  . acetaminophen (TYLENOL) 500 MG tablet Take 500 mg by mouth every 6 (six) hours as needed.    Marland Kitchen aspirin EC 81 MG tablet Take 1 tablet (81 mg  total) by mouth daily.    Marland Kitchen atorvastatin (LIPITOR) 20 MG tablet Take 1 tablet (20 mg total) by mouth daily. 90 tablet 3  . diltiazem (CARDIZEM CD) 180 MG 24 hr capsule Take 1 capsule (180 mg total) by mouth daily. 90 capsule 3  . hydrochlorothiazide (HYDRODIURIL) 25 MG tablet Take 1 tablet (25 mg total) by mouth daily. 90 tablet 3  . Insulin Glargine, 2 Unit Dial, (TOUJEO MAX SOLOSTAR) 300 UNIT/ML SOPN Inject 175 Units into the skin every morning. And pen needles 1/day 6 pen 11  . ivabradine (CORLANOR) 5 MG TABS tablet Take 1 tablet (5 mg) by mouth once 2 hours prior to procedure. 1 tablet 0  . loratadine (CLARITIN) 10 MG tablet Take 10 mg by mouth daily.    Marland Kitchen losartan (COZAAR) 100 MG tablet Take 1 tablet (100 mg total) by mouth  daily. 90 tablet 3  . pantoprazole (PROTONIX) 40 MG tablet Take 1 tablet (40 mg total) by mouth daily. 90 tablet 3  . potassium chloride 20 MEQ/15ML (10%) SOLN Take 7.5 mLs (10 mEq total) by mouth daily. 225 mL 5  . Vitamin D, Ergocalciferol, (DRISDOL) 1.25 MG (50000 UT) CAPS capsule Take 1 capsule (50,000 Units total) by mouth every 7 (seven) days. (Patient taking differently: Take 50,000 Units by mouth every Friday. ) 12 capsule 0   No current facility-administered medications for this visit.    Allergies:   Cefepime, Metoprolol, and Myrbetriq [mirabegron]    Social History:  The patient  reports that he quit smoking about 3 years ago. His smoking use included cigars and cigarettes. He has a 41.00 pack-year smoking history. He has never used smokeless tobacco. He reports that he does not drink alcohol or use drugs.   Family History:  The patient's family history includes Cancer in his father; Diabetes in his maternal aunt; Hypertension in his mother.    ROS:  Please see the history of present illness.   Otherwise, review of systems are positive for none.   All other systems are reviewed and negative.    PHYSICAL EXAM: VS:  There were no vitals taken for this  visit. , BMI There is no height or weight on file to calculate BMI. GEN: Well nourished, well developed, in no acute distress  HEENT: normal  Neck: no JVD, carotid bruits, or masses Cardiac: RRR; no murmurs, rubs, or gallops, trace edema  Respiratory:  clear to auscultation bilaterally, normal work of breathing GI: soft, nontender, nondistended, + BS MS: no deformity or atrophy  Skin: warm and dry, no rash Neuro:  Strength and sensation are intact Psych: euthymic mood, full affect   EKG:  EKG is ordered today. EKG showed normal sinus rhythm with PACs and left axis deviation.   Recent Labs: 04/21/2019: TSH 1.56 06/04/2019: B Natriuretic Peptide 112.6 06/05/2019: Magnesium 2.4 06/09/2019: Hemoglobin 13.4; Platelets 259 06/16/2019: ALT 59 07/11/2019: BUN 13; Creatinine, Ser 1.05; Potassium 4.0; Sodium 136    Lipid Panel    Component Value Date/Time   CHOL 151 06/16/2019 1144   TRIG 205 (H) 06/16/2019 1144   HDL 28 (L) 06/16/2019 1144   CHOLHDL 5.4 (H) 06/16/2019 1144   CHOLHDL 4 04/21/2019 1041   VLDL 24.2 04/21/2019 1041   LDLCALC 88 06/16/2019 1144   LDLDIRECT 67.0 06/15/2018 1652      Wt Readings from Last 3 Encounters:  07/14/19 243 lb 3.2 oz (110.3 kg)  06/20/19 244 lb (110.7 kg)  06/16/19 246 lb (111.6 kg)       ASSESSMENT AND PLAN:  1.  Peripheral arterial disease: Known occluded right SFA .he has mild to moderate non-lifestyle limiting right calf claudication.  Continue medical therapy.  2. Coronary artery disease involving native coronary arteries without angina: Cardiac CTA showed elevated calcium score as outlined above with mild left main and diagonal disease.  Recommend aggressive medical therapy.  3.  Paroxysmal supraventricular tachycardia: A. fib burden was not significant enough to justify anticoagulation.  Symptoms improved after switching amlodipine to diltiazem.  4. Essential hypertension: Blood pressure is reasonably controlled on current  medications.  5.  Hyperlipidemia: Continue treatment with atorvastatin.  Most recent LDL was 88.  I recommend a target LDL of less than 70.  I suggested increasing atorvastatin to 40 mg daily or switching to rosuvastatin 40 mg daily but the patient is hesitant to make any changes as  he is worried about change in cost of medications.  We will consider this upon follow-up.    Disposition:   FU with me in 6 months.  Signed,  Kathlyn Sacramento, MD  08/23/2019 3:01 PM    [place]

## 2019-08-29 DIAGNOSIS — L732 Hidradenitis suppurativa: Secondary | ICD-10-CM | POA: Diagnosis not present

## 2019-09-08 NOTE — Telephone Encounter (Signed)
Cardiac CTA showed coronary calcium score 412.  There was mild left main stenosis and mild first diagonal stenosis. 14-day monitor showed normal sinus rhythm with an average heart rate of 76 bpm.  He was noted to have frequent short runs of SVT as well as paroxysmal atrial fibrillation with a burden of less than 1%.  The longest episode lasted 3 minutes.    Patient saw Dr. Fletcher Anon on 08/23/19. Recommended medical therapy for CAD and no anticoagulation for PSVT/PAF.   Given past medical history and time since last visit, based on ACC/AHA guidelines, [name] would be at acceptable risk for the planned procedure without further cardiovascular testing. He would be risk for arrhythmias during procedure/sugery.   I will route this recommendation to the requesting party via Epic fax function and remove from pre-op pool.  Please call with questions.  [place], [place] 09/08/2019, 2:07 PM

## 2019-09-08 NOTE — Telephone Encounter (Signed)
[facility], Dr. Alwyn Pea' office, is calling in to check on the status of this clearance

## 2019-09-20 DIAGNOSIS — L732 Hidradenitis suppurativa: Secondary | ICD-10-CM | POA: Diagnosis not present

## 2019-09-21 ENCOUNTER — Telehealth: Payer: Self-pay

## 2019-09-21 NOTE — Telephone Encounter (Signed)
Please refill as per office routine med refill policy (all routine meds refilled for 3 mo or monthly per pt preference up to one year from last visit, then month to month grace period for 3 mo, then further med refills will have to be denied)  

## 2019-09-21 NOTE — Telephone Encounter (Signed)
New message    Pt c/o medication issue:  1. Name of Medication: atorvastatin (LIPITOR) 20 MG tablet  2. How are you currently taking this medication (dosage and times per day)? One time a day  3. Are you having a reaction (difficulty breathing--STAT)? no  4. What is your medication issue? Dr. Fletcher Anon wants medication dosage to be change to 40 MG   5. Send to [facility]

## 2019-09-22 ENCOUNTER — Other Ambulatory Visit: Payer: Self-pay

## 2019-09-22 MED ORDER — ATORVASTATIN CALCIUM 20 MG PO TABS: 20.0000 mg | ORAL_TABLET | Freq: Every day | ORAL | 3 refills | Status: DC

## 2019-09-22 NOTE — Telephone Encounter (Signed)
Refill sent in today

## 2019-09-22 NOTE — Telephone Encounter (Signed)
   Spouse calling to discuss changing Lipitor dosage

## 2019-09-22 NOTE — Telephone Encounter (Signed)
[facility] for the 40 mg per routine office refill policy, thanks

## 2019-09-23 ENCOUNTER — Other Ambulatory Visit: Payer: Self-pay

## 2019-09-23 MED ORDER — ATORVASTATIN CALCIUM 40 MG PO TABS: ORAL_TABLET | ORAL | 3 refills | Status: DC

## 2019-09-23 NOTE — Telephone Encounter (Signed)
Spoke with wife Marcie Bal today and info given that new script had been sent in.

## 2019-09-23 NOTE — Telephone Encounter (Signed)
40 mg sent in today for patient.

## 2019-10-19 ENCOUNTER — Other Ambulatory Visit: Payer: Self-pay

## 2019-10-20 ENCOUNTER — Ambulatory Visit (INDEPENDENT_AMBULATORY_CARE_PROVIDER_SITE_OTHER): Payer: Medicare Other | Admitting: Internal Medicine

## 2019-10-20 ENCOUNTER — Encounter: Payer: Self-pay | Admitting: Internal Medicine

## 2019-10-20 VITALS — BP 160/72 | HR 70 | Temp 98.0°F | Ht 68.0 in | Wt 242.0 lb

## 2019-10-20 DIAGNOSIS — E559 Vitamin D deficiency, unspecified: Secondary | ICD-10-CM | POA: Insufficient documentation

## 2019-10-20 DIAGNOSIS — Z794 Long term (current) use of insulin: Secondary | ICD-10-CM

## 2019-10-20 DIAGNOSIS — E1151 Type 2 diabetes mellitus with diabetic peripheral angiopathy without gangrene: Secondary | ICD-10-CM | POA: Diagnosis not present

## 2019-10-20 DIAGNOSIS — E611 Iron deficiency: Secondary | ICD-10-CM

## 2019-10-20 DIAGNOSIS — E538 Deficiency of other specified B group vitamins: Secondary | ICD-10-CM

## 2019-10-20 DIAGNOSIS — Z0000 Encounter for general adult medical examination without abnormal findings: Secondary | ICD-10-CM

## 2019-10-20 LAB — CBC WITH DIFFERENTIAL/PLATELET
Basophils Absolute: 0.1 10*3/uL (ref 0.0–0.1)
Basophils Relative: 0.7 % (ref 0.0–3.0)
Eosinophils Absolute: 0.5 10*3/uL (ref 0.0–0.7)
Eosinophils Relative: 4.5 % (ref 0.0–5.0)
HCT: 41.5 % (ref 39.0–52.0)
Hemoglobin: 14.3 g/dL (ref 13.0–17.0)
Lymphocytes Relative: 18.6 % (ref 12.0–46.0)
Lymphs Abs: 2.2 10*3/uL (ref 0.7–4.0)
MCHC: 34.5 g/dL (ref 30.0–36.0)
MCV: 93.2 fl (ref 78.0–100.0)
Monocytes Absolute: 0.7 10*3/uL (ref 0.1–1.0)
Monocytes Relative: 6 % (ref 3.0–12.0)
Neutro Abs: 8.2 10*3/uL — ABNORMAL HIGH (ref 1.4–7.7)
Neutrophils Relative %: 70.2 % (ref 43.0–77.0)
Platelets: 260 10*3/uL (ref 150.0–400.0)
RBC: 4.45 Mil/uL (ref 4.22–5.81)
RDW: 13.5 % (ref 11.5–15.5)
WBC: 11.7 10*3/uL — ABNORMAL HIGH (ref 4.0–10.5)

## 2019-10-20 LAB — PSA: PSA: 3.73 ng/mL (ref 0.10–4.00)

## 2019-10-20 LAB — URINALYSIS, ROUTINE W REFLEX MICROSCOPIC
Bilirubin Urine: NEGATIVE
Hgb urine dipstick: NEGATIVE
Ketones, ur: NEGATIVE
Leukocytes,Ua: NEGATIVE
Nitrite: NEGATIVE
RBC / HPF: NONE SEEN (ref 0–?)
Specific Gravity, Urine: 1.02 (ref 1.000–1.030)
Urine Glucose: >=1000 — AB
Urobilinogen, UA: 0.2 (ref 0.0–1.0)
WBC, UA: NONE SEEN (ref 0–?)
pH: 5 (ref 5.0–8.0)

## 2019-10-20 LAB — VITAMIN D 25 HYDROXY (VIT D DEFICIENCY, FRACTURES): VITD: 26.22 ng/mL — ABNORMAL LOW (ref 30.00–100.00)

## 2019-10-20 LAB — LIPID PANEL
Cholesterol: 96 mg/dL (ref 0–200)
HDL: 30.2 mg/dL — ABNORMAL LOW (ref >39.00)
LDL Cholesterol: 35 mg/dL (ref 0–99)
NonHDL: 65.54
Total CHOL/HDL Ratio: 3
Triglycerides: 151 mg/dL — ABNORMAL HIGH (ref 0.0–149.0)
VLDL: 30.2 mg/dL (ref 0.0–40.0)

## 2019-10-20 LAB — MICROALBUMIN / CREATININE URINE RATIO
Creatinine,U: 52.9 mg/dL
Microalb Creat Ratio: 45.5 mg/g — ABNORMAL HIGH (ref 0.0–30.0)
Microalb, Ur: 24 mg/dL — ABNORMAL HIGH (ref 0.0–1.9)

## 2019-10-20 LAB — BASIC METABOLIC PANEL
BUN: 16 mg/dL (ref 6–23)
CO2: 32 mEq/L (ref 19–32)
Calcium: 9.3 mg/dL (ref 8.4–10.5)
Chloride: 99 mEq/L (ref 96–112)
Creatinine, Ser: 1.03 mg/dL (ref 0.40–1.50)
GFR: 72.93 mL/min (ref >60.00)
Glucose, Bld: 316 mg/dL — ABNORMAL HIGH (ref 70–99)
Potassium: 4 mEq/L (ref 3.5–5.1)
Sodium: 136 mEq/L (ref 135–145)

## 2019-10-20 LAB — HEPATIC FUNCTION PANEL
ALT: 37 U/L (ref 0–53)
AST: 28 U/L (ref 0–37)
Albumin: 4 g/dL (ref 3.5–5.2)
Alkaline Phosphatase: 77 U/L (ref 39–117)
Bilirubin, Direct: 0.1 mg/dL (ref 0.0–0.3)
Total Bilirubin: 0.7 mg/dL (ref 0.2–1.2)
Total Protein: 7 g/dL (ref 6.0–8.3)

## 2019-10-20 LAB — HEMOGLOBIN A1C: Hgb A1c MFr Bld: 8.9 % — ABNORMAL HIGH (ref 4.6–6.5)

## 2019-10-20 LAB — IBC PANEL
Iron: 68 ug/dL (ref 42–165)
Saturation Ratios: 24 % (ref 20.0–50.0)
Transferrin: 202 mg/dL — ABNORMAL LOW (ref 212.0–360.0)

## 2019-10-20 LAB — VITAMIN B12: Vitamin B-12: 304 pg/mL (ref 211–911)

## 2019-10-20 LAB — TSH: TSH: 1.54 u[IU]/mL (ref 0.35–4.50)

## 2019-10-20 NOTE — Assessment & Plan Note (Signed)

## 2019-10-20 NOTE — Assessment & Plan Note (Signed)
stable overall by history and exam, recent data reviewed with pt, and pt to continue medical treatment as before,  to f/u any worsening symptoms or concerns  

## 2019-10-20 NOTE — Assessment & Plan Note (Signed)
For oral replacement 

## 2019-10-20 NOTE — Patient Instructions (Signed)
Please continue all other medications as before, and refills have been done if requested.  Please have the pharmacy call with any other refills you may need.  Please continue your efforts at being more active, low cholesterol diet, and weight control.  You are otherwise up to date with prevention measures today.  Please keep your appointments with your specialists as you may have planned - Dr Loanne Drilling  Please go to the LAB at the blood drawing area for the tests to be done  You will be contacted by phone if any changes need to be made immediately.  Otherwise, you will receive a letter about your results with an explanation, but please check with MyChart first.  Please remember to sign up for MyChart if you have not done so, as this will be important to you in the future with finding out test results, communicating by private email, and scheduling acute appointments online when needed.  Please make an Appointment to return in 6 months, or sooner if needed

## 2019-10-20 NOTE — Progress Notes (Signed)
Subjective:    Patient ID: [name], male    DOB: Apr 15, 1956, 64 y.o.   MRN: [id number]  HPI  Here for wellness and f/u;  Overall doing ok;  Pt denies Chest pain, worsening SOB, DOE, wheezing, orthopnea, PND, worsening LE edema, palpitations, dizziness or syncope.  Pt denies neurological change such as new headache, facial or extremity weakness.  Pt denies polydipsia, polyuria, or low sugar symptoms. Pt states overall good compliance with treatment and medications, good tolerability, and has been trying to follow appropriate diet.  Pt denies worsening depressive symptoms, suicidal ideation or panic. No fever, night sweats, wt loss, loss of appetite, or other constitutional symptoms.  Pt states good ability with ADL's, has low fall risk, home safety reviewed and adequate, no other significant changes in hearing or vision, and only occasionally active with exercise.  Daughter died lung cancer recently at 48yo.  Plans to see optho soon.  Wife with 2 broken arms. Lost wt with better diet Wt Readings from Last 3 Encounters:  10/20/19 242 lb (109.8 kg)  08/23/19 244 lb 4 oz (110.8 kg)  07/14/19 243 lb 3.2 oz (110.3 kg)  BP at home < 140/90 Past Medical History:  Diagnosis Date  . Allergic rhinitis   . At risk for sleep apnea    STOP-BANG= 5   SENT TO PCP 03-14-2014  . Cataract    surgically removed bilateral  . Condyloma acuminatum of penis   . Diabetic neuropathy ([place])   . GERD (gastroesophageal reflux disease)   . History of bladder cancer    s/p  turbt  2013/   transitional cell carcinoma--   . History of condyloma acuminatum    PERINEAL AREA  W/ RECURRENCY  . History of gout   . Hyperlipidemia   . Hypertension   . Lower urinary tract symptoms (LUTS)   . Productive cough   . PVD (peripheral vascular disease) with claudication (HCC)    bilateral SFA disease-- right > left  and left tibial artery disease--  per duplex  . Smokers' cough ([place])   . Type 2 diabetes mellitus with insulin  therapy ([place])    monitor by  dr ellsion  . Wears dentures    upper   Past Surgical History:  Procedure Laterality Date  . AXILLARY HIDRADENITIS EXCISION  1997  . CARDIOVASCULAR STRESS TEST  07-24-2014  dr Kathlyn [place]   Low risk lexiscan nuclear study with apical thinning and small inferolateral wall infarct at mid & basal level , no ischemia/  normal LVF and wall motion , ef 59%  . CATARACT EXTRACTION Left   . CATARACT EXTRACTION W/ INTRAOCULAR LENS IMPLANT Right   . CO2 LASER APPLICATION N/A 0000000   Procedure: CO2 LASER APPLICATION,PENIS, GROIN, ANUS;  Surgeon: [name], MD;  Location: [place];  Service: General;  Laterality: N/A;  . CO2 LASER APPLICATION N/A 123XX123   Procedure: CO2 LASER APPLICATION;  Surgeon: Kathie Rhodes, MD;  Location[facility];  Service: Urology;  Laterality: N/A;  . CONDYLOMA EXCISION/FULGURATION N/A 05/21/2015   Procedure: CONDYLOMA REMOVAL;  Surgeon: Kathie Rhodes, MD;  Location: [facility];  Service: Urology;  Laterality: N/A;  . HEMORRHOID SURGERY  10/24/2014   Procedure: HEMORRHOIDECTOMY;  Surgeon: Michael Boston, MD;  Location: [facility]  Service: General;;  . INCISION AND DRAINAGE ABSCESS Left 10/24/2014   Procedure: INCISION AND DRAINAGE ABSCESS;  Surgeon: Michael Boston, MD;  Location: [facility];  Service: General;  Laterality: Left;  . INGUINAL HIDRADENITIS EXCISION  1998, 1999  . LASER ABLATION CONDOLAMATA N/A 03/20/2014   Procedure: EXAM UNDER ANESTHESIA, REMOVAL/ABLATION OF CONDYLOMATA PENIS,GROINS, ANUS, ANAL CANAL;  Surgeon: [name], MD;  Location: [place];  Service: General;  Laterality: N/A;  groin and anus  . LASER ABLATION CONDOLAMATA N/A 10/24/2014   Procedure: LASER ABLATION CONDOLAMATA;  Surgeon: Michael Boston, MD;  Location: [facility];  Service: General;  Laterality: N/A;  . LASER ABLATION OF PENILE AND  PERIANAL WARTS  07-29-2007  Dr. Johney Maine  . LEFT SHOULDER SURGERY  2003  . MASS EXCISION N/A 10/24/2014   Procedure: EXCISION OF PERINEAL MASS/SINUS;  Surgeon: Michael Boston, MD;  Location: [place];  Service: General;  Laterality: N/A;  . MOHS SURGERY     back  . MOHS SURGERY  2017   face  . MULTIPLE TOOTH EXTRACTIONS    . PERINEAL HIDRADENITIS EXCISION  1998, 1999  . TRANSURETHRAL RESECTION OF BLADDER TUMOR  05/21/2012   Procedure: TRANSURETHRAL RESECTION OF BLADDER TUMOR (TURBT);  Surgeon: Claybon Jabs, MD;  Location: [facility];  Service: Urology;  Laterality: N/A;       reports that he quit smoking about 3 years ago. His smoking use included cigars and cigarettes. He has a 41.00 pack-year smoking history. He has never used smokeless tobacco. He reports that he does not drink alcohol or use drugs. family history includes Cancer in his father; Diabetes in his maternal aunt; Hypertension in his mother. Allergies  Allergen Reactions  . Cefepime Hives    Had allergic reaction to one of these 3 agents, unclear which one  . Metoprolol Hives    Had allergic reaction to one of these 3 agents, unclear which one  *Per RN, highly likely this is the cause of allergic rxn  . Myrbetriq [Mirabegron] Hives    Had allergic reaction to one of these 3 agents, unclear which one   Current Outpatient Medications on File Prior to Visit  Medication Sig Dispense Refill  . aspirin EC 81 MG tablet Take 1 tablet (81 mg total) by mouth daily.    Marland Kitchen atorvastatin (LIPITOR) 40 MG tablet Take 1 40 mg tablet by mouth daily 90 tablet 3  . diltiazem (CARDIZEM CD) 180 MG 24 hr capsule Take 1 capsule (180 mg total) by mouth daily. 90 capsule 3  . hydrochlorothiazide (HYDRODIURIL) 25 MG tablet Take 1 tablet (25 mg total) by mouth daily. 90 tablet 3  . Insulin Glargine, 2 Unit Dial, (TOUJEO MAX SOLOSTAR) 300 UNIT/ML SOPN Inject 175 Units into the skin every morning. And pen needles 1/day 6  pen 11  . loratadine (CLARITIN) 10 MG tablet Take 10 mg by mouth daily.    Marland Kitchen losartan (COZAAR) 100 MG tablet Take 1 tablet (100 mg total) by mouth daily. 90 tablet 3  . pantoprazole (PROTONIX) 40 MG tablet Take 1 tablet (40 mg total) by mouth daily. 90 tablet 3  . potassium chloride 20 MEQ/15ML (10%) SOLN Take 7.5 mLs (10 mEq total) by mouth daily. 225 mL 5   No current facility-administered medications on file prior to visit.   Review of Systems All otherwise neg per pt     Objective:   Physical Exam BP (!) 160/72   Pulse 70   Temp 98 F (36.7 C)   Ht 5\' 8"  (1.727 m)   Wt 242 lb (109.8 kg)   SpO2 99%  BMI 36.80 kg/m  VS noted,  Constitutional: Pt appears in NAD HENT: Head: NCAT.  Right Ear: External ear normal.  Left Ear: External ear normal.  Eyes: . Pupils are equal, round, and reactive to light. Conjunctivae and EOM are normal Nose: without d/c or deformity Neck: Neck supple. Gross normal ROM Cardiovascular: Normal rate and regular rhythm.   Pulmonary/Chest: Effort normal and breath sounds without rales or wheezing.  Abd:  Soft, NT, ND, + BS, no organomegaly Neurological: Pt is alert. At baseline orientation, motor grossly intact Skin: Skin is warm. No rashes, other new lesions, no LE edema Psychiatric: Pt behavior is normal without agitation  All otherwise neg per pt Lab Results  Component Value Date   WBC 12.5 (H) 06/09/2019   HGB 13.4 06/09/2019   HCT 39.1 06/09/2019   PLT 259 06/09/2019   GLUCOSE 315 (H) 07/11/2019   CHOL 151 06/16/2019   TRIG 205 (H) 06/16/2019   HDL 28 (L) 06/16/2019   LDLDIRECT 67.0 06/15/2018   LDLCALC 88 06/16/2019   ALT 59 (H) 06/16/2019   AST 50 (H) 06/16/2019   NA 136 07/11/2019   K 4.0 07/11/2019   CL 97 (L) 07/11/2019   CREATININE 1.05 07/11/2019   BUN 13 07/11/2019   CO2 26 07/11/2019   TSH 1.56 04/21/2019   PSA 3.27 04/21/2019   INR 1.1 06/04/2019   HGBA1C 8.9 (H) 06/08/2019   MICROALBUR 85.2 (H) 04/21/2019        Assessment & Plan:

## 2019-10-24 ENCOUNTER — Encounter: Payer: Self-pay | Admitting: Endocrinology

## 2019-10-24 ENCOUNTER — Ambulatory Visit (INDEPENDENT_AMBULATORY_CARE_PROVIDER_SITE_OTHER): Payer: Medicare Other | Admitting: Endocrinology

## 2019-10-24 ENCOUNTER — Other Ambulatory Visit: Payer: Self-pay

## 2019-10-24 VITALS — BP 140/80 | HR 72 | Ht 68.0 in | Wt 240.8 lb

## 2019-10-24 DIAGNOSIS — Z794 Long term (current) use of insulin: Secondary | ICD-10-CM | POA: Diagnosis not present

## 2019-10-24 DIAGNOSIS — E1151 Type 2 diabetes mellitus with diabetic peripheral angiopathy without gangrene: Secondary | ICD-10-CM | POA: Diagnosis not present

## 2019-10-24 LAB — POCT GLYCOSYLATED HEMOGLOBIN (HGB A1C): Hemoglobin A1C: 8.8 % — AB (ref 4.0–5.6)

## 2019-10-24 MED ORDER — TOUJEO MAX SOLOSTAR 300 UNIT/ML ~~LOC~~ SOPN: 190.0000 [IU] | PEN_INJECTOR | SUBCUTANEOUS | 11 refills | Status: DC

## 2019-10-24 NOTE — Patient Instructions (Addendum)
Your blood pressure is high today.  Please see your primary care provider soon, to have it rechecked A different type of diabetes blood test is requested for you today.  We'll let you know about the result.  please change the Toujeo to 190 units, all in the morning.  It is best to take all of it in the morning, so the blood sugar does not go low the next morning check your blood sugar twice a day.  vary the time of day when you check, between before the 3 meals, and at bedtime.  also check if you have symptoms of your blood sugar being too high or too low.  please keep a record of the readings and bring it to your next appointment here (or you can bring the meter itself).  You can write it on any piece of paper.  please call us sooner if your blood sugar goes below 70, or if you have a lot of readings over 200.   Please come back for a follow-up appointment in 2 months.

## 2019-10-24 NOTE — Progress Notes (Signed)
Subjective:    Patient ID: [name], male    DOB: March 17, 1956, 64 y.o.   MRN: [id number]  HPI Pt returns for f/u of diabetes mellitus: DM type: Insulin-requiring type 2.   Dx'ed: 0000000 Complications: PAD, renal insuff, and polyneuropathy.   Therapy: insulin since 2009.   DKA: never Severe hypoglycemia: never.   Pancreatitis: never.  Other: he takes a QD insulin regimen, after poor results with more frequent injections.   Interval history: He takes Lantus, 90 units BID.  no cbg record, but states cbg's vary from 78-260.  It is in general higher as the day goes on.   Past Medical History:  Diagnosis Date  . Allergic rhinitis   . At risk for sleep apnea    STOP-BANG= 5   SENT TO PCP 03-14-2014  . Cataract    surgically removed bilateral  . Condyloma acuminatum of penis   . Diabetic neuropathy ([place])   . GERD (gastroesophageal reflux disease)   . History of bladder cancer    s/p  turbt  2013/   transitional cell carcinoma--   . History of condyloma acuminatum    PERINEAL AREA  W/ RECURRENCY  . History of gout   . Hyperlipidemia   . Hypertension   . Lower urinary tract symptoms (LUTS)   . Productive cough   . PVD (peripheral vascular disease) with claudication (HCC)    bilateral SFA disease-- right > left  and left tibial artery disease--  per duplex  . Smokers' cough ([place])   . Type 2 diabetes mellitus with insulin therapy ([place])    monitor by  dr ellsion  . Wears dentures    upper    Past Surgical History:  Procedure Laterality Date  . AXILLARY HIDRADENITIS EXCISION  1997  . CARDIOVASCULAR STRESS TEST  07-24-2014  dr Kathlyn [place]   Low risk lexiscan nuclear study with apical thinning and small inferolateral wall infarct at mid & basal level , no ischemia/  normal LVF and wall motion , ef 59%  . CATARACT EXTRACTION Left   . CATARACT EXTRACTION W/ INTRAOCULAR LENS IMPLANT Right   . CO2 LASER APPLICATION N/A 0000000   Procedure: CO2 LASER APPLICATION,PENIS, GROIN,  ANUS;  Surgeon: [name], MD;  Location: [place];  Service: General;  Laterality: N/A;  . CO2 LASER APPLICATION N/A [id number]   Procedure: CO2 LASER APPLICATION;  Surgeon: Kathie Rhodes, MD;  Location: [facility];  Service: Urology;  Laterality: N/A;  . CONDYLOMA EXCISION/FULGURATION N/A 05/21/2015   Procedure: CONDYLOMA REMOVAL;  Surgeon: Kathie Rhodes, MD;  Location: [facility];  Service: Urology;  Laterality: N/A;  . HEMORRHOID SURGERY  10/24/2014   Procedure: HEMORRHOIDECTOMY;  Surgeon: Michael Boston, MD;  Location: [facility];  Service: General;;  . INCISION AND DRAINAGE ABSCESS Left 10/24/2014   Procedure: INCISION AND DRAINAGE ABSCESS;  Surgeon: Michael Boston, MD;  Location: [place];  Service: General;  Laterality: Left;  . INGUINAL HIDRADENITIS EXCISION  1998, 1999  . LASER ABLATION CONDOLAMATA N/A 03/20/2014   Procedure: EXAM UNDER ANESTHESIA, REMOVAL/ABLATION OF CONDYLOMATA PENIS,GROINS, ANUS, ANAL CANAL;  Surgeon: [name], MD;  Location: [place];  Service: General;  Laterality: N/A;  groin and anus  . LASER ABLATION CONDOLAMATA N/A 10/24/2014   Procedure: LASER ABLATION CONDOLAMATA;  Surgeon: Michael Boston, MD;  Location: [place];  Service: General;  Laterality: N/A;  . LASER ABLATION OF PENILE AND  PERIANAL WARTS  07-29-2007  Dr. Johney Maine  . LEFT SHOULDER SURGERY  2003  . MASS EXCISION N/A 10/24/2014   Procedure: EXCISION OF PERINEAL MASS/SINUS;  Surgeon: Michael Boston, MD;  Location: [place];  Service: General;  Laterality: N/A;  . MOHS SURGERY     back  . MOHS SURGERY  2017   face  . MULTIPLE TOOTH EXTRACTIONS    . PERINEAL HIDRADENITIS EXCISION  1998, 1999  . TRANSURETHRAL RESECTION OF BLADDER TUMOR  05/21/2012   Procedure: TRANSURETHRAL RESECTION OF BLADDER TUMOR (TURBT);  Surgeon: Claybon Jabs, MD;  Location: [facility];  Service: Urology;  Laterality: N/A;       Social History   Socioeconomic History  . Marital status: Married    Spouse name: Not on file  . Number of children: Not on file  . Years of education: Not on file  . Highest education level: Not on file  Occupational History  . Occupation: Disabled  Tobacco Use  . Smoking status: Former Smoker    Packs/day: 1.00    Years: 41.00    Pack years: 41.00    Types: Cigars, Cigarettes    Quit date: 04/11/2016    Years since quitting: 3.5  . Smokeless tobacco: Never Used  Substance and Sexual Activity  . Alcohol use: No    Alcohol/week: 0.0 standard drinks  . Drug use: No  . Sexual activity: Not on file  Other Topics Concern  . Not on file  Social History Narrative  . Not on file   Social Determinants of Health   Financial Resource Strain:   . Difficulty of Paying Living Expenses:   Food Insecurity:   . Worried About Charity fundraiser in the Last Year:   . Arboriculturist in the Last Year:   Transportation Needs:   . Film/video editor (Medical):   Marland Kitchen Lack of Transportation (Non-Medical):   Physical Activity:   . Days of Exercise per Week:   . Minutes of Exercise per Session:   Stress:   . Feeling of Stress :   Social Connections:   . Frequency of Communication with Friends and Family:   . Frequency of Social Gatherings with Friends and Family:   . Attends Religious Services:   . Active Member of Clubs or Organizations:   . Attends Archivist Meetings:   Marland Kitchen Marital Status:   Intimate Partner Violence:   . Fear of Current or Ex-Partner:   . Emotionally Abused:   Marland Kitchen Physically Abused:   . Sexually Abused:     Current Outpatient Medications on File Prior to Visit  Medication Sig Dispense Refill  . aspirin EC 81 MG tablet Take 1 tablet (81 mg total) by mouth daily.    Marland Kitchen atorvastatin (LIPITOR) 40 MG tablet Take 1 40 mg tablet by mouth daily 90 tablet 3  . diltiazem (CARDIZEM CD) 180 MG 24 hr capsule  Take 1 capsule (180 mg total) by mouth daily. 90 capsule 3  . hydrochlorothiazide (HYDRODIURIL) 25 MG tablet Take 1 tablet (25 mg total) by mouth daily. 90 tablet 3  . loratadine (CLARITIN) 10 MG tablet Take 10 mg by mouth daily.    Marland Kitchen losartan (COZAAR) 100 MG tablet Take 1 tablet (100 mg total) by mouth daily. 90 tablet 3  . pantoprazole (PROTONIX) 40 MG tablet Take 1 tablet (40 mg total) by mouth daily. 90 tablet 3  . potassium chloride 20 MEQ/15ML (10%) SOLN Take  7.5 mLs (10 mEq total) by mouth daily. 225 mL 5   No current facility-administered medications on file prior to visit.    Allergies  Allergen Reactions  . Cefepime Hives    Had allergic reaction to one of these 3 agents, unclear which one  . Metoprolol Hives    Had allergic reaction to one of these 3 agents, unclear which one  *Per RN, highly likely this is the cause of allergic rxn  . Myrbetriq [Mirabegron] Hives    Had allergic reaction to one of these 3 agents, unclear which one    Family History  Problem Relation Age of Onset  . Hypertension Mother   . Cancer Father        lung ca  . Diabetes Maternal Aunt        x 2  . Colon cancer Neg Hx   . Esophageal cancer Neg Hx   . Pancreatic cancer Neg Hx   . Prostate cancer Neg Hx   . Kidney disease Neg Hx   . Liver disease Neg Hx   . Lung cancer Neg Hx   . Rectal cancer Neg Hx   . Stomach cancer Neg Hx     BP 140/80   Pulse 72   Ht 5\' 8"  (1.727 m)   Wt 240 lb 12.8 oz (109.2 kg)   SpO2 98%   BMI 36.61 kg/m    Review of Systems He denies hypoglycemia    Objective:   Physical Exam VITAL SIGNS:  See vs page GENERAL: no distress Pulses: dorsalis pedis intact bilat.   MSK: no deformity of the feet CV: 1+ bilat leg edema, and bilat vv's.  Skin:  no ulcer on the feet, but there are bilat heavy calluses.  normal color and temp on the feet. Neuro: sensation is intact to touch on the feet.   Ext: there is bilateral onychomycosis of the toenails.    Lab  Results  Component Value Date   HGBA1C 8.8 (A) 10/24/2019   Lab Results  Component Value Date   CREATININE 1.03 10/20/2019   BUN 16 10/20/2019   NA 136 10/20/2019   K 4.0 10/20/2019   CL 99 10/20/2019   CO2 32 10/20/2019       Assessment & Plan:  Insulin-requiring type 2 DM: he nneeds increased rx.   HTN: is noted today.  Obesity: we discussed.  I advised addition of GLP or SGLT med, but he declines  Patient Instructions  Your blood pressure is high today.  Please see your primary care provider soon, to have it rechecked A different type of diabetes blood test is requested for you today.  We'll let you know about the result.  please change the Toujeo to 190 units, all in the morning.  It is best to take all of it in the morning, so the blood sugar does not go low the next morning check your blood sugar twice a day.  vary the time of day when you check, between before the 3 meals, and at bedtime.  also check if you have symptoms of your blood sugar being too high or too low.  please keep a record of the readings and bring it to your next appointment here (or you can bring the meter itself).  You can write it on any piece of paper.  please call us sooner if your blood sugar goes below 70, or if you have a lot of readings over 200.   Please come back for  a follow-up appointment in 2 months.

## 2019-11-25 ENCOUNTER — Telehealth: Payer: Self-pay | Admitting: Internal Medicine

## 2019-11-25 MED ORDER — ONETOUCH ULTRASOFT LANCETS MISC: 1.0000 | Freq: Two times a day (BID) | 3 refills | Status: DC

## 2019-11-25 MED ORDER — ONETOUCH ULTRA 2 W/DEVICE KIT: PACK | 0 refills | Status: DC

## 2019-11-25 MED ORDER — ONETOUCH ULTRA VI STRP: 1.0000 | ORAL_STRIP | Freq: Two times a day (BID) | 12 refills | Status: DC

## 2019-11-25 NOTE — Telephone Encounter (Signed)
[place] with [employer]  [phone]  called this morning and was requesting that the patient needed a prescription for a new glucometer and can be sent to [facility].

## 2019-11-25 NOTE — Telephone Encounter (Signed)
Reviewed chart pt is up-to-date sent meter w/supplies to [facility].Marland KitchenJohny Chess

## 2019-12-27 ENCOUNTER — Other Ambulatory Visit: Payer: Self-pay

## 2019-12-29 ENCOUNTER — Encounter: Payer: Self-pay | Admitting: Endocrinology

## 2019-12-29 ENCOUNTER — Other Ambulatory Visit: Payer: Self-pay

## 2019-12-29 ENCOUNTER — Ambulatory Visit (INDEPENDENT_AMBULATORY_CARE_PROVIDER_SITE_OTHER): Payer: Medicare Other | Admitting: Endocrinology

## 2019-12-29 VITALS — BP 160/70 | HR 77 | Ht 68.0 in | Wt 248.0 lb

## 2019-12-29 DIAGNOSIS — E1151 Type 2 diabetes mellitus with diabetic peripheral angiopathy without gangrene: Secondary | ICD-10-CM

## 2019-12-29 DIAGNOSIS — Z794 Long term (current) use of insulin: Secondary | ICD-10-CM

## 2019-12-29 LAB — POCT GLYCOSYLATED HEMOGLOBIN (HGB A1C): Hemoglobin A1C: 9.9 % — AB (ref 4.0–5.6)

## 2019-12-29 MED ORDER — TOUJEO MAX SOLOSTAR 300 UNIT/ML ~~LOC~~ SOPN: 180.0000 [IU] | PEN_INJECTOR | SUBCUTANEOUS | 11 refills | Status: DC

## 2019-12-29 MED ORDER — TRULICITY 0.75 MG/0.5ML ~~LOC~~ SOAJ: 0.7500 mg | SUBCUTANEOUS | 11 refills | Status: DC

## 2019-12-29 NOTE — Patient Instructions (Addendum)
Your blood pressure is high today.  Please see your primary care provider soon, to have it rechecked I have sent a prescription to your pharmacy, to add "Trulicity."  please reduce the Toujeo to 180 units each morning check your blood sugar twice a day.  vary the time of day when you check, between before the 3 meals, and at bedtime.  also check if you have symptoms of your blood sugar being too high or too low.  please keep a record of the readings and bring it to your next appointment here (or you can bring the meter itself).  You can write it on any piece of paper.  please call us sooner if your blood sugar goes below 70, or if you have a lot of readings over 200. Please come back for a follow-up appointment in 6 weeks.

## 2019-12-29 NOTE — Progress Notes (Signed)
Subjective:    Patient ID: [name], male    DOB: 29-Jul-1956, 64 y.o.   MRN: [id number]  HPI Pt returns for f/u of diabetes mellitus: DM type: Insulin-requiring type 2.   Dx'ed: 3794 Complications: PAD, CRI, and PN.   Therapy: insulin since 2009.   DKA: never Severe hypoglycemia: never.   Pancreatitis: never.  Other: he takes a QD insulin regimen, after poor results with more frequent injections; he was changed to 70/30, due to pattern of cbg's, but he did poorly on this, too.   Interval history: He brings a his meter with his cbg's which I have reviewed today.  cbg's vary from 104-389.  It is in general higher as the day goes on.   Past Medical History:  Diagnosis Date  . Allergic rhinitis   . At risk for sleep apnea    STOP-BANG= 5   SENT TO PCP 03-14-2014  . Cataract    surgically removed bilateral  . Condyloma acuminatum of penis   . Diabetic neuropathy ([place])   . GERD (gastroesophageal reflux disease)   . History of bladder cancer    s/p  turbt  2013/   transitional cell carcinoma--   . History of condyloma acuminatum    PERINEAL AREA  W/ RECURRENCY  . History of gout   . Hyperlipidemia   . Hypertension   . Lower urinary tract symptoms (LUTS)   . Productive cough   . PVD (peripheral vascular disease) with claudication (HCC)    bilateral SFA disease-- right > left  and left tibial artery disease--  per duplex  . Smokers' cough ([place])   . Type 2 diabetes mellitus with insulin therapy ([place])    monitor by  dr ellsion  . Wears dentures    upper    Past Surgical History:  Procedure Laterality Date  . AXILLARY HIDRADENITIS EXCISION  1997  . CARDIOVASCULAR STRESS TEST  07-24-2014  dr Kathlyn [place]   Low risk lexiscan nuclear study with apical thinning and small inferolateral wall infarct at mid & basal level , no ischemia/  normal LVF and wall motion , ef 59%  . CATARACT EXTRACTION Left   . CATARACT EXTRACTION W/ INTRAOCULAR LENS IMPLANT Right   . CO2 LASER  APPLICATION N/A 11/04/6145   Procedure: CO2 LASER APPLICATION,PENIS, GROIN, ANUS;  Surgeon: [name], MD;  Location: [place];  Service: General;  Laterality: N/A;  . CO2 LASER APPLICATION N/A 05/09/5746   Procedure: CO2 LASER APPLICATION;  Surgeon: Kathie Rhodes, MD;  Location: [facility];  Service: Urology;  Laterality: N/A;  . CONDYLOMA EXCISION/FULGURATION N/A 05/21/2015   Procedure: CONDYLOMA REMOVAL;  Surgeon: Kathie Rhodes, MD;  Location: [facility];  Service: Urology;  Laterality: N/A;  . HEMORRHOID SURGERY  10/24/2014   Procedure: HEMORRHOIDECTOMY;  Surgeon: Michael Boston, MD;  Location: [facility];  Service: General;;  . INCISION AND DRAINAGE ABSCESS Left 10/24/2014   Procedure: INCISION AND DRAINAGE ABSCESS;  Surgeon: Michael Boston, MD;  Location: [place];  Service: General;  Laterality: Left;  . INGUINAL HIDRADENITIS EXCISION  1998, 1999  . LASER ABLATION CONDOLAMATA N/A 03/20/2014   Procedure: EXAM UNDER ANESTHESIA, REMOVAL/ABLATION OF CONDYLOMATA PENIS,GROINS, ANUS, ANAL CANAL;  Surgeon: [name], MD;  Location: [place];  Service: General;  Laterality: N/A;  groin and anus  . LASER ABLATION CONDOLAMATA N/A 10/24/2014   Procedure: LASER ABLATION CONDOLAMATA;  Surgeon: Michael Boston, MD;  Location: [place];  Service: General;  Laterality: N/A;  . LASER ABLATION OF PENILE AND PERIANAL WARTS  07-29-2007  Dr. Johney Maine  . LEFT SHOULDER SURGERY  2003  . MASS EXCISION N/A 10/24/2014   Procedure: EXCISION OF PERINEAL MASS/SINUS;  Surgeon: Michael Boston, MD;  Location: [place];  Service: General;  Laterality: N/A;  . MOHS SURGERY     back  . MOHS SURGERY  2017   face  . MULTIPLE TOOTH EXTRACTIONS    . PERINEAL HIDRADENITIS EXCISION  1998, 1999  . TRANSURETHRAL RESECTION OF BLADDER TUMOR  05/21/2012   Procedure: TRANSURETHRAL RESECTION OF BLADDER  TUMOR (TURBT);  Surgeon: Claybon Jabs, MD;  Location: [facility];  Service: Urology;  Laterality: N/A;       Social History   Socioeconomic History  . Marital status: Married    Spouse name: Not on file  . Number of children: Not on file  . Years of education: Not on file  . Highest education level: Not on file  Occupational History  . Occupation: Disabled  Tobacco Use  . Smoking status: Former Smoker    Packs/day: 1.00    Years: 41.00    Pack years: 41.00    Types: Cigars, Cigarettes    Quit date: 04/11/2016    Years since quitting: 3.7  . Smokeless tobacco: Never Used  Substance and Sexual Activity  . Alcohol use: No    Alcohol/week: 0.0 standard drinks  . Drug use: No  . Sexual activity: Not on file  Other Topics Concern  . Not on file  Social History Narrative  . Not on file   Social Determinants of Health   Financial Resource Strain:   . Difficulty of Paying Living Expenses:   Food Insecurity:   . Worried About Charity fundraiser in the Last Year:   . Arboriculturist in the Last Year:   Transportation Needs:   . Film/video editor (Medical):   Marland Kitchen Lack of Transportation (Non-Medical):   Physical Activity:   . Days of Exercise per Week:   . Minutes of Exercise per Session:   Stress:   . Feeling of Stress :   Social Connections:   . Frequency of Communication with Friends and Family:   . Frequency of Social Gatherings with Friends and Family:   . Attends Religious Services:   . Active Member of Clubs or Organizations:   . Attends Archivist Meetings:   Marland Kitchen Marital Status:   Intimate Partner Violence:   . Fear of Current or Ex-Partner:   . Emotionally Abused:   Marland Kitchen Physically Abused:   . Sexually Abused:     Current Outpatient Medications on File Prior to Visit  Medication Sig Dispense Refill  . aspirin EC 81 MG tablet Take 1 tablet (81 mg total) by mouth daily.    Marland Kitchen atorvastatin (LIPITOR) 40 MG tablet Take 1 40 mg tablet by  mouth daily 90 tablet 3  . Blood Glucose Monitoring Suppl (ONE TOUCH ULTRA 2) w/Device KIT Use as directed to check blood sugars twice a day 1 kit 0  . glucose blood (ONETOUCH ULTRA) test strip 1 each by Other route 2 (two) times daily. 100 each 12  . hydrochlorothiazide (HYDRODIURIL) 25 MG tablet Take 1 tablet (25 mg total) by mouth daily. 90 tablet 3  . Lancets (ONETOUCH ULTRASOFT) lancets 1 each by Other route 2 (two) times daily. Dx E11.9 and Z79.4 180 each  3  . loratadine (CLARITIN) 10 MG tablet Take 10 mg by mouth daily.    Marland Kitchen losartan (COZAAR) 100 MG tablet Take 1 tablet (100 mg total) by mouth daily. 90 tablet 3  . pantoprazole (PROTONIX) 40 MG tablet Take 1 tablet (40 mg total) by mouth daily. 90 tablet 3  . potassium chloride 20 MEQ/15ML (10%) SOLN Take 7.5 mLs (10 mEq total) by mouth daily. 225 mL 5  . diltiazem (CARDIZEM CD) 180 MG 24 hr capsule Take 1 capsule (180 mg total) by mouth daily. 90 capsule 3   No current facility-administered medications on file prior to visit.    Allergies  Allergen Reactions  . Cefepime Hives    Had allergic reaction to one of these 3 agents, unclear which one  . Metoprolol Hives    Had allergic reaction to one of these 3 agents, unclear which one  *Per RN, highly likely this is the cause of allergic rxn  . Myrbetriq [Mirabegron] Hives    Had allergic reaction to one of these 3 agents, unclear which one    Family History  Problem Relation Age of Onset  . Hypertension Mother   . Cancer Father        lung ca  . Diabetes Maternal Aunt        x 2  . Colon cancer Neg Hx   . Esophageal cancer Neg Hx   . Pancreatic cancer Neg Hx   . Prostate cancer Neg Hx   . Kidney disease Neg Hx   . Liver disease Neg Hx   . Lung cancer Neg Hx   . Rectal cancer Neg Hx   . Stomach cancer Neg Hx     BP (!) 160/70   Pulse 77   Ht _0  (1.727 m)   Wt 248 lb (112.5 kg)   SpO2 97%   BMI 37.71 kg/m    Review of Systems He denies hypoglycemia.        Objective:   Physical Exam VITAL SIGNS:  See vs page GENERAL: no distress Pulses: dorsalis pedis intact bilat.   MSK: no deformity of the feet CV: 1+ bilat leg edema, and bilat vv's.  Skin:  no ulcer on the feet, but there are bilat heavy calluses.  normal color and temp on the feet.   Neuro: sensation is intact to touch on the feet.   Ext: there is bilateral onychomycosis of the toenails.    Lab Results  Component Value Date   HGBA1C 9.9 (A) 12/29/2019        Assessment & Plan:  Insulin-requiring type 2 DM, with PAD: worse HTN: is noted today  Patient Instructions  Your blood pressure is high today.  Please see your primary care provider soon, to have it rechecked I have sent a prescription to your pharmacy, to add "Trulicity."  please reduce the Toujeo to 180 units each morning check your blood sugar twice a day.  vary the time of day when you check, between before the 3 meals, and at bedtime.  also check if you have symptoms of your blood sugar being too high or too low.  please keep a record of the readings and bring it to your next appointment here (or you can bring the meter itself).  You can write it on any piece of paper.  please call us sooner if your blood sugar goes below 70, or if you have a lot of readings over 200. Please come back for a follow-up appointment  in 6 weeks.

## 2020-02-09 ENCOUNTER — Encounter: Payer: Self-pay | Admitting: Endocrinology

## 2020-02-09 ENCOUNTER — Other Ambulatory Visit: Payer: Self-pay

## 2020-02-09 ENCOUNTER — Ambulatory Visit (INDEPENDENT_AMBULATORY_CARE_PROVIDER_SITE_OTHER): Payer: Medicare Other | Admitting: Endocrinology

## 2020-02-09 VITALS — BP 172/76 | HR 75 | Ht 68.0 in | Wt 249.6 lb

## 2020-02-09 DIAGNOSIS — Z794 Long term (current) use of insulin: Secondary | ICD-10-CM | POA: Diagnosis not present

## 2020-02-09 DIAGNOSIS — E1151 Type 2 diabetes mellitus with diabetic peripheral angiopathy without gangrene: Secondary | ICD-10-CM

## 2020-02-09 LAB — POCT GLYCOSYLATED HEMOGLOBIN (HGB A1C): Hemoglobin A1C: 10.9 % — AB (ref 4.0–5.6)

## 2020-02-09 MED ORDER — TOUJEO MAX SOLOSTAR 300 UNIT/ML ~~LOC~~ SOPN: 220.0000 [IU] | PEN_INJECTOR | SUBCUTANEOUS | 11 refills | Status: DC

## 2020-02-09 NOTE — Progress Notes (Signed)
Subjective:    Patient ID: [name], male    DOB: February 10, 1956, 64 y.o.   MRN: [id number]  HPI Pt returns for f/u of diabetes mellitus: DM type: Insulin-requiring type 2.   Dx'ed: 4034 Complications: PAD, CRI, and PN.   Therapy: insulin since 2009.   DKA: never Severe hypoglycemia: never.   Pancreatitis: never.  Other: he takes a QD insulin regimen, after poor results with more frequent injections; he was changed to 70/30, due to pattern of cbg's, but he did poorly on this, too.   Interval history: He brings a his meter with his cbg's which I have reviewed today.  cbg's vary from 160-428.  It is in general higher as the day goes on.  Pt got Trulicity rx, but copay was over $200.   Past Medical History:  Diagnosis Date  . Allergic rhinitis   . At risk for sleep apnea    STOP-BANG= 5   SENT TO PCP 03-14-2014  . Cataract    surgically removed bilateral  . Condyloma acuminatum of penis   . Diabetic neuropathy ([place])   . GERD (gastroesophageal reflux disease)   . History of bladder cancer    s/p  turbt  2013/   transitional cell carcinoma--   . History of condyloma acuminatum    PERINEAL AREA  W/ RECURRENCY  . History of gout   . Hyperlipidemia   . Hypertension   . Lower urinary tract symptoms (LUTS)   . Productive cough   . PVD (peripheral vascular disease) with claudication (HCC)    bilateral SFA disease-- right > left  and left tibial artery disease--  per duplex  . Smokers' cough ([place])   . Type 2 diabetes mellitus with insulin therapy ([place])    monitor by  dr ellsion  . Wears dentures    upper    Past Surgical History:  Procedure Laterality Date  . AXILLARY HIDRADENITIS EXCISION  1997  . CARDIOVASCULAR STRESS TEST  07-24-2014  dr Kathlyn Sacramento   Low risk lexiscan nuclear study with apical thinning and small inferolateral wall infarct at mid & basal level , no ischemia/  normal LVF and wall motion , ef 59%  . CATARACT EXTRACTION Left   . CATARACT EXTRACTION W/  INTRAOCULAR LENS IMPLANT Right   . CO2 LASER APPLICATION N/A 7/42/5956   Procedure: CO2 LASER APPLICATION,PENIS, GROIN, ANUS;  Surgeon: [name], MD;  Location: [place];  Service: General;  Laterality: N/A;  . CO2 LASER APPLICATION N/A 38/75/6433   Procedure: CO2 LASER APPLICATION;  Surgeon: Kathie Rhodes, MD;  Location: [facility];  Service: Urology;  Laterality: N/A;  . CONDYLOMA EXCISION/FULGURATION N/A 05/21/2015   Procedure: CONDYLOMA REMOVAL;  Surgeon: Kathie Rhodes, MD;  Location: [facility];  Service: Urology;  Laterality: N/A;  . HEMORRHOID SURGERY  10/24/2014   Procedure: HEMORRHOIDECTOMY;  Surgeon: Michael Boston, MD;  Location: [facility];  Service: General;;  . INCISION AND DRAINAGE ABSCESS Left 10/24/2014   Procedure: INCISION AND DRAINAGE ABSCESS;  Surgeon: Michael Boston, MD;  Location: [place];  Service: General;  Laterality: Left;  . INGUINAL HIDRADENITIS EXCISION  1998, 1999  . LASER ABLATION CONDOLAMATA N/A 03/20/2014   Procedure: EXAM UNDER ANESTHESIA, REMOVAL/ABLATION OF CONDYLOMATA PENIS,GROINS, ANUS, ANAL CANAL;  Surgeon: [name], MD;  Location: [place];  Service: General;  Laterality: N/A;  groin and anus  . LASER ABLATION CONDOLAMATA N/A 10/24/2014  Procedure: LASER ABLATION CONDOLAMATA;  Surgeon: Michael Boston, MD;  Location: [facility];  Service: General;  Laterality: N/A;  . LASER ABLATION OF PENILE AND PERIANAL WARTS  07-29-2007  Dr. Johney Maine  . LEFT SHOULDER SURGERY  2003  . MASS EXCISION N/A 10/24/2014   Procedure: EXCISION OF PERINEAL MASS/SINUS;  Surgeon: Michael Boston, MD;  Location: [place];  Service: General;  Laterality: N/A;  . MOHS SURGERY     back  . MOHS SURGERY  2017   face  . MULTIPLE TOOTH EXTRACTIONS    . PERINEAL HIDRADENITIS EXCISION  1998, 1999  . TRANSURETHRAL RESECTION OF BLADDER TUMOR  05/21/2012    Procedure: TRANSURETHRAL RESECTION OF BLADDER TUMOR (TURBT);  Surgeon: Claybon Jabs, MD;  Location: [facility];  Service: Urology;  Laterality: N/A;       Social History   Socioeconomic History  . Marital status: Married    Spouse name: Not on file  . Number of children: Not on file  . Years of education: Not on file  . Highest education level: Not on file  Occupational History  . Occupation: Disabled  Tobacco Use  . Smoking status: Former Smoker    Packs/day: 1.00    Years: 41.00    Pack years: 41.00    Types: Cigars, Cigarettes    Quit date: 04/11/2016    Years since quitting: 3.8  . Smokeless tobacco: Never Used  Vaping Use  . Vaping Use: Former  Substance and Sexual Activity  . Alcohol use: No    Alcohol/week: 0.0 standard drinks  . Drug use: No  . Sexual activity: Not on file  Other Topics Concern  . Not on file  Social History Narrative  . Not on file   Social Determinants of Health   Financial Resource Strain:   . Difficulty of Paying Living Expenses:   Food Insecurity:   . Worried About Charity fundraiser in the Last Year:   . Arboriculturist in the Last Year:   Transportation Needs:   . Film/video editor (Medical):   Marland Kitchen Lack of Transportation (Non-Medical):   Physical Activity:   . Days of Exercise per Week:   . Minutes of Exercise per Session:   Stress:   . Feeling of Stress :   Social Connections:   . Frequency of Communication with Friends and Family:   . Frequency of Social Gatherings with Friends and Family:   . Attends Religious Services:   . Active Member of Clubs or Organizations:   . Attends Archivist Meetings:   Marland Kitchen Marital Status:   Intimate Partner Violence:   . Fear of Current or Ex-Partner:   . Emotionally Abused:   Marland Kitchen Physically Abused:   . Sexually Abused:     Current Outpatient Medications on File Prior to Visit  Medication Sig Dispense Refill  . aspirin EC 81 MG tablet Take 1 tablet (81 mg total)  by mouth daily.    Marland Kitchen atorvastatin (LIPITOR) 40 MG tablet Take 1 40 mg tablet by mouth daily 90 tablet 3  . Blood Glucose Monitoring Suppl (ONE TOUCH ULTRA 2) w/Device KIT Use as directed to check blood sugars twice a day 1 kit 0  . glucose blood (ONETOUCH ULTRA) test strip 1 each by Other route 2 (two) times daily. 100 each 12  . hydrochlorothiazide (HYDRODIURIL) 25 MG tablet Take 1 tablet (25 mg total) by mouth daily. 90 tablet 3  . Lancets (  ONETOUCH ULTRASOFT) lancets 1 each by Other route 2 (two) times daily. Dx E11.9 and Z79.4 180 each 3  . loratadine (CLARITIN) 10 MG tablet Take 10 mg by mouth daily.    Marland Kitchen losartan (COZAAR) 100 MG tablet Take 1 tablet (100 mg total) by mouth daily. 90 tablet 3  . pantoprazole (PROTONIX) 40 MG tablet Take 1 tablet (40 mg total) by mouth daily. 90 tablet 3  . potassium chloride 20 MEQ/15ML (10%) SOLN Take 7.5 mLs (10 mEq total) by mouth daily. 225 mL 5  . diltiazem (CARDIZEM CD) 180 MG 24 hr capsule Take 1 capsule (180 mg total) by mouth daily. 90 capsule 3   No current facility-administered medications on file prior to visit.    Allergies  Allergen Reactions  . Cefepime Hives    Had allergic reaction to one of these 3 agents, unclear which one  . Metoprolol Hives    Had allergic reaction to one of these 3 agents, unclear which one  *Per RN, highly likely this is the cause of allergic rxn  . Myrbetriq [Mirabegron] Hives    Had allergic reaction to one of these 3 agents, unclear which one    Family History  Problem Relation Age of Onset  . Hypertension Mother   . Cancer Father        lung ca  . Diabetes Maternal Aunt        x 2  . Colon cancer Neg Hx   . Esophageal cancer Neg Hx   . Pancreatic cancer Neg Hx   . Prostate cancer Neg Hx   . Kidney disease Neg Hx   . Liver disease Neg Hx   . Lung cancer Neg Hx   . Rectal cancer Neg Hx   . Stomach cancer Neg Hx     BP (!) 172/76   Pulse 75   Ht 5' 8"  (1.727 m)   Wt 249 lb 9.6 oz (113.2 kg)    SpO2 93%   BMI 37.95 kg/m    Review of Systems He denies hypoglycemia.      Objective:   Physical Exam VITAL SIGNS:  See vs page GENERAL: no distress Pulses: dorsalis pedis intact bilat.   MSK: no deformity of the feet CV: trace bilat leg edema, and bilat vv's.  Skin:  no ulcer on the feet, but there are bilat heavy calluses.  normal color and temp on the feet.   Neuro: sensation is intact to touch on the feet.   Ext: there is bilateral onychomycosis of the toenails.   Lab Results  Component Value Date   HGBA1C 10.9 (A) 02/09/2020        Assessment & Plan:  Insulin-requiring type 2 DM, with PAD: worse.  therapy limited by pt's request for least expensive meds. HTN: is noted today  Patient Instructions  Your blood pressure is high today.  Please see your primary care provider soon, to have it rechecked Please stop taking the Trulicity.   please increase the Toujeo to 220 units each morning.   check your blood sugar twice a day.  vary the time of day when you check, between before the 3 meals, and at bedtime.  also check if you have symptoms of your blood sugar being too high or too low.  please keep a record of the readings and bring it to your next appointment here (or you can bring the meter itself).  You can write it on any piece of paper.  please call us  sooner if your blood sugar goes below 70, or if you have a lot of readings over 200. Please come back for a follow-up appointment in 2 months.

## 2020-02-09 NOTE — Patient Instructions (Addendum)
Your blood pressure is high today.  Please see your primary care provider soon, to have it rechecked Please stop taking the Trulicity.   please increase the Toujeo to 220 units each morning.   check your blood sugar twice a day.  vary the time of day when you check, between before the 3 meals, and at bedtime.  also check if you have symptoms of your blood sugar being too high or too low.  please keep a record of the readings and bring it to your next appointment here (or you can bring the meter itself).  You can write it on any piece of paper.  please call us sooner if your blood sugar goes below 70, or if you have a lot of readings over 200. Please come back for a follow-up appointment in 2 months.

## 2020-02-23 ENCOUNTER — Encounter: Payer: Self-pay | Admitting: Cardiovascular Disease

## 2020-02-23 ENCOUNTER — Other Ambulatory Visit: Payer: Self-pay

## 2020-02-23 ENCOUNTER — Ambulatory Visit (INDEPENDENT_AMBULATORY_CARE_PROVIDER_SITE_OTHER): Payer: Medicare Other | Admitting: Cardiovascular Disease

## 2020-02-23 VITALS — BP 180/70 | HR 73 | Ht 68.0 in | Wt 252.0 lb

## 2020-02-23 DIAGNOSIS — I10 Essential (primary) hypertension: Secondary | ICD-10-CM | POA: Diagnosis not present

## 2020-02-23 DIAGNOSIS — E782 Mixed hyperlipidemia: Secondary | ICD-10-CM

## 2020-02-23 DIAGNOSIS — I471 Supraventricular tachycardia: Secondary | ICD-10-CM | POA: Diagnosis not present

## 2020-02-23 DIAGNOSIS — I2510 Atherosclerotic heart disease of native coronary artery without angina pectoris: Secondary | ICD-10-CM | POA: Diagnosis not present

## 2020-02-23 DIAGNOSIS — I739 Peripheral vascular disease, unspecified: Secondary | ICD-10-CM | POA: Diagnosis not present

## 2020-02-23 MED ORDER — SPIRONOLACTONE 25 MG PO TABS: 25.0000 mg | ORAL_TABLET | Freq: Every day | ORAL | 3 refills | Status: DC

## 2020-02-23 NOTE — Patient Instructions (Signed)
Medication Instructions:  Your physician has recommended you make the following change in your medication:  1. STOP Potassium 2. START Spironolactone 25 mg once a day  *If you need a refill on your cardiac medications before your next appointment, please call your pharmacy*   Lab Work: BMET to be done in 1 week at the [facility] of the hospital. No appointment needed.   If you have labs (blood work) drawn today and your tests are completely normal, you will receive your results only by: Marland Kitchen MyChart Message (if you have MyChart) OR . A paper copy in the mail If you have any lab test that is abnormal or we need to change your treatment, we will call you to review the results.   Testing/Procedures: Your physician has requested that you have a lower extremity arterial doppler. This test is an ultrasound of the arteries in the legs or arms. It looks at arterial blood flow in the legs and arms. Allow one hour for Lower and Upper Arterial scans. There are no restrictions or special instructions    Follow-Up: At [facility], you and your health needs are our priority.  As part of our continuing mission to provide you with exceptional heart care, we have created designated Provider Care Teams.  These Care Teams include your primary Cardiologist (physician) and Advanced Practice Providers (APPs -  Physician Assistants and Nurse Practitioners) who all work together to provide you with the care you need, when you need it.  We recommend signing up for the patient portal called "MyChart".  Sign up information is provided on this After Visit Summary.  MyChart is used to connect with patients for Virtual Visits (Telemedicine).  Patients are able to view lab/test results, encounter notes, upcoming appointments, etc.  Non-urgent messages can be sent to your provider as well.   To learn more about what you can do with MyChart, go to [email or web address].    Your next appointment:   3  month(s)  The format for your next appointment:   In Person  Provider:    You may see Kathlyn Sacramento, MD or one of the following Advanced Practice Providers on your designated Care Team:    Murray Hodgkins, NP  Christell Faith, PA-C  Marrianne Mood, PA-C

## 2020-02-23 NOTE — Progress Notes (Signed)
Cardiology Office Note   Date:  02/23/2020   ID:  Paul, [name] [birth year], MRN [id number]  PCP:  Biagio Borg, MD  Cardiologist:   Kathlyn Sacramento, MD   Chief Complaint  Patient presents with  . OTHER    6 month f/u c/o weight gain with his diabetic medication. Meds reviewed verbally with pt.      History of Present Illness:  [name] is a 64 y.o. male who presents for a follow up visit regarding PAD. He has known history of insulin-requiring DM (dx'ed 1992),  Hyperlipidemia, hypertension and tobacco use. He is followed for right calf claudication due to occluded right SFA. Noninvasive evaluation in August 2015 showed an ABI of 0.44 on the right and normal on the left. Duplex showed long occlusion of right SFA from mid to distal segment with reconstitution in popliteal artery. There was significant left SFA stenosis.   He had atypical chest pain in late 2015. Nuclear stress test showed a small inferolateral infarct with no significant ischemia. He was treated medically given that his chest pain resolved.  He quit smoking in December 2017.   He was hospitalized in October 2020 with sepsis secondary to UTI and pyelonephritis as well as hypertensive urgency.  His troponin was mildly elevated and peaked at 741.  CTA of the chest was negative for pulmonary embolism but he was noted to have coronary artery calcifications.  Echocardiogram during admission showed an EF of 60 to 65%, moderate LVH and no significant valvular abnormalities.  During his hospitalization, he was noted to have short runs of SVT in the setting of electrolyte abnormalities. The patient subsequently underwent outpatient cardiac CTA which showed coronary calcium score 412.  There was mild left main stenosis and mild first diagonal stenosis.  He had palpitations and thus was evaluated with a 14-day monitor which showed normal sinus rhythm with an average heart rate of 76 bpm.  He was noted to have frequent  short runs of SVT as well as paroxysmal atrial fibrillation with a burden of less than 1%.  The longest episode lasted 3 minutes.  The patient was switched from amlodipine to diltiazem.  He denies chest pain or shortness of breath.  He reports worsening bilateral leg claudication especially on the right side.  This is happening with minimal exertion and he started having some numbness and cold foot at rest on the right side.  His diabetes is still not controlled.  His blood pressure is also elevated.  He takes his medications regularly.  Past Medical History:  Diagnosis Date  . Allergic rhinitis   . At risk for sleep apnea    STOP-BANG= 5   SENT TO PCP 03-14-2014  . Cataract    surgically removed bilateral  . Condyloma acuminatum of penis   . Diabetic neuropathy ([place])   . GERD (gastroesophageal reflux disease)   . History of bladder cancer    s/p  turbt  2013/   transitional cell carcinoma--   . History of condyloma acuminatum    PERINEAL AREA  W/ RECURRENCY  . History of gout   . Hyperlipidemia   . Hypertension   . Lower urinary tract symptoms (LUTS)   . Productive cough   . PVD (peripheral vascular disease) with claudication (HCC)    bilateral SFA disease-- right > left  and left tibial artery disease--  per duplex  . Smokers' cough ([place])   . Type 2 diabetes mellitus with insulin therapy (  [place])    monitor by  dr Florina Ou  . Wears dentures    upper    Past Surgical History:  Procedure Laterality Date  . AXILLARY HIDRADENITIS EXCISION  1997  . CARDIOVASCULAR STRESS TEST  07-24-2014  dr Kathlyn Sacramento   Low risk lexiscan nuclear study with apical thinning and small inferolateral wall infarct at mid & basal level , no ischemia/  normal LVF and wall motion , ef 59%  . CATARACT EXTRACTION Left   . CATARACT EXTRACTION W/ INTRAOCULAR LENS IMPLANT Right   . CO2 LASER APPLICATION N/A 05/04/2682   Procedure: CO2 LASER APPLICATION,PENIS, GROIN, ANUS;  Surgeon: [name], MD;   Location: [place];  Service: General;  Laterality: N/A;  . CO2 LASER APPLICATION N/A 41/96/2229   Procedure: CO2 LASER APPLICATION;  Surgeon: Kathie Rhodes, MD;  Location[facility];  Service: Urology;  Laterality: N/A;  . CONDYLOMA EXCISION/FULGURATION N/A 05/21/2015   Procedure: CONDYLOMA REMOVAL;  Surgeon: Kathie Rhodes, MD;  Location: [facility];  Service: Urology;  Laterality: N/A;  . HEMORRHOID SURGERY  10/24/2014   Procedure: HEMORRHOIDECTOMY;  Surgeon: Michael Boston, MD;  Location: [facility];  Service: General;;  . INCISION AND DRAINAGE ABSCESS Left 10/24/2014   Procedure: INCISION AND DRAINAGE ABSCESS;  Surgeon: Michael Boston, MD;  Location: [place];  Service: General;  Laterality: Left;  . INGUINAL HIDRADENITIS EXCISION  1998, 1999  . LASER ABLATION CONDOLAMATA N/A 03/20/2014   Procedure: EXAM UNDER ANESTHESIA, REMOVAL/ABLATION OF CONDYLOMATA PENIS,GROINS, ANUS, ANAL CANAL;  Surgeon: [name], MD;  Location: [place];  Service: General;  Laterality: N/A;  groin and anus  . LASER ABLATION CONDOLAMATA N/A 10/24/2014   Procedure: LASER ABLATION CONDOLAMATA;  Surgeon: Michael Boston, MD;  Location: [facility];  Service: General;  Laterality: N/A;  . LASER ABLATION OF PENILE AND PERIANAL WARTS  07-29-2007  Dr. Johney Maine  . LEFT SHOULDER SURGERY  2003  . MASS EXCISION N/A 10/24/2014   Procedure: EXCISION OF PERINEAL MASS/SINUS;  Surgeon: Michael Boston, MD;  Location: [place];  Service: General;  Laterality: N/A;  . MOHS SURGERY     back  . MOHS SURGERY  2017   face  . MULTIPLE TOOTH EXTRACTIONS    . PERINEAL HIDRADENITIS EXCISION  1998, 1999  . TRANSURETHRAL RESECTION OF BLADDER TUMOR  05/21/2012   Procedure: TRANSURETHRAL RESECTION OF BLADDER TUMOR (TURBT);  Surgeon: Claybon Jabs, MD;  Location[facility];  Service: Urology;  Laterality:  N/A;        Current Outpatient Medications  Medication Sig Dispense Refill  . aspirin EC 81 MG tablet Take 1 tablet (81 mg total) by mouth daily.    Marland Kitchen atorvastatin (LIPITOR) 40 MG tablet Take 1 40 mg tablet by mouth daily 90 tablet 3  . Blood Glucose Monitoring Suppl (ONE TOUCH ULTRA 2) w/Device KIT Use as directed to check blood sugars twice a day 1 kit 0  . diltiazem (CARDIZEM CD) 180 MG 24 hr capsule Take 1 capsule (180 mg total) by mouth daily. 90 capsule 3  . glucose blood (ONETOUCH ULTRA) test strip 1 each by Other route 2 (two) times daily. 100 each 12  . hydrochlorothiazide (HYDRODIURIL) 25 MG tablet Take 1 tablet (25 mg total) by mouth daily. 90 tablet 3  . insulin glargine, 2 Unit Dial, (TOUJEO MAX SOLOSTAR) 300 UNIT/ML Solostar Pen Inject 220 Units into the skin every  morning. And pen needles 1/day (Patient taking differently: Inject 180 Units into the skin every morning. And pen needles 1/day) 10 pen 11  . Lancets (ONETOUCH ULTRASOFT) lancets 1 each by Other route 2 (two) times daily. Dx E11.9 and Z79.4 180 each 3  . loratadine (CLARITIN) 10 MG tablet Take 10 mg by mouth daily.    Marland Kitchen losartan (COZAAR) 100 MG tablet Take 1 tablet (100 mg total) by mouth daily. 90 tablet 3  . pantoprazole (PROTONIX) 40 MG tablet Take 1 tablet (40 mg total) by mouth daily. 90 tablet 3  . spironolactone (ALDACTONE) 25 MG tablet Take 1 tablet (25 mg total) by mouth daily. 90 tablet 3   No current facility-administered medications for this visit.    Allergies:   Cefepime, Metoprolol, and Myrbetriq [mirabegron]    Social History:  The patient  reports that he quit smoking about 3 years ago. His smoking use included cigars and cigarettes. He has a 41.00 pack-year smoking history. He has never used smokeless tobacco. He reports that he does not drink alcohol and does not use drugs.   Family History:  The patient's family history includes Cancer in his father; Diabetes in his maternal aunt; Hypertension  in his mother.    ROS:  Please see the history of present illness.   Otherwise, review of systems are positive for none.   All other systems are reviewed and negative.    PHYSICAL EXAM: VS:  BP (!) 180/70 (BP Location: Left Arm, Patient Position: Sitting, Cuff Size: Large)   Pulse 73   Ht 5' 8"  (1.727 m)   Wt 252 lb (114.3 kg)   SpO2 99%   BMI 38.32 kg/m  , BMI Body mass index is 38.32 kg/m. GEN: Well nourished, well developed, in no acute distress  HEENT: normal  Neck: no JVD, carotid bruits, or masses Cardiac: RRR; no murmurs, rubs, or gallops, trace edema  Respiratory:  clear to auscultation bilaterally, normal work of breathing GI: soft, nontender, nondistended, + BS MS: no deformity or atrophy  Skin: warm and dry, no rash Neuro:  Strength and sensation are intact Psych: euthymic mood, full affect   EKG:  EKG is ordered today. EKG showed normal sinus rhythm with no significant ST or T wave changes.   Recent Labs: 06/04/2019: B Natriuretic Peptide 112.6 06/05/2019: Magnesium 2.4 10/20/2019: ALT 37; BUN 16; Creatinine, Ser 1.03; Hemoglobin 14.3; Platelets 260.0; Potassium 4.0; Sodium 136; TSH 1.54    Lipid Panel    Component Value Date/Time   CHOL 96 10/20/2019 1442   CHOL 151 06/16/2019 1144   TRIG 151.0 (H) 10/20/2019 1442   HDL 30.20 (L) 10/20/2019 1442   HDL 28 (L) 06/16/2019 1144   CHOLHDL 3 10/20/2019 1442   VLDL 30.2 10/20/2019 1442   LDLCALC 35 10/20/2019 1442   LDLCALC 88 06/16/2019 1144   LDLDIRECT 67.0 06/15/2018 1652      Wt Readings from Last 3 Encounters:  02/23/20 252 lb (114.3 kg)  02/09/20 249 lb 9.6 oz (113.2 kg)  12/29/19 248 lb (112.5 kg)       ASSESSMENT AND PLAN:  1.  Peripheral arterial disease: Known occluded right SFA .he reports worsening bilateral leg claudication especially on the right side.  He also has numbness at rest.  I requested lower extremity arterial Doppler.  We might need to proceed with an angiogram based on the  results.  2. 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  Recommend aggressive medical therapy.  3.  Paroxysmal supraventricular tachycardia: A. fib burden was not significant enough to justify anticoagulation.  Symptoms are controlled with diltiazem.  4. Essential hypertension: Blood pressure is elevated in spite of 3 antihypertensive medications including a thiazide diuretic.  I elected to add spironolactone 25 mg once daily and discontinue potassium.  Check basic metabolic profile in 1 week.  If blood pressure remains uncontrolled, I will proceed with renal artery duplex to evaluate for possible renal artery stenosis.  5.  Hyperlipidemia: Continue treatment with atorvastatin.  Most recent LDL was 35.   Disposition:   FU with me in 3 months.  Signed,  Kathlyn Sacramento, MD  02/23/2020 2:43 PM    [place]

## 2020-02-29 ENCOUNTER — Other Ambulatory Visit
Admission: RE | Admit: 2020-02-29 | Discharge: 2020-02-29 | Disposition: A | Discharge status: Home / Self Care | Payer: Medicare Other | Attending: Cardiovascular Disease | Admitting: Cardiovascular Disease

## 2020-02-29 DIAGNOSIS — I739 Peripheral vascular disease, unspecified: Secondary | ICD-10-CM

## 2020-02-29 DIAGNOSIS — I2510 Atherosclerotic heart disease of native coronary artery without angina pectoris: Secondary | ICD-10-CM | POA: Insufficient documentation

## 2020-02-29 LAB — BASIC METABOLIC PANEL
Anion gap: 16 — ABNORMAL HIGH (ref 5–15)
BUN: 20 mg/dL (ref 8–23)
CO2: 26 mmol/L (ref 22–32)
Calcium: 9.4 mg/dL (ref 8.9–10.3)
Chloride: 95 mmol/L — ABNORMAL LOW (ref 98–111)
Creatinine, Ser: 1.06 mg/dL (ref 0.61–1.24)
GFR calc Af Amer: >60 mL/min (ref >60)
GFR calc non Af Amer: >60 mL/min (ref >60)
Glucose, Bld: 208 mg/dL — ABNORMAL HIGH (ref 70–99)
Potassium: 3.5 mmol/L (ref 3.5–5.1)
Sodium: 137 mmol/L (ref 135–145)

## 2020-03-01 ENCOUNTER — Other Ambulatory Visit: Payer: Self-pay | Admitting: Internal Medicine

## 2020-03-01 NOTE — Telephone Encounter (Signed)
Please refill as per office routine med refill policy (all routine meds refilled for 3 mo or monthly per pt preference up to one year from last visit, then month to month grace period for 3 mo, then further med refills will have to be denied)  

## 2020-03-05 ENCOUNTER — Telehealth: Payer: Self-pay | Admitting: Cardiovascular Disease

## 2020-03-05 NOTE — Telephone Encounter (Signed)
Patient made aware of lab results with verbalized understanding. 

## 2020-03-05 NOTE — Telephone Encounter (Signed)
Patient returning call for lab results Please call

## 2020-03-06 ENCOUNTER — Ambulatory Visit: Payer: Medicare Other | Admitting: Cardiovascular Disease

## 2020-03-16 ENCOUNTER — Telehealth: Payer: Self-pay | Admitting: Endocrinology

## 2020-03-16 ENCOUNTER — Other Ambulatory Visit: Payer: Self-pay

## 2020-03-16 DIAGNOSIS — Z794 Long term (current) use of insulin: Secondary | ICD-10-CM

## 2020-03-16 MED ORDER — TOUJEO MAX SOLOSTAR 300 UNIT/ML ~~LOC~~ SOPN: 220.0000 [IU] | PEN_INJECTOR | SUBCUTANEOUS | 99 refills | Status: DC

## 2020-03-16 NOTE — Telephone Encounter (Signed)
Confirming dosage. Documented in notes an previous Rx was 220. Below you reduced to 22. Is that correct?

## 2020-03-16 NOTE — Telephone Encounter (Signed)
Please refill PRN.  Dosage is 22 units qam

## 2020-03-16 NOTE — Telephone Encounter (Signed)
Medication Refill Request  Did you call your pharmacy and request this refill first? No, advised to do so in future  . If patient has not contacted pharmacy first, instruct them to do so for future refills.  . Remind them that contacting the pharmacy for their refill is the quickest method to get the refill.  . Refill policy also stated that it will take anywhere between 24-72 hours to receive the refill.    Name of medication? Toujeo Max with new dosing amounts and instructions  Is this a 90 day supply?  unknown  Name and location of pharmacy? [facility] on [address] in [place]

## 2020-03-16 NOTE — Telephone Encounter (Signed)
Should be 220 units qam

## 2020-03-16 NOTE — Telephone Encounter (Signed)
Outpatient Medication Detail   Disp Refills Start End   insulin glargine, 2 Unit Dial, (TOUJEO MAX SOLOSTAR) 300 UNIT/ML Solostar Pen 66 mL prn 03/16/2020    Sig - Route: Inject 220 Units into the skin every morning. - Subcutaneous   Sent to pharmacy as: insulin glargine, 2 Unit Dial, (TOUJEO MAX SOLOSTAR) 300 UNIT/ML Solostar Pen   E-Prescribing Status: Receipt confirmed by pharmacy (03/16/2020  2:35 PM EDT)

## 2020-03-21 ENCOUNTER — Other Ambulatory Visit: Payer: Self-pay | Admitting: Cardiovascular Disease

## 2020-03-21 DIAGNOSIS — I739 Peripheral vascular disease, unspecified: Secondary | ICD-10-CM

## 2020-03-23 ENCOUNTER — Telehealth: Payer: Self-pay | Admitting: Cardiovascular Disease

## 2020-03-23 NOTE — Telephone Encounter (Signed)
Spoke with patient to review his concerns. He states that he was having some extreme pain to his legs to the point he could barely walk. He ate banana's and the pain got better. He does have testing next week and advised that I would send provider message. He verbalized understanding with no further questions at this time.

## 2020-03-23 NOTE — Telephone Encounter (Signed)
Patient calling to confirm lea   He wants clinical to know he had significant issues with leg "lockinging up like a rock " this morning and he couldn't get off the couch .

## 2020-03-26 NOTE — Telephone Encounter (Signed)
Make sure he keeps his appointment for his lower extremity Doppler studies this week so that we can compare with previous test.

## 2020-03-27 NOTE — Telephone Encounter (Signed)
Left voicemail message to confirm appointment for tomorrow so that we can compare imaging to previous testing to make sure it has not changed. Instructed to return call if any further questions.

## 2020-03-28 ENCOUNTER — Ambulatory Visit (INDEPENDENT_AMBULATORY_CARE_PROVIDER_SITE_OTHER): Payer: Medicare Other

## 2020-03-28 ENCOUNTER — Other Ambulatory Visit: Payer: Self-pay

## 2020-03-28 DIAGNOSIS — I739 Peripheral vascular disease, unspecified: Secondary | ICD-10-CM | POA: Diagnosis not present

## 2020-03-28 NOTE — Telephone Encounter (Signed)
Spoke with patient and reviewed that provider wanted him to get his studies done today so that we can compare to previous testing to determine what may be causing the cramping. He verbalized understanding with no further questions at this time. Confirmed his appointment today and time.

## 2020-03-29 ENCOUNTER — Telehealth: Payer: Self-pay

## 2020-03-29 NOTE — Telephone Encounter (Signed)
-----   Message from Wellington Hampshire, MD sent at 03/28/2020  2:31 PM EDT ----- His ABI is normal on the left and mildly improved on the right.  This indicates no significant progression of peripheral arterial disease.  Recommend continuing medical therapy.

## 2020-03-29 NOTE — Telephone Encounter (Signed)
Patient made aware of results with verbalized understanding. 

## 2020-04-19 ENCOUNTER — Other Ambulatory Visit: Payer: Self-pay

## 2020-04-19 ENCOUNTER — Ambulatory Visit (INDEPENDENT_AMBULATORY_CARE_PROVIDER_SITE_OTHER): Payer: Medicare Other | Admitting: Endocrinology

## 2020-04-19 ENCOUNTER — Encounter: Payer: Self-pay | Admitting: Endocrinology

## 2020-04-19 ENCOUNTER — Telehealth: Payer: Self-pay

## 2020-04-19 VITALS — BP 142/60 | HR 73 | Ht 68.0 in | Wt 254.2 lb

## 2020-04-19 DIAGNOSIS — E1151 Type 2 diabetes mellitus with diabetic peripheral angiopathy without gangrene: Secondary | ICD-10-CM

## 2020-04-19 DIAGNOSIS — Z794 Long term (current) use of insulin: Secondary | ICD-10-CM

## 2020-04-19 LAB — POCT GLYCOSYLATED HEMOGLOBIN (HGB A1C): Hemoglobin A1C: 10.7 % — AB (ref 4.0–5.6)

## 2020-04-19 MED ORDER — TOUJEO MAX SOLOSTAR 300 UNIT/ML ~~LOC~~ SOPN: 250.0000 [IU] | PEN_INJECTOR | SUBCUTANEOUS | 99 refills | Status: DC

## 2020-04-19 NOTE — Telephone Encounter (Signed)
LVM to rtn my call to schedule awv with nha .

## 2020-04-19 NOTE — Patient Instructions (Addendum)
Your blood pressure is high today.  Please see your primary care provider soon, to have it rechecked.   please increase the Toujeo to 250 units each morning.   check your blood sugar twice a day.  vary the time of day when you check, between before the 3 meals, and at bedtime.  also check if you have symptoms of your blood sugar being too high or too low.  please keep a record of the readings and bring it to your next appointment here (or you can bring the meter itself).  You can write it on any piece of paper.  please call us sooner if your blood sugar goes below 70, or if you have a lot of readings over 200. Please come back for a follow-up appointment in 2 months.

## 2020-04-19 NOTE — Progress Notes (Signed)
Subjective:    Patient ID: [name], male    DOB: 1956-05-22, 64 y.o.   MRN: [id number]  HPI Pt returns for f/u of diabetes mellitus: DM type: Insulin-requiring type 2.   Dx'ed: 0722 Complications: PAD, CRI, and PN.   Therapy: insulin since 2009.   DKA: never Severe hypoglycemia: never.   Pancreatitis: never.  SDOH: he could not afford Trulicity.   Other: he takes a QD insulin regimen, after poor results with more frequent injections; he was changed to 70/30, due to pattern of cbg's, but he did poorly on this, too.   Interval history: He brings his meter with his cbg's which I have reviewed today.  cbg's vary from 137-344.  There is no trend throughout the day.   Past Medical History:  Diagnosis Date  . Allergic rhinitis   . At risk for sleep apnea    STOP-BANG= 5   SENT TO PCP 03-14-2014  . Cataract    surgically removed bilateral  . Condyloma acuminatum of penis   . Diabetic neuropathy ([place])   . GERD (gastroesophageal reflux disease)   . History of bladder cancer    s/p  turbt  2013/   transitional cell carcinoma--   . History of condyloma acuminatum    PERINEAL AREA  W/ RECURRENCY  . History of gout   . Hyperlipidemia   . Hypertension   . Lower urinary tract symptoms (LUTS)   . Productive cough   . PVD (peripheral vascular disease) with claudication (HCC)    bilateral SFA disease-- right > left  and left tibial artery disease--  per duplex  . Smokers' cough ([place])   . Type 2 diabetes mellitus with insulin therapy ([place])    monitor by  dr ellsion  . Wears dentures    upper    Past Surgical History:  Procedure Laterality Date  . AXILLARY HIDRADENITIS EXCISION  1997  . CARDIOVASCULAR STRESS TEST  07-24-2014  dr Kathlyn [place]   Low risk lexiscan nuclear study with apical thinning and small inferolateral wall infarct at mid & basal level , no ischemia/  normal LVF and wall motion , ef 59%  . CATARACT EXTRACTION Left   . CATARACT EXTRACTION W/ INTRAOCULAR LENS  IMPLANT Right   . CO2 LASER APPLICATION N/A 5/75/0518   Procedure: CO2 LASER APPLICATION,PENIS, GROIN, ANUS;  Surgeon: [name], MD;  Location: [place];  Service: General;  Laterality: N/A;  . CO2 LASER APPLICATION N/A 33/58/2518   Procedure: CO2 LASER APPLICATION;  Surgeon: Kathie Rhodes, MD;  Location: [facility];  Service: Urology;  Laterality: N/A;  . CONDYLOMA EXCISION/FULGURATION N/A 05/21/2015   Procedure: CONDYLOMA REMOVAL;  Surgeon: Kathie Rhodes, MD;  Location: [facility];  Service: Urology;  Laterality: N/A;  . HEMORRHOID SURGERY  10/24/2014   Procedure: HEMORRHOIDECTOMY;  Surgeon: Michael Boston, MD;  Location: [facility];  Service: General;;  . INCISION AND DRAINAGE ABSCESS Left 10/24/2014   Procedure: INCISION AND DRAINAGE ABSCESS;  Surgeon: Michael Boston, MD;  Location: [place];  Service: General;  Laterality: Left;  . INGUINAL HIDRADENITIS EXCISION  1998, 1999  . LASER ABLATION CONDOLAMATA N/A 03/20/2014   Procedure: EXAM UNDER ANESTHESIA, REMOVAL/ABLATION OF CONDYLOMATA PENIS,GROINS, ANUS, ANAL CANAL;  Surgeon: [name], MD;  Location: [place];  Service: General;  Laterality: N/A;  groin and anus  . LASER ABLATION CONDOLAMATA N/A 10/24/2014   Procedure: LASER ABLATION CONDOLAMATA;  Surgeon:  Michael Boston, MD;  Location: [facility];  Service: General;  Laterality: N/A;  . LASER ABLATION OF PENILE AND PERIANAL WARTS  07-29-2007  Dr. Johney Maine  . LEFT SHOULDER SURGERY  2003  . MASS EXCISION N/A 10/24/2014   Procedure: EXCISION OF PERINEAL MASS/SINUS;  Surgeon: Michael Boston, MD;  Location: [place];  Service: General;  Laterality: N/A;  . MOHS SURGERY     back  . MOHS SURGERY  2017   face  . MULTIPLE TOOTH EXTRACTIONS    . PERINEAL HIDRADENITIS EXCISION  1998, 1999  . TRANSURETHRAL RESECTION OF BLADDER TUMOR  05/21/2012   Procedure:  TRANSURETHRAL RESECTION OF BLADDER TUMOR (TURBT);  Surgeon: Claybon Jabs, MD;  Location: [facility];  Service: Urology;  Laterality: N/A;       Social History   Socioeconomic History  . Marital status: Married    Spouse name: Not on file  . Number of children: Not on file  . Years of education: Not on file  . Highest education level: Not on file  Occupational History  . Occupation: Disabled  Tobacco Use  . Smoking status: Former Smoker    Packs/day: 1.00    Years: 41.00    Pack years: 41.00    Types: Cigars, Cigarettes    Quit date: 04/11/2016    Years since quitting: 4.0  . Smokeless tobacco: Never Used  Vaping Use  . Vaping Use: Former  Substance and Sexual Activity  . Alcohol use: No    Alcohol/week: 0.0 standard drinks  . Drug use: No  . Sexual activity: Not on file  Other Topics Concern  . Not on file  Social History Narrative  . Not on file   Social Determinants of Health   Financial Resource Strain:   . Difficulty of Paying Living Expenses: Not on file  Food Insecurity:   . Worried About Charity fundraiser in the Last Year: Not on file  . Ran Out of Food in the Last Year: Not on file  Transportation Needs:   . Lack of Transportation (Medical): Not on file  . Lack of Transportation (Non-Medical): Not on file  Physical Activity:   . Days of Exercise per Week: Not on file  . Minutes of Exercise per Session: Not on file  Stress:   . Feeling of Stress : Not on file  Social Connections:   . Frequency of Communication with Friends and Family: Not on file  . Frequency of Social Gatherings with Friends and Family: Not on file  . Attends Religious Services: Not on file  . Active Member of Clubs or Organizations: Not on file  . Attends Archivist Meetings: Not on file  . Marital Status: Not on file  Intimate Partner Violence:   . Fear of Current or Ex-Partner: Not on file  . Emotionally Abused: Not on file  . Physically Abused: Not on  file  . Sexually Abused: Not on file    Current Outpatient Medications on File Prior to Visit  Medication Sig Dispense Refill  . aspirin EC 81 MG tablet Take 1 tablet (81 mg total) by mouth daily.    Marland Kitchen atorvastatin (LIPITOR) 40 MG tablet Take 1 40 mg tablet by mouth daily 90 tablet 3  . Blood Glucose Monitoring Suppl (ONE TOUCH ULTRA 2) w/Device KIT Use as directed to check blood sugars twice a day 1 kit 0  . glucose blood (ONETOUCH ULTRA) test strip 1 each by Other  route 2 (two) times daily. 100 each 12  . hydrochlorothiazide (HYDRODIURIL) 25 MG tablet TAKE 1 TABLET BY MOUTH  DAILY 90 tablet 3  . Lancets (ONETOUCH ULTRASOFT) lancets 1 each by Other route 2 (two) times daily. Dx E11.9 and Z79.4 180 each 3  . loratadine (CLARITIN) 10 MG tablet Take 10 mg by mouth daily.    Marland Kitchen losartan (COZAAR) 100 MG tablet TAKE 1 TABLET BY MOUTH  DAILY 90 tablet 3  . pantoprazole (PROTONIX) 40 MG tablet Take 1 tablet (40 mg total) by mouth daily. 90 tablet 3  . spironolactone (ALDACTONE) 25 MG tablet Take 1 tablet (25 mg total) by mouth daily. 90 tablet 3  . diltiazem (CARDIZEM CD) 180 MG 24 hr capsule Take 1 capsule (180 mg total) by mouth daily. 90 capsule 3   No current facility-administered medications on file prior to visit.    Allergies  Allergen Reactions  . Cefepime Hives    Had allergic reaction to one of these 3 agents, unclear which one  . Metoprolol Hives    Had allergic reaction to one of these 3 agents, unclear which one  *Per RN, highly likely this is the cause of allergic rxn  . Myrbetriq [Mirabegron] Hives    Had allergic reaction to one of these 3 agents, unclear which one    Family History  Problem Relation Age of Onset  . Hypertension Mother   . Cancer Father        lung ca  . Diabetes Maternal Aunt        x 2  . Colon cancer Neg Hx   . Esophageal cancer Neg Hx   . Pancreatic cancer Neg Hx   . Prostate cancer Neg Hx   . Kidney disease Neg Hx   . Liver disease Neg Hx     . Lung cancer Neg Hx   . Rectal cancer Neg Hx   . Stomach cancer Neg Hx     BP (!) 142/60   Pulse 73   Ht 5' 8"  (1.727 m)   Wt 254 lb 3.2 oz (115.3 kg)   SpO2 93%   BMI 38.65 kg/m    Review of Systems He denies hypoglycemia.     Objective:   Physical Exam VITAL SIGNS:  See vs page GENERAL: no distress Pulses: dorsalis pedis intact bilat.   MSK: no deformity of the feet CV: trace bilat leg edema, and bilat vv's.  Skin:  no ulcer on the feet, but there are bilat heavy calluses.  normal color and temp on the feet.   Neuro: sensation is intact to touch on the feet.   Ext: there is bilateral onychomycosis of the toenails.    Lab Results  Component Value Date   HGBA1C 10.7 (A) 04/19/2020       Assessment & Plan:  HTN: is noted today Insulin-requiring type 2 DM: uncontrolled.  Patient Instructions  Your blood pressure is high today.  Please see your primary care provider soon, to have it rechecked.   please increase the Toujeo to 250 units each morning.   check your blood sugar twice a day.  vary the time of day when you check, between before the 3 meals, and at bedtime.  also check if you have symptoms of your blood sugar being too high or too low.  please keep a record of the readings and bring it to your next appointment here (or you can bring the meter itself).  You can write it on  any piece of paper.  please call us sooner if your blood sugar goes below 70, or if you have a lot of readings over 200. Please come back for a follow-up appointment in 2 months.

## 2020-04-23 ENCOUNTER — Other Ambulatory Visit: Payer: Self-pay | Admitting: Internal Medicine

## 2020-04-23 NOTE — Telephone Encounter (Signed)
Please refill as per office routine med refill policy (all routine meds refilled for 3 mo or monthly per pt preference up to one year from last visit, then month to month grace period for 3 mo, then further med refills will have to be denied)  

## 2020-04-26 ENCOUNTER — Ambulatory Visit (INDEPENDENT_AMBULATORY_CARE_PROVIDER_SITE_OTHER): Payer: Medicare Other | Admitting: Internal Medicine

## 2020-04-26 ENCOUNTER — Encounter: Payer: Self-pay | Admitting: Internal Medicine

## 2020-04-26 ENCOUNTER — Other Ambulatory Visit: Payer: Self-pay

## 2020-04-26 VITALS — BP 180/56 | HR 70 | Temp 98.0°F | Ht 68.0 in | Wt 256.0 lb

## 2020-04-26 DIAGNOSIS — Z23 Encounter for immunization: Secondary | ICD-10-CM | POA: Diagnosis not present

## 2020-04-26 DIAGNOSIS — Z0001 Encounter for general adult medical examination with abnormal findings: Secondary | ICD-10-CM

## 2020-04-26 DIAGNOSIS — E1151 Type 2 diabetes mellitus with diabetic peripheral angiopathy without gangrene: Secondary | ICD-10-CM | POA: Diagnosis not present

## 2020-04-26 DIAGNOSIS — I10 Essential (primary) hypertension: Secondary | ICD-10-CM | POA: Diagnosis not present

## 2020-04-26 DIAGNOSIS — E559 Vitamin D deficiency, unspecified: Secondary | ICD-10-CM | POA: Diagnosis not present

## 2020-04-26 DIAGNOSIS — Z794 Long term (current) use of insulin: Secondary | ICD-10-CM

## 2020-04-26 DIAGNOSIS — E7800 Pure hypercholesterolemia, unspecified: Secondary | ICD-10-CM | POA: Diagnosis not present

## 2020-04-26 MED ORDER — DILTIAZEM HCL ER COATED BEADS 240 MG PO CP24: 240.0000 mg | ORAL_CAPSULE | Freq: Every day | ORAL | 3 refills | Status: DC

## 2020-04-26 NOTE — Assessment & Plan Note (Signed)
Cont oral replacement 

## 2020-04-26 NOTE — Assessment & Plan Note (Signed)
To f/u endo 

## 2020-04-26 NOTE — Assessment & Plan Note (Signed)
stable overall by history and exam, recent data reviewed with pt, and pt to continue medical treatment as before,  to f/u any worsening symptoms or concerns  

## 2020-04-26 NOTE — Progress Notes (Signed)
Subjective:    Patient ID: [name], male    DOB: May 11, 1956, 64 y.o.   MRN: [id number]  HPI  Here to f/u; overall doing ok,  Pt denies chest pain, increasing sob or doe, wheezing, orthopnea, PND, [name], palpitations, dizziness or syncope.  Pt denies new neurological symptoms such as new headache, or facial or extremity weakness or numbness.  Pt denies polydipsia, polyuria, or low sugar episode.  Pt states overall good compliance with meds, mostly trying to follow appropriate diet, with wt overall stable,  but little exercise however. BP Readings from Last 3 Encounters:  04/26/20 (!) 180/56  04/19/20 (!) 142/60  02/23/20 (!) 180/70   Past Medical History:  Diagnosis Date   Allergic rhinitis    At risk for sleep apnea    STOP-BANG= 5   SENT TO PCP 03-14-2014   Cataract    surgically removed bilateral   Condyloma acuminatum of penis    Diabetic neuropathy (HCC)    GERD (gastroesophageal reflux disease)    History of bladder cancer    s/p  turbt  2013/   transitional cell carcinoma--    History of condyloma acuminatum    PERINEAL AREA  W/ RECURRENCY   History of gout    Hyperlipidemia    Hypertension    Lower urinary tract symptoms (LUTS)    Productive cough    PVD (peripheral vascular disease) with claudication (HCC)    bilateral SFA disease-- right > left  and left tibial artery disease--  per duplex   Smokers' cough ([place])    Type 2 diabetes mellitus with insulin therapy ([place])    monitor by  dr ellsion   Wears dentures    upper   Past Surgical History:  Procedure Laterality Date   AXILLARY HIDRADENITIS EXCISION  1997   CARDIOVASCULAR STRESS TEST  07-24-2014  dr Kathlyn [place]   Low risk lexiscan nuclear study with apical thinning and small inferolateral wall infarct at mid & basal level , no ischemia/  normal LVF and wall motion , ef 59%   CATARACT EXTRACTION Left    CATARACT EXTRACTION W/ INTRAOCULAR LENS IMPLANT Right    CO2  LASER APPLICATION N/A 5/91/6384   Procedure: CO2 LASER APPLICATION,PENIS, GROIN, ANUS;  Surgeon: [name], MD;  Location: [place];  Service: General;  Laterality: N/A;   CO2 LASER APPLICATION N/A 66/59/9357   Procedure: CO2 LASER APPLICATION;  Surgeon: Kathie Rhodes, MD;  Location: [place];  Service: Urology;  Laterality: N/A;   CONDYLOMA EXCISION/FULGURATION N/A 05/21/2015   Procedure: CONDYLOMA REMOVAL;  Surgeon: Kathie Rhodes, MD;  Location: [facility];  Service: Urology;  Laterality: N/A;   HEMORRHOID SURGERY  10/24/2014   Procedure: HEMORRHOIDECTOMY;  Surgeon: Michael Boston, MD;  Location: [name];  Service: General;;   INCISION AND DRAINAGE ABSCESS Left 10/24/2014   Procedure: INCISION AND DRAINAGE ABSCESS;  Surgeon: Michael Boston, MD;  Location: [place];  Service: General;  Laterality: Left;   INGUINAL HIDRADENITIS [place] N/A 03/20/2014   Procedure: EXAM UNDER ANESTHESIA, REMOVAL/ABLATION OF CONDYLOMATA PENIS,GROINS, ANUS, ANAL CANAL;  Surgeon: [name], MD;  Location: [place];  Service: General;  Laterality: N/A;  groin and anus   LASER ABLATION CONDOLAMATA N/A 10/24/2014   Procedure: LASER ABLATION CONDOLAMATA;  Surgeon: Michael Boston, MD;  Location: [place];  Service: General;  Laterality:  N/A;   LASER ABLATION OF PENILE AND PERIANAL WARTS  07-29-2007  Dr. Johney Maine   LEFT SHOULDER SURGERY  2003   MASS EXCISION N/A 10/24/2014   Procedure: EXCISION OF PERINEAL MASS/SINUS;  Surgeon: Michael Boston, MD;  Location: [place];  Service: General;  Laterality: N/A;   MOHS SURGERY     back   MOHS SURGERY  2017   face   MULTIPLE TOOTH EXTRACTIONS     PERINEAL HIDRADENITIS [place] RESECTION OF BLADDER TUMOR  05/21/2012   Procedure: TRANSURETHRAL RESECTION OF  BLADDER TUMOR (TURBT);  Surgeon: Claybon Jabs, MD;  Location: [facility];  Service: Urology;  Laterality: N/A;       reports that he quit smoking about 4 years ago. His smoking use included cigars and cigarettes. He has a 41.00 pack-year smoking history. He has never used smokeless tobacco. He reports that he does not drink alcohol and does not use drugs. family history includes Cancer in his father; Diabetes in his maternal aunt; Hypertension in his mother. Allergies  Allergen Reactions   Cefepime Hives    Had allergic reaction to one of these 3 agents, unclear which one   Metoprolol Hives    Had allergic reaction to one of these 3 agents, unclear which one  *Per RN, highly likely this is the cause of allergic rxn   Myrbetriq [Mirabegron] Hives    Had allergic reaction to one of these 3 agents, unclear which one   Current Outpatient Medications on File Prior to Visit  Medication Sig Dispense Refill   aspirin EC 81 MG tablet Take 1 tablet (81 mg total) by mouth daily.     atorvastatin (LIPITOR) 40 MG tablet Take 1 40 mg tablet by mouth daily 90 tablet 3   Blood Glucose Monitoring Suppl (ONE TOUCH ULTRA 2) w/Device KIT Use as directed to check blood sugars twice a day 1 kit 0   glucose blood (ONETOUCH ULTRA) test strip 1 each by Other route 2 (two) times daily. 100 each 12   hydrochlorothiazide (HYDRODIURIL) 25 MG tablet TAKE 1 TABLET BY MOUTH  DAILY 90 tablet 3   insulin glargine, 2 Unit Dial, (TOUJEO MAX SOLOSTAR) 300 UNIT/ML Solostar Pen Inject 250 Units into the skin every morning. 66 mL prn   Lancets (ONETOUCH ULTRASOFT) lancets 1 each by Other route 2 (two) times daily. Dx E11.9 and Z79.4 180 each 3   loratadine (CLARITIN) 10 MG tablet Take 10 mg by mouth daily.     losartan (COZAAR) 100 MG tablet TAKE 1 TABLET BY MOUTH  DAILY 90 tablet 3   pantoprazole (PROTONIX) 40 MG tablet TAKE 1 TABLET BY MOUTH  DAILY 90 tablet 3   spironolactone (ALDACTONE) 25 MG  tablet Take 1 tablet (25 mg total) by mouth daily. 90 tablet 3   No current facility-administered medications on file prior to visit.   Review of Systems All otherwise neg per pt    Objective:   Physical Exam BP (!) 180/56 (BP Location: Left Arm, Patient Position: Sitting, Cuff Size: Large)    Pulse 70    Temp 98 F (36.7 C) (Oral)    Ht 5' 8" (1.727 m)    Wt 256 lb (116.1 kg)    SpO2 97%    BMI 38.92 kg/m  VS noted,  Constitutional: Pt appears in NAD HENT: Head: NCAT.  Right Ear: External ear normal.  Left Ear: External ear normal.  Eyes: . Pupils  are equal, round, and reactive to light. Conjunctivae and EOM are normal Nose: without d/c or deformity Neck: Neck supple. Gross normal ROM Cardiovascular: Normal rate and regular rhythm.   Pulmonary/Chest: Effort normal and breath sounds without rales or wheezing.  Abd:  Soft, NT, ND, + BS, no organomegaly Neurological: Pt is alert. At baseline orientation, motor grossly intact Skin: Skin is warm. No rashes, other new lesions, no LE edema Psychiatric: Pt behavior is normal without agitation  All otherwise neg per pt Lab Results  Component Value Date   WBC 11.7 (H) 10/20/2019   HGB 14.3 10/20/2019   HCT 41.5 10/20/2019   PLT 260.0 10/20/2019   GLUCOSE 208 (H) 02/29/2020   CHOL 96 10/20/2019   TRIG 151.0 (H) 10/20/2019   HDL 30.20 (L) 10/20/2019   LDLDIRECT 67.0 06/15/2018   LDLCALC 35 10/20/2019   ALT 37 10/20/2019   AST 28 10/20/2019   NA 137 02/29/2020   K 3.5 02/29/2020   CL 95 (L) 02/29/2020   CREATININE 1.06 02/29/2020   BUN 20 02/29/2020   CO2 26 02/29/2020   TSH 1.54 10/20/2019   PSA 3.73 10/20/2019   INR 1.1 06/04/2019   HGBA1C 10.7 (A) 04/19/2020   MICROALBUR 24.0 (H) 10/20/2019      Assessment & Plan:

## 2020-04-26 NOTE — Patient Instructions (Addendum)
You had the flu shot today  Ok to increase the diltiazem ER to 240 mg per day  Please continue all other medications as before, and refills have been done if requested.  Please have the pharmacy call with any other refills you may need.  Please continue your efforts at being more active, low cholesterol diet, and weight control.  Please keep your appointments with your specialists as you may have planned  Please make an Appointment to return in 6 months, or sooner if needed, also with Lab Appointment for testing done 3-5 days before at the [facility] (so this is for TWO appointments - please see the scheduling desk as you leave)

## 2020-04-26 NOTE — Assessment & Plan Note (Addendum)
Uncontrolled, to increase diltiazem er 240 qd  I spent 31 minutes in preparing to see the patient by review of recent labs, imaging and procedures, obtaining and reviewing separately obtained history, communicating with the patient and family or caregiver, ordering medications, tests or procedures, and documenting clinical information in the EHR including the differential Dx, treatment, and any further evaluation and other management of htn, dm, hld, vit d def

## 2020-05-08 ENCOUNTER — Telehealth: Payer: Self-pay

## 2020-05-08 DIAGNOSIS — Z794 Long term (current) use of insulin: Secondary | ICD-10-CM

## 2020-05-08 MED ORDER — TOUJEO MAX SOLOSTAR 300 UNIT/ML ~~LOC~~ SOPN: 250.0000 [IU] | PEN_INJECTOR | SUBCUTANEOUS | 1 refills | Status: DC

## 2020-05-08 NOTE — Telephone Encounter (Signed)
New message    Need an update prescription sent over to [facility].   insulin glargine, 2 Unit Dial, (TOUJEO MAX SOLOSTAR) 300 UNIT/ML Solostar Pen

## 2020-05-08 NOTE — Telephone Encounter (Signed)
RX sent

## 2020-05-15 ENCOUNTER — Ambulatory Visit (INDEPENDENT_AMBULATORY_CARE_PROVIDER_SITE_OTHER): Payer: Medicare Other

## 2020-05-15 DIAGNOSIS — Z0000 Encounter for general adult medical examination without abnormal findings: Secondary | ICD-10-CM | POA: Diagnosis not present

## 2020-05-15 NOTE — Patient Instructions (Addendum)
[name] , Thank you for taking time to come for your Medicare Wellness Visit. I appreciate your ongoing commitment to your health goals. Please review the following plan we discussed and let me know if I can assist you in the future.   Screening recommendations/referrals: Colonoscopy: 07/22/2018; due every 3 years Recommended yearly ophthalmology/optometry visit for glaucoma screening and checkup Recommended yearly dental visit for hygiene and checkup  Vaccinations: Influenza vaccine: 04/26/2020 Pneumococcal vaccine: Pneumovax 23 completed on 05/05/2016; need Prevnar 13 Tdap vaccine: 06/05/2016; due every 10 years Shingles vaccine: never done Covid-19: completed  Advanced directives: Advance directive discussed with you today. Even though you declined this today please call our office should you change your mind and we can give you the proper paperwork for you to fill out.  Conditions/risks identified: Yes. Reviewed health maintenance screenings with patient today and relevant education, vaccines, and/or referrals were provided. Continue doing brain stimulating activities (puzzles, reading, adult coloring books, staying active) to keep memory sharp. Continue to eat heart healthy diet (full of fruits, vegetables, whole grains, lean protein, water--limit salt, fat, and sugar intake) and increase physical activity as tolerated.  Next appointment: Please schedule your next Medicare Wellness Visit with your Nurse Health Advisor in 1 year by calling [phone].  Preventive Care 40-64 Years, Male Preventive care refers to lifestyle choices and visits with your health care provider that can promote health and wellness. What does preventive care include?  A yearly physical exam. This is also called an annual well check.  Dental exams once or twice a year.  Routine eye exams. Ask your health care provider how often you should have your eyes checked.  Personal lifestyle choices,  including:  Daily care of your teeth and gums.  Regular physical activity.  Eating a healthy diet.  Avoiding tobacco and drug use.  Limiting alcohol use.  Practicing safe sex.  Taking low-dose aspirin every day starting at age 61. What happens during an annual well check? The services and screenings done by your health care provider during your annual well check will depend on your age, overall health, lifestyle risk factors, and family history of disease. Counseling  Your health care provider may ask you questions about your:  Alcohol use.  Tobacco use.  Drug use.  Emotional well-being.  Home and relationship well-being.  Sexual activity.  Eating habits.  Work and work Statistician. Screening  You may have the following tests or measurements:  Height, weight, and BMI.  Blood pressure.  Lipid and cholesterol levels. These may be checked every 5 years, or more frequently if you are over 59 years old.  Skin check.  Lung cancer screening. You may have this screening every year starting at age 71 if you have a 30-pack-year history of smoking and currently smoke or have quit within the past 15 years.  Fecal occult blood test (FOBT) of the stool. You may have this test every year starting at age 31.  Flexible sigmoidoscopy or colonoscopy. You may have a sigmoidoscopy every 5 years or a colonoscopy every 10 years starting at age 46.  Prostate cancer screening. Recommendations will vary depending on your family history and other risks.  Hepatitis C blood test.  Hepatitis B blood test.  Sexually transmitted disease (STD) testing.  Diabetes screening. This is done by checking your blood sugar (glucose) after you have not eaten for a while (fasting). You may have this done every 1-3 years. Discuss your test results, treatment options, and if necessary, the need  for more tests with your health care provider. Vaccines  Your health care provider may recommend certain  vaccines, such as:  Influenza vaccine. This is recommended every year.  Tetanus, diphtheria, and acellular pertussis (Tdap, Td) vaccine. You may need a Td booster every 10 years.  Zoster vaccine. You may need this after age 22.  Pneumococcal 13-valent conjugate (PCV13) vaccine. You may need this if you have certain conditions and have not been vaccinated.  Pneumococcal polysaccharide (PPSV23) vaccine. You may need one or two doses if you smoke cigarettes or if you have certain conditions. Talk to your health care provider about which screenings and vaccines you need and how often you need them. This information is not intended to replace advice given to you by your health care provider. Make sure you discuss any questions you have with your health care provider. Document Released: 08/24/2015 Document Revised: 04/16/2016 Document Reviewed: 05/29/2015 Elsevier Interactive Patient Education  2017 [place] Prevention in the Home Falls can cause injuries. They can happen to people of all ages. There are many things you can do to make your home safe and to help prevent falls. What can I do on the outside of my home?  Regularly fix the edges of walkways and driveways and fix any cracks.  Remove anything that might make you trip as you walk through a door, such as a raised step or threshold.  Trim any bushes or trees on the path to your home.  Use bright outdoor lighting.  Clear any walking paths of anything that might make someone trip, such as rocks or tools.  Regularly check to see if handrails are loose or broken. Make sure that both sides of any steps have handrails.  Any raised decks and porches should have guardrails on the edges.  Have any leaves, snow, or ice cleared regularly.  Use sand or salt on walking paths during winter.  Clean up any spills in your garage right away. This includes oil or grease spills. What can I do in the bathroom?  Use night  lights.  Install grab bars by the toilet and in the tub and shower. Do not use towel bars as grab bars.  Use non-skid mats or decals in the tub or shower.  If you need to sit down in the shower, use a plastic, non-slip stool.  Keep the floor dry. Clean up any water that spills on the floor as soon as it happens.  Remove soap buildup in the tub or shower regularly.  Attach bath mats securely with double-sided non-slip rug tape.  Do not have throw rugs and other things on the floor that can make you trip. What can I do in the bedroom?  Use night lights.  Make sure that you have a light by your bed that is easy to reach.  Do not use any sheets or blankets that are too big for your bed. They should not hang down onto the floor.  Have a firm chair that has side arms. You can use this for support while you get dressed.  Do not have throw rugs and other things on the floor that can make you trip. What can I do in the kitchen?  Clean up any spills right away.  Avoid walking on wet floors.  Keep items that you use a lot in easy-to-reach places.  If you need to reach something above you, use a strong step stool that has a grab bar.  Keep electrical cords  out of the way.  Do not use floor polish or wax that makes floors slippery. If you must use wax, use non-skid floor wax.  Do not have throw rugs and other things on the floor that can make you trip. What can I do with my stairs?  Do not leave any items on the stairs.  Make sure that there are handrails on both sides of the stairs and use them. Fix handrails that are broken or loose. Make sure that handrails are as long as the stairways.  Check any carpeting to make sure that it is firmly attached to the stairs. Fix any carpet that is loose or worn.  Avoid having throw rugs at the top or bottom of the stairs. If you do have throw rugs, attach them to the floor with carpet tape.  Make sure that you have a light switch at the  top of the stairs and the bottom of the stairs. If you do not have them, ask someone to add them for you. What else can I do to help prevent falls?  Wear shoes that:  Do not have high heels.  Have rubber bottoms.  Are comfortable and fit you well.  Are closed at the toe. Do not wear sandals.  If you use a stepladder:  Make sure that it is fully opened. Do not climb a closed stepladder.  Make sure that both sides of the stepladder are locked into place.  Ask someone to hold it for you, if possible.  Clearly mark and make sure that you can see:  Any grab bars or handrails.  First and last steps.  Where the edge of each step is.  Use tools that help you move around (mobility aids) if they are needed. These include:  Canes.  Walkers.  Scooters.  Crutches.  Turn on the lights when you go into a dark area. Replace any light bulbs as soon as they burn out.  Set up your furniture so you have a clear path. Avoid moving your furniture around.  If any of your floors are uneven, fix them.  If there are any pets around you, be aware of where they are.  Review your medicines with your doctor. Some medicines can make you feel dizzy. This can increase your chance of falling. Ask your doctor what other things that you can do to help prevent falls. This information is not intended to replace advice given to you by your health care provider. Make sure you discuss any questions you have with your health care provider. Document Released: 05/24/2009 Document Revised: 01/03/2016 Document Reviewed: 09/01/2014 Elsevier Interactive Patient Education  2017 [employer].

## 2020-05-15 NOTE — Progress Notes (Signed)
I connected with [name] today by telephone and verified that I am speaking with the correct person using two identifiers. Location patient: home Location provider: work Persons participating in the virtual visit: Banjamin Stovall and Lisette Abu, LPN.   I discussed the limitations, risks, security and privacy concerns of performing an evaluation and management service by telephone and the availability of in person appointments. I also discussed with the patient that there may be a patient responsible charge related to this service. The patient expressed understanding and verbally consented to this telephonic visit.    Interactive audio and video telecommunications were attempted between this provider and patient, however failed, due to patient having technical difficulties OR patient did not have access to video capability.  We continued and completed visit with audio only.  Some vital signs may be absent or patient reported.   Time Spent with patient on telephone encounter: 30 minutes  Subjective:   [name] is a 64 y.o. male who presents for Medicare Annual/Subsequent preventive examination.  Review of Systems    NO ROS. Medicare Wellness Visit. Cardiac Risk Factors include: advanced age (>28mn, >>66women);dyslipidemia;family history of premature cardiovascular disease;hypertension;male gender     Objective:    There were no vitals filed for this visit. There is no height or weight on file to calculate BMI.  Advanced Directives 05/15/2020 06/20/2019 06/05/2019 06/04/2019 07/22/2018 09/10/2017 06/05/2016  Does Patient Have a Medical Advance Directive? No No Yes Yes No No No  Type of Advance Directive - - Healthcare Power of [place] Does patient want to make changes to medical advance directive? - Yes (MAU/Ambulatory/Procedural Areas - Information given) No - Patient declined - - - -  Copy of [place] Chart? - - No  - copy requested - - - -  Would patient like information on creating a medical advance directive? No - Patient declined - - - - - No - patient declined information    Current Medications (verified) Outpatient Encounter Medications as of 05/15/2020  Medication Sig   aspirin EC 81 MG tablet Take 1 tablet (81 mg total) by mouth daily.   atorvastatin (LIPITOR) 40 MG tablet Take 1 40 mg tablet by mouth daily   Blood Glucose Monitoring Suppl (ONE TOUCH ULTRA 2) w/Device KIT Use as directed to check blood sugars twice a day   diltiazem (CARDIZEM CD) 240 MG 24 hr capsule Take 1 capsule (240 mg total) by mouth daily.   glucose blood (ONETOUCH ULTRA) test strip 1 each by Other route 2 (two) times daily.   hydrochlorothiazide (HYDRODIURIL) 25 MG tablet TAKE 1 TABLET BY MOUTH  DAILY   insulin glargine, 2 Unit Dial, (TOUJEO MAX SOLOSTAR) 300 UNIT/ML Solostar Pen Inject 250 Units into the skin every morning.   Lancets (ONETOUCH ULTRASOFT) lancets 1 each by Other route 2 (two) times daily. Dx E11.9 and Z79.4   loratadine (CLARITIN) 10 MG tablet Take 10 mg by mouth daily.   losartan (COZAAR) 100 MG tablet TAKE 1 TABLET BY MOUTH  DAILY   pantoprazole (PROTONIX) 40 MG tablet TAKE 1 TABLET BY MOUTH  DAILY   spironolactone (ALDACTONE) 25 MG tablet Take 1 tablet (25 mg total) by mouth daily.   No facility-administered encounter medications on file as of 05/15/2020.    Allergies (verified) Cefepime, Metoprolol, and Myrbetriq [mirabegron]   History: Past Medical History:  Diagnosis Date   Allergic rhinitis    At risk  for sleep apnea    STOP-BANG= 5   SENT TO PCP 03-14-2014   Cataract    surgically removed bilateral   Condyloma acuminatum of penis    Diabetic neuropathy (HCC)    GERD (gastroesophageal reflux disease)    History of bladder cancer    s/p  turbt  2013/   transitional cell carcinoma--    History of condyloma acuminatum    PERINEAL AREA  W/ RECURRENCY   History of gout     Hyperlipidemia    Hypertension    Lower urinary tract symptoms (LUTS)    Productive cough    PVD (peripheral vascular disease) with claudication (HCC)    bilateral SFA disease-- right > left  and left tibial artery disease--  per duplex   Smokers' cough ([place])    Type 2 diabetes mellitus with insulin therapy ([place])    monitor by  dr ellsion   Wears dentures    upper   Past Surgical History:  Procedure Laterality Date   AXILLARY HIDRADENITIS EXCISION  1997   CARDIOVASCULAR STRESS TEST  07-24-2014  dr Kathlyn [place]   Low risk lexiscan nuclear study with apical thinning and small inferolateral wall infarct at mid & basal level , no ischemia/  normal LVF and wall motion , ef 59%   CATARACT EXTRACTION Left    CATARACT EXTRACTION W/ INTRAOCULAR LENS IMPLANT Right    CO2 LASER APPLICATION N/A 1/69/6789   Procedure: CO2 LASER APPLICATION,PENIS, GROIN, ANUS;  Surgeon: [name], MD;  Location: [facility];  Service: General;  Laterality: N/A;   CO2 LASER APPLICATION N/A 38/05/1750   Procedure: CO2 LASER APPLICATION;  Surgeon: Kathie Rhodes, MD;  Location: [place];  Service: Urology;  Laterality: N/A;   CONDYLOMA EXCISION/FULGURATION N/A 05/21/2015   Procedure: CONDYLOMA REMOVAL;  Surgeon: Kathie Rhodes, MD;  Location[facility];  Service: Urology;  Laterality: N/A;   HEMORRHOID SURGERY  10/24/2014   Procedure: HEMORRHOIDECTOMY;  Surgeon: Michael Boston, MD;  Location: [facility];  Service: General;;   INCISION AND DRAINAGE ABSCESS Left 10/24/2014   Procedure: INCISION AND DRAINAGE ABSCESS;  Surgeon: Michael Boston, MD;  Location: [place];  Service: General;  Laterality: Left;   INGUINAL HIDRADENITIS [place] N/A 03/20/2014   Procedure: EXAM UNDER ANESTHESIA, REMOVAL/ABLATION OF CONDYLOMATA PENIS,GROINS, ANUS, ANAL CANAL;  Surgeon: [name], MD;   Location: [place];  Service: General;  Laterality: N/A;  groin and anus   LASER ABLATION CONDOLAMATA N/A 10/24/2014   Procedure: LASER ABLATION CONDOLAMATA;  Surgeon: Michael Boston, MD;  Location: [place];  Service: General;  Laterality: N/A;   LASER ABLATION OF PENILE AND PERIANAL WARTS  07-29-2007  Dr. Johney [place]   LEFT SHOULDER SURGERY  2003   MASS EXCISION N/A 10/24/2014   Procedure: EXCISION OF PERINEAL MASS/SINUS;  Surgeon: Michael Boston, MD;  Location: [place];  Service: General;  Laterality: N/A;   MOHS SURGERY     back   MOHS SURGERY  2017   face   MULTIPLE TOOTH EXTRACTIONS     PERINEAL HIDRADENITIS [place] TUMOR  05/21/2012   Procedure: TRANSURETHRAL RESECTION OF BLADDER TUMOR (TURBT);  Surgeon: Claybon Jabs, MD;  Location: [facility];  Service: Urology;  Laterality: N/A;      Family History  Problem Relation Age of Onset  Hypertension Mother    Cancer Father        lung ca   Diabetes Maternal Aunt        x 2   Colon cancer Neg Hx    Esophageal cancer Neg Hx    Pancreatic cancer Neg Hx    Prostate cancer Neg Hx    Kidney disease Neg Hx    Liver disease Neg Hx    Lung cancer Neg Hx    Rectal cancer Neg Hx    Stomach cancer Neg Hx    Social History   Socioeconomic History   Marital status: Married    Spouse name: Not on file   Number of children: Not on file   Years of education: Not on file   Highest education level: Not on file  Occupational History   Occupation: Disabled  Tobacco Use   Smoking status: Former Smoker    Packs/day: 1.00    Years: 41.00    Pack years: 41.00    Types: Cigars, Cigarettes    Quit date: 04/11/2016    Years since quitting: 4.0   Smokeless tobacco: Never Used  Vaping Use   Vaping Use: Former  Substance and Sexual Activity   Alcohol use: No    Alcohol/week: 0.0 standard drinks    Drug use: No   Sexual activity: Not on file  Other Topics Concern   Not on file  Social History Narrative   Not on file   Social Determinants of Health   Financial Resource Strain: Low Risk    Difficulty of Paying Living Expenses: Not hard at all  Food Insecurity: No Food Insecurity   Worried About Charity fundraiser in the Last Year: Never true   Humble in the Last Year: Never true  Transportation Needs: No Transportation Needs   Lack of Transportation (Medical): No   Lack of Transportation (Non-Medical): No  Physical Activity: Inactive   Days of Exercise per Week: 0 days   Minutes of Exercise per Session: 0 min  Stress: No Stress Concern Present   Feeling of Stress : Not at all  Social Connections: Moderately Isolated   Frequency of Communication with Friends and Family: More than three times a week   Frequency of Social Gatherings with Friends and Family: Never   Attends Religious Services: Never   Marine scientist or Organizations: No   Attends Music therapist: Never   Marital Status: Married    Tobacco Counseling Counseling given: Not Answered   Clinical Intake:  Pre-visit preparation completed: Yes  Pain : No/denies pain     Nutritional Risks: None Diabetes: Yes CBG done?: No Did pt. bring in CBG monitor from home?: No  How often do you need to have someone help you when you read instructions, pamphlets, or other written materials from your doctor or pharmacy?: 1 - Never What is the last grade level you completed in school?: 11th grade  Diabetic? yes  Interpreter Needed?: No  Information entered by :: Lisette Abu, LPN   Activities of Daily Living In your present state of health, do you have any difficulty performing the following activities: 05/15/2020 06/05/2019  Hearing? N N  Vision? N N  Difficulty concentrating or making decisions? N N  Walking or climbing stairs? N N  Dressing or bathing? N N   Doing errands, shopping? N N  Preparing Food and eating ? N -  Using the Toilet? N -  In the past  six months, have you accidently leaked urine? N -  Do you have problems with loss of bowel control? N -  Managing your Medications? N -  Managing your Finances? N -  Housekeeping or managing your Housekeeping? N -  Some recent data might be hidden    Patient Care Team: Biagio Borg, MD as PCP - General (Internal Medicine) Wellington Hampshire, MD as PCP - Cardiology (Cardiology) Kathie Rhodes, MD as Consulting Physician (Urology) Wellington Hampshire, MD as Consulting Physician (Cardiology) Michael Boston, MD as Consulting Physician (General Surgery) Lynnell Dike, [place] (Optometry) [name], MD as Consulting Physician (Endocrinology)  Indicate any recent Medical Services you may have received from other than [facility] providers in the past year (date may be approximate).     Assessment:   This is a routine wellness examination for [facility].  Hearing/Vision screen No exam data present  Dietary issues and exercise activities discussed: Current Exercise Habits: The patient does not participate in regular exercise at present, Exercise limited by: None identified  Goals   None    Depression Screen PHQ 2/9 Scores 05/15/2020 10/20/2019 06/20/2019 04/21/2019 06/15/2018  PHQ - 2 Score 0 1 1 0 0    Fall Risk Fall Risk  05/15/2020 04/26/2020 10/20/2019 06/20/2019 04/21/2019  Falls in the past year? 0 0 0 0 0  Number falls in past yr: 0 0 - - -  Injury with Fall? 0 0 - - -  Risk for fall due to : History of fall(s) History of fall(s) - - -  Follow up Falls evaluation completed Falls evaluation completed - - -    Any stairs in or around the home? No  If so, are there any without handrails? No  Home free of loose throw rugs in walkways, pet beds, electrical cords, etc? Yes  Adequate lighting in your home to reduce risk of falls? Yes   ASSISTIVE DEVICES UTILIZED TO PREVENT FALLS:  Life alert? No    Use of a cane, walker or w/c? No  Grab bars in the bathroom? No  Shower chair or bench in shower? No  Elevated toilet seat or a handicapped toilet? No   TIMED UP AND GO:  Was the test performed? No .  Length of time to ambulate 10 feet: 0 sec.   Gait steady and fast without use of assistive device  Cognitive Function: not indicated        Immunizations Immunization History  Administered Date(s) Administered   Fluad Quad(high Dose 65+) 04/21/2019   Influenza Split 07/07/2011, 06/08/2012   Influenza Whole 05/04/2008, 05/22/2009, 05/02/2010   Influenza,inj,Quad PF,6+ Mos 06/06/2013, 05/05/2014, 07/02/2015, 05/05/2016, 06/15/2018, 04/26/2020   PFIZER SARS-COV-2 Vaccination 11/23/2019, 12/14/2019   Pneumococcal Polysaccharide-23 01/10/1996, 05/05/2016   Td 02/09/2000   Tdap 02/10/2011, 06/05/2016    TDAP status: Up to date Flu Vaccine status: Up to date Pneumococcal vaccine status: Up to date Covid-19 vaccine status: Completed vaccines  Qualifies for Shingles Vaccine? Yes   Zostavax completed No   Shingrix Completed?: No.    Education has been provided regarding the importance of this vaccine. Patient has been advised to call insurance company to determine out of pocket expense if they have not yet received this vaccine. Advised may also receive vaccine at local pharmacy or Health Dept. Verbalized acceptance and understanding.  Screening Tests Health Maintenance  Topic Date Due   OPHTHALMOLOGY EXAM  05/12/2019   HEMOGLOBIN A1C  10/17/2020   FOOT EXAM  04/19/2021   COLONOSCOPY  07/22/2021   TETANUS/TDAP  06/05/2026   INFLUENZA VACCINE  Completed   PNEUMOCOCCAL POLYSACCHARIDE VACCINE AGE 57-64 HIGH RISK  Completed   COVID-19 Vaccine  Completed   Hepatitis C Screening  Completed   HIV Screening  Completed    Health Maintenance  Health Maintenance Due  Topic Date Due   OPHTHALMOLOGY EXAM  05/12/2019    Colorectal cancer screening: Completed  07/22/2018. Repeat every 3 years  Lung Cancer Screening: (Low Dose CT Chest recommended if Age 48-80 years, 30 pack-year currently smoking OR have quit w/in 15years.) does not qualify.   Lung Cancer Screening Referral: no  Additional Screening:  Hepatitis C Screening: does qualify; Completed yes  Vision Screening: Recommended annual ophthalmology exams for early detection of glaucoma and other disorders of the eye. Is the patient up to date with their annual eye exam?  Yes  Who is the provider or what is the name of the office in which the patient attends annual eye exams? [facility] If pt is not established with a provider, would they like to be referred to a provider to establish care? No .   Dental Screening: Recommended annual dental exams for proper oral hygiene  Community Resource Referral / Chronic Care Management: CRR required this visit?  No   CCM required this visit?  No      Plan:     I have personally reviewed and noted the following in the patients chart:    Medical and social history  Use of alcohol, tobacco or illicit drugs   Current medications and supplements  Functional ability and status  Nutritional status  Physical activity  Advanced directives  List of other physicians  Hospitalizations, surgeries, and ER visits in previous 12 months  Vitals  Screenings to include cognitive, depression, and falls  Referrals and appointments  In addition, I have reviewed and discussed with patient certain preventive protocols, quality metrics, and best practice recommendations. A written personalized care plan for preventive services as well as general preventive health recommendations were provided to patient.     Sheral Flow, LPN   [id number]/0/6349   Nurse Notes:  Patient is cogitatively intact. There were no vitals filed for this visit. There is no height or weight on file to calculate BMI. Patient stated that he has no issues with  gait or balance; does not use any assistive devices.

## 2020-05-21 DIAGNOSIS — Z8551 Personal history of malignant neoplasm of bladder: Secondary | ICD-10-CM | POA: Diagnosis not present

## 2020-05-21 DIAGNOSIS — R3912 Poor urinary stream: Secondary | ICD-10-CM | POA: Diagnosis not present

## 2020-06-06 ENCOUNTER — Encounter: Payer: Self-pay | Admitting: Nurse Practitioner

## 2020-06-06 ENCOUNTER — Other Ambulatory Visit: Payer: Self-pay

## 2020-06-06 ENCOUNTER — Telehealth (INDEPENDENT_AMBULATORY_CARE_PROVIDER_SITE_OTHER): Payer: Medicare Other | Admitting: Nurse Practitioner

## 2020-06-06 ENCOUNTER — Telehealth: Payer: Self-pay | Admitting: Nurse Practitioner

## 2020-06-06 VITALS — BP 159/72 | HR 72 | Temp 98.0°F | Ht 68.0 in | Wt 255.0 lb

## 2020-06-06 DIAGNOSIS — I739 Peripheral vascular disease, unspecified: Secondary | ICD-10-CM

## 2020-06-06 DIAGNOSIS — I10 Essential (primary) hypertension: Secondary | ICD-10-CM

## 2020-06-06 DIAGNOSIS — I471 Supraventricular tachycardia: Secondary | ICD-10-CM

## 2020-06-06 NOTE — Progress Notes (Signed)
Virtual Visit via Telephone Note   This visit type was conducted due to national recommendations for restrictions regarding the COVID-19 Pandemic (e.g. social distancing) in an effort to limit this patient's exposure and mitigate transmission in our community.  Due to his co-morbid illnesses, this patient is at least at moderate risk for complications without adequate follow up.  This format is felt to be most appropriate for this patient at this time.  The patient did not have access to video technology/had technical difficulties with video requiring transitioning to audio format only (telephone).  All issues noted in this document were discussed and addressed.  No physical exam could be performed with this format.  Please refer to the patient's chart for his  consent to telehealth for [facility]. Evaluation Performed:  Follow-up visit  This visit type was conducted due to national recommendations for restrictions regarding the COVID-19 Pandemic (e.g. social distancing).  This format is felt to be most appropriate for this patient at this time.  All issues noted in this document were discussed and addressed.  No physical exam was performed (except for noted visual exam findings with Video Visits).  Please refer to the patient's chart (MyChart message for video visits and phone note for telephone visits) for the patient's consent to telehealth for [facility]. _____________   Date:  06/06/2020   Patient ID:  [name], [name] [birth year], MRN [id number] Patient Location:  Home Provider location:   Office  Primary Care Provider:  Biagio Borg, MD Primary Cardiologist:  Kathlyn Sacramento, MD  Chief Complaint    64 year old male with a history of peripheral arterial disease, nonobstructive CAD, hypertension, hyperlipidemia, PSVT, insulin-dependent diabetes mellitus, tobacco abuse (quit 2017), and paroxysmal atrial fibrillation (less than 1% on monitoring November 2020), who presents for  follow-up related to hypertension.  Past Medical History    Past Medical History:  Diagnosis Date  . Allergic rhinitis   . At risk for sleep apnea    STOP-BANG= 5   SENT TO PCP 03-14-2014  . CAD (coronary artery disease)    a. 07/2014 low risk MV; b. 07/2019 Cor CTA (FFR): LM 25-49 (nl), LAD mild prox/mid plaque (nl), D1 25-49p(nl w/ abnl FFR of 0.73 in inf branch), LCX/OM1 mild prox/mid plaque (nl), RCA nondominant, minimal Ca2+ plaque (nl), RPDA (mildly abnl @ 0.79)-->Med Rx..  . Cataract    surgically removed bilateral  . Condyloma acuminatum of penis   . Diabetic neuropathy ([place])   . Diastolic dysfunction    a. 05/2019 Echo: EF 60-65%, no rwam, mod LVH, impaired relaxation, nl RV size/fxn, trace MR, triv TR.  Marland Kitchen GERD (gastroesophageal reflux disease)   . History of bladder cancer    s/p  turbt  2013/   transitional cell carcinoma--   . History of condyloma acuminatum    PERINEAL AREA  W/ RECURRENCY  . History of gout   . Hyperlipidemia   . Hypertension   . Lower urinary tract symptoms (LUTS)   . PAF (paroxysmal atrial fibrillation) ([place])    a. 06/2019 Event monitor: PAF <1% burden. Longest 3 mins 36 secs.  . Productive cough   . PSVT (paroxysmal supraventricular tachycardia) ([place])    a. 06/2019 Event monitor: 112 episodes of SVT, longest 21 secs.  Marland Kitchen PVD (peripheral vascular disease) with claudication ([place])    a. 03/2014 LE art duplex: long segment occlusion of mid to distal R SFA; b. 03/2020 ABI: nl left and mildly improved R ABI->med rx.  Marland Kitchen  Smokers' cough ([place])   . Type 2 diabetes mellitus with insulin therapy ([place]) 1992   monitor by  dr ellsion  . Wears dentures    upper   Past Surgical History:  Procedure Laterality Date  . AXILLARY HIDRADENITIS EXCISION  1997  . CARDIOVASCULAR STRESS TEST  07-24-2014  dr Kathlyn Sacramento   Low risk lexiscan nuclear study with apical thinning and small inferolateral wall infarct at mid & basal level , no ischemia/  normal LVF and wall  motion , ef 59%  . CATARACT EXTRACTION Left   . CATARACT EXTRACTION W/ INTRAOCULAR LENS IMPLANT Right   . CO2 LASER APPLICATION N/A 0/25/8527   Procedure: CO2 LASER APPLICATION,PENIS, GROIN, ANUS;  Surgeon: [name], MD;  Location: [facility];  Service: General;  Laterality: N/A;  . CO2 LASER APPLICATION N/A 78/24/2353   Procedure: CO2 LASER APPLICATION;  Surgeon: Kathie Rhodes, MD;  Location: [facility];  Service: Urology;  Laterality: N/A;  . CONDYLOMA EXCISION/FULGURATION N/A 05/21/2015   Procedure: CONDYLOMA REMOVAL;  Surgeon: Kathie Rhodes, MD;  Location: [facility];  Service: Urology;  Laterality: N/A;  . HEMORRHOID SURGERY  10/24/2014   Procedure: HEMORRHOIDECTOMY;  Surgeon: Michael Boston, MD;  Location: [facility];  Service: General;;  . INCISION AND DRAINAGE ABSCESS Left 10/24/2014   Procedure: INCISION AND DRAINAGE ABSCESS;  Surgeon: Michael Boston, MD;  Location: [facility];  Service: General;  Laterality: Left;  . INGUINAL HIDRADENITIS EXCISION  1998, 1999  . LASER ABLATION CONDOLAMATA N/A 03/20/2014   Procedure: EXAM UNDER ANESTHESIA, REMOVAL/ABLATION OF CONDYLOMATA PENIS,GROINS, ANUS, ANAL CANAL;  Surgeon: [name], MD;  Location: [facility];  Service: General;  Laterality: N/A;  groin and anus  . LASER ABLATION CONDOLAMATA N/A 10/24/2014   Procedure: LASER ABLATION CONDOLAMATA;  Surgeon: Michael Boston, MD;  Location: [facility];  Service: General;  Laterality: N/A;  . LASER ABLATION OF PENILE AND PERIANAL WARTS  07-29-2007  Dr. Johney Maine  . LEFT SHOULDER SURGERY  2003  . MASS EXCISION N/A 10/24/2014   Procedure: EXCISION OF PERINEAL MASS/SINUS;  Surgeon: Michael Boston, MD;  Location: [facility];  Service: General;  Laterality: N/A;  . MOHS SURGERY     back  . MOHS SURGERY  2017   face  . MULTIPLE TOOTH EXTRACTIONS    . PERINEAL HIDRADENITIS EXCISION   1998, 1999  . TRANSURETHRAL RESECTION OF BLADDER TUMOR  05/21/2012   Procedure: TRANSURETHRAL RESECTION OF BLADDER TUMOR (TURBT);  Surgeon: Claybon Jabs, MD;  Location[facility];  Service: Urology;  Laterality: N/A;       Allergies  Allergies  Allergen Reactions  . Cefepime Hives    Had allergic reaction to one of these 3 agents, unclear which one  . Metoprolol Hives    Had allergic reaction to one of these 3 agents, unclear which one  *Per RN, highly likely this is the cause of allergic rxn  . Myrbetriq [Mirabegron] Hives    Had allergic reaction to one of these 3 agents, unclear which one    History of Present Illness    [name] is a 64 y.o. male who presents via audio/video conferencing for a telehealth visit today.  As above, he has a history of peripheral arterial disease, nonobstructive CAD, hypertension, hyperlipidemia, PSVT, insulin-dependent diabetes mellitus, tobacco abuse (quit 2017), and paroxysmal atrial fibrillation.  In the setting of right calf claudication, he  underwent noninvasive evaluation in August 2015 showing an ABI of 0.44 on the right and normal ABI on the left.  Duplex showed long occlusion of the right SFA from the mid to distal segment with reconstitution in the popliteal artery.  There was no significant left SFA stenosis and he was medically managed.  He also has a history of chest pain with low risk stress testing in December 2015.  More recently, in October 2020, he was admitted with sepsis secondary to UTI and pyelonephritis, as well as hypertensive urgency.  Troponin peaked at 741.  CT of the chest was negative for PE but did show coronary calcium.  Echo showed an EF of 60-65% with moderate LVH and no significant valvular abnormalities.  During hospitalization, he was noted to have short runs of SVT in the setting of electrolyte abnormalities.  Following discharge, cardiac monitoring was performed and showed 112 episodes of SVT as well  as paroxysmal A. fib of less than 1% burden.  His amlodipine was switched to diltiazem.  Also, in light of troponin elevation and coronary calcium noted during hospitalization, he underwent coronary CT angiography in December 2020, showing a calcium score 412.  Angiography showed mild, nonobstructive disease with mildly abnormal FFR in the inferior branch of the D1 and RPDA.  He has been medically managed.  He was last seen in cardiology clinic in July of this year, at which time his blood pressure was elevated.  He also reported worsening bilateral leg claudication with numbness at rest, especially on the right.  Spironolactone therapy was added to his regimen and labs were stable the following week.  ABIs were performed and were stable on the right at 0.54 and normal on the left at 1.03.  Since his last visit, he has had stable right greater than left lower extremity claudication occurring after walking about 100-150 feet.  He experiences dyspnea on exertion at a similar distance.  He has not been experiencing any chest pain.  His blood pressure was trending in the 160s and his primary care provider recently increased his diltiazem to 240 mg daily.  He has been on this dose for about 2 weeks.  Pressure is elevated today at 159/72.  He recently had a urologic procedure and went on prophylactic antibiotics post procedure.  Over the past 6 days, he has been experiencing diarrhea which seems to be improving.  He denies palpitations, PND, orthopnea, dizziness, syncope, edema, or early satiety  The patient does not have symptoms concerning for COVID-19 infection (fever, chills, cough, or new shortness of breath).   Home Medications    Prior to Admission medications   Medication Sig Start Date End Date Taking? Authorizing Provider  aspirin EC 81 MG tablet Take 1 tablet (81 mg total) by mouth daily. 11/25/16   Wellington Hampshire, MD  atorvastatin (LIPITOR) 40 MG tablet Take 1 40 mg tablet by mouth daily 09/23/19    Biagio Borg, MD  Blood Glucose Monitoring Suppl (ONE TOUCH ULTRA 2) w/Device KIT Use as directed to check blood sugars twice a day 11/25/19   Biagio Borg, MD  diltiazem (CARDIZEM CD) 240 MG 24 hr capsule Take 1 capsule (240 mg total) by mouth daily. 04/26/20   Biagio Borg, MD  glucose blood (ONETOUCH ULTRA) test strip 1 each by Other route 2 (two) times daily. 11/25/19   Biagio Borg, MD  hydrochlorothiazide (HYDRODIURIL) 25 MG tablet TAKE 1 TABLET BY MOUTH  DAILY 03/02/20   Cathlean Cower  W, MD  insulin glargine, 2 Unit Dial, (TOUJEO MAX SOLOSTAR) 300 UNIT/ML Solostar Pen Inject 250 Units into the skin every morning. 05/08/20   [name], MD  Lancets [facility] ULTRASOFT) lancets 1 each by Other route 2 (two) times daily. Dx E11.9 and Z79.4 11/25/19   Biagio Borg, MD  loratadine (CLARITIN) 10 MG tablet Take 10 mg by mouth daily.    [provider]  losartan (COZAAR) 100 MG tablet TAKE 1 TABLET BY MOUTH  DAILY 03/02/20   Biagio Borg, MD  pantoprazole (PROTONIX) 40 MG tablet TAKE 1 TABLET BY MOUTH  DAILY 04/24/20   Biagio Borg, MD  spironolactone (ALDACTONE) 25 MG tablet Take 1 tablet (25 mg total) by mouth daily. 02/23/20 05/23/20  Wellington Hampshire, MD    Review of Systems    Chronic, stable dyspnea on exertion and lower extremity claudication.  Recent diarrhea which is improving.  He denies chest pain, palpitations, PND, orthopnea, dizziness, syncope, edema, or early satiety.  All other systems reviewed and are otherwise negative except as noted above.  Physical Exam    Vital Signs:  BP (!) 159/72 (BP Location: Right Arm, Patient Position: Sitting)   Pulse 72   Temp 98 F (36.7 C)   Ht $R'5\' 8"'iQ$  (1.727 m)   Wt 255 lb (115.7 kg)   SpO2 96%   BMI 38.77 kg/m    Exam limited by telephonic nature of visit.  Awake alert and oriented x3.  Speech is unlabored.  Accessory Clinical Findings    None  Assessment & Plan    1.  Peripheral arterial disease with right greater than  left lower extremity claudication: Known long segment occlusion of the right SFA dating back to 2015 with recent stable ABIs.  He has stable, chronic claudication after walking 100 to 150 feet.  He remains on aspirin and statin therapy.  Continue regular walking encouraged.  He will have follow-up ABIs next year.  2.  Essential hypertension: Blood pressure elevated today.  He has been trending in the 160s at home and recently had his diltiazem dose increased to 240 mg daily.  We discussed likely need for further titration to 300 mg.  Patient prefers to hold off and will continue to follow pressures for another 2 weeks and contact us if he continues to trend greater than 700 systolic.  Continue losartan and spironolactone at current doses.  3.  PSVT: Previously noted on outpatient monitoring.  Quiescent.  Continue diltiazem therapy.  4.  Paroxysmal atrial fibrillation: Less than 1% A. fib burden noted on monitoring in late 2020.  As above, no recent palpitations.  He is not on anticoagulation secondary to significantly low burden of A. fib.  5.  Nonobstructive CAD/chest pain: No recent chest pain.  Predominantly nonobstructive CAD by coronary CTA in December 2020.  He did have abnormal FFR of the inferior branch of the D1 and the RPDA.  He does have dyspnea on exertion which is likely secondary to deconditioning in light of significant weight gain over the past 8 years.  Encouraged more regular activity/exercise.  Continue medical therapy with aspirin and statin.  6.  Insulin-dependent diabetes mellitus: Remains poorly controlled with an A1c of 10.7.  Followed closely by primary care and endocrinology.  7.  Diarrhea: This is been going on for 6 days in the setting of recent antibiotics.  Seems to be getting better.  I encouraged him to reach out to his primary care provider if symptoms do  not resolve within the next 2 to 3 days given potential risk of C. difficile.  8.  Disposition: Follow-up in clinic  in 4 months or sooner if necessary.  COVID-19 Education: The signs and symptoms of COVID-19 were discussed with the patient and how to seek care for testing (follow up with PCP or arrange E-visit).  The importance of social distancing was discussed today.  Patient Risk:   After full review of this patient's history and clinical status, I feel that he is at least moderate risk for cardiac complications at this time, thus necessitating a telehealth visit sooner than our first available in office visit.  Time:   Today, I have spent 19 minutes with the patient with telehealth technology discussing medical history, symptoms, and management plan.     Murray Hodgkins, NP 06/06/2020, 3:11 PM

## 2020-06-06 NOTE — Patient Instructions (Signed)
Medication Instructions:   1. Your physician recommends that you continue on your current medications as directed. Please refer to the Current Medication list given to you today.  *If you need a refill on your cardiac medications before your next appointment, please call your pharmacy*   Lab Work:  1. None Ordered  If you have labs (blood work) drawn today and your tests are completely normal, you will receive your results only by: Marland Kitchen MyChart Message (if you have MyChart) OR . A paper copy in the mail If you have any lab test that is abnormal or we need to change your treatment, we will call you to review the results.   Testing/Procedures:  1. None Ordered   Follow-Up: At [facility], you and your health needs are our priority.  As part of our continuing mission to provide you with exceptional heart care, we have created designated Provider Care Teams.  These Care Teams include your primary Cardiologist (physician) and Advanced Practice Providers (APPs -  Physician Assistants and Nurse Practitioners) who all work together to provide you with the care you need, when you need it.  We recommend signing up for the patient portal called "MyChart".  Sign up information is provided on this After Visit Summary.  MyChart is used to connect with patients for Virtual Visits (Telemedicine).  Patients are able to view lab/test results, encounter notes, upcoming appointments, etc.  Non-urgent messages can be sent to your provider as well.   To learn more about what you can do with MyChart, go to [email or web address].    Your next appointment:   4 month(s)  The format for your next appointment:   In Person  Provider:   Kathlyn Sacramento, MD

## 2020-06-06 NOTE — Telephone Encounter (Signed)
  Patient Consent for Virtual Visit         [name] has provided verbal consent on 06/06/2020 for a virtual visit (video or telephone).   CONSENT FOR VIRTUAL VISIT FOR:  [name]  By participating in this virtual visit I agree to the following:  I hereby voluntarily request, consent and authorize [facility] and its employed or contracted physicians, physician assistants, nurse practitioners or other licensed health care professionals (the Practitioner), to provide me with telemedicine health care services (the "Services") as deemed necessary by the treating Practitioner. I acknowledge and consent to receive the Services by the Practitioner via telemedicine. I understand that the telemedicine visit will involve communicating with the Practitioner through live audiovisual communication technology and the disclosure of certain medical information by electronic transmission. I acknowledge that I have been given the opportunity to request an in-person assessment or other available alternative prior to the telemedicine visit and am voluntarily participating in the telemedicine visit.  I understand that I have the right to withhold or withdraw my consent to the use of telemedicine in the course of my care at any time, without affecting my right to future care or treatment, and that the Practitioner or I may terminate the telemedicine visit at any time. I understand that I have the right to inspect all information obtained and/or recorded in the course of the telemedicine visit and may receive copies of available information for a reasonable fee.  I understand that some of the potential risks of receiving the Services via telemedicine include:  Marland Kitchen Delay or interruption in medical evaluation due to technological equipment failure or disruption; . Information transmitted may not be sufficient (e.g. poor resolution of images) to allow for appropriate medical decision making by the Practitioner;  and/or  . In rare instances, security protocols could fail, causing a breach of personal health information.  Furthermore, I acknowledge that it is my responsibility to provide information about my medical history, conditions and care that is complete and accurate to the best of my ability. I acknowledge that Practitioner's advice, recommendations, and/or decision may be based on factors not within their control, such as incomplete or inaccurate data provided by me or distortions of diagnostic images or specimens that may result from electronic transmissions. I understand that the practice of medicine is not an exact science and that Practitioner makes no warranties or guarantees regarding treatment outcomes. I acknowledge that a copy of this consent can be made available to me via my patient portal ([place]), or I can request a printed copy by calling the office of [facility].    I understand that my insurance will be billed for this visit.   I have read or had this consent read to me. . I understand the contents of this consent, which adequately explains the benefits and risks of the Services being provided via telemedicine.  . I have been provided ample opportunity to ask questions regarding this consent and the Services and have had my questions answered to my satisfaction. . I give my informed consent for the services to be provided through the use of telemedicine in my medical care

## 2020-06-07 ENCOUNTER — Ambulatory Visit: Payer: Medicare Other | Admitting: Cardiovascular Disease

## 2020-06-20 ENCOUNTER — Ambulatory Visit: Payer: Medicare Other | Admitting: Endocrinology

## 2020-06-22 ENCOUNTER — Other Ambulatory Visit: Payer: Self-pay

## 2020-06-22 ENCOUNTER — Ambulatory Visit (INDEPENDENT_AMBULATORY_CARE_PROVIDER_SITE_OTHER): Payer: Medicare Other | Admitting: Endocrinology

## 2020-06-22 VITALS — BP 158/70 | HR 68 | Ht 68.0 in | Wt 254.8 lb

## 2020-06-22 DIAGNOSIS — E1151 Type 2 diabetes mellitus with diabetic peripheral angiopathy without gangrene: Secondary | ICD-10-CM | POA: Diagnosis not present

## 2020-06-22 DIAGNOSIS — Z794 Long term (current) use of insulin: Secondary | ICD-10-CM

## 2020-06-22 LAB — POCT GLYCOSYLATED HEMOGLOBIN (HGB A1C): Hemoglobin A1C: 9.3 % — AB (ref 4.0–5.6)

## 2020-06-22 MED ORDER — TOUJEO MAX SOLOSTAR 300 UNIT/ML ~~LOC~~ SOPN: 270.0000 [IU] | PEN_INJECTOR | SUBCUTANEOUS | 3 refills | Status: DC

## 2020-06-22 NOTE — Progress Notes (Signed)
Subjective:    Patient ID: [name], male    DOB: 09-Dec-1955, 64 y.o.   MRN: [id number]  HPI Pt returns for f/u of diabetes mellitus: DM type: Insulin-requiring type 2.   Dx'ed: 5830 Complications: PAD, CRI, and PN.   Therapy: insulin since 2009.   DKA: never Severe hypoglycemia: never.   Pancreatitis: never.  SDOH: he could not afford Trulicity.   Other: he takes a QD insulin regimen, after poor results with more frequent injections; he was changed to 70/30, due to pattern of cbg's, but he did poorly on this, too.   Interval history: Meter is downloaded today, and the printout is scanned into the record.  cbg's vary from 95-450.  It is in general higher as the day goes on.   Past Medical History:  Diagnosis Date  . Allergic rhinitis   . At risk for sleep apnea    STOP-BANG= 5   SENT TO PCP 03-14-2014  . CAD (coronary artery disease)    a. 07/2014 low risk MV; b. 07/2019 Cor CTA (FFR): LM 25-49 (nl), LAD mild prox/mid plaque (nl), D1 25-49p(nl w/ abnl FFR of 0.73 in inf branch), LCX/OM1 mild prox/mid plaque (nl), RCA nondominant, minimal Ca2+ plaque (nl), RPDA (mildly abnl @ 0.79)-->Med Rx..  . Cataract    surgically removed bilateral  . Condyloma acuminatum of penis   . Diabetic neuropathy ([place])   . Diastolic dysfunction    a. 05/2019 Echo: EF 60-65%, no rwam, mod LVH, impaired relaxation, nl RV size/fxn, trace MR, triv TR.  Marland Kitchen GERD (gastroesophageal reflux disease)   . History of bladder cancer    s/p  turbt  2013/   transitional cell carcinoma--   . History of condyloma acuminatum    PERINEAL AREA  W/ RECURRENCY  . History of gout   . Hyperlipidemia   . Hypertension   . Lower urinary tract symptoms (LUTS)   . PAF (paroxysmal atrial fibrillation) ([place])    a. 06/2019 Event monitor: PAF <1% burden. Longest 3 mins 36 secs.  . Productive cough   . PSVT (paroxysmal supraventricular tachycardia) ([place])    a. 06/2019 Event monitor: 112 episodes of SVT, longest 21 secs.  Marland Kitchen  PVD (peripheral vascular disease) with claudication ([place])    a. 03/2014 LE art duplex: long segment occlusion of mid to distal R SFA; b. 03/2020 ABI: nl left and mildly improved R ABI->med rx.  . Smokers' cough ([place])   . Type 2 diabetes mellitus with insulin therapy ([place]) 1992   monitor by  dr ellsion  . Wears dentures    upper    Past Surgical History:  Procedure Laterality Date  . AXILLARY HIDRADENITIS EXCISION  1997  . CARDIOVASCULAR STRESS TEST  07-24-2014  dr Kathlyn [place]   Low risk lexiscan nuclear study with apical thinning and small inferolateral wall infarct at mid & basal level , no ischemia/  normal LVF and wall motion , ef 59%  . CATARACT EXTRACTION Left   . CATARACT EXTRACTION W/ INTRAOCULAR LENS IMPLANT Right   . CO2 LASER APPLICATION N/A 9/40/7680   Procedure: CO2 LASER APPLICATION,PENIS, GROIN, ANUS;  Surgeon: [name], MD;  Location: [place];  Service: General;  Laterality: N/A;  . CO2 LASER APPLICATION N/A 88/06/314   Procedure: CO2 LASER APPLICATION;  Surgeon: Kathie Rhodes, MD;  Location: [facility];  Service: Urology;  Laterality: N/A;  . CONDYLOMA EXCISION/FULGURATION N/A 05/21/2015   Procedure: CONDYLOMA REMOVAL;  Surgeon: Elta Guadeloupe  Karsten Ro, MD;  [facility];  Service: Urology;  Laterality: N/A;  . HEMORRHOID SURGERY  10/24/2014   Procedure: HEMORRHOIDECTOMY;  Surgeon: Michael Boston, MD;  Location: [facility];  Service: General;;  . INCISION AND DRAINAGE ABSCESS Left 10/24/2014   Procedure: INCISION AND DRAINAGE ABSCESS;  Surgeon: Michael Boston, MD;  Location: [place];  Service: General;  Laterality: Left;  . INGUINAL HIDRADENITIS EXCISION  1998, 1999  . LASER ABLATION CONDOLAMATA N/A 03/20/2014   Procedure: EXAM UNDER ANESTHESIA, REMOVAL/ABLATION OF CONDYLOMATA PENIS,GROINS, ANUS, ANAL CANAL;  Surgeon: [name], MD;  Location: [place];  Service:  General;  Laterality: N/A;  groin and anus  . LASER ABLATION CONDOLAMATA N/A 10/24/2014   Procedure: LASER ABLATION CONDOLAMATA;  Surgeon: Michael Boston, MD;  Location: [facility];  Service: General;  Laterality: N/A;  . LASER ABLATION OF PENILE AND PERIANAL WARTS  07-29-2007  Dr. Johney Maine  . LEFT SHOULDER SURGERY  2003  . MASS EXCISION N/A 10/24/2014   Procedure: EXCISION OF PERINEAL MASS/SINUS;  Surgeon: Michael Boston, MD;  Location: [place];  Service: General;  Laterality: N/A;  . MOHS SURGERY     back  . MOHS SURGERY  2017   face  . MULTIPLE TOOTH EXTRACTIONS    . PERINEAL HIDRADENITIS EXCISION  1998, 1999  . TRANSURETHRAL RESECTION OF BLADDER TUMOR  05/21/2012   Procedure: TRANSURETHRAL RESECTION OF BLADDER TUMOR (TURBT);  Surgeon: Claybon Jabs, MD;  Location: [facility];  Service: Urology;  Laterality: N/A;       Social History   Socioeconomic History  . Marital status: Married    Spouse name: Not on file  . Number of children: Not on file  . Years of education: Not on file  . Highest education level: Not on file  Occupational History  . Occupation: Disabled  Tobacco Use  . Smoking status: Former Smoker    Packs/day: 1.00    Years: 41.00    Pack years: 41.00    Types: Cigars, Cigarettes    Quit date: 04/11/2016    Years since quitting: 4.2  . Smokeless tobacco: Never Used  Vaping Use  . Vaping Use: Former  Substance and Sexual Activity  . Alcohol use: No    Alcohol/week: 0.0 standard drinks  . Drug use: No  . Sexual activity: Not on file  Other Topics Concern  . Not on file  Social History Narrative  . Not on file   Social Determinants of Health   Financial Resource Strain: Low Risk   . Difficulty of Paying Living Expenses: Not hard at all  Food Insecurity: No Food Insecurity  . Worried About Charity fundraiser in the Last Year: Never true  . Ran Out of Food in the Last Year: Never true  Transportation Needs:  No Transportation Needs  . Lack of Transportation (Medical): No  . Lack of Transportation (Non-Medical): No  Physical Activity: Inactive  . Days of Exercise per Week: 0 days  . Minutes of Exercise per Session: 0 min  Stress: No Stress Concern Present  . Feeling of Stress : Not at all  Social Connections: Moderately Isolated  . Frequency of Communication with Friends and Family: More than three times a week  . Frequency of Social Gatherings with Friends and Family: Never  . Attends Religious Services: Never  . Active Member of Clubs or Organizations: No  . Attends Archivist Meetings:  Never  . Marital Status: Married  Human resources officer Violence:   . Fear of Current or Ex-Partner: Not on file  . Emotionally Abused: Not on file  . Physically Abused: Not on file  . Sexually Abused: Not on file    Current Outpatient Medications on File Prior to Visit  Medication Sig Dispense Refill  . aspirin EC 81 MG tablet Take 1 tablet (81 mg total) by mouth daily.    Marland Kitchen atorvastatin (LIPITOR) 40 MG tablet Take 1 40 mg tablet by mouth daily 90 tablet 3  . Blood Glucose Monitoring Suppl (ONE TOUCH ULTRA 2) w/Device KIT Use as directed to check blood sugars twice a day 1 kit 0  . diltiazem (CARDIZEM CD) 240 MG 24 hr capsule Take 1 capsule (240 mg total) by mouth daily. 90 capsule 3  . glucose blood (ONETOUCH ULTRA) test strip 1 each by Other route 2 (two) times daily. (Patient taking differently: 1 each by Other route daily. ) 100 each 12  . hydrochlorothiazide (HYDRODIURIL) 25 MG tablet TAKE 1 TABLET BY MOUTH  DAILY 90 tablet 3  . Lancets (ONETOUCH ULTRASOFT) lancets 1 each by Other route 2 (two) times daily. Dx E11.9 and Z79.4 180 each 3  . loratadine (CLARITIN) 10 MG tablet Take 10 mg by mouth daily as needed.     Marland Kitchen losartan (COZAAR) 100 MG tablet TAKE 1 TABLET BY MOUTH  DAILY 90 tablet 3  . pantoprazole (PROTONIX) 40 MG tablet TAKE 1 TABLET BY MOUTH  DAILY 90 tablet 3  . spironolactone  (ALDACTONE) 25 MG tablet Take 1 tablet (25 mg total) by mouth daily. 90 tablet 3   No current facility-administered medications on file prior to visit.    Allergies  Allergen Reactions  . Cefepime Hives    Had allergic reaction to one of these 3 agents, unclear which one  . Metoprolol Hives    Had allergic reaction to one of these 3 agents, unclear which one  *Per RN, highly likely this is the cause of allergic rxn  . Myrbetriq [Mirabegron] Hives    Had allergic reaction to one of these 3 agents, unclear which one    Family History  Problem Relation Age of Onset  . Hypertension Mother   . Cancer Father        lung ca  . Diabetes Maternal Aunt        x 2  . Colon cancer Neg Hx   . Esophageal cancer Neg Hx   . Pancreatic cancer Neg Hx   . Prostate cancer Neg Hx   . Kidney disease Neg Hx   . Liver disease Neg Hx   . Lung cancer Neg Hx   . Rectal cancer Neg Hx   . Stomach cancer Neg Hx     BP (!) 158/70   Pulse 68   Ht _0  (1.727 m)   Wt 254 lb 12.8 oz (115.6 kg)   SpO2 96%   BMI 38.74 kg/m    Review of Systems He denies hypoglycemia.      Objective:   Physical Exam VITAL SIGNS:  See vs page GENERAL: no distress Pulses: dorsalis pedis intact bilat.   MSK: no deformity of the feet CV: 1+ bilat leg edema, and bilat vv's.  Skin:  no ulcer on the feet, but there are bilat heavy calluses.  normal color and temp on the feet.   Neuro: sensation is intact to touch on the feet.   Ext: there is bilateral onychomycosis  of the toenails.   Lab Results  Component Value Date   HGBA1C 9.3 (A) 06/22/2020       Assessment & Plan:  Insulin-requiring type 2 DM, with CRI: uncontrolled  Patient Instructions  Your blood pressure is high today.  Please see your primary care provider soon, to have it rechecked.   please increase the Toujeo to 270 units each morning.  I have sent a prescription to your pharmacy, for this.   check your blood sugar twice a day.  vary the time  of day when you check, between before the 3 meals, and at bedtime.  also check if you have symptoms of your blood sugar being too high or too low.  please keep a record of the readings and bring it to your next appointment here (or you can bring the meter itself).  You can write it on any piece of paper.  please call us sooner if your blood sugar goes below 70, or if you have a lot of readings over 200.   Please come back for a follow-up appointment in 2 months.

## 2020-06-22 NOTE — Patient Instructions (Addendum)
Your blood pressure is high today.  Please see your primary care provider soon, to have it rechecked.   please increase the Toujeo to 270 units each morning.  I have sent a prescription to your pharmacy, for this.   check your blood sugar twice a day.  vary the time of day when you check, between before the 3 meals, and at bedtime.  also check if you have symptoms of your blood sugar being too high or too low.  please keep a record of the readings and bring it to your next appointment here (or you can bring the meter itself).  You can write it on any piece of paper.  please call us sooner if your blood sugar goes below 70, or if you have a lot of readings over 200.   Please come back for a follow-up appointment in 2 months.

## 2020-06-23 DIAGNOSIS — S62302A Unspecified fracture of third metacarpal bone, right hand, initial encounter for closed fracture: Secondary | ICD-10-CM | POA: Diagnosis not present

## 2020-06-25 ENCOUNTER — Telehealth: Payer: Self-pay | Admitting: Internal Medicine

## 2020-06-25 ENCOUNTER — Other Ambulatory Visit: Payer: Self-pay | Admitting: *Deleted

## 2020-06-25 ENCOUNTER — Telehealth: Payer: Self-pay

## 2020-06-25 DIAGNOSIS — E1151 Type 2 diabetes mellitus with diabetic peripheral angiopathy without gangrene: Secondary | ICD-10-CM

## 2020-06-25 DIAGNOSIS — Z794 Long term (current) use of insulin: Secondary | ICD-10-CM

## 2020-06-25 MED ORDER — TOUJEO MAX SOLOSTAR 300 UNIT/ML ~~LOC~~ SOPN: 270.0000 [IU] | PEN_INJECTOR | SUBCUTANEOUS | 3 refills | Status: DC

## 2020-06-25 NOTE — Telephone Encounter (Signed)
Re-sent to [facility]

## 2020-06-25 NOTE — Telephone Encounter (Signed)
Needs ov

## 2020-06-25 NOTE — Telephone Encounter (Signed)
diltiazem (CARDIZEM CD) 240 MG 24 hr capsule Patients wife calling stating his blood pressure has been running high over the weekend and was wondering if he needs to change the dosage BP over weekend was 200 on Saturday, been running from 160-200

## 2020-06-25 NOTE — Telephone Encounter (Signed)
New message   1. Which medications need to be refilled? (please list name of each medication and dose if known) insulin glargine, 2 Unit Dial, (TOUJEO MAX SOLOSTAR) 300 UNIT/ML Solostar Pen  2. Which pharmacy/location (including street and city if local pharmacy) is medication to be sent to? [facility] in [place]   3. Do they need a 30 day or 90 day supply? 30 months is cheaper.

## 2020-06-25 NOTE — Telephone Encounter (Signed)
Error

## 2020-06-26 ENCOUNTER — Other Ambulatory Visit: Payer: Self-pay

## 2020-06-26 ENCOUNTER — Encounter: Payer: Self-pay | Admitting: Internal Medicine

## 2020-06-26 ENCOUNTER — Ambulatory Visit (INDEPENDENT_AMBULATORY_CARE_PROVIDER_SITE_OTHER): Payer: Medicare Other | Admitting: Internal Medicine

## 2020-06-26 VITALS — BP 160/70 | HR 84 | Temp 98.1°F | Ht 68.0 in | Wt 253.0 lb

## 2020-06-26 DIAGNOSIS — E785 Hyperlipidemia, unspecified: Secondary | ICD-10-CM

## 2020-06-26 DIAGNOSIS — I10 Essential (primary) hypertension: Secondary | ICD-10-CM | POA: Diagnosis not present

## 2020-06-26 DIAGNOSIS — E1151 Type 2 diabetes mellitus with diabetic peripheral angiopathy without gangrene: Secondary | ICD-10-CM | POA: Diagnosis not present

## 2020-06-26 DIAGNOSIS — M79641 Pain in right hand: Secondary | ICD-10-CM | POA: Diagnosis not present

## 2020-06-26 DIAGNOSIS — E559 Vitamin D deficiency, unspecified: Secondary | ICD-10-CM

## 2020-06-26 DIAGNOSIS — Z794 Long term (current) use of insulin: Secondary | ICD-10-CM

## 2020-06-26 MED ORDER — DILTIAZEM HCL ER COATED BEADS 360 MG PO CP24: 360.0000 mg | ORAL_CAPSULE | Freq: Every day | ORAL | 3 refills | Status: DC

## 2020-06-26 NOTE — Progress Notes (Signed)
Subjective:    Patient ID: [name], male    DOB: Oct 12, 1955, 64 y.o.   MRN: [id number]  HPI  Here to f/u; overall doing ok,  Pt denies chest pain, increasing sob or doe, wheezing, orthopnea, PND, [name], palpitations, dizziness or syncope.  Pt denies new neurological symptoms such as new headache, or facial or extremity weakness or numbness.  Pt denies polydipsia, polyuria, or low sugar episode.  Pt states overall good compliance with meds, mostly trying to follow appropriate diet, with wt overall stable,  but little exercise however.  No new complaints Past Medical History:  Diagnosis Date  . Allergic rhinitis   . At risk for sleep apnea    STOP-BANG= 5   SENT TO PCP 03-14-2014  . CAD (coronary artery disease)    a. 07/2014 low risk MV; b. 07/2019 Cor CTA (FFR): LM 25-49 (nl), LAD mild prox/mid plaque (nl), D1 25-49p(nl w/ abnl FFR of 0.73 in inf branch), LCX/OM1 mild prox/mid plaque (nl), RCA nondominant, minimal Ca2+ plaque (nl), RPDA (mildly abnl @ 0.79)-->Med Rx..  . Cataract    surgically removed bilateral  . Condyloma acuminatum of penis   . Diabetic neuropathy ([place])   . Diastolic dysfunction    a. 05/2019 Echo: EF 60-65%, no rwam, mod LVH, impaired relaxation, nl RV size/fxn, trace MR, triv TR.  Marland Kitchen GERD (gastroesophageal reflux disease)   . History of bladder cancer    s/p  turbt  2013/   transitional cell carcinoma--   . History of condyloma acuminatum    PERINEAL AREA  W/ RECURRENCY  . History of gout   . Hyperlipidemia   . Hypertension   . Lower urinary tract symptoms (LUTS)   . PAF (paroxysmal atrial fibrillation) ([place])    a. 06/2019 Event monitor: PAF <1% burden. Longest 3 mins 36 secs.  . Productive cough   . PSVT (paroxysmal supraventricular tachycardia) ([place])    a. 06/2019 Event monitor: 112 episodes of SVT, longest 21 secs.  Marland Kitchen PVD (peripheral vascular disease) with claudication ([place])    a. 03/2014 LE art duplex: long segment occlusion of mid to  distal R SFA; b. 03/2020 ABI: nl left and mildly improved R ABI->med rx.  . Smokers' cough ([place])   . Type 2 diabetes mellitus with insulin therapy ([place]) 1992   monitor by  dr ellsion  . Wears dentures    upper   Past Surgical History:  Procedure Laterality Date  . AXILLARY HIDRADENITIS EXCISION  1997  . CARDIOVASCULAR STRESS TEST  07-24-2014  dr Kathlyn [place]   Low risk lexiscan nuclear study with apical thinning and small inferolateral wall infarct at mid & basal level , no ischemia/  normal LVF and wall motion , ef 59%  . CATARACT EXTRACTION Left   . CATARACT EXTRACTION W/ INTRAOCULAR LENS IMPLANT Right   . CO2 LASER APPLICATION N/A 10/06/3333   Procedure: CO2 LASER APPLICATION,PENIS, GROIN, ANUS;  Surgeon: [name], MD;  Location: [place];  Service: General;  Laterality: N/A;  . CO2 LASER APPLICATION N/A 45/62/5638   Procedure: CO2 LASER APPLICATION;  Surgeon: Kathie Rhodes, MD;  [facility];  Service: Urology;  Laterality: N/A;  . CONDYLOMA EXCISION/FULGURATION N/A 05/21/2015   Procedure: CONDYLOMA REMOVAL;  Surgeon: Kathie Rhodes, MD;  Location: [facility];  Service: Urology;  Laterality: N/A;  . HEMORRHOID SURGERY  10/24/2014   Procedure: HEMORRHOIDECTOMY;  Surgeon: Michael Boston, MD;  Location: [facility];  Service: General;;  . INCISION AND DRAINAGE ABSCESS Left 10/24/2014   Procedure: INCISION AND DRAINAGE ABSCESS;  Surgeon: Michael Boston, MD;  Location: [place];  Service: General;  Laterality: Left;  . INGUINAL HIDRADENITIS EXCISION  1998, 1999  . LASER ABLATION CONDOLAMATA N/A 03/20/2014   Procedure: EXAM UNDER ANESTHESIA, REMOVAL/ABLATION OF CONDYLOMATA PENIS,GROINS, ANUS, ANAL CANAL;  Surgeon: [name], MD;  Location: [place];  Service: General;  Laterality: N/A;  groin and anus  . LASER ABLATION CONDOLAMATA N/A 10/24/2014   Procedure: LASER ABLATION  CONDOLAMATA;  Surgeon: Michael Boston, MD;  Location: [facility];  Service: General;  Laterality: N/A;  . LASER ABLATION OF PENILE AND PERIANAL WARTS  07-29-2007  Dr. Johney Maine  . LEFT SHOULDER SURGERY  2003  . MASS EXCISION N/A 10/24/2014   Procedure: EXCISION OF PERINEAL MASS/SINUS;  Surgeon: Michael Boston, MD;  Location: [place];  Service: General;  Laterality: N/A;  . MOHS SURGERY     back  . MOHS SURGERY  2017   face  . MULTIPLE TOOTH EXTRACTIONS    . PERINEAL HIDRADENITIS EXCISION  1998, 1999  . TRANSURETHRAL RESECTION OF BLADDER TUMOR  05/21/2012   Procedure: TRANSURETHRAL RESECTION OF BLADDER TUMOR (TURBT);  Surgeon: Claybon Jabs, MD;  Location: [facility];  Service: Urology;  Laterality: N/A;       reports that he quit smoking about 4 years ago. His smoking use included cigars and cigarettes. He has a 41.00 pack-year smoking history. He has never used smokeless tobacco. He reports that he does not drink alcohol and does not use drugs. family history includes Cancer in his father; Diabetes in his maternal aunt; Hypertension in his mother. Allergies  Allergen Reactions  . Cefepime Hives    Had allergic reaction to one of these 3 agents, unclear which one  . Metoprolol Hives    Had allergic reaction to one of these 3 agents, unclear which one  *Per RN, highly likely this is the cause of allergic rxn  . Myrbetriq [Mirabegron] Hives    Had allergic reaction to one of these 3 agents, unclear which one   Current Outpatient Medications on File Prior to Visit  Medication Sig Dispense Refill  . aspirin EC 81 MG tablet Take 1 tablet (81 mg total) by mouth daily.    Marland Kitchen atorvastatin (LIPITOR) 40 MG tablet Take 1 40 mg tablet by mouth daily 90 tablet 3  . Blood Glucose Monitoring Suppl (ONE TOUCH ULTRA 2) w/Device KIT Use as directed to check blood sugars twice a day 1 kit 0  . doxycycline (VIBRA-TABS) 100 MG tablet Take 100 mg by mouth 2 (two)  times daily.    Marland Kitchen glucose blood (ONETOUCH ULTRA) test strip 1 each by Other route 2 (two) times daily. (Patient taking differently: 1 each by Other route daily. ) 100 each 12  . hydrochlorothiazide (HYDRODIURIL) 25 MG tablet TAKE 1 TABLET BY MOUTH  DAILY 90 tablet 3  . insulin glargine, 2 Unit Dial, (TOUJEO MAX SOLOSTAR) 300 UNIT/ML Solostar Pen Inject 270 Units into the skin every morning. 90 mL 3  . Lancets (ONETOUCH ULTRASOFT) lancets 1 each by Other route 2 (two) times daily. Dx E11.9 and Z79.4 180 each 3  . loratadine (CLARITIN) 10 MG tablet Take 10 mg by mouth daily as needed.     Marland Kitchen losartan (COZAAR) 100 MG tablet TAKE 1 TABLET BY MOUTH  DAILY 90 tablet 3  .  pantoprazole (PROTONIX) 40 MG tablet TAKE 1 TABLET BY MOUTH  DAILY 90 tablet 3  . spironolactone (ALDACTONE) 25 MG tablet Take 1 tablet (25 mg total) by mouth daily. 90 tablet 3   No current facility-administered medications on file prior to visit.   Review of Systems All otherwise neg per pt    Objective:   Physical Exam BP (!) 160/70 (BP Location: Left Arm, Patient Position: Sitting, Cuff Size: Large)   Pulse 84   Temp 98.1 F (36.7 C) (Oral)   Ht 5' 8"  (1.727 m)   Wt 253 lb (114.8 kg)   SpO2 94%   BMI 38.47 kg/m  VS noted,  Constitutional: Pt appears in NAD HENT: Head: NCAT.  Right Ear: External ear normal.  Left Ear: External ear normal.  Eyes: . Pupils are equal, round, and reactive to light. Conjunctivae and EOM are normal Nose: without d/c or deformity Neck: Neck supple. Gross normal ROM Cardiovascular: Normal rate and regular rhythm.   Pulmonary/Chest: Effort normal and breath sounds without rales or wheezing.  Abd:  Soft, NT, ND, + BS, no organomegaly Neurological: Pt is alert. At baseline orientation, motor grossly intact Skin: Skin is warm. No rashes, other new lesions, no LE edema Psychiatric: Pt behavior is normal without agitation  All otherwise neg per pt  Lab Results  Component Value Date   WBC  11.7 (H) 10/20/2019   HGB 14.3 10/20/2019   HCT 41.5 10/20/2019   PLT 260.0 10/20/2019   GLUCOSE 208 (H) 02/29/2020   CHOL 96 10/20/2019   TRIG 151.0 (H) 10/20/2019   HDL 30.20 (L) 10/20/2019   LDLDIRECT 67.0 06/15/2018   LDLCALC 35 10/20/2019   ALT 37 10/20/2019   AST 28 10/20/2019   NA 137 02/29/2020   K 3.5 02/29/2020   CL 95 (L) 02/29/2020   CREATININE 1.06 02/29/2020   BUN 20 02/29/2020   CO2 26 02/29/2020   TSH 1.54 10/20/2019   PSA 3.73 10/20/2019   INR 1.1 06/04/2019   HGBA1C 9.3 (A) 06/22/2020   MICROALBUR 24.0 (H) 10/20/2019       Assessment & Plan:

## 2020-06-26 NOTE — Telephone Encounter (Signed)
    Juliann Pulse from [facility] calling to provide blood pressure readings for patient  Nov 13, 166/80 Nov 14, 170/81 Nov 15, 199/90 and 166/85 Nov 16, 135/82  Appointment scheduled for 11/16

## 2020-06-26 NOTE — Patient Instructions (Addendum)
Ok to increase the diltiazem ER to 360 mg per day  Please continue to monitor your BP as you do  Please make an Appointment to return in 2 month

## 2020-06-28 ENCOUNTER — Ambulatory Visit: Payer: Medicare Other | Admitting: Endocrinology

## 2020-06-30 ENCOUNTER — Encounter: Payer: Self-pay | Admitting: Internal Medicine

## 2020-06-30 NOTE — Assessment & Plan Note (Signed)
stable overall by history and exam, recent data reviewed with pt, and pt to continue medical treatment as before,  to f/u any worsening symptoms or concerns  

## 2020-06-30 NOTE — Assessment & Plan Note (Signed)
Cont oral bit d 2000 qd

## 2020-06-30 NOTE — Assessment & Plan Note (Signed)
Pt wary of increased insulin dosing, I encouraged f/u endo as planned

## 2020-06-30 NOTE — Assessment & Plan Note (Addendum)
Uncontrollled, for increased  Card er 360 qd  I spent 31 minutes in preparing to see the patient by review of recent labs, imaging and procedures, obtaining and reviewing separately obtained history, communicating with the patient and family or caregiver, ordering medications, tests or procedures, and documenting clinical information in the EHR including the differential Dx, treatment, and any further evaluation and other management of htn, dhm, hld, vit d def

## 2020-07-02 ENCOUNTER — Telehealth: Payer: Self-pay | Admitting: Endocrinology

## 2020-07-02 NOTE — Telephone Encounter (Signed)
Juliann Pulse RN with [facility] called re: Patient's last 3 days of Fasting blood sugars 06/30/20=240 07/01/20=267 07/02/20=461 Juliann Pulse states to call patient directly with any questions or instructions

## 2020-07-02 NOTE — Telephone Encounter (Signed)
please increase the Toujeo to 300 units each morning.

## 2020-07-03 NOTE — Telephone Encounter (Signed)
Called patient, LVM advising of message from MD.  Advised patient to call back in a few days to let us know how the sugars are doing.  Left call back number.

## 2020-07-13 DIAGNOSIS — S62332A Displaced fracture of neck of third metacarpal bone, right hand, initial encounter for closed fracture: Secondary | ICD-10-CM | POA: Diagnosis not present

## 2020-07-17 ENCOUNTER — Telehealth: Payer: Self-pay | Admitting: Internal Medicine

## 2020-07-17 NOTE — Telephone Encounter (Signed)
   [name] from [facility] calling to report patients recent blood pressures No symptoms  Today 156/83 Dec 6, 158/76 Dec 5, 168/78 Dec 4, 160/70  [facility] requesting  patient be called

## 2020-07-17 NOTE — Telephone Encounter (Signed)
Sent to Dr. Jenny Reichmann

## 2020-07-20 MED ORDER — IRBESARTAN 300 MG PO TABS: 300.0000 mg | ORAL_TABLET | Freq: Every day | ORAL | 3 refills | Status: DC

## 2020-07-20 MED ORDER — HYDROCHLOROTHIAZIDE 25 MG PO TABS: 25.0000 mg | ORAL_TABLET | Freq: Every day | ORAL | 3 refills | Status: DC

## 2020-07-20 NOTE — Telephone Encounter (Signed)
BP is consistently moderately elevated  Ok for change losartan to irbesartan 300 mg per day, which is more effective, continue all other tx  Staff to please inform pt, I will do rx x 1

## 2020-08-06 ENCOUNTER — Other Ambulatory Visit: Payer: Self-pay | Admitting: Physician Assistant

## 2020-08-06 NOTE — Telephone Encounter (Signed)
Please call patient to discuss BP meds Should he still be taking Diltiazem

## 2020-08-30 ENCOUNTER — Telehealth: Payer: Self-pay | Admitting: Endocrinology

## 2020-08-30 DIAGNOSIS — Z794 Long term (current) use of insulin: Secondary | ICD-10-CM

## 2020-08-30 NOTE — Telephone Encounter (Signed)
Patient requests to be called at ph# [phone]  re: Patient is supposed to be getting 300 units of insulin glargine, 2 Unit Dial, (TOUJEO MAX SOLOSTAR) 300 UNIT/ML Solostar Pen, however, Patient has only been receiving  270 units of the above listed medication, and today (08/30/20) Patient only received 225 units of the above listed medication

## 2020-08-31 ENCOUNTER — Ambulatory Visit: Payer: Medicare Other | Admitting: Internal Medicine

## 2020-08-31 ENCOUNTER — Ambulatory Visit: Payer: Medicare Other | Admitting: Endocrinology

## 2020-08-31 ENCOUNTER — Other Ambulatory Visit: Payer: Self-pay

## 2020-08-31 MED ORDER — TOUJEO MAX SOLOSTAR 300 UNIT/ML ~~LOC~~ SOPN: 300.0000 [IU] | PEN_INJECTOR | SUBCUTANEOUS | 3 refills | Status: DC

## 2020-08-31 NOTE — Telephone Encounter (Signed)
I have sent a prescription to your pharmacy, to update to 300 units qam

## 2020-08-31 NOTE — Telephone Encounter (Signed)
Please verify--only see 270 units glargine.

## 2020-08-31 NOTE — Telephone Encounter (Signed)
Notified pt Rx sent to Pharmacy. Pt stated Toujeo--the cost increase with the [employer] and not able to afford it. Notified pt to call insurance to find out which medication is cover. Pt understood without questions

## 2020-09-04 ENCOUNTER — Ambulatory Visit (INDEPENDENT_AMBULATORY_CARE_PROVIDER_SITE_OTHER): Payer: Medicare Other | Admitting: Endocrinology

## 2020-09-04 ENCOUNTER — Other Ambulatory Visit: Payer: Self-pay

## 2020-09-04 VITALS — BP 160/60 | HR 69 | Ht 68.0 in | Wt 264.2 lb

## 2020-09-04 DIAGNOSIS — Z794 Long term (current) use of insulin: Secondary | ICD-10-CM | POA: Diagnosis not present

## 2020-09-04 DIAGNOSIS — E1151 Type 2 diabetes mellitus with diabetic peripheral angiopathy without gangrene: Secondary | ICD-10-CM | POA: Diagnosis not present

## 2020-09-04 LAB — POCT GLYCOSYLATED HEMOGLOBIN (HGB A1C): Hemoglobin A1C: 9.4 % — AB (ref 4.0–5.6)

## 2020-09-04 MED ORDER — TOUJEO MAX SOLOSTAR 300 UNIT/ML ~~LOC~~ SOPN: 270.0000 [IU] | PEN_INJECTOR | SUBCUTANEOUS | 3 refills | Status: DC

## 2020-09-04 MED ORDER — TRULICITY 0.75 MG/0.5ML ~~LOC~~ SOAJ: 0.7500 mg | SUBCUTANEOUS | 3 refills | Status: DC

## 2020-09-04 NOTE — Progress Notes (Signed)
Subjective:    Patient ID: [name], male    DOB: June 17, 1956, 65 y.o.   MRN: [id number]  HPI Pt returns for f/u of diabetes mellitus:  DM type: Insulin-requiring type 2.   Dx'ed: 9629 Complications: PAD, CRI, and PN.   Therapy: insulin since 2009.   DKA: never Severe hypoglycemia: never.   Pancreatitis: never.  SDOH: he could not afford Trulicity.   Other: he takes a QD insulin regimen, after poor results with more frequent injections; he was changed to 70/30, due to pattern of cbg's, but he did poorly on this, too.   Interval history: He brings his meter with his cbg's which I have reviewed today.  cbg's vary from 89-319.  It is in general higher as the day goes on.   Past Medical History:  Diagnosis Date  . Allergic rhinitis   . At risk for sleep apnea    STOP-BANG= 5   SENT TO PCP 03-14-2014  . CAD (coronary artery disease)    a. 07/2014 low risk MV; b. 07/2019 Cor CTA (FFR): LM 25-49 (nl), LAD mild prox/mid plaque (nl), D1 25-49p(nl w/ abnl FFR of 0.73 in inf branch), LCX/OM1 mild prox/mid plaque (nl), RCA nondominant, minimal Ca2+ plaque (nl), RPDA (mildly abnl @ 0.79)-->Med Rx..  . Cataract    surgically removed bilateral  . Condyloma acuminatum of penis   . Diabetic neuropathy ([place])   . Diastolic dysfunction    a. 05/2019 Echo: EF 60-65%, no rwam, mod LVH, impaired relaxation, nl RV size/fxn, trace MR, triv TR.  Marland Kitchen GERD (gastroesophageal reflux disease)   . History of bladder cancer    s/p  turbt  2013/   transitional cell carcinoma--   . History of condyloma acuminatum    PERINEAL AREA  W/ RECURRENCY  . History of gout   . Hyperlipidemia   . Hypertension   . Lower urinary tract symptoms (LUTS)   . PAF (paroxysmal atrial fibrillation) ([place])    a. 06/2019 Event monitor: PAF <1% burden. Longest 3 mins 36 secs.  . Productive cough   . PSVT (paroxysmal supraventricular tachycardia) ([place])    a. 06/2019 Event monitor: 112 episodes of SVT, longest 21 secs.  Marland Kitchen PVD  (peripheral vascular disease) with claudication ([place])    a. 03/2014 LE art duplex: long segment occlusion of mid to distal R SFA; b. 03/2020 ABI: nl left and mildly improved R ABI->med rx.  . Smokers' cough ([place])   . Type 2 diabetes mellitus with insulin therapy ([place]) 1992   monitor by  dr ellsion  . Wears dentures    upper    Past Surgical History:  Procedure Laterality Date  . AXILLARY HIDRADENITIS EXCISION  1997  . CARDIOVASCULAR STRESS TEST  07-24-2014  dr Kathlyn Sacramento   Low risk lexiscan nuclear study with apical thinning and small inferolateral wall infarct at mid & basal level , no ischemia/  normal LVF and wall motion , ef 59%  . CATARACT EXTRACTION Left   . CATARACT EXTRACTION W/ INTRAOCULAR LENS IMPLANT Right   . CO2 LASER APPLICATION N/A 01/06/4131   Procedure: CO2 LASER APPLICATION,PENIS, GROIN, ANUS;  Surgeon: [name], MD;  Location: [place];  Service: General;  Laterality: N/A;  . CO2 LASER APPLICATION N/A 44/08/270   Procedure: CO2 LASER APPLICATION;  Surgeon: Kathie Rhodes, MD;  Location: [facility];  Service: Urology;  Laterality: N/A;  . CONDYLOMA EXCISION/FULGURATION N/A 05/21/2015   Procedure: CONDYLOMA REMOVAL;  Surgeon:  Kathie Rhodes, MD;  Location[facility];  Service: Urology;  Laterality: N/A;  . HEMORRHOID SURGERY  10/24/2014   Procedure: HEMORRHOIDECTOMY;  Surgeon: Michael Boston, MD;  Location: [facility];  Service: General;;  . INCISION AND DRAINAGE ABSCESS Left 10/24/2014   Procedure: INCISION AND DRAINAGE ABSCESS;  Surgeon: Michael Boston, MD;  Location: [place];  Service: General;  Laterality: Left;  . INGUINAL HIDRADENITIS EXCISION  1998, 1999  . LASER ABLATION CONDOLAMATA N/A 03/20/2014   Procedure: EXAM UNDER ANESTHESIA, REMOVAL/ABLATION OF CONDYLOMATA PENIS,GROINS, ANUS, ANAL CANAL;  Surgeon: [name], MD;  Location: [place];  Service:  General;  Laterality: N/A;  groin and anus  . LASER ABLATION CONDOLAMATA N/A 10/24/2014   Procedure: LASER ABLATION CONDOLAMATA;  Surgeon: Michael Boston, MD;  Location: [facility];  Service: General;  Laterality: N/A;  . LASER ABLATION OF PENILE AND PERIANAL WARTS  07-29-2007  Dr. Johney Maine  . LEFT SHOULDER SURGERY  2003  . MASS EXCISION N/A 10/24/2014   Procedure: EXCISION OF PERINEAL MASS/SINUS;  Surgeon: Michael Boston, MD;  Location: [place];  Service: General;  Laterality: N/A;  . MOHS SURGERY     back  . MOHS SURGERY  2017   face  . MULTIPLE TOOTH EXTRACTIONS    . PERINEAL HIDRADENITIS EXCISION  1998, 1999  . TRANSURETHRAL RESECTION OF BLADDER TUMOR  05/21/2012   Procedure: TRANSURETHRAL RESECTION OF BLADDER TUMOR (TURBT);  Surgeon: Claybon Jabs, MD;  Location: [facility];  Service: Urology;  Laterality: N/A;       Social History   Socioeconomic History  . Marital status: Married    Spouse name: Not on file  . Number of children: Not on file  . Years of education: Not on file  . Highest education level: Not on file  Occupational History  . Occupation: Disabled  Tobacco Use  . Smoking status: Former Smoker    Packs/day: 1.00    Years: 41.00    Pack years: 41.00    Types: Cigars, Cigarettes    Quit date: 04/11/2016    Years since quitting: 4.4  . Smokeless tobacco: Never Used  Vaping Use  . Vaping Use: Former  Substance and Sexual Activity  . Alcohol use: No    Alcohol/week: 0.0 standard drinks  . Drug use: No  . Sexual activity: Not on file  Other Topics Concern  . Not on file  Social History Narrative  . Not on file   Social Determinants of Health   Financial Resource Strain: Low Risk   . Difficulty of Paying Living Expenses: Not hard at all  Food Insecurity: No Food Insecurity  . Worried About Charity fundraiser in the Last Year: Never true  . Ran Out of Food in the Last Year: Never true  Transportation Needs:  No Transportation Needs  . Lack of Transportation (Medical): No  . Lack of Transportation (Non-Medical): No  Physical Activity: Inactive  . Days of Exercise per Week: 0 days  . Minutes of Exercise per Session: 0 min  Stress: No Stress Concern Present  . Feeling of Stress : Not at all  Social Connections: Moderately Isolated  . Frequency of Communication with Friends and Family: More than three times a week  . Frequency of Social Gatherings with Friends and Family: Never  . Attends Religious Services: Never  . Active Member of Clubs or Organizations: No  . Attends Archivist  Meetings: Never  . Marital Status: Married  Human resources officer Violence: Not on file    Current Outpatient Medications on File Prior to Visit  Medication Sig Dispense Refill  . aspirin EC 81 MG tablet Take 1 tablet (81 mg total) by mouth daily.    Marland Kitchen atorvastatin (LIPITOR) 40 MG tablet Take 1 40 mg tablet by mouth daily 90 tablet 3  . Blood Glucose Monitoring Suppl (ONE TOUCH ULTRA 2) w/Device KIT Use as directed to check blood sugars twice a day 1 kit 0  . diltiazem (CARDIZEM CD) 360 MG 24 hr capsule Take 1 capsule (360 mg total) by mouth daily. 90 capsule 3  . glucose blood (ONETOUCH ULTRA) test strip 1 each by Other route 2 (two) times daily. (Patient taking differently: 1 each by Other route daily.) 100 each 12  . hydrochlorothiazide (HYDRODIURIL) 25 MG tablet Take 1 tablet (25 mg total) by mouth daily. 90 tablet 3  . irbesartan (AVAPRO) 300 MG tablet Take 1 tablet (300 mg total) by mouth daily. 90 tablet 3  . Lancets (ONETOUCH ULTRASOFT) lancets 1 each by Other route 2 (two) times daily. Dx E11.9 and Z79.4 180 each 3  . loratadine (CLARITIN) 10 MG tablet Take 10 mg by mouth daily as needed.     . pantoprazole (PROTONIX) 40 MG tablet TAKE 1 TABLET BY MOUTH  DAILY 90 tablet 3  . spironolactone (ALDACTONE) 25 MG tablet Take 1 tablet (25 mg total) by mouth daily. 90 tablet 3   No current  facility-administered medications on file prior to visit.    Allergies  Allergen Reactions  . Cefepime Hives    Had allergic reaction to one of these 3 agents, unclear which one  . Metoprolol Hives    Had allergic reaction to one of these 3 agents, unclear which one  *Per RN, highly likely this is the cause of allergic rxn  . Myrbetriq [Mirabegron] Hives    Had allergic reaction to one of these 3 agents, unclear which one    Family History  Problem Relation Age of Onset  . Hypertension Mother   . Cancer Father        lung ca  . Diabetes Maternal Aunt        x 2  . Colon cancer Neg Hx   . Esophageal cancer Neg Hx   . Pancreatic cancer Neg Hx   . Prostate cancer Neg Hx   . Kidney disease Neg Hx   . Liver disease Neg Hx   . Lung cancer Neg Hx   . Rectal cancer Neg Hx   . Stomach cancer Neg Hx     BP (!) 160/60 (BP Location: Right Arm, Patient Position: Sitting, Cuff Size: Large)   Pulse 69   Ht $R'5\' 8"'bn$  (1.727 m)   Wt 264 lb 3.2 oz (119.8 kg)   SpO2 95%   BMI 40.17 kg/m   Review of Systems He denies hypoglycemia    Objective:   Physical Exam VITAL SIGNS:  See vs page GENERAL: no distress Pulses: dorsalis pedis intact bilat.   MSK: no deformity of the feet CV: 1+ bilat leg edema, and bilat vv's.  Skin:  no ulcer on the feet, but there are bilat heavy calluses.  normal color and temp on the feet.   Neuro: sensation is intact to touch on the feet.   Ext: there is bilateral onychomycosis of the toenails.   A1c=9.4%     Assessment & Plan:  HTN: is noted today.  Insulin-requiring type 2 DM, with PAD: uncontrolled.    Patient Instructions  Your blood pressure is high today.  Please see your primary care provider soon, to have it rechecked.   please reduce the Toujeo to 270 units each morning.   I have sent a prescription to your pharmacy, to start Trulicity.   If you cannot afford the Trulicity and Toujeo, we'll need to change to NPH insulin.   check your blood  sugar twice a day.  vary the time of day when you check, between before the 3 meals, and at bedtime.  also check if you have symptoms of your blood sugar being too high or too low.  please keep a record of the readings and bring it to your next appointment here (or you can bring the meter itself).  You can write it on any piece of paper.  please call us sooner if your blood sugar goes below 70, or if you have a lot of readings over 200.   Please come back for a follow-up appointment in 2 months.

## 2020-09-04 NOTE — Patient Instructions (Addendum)
Your blood pressure is high today.  Please see your primary care provider soon, to have it rechecked.   please reduce the Toujeo to 270 units each morning.   I have sent a prescription to your pharmacy, to start Trulicity.   If you cannot afford the Trulicity and Toujeo, we'll need to change to NPH insulin.   check your blood sugar twice a day.  vary the time of day when you check, between before the 3 meals, and at bedtime.  also check if you have symptoms of your blood sugar being too high or too low.  please keep a record of the readings and bring it to your next appointment here (or you can bring the meter itself).  You can write it on any piece of paper.  please call us sooner if your blood sugar goes below 70, or if you have a lot of readings over 200.   Please come back for a follow-up appointment in 2 months.

## 2020-09-10 ENCOUNTER — Telehealth: Payer: Self-pay | Admitting: Endocrinology

## 2020-09-10 NOTE — Telephone Encounter (Signed)
Sorry, I don't understand: is he asking to change both meds to cheaper alternatives?

## 2020-09-10 NOTE — Telephone Encounter (Signed)
Pt --stated requesting to change medication to Tear-2 for the next refill for Toujeo and Trulicity.

## 2020-09-10 NOTE — Telephone Encounter (Signed)
Patient requests a call back from Dr Cordelia Pen nurse - did not want to get into it, but stated has been speaking with Vicente Males over the past week about the situation

## 2020-09-11 NOTE — Telephone Encounter (Signed)
LVM- pt regarding insurance  to change the  medication to tier 3 to tier 2.for low cost. PA started for XHFSFS--[id number],  and Trulicity ID--[id number].

## 2020-09-17 ENCOUNTER — Telehealth: Payer: Self-pay | Admitting: Cardiovascular Disease

## 2020-09-17 NOTE — Telephone Encounter (Signed)
Patient cancelled upcoming ROV declining to come to office.  Last visit virtual and avs lists in office fu.    Please advise.       At this time patient declined to reschedule in office as he can do all that at home.    Patient states he has a [employer] ipad that helps him monitor all vs and he can send this information .

## 2020-09-17 NOTE — Telephone Encounter (Signed)
Virtual visit is fine as long as he is able to get an accurate measurement of blood pressure and heart rate

## 2020-09-17 NOTE — Telephone Encounter (Signed)
To provider/ his nurse to review.

## 2020-09-18 NOTE — Telephone Encounter (Signed)
L MOM to schedule virtual appointment

## 2020-09-19 ENCOUNTER — Other Ambulatory Visit: Payer: Self-pay | Admitting: Internal Medicine

## 2020-09-19 ENCOUNTER — Telehealth: Payer: Self-pay | Admitting: Cardiovascular Disease

## 2020-09-19 NOTE — Telephone Encounter (Signed)
Please refill as per office routine med refill policy (all routine meds refilled for 3 mo or monthly per pt preference up to one year from last visit, then month to month grace period for 3 mo, then further med refills will have to be denied)  

## 2020-09-19 NOTE — Telephone Encounter (Signed)
BP readings routed to Dr. Fletcher Anon to review.

## 2020-09-19 NOTE — Telephone Encounter (Signed)
[facility] nurse calling to give some updated BP readings Jan 28 - 160/77 Jan 31 - 164/69 Feb 2 - 160/71 Feb 4 - 156/78 Feb 7 - 162/73 Feb 9 - 152/72  States that systolic BP's has been staying in 150s&160s over a 2 month period.  Patient has a virtual appt with Dr Fletcher Anon on 02/15.  Please call patient if needing to discuss.

## 2020-09-20 ENCOUNTER — Ambulatory Visit: Payer: Medicare Other | Admitting: Cardiovascular Disease

## 2020-09-20 IMAGING — DX DG CHEST 1V PORT
2 series · 2 of 2 positions shown · non-contrast
Comparison: Chest radiograph dated 10/02/2014

CLINICAL DATA: 63-year-old male with shortness of breath.

EXAM:
PORTABLE CHEST 1 VIEW

[chest ap (1 of 2)]
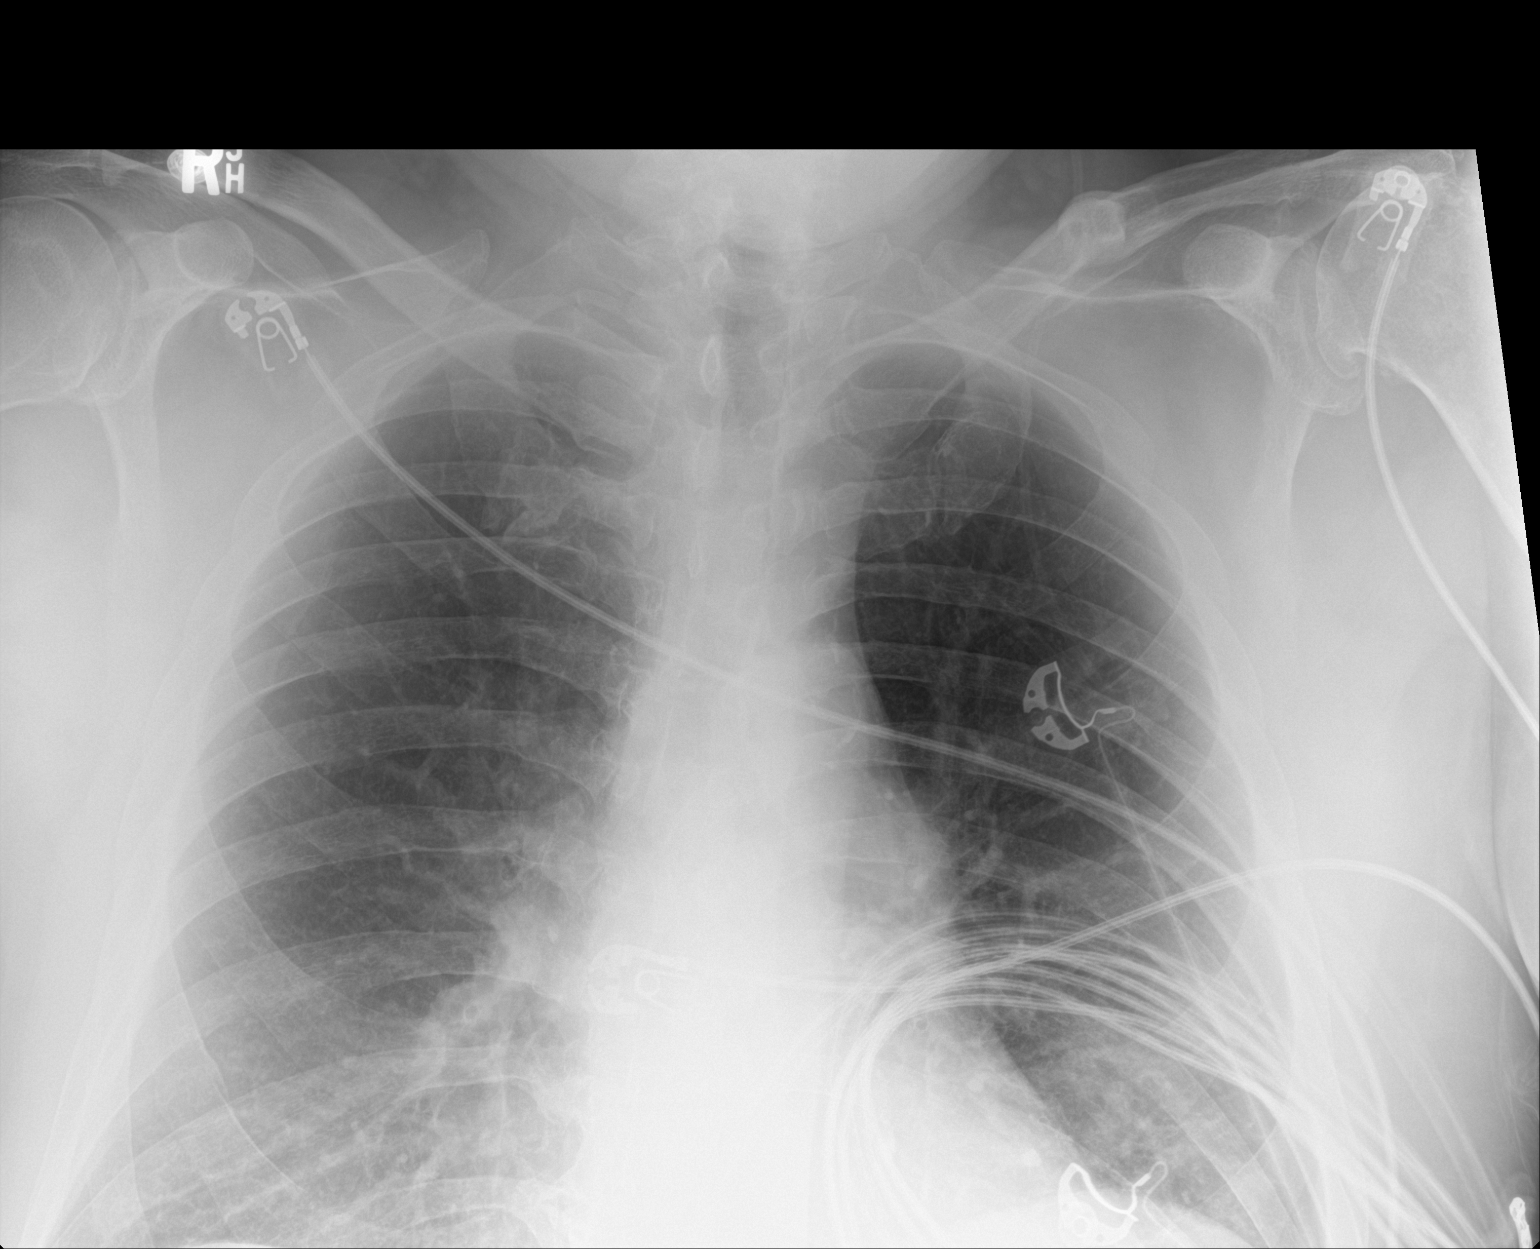

[chest ap (2 of 2)]
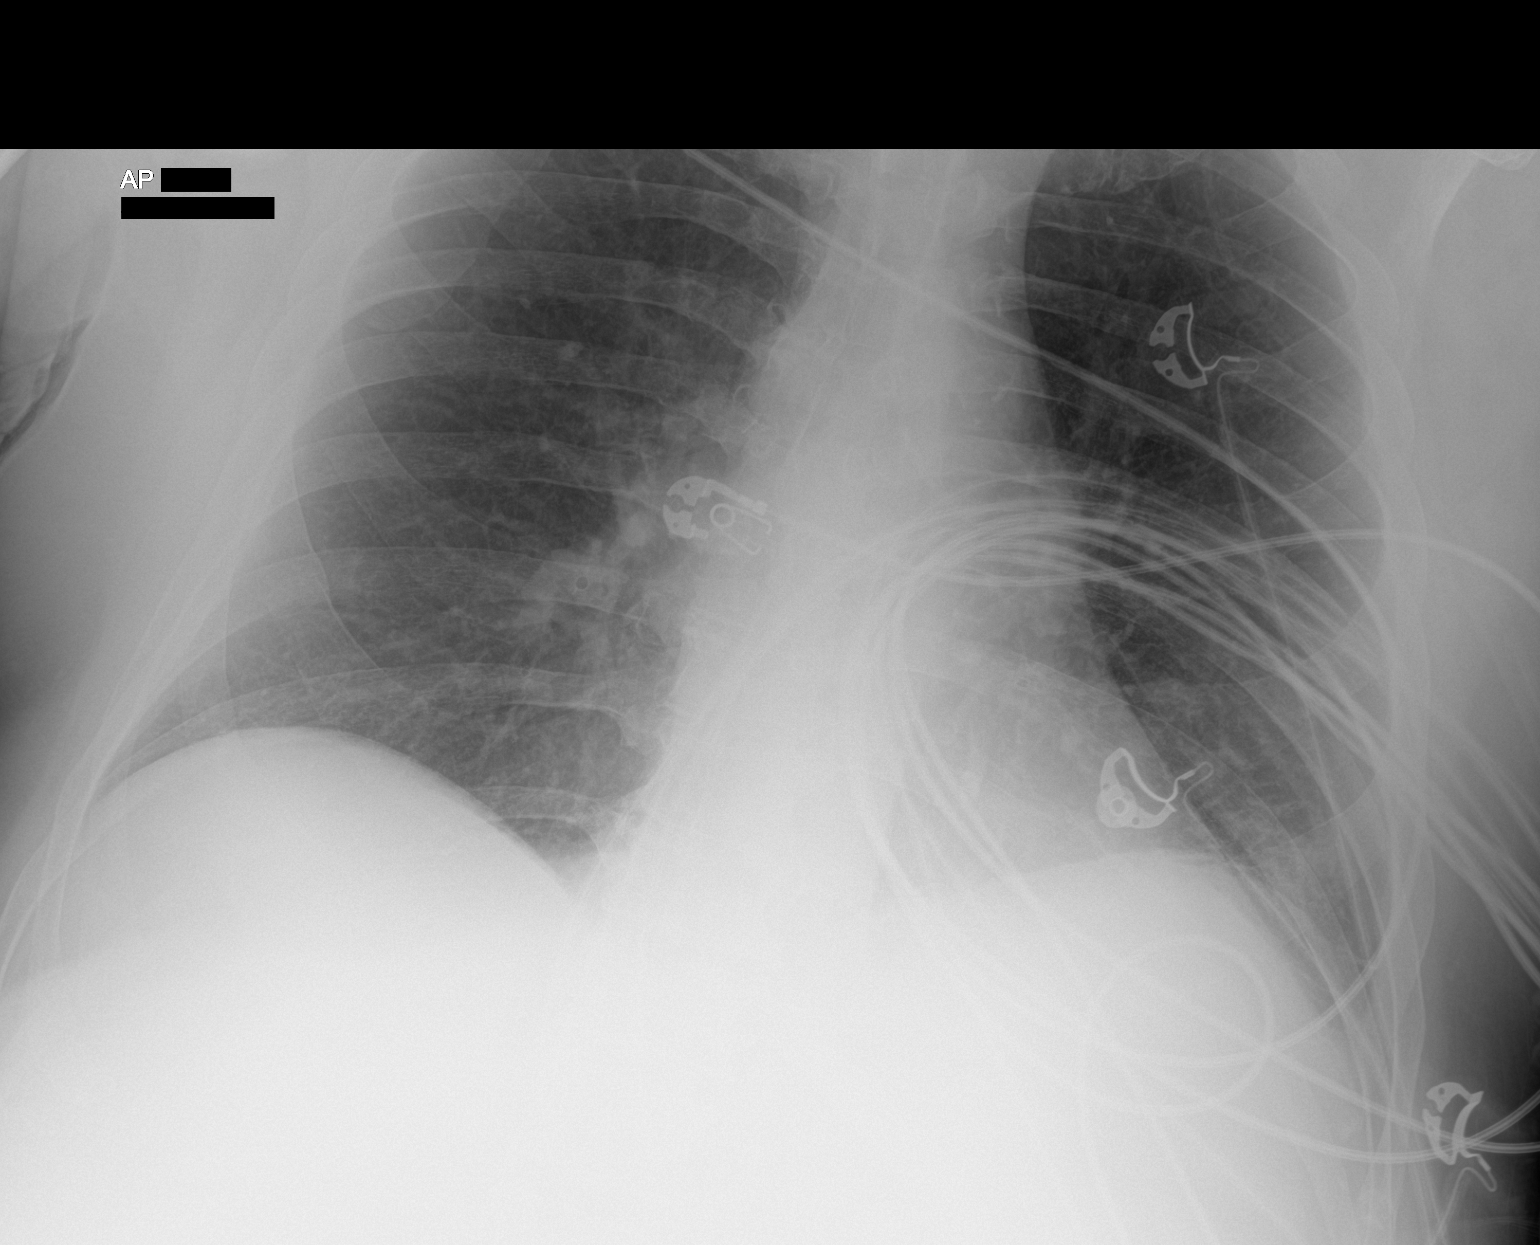

[2 of 2 positions shown; findings below may reference images not displayed]

FINDINGS: There is blunting of the left costophrenic angle which may represent
trace left pleural effusion. Left lung base densities, likely
atelectatic changes. Developing infiltrate is not excluded. Clinical
correlation is recommended. The right lung is clear. There is no
pneumothorax. The cardiac silhouette is within normal limits. No
acute osseous pathology.
IMPRESSION: Probable trace left pleural effusion and left lung base atelectasis.
Developing infiltrate is not excluded. Clinical correlation
recommended.

## 2020-09-25 ENCOUNTER — Encounter: Payer: Self-pay | Admitting: Cardiovascular Disease

## 2020-09-25 ENCOUNTER — Other Ambulatory Visit: Payer: Self-pay

## 2020-09-25 ENCOUNTER — Telehealth (INDEPENDENT_AMBULATORY_CARE_PROVIDER_SITE_OTHER): Payer: Medicare Other | Admitting: Cardiovascular Disease

## 2020-09-25 VITALS — BP 158/78 | HR 77 | Ht 68.0 in | Wt 261.0 lb

## 2020-09-25 DIAGNOSIS — E785 Hyperlipidemia, unspecified: Secondary | ICD-10-CM | POA: Diagnosis not present

## 2020-09-25 DIAGNOSIS — I2510 Atherosclerotic heart disease of native coronary artery without angina pectoris: Secondary | ICD-10-CM

## 2020-09-25 DIAGNOSIS — I10 Essential (primary) hypertension: Secondary | ICD-10-CM

## 2020-09-25 DIAGNOSIS — I739 Peripheral vascular disease, unspecified: Secondary | ICD-10-CM | POA: Diagnosis not present

## 2020-09-25 DIAGNOSIS — I471 Supraventricular tachycardia: Secondary | ICD-10-CM | POA: Diagnosis not present

## 2020-09-25 NOTE — Patient Instructions (Signed)
Medication Instructions:  Your physician recommends that you continue on your current medications as directed. Please refer to the Current Medication list given to you today.  *If you need a refill on your cardiac medications before your next appointment, please call your pharmacy*   Lab Work: None ordered If you have labs (blood work) drawn today and your tests are completely normal, you will receive your results only by: Marland Kitchen MyChart Message (if you have MyChart) OR . A paper copy in the mail If you have any lab test that is abnormal or we need to change your treatment, we will call you to review the results.   Testing/Procedures: Your physician has requested that you have a renal artery duplex. During this test, an ultrasound is used to evaluate blood flow to the kidneys. Allow one hour for this exam. Do not eat after midnight the day before and avoid carbonated beverages. Take your medications as you usually do. (To be scheduled in 3 months)   Follow-Up: At [facility], you and your health needs are our priority.  As part of our continuing mission to provide you with exceptional heart care, we have created designated Provider Care Teams.  These Care Teams include your primary Cardiologist (physician) and Advanced Practice Providers (APPs -  Physician Assistants and Nurse Practitioners) who all work together to provide you with the care you need, when you need it.  We recommend signing up for the patient portal called "MyChart".  Sign up information is provided on this After Visit Summary.  MyChart is used to connect with patients for Virtual Visits (Telemedicine).  Patients are able to view lab/test results, encounter notes, upcoming appointments, etc.  Non-urgent messages can be sent to your provider as well.   To learn more about what you can do with MyChart, go to [email or web address].    Your next appointment:   3 month(s) (To be scheduled after testing)  The format for your  next appointment:   In Person  Provider:   You may see Kathlyn Sacramento, MD or one of the following Advanced Practice Providers on your designated Care Team:    Murray Hodgkins, NP  Christell Faith, PA-C  Marrianne Mood, PA-C  Cadence [place], [place]  Laurann [place], NP    Other Instructions N/A

## 2020-09-25 NOTE — Progress Notes (Signed)
Virtual Visit via Telephone Note   This visit type was conducted due to national recommendations for restrictions regarding the COVID-19 Pandemic (e.g. social distancing) in an effort to limit this patient's exposure and mitigate transmission in our community.  Due to his co-morbid illnesses, this patient is at least at moderate risk for complications without adequate follow up.  This format is felt to be most appropriate for this patient at this time.  The patient did not have access to video technology/had technical difficulties with video requiring transitioning to audio format only (telephone).  All issues noted in this document were discussed and addressed.  No physical exam could be performed with this format.  Please refer to the patient's chart for his  consent to telehealth for [facility].    Date:  09/25/2020   ID:  [name], DOB 09/01/1955, MRN [id number] The patient was identified using 2 identifiers.  Patient Location: Home Provider Location: Office/Clinic   PCP:  Biagio Borg, MD   [place]  Cardiologist:  Kathlyn Sacramento, MD  Advanced Practice Provider:  No care team member to display Electrophysiologist:  None    Evaluation Performed:  Follow-Up Visit  Chief Complaint: Elevated blood pressure  History of Present Illness:    [name] is a 65 y.o. male was reached via phone visit for follow-up regarding PAD, coronary artery disease and uncontrolled hypertension. He has known history of insulin-requiring DM (dx'ed 1992),  Hyperlipidemia, hypertension and tobacco use. He is followed for right calf claudication due to occluded right SFA. Noninvasive evaluation in August 2015 showed an ABI of 0.44 on the right and normal on the left. Duplex showed long occlusion of right SFA from mid to distal segment with reconstitution in popliteal artery. There was significant left SFA stenosis.   He had atypical chest pain in late 2015. Nuclear  stress test showed a small inferolateral infarct with no significant ischemia. He was treated medically given that his chest pain resolved.  He quit smoking in December 2017.   He was hospitalized in October 2020 with sepsis secondary to UTI and pyelonephritis as well as hypertensive urgency.  His troponin was mildly elevated and peaked at 741.  CTA of the chest was negative for pulmonary embolism but he was noted to have coronary artery calcifications.  Echocardiogram during admission showed an EF of 60 to 65%, moderate LVH and no significant valvular abnormalities.  During his hospitalization, he was noted to have short runs of SVT in the setting of electrolyte abnormalities. The patient subsequently underwent outpatient cardiac CTA which showed coronary calcium score 412.  There was mild left main stenosis and mild first diagonal stenosis.  He had palpitations and thus was evaluated with a 14-day monitor which showed normal sinus rhythm with an average heart rate of 76 bpm.  He was noted to have frequent short runs of SVT as well as paroxysmal atrial fibrillation with a burden of less than 1%.  The longest episode lasted 3 minutes.  The patient was switched from amlodipine to diltiazem. The patient has known history of refractory hypertension.  Spironolactone was added last year.  He had worsening claudication and thus he underwent repeat Doppler studies that showed an ABI of 0.54 on the right and 1.03 on the left.  His blood pressure continues to be elevated in spite of of 4 different antihypertensive medications.  He denies any chest pain or worsening dyspnea.  He complains about his diabetic medications  being too expensive and did not help him lose any weight.  If anything, his weight is going up.  He reports stable leg claudication.    The patient does not have symptoms concerning for COVID-19 infection (fever, chills, cough, or new shortness of breath).    Past Medical History:  Diagnosis  Date  . Allergic rhinitis   . At risk for sleep apnea    STOP-BANG= 5   SENT TO PCP 03-14-2014  . CAD (coronary artery disease)    a. 07/2014 low risk MV; b. 07/2019 Cor CTA (FFR): LM 25-49 (nl), LAD mild prox/mid plaque (nl), D1 25-49p(nl w/ abnl FFR of 0.73 in inf branch), LCX/OM1 mild prox/mid plaque (nl), RCA nondominant, minimal Ca2+ plaque (nl), RPDA (mildly abnl @ 0.79)-->Med Rx..  . Cataract    surgically removed bilateral  . Condyloma acuminatum of penis   . Diabetic neuropathy ([place])   . Diastolic dysfunction    a. 05/2019 Echo: EF 60-65%, no rwam, mod LVH, impaired relaxation, nl RV size/fxn, trace MR, triv TR.  Marland Kitchen GERD (gastroesophageal reflux disease)   . History of bladder cancer    s/p  turbt  2013/   transitional cell carcinoma--   . History of condyloma acuminatum    PERINEAL AREA  W/ RECURRENCY  . History of gout   . Hyperlipidemia   . Hypertension   . Lower urinary tract symptoms (LUTS)   . PAF (paroxysmal atrial fibrillation) ([place])    a. 06/2019 Event monitor: PAF <1% burden. Longest 3 mins 36 secs.  . Productive cough   . PSVT (paroxysmal supraventricular tachycardia) ([place])    a. 06/2019 Event monitor: 112 episodes of SVT, longest 21 secs.  Marland Kitchen PVD (peripheral vascular disease) with claudication ([place])    a. 03/2014 LE art duplex: long segment occlusion of mid to distal R SFA; b. 03/2020 ABI: nl left and mildly improved R ABI->med rx.  . Smokers' cough ([place])   . Type 2 diabetes mellitus with insulin therapy ([place]) 1992   monitor by  dr ellsion  . Wears dentures    upper   Past Surgical History:  Procedure Laterality Date  . AXILLARY HIDRADENITIS EXCISION  1997  . CARDIOVASCULAR STRESS TEST  07-24-2014  dr Kathlyn Sacramento   Low risk lexiscan nuclear study with apical thinning and small inferolateral wall infarct at mid & basal level , no ischemia/  normal LVF and wall motion , ef 59%  . CATARACT EXTRACTION Left   . CATARACT EXTRACTION W/ INTRAOCULAR LENS IMPLANT  Right   . CO2 LASER APPLICATION N/A 6/64/4034   Procedure: CO2 LASER APPLICATION,PENIS, GROIN, ANUS;  Surgeon: [name], MD;  Location: [place];  Service: General;  Laterality: N/A;  . CO2 LASER APPLICATION N/A 74/25/9563   Procedure: CO2 LASER APPLICATION;  Surgeon: Kathie Rhodes, MD;  Location: [facility];  Service: Urology;  Laterality: N/A;  . CONDYLOMA EXCISION/FULGURATION N/A 05/21/2015   Procedure: CONDYLOMA REMOVAL;  Surgeon: Kathie Rhodes, MD;  Location[facility];  Service: Urology;  Laterality: N/A;  . HEMORRHOID SURGERY  10/24/2014   Procedure: HEMORRHOIDECTOMY;  Surgeon: Michael Boston, MD;  Location: [facility];  Service: General;;  . INCISION AND DRAINAGE ABSCESS Left 10/24/2014   Procedure: INCISION AND DRAINAGE ABSCESS;  Surgeon: Michael Boston, MD;  Location: [place];  Service: General;  Laterality: Left;  . INGUINAL HIDRADENITIS EXCISION  1998, 1999  . LASER ABLATION CONDOLAMATA N/A 03/20/2014  Procedure: EXAM UNDER ANESTHESIA, REMOVAL/ABLATION OF CONDYLOMATA PENIS,GROINS, ANUS, ANAL CANAL;  Surgeon: [name], MD;  Location: [place];  Service: General;  Laterality: N/A;  groin and anus  . LASER ABLATION CONDOLAMATA N/A 10/24/2014   Procedure: LASER ABLATION CONDOLAMATA;  Surgeon: Michael Boston, MD;  Location[facility];  Service: General;  Laterality: N/A;  . LASER ABLATION OF PENILE AND PERIANAL WARTS  07-29-2007  Dr. Johney Maine  . LEFT SHOULDER SURGERY  2003  . MASS EXCISION N/A 10/24/2014   Procedure: EXCISION OF PERINEAL MASS/SINUS;  Surgeon: Michael Boston, MD;  Location: [place];  Service: General;  Laterality: N/A;  . MOHS SURGERY     back  . MOHS SURGERY  2017   face  . MULTIPLE TOOTH EXTRACTIONS    . PERINEAL HIDRADENITIS EXCISION  1998, 1999  . TRANSURETHRAL RESECTION OF BLADDER TUMOR  05/21/2012   Procedure: TRANSURETHRAL  RESECTION OF BLADDER TUMOR (TURBT);  Surgeon: Claybon Jabs, MD;  Location[facility];  Service: Urology;  Laterality: N/A;        Current Meds  Medication Sig  . aspirin EC 81 MG tablet Take 1 tablet (81 mg total) by mouth daily.  Marland Kitchen atorvastatin (LIPITOR) 40 MG tablet Take 1 tablet by mouth once daily  . Blood Glucose Monitoring Suppl (ONE TOUCH ULTRA 2) w/Device KIT Use as directed to check blood sugars twice a day  . diltiazem (CARDIZEM CD) 360 MG 24 hr capsule Take 1 capsule (360 mg total) by mouth daily.  . Dulaglutide (TRULICITY) 6.19 JK/9.3OI SOPN Inject 0.75 mg into the skin once a week.  Marland Kitchen glucose blood (ONETOUCH ULTRA) test strip 1 each by Other route 2 (two) times daily. (Patient taking differently: 1 each by Other route daily.)  . hydrochlorothiazide (HYDRODIURIL) 25 MG tablet Take 1 tablet (25 mg total) by mouth daily.  . insulin glargine, 2 Unit Dial, (TOUJEO MAX SOLOSTAR) 300 UNIT/ML Solostar Pen Inject 270 Units into the skin every morning.  . irbesartan (AVAPRO) 300 MG tablet Take 1 tablet (300 mg total) by mouth daily.  . Lancets (ONETOUCH ULTRASOFT) lancets 1 each by Other route 2 (two) times daily. Dx E11.9 and Z79.4  . loratadine (CLARITIN) 10 MG tablet Take 10 mg by mouth daily as needed.   . pantoprazole (PROTONIX) 40 MG tablet TAKE 1 TABLET BY MOUTH  DAILY  . spironolactone (ALDACTONE) 25 MG tablet Take 1 tablet (25 mg total) by mouth daily.     Allergies:   Cefepime, Metoprolol, and Myrbetriq [mirabegron]   Social History   Tobacco Use  . Smoking status: Former Smoker    Packs/day: 1.00    Years: 41.00    Pack years: 41.00    Types: Cigars, Cigarettes    Quit date: 04/11/2016    Years since quitting: 4.4  . Smokeless tobacco: Never Used  Vaping Use  . Vaping Use: Former  Substance Use Topics  . Alcohol use: No    Alcohol/week: 0.0 standard drinks  . Drug use: No     Family Hx: The patient's family history includes Cancer in his  father; Diabetes in his maternal aunt; Hypertension in his mother. There is no history of Colon cancer, Esophageal cancer, Pancreatic cancer, Prostate cancer, Kidney disease, Liver disease, Lung cancer, Rectal cancer, or Stomach cancer.  ROS:   Please see the history of present illness.     All other systems reviewed and are negative.   Prior CV studies:  The following studies were reviewed today:    Labs/Other Tests and Data Reviewed:    EKG:  No ECG reviewed.  Recent Labs: 10/20/2019: ALT 37; Hemoglobin 14.3; Platelets 260.0; TSH 1.54 02/29/2020: BUN 20; Creatinine, Ser 1.06; Potassium 3.5; Sodium 137   Recent Lipid Panel Lab Results  Component Value Date/Time   CHOL 96 10/20/2019 02:42 PM   CHOL 151 06/16/2019 11:44 AM   TRIG 151.0 (H) 10/20/2019 02:42 PM   HDL 30.20 (L) 10/20/2019 02:42 PM   HDL 28 (L) 06/16/2019 11:44 AM   CHOLHDL 3 10/20/2019 02:42 PM   LDLCALC 35 10/20/2019 02:42 PM   LDLCALC 88 06/16/2019 11:44 AM   LDLDIRECT 67.0 06/15/2018 04:52 PM    Wt Readings from Last 3 Encounters:  09/25/20 261 lb (118.4 kg)  09/04/20 264 lb 3.2 oz (119.8 kg)  06/26/20 253 lb (114.8 kg)     Risk Assessment/Calculations:      Objective:    Vital Signs:  BP (!) 158/78   Pulse 77   Ht 5' 8" (1.727 m)   Wt 261 lb (118.4 kg)   SpO2 96%   BMI 39.68 kg/m    VITAL SIGNS:  reviewed  ASSESSMENT & PLAN:    1.  Peripheral arterial disease: Known occluded right SFA .stable right calf claudication.  Continue medical therapy.  2. Coronary artery disease involving native coronary arteries without angina: Cardiac CTA showed elevated calcium score as outlined above with mild left main and diagonal disease.  Recommend aggressive medical therapy.  3.  Paroxysmal supraventricular tachycardia: A. fib burden was not significant enough to justify anticoagulation.  Symptoms are controlled with diltiazem.  4. Essential hypertension: Blood pressure continues to be elevated in spite  of 4 different antihypertensive medications.  I requested renal artery duplex to evaluate for renal artery stenosis.  He wants to wait a few months before scheduling given Covid situation.  5.  Hyperlipidemia: Continue treatment with atorvastatin.  Most recent LDL was 35.        COVID-19 Education: The signs and symptoms of COVID-19 were discussed with the patient and how to seek care for testing (follow up with PCP or arrange E-visit).  The importance of social distancing was discussed today.  Time:   Today, I have spent 13 minutes with the patient with telehealth technology discussing the above problems.     Medication Adjustments/Labs and Tests Ordered: Current medicines are reviewed at length with the patient today.  Concerns regarding medicines are outlined above.   Tests Ordered: No orders of the defined types were placed in this encounter.   Medication Changes: No orders of the defined types were placed in this encounter.   Follow Up:  In Person in 4 month(s)  Signed, Kathlyn Sacramento, MD  09/25/2020 2:46 PM    [place]

## 2020-09-26 ENCOUNTER — Telehealth: Payer: Self-pay | Admitting: Cardiovascular Disease

## 2020-09-26 NOTE — Telephone Encounter (Signed)
L MOM to schedule 3 month renal ultrasound and follow up.

## 2020-10-24 ENCOUNTER — Other Ambulatory Visit: Payer: Self-pay

## 2020-10-25 ENCOUNTER — Encounter: Payer: Self-pay | Admitting: Internal Medicine

## 2020-10-25 ENCOUNTER — Ambulatory Visit (INDEPENDENT_AMBULATORY_CARE_PROVIDER_SITE_OTHER): Payer: Medicare Other | Admitting: Internal Medicine

## 2020-10-25 VITALS — BP 146/58 | HR 67 | Temp 98.2°F | Ht 68.0 in | Wt 263.6 lb

## 2020-10-25 DIAGNOSIS — E7800 Pure hypercholesterolemia, unspecified: Secondary | ICD-10-CM

## 2020-10-25 DIAGNOSIS — I700 Atherosclerosis of aorta: Secondary | ICD-10-CM | POA: Insufficient documentation

## 2020-10-25 DIAGNOSIS — E559 Vitamin D deficiency, unspecified: Secondary | ICD-10-CM

## 2020-10-25 DIAGNOSIS — E1151 Type 2 diabetes mellitus with diabetic peripheral angiopathy without gangrene: Secondary | ICD-10-CM | POA: Diagnosis not present

## 2020-10-25 DIAGNOSIS — Z794 Long term (current) use of insulin: Secondary | ICD-10-CM

## 2020-10-25 DIAGNOSIS — Z0001 Encounter for general adult medical examination with abnormal findings: Secondary | ICD-10-CM | POA: Diagnosis not present

## 2020-10-25 DIAGNOSIS — I10 Essential (primary) hypertension: Secondary | ICD-10-CM

## 2020-10-25 DIAGNOSIS — E538 Deficiency of other specified B group vitamins: Secondary | ICD-10-CM

## 2020-10-25 DIAGNOSIS — Z0000 Encounter for general adult medical examination without abnormal findings: Secondary | ICD-10-CM | POA: Diagnosis not present

## 2020-10-25 LAB — HEPATIC FUNCTION PANEL
ALT: 51 U/L (ref 0–53)
AST: 36 U/L (ref 0–37)
Albumin: 4.3 g/dL (ref 3.5–5.2)
Alkaline Phosphatase: 73 U/L (ref 39–117)
Bilirubin, Direct: 0.1 mg/dL (ref 0.0–0.3)
Total Bilirubin: 0.7 mg/dL (ref 0.2–1.2)
Total Protein: 7.7 g/dL (ref 6.0–8.3)

## 2020-10-25 LAB — BASIC METABOLIC PANEL
BUN: 18 mg/dL (ref 6–23)
CO2: 31 mEq/L (ref 19–32)
Calcium: 9.9 mg/dL (ref 8.4–10.5)
Chloride: 98 mEq/L (ref 96–112)
Creatinine, Ser: 1.2 mg/dL (ref 0.40–1.50)
GFR: 64.11 mL/min (ref >60.00)
Glucose, Bld: 126 mg/dL — ABNORMAL HIGH (ref 70–99)
Potassium: 4.2 mEq/L (ref 3.5–5.1)
Sodium: 138 mEq/L (ref 135–145)

## 2020-10-25 LAB — URINALYSIS, ROUTINE W REFLEX MICROSCOPIC
Bilirubin Urine: NEGATIVE
Hgb urine dipstick: NEGATIVE
Ketones, ur: NEGATIVE
Leukocytes,Ua: NEGATIVE
Nitrite: NEGATIVE
Specific Gravity, Urine: 1.01 (ref 1.000–1.030)
Urine Glucose: NEGATIVE
Urobilinogen, UA: 1 (ref 0.0–1.0)
pH: 7 (ref 5.0–8.0)

## 2020-10-25 LAB — LIPID PANEL
Cholesterol: 120 mg/dL (ref 0–200)
HDL: 33.6 mg/dL — ABNORMAL LOW (ref >39.00)
NonHDL: 86.71
Total CHOL/HDL Ratio: 4
Triglycerides: 206 mg/dL — ABNORMAL HIGH (ref 0.0–149.0)
VLDL: 41.2 mg/dL — ABNORMAL HIGH (ref 0.0–40.0)

## 2020-10-25 LAB — CBC WITH DIFFERENTIAL/PLATELET
Basophils Absolute: 0.1 10*3/uL (ref 0.0–0.1)
Basophils Relative: 1.1 % (ref 0.0–3.0)
Eosinophils Absolute: 0.8 10*3/uL — ABNORMAL HIGH (ref 0.0–0.7)
Eosinophils Relative: 6.1 % — ABNORMAL HIGH (ref 0.0–5.0)
HCT: 43.8 % (ref 39.0–52.0)
Hemoglobin: 15.3 g/dL (ref 13.0–17.0)
Lymphocytes Relative: 21 % (ref 12.0–46.0)
Lymphs Abs: 2.7 10*3/uL (ref 0.7–4.0)
MCHC: 34.9 g/dL (ref 30.0–36.0)
MCV: 92.9 fl (ref 78.0–100.0)
Monocytes Absolute: 1 10*3/uL (ref 0.1–1.0)
Monocytes Relative: 7.7 % (ref 3.0–12.0)
Neutro Abs: 8.2 10*3/uL — ABNORMAL HIGH (ref 1.4–7.7)
Neutrophils Relative %: 64.1 % (ref 43.0–77.0)
Platelets: 295 10*3/uL (ref 150.0–400.0)
RBC: 4.72 Mil/uL (ref 4.22–5.81)
RDW: 12.9 % (ref 11.5–15.5)
WBC: 12.9 10*3/uL — ABNORMAL HIGH (ref 4.0–10.5)

## 2020-10-25 LAB — TSH: TSH: 3.15 u[IU]/mL (ref 0.35–4.50)

## 2020-10-25 LAB — VITAMIN D 25 HYDROXY (VIT D DEFICIENCY, FRACTURES): VITD: 35.06 ng/mL (ref 30.00–100.00)

## 2020-10-25 LAB — VITAMIN B12: Vitamin B-12: 483 pg/mL (ref 211–911)

## 2020-10-25 LAB — LDL CHOLESTEROL, DIRECT: Direct LDL: 62 mg/dL

## 2020-10-25 LAB — PSA: PSA: 1.76 ng/mL (ref 0.10–4.00)

## 2020-10-25 NOTE — Patient Instructions (Signed)
Please take OTC Vitamin D3 at 2000 units per day, indefinitely  Please continue all other medications as before, and refills have been done if requested.  Please have the pharmacy call with any other refills you may need.  Please continue your efforts at being more active, low cholesterol diet, and weight control.  You are otherwise up to date with prevention measures today.  Please keep your appointments with your specialists as you may have planned  Please go to the LAB at the blood drawing area for the tests to be done  You will be contacted by phone if any changes need to be made immediately.  Otherwise, you will receive a letter about your results with an explanation, but please check with MyChart first.  Please remember to sign up for MyChart if you have not done so, as this will be important to you in the future with finding out test results, communicating by private email, and scheduling acute appointments online when needed.  Please make an Appointment to return in 6 months, or sooner if needed 

## 2020-10-25 NOTE — Progress Notes (Signed)
Patient ID: [name], male   DOB: [birth year], 65 y.o.   MRN: [id number]         Chief Complaint:: wellness exam and low VIt D, DM, aortic atherosclerosis, htn, hld       HPI:  [name] is a 65 y.o. male here for wellness exam; plans to make eye appt soon.  O/w up to date with preventive referrals an immunizations.                        Also not taking Vit D.   Pt denies polydipsia, polyuria,  Pt states overall good compliance with meds, trying to follow lower cholesterol, diabetic diet, wt overall stable but little exercise however.   Denies worsening new focal neuro s/s.  Pt denies chest pain, increased sob or doe, wheezing, orthopnea, PND, [name], palpitations, dizziness or syncope   Pt denies fever, wt loss, night sweats, loss of appetite, or other constitutional symptoms  No other new compalints  Pt is adamatn BP at home is < 140/90.  Has seen endo and cardiology recently.    Wt Readings from Last 3 Encounters:  10/25/20 263 lb 9.6 oz (119.6 kg)  09/25/20 261 lb (118.4 kg)  09/04/20 264 lb 3.2 oz (119.8 kg)   BP Readings from Last 3 Encounters:  10/25/20 (!) 146/58  09/25/20 (!) 158/78  09/04/20 (!) 160/60   Immunization History  Administered Date(s) Administered  . Fluad Quad(high Dose 65+) 04/21/2019  . Influenza Split 07/07/2011, 06/08/2012  . Influenza Whole 05/04/2008, 05/22/2009, 05/02/2010  . Influenza,inj,Quad PF,6+ Mos 06/06/2013, 05/05/2014, 07/02/2015, 05/05/2016, 06/15/2018, 04/26/2020  . PFIZER(Purple Top)SARS-COV-2 Vaccination 11/23/2019, 12/14/2019  . Pneumococcal Polysaccharide-23 01/10/1996, 05/05/2016  . Td 02/09/2000  . Tdap 02/10/2011, 06/05/2016   Health Maintenance Due  Topic Date Due  . OPHTHALMOLOGY EXAM  05/12/2019      Past Medical History:  Diagnosis Date  . Allergic rhinitis   . At risk for sleep apnea    STOP-BANG= 5   SENT TO PCP 03-14-2014  . CAD (coronary artery disease)    a. 07/2014 low risk MV; b. 07/2019 Cor  CTA (FFR): LM 25-49 (nl), LAD mild prox/mid plaque (nl), D1 25-49p(nl w/ abnl FFR of 0.73 in inf branch), LCX/OM1 mild prox/mid plaque (nl), RCA nondominant, minimal Ca2+ plaque (nl), RPDA (mildly abnl @ 0.79)-->Med Rx..  . Cataract    surgically removed bilateral  . Condyloma acuminatum of penis   . Diabetic neuropathy ([place])   . Diastolic dysfunction    a. 05/2019 Echo: EF 60-65%, no rwam, mod LVH, impaired relaxation, nl RV size/fxn, trace MR, triv TR.  Marland Kitchen GERD (gastroesophageal reflux disease)   . History of bladder cancer    s/p  turbt  2013/   transitional cell carcinoma--   . History of condyloma acuminatum    PERINEAL AREA  W/ RECURRENCY  . History of gout   . Hyperlipidemia   . Hypertension   . Lower urinary tract symptoms (LUTS)   . PAF (paroxysmal atrial fibrillation) ([place])    a. 06/2019 Event monitor: PAF <1% burden. Longest 3 mins 36 secs.  . Productive cough   . PSVT (paroxysmal supraventricular tachycardia) ([place])    a. 06/2019 Event monitor: 112 episodes of SVT, longest 21 secs.  Marland Kitchen PVD (peripheral vascular disease) with claudication ([place])    a. 03/2014 LE art duplex: long segment occlusion of mid to distal R SFA; b. 03/2020 ABI: nl left and  mildly improved R ABI->med rx.  . Smokers' cough ([place])   . Type 2 diabetes mellitus with insulin therapy ([place]) 1992   monitor by  dr ellsion  . Wears dentures    upper   Past Surgical History:  Procedure Laterality Date  . AXILLARY HIDRADENITIS EXCISION  1997  . CARDIOVASCULAR STRESS TEST  07-24-2014  dr Kathlyn [place]   Low risk lexiscan nuclear study with apical thinning and small inferolateral wall infarct at mid & basal level , no ischemia/  normal LVF and wall motion , ef 59%  . CATARACT EXTRACTION Left   . CATARACT EXTRACTION W/ INTRAOCULAR LENS IMPLANT Right   . CO2 LASER APPLICATION N/A 1/93/7902   Procedure: CO2 LASER APPLICATION,PENIS, GROIN, ANUS;  Surgeon: [name], MD;  Location: [place];   Service: General;  Laterality: N/A;  . CO2 LASER APPLICATION N/A 40/97/3532   Procedure: CO2 LASER APPLICATION;  Surgeon: Kathie Rhodes, MD;  Location: [facility];  Service: Urology;  Laterality: N/A;  . CONDYLOMA EXCISION/FULGURATION N/A 05/21/2015   Procedure: CONDYLOMA REMOVAL;  Surgeon: Kathie Rhodes, MD;  Location: [facility];  Service: Urology;  Laterality: N/A;  . HEMORRHOID SURGERY  10/24/2014   Procedure: HEMORRHOIDECTOMY;  Surgeon: Michael Boston, MD;  Location: [facility];  Service: General;;  . INCISION AND DRAINAGE ABSCESS Left 10/24/2014   Procedure: INCISION AND DRAINAGE ABSCESS;  Surgeon: Michael Boston, MD;  Location: [place];  Service: General;  Laterality: Left;  . INGUINAL HIDRADENITIS EXCISION  1998, 1999  . LASER ABLATION CONDOLAMATA N/A 03/20/2014   Procedure: EXAM UNDER ANESTHESIA, REMOVAL/ABLATION OF CONDYLOMATA PENIS,GROINS, ANUS, ANAL CANAL;  Surgeon: [name], MD;  Location: [place];  Service: General;  Laterality: N/A;  groin and anus  . LASER ABLATION CONDOLAMATA N/A 10/24/2014   Procedure: LASER ABLATION CONDOLAMATA;  Surgeon: Michael Boston, MD;  Location: [facility];  Service: General;  Laterality: N/A;  . LASER ABLATION OF PENILE AND PERIANAL WARTS  07-29-2007  Dr. Johney Maine  . LEFT SHOULDER SURGERY  2003  . MASS EXCISION N/A 10/24/2014   Procedure: EXCISION OF PERINEAL MASS/SINUS;  Surgeon: Michael Boston, MD;  Location: [place];  Service: General;  Laterality: N/A;  . MOHS SURGERY     back  . MOHS SURGERY  2017   face  . MULTIPLE TOOTH EXTRACTIONS    . PERINEAL HIDRADENITIS EXCISION  1998, 1999  . TRANSURETHRAL RESECTION OF BLADDER TUMOR  05/21/2012   Procedure: TRANSURETHRAL RESECTION OF BLADDER TUMOR (TURBT);  Surgeon: Claybon Jabs, MD;  Location: [facility];  Service: Urology;  Laterality: N/A;       reports that he quit  smoking about 4 years ago. His smoking use included cigars and cigarettes. He has a 41.00 pack-year smoking history. He has never used smokeless tobacco. He reports that he does not drink alcohol and does not use drugs. family history includes Cancer in his father; Diabetes in his maternal aunt; Hypertension in his mother. Allergies  Allergen Reactions  . Cefepime Hives    Had allergic reaction to one of these 3 agents, unclear which one  . Metoprolol Hives    Had allergic reaction to one of these 3 agents, unclear which one  *Per RN, highly likely this is the cause of allergic rxn  . Myrbetriq [Mirabegron] Hives    Had allergic reaction to one of these 3 agents, unclear which one  Current Outpatient Medications on File Prior to Visit  Medication Sig Dispense Refill  . aspirin EC 81 MG tablet Take 1 tablet (81 mg total) by mouth daily.    Marland Kitchen atorvastatin (LIPITOR) 40 MG tablet Take 1 tablet by mouth once daily 90 tablet 0  . Blood Glucose Monitoring Suppl (ONE TOUCH ULTRA 2) w/Device KIT Use as directed to check blood sugars twice a day 1 kit 0  . diltiazem (CARDIZEM CD) 360 MG 24 hr capsule Take 1 capsule (360 mg total) by mouth daily. 90 capsule 3  . Dulaglutide (TRULICITY) 9.47 ML/4.6TK SOPN Inject 0.75 mg into the skin once a week. 6 mL 3  . glucose blood (ONETOUCH ULTRA) test strip 1 each by Other route 2 (two) times daily. (Patient taking differently: 1 each by Other route daily.) 100 each 12  . hydrochlorothiazide (HYDRODIURIL) 25 MG tablet Take 1 tablet (25 mg total) by mouth daily. 90 tablet 3  . insulin glargine, 2 Unit Dial, (TOUJEO MAX SOLOSTAR) 300 UNIT/ML Solostar Pen Inject 270 Units into the skin every morning. 90 mL 3  . Lancets (ONETOUCH ULTRASOFT) lancets 1 each by Other route 2 (two) times daily. Dx E11.9 and Z79.4 180 each 3  . loratadine (CLARITIN) 10 MG tablet Take 10 mg by mouth daily as needed.     Marland Kitchen losartan (COZAAR) 100 MG tablet losartan 100 mg tablet    .  pantoprazole (PROTONIX) 40 MG tablet TAKE 1 TABLET BY MOUTH  DAILY 90 tablet 3  . spironolactone (ALDACTONE) 25 MG tablet Take 1 tablet (25 mg total) by mouth daily. 90 tablet 3   No current facility-administered medications on file prior to visit.        ROS:  All others reviewed and negative.  Objective        PE:  BP (!) 146/58 (BP Location: Right Arm, Patient Position: Sitting, Cuff Size: Large)   Pulse 67   Temp 98.2 F (36.8 C) (Oral)   Ht _0  (1.727 m)   Wt 263 lb 9.6 oz (119.6 kg)   SpO2 97%   BMI 40.08 kg/m                 Constitutional: Pt appears in NAD               HENT: Head: NCAT.                Right Ear: External ear normal.                 Left Ear: External ear normal.                Eyes: . Pupils are equal, round, and reactive to light. Conjunctivae and EOM are normal               Nose: without d/c or deformity               Neck: Neck supple. Gross normal ROM               Cardiovascular: Normal rate and regular rhythm.                 Pulmonary/Chest: Effort normal and breath sounds without rales or wheezing.                Abd:  Soft, NT, ND, + BS, no organomegaly               Neurological: Pt is alert. At  baseline orientation, motor grossly intact               Skin: Skin is warm. No rashes, no other new lesions, LE edema - none               Psychiatric: Pt behavior is normal without agitation   Micro: none  Cardiac tracings I have personally interpreted today:  none  Pertinent Radiological findings (summarize): none   Lab Results  Component Value Date   WBC 12.9 (H) 10/25/2020   HGB 15.3 10/25/2020   HCT 43.8 10/25/2020   PLT 295.0 10/25/2020   GLUCOSE 126 (H) 10/25/2020   CHOL 120 10/25/2020   TRIG 206.0 (H) 10/25/2020   HDL 33.60 (L) 10/25/2020   LDLDIRECT 62.0 10/25/2020   LDLCALC 35 10/20/2019   ALT 51 10/25/2020   AST 36 10/25/2020   NA 138 10/25/2020   K 4.2 10/25/2020   CL 98 10/25/2020   CREATININE 1.20 10/25/2020   BUN  18 10/25/2020   CO2 31 10/25/2020   TSH 3.15 10/25/2020   PSA 1.76 10/25/2020   INR 1.1 06/04/2019   HGBA1C 9.4 (A) 09/04/2020   MICROALBUR 18.0 (H) 10/25/2020   Assessment/Plan:  [name] is a 65 y.o. White or Caucasian [1] male with  has a past medical history of Allergic rhinitis, At risk for sleep apnea, CAD (coronary artery disease), Cataract, Condyloma acuminatum of penis, Diabetic neuropathy ([place]), Diastolic dysfunction, GERD (gastroesophageal reflux disease), History of bladder cancer, History of condyloma acuminatum, History of gout, Hyperlipidemia, Hypertension, Lower urinary tract symptoms (LUTS), PAF (paroxysmal atrial fibrillation) ([place]), Productive cough, PSVT (paroxysmal supraventricular tachycardia) ([place]), PVD (peripheral vascular disease) with claudication ([place]), Smokers' cough ([place]), Type 2 diabetes mellitus with insulin therapy ([place]) (1992), and Wears dentures.  Encounter for well adult exam with abnormal findings Age and sex appropriate education and counseling updated with regular exercise and diet Referrals for preventative services  - plans to make eye appt on his own soon Immunizations addressed - none needed Smoking counseling  - none needed Evidence for depression or other mood disorder - none significant Most recent labs reviewed. I have personally reviewed and have noted: 1) the patient's medical and social history 2) The patient's current medications and supplements 3) The patient's height, weight, and BMI have been recorded in the chart   Aortic atherosclerosis ([place]) To continue lipitor 40 and low chol diet  Diabetes ([place]) Lab Results  Component Value Date   HGBA1C 9.4 (A) 09/04/2020   Stable, pt to continue current medical treatment  - trulicity, toujeo, and f/u endo   Essential hypertension BP Readings from Last 3 Encounters:  10/25/20 (!) 146/58  09/25/20 (!) 158/78  09/04/20 (!) 160/60   Stable, pt to continue medical treatment losartan,  hct, aldactone, cardizem   HYPERCHOLESTEROLEMIA Lab Results  Component Value Date   LDLCALC 35 10/20/2019   Stable, pt to continue current statin lipitor 40   Vitamin D deficiency Last vitamin D Lab Results  Component Value Date   VD25OH 35.06 10/25/2020   Low normal, to add vit d3 2000 u qd, oral replacement   Followup: Return in about 6 months (around 04/27/2021).  Cathlean Cower, MD 10/28/2020 8:44 PM [facility]

## 2020-10-26 ENCOUNTER — Encounter: Payer: Self-pay | Admitting: Internal Medicine

## 2020-10-26 LAB — MICROALBUMIN / CREATININE URINE RATIO
Creatinine,U: 78.6 mg/dL
Microalb Creat Ratio: 22.8 mg/g (ref 0.0–30.0)
Microalb, Ur: 18 mg/dL — ABNORMAL HIGH (ref 0.0–1.9)

## 2020-10-28 ENCOUNTER — Encounter: Payer: Self-pay | Admitting: Internal Medicine

## 2020-10-28 NOTE — Assessment & Plan Note (Signed)
To continue lipitor 40 and low chol diet

## 2020-10-28 NOTE — Assessment & Plan Note (Signed)
BP Readings from Last 3 Encounters:  10/25/20 (!) 146/58  09/25/20 (!) 158/78  09/04/20 (!) 160/60   Stable, pt to continue medical treatment losartan, hct, aldactone, cardizem

## 2020-10-28 NOTE — Assessment & Plan Note (Signed)
Lab Results  Component Value Date   HGBA1C 9.4 (A) 09/04/2020   Stable, pt to continue current medical treatment  - trulicity, toujeo, and f/u endo

## 2020-10-28 NOTE — Assessment & Plan Note (Signed)
Last vitamin D Lab Results  Component Value Date   VD25OH 35.06 10/25/2020   Low normal, to add vit d3 2000 u qd, oral replacement

## 2020-10-28 NOTE — Assessment & Plan Note (Signed)
Lab Results  Component Value Date   LDLCALC 35 10/20/2019   Stable, pt to continue current statin lipitor 40

## 2020-10-28 NOTE — Assessment & Plan Note (Signed)
Age and sex appropriate education and counseling updated with regular exercise and diet Referrals for preventative services  - plans to make eye appt on his own soon Immunizations addressed - none needed Smoking counseling  - none needed Evidence for depression or other mood disorder - none significant Most recent labs reviewed. I have personally reviewed and have noted: 1) the patient's medical and social history 2) The patient's current medications and supplements 3) The patient's height, weight, and BMI have been recorded in the chart

## 2020-11-01 ENCOUNTER — Telehealth: Payer: Self-pay | Admitting: Internal Medicine

## 2020-11-01 NOTE — Telephone Encounter (Signed)
Patient dropped off renewal parking placard.   Form has been completed and placed in providers box to review and sign.

## 2020-11-02 ENCOUNTER — Other Ambulatory Visit: Payer: Self-pay | Admitting: Internal Medicine

## 2020-11-02 NOTE — Telephone Encounter (Signed)
Form has been signed, Copy sent to scan.   Patient's VM is full.  Form is ready to be picked up, If patient would like it mailed we can.

## 2020-11-06 ENCOUNTER — Ambulatory Visit: Payer: Medicare Other | Admitting: Endocrinology

## 2020-11-08 ENCOUNTER — Ambulatory Visit: Payer: Medicare Other | Admitting: Endocrinology

## 2020-11-09 ENCOUNTER — Ambulatory Visit (INDEPENDENT_AMBULATORY_CARE_PROVIDER_SITE_OTHER): Payer: Medicare Other | Admitting: Endocrinology

## 2020-11-09 ENCOUNTER — Other Ambulatory Visit: Payer: Self-pay

## 2020-11-09 VITALS — BP 160/60 | HR 73 | Ht 68.0 in | Wt 263.4 lb

## 2020-11-09 DIAGNOSIS — Z794 Long term (current) use of insulin: Secondary | ICD-10-CM | POA: Diagnosis not present

## 2020-11-09 DIAGNOSIS — E1151 Type 2 diabetes mellitus with diabetic peripheral angiopathy without gangrene: Secondary | ICD-10-CM

## 2020-11-09 LAB — POCT GLYCOSYLATED HEMOGLOBIN (HGB A1C): Hemoglobin A1C: 8.7 % — AB (ref 4.0–5.6)

## 2020-11-09 MED ORDER — TRULICITY 1.5 MG/0.5ML ~~LOC~~ SOAJ: 1.5000 mg | SUBCUTANEOUS | 3 refills | Status: DC

## 2020-11-09 NOTE — Progress Notes (Signed)
Subjective:    Patient ID: [name], male    DOB: 1956/06/23, 65 y.o.   MRN: [id number]  HPI Pt returns for f/u of diabetes mellitus:  DM type: Insulin-requiring type 2.   Dx'ed: 8875 Complications: PAD, CRI, and PN.   Therapy: insulin since 2009.   DKA: never Severe hypoglycemia: never.   Pancreatitis: never.  SDOH: none.    Other: he takes a QD insulin regimen, after poor results with more frequent injections; he was changed to 70/30, due to pattern of cbg's, but he did poorly on this, too.   Interval history: Meter is downloaded today, and the printout is scanned into the record.  cbg's vary from 101-400.  There is no trend throughout the day.  Past Medical History:  Diagnosis Date  . Allergic rhinitis   . At risk for sleep apnea    STOP-BANG= 5   SENT TO PCP 03-14-2014  . CAD (coronary artery disease)    a. 07/2014 low risk MV; b. 07/2019 Cor CTA (FFR): LM 25-49 (nl), LAD mild prox/mid plaque (nl), D1 25-49p(nl w/ abnl FFR of 0.73 in inf branch), LCX/OM1 mild prox/mid plaque (nl), RCA nondominant, minimal Ca2+ plaque (nl), RPDA (mildly abnl @ 0.79)-->Med Rx..  . Cataract    surgically removed bilateral  . Condyloma acuminatum of penis   . Diabetic neuropathy ([place])   . Diastolic dysfunction    a. 05/2019 Echo: EF 60-65%, no rwam, mod LVH, impaired relaxation, nl RV size/fxn, trace MR, triv TR.  Marland Kitchen GERD (gastroesophageal reflux disease)   . History of bladder cancer    s/p  turbt  2013/   transitional cell carcinoma--   . History of condyloma acuminatum    PERINEAL AREA  W/ RECURRENCY  . History of gout   . Hyperlipidemia   . Hypertension   . Lower urinary tract symptoms (LUTS)   . PAF (paroxysmal atrial fibrillation) ([place])    a. 06/2019 Event monitor: PAF <1% burden. Longest 3 mins 36 secs.  . Productive cough   . PSVT (paroxysmal supraventricular tachycardia) ([place])    a. 06/2019 Event monitor: 112 episodes of SVT, longest 21 secs.  Marland Kitchen PVD (peripheral vascular  disease) with claudication ([place])    a. 03/2014 LE art duplex: long segment occlusion of mid to distal R SFA; b. 03/2020 ABI: nl left and mildly improved R ABI->med rx.  . Smokers' cough ([place])   . Type 2 diabetes mellitus with insulin therapy ([place]) 1992   monitor by  dr ellsion  . Wears dentures    upper    Past Surgical History:  Procedure Laterality Date  . AXILLARY HIDRADENITIS EXCISION  1997  . CARDIOVASCULAR STRESS TEST  07-24-2014  dr Kathlyn [place]   Low risk lexiscan nuclear study with apical thinning and small inferolateral wall infarct at mid & basal level , no ischemia/  normal LVF and wall motion , ef 59%  . CATARACT EXTRACTION Left   . CATARACT EXTRACTION W/ INTRAOCULAR LENS IMPLANT Right   . CO2 LASER APPLICATION N/A 7/97/2820   Procedure: CO2 LASER APPLICATION,PENIS, GROIN, ANUS;  Surgeon: [name], MD;  Location: [place];  Service: General;  Laterality: N/A;  . CO2 LASER APPLICATION N/A 60/15/6153   Procedure: CO2 LASER APPLICATION;  Surgeon: Kathie Rhodes, MD;  Location: [facility];  Service: Urology;  Laterality: N/A;  . CONDYLOMA EXCISION/FULGURATION N/A 05/21/2015   Procedure: CONDYLOMA REMOVAL;  Surgeon: Kathie Rhodes, MD;  Location: [facility];  Service: Urology;  Laterality: N/A;  . HEMORRHOID SURGERY  10/24/2014   Procedure: HEMORRHOIDECTOMY;  Surgeon: Michael Boston, MD;  Location[facility];  Service: General;;  . INCISION AND DRAINAGE ABSCESS Left 10/24/2014   Procedure: INCISION AND DRAINAGE ABSCESS;  Surgeon: Michael Boston, MD;  Location: [place];  Service: General;  Laterality: Left;  . INGUINAL HIDRADENITIS EXCISION  1998, 1999  . LASER ABLATION CONDOLAMATA N/A 03/20/2014   Procedure: EXAM UNDER ANESTHESIA, REMOVAL/ABLATION OF CONDYLOMATA PENIS,GROINS, ANUS, ANAL CANAL;  Surgeon: [name], MD;  Location: [place];  Service: General;  Laterality: N/A;   groin and anus  . LASER ABLATION CONDOLAMATA N/A 10/24/2014   Procedure: LASER ABLATION CONDOLAMATA;  Surgeon: Michael Boston, MD;  Location: [facility];  Service: General;  Laterality: N/A;  . LASER ABLATION OF PENILE AND PERIANAL WARTS  07-29-2007  Dr. Johney Maine  . LEFT SHOULDER SURGERY  2003  . MASS EXCISION N/A 10/24/2014   Procedure: EXCISION OF PERINEAL MASS/SINUS;  Surgeon: Michael Boston, MD;  Location: [place];  Service: General;  Laterality: N/A;  . MOHS SURGERY     back  . MOHS SURGERY  2017   face  . MULTIPLE TOOTH EXTRACTIONS    . PERINEAL HIDRADENITIS EXCISION  1998, 1999  . TRANSURETHRAL RESECTION OF BLADDER TUMOR  05/21/2012   Procedure: TRANSURETHRAL RESECTION OF BLADDER TUMOR (TURBT);  Surgeon: Claybon Jabs, MD;  Location: [facility];  Service: Urology;  Laterality: N/A;       Social History   Socioeconomic History  . Marital status: Married    Spouse name: Not on file  . Number of children: Not on file  . Years of education: Not on file  . Highest education level: Not on file  Occupational History  . Occupation: Disabled  Tobacco Use  . Smoking status: Former Smoker    Packs/day: 1.00    Years: 41.00    Pack years: 41.00    Types: Cigars, Cigarettes    Quit date: 04/11/2016    Years since quitting: 4.5  . Smokeless tobacco: Never Used  Vaping Use  . Vaping Use: Former  Substance and Sexual Activity  . Alcohol use: No    Alcohol/week: 0.0 standard drinks  . Drug use: No  . Sexual activity: Not on file  Other Topics Concern  . Not on file  Social History Narrative  . Not on file   Social Determinants of Health   Financial Resource Strain: Low Risk   . Difficulty of Paying Living Expenses: Not hard at all  Food Insecurity: No Food Insecurity  . Worried About Charity fundraiser in the Last Year: Never true  . Ran Out of Food in the Last Year: Never true  Transportation Needs: No Transportation Needs  .  Lack of Transportation (Medical): No  . Lack of Transportation (Non-Medical): No  Physical Activity: Inactive  . Days of Exercise per Week: 0 days  . Minutes of Exercise per Session: 0 min  Stress: No Stress Concern Present  . Feeling of Stress : Not at all  Social Connections: Moderately Isolated  . Frequency of Communication with Friends and Family: More than three times a week  . Frequency of Social Gatherings with Friends and Family: Never  . Attends Religious Services: Never  . Active Member of Clubs or Organizations: No  . Attends Archivist Meetings: Never  . Marital Status: Married  Intimate Partner Violence: Not on file    Current Outpatient Medications on File Prior to Visit  Medication Sig Dispense Refill  . aspirin EC 81 MG tablet Take 1 tablet (81 mg total) by mouth daily.    Marland Kitchen atorvastatin (LIPITOR) 40 MG tablet Take 1 tablet by mouth once daily 90 tablet 0  . Blood Glucose Monitoring Suppl (ONE TOUCH ULTRA 2) w/Device KIT Use as directed to check blood sugars twice a day 1 kit 0  . diltiazem (CARDIZEM CD) 360 MG 24 hr capsule Take 1 capsule (360 mg total) by mouth daily. 90 capsule 3  . glucose blood (ONETOUCH ULTRA) test strip 1 each by Other route 2 (two) times daily. (Patient taking differently: 1 each by Other route daily.) 100 each 12  . hydrochlorothiazide (HYDRODIURIL) 25 MG tablet Take 1 tablet (25 mg total) by mouth daily. 90 tablet 3  . insulin glargine, 2 Unit Dial, (TOUJEO MAX SOLOSTAR) 300 UNIT/ML Solostar Pen Inject 270 Units into the skin every morning. 90 mL 3  . Lancets (ONETOUCH ULTRASOFT) lancets 1 each by Other route 2 (two) times daily. Dx E11.9 and Z79.4 180 each 3  . loratadine (CLARITIN) 10 MG tablet Take 10 mg by mouth daily as needed.     Marland Kitchen losartan (COZAAR) 100 MG tablet losartan 100 mg tablet    . pantoprazole (PROTONIX) 40 MG tablet TAKE 1 TABLET BY MOUTH  DAILY 90 tablet 3  . spironolactone (ALDACTONE) 25 MG tablet Take 1 tablet (25  mg total) by mouth daily. 90 tablet 3   No current facility-administered medications on file prior to visit.    Allergies  Allergen Reactions  . Cefepime Hives    Had allergic reaction to one of these 3 agents, unclear which one  . Metoprolol Hives    Had allergic reaction to one of these 3 agents, unclear which one  *Per RN, highly likely this is the cause of allergic rxn  . Myrbetriq [Mirabegron] Hives    Had allergic reaction to one of these 3 agents, unclear which one    Family History  Problem Relation Age of Onset  . Hypertension Mother   . Cancer Father        lung ca  . Diabetes Maternal Aunt        x 2  . Colon cancer Neg Hx   . Esophageal cancer Neg Hx   . Pancreatic cancer Neg Hx   . Prostate cancer Neg Hx   . Kidney disease Neg Hx   . Liver disease Neg Hx   . Lung cancer Neg Hx   . Rectal cancer Neg Hx   . Stomach cancer Neg Hx     BP (!) 160/60 (BP Location: Right Arm, Patient Position: Sitting, Cuff Size: Large)   Pulse 73   Ht 5' 8"  (1.727 m)   Wt 263 lb 6.4 oz (119.5 kg)   SpO2 97%   BMI 40.05 kg/m    Review of Systems Denies n/v    Objective:   Physical Exam VITAL SIGNS:  See vs page GENERAL: no distress Pulses: dorsalis pedis intact bilat.   MSK: no deformity of the feet CV: 2+ bilat leg edema, and bilat vv's.  Skin:  no ulcer on the feet, but there are bilat heavy calluses.  normal color and temp on the feet.   Neuro: sensation is intact to touch on the feet.   Ext: there is bilateral onychomycosis of the toenails.    A1c=8.7%  Assessment & Plan:  Insulin-requiring type 2 DM, with PN: uncontrolled.  HTN: is noted today.    Patient Instructions  Your blood pressure is high today.  Please see your primary care provider soon, to have it rechecked.   please take 270 units of Toujeo each morning.   I have sent a prescription to your pharmacy, to increase the Trulicity.   check your blood sugar twice a day.  vary the time of day  when you check, between before the 3 meals, and at bedtime.  also check if you have symptoms of your blood sugar being too high or too low.  please keep a record of the readings and bring it to your next appointment here (or you can bring the meter itself).  You can write it on any piece of paper.  please call us sooner if your blood sugar goes below 70, or if you have a lot of readings over 200.   Please come back for a follow-up appointment in 2 months.

## 2020-11-09 NOTE — Patient Instructions (Addendum)
Your blood pressure is high today.  Please see your primary care provider soon, to have it rechecked.   please take 270 units of Toujeo each morning.   I have sent a prescription to your pharmacy, to increase the Trulicity.   check your blood sugar twice a day.  vary the time of day when you check, between before the 3 meals, and at bedtime.  also check if you have symptoms of your blood sugar being too high or too low.  please keep a record of the readings and bring it to your next appointment here (or you can bring the meter itself).  You can write it on any piece of paper.  please call us sooner if your blood sugar goes below 70, or if you have a lot of readings over 200.   Please come back for a follow-up appointment in 2 months.

## 2020-11-13 DIAGNOSIS — H524 Presbyopia: Secondary | ICD-10-CM | POA: Diagnosis not present

## 2020-11-13 DIAGNOSIS — H43821 Vitreomacular adhesion, right eye: Secondary | ICD-10-CM | POA: Diagnosis not present

## 2020-11-13 DIAGNOSIS — E113293 Type 2 diabetes mellitus with mild nonproliferative diabetic retinopathy without macular edema, bilateral: Secondary | ICD-10-CM | POA: Diagnosis not present

## 2020-11-13 DIAGNOSIS — Z961 Presence of intraocular lens: Secondary | ICD-10-CM | POA: Diagnosis not present

## 2020-11-13 LAB — HM DIABETES EYE EXAM

## 2020-11-15 ENCOUNTER — Telehealth: Payer: Self-pay | Admitting: Endocrinology

## 2020-11-15 NOTE — Telephone Encounter (Signed)
Do you want me to send a prescription for alternative Ozempic?

## 2020-11-15 NOTE — Telephone Encounter (Signed)
New message    Pt c/o medication issue:  1. Name of Medication: Dulaglutide (TRULICITY) 1.5 NF/6.2ZH SOPN  2. How are you currently taking this medication (dosage and times per day)? Once a week    3. Are you having a reaction (difficulty breathing--STAT)? No   4. What is your medication issue? $ 487.00 cost -  have two week left of prescription - patient will not continue to take medication due to cost

## 2020-11-15 NOTE — Telephone Encounter (Signed)
Please Advise

## 2020-11-17 NOTE — Telephone Encounter (Signed)
FYI... Spoke with pt and he stated that he did not want anything else sent in right now until he sees you again in May. He stated that his insulin is already expensive and he could not afford it.

## 2020-11-21 ENCOUNTER — Telehealth: Payer: Self-pay | Admitting: Cardiovascular Disease

## 2020-11-21 NOTE — Telephone Encounter (Signed)
[employer] Rn is calling in regarding BP parameters  Patient has been reporting readings of   4/1 167/76 4/4 172/74 4/6 153/76 4/8 165/75 4/11 160/78 4/13 166/70 States patient is asymptomatic and [employer] is wanting to confirm Dr. Fletcher Anon is aware of patients current BP readings Juliann Pulse can be reached at [phone] ext [phone]

## 2020-11-21 NOTE — Telephone Encounter (Signed)
BP readings fwd to Dr. Fletcher Anon for review.

## 2020-11-26 NOTE — Telephone Encounter (Signed)
We need to proceed with renal artery duplex to evaluate for possible renal artery stenosis as a cause of his uncontrolled hypertension in spite of 4 different antihypertensive medication.

## 2020-11-27 NOTE — Telephone Encounter (Signed)
Noted. Renal artery duplex is scheduled for 12/24/20.

## 2020-12-14 ENCOUNTER — Other Ambulatory Visit: Payer: Self-pay | Admitting: Internal Medicine

## 2020-12-14 NOTE — Telephone Encounter (Signed)
Please refill as per office routine med refill policy (all routine meds refilled for 3 mo or monthly per pt preference up to one year from last visit, then month to month grace period for 3 mo, then further med refills will have to be denied)  

## 2020-12-18 ENCOUNTER — Telehealth (INDEPENDENT_AMBULATORY_CARE_PROVIDER_SITE_OTHER): Payer: Medicare Other | Admitting: Endocrinology

## 2020-12-18 ENCOUNTER — Other Ambulatory Visit: Payer: Self-pay

## 2020-12-18 DIAGNOSIS — E1151 Type 2 diabetes mellitus with diabetic peripheral angiopathy without gangrene: Secondary | ICD-10-CM

## 2020-12-18 DIAGNOSIS — Z794 Long term (current) use of insulin: Secondary | ICD-10-CM | POA: Diagnosis not present

## 2020-12-18 MED ORDER — TOUJEO MAX SOLOSTAR 300 UNIT/ML ~~LOC~~ SOPN
300.0000 [IU] | PEN_INJECTOR | SUBCUTANEOUS | 3 refills | Status: DC
Start: 2020-12-18 — End: 2021-07-18

## 2020-12-18 NOTE — Patient Instructions (Addendum)
Please continue the same Toujeo. check your blood sugar twice a day.  vary the time of day when you check, between before the 3 meals, and at bedtime.  also check if you have symptoms of your blood sugar being too high or too low.  please keep a record of the readings and bring it to your next appointment here (or you can bring the meter itself).  You can write it on any piece of paper.  please call us sooner if your blood sugar goes below 70, or if you have a lot of readings over 200.   Please come back for a follow-up appointment in 2 months.

## 2020-12-18 NOTE — Progress Notes (Signed)
   Subjective:    Patient ID: [name], male    DOB: 1956/02/09, 65 y.o.   MRN: [id number]  HPI  telehealth visit today via telephone x 11 minutes.  Alternatives to telehealth are presented to this patient, and the patient agrees to the telehealth visit. Pt is advised of the cost of the visit, and agrees to this, also.   Patient is at home, and I am at the office.   Persons attending the telehealth visit: the patient and I Pt returns for f/u of diabetes mellitus:  DM type: Insulin-requiring type 2.   Dx'ed: 8185 Complications: PAD, CRI, and PN.   Therapy: insulin since 2009.   DKA: never Severe hypoglycemia: never.   Pancreatitis: never.  SDOH: none.  Other: he takes a QD insulin regimen, after poor results with more frequent injections; he was changed to 70/30, due to pattern of cbg's, but he did poorly on this, too.   Interval history: pt says cbg's vary from 79-340.  It is in general higher as the day goes on. Pt stopped Trulicity, due to cost.      Review of Systems He denies hypoglycemia.      Objective:   Physical Exam       Assessment & Plan:  Insulin-requiring type 2 DM: improved glycemic control  Patient Instructions  Please continue the same Toujeo. check your blood sugar twice a day.  vary the time of day when you check, between before the 3 meals, and at bedtime.  also check if you have symptoms of your blood sugar being too high or too low.  please keep a record of the readings and bring it to your next appointment here (or you can bring the meter itself).  You can write it on any piece of paper.  please call us sooner if your blood sugar goes below 70, or if you have a lot of readings over 200.   Please come back for a follow-up appointment in 2 months.

## 2020-12-25 ENCOUNTER — Other Ambulatory Visit: Payer: Self-pay

## 2020-12-25 ENCOUNTER — Ambulatory Visit (INDEPENDENT_AMBULATORY_CARE_PROVIDER_SITE_OTHER): Payer: Medicare Other

## 2020-12-25 DIAGNOSIS — I10 Essential (primary) hypertension: Secondary | ICD-10-CM

## 2020-12-27 ENCOUNTER — Other Ambulatory Visit: Payer: Self-pay

## 2020-12-27 ENCOUNTER — Encounter: Payer: Self-pay | Admitting: Cardiovascular Disease

## 2020-12-27 ENCOUNTER — Ambulatory Visit: Payer: Medicare Other | Admitting: Cardiovascular Disease

## 2020-12-27 VITALS — BP 150/60 | HR 71 | Ht 68.0 in | Wt 263.5 lb

## 2020-12-27 DIAGNOSIS — I2510 Atherosclerotic heart disease of native coronary artery without angina pectoris: Secondary | ICD-10-CM

## 2020-12-27 DIAGNOSIS — I739 Peripheral vascular disease, unspecified: Secondary | ICD-10-CM | POA: Diagnosis not present

## 2020-12-27 DIAGNOSIS — E785 Hyperlipidemia, unspecified: Secondary | ICD-10-CM

## 2020-12-27 DIAGNOSIS — I471 Supraventricular tachycardia: Secondary | ICD-10-CM

## 2020-12-27 DIAGNOSIS — I10 Essential (primary) hypertension: Secondary | ICD-10-CM | POA: Diagnosis not present

## 2020-12-27 MED ORDER — CHLORTHALIDONE 25 MG PO TABS: 25.0000 mg | ORAL_TABLET | Freq: Every day | ORAL | 2 refills | Status: DC

## 2020-12-27 NOTE — Patient Instructions (Signed)
Medication Instructions:  Your physician has recommended you make the following change in your medication:   1) STOP Hydrochlorothiazide (HCTZ)  2) START Chlorthalidone 25 mg daily. An Rx has been sent to [facility].   *If you need a refill on your cardiac medications before your next appointment, please call your pharmacy*   Lab Work: Your physician recommends that you return for lab work (bmp) in: 1 week  Please have your lab drawn at the [facility]. No lab appt needed. Lab hours are Mon-Fri 7am-6pm.  If you have labs (blood work) drawn today and your tests are completely normal, you will receive your results only by: Marland Kitchen MyChart Message (if you have MyChart) OR . A paper copy in the mail If you have any lab test that is abnormal or we need to change your treatment, we will call you to review the results.   Testing/Procedures: None ordered   Follow-Up: At [facility], you and your health needs are our priority.  As part of our continuing mission to provide you with exceptional heart care, we have created designated Provider Care Teams.  These Care Teams include your primary Cardiologist (physician) and Advanced Practice Providers (APPs -  Physician Assistants and Nurse Practitioners) who all work together to provide you with the care you need, when you need it.  We recommend signing up for the patient portal called "MyChart".  Sign up information is provided on this After Visit Summary.  MyChart is used to connect with patients for Virtual Visits (Telemedicine).  Patients are able to view lab/test results, encounter notes, upcoming appointments, etc.  Non-urgent messages can be sent to your provider as well.   To learn more about what you can do with MyChart, go to [email or web address].    Your next appointment:   Your physician wants you to follow-up in: 6 months  You will receive a reminder letter in the mail two months in advance. If you don't receive a letter, please  call our office to schedule the follow-up appointment.   The format for your next appointment:   In Person  Provider:   You may see Kathlyn Sacramento, MD or one of the following Advanced Practice Providers on your designated Care Team:    Murray Hodgkins, NP  Christell Faith, PA-C  Marrianne Mood, PA-C  Cadence [place], [place]  Laurann Montana, NP    Other Instructions N/A

## 2020-12-27 NOTE — Progress Notes (Signed)
Cardiology Office Note   Date:  12/27/2020   ID:  Zorion, Nims 02/26/1956, MRN [id number]  PCP:  Biagio Borg, MD  Cardiologist:   Kathlyn Sacramento, MD   Chief Complaint  Patient presents with  . Other    3 month f/u after renal US no complaints today. Meds reviewed verbally with pt.      History of Present Illness:  [name] is a 65 y.o. male who presents for a follow up visit regarding PAD, coronary artery disease and uncontrolled hypertension. He has known history of insulin-requiring DM (dx'ed 1992),  Hyperlipidemia, hypertension and tobacco use. He is followed for right calf claudication due to occluded right SFA. Noninvasive evaluation in August 2015 showed an ABI of 0.44 on the right and normal on the left. Duplex showed long occlusion of right SFA from mid to distal segment with reconstitution in popliteal artery. There was significant left SFA stenosis.   He had atypical chest pain in late 2015. Nuclear stress test showed a small inferolateral infarct with no significant ischemia. He was treated medically given that his chest pain resolved.  He quit smoking in December 2017.   He was hospitalized in October 2020 with sepsis secondary to UTI and pyelonephritis as well as hypertensive urgency.  His troponin was mildly elevated and peaked at 741.  CTA of the chest was negative for pulmonary embolism but he was noted to have coronary artery calcifications.  Echocardiogram during admission showed an EF of 60 to 65%, moderate LVH and no significant valvular abnormalities.  During his hospitalization, he was noted to have short runs of SVT in the setting of electrolyte abnormalities. The patient subsequently underwent outpatient cardiac CTA which showed coronary calcium score 412.  There was mild left main stenosis and mild first diagonal stenosis.  He had palpitations and thus was evaluated with a 14-day monitor which showed normal sinus rhythm with an average heart rate  of 76 bpm.  He was noted to have frequent short runs of SVT as well as paroxysmal atrial fibrillation with a burden of less than 1%.  The longest episode lasted 3 minutes.  The patient was switched from amlodipine to diltiazem. The patient has known history of refractory hypertension.  He had renal artery duplex done yesterday which overall was a suboptimal study.  It showed no significant right renal artery stenosis but there was borderline stenosis affecting the left renal artery. Most recent lower extremity arterial Doppler showed an ABI of 0.54 on the right and 1.03 on the left.  He has been doing reasonably well and denies any chest pain or shortness of breath.  He reports stable right calf claudication.     Past Medical History:  Diagnosis Date  . Allergic rhinitis   . At risk for sleep apnea    STOP-BANG= 5   SENT TO PCP 03-14-2014  . CAD (coronary artery disease)    a. 07/2014 low risk MV; b. 07/2019 Cor CTA (FFR): LM 25-49 (nl), LAD mild prox/mid plaque (nl), D1 25-49p(nl w/ abnl FFR of 0.73 in inf branch), LCX/OM1 mild prox/mid plaque (nl), RCA nondominant, minimal Ca2+ plaque (nl), RPDA (mildly abnl @ 0.79)-->Med Rx..  . Cataract    surgically removed bilateral  . Condyloma acuminatum of penis   . Diabetic neuropathy ([place])   . Diastolic dysfunction    a. 05/2019 Echo: EF 60-65%, no rwam, mod LVH, impaired relaxation, nl RV size/fxn, trace MR, triv TR.  Marland Kitchen GERD (  gastroesophageal reflux disease)   . History of bladder cancer    s/p  turbt  2013/   transitional cell carcinoma--   . History of condyloma acuminatum    PERINEAL AREA  W/ RECURRENCY  . History of gout   . Hyperlipidemia   . Hypertension   . Lower urinary tract symptoms (LUTS)   . PAF (paroxysmal atrial fibrillation) ([place])    a. 06/2019 Event monitor: PAF <1% burden. Longest 3 mins 36 secs.  . Productive cough   . PSVT (paroxysmal supraventricular tachycardia) ([place])    a. 06/2019 Event monitor: 112 episodes of  SVT, longest 21 secs.  Marland Kitchen PVD (peripheral vascular disease) with claudication ([place])    a. 03/2014 LE art duplex: long segment occlusion of mid to distal R SFA; b. 03/2020 ABI: nl left and mildly improved R ABI->med rx.  . Smokers' cough ([place])   . Type 2 diabetes mellitus with insulin therapy ([place]) 1992   monitor by  dr ellsion  . Wears dentures    upper    Past Surgical History:  Procedure Laterality Date  . AXILLARY HIDRADENITIS EXCISION  1997  . CARDIOVASCULAR STRESS TEST  07-24-2014  dr Kathlyn Sacramento   Low risk lexiscan nuclear study with apical thinning and small inferolateral wall infarct at mid & basal level , no ischemia/  normal LVF and wall motion , ef 59%  . CATARACT EXTRACTION Left   . CATARACT EXTRACTION W/ INTRAOCULAR LENS IMPLANT Right   . CO2 LASER APPLICATION N/A 11/04/7122   Procedure: CO2 LASER APPLICATION,PENIS, GROIN, ANUS;  Surgeon: [name], MD;  Location: [facility];  Service: General;  Laterality: N/A;  . CO2 LASER APPLICATION N/A 58/04/9832   Procedure: CO2 LASER APPLICATION;  Surgeon: Kathie Rhodes, MD;  Location[facility];  Service: Urology;  Laterality: N/A;  . CONDYLOMA EXCISION/FULGURATION N/A 05/21/2015   Procedure: CONDYLOMA REMOVAL;  Surgeon: Kathie Rhodes, MD;  Location: [facility];  Service: Urology;  Laterality: N/A;  . HEMORRHOID SURGERY  10/24/2014   Procedure: HEMORRHOIDECTOMY;  Surgeon: Michael Boston, MD;  Location: [facility];  Service: General;;  . INCISION AND DRAINAGE ABSCESS Left 10/24/2014   Procedure: INCISION AND DRAINAGE ABSCESS;  Surgeon: Michael Boston, MD;  Location: [facility];  Service: General;  Laterality: Left;  . INGUINAL HIDRADENITIS EXCISION  1998, 1999  . LASER ABLATION CONDOLAMATA N/A 03/20/2014   Procedure: EXAM UNDER ANESTHESIA, REMOVAL/ABLATION OF CONDYLOMATA PENIS,GROINS, ANUS, ANAL CANAL;  Surgeon: [name], MD;  Location: [facility];  Service: General;  Laterality: N/A;  groin and anus  . LASER ABLATION CONDOLAMATA N/A 10/24/2014   Procedure: LASER ABLATION CONDOLAMATA;  Surgeon: Michael Boston, MD;  Location[facility];  Service: General;  Laterality: N/A;  . LASER ABLATION OF PENILE AND PERIANAL WARTS  07-29-2007  Dr. Johney Maine  . LEFT SHOULDER SURGERY  2003  . MASS EXCISION N/A 10/24/2014   Procedure: EXCISION OF PERINEAL MASS/SINUS;  Surgeon: Michael Boston, MD;  Location: [facility];  Service: General;  Laterality: N/A;  . MOHS SURGERY     back  . MOHS SURGERY  2017   face  . MULTIPLE TOOTH EXTRACTIONS    . PERINEAL HIDRADENITIS EXCISION  1998, 1999  . TRANSURETHRAL RESECTION OF BLADDER TUMOR  05/21/2012   Procedure: TRANSURETHRAL RESECTION OF BLADDER TUMOR (TURBT);  Surgeon: Claybon Jabs, MD;  Location: [facility];  Service: Urology;  Laterality: N/A;        Current Outpatient Medications  Medication Sig Dispense Refill  . aspirin EC 81 MG tablet Take 1 tablet (81 mg total) by mouth daily.    Marland Kitchen atorvastatin (LIPITOR) 40 MG tablet Take 1 tablet by mouth once daily 90 tablet 1  . Blood Glucose Monitoring Suppl (ONE TOUCH ULTRA 2) w/Device KIT Use as directed to check blood sugars twice a day 1 kit 0  . chlorthalidone (HYGROTON) 25 MG tablet Take 1 tablet (25 mg total) by mouth daily. 90 tablet 2  . diltiazem (CARDIZEM CD) 360 MG 24 hr capsule Take 1 capsule (360 mg total) by mouth daily. 90 capsule 3  . glucose blood test strip 1 each by Other route daily at 8 pm. Use as instructed    . insulin glargine, 2 Unit Dial, (TOUJEO MAX SOLOSTAR) 300 UNIT/ML Solostar Pen Inject 300 Units into the skin every morning. 102 mL 3  . Lancets (ONETOUCH ULTRASOFT) lancets 1 each by Other route 2 (two) times daily. Dx E11.9 and Z79.4 180 each 3  . loratadine (CLARITIN) 10 MG tablet Take 10 mg by mouth daily as needed.     Marland Kitchen losartan (COZAAR) 100 MG tablet losartan 100 mg  tablet    . pantoprazole (PROTONIX) 40 MG tablet TAKE 1 TABLET BY MOUTH  DAILY 90 tablet 3  . spironolactone (ALDACTONE) 25 MG tablet Take 1 tablet (25 mg total) by mouth daily. 90 tablet 3   No current facility-administered medications for this visit.    Allergies:   Cefepime, Metoprolol, and Myrbetriq [mirabegron]    Social History:  The patient  reports that he quit smoking about 4 years ago. His smoking use included cigars and cigarettes. He has a 41.00 pack-year smoking history. He has never used smokeless tobacco. He reports that he does not drink alcohol and does not use drugs.   Family History:  The patient's family history includes Cancer in his father; Diabetes in his maternal aunt; Hypertension in his mother.    ROS:  Please see the history of present illness.   Otherwise, review of systems are positive for none.   All other systems are reviewed and negative.    PHYSICAL EXAM: VS:  BP (!) 150/60 (BP Location: Left Arm, Patient Position: Sitting, Cuff Size: Normal)   Pulse 71   Ht 5' 8"  (1.727 m)   Wt 263 lb 8 oz (119.5 kg)   SpO2 98%   BMI 40.07 kg/m  , BMI Body mass index is 40.07 kg/m. GEN: Well nourished, well developed, in no acute distress  HEENT: normal  Neck: no JVD, carotid bruits, or masses Cardiac: RRR; no murmurs, rubs, or gallops, trace edema  Respiratory:  clear to auscultation bilaterally, normal work of breathing GI: soft, nontender, nondistended, + BS MS: no deformity or atrophy  Skin: warm and dry, no rash Neuro:  Strength and sensation are intact Psych: euthymic mood, full affect   EKG:  EKG is ordered today. EKG showed normal sinus rhythm with no significant ST or T wave changes.   Recent Labs: 10/25/2020: ALT 51; BUN 18; Creatinine, Ser 1.20; Hemoglobin 15.3; Platelets 295.0; Potassium 4.2; Sodium 138; TSH 3.15    Lipid Panel    Component Value Date/Time   CHOL 120 10/25/2020 1417   CHOL 151 06/16/2019 1144   TRIG 206.0 (H) 10/25/2020  1417   HDL 33.60 (L) 10/25/2020 1417   HDL 28 (L) 06/16/2019 1144   CHOLHDL 4 10/25/2020 1417  VLDL 41.2 (H) 10/25/2020 1417   LDLCALC 35 10/20/2019 1442   LDLCALC 88 06/16/2019 1144   LDLDIRECT 62.0 10/25/2020 1417      Wt Readings from Last 3 Encounters:  12/27/20 263 lb 8 oz (119.5 kg)  12/18/20 260 lb (117.9 kg)  11/09/20 263 lb 6.4 oz (119.5 kg)       ASSESSMENT AND PLAN:  1.  Peripheral arterial disease: Known occluded right SFA .stable right calf claudication.  Continue medical therapy.  2. Coronary artery disease involving native coronary arteries without angina: Cardiac CTA showed elevated calcium score as outlined above with mild left main and diagonal disease.  Recommend aggressive medical therapy.  3.  Paroxysmal supraventricular tachycardia: A. fib burden was not significant enough to justify anticoagulation.  Symptoms are controlled with diltiazem.  4. Essential hypertension: Blood pressure is still not controlled.  I elected to switch hydrochlorothiazide to chlorthalidone 25 mg once daily check basic metabolic profile in 1 week.  I am not convinced that he has significant renal artery stenosis but further imaging with CTA or MRA can be considered in the future if blood pressure remains uncontrolled.  5.  Hyperlipidemia: Continue treatment with atorvastatin.  Most recent LDL was 35.   Disposition:   FU with me in 6 months.  Signed,  Kathlyn Sacramento, MD  12/27/2020 5:51 PM    [facility]

## 2021-01-10 ENCOUNTER — Other Ambulatory Visit: Payer: Self-pay | Admitting: Internal Medicine

## 2021-01-16 ENCOUNTER — Other Ambulatory Visit
Admission: RE | Admit: 2021-01-16 | Discharge: 2021-01-16 | Disposition: A | Discharge status: Home / Self Care | Payer: Medicare Other | Attending: Cardiovascular Disease | Admitting: Cardiovascular Disease

## 2021-01-16 DIAGNOSIS — I10 Essential (primary) hypertension: Secondary | ICD-10-CM | POA: Insufficient documentation

## 2021-01-16 LAB — BASIC METABOLIC PANEL
Anion gap: 9 (ref 5–15)
BUN: 25 mg/dL — ABNORMAL HIGH (ref 8–23)
CO2: 23 mmol/L (ref 22–32)
Calcium: 9.3 mg/dL (ref 8.9–10.3)
Chloride: 100 mmol/L (ref 98–111)
Creatinine, Ser: 1.25 mg/dL — ABNORMAL HIGH (ref 0.61–1.24)
GFR, Estimated: >60 mL/min (ref >60)
Glucose, Bld: 267 mg/dL — ABNORMAL HIGH (ref 70–99)
Potassium: 4.2 mmol/L (ref 3.5–5.1)
Sodium: 132 mmol/L — ABNORMAL LOW (ref 135–145)
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
## 2021-02-12 ENCOUNTER — Other Ambulatory Visit: Payer: Self-pay

## 2021-02-12 ENCOUNTER — Ambulatory Visit (INDEPENDENT_AMBULATORY_CARE_PROVIDER_SITE_OTHER): Payer: Medicare Other | Admitting: Internal Medicine

## 2021-02-12 ENCOUNTER — Encounter: Payer: Self-pay | Admitting: Internal Medicine

## 2021-02-12 VITALS — BP 142/78 | HR 67 | Temp 98.7°F | Ht 68.0 in | Wt 259.0 lb

## 2021-02-12 DIAGNOSIS — M542 Cervicalgia: Secondary | ICD-10-CM

## 2021-02-12 DIAGNOSIS — R519 Headache, unspecified: Secondary | ICD-10-CM | POA: Diagnosis not present

## 2021-02-12 DIAGNOSIS — M5412 Radiculopathy, cervical region: Secondary | ICD-10-CM | POA: Diagnosis not present

## 2021-02-12 MED ORDER — METHOCARBAMOL 500 MG PO TABS: 500.0000 mg | ORAL_TABLET | Freq: Four times a day (QID) | ORAL | 0 refills | Status: DC | PRN

## 2021-02-12 NOTE — Patient Instructions (Addendum)
     Medications changes include :   methocarbamol - muscle relaxer.  You can take Tylenol if needed for your headaches.   Your prescription(s) have been submitted to your pharmacy. Please take as directed and contact our office if you believe you are having problem(s) with the medication(s).   A referral was ordered for  sports medicine.   Someone from their office will call you to schedule an appointment.     A referral was ordered for Physical therapy.
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
## 2021-02-18 ENCOUNTER — Ambulatory Visit: Payer: Medicare Other | Admitting: Family Medicine

## 2021-02-19 ENCOUNTER — Ambulatory Visit: Payer: Medicare Other | Attending: Internal Medicine | Admitting: Physical Therapy

## 2021-02-19 ENCOUNTER — Other Ambulatory Visit: Payer: Self-pay | Admitting: Cardiovascular Disease

## 2021-02-19 ENCOUNTER — Other Ambulatory Visit: Payer: Self-pay

## 2021-02-19 ENCOUNTER — Encounter: Payer: Self-pay | Admitting: Physical Therapy

## 2021-02-19 DIAGNOSIS — M545 Low back pain, unspecified: Secondary | ICD-10-CM | POA: Diagnosis not present

## 2021-02-19 DIAGNOSIS — R293 Abnormal posture: Secondary | ICD-10-CM | POA: Insufficient documentation

## 2021-02-19 DIAGNOSIS — M542 Cervicalgia: Secondary | ICD-10-CM | POA: Diagnosis not present

## 2021-02-19 DIAGNOSIS — M6281 Muscle weakness (generalized): Secondary | ICD-10-CM | POA: Insufficient documentation

## 2021-02-19 NOTE — Patient Instructions (Signed)
Access Code: [email or web address] URL: [email or web address] Date: 02/19/2021 Prepared by: Hilda Blades  Exercises Supine Lower Trunk Rotation - 2 x daily - 7 x weekly - 10 reps - 10 seconds hold Seated Hamstring Stretch - 2 x daily - 7 x weekly - 3 reps - 20 seconds hold Hooklying Clamshell with Resistance - 2 x daily - 7 x weekly - 2 sets - 10 reps Supine March with Resistance Band - 2 x daily - 7 x weekly - 2 sets - 10 reps Seated Upper Trapezius Stretch - 2 x daily - 7 x weekly - 3 reps - 20 seconds hold Standing Row with Anchored Resistance - 2 x daily - 7 x weekly - 2 sets - 10 reps
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
## 2021-02-21 ENCOUNTER — Other Ambulatory Visit: Payer: Self-pay

## 2021-02-21 ENCOUNTER — Ambulatory Visit (INDEPENDENT_AMBULATORY_CARE_PROVIDER_SITE_OTHER): Payer: Medicare Other

## 2021-02-21 ENCOUNTER — Ambulatory Visit: Payer: Medicare Other | Admitting: Family Medicine

## 2021-02-21 VITALS — BP 138/82 | HR 67 | Ht 68.0 in | Wt 259.4 lb

## 2021-02-21 DIAGNOSIS — R0781 Pleurodynia: Secondary | ICD-10-CM | POA: Diagnosis not present

## 2021-02-21 DIAGNOSIS — M542 Cervicalgia: Secondary | ICD-10-CM

## 2021-02-21 DIAGNOSIS — M546 Pain in thoracic spine: Secondary | ICD-10-CM

## 2021-02-21 DIAGNOSIS — S2231XA Fracture of one rib, right side, initial encounter for closed fracture: Secondary | ICD-10-CM | POA: Diagnosis not present

## 2021-02-21 NOTE — Patient Instructions (Addendum)
Thank you for coming in today.   Please get an Xray today before you leave   Continue PT.   Recheck in 6 weeks or sooner if you have a problem.   Keep me updated.
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
## 2021-02-25 NOTE — Progress Notes (Signed)
Rib x-ray shows a fracture to the right sixth front lateral rib

## 2021-02-25 NOTE — Progress Notes (Signed)
X-ray cervical spine shows multiple levels of arthritis changes.

## 2021-02-25 NOTE — Progress Notes (Signed)
Thoracic spine x-ray shows some arthritis and a tiny bit of scoliosis.

## 2021-02-27 ENCOUNTER — Other Ambulatory Visit: Payer: Self-pay

## 2021-02-27 ENCOUNTER — Encounter: Payer: Self-pay | Admitting: Physical Therapy

## 2021-02-27 ENCOUNTER — Ambulatory Visit: Payer: Medicare Other | Admitting: Physical Therapy

## 2021-02-27 DIAGNOSIS — M545 Low back pain, unspecified: Secondary | ICD-10-CM

## 2021-02-27 DIAGNOSIS — M6281 Muscle weakness (generalized): Secondary | ICD-10-CM | POA: Diagnosis not present

## 2021-02-27 DIAGNOSIS — R293 Abnormal posture: Secondary | ICD-10-CM | POA: Diagnosis not present

## 2021-02-27 DIAGNOSIS — M542 Cervicalgia: Secondary | ICD-10-CM

## 2021-02-27 NOTE — Therapy (Signed)
[place], [place], [address] Phone: [phone]   Fax:  [phone]  Physical Therapy Treatment  Patient Details  Name: [name] MRN: [id number] Date of Birth: [birth year] Referring Provider (PT): Binnie Rail, MD   Encounter Date: 02/27/2021   PT End of Session - 02/27/21 1353     Visit Number 2    Number of Visits 6    Date for PT Re-Evaluation 04/02/21    Authorization Type UHC MCR    Authorization Time Period FOTO by 6th and 10th, KX by 15th    Progress Note Due on Visit 10    PT Start Time 1355    PT Stop Time 1435    PT Time Calculation (min) 40 min    Activity Tolerance Patient tolerated treatment well    Behavior During Therapy [facility] for tasks assessed/performed             Past Medical History:  Diagnosis Date   Allergic rhinitis    At risk for sleep apnea    STOP-BANG= 5   SENT TO PCP 03-14-2014   CAD (coronary artery disease)    a. 07/2014 low risk MV; b. 07/2019 Cor CTA (FFR): LM 25-49 (nl), LAD mild prox/mid plaque (nl), D1 25-49p(nl w/ abnl FFR of 0.73 in inf branch), LCX/OM1 mild prox/mid plaque (nl), RCA nondominant, minimal Ca2+ plaque (nl), RPDA (mildly abnl @ 0.79)-->Med Rx..   Cataract    surgically removed bilateral   Condyloma acuminatum of penis    Diabetic neuropathy ([place])    Diastolic dysfunction    a. 05/2019 Echo: EF 60-65%, no rwam, mod LVH, impaired relaxation, nl RV size/fxn, trace MR, triv TR.   GERD (gastroesophageal reflux disease)    History of bladder cancer    s/p  turbt  2013/   transitional cell carcinoma--    History of condyloma acuminatum    PERINEAL AREA  W/ RECURRENCY   History of gout    Hyperlipidemia    Hypertension    Lower urinary tract symptoms (LUTS)    PAF (paroxysmal atrial fibrillation) ([place])    a. 06/2019 Event monitor: PAF <1% burden. Longest 3 mins 36 secs.   Productive cough    PSVT (paroxysmal supraventricular tachycardia)  ([place])    a. 06/2019 Event monitor: 112 episodes of SVT, longest 21 secs.   PVD (peripheral vascular disease) with claudication ([place])    a. 03/2014 LE art duplex: long segment occlusion of mid to distal R SFA; b. 03/2020 ABI: nl left and mildly improved R ABI->med rx.   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  07-24-2014  dr Kathlyn [place]   Low risk [place] nuclear study with apical thinning and small inferolateral wall infarct at mid & basal level , no ischemia/  normal LVF and wall motion , ef 59%   CATARACT EXTRACTION Left    CATARACT EXTRACTION W/ INTRAOCULAR LENS IMPLANT Right    CO2 LASER APPLICATION N/A 02/27/9469   Procedure: CO2 LASER APPLICATION,PENIS, Virl Son, ANUS;  Surgeon: [name], MD;  Location: [place];  Service: General;  Laterality: N/A;   CO2 LASER APPLICATION N/A 96/28/3662   Procedure: CO2 LASER APPLICATION;  Surgeon:  Kathie Rhodes, MD;  Location: [facility];  Service: Urology;  Laterality: N/A;   CONDYLOMA EXCISION/FULGURATION N/A 05/21/2015   Procedure: CONDYLOMA REMOVAL;  Surgeon: Kathie Rhodes, MD;  Location: [facility];  Service: Urology;  Laterality: N/A;   HEMORRHOID SURGERY  10/24/2014   Procedure: HEMORRHOIDECTOMY;  Surgeon: Michael Boston, MD;  Location: [facility];  Service: General;;   INCISION AND DRAINAGE ABSCESS Left 10/24/2014   Procedure: INCISION AND DRAINAGE ABSCESS;  Surgeon: Michael Boston, MD;  Location: [place];  Service: General;  Laterality: Left;   INGUINAL HIDRADENITIS [place] N/A 03/20/2014   Procedure: EXAM UNDER ANESTHESIA, REMOVAL/ABLATION OF CONDYLOMATA PENIS,GROINS, ANUS, ANAL CANAL;  Surgeon: [name], MD;  Location: [place];  Service: General;  Laterality: N/A;  groin and anus   LASER ABLATION CONDOLAMATA N/A 10/24/2014   Procedure: LASER ABLATION CONDOLAMATA;  Surgeon: Michael Boston, MD;  Location: [place];  Service: General;  Laterality: N/A;   LASER ABLATION OF PENILE AND PERIANAL WARTS  07-29-2007  Dr. 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   Subjective Assessment - 02/27/21 1358     Subjective Patient reports he was told by the doctor that he had a cracked rib and curvature in his mid spine, and he hasn't heard from the doctor what they wanted to do about it. He is supposed to see his doctor aagin in about 6 weeks. Patient states that his neck started bothering him on the way to therapy, he was driving and felt a pinch. States his low back isn't hurting him any more than normal.    Patient Stated Goals Get back and neck feeling better    Currently in Pain? Yes    Pain Location Back    Pain Orientation Right;Left;Lower    Pain Descriptors / Indicators Aching;Tightness;Sharp    Pain Type Acute pain    Pain Onset 1 to 4 weeks ago    Pain Frequency Intermittent    Pain Score 5    Pain Location Neck    Pain Orientation Right    Pain Descriptors / Indicators Tightness    Pain Type Acute pain    Pain Onset 1 to 4 weeks ago    Pain Frequency Intermittent                 [facility] PT Assessment - 02/27/21 0001       Observation/Other Assessments   Focus on Therapeutic Outcomes (FOTO)  44% functional status                           [facility] Adult PT Treatment/Exercise - 02/27/21 0001       Self-Care   Self-Care Other Self-Care  Comments    Other Self-Care Comments  FOTO      Exercises   Exercises Neck;Lumbar      Neck Exercises: Theraband   Rows 15 reps   2 sets   Rows Limitations yellow band    Horizontal ABduction 15 reps   2 sets   Horizontal ABduction Limitations yellow band      Lumbar Exercises: Stretches   Passive Hamstring Stretch 2 reps;30 seconds    Passive Hamstring Stretch Limitations supine PROM    Lower Trunk Rotation 5 reps;10 seconds    Other Lumbar Stretch Exercise Seated lumbar flexion physioball rollout 5 x 10 sec      Lumbar Exercises: Aerobic   Nustep L5 x 5 min with UE/LE while taking subjective      Lumbar Exercises: Supine   Clam 15 reps   2 sets   Clam Limitations yellow band    Bent Knee Raise 15 reps   2 sets   Bent Knee Raise Limitations yellow band    Bridge 5 reps;5 seconds   2 sets   Bridge Limitations more representative of a pelvic tilt      Manual Therapy   Manual Therapy Myofascial release;Passive ROM    Myofascial Release Supine suboccipital release with gentle cervical traction    Passive ROM Right UT stretch      Neck Exercises: Stretches   Upper Trapezius Stretch 2 reps;20 seconds                    PT Education - 02/27/21 1353     Education Details HEP, FOTO    Person(s) Educated Patient    Methods Explanation;Demonstration;Verbal cues    Comprehension Verbalized understanding;Returned demonstration;Verbal cues required;Need further instruction              PT Short Term Goals - 02/27/21 1442       PT SHORT TERM GOAL #1   Title Patient will be I with initial HEP to progress with PT    Time 3    Period Weeks    Status New    Target Date 03/12/21       PT SHORT TERM GOAL #2   Title PT will reassess FOTO and review with patient on 2nd visit    Time 2    Period Weeks    Status Achieved    Target Date 03/05/21               PT Long Term Goals - 02/27/21 1453       PT LONG TERM GOAL #1   Title Patient will be I with final HEP to maintain progress from PT    Time 6    Period Weeks    Status New    Target Date 04/02/21      PT LONG TERM GOAL #2   Title Patient will report >/= 61% functional status on FOTO    Time 6    Period Weeks    Status New    Target Date 04/02/21      PT LONG TERM GOAL #3   Title Patient will demonstrate lumbar AROM grossly WFL and without increase in pain in order to reduce muscular tension and improve lifting ability    Time 6    Period Weeks    Status New    Target Date 04/02/21      PT LONG TERM GOAL #4   Title Patient will report </= 2/10 right sided neck pain  with all activity to improve household tasks    Time 6    Period Weeks    Status New    Target Date 04/02/21      PT LONG TERM GOAL #5   Title Patient will demonstrate >/= 60 deg cervical rotation bilaterally to improve driving    Time 6    Period Weeks    Status New    Target Date 04/02/21                   Plan - 02/27/21 1353     Clinical Impression Statement Patient tolerated therapy well with no adverse effects. Therapy focused on progressing spinal mobility and continued core/postural strengthening to reduce muscular tension and pain. Patient did report feeling looser following therapy and able to move neck better. No changes to HEP this visit, assessed FOTO and reviewed with patient.  Patient would benefit from continued skilled PT to progress mobility and strength to reduce pain and maximize his functional ability.    PT Treatment/Interventions ADLs/Self Care Home Management;Aquatic Therapy;Cryotherapy;Electrical Stimulation;Iontophoresis 4mg /ml Dexamethasone;Moist Heat;Traction;Ultrasound;Neuromuscular  re-education;Balance training;Therapeutic exercise;Therapeutic activities;Functional mobility training;Stair training;Gait training;Patient/family education;Manual techniques;Dry needling;Passive range of motion;Taping;Vasopneumatic Device;Spinal Manipulations;Joint Manipulations    PT Next Visit Plan Review HEP and progress PRN, manual/stretching for right upper trap region and right lumbar paraspinals, progress postural control and core/hip stabilization exercises    PT Home Exercise Plan [id number]    Consulted and Agree with Plan of Care Patient             Patient will benefit from skilled therapeutic intervention in order to improve the following deficits and impairments:  Decreased range of motion, Difficulty walking, Postural dysfunction, Decreased strength, Impaired sensation, Impaired flexibility, Pain, Decreased activity tolerance, Increased muscle spasms  Visit Diagnosis: Acute midline low back pain, unspecified whether sciatica present  Cervicalgia  Muscle weakness (generalized)  Abnormal posture     Problem List Patient Active Problem List   Diagnosis Date Noted   Aortic atherosclerosis ([place]) 10/25/2020   Vitamin D deficiency 10/20/2019   Noncompliance with medication regimen - changes himself often 06/28/2019   Elevated troponin    Hypokalemia    Hypomagnesemia    Sepsis ([place]) 06/05/2019   Olecranon bursitis of right elbow 04/21/2019   Laceration of skin of right lower leg 06/11/2016   Diabetes ([place]) 10/02/2015   Back pain 04/02/2015   Dyslipidemia 01/01/2015   Atypical chest pain 07/18/2014   Encounter for well adult exam with abnormal findings 07/03/2014   PAD (peripheral artery disease) ([place]) 62/69/4854   Folliculitis of scrotum 04/03/2014   Pain in joint, lower leg 04/03/2014   Edema of posterior shaft of penis 01/31/2014   Bladder cancer ([place]) 05/21/2012   Gross hematuria 03/25/2012   Encounter for long-term (current) use of other medications  02/02/2012   Screening for prostate cancer 02/02/2012   Hepatomegaly 09/10/2010   ABNORMAL ELECTROCARDIOGRAM 09/10/2010   FOOT PAIN, LEFT 05/02/2010   Asthma 07/25/2009   Tobacco abuse 05/22/2009   Cough 05/11/2008   Condylomata of penis, groins, perianal - recurrent 01/17/2008   SHOULDER PAIN, LEFT, CHRONIC 01/17/2008   HYPERCHOLESTEROLEMIA 10/06/2007   Essential hypertension 03/06/2007   ALLERGIC RHINITIS 03/06/2007    Hilda Blades, PT, DPT, LAT, ATC 02/27/21  2:55 PM Phone: [phone] Fax: [facility] [address] [place], [place], [address] Phone: [phone]   Fax:  [phone]  Name: ISOM KOCHAN MRN: [id number] Date of Birth: [birth year]

## 2021-03-05 ENCOUNTER — Other Ambulatory Visit: Payer: Self-pay | Admitting: Internal Medicine

## 2021-03-13 ENCOUNTER — Other Ambulatory Visit: Payer: Self-pay

## 2021-03-13 ENCOUNTER — Ambulatory Visit: Payer: Medicare Other | Attending: Family Medicine | Admitting: Physical Therapy

## 2021-03-13 ENCOUNTER — Encounter: Payer: Self-pay | Admitting: Physical Therapy

## 2021-03-13 DIAGNOSIS — R293 Abnormal posture: Secondary | ICD-10-CM | POA: Insufficient documentation

## 2021-03-13 DIAGNOSIS — M542 Cervicalgia: Secondary | ICD-10-CM | POA: Insufficient documentation

## 2021-03-13 DIAGNOSIS — M6281 Muscle weakness (generalized): Secondary | ICD-10-CM | POA: Diagnosis not present

## 2021-03-13 DIAGNOSIS — M545 Low back pain, unspecified: Secondary | ICD-10-CM | POA: Insufficient documentation

## 2021-03-13 NOTE — Therapy (Addendum)
[place], [place], [address] Phone: [phone]   Fax:  [phone]  Physical Therapy Treatment / Discharge  Patient Details  Name: [name] MRN: [id number] Date of Birth: [birth year] Referring Provider (PT): Binnie Rail, MD   Encounter Date: 03/13/2021   PT End of Session - 03/13/21 1518     Visit Number 3    Number of Visits 6    Date for PT Re-Evaluation 04/02/21    Authorization Type UHC MCR    Authorization Time Period FOTO by 6th and 10th, KX by 15th    Progress Note Due on Visit 10    PT Start Time 1530    PT Stop Time 1610    PT Time Calculation (min) 40 min    Activity Tolerance Patient tolerated treatment well    Behavior During Therapy [facility] for tasks assessed/performed             Past Medical History:  Diagnosis Date   Allergic rhinitis    At risk for sleep apnea    STOP-BANG= 5   SENT TO PCP 03-14-2014   CAD (coronary artery disease)    a. 07/2014 low risk MV; b. 07/2019 Cor CTA (FFR): LM 25-49 (nl), LAD mild prox/mid plaque (nl), D1 25-49p(nl w/ abnl FFR of 0.73 in inf branch), LCX/OM1 mild prox/mid plaque (nl), RCA nondominant, minimal Ca2+ plaque (nl), RPDA (mildly abnl @ 0.79)-->Med Rx..   Cataract    surgically removed bilateral   Condyloma acuminatum of penis    Diabetic neuropathy ([place])    Diastolic dysfunction    a. 05/2019 Echo: EF 60-65%, no rwam, mod LVH, impaired relaxation, nl RV size/fxn, trace MR, triv TR.   GERD (gastroesophageal reflux disease)    History of bladder cancer    s/p  turbt  2013/   transitional cell carcinoma--    History of condyloma acuminatum    PERINEAL AREA  W/ RECURRENCY   History of gout    Hyperlipidemia    Hypertension    Lower urinary tract symptoms (LUTS)    PAF (paroxysmal atrial fibrillation) ([place])    a. 06/2019 Event monitor: PAF <1% burden. Longest 3 mins 36 secs.   Productive cough    PSVT (paroxysmal supraventricular  tachycardia) ([place])    a. 06/2019 Event monitor: 112 episodes of SVT, longest 21 secs.   PVD (peripheral vascular disease) with claudication ([place])    a. 03/2014 LE art duplex: long segment occlusion of mid to distal R SFA; b. 03/2020 ABI: nl left and mildly improved R ABI->med rx.   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  07-24-2014  dr Kathlyn [place]   Low risk [place] nuclear study with apical thinning and small inferolateral wall infarct at mid & basal level , no ischemia/  normal LVF and wall motion , ef 59%   CATARACT EXTRACTION Left    CATARACT EXTRACTION W/ INTRAOCULAR LENS IMPLANT Right    CO2 LASER APPLICATION N/A 2/56/3893   Procedure: CO2 LASER APPLICATION,PENIS, Virl Son, ANUS;  Surgeon: [name], MD;  Location: [place];  Service: General;  Laterality: N/A;   CO2 LASER APPLICATION N/A 73/42/8768   Procedure: CO2 LASER APPLICATION;  Surgeon: Kathie Rhodes, MD;  [facility];  Service: Urology;  Laterality: N/A;   CONDYLOMA EXCISION/FULGURATION N/A 05/21/2015   Procedure: CONDYLOMA REMOVAL;  Surgeon: Kathie Rhodes, MD;  Location: [facility];  Service: Urology;  Laterality: N/A;   HEMORRHOID SURGERY  10/24/2014   Procedure: HEMORRHOIDECTOMY;  Surgeon: Michael Boston, MD;  Location: [facility];  Service: General;;   INCISION AND DRAINAGE ABSCESS Left 10/24/2014   Procedure: INCISION AND DRAINAGE ABSCESS;  Surgeon: Michael Boston, MD;  Location: [place];  Service: General;  Laterality: Left;   INGUINAL HIDRADENITIS [place] N/A 03/20/2014   Procedure: EXAM UNDER ANESTHESIA, REMOVAL/ABLATION OF CONDYLOMATA PENIS,GROINS, ANUS, ANAL CANAL;   Surgeon: [name], MD;  Location: [place];  Service: General;  Laterality: N/A;  groin and anus   LASER ABLATION CONDOLAMATA N/A 10/24/2014   Procedure: LASER ABLATION CONDOLAMATA;  Surgeon: Michael Boston, MD;  Location: [place];  Service: General;  Laterality: N/A;   LASER ABLATION OF PENILE AND PERIANAL WARTS  07-29-2007  Dr. 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   Subjective Assessment - 03/13/21 1536     Subjective Patient reports his neck got pretty tight a few days ago he did a few of the exercises and it is feeling a little bit better now. He states there were a few days where he had no pain in his neck. His lower back hurts mainly in the morning but them will loosen up throughout the day.    Patient Stated Goals Get back and neck feeling better    Currently in Pain? Yes    Pain Score 4     Pain Location Back    Pain Orientation Lower;Right;Left    Pain Descriptors / Indicators Aching;Tightness    Pain Type Chronic pain    Pain Onset More than a month ago    Pain Frequency Intermittent    Pain Score 5    Pain Location Neck    Pain Orientation Right    Pain Descriptors / Indicators Tightness    Pain Type Chronic pain    Pain Onset More than a month ago    Pain Frequency Intermittent                [facility] PT Assessment - 03/13/21 0001       AROM    Cervical - Right Side Bend 30    Cervical - Left Side Bend 30    Cervical - Right Rotation 60    Cervical - Left Rotation 55                           [facility] Adult PT Treatment/Exercise - 03/13/21 0001  Exercises   Exercises Neck;Lumbar      Neck Exercises: Theraband   Rows 15 reps   2 sets   Rows Limitations red band    Shoulder External Rotation 15 reps   2 sets   Shoulder External Rotation Limitations 2 sets    Horizontal ABduction 15 reps   2 sets   Horizontal ABduction Limitations red band      Neck Exercises: Stretches   Upper Trapezius Stretch 2 reps;20 seconds    Levator Stretch 2 reps;20 seconds      Lumbar Exercises: Stretches   Passive Hamstring Stretch 2 reps;30 seconds    Passive Hamstring Stretch Limitations supine PROM    Lower Trunk Rotation 5 reps;10 seconds    Piriformis Stretch 2 reps;30 seconds    Piriformis Stretch Limitations supine PROM    Other Lumbar Stretch Exercise Seated lumbar flexion physioball rollout 5 x 10 sec      Lumbar Exercises: Aerobic   Nustep L5 x 5 min with UE/LE while taking subjective                    PT Education - 03/13/21 1517     Education Details HEP    Person(s) Educated Patient    Methods Explanation;Demonstration;Verbal cues    Comprehension Verbalized understanding;Returned demonstration;Verbal cues required;Need further instruction              PT Short Term Goals - 02/27/21 1442       PT SHORT TERM GOAL #1   Title Patient will be I with initial HEP to progress with PT    Time 3    Period Weeks    Status New    Target Date 03/12/21      PT SHORT TERM GOAL #2   Title PT will reassess FOTO and review with patient on 2nd visit    Time 2    Period Weeks    Status Achieved    Target Date 03/05/21               PT Long Term Goals - 02/27/21 1453       PT LONG TERM GOAL #1   Title Patient will be I with final HEP to maintain progress from PT    Time 6     Period Weeks    Status New    Target Date 04/02/21      PT LONG TERM GOAL #2   Title Patient will report >/= 61% functional status on FOTO    Time 6    Period Weeks    Status New    Target Date 04/02/21      PT LONG TERM GOAL #3   Title Patient will demonstrate lumbar AROM grossly WFL and without increase in pain in order to reduce muscular tension and improve lifting ability    Time 6    Period Weeks    Status New    Target Date 04/02/21      PT LONG TERM GOAL #4   Title Patient will report </= 2/10 right sided neck pain with all activity to improve household tasks    Time 6    Period Weeks    Status New    Target Date 04/02/21      PT LONG TERM GOAL #5   Title Patient will demonstrate >/= 60 deg cervical rotation bilaterally to improve driving    Time 6    Period Weeks    Status New  Target Date 04/02/21                   Plan - 03/13/21 1523     Clinical Impression Statement Patient tolerated therapy well with no adverse effects. Overall patient seems to be improving but continues to report peristent low back and neck tightness. He does exhibit improvement in his cervical range of motion today. Continued to work on lumbar and cervical mobility and progressing core and postural strength with good tolerance. His neck tightness seems to be upper trap related so possibly trial dry needling next visit. Patient would benefit from continued skilled PT to progress mobility and strength to reduce pain and maximize his functional ability.    PT Treatment/Interventions ADLs/Self Care Home Management;Aquatic Therapy;Cryotherapy;Electrical Stimulation;Iontophoresis 4m/ml Dexamethasone;Moist Heat;Traction;Ultrasound;Neuromuscular re-education;Balance training;Therapeutic exercise;Therapeutic activities;Functional mobility training;Stair training;Gait training;Patient/family education;Manual techniques;Dry needling;Passive range of motion;Taping;Vasopneumatic Device;Spinal  Manipulations;Joint Manipulations    PT Next Visit Plan Review HEP and progress PRN, manual/stretching for right upper trap region and right lumbar paraspinals, progress postural control and core/hip stabilization exercises    PT Home Exercise Plan [id number]    Consulted and Agree with Plan of Care Patient             Patient will benefit from skilled therapeutic intervention in order to improve the following deficits and impairments:  Decreased range of motion, Difficulty walking, Postural dysfunction, Decreased strength, Impaired sensation, Impaired flexibility, Pain, Decreased activity tolerance, Increased muscle spasms  Visit Diagnosis: Acute midline low back pain, unspecified whether sciatica present  Cervicalgia  Muscle weakness (generalized)  Abnormal posture     Problem List Patient Active Problem List   Diagnosis Date Noted   Aortic atherosclerosis ([place] 10/25/2020   Vitamin D deficiency 10/20/2019   Noncompliance with medication regimen - changes himself often 06/28/2019   Elevated troponin    Hypokalemia    Hypomagnesemia    Sepsis ([place] 06/05/2019   Olecranon bursitis of right elbow 04/21/2019   Laceration of skin of right lower leg 06/11/2016   Diabetes ([place] 10/02/2015   Back pain 04/02/2015   Dyslipidemia 01/01/2015   Atypical chest pain 07/18/2014   Encounter for well adult exam with abnormal findings 07/03/2014   PAD (peripheral artery disease) ([place] 004/88/8916  Folliculitis of scrotum 04/03/2014   Pain in joint, lower leg 04/03/2014   Edema of posterior shaft of penis 01/31/2014   Bladder cancer ([place] 05/21/2012   Gross hematuria 03/25/2012   Encounter for long-term (current) use of other medications 02/02/2012   Screening for prostate cancer 02/02/2012   Hepatomegaly 09/10/2010   ABNORMAL ELECTROCARDIOGRAM 09/10/2010   FOOT PAIN, LEFT 05/02/2010   Asthma 07/25/2009   Tobacco abuse 05/22/2009   Cough 05/11/2008   Condylomata of penis, groins,  perianal - recurrent 01/17/2008   SHOULDER PAIN, LEFT, CHRONIC 01/17/2008   HYPERCHOLESTEROLEMIA 10/06/2007   Essential hypertension 03/06/2007   ALLERGIC RHINITIS 03/06/2007    CHilda Blades PT, DPT, LAT, ATC 03/13/21  4:30 PM Phone: [phone]: 3[facility] [place] [address]Phone: [phone]  Fax:  [phone] Name: Paul BEAUMIERMRN: [id number] of Birth: 201-12-1955  PHYSICAL THERAPY DISCHARGE SUMMARY  Visits from Start of Care: 3  Current functional level related to goals / functional outcomes: See above   Remaining deficits: See above   Education / Equipment: HEP   Patient agrees to discharge. Patient goals were partially met. Patient is being discharged due to  not returning since the last visit.

## 2021-03-27 ENCOUNTER — Encounter: Payer: Medicare Other | Admitting: Physical Therapy

## 2021-04-04 ENCOUNTER — Other Ambulatory Visit: Payer: Self-pay

## 2021-04-04 ENCOUNTER — Encounter: Payer: Self-pay | Admitting: Family Medicine

## 2021-04-04 ENCOUNTER — Ambulatory Visit: Payer: Medicare Other | Admitting: Family Medicine

## 2021-04-04 VITALS — BP 138/60 | HR 71 | Ht 68.0 in | Wt 258.4 lb

## 2021-04-04 DIAGNOSIS — S2231XD Fracture of one rib, right side, subsequent encounter for fracture with routine healing: Secondary | ICD-10-CM | POA: Diagnosis not present

## 2021-04-04 DIAGNOSIS — M542 Cervicalgia: Secondary | ICD-10-CM | POA: Diagnosis not present
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
## 2021-04-04 NOTE — Patient Instructions (Signed)
Thank you for coming in today.   Plan for continued home exercise and some watching waiting on the neck and the rib.   If your pain does not continue to improve I could get you back to physical therapy to do some dry needling which may help.   We can also more testing if needed.

## 2021-04-30 ENCOUNTER — Ambulatory Visit: Payer: Medicare Other | Admitting: Internal Medicine

## 2021-05-01 ENCOUNTER — Encounter: Payer: Self-pay | Admitting: Internal Medicine

## 2021-05-01 ENCOUNTER — Other Ambulatory Visit: Payer: Self-pay

## 2021-05-01 ENCOUNTER — Ambulatory Visit (INDEPENDENT_AMBULATORY_CARE_PROVIDER_SITE_OTHER): Payer: Medicare Other | Admitting: Internal Medicine

## 2021-05-01 VITALS — BP 168/64 | HR 81 | Temp 98.7°F | Ht 68.0 in | Wt 256.0 lb

## 2021-05-01 DIAGNOSIS — E559 Vitamin D deficiency, unspecified: Secondary | ICD-10-CM | POA: Diagnosis not present

## 2021-05-01 DIAGNOSIS — E7800 Pure hypercholesterolemia, unspecified: Secondary | ICD-10-CM | POA: Diagnosis not present

## 2021-05-01 DIAGNOSIS — E1151 Type 2 diabetes mellitus with diabetic peripheral angiopathy without gangrene: Secondary | ICD-10-CM | POA: Diagnosis not present

## 2021-05-01 DIAGNOSIS — F419 Anxiety disorder, unspecified: Secondary | ICD-10-CM | POA: Diagnosis not present

## 2021-05-01 DIAGNOSIS — Z794 Long term (current) use of insulin: Secondary | ICD-10-CM | POA: Diagnosis not present

## 2021-05-01 DIAGNOSIS — Z23 Encounter for immunization: Secondary | ICD-10-CM

## 2021-05-01 DIAGNOSIS — I10 Essential (primary) hypertension: Secondary | ICD-10-CM | POA: Diagnosis not present

## 2021-05-01 LAB — BASIC METABOLIC PANEL WITH GFR
BUN: 23 mg/dL (ref 6–23)
CO2: 27 meq/L (ref 19–32)
Calcium: 9.5 mg/dL (ref 8.4–10.5)
Chloride: 98 meq/L (ref 96–112)
Creatinine, Ser: 1.5 mg/dL (ref 0.40–1.50)
GFR: 48.88 mL/min — ABNORMAL LOW
Glucose, Bld: 253 mg/dL — ABNORMAL HIGH (ref 70–99)
Potassium: 4 meq/L (ref 3.5–5.1)
Sodium: 134 meq/L — ABNORMAL LOW (ref 135–145)

## 2021-05-01 LAB — HEPATIC FUNCTION PANEL
ALT: 34 U/L (ref 0–53)
AST: 29 U/L (ref 0–37)
Albumin: 4 g/dL (ref 3.5–5.2)
Alkaline Phosphatase: 83 U/L (ref 39–117)
Bilirubin, Direct: 0.2 mg/dL (ref 0.0–0.3)
Total Bilirubin: 0.7 mg/dL (ref 0.2–1.2)
Total Protein: 7.6 g/dL (ref 6.0–8.3)

## 2021-05-01 LAB — VITAMIN D 25 HYDROXY (VIT D DEFICIENCY, FRACTURES): VITD: 39.05 ng/mL (ref 30.00–100.00)

## 2021-05-01 LAB — LDL CHOLESTEROL, DIRECT: Direct LDL: 54 mg/dL

## 2021-05-01 LAB — LIPID PANEL
Cholesterol: 102 mg/dL (ref 0–200)
HDL: 27.8 mg/dL — ABNORMAL LOW
NonHDL: 74.02
Total CHOL/HDL Ratio: 4
Triglycerides: 264 mg/dL — ABNORMAL HIGH (ref 0.0–149.0)
VLDL: 52.8 mg/dL — ABNORMAL HIGH (ref 0.0–40.0)

## 2021-05-01 LAB — HEMOGLOBIN A1C: Hgb A1c MFr Bld: 9.4 % — ABNORMAL HIGH (ref 4.6–6.5)

## 2021-05-01 MED ORDER — CITALOPRAM HYDROBROMIDE 10 MG PO TABS: 10.0000 mg | ORAL_TABLET | Freq: Every day | ORAL | 3 refills | Status: DC

## 2021-05-01 NOTE — Assessment & Plan Note (Signed)
Last vitamin D Lab Results  Component Value Date   VD25OH 35.06 10/25/2020   Low, to start oral replacement - 2000 u qd Vit D3

## 2021-05-01 NOTE — Progress Notes (Signed)
Patient ID: [name], male   DOB: 1956/04/26, 65 y.o.   MRN: [id number]        Chief Complaint: follow up HTN, HLD and hyperglycemia, low vit d and anxiety       HPI:  [name] is a 65 y.o. male here overall doing ok,  Pt denies chest pain, increased sob or doe, wheezing, orthopnea, PND, [name], palpitations, [name], polyuria, or new focal neuro s/s.   Pt denies fever, wt loss, night sweats, loss of appetite, or other constitutional symptoms   Not yet started Vit D.  Denies worsening depressive symptoms, suicidal ideation, or panic; has ongoing anxiety, much increased recently with multiple social stressors. BP has been < 140/90 at home.   Wt Readings from Last 3 Encounters:  05/01/21 256 lb (116.1 kg)  04/04/21 258 lb 6.4 oz (117.2 kg)  02/21/21 259 lb 6.4 oz (117.7 kg)   BP Readings from Last 3 Encounters:  05/01/21 (!) 168/64  04/04/21 138/60  02/21/21 138/82         Past Medical History:  Diagnosis Date   Allergic rhinitis    At risk for sleep apnea    STOP-BANG= 5   SENT TO PCP 03-14-2014   CAD (coronary artery disease)    a. 07/2014 low risk MV; b. 07/2019 Cor CTA (FFR): LM 25-49 (nl), LAD mild prox/mid plaque (nl), D1 25-49p(nl w/ abnl FFR of 0.73 in inf branch), LCX/OM1 mild prox/mid plaque (nl), RCA nondominant, minimal Ca2+ plaque (nl), RPDA (mildly abnl @ 0.79)-->Med Rx..   Cataract    surgically removed bilateral   Condyloma acuminatum of penis    Diabetic neuropathy ([place])    Diastolic dysfunction    a. 05/2019 Echo: EF 60-65%, no rwam, mod LVH, impaired relaxation, nl RV size/fxn, trace MR, triv TR.   GERD (gastroesophageal reflux disease)    History of bladder cancer    s/p  turbt  2013/   transitional cell carcinoma--    History of condyloma acuminatum    PERINEAL AREA  W/ RECURRENCY   History of gout    Hyperlipidemia    Hypertension    Lower urinary tract symptoms (LUTS)    PAF (paroxysmal atrial  fibrillation) ([place])    a. 06/2019 Event monitor: PAF <1% burden. Longest 3 mins 36 secs.   Productive cough    PSVT (paroxysmal supraventricular tachycardia) ([place])    a. 06/2019 Event monitor: 112 episodes of SVT, longest 21 secs.   PVD (peripheral vascular disease) with claudication ([place])    a. 03/2014 LE art duplex: long segment occlusion of mid to distal R SFA; b. 03/2020 ABI: nl left and mildly improved R ABI->med rx.   Smokers' cough ([place])    Type 2 diabetes mellitus with insulin therapy ([place]) 1992   monitor by  dr ellsion   Wears dentures    upper   Past Surgical History:  Procedure Laterality Date   AXILLARY HIDRADENITIS EXCISION  1997   CARDIOVASCULAR STRESS TEST  07-24-2014  dr Kathlyn [place]   Low risk [place] nuclear study with apical thinning and small inferolateral wall infarct at mid & basal level , no ischemia/  normal LVF and wall motion , ef 59%   CATARACT EXTRACTION Left    CATARACT EXTRACTION W/ INTRAOCULAR LENS IMPLANT Right    CO2 LASER APPLICATION N/A 2/35/3614   Procedure: CO2 LASER APPLICATION,PENIS, [place], ANUS;  Surgeon: [name], MD;  Location:  [place];  Service: General;  Laterality: N/A;   CO2 LASER APPLICATION N/A 61/95/0932   Procedure: CO2 LASER APPLICATION;  Surgeon: Kathie Rhodes, MD;  Location: [place];  Service: Urology;  Laterality: N/A;   CONDYLOMA EXCISION/FULGURATION N/A 05/21/2015   Procedure: CONDYLOMA REMOVAL;  Surgeon: Kathie Rhodes, MD;  Location: [facility];  Service: Urology;  Laterality: N/A;   HEMORRHOID SURGERY  10/24/2014   Procedure: HEMORRHOIDECTOMY;  Surgeon: Michael Boston, MD;  Location: [facility];  Service: General;;   INCISION AND DRAINAGE ABSCESS Left 10/24/2014   Procedure: INCISION AND DRAINAGE ABSCESS;  Surgeon: Michael Boston, MD;  Location: [place];  Service: General;  Laterality: Left;   INGUINAL HIDRADENITIS [place] N/A 03/20/2014   Procedure: EXAM UNDER ANESTHESIA, REMOVAL/ABLATION OF CONDYLOMATA PENIS,GROINS, ANUS, ANAL CANAL;  Surgeon: [name], MD;  Location: [place];  Service: General;  Laterality: N/A;  groin and anus   LASER ABLATION CONDOLAMATA N/A 10/24/2014   Procedure: LASER ABLATION CONDOLAMATA;  Surgeon: Michael Boston, MD;  Location: [place];  Service: General;  Laterality: N/A;   LASER ABLATION OF PENILE AND PERIANAL WARTS  07-29-2007  Dr. 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;  Service: Urology;  Laterality: N/A;       reports that he quit smoking about 5 years ago. His smoking use included cigars and cigarettes. He has a 41.00 pack-year smoking history. He has never used smokeless tobacco. He reports that he does not drink alcohol and does not use drugs. family history includes Cancer in his father; Diabetes in his maternal aunt; Hypertension in his mother. Allergies  Allergen Reactions   Cefepime Hives    Had allergic reaction to one of these 3 agents, unclear which one   Metoprolol Hives    Had allergic reaction to one of these 3 agents, unclear which one  *Per RN, highly likely this is the cause of allergic rxn   Myrbetriq [Mirabegron] Hives    Had allergic reaction to one of these 3 agents, unclear which one   Current Outpatient Medications on File Prior to Visit  Medication Sig Dispense Refill   aspirin EC 81 MG tablet Take  1 tablet (81 mg total) by mouth daily.     atorvastatin (LIPITOR) 40 MG tablet Take 1 tablet by mouth once daily 90 tablet 1   Blood Glucose Monitoring Suppl (ONE TOUCH ULTRA 2) w/Device KIT Use as directed to check blood sugars twice a day 1 kit 0   diltiazem (CARDIZEM CD) 360 MG 24 hr capsule Take 1 capsule (360 mg total) by mouth daily. 90 capsule 3   insulin glargine, 2 Unit Dial, (TOUJEO MAX SOLOSTAR) 300 UNIT/ML Solostar Pen Inject 300 Units into the skin every morning. 102 mL 3   irbesartan (AVAPRO) 300 MG tablet Take 300 mg by mouth daily.  Lancets (ONETOUCH ULTRASOFT) lancets 1 each by Other route 2 (two) times daily. Dx E11.9 and Z79.4 180 each 3   loratadine (CLARITIN) 10 MG tablet Take 10 mg by mouth daily as needed.      methocarbamol (ROBAXIN) 500 MG tablet Take 1 tablet (500 mg total) by mouth every 6 (six) hours as needed for muscle spasms. 60 tablet 0   ONETOUCH ULTRA test strip CHECK BLOOD SUGAR TWICE  DAILY 200 strip 3   pantoprazole (PROTONIX) 40 MG tablet TAKE 1 TABLET BY MOUTH  DAILY 90 tablet 3   spironolactone (ALDACTONE) 25 MG tablet Take 1 tablet by mouth once daily 90 tablet 1   chlorthalidone (HYGROTON) 25 MG tablet Take 1 tablet (25 mg total) by mouth daily. 90 tablet 2   No current facility-administered medications on file prior to visit.        ROS:  All others reviewed and negative.  Objective        PE:  BP (!) 168/64 (BP Location: Right Arm, Patient Position: Sitting, Cuff Size: Large)   Pulse 81   Temp 98.7 F (37.1 C) (Oral)   Ht _0  (1.727 m)   Wt 256 lb (116.1 kg)   SpO2 96%   BMI 38.92 kg/m                 Constitutional: Pt appears in NAD               HENT: Head: NCAT.                Right Ear: External ear normal.                 Left Ear: External ear normal.                Eyes: . Pupils are equal, round, and reactive to light. Conjunctivae and EOM are normal               Nose: without d/c or deformity               Neck: Neck  supple. Gross normal ROM               Cardiovascular: Normal rate and regular rhythm.                 Pulmonary/Chest: Effort normal and breath sounds without rales or wheezing.                Abd:  Soft, NT, ND, + BS, no organomegaly               Neurological: Pt is alert. At baseline orientation, motor grossly intact               Skin: Skin is warm. No rashes, no other new lesions, LE edema - none               Psychiatric: Pt behavior is normal without agitation   Micro: none  Cardiac tracings I have personally interpreted today:  none  Pertinent Radiological findings (summarize): none   Lab Results  Component Value Date   WBC 12.9 (H) 10/25/2020   HGB 15.3 10/25/2020   HCT 43.8 10/25/2020   PLT 295.0 10/25/2020   GLUCOSE 253 (H) 05/01/2021   CHOL 102 05/01/2021   TRIG 264.0 (H) 05/01/2021   HDL 27.80 (L) 05/01/2021   LDLDIRECT 54.0 05/01/2021   LDLCALC 35 10/20/2019   ALT 34 05/01/2021   AST 29  05/01/2021   NA 134 (L) 05/01/2021   K 4.0 05/01/2021   CL 98 05/01/2021   CREATININE 1.50 05/01/2021   BUN 23 05/01/2021   CO2 27 05/01/2021   TSH 3.15 10/25/2020   PSA 1.76 10/25/2020   INR 1.1 06/04/2019   HGBA1C 9.4 (H) 05/01/2021   MICROALBUR 18.0 (H) 10/25/2020   Assessment/Plan:  RANSOM NICKSON is a 65 y.o. White or Caucasian [1] male with  has a past medical history of Allergic rhinitis, At risk for sleep apnea, CAD (coronary artery disease), Cataract, Condyloma acuminatum of penis, Diabetic neuropathy ([place]), Diastolic dysfunction, GERD (gastroesophageal reflux disease), History of bladder cancer, History of condyloma acuminatum, History of gout, Hyperlipidemia, Hypertension, Lower urinary tract symptoms (LUTS), PAF (paroxysmal atrial fibrillation) ([place]), Productive cough, PSVT (paroxysmal supraventricular tachycardia) ([place]), PVD (peripheral vascular disease) with claudication ([place]), Smokers' cough ([place]), Type 2 diabetes mellitus with insulin therapy ([place]) (1992), and  Wears dentures.  Vitamin D deficiency Last vitamin D Lab Results  Component Value Date   VD25OH 35.06 10/25/2020   Low, to start oral replacement - 2000 u qd Vit D3  HYPERCHOLESTEROLEMIA Lab Results  Component Value Date   LDLCALC 35 10/20/2019   Stable, pt to continue current statin liptior   Essential hypertension BP Readings from Last 3 Encounters:  05/01/21 (!) 168/64  04/04/21 138/60  02/21/21 138/82   Uncontrolled here, likely situational, pt to continue medical treatment cardizem, avapro, aldactone, chlorthalidone as pt declines change   Diabetes ([place]) Lab Results  Component Value Date   HGBA1C 9.4 (H) 05/01/2021   Uncontrolled, pt to continue current medical treatment as pt prefers f/u with endo   Anxiety Uncontrolled worsening, for celexa 10 qd  Followup: Return in about 6 months (around 10/29/2021).  Cathlean Cower, MD 05/04/2021 7:50 PM [facility]

## 2021-05-01 NOTE — Patient Instructions (Addendum)
Please take all new medication as prescribed - the celexa 10 mg per day  Please continue all other medications as before, and refills have been done if requested.  Please have the pharmacy call with any other refills you may need.  Please continue your efforts at being more active, low cholesterol diet, and weight control.  Please keep your appointments with your specialists as you may have planned  Please go to the LAB at the blood drawing area for the tests to be done  You will be contacted by phone if any changes need to be made immediately.  Otherwise, you will receive a letter about your results with an explanation, but please check with MyChart first.  Please remember to sign up for MyChart if you have not done so, as this will be important to you in the future with finding out test results, communicating by private email, and scheduling acute appointments online when needed.  Please make an Appointment to return in 6 months, or sooner if needed

## 2021-05-04 ENCOUNTER — Encounter: Payer: Self-pay | Admitting: Internal Medicine

## 2021-05-04 NOTE — Assessment & Plan Note (Signed)
BP Readings from Last 3 Encounters:  05/01/21 (!) 168/64  04/04/21 138/60  02/21/21 138/82   Uncontrolled here, likely situational, pt to continue medical treatment cardizem, avapro, aldactone, chlorthalidone as pt declines change

## 2021-05-04 NOTE — Assessment & Plan Note (Signed)
Lab Results  Component Value Date   HGBA1C 9.4 (H) 05/01/2021   Uncontrolled, pt to continue current medical treatment as pt prefers f/u with endo

## 2021-05-04 NOTE — Assessment & Plan Note (Signed)
Lab Results  Component Value Date   LDLCALC 35 10/20/2019   Stable, pt to continue current statin liptior

## 2021-05-04 NOTE — Assessment & Plan Note (Signed)
Uncontrolled worsening, for celexa 10 qd

## 2021-05-13 DIAGNOSIS — Z8551 Personal history of malignant neoplasm of bladder: Secondary | ICD-10-CM | POA: Diagnosis not present

## 2021-05-13 DIAGNOSIS — R3912 Poor urinary stream: Secondary | ICD-10-CM | POA: Diagnosis not present

## 2021-06-17 ENCOUNTER — Other Ambulatory Visit: Payer: Self-pay | Admitting: Internal Medicine

## 2021-06-17 NOTE — Telephone Encounter (Signed)
Please refill as per office routine med refill policy (all routine meds to be refilled for 3 mo or monthly (per pt preference) up to one year from last visit, then month to month grace period for 3 mo, then further med refills will have to be denied) ? ?

## 2021-06-24 ENCOUNTER — Encounter: Payer: Self-pay | Admitting: Internal Medicine

## 2021-07-10 ENCOUNTER — Other Ambulatory Visit: Payer: Self-pay | Admitting: Internal Medicine

## 2021-07-10 NOTE — Telephone Encounter (Signed)
Please refill as per office routine med refill policy (all routine meds to be refilled for 3 mo or monthly (per pt preference) up to one year from last visit, then month to month grace period for 3 mo, then further med refills will have to be denied) ? ?

## 2021-07-17 ENCOUNTER — Telehealth: Payer: Self-pay | Admitting: Endocrinology

## 2021-07-17 NOTE — Telephone Encounter (Signed)
insulin glargine, 2 Unit Dial, (TOUJEO MAX SOLOSTAR) 300 UNIT/ML Solostar Pen  Pt needs 90 day supple of the above to be sent to:  [facility] ([place], [place] Phone:  [phone]  Fax:  [phone]

## 2021-07-18 ENCOUNTER — Ambulatory Visit: Payer: Medicare Other | Admitting: Endocrinology

## 2021-07-18 ENCOUNTER — Other Ambulatory Visit: Payer: Self-pay

## 2021-07-18 VITALS — BP 122/56 | HR 61 | Ht 68.0 in | Wt 252.4 lb

## 2021-07-18 DIAGNOSIS — E1151 Type 2 diabetes mellitus with diabetic peripheral angiopathy without gangrene: Secondary | ICD-10-CM | POA: Diagnosis not present

## 2021-07-18 DIAGNOSIS — Z794 Long term (current) use of insulin: Secondary | ICD-10-CM

## 2021-07-18 LAB — POCT GLYCOSYLATED HEMOGLOBIN (HGB A1C): Hemoglobin A1C: 9.1 % — AB (ref 4.0–5.6)

## 2021-07-18 MED ORDER — VICTOZA 18 MG/3ML ~~LOC~~ SOPN: 0.6000 mg | PEN_INJECTOR | Freq: Every day | SUBCUTANEOUS | 3 refills | Status: DC

## 2021-07-18 MED ORDER — TOUJEO MAX SOLOSTAR 300 UNIT/ML ~~LOC~~ SOPN: 270.0000 [IU] | PEN_INJECTOR | SUBCUTANEOUS | 3 refills | Status: DC

## 2021-07-18 NOTE — Progress Notes (Signed)
Subjective:    Patient ID: [name], male    DOB: Mar 14, 1956, 65 y.o.   MRN: [id number]  HPI Pt returns for f/u of diabetes mellitus:  DM type: Insulin-requiring type 2.   Dx'ed: 4431 Complications: PAD, CRI, and PN.    Therapy: insulin since 2009.   DKA: never Severe hypoglycemia: never.   Pancreatitis: never.  SDOH: he stopped Trulicity, due to cost Other: he takes a QD insulin regimen, after poor results with more frequent injections; he was changed to 70/30, due to pattern of cbg's, but he did poorly on this, too.   Interval history: pt says cbg's vary from 79-130.  It is in general higher as the day goes on.  pt states he feels well in general. He takes 260 units qam.    Past Medical History:  Diagnosis Date   Allergic rhinitis    At risk for sleep apnea    STOP-BANG= 5   SENT TO PCP 03-14-2014   CAD (coronary artery disease)    a. 07/2014 low risk MV; b. 07/2019 Cor CTA (FFR): LM 25-49 (nl), LAD mild prox/mid plaque (nl), D1 25-49p(nl w/ abnl FFR of 0.73 in inf branch), LCX/OM1 mild prox/mid plaque (nl), RCA nondominant, minimal Ca2+ plaque (nl), RPDA (mildly abnl @ 0.79)-->Med Rx..   Cataract    surgically removed bilateral   Condyloma acuminatum of penis    Diabetic neuropathy ([place])    Diastolic dysfunction    a. 05/2019 Echo: EF 60-65%, no rwam, mod LVH, impaired relaxation, nl RV size/fxn, trace MR, triv TR.   GERD (gastroesophageal reflux disease)    History of bladder cancer    s/p  turbt  2013/   transitional cell carcinoma--    History of condyloma acuminatum    PERINEAL AREA  W/ RECURRENCY   History of gout    Hyperlipidemia    Hypertension    Lower urinary tract symptoms (LUTS)    PAF (paroxysmal atrial fibrillation) ([place])    a. 06/2019 Event monitor: PAF <1% burden. Longest 3 mins 36 secs.   Productive cough    PSVT (paroxysmal supraventricular tachycardia) ([place])    a. 06/2019 Event monitor: 112 episodes of SVT, longest 21 secs.   PVD (peripheral  vascular disease) with claudication ([place])    a. 03/2014 LE art duplex: long segment occlusion of mid to distal R SFA; b. 03/2020 ABI: nl left and mildly improved R ABI->med rx.   Smokers' cough ([place])    Type 2 diabetes mellitus with insulin therapy ([place]) 1992   monitor by  dr ellsion   Wears dentures    upper    Past Surgical History:  Procedure Laterality Date   AXILLARY HIDRADENITIS [place] TEST  07-24-2014  dr Kathlyn [place]   Low risk [place] nuclear study with apical thinning and small inferolateral wall infarct at mid & basal level , no ischemia/  normal LVF and wall motion , ef 59%   CATARACT EXTRACTION Left    CATARACT EXTRACTION W/ INTRAOCULAR LENS IMPLANT Right    CO2 LASER APPLICATION N/A 5/40/0867   Procedure: CO2 LASER APPLICATION,PENIS, GROIN, ANUS;  Surgeon: [name], MD;  Location: [place];  Service: General;  Laterality: N/A;   CO2 LASER APPLICATION N/A 61/95/0932   Procedure: CO2 LASER APPLICATION;  Surgeon: Kathie Rhodes, MD;  Location: [place];  Service: Urology;  Laterality: N/A;   CONDYLOMA EXCISION/FULGURATION N/A 05/21/2015   Procedure: CONDYLOMA  REMOVAL;  Surgeon: Kathie Rhodes, MD;  Location: [facility];  Service: Urology;  Laterality: N/A;   HEMORRHOID SURGERY  10/24/2014   Procedure: HEMORRHOIDECTOMY;  Surgeon: Michael Boston, MD;  Location: [place];  Service: General;;   INCISION AND DRAINAGE ABSCESS Left 10/24/2014   Procedure: INCISION AND DRAINAGE ABSCESS;  Surgeon: Michael Boston, MD;  Location: [place];  Service: General;  Laterality: Left;   INGUINAL HIDRADENITIS [place] N/A 03/20/2014   Procedure: EXAM UNDER ANESTHESIA, REMOVAL/ABLATION OF CONDYLOMATA PENIS,GROINS, ANUS, ANAL CANAL;  Surgeon: [name], MD;  Location: [place];  Service: General;  Laterality: N/A;  groin  and anus   LASER ABLATION CONDOLAMATA N/A 10/24/2014   Procedure: LASER ABLATION CONDOLAMATA;  Surgeon: Michael Boston, MD;  Location: [place];  Service: General;  Laterality: N/A;   LASER ABLATION OF PENILE AND PERIANAL WARTS  07-29-2007  Dr. 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  05/21/2012   Procedure: TRANSURETHRAL RESECTION OF BLADDER TUMOR (TURBT);  Surgeon: Claybon Jabs, MD;  Location: [facility];  Service: Urology;  Laterality: N/A;       Social History   Socioeconomic History   Marital status: Married    Spouse name: Not on file   Number of children: Not on file   Years of education: Not on file   Highest education level: Not on file  Occupational History   Occupation: Disabled  Tobacco Use   Smoking status: Former    Packs/day: 1.00    Years: 41.00    Pack years: 41.00    Types: Cigars, Cigarettes    Quit date: 04/11/2016    Years since quitting: 5.2   Smokeless tobacco: Never  Vaping Use   Vaping Use: Former  Substance and Sexual Activity   Alcohol use: No    Alcohol/week: 0.0 standard drinks   Drug use: No   Sexual activity: Not on file  Other Topics Concern   Not on file  Social History Narrative   Not on file   Social Determinants of Health   Financial Resource Strain: Not on file  Food Insecurity: Not on file  Transportation Needs: Not on file  Physical Activity: Not on file  Stress: Not on file  Social Connections: Not on file  Intimate Partner Violence: Not on file    Current Outpatient Medications on File Prior to Visit  Medication Sig Dispense Refill   aspirin EC  81 MG tablet Take 1 tablet (81 mg total) by mouth daily.     atorvastatin (LIPITOR) 40 MG tablet Take 1 tablet by mouth once daily 90 tablet 2   Blood Glucose Monitoring Suppl (ONE TOUCH ULTRA 2) w/Device KIT Use as directed to check blood sugars twice a day 1 kit 0   citalopram (CELEXA) 10 MG tablet Take 1 tablet (10 mg total) by mouth daily. 90 tablet 3   diltiazem (CARDIZEM CD) 360 MG 24 hr capsule Take 1 capsule (360 mg total) by mouth daily. Johnson  capsule 3   irbesartan (AVAPRO) 300 MG tablet TAKE 1 TABLET BY MOUTH  DAILY 90 tablet 3   Lancets (ONETOUCH ULTRASOFT) lancets 1 each by Other route 2 (two) times daily. Dx E11.9 and Z79.4 180 each 3   loratadine (CLARITIN) 10 MG tablet Take 10 mg by mouth daily as needed.      methocarbamol (ROBAXIN) 500 MG tablet Take 1 tablet (500 mg total) by mouth every 6 (six) hours as needed for muscle spasms. 60 tablet 0   ONETOUCH ULTRA test strip CHECK BLOOD SUGAR TWICE  DAILY 200 strip 3   pantoprazole (PROTONIX) 40 MG tablet TAKE 1 TABLET BY MOUTH  DAILY 90 tablet 3   spironolactone (ALDACTONE) 25 MG tablet Take 1 tablet by mouth once daily 90 tablet 1   chlorthalidone (HYGROTON) 25 MG tablet Take 1 tablet (25 mg total) by mouth daily. 90 tablet 2   No current facility-administered medications on file prior to visit.    Allergies  Allergen Reactions   Cefepime Hives    Had allergic reaction to one of these 3 agents, unclear which one   Metoprolol Hives    Had allergic reaction to one of these 3 agents, unclear which one  *Per RN, highly likely this is the cause of allergic rxn   Myrbetriq [Mirabegron] Hives    Had allergic reaction to one of these 3 agents, unclear which one    Family History  Problem Relation Age of Onset   Hypertension Mother    Cancer Father        lung ca   Diabetes Maternal Aunt        x 2   Colon cancer Neg Hx    Esophageal cancer Neg Hx    Pancreatic cancer Neg Hx    Prostate cancer Neg Hx    Kidney disease Neg  Hx    Liver disease Neg Hx    Lung cancer Neg Hx    Rectal cancer Neg Hx    Stomach cancer Neg Hx     BP (!) 122/56   Pulse 61   Ht 5' 8"  (1.727 m)   Wt 252 lb 6.4 oz (114.5 kg)   SpO2 97%   BMI 38.38 kg/m    Review of Systems He denies hypoglycemia.      Objective:   Physical Exam    Lab Results  Component Value Date   HGBA1C 9.1 (A) 07/18/2021      Assessment & Plan:  Insulin-requiring type 2 DM: uncontrolled  Patient Instructions  I have sent 2 prescription to your pharmacy: to add Victoza, and to increase the Toujeo.  check your blood sugar twice a day.  vary the time of day when you check, between before the 3 meals, and at bedtime.  also check if you have symptoms of your blood sugar being too high or too low.  please keep a record of the readings and bring it to your next appointment here (or you can bring the meter itself).  You can write it on any piece of paper.  please call us sooner if your blood sugar goes below 70, or if you have a lot of readings over 200.   Please come back for a follow-up appointment in 2 months.

## 2021-07-18 NOTE — Patient Instructions (Addendum)
I have sent 2 prescription to your pharmacy: to add Victoza, and to increase the Toujeo.  check your blood sugar twice a day.  vary the time of day when you check, between before the 3 meals, and at bedtime.  also check if you have symptoms of your blood sugar being too high or too low.  please keep a record of the readings and bring it to your next appointment here (or you can bring the meter itself).  You can write it on any piece of paper.  please call us sooner if your blood sugar goes below 70, or if you have a lot of readings over 200.   Please come back for a follow-up appointment in 2 months.

## 2021-07-25 ENCOUNTER — Telehealth: Payer: Self-pay | Admitting: Endocrinology

## 2021-07-25 NOTE — Telephone Encounter (Signed)
Looks as if RX was sent on 12/08

## 2021-07-25 NOTE — Telephone Encounter (Signed)
Appt Set for Feb 2023. Has refill been processed?

## 2021-07-25 NOTE — Telephone Encounter (Signed)
Pt Insurance Provider called inquiring on the below medication. The PT unable to afford it and if there was an assistance program the PT can use. I mentioned PAP. Is there any other program available the PT can use?  insulin glargine, 2 Unit Dial, (TOUJEO MAX SOLOSTAR) 300 UNIT/ML Solostar Pen

## 2021-07-29 ENCOUNTER — Other Ambulatory Visit (HOSPITAL_COMMUNITY): Payer: Self-pay

## 2021-07-29 NOTE — Telephone Encounter (Signed)
Attempted to contact the patient regarding alternatives, mail box full and unable to leave a voicemail.

## 2021-07-30 NOTE — Telephone Encounter (Signed)
Spoke with the patient regarding alternatives of Triseba and Levemir for a lower copay. Patient says that he paid the out of pocket expense $386 for Toujeo with a 90 day supply picked up on 12/16. He would like to discuss the alternatives at his next appt on 09/19/21 since his 90 day supply will be complete in March.

## 2021-08-01 ENCOUNTER — Telehealth: Payer: Self-pay

## 2021-08-01 NOTE — Telephone Encounter (Signed)
Patient's wife called stating that patient has got scammed from someone calling his phone out of $2,000, started back smoking, and sits in the garage for hours. Per patient's wife, concerned about patient having dementia. Scheduled patient an appt for 08/08/2021

## 2021-08-05 ENCOUNTER — Other Ambulatory Visit: Payer: Self-pay | Admitting: Internal Medicine

## 2021-08-05 NOTE — Telephone Encounter (Signed)
Please refill as per office routine med refill policy (all routine meds to be refilled for 3 mo or monthly (per pt preference) up to one year from last visit, then month to month grace period for 3 mo, then further med refills will have to be denied) ? ?

## 2021-08-08 ENCOUNTER — Encounter: Payer: Self-pay | Admitting: Internal Medicine

## 2021-08-08 ENCOUNTER — Other Ambulatory Visit: Payer: Self-pay

## 2021-08-08 ENCOUNTER — Ambulatory Visit (INDEPENDENT_AMBULATORY_CARE_PROVIDER_SITE_OTHER): Payer: Medicare Other | Admitting: Internal Medicine

## 2021-08-08 VITALS — BP 110/52 | HR 57 | Temp 97.7°F | Ht 68.0 in | Wt 249.0 lb

## 2021-08-08 DIAGNOSIS — E1151 Type 2 diabetes mellitus with diabetic peripheral angiopathy without gangrene: Secondary | ICD-10-CM | POA: Diagnosis not present

## 2021-08-08 DIAGNOSIS — Z794 Long term (current) use of insulin: Secondary | ICD-10-CM

## 2021-08-08 DIAGNOSIS — I10 Essential (primary) hypertension: Secondary | ICD-10-CM

## 2021-08-08 DIAGNOSIS — F419 Anxiety disorder, unspecified: Secondary | ICD-10-CM | POA: Diagnosis not present

## 2021-08-08 MED ORDER — CITALOPRAM HYDROBROMIDE 20 MG PO TABS: 20.0000 mg | ORAL_TABLET | Freq: Every day | ORAL | 3 refills | Status: DC

## 2021-08-08 NOTE — Progress Notes (Signed)
Patient ID: [name], male   DOB: 12-23-1955, 65 y.o.   MRN: [id number]       Chief Complaint: follow up with wife after pt scammed for $2000, dm, htn       HPI:  [name] is a 65 y.o. male here after pt answering phone calls from others he does not know, and tends to get nervous and fearful and has been sending money  for "causes", now lost up to $2000, about $50 at a time.  Denies worsening depressive symptoms, suicidal ideation, or panic; has ongoing anxiety.  Pt denies chest pain, increased sob or doe, wheezing, orthopnea, PND, [name], palpitations, [name], polyuria, or new focal neuro s/s.   Wt Readings from Last 3 Encounters:  08/08/21 249 lb (112.9 kg)  07/18/21 252 lb 6.4 oz (114.5 kg)  05/01/21 256 lb (116.1 kg)   BP Readings from Last 3 Encounters:  08/08/21 (!) 110/52  07/18/21 (!) 122/56  05/01/21 (!) 168/64         Past Medical History:  Diagnosis Date   Allergic rhinitis    At risk for sleep apnea    STOP-BANG= 5   SENT TO PCP 03-14-2014   CAD (coronary artery disease)    a. 07/2014 low risk MV; b. 07/2019 Cor CTA (FFR): LM 25-49 (nl), LAD mild prox/mid plaque (nl), D1 25-49p(nl w/ abnl FFR of 0.73 in inf branch), LCX/OM1 mild prox/mid plaque (nl), RCA nondominant, minimal Ca2+ plaque (nl), RPDA (mildly abnl @ 0.79)-->Med Rx..   Cataract    surgically removed bilateral   Condyloma acuminatum of penis    Diabetic neuropathy ([place])    Diastolic dysfunction    a. 05/2019 Echo: EF 60-65%, no rwam, mod LVH, impaired relaxation, nl RV size/fxn, trace MR, triv TR.   GERD (gastroesophageal reflux disease)    History of bladder cancer    s/p  turbt  2013/   transitional cell carcinoma--    History of condyloma acuminatum    PERINEAL AREA  W/ RECURRENCY   History of gout    Hyperlipidemia    Hypertension    Lower urinary tract symptoms (LUTS)    PAF (paroxysmal atrial fibrillation) ([place])    a. 06/2019 Event  monitor: PAF <1% burden. Longest 3 mins 36 secs.   Productive cough    PSVT (paroxysmal supraventricular tachycardia) ([place])    a. 06/2019 Event monitor: 112 episodes of SVT, longest 21 secs.   PVD (peripheral vascular disease) with claudication ([place])    a. 03/2014 LE art duplex: long segment occlusion of mid to distal R SFA; b. 03/2020 ABI: nl left and mildly improved R ABI->med rx.   Smokers' cough ([place])    Type 2 diabetes mellitus with insulin therapy ([place]) 1992   monitor by  dr ellsion   Wears dentures    upper   Past Surgical History:  Procedure Laterality Date   AXILLARY HIDRADENITIS EXCISION  1997   CARDIOVASCULAR STRESS TEST  07-24-2014  dr Kathlyn [place]   Low risk [place] nuclear study with apical thinning and small inferolateral wall infarct at mid & basal level , no ischemia/  normal LVF and wall motion , ef 59%   CATARACT EXTRACTION Left    CATARACT EXTRACTION W/ INTRAOCULAR LENS IMPLANT Right    CO2 LASER APPLICATION N/A 7/51/0258   Procedure: CO2 LASER APPLICATION,PENIS, [place], ANUS;  Surgeon: [name], MD;  Location: [place];  Service: General;  Laterality: N/A;   CO2 LASER APPLICATION N/A 28/36/6294   Procedure: CO2 LASER APPLICATION;  Surgeon: Kathie Rhodes, MD;  Location: [facility];  Service: Urology;  Laterality: N/A;   CONDYLOMA EXCISION/FULGURATION N/A 05/21/2015   Procedure: CONDYLOMA REMOVAL;  Surgeon: Kathie Rhodes, MD;  Location: [facility];  Service: Urology;  Laterality: N/A;   HEMORRHOID SURGERY  10/24/2014   Procedure: HEMORRHOIDECTOMY;  Surgeon: Michael Boston, MD;  Location: [facility];  Service: General;;   INCISION AND DRAINAGE ABSCESS Left 10/24/2014   Procedure: INCISION AND DRAINAGE ABSCESS;  Surgeon: Michael Boston, MD;  Location: [place];  Service: General;  Laterality: Left;   INGUINAL HIDRADENITIS [place] N/A 03/20/2014    Procedure: EXAM UNDER ANESTHESIA, REMOVAL/ABLATION OF CONDYLOMATA PENIS,GROINS, ANUS, ANAL CANAL;  Surgeon: [name], MD;  Location: [place];  Service: General;  Laterality: N/A;  groin and anus   LASER ABLATION CONDOLAMATA N/A 10/24/2014   Procedure: LASER ABLATION CONDOLAMATA;  Surgeon: Michael Boston, MD;  Location: [place];  Service: General;  Laterality: N/A;   LASER ABLATION OF PENILE AND PERIANAL WARTS  07-29-2007  Dr. 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 His smoking use included cigars and cigarettes. He has a 41.00 pack-year smoking history. He has never used smokeless tobacco. He reports that he does not drink alcohol and does not use drugs. family history includes Cancer in his father; Diabetes in his maternal aunt; Hypertension in his mother. Allergies  Allergen Reactions   Cefepime Hives    Had allergic reaction to one of these 3 agents, unclear which one   Metoprolol Hives    Had allergic reaction to one of these 3 agents, unclear which one  *Per RN, highly likely this is the cause of allergic rxn   Myrbetriq [Mirabegron] Hives    Had allergic reaction to one of these 3 agents, unclear which one   Current Outpatient Medications on File Prior to Visit  Medication Sig Dispense Refill   aspirin EC 81 MG tablet Take 1 tablet (81 mg total) by mouth daily.      atorvastatin (LIPITOR) 40 MG tablet Take 1 tablet by mouth once daily 90 tablet 2   Blood Glucose Monitoring Suppl (ONE TOUCH ULTRA 2) w/Device KIT Use as directed to check blood sugars twice a day 1 kit 0   diltiazem (CARDIZEM CD) 360 MG 24 hr capsule Take 1 capsule by mouth once daily 30 capsule 0   insulin glargine, 2 Unit Dial, (TOUJEO MAX SOLOSTAR) 300 UNIT/ML Solostar Pen Inject 270 Units into the skin every morning. 90 mL 3   irbesartan (AVAPRO) 300 MG tablet TAKE 1 TABLET BY MOUTH  DAILY 90 tablet 3   Lancets (ONETOUCH  ULTRASOFT) lancets 1 each by Other route 2 (two) times daily. Dx E11.9 and Z79.4 180 each 3   liraglutide (VICTOZA) 18 MG/3ML SOPN Inject 0.6 mg into the skin daily. 9 mL 3   loratadine (CLARITIN) 10 MG tablet Take 10 mg by mouth daily as needed.      methocarbamol (ROBAXIN) 500 MG tablet Take 1 tablet (500 mg total) by mouth every 6 (six) hours as needed for muscle spasms. 60 tablet 0   ONETOUCH ULTRA test strip CHECK BLOOD SUGAR TWICE  DAILY 200 strip 3   pantoprazole (PROTONIX) 40 MG tablet TAKE 1 TABLET BY MOUTH  DAILY 90 tablet 3   spironolactone (ALDACTONE) 25 MG tablet Take 1 tablet by mouth once daily 90 tablet 1   chlorthalidone (HYGROTON) 25 MG tablet Take 1 tablet (25 mg total) by mouth daily. 90 tablet 2   No current facility-administered medications on file prior to visit.        ROS:  All others reviewed and negative.  Objective        PE:  BP (!) 110/52 (BP Location: Right Arm, Patient Position: Sitting, Cuff Size: Large)    Pulse (!) 57    Temp 97.7 F (36.5 C) (Oral)    Ht 5' 8"  (1.727 m)    Wt 249 lb (112.9 kg)    SpO2 96%    BMI 37.86 kg/m                 Constitutional: Pt appears in NAD               HENT: Head: NCAT.                Right Ear: External ear normal.                 Left Ear: External ear normal.                Eyes: . Pupils are equal, round, and reactive to light. Conjunctivae and EOM are normal               Nose: without d/c  or deformity               Neck: Neck supple. Gross normal ROM               Cardiovascular: Normal rate and regular rhythm.                 Pulmonary/Chest: Effort normal and breath sounds without rales or wheezing.                Abd:  Soft, NT, ND, + BS, no organomegaly               Neurological: Pt is alert. At baseline orientation, motor grossly intact               Skin: Skin is warm. No rashes, no other new lesions, LE edema - none               Psychiatric: Pt behavior is normal without agitation   Micro: none  Cardiac tracings I have personally interpreted today:  none  Pertinent Radiological findings (summarize): none   Lab Results  Component Value Date   WBC 12.9 (H) 10/25/2020   HGB 15.3 10/25/2020   HCT 43.8 10/25/2020   PLT 295.0 10/25/2020   GLUCOSE 253 (H) 05/01/2021   CHOL 102 05/01/2021   TRIG 264.0 (H) 05/01/2021   HDL  27.80 (L) 05/01/2021   LDLDIRECT 54.0 05/01/2021   LDLCALC 35 10/20/2019   ALT 34 05/01/2021   AST 29 05/01/2021   NA 134 (L) 05/01/2021   K 4.0 05/01/2021   CL 98 05/01/2021   CREATININE 1.50 05/01/2021   BUN 23 05/01/2021   CO2 27 05/01/2021   TSH 3.15 10/25/2020   PSA 1.76 10/25/2020   INR 1.1 06/04/2019   HGBA1C 9.1 (A) 07/18/2021   MICROALBUR 18.0 (H) 10/25/2020   Assessment/Plan:  [name] is a 65 y.o. White or Caucasian [1] male with  has a past medical history of Allergic rhinitis, At risk for sleep apnea, CAD (coronary artery disease), Cataract, Condyloma acuminatum of penis, Diabetic neuropathy ([place]), Diastolic dysfunction, GERD (gastroesophageal reflux disease), History of bladder cancer, History of condyloma acuminatum, History of gout, Hyperlipidemia, Hypertension, Lower urinary tract symptoms (LUTS), PAF (paroxysmal atrial fibrillation) ([place]), Productive cough, PSVT (paroxysmal supraventricular tachycardia) ([place]), PVD (peripheral vascular disease) with claudication ([place]), Smokers' cough ([place]), Type 2 diabetes mellitus  with insulin therapy ([place]) (1992), and Wears dentures.  Anxiety Increased recently now easy target for scammers; for increase celexa 20 qd, refer counseling, avoid answering phone from numbers he does not know  Diabetes [facility]) Lab Results  Component Value Date   HGBA1C 9.1 (A) 07/18/2021   uncontrolled, pt to continue current medical treatment otujeo, victoza and refer endo for better control   Essential hypertension BP Readings from Last 3 Encounters:  08/08/21 (!) 110/52  07/18/21 (!) 122/56  05/01/21 (!) 168/64   Stable, pt to continue medical treatment cardizem, aldactone, avapro  Followup: Return if symptoms worsen or fail to improve.  Cathlean Cower, MD 08/10/2021 1:10 PM [facility]

## 2021-08-08 NOTE — Patient Instructions (Signed)
Please do not send any money to persons you do not know over the internet  Ok to increase the celexa to 20 mg per day  You will be contacted regarding the referral for: counseling  Please continue all other medications as before, and refills have been done if requested.  Please have the pharmacy call with any other refills you may need.  Please continue your efforts at being more active, low cholesterol diet, and weight control.  Please keep your appointments with your specialists as you may have planned

## 2021-08-10 ENCOUNTER — Encounter: Payer: Self-pay | Admitting: Internal Medicine

## 2021-08-10 NOTE — Assessment & Plan Note (Signed)
BP Readings from Last 3 Encounters:  08/08/21 (!) 110/52  07/18/21 (!) 122/56  05/01/21 (!) 168/64   Stable, pt to continue medical treatment cardizem, aldactone, avapro

## 2021-08-10 NOTE — Assessment & Plan Note (Signed)
Increased recently now easy target for scammers; for increase celexa 20 qd, refer counseling, avoid answering phone from numbers he does not know

## 2021-08-10 NOTE — Assessment & Plan Note (Signed)
Lab Results  Component Value Date   HGBA1C 9.1 (A) 07/18/2021   uncontrolled, pt to continue current medical treatment otujeo, victoza and refer endo for better control

## 2021-08-15 ENCOUNTER — Telehealth: Payer: Self-pay | Admitting: Internal Medicine

## 2021-08-15 NOTE — Telephone Encounter (Signed)
N/A unable to leave a message for patient to call back to schedule Medicare Annual Wellness Visit   Last AWV  05/15/20  Please schedule at anytime with [facility] if patient calls the office back.    40 Minutes appointment   Any questions, please call me at [phone]

## 2021-08-16 ENCOUNTER — Telehealth: Payer: Self-pay

## 2021-08-16 NOTE — Telephone Encounter (Signed)
Patient has been given the instructions and dosage for Victoza. Also says that he found the instructions on the back of the box that may also help him.

## 2021-08-18 ENCOUNTER — Other Ambulatory Visit: Payer: Self-pay | Admitting: Cardiovascular Disease

## 2021-08-18 DIAGNOSIS — I10 Essential (primary) hypertension: Secondary | ICD-10-CM

## 2021-08-19 NOTE — Telephone Encounter (Signed)
Please schedule overdue 6 month F/U appointment. Thank you! °

## 2021-08-21 ENCOUNTER — Encounter: Payer: Self-pay | Admitting: Nurse Practitioner

## 2021-08-21 ENCOUNTER — Other Ambulatory Visit: Payer: Self-pay

## 2021-08-21 ENCOUNTER — Other Ambulatory Visit
Admission: RE | Admit: 2021-08-21 | Discharge: 2021-08-21 | Disposition: A | Discharge status: Home / Self Care | Payer: Medicare Other | Attending: Nurse Practitioner | Admitting: Nurse Practitioner

## 2021-08-21 ENCOUNTER — Ambulatory Visit: Payer: Medicare Other | Admitting: Nurse Practitioner

## 2021-08-21 VITALS — BP 139/74 | HR 68 | Ht 68.0 in | Wt 247.0 lb

## 2021-08-21 DIAGNOSIS — I10 Essential (primary) hypertension: Secondary | ICD-10-CM

## 2021-08-21 DIAGNOSIS — I471 Supraventricular tachycardia: Secondary | ICD-10-CM

## 2021-08-21 DIAGNOSIS — E782 Mixed hyperlipidemia: Secondary | ICD-10-CM | POA: Diagnosis not present

## 2021-08-21 DIAGNOSIS — I480 Paroxysmal atrial fibrillation: Secondary | ICD-10-CM | POA: Diagnosis not present

## 2021-08-21 DIAGNOSIS — I739 Peripheral vascular disease, unspecified: Secondary | ICD-10-CM

## 2021-08-21 DIAGNOSIS — Z79899 Other long term (current) drug therapy: Secondary | ICD-10-CM | POA: Diagnosis not present

## 2021-08-21 DIAGNOSIS — I2510 Atherosclerotic heart disease of native coronary artery without angina pectoris: Secondary | ICD-10-CM

## 2021-08-21 LAB — BASIC METABOLIC PANEL
Anion gap: 9 (ref 5–15)
BUN: 21 mg/dL (ref 8–23)
CO2: 25 mmol/L (ref 22–32)
Calcium: 9.6 mg/dL (ref 8.9–10.3)
Chloride: 99 mmol/L (ref 98–111)
Creatinine, Ser: 1.27 mg/dL — ABNORMAL HIGH (ref 0.61–1.24)
GFR, Estimated: >60 mL/min (ref >60)
Glucose, Bld: 81 mg/dL (ref 70–99)
Potassium: 3.9 mmol/L (ref 3.5–5.1)
Sodium: 133 mmol/L — ABNORMAL LOW (ref 135–145)

## 2021-08-21 NOTE — Progress Notes (Signed)
Office Visit    Patient Name: [name] Date of Encounter: 08/21/2021  Primary Care Provider:  Biagio Borg, MD Primary Cardiologist:  Kathlyn Sacramento, MD  Chief Complaint    66 year old male with a history of peripheral arterial disease, nonobstructive CAD, hypertension, hyperlipidemia, PSVT, insulin-dependent diabetes mellitus, remote tobacco abuse (quit 2017), and paroxysmal atrial fibrillation (less than 1% on monitoring in November 2020), who presents for follow-up related to HTN.  Past Medical History    Past Medical History:  Diagnosis Date   Allergic rhinitis    At risk for sleep apnea    STOP-BANG= 5   SENT TO PCP 03-14-2014   CAD (coronary artery disease)    a. 07/2014 low risk MV; b. 07/2019 Cor CTA (FFR): LM 25-49 (nl), LAD mild prox/mid plaque (nl), D1 25-49p(nl w/ abnl FFR of 0.73 in inf branch), LCX/OM1 mild prox/mid plaque (nl), RCA nondominant, minimal Ca2+ plaque (nl), RPDA (mildly abnl @ 0.79)-->Med Rx..   Cataract    surgically removed bilateral   Condyloma acuminatum of penis    Diabetic neuropathy ([place])    Diastolic dysfunction    a. 05/2019 Echo: EF 60-65%, no rwam, mod LVH, impaired relaxation, nl RV size/fxn, trace MR, triv TR.   GERD (gastroesophageal reflux disease)    History of bladder cancer    s/p  turbt  2013/   transitional cell carcinoma--    History of condyloma acuminatum    PERINEAL AREA  W/ RECURRENCY   History of gout    Hyperlipidemia    Hypertension    Lower urinary tract symptoms (LUTS)    PAF (paroxysmal atrial fibrillation) ([place])    a. 06/2019 Event monitor: PAF <1% burden. Longest 3 mins 36 secs.   Productive cough    PSVT (paroxysmal supraventricular tachycardia) ([place])    a. 06/2019 Event monitor: 112 episodes of SVT, longest 21 secs.   PVD (peripheral vascular disease) with claudication ([place])    a. 03/2014 LE art duplex: long segment occlusion of mid to distal R SFA; b. 03/2020 ABI: nl left and mildly improved R ABI->med  rx.   Renal artery stenosis ([facility])    Renal artery stenosis ([place])    a. 12/2020 Renal art duplex: RRA 1-59%, LRA >60%.   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  07-24-2014  dr Kathlyn Sacramento   Low risk [place] nuclear study with apical thinning and small inferolateral wall infarct at mid & basal level , no ischemia/  normal LVF and wall motion , ef 59%   CATARACT EXTRACTION Left    CATARACT EXTRACTION W/ INTRAOCULAR LENS IMPLANT Right    CO2 LASER APPLICATION N/A 1/61/0960   Procedure: CO2 LASER APPLICATION,PENIS, GROIN, ANUS;  Surgeon: [name], MD;  Location: [place];  Service: General;  Laterality: N/A;   CO2 LASER APPLICATION N/A 45/40/9811   Procedure: CO2 LASER APPLICATION;  Surgeon: Kathie Rhodes, MD;  Location: [place];  Service: Urology;  Laterality: N/A;   CONDYLOMA EXCISION/FULGURATION N/A 05/21/2015   Procedure: CONDYLOMA REMOVAL;  Surgeon: Kathie Rhodes, MD;  Location: [facility];  Service: Urology;  Laterality: N/A;   HEMORRHOID SURGERY  10/24/2014   Procedure: HEMORRHOIDECTOMY;  Surgeon:  Michael Boston, MD;  Location: [facility];  Service: General;;   INCISION AND DRAINAGE ABSCESS Left 10/24/2014   Procedure: INCISION AND DRAINAGE ABSCESS;  Surgeon: Michael Boston, MD;  Location: [place];  Service: General;  Laterality: Left;   INGUINAL HIDRADENITIS [place] N/A 03/20/2014   Procedure: EXAM UNDER ANESTHESIA, REMOVAL/ABLATION OF CONDYLOMATA PENIS,GROINS, ANUS, ANAL CANAL;  Surgeon: [name], MD;  Location: [place];  Service: General;  Laterality: N/A;  groin and anus   LASER ABLATION CONDOLAMATA N/A 10/24/2014    Procedure: LASER ABLATION CONDOLAMATA;  Surgeon: Michael Boston, MD;  Location: [place];  Service: General;  Laterality: N/A;   LASER ABLATION OF PENILE AND PERIANAL WARTS  07-29-2007  Dr. 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  05/21/2012   Procedure: TRANSURETHRAL RESECTION OF BLADDER TUMOR (TURBT);  Surgeon: Claybon Jabs, MD;  Location[facility];  Service: Urology;  Laterality: N/A;       Allergies  Allergies  Allergen Reactions   Cefepime Hives    Had allergic reaction to one of these 3 agents, unclear which one   Metoprolol Hives    Had allergic reaction to one of these 3 agents, unclear which one  *Per RN, highly likely this is the cause of allergic rxn   Myrbetriq [Mirabegron] Hives    Had allergic reaction to one of these 3 agents, unclear which one    History of Present Illness    66 year old male with the above past medical history including peripheral arterial disease, nonobstructive CAD, hypertension, hyperlipidemia, PSVT, insulin-dependent diabetes mellitus, prior tobacco abuse (quit 2017), and paroxysmal atrial fibrillation.  In the setting of right calf claudication, he underwent noninvasive evaluation in August 2015, showing an ABI of 0.44 on the right and normal ABI on the left.  Duplex showed long occlusion of the right SFA from the mid to distal segment with reconstitution in the popliteal artery.  There was no significant left SFA stenosis and he was medically managed.  In the setting of chest pain, he underwent stress testing in December 2015, which was low risk.  In October 2020, in  the setting of admission with urosepsis and pyelonephritis, he had hypertensive urgency with troponin elevation to 741.  CT of the chest was negative for PE but did show coronary calcium.  Echocardiogram showed an EF of 60-65% with moderate LVH and no significant valvular abnormalities.  He was also noted to have short runs of SVT on telemetry in the setting of electrolyte abnormalities.  Following discharge, cardiac monitoring showed 112 episodes of SVT as well as paroxysmal atrial fibrillation with less than 1% burden.  Amlodipine was switched to diltiazem and given low burden, anticoagulation was not initiated.  In light of troponin elevation during hospitalization, coronary CT angiography was performed in December 2020 and showed a calcium score of 412.  Angiography showed mild, nonobstructive disease with mildly abnormal FFR in the inferior branch of the first diagonal and RPDA.  Medical management was  recommended.  He had repeat ABIs in August 2021, which were slightly improved on the right at 0.54 and again normal on the left, and continued medical therapy was recommended.  He also underwent renal artery duplex in May 2022, which showed a 1 to 59% stenosis in the right renal artery with suggestion of greater than 60% stenosis in the left renal artery however, this was felt to be inconclusive and recommendations made for follow-up imaging with CTA or MRA in the future if blood pressure remained poorly controlled.  His hydrochlorothiazide was switched from chlorthalidone at that time.  Since his last cardiology office visit, [name] has done reasonably well from a cardiac standpoint.  Unfortunately, he is not particularly active and in that setting does not experience chest pain, dyspnea, or claudication.  He checks his blood pressure regularly at home and says it typically runs in the 638G to 665L systolic.  He recently changed how he has been taking his insulin, injecting into his thighs instead of his  abdomen, and he thinks he is absorbing it better as he is beginning to lose weight.  Last week, he is also placed on Victoza.  He denies palpitations, PND, orthopnea, dizziness, syncope, edema, or early satiety.  Home Medications    Current Outpatient Medications  Medication Sig Dispense Refill   aspirin EC 81 MG tablet Take 1 tablet (81 mg total) by mouth daily.     atorvastatin (LIPITOR) 40 MG tablet Take 1 tablet by mouth once daily 90 tablet 2   Blood Glucose Monitoring Suppl (ONE TOUCH ULTRA 2) w/Device KIT Use as directed to check blood sugars twice a day 1 kit 0   chlorthalidone (HYGROTON) 25 MG tablet TAKE 1 TABLET BY MOUTH  DAILY 90 tablet 0   citalopram (CELEXA) 20 MG tablet Take 1 tablet (20 mg total) by mouth daily. 90 tablet 3   diltiazem (CARDIZEM CD) 360 MG 24 hr capsule Take 1 capsule by mouth once daily 30 capsule 0   insulin glargine, 2 Unit Dial, (TOUJEO MAX SOLOSTAR) 300 UNIT/ML Solostar Pen Inject 270 Units into the skin every morning. 90 mL 3   irbesartan (AVAPRO) 300 MG tablet TAKE 1 TABLET BY MOUTH  DAILY 90 tablet 3   Lancets (ONETOUCH ULTRASOFT) lancets 1 each by Other route 2 (two) times daily. Dx E11.9 and Z79.4 180 each 3   liraglutide (VICTOZA) 18 MG/3ML SOPN Inject 0.6 mg into the skin daily. 9 mL 3   loratadine (CLARITIN) 10 MG tablet Take 10 mg by mouth daily as needed.      methocarbamol (ROBAXIN) 500 MG tablet Take 1 tablet (500 mg total) by mouth every 6 (six) hours as needed for muscle spasms. 60 tablet 0   ONETOUCH ULTRA test strip CHECK BLOOD SUGAR TWICE  DAILY 200 strip 3   pantoprazole (PROTONIX) 40 MG tablet TAKE 1 TABLET BY MOUTH  DAILY 90 tablet 3   spironolactone (ALDACTONE) 25 MG tablet Take 1 tablet by mouth once daily 90 tablet 1   No current facility-administered medications for this visit.     Review of Systems    He denies chest pain, dyspnea, palpitations, PND, orthopnea, dizziness, syncope, edema, or early satiety.  All other systems  reviewed and are otherwise negative except as noted above.    Physical Exam    VS:  BP 139/74    Pulse 68    Ht 5' 8" (1.727 m)    Wt 247 lb (112 kg)  SpO2 96%    BMI 37.56 kg/m  , BMI Body mass index is 37.56 kg/m.     GEN: Obese, in no acute distress. HEENT: normal. Neck: Supple, no JVD, carotid bruits, or masses. Cardiac: RRR, no murmurs, rubs, or gallops. No clubbing, cyanosis, edema.  Radials/PT 1+ and equal bilaterally.  Respiratory:  Respirations regular and unlabored, clear to auscultation bilaterally. GI: Obese, soft, nontender, nondistended, BS + x 4. MS: no deformity or atrophy. Skin: warm and dry, no rash. Neuro:  Strength and sensation are intact. Psych: Normal affect.  Accessory Clinical Findings    ECG personally reviewed by me today -regular sinus rhythm, 68, PACs, delayed R wave progression- no acute changes.  Lab Results  Component Value Date   WBC 12.9 (H) 10/25/2020   HGB 15.3 10/25/2020   HCT 43.8 10/25/2020   MCV 92.9 10/25/2020   PLT 295.0 10/25/2020   Lab Results  Component Value Date   CREATININE 1.27 (H) 08/21/2021   BUN 21 08/21/2021   NA 133 (L) 08/21/2021   K 3.9 08/21/2021   CL 99 08/21/2021   CO2 25 08/21/2021   Lab Results  Component Value Date   ALT 34 05/01/2021   AST 29 05/01/2021   ALKPHOS 83 05/01/2021   BILITOT 0.7 05/01/2021   Lab Results  Component Value Date   CHOL 102 05/01/2021   HDL 27.80 (L) 05/01/2021   LDLCALC 35 10/20/2019   LDLDIRECT 54.0 05/01/2021   TRIG 264.0 (H) 05/01/2021   CHOLHDL 4 05/01/2021    Lab Results  Component Value Date   HGBA1C 9.1 (A) 07/18/2021    Assessment & Plan    1.  Peripheral arterial disease: Stable ABIs in August 2021.  Known occluded right SFA, which has been medically managed.  He denies any claudication however, has not been walking regularly.  I encouraged him to begin doing so.  He remains on aspirin and statin therapy.  2.  Nonobstructive coronary artery disease:  Coronary CTA in December 2020 with mild left main and diagonal disease with abnormal FFR in the inferior branch of diagonal.  He has been medically managed.  He denies chest pain or dyspnea.  Encourage regular exercise.  He remains on aspirin and statin therapy.  3.  PSVT: Quiescent on diltiazem therapy.  4.  Paroxysmal atrial fibrillation: Less than 1% burden noted on previous monitoring.  Quiescent.  Given very low burden, he is not on oral anticoagulation.  Continue diltiazem.  5.  Essential hypertension: Blood pressure mildly elevated today at 139/74.  Home blood pressure recordings reviewed and typically running in the 1 20-1 30 range.  Blood pressures on recent office visits have mostly trended similarly with a pressure of 110/52 at a primary care visit in December.  Continue current medical regimen which includes chlorthalidone, diltiazem, irbesartan, and spironolactone.  He is due for refills on spironolactone and chlorthalidone.  I did follow-up basic metabolic panel given acute kidney injury in September with a creatinine of 1.5 at that time.  Creatinine 1.27 today.  6.  Hyperlipidemia: Remains on statin therapy with an LDL of 54 in September.  LFTs were normal at that time.  7.  Type 2 diabetes mellitus: A1c 9.1 in December.  He was recently placed on liraglutide and has begun losing some weight.  Management per primary care.  8.  Disposition: Follow-up in clinic in 6 months or sooner if necessary.   Murray Hodgkins, NP 08/21/2021, 5:37 PM

## 2021-08-21 NOTE — Patient Instructions (Signed)
Medication Instructions:  - Your physician recommends that you continue on your current medications as directed. Please refer to the Current Medication list given to you today.  *If you need a refill on your cardiac medications before your next appointment, please call your pharmacy*   Lab Work: - Your physician recommends that you have lab work today: BMP  If you have labs (blood work) drawn today and your tests are completely normal, you will receive your results only by: MyChart Message (if you have MyChart) OR A paper copy in the mail If you have any lab test that is abnormal or we need to change your treatment, we will call you to review the results.   Testing/Procedures: - none ordered   Follow-Up: At [facility], you and your health needs are our priority.  As part of our continuing mission to provide you with exceptional heart care, we have created designated Provider Care Teams.  These Care Teams include your primary Cardiologist (physician) and Advanced Practice Providers (APPs -  Physician Assistants and Nurse Practitioners) who all work together to provide you with the care you need, when you need it.  We recommend signing up for the patient portal called "MyChart".  Sign up information is provided on this After Visit Summary.  MyChart is used to connect with patients for Virtual Visits (Telemedicine).  Patients are able to view lab/test results, encounter notes, upcoming appointments, etc.  Non-urgent messages can be sent to your provider as well.   To learn more about what you can do with MyChart, go to [email or web address].    Your next appointment:   6 month(s)  The format for your next appointment:   In Person  Provider:   Kathlyn Sacramento, MD    Other Instructions N/a

## 2021-08-22 ENCOUNTER — Telehealth: Payer: Self-pay | Admitting: *Deleted

## 2021-08-22 NOTE — Telephone Encounter (Signed)
Spoke with patient and reviewed results and recommendations. He verbalized understanding with no further questions at this time.  

## 2021-08-22 NOTE — Telephone Encounter (Signed)
Left voicemail message to call back for results.  

## 2021-08-22 NOTE — Telephone Encounter (Signed)
-----   Message from Theora Gianotti, NP sent at 08/21/2021  5:57 PM EST ----- Kidney function improved since September and stable compared to June.  Sodium is mildly low.  Potassium normal.  Continue current medicines.

## 2021-08-22 NOTE — Telephone Encounter (Signed)
Patient calling to discuss recent testing results  ° °Please call  ° °

## 2021-08-23 ENCOUNTER — Ambulatory Visit (INDEPENDENT_AMBULATORY_CARE_PROVIDER_SITE_OTHER): Payer: Medicare Other

## 2021-08-23 ENCOUNTER — Other Ambulatory Visit: Payer: Self-pay

## 2021-08-23 VITALS — BP 120/60 | HR 62 | Temp 97.6°F | Ht 68.0 in | Wt 247.0 lb

## 2021-08-23 DIAGNOSIS — Z0000 Encounter for general adult medical examination without abnormal findings: Secondary | ICD-10-CM | POA: Diagnosis not present

## 2021-08-23 DIAGNOSIS — E1151 Type 2 diabetes mellitus with diabetic peripheral angiopathy without gangrene: Secondary | ICD-10-CM | POA: Diagnosis not present

## 2021-08-23 DIAGNOSIS — Z794 Long term (current) use of insulin: Secondary | ICD-10-CM

## 2021-08-23 DIAGNOSIS — K921 Melena: Secondary | ICD-10-CM

## 2021-08-23 DIAGNOSIS — Z1211 Encounter for screening for malignant neoplasm of colon: Secondary | ICD-10-CM

## 2021-08-23 NOTE — Patient Instructions (Signed)
[name] , Thank you for taking time to come for your Medicare Wellness Visit. I appreciate your ongoing commitment to your health goals. Please review the following plan we discussed and let me know if I can assist you in the future.   Screening recommendations/referrals: Colonoscopy: 07/22/2018; due every 3 years (overdue) Recommended yearly ophthalmology/optometry visit for glaucoma screening and checkup Recommended yearly dental visit for hygiene and checkup  Vaccinations: Influenza vaccine: 05/01/2021 Pneumococcal vaccine: 05/05/2016 Tdap vaccine: 06/05/2016; due every 10 years Shingles vaccine: never done   Covid-19: 11/23/2019, 12/14/2019  Advanced directives: Please bring a copy of your health care power of attorney and living will to the office at your convenience.  Conditions/risks identified: Yes; Client understands the importance of follow-up with providers by attending scheduled visits and discussed goals to eat healthier, increase physical activity, exercise the brain, socialize more, get enough sleep and make time for laughter.  Next appointment: Please schedule your next Medicare Wellness Visit with your Nurse Health Advisor in 1 year by calling [phone].  Preventive Care 65 Years and Older, Male Preventive care refers to lifestyle choices and visits with your health care provider that can promote health and wellness. What does preventive care include? A yearly physical exam. This is also called an annual well check. Dental exams once or twice a year. Routine eye exams. Ask your health care provider how often you should have your eyes checked. Personal lifestyle choices, including: Daily care of your teeth and gums. Regular physical activity. Eating a healthy diet. Avoiding tobacco and drug use. Limiting alcohol use. Practicing safe sex. Taking low doses of aspirin every day. Taking vitamin and mineral supplements as recommended by your health care provider. What  happens during an annual well check? The services and screenings done by your health care provider during your annual well check will depend on your age, overall health, lifestyle risk factors, and family history of disease. Counseling  Your health care provider may ask you questions about your: Alcohol use. Tobacco use. Drug use. Emotional well-being. Home and relationship well-being. Sexual activity. Eating habits. History of falls. Memory and ability to understand (cognition). Work and work Statistician. Screening  You may have the following tests or measurements: Height, weight, and BMI. Blood pressure. Lipid and cholesterol levels. These may be checked every 5 years, or more frequently if you are over 76 years old. Skin check. Lung cancer screening. You may have this screening every year starting at age 48 if you have a 30-pack-year history of smoking and currently smoke or have quit within the past 15 years. Fecal occult blood test (FOBT) of the stool. You may have this test every year starting at age 10. Flexible sigmoidoscopy or colonoscopy. You may have a sigmoidoscopy every 5 years or a colonoscopy every 10 years starting at age 5. Prostate cancer screening. Recommendations will vary depending on your family history and other risks. Hepatitis C blood test. Hepatitis B blood test. Sexually transmitted disease (STD) testing. Diabetes screening. This is done by checking your blood sugar (glucose) after you have not eaten for a while (fasting). You may have this done every 1-3 years. Abdominal aortic aneurysm (AAA) screening. You may need this if you are a current or former smoker. Osteoporosis. You may be screened starting at age 74 if you are at high risk. Talk with your health care provider about your test results, treatment options, and if necessary, the need for more tests. Vaccines  Your health care provider may recommend certain  vaccines, such as: Influenza vaccine. This  is recommended every year. Tetanus, diphtheria, and acellular pertussis (Tdap, Td) vaccine. You may need a Td booster every 10 years. Zoster vaccine. You may need this after age 13. Pneumococcal 13-valent conjugate (PCV13) vaccine. One dose is recommended after age 57. Pneumococcal polysaccharide (PPSV23) vaccine. One dose is recommended after age 80. Talk to your health care provider about which screenings and vaccines you need and how often you need them. This information is not intended to replace advice given to you by your health care provider. Make sure you discuss any questions you have with your health care provider. Document Released: 08/24/2015 Document Revised: 04/16/2016 Document Reviewed: 05/29/2015 Elsevier Interactive Patient Education  2017 [place] Prevention in the Home Falls can cause injuries. They can happen to people of all ages. There are many things you can do to make your home safe and to help prevent falls. What can I do on the outside of my home? Regularly fix the edges of walkways and driveways and fix any cracks. Remove anything that might make you trip as you walk through a door, such as a raised step or threshold. Trim any bushes or trees on the path to your home. Use bright outdoor lighting. Clear any walking paths of anything that might make someone trip, such as rocks or tools. Regularly check to see if handrails are loose or broken. Make sure that both sides of any steps have handrails. Any raised decks and porches should have guardrails on the edges. Have any leaves, snow, or ice cleared regularly. Use sand or salt on walking paths during winter. Clean up any spills in your garage right away. This includes oil or grease spills. What can I do in the bathroom? Use night lights. Install grab bars by the toilet and in the tub and shower. Do not use towel bars as grab bars. Use non-skid mats or decals in the tub or shower. If you need to sit down  in the shower, use a plastic, non-slip stool. Keep the floor dry. Clean up any water that spills on the floor as soon as it happens. Remove soap buildup in the tub or shower regularly. Attach bath mats securely with double-sided non-slip rug tape. Do not have throw rugs and other things on the floor that can make you trip. What can I do in the bedroom? Use night lights. Make sure that you have a light by your bed that is easy to reach. Do not use any sheets or blankets that are too big for your bed. They should not hang down onto the floor. Have a firm chair that has side arms. You can use this for support while you get dressed. Do not have throw rugs and other things on the floor that can make you trip. What can I do in the kitchen? Clean up any spills right away. Avoid walking on wet floors. Keep items that you use a lot in easy-to-reach places. If you need to reach something above you, use a strong step stool that has a grab bar. Keep electrical cords out of the way. Do not use floor polish or wax that makes floors slippery. If you must use wax, use non-skid floor wax. Do not have throw rugs and other things on the floor that can make you trip. What can I do with my stairs? Do not leave any items on the stairs. Make sure that there are handrails on both sides of the stairs  and use them. Fix handrails that are broken or loose. Make sure that handrails are as long as the stairways. Check any carpeting to make sure that it is firmly attached to the stairs. Fix any carpet that is loose or worn. Avoid having throw rugs at the top or bottom of the stairs. If you do have throw rugs, attach them to the floor with carpet tape. Make sure that you have a light switch at the top of the stairs and the bottom of the stairs. If you do not have them, ask someone to add them for you. What else can I do to help prevent falls? Wear shoes that: Do not have high heels. Have rubber bottoms. Are comfortable  and fit you well. Are closed at the toe. Do not wear sandals. If you use a stepladder: Make sure that it is fully opened. Do not climb a closed stepladder. Make sure that both sides of the stepladder are locked into place. Ask someone to hold it for you, if possible. Clearly mark and make sure that you can see: Any grab bars or handrails. First and last steps. Where the edge of each step is. Use tools that help you move around (mobility aids) if they are needed. These include: Canes. Walkers. Scooters. Crutches. Turn on the lights when you go into a dark area. Replace any light bulbs as soon as they burn out. Set up your furniture so you have a clear path. Avoid moving your furniture around. If any of your floors are uneven, fix them. If there are any pets around you, be aware of where they are. Review your medicines with your doctor. Some medicines can make you feel dizzy. This can increase your chance of falling. Ask your doctor what other things that you can do to help prevent falls. This information is not intended to replace advice given to you by your health care provider. Make sure you discuss any questions you have with your health care provider. Document Released: 05/24/2009 Document Revised: 01/03/2016 Document Reviewed: 09/01/2014 Elsevier Interactive Patient Education  2017 [employer].

## 2021-08-23 NOTE — Progress Notes (Signed)
Subjective:   [name] is a 66 y.o. male who presents for Medicare Annual/Subsequent preventive examination.  Review of Systems     Cardiac Risk Factors include: advanced age (>65mn, >>24women);dyslipidemia;family history of premature cardiovascular disease;hypertension;male gender;obesity (BMI >30kg/m2)     Objective:    Today's Vitals   08/23/21 1258  BP: 120/60  Pulse: 62  Temp: 97.6 F (36.4 C)  TempSrc: Temporal  SpO2: 95%  Weight: 247 lb (112 kg)  Height: 5' 8" (1.727 m)  PainSc: 0-No pain   Body mass index is 37.56 kg/m.  Advanced Directives 02/19/2021 05/15/2020 06/20/2019 06/05/2019 06/04/2019 07/22/2018 09/10/2017  Does Patient Have a Medical Advance Directive? No No No Yes Yes No No  Type of Advance Directive - - -Engineer, production- -  Does patient want to make changes to medical advance directive? - - Yes (MAU/Ambulatory/Procedural Areas - Information given) No - Patient declined - - -  [place] of [place]? - - - No - copy requested - - -  Would patient like information on creating a medical advance directive? No - Patient declined No - Patient declined - - - - -    Current Medications (verified) Outpatient Encounter Medications as of 08/23/2021  Medication Sig   aspirin EC 81 MG tablet Take 1 tablet (81 mg total) by mouth daily.   atorvastatin (LIPITOR) 40 MG tablet Take 1 tablet by mouth once daily   Blood Glucose Monitoring Suppl (ONE TOUCH ULTRA 2) w/Device KIT Use as directed to check blood sugars twice a day   chlorthalidone (HYGROTON) 25 MG tablet TAKE 1 TABLET BY MOUTH  DAILY   citalopram (CELEXA) 20 MG tablet Take 1 tablet (20 mg total) by mouth daily.   diltiazem (CARDIZEM CD) 360 MG 24 hr capsule Take 1 capsule by mouth once daily   insulin glargine, 2 Unit Dial, (TOUJEO MAX SOLOSTAR) 300 UNIT/ML Solostar Pen Inject 270 Units into the skin every morning.   irbesartan (AVAPRO) 300  MG tablet TAKE 1 TABLET BY MOUTH  DAILY   Lancets (ONETOUCH ULTRASOFT) lancets 1 each by Other route 2 (two) times daily. Dx E11.9 and Z79.4   liraglutide (VICTOZA) 18 MG/3ML SOPN Inject 0.6 mg into the skin daily.   loratadine (CLARITIN) 10 MG tablet Take 10 mg by mouth daily as needed.    methocarbamol (ROBAXIN) 500 MG tablet Take 1 tablet (500 mg total) by mouth every 6 (six) hours as needed for muscle spasms.   ONETOUCH ULTRA test strip CHECK BLOOD SUGAR TWICE  DAILY   pantoprazole (PROTONIX) 40 MG tablet TAKE 1 TABLET BY MOUTH  DAILY   spironolactone (ALDACTONE) 25 MG tablet Take 1 tablet by mouth once daily   No facility-administered encounter medications on file as of 08/23/2021.    Allergies (verified) Cefepime, Metoprolol, and Myrbetriq [mirabegron]   History: Past Medical History:  Diagnosis Date   Allergic rhinitis    At risk for sleep apnea    STOP-BANG= 5   SENT TO PCP 03-14-2014   CAD (coronary artery disease)    a. 07/2014 low risk MV; b. 07/2019 Cor CTA (FFR): LM 25-49 (nl), LAD mild prox/mid plaque (nl), D1 25-49p(nl w/ abnl FFR of 0.73 in inf branch), LCX/OM1 mild prox/mid plaque (nl), RCA nondominant, minimal Ca2+ plaque (nl), RPDA (mildly abnl @ 0.79)-->Med Rx..   Cataract    surgically removed bilateral   Condyloma acuminatum of penis  Diabetic neuropathy (HCC)    Diastolic dysfunction    a. 05/2019 Echo: EF 60-65%, no rwam, mod LVH, impaired relaxation, nl RV size/fxn, trace MR, triv TR.   GERD (gastroesophageal reflux disease)    History of bladder cancer    s/p  turbt  2013/   transitional cell carcinoma--    History of condyloma acuminatum    PERINEAL AREA  W/ RECURRENCY   History of gout    Hyperlipidemia    Hypertension    Lower urinary tract symptoms (LUTS)    PAF (paroxysmal atrial fibrillation) ([place])    a. 06/2019 Event monitor: PAF <1% burden. Longest 3 mins 36 secs.   Productive cough    PSVT (paroxysmal supraventricular tachycardia) ([place])     a. 06/2019 Event monitor: 112 episodes of SVT, longest 21 secs.   PVD (peripheral vascular disease) with claudication ([place])    a. 03/2014 LE art duplex: long segment occlusion of mid to distal R SFA; b. 03/2020 ABI: nl left and mildly improved R ABI->med rx.   Renal artery stenosis (HCC)    Renal artery stenosis ([place])    a. 12/2020 Renal art duplex: RRA 1-59%, LRA >60%.   Smokers' cough ([place])    Type 2 diabetes mellitus with insulin therapy ([place]) 1992   monitor by  dr ellsion   Wears dentures    upper   Past Surgical History:  Procedure Laterality Date   AXILLARY HIDRADENITIS IXL TEST  07-24-2014  dr Kathlyn [place]   Low risk Benedict nuclear study with apical thinning and small inferolateral wall infarct at mid & basal level , no ischemia/  normal LVF and wall motion , ef 59%   CATARACT EXTRACTION Left    CATARACT EXTRACTION W/ INTRAOCULAR LENS IMPLANT Right    CO2 LASER APPLICATION N/A 5/62/1308   Procedure: CO2 LASER APPLICATION,PENIS, GROIN, ANUS;  Surgeon: [name], MD;  Location: [place];  Service: General;  Laterality: N/A;   CO2 LASER APPLICATION N/A 65/78/4696   Procedure: CO2 LASER APPLICATION;  Surgeon: Kathie Rhodes, MD;  Location: [place];  Service: Urology;  Laterality: N/A;   CONDYLOMA EXCISION/FULGURATION N/A 05/21/2015   Procedure: CONDYLOMA REMOVAL;  Surgeon: Kathie Rhodes, MD;  Location[facility];  Service: Urology;  Laterality: N/A;   HEMORRHOID SURGERY  10/24/2014   Procedure: HEMORRHOIDECTOMY;  Surgeon: Michael Boston, MD;  Location: [facility];  Service: General;;   INCISION AND DRAINAGE ABSCESS Left 10/24/2014   Procedure: INCISION AND DRAINAGE ABSCESS;  Surgeon: Michael Boston, MD;  Location: [place];  Service: General;  Laterality: Left;   INGUINAL HIDRADENITIS [place] N/A 03/20/2014   Procedure:  EXAM UNDER ANESTHESIA, REMOVAL/ABLATION OF CONDYLOMATA PENIS,GROINS, ANUS, ANAL CANAL;  Surgeon: [name], MD;  Location: [place];  Service: General;  Laterality: N/A;  groin and anus   LASER ABLATION CONDOLAMATA N/A 10/24/2014   Procedure: LASER ABLATION CONDOLAMATA;  Surgeon: Michael Boston, MD;  Location: [place];  Service: General;  Laterality: N/A;   LASER ABLATION OF PENILE AND PERIANAL WARTS  07-29-2007  Dr. Johney Maine   LEFT SHOULDER SURGERY  2003   MASS EXCISION N/A 10/24/2014   Procedure: EXCISION OF PERINEAL MASS/SINUS;  Surgeon: Michael Boston, MD;  Location: [place];  Service: General;  Laterality: N/A;   MOHS SURGERY     back  MOHS SURGERY  2017   face   MULTIPLE TOOTH EXTRACTIONS     PERINEAL HIDRADENITIS EXCISION  1998, 1999   TRANSURETHRAL RESECTION OF BLADDER TUMOR  05/21/2012   Procedure: TRANSURETHRAL RESECTION OF BLADDER TUMOR (TURBT);  Surgeon: Claybon Jabs, MD;  Location[facility];  Service: Urology;  Laterality: N/A;      Family History  Problem Relation Age of Onset   Hypertension Mother    Cancer Father        lung ca   Diabetes Maternal Aunt        x 2   Colon cancer Neg Hx    Esophageal cancer Neg Hx    Pancreatic cancer Neg Hx    Prostate cancer Neg Hx    Kidney disease Neg Hx    Liver disease Neg Hx    Lung cancer Neg Hx    Rectal cancer Neg Hx    Stomach cancer Neg Hx    Social History   Socioeconomic History   Marital status: Married    Spouse name: Not on file   Number of children: Not on file   Years of education: Not on file   Highest education level: Not on file  Occupational History   Occupation: Disabled  Tobacco Use   Smoking status: Former    Packs/day: 1.00    Years: 41.00    Pack years: 41.00    Types: Cigars, Cigarettes    Quit date: 04/11/2016    Years since quitting: 5.3   Smokeless tobacco: Never  Vaping Use   Vaping Use: Former  Substance and  Sexual Activity   Alcohol use: No    Alcohol/week: 0.0 standard drinks   Drug use: No   Sexual activity: Not on file  Other Topics Concern   Not on file  Social History Narrative   Not on file   Social Determinants of Health   Financial Resource Strain: Low Risk    Difficulty of Paying Living Expenses: Not hard at all  Food Insecurity: No Food Insecurity   Worried About Charity fundraiser in the Last Year: Never true   [place] in the Last Year: Never true  Transportation Needs: No Transportation Needs   Lack of Transportation (Medical): No   Lack of Transportation (Non-Medical): No  Physical Activity: Inactive   Days of Exercise per Week: 0 days   Minutes of Exercise per Session: 0 min  Stress: No Stress Concern Present   Feeling of Stress : Not at all  Social Connections: Moderately Integrated   Frequency of Communication with Friends and Family: More than three times a week   Frequency of Social Gatherings with Friends and Family: Never   Attends Religious Services: Never   Marine scientist or Organizations: Yes   Attends Archivist Meetings: Never   Marital Status: Married    Tobacco Counseling Counseling given: Not Answered   Clinical Intake:  Pre-visit preparation completed: Yes  Pain : No/denies pain Pain Score: 0-No pain     BMI - recorded: 37.56 Nutritional Status: BMI > 30  Obese Nutritional Risks: None Diabetes: Yes CBG done?: Yes CBG resulted in Enter/ Edit results?: Yes (89 fasting) Did pt. bring in CBG monitor from home?: No  How often do you need to have someone help you when you read instructions, pamphlets, or other written materials from your doctor or pharmacy?: 1 - Never What is the last grade level you completed in  school?: 11 grade; Retired IT sales professional  Diabetic? yes  Interpreter Needed?: No  Information entered by :: Lisette Abu, LPN   Activities of Daily Living In your present state of health, do you  have any difficulty performing the following activities: 08/23/2021  Hearing? N  Vision? N  Difficulty concentrating or making decisions? N  Walking or climbing stairs? N  Dressing or bathing? N  Doing errands, shopping? N  Preparing Food and eating ? N  Using the Toilet? N  In the past six months, have you accidently leaked urine? N  Do you have problems with loss of bowel control? N  Managing your Medications? N  Managing your Finances? N  Housekeeping or managing your Housekeeping? N  Some recent data might be hidden    Patient Care Team: Biagio Borg, MD as PCP - General (Internal Medicine) Wellington Hampshire, MD as PCP - Cardiology (Cardiology) Kathie Rhodes, MD (Inactive) as Consulting Physician (Urology) Wellington Hampshire, MD as Consulting Physician (Cardiology) Michael Boston, MD as Consulting Physician (General Surgery) Lynnell Dike, [place] (Optometry) [name], MD as Consulting Physician (Endocrinology)  Indicate any recent Medical Services you may have received from other than [facility] providers in the past year (date may be approximate).     Assessment:   This is a routine wellness examination for [facility].  Hearing/Vision screen Hearing Screening - Comments:: Patient denied any hearing difficulty.   No hearing aids.  Vision Screening - Comments:: Patient wears corrective glasses/contacts.  Eye exam done annually by: Cleon Gustin, OD.  Dietary issues and exercise activities discussed: Current Exercise Habits: The patient does not participate in regular exercise at present, Exercise limited by: orthopedic condition(s);cardiac condition(s);respiratory conditions(s)   Goals Addressed               This Visit's Progress     Patient Stated (pt-stated)        Patient declined health goal at this time.      Depression Screen PHQ 2/9 Scores 08/23/2021 05/01/2021 10/25/2020 05/15/2020 10/20/2019 06/20/2019 04/21/2019  PHQ - 2 Score 0 0 0 0 1 1 0    Fall Risk Fall Risk   08/23/2021 05/01/2021 10/25/2020 05/15/2020 04/26/2020  Falls in the past year? 0 0 0 0 0  Number falls in past yr: 0 0 - 0 0  Injury with Fall? 0 0 - 0 0  Risk for fall due to : No Fall Risks - - History of fall(s) History of fall(s)  Follow up Falls evaluation completed - - Falls evaluation completed Falls evaluation completed    Bartholomew:  Any stairs in or around the home? No  If so, are there any without handrails? No  Home free of loose throw rugs in walkways, pet beds, electrical cords, etc? Yes  Adequate lighting in your home to reduce risk of falls? Yes   ASSISTIVE DEVICES UTILIZED TO PREVENT FALLS:  Life alert? No  Use of a cane, walker or w/c? No  Grab bars in the bathroom? No  Shower chair or bench in shower? No  Elevated toilet seat or a handicapped toilet? No   TIMED UP AND GO:  Was the test performed? Yes .  Length of time to ambulate 10 feet: 6 sec.   Gait steady and fast without use of assistive device  Cognitive Function: Normal cognitive status assessed by direct observation by this Nurse Health Advisor. No abnormalities found.  Immunizations Immunization History  Administered Date(s) Administered   Fluad Quad(high Dose 65+) 04/21/2019   Influenza Split 07/07/2011, 06/08/2012   Influenza Whole 05/04/2008, 05/22/2009, 05/02/2010   Influenza,inj,Quad PF,6+ Mos 06/06/2013, 05/05/2014, 07/02/2015, 05/05/2016, 06/15/2018, 04/26/2020, 05/01/2021   PFIZER(Purple Top)SARS-COV-2 Vaccination 11/23/2019, 12/14/2019   Pneumococcal Polysaccharide-23 01/10/1996, 05/05/2016   Td 02/09/2000   Tdap 02/10/2011, 06/05/2016    TDAP status: Up to date  Flu Vaccine status: Up to date  Pneumococcal vaccine status: Due, Education has been provided regarding the importance of this vaccine. Advised may receive this vaccine at local pharmacy or Health Dept. Aware to provide a copy of the vaccination record if obtained from local  pharmacy or Health Dept. Verbalized acceptance and understanding.  Covid-19 vaccine status: Completed vaccines  Qualifies for Shingles Vaccine? Yes   Zostavax completed No   Shingrix Completed?: No.    Education has been provided regarding the importance of this vaccine. Patient has been advised to call insurance company to determine out of pocket expense if they have not yet received this vaccine. Advised may also receive vaccine at local pharmacy or Health Dept. Verbalized acceptance and understanding.  Screening Tests Health Maintenance  Topic Date Due   Zoster Vaccines- Shingrix (1 of 2) Never done   Pneumococcal Vaccine 46-82 Years old (3 - PCV) 05/05/2017   COVID-19 Vaccine (3 - Pfizer risk series) 01/11/2020   COLONOSCOPY (Pts 45-28yr Insurance coverage will need to be confirmed)  07/22/2021   FOOT EXAM  11/09/2021   OPHTHALMOLOGY EXAM  11/13/2021   HEMOGLOBIN A1C  01/16/2022   TETANUS/TDAP  06/05/2026   INFLUENZA VACCINE  Completed   Hepatitis C Screening  Completed   HIV Screening  Completed   HPV VACCINES  Aged Out    Health Maintenance  Health Maintenance Due  Topic Date Due   Zoster Vaccines- Shingrix (1 of 2) Never done   Pneumococcal Vaccine 161625Years old (3 - PCV) 05/05/2017   COVID-19 Vaccine (3 - Pfizer risk series) 01/11/2020   COLONOSCOPY (Pts 45-445yrInsurance coverage will need to be confirmed)  07/22/2021    Colorectal cancer screening: Referral to GI placed 08/24/2011. Pt aware the office will call re: appt.  Lung Cancer Screening: (Low Dose CT Chest recommended if Age 65-80ears, 30 pack-year currently smoking OR have quit w/in 15years.) does not qualify.   Lung Cancer Screening Referral: no  Additional Screening:  Hepatitis C Screening: does qualify; Completed yes  Vision Screening: Recommended annual ophthalmology exams for early detection of glaucoma and other disorders of the eye. Is the patient up to date with their annual eye exam?  Yes   Who is the provider or what is the name of the office in which the patient attends annual eye exams? [facility]. If pt is not established with a provider, would they like to be referred to a provider to establish care? No .   Dental Screening: Recommended annual dental exams for proper oral hygiene  Community Resource Referral / Chronic Care Management: CRR required this visit?  No   CCM required this visit?  No      Plan:     I have personally reviewed and noted the following in the patients chart:   Medical and social history Use of alcohol, tobacco or illicit drugs  Current medications and supplements including opioid prescriptions. Patient is not currently taking opioid prescriptions. Functional ability and status Nutritional status Physical activity Advanced directives List of other physicians Hospitalizations, surgeries, and ER visits  in previous 12 months Vitals Screenings to include cognitive, depression, and falls Referrals and appointments  In addition, I have reviewed and discussed with patient certain preventive protocols, quality metrics, and best practice recommendations. A written personalized care plan for preventive services as well as general preventive health recommendations were provided to patient.     Sheral Flow, LPN   8/92/1194   Nurse Notes:  Hearing Screening - Comments:: Patient denied any hearing difficulty.   No hearing aids.  Vision Screening - Comments:: Patient wears corrective glasses/contacts.  Eye exam done annually by: Cleon Gustin, OD.

## 2021-08-26 ENCOUNTER — Other Ambulatory Visit: Payer: Self-pay

## 2021-08-26 ENCOUNTER — Telehealth: Payer: Self-pay | Admitting: Cardiovascular Disease

## 2021-08-26 ENCOUNTER — Telehealth: Payer: Self-pay | Admitting: Internal Medicine

## 2021-08-26 MED ORDER — SPIRONOLACTONE 25 MG PO TABS: 25.0000 mg | ORAL_TABLET | Freq: Every day | ORAL | 3 refills | Status: DC

## 2021-08-26 NOTE — Telephone Encounter (Signed)
spironolactone (ALDACTONE) 25 MG tablet 90 tablet 3 08/26/2021    Sig - Route: Take 1 tablet (25 mg total) by mouth daily. - Oral   Sent to pharmacy as: spironolactone (ALDACTONE) 25 MG tablet   E-Prescribing Status: Sent to pharmacy (08/26/2021  1:45 PM EST)    Pharmacy  [facility] [place], [place]

## 2021-08-26 NOTE — Chronic Care Management (AMB) (Signed)
°  Chronic Care Management   Outreach Note  08/26/2021 Name: [name] MRN: [id number] DOB: 1956/01/01  Referred by: Biagio Borg, MD Reason for referral : No chief complaint on file.   An unsuccessful telephone outreach was attempted today. The patient was referred to the pharmacist for assistance with care management and care coordination.   Follow Up Plan:   Tatjana Dellinger Upstream Scheduler

## 2021-08-26 NOTE — Telephone Encounter (Signed)
°*  STAT* If patient is at the pharmacy, call can be transferred to refill team.   1. Which medications need to be refilled? (please list name of each medication and dose if known) spironolactone 25 MG 1 tablet daily   2. Which pharmacy/location (including street and city if local pharmacy) is medication to be sent to? [facility] in [place]  3. Do they need a 30 day or 90 day supply? 90 day

## 2021-08-27 ENCOUNTER — Telehealth: Payer: Self-pay | Admitting: Internal Medicine

## 2021-08-27 NOTE — Progress Notes (Signed)
°  Chronic Care Management   Note  08/27/2021 Name: [name] MRN: [id number] DOB: [birth year]  [name] is a 66 y.o. year old male who is a primary care patient of Biagio Borg, MD. I reached out to [name] by phone today in response to a referral sent by [name]'s PCP, Biagio Borg, MD.   [name] was given information about Chronic Care Management services today including:  CCM service includes personalized support from designated clinical staff supervised by his physician, including individualized plan of care and coordination with other care providers 24/7 contact phone numbers for assistance for urgent and routine care needs. Service will only be billed when office clinical staff spend 20 minutes or more in a month to coordinate care. Only one practitioner may furnish and bill the service in a calendar month. The patient may stop CCM services at any time (effective at the end of the month) by phone call to the office staff.   Patient agreed to services and verbal consent obtained.   Follow up plan:   [name]

## 2021-09-03 ENCOUNTER — Other Ambulatory Visit: Payer: Self-pay | Admitting: Internal Medicine

## 2021-09-03 NOTE — Telephone Encounter (Signed)
Please refill as per office routine med refill policy (all routine meds to be refilled for 3 mo or monthly (per pt preference) up to one year from last visit, then month to month grace period for 3 mo, then further med refills will have to be denied) ? ?

## 2021-09-19 ENCOUNTER — Encounter: Payer: Self-pay | Admitting: Endocrinology

## 2021-09-19 ENCOUNTER — Ambulatory Visit: Payer: Medicare Other | Admitting: Endocrinology

## 2021-09-19 ENCOUNTER — Other Ambulatory Visit: Payer: Self-pay

## 2021-09-19 VITALS — BP 122/66 | HR 68 | Ht 68.0 in | Wt 250.0 lb

## 2021-09-19 DIAGNOSIS — E1151 Type 2 diabetes mellitus with diabetic peripheral angiopathy without gangrene: Secondary | ICD-10-CM | POA: Diagnosis not present

## 2021-09-19 DIAGNOSIS — Z794 Long term (current) use of insulin: Secondary | ICD-10-CM

## 2021-09-19 LAB — POCT GLYCOSYLATED HEMOGLOBIN (HGB A1C): Hemoglobin A1C: 8.3 % — AB (ref 4.0–5.6)

## 2021-09-19 MED ORDER — VICTOZA 18 MG/3ML ~~LOC~~ SOPN: 1.2000 mg | PEN_INJECTOR | Freq: Every day | SUBCUTANEOUS | 3 refills | Status: DC

## 2021-09-19 MED ORDER — TOUJEO MAX SOLOSTAR 300 UNIT/ML ~~LOC~~ SOPN: 240.0000 [IU] | PEN_INJECTOR | SUBCUTANEOUS | 3 refills | Status: DC

## 2021-09-19 NOTE — Patient Instructions (Signed)
I have sent 2 prescriptions to your pharmacy: to increase the Victoza, and to reduce the Toujeo.  check your blood sugar twice a day.  vary the time of day when you check, between before the 3 meals, and at bedtime.  also check if you have symptoms of your blood sugar being too high or too low.  please keep a record of the readings and bring it to your next appointment here (or you can bring the meter itself).  You can write it on any piece of paper.  please call us sooner if your blood sugar goes below 70, or if you have a lot of readings over 200.   Please come back for a follow-up appointment in 2 months.

## 2021-09-19 NOTE — Progress Notes (Signed)
Subjective:    Patient ID: [name], male    DOB: [birth year], 66 y.o.   MRN: [id number]  HPI Pt returns for f/u of diabetes mellitus:  DM type: Insulin-requiring type 2.   Dx'ed: 9292 Complications: PAD, CRI, and PN.    Therapy: insulin since 2009.   DKA: never Severe hypoglycemia: never.   Pancreatitis: never.  SDOH: he stopped Trulicity, due to cost Other: he takes a QD insulin regimen, after poor results with more frequent injections; he was changed to 70/30, due to pattern of cbg's, but he did poorly on this, too.   Interval history: Meter is downloaded today, and the printout is scanned into the record.  cbg's vary from 63-260.  It is in general lowest fasting, but there is little trend throughout the day.  pt states he feels well in general. He takes 260 units qam.  Past Medical History:  Diagnosis Date   Allergic rhinitis    At risk for sleep apnea    STOP-BANG= 5   SENT TO PCP 03-14-2014   CAD (coronary artery disease)    a. 07/2014 low risk MV; b. 07/2019 Cor CTA (FFR): LM 25-49 (nl), LAD mild prox/mid plaque (nl), D1 25-49p(nl w/ abnl FFR of 0.73 in inf branch), LCX/OM1 mild prox/mid plaque (nl), RCA nondominant, minimal Ca2+ plaque (nl), RPDA (mildly abnl @ 0.79)-->Med Rx..   Cataract    surgically removed bilateral   Condyloma acuminatum of penis    Diabetic neuropathy ([place])    Diastolic dysfunction    a. 05/2019 Echo: EF 60-65%, no rwam, mod LVH, impaired relaxation, nl RV size/fxn, trace MR, triv TR.   GERD (gastroesophageal reflux disease)    History of bladder cancer    s/p  turbt  2013/   transitional cell carcinoma--    History of condyloma acuminatum    PERINEAL AREA  W/ RECURRENCY   History of gout    Hyperlipidemia    Hypertension    Lower urinary tract symptoms (LUTS)    PAF (paroxysmal atrial fibrillation) ([place])    a. 06/2019 Event monitor: PAF <1% burden. Longest 3 mins 36 secs.   Productive cough    PSVT (paroxysmal supraventricular  tachycardia) ([place])    a. 06/2019 Event monitor: 112 episodes of SVT, longest 21 secs.   PVD (peripheral vascular disease) with claudication ([place])    a. 03/2014 LE art duplex: long segment occlusion of mid to distal R SFA; b. 03/2020 ABI: nl left and mildly improved R ABI->med rx.   Renal artery stenosis ([facility])    Renal artery stenosis ([place])    a. 12/2020 Renal art duplex: RRA 1-59%, LRA >60%.   Smokers' cough ([place])    Type 2 diabetes mellitus with insulin therapy ([place]) 1992   monitor by  dr ellsion   Wears dentures    upper    Past Surgical History:  Procedure Laterality Date   AXILLARY HIDRADENITIS EXCISION  1997   CARDIOVASCULAR STRESS TEST  07-24-2014  dr Kathlyn [place]   Low risk [place] nuclear study with apical thinning and small inferolateral wall infarct at mid & basal level , no ischemia/  normal LVF and wall motion , ef 59%   CATARACT EXTRACTION Left    CATARACT EXTRACTION W/ INTRAOCULAR LENS IMPLANT Right    CO2 LASER APPLICATION N/A 4/46/2863   Procedure: CO2 LASER APPLICATION,PENIS, V[place] Son, ANUS;  Surgeon: [name], MD;  Location: [facility];  Service: General;  Laterality: N/A;  CO2 LASER APPLICATION N/A 01/65/5374   Procedure: CO2 LASER APPLICATION;  Surgeon: Kathie Rhodes, MD;  Location: [facility];  Service: Urology;  Laterality: N/A;   CONDYLOMA EXCISION/FULGURATION N/A 05/21/2015   Procedure: CONDYLOMA REMOVAL;  Surgeon: Kathie Rhodes, MD;  Location: [facility];  Service: Urology;  Laterality: N/A;   HEMORRHOID SURGERY  10/24/2014   Procedure: HEMORRHOIDECTOMY;  Surgeon: Michael Boston, MD;  Location[facility]  Service: General;;   INCISION AND DRAINAGE ABSCESS Left 10/24/2014   Procedure: INCISION AND DRAINAGE ABSCESS;  Surgeon: Michael Boston, MD;  Location: [place];  Service: General;  Laterality: Left;   INGUINAL HIDRADENITIS [place] N/A  03/20/2014   Procedure: EXAM UNDER ANESTHESIA, REMOVAL/ABLATION OF CONDYLOMATA PENIS,GROINS, ANUS, ANAL CANAL;  Surgeon: [name], MD;  Location: [facility];  Service: General;  Laterality: N/A;  groin and anus   LASER ABLATION CONDOLAMATA N/A 10/24/2014   Procedure: LASER ABLATION CONDOLAMATA;  Surgeon: Michael Boston, MD;  Location: [place];  Service: General;  Laterality: N/A;   LASER ABLATION OF PENILE AND PERIANAL WARTS  07-29-2007  Dr. Johney [place]   LEFT SHOULDER SURGERY  2003   MASS EXCISION N/A 10/24/2014   Procedure: EXCISION OF PERINEAL MASS/SINUS;  Surgeon: Michael Boston, MD;  Location: [place];  Service: General;  Laterality: N/A;   MOHS SURGERY     back   MOHS SURGERY  2017   face   MULTIPLE TOOTH EXTRACTIONS     PERINEAL HIDRADENITIS [place] TUMOR  05/21/2012   Procedure: TRANSURETHRAL RESECTION OF BLADDER TUMOR (TURBT);  Surgeon: Claybon Jabs, MD;  Location: [facility];  Service: Urology;  Laterality: N/A;       Social History   Socioeconomic History   Marital status: Married    Spouse name: Not on file   Number of children: Not on file   Years of education: Not on file   Highest education level: Not on file  Occupational History   Occupation: Disabled  Tobacco Use   Smoking status: Former    Packs/day: 1.00    Years: 41.00    Pack years: 41.00    Types: Cigars, Cigarettes    Quit date: 04/11/2016    Years since quitting: 5.4   Smokeless tobacco: Never  Vaping Use   Vaping Use: Former  Substance and Sexual Activity   Alcohol use: No    Alcohol/week: 0.0 standard drinks   Drug use: No   Sexual activity: Not on file  Other Topics Concern   Not on file  Social History Narrative   Not on file   Social Determinants of Health   Financial Resource Strain: Low Risk    Difficulty of Paying Living Expenses: Not hard at all  Food Insecurity: No  Food Insecurity   Worried About Charity fundraiser in the Last Year: Never true   [place] in the Last Year: Never true  Transportation Needs: No Transportation Needs   Lack of Transportation (Medical): No   Lack of Transportation (Non-Medical): No  Physical Activity: Inactive   Days of Exercise per Week: 0 days   Minutes of Exercise per Session: 0 min  Stress: No Stress Concern Present   Feeling of Stress : Not at all  Social Connections: Moderately Integrated   Frequency of Communication with Friends and  Family: More than three times a week   Frequency of Social Gatherings with Friends and Family: Never   Attends Religious Services: Never   Marine scientist or Organizations: Yes   Attends Archivist Meetings: Never   Marital Status: Married  Human resources officer Violence: Not At Risk   Fear of Current or Ex-Partner: No   Emotionally Abused: No   Physically Abused: No   Sexually Abused: No    Current Outpatient Medications on File Prior to Visit  Medication Sig Dispense Refill   aspirin EC 81 MG tablet Take 1 tablet (81 mg total) by mouth daily.     atorvastatin (LIPITOR) 40 MG tablet Take 1 tablet by mouth once daily 90 tablet 2   Blood Glucose Monitoring Suppl (ONE TOUCH ULTRA 2) w/Device KIT Use as directed to check blood sugars twice a day 1 kit 0   chlorthalidone (HYGROTON) 25 MG tablet TAKE 1 TABLET BY MOUTH  DAILY 90 tablet 0   citalopram (CELEXA) 20 MG tablet Take 1 tablet (20 mg total) by mouth daily. 90 tablet 3   diltiazem (CARDIZEM CD) 360 MG 24 hr capsule Take 1 capsule by mouth once daily 30 capsule 0   irbesartan (AVAPRO) 300 MG tablet TAKE 1 TABLET BY MOUTH  DAILY 90 tablet 3   Lancets (ONETOUCH ULTRASOFT) lancets 1 each by Other route 2 (two) times daily. Dx E11.9 and Z79.4 180 each 3   loratadine (CLARITIN) 10 MG tablet Take 10 mg by mouth daily as needed.      methocarbamol (ROBAXIN) 500 MG tablet Take 1 tablet (500 mg total) by mouth every  6 (six) hours as needed for muscle spasms. 60 tablet 0   ONETOUCH ULTRA test strip CHECK BLOOD SUGAR TWICE  DAILY 200 strip 3   pantoprazole (PROTONIX) 40 MG tablet TAKE 1 TABLET BY MOUTH  DAILY 90 tablet 3   spironolactone (ALDACTONE) 25 MG tablet Take 1 tablet (25 mg total) by mouth daily. 90 tablet 3   No current facility-administered medications on file prior to visit.    Allergies  Allergen Reactions   Cefepime Hives    Had allergic reaction to one of these 3 agents, unclear which one   Metoprolol Hives    Had allergic reaction to one of these 3 agents, unclear which one  *Per RN, highly likely this is the cause of allergic rxn   Myrbetriq [Mirabegron] Hives    Had allergic reaction to one of these 3 agents, unclear which one    Family History  Problem Relation Age of Onset   Hypertension Mother    Cancer Father        lung ca   Diabetes Maternal Aunt        x 2   Colon cancer Neg Hx    Esophageal cancer Neg Hx    Pancreatic cancer Neg Hx    Prostate cancer Neg Hx    Kidney disease Neg Hx    Liver disease Neg Hx    Lung cancer Neg Hx    Rectal cancer Neg Hx    Stomach cancer Neg Hx     BP 122/66    Pulse 68    Ht 5' 8"  (1.727 m)    Wt 250 lb (113.4 kg)    SpO2 95%    BMI 38.01 kg/m    Review of Systems Denies N/V/HB    Objective:   Physical Exam    Lab Results  Component Value Date  HGBA1C 8.3 (A) 09/19/2021      Assessment & Plan:  Insulin-requiring type 2 DM: uncontrolled.    Patient Instructions  I have sent 2 prescriptions to your pharmacy: to increase the Victoza, and to reduce the Toujeo.  check your blood sugar twice a day.  vary the time of day when you check, between before the 3 meals, and at bedtime.  also check if you have symptoms of your blood sugar being too high or too low.  please keep a record of the readings and bring it to your next appointment here (or you can bring the meter itself).  You can write it on any piece of paper.   please call us sooner if your blood sugar goes below 70, or if you have a lot of readings over 200.   Please come back for a follow-up appointment in 2 months.

## 2021-09-26 ENCOUNTER — Encounter: Payer: Medicare Other | Admitting: Psychologist

## 2021-09-26 ENCOUNTER — Other Ambulatory Visit: Payer: Self-pay

## 2021-09-26 NOTE — Progress Notes (Signed)
° ° ° ° ° ° ° ° ° ° ° ° ° ° °  Conception Chancy, PsyD This encounter was created in error - please disregard. This encounter was created in error - please disregard.

## 2021-09-29 ENCOUNTER — Other Ambulatory Visit: Payer: Self-pay | Admitting: Internal Medicine

## 2021-09-29 NOTE — Telephone Encounter (Signed)
Please refill as per office routine med refill policy (all routine meds to be refilled for 3 mo or monthly (per pt preference) up to one year from last visit, then month to month grace period for 3 mo, then further med refills will have to be denied) ? ?

## 2021-10-29 ENCOUNTER — Other Ambulatory Visit: Payer: Self-pay | Admitting: Internal Medicine

## 2021-10-29 ENCOUNTER — Other Ambulatory Visit: Payer: Self-pay

## 2021-10-29 ENCOUNTER — Ambulatory Visit (INDEPENDENT_AMBULATORY_CARE_PROVIDER_SITE_OTHER): Payer: Medicare Other

## 2021-10-29 DIAGNOSIS — E1151 Type 2 diabetes mellitus with diabetic peripheral angiopathy without gangrene: Secondary | ICD-10-CM

## 2021-10-29 DIAGNOSIS — E7800 Pure hypercholesterolemia, unspecified: Secondary | ICD-10-CM

## 2021-10-29 DIAGNOSIS — I10 Essential (primary) hypertension: Secondary | ICD-10-CM

## 2021-10-29 NOTE — Progress Notes (Signed)
? ?Chronic Care Management ?Pharmacy Note ? ?10/29/2021 ?Name:  [name] MRN:  [id number] DOB:  11-04-1955 ? ?Summary: ?-Patient a somewhat difficult historian with his medications, appears to be complaint with current medications - notes that his toujeo and trulicity can be quite expensive  ?-Patient notes that since starting victoza has lost about 20lbs  - previously had been rx'd trulicity - had never started due to cost  ?-A1c has improved to 8.3% - working with Dr. Loanne Drilling - notes that his BG in AM can average around 70-90, in the PM averaging 130-180 -if he has a low blood sugar - corrects by eating a sweet / drinking juice  ?-Checking BP at home on occasion - averaging 110-120/70's  ? ?Recommendations/Changes made from today's visit: ?-Will start patient assistance applications for toujeo and trulicity  - will reach out to Dr. Loanne Drilling about switching from [place] to trulicity if approved  ?-Patient to continue current medications - continue checking BG twice daily and BP at least 3 times weekly, to reach out to office should either become uncontrolled  ? ?Plan: ?-Telephone f/u in 3 months  ? ?Subjective: ?[name] is an 66 y.o. year old male who is a primary patient of Jenny Reichmann, Hunt Oris, MD.  The CCM team was consulted for assistance with disease management and care coordination needs.   ? ?Engaged with patient face to face for initial visit in response to provider referral for pharmacy case management and/or care coordination services.  ? ?Consent to Services:  ?The patient was given the following information about Chronic Care Management services today, agreed to services, and gave verbal consent: 1. CCM service includes personalized support from designated clinical staff supervised by the primary care provider, including individualized plan of care and coordination with other care providers 2. 24/7 contact phone numbers for assistance for urgent and routine care needs. 3. Service will only be billed  when office clinical staff spend 20 minutes or more in a month to coordinate care. 4. Only one practitioner may furnish and bill the service in a calendar month. 5.The patient may stop CCM services at any time (effective at the end of the month) by phone call to the office staff. 6. The patient will be responsible for cost sharing (co-pay) of up to 20% of the service fee (after annual deductible is met). Patient agreed to services and consent obtained. ? ?Patient Care Team: ?Biagio Borg, MD as PCP - General (Internal Medicine) ?Wellington Hampshire, MD as PCP - Cardiology (Cardiology) ?Kathie Rhodes, MD (Inactive) as Consulting Physician (Urology) ?Wellington Hampshire, MD as Consulting Physician (Cardiology) ?Michael Boston, MD as Consulting Physician (General Surgery) ?Snipes, Ryan E, OD (Optometry) ?[name], MD as Consulting Physician (Endocrinology) ?Tomasa Blase, [facility] as Pharmacist (Pharmacist) ? ?Recent office visits: ?08/08/2021 - Dr. Jenny Reichmann - increase citalopram to 69m daily  ? ?Recent consult visits: ?09/19/2021 - Dr. ELoanne Drilling- Endocrinology - toujeo decreased to 240 units daily, victoza increased to 1.238mdaily  ?08/21/2021 - ChMurray HodgkinsP - Cardiology -  no changes to medications - f/u in 6 months  ?07/18/2021 - Dr. ElLoanne Drilling Endocrinology - decrease toujeo to 270 units daily, start victoza 0.75m50maily  ? ?Hospital visits: ?None in previous 6 months ? ?Objective: ? ?Lab Results  ?Component Value Date  ? CREATININE 1.27 (H) 08/21/2021  ? BUN 21 08/21/2021  ? GFR 48.88 (L) 05/01/2021  ? GFRNONAA >60 08/21/2021  ? GFRAA >60 02/29/2020  ? NA 133 (  L) 08/21/2021  ? K 3.9 08/21/2021  ? CALCIUM 9.6 08/21/2021  ? CO2 25 08/21/2021  ? GLUCOSE 81 08/21/2021  ? ? ?Lab Results  ?Component Value Date/Time  ? HGBA1C 8.3 (A) 09/19/2021 02:58 PM  ? HGBA1C 9.1 (A) 07/18/2021 08:42 AM  ? HGBA1C 9.4 (H) 05/01/2021 03:25 PM  ? HGBA1C 8.9 (H) 10/20/2019 02:42 PM  ? FRUCTOSAMINE 313 (H) 07/14/2019 03:07 PM  ? GFR 48.88  (L) 05/01/2021 03:25 PM  ? GFR 64.11 10/25/2020 02:17 PM  ? MICROALBUR 18.0 (H) 10/25/2020 02:17 PM  ? MICROALBUR 24.0 (H) 10/20/2019 02:42 PM  ?  ?Last diabetic Eye exam:  ?Lab Results  ?Component Value Date/Time  ? HMDIABEYEEXA Retinopathy (A) 11/13/2020 03:18 PM  ?  ?Last diabetic Foot exam:  ?No results found for: HMDIABFOOTEX  ? ?Lab Results  ?Component Value Date  ? CHOL 102 05/01/2021  ? HDL 27.80 (L) 05/01/2021  ? [place] 35 10/20/2019  ? LDLDIRECT 54.0 05/01/2021  ? TRIG 264.0 (H) 05/01/2021  ? CHOLHDL 4 05/01/2021  ? ? ?Hepatic Function Latest Ref Rng & Units 05/01/2021 10/25/2020 10/20/2019  ?Total Protein 6.0 - 8.3 g/dL 7.6 7.7 7.0  ?Albumin 3.5 - 5.2 g/dL 4.0 4.3 4.0  ?AST 0 - 37 U/L 29 36 28  ?ALT 0 - 53 U/L 34 51 37  ?Alk Phosphatase 39 - 117 U/L 83 73 77  ?Total Bilirubin 0.2 - 1.2 mg/dL 0.7 0.7 0.7  ?Bilirubin, Direct 0.0 - 0.3 mg/dL 0.2 0.1 0.1  ? ? ?Lab Results  ?Component Value Date/Time  ? TSH 3.15 10/25/2020 02:17 PM  ? TSH 1.54 10/20/2019 02:42 PM  ? ? ?CBC Latest Ref Rng & Units 10/25/2020 10/20/2019 06/09/2019  ?WBC 4.0 - 10.5 K/uL 12.9(H) 11.7(H) 12.5(H)  ?Hemoglobin 13.0 - 17.0 g/dL 15.3 14.3 13.4  ?Hematocrit 39.0 - 52.0 % 43.8 41.5 39.1  ?Platelets 150.0 - 400.0 K/uL 295.0 260.0 259  ? ? ?Lab Results  ?Component Value Date/Time  ? VD25OH 39.05 05/01/2021 03:25 PM  ? VD25OH 35.06 10/25/2020 02:17 PM  ? ? ?Clinical ASCVD: No  ?The ASCVD Risk score (Arnett DK, et al., 2019) failed to calculate for the following reasons: ?  The valid total cholesterol range is 130 to 320 mg/dL   ? ?Depression screen [facility] 2/9 08/23/2021 05/01/2021 10/25/2020  ?Decreased Interest 0 0 0  ?Down, Depressed, Hopeless 0 0 0  ?PHQ - 2 Score 0 0 0  ?Some recent data might be hidden  ? ? ?Social History  ? ?Tobacco Use  ?Smoking Status Former  ? Packs/day: 1.00  ? Years: 41.00  ? Pack years: 41.00  ? Types: Cigars, Cigarettes  ? Quit date: 04/11/2016  ? Years since quitting: 5.5  ?Smokeless Tobacco Never  ? ?BP Readings from  Last 3 Encounters:  ?09/19/21 122/66  ?08/23/21 120/60  ?08/21/21 139/74  ? ?Pulse Readings from Last 3 Encounters:  ?09/19/21 68  ?08/23/21 62  ?08/21/21 68  ? ?Wt Readings from Last 3 Encounters:  ?09/19/21 250 lb (113.4 kg)  ?08/23/21 247 lb (112 kg)  ?08/21/21 247 lb (112 kg)  ? ?BMI Readings from Last 3 Encounters:  ?09/19/21 38.01 kg/m?  ?08/23/21 37.56 kg/m?  ?08/21/21 37.56 kg/m?  ? ? ?Assessment/Interventions: Review of patient past medical history, allergies, medications, health status, including review of consultants reports, laboratory and other test data, was performed as part of comprehensive evaluation and provision of chronic care management services.  ? ?SDOH:  (Social Determinants of Health) assessments and  interventions performed: Yes ? ?SDOH Screenings  ? ?Alcohol Screen: Low Risk   ? Last Alcohol Screening Score (AUDIT): 0  ?Depression (PHQ2-9): Low Risk   ? PHQ-2 Score: 0  ?Financial Resource Strain: Low Risk   ? Difficulty of Paying Living Expenses: Not hard at all  ?Food Insecurity: No Food Insecurity  ? Worried About Charity fundraiser in the Last Year: Never true  ? Ran Out of Food in the Last Year: Never true  ?Housing: Low Risk   ? Last Housing Risk Score: 0  ?Physical Activity: Inactive  ? Days of Exercise per Week: 0 days  ? Minutes of Exercise per Session: 0 min  ?Social Connections: Moderately Integrated  ? Frequency of Communication with Friends and Family: More than three times a week  ? Frequency of Social Gatherings with Friends and Family: Never  ? Attends Religious Services: Never  ? Active Member of Clubs or Organizations: Yes  ? Attends Archivist Meetings: Never  ? Marital Status: Married  ?Stress: No Stress Concern Present  ? Feeling of Stress : Not at all  ?Tobacco Use: Medium Risk  ? Smoking Tobacco Use: Former  ? Smokeless Tobacco Use: Never  ? Passive Exposure: Not on file  ?Transportation Needs: No Transportation Needs  ? Lack of Transportation (Medical): No   ? Lack of Transportation (Non-Medical): No  ? ? ?CCM Care Plan ? ?Allergies  ?Allergen Reactions  ? Cefepime Hives  ?  Had allergic reaction to one of these 3 agents, unclear which one  ? Metoprolol Hives

## 2021-10-29 NOTE — Telephone Encounter (Signed)
Please refill as per office routine med refill policy (all routine meds to be refilled for 3 mo or monthly (per pt preference) up to one year from last visit, then month to month grace period for 3 mo, then further med refills will have to be denied) ? ?

## 2021-10-29 NOTE — Patient Instructions (Signed)
Visit Information  ? ?Following is a copy of your full care plan:  ?Care Plan : [name]  ?Updates made by Tomasa Blase, RPH since 10/29/2021 12:00 AM  ?  ? ?Problem: HTN, DM, HLD   ?Priority: High  ?Onset Date: 10/29/2021  ?  ? ?Long-Range Goal: Disease Management   ?Start Date: 10/29/2021  ?Expected End Date: 10/30/2022  ?This Visit's Progress: On track  ?Priority: High  ?Note:   ?Current Barriers:  ?Unable to independently afford treatment regimen ? ?Pharmacist Clinical Goal(s):  ?Patient will achieve adherence to monitoring guidelines and medication adherence to achieve therapeutic efficacy through collaboration with PharmD and provider.  ? ?Interventions: ?1:1 collaboration with Biagio Borg, MD regarding development and update of comprehensive plan of care as evidenced by provider attestation and co-signature ?Inter-disciplinary care team collaboration (see longitudinal plan of care) ?Comprehensive medication review performed; medication list updated in electronic medical record ? ?Hypertension (BP goal <130/80) ?-Controlled ?-Current treatment: ?Diltiazem 353m daily  ?Spironolactone 263mdaily ?Chlothalidone 2536maily ?Irbesartan 300m19mily  ?-Medications previously tried: amlodipine, hctz, lisinopril, losartan,   ?-Current home readings: ~110-120/60-70's, 140/70 was the highest he could recall  ?BP Readings from Last 3 Encounters:  ?09/19/21 122/66  ?08/23/21 120/60  ?08/21/21 139/74  ?-Current dietary habits: reports that he does not use salt in his cooking  ?-Current exercise habits: nothing scheduled at this time  - cuts fire food at times - walks on his property  ?-Denies hypotensive/hypertensive symptoms ?-Educated on BP goals and benefits of medications for prevention of heart attack, stroke and kidney damage; ?Daily salt intake goal < 2300 mg; ?Exercise goal of 150 minutes per week; ?Importance of home blood pressure monitoring; ?Proper BP monitoring technique; ?Symptoms of  hypotension and importance of maintaining adequate hydration; ?-Counseled to monitor BP at home 3 times weekly, document, and provide log at future appointments ?-Counseled on diet and exercise extensively ?Recommended to continue current medication ? ?Hyperlipidemia: (LDL goal < 70) ?-Controlled ?Lab Results  ?Component Value Date  ? LDL[place]03/06/2020  ?-Current treatment: ?Atorvastatin 40mg47mly  ?ASA 81mg 86my  ?-Medications previously tried: pravastatin   ?-Current dietary patterns: reports to trying to eat a low cholesterol diet  ?-Current exercise habits: nothing scheduled at this time  ?-Educated on Cholesterol goals;  ?Benefits of statin for ASCVD risk reduction; ?Importance of limiting foods high in cholesterol; ?Exercise goal of 150 minutes per week; ?Strategies to manage statin-induced myalgias; ?-Counseled on diet and exercise extensively ?Recommended to continue current medication ? ?Diabetes (A1c goal <7%) ?-Not ideally controlled - has been improving, notes that since starting victoza -A1c improved to 8.3% - insulin dosage requirements have decreased - reports to about 20 lbs of weight loss  ?Lab Results  ?Component Value Date  ? HGBA1C 8.3 (A) 09/19/2021  ?-Current medications: ?Toujeo (300 unit/mL) - 240 units daily (120 unit injection x 2)  ?Victoza 1.2mg da71m  ?-Medications previously tried: lantus, trulicity (never started due to cost), humalog mix, humulin n, novolin n, novolin 70/30, metformin,   ?-Current home glucose readings ?fasting glucose: 70-90 per patient  ?post prandial glucose: 130-180 ?-Reports hypoglycemic/hyperglycemic symptoms ?-Current meal patterns:  ?breakfast: cereal / eggs and hashbrowns ?lunch: sandwich, potato chips   ?dinner: corned beef, potatoes, carrots, cabbage, chicken, rice, beans ?snacks: oatmeal cookie / oreo on occasion  ?drinks: diet Dr. Pepper Malachi Bondsent exercise: nothing scheduled at this time  ?-Educated on A1c and blood sugar goals; ?Complications of  diabetes including kidney damage,  retinal damage, and cardiovascular disease; ?Exercise goal of 150 minutes per week; ?Benefits of weight loss; ?Proper insulin injection technique; ?Prevention and management of hypoglycemic episodes; ?Benefits of routine self-monitoring of blood sugar; ?Carbohydrate counting and/or plate method ?-Counseled to check feet daily and get yearly eye exams ?-started patient assistance applications for trulicity and toujeo - believe trulicity will be easier for patient as it is a once weekly injection, and will offer greater BG lower benefits - patient to continue current medications for now, has follow up with Dr. Loanne Drilling in 1 month  ? ?Health Maintenance ?-Vaccine gaps: shingrix, pneumonia, COVID booster ?-Current therapy:  ?Loratadine 63m daily  ?-Educated on Cost vs benefit of each product must be carefully weighed by individual consumer ?-Patient is satisfied with current therapy and denies issues ?-Recommended to continue current medication ? ?Patient Goals/Self-Care Activities ?Patient will:  ?- check glucose twice daily , document, and provide at future appointments ?check blood pressure 3 times weekly, document, and provide at future appointments ?collaborate with provider on medication access solutions ?target a minimum of 150 minutes of moderate intensity exercise weekly ?engage in dietary modifications by reducing carbohydrates, increasing lean protein and vegetable  ? ?Follow Up Plan: Telephone follow up appointment with care management team member scheduled for: 3 months  ?The patient has been provided with contact information for the care management team and has been advised to call with any health related questions or concerns.  ? ?  ? ? ?Consent to CCM Services: ?Mr. WMccortwas given information about Chronic Care Management services including:  ?CCM service includes personalized support from designated clinical staff supervised by his physician, including individualized  plan of care and coordination with other care providers ?24/7 contact phone numbers for assistance for urgent and routine care needs. ?Service will only be billed when office clinical staff spend 20 minutes or more in a month to coordinate care. ?Only one practitioner may furnish and bill the service in a calendar month. ?The patient may stop CCM services at any time (effective at the end of the month) by phone call to the office staff. ?The patient will be responsible for cost sharing (co-pay) of up to 20% of the service fee (after annual deductible is met). ? ?Patient agreed to services and verbal consent obtained.  ? ?Plan: Telephone follow up appointment with care management team member scheduled for:  3 months ?The patient has been provided with contact information for the care management team and has been advised to call with any health related questions or concerns.  ? ?DTomasa Blase PharmD ?Clinical Pharmacist, [facility] ? ?Please call the care guide team at [phone] you need to cancel or reschedule your appointment.  ? ?The patient verbalized understanding of instructions, educational materials, and care plan provided today and declined offer to receive copy of patient instructions, educational materials, and care plan.  ? ? ?

## 2021-11-06 DIAGNOSIS — H02831 Dermatochalasis of right upper eyelid: Secondary | ICD-10-CM | POA: Diagnosis not present

## 2021-11-06 DIAGNOSIS — H02834 Dermatochalasis of left upper eyelid: Secondary | ICD-10-CM | POA: Diagnosis not present

## 2021-11-06 DIAGNOSIS — H524 Presbyopia: Secondary | ICD-10-CM | POA: Diagnosis not present

## 2021-11-06 DIAGNOSIS — H0015 Chalazion left lower eyelid: Secondary | ICD-10-CM | POA: Diagnosis not present

## 2021-11-06 DIAGNOSIS — E119 Type 2 diabetes mellitus without complications: Secondary | ICD-10-CM | POA: Diagnosis not present

## 2021-11-06 LAB — HM DIABETES EYE EXAM

## 2021-11-08 DIAGNOSIS — E1151 Type 2 diabetes mellitus with diabetic peripheral angiopathy without gangrene: Secondary | ICD-10-CM | POA: Diagnosis not present

## 2021-11-08 DIAGNOSIS — I10 Essential (primary) hypertension: Secondary | ICD-10-CM

## 2021-11-08 DIAGNOSIS — Z794 Long term (current) use of insulin: Secondary | ICD-10-CM

## 2021-11-08 DIAGNOSIS — E7800 Pure hypercholesterolemia, unspecified: Secondary | ICD-10-CM

## 2021-11-18 ENCOUNTER — Other Ambulatory Visit: Payer: Self-pay | Admitting: Cardiovascular Disease

## 2021-11-18 ENCOUNTER — Other Ambulatory Visit: Payer: Self-pay | Admitting: Internal Medicine

## 2021-11-18 DIAGNOSIS — I10 Essential (primary) hypertension: Secondary | ICD-10-CM

## 2021-11-19 ENCOUNTER — Telehealth: Payer: Self-pay

## 2021-11-19 NOTE — Progress Notes (Signed)
Contacted  [employer]   to follow up on patient assistance application for Ozempic. Per representative at  [employer]  states they are missing patient portion of the application.Rep stated that they need proof of income, pg 3 signed not printed and W2 form part of it got cut off.   ? ?Contacted [employer]  to follow up on patient assistance application for [name]. Per representative at [employer] application is processing.   ? ?[name] ?Clinical Pharmacist Assistant ?[phone]  ?

## 2021-11-19 NOTE — Telephone Encounter (Signed)
Refaxed updated [employer] application per request  ?

## 2021-11-22 ENCOUNTER — Other Ambulatory Visit: Payer: Self-pay | Admitting: Internal Medicine

## 2021-11-22 NOTE — Telephone Encounter (Signed)
Please refill as per office routine med refill policy (all routine meds to be refilled for 3 mo or monthly (per pt preference) up to one year from last visit, then month to month grace period for 3 mo, then further med refills will have to be denied) ? ?

## 2021-11-25 NOTE — Progress Notes (Signed)
? ?  Contacted [employer]  to follow up on patient assistance application for [name]. Per representative at [employer] states patient has been approved starting 11/22/21 through 08/10/22. Shippment went out on 11/22/21 allow 5 to 7 business days from the 4/14 for delivery.   ? ?[employer]  to follow up on patient assistance application for Ozempic. Per representative at [employer] patient has been approved starting 11/24/21 through 08/10/22. Shippment went out on 11/24/21 allow 10 to 14 business days from 4/16 for delivery to the health care providers office. There is also a 6 week shipping delay. ?   ?[name] ?Clinical Pharmacist Assistant ?[phone]  ?

## 2021-11-28 ENCOUNTER — Encounter: Payer: Self-pay | Admitting: Endocrinology

## 2021-11-28 ENCOUNTER — Ambulatory Visit: Payer: Medicare Other | Admitting: Endocrinology

## 2021-11-28 VITALS — BP 150/70 | HR 76 | Ht 68.0 in | Wt 246.8 lb

## 2021-11-28 DIAGNOSIS — E1151 Type 2 diabetes mellitus with diabetic peripheral angiopathy without gangrene: Secondary | ICD-10-CM | POA: Diagnosis not present

## 2021-11-28 DIAGNOSIS — Z794 Long term (current) use of insulin: Secondary | ICD-10-CM | POA: Diagnosis not present

## 2021-11-28 LAB — POCT GLYCOSYLATED HEMOGLOBIN (HGB A1C): Hemoglobin A1C: 8.7 % — AB (ref 4.0–5.6)

## 2021-11-28 MED ORDER — VICTOZA 18 MG/3ML ~~LOC~~ SOPN: 1.8000 mg | PEN_INJECTOR | Freq: Every day | SUBCUTANEOUS | 3 refills | Status: DC

## 2021-11-28 NOTE — Patient Instructions (Addendum)
I have sent 2 prescriptions to your pharmacy: to increase the Victoza. ?Please continue the same Toujeo.  ?check your blood sugar twice a day.  vary the time of day when you check, between before the 3 meals, and at bedtime.  also check if you have symptoms of your blood sugar being too high or too low.  please keep a record of the readings and bring it to your next appointment here (or you can bring the meter itself).  You can write it on any piece of paper.  please call us sooner if your blood sugar goes below 70, or if you have a lot of readings over 200.   ?You should have an endocrinology follow-up appointment in 2 months.   ?

## 2021-11-28 NOTE — Progress Notes (Signed)
? ?Subjective:  ? ? Patient ID: [name], male    DOB: 03-14-1956, 66 y.o.   MRN: [id number] ? ?HPI ?Pt returns for f/u of diabetes mellitus:  ?DM type: Insulin-requiring type 2.   ?Dx'ed: 1992 ?Complications: PAD, CRI, CAD, and PN.    ?Therapy: insulin since 2009, and Victoza.   ?DKA: never ?Severe hypoglycemia: never.   ?Pancreatitis: never.  ?SDOH: he stopped Trulicity, due to cost ?Other: he takes a QD insulin regimen, after poor results with more frequent injections; he was changed to 70/30, due to pattern of cbg's, but he did poorly on this, too.   ?Interval history: no cbg record, but states cbg's vary from 70-239.  It is in general lowest fasting.  pt states he feels well in general. He takes meds as rx'ed.    ?Past Medical History:  ?Diagnosis Date  ? Allergic rhinitis   ? At risk for sleep apnea   ? STOP-BANG= 5   SENT TO PCP 03-14-2014  ? CAD (coronary artery disease)   ? a. 07/2014 low risk MV; b. 07/2019 Cor CTA (FFR): LM 25-49 (nl), LAD mild prox/mid plaque (nl), D1 25-49p(nl w/ abnl FFR of 0.73 in inf branch), LCX/OM1 mild prox/mid plaque (nl), RCA nondominant, minimal Ca2+ plaque (nl), RPDA (mildly abnl @ 0.79)-->Med Rx..  ? Cataract   ? surgically removed bilateral  ? Condyloma acuminatum of penis   ? Diabetic neuropathy ([place])   ? Diastolic dysfunction   ? a. 05/2019 Echo: EF 60-65%, no rwam, mod LVH, impaired relaxation, nl RV size/fxn, trace MR, triv TR.  ? GERD (gastroesophageal reflux disease)   ? History of bladder cancer   ? s/p  turbt  2013/   transitional cell carcinoma--   ? History of condyloma acuminatum   ? PERINEAL AREA  W/ RECURRENCY  ? History of gout   ? Hyperlipidemia   ? Hypertension   ? Lower urinary tract symptoms (LUTS)   ? PAF (paroxysmal atrial fibrillation) ([place])   ? a. 06/2019 Event monitor: PAF <1% burden. Longest 3 mins 36 secs.  ? Productive cough   ? PSVT (paroxysmal supraventricular tachycardia) ([place])   ? a. 06/2019 Event monitor: 112 episodes of SVT, longest 21  secs.  ? PVD (peripheral vascular disease) with claudication ([place])   ? a. 03/2014 LE art duplex: long segment occlusion of mid to distal R SFA; b. 03/2020 ABI: nl left and mildly improved R ABI->med rx.  ? Renal artery stenosis ([place])   ? Renal artery stenosis ([place])   ? a. 12/2020 Renal art duplex: RRA 1-59%, LRA >60%.  ? Smokers' cough ([place])   ? Type 2 diabetes mellitus with insulin therapy ([place]) 1992  ? monitor by  dr ellsion  ? Wears dentures   ? upper  ? ? ?Past Surgical History:  ?Procedure Laterality Date  ? AXILLARY HIDRADENITIS EXCISION  1997  ? CARDIOVASCULAR STRESS TEST  07-24-2014  dr Kathlyn [place]  ? Low risk lexiscan nuclear study with apical thinning and small inferolateral wall infarct at mid & basal level , no ischemia/  normal LVF and wall motion , ef 59%  ? CATARACT EXTRACTION Left   ? CATARACT EXTRACTION W/ INTRAOCULAR LENS IMPLANT Right   ? CO2 LASER APPLICATION N/A 9/32/3557  ? Procedure: CO2 LASER APPLICATION,PENIS, GROIN, ANUS;  Surgeon: [name], MD;  Location: [facility];  Service: General;  Laterality: N/A;  ? CO2 LASER APPLICATION N/A 32/20/2542  ? Procedure: CO2 LASER APPLICATION;  Surgeon: Kathie Rhodes, MD;  Location: [facility];  Service: Urology;  Laterality: N/A;  ? CONDYLOMA EXCISION/FULGURATION N/A 05/21/2015  ? Procedure: CONDYLOMA REMOVAL;  Surgeon: Kathie Rhodes, MD;  Location: [facility];  Service: Urology;  Laterality: N/A;  ? HEMORRHOID SURGERY  10/24/2014  ? Procedure: HEMORRHOIDECTOMY;  Surgeon: Michael Boston, MD;  Location: [facility];  Service: General;;  ? INCISION AND DRAINAGE ABSCESS Left 10/24/2014  ? Procedure: INCISION AND DRAINAGE ABSCESS;  Surgeon: Michael Boston, MD;  Location: [facility];  Service: General;  Laterality: Left;  ? [place]  ? LASER ABLATION CONDOLAMATA N/A 03/20/2014  ? Procedure: EXAM UNDER ANESTHESIA, REMOVAL/ABLATION OF CONDYLOMATA  PENIS,GROINS, ANUS, ANAL CANAL;  Surgeon: [name], MD;  Location: [facility];  Service: General;  Laterality: N/A;  groin and anus  ? LASER ABLATION CONDOLAMATA N/A 10/24/2014  ? Procedure: LASER ABLATION CONDOLAMATA;  Surgeon: Michael Boston, MD;  Location: [facility];  Service: General;  Laterality: N/A;  ? LASER ABLATION OF PENILE AND PERIANAL WARTS  07-29-2007  Dr. Johney Maine  ? LEFT SHOULDER SURGERY  2003  ? MASS EXCISION N/A 10/24/2014  ? Procedure: EXCISION OF PERINEAL MASS/SINUS;  Surgeon: Michael Boston, MD;  Location[facility];  Service: General;  Laterality: N/A;  ? MOHS SURGERY    ? back  ? MOHS SURGERY  2017  ? face  ? MULTIPLE TOOTH EXTRACTIONS    ? PERINEAL HIDRADENITIS EXCISION  1998, 1999  ? TRANSURETHRAL RESECTION OF BLADDER TUMOR  05/21/2012  ? Procedure: TRANSURETHRAL RESECTION OF BLADDER TUMOR (TURBT);  Surgeon: Claybon Jabs, MD;  Location: [facility];  Service: Urology;  Laterality: N/A;   ?  ? ? ?Social History  ? ?Socioeconomic History  ? Marital status: Married  ?  Spouse name: Not on file  ? Number of children: Not on file  ? Years of education: Not on file  ? Highest education level: Not on file  ?Occupational History  ? Occupation: Disabled  ?Tobacco Use  ? Smoking status: Former  ?  Packs/day: 1.00  ?  Years: 41.00  ?  Pack years: 41.00  ?  Types: Cigars, Cigarettes  ?  Quit date: 04/11/2016  ?  Years since quitting: 5.6  ? Smokeless tobacco: Never  ?Vaping Use  ? Vaping Use: Former  ?Substance and Sexual Activity  ? Alcohol use: No  ?  Alcohol/week: 0.0 standard drinks  ? Drug use: No  ? Sexual activity: Not on file  ?Other Topics Concern  ? Not on file  ?Social History Narrative  ? Not on file  ? ?Social Determinants of Health  ? ?Financial Resource Strain: Low Risk   ? Difficulty of Paying Living Expenses: Not hard at all  ?Food Insecurity: No Food Insecurity  ? Worried About Charity fundraiser in the Last Year: Never  true  ? Ran Out of Food in the Last Year: Never true  ?Transportation Needs: No Transportation Needs  ? Lack of Transportation (Medical): No  ? Lack of Transportation (Non-Medical): No  ?Physical Activity: Inactive  ? Days of Exercise per Week: 0 days  ? Minutes of Exercise per Session: 0 min  ?Stress: No Stress Concern Present  ? Feeling of Stress : Not at all  ?Social Connections: Moderately Integrated  ? Frequency of Communication with Friends and Family: More than three times a week  ? Frequency of Social  Gatherings with Friends and Family: Never  ? Attends Religious Services: Never  ? Active Member of Clubs or Organizations: Yes  ? Attends Archivist Meetings: Never  ? Marital Status: Married  ?Intimate Partner Violence: Not At Risk  ? Fear of Current or Ex-Partner: No  ? Emotionally Abused: No  ? Physically Abused: No  ? Sexually Abused: No  ? ? ?Current Outpatient Medications on File Prior to Visit  ?Medication Sig Dispense Refill  ? aspirin EC 81 MG tablet Take 1 tablet (81 mg total) by mouth daily.    ? atorvastatin (LIPITOR) 40 MG tablet Take 1 tablet by mouth once daily 90 tablet 2  ? Blood Glucose Monitoring Suppl (ONE TOUCH ULTRA 2) w/Device KIT Use as directed to check blood sugars twice a day 1 kit 0  ? chlorthalidone (HYGROTON) 25 MG tablet TAKE 1 TABLET BY MOUTH DAILY 90 tablet 0  ? citalopram (CELEXA) 20 MG tablet Take 1 tablet (20 mg total) by mouth daily. 90 tablet 3  ? diltiazem (CARDIZEM CD) 360 MG 24 hr capsule Take 1 capsule by mouth once daily 90 capsule 0  ? insulin glargine, 2 Unit Dial, (TOUJEO MAX SOLOSTAR) 300 UNIT/ML Solostar Pen Inject 240 Units into the skin every morning. 84 mL 3  ? irbesartan (AVAPRO) 300 MG tablet TAKE 1 TABLET BY MOUTH  DAILY 90 tablet 3  ? Lancets (ONETOUCH ULTRASOFT) lancets 1 each by Other route 2 (two) times daily. Dx E11.9 and Z79.4 180 each 3  ? loratadine (CLARITIN) 10 MG tablet Take 10 mg by mouth daily as needed.    ? ONETOUCH ULTRA test strip  CHECK BLOOD SUGAR TWICE  DAILY 200 strip 3  ? pantoprazole (PROTONIX) 40 MG tablet TAKE 1 TABLET BY MOUTH  DAILY 90 tablet 3  ? spironolactone (ALDACTONE) 25 MG tablet Take 1 tablet (25 mg total) by mouth daily

## 2021-11-29 ENCOUNTER — Telehealth: Payer: Self-pay

## 2021-11-29 NOTE — Telephone Encounter (Signed)
Left message informing patient Toujeo samples are ready for pick up. ?

## 2021-12-02 NOTE — Telephone Encounter (Signed)
Attempted to return the patient's call after he left a VM to inform him that his patient assistance is ready for pick up.  ?

## 2021-12-03 NOTE — Telephone Encounter (Signed)
2nd attempt to inform the patient that patience assistance has been received and ready for pick up. LVM ?

## 2021-12-04 ENCOUNTER — Telehealth: Payer: Self-pay

## 2021-12-04 NOTE — Telephone Encounter (Signed)
Correction: Toujeo ?

## 2021-12-04 NOTE — Telephone Encounter (Signed)
Patient picked up patient assistance Toujeo - unable to note log because log not available  ?

## 2021-12-04 NOTE — Telephone Encounter (Signed)
1 additional box of Toujeo received through patient assistance. Attempted to contact the patient to inform him. LVM ?

## 2021-12-05 NOTE — Telephone Encounter (Signed)
Ozempic 0.'5mg'$  weekly has been approved and delivered to office - patient to pick up at earliest convenience ? ?Patient notes that he still has one pen of victoza left, plans to finish current prescription, then will start on ozempic 0.'5mg'$  weekly ? ?Patient will be establishing with new endocrinologist in July, will reach out with any issues or concerns ? ?Tomasa Blase, PharmD ?Clinical Pharmacist, [place]  ? ? ?

## 2021-12-05 NOTE — Telephone Encounter (Signed)
Attempted to call patient to discuss medication questions. ? ?Has been approved for toujeo and ozempic patient assistance - ozempic will be replacing victoza - patient to call back to further discuss  ? ?Tomasa Blase, PharmD ?Clinical Pharmacist, [place]  ? ?

## 2021-12-10 ENCOUNTER — Telehealth: Payer: Self-pay

## 2021-12-10 DIAGNOSIS — D485 Neoplasm of uncertain behavior of skin: Secondary | ICD-10-CM | POA: Diagnosis not present

## 2021-12-10 DIAGNOSIS — H04203 Unspecified epiphora, bilateral lacrimal glands: Secondary | ICD-10-CM | POA: Diagnosis not present

## 2021-12-10 DIAGNOSIS — H0015 Chalazion left lower eyelid: Secondary | ICD-10-CM | POA: Diagnosis not present

## 2021-12-10 DIAGNOSIS — D3102 Benign neoplasm of left conjunctiva: Secondary | ICD-10-CM | POA: Diagnosis not present

## 2021-12-10 NOTE — Telephone Encounter (Signed)
Attempted to contact the patient to inform him that we have now received 3 boxes of Toujeo. LVM ?

## 2022-01-02 ENCOUNTER — Encounter: Payer: Self-pay | Admitting: Internal Medicine

## 2022-01-28 DIAGNOSIS — H02834 Dermatochalasis of left upper eyelid: Secondary | ICD-10-CM | POA: Diagnosis not present

## 2022-01-28 DIAGNOSIS — H0015 Chalazion left lower eyelid: Secondary | ICD-10-CM | POA: Diagnosis not present

## 2022-01-28 DIAGNOSIS — H02831 Dermatochalasis of right upper eyelid: Secondary | ICD-10-CM | POA: Diagnosis not present

## 2022-01-28 DIAGNOSIS — H02401 Unspecified ptosis of right eyelid: Secondary | ICD-10-CM | POA: Diagnosis not present

## 2022-01-29 ENCOUNTER — Telehealth: Payer: Medicare Other

## 2022-01-30 ENCOUNTER — Ambulatory Visit: Payer: Medicare Other | Admitting: Gastroenterology

## 2022-02-04 ENCOUNTER — Other Ambulatory Visit: Payer: Self-pay | Admitting: Cardiovascular Disease

## 2022-02-04 ENCOUNTER — Other Ambulatory Visit: Payer: Self-pay | Admitting: Internal Medicine

## 2022-02-04 DIAGNOSIS — I10 Essential (primary) hypertension: Secondary | ICD-10-CM

## 2022-02-05 ENCOUNTER — Encounter: Payer: Self-pay | Admitting: Gastroenterology

## 2022-02-10 NOTE — Telephone Encounter (Signed)
LVM TO SCHEDULE

## 2022-02-13 ENCOUNTER — Encounter: Payer: Self-pay | Admitting: Internal Medicine

## 2022-02-13 ENCOUNTER — Ambulatory Visit (INDEPENDENT_AMBULATORY_CARE_PROVIDER_SITE_OTHER): Payer: Medicare Other | Admitting: Internal Medicine

## 2022-02-13 ENCOUNTER — Ambulatory Visit: Payer: Medicare Other | Admitting: Internal Medicine

## 2022-02-13 VITALS — BP 124/70 | HR 84 | Ht 68.0 in | Wt 242.6 lb

## 2022-02-13 DIAGNOSIS — E1151 Type 2 diabetes mellitus with diabetic peripheral angiopathy without gangrene: Secondary | ICD-10-CM | POA: Diagnosis not present

## 2022-02-13 DIAGNOSIS — E1159 Type 2 diabetes mellitus with other circulatory complications: Secondary | ICD-10-CM

## 2022-02-13 DIAGNOSIS — Z794 Long term (current) use of insulin: Secondary | ICD-10-CM | POA: Insufficient documentation

## 2022-02-13 DIAGNOSIS — E119 Type 2 diabetes mellitus without complications: Secondary | ICD-10-CM | POA: Insufficient documentation

## 2022-02-13 LAB — POCT GLYCOSYLATED HEMOGLOBIN (HGB A1C): Hemoglobin A1C: 9.5 % — AB (ref 4.0–5.6)

## 2022-02-13 NOTE — Progress Notes (Signed)
Name: [name]  Age/ Sex: 66 y.o., male   MRN/ DOB: [id number], 1956/07/09     PCP: Biagio Borg, MD   Reason for Endocrinology Evaluation: Type 2 Diabetes Mellitus  Initial Endocrine Consultative Visit: 09/06/2012    PATIENT IDENTIFIER: Mr. [name] is a 66 y.o. male with a past medical history of T2DM, HTN and Hx of bladder cancer and CAD. The patient has followed with Endocrinology clinic since 09/06/2012 for consultative assistance with management of his diabetes.  DIABETIC HISTORY:  [name] was diagnosed with DM 1992.  He used to be on Metformin- diarrhea. His hemoglobin A1c has ranged from 6.9% in 2013, peaking at 10.9% in 2021  He attributes bladder cancer to actos    SUBJECTIVE:   During the last visit (11/28/2021): Saw Dr. Loanne Drilling   Today (02/13/2022): [name]  is here for a follow up on diabetes management .He checks his blood sugars 1 times daily. The patient has  had hypoglycemic episodes since the last clinic visit. The patient is  symptomatic with these episodes   Eats 2 meals a day, snacks 2x day.   Victoza 1.8 caused nausea   He is on pt assistance with Toujeo and Ozempic   He back to  smoking    HOME DIABETES REGIMEN:  Toujeo 220 units daily  Ozempic 0.5 mg weekly ( Monday )      Statin: yes ACE-I/ARB: yes    METER DOWNLOAD SUMMARY: Date range evaluated: 6/23-02/13/2022 Fingerstick Blood Glucose Tests = 15 Overall Mean FS Glucose = 165 Standard Deviation = 49  BG Ranges: Low = 90 High = 262   Hypoglycemic Events/30 Days: BG < 50 = 0 Episodes of symptomatic severe hypoglycemia = 0    DIABETIC COMPLICATIONS: Microvascular complications:   Denies: CKD  Last Eye Exam: Completed 11/06/2021  Macrovascular complications:  CAD Denies: CVA, PVD   HISTORY:  Past Medical History:  Past Medical History:  Diagnosis Date   Allergic rhinitis    At risk for sleep apnea    STOP-BANG= 5   SENT TO PCP 03-14-2014   CAD (coronary  artery disease)    a. 07/2014 low risk MV; b. 07/2019 Cor CTA (FFR): LM 25-49 (nl), LAD mild prox/mid plaque (nl), D1 25-49p(nl w/ abnl FFR of 0.73 in inf branch), LCX/OM1 mild prox/mid plaque (nl), RCA nondominant, minimal Ca2+ plaque (nl), RPDA (mildly abnl @ 0.79)-->Med Rx..   Cataract    surgically removed bilateral   Condyloma acuminatum of penis    Diabetic neuropathy ([place])    Diastolic dysfunction    a. 05/2019 Echo: EF 60-65%, no rwam, mod LVH, impaired relaxation, nl RV size/fxn, trace MR, triv TR.   GERD (gastroesophageal reflux disease)    History of bladder cancer    s/p  turbt  2013/   transitional cell carcinoma--    History of condyloma acuminatum    PERINEAL AREA  W/ RECURRENCY   History of gout    Hyperlipidemia    Hypertension    Lower urinary tract symptoms (LUTS)    PAF (paroxysmal atrial fibrillation) ([place])    a. 06/2019 Event monitor: PAF <1% burden. Longest 3 mins 36 secs.   Productive cough    PSVT (paroxysmal supraventricular tachycardia) ([place])    a. 06/2019 Event monitor: 112 episodes of SVT, longest 21 secs.   PVD (peripheral vascular disease) with claudication ([place])    a. 03/2014 LE art duplex: long segment occlusion of mid to distal R SFA;  b. 03/2020 ABI: nl left and mildly improved R ABI->med rx.   Renal artery stenosis ([facility])    Renal artery stenosis ([place])    a. 12/2020 Renal art duplex: RRA 1-59%, LRA >60%.   Smokers' cough ([place])    Type 2 diabetes mellitus with insulin therapy ([place]) 1992   monitor by  dr ellsion   Wears dentures    upper   Past Surgical History:  Past Surgical History:  Procedure Laterality Date   AXILLARY HIDRADENITIS EXCISION  1997   CARDIOVASCULAR STRESS TEST  07-24-2014  dr Kathlyn [place]   Low risk lexiscan nuclear study with apical thinning and small inferolateral wall infarct at mid & basal level , no ischemia/  normal LVF and wall motion , ef 59%   CATARACT EXTRACTION Left    CATARACT EXTRACTION W/ INTRAOCULAR LENS IMPLANT  Right    CO2 LASER APPLICATION N/A 7/90/3833   Procedure: CO2 LASER APPLICATION,PENIS, GROIN, ANUS;  Surgeon: [name], MD;  Location: [facility];  Service: General;  Laterality: N/A;   CO2 LASER APPLICATION N/A 38/32/9191   Procedure: CO2 LASER APPLICATION;  Surgeon: Kathie Rhodes, MD;  Location: [facility];  Service: Urology;  Laterality: N/A;   CONDYLOMA EXCISION/FULGURATION N/A 05/21/2015   Procedure: CONDYLOMA REMOVAL;  Surgeon: Kathie Rhodes, MD;  Location: [facility];  Service: Urology;  Laterality: N/A;   HEMORRHOID SURGERY  10/24/2014   Procedure: HEMORRHOIDECTOMY;  Surgeon: Michael Boston, MD;  Location: [facility];  Service: General;;   INCISION AND DRAINAGE ABSCESS Left 10/24/2014   Procedure: INCISION AND DRAINAGE ABSCESS;  Surgeon: Michael Boston, MD;  Location: [facility];  Service: General;  Laterality: Left;   INGUINAL HIDRADENITIS [place] N/A 03/20/2014   Procedure: EXAM UNDER ANESTHESIA, REMOVAL/ABLATION OF CONDYLOMATA PENIS,GROINS, ANUS, ANAL CANAL;  Surgeon: [name], MD;  Location: [facility];  Service: General;  Laterality: N/A;  groin and anus   LASER ABLATION CONDOLAMATA N/A 10/24/2014   Procedure: LASER ABLATION CONDOLAMATA;  Surgeon: Michael Boston, MD;  Location: [facility];  Service: General;  Laterality: N/A;   LASER ABLATION OF PENILE AND PERIANAL WARTS  07-29-2007  Dr. Johney Maine   LEFT SHOULDER SURGERY  2003   MASS EXCISION N/A 10/24/2014   Procedure: EXCISION OF PERINEAL MASS/SINUS;  Surgeon: Michael Boston, MD;  Location: [place];  Service: General;  Laterality: N/A;   MOHS SURGERY     back   MOHS SURGERY  2017   face   MULTIPLE TOOTH EXTRACTIONS     PERINEAL HIDRADENITIS [place] TUMOR  05/21/2012   Procedure: TRANSURETHRAL RESECTION OF  BLADDER TUMOR (TURBT);  Surgeon: Claybon Jabs, MD;  Location: [facility];  Service: Urology;  Laterality: N/A;      Social History:  reports that he quit smoking about 5 years ago. His smoking use included cigars and cigarettes. He has a 41.00 pack-year smoking history. He has never used smokeless tobacco. He reports that he does not drink alcohol and does not use drugs. Family History:  Family History  Problem Relation Age of Onset   Hypertension Mother    Cancer Father        lung ca   Diabetes Maternal Aunt        x 2   Colon cancer Neg Hx    Esophageal cancer  Neg Hx    Pancreatic cancer Neg Hx    Prostate cancer Neg Hx    Kidney disease Neg Hx    Liver disease Neg Hx    Lung cancer Neg Hx    Rectal cancer Neg Hx    Stomach cancer Neg Hx      HOME MEDICATIONS: Allergies as of 02/13/2022       Reactions   Cefepime Hives   Had allergic reaction to one of these 3 agents, unclear which one   Metoprolol Hives   Had allergic reaction to one of these 3 agents, unclear which one *Per RN, highly likely this is the cause of allergic rxn   Myrbetriq [mirabegron] Hives   Had allergic reaction to one of these 3 agents, unclear which one        Medication List        Accurate as of February 13, 2022 12:20 PM. If you have any questions, ask your nurse or doctor.          STOP taking these medications    Victoza 18 MG/3ML Sopn Generic drug: liraglutide Stopped by: Dorita Sciara, MD       TAKE these medications    aspirin EC 81 MG tablet Take 1 tablet (81 mg total) by mouth daily.   atorvastatin 40 MG tablet Commonly known as: LIPITOR Take 1 tablet by mouth once daily   chlorthalidone 25 MG tablet Commonly known as: HYGROTON TAKE 1 TABLET BY MOUTH DAILY   citalopram 20 MG tablet Commonly known as: CELEXA Take 1 tablet (20 mg total) by mouth daily.   diltiazem 360 MG 24 hr capsule Commonly known as: CARDIZEM CD Take 1 capsule by mouth  once daily   irbesartan 300 MG tablet Commonly known as: AVAPRO TAKE 1 TABLET BY MOUTH  DAILY   loratadine 10 MG tablet Commonly known as: CLARITIN Take 10 mg by mouth daily as needed.   ONE TOUCH ULTRA 2 w/Device Kit Use as directed to check blood sugars twice a day   OneTouch Ultra test strip Generic drug: glucose blood CHECK BLOOD SUGAR TWICE  DAILY   onetouch ultrasoft lancets 1 each by Other route 2 (two) times daily. Dx E11.9 and Z79.4   Ozempic (0.25 or 0.5 MG/DOSE) 2 MG/3ML Sopn Generic drug: Semaglutide(0.25 or 0.5MG/DOS) Inject 0.25 mg into the skin once a week.   pantoprazole 40 MG tablet Commonly known as: PROTONIX TAKE 1 TABLET BY MOUTH  DAILY   spironolactone 25 MG tablet Commonly known as: ALDACTONE Take 1 tablet (25 mg total) by mouth daily.   Toujeo Max SoloStar 300 UNIT/ML Solostar Pen Generic drug: insulin glargine (2 Unit Dial) Inject 240 Units into the skin every morning.   VITAMIN D (CHOLECALCIFEROL) PO Take 5,000 Units by mouth daily.         OBJECTIVE:   Vital Signs: BP 124/70 (BP Location: Left Arm, Patient Position: Sitting, Cuff Size: Large)   Pulse 84   Ht 5' 8"  (1.727 m)   Wt 242 lb 9.6 oz (110 kg)   SpO2 96%   BMI 36.89 kg/m   Wt Readings from Last 3 Encounters:  02/13/22 242 lb 9.6 oz (110 kg)  11/28/21 246 lb 12.8 oz (111.9 kg)  09/19/21 250 lb (113.4 kg)     Exam: General: Pt appears well and is in NAD  Neck: General: Supple without adenopathy. Thyroid: Thyroid size normal.  No goiter or nodules appreciated.  Lungs: Clear with good BS bilat  with no rales, rhonchi, or wheezes  Heart: RRR  Extremities: No pretibial edema.   Neuro: MS is good with appropriate affect, pt is alert and Ox3       DATA REVIEWED:  Lab Results  Component Value Date   HGBA1C 9.5 (A) 02/13/2022   HGBA1C 8.7 (A) 11/28/2021   HGBA1C 8.3 (A) 09/19/2021   Lab Results  Component Value Date   MICROALBUR 18.0 (H) 10/25/2020   LDLCALC 35  10/20/2019   CREATININE 1.27 (H) 08/21/2021   Lab Results  Component Value Date   MICRALBCREAT 22.8 10/25/2020     Lab Results  Component Value Date   CHOL 102 05/01/2021   HDL 27.80 (L) 05/01/2021   LDLCALC 35 10/20/2019   LDLDIRECT 54.0 05/01/2021   TRIG 264.0 (H) 05/01/2021   CHOLHDL 4 05/01/2021       Old records , labs and images have been reviewed.    ASSESSMENT / PLAN / RECOMMENDATIONS:   1) Type 2 Diabetes Mellitus, poorly controlled, With macrovascular  complications - Most recent A1c of 9.5 %. Goal A1c < 7.0 %.    -His A1c has trended up from 8.7% -He is intolerant to Victoza 1.8 mg -Tolerating Ozempic, will increase to the next dose up as below -He receives his Ozempic and Toujeo through patient assistance -I will reduce his Toujeo due to tight BG's of 90 mg/DL fasting -I did entertain the idea of putting him on prandial dose of insulin, but he tells me that he used to be on it and his BG's trended down to the 70s -Intolerant to metformin -Patient attributes bladder cancer to pioglitazone use -Not a candidate for SGLT2 inhibitors at this time due to history of bladder cancer and increased risk of infections -I have emphasized the importance of limiting CHO intake and avoiding snacks, we also discussed that if he must snack to choose low-carb options     MEDICATIONS: Decrease Toujeo to 210 units daily Increase Ozempic 0.5 mg weekly  EDUCATION / INSTRUCTIONS: BG monitoring instructions: Patient is instructed to check his blood sugars 1 times a day, fasting. Call [facility] if: BG persistently < 70 I reviewed the Rule of 15 for the treatment of hypoglycemia in detail with the patient. Literature supplied.   2) Diabetic complications:  Eye: Does not have known diabetic retinopathy.  Neuro/ Feet: Does not have known diabetic peripheral neuropathy .  Renal: Patient does not have known baseline CKD. He   is  on an ACEI/ARB at present.       F/U in 4 months     Signed electronically by: Mack Guise, MD  [facility] Endocrinology  [facility] [address] [place], [place] [address] Phone: [phone] FAX: [phone]   CC: Biagio [address], [place] [address] Phone: [phone]  Fax: [phone]  Return to Endocrinology clinic as below: Future Appointments  Date Time Provider [place]  03/12/2022 11:30 AM Zehr, Laban Emperor, PA-C [facility]

## 2022-02-13 NOTE — Patient Instructions (Addendum)
Decrease Toujeo 210 units daily  Increase Ozempic 0.5 mg weekly     HOW TO TREAT LOW BLOOD SUGARS (Blood sugar LESS THAN 70 MG/DL) Please follow the RULE OF 15 for the treatment of hypoglycemia treatment (when your (blood sugars are less than 70 mg/dL)   STEP 1: Take 15 grams of carbohydrates when your blood sugar is low, which includes:  3-4 GLUCOSE TABS  OR 3-4 OZ OF JUICE OR REGULAR SODA OR ONE TUBE OF GLUCOSE GEL    STEP 2: RECHECK blood sugar in 15 MINUTES STEP 3: If your blood sugar is still low at the 15 minute recheck --> then, go back to STEP 1 and treat AGAIN with another 15 grams of carbohydrates.

## 2022-02-19 ENCOUNTER — Telehealth: Payer: Self-pay

## 2022-02-19 NOTE — Telephone Encounter (Signed)
Attempted to contact the patient to inform him that we have received 15 boxes of Toujeo via patient assistance and is ready for pick up. Line rang busy several times and unable to leave a voicemail.

## 2022-03-03 ENCOUNTER — Other Ambulatory Visit: Payer: Self-pay | Admitting: Internal Medicine

## 2022-03-03 NOTE — Telephone Encounter (Signed)
Please refill as per office routine med refill policy (all routine meds to be refilled for 3 mo or monthly (per pt preference) up to one year from last visit, then month to month grace period for 3 mo, then further med refills will have to be denied) ? ?

## 2022-03-12 ENCOUNTER — Ambulatory Visit: Payer: Medicare Other | Admitting: Gastroenterology

## 2022-03-12 ENCOUNTER — Encounter: Payer: Self-pay | Admitting: Gastroenterology

## 2022-03-12 VITALS — BP 124/54 | HR 64 | Ht 68.25 in | Wt 238.5 lb

## 2022-03-12 DIAGNOSIS — Z794 Long term (current) use of insulin: Secondary | ICD-10-CM

## 2022-03-12 DIAGNOSIS — K529 Noninfective gastroenteritis and colitis, unspecified: Secondary | ICD-10-CM | POA: Insufficient documentation

## 2022-03-12 DIAGNOSIS — E119 Type 2 diabetes mellitus without complications: Secondary | ICD-10-CM

## 2022-03-12 DIAGNOSIS — Z860100 Personal history of colon polyps, unspecified: Secondary | ICD-10-CM | POA: Insufficient documentation

## 2022-03-12 MED ORDER — NA SULFATE-K SULFATE-MG SULF 17.5-3.13-1.6 GM/177ML PO SOLN: 1.0000 | Freq: Once | ORAL | 0 refills | Status: AC

## 2022-03-12 NOTE — Patient Instructions (Signed)
If you are age 65 or older, your body mass index should be between 23-30. Your Body mass index is 36 kg/m. If this is out of the aforementioned range listed, please consider follow up with your Primary Care Provider.  If you are age 18 or younger, your body mass index should be between 19-25. Your Body mass index is 36 kg/m. If this is out of the aformentioned range listed, please consider follow up with your Primary Care Provider.  You have been scheduled for a colonoscopy. Please follow written instructions given to you at your visit today.  Please pick up your prep supplies at the pharmacy within the next 1-3 days. If you use inhalers (even only as needed), please bring them with you on the day of your procedure.   The [facility] providers would like to encourage you to use [facility] to communicate with providers for non-urgent requests or questions.  Due to long hold times on the telephone, sending your provider a message by [facility] may be a faster and more efficient way to get a response.  Please allow 48 business hours for a response.  Please remember that this is for non-urgent requests.   It was a pleasure to see you today!  Thank you for trusting me with your gastrointestinal care!    Alonza Bogus, PA-C

## 2022-03-12 NOTE — Progress Notes (Signed)
Assessment and plan noted ?

## 2022-03-12 NOTE — Progress Notes (Signed)
03/12/2022 [name] [id number] 11-Mar-1956   HISTORY OF PRESENT ILLNESS:  This is a 66 year old male who is a patient of Dr. Blanch Media.  He has history of bladder cancer, adult-onset diabetes mellitus insulin-dependent, hyperlipidemia, hypertension, peripheral arterial disease. He has history of multiple adenomatous colon polyps on colonoscopy in April 2016 at which time he had several polyps removed, 2 of these were 15 mm and one was 20 mm in size. Path on all of the polyps consistent with tubular adenomas.   Then colonoscopy 07/2018 showed the following:  - Eight 3 to 8 mm polyps in the sigmoid colon, in the descending colon, in the transverse colon and in the ascending colon, removed with a cold snare. Resected and retrieved. - The examination was otherwise normal on direct and retroflexion views.  1. Surgical [P], ascending, polyp (2) - TUBULAR ADENOMA (2 FRAGMENTS) - MULTIPLE FRAGMENTS OF BENIGN COLONIC MUCOSA - NO HIGH GRADE DYSPLASIA OR MALIGNANCY IDENTIFIED 2. Surgical [P], transverse, polyp (2) - TUBULAR ADENOMA (3 OF 5 FRAGMENTS) - BENIGN COLONIC MUCOSA (2 OF 5 FRAGMENTS) - NO HIGH GRADE DYSPLASIA OR MALIGNANCY IDENTIFIED 3. Surgical [P], transverse, polyp - TUBULAR ADENOMA (3 FRAGMENTS) - MULTIPLE FRAGMENTS OF BENIGN COLONIC MUCOSA - NO HIGH GRADE DYSPLASIA OR MALIGNANCY IDENTIFIED 4. Surgical [P], descending and sigmoid-2, polyp (3) - TUBULAR ADENOMA (3 OF 3 FRAGMENTS) - NO HIGH GRADE DYSPLASIA OR MALIGNANCY IDENTIFIED  Was placed in for 3-year colonoscopy recall.  He is here today to schedule that.  It is listed that he was on a blood thinner, but the only thing that he is taking currently is an 81 mg aspirin.  He is on some diabetic medications.  He tells me that he has had longstanding issues with diarrhea.  This tends to occur shortly after eating and associated with urgency and incontinence at times as well.  Does have some normal solid stools in between  sometimes.  Seems to be anything that he eats "goes right through him".  No rectal bleeding.  Does have some nocturnal stools as well.  This has been going on for approximately 3 to 4 years.  He says the Ozempic is only new over the past 1-2 months.  Past Medical History:  Diagnosis Date   Adenomatous colon polyp    Allergic rhinitis    At risk for sleep apnea    STOP-BANG= 5   SENT TO PCP 03-14-2014   Bladder cancer (HCC)    CAD (coronary artery disease)    a. 07/2014 low risk MV; b. 07/2019 Cor CTA (FFR): LM 25-49 (nl), LAD mild prox/mid plaque (nl), D1 25-49p(nl w/ abnl FFR of 0.73 in inf branch), LCX/OM1 mild prox/mid plaque (nl), RCA nondominant, minimal Ca2+ plaque (nl), RPDA (mildly abnl @ 0.79)-->Med Rx..   Cataract    surgically removed bilateral   Condyloma acuminatum of penis    Diabetic neuropathy ([place])    Diastolic dysfunction    a. 05/2019 Echo: EF 60-65%, no rwam, mod LVH, impaired relaxation, nl RV size/fxn, trace MR, triv TR.   Diverticulosis    GERD (gastroesophageal reflux disease)    History of bladder cancer    s/p  turbt  2013/   transitional cell carcinoma--    History of condyloma acuminatum    PERINEAL AREA  W/ RECURRENCY   History of gout    Hyperlipidemia    Hypertension    Lower urinary tract symptoms (LUTS)    PAF (paroxysmal atrial fibrillation) (  [place])    a. 06/2019 Event monitor: PAF <1% burden. Longest 3 mins 36 secs.   Productive cough    PSVT (paroxysmal supraventricular tachycardia) ([place])    a. 06/2019 Event monitor: 112 episodes of SVT, longest 21 secs.   PVD (peripheral vascular disease) with claudication ([place])    a. 03/2014 LE art duplex: long segment occlusion of mid to distal R SFA; b. 03/2020 ABI: nl left and mildly improved R ABI->med rx.   Renal artery stenosis (HCC)    Renal artery stenosis ([place])    a. 12/2020 Renal art duplex: RRA 1-59%, LRA >60%.   Smokers' cough ([place])    Type 2 diabetes mellitus with insulin therapy ([place]) 1992    monitor by  dr ellsion   Wears dentures    upper   Past Surgical History:  Procedure Laterality Date   AXILLARY HIDRADENITIS [place] TEST  07-24-2014  dr Kathlyn [place]   Low risk [place] nuclear study with apical thinning and small inferolateral wall infarct at mid & basal level , no ischemia/  normal LVF and wall motion , ef 59%   CATARACT EXTRACTION Left    CATARACT EXTRACTION W/ INTRAOCULAR LENS IMPLANT Right    CO2 LASER APPLICATION N/A 11/17/1446   Procedure: CO2 LASER APPLICATION,PENIS, GROIN, ANUS;  Surgeon: [name], MD;  Location: [facility];  Service: General;  Laterality: N/A;   CO2 LASER APPLICATION N/A 18/56/3149   Procedure: CO2 LASER APPLICATION;  Surgeon: Kathie Rhodes, MD;  Location: [place] [facility];  Service: Urology;  Laterality: N/A;   CONDYLOMA EXCISION/FULGURATION N/A 05/21/2015   Procedure: CONDYLOMA REMOVAL;  Surgeon: Kathie Rhodes, MD;  Location[facility];  Service: Urology;  Laterality: N/A;   HEMORRHOID SURGERY  10/24/2014   Procedure: HEMORRHOIDECTOMY;  Surgeon: Michael Boston, MD;  Location: [facility];  Service: General;;   INCISION AND DRAINAGE ABSCESS Left 10/24/2014   Procedure: INCISION AND DRAINAGE ABSCESS;  Surgeon: Michael Boston, MD;  Location: [place];  Service: General;  Laterality: Left;   INGUINAL HIDRADENITIS [place] N/A 03/20/2014   Procedure: EXAM UNDER ANESTHESIA, REMOVAL/ABLATION OF CONDYLOMATA PENIS,GROINS, ANUS, ANAL CANAL;  Surgeon: [name], MD;  Location: [place];  Service: General;  Laterality: N/A;  groin and anus   LASER ABLATION CONDOLAMATA N/A 10/24/2014   Procedure: LASER ABLATION CONDOLAMATA;  Surgeon: Michael Boston, MD;  Location: [place];  Service: General;  Laterality: N/A;   LASER ABLATION OF PENILE AND PERIANAL WARTS  07-29-2007  Dr.  Johney Maine   LEFT SHOULDER SURGERY  2003   MASS EXCISION N/A 10/24/2014   Procedure: EXCISION OF PERINEAL MASS/SINUS;  Surgeon: Michael Boston, MD;  Location: [place];  Service: General;  Laterality: N/A;   MOHS SURGERY     back   MOHS SURGERY  2017   face   MULTIPLE TOOTH EXTRACTIONS     PERINEAL HIDRADENITIS [place] TUMOR  05/21/2012   Procedure: TRANSURETHRAL RESECTION OF BLADDER TUMOR (TURBT);  Surgeon: Claybon Jabs, MD;  Location: [facility];  Service: Urology;  Laterality: N/A;       reports that he has been smoking cigars and cigarettes. He has a 41.00 pack-year smoking history. He has never used smokeless tobacco. He reports that he does not drink alcohol and does not use  drugs. family history includes Diabetes in his maternal aunt; Hypertension in his mother; Lung cancer in his father. Allergies  Allergen Reactions   Cefepime Hives    Had allergic reaction to one of these 3 agents, unclear which one   Metoprolol Hives    Had allergic reaction to one of these 3 agents, unclear which one  *Per RN, highly likely this is the cause of allergic rxn   Myrbetriq [Mirabegron] Hives    Had allergic reaction to one of these 3 agents, unclear which one      Outpatient Encounter Medications as of 03/12/2022  Medication Sig   aspirin EC 81 MG tablet Take 1 tablet (81 mg total) by mouth daily.   atorvastatin (LIPITOR) 40 MG tablet Take 1 tablet by mouth once daily   Blood Glucose Monitoring Suppl (ONE TOUCH ULTRA 2) w/Device KIT Use as directed to check blood sugars twice a day   chlorthalidone (HYGROTON) 25 MG tablet TAKE 1 TABLET BY MOUTH DAILY   citalopram (CELEXA) 20 MG tablet Take 1 tablet (20 mg total) by mouth daily.   diltiazem (CARDIZEM CD) 360 MG 24 hr capsule Take 1 capsule (360 mg total) by mouth daily. Overdue for Annual appt must see provider for future refills   insulin glargine, 2 Unit Dial,  (TOUJEO MAX SOLOSTAR) 300 UNIT/ML Solostar Pen Inject 240 Units into the skin every morning.   irbesartan (AVAPRO) 300 MG tablet TAKE 1 TABLET BY MOUTH  DAILY   Lancets (ONETOUCH ULTRASOFT) lancets 1 each by Other route 2 (two) times daily. Dx E11.9 and Z79.4   loratadine (CLARITIN) 10 MG tablet Take 10 mg by mouth daily as needed.   ONETOUCH ULTRA test strip CHECK BLOOD SUGAR TWICE  DAILY   pantoprazole (PROTONIX) 40 MG tablet TAKE 1 TABLET BY MOUTH  DAILY   Semaglutide,0.25 or 0.5MG/DOS, (OZEMPIC, 0.25 OR 0.5 MG/DOSE,) 2 MG/3ML SOPN Inject 0.25 mg into the skin once a week.   spironolactone (ALDACTONE) 25 MG tablet Take 1 tablet (25 mg total) by mouth daily.   VITAMIN D, CHOLECALCIFEROL, PO Take 5,000 Units by mouth daily.   No facility-administered encounter medications on file as of 03/12/2022.     REVIEW OF SYSTEMS  : All other systems reviewed and negative except where noted in the History of Present Illness.   PHYSICAL EXAM: BP (!) 124/54 (BP Location: Left Arm, Patient Position: Sitting, Cuff Size: Normal)   Pulse 64   Ht 5' 8.25" (1.734 m) Comment: height measured without shoes  Wt 238 lb 8 oz (108.2 kg)   BMI 36.00 kg/m  General: Well developed male in no acute distress Head: Normocephalic and atraumatic Eyes:  Sclerae anicteric, conjunctiva pink. Ears: Normal auditory acuity Lungs: Clear throughout to auscultation; no W/R/R. Heart: Regular rate and rhythm; no M/R/G. Abdomen: Soft, obese.  BS present.  Ventral Non-tender. Rectal:  Will be done at the time of colonoscopy. Musculoskeletal: Symmetrical with no gross deformities  Skin: No lesions on visible extremities Extremities: No edema  Neurological: Alert oriented x 4, grossly non-focal Psychological:  Alert and cooperative. Normal mood and affect  ASSESSMENT AND PLAN: *Personal history of colon polyps: Had multiple adenomas removed in December 2019.  Repeat was recommended at 3-year interval.  We will schedule with  Dr. [name]. *Chronic diarrhea with urgency and incontinence: This has been occurring for years, particularly after eating.  We will also Dr. [name] perform random biopsies to rule out microscopic colitis during his procedure.  Question if it  could be dietary related versus IBS *IDDM:  Insulin will be adjusted prior to endoscopic procedure per protocol. Will resume normal dosing after procedure.    CC:  Biagio Borg, MD

## 2022-03-17 ENCOUNTER — Encounter: Payer: Self-pay | Admitting: Internal Medicine

## 2022-03-17 ENCOUNTER — Other Ambulatory Visit: Payer: Self-pay | Admitting: Internal Medicine

## 2022-03-17 NOTE — Telephone Encounter (Signed)
Please refill as per office routine med refill policy (all routine meds to be refilled for 3 mo or monthly (per pt preference) up to one year from last visit, then month to month grace period for 3 mo, then further med refills will have to be denied) ? ?

## 2022-03-18 NOTE — Telephone Encounter (Signed)
See note below

## 2022-03-18 NOTE — Telephone Encounter (Signed)
Scheduled

## 2022-03-19 ENCOUNTER — Telehealth: Payer: Self-pay

## 2022-03-19 NOTE — Telephone Encounter (Signed)
Called and left message for patient that Ozempic had arrived.  Placed in [place] for pick up (nurse side)

## 2022-03-24 ENCOUNTER — Encounter (AMBULATORY_SURGERY_CENTER): Payer: Medicare Other | Admitting: Internal Medicine

## 2022-03-24 ENCOUNTER — Encounter: Payer: Self-pay | Admitting: Internal Medicine

## 2022-03-24 VITALS — BP 98/62 | HR 64 | Temp 96.6°F | Resp 20 | Ht 68.0 in | Wt 238.0 lb

## 2022-03-24 DIAGNOSIS — D125 Benign neoplasm of sigmoid colon: Secondary | ICD-10-CM

## 2022-03-24 DIAGNOSIS — K621 Rectal polyp: Secondary | ICD-10-CM

## 2022-03-24 DIAGNOSIS — Z86010 Personal history of colonic polyps: Secondary | ICD-10-CM | POA: Diagnosis not present

## 2022-03-24 DIAGNOSIS — D124 Benign neoplasm of descending colon: Secondary | ICD-10-CM | POA: Diagnosis not present

## 2022-03-24 DIAGNOSIS — Z09 Encounter for follow-up examination after completed treatment for conditions other than malignant neoplasm: Secondary | ICD-10-CM

## 2022-03-24 DIAGNOSIS — K635 Polyp of colon: Secondary | ICD-10-CM | POA: Diagnosis not present

## 2022-03-24 DIAGNOSIS — D128 Benign neoplasm of rectum: Secondary | ICD-10-CM

## 2022-03-24 DIAGNOSIS — D123 Benign neoplasm of transverse colon: Secondary | ICD-10-CM | POA: Diagnosis not present

## 2022-03-24 MED ORDER — SODIUM CHLORIDE 0.9 % IV SOLN: 500.0000 mL | Freq: Once | INTRAVENOUS | Status: DC

## 2022-03-24 NOTE — Progress Notes (Signed)
Called to room to assist during endoscopic procedure.  Patient ID and intended procedure confirmed with present staff. Received instructions for my participation in the procedure from the performing physician.  

## 2022-03-24 NOTE — Progress Notes (Signed)
PT taken to PACU. Monitors in place. VSS. Report given to RN. 

## 2022-03-24 NOTE — Op Note (Signed)
[name]: [name] Procedure Date: 03/24/2022 2:46 PM MRN: [id number] Endoscopist: Docia Chuck. [name] , MD Age: 66 Referring MD:  Date of Birth: 08/16/1955 Gender: Male Account #: [id number] Procedure:                Colonoscopy with cold snare polypectomy x8; biopsy                            polypectomy x 1 Indications:              High risk colon cancer surveillance: Personal                            history of adenoma (10 mm or greater in size), High                            risk colon cancer surveillance: Personal history of                            multiple (3 or more) adenomas. Previous                            examinations 2007, 2011, 2016, 2019 Medicines:                Monitored Anesthesia Care Procedure:                Pre-Anesthesia Assessment:                           - Prior to the procedure, a History and Physical                            was performed, and patient medications and                            allergies were reviewed. The patient's tolerance of                            previous anesthesia was also reviewed. The risks                            and benefits of the procedure and the sedation                            options and risks were discussed with the patient.                            All questions were answered, and informed consent                            was obtained. Prior Anticoagulants: The patient has                            taken no previous anticoagulant or antiplatelet  agents. ASA Grade Assessment: II - A patient with                            mild systemic disease. After reviewing the risks                            and benefits, the patient was deemed in                            satisfactory condition to undergo the procedure.                           After obtaining informed consent, the colonoscope                            was passed under direct vision.  Throughout the                            procedure, the patient's blood pressure, pulse, and                            oxygen saturations were monitored continuously. The                            CF [id number] #[id number] was introduced through the anus                            and advanced to the the cecum, identified by                            appendiceal orifice and ileocecal valve. The                            ileocecal valve, appendiceal orifice, and rectum                            were photographed. The quality of the bowel                            preparation was excellent. The colonoscopy was                            performed without difficulty. The patient tolerated                            the procedure well. The bowel preparation used was                            SUPREP via split dose instruction. Scope In: 2:55:19 PM Scope Out: 3:15:37 PM Scope Withdrawal Time: 0 hours 17 minutes 59 seconds  Total Procedure Duration: 0 hours 20 minutes 18 seconds  Findings:                 A 1 mm polyp was found in the  rectum. The polyp was                            removed with a jumbo cold forceps. Resection and                            retrieval were complete.                           Eight polyps were found in the sigmoid colon,                            descending colon and transverse colon. The polyps                            were 3 to 8 mm in size. These polyps were removed                            with a cold snare. Resection and retrieval were                            complete.                           Multiple diverticula were found in the sigmoid                            colon.                           The exam was otherwise without abnormality on                            direct and retroflexion views. Complications:            No immediate complications. Estimated blood loss:                            None. Estimated Blood Loss:     Estimated  blood loss: none. Impression:               - One 1 mm polyp in the rectum, removed with a                            jumbo cold forceps. Resected and retrieved.                           - Eight 3 to 8 mm polyps in the sigmoid colon, in                            the descending colon and in the transverse colon,                            removed with a cold snare. Resected and retrieved.                           -  Diverticulosis in the sigmoid colon.                           - The examination was otherwise normal on direct                            and retroflexion views. Recommendation:           - Repeat colonoscopy in 3 years for surveillance.                           - Patient has a contact number available for                            emergencies. The signs and symptoms of potential                            delayed complications were discussed with the                            patient. Return to normal activities tomorrow.                            Written discharge instructions were provided to the                            patient.                           - Resume previous diet.                           - Continue present medications.                           - Await pathology results. Docia Chuck. Paul Pastor, MD 03/24/2022 3:23:18 PM This report has been signed electronically.

## 2022-03-24 NOTE — Progress Notes (Signed)
HISTORY OF PRESENT ILLNESS:  [name] is a 66 y.o. male with history of multiple and advanced adenomatous colon polyps.  Previous examinations 2007, 2011, 2016, 2019.  Presents today for surveillance.  No complaints  REVIEW OF SYSTEMS:  All non-GI ROS negative. Past Medical History:  Diagnosis Date   Adenomatous colon polyp    Allergic rhinitis    At risk for sleep apnea    STOP-BANG= 5   SENT TO PCP 03-14-2014   Bladder cancer (HCC)    CAD (coronary artery disease)    a. 07/2014 low risk MV; b. 07/2019 Cor CTA (FFR): LM 25-49 (nl), LAD mild prox/mid plaque (nl), D1 25-49p(nl w/ abnl FFR of 0.73 in inf branch), LCX/OM1 mild prox/mid plaque (nl), RCA nondominant, minimal Ca2+ plaque (nl), RPDA (mildly abnl @ 0.79)-->Med Rx..   Cataract    surgically removed bilateral   Condyloma acuminatum of penis    Diabetic neuropathy ([name])    Diastolic dysfunction    a. 05/2019 Echo: EF 60-65%, no rwam, mod LVH, impaired relaxation, nl RV size/fxn, trace MR, triv TR.   Diverticulosis    GERD (gastroesophageal reflux disease)    History of bladder cancer    s/p  turbt  2013/   transitional cell carcinoma--    History of condyloma acuminatum    PERINEAL AREA  W/ RECURRENCY   History of gout    Hyperlipidemia    Hypertension    Lower urinary tract symptoms (LUTS)    PAF (paroxysmal atrial fibrillation) ([name])    a. 06/2019 Event monitor: PAF <1% burden. Longest 3 mins 36 secs.   Productive cough    PSVT (paroxysmal supraventricular tachycardia) ([name])    a. 06/2019 Event monitor: 112 episodes of SVT, longest 21 secs.   PVD (peripheral vascular disease) with claudication ([name])    a. 03/2014 LE art duplex: long segment occlusion of mid to distal R SFA; b. 03/2020 ABI: nl left and mildly improved R ABI->med rx.   Renal artery stenosis (HCC)    Renal artery stenosis ([place])    a. 12/2020 Renal art duplex: RRA 1-59%, LRA >60%.   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  dr Kathlyn [place]   Low risk [place] nuclear study with apical thinning and small inferolateral wall infarct at mid & basal level , no ischemia/  normal LVF and wall motion , ef 59%   CATARACT EXTRACTION Left    CATARACT EXTRACTION W/ INTRAOCULAR LENS IMPLANT Right    CO2 LASER APPLICATION N/A 12/26/6158   Procedure: CO2 LASER APPLICATION,PENIS, GROIN, ANUS;  Surgeon: [name], MD;  Location: [facility];  Service: General;  Laterality: N/A;   CO2 LASER APPLICATION N/A 73/71/0626   Procedure: CO2 LASER APPLICATION;  Surgeon: Kathie Rhodes, MD;  Location: [facility];  Service: Urology;  Laterality: N/A;   CONDYLOMA EXCISION/FULGURATION N/A 05/21/2015   Procedure: CONDYLOMA REMOVAL;  Surgeon: Kathie Rhodes, MD;  Location: [facility];  Service: Urology;  Laterality: N/A;   HEMORRHOID SURGERY  10/24/2014   Procedure: HEMORRHOIDECTOMY;  Surgeon: Michael Boston, MD;  Location: [facility];  Service: General;;   INCISION AND DRAINAGE ABSCESS Left 10/24/2014   Procedure:  INCISION AND DRAINAGE ABSCESS;  Surgeon: Michael Boston, MD;  Location: [place] [facility];  Service: General;  Laterality: Left;   INGUINAL HIDRADENITIS [place] N/A 03/20/2014   Procedure: EXAM UNDER ANESTHESIA, REMOVAL/ABLATION OF CONDYLOMATA PENIS,GROINS, ANUS, ANAL CANAL;  Surgeon: [name], MD;  Location: [facility];  Service: General;  Laterality: N/A;  groin and anus   LASER ABLATION CONDOLAMATA N/A 10/24/2014   Procedure: LASER ABLATION CONDOLAMATA;  Surgeon: Michael Boston, MD;  Location: [facility];  Service: General;  Laterality: N/A;   LASER ABLATION OF PENILE AND PERIANAL WARTS   07-29-2007  Dr. 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 He has a 41.00 pack-year smoking history. He has never used smokeless tobacco. He reports that he does not drink alcohol and does not use drugs.  family history includes Diabetes in his maternal aunt; Hypertension in his mother; Lung cancer in his father.  Allergies  Allergen Reactions   Cefepime Hives    Had allergic reaction to one of these 3 agents, unclear which one   Metoprolol Hives    Had allergic reaction to one of these 3 agents, unclear which one  *Per RN, highly likely this is the cause of allergic rxn   Myrbetriq [Mirabegron] Hives    Had allergic reaction to one of these 3 agents, unclear which one       PHYSICAL EXAMINATION: Vital signs: BP 125/82   Pulse 79   Temp (!) 96.6 F (35.9 C)   Ht '5\' 8"'$  (1.727 m)   Wt 238 lb (108 kg)   SpO2 96%   BMI 36.19 kg/m  General: Well-developed, well-nourished, no acute distress HEENT: Sclerae are anicteric, conjunctiva pink. Oral mucosa intact Lungs: Clear Heart: Regular Abdomen: soft, nontender, nondistended, no obvious ascites, no peritoneal signs, normal bowel sounds. No organomegaly. Extremities: No edema Psychiatric: alert and oriented x3. Cooperative      ASSESSMENT:  History of multiple advanced adenomatous colon  polyps   PLAN:  Surveillance colonoscopy

## 2022-03-24 NOTE — Progress Notes (Signed)
Pt's states no medical or surgical changes since previsit or office visit. 

## 2022-03-24 NOTE — Patient Instructions (Signed)

## 2022-03-25 ENCOUNTER — Telehealth: Payer: Self-pay

## 2022-03-25 NOTE — Telephone Encounter (Signed)
  Follow up Call-     03/24/2022    2:18 PM  Call back number  Post procedure Call Back phone  # [phone] cell [place]  Permission to leave phone message Yes     Patient questions:  Do you have a fever, pain , or abdominal swelling? No. Pain Score  0 *  Have you tolerated food without any problems? Yes.    Have you been able to return to your normal activities? Yes.    Do you have any questions about your discharge instructions: Diet   No. Medications  No. Follow up visit  No.  Do you have questions or concerns about your Care? No.  Actions: * If pain score is 4 or above: No action needed, pain <4.

## 2022-03-26 ENCOUNTER — Other Ambulatory Visit: Payer: Self-pay | Admitting: Internal Medicine

## 2022-04-01 ENCOUNTER — Ambulatory Visit (INDEPENDENT_AMBULATORY_CARE_PROVIDER_SITE_OTHER): Payer: Medicare Other | Admitting: Internal Medicine

## 2022-04-01 ENCOUNTER — Encounter: Payer: Self-pay | Admitting: Internal Medicine

## 2022-04-01 VITALS — BP 132/76 | HR 72 | Temp 98.2°F | Ht 68.0 in | Wt 240.0 lb

## 2022-04-01 DIAGNOSIS — E559 Vitamin D deficiency, unspecified: Secondary | ICD-10-CM

## 2022-04-01 DIAGNOSIS — E785 Hyperlipidemia, unspecified: Secondary | ICD-10-CM

## 2022-04-01 DIAGNOSIS — E538 Deficiency of other specified B group vitamins: Secondary | ICD-10-CM | POA: Diagnosis not present

## 2022-04-01 DIAGNOSIS — I10 Essential (primary) hypertension: Secondary | ICD-10-CM

## 2022-04-01 DIAGNOSIS — E1151 Type 2 diabetes mellitus with diabetic peripheral angiopathy without gangrene: Secondary | ICD-10-CM | POA: Diagnosis not present

## 2022-04-01 DIAGNOSIS — Z794 Long term (current) use of insulin: Secondary | ICD-10-CM

## 2022-04-01 DIAGNOSIS — Z125 Encounter for screening for malignant neoplasm of prostate: Secondary | ICD-10-CM | POA: Diagnosis not present

## 2022-04-01 DIAGNOSIS — Z0001 Encounter for general adult medical examination with abnormal findings: Secondary | ICD-10-CM

## 2022-04-01 DIAGNOSIS — M79671 Pain in right foot: Secondary | ICD-10-CM

## 2022-04-01 LAB — LIPID PANEL
Cholesterol: 94 mg/dL (ref 0–200)
HDL: 28.4 mg/dL — ABNORMAL LOW (ref >39.00)
LDL Cholesterol: 27 mg/dL (ref 0–99)
NonHDL: 65.79
Total CHOL/HDL Ratio: 3
Triglycerides: 195 mg/dL — ABNORMAL HIGH (ref 0.0–149.0)
VLDL: 39 mg/dL (ref 0.0–40.0)

## 2022-04-01 LAB — URINALYSIS, ROUTINE W REFLEX MICROSCOPIC
Bilirubin Urine: NEGATIVE
Hgb urine dipstick: NEGATIVE
Ketones, ur: NEGATIVE
Leukocytes,Ua: NEGATIVE
Nitrite: NEGATIVE
RBC / HPF: NONE SEEN (ref 0–?)
Specific Gravity, Urine: 1.015 (ref 1.000–1.030)
Total Protein, Urine: NEGATIVE
Urine Glucose: >=1000 — AB
Urobilinogen, UA: 0.2 (ref 0.0–1.0)
pH: 5.5 (ref 5.0–8.0)

## 2022-04-01 LAB — CBC WITH DIFFERENTIAL/PLATELET
Basophils Absolute: 0.1 10*3/uL (ref 0.0–0.1)
Basophils Relative: 0.7 % (ref 0.0–3.0)
Eosinophils Absolute: 0.6 10*3/uL (ref 0.0–0.7)
Eosinophils Relative: 4.1 % (ref 0.0–5.0)
HCT: 44.8 % (ref 39.0–52.0)
Hemoglobin: 15.4 g/dL (ref 13.0–17.0)
Lymphocytes Relative: 19.9 % (ref 12.0–46.0)
Lymphs Abs: 2.8 10*3/uL (ref 0.7–4.0)
MCHC: 34.5 g/dL (ref 30.0–36.0)
MCV: 97.5 fl (ref 78.0–100.0)
Monocytes Absolute: 1 10*3/uL (ref 0.1–1.0)
Monocytes Relative: 7 % (ref 3.0–12.0)
Neutro Abs: 9.7 10*3/uL — ABNORMAL HIGH (ref 1.4–7.7)
Neutrophils Relative %: 68.3 % (ref 43.0–77.0)
Platelets: 250 10*3/uL (ref 150.0–400.0)
RBC: 4.6 Mil/uL (ref 4.22–5.81)
RDW: 13.2 % (ref 11.5–15.5)
WBC: 14.2 10*3/uL — ABNORMAL HIGH (ref 4.0–10.5)

## 2022-04-01 LAB — VITAMIN B12: Vitamin B-12: 311 pg/mL (ref 211–911)

## 2022-04-01 LAB — BASIC METABOLIC PANEL
BUN: 24 mg/dL — ABNORMAL HIGH (ref 6–23)
CO2: 25 mEq/L (ref 19–32)
Calcium: 9.3 mg/dL (ref 8.4–10.5)
Chloride: 98 mEq/L (ref 96–112)
Creatinine, Ser: 1.35 mg/dL (ref 0.40–1.50)
GFR: 55.11 mL/min — ABNORMAL LOW (ref >60.00)
Glucose, Bld: 312 mg/dL — ABNORMAL HIGH (ref 70–99)
Potassium: 4.5 mEq/L (ref 3.5–5.1)
Sodium: 134 mEq/L — ABNORMAL LOW (ref 135–145)

## 2022-04-01 LAB — HEPATIC FUNCTION PANEL
ALT: 28 U/L (ref 0–53)
AST: 19 U/L (ref 0–37)
Albumin: 4.1 g/dL (ref 3.5–5.2)
Alkaline Phosphatase: 73 U/L (ref 39–117)
Bilirubin, Direct: 0.1 mg/dL (ref 0.0–0.3)
Total Bilirubin: 0.5 mg/dL (ref 0.2–1.2)
Total Protein: 6.8 g/dL (ref 6.0–8.3)

## 2022-04-01 LAB — PSA: PSA: 2.04 ng/mL (ref 0.10–4.00)

## 2022-04-01 LAB — HEMOGLOBIN A1C: Hgb A1c MFr Bld: 8.7 % — ABNORMAL HIGH (ref 4.6–6.5)

## 2022-04-01 LAB — VITAMIN D 25 HYDROXY (VIT D DEFICIENCY, FRACTURES): VITD: 38.09 ng/mL (ref 30.00–100.00)

## 2022-04-01 LAB — TSH: TSH: 0.83 u[IU]/mL (ref 0.35–5.50)

## 2022-04-01 NOTE — Progress Notes (Signed)
Patient ID: [name], male   DOB: Dec 10, 1955, 66 y.o.   MRN: [id number]         Chief Complaint:: wellness exam and low vit d, dm, htn, hld       HPI:  [name] is a 66 y.o. male here for wellness exam; decliens flu shot, covid booster, shingrix pneumovax for now, o/w up to date                        Also Pt denies chest pain, increased sob or doe, wheezing, orthopnea, PND, [name], palpitations, [name], polyuria, [name], wt loss, night sweats, loss of appetite, [name], has new endo, [name], without swelling, redness, worse to walk, better to sit, no ulcers or fever.   Wt Readings from Last 3 Encounters:  04/01/22 240 lb (108.9 kg)  03/24/22 238 lb (108 kg)  03/12/22 238 lb 8 oz (108.2 kg)   BP Readings from Last 3 Encounters:  04/01/22 132/76  03/24/22 98/62  03/12/22 (!) 124/54   Immunization History  Administered Date(s) Administered   Fluad Quad(high Dose 65+) 04/21/2019   Influenza Split 07/07/2011, 06/08/2012   Influenza Whole 05/04/2008, 05/22/2009, 05/02/2010   Influenza,inj,Quad PF,6+ Mos 06/06/2013, 05/05/2014, 07/02/2015, 05/05/2016, 06/15/2018, 04/26/2020, 05/01/2021   PFIZER(Purple Top)SARS-COV-2 Vaccination 11/23/2019, 12/14/2019   Pneumococcal Polysaccharide-23 01/10/1996, 05/05/2016   Td 02/09/2000   Tdap 02/10/2011, 06/05/2016   Health Maintenance Due  Topic Date Due   Diabetic kidney evaluation - Urine ACR  10/25/2021      Past Medical History:  Diagnosis Date   Adenomatous colon polyp    Allergic rhinitis    At risk for sleep apnea    STOP-BANG= 5   SENT TO PCP 03-14-2014   Bladder cancer (HCC)    CAD (coronary artery disease)    a. 07/2014 low risk MV; b. 07/2019  Cor CTA (FFR): LM 25-49 (nl), LAD mild prox/mid plaque (nl), D1 25-49p(nl w/ abnl FFR of 0.73 in inf branch), LCX/OM1 mild prox/mid plaque (nl), RCA nondominant, minimal Ca2+ plaque (nl), RPDA (mildly abnl @ 0.79)-->Med Rx..   Cataract    surgically removed bilateral   Condyloma acuminatum of penis    Diabetic neuropathy ([place])    Diastolic dysfunction    a. 05/2019 Echo: EF 60-65%, no rwam, mod LVH, impaired relaxation, nl RV size/fxn, trace MR, triv TR.   Diverticulosis    GERD (gastroesophageal reflux disease)    History of bladder cancer    s/p  turbt  2013/   transitional cell carcinoma--    History of condyloma acuminatum    PERINEAL AREA  W/ RECURRENCY   History of gout    Hyperlipidemia    Hypertension    Lower urinary tract symptoms (LUTS)    PAF (paroxysmal atrial fibrillation) ([place])    a. 06/2019 Event monitor: PAF <1% burden. Longest 3 mins 36 secs.   Productive cough    PSVT (paroxysmal supraventricular tachycardia) ([place])    a. 06/2019 Event monitor: 112 episodes of SVT, longest 21 secs.   PVD (peripheral vascular disease) with claudication ([place])    a. 03/2014 LE art duplex:  long segment occlusion of mid to distal R SFA; b. 03/2020 ABI: nl left and mildly improved R ABI->med rx.   Renal artery stenosis (HCC)    Renal artery stenosis ([place])    a. 12/2020 Renal art duplex: RRA 1-59%, LRA >60%.   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  07-24-2014  dr Kathlyn [place]   Low risk [place] nuclear study with apical thinning and small inferolateral wall infarct at mid & basal level , no ischemia/  normal LVF and wall motion , ef 59%   CATARACT EXTRACTION Left    CATARACT EXTRACTION W/ INTRAOCULAR LENS IMPLANT Right    CO2 LASER APPLICATION N/A 8/92/1194   Procedure:  CO2 LASER APPLICATION,PENIS, GROIN, ANUS;  Surgeon: [name], MD;  Location: [place];  Service: General;  Laterality: N/A;   CO2 LASER APPLICATION N/A 17/40/8144   Procedure: CO2 LASER APPLICATION;  Surgeon: Kathie Rhodes, MD;  Location: [place];  Service: Urology;  Laterality: N/A;   CONDYLOMA EXCISION/FULGURATION N/A 05/21/2015   Procedure: CONDYLOMA REMOVAL;  Surgeon: Kathie Rhodes, MD;  Location[facility];  Service: Urology;  Laterality: N/A;   HEMORRHOID SURGERY  10/24/2014   Procedure: HEMORRHOIDECTOMY;  Surgeon: Michael Boston, MD;  Location: [facility];  Service: General;;   INCISION AND DRAINAGE ABSCESS Left 10/24/2014   Procedure: INCISION AND DRAINAGE ABSCESS;  Surgeon: Michael Boston, MD;  Location: [place];  Service: General;  Laterality: Left;   INGUINAL HIDRADENITIS [place] N/A 03/20/2014   Procedure: EXAM UNDER ANESTHESIA, REMOVAL/ABLATION OF CONDYLOMATA PENIS,GROINS, ANUS, ANAL CANAL;  Surgeon: [name], MD;  Location: [place];  Service: General;  Laterality: N/A;  groin and anus   LASER ABLATION CONDOLAMATA N/A 10/24/2014   Procedure: LASER ABLATION CONDOLAMATA;  Surgeon: Michael Boston, MD;  Location: [place];  Service: General;  Laterality: N/A;   LASER ABLATION OF PENILE AND PERIANAL WARTS  07-29-2007  Dr. 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 He has a 41.00 pack-year smoking history. He has never used smokeless tobacco. He reports that he does not drink alcohol and does not use drugs. family history includes Diabetes in his maternal aunt; Hypertension in his mother; Lung cancer in his father. Allergies  Allergen Reactions   Cefepime Hives    Had allergic reaction to one of these 3 agents, unclear which one   Metoprolol Hives    Had allergic reaction to one of these 3 agents, unclear which  one  *Per RN, highly likely this is the cause of allergic rxn   Myrbetriq [Mirabegron] Hives    Had allergic reaction to one of these 3 agents, unclear which one   Current Outpatient Medications on File Prior to Visit  Medication Sig Dispense Refill   aspirin EC 81 MG tablet Take 1 tablet (81 mg total) by mouth daily.     atorvastatin (LIPITOR) 40 MG tablet Take 1 tablet by mouth once daily Annual appt due in Sept must see provider for future refills 30 tablet 0   Blood Glucose Monitoring Suppl (ONE TOUCH ULTRA 2) w/Device KIT Use as directed to check blood sugars twice a day 1 kit 0   chlorthalidone (HYGROTON) 25 MG tablet TAKE 1 TABLET BY MOUTH DAILY 90 tablet 3   citalopram (CELEXA) 20 MG tablet Take 1 tablet (20 mg total) by mouth daily. 90 tablet 3   diltiazem (CARDIZEM CD) 360 MG 24 hr capsule Take 1 capsule (360 mg total) by mouth daily. 90 capsule 0   insulin glargine, 2 Unit Dial, (TOUJEO MAX SOLOSTAR) 300 UNIT/ML Solostar Pen Inject 240 Units into the skin every morning. 84 mL 3   irbesartan (AVAPRO) 300 MG tablet TAKE 1 TABLET BY MOUTH  DAILY 90 tablet 3   Lancets (ONETOUCH ULTRASOFT) lancets 1 each by Other route 2 (two) times daily. Dx E11.9 and Z79.4 180 each 3   loratadine (CLARITIN) 10 MG tablet Take 10 mg by mouth daily as needed.     ONETOUCH ULTRA test strip CHECK BLOOD SUGAR TWICE  DAILY 200 strip  3   pantoprazole (PROTONIX) 40 MG tablet TAKE 1 TABLET BY MOUTH  DAILY 90 tablet 0   Semaglutide,0.25 or 0.5MG/DOS, (OZEMPIC, 0.25 OR 0.5 MG/DOSE,) 2 MG/3ML SOPN Inject 0.25 mg into the skin once a week.     spironolactone (ALDACTONE) 25 MG tablet Take 1 tablet (25 mg total) by mouth daily. 90 tablet 3   VITAMIN D, CHOLECALCIFEROL, PO Take 5,000 Units by mouth daily.     No current facility-administered medications on file prior to visit.        ROS:  All others reviewed and negative.  Objective        PE:  BP 132/76 (BP Location: Left Arm, Patient Position: Sitting, Cuff Size: Large)   Pulse 72   Temp 98.2 F (36.8 C) (Oral)   Ht 5' 8"  (1.727 m)   Wt 240 lb (108.9 kg)   SpO2 95%   BMI 36.49 kg/m                 Constitutional: Pt appears in NAD               HENT: Head: NCAT.                Right Ear: External ear normal.                 Left Ear: External ear normal.                Eyes: . Pupils are equal, round, and reactive to light. Conjunctivae and EOM are normal               Nose: without d/c or deformity               Neck: Neck supple. Gross normal ROM               Cardiovascular: Normal rate and regular rhythm.  Pulmonary/Chest: Effort normal and breath sounds without rales or wheezing.                Abd:  Soft, NT, ND, + BS, no organomegaly               Neurological: Pt is alert. At baseline orientation, motor grossly intact               Skin: Skin is warm. No rashes, no other new lesions, LE edema - none               Psychiatric: Pt behavior is normal without agitation   Micro: none  Cardiac tracings I have personally interpreted today:  none  Pertinent Radiological findings (summarize): none   Lab Results  Component Value Date   WBC 12.9 (H) 10/25/2020   HGB 15.3 10/25/2020   HCT 43.8 10/25/2020   PLT 295.0 10/25/2020   GLUCOSE 81 08/21/2021   CHOL 102 05/01/2021   TRIG 264.0 (H) 05/01/2021   HDL 27.80 (L) 05/01/2021   LDLDIRECT  54.0 05/01/2021   LDLCALC 35 10/20/2019   ALT 34 05/01/2021   AST 29 05/01/2021   NA 133 (L) 08/21/2021   K 3.9 08/21/2021   CL 99 08/21/2021   CREATININE 1.27 (H) 08/21/2021   BUN 21 08/21/2021   CO2 25 08/21/2021   TSH 3.15 10/25/2020   PSA 1.76 10/25/2020   INR 1.1 06/04/2019   HGBA1C 9.5 (A) 02/13/2022   MICROALBUR 18.0 (H) 10/25/2020   Assessment/Plan:  [name] is a 67 y.o. White or Caucasian [1] male with  has a past medical history of Adenomatous colon polyp, Allergic rhinitis, At risk for sleep apnea, Bladder cancer ([place]), CAD (coronary artery disease), Cataract, Condyloma acuminatum of penis, Diabetic neuropathy ([place]), Diastolic dysfunction, Diverticulosis, GERD (gastroesophageal reflux disease), History of bladder cancer, History of condyloma acuminatum, History of gout, Hyperlipidemia, Hypertension, Lower urinary tract symptoms (LUTS), PAF (paroxysmal atrial fibrillation) ([place]), Productive cough, PSVT (paroxysmal supraventricular tachycardia) ([place]), PVD (peripheral vascular disease) with claudication ([place]), Renal artery stenosis ([place]), Renal artery stenosis ([place]), Smokers' cough ([place]), Type 2 diabetes mellitus with insulin therapy ([place]) (1992), and Wears dentures.  Vitamin D deficiency Last vitamin D Lab Results  Component Value Date   VD25OH 39.05 05/01/2021   Low, to start oral replacement   Encounter for well adult exam with abnormal findings Age and sex appropriate education and counseling updated with regular exercise and diet Referrals for preventative services - none needed Immunizations addressed - declines covid booster, flu shot, shingrix, pneumovax Smoking counseling  - none needed Evidence for depression or other mood disorder - none significant Most recent labs reviewed. I have personally reviewed and have noted: 1) the patient's medical and social history 2) The patient's current medications and supplements 3) The patient's height, weight, and BMI  have been recorded in the chart   Diabetes [facility]) Lab Results  Component Value Date   HGBA1C 9.5 (A) 02/13/2022   Uncontrolled recent,, pt to continue current medical treatment toujeo at 210 units and recent ozempic start, f/u endo as planned   Dyslipidemia Lab Results  Component Value Date   LDLCALC 35 10/20/2019   Stable, pt to continue current statin lipitor 40 mg qd   Essential hypertension BP Readings from Last 3 Encounters:  04/01/22 132/76  03/24/22 98/62  03/12/22 (!) 124/54   Stable, pt to continue medical treatment hct 25 mg qd, avapro 300 mg qd, aldactone 25 mg qd  Right foot pain Exam benign, appears neurovasc intact, etiology unclear, now for podiatry referral  Followup: Return in about 1 year (around 04/02/2023).  Cathlean Cower, MD 04/01/2022 8:40 PM [facility]

## 2022-04-01 NOTE — Assessment & Plan Note (Signed)
Age and sex appropriate education and counseling updated with regular exercise and diet Referrals for preventative services - none needed Immunizations addressed - declines covid booster, flu shot, shingrix, pneumovax Smoking counseling  - none needed Evidence for depression or other mood disorder - none significant Most recent labs reviewed. I have personally reviewed and have noted: 1) the patient's medical and social history 2) The patient's current medications and supplements 3) The patient's height, weight, and BMI have been recorded in the chart

## 2022-04-01 NOTE — Assessment & Plan Note (Signed)
Lab Results  Component Value Date   HGBA1C 9.5 (A) 02/13/2022   Uncontrolled recent,, pt to continue current medical treatment toujeo at 210 units and recent ozempic start, f/u endo as planned

## 2022-04-01 NOTE — Assessment & Plan Note (Signed)
Last vitamin D Lab Results  Component Value Date   VD25OH 39.05 05/01/2021   Low, to start oral replacement

## 2022-04-01 NOTE — Assessment & Plan Note (Signed)
BP Readings from Last 3 Encounters:  04/01/22 132/76  03/24/22 98/62  03/12/22 (!) 124/54   Stable, pt to continue medical treatment hct 25 mg qd, avapro 300 mg qd, aldactone 25 mg qd

## 2022-04-01 NOTE — Assessment & Plan Note (Signed)
Exam benign, appears neurovasc intact, etiology unclear, now for podiatry referral

## 2022-04-01 NOTE — Telephone Encounter (Signed)
Patient came in to pick up 15 boxes of Toujeo.

## 2022-04-01 NOTE — Patient Instructions (Signed)
Please continue all other medications as before, and refills have been done if requested.  Please have the pharmacy call with any other refills you may need.  Please continue your efforts at being more active, low cholesterol diet, and weight control.  You are otherwise up to date with prevention measures today.  Please keep your appointments with your specialists as you may have planned - endocrinology  You will be contacted regarding the referral for: podiatry  Please go to the LAB at the blood drawing area for the tests to be done  You will be contacted by phone if any changes need to be made immediately.  Otherwise, you will receive a letter about your results with an explanation, but please check with MyChart first.  Please remember to sign up for MyChart if you have not done so, as this will be important to you in the future with finding out test results, communicating by private email, and scheduling acute appointments online when needed.  Please make an Appointment to return for your 1 year visit, or sooner if needed

## 2022-04-01 NOTE — Assessment & Plan Note (Signed)
Lab Results  Component Value Date   LDLCALC 35 10/20/2019   Stable, pt to continue current statin lipitor 40 mg qd

## 2022-04-02 ENCOUNTER — Encounter: Payer: Self-pay | Admitting: Internal Medicine

## 2022-04-02 LAB — MICROALBUMIN / CREATININE URINE RATIO
Creatinine,U: 59.8 mg/dL
Microalb Creat Ratio: 3.5 mg/g (ref 0.0–30.0)
Microalb, Ur: 2.1 mg/dL — ABNORMAL HIGH (ref 0.0–1.9)

## 2022-04-04 NOTE — Telephone Encounter (Signed)
Spoke with pt and was able to inform him that his Ozempic is ready for pick-up.

## 2022-04-04 NOTE — Telephone Encounter (Signed)
Called and left message for patient to inform him that his Ozempic shipment is ready for pick up

## 2022-04-07 ENCOUNTER — Other Ambulatory Visit: Payer: Self-pay | Admitting: Internal Medicine

## 2022-04-07 NOTE — Telephone Encounter (Signed)
Please refill as per office routine med refill policy (all routine meds to be refilled for 3 mo or monthly (per pt preference) up to one year from last visit, then month to month grace period for 3 mo, then further med refills will have to be denied) ? ?

## 2022-04-08 NOTE — Telephone Encounter (Signed)
This denial appears to be an error, since pt was here Apr 01 2022 for his annual appt and has even updated labs  Please reconsider this error and refill as per office policy:  Please refill as per office routine med refill policy (all routine meds to be refilled for 3 mo or monthly (per pt preference) up to one year from last visit, then month to month grace period for 3 mo, then further med refills will have to be denied)

## 2022-04-08 NOTE — Progress Notes (Unsigned)
Cardiology Office Note:    Date:  04/09/2022   ID:  [name], [name] 05-15-1956, MRN [id number]  PCP:  Biagio Borg, MD   [facility] Cardiologist:  Kathlyn Sacramento, MD     Referring MD: Biagio Borg, MD   Chief Complaint  Patient presents with   Follow-up    6 month follow up. Had a little chest pain two to three days ago. Patient states that his foot sometimes becomes numb.  Meds reviewed with patient.     History of Present Illness:    [name] is a 66 y.o. male with a hx of the following:  Paroxysmal A-fib -this was noted on cardiac monitor in November 2020,  less than 1% of the time PSVT HTN PAD Nonobstructive CAD HLD Aortic atherosclerosis T2DM Bladder cancer 2013  L eye cancer Tobacco abuse - quit 2017   Previous cardiovascular work-up includes noninvasive evaluation of right calf claudication in August 2015, ABI was 0.44 on the right, normal ABI on the left.  Duplex showed occlusion of right SFA from distal to mid segment with reconstitution in the popliteal artery.  On the left there is no significant stenosis noted in the SFA, was medically managed.  Underwent stress test in November 2015, normal low risk study.  In October 2020 he presented to the hospital with hypertensive urgency, troponin elevated to 741, was also in the setting of urosepsis and pyelonephritis.  CT of the chest was negative for PE, but did note coronary calcium.  Echocardiogram revealed LVEF 60 to 65%, moderate LVH, no significant valvular abnormalities.  On cardiac telemetry, he was noted to have short runs of SVT due to electrolyte abnormalities.  He had further cardiac monitoring done after his discharge and monitor revealed 112 episodes of SVT, as well as paroxysmal A-fib with less than 1% burden.  He was switched to diltiazem from amlodipine, anticoagulation was not started due to low burden.  Coronary CTA was performed in December 2020-coronary calcium score of 412.   Coronary cath and angiography showed mild, nonobstructive disease with mildly abnormal FFR in the RPDA and inferior branch of the first diagonal.  Was recommended to medically manage.  Repeat ABIs in August 2021 showed right ABI of 0.54, normal left ABI, recommended to continue medical therapy.  Underwent renal artery duplex in May 2022, 1 to 59% stenosis in the right renal artery, suggestion of greater than 60% stenosis in the left renal artery however, was recommended to follow-up this with CTA or MRA imaging in the future if BP was not well controlled.  Switched to chlorthalidone from hydrochlorothiazide.  Was last seen in the office on August 21, 2021 by Ignacia Bayley, NP, was doing well from a cardiac perspective, BP well controlled.  Today he presents for 43-monthfollow-up appointment with a CC of aching, left-sided chest pain a couple of days ago, rated it a 4/10, lasted for 20 minutes, noticed at rest. Nothing made it worse and Tylenol helped. States it went into left shoulder and thought "it was his body's way of telling him a hurricane is coming." Hx of left crushed collar bone in 2004. Chronic DOE for the past 5-6 years, no worsening SHOB. Chronic right foot numbness. Minimal swelling in BLE. Denies palpitations, orthopnea, PND, hemoptysis, melena, hematuria, dizziness, syncope, presyncope, or claudication. Smokes cigars only; quit smoking cigarettes in 2017. Says he saw a provider who told him he has an abdominal hiatal hernia. Says BP is well  controlled. Denies any other questions or concerns.   Past Medical History:  Diagnosis Date   Adenomatous colon polyp    Allergic rhinitis    At risk for sleep apnea    STOP-BANG= 5   SENT TO PCP 03-14-2014   Bladder cancer (HCC)    CAD (coronary artery disease)    a. 07/2014 low risk MV; b. 07/2019 Cor CTA (FFR): LM 25-49 (nl), LAD mild prox/mid plaque (nl), D1 25-49p(nl w/ abnl FFR of 0.73 in inf branch), LCX/OM1 mild prox/mid plaque (nl), RCA  nondominant, minimal Ca2+ plaque (nl), RPDA (mildly abnl @ 0.79)-->Med Rx..   Cataract    surgically removed bilateral   Condyloma acuminatum of penis    Diabetic neuropathy ([place])    Diastolic dysfunction    a. 05/2019 Echo: EF 60-65%, no rwam, mod LVH, impaired relaxation, nl RV size/fxn, trace MR, triv TR.   Diverticulosis    GERD (gastroesophageal reflux disease)    History of bladder cancer    s/p  turbt  2013/   transitional cell carcinoma--    History of condyloma acuminatum    PERINEAL AREA  W/ RECURRENCY   History of gout    Hyperlipidemia    Hypertension    Lower urinary tract symptoms (LUTS)    PAF (paroxysmal atrial fibrillation) ([place])    a. 06/2019 Event monitor: PAF <1% burden. Longest 3 mins 36 secs.   Productive cough    PSVT (paroxysmal supraventricular tachycardia) ([place])    a. 06/2019 Event monitor: 112 episodes of SVT, longest 21 secs.   PVD (peripheral vascular disease) with claudication ([place])    a. 03/2014 LE art duplex: long segment occlusion of mid to distal R SFA; b. 03/2020 ABI: nl left and mildly improved R ABI->med rx.   Renal artery stenosis (HCC)    Renal artery stenosis ([place])    a. 12/2020 Renal art duplex: RRA 1-59%, LRA >60%.   Smokers' cough ([place])    Type 2 diabetes mellitus with insulin therapy ([place]) 1992   monitor by  dr ellsion   Wears dentures    upper    Past Surgical History:  Procedure Laterality Date   AXILLARY HIDRADENITIS [place] TEST  07-24-2014  dr Kathlyn Sacramento   Low risk [place] nuclear study with apical thinning and small inferolateral wall infarct at mid & basal level , no ischemia/  normal LVF and wall motion , ef 59%   CATARACT EXTRACTION Left    CATARACT EXTRACTION W/ INTRAOCULAR LENS IMPLANT Right    CO2 LASER APPLICATION N/A 5/73/2202   Procedure: CO2 LASER APPLICATION,PENIS, GROIN, ANUS;  Surgeon: [name], MD;  Location: [facility];  Service: General;  Laterality: N/A;    CO2 LASER APPLICATION N/A 54/27/0623   Procedure: CO2 LASER APPLICATION;  Surgeon: Kathie Rhodes, MD;  Location: [facility];  Service: Urology;  Laterality: N/A;   CONDYLOMA EXCISION/FULGURATION N/A 05/21/2015   Procedure: CONDYLOMA REMOVAL;  Surgeon: Kathie Rhodes, MD;  Location: [facility];  Service: Urology;  Laterality: N/A;   HEMORRHOID SURGERY  10/24/2014   Procedure: HEMORRHOIDECTOMY;  Surgeon: Michael Boston, MD;  Location: [facility];  Service: General;;   INCISION AND DRAINAGE ABSCESS Left 10/24/2014   Procedure: INCISION AND DRAINAGE ABSCESS;  Surgeon: Michael Boston, MD;  Location: [facility];  Service: General;  Laterality: Left;   INGUINAL HIDRADENITIS [place] N/A 03/20/2014  Procedure: EXAM UNDER ANESTHESIA, REMOVAL/ABLATION OF CONDYLOMATA PENIS,GROINS, ANUS, ANAL CANAL;  Surgeon: [name], MD;  Location: [facility];  Service: General;  Laterality: N/A;  groin and anus   LASER ABLATION CONDOLAMATA N/A 10/24/2014   Procedure: LASER ABLATION CONDOLAMATA;  Surgeon: Michael Boston, MD;  Location: [facility];  Service: General;  Laterality: N/A;   LASER ABLATION OF PENILE AND PERIANAL WARTS  07-29-2007  Dr. 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   atorvastatin (LIPITOR) 40 MG tablet Take 1 tablet by mouth once daily Annual appt due in Sept must see provider for future refills   Blood Glucose Monitoring Suppl (ONE TOUCH ULTRA 2) w/Device KIT Use as directed to check blood sugars twice a day   chlorthalidone (HYGROTON) 25 MG tablet TAKE 1 TABLET BY MOUTH DAILY   citalopram (CELEXA) 20 MG tablet Take 1 tablet (20 mg total) by mouth daily.   diltiazem (CARDIZEM CD) 360 MG 24 hr capsule Take 1 capsule (360 mg total) by mouth daily.   insulin glargine, 2 Unit Dial, (TOUJEO MAX SOLOSTAR) 300 UNIT/ML Solostar Pen Inject 240 Units into the skin every morning.   irbesartan (AVAPRO) 300 MG tablet TAKE 1 TABLET BY MOUTH  DAILY   Lancets (ONETOUCH ULTRASOFT) lancets 1 each by Other route 2 (two) times daily. Dx E11.9 and Z79.4   loratadine (CLARITIN) 10 MG tablet Take 10 mg by mouth daily as needed.   ONETOUCH ULTRA test strip CHECK BLOOD SUGAR TWICE  DAILY   pantoprazole (PROTONIX) 40 MG tablet TAKE 1 TABLET BY MOUTH  DAILY   Semaglutide,0.25 or 0.5MG/DOS, (OZEMPIC, 0.25 OR 0.5 MG/DOSE,) 2 MG/3ML SOPN Inject 0.25 mg into the skin once a week.   spironolactone (ALDACTONE) 25 MG tablet Take 1 tablet (25 mg total) by mouth daily.   VITAMIN D, CHOLECALCIFEROL, PO Take 5,000 Units by mouth daily.     Allergies:   Cefepime, Metoprolol, and Myrbetriq [mirabegron]   Social History   Socioeconomic History   Marital status: Married    Spouse name: Not on file   Number of children: 0   Years of education: Not on file   Highest education level: Not on file  Occupational History   Occupation: Disabled/retired  Tobacco Use   Smoking status: Every Day    Packs/day: 1.00    Years: 41.00    Total pack years: 41.00    Types: Cigars, Cigarettes    Last attempt to quit: 2017    Years since quitting: 6.6   Smokeless tobacco: Never   Tobacco comments:    Quit cigarettes in 2017 but started back smoking  Cigars  Vaping Use   Vaping Use: Former  Substance and Sexual Activity  Alcohol use: No    Alcohol/week: 0.0 standard drinks of alcohol   Drug use: No   Sexual activity: Not on file  Other Topics Concern   Not on file  Social History Narrative   Not on file   Social Determinants of Health   Financial Resource Strain: Low Risk  (08/23/2021)   Overall Financial Resource Strain (CARDIA)    Difficulty of Paying Living Expenses: Not hard at all  Food Insecurity: No Food Insecurity (08/23/2021)   Hunger Vital Sign    Worried About Running Out of Food in the Last Year: Never true    Ran Out of Food in the Last Year: Never true  Transportation Needs: No Transportation Needs (08/23/2021)   PRAPARE - Hydrologist (Medical): No    Lack of Transportation (Non-Medical): No  Physical Activity: Inactive (08/23/2021)   Exercise Vital Sign    Days of Exercise per Week: 0 days    Minutes of Exercise per Session: 0 min  Stress: No Stress Concern Present (08/23/2021)   [place]    Feeling of Stress : Not at all  Social Connections: Moderately Integrated (08/23/2021)   Social Connection and Isolation Panel [NHANES]    Frequency of Communication with Friends and Family: More than three times a week    Frequency of Social Gatherings with Friends and Family: Never    Attends Religious Services: Never    Marine scientist or Organizations: Yes    Attends Archivist Meetings: Never    Marital Status: Married     Family History: The patient's family history includes Diabetes in his maternal aunt; Hypertension in his mother; Lung cancer in his father. There is no history of Colon cancer, Esophageal cancer, Pancreatic cancer, Prostate cancer, Kidney disease, Liver disease, Rectal cancer, or Stomach cancer.  ROS:   Review of Systems  Constitutional: Negative.   HENT: Negative.    Eyes:  Negative.   Respiratory:  Positive for shortness of breath. Negative for cough, hemoptysis, sputum production and wheezing.        See HPI.  Cardiovascular:  Positive for chest pain and leg swelling. Negative for palpitations, orthopnea, claudication and PND.       See HPI.  Gastrointestinal: Negative.   Genitourinary: Negative.   Musculoskeletal: Negative.   Skin: Negative.   Neurological:  Negative for dizziness, tingling, tremors, sensory change, speech change, focal weakness, seizures, loss of consciousness, weakness and headaches.       Chronic right foot numbness.  Endo/Heme/Allergies: Negative.   Psychiatric/Behavioral: Negative.     Please see the history of present illness.    All other systems reviewed and are negative.  EKGs/Labs/Other Studies Reviewed:    The following studies were reviewed today:   EKG:  EKG is ordered today.  The ekg ordered today demonstrates NSR, 77 bpm, nonspecific ST segment changes.   Vascular ultrasound renal artery duplex on Dec 25, 2020: Right: Normal size right kidney. Abnormal right Resistive Index.         Normal cortical thickness of right kidney. 1-59% stenosis of         the right renal artery. RRV flow present. Incidental finding         of multiple gallstones seen. No gallbladder wall thickening         or increased CBD diameter.  Left:  Evidence of a > 60% stenosis in the left renal artery (low  end of range). LRV flow present. Normal size of left kidney.         Abnormal left Resisitve Index. Normal cortical thickness of         the left kidney.  Mesenteric: Areas of limited visceral study include mesenteric arteries.  Unable to evaluate celiac artery and SMA due to increased depth  limitations and patient body habitus.   Vascular ultrasound ABI with/without TBI on March 28, 2020:  Bilateral ABIs appear essentially unchanged compared to prior study on  04/05/14.    Summary:  Right: Resting right ankle-brachial index  indicates moderate right lower  extremity arterial disease. The right toe-brachial index is abnormal.   Left: Resting left ankle-brachial index is within normal range. No  evidence of significant left lower extremity arterial disease. The left  toe-brachial index is normal.   Coronary CTA on August 02, 2019: 1.  Coronary calcium score 412.  This was 85th percentile for his demographic. 2.  Normal coronary origin with left dominance. 3.  Mild (25 to 49%) stenosis throughout left main with high risk features (soft plaque; spotty calcification; positive remodeling); mild (25 to 49%) stenosis in D1. 4.  Study will be sent for FFR. 5.  Aortic atherosclerosis noted. 6.  FFR revealed findings suggesting significant stenosis and inferior branch of D1 and distal PDA; findings in LM and LAD suggesting no significant stenosis.  14-day cardiac monitor on June 28, 2019: Normal sinus rhythm, with average heart rate of 76 bpm.  112 SVT episodes, noted the longest lasted 21 seconds. Paroxysmal atrial fibrillation with a burden of less than 1%.  Longest episode lasted 3 minutes and 36 seconds with an average heart rate of 142 bpm, isolated PACs and PVCs.  2D echocardiogram on June 05, 2019:  1. Left ventricular ejection fraction, by visual estimation, is 60 to  65%. The left ventricle has normal function. Normal left ventricular size.  There is moderately increased left ventricular hypertrophy.   2. Elevated left ventricular end-diastolic pressure.   3. Left ventricular diastolic Doppler parameters are consistent with  impaired relaxation pattern of LV diastolic filling.   4. Global right ventricle has normal systolic function.The right  ventricular size is normal. No increase in right ventricular wall  thickness.   5. Left atrial size was normal.   6. Right atrial size was normal.   7. Mild mitral annular calcification.   8. The mitral valve is grossly normal. Trace mitral valve  regurgitation.   9. The tricuspid valve is grossly normal. Tricuspid valve regurgitation  is trivial.  10. The aortic valve is grossly normal Aortic valve regurgitation was not  visualized by color flow Doppler. Mild aortic valve sclerosis without  stenosis.  11. The pulmonic valve was grossly normal. Pulmonic valve regurgitation is  not visualized by color flow Doppler.  12. The interatrial septum was not well visualized.    Recent Labs: 04/01/2022: ALT 28; BUN 24; Creatinine, Ser 1.35; Hemoglobin 15.4; Platelets 250.0; Potassium 4.5; Sodium 134; TSH 0.83  Recent Lipid Panel    Component Value Date/Time   CHOL 94 04/01/2022 1407   CHOL 151 06/16/2019 1144   TRIG 195.0 (H) 04/01/2022 1407   HDL 28.40 (L) 04/01/2022 1407   HDL 28 (L) 06/16/2019 1144   CHOLHDL 3 04/01/2022 1407   VLDL 39.0 04/01/2022 1407   LDLCALC 27 04/01/2022 1407   LDLCALC 88 06/16/2019 1144   LDLDIRECT 54.0 05/01/2021 1525     Risk Assessment/Calculations:    CHA2DS2-VASc Score =  4  This indicates a 4.8% annual risk of stroke. The patient's score is based upon: CHF History: 0 HTN History: 1 Diabetes History: 1 Stroke History: 0 Vascular Disease History: 1 Age Score: 1 Gender Score: 0     Physical Exam:    VS:  BP 125/65 (BP Location: Right Arm, Patient Position: Sitting, Cuff Size: Large)   Pulse 77   Ht 5' 8" (1.727 m)   Wt 236 lb 12.8 oz (107.4 kg)   SpO2 95%   BMI 36.01 kg/m     Wt Readings from Last 3 Encounters:  04/09/22 236 lb 12.8 oz (107.4 kg)  04/01/22 240 lb (108.9 kg)  03/24/22 238 lb (108 kg)     GEN: Well nourished, well developed in no acute distress HEENT: Normal NECK: No JVD; No carotid bruits CARDIAC: RRR, no murmurs, rubs, gallops; 2+ peripheral pulses along radials, strong and equal bilaterally. 1+ peripheral pulses along PT, equal bilaterally RESPIRATORY:  Clear and diminished to auscultation without rales, wheezing or rhonchi  ABDOMEN: Soft, non-tender,  non-distended, large abdominal bulge in center of abdomen, noted while sitting up from supine position, most likely hiatal hernia. No abdominal bruits or other masses noted.  MUSCULOSKELETAL:  Trace, minimal nonpitting edema along BLE, otherwise normal; No deformity  SKIN: Warm and dry, skin discoloration along BLE and abrasion noted to RLE; otherwise nothing acute. NEUROLOGIC:  Alert and oriented x 3 PSYCHIATRIC:  Normal affect   ASSESSMENT:    1. Atypical chest pain   2. Coronary artery disease involving native heart without angina pectoris, unspecified vessel or lesion type   3. PAD (peripheral artery disease) ([place])   4. Hyperlipidemia, unspecified hyperlipidemia type   5. Hypertriglyceridemia   6. PAF (paroxysmal atrial fibrillation) ([place])   7. PSVT (paroxysmal supraventricular tachycardia) ([place])   8. Hypertension, unspecified type   9. Insulin dependent type 2 diabetes mellitus ([place])   10. Leukocytosis, unspecified type   11. Neutrophilia   12. Screening for AAA (abdominal aortic aneurysm)    PLAN:    In order of problems listed above:  Atypical chest pain - recent Recent atypical chest pain, does not sound ischemic in nature. Coronary CTA in 2020 revealed mild (25 to 49%) stenosis throughout left main with high risk features (soft plaque; spotty calcification; positive remodeling); mild (25 to 49%) stenosis in D1. Will initiate 15 mg of Imdur daily for his CAD. At this time, will defer further ischemic evaluation. If it recurs by the next OV, plan to discuss ischemic evaluation options. Discussed ED precautions.   2. CAD, Hyperlipidemia with LDL goal < 70, hypertriglyceridemia - chronic, PAD - chronic, stable See recent episode of CP noted above. Start Imdur 15 mg daily. LDL 27 on 04/01/22. Triglycerides 195. Initiate Fenofibrate 145 mg daily and recheck FLP and LFT's in 2 months. Continue ASA and Lipitor daily. Discussed ED precautions.   3. PAF, PSVT - stable Denies any  tachypalpitations. Continue Diltiazem 360 mg daily. CHADS2-VASc score is 4; however, pt is not a candidate for anticoagulation due to low burden of A-fib seen on previous monitor.   4. HTN - chronic, stable BP on arrival - 142/60, repeat manual 125/65. BP well controlled at home. Discussed to monitor BP at home at least 2 hours after medications and sitting for 5-10 minutes. Continue current medication regimen.   5. T2DM - chronic, stable Last HgbA1C 8.7%, not at goal. PCP and Endocrinology to manage.  6. Leukocytosis, neutrophilia - chronic Noted for the past  2 years. Recent CBC with diff on 04/01/22 revealed WBC 14.2, Neutrophils 9.7. Denies any fever, chills, or flu like symptoms. Will repeat CBC with diff, and if continued elevations, will refer to Hematology.   7. Screening for AAA  Has never been screened for AAA. With abdominal mass noted on exam, want to r/o any AAA. He has a prior history of smoking and is 66 years old. Will arrange Vascular US AAA duplex for AAA screening.   8. Disposition: F/U in 1 month with APP for CP and BP evaluation or sooner if anything changes.   Medication Adjustments/Labs and Tests Ordered: Current medicines are reviewed at length with the patient today.  Concerns regarding medicines are outlined above.  Orders Placed This Encounter  Procedures   Lipid panel   CBC w/Diff   Hepatic function panel   EKG 12-Lead   VAS [place] AAA DUPLEX   Meds ordered this encounter  Medications   isosorbide mononitrate (IMDUR) 30 MG 24 hr tablet    Sig: Take 0.5 tablets (15 mg total) by mouth daily.    Dispense:  45 tablet    Refill:  0   fenofibrate (TRICOR) 145 MG tablet    Sig: Take 1 tablet (145 mg total) by mouth daily.    Dispense:  30 tablet    Refill:  0    Patient Instructions  Medication Instructions:   START Fenofibrate 145 mg tablet by mouth daily. START Isosorbide 30 mg tablet- Take 1/2 (15 mg) tablet by mouth daily.  *If you need a refill on your  cardiac medications before your next appointment, please call your pharmacy*   Lab Work:  Your physician recommends that you return for lab work at [facility].  -CBC with Diff in 1 week.  -Lipid/ Hepatic in 2 months  - Must be fasting for Lipid lab work  Please Bring this paper work with you to lab appointment to make sure that they draw the correct labs on the correct dates.   Testing/Procedures:  Your physician has requested that you have an abdominal aorta duplex. During this test, an ultrasound is used to evaluate the aorta. Allow 30 minutes for this exam. Do not eat after midnight the day before and avoid carbonated beverages    Follow-Up: At [facility], you and your health needs are our priority.  As part of our continuing mission to provide you with exceptional heart care, we have created designated Provider Care Teams.  These Care Teams include your primary Cardiologist (physician) and Advanced Practice Providers (APPs -  Physician Assistants and Nurse Practitioners) who all work together to provide you with the care you need, when you need it.  We recommend signing up for the patient portal called "MyChart".  Sign up information is provided on this After Visit Summary.  MyChart is used to connect with patients for Virtual Visits (Telemedicine).  Patients are able to view lab/test results, encounter notes, upcoming appointments, etc.  Non-urgent messages can be sent to your provider as well.   To learn more about what you can do with MyChart, go to [email or web address].    Your next appointment:   1 month(s)  The format for your next appointment:   In Person  Provider:   You may see Kathlyn Sacramento, MD or one of the following Advanced Practice Providers on your designated Care Team:   Murray Hodgkins, NP Christell Faith, PA-C Cadence Kathlen Mody, PA-C Gerrie Nordmann, NP  Important Information About Sugar  Signed, Finis Bud, NP  04/09/2022 [place]

## 2022-04-09 ENCOUNTER — Ambulatory Visit: Payer: Medicare Other | Attending: Nurse Practitioner | Admitting: Nurse Practitioner

## 2022-04-09 ENCOUNTER — Other Ambulatory Visit: Payer: Self-pay | Admitting: Internal Medicine

## 2022-04-09 ENCOUNTER — Encounter: Payer: Self-pay | Admitting: Nurse Practitioner

## 2022-04-09 VITALS — BP 125/65 | HR 77 | Ht 68.0 in | Wt 236.8 lb

## 2022-04-09 DIAGNOSIS — D729 Disorder of white blood cells, unspecified: Secondary | ICD-10-CM

## 2022-04-09 DIAGNOSIS — D72829 Elevated white blood cell count, unspecified: Secondary | ICD-10-CM

## 2022-04-09 DIAGNOSIS — E119 Type 2 diabetes mellitus without complications: Secondary | ICD-10-CM

## 2022-04-09 DIAGNOSIS — Z136 Encounter for screening for cardiovascular disorders: Secondary | ICD-10-CM | POA: Diagnosis not present

## 2022-04-09 DIAGNOSIS — I480 Paroxysmal atrial fibrillation: Secondary | ICD-10-CM | POA: Diagnosis not present

## 2022-04-09 DIAGNOSIS — I10 Essential (primary) hypertension: Secondary | ICD-10-CM | POA: Diagnosis not present

## 2022-04-09 DIAGNOSIS — I2510 Atherosclerotic heart disease of native coronary artery without angina pectoris: Secondary | ICD-10-CM | POA: Diagnosis not present

## 2022-04-09 DIAGNOSIS — Z794 Long term (current) use of insulin: Secondary | ICD-10-CM

## 2022-04-09 DIAGNOSIS — E785 Hyperlipidemia, unspecified: Secondary | ICD-10-CM | POA: Diagnosis not present

## 2022-04-09 DIAGNOSIS — I739 Peripheral vascular disease, unspecified: Secondary | ICD-10-CM

## 2022-04-09 DIAGNOSIS — I471 Supraventricular tachycardia: Secondary | ICD-10-CM

## 2022-04-09 DIAGNOSIS — R0789 Other chest pain: Secondary | ICD-10-CM | POA: Diagnosis not present

## 2022-04-09 DIAGNOSIS — E781 Pure hyperglyceridemia: Secondary | ICD-10-CM

## 2022-04-09 MED ORDER — ISOSORBIDE MONONITRATE ER 30 MG PO TB24: 15.0000 mg | ORAL_TABLET | Freq: Every day | ORAL | 0 refills | Status: DC

## 2022-04-09 MED ORDER — FENOFIBRATE 145 MG PO TABS: 145.0000 mg | ORAL_TABLET | Freq: Every day | ORAL | 0 refills | Status: DC

## 2022-04-09 NOTE — Patient Instructions (Signed)
Medication Instructions:   START Fenofibrate 145 mg tablet by mouth daily. START Isosorbide 30 mg tablet- Take 1/2 (15 mg) tablet by mouth daily.  *If you need a refill on your cardiac medications before your next appointment, please call your pharmacy*   Lab Work:  Your physician recommends that you return for lab work at [facility].  -CBC with Diff in 1 week.  -Lipid/ Hepatic in 2 months  - Must be fasting for Lipid lab work  Please Bring this paper work with you to lab appointment to make sure that they draw the correct labs on the correct dates.   Testing/Procedures:  Your physician has requested that you have an abdominal aorta duplex. During this test, an ultrasound is used to evaluate the aorta. Allow 30 minutes for this exam. Do not eat after midnight the day before and avoid carbonated beverages    Follow-Up: At [facility], you and your health needs are our priority.  As part of our continuing mission to provide you with exceptional heart care, we have created designated Provider Care Teams.  These Care Teams include your primary Cardiologist (physician) and Advanced Practice Providers (APPs -  Physician Assistants and Nurse Practitioners) who all work together to provide you with the care you need, when you need it.  We recommend signing up for the patient portal called "MyChart".  Sign up information is provided on this After Visit Summary.  MyChart is used to connect with patients for Virtual Visits (Telemedicine).  Patients are able to view lab/test results, encounter notes, upcoming appointments, etc.  Non-urgent messages can be sent to your provider as well.   To learn more about what you can do with MyChart, go to [email or web address].    Your next appointment:   1 month(s)  The format for your next appointment:   In Person  Provider:   You may see Kathlyn Sacramento, MD or one of the following Advanced Practice Providers on your designated Care Team:    Murray Hodgkins, NP Christell Faith, PA-C Cadence Kathlen Mody, PA-C Gerrie Nordmann, NP  Important Information About Sugar

## 2022-04-09 NOTE — Telephone Encounter (Signed)
Please refill as per office routine med refill policy (all routine meds to be refilled for 3 mo or monthly (per pt preference) up to one year from last visit, then month to month grace period for 3 mo, then further med refills will have to be denied) ? ?

## 2022-04-10 ENCOUNTER — Ambulatory Visit (INDEPENDENT_AMBULATORY_CARE_PROVIDER_SITE_OTHER): Payer: Medicare Other

## 2022-04-10 ENCOUNTER — Ambulatory Visit: Payer: Medicare Other | Admitting: Podiatry

## 2022-04-10 ENCOUNTER — Other Ambulatory Visit: Payer: Self-pay | Admitting: Internal Medicine

## 2022-04-10 DIAGNOSIS — I999 Unspecified disorder of circulatory system: Secondary | ICD-10-CM | POA: Diagnosis not present

## 2022-04-10 DIAGNOSIS — M779 Enthesopathy, unspecified: Secondary | ICD-10-CM

## 2022-04-10 DIAGNOSIS — M7751 Other enthesopathy of right foot: Secondary | ICD-10-CM | POA: Diagnosis not present

## 2022-04-10 MED ORDER — TRIAMCINOLONE ACETONIDE 10 MG/ML IJ SUSP
10.0000 mg | Freq: Once | INTRAMUSCULAR | Status: AC
  Administered 2022-04-10: 10 mg

## 2022-04-10 NOTE — Progress Notes (Signed)
Subjective:   Patient ID: [name], male   DOB: 66 y.o.   MRN: [id number]   HPI Patient presents with right foot pain at the top and patient who is in relatively poor health.  He does smoke cigars does not currently smoke cigarettes and does have circulatory issues right side that is followed but has not had a stent done currently.  Patient's not active her   Review of Systems  All other systems reviewed and are negative.       Objective:  Physical Exam Vitals and nursing note reviewed.  Constitutional:      Appearance: He is well-developed.  Pulmonary:     Effort: Pulmonary effort is normal.  Musculoskeletal:        General: Normal range of motion.  Skin:    General: Skin is warm.  Neurological:     Mental Status: He is alert.     Vascular status indicates diminished pulses right over left with palpable DP pulses weak on the right stronger on the left and nonpalpable PT pulses.  Neurologically moderate diminishment sharp dull vibratory and patient is found to have significant elongation of nailbeds that become irritated in the corners 1-5 both feet and lesion distal right which can be sore at times.  He is in poor health overall does have good digital perfusion.  He is noted to have inflammation pain of the second and third metatarsal phalangeal joint right foot     Assessment:  Inflammatory capsulitis right forefoot circulatory status which may contribute to this and moderate nail disease     Plan:  8 NP reviewed all conditions and x-ray right that did not indicate arthritis.  I did do sterile prep I injected the MPJ second and third right 3 mg Dexasone Kenalog 5 mg Xylocaine advised on cushioned shoes discussed keeping up with his doctor who evaluates his circulatory status and that ultimately he may need a stent but at this point he has no breakdown of tissue and minimal claudication symptomatology

## 2022-04-10 NOTE — Telephone Encounter (Signed)
Pt last seen Apr 01 2022   Please refill as per office routine med refill policy (all routine meds to be refilled for 3 mo or monthly (per pt preference) up to one year from last visit, then month to month grace period for 3 mo, then further med refills will have to be denied)

## 2022-04-21 ENCOUNTER — Other Ambulatory Visit
Admission: RE | Admit: 2022-04-21 | Discharge: 2022-04-21 | Disposition: A | Discharge status: Home / Self Care | Payer: Medicare Other | Attending: Nurse Practitioner | Admitting: Nurse Practitioner

## 2022-04-21 DIAGNOSIS — E119 Type 2 diabetes mellitus without complications: Secondary | ICD-10-CM | POA: Insufficient documentation

## 2022-04-21 DIAGNOSIS — E785 Hyperlipidemia, unspecified: Secondary | ICD-10-CM | POA: Insufficient documentation

## 2022-04-21 DIAGNOSIS — I739 Peripheral vascular disease, unspecified: Secondary | ICD-10-CM | POA: Insufficient documentation

## 2022-04-21 DIAGNOSIS — I471 Supraventricular tachycardia: Secondary | ICD-10-CM | POA: Diagnosis not present

## 2022-04-21 DIAGNOSIS — D72829 Elevated white blood cell count, unspecified: Secondary | ICD-10-CM | POA: Insufficient documentation

## 2022-04-21 DIAGNOSIS — Z794 Long term (current) use of insulin: Secondary | ICD-10-CM | POA: Diagnosis not present

## 2022-04-21 DIAGNOSIS — I10 Essential (primary) hypertension: Secondary | ICD-10-CM | POA: Diagnosis not present

## 2022-04-21 DIAGNOSIS — I2510 Atherosclerotic heart disease of native coronary artery without angina pectoris: Secondary | ICD-10-CM | POA: Diagnosis not present

## 2022-04-21 LAB — CBC WITH DIFFERENTIAL/PLATELET
Abs Immature Granulocytes: 0.09 10*3/uL — ABNORMAL HIGH (ref 0.00–0.07)
Basophils Absolute: 0.2 10*3/uL — ABNORMAL HIGH (ref 0.0–0.1)
Basophils Relative: 1 %
Eosinophils Absolute: 0.5 10*3/uL (ref 0.0–0.5)
Eosinophils Relative: 3 %
HCT: 52.2 % — ABNORMAL HIGH (ref 39.0–52.0)
Hemoglobin: 18 g/dL — ABNORMAL HIGH (ref 13.0–17.0)
Immature Granulocytes: 1 %
Lymphocytes Relative: 20 %
Lymphs Abs: 3 10*3/uL (ref 0.7–4.0)
MCH: 32.7 pg (ref 26.0–34.0)
MCHC: 34.5 g/dL (ref 30.0–36.0)
MCV: 94.9 fL (ref 80.0–100.0)
Monocytes Absolute: 1.4 10*3/uL — ABNORMAL HIGH (ref 0.1–1.0)
Monocytes Relative: 9 %
Neutro Abs: 10 10*3/uL — ABNORMAL HIGH (ref 1.7–7.7)
Neutrophils Relative %: 66 %
Platelets: 310 10*3/uL (ref 150–400)
RBC: 5.5 MIL/uL (ref 4.22–5.81)
RDW: 12.6 % (ref 11.5–15.5)
WBC: 15.2 10*3/uL — ABNORMAL HIGH (ref 4.0–10.5)
nRBC: 0 % (ref 0.0–0.2)

## 2022-04-21 LAB — HEPATIC FUNCTION PANEL
ALT: 31 U/L (ref 0–44)
AST: 22 U/L (ref 15–41)
Albumin: 4.2 g/dL (ref 3.5–5.0)
Alkaline Phosphatase: 58 U/L (ref 38–126)
Bilirubin, Direct: 0.1 mg/dL (ref 0.0–0.2)
Indirect Bilirubin: 0.7 mg/dL (ref 0.3–0.9)
Total Bilirubin: 0.8 mg/dL (ref 0.3–1.2)
Total Protein: 8 g/dL (ref 6.5–8.1)

## 2022-04-21 LAB — LIPID PANEL
Cholesterol: 116 mg/dL (ref 0–200)
HDL: 30 mg/dL — ABNORMAL LOW (ref >40)
LDL Cholesterol: 62 mg/dL (ref 0–99)
Total CHOL/HDL Ratio: 3.9 RATIO
Triglycerides: 118 mg/dL (ref <150)
VLDL: 24 mg/dL (ref 0–40)

## 2022-04-28 ENCOUNTER — Telehealth: Payer: Self-pay | Admitting: Cardiovascular Disease

## 2022-04-28 NOTE — Telephone Encounter (Signed)
Finis Bud, NP  04/21/2022 12:10 PM EDT Back to Top    Normal hepatic function panel and much improved triglycerides and VLDL. Will continue current medication regimen. HDL is low but has improved. He should continue a heart healthy diet and moderate activity for 150 minutes per week as much as tolerated to help improve this level.    Thanks!   Finis Bud, AGNP-C   Spoke with the patient and reviewed lab results and Elizabeth's recommendation. Patient verbalized understanding and voiced appreciation for the call back.

## 2022-04-28 NOTE — Telephone Encounter (Signed)
Patient is requesting a call back to discuss lab results. 

## 2022-04-28 NOTE — Telephone Encounter (Signed)
Patient's wife is calling about his test results.

## 2022-05-09 ENCOUNTER — Other Ambulatory Visit: Payer: Self-pay | Admitting: Nurse Practitioner

## 2022-05-10 ENCOUNTER — Other Ambulatory Visit: Payer: Self-pay | Admitting: Internal Medicine

## 2022-05-10 NOTE — Telephone Encounter (Signed)
Please refill as per office routine med refill policy (all routine meds to be refilled for 3 mo or monthly (per pt preference) up to one year from last visit, then month to month grace period for 3 mo, then further med refills will have to be denied) ? ?

## 2022-05-15 ENCOUNTER — Ambulatory Visit: Payer: Medicare Other

## 2022-05-16 ENCOUNTER — Ambulatory Visit: Payer: Medicare Other | Admitting: Nurse Practitioner

## 2022-05-22 DIAGNOSIS — Z8551 Personal history of malignant neoplasm of bladder: Secondary | ICD-10-CM | POA: Diagnosis not present

## 2022-05-22 DIAGNOSIS — C672 Malignant neoplasm of lateral wall of bladder: Secondary | ICD-10-CM | POA: Diagnosis not present

## 2022-06-04 ENCOUNTER — Other Ambulatory Visit: Payer: Self-pay | Admitting: Nurse Practitioner

## 2022-06-04 ENCOUNTER — Telehealth: Payer: Self-pay

## 2022-06-04 NOTE — Telephone Encounter (Signed)
Mailed Novo Nordisk renewal application to patient home. 

## 2022-06-05 ENCOUNTER — Telehealth: Payer: Self-pay | Admitting: Cardiovascular Disease

## 2022-06-05 NOTE — Telephone Encounter (Signed)
Please schedule overdue F/U appointment. Patient did not show for last scheduled office visit. Thank you! 

## 2022-06-05 NOTE — Telephone Encounter (Signed)
Pt returning a call from [place].

## 2022-06-05 NOTE — Telephone Encounter (Signed)
Rx refill sent to pharmacy. 

## 2022-06-06 ENCOUNTER — Telehealth: Payer: Self-pay

## 2022-06-06 NOTE — Telephone Encounter (Signed)
Mailed SANO[employer] renewal application to patient home.  Juliocesar Blasius L. CPhT Rx Patient Advocate   

## 2022-06-25 ENCOUNTER — Other Ambulatory Visit: Payer: Self-pay | Admitting: Internal Medicine

## 2022-06-25 NOTE — Telephone Encounter (Signed)
Please refill as per office routine med refill policy (all routine meds to be refilled for 3 mo or monthly (per pt preference) up to one year from last visit, then month to month grace period for 3 mo, then further med refills will have to be denied) ? ?

## 2022-06-27 NOTE — Telephone Encounter (Signed)
error 

## 2022-06-29 ENCOUNTER — Other Ambulatory Visit: Payer: Self-pay | Admitting: Nurse Practitioner

## 2022-06-30 NOTE — Telephone Encounter (Signed)
Needs F/U appt for refill. Newly started medication. Please reschedule-patient cancelled last office visit. Thank you!

## 2022-07-02 ENCOUNTER — Encounter: Payer: Self-pay | Admitting: Internal Medicine

## 2022-07-02 ENCOUNTER — Telehealth: Payer: Self-pay | Admitting: Internal Medicine

## 2022-07-02 ENCOUNTER — Ambulatory Visit: Payer: Medicare Other | Admitting: Internal Medicine

## 2022-07-02 VITALS — BP 120/80 | HR 72 | Ht 68.0 in | Wt 238.0 lb

## 2022-07-02 DIAGNOSIS — E1142 Type 2 diabetes mellitus with diabetic polyneuropathy: Secondary | ICD-10-CM

## 2022-07-02 DIAGNOSIS — Z794 Long term (current) use of insulin: Secondary | ICD-10-CM | POA: Diagnosis not present

## 2022-07-02 DIAGNOSIS — E1151 Type 2 diabetes mellitus with diabetic peripheral angiopathy without gangrene: Secondary | ICD-10-CM | POA: Diagnosis not present

## 2022-07-02 LAB — POCT GLYCOSYLATED HEMOGLOBIN (HGB A1C): Hemoglobin A1C: 7.5 % — AB (ref 4.0–5.6)

## 2022-07-02 LAB — POCT GLUCOSE (DEVICE FOR HOME USE): POC Glucose: 71 mg/dl (ref 70–99)

## 2022-07-02 MED ORDER — OZEMPIC (0.25 OR 0.5 MG/DOSE) 2 MG/3ML ~~LOC~~ SOPN: 0.5000 mg | PEN_INJECTOR | SUBCUTANEOUS | 3 refills | Status: DC

## 2022-07-02 MED ORDER — TOUJEO MAX SOLOSTAR 300 UNIT/ML ~~LOC~~ SOPN: 150.0000 [IU] | PEN_INJECTOR | SUBCUTANEOUS | 3 refills | Status: DC

## 2022-07-02 NOTE — Patient Instructions (Signed)
Decrease Toujeo 150 units daily  Continue Ozempic 0.5 mg weekly     HOW TO TREAT LOW BLOOD SUGARS (Blood sugar LESS THAN 70 MG/DL) Please follow the RULE OF 15 for the treatment of hypoglycemia treatment (when your (blood sugars are less than 70 mg/dL)   STEP 1: Take 15 grams of carbohydrates when your blood sugar is low, which includes:  3-4 GLUCOSE TABS  OR 3-4 OZ OF JUICE OR REGULAR SODA OR ONE TUBE OF GLUCOSE GEL    STEP 2: RECHECK blood sugar in 15 MINUTES STEP 3: If your blood sugar is still low at the 15 minute recheck --> then, go back to STEP 1 and treat AGAIN with another 15 grams of carbohydrates.

## 2022-07-02 NOTE — Telephone Encounter (Signed)
Patient completed 2 forms:      1) [employer] Patient Writer       2) [employer] Patient Electrical engineer.  Both forms are in Dr. Quin Hoop bin in front office.  Patient states that the financial records that need to be attached to each application is already in the system (in his record).

## 2022-07-02 NOTE — Progress Notes (Signed)
Name: DEMETRUS [name]  Age/ Sex: 66 y.o., male   MRN/ DOB: [id number], [birth year]     PCP: Biagio Borg, MD   Reason for Endocrinology Evaluation: Type 2 Diabetes Mellitus  Initial Endocrine Consultative Visit: 09/06/2012    PATIENT IDENTIFIER: [name] is a 66 y.o. male with a past medical history of T2DM, HTN and Hx of bladder cancer and CAD. The patient has followed with Endocrinology clinic since 09/06/2012 for consultative assistance with management of his diabetes.  DIABETIC HISTORY:  Mr. Eisenberg was diagnosed with DM 1992.  He used to be on Metformin- diarrhea. His hemoglobin A1c has ranged from 6.9% in 2013, peaking at 10.9% in 2021  He attributes bladder cancer to actos    SUBJECTIVE:   During the last visit (02/13/2022): A1c 9.5%  Today (07/02/2022): Mr. [name]  is here for a follow up on diabetes management .He checks his blood sugars 1 times daily. The patient has had hypoglycemic episodes since the last clinic visit. The patient is  symptomatic with these episodes   He is on pt assistance with Toujeo and Ozempic   Denies nausea, vomiting but has occasional diarrhea     HOME DIABETES REGIMEN:  Toujeo 210 units daily  Ozempic 0.5 mg weekly ( Monday )      Statin: yes ACE-I/ARB: yes    GLUCOSE LOG: 56- 159 mg/dL    DIABETIC COMPLICATIONS: Microvascular complications:   Denies: CKD  Last Eye Exam: Completed 11/06/2021  Macrovascular complications:  CAD Denies: CVA, PVD   HISTORY:  Past Medical History:  Past Medical History:  Diagnosis Date   Adenomatous colon polyp    Allergic rhinitis    At risk for sleep apnea    STOP-BANG= 5   SENT TO PCP 03-14-2014   Bladder cancer ([place])    CAD (coronary artery disease)    a. 07/2014 low risk MV; b. 07/2019 Cor CTA (FFR): LM 25-49 (nl), LAD mild prox/mid plaque (nl), D1 25-49p(nl w/ abnl FFR of 0.73 in inf branch), LCX/OM1 mild prox/mid plaque (nl), RCA nondominant, minimal Ca2+ plaque (nl), RPDA (mildly  abnl @ 0.79)-->Med Rx..   Cataract    surgically removed bilateral   Condyloma acuminatum of penis    Diabetic neuropathy ([place])    Diastolic dysfunction    a. 05/2019 Echo: EF 60-65%, no rwam, mod LVH, impaired relaxation, nl RV size/fxn, trace MR, triv TR.   Diverticulosis    GERD (gastroesophageal reflux disease)    History of bladder cancer    s/p  turbt  2013/   transitional cell carcinoma--    History of condyloma acuminatum    PERINEAL AREA  W/ RECURRENCY   History of gout    Hyperlipidemia    Hypertension    Lower urinary tract symptoms (LUTS)    PAF (paroxysmal atrial fibrillation) ([place])    a. 06/2019 Event monitor: PAF <1% burden. Longest 3 mins 36 secs.   Productive cough    PSVT (paroxysmal supraventricular tachycardia)    a. 06/2019 Event monitor: 112 episodes of SVT, longest 21 secs.   PVD (peripheral vascular disease) with claudication ([place])    a. 03/2014 LE art duplex: long segment occlusion of mid to distal R SFA; b. 03/2020 ABI: nl left and mildly improved R ABI->med rx.   Renal artery stenosis ([facility])    Renal artery stenosis ([place])    a. 12/2020 Renal art duplex: RRA 1-59%, LRA >60%.   Smokers' cough ([place])    Type  2 diabetes mellitus with insulin therapy ([place]) 1992   monitor by  dr ellsion   Wears dentures    upper   Past Surgical History:  Past Surgical History:  Procedure Laterality Date   AXILLARY HIDRADENITIS EXCISION  1997   CARDIOVASCULAR STRESS TEST  07-24-2014  dr Kathlyn Sacramento   Low risk [place] nuclear study with apical thinning and small inferolateral wall infarct at mid & basal level , no ischemia/  normal LVF and wall motion , ef 59%   CATARACT EXTRACTION Left    CATARACT EXTRACTION W/ INTRAOCULAR LENS IMPLANT Right    CO2 LASER APPLICATION N/A 5/37/4827   Procedure: CO2 LASER APPLICATION,PENIS, GROIN, ANUS;  Surgeon: [name], MD;  Location: [facility];  Service: General;  Laterality: N/A;   CO2 LASER APPLICATION N/A  07/86/7544   Procedure: CO2 LASER APPLICATION;  Surgeon: Kathie Rhodes, MD;  Location: [place];  Service: Urology;  Laterality: N/A;   CONDYLOMA EXCISION/FULGURATION N/A 05/21/2015   Procedure: CONDYLOMA REMOVAL;  Surgeon: Kathie Rhodes, MD;  Location: [facility];  Service: Urology;  Laterality: N/A;   HEMORRHOID SURGERY  10/24/2014   Procedure: HEMORRHOIDECTOMY;  Surgeon: Michael Boston, MD;  Location: [facility];  Service: General;;   INCISION AND DRAINAGE ABSCESS Left 10/24/2014   Procedure: INCISION AND DRAINAGE ABSCESS;  Surgeon: Michael Boston, MD;  Location: [place];  Service: General;  Laterality: Left;   INGUINAL HIDRADENITIS [place] N/A 03/20/2014   Procedure: EXAM UNDER ANESTHESIA, REMOVAL/ABLATION OF CONDYLOMATA PENIS,GROINS, ANUS, ANAL CANAL;  Surgeon: [name], MD;  Location: [place];  Service: General;  Laterality: N/A;  groin and anus   LASER ABLATION CONDOLAMATA N/A 10/24/2014   Procedure: LASER ABLATION CONDOLAMATA;  Surgeon: Michael Boston, MD;  Location: [place];  Service: General;  Laterality: N/A;   LASER ABLATION OF PENILE AND PERIANAL WARTS  07-29-2007  Dr. Johney Maine   LEFT SHOULDER SURGERY  2003   MASS EXCISION N/A 10/24/2014   Procedure: EXCISION OF PERINEAL MASS/SINUS;  Surgeon: Michael Boston, MD;  Location: [place];  Service: General;  Laterality: N/A;   MOHS SURGERY     back   MOHS SURGERY  2017   face   MULTIPLE TOOTH EXTRACTIONS     PERINEAL HIDRADENITIS [place] TUMOR  05/21/2012   Procedure: TRANSURETHRAL RESECTION OF BLADDER TUMOR (TURBT);  Surgeon: Claybon Jabs, MD;  Location: [facility];  Service: Urology;  Laterality: N/A;      Social History:  reports that he has been smoking cigars and cigarettes. He has a 41.00 pack-year smoking  history. He has never used smokeless tobacco. He reports that he does not drink alcohol and does not use drugs. Family History:  Family History  Problem Relation Age of Onset   Hypertension Mother    Lung cancer Father    Diabetes Maternal Aunt        x 2   Colon cancer Neg Hx    Esophageal cancer Neg Hx    Pancreatic cancer Neg Hx    Prostate cancer Neg Hx    Kidney disease Neg Hx    Liver disease Neg Hx    Rectal cancer Neg Hx    Stomach cancer Neg Hx      HOME MEDICATIONS: Allergies as of 07/02/2022  Reactions   Cefepime Hives   Had allergic reaction to one of these 3 agents, unclear which one   Metoprolol Hives   Had allergic reaction to one of these 3 agents, unclear which one *Per RN, highly likely this is the cause of allergic rxn   Myrbetriq [mirabegron] Hives   Had allergic reaction to one of these 3 agents, unclear which one        Medication List        Accurate as of July 02, 2022  1:50 PM. If you have any questions, ask your nurse or doctor.          aspirin EC 81 MG tablet Take 1 tablet (81 mg total) by mouth daily.   atorvastatin 40 MG tablet Commonly known as: LIPITOR TAKE 1 TABLET BY MOUTH ONCE DAILY   chlorthalidone 25 MG tablet Commonly known as: HYGROTON TAKE 1 TABLET BY MOUTH DAILY   citalopram 20 MG tablet Commonly known as: CELEXA Take 1 tablet (20 mg total) by mouth daily.   diltiazem 360 MG 24 hr capsule Commonly known as: CARDIZEM CD Take 1 capsule (360 mg total) by mouth daily.   fenofibrate 145 MG tablet Commonly known as: TRICOR Take 1 tablet by mouth once daily   irbesartan 300 MG tablet Commonly known as: AVAPRO TAKE 1 TABLET BY MOUTH  DAILY   isosorbide mononitrate 30 MG 24 hr tablet Commonly known as: IMDUR Take 1/2 (one-half) tablet by mouth once daily   loratadine 10 MG tablet Commonly known as: CLARITIN Take 10 mg by mouth daily as needed.   ONE TOUCH ULTRA 2 w/Device Kit Use as directed to  check blood sugars twice a day   OneTouch Ultra test strip Generic drug: glucose blood CHECK BLOOD SUGAR TWICE  DAILY   onetouch ultrasoft lancets 1 each by Other route 2 (two) times daily. Dx E11.9 and Z79.4   Ozempic (0.25 or 0.5 MG/DOSE) 2 MG/3ML Sopn Generic drug: Semaglutide(0.25 or 0.5MG/DOS) Inject 0.25 mg into the skin once a week.   pantoprazole 40 MG tablet Commonly known as: PROTONIX TAKE 1 TABLET BY MOUTH DAILY   spironolactone 25 MG tablet Commonly known as: ALDACTONE Take 1 tablet (25 mg total) by mouth daily.   Toujeo Max SoloStar 300 UNIT/ML Solostar Pen Generic drug: insulin glargine (2 Unit Dial) Inject 240 Units into the skin every morning. What changed: how much to take   VITAMIN D (CHOLECALCIFEROL) PO Take 5,000 Units by mouth daily.         OBJECTIVE:   Vital Signs: BP 120/80 (BP Location: Left Arm, Patient Position: Sitting, Cuff Size: Large)   Pulse 72   Ht 5' 8" (1.727 m)   Wt 238 lb (108 kg)   SpO2 94%   BMI 36.19 kg/m   Wt Readings from Last 3 Encounters:  07/02/22 238 lb (108 kg)  04/09/22 236 lb 12.8 oz (107.4 kg)  04/01/22 240 lb (108.9 kg)     Exam: General: Pt appears well and is in NAD  Neck: General: Supple without adenopathy. Thyroid: Thyroid size normal.  No goiter or nodules appreciated.  Lungs: Clear with good BS bilat with no rales, rhonchi, or wheezes  Heart: RRR  Extremities: No pretibial edema.   Neuro: MS is good with appropriate affect, pt is alert and Ox3    DM Foot Exam 07/02/2022  The skin of the feet is without sores or ulcerations but has callus formation  The pedal pulses are undetectable The sensation  is absent  to a screening 5.07, 10 gram monofilament bilaterally   DATA REVIEWED:  Lab Results  Component Value Date   HGBA1C 7.5 (A) 07/02/2022   HGBA1C 8.7 (H) 04/01/2022   HGBA1C 9.5 (A) 02/13/2022   Lab Results  Component Value Date   MICROALBUR 2.1 (H) 04/01/2022   LDLCALC 62 04/21/2022    CREATININE 1.35 04/01/2022   Lab Results  Component Value Date   MICRALBCREAT 3.5 04/01/2022     Lab Results  Component Value Date   CHOL 116 04/21/2022   HDL 30 (L) 04/21/2022   LDLCALC 62 04/21/2022   LDLDIRECT 54.0 05/01/2021   TRIG 118 04/21/2022   CHOLHDL 3.9 04/21/2022       Old records , labs and images have been reviewed.    ASSESSMENT / PLAN / RECOMMENDATIONS:   1) Type 2 Diabetes Mellitus, Sub-optimally  controlled, With macrovascular  complications - Most recent A1c of 7.5 %. Goal A1c < 7.0 %.    -His A1c has trended down from  8.7% to 7.5%  -He is intolerant to Victoza 1.8 mg -He receives his Ozempic and Toujeo through patient assistance -Intolerant to metformin -Patient attributes bladder cancer to pioglitazone use -Not a candidate for SGLT2 inhibitors at this time due to history of bladder cancer and increased risk of infection - Will reduce insulin due to hypoglycemia   MEDICATIONS: Decrease Toujeo to 150 units daily Continue Ozempic 0.5 mg weekly  EDUCATION / INSTRUCTIONS: BG monitoring instructions: Patient is instructed to check his blood sugars 1 times a day, fasting. Call [facility] if: BG persistently < 70 I reviewed the Rule of 15 for the treatment of hypoglycemia in detail with the patient. Literature supplied.   2) Diabetic complications:  Eye: Does not have known diabetic retinopathy.  Neuro/ Feet: Does not have known diabetic peripheral neuropathy .  Renal: Patient does not have known baseline CKD. He   is  on an ACEI/ARB at present.      F/U in 6 months     Signed electronically by: Mack Guise, MD  [facility] End[facility]in[facility]  [facility] [address] [place], [place] [address] Phone: [phone] FAX: [phone]   CC: Biagio Borg, [place] [address] Phone: [phone]  Fax: [phone]  Return to Endocrinology clinic as below: Future  Appointments  Date Time Provider [place]  07/23/2022  7:30 AM [facility] [place] 1 [facility] None

## 2022-07-04 ENCOUNTER — Other Ambulatory Visit: Payer: Self-pay | Admitting: Internal Medicine

## 2022-07-04 NOTE — Telephone Encounter (Signed)
Please refill as per office routine med refill policy (all routine meds to be refilled for 3 mo or monthly (per pt preference) up to one year from last visit, then month to month grace period for 3 mo, then further med refills will have to be denied) ? ?

## 2022-07-07 ENCOUNTER — Other Ambulatory Visit: Payer: Self-pay | Admitting: Internal Medicine

## 2022-07-07 NOTE — Telephone Encounter (Signed)
Please refill as per office routine med refill policy (all routine meds to be refilled for 3 mo or monthly (per pt preference) up to one year from last visit, then month to month grace period for 3 mo, then further med refills will have to be denied) ? ?

## 2022-07-07 NOTE — Telephone Encounter (Signed)
Patient brought in his financial records today.  They are in Dr. Quin Hoop bin in the front office.

## 2022-07-08 ENCOUNTER — Other Ambulatory Visit: Payer: Self-pay | Admitting: Nurse Practitioner

## 2022-07-08 ENCOUNTER — Other Ambulatory Visit: Payer: Self-pay | Admitting: Internal Medicine

## 2022-07-08 NOTE — Telephone Encounter (Signed)
Please refill as per office routine med refill policy (all routine meds to be refilled for 3 mo or monthly (per pt preference) up to one year from last visit, then month to month grace period for 3 mo, then further med refills will have to be denied) ? ?

## 2022-07-10 ENCOUNTER — Other Ambulatory Visit: Payer: Self-pay | Admitting: Internal Medicine

## 2022-07-10 NOTE — Telephone Encounter (Signed)
Please refill as per office routine med refill policy (all routine meds to be refilled for 3 mo or monthly (per pt preference) up to one year from last visit, then month to month grace period for 3 mo, then further med refills will have to be denied) ? ?

## 2022-07-14 ENCOUNTER — Encounter: Payer: Self-pay | Admitting: Podiatry

## 2022-07-14 ENCOUNTER — Ambulatory Visit (INDEPENDENT_AMBULATORY_CARE_PROVIDER_SITE_OTHER): Payer: Medicare Other | Admitting: Podiatry

## 2022-07-14 DIAGNOSIS — E1151 Type 2 diabetes mellitus with diabetic peripheral angiopathy without gangrene: Secondary | ICD-10-CM | POA: Diagnosis not present

## 2022-07-14 DIAGNOSIS — M79674 Pain in right toe(s): Secondary | ICD-10-CM | POA: Diagnosis not present

## 2022-07-14 DIAGNOSIS — M2041 Other hammer toe(s) (acquired), right foot: Secondary | ICD-10-CM

## 2022-07-14 DIAGNOSIS — M79675 Pain in left toe(s): Secondary | ICD-10-CM | POA: Diagnosis not present

## 2022-07-14 DIAGNOSIS — L84 Corns and callosities: Secondary | ICD-10-CM | POA: Diagnosis not present

## 2022-07-14 DIAGNOSIS — M2042 Other hammer toe(s) (acquired), left foot: Secondary | ICD-10-CM

## 2022-07-14 DIAGNOSIS — B351 Tinea unguium: Secondary | ICD-10-CM

## 2022-07-14 NOTE — Progress Notes (Signed)
Subjective:   Patient ID: [name], male   DOB: 66 y.o.   MRN: [id number]   HPI Patient presents stating he is doing a better job of taking care of his sugar but he does need diabetic shoes he is having had a long time he is got chronic lesions on both feet he has diminishment of circulatory status both DP PT pulses are intact but weak with no history of claudication with thick yellow brittle nails that he cannot take care of   ROS      Objective:  Physical Exam  At risk patient with mycotic nail infection and also lesion formation with diabetic circulatory issues and also digital deformities bilateral with rotated fourth and fifth toes     Assessment:  Mycotic nail infection bilateral lesions bilateral and at risk diabetic vascular disease with digital deformities     Plan:  Reviewed all conditions discussed daily inspections of feet debrided lesions sharp instrumentation bilateral and nailbeds 1-5 both feet no iatrogenic bleeding and went ahead today and discussed diabetic shoes that he wants matrix explaining the usage of diabetic shoes and he will be casted and will be given approval from his diabetic doctor.

## 2022-07-23 ENCOUNTER — Ambulatory Visit: Payer: Medicare Other | Attending: Nurse Practitioner

## 2022-07-23 DIAGNOSIS — F17210 Nicotine dependence, cigarettes, uncomplicated: Secondary | ICD-10-CM

## 2022-07-23 DIAGNOSIS — Z136 Encounter for screening for cardiovascular disorders: Secondary | ICD-10-CM

## 2022-08-07 ENCOUNTER — Telehealth: Payer: Self-pay | Admitting: Internal Medicine

## 2022-08-07 NOTE — Telephone Encounter (Signed)
N/A unable to leave a message for patient to call back to schedule Medicare Annual Wellness Visit   Last AWV  08/23/21  Please schedule at anytime with [facility] if patient calls the office back.     Any questions, please call me at [phone]

## 2022-08-14 NOTE — Telephone Encounter (Signed)
Attempted to contact patient to schedule, vm full.

## 2022-08-20 NOTE — Telephone Encounter (Signed)
Left msg with wife relaying patient needed to make f/u appt

## 2022-08-21 ENCOUNTER — Telehealth: Payer: Self-pay

## 2022-08-21 NOTE — Telephone Encounter (Signed)
Patient spouse notified that patient assistance is ready for pickup

## 2022-08-21 NOTE — Telephone Encounter (Signed)
[employer] application has been refaxed

## 2022-08-26 ENCOUNTER — Telehealth: Payer: Self-pay | Admitting: Cardiovascular Disease

## 2022-08-26 MED ORDER — SPIRONOLACTONE 25 MG PO TABS: 25.0000 mg | ORAL_TABLET | Freq: Every day | ORAL | 3 refills | Status: DC

## 2022-08-26 NOTE — Telephone Encounter (Signed)
Refill sent to requested pharmacy.

## 2022-08-26 NOTE — Telephone Encounter (Signed)
*  STAT* If patient is at the pharmacy, call can be transferred to refill team.   1. Which medications need to be refilled? (please list name of each medication and dose if known) spironolactone (ALDACTONE) 25 MG tablet   2. Which pharmacy/location (including street and city if local pharmacy) is medication to be sent to?  [place], [place] [address]    3. Do they need a 30 day or 90 day supply? [place]

## 2022-09-02 NOTE — Telephone Encounter (Signed)
Patient picked up 10 boxes of Toujeo

## 2022-09-03 ENCOUNTER — Telehealth: Payer: Self-pay

## 2022-09-03 NOTE — Telephone Encounter (Signed)
5 Boxes of Ozempic received via patient assistance.  Attempted to contact the patient's cell and VM box not yet set up. Called the patient's spouse phone and LVM.

## 2022-09-05 NOTE — Telephone Encounter (Signed)
Patient picked up 5 Boxes of Ozempic received via patient assistance - log noted

## 2022-09-14 ENCOUNTER — Other Ambulatory Visit: Payer: Self-pay | Admitting: Internal Medicine

## 2022-11-12 ENCOUNTER — Telehealth: Payer: Self-pay

## 2022-11-12 NOTE — Telephone Encounter (Signed)
Patient came in to office today and picked up 10 boxes of patient assistance Toujeo.

## 2022-11-12 NOTE — Telephone Encounter (Signed)
Pt contacted and advised patient assistance Tougeo (10 boxes) delivered and ready for pick up.

## 2022-11-24 ENCOUNTER — Other Ambulatory Visit: Payer: Self-pay | Admitting: Internal Medicine

## 2022-11-25 DIAGNOSIS — H04203 Unspecified epiphora, bilateral lacrimal glands: Secondary | ICD-10-CM | POA: Diagnosis not present

## 2022-12-05 ENCOUNTER — Telehealth: Payer: Self-pay

## 2022-12-05 NOTE — Telephone Encounter (Signed)
Patient picked up 5 boxes Ozempic - log noted

## 2022-12-05 NOTE — Telephone Encounter (Signed)
Lvm to advised patient assistance delivered and ready for pick up.  Ozempic 5 boxes

## 2022-12-30 ENCOUNTER — Other Ambulatory Visit: Payer: Self-pay | Admitting: Nurse Practitioner

## 2022-12-30 DIAGNOSIS — I10 Essential (primary) hypertension: Secondary | ICD-10-CM

## 2022-12-30 NOTE — Telephone Encounter (Signed)
Please contact pt for future appointment. Pt overdue for 1 month f/u. Pt needing refills. 

## 2023-01-01 ENCOUNTER — Telehealth: Payer: Self-pay | Admitting: Cardiovascular Disease

## 2023-01-01 NOTE — Telephone Encounter (Signed)
*  STAT* If patient is at the pharmacy, call can be transferred to refill team.   1. Which medications need to be refilled? (please list name of each medication and dose if known)   chlorthalidone (HYGROTON) 25 MG tablet    2. Which pharmacy/location (including street and city if local pharmacy) is medication to be sent to? [facility] - [place], [place] - [address] [place]   3. Do they need a 30 day or 90 day supply? 90 day

## 2023-01-01 NOTE — Telephone Encounter (Signed)
There is an opened refill request pended appt f/u to be scheduled.  Medication sent to pharmacy via refill request.

## 2023-01-01 NOTE — Telephone Encounter (Signed)
Pt is scheduled on 01/06/23.  Also called office to request refill.

## 2023-01-02 ENCOUNTER — Ambulatory Visit (INDEPENDENT_AMBULATORY_CARE_PROVIDER_SITE_OTHER): Payer: Medicare Other | Admitting: Internal Medicine

## 2023-01-02 ENCOUNTER — Encounter: Payer: Self-pay | Admitting: Internal Medicine

## 2023-01-02 VITALS — BP 122/80 | HR 65 | Ht 68.0 in | Wt 220.0 lb

## 2023-01-02 DIAGNOSIS — E1151 Type 2 diabetes mellitus with diabetic peripheral angiopathy without gangrene: Secondary | ICD-10-CM

## 2023-01-02 DIAGNOSIS — E1159 Type 2 diabetes mellitus with other circulatory complications: Secondary | ICD-10-CM | POA: Diagnosis not present

## 2023-01-02 DIAGNOSIS — E1142 Type 2 diabetes mellitus with diabetic polyneuropathy: Secondary | ICD-10-CM

## 2023-01-02 DIAGNOSIS — Z794 Long term (current) use of insulin: Secondary | ICD-10-CM | POA: Diagnosis not present

## 2023-01-02 DIAGNOSIS — E1165 Type 2 diabetes mellitus with hyperglycemia: Secondary | ICD-10-CM

## 2023-01-02 LAB — POCT GLYCOSYLATED HEMOGLOBIN (HGB A1C): Hemoglobin A1C: 8.2 % — AB (ref 4.0–5.6)

## 2023-01-02 NOTE — Patient Instructions (Signed)
Decrease Toujeo 120 units daily  Increase Ozempic 1 mg weekly     HOW TO TREAT LOW BLOOD SUGARS (Blood sugar LESS THAN 70 MG/DL) Please follow the RULE OF 15 for the treatment of hypoglycemia treatment (when your (blood sugars are less than 70 mg/dL)   STEP 1: Take 15 grams of carbohydrates when your blood sugar is low, which includes:  3-4 GLUCOSE TABS  OR 3-4 OZ OF JUICE OR REGULAR SODA OR ONE TUBE OF GLUCOSE GEL    STEP 2: RECHECK blood sugar in 15 MINUTES STEP 3: If your blood sugar is still low at the 15 minute recheck --> then, go back to STEP 1 and treat AGAIN with another 15 grams of carbohydrates.

## 2023-01-02 NOTE — Progress Notes (Signed)
Name: LEMARR [name]  Age/ Sex: 67 y.o., male   MRN/ DOB: [id number], October 30, 1955     PCP: Corwin Levins, MD   Reason for Endocrinology Evaluation: Type 2 Diabetes Mellitus  Initial Endocrine Consultative Visit: 09/06/2012    PATIENT IDENTIFIER: [name] is a 67 y.o. male with a past medical history of T2DM, HTN and Hx of bladder cancer and CAD. The patient has followed with Endocrinology clinic since 09/06/2012 for consultative assistance with management of his diabetes.  DIABETIC HISTORY:  [name] was diagnosed with DM 1992.  He used to be on Metformin- diarrhea. His hemoglobin A1c has ranged from 6.9% in 2013, peaking at 10.9% in 2021  He attributes bladder cancer to actos    SUBJECTIVE:   During the last visit (07/02/2022): A1c 7.5%     Today (01/02/2023): [name]  is here for a follow up on diabetes management .He checks his blood sugars 1 times daily. The patient has had hypoglycemic episodes since the last clinic visit. The patient is  symptomatic with these episodes   Has been coughing that is attributed to allergies  Denies nausea, vomiting , has occasional constipation with diarrhea  He is on pt assistance with Toujeo and Ozempic     HOME DIABETES REGIMEN:  Toujeo 150 units daily  Ozempic 0.5 mg weekly ( Monday )      Statin: yes ACE-I/ARB: yes    METER DOWNLOAD SUMMARY: 5/11-5/24/2024 Fingerstick Blood Glucose Tests = 17 Average Number Tests/Day = 1 Overall Mean FS Glucose = 142 Standard Deviation = 53  BG Ranges: Low = 68 High = 280  BG Target % Results: % In target = 76 % Over target = 18 % Under target = 6  Hypoglycemic Events/30 Days: BG < 50 = 0 Episodes of symptomatic severe hypoglycemia = 0    DIABETIC COMPLICATIONS: Microvascular complications:  Neuropathy Denies: CKD  Last Eye Exam: Completed 11/06/2021  Macrovascular complications:  CAD Denies: CVA, PVD   HISTORY:  Past Medical History:  Past Medical History:   Diagnosis Date   Adenomatous colon polyp    Allergic rhinitis    At risk for sleep apnea    STOP-BANG= 5   SENT TO PCP 03-14-2014   Bladder cancer (HCC)    CAD (coronary artery disease)    a. 07/2014 low risk MV; b. 07/2019 Cor CTA (FFR): LM 25-49 (nl), LAD mild prox/mid plaque (nl), D1 25-49p(nl w/ abnl FFR of 0.73 in inf branch), LCX/OM1 mild prox/mid plaque (nl), RCA nondominant, minimal Ca2+ plaque (nl), RPDA (mildly abnl @ 0.79)-->Med Rx..   Cataract    surgically removed bilateral   Condyloma acuminatum of penis    Diabetic neuropathy (HCC)    Diastolic dysfunction    a. 05/2019 Echo: EF 60-65%, no rwam, mod LVH, impaired relaxation, nl RV size/fxn, trace MR, triv TR.   Diverticulosis    GERD (gastroesophageal reflux disease)    History of bladder cancer    s/p  turbt  2013/   transitional cell carcinoma--    History of condyloma acuminatum    PERINEAL AREA  W/ RECURRENCY   History of gout    Hyperlipidemia    Hypertension    Lower urinary tract symptoms (LUTS)    PAF (paroxysmal atrial fibrillation) (HCC)    a. 06/2019 Event monitor: PAF <1% burden. Longest 3 mins 36 secs.   Productive cough    PSVT (paroxysmal supraventricular tachycardia)    a. 06/2019 Event monitor:  112 episodes of SVT, longest 21 secs.   PVD (peripheral vascular disease) with claudication (HCC)    a. 03/2014 Paul art duplex: long segment occlusion of mid to distal R SFA; b. 03/2020 ABI: nl left and mildly improved R ABI->med rx.   Renal artery stenosis (HCC)    Renal artery stenosis (HCC)    a. 12/2020 Renal art duplex: RRA 1-59%, LRA >60%.   Smokers' cough (HCC)    Type 2 diabetes mellitus with insulin therapy (HCC) 1992   monitor by  dr ellsion   Wears dentures    upper   Past Surgical History:  Past Surgical History:  Procedure Laterality Date   AXILLARY HIDRADENITIS EXCISION  1997   CARDIOVASCULAR STRESS TEST  07-24-2014  dr Lorine Bears   Low risk lexiscan nuclear study with apical thinning  and small inferolateral wall infarct at mid & basal level , no ischemia/  normal LVF and wall motion , ef 59%   CATARACT EXTRACTION Left    CATARACT EXTRACTION W/ INTRAOCULAR LENS IMPLANT Right    CO2 LASER APPLICATION N/A 03/20/2014   Procedure: CO2 LASER APPLICATION,PENIS, GROIN, ANUS;  Surgeon: Ardeth Sportsman, MD;  Location: [facility];  Service: General;  Laterality: N/A;   CO2 LASER APPLICATION N/A 05/21/2015   Procedure: CO2 LASER APPLICATION;  Surgeon: Ihor Gully, MD;  Location[facility];  Service: Urology;  Laterality: N/A;   CONDYLOMA EXCISION/FULGURATION N/A 05/21/2015   Procedure: CONDYLOMA REMOVAL;  Surgeon: Ihor Gully, MD;  Location: [facility];  Service: Urology;  Laterality: N/A;   HEMORRHOID SURGERY  10/24/2014   Procedure: HEMORRHOIDECTOMY;  Surgeon: Karie Soda, MD;  Location: [facility];  Service: General;;   INCISION AND DRAINAGE ABSCESS Left 10/24/2014   Procedure: INCISION AND DRAINAGE ABSCESS;  Surgeon: Karie Soda, MD;  Location: [facility];  Service: General;  Laterality: Left;   INGUINAL HIDRADENITIS EXCISION  1998, 1999   LASER ABLATION CONDOLAMATA N/A 03/20/2014   Procedure: EXAM UNDER ANESTHESIA, REMOVAL/ABLATION OF CONDYLOMATA PENIS,GROINS, ANUS, ANAL CANAL;  Surgeon: Ardeth Sportsman, MD;  Location:  [place];  Service: General;  Laterality: N/A;  groin and anus   LASER ABLATION CONDOLAMATA N/A 10/24/2014   Procedure: LASER ABLATION CONDOLAMATA;  Surgeon: Karie Soda, MD;  Location: [facility];  Service: General;  Laterality: N/A;   LASER ABLATION OF PENILE AND PERIANAL WARTS  07-29-2007  Dr. Michaell Cowing   LEFT SHOULDER SURGERY  2003   MASS EXCISION N/A 10/24/2014   Procedure: EXCISION OF PERINEAL MASS/SINUS;  Surgeon: Karie Soda, MD;  Location: [facility] ;  Service: General;  Laterality: N/A;   MOHS SURGERY     back   MOHS SURGERY   2017   face   MULTIPLE TOOTH EXTRACTIONS     PERINEAL HIDRADENITIS EXCISION  1998, 1999   TRANSURETHRAL RESECTION OF BLADDER TUMOR  05/21/2012   Procedure: TRANSURETHRAL RESECTION OF BLADDER TUMOR (TURBT);  Surgeon: Garnett Farm, MD;  Location: [facility];  Service: Urology;  Laterality: N/A;      Social History:  reports that he has been smoking cigars and cigarettes. He has a 41.00 pack-year smoking history. He has never used smokeless tobacco. He reports that he does not drink alcohol and does not use drugs. Family History:  Family History  Problem Relation Age of Onset   Hypertension Mother    Lung cancer Father    Diabetes Maternal Aunt  x 2   Colon cancer Neg Hx    Esophageal cancer Neg Hx    Pancreatic cancer Neg Hx    Prostate cancer Neg Hx    Kidney disease Neg Hx    Liver disease Neg Hx    Rectal cancer Neg Hx    Stomach cancer Neg Hx      HOME MEDICATIONS: Allergies as of 01/02/2023       Reactions   Cefepime Hives   Had allergic reaction to one of these 3 agents, unclear which one   Metoprolol Hives   Had allergic reaction to one of these 3 agents, unclear which one *Per RN, highly likely this is the cause of allergic rxn   Myrbetriq [mirabegron] Hives   Had allergic reaction to one of these 3 agents, unclear which one        Medication List        Accurate as of Jan 02, 2023  3:09 PM. If you have any questions, ask your nurse or doctor.          aspirin EC 81 MG tablet Take 1 tablet (81 mg total) by mouth daily.   atorvastatin 40 MG tablet Commonly known as: LIPITOR TAKE 1 TABLET BY MOUTH ONCE DAILY   chlorthalidone 25 MG tablet Commonly known as: HYGROTON Take 1 tablet (25 mg total) by mouth daily. Keep 01/06/23 appointment for further refills.   citalopram 20 MG tablet Commonly known as: CELEXA Take 1 tablet by mouth once daily   diltiazem 360 MG 24 hr capsule Commonly known as: CARDIZEM CD Take 1 capsule by  mouth once daily   fenofibrate 145 MG tablet Commonly known as: TRICOR TAKE 1 TABLET BY MOUTH ONCE DAILY **NEED  OFFICE  VISIT  BEFORE  FUTURE  REFILLS**   irbesartan 300 MG tablet Commonly known as: AVAPRO TAKE 1 TABLET BY MOUTH DAILY   isosorbide mononitrate 30 MG 24 hr tablet Commonly known as: IMDUR Take 1/2 (one-half) tablet by mouth once daily   loratadine 10 MG tablet Commonly known as: CLARITIN Take 10 mg by mouth daily as needed.   ONE TOUCH ULTRA 2 w/Device Kit Use as directed to check blood sugars twice a day   OneTouch Delica Plus Lancet30G Misc CHECK BLOOD SUGAR TWICE  DAILY   OneTouch Ultra test strip Generic drug: glucose blood CHECK BLOOD SUGAR TWICE DAILY   Ozempic (0.25 or 0.5 MG/DOSE) 2 MG/3ML Sopn Generic drug: Semaglutide(0.25 or 0.5MG /DOS) Inject 0.5 mg into the skin once a week.   pantoprazole 40 MG tablet Commonly known as: PROTONIX TAKE 1 TABLET BY MOUTH DAILY   spironolactone 25 MG tablet Commonly known as: ALDACTONE Take 1 tablet (25 mg total) by mouth daily.   Toujeo Max SoloStar 300 UNIT/ML Solostar Pen Generic drug: insulin glargine (2 Unit Dial) Inject 150 Units into the skin every morning.   VITAMIN D (CHOLECALCIFEROL) PO Take 5,000 Units by mouth daily.         OBJECTIVE:   Vital Signs: BP 122/80 (BP Location: Left Arm, Patient Position: Sitting, Cuff Size: Large)   Pulse 65   Ht 5\' 8"  (1.727 m)   Wt 220 lb (99.8 kg)   SpO2 99%   BMI 33.45 kg/m   Wt Readings from Last 3 Encounters:  01/02/23 220 lb (99.8 kg)  07/02/22 238 lb (108 kg)  04/09/22 236 lb 12.8 oz (107.4 kg)     Exam: General: Pt appears well and is in NAD  Lungs: Clear with  good BS bilat   Heart: RRR  Extremities: No pretibial edema.   Neuro: MS is good with appropriate affect, pt is alert and Ox3    DM Foot Exam 01/02/2023  The skin of the feet is without sores or ulcerations but has callus formation  The pedal pulses are undetectable The  sensation is absent  to a screening 5.07, 10 gram monofilament bilaterally at the toes    DATA REVIEWED:  Lab Results  Component Value Date   HGBA1C 8.2 (A) 01/02/2023   HGBA1C 7.5 (A) 07/02/2022   HGBA1C 8.7 (H) 04/01/2022   Lab Results  Component Value Date   MICROALBUR 2.1 (H) 04/01/2022   LDLCALC 62 04/21/2022   CREATININE 1.35 04/01/2022   Lab Results  Component Value Date   MICRALBCREAT 3.5 04/01/2022     Lab Results  Component Value Date   CHOL 116 04/21/2022   HDL 30 (L) 04/21/2022   LDLCALC 62 04/21/2022   LDLDIRECT 54.0 05/01/2021   TRIG 118 04/21/2022   CHOLHDL 3.9 04/21/2022         ASSESSMENT / PLAN / RECOMMENDATIONS:   1) Type 2 Diabetes Mellitus, Poorly controlled, With macrovascular and neuropathic complications - Most recent A1c of 8.2 %. Goal A1c < 7.0 %.    -A1c has trended up again -Patient with dietary indiscretions and postprandial hyperglycemia -He continues to have occasional hypoglycemia, will reduce insulin -Have also recommended increasing Ozempic, and new prescription has been sent to [facility] -Patient on patient assistance program for [employer] -He is intolerant to Victoza 1.8 mg -Intolerant to metformin -Patient attributes bladder cancer to pioglitazone use -Not a candidate for SGLT2 inhibitors at this time due to history of bladder cancer and increased risk of infection    MEDICATIONS: Decrease Toujeo to 120 units daily Increase  Ozempic 1 mg weekly  EDUCATION / INSTRUCTIONS: BG monitoring instructions: Patient is instructed to check his blood sugars 1 times a day, fasting. Call [facility] if: BG persistently < 70 I reviewed the Rule of 15 for the treatment of hypoglycemia in detail with the patient. Literature supplied.   2) Diabetic complications:  Eye: Does not have known diabetic retinopathy.  Neuro/ Feet: Does not have known diabetic peripheral neuropathy .  Renal: Patient does not have  known baseline CKD. He   is  on an ACEI/ARB at present.      F/U in 6 months     Signed electronically by: Lyndle Herrlich, MD  [facility]  [facility] [name] [place], [place] [address] Phone: [phone] FAX: [phone]   CC: Corwin Levins, MD [address] [place] [address] Phone: [phone]  Fax: [phone]  Return to Endocrinology clinic as below: Future Appointments  Date Time Provider Department Center  01/06/2023  1:20 PM Iran Ouch, MD [facility] None  01/13/2023  1:20 PM Corwin Levins, MD [facility] None  07/07/2023  2:40 PM Raeqwon Lux, Konrad Dolores, MD [facility] None

## 2023-01-06 ENCOUNTER — Ambulatory Visit: Payer: Medicare Other | Attending: Nurse Practitioner | Admitting: Cardiovascular Disease

## 2023-01-06 ENCOUNTER — Encounter: Payer: Self-pay | Admitting: Cardiovascular Disease

## 2023-01-06 VITALS — BP 120/60 | HR 66 | Ht 68.0 in | Wt 216.5 lb

## 2023-01-06 DIAGNOSIS — I471 Supraventricular tachycardia, unspecified: Secondary | ICD-10-CM

## 2023-01-06 DIAGNOSIS — I739 Peripheral vascular disease, unspecified: Secondary | ICD-10-CM

## 2023-01-06 DIAGNOSIS — E785 Hyperlipidemia, unspecified: Secondary | ICD-10-CM | POA: Diagnosis not present

## 2023-01-06 DIAGNOSIS — I10 Essential (primary) hypertension: Secondary | ICD-10-CM

## 2023-01-06 DIAGNOSIS — I2510 Atherosclerotic heart disease of native coronary artery without angina pectoris: Secondary | ICD-10-CM

## 2023-01-06 NOTE — Patient Instructions (Signed)
Medication Instructions:  STOP the Fenofibrate STOP the Isosorbide  *If you need a refill on your cardiac medications before your next appointment, please call your pharmacy*   Lab Work: None ordered If you have labs (blood work) drawn today and your tests are completely normal, you will receive your results only by: MyChart Message (if you have MyChart) OR A paper copy in the mail If you have any lab test that is abnormal or we need to change your treatment, we will call you to review the results.   Testing/Procedures: None ordered   Follow-Up: At [facility], you and your health needs are our priority.  As part of our continuing mission to provide you with exceptional heart care, we have created designated Provider Care Teams.  These Care Teams include your primary Cardiologist (physician) and Advanced Practice Providers (APPs -  Physician Assistants and Nurse Practitioners) who all work together to provide you with the care you need, when you need it.  We recommend signing up for the patient portal called "MyChart".  Sign up information is provided on this After Visit Summary.  MyChart is used to connect with patients for Virtual Visits (Telemedicine).  Patients are able to view lab/test results, encounter notes, upcoming appointments, etc.  Non-urgent messages can be sent to your provider as well.   To learn more about what you can do with MyChart, go to [email or web address].    Your next appointment:   6 month(s)  Provider:   You may see Lorine Bears, MD or one of the following Advanced Practice Providers on your designated Care Team:   Nicolasa Ducking, NP Eula Listen, PA-C Cadence Fransico Michael, PA-C Charlsie Quest, NP

## 2023-01-06 NOTE — Progress Notes (Signed)
Cardiology Office Note   Date:  01/06/2023   ID:  Aragon, [name] 05-28-1956, MRN [id number]  PCP:  Corwin Levins, MD  Cardiologist:   Lorine Bears, MD   Chief Complaint  Patient presents with   Follow-up    OD 1 month f/u Pt would like to discuss Isosorbide& Fenofibrate and pt would like to discuss bilateral leg circulation due to numbness in toes. Meds reviewed verbally with pt.      History of Present Illness:  [name] is a 67 y.o. male who presents for a follow up visit regarding PAD, coronary artery disease and uncontrolled hypertension. He has known history of insulin-requiring DM (dx'ed 1992),  Hyperlipidemia, hypertension and tobacco use. He is followed for right calf claudication due to occluded right SFA. Noninvasive evaluation in August 2015 showed an ABI of 0.44 on the right and normal on the left. Duplex showed long occlusion of right SFA from mid to distal segment with reconstitution in popliteal artery. There was significant left SFA stenosis.   He had atypical chest pain in late 2015. Nuclear stress test showed a small inferolateral infarct with no significant ischemia. He was treated medically given that his chest pain resolved.  He quit smoking in December 2017.   He was hospitalized in October 2020 with sepsis secondary to UTI and pyelonephritis as well as hypertensive urgency.  His troponin was mildly elevated and peaked at 741.  CTA of the chest was negative for pulmonary embolism but he was noted to have coronary artery calcifications.  Echocardiogram during admission showed an EF of 60 to 65%, moderate LVH and no significant valvular abnormalities.  During his hospitalization, he was noted to have short runs of SVT in the setting of electrolyte abnormalities. The patient subsequently underwent outpatient cardiac CTA which showed coronary calcium score 412.  There was mild left main stenosis and mild first diagonal stenosis.  Previous ZIO monitor in  2020 showed normal sinus rhythm with an average heart rate of 76 bpm.  He was noted to have frequent short runs of SVT as well as paroxysmal atrial fibrillation with a burden of less than 1%.  The longest episode lasted 3 minutes.  The patient was switched from amlodipine to diltiazem. The patient has known history of refractory hypertension.  He had renal artery duplex done in 2022 which overall was a suboptimal study.  It showed no significant right renal artery stenosis but there was borderline stenosis affecting the left renal artery. Most recent lower extremity arterial Doppler showed an ABI of 0.54 on the right and 1.03 on the left. Abdominal aortic ultrasound showed no evidence of aneurysm. He was seen last year in August and had atypical chest pain.  He was prescribed isosorbide but noted no significant change in symptoms.  He stopped the medication after 1 month. He has been doing well with no recent chest pain, worsening dyspnea or palpitations.  He has minimal claudication but does complain of numbness in his toes.     Past Medical History:  Diagnosis Date   Adenomatous colon polyp    Allergic rhinitis    At risk for sleep apnea    STOP-BANG= 5   SENT TO PCP 03-14-2014   Bladder cancer (HCC)    CAD (coronary artery disease)    a. 07/2014 low risk MV; b. 07/2019 Cor CTA (FFR): LM 25-49 (nl), LAD mild prox/mid plaque (nl), D1 25-49p(nl w/ abnl FFR of 0.73 in inf branch), LCX/OM1  mild prox/mid plaque (nl), RCA nondominant, minimal Ca2+ plaque (nl), RPDA (mildly abnl @ 0.79)-->Med Rx..   Cataract    surgically removed bilateral   Condyloma acuminatum of penis    Diabetic neuropathy (HCC)    Diastolic dysfunction    a. 05/2019 Echo: EF 60-65%, no rwam, mod LVH, impaired relaxation, nl RV size/fxn, trace MR, triv TR.   Diverticulosis    GERD (gastroesophageal reflux disease)    History of bladder cancer    s/p  turbt  2013/   transitional cell carcinoma--    History of condyloma  acuminatum    PERINEAL AREA  W/ RECURRENCY   History of gout    Hyperlipidemia    Hypertension    Lower urinary tract symptoms (LUTS)    PAF (paroxysmal atrial fibrillation) (HCC)    a. 06/2019 Event monitor: PAF <1% burden. Longest 3 mins 36 secs.   Productive cough    PSVT (paroxysmal supraventricular tachycardia)    a. 06/2019 Event monitor: 112 episodes of SVT, longest 21 secs.   PVD (peripheral vascular disease) with claudication (HCC)    a. 03/2014 LE art duplex: long segment occlusion of mid to distal R SFA; b. 03/2020 ABI: nl left and mildly improved R ABI->med rx.   Renal artery stenosis (HCC)    Renal artery stenosis (HCC)    a. 12/2020 Renal art duplex: RRA 1-59%, LRA >60%.   Smokers' cough (HCC)    Type 2 diabetes mellitus with insulin therapy (HCC) 1992   monitor by  dr ellsion   Wears dentures    upper    Past Surgical History:  Procedure Laterality Date   AXILLARY HIDRADENITIS EXCISION  1997   CARDIOVASCULAR STRESS TEST  07-24-2014  dr Lorine Bears   Low risk lexiscan nuclear study with apical thinning and small inferolateral wall infarct at mid & basal level , no ischemia/  normal LVF and wall motion , ef 59%   CATARACT EXTRACTION Left    CATARACT EXTRACTION W/ INTRAOCULAR LENS IMPLANT Right    CO2 LASER APPLICATION N/A 03/20/2014   Procedure: CO2 LASER APPLICATION,PENIS, GROIN, ANUS;  Surgeon: Ardeth Sportsman, MD;  Location: [facility];  Service: General;  Laterality: N/A;   CO2 LASER APPLICATION N/A 05/21/2015   Procedure: CO2 LASER APPLICATION;  Surgeon: Ihor Gully, MD;  Location: [facility];  Service: Urology;  Laterality: N/A;   CONDYLOMA EXCISION/FULGURATION N/A 05/21/2015   Procedure: CONDYLOMA REMOVAL;  Surgeon: Ihor Gully, MD;  Location: [facility];  Service: Urology;  Laterality: N/A;   HEMORRHOID SURGERY  10/24/2014   Procedure: HEMORRHOIDECTOMY;  Surgeon: Karie Soda, MD;  Location: [facility];  Service: General;;   INCISION AND DRAINAGE ABSCESS Left 10/24/2014   Procedure: INCISION AND DRAINAGE ABSCESS;  Surgeon: Karie Soda, MD;  Location: [facility];  Service: General;  Laterality: Left;   INGUINAL HIDRADENITIS EXCISION  1998, 1999   LASER ABLATION CONDOLAMATA N/A 03/20/2014   Procedure: EXAM UNDER ANESTHESIA, REMOVAL/ABLATION OF CONDYLOMATA PENIS,GROINS, ANUS, ANAL CANAL;  Surgeon: Ardeth Sportsman, MD;  Location: [facility] [place];  Service: General;  Laterality: N/A;  groin and anus   LASER ABLATION CONDOLAMATA N/A 10/24/2014   Procedure: LASER ABLATION CONDOLAMATA;  Surgeon: Karie Soda, MD;  Location: [facility];  Service: General;  Laterality: N/A;   LASER ABLATION OF PENILE AND PERIANAL WARTS  07-29-2007  Dr. Michaell Cowing   LEFT SHOULDER SURGERY  2003   MASS  EXCISION N/A 10/24/2014   Procedure: EXCISION OF PERINEAL MASS/SINUS;  Surgeon: Karie Soda, MD;  Location: [facility];  Service: General;  Laterality: N/A;   MOHS SURGERY     back   MOHS SURGERY  2017   face   MULTIPLE TOOTH EXTRACTIONS     PERINEAL HIDRADENITIS EXCISION  1998, 1999   TRANSURETHRAL RESECTION OF BLADDER TUMOR  05/21/2012   Procedure: TRANSURETHRAL RESECTION OF BLADDER TUMOR (TURBT);  Surgeon: Garnett Farm, MD;  Location: [facility];  Service: Urology;  Laterality: N/A;        Current Outpatient Medications  Medication Sig Dispense Refill   aspirin EC 81 MG tablet Take 1 tablet (81 mg total) by mouth daily.     atorvastatin (LIPITOR) 40 MG tablet TAKE 1 TABLET BY MOUTH ONCE DAILY 90 tablet 3   Blood Glucose Monitoring Suppl (ONE TOUCH ULTRA 2) w/Device KIT Use as directed to check blood sugars twice a day 1 kit 0   chlorthalidone (HYGROTON) 25 MG tablet Take 1 tablet (25 mg total) by mouth daily. Keep 01/06/23 appointment for further refills. 90 tablet 0   citalopram (CELEXA) 20 MG tablet Take 1 tablet by mouth once daily 90  tablet 1   diltiazem (CARDIZEM CD) 360 MG 24 hr capsule Take 1 capsule by mouth once daily 90 capsule 0   insulin glargine, 2 Unit Dial, (TOUJEO MAX SOLOSTAR) 300 UNIT/ML Solostar Pen Inject 150 Units into the skin every morning. (Patient taking differently: Inject 120 Units into the skin every morning.) 45 mL 3   irbesartan (AVAPRO) 300 MG tablet TAKE 1 TABLET BY MOUTH DAILY 70 tablet 4   Lancets (ONETOUCH DELICA PLUS LANCET30G) MISC CHECK BLOOD SUGAR TWICE  DAILY 200 each 3   loratadine (CLARITIN) 10 MG tablet Take 10 mg by mouth daily as needed.     ONETOUCH ULTRA test strip CHECK BLOOD SUGAR TWICE DAILY 200 strip 3   Semaglutide,0.25 or 0.5MG /DOS, (OZEMPIC, 0.25 OR 0.5 MG/DOSE,) 2 MG/3ML SOPN Inject 0.5 mg into the skin once a week. 6 mL 3   spironolactone (ALDACTONE) 25 MG tablet Take 1 tablet (25 mg total) by mouth daily. 90 tablet 3   VITAMIN D, CHOLECALCIFEROL, PO Take 5,000 Units by mouth daily.     fenofibrate (TRICOR) 145 MG tablet TAKE 1 TABLET BY MOUTH ONCE DAILY **NEED  OFFICE  VISIT  BEFORE  FUTURE  REFILLS** (Patient not taking: Reported on 01/06/2023) 15 tablet 0   isosorbide mononitrate (IMDUR) 30 MG 24 hr tablet Take 1/2 (one-half) tablet by mouth once daily (Patient not taking: Reported on 01/06/2023) 45 tablet 2   pantoprazole (PROTONIX) 40 MG tablet TAKE 1 TABLET BY MOUTH DAILY (Patient not taking: Reported on 01/06/2023) 90 tablet 3   No current facility-administered medications for this visit.    Allergies:   Cefepime, Metoprolol, and Myrbetriq [mirabegron]    Social History:  The patient  reports that he has been smoking cigars and cigarettes. He has a 41.00 pack-year smoking history. He has never used smokeless tobacco. He reports that he does not drink alcohol and does not use drugs.   Family History:  The patient's family history includes Diabetes in his maternal aunt; Hypertension in his mother; Lung cancer in his father.    ROS:  Please see the history of present  illness.   Otherwise, review of systems are positive for none.   All other systems are reviewed and negative.    PHYSICAL EXAM:  VS:  BP 120/60 (BP Location: Left Arm, Patient Position: Sitting, Cuff Size: Normal)   Pulse 66   Ht 5\' 8"  (1.727 m)   Wt 216 lb 8 oz (98.2 kg)   SpO2 98%   BMI 32.92 kg/m  , BMI Body mass index is 32.92 kg/m. GEN: Well nourished, well developed, in no acute distress  HEENT: normal  Neck: no JVD, carotid bruits, or masses Cardiac: RRR; no murmurs, rubs, or gallops, trace edema  Respiratory:  clear to auscultation bilaterally, normal work of breathing GI: soft, nontender, nondistended, + BS MS: no deformity or atrophy  Skin: warm and dry, no rash Neuro:  Strength and sensation are intact Psych: euthymic mood, full affect   EKG:  EKG is ordered today. EKG showed normal sinus rhythm with poor R wave progression in the anterior leads.  No significant ST changes.   Recent Labs: 04/01/2022: BUN 24; Creatinine, Ser 1.35; Potassium 4.5; Sodium 134; TSH 0.83 04/21/2022: ALT 31; Hemoglobin 18.0; Platelets 310    Lipid Panel    Component Value Date/Time   CHOL 116 04/21/2022 1017   CHOL 151 06/16/2019 1144   TRIG 118 04/21/2022 1017   HDL 30 (L) 04/21/2022 1017   HDL 28 (L) 06/16/2019 1144   CHOLHDL 3.9 04/21/2022 1017   VLDL 24 04/21/2022 1017   LDLCALC 62 04/21/2022 1017   LDLCALC 88 06/16/2019 1144   LDLDIRECT 54.0 05/01/2021 1525      Wt Readings from Last 3 Encounters:  01/06/23 216 lb 8 oz (98.2 kg)  01/02/23 220 lb (99.8 kg)  07/02/22 238 lb (108 kg)       ASSESSMENT AND PLAN:  1.  Peripheral arterial disease: Known occluded right SFA .stable right calf claudication.  Numbness in his toes are likely due to neuropathy.  Continue medical therapy.  2. Coronary artery disease involving native coronary arteries without angina: Cardiac CTA showed elevated calcium score as outlined above with mild left main and diagonal disease.  Recommend  aggressive medical therapy.  His chest pain is overall atypical and isosorbide did not make any difference in his symptoms and thus he stopped the medication after 1 month.  No need to resume.  3.  Paroxysmal supraventricular tachycardia: A. fib burden was not significant enough to justify anticoagulation.  Symptoms are controlled with diltiazem.  4. Essential hypertension: Blood pressure is now well-controlled on current medications.  He is going to get labs done with his physical next week.  5.  Hyperlipidemia: Continue treatment with atorvastatin.  Most recent LDL was 62.  His triglyceride was mildly elevated but there has been no cardiovascular benefit for fibrates.  Thus, I discontinued fenofibrate.   Disposition:   FU with me in 6 months.  Signed,  Lorine Bears, MD  01/06/2023 1:39 PM    [facility]

## 2023-01-13 ENCOUNTER — Encounter: Payer: Self-pay | Admitting: Internal Medicine

## 2023-01-13 ENCOUNTER — Ambulatory Visit (INDEPENDENT_AMBULATORY_CARE_PROVIDER_SITE_OTHER): Payer: Medicare Other | Admitting: Internal Medicine

## 2023-01-13 ENCOUNTER — Ambulatory Visit (INDEPENDENT_AMBULATORY_CARE_PROVIDER_SITE_OTHER): Payer: Medicare Other

## 2023-01-13 VITALS — BP 120/72 | HR 65 | Temp 97.5°F | Ht 68.0 in | Wt 215.0 lb

## 2023-01-13 DIAGNOSIS — R059 Cough, unspecified: Secondary | ICD-10-CM | POA: Diagnosis not present

## 2023-01-13 DIAGNOSIS — E785 Hyperlipidemia, unspecified: Secondary | ICD-10-CM

## 2023-01-13 DIAGNOSIS — Z0001 Encounter for general adult medical examination with abnormal findings: Secondary | ICD-10-CM

## 2023-01-13 DIAGNOSIS — E559 Vitamin D deficiency, unspecified: Secondary | ICD-10-CM | POA: Diagnosis not present

## 2023-01-13 DIAGNOSIS — E538 Deficiency of other specified B group vitamins: Secondary | ICD-10-CM | POA: Diagnosis not present

## 2023-01-13 DIAGNOSIS — R062 Wheezing: Secondary | ICD-10-CM

## 2023-01-13 DIAGNOSIS — Z125 Encounter for screening for malignant neoplasm of prostate: Secondary | ICD-10-CM

## 2023-01-13 DIAGNOSIS — R051 Acute cough: Secondary | ICD-10-CM | POA: Diagnosis not present

## 2023-01-13 DIAGNOSIS — I10 Essential (primary) hypertension: Secondary | ICD-10-CM | POA: Diagnosis not present

## 2023-01-13 DIAGNOSIS — Z794 Long term (current) use of insulin: Secondary | ICD-10-CM | POA: Diagnosis not present

## 2023-01-13 DIAGNOSIS — E1151 Type 2 diabetes mellitus with diabetic peripheral angiopathy without gangrene: Secondary | ICD-10-CM

## 2023-01-13 LAB — CBC WITH DIFFERENTIAL/PLATELET
Basophils Absolute: 0.1 10*3/uL (ref 0.0–0.1)
Basophils Relative: 0.8 % (ref 0.0–3.0)
Eosinophils Absolute: 0.4 10*3/uL (ref 0.0–0.7)
Eosinophils Relative: 2.8 % (ref 0.0–5.0)
HCT: 48.1 % (ref 39.0–52.0)
Hemoglobin: 16.6 g/dL (ref 13.0–17.0)
Lymphocytes Relative: 19.8 % (ref 12.0–46.0)
Lymphs Abs: 3.1 10*3/uL (ref 0.7–4.0)
MCHC: 34.5 g/dL (ref 30.0–36.0)
MCV: 96.3 fl (ref 78.0–100.0)
Monocytes Absolute: 1.1 10*3/uL — ABNORMAL HIGH (ref 0.1–1.0)
Monocytes Relative: 6.7 % (ref 3.0–12.0)
Neutro Abs: 11 10*3/uL — ABNORMAL HIGH (ref 1.4–7.7)
Neutrophils Relative %: 69.9 % (ref 43.0–77.0)
Platelets: 288 10*3/uL (ref 150.0–400.0)
RBC: 5 Mil/uL (ref 4.22–5.81)
RDW: 12.8 % (ref 11.5–15.5)
WBC: 15.8 10*3/uL — ABNORMAL HIGH (ref 4.0–10.5)

## 2023-01-13 LAB — URINALYSIS, ROUTINE W REFLEX MICROSCOPIC
Bilirubin Urine: NEGATIVE
Hgb urine dipstick: NEGATIVE
Ketones, ur: NEGATIVE
Leukocytes,Ua: NEGATIVE
Nitrite: NEGATIVE
RBC / HPF: NONE SEEN (ref 0–?)
Specific Gravity, Urine: 1.02 (ref 1.000–1.030)
Total Protein, Urine: NEGATIVE
Urine Glucose: NEGATIVE
Urobilinogen, UA: 1 (ref 0.0–1.0)
pH: 6 (ref 5.0–8.0)

## 2023-01-13 LAB — HEPATIC FUNCTION PANEL
ALT: 28 U/L (ref 0–53)
AST: 21 U/L (ref 0–37)
Albumin: 4.2 g/dL (ref 3.5–5.2)
Alkaline Phosphatase: 78 U/L (ref 39–117)
Bilirubin, Direct: 0.2 mg/dL (ref 0.0–0.3)
Total Bilirubin: 0.7 mg/dL (ref 0.2–1.2)
Total Protein: 7.6 g/dL (ref 6.0–8.3)

## 2023-01-13 LAB — BASIC METABOLIC PANEL
BUN: 19 mg/dL (ref 6–23)
CO2: 30 mEq/L (ref 19–32)
Calcium: 9.5 mg/dL (ref 8.4–10.5)
Chloride: 99 mEq/L (ref 96–112)
Creatinine, Ser: 1.16 mg/dL (ref 0.40–1.50)
GFR: 65.74 mL/min (ref >60.00)
Glucose, Bld: 105 mg/dL — ABNORMAL HIGH (ref 70–99)
Potassium: 4 mEq/L (ref 3.5–5.1)
Sodium: 139 mEq/L (ref 135–145)

## 2023-01-13 LAB — LIPID PANEL
Cholesterol: 97 mg/dL (ref 0–200)
HDL: 30.1 mg/dL — ABNORMAL LOW (ref >39.00)
LDL Cholesterol: 41 mg/dL (ref 0–99)
NonHDL: 66.68
Total CHOL/HDL Ratio: 3
Triglycerides: 127 mg/dL (ref 0.0–149.0)
VLDL: 25.4 mg/dL (ref 0.0–40.0)

## 2023-01-13 LAB — TSH: TSH: 1.3 u[IU]/mL (ref 0.35–5.50)

## 2023-01-13 LAB — MICROALBUMIN / CREATININE URINE RATIO
Creatinine,U: 148.2 mg/dL
Microalb Creat Ratio: 0.9 mg/g (ref 0.0–30.0)
Microalb, Ur: 1.3 mg/dL (ref 0.0–1.9)

## 2023-01-13 LAB — HEMOGLOBIN A1C: Hgb A1c MFr Bld: 8.6 % — ABNORMAL HIGH (ref 4.6–6.5)

## 2023-01-13 LAB — VITAMIN B12: Vitamin B-12: 436 pg/mL (ref 211–911)

## 2023-01-13 LAB — VITAMIN D 25 HYDROXY (VIT D DEFICIENCY, FRACTURES): VITD: 47.62 ng/mL (ref 30.00–100.00)

## 2023-01-13 MED ORDER — METHYLPREDNISOLONE ACETATE 80 MG/ML IJ SUSP
80.0000 mg | Freq: Once | INTRAMUSCULAR | Status: AC
  Administered 2023-01-13: 80 mg via INTRAMUSCULAR

## 2023-01-13 MED ORDER — LEVOFLOXACIN 500 MG PO TABS: 500.0000 mg | ORAL_TABLET | Freq: Every day | ORAL | 0 refills | Status: AC

## 2023-01-13 MED ORDER — HYDROCODONE BIT-HOMATROP MBR 5-1.5 MG/5ML PO SOLN: 5.0000 mL | Freq: Four times a day (QID) | ORAL | 0 refills | Status: AC | PRN

## 2023-01-13 MED ORDER — PREDNISONE 10 MG PO TABS: ORAL_TABLET | ORAL | 0 refills | Status: DC

## 2023-01-13 NOTE — Progress Notes (Unsigned)
Patient ID: [name], male   DOB: October 05, 1955, 67 y.o.   MRN: [id number]        Chief Complaint:: wellness exam and Annual Exam (Been dealing with sinus issues )  With cough and wheezing, dm, htn, hld, low vit d       HPI:  [name] is a 67 y.o. male here for wellness exam; for shingrix and pneumovax,at pharmacy, declines need for pulm referral for LDCT screening, o/w up to date.  Still smoking, not ready to quit                        Also sees endo for dm.  Peak wt has been 265 in the past, lost wt with ozempic with other meds. Does have several wks ongoing nasal allergy symptoms with clearish congestion, itch and sneezing, without fever, pain, ST, cough, swelling or wheezing. Here with acute onset mild to mod 2-3 wks ST, HA, general weakness and malaise, with prod cough greenish sputum, but Pt denies chest pain, increased sob or doe, wheezing, orthopnea, PND, [name], palpitations, dizziness or syncope, except for mild wheezing sob in the past 2 days.     Wt Readings from Last 3 Encounters:  01/13/23 215 lb (97.5 kg)  01/06/23 216 lb 8 oz (98.2 kg)  01/02/23 220 lb (99.8 kg)   BP Readings from Last 3 Encounters:  01/13/23 120/72  01/06/23 120/60  01/02/23 122/80   Immunization History  Administered Date(s) Administered   Fluad Quad(high Dose 65+) 04/21/2019   Influenza Split 07/07/2011, 06/08/2012   Influenza Whole 05/04/2008, 05/22/2009, 05/02/2010   Influenza,inj,Quad PF,6+ Mos 06/06/2013, 05/05/2014, 07/02/2015, 05/05/2016, 06/15/2018, 04/26/2020, 05/01/2021   PFIZER(Purple Top)SARS-COV-2 Vaccination 11/23/2019, 12/14/2019   Pneumococcal Polysaccharide-23 01/10/1996, 05/05/2016   Td 02/09/2000   Tdap 02/10/2011, 06/05/2016   Health Maintenance Due  Topic Date Due   Zoster Vaccines- Shingrix (1 of 2) Never done   Medicare Annual Wellness (AWV)  08/23/2022      Past Medical History:  Diagnosis Date   Adenomatous colon polyp    Allergic rhinitis    At  risk for sleep apnea    STOP-BANG= 5   SENT TO PCP 03-14-2014   Bladder cancer (HCC)    CAD (coronary artery disease)    a. 07/2014 low risk MV; b. 07/2019 Cor CTA (FFR): LM 25-49 (nl), LAD mild prox/mid plaque (nl), D1 25-49p(nl w/ abnl FFR of 0.73 in inf branch), LCX/OM1 mild prox/mid plaque (nl), RCA nondominant, minimal Ca2+ plaque (nl), RPDA (mildly abnl @ 0.79)-->Med Rx..   Cataract    surgically removed bilateral   Condyloma acuminatum of penis    Diabetic neuropathy (HCC)    Diastolic dysfunction    a. 05/2019 Echo: EF 60-65%, no rwam, mod LVH, impaired relaxation, nl RV size/fxn, trace MR, triv TR.   Diverticulosis    GERD (gastroesophageal reflux disease)    History of bladder cancer    s/p  turbt  2013/   transitional cell carcinoma--    History of condyloma acuminatum    PERINEAL AREA  W/ RECURRENCY   History of gout    Hyperlipidemia    Hypertension    Lower urinary tract symptoms (LUTS)    PAF (paroxysmal atrial fibrillation) (HCC)    a. 06/2019 Event monitor: PAF <1% burden. Longest 3 mins 36 secs.   Productive cough    PSVT (paroxysmal supraventricular tachycardia)    a. 06/2019 Event monitor: 112 episodes of  SVT, longest 21 secs.   PVD (peripheral vascular disease) with claudication (HCC)    a. 03/2014 LE art duplex: long segment occlusion of mid to distal R SFA; b. 03/2020 ABI: nl left and mildly improved R ABI->med rx.   Renal artery stenosis (HCC)    Renal artery stenosis (HCC)    a. 12/2020 Renal art duplex: RRA 1-59%, LRA >60%.   Smokers' cough (HCC)    Type 2 diabetes mellitus with insulin therapy (HCC) 1992   monitor by  dr ellsion   Wears dentures    upper   Past Surgical History:  Procedure Laterality Date   AXILLARY HIDRADENITIS EXCISION  1997   CARDIOVASCULAR STRESS TEST  07-24-2014  dr Lorine Bears   Low risk lexiscan nuclear study with apical thinning and small inferolateral wall infarct at mid & basal level , no ischemia/  normal LVF and wall  motion , ef 59%   CATARACT EXTRACTION Left    CATARACT EXTRACTION W/ INTRAOCULAR LENS IMPLANT Right    CO2 LASER APPLICATION N/A 03/20/2014   Procedure: CO2 LASER APPLICATION,PENIS, GROIN, ANUS;  Surgeon: Ardeth Sportsman, MD;  Location: [facility];  Service: General;  Laterality: N/A;   CO2 LASER APPLICATION N/A 05/21/2015   Procedure: CO2 LASER APPLICATION;  Surgeon: Ihor Gully, MD;  Location: [facility];  Service: Urology;  Laterality: N/A;   CONDYLOMA EXCISION/FULGURATION N/A 05/21/2015   Procedure: CONDYLOMA REMOVAL;  Surgeon: Ihor Gully, MD;  Location: [facility];  Service: Urology;  Laterality: N/A;   HEMORRHOID SURGERY  10/24/2014   Procedure: HEMORRHOIDECTOMY;  Surgeon: Karie Soda, MD;  Location: [facility];  Service: General;;   INCISION AND DRAINAGE ABSCESS Left 10/24/2014   Procedure: INCISION AND DRAINAGE ABSCESS;  Surgeon: Karie Soda, MD;  Location: [facility];  Service: General;  Laterality: Left;   INGUINAL HIDRADENITIS EXCISION  1998, 1999   LASER ABLATION CONDOLAMATA N/A 03/20/2014   Procedure: EXAM UNDER ANESTHESIA, REMOVAL/ABLATION OF CONDYLOMATA PENIS,GROINS, ANUS, ANAL CANAL;  Surgeon: Ardeth Sportsman, MD;  Location: [facility];  Service: General;  Laterality: N/A;  groin and anus   LASER ABLATION CONDOLAMATA N/A 10/24/2014   Procedure: LASER ABLATION CONDOLAMATA;  Surgeon: Karie Soda, MD;  Location: [facility];  Service: General;  Laterality: N/A;   LASER ABLATION OF PENILE AND PERIANAL WARTS  07-29-2007  Dr. Michaell Cowing   LEFT SHOULDER SURGERY  2003   MASS EXCISION N/A 10/24/2014   Procedure: EXCISION OF PERINEAL MASS/SINUS;  Surgeon: Karie Soda, MD;  Location: [facility];  Service: General;  Laterality: N/A;   MOHS SURGERY     back   MOHS SURGERY  2017   face   MULTIPLE TOOTH EXTRACTIONS     PERINEAL HIDRADENITIS EXCISION  1998, 1999    TRANSURETHRAL RESECTION OF BLADDER TUMOR  05/21/2012   Procedure: TRANSURETHRAL RESECTION OF BLADDER TUMOR (TURBT);  Surgeon: Garnett Farm, MD;  Location: [facility];  Service: Urology;  Laterality: N/A;       reports that he has been smoking cigars and cigarettes. He has a 41.00 pack-year smoking history. He has never used smokeless tobacco. He reports that he does not drink alcohol and does not use drugs. family history includes Diabetes in his maternal aunt; Hypertension in his mother; Lung cancer in his father. Allergies  Allergen Reactions   Cefepime Hives    Had allergic reaction to one of these 3 agents,  unclear which one   Metoprolol Hives    Had allergic reaction to one of these 3 agents, unclear which one  *Per RN, highly likely this is the cause of allergic rxn   Myrbetriq [Mirabegron] Hives    Had allergic reaction to one of these 3 agents, unclear which one   Current Outpatient Medications on File Prior to Visit  Medication Sig Dispense Refill   aspirin EC 81 MG tablet Take 1 tablet (81 mg total) by mouth daily.     atorvastatin (LIPITOR) 40 MG tablet TAKE 1 TABLET BY MOUTH ONCE DAILY 90 tablet 3   Blood Glucose Monitoring Suppl (ONE TOUCH ULTRA 2) w/Device KIT Use as directed to check blood sugars twice a day 1 kit 0   chlorthalidone (HYGROTON) 25 MG tablet Take 1 tablet (25 mg total) by mouth daily. Keep 01/06/23 appointment for further refills. 90 tablet 0   citalopram (CELEXA) 20 MG tablet Take 1 tablet by mouth once daily 90 tablet 1   diltiazem (CARDIZEM CD) 360 MG 24 hr capsule Take 1 capsule by mouth once daily 90 capsule 0   insulin glargine, 2 Unit Dial, (TOUJEO MAX SOLOSTAR) 300 UNIT/ML Solostar Pen Inject 150 Units into the skin every morning. (Patient taking differently: Inject 120 Units into the skin every morning.) 45 mL 3   irbesartan (AVAPRO) 300 MG tablet TAKE 1 TABLET BY MOUTH DAILY 70 tablet 4   Lancets (ONETOUCH DELICA PLUS LANCET30G) MISC  CHECK BLOOD SUGAR TWICE  DAILY 200 each 3   loratadine (CLARITIN) 10 MG tablet Take 10 mg by mouth daily as needed.     ONETOUCH ULTRA test strip CHECK BLOOD SUGAR TWICE DAILY 200 strip 3   pantoprazole (PROTONIX) 40 MG tablet TAKE 1 TABLET BY MOUTH DAILY 90 tablet 3   Semaglutide,0.25 or 0.5MG /DOS, (OZEMPIC, 0.25 OR 0.5 MG/DOSE,) 2 MG/3ML SOPN Inject 0.5 mg into the skin once a week. 6 mL 3   spironolactone (ALDACTONE) 25 MG tablet Take 1 tablet (25 mg total) by mouth daily. 90 tablet 3   VITAMIN D, CHOLECALCIFEROL, PO Take 5,000 Units by mouth daily.     No current facility-administered medications on file prior to visit.        ROS:  All others reviewed and negative.  Objective        PE:  BP 120/72 (BP Location: Right Arm, Patient Position: Sitting, Cuff Size: Normal)   Pulse 65   Temp (!) 97.5 F (36.4 C) (Oral)   Ht 5\' 8"  (1.727 m)   Wt 215 lb (97.5 kg)   SpO2 91%   BMI 32.69 kg/m                 Constitutional: Pt appears in NAD               HENT: Head: NCAT.                Right Ear: External ear normal.                 Left Ear: External ear normal. Bilat tm's with mild erythema.  Max sinus areas mild tender.  Pharynx with mild erythema, no exudate               Eyes: . Pupils are equal, round, and reactive to light. Conjunctivae and EOM are normal               Nose: without d/c or deformity  Neck: Neck supple. Gross normal ROM               Cardiovascular: Normal rate and regular rhythm.                 Pulmonary/Chest: Effort normal and breath sounds without rales but with few bilatearl wheezing.                Abd:  Soft, NT, ND, + BS, no organomegaly               Neurological: Pt is alert. At baseline orientation, motor grossly intact               Skin: Skin is warm. No rashes, no other new lesions, LE edema - none               Psychiatric: Pt behavior is normal without agitation   Micro: none  Cardiac tracings I have personally interpreted  today:  none  Pertinent Radiological findings (summarize): none   Lab Results  Component Value Date   WBC 15.8 (H) 01/13/2023   HGB 16.6 01/13/2023   HCT 48.1 01/13/2023   PLT 288.0 01/13/2023   GLUCOSE 105 (H) 01/13/2023   CHOL 97 01/13/2023   TRIG 127.0 01/13/2023   HDL 30.10 (L) 01/13/2023   LDLDIRECT 54.0 05/01/2021   LDLCALC 41 01/13/2023   ALT 28 01/13/2023   AST 21 01/13/2023   NA 139 01/13/2023   K 4.0 01/13/2023   CL 99 01/13/2023   CREATININE 1.16 01/13/2023   BUN 19 01/13/2023   CO2 30 01/13/2023   TSH 1.30 01/13/2023   PSA 2.04 04/01/2022   INR 1.1 06/04/2019   HGBA1C 8.6 (H) 01/13/2023   MICROALBUR 1.3 01/13/2023   Assessment/Plan:  [name] is a 67 y.o. White or Caucasian [1] male with  has a past medical history of Adenomatous colon polyp, Allergic rhinitis, At risk for sleep apnea, Bladder cancer (HCC), CAD (coronary artery disease), Cataract, Condyloma acuminatum of penis, Diabetic neuropathy (HCC), Diastolic dysfunction, Diverticulosis, GERD (gastroesophageal reflux disease), History of bladder cancer, History of condyloma acuminatum, History of gout, Hyperlipidemia, Hypertension, Lower urinary tract symptoms (LUTS), PAF (paroxysmal atrial fibrillation) (HCC), Productive cough, PSVT (paroxysmal supraventricular tachycardia), PVD (peripheral vascular disease) with claudication (HCC), Renal artery stenosis (HCC), Renal artery stenosis (HCC), Smokers' cough (HCC), Type 2 diabetes mellitus with insulin therapy (HCC) (1992), and Wears dentures.  Encounter for well adult exam with abnormal findings Age and sex appropriate education and counseling updated with regular exercise and diet Referrals for preventative services - declines pulm referral for LDCT screening Immunizations addressed - or shingrix and prevnar at the pharmacy Smoking counseling  - counsled to quit, pt not ready Evidence for depression or other mood disorder - none significant Most recent labs  reviewed. I have personally reviewed and have noted: 1) the patient's medical and social history 2) The patient's current medications and supplements 3) The patient's height, weight, and BMI have been recorded in the chart   Essential hypertension BP Readings from Last 3 Encounters:  01/13/23 120/72  01/06/23 120/60  01/02/23 122/80   Stable, pt to continue medical treatment chlorthalidone 25 qd, card cd 360 qd, avapro 300 qd    Cough Mild to mod, c/w bronchitis vs pna, for antibx course levaquin 500 qd cough med prn,  to f/u any worsening symptoms or concerns   Dyslipidemia Lab Results  Component Value Date   LDLCALC 41 01/13/2023   Stable, pt  to continue current statin lipitor 40 mg qd   Diabetes (HCC) Lab Results  Component Value Date   HGBA1C 8.6 (H) 01/13/2023   Uncontrolled, pt to continue current medical treatment toujeo 150 qd , ozempic 0.5 mg weekly,   Vitamin D deficiency Last vitamin D Lab Results  Component Value Date   VD25OH 47.62 01/13/2023   Stable, cont oral replacement   Wheezing Mild to mod, for depomedrol IM80 mg prednisone taper, for cxr, to f/u any worsening symptoms or concerns  Followup: Return in about 6 months (around 07/15/2023).  Oliver Barre, MD 01/15/2023 4:07 PM [facility]

## 2023-01-13 NOTE — Patient Instructions (Addendum)
You had the steroid shot today  Please take all new medication as prescribed - the antibiotic, cough medicine, and prednisone  Please have your Shingrix (shingles) shots done at your local pharmacy.  Please continue all other medications as before, and refills have been done if requested.  Please have the pharmacy call with any other refills you may need.  Please continue your efforts at being more active, low cholesterol diet, and weight control.  You are otherwise up to date with prevention measures today.  Please keep your appointments with your specialists as you may have planned - Endocrinology as planned  Please go to the XRAY Department in the first floor for the x-ray testing  Please go to the LAB at the blood drawing area for the tests to be done  You will be contacted by phone if any changes need to be made immediately.  Otherwise, you will receive a letter about your results with an explanation, but please check with MyChart first.  Please remember to sign up for MyChart if you have not done so, as this will be important to you in the future with finding out test results, communicating by private email, and scheduling acute appointments online when needed.  Please make an Appointment to return in 6 months, or sooner if needed

## 2023-01-15 ENCOUNTER — Encounter: Payer: Self-pay | Admitting: Internal Medicine

## 2023-01-15 NOTE — Assessment & Plan Note (Signed)
Lab Results  Component Value Date   LDLCALC 41 01/13/2023   Stable, pt to continue current statin lipitor 40 mg qd

## 2023-01-15 NOTE — Assessment & Plan Note (Signed)
BP Readings from Last 3 Encounters:  01/13/23 120/72  01/06/23 120/60  01/02/23 122/80   Stable, pt to continue medical treatment chlorthalidone 25 qd, card cd 360 qd, avapro 300 qd

## 2023-01-15 NOTE — Assessment & Plan Note (Signed)
Mild to mod, c/w bronchitis vs pna, for antibx course levaquin 500 qd cough med prn,  to f/u any worsening symptoms or concerns

## 2023-01-15 NOTE — Assessment & Plan Note (Signed)
Age and sex appropriate education and counseling updated with regular exercise and diet Referrals for preventative services - declines pulm referral for LDCT screening Immunizations addressed - or shingrix and prevnar at the pharmacy Smoking counseling  - counsled to quit, pt not ready Evidence for depression or other mood disorder - none significant Most recent labs reviewed. I have personally reviewed and have noted: 1) the patient's medical and social history 2) The patient's current medications and supplements 3) The patient's height, weight, and BMI have been recorded in the chart

## 2023-01-15 NOTE — Assessment & Plan Note (Signed)
Last vitamin D Lab Results  Component Value Date   VD25OH 47.62 01/13/2023   Stable, cont oral replacement

## 2023-01-15 NOTE — Assessment & Plan Note (Signed)
Mild to mod, for depomedrol IM80 mg prednisone taper, for cxr, to f/u any worsening symptoms or concerns

## 2023-01-15 NOTE — Assessment & Plan Note (Signed)
Lab Results  Component Value Date   HGBA1C 8.6 (H) 01/13/2023   Uncontrolled, pt to continue current medical treatment toujeo 150 qd , ozempic 0.5 mg weekly,

## 2023-01-19 ENCOUNTER — Encounter: Payer: Self-pay | Admitting: Internal Medicine

## 2023-01-20 ENCOUNTER — Other Ambulatory Visit: Payer: Self-pay

## 2023-01-20 DIAGNOSIS — I10 Essential (primary) hypertension: Secondary | ICD-10-CM

## 2023-01-20 MED ORDER — CHLORTHALIDONE 25 MG PO TABS
25.0000 mg | ORAL_TABLET | Freq: Every day | ORAL | 1 refills | Status: AC
Start: 2023-01-20 — End: ?

## 2023-01-26 ENCOUNTER — Telehealth: Payer: Self-pay | Admitting: Internal Medicine

## 2023-01-26 NOTE — Telephone Encounter (Signed)
Patient and his wife called to discuss imaging results. They would like a call back at [phone].

## 2023-01-27 NOTE — Telephone Encounter (Signed)
Sorry, I dont call patients by phone, but I can answer any specific question they might have thanks

## 2023-01-28 NOTE — Telephone Encounter (Signed)
Called pt/wife clarified if questions was answered. She states she spoke w/ someone yesterday and they stated the xray was ok. Inform MD mail him a lab/xray letter, but everything was fine...Raechel Chute

## 2023-02-05 ENCOUNTER — Telehealth: Payer: Self-pay

## 2023-02-05 NOTE — Telephone Encounter (Signed)
Called [name] and advised that his [name] is here and ready to pick up

## 2023-02-10 NOTE — Telephone Encounter (Signed)
Patient came in to office today and picked up 10 boxes of patient assistance Toujeo. 

## 2023-02-22 ENCOUNTER — Other Ambulatory Visit: Payer: Self-pay | Admitting: Internal Medicine

## 2023-03-11 ENCOUNTER — Telehealth: Payer: Self-pay

## 2023-03-11 NOTE — Telephone Encounter (Signed)
I spoke to Mr Metthew and he is aware that his #5 boxes of Ozempic is here and ready to be picked up

## 2023-03-11 NOTE — Telephone Encounter (Signed)
Patient came in to office today and picked up 5 boxes of patient assistance Ozempic. 

## 2023-03-27 ENCOUNTER — Other Ambulatory Visit: Payer: Self-pay | Admitting: Internal Medicine

## 2023-04-10 ENCOUNTER — Ambulatory Visit: Payer: Medicare Other | Admitting: Podiatry

## 2023-04-10 DIAGNOSIS — E1151 Type 2 diabetes mellitus with diabetic peripheral angiopathy without gangrene: Secondary | ICD-10-CM

## 2023-04-10 DIAGNOSIS — B351 Tinea unguium: Secondary | ICD-10-CM

## 2023-04-10 DIAGNOSIS — L84 Corns and callosities: Secondary | ICD-10-CM

## 2023-04-10 DIAGNOSIS — M79675 Pain in left toe(s): Secondary | ICD-10-CM | POA: Diagnosis not present

## 2023-04-10 DIAGNOSIS — E1142 Type 2 diabetes mellitus with diabetic polyneuropathy: Secondary | ICD-10-CM

## 2023-04-10 DIAGNOSIS — M79674 Pain in right toe(s): Secondary | ICD-10-CM | POA: Diagnosis not present

## 2023-04-10 NOTE — Progress Notes (Signed)
  Subjective:  Patient ID: [name], male    DOB: 1956/03/29,  MRN: [id number]  Chief Complaint  Patient presents with   Nail Problem    Diabetic Foot Care-nail trim   Latest A1C-8.1 (3 months ago)   Callouses    Corn to right ball of foot near the 3rd toe.     67 y.o. male presents with the above complaint. History confirmed with patient. Patient presenting with pain related to dystrophic thickened elongated nails. Patient is unable to trim own nails related to nail dystrophy and/or mobility issues. Patient does does have a history of T2DM. Patient does have callus present located at the plantar aspect of the right forefoot causing pain.   Objective:  Physical Exam: warm, good capillary refill nail exam onychomycosis of the toenails, onycholysis, and dystrophic nails protective sensation absent, DP and PT pulses diminished Left Foot:  Pain with palpation of nails due to elongation and dystrophic growth.  Right Foot: Pain with palpation of nails due to elongation and dystrophic growth.  Hyperkeratotic lesion with central hyperkeratotic core plantar aspect of the right second metatarsal head.  Assessment:   1. Type 2 diabetes mellitus with diabetic polyneuropathy, unspecified whether long term insulin use (HCC)   2. Corns and callosities   3. Pain due to onychomycosis of toenails of both feet   4. Type II diabetes mellitus with peripheral circulatory disorder [facility])      Plan:  Patient was evaluated and treated and all questions answered.  #  DM2 with neuropathy -Recommend diabetic shoes and liners for the patient to prevent worsening preulcerative calluses from forming -Patient is scheduled for September 16 to be fitted for diabetic shoes and liners  #Hyperkeratotic lesions/pre ulcerative calluses present sub 2nd t metatarsal head on right foot All symptomatic hyperkeratoses x 1 separate lesions were safely debrided with a sterile #10 blade to patient's level of comfort  without incident. We discussed preventative and palliative care of these lesions including supportive and accommodative shoegear, padding, prefabricated and custom molded accommodative orthoses, use of a pumice stone and lotions/creams daily.  #Onychomycosis with pain  -Nails palliatively debrided as below. -Educated on self-care  Procedure: Nail Debridement Rationale: Pain Type of Debridement: manual, sharp debridement. Instrumentation: Nail nipper, rotary burr. Number of Nails: 10  Return in about 3 months (around 07/11/2023) for [facility].         Corinna Gab, DPM [facility] / [facility]

## 2023-04-20 ENCOUNTER — Other Ambulatory Visit: Payer: Self-pay | Admitting: Internal Medicine

## 2023-04-21 ENCOUNTER — Other Ambulatory Visit: Payer: Self-pay

## 2023-04-22 ENCOUNTER — Other Ambulatory Visit: Payer: Medicare Other

## 2023-04-24 ENCOUNTER — Telehealth: Payer: Self-pay

## 2023-04-24 NOTE — Telephone Encounter (Signed)
Left vm that [employer] patient assistance has arrived and can be picked up. 4 boxes

## 2023-04-27 ENCOUNTER — Ambulatory Visit: Payer: Medicare Other

## 2023-04-27 DIAGNOSIS — L84 Corns and callosities: Secondary | ICD-10-CM

## 2023-04-27 DIAGNOSIS — M2041 Other hammer toe(s) (acquired), right foot: Secondary | ICD-10-CM

## 2023-04-27 DIAGNOSIS — E1142 Type 2 diabetes mellitus with diabetic polyneuropathy: Secondary | ICD-10-CM

## 2023-04-27 DIAGNOSIS — I999 Unspecified disorder of circulatory system: Secondary | ICD-10-CM

## 2023-04-27 NOTE — Progress Notes (Signed)
Patient presents to the office today for diabetic shoe and insole measuring.  Patient was measured with brannock device to determine size and width for 1 pair of extra depth shoes and foam casted for 3 pair of insoles.   Documentation of medical necessity will be sent to patient's treating diabetic doctor to verify and sign.   Patient's diabetic provider: Shamleffer  Shoes and insoles will be ordered at that time and patient will be notified for an appointment for fitting when they arrive.   Shoe size (per patient): 11 Brannock measurement: 10 Patient shoe selection- Shoe choice:   [id number] / [id number]  Shoe size ordered: 11 [employer] / ABN signed  Addison Bailey CPed, CFo, CFm

## 2023-04-28 DIAGNOSIS — C679 Malignant neoplasm of bladder, unspecified: Secondary | ICD-10-CM | POA: Diagnosis not present

## 2023-04-28 DIAGNOSIS — R3912 Poor urinary stream: Secondary | ICD-10-CM | POA: Diagnosis not present

## 2023-04-28 NOTE — Telephone Encounter (Signed)
Patient came in to office today and picked up 4 boxes of patient assistance Ozempic.

## 2023-05-12 DIAGNOSIS — L821 Other seborrheic keratosis: Secondary | ICD-10-CM | POA: Diagnosis not present

## 2023-05-12 DIAGNOSIS — D485 Neoplasm of uncertain behavior of skin: Secondary | ICD-10-CM | POA: Diagnosis not present

## 2023-05-12 DIAGNOSIS — D2239 Melanocytic nevi of other parts of face: Secondary | ICD-10-CM | POA: Diagnosis not present

## 2023-05-13 ENCOUNTER — Other Ambulatory Visit: Payer: Self-pay | Admitting: Cardiovascular Disease

## 2023-05-13 DIAGNOSIS — I10 Essential (primary) hypertension: Secondary | ICD-10-CM

## 2023-05-15 ENCOUNTER — Other Ambulatory Visit: Payer: Self-pay | Admitting: Internal Medicine

## 2023-05-18 ENCOUNTER — Other Ambulatory Visit: Payer: Self-pay

## 2023-05-22 ENCOUNTER — Other Ambulatory Visit: Payer: Self-pay | Admitting: Internal Medicine

## 2023-05-24 NOTE — Progress Notes (Signed)
Shoes are in Appt set for 10/28 1pm auth submitted  Addison Bailey CPed, CFo, CFm

## 2023-06-08 ENCOUNTER — Ambulatory Visit (INDEPENDENT_AMBULATORY_CARE_PROVIDER_SITE_OTHER): Payer: Medicare Other

## 2023-06-08 DIAGNOSIS — I999 Unspecified disorder of circulatory system: Secondary | ICD-10-CM | POA: Diagnosis not present

## 2023-06-08 DIAGNOSIS — E1151 Type 2 diabetes mellitus with diabetic peripheral angiopathy without gangrene: Secondary | ICD-10-CM | POA: Diagnosis not present

## 2023-06-08 DIAGNOSIS — M2041 Other hammer toe(s) (acquired), right foot: Secondary | ICD-10-CM

## 2023-06-08 DIAGNOSIS — L84 Corns and callosities: Secondary | ICD-10-CM | POA: Diagnosis not present

## 2023-06-08 DIAGNOSIS — M2042 Other hammer toe(s) (acquired), left foot: Secondary | ICD-10-CM

## 2023-06-08 DIAGNOSIS — E1142 Type 2 diabetes mellitus with diabetic polyneuropathy: Secondary | ICD-10-CM | POA: Diagnosis not present

## 2023-06-08 NOTE — Progress Notes (Signed)

## 2023-06-10 ENCOUNTER — Ambulatory Visit: Payer: Medicare Other

## 2023-06-10 DIAGNOSIS — Z0000 Encounter for general adult medical examination without abnormal findings: Secondary | ICD-10-CM

## 2023-06-10 NOTE — Patient Instructions (Signed)
Mr. Schlund , Thank you for taking time to come for your Medicare Wellness Visit. I appreciate your ongoing commitment to your health goals. Please review the following plan we discussed and let me know if I can assist you in the future.   Screening recommendations/referrals: Colonoscopy: Education provided Recommended yearly ophthalmology/optometry visit for glaucoma screening and checkup Recommended yearly dental visit for hygiene and checkup  Vaccinations: Influenza vaccine: up to date Pneumococcal vaccine Tdap vaccine: Education provided Shingles vaccine: 1 of 2     Preventive Care 65 Years and Older, Male Preventive care refers to lifestyle choices and visits with your health care provider that can promote health and wellness. What does preventive care include? A yearly physical exam. This is also called an annual well check. Dental exams once or twice a year. Routine eye exams. Ask your health care provider how often you should have your eyes checked. Personal lifestyle choices, including: Daily care of your teeth and gums. Regular physical activity. Eating a healthy diet. Avoiding tobacco and drug use. Limiting alcohol use. Practicing safe sex. Taking low doses of aspirin every day. Taking vitamin and mineral supplements as recommended by your health care provider. What happens during an annual well check? The services and screenings done by your health care provider during your annual well check will depend on your age, overall health, lifestyle risk factors, and family history of disease. Counseling  Your health care provider may ask you questions about your: Alcohol use. Tobacco use. Drug use. Emotional well-being. Home and relationship well-being. Sexual activity. Eating habits. History of falls. Memory and ability to understand (cognition). Work and work Astronomer. Screening  You may have the following tests or measurements: Height, weight, and BMI. Blood  pressure. Lipid and cholesterol levels. These may be checked every 5 years, or more frequently if you are over 38 years old. Skin check. Lung cancer screening. You may have this screening every year starting at age 49 if you have a 30-pack-year history of smoking and currently smoke or have quit within the past 15 years. Fecal occult blood test (FOBT) of the stool. You may have this test every year starting at age 49. Flexible sigmoidoscopy or colonoscopy. You may have a sigmoidoscopy every 5 years or a colonoscopy every 10 years starting at age 36. Prostate cancer screening. Recommendations will vary depending on your family history and other risks. Hepatitis C blood test. Hepatitis B blood test. Sexually transmitted disease (STD) testing. Diabetes screening. This is done by checking your blood sugar (glucose) after you have not eaten for a while (fasting). You may have this done every 1-3 years. Abdominal aortic aneurysm (AAA) screening. You may need this if you are a current or former smoker. Osteoporosis. You may be screened starting at age 54 if you are at high risk. Talk with your health care provider about your test results, treatment options, and if necessary, the need for more tests. Vaccines  Your health care provider may recommend certain vaccines, such as: Influenza vaccine. This is recommended every year. Tetanus, diphtheria, and acellular pertussis (Tdap, Td) vaccine. You may need a Td booster every 10 years. Zoster vaccine. You may need this after age 24. Pneumococcal 13-valent conjugate (PCV13) vaccine. One dose is recommended after age 36. Pneumococcal polysaccharide (PPSV23) vaccine. One dose is recommended after age 46. Talk to your health care provider about which screenings and vaccines you need and how often you need them. This information is not intended to replace advice given to you  by your health care provider. Make sure you discuss any questions you have with your  health care provider. Document Released: 08/24/2015 Document Revised: 04/16/2016 Document Reviewed: 05/29/2015 Elsevier Interactive Patient Education  2017 [employer].  Fall Prevention in the Home Falls can cause injuries. They can happen to people of all ages. There are many things you can do to make your home safe and to help prevent falls. What can I do on the outside of my home? Regularly fix the edges of walkways and driveways and fix any cracks. Remove anything that might make you trip as you walk through a door, such as a raised step or threshold. Trim any bushes or trees on the path to your home. Use bright outdoor lighting. Clear any walking paths of anything that might make someone trip, such as rocks or tools. Regularly check to see if handrails are loose or broken. Make sure that both sides of any steps have handrails. Any raised decks and porches should have guardrails on the edges. Have any leaves, snow, or ice cleared regularly. Use sand or salt on walking paths during winter. Clean up any spills in your garage right away. This includes oil or grease spills. What can I do in the bathroom? Use night lights. Install grab bars by the toilet and in the tub and shower. Do not use towel bars as grab bars. Use non-skid mats or decals in the tub or shower. If you need to sit down in the shower, use a plastic, non-slip stool. Keep the floor dry. Clean up any water that spills on the floor as soon as it happens. Remove soap buildup in the tub or shower regularly. Attach bath mats securely with double-sided non-slip rug tape. Do not have throw rugs and other things on the floor that can make you trip. What can I do in the bedroom? Use night lights. Make sure that you have a light by your bed that is easy to reach. Do not use any sheets or blankets that are too big for your bed. They should not hang down onto the floor. Have a firm chair that has side arms. You can use this for  support while you get dressed. Do not have throw rugs and other things on the floor that can make you trip. What can I do in the kitchen? Clean up any spills right away. Avoid walking on wet floors. Keep items that you use a lot in easy-to-reach places. If you need to reach something above you, use a strong step stool that has a grab bar. Keep electrical cords out of the way. Do not use floor polish or wax that makes floors slippery. If you must use wax, use non-skid floor wax. Do not have throw rugs and other things on the floor that can make you trip. What can I do with my stairs? Do not leave any items on the stairs. Make sure that there are handrails on both sides of the stairs and use them. Fix handrails that are broken or loose. Make sure that handrails are as long as the stairways. Check any carpeting to make sure that it is firmly attached to the stairs. Fix any carpet that is loose or worn. Avoid having throw rugs at the top or bottom of the stairs. If you do have throw rugs, attach them to the floor with carpet tape. Make sure that you have a light switch at the top of the stairs and the bottom of the stairs. If  you do not have them, ask someone to add them for you. What else can I do to help prevent falls? Wear shoes that: Do not have high heels. Have rubber bottoms. Are comfortable and fit you well. Are closed at the toe. Do not wear sandals. If you use a stepladder: Make sure that it is fully opened. Do not climb a closed stepladder. Make sure that both sides of the stepladder are locked into place. Ask someone to hold it for you, if possible. Clearly mark and make sure that you can see: Any grab bars or handrails. First and last steps. Where the edge of each step is. Use tools that help you move around (mobility aids) if they are needed. These include: Canes. Walkers. Scooters. Crutches. Turn on the lights when you go into a dark area. Replace any light bulbs as soon  as they burn out. Set up your furniture so you have a clear path. Avoid moving your furniture around. If any of your floors are uneven, fix them. If there are any pets around you, be aware of where they are. Review your medicines with your doctor. Some medicines can make you feel dizzy. This can increase your chance of falling. Ask your doctor what other things that you can do to help prevent falls. This information is not intended to replace advice given to you by your health care provider. Make sure you discuss any questions you have with your health care provider. Document Released: 05/24/2009 Document Revised: 01/03/2016 Document Reviewed: 09/01/2014 Elsevier Interactive Patient Education  2017 [employer].

## 2023-06-10 NOTE — Progress Notes (Signed)
Subjective:   [name] is a 67 y.o. male who presents for Medicare Annual/Subsequent preventive examination.  Visit Complete: Virtual I connected with  AUSTINJAMES TREASE on 06/10/23 by a audio enabled telemedicine application and verified that I am speaking with the correct person using two identifiers.  Patient Location: Home  Provider Location: Home Office  I discussed the limitations of evaluation and management by telemedicine. The patient expressed understanding and agreed to proceed.  Vital Signs: Because this visit was a virtual/telehealth visit, some criteria may be missing or patient reported. Any vitals not documented were not able to be obtained and vitals that have been documented are patient reported.   Cardiac Risk Factors include: diabetes mellitus;advanced age (>46men, >20 women);male gender;hypertension;family history of premature cardiovascular disease     Objective:    There were no vitals filed for this visit. There is no height or weight on file to calculate BMI.     06/10/2023   11:11 AM 02/19/2021    2:12 PM 05/15/2020   11:26 AM 06/20/2019   11:13 AM 06/05/2019   12:00 PM 06/04/2019   11:14 PM 07/22/2018   12:55 PM  Advanced Directives  Does Patient Have a Medical Advance Directive? No No No No Yes Yes No  Type of Advance Directive     Healthcare Power of [employer] Power of Attorney   Does patient want to make changes to medical advance directive?    Yes (MAU/Ambulatory/Procedural Areas - Information given) No - Patient declined    Copy of Healthcare Power of Attorney in Chart?     No - copy requested    Would patient like information on creating a medical advance directive?  No - Patient declined No - Patient declined        Current Medications (verified) Outpatient Encounter Medications as of 06/10/2023  Medication Sig   aspirin EC 81 MG tablet Take 1 tablet (81 mg total) by mouth daily.   atorvastatin (LIPITOR) 40 MG tablet Take 1  tablet by mouth once daily   Blood Glucose Monitoring Suppl (ONE TOUCH ULTRA 2) w/Device KIT Use as directed to check blood sugars twice a day   chlorthalidone (HYGROTON) 25 MG tablet Take 1 tablet (25 mg total) by mouth daily. PLEASE CALL [phone] TO SCHEDULE 6 MONTH APPOINTMENT. THANK YOU.   citalopram (CELEXA) 20 MG tablet Take 1 tablet by mouth once daily   diltiazem (CARDIZEM CD) 360 MG 24 hr capsule Take 1 capsule by mouth once daily   irbesartan (AVAPRO) 300 MG tablet TAKE 1 TABLET BY MOUTH DAILY   Lancets (ONETOUCH DELICA PLUS LANCET30G) MISC CHECK BLOOD SUGAR TWICE  DAILY   loratadine (CLARITIN) 10 MG tablet Take 10 mg by mouth daily as needed.   ONETOUCH ULTRA test strip CHECK BLOOD SUGAR TWICE DAILY   pantoprazole (PROTONIX) 40 MG tablet TAKE 1 TABLET BY MOUTH DAILY   Semaglutide,0.25 or 0.5MG /DOS, (OZEMPIC, 0.25 OR 0.5 MG/DOSE,) 2 MG/3ML SOPN Inject 0.5 mg into the skin once a week.   spironolactone (ALDACTONE) 25 MG tablet Take 1 tablet (25 mg total) by mouth daily.   tamsulosin (FLOMAX) 0.4 MG CAPS capsule Take 0.4 mg by mouth at bedtime.   VITAMIN D, CHOLECALCIFEROL, PO Take 5,000 Units by mouth daily.   insulin glargine, 2 Unit Dial, (TOUJEO MAX SOLOSTAR) 300 UNIT/ML Solostar Pen Inject 150 Units into the skin every morning. (Patient taking differently: Inject 120 Units into the skin every morning.)   predniSONE (DELTASONE) 10  MG tablet 3 tabs by mouth per day for 3 days,2tabs per day for 3 days,1tab per day for 3 days   No facility-administered encounter medications on file as of 06/10/2023.    Allergies (verified) Cefepime, Metoprolol, and Myrbetriq [mirabegron]   History: Past Medical History:  Diagnosis Date   Adenomatous colon polyp    Allergic rhinitis    At risk for sleep apnea    STOP-BANG= 5   SENT TO PCP 03-14-2014   Bladder cancer (HCC)    CAD (coronary artery disease)    a. 07/2014 low risk MV; b. 07/2019 Cor CTA (FFR): LM 25-49 (nl), LAD mild prox/mid  plaque (nl), D1 25-49p(nl w/ abnl FFR of 0.73 in inf branch), LCX/OM1 mild prox/mid plaque (nl), RCA nondominant, minimal Ca2+ plaque (nl), RPDA (mildly abnl @ 0.79)-->Med Rx..   Cataract    surgically removed bilateral   Condyloma acuminatum of penis    Diabetic neuropathy (HCC)    Diastolic dysfunction    a. 05/2019 Echo: EF 60-65%, no rwam, mod LVH, impaired relaxation, nl RV size/fxn, trace MR, triv TR.   Diverticulosis    GERD (gastroesophageal reflux disease)    History of bladder cancer    s/p  turbt  2013/   transitional cell carcinoma--    History of condyloma acuminatum    PERINEAL AREA  W/ RECURRENCY   History of gout    Hyperlipidemia    Hypertension    Lower urinary tract symptoms (LUTS)    PAF (paroxysmal atrial fibrillation) (HCC)    a. 06/2019 Event monitor: PAF <1% burden. Longest 3 mins 36 secs.   Productive cough    PSVT (paroxysmal supraventricular tachycardia) (HCC)    a. 06/2019 Event monitor: 112 episodes of SVT, longest 21 secs.   PVD (peripheral vascular disease) with claudication (HCC)    a. 03/2014 LE art duplex: long segment occlusion of mid to distal R SFA; b. 03/2020 ABI: nl left and mildly improved R ABI->med rx.   Renal artery stenosis (HCC)    Renal artery stenosis (HCC)    a. 12/2020 Renal art duplex: RRA 1-59%, LRA >60%.   Smokers' cough (HCC)    Type 2 diabetes mellitus with insulin therapy (HCC) 1992   monitor by  dr ellsion   Wears dentures    upper   Past Surgical History:  Procedure Laterality Date   AXILLARY HIDRADENITIS EXCISION  1997   CARDIOVASCULAR STRESS TEST  07-24-2014  dr Lorine Bears   Low risk lexiscan nuclear study with apical thinning and small inferolateral wall infarct at mid & basal level , no ischemia/  normal LVF and wall motion , ef 59%   CATARACT EXTRACTION Left    CATARACT EXTRACTION W/ INTRAOCULAR LENS IMPLANT Right    CO2 LASER APPLICATION N/A 03/20/2014   Procedure: CO2 LASER APPLICATION,PENIS, GROIN, ANUS;   Surgeon: Ardeth Sportsman, MD;  Location: [facility];  Service: General;  Laterality: N/A;   CO2 LASER APPLICATION N/A 05/21/2015   Procedure: CO2 LASER APPLICATION;  Surgeon: Ihor Gully, MD;  Location: [facility];  Service: Urology;  Laterality: N/A;   CONDYLOMA EXCISION/FULGURATION N/A 05/21/2015   Procedure: CONDYLOMA REMOVAL;  Surgeon: Ihor Gully, MD;  Location: [facility];  Service: Urology;  Laterality: N/A;   HEMORRHOID SURGERY  10/24/2014   Procedure: HEMORRHOIDECTOMY;  Surgeon: Karie Soda, MD;  Location: [facility];  Service: General;;   INCISION AND DRAINAGE ABSCESS Left 10/24/2014   Procedure: INCISION  AND DRAINAGE ABSCESS;  Surgeon: Karie Soda, MD;  Location[facility];  Service: General;  Laterality: Left;   INGUINAL HIDRADENITIS EXCISION  1998, 1999   LASER ABLATION CONDOLAMATA N/A 03/20/2014   Procedure: EXAM UNDER ANESTHESIA, REMOVAL/ABLATION OF CONDYLOMATA PENIS,GROINS, ANUS, ANAL CANAL;  Surgeon: Ardeth Sportsman, MD;  Location: [facility];  Service: General;  Laterality: N/A;  groin and anus   LASER ABLATION CONDOLAMATA N/A 10/24/2014   Procedure: LASER ABLATION CONDOLAMATA;  Surgeon: Karie Soda, MD;  Location: [facility] [place];  Service: General;  Laterality: N/A;   LASER ABLATION OF PENILE AND PERIANAL WARTS  07-29-2007  Dr. Michaell Cowing   LEFT SHOULDER SURGERY  2003   MASS EXCISION N/A 10/24/2014   Procedure: EXCISION OF PERINEAL MASS/SINUS;  Surgeon: Karie Soda, MD;  Location: [facility];  Service: General;  Laterality: N/A;   MOHS SURGERY     back   MOHS SURGERY  2017   face   MULTIPLE TOOTH EXTRACTIONS     PERINEAL HIDRADENITIS EXCISION  1998, 1999   TRANSURETHRAL RESECTION OF BLADDER TUMOR  05/21/2012   Procedure: TRANSURETHRAL RESECTION OF BLADDER TUMOR (TURBT);  Surgeon: Garnett Farm, MD;  Location: [facility];  Service:  Urology;  Laterality: N/A;      Family History  Problem Relation Age of Onset   Hypertension Mother    Lung cancer Father    Diabetes Maternal Aunt        x 2   Colon cancer Neg Hx    Esophageal cancer Neg Hx    Pancreatic cancer Neg Hx    Prostate cancer Neg Hx    Kidney disease Neg Hx    Liver disease Neg Hx    Rectal cancer Neg Hx    Stomach cancer Neg Hx    Social History   Socioeconomic History   Marital status: Married    Spouse name: Not on file   Number of children: 0   Years of education: Not on file   Highest education level: Not on file  Occupational History   Occupation: Disabled/retired  Tobacco Use   Smoking status: Every Day    Current packs/day: 0.00    Average packs/day: 1 pack/day for 41.0 years (41.0 ttl pk-yrs)    Types: Cigars, Cigarettes    Start date: 23    Last attempt to quit: 2017    Years since quitting: 7.8   Smokeless tobacco: Never   Tobacco comments:    Quit cigarettes in 2017 but started back smoking Cigars  Vaping Use   Vaping status: Former  Substance and Sexual Activity   Alcohol use: No    Alcohol/week: 0.0 standard drinks of alcohol   Drug use: No   Sexual activity: Not Currently  Other Topics Concern   Not on file  Social History Narrative   Not on file   Social Determinants of Health   Financial Resource Strain: Low Risk  (06/10/2023)   Overall Financial Resource Strain (CARDIA)    Difficulty of Paying Living Expenses: Not very hard  Food Insecurity: No Food Insecurity (06/10/2023)   Hunger Vital Sign    Worried About Running Out of Food in the Last Year: Never true    Ran Out of Food in the Last Year: Never true  Transportation Needs: No Transportation Needs (06/10/2023)   PRAPARE - Administrator, Civil Service (Medical): No    Lack of Transportation (Non-Medical): No  Physical  Activity: Inactive (06/10/2023)   Exercise Vital Sign    Days of Exercise per Week: 0 days    Minutes of Exercise per  Session: 0 min  Stress: No Stress Concern Present (06/10/2023)   [employer] of Occupational Health - Occupational Stress Questionnaire    Feeling of Stress : Not at all  Social Connections: Socially Isolated (06/10/2023)   Social Connection and Isolation Panel [NHANES]    Frequency of Communication with Friends and Family: Never    Frequency of Social Gatherings with Friends and Family: Never    Attends Religious Services: Never    Database administrator or Organizations: No    Attends Engineer, structural: Never    Marital Status: Married    Tobacco Counseling Ready to quit: Not Answered Counseling given: Not Answered Tobacco comments: Quit cigarettes in 2017 but started back smoking Cigars   Clinical Intake:  Pre-visit preparation completed: Yes  Pain : No/denies pain     Diabetes: Yes CBG done?: No Did pt. bring in CBG monitor from home?: No  How often do you need to have someone help you when you read instructions, pamphlets, or other written materials from your doctor or pharmacy?: 1 - Never  Interpreter Needed?: No  Information entered by :: Remi Haggard LPN   Activities of Daily Living    06/10/2023   11:17 AM  In your present state of health, do you have any difficulty performing the following activities:  Hearing? 1  Vision? 0  Difficulty concentrating or making decisions? 0  Walking or climbing stairs? 0  Dressing or bathing? 0  Doing errands, shopping? 0  Preparing Food and eating ? N  Using the Toilet? N  In the past six months, have you accidently leaked urine? Y  Do you have problems with loss of bowel control? N  Managing your Medications? N  Managing your Finances? N  Housekeeping or managing your Housekeeping? N    Patient Care Team: Corwin Levins, MD as PCP - General (Internal Medicine) Iran Ouch, MD as PCP - Cardiology (Cardiology) Ihor Gully, MD (Inactive) as Consulting Physician (Urology) Iran Ouch,  MD as Consulting Physician (Cardiology) Karie Soda, MD as Consulting Physician (General Surgery) Albin Felling, OD (Optometry) Romero Belling, MD (Inactive) as Consulting Physician (Endocrinology) Szabat, Vinnie Level, [facility] (Inactive) as Pharmacist (Pharmacist)  Indicate any recent Medical Services you may have received from other than [facility] providers in the past year (date may be approximate).     Assessment:   This is a routine wellness examination for [facility].  Hearing/Vision screen Hearing Screening - Comments:: Trouble hearing R ear No hearing aids Vision Screening - Comments:: Up to date [facility]   Goals Addressed             This Visit's Progress    Patient Stated       Would like to keep weight off        Depression Screen    06/10/2023   11:19 AM 01/13/2023    1:22 PM 04/01/2022    1:21 PM 08/23/2021   12:59 PM 05/01/2021    2:27 PM 10/25/2020    1:56 PM 05/15/2020   11:38 AM  PHQ 2/9 Scores  PHQ - 2 Score 0 0  0 0 0 0  PHQ- 9 Score 1        Exception Documentation   Patient refusal        Fall Risk    06/10/2023  11:10 AM 01/13/2023    1:22 PM 04/01/2022    1:21 PM 08/23/2021    1:01 PM 05/01/2021    2:26 PM  Fall Risk   Falls in the past year? 0 0 0 0 0  Number falls in past yr: 0 0 0 0 0  Injury with Fall? 0 0 0 0 0  Risk for fall due to :  No Fall Risks No Fall Risks No Fall Risks   Follow up Falls evaluation completed;Education provided;Falls prevention discussed Falls evaluation completed Falls evaluation completed Falls evaluation completed     MEDICARE RISK AT HOME: Medicare Risk at Home Any stairs in or around the home?: Yes If so, are there any without handrails?: No Home free of loose throw rugs in walkways, pet beds, electrical cords, etc?: Yes Adequate lighting in your home to reduce risk of falls?: Yes Life alert?: No Use of a cane, walker or w/c?: No Grab bars in the bathroom?: Yes Shower chair or bench in shower?: No Elevated  toilet seat or a handicapped toilet?: No  TIMED UP AND GO:  Was the test performed?  No    Cognitive Function:        06/10/2023   11:13 AM  6CIT Screen  What Year? 0 points  What month? 3 points  What time? 0 points  Count back from 20 0 points  Months in reverse 0 points  Repeat phrase 0 points  Total Score 3 points    Immunizations Immunization History  Administered Date(s) Administered   Fluad Quad(high Dose 65+) 04/21/2019, 04/03/2023   Influenza Split 07/07/2011, 06/08/2012   Influenza Whole 05/04/2008, 05/22/2009, 05/02/2010   Influenza,inj,Quad PF,6+ Mos 06/06/2013, 05/05/2014, 07/02/2015, 05/05/2016, 06/15/2018, 04/26/2020, 05/01/2021   PFIZER(Purple Top)SARS-COV-2 Vaccination 11/23/2019, 12/14/2019   Pneumococcal Polysaccharide-23 01/10/1996, 05/05/2016   Td 02/09/2000   Tdap 02/10/2011, 06/05/2016   Zoster Recombinant(Shingrix) 04/03/2023    TDAP status: Up to date  Flu Vaccine status: Up to date  Pneumococcal vaccine status: Due, Education has been provided regarding the importance of this vaccine. Advised may receive this vaccine at local pharmacy or Health Dept. Aware to provide a copy of the vaccination record if obtained from local pharmacy or Health Dept. Verbalized acceptance and understanding.  Covid-19 vaccine status: Information provided on how to obtain vaccines.   Qualifies for Shingles Vaccine? Yes   Zostavax completed Yes   Shingrix Completed?: No.    Education has been provided regarding the importance of this vaccine. Patient has been advised to call insurance company to determine out of pocket expense if they have not yet received this vaccine. Advised may also receive vaccine at local pharmacy or Health Dept. Verbalized acceptance and understanding.  Screening Tests Health Maintenance  Topic Date Due   Zoster Vaccines- Shingrix (2 of 2) 09/10/2023 (Originally 05/29/2023)   Lung Cancer Screening  01/13/2024 (Originally 08/01/2020)    Pneumonia Vaccine 58+ Years old (2 of 2 - PCV) 06/09/2024 (Originally 05/05/2017)   HEMOGLOBIN A1C  07/15/2023   OPHTHALMOLOGY EXAM  11/29/2023   Diabetic kidney evaluation - eGFR measurement  01/13/2024   Diabetic kidney evaluation - Urine ACR  01/13/2024   FOOT EXAM  01/13/2024   Medicare Annual Wellness (AWV)  06/09/2024   Colonoscopy  03/24/2025   DTaP/Tdap/Td (4 - Td or Tdap) 06/05/2026   INFLUENZA VACCINE  Completed   Hepatitis C Screening  Completed   HPV VACCINES  Aged Out   COVID-19 Vaccine  Discontinued    Health Maintenance  There are no preventive care reminders to display for this patient.   Colorectal cancer screening: Type of screening: Colonoscopy. Completed 2023. Repeat every 3 years  Lung Cancer Screening: (Low Dose CT Chest recommended if Age 48-80 years, 20 pack-year currently smoking OR have quit w/in 15years.) does qualify.   Lung Cancer Screening Referral:   Additional Screening:  Hepatitis C Screening:  Completed 2017  Vision Screening: Recommended annual ophthalmology exams for early detection of glaucoma and other disorders of the eye. Is the patient up to date with their annual eye exam?  Yes  Who is the provider or what is the name of the office in which the patient attends annual eye exams? [facility] If pt is not established with a provider, would they like to be referred to a provider to establish care? No .   Dental Screening: Recommended annual dental exams for proper oral hygiene Nutrition Risk Assessment:  Has the patient had any N/V/D within the last 2 months?  Yes  Does the patient have any non-healing wounds?  No  Has the patient had any unintentional weight loss or weight gain?  Yes   Diabetes:  Is the patient diabetic?  No  If diabetic, was a CBG obtained today?  No  Did the patient bring in their glucometer from home?  No  How often do you monitor your CBG's? 1-2 x a week.   Financial Strains and Diabetes Management:  Are  you having any financial strains with the device, your supplies or your medication? No .  Does the patient want to be seen by Chronic Care Management for management of their diabetes?  No  Would the patient like to be referred to a Nutritionist or for Diabetic Management?  No   Diabetic Exams:  Diabetic Eye Exam: Completed . Pt has been advised about the importance in completing this exam.   Diabetic Foot Exam: Completed . Pt has been advised about the importance in completing this exam.    Community Resource Referral / Chronic Care Management: CRR required this visit?  No   CCM required this visit?  No     Plan:     I have personally reviewed and noted the following in the patient's chart:   Medical and social history Use of alcohol, tobacco or illicit drugs  Current medications and supplements including opioid prescriptions. Patient is not currently taking opioid prescriptions. Functional ability and status Nutritional status Physical activity Advanced directives List of other physicians Hospitalizations, surgeries, and ER visits in previous 12 months Vitals Screenings to include cognitive, depression, and falls Referrals and appointments  In addition, I have reviewed and discussed with patient certain preventive protocols, quality metrics, and best practice recommendations. A written personalized care plan for preventive services as well as general preventive health recommendations were provided to patient.     Remi Haggard, LPN   [id number]   After Visit Summary: (MyChart) Due to this being a telephonic visit, the after visit summary with patients personalized plan was offered to patient via MyChart   Nurse Notes:

## 2023-06-11 DIAGNOSIS — C44319 Basal cell carcinoma of skin of other parts of face: Secondary | ICD-10-CM | POA: Diagnosis not present

## 2023-06-17 DIAGNOSIS — E113293 Type 2 diabetes mellitus with mild nonproliferative diabetic retinopathy without macular edema, bilateral: Secondary | ICD-10-CM | POA: Diagnosis not present

## 2023-06-17 DIAGNOSIS — H5213 Myopia, bilateral: Secondary | ICD-10-CM | POA: Diagnosis not present

## 2023-06-17 LAB — HM DIABETES EYE EXAM

## 2023-07-03 ENCOUNTER — Other Ambulatory Visit: Payer: Self-pay | Admitting: Internal Medicine

## 2023-07-03 ENCOUNTER — Other Ambulatory Visit: Payer: Self-pay

## 2023-07-07 ENCOUNTER — Encounter: Payer: Self-pay | Admitting: Internal Medicine

## 2023-07-07 ENCOUNTER — Ambulatory Visit: Payer: Medicare Other | Admitting: Internal Medicine

## 2023-07-07 VITALS — BP 124/72 | HR 57 | Ht 68.0 in | Wt 207.0 lb

## 2023-07-07 DIAGNOSIS — Z794 Long term (current) use of insulin: Secondary | ICD-10-CM

## 2023-07-07 DIAGNOSIS — E1151 Type 2 diabetes mellitus with diabetic peripheral angiopathy without gangrene: Secondary | ICD-10-CM | POA: Diagnosis not present

## 2023-07-07 DIAGNOSIS — E1142 Type 2 diabetes mellitus with diabetic polyneuropathy: Secondary | ICD-10-CM | POA: Diagnosis not present

## 2023-07-07 LAB — POCT GLYCOSYLATED HEMOGLOBIN (HGB A1C): Hemoglobin A1C: 7.2 % — AB (ref 4.0–5.6)

## 2023-07-07 NOTE — Progress Notes (Signed)
Name: [name]  Age/ Sex: 67 y.o., male   MRN/ DOB: [id number], 1955-08-25     PCP: Corwin Levins, MD   Reason for Endocrinology Evaluation: Type 2 Diabetes Mellitus  Initial Endocrine Consultative Visit: 09/06/2012    PATIENT IDENTIFIER: [name] is a 67 y.o. male with a past medical history of T2DM, HTN and Hx of bladder cancer and CAD. The patient has followed with Endocrinology clinic since 09/06/2012 for consultative assistance with management of his diabetes.  DIABETIC HISTORY:  [name] was diagnosed with DM 1992.  He used to be on Metformin- diarrhea. His hemoglobin A1c has ranged from 6.9% in 2013, peaking at 10.9% in 2021  He attributes bladder cancer to actos    SUBJECTIVE:   During the last visit (01/02/2023): A1c 8.2%     Today (07/07/2023): [name]  is here for a follow up on diabetes management .He checks his blood sugars 1 times daily. The patient has had hypoglycemic episodes since the last clinic visit.  Patient is symptomatic with these episodes.    He is on pt assistance with Toujeo and Ozempic   Patient continues to follow-up with cardiology for peripheral artery disease, CAD, paroxysmal SVT, HTN and dyslipidemia   Wife under hospice for brain tumor, he has not been sleeping and has been dizzy he has not been eating  Denies nausea or vomiting Denies constipation or diarrhea   HOME DIABETES REGIMEN:  Toujeo 120 units daily  Ozempic 1 mg weekly ( Monday )      Statin: yes ACE-I/ARB: yes    METER DOWNLOAD SUMMARY: Unable to download [phone]     DIABETIC COMPLICATIONS: Microvascular complications:  Neuropathy Denies: CKD  Last Eye Exam: Completed 06/17/2023  Macrovascular complications:  CAD, PVD Denies: CVA   HISTORY:  Past Medical History:  Past Medical History:  Diagnosis Date   Adenomatous colon polyp    Allergic rhinitis    At risk for sleep apnea    STOP-BANG= 5   SENT TO PCP 03-14-2014   Bladder cancer  (HCC)    CAD (coronary artery disease)    a. 07/2014 low risk MV; b. 07/2019 Cor CTA (FFR): LM 25-49 (nl), LAD mild prox/mid plaque (nl), D1 25-49p(nl w/ abnl FFR of 0.73 in inf branch), LCX/OM1 mild prox/mid plaque (nl), RCA nondominant, minimal Ca2+ plaque (nl), RPDA (mildly abnl @ 0.79)-->Med Rx..   Cataract    surgically removed bilateral   Condyloma acuminatum of penis    Diabetic neuropathy (HCC)    Diastolic dysfunction    a. 05/2019 Echo: EF 60-65%, no rwam, mod LVH, impaired relaxation, nl RV size/fxn, trace MR, triv TR.   Diverticulosis    GERD (gastroesophageal reflux disease)    History of bladder cancer    s/p  turbt  2013/   transitional cell carcinoma--    History of condyloma acuminatum    PERINEAL AREA  W/ RECURRENCY   History of gout    Hyperlipidemia    Hypertension    Lower urinary tract symptoms (LUTS)    PAF (paroxysmal atrial fibrillation) (HCC)    a. 06/2019 Event monitor: PAF <1% burden. Longest 3 mins 36 secs.   Productive cough    PSVT (paroxysmal supraventricular tachycardia) (HCC)    a. 06/2019 Event monitor: 112 episodes of SVT, longest 21 secs.   PVD (peripheral vascular disease) with claudication (HCC)    a. 03/2014 LE art duplex: long segment occlusion of mid to distal R SFA;  b. 03/2020 ABI: nl left and mildly improved R ABI->med rx.   Renal artery stenosis (HCC)    Renal artery stenosis (HCC)    a. 12/2020 Renal art duplex: RRA 1-59%, LRA >60%.   Smokers' cough (HCC)    Type 2 diabetes mellitus with insulin therapy (HCC) 1992   monitor by  dr ellsion   Wears dentures    upper   Past Surgical History:  Past Surgical History:  Procedure Laterality Date   AXILLARY HIDRADENITIS EXCISION  1997   CARDIOVASCULAR STRESS TEST  07-24-2014  dr Lorine Bears   Low risk lexiscan nuclear study with apical thinning and small inferolateral wall infarct at mid & basal level , no ischemia/  normal LVF and wall motion , ef 59%   CATARACT EXTRACTION Left     CATARACT EXTRACTION W/ INTRAOCULAR LENS IMPLANT Right    CO2 LASER APPLICATION N/A 03/20/2014   Procedure: CO2 LASER APPLICATION,PENIS, GROIN, ANUS;  Surgeon: Ardeth Sportsman, MD;  Location: [facility];  Service: General;  Laterality: N/A;   CO2 LASER APPLICATION N/A 05/21/2015   Procedure: CO2 LASER APPLICATION;  Surgeon: Ihor Gully, MD;  Location: [facility];  Service: Urology;  Laterality: N/A;   CONDYLOMA EXCISION/FULGURATION N/A 05/21/2015   Procedure: CONDYLOMA REMOVAL;  Surgeon: Ihor Gully, MD;  Location: [facility];  Service: Urology;  Laterality: N/A;   HEMORRHOID SURGERY  10/24/2014   Procedure: HEMORRHOIDECTOMY;  Surgeon: Karie Soda, MD;  Location: [facility];  Service: General;;   INCISION AND DRAINAGE ABSCESS Left 10/24/2014   Procedure: INCISION AND DRAINAGE ABSCESS;  Surgeon: Karie Soda, MD;  Location: [facility];  Service: General;  Laterality: Left;   INGUINAL HIDRADENITIS EXCISION  1998, 1999   LASER ABLATION CONDOLAMATA N/A 03/20/2014   Procedure: EXAM UNDER ANESTHESIA, REMOVAL/ABLATION OF CONDYLOMATA PENIS,GROINS, ANUS, ANAL CANAL;  Surgeon: Ardeth Sportsman, MD;  Location: [facility];  Service: General;  Laterality: N/A;  groin and anus   LASER ABLATION CONDOLAMATA N/A 10/24/2014   Procedure: LASER ABLATION CONDOLAMATA;  Surgeon: Karie Soda, MD;  Location: [facility];  Service: General;  Laterality: N/A;   LASER ABLATION OF PENILE AND PERIANAL WARTS  07-29-2007  Dr. Michaell Cowing   LEFT SHOULDER SURGERY  2003   MASS EXCISION N/A 10/24/2014   Procedure: EXCISION OF PERINEAL MASS/SINUS;  Surgeon: Karie Soda, MD;  Location: [facility] ;  Service: General;  Laterality: N/A;   MOHS SURGERY     back   MOHS SURGERY  2017   face   MULTIPLE TOOTH EXTRACTIONS     PERINEAL HIDRADENITIS EXCISION  1998, 1999   TRANSURETHRAL RESECTION OF BLADDER TUMOR   05/21/2012   Procedure: TRANSURETHRAL RESECTION OF BLADDER TUMOR (TURBT);  Surgeon: Garnett Farm, MD;  Location: [facility];  Service: Urology;  Laterality: N/A;      Social History:  reports that he has been smoking cigars and cigarettes. He started smoking about 48 years ago. He has a 41 pack-year smoking history. He has never used smokeless tobacco. He reports that he does not drink alcohol and does not use drugs. Family History:  Family History  Problem Relation Age of Onset   Hypertension Mother    Lung cancer Father    Diabetes Maternal Aunt        x 2   Colon cancer Neg Hx    Esophageal cancer Neg Hx    Pancreatic cancer  Neg Hx    Prostate cancer Neg Hx    Kidney disease Neg Hx    Liver disease Neg Hx    Rectal cancer Neg Hx    Stomach cancer Neg Hx      HOME MEDICATIONS: Allergies as of 07/07/2023       Reactions   Cefepime Hives   Had allergic reaction to one of these 3 agents, unclear which one   Metoprolol Hives   Had allergic reaction to one of these 3 agents, unclear which one *Per RN, highly likely this is the cause of allergic rxn   Myrbetriq [mirabegron] Hives   Had allergic reaction to one of these 3 agents, unclear which one        Medication List        Accurate as of July 07, 2023  3:09 PM. If you have any questions, ask your nurse or doctor.          aspirin EC 81 MG tablet Take 1 tablet (81 mg total) by mouth daily.   atorvastatin 40 MG tablet Commonly known as: LIPITOR Take 1 tablet by mouth once daily   chlorthalidone 25 MG tablet Commonly known as: HYGROTON Take 1 tablet (25 mg total) by mouth daily. PLEASE CALL [phone] TO SCHEDULE 6 MONTH APPOINTMENT. THANK YOU.   citalopram 20 MG tablet Commonly known as: CELEXA Take 1 tablet by mouth once daily   diltiazem 360 MG 24 hr capsule Commonly known as: CARDIZEM CD Take 1 capsule by mouth once daily   Fluzone High-Dose 0.5 ML injection Generic drug:  Influenza vac split quadrivalent PF   irbesartan 300 MG tablet Commonly known as: AVAPRO TAKE 1 TABLET BY MOUTH DAILY   loratadine 10 MG tablet Commonly known as: CLARITIN Take 10 mg by mouth daily as needed.   ONE TOUCH ULTRA 2 w/Device Kit Use as directed to check blood sugars twice a day   OneTouch Delica Plus Lancet30G Misc CHECK BLOOD SUGAR TWICE  DAILY   OneTouch Ultra test strip Generic drug: glucose blood CHECK BLOOD SUGAR TWICE DAILY   Ozempic (0.25 or 0.5 MG/DOSE) 2 MG/3ML Sopn Generic drug: Semaglutide(0.25 or 0.5MG /DOS) Inject 0.5 mg into the skin once a week.   pantoprazole 40 MG tablet Commonly known as: PROTONIX TAKE 1 TABLET BY MOUTH DAILY   predniSONE 10 MG tablet Commonly known as: DELTASONE 3 tabs by mouth per day for 3 days,2tabs per day for 3 days,1tab per day for 3 days   spironolactone 25 MG tablet Commonly known as: ALDACTONE Take 1 tablet (25 mg total) by mouth daily.   tamsulosin 0.4 MG Caps capsule Commonly known as: FLOMAX Take 0.4 mg by mouth at bedtime.   Toujeo Max SoloStar 300 UNIT/ML Solostar Pen Generic drug: insulin glargine (2 Unit Dial) Inject 150 Units into the skin every morning. What changed: how much to take   VITAMIN D (CHOLECALCIFEROL) PO Take 5,000 Units by mouth daily.         OBJECTIVE:   Vital Signs: BP 124/72 (BP Location: Left Arm, Patient Position: Sitting, Cuff Size: Large)   Pulse (!) 57   Ht 5\' 8"  (1.727 m)   Wt 207 lb (93.9 kg)   SpO2 96%   BMI 31.47 kg/m   Wt Readings from Last 3 Encounters:  07/07/23 207 lb (93.9 kg)  01/13/23 215 lb (97.5 kg)  01/06/23 216 lb 8 oz (98.2 kg)     Exam: General: Pt appears well and is in NAD  Lungs: Clear with good BS bilat   Heart: RRR  Extremities: No pretibial edema.   Neuro: MS is good with appropriate affect, pt is alert and Ox3    DM Foot Exam 04/10/2023 per podiatry     DATA REVIEWED:  Lab Results  Component Value Date   HGBA1C 7.2 (A)  07/07/2023   HGBA1C 8.6 (H) 01/13/2023   HGBA1C 8.2 (A) 01/02/2023    Latest Reference Range & Units 01/13/23 14:14  Sodium 135 - 145 mEq/L 139  Potassium 3.5 - 5.1 mEq/L 4.0  Chloride 96 - 112 mEq/L 99  CO2 19 - 32 mEq/L 30  Glucose 70 - 99 mg/dL 161 (H)  BUN 6 - 23 mg/dL 19  Creatinine 0.96 - 0.45 mg/dL 4.09  Calcium 8.4 - 81.1 mg/dL 9.5  Alkaline Phosphatase 39 - 117 U/L 78  Albumin 3.5 - 5.2 g/dL 4.2  AST 0 - 37 U/L 21  ALT 0 - 53 U/L 28  Total Protein 6.0 - 8.3 g/dL 7.6  Bilirubin, Direct 0.0 - 0.3 mg/dL 0.2  Total Bilirubin 0.2 - 1.2 mg/dL 0.7  GFR >91.47 mL/min 65.74    Latest Reference Range & Units 01/13/23 14:14  Total CHOL/HDL Ratio  3  Cholesterol 0 - 200 mg/dL 97  HDL Cholesterol >82.95 mg/dL 62.13 (L)  LDL (calc) 0 - 99 mg/dL 41  MICROALB/CREAT RATIO 0.0 - 30.0 mg/g 0.9  NonHDL  66.68  Triglycerides 0.0 - 149.0 mg/dL 086.5  VLDL 0.0 - 78.4 mg/dL 69.6  (L): Data is abnormally low  Old records , labs and images have been reviewed.    ASSESSMENT / PLAN / RECOMMENDATIONS:   1) Type 2 Diabetes Mellitus, optimally controlled, With macrovascular and neuropathic complications - Most recent A1c of 7.2 %. Goal A1c < 7.0 %.    -A1c has trended down from 8.2% -He continues to have occasional hypoglycemia, will reduce insulin -Patient on patient assistance program for Toujeo and Ozempic -He is intolerant to Victoza 1.8 mg -Intolerant to metformin -Patient attributes bladder cancer to pioglitazone use -Not a candidate for SGLT2 inhibitors at this time due to history of bladder cancer and increased risk of infection    MEDICATIONS: Decrease Toujeo to 100 units daily Continue Ozempic 1 mg weekly  EDUCATION / INSTRUCTIONS: BG monitoring instructions: Patient is instructed to check his blood sugars 1 times a day, fasting. Call [facility] if: BG persistently < 70 I reviewed the Rule of 15 for the treatment of hypoglycemia in detail with the  patient. Literature supplied.   2) Diabetic complications:  Eye: Does not have known diabetic retinopathy.  Neuro/ Feet: Does not have known diabetic peripheral neuropathy .  Renal: Patient does not have known baseline CKD. He   is  on an ACEI/ARB at present.      F/U in 6 months     Signed electronically by: Lyndle Herrlich, MD  [facility]  [facility] [address] [place], [place] [address] Phone: [phone] FAX: [phone]   CC: Corwin Levins, MD [address] [place] [address] Phone: [phone]  Fax: [phone]  Return to Endocrinology clinic as below: Future Appointments  Date Time Provider Department Center  07/15/2023  1:20 PM Corwin Levins, MD [facility] None

## 2023-07-07 NOTE — Patient Instructions (Signed)
Decrease Toujeo 100 units daily  Continue  Ozempic 1 mg weekly     HOW TO TREAT LOW BLOOD SUGARS (Blood sugar LESS THAN 70 MG/DL) Please follow the RULE OF 15 for the treatment of hypoglycemia treatment (when your (blood sugars are less than 70 mg/dL)   STEP 1: Take 15 grams of carbohydrates when your blood sugar is low, which includes:  3-4 GLUCOSE TABS  OR 3-4 OZ OF JUICE OR REGULAR SODA OR ONE TUBE OF GLUCOSE GEL    STEP 2: RECHECK blood sugar in 15 MINUTES STEP 3: If your blood sugar is still low at the 15 minute recheck --> then, go back to STEP 1 and treat AGAIN with another 15 grams of carbohydrates.

## 2023-07-15 ENCOUNTER — Ambulatory Visit: Payer: Medicare Other | Admitting: Internal Medicine

## 2023-07-28 ENCOUNTER — Ambulatory Visit: Payer: Medicare Other | Admitting: Internal Medicine

## 2023-08-04 ENCOUNTER — Ambulatory Visit: Payer: Medicare Other | Admitting: Internal Medicine

## 2023-08-23 ENCOUNTER — Other Ambulatory Visit: Payer: Self-pay | Admitting: Nurse Practitioner

## 2023-08-24 NOTE — Telephone Encounter (Signed)
 Please contact pt for future appointment. Pt due for 6 month f/u.

## 2023-08-28 ENCOUNTER — Ambulatory Visit: Payer: Medicare Other | Admitting: Nurse Practitioner

## 2023-09-01 DIAGNOSIS — D485 Neoplasm of uncertain behavior of skin: Secondary | ICD-10-CM | POA: Diagnosis not present

## 2023-09-01 DIAGNOSIS — C44319 Basal cell carcinoma of skin of other parts of face: Secondary | ICD-10-CM | POA: Diagnosis not present

## 2023-09-03 ENCOUNTER — Other Ambulatory Visit: Payer: Self-pay | Admitting: Cardiovascular Disease

## 2023-09-03 DIAGNOSIS — I10 Essential (primary) hypertension: Secondary | ICD-10-CM

## 2023-09-03 NOTE — Telephone Encounter (Signed)
Hi,  Will you please outreach patient to schedule OD 6 month follow up.  Thank you,

## 2023-09-03 NOTE — Telephone Encounter (Signed)
LVM to schedule f/u appt

## 2023-09-10 ENCOUNTER — Other Ambulatory Visit: Payer: Self-pay | Admitting: Internal Medicine

## 2023-09-10 ENCOUNTER — Other Ambulatory Visit: Payer: Self-pay

## 2023-09-14 ENCOUNTER — Telehealth: Payer: Self-pay

## 2023-09-14 ENCOUNTER — Telehealth: Payer: Self-pay | Admitting: Internal Medicine

## 2023-09-14 NOTE — Telephone Encounter (Signed)
Pt assistance left in the fridge Shipped 04/2023  Patient will be coming by to pick this up.   4 boxes

## 2023-09-14 NOTE — Telephone Encounter (Signed)
 Called and left voice mail

## 2023-09-14 NOTE — Telephone Encounter (Unsigned)
Copied from CRM [id number]. Topic: Referral - Question >> Sep 14, 2023 11:51 AM Lennart Pall wrote: Reason for CRM: Patient looking to get an authorization for [facility].  [phone] -Direct Department for Prior Authorization

## 2023-09-17 NOTE — Telephone Encounter (Signed)
 Patient picked up patient assistance - Log Noted

## 2023-12-29 ENCOUNTER — Emergency Department (HOSPITAL_COMMUNITY)

## 2023-12-29 ENCOUNTER — Inpatient Hospital Stay (HOSPITAL_COMMUNITY)
Admission: EM | Admit: 2023-12-29 | Discharge: 2024-01-02 | DRG: 854 | Disposition: A | Discharge status: Home / Self Care | Attending: Internal Medicine | Admitting: Internal Medicine

## 2023-12-29 ENCOUNTER — Encounter (HOSPITAL_COMMUNITY): Payer: Self-pay

## 2023-12-29 ENCOUNTER — Other Ambulatory Visit: Payer: Self-pay

## 2023-12-29 DIAGNOSIS — I10 Essential (primary) hypertension: Secondary | ICD-10-CM | POA: Diagnosis present

## 2023-12-29 DIAGNOSIS — M86172 Other acute osteomyelitis, left ankle and foot: Secondary | ICD-10-CM

## 2023-12-29 DIAGNOSIS — Z8249 Family history of ischemic heart disease and other diseases of the circulatory system: Secondary | ICD-10-CM

## 2023-12-29 DIAGNOSIS — E1169 Type 2 diabetes mellitus with other specified complication: Secondary | ICD-10-CM | POA: Diagnosis not present

## 2023-12-29 DIAGNOSIS — F17290 Nicotine dependence, other tobacco product, uncomplicated: Secondary | ICD-10-CM | POA: Diagnosis not present

## 2023-12-29 DIAGNOSIS — I70245 Atherosclerosis of native arteries of left leg with ulceration of other part of foot: Secondary | ICD-10-CM | POA: Diagnosis not present

## 2023-12-29 DIAGNOSIS — E119 Type 2 diabetes mellitus without complications: Secondary | ICD-10-CM | POA: Diagnosis not present

## 2023-12-29 DIAGNOSIS — I70262 Atherosclerosis of native arteries of extremities with gangrene, left leg: Secondary | ICD-10-CM | POA: Diagnosis present

## 2023-12-29 DIAGNOSIS — Z79899 Other long term (current) drug therapy: Secondary | ICD-10-CM | POA: Diagnosis not present

## 2023-12-29 DIAGNOSIS — E1151 Type 2 diabetes mellitus with diabetic peripheral angiopathy without gangrene: Secondary | ICD-10-CM

## 2023-12-29 DIAGNOSIS — Z5912 Inadequate housing utilities: Secondary | ICD-10-CM | POA: Diagnosis not present

## 2023-12-29 DIAGNOSIS — E785 Hyperlipidemia, unspecified: Secondary | ICD-10-CM | POA: Diagnosis present

## 2023-12-29 DIAGNOSIS — Z91148 Patient's other noncompliance with medication regimen for other reason: Secondary | ICD-10-CM

## 2023-12-29 DIAGNOSIS — E876 Hypokalemia: Secondary | ICD-10-CM | POA: Diagnosis not present

## 2023-12-29 DIAGNOSIS — E1152 Type 2 diabetes mellitus with diabetic peripheral angiopathy with gangrene: Secondary | ICD-10-CM | POA: Diagnosis present

## 2023-12-29 DIAGNOSIS — B351 Tinea unguium: Secondary | ICD-10-CM | POA: Diagnosis not present

## 2023-12-29 DIAGNOSIS — I2510 Atherosclerotic heart disease of native coronary artery without angina pectoris: Secondary | ICD-10-CM | POA: Diagnosis present

## 2023-12-29 DIAGNOSIS — L97529 Non-pressure chronic ulcer of other part of left foot with unspecified severity: Secondary | ICD-10-CM | POA: Diagnosis not present

## 2023-12-29 DIAGNOSIS — Z5982 Transportation insecurity: Secondary | ICD-10-CM

## 2023-12-29 DIAGNOSIS — Z794 Long term (current) use of insulin: Secondary | ICD-10-CM

## 2023-12-29 DIAGNOSIS — A419 Sepsis, unspecified organism: Secondary | ICD-10-CM | POA: Diagnosis not present

## 2023-12-29 DIAGNOSIS — Z5948 Other specified lack of adequate food: Secondary | ICD-10-CM

## 2023-12-29 DIAGNOSIS — Z7982 Long term (current) use of aspirin: Secondary | ICD-10-CM | POA: Diagnosis not present

## 2023-12-29 DIAGNOSIS — J45909 Unspecified asthma, uncomplicated: Secondary | ICD-10-CM | POA: Diagnosis not present

## 2023-12-29 DIAGNOSIS — L03032 Cellulitis of left toe: Secondary | ICD-10-CM | POA: Diagnosis present

## 2023-12-29 DIAGNOSIS — E11621 Type 2 diabetes mellitus with foot ulcer: Secondary | ICD-10-CM | POA: Diagnosis not present

## 2023-12-29 DIAGNOSIS — E1140 Type 2 diabetes mellitus with diabetic neuropathy, unspecified: Secondary | ICD-10-CM | POA: Diagnosis present

## 2023-12-29 DIAGNOSIS — K529 Noninfective gastroenteritis and colitis, unspecified: Secondary | ICD-10-CM | POA: Diagnosis not present

## 2023-12-29 DIAGNOSIS — I7090 Unspecified atherosclerosis: Secondary | ICD-10-CM | POA: Diagnosis not present

## 2023-12-29 DIAGNOSIS — L0390 Cellulitis, unspecified: Secondary | ICD-10-CM

## 2023-12-29 DIAGNOSIS — Z634 Disappearance and death of family member: Secondary | ICD-10-CM | POA: Diagnosis not present

## 2023-12-29 DIAGNOSIS — A040 Enteropathogenic Escherichia coli infection: Secondary | ICD-10-CM | POA: Diagnosis present

## 2023-12-29 DIAGNOSIS — K219 Gastro-esophageal reflux disease without esophagitis: Secondary | ICD-10-CM | POA: Diagnosis present

## 2023-12-29 DIAGNOSIS — F17210 Nicotine dependence, cigarettes, uncomplicated: Secondary | ICD-10-CM | POA: Diagnosis present

## 2023-12-29 DIAGNOSIS — Z604 Social exclusion and rejection: Secondary | ICD-10-CM | POA: Diagnosis not present

## 2023-12-29 DIAGNOSIS — R600 Localized edema: Secondary | ICD-10-CM | POA: Diagnosis not present

## 2023-12-29 DIAGNOSIS — Z89422 Acquired absence of other left toe(s): Secondary | ICD-10-CM | POA: Diagnosis not present

## 2023-12-29 DIAGNOSIS — I480 Paroxysmal atrial fibrillation: Secondary | ICD-10-CM | POA: Diagnosis present

## 2023-12-29 DIAGNOSIS — M79673 Pain in unspecified foot: Secondary | ICD-10-CM | POA: Diagnosis not present

## 2023-12-29 DIAGNOSIS — I70201 Unspecified atherosclerosis of native arteries of extremities, right leg: Secondary | ICD-10-CM | POA: Diagnosis not present

## 2023-12-29 DIAGNOSIS — Z5941 Food insecurity: Secondary | ICD-10-CM

## 2023-12-29 DIAGNOSIS — B9629 Other Escherichia coli [E. coli] as the cause of diseases classified elsewhere: Secondary | ICD-10-CM | POA: Diagnosis not present

## 2023-12-29 DIAGNOSIS — I96 Gangrene, not elsewhere classified: Secondary | ICD-10-CM | POA: Diagnosis not present

## 2023-12-29 DIAGNOSIS — E860 Dehydration: Secondary | ICD-10-CM | POA: Diagnosis not present

## 2023-12-29 DIAGNOSIS — M7989 Other specified soft tissue disorders: Secondary | ICD-10-CM | POA: Diagnosis not present

## 2023-12-29 DIAGNOSIS — M869 Osteomyelitis, unspecified: Secondary | ICD-10-CM | POA: Diagnosis not present

## 2023-12-29 DIAGNOSIS — M19072 Primary osteoarthritis, left ankle and foot: Secondary | ICD-10-CM | POA: Diagnosis not present

## 2023-12-29 DIAGNOSIS — Z8551 Personal history of malignant neoplasm of bladder: Secondary | ICD-10-CM

## 2023-12-29 DIAGNOSIS — Z881 Allergy status to other antibiotic agents status: Secondary | ICD-10-CM

## 2023-12-29 DIAGNOSIS — E1165 Type 2 diabetes mellitus with hyperglycemia: Secondary | ICD-10-CM | POA: Diagnosis not present

## 2023-12-29 DIAGNOSIS — R739 Hyperglycemia, unspecified: Secondary | ICD-10-CM | POA: Diagnosis not present

## 2023-12-29 DIAGNOSIS — R197 Diarrhea, unspecified: Secondary | ICD-10-CM | POA: Diagnosis not present

## 2023-12-29 DIAGNOSIS — M7732 Calcaneal spur, left foot: Secondary | ICD-10-CM | POA: Diagnosis not present

## 2023-12-29 LAB — BASIC METABOLIC PANEL WITH GFR
Anion gap: 15 (ref 5–15)
Anion gap: 12 (ref 5–15)
BUN: 26 mg/dL — ABNORMAL HIGH (ref 8–23)
BUN: 23 mg/dL (ref 8–23)
CO2: 25 mmol/L (ref 22–32)
CO2: 28 mmol/L (ref 22–32)
Calcium: 9.4 mg/dL (ref 8.9–10.3)
Calcium: 9.4 mg/dL (ref 8.9–10.3)
Chloride: 83 mmol/L — ABNORMAL LOW (ref 98–111)
Chloride: 92 mmol/L — ABNORMAL LOW (ref 98–111)
Creatinine, Ser: 1.21 mg/dL (ref 0.61–1.24)
Creatinine, Ser: 1.02 mg/dL (ref 0.61–1.24)
GFR, Estimated: >60 mL/min (ref >60)
GFR, Estimated: >60 mL/min (ref >60)
Glucose, Bld: 658 mg/dL (ref 70–99)
Glucose, Bld: 150 mg/dL — ABNORMAL HIGH (ref 70–99)
Potassium: 4 mmol/L (ref 3.5–5.1)
Potassium: 3.2 mmol/L — ABNORMAL LOW (ref 3.5–5.1)
Sodium: 123 mmol/L — ABNORMAL LOW (ref 135–145)
Sodium: 132 mmol/L — ABNORMAL LOW (ref 135–145)

## 2023-12-29 LAB — CBC WITH DIFFERENTIAL/PLATELET
Abs Immature Granulocytes: 0.1 10*3/uL — ABNORMAL HIGH (ref 0.00–0.07)
Basophils Absolute: 0.1 10*3/uL (ref 0.0–0.1)
Basophils Relative: 1 %
Eosinophils Absolute: 0.1 10*3/uL (ref 0.0–0.5)
Eosinophils Relative: 1 %
HCT: 44.6 % (ref 39.0–52.0)
Hemoglobin: 16.5 g/dL (ref 13.0–17.0)
Immature Granulocytes: 1 %
Lymphocytes Relative: 13 %
Lymphs Abs: 1.9 10*3/uL (ref 0.7–4.0)
MCH: 33.3 pg (ref 26.0–34.0)
MCHC: 37 g/dL — ABNORMAL HIGH (ref 30.0–36.0)
MCV: 89.9 fL (ref 80.0–100.0)
Monocytes Absolute: 1 10*3/uL (ref 0.1–1.0)
Monocytes Relative: 7 %
Neutro Abs: 11.8 10*3/uL — ABNORMAL HIGH (ref 1.7–7.7)
Neutrophils Relative %: 77 %
Platelets: 270 10*3/uL (ref 150–400)
RBC: 4.96 MIL/uL (ref 4.22–5.81)
RDW: 12.6 % (ref 11.5–15.5)
WBC: 15 10*3/uL — ABNORMAL HIGH (ref 4.0–10.5)
nRBC: 0 % (ref 0.0–0.2)

## 2023-12-29 LAB — CBG MONITORING, ED
Glucose-Capillary: 101 mg/dL — ABNORMAL HIGH (ref 70–99)
Glucose-Capillary: 144 mg/dL — ABNORMAL HIGH (ref 70–99)
Glucose-Capillary: 368 mg/dL — ABNORMAL HIGH (ref 70–99)
Glucose-Capillary: 448 mg/dL — ABNORMAL HIGH (ref 70–99)
Glucose-Capillary: >600 mg/dL (ref 70–99)

## 2023-12-29 LAB — URINALYSIS, ROUTINE W REFLEX MICROSCOPIC
Bilirubin Urine: NEGATIVE
Glucose, UA: >=500 mg/dL — AB
Hgb urine dipstick: NEGATIVE
Ketones, ur: 5 mg/dL — AB
Nitrite: NEGATIVE
Protein, ur: NEGATIVE mg/dL
Specific Gravity, Urine: 1.021 (ref 1.005–1.030)
pH: 6 (ref 5.0–8.0)

## 2023-12-29 LAB — BETA-HYDROXYBUTYRIC ACID
Beta-Hydroxybutyric Acid: 0.83 mmol/L — ABNORMAL HIGH (ref 0.05–0.27)
Beta-Hydroxybutyric Acid: 0.53 mmol/L — ABNORMAL HIGH (ref 0.05–0.27)

## 2023-12-29 LAB — BLOOD GAS, VENOUS
Acid-Base Excess: 4.7 mmol/L — ABNORMAL HIGH (ref 0.0–2.0)
Bicarbonate: 29.2 mmol/L — ABNORMAL HIGH (ref 20.0–28.0)
O2 Saturation: 49.9 %
Patient temperature: 37
pCO2, Ven: 42 mmHg — ABNORMAL LOW (ref 44–60)
pH, Ven: 7.45 — ABNORMAL HIGH (ref 7.25–7.43)
pO2, Ven: <31 mmHg — CL (ref 32–45)

## 2023-12-29 LAB — LACTIC ACID, PLASMA: Lactic Acid, Venous: 4 mmol/L (ref 0.5–1.9)

## 2023-12-29 MED ORDER — LACTATED RINGERS IV BOLUS
1000.0000 mL | Freq: Once | INTRAVENOUS | Status: AC
  Administered 2023-12-29: 1000 mL via INTRAVENOUS

## 2023-12-29 MED ORDER — LACTATED RINGERS IV BOLUS
1000.0000 mL | Freq: Once | INTRAVENOUS | Status: AC
Start: 2023-12-29 — End: 2023-12-30
  Administered 2023-12-30: 1000 mL via INTRAVENOUS

## 2023-12-29 MED ORDER — ALBUTEROL SULFATE (2.5 MG/3ML) 0.083% IN NEBU: 2.5000 mg | INHALATION_SOLUTION | RESPIRATORY_TRACT | Status: DC | PRN

## 2023-12-29 MED ORDER — ACETAMINOPHEN 650 MG RE SUPP: 650.0000 mg | Freq: Four times a day (QID) | RECTAL | Status: DC | PRN

## 2023-12-29 MED ORDER — ONDANSETRON HCL 4 MG PO TABS: 4.0000 mg | ORAL_TABLET | Freq: Four times a day (QID) | ORAL | Status: DC | PRN

## 2023-12-29 MED ORDER — LACTATED RINGERS IV SOLN: INTRAVENOUS | Status: DC

## 2023-12-29 MED ORDER — DEXTROSE IN LACTATED RINGERS 5 % IV SOLN: INTRAVENOUS | Status: AC

## 2023-12-29 MED ORDER — ACETAMINOPHEN 325 MG PO TABS: 650.0000 mg | ORAL_TABLET | Freq: Four times a day (QID) | ORAL | Status: DC | PRN

## 2023-12-29 MED ORDER — CHLORHEXIDINE GLUCONATE CLOTH 2 % EX PADS
6.0000 | MEDICATED_PAD | Freq: Every day | CUTANEOUS | Status: DC
  Administered 2023-12-30 – 2024-01-02 (×4): 6 via TOPICAL

## 2023-12-29 MED ORDER — INSULIN REGULAR(HUMAN) IN NACL 100-0.9 UT/100ML-% IV SOLN
INTRAVENOUS | Status: AC
  Administered 2023-12-29: 12 [IU]/h via INTRAVENOUS
  Filled 2023-12-29: qty 100

## 2023-12-29 MED ORDER — VANCOMYCIN HCL 1500 MG/300ML IV SOLN
1500.0000 mg | Freq: Once | INTRAVENOUS | Status: AC
  Administered 2023-12-29: 1500 mg via INTRAVENOUS
  Filled 2023-12-29: qty 300

## 2023-12-29 MED ORDER — HYDROMORPHONE HCL 1 MG/ML IJ SOLN
0.5000 mg | INTRAMUSCULAR | Status: DC | PRN
  Administered 2023-12-31 – 2024-01-02 (×4): 1 mg via INTRAVENOUS
  Filled 2023-12-29 (×4): qty 1

## 2023-12-29 MED ORDER — INSULIN ASPART 100 UNIT/ML IJ SOLN
10.0000 [IU] | Freq: Once | INTRAMUSCULAR | Status: AC
  Administered 2023-12-29: 10 [IU] via SUBCUTANEOUS
  Filled 2023-12-29: qty 0.1

## 2023-12-29 MED ORDER — PANTOPRAZOLE SODIUM 40 MG PO TBEC
40.0000 mg | DELAYED_RELEASE_TABLET | Freq: Every day | ORAL | Status: DC
  Administered 2023-12-29 – 2024-01-02 (×5): 40 mg via ORAL
  Filled 2023-12-29 (×5): qty 1

## 2023-12-29 MED ORDER — PIPERACILLIN-TAZOBACTAM 3.375 G IVPB 30 MIN
3.3750 g | Freq: Once | INTRAVENOUS | Status: AC
  Administered 2023-12-29: 3.375 g via INTRAVENOUS
  Filled 2023-12-29: qty 50

## 2023-12-29 MED ORDER — IRBESARTAN 300 MG PO TABS
300.0000 mg | ORAL_TABLET | Freq: Every day | ORAL | Status: DC
  Administered 2023-12-29 – 2023-12-30 (×2): 300 mg via ORAL
  Filled 2023-12-29 (×3): qty 1

## 2023-12-29 MED ORDER — ENOXAPARIN SODIUM 40 MG/0.4ML IJ SOSY
40.0000 mg | PREFILLED_SYRINGE | INTRAMUSCULAR | Status: DC
  Administered 2023-12-29 – 2024-01-01 (×4): 40 mg via SUBCUTANEOUS
  Filled 2023-12-29 (×4): qty 0.4

## 2023-12-29 MED ORDER — OXYCODONE HCL 5 MG PO TABS
5.0000 mg | ORAL_TABLET | ORAL | Status: DC | PRN
  Administered 2023-12-30 – 2024-01-01 (×4): 5 mg via ORAL
  Filled 2023-12-29 (×4): qty 1

## 2023-12-29 MED ORDER — ONDANSETRON HCL 4 MG/2ML IJ SOLN: 4.0000 mg | Freq: Four times a day (QID) | INTRAMUSCULAR | Status: DC | PRN

## 2023-12-29 MED ORDER — DEXTROSE 50 % IV SOLN: 0.0000 mL | INTRAVENOUS | Status: DC | PRN

## 2023-12-29 NOTE — Progress Notes (Signed)
 Pharmacy Note   A consult was received from an ED physician for vancomycin  and Zosyn  per pharmacy dosing.    The patient's profile has been reviewed for ht/wt/allergies/indication/available labs.    A one time order has been placed for vancomycin  1500 mg IV x1 and Zosyn  3.375 gr IV x1 .    Further antibiotics/pharmacy consults should be ordered by admitting physician if indicated.                       Thank you,  Van Gelinas, PharmD, BCPS 12/29/2023 8:51 PM

## 2023-12-29 NOTE — ED Triage Notes (Signed)
 Pt arrives via EMS from home. Pt reports he hasn't had his insulin  in a week.  BG was 585 with ems. Pt states he injured his left 5th toe a couple of weeks ago, it now appears infected. Ems administered 800ml of NS pta with no improvement of BG. Pt expressed concerns about him not having power or any resources at his house.

## 2023-12-29 NOTE — H&P (Signed)
 History and Physical  [name] ZOX:[id number] DOB: [birth year] DOA: 12/29/2023  PCP: Paul Coombe, MD   Chief Complaint: Left toe wound  HPI: [name] is a 68 y.o. male with medical history significant for hypertension, hyperlipidemia, CAD, insulin -dependent type 2 diabetes being admitted to the hospital with sepsis due to left toe wound, associated cellulitis, and hyperglycemia without acidosis.  Patient states that unfortunately his wife passed away about 6 months ago and he has been struggling since that time.  About 5 days ago, the power at his house was cut off, he has no phone, or no running water or electricity.  Has not been taking any of his medications.  He injured his left toe about 3 weeks ago, has had some worsening pain, some intermittent fevers and chills.  Denies any nausea or vomiting.  Came to the hospital for evaluation of the toe, and for help getting his affairs in order and power turned back on at his house.  Review of Systems: Please see HPI for pertinent positives and negatives. A complete 10 system review of systems are otherwise negative.  Past Medical History:  Diagnosis Date   Adenomatous colon polyp    Allergic rhinitis    At risk for sleep apnea    STOP-BANG= 5   SENT TO PCP 03-14-2014   Bladder cancer (HCC)    CAD (coronary artery disease)    a. 07/2014 low risk MV; b. 07/2019 Cor CTA (FFR): LM 25-49 (nl), LAD mild prox/mid plaque (nl), D1 25-49p(nl w/ abnl FFR of 0.73 in inf branch), LCX/OM1 mild prox/mid plaque (nl), RCA nondominant, minimal Ca2+ plaque (nl), RPDA (mildly abnl @ 0.79)-->Med Rx..   Cataract    surgically removed bilateral   Condyloma acuminatum of penis    Diabetic neuropathy (HCC)    Diastolic dysfunction    a. 05/2019 Echo: EF 60-65%, no rwam, mod LVH, impaired relaxation, nl RV size/fxn, trace MR, triv TR.   Diverticulosis    GERD (gastroesophageal reflux disease)    History of bladder cancer    s/p  turbt  2013/    transitional cell carcinoma--    History of condyloma acuminatum    PERINEAL AREA  W/ RECURRENCY   History of gout    Hyperlipidemia    Hypertension    Lower urinary tract symptoms (LUTS)    PAF (paroxysmal atrial fibrillation) (HCC)    a. 06/2019 Event monitor: PAF <1% burden. Longest 3 mins 36 secs.   Productive cough    PSVT (paroxysmal supraventricular tachycardia) (HCC)    a. 06/2019 Event monitor: 112 episodes of SVT, longest 21 secs.   PVD (peripheral vascular disease) with claudication (HCC)    a. 03/2014 LE art duplex: long segment occlusion of mid to distal R SFA; b. 03/2020 ABI: nl left and mildly improved R ABI->med rx.   Renal artery stenosis (HCC)    Renal artery stenosis (HCC)    a. 12/2020 Renal art duplex: RRA 1-59%, LRA >60%.   Smokers' cough (HCC)    Type 2 diabetes mellitus with insulin  therapy (HCC) 1992   monitor by  dr ellsion   Wears dentures    upper   Past Surgical History:  Procedure Laterality Date   AXILLARY HIDRADENITIS EXCISION  1997   CARDIOVASCULAR STRESS TEST  07-24-2014  dr Antionette Kirks   Low risk lexiscan  nuclear study with apical thinning and small inferolateral wall infarct at mid & basal level , no ischemia/  normal LVF and wall  motion , ef 59%   CATARACT EXTRACTION Left    CATARACT EXTRACTION W/ INTRAOCULAR LENS IMPLANT Right    CO2 LASER APPLICATION N/A 03/20/2014   Procedure: CO2 LASER APPLICATION,PENIS, GROIN, ANUS;  Surgeon: Eddye Goodie, MD;  Location: [facility];  Service: General;  Laterality: N/A;   CO2 LASER APPLICATION N/A 05/21/2015   Procedure: CO2 LASER APPLICATION;  Surgeon: Mark Ottelin, MD;  Location: [facility];  Service: Urology;  Laterality: N/A;   CONDYLOMA EXCISION/FULGURATION N/A 05/21/2015   Procedure: CONDYLOMA REMOVAL;  Surgeon: Mark Ottelin, MD;  Location: [facility];  Service: Urology;  Laterality: N/A;   HEMORRHOID SURGERY  10/24/2014   Procedure: HEMORRHOIDECTOMY;   Surgeon: Candyce Champagne, MD;  Location: [facility];  Service: General;;   INCISION AND DRAINAGE ABSCESS Left 10/24/2014   Procedure: INCISION AND DRAINAGE ABSCESS;  Surgeon: Candyce Champagne, MD;  Location: [facility];  Service: General;  Laterality: Left;   INGUINAL HIDRADENITIS EXCISION  1998, 1999   LASER ABLATION CONDOLAMATA N/A 03/20/2014   Procedure: EXAM UNDER ANESTHESIA, REMOVAL/ABLATION OF CONDYLOMATA PENIS,GROINS, ANUS, ANAL CANAL;  Surgeon: Eddye Goodie, MD;  Location: [facility];  Service: General;  Laterality: N/A;  groin and anus   LASER ABLATION CONDOLAMATA N/A 10/24/2014   Procedure: LASER ABLATION CONDOLAMATA;  Surgeon: Candyce Champagne, MD;  Location: [facility];  Service: General;  Laterality: N/A;   LASER ABLATION OF PENILE AND PERIANAL WARTS  07-29-2007  Dr. Hershell Lose   LEFT SHOULDER SURGERY  2003   MASS EXCISION N/A 10/24/2014   Procedure: EXCISION OF PERINEAL MASS/SINUS;  Surgeon: Candyce Champagne, MD;  Location: [facility] ;  Service: General;  Laterality: N/A;   MOHS SURGERY     back   MOHS SURGERY  2017   face   MULTIPLE TOOTH EXTRACTIONS     PERINEAL HIDRADENITIS EXCISION  1998, 1999   TRANSURETHRAL RESECTION OF BLADDER TUMOR  05/21/2012   Procedure: TRANSURETHRAL RESECTION OF BLADDER TUMOR (TURBT);  Surgeon: Mark C Ottelin, MD;  Location: [facility];  Service: Urology;  Laterality: N/A;      Social History:  reports that he has been smoking cigars and cigarettes. He started smoking about 49 years ago. He has a 41 pack-year smoking history. He has never used smokeless tobacco. He reports that he does not drink alcohol and does not use drugs.  Allergies  Allergen Reactions   Cefepime  Hives    Had allergic reaction to one of these 3 agents, unclear which one   Metoprolol  Hives    Had allergic reaction to one of these 3 agents, unclear which one  *Per RN, highly likely this is the cause  of allergic rxn   Myrbetriq  [Mirabegron ] Hives    Had allergic reaction to one of these 3 agents, unclear which one    Family History  Problem Relation Age of Onset   Hypertension Mother    Lung cancer Father    Diabetes Maternal Aunt        x 2   Colon cancer Neg Hx    Esophageal cancer Neg Hx    Pancreatic cancer Neg Hx    Prostate cancer Neg Hx    Kidney disease Neg Hx    Liver disease Neg Hx    Rectal cancer Neg Hx    Stomach cancer Neg Hx      Prior to Admission medications   Medication Sig Start Date  End Date Taking? Authorizing Provider  aspirin  EC 81 MG tablet Take 1 tablet (81 mg total) by mouth daily. 11/25/16  Yes Wenona Hamilton, MD  insulin  glargine, 2 Unit Dial, (TOUJEO  MAX SOLOSTAR) 300 UNIT/ML Solostar Pen Inject 150 Units into the skin every morning. Patient taking differently: Inject 100 Units into the skin every morning. 07/02/22  Yes Shamleffer, Ibtehal Jaralla, MD  irbesartan  (AVAPRO ) 300 MG tablet TAKE 1 TABLET BY MOUTH DAILY 07/03/23  Yes Paul Coombe, MD  loratadine  (CLARITIN ) 10 MG tablet Take 10 mg by mouth daily as needed.   Yes [provider]  pantoprazole  (PROTONIX ) 40 MG tablet TAKE 1 TABLET BY MOUTH DAILY 05/18/23  Yes Paul Coombe, MD  spironolactone  (ALDACTONE ) 25 MG tablet Take 1 tablet by mouth once daily Patient not taking: Reported on 12/29/2023 08/25/23   Lasalle Pointer, NP  VITAMIN D , CHOLECALCIFEROL, PO Take 5,000 Units by mouth daily.   Yes [provider]  atorvastatin  (LIPITOR) 40 MG tablet Take 1 tablet by mouth once daily Patient not taking: Reported on 12/29/2023 04/21/23   Paul Coombe, MD  Blood Glucose Monitoring Suppl (ONE TOUCH ULTRA 2) w/Device KIT Use as directed to check blood sugars twice a day 11/25/19   Paul Coombe, MD  chlorthalidone  (HYGROTON ) 25 MG tablet Take 1 tablet (25 mg total) by mouth daily. PLEASE CONTACT OFFICE TO SCHEDULE APPT BEFORE FURTHER REFILLS ARE REQUESTED. [phone] (FIRST  ATTEMPT) Patient not taking: Reported on 12/29/2023 09/03/23   Wenona Hamilton, MD  citalopram  (CELEXA ) 20 MG tablet Take 1 tablet by mouth once daily Patient not taking: Reported on 12/29/2023 05/22/23   Paul Coombe, MD  Lancets [facility] DELICA PLUS [place]) MISC CHECK BLOOD SUGAR TWICE DAILY 09/10/23   Paul Coombe, MD  [facility] ULTRA test strip CHECK BLOOD SUGAR TWICE DAILY 11/25/22   Paul Coombe, MD  Semaglutide ,0.25 or 0.5MG /DOS, (OZEMPIC , 0.25 OR 0.5 MG/DOSE,) 2 MG/3ML SOPN Inject 0.5 mg into the skin once a week. Patient not taking: Reported on 12/29/2023 07/02/22   Shamleffer, Julian Obey, MD    Physical Exam: BP (!) 128/96   Pulse 75   Temp 98.3 F (36.8 C) (Oral)   Resp 12   SpO2 99%  General:  Alert, oriented, calm, in no acute distress, looks disheveled but nontoxic Cardiovascular: RRR, no murmurs or rubs, no peripheral edema  Respiratory: clear to auscultation bilaterally, no wheezes, no crackles  Abdomen: soft, nontender, nondistended, normal bowel tones heard  Skin: dry, no rashes, left fifth toe with small medial laceration with associated erythema, tenderness.  No open drainage seen.  No significant tracking erythema. Musculoskeletal: no joint effusions, normal range of motion            Labs on Admission:  Basic Metabolic Panel: Recent Labs  Lab 12/29/23 1839  NA 123*  K 4.0  CL 83*  CO2 25  GLUCOSE 658*  BUN 26*  CREATININE 1.21  CALCIUM  9.4   Liver Function Tests: No results for input(s): "AST", "ALT", "ALKPHOS", "BILITOT", "PROT", "ALBUMIN" in the last 168 hours. No results for input(s): "LIPASE", "AMYLASE" in the last 168 hours. No results for input(s): "AMMONIA" in the last 168 hours. CBC: Recent Labs  Lab 12/29/23 1839  WBC 15.0*  NEUTROABS 11.8*  HGB 16.5  HCT 44.6  MCV 89.9  PLT 270   Cardiac Enzymes: No results for input(s): "CKTOTAL", "CKMB", "CKMBINDEX", "TROPONINI" in the last 168 hours. BNP (last 3 results) No  results  for input(s): "BNP" in the last 8760 hours.  ProBNP (last 3 results) No results for input(s): "PROBNP" in the last 8760 hours.  CBG: Recent Labs  Lab 12/29/23 1819 12/29/23 2024  GLUCAP >600* 448*    Radiological Exams on Admission: DG Foot 2 Views Left Result Date: 12/29/2023 CLINICAL DATA:  pinky toe osteo? EXAM: LEFT FOOT - 2 VIEW COMPARISON:  None Available. FINDINGS: No acute fracture or dislocation. No aggressive osseous lesion. Mild diffuse arthritis of imaged joints. Calcaneal spur noted along the Achilles tendon and Plantar aponeurosis attachment sites. There is subtle ossific density along the medial aspect of the proximal portion of the distal phalanx of fifth toe. However, no discrete adjacent cortical defect noted. There is diffuse soft tissue swelling of the fifth toe. No discrete soft tissue defect or air within the soft tissue. Findings are equivocal for acute osteomyelitis. Correlate clinically to determine the need for additional imaging with more sensitive modality of MRI. No radiopaque foreign bodies. IMPRESSION: 1. No acute osseous abnormality of the left foot. 2. There is subtle ossific density along the medial aspect of the proximal portion of the distal phalanx of fifth toe. However, no discrete adjacent cortical defect noted. There is diffuse soft tissue swelling of the fifth toe. No discrete soft tissue defect or air within the soft tissue. Findings are equivocal for acute osteomyelitis. Electronically Signed   By: Beula Brunswick M.D.   On: 12/29/2023 19:29   Assessment/Plan [name] is a 68 y.o. male with medical history significant for hypertension, hyperlipidemia, CAD, insulin -dependent type 2 diabetes being admitted to the hospital with sepsis due to left toe wound, associated cellulitis, and hyperglycemia without acidosis.   Sepsis-meeting criteria with tachycardia, leukocytosis, source is left fifth toe cellulitis.  No evidence of acute endorgan damage.  This  is likely the source of his hyperglycemia. -Inpatient admission -Empiric IV Zosyn  and IV vancomycin , given his cephalosporin allergy -Follow-up blood cultures -Check MRI left foot to evaluate for osteomyelitis  Hyperglycemia without acidosis-due to medication noncompliance, and acute infection as above -Clear liquid diet until blood sugars improved -Continue IV fluids per insulin  drip protocol -Can likely transition to diabetic diet with basal bolus insulin  once blood sugars improve  Hypertension hyperlipidemia-has not taken his medications for at least several days, will plan to resume gradually as indicated. Patient states he has been taking his irbesartan  so this has been continued  TOC consult has been placed to assist the patient with his housing situation, including recent loss of power, phone, etc.  DVT prophylaxis: Lovenox      Code Status: Full Code  Consults called: None  Admission status: The appropriate patient status for this patient is INPATIENT. Inpatient status is judged to be reasonable and necessary in order to provide the required intensity of service to ensure the patient's safety. The patient's presenting symptoms, physical exam findings, and initial radiographic and laboratory data in the context of their chronic comorbidities is felt to place them at high risk for further clinical deterioration. Furthermore, it is not anticipated that the patient will be medically stable for discharge from the hospital within 2 midnights of admission.    I certify that at the point of admission it is my clinical judgment that the patient will require inpatient hospital care spanning beyond 2 midnights from the point of admission due to high intensity of service, high risk for further deterioration and high frequency of surveillance required  Time spent: 59 minutes  Paul Bey M  Jannette Mend MD [facility] Pager [phone]  If 7PM-7AM, please contact  night-coverage [email or web address] Password [facility]  12/29/2023, 9:13 PM

## 2023-12-30 ENCOUNTER — Telehealth: Payer: Self-pay

## 2023-12-30 ENCOUNTER — Inpatient Hospital Stay (HOSPITAL_COMMUNITY)

## 2023-12-30 DIAGNOSIS — K529 Noninfective gastroenteritis and colitis, unspecified: Secondary | ICD-10-CM

## 2023-12-30 DIAGNOSIS — E785 Hyperlipidemia, unspecified: Secondary | ICD-10-CM

## 2023-12-30 DIAGNOSIS — Z794 Long term (current) use of insulin: Secondary | ICD-10-CM

## 2023-12-30 DIAGNOSIS — E876 Hypokalemia: Secondary | ICD-10-CM

## 2023-12-30 DIAGNOSIS — E1165 Type 2 diabetes mellitus with hyperglycemia: Secondary | ICD-10-CM

## 2023-12-30 DIAGNOSIS — I10 Essential (primary) hypertension: Secondary | ICD-10-CM | POA: Diagnosis not present

## 2023-12-30 DIAGNOSIS — M869 Osteomyelitis, unspecified: Secondary | ICD-10-CM

## 2023-12-30 DIAGNOSIS — E119 Type 2 diabetes mellitus without complications: Secondary | ICD-10-CM

## 2023-12-30 DIAGNOSIS — L0390 Cellulitis, unspecified: Secondary | ICD-10-CM | POA: Diagnosis not present

## 2023-12-30 LAB — BASIC METABOLIC PANEL WITH GFR
Anion gap: 10 (ref 5–15)
BUN: 20 mg/dL (ref 8–23)
CO2: 28 mmol/L (ref 22–32)
Calcium: 9.2 mg/dL (ref 8.9–10.3)
Chloride: 94 mmol/L — ABNORMAL LOW (ref 98–111)
Creatinine, Ser: 0.87 mg/dL (ref 0.61–1.24)
GFR, Estimated: >60 mL/min (ref >60)
Glucose, Bld: 137 mg/dL — ABNORMAL HIGH (ref 70–99)
Potassium: 3.3 mmol/L — ABNORMAL LOW (ref 3.5–5.1)
Sodium: 132 mmol/L — ABNORMAL LOW (ref 135–145)

## 2023-12-30 LAB — CBC
HCT: 43.6 % (ref 39.0–52.0)
Hemoglobin: 15.6 g/dL (ref 13.0–17.0)
MCH: 33.5 pg (ref 26.0–34.0)
MCHC: 35.8 g/dL (ref 30.0–36.0)
MCV: 93.6 fL (ref 80.0–100.0)
Platelets: 212 10*3/uL (ref 150–400)
RBC: 4.66 MIL/uL (ref 4.22–5.81)
RDW: 12.8 % (ref 11.5–15.5)
WBC: 14.4 10*3/uL — ABNORMAL HIGH (ref 4.0–10.5)
nRBC: 0 % (ref 0.0–0.2)

## 2023-12-30 LAB — GLUCOSE, CAPILLARY
Glucose-Capillary: 118 mg/dL — ABNORMAL HIGH (ref 70–99)
Glucose-Capillary: 148 mg/dL — ABNORMAL HIGH (ref 70–99)
Glucose-Capillary: 151 mg/dL — ABNORMAL HIGH (ref 70–99)
Glucose-Capillary: 189 mg/dL — ABNORMAL HIGH (ref 70–99)
Glucose-Capillary: 219 mg/dL — ABNORMAL HIGH (ref 70–99)
Glucose-Capillary: 282 mg/dL — ABNORMAL HIGH (ref 70–99)
Glucose-Capillary: 95 mg/dL (ref 70–99)

## 2023-12-30 LAB — C DIFFICILE QUICK SCREEN W PCR REFLEX
C Diff antigen: NEGATIVE
C Diff interpretation: NOT DETECTED
C Diff toxin: NEGATIVE

## 2023-12-30 LAB — HIV ANTIBODY (ROUTINE TESTING W REFLEX): HIV Screen 4th Generation wRfx: NONREACTIVE

## 2023-12-30 LAB — MRSA NEXT GEN BY PCR, NASAL: MRSA by PCR Next Gen: NOT DETECTED

## 2023-12-30 LAB — MAGNESIUM: Magnesium: 1.8 mg/dL (ref 1.7–2.4)

## 2023-12-30 LAB — LACTIC ACID, PLASMA: Lactic Acid, Venous: 1.7 mmol/L (ref 0.5–1.9)

## 2023-12-30 MED ORDER — POTASSIUM CHLORIDE CRYS ER 10 MEQ PO TBCR
40.0000 meq | EXTENDED_RELEASE_TABLET | Freq: Once | ORAL | Status: AC
  Administered 2023-12-30: 40 meq via ORAL
  Filled 2023-12-30: qty 4

## 2023-12-30 MED ORDER — INSULIN ASPART 100 UNIT/ML IJ SOLN
0.0000 [IU] | Freq: Every day | INTRAMUSCULAR | Status: DC
Start: 2023-12-30 — End: 2024-01-02
  Administered 2023-12-30: 2 [IU] via SUBCUTANEOUS
  Administered 2023-12-31: 4 [IU] via SUBCUTANEOUS
  Administered 2024-01-01: 3 [IU] via SUBCUTANEOUS

## 2023-12-30 MED ORDER — INSULIN GLARGINE-YFGN 100 UNIT/ML ~~LOC~~ SOLN
5.0000 [IU] | SUBCUTANEOUS | Status: DC
  Administered 2023-12-30: 5 [IU] via SUBCUTANEOUS
  Filled 2023-12-30 (×2): qty 0.05

## 2023-12-30 MED ORDER — ORAL CARE MOUTH RINSE: 15.0000 mL | OROMUCOSAL | Status: DC | PRN

## 2023-12-30 MED ORDER — SODIUM CHLORIDE 0.9 % IV BOLUS
1000.0000 mL | Freq: Once | INTRAVENOUS | Status: AC
  Administered 2023-12-30: 1000 mL via INTRAVENOUS

## 2023-12-30 MED ORDER — GADOBUTROL 1 MMOL/ML IV SOLN
6.0000 mL | Freq: Once | INTRAVENOUS | Status: AC | PRN
  Administered 2023-12-30: 6 mL via INTRAVENOUS

## 2023-12-30 MED ORDER — POTASSIUM CHLORIDE CRYS ER 20 MEQ PO TBCR
20.0000 meq | EXTENDED_RELEASE_TABLET | Freq: Once | ORAL | Status: AC
  Administered 2023-12-30: 20 meq via ORAL
  Filled 2023-12-30: qty 1

## 2023-12-30 MED ORDER — INSULIN ASPART 100 UNIT/ML IJ SOLN
0.0000 [IU] | Freq: Three times a day (TID) | INTRAMUSCULAR | Status: DC
  Administered 2023-12-30: 8 [IU] via SUBCUTANEOUS
  Administered 2023-12-30 (×2): 3 [IU] via SUBCUTANEOUS
  Administered 2023-12-31: 8 [IU] via SUBCUTANEOUS
  Administered 2023-12-31: 3 [IU] via SUBCUTANEOUS
  Administered 2023-12-31: 11 [IU] via SUBCUTANEOUS
  Administered 2024-01-01: 2 [IU] via SUBCUTANEOUS
  Administered 2024-01-01: 8 [IU] via SUBCUTANEOUS

## 2023-12-30 MED ORDER — ATORVASTATIN CALCIUM 40 MG PO TABS
40.0000 mg | ORAL_TABLET | Freq: Every day | ORAL | Status: DC
  Administered 2023-12-30 – 2023-12-31 (×2): 40 mg via ORAL
  Filled 2023-12-30 (×2): qty 1

## 2023-12-30 MED ORDER — SODIUM CHLORIDE 0.9 % IV SOLN: INTRAVENOUS | Status: AC

## 2023-12-30 MED ORDER — CALCIUM CARBONATE ANTACID 500 MG PO CHEW
1.0000 | CHEWABLE_TABLET | Freq: Three times a day (TID) | ORAL | Status: DC
  Administered 2023-12-30 – 2024-01-02 (×5): 200 mg via ORAL
  Filled 2023-12-30 (×7): qty 1

## 2023-12-30 MED ORDER — GUAIFENESIN ER 600 MG PO TB12
600.0000 mg | ORAL_TABLET | Freq: Two times a day (BID) | ORAL | Status: DC
  Administered 2023-12-30 – 2024-01-02 (×5): 600 mg via ORAL
  Filled 2023-12-30 (×5): qty 1

## 2023-12-30 MED ORDER — INSULIN GLARGINE-YFGN 100 UNIT/ML ~~LOC~~ SOLN
5.0000 [IU] | Freq: Once | SUBCUTANEOUS | Status: AC
  Administered 2023-12-30: 5 [IU] via SUBCUTANEOUS
  Filled 2023-12-30: qty 0.05

## 2023-12-30 MED ORDER — HYDROXYZINE HCL 25 MG PO TABS
25.0000 mg | ORAL_TABLET | Freq: Three times a day (TID) | ORAL | Status: DC | PRN
  Administered 2023-12-30: 25 mg via ORAL
  Filled 2023-12-30: qty 1

## 2023-12-30 MED ORDER — LORATADINE 10 MG PO TABS: 10.0000 mg | ORAL_TABLET | Freq: Every day | ORAL | Status: DC | PRN

## 2023-12-30 MED ORDER — PIPERACILLIN-TAZOBACTAM 3.375 G IVPB
3.3750 g | Freq: Three times a day (TID) | INTRAVENOUS | Status: DC
  Administered 2023-12-30 – 2024-01-01 (×7): 3.375 g via INTRAVENOUS
  Filled 2023-12-30 (×7): qty 50

## 2023-12-30 MED ORDER — VANCOMYCIN HCL 1.25 G IV SOLR: 1250.0000 mg | INTRAVENOUS | Status: DC

## 2023-12-30 MED ORDER — ASPIRIN 81 MG PO TBEC
81.0000 mg | DELAYED_RELEASE_TABLET | Freq: Every day | ORAL | Status: DC
Start: 2023-12-30 — End: 2024-01-02
  Administered 2023-12-30 – 2024-01-02 (×3): 81 mg via ORAL
  Filled 2023-12-30 (×3): qty 1

## 2023-12-30 MED ORDER — ENSURE ENLIVE PO LIQD
237.0000 mL | Freq: Two times a day (BID) | ORAL | Status: DC
  Administered 2023-12-31 – 2024-01-02 (×2): 237 mL via ORAL

## 2023-12-30 MED ORDER — MAGNESIUM SULFATE 2 GM/50ML IV SOLN
2.0000 g | Freq: Once | INTRAVENOUS | Status: AC
  Administered 2023-12-30: 2 g via INTRAVENOUS
  Filled 2023-12-30: qty 50

## 2023-12-30 MED ORDER — VANCOMYCIN HCL 750 MG/150ML IV SOLN
750.0000 mg | Freq: Two times a day (BID) | INTRAVENOUS | Status: DC
  Administered 2023-12-30 – 2024-01-01 (×5): 750 mg via INTRAVENOUS
  Filled 2023-12-30 (×5): qty 150

## 2023-12-30 MED ORDER — MENTHOL 3 MG MT LOZG
1.0000 | LOZENGE | OROMUCOSAL | Status: DC | PRN
  Filled 2023-12-30: qty 9

## 2023-12-30 NOTE — Plan of Care (Signed)
 Patient up to [facility] without assistance, patient instructed to use call bell when needing to get out of bed, patient has good understanding of safety precautions, plan of care and goals reviewed with patient, time given for questions, patient handbook at bedside, bed alarm on, side rails up with call bell in reach.  Problem: Education: Goal: Knowledge of General Education information will improve Description: Including pain rating scale, medication(s)/side effects and non-pharmacologic comfort measures Outcome: Progressing   Problem: Health Behavior/Discharge Planning: Goal: Ability to manage health-related needs will improve Outcome: Progressing   Problem: Clinical Measurements: Goal: Ability to maintain clinical measurements within normal limits will improve Outcome: Progressing Goal: Will remain free from infection Outcome: Progressing Goal: Diagnostic test results will improve Outcome: Progressing Goal: Respiratory complications will improve Outcome: Progressing Goal: Cardiovascular complication will be avoided Outcome: Progressing   Problem: Activity: Goal: Risk for activity intolerance will decrease Outcome: Progressing   Problem: Nutrition: Goal: Adequate nutrition will be maintained Outcome: Progressing   Problem: Coping: Goal: Level of anxiety will decrease Outcome: Progressing   Problem: Elimination: Goal: Will not experience complications related to bowel motility Outcome: Progressing Goal: Will not experience complications related to urinary retention Outcome: Progressing   Problem: Pain Managment: Goal: General experience of comfort will improve and/or be controlled Outcome: Progressing   Problem: Safety: Goal: Ability to remain free from injury will improve Outcome: Progressing   Problem: Skin Integrity: Goal: Risk for impaired skin integrity will decrease Outcome: Progressing

## 2023-12-30 NOTE — Progress Notes (Signed)
 Pharmacy Antibiotic Note  [name] is a 68 y.o. male admitted on 12/29/2023 with sepsis due to left toe cellulitis.  Pharmacy has been consulted for Vancomycin  and Zosyn  dosing.  Plan: Zosyn  3.375g IV q8h (4 hour infusion). Vancomycin  1250 mg IV Q 24 hrs. Goal AUC 400-550.  Expected AUC: 475.7  SCr used: 1.02 Daily SCr while on both Vancomycin  and Zosyn  F/u culture results & sensitivities  Height: 5\' 8"  (172.7 cm) Weight: 63.9 kg (140 lb 14 oz) IBW/kg (Calculated) : 68.4  Temp (24hrs), Avg:98.3 F (36.8 C), Min:98.2 F (36.8 C), Max:98.3 F (36.8 C)  Recent Labs  Lab 12/29/23 1839 12/29/23 2220  WBC 15.0*  --   CREATININE 1.21 1.02  LATICACIDVEN  --  4.0*    Estimated Creatinine Clearance: 63.5 mL/min (by C-G formula based on SCr of 1.02 mg/dL).    Allergies  Allergen Reactions   Cefepime  Hives    Had allergic reaction to one of these 3 agents, unclear which one   Metoprolol  Hives    Had allergic reaction to one of these 3 agents, unclear which one  *Per RN, highly likely this is the cause of allergic rxn   Myrbetriq  [Mirabegron ] Hives    Had allergic reaction to one of these 3 agents, unclear which one    Antimicrobials this admission: 5/20 Zosyn  >>   5/20 Vancomycin   >>    Dose adjustments this admission:    Microbiology results: 5/20 BCx:    5/21 MRSA PCR: negative  Thank you for allowing pharmacy to be a part of this patient's care.  Rulon Councilman, PharmD 12/30/2023 2:15 AM

## 2023-12-30 NOTE — Progress Notes (Signed)
 Patient was found attempting to exit the bed, ripping his IV/EKG leads/O2 monitoring off and yelling at this nurse, "I need to get the hell out of here and pay my mortgage; I am not going to lose my house because of all this bullshit you all are putting me through!" This nurse deescalated the situation and educated the patient of the risks of leaving at this time with a current diagnosis of sepsis and osteomyelitis from his injury. This nurse informed Dr. Hildy Lowers of the patient's wishes. This nurse reached out to the patient's case worker for today to request assistance with the patient's concerns in his home/home health/etc. Patient agreed to speak with his case manager at this time and informed this nurse that he understands what we have discussed and will wait a little longer.

## 2023-12-30 NOTE — Progress Notes (Signed)
 PROGRESS NOTE    [name]  WUJ:[id number] DOB: Dec 08, 1955 DOA: 12/29/2023 PCP: Roslyn Coombe, MD   Chief Complaint  Patient presents with   Hyperglycemia    Brief Narrative:  Patient 68 year old gentleman history of hypertension, hyperlipidemia, CAD, insulin -dependent type 2 diabetes admitted to the hospital with sepsis found secondary to left toe wound/cellulitis and noted to be hyperglycemic without acidosis.  Patient placed on the Endo tool transition to subcutaneous insulin  and sliding scale insulin .  Patient placed empirically on IV antibiotics.  MRI obtained of the left foot concerning for osteomyelitis of the toe.  Podiatry to be consulted.   Assessment & Plan:   Principal Problem:   Sepsis due to cellulitis [facility]) Active Problems:   Osteomyelitis of fifth toe of left foot (HCC)   Essential hypertension   Dyslipidemia   Diabetes (HCC)   Hypokalemia   Insulin  dependent type 2 diabetes mellitus (HCC)   Chronic diarrhea   Uncontrolled type 2 diabetes mellitus with hyperglycemia, with long-term current use of insulin  (HCC)  #1 sepsis secondary to left toe cellulitis/left toe osteomyelitis, POA -Patient met criteria for sepsis on admission with tachycardia, leukocytosis, and source concerning for left fifth toe cellulitis/osteomyelitis with no evidence of acute endorgan damage. - Sepsis was likely etiology of patient's hyperglycemia. - Blood cultures obtained and pending with no growth to date. - MRSA PCR negative. - MRI of the left foot with osteomyelitis of the distal phalanx of the small toe, edema in the medial sesamoid first digit with a transverse cleft in the medial sesamoid, possibilities include sesamoid fracture versus sesamoiditis in the setting of incidental bifid sesamoid.  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  Accentuated cutaneous enhancement in the small toe potentially from cellulitis.  Mild  degenerative arthropathy along the proximal lateral portion of the medial cuneiform. - Continue empiric IV vancomycin , IV Zosyn . - Will consult with podiatry for further evaluation and management.  2.  Hyperglycemia without acidosis - Felt likely multifactorial secondary to medication noncompliance and acute infection. - Patient noted on admission to have blood glucose as high as 658, with no acidosis noted, normal anion gap. - Patient placed on Endo tool and has been transition to subcutaneous insulin  of Semglee  5 units daily with sliding scale insulin . - CBG noted at 151 this morning. - Per med rec complete patient was on Toujeo  100 units daily however currently being managed on Semglee  5 units daily. - Continue SSI. - Follow CBGs and uptitrate Semglee  as needed for better blood glucose control. - Check a hemoglobin A1c. - Diabetes coordinator following.  3.  Hypertension -Patient noted not to have taking medications for at least several days however does state admitting physician he took his irbesartan . - BP soft. - Decrease Avapro  to 150 mg daily. - Continue to hold chlorthalidone , spironolactone  which patient was supposed to be on per med rec.  4.  Hyperlipidemia -Noted to have not been on any medications for past  few days prior to admission. - Per med rec patient not on any medications. - Check a fasting lipid panel.  5.  Diarrhea -Per RN patient with 4 watery loose stools this morning. - Patient however states was constipated prior to admission.  And feels he just had a blowout. - Check a C. difficile, GI pathogen panel and if negative will place on Imodium  as needed.  6.  Pseudohyponatremia -Secondary to hyperglycemia. - Improved with correction of hyperglycemia and IV  fluids.  7.  Hypokalemia -Magnesium  at 1.8. - Potassium repleted. - Keep magnesium  approximately 2.  TOC consultation placed to assist patient with his housing situation including recent loss of power,  phone, E TC.   DVT prophylaxis: Lovenox  Code Status: Full Family Communication: Updated patient.  No family at bedside. Disposition: TBD  Status is: Inpatient Remains inpatient appropriate because: Severity of illness   Consultants:  Podiatry pending  Procedures: Plain films of the left foot 12/29/2023 MRI left foot 12/30/2023   Antimicrobials:  Anti-infectives (From admission, onward)    Start     Dose/Rate Route Frequency Ordered Stop   12/30/23 2200  Vancomycin  (VANCOCIN ) 1,250 mg in sodium chloride  0.9 % 250 mL IVPB  Status:  Discontinued        1,250 mg 166.7 mL/hr over 90 Minutes Intravenous Every 24 hours 12/30/23 0215 12/30/23 1023   12/30/23 1200  vancomycin  (VANCOREADY) IVPB 750 mg/150 mL        750 mg 150 mL/hr over 60 Minutes Intravenous Every 12 hours 12/30/23 1023     12/30/23 0600  piperacillin -tazobactam (ZOSYN ) IVPB 3.375 g        3.375 g 12.5 mL/hr over 240 Minutes Intravenous Every 8 hours 12/30/23 0212     12/29/23 2100  vancomycin  (VANCOREADY) IVPB 1500 mg/300 mL        1,500 mg 150 mL/hr over 120 Minutes Intravenous  Once 12/29/23 2051 12/30/23 0158   12/29/23 2100  piperacillin -tazobactam (ZOSYN ) IVPB 3.375 g        3.375 g 100 mL/hr over 30 Minutes Intravenous  Once 12/29/23 2051 12/30/23 0011         Subjective: Patient laying in bed.  Complains of left fifth toe pain.  States was constipated prior to admission however per RN noted to have 4 watery loose stools this morning.  Patient denies any chest pain or shortness of breath.  Tolerating current diet.  Objective: Vitals:   12/30/23 1200 12/30/23 1300 12/30/23 1323 12/30/23 1400  BP: 105/87 (!) 134/52  (!) 104/56  Pulse: 84 77 (!) 58 64  Resp: 19 (!) 25 13 15   Temp: 97.8 F (36.6 C)     TempSrc: Oral     SpO2: 96% 98% (!) 84% 99%  Weight:      Height:        Intake/Output Summary (Last 24 hours) at 12/30/2023 1616 Last data filed at 12/30/2023 1500 Gross per 24 hour  Intake 4812.5  ml  Output 2550 ml  Net 2262.5 ml   Filed Weights   12/30/23 0010  Weight: 63.9 kg    Examination:  General exam: Appears calm and comfortable  Respiratory system: Clear to auscultation. Respiratory effort normal. Cardiovascular system: S1 & S2 heard, RRR. No JVD, murmurs, rubs, gallops or clicks. No pedal edema. Gastrointestinal system: Abdomen is nondistended, soft and nontender. No organomegaly or masses felt. Normal bowel sounds heard. Central nervous system: Alert and oriented. No focal neurological deficits. Extremities: Left foot with some erythema on the left toe, tender to palpation, some erythema.  No significant drainage noted. Skin: No rashes, lesions or ulcers Psychiatry: Judgement and insight appear normal. Mood & affect appropriate.     Data Reviewed: I have personally reviewed following labs and imaging studies  CBC: Recent Labs  Lab 12/29/23 1839 12/30/23 0311  WBC 15.0* 14.4*  NEUTROABS 11.8*  --   HGB 16.5 15.6  HCT 44.6 43.6  MCV 89.9 93.6  PLT 270 212    Basic  Metabolic Panel: Recent Labs  Lab 12/29/23 1839 12/29/23 2220 12/30/23 0311  NA 123* 132* 132*  K 4.0 3.2* 3.3*  CL 83* 92* 94*  CO2 25 28 28   GLUCOSE 658* 150* 137*  BUN 26* 23 20  CREATININE 1.21 1.02 0.87  CALCIUM  9.4 9.4 9.2  MG  --   --  1.8    GFR: Estimated Creatinine Clearance: 74.5 mL/min (by C-G formula based on SCr of 0.87 mg/dL).  Liver Function Tests: No results for input(s): "AST", "ALT", "ALKPHOS", "BILITOT", "PROT", "ALBUMIN" in the last 168 hours.  CBG: Recent Labs  Lab 12/30/23 0053 12/30/23 0202 12/30/23 0306 12/30/23 0809 12/30/23 1122  GLUCAP 95 118* 148* 151* 189*     Recent Results (from the past 240 hours)  Culture, blood (routine x 2)     Status: None (Preliminary result)   Collection Time: 12/29/23  7:31 PM   Specimen: BLOOD RIGHT ARM  Result Value Ref Range Status   Specimen Description   Final    BLOOD RIGHT ARM Performed at [facility], [address] [place], [place] [address]    Special Requests   Final    BOTTLES DRAWN AEROBIC AND ANAEROBIC Blood Culture adequate volume Performed at [facility], [address], [place], [place] [address]    Culture   Final    NO GROWTH < 12 HOURS Performed at [facility], [address], [place], [place] [address]    Report Status PENDING  Incomplete  Culture, blood (routine x 2)     Status: None (Preliminary result)   Collection Time: 12/29/23  7:31 PM   Specimen: BLOOD LEFT ARM  Result Value Ref Range Status   Specimen Description   Final    BLOOD LEFT ARM Performed at [facility], [address], [place], [place] [address]    Special Requests   Final    BOTTLES DRAWN AEROBIC AND ANAEROBIC Blood Culture results may not be optimal due to an inadequate volume of blood received in culture bottles Performed at [facility], [address] [place], [place] [address]    Culture   Final    NO GROWTH < 12 HOURS Performed at [facility], [address], [place], [place] [address]    Report Status PENDING  Incomplete  MRSA Next Gen by PCR, Nasal     Status: None   Collection Time: 12/30/23 12:10 AM   Specimen: Nasal Mucosa; Nasal Swab  Result Value Ref Range Status   MRSA by PCR Next Gen NOT DETECTED NOT DETECTED Final    Comment: (NOTE) The GeneXpert MRSA Assay (FDA approved for NASAL specimens only), is one component of a comprehensive MRSA colonization surveillance program. It is not intended to diagnose MRSA infection nor to guide or monitor treatment for MRSA infections. Test performance is not FDA approved in patients less than 81 years old. Performed at [facility], [address], [place], [place] [address]   C Difficile Quick Screen w PCR reflex     Status: None   Collection Time: 12/30/23 10:00 AM   Specimen: STOOL  Result Value Ref Range Status   C Diff antigen NEGATIVE  NEGATIVE Final   C Diff toxin NEGATIVE NEGATIVE Final   C Diff interpretation No C. difficile detected.  Final    Comment: Performed at [facility], [address] [place], [place] [address]  Radiology Studies: MR FOOT LEFT W WO CONTRAST Result Date: 12/30/2023 CLINICAL DATA:  Open wound along the small toe, possible osteomyelitis EXAM: MRI OF THE LEFT FOREFOOT WITHOUT AND WITH CONTRAST TECHNIQUE: Multiplanar, multisequence MR imaging of the left forefoot was performed both before and after administration of intravenous contrast. CONTRAST:  6mL GADAVIST GADOBUTROL 1 MMOL/ML IV SOLN COMPARISON:  12/29/2023 FINDINGS: Bones/Joint/Cartilage Abnormal edema and enhancement in the distal phalanx small toe compatible with osteomyelitis. Edema in the medial sesamoid of the first digit with a transverse cleft in the medial sesamoid, possibilities include sesamoid fracture versus sesamoiditis in the setting of incidental bifid sesamoid. Mild degenerative arthropathy along the proximal lateral portion of the medial cuneiform. Ligaments Lisfranc ligament intact. Muscles and Tendons Fusiform thickening of the distal medial band of the plantar fascia on image 10 series 7 compatible with plantar fibromatosis. Soft tissues Accentuated cutaneous enhancement in the small toe potentially from cellulitis. IMPRESSION: 1. Osteomyelitis of the distal phalanx small toe. 2. Edema in the medial sesamoid first digit with a transverse cleft in the medial sesamoid, possibilities include sesamoid fracture versus sesamoiditis in the setting of incidental bifid sesamoid. 3. 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 4. Accentuated cutaneous enhancement in the small toe potentially from cellulitis. 5. Mild degenerative arthropathy along the proximal lateral portion of the medial cuneiform. Electronically Signed   By: Freida Jes M.D.   On:  12/30/2023 14:08   DG Foot 2 Views Left Result Date: 12/29/2023 CLINICAL DATA:  pinky toe osteo? EXAM: LEFT FOOT - 2 VIEW COMPARISON:  None Available. FINDINGS: No acute fracture or dislocation. No aggressive osseous lesion. Mild diffuse arthritis of imaged joints. Calcaneal spur noted along the Achilles tendon and Plantar aponeurosis attachment sites. There is subtle ossific density along the medial aspect of the proximal portion of the distal phalanx of fifth toe. However, no discrete adjacent cortical defect noted. There is diffuse soft tissue swelling of the fifth toe. No discrete soft tissue defect or air within the soft tissue. Findings are equivocal for acute osteomyelitis. Correlate clinically to determine the need for additional imaging with more sensitive modality of MRI. No radiopaque foreign bodies. IMPRESSION: 1. No acute osseous abnormality of the left foot. 2. There is subtle ossific density along the medial aspect of the proximal portion of the distal phalanx of fifth toe. However, no discrete adjacent cortical defect noted. There is diffuse soft tissue swelling of the fifth toe. No discrete soft tissue defect or air within the soft tissue. Findings are equivocal for acute osteomyelitis. Electronically Signed   By: Beula Brunswick M.D.   On: 12/29/2023 19:29        Scheduled Meds:  aspirin  EC  81 mg Oral Daily   atorvastatin   40 mg Oral Daily   Chlorhexidine  Gluconate Cloth  6 each Topical Daily   enoxaparin  (LOVENOX ) injection  40 mg Subcutaneous Q24H   feeding supplement  237 mL Oral BID BM   insulin  aspart  0-15 Units Subcutaneous TID WC   insulin  aspart  0-5 Units Subcutaneous QHS   insulin  glargine-yfgn  5 Units Subcutaneous Q24H   irbesartan   300 mg Oral Daily   pantoprazole   40 mg Oral Daily   Continuous Infusions:  sodium chloride  125 mL/hr at 12/30/23 1610   piperacillin -tazobactam (ZOSYN )  IV 3.375 g (12/30/23 1526)   vancomycin  Stopped (12/30/23 1227)     LOS: 1  day    Time spent: 45 minutes  Hilda Lovings, MD [facility]   To contact the attending provider between 7A-7P or the covering provider during after hours 7P-7A, please log into the web site [email or web address] and access using universal [place] password for that web site. If you do not have the password, please call the hospital operator.  12/30/2023, 4:16 PM

## 2023-12-30 NOTE — Telephone Encounter (Signed)
 Copied from CRM [id number]. Topic: Clinical - Medical Advice >> Dec 30, 2023  9:52 AM Rosaria Common wrote: Reason for CRM: Pt is in ICU, and pt needs a copy of records from December requesting home health care.

## 2023-12-30 NOTE — ED Provider Notes (Signed)
 [name]-ICU/STEPDOWN Provider Note   CSN: [id number] Arrival date & time: 12/29/23  1813     History  Chief Complaint  Patient presents with   Hyperglycemia    [name] is a 68 y.o. male.  67 year old male presenting emergency department for hyperglycemia and left pinky toe pain and swelling.  Reports not taking insulin  for the past 5 days as his power was turned off.  He has had worsening pain to his pinky toe over the past several weeks.  No fevers no chills.  Denies chest pain no shortness of breath.  Reports that he is not sure how he injured his toe but he is concerned for infection.   Hyperglycemia      Home Medications Prior to Admission medications   Medication Sig Start Date End Date Taking? Authorizing Provider  aspirin  EC 81 MG tablet Take 1 tablet (81 mg total) by mouth daily. 11/25/16  Yes Wenona Hamilton, MD  insulin  glargine, 2 Unit Dial, (TOUJEO  MAX SOLOSTAR) 300 UNIT/ML Solostar Pen Inject 150 Units into the skin every morning. Patient taking differently: Inject 100 Units into the skin every morning. 07/02/22  Yes Shamleffer, Ibtehal Jaralla, MD  irbesartan  (AVAPRO ) 300 MG tablet TAKE 1 TABLET BY MOUTH DAILY 07/03/23  Yes Roslyn Coombe, MD  loratadine  (CLARITIN ) 10 MG tablet Take 10 mg by mouth daily as needed.   Yes [provider]  pantoprazole  (PROTONIX ) 40 MG tablet TAKE 1 TABLET BY MOUTH DAILY 05/18/23  Yes Roslyn Coombe, MD  spironolactone  (ALDACTONE ) 25 MG tablet Take 1 tablet by mouth once daily Patient not taking: Reported on 12/29/2023 08/25/23   Lasalle Pointer, NP  VITAMIN D , CHOLECALCIFEROL, PO Take 5,000 Units by mouth daily.   Yes [provider]  atorvastatin  (LIPITOR) 40 MG tablet Take 1 tablet by mouth once daily Patient not taking: Reported on 12/29/2023 04/21/23   Roslyn Coombe, MD  Blood Glucose Monitoring Suppl (ONE TOUCH ULTRA 2) w/Device KIT Use as directed to check blood sugars twice a day  11/25/19   Roslyn Coombe, MD  chlorthalidone  (HYGROTON ) 25 MG tablet Take 1 tablet (25 mg total) by mouth daily. PLEASE CONTACT OFFICE TO SCHEDULE APPT BEFORE FURTHER REFILLS ARE REQUESTED. [phone] (FIRST ATTEMPT) Patient not taking: Reported on 12/29/2023 09/03/23   Wenona Hamilton, MD  citalopram  (CELEXA ) 20 MG tablet Take 1 tablet by mouth once daily Patient not taking: Reported on 12/29/2023 05/22/23   Roslyn Coombe, MD  Lancets [facility] DELICA PLUS [place]) MISC CHECK BLOOD SUGAR TWICE DAILY 09/10/23   Roslyn Coombe, MD  [facility] ULTRA test strip CHECK BLOOD SUGAR TWICE DAILY 11/25/22   Roslyn Coombe, MD  Semaglutide ,0.25 or 0.5MG /DOS, (OZEMPIC , 0.25 OR 0.5 MG/DOSE,) 2 MG/3ML SOPN Inject 0.5 mg into the skin once a week. Patient not taking: Reported on 12/29/2023 07/02/22   Shamleffer, Ibtehal Jaralla, MD      Allergies    Cefepime , Metoprolol , and Myrbetriq  [mirabegron ]    Review of Systems   Review of Systems  Physical Exam Updated Vital Signs BP (!) 86/67   Pulse 83   Temp 98.3 F (36.8 C) (Oral)   Resp 18   SpO2 98%  Physical Exam Vitals and nursing note reviewed.  Constitutional:      Appearance: He is not toxic-appearing.  HENT:     Head: Normocephalic.     Nose: Nose normal.     Mouth/Throat:     Mouth: Mucous  membranes are dry.  Eyes:     Conjunctiva/sclera: Conjunctivae normal.  Cardiovascular:     Rate and Rhythm: Normal rate and regular rhythm.  Pulmonary:     Effort: Pulmonary effort is normal.     Breath sounds: Normal breath sounds.  Abdominal:     General: Abdomen is flat. There is no distension.     Palpations: Abdomen is soft.     Tenderness: There is no abdominal tenderness. There is no guarding or rebound.  Musculoskeletal:     Cervical back: Normal range of motion.     Comments: Left pinky toe with some swelling erythema.  Some discoloration to the distal aspect.  No overt purulent drainage.  2+ DP pulses.  Soft compartments in the left  calf.    Skin:    Capillary Refill: Capillary refill takes less than 2 seconds.  Neurological:     Mental Status: He is alert and oriented to person, place, and time.  Psychiatric:        Mood and Affect: Mood normal.        Behavior: Behavior normal.     ED Results / Procedures / Treatments   Labs (all labs ordered are listed, but only abnormal results are displayed) Labs Reviewed  BASIC METABOLIC PANEL WITH GFR - Abnormal; Notable for the following components:      Result Value   Sodium 123 (*)    Chloride 83 (*)    Glucose, Bld 658 (*)    BUN 26 (*)    All other components within normal limits  BETA-HYDROXYBUTYRIC ACID - Abnormal; Notable for the following components:   Beta-Hydroxybutyric Acid 0.83 (*)    All other components within normal limits  CBC WITH DIFFERENTIAL/PLATELET - Abnormal; Notable for the following components:   WBC 15.0 (*)    MCHC 37.0 (*)    Neutro Abs 11.8 (*)    Abs Immature Granulocytes 0.10 (*)    All other components within normal limits  URINALYSIS, ROUTINE W REFLEX MICROSCOPIC - Abnormal; Notable for the following components:   Color, Urine STRAW (*)    Glucose, UA >=500 (*)    Ketones, ur 5 (*)    Leukocytes,Ua MODERATE (*)    Bacteria, UA RARE (*)    All other components within normal limits  BLOOD GAS, VENOUS - Abnormal; Notable for the following components:   pH, Ven 7.45 (*)    pCO2, Ven 42 (*)    pO2, Ven <31 (*)    Bicarbonate 29.2 (*)    Acid-Base Excess 4.7 (*)    All other components within normal limits  BETA-HYDROXYBUTYRIC ACID - Abnormal; Notable for the following components:   Beta-Hydroxybutyric Acid 0.53 (*)    All other components within normal limits  BASIC METABOLIC PANEL WITH GFR - Abnormal; Notable for the following components:   Sodium 132 (*)    Potassium 3.2 (*)    Chloride 92 (*)    Glucose, Bld 150 (*)    All other components within normal limits  LACTIC ACID, PLASMA - Abnormal; Notable for the following  components:   Lactic Acid, Venous 4.0 (*)    All other components within normal limits  CBG MONITORING, ED - Abnormal; Notable for the following components:   Glucose-Capillary >600 (*)    All other components within normal limits  CBG MONITORING, ED - Abnormal; Notable for the following components:   Glucose-Capillary 448 (*)    All other components within normal limits  CBG MONITORING,  ED - Abnormal; Notable for the following components:   Glucose-Capillary 368 (*)    All other components within normal limits  CBG MONITORING, ED - Abnormal; Notable for the following components:   Glucose-Capillary 144 (*)    All other components within normal limits  CBG MONITORING, ED - Abnormal; Notable for the following components:   Glucose-Capillary 101 (*)    All other components within normal limits  CULTURE, BLOOD (ROUTINE X 2)  CULTURE, BLOOD (ROUTINE X 2)  MRSA NEXT GEN BY PCR, NASAL  BASIC METABOLIC PANEL WITH GFR  BASIC METABOLIC PANEL WITH GFR  HIV ANTIBODY (ROUTINE TESTING W REFLEX)  BASIC METABOLIC PANEL WITH GFR  CBC  LACTIC ACID, PLASMA    EKG EKG Interpretation Date/Time:  Tuesday Dec 29 2023 19:30:17 EDT Ventricular Rate:  77 PR Interval:  153 QRS Duration:  84 QT Interval:  381 QTC Calculation: 432 R Axis:   6  Text Interpretation: Sinus rhythm Atrial premature complexes in couplets Anterior infarct, old Minimal ST elevation, lateral leads Confirmed by Elise Guile [phone]) on 12/29/2023 8:23:19 PM  Radiology DG Foot 2 Views Left Result Date: 12/29/2023 CLINICAL DATA:  pinky toe osteo? EXAM: LEFT FOOT - 2 VIEW COMPARISON:  None Available. FINDINGS: No acute fracture or dislocation. No aggressive osseous lesion. Mild diffuse arthritis of imaged joints. Calcaneal spur noted along the Achilles tendon and Plantar aponeurosis attachment sites. There is subtle ossific density along the medial aspect of the proximal portion of the distal phalanx of fifth toe. However, no  discrete adjacent cortical defect noted. There is diffuse soft tissue swelling of the fifth toe. No discrete soft tissue defect or air within the soft tissue. Findings are equivocal for acute osteomyelitis. Correlate clinically to determine the need for additional imaging with more sensitive modality of MRI. No radiopaque foreign bodies. IMPRESSION: 1. No acute osseous abnormality of the left foot. 2. There is subtle ossific density along the medial aspect of the proximal portion of the distal phalanx of fifth toe. However, no discrete adjacent cortical defect noted. There is diffuse soft tissue swelling of the fifth toe. No discrete soft tissue defect or air within the soft tissue. Findings are equivocal for acute osteomyelitis. Electronically Signed   By: Beula Brunswick M.D.   On: 12/29/2023 19:29    Procedures Procedures    Medications Ordered in ED Medications  insulin  regular, human (MYXREDLIN) 100 units/ 100 mL infusion (0 Units/hr Intravenous Hold 12/29/23 2232)  lactated ringers  infusion ( Intravenous New Bag/Given 12/29/23 2138)  dextrose  5 % in lactated ringers  infusion ( Intravenous New Bag/Given 12/29/23 2235)  dextrose  50 % solution 0-50 mL (has no administration in time range)  vancomycin  (VANCOREADY) IVPB 1500 mg/300 mL (1,500 mg Intravenous New Bag/Given 12/29/23 2230)  irbesartan  (AVAPRO ) tablet 300 mg (300 mg Oral Given 12/29/23 2225)  pantoprazole  (PROTONIX ) EC tablet 40 mg (40 mg Oral Given 12/29/23 2225)  enoxaparin  (LOVENOX ) injection 40 mg (40 mg Subcutaneous Given 12/29/23 2226)  acetaminophen  (TYLENOL ) tablet 650 mg (has no administration in time range)    Or  acetaminophen  (TYLENOL ) suppository 650 mg (has no administration in time range)  oxyCODONE  (Oxy IR/ROXICODONE ) immediate release tablet 5 mg (has no administration in time range)  HYDROmorphone (DILAUDID) injection 0.5-1 mg (has no administration in time range)  ondansetron  (ZOFRAN ) tablet 4 mg (has no administration in  time range)    Or  ondansetron  (ZOFRAN ) injection 4 mg (has no administration in time range)  albuterol  (PROVENTIL ) (2.5 MG/3ML)  0.083% nebulizer solution 2.5 mg (has no administration in time range)  Chlorhexidine  Gluconate Cloth 2 % PADS 6 each (has no administration in time range)  lactated ringers  bolus 1,000 mL (has no administration in time range)  lactated ringers  bolus 1,000 mL (0 mLs Intravenous Stopped 12/29/23 2331)  insulin  aspart (novoLOG ) injection 10 Units (10 Units Subcutaneous Given 12/29/23 1846)  lactated ringers  bolus 1,000 mL (0 mLs Intravenous Stopped 12/29/23 2331)  piperacillin -tazobactam (ZOSYN ) IVPB 3.375 g (0 g Intravenous Stopped 12/30/23 0011)    ED Course/ Medical Decision Making/ A&P Clinical Course as of 12/30/23 0020  Tue Dec 29, 2023  2015 DG Foot 2 Views Left IMPRESSION: 1. No acute osseous abnormality of the left foot. 2. There is subtle ossific density along the medial aspect of the proximal portion of the distal phalanx of fifth toe. However, no discrete adjacent cortical defect noted. There is diffuse soft tissue swelling of the fifth toe. No discrete soft tissue defect or air within the soft tissue. Findings are equivocal for acute osteomyelitis.   [TY]    Clinical Course User Index [TY] Rolinda Climes, DO                                 Medical Decision Making This is a 68 year old male presents the emergency department for hyperglycemia and toe pain.  He is afebrile nontachycardic maintaining oxygen saturation on room air.  On exam concern for possible infection to pinky toe.  X-ray to evaluate with equivocal osteomyelitis.  He is hyperglycemic blood sugar greater than 600.  Does have elevated beta hydroxybutyrate, but pH normal.  Bicarb normal.  No anion gap.  He has a leukocytosis with cellulitis to his pinky toe, questionable osteo.  Does have significant barriers in social determinants of health given his social economic status and no power  at home and does not think he can afford medications.  Patient given IV fluids, subcu insulin  with some improvement of his blood sugars.  Started on insulin  drip.  Will admit patient for hyperglycemia and possible osteomyelitis.  Amount and/or Complexity of Data Reviewed Labs: ordered. Radiology: ordered. Decision-making details documented in ED Course. ECG/medicine tests: ordered.  Risk Prescription drug management. Decision regarding hospitalization.         Final Clinical Impression(s) / ED Diagnoses Final diagnoses:  Hyperglycemia    Rx / DC Orders ED Discharge Orders     None         Rolinda Climes, DO 12/30/23 0020

## 2023-12-30 NOTE — Telephone Encounter (Signed)
 Sorry, I dont know how to address this - ? Perhaps refer pt to Medical Records?

## 2023-12-30 NOTE — Plan of Care (Signed)

## 2023-12-30 NOTE — Progress Notes (Signed)
 Patient continues to tell this nurse that he has won $30 million dollars and 12 trucks and that he keeps sending money to the [place] to get his money. Patient also expresses that he does not have any electricity, water, or phone services. This nurse informed the case manager to see if there are any other services for this patient to assist with financial and home health services.

## 2023-12-30 NOTE — Progress Notes (Signed)
 Patient emergency contact called this nurse, Mont Antis [phone], to obtain update on patient's status. Patient gave this nurse permission to put emergency contact in system.

## 2023-12-30 NOTE — Inpatient Diabetes Management (Signed)
 Inpatient Diabetes Program Recommendations  AACE/ADA: New Consensus Statement on Inpatient Glycemic Control (2015)  Target Ranges:  Prepandial:   less than 140 mg/dL      Peak postprandial:   less than 180 mg/dL (1-2 hours)      Critically ill patients:  140 - 180 mg/dL   Lab Results  Component Value Date   GLUCAP 189 (H) 12/30/2023   HGBA1C 7.2 (A) 07/07/2023    Review of Glycemic Control  Diabetes history: DM2 Outpatient Diabetes medications: Toujeo  100 units in am Current orders for Inpatient glycemic control: Semglee  5 units Q24H, Novolog  0-15 TID with meals and 0-5 HS  HgbA1C pending. Last one 07/07/23 - 7.2%  Inpatient Diabetes Program Recommendations:    Consider adding Novolog  2-3 units TID with meals if eating > 50%  Spoke with pt at bedside regarding his diabetes control. States he hasn't taken his insulin  for awhile, does not have electricity and running water in house. Has no phone and no transportation. Wife died back in the Fall and states his life has fell apart since then. Wanting to leave AMA this afternoon. Pt may benefit from psych consult. Cannot comprehend diabetes info while struggling with all these other issues at this time.  Will follow-up in am.   Thank you. Joni Net, RD, LDN, CDCES Inpatient Diabetes Coordinator [phone]

## 2023-12-30 NOTE — Plan of Care (Signed)
  Problem: Clinical Measurements: Goal: Will remain free from infection Outcome: Progressing Goal: Respiratory complications will improve Outcome: Progressing Goal: Cardiovascular complication will be avoided Outcome: Progressing   Problem: Activity: Goal: Risk for activity intolerance will decrease Outcome: Progressing   Problem: Pain Managment: Goal: General experience of comfort will improve and/or be controlled Outcome: Progressing   Problem: Nutrition: Goal: Adequate nutrition will be maintained Outcome: Adequate for Discharge   Problem: Coping: Goal: Level of anxiety will decrease Outcome: Adequate for Discharge   Problem: Elimination: Goal: Will not experience complications related to bowel motility Outcome: Adequate for Discharge

## 2023-12-30 NOTE — Progress Notes (Signed)
 Pharmacy Antibiotic Note  [name] is a 68 y.o. male admitted on 12/29/2023 with sepsis due to left toe cellulitis.  Pharmacy has been consulted for Vancomycin  and Zosyn  dosing.  Plan: Continue Zosyn  3.375g IV q8h (4 hour infusion). Vancomycin  750 mg IV q12h  (SCr 0.87, TBW<IBQ, est AUC 492) Measure Vanc levels as needed.  Goal AUC = 400 - 550 Follow up renal function, culture results, and clinical course.  Daily SCr while on both Vancomycin  and Zosyn   Height: 5\' 8"  (172.7 cm) Weight: 63.9 kg (140 lb 14 oz) IBW/kg (Calculated) : 68.4  Temp (24hrs), Avg:97.9 F (36.6 C), Min:96.7 F (35.9 C), Max:98.3 F (36.8 C)  Recent Labs  Lab 12/29/23 1839 12/29/23 2220 12/30/23 0311  WBC 15.0*  --  14.4*  CREATININE 1.21 1.02 0.87  LATICACIDVEN  --  4.0* 1.7    Estimated Creatinine Clearance: 74.5 mL/min (by C-G formula based on SCr of 0.87 mg/dL).    Allergies  Allergen Reactions   Cefepime  Hives    Had allergic reaction to one of these 3 agents, unclear which one   Metoprolol  Hives    Had allergic reaction to one of these 3 agents, unclear which one  *Per RN, highly likely this is the cause of allergic rxn   Myrbetriq  [Mirabegron ] Hives    Had allergic reaction to one of these 3 agents, unclear which one    Antimicrobials this admission: 5/20 Zosyn  >>   5/20 Vancomycin   >>    Dose adjustments this admission: 5/21 Vancomycin  empiric renal adjustment  Microbiology results: 5/20 BCx:    5/21 MRSA PCR: negative  Thank you for allowing pharmacy to be a part of this patient's care.  Kendall Pauls PharmD, BCPS [facility] main pharmacy [phone] 12/30/2023 10:22 AM

## 2023-12-30 NOTE — TOC Initial Note (Signed)
 Transition of Care [facility]) - Initial/Assessment Note    Patient Details  Name: [name] MRN: [id number] Date of Birth: [birth year]  [facility]) CM/SW Contact:    Amaryllis Junior, LCSW Phone Number: 12/30/2023, 1:18 PM  Clinical Narrative:                 Pt from home alone. Pt continues medical workup. Financial resources added to AVS. TOC following for dc needs.     Barriers to Discharge: Continued Medical Work up   Patient Goals and CMS Choice Patient states their goals for this hospitalization and ongoing recovery are:: return home CMS Medicare.gov Compare Post Acute Care list provided to::  (NA) Choice offered to / list presented to : NA [facility] ownership interest in [facility].provided to::  (NA)    Expected Discharge Plan and Services In-house Referral: NA Discharge Planning Services: NA   Living arrangements for the past 2 months: Single Family Home                 DME Arranged: N/A DME Agency: NA       HH Arranged: NA HH Agency: NA        Prior Living Arrangements/Services Living arrangements for the past 2 months: Single Family Home Lives with:: Self Patient language and need for interpreter reviewed:: Yes Do you feel safe going back to the place where you live?: Yes      Need for Family Participation in Patient Care: Yes (Comment) Care giver support system in place?: Yes (comment)   Criminal Activity/Legal Involvement Pertinent to Current Situation/Hospitalization: No - Comment as needed  Activities of Daily Living   ADL Screening (condition at time of admission) Independently performs ADLs?: Yes (appropriate for developmental age) Is the patient deaf or have difficulty hearing?: No Does the patient have difficulty seeing, even when wearing glasses/contacts?: No Does the patient have difficulty concentrating, remembering, or making decisions?: No  Permission Sought/Granted                  Emotional  Assessment Appearance:: Appears stated age Attitude/Demeanor/Rapport: Engaged Affect (typically observed): Accepting Orientation: : Oriented to Self, Oriented to Place, Oriented to  Time, Oriented to Situation Alcohol / Substance Use: Not Applicable Psych Involvement: No (comment)  Admission diagnosis:  Hyperglycemia [R73.9] Sepsis due to cellulitis (HCC) [L03.90, A41.9] Patient Active Problem List   Diagnosis Date Noted   Sepsis due to cellulitis (HCC) 12/29/2023   Wheezing 01/13/2023   Right foot pain 04/01/2022   History of colonic polyps 03/12/2022   Chronic diarrhea 03/12/2022   Insulin  dependent type 2 diabetes mellitus (HCC) 02/13/2022   Anxiety 05/01/2021   Aortic atherosclerosis (HCC) 10/25/2020   Vitamin D  deficiency 10/20/2019   Noncompliance with medication regimen - changes himself often 06/28/2019   Elevated troponin    Hypokalemia    Hypomagnesemia    Sepsis (HCC) 06/05/2019   Olecranon bursitis of right elbow 04/21/2019   Chronic left shoulder pain 10/08/2016   Laceration of skin of right lower leg 06/11/2016   Diabetes (HCC) 10/02/2015   Back pain 04/02/2015   Dyslipidemia 01/01/2015   Atypical chest pain 07/18/2014   Encounter for well adult exam with abnormal findings 07/03/2014   PAD (peripheral artery disease) (HCC) 04/05/2014   Folliculitis of scrotum 04/03/2014   Pain in joint, lower leg 04/03/2014   Edema of posterior shaft of penis 01/31/2014   Bladder cancer (HCC) 05/21/2012   Gross hematuria 03/25/2012  Encounter for long-term (current) use of other medications 02/02/2012   Screening for prostate cancer 02/02/2012   Hepatomegaly 09/10/2010   ABNORMAL ELECTROCARDIOGRAM 09/10/2010   FOOT PAIN, LEFT 05/02/2010   Asthma 07/25/2009   Tobacco abuse 05/22/2009   Cough 05/11/2008   Condylomata of penis, groins, perianal - recurrent 01/17/2008   SHOULDER PAIN, LEFT, CHRONIC 01/17/2008   Essential hypertension 03/06/2007   ALLERGIC RHINITIS  03/06/2007   PCP:  Roslyn Coombe, MD Pharmacy:   [facility] [address] [place] - [address] [facility] [place] [address] Phone: [phone] Fax: [phone]  [facility]) - [place], [place] - [address] [facility] [address] [place] [address] [place] [address] Phone: [phone] Fax: [phone]  [facility] - [place], [place] - [address] [place]  [address] Phone: [phone] Fax: [phone]     Social Drivers of Health (SDOH) Social History: SDOH Screenings   Food Insecurity: Food Insecurity Present (12/30/2023)  Housing: High Risk (12/30/2023)  Transportation Needs: Unmet Transportation Needs (12/30/2023)  Utilities: Not At Risk (12/30/2023)  Alcohol Screen: Low Risk  (06/10/2023)  Depression (PHQ2-9): Low Risk  (06/10/2023)  Financial Resource Strain: Low Risk  (06/10/2023)  Physical Activity: Inactive (06/10/2023)  Social Connections: Socially Isolated (12/30/2023)  Stress: No Stress Concern Present (06/10/2023)  Tobacco Use: High Risk (12/29/2023)  Health Literacy: Adequate Health Literacy (06/10/2023)   SDOH Interventions:     Readmission Risk Interventions    12/30/2023    1:17 PM  Readmission Risk Prevention Plan  Post Dischage Appt Complete  Medication Screening Complete  Transportation Screening Complete

## 2023-12-31 ENCOUNTER — Inpatient Hospital Stay (HOSPITAL_COMMUNITY)

## 2023-12-31 DIAGNOSIS — L0390 Cellulitis, unspecified: Secondary | ICD-10-CM | POA: Diagnosis not present

## 2023-12-31 DIAGNOSIS — I7090 Unspecified atherosclerosis: Secondary | ICD-10-CM

## 2023-12-31 DIAGNOSIS — M869 Osteomyelitis, unspecified: Secondary | ICD-10-CM | POA: Diagnosis not present

## 2023-12-31 DIAGNOSIS — F17210 Nicotine dependence, cigarettes, uncomplicated: Secondary | ICD-10-CM

## 2023-12-31 DIAGNOSIS — A419 Sepsis, unspecified organism: Secondary | ICD-10-CM | POA: Diagnosis not present

## 2023-12-31 DIAGNOSIS — M86172 Other acute osteomyelitis, left ankle and foot: Secondary | ICD-10-CM

## 2023-12-31 DIAGNOSIS — I70245 Atherosclerosis of native arteries of left leg with ulceration of other part of foot: Secondary | ICD-10-CM

## 2023-12-31 DIAGNOSIS — Z7982 Long term (current) use of aspirin: Secondary | ICD-10-CM

## 2023-12-31 DIAGNOSIS — I10 Essential (primary) hypertension: Secondary | ICD-10-CM | POA: Diagnosis not present

## 2023-12-31 DIAGNOSIS — B9629 Other Escherichia coli [E. coli] as the cause of diseases classified elsewhere: Secondary | ICD-10-CM

## 2023-12-31 DIAGNOSIS — A040 Enteropathogenic Escherichia coli infection: Secondary | ICD-10-CM | POA: Insufficient documentation

## 2023-12-31 DIAGNOSIS — K529 Noninfective gastroenteritis and colitis, unspecified: Secondary | ICD-10-CM | POA: Diagnosis not present

## 2023-12-31 DIAGNOSIS — Z79899 Other long term (current) drug therapy: Secondary | ICD-10-CM

## 2023-12-31 LAB — GASTROINTESTINAL PANEL BY PCR, STOOL (REPLACES STOOL CULTURE)

## 2023-12-31 LAB — BASIC METABOLIC PANEL WITH GFR
Anion gap: 8 (ref 5–15)
BUN: 15 mg/dL (ref 8–23)
CO2: 22 mmol/L (ref 22–32)
Calcium: 8.5 mg/dL — ABNORMAL LOW (ref 8.9–10.3)
Chloride: 104 mmol/L (ref 98–111)
Creatinine, Ser: 0.93 mg/dL (ref 0.61–1.24)
GFR, Estimated: >60 mL/min (ref >60)
Glucose, Bld: 263 mg/dL — ABNORMAL HIGH (ref 70–99)
Potassium: 4.1 mmol/L (ref 3.5–5.1)
Sodium: 134 mmol/L — ABNORMAL LOW (ref 135–145)

## 2023-12-31 LAB — CBC WITH DIFFERENTIAL/PLATELET
Abs Immature Granulocytes: 0.05 10*3/uL (ref 0.00–0.07)
Basophils Absolute: 0.1 10*3/uL (ref 0.0–0.1)
Basophils Relative: 1 %
Eosinophils Absolute: 0.3 10*3/uL (ref 0.0–0.5)
Eosinophils Relative: 3 %
HCT: 41.9 % (ref 39.0–52.0)
Hemoglobin: 14.6 g/dL (ref 13.0–17.0)
Immature Granulocytes: 1 %
Lymphocytes Relative: 33 %
Lymphs Abs: 2.9 10*3/uL (ref 0.7–4.0)
MCH: 33.5 pg (ref 26.0–34.0)
MCHC: 34.8 g/dL (ref 30.0–36.0)
MCV: 96.1 fL (ref 80.0–100.0)
Monocytes Absolute: 0.7 10*3/uL (ref 0.1–1.0)
Monocytes Relative: 8 %
Neutro Abs: 4.7 10*3/uL (ref 1.7–7.7)
Neutrophils Relative %: 54 %
Platelets: 198 10*3/uL (ref 150–400)
RBC: 4.36 MIL/uL (ref 4.22–5.81)
RDW: 13 % (ref 11.5–15.5)
WBC: 8.8 10*3/uL (ref 4.0–10.5)
nRBC: 0 % (ref 0.0–0.2)

## 2023-12-31 LAB — LIPID PANEL
Cholesterol: 132 mg/dL (ref 0–200)
HDL: 33 mg/dL — ABNORMAL LOW (ref >40)
LDL Cholesterol: 64 mg/dL (ref 0–99)
Total CHOL/HDL Ratio: 4 ratio
Triglycerides: 173 mg/dL — ABNORMAL HIGH (ref <150)
VLDL: 35 mg/dL (ref 0–40)

## 2023-12-31 LAB — HEMOGLOBIN A1C
Hgb A1c MFr Bld: 14.3 % — ABNORMAL HIGH (ref 4.8–5.6)
Mean Plasma Glucose: 363.71 mg/dL

## 2023-12-31 LAB — URINE CULTURE: Culture: NO GROWTH

## 2023-12-31 LAB — MAGNESIUM: Magnesium: 1.8 mg/dL (ref 1.7–2.4)

## 2023-12-31 LAB — GLUCOSE, CAPILLARY
Glucose-Capillary: 251 mg/dL — ABNORMAL HIGH (ref 70–99)
Glucose-Capillary: 302 mg/dL — ABNORMAL HIGH (ref 70–99)
Glucose-Capillary: 154 mg/dL — ABNORMAL HIGH (ref 70–99)
Glucose-Capillary: 343 mg/dL — ABNORMAL HIGH (ref 70–99)

## 2023-12-31 MED ORDER — IRBESARTAN 150 MG PO TABS
150.0000 mg | ORAL_TABLET | Freq: Every day | ORAL | Status: DC
  Administered 2023-12-31: 150 mg via ORAL
  Filled 2023-12-31: qty 1

## 2023-12-31 MED ORDER — QUETIAPINE FUMARATE 25 MG PO TABS: 25.0000 mg | ORAL_TABLET | Freq: Every evening | ORAL | Status: DC | PRN

## 2023-12-31 MED ORDER — INSULIN GLARGINE-YFGN 100 UNIT/ML ~~LOC~~ SOLN
10.0000 [IU] | Freq: Every day | SUBCUTANEOUS | Status: DC
  Administered 2023-12-31 – 2024-01-02 (×3): 10 [IU] via SUBCUTANEOUS
  Filled 2023-12-31 (×3): qty 0.1

## 2023-12-31 MED ORDER — MAGNESIUM SULFATE 2 GM/50ML IV SOLN
2.0000 g | Freq: Once | INTRAVENOUS | Status: AC
  Administered 2023-12-31: 2 g via INTRAVENOUS
  Filled 2023-12-31: qty 50

## 2023-12-31 NOTE — Progress Notes (Signed)
 PROGRESS NOTE    [name]  ZOX:[id number] DOB: [birth year] DOA: 12/29/2023 PCP: Roslyn Coombe, MD   Chief Complaint  Patient presents with   Hyperglycemia    Brief Narrative:  Patient 68 year old gentleman history of hypertension, hyperlipidemia, CAD, insulin -dependent type 2 diabetes admitted to the hospital with sepsis found secondary to left toe wound/cellulitis and noted to be hyperglycemic without acidosis.  Patient placed on the Endo tool transition to subcutaneous insulin  and sliding scale insulin .  Patient placed empirically on IV antibiotics.  MRI obtained of the left foot concerning for osteomyelitis of the toe.  Podiatry to be consulted.   Assessment & Plan:   Principal Problem:   Sepsis due to cellulitis [facility]) Active Problems:   Osteomyelitis of fifth toe of left foot (HCC)   Essential hypertension   Dyslipidemia   Diabetes (HCC)   Hypokalemia   Insulin  dependent type 2 diabetes mellitus (HCC)   Chronic diarrhea   Uncontrolled type 2 diabetes mellitus with hyperglycemia, with long-term current use of insulin  (HCC)   Enteritis, enteropathogenic E. coli  #1 sepsis secondary to left toe cellulitis/left toe osteomyelitis, POA -Patient met criteria for sepsis on admission with tachycardia, leukocytosis, and source concerning for left fifth toe cellulitis/osteomyelitis with no evidence of acute endorgan damage. - Sepsis was likely etiology of patient's hyperglycemia. - Blood cultures obtained and pending with no growth to date. - MRSA PCR negative. - MRI of the left foot with osteomyelitis of the distal phalanx of the small toe, edema in the medial sesamoid first digit with a transverse cleft in the medial sesamoid, possibilities include sesamoid fracture versus sesamoiditis in the setting of incidental bifid sesamoid.  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  Accentuated cutaneous enhancement in the small  toe potentially from cellulitis.  Mild degenerative arthropathy along the proximal lateral portion of the medial cuneiform. - Continue empiric IV vancomycin , IV Zosyn . -Podiatry consultation pending. -Will likely also need ID input for antibiotic duration and recommendations.  2.  Hyperglycemia without acidosis - Felt likely multifactorial secondary to medication noncompliance and acute infection. - Patient noted on admission to have blood glucose as high as 658, with no acidosis noted, normal anion gap. - Patient placed on Endo tool and has been transition to subcutaneous insulin  of Semglee  5 units daily with sliding scale insulin . - CBG noted at 251 this morning. - Per med rec complete patient was on Toujeo  100 units daily however patient was transition to Semglee  5 units daily with blood glucose levels ranging from 118 -282.   -Hemoglobin A1c at 14.3. - Increase Semglee  to 10 units daily.  - Continue SSI. - Diabetes coordinator following.  3.  Hypertension -Patient noted not to have taking medications for at least several days however does state admitting physician he took his irbesartan . - BP soft. - Decrease Avapro  to 150 mg daily. - Continue to hold chlorthalidone , spironolactone  which patient was supposed to be on per med rec.  4.  Hyperlipidemia -Noted to have not been on any medications for past  few days prior to admission. - Per med rec patient not on any medications. - Fasting lipid panel with a LDL of 64.   - Outpatient follow-up with PCP.   5.  Diarrhea secondary to enteropathogenic E. coli -Per RN patient with watery loose stools this morning. - Patient however states was constipated prior to admission.  And feels he just had a blowout. - C. difficile  PCR negative.  GI pathogen panel positive for enteropathogenic E. coli.   - Patient on empiric IV antibiotics due to osteomyelitis.   - Supportive care.    6.  Pseudohyponatremia -Secondary to hyperglycemia. -  Improved with correction of hyperglycemia and IV fluids.  7.  Hypokalemia -Magnesium  at 1.8. - Potassium repleted. - Keep magnesium  approximately 2.  TOC consultation placed to assist patient with his housing situation including recent loss of power, phone, E TC.   DVT prophylaxis: Lovenox  Code Status: Full Family Communication: Updated patient.  No family at bedside. Disposition: TBD  Status is: Inpatient Remains inpatient appropriate because: Severity of illness   Consultants:  Podiatry pending  Procedures: Plain films of the left foot 12/29/2023 MRI left foot 12/30/2023   Antimicrobials:  Anti-infectives (From admission, onward)    Start     Dose/Rate Route Frequency Ordered Stop   12/30/23 2200  Vancomycin  (VANCOCIN ) 1,250 mg in sodium chloride  0.9 % 250 mL IVPB  Status:  Discontinued        1,250 mg 166.7 mL/hr over 90 Minutes Intravenous Every 24 hours 12/30/23 0215 12/30/23 1023   12/30/23 1200  vancomycin  (VANCOREADY) IVPB 750 mg/150 mL        750 mg 150 mL/hr over 60 Minutes Intravenous Every 12 hours 12/30/23 1023     12/30/23 0600  piperacillin -tazobactam (ZOSYN ) IVPB 3.375 g        3.375 g 12.5 mL/hr over 240 Minutes Intravenous Every 8 hours 12/30/23 0212     12/29/23 2100  vancomycin  (VANCOREADY) IVPB 1500 mg/300 mL        1,500 mg 150 mL/hr over 120 Minutes Intravenous  Once 12/29/23 2051 12/30/23 0158   12/29/23 2100  piperacillin -tazobactam (ZOSYN ) IVPB 3.375 g        3.375 g 100 mL/hr over 30 Minutes Intravenous  Once 12/29/23 2051 12/30/23 0011         Subjective: Patient walking around in room dressed up.  States he can lay in that bed for another day and he has animals at home that needed to be taking care of.  Denies any chest pain or shortness of breath.  Feels IV antibiotics is causing diarrhea.  Tolerating oral intake.    Objective: Vitals:   12/31/23 0400 12/31/23 0800 12/31/23 0829 12/31/23 0906  BP: (!) 145/59 (!) 152/58  (!) 148/86   Pulse: (!) 58 77 66 71  Resp: (!) 9 (!) 21 13 19   Temp:  97.8 F (36.6 C)    TempSrc:  Oral    SpO2: 94% 97% 96% 99%  Weight:      Height:        Intake/Output Summary (Last 24 hours) at 12/31/2023 1036 Last data filed at 12/31/2023 0906 Gross per 24 hour  Intake 4267.25 ml  Output 4600 ml  Net -332.75 ml   Filed Weights   12/30/23 0010  Weight: 63.9 kg    Examination:  General exam: NAD Respiratory system: Lungs clear to auscultation bilaterally.  No wheezes, no crackles, no rhonchi.  Fair air movement.  Speaking in full sentences.   Cardiovascular system: Regular rate rhythm no murmurs rubs or gallops.  No JVD.  No pitting lower extremity edema.  Gastrointestinal system: Abdomen is soft, nontender, nondistended, positive bowel sounds.  No rebound.  No guarding.  Central nervous system: Alert and oriented. No focal neurological deficits. Extremities: Left foot with some erythema on the left toe, tender to palpation, some erythema.  No significant drainage noted. Skin:  No rashes, lesions or ulcers Psychiatry: Judgement and insight appear normal. Mood & affect appropriate.     Data Reviewed: I have personally reviewed following labs and imaging studies  CBC: Recent Labs  Lab 12/29/23 1839 12/30/23 0311 12/31/23 0312  WBC 15.0* 14.4* 8.8  NEUTROABS 11.8*  --  4.7  HGB 16.5 15.6 14.6  HCT 44.6 43.6 41.9  MCV 89.9 93.6 96.1  PLT 270 212 198    Basic Metabolic Panel: Recent Labs  Lab 12/29/23 1839 12/29/23 2220 12/30/23 0311 12/31/23 0312  NA 123* 132* 132* 134*  K 4.0 3.2* 3.3* 4.1  CL 83* 92* 94* 104  CO2 25 28 28 22   GLUCOSE 658* 150* 137* 263*  BUN 26* 23 20 15   CREATININE 1.21 1.02 0.87 0.93  CALCIUM  9.4 9.4 9.2 8.5*  MG  --   --  1.8 1.8    GFR: Estimated Creatinine Clearance: 69.7 mL/min (by C-G formula based on SCr of 0.93 mg/dL).  Liver Function Tests: No results for input(s): "AST", "ALT", "ALKPHOS", "BILITOT", "PROT", "ALBUMIN" in the  last 168 hours.  CBG: Recent Labs  Lab 12/30/23 0809 12/30/23 1122 12/30/23 1631 12/30/23 2136 12/31/23 0738  GLUCAP 151* 189* 282* 219* 251*     Recent Results (from the past 240 hours)  Culture, blood (routine x 2)     Status: None (Preliminary result)   Collection Time: 12/29/23  7:31 PM   Specimen: BLOOD RIGHT ARM  Result Value Ref Range Status   Specimen Description   Final    BLOOD RIGHT ARM Performed at [facility], [address], [place], [place] [address]    Special Requests   Final    BOTTLES DRAWN AEROBIC AND ANAEROBIC Blood Culture adequate volume Performed at [facility], [address] [place], [place] [address]    Culture   Final    NO GROWTH 2 DAYS Performed at [facility], [address], [place], [place] [address]    Report Status PENDING  Incomplete  Culture, blood (routine x 2)     Status: None (Preliminary result)   Collection Time: 12/29/23  7:31 PM   Specimen: BLOOD LEFT ARM  Result Value Ref Range Status   Specimen Description   Final    BLOOD LEFT ARM Performed at [facility], [address], [place], [place] [address]    Special Requests   Final    BOTTLES DRAWN AEROBIC AND ANAEROBIC Blood Culture results may not be optimal due to an inadequate volume of blood received in culture bottles Performed at [facility], [address], [place], [place] [address]    Culture   Final    NO GROWTH 2 DAYS Performed at [facility], [address] [place], [place] [address]    Report Status PENDING  Incomplete  MRSA Next Gen by PCR, Nasal     Status: None   Collection Time: 12/30/23 12:10 AM   Specimen: Nasal Mucosa; Nasal Swab  Result Value Ref Range Status   MRSA by PCR Next Gen NOT DETECTED NOT DETECTED Final    Comment: (NOTE) The GeneXpert MRSA Assay (FDA approved for NASAL specimens only), is one component of a comprehensive MRSA colonization surveillance program. It is  not intended to diagnose MRSA infection nor to guide or monitor treatment for MRSA infections. Test performance is not FDA approved in patients less than 34 years old. Performed at [facility], [address] [place], [place] [address]   C Difficile Quick Screen w PCR reflex     Status: None   Collection Time: 12/30/23 10:00 AM   Specimen: STOOL  Result Value Ref Range Status   C Diff antigen NEGATIVE NEGATIVE Final   C Diff toxin NEGATIVE NEGATIVE Final   C Diff interpretation No C. difficile detected.  Final    Comment: Performed at [facility], [address], [place], [place] [address]  Gastrointestinal Panel by PCR , Stool     Status: Abnormal   Collection Time: 12/30/23 10:00 AM   Specimen: STOOL  Result Value Ref Range Status   Campylobacter species NOT DETECTED NOT DETECTED Final   Plesimonas shigelloides NOT DETECTED NOT DETECTED Final   Salmonella species NOT DETECTED NOT DETECTED Final   Yersinia enterocolitica NOT DETECTED NOT DETECTED Final   Vibrio species NOT DETECTED NOT DETECTED Final   Vibrio cholerae NOT DETECTED NOT DETECTED Final   Enteroaggregative E coli (EAEC) NOT DETECTED NOT DETECTED Final   Enteropathogenic E coli (EPEC) DETECTED (A) NOT DETECTED Final    Comment: RESULT CALLED TO, READ BACK BY AND VERIFIED WITH: Jennie Moeller, RN 12/31/23 0931 MW    Enterotoxigenic E coli (ETEC) NOT DETECTED NOT DETECTED Final   Shiga like toxin producing E coli (STEC) NOT DETECTED NOT DETECTED Final   Shigella/Enteroinvasive E coli (EIEC) NOT DETECTED NOT DETECTED Final   Cryptosporidium NOT DETECTED NOT DETECTED Final   Cyclospora cayetanensis NOT DETECTED NOT DETECTED Final   Entamoeba histolytica NOT DETECTED NOT DETECTED Final   Giardia lamblia NOT DETECTED NOT DETECTED Final   Adenovirus F40/41 NOT DETECTED NOT DETECTED Final   Astrovirus NOT DETECTED NOT DETECTED Final   Norovirus GI/GII NOT DETECTED NOT DETECTED Final    Rotavirus A NOT DETECTED NOT DETECTED Final   Sapovirus (I, II, IV, and V) NOT DETECTED NOT DETECTED Final    Comment: Performed at [facility], [address] [place], [place] [address]         Radiology Studies: MR FOOT LEFT W WO CONTRAST Result Date: 12/30/2023 CLINICAL DATA:  Open wound along the small toe, possible osteomyelitis EXAM: MRI OF THE LEFT FOREFOOT WITHOUT AND WITH CONTRAST TECHNIQUE: Multiplanar, multisequence MR imaging of the left forefoot was performed both before and after administration of intravenous contrast. CONTRAST:  6mL GADAVIST GADOBUTROL 1 MMOL/ML IV SOLN COMPARISON:  12/29/2023 FINDINGS: Bones/Joint/Cartilage Abnormal edema and enhancement in the distal phalanx small toe compatible with osteomyelitis. Edema in the medial sesamoid of the first digit with a transverse cleft in the medial sesamoid, possibilities include sesamoid fracture versus sesamoiditis in the setting of incidental bifid sesamoid. Mild degenerative arthropathy along the proximal lateral portion of the medial cuneiform. Ligaments Lisfranc ligament intact. Muscles and Tendons Fusiform thickening of the distal medial band of the plantar fascia on image 10 series 7 compatible with plantar fibromatosis. Soft tissues Accentuated cutaneous enhancement in the small toe potentially from cellulitis. IMPRESSION: 1. Osteomyelitis of the distal phalanx small toe. 2. Edema in the medial sesamoid first digit with a transverse cleft in the medial sesamoid, possibilities include sesamoid fracture versus sesamoiditis in the setting of incidental bifid sesamoid. 3. 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 4. Accentuated cutaneous enhancement in the small toe potentially from cellulitis. 5. Mild degenerative arthropathy along the proximal lateral portion of the medial cuneiform. Electronically Signed   By: Tanner Fanny.D.  On: 12/30/2023  14:08   DG Foot 2 Views Left Result Date: 12/29/2023 CLINICAL DATA:  pinky toe osteo? EXAM: LEFT FOOT - 2 VIEW COMPARISON:  None Available. FINDINGS: No acute fracture or dislocation. No aggressive osseous lesion. Mild diffuse arthritis of imaged joints. Calcaneal spur noted along the Achilles tendon and Plantar aponeurosis attachment sites. There is subtle ossific density along the medial aspect of the proximal portion of the distal phalanx of fifth toe. However, no discrete adjacent cortical defect noted. There is diffuse soft tissue swelling of the fifth toe. No discrete soft tissue defect or air within the soft tissue. Findings are equivocal for acute osteomyelitis. Correlate clinically to determine the need for additional imaging with more sensitive modality of MRI. No radiopaque foreign bodies. IMPRESSION: 1. No acute osseous abnormality of the left foot. 2. There is subtle ossific density along the medial aspect of the proximal portion of the distal phalanx of fifth toe. However, no discrete adjacent cortical defect noted. There is diffuse soft tissue swelling of the fifth toe. No discrete soft tissue defect or air within the soft tissue. Findings are equivocal for acute osteomyelitis. Electronically Signed   By: Beula Brunswick M.D.   On: 12/29/2023 19:29        Scheduled Meds:  aspirin  EC  81 mg Oral Daily   atorvastatin   40 mg Oral Daily   calcium  carbonate  1 tablet Oral TID   Chlorhexidine  Gluconate Cloth  6 each Topical Daily   enoxaparin  (LOVENOX ) injection  40 mg Subcutaneous Q24H   feeding supplement  237 mL Oral BID BM   guaiFENesin  600 mg Oral BID   insulin  aspart  0-15 Units Subcutaneous TID WC   insulin  aspart  0-5 Units Subcutaneous QHS   insulin  glargine-yfgn  10 Units Subcutaneous Daily   irbesartan   150 mg Oral Daily   pantoprazole   40 mg Oral Daily   Continuous Infusions:  magnesium  sulfate bolus IVPB     piperacillin -tazobactam (ZOSYN )  IV Stopped (12/31/23 0905)    vancomycin  Stopped (12/31/23 0204)     LOS: 2 days    Time spent: 40 minutes    Hilda Lovings, MD [facility]   To contact the attending provider between 7A-7P or the covering provider during after hours 7P-7A, please log into the web site [email or web address] and access using universal [place] password for that web site. If you do not have the password, please call the hospital operator.  12/31/2023, 10:36 AM

## 2023-12-31 NOTE — Consult Note (Signed)
 PODIATRY CONSULTATION  NAME [name] MRN [id number] DOB 1956-06-16 DOA 12/29/2023   Reason for consult:  L 5th toe ulcer  Attending/Consulting physician: D. Hildy Lowers MD  History of present illness: "Patient 68 year old gentleman history of hypertension, hyperlipidemia, CAD, insulin -dependent type 2 diabetes admitted to the hospital with sepsis found secondary to left toe wound/cellulitis and noted to be hyperglycemic without acidosis. Patient placed on the Endo tool transition to subcutaneous insulin  and sliding scale insulin . Patient placed empirically on IV antibiotics. MRI obtained of the left foot concerning for osteomyelitis of the toe. Podiatry to be consulted. "  Discussed findings with patient re concern for osteomyelitis of 5th toe on left foot. I discussed recommendation for amputation of the toe. He says he wants to proceed as soon as possible. Has to get home to his animals and had lots of other business to attend. He does report some pain in his left foot related to the wound / 5th toe.  Past Medical History:  Diagnosis Date   Adenomatous colon polyp    Allergic rhinitis    At risk for sleep apnea    STOP-BANG= 5   SENT TO PCP 03-14-2014   Bladder cancer (HCC)    CAD (coronary artery disease)    a. 07/2014 low risk MV; b. 07/2019 Cor CTA (FFR): LM 25-49 (nl), LAD mild prox/mid plaque (nl), D1 25-49p(nl w/ abnl FFR of 0.73 in inf branch), LCX/OM1 mild prox/mid plaque (nl), RCA nondominant, minimal Ca2+ plaque (nl), RPDA (mildly abnl @ 0.79)-->Med Rx..   Cataract    surgically removed bilateral   Condyloma acuminatum of penis    Diabetic neuropathy (HCC)    Diastolic dysfunction    a. 05/2019 Echo: EF 60-65%, no rwam, mod LVH, impaired relaxation, nl RV size/fxn, trace MR, triv TR.   Diverticulosis    GERD (gastroesophageal reflux disease)    History of bladder cancer    s/p  turbt  2013/   transitional cell carcinoma--    History of condyloma acuminatum     PERINEAL AREA  W/ RECURRENCY   History of gout    Hyperlipidemia    Hypertension    Lower urinary tract symptoms (LUTS)    PAF (paroxysmal atrial fibrillation) (HCC)    a. 06/2019 Event monitor: PAF <1% burden. Longest 3 mins 36 secs.   Productive cough    PSVT (paroxysmal supraventricular tachycardia) (HCC)    a. 06/2019 Event monitor: 112 episodes of SVT, longest 21 secs.   PVD (peripheral vascular disease) with claudication (HCC)    a. 03/2014 LE art duplex: long segment occlusion of mid to distal R SFA; b. 03/2020 ABI: nl left and mildly improved R ABI->med rx.   Renal artery stenosis (HCC)    Renal artery stenosis (HCC)    a. 12/2020 Renal art duplex: RRA 1-59%, LRA >60%.   Smokers' cough (HCC)    Type 2 diabetes mellitus with insulin  therapy (HCC) 1992   monitor by  dr ellsion   Wears dentures    upper       Latest Ref Rng & Units 12/31/2023    3:12 AM 12/30/2023    3:11 AM 12/29/2023    6:39 PM  CBC  WBC 4.0 - 10.5 K/uL 8.8  14.4  15.0   Hemoglobin 13.0 - 17.0 g/dL 62.1  30.8  65.7   Hematocrit 39.0 - 52.0 % 41.9  43.6  44.6   Platelets 150 - 400 K/uL 198  212  270  Latest Ref Rng & Units 12/31/2023    3:12 AM 12/30/2023    3:11 AM 12/29/2023   10:20 PM  BMP  Glucose 70 - 99 mg/dL 161  096  045   BUN 8 - 23 mg/dL 15  20  23    Creatinine 0.61 - 1.24 mg/dL 4.09  8.11  9.14   Sodium 135 - 145 mmol/L 134  132  132   Potassium 3.5 - 5.1 mmol/L 4.1  3.3  3.2   Chloride 98 - 111 mmol/L 104  94  92   CO2 22 - 32 mmol/L 22  28  28    Calcium  8.9 - 10.3 mg/dL 8.5  9.2  9.4       Physical Exam: Lower Extremity Exam Left foot palpable DP and PT pulses Cap fill intact to L 5th toe and normal warmth  Sensation diminished to toes  Necrotic tissue and ucleration present at the distal medial aspect of 5th toe with drainage and malodor  No gross deformity, mild to moderate edmea     ASSESSMENT/PLAN OF CARE 68 y.o. male with PMHx significant for  hypertension,  hyperlipidemia, CAD, uncontrolled insulin -dependent type 2 diabetes who presented with hyperglycemia and sepsis 2/2 ulceration and osteomyelitis of the L 5th toe.   WBC 8.8 (14.4) A1c 14.3 MRI L foot: Osteomyelitis of the distal phalanx L 5th toe  ABI: 0.89 L, absent waveform at great toe. R ABI 0.41  - NPO past MN for OR tomorrow AM for L 5th toe amputation. He is in agreement.  - Do recommend vascular consult given ABI on RLE - he will at a minimum need close outpatient follow up for this. Given palpable pedal pulses and intact warmth / cap refill on left foot feel likely ok to proceed with amp without vascular intervention unless vascular team feels otherwise.  - Continue IV abx broad spectrum pending further culture data - Anticoagulation: ok to continue per primary - Wound care: None required pre op - WB status: WBAT in post op shoe following OR - Will continue to follow   Thank you for the consult.  Please contact me directly with any questions or concerns.           Maridee Shoemaker, DPM [facility] / [facility]    [address] [place], [place] [address]                Office [phone]  Fax [phone]

## 2023-12-31 NOTE — Consult Note (Signed)
 Hospital Consult    Reason for Consult: Toe wound with osteomyelitis Requesting Service/Physician: Dr. Rosemarie [name]  MRN #:  [id number]  History of Present Illness: This is a 68 y.o. male with a history of HLD, HTN, CAD and diabetes who presented to the hospital with sepsis found to have a left fifth toe wound and osteomyelitis on MRI.  Denies any previous vascular inventions.  He denies claudication, rest pain or any previous history of slow or nonhealing wounds.  He is a current smoker.  Past Medical History:  Diagnosis Date   Adenomatous colon polyp    Allergic rhinitis    At risk for sleep apnea    STOP-BANG= 5   SENT TO PCP 03-14-2014   Bladder cancer (HCC)    CAD (coronary artery disease)    a. 07/2014 low risk MV; b. 07/2019 Cor CTA (FFR): LM 25-49 (nl), LAD mild prox/mid plaque (nl), D1 25-49p(nl w/ abnl FFR of 0.73 in inf branch), LCX/OM1 mild prox/mid plaque (nl), RCA nondominant, minimal Ca2+ plaque (nl), RPDA (mildly abnl @ 0.79)-->Med Rx..   Cataract    surgically removed bilateral   Condyloma acuminatum of penis    Diabetic neuropathy (HCC)    Diastolic dysfunction    a. 05/2019 Echo: EF 60-65%, no rwam, mod LVH, impaired relaxation, nl RV size/fxn, trace MR, triv TR.   Diverticulosis    GERD (gastroesophageal reflux disease)    History of bladder cancer    s/p  turbt  2013/   transitional cell carcinoma--    History of condyloma acuminatum    PERINEAL AREA  W/ RECURRENCY   History of gout    Hyperlipidemia    Hypertension    Lower urinary tract symptoms (LUTS)    PAF (paroxysmal atrial fibrillation) (HCC)    a. 06/2019 Event monitor: PAF <1% burden. Longest 3 mins 36 secs.   Productive cough    PSVT (paroxysmal supraventricular tachycardia) (HCC)    a. 06/2019 Event monitor: 112 episodes of SVT, longest 21 secs.   PVD (peripheral vascular disease) with claudication (HCC)    a. 03/2014 LE art duplex: long segment occlusion of mid to distal R SFA; b. 03/2020 ABI:  nl left and mildly improved R ABI->med rx.   Renal artery stenosis (HCC)    Renal artery stenosis (HCC)    a. 12/2020 Renal art duplex: RRA 1-59%, LRA >60%.   Smokers' cough (HCC)    Type 2 diabetes mellitus with insulin  therapy (HCC) 1992   monitor by  dr ellsion   Wears dentures    upper    Past Surgical History:  Procedure Laterality Date   AXILLARY HIDRADENITIS EXCISION  1997   CARDIOVASCULAR STRESS TEST  07-24-2014  dr Antionette Kirks   Low risk lexiscan  nuclear study with apical thinning and small inferolateral wall infarct at mid & basal level , no ischemia/  normal LVF and wall motion , ef 59%   CATARACT EXTRACTION Left    CATARACT EXTRACTION W/ INTRAOCULAR LENS IMPLANT Right    CO2 LASER APPLICATION N/A 03/20/2014   Procedure: CO2 LASER APPLICATION,PENIS, GROIN, ANUS;  Surgeon: Eddye Goodie, MD;  Location: [facility];  Service: General;  Laterality: N/A;   CO2 LASER APPLICATION N/A 05/21/2015   Procedure: CO2 LASER APPLICATION;  Surgeon: Mark Ottelin, MD;  Location: [facility];  Service: Urology;  Laterality: N/A;   CONDYLOMA EXCISION/FULGURATION N/A 05/21/2015   Procedure: CONDYLOMA REMOVAL;  Surgeon: Mark Ottelin, MD;  Location: [facility];  Service: Urology;  Laterality: N/A;   HEMORRHOID SURGERY  10/24/2014   Procedure: HEMORRHOIDECTOMY;  Surgeon: Candyce Champagne, MD;  Location: [facility];  Service: General;;   INCISION AND DRAINAGE ABSCESS Left 10/24/2014   Procedure: INCISION AND DRAINAGE ABSCESS;  Surgeon: Candyce Champagne, MD;  Location: [facility];  Service: General;  Laterality: Left;   INGUINAL HIDRADENITIS EXCISION  1998, 1999   LASER ABLATION CONDOLAMATA N/A 03/20/2014   Procedure: EXAM UNDER ANESTHESIA, REMOVAL/ABLATION OF CONDYLOMATA PENIS,GROINS, ANUS, ANAL CANAL;  Surgeon: Eddye Goodie, MD;  Location: [facility];  Service: General;  Laterality: N/A;  groin and anus    LASER ABLATION CONDOLAMATA N/A 10/24/2014   Procedure: LASER ABLATION CONDOLAMATA;  Surgeon: Candyce Champagne, MD;  Location: [facility];  Service: General;  Laterality: N/A;   LASER ABLATION OF PENILE AND PERIANAL WARTS  07-29-2007  Dr. Hershell Lose   LEFT SHOULDER SURGERY  2003   MASS EXCISION N/A 10/24/2014   Procedure: EXCISION OF PERINEAL MASS/SINUS;  Surgeon: Candyce Champagne, MD;  Location: [facility] [place];  Service: General;  Laterality: N/A;   MOHS SURGERY     back   MOHS SURGERY  2017   face   MULTIPLE TOOTH EXTRACTIONS     PERINEAL HIDRADENITIS EXCISION  1998, 1999   TRANSURETHRAL RESECTION OF BLADDER TUMOR  05/21/2012   Procedure: TRANSURETHRAL RESECTION OF BLADDER TUMOR (TURBT);  Surgeon: Mark C Ottelin, MD;  Location: [facility];  Service: Urology;  Laterality: N/A;       Allergies  Allergen Reactions   Cefepime  Hives    Had allergic reaction to one of these 3 agents, unclear which one   Metoprolol  Hives    Had allergic reaction to one of these 3 agents, unclear which one  *Per RN, highly likely this is the cause of allergic rxn   Myrbetriq  [Mirabegron ] Hives    Had allergic reaction to one of these 3 agents, unclear which one    Prior to Admission medications   Medication Sig Start Date End Date Taking? Authorizing Provider  aspirin  EC 81 MG tablet Take 1 tablet (81 mg total) by mouth daily. 11/25/16  Yes Wenona Hamilton, MD  insulin  glargine, 2 Unit Dial, (TOUJEO  MAX SOLOSTAR) 300 UNIT/ML Solostar Pen Inject 150 Units into the skin every morning. Patient taking differently: Inject 100 Units into the skin every morning. 07/02/22  Yes Shamleffer, Ibtehal Jaralla, MD  irbesartan  (AVAPRO ) 300 MG tablet TAKE 1 TABLET BY MOUTH DAILY 07/03/23  Yes Roslyn Coombe, MD  loratadine  (CLARITIN ) 10 MG tablet Take 10 mg by mouth daily as needed.   Yes [provider]  pantoprazole  (PROTONIX ) 40 MG tablet TAKE 1 TABLET BY MOUTH DAILY 05/18/23  Yes  Roslyn Coombe, MD  spironolactone  (ALDACTONE ) 25 MG tablet Take 1 tablet by mouth once daily Patient not taking: Reported on 12/29/2023 08/25/23   Lasalle Pointer, NP  VITAMIN D , CHOLECALCIFEROL, PO Take 5,000 Units by mouth daily.   Yes [provider]  atorvastatin  (LIPITOR) 40 MG tablet Take 1 tablet by mouth once daily Patient not taking: Reported on 12/29/2023 04/21/23   Roslyn Coombe, MD  Blood Glucose Monitoring Suppl (ONE TOUCH ULTRA 2) w/Device KIT Use as directed to check blood sugars twice a day 11/25/19   Roslyn Coombe, MD  chlorthalidone  (HYGROTON ) 25 MG tablet Take 1 tablet (25 mg total) by mouth daily. PLEASE CONTACT OFFICE TO SCHEDULE APPT BEFORE  FURTHER REFILLS ARE REQUESTED. [phone]) [phone] (FIRST ATTEMPT) Patient not taking: Reported on 12/29/2023 09/03/23   Wenona Hamilton, MD  citalopram  (CELEXA ) 20 MG tablet Take 1 tablet by mouth once daily Patient not taking: Reported on 12/29/2023 05/22/23   Roslyn Coombe, MD  Lancets [facility] DELICA PLUS [place]) MISC CHECK BLOOD SUGAR TWICE DAILY 09/10/23   Roslyn Coombe, MD  [facility] ULTRA test strip CHECK BLOOD SUGAR TWICE DAILY 11/25/22   Roslyn Coombe, MD  Semaglutide ,0.25 or 0.5MG /DOS, (OZEMPIC , 0.25 OR 0.5 MG/DOSE,) 2 MG/3ML SOPN Inject 0.5 mg into the skin once a week. Patient not taking: Reported on 12/29/2023 07/02/22   Shamleffer, Julian Obey, MD    Social History   Socioeconomic History   Marital status: Widowed    Spouse name: Not on file   Number of children: 0   Years of education: Not on file   Highest education level: Not on file  Occupational History   Occupation: Disabled/retired  Tobacco Use   Smoking status: Every Day    Current packs/day: 0.00    Average packs/day: 1 pack/day for 41.0 years (41.0 ttl pk-yrs)    Types: Cigars, Cigarettes    Start date: 50    Last attempt to quit: 2017    Years since quitting: 8.3   Smokeless tobacco: Never   Tobacco comments:    Quit cigarettes in 2017 but  started back smoking Cigars  Vaping Use   Vaping status: Former  Substance and Sexual Activity   Alcohol use: No    Alcohol/week: 0.0 standard drinks of alcohol   Drug use: No   Sexual activity: Not Currently  Other Topics Concern   Not on file  Social History Narrative   Not on file   Social Drivers of Health   Financial Resource Strain: Low Risk  (06/10/2023)   Overall Financial Resource Strain (CARDIA)    Difficulty of Paying Living Expenses: Not very hard  Food Insecurity: Food Insecurity Present (12/30/2023)   Hunger Vital Sign    Worried About Running Out of Food in the Last Year: Often true    Ran Out of Food in the Last Year: Often true  Transportation Needs: Unmet Transportation Needs (12/30/2023)   PRAPARE - Transportation    Lack of Transportation (Medical): Yes    Lack of Transportation (Non-Medical): Yes  Physical Activity: Inactive (06/10/2023)   Exercise Vital Sign    Days of Exercise per Week: 0 days    Minutes of Exercise per Session: 0 min  Stress: No Stress Concern Present (06/10/2023)   [employer] of Occupational Health - Occupational Stress Questionnaire    Feeling of Stress : Not at all  Social Connections: Socially Isolated (12/30/2023)   Social Connection and Isolation Panel [NHANES]    Frequency of Communication with Friends and Family: Never    Frequency of Social Gatherings with Friends and Family: Never    Attends Religious Services: Never    Database administrator or Organizations: No    Attends Banker Meetings: Never    Marital Status: Widowed  Intimate Partner Violence: Not At Risk (12/30/2023)   Humiliation, Afraid, Rape, and Kick questionnaire    Fear of Current or Ex-Partner: No    Emotionally Abused: No    Physically Abused: No    Sexually Abused: No    Family History  Problem Relation Age of Onset   Hypertension Mother    Lung cancer Father    Diabetes Maternal Aunt  x 2   Colon cancer Neg Hx     Esophageal cancer Neg Hx    Pancreatic cancer Neg Hx    Prostate cancer Neg Hx    Kidney disease Neg Hx    Liver disease Neg Hx    Rectal cancer Neg Hx    Stomach cancer Neg Hx     ROS: Otherwise negative unless mentioned in HPI  Physical Examination  Vitals:   12/31/23 1227 12/31/23 1316  BP: (!) 127/97 (!) 155/75  Pulse: 81 75  Resp: 20 16  Temp: 97.7 F (36.5 C) 98 F (36.7 C)  SpO2: 98% 99%   Body mass index is 21.42 kg/m.  General: no acute distress Cardiac: hemodynamically stable Pulm: normal work of breathing Abdomen: non-tender, no pulsatile mass  Neuro: alert, no focal deficit Extremities: Left fifth toe wound as pictured  Vascular:   Right: palpable femoral  Left: palpable femoral    Data:   +---------+------------------+-----+----------+--------+  Right   Rt Pressure (mmHg)IndexWaveform  Comment   +---------+------------------+-----+----------+--------+  Brachial 160                    triphasic           +---------+------------------+-----+----------+--------+  PTA     65                0.41 monophasic          +---------+------------------+-----+----------+--------+  DP      62                0.39 monophasic          +---------+------------------+-----+----------+--------+  Great Toe                                 Absent    +---------+------------------+-----+----------+--------+   +---------+------------------+-----+-----------+-------+  Left    Lt Pressure (mmHg)IndexWaveform   Comment  +---------+------------------+-----+-----------+-------+  Brachial 132                    triphasic           +---------+------------------+-----+-----------+-------+  PTA     143               0.89 multiphasic         +---------+------------------+-----+-----------+-------+  DP      110               0.69 multiphasic         +---------+------------------+-----+-----------+-------+  Great Toe                                   Absent   +---------+------------------+-----+-----------+-------+   +-------+-----------+-----------+------------+------------+  ABI/TBIToday's ABIToday's TBIPrevious ABIPrevious TBI  +-------+-----------+-----------+------------+------------+  Right 0.41                                            +-------+-----------+-----------+------------+------------+  Left  0.89                                            +-------+-----------+-----------+------------+------------+     ASSESSMENT/PLAN: This is a 68 y.o. male with CL TI and a left fifth  toe wound with osteomyelitis.  We discussed with absent toe pressures he likely does not have adequate perfusion to heal his toe amputation.  Therefore I offered bilateral lower extremity angiogram with possible left lower extremity intervention.  Risk and benefits were reviewed, he expressed understanding and elected to proceed.  This will be done tomorrow in the Cath Lab. N.p.o. midnight Consent ordered   Philipp Brawn MD [facility] [phone] 12/31/2023  5:33 PM

## 2023-12-31 NOTE — Consult Note (Addendum)
 [facility]  Total days of antibiotics 3       Reason for Consult: osteomyelitis of left 5th toe    Referring Physician: Hildy Lowers  Principal Problem:   Sepsis due to cellulitis [facility]) Active Problems:   Essential hypertension   Dyslipidemia   Diabetes (HCC)   Hypokalemia   Insulin  dependent type 2 diabetes mellitus (HCC)   Chronic diarrhea   Osteomyelitis of fifth toe of left foot (HCC)   Uncontrolled type 2 diabetes mellitus with hyperglycemia, with long-term current use of insulin  (HCC)   Enteritis, enteropathogenic E. coli   Acute osteomyelitis of toe, left (HCC)    HPI: [name] is a 68 y.o. male with IDDM, HTN,HLD, chronic smoker who was admitted on 5/20 with worsening pain to his left foot after stubbing his foot. He states that this past week there is no power in his home and has not taken his medications. He reports worsening tenderness to his left foot. On admit, he was afebrile, but had leukocytosis of 14.4K on exam, heavy onychomycosis to his toe nails and distal aspect of 5th toe of left foot has an ischemic eschar with surrounding erythema. He was started on vancomycin  and piptazo empircally. Mri confirms suspicion of osteomyelitis with OM to distal phalanx of small toe, edema to surrounding area. Patient is agreeable to amputation in the morning to help with source control. ABI are pending. 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  No blood in stool no cramping but just frequent BM.  He reports having financial difficulty since wife passed away 6 months ago.   Past Medical History:  Diagnosis Date   Adenomatous colon polyp    Allergic rhinitis    At risk for sleep apnea    STOP-BANG= 5   SENT TO PCP 03-14-2014   Bladder cancer (HCC)    CAD (coronary artery disease)    a. 07/2014 low risk MV; b. 07/2019 Cor CTA (FFR): LM 25-49 (nl), LAD mild prox/mid plaque (nl), D1  25-49p(nl w/ abnl FFR of 0.73 in inf branch), LCX/OM1 mild prox/mid plaque (nl), RCA nondominant, minimal Ca2+ plaque (nl), RPDA (mildly abnl @ 0.79)-->Med Rx..   Cataract    surgically removed bilateral   Condyloma acuminatum of penis    Diabetic neuropathy (HCC)    Diastolic dysfunction    a. 05/2019 Echo: EF 60-65%, no rwam, mod LVH, impaired relaxation, nl RV size/fxn, trace MR, triv TR.   Diverticulosis    GERD (gastroesophageal reflux disease)    History of bladder cancer    s/p  turbt  2013/   transitional cell carcinoma--    History of condyloma acuminatum    PERINEAL AREA  W/ RECURRENCY   History of gout    Hyperlipidemia    Hypertension    Lower urinary tract symptoms (LUTS)    PAF (paroxysmal atrial fibrillation) (HCC)    a. 06/2019 Event monitor: PAF <1% burden. Longest 3 mins 36 secs.   Productive cough    PSVT (paroxysmal supraventricular tachycardia) (HCC)    a. 06/2019 Event monitor: 112 episodes of SVT, longest 21 secs.   PVD (peripheral vascular disease) with claudication (HCC)    a. 03/2014 LE art duplex: long segment occlusion of mid to distal R SFA; b. 03/2020 ABI: nl left and mildly improved R ABI->med rx.   Renal artery stenosis [facility])    Renal artery stenosis (HCC)  a. 12/2020 Renal art duplex: RRA 1-59%, LRA >60%.   Smokers' cough (HCC)    Type 2 diabetes mellitus with insulin  therapy (HCC) 1992   monitor by  dr ellsion   Wears dentures    upper    Allergies:  Allergies  Allergen Reactions   Cefepime  Hives    Had allergic reaction to one of these 3 agents, unclear which one   Metoprolol  Hives    Had allergic reaction to one of these 3 agents, unclear which one  *Per RN, highly likely this is the cause of allergic rxn   Myrbetriq  [Mirabegron ] Hives    Had allergic reaction to one of these 3 agents, unclear which one     MEDICATIONS:  aspirin  EC  81 mg Oral Daily   atorvastatin   40 mg Oral Daily   calcium  carbonate  1 tablet Oral TID    Chlorhexidine  Gluconate Cloth  6 each Topical Daily   enoxaparin  (LOVENOX ) injection  40 mg Subcutaneous Q24H   feeding supplement  237 mL Oral BID BM   guaiFENesin  600 mg Oral BID   insulin  aspart  0-15 Units Subcutaneous TID WC   insulin  aspart  0-5 Units Subcutaneous QHS   insulin  glargine-yfgn  10 Units Subcutaneous Daily   irbesartan   150 mg Oral Daily   pantoprazole   40 mg Oral Daily    Social History   Tobacco Use   Smoking status: Every Day    Current packs/day: 0.00    Average packs/day: 1 pack/day for 41.0 years (41.0 ttl pk-yrs)    Types: Cigars, Cigarettes    Start date: 75    Last attempt to quit: 2017    Years since quitting: 8.3   Smokeless tobacco: Never   Tobacco comments:    Quit cigarettes in 2017 but started back smoking Cigars  Vaping Use   Vaping status: Former  Substance Use Topics   Alcohol use: No    Alcohol/week: 0.0 standard drinks of alcohol   Drug use: No    Family History  Problem Relation Age of Onset   Hypertension Mother    Lung cancer Father    Diabetes Maternal Aunt        x 2   Colon cancer Neg Hx    Esophageal cancer Neg Hx    Pancreatic cancer Neg Hx    Prostate cancer Neg Hx    Kidney disease Neg Hx    Liver disease Neg Hx    Rectal cancer Neg Hx    Stomach cancer Neg Hx     Review of Systems -  12 point ros is negative except what is mentioned above.  OBJECTIVE: Temp:  [97.7 F (36.5 C)-98.4 F (36.9 C)] 97.7 F (36.5 C) (05/22 1227) Pulse Rate:  [49-81] 81 (05/22 1227) Resp:  [9-26] 20 (05/22 1227) BP: (92-156)/(37-97) 127/97 (05/22 1227) SpO2:  [84 %-100 %] 98 % (05/22 1227) Physical Exam  Constitutional: He is oriented to person, place, and time. He appears well-developed and well-nourished. No distress.  HENT:  Mouth/Throat: Oropharynx is clear and moist. No oropharyngeal exudate.  Cardiovascular: Normal rate, regular rhythm and normal heart sounds. Exam reveals no gallop and no friction rub.  No murmur  heard.  Pulmonary/Chest: Effort normal and breath sounds normal. No respiratory distress. He has no wheezes.  Abdominal: Soft. Bowel sounds are normal. He exhibits no distension. There is no tenderness.  Lymphadenopathy:  He has no cervical adenopathy.  Neurological: He is alert and oriented to  person, place, and time.  Skin: Skin is warm and dry. Ext: left 5th toe eschar, surrounding erythema. Onychomycosis, thickened nail beds to other toes Psychiatric: He has a normal mood and affect. His behavior is normal. Tangential thinking  LABS: Results for orders placed or performed during the hospital encounter of 12/29/23 (from the past 48 hours)  CBG monitoring, ED     Status: Abnormal   Collection Time: 12/29/23  6:19 PM  Result Value Ref Range   Glucose-Capillary >600 (HH) 70 - 99 mg/dL    Comment: Glucose reference range applies only to samples taken after fasting for at least 8 hours.  Urinalysis, Routine w reflex microscopic -Urine, Clean Catch     Status: Abnormal   Collection Time: 12/29/23  6:35 PM  Result Value Ref Range   Color, Urine STRAW (A) YELLOW   APPearance CLEAR CLEAR   Specific Gravity, Urine 1.021 1.005 - 1.030   pH 6.0 5.0 - 8.0   Glucose, UA >=500 (A) NEGATIVE mg/dL   Hgb urine dipstick NEGATIVE NEGATIVE   Bilirubin Urine NEGATIVE NEGATIVE   Ketones, ur 5 (A) NEGATIVE mg/dL   Protein, ur NEGATIVE NEGATIVE mg/dL   Nitrite NEGATIVE NEGATIVE   Leukocytes,Ua MODERATE (A) NEGATIVE   RBC / HPF 0-5 0 - 5 RBC/hpf   WBC, UA 0-5 0 - 5 WBC/hpf   Bacteria, UA RARE (A) NONE SEEN   Squamous Epithelial / HPF 0-5 0 - 5 /HPF    Comment: Performed at [facility], [address] [place], [place] [address]  Basic metabolic panel     Status: Abnormal   Collection Time: 12/29/23  6:39 PM  Result Value Ref Range   Sodium 123 (L) 135 - 145 mmol/L   Potassium 4.0 3.5 - 5.1 mmol/L   Chloride 83 (L) 98 - 111 mmol/L   CO2 25 22 - 32 mmol/L   Glucose, Bld 658 (HH)  70 - 99 mg/dL    Comment: CRITICAL RESULT CALLED TO, READ BACK BY AND VERIFIED WITH S. LAMB, RN 12/29/23 1914 J. COLE Glucose reference range applies only to samples taken after fasting for at least 8 hours.    BUN 26 (H) 8 - 23 mg/dL   Creatinine, Ser 6.04 0.61 - 1.24 mg/dL   Calcium  9.4 8.9 - 10.3 mg/dL   GFR, Estimated >54 >09 mL/min    Comment: (NOTE) Calculated using the CKD-EPI Creatinine Equation (2021)    Anion gap 15 5 - 15    Comment: Performed at [facility], [address], [place], [place] [address]  Beta-hydroxybutyric acid     Status: Abnormal   Collection Time: 12/29/23  6:39 PM  Result Value Ref Range   Beta-Hydroxybutyric Acid 0.83 (H) 0.05 - 0.27 mmol/L    Comment: Performed at [facility], [address], [place], [place] [address]  CBC with Differential (PNL)     Status: Abnormal   Collection Time: 12/29/23  6:39 PM  Result Value Ref Range   WBC 15.0 (H) 4.0 - 10.5 K/uL   RBC 4.96 4.22 - 5.81 MIL/uL   Hemoglobin 16.5 13.0 - 17.0 g/dL   HCT 56.2 13.0 - 86.5 %   MCV 89.9 80.0 - 100.0 fL   MCH 33.3 26.0 - 34.0 pg   MCHC 37.0 (H) 30.0 - 36.0 g/dL   RDW 78.4 69.6 - 29.5 %   Platelets 270 150 - 400 K/uL   nRBC 0.0 0.0 - 0.2 %  Neutrophils Relative % 77 %   Neutro Abs 11.8 (H) 1.7 - 7.7 K/uL   Lymphocytes Relative 13 %   Lymphs Abs 1.9 0.7 - 4.0 K/uL   Monocytes Relative 7 %   Monocytes Absolute 1.0 0.1 - 1.0 K/uL   Eosinophils Relative 1 %   Eosinophils Absolute 0.1 0.0 - 0.5 K/uL   Basophils Relative 1 %   Basophils Absolute 0.1 0.0 - 0.1 K/uL   Immature Granulocytes 1 %   Abs Immature Granulocytes 0.10 (H) 0.00 - 0.07 K/uL    Comment: Performed at [facility], [address], [place], [place] [address]  Blood gas, venous     Status: Abnormal   Collection Time: 12/29/23  6:39 PM  Result Value Ref Range   pH, Ven 7.45 (H) 7.25 - 7.43   pCO2, Ven 42 (L) 44 - 60 mmHg   pO2, Ven <31 (LL) 32 - 45  mmHg    Comment: CRITICAL RESULT CALLED TO, READ BACK BY AND VERIFIED WITH: S. LAMB, RN 12/29/23 1914 J. COLE    Bicarbonate 29.2 (H) 20.0 - 28.0 mmol/L   Acid-Base Excess 4.7 (H) 0.0 - 2.0 mmol/L   O2 Saturation 49.9 %   Patient temperature 37.0     Comment: Performed at [facility], [address] [place], [place] [address]  Culture, blood (routine x 2)     Status: None (Preliminary result)   Collection Time: 12/29/23  7:31 PM   Specimen: BLOOD RIGHT ARM  Result Value Ref Range   Specimen Description      BLOOD RIGHT ARM Performed at [facility], [address] [place], [place] [address]    Special Requests      BOTTLES DRAWN AEROBIC AND ANAEROBIC Blood Culture adequate volume Performed at [facility], [address], [place], [place] [address]    Culture      NO GROWTH 2 DAYS Performed at [facility], [address], [place], [place] [address]    Report Status PENDING   Culture, blood (routine x 2)     Status: None (Preliminary result)   Collection Time: 12/29/23  7:31 PM   Specimen: BLOOD LEFT ARM  Result Value Ref Range   Specimen Description      BLOOD LEFT ARM Performed at [facility], [address], [place], [place] [address]    Special Requests      BOTTLES DRAWN AEROBIC AND ANAEROBIC Blood Culture results may not be optimal due to an inadequate volume of blood received in culture bottles Performed at [facility], [address] [place], [place] [address]    Culture      NO GROWTH 2 DAYS Performed at [facility], [address], [place], [place] [address]    Report Status PENDING   Beta-hydroxybutyric acid     Status: Abnormal   Collection Time: 12/29/23  7:31 PM  Result Value Ref Range   Beta-Hydroxybutyric Acid 0.53 (H) 0.05 - 0.27 mmol/L    Comment: Performed at [facility], [address], [place], [place] [address]  CBG monitoring, ED     Status:  Abnormal   Collection Time: 12/29/23  8:24 PM  Result Value Ref Range   Glucose-Capillary 448 (H) 70 - 99 mg/dL    Comment: Glucose reference range applies only to samples taken after fasting for at least 8 hours.  POC CBG, ED  Status: Abnormal   Collection Time: 12/29/23  9:30 PM  Result Value Ref Range   Glucose-Capillary 368 (H) 70 - 99 mg/dL    Comment: Glucose reference range applies only to samples taken after fasting for at least 8 hours.  Basic metabolic panel     Status: Abnormal   Collection Time: 12/29/23 10:20 PM  Result Value Ref Range   Sodium 132 (L) 135 - 145 mmol/L    Comment: DELTA CHECK NOTED   Potassium 3.2 (L) 3.5 - 5.1 mmol/L    Comment: DELTA CHECK NOTED   Chloride 92 (L) 98 - 111 mmol/L   CO2 28 22 - 32 mmol/L   Glucose, Bld 150 (H) 70 - 99 mg/dL    Comment: Glucose reference range applies only to samples taken after fasting for at least 8 hours.   BUN 23 8 - 23 mg/dL   Creatinine, Ser 1.61 0.61 - 1.24 mg/dL   Calcium  9.4 8.9 - 10.3 mg/dL   GFR, Estimated >09 >60 mL/min    Comment: (NOTE) Calculated using the CKD-EPI Creatinine Equation (2021)    Anion gap 12 5 - 15    Comment: Performed at [facility], [address] [place], [place] [address]  HIV Antibody (routine testing w rflx)     Status: None   Collection Time: 12/29/23 10:20 PM  Result Value Ref Range   HIV Screen 4th Generation wRfx Non Reactive Non Reactive    Comment: Performed at [facility], [address] [place], [place] [address]  Lactic acid, plasma     Status: Abnormal   Collection Time: 12/29/23 10:20 PM  Result Value Ref Range   Lactic Acid, Venous 4.0 (HH) 0.5 - 1.9 mmol/L    Comment: CRITICAL RESULT CALLED TO, READ BACK BY AND VERIFIED WITH BLACK, M PM @ 2258 ON 12/29/2023 BY MTA Performed at [facility], [address] [place], [place] [address]   CBG monitoring, ED     Status: Abnormal   Collection Time: 12/29/23 10:27 PM   Result Value Ref Range   Glucose-Capillary 144 (H) 70 - 99 mg/dL    Comment: Glucose reference range applies only to samples taken after fasting for at least 8 hours.  CBG monitoring, ED     Status: Abnormal   Collection Time: 12/29/23 11:37 PM  Result Value Ref Range   Glucose-Capillary 101 (H) 70 - 99 mg/dL    Comment: Glucose reference range applies only to samples taken after fasting for at least 8 hours.  MRSA Next Gen by PCR, Nasal     Status: None   Collection Time: 12/30/23 12:10 AM   Specimen: Nasal Mucosa; Nasal Swab  Result Value Ref Range   MRSA by PCR Next Gen NOT DETECTED NOT DETECTED    Comment: (NOTE) The GeneXpert MRSA Assay (FDA approved for NASAL specimens only), is one component of a comprehensive MRSA colonization surveillance program. It is not intended to diagnose MRSA infection nor to guide or monitor treatment for MRSA infections. Test performance is not FDA approved in patients less than 25 years old. Performed at [facility], [address], [place], [place] [address]   Glucose, capillary     Status: None   Collection Time: 12/30/23 12:53 AM  Result Value Ref Range   Glucose-Capillary 95 70 - 99 mg/dL    Comment: Glucose reference range applies only to samples taken after fasting for at least 8 hours.  Glucose, capillary  Status: Abnormal   Collection Time: 12/30/23  2:02 AM  Result Value Ref Range   Glucose-Capillary 118 (H) 70 - 99 mg/dL    Comment: Glucose reference range applies only to samples taken after fasting for at least 8 hours.  Glucose, capillary     Status: Abnormal   Collection Time: 12/30/23  3:06 AM  Result Value Ref Range   Glucose-Capillary 148 (H) 70 - 99 mg/dL    Comment: Glucose reference range applies only to samples taken after fasting for at least 8 hours.  Basic metabolic panel     Status: Abnormal   Collection Time: 12/30/23  3:11 AM  Result Value Ref Range   Sodium 132 (L) 135 - 145 mmol/L    Potassium 3.3 (L) 3.5 - 5.1 mmol/L   Chloride 94 (L) 98 - 111 mmol/L   CO2 28 22 - 32 mmol/L   Glucose, Bld 137 (H) 70 - 99 mg/dL    Comment: Glucose reference range applies only to samples taken after fasting for at least 8 hours.   BUN 20 8 - 23 mg/dL   Creatinine, Ser 4.09 0.61 - 1.24 mg/dL   Calcium  9.2 8.9 - 10.3 mg/dL   GFR, Estimated >81 >19 mL/min    Comment: (NOTE) Calculated using the CKD-EPI Creatinine Equation (2021)    Anion gap 10 5 - 15    Comment: Performed at [facility], [address] [place], [place] [address]  CBC     Status: Abnormal   Collection Time: 12/30/23  3:11 AM  Result Value Ref Range   WBC 14.4 (H) 4.0 - 10.5 K/uL   RBC 4.66 4.22 - 5.81 MIL/uL   Hemoglobin 15.6 13.0 - 17.0 g/dL   HCT 95.6 21.3 - 08.6 %   MCV 93.6 80.0 - 100.0 fL   MCH 33.5 26.0 - 34.0 pg   MCHC 35.8 30.0 - 36.0 g/dL   RDW 57.8 46.9 - 62.9 %   Platelets 212 150 - 400 K/uL   nRBC 0.0 0.0 - 0.2 %    Comment: Performed at [facility], [address], [place], [place] [address]  Lactic acid, plasma     Status: None   Collection Time: 12/30/23  3:11 AM  Result Value Ref Range   Lactic Acid, Venous 1.7 0.5 - 1.9 mmol/L    Comment: Performed at [facility], [address], [place], [place] [address]  Magnesium      Status: None   Collection Time: 12/30/23  3:11 AM  Result Value Ref Range   Magnesium  1.8 1.7 - 2.4 mg/dL    Comment: Performed at [facility], [address], [place], [place] [address]  Glucose, capillary     Status: Abnormal   Collection Time: 12/30/23  8:09 AM  Result Value Ref Range   Glucose-Capillary 151 (H) 70 - 99 mg/dL    Comment: Glucose reference range applies only to samples taken after fasting for at least 8 hours.  C Difficile Quick Screen w PCR reflex     Status: None   Collection Time: 12/30/23 10:00 AM   Specimen: STOOL  Result Value Ref Range   C Diff antigen NEGATIVE  NEGATIVE   C Diff toxin NEGATIVE NEGATIVE   C Diff interpretation No C. difficile detected.     Comment: Performed at [facility], [address] [place], [place] [address]  Gastrointestinal Panel by PCR , Stool     Status:  Abnormal   Collection Time: 12/30/23 10:00 AM   Specimen: STOOL  Result Value Ref Range   Campylobacter species NOT DETECTED NOT DETECTED   Plesimonas shigelloides NOT DETECTED NOT DETECTED   Salmonella species NOT DETECTED NOT DETECTED   Yersinia enterocolitica NOT DETECTED NOT DETECTED   Vibrio species NOT DETECTED NOT DETECTED   Vibrio cholerae NOT DETECTED NOT DETECTED   Enteroaggregative E coli (EAEC) NOT DETECTED NOT DETECTED   Enteropathogenic E coli (EPEC) DETECTED (A) NOT DETECTED    Comment: RESULT CALLED TO, READ BACK BY AND VERIFIED WITH: Jennie Moeller, RN 12/31/23 0931 MW    Enterotoxigenic E coli (ETEC) NOT DETECTED NOT DETECTED   Shiga like toxin producing E coli (STEC) NOT DETECTED NOT DETECTED   Shigella/Enteroinvasive E coli (EIEC) NOT DETECTED NOT DETECTED   Cryptosporidium NOT DETECTED NOT DETECTED   Cyclospora cayetanensis NOT DETECTED NOT DETECTED   Entamoeba histolytica NOT DETECTED NOT DETECTED   Giardia lamblia NOT DETECTED NOT DETECTED   Adenovirus F40/41 NOT DETECTED NOT DETECTED   Astrovirus NOT DETECTED NOT DETECTED   Norovirus GI/GII NOT DETECTED NOT DETECTED   Rotavirus A NOT DETECTED NOT DETECTED   Sapovirus (I, II, IV, and V) NOT DETECTED NOT DETECTED    Comment: Performed at [facility], [address] [place], [place] [address]  Glucose, capillary     Status: Abnormal   Collection Time: 12/30/23 11:22 AM  Result Value Ref Range   Glucose-Capillary 189 (H) 70 - 99 mg/dL    Comment: Glucose reference range applies only to samples taken after fasting for at least 8 hours.   Comment 1 Notify RN    Comment 2 Document in Chart   Glucose, capillary     Status: Abnormal   Collection Time: 12/30/23   4:31 PM  Result Value Ref Range   Glucose-Capillary 282 (H) 70 - 99 mg/dL    Comment: Glucose reference range applies only to samples taken after fasting for at least 8 hours.   Comment 1 Notify RN    Comment 2 Document in Chart   Glucose, capillary     Status: Abnormal   Collection Time: 12/30/23  9:36 PM  Result Value Ref Range   Glucose-Capillary 219 (H) 70 - 99 mg/dL    Comment: Glucose reference range applies only to samples taken after fasting for at least 8 hours.   Comment 1 Notify RN    Comment 2 Document in Chart   Hemoglobin A1c     Status: Abnormal   Collection Time: 12/31/23  3:12 AM  Result Value Ref Range   Hgb A1c MFr Bld 14.3 (H) 4.8 - 5.6 %    Comment: (NOTE) Pre diabetes:          5.7%-6.4%  Diabetes:              >6.4%  Glycemic control for   <7.0% adults with diabetes    Mean Plasma Glucose 363.71 mg/dL    Comment: Performed at [facility], [address] [place], [place] [address]  Lipid panel     Status: Abnormal   Collection Time: 12/31/23  3:12 AM  Result Value Ref Range   Cholesterol 132 0 - 200 mg/dL   Triglycerides 956 (H) <150 mg/dL   HDL 33 (L) >21 mg/dL   Total CHOL/HDL Ratio 4.0 RATIO   VLDL 35 0 - 40 mg/dL   LDL Cholesterol 64 0 - 99 mg/dL    Comment:  Total Cholesterol/HDL:CHD Risk Coronary Heart Disease Risk Table                     Men   Women  1/2 Average Risk   3.4   3.3  Average Risk       5.0   4.4  2 X Average Risk   9.6   7.1  3 X Average Risk  23.4   11.0        Use the calculated Patient Ratio above and the CHD Risk Table to determine the patient's CHD Risk.        ATP III CLASSIFICATION (LDL):  <100     mg/dL   Optimal  782-956  mg/dL   Near or Above                    Optimal  130-159  mg/dL   Borderline  213-086  mg/dL   High  >578     mg/dL   Very High Performed at [facility], [address] [place], [place] [address]   CBC with Differential/Platelet     Status: None    Collection Time: 12/31/23  3:12 AM  Result Value Ref Range   WBC 8.8 4.0 - 10.5 K/uL   RBC 4.36 4.22 - 5.81 MIL/uL   Hemoglobin 14.6 13.0 - 17.0 g/dL   HCT 95.2 84.1 - 32.4 %   MCV 96.1 80.0 - 100.0 fL   MCH 33.5 26.0 - 34.0 pg   MCHC 34.8 30.0 - 36.0 g/dL   RDW 40.1 02.7 - 25.3 %   Platelets 198 150 - 400 K/uL   nRBC 0.0 0.0 - 0.2 %   Neutrophils Relative % 54 %   Neutro Abs 4.7 1.7 - 7.7 K/uL   Lymphocytes Relative 33 %   Lymphs Abs 2.9 0.7 - 4.0 K/uL   Monocytes Relative 8 %   Monocytes Absolute 0.7 0.1 - 1.0 K/uL   Eosinophils Relative 3 %   Eosinophils Absolute 0.3 0.0 - 0.5 K/uL   Basophils Relative 1 %   Basophils Absolute 0.1 0.0 - 0.1 K/uL   Immature Granulocytes 1 %   Abs Immature Granulocytes 0.05 0.00 - 0.07 K/uL    Comment: Performed at [facility], [address] [place], [place] [address]  Basic metabolic panel     Status: Abnormal   Collection Time: 12/31/23  3:12 AM  Result Value Ref Range   Sodium 134 (L) 135 - 145 mmol/L   Potassium 4.1 3.5 - 5.1 mmol/L   Chloride 104 98 - 111 mmol/L   CO2 22 22 - 32 mmol/L   Glucose, Bld 263 (H) 70 - 99 mg/dL    Comment: Glucose reference range applies only to samples taken after fasting for at least 8 hours.   BUN 15 8 - 23 mg/dL   Creatinine, Ser 3.47 0.61 - 1.24 mg/dL   Calcium  8.5 (L) 8.9 - 10.3 mg/dL   GFR, Estimated >42 >59 mL/min    Comment: (NOTE) Calculated using the CKD-EPI Creatinine Equation (2021)    Anion gap 8 5 - 15    Comment: Performed at [facility], [address] [place], [place] [address]  Magnesium      Status: None   Collection Time: 12/31/23  3:12 AM  Result Value Ref Range   Magnesium  1.8 1.7 - 2.4 mg/dL    Comment: Performed at [facility], [address] [place], [place] [address]  Glucose, capillary     Status: Abnormal   Collection Time: 12/31/23  7:38 AM  Result Value Ref Range   Glucose-Capillary 251 (H) 70 - 99 mg/dL     Comment: Glucose reference range applies only to samples taken after fasting for at least 8 hours.   Comment 1 Notify RN    Comment 2 Document in Chart   Glucose, capillary     Status: Abnormal   Collection Time: 12/31/23 11:53 AM  Result Value Ref Range   Glucose-Capillary 302 (H) 70 - 99 mg/dL    Comment: Glucose reference range applies only to samples taken after fasting for at least 8 hours.   Comment 1 Notify RN    Comment 2 Document in Chart     MICRO: reviewed IMAGING: VAS US  ABI WITH/WO TBI Result Date: 12/31/2023  LOWER EXTREMITY DOPPLER STUDY Patient Name:  [name]  Date of Exam:   12/31/2023 Medical Rec #: [id number]      Accession #:    [id number] Date of Birth: 03/05/1956      Patient Gender: M Patient Age:   50 years Exam Location:  [facility] Procedure:      VAS US  ABI WITH/WO TBI Referring Phys: --------------------------------------------------------------------------------  Indications: Peripheral artery disease. High Risk Factors: Hypertension, Diabetes.  Comparison Study: 03/28/2020 - Right: Resting right ankle-brachial index                   indicates moderate right lower extremity arterial disease. The                   right toe-brachial index is abnormal.                    Left: Resting left ankle-brachial index is within normal                   range. No evidence of significant left lower extremity                   arterial disease. The left toe-brachial index is normal. Performing Technologist: Birda Buffy RVT  Examination Guidelines: A complete evaluation includes at minimum, Doppler waveform signals and systolic blood pressure reading at the level of bilateral brachial, anterior tibial, and posterior tibial arteries, when vessel segments are accessible. Bilateral testing is considered an integral part of a complete examination. Photoelectric Plethysmograph (PPG) waveforms and toe systolic pressure readings are included as required and additional duplex  testing as needed. Limited examinations for reoccurring indications may be performed as noted.  ABI Findings: +---------+------------------+-----+----------+--------+ Right    Rt Pressure (mmHg)IndexWaveform  Comment  +---------+------------------+-----+----------+--------+ Brachial 160                    triphasic          +---------+------------------+-----+----------+--------+ PTA      65                0.41 monophasic         +---------+------------------+-----+----------+--------+ DP       62                0.39 monophasic         +---------+------------------+-----+----------+--------+ Great Toe                                 Absent   +---------+------------------+-----+----------+--------+ +---------+------------------+-----+-----------+-------+  Left     Lt Pressure (mmHg)IndexWaveform   Comment +---------+------------------+-----+-----------+-------+ Brachial 132                    triphasic          +---------+------------------+-----+-----------+-------+ PTA      143               0.89 multiphasic        +---------+------------------+-----+-----------+-------+ DP       110               0.69 multiphasic        +---------+------------------+-----+-----------+-------+ Great Toe                                  Absent  +---------+------------------+-----+-----------+-------+ +-------+-----------+-----------+------------+------------+ ABI/TBIToday's ABIToday's TBIPrevious ABIPrevious TBI +-------+-----------+-----------+------------+------------+ Right  0.41                                           +-------+-----------+-----------+------------+------------+ Left   0.89                                           +-------+-----------+-----------+------------+------------+  Summary: Right: Resting right ankle-brachial index indicates severe right lower extremity arterial disease. Unable to obtain TBI due to absent waveforms. Left:  Resting left ankle-brachial index indicates mild left lower extremity arterial disease. ABI's are likely falsely elevated due to medial calcification. Unable to obtain TBI due to absent waveforms. *See table(s) above for measurements and observations.     Preliminary    MR FOOT LEFT W WO CONTRAST Result Date: 12/30/2023 CLINICAL DATA:  Open wound along the small toe, possible osteomyelitis EXAM: MRI OF THE LEFT FOREFOOT WITHOUT AND WITH CONTRAST TECHNIQUE: Multiplanar, multisequence MR imaging of the left forefoot was performed both before and after administration of intravenous contrast. CONTRAST:  6mL GADAVIST GADOBUTROL 1 MMOL/ML IV SOLN COMPARISON:  12/29/2023 FINDINGS: Bones/Joint/Cartilage Abnormal edema and enhancement in the distal phalanx small toe compatible with osteomyelitis. Edema in the medial sesamoid of the first digit with a transverse cleft in the medial sesamoid, possibilities include sesamoid fracture versus sesamoiditis in the setting of incidental bifid sesamoid. Mild degenerative arthropathy along the proximal lateral portion of the medial cuneiform. Ligaments Lisfranc ligament intact. Muscles and Tendons Fusiform thickening of the distal medial band of the plantar fascia on image 10 series 7 compatible with plantar fibromatosis. Soft tissues Accentuated cutaneous enhancement in the small toe potentially from cellulitis. IMPRESSION: 1. Osteomyelitis of the distal phalanx small toe. 2. Edema in the medial sesamoid first digit with a transverse cleft in the medial sesamoid, possibilities include sesamoid fracture versus sesamoiditis in the setting of incidental bifid sesamoid. 3. 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 4. Accentuated cutaneous enhancement in the small toe potentially from cellulitis. 5. Mild degenerative arthropathy along the proximal lateral portion of the medial cuneiform. Electronically Signed   By: Freida Jes M.D.   On: 12/30/2023 14:08   DG Foot 2 Views Left Result Date: 12/29/2023 CLINICAL DATA:  pinky toe osteo? EXAM: LEFT FOOT - 2 VIEW COMPARISON:  None Available. FINDINGS: No acute fracture or dislocation. No aggressive osseous lesion. Mild diffuse  arthritis of imaged joints. Calcaneal spur noted along the Achilles tendon and Plantar aponeurosis attachment sites. There is subtle ossific density along the medial aspect of the proximal portion of the distal phalanx of fifth toe. However, no discrete adjacent cortical defect noted. There is diffuse soft tissue swelling of the fifth toe. No discrete soft tissue defect or air within the soft tissue. Findings are equivocal for acute osteomyelitis. Correlate clinically to determine the need for additional imaging with more sensitive modality of MRI. No radiopaque foreign bodies. IMPRESSION: 1. No acute osseous abnormality of the left foot. 2. There is subtle ossific density along the medial aspect of the proximal portion of the distal phalanx of fifth toe. However, no discrete adjacent cortical defect noted. There is diffuse soft tissue swelling of the fifth toe. No discrete soft tissue defect or air within the soft tissue. Findings are equivocal for acute osteomyelitis. Electronically Signed   By: Beula Brunswick M.D.   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 Also has secondary infection with EPEC, possibly from poor living conditions/food handling.  For osteomyelitis = await to see if vascular will be able to do revascularization procedure prior to amputation.  We will keep on piptazo and change vancomycin  to daptomycin.  Will check sed rate and crp.  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 Check bmp and magnesium  tomorrow to see if needs any repletion due to GI loss.  T2DM  = need to restart regimen to help with better glucose control   Social work = may need assistance to help with getting utilities back on for his home living condition, may need snf in the short-term recovery.  Keep on enteric precautions while he has diarrhea  Dymin Dingledine B. Levern Reader MD MPH [facility] [phone]

## 2023-12-31 NOTE — Progress Notes (Signed)
 ABI's have been completed. Preliminary results can be found in CV Proc through chart review.   12/31/23 11:25 AM Birda Buffy RVT

## 2024-01-01 ENCOUNTER — Encounter (HOSPITAL_COMMUNITY): Payer: Self-pay | Admitting: Vascular Surgery

## 2024-01-01 ENCOUNTER — Encounter (HOSPITAL_COMMUNITY)
Admission: EM | Disposition: A | Discharge status: Home / Self Care | Payer: Self-pay | Source: Home / Self Care | Attending: Internal Medicine

## 2024-01-01 DIAGNOSIS — R197 Diarrhea, unspecified: Secondary | ICD-10-CM

## 2024-01-01 DIAGNOSIS — L0390 Cellulitis, unspecified: Secondary | ICD-10-CM | POA: Diagnosis not present

## 2024-01-01 DIAGNOSIS — A040 Enteropathogenic Escherichia coli infection: Secondary | ICD-10-CM | POA: Diagnosis not present

## 2024-01-01 DIAGNOSIS — L97529 Non-pressure chronic ulcer of other part of left foot with unspecified severity: Secondary | ICD-10-CM

## 2024-01-01 DIAGNOSIS — M86172 Other acute osteomyelitis, left ankle and foot: Secondary | ICD-10-CM | POA: Diagnosis not present

## 2024-01-01 DIAGNOSIS — A419 Sepsis, unspecified organism: Secondary | ICD-10-CM | POA: Diagnosis not present

## 2024-01-01 DIAGNOSIS — M869 Osteomyelitis, unspecified: Secondary | ICD-10-CM | POA: Diagnosis not present

## 2024-01-01 DIAGNOSIS — I70201 Unspecified atherosclerosis of native arteries of extremities, right leg: Secondary | ICD-10-CM

## 2024-01-01 DIAGNOSIS — I10 Essential (primary) hypertension: Secondary | ICD-10-CM | POA: Diagnosis not present

## 2024-01-01 HISTORY — PX: LOWER EXTREMITY ANGIOGRAPHY: CATH118251

## 2024-01-01 HISTORY — PX: LOWER EXTREMITY INTERVENTION: CATH118252

## 2024-01-01 LAB — GLUCOSE, CAPILLARY
Glucose-Capillary: 267 mg/dL — ABNORMAL HIGH (ref 70–99)
Glucose-Capillary: 143 mg/dL — ABNORMAL HIGH (ref 70–99)
Glucose-Capillary: 117 mg/dL — ABNORMAL HIGH (ref 70–99)
Glucose-Capillary: 297 mg/dL — ABNORMAL HIGH (ref 70–99)

## 2024-01-01 LAB — CBC WITH DIFFERENTIAL/PLATELET
Abs Immature Granulocytes: 0.05 10*3/uL (ref 0.00–0.07)
Basophils Absolute: 0.1 10*3/uL (ref 0.0–0.1)
Basophils Relative: 1 %
Eosinophils Absolute: 0.3 10*3/uL (ref 0.0–0.5)
Eosinophils Relative: 4 %
HCT: 37.9 % — ABNORMAL LOW (ref 39.0–52.0)
Hemoglobin: 13.9 g/dL (ref 13.0–17.0)
Immature Granulocytes: 1 %
Lymphocytes Relative: 28 %
Lymphs Abs: 2.6 10*3/uL (ref 0.7–4.0)
MCH: 33.9 pg (ref 26.0–34.0)
MCHC: 36.7 g/dL — ABNORMAL HIGH (ref 30.0–36.0)
MCV: 92.4 fL (ref 80.0–100.0)
Monocytes Absolute: 0.8 10*3/uL (ref 0.1–1.0)
Monocytes Relative: 8 %
Neutro Abs: 5.6 10*3/uL (ref 1.7–7.7)
Neutrophils Relative %: 58 %
Platelets: 231 10*3/uL (ref 150–400)
RBC: 4.1 MIL/uL — ABNORMAL LOW (ref 4.22–5.81)
RDW: 13 % (ref 11.5–15.5)
WBC: 9.4 10*3/uL (ref 4.0–10.5)
nRBC: 0 % (ref 0.0–0.2)

## 2024-01-01 LAB — BASIC METABOLIC PANEL WITH GFR
Anion gap: 8 (ref 5–15)
BUN: 10 mg/dL (ref 8–23)
CO2: 27 mmol/L (ref 22–32)
Calcium: 9.1 mg/dL (ref 8.9–10.3)
Chloride: 104 mmol/L (ref 98–111)
Creatinine, Ser: 1.05 mg/dL (ref 0.61–1.24)
GFR, Estimated: >60 mL/min (ref >60)
Glucose, Bld: 91 mg/dL (ref 70–99)
Potassium: 3.7 mmol/L (ref 3.5–5.1)
Sodium: 139 mmol/L (ref 135–145)

## 2024-01-01 LAB — MAGNESIUM: Magnesium: 1.6 mg/dL — ABNORMAL LOW (ref 1.7–2.4)

## 2024-01-01 LAB — VAS US ABI WITH/WO TBI
Left ABI: 0.89
Right ABI: 0.41

## 2024-01-01 SURGERY — LOWER EXTREMITY ANGIOGRAPHY
Anesthesia: LOCAL

## 2024-01-01 MED ORDER — HEPARIN (PORCINE) IN NACL 1000-0.9 UT/500ML-% IV SOLN
INTRAVENOUS | Status: DC | PRN
  Administered 2024-01-01 (×2): 500 mL

## 2024-01-01 MED ORDER — CLOPIDOGREL BISULFATE 300 MG PO TABS
ORAL_TABLET | ORAL | Status: DC | PRN
  Administered 2024-01-01: 300 mg via ORAL

## 2024-01-01 MED ORDER — CLOPIDOGREL BISULFATE 75 MG PO TABS
75.0000 mg | ORAL_TABLET | Freq: Every day | ORAL | Status: DC
  Administered 2024-01-02: 75 mg via ORAL
  Filled 2024-01-01: qty 1

## 2024-01-01 MED ORDER — CLOPIDOGREL BISULFATE 300 MG PO TABS
ORAL_TABLET | ORAL | Status: AC
  Filled 2024-01-01: qty 1

## 2024-01-01 MED ORDER — ROSUVASTATIN CALCIUM 5 MG PO TABS: 10.0000 mg | ORAL_TABLET | Freq: Every day | ORAL | Status: DC

## 2024-01-01 MED ORDER — SODIUM CHLORIDE 0.9 % IV SOLN: 250.0000 mL | INTRAVENOUS | Status: DC | PRN

## 2024-01-01 MED ORDER — SODIUM CHLORIDE 0.9% FLUSH
3.0000 mL | Freq: Two times a day (BID) | INTRAVENOUS | Status: DC
Start: 2024-01-01 — End: 2024-01-02
  Administered 2024-01-01 – 2024-01-02 (×2): 3 mL via INTRAVENOUS

## 2024-01-01 MED ORDER — HEPARIN SODIUM (PORCINE) 1000 UNIT/ML IJ SOLN
INTRAMUSCULAR | Status: DC | PRN
  Administered 2024-01-01: 5000 [IU] via INTRAVENOUS

## 2024-01-01 MED ORDER — CLOPIDOGREL BISULFATE 75 MG PO TABS: 300.0000 mg | ORAL_TABLET | Freq: Once | ORAL | Status: AC

## 2024-01-01 MED ORDER — HEPARIN SODIUM (PORCINE) 1000 UNIT/ML IJ SOLN
INTRAMUSCULAR | Status: AC
Start: 2024-01-01 — End: ?
  Filled 2024-01-01: qty 10

## 2024-01-01 MED ORDER — FENTANYL CITRATE (PF) 100 MCG/2ML IJ SOLN
INTRAMUSCULAR | Status: DC | PRN
  Administered 2024-01-01: 50 ug via INTRAVENOUS

## 2024-01-01 MED ORDER — MIDAZOLAM HCL 2 MG/2ML IJ SOLN
INTRAMUSCULAR | Status: DC | PRN
  Administered 2024-01-01: 1 mg via INTRAVENOUS

## 2024-01-01 MED ORDER — LIDOCAINE HCL (PF) 1 % IJ SOLN
INTRAMUSCULAR | Status: AC
  Filled 2024-01-01: qty 30

## 2024-01-01 MED ORDER — SODIUM CHLORIDE 0.9 % IV SOLN
3.0000 g | Freq: Four times a day (QID) | INTRAVENOUS | Status: DC
  Administered 2024-01-01 – 2024-01-02 (×4): 3 g via INTRAVENOUS
  Filled 2024-01-01 (×6): qty 8

## 2024-01-01 MED ORDER — MIDAZOLAM HCL 2 MG/2ML IJ SOLN
INTRAMUSCULAR | Status: AC
  Filled 2024-01-01: qty 2

## 2024-01-01 MED ORDER — VANCOMYCIN HCL 1.25 G IV SOLR
1250.0000 mg | INTRAVENOUS | Status: DC
  Administered 2024-01-02: 1250 mg via INTRAVENOUS
  Filled 2024-01-01 (×2): qty 25

## 2024-01-01 MED ORDER — IODIXANOL 320 MG/ML IV SOLN
INTRAVENOUS | Status: DC | PRN
  Administered 2024-01-01: 90 mL

## 2024-01-01 MED ORDER — SODIUM CHLORIDE 0.9% FLUSH: 3.0000 mL | INTRAVENOUS | Status: DC | PRN

## 2024-01-01 MED ORDER — ASPIRIN 81 MG PO CHEW
CHEWABLE_TABLET | ORAL | Status: AC
  Filled 2024-01-01: qty 1

## 2024-01-01 MED ORDER — SODIUM CHLORIDE 0.9 % IV SOLN: INTRAVENOUS | Status: AC

## 2024-01-01 MED ORDER — HEPARIN SODIUM (PORCINE) 5000 UNIT/ML IJ SOLN: 5000.0000 [IU] | Freq: Three times a day (TID) | INTRAMUSCULAR | Status: DC

## 2024-01-01 MED ORDER — FENTANYL CITRATE (PF) 100 MCG/2ML IJ SOLN
INTRAMUSCULAR | Status: AC
  Filled 2024-01-01: qty 2

## 2024-01-01 MED ORDER — ASPIRIN 81 MG PO CHEW
CHEWABLE_TABLET | ORAL | Status: DC | PRN
  Administered 2024-01-01: 81 mg via ORAL

## 2024-01-01 MED ORDER — LIDOCAINE HCL (PF) 1 % IJ SOLN
INTRAMUSCULAR | Status: DC | PRN
  Administered 2024-01-01: 10 mL

## 2024-01-01 MED ORDER — ACETAMINOPHEN 325 MG PO TABS: 650.0000 mg | ORAL_TABLET | ORAL | Status: DC | PRN

## 2024-01-01 MED ORDER — HYDRALAZINE HCL 20 MG/ML IJ SOLN: 5.0000 mg | INTRAMUSCULAR | Status: DC | PRN

## 2024-01-01 MED ORDER — MAGNESIUM SULFATE 4 GM/100ML IV SOLN
4.0000 g | Freq: Once | INTRAVENOUS | Status: AC
  Administered 2024-01-01: 4 g via INTRAVENOUS
  Filled 2024-01-01: qty 100

## 2024-01-01 SURGICAL SUPPLY — 17 items
BALLOON MUSTANG 5X100X135 (BALLOONS) IMPLANT
CATH OMNI FLUSH 5F 65CM (CATHETERS) IMPLANT
CLOSURE MYNX CONTROL 6F/7F (Vascular Products) IMPLANT
COVER DOME SNAP 22 D (MISCELLANEOUS) IMPLANT
GLIDEWIRE ADV .035X260CM (WIRE) IMPLANT
KIT MICROPUNCTURE NIT STIFF (SHEATH) IMPLANT
KIT PV (KITS) ×2 IMPLANT
SET ATX-X65L (MISCELLANEOUS) IMPLANT
SHEATH CATAPULT 6FR 45 (SHEATH) IMPLANT
SHEATH PINNACLE 5F 10CM (SHEATH) IMPLANT
SHEATH PINNACLE 6F 10CM (SHEATH) IMPLANT
SHEATH PROBE COVER 6X72 (BAG) IMPLANT
STENT ELUVIA 6X100X130 (Permanent Stent) IMPLANT
SYR MEDRAD MARK 7 150ML (SYRINGE) ×2 IMPLANT
TRANSDUCER W/STOPCOCK (MISCELLANEOUS) ×2 IMPLANT
TRAY PV CATH (CUSTOM PROCEDURE TRAY) ×2 IMPLANT
WIRE BENTSON .035X145CM (WIRE) IMPLANT

## 2024-01-01 NOTE — Progress Notes (Signed)
   PODIATRY PROGRESS NOTE Patient Name: [name]  DOB [birth year] DOA 12/29/2023  Hospital Day: 4  Assessment:  68 y.o. male with PMHx significant for  hypertension, hyperlipidemia, CAD, uncontrolled insulin -dependent type 2 diabetes who presented with hyperglycemia and sepsis 2/2 ulceration and osteomyelitis of the L 5th toe.    WBC 9.4 A1c 14.3 MRI L foot: Osteomyelitis of the distal phalanx L 5th toe   ABI: 0.89 L, absent waveform at great toe. R ABI 0.41  Plan:  - NPO past MN for OR tomorrow AM for L 5th toe amputation. He is in agreement.  - Appreciate vascular, plan for BLE angiogram today - Abx per ID - Anticoagulation: ok to continue per primary - Wound care: None required pre op - WB status: WBAT in post op shoe following OR - Will continue to follow        Maridee Shoemaker, DPM [facility]    Subjective:  PT seen bedside, discussed plans for OR tomorrow and angiogram today. He wants his diet changed to regular says he does not carb restricted diet. He again told me about his financial struggles.   Objective:   Vitals:   01/01/24 0424 01/01/24 0759  BP: 135/62 (!) 115/97  Pulse: 65 60  Resp: 18   Temp: 97.9 F (36.6 C) 98 F (36.7 C)  SpO2: 99% 100%       Latest Ref Rng & Units 01/01/2024    8:14 AM 12/31/2023    3:12 AM 12/30/2023    3:11 AM  CBC  WBC 4.0 - 10.5 K/uL 9.4  8.8  14.4   Hemoglobin 13.0 - 17.0 g/dL 16.1  09.6  04.5   Hematocrit 39.0 - 52.0 % 37.9  41.9  43.6   Platelets 150 - 400 K/uL 231  198  212        Latest Ref Rng & Units 01/01/2024    8:14 AM 01/01/2024    4:05 AM 12/31/2023    3:12 AM  BMP  Glucose 70 - 99 mg/dL 91  CORRECTED RESULTS CALLED TO:  C 263   BUN 8 - 23 mg/dL 10  CORRECTED RESULTS CALLED TO:  C 15   Creatinine 0.61 - 1.24 mg/dL 4.09  CORRECTED RESULTS CALLED TO:  C 0.93   Sodium 135 - 145 mmol/L 139  CORRECTED RESULTS CALLED TO:  C 134   Potassium 3.5 - 5.1 mmol/L 3.7  CORRECTED RESULTS CALLED TO:   C 4.1   Chloride 98 - 111 mmol/L 104  CORRECTED RESULTS CALLED TO:  C 104   CO2 22 - 32 mmol/L 27  CORRECTED RESULTS CALLED TO:  C 22   Calcium  8.9 - 10.3 mg/dL 9.1  CORRECTED RESULTS CALLED TO:  C 8.5     C Corrected result    General: AAOx3, NAD  Lower Extremity Exam Left foot weakly palpable DP and PT pulses Cap fill intact to L 5th toe and normal warmth   Sensation diminished to toes   Necrotic tissue and ucleration present at the distal medial aspect of 5th toe with drainage and malodor   No gross deformity, mild to moderate edmea        Radiology:  Results reviewed. See assessment for pertinent imaging results

## 2024-01-01 NOTE — Progress Notes (Addendum)
  Progress Note    01/01/2024 9:12 AM * No surgery found *  Subjective:  no complaints    Vitals:   01/01/24 0424 01/01/24 0759  BP: 135/62 (!) 115/97  Pulse: 65 60  Resp: 18   Temp: 97.9 F (36.6 C) 98 F (36.7 C)  SpO2: 99% 100%    Physical Exam: General:  resting comfortably Lungs:  nonlabored Extremities:  nonpalpable left pedal pulses, Left 5th toe is dry   CBC    Component Value Date/Time   WBC 8.8 12/31/2023 0312   RBC 4.36 12/31/2023 0312   HGB 14.6 12/31/2023 0312   HCT 41.9 12/31/2023 0312   PLT 198 12/31/2023 0312   MCV 96.1 12/31/2023 0312   MCH 33.5 12/31/2023 0312   MCHC 34.8 12/31/2023 0312   RDW 13.0 12/31/2023 0312   LYMPHSABS 2.9 12/31/2023 0312   MONOABS 0.7 12/31/2023 0312   EOSABS 0.3 12/31/2023 0312   BASOSABS 0.1 12/31/2023 0312    BMET    Component Value Date/Time   NA CORRECTED RESULTS CALLED TO: 01/01/2024 0405   NA 138 06/16/2019 1144   K CORRECTED RESULTS CALLED TO: 01/01/2024 0405   CL CORRECTED RESULTS CALLED TO: 01/01/2024 0405   CO2 CORRECTED RESULTS CALLED TO: 01/01/2024 0405   GLUCOSE CORRECTED RESULTS CALLED TO: 01/01/2024 0405   BUN CORRECTED RESULTS CALLED TO: 01/01/2024 0405   BUN 15 06/16/2019 1144   CREATININE CORRECTED RESULTS CALLED TO: 01/01/2024 0405   CALCIUM  CORRECTED RESULTS CALLED TO: 01/01/2024 0405   GFRNONAA CORRECTED RESULTS CALLED TO: 01/01/2024 0405   GFRAA >60 02/29/2020 0937    INR    Component Value Date/Time   INR 1.1 06/04/2019 2318     Intake/Output Summary (Last 24 hours) at 01/01/2024 0912 Last data filed at 01/01/2024 0200 Gross per 24 hour  Intake 147.41 ml  Output 700 ml  Net -552.59 ml      Assessment/Plan:  68 y.o. male admitted for left 5th toe wound   -We were consulted yesterday for a left 5th toe wound with osteomyelitis with underlying PAD -He has nonpalpable pedal pulses and a dry left 5th toe wound -ABIs demonstrated absent toe pressures bilaterally, not  sufficient for wound healing -Plan is for LLE angiogram today in the cath lab with Dr.Robins. He has been NPO past midnight. He remains agreeable   Deneise Finlay, [facility] [phone] 01/01/2024 9:12 AM    I have independently examined the patient and agree with PA assessment and plan above. Plan for angio from right common femoral approach today with possible intervention.   Meryle Pugmire C. Vikki Graves, MD [facility] [place] Office: [phone] Pager: [phone]
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
## 2024-01-01 NOTE — Progress Notes (Signed)
 Patient arrived at the unit from Cath lab,Upon arrival pt is alert and oriented X4,CHG bath given,CCMD notified,vitals taken,bilateral dorsalis pedis pulse dopplered,rt groin incision level 0,pt oriented to the unit,Call bell in reach

## 2024-01-01 NOTE — Op Note (Signed)
 Patient name: [name] MRN: [id number] DOB: 02-Oct-1955 Sex: male  01/01/2024 Pre-operative Diagnosis: Atherosclerosis native arteries left lower extremity with small toe ulceration and osteomyelitis Post-operative diagnosis:  Same Surgeon:  Sarajane Cumming. Vikki Graves, MD Procedure Performed: 1.  Percutaneous ultrasound-guided access of Mynx device closure right common femoral artery 2.  Catheter in aorta and aortogram with bilateral lower extremity angiography 3.  Catheter selection left SFA and stent of left SFA with 6 x 100 mm Eluvia postdilated with 5 mm balloon 4.  Moderate sedation with fentanyl  and Versed  for 30 minutes  Indications: 68 year old male with history of known bilateral superficial femoral artery disease now with osteomyelitis of the left small toe.  He is indicated for angiography with possible intervention.  Findings: The aorta was free of flow-limiting stenosis in the repaired renal arteries bilaterally.  The left common iliac artery is calcified however there is no flow-limiting stenosis and bilateral hypogastric arteries are patent as are the bilateral common iliac and external iliac arteries.  The right common femoral artery is patent and there is a flush occlusion of the right SFA and he reconstitutes above the knee popliteal artery with dominant two-vessel runoff via the anterior tibial and posterior tibial artery and there is also more diminutive peroneal runoff.  On the left side which is the site of interest there appears to be stenosis of the takeoff of the left SFA but there is only a 5 mmHg gradient across this area of concern.  The mid SFA has approximately 70% stenosis with disease proximally and distally of this and this was stented to 0% residual stenosis.  There was brisk runoff via the anterior tibial and posterior tibial arteries which mirrors the right side and there is also a diminutive peroneal.  At completion there is a 1+ palpable anterior tibial and posterior  tibial artery at the ankle.  Patient is optimized from a vascular standpoint for left fifth toe amputation.   Procedure:  The patient was identified in the holding area and taken to room 8.  The patient was then placed supine on the table and prepped and draped in the usual sterile fashion.  A time out was called.  Ultrasound was used to evaluate the right common femoral artery.  There was noted to be some posterior plaque with the anterior artery was patent and free of disease and this was cannulated with micropuncture needle followed by wire sheath using direct ultrasound visualization and an image was saved the permanent record.  Concomitantly we administered fentanyl  and Versed  as moderate sedation his vital signs were monitored throughout the case.  We placed a Bentson wire followed by 5 [place] sheath and Omni catheter to the level of L1 and aortogram was performed followed by pelvic angiography with RAO angulation followed by crossing the bifurcation and placing the catheter in the left common femoral artery and performing left lower extremity angiography.  With the above findings we placed a Glidewire advantage into the left SFA and a 6 French sheath was placed in the left SFA as well across the area of concern stenosis in the proximal SFA.  Patient was fully heparinized at this time.  We then crossed the mid SFA stenosis with Glidewire advantage confirmed intraluminal access and and primarily stented with a 6 x 100 mm drug-eluting stent followed by postdilatation with 5 mm balloon.  Completion demonstrated no residual stenosis.  We then did a pullback gradient across the proximal SFA and there was only a 5  mmHg gradient which did not give rise for concern and no intervention was undertaken.  We then exchanged for a short 6 French sheath in the right groin and perform right lower extremity angiography and then deployed a minx device.  The patient tolerated the procedure without any  complication.   Contrast: 90 cc  Roshawnda Pecora C. Vikki Graves, MD [facility] Office: [phone] Pager: [phone]

## 2024-01-01 NOTE — Progress Notes (Signed)
 Pharmacy Antibiotic Note  [name] is a 68 y.o. male admitted on 12/29/2023 with sepsis due to left toe osteomyelitis.  Pharmacy has been consulted for Vancomycin  dosing. Pt also on Unasyn.  Plan for L 5th toe amp 5/24. ID is following - continue Vanc/Unasyn until surgery.  SCr up to 1.05.  Plan: Change Vancomycin  to 1250 mg IV q12h  (SCr 1.05, TBW<IBW, est AUC 488) Measure Vanc levels as needed.  Goal AUC = 400 - 550 Follow up renal function, culture results, and clinical course.   Height: 5\' 8"  (172.7 cm) Weight: 63.9 kg (140 lb 14 oz) IBW/kg (Calculated) : 68.4  Temp (24hrs), Avg:98 F (36.7 C), Min:97.8 F (36.6 C), Max:98.2 F (36.8 C)  Recent Labs  Lab 12/29/23 1839 12/29/23 2220 12/30/23 0311 12/31/23 0312 01/01/24 0405 01/01/24 0814  WBC 15.0*  --  14.4* 8.8  --  9.4  CREATININE 1.21 1.02 0.87 0.93 CORRECTED RESULTS CALLED TO: 1.05  LATICACIDVEN  --  4.0* 1.7  --   --   --     Estimated Creatinine Clearance: 61.7 mL/min (by C-G formula based on SCr of 1.05 mg/dL).    Allergies  Allergen Reactions   Cefepime  Hives    Had allergic reaction to one of these 3 agents, unclear which one   Metoprolol  Hives    Had allergic reaction to one of these 3 agents, unclear which one  *Per RN, highly likely this is the cause of allergic rxn   Myrbetriq  [Mirabegron ] Hives    Had allergic reaction to one of these 3 agents, unclear which one    Antimicrobials this admission: 5/20 Zosyn  >> 5/23 5/23  Unasyn>  5/20 Vancomycin   >>     Microbiology results: 5/20 GI panel: EPEC, cdiff neg 5/20 BCx:  ngtd 5/20 UCx: neg 5/21 MRSA PCR: negative  Thank you for allowing pharmacy to be a part of this patient's care.  Kendall Pauls PharmD, BCPS [facility] main pharmacy [phone] 01/01/2024 1:10 PM

## 2024-01-01 NOTE — Care Management Important Message (Signed)
 Important Message  Patient Details  Name: [name] MRN: [id number] Date of Birth: [birth year]   Important Message Given:  Yes - Medicare IM     Felix Host 01/01/2024, 1:02 PM

## 2024-01-01 NOTE — Progress Notes (Signed)
 [facility]  Date of Admission:  12/29/2023      Abx: 5/23-c amp-sulb 5/20-c vanc  5/20-23 piptazo  ASSESSMENT: 68 y.o. male with PMHx significant for  hypertension, hyperlipidemia, CAD, uncontrolled insulin -dependent type 2 diabetes who presented with hyperglycemia and sepsis 2/2 ulceration and osteomyelitis of the L 5th toe.    WBC 8.8 (14.4) A1c 14.3 MRI L foot: Osteomyelitis of the distal phalanx L 5th toe   ABI: 0.89 L, absent waveform at great toe. R ABI 0.41  Diarrhea -- epec on gi pcr; on supportive care  Podiatry evaluated; pending vascular evaluation for vascular disease    PLAN: Await vascular input Continue empiric bsAbx for now Maintain contact isolation precaution in setting gi symptoms Discussed with primary team  Principal Problem:   Sepsis due to cellulitis [facility]) Active Problems:   Essential hypertension   Dyslipidemia   Diabetes (HCC)   Hypokalemia   Insulin  dependent type 2 diabetes mellitus (HCC)   Chronic diarrhea   Osteomyelitis of fifth toe of left foot (HCC)   Uncontrolled type 2 diabetes mellitus with hyperglycemia, with long-term current use of insulin  (HCC)   Enteritis, enteropathogenic E. coli   Acute osteomyelitis of toe, left (HCC)   Allergies  Allergen Reactions   Cefepime  Hives    Had allergic reaction to one of these 3 agents, unclear which one   Metoprolol  Hives    Had allergic reaction to one of these 3 agents, unclear which one  *Per RN, highly likely this is the cause of allergic rxn   Myrbetriq  [Mirabegron ] Hives    Had allergic reaction to one of these 3 agents, unclear which one    Scheduled Meds:  aspirin  EC  81 mg Oral Daily   atorvastatin   40 mg Oral Daily   calcium  carbonate  1 tablet Oral TID   Chlorhexidine  Gluconate Cloth  6 each Topical Daily   enoxaparin  (LOVENOX ) injection  40 mg Subcutaneous Q24H   feeding supplement  237 mL Oral BID BM   guaiFENesin  600 mg Oral  BID   insulin  aspart  0-15 Units Subcutaneous TID WC   insulin  aspart  0-5 Units Subcutaneous QHS   insulin  glargine-yfgn  10 Units Subcutaneous Daily   pantoprazole   40 mg Oral Daily   Continuous Infusions:  piperacillin -tazobactam (ZOSYN )  IV 3.375 g (01/01/24 0635)   vancomycin  750 mg (01/01/24 0050)   PRN Meds:.acetaminophen  **OR** acetaminophen , albuterol , dextrose , HYDROmorphone (DILAUDID) injection, hydrOXYzine, loratadine , menthol-cetylpyridinium, ondansetron  **OR** ondansetron  (ZOFRAN ) IV, mouth rinse, oxyCODONE , QUEtiapine   SUBJECTIVE: No complaint  Review of Systems: ROS All other ROS was negative, except mentioned above     OBJECTIVE: Vitals:   12/31/23 1822 12/31/23 2002 01/01/24 0424 01/01/24 0759  BP: 132/62 (!) 154/77 135/62 (!) 115/97  Pulse: (!) 102 68 65 60  Resp: 18 18 18    Temp: 97.8 F (36.6 C) 98.2 F (36.8 C) 97.9 F (36.6 C) 98 F (36.7 C)  TempSrc: Oral  Oral Oral  SpO2: 100% 100% 99% 100%  Weight:      Height:       Body mass index is 21.42 kg/m.  Physical Exam General/constitutional: no distress, pleasant HEENT: Normocephalic, PER, Conj Clear, EOMI, Oropharynx clear Neck supple CV: rrr no mrg Lungs: clear to auscultation, normal respiratory effort Abd: Soft, Nontender Ext: no edema Skin: No Rash Neuro: nonfocal MSK: left small toe wet ulcers; no frank cellulitis change; no purulence  Lab Results Lab Results  Component Value Date   WBC 8.8 12/31/2023   HGB 14.6 12/31/2023   HCT 41.9 12/31/2023   MCV 96.1 12/31/2023   PLT 198 12/31/2023    Lab Results  Component Value Date   CREATININE CORRECTED RESULTS CALLED TO: 01/01/2024   BUN CORRECTED RESULTS CALLED TO: 01/01/2024   NA CORRECTED RESULTS CALLED TO: 01/01/2024   K CORRECTED RESULTS CALLED TO: 01/01/2024   CL CORRECTED RESULTS CALLED TO: 01/01/2024   CO2 CORRECTED RESULTS CALLED TO: 01/01/2024    Lab Results  Component Value Date   ALT 28 01/13/2023   AST  21 01/13/2023   ALKPHOS 78 01/13/2023   BILITOT 0.7 01/13/2023      Microbiology: Recent Results (from the past 240 hours)  Urine Culture (for pregnant, neutropenic or urologic patients or patients with an indwelling urinary catheter)     Status: None   Collection Time: 12/29/23  6:35 PM   Specimen: Urine, Clean Catch  Result Value Ref Range Status   Specimen Description   Final    URINE, CLEAN CATCH Performed at [facility], [address] [place], [place] [address]    Special Requests   Final    NONE Performed at [facility], [address], [place], [place] [address]    Culture   Final    NO GROWTH Performed at [facility], [address], [place], [place] [address]    Report Status 12/31/2023 FINAL  Final  Culture, blood (routine x 2)     Status: None (Preliminary result)   Collection Time: 12/29/23  7:31 PM   Specimen: BLOOD RIGHT ARM  Result Value Ref Range Status   Specimen Description   Final    BLOOD RIGHT ARM Performed at [facility], [address] [place], [place] [address]    Special Requests   Final    BOTTLES DRAWN AEROBIC AND ANAEROBIC Blood Culture adequate volume Performed at [facility], [address] [place], [place] [address]    Culture   Final    NO GROWTH 3 DAYS Performed at [facility], [address] [place], [place] [address]    Report Status PENDING  Incomplete  Culture, blood (routine x 2)     Status: None (Preliminary result)   Collection Time: 12/29/23  7:31 PM   Specimen: BLOOD LEFT ARM  Result Value Ref Range Status   Specimen Description   Final    BLOOD LEFT ARM Performed at [facility], [address] [place], [place] [address]    Special Requests   Final    BOTTLES DRAWN AEROBIC AND ANAEROBIC Blood Culture results may not be optimal due to an inadequate volume of blood received in culture bottles Performed at [facility], [address] [place], [place] [address]    Culture   Final    NO GROWTH 3 DAYS Performed at [facility], [address] [place], [place] [address]    Report Status PENDING  Incomplete  MRSA Next Gen by PCR, Nasal     Status: None   Collection Time: 12/30/23 12:10 AM   Specimen: Nasal Mucosa; Nasal Swab  Result Value Ref Range Status   MRSA by PCR Next Gen NOT DETECTED NOT DETECTED Final    Comment: (NOTE) The GeneXpert MRSA Assay (FDA approved for NASAL specimens only), is one component of a comprehensive MRSA colonization surveillance  program. It is not intended to diagnose MRSA infection nor to guide or monitor treatment for MRSA infections. Test performance is not FDA approved in patients less than 14 years old. Performed at [facility], [address], [place], [place] [address]   C Difficile Quick Screen w PCR reflex     Status: None   Collection Time: 12/30/23 10:00 AM   Specimen: STOOL  Result Value Ref Range Status   C Diff antigen NEGATIVE NEGATIVE Final   C Diff toxin NEGATIVE NEGATIVE Final   C Diff interpretation No C. difficile detected.  Final    Comment: Performed at [facility], [address] [place], [place] [address]  Gastrointestinal Panel by PCR , Stool     Status: Abnormal   Collection Time: 12/30/23 10:00 AM   Specimen: STOOL  Result Value Ref Range Status   Campylobacter species NOT DETECTED NOT DETECTED Final   Plesimonas shigelloides NOT DETECTED NOT DETECTED Final   Salmonella species NOT DETECTED NOT DETECTED Final   Yersinia enterocolitica NOT DETECTED NOT DETECTED Final   Vibrio species NOT DETECTED NOT DETECTED Final   Vibrio cholerae NOT DETECTED NOT DETECTED Final   Enteroaggregative E coli (EAEC) NOT DETECTED NOT DETECTED Final   Enteropathogenic E coli (EPEC) DETECTED (A) NOT DETECTED Final    Comment: RESULT CALLED TO, READ BACK BY AND VERIFIED WITH: Jennie Moeller, RN 12/31/23  0931 MW    Enterotoxigenic E coli (ETEC) NOT DETECTED NOT DETECTED Final   Shiga like toxin producing E coli (STEC) NOT DETECTED NOT DETECTED Final   Shigella/Enteroinvasive E coli (EIEC) NOT DETECTED NOT DETECTED Final   Cryptosporidium NOT DETECTED NOT DETECTED Final   Cyclospora cayetanensis NOT DETECTED NOT DETECTED Final   Entamoeba histolytica NOT DETECTED NOT DETECTED Final   Giardia lamblia NOT DETECTED NOT DETECTED Final   Adenovirus F40/41 NOT DETECTED NOT DETECTED Final   Astrovirus NOT DETECTED NOT DETECTED Final   Norovirus GI/GII NOT DETECTED NOT DETECTED Final   Rotavirus A NOT DETECTED NOT DETECTED Final   Sapovirus (I, II, IV, and V) NOT DETECTED NOT DETECTED Final    Comment: Performed at [facility], [address], [place], [place] [address]     Serology:   Imaging: If present, new imagings (plain films, ct scans, and mri) have been personally visualized and interpreted; radiology reports have been reviewed. Decision making incorporated into the Impression / Recommendations.  5/21 mri left foot wwo contrast 1. Osteomyelitis of the distal phalanx small toe. 2. Edema in the medial sesamoid first digit with a transverse cleft in the medial sesamoid, possibilities include sesamoid fracture versus sesamoiditis in the setting of incidental bifid sesamoid. 3. 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 4. Accentuated cutaneous enhancement in the small toe potentially from cellulitis. 5. Mild degenerative arthropathy along the proximal lateral portion of the medial cuneiform.  Jamesetta Mcbride, MD [facility] [phone] pager    01/01/2024, 9:38 AM

## 2024-01-01 NOTE — Inpatient Diabetes Management (Signed)
 Inpatient Diabetes Program Recommendations  AACE/ADA: New Consensus Statement on Inpatient Glycemic Control (2015)  Target Ranges:  Prepandial:   less than 140 mg/dL      Peak postprandial:   less than 180 mg/dL (1-2 hours)      Critically ill patients:  140 - 180 mg/dL   Lab Results  Component Value Date   GLUCAP 267 (H) 01/01/2024   HGBA1C 14.3 (H) 12/31/2023    Review of Glycemic Control  Latest Reference Range & Units 12/31/23 07:38 12/31/23 11:53 12/31/23 16:47 12/31/23 22:35 01/01/24 05:54  Glucose-Capillary 70 - 99 mg/dL 409 (H) 811 (H) 914 (H) 343 (H) 267 (H)  (H): Data is abnormally high  Diabetes history: DM2 Outpatient Diabetes medications: Toujeo  100 units in am Current orders for Inpatient glycemic control: Semglee  10 units Q24H, Novolog  0-15 TID with meals and 0-5 HS  Inpatient Diabetes Program Recommendations:    Might consider:  Semglee  15 units QAM  Thank you, Hays Lipschutz, MSN, CDCES Diabetes Coordinator Inpatient Diabetes Program [phone] (team pager from 8a-5p)

## 2024-01-02 ENCOUNTER — Inpatient Hospital Stay (HOSPITAL_COMMUNITY)

## 2024-01-02 ENCOUNTER — Encounter (HOSPITAL_COMMUNITY): Payer: Self-pay | Admitting: Internal Medicine

## 2024-01-02 ENCOUNTER — Encounter (HOSPITAL_COMMUNITY)
Admission: EM | Disposition: A | Discharge status: Home / Self Care | Payer: Self-pay | Source: Home / Self Care | Attending: Internal Medicine

## 2024-01-02 DIAGNOSIS — J45909 Unspecified asthma, uncomplicated: Secondary | ICD-10-CM | POA: Diagnosis not present

## 2024-01-02 DIAGNOSIS — A419 Sepsis, unspecified organism: Secondary | ICD-10-CM | POA: Diagnosis not present

## 2024-01-02 DIAGNOSIS — I10 Essential (primary) hypertension: Secondary | ICD-10-CM

## 2024-01-02 DIAGNOSIS — L0390 Cellulitis, unspecified: Secondary | ICD-10-CM | POA: Diagnosis not present

## 2024-01-02 DIAGNOSIS — E1169 Type 2 diabetes mellitus with other specified complication: Secondary | ICD-10-CM

## 2024-01-02 DIAGNOSIS — M869 Osteomyelitis, unspecified: Secondary | ICD-10-CM

## 2024-01-02 HISTORY — PX: AMPUTATION: SHX166

## 2024-01-02 LAB — COMPREHENSIVE METABOLIC PANEL WITH GFR
ALT: 14 U/L (ref 0–44)
AST: 23 U/L (ref 15–41)
Albumin: 3 g/dL — ABNORMAL LOW (ref 3.5–5.0)
Alkaline Phosphatase: 78 U/L (ref 38–126)
Anion gap: 9 (ref 5–15)
BUN: 11 mg/dL (ref 8–23)
CO2: 28 mmol/L (ref 22–32)
Calcium: 9.1 mg/dL (ref 8.9–10.3)
Chloride: 97 mmol/L — ABNORMAL LOW (ref 98–111)
Creatinine, Ser: 0.99 mg/dL (ref 0.61–1.24)
GFR, Estimated: >60 mL/min (ref >60)
Glucose, Bld: 270 mg/dL — ABNORMAL HIGH (ref 70–99)
Potassium: 4.2 mmol/L (ref 3.5–5.1)
Sodium: 134 mmol/L — ABNORMAL LOW (ref 135–145)
Total Bilirubin: 0.8 mg/dL (ref 0.0–1.2)
Total Protein: 6.2 g/dL — ABNORMAL LOW (ref 6.5–8.1)

## 2024-01-02 LAB — CBC
HCT: 42.4 % (ref 39.0–52.0)
Hemoglobin: 15.1 g/dL (ref 13.0–17.0)
MCH: 33.3 pg (ref 26.0–34.0)
MCHC: 35.6 g/dL (ref 30.0–36.0)
MCV: 93.6 fL (ref 80.0–100.0)
Platelets: 260 10*3/uL (ref 150–400)
RBC: 4.53 MIL/uL (ref 4.22–5.81)
RDW: 12.8 % (ref 11.5–15.5)
WBC: 10.7 10*3/uL — ABNORMAL HIGH (ref 4.0–10.5)
nRBC: 0 % (ref 0.0–0.2)

## 2024-01-02 LAB — GLUCOSE, CAPILLARY
Glucose-Capillary: 262 mg/dL — ABNORMAL HIGH (ref 70–99)
Glucose-Capillary: 263 mg/dL — ABNORMAL HIGH (ref 70–99)
Glucose-Capillary: 212 mg/dL — ABNORMAL HIGH (ref 70–99)

## 2024-01-02 LAB — MAGNESIUM: Magnesium: 2 mg/dL (ref 1.7–2.4)

## 2024-01-02 SURGERY — AMPUTATION, FOOT, RAY
Anesthesia: Monitor Anesthesia Care | Site: Fifth Toe | Laterality: Left

## 2024-01-02 MED ORDER — SODIUM CHLORIDE 0.9 % IR SOLN
Status: DC | PRN
  Administered 2024-01-02: 1000 mL

## 2024-01-02 MED ORDER — AMOXICILLIN-POT CLAVULANATE 875-125 MG PO TABS: 1.0000 | ORAL_TABLET | Freq: Two times a day (BID) | ORAL | 0 refills | Status: AC

## 2024-01-02 MED ORDER — LACTATED RINGERS IV SOLN: INTRAVENOUS | Status: DC

## 2024-01-02 MED ORDER — CHLORHEXIDINE GLUCONATE 0.12 % MT SOLN: 15.0000 mL | Freq: Once | OROMUCOSAL | Status: DC

## 2024-01-02 MED ORDER — ATORVASTATIN CALCIUM 80 MG PO TABS
80.0000 mg | ORAL_TABLET | Freq: Every day | ORAL | Status: DC
Start: 2024-01-02 — End: 2024-01-02
  Administered 2024-01-02: 80 mg via ORAL
  Filled 2024-01-02: qty 1

## 2024-01-02 MED ORDER — ORAL CARE MOUTH RINSE: 15.0000 mL | Freq: Once | OROMUCOSAL | Status: AC

## 2024-01-02 MED ORDER — BUPIVACAINE HCL (PF) 0.5 % IJ SOLN
INTRAMUSCULAR | Status: AC
  Filled 2024-01-02: qty 30

## 2024-01-02 MED ORDER — PROPOFOL 10 MG/ML IV BOLUS
INTRAVENOUS | Status: AC
  Filled 2024-01-02: qty 20

## 2024-01-02 MED ORDER — PROPOFOL 1000 MG/100ML IV EMUL
INTRAVENOUS | Status: AC
  Filled 2024-01-02: qty 100

## 2024-01-02 MED ORDER — INSULIN GLARGINE-YFGN 100 UNIT/ML ~~LOC~~ SOLN
15.0000 [IU] | Freq: Every day | SUBCUTANEOUS | Status: DC
Start: 2024-01-03 — End: 2024-01-02

## 2024-01-02 MED ORDER — CLOPIDOGREL BISULFATE 75 MG PO TABS: 75.0000 mg | ORAL_TABLET | Freq: Every day | ORAL | 3 refills | Status: DC

## 2024-01-02 MED ORDER — OXYCODONE HCL 5 MG PO TABS: 5.0000 mg | ORAL_TABLET | Freq: Once | ORAL | Status: DC | PRN

## 2024-01-02 MED ORDER — BUPIVACAINE HCL (PF) 0.5 % IJ SOLN
INTRAMUSCULAR | Status: DC | PRN
  Administered 2024-01-02: 5 mL

## 2024-01-02 MED ORDER — INSULIN GLARGINE-YFGN 100 UNIT/ML ~~LOC~~ SOLN
5.0000 [IU] | Freq: Once | SUBCUTANEOUS | Status: DC
  Filled 2024-01-02: qty 0.05

## 2024-01-02 MED ORDER — ORAL CARE MOUTH RINSE: 15.0000 mL | Freq: Once | OROMUCOSAL | Status: DC

## 2024-01-02 MED ORDER — LACTATED RINGERS IV SOLN
INTRAVENOUS | Status: DC
Start: 2024-01-02 — End: 2024-01-02

## 2024-01-02 MED ORDER — OXYCODONE HCL 5 MG/5ML PO SOLN: 5.0000 mg | Freq: Once | ORAL | Status: DC | PRN

## 2024-01-02 MED ORDER — PROPOFOL 500 MG/50ML IV EMUL
INTRAVENOUS | Status: DC | PRN
  Administered 2024-01-02: 100 ug/kg/min via INTRAVENOUS

## 2024-01-02 MED ORDER — TOUJEO MAX SOLOSTAR 300 UNIT/ML ~~LOC~~ SOPN: 100.0000 [IU] | PEN_INJECTOR | SUBCUTANEOUS | Status: DC

## 2024-01-02 MED ORDER — INSULIN ASPART 100 UNIT/ML IJ SOLN
0.0000 [IU] | INTRAMUSCULAR | Status: DC | PRN
Start: 2024-01-02 — End: 2024-01-02
  Administered 2024-01-02: 8 [IU] via SUBCUTANEOUS

## 2024-01-02 MED ORDER — MIDAZOLAM HCL 2 MG/2ML IJ SOLN
INTRAMUSCULAR | Status: AC
  Filled 2024-01-02: qty 2

## 2024-01-02 MED ORDER — MIDAZOLAM HCL 2 MG/2ML IJ SOLN
INTRAMUSCULAR | Status: DC | PRN
  Administered 2024-01-02: 2 mg via INTRAVENOUS

## 2024-01-02 MED ORDER — FENTANYL CITRATE (PF) 250 MCG/5ML IJ SOLN
INTRAMUSCULAR | Status: AC
  Filled 2024-01-02: qty 5

## 2024-01-02 MED ORDER — FENTANYL CITRATE (PF) 100 MCG/2ML IJ SOLN: 25.0000 ug | INTRAMUSCULAR | Status: DC | PRN

## 2024-01-02 MED ORDER — LACTATED RINGERS IV SOLN: INTRAVENOUS | Status: DC | PRN

## 2024-01-02 MED ORDER — ACETAMINOPHEN 10 MG/ML IV SOLN: 1000.0000 mg | Freq: Once | INTRAVENOUS | Status: DC | PRN

## 2024-01-02 MED ORDER — OXYCODONE HCL 5 MG PO TABS
5.0000 mg | ORAL_TABLET | ORAL | 0 refills | Status: DC | PRN
Start: 2024-01-02 — End: 2024-02-16

## 2024-01-02 MED ORDER — LIDOCAINE HCL 1 % IJ SOLN
INTRAMUSCULAR | Status: AC
  Filled 2024-01-02: qty 20

## 2024-01-02 MED ORDER — CHLORHEXIDINE GLUCONATE 0.12 % MT SOLN
OROMUCOSAL | Status: AC
  Filled 2024-01-02: qty 15

## 2024-01-02 MED ORDER — PROPOFOL 10 MG/ML IV BOLUS
INTRAVENOUS | Status: DC | PRN
  Administered 2024-01-02: 30 mg via INTRAVENOUS

## 2024-01-02 MED ORDER — LIDOCAINE HCL (PF) 1 % IJ SOLN
INTRAMUSCULAR | Status: DC | PRN
  Administered 2024-01-02: 5 mL

## 2024-01-02 MED ORDER — CHLORHEXIDINE GLUCONATE 0.12 % MT SOLN
15.0000 mL | Freq: Once | OROMUCOSAL | Status: AC
  Administered 2024-01-02: 15 mL via OROMUCOSAL

## 2024-01-02 MED ORDER — FENTANYL CITRATE (PF) 250 MCG/5ML IJ SOLN
INTRAMUSCULAR | Status: DC | PRN
  Administered 2024-01-02 (×2): 50 ug via INTRAVENOUS

## 2024-01-02 SURGICAL SUPPLY — 36 items
BLADE AVERAGE 25X9 (BLADE) IMPLANT
BLADE SURG 10 STRL SS (BLADE) ×1 IMPLANT
BLADE SURG 15 STRL LF DISP TIS (BLADE) ×1 IMPLANT
BNDG COHESIVE 3X5 TAN ST LF (GAUZE/BANDAGES/DRESSINGS) ×1 IMPLANT
BNDG ELASTIC 3INX 5YD STR LF (GAUZE/BANDAGES/DRESSINGS) ×1 IMPLANT
BNDG ELASTIC 4INX 5YD STR LF (GAUZE/BANDAGES/DRESSINGS) IMPLANT
BNDG ESMARK 4X9 LF (GAUZE/BANDAGES/DRESSINGS) ×1 IMPLANT
BNDG GAUZE DERMACEA FLUFF 4 (GAUZE/BANDAGES/DRESSINGS) IMPLANT
CHLORAPREP W/TINT 26 (MISCELLANEOUS) IMPLANT
DRSG ADAPTIC 3X8 NADH LF (GAUZE/BANDAGES/DRESSINGS) IMPLANT
DRSG XEROFORM 1X8 (GAUZE/BANDAGES/DRESSINGS) IMPLANT
ELECTRODE REM PT RTRN 9FT ADLT (ELECTROSURGICAL) ×1 IMPLANT
GAUZE PAD ABD 8X10 STRL (GAUZE/BANDAGES/DRESSINGS) IMPLANT
GAUZE SPONGE 2X2 STRL 8-PLY (GAUZE/BANDAGES/DRESSINGS) IMPLANT
GAUZE SPONGE 4X4 12PLY STRL (GAUZE/BANDAGES/DRESSINGS) ×1 IMPLANT
GAUZE STRETCH 2X75IN STRL (MISCELLANEOUS) ×1 IMPLANT
GAUZE XEROFORM 1X8 LF (GAUZE/BANDAGES/DRESSINGS) ×1 IMPLANT
GLOVE BIO SURGEON STRL SZ7.5 (GLOVE) ×1 IMPLANT
GLOVE BIOGEL PI IND STRL 7.5 (GLOVE) ×1 IMPLANT
GOWN STRL REUS W/ TWL LRG LVL3 (GOWN DISPOSABLE) ×2 IMPLANT
KIT BASIN OR (CUSTOM PROCEDURE TRAY) ×1 IMPLANT
NDL HYPO 25X1 1.5 SAFETY (NEEDLE) ×1 IMPLANT
NEEDLE HYPO 25X1 1.5 SAFETY (NEEDLE) ×1 IMPLANT
PACK ORTHO EXTREMITY (CUSTOM PROCEDURE TRAY) ×1 IMPLANT
PADDING CAST ABS COTTON 4X4 ST (CAST SUPPLIES) ×2 IMPLANT
SET HNDPC FAN SPRY TIP SCT (DISPOSABLE) IMPLANT
SPIKE FLUID TRANSFER (MISCELLANEOUS) IMPLANT
STOCKINETTE 4X48 STRL (DRAPES) IMPLANT
SUT ETHILON 3 0 FSLX (SUTURE) IMPLANT
SUT PROLENE 3 0 PS 2 (SUTURE) IMPLANT
SUT PROLENE 4 0 PS 2 18 (SUTURE) IMPLANT
SYR CONTROL 10ML LL (SYRINGE) ×1 IMPLANT
TUBE CONNECTING 12X1/4 (SUCTIONS) IMPLANT
UNDERPAD 30X36 HEAVY ABSORB (UNDERPADS AND DIAPERS) ×1 IMPLANT
WATER STERILE IRR 1000ML POUR (IV SOLUTION) ×1 IMPLANT
YANKAUER SUCT BULB TIP NO VENT (SUCTIONS) IMPLANT

## 2024-01-02 NOTE — Progress Notes (Signed)
 PHARMACIST LIPID MONITORING  [name] is a 68 y.o. male admitted on 12/29/2023 with sepsis due to cellulitis.  Pharmacy has been consulted to optimize lipid-lowering therapy with the indication of secondary prevention for clinical ASCVD.  Recent Labs:  Lipid Panel (last 6 months):   Lab Results  Component Value Date   CHOL 132 12/31/2023   TRIG 173 (H) 12/31/2023   HDL 33 (L) 12/31/2023   CHOLHDL 4.0 12/31/2023   VLDL 35 12/31/2023   LDLCALC 64 12/31/2023    Hepatic function panel (last 6 months):   Lab Results  Component Value Date   AST 23 01/02/2024   ALT 14 01/02/2024   ALKPHOS 78 01/02/2024   BILITOT 0.8 01/02/2024    SCr (since admission):   Serum creatinine: 0.99 mg/dL 16/10/96 0454 Estimated creatinine clearance: 65.4 mL/min  Current therapy and lipid therapy tolerance Current lipid-lowering therapy: atorvastatin  40 mg Previous lipid-lowering therapies (if applicable): N/A Documented or reported allergies or intolerances to lipid-lowering therapies (if applicable): N/A  Assessment:   LDL 64, which is not at goal of < 55, on atorvastatin  40 mg. Previously at goal 11 months ago on same therapy. Will increase atorvastatin  to 80 mg as he is close to goal. Patient agrees with changes to lipid-lowering therapy  Plan:    1.Statin intensity (high intensity recommended for all patients regardless of the LDL):  Increase atorvastatin  to 80mg   2.Add ezetimibe (if any one of the following):   Not indicated at this time.  3.Refer to lipid clinic:   No  4.Follow-up with:  Primary care provider - Roslyn Coombe, MD  5.Follow-up labs after discharge:  Changes in lipid therapy were made. Check a lipid panel in 8-12 weeks then annually.     Marybelle Smiling, PharmD 01/02/2024, 9:05 AM

## 2024-01-02 NOTE — Plan of Care (Signed)

## 2024-01-02 NOTE — Transfer of Care (Signed)
 Immediate Anesthesia Transfer of Care Note  Patient: [name]  Procedure(s) Performed: AMPUTATION, FOOT, RAY (Left: Fifth Toe)  Patient Location: PACU  Anesthesia Type:MAC  Level of Consciousness: drowsy and responds to stimulation  Airway & Oxygen Therapy: Patient Spontanous Breathing and Patient connected to nasal cannula oxygen  Post-op Assessment: Report given to RN and Post -op Vital signs reviewed and stable  Post vital signs: Reviewed and stable  Last Vitals:  Vitals Value Taken Time  BP 112/57 01/02/24 0820  Temp 97.7 01/02/24 0820  Pulse 59 01/02/24 0820  Resp 11 01/02/24 0820  SpO2 97 % 01/02/24 0820   Vitals shown include unfiled device data.   Patients Stated Pain Goal: 0 (01/01/24 2111)  Complications: No notable events documented.

## 2024-01-02 NOTE — Anesthesia Postprocedure Evaluation (Signed)
 Anesthesia Post Note  Patient: [name]  Procedure(s) Performed: AMPUTATION, FOOT, RAY (Left: Fifth Toe)     Patient location during evaluation: PACU Anesthesia Type: MAC Level of consciousness: awake and alert Pain management: pain level controlled Vital Signs Assessment: post-procedure vital signs reviewed and stable Respiratory status: spontaneous breathing, nonlabored ventilation and respiratory function stable Cardiovascular status: stable and blood pressure returned to baseline Postop Assessment: no apparent nausea or vomiting Anesthetic complications: no   There were no known notable events for this encounter.  Last Vitals:  Vitals:   01/02/24 0833 01/02/24 0844  BP: 102/86 122/73  Pulse: 72 73  Resp: 13 16  Temp: 36.5 C 37 C  SpO2: 98% 96%    Last Pain:  Vitals:   01/02/24 0844  TempSrc: Oral  PainSc:                  Antrell Tipler

## 2024-01-02 NOTE — Progress Notes (Signed)
 PT Cancellation Note  Patient Details Name: [name] MRN: [id number] DOB: Jan 21, 1956   Cancelled Treatment:    Reason Eval/Treat Not Completed: Other (comment). Pt left hospital prior to PT eval being able to be completed.  Harlei Lehrmann, PT, DPT Acute Rehabilitation Services Secure chat preferred Office #: [phone]    Jenna Moan 01/02/2024, 11:51 AM

## 2024-01-02 NOTE — Discharge Instructions (Signed)
   [facility]  Discharge Instructions  Lower Extremity Angiogram; Angioplasty/Stenting  Please refer to the following instructions for your post-procedure care. Your surgeon or physician assistant will discuss any changes with you.  Activity  Avoid lifting more than 8 pounds (1 gallons of milk) for 72 hours (3 days) after your procedure. You may walk as much as you can tolerate. It's OK to drive after 72 hours.  Bathing/Showering  You may shower the day after your procedure. If you have a bandage, you may remove it at 24- 48 hours. Clean your incision site with mild soap and water. Pat the area dry with a clean towel.  Diet  Resume your pre-procedure diet. There are no special food restrictions following this procedure. All patients with peripheral vascular disease should follow a low fat/low cholesterol diet. In order to heal from your surgery, it is CRITICAL to get adequate nutrition. Your body requires vitamins, minerals, and protein. Vegetables are the best source of vitamins and minerals. Vegetables also provide the perfect balance of protein. Processed food has little nutritional value, so try to avoid this.  Medications  Resume taking all of your medications unless your doctor tells you not to. If your incision is causing pain, you may take over-the-counter pain relievers such as acetaminophen (Tylenol)  Follow Up  Follow up will be arranged at the time of your procedure. You may have an office visit scheduled or may be scheduled for surgery. Ask your surgeon if you have any questions.  Please call us immediately for any of the following conditions: Severe or worsening pain your legs or feet at rest or with walking. Increased pain, redness, drainage at your groin puncture site. Fever of 101 degrees or higher. If you have any mild or slow bleeding from your puncture site: lie down, apply firm constant pressure over the area with a piece of  gauze or a clean wash cloth for 30 minutes- no peeking!, call 911 right away if you are still bleeding after 30 minutes, or if the bleeding is heavy and unmanageable.  Reduce your risk factors of vascular disease:  Stop smoking. If you would like help call [facility] at 1-800-QUIT-NOW (254-819-8059) or [place] at (250)353-2060. Manage your cholesterol Maintain a desired weight Control your diabetes Keep your blood pressure down  If you have any questions, please call the office at [phone]

## 2024-01-02 NOTE — Progress Notes (Signed)
  Progress Note    01/02/2024 9:03 AM * Day of Surgery *  Subjective:  no relevant complaints   Vitals:   01/02/24 0833 01/02/24 0844  BP: 102/86 122/73  Pulse: 72 73  Resp: 13 16  Temp: 97.7 F (36.5 C) 98.6 F (37 C)  SpO2: 98% 96%   Physical Exam: Lungs:  non labored Incisions:  R groin without hematoma Extremities:  palpable L ATA pulse Neurologic: A&O  CBC    Component Value Date/Time   WBC 10.7 (H) 01/02/2024 0408   RBC 4.53 01/02/2024 0408   HGB 15.1 01/02/2024 0408   HCT 42.4 01/02/2024 0408   PLT 260 01/02/2024 0408   MCV 93.6 01/02/2024 0408   MCH 33.3 01/02/2024 0408   MCHC 35.6 01/02/2024 0408   RDW 12.8 01/02/2024 0408   LYMPHSABS 2.6 01/01/2024 0814   MONOABS 0.8 01/01/2024 0814   EOSABS 0.3 01/01/2024 0814   BASOSABS 0.1 01/01/2024 0814    BMET    Component Value Date/Time   NA 134 (L) 01/02/2024 0408   NA 138 06/16/2019 1144   K 4.2 01/02/2024 0408   CL 97 (L) 01/02/2024 0408   CO2 28 01/02/2024 0408   GLUCOSE 270 (H) 01/02/2024 0408   BUN 11 01/02/2024 0408   BUN 15 06/16/2019 1144   CREATININE 0.99 01/02/2024 0408   CALCIUM  9.1 01/02/2024 0408   GFRNONAA >60 01/02/2024 0408   GFRAA >60 02/29/2020 0937    INR    Component Value Date/Time   INR 1.1 06/04/2019 2318     Intake/Output Summary (Last 24 hours) at 01/02/2024 0903 Last data filed at 01/02/2024 0818 Gross per 24 hour  Intake 303 ml  Output 1400 ml  Net -1097 ml     Assessment/Plan:  68 y.o. male is s/p L SFA stenting  Mr. Aker underwent angiogram with L SFA stenting yesterday.  R groin is without hematoma.  He has a palpable L ATA pulse this morning.  Ok for discharge from vascular standpoint.  Office will arrange LLE arterial duplex and ABI in 4-6 weeks.  Continue aspirin , plavix, statin daily   Cordie Deters, PA-C [facility] [phone] 01/02/2024 9:03 AM

## 2024-01-02 NOTE — Progress Notes (Signed)
 History and Physical Interval Note:  01/02/2024 7:18 AM  [name]  has presented today for surgery, with the diagnosis of osteomyelitis of fifth toe left foot.  The various methods of treatment have been discussed with the patient and family. After consideration of risks, benefits and other options for treatment, the patient has consented to   Procedure(s) with comments: AMPUTATION, Left 5th toe amp (Left) - FIFTH TOE as a surgical intervention.  The patient's history has been reviewed, patient examined, no change in status, stable for surgery.  I have reviewed the patient's chart and labs.  Questions were answered to the patient's satisfaction.     Karlene Overcast Amare Kontos

## 2024-01-02 NOTE — Op Note (Signed)
 Full Operative Report  Date of Operation: 8:22 AM, 01/02/2024   Patient: [name] - 68 y.o. male  Surgeon: Evertt Hoe, DPM   Assistant: None  Diagnosis: Osteomyelitis of FIFTH TOE  Procedure:  1. Amputation of fifth toe at Mpj level, Left foot    Anesthesia: Monitor Anesthesia Care  Arvie Latus, MD  Anesthesiologist: Arvie Latus, MD CRNA: Bernida Brink A, CRNA   Estimated Blood Loss: Minimal   Hemostasis: 1) Anatomical dissection, mechanical compression, electrocautery 2) No tourniquet  Implants: * No implants in log *  Materials: Prolene 3-0  Injectables: 1) Pre-operatively: 10 cc of 50:50 mixture 1%lidocaine  plain and 0.5% marcaine  plain 2) Post-operatively: None   Specimens: - Pathology: L 5th toe - Microbiology: bone culture distal phalanx L 5th toe   Antibiotics: IV antibiotics given per schedule on the floor  Drains: None  Complications: Patient tolerated the procedure well without complication.   Operative findings: As below in detailed report  Indications for Procedure: [name] presents to [place], Karlene Overcast, DPM with a chief complaint of Gangrene and underlying osteomyelitis of L 5th toe The patient has failed conservative treatments of various modalities. At this time the patient has elected to proceed with surgical correction. All alternatives, risks, and complications of the procedures were thoroughly explained to the patient. Patient exhibits appropriate understanding of all discussion points and informed consent was signed and obtained in the chart with no guarantees to surgical outcome given or implied.  Description of Procedure: Patient was brought to the operating room. Patient remained on their hospital bed in the supine position. A surgical timeout was performed and all members of the operating room, the procedure, and the surgical site were identified. anesthesia occurred as per anesthesia record.  Local anesthetic as previously described was then injected about the operative field in a local infiltrative block.  The operative lower extremity as noted above was then prepped and draped in the usual sterile manner. The following procedure then began.  Attention was directed to the 5th digit on the Left foot. A full-thickness incision encompassing the entire digit was made using a #15 blade. Dissection was carried down to bone. The toe was secured with a towel clamp, further dissected in its entirety, and disarticulated at the MPJ and passed to the back table as a gross specimen. This was then labled and sent to pathology. The bone was noted to be soft and eroded, and consistent with osteomyelitis. A bone culture was harvested with rongeur from the distal phalanx and sent for micro.  All remaining necrotic and devitalized soft tissue structures were visualized and dissected away using sharp and dull dissection. Care was taken to protect all neurovascular structures throughout the dissection. All bleeders were cauterized as necessary.   The area was then flushed with copious amounts of sterile saline. Then using the suture materials previously described, the site was closed in anatomic layers and the skin was well approximated under minimal tension.   The surgical site was then dressed with xerofrom 4x4 kerlix and ace wrap. The patient tolerated both the procedure and anesthesia well with vital signs stable throughout. The patient was transferred in good condition and all vital signs stable  from the OR to recovery under the discretion of anesthesia.  Condition: Vital signs stable, neurovascular status unchanged from preoperative   Surgical plan:  Expect clean margin. WBAT in post op shoe. Stable for DC when cleared by primary, follow up next week Thursday in office for  dsg change leave intact until then. 7 days PO abx should be sufficient on DC.  The patient will be WBAT in a post op shoe to the  operative limb until further instructed. The dressing is to remain clean, dry, and intact. Will continue to follow unless noted elsewhere.   Russ Course, DPM [facility]

## 2024-01-02 NOTE — Discharge Summary (Signed)
 [facility]  Physician Discharge Summary   Patient ID: CAMRY THEISS MRN: [id number] DOB/AGE: 67/09/1955 67 y.o.  Admit date: 12/29/2023 Discharge date: 01/02/2024    PCP: Roslyn Coombe, MD  DISCHARGE DIAGNOSES:    Sepsis due to cellulitis [facility])   Essential hypertension   Dyslipidemia   Diabetes (HCC)   Hypokalemia   Insulin  dependent type 2 diabetes mellitus (HCC)   Gastroenteritis   Uncontrolled type 2 diabetes mellitus with hyperglycemia, with long-term current use of insulin  (HCC)   Enteritis, enteropathogenic E. coli   Acute osteomyelitis of toe, left (HCC)   RECOMMENDATIONS FOR OUTPATIENT FOLLOW UP: Outpatient follow-up with vascular surgery and podiatry   Home Health: None Equipment/Devices: None  CODE STATUS: Full code  DISCHARGE CONDITION: fair  Diet recommendation: As before  INITIAL HISTORY: 68 year old gentleman history of hypertension, hyperlipidemia, CAD, insulin -dependent type 2 diabetes admitted to the hospital with sepsis found secondary to left toe wound/cellulitis and noted to be hyperglycemic without acidosis. Patient placed on the Endo tool transition to subcutaneous insulin  and sliding scale insulin . Patient placed empirically on IV antibiotics. MRI obtained of the left foot concerning for osteomyelitis of the toe. Patient was hospitalized for further management.   HOSPITAL COURSE:   Left toe cellulitis/osteomyelitis/sepsis POA -Patient met criteria for sepsis on admission with tachycardia, leukocytosis, and source concerning for left fifth toe cellulitis/osteomyelitis with no evidence of acute endorgan damage. - Blood cultures obtained and pending with no growth to date. - MRSA PCR negative. - MRI of the left foot with osteomyelitis of the distal phalanx of the small toe, edema in the medial sesamoid first digit with a transverse cleft in the medial sesamoid, possibilities include sesamoid fracture versus sesamoiditis in the setting of  incidental bifid sesamoid.  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  Accentuated cutaneous enhancement in the small toe potentially from cellulitis.  Mild degenerative arthropathy along the proximal lateral portion of the medial cuneiform. Podiatry was consulted.  ID was consulted. Recommended vancomycin  and Unasyn. Patient underwent amputation of the fifth toe this morning.  WBC noted to be 10.7.   Repeating on ID input regarding antibiotics.  Podiatry was okay with oral antibiotics.  Patient expressed wish to go home.  Did not want to wait.  So he was discharged on oral antibiotics.  ADDENDUM Wound cultures grew Morganella and Enterococcus after discharge.  Sensitivities reviewed.  Ciprofloxacin  has been called in to his pharmacy in addition to the Augmentin that he was discharged on from the hospital.  Message left for patient on his mobile number.   Concern for peripheral artery disease ABIs were noted to be abnormal.  Vascular surgery was consulted.  Patient underwent angiogram with left SFA stent on 5/23 and noted to be on aspirin  Plavix statin.  Vascular will arrange outpatient follow-up.    Diabetes mellitus type 2, uncontrolled with hyperglycemia  Initially required insulin  infusion.  Subsequently transitioned to glargine and SSI. Home medication list reviewed.  Looks like he is currently on Toujeo  prior to admission.  Compliance is questionable.  There is also mention of semaglutide  in his home medication list. HbA1c 14.3.   Essential hypertension Will remain just on ARB.  Chlorthalidone  and spironolactone  discontinued.   Hyperlipidemia - Fasting lipid panel with a LDL of 64.  Patient is on statin - Outpatient follow-up with PCP.    Acute diarrhea secondary to enteropathogenic E. coli C. difficile PCR negative.  GI pathogen panel positive  for enteropathogenic E. coli.   Supportive care.  Diarrhea appears to be  slowing down.   Pseudohyponatremia -Secondary to hyperglycemia.   Hypokalemia Was supplemented.  Patient is stable.  Adamant about going home today.  Thought to be medically stable for discharge.   PERTINENT LABS:  The results of significant diagnostics from this hospitalization (including imaging, microbiology, ancillary and laboratory) are listed below for reference.    Microbiology: Recent Results (from the past 240 hours)  Urine Culture (for pregnant, neutropenic or urologic patients or patients with an indwelling urinary catheter)     Status: None   Collection Time: 12/29/23  6:35 PM   Specimen: Urine, Clean Catch  Result Value Ref Range Status   Specimen Description   Final    URINE, CLEAN CATCH Performed at [facility], [address] [place], [place] [address]    Special Requests   Final    NONE Performed at [facility], [address], [place], [place] [address]    Culture   Final    NO GROWTH Performed at [facility], [address], [place], [place] [address]    Report Status 12/31/2023 FINAL  Final  Culture, blood (routine x 2)     Status: None   Collection Time: 12/29/23  7:31 PM   Specimen: BLOOD RIGHT ARM  Result Value Ref Range Status   Specimen Description   Final    BLOOD RIGHT ARM Performed at [facility], [address] [place], [place] [address]    Special Requests   Final    BOTTLES DRAWN AEROBIC AND ANAEROBIC Blood Culture adequate volume Performed at [facility], [address], [place], [place] [address]    Culture   Final    NO GROWTH 5 DAYS Performed at [facility], [address], [place], [place] [address]    Report Status 01/03/2024 FINAL  Final  Culture, blood (routine x 2)     Status: None   Collection Time: 12/29/23  7:31 PM   Specimen: BLOOD LEFT ARM  Result Value Ref Range Status   Specimen Description   Final    BLOOD LEFT ARM Performed at  [facility], [address], [place], [place] [address]    Special Requests   Final    BOTTLES DRAWN AEROBIC AND ANAEROBIC Blood Culture results may not be optimal due to an inadequate volume of blood received in culture bottles Performed at [facility], [address], [place], [place] [address]    Culture   Final    NO GROWTH 5 DAYS Performed at [facility], [address], [place], [place] [address]    Report Status 01/03/2024 FINAL  Final  MRSA Next Gen by PCR, Nasal     Status: None   Collection Time: 12/30/23 12:10 AM   Specimen: Nasal Mucosa; Nasal Swab  Result Value Ref Range Status   MRSA by PCR Next Gen NOT DETECTED NOT DETECTED Final    Comment: (NOTE) The GeneXpert MRSA Assay (FDA approved for NASAL specimens only), is one component of a comprehensive MRSA colonization surveillance program. It is not intended to diagnose MRSA infection nor to guide or monitor treatment for MRSA infections. Test performance is not FDA approved in patients less than 64 years old. Performed at [facility], [address], [place], [place] [address]   C Difficile Quick Screen w PCR reflex  Status: None   Collection Time: 12/30/23 10:00 AM   Specimen: STOOL  Result Value Ref Range Status   C Diff antigen NEGATIVE NEGATIVE Final   C Diff toxin NEGATIVE NEGATIVE Final   C Diff interpretation No C. difficile detected.  Final    Comment: Performed at [facility], [address] [place], [place] [address]  Gastrointestinal Panel by PCR , Stool     Status: Abnormal   Collection Time: 12/30/23 10:00 AM   Specimen: STOOL  Result Value Ref Range Status   Campylobacter species NOT DETECTED NOT DETECTED Final   Plesimonas shigelloides NOT DETECTED NOT DETECTED Final   Salmonella species NOT DETECTED NOT DETECTED Final   Yersinia enterocolitica NOT DETECTED NOT DETECTED Final   Vibrio species NOT DETECTED NOT DETECTED  Final   Vibrio cholerae NOT DETECTED NOT DETECTED Final   Enteroaggregative E coli (EAEC) NOT DETECTED NOT DETECTED Final   Enteropathogenic E coli (EPEC) DETECTED (A) NOT DETECTED Final    Comment: RESULT CALLED TO, READ BACK BY AND VERIFIED WITH: Jennie Moeller, RN 12/31/23 0931 MW    Enterotoxigenic E coli (ETEC) NOT DETECTED NOT DETECTED Final   Shiga like toxin producing E coli (STEC) NOT DETECTED NOT DETECTED Final   Shigella/Enteroinvasive E coli (EIEC) NOT DETECTED NOT DETECTED Final   Cryptosporidium NOT DETECTED NOT DETECTED Final   Cyclospora cayetanensis NOT DETECTED NOT DETECTED Final   Entamoeba histolytica NOT DETECTED NOT DETECTED Final   Giardia lamblia NOT DETECTED NOT DETECTED Final   Adenovirus F40/41 NOT DETECTED NOT DETECTED Final   Astrovirus NOT DETECTED NOT DETECTED Final   Norovirus GI/GII NOT DETECTED NOT DETECTED Final   Rotavirus A NOT DETECTED NOT DETECTED Final   Sapovirus (I, II, IV, and V) NOT DETECTED NOT DETECTED Final    Comment: Performed at [facility], [address] [place], [place] [address]  Aerobic/Anaerobic Culture w Gram Stain (surgical/deep wound)     Status: None (Preliminary result)   Collection Time: 01/02/24  8:17 AM   Specimen: Bone; Tissue  Result Value Ref Range Status   Specimen Description BONE  Final   Special Requests BONE FROM LEFT FIFTH TOE FOR CULTURES  Final   Gram Stain RARE WBC SEEN RARE GRAM POSITIVE COCCI   Final   Culture   Final    FEW GRAM NEGATIVE RODS IDENTIFICATION TO FOLLOW CULTURE REINCUBATED FOR BETTER GROWTH Performed at [facility], [address] [place], [place] [address]    Report Status PENDING  Incomplete     Labs:   Basic Metabolic Panel: Recent Labs  Lab 12/30/23 0311 12/31/23 0312 01/01/24 0405 01/01/24 0814 01/02/24 0408  NA 132* 134* CORRECTED RESULTS CALLED TO: 139 134*  K 3.3* 4.1 CORRECTED RESULTS CALLED TO: 3.7 4.2  CL 94* 104 CORRECTED RESULTS CALLED TO: 104  97*  CO2 28 22 CORRECTED RESULTS CALLED TO: 27 28  GLUCOSE 137* 263* CORRECTED RESULTS CALLED TO: 91 270*  BUN 20 15 CORRECTED RESULTS CALLED TO: 10 11  CREATININE 0.87 0.93 CORRECTED RESULTS CALLED TO: 1.05 0.99  CALCIUM  9.2 8.5* CORRECTED RESULTS CALLED TO: 9.1 9.1  MG 1.8 1.8 CORRECTED RESULTS CALLED TO: 1.6* 2.0   Liver Function Tests: Recent Labs  Lab 01/02/24 0408  AST 23  ALT 14  ALKPHOS 78  BILITOT 0.8  PROT 6.2*  ALBUMIN 3.0*    CBC: Recent Labs  Lab 12/29/23 1839 12/30/23 0311 12/31/23 0312 01/01/24 0814 01/02/24 0408  WBC  15.0* 14.4* 8.8 9.4 10.7*  NEUTROABS 11.8*  --  4.7 5.6  --   HGB 16.5 15.6 14.6 13.9 15.1  HCT 44.6 43.6 41.9 37.9* 42.4  MCV 89.9 93.6 96.1 92.4 93.6  PLT 270 212 198 231 260    CBG: Recent Labs  Lab 01/01/24 1749 01/01/24 2131 01/02/24 0600 01/02/24 0656 01/02/24 0819  GLUCAP 117* 297* 262* 263* 212*     IMAGING STUDIES DG Foot 2 Views Left Result Date: 01/02/2024 CLINICAL DATA:  Postop fifth toe amputation. EXAM: LEFT FOOT - 2 VIEW COMPARISON:  Radiograph 12/29/2023 FINDINGS: Interval resection of the fifth toe. The fifth metatarsal head is smooth. Expected postsurgical changes in the soft tissues. The remainder the exam is unchanged. IMPRESSION: Interval resection of the fifth toe. Electronically Signed   By: Chadwick Colonel M.D.   On: 01/02/2024 11:22   PERIPHERAL VASCULAR CATHETERIZATION Result Date: 01/01/2024 Images from the original result were not included. Patient name: MAXMILIAN TROSTEL MRN: [id number] DOB: 04-14-1956 Sex: male 01/01/2024 Pre-operative Diagnosis: Atherosclerosis native arteries left lower extremity with small toe ulceration and osteomyelitis Post-operative diagnosis:  Same Surgeon:  Sarajane Cumming. Vikki Graves, MD Procedure Performed: 1.  Percutaneous ultrasound-guided access of Mynx device closure right common femoral artery 2.  Catheter in aorta and aortogram with bilateral lower extremity angiography 3.  Catheter  selection left SFA and stent of left SFA with 6 x 100 mm Eluvia postdilated with 5 mm balloon 4.  Moderate sedation with fentanyl  and Versed  for 30 minutes Indications: 68 year old male with history of known bilateral superficial femoral artery disease now with osteomyelitis of the left small toe.  He is indicated for angiography with possible intervention. Findings: The aorta was free of flow-limiting stenosis in the repaired renal arteries bilaterally.  The left common iliac artery is calcified however there is no flow-limiting stenosis and bilateral hypogastric arteries are patent as are the bilateral common iliac and external iliac arteries.  The right common femoral artery is patent and there is a flush occlusion of the right SFA and he reconstitutes above the knee popliteal artery with dominant two-vessel runoff via the anterior tibial and posterior tibial artery and there is also more diminutive peroneal runoff.  On the left side which is the site of interest there appears to be stenosis of the takeoff of the left SFA but there is only a 5 mmHg gradient across this area of concern.  The mid SFA has approximately 70% stenosis with disease proximally and distally of this and this was stented to 0% residual stenosis.  There was brisk runoff via the anterior tibial and posterior tibial arteries which mirrors the right side and there is also a diminutive peroneal.  At completion there is a 1+ palpable anterior tibial and posterior tibial artery at the ankle. Patient is optimized from a vascular standpoint for left fifth toe amputation.  Procedure:  The patient was identified in the holding area and taken to room 8.  The patient was then placed supine on the table and prepped and draped in the usual sterile fashion.  A time out was called.  Ultrasound was used to evaluate the right common femoral artery.  There was noted to be some posterior plaque with the anterior artery was patent and free of disease and this  was cannulated with micropuncture needle followed by wire sheath using direct ultrasound visualization and an image was saved the permanent record.  Concomitantly we administered fentanyl  and Versed  as moderate sedation his vital signs  were monitored throughout the case.  We placed a Bentson wire followed by 5 [place] sheath and Omni catheter to the level of L1 and aortogram was performed followed by pelvic angiography with RAO angulation followed by crossing the bifurcation and placing the catheter in the left common femoral artery and performing left lower extremity angiography.  With the above findings we placed a Glidewire advantage into the left SFA and a 6 French sheath was placed in the left SFA as well across the area of concern stenosis in the proximal SFA.  Patient was fully heparinized at this time.  We then crossed the mid SFA stenosis with Glidewire advantage confirmed intraluminal access and and primarily stented with a 6 x 100 mm drug-eluting stent followed by postdilatation with 5 mm balloon.  Completion demonstrated no residual stenosis.  We then did a pullback gradient across the proximal SFA and there was only a 5 mmHg gradient which did not give rise for concern and no intervention was undertaken.  We then exchanged for a short 6 French sheath in the right groin and perform right lower extremity angiography and then deployed a minx device.  The patient tolerated the procedure without any complication. Contrast: 90 cc Brandon C. Vikki Graves, MD [facility] Office: [phone] Pager: [phone]   VAS US  ABI WITH/WO TBI Result Date: 01/01/2024  LOWER EXTREMITY DOPPLER STUDY Patient Name:  MATAI CARPENITO  Date of Exam:   12/31/2023 Medical Rec #: [id number]      Accession #:    [id number] Date of Birth: 03/05/1956      Patient Gender: M Patient Age:   56 years Exam Location:  [facility] Procedure:      VAS US  ABI WITH/WO TBI Referring Phys:  --------------------------------------------------------------------------------  Indications: Peripheral artery disease. High Risk Factors: Hypertension, Diabetes.  Comparison Study: 03/28/2020 - Right: Resting right ankle-brachial index                   indicates moderate right lower extremity arterial disease. The                   right toe-brachial index is abnormal.                    Left: Resting left ankle-brachial index is within normal                   range. No evidence of significant left lower extremity                   arterial disease. The left toe-brachial index is normal. Performing Technologist: Birda Buffy RVT  Examination Guidelines: A complete evaluation includes at minimum, Doppler waveform signals and systolic blood pressure reading at the level of bilateral brachial, anterior tibial, and posterior tibial arteries, when vessel segments are accessible. Bilateral testing is considered an integral part of a complete examination. Photoelectric Plethysmograph (PPG) waveforms and toe systolic pressure readings are included as required and additional duplex testing as needed. Limited examinations for reoccurring indications may be performed as noted.  ABI Findings: +---------+------------------+-----+----------+--------+ Right    Rt Pressure (mmHg)IndexWaveform  Comment  +---------+------------------+-----+----------+--------+ Brachial 160                    triphasic          +---------+------------------+-----+----------+--------+ PTA      65  0.41 monophasic         +---------+------------------+-----+----------+--------+ DP       62                0.39 monophasic         +---------+------------------+-----+----------+--------+ Great Toe                                 Absent   +---------+------------------+-----+----------+--------+ +---------+------------------+-----+-----------+-------+ Left     Lt Pressure (mmHg)IndexWaveform   Comment  +---------+------------------+-----+-----------+-------+ Brachial 132                    triphasic          +---------+------------------+-----+-----------+-------+ PTA      143               0.89 multiphasic        +---------+------------------+-----+-----------+-------+ DP       110               0.69 multiphasic        +---------+------------------+-----+-----------+-------+ Great Toe                                  Absent  +---------+------------------+-----+-----------+-------+ +-------+-----------+-----------+------------+------------+ ABI/TBIToday's ABIToday's TBIPrevious ABIPrevious TBI +-------+-----------+-----------+------------+------------+ Right  0.41                                           +-------+-----------+-----------+------------+------------+ Left   0.89                                           +-------+-----------+-----------+------------+------------+  Summary: Right: Resting right ankle-brachial index indicates severe right lower extremity arterial disease. Unable to obtain TBI due to absent waveforms. Left: Resting left ankle-brachial index indicates mild left lower extremity arterial disease. ABI's are likely falsely elevated due to medial calcification. Unable to obtain TBI due to absent waveforms. *See table(s) above for measurements and observations.  Electronically signed by Delaney Fearing on 01/01/2024 at 8:19:59 AM.    Final    MR FOOT LEFT W WO CONTRAST Result Date: 12/30/2023 CLINICAL DATA:  Open wound along the small toe, possible osteomyelitis EXAM: MRI OF THE LEFT FOREFOOT WITHOUT AND WITH CONTRAST TECHNIQUE: Multiplanar, multisequence MR imaging of the left forefoot was performed both before and after administration of intravenous contrast. CONTRAST:  6mL GADAVIST GADOBUTROL 1 MMOL/ML IV SOLN COMPARISON:  12/29/2023 FINDINGS: Bones/Joint/Cartilage Abnormal edema and enhancement in the distal phalanx small toe compatible with  osteomyelitis. Edema in the medial sesamoid of the first digit with a transverse cleft in the medial sesamoid, possibilities include sesamoid fracture versus sesamoiditis in the setting of incidental bifid sesamoid. Mild degenerative arthropathy along the proximal lateral portion of the medial cuneiform. Ligaments Lisfranc ligament intact. Muscles and Tendons Fusiform thickening of the distal medial band of the plantar fascia on image 10 series 7 compatible with plantar fibromatosis. Soft tissues Accentuated cutaneous enhancement in the small toe potentially from cellulitis. IMPRESSION: 1. Osteomyelitis of the distal phalanx small toe. 2. Edema in the medial sesamoid first digit with a transverse cleft in the medial sesamoid, possibilities include sesamoid fracture versus sesamoiditis in the setting of incidental  bifid sesamoid. 3. 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 4. Accentuated cutaneous enhancement in the small toe potentially from cellulitis. 5. Mild degenerative arthropathy along the proximal lateral portion of the medial cuneiform. Electronically Signed   By: Freida Jes M.D.   On: 12/30/2023 14:08   DG Foot 2 Views Left Result Date: 12/29/2023 CLINICAL DATA:  pinky toe osteo? EXAM: LEFT FOOT - 2 VIEW COMPARISON:  None Available. FINDINGS: No acute fracture or dislocation. No aggressive osseous lesion. Mild diffuse arthritis of imaged joints. Calcaneal spur noted along the Achilles tendon and Plantar aponeurosis attachment sites. There is subtle ossific density along the medial aspect of the proximal portion of the distal phalanx of fifth toe. However, no discrete adjacent cortical defect noted. There is diffuse soft tissue swelling of the fifth toe. No discrete soft tissue defect or air within the soft tissue. Findings are equivocal for acute osteomyelitis. Correlate clinically to determine the need for additional imaging with  more sensitive modality of MRI. No radiopaque foreign bodies. IMPRESSION: 1. No acute osseous abnormality of the left foot. 2. There is subtle ossific density along the medial aspect of the proximal portion of the distal phalanx of fifth toe. However, no discrete adjacent cortical defect noted. There is diffuse soft tissue swelling of the fifth toe. No discrete soft tissue defect or air within the soft tissue. Findings are equivocal for acute osteomyelitis. Electronically Signed   By: Beula Brunswick M.D.   On: 12/29/2023 19:29    DISCHARGE EXAMINATION: Please see progress note from earlier today  DISPOSITION: Home  Discharge Instructions     Call MD for:  difficulty breathing, headache or visual disturbances   Complete by: As directed    Call MD for:  extreme fatigue   Complete by: As directed    Call MD for:  persistant dizziness or light-headedness   Complete by: As directed    Call MD for:  persistant nausea and vomiting   Complete by: As directed    Call MD for:  severe uncontrolled pain   Complete by: As directed    Call MD for:  temperature >100.4   Complete by: As directed    Diet - low sodium heart healthy   Complete by: As directed    Diet Carb Modified   Complete by: As directed    Discharge instructions   Complete by: As directed    Please follow instructions provided by your surgeons.  You were cared for by a hospitalist during your hospital stay. If you have any questions about your discharge medications or the care you received while you were in the hospital after you are discharged, you can call the unit and asked to speak with the hospitalist on call if the hospitalist that took care of you is not available. Once you are discharged, your primary care physician will handle any further medical issues. Please note that NO REFILLS for any discharge medications will be authorized once you are discharged, as it is imperative that you return to your primary care physician (or  establish a relationship with a primary care physician if you do not have one) for your aftercare needs so that they can reassess your need for medications and monitor your lab values. If you do not have a primary care physician, you can call [phone] for a physician referral.   Discharge wound care:   Complete by: As directed    Leave dressing as  is.  Podiatry will change dressing at follow-up.   Increase activity slowly   Complete by: As directed         Allergies as of 01/02/2024       Reactions   Cefepime  Hives   Had allergic reaction to one of these 3 agents, unclear which one   Metoprolol  Hives   Had allergic reaction to one of these 3 agents, unclear which one *Per RN, highly likely this is the cause of allergic rxn   Myrbetriq  [mirabegron ] Hives   Had allergic reaction to one of these 3 agents, unclear which one        Medication List     STOP taking these medications    chlorthalidone  25 MG tablet Commonly known as: HYGROTON    spironolactone  25 MG tablet Commonly known as: ALDACTONE        TAKE these medications    amoxicillin-clavulanate 875-125 MG tablet Commonly known as: AUGMENTIN Take 1 tablet by mouth 2 (two) times daily for 7 days.   aspirin  EC 81 MG tablet Take 1 tablet (81 mg total) by mouth daily.   atorvastatin  40 MG tablet Commonly known as: LIPITOR Take 1 tablet by mouth once daily   citalopram  20 MG tablet Commonly known as: CELEXA  Take 1 tablet by mouth once daily   clopidogrel 75 MG tablet Commonly known as: PLAVIX Take 1 tablet (75 mg total) by mouth daily with breakfast.   irbesartan  300 MG tablet Commonly known as: AVAPRO  TAKE 1 TABLET BY MOUTH DAILY   loratadine  10 MG tablet Commonly known as: CLARITIN  Take 10 mg by mouth daily as needed.   ONE TOUCH ULTRA 2 w/Device Kit Use as directed to check blood sugars twice a day   OneTouch Delica Plus Lancet30G Misc CHECK BLOOD SUGAR TWICE DAILY   OneTouch Ultra test  strip Generic drug: glucose blood CHECK BLOOD SUGAR TWICE DAILY   oxyCODONE  5 MG immediate release tablet Commonly known as: Oxy IR/ROXICODONE  Take 1 tablet (5 mg total) by mouth every 4 (four) hours as needed for moderate pain (pain score 4-6).   Ozempic  (0.25 or 0.5 MG/DOSE) 2 MG/3ML Sopn Generic drug: Semaglutide (0.25 or 0.5MG /DOS) Inject 0.5 mg into the skin once a week.   pantoprazole  40 MG tablet Commonly known as: PROTONIX  TAKE 1 TABLET BY MOUTH DAILY   Toujeo  Max SoloStar 300 UNIT/ML Solostar Pen Generic drug: insulin  glargine (2 Unit Dial) Inject 100 Units into the skin every morning.   VITAMIN D  (CHOLECALCIFEROL) PO Take 5,000 Units by mouth daily.               Discharge Care Instructions  (From admission, onward)           Start     Ordered   01/02/24 0000  Discharge wound care:       Comments: Leave dressing as is.  Podiatry will change dressing at follow-up.   01/02/24 1055              Follow-up Information     [facility] Follow up in 5 week(s).   Specialty: Vascular Surgery Contact information: [address] [place]  [address] [phone]        Evertt Hoe, DPM Follow up on 01/07/2024.   Specialty: Actuary information: [address] [place] [address] [phone]  TOTAL DISCHARGE TIME: 35 mins  Greycen [facility] [employer] on [email or web address]  01/03/2024, 10:12 AM

## 2024-01-02 NOTE — Anesthesia Preprocedure Evaluation (Signed)
 Anesthesia Evaluation  Patient identified by MRN, date of birth, ID band Patient awake    Reviewed: Allergy & Precautions, NPO status , Patient's Chart, lab work & pertinent test results  History of Anesthesia Complications Negative for: history of anesthetic complications  Airway Mallampati: III  TM Distance: >3 FB Neck ROM: Full    Dental  (+) Edentulous Upper, Edentulous Lower, Dental Advisory Given   Pulmonary neg shortness of breath, asthma , neg sleep apnea, neg COPD, neg recent URI, Current Smoker and Patient abstained from smoking.   breath sounds clear to auscultation       Cardiovascular hypertension, + CAD and + Peripheral Vascular Disease   Rhythm:Regular  1. Left ventricular ejection fraction, by visual estimation, is 60 to  65%. The left ventricle has normal function. Normal left ventricular size.  There is moderately increased left ventricular hypertrophy.   2. Elevated left ventricular end-diastolic pressure.   3. Left ventricular diastolic Doppler parameters are consistent with  impaired relaxation pattern of LV diastolic filling.   4. Global right ventricle has normal systolic function.The right  ventricular size is normal. No increase in right ventricular wall  thickness.   5. Left atrial size was normal.   6. Right atrial size was normal.   7. Mild mitral annular calcification.   8. The mitral valve is grossly normal. Trace mitral valve regurgitation.   9. The tricuspid valve is grossly normal. Tricuspid valve regurgitation  is trivial.  10. The aortic valve is grossly normal Aortic valve regurgitation was not  visualized by color flow Doppler. Mild aortic valve sclerosis without  stenosis.  11. The pulmonic valve was grossly normal. Pulmonic valve regurgitation is  not visualized by color flow Doppler.  12. The interatrial septum was not well visualized.     Neuro/Psych  PSYCHIATRIC DISORDERS Anxiety      negative neurological ROS     GI/Hepatic Neg liver ROS,GERD  Medicated,,  Endo/Other  diabetes, Poorly Controlled, Insulin  Dependent  Lab Results      Component                Value               Date                      HGBA1C                   14.3 (H)            12/31/2023             Renal/GU Lab Results      Component                Value               Date                      NA                       134 (L)             01/02/2024                K                        4.2  01/02/2024                CO2                      28                  01/02/2024                GLUCOSE                  270 (H)             01/02/2024                BUN                      11                  01/02/2024                CREATININE               0.99                01/02/2024                CALCIUM                   9.1                 01/02/2024                GFR                      65.74               01/13/2023                GFRNONAA                 >60                 01/02/2024                Musculoskeletal negative musculoskeletal ROS (+)    Abdominal   Peds  Hematology Lab Results      Component                Value               Date                      WBC                      10.7 (H)            01/02/2024                HGB                      15.1                01/02/2024                HCT                      42.4                01/02/2024  MCV                      93.6                01/02/2024                PLT                      260                 01/02/2024              Anesthesia Other Findings   Reproductive/Obstetrics                              Anesthesia Physical Anesthesia Plan  ASA: 4  Anesthesia Plan: MAC   Post-op Pain Management: Minimal or no pain anticipated   Induction: Intravenous  PONV Risk Score and Plan: 1 and Treatment may vary due to age or medical condition and  Propofol  infusion  Airway Management Planned: Nasal Cannula, Natural Airway and Simple Face Mask  Additional Equipment: None  Intra-op Plan:   Post-operative Plan:   Informed Consent: I have reviewed the patients History and Physical, chart, labs and discussed the procedure including the risks, benefits and alternatives for the proposed anesthesia with the patient or authorized representative who has indicated his/her understanding and acceptance.     Dental advisory given  Plan Discussed with: CRNA  Anesthesia Plan Comments:          Anesthesia Quick Evaluation

## 2024-01-02 NOTE — Progress Notes (Signed)
 Pt started pulling out tele leads and says he needs to go back  home to take care of animals,RN tried to explain about the discharge process but pt refuse to listen to the nurse,MD notified,Discharge order placed ,ortho tech called for post op shoe and shoe placed ,iv removed,discharge instruction given,pt verbalized understanding pt requested a ride till home ,discharge lounge notified about the requirement of ride and [facility] medicine
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
## 2024-01-02 NOTE — Progress Notes (Signed)
 Orthopedic Tech Progress Note Patient Details:  [name] 1955/09/16 [id number]  Ortho Devices Type of Ortho Device: Postop shoe/boot Ortho Device/Splint Location: LLE Ortho Device/Splint Interventions: Ordered, Application, Adjustment   Post Interventions Patient Tolerated: Fair Instructions Provided: Care of device  [name] 01/02/2024, 11:32 AM

## 2024-01-03 ENCOUNTER — Encounter: Payer: Self-pay | Admitting: Internal Medicine

## 2024-01-03 ENCOUNTER — Encounter (HOSPITAL_COMMUNITY): Payer: Self-pay | Admitting: Podiatry

## 2024-01-03 LAB — CULTURE, BLOOD (ROUTINE X 2)
Culture: NO GROWTH
Culture: NO GROWTH
Special Requests: ADEQUATE

## 2024-01-05 ENCOUNTER — Telehealth: Payer: Self-pay | Admitting: Internal Medicine

## 2024-01-05 ENCOUNTER — Telehealth: Payer: Self-pay | Admitting: *Deleted

## 2024-01-05 ENCOUNTER — Ambulatory Visit: Payer: Medicare Other | Admitting: Internal Medicine

## 2024-01-05 ENCOUNTER — Other Ambulatory Visit: Payer: Self-pay | Admitting: Internal Medicine

## 2024-01-05 NOTE — Transitions of Care (Post Inpatient/ED Visit) (Signed)
   01/05/2024  Name: [name] MRN: [id number] DOB: 1956/06/24  Today's TOC FU Call Status: Today's TOC FU Call Status:: Unsuccessful Call (1st Attempt) Unsuccessful Call (1st Attempt) Date: 01/05/24  Attempted to reach the patient regarding the most recent Inpatient visit; left HIPAA compliant voice message requesting call back  Follow Up Plan: Additional outreach attempts will be made to reach the patient to complete the Transitions of Care (Post Inpatient visit) call.   Pls call/ message for questions,  Tracker Mance Mckinney Maurisha Mongeau, RN, BSN, CCRN Alumnus RN Care Manager  Transitions of Care  [facility] - [facility] [phone]: direct office

## 2024-01-05 NOTE — Progress Notes (Deleted)
 Name: [name]  Age/ Sex: 68 y.o., male   MRN/ DOB: [id number], 06-Jul-1956     PCP: Roslyn Coombe, MD   Reason for Endocrinology Evaluation: Type 2 Diabetes Mellitus  Initial Endocrine Consultative Visit: 09/06/2012    PATIENT IDENTIFIER: [name] is a 68 y.o. male with a past medical history of T2DM, HTN and Hx of bladder cancer and CAD. The patient has followed with Endocrinology clinic since 09/06/2012 for consultative assistance with management of his diabetes.  DIABETIC HISTORY:  [name] was diagnosed with DM 1992.  He used to be on Metformin - diarrhea. His hemoglobin A1c has ranged from 6.9% in 2013, peaking at 10.9% in 2021  He attributes bladder cancer to actos    SUBJECTIVE:   During the last visit (07/07/2023): A1c 7.2%     Today (01/05/2024): [name]  is here for a follow up on diabetes management .He checks his blood sugars 1 times daily. The patient has had hypoglycemic episodes since the last clinic visit.  Patient is symptomatic with these episodes.   Pt presented with sepsis due to cellulitis 12/2023 Pt is S/P bypass of the left SFA and stent placement , left 5th toe amputations  12/2023  He is on pt assistance with Toujeo  and Ozempic    Patient continues to follow-up with cardiology for peripheral artery disease, CAD, paroxysmal SVT, HTN and dyslipidemia     Denies nausea or vomiting Denies constipation or diarrhea   HOME DIABETES REGIMEN:  Toujeo  100 units daily  Ozempic  1 mg weekly ( Monday )      Statin: yes ACE-I/ARB: yes    METER DOWNLOAD SUMMARY: Unable to download [phone]     DIABETIC COMPLICATIONS: Microvascular complications:  Neuropathy Denies: CKD  Last Eye Exam: Completed 06/17/2023  Macrovascular complications:  CAD, PVD Denies: CVA   HISTORY:  Past Medical History:  Past Medical History:  Diagnosis Date   Adenomatous colon polyp    Allergic rhinitis    At risk for sleep apnea    STOP-BANG= 5   SENT TO  PCP 03-14-2014   Bladder cancer (HCC)    CAD (coronary artery disease)    a. 07/2014 low risk MV; b. 07/2019 Cor CTA (FFR): LM 25-49 (nl), LAD mild prox/mid plaque (nl), D1 25-49p(nl w/ abnl FFR of 0.73 in inf branch), LCX/OM1 mild prox/mid plaque (nl), RCA nondominant, minimal Ca2+ plaque (nl), RPDA (mildly abnl @ 0.79)-->Med Rx..   Cataract    surgically removed bilateral   Condyloma acuminatum of penis    Diabetic neuropathy (HCC)    Diastolic dysfunction    a. 05/2019 Echo: EF 60-65%, no rwam, mod LVH, impaired relaxation, nl RV size/fxn, trace MR, triv TR.   Diverticulosis    GERD (gastroesophageal reflux disease)    History of bladder cancer    s/p  turbt  2013/   transitional cell carcinoma--    History of condyloma acuminatum    PERINEAL AREA  W/ RECURRENCY   History of gout    Hyperlipidemia    Hypertension    Lower urinary tract symptoms (LUTS)    PAF (paroxysmal atrial fibrillation) (HCC)    a. 06/2019 Event monitor: PAF <1% burden. Longest 3 mins 36 secs.   Productive cough    PSVT (paroxysmal supraventricular tachycardia) (HCC)    a. 06/2019 Event monitor: 112 episodes of SVT, longest 21 secs.   PVD (peripheral vascular disease) with claudication (HCC)    a. 03/2014 LE art duplex: long segment  occlusion of mid to distal R SFA; b. 03/2020 ABI: nl left and mildly improved R ABI->med rx.   Renal artery stenosis (HCC)    Renal artery stenosis (HCC)    a. 12/2020 Renal art duplex: RRA 1-59%, LRA >60%.   Smokers' cough (HCC)    Type 2 diabetes mellitus with insulin  therapy (HCC) 1992   monitor by  dr ellsion   Wears dentures    upper   Past Surgical History:  Past Surgical History:  Procedure Laterality Date   AMPUTATION Left 01/02/2024   Procedure: AMPUTATION, FOOT, RAY;  Surgeon: Evertt Hoe, DPM;  Location: [facility];  Service: Orthopedics/Podiatry;  Laterality: Left;  FIFTH TOE   AXILLARY HIDRADENITIS EXCISION  1997   CARDIOVASCULAR STRESS TEST  07-24-2014  dr  Antionette Kirks   Low risk lexiscan  nuclear study with apical thinning and small inferolateral wall infarct at mid & basal level , no ischemia/  normal LVF and wall motion , ef 59%   CATARACT EXTRACTION Left    CATARACT EXTRACTION W/ INTRAOCULAR LENS IMPLANT Right    CO2 LASER APPLICATION N/A 03/20/2014   Procedure: CO2 LASER APPLICATION,PENIS, GROIN, ANUS;  Surgeon: Eddye Goodie, MD;  Location: [facility];  Service: General;  Laterality: N/A;   CO2 LASER APPLICATION N/A 05/21/2015   Procedure: CO2 LASER APPLICATION;  Surgeon: Mark Ottelin, MD;  Location[facility];  Service: Urology;  Laterality: N/A;   CONDYLOMA EXCISION/FULGURATION N/A 05/21/2015   Procedure: CONDYLOMA REMOVAL;  Surgeon: Mark Ottelin, MD;  Location: [facility];  Service: Urology;  Laterality: N/A;   HEMORRHOID SURGERY  10/24/2014   Procedure: HEMORRHOIDECTOMY;  Surgeon: Candyce Champagne, MD;  Location: [facility];  Service: General;;   INCISION AND DRAINAGE ABSCESS Left 10/24/2014   Procedure: INCISION AND DRAINAGE ABSCESS;  Surgeon: Candyce Champagne, MD;  Location: [facility];  Service: General;  Laterality: Left;   INGUINAL HIDRADENITIS EXCISION  1998, 1999   LASER ABLATION CONDOLAMATA N/A 03/20/2014   Procedure: EXAM UNDER ANESTHESIA, REMOVAL/ABLATION OF CONDYLOMATA PENIS,GROINS, ANUS, ANAL CANAL;  Surgeon: Eddye Goodie, MD;  Location: [facility];  Service: General;  Laterality: N/A;  groin and anus   LASER ABLATION CONDOLAMATA N/A 10/24/2014   Procedure: LASER ABLATION CONDOLAMATA;  Surgeon: Candyce Champagne, MD;  Location: [facility];  Service: General;  Laterality: N/A;   LASER ABLATION OF PENILE AND PERIANAL WARTS  07-29-2007  Dr. Hershell Lose   LEFT SHOULDER SURGERY  2003   LOWER EXTREMITY ANGIOGRAPHY N/A 01/01/2024   Procedure: Lower Extremity Angiography;  Surgeon: Adine Hoof, MD;  Location: [facility] INVASIVE CV  LAB;  Service: Vascular;  Laterality: N/A;   LOWER EXTREMITY INTERVENTION  01/01/2024   Procedure: LOWER EXTREMITY INTERVENTION;  Surgeon: Adine Hoof, MD;  Location: [facility] INVASIVE CV LAB;  Service: Vascular;;   MASS EXCISION N/A 10/24/2014   Procedure: EXCISION OF PERINEAL MASS/SINUS;  Surgeon: Candyce Champagne, MD;  Location: [facility];  Service: General;  Laterality: N/A;   MOHS SURGERY     back   MOHS SURGERY  2017   face   MULTIPLE TOOTH EXTRACTIONS     PERINEAL HIDRADENITIS EXCISION  1998, 1999   TRANSURETHRAL RESECTION OF BLADDER TUMOR  05/21/2012   Procedure: TRANSURETHRAL RESECTION OF BLADDER TUMOR (TURBT);  Surgeon: Mark C Ottelin, MD;  Location: [facility];  Service: Urology;  Laterality: N/A;      Social History:  reports that he has been smoking cigars and cigarettes. He started smoking about 49 years ago. He has a 41 pack-year smoking history. He has never used smokeless tobacco. He reports that he does not drink alcohol and does not use drugs. Family History:  Family History  Problem Relation Age of Onset   Hypertension Mother    Lung cancer Father    Diabetes Maternal Aunt        x 2   Colon cancer Neg Hx    Esophageal cancer Neg Hx    Pancreatic cancer Neg Hx    Prostate cancer Neg Hx    Kidney disease Neg Hx    Liver disease Neg Hx    Rectal cancer Neg Hx    Stomach cancer Neg Hx      HOME MEDICATIONS: Allergies as of 01/05/2024       Reactions   Cefepime  Hives   Had allergic reaction to one of these 3 agents, unclear which one   Metoprolol  Hives   Had allergic reaction to one of these 3 agents, unclear which one *Per RN, highly likely this is the cause of allergic rxn   Myrbetriq  [mirabegron ] Hives   Had allergic reaction to one of these 3 agents, unclear which one        Medication List        Accurate as of Jan 05, 2024 11:07 AM. If you have any questions, ask your nurse or doctor.           amoxicillin-clavulanate 875-125 MG tablet Commonly known as: AUGMENTIN Take 1 tablet by mouth 2 (two) times daily for 7 days.   aspirin  EC 81 MG tablet Take 1 tablet (81 mg total) by mouth daily.   atorvastatin  40 MG tablet Commonly known as: LIPITOR Take 1 tablet by mouth once daily   citalopram  20 MG tablet Commonly known as: CELEXA  Take 1 tablet by mouth once daily   clopidogrel 75 MG tablet Commonly known as: PLAVIX Take 1 tablet (75 mg total) by mouth daily with breakfast.   irbesartan  300 MG tablet Commonly known as: AVAPRO  TAKE 1 TABLET BY MOUTH DAILY   loratadine  10 MG tablet Commonly known as: CLARITIN  Take 10 mg by mouth daily as needed.   ONE TOUCH ULTRA 2 w/Device Kit Use as directed to check blood sugars twice a day   OneTouch Delica Plus Lancet30G Misc CHECK BLOOD SUGAR TWICE DAILY   OneTouch Ultra test strip Generic drug: glucose blood CHECK BLOOD SUGAR TWICE DAILY   oxyCODONE  5 MG immediate release tablet Commonly known as: Oxy IR/ROXICODONE  Take 1 tablet (5 mg total) by mouth every 4 (four) hours as needed for moderate pain (pain score 4-6).   Ozempic  (0.25 or 0.5 MG/DOSE) 2 MG/3ML Sopn Generic drug: Semaglutide (0.25 or 0.5MG /DOS) Inject 0.5 mg into the skin once a week.   pantoprazole  40 MG tablet Commonly known as: PROTONIX  TAKE 1 TABLET BY MOUTH DAILY   Toujeo  Max SoloStar 300 UNIT/ML Solostar Pen Generic drug: insulin  glargine (2 Unit Dial) Inject 100 Units into the skin every morning.   VITAMIN D  (CHOLECALCIFEROL) PO Take 5,000 Units by mouth daily.         OBJECTIVE:   Vital Signs: There were no vitals taken for this visit.  Wt Readings from Last 3 Encounters:  01/02/24 140 lb 14 oz (63.9 kg)  07/07/23 207 lb (93.9 kg)  01/13/23 215 lb (97.5 kg)     Exam: General: Pt appears well and is in  NAD  Lungs: Clear with good BS bilat   Heart: RRR  Extremities: No pretibial edema.   Neuro: MS is good with appropriate  affect, pt is alert and Ox3    DM Foot Exam 04/10/2023 per podiatry     DATA REVIEWED:  Lab Results  Component Value Date   HGBA1C 14.3 (H) 12/31/2023   HGBA1C 7.2 (A) 07/07/2023   HGBA1C 8.6 (H) 01/13/2023    Latest Reference Range & Units 01/13/23 14:14  Sodium 135 - 145 mEq/L 139  Potassium 3.5 - 5.1 mEq/L 4.0  Chloride 96 - 112 mEq/L 99  CO2 19 - 32 mEq/L 30  Glucose 70 - 99 mg/dL 161 (H)  BUN 6 - 23 mg/dL 19  Creatinine 0.96 - 0.45 mg/dL 4.09  Calcium  8.4 - 10.5 mg/dL 9.5  Alkaline Phosphatase 39 - 117 U/L 78  Albumin 3.5 - 5.2 g/dL 4.2  AST 0 - 37 U/L 21  ALT 0 - 53 U/L 28  Total Protein 6.0 - 8.3 g/dL 7.6  Bilirubin, Direct 0.0 - 0.3 mg/dL 0.2  Total Bilirubin 0.2 - 1.2 mg/dL 0.7  GFR >81.19 mL/min 65.74    Latest Reference Range & Units 01/13/23 14:14  Total CHOL/HDL Ratio  3  Cholesterol 0 - 200 mg/dL 97  HDL Cholesterol >14.78 mg/dL 29.56 (L)  LDL (calc) 0 - 99 mg/dL 41  MICROALB/CREAT RATIO 0.0 - 30.0 mg/g 0.9  NonHDL  66.68  Triglycerides 0.0 - 149.0 mg/dL 213.0  VLDL 0.0 - 86.5 mg/dL 78.4  (L): Data is abnormally low  Old records , labs and images have been reviewed.    ASSESSMENT / PLAN / RECOMMENDATIONS:   1) Type 2 Diabetes Mellitus, optimally controlled, With macrovascular and neuropathic complications - Most recent A1c of 7.2 %. Goal A1c < 7.0 %.    -A1c has trended down from 8.2% -He continues to have occasional hypoglycemia, will reduce insulin  -Patient on patient assistance program for Toujeo  and Ozempic  -He is intolerant to Victoza  1.8 mg -Intolerant to metformin  -Patient attributes bladder cancer to pioglitazone use -Not a candidate for SGLT2 inhibitors at this time due to history of bladder cancer and increased risk of infection    MEDICATIONS: Decrease Toujeo  to 100 units daily Continue Ozempic  1 mg weekly  EDUCATION / INSTRUCTIONS: BG monitoring instructions: Patient is instructed to check his blood sugars 1 times a day,  fasting. Call [facility] if: BG persistently < 70 I reviewed the Rule of 15 for the treatment of hypoglycemia in detail with the patient. Literature supplied.   2) Diabetic complications:  Eye: Does not have known diabetic retinopathy.  Neuro/ Feet: Does not have known diabetic peripheral neuropathy .  Renal: Patient does not have known baseline CKD. He   is  on an ACEI/ARB at present.      F/U in 6 months     Signed electronically by: Natale Bail, MD  [facility]  [facility] [address] [place], [place] [address] Phone: [phone] FAX: [phone]   CC: Roslyn Coombe, MD [address] [place] [address] Phone: [phone]  Fax: [phone]  Return to Endocrinology clinic as below: Future Appointments  Date Time Provider Department Center  01/05/2024  2:40 PM Oveta Idris, Julian Obey, MD [facility] None  02/10/2024  1:30 PM [facility]  02/10/2024  2:00 PM [facility]  02/10/2024  2:30 PM [facility] H[facility]V

## 2024-01-05 NOTE — Telephone Encounter (Signed)
 Copied from CRM [id number]. Topic: General - Other >> Jan 05, 2024 11:42 AM Chuck Crater wrote: Reason for CRM: [name] [facility] Social Services Is requiring a medical letter from doctor on letterhead that patient will bebefit from someone assisitjg  him with managing his fianaces. She states that she needs this faxed over so she can send to [employer] along with the application. Fax number: [phone]

## 2024-01-06 ENCOUNTER — Telehealth: Payer: Self-pay | Admitting: *Deleted

## 2024-01-06 LAB — SURGICAL PATHOLOGY

## 2024-01-06 NOTE — Telephone Encounter (Signed)
 Needs ROV as I have not seen pt since June 2024, and would not be comfortable with this letter requested ,  thanks

## 2024-01-06 NOTE — Transitions of Care (Post Inpatient/ED Visit) (Signed)
 01/06/2024  Name: Paul NOTEBOOM MRN: [id number] DOB: 1956/05/25  Today's TOC FU Call Status: Today's TOC FU Call Status:: Successful TOC FU Call Completed TOC FU Call Complete Date: 01/06/24 Patient's Name and Date of Birth confirmed.  Transition Care Management Follow-up Telephone Call Date of Discharge: 01/02/24 Discharge Facility: [facility]) Type of Discharge: Inpatient Admission Primary Inpatient Discharge Diagnosis:: Sepsis secondary to cellulitis; (L) 5th toe osteomyelitis with surgical amputation How have you been since you were released from the hospital?: Better (patient yelling loudly: "I am fine, I'[name] on a mission to save my property!  I am driving around in a car with no tags no liscense- a cop is behind me!  I have not heard from Dr. Autry Legions in a long time, can't talk right now and you don;t need to call me back") Any questions or concerns?: Yes Patient Questions/Concerns:: Patient is yelling loudly into phone from outset of TOC call- he does not want to listen to the reason I am calling him; he is yelling that he is on a "mission" to save his property and that he is in a car with no tags/ no liscense plate, "because Dr. Autry Legions would not write a letter for me to keep my car insurance back in November;"  (unable to verify from review of EHR)- noted that Dr. Autry Legions made entry into EHR yesterday, stating that he has not seen patient in almost one full year- patient needs to schedule PCP office visit: attempted to assist/ facilitate scheduling hospital follow up visit- patient declined; he declined my offer to re-attempt contact with him tomorrow; states "too much is going on;" Encouraged patient to schedule promptly with Dr. Autry Legions if he would like to continue with him as his PCP- patient agreeable and then hangs up phone Patient Questions/Concerns Addressed: Other: (see narrative above: patient declined full TOC; encouraged patient to promptly schedule with PCP if he wishes to continue with  Dr. Autry Legions as PCP)  Items Reviewed: Did you receive and understand the discharge instructions provided?:  (unable to determine- patient declined full TOC call and decines additional call to him at later time) Medications obtained,verified, and reconciled?: No Medications Not Reviewed Reasons:: Other: (unable to determine- patient declined full TOC call and decines additional call to him at later time) Any new allergies since your discharge?:  (unable to determine- patient declined full TOC call and decines additional call to him at later time) Dietary orders reviewed?:  (unable to determine- patient declined full TOC call and decines additional call to him at later time) Do you have support at home?:  (unable to determine- patient declined full TOC call and decines additional call to him at later time)  Medications Reviewed Today: Medications Reviewed Today     Reviewed by Paul Deshazo M, RN (Registered Nurse) on 01/06/24 at 1540  Med List Status: <None>   Medication Order Taking? Sig Documenting Provider Last Dose Status Informant  amoxicillin-clavulanate (AUGMENTIN) 875-125 MG tablet [id number]  Take 1 tablet by mouth 2 (two) times daily for 7 days. Maylene Spear, MD  Active   aspirin  EC 81 MG tablet [id number] No Take 1 tablet (81 mg total) by mouth daily. Wenona Hamilton, MD 12/28/2023 Active Self, Pharmacy Records  atorvastatin  (LIPITOR) 40 MG tablet [id number] No Take 1 tablet by mouth once daily  Patient not taking: Reported on 12/29/2023   Roslyn Coombe, MD Not Taking Active Self, Pharmacy Records  Blood Glucose Monitoring Suppl (ONE TOUCH ULTRA 2) w/Device KIT  [id number] No Use as directed to check blood sugars twice a day Roslyn Coombe, MD Taking Active Self, Pharmacy Records  citalopram  (CELEXA ) 20 MG tablet [id number] No Take 1 tablet by mouth once daily  Patient not taking: Reported on 12/29/2023   Roslyn Coombe, MD Not Taking Active Self, Pharmacy Records  clopidogrel (PLAVIX) 75 MG  tablet [id number]  Take 1 tablet (75 mg total) by mouth daily with breakfast. Maylene Spear, MD  Active   insulin  glargine, 2 Unit Dial, (TOUJEO  MAX SOLOSTAR) 300 UNIT/ML Solostar Pen [id number]  Inject 100 Units into the skin every morning. Krishnan, Gokul, MD  Active   irbesartan  (AVAPRO ) 300 MG tablet [id number] No TAKE 1 TABLET BY MOUTH DAILY Roslyn Coombe, MD 12/28/2023 Active Self, Pharmacy Records  Lancets [facility] Jewelene Morton PLUS [place]) MISC [id number]  CHECK BLOOD SUGAR TWICE DAILY Roslyn Coombe, MD  Active Self, Pharmacy Records  loratadine  (CLARITIN ) 10 MG tablet [id number] No Take 10 mg by mouth daily as needed. [provider] Unknown Active Self, Pharmacy Records  [facility] ULTRA test strip [id number] No CHECK BLOOD SUGAR TWICE DAILY Roslyn Coombe, MD Taking Active Self, Pharmacy Records  oxyCODONE  (OXY IR/ROXICODONE ) 5 MG immediate release tablet [id number]  Take 1 tablet (5 mg total) by mouth every 4 (four) hours as needed for moderate pain (pain score 4-6). Krishnan, Gokul, MD  Active   pantoprazole  (PROTONIX ) 40 MG tablet [id number] No TAKE 1 TABLET BY MOUTH DAILY Roslyn Coombe, MD 12/28/2023 Active Self, Pharmacy Records  Semaglutide ,0.25 or 0.5MG /DOS, (OZEMPIC , 0.25 OR 0.5 MG/DOSE,) 2 MG/3ML SOPN [id number] No Inject 0.5 mg into the skin once a week.  Patient not taking: Reported on 12/29/2023   Shamleffer, Julian Obey, MD Not Taking Active Self, Pharmacy Records  VITAMIN D , CHOLECALCIFEROL, PO [id number] No Take 5,000 Units by mouth daily. [provider] 12/28/2023 Active Self, Pharmacy Records           Home Care and Equipment/Supplies: Were Home Health Services Ordered?:  (unable to determine- patient declined full TOC call and decines additional call to him at later time) Any new equipment or medical supplies ordered?:  (unable to determine- patient declined full TOC call and decines additional call to him at later time)  Functional Questionnaire: Do you need  assistance with bathing/showering or dressing?:  (unable to determine- patient declined full TOC call and decines additional call to him at later time) Do you need assistance with meal preparation?:  (unable to determine- patient declined full TOC call and decines additional call to him at later time) Do you need assistance with eating?:  (unable to determine- patient declined full TOC call and decines additional call to him at later time) Do you have difficulty maintaining continence:  (unable to determine- patient declined full TOC call and decines additional call to him at later time) Do you need assistance with getting out of bed/getting out of a chair/moving?:  (unable to determine- patient declined full TOC call and decines additional call to him at later time) Do you have difficulty managing or taking your medications?:  (unable to determine- patient declined full TOC call and decines additional call to him at later time)  Follow up appointments reviewed: PCP Follow-up appointment confirmed?: No (patient declined my offer to facilitate scheduling hospital follow up office visit with PCP) MD Provider Line Number:[phone] Given: No (unable to determine- patient declined full TOC call and decines additional call to him at later time) Specialist Hospital Follow-up appointment confirmed?:  (  unable to determine- patient declined full TOC call and decines additional call to him at later time) Do you need transportation to your follow-up appointment?:  (unable to determine- patient declined full TOC call and decines additional call to him at later time) Do you understand care options if your condition(s) worsen?:  (unable to determine- patient declined full TOC call and decines additional call to him at later time)  SDOH Interventions Today    Flowsheet Row Most Recent Value  SDOH Interventions   Food Insecurity Interventions Patient Declined  Housing Interventions Patient Declined   Transportation Interventions Patient Declined  Utilities Interventions Patient Declined      Patient declines full TOC call/ need for ongoing/ further care management/ coordination outreach; declines enrollment in 30-day TOC program- declines taking my direct phone number should needs/ concerns arise post-TOC call   See TOC assessment tabs for additional assessment/ TOC intervention information  Pls call/ message for questions,  Kimorah Ridolfi Mckinney Achilles Neville, RN, BSN, Media planner  Transitions of Care  [facility] - [facility] [phone]: direct office

## 2024-01-07 ENCOUNTER — Ambulatory Visit (INDEPENDENT_AMBULATORY_CARE_PROVIDER_SITE_OTHER): Admitting: Podiatry

## 2024-01-07 DIAGNOSIS — M869 Osteomyelitis, unspecified: Secondary | ICD-10-CM

## 2024-01-07 DIAGNOSIS — Z9889 Other specified postprocedural states: Secondary | ICD-10-CM | POA: Diagnosis not present

## 2024-01-07 LAB — AEROBIC/ANAEROBIC CULTURE W GRAM STAIN (SURGICAL/DEEP WOUND)

## 2024-01-07 MED ORDER — OXYCODONE-ACETAMINOPHEN 5-325 MG PO TABS: 1.0000 | ORAL_TABLET | ORAL | 0 refills | Status: AC | PRN

## 2024-01-07 NOTE — Progress Notes (Signed)
  Subjective:  Patient ID: [name], male    DOB: Jan 20, 1956,  MRN: [id number]  Chief Complaint  Patient presents with   Post-op Follow-up    Left 5th toe amp. Decrease in pain level since surgery. Discharged on Saturday. Has changed bandage since surgery at home, covered with a bandaid.     DOS: 01/02/2024 Procedure: 1. Amputation of fifth toe at Mpj level, Left foot   68 y.o. male seen for post op check.  Patient seen for first postop check approximately 5 days after surgery.  Patient changed his dressing against recommendations has been wearing postop shoe.  Review of Systems: Negative except as noted in the HPI. Denies N/V/F/Ch.   Objective:   Constitutional Well developed. Well nourished.  Vascular Foot warm and well perfused. Capillary refill normal to all digits.   No calf pain with palpation  Neurologic Normal speech. Oriented to person, place, and time. Epicritic sensation diminished to forefoot level  Dermatologic Amputation site well coapted without evidence of dehiscence erythema or drainage  Orthopedic: Status post left fifth toe amputation   Radiographs: Disarticulation of the fifth toe at MPJ level  Pathology: A. TOE, LEFT FIFTH, AMPUTATION:  Ulcer with acute inflammation and necrosis.  Acute osteomyelitis.  Proximal margin bone negative for inflammation.   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  POD # 5 s/p left fifth toe amputation and MPJ level -Progressing as operatively -XR: Expected postoperative changes -WB Status: Weightbearing as tolerated in postop shoe -Sutures: Remain intact 2 to 3 weeks. -Medications/ABX: Finished course of p.o. antibiotics in hospital and then discontinue -Dressing: Changed today applied new Xeroform  gauze dressing leave this intact until he comes back - F/u Plan: Follow-up in 1 week        Maridee Shoemaker, DPM [facility]

## 2024-01-21 ENCOUNTER — Telehealth: Payer: Self-pay | Admitting: Podiatry

## 2024-01-21 ENCOUNTER — Ambulatory Visit (INDEPENDENT_AMBULATORY_CARE_PROVIDER_SITE_OTHER): Admitting: Podiatry

## 2024-01-21 DIAGNOSIS — Z91199 Patient's noncompliance with other medical treatment and regimen due to unspecified reason: Secondary | ICD-10-CM

## 2024-01-21 NOTE — Progress Notes (Signed)
 NS

## 2024-01-21 NOTE — Telephone Encounter (Signed)
 Left message for pt to call to r/s his missed his appt this morning.

## 2024-01-21 NOTE — Telephone Encounter (Signed)
 The patient missed his appointment this morning, where he was supposed to have stitches removed. Should we reschedule with you or with another provider?  He reports that his foot is still swollen but has improved by about 50%. He finished his last dose of antibiotics yesterday. He is also requesting a refill for both the antibiotics and his pain medication.

## 2024-01-26 ENCOUNTER — Ambulatory Visit (INDEPENDENT_AMBULATORY_CARE_PROVIDER_SITE_OTHER): Admitting: Podiatry

## 2024-01-26 VITALS — BP 152/62 | HR 75 | Temp 100.0°F | Resp 18

## 2024-01-26 DIAGNOSIS — M869 Osteomyelitis, unspecified: Secondary | ICD-10-CM | POA: Diagnosis not present

## 2024-01-26 DIAGNOSIS — M79674 Pain in right toe(s): Secondary | ICD-10-CM | POA: Diagnosis not present

## 2024-01-26 DIAGNOSIS — B351 Tinea unguium: Secondary | ICD-10-CM

## 2024-01-26 DIAGNOSIS — Z9889 Other specified postprocedural states: Secondary | ICD-10-CM

## 2024-01-26 DIAGNOSIS — M79675 Pain in left toe(s): Secondary | ICD-10-CM | POA: Diagnosis not present

## 2024-01-26 NOTE — Progress Notes (Signed)
  Subjective:  Patient ID: [name], male    DOB: Feb 14, 1956,  MRN: [id number]  Chief Complaint  Patient presents with   Post-op Follow-up    Denies pain. Sutures intact. Patient was off balance walking back to exam room. Very pale and diaphoretic in appearance. Vital signs checked. Reports diarrhea all day.     DOS: 01/02/2024 Procedure: 1. Amputation of fifth toe at Mpj level, Left foot   68 y.o. male seen for post op check.   Following up approximately 3.5 weeks from surgery.  Patient reports he is doing well does report diarrhea recently.  He denies pain in the left foot says he has been washing his left foot and feels it is healing well.  Review of Systems: Negative except as noted in the HPI. Denies N/V/F/Ch.   Objective:   Constitutional Well developed. Well nourished.  Vascular Foot warm and well perfused. Capillary refill normal to all digits.   No calf pain with palpation  Neurologic Normal speech. Oriented to person, place, and time. Epicritic sensation diminished to forefoot level  Dermatologic Amputation site well coapted and well-healed without evidence of dehiscence erythema or drainage.  Thickened elongated dystrophic x 9   Orthopedic: Status post left fifth toe amputation   Radiographs: Disarticulation of the fifth toe at MPJ level  Pathology: A. TOE, LEFT FIFTH, AMPUTATION:  Ulcer with acute inflammation and necrosis.  Acute osteomyelitis.  Proximal margin bone negative for inflammation.   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  3.5 weeks s/p left fifth toe amputation and MPJ level -Progressing as operatively  -XR: Expected postoperative changes -WB Status: Weightbearing as tolerated in postop shoe -Sutures: Removed in total  today. -Medications/ABX: No further antibiotics indicated -Dressing: Recommend Band-Aid and Betadine dressing for another week - F/u Plan: Follow-up as needed for the toe  #Onychomycosis with pain  -Nails palliatively debrided as below. -Educated on self-care  Procedure: Nail Debridement Rationale: Pain Type of Debridement: manual, sharp debridement. Instrumentation: Nail nipper, rotary burr. Number of Nails: 9        Maridee Shoemaker, DPM [facility] / [facility]

## 2024-01-28 ENCOUNTER — Other Ambulatory Visit: Payer: Self-pay | Admitting: Vascular Surgery

## 2024-01-28 DIAGNOSIS — I700 Atherosclerosis of aorta: Secondary | ICD-10-CM

## 2024-01-28 DIAGNOSIS — I739 Peripheral vascular disease, unspecified: Secondary | ICD-10-CM

## 2024-02-07 ENCOUNTER — Other Ambulatory Visit: Payer: Self-pay | Admitting: Internal Medicine

## 2024-02-08 ENCOUNTER — Other Ambulatory Visit: Payer: Self-pay

## 2024-02-10 ENCOUNTER — Ambulatory Visit (HOSPITAL_COMMUNITY)

## 2024-02-10 ENCOUNTER — Other Ambulatory Visit: Payer: Self-pay | Admitting: *Deleted

## 2024-02-10 ENCOUNTER — Ambulatory Visit (HOSPITAL_COMMUNITY)
Admission: RE | Admit: 2024-02-10 | Discharge: 2024-02-10 | Disposition: A | Discharge status: Home / Self Care | Source: Ambulatory Visit | Attending: Vascular Surgery | Admitting: Vascular Surgery

## 2024-02-10 ENCOUNTER — Other Ambulatory Visit (HOSPITAL_COMMUNITY): Payer: Self-pay

## 2024-02-10 ENCOUNTER — Ambulatory Visit (HOSPITAL_BASED_OUTPATIENT_CLINIC_OR_DEPARTMENT_OTHER)
Admission: RE | Admit: 2024-02-10 | Discharge: 2024-02-10 | Disposition: A | Discharge status: Home / Self Care | Source: Ambulatory Visit | Attending: Vascular Surgery | Admitting: Vascular Surgery

## 2024-02-10 ENCOUNTER — Other Ambulatory Visit: Payer: Self-pay

## 2024-02-10 ENCOUNTER — Encounter: Payer: Self-pay | Admitting: Physician Assistant

## 2024-02-10 VITALS — BP 157/74 | HR 87 | Temp 98.2°F | Ht 67.99 in | Wt 140.0 lb

## 2024-02-10 DIAGNOSIS — T82599A Other mechanical complication of unspecified cardiac and vascular devices and implants, initial encounter: Secondary | ICD-10-CM | POA: Diagnosis not present

## 2024-02-10 DIAGNOSIS — I70262 Atherosclerosis of native arteries of extremities with gangrene, left leg: Secondary | ICD-10-CM | POA: Diagnosis not present

## 2024-02-10 DIAGNOSIS — I70235 Atherosclerosis of native arteries of right leg with ulceration of other part of foot: Secondary | ICD-10-CM | POA: Diagnosis not present

## 2024-02-10 DIAGNOSIS — E08610 Diabetes mellitus due to underlying condition with diabetic neuropathic arthropathy: Secondary | ICD-10-CM

## 2024-02-10 DIAGNOSIS — I739 Peripheral vascular disease, unspecified: Secondary | ICD-10-CM

## 2024-02-10 DIAGNOSIS — I700 Atherosclerosis of aorta: Secondary | ICD-10-CM

## 2024-02-10 DIAGNOSIS — I70222 Atherosclerosis of native arteries of extremities with rest pain, left leg: Secondary | ICD-10-CM

## 2024-02-10 DIAGNOSIS — E119 Type 2 diabetes mellitus without complications: Secondary | ICD-10-CM

## 2024-02-10 LAB — VAS US ABI WITH/WO TBI
Left ABI: 0.32
Right ABI: 0.5

## 2024-02-10 MED ORDER — CIPROFLOXACIN HCL 500 MG PO TABS
500.0000 mg | ORAL_TABLET | Freq: Two times a day (BID) | ORAL | 0 refills | Status: DC
  Filled 2024-02-10: qty 20, 10d supply, fill #0

## 2024-02-10 MED ORDER — CLOPIDOGREL BISULFATE 75 MG PO TABS
75.0000 mg | ORAL_TABLET | Freq: Every day | ORAL | 11 refills | Status: DC
Start: 2024-02-10 — End: 2024-02-16
  Filled 2024-02-10: qty 30, 30d supply, fill #0

## 2024-02-11 ENCOUNTER — Ambulatory Visit: Attending: Cardiovascular Disease | Admitting: Cardiovascular Disease

## 2024-02-11 ENCOUNTER — Telehealth: Payer: Self-pay | Admitting: Licensed Clinical Social Worker

## 2024-02-11 NOTE — Telephone Encounter (Signed)
 H&V Care Navigation CSW Progress Note  Clinical Social Worker contacted patient by phone to f/u on referral with multiple SDOH needs. No answer today, [phone]. Note that pt also missed appt with [facility] this morning. Left voicemail. Will re-attempt again as able.  Patient is participating in a Managed Medicaid Plan:  No, [facility] Medicare   SDOH Screenings   Food Insecurity: Patient Declined (01/06/2024)  Recent Concern: Food Insecurity - Food Insecurity Present (12/30/2023)  Housing: Patient Declined (01/06/2024)  Recent Concern: Housing - High Risk (12/30/2023)  Transportation Needs: Patient Declined (01/06/2024)  Recent Concern: Transportation Needs - Unmet Transportation Needs (12/30/2023)  Utilities: Patient Declined (01/06/2024)  Recent Concern: Utilities - At Risk (12/31/2023)  Alcohol Screen: Low Risk  (06/10/2023)  Depression (PHQ2-9): Low Risk  (06/10/2023)  Financial Resource Strain: Low Risk  (06/10/2023)  Physical Activity: Inactive (06/10/2023)  Social Connections: Socially Isolated (12/30/2023)  Stress: No Stress Concern Present (06/10/2023)  Tobacco Use: High Risk (02/10/2024)  Health Literacy: Adequate Health Literacy (06/10/2023)    [name], MSW, LCSW Clinical Social Worker II [name] Heart/Vascular Care Navigation  [phone]- work cell phone (preferred)

## 2024-02-11 NOTE — H&P (View-Only) (Signed)
 Office Note   History of Present Illness   [name] is a 68 y.o. ([birth year]) male who presents for post angio follow-up.  He recently underwent left lower extremity angiogram with left SFA stenting on 01/01/2024 by Dr. Sheree.  He subsequently underwent left fifth toe amputation by Dr.Standiford on 01/02/2024.  He returns today for follow-up.  He says that he is not doing well.  He reports having all the electricity cut off to his house at the end of April and it has not been restored.  He also says that his bank account has been closed and he has no access to his money.  He has been trying to make it to his doctors appointments when his neighbor can drive him.  He says at night to sleep he has to sleep in his car with the [facility] running since there is no power at his house.  He has not taken any of his medications including insulin  for several months now.  He says that he has tried to get his PCP involved with social work, however they were requiring a follow-up visit first.  He has not had a visit with his PCP since June 2024.  Since his discharge from the hospital last month, he says he has tried to keep his left foot amputation site clean and dry.  He did have some follow-up visits with Dr. Malvin and his sutures were removed at his last visit.  He denies any fevers or chills.  He does endorse new onset erythema and swelling of the left foot over the past several days.  He also says over the past 3 to 4 days he has developed darkening skin changes to his left great toe.  He also reports scraping the bottom of his right foot [place] about 2 weeks ago.  Overall he is very tearful about his social and medical state at this time.  He is very depressed about his wife passing away in November.  Current Outpatient Medications  Medication Sig Dispense Refill   atorvastatin  (LIPITOR ) 40 MG tablet Take 1 tablet by mouth once daily (Patient not taking: Reported on 02/10/2024) 90 tablet 3    Blood Glucose Monitoring Suppl (ONE TOUCH ULTRA 2) w/Device KIT Use as directed to check blood sugars twice a day 1 kit 0   ciprofloxacin  (CIPRO ) 500 MG tablet Take 1 tablet (500 mg total) by mouth 2 (two) times daily. (Patient not taking: Reported on 02/10/2024) 20 tablet 0   citalopram  (CELEXA ) 20 MG tablet Take 1 tablet by mouth once daily (Patient not taking: Reported on 02/10/2024) 90 tablet 2   clopidogrel  (PLAVIX ) 75 MG tablet Take 1 tablet (75 mg total) by mouth daily. (Patient not taking: Reported on 02/10/2024) 30 tablet 11   insulin  glargine, 2 Unit Dial, (TOUJEO  MAX SOLOSTAR) 300 UNIT/ML Solostar Pen Inject 100 Units into the skin every morning. (Patient not taking: Reported on 02/10/2024)     irbesartan  (AVAPRO ) 300 MG tablet TAKE 1 TABLET BY MOUTH DAILY (Patient not taking: Reported on 02/10/2024) 90 tablet 3   Lancets (ONETOUCH DELICA PLUS LANCET30G) MISC CHECK BLOOD SUGAR TWICE DAILY 200 each 2   loratadine  (CLARITIN ) 10 MG tablet Take 10 mg by mouth daily as needed. (Patient not taking: Reported on 02/10/2024)     ONETOUCH ULTRA test strip CHECK BLOOD SUGAR TWICE DAILY 200 strip 3   oxyCODONE  (OXY IR/ROXICODONE ) 5 MG immediate release tablet Take 1 tablet (5 mg  total) by mouth every 4 (four) hours as needed for moderate pain (pain score 4-6). (Patient not taking: Reported on 02/10/2024) 20 tablet 0   pantoprazole  (PROTONIX ) 40 MG tablet TAKE 1 TABLET BY MOUTH DAILY (Patient not taking: Reported on 02/10/2024) 100 tablet 2   VITAMIN D , CHOLECALCIFEROL, PO Take 250 mcg by mouth daily.     vitamin B-12 (CYANOCOBALAMIN ) 250 MCG tablet Take 250 mcg by mouth daily.     No current facility-administered medications for this visit.    REVIEW OF SYSTEMS (negative unless checked):   Cardiac:  []  Chest pain or chest pressure? []  Shortness of breath upon activity? []  Shortness of breath when lying flat? []  Irregular heart rhythm?  Vascular:  []  Pain in calf, thigh, or hip brought on by walking? []   Pain in feet at night that wakes you up from your sleep? []  Blood clot in your veins? []  Leg swelling?  Pulmonary:  []  Oxygen at home? []  Productive cough? []  Wheezing?  Neurologic:  []  Sudden weakness in arms or legs? []  Sudden numbness in arms or legs? []  Sudden onset of difficult speaking or slurred speech? []  Temporary loss of vision in one eye? []  Problems with dizziness?  Gastrointestinal:  []  Blood in stool? []  Vomited blood?  Genitourinary:  []  Burning when urinating? []  Blood in urine?  Psychiatric:  []  Major depression  Hematologic:  []  Bleeding problems? []  Problems with blood clotting?  Dermatologic:  [x]  Rashes or ulcers?  Constitutional:  []  Fever or chills?  Ear/Nose/Throat:  []  Change in hearing? []  Nose bleeds? []  Sore throat?  Musculoskeletal:  []  Back pain? []  Joint pain? []  Muscle pain?   Physical Examination   Vitals:   02/10/24 1440 02/10/24 1446  BP: (!) 167/76 (!) 157/74  Pulse: 87   Temp: 98.2 F (36.8 C)   TempSrc: Temporal   SpO2: 95%   Weight: 140 lb (63.5 kg)   Height: 5' 7.99 (1.727 m)    Body mass index is 21.29 kg/m.  General:  no acute distress Gait: Not observed HENT: WNL, normocephalic Pulmonary: normal non-labored breathing  Cardiac: regular Abdomen: soft, NT, no masses Skin: without rashes Vascular Exam/Pulses: nonpalpable pedal pulses bilaterally Extremities: dusky appearing left great toe. Left 5th toe amputation site with some dehiscence and scabbing and surrounding erythema. Open wound to ball of right foot without signs of infection Neurologic: A&O X 3;  No focal weakness or paresthesias are detected Psychiatric:  The pt has Normal affect.           Non-Invasive Vascular imaging   ABI (02/11/2024) R:  ABI: 0.5 (0.41),  PT: mono DP: mono TBI:  0.18 L:  ABI: 0.32 (0.89),  PT: mono DP: mono TBI: 0   LLE Arterial Duplex (02/11/2024) LEFT       PSV cm/sRatioStenosis        Waveform  Comments  +-----------+--------+-----+---------------+----------+--------+  CFA Prox   133                                             +-----------+--------+-----+---------------+----------+--------+  CFA Distal 277          50-74% stenosistriphasic           +-----------+--------+-----+---------------+----------+--------+  DFA       149  triphasic           +-----------+--------+-----+---------------+----------+--------+  SFA Prox   62                          biphasic            +-----------+--------+-----+---------------+----------+--------+  SFA Mid    28                          monophasic          +-----------+--------+-----+---------------+----------+--------+  SFA Distal 59                          monophasic          +-----------+--------+-----+---------------+----------+--------+  POP Prox   41                          monophasic          +-----------+--------+-----+---------------+----------+--------+  POP Distal 23                          monophasic          +-----------+--------+-----+---------------+----------+--------+  ATA Distal 24                          monophasic          +-----------+--------+-----+---------------+----------+--------+  PTA Distal 18                          monophasic          +-----------+--------+-----+---------------+----------+--------+  PERO Distal11                          monophasic          +-----------+--------+-----+---------------+----------+--------+     Left Stent(s):  +------------------+--------+--------+----------+--------+  SFA Mid - SFA DistPSV cm/sStenosisWaveform  Comments  +------------------+--------+--------+----------+--------+  Prox to Stent     27              monophasic          +------------------+--------+--------+----------+--------+  Proximal Stent            occluded                     +------------------+--------+--------+----------+--------+  Mid Stent                 occluded                    +------------------+--------+--------+----------+--------+  Distal Stent              occluded                    +------------------+--------+--------+----------+--------+  Distal to Stent   59              monophasic          +------------------+--------+--------+----------+--------+   Medical Decision Making   [name] is a 68 y.o. male who presents for post angio follow up  The patient presents today for post angio follow-up.  He recently underwent left SFA stenting on 5/23 for critical limb ischemia with left fifth toe osteomyelitis.  He subsequently underwent left fifth toe amputation by podiatry on 5/24 Arterial duplex today  demonstrates an occluded left SFA stent with monophasic distal reconstitution.  He continues to have monophasic tibial vessel flow.  His ABIs on the left have significantly dropped from 0.89 to 0.32 with a toe pressure of 0 He reports not doing well at today's visit.  He has a very difficult social situation.  He says that he has no access to his bank account and all the electric has been turned off to his house since the end of April.  He reports having no money or access to his medications.  It has been at least 6 months since he has taken any of his medications. He also reports a worsening appearance to his left foot over the past couple of days.  He started developing some redness and swelling of his left foot 4 to 5 days ago.  He noticed some darkening discoloration of his left great toe about 3 days ago.  He endorses significant pain in the left foot, however he denies that this wakes him up from his sleep. He also reports scraping the bottom of his right foot in a creek the other day, resulting in a wound On exam he has nonpalpable pedal pulses bilaterally.  He has monophasic DP/PT Doppler signals bilaterally.  He has a small  noninfected wound on the ball of his right foot.  His left fifth toe amputation site has some dehiscence and scabbing.  His left great toe also appears dusky with possible developing dry gangrene under the toenail. I have explained to the patient that his ABIs have significantly decreased on the left since his SFA stent occluded.  He has no toe pressure on the left, resulting in tissue ischemia of the great toe.  It also appears that his left fifth toe amputation site may become unviable without improved blood flow soon. After extensive discussion with the patient, we will consult social work to assist with his needs for electric, medication, and transportation issues.  I will also try to restart the patient on his Plavix  and put him on a short course of antibiotics for possible developing infection in the left foot.  He is also aware that he would benefit from repeat left lower extremity angiogram in the near future to salvage his left foot.  We will schedule him for left lower extremity angiogram with possible SFA/pop lithotripsy/angioplasty/stenting with any MD in the next week.  He may also require staged intervention on the right for a new plantar wound.   Ahmed Holster PA-C [facility] Office: [phone]  Call MD: Se[place]

## 2024-02-11 NOTE — Progress Notes (Signed)
 Office Note   History of Present Illness   [name] is a 68 y.o. ([birth year]) male who presents for post angio follow-up.  He recently underwent left lower extremity angiogram with left SFA stenting on 01/01/2024 by Dr. Sheree.  He subsequently underwent left fifth toe amputation by Dr.Standiford on 01/02/2024.  He returns today for follow-up.  He says that he is not doing well.  He reports having all the electricity cut off to his house at the end of April and it has not been restored.  He also says that his bank account has been closed and he has no access to his money.  He has been trying to make it to his doctors appointments when his neighbor can drive him.  He says at night to sleep he has to sleep in his car with the [facility] running since there is no power at his house.  He has not taken any of his medications including insulin  for several months now.  He says that he has tried to get his PCP involved with social work, however they were requiring a follow-up visit first.  He has not had a visit with his PCP since June 2024.  Since his discharge from the hospital last month, he says he has tried to keep his left foot amputation site clean and dry.  He did have some follow-up visits with Dr. Malvin and his sutures were removed at his last visit.  He denies any fevers or chills.  He does endorse new onset erythema and swelling of the left foot over the past several days.  He also says over the past 3 to 4 days he has developed darkening skin changes to his left great toe.  He also reports scraping the bottom of his right foot [place] a [place] about 2 weeks ago.  Overall he is very tearful about his social and medical state at this time.  He is very depressed about his wife passing away in November.  Current Outpatient Medications  Medication Sig Dispense Refill   atorvastatin  (LIPITOR ) 40 MG tablet Take 1 tablet by mouth once daily (Patient not taking: Reported on 02/10/2024) 90 tablet 3    Blood Glucose Monitoring Suppl (ONE TOUCH ULTRA 2) w/Device KIT Use as directed to check blood sugars twice a day 1 kit 0   ciprofloxacin  (CIPRO ) 500 MG tablet Take 1 tablet (500 mg total) by mouth 2 (two) times daily. (Patient not taking: Reported on 02/10/2024) 20 tablet 0   citalopram  (CELEXA ) 20 MG tablet Take 1 tablet by mouth once daily (Patient not taking: Reported on 02/10/2024) 90 tablet 2   clopidogrel  (PLAVIX ) 75 MG tablet Take 1 tablet (75 mg total) by mouth daily. (Patient not taking: Reported on 02/10/2024) 30 tablet 11   insulin  glargine, 2 Unit Dial, (TOUJEO  MAX SOLOSTAR) 300 UNIT/ML Solostar Pen Inject 100 Units into the skin every morning. (Patient not taking: Reported on 02/10/2024)     irbesartan  (AVAPRO ) 300 MG tablet TAKE 1 TABLET BY MOUTH DAILY (Patient not taking: Reported on 02/10/2024) 90 tablet 3   Lancets (ONETOUCH DELICA PLUS LANCET30G) MISC CHECK BLOOD SUGAR TWICE DAILY 200 each 2   loratadine  (CLARITIN ) 10 MG tablet Take 10 mg by mouth daily as needed. (Patient not taking: Reported on 02/10/2024)     ONETOUCH ULTRA test strip CHECK BLOOD SUGAR TWICE DAILY 200 strip 3   oxyCODONE  (OXY IR/ROXICODONE ) 5 MG immediate release tablet Take 1 tablet (5 mg  total) by mouth every 4 (four) hours as needed for moderate pain (pain score 4-6). (Patient not taking: Reported on 02/10/2024) 20 tablet 0   pantoprazole  (PROTONIX ) 40 MG tablet TAKE 1 TABLET BY MOUTH DAILY (Patient not taking: Reported on 02/10/2024) 100 tablet 2   VITAMIN D , CHOLECALCIFEROL, PO Take 250 mcg by mouth daily.     vitamin B-12 (CYANOCOBALAMIN ) 250 MCG tablet Take 250 mcg by mouth daily.     No current facility-administered medications for this visit.    REVIEW OF SYSTEMS (negative unless checked):   Cardiac:  []  Chest pain or chest pressure? []  Shortness of breath upon activity? []  Shortness of breath when lying flat? []  Irregular heart rhythm?  Vascular:  []  Pain in calf, thigh, or hip brought on by walking? []   Pain in feet at night that wakes you up from your sleep? []  Blood clot in your veins? []  Leg swelling?  Pulmonary:  []  Oxygen at home? []  Productive cough? []  Wheezing?  Neurologic:  []  Sudden weakness in arms or legs? []  Sudden numbness in arms or legs? []  Sudden onset of difficult speaking or slurred speech? []  Temporary loss of vision in one eye? []  Problems with dizziness?  Gastrointestinal:  []  Blood in stool? []  Vomited blood?  Genitourinary:  []  Burning when urinating? []  Blood in urine?  Psychiatric:  []  Major depression  Hematologic:  []  Bleeding problems? []  Problems with blood clotting?  Dermatologic:  [x]  Rashes or ulcers?  Constitutional:  []  Fever or chills?  Ear/Nose/Throat:  []  Change in hearing? []  Nose bleeds? []  Sore throat?  Musculoskeletal:  []  Back pain? []  Joint pain? []  Muscle pain?   Physical Examination   Vitals:   02/10/24 1440 02/10/24 1446  BP: (!) 167/76 (!) 157/74  Pulse: 87   Temp: 98.2 F (36.8 C)   TempSrc: Temporal   SpO2: 95%   Weight: 140 lb (63.5 kg)   Height: 5' 7.99 (1.727 m)    Body mass index is 21.29 kg/m.  General:  no acute distress Gait: Not observed HENT: WNL, normocephalic Pulmonary: normal non-labored breathing  Cardiac: regular Abdomen: soft, NT, no masses Skin: without rashes Vascular Exam/Pulses: nonpalpable pedal pulses bilaterally Extremities: dusky appearing left great toe. Left 5th toe amputation site with some dehiscence and scabbing and surrounding erythema. Open wound to ball of right foot without signs of infection Neurologic: A&O X 3;  No focal weakness or paresthesias are detected Psychiatric:  The pt has Normal affect.           Non-Invasive Vascular imaging   ABI (02/11/2024) R:  ABI: 0.5 (0.41),  PT: mono DP: mono TBI:  0.18 L:  ABI: 0.32 (0.89),  PT: mono DP: mono TBI: 0   LLE Arterial Duplex (02/11/2024) LEFT       PSV cm/sRatioStenosis        Waveform  Comments  +-----------+--------+-----+---------------+----------+--------+  CFA Prox   133                                             +-----------+--------+-----+---------------+----------+--------+  CFA Distal 277          50-74% stenosistriphasic           +-----------+--------+-----+---------------+----------+--------+  DFA       149  triphasic           +-----------+--------+-----+---------------+----------+--------+  SFA Prox   62                          biphasic            +-----------+--------+-----+---------------+----------+--------+  SFA Mid    28                          monophasic          +-----------+--------+-----+---------------+----------+--------+  SFA Distal 59                          monophasic          +-----------+--------+-----+---------------+----------+--------+  POP Prox   41                          monophasic          +-----------+--------+-----+---------------+----------+--------+  POP Distal 23                          monophasic          +-----------+--------+-----+---------------+----------+--------+  ATA Distal 24                          monophasic          +-----------+--------+-----+---------------+----------+--------+  PTA Distal 18                          monophasic          +-----------+--------+-----+---------------+----------+--------+  PERO Distal11                          monophasic          +-----------+--------+-----+---------------+----------+--------+     Left Stent(s):  +------------------+--------+--------+----------+--------+  SFA Mid - SFA DistPSV cm/sStenosisWaveform  Comments  +------------------+--------+--------+----------+--------+  Prox to Stent     27              monophasic          +------------------+--------+--------+----------+--------+  Proximal Stent            occluded                     +------------------+--------+--------+----------+--------+  Mid Stent                 occluded                    +------------------+--------+--------+----------+--------+  Distal Stent              occluded                    +------------------+--------+--------+----------+--------+  Distal to Stent   59              monophasic          +------------------+--------+--------+----------+--------+   Medical Decision Making   [name] is a 68 y.o. male who presents for post angio follow up  The patient presents today for post angio follow-up.  He recently underwent left SFA stenting on 5/23 for critical limb ischemia with left fifth toe osteomyelitis.  He subsequently underwent left fifth toe amputation by podiatry on 5/24 Arterial duplex today  demonstrates an occluded left SFA stent with monophasic distal reconstitution.  He continues to have monophasic tibial vessel flow.  His ABIs on the left have significantly dropped from 0.89 to 0.32 with a toe pressure of 0 He reports not doing well at today's visit.  He has a very difficult social situation.  He says that he has no access to his bank account and all the electric has been turned off to his house since the end of April.  He reports having no money or access to his medications.  It has been at least 6 months since he has taken any of his medications. He also reports a worsening appearance to his left foot over the past couple of days.  He started developing some redness and swelling of his left foot 4 to 5 days ago.  He noticed some darkening discoloration of his left great toe about 3 days ago.  He endorses significant pain in the left foot, however he denies that this wakes him up from his sleep. He also reports scraping the bottom of his right foot in a creek the other day, resulting in a wound On exam he has nonpalpable pedal pulses bilaterally.  He has monophasic DP/PT Doppler signals bilaterally.  He has a small  noninfected wound on the ball of his right foot.  His left fifth toe amputation site has some dehiscence and scabbing.  His left great toe also appears dusky with possible developing dry gangrene under the toenail. I have explained to the patient that his ABIs have significantly decreased on the left since his SFA stent occluded.  He has no toe pressure on the left, resulting in tissue ischemia of the great toe.  It also appears that his left fifth toe amputation site may become unviable without improved blood flow soon. After extensive discussion with the patient, we will consult social work to assist with his needs for electric, medication, and transportation issues.  I will also try to restart the patient on his Plavix  and put him on a short course of antibiotics for possible developing infection in the left foot.  He is also aware that he would benefit from repeat left lower extremity angiogram in the near future to salvage his left foot.  We will schedule him for left lower extremity angiogram with possible SFA/pop lithotripsy/angioplasty/stenting with any MD in the next week.  He may also require staged intervention on the right for a new plantar wound.   Ahmed Holster PA-C [facility] Office: [phone]  Call MD: Serene

## 2024-02-11 NOTE — Progress Notes (Deleted)
 Cardiology Office Note   Date:  02/11/2024   ID:  Alroy, Portela 03/19/1956, MRN [id number]  PCP:  Norleen Lynwood LELON, MD  Cardiologist:   Deatrice Cage, MD   No chief complaint on file.     History of Present Illness:  [name] is a 68 y.o. male who presents for a follow up visit regarding PAD, coronary artery disease and uncontrolled hypertension. He has known history of insulin -requiring DM (dx'ed 1992),  Hyperlipidemia, hypertension and tobacco use. He is followed for right calf claudication due to occluded right SFA. Noninvasive evaluation in August 2015 showed an ABI of 0.44 on the right and normal on the left. Duplex showed long occlusion of right SFA from mid to distal segment with reconstitution in popliteal artery. There was significant left SFA stenosis.   He had atypical chest pain in late 2015. Nuclear stress test showed a small inferolateral infarct with no significant ischemia. He was treated medically given that his chest pain resolved.  He quit smoking in December 2017.   He was hospitalized in October 2020 with sepsis secondary to UTI and pyelonephritis as well as hypertensive urgency.  His troponin was mildly elevated and peaked at 741.  CTA of the chest was negative for pulmonary embolism but he was noted to have coronary artery calcifications.  Echocardiogram during admission showed an EF of 60 to 65%, moderate LVH and no significant valvular abnormalities.  During his hospitalization, he was noted to have short runs of SVT in the setting of electrolyte abnormalities. The patient subsequently underwent outpatient cardiac CTA which showed coronary calcium  score 412.  There was mild left main stenosis and mild first diagonal stenosis.  Previous ZIO monitor in 2020 showed normal sinus rhythm with an average heart rate of 76 bpm.  He was noted to have frequent short runs of SVT as well as paroxysmal atrial fibrillation with a burden of less than 1%.  The longest  episode lasted 3 minutes.  The patient was switched from amlodipine  to diltiazem . The patient has known history of refractory hypertension.  He had renal artery duplex done in 2022 which overall was a suboptimal study.  It showed no significant right renal artery stenosis but there was borderline stenosis affecting the left renal artery. Most recent lower extremity arterial Doppler showed an ABI of 0.54 on the right and 1.03 on the left. Abdominal aortic ultrasound showed no evidence of aneurysm. He was seen last year in August and had atypical chest pain.  He was prescribed isosorbide  but noted no significant change in symptoms.  He stopped the medication after 1 month. He has been doing well with no recent chest pain, worsening dyspnea or palpitations.  He has minimal claudication but does complain of numbness in his toes.     Past Medical History:  Diagnosis Date   Adenomatous colon polyp    Allergic rhinitis    At risk for sleep apnea    STOP-BANG= 5   SENT TO PCP 03-14-2014   Bladder cancer (HCC)    CAD (coronary artery disease)    a. 07/2014 low risk MV; b. 07/2019 Cor CTA (FFR): LM 25-49 (nl), LAD mild prox/mid plaque (nl), D1 25-49p(nl w/ abnl FFR of 0.73 in inf branch), LCX/OM1 mild prox/mid plaque (nl), RCA nondominant, minimal Ca2+ plaque (nl), RPDA (mildly abnl @ 0.79)-->Med Rx..   Cataract    surgically removed bilateral   Condyloma acuminatum of penis    Diabetic neuropathy (HCC)  Diastolic dysfunction    a. 05/2019 Echo: EF 60-65%, no rwam, mod LVH, impaired relaxation, nl RV size/fxn, trace MR, triv TR.   Diverticulosis    GERD (gastroesophageal reflux disease)    History of bladder cancer    s/p  turbt  2013/   transitional cell carcinoma--    History of condyloma acuminatum    PERINEAL AREA  W/ RECURRENCY   History of gout    Hyperlipidemia    Hypertension    Lower urinary tract symptoms (LUTS)    PAF (paroxysmal atrial fibrillation) (HCC)    a. 06/2019 Event  monitor: PAF <1% burden. Longest 3 mins 36 secs.   Productive cough    PSVT (paroxysmal supraventricular tachycardia) (HCC)    a. 06/2019 Event monitor: 112 episodes of SVT, longest 21 secs.   PVD (peripheral vascular disease) with claudication (HCC)    a. 03/2014 LE art duplex: long segment occlusion of mid to distal R SFA; b. 03/2020 ABI: nl left and mildly improved R ABI->med rx.   Renal artery stenosis (HCC)    Renal artery stenosis (HCC)    a. 12/2020 Renal art duplex: RRA 1-59%, LRA >60%.   Smokers' cough (HCC)    Type 2 diabetes mellitus with insulin  therapy (HCC) 1992   monitor by  dr ellsion   Wears dentures    upper    Past Surgical History:  Procedure Laterality Date   AMPUTATION Left 01/02/2024   Procedure: AMPUTATION, FOOT, RAY;  Surgeon: Malvin Marsa FALCON, DPM;  Location: [facility] OR;  Service: Orthopedics/Podiatry;  Laterality: Left;  FIFTH TOE   AXILLARY HIDRADENITIS EXCISION  1997   CARDIOVASCULAR STRESS TEST  07-24-2014  dr darron grass   Low risk lexiscan  nuclear study with apical thinning and small inferolateral wall infarct at mid & basal level , no ischemia/  normal LVF and wall motion , ef 59%   CATARACT EXTRACTION Left    CATARACT EXTRACTION W/ INTRAOCULAR LENS IMPLANT Right    CO2 LASER APPLICATION N/A 03/20/2014   Procedure: CO2 LASER APPLICATION,PENIS, GROIN, ANUS;  Surgeon: Elspeth KYM Schultze, MD;  Location: [facility];  Service: General;  Laterality: N/A;   CO2 LASER APPLICATION N/A 05/21/2015   Procedure: CO2 LASER APPLICATION;  Surgeon: Mark Ottelin, MD;  Location: [facility];  Service: Urology;  Laterality: N/A;   CONDYLOMA EXCISION/FULGURATION N/A 05/21/2015   Procedure: CONDYLOMA REMOVAL;  Surgeon: Mark Ottelin, MD;  Location: [facility];  Service: Urology;  Laterality: N/A;   HEMORRHOID SURGERY  10/24/2014   Procedure: HEMORRHOIDECTOMY;  Surgeon: Elspeth Schultze, MD;  Location: [facility];   Service: General;;   INCISION AND DRAINAGE ABSCESS Left 10/24/2014   Procedure: INCISION AND DRAINAGE ABSCESS;  Surgeon: Elspeth Schultze, MD;  Location: [facility];  Service: General;  Laterality: Left;   INGUINAL HIDRADENITIS EXCISION  1998, 1999   LASER ABLATION CONDOLAMATA N/A 03/20/2014   Procedure: EXAM UNDER ANESTHESIA, REMOVAL/ABLATION OF CONDYLOMATA PENIS,GROINS, ANUS, ANAL CANAL;  Surgeon: Elspeth KYM Schultze, MD;  Location: [facility];  Service: General;  Laterality: N/A;  groin and anus   LASER ABLATION CONDOLAMATA N/A 10/24/2014   Procedure: LASER ABLATION CONDOLAMATA;  Surgeon: Elspeth Schultze, MD;  Location: [facility] ;  Service: General;  Laterality: N/A;   LASER ABLATION OF PENILE AND PERIANAL WARTS  07-29-2007  Dr. Schultze   LEFT SHOULDER SURGERY  2003   LOWER EXTREMITY ANGIOGRAPHY N/A 01/01/2024   Procedure: Lower Extremity  Angiography;  Surgeon: Sheree Penne Bruckner, MD;  Location: [facility] CV [facility];  Service: Vascular;  Laterality: N/A;   LOWER EXTREMITY INTERVENTION  01/01/2024   Procedure: LOWER EXTREMITY INTERVENTION;  Surgeon: Sheree Penne Bruckner, MD;  Location: [facility] INVASIVE CV LAB;  Service: Vascular;;   MASS EXCISION N/A 10/24/2014   Procedure: EXCISION OF PERINEAL MASS/SINUS;  Surgeon: Elspeth Schultze, MD;  Location: [facility];  Service: General;  Laterality: N/A;   MOHS SURGERY     back   MOHS SURGERY  2017   face   MULTIPLE TOOTH EXTRACTIONS     PERINEAL HIDRADENITIS EXCISION  1998, 1999   TRANSURETHRAL RESECTION OF BLADDER TUMOR  05/21/2012   Procedure: TRANSURETHRAL RESECTION OF BLADDER TUMOR (TURBT);  Surgeon: Mark C Ottelin, MD;  Location: [facility]  Service: Urology;  Laterality: N/A;        Current Outpatient Medications  Medication Sig Dispense Refill   atorvastatin  (LIPITOR ) 40 MG tablet Take 1 tablet by mouth once daily (Patient not taking: Reported on 02/10/2024) 90 tablet 3    Blood Glucose Monitoring Suppl (ONE TOUCH ULTRA 2) w/Device KIT Use as directed to check blood sugars twice a day 1 kit 0   ciprofloxacin  (CIPRO ) 500 MG tablet Take 1 tablet (500 mg total) by mouth 2 (two) times daily. (Patient not taking: Reported on 02/10/2024) 20 tablet 0   citalopram  (CELEXA ) 20 MG tablet Take 1 tablet by mouth once daily (Patient not taking: Reported on 02/10/2024) 90 tablet 2   clopidogrel  (PLAVIX ) 75 MG tablet Take 1 tablet (75 mg total) by mouth daily. (Patient not taking: Reported on 02/10/2024) 30 tablet 11   insulin  glargine, 2 Unit Dial, (TOUJEO  MAX SOLOSTAR) 300 UNIT/ML Solostar Pen Inject 100 Units into the skin every morning. (Patient not taking: Reported on 02/10/2024)     irbesartan  (AVAPRO ) 300 MG tablet TAKE 1 TABLET BY MOUTH DAILY (Patient not taking: Reported on 02/10/2024) 90 tablet 3   Lancets (ONETOUCH DELICA PLUS LANCET30G) MISC CHECK BLOOD SUGAR TWICE DAILY 200 each 2   loratadine  (CLARITIN ) 10 MG tablet Take 10 mg by mouth daily as needed. (Patient not taking: Reported on 02/10/2024)     ONETOUCH ULTRA test strip CHECK BLOOD SUGAR TWICE DAILY 200 strip 3   oxyCODONE  (OXY IR/ROXICODONE ) 5 MG immediate release tablet Take 1 tablet (5 mg total) by mouth every 4 (four) hours as needed for moderate pain (pain score 4-6). (Patient not taking: Reported on 02/10/2024) 20 tablet 0   pantoprazole  (PROTONIX ) 40 MG tablet TAKE 1 TABLET BY MOUTH DAILY (Patient not taking: Reported on 02/10/2024) 100 tablet 2   vitamin B-12 (CYANOCOBALAMIN ) 250 MCG tablet Take 250 mcg by mouth daily.     VITAMIN D , CHOLECALCIFEROL, PO Take 250 mcg by mouth daily.     No current facility-administered medications for this visit.    Allergies:   Cefepime , Metoprolol , and Myrbetriq  [mirabegron ]    Social History:  The patient  reports that he has been smoking cigars and cigarettes. He started smoking about 49 years ago. He has a 41 pack-year smoking history. He has never used smokeless tobacco. He  reports that he does not drink alcohol and does not use drugs.   Family History:  The patient's family history includes Diabetes in his maternal aunt; Hypertension in his mother; Lung cancer in his father.    ROS:  Please see the history of present illness.   Otherwise, review of systems are positive for none.  All other systems are reviewed and negative.    PHYSICAL EXAM: VS:  There were no vitals taken for this visit. , BMI There is no height or weight on file to calculate BMI. GEN: Well nourished, well developed, in no acute distress  HEENT: normal  Neck: no JVD, carotid bruits, or masses Cardiac: RRR; no murmurs, rubs, or gallops, trace edema  Respiratory:  clear to auscultation bilaterally, normal work of breathing GI: soft, nontender, nondistended, + BS MS: no deformity or atrophy  Skin: warm and dry, no rash Neuro:  Strength and sensation are intact Psych: euthymic mood, full affect   EKG:  EKG is ordered today. EKG showed normal sinus rhythm with poor R wave progression in the anterior leads.  No significant ST changes.   Recent Labs: 01/02/2024: ALT 14; BUN 11; Creatinine, Ser 0.99; Hemoglobin 15.1; Magnesium  2.0; Platelets 260; Potassium 4.2; Sodium 134    Lipid Panel    Component Value Date/Time   CHOL 132 12/31/2023 0312   CHOL 151 06/16/2019 1144   TRIG 173 (H) 12/31/2023 0312   HDL 33 (L) 12/31/2023 0312   HDL 28 (L) 06/16/2019 1144   CHOLHDL 4.0 12/31/2023 0312   VLDL 35 12/31/2023 0312   LDLCALC 64 12/31/2023 0312   LDLCALC 88 06/16/2019 1144   LDLDIRECT 54.0 05/01/2021 1525      Wt Readings from Last 3 Encounters:  02/10/24 140 lb (63.5 kg)  01/02/24 140 lb 14 oz (63.9 kg)  07/07/23 207 lb (93.9 kg)       ASSESSMENT AND PLAN:  1.  Peripheral arterial disease: Known occluded right SFA .stable right calf claudication.  Numbness in his toes are likely due to neuropathy.  Continue medical therapy.  2. Coronary artery disease involving native  coronary arteries without angina: Cardiac CTA showed elevated calcium  score as outlined above with mild left main and diagonal disease.  Recommend aggressive medical therapy.  His chest pain is overall atypical and isosorbide  did not make any difference in his symptoms and thus he stopped the medication after 1 month.  No need to resume.  3.  Paroxysmal supraventricular tachycardia: A. fib burden was not significant enough to justify anticoagulation.  Symptoms are controlled with diltiazem .  4. Essential hypertension: Blood pressure is now well-controlled on current medications.  He is going to get labs done with his physical next week.  5.  Hyperlipidemia: Continue treatment with atorvastatin .  Most recent LDL was 62.  His triglyceride was mildly elevated but there has been no cardiovascular benefit for fibrates.  Thus, I discontinued fenofibrate .   Disposition:   FU with me in 6 months.  Signed,  Deatrice Cage, MD  02/11/2024 8:05 AM    [facility]

## 2024-02-15 ENCOUNTER — Observation Stay (HOSPITAL_BASED_OUTPATIENT_CLINIC_OR_DEPARTMENT_OTHER)

## 2024-02-15 ENCOUNTER — Other Ambulatory Visit: Payer: Self-pay

## 2024-02-15 ENCOUNTER — Observation Stay (HOSPITAL_COMMUNITY)
Admission: RE | Admit: 2024-02-15 | Discharge: 2024-02-16 | Disposition: A | Discharge status: Home / Self Care | Attending: Vascular Surgery | Admitting: Vascular Surgery

## 2024-02-15 ENCOUNTER — Encounter (HOSPITAL_COMMUNITY)
Admission: RE | Disposition: A | Discharge status: Home / Self Care | Payer: Self-pay | Source: Home / Self Care | Attending: Vascular Surgery

## 2024-02-15 DIAGNOSIS — Z79899 Other long term (current) drug therapy: Secondary | ICD-10-CM | POA: Insufficient documentation

## 2024-02-15 DIAGNOSIS — I70222 Atherosclerosis of native arteries of extremities with rest pain, left leg: Secondary | ICD-10-CM | POA: Diagnosis not present

## 2024-02-15 DIAGNOSIS — I7090 Unspecified atherosclerosis: Secondary | ICD-10-CM | POA: Diagnosis not present

## 2024-02-15 DIAGNOSIS — I70201 Unspecified atherosclerosis of native arteries of extremities, right leg: Secondary | ICD-10-CM | POA: Diagnosis not present

## 2024-02-15 DIAGNOSIS — I7092 Chronic total occlusion of artery of the extremities: Secondary | ICD-10-CM

## 2024-02-15 DIAGNOSIS — I70244 Atherosclerosis of native arteries of left leg with ulceration of heel and midfoot: Principal | ICD-10-CM | POA: Insufficient documentation

## 2024-02-15 DIAGNOSIS — I70269 Atherosclerosis of native arteries of extremities with gangrene, unspecified extremity: Secondary | ICD-10-CM | POA: Diagnosis present

## 2024-02-15 DIAGNOSIS — Z9889 Other specified postprocedural states: Secondary | ICD-10-CM | POA: Diagnosis not present

## 2024-02-15 DIAGNOSIS — L97529 Non-pressure chronic ulcer of other part of left foot with unspecified severity: Secondary | ICD-10-CM | POA: Diagnosis not present

## 2024-02-15 DIAGNOSIS — I739 Peripheral vascular disease, unspecified: Secondary | ICD-10-CM | POA: Diagnosis not present

## 2024-02-15 DIAGNOSIS — T82856A Stenosis of peripheral vascular stent, initial encounter: Secondary | ICD-10-CM

## 2024-02-15 DIAGNOSIS — Z7902 Long term (current) use of antithrombotics/antiplatelets: Secondary | ICD-10-CM | POA: Insufficient documentation

## 2024-02-15 HISTORY — PX: LOWER EXTREMITY INTERVENTION: CATH118252

## 2024-02-15 HISTORY — PX: LOWER EXTREMITY ANGIOGRAPHY: CATH118251

## 2024-02-15 HISTORY — PX: ABDOMINAL AORTOGRAM W/LOWER EXTREMITY: CATH118223

## 2024-02-15 LAB — GLUCOSE, CAPILLARY
Glucose-Capillary: 348 mg/dL — ABNORMAL HIGH (ref 70–99)
Glucose-Capillary: 388 mg/dL — ABNORMAL HIGH (ref 70–99)
Glucose-Capillary: 244 mg/dL — ABNORMAL HIGH (ref 70–99)
Glucose-Capillary: 124 mg/dL — ABNORMAL HIGH (ref 70–99)
Glucose-Capillary: 143 mg/dL — ABNORMAL HIGH (ref 70–99)
Glucose-Capillary: 410 mg/dL — ABNORMAL HIGH (ref 70–99)

## 2024-02-15 LAB — POCT I-STAT, CHEM 8
BUN: 6 mg/dL — ABNORMAL LOW (ref 8–23)
BUN: 8 mg/dL (ref 8–23)
Calcium, Ion: 1.08 mmol/L — ABNORMAL LOW (ref 1.15–1.40)
Calcium, Ion: 1.14 mmol/L — ABNORMAL LOW (ref 1.15–1.40)
Chloride: 91 mmol/L — ABNORMAL LOW (ref 98–111)
Chloride: 92 mmol/L — ABNORMAL LOW (ref 98–111)
Creatinine, Ser: 0.8 mg/dL (ref 0.61–1.24)
Creatinine, Ser: 0.9 mg/dL (ref 0.61–1.24)
Glucose, Bld: 396 mg/dL — ABNORMAL HIGH (ref 70–99)
Glucose, Bld: 400 mg/dL — ABNORMAL HIGH (ref 70–99)
HCT: 42 % (ref 39.0–52.0)
HCT: 45 % (ref 39.0–52.0)
Hemoglobin: 14.3 g/dL (ref 13.0–17.0)
Hemoglobin: 15.3 g/dL (ref 13.0–17.0)
Potassium: 3 mmol/L — ABNORMAL LOW (ref 3.5–5.1)
Potassium: 6.1 mmol/L — ABNORMAL HIGH (ref 3.5–5.1)
Sodium: 132 mmol/L — ABNORMAL LOW (ref 135–145)
Sodium: 133 mmol/L — ABNORMAL LOW (ref 135–145)
TCO2: 26 mmol/L (ref 22–32)
TCO2: 31 mmol/L (ref 22–32)

## 2024-02-15 LAB — POCT ACTIVATED CLOTTING TIME
Activated Clotting Time: 222 s
Activated Clotting Time: 158 s

## 2024-02-15 SURGERY — ABDOMINAL AORTOGRAM W/LOWER EXTREMITY
Anesthesia: LOCAL

## 2024-02-15 MED ORDER — ONDANSETRON HCL 4 MG/2ML IJ SOLN: 4.0000 mg | Freq: Four times a day (QID) | INTRAMUSCULAR | Status: DC | PRN

## 2024-02-15 MED ORDER — INSULIN ASPART 100 UNIT/ML IJ SOLN
0.0000 [IU] | Freq: Three times a day (TID) | INTRAMUSCULAR | Status: DC
  Administered 2024-02-15: 2 [IU] via SUBCUTANEOUS
  Administered 2024-02-16: 11 [IU] via SUBCUTANEOUS

## 2024-02-15 MED ORDER — MIDAZOLAM HCL 2 MG/2ML IJ SOLN
INTRAMUSCULAR | Status: DC | PRN
  Administered 2024-02-15: 1 mg via INTRAVENOUS

## 2024-02-15 MED ORDER — SODIUM CHLORIDE 0.9 % WEIGHT BASED INFUSION
1.0000 mL/kg/h | INTRAVENOUS | Status: AC
  Administered 2024-02-15 (×2): 1 mL/kg/h via INTRAVENOUS

## 2024-02-15 MED ORDER — OXYCODONE HCL 5 MG PO TABS: 5.0000 mg | ORAL_TABLET | ORAL | Status: DC | PRN

## 2024-02-15 MED ORDER — IODIXANOL 320 MG/ML IV SOLN
INTRAVENOUS | Status: DC | PRN
  Administered 2024-02-15: 60 mL via INTRA_ARTERIAL

## 2024-02-15 MED ORDER — INSULIN ASPART 100 UNIT/ML IJ SOLN
0.0000 [IU] | Freq: Every day | INTRAMUSCULAR | Status: DC
  Administered 2024-02-15: 5 [IU] via SUBCUTANEOUS

## 2024-02-15 MED ORDER — HYDRALAZINE HCL 20 MG/ML IJ SOLN
INTRAMUSCULAR | Status: AC
  Filled 2024-02-15: qty 1

## 2024-02-15 MED ORDER — FENTANYL CITRATE (PF) 100 MCG/2ML IJ SOLN
INTRAMUSCULAR | Status: AC
  Filled 2024-02-15: qty 2

## 2024-02-15 MED ORDER — ASPIRIN 81 MG PO CHEW
CHEWABLE_TABLET | ORAL | Status: AC
  Filled 2024-02-15: qty 1

## 2024-02-15 MED ORDER — LIDOCAINE HCL (PF) 1 % IJ SOLN
INTRAMUSCULAR | Status: DC | PRN
  Administered 2024-02-15: 10 mL
  Administered 2024-02-15: 15 mL

## 2024-02-15 MED ORDER — LIDOCAINE HCL (PF) 1 % IJ SOLN
INTRAMUSCULAR | Status: AC
  Filled 2024-02-15: qty 30

## 2024-02-15 MED ORDER — INSULIN ASPART 100 UNIT/ML IJ SOLN
10.0000 [IU] | Freq: Once | INTRAMUSCULAR | Status: AC
  Administered 2024-02-15: 10 [IU] via SUBCUTANEOUS
  Filled 2024-02-15: qty 1

## 2024-02-15 MED ORDER — HEPARIN (PORCINE) IN NACL 1000-0.9 UT/500ML-% IV SOLN
INTRAVENOUS | Status: DC | PRN
  Administered 2024-02-15 (×2): 500 mL

## 2024-02-15 MED ORDER — PANTOPRAZOLE SODIUM 40 MG PO TBEC
40.0000 mg | DELAYED_RELEASE_TABLET | Freq: Every day | ORAL | Status: DC
  Administered 2024-02-16: 40 mg via ORAL
  Filled 2024-02-15 (×2): qty 1

## 2024-02-15 MED ORDER — HEPARIN SODIUM (PORCINE) 5000 UNIT/ML IJ SOLN
5000.0000 [IU] | Freq: Three times a day (TID) | INTRAMUSCULAR | Status: DC
  Administered 2024-02-15 – 2024-02-16 (×2): 5000 [IU] via SUBCUTANEOUS
  Filled 2024-02-15 (×2): qty 1

## 2024-02-15 MED ORDER — CLOPIDOGREL BISULFATE 300 MG PO TABS
ORAL_TABLET | ORAL | Status: DC | PRN
  Administered 2024-02-15: 300 mg via ORAL

## 2024-02-15 MED ORDER — ACETAMINOPHEN 325 MG PO TABS
650.0000 mg | ORAL_TABLET | ORAL | Status: DC | PRN
  Administered 2024-02-15 – 2024-02-16 (×2): 650 mg via ORAL
  Filled 2024-02-15 (×2): qty 2

## 2024-02-15 MED ORDER — HYDRALAZINE HCL 20 MG/ML IJ SOLN
5.0000 mg | INTRAMUSCULAR | Status: DC | PRN
  Administered 2024-02-15: 5 mg via INTRAVENOUS

## 2024-02-15 MED ORDER — ASPIRIN 81 MG PO TBEC
81.0000 mg | DELAYED_RELEASE_TABLET | Freq: Every day | ORAL | Status: DC
  Administered 2024-02-16: 81 mg via ORAL
  Filled 2024-02-15 (×2): qty 1

## 2024-02-15 MED ORDER — SODIUM CHLORIDE 0.9 % IV SOLN: INTRAVENOUS | Status: DC

## 2024-02-15 MED ORDER — HEPARIN SODIUM (PORCINE) 1000 UNIT/ML IJ SOLN
INTRAMUSCULAR | Status: DC | PRN
  Administered 2024-02-15: 7000 [IU] via INTRAVENOUS

## 2024-02-15 MED ORDER — ASPIRIN 81 MG PO CHEW
CHEWABLE_TABLET | ORAL | Status: DC | PRN
  Administered 2024-02-15: 81 mg via ORAL

## 2024-02-15 MED ORDER — HEPARIN SODIUM (PORCINE) 1000 UNIT/ML IJ SOLN
INTRAMUSCULAR | Status: AC
  Filled 2024-02-15: qty 10

## 2024-02-15 MED ORDER — MIDAZOLAM HCL 2 MG/2ML IJ SOLN
INTRAMUSCULAR | Status: AC
  Filled 2024-02-15: qty 2

## 2024-02-15 MED ORDER — FENTANYL CITRATE (PF) 100 MCG/2ML IJ SOLN
INTRAMUSCULAR | Status: DC | PRN
  Administered 2024-02-15 (×2): 50 ug via INTRAVENOUS

## 2024-02-15 MED ORDER — SODIUM CHLORIDE 0.9% FLUSH
3.0000 mL | INTRAVENOUS | Status: DC | PRN
Start: 2024-02-15 — End: 2024-02-16

## 2024-02-15 MED ORDER — SODIUM CHLORIDE 0.9 % IV SOLN
250.0000 mL | INTRAVENOUS | Status: DC | PRN
Start: 2024-02-15 — End: 2024-02-16

## 2024-02-15 MED ORDER — CLOPIDOGREL BISULFATE 75 MG PO TABS
75.0000 mg | ORAL_TABLET | Freq: Every day | ORAL | Status: DC
  Administered 2024-02-16: 75 mg via ORAL
  Filled 2024-02-15: qty 1

## 2024-02-15 MED ORDER — CLOPIDOGREL BISULFATE 300 MG PO TABS
ORAL_TABLET | ORAL | Status: AC
Start: 2024-02-15 — End: 2024-02-15
  Filled 2024-02-15: qty 1

## 2024-02-15 MED ORDER — ATORVASTATIN CALCIUM 40 MG PO TABS
40.0000 mg | ORAL_TABLET | Freq: Every day | ORAL | Status: DC
  Administered 2024-02-15 – 2024-02-16 (×2): 40 mg via ORAL
  Filled 2024-02-15 (×3): qty 1

## 2024-02-15 MED ORDER — SODIUM CHLORIDE 0.9% FLUSH
3.0000 mL | Freq: Two times a day (BID) | INTRAVENOUS | Status: DC
  Administered 2024-02-15: 3 mL via INTRAVENOUS

## 2024-02-15 SURGICAL SUPPLY — 15 items
BALLOON MUSTANG 5X150X135 (BALLOONS) IMPLANT
CATH OMNI FLUSH 5F 65CM (CATHETERS) IMPLANT
CATH QUICKCROSS SUPP .035X90CM (MICROCATHETER) IMPLANT
GLIDEWIRE ADV .035X260CM (WIRE) IMPLANT
KIT ENCORE 26 ADVANTAGE (KITS) IMPLANT
KIT MICROPUNCTURE NIT STIFF (SHEATH) IMPLANT
KIT PV (KITS) ×1 IMPLANT
SET ATX-X65L (MISCELLANEOUS) IMPLANT
SHEATH CATAPULT 6F 45 MP (SHEATH) IMPLANT
SHEATH PINNACLE 5F 10CM (SHEATH) IMPLANT
SHEATH PINNACLE 6F 10CM (SHEATH) IMPLANT
SHEATH PROBE COVER 6X72 (BAG) IMPLANT
STENT ELUVIA 6X150X130 (Permanent Stent) IMPLANT
TRAY PV CATH (CUSTOM PROCEDURE TRAY) ×1 IMPLANT
WIRE BENTSON .035X145CM (WIRE) IMPLANT

## 2024-02-15 NOTE — Op Note (Signed)
 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 Sheree.  Due to his financial and social issues he is compliant with medications and presented to the office on 7/2 where he was noted to have worsening discoloration of his left foot with skin changes to his left great toe and his noninvasive studies demonstrated a severely depressed ABI with an occluded left SFA stent on duplex.  Risks and benefits of repeat angiogram with intervention were reviewed, he expressed understanding and elected to proceed.  Findings:  Widely patent aorta and paired renal arteries bilaterally. The distal aorta and common iliac arteries are calcified but do not appear to have any flow-limiting stenosis.  On the left, the common femoral artery, profunda and proximal segment are widely patent.  The stent to the mid SFA is occluded with reconstitution shortly after the distal stent edge.  The popliteal artery is widely patent.  There is three-vessel runoff with dominant PT and AT flow.  On the right the common femoral artery and profunda are widely patent.  There is a flush CTO of the SFA.  The above-knee popliteal is reconstituted in the popliteal artery is widely patent.  There is three-vessel runoff with dominant PT and AT flow.   Procedure:  The patient was identified in the holding area and taken to the cath lab   The patient was then placed supine on the table and prepped and draped in the usual sterile fashion.  A time out was called.  Ultrasound was used to evaluate the right common femoral artery.  It was patent .  A digital ultrasound image was acquired.  A micropuncture needle was used to access the right common femoral artery under ultrasound guidance.  An 018 wire was advanced without resistance and a micropuncture sheath was placed.  The 018 wire was removed and a benson wire was placed.  The micropuncture sheath was exchanged for a 5 french sheath.  An omniflush catheter was advanced over the wire to the level of L-1.  An abdominal angiogram was obtained.  Next, using the omniflush catheter and a Bentson wire, the aortic bifurcation was crossed and the catheter was placed into theleft external iliac artery and left runoff was obtained. This demonstrated the above findings.  A glide advantage wire was placed through the Omni Flush catheter and into the SFA.  The short 5 French sheath was then exchanged for a 6 [place] by 45 cm catapult sheath and the patient was systemically heparinized.  Using the glide advantage wire and a quick cross catheter I was able to cross the occluded stents.  An angiogram via the quick cross catheter demonstrated true lumen crossing wire and patency of the popliteal artery.  This was then treated by realigning the stent with a 6 mm x 150 mm Eluvia stent which was postdilated with a 5 mm Mustang balloon.  A completion angiogram demonstrated wide patency with minimal residual stenosis and preserved three-vessel  runoff.  The long 6 French sheath was then exchanged for a short 6 [place] sheath and a glide advantage wire was removed.  right runoff was performed via retrograde sheath injections which demonstrated the above findings.  Contrast: 60 cc Sedation: 32 minutes  Impression: Successful recanalization of the occluded left SFA stent and relining with a new 6 mm Eluvia stent.  Right  lower extremity with a necessitate a femoral to above-knee popliteal artery bypass  Norman GORMAN Serve MD [facility] Office: [phone]

## 2024-02-15 NOTE — Plan of Care (Signed)
  Problem: Education: Goal: Knowledge of General Education information will improve Description: Including pain rating scale, medication(s)/side effects and non-pharmacologic comfort measures Outcome: Not Progressing   Problem: Health Behavior/Discharge Planning: Goal: Ability to manage health-related needs will improve Outcome: Not Progressing   Problem: Clinical Measurements: Goal: Ability to maintain clinical measurements within normal limits will improve Outcome: Not Progressing Goal: Will remain free from infection Outcome: Not Progressing Goal: Diagnostic test results will improve Outcome: Not Progressing Goal: Respiratory complications will improve Outcome: Not Progressing Goal: Cardiovascular complication will be avoided Outcome: Not Progressing   Problem: Activity: Goal: Risk for activity intolerance will decrease Outcome: Not Progressing   Problem: Nutrition: Goal: Adequate nutrition will be maintained Outcome: Not Progressing   Problem: Coping: Goal: Level of anxiety will decrease Outcome: Not Progressing   Problem: Elimination: Goal: Will not experience complications related to bowel motility Outcome: Not Progressing Goal: Will not experience complications related to urinary retention Outcome: Not Progressing   Problem: Pain Managment: Goal: General experience of comfort will improve and/or be controlled Outcome: Not Progressing   Problem: Safety: Goal: Ability to remain free from injury will improve Outcome: Not Progressing   Problem: Skin Integrity: Goal: Risk for impaired skin integrity will decrease Outcome: Not Progressing   Problem: Education: Goal: Ability to describe self-care measures that may prevent or decrease complications (Diabetes Survival Skills Education) will improve Outcome: Not Progressing Goal: Individualized Educational Video(s) Outcome: Not Progressing   Problem: Coping: Goal: Ability to adjust to condition or change in  health will improve Outcome: Not Progressing   Problem: Fluid Volume: Goal: Ability to maintain a balanced intake and output will improve Outcome: Not Progressing   Problem: Health Behavior/Discharge Planning: Goal: Ability to identify and utilize available resources and services will improve Outcome: Not Progressing Goal: Ability to manage health-related needs will improve Outcome: Not Progressing   Problem: Metabolic: Goal: Ability to maintain appropriate glucose levels will improve Outcome: Not Progressing   Problem: Nutritional: Goal: Maintenance of adequate nutrition will improve Outcome: Not Progressing Goal: Progress toward achieving an optimal weight will improve Outcome: Not Progressing   Problem: Skin Integrity: Goal: Risk for impaired skin integrity will decrease Outcome: Not Progressing   Problem: Tissue Perfusion: Goal: Adequacy of tissue perfusion will improve Outcome: Not Progressing   Problem: Education: Goal: Understanding of CV disease, CV risk reduction, and recovery process will improve Outcome: Not Progressing Goal: Individualized Educational Video(s) Outcome: Not Progressing   Problem: Activity: Goal: Ability to return to baseline activity level will improve Outcome: Not Progressing   Problem: Cardiovascular: Goal: Ability to achieve and maintain adequate cardiovascular perfusion will improve Outcome: Not Progressing Goal: Vascular access site(s) Level 0-1 will be maintained Outcome: Not Progressing   Problem: Health Behavior/Discharge Planning: Goal: Ability to safely manage health-related needs after discharge will improve Outcome: Not Progressing

## 2024-02-15 NOTE — Progress Notes (Signed)
 LLE arterial duplex has been completed.   Results can be found under chart review under CV PROC. 02/15/2024 3:40 PM Lalanya Rufener RVT, RDMS

## 2024-02-15 NOTE — Progress Notes (Signed)
 Got charge nurse to come and help speak with patient that leaving would be a risk to his groin site and it would be best for him to rest tonight and leave in the morning after speaking with the doctors.  The patient agreed to stay tonight but refused to continue having the heart monitor on and wants the snacks and sodas that he requested.  Patient resting in bed now with heart monitor off.  Will continue to monitor.  Kristene Sotero BRAVO, RN

## 2024-02-15 NOTE — Progress Notes (Signed)
 Patient sitting on side of bed, taking off his heart monitor and gown, putting on his clothes and saying he is leaving to go home tonight.  Informed the PA for tonight, and said the patient can only leave AMA.  The patient was informed that leaving now is at his own risk and he says that is fine.

## 2024-02-15 NOTE — Progress Notes (Signed)
 Site area: 45F right groin arterial site Site Prior to Removal:  Level 0 Pressure Applied For: 30 minutes Manual:   yes Patient Status During Pull:  stable Post Pull Site:  Level 0 Post Pull Instructions Given:  yes Post Pull Pulses Present: rt dp and pt dopplered Dressing Applied:  gauze and tegaderm Bedrest begins @ 1325 Comments:

## 2024-02-15 NOTE — Interval H&P Note (Signed)
 History and Physical Interval Note:  02/15/2024 10:57 AM  [name]  has presented today for surgery, with the diagnosis of ischemia left leg with tissue loss.  The various methods of treatment have been discussed with the patient and family. After consideration of risks, benefits and other options for treatment, the patient has consented to  Procedure(s): ABDOMINAL AORTOGRAM W/LOWER EXTREMITY (N/A) Lower Extremity Angiography (N/A) LOWER EXTREMITY INTERVENTION (N/A) as a surgical intervention.  The patient's history has been reviewed, patient examined, no change in status, stable for surgery.  I have reviewed the patient's chart and labs.  Questions were answered to the patient's satisfaction.    Plan will be for observation admission after the procedure as he has no transportation and has no one at home.   Norman GORMAN Serve

## 2024-02-16 ENCOUNTER — Encounter (HOSPITAL_COMMUNITY): Payer: Self-pay | Admitting: Vascular Surgery

## 2024-02-16 DIAGNOSIS — L97529 Non-pressure chronic ulcer of other part of left foot with unspecified severity: Secondary | ICD-10-CM | POA: Diagnosis not present

## 2024-02-16 DIAGNOSIS — I70244 Atherosclerosis of native arteries of left leg with ulceration of heel and midfoot: Secondary | ICD-10-CM | POA: Diagnosis not present

## 2024-02-16 DIAGNOSIS — I739 Peripheral vascular disease, unspecified: Secondary | ICD-10-CM | POA: Diagnosis not present

## 2024-02-16 DIAGNOSIS — Z79899 Other long term (current) drug therapy: Secondary | ICD-10-CM | POA: Diagnosis not present

## 2024-02-16 DIAGNOSIS — Z95820 Peripheral vascular angioplasty status with implants and grafts: Secondary | ICD-10-CM | POA: Diagnosis not present

## 2024-02-16 DIAGNOSIS — Z7902 Long term (current) use of antithrombotics/antiplatelets: Secondary | ICD-10-CM | POA: Diagnosis not present

## 2024-02-16 LAB — LIPID PANEL
Cholesterol: 137 mg/dL (ref 0–200)
HDL: 36 mg/dL — ABNORMAL LOW (ref >40)
LDL Cholesterol: 79 mg/dL (ref 0–99)
Total CHOL/HDL Ratio: 3.8 ratio
Triglycerides: 111 mg/dL (ref <150)
VLDL: 22 mg/dL (ref 0–40)

## 2024-02-16 LAB — GLUCOSE, CAPILLARY: Glucose-Capillary: 342 mg/dL — ABNORMAL HIGH (ref 70–99)

## 2024-02-16 MED ORDER — ASPIRIN 81 MG PO TBEC: 81.0000 mg | DELAYED_RELEASE_TABLET | Freq: Every day | ORAL | 12 refills | Status: DC

## 2024-02-16 MED ORDER — OXYCODONE HCL 5 MG PO TABS: 5.0000 mg | ORAL_TABLET | ORAL | 0 refills | Status: DC | PRN

## 2024-02-16 MED ORDER — ATORVASTATIN CALCIUM 40 MG PO TABS: ORAL_TABLET | ORAL | 3 refills | Status: DC

## 2024-02-16 MED ORDER — CLOPIDOGREL BISULFATE 75 MG PO TABS: 75.0000 mg | ORAL_TABLET | Freq: Every day | ORAL | 11 refills | Status: DC

## 2024-02-16 NOTE — Discharge Instructions (Signed)
   [facility]  Discharge Instructions  Lower Extremity Angiogram; Angioplasty/Stenting  Please refer to the following instructions for your post-procedure care. Your surgeon or physician assistant will discuss any changes with you.  Activity  Avoid lifting more than 8 pounds (1 gallons of milk) for 5 days after your procedure. You may walk as much as you can tolerate. It's OK to drive after 72 hours.  Bathing/Showering  You may shower the day after your procedure. If you have a bandage, you may remove it at 24- 48 hours. Clean your incision site with mild soap and water. Pat the area dry with a clean towel.  Diet  Resume your pre-procedure diet. There are no special food restrictions following this procedure. All patients with peripheral vascular disease should follow a low fat/low cholesterol diet. In order to heal from your surgery, it is CRITICAL to get adequate nutrition. Your body requires vitamins, minerals, and protein. Vegetables are the best source of vitamins and minerals. Vegetables also provide the perfect balance of protein. Processed food has little nutritional value, so try to avoid this.  Medications  Resume taking all of your medications unless your doctor tells you not to. If your incision is causing pain, you may take over-the-counter pain relievers such as acetaminophen (Tylenol)  Follow Up  Follow up will be arranged at the time of your procedure. You may have an office visit scheduled or may be scheduled for surgery. Ask your surgeon if you have any questions.  Please call us immediately for any of the following conditions: .Severe or worsening pain your legs or feet at rest or with walking. .Increased pain, redness, drainage at your groin puncture site. .Fever of 101 degrees or higher. .If you have any mild or slow bleeding from your puncture site: lie down, apply firm constant pressure over the area with a piece of gauze or a  clean wash cloth for 30 minutes- no peeking!, call 911 right away if you are still bleeding after 30 minutes, or if the bleeding is heavy and unmanageable.  Reduce your risk factors of vascular disease:  . Stop smoking. If you would like help call [facility] at 1-800-QUIT-NOW (228-025-3008) or [place] at 319-820-6853. . Manage your cholesterol . Maintain a desired weight . Control your diabetes . Keep your blood pressure down .  If you have any questions, please call the office at [phone]

## 2024-02-16 NOTE — Progress Notes (Addendum)
  Progress Note    02/16/2024 9:58 AM 1 Day Post-Op  Subjective:  complaining of pain and numbness still in left foot   Vitals:   02/16/24 0425 02/16/24 0736  BP: (!) 144/63 (!) 132/101  Pulse: 84 87  Resp: 16 16  Temp: 98.1 F (36.7 C) 98 F (36.7 C)  SpO2: 98% 98%   Physical Exam: Cardiac:  regular Lungs:  non labored Incisions:  Right CF access site without swelling or hematoma Extremities:  LLE well perfused and warm with doppler DP/ PT/ Pero signals Neurologic: alert and oriented  CBC    Component Value Date/Time   WBC 10.7 (H) 01/02/2024 0408   RBC 4.53 01/02/2024 0408   HGB 14.3 02/15/2024 0644   HCT 42.0 02/15/2024 0644   PLT 260 01/02/2024 0408   MCV 93.6 01/02/2024 0408   MCH 33.3 01/02/2024 0408   MCHC 35.6 01/02/2024 0408   RDW 12.8 01/02/2024 0408   LYMPHSABS 2.6 01/01/2024 0814   MONOABS 0.8 01/01/2024 0814   EOSABS 0.3 01/01/2024 0814   BASOSABS 0.1 01/01/2024 0814    BMET    Component Value Date/Time   NA 133 (L) 02/15/2024 0644   NA 138 06/16/2019 1144   K 3.0 (L) 02/15/2024 0644   CL 91 (L) 02/15/2024 0644   CO2 28 01/02/2024 0408   GLUCOSE 396 (H) 02/15/2024 0644   BUN 6 (L) 02/15/2024 0644   BUN 15 06/16/2019 1144   CREATININE 0.80 02/15/2024 0644   CALCIUM  9.1 01/02/2024 0408   GFRNONAA >60 01/02/2024 0408   GFRAA >60 02/29/2020 0937    INR    Component Value Date/Time   INR 1.1 06/04/2019 2318     Intake/Output Summary (Last 24 hours) at 02/16/2024 0958 Last data filed at 02/16/2024 0641 Gross per 24 hour  Intake 1719.3 ml  Output 2350 ml  Net -630.7 ml     Assessment/Plan:  68 y.o. male is s/p Aortogram, arteriogram of LLE with balloon angioplasty and stenting of SFA/ AK popliteal artery 1 Day Post-Op   Left is maximally revascularized with three vessel runoff into left foot Right lower extremity with a necessitate a femoral to above-knee popliteal artery bypass  Doppler Left PT/Dp/ Pero signals Right common femoral  access site without swelling or hematoma Aspirin , Statin, Plavix  Will arrange outpatient follow up in 1 month with ABI and LLE arterial duplex  Teretha Damme, PA-C [facility] [phone] 02/16/2024 9:58 AM  VASCULAR STAFF ADDENDUM: I have independently interviewed and examined the patient. I agree with the above.  Left foot color much improved, multiphasic DP and PT signals. Plan for discharge today with aspirin , statin, Plavix  and will 1 month follow-up  Norman GORMAN Serve MD [facility] Phone Number: [phone] 02/16/2024 10:22 AM

## 2024-02-17 ENCOUNTER — Telehealth: Payer: Self-pay | Admitting: Licensed Clinical Social Worker

## 2024-02-17 NOTE — Progress Notes (Unsigned)
 Heart and Vascular Care Navigation  02/17/2024  [name] 1956/07/15 [id number]  Reason for Referral: multiple SDOH needs Patient is participating in a Managed Medicaid Plan: No, [facility] Medicare  Engaged with patient by telephone for initial visit for Heart and Vascular Care Coordination.                                                                                                   Assessment:                                     LCSW was able to reach pt at [phone]. Pt is hard to understand throughout assessment, often does not answer questions fully.  Pt shares he is currently helping a neighbor move firewood. States he lives off grid, confirmed home address. Current emergency contact is his neighbor three doors down. His wife passed away last year, he shares he has had a hard time since then. When I ask how he gets to appts, pt states he drives himself. Feels his providers have not always been understanding that he has been taking care of himself because was taking care of his wife before she died. He has had issues with housing and utilities being shut off in the past year. Had cashed a $10,000 check that then shut down his banking accounts, he has had trouble with managing food costs and sometimes medication costs. Shares he got SNAP approved ($16) and used it for a bag of donuts and a box of chicken nuggets. Receives Social Security at this time, likely will qualify for additional programs through [employer] and [employer] to get his medications at reduced rate (possibly slightly over Medicaid guidelines). Pt interested in getting an appointment with his PCP, drives himself to appts, prefers afternoons. He also is being followed by [place] DSS, his caseworker is Asberry Rattler, [phone]. He is agreeable to me reaching out to her to see what resources pt has engaged with and which ones are something we could further assist with.   When asked pt has no additional  questions for me, okay with me seeing how we can assist with ongoing needs expressed.    HRT/VAS Care Coordination     Patients Home Cardiology Office [facility]   Outpatient Care Team Social Worker   Social Worker Name: [name], [place], [phone]   Living arrangements for the past 2 months Single Family Home   Lives with: Self   Patient Current Insurance Coverage Managed Medicare   Patient Has Concern With Paying Medical Bills Yes   Patient Concerns With Medical Bills pt has concerns overall with finances   Medical Bill Referrals: I have called DSS;  pt receives SNAP and SSI   Does Patient Have Prescription Coverage? Yes   Home Assistive Devices/Equipment --  unable to fully assess- patient declined full TOC call and declines additional call to him at later time   DME Agency NA   [facility] Agency NA       Social History:  SDOH Screenings   Food Insecurity: Food Insecurity Present (02/17/2024)  Housing: High Risk (02/17/2024)  Transportation Needs: Unmet Transportation Needs (02/17/2024)  Utilities: At Risk (02/17/2024)  Alcohol Screen: Low Risk  (06/10/2023)  Depression (PHQ2-9): Low Risk  (06/10/2023)  Financial Resource Strain: Medium Risk (02/17/2024)  Physical Activity: Inactive (06/10/2023)  Social Connections: Socially Isolated (02/16/2024)  Stress: Stress Concern Present (02/17/2024)  Tobacco Use: High Risk (02/10/2024)  Health Literacy: Inadequate Health Literacy (02/17/2024)    SDOH Interventions: Financial Resources:  Surveyor, quantity Strain Interventions: [employer] Provided (reached out to DSS to assess needs) DSS for financial assistance; [employer] for [employer] assistance programs  Food Insecurity:  Food Insecurity Interventions: [employer] Provided (pt recieves SNAP; will send [employer])  Housing Insecurity:  Housing Interventions: Other (Comment) (have reached out to DSS for clarity  regarding housing)  Transportation:   Transportation Interventions: Intervention Not Indicated (pt states he drives himself)    Other Care Navigation Interventions:     Provided Pharmacy assistance resources  Pt denies any issues obtaining medications at this time   Follow-up plan:   LCSW called DSS social worker Asberry Rattler [phone]) I will send pt additional community resources once I've touched base with caseworker to see what next best steps are for pt. Will call and assist with PCP appt, preference for afternoon, any day of the week.

## 2024-02-18 ENCOUNTER — Telehealth: Payer: Self-pay | Admitting: Licensed Clinical Social Worker

## 2024-02-18 NOTE — Discharge Summary (Signed)
 Discharge Summary  Patient ID: [name] [id number] y.o. 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 is a 68 year old male with PAD who recently underwent left SFA stenting on 01/01/2024 with Dr. Sheree. Due to his financial and social issues he is compliant with medications and presented to the office on 7/2 where he was noted to have worsening discoloration of his left foot with skin changes to his left great toe and his noninvasive studies demonstrated a severely depressed ABI with an occluded left SFA stent on duplex. Risks and benefits of repeat angiogram with intervention were reviewed, he expressed understanding and elected to proceed.   Hospital Course: [name] was admitted on 02/15/24 and underwent Ultrasound-guided access of right common femoral artery; Aortogram and bilateral lower extremity angiogram; Third order cannulation of left popliteal artery and Balloon angioplasty and stenting of left SFA/above-knee popliteal artery, 6 mm x 150 mm Eluvia by Dr. Serve. He tolerated the procedure well and was taken to the recovery room in stable condition. He was kept for overnight observation due to not having any transportation home. He attempted to leave AMA late in the evening post intervention. He was encouraged by nursing staff to stay to monitor his femoral access site. The patient agreed to stay the night but refused to continue having the heart monitor on and wanted the snacks and sodas that he requested.   He did well overnight. He continued to have left foot pain and numbness. Left leg well perfused on examination with doppler  PT/DP and Peroneal signals. Right common femoral access site without swelling or hematoma. He remained stable for discharge home. Discussed importance of keeping his feet protected and monitoring his foot wounds. He will discharge home on Aspirin , statin and Plavix . He will have follow up arranged in 1 month with ABI and LLE arterial duplex.    Consults: None  Treatments: analgesia: acetaminophen , anticoagulation: ASA, and surgery: 1. Ultrasound-guided access of right common femoral artery 2. Aortogram and bilateral lower extremity angiogram 3.Third order cannulation of left popliteal artery 4. Balloon angioplasty and stenting of left SFA/above-knee popliteal artery, 6 mm x 150 mm Eluvia 5. 32 minutes moderate sedation with fentanyl  and Versed    Disposition: Discharge disposition: 01-Home or Self Care       Patient Instructions:  Allergies as of 02/16/2024       Reactions   Cefepime  Hives   Had allergic reaction to one of these 3 agents, unclear which one   Metoprolol  Hives   Had allergic reaction to one of these 3 agents, unclear which one *Per RN, highly likely this is the cause of allergic rxn   Myrbetriq  [mirabegron ] Hives   Had allergic reaction to one of these 3 agents, unclear which one        Medication List     TAKE these medications    aspirin  EC 81 MG tablet Take 1 tablet (81 mg total) by mouth daily. Swallow whole.   atorvastatin  40 MG tablet Commonly known as: LIPITOR  TAKE 1 TABLET BY MOUTH ONCE DAILY What changed:  how much to take how to take this when to take this additional instructions   ciprofloxacin  500 MG  tablet Commonly known as: CIPRO  Take 1 tablet (500 mg total) by mouth 2 (two) times daily.   citalopram  20 MG tablet Commonly known as: CELEXA  Take 1 tablet by mouth once daily   clopidogrel  75 MG tablet Commonly known as: Plavix  Take 1 tablet (75 mg total) by mouth daily.   irbesartan  300 MG tablet Commonly known as: AVAPRO  TAKE 1  TABLET BY MOUTH DAILY   loratadine  10 MG tablet Commonly known as: CLARITIN  Take 10 mg by mouth daily as needed.   ONE TOUCH ULTRA 2 w/Device Kit Use as directed to check blood sugars twice a day   OneTouch Delica Plus Lancet30G Misc CHECK BLOOD SUGAR TWICE DAILY   OneTouch Ultra test strip Generic drug: glucose blood CHECK BLOOD SUGAR TWICE DAILY   oxyCODONE  5 MG immediate release tablet Commonly known as: Oxy IR/ROXICODONE  Take 1 tablet (5 mg total) by mouth every 4 (four) hours as needed for severe pain (pain score 7-10). What changed: reasons to take this   pantoprazole  40 MG tablet Commonly known as: PROTONIX  TAKE 1 TABLET BY MOUTH DAILY   Toujeo  Max SoloStar 300 UNIT/ML Solostar Pen Generic drug: insulin  glargine (2 Unit Dial) Inject 100 Units into the skin every morning.   vitamin B-12 250 MCG tablet Commonly known as: CYANOCOBALAMIN  Take 250 mcg by mouth daily.   VITAMIN D  (CHOLECALCIFEROL) PO Take 250 mcg by mouth daily.       Activity: activity as tolerated, no driving while on analgesics, and no heavy lifting for 2 weeks Diet: regular diet and low fat, low cholesterol diet Wound Care: keep wound clean and dry  Follow-up with [facility] in 1 Month with ABI and LLE arterial duplex  Signed: Teretha Damme, PA-C 02/18/2024 9:38 AM [facility] Office: [phone]

## 2024-02-18 NOTE — Telephone Encounter (Signed)
 H&V Care Navigation CSW Progress Note  Clinical Social Worker contacted patient by phone to f/u on PCP appt. Was able to get him an appt on Monday 7/14 with Dr. Norleen to discuss acute needs and letter requested by DSS. Pt reached at [phone]. He inquires how he's going to get there. I shared that pt had previously talked to me and shared he drives himself to appts. Pt shares his concern is the cost of gas, he will be at the end of his social security then and isnt sure he'll have gas. Unfortunately we aren't able to arrange transportation to pt appt through our cost center but pt was encouraged to call his neighbor to assist with a ride. He also asks for his endocrinologists number as his medications were sent downstairs at [facility]. I dont see where his insulin  was sent to a [facility] pharmacy, recommended he call Dr. Kris office and sent him a text as requested with appt details (7/14 at 2pm for PCP) and endocrinology office number.  Patient is participating in a Managed Medicaid Plan:  No, [facility] Medicare  SDOH Screenings   Food Insecurity: Food Insecurity Present (02/17/2024)  Housing: High Risk (02/17/2024)  Transportation Needs: Unmet Transportation Needs (02/17/2024)  Utilities: At Risk (02/17/2024)  Alcohol Screen: Low Risk  (06/10/2023)  Depression (PHQ2-9): Low Risk  (06/10/2023)  Financial Resource Strain: Medium Risk (02/17/2024)  Physical Activity: Inactive (06/10/2023)  Social Connections: Socially Isolated (02/16/2024)  Stress: Stress Concern Present (02/17/2024)  Tobacco Use: High Risk (02/10/2024)  Health Literacy: Inadequate Health Literacy (02/17/2024)   [name], MSW, LCSW Clinical Social Worker II [facility] Heart/Vascular Care Navigation  [phone]- work cell phone (preferred)

## 2024-02-22 ENCOUNTER — Encounter: Payer: Self-pay | Admitting: Internal Medicine

## 2024-02-22 ENCOUNTER — Telehealth: Payer: Self-pay

## 2024-02-22 ENCOUNTER — Telehealth (HOSPITAL_BASED_OUTPATIENT_CLINIC_OR_DEPARTMENT_OTHER): Payer: Self-pay | Admitting: Licensed Clinical Social Worker

## 2024-02-22 ENCOUNTER — Ambulatory Visit

## 2024-02-22 ENCOUNTER — Encounter: Payer: Self-pay | Admitting: Podiatry

## 2024-02-22 ENCOUNTER — Ambulatory Visit (INDEPENDENT_AMBULATORY_CARE_PROVIDER_SITE_OTHER): Admitting: Podiatry

## 2024-02-22 ENCOUNTER — Ambulatory Visit (INDEPENDENT_AMBULATORY_CARE_PROVIDER_SITE_OTHER): Admitting: Internal Medicine

## 2024-02-22 VITALS — BP 150/80 | HR 87 | Temp 98.1°F | Ht 68.0 in

## 2024-02-22 DIAGNOSIS — I739 Peripheral vascular disease, unspecified: Secondary | ICD-10-CM

## 2024-02-22 DIAGNOSIS — E538 Deficiency of other specified B group vitamins: Secondary | ICD-10-CM

## 2024-02-22 DIAGNOSIS — N5089 Other specified disorders of the male genital organs: Secondary | ICD-10-CM | POA: Diagnosis not present

## 2024-02-22 DIAGNOSIS — L03032 Cellulitis of left toe: Secondary | ICD-10-CM

## 2024-02-22 DIAGNOSIS — L97522 Non-pressure chronic ulcer of other part of left foot with fat layer exposed: Secondary | ICD-10-CM | POA: Diagnosis not present

## 2024-02-22 DIAGNOSIS — E559 Vitamin D deficiency, unspecified: Secondary | ICD-10-CM | POA: Diagnosis not present

## 2024-02-22 DIAGNOSIS — Z794 Long term (current) use of insulin: Secondary | ICD-10-CM | POA: Diagnosis not present

## 2024-02-22 DIAGNOSIS — F4321 Adjustment disorder with depressed mood: Secondary | ICD-10-CM | POA: Insufficient documentation

## 2024-02-22 DIAGNOSIS — Z0000 Encounter for general adult medical examination without abnormal findings: Secondary | ICD-10-CM | POA: Diagnosis not present

## 2024-02-22 DIAGNOSIS — E1151 Type 2 diabetes mellitus with diabetic peripheral angiopathy without gangrene: Secondary | ICD-10-CM

## 2024-02-22 DIAGNOSIS — L02612 Cutaneous abscess of left foot: Secondary | ICD-10-CM

## 2024-02-22 DIAGNOSIS — Z125 Encounter for screening for malignant neoplasm of prostate: Secondary | ICD-10-CM

## 2024-02-22 DIAGNOSIS — Z0001 Encounter for general adult medical examination with abnormal findings: Secondary | ICD-10-CM

## 2024-02-22 LAB — URINALYSIS, ROUTINE W REFLEX MICROSCOPIC
Bilirubin Urine: NEGATIVE
Hgb urine dipstick: NEGATIVE
Ketones, ur: NEGATIVE
Leukocytes,Ua: NEGATIVE
Nitrite: NEGATIVE
RBC / HPF: NONE SEEN (ref 0–?)
Specific Gravity, Urine: 1.01 (ref 1.000–1.030)
Total Protein, Urine: NEGATIVE
Urine Glucose: >=1000 — AB
Urobilinogen, UA: 0.2 (ref 0.0–1.0)
pH: 6 (ref 5.0–8.0)

## 2024-02-22 LAB — BASIC METABOLIC PANEL WITH GFR
BUN: 18 mg/dL (ref 6–23)
CO2: 33 meq/L — ABNORMAL HIGH (ref 19–32)
Calcium: 9.3 mg/dL (ref 8.4–10.5)
Chloride: 90 meq/L — ABNORMAL LOW (ref 96–112)
Creatinine, Ser: 0.91 mg/dL (ref 0.40–1.50)
GFR: 87.29 mL/min (ref >60.00)
Glucose, Bld: 484 mg/dL — ABNORMAL HIGH (ref 70–99)
Potassium: 4 meq/L (ref 3.5–5.1)
Sodium: 130 meq/L — ABNORMAL LOW (ref 135–145)

## 2024-02-22 LAB — LIPID PANEL
Cholesterol: 115 mg/dL (ref 0–200)
HDL: 40.4 mg/dL (ref >39.00)
LDL Cholesterol: 42 mg/dL (ref 0–99)
NonHDL: 74.78
Total CHOL/HDL Ratio: 3
Triglycerides: 163 mg/dL — ABNORMAL HIGH (ref 0.0–149.0)
VLDL: 32.6 mg/dL (ref 0.0–40.0)

## 2024-02-22 LAB — CBC WITH DIFFERENTIAL/PLATELET
Basophils Absolute: 0.1 K/uL (ref 0.0–0.1)
Basophils Relative: 0.7 % (ref 0.0–3.0)
Eosinophils Absolute: 0.3 K/uL (ref 0.0–0.7)
Eosinophils Relative: 2.5 % (ref 0.0–5.0)
HCT: 43 % (ref 39.0–52.0)
Hemoglobin: 14.9 g/dL (ref 13.0–17.0)
Lymphocytes Relative: 13.8 % (ref 12.0–46.0)
Lymphs Abs: 1.8 K/uL (ref 0.7–4.0)
MCHC: 34.7 g/dL (ref 30.0–36.0)
MCV: 96 fl (ref 78.0–100.0)
Monocytes Absolute: 0.9 K/uL (ref 0.1–1.0)
Monocytes Relative: 7.3 % (ref 3.0–12.0)
Neutro Abs: 9.6 K/uL — ABNORMAL HIGH (ref 1.4–7.7)
Neutrophils Relative %: 75.7 % (ref 43.0–77.0)
Platelets: 357 K/uL (ref 150.0–400.0)
RBC: 4.48 Mil/uL (ref 4.22–5.81)
RDW: 13.4 % (ref 11.5–15.5)
WBC: 12.7 K/uL — ABNORMAL HIGH (ref 4.0–10.5)

## 2024-02-22 LAB — VITAMIN B12: Vitamin B-12: 414 pg/mL (ref 211–911)

## 2024-02-22 LAB — HEPATIC FUNCTION PANEL
ALT: 11 U/L (ref 0–53)
AST: 11 U/L (ref 0–37)
Albumin: 3.4 g/dL — ABNORMAL LOW (ref 3.5–5.2)
Alkaline Phosphatase: 91 U/L (ref 39–117)
Bilirubin, Direct: 0.2 mg/dL (ref 0.0–0.3)
Total Bilirubin: 0.7 mg/dL (ref 0.2–1.2)
Total Protein: 6.5 g/dL (ref 6.0–8.3)

## 2024-02-22 LAB — MICROALBUMIN / CREATININE URINE RATIO
Creatinine,U: 31.3 mg/dL
Microalb Creat Ratio: 116.1 mg/g — ABNORMAL HIGH (ref 0.0–30.0)
Microalb, Ur: 3.6 mg/dL — ABNORMAL HIGH (ref 0.0–1.9)

## 2024-02-22 LAB — TSH: TSH: 1.27 u[IU]/mL (ref 0.35–5.50)

## 2024-02-22 LAB — VITAMIN D 25 HYDROXY (VIT D DEFICIENCY, FRACTURES): VITD: 28.46 ng/mL — ABNORMAL LOW (ref 30.00–100.00)

## 2024-02-22 LAB — HEMOGLOBIN A1C: Hgb A1c MFr Bld: 11.2 % — ABNORMAL HIGH (ref 4.6–6.5)

## 2024-02-22 LAB — PSA: PSA: 2.35 ng/mL (ref 0.10–4.00)

## 2024-02-22 MED ORDER — CLOPIDOGREL BISULFATE 75 MG PO TABS: 75.0000 mg | ORAL_TABLET | Freq: Every day | ORAL | 11 refills | Status: DC

## 2024-02-22 MED ORDER — ATORVASTATIN CALCIUM 40 MG PO TABS: ORAL_TABLET | ORAL | 3 refills | Status: DC

## 2024-02-22 MED ORDER — IRBESARTAN 300 MG PO TABS: 300.0000 mg | ORAL_TABLET | Freq: Every day | ORAL | 3 refills | Status: DC

## 2024-02-22 MED ORDER — TOUJEO MAX SOLOSTAR 300 UNIT/ML ~~LOC~~ SOPN: 100.0000 [IU] | PEN_INJECTOR | SUBCUTANEOUS | 3 refills | Status: DC

## 2024-02-22 MED ORDER — PANTOPRAZOLE SODIUM 40 MG PO TBEC: 40.0000 mg | DELAYED_RELEASE_TABLET | Freq: Every day | ORAL | 2 refills | Status: DC

## 2024-02-22 MED ORDER — CITALOPRAM HYDROBROMIDE 20 MG PO TABS: 20.0000 mg | ORAL_TABLET | Freq: Every day | ORAL | 2 refills | Status: DC

## 2024-02-22 MED ORDER — CLINDAMYCIN HCL 300 MG PO CAPS: 300.0000 mg | ORAL_CAPSULE | Freq: Three times a day (TID) | ORAL | 0 refills | Status: DC

## 2024-02-22 NOTE — Telephone Encounter (Signed)
 Pt called reporting swollen left foot with a black toe and bubbling under my foot. Pt reported he's unable to walk on his foot. Pt reports he thinks it's infected. Pt waiting in the [facility] parking lot.  Pt advised to go to the ED. Pt verbally agreed.

## 2024-02-22 NOTE — Telephone Encounter (Signed)
 H&V Care Navigation CSW Progress Note  Clinical Social Worker contacted Asberry Rattler with [place] [employer] again this morning to better understand pt relationship with their team and any resources they have provided at this time. Also had made a PCP appt at pt request, and had noted that DSS had submitted request to PCP team previously. Wanted to let them know about appt, pt permission obtained. LCSW left voicemail for caseworker this morning at [phone].  Patient is participating in a Managed Medicaid Plan:  No, [employer] Medicare only  SDOH Screenings   Food Insecurity: Food Insecurity Present (02/17/2024)  Housing: High Risk (02/17/2024)  Transportation Needs: Unmet Transportation Needs (02/17/2024)  Utilities: At Risk (02/17/2024)  Alcohol Screen: Low Risk  (06/10/2023)  Depression (PHQ2-9): Low Risk  (06/10/2023)  Financial Resource Strain: Medium Risk (02/17/2024)  Physical Activity: Inactive (06/10/2023)  Social Connections: Socially Isolated (02/16/2024)  Stress: Stress Concern Present (02/17/2024)  Tobacco Use: High Risk (02/10/2024)  Health Literacy: Inadequate Health Literacy (02/17/2024)    [name], MSW, LCSW Clinical Social Worker II [facility] Health Heart/Vascular Care Navigation  [phone]- work cell phone (preferred)

## 2024-02-22 NOTE — Assessment & Plan Note (Signed)
 Lab Results  Component Value Date   HGBA1C 14.3 (H) 12/31/2023   Severe uncontrolled, for office pharmacist to assist with med adherence, , pt to continue current medical treatment toujeo  100 u qam

## 2024-02-22 NOTE — Assessment & Plan Note (Signed)
 Also for urology referral per pt request

## 2024-02-22 NOTE — Progress Notes (Signed)
 Patient presents today with complaint darkness on the distal aspect of the hallux left with some pain.  Was also concerned about some breakdown at the amputation site of the fifth toe left.  He had revascularization by Dr. Pearline on 02/15/2024.  He had stents placed .  No F/c or N/v   Physical exam:  General appearance: Pleasant, and in no acute distress. AOx3.  Vascular: Pedal pulses: DP 0/4 bilaterally, PT 2/4 bilaterally.  Moderate to severe edema lower legs right. Capillary fill time diminished.  Neurological: Grossly intact  Dermatologic:   Distal tip of the hallux is gangrenous with probable partial  mummification left.  Some breakdown at the amputation site of the fifth toe of the left.  Tissue proximal to the zone of gangrene appears viable.  Skin is thin and atrophic with no hair growth lower extremity.  From photos and medical record from vascular on 02/10/2024 distal part of the toe was already dusky   Musculoskeletal: Tenderness distal hallux left  Radiographs: 3 views foot left: Tapering of the distal phalanx of the hallux left however this is unchanged from radiographs taken  May 2025 and previous use.  No evidence of any osteomyelitis of the fifth metatarsal over the fifth digit was amputated.  No subcutaneous emphysema noted..  Diagnosis: 1.  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 2.  Some breakdown of the amputation site of fifth toe left.  Signs of infection  Plan: -Established office visit for evaluation and management level 3 - Discussed with him the revascularization of the left limb 1 week ago on 02/15/2024.  Told him that we will probably have to wait several weeks for the gangrenous area on the distal hallux to demarcate.  Need surgical intervention on this depending on how he recovers.  He can soak the foot in lukewarm salt water 15 minutes twice daily. -Asked at length with him that he needs to cease smoking.  Explained to him that he  continues to smoke his arteries will continue to clot and eventually vascular will run out of intervention options.  If that happens he would be looking at a amputation.  He also needs to get his diabetes under control his HA1C today it was last checked was greater than 14.   Return 2 w intervention eeks for follow-up ulcers left foot with Dr. Malvin for continuity of care and possible need for surgery

## 2024-02-22 NOTE — Telephone Encounter (Signed)
 H&V Care Navigation CSW Progress Note  Clinical Social Worker received a call back from DSS to clarify current role and needs. Caseworker is Hadassah [phone]), she has returned from maternity leave and shares that DSS does not have any open APS cases, pt had actually come to their team regarding need for representative payor to help him manages his finances. They are in need of letter noting why this is needed from PCP team. I shared that I had assisted pt with arranging an appt and he was going to try his best to get there today. Inquired if I could add her contact information to appt notes- she is agreeable. Remain available, will try and connect pt with additional resources for assistance as able for daily needs.  Patient is participating in a Managed Medicaid Plan:  No, [facility] Medicare  SDOH Screenings   Food Insecurity: Food Insecurity Present (02/17/2024)  Housing: High Risk (02/17/2024)  Transportation Needs: Unmet Transportation Needs (02/17/2024)  Utilities: At Risk (02/17/2024)  Alcohol Screen: Low Risk  (06/10/2023)  Depression (PHQ2-9): Low Risk  (06/10/2023)  Financial Resource Strain: Medium Risk (02/17/2024)  Physical Activity: Inactive (06/10/2023)  Social Connections: Socially Isolated (02/16/2024)  Stress: Stress Concern Present (02/17/2024)  Tobacco Use: High Risk (02/10/2024)  Health Literacy: Inadequate Health Literacy (02/17/2024)    [name], MSW, LCSW Clinical Social Worker II [facility] Health Heart/Vascular Care Navigation  [phone]- work cell phone (preferred)

## 2024-02-22 NOTE — Assessment & Plan Note (Signed)
Last vitamin D Lab Results  Component Value Date   VD25OH 47.62 01/13/2023   Stable, cont oral replacement

## 2024-02-22 NOTE — Assessment & Plan Note (Signed)
 Mod to severe but refuses ED referral; for cleocin  300 tid course

## 2024-02-22 NOTE — Assessment & Plan Note (Signed)
 Pt for f/u vascular surgury as planned

## 2024-02-22 NOTE — Assessment & Plan Note (Addendum)
 Age and sex appropriate education and counseling updated with regular exercise and diet Referrals for preventative services - declines prevnar 20 Immunizations addressed - for shingrix at pharmacy Smoking counseling  - none needed Evidence for depression or other mood disorder - none significant Most recent labs reviewed. I have personally reviewed and have noted: 1) the patient's medical and social history 2) The patient's current medications and supplements 3) The patient's height, weight, and BMI have been recorded in the chart

## 2024-02-22 NOTE — Progress Notes (Signed)
 Patient ID: [name], male   DOB: [birth year], 68 y.o.   MRN: [id number]         Chief Complaint:: wellness exam and medical necessity (Letter for financial management, medical necessity. FYI of recent toe amputation on left foot, possible infection, discoloration on big toe and pinky toe. Edema and discoloration in left leg. Patient hasn't been able to take insulin  medication. Patient has been without power since early may)  , dm, grief scrotal mass, left great toe cellulitis       HPI:  [name] is a 68 y.o. male here for wellness exam; for shingrix at pharmacy, o/w up to date, declines prevnar 20; needs social services form filled out for SDOH concerns                        Also has left scrotal mass increased in size, has seen urology but now larger and just wants it out.  C/o chronic persistent grief and living without electric.  Debanked it seems due to passing a bad check.  Pt denies chest pain, increased sob or doe, wheezing, orthopnea, PND, [name], palpitations, dizziness or syncope, except for left foot 2+ swelling of distal foot and erythema of the great toe without ulcer.  Adamantly refused ED referral today.  No high fever, chills, new ulcer or drainage.      Wt Readings from Last 3 Encounters:  02/15/24 160 lb (72.6 kg)  02/10/24 140 lb (63.5 kg)  01/02/24 140 lb 14 oz (63.9 kg)   BP Readings from Last 3 Encounters:  02/22/24 (!) 150/80  02/16/24 (!) 132/101  02/10/24 (!) 157/74   Immunization History  Administered Date(s) Administered   Fluad Quad(high Dose 65+) 04/21/2019, 04/03/2023   Influenza Split 07/07/2011, 06/08/2012   Influenza Whole 05/04/2008, 05/22/2009, 05/02/2010   Influenza,inj,Quad PF,6+ Mos 06/06/2013, 05/05/2014, 07/02/2015, 05/05/2016, 06/15/2018, 04/26/2020, 05/01/2021   PFIZER(Purple Top)SARS-COV-2 Vaccination 11/23/2019, 12/14/2019   Pneumococcal Polysaccharide-23 01/10/1996, 05/05/2016   Td 02/09/2000   Tdap 02/10/2011,  06/05/2016   Zoster Recombinant(Shingrix) 04/03/2023   Health Maintenance Due  Topic Date Due   Diabetic kidney evaluation - Urine ACR  07/10/2011   Lung Cancer Screening  08/01/2020   Zoster Vaccines- Shingrix (2 of 2) 05/29/2023      Past Medical History:  Diagnosis Date   Adenomatous colon polyp    Allergic rhinitis    At risk for sleep apnea    STOP-BANG= 5   SENT TO PCP 03-14-2014   Bladder cancer (HCC)    CAD (coronary artery disease)    a. 07/2014 low risk MV; b. 07/2019 Cor CTA (FFR): LM 25-49 (nl), LAD mild prox/mid plaque (nl), D1 25-49p(nl w/ abnl FFR of 0.73 in inf branch), LCX/OM1 mild prox/mid plaque (nl), RCA nondominant, minimal Ca2+ plaque (nl), RPDA (mildly abnl @ 0.79)-->Med Rx..   Cataract    surgically removed bilateral   Condyloma acuminatum of penis    Diabetic neuropathy (HCC)    Diastolic dysfunction    a. 05/2019 Echo: EF 60-65%, no rwam, mod LVH, impaired relaxation, nl RV size/fxn, trace MR, triv TR.   Diverticulosis    GERD (gastroesophageal reflux disease)    History of bladder cancer    s/p  turbt  2013/   transitional cell carcinoma--    History of condyloma acuminatum    PERINEAL AREA  W/ RECURRENCY   History of gout    Hyperlipidemia    Hypertension  Lower urinary tract symptoms (LUTS)    PAF (paroxysmal atrial fibrillation) (HCC)    a. 06/2019 Event monitor: PAF <1% burden. Longest 3 mins 36 secs.   Productive cough    PSVT (paroxysmal supraventricular tachycardia) (HCC)    a. 06/2019 Event monitor: 112 episodes of SVT, longest 21 secs.   PVD (peripheral vascular disease) with claudication (HCC)    a. 03/2014 LE art duplex: long segment occlusion of mid to distal R SFA; b. 03/2020 ABI: nl left and mildly improved R ABI->med rx.   Renal artery stenosis (HCC)    Renal artery stenosis (HCC)    a. 12/2020 Renal art duplex: RRA 1-59%, LRA >60%.   Smokers' cough (HCC)    Type 2 diabetes mellitus with insulin  therapy [facility]) 1992   monitor by   dr ellsion   Wears dentures    upper   Past Surgical History:  Procedure Laterality Date   ABDOMINAL AORTOGRAM W/LOWER EXTREMITY N/A 02/15/2024   Procedure: ABDOMINAL AORTOGRAM W/LOWER EXTREMITY;  Surgeon: Pearline Norman RAMAN, MD;  Location: [facility] INVASIVE CV LAB;  Service: Cardiovascular;  Laterality: N/A;   AMPUTATION Left 01/02/2024   Procedure: AMPUTATION, FOOT, RAY;  Surgeon: Malvin Marsa FALCON, DPM;  Location: [facility] OR;  Service: Orthopedics/Podiatry;  Laterality: Left;  FIFTH TOE   AXILLARY HIDRADENITIS EXCISION  1997   CARDIOVASCULAR STRESS TEST  07-24-2014  dr darron grass   Low risk lexiscan  nuclear study with apical thinning and small inferolateral wall infarct at mid & basal level , no ischemia/  normal LVF and wall motion , ef 59%   CATARACT EXTRACTION Left    CATARACT EXTRACTION W/ INTRAOCULAR LENS IMPLANT Right    CO2 LASER APPLICATION N/A 03/20/2014   Procedure: CO2 LASER APPLICATION,PENIS, GROIN, ANUS;  Surgeon: Elspeth KYM Schultze, MD;  Location: [facility];  Service: General;  Laterality: N/A;   CO2 LASER APPLICATION N/A 05/21/2015   Procedure: CO2 LASER APPLICATION;  Surgeon: Mark Ottelin, MD;  Location: [facility] [place];  Service: Urology;  Laterality: N/A;   CONDYLOMA EXCISION/FULGURATION N/A 05/21/2015   Procedure: CONDYLOMA REMOVAL;  Surgeon: Mark Ottelin, MD;  Location: [facility];  Service: Urology;  Laterality: N/A;   HEMORRHOID SURGERY  10/24/2014   Procedure: HEMORRHOIDECTOMY;  Surgeon: Elspeth Schultze, MD;  Location: [facility];  Service: General;;   INCISION AND DRAINAGE ABSCESS Left 10/24/2014   Procedure: INCISION AND DRAINAGE ABSCESS;  Surgeon: Elspeth Schultze, MD;  Location: [facility] [place];  Service: General;  Laterality: Left;   INGUINAL HIDRADENITIS EXCISION  1998, 1999   LASER ABLATION CONDOLAMATA N/A 03/20/2014   Procedure: EXAM UNDER ANESTHESIA, REMOVAL/ABLATION OF CONDYLOMATA PENIS,GROINS, ANUS,  ANAL CANAL;  Surgeon: Elspeth KYM Schultze, MD;  Location: [facility];  Service: General;  Laterality: N/A;  groin and anus   LASER ABLATION CONDOLAMATA N/A 10/24/2014   Procedure: LASER ABLATION CONDOLAMATA;  Surgeon: Elspeth Schultze, MD;  Location: [facility];  Service: General;  Laterality: N/A;   LASER ABLATION OF PENILE AND PERIANAL WARTS  07-29-2007  Dr. Schultze   LEFT SHOULDER SURGERY  2003   LOWER EXTREMITY ANGIOGRAPHY N/A 01/01/2024   Procedure: Lower Extremity Angiography;  Surgeon: Sheree Penne Bruckner, MD;  Location: [facility] INVASIVE CV LAB;  Service: Vascular;  Laterality: N/A;   LOWER EXTREMITY ANGIOGRAPHY N/A 02/15/2024   Procedure: Lower Extremity Angiography;  Surgeon: Pearline Norman RAMAN, MD;  Location: [facility] INVASIVE CV LAB;  Service: Cardiovascular;  Laterality: N/A;  LOWER EXTREMITY INTERVENTION  01/01/2024   Procedure: LOWER EXTREMITY INTERVENTION;  Surgeon: Sheree Penne Bruckner, MD;  Location: [facility] INVASIVE CV LAB;  Service: Vascular;;   LOWER EXTREMITY INTERVENTION N/A 02/15/2024   Procedure: LOWER EXTREMITY INTERVENTION;  Surgeon: Pearline Norman RAMAN, MD;  Location: [facility] INVASIVE CV LAB;  Service: Cardiovascular;  Laterality: N/A;   MASS EXCISION N/A 10/24/2014   Procedure: EXCISION OF PERINEAL MASS/SINUS;  Surgeon: Elspeth Schultze, MD;  Location: [facility];  Service: General;  Laterality: N/A;   MOHS SURGERY     back   MOHS SURGERY  2017   face   MULTIPLE TOOTH EXTRACTIONS     PERINEAL HIDRADENITIS EXCISION  1998, 1999   TRANSURETHRAL RESECTION OF BLADDER TUMOR  05/21/2012   Procedure: TRANSURETHRAL RESECTION OF BLADDER TUMOR (TURBT);  Surgeon: Mark C Ottelin, MD;  Location: [facility];  Service: Urology;  Laterality: N/A;       reports that he has been smoking cigars and cigarettes. He started smoking about 49 years ago. He has a 41 pack-year smoking history. He has never used smokeless tobacco. He reports that he does not drink  alcohol and does not use drugs. family history includes Diabetes in his maternal aunt; Hypertension in his mother; Lung cancer in his father. Allergies  Allergen Reactions   Cefepime  Hives    Had allergic reaction to one of these 3 agents, unclear which one   Metoprolol  Hives    Had allergic reaction to one of these 3 agents, unclear which one  *Per RN, highly likely this is the cause of allergic rxn   Myrbetriq  [Mirabegron ] Hives    Had allergic reaction to one of these 3 agents, unclear which one   Current Outpatient Medications on File Prior to Visit  Medication Sig Dispense Refill   aspirin  EC 81 MG tablet Take 1 tablet (81 mg total) by mouth daily. Swallow whole. (Patient not taking: Reported on 02/22/2024) 30 tablet 12   Blood Glucose Monitoring Suppl (ONE TOUCH ULTRA 2) w/Device KIT Use as directed to check blood sugars twice a day (Patient not taking: Reported on 02/22/2024) 1 kit 0   Lancets (ONETOUCH DELICA PLUS LANCET30G) MISC CHECK BLOOD SUGAR TWICE DAILY (Patient not taking: Reported on 02/22/2024) 200 each 2   loratadine  (CLARITIN ) 10 MG tablet Take 10 mg by mouth daily as needed. (Patient not taking: Reported on 02/22/2024)     ONETOUCH ULTRA test strip CHECK BLOOD SUGAR TWICE DAILY (Patient not taking: Reported on 02/22/2024) 200 strip 3   oxyCODONE  (OXY IR/ROXICODONE ) 5 MG immediate release tablet Take 1 tablet (5 mg total) by mouth every 4 (four) hours as needed for severe pain (pain score 7-10). (Patient not taking: Reported on 02/22/2024) 10 tablet 0   No current facility-administered medications on file prior to visit.        ROS:  All others reviewed and negative.  Objective        PE:  BP (!) 150/80   Pulse 87   Temp 98.1 F (36.7 C)   Ht 5' 8 (1.727 m)   SpO2 95%   BMI 24.33 kg/m                 Constitutional: Pt appears in NAD               HENT: Head: NCAT.                Right Ear: External ear normal.  Left Ear: External ear normal.                 Eyes: . Pupils are equal, round, and reactive to light. Conjunctivae and EOM are normal               Nose: without d/c or deformity               Neck: Neck supple. Gross normal ROM               Cardiovascular: Normal rate and regular rhythm.                 Pulmonary/Chest: Effort normal and breath sounds without rales or wheezing.                Abd:  Soft, NT, ND, + BS, no organomegaly               Neurological: Pt is alert. At baseline orientation, motor grossly intact; dorsalis pedis 1+ LLE               Skin: Skin is warm. LE edema - none; left great toe with 1+ erythema tender swelling with swelling to left distal foot as well               Psychiatric: Pt behavior is normal without agitation   Micro: none  Cardiac tracings I have personally interpreted today:  none  Pertinent Radiological findings (summarize): none   Lab Results  Component Value Date   WBC 10.7 (H) 01/02/2024   HGB 14.3 02/15/2024   HCT 42.0 02/15/2024   PLT 260 01/02/2024   GLUCOSE 396 (H) 02/15/2024   CHOL 137 02/16/2024   TRIG 111 02/16/2024   HDL 36 (L) 02/16/2024   LDLDIRECT 54.0 05/01/2021   LDLCALC 79 02/16/2024   ALT 14 01/02/2024   AST 23 01/02/2024   NA 133 (L) 02/15/2024   K 3.0 (L) 02/15/2024   CL 91 (L) 02/15/2024   CREATININE 0.80 02/15/2024   BUN 6 (L) 02/15/2024   CO2 28 01/02/2024   TSH 1.30 01/13/2023   PSA 2.04 04/01/2022   INR 1.1 06/04/2019   HGBA1C 14.3 (H) 12/31/2023   MICROALBUR 1.5 07/09/2010   Assessment/Plan:  [name] is a 68 y.o. White or Caucasian [1] male with  has a past medical history of Adenomatous colon polyp, Allergic rhinitis, At risk for sleep apnea, Bladder cancer (HCC), CAD (coronary artery disease), Cataract, Condyloma acuminatum of penis, Diabetic neuropathy (HCC), Diastolic dysfunction, Diverticulosis, GERD (gastroesophageal reflux disease), History of bladder cancer, History of condyloma acuminatum, History of gout, Hyperlipidemia,  Hypertension, Lower urinary tract symptoms (LUTS), PAF (paroxysmal atrial fibrillation) (HCC), Productive cough, PSVT (paroxysmal supraventricular tachycardia) (HCC), PVD (peripheral vascular disease) with claudication (HCC), Renal artery stenosis (HCC), Renal artery stenosis (HCC), Smokers' cough (HCC), Type 2 diabetes mellitus with insulin  therapy (HCC) (1992), and Wears dentures.  Encounter for well adult exam with abnormal findings Age and sex appropriate education and counseling updated with regular exercise and diet Referrals for preventative services - declines prevnar 20 Immunizations addressed - for shingrix at pharmacy Smoking counseling  - none needed Evidence for depression or other mood disorder - none significant Most recent labs reviewed. I have personally reviewed and have noted: 1) the patient's medical and social history 2) The patient's current medications and supplements 3) The patient's height, weight, and BMI have been recorded in the chart   Vitamin D  deficiency Last vitamin D  Lab  Results  Component Value Date   VD25OH 47.62 01/13/2023   Stable, cont oral replacement   PAD (peripheral artery disease) (HCC) Pt for f/u vascular surgury as planned  Diabetes [facility]) Lab Results  Component Value Date   HGBA1C 14.3 (H) 12/31/2023   Severe uncontrolled, for office pharmacist to assist with med adherence, , pt to continue current medical treatment toujeo  100 u qam   Cellulitis of great toe of left foot Mod to severe but refuses ED referral; for cleocin  300 tid course  Scrotal mass Also for urology referral per pt request  Grief Severe persistent, declines psychiatry or counseling referrals  Followup: Return in about 6 months (around 08/24/2024).  Lynwood Rush, MD 02/22/2024 2:59 PM [facility] [place] [facility] Internal Medicine

## 2024-02-22 NOTE — Assessment & Plan Note (Signed)
 Severe persistent, declines psychiatry or counseling referrals

## 2024-02-22 NOTE — Patient Instructions (Addendum)
 Please take all new medication as prescribed - the antibiotic  Please continue all other medications as before, and refills have been done for all of the routine medications to restart  Please have the pharmacy call with any other refills you may need.  Please continue your efforts at being more active, low cholesterol diet, and weight control.  You are otherwise up to date with prevention measures today.  Please keep your appointments with your specialists as you may have planned  You will be contacted regarding the referral for: urology  Please go to the LAB at the blood drawing area for the tests to be done  You will be contacted by phone if any changes need to be made immediately.  Otherwise, you will receive a letter about your results with an explanation, but please check with MyChart first.  Please make an Appointment to return in 6 months, or sooner if needed

## 2024-02-23 ENCOUNTER — Telehealth: Payer: Self-pay | Admitting: Licensed Clinical Social Worker

## 2024-02-23 ENCOUNTER — Ambulatory Visit: Payer: Self-pay | Admitting: Internal Medicine

## 2024-02-23 ENCOUNTER — Other Ambulatory Visit: Payer: Self-pay | Admitting: Internal Medicine

## 2024-02-23 DIAGNOSIS — Z794 Long term (current) use of insulin: Secondary | ICD-10-CM

## 2024-02-23 NOTE — Telephone Encounter (Addendum)
 H&V Care Navigation CSW Progress Note  Clinical Social Worker contacted patient by phone to f/u on resources for community and letter for DSS. Pt answered at [phone], he was driving. I will call again later today.  I was able to reach him again later this afternoon twice. Once he was having challenges with getting into [employer] to get his medications (wanted a wheelchair which someone was eventually able to bring out to him). He inquires if he can send me a picture of his foot, I recommended if any issues that he contact [facility] or Podiatry, noted that he was advised to go to ED for any concerns. Pt states understanding. He asks if he can receive his wife's social security as a widow. I shared that he should contact Social Security directly with any questions about his benefits but since his wife was not receiving more income than he was we discussed his income may not change.   The third time I spoke with pt we discussed I would send food resources and information about other community resources [facility]). Pt agreeable, stated he is in [place] didn't want to head home with his medications yet, planned to go get food from his church who is doing a distribution today. He had asked Dr. Norleen about letter but he didn't know what I was talking about. Shared I would reach out to CMA on chart and see if could provide her with DSS caseworkers name and number for more clarity. No additional questions at this time.   Patient is participating in a Managed Medicaid Plan:  No, [facility] Medicare  SDOH Screenings   Food Insecurity: Food Insecurity Present (02/17/2024)  Housing: High Risk (02/17/2024)  Transportation Needs: Unmet Transportation Needs (02/17/2024)  Utilities: At Risk (02/17/2024)  Alcohol Screen: Low Risk  (06/10/2023)  Depression (PHQ2-9): Low Risk  (02/22/2024)  Financial Resource Strain: Medium Risk (02/17/2024)  Physical Activity: Inactive (06/10/2023)  Social Connections:  Socially Isolated (02/16/2024)  Stress: Stress Concern Present (02/17/2024)  Tobacco Use: High Risk (02/22/2024)  Health Literacy: Inadequate Health Literacy (02/17/2024)    [name], MSW, LCSW Clinical Social Worker II [facility] Heart/Vascular Care Navigation  [phone]- work cell phone (preferred)

## 2024-02-24 ENCOUNTER — Telehealth: Payer: Self-pay

## 2024-02-24 NOTE — Telephone Encounter (Signed)
 Letter printed and mailed.

## 2024-02-24 NOTE — Progress Notes (Signed)
 Care Guide Pharmacy Note  02/24/2024 Name: AEDEN MATRANGA MRN: [id number] DOB: [birth year]  Referred By: Norleen Lynwood LELON, MD Reason for referral: Complex Care Management (Outreach to schedule with Pharm d )   TAICHI REPKA is a 68 y.o. year old male who is a primary care patient of Norleen Lynwood LELON, MD.  ANSLEY STANWOOD was referred to the pharmacist for assistance related to: DMII  Successful contact was made with the patient to discuss pharmacy services including being ready for the pharmacist to call at least 5 minutes before the scheduled appointment time and to have medication bottles and any blood pressure readings ready for review. The patient agreed to meet with the pharmacist via telephone visit on (date/time).02/25/2024  Jeoffrey Buffalo , RMA     [place]  [facility] Guide  Direct Dial: [phone]  Website: [email or web address]

## 2024-02-25 ENCOUNTER — Telehealth: Payer: Self-pay | Admitting: Licensed Clinical Social Worker

## 2024-02-25 ENCOUNTER — Telehealth: Payer: Self-pay | Admitting: Pharmacist

## 2024-02-25 ENCOUNTER — Other Ambulatory Visit (INDEPENDENT_AMBULATORY_CARE_PROVIDER_SITE_OTHER): Admitting: Pharmacist

## 2024-02-25 DIAGNOSIS — E1151 Type 2 diabetes mellitus with diabetic peripheral angiopathy without gangrene: Secondary | ICD-10-CM

## 2024-02-25 DIAGNOSIS — Z794 Long term (current) use of insulin: Secondary | ICD-10-CM

## 2024-02-25 NOTE — Progress Notes (Signed)
   02/25/2024 Name: [name] MRN: [id number] DOB: Jan 28, 1956  Chief Complaint  Patient presents with   Diabetes   Medication Management   Medication Access    [name] is a 68 y.o. year old male who presented for a telephone visit.   They were referred to the pharmacist by their PCP for assistance in managing diabetes and medication access.   Subjective:  Care Team: Primary Care Provider: Norleen Lynwood LELON, MD ; Next Scheduled Visit: none scheduled   Medication Access/Adherence  Current Pharmacy:  [facility] [address] [place] - [address] [facility] [place] [address] Phone: [phone] Fax: [phone]  [facility]) - [place], [place] - [address] [facility] [address] [address] [place] [address] [place] [address] Phone: [phone] Fax: [phone]  [facility] Delivery - [place], [place] - [address] [place] [address] Phone: [phone] Fax: [phone]  [place] [facility] [address] [place] [address] Phone: [phone] Fax: [phone]   Patient reports affordability concerns with their medications: Yes  Patient reports access/transportation concerns to their pharmacy: No  Patient reports adherence concerns with their medications:  Yes     Diabetes:  Current medications: Toujeo  (not currently taking - couldn't afford at the pharmacy $75) Medications tried in the past: Trulicity , Lantus , metformin , Novolin 70/30  Pt has been having significant financial difficulties and is unable to afford insulin  at this time  Objective:  Lab Results  Component Value Date   HGBA1C 11.2 (H) 02/22/2024    Lab Results  Component Value Date   CREATININE 0.91 02/22/2024   BUN 18 02/22/2024   NA 130 (L) 02/22/2024   K 4.0 02/22/2024   CL 90 (L) 02/22/2024   CO2 33 (H) 02/22/2024    Lab Results  Component  Value Date   CHOL 115 02/22/2024   HDL 40.40 02/22/2024   LDLCALC 42 02/22/2024   LDLDIRECT 54.0 05/01/2021   TRIG 163.0 (H) 02/22/2024   CHOLHDL 3 02/22/2024    Medications Reviewed Today   Medications were not reviewed in this encounter       Assessment/Plan:   Diabetes: - Currently uncontrolled, A1c goal <7% - Collaborated with LCSW. Pt is needing medical letter for DSS to have someone take over as representative payee. Called Maris at [employer] twice, left voicemail. Typed up letter and faxed to DSS for patient to be able to get help financially. - Checked for long acting insulin  samples - we do not currently have any - He qualifies for ExtraHelp which will lower medication costs. Has been referred to [facility] to assist with application process   Follow Up Plan: 1-2 weeks  Darrelyn Drum, PharmD, BCPS, CPP Clinical Pharmacist Practitioner [place] [facility] at [facility] [phone]

## 2024-02-25 NOTE — Telephone Encounter (Signed)
 Called patient for 9:30 AM pharmacist telephone appointment. No answer x3, left message x2 with direct call back number.  Per conversation with social worker Reggie), it seems patient should qualify for ExtraHelp for his medication cost.  Darrelyn Drum, PharmD, BCPS, CPP Clinical Pharmacist Practitioner [place] [facility] at [facility] [phone]

## 2024-02-25 NOTE — Telephone Encounter (Signed)
 H&V Care Navigation CSW Progress Note  Clinical Social Worker coordinated with Cloretta Landy Stains PCP pharmacist Cristy to refer pt for Extra Help assistance as pt having challenges with medications and cost. LCSW sent pt name and contact number to [facility] coordinators [place] and [place] to see if they would be able to assist pt with completing this application. Cristy also f/u with Dr. Norleen regarding letter from office for DSS.   Remain available for ongoing assistance.   Patient is participating in a Managed Medicaid Plan:  No, [employer] only  SDOH Screenings   Food Insecurity: Food Insecurity Present (02/17/2024)  Housing: High Risk (02/17/2024)  Transportation Needs: Unmet Transportation Needs (02/17/2024)  Utilities: At Risk (02/17/2024)  Alcohol Screen: Low Risk  (06/10/2023)  Depression (PHQ2-9): Low Risk  (02/22/2024)  Financial Resource Strain: Medium Risk (02/17/2024)  Physical Activity: Inactive (06/10/2023)  Social Connections: Socially Isolated (02/16/2024)  Stress: Stress Concern Present (02/17/2024)  Tobacco Use: High Risk (02/22/2024)  Health Literacy: Inadequate Health Literacy (02/17/2024)    [name], MSW, LCSW Clinical Social Worker II [name] Heart/Vascular Care Navigation  [phone]- work cell phone (preferred)

## 2024-02-26 ENCOUNTER — Telehealth: Payer: Self-pay | Admitting: Pharmacist

## 2024-02-27 ENCOUNTER — Inpatient Hospital Stay (HOSPITAL_COMMUNITY)
Admission: EM | Admit: 2024-02-27 | Discharge: 2024-03-04 | DRG: 617 | Disposition: A | Discharge status: Home / Self Care | Attending: Family Medicine | Admitting: Family Medicine

## 2024-02-27 ENCOUNTER — Emergency Department (HOSPITAL_COMMUNITY)

## 2024-02-27 DIAGNOSIS — E11628 Type 2 diabetes mellitus with other skin complications: Secondary | ICD-10-CM | POA: Diagnosis not present

## 2024-02-27 DIAGNOSIS — Z5941 Food insecurity: Secondary | ICD-10-CM

## 2024-02-27 DIAGNOSIS — I70269 Atherosclerosis of native arteries of extremities with gangrene, unspecified extremity: Secondary | ICD-10-CM | POA: Diagnosis present

## 2024-02-27 DIAGNOSIS — F17210 Nicotine dependence, cigarettes, uncomplicated: Secondary | ICD-10-CM | POA: Diagnosis not present

## 2024-02-27 DIAGNOSIS — I2510 Atherosclerotic heart disease of native coronary artery without angina pectoris: Secondary | ICD-10-CM | POA: Diagnosis not present

## 2024-02-27 DIAGNOSIS — Z9889 Other specified postprocedural states: Secondary | ICD-10-CM | POA: Diagnosis not present

## 2024-02-27 DIAGNOSIS — I70262 Atherosclerosis of native arteries of extremities with gangrene, left leg: Secondary | ICD-10-CM | POA: Diagnosis present

## 2024-02-27 DIAGNOSIS — R627 Adult failure to thrive: Secondary | ICD-10-CM | POA: Diagnosis present

## 2024-02-27 DIAGNOSIS — Z5971 Insufficient health insurance coverage: Secondary | ICD-10-CM

## 2024-02-27 DIAGNOSIS — I480 Paroxysmal atrial fibrillation: Secondary | ICD-10-CM | POA: Diagnosis present

## 2024-02-27 DIAGNOSIS — Z5986 Financial insecurity: Secondary | ICD-10-CM

## 2024-02-27 DIAGNOSIS — R609 Edema, unspecified: Secondary | ICD-10-CM | POA: Diagnosis not present

## 2024-02-27 DIAGNOSIS — E871 Hypo-osmolality and hyponatremia: Secondary | ICD-10-CM | POA: Diagnosis present

## 2024-02-27 DIAGNOSIS — Z8249 Family history of ischemic heart disease and other diseases of the circulatory system: Secondary | ICD-10-CM

## 2024-02-27 DIAGNOSIS — E1169 Type 2 diabetes mellitus with other specified complication: Principal | ICD-10-CM | POA: Diagnosis present

## 2024-02-27 DIAGNOSIS — I96 Gangrene, not elsewhere classified: Secondary | ICD-10-CM | POA: Diagnosis not present

## 2024-02-27 DIAGNOSIS — Z91148 Patient's other noncompliance with medication regimen for other reason: Secondary | ICD-10-CM

## 2024-02-27 DIAGNOSIS — L97422 Non-pressure chronic ulcer of left heel and midfoot with fat layer exposed: Secondary | ICD-10-CM | POA: Diagnosis not present

## 2024-02-27 DIAGNOSIS — Z794 Long term (current) use of insulin: Secondary | ICD-10-CM

## 2024-02-27 DIAGNOSIS — Z89422 Acquired absence of other left toe(s): Secondary | ICD-10-CM | POA: Diagnosis not present

## 2024-02-27 DIAGNOSIS — F39 Unspecified mood [affective] disorder: Secondary | ICD-10-CM | POA: Diagnosis present

## 2024-02-27 DIAGNOSIS — K219 Gastro-esophageal reflux disease without esophagitis: Secondary | ICD-10-CM | POA: Diagnosis present

## 2024-02-27 DIAGNOSIS — L03119 Cellulitis of unspecified part of limb: Secondary | ICD-10-CM | POA: Diagnosis not present

## 2024-02-27 DIAGNOSIS — E1165 Type 2 diabetes mellitus with hyperglycemia: Secondary | ICD-10-CM | POA: Diagnosis not present

## 2024-02-27 DIAGNOSIS — M7732 Calcaneal spur, left foot: Secondary | ICD-10-CM | POA: Diagnosis not present

## 2024-02-27 DIAGNOSIS — E1140 Type 2 diabetes mellitus with diabetic neuropathy, unspecified: Secondary | ICD-10-CM | POA: Diagnosis present

## 2024-02-27 DIAGNOSIS — I10 Essential (primary) hypertension: Secondary | ICD-10-CM | POA: Diagnosis not present

## 2024-02-27 DIAGNOSIS — E1151 Type 2 diabetes mellitus with diabetic peripheral angiopathy without gangrene: Secondary | ICD-10-CM

## 2024-02-27 DIAGNOSIS — M869 Osteomyelitis, unspecified: Secondary | ICD-10-CM | POA: Diagnosis not present

## 2024-02-27 DIAGNOSIS — S81802A Unspecified open wound, left lower leg, initial encounter: Secondary | ICD-10-CM | POA: Diagnosis not present

## 2024-02-27 DIAGNOSIS — R739 Hyperglycemia, unspecified: Secondary | ICD-10-CM | POA: Diagnosis not present

## 2024-02-27 DIAGNOSIS — L03032 Cellulitis of left toe: Secondary | ICD-10-CM | POA: Diagnosis not present

## 2024-02-27 DIAGNOSIS — Z833 Family history of diabetes mellitus: Secondary | ICD-10-CM

## 2024-02-27 DIAGNOSIS — E785 Hyperlipidemia, unspecified: Secondary | ICD-10-CM | POA: Diagnosis not present

## 2024-02-27 DIAGNOSIS — L97529 Non-pressure chronic ulcer of other part of left foot with unspecified severity: Secondary | ICD-10-CM | POA: Diagnosis not present

## 2024-02-27 DIAGNOSIS — M79673 Pain in unspecified foot: Secondary | ICD-10-CM | POA: Diagnosis not present

## 2024-02-27 DIAGNOSIS — Z7982 Long term (current) use of aspirin: Secondary | ICD-10-CM

## 2024-02-27 DIAGNOSIS — Z79899 Other long term (current) drug therapy: Secondary | ICD-10-CM | POA: Diagnosis not present

## 2024-02-27 DIAGNOSIS — Z801 Family history of malignant neoplasm of trachea, bronchus and lung: Secondary | ICD-10-CM

## 2024-02-27 DIAGNOSIS — Z888 Allergy status to other drugs, medicaments and biological substances status: Secondary | ICD-10-CM

## 2024-02-27 DIAGNOSIS — E11621 Type 2 diabetes mellitus with foot ulcer: Secondary | ICD-10-CM | POA: Diagnosis not present

## 2024-02-27 DIAGNOSIS — L03116 Cellulitis of left lower limb: Secondary | ICD-10-CM | POA: Diagnosis present

## 2024-02-27 DIAGNOSIS — M86172 Other acute osteomyelitis, left ankle and foot: Secondary | ICD-10-CM | POA: Diagnosis not present

## 2024-02-27 DIAGNOSIS — Z8551 Personal history of malignant neoplasm of bladder: Secondary | ICD-10-CM

## 2024-02-27 DIAGNOSIS — Z7902 Long term (current) use of antithrombotics/antiplatelets: Secondary | ICD-10-CM

## 2024-02-27 DIAGNOSIS — Z95820 Peripheral vascular angioplasty status with implants and grafts: Secondary | ICD-10-CM | POA: Diagnosis not present

## 2024-02-27 DIAGNOSIS — I739 Peripheral vascular disease, unspecified: Secondary | ICD-10-CM | POA: Diagnosis not present

## 2024-02-27 DIAGNOSIS — M62262 Nontraumatic ischemic infarction of muscle, left lower leg: Secondary | ICD-10-CM | POA: Diagnosis not present

## 2024-02-27 DIAGNOSIS — Z5982 Transportation insecurity: Secondary | ICD-10-CM

## 2024-02-27 DIAGNOSIS — Z604 Social exclusion and rejection: Secondary | ICD-10-CM | POA: Diagnosis present

## 2024-02-27 DIAGNOSIS — E1152 Type 2 diabetes mellitus with diabetic peripheral angiopathy with gangrene: Secondary | ICD-10-CM | POA: Diagnosis not present

## 2024-02-27 DIAGNOSIS — Z881 Allergy status to other antibiotic agents status: Secondary | ICD-10-CM

## 2024-02-27 DIAGNOSIS — Z95828 Presence of other vascular implants and grafts: Secondary | ICD-10-CM | POA: Diagnosis not present

## 2024-02-27 DIAGNOSIS — L97522 Non-pressure chronic ulcer of other part of left foot with fat layer exposed: Secondary | ICD-10-CM

## 2024-02-27 DIAGNOSIS — E119 Type 2 diabetes mellitus without complications: Secondary | ICD-10-CM

## 2024-02-27 LAB — CBG MONITORING, ED
Glucose-Capillary: 580 mg/dL (ref 70–99)
Glucose-Capillary: 502 mg/dL (ref 70–99)
Glucose-Capillary: 169 mg/dL — ABNORMAL HIGH (ref 70–99)

## 2024-02-27 LAB — I-STAT VENOUS BLOOD GAS, ED
Acid-Base Excess: 4 mmol/L — ABNORMAL HIGH (ref 0.0–2.0)
Bicarbonate: 29.7 mmol/L — ABNORMAL HIGH (ref 20.0–28.0)
Calcium, Ion: 1.21 mmol/L (ref 1.15–1.40)
HCT: 42 % (ref 39.0–52.0)
Hemoglobin: 14.3 g/dL (ref 13.0–17.0)
O2 Saturation: 36 %
Potassium: 3.9 mmol/L (ref 3.5–5.1)
Sodium: 128 mmol/L — ABNORMAL LOW (ref 135–145)
TCO2: 31 mmol/L (ref 22–32)
pCO2, Ven: 46.8 mmHg (ref 44–60)
pH, Ven: 7.41 (ref 7.25–7.43)
pO2, Ven: 22 mmHg — CL (ref 32–45)

## 2024-02-27 LAB — COMPREHENSIVE METABOLIC PANEL WITH GFR
ALT: 13 U/L (ref 0–44)
AST: 20 U/L (ref 15–41)
Albumin: 2.7 g/dL — ABNORMAL LOW (ref 3.5–5.0)
Alkaline Phosphatase: 98 U/L (ref 38–126)
Anion gap: 12 (ref 5–15)
BUN: 10 mg/dL (ref 8–23)
CO2: 24 mmol/L (ref 22–32)
Calcium: 8.8 mg/dL — ABNORMAL LOW (ref 8.9–10.3)
Chloride: 91 mmol/L — ABNORMAL LOW (ref 98–111)
Creatinine, Ser: 0.98 mg/dL (ref 0.61–1.24)
GFR, Estimated: >60 mL/min (ref >60)
Glucose, Bld: 558 mg/dL (ref 70–99)
Potassium: 3.9 mmol/L (ref 3.5–5.1)
Sodium: 127 mmol/L — ABNORMAL LOW (ref 135–145)
Total Bilirubin: 1.1 mg/dL (ref 0.0–1.2)
Total Protein: 6.9 g/dL (ref 6.5–8.1)

## 2024-02-27 LAB — URINALYSIS, ROUTINE W REFLEX MICROSCOPIC
Bacteria, UA: NONE SEEN
Bilirubin Urine: NEGATIVE
Glucose, UA: >=500 mg/dL — AB
Hgb urine dipstick: NEGATIVE
Ketones, ur: NEGATIVE mg/dL
Nitrite: NEGATIVE
Protein, ur: NEGATIVE mg/dL
Specific Gravity, Urine: 1.027 (ref 1.005–1.030)
pH: 5 (ref 5.0–8.0)

## 2024-02-27 LAB — CBC
HCT: 41.1 % (ref 39.0–52.0)
Hemoglobin: 14.7 g/dL (ref 13.0–17.0)
MCH: 33.3 pg (ref 26.0–34.0)
MCHC: 35.8 g/dL (ref 30.0–36.0)
MCV: 93 fL (ref 80.0–100.0)
Platelets: 272 K/uL (ref 150–400)
RBC: 4.42 MIL/uL (ref 4.22–5.81)
RDW: 12.3 % (ref 11.5–15.5)
WBC: 17.5 K/uL — ABNORMAL HIGH (ref 4.0–10.5)
nRBC: 0 % (ref 0.0–0.2)

## 2024-02-27 MED ORDER — SODIUM CHLORIDE 0.9 % IV BOLUS
1000.0000 mL | Freq: Once | INTRAVENOUS | Status: AC
Start: 2024-02-27 — End: 2024-02-27
  Administered 2024-02-27: 1000 mL via INTRAVENOUS

## 2024-02-27 MED ORDER — OXYCODONE HCL 5 MG PO TABS
5.0000 mg | ORAL_TABLET | Freq: Once | ORAL | Status: AC
  Administered 2024-02-27: 5 mg via ORAL
  Filled 2024-02-27: qty 1

## 2024-02-27 MED ORDER — VANCOMYCIN HCL 1500 MG/300ML IV SOLN
1500.0000 mg | Freq: Once | INTRAVENOUS | Status: AC
  Administered 2024-02-27: 1500 mg via INTRAVENOUS
  Filled 2024-02-27: qty 300

## 2024-02-27 MED ORDER — PIPERACILLIN-TAZOBACTAM 3.375 G IVPB 30 MIN
3.3750 g | Freq: Once | INTRAVENOUS | Status: AC
  Administered 2024-02-27: 3.375 g via INTRAVENOUS
  Filled 2024-02-27: qty 50

## 2024-02-27 MED ORDER — INSULIN ASPART 100 UNIT/ML IJ SOLN
10.0000 [IU] | Freq: Once | INTRAMUSCULAR | Status: AC
  Administered 2024-02-27: 10 [IU] via SUBCUTANEOUS

## 2024-02-27 NOTE — H&P (Incomplete)
 History and Physical    [name] FMW:[id number] DOB: 20-Jan-1956 DOA: 02/27/2024  PCP: Norleen Lynwood LELON, MD  Patient coming from: Home  Chief Complaint: Necrotic left great toe  HPI: [name] is a 68 y.o. male with medical history significant of CAD, PAF, PSVT, PAD, renal artery stenosis, insulin -dependent type 2 diabetes, GERD, gout, hypertension, hyperlipidemia, bladder cancer status post TURBT in 2013, tobacco abuse.  He was admitted 5/20-5/24/2025 for left fifth toe cellulitis/osteomyelitis and underwent toe amputation.  Noted to have abnormal ABIs during this hospitalization and underwent angiogram with left SFA stenting on 5/23.  Subsequently seen by vascular surgery on 7/2 for worsening discoloration of his left foot and changes to his left great toe.  His noninvasive studies demonstrated a severely depressed ABI with an occluded left SFA stent on duplex.  He underwent repeat stenting of left SFA on 7/7.  Patient returns to the ED today for evaluation of necrotic left great toe and hyperglycemia.  He has not taken insulin  since his hospital discharge over 10 days ago.  ED Course: He was given 1 L IV fluids by EMS prior to arrival.  Left lower extremity pulses not palpable but were audible on Doppler.  Review of Systems:  ROS  Past Medical History:  Diagnosis Date  . Adenomatous colon polyp   . Allergic rhinitis   . At risk for sleep apnea    STOP-BANG= 5   SENT TO PCP 03-14-2014  . Bladder cancer (HCC)   . CAD (coronary artery disease)    a. 07/2014 low risk MV; b. 07/2019 Cor CTA (FFR): LM 25-49 (nl), LAD mild prox/mid plaque (nl), D1 25-49p(nl w/ abnl FFR of 0.73 in inf branch), LCX/OM1 mild prox/mid plaque (nl), RCA nondominant, minimal Ca2+ plaque (nl), RPDA (mildly abnl @ 0.79)-->Med Rx..  . Cataract    surgically removed bilateral  . Condyloma acuminatum of penis   . Diabetic neuropathy (HCC)   . Diastolic dysfunction    a. 05/2019 Echo: EF 60-65%, no rwam, mod  LVH, impaired relaxation, nl RV size/fxn, trace MR, triv TR.  . Diverticulosis   . GERD (gastroesophageal reflux disease)   . History of bladder cancer    s/p  turbt  2013/   transitional cell carcinoma--   . History of condyloma acuminatum    PERINEAL AREA  W/ RECURRENCY  . History of gout   . Hyperlipidemia   . Hypertension   . Lower urinary tract symptoms (LUTS)   . PAF (paroxysmal atrial fibrillation) (HCC)    a. 06/2019 Event monitor: PAF <1% burden. Longest 3 mins 36 secs.  . Productive cough   . PSVT (paroxysmal supraventricular tachycardia) (HCC)    a. 06/2019 Event monitor: 112 episodes of SVT, longest 21 secs.  SABRA PVD (peripheral vascular disease) with claudication (HCC)    a. 03/2014 LE art duplex: long segment occlusion of mid to distal R SFA; b. 03/2020 ABI: nl left and mildly improved R ABI->med rx.  . Renal artery stenosis (HCC)   . Renal artery stenosis (HCC)    a. 12/2020 Renal art duplex: RRA 1-59%, LRA >60%.  . Smokers' cough (HCC)   . Type 2 diabetes mellitus with insulin  therapy (HCC) 1992   monitor by  dr ellsion  . Wears dentures    upper    Past Surgical History:  Procedure Laterality Date  . ABDOMINAL AORTOGRAM W/LOWER EXTREMITY N/A 02/15/2024   Procedure: ABDOMINAL AORTOGRAM W/LOWER EXTREMITY;  Surgeon: Pearline Norman RAMAN, MD;  Location: [facility] INVASIVE CV [facility];  Service: Cardiovascular;  Laterality: N/A;  . AMPUTATION Left 01/02/2024   Procedure: AMPUTATION, FOOT, RAY;  Surgeon: Malvin Marsa FALCON, DPM;  Location: [facility] OR;  Service: Orthopedics/Podiatry;  Laterality: Left;  FIFTH TOE  . AXILLARY HIDRADENITIS EXCISION  1997  . CARDIOVASCULAR STRESS TEST  07-24-2014  dr darron grass   Low risk lexiscan  nuclear study with apical thinning and small inferolateral wall infarct at mid & basal level , no ischemia/  normal LVF and wall motion , ef 59%  . CATARACT EXTRACTION Left   . CATARACT EXTRACTION W/ INTRAOCULAR LENS IMPLANT Right   . CO2 LASER APPLICATION N/A  03/20/2014   Procedure: CO2 LASER APPLICATION,PENIS, GROIN, ANUS;  Surgeon: Elspeth KYM Schultze, MD;  Location: [facility] [place];  Service: General;  Laterality: N/A;  . CO2 LASER APPLICATION N/A 05/21/2015   Procedure: CO2 LASER APPLICATION;  Surgeon: Mark Ottelin, MD;  Location: [facility];  Service: Urology;  Laterality: N/A;  . CONDYLOMA EXCISION/FULGURATION N/A 05/21/2015   Procedure: CONDYLOMA REMOVAL;  Surgeon: Mark Ottelin, MD;  Location: [facility];  Service: Urology;  Laterality: N/A;  . HEMORRHOID SURGERY  10/24/2014   Procedure: HEMORRHOIDECTOMY;  Surgeon: Elspeth Schultze, MD;  Location: [facility];  Service: General;;  . INCISION AND DRAINAGE ABSCESS Left 10/24/2014   Procedure: INCISION AND DRAINAGE ABSCESS;  Surgeon: Elspeth Schultze, MD;  Location: [facility] [place];  Service: General;  Laterality: Left;  . INGUINAL HIDRADENITIS EXCISION  1998, 1999  . LASER ABLATION CONDOLAMATA N/A 03/20/2014   Procedure: EXAM UNDER ANESTHESIA, REMOVAL/ABLATION OF CONDYLOMATA PENIS,GROINS, ANUS, ANAL CANAL;  Surgeon: Elspeth KYM Schultze, MD;  Location: [facility] [place];  Service: General;  Laterality: N/A;  groin and anus  . LASER ABLATION CONDOLAMATA N/A 10/24/2014   Procedure: LASER ABLATION CONDOLAMATA;  Surgeon: Elspeth Schultze, MD;  Location: [facility];  Service: General;  Laterality: N/A;  . LASER ABLATION OF PENILE AND PERIANAL WARTS  07-29-2007  Dr. Schultze  . LEFT SHOULDER SURGERY  2003  . LOWER EXTREMITY ANGIOGRAPHY N/A 01/01/2024   Procedure: Lower Extremity Angiography;  Surgeon: Sheree Penne Bruckner, MD;  Location: [facility] INVASIVE CV LAB;  Service: Vascular;  Laterality: N/A;  . LOWER EXTREMITY ANGIOGRAPHY N/A 02/15/2024   Procedure: Lower Extremity Angiography;  Surgeon: Pearline Norman RAMAN, MD;  Location: [facility] INVASIVE CV LAB;  Service: Cardiovascular;  Laterality: N/A;  . LOWER EXTREMITY INTERVENTION  01/01/2024    Procedure: LOWER EXTREMITY INTERVENTION;  Surgeon: Sheree Penne Bruckner, MD;  Location: [facility] INVASIVE CV LAB;  Service: Vascular;;  . LOWER EXTREMITY INTERVENTION N/A 02/15/2024   Procedure: LOWER EXTREMITY INTERVENTION;  Surgeon: Pearline Norman RAMAN, MD;  Location: [facility] INVASIVE CV LAB;  Service: Cardiovascular;  Laterality: N/A;  . MASS EXCISION N/A 10/24/2014   Procedure: EXCISION OF PERINEAL MASS/SINUS;  Surgeon: Elspeth Schultze, MD;  Location: [facility] [place];  Service: General;  Laterality: N/A;  . MOHS SURGERY     back  . MOHS SURGERY  2017   face  . MULTIPLE TOOTH EXTRACTIONS    . PERINEAL HIDRADENITIS EXCISION  1998, 1999  . TRANSURETHRAL RESECTION OF BLADDER TUMOR  05/21/2012   Procedure: TRANSURETHRAL RESECTION OF BLADDER TUMOR (TURBT);  Surgeon: Mark C Ottelin, MD;  Location: [facility];  Service: Urology;  Laterality: N/A;        reports that he has been smoking cigars and cigarettes. He started smoking about 49 years ago.  He has a 41 pack-year smoking history. He has never used smokeless tobacco. He reports that he does not drink alcohol and does not use drugs.  Allergies  Allergen Reactions  . Cefepime  Hives    Had allergic reaction to one of these 3 agents, unclear which one  . Metoprolol  Hives    Had allergic reaction to one of these 3 agents, unclear which one  *Per RN, highly likely this is the cause of allergic rxn  . Myrbetriq  [Mirabegron ] Hives    Had allergic reaction to one of these 3 agents, unclear which one    Family History  Problem Relation Age of Onset  . Hypertension Mother   . Lung cancer Father   . Diabetes Maternal Aunt        x 2  . Colon cancer Neg Hx   . Esophageal cancer Neg Hx   . Pancreatic cancer Neg Hx   . Prostate cancer Neg Hx   . Kidney disease Neg Hx   . Liver disease Neg Hx   . Rectal cancer Neg Hx   . Stomach cancer Neg Hx     Prior to Admission medications   Medication Sig Start Date End Date Taking?  Authorizing Provider  aspirin  EC 81 MG tablet Take 1 tablet (81 mg total) by mouth daily. Swallow whole. 02/17/24   Baglia, Corrina, PA-C  atorvastatin  (LIPITOR ) 40 MG tablet TAKE 1 TABLET BY MOUTH ONCE DAILY 02/22/24   Norleen Lynwood ORN, MD  Blood Glucose Monitoring Suppl (ONE TOUCH ULTRA 2) w/Device KIT Use as directed to check blood sugars twice a day 11/25/19   Norleen Lynwood ORN, MD  citalopram  (CELEXA ) 20 MG tablet Take 1 tablet (20 mg total) by mouth daily. 02/22/24   Norleen Lynwood ORN, MD  clindamycin  (CLEOCIN ) 300 MG capsule Take 1 capsule (300 mg total) by mouth 3 (three) times daily. 02/22/24   Norleen Lynwood ORN, MD  clopidogrel  (PLAVIX ) 75 MG tablet Take 1 tablet (75 mg total) by mouth daily. 02/22/24 02/21/25  Norleen Lynwood ORN, MD  insulin  glargine, 2 Unit Dial , (TOUJEO  MAX SOLOSTAR) 300 UNIT/ML Solostar Pen Inject 100 Units into the skin every morning. Patient not taking: Reported on 02/25/2024 02/22/24   Norleen Lynwood ORN, MD  irbesartan  (AVAPRO ) 300 MG tablet Take 1 tablet (300 mg total) by mouth daily. 02/22/24   Norleen Lynwood ORN, MD  Lancets [facility] DELICA PLUS [place]) MISC CHECK BLOOD SUGAR TWICE DAILY 09/10/23   Norleen Lynwood ORN, MD  loratadine  (CLARITIN ) 10 MG tablet Take 10 mg by mouth daily as needed.    [provider]  [facility] ULTRA test strip CHECK BLOOD SUGAR TWICE DAILY 11/25/22   Norleen Lynwood ORN, MD  oxyCODONE  (OXY IR/ROXICODONE ) 5 MG immediate release tablet Take 1 tablet (5 mg total) by mouth every 4 (four) hours as needed for severe pain (pain score 7-10). 02/16/24   Baglia, Corrina, PA-C  pantoprazole  (PROTONIX ) 40 MG tablet Take 1 tablet (40 mg total) by mouth daily. 02/22/24   Norleen Lynwood ORN, MD    Physical Exam: Vitals:   02/27/24 1714 02/27/24 2014 02/27/24 2219 02/27/24 2330  BP: (!) 145/78 (!) 141/58  (!) 129/54  Pulse: 75 73  68  Resp: 17 18  16   Temp: 98 F (36.7 C)  98.2 F (36.8 C)   TempSrc:   Oral   SpO2: 100% 96%  100%    Physical Exam  Labs on Admission: I have personally  reviewed following labs and imaging  studies  CBC: Recent Labs  Lab 02/22/24 1454 02/27/24 1726 02/27/24 1732  WBC 12.7* 17.5*  --   NEUTROABS 9.6*  --   --   HGB 14.9 14.7 14.3  HCT 43.0 41.1 42.0  MCV 96.0 93.0  --   PLT 357.0 272  --    Basic Metabolic Panel: Recent Labs  Lab 02/22/24 1454 02/27/24 1726 02/27/24 1732  NA 130* 127* 128*  K 4.0 3.9 3.9  CL 90* 91*  --   CO2 33* 24  --   GLUCOSE 484* 558*  --   BUN 18 10  --   CREATININE 0.91 0.98  --   CALCIUM  9.3 8.8*  --    GFR: Estimated Creatinine Clearance: 70.8 mL/min (by C-G formula based on SCr of 0.98 mg/dL). Liver Function Tests: Recent Labs  Lab 02/22/24 1454 02/27/24 1726  AST 11 20  ALT 11 13  ALKPHOS 91 98  BILITOT 0.7 1.1  PROT 6.5 6.9  ALBUMIN 3.4* 2.7*   No results for input(s): LIPASE, AMYLASE in the last 168 hours. No results for input(s): AMMONIA in the last 168 hours. Coagulation Profile: No results for input(s): INR, PROTIME in the last 168 hours. Cardiac Enzymes: No results for input(s): CKTOTAL, CKMB, CKMBINDEX, TROPONINI in the last 168 hours. BNP (last 3 results) No results for input(s): PROBNP in the last 8760 hours. HbA1C: No results for input(s): HGBA1C in the last 72 hours. CBG: Recent Labs  Lab 02/27/24 1708 02/27/24 1843 02/27/24 2237  GLUCAP 580* 502* 169*   Lipid Profile: No results for input(s): CHOL, HDL, LDLCALC, TRIG, CHOLHDL, LDLDIRECT in the last 72 hours. Thyroid  Function Tests: No results for input(s): TSH, T4TOTAL, FREET4, T3FREE, THYROIDAB in the last 72 hours. Anemia Panel: No results for input(s): VITAMINB12, FOLATE, FERRITIN, TIBC, IRON, RETICCTPCT in the last 72 hours. Urine analysis:    Component Value Date/Time   COLORURINE STRAW (A) 02/27/2024 1716   APPEARANCEUR CLEAR 02/27/2024 1716   LABSPEC 1.027 02/27/2024 1716   PHURINE 5.0 02/27/2024 1716   GLUCOSEU >=500 (A) 02/27/2024 1716    GLUCOSEU >=1000 (A) 02/22/2024 1454   HGBUR NEGATIVE 02/27/2024 1716   BILIRUBINUR NEGATIVE 02/27/2024 1716   KETONESUR NEGATIVE 02/27/2024 1716   PROTEINUR NEGATIVE 02/27/2024 1716   UROBILINOGEN 0.2 02/22/2024 1454   NITRITE NEGATIVE 02/27/2024 1716   LEUKOCYTESUR TRACE (A) 02/27/2024 1716    Radiological Exams on Admission: DG Foot Complete Left Result Date: 02/27/2024 EXAM: 3 or more VIEW(S) XRAY OF THE LEFT FOOT 02/27/2024 07:23:23 PM COMPARISON: Outside facility radiographs dated 02/22/2024. CLINICAL HISTORY: Great toe necrosis. FINDINGS: BONES AND JOINTS: Status post fifth digit amputation at the MTP joint. Mild lucency and cortical destruction involving the first distal tuft with associated soft tissue gas, suggesting osteomyelitis. SOFT TISSUES: Soft tissue gas noted in the region of the first distal tuft. IMPRESSION: 1. Suspected osteomyelitis involving the 1st distal tuft. 2. Status post fifth digit amputation at the MTP joint. Electronically signed by: Pinkie Pebbles MD 02/27/2024 07:40 PM EDT RP Workstation: [id number]    EKG: Independently reviewed. ***  Assessment and Plan  DVT prophylaxis: {Blank single:19197::Lovenox ,SQ Heparin ,IV heparin  gtt,Xarelto,Eliquis,Coumadin,SCDs,***} Code Status: {Blank single:19197::Full Code,DNR,DNR/DNI,Comfort Care,***} Family Communication: ***  Consults called: ***  Level of care: {Blank single:19197::Med-Surg,Telemetry bed,Progressive Care Unit,Step Down Unit} Admission status: *** Time Spent: 75+ minutes***  Editha Ram MD [facility]  If 7PM-7AM, please contact night-coverage [email or web address]  02/27/2024, 11:45 PM

## 2024-02-27 NOTE — ED Notes (Signed)
 Date and time results received: 02/27/24 1902  (use smartphrase .now to insert current time)  Test: Glucose Critical Value: 558  Name of Provider Notified: PA Warren

## 2024-02-27 NOTE — ED Triage Notes (Signed)
 Pt arrives via [place] ems with reports of hyperglycemia and black left big toe. Pt received 1L NacL en route.

## 2024-02-27 NOTE — ED Provider Notes (Signed)
 [place] EMERGENCY DEPARTMENT AT [facility] Provider Note   CSN: [id number] Arrival date & time: 02/27/24  1653     Patient presents with: Hyperglycemia   [name] is a 68 y.o. male.  With past medical history of peripheral artery disease, uncontrolled type 2 diabetes, hypertension, hyperlipidemia, status post left fifth digit amputation in May, status post femoral stent placement in July of this year presents to ER with complaint of hyperglycemia.  Patient reports the hospital sent him home without his insulin  and so he has not taken insulin  for the last 10 days.  Sounds like he has not been taking any of his medication at home.  Started noticing about 7 days of left lower extremity swelling, erythema and pain.  He noticed that his left great toe had started to turn black.  Reports chills at home and nausea. No chest pain, shortness of breath, abdominal pain.     Hyperglycemia      Prior to Admission medications   Medication Sig Start Date End Date Taking? Authorizing Provider  aspirin  EC 81 MG tablet Take 1 tablet (81 mg total) by mouth daily. Swallow whole. 02/17/24   Baglia, Corrina, PA-C  atorvastatin  (LIPITOR ) 40 MG tablet TAKE 1 TABLET BY MOUTH ONCE DAILY 02/22/24   Norleen Lynwood LELON, MD  Blood Glucose Monitoring Suppl (ONE TOUCH ULTRA 2) w/Device KIT Use as directed to check blood sugars twice a day 11/25/19   Norleen Lynwood LELON, MD  citalopram  (CELEXA ) 20 MG tablet Take 1 tablet (20 mg total) by mouth daily. 02/22/24   Norleen Lynwood LELON, MD  clindamycin  (CLEOCIN ) 300 MG capsule Take 1 capsule (300 mg total) by mouth 3 (three) times daily. 02/22/24   Norleen Lynwood LELON, MD  clopidogrel  (PLAVIX ) 75 MG tablet Take 1 tablet (75 mg total) by mouth daily. 02/22/24 02/21/25  Norleen Lynwood LELON, MD  insulin  glargine, 2 Unit Dial , (TOUJEO  MAX SOLOSTAR) 300 UNIT/ML Solostar Pen Inject 100 Units into the skin every morning. Patient not taking: Reported on 02/25/2024 02/22/24   Norleen Lynwood LELON, MD   irbesartan  (AVAPRO ) 300 MG tablet Take 1 tablet (300 mg total) by mouth daily. 02/22/24   Norleen Lynwood LELON, MD  Lancets [facility] DELICA PLUS [place]) MISC CHECK BLOOD SUGAR TWICE DAILY 09/10/23   Norleen Lynwood LELON, MD  loratadine  (CLARITIN ) 10 MG tablet Take 10 mg by mouth daily as needed.    [provider]  [facility] ULTRA test strip CHECK BLOOD SUGAR TWICE DAILY 11/25/22   Norleen Lynwood LELON, MD  oxyCODONE  (OXY IR/ROXICODONE ) 5 MG immediate release tablet Take 1 tablet (5 mg total) by mouth every 4 (four) hours as needed for severe pain (pain score 7-10). 02/16/24   Baglia, Corrina, PA-C  pantoprazole  (PROTONIX ) 40 MG tablet Take 1 tablet (40 mg total) by mouth daily. 02/22/24   Norleen Lynwood LELON, MD    Allergies: Cefepime , Metoprolol , and Myrbetriq  [mirabegron ]    Review of Systems  Skin:  Positive for color change.    Updated Vital Signs BP (!) 145/78 (BP Location: Right Arm)   Pulse 75   Temp 98 F (36.7 C)   Resp 17   SpO2 100%   Physical Exam Vitals and nursing note reviewed.  Constitutional:      General: He is not in acute distress.    Appearance: He is not toxic-appearing.  HENT:     Head: Normocephalic and atraumatic.  Eyes:     General: No scleral icterus.  Conjunctiva/sclera: Conjunctivae normal.  Cardiovascular:     Rate and Rhythm: Normal rate and regular rhythm.     Pulses: Normal pulses.     Heart sounds: Normal heart sounds.  Pulmonary:     Effort: Pulmonary effort is normal. No respiratory distress.     Breath sounds: Normal breath sounds.  Abdominal:     General: Abdomen is flat. Bowel sounds are normal.     Palpations: Abdomen is soft.     Tenderness: There is no abdominal tenderness.  Skin:    General: Skin is warm and dry.     Findings: No lesion.  Neurological:     General: No focal deficit present.     Mental Status: He is alert and oriented to person, place, and time. Mental status is at baseline.           (all labs ordered are listed,  but only abnormal results are displayed) Labs Reviewed  CBC - Abnormal; Notable for the following components:      Result Value   WBC 17.5 (*)    All other components within normal limits  URINALYSIS, ROUTINE W REFLEX MICROSCOPIC - Abnormal; Notable for the following components:   Color, Urine STRAW (*)    Glucose, UA >=500 (*)    Leukocytes,Ua TRACE (*)    All other components within normal limits  COMPREHENSIVE METABOLIC PANEL WITH GFR - Abnormal; Notable for the following components:   Sodium 127 (*)    Chloride 91 (*)    Glucose, Bld 558 (*)    Calcium  8.8 (*)    Albumin 2.7 (*)    All other components within normal limits  CBG MONITORING, ED - Abnormal; Notable for the following components:   Glucose-Capillary 580 (*)    All other components within normal limits  CBG MONITORING, ED - Abnormal; Notable for the following components:   Glucose-Capillary 502 (*)    All other components within normal limits  I-STAT VENOUS BLOOD GAS, ED - Abnormal; Notable for the following components:   pO2, Ven 22 (*)    Bicarbonate 29.7 (*)    Acid-Base Excess 4.0 (*)    Sodium 128 (*)    All other components within normal limits  CULTURE, BLOOD (ROUTINE X 2)  CULTURE, BLOOD (ROUTINE X 2)  BETA-HYDROXYBUTYRIC ACID    EKG: None  Radiology: DG Foot Complete Left Result Date: 02/27/2024 EXAM: 3 or more VIEW(S) XRAY OF THE LEFT FOOT 02/27/2024 07:23:23 PM COMPARISON: Outside facility radiographs dated 02/22/2024. CLINICAL HISTORY: Great toe necrosis. FINDINGS: BONES AND JOINTS: Status post fifth digit amputation at the MTP joint. Mild lucency and cortical destruction involving the first distal tuft with associated soft tissue gas, suggesting osteomyelitis. SOFT TISSUES: Soft tissue gas noted in the region of the first distal tuft. IMPRESSION: 1. Suspected osteomyelitis involving the 1st distal tuft. 2. Status post fifth digit amputation at the MTP joint. Electronically signed by: Pinkie Pebbles MD 02/27/2024 07:40 PM EDT RP Workstation: [id number]     Procedures   Medications Ordered in the ED  oxyCODONE  (Oxy IR/ROXICODONE ) immediate release tablet 5 mg (has no administration in time range)                                    Medical Decision Making Amount and/or Complexity of Data Reviewed Labs: ordered. Radiology: ordered.  Risk Prescription drug management. Decision regarding hospitalization.   This patient presents  to the ED for concern of toe wound, this involves an extensive number of treatment options, and is a complaint that carries with it a high risk of complications and morbidity.  The differential diagnosis includes peripheral artery disease, diabetic wound, necrosis   Co morbidities that complicate the patient evaluation  peripheral artery disease, uncontrolled type 2 diabetes, hypertension, hyperlipidemia, status post left fifth digit amputation in May, status post femoral stent placement in July   Additional history obtained:  Additional history obtained from reviewed recent discharge 02/16/2024   Lab Tests:  I personally interpreted labs.  The pertinent results include:   Patient has leukocytosis of 17.5 Patient has elevated blood sugar at 550 but no anion gap Lactic is normal.  Blood cultures pending   Imaging Studies ordered:  I ordered imaging studies including left foot x-ray I independently visualized and interpreted imaging which showed osteomyelitis of left great toe I agree with the radiologist interpretation   Cardiac Monitoring: / EKG:  The patient was maintained on a cardiac monitor.    Consultations Obtained:  Spoke with podiatry Dr. Janit who will see patient in consult Spoke with vascular Dr. Silver who will see patient in consult Hospitalist has agreed to admission   Problem List / ED Course / Critical interventions / Medication management  Patient presents with complaint of hyperglycemia and left great  toe necrosis.  He has cap refill and Dopplers confirm pulse present.  He has signs of cellulitis and swelling as well.  He did have recent admission for the fifth digit amputation as well as recent stent with vascular.  He does also have hyperglycemia but no anion gap and he is not acidotic.  Have lower suspicion for DKA but obtaining beta hydroxy butyrate acid as well, he is hemodynamically stable here.  He is given fluids and antibiotics.  I have consulted with vascular and podiatry.  Hospitalist is excepted for admission. I ordered medication including 1 L normal saline, Vanco, Zosyn  and 10 units of insulin  for hyperglycemia Reevaluation of the patient after these medicines showed that the patient improved I have reviewed the patients home medicines and have made adjustments as needed      Final diagnoses:  Toe necrosis (HCC)  Cellulitis of left lower extremity  Hyperglycemia    ED Discharge Orders     None          [name] 02/27/24 2253    Randol Simmonds, MD 02/28/24 [phone]

## 2024-02-27 NOTE — H&P (Signed)
 History and Physical    [name] FMW:[id number] DOB: Oct 15, 1955 DOA: 02/27/2024  PCP: Norleen Lynwood LELON, MD  Patient coming from: Home  Chief Complaint: Necrotic left great toe  HPI: [name] is a 68 y.o. male with medical history significant of CAD, PAF not on anticoagulation, PSVT, PAD, renal artery stenosis, insulin -dependent type 2 diabetes, GERD, gout, hypertension, hyperlipidemia, bladder cancer status post TURBT in 2013, tobacco abuse.  He was admitted 5/20-5/24/2025 for left fifth toe cellulitis/osteomyelitis and underwent toe amputation.  Noted to have abnormal ABIs during this hospitalization and underwent angiogram with left SFA stenting on 5/23.  Subsequently seen by vascular surgery on 7/2 for worsening discoloration of his left foot and changes to his left great toe.  His noninvasive studies demonstrated a severely depressed ABI with an occluded left SFA stent on duplex.  He underwent repeat stenting of left SFA on 7/7.  Patient returns to the ED today for evaluation of necrotic left great toe, swelling and redness of his left foot/lower leg.  He is reporting pain in this foot and requesting medications.  Reports taking antibiotics at home without much improvement.  Denies fevers or chills.  Patient reports being sent home from the hospital without insulin  and has not taken insulin  for over 10 days.  He reports smoking cigarettes/cigars all my life and states nicotine patch does not work for him.  Denies shortness of breath, chest pain, nausea, vomiting, or abdominal pain.  ED Course: He was given 1 L IV fluids by EMS prior to arrival.  Vital signs on arrival: Temperature 98 F, pulse 75, respiratory rate 17, blood pressure 145/78, and SpO2 100% on room air.  Labs notable for WBC count 17.5, sodium 127, chloride 91, glucose 558, bicarb 24, anion gap 12, calcium  8.8, albumin 2.7, VBG with pH 7.41, blood cultures in process.  UA with >500 glucose, trace leukocytes, and microscopy  showing 6-10 WBCs and no bacteria.  Left foot pulses not palpable but were audible on Doppler.  X-ray of left foot showing suspected osteomyelitis of the first distal tuft.  Patient was given NovoLog  10 units, oxycodone , vancomycin , Zosyn , and 1 L IV fluids.  Glucose improved to 169 after insulin  and IV fluids.  ED PA discussed the case with vascular surgery (Dr. Silver) who will consult and requested podiatry consultation for toe amputation.  Case was also discussed with podiatry (Dr. Janit) who will consult in the morning.  Review of Systems:  Review of Systems  All other systems reviewed and are negative.   Past Medical History:  Diagnosis Date   Adenomatous colon polyp    Allergic rhinitis    At risk for sleep apnea    STOP-BANG= 5   SENT TO PCP 03-14-2014   Bladder cancer (HCC)    CAD (coronary artery disease)    a. 07/2014 low risk MV; b. 07/2019 Cor CTA (FFR): LM 25-49 (nl), LAD mild prox/mid plaque (nl), D1 25-49p(nl w/ abnl FFR of 0.73 in inf branch), LCX/OM1 mild prox/mid plaque (nl), RCA nondominant, minimal Ca2+ plaque (nl), RPDA (mildly abnl @ 0.79)-->Med Rx..   Cataract    surgically removed bilateral   Condyloma acuminatum of penis    Diabetic neuropathy (HCC)    Diastolic dysfunction    a. 05/2019 Echo: EF 60-65%, no rwam, mod LVH, impaired relaxation, nl RV size/fxn, trace MR, triv TR.   Diverticulosis    GERD (gastroesophageal reflux disease)    History of bladder cancer    s/p  turbt  2013/   transitional cell carcinoma--    History of condyloma acuminatum    PERINEAL AREA  W/ RECURRENCY   History of gout    Hyperlipidemia    Hypertension    Lower urinary tract symptoms (LUTS)    PAF (paroxysmal atrial fibrillation) (HCC)    a. 06/2019 Event monitor: PAF <1% burden. Longest 3 mins 36 secs.   Productive cough    PSVT (paroxysmal supraventricular tachycardia) (HCC)    a. 06/2019 Event monitor: 112 episodes of SVT, longest 21 secs.   PVD (peripheral vascular  disease) with claudication (HCC)    a. 03/2014 LE art duplex: long segment occlusion of mid to distal R SFA; b. 03/2020 ABI: nl left and mildly improved R ABI->med rx.   Renal artery stenosis (HCC)    Renal artery stenosis (HCC)    a. 12/2020 Renal art duplex: RRA 1-59%, LRA >60%.   Smokers' cough (HCC)    Type 2 diabetes mellitus with insulin  therapy [facility]) 1992   monitor by  dr ellsion   Wears dentures    upper    Past Surgical History:  Procedure Laterality Date   ABDOMINAL AORTOGRAM W/LOWER EXTREMITY N/A 02/15/2024   Procedure: ABDOMINAL AORTOGRAM W/LOWER EXTREMITY;  Surgeon: Pearline Norman RAMAN, MD;  Location: [facility] INVASIVE CV LAB;  Service: Cardiovascular;  Laterality: N/A;   AMPUTATION Left 01/02/2024   Procedure: AMPUTATION, FOOT, RAY;  Surgeon: Malvin Marsa FALCON, DPM;  Location: [facility] OR;  Service: Orthopedics/Podiatry;  Laterality: Left;  FIFTH TOE   AXILLARY HIDRADENITIS EXCISION  1997   CARDIOVASCULAR STRESS TEST  07-24-2014  dr darron grass   Low risk lexiscan  nuclear study with apical thinning and small inferolateral wall infarct at mid & basal level , no ischemia/  normal LVF and wall motion , ef 59%   CATARACT EXTRACTION Left    CATARACT EXTRACTION W/ INTRAOCULAR LENS IMPLANT Right    CO2 LASER APPLICATION N/A 03/20/2014   Procedure: CO2 LASER APPLICATION,PENIS, GROIN, ANUS;  Surgeon: Elspeth KYM Schultze, MD;  Location: [facility];  Service: General;  Laterality: N/A;   CO2 LASER APPLICATION N/A 05/21/2015   Procedure: CO2 LASER APPLICATION;  Surgeon: Mark Ottelin, MD;  Location: [facility];  Service: Urology;  Laterality: N/A;   CONDYLOMA EXCISION/FULGURATION N/A 05/21/2015   Procedure: CONDYLOMA REMOVAL;  Surgeon: Mark Ottelin, MD;  Location: [facility];  Service: Urology;  Laterality: N/A;   HEMORRHOID SURGERY  10/24/2014   Procedure: HEMORRHOIDECTOMY;  Surgeon: Elspeth Schultze, MD;  Location[facility];  Service:  General;;   INCISION AND DRAINAGE ABSCESS Left 10/24/2014   Procedure: INCISION AND DRAINAGE ABSCESS;  Surgeon: Elspeth Schultze, MD;  Location: [facility];  Service: General;  Laterality: Left;   INGUINAL HIDRADENITIS EXCISION  1998, 1999   LASER ABLATION CONDOLAMATA N/A 03/20/2014   Procedure: EXAM UNDER ANESTHESIA, REMOVAL/ABLATION OF CONDYLOMATA PENIS,GROINS, ANUS, ANAL CANAL;  Surgeon: Elspeth KYM Schultze, MD;  Location: [facility];  Service: General;  Laterality: N/A;  groin and anus   LASER ABLATION CONDOLAMATA N/A 10/24/2014   Procedure: LASER ABLATION CONDOLAMATA;  Surgeon: Elspeth Schultze, MD;  Location: [facility];  Service: General;  Laterality: N/A;   LASER ABLATION OF PENILE AND PERIANAL WARTS  07-29-2007  Dr. Schultze   LEFT SHOULDER SURGERY  2003   LOWER EXTREMITY ANGIOGRAPHY N/A 01/01/2024   Procedure: Lower Extremity Angiography;  Surgeon: Sheree Penne Bruckner, MD;  Location: [facility] INVASIVE CV  LAB;  Service: Vascular;  Laterality: N/A;   LOWER EXTREMITY ANGIOGRAPHY N/A 02/15/2024   Procedure: Lower Extremity Angiography;  Surgeon: Pearline Norman RAMAN, MD;  Location: [facility] INVASIVE CV LAB;  Service: Cardiovascular;  Laterality: N/A;   LOWER EXTREMITY INTERVENTION  01/01/2024   Procedure: LOWER EXTREMITY INTERVENTION;  Surgeon: Sheree Penne Bruckner, MD;  Location: [facility] INVASIVE CV LAB;  Service: Vascular;;   LOWER EXTREMITY INTERVENTION N/A 02/15/2024   Procedure: LOWER EXTREMITY INTERVENTION;  Surgeon: Pearline Norman RAMAN, MD;  Location: [facility] INVASIVE CV LAB;  Service: Cardiovascular;  Laterality: N/A;   MASS EXCISION N/A 10/24/2014   Procedure: EXCISION OF PERINEAL MASS/SINUS;  Surgeon: Elspeth Schultze, MD;  Location: [facility];  Service: General;  Laterality: N/A;   MOHS SURGERY     back   MOHS SURGERY  2017   face   MULTIPLE TOOTH EXTRACTIONS     PERINEAL HIDRADENITIS EXCISION  1998, 1999   TRANSURETHRAL RESECTION OF BLADDER TUMOR   05/21/2012   Procedure: TRANSURETHRAL RESECTION OF BLADDER TUMOR (TURBT);  Surgeon: Mark C Ottelin, MD;  Location: [facility];  Service: Urology;  Laterality: N/A;        reports that he has been smoking cigars and cigarettes. He started smoking about 49 years ago. He has a 41 pack-year smoking history. He has never used smokeless tobacco. He reports that he does not drink alcohol and does not use drugs.  Allergies  Allergen Reactions   Cefepime  Hives    Had allergic reaction to one of these 3 agents, unclear which one   Metoprolol  Hives    Had allergic reaction to one of these 3 agents, unclear which one  *Per RN, highly likely this is the cause of allergic rxn   Myrbetriq  [Mirabegron ] Hives    Had allergic reaction to one of these 3 agents, unclear which one    Family History  Problem Relation Age of Onset   Hypertension Mother    Lung cancer Father    Diabetes Maternal Aunt        x 2   Colon cancer Neg Hx    Esophageal cancer Neg Hx    Pancreatic cancer Neg Hx    Prostate cancer Neg Hx    Kidney disease Neg Hx    Liver disease Neg Hx    Rectal cancer Neg Hx    Stomach cancer Neg Hx     Prior to Admission medications   Medication Sig Start Date End Date Taking? Authorizing Provider  aspirin  EC 81 MG tablet Take 1 tablet (81 mg total) by mouth daily. Swallow whole. 02/17/24   Baglia, Corrina, PA-C  atorvastatin  (LIPITOR ) 40 MG tablet TAKE 1 TABLET BY MOUTH ONCE DAILY 02/22/24   Norleen Lynwood ORN, MD  Blood Glucose Monitoring Suppl (ONE TOUCH ULTRA 2) w/Device KIT Use as directed to check blood sugars twice a day 11/25/19   Norleen Lynwood ORN, MD  citalopram  (CELEXA ) 20 MG tablet Take 1 tablet (20 mg total) by mouth daily. 02/22/24   Norleen Lynwood ORN, MD  clindamycin  (CLEOCIN ) 300 MG capsule Take 1 capsule (300 mg total) by mouth 3 (three) times daily. 02/22/24   Norleen Lynwood ORN, MD  clopidogrel  (PLAVIX ) 75 MG tablet Take 1 tablet (75 mg total) by mouth daily. 02/22/24 02/21/25   Norleen Lynwood ORN, MD  insulin  glargine, 2 Unit Dial , (TOUJEO  MAX SOLOSTAR) 300 UNIT/ML Solostar Pen Inject 100 Units into the skin every morning. Patient not taking: Reported on 02/25/2024 02/22/24  Norleen Lynwood ORN, MD  irbesartan  (AVAPRO ) 300 MG tablet Take 1 tablet (300 mg total) by mouth daily. 02/22/24   Norleen Lynwood ORN, MD  Lancets [facility] DELICA PLUS [place]) MISC CHECK BLOOD SUGAR TWICE DAILY 09/10/23   Norleen Lynwood ORN, MD  loratadine  (CLARITIN ) 10 MG tablet Take 10 mg by mouth daily as needed.    [provider]  [facility] ULTRA test strip CHECK BLOOD SUGAR TWICE DAILY 11/25/22   Norleen Lynwood ORN, MD  oxyCODONE  (OXY IR/ROXICODONE ) 5 MG immediate release tablet Take 1 tablet (5 mg total) by mouth every 4 (four) hours as needed for severe pain (pain score 7-10). 02/16/24   Baglia, Corrina, PA-C  pantoprazole  (PROTONIX ) 40 MG tablet Take 1 tablet (40 mg total) by mouth daily. 02/22/24   Norleen Lynwood ORN, MD    Physical Exam: Vitals:   02/27/24 1714 02/27/24 2014 02/27/24 2219 02/27/24 2330  BP: (!) 145/78 (!) 141/58  (!) 129/54  Pulse: 75 73  68  Resp: 17 18  16   Temp: 98 F (36.7 C)  98.2 F (36.8 C)   TempSrc:   Oral   SpO2: 100% 96%  100%    Physical Exam Vitals reviewed.  Constitutional:      General: He is not in acute distress. Eyes:     Extraocular Movements: Extraocular movements intact.  Cardiovascular:     Rate and Rhythm: Normal rate and regular rhythm.     Pulses: Normal pulses.  Pulmonary:     Effort: Pulmonary effort is normal. No respiratory distress.     Breath sounds: Normal breath sounds. No wheezing or rales.  Abdominal:     General: Bowel sounds are normal. There is no distension.     Palpations: Abdomen is soft.     Tenderness: There is no abdominal tenderness. There is no guarding.  Musculoskeletal:     Cervical back: Normal range of motion.     Left lower leg: Edema present.     Comments: Left great toe necrotic.  Swelling, erythema, and warmth of the left  foot and lower leg.  Skin:    General: Skin is warm and dry.  Neurological:     General: No focal deficit present.     Mental Status: He is alert and oriented to person, place, and time.           Labs on Admission: I have personally reviewed following labs and imaging studies  CBC: Recent Labs  Lab 02/22/24 1454 02/27/24 1726 02/27/24 1732  WBC 12.7* 17.5*  --   NEUTROABS 9.6*  --   --   HGB 14.9 14.7 14.3  HCT 43.0 41.1 42.0  MCV 96.0 93.0  --   PLT 357.0 272  --    Basic Metabolic Panel: Recent Labs  Lab 02/22/24 1454 02/27/24 1726 02/27/24 1732  NA 130* 127* 128*  K 4.0 3.9 3.9  CL 90* 91*  --   CO2 33* 24  --   GLUCOSE 484* 558*  --   BUN 18 10  --   CREATININE 0.91 0.98  --   CALCIUM  9.3 8.8*  --    GFR: Estimated Creatinine Clearance: 70.8 mL/min (by C-G formula based on SCr of 0.98 mg/dL). Liver Function Tests: Recent Labs  Lab 02/22/24 1454 02/27/24 1726  AST 11 20  ALT 11 13  ALKPHOS 91 98  BILITOT 0.7 1.1  PROT 6.5 6.9  ALBUMIN 3.4* 2.7*   No results for input(s): LIPASE, AMYLASE in the  last 168 hours. No results for input(s): AMMONIA in the last 168 hours. Coagulation Profile: No results for input(s): INR, PROTIME in the last 168 hours. Cardiac Enzymes: No results for input(s): CKTOTAL, CKMB, CKMBINDEX, TROPONINI in the last 168 hours. BNP (last 3 results) No results for input(s): PROBNP in the last 8760 hours. HbA1C: No results for input(s): HGBA1C in the last 72 hours. CBG: Recent Labs  Lab 02/27/24 1708 02/27/24 1843 02/27/24 2237  GLUCAP 580* 502* 169*   Lipid Profile: No results for input(s): CHOL, HDL, LDLCALC, TRIG, CHOLHDL, LDLDIRECT in the last 72 hours. Thyroid  Function Tests: No results for input(s): TSH, T4TOTAL, FREET4, T3FREE, THYROIDAB in the last 72 hours. Anemia Panel: No results for input(s): VITAMINB12, FOLATE, FERRITIN, TIBC, IRON, RETICCTPCT in the  last 72 hours. Urine analysis:    Component Value Date/Time   COLORURINE STRAW (A) 02/27/2024 1716   APPEARANCEUR CLEAR 02/27/2024 1716   LABSPEC 1.027 02/27/2024 1716   PHURINE 5.0 02/27/2024 1716   GLUCOSEU >=500 (A) 02/27/2024 1716   GLUCOSEU >=1000 (A) 02/22/2024 1454   HGBUR NEGATIVE 02/27/2024 1716   BILIRUBINUR NEGATIVE 02/27/2024 1716   KETONESUR NEGATIVE 02/27/2024 1716   PROTEINUR NEGATIVE 02/27/2024 1716   UROBILINOGEN 0.2 02/22/2024 1454   NITRITE NEGATIVE 02/27/2024 1716   LEUKOCYTESUR TRACE (A) 02/27/2024 1716    Radiological Exams on Admission: DG Foot Complete Left Result Date: 02/27/2024 EXAM: 3 or more VIEW(S) XRAY OF THE LEFT FOOT 02/27/2024 07:23:23 PM COMPARISON: Outside facility radiographs dated 02/22/2024. CLINICAL HISTORY: Great toe necrosis. FINDINGS: BONES AND JOINTS: Status post fifth digit amputation at the MTP joint. Mild lucency and cortical destruction involving the first distal tuft with associated soft tissue gas, suggesting osteomyelitis. SOFT TISSUES: Soft tissue gas noted in the region of the first distal tuft. IMPRESSION: 1. Suspected osteomyelitis involving the 1st distal tuft. 2. Status post fifth digit amputation at the MTP joint. Electronically signed by: Pinkie Pebbles MD 02/27/2024 07:40 PM EDT RP Workstation: [id number]    EKG: Independently reviewed.  Sinus rhythm, no acute ischemic changes.  Assessment and Plan  Necrotic left great toe with osteomyelitis Left lower extremity cellulitis PAD status post recent left SFA stenting on 02/15/2024 Has leukocytosis on labs but vital signs stable.  No signs of sepsis at this time.  Left foot pulses not palpable but were audible on Doppler.  X-ray showing suspected osteomyelitis of the first distal tuft.  Continue vancomycin  and Zosyn .  Trend WBC count and follow-up blood cultures.  Patient will need amputation of this toe.  Vascular surgery and podiatry consulted.  Keep n.p.o. after midnight.   Holding aspirin  and Plavix  overnight until patient is evaluated by podiatry and they decide on the timing of surgery.  Continue Lipitor .  Continue pain management.  Patient has been counseled to quit smoking.  Poorly controlled insulin -dependent type 2 diabetes Due to medication noncompliance.  Glucose in the 500s without signs of DKA.  Patient was given NovoLog  10 units and 1 L IV fluids in the ED after which glucose improved to 169.  Recent A1c 11.2 on 02/22/2024.  Continue home long-acting insulin .  Placed on sensitive sliding scale insulin  every 4 hours as patient will be n.p.o. after midnight.  Pseudohyponatremia In the setting of significant hyperglycemia on initial labs.  Continue to monitor sodium level.  CAD Not endorsing anginal symptoms.  EKG without acute ischemic changes.  PAF Not on anticoagulation.  GERD Continue Protonix .  Hypertension Currently normotensive.  Continue irbesartan .  Hyperlipidemia Continue Lipitor .  Mood disorder Continue Celexa .  DVT prophylaxis: SCDs Code Status: Full Code (discussed with the patient) Family Communication: No family available at this time. Consults called: Vascular surgery, podiatry Level of care: Telemetry bed Admission status: It is my clinical opinion that admission to INPATIENT is reasonable and necessary because of the expectation that this patient will require hospital care that crosses at least 2 midnights to treat this condition based on the medical complexity of the problems presented.  Given the aforementioned information, the predictability of an adverse outcome is felt to be significant.   Editha Ram MD [facility]  If 7PM-7AM, please contact night-coverage [email or web address]  02/27/2024, 11:45 PM

## 2024-02-27 NOTE — Progress Notes (Signed)
 ED Pharmacy Antibiotic Sign Off An antibiotic consult was received from an ED provider for vancomycin  and zosyn  per pharmacy dosing for cellulitis. A chart review was completed to assess appropriateness.  The following one time order(s) were placed per pharmacy consult:  zosyn  3.375g x 1 dose vancomycin  1500 mg x 1 dose  Further antibiotic and/or antibiotic pharmacy consults should be ordered by the admitting provider if indicated.   Thank you for allowing pharmacy to be a part of this patient's care.   Dorn Buttner, PharmD, BCPS 02/27/2024 7:11 PM ED Clinical Pharmacist -  [phone]

## 2024-02-27 NOTE — ED Provider Triage Note (Signed)
 Emergency Medicine Provider Triage Evaluation Note  [name] , a 68 y.o. male  was evaluated in triage.  Pt complains of right lower extremity pain. Is worsening despite Abx.  S/p left SFA stenting on 5/23 with Dr. Sheree. Reports he is out of his insulin  as well. No chest pain or SOB.  Review of Systems  Positive:  Negative:   Physical Exam  BP (!) 145/78 (BP Location: Right Arm)   Pulse 75   Temp 98 F (36.7 C)   Resp 17   SpO2 100%  Gen:   Awake, no distress   Resp:  Normal effort  MSK:   Left lower extremity with 2+ pitting edema, generalized tenderness, erythematous, no significant increased warmth, necrotic great toe noted with, blistering wound on heal. DP/PT pulses detectable on doppler Other:    Medical Decision Making  Medically screening exam initiated at 5:29 PM.  Appropriate orders placed.  [name] was informed that the remainder of the evaluation will be completed by another provider, this initial triage assessment does not replace that evaluation, and the importance of remaining in the ED until their evaluation is complete.  Noted to be hyperglycemic to 580, will obtain lower extremity doppler and infectious labs   [name] 02/27/24 1738

## 2024-02-28 ENCOUNTER — Encounter (HOSPITAL_COMMUNITY): Payer: Self-pay | Admitting: Internal Medicine

## 2024-02-28 DIAGNOSIS — E11628 Type 2 diabetes mellitus with other skin complications: Secondary | ICD-10-CM | POA: Diagnosis not present

## 2024-02-28 DIAGNOSIS — Z95828 Presence of other vascular implants and grafts: Secondary | ICD-10-CM

## 2024-02-28 DIAGNOSIS — Z7982 Long term (current) use of aspirin: Secondary | ICD-10-CM

## 2024-02-28 DIAGNOSIS — I739 Peripheral vascular disease, unspecified: Secondary | ICD-10-CM

## 2024-02-28 DIAGNOSIS — F17210 Nicotine dependence, cigarettes, uncomplicated: Secondary | ICD-10-CM

## 2024-02-28 DIAGNOSIS — E11621 Type 2 diabetes mellitus with foot ulcer: Secondary | ICD-10-CM

## 2024-02-28 DIAGNOSIS — I10 Essential (primary) hypertension: Secondary | ICD-10-CM | POA: Diagnosis not present

## 2024-02-28 DIAGNOSIS — R627 Adult failure to thrive: Secondary | ICD-10-CM

## 2024-02-28 DIAGNOSIS — Z91148 Patient's other noncompliance with medication regimen for other reason: Secondary | ICD-10-CM

## 2024-02-28 DIAGNOSIS — Z9889 Other specified postprocedural states: Secondary | ICD-10-CM

## 2024-02-28 DIAGNOSIS — S81802A Unspecified open wound, left lower leg, initial encounter: Secondary | ICD-10-CM

## 2024-02-28 DIAGNOSIS — L03119 Cellulitis of unspecified part of limb: Secondary | ICD-10-CM | POA: Diagnosis not present

## 2024-02-28 DIAGNOSIS — L97529 Non-pressure chronic ulcer of other part of left foot with unspecified severity: Secondary | ICD-10-CM

## 2024-02-28 DIAGNOSIS — L97422 Non-pressure chronic ulcer of left heel and midfoot with fat layer exposed: Secondary | ICD-10-CM | POA: Diagnosis not present

## 2024-02-28 DIAGNOSIS — Z794 Long term (current) use of insulin: Secondary | ICD-10-CM

## 2024-02-28 DIAGNOSIS — Z79899 Other long term (current) drug therapy: Secondary | ICD-10-CM

## 2024-02-28 LAB — BASIC METABOLIC PANEL WITH GFR
Anion gap: 9 (ref 5–15)
BUN: 9 mg/dL (ref 8–23)
CO2: 26 mmol/L (ref 22–32)
Calcium: 8.9 mg/dL (ref 8.9–10.3)
Chloride: 98 mmol/L (ref 98–111)
Creatinine, Ser: 0.83 mg/dL (ref 0.61–1.24)
GFR, Estimated: >60 mL/min (ref >60)
Glucose, Bld: 76 mg/dL (ref 70–99)
Potassium: 3.6 mmol/L (ref 3.5–5.1)
Sodium: 133 mmol/L — ABNORMAL LOW (ref 135–145)

## 2024-02-28 LAB — CBC
HCT: 38.6 % — ABNORMAL LOW (ref 39.0–52.0)
Hemoglobin: 13.9 g/dL (ref 13.0–17.0)
MCH: 33.2 pg (ref 26.0–34.0)
MCHC: 36 g/dL (ref 30.0–36.0)
MCV: 92.1 fL (ref 80.0–100.0)
Platelets: 270 K/uL (ref 150–400)
RBC: 4.19 MIL/uL — ABNORMAL LOW (ref 4.22–5.81)
RDW: 12.3 % (ref 11.5–15.5)
WBC: 17.2 K/uL — ABNORMAL HIGH (ref 4.0–10.5)
nRBC: 0 % (ref 0.0–0.2)

## 2024-02-28 LAB — GLUCOSE, CAPILLARY
Glucose-Capillary: 136 mg/dL — ABNORMAL HIGH (ref 70–99)
Glucose-Capillary: 336 mg/dL — ABNORMAL HIGH (ref 70–99)
Glucose-Capillary: 382 mg/dL — ABNORMAL HIGH (ref 70–99)

## 2024-02-28 LAB — SURGICAL PCR SCREEN
MRSA, PCR: NEGATIVE
Staphylococcus aureus: NEGATIVE

## 2024-02-28 LAB — CBG MONITORING, ED
Glucose-Capillary: 306 mg/dL — ABNORMAL HIGH (ref 70–99)
Glucose-Capillary: 91 mg/dL (ref 70–99)
Glucose-Capillary: 138 mg/dL — ABNORMAL HIGH (ref 70–99)

## 2024-02-28 MED ORDER — IRBESARTAN 300 MG PO TABS
300.0000 mg | ORAL_TABLET | Freq: Every day | ORAL | Status: DC
  Administered 2024-02-28 – 2024-03-04 (×6): 300 mg via ORAL
  Filled 2024-02-28 (×6): qty 1

## 2024-02-28 MED ORDER — PANTOPRAZOLE SODIUM 40 MG PO TBEC
40.0000 mg | DELAYED_RELEASE_TABLET | Freq: Every day | ORAL | Status: DC
  Administered 2024-02-28 – 2024-03-04 (×6): 40 mg via ORAL
  Filled 2024-02-28 (×6): qty 1

## 2024-02-28 MED ORDER — INSULIN GLARGINE-YFGN 100 UNIT/ML ~~LOC~~ SOLN
100.0000 [IU] | Freq: Every day | SUBCUTANEOUS | Status: DC
  Filled 2024-02-28 (×2): qty 1

## 2024-02-28 MED ORDER — MORPHINE SULFATE (PF) 2 MG/ML IV SOLN
1.0000 mg | INTRAVENOUS | Status: DC | PRN
  Administered 2024-02-28 – 2024-03-03 (×2): 1 mg via INTRAVENOUS
  Filled 2024-02-28 (×2): qty 1

## 2024-02-28 MED ORDER — CITALOPRAM HYDROBROMIDE 20 MG PO TABS
20.0000 mg | ORAL_TABLET | Freq: Every day | ORAL | Status: DC
  Administered 2024-02-28 – 2024-03-04 (×6): 20 mg via ORAL
  Filled 2024-02-28 (×2): qty 1
  Filled 2024-02-28: qty 2
  Filled 2024-02-28 (×3): qty 1

## 2024-02-28 MED ORDER — OXYCODONE HCL 5 MG PO TABS
5.0000 mg | ORAL_TABLET | Freq: Four times a day (QID) | ORAL | Status: DC | PRN
  Administered 2024-02-28 – 2024-03-04 (×14): 5 mg via ORAL
  Filled 2024-02-28 (×14): qty 1

## 2024-02-28 MED ORDER — INSULIN GLARGINE (2 UNIT DIAL) 300 UNIT/ML ~~LOC~~ SOPN: 100.0000 [IU] | PEN_INJECTOR | SUBCUTANEOUS | Status: DC

## 2024-02-28 MED ORDER — MUPIROCIN 2 % EX OINT
1.0000 | TOPICAL_OINTMENT | Freq: Two times a day (BID) | CUTANEOUS | Status: AC
  Administered 2024-02-28 – 2024-03-03 (×9): 1 via NASAL
  Filled 2024-02-28 (×2): qty 22

## 2024-02-28 MED ORDER — ACETAMINOPHEN 325 MG PO TABS
650.0000 mg | ORAL_TABLET | Freq: Four times a day (QID) | ORAL | Status: DC | PRN
  Administered 2024-03-01 – 2024-03-04 (×3): 650 mg via ORAL
  Filled 2024-02-28 (×3): qty 2

## 2024-02-28 MED ORDER — INSULIN ASPART 100 UNIT/ML IJ SOLN
0.0000 [IU] | INTRAMUSCULAR | Status: DC
  Administered 2024-02-28: 9 [IU] via SUBCUTANEOUS
  Administered 2024-02-28: 7 [IU] via SUBCUTANEOUS
  Administered 2024-02-28 (×2): 1 [IU] via SUBCUTANEOUS
  Administered 2024-02-28: 7 [IU] via SUBCUTANEOUS
  Administered 2024-02-29 (×2): 2 [IU] via SUBCUTANEOUS
  Administered 2024-02-29: 12 [IU] via SUBCUTANEOUS
  Administered 2024-02-29 (×2): 7 [IU] via SUBCUTANEOUS
  Administered 2024-03-01: 5 [IU] via SUBCUTANEOUS
  Administered 2024-03-01: 2 [IU] via SUBCUTANEOUS
  Administered 2024-03-01: 3 [IU] via SUBCUTANEOUS
  Administered 2024-03-01: 7 [IU] via SUBCUTANEOUS
  Administered 2024-03-01 (×2): 9 [IU] via SUBCUTANEOUS
  Administered 2024-03-02: 5 [IU] via SUBCUTANEOUS
  Administered 2024-03-02 (×2): 7 [IU] via SUBCUTANEOUS
  Administered 2024-03-02: 9 [IU] via SUBCUTANEOUS
  Administered 2024-03-02: 2 [IU] via SUBCUTANEOUS
  Administered 2024-03-03: 3 [IU] via SUBCUTANEOUS
  Administered 2024-03-03: 1 [IU] via SUBCUTANEOUS
  Filled 2024-02-28: qty 1

## 2024-02-28 MED ORDER — ATORVASTATIN CALCIUM 40 MG PO TABS
40.0000 mg | ORAL_TABLET | Freq: Every day | ORAL | Status: DC
  Administered 2024-02-28 – 2024-03-04 (×6): 40 mg via ORAL
  Filled 2024-02-28 (×6): qty 1

## 2024-02-28 MED ORDER — PIPERACILLIN-TAZOBACTAM 3.375 G IVPB
3.3750 g | Freq: Three times a day (TID) | INTRAVENOUS | Status: DC
  Administered 2024-02-28 – 2024-03-03 (×15): 3.375 g via INTRAVENOUS
  Filled 2024-02-28 (×14): qty 50

## 2024-02-28 MED ORDER — NALOXONE HCL 0.4 MG/ML IJ SOLN: 0.4000 mg | INTRAMUSCULAR | Status: DC | PRN

## 2024-02-28 MED ORDER — VANCOMYCIN HCL 1500 MG/300ML IV SOLN
1500.0000 mg | INTRAVENOUS | Status: DC
  Administered 2024-02-28 – 2024-03-01 (×3): 1500 mg via INTRAVENOUS
  Filled 2024-02-28 (×3): qty 300

## 2024-02-28 MED ORDER — ACETAMINOPHEN 650 MG RE SUPP: 650.0000 mg | Freq: Four times a day (QID) | RECTAL | Status: DC | PRN

## 2024-02-28 NOTE — ED Notes (Signed)
Vascular MD at the bedside   

## 2024-02-28 NOTE — Progress Notes (Signed)
 PROGRESS NOTE    [name]  FMW:[id number] DOB: [birth year] DOA: 02/27/2024 PCP: Norleen Lynwood LELON, MD  Outpatient Specialists:     Brief Narrative:  Patient is a 68 year old male, noncompliant, past medical history significant for coronary artery disease, paroxysmal atrial fibrillation not on anticoagulation, PSVT, PAD, renal artery stenosis, type 2 diabetes mellitus on insulin  (been noncompliant), GERD, gout, hypertension, hyperlipidemia, bladder cancer status post TURP and tobacco abuse.  Patient was admitted recently with left fifth toe cellulitis/osteomyelitis leading to amputation of the left fifth toe.  Around 02/10/2024, worsening discoloration of the left foot was noted, with changes involving left great toe.  Patient was eventually found to have occluded left SFA, that was stented.  Patient underwent repeat stenting of the left SFA on 02/15/2024.  Patient is admitted with necrotic left great toe, with some cellulitic changes and swelling of the left lower leg/foot.  02/28/2024: Vascular surgery input is appreciated.  Vascular surgery team saw patient and recommended surgery for tomorrow, to be done alongside podiatry team.  Patient is on IV vancomycin  and Zosyn  for likely infection.  Purulent areas are noted at the heel area and other areas of the plantar surface.  Patient will likely benefit from I&D.    Assessment & Plan:   Principal Problem:   Osteomyelitis (HCC) Active Problems:   Essential hypertension   PAD (peripheral artery disease) (HCC)   Diabetes (HCC)   Cellulitis in diabetic foot (HCC)   Necrotic left great toe with osteomyelitis Left lower extremity cellulitis PAD status post recent left SFA stenting on 02/15/2024 Possible left foot abscess: -See above documentation. - Vascular surgery input is appreciated. -For likely surgery tomorrow. - Leukocytosis. - On IV vancomycin  and Zosyn . - Awaiting podiatry input. - Follow cultures.   Poorly controlled insulin -dependent  type 2 diabetes: -Patient is noncompliant.   - Glucose of greater than 500 on presentation.   - Blood sugars controlled.   - Query patient is financially challenged.   - Need to comply with medications discussed extensively with patient.   - Last A1c was 11.2.   Pseudohyponatremia/hyponatremia: -Sodium has improved from 127-133.   -Continue to monitor renal function and electrolytes.  CAD -Stable.     -EKG without acute ischemic changes.   PAF Not on anticoagulation.   GERD Continue Protonix .   Hypertension Currently normotensive.   Continue irbesartan .   Hyperlipidemia Continue Lipitor .   Mood disorder Continue Celexa .    DVT prophylaxis: SCD. Code Status: Full code Family Communication:  Disposition Plan: Inpatient.   Consultants:  Vascular surgery. Awaiting podiatry input.  Procedures:  For surgery tomorrow  Antimicrobials:  DC vancomycin . IV Zosyn .   Subjective: No new complaints. No fever or chills.  Objective: Vitals:   02/28/24 0600 02/28/24 0811 02/28/24 1100 02/28/24 1200  BP: 126/62  (!) 144/66 (!) 155/84  Pulse: (!) 55  60 64  Resp: 16  16 16   Temp:  97.8 F (36.6 C)  97.7 F (36.5 C)  TempSrc:    Oral  SpO2: 98%  99% 100%    Intake/Output Summary (Last 24 hours) at 02/28/2024 1208 Last data filed at 02/28/2024 0503 Gross per 24 hour  Intake 1350 ml  Output 350 ml  Net 1000 ml   There were no vitals filed for this visit.  Examination:  General exam: Appears calm and comfortable  Respiratory system: Clear to auscultation.  Cardiovascular system: S1 & S2 Gastrointestinal system: Abdomen is soft and nontender.   Central nervous system: Awake  and alert.   Extremities:                 Data Reviewed: I have personally reviewed following labs and imaging studies  CBC: Recent Labs  Lab 02/22/24 1454 02/27/24 1726 02/27/24 1732 02/28/24 0405  WBC 12.7* 17.5*  --  17.2*  NEUTROABS 9.6*  --   --   --   HGB 14.9  14.7 14.3 13.9  HCT 43.0 41.1 42.0 38.6*  MCV 96.0 93.0  --  92.1  PLT 357.0 272  --  270   Basic Metabolic Panel: Recent Labs  Lab 02/22/24 1454 02/27/24 1726 02/27/24 1732 02/28/24 0405  NA 130* 127* 128* 133*  K 4.0 3.9 3.9 3.6  CL 90* 91*  --  98  CO2 33* 24  --  26  GLUCOSE 484* 558*  --  76  BUN 18 10  --  9  CREATININE 0.91 0.98  --  0.83  CALCIUM  9.3 8.8*  --  8.9   GFR: Estimated Creatinine Clearance: 83.6 mL/min (by C-G formula based on SCr of 0.83 mg/dL). Liver Function Tests: Recent Labs  Lab 02/22/24 1454 02/27/24 1726  AST 11 20  ALT 11 13  ALKPHOS 91 98  BILITOT 0.7 1.1  PROT 6.5 6.9  ALBUMIN 3.4* 2.7*   No results for input(s): LIPASE, AMYLASE in the last 168 hours. No results for input(s): AMMONIA in the last 168 hours. Coagulation Profile: No results for input(s): INR, PROTIME in the last 168 hours. Cardiac Enzymes: No results for input(s): CKTOTAL, CKMB, CKMBINDEX, TROPONINI in the last 168 hours. BNP (last 3 results) No results for input(s): PROBNP in the last 8760 hours. HbA1C: No results for input(s): HGBA1C in the last 72 hours. CBG: Recent Labs  Lab 02/27/24 1843 02/27/24 2237 02/28/24 0111 02/28/24 0313 02/28/24 0758  GLUCAP 502* 169* 306* 91 138*   Lipid Profile: No results for input(s): CHOL, HDL, LDLCALC, TRIG, CHOLHDL, LDLDIRECT in the last 72 hours. Thyroid  Function Tests: No results for input(s): TSH, T4TOTAL, FREET4, T3FREE, THYROIDAB in the last 72 hours. Anemia Panel: No results for input(s): VITAMINB12, FOLATE, FERRITIN, TIBC, IRON, RETICCTPCT in the last 72 hours. Urine analysis:    Component Value Date/Time   COLORURINE STRAW (A) 02/27/2024 1716   APPEARANCEUR CLEAR 02/27/2024 1716   LABSPEC 1.027 02/27/2024 1716   PHURINE 5.0 02/27/2024 1716   GLUCOSEU >=500 (A) 02/27/2024 1716   GLUCOSEU >=1000 (A) 02/22/2024 1454   HGBUR NEGATIVE 02/27/2024 1716    BILIRUBINUR NEGATIVE 02/27/2024 1716   KETONESUR NEGATIVE 02/27/2024 1716   PROTEINUR NEGATIVE 02/27/2024 1716   UROBILINOGEN 0.2 02/22/2024 1454   NITRITE NEGATIVE 02/27/2024 1716   LEUKOCYTESUR TRACE (A) 02/27/2024 1716   Sepsis Labs: @LABRCNTIP (procalcitonin:4,lacticidven:4)  ) Recent Results (from the past 240 hours)  Blood culture (routine x 2)     Status: None (Preliminary result)   Collection Time: 02/27/24  7:33 PM   Specimen: BLOOD RIGHT ARM  Result Value Ref Range Status   Specimen Description BLOOD RIGHT ARM  Final   Special Requests   Final    BOTTLES DRAWN AEROBIC AND ANAEROBIC Blood Culture adequate volume   Culture   Final    NO GROWTH < 12 HOURS Performed at [facility], [address] [place], [place] [address]    Report Status PENDING  Incomplete  Blood culture (routine x 2)     Status: None (Preliminary result)   Collection Time: 02/27/24  7:33 PM  Specimen: BLOOD  Result Value Ref Range Status   Specimen Description BLOOD BLOOD LEFT ARM  Final   Special Requests   Final    BOTTLES DRAWN AEROBIC AND ANAEROBIC Blood Culture adequate volume   Culture   Final    NO GROWTH < 12 HOURS Performed at [facility], [address] [place], [place] [address]    Report Status PENDING  Incomplete         Radiology Studies: DG Foot Complete Left Result Date: 02/27/2024 EXAM: 3 or more VIEW(S) XRAY OF THE LEFT FOOT 02/27/2024 07:23:23 PM COMPARISON: Outside facility radiographs dated 02/22/2024. CLINICAL HISTORY: Great toe necrosis. FINDINGS: BONES AND JOINTS: Status post fifth digit amputation at the MTP joint. Mild lucency and cortical destruction involving the first distal tuft with associated soft tissue gas, suggesting osteomyelitis. SOFT TISSUES: Soft tissue gas noted in the region of the first distal tuft. IMPRESSION: 1. Suspected osteomyelitis involving the 1st distal tuft. 2. Status post fifth digit amputation at the MTP joint. Electronically  signed by: Sriyesh Krishnan MD 02/27/2024 07:40 PM EDT RP Workstation: [id number]        Scheduled Meds:  atorvastatin   40 mg Oral Daily   citalopram   20 mg Oral Daily   insulin  aspart  0-9 Units Subcutaneous Q4H   insulin  glargine-yfgn  100 Units Subcutaneous Daily   irbesartan   300 mg Oral Daily   pantoprazole   40 mg Oral Daily   Continuous Infusions:  piperacillin -tazobactam (ZOSYN )  IV 3.375 g (02/28/24 1022)   vancomycin        LOS: 1 day    Time spent: 55 minutes.    Leatrice Chapel, MD  [facility] Pager #: [phone] 7PM-7AM contact night coverage as above

## 2024-02-28 NOTE — Consult Note (Signed)
 PODIATRY CONSULTATION  NAME [name] MRN [id number] DOB 1956-06-18 DOA 02/27/2024   Reason for consult: Gangrene LT foot Chief Complaint  Patient presents with   Hyperglycemia    Consulting physician: Warren Shad PA-C [facility] Emergency Department  History of present illness: 68 y.o. male PMHx CAD, uncontrolled T2DM, last A1c 11.2 on 02/22/2024, tobacco abuse, PAD w/ recent angiogram LT SFA stenting 02/15/2024 seen at bedside this morning in the ED for evaluation of gangrenous changes to the left great toe as well as redness and swelling to the left foot and leg.  Podiatry and vascular consulted.  Past Medical History:  Diagnosis Date   Adenomatous colon polyp    Allergic rhinitis    At risk for sleep apnea    STOP-BANG= 5   SENT TO PCP 03-14-2014   Bladder cancer (HCC)    CAD (coronary artery disease)    a. 07/2014 low risk MV; b. 07/2019 Cor CTA (FFR): LM 25-49 (nl), LAD mild prox/mid plaque (nl), D1 25-49p(nl w/ abnl FFR of 0.73 in inf branch), LCX/OM1 mild prox/mid plaque (nl), RCA nondominant, minimal Ca2+ plaque (nl), RPDA (mildly abnl @ 0.79)-->Med Rx..   Cataract    surgically removed bilateral   Condyloma acuminatum of penis    Diabetic neuropathy (HCC)    Diastolic dysfunction    a. 05/2019 Echo: EF 60-65%, no rwam, mod LVH, impaired relaxation, nl RV size/fxn, trace MR, triv TR.   Diverticulosis    GERD (gastroesophageal reflux disease)    History of bladder cancer    s/p  turbt  2013/   transitional cell carcinoma--    History of condyloma acuminatum    PERINEAL AREA  W/ RECURRENCY   History of gout    Hyperlipidemia    Hypertension    Lower urinary tract symptoms (LUTS)    PAF (paroxysmal atrial fibrillation) (HCC)    a. 06/2019 Event monitor: PAF <1% burden. Longest 3 mins 36 secs.   Productive cough    PSVT (paroxysmal supraventricular tachycardia) (HCC)    a. 06/2019 Event monitor: 112 episodes of SVT, longest 21 secs.   PVD (peripheral vascular disease)  with claudication (HCC)    a. 03/2014 LE art duplex: long segment occlusion of mid to distal R SFA; b. 03/2020 ABI: nl left and mildly improved R ABI->med rx.   Renal artery stenosis (HCC)    Renal artery stenosis (HCC)    a. 12/2020 Renal art duplex: RRA 1-59%, LRA >60%.   Smokers' cough (HCC)    Type 2 diabetes mellitus with insulin  therapy (HCC) 1992   monitor by  dr ellsion   Wears dentures    upper       Latest Ref Rng & Units 02/28/2024    4:05 AM 02/27/2024    5:32 PM 02/27/2024    5:26 PM  CBC  WBC 4.0 - 10.5 K/uL 17.2   17.5   Hemoglobin 13.0 - 17.0 g/dL 86.0  85.6  85.2   Hematocrit 39.0 - 52.0 % 38.6  42.0  41.1   Platelets 150 - 400 K/uL 270   272        Latest Ref Rng & Units 02/28/2024    4:05 AM 02/27/2024    5:32 PM 02/27/2024    5:26 PM  BMP  Glucose 70 - 99 mg/dL 76   441   BUN 8 - 23 mg/dL 9   10   Creatinine 9.38 - 1.24 mg/dL 9.16   9.01   Sodium  135 - 145 mmol/L 133  128  127   Potassium 3.5 - 5.1 mmol/L 3.6  3.9  3.9   Chloride 98 - 111 mmol/L 98   91   CO2 22 - 32 mmol/L 26   24   Calcium  8.9 - 10.3 mg/dL 8.9   8.8     2/80/7974 LT great toe  02/27/2024 LT foot  02/27/2024   Physical Exam: General: The patient is alert and oriented x3 in no acute distress.   Dermatology: Ischemic gangrenous changes distal tip of the left great toe.  Please see above noted photo.  No drainage.  Appears very dry and stable, nonemergent. Ulcer also noted to the plantar medial aspect of the left heel.  It appears somewhat superficial.  There is surrounding erythema and edema around the area.  Vascular: Please see vascular consult note this AM, 02/28/2024, Dr. DOROTHA Cheryle Rim, VVS Erythema with edema noted to the foot ankle and distal portion of the leg.  Please see below noted photos  Neurological: Diminished via light touch bilateral lower extremities  Musculoskeletal Exam: S/p LT 5th toe amputation, impatient Dr. Malvin, w/ well adhered eschar overlying the incision  site.  DOS: 01/02/2024.  Appears stable with no obvious indication of dehiscence  DG Foot Complete Left 02/27/2024 FINDINGS: BONES AND JOINTS: Status post fifth digit amputation at the MTP joint. Mild lucency and cortical destruction involving the first distal tuft with associated soft tissue gas, suggesting osteomyelitis. SOFT TISSUES: Soft tissue gas noted in the region of the first distal tuft.   IMPRESSION: 1. Suspected osteomyelitis involving the 1st distal tuft. 2. Status post fifth digit amputation at the MTP joint.  ASSESSMENT/PLAN OF CARE 1.  Gangrene/osteomyelitis distal tip of the left great toe 2.  Ulcer plantar medial heel 3.  Cellulitis LT LLE  -Patient seen at bedside this AM in Emergency Dept -No intervention from a vascular standpoint -Surgery tentatively planned for tomorrow w/ Dr. Malvin.  Including partial toe amputation, debridement w/ possible incision and drainage LT heel.  Orders placed -NPO after midnight.  Anticipating surgery tomorrow, 02/29/2024. -Will follow   Thank you for the consult.  Please contact me directly via secure chat with any questions or concerns.     Thresa EMERSON Sar, DPM [facility]  Dr. Thresa EMERSON Sar, DPM    [address] [place], [place] [address]                Office [phone]  Fax [phone]

## 2024-02-28 NOTE — Consult Note (Signed)
 Office Note     CC: Left lower extremity wounds Requesting Provider:  No ref. provider found  HPI: [name] is a 68 y.o. (1956-06-17) male presenting at the request of .Norleen Lynwood LELON, MD with left lower extremity wounds and failure to thrive.  Vascular surgery was called after having undergone recent revascularization of the left lower extremity-left SFA/above-knee popliteal artery stenting. Patient also underwent toe amputation with podiatry.  On exam, [name] was resting comfortably.  Unfortunately, his power and water have been shut off.  The bank is only on his home since the death of his wife of the year.  He has been living in their 3 season room, taking very poor care of himself.  Denies, fevers and chills no swelling in the left lower extremity.  States the great toe is now black with significant swelling of the left leg below the level of the knee. He states he never received his medications on hospital discharge, and therefore has been noncompliant.   Past Medical History:  Diagnosis Date   Adenomatous colon polyp    Allergic rhinitis    At risk for sleep apnea    STOP-BANG= 5   SENT TO PCP 03-14-2014   Bladder cancer (HCC)    CAD (coronary artery disease)    a. 07/2014 low risk MV; b. 07/2019 Cor CTA (FFR): LM 25-49 (nl), LAD mild prox/mid plaque (nl), D1 25-49p(nl w/ abnl FFR of 0.73 in inf branch), LCX/OM1 mild prox/mid plaque (nl), RCA nondominant, minimal Ca2+ plaque (nl), RPDA (mildly abnl @ 0.79)-->Med Rx..   Cataract    surgically removed bilateral   Condyloma acuminatum of penis    Diabetic neuropathy (HCC)    Diastolic dysfunction    a. 05/2019 Echo: EF 60-65%, no rwam, mod LVH, impaired relaxation, nl RV size/fxn, trace MR, triv TR.   Diverticulosis    GERD (gastroesophageal reflux disease)    History of bladder cancer    s/p  turbt  2013/   transitional cell carcinoma--    History of condyloma acuminatum    PERINEAL AREA  W/ RECURRENCY   History of  gout    Hyperlipidemia    Hypertension    Lower urinary tract symptoms (LUTS)    PAF (paroxysmal atrial fibrillation) (HCC)    a. 06/2019 Event monitor: PAF <1% burden. Longest 3 mins 36 secs.   Productive cough    PSVT (paroxysmal supraventricular tachycardia) (HCC)    a. 06/2019 Event monitor: 112 episodes of SVT, longest 21 secs.   PVD (peripheral vascular disease) with claudication (HCC)    a. 03/2014 LE art duplex: long segment occlusion of mid to distal R SFA; b. 03/2020 ABI: nl left and mildly improved R ABI->med rx.   Renal artery stenosis (HCC)    Renal artery stenosis (HCC)    a. 12/2020 Renal art duplex: RRA 1-59%, LRA >60%.   Smokers' cough (HCC)    Type 2 diabetes mellitus with insulin  therapy [facility]) 1992   monitor by  dr ellsion   Wears dentures    upper    Past Surgical History:  Procedure Laterality Date   ABDOMINAL AORTOGRAM W/LOWER EXTREMITY N/A 02/15/2024   Procedure: ABDOMINAL AORTOGRAM W/LOWER EXTREMITY;  Surgeon: Pearline Norman RAMAN, MD;  Location: [facility] INVASIVE CV LAB;  Service: Cardiovascular;  Laterality: N/A;   AMPUTATION Left 01/02/2024   Procedure: AMPUTATION, FOOT, RAY;  Surgeon: Malvin Marsa FALCON, DPM;  Location: [facility] OR;  Service: Orthopedics/Podiatry;  Laterality: Left;  FIFTH TOE  AXILLARY HIDRADENITIS EXCISION  1997   CARDIOVASCULAR STRESS TEST  07-24-2014  dr darron grass   Low risk lexiscan  nuclear study with apical thinning and small inferolateral wall infarct at mid & basal level , no ischemia/  normal LVF and wall motion , ef 59%   CATARACT EXTRACTION Left    CATARACT EXTRACTION W/ INTRAOCULAR LENS IMPLANT Right    CO2 LASER APPLICATION N/A 03/20/2014   Procedure: CO2 LASER APPLICATION,PENIS, GROIN, ANUS;  Surgeon: Elspeth KYM Schultze, MD;  Location: [facility];  Service: General;  Laterality: N/A;   CO2 LASER APPLICATION N/A 05/21/2015   Procedure: CO2 LASER APPLICATION;  Surgeon: Mark Ottelin, MD;  Location: [facility] [place];   Service: Urology;  Laterality: N/A;   CONDYLOMA EXCISION/FULGURATION N/A 05/21/2015   Procedure: CONDYLOMA REMOVAL;  Surgeon: Mark Ottelin, MD;  Location: [facility];  Service: Urology;  Laterality: N/A;   HEMORRHOID SURGERY  10/24/2014   Procedure: HEMORRHOIDECTOMY;  Surgeon: Elspeth Schultze, MD;  Location: [facility];  Service: General;;   INCISION AND DRAINAGE ABSCESS Left 10/24/2014   Procedure: INCISION AND DRAINAGE ABSCESS;  Surgeon: Elspeth Schultze, MD;  Location: [facility];  Service: General;  Laterality: Left;   INGUINAL HIDRADENITIS EXCISION  1998, 1999   LASER ABLATION CONDOLAMATA N/A 03/20/2014   Procedure: EXAM UNDER ANESTHESIA, REMOVAL/ABLATION OF CONDYLOMATA PENIS,GROINS, ANUS, ANAL CANAL;  Surgeon: Elspeth KYM Schultze, MD;  Location: [facility];  Service: General;  Laterality: N/A;  groin and anus   LASER ABLATION CONDOLAMATA N/A 10/24/2014   Procedure: LASER ABLATION CONDOLAMATA;  Surgeon: Elspeth Schultze, MD;  Location: [facility];  Service: General;  Laterality: N/A;   LASER ABLATION OF PENILE AND PERIANAL WARTS  07-29-2007  Dr. Schultze   LEFT SHOULDER SURGERY  2003   LOWER EXTREMITY ANGIOGRAPHY N/A 01/01/2024   Procedure: Lower Extremity Angiography;  Surgeon: Sheree Penne Bruckner, MD;  Location: [facility] INVASIVE CV LAB;  Service: Vascular;  Laterality: N/A;   LOWER EXTREMITY ANGIOGRAPHY N/A 02/15/2024   Procedure: Lower Extremity Angiography;  Surgeon: Pearline Norman RAMAN, MD;  Location: [facility] INVASIVE CV LAB;  Service: Cardiovascular;  Laterality: N/A;   LOWER EXTREMITY INTERVENTION  01/01/2024   Procedure: LOWER EXTREMITY INTERVENTION;  Surgeon: Sheree Penne Bruckner, MD;  Location: [facility] INVASIVE CV LAB;  Service: Vascular;;   LOWER EXTREMITY INTERVENTION N/A 02/15/2024   Procedure: LOWER EXTREMITY INTERVENTION;  Surgeon: Pearline Norman RAMAN, MD;  Location: [facility]VASIVE CV LAB;  Service: Cardiovascular;  Laterality: N/A;    MASS EXCISION N/A 10/24/2014   Procedure: EXCISION OF PERINEAL MASS/SINUS;  Surgeon: Elspeth Schultze, MD;  Location: [facility] ;  Service: General;  Laterality: N/A;   MOHS SURGERY     back   MOHS SURGERY  2017   face   MULTIPLE TOOTH EXTRACTIONS     PERINEAL HIDRADENITIS EXCISION  1998, 1999   TRANSURETHRAL RESECTION OF BLADDER TUMOR  05/21/2012   Procedure: TRANSURETHRAL RESECTION OF BLADDER TUMOR (TURBT);  Surgeon: Mark C Ottelin, MD;  Location: [facility];  Service: Urology;  Laterality: N/A;       Social History   Socioeconomic History   Marital status: Widowed    Spouse name: Not on file   Number of children: 0   Years of education: Not on file   Highest education level: Not on file  Occupational History   Occupation: Disabled/retired  Tobacco Use   Smoking status: Every Day  Current packs/day: 0.00    Average packs/day: 1 pack/day for 41.0 years (41.0 ttl pk-yrs)    Types: Cigars, Cigarettes    Start date: 72    Last attempt to quit: 2017    Years since quitting: 8.5   Smokeless tobacco: Never   Tobacco comments:    Quit cigarettes in 2017 but started back smoking Cigars  Vaping Use   Vaping status: Former  Substance and Sexual Activity   Alcohol use: No    Alcohol/week: 0.0 standard drinks of alcohol   Drug use: No   Sexual activity: Not Currently  Other Topics Concern   Not on file  Social History Narrative   Not on file   Social Drivers of Health   Financial Resource Strain: Medium Risk (02/17/2024)   Overall Financial Resource Strain (CARDIA)    Difficulty of Paying Living Expenses: Somewhat hard  Food Insecurity: Food Insecurity Present (02/17/2024)   Hunger Vital Sign    Worried About Running Out of Food in the Last Year: Often true    Ran Out of Food in the Last Year: Sometimes true  Transportation Needs: Unmet Transportation Needs (02/17/2024)   PRAPARE - Administrator, Civil Service (Medical): Yes    Lack  of Transportation (Non-Medical): Yes  Physical Activity: Inactive (06/10/2023)   Exercise Vital Sign    Days of Exercise per Week: 0 days    Minutes of Exercise per Session: 0 min  Stress: Stress Concern Present (02/17/2024)   [employer] of Occupational Health - Occupational Stress Questionnaire    Feeling of Stress: Rather much  Social Connections: Socially Isolated (02/16/2024)   Social Connection and Isolation Panel    Frequency of Communication with Friends and Family: Never    Frequency of Social Gatherings with Friends and Family: Never    Attends Religious Services: Never    Database administrator or Organizations: No    Attends Banker Meetings: Never    Marital Status: Widowed  Intimate Partner Violence: Patient Declined (02/16/2024)   Humiliation, Afraid, Rape, and Kick questionnaire    Fear of Current or Ex-Partner: Patient declined    Emotionally Abused: Patient declined    Physically Abused: Patient declined    Sexually Abused: Patient declined   Family History  Problem Relation Age of Onset   Hypertension Mother    Lung cancer Father    Diabetes Maternal Aunt        x 2   Colon cancer Neg Hx    Esophageal cancer Neg Hx    Pancreatic cancer Neg Hx    Prostate cancer Neg Hx    Kidney disease Neg Hx    Liver disease Neg Hx    Rectal cancer Neg Hx    Stomach cancer Neg Hx     Current Facility-Administered Medications  Medication Dose Route Frequency Provider Last Rate Last Admin   acetaminophen  (TYLENOL ) tablet 650 mg  650 mg Oral Q6H PRN Alfornia Madison, MD       Or   acetaminophen  (TYLENOL ) suppository 650 mg  650 mg Rectal Q6H PRN Alfornia Madison, MD       atorvastatin  (LIPITOR ) tablet 40 mg  40 mg Oral Daily Rathore, Vasundhra, MD       citalopram  (CELEXA ) tablet 20 mg  20 mg Oral Daily Rathore, Vasundhra, MD       insulin  aspart (novoLOG ) injection 0-9 Units  0-9 Units Subcutaneous Q4H Alfornia Madison, MD   7 Units at 02/28/24 0121  insulin  glargine-yfgn (SEMGLEE ) injection 100 Units  100 Units Subcutaneous Daily Rathore, Vasundhra, MD       irbesartan  (AVAPRO ) tablet 300 mg  300 mg Oral Daily Rathore, Vasundhra, MD       morphine  (PF) 2 MG/ML injection 1 mg  1 mg Intravenous Q4H PRN Rathore, Vasundhra, MD   1 mg at 02/28/24 0422   naloxone  (NARCAN ) injection 0.4 mg  0.4 mg Intravenous PRN Rathore, Vasundhra, MD       oxyCODONE  (Oxy IR/ROXICODONE ) immediate release tablet 5 mg  5 mg Oral Q6H PRN Rathore, Vasundhra, MD   5 mg at 02/28/24 0636   pantoprazole  (PROTONIX ) EC tablet 40 mg  40 mg Oral Daily Rathore, Vasundhra, MD       piperacillin -tazobactam (ZOSYN ) IVPB 3.375 g  3.375 g Intravenous Q8H Amend, Caron G, RPH   Stopped at 02/28/24 0502   vancomycin  (VANCOREADY) IVPB 1500 mg/300 mL  1,500 mg Intravenous Q24H Amend, Caron G, [facility]       Current Outpatient Medications  Medication Sig Dispense Refill   aspirin  EC 81 MG tablet Take 1 tablet (81 mg total) by mouth daily. Swallow whole. 30 tablet 12   atorvastatin  (LIPITOR ) 40 MG tablet TAKE 1 TABLET BY MOUTH ONCE DAILY 90 tablet 3   citalopram  (CELEXA ) 20 MG tablet Take 1 tablet (20 mg total) by mouth daily. 90 tablet 2   clindamycin  (CLEOCIN ) 300 MG capsule Take 1 capsule (300 mg total) by mouth 3 (three) times daily. 30 capsule 0   clopidogrel  (PLAVIX ) 75 MG tablet Take 1 tablet (75 mg total) by mouth daily. 30 tablet 11   insulin  glargine, 2 Unit Dial , (TOUJEO  MAX SOLOSTAR) 300 UNIT/ML Solostar Pen Inject 100 Units into the skin every morning. 15 mL 3   irbesartan  (AVAPRO ) 300 MG tablet Take 1 tablet (300 mg total) by mouth daily. 90 tablet 3   loratadine  (CLARITIN ) 10 MG tablet Take 10 mg by mouth daily as needed.     pantoprazole  (PROTONIX ) 40 MG tablet Take 1 tablet (40 mg total) by mouth daily. 100 tablet 2   Blood Glucose Monitoring Suppl (ONE TOUCH ULTRA 2) w/Device KIT Use as directed to check blood sugars twice a day 1 kit 0   Lancets (ONETOUCH DELICA PLUS  LANCET30G) MISC CHECK BLOOD SUGAR TWICE DAILY 200 each 2   ONETOUCH ULTRA test strip CHECK BLOOD SUGAR TWICE DAILY 200 strip 3   oxyCODONE  (OXY IR/ROXICODONE ) 5 MG immediate release tablet Take 1 tablet (5 mg total) by mouth every 4 (four) hours as needed for severe pain (pain score 7-10). (Patient not taking: Reported on 02/27/2024) 10 tablet 0    Allergies  Allergen Reactions   Cefepime  Hives    Had allergic reaction to one of these 3 agents, unclear which one   Metoprolol  Hives    Had allergic reaction to one of these 3 agents, unclear which one  *Per RN, highly likely this is the cause of allergic rxn   Myrbetriq  [Mirabegron ] Hives    Had allergic reaction to one of these 3 agents, unclear which one     REVIEW OF SYSTEMS:  [X]  denotes positive finding, [ ]  denotes negative finding Cardiac  Comments:  Chest pain or chest pressure:    Shortness of breath upon exertion:    Short of breath when lying flat:    Irregular heart rhythm:        Vascular    Pain in calf, thigh, or hip brought on by ambulation:  Pain in feet at night that wakes you up from your sleep:     Blood clot in your veins:    Leg swelling:         Pulmonary    Oxygen at home:    Productive cough:     Wheezing:         Neurologic    Sudden weakness in arms or legs:     Sudden numbness in arms or legs:     Sudden onset of difficulty speaking or slurred speech:    Temporary loss of vision in one eye:     Problems with dizziness:         Gastrointestinal    Blood in stool:     Vomited blood:         Genitourinary    Burning when urinating:     Blood in urine:        Psychiatric    Major depression:         Hematologic    Bleeding problems:    Problems with blood clotting too easily:        Skin    Rashes or ulcers:        Constitutional    Fever or chills:      PHYSICAL EXAMINATION:  Vitals:   02/28/24 0400 02/28/24 0417 02/28/24 0500 02/28/24 0600  BP: (!) 140/71  124/66 126/62   Pulse: 68  69 (!) 55  Resp: 20  19 16   Temp:  98.6 F (37 C)    TempSrc:  Oral    SpO2: 95%  95% 98%    General:  WDWN in NAD; vital signs documented above Gait: Not observed HENT: WNL, normocephalic Pulmonary: normal non-labored breathing , without wheezing Cardiac: regular HR Abdomen: soft, NT, no masses Skin: without rashes Edema in the left lower extremity Vascular Exam/Pulses:  Right Left  Radial 2+ (normal) 2+ (normal)  Ulnar    Femoral    Popliteal    DP Multiphasic signal Multiphasic signal  PT Phasic signal Multiphasic signal   Extremities: with ischemic changes specifically at the first toe.  The fifth toe amputation appears to be healing with an eschar in place, with Gangrene , with cellulitis; with open wounds;  Plantar ulcer at the heel. Musculoskeletal: no muscle wasting or atrophy  Neurologic: A&O X 3;  No focal weakness or paresthesias are detected Psychiatric:  The pt has Normal affect.   Non-Invasive Vascular Imaging:   Patient pending ABI, lower extremity duplex of the left lower   ASSESSMENT/PLAN: [name] is a 68 y.o. male presenting with failure to thrive due to poor living condition, medication noncompliance with worsening left lower extremity wounds, new plantar diabetic ulceration.  Physical exam, he has excellent signals in the left lower extremity.  I do not think he needs revascularization at this time as he recently had balloon angioplasty and stenting 7/7.  Will follow-up ABI and lower extremity duplex.  Recommend reaching out to podiatry, Dr. Malvin tomorrow for management of the lower extremity.    Fonda FORBES Rim, MD [facility] [phone]

## 2024-02-28 NOTE — ED Notes (Signed)
 Messaged attending to request pain medication for pt's reported 10/10 pain to left foot and leg.

## 2024-02-28 NOTE — Progress Notes (Signed)
 Pharmacy Antibiotic Note  [name] is a 68 y.o. male admitted on 02/27/2024 with necrotic L great toe with osteomyelitis.SABRA  Pharmacy has been consulted for Vancomycin  and Zosyn  dosing.  Plan: Zosyn  3.375gm IV q8h Vancomycin  1500 mg IV Q 24 hrs. Goal AUC 400-550. Expected AUC: 454, SCr used: 0.98 Will f/u renal function, micro data, and pt's clinical condition Vanc levels prn      Temp (24hrs), Avg:98.1 F (36.7 C), Min:98 F (36.7 C), Max:98.2 F (36.8 C)  Recent Labs  Lab 02/22/24 1454 02/27/24 1726  WBC 12.7* 17.5*  CREATININE 0.91 0.98    Estimated Creatinine Clearance: 70.8 mL/min (by C-G formula based on SCr of 0.98 mg/dL).    Allergies  Allergen Reactions   Cefepime  Hives    Had allergic reaction to one of these 3 agents, unclear which one   Metoprolol  Hives    Had allergic reaction to one of these 3 agents, unclear which one  *Per RN, highly likely this is the cause of allergic rxn   Myrbetriq  [Mirabegron ] Hives    Had allergic reaction to one of these 3 agents, unclear which one    Antimicrobials this admission: 7/19 Vanc >>  7/19 Zosyn  >>   Microbiology results: 7/19  BCx:   Thank you for allowing pharmacy to be a part of this patient's care.  Vito Ralph, PharmD, BCPS Please see amion for complete clinical pharmacist phone list 02/28/2024 1:06 AM

## 2024-02-29 ENCOUNTER — Encounter (HOSPITAL_COMMUNITY)

## 2024-02-29 ENCOUNTER — Telehealth: Payer: Self-pay

## 2024-02-29 ENCOUNTER — Encounter (HOSPITAL_COMMUNITY)
Admission: EM | Disposition: A | Discharge status: Home / Self Care | Payer: Self-pay | Source: Home / Self Care | Attending: Internal Medicine

## 2024-02-29 ENCOUNTER — Inpatient Hospital Stay (HOSPITAL_COMMUNITY): Admitting: Anesthesiology

## 2024-02-29 ENCOUNTER — Other Ambulatory Visit: Payer: Self-pay

## 2024-02-29 ENCOUNTER — Inpatient Hospital Stay (HOSPITAL_COMMUNITY)

## 2024-02-29 DIAGNOSIS — L97422 Non-pressure chronic ulcer of left heel and midfoot with fat layer exposed: Secondary | ICD-10-CM

## 2024-02-29 DIAGNOSIS — M86172 Other acute osteomyelitis, left ankle and foot: Secondary | ICD-10-CM

## 2024-02-29 DIAGNOSIS — I2510 Atherosclerotic heart disease of native coronary artery without angina pectoris: Secondary | ICD-10-CM | POA: Diagnosis not present

## 2024-02-29 DIAGNOSIS — E1169 Type 2 diabetes mellitus with other specified complication: Secondary | ICD-10-CM

## 2024-02-29 DIAGNOSIS — I739 Peripheral vascular disease, unspecified: Secondary | ICD-10-CM | POA: Diagnosis not present

## 2024-02-29 DIAGNOSIS — L97522 Non-pressure chronic ulcer of other part of left foot with fat layer exposed: Secondary | ICD-10-CM

## 2024-02-29 DIAGNOSIS — I10 Essential (primary) hypertension: Secondary | ICD-10-CM | POA: Diagnosis not present

## 2024-02-29 DIAGNOSIS — L03119 Cellulitis of unspecified part of limb: Secondary | ICD-10-CM | POA: Diagnosis not present

## 2024-02-29 DIAGNOSIS — Z794 Long term (current) use of insulin: Secondary | ICD-10-CM

## 2024-02-29 DIAGNOSIS — M869 Osteomyelitis, unspecified: Secondary | ICD-10-CM | POA: Diagnosis not present

## 2024-02-29 DIAGNOSIS — E11628 Type 2 diabetes mellitus with other skin complications: Secondary | ICD-10-CM | POA: Diagnosis not present

## 2024-02-29 HISTORY — PX: AMPUTATION TOE: SHX6595

## 2024-02-29 LAB — GLUCOSE, CAPILLARY
Glucose-Capillary: 177 mg/dL — ABNORMAL HIGH (ref 70–99)
Glucose-Capillary: 186 mg/dL — ABNORMAL HIGH (ref 70–99)
Glucose-Capillary: 198 mg/dL — ABNORMAL HIGH (ref 70–99)
Glucose-Capillary: 199 mg/dL — ABNORMAL HIGH (ref 70–99)
Glucose-Capillary: 335 mg/dL — ABNORMAL HIGH (ref 70–99)
Glucose-Capillary: 339 mg/dL — ABNORMAL HIGH (ref 70–99)
Glucose-Capillary: 414 mg/dL — ABNORMAL HIGH (ref 70–99)

## 2024-02-29 LAB — MAGNESIUM: Magnesium: 1.4 mg/dL — ABNORMAL LOW (ref 1.7–2.4)

## 2024-02-29 LAB — RENAL FUNCTION PANEL
Albumin: 2.2 g/dL — ABNORMAL LOW (ref 3.5–5.0)
Anion gap: 11 (ref 5–15)
BUN: 11 mg/dL (ref 8–23)
CO2: 25 mmol/L (ref 22–32)
Calcium: 8.5 mg/dL — ABNORMAL LOW (ref 8.9–10.3)
Chloride: 98 mmol/L (ref 98–111)
Creatinine, Ser: 0.74 mg/dL (ref 0.61–1.24)
GFR, Estimated: >60 mL/min (ref >60)
Glucose, Bld: 177 mg/dL — ABNORMAL HIGH (ref 70–99)
Phosphorus: 3.2 mg/dL (ref 2.5–4.6)
Potassium: 3.6 mmol/L (ref 3.5–5.1)
Sodium: 134 mmol/L — ABNORMAL LOW (ref 135–145)

## 2024-02-29 LAB — CBC WITH DIFFERENTIAL/PLATELET
Abs Immature Granulocytes: 0.06 K/uL (ref 0.00–0.07)
Basophils Absolute: 0.1 K/uL (ref 0.0–0.1)
Basophils Relative: 1 %
Eosinophils Absolute: 0.4 K/uL (ref 0.0–0.5)
Eosinophils Relative: 3 %
HCT: 38 % — ABNORMAL LOW (ref 39.0–52.0)
Hemoglobin: 13.3 g/dL (ref 13.0–17.0)
Immature Granulocytes: 0 %
Lymphocytes Relative: 16 %
Lymphs Abs: 2.1 K/uL (ref 0.7–4.0)
MCH: 32.8 pg (ref 26.0–34.0)
MCHC: 35 g/dL (ref 30.0–36.0)
MCV: 93.8 fL (ref 80.0–100.0)
Monocytes Absolute: 1.1 K/uL — ABNORMAL HIGH (ref 0.1–1.0)
Monocytes Relative: 8 %
Neutro Abs: 9.9 K/uL — ABNORMAL HIGH (ref 1.7–7.7)
Neutrophils Relative %: 72 %
Platelets: 232 K/uL (ref 150–400)
RBC: 4.05 MIL/uL — ABNORMAL LOW (ref 4.22–5.81)
RDW: 12.4 % (ref 11.5–15.5)
WBC: 13.7 K/uL — ABNORMAL HIGH (ref 4.0–10.5)
nRBC: 0 % (ref 0.0–0.2)

## 2024-02-29 SURGERY — AMPUTATION, TOE
Anesthesia: Monitor Anesthesia Care | Site: Toe | Laterality: Left

## 2024-02-29 MED ORDER — ORAL CARE MOUTH RINSE: 15.0000 mL | Freq: Once | OROMUCOSAL | Status: AC

## 2024-02-29 MED ORDER — ACETAMINOPHEN 10 MG/ML IV SOLN: 1000.0000 mg | Freq: Once | INTRAVENOUS | Status: DC | PRN

## 2024-02-29 MED ORDER — LIDOCAINE HCL (PF) 1 % IJ SOLN
INTRAMUSCULAR | Status: DC | PRN
  Administered 2024-02-29: 20 mL via INTRAMUSCULAR

## 2024-02-29 MED ORDER — ONDANSETRON HCL 4 MG/2ML IJ SOLN
INTRAMUSCULAR | Status: AC
  Filled 2024-02-29: qty 2

## 2024-02-29 MED ORDER — LORATADINE 10 MG PO TABS
10.0000 mg | ORAL_TABLET | Freq: Every day | ORAL | Status: DC
  Administered 2024-02-29 – 2024-03-04 (×5): 10 mg via ORAL
  Filled 2024-02-29 (×5): qty 1

## 2024-02-29 MED ORDER — ONDANSETRON HCL 4 MG/2ML IJ SOLN
INTRAMUSCULAR | Status: DC | PRN
  Administered 2024-02-29: 4 mg via INTRAVENOUS

## 2024-02-29 MED ORDER — SODIUM CHLORIDE 0.9 % IR SOLN
Status: DC | PRN
  Administered 2024-02-29: 1000 mL

## 2024-02-29 MED ORDER — PROPOFOL 1000 MG/100ML IV EMUL
INTRAVENOUS | Status: AC
  Filled 2024-02-29: qty 100

## 2024-02-29 MED ORDER — CHLORHEXIDINE GLUCONATE 0.12 % MT SOLN
OROMUCOSAL | Status: AC
  Administered 2024-02-29: 15 mL via OROMUCOSAL
  Filled 2024-02-29: qty 15

## 2024-02-29 MED ORDER — INSULIN ASPART 100 UNIT/ML IJ SOLN
0.0000 [IU] | INTRAMUSCULAR | Status: DC | PRN
  Administered 2024-02-29: 2 [IU] via SUBCUTANEOUS

## 2024-02-29 MED ORDER — LACTATED RINGERS IV SOLN: INTRAVENOUS | Status: DC

## 2024-02-29 MED ORDER — LIDOCAINE HCL 1 % IJ SOLN
INTRAMUSCULAR | Status: AC
Start: 2024-02-29 — End: 2024-02-29
  Filled 2024-02-29: qty 20

## 2024-02-29 MED ORDER — CHLORHEXIDINE GLUCONATE 0.12 % MT SOLN: 15.0000 mL | Freq: Once | OROMUCOSAL | Status: AC

## 2024-02-29 MED ORDER — PROPOFOL 500 MG/50ML IV EMUL
INTRAVENOUS | Status: DC | PRN
  Administered 2024-02-29: 100 ug/kg/min via INTRAVENOUS
  Administered 2024-02-29: 20 mg via INTRAVENOUS
  Administered 2024-02-29: 50 mg via INTRAVENOUS

## 2024-02-29 MED ORDER — FENTANYL CITRATE (PF) 100 MCG/2ML IJ SOLN: 25.0000 ug | INTRAMUSCULAR | Status: DC | PRN

## 2024-02-29 MED ORDER — BUPIVACAINE HCL (PF) 0.5 % IJ SOLN
INTRAMUSCULAR | Status: AC
  Filled 2024-02-29: qty 30

## 2024-02-29 SURGICAL SUPPLY — 36 items
BLADE SURG 10 STRL SS (BLADE) ×1 IMPLANT
BLADE SURG 15 STRL LF DISP TIS (BLADE) ×1 IMPLANT
BLADE SW THK.38XMED NAR THN (BLADE) IMPLANT
BNDG COHESIVE 3X5 TAN ST LF (GAUZE/BANDAGES/DRESSINGS) ×1 IMPLANT
BNDG COMPR ESMARK 4X3 LF (GAUZE/BANDAGES/DRESSINGS) ×1 IMPLANT
BNDG ELASTIC 3INX 5YD STR LF (GAUZE/BANDAGES/DRESSINGS) ×1 IMPLANT
BNDG ELASTIC 4INX 5YD STR LF (GAUZE/BANDAGES/DRESSINGS) IMPLANT
BNDG GAUZE DERMACEA FLUFF 4 (GAUZE/BANDAGES/DRESSINGS) IMPLANT
CHLORAPREP W/TINT 26 (MISCELLANEOUS) IMPLANT
DRSG ADAPTIC 3X8 NADH LF (GAUZE/BANDAGES/DRESSINGS) IMPLANT
DRSG XEROFORM 1X8 (GAUZE/BANDAGES/DRESSINGS) IMPLANT
ELECTRODE REM PT RTRN 9FT ADLT (ELECTROSURGICAL) ×1 IMPLANT
GAUZE PAD ABD 8X10 STRL (GAUZE/BANDAGES/DRESSINGS) IMPLANT
GAUZE SPONGE 2X2 STRL 8-PLY (GAUZE/BANDAGES/DRESSINGS) IMPLANT
GAUZE SPONGE 4X4 12PLY STRL (GAUZE/BANDAGES/DRESSINGS) ×1 IMPLANT
GAUZE STRETCH 2X75IN STRL (MISCELLANEOUS) ×1 IMPLANT
GAUZE XEROFORM 1X8 LF (GAUZE/BANDAGES/DRESSINGS) ×1 IMPLANT
GLOVE BIO SURGEON STRL SZ7.5 (GLOVE) ×1 IMPLANT
GLOVE BIOGEL PI IND STRL 7.5 (GLOVE) ×1 IMPLANT
GOWN STRL REUS W/ TWL LRG LVL3 (GOWN DISPOSABLE) ×2 IMPLANT
KIT BASIN OR (CUSTOM PROCEDURE TRAY) ×1 IMPLANT
NDL HYPO 25X1 1.5 SAFETY (NEEDLE) ×1 IMPLANT
NEEDLE HYPO 25X1 1.5 SAFETY (NEEDLE) ×1 IMPLANT
PACK ORTHO EXTREMITY (CUSTOM PROCEDURE TRAY) ×1 IMPLANT
PADDING CAST ABS COTTON 4X4 ST (CAST SUPPLIES) ×2 IMPLANT
SET HNDPC FAN SPRY TIP SCT (DISPOSABLE) IMPLANT
SPIKE FLUID TRANSFER (MISCELLANEOUS) IMPLANT
STOCKINETTE 4X48 STRL (DRAPES) IMPLANT
SUT ETHILON 3 0 FSLX (SUTURE) IMPLANT
SUT PROLENE 3 0 PS 2 (SUTURE) IMPLANT
SUT PROLENE 4 0 PS 2 18 (SUTURE) IMPLANT
SYR CONTROL 10ML LL (SYRINGE) ×1 IMPLANT
TUBE CONNECTING 12X1/4 (SUCTIONS) IMPLANT
UNDERPAD 30X36 HEAVY ABSORB (UNDERPADS AND DIAPERS) ×1 IMPLANT
WATER STERILE IRR 1000ML POUR (IV SOLUTION) ×1 IMPLANT
YANKAUER SUCT BULB TIP NO VENT (SUCTIONS) IMPLANT

## 2024-02-29 NOTE — Progress Notes (Signed)
 Orthopedic Tech Progress Note Patient Details:  [name] 07/16/1956 [id number]  Patient has POST OP SHOE  Patient ID: JAKING THAYER, male   DOB: 06/08/1956, 68 y.o.   MRN: [id number]  Delanna LITTIE Pac 02/29/2024, 12:18 PM

## 2024-02-29 NOTE — Addendum Note (Signed)
 Encounter addended by: Leigh Ezzie HERO, RVT on: 02/29/2024 11:56 AM  Actions taken: Imaging Exam ended

## 2024-02-29 NOTE — Transfer of Care (Signed)
 Immediate Anesthesia Transfer of Care Note  Patient: [name]  Procedure(s) Performed: AMPUTATION, LEFT GREAT TOE (Left: Toe)  Patient Location: PACU  Anesthesia Type:MAC  Level of Consciousness: awake, alert , and oriented  Airway & Oxygen Therapy: Patient Spontanous Breathing  Post-op Assessment: Report given to RN, Post -op Vital signs reviewed and stable, Patient moving all extremities X 4, and Patient able to stick tongue midline  Post vital signs: Reviewed and stable  Last Vitals:  Vitals Value Taken Time  BP 127/55 02/29/24 11:00  Temp 98.2   Pulse 67 02/29/24 11:01  Resp 17 02/29/24 11:01  SpO2 100 % 02/29/24 11:01  Vitals shown include unfiled device data.  Last Pain:  Vitals:   02/29/24 0939  TempSrc:   PainSc: 6          Complications: No notable events documented.

## 2024-02-29 NOTE — Anesthesia Preprocedure Evaluation (Addendum)
 Anesthesia Evaluation  Patient identified by MRN, date of birth, ID band Patient awake    Reviewed: Allergy & Precautions, NPO status , Patient's Chart, lab work & pertinent test results  Airway Mallampati: II       Dental  (+) Edentulous Upper, Edentulous Lower   Pulmonary asthma , Current Smoker and Patient abstained from smoking.   Pulmonary exam normal        Cardiovascular hypertension, Pt. on medications + CAD and + Peripheral Vascular Disease  + dysrhythmias Atrial Fibrillation  Rhythm:Regular Rate:Normal     Neuro/Psych   Anxiety     negative neurological ROS     GI/Hepatic Neg liver ROS,GERD  Medicated,,  Endo/Other  diabetes, Type 2, Insulin  Dependent    Renal/GU negative Renal ROS  negative genitourinary   Musculoskeletal Osteomyelitis toe   Abdominal Normal abdominal exam  (+)   Peds  Hematology Lab Results      Component                Value               Date                      WBC                      17.2 (H)            02/28/2024                HGB                      13.9                02/28/2024                HCT                      38.6 (L)            02/28/2024                MCV                      92.1                02/28/2024                PLT                      270                 02/28/2024             Lab Results      Component                Value               Date                      NA                       133 (L)             02/28/2024                K  3.6                 02/28/2024                CO2                      26                  02/28/2024                GLUCOSE                  76                  02/28/2024                BUN                      9                   02/28/2024                CREATININE               0.83                02/28/2024                CALCIUM                   8.9                 02/28/2024                 GFR                      87.29               02/22/2024                GFRNONAA                 >60                 02/28/2024              Anesthesia Other Findings   Reproductive/Obstetrics                              Anesthesia Physical Anesthesia Plan  ASA: 3  Anesthesia Plan: MAC   Post-op Pain Management:    Induction: Intravenous  PONV Risk Score and Plan: Ondansetron , Dexamethasone , Propofol  infusion, Midazolam  and Treatment may vary due to age or medical condition  Airway Management Planned: Simple Face Mask and Nasal Cannula  Additional Equipment: None  Intra-op Plan:   Post-operative Plan:   Informed Consent: I have reviewed the patients History and Physical, chart, labs and discussed the procedure including the risks, benefits and alternatives for the proposed anesthesia with the patient or authorized representative who has indicated his/her understanding and acceptance.     Dental advisory given  Plan Discussed with: CRNA  Anesthesia Plan Comments:          Anesthesia Quick Evaluation

## 2024-02-29 NOTE — Progress Notes (Incomplete)
 PROGRESS NOTE    [name]  FMW:[id number] DOB: [birth year] DOA: 02/27/2024 PCP: Norleen Lynwood LELON, MD  Outpatient Specialists:     Brief Narrative:  Patient is a 68 year old male, noncompliant, past medical history significant for coronary artery disease, paroxysmal atrial fibrillation not on anticoagulation, PSVT, PAD, renal artery stenosis, type 2 diabetes mellitus on insulin  (been noncompliant), GERD, gout, hypertension, hyperlipidemia, bladder cancer status post TURP and tobacco abuse.  Patient was admitted recently with left fifth toe cellulitis/osteomyelitis leading to amputation of the left fifth toe.  Around 02/10/2024, worsening discoloration of the left foot was noted, with changes involving left great toe.  Patient was eventually found to have occluded left SFA, that was stented.  Patient underwent repeat stenting of the left SFA on 02/15/2024.  Patient is admitted with necrotic left great toe, with some cellulitic changes and swelling of the left lower leg/foot.  02/28/2024: Vascular surgery input is appreciated.  Vascular surgery team saw patient and recommended surgery for tomorrow, to be done alongside podiatry team.  Patient is on IV vancomycin  and Zosyn  for likely infection.  Purulent areas are noted at the heel area and other areas of the plantar surface.  Patient will likely benefit from I&D.    Assessment & Plan:   Principal Problem:   Osteomyelitis (HCC) Active Problems:   Essential hypertension   PAD (peripheral artery disease) (HCC)   Diabetes (HCC)   Cellulitis in diabetic foot (HCC)   Necrotic left great toe with osteomyelitis Left lower extremity cellulitis PAD status post recent left SFA stenting on 02/15/2024 Possible left foot abscess: -See above documentation. - Vascular surgery input is appreciated. -For likely surgery tomorrow. - Leukocytosis. - On IV vancomycin  and Zosyn . - Awaiting podiatry input. - Follow cultures.   Poorly controlled insulin -dependent  type 2 diabetes: -Patient is noncompliant.   - Glucose of greater than 500 on presentation.   - Blood sugars controlled.   - Query patient is financially challenged.   - Need to comply with medications discussed extensively with patient.   - Last A1c was 11.2.   Pseudohyponatremia/hyponatremia: -Sodium has improved from 127-133.   -Continue to monitor renal function and electrolytes.  CAD -Stable.     -EKG without acute ischemic changes.   PAF Not on anticoagulation.   GERD Continue Protonix .   Hypertension Currently normotensive.   Continue irbesartan .   Hyperlipidemia Continue Lipitor .   Mood disorder Continue Celexa .    DVT prophylaxis: SCD. Code Status: Full code Family Communication:  Disposition Plan: Inpatient.   Consultants:  Vascular surgery. Awaiting podiatry input.  Procedures:  For surgery tomorrow  Antimicrobials:  DC vancomycin . IV Zosyn .   Subjective: No new complaints. No fever or chills.  Objective: Vitals:   02/28/24 2005 02/29/24 0430 02/29/24 0715 02/29/24 0920  BP: (!) 153/66 (!) 133/54 (!) 129/53 133/68  Pulse: 70 (!) 59 61 67  Resp: 17 17 16 18   Temp: 98.5 F (36.9 C) 98.5 F (36.9 C) 98.2 F (36.8 C) 98.9 F (37.2 C)  TempSrc: Oral Oral Oral Oral  SpO2: 96% 99% 95% 93%  Weight:    72.5 kg  Height:    5' 8 (1.727 m)    Intake/Output Summary (Last 24 hours) at 02/29/2024 1027 Last data filed at 02/29/2024 1024 Gross per 24 hour  Intake 170 ml  Output 300 ml  Net -130 ml   Filed Weights   02/29/24 0920  Weight: 72.5 kg    Examination:  General exam: Appears calm  and comfortable  Respiratory system: Clear to auscultation.  Cardiovascular system: S1 & S2 Gastrointestinal system: Abdomen is soft and nontender.   Central nervous system: Awake and alert.   Extremities:                 Data Reviewed: I have personally reviewed following labs and imaging studies  CBC: Recent Labs  Lab  02/22/24 1454 02/27/24 1726 02/27/24 1732 02/28/24 0405 02/29/24 0655  WBC 12.7* 17.5*  --  17.2* 13.7*  NEUTROABS 9.6*  --   --   --  9.9*  HGB 14.9 14.7 14.3 13.9 13.3  HCT 43.0 41.1 42.0 38.6* 38.0*  MCV 96.0 93.0  --  92.1 93.8  PLT 357.0 272  --  270 232   Basic Metabolic Panel: Recent Labs  Lab 02/22/24 1454 02/27/24 1726 02/27/24 1732 02/28/24 0405 02/29/24 0655  NA 130* 127* 128* 133* 134*  K 4.0 3.9 3.9 3.6 3.6  CL 90* 91*  --  98 98  CO2 33* 24  --  26 25  GLUCOSE 484* 558*  --  76 177*  BUN 18 10  --  9 11  CREATININE 0.91 0.98  --  0.83 0.74  CALCIUM  9.3 8.8*  --  8.9 8.5*  MG  --   --   --   --  1.4*  PHOS  --   --   --   --  3.2   GFR: Estimated Creatinine Clearance: 86.7 mL/min (by C-G formula based on SCr of 0.74 mg/dL). Liver Function Tests: Recent Labs  Lab 02/22/24 1454 02/27/24 1726 02/29/24 0655  AST 11 20  --   ALT 11 13  --   ALKPHOS 91 98  --   BILITOT 0.7 1.1  --   PROT 6.5 6.9  --   ALBUMIN 3.4* 2.7* 2.2*   No results for input(s): LIPASE, AMYLASE in the last 168 hours. No results for input(s): AMMONIA in the last 168 hours. Coagulation Profile: No results for input(s): INR, PROTIME in the last 168 hours. Cardiac Enzymes: No results for input(s): CKTOTAL, CKMB, CKMBINDEX, TROPONINI in the last 168 hours. BNP (last 3 results) No results for input(s): PROBNP in the last 8760 hours. HbA1C: No results for input(s): HGBA1C in the last 72 hours. CBG: Recent Labs  Lab 02/28/24 1625 02/28/24 2003 02/29/24 0006 02/29/24 0411 02/29/24 0906  GLUCAP 336* 382* 339* 199* 177*   Lipid Profile: No results for input(s): CHOL, HDL, LDLCALC, TRIG, CHOLHDL, LDLDIRECT in the last 72 hours. Thyroid  Function Tests: No results for input(s): TSH, T4TOTAL, FREET4, T3FREE, THYROIDAB in the last 72 hours. Anemia Panel: No results for input(s): VITAMINB12, FOLATE, FERRITIN, TIBC, IRON,  RETICCTPCT in the last 72 hours. Urine analysis:    Component Value Date/Time   COLORURINE STRAW (A) 02/27/2024 1716   APPEARANCEUR CLEAR 02/27/2024 1716   LABSPEC 1.027 02/27/2024 1716   PHURINE 5.0 02/27/2024 1716   GLUCOSEU >=500 (A) 02/27/2024 1716   GLUCOSEU >=1000 (A) 02/22/2024 1454   HGBUR NEGATIVE 02/27/2024 1716   BILIRUBINUR NEGATIVE 02/27/2024 1716   KETONESUR NEGATIVE 02/27/2024 1716   PROTEINUR NEGATIVE 02/27/2024 1716   UROBILINOGEN 0.2 02/22/2024 1454   NITRITE NEGATIVE 02/27/2024 1716   LEUKOCYTESUR TRACE (A) 02/27/2024 1716   Sepsis Labs: @LABRCNTIP (procalcitonin:4,lacticidven:4)  ) Recent Results (from the past 240 hours)  Blood culture (routine x 2)     Status: None (Preliminary result)   Collection Time: 02/27/24  7:33 PM   Specimen: BLOOD RIGHT  ARM  Result Value Ref Range Status   Specimen Description BLOOD RIGHT ARM  Final   Special Requests   Final    BOTTLES DRAWN AEROBIC AND ANAEROBIC Blood Culture adequate volume   Culture   Final    NO GROWTH 2 DAYS Performed at [facility], [address], [place], [place] [address]    Report Status PENDING  Incomplete  Blood culture (routine x 2)     Status: None (Preliminary result)   Collection Time: 02/27/24  7:33 PM   Specimen: BLOOD  Result Value Ref Range Status   Specimen Description BLOOD BLOOD LEFT ARM  Final   Special Requests   Final    BOTTLES DRAWN AEROBIC AND ANAEROBIC Blood Culture adequate volume   Culture   Final    NO GROWTH 2 DAYS Performed at [facility], [address], [place], [place] [address]    Report Status PENDING  Incomplete  Surgical PCR screen     Status: None   Collection Time: 02/28/24 12:26 PM   Specimen: Nasal Mucosa; Nasal Swab  Result Value Ref Range Status   MRSA, PCR NEGATIVE NEGATIVE Final   Staphylococcus aureus NEGATIVE NEGATIVE Final    Comment: (NOTE) The Xpert SA Assay (FDA approved for NASAL specimens in patients 54 years of age and  older), is one component of a comprehensive surveillance program. It is not intended to diagnose infection nor to guide or monitor treatment. Performed at [facility], [address] [place], [place] [address]          Radiology Studies: DG Foot Complete Left Result Date: 02/27/2024 EXAM: 3 or more VIEW(S) XRAY OF THE LEFT FOOT 02/27/2024 07:23:23 PM COMPARISON: Outside facility radiographs dated 02/22/2024. CLINICAL HISTORY: Great toe necrosis. FINDINGS: BONES AND JOINTS: Status post fifth digit amputation at the MTP joint. Mild lucency and cortical destruction involving the first distal tuft with associated soft tissue gas, suggesting osteomyelitis. SOFT TISSUES: Soft tissue gas noted in the region of the first distal tuft. IMPRESSION: 1. Suspected osteomyelitis involving the 1st distal tuft. 2. Status post fifth digit amputation at the MTP joint. Electronically signed by: Pinkie Pebbles MD 02/27/2024 07:40 PM EDT RP Workstation: [id number]        Scheduled Meds:  [FJM Hold] atorvastatin   40 mg Oral Daily   [MAR Hold] citalopram   20 mg Oral Daily   [MAR Hold] insulin  aspart  0-9 Units Subcutaneous Q4H   [MAR Hold] irbesartan   300 mg Oral Daily   [MAR Hold] mupirocin  ointment  1 Application Nasal BID   [MAR Hold] pantoprazole   40 mg Oral Daily   Continuous Infusions:  lactated ringers  10 mL/hr at 02/29/24 1010   [MAR Hold] piperacillin -tazobactam (ZOSYN )  IV 3.375 g (02/29/24 0934)   [MAR Hold] vancomycin  1,500 mg (02/28/24 2028)     LOS: 2 days    Time spent: 55 minutes.    Leatrice Chapel, MD  [facility] Pager #: [phone] 7PM-7AM contact night coverage as above

## 2024-02-29 NOTE — Progress Notes (Signed)
 PROGRESS NOTE    [name]  FMW:[id number] DOB: August 09, 1956 DOA: 02/27/2024 PCP: Norleen Lynwood LELON, MD  Outpatient Specialists:     Brief Narrative:  Patient is a 68 year old male, noncompliant, past medical history significant for coronary artery disease, paroxysmal atrial fibrillation not on anticoagulation, PSVT, PAD, renal artery stenosis, type 2 diabetes mellitus on insulin  (been noncompliant), GERD, gout, hypertension, hyperlipidemia, bladder cancer status post TURP and tobacco abuse.  Patient was admitted recently with left fifth toe cellulitis/osteomyelitis leading to amputation of the left fifth toe.  Around 02/10/2024, worsening discoloration of the left foot was noted, with changes involving left great toe.  Patient was eventually found to have occluded left SFA, that was stented.  Patient underwent repeat stenting of the left SFA on 02/15/2024.  Patient is admitted with necrotic left great toe, with some cellulitic changes and swelling of the left lower leg/foot.  02/28/2024: Vascular surgery input is appreciated.  Vascular surgery team saw patient and recommended surgery for tomorrow, to be done alongside podiatry team.  Patient is on IV vancomycin  and Zosyn  for likely infection.  Purulent areas are noted at the heel area and other areas of the plantar surface.  Patient will likely benefit from I&D.    02/29/2024: Patient underwent amputation of the left great toe, irrigation and excisional debridement of ulceration 1 x 1 cm subcutaneous fat tissue level, left foot.  Podiatry team input is appreciated.  Postop care as per podiatry team.  Assessment & Plan:   Principal Problem:   Osteomyelitis (HCC) Active Problems:   Essential hypertension   PAD (peripheral artery disease) (HCC)   Diabetes (HCC)   Cellulitis in diabetic foot (HCC)   Necrotic left great toe with osteomyelitis Left lower extremity cellulitis PAD status post recent left SFA stenting on 02/15/2024 Possible left foot  abscess: -See above documentation. - Vascular surgery input is appreciated. - Patient underwent amputation today (see above). - Postop care as per podiatry team.   - Patient is on IV vancomycin  and Zosyn .   Poorly controlled insulin -dependent type 2 diabetes: -Patient is noncompliant.   - Glucose of greater than 500 on presentation.   - Blood sugars controlled.   - Query patient is financially challenged.   - Need to comply with medications discussed extensively with patient.   - Last A1c was 11.2.   Pseudohyponatremia/hyponatremia: -Sodium has improved from 127-133.   - Level of 134 today.    CAD -Stable.     -EKG without acute ischemic changes.   PAF Not on anticoagulation.   GERD Continue Protonix .   Hypertension Currently normotensive.   Continue irbesartan .   Hyperlipidemia Continue Lipitor .   Mood disorder Continue Celexa .    DVT prophylaxis: SCD. Code Status: Full code Family Communication:  Disposition Plan: Inpatient.   Consultants:  Vascular surgery. Awaiting podiatry input.  Procedures:  For surgery tomorrow  Antimicrobials:  DC vancomycin . IV Zosyn .   Subjective: No new complaints. No fever or chills.  Objective: Vitals:   02/28/24 2005 02/29/24 0430 02/29/24 0715 02/29/24 0920  BP: (!) 153/66 (!) 133/54 (!) 129/53 133/68  Pulse: 70 (!) 59 61 67  Resp: 17 17 16 18   Temp: 98.5 F (36.9 C) 98.5 F (36.9 C) 98.2 F (36.8 C) 98.9 F (37.2 C)  TempSrc: Oral Oral Oral Oral  SpO2: 96% 99% 95% 93%  Weight:    72.5 kg  Height:    5' 8 (1.727 m)    Intake/Output Summary (Last 24 hours) at 02/29/2024  1028 Last data filed at 02/29/2024 1024 Gross per 24 hour  Intake 170 ml  Output 300 ml  Net -130 ml   Filed Weights   02/29/24 0920  Weight: 72.5 kg    Examination:  General exam: Appears calm and comfortable  Respiratory system: Clear to auscultation.  Cardiovascular system: S1 & S2 Gastrointestinal system: Abdomen is soft  and nontender.   Central nervous system: Awake and alert.   Extremities:                 Data Reviewed: I have personally reviewed following labs and imaging studies  CBC: Recent Labs  Lab 02/22/24 1454 02/27/24 1726 02/27/24 1732 02/28/24 0405 02/29/24 0655  WBC 12.7* 17.5*  --  17.2* 13.7*  NEUTROABS 9.6*  --   --   --  9.9*  HGB 14.9 14.7 14.3 13.9 13.3  HCT 43.0 41.1 42.0 38.6* 38.0*  MCV 96.0 93.0  --  92.1 93.8  PLT 357.0 272  --  270 232   Basic Metabolic Panel: Recent Labs  Lab 02/22/24 1454 02/27/24 1726 02/27/24 1732 02/28/24 0405 02/29/24 0655  NA 130* 127* 128* 133* 134*  K 4.0 3.9 3.9 3.6 3.6  CL 90* 91*  --  98 98  CO2 33* 24  --  26 25  GLUCOSE 484* 558*  --  76 177*  BUN 18 10  --  9 11  CREATININE 0.91 0.98  --  0.83 0.74  CALCIUM  9.3 8.8*  --  8.9 8.5*  MG  --   --   --   --  1.4*  PHOS  --   --   --   --  3.2   GFR: Estimated Creatinine Clearance: 86.7 mL/min (by C-G formula based on SCr of 0.74 mg/dL). Liver Function Tests: Recent Labs  Lab 02/22/24 1454 02/27/24 1726 02/29/24 0655  AST 11 20  --   ALT 11 13  --   ALKPHOS 91 98  --   BILITOT 0.7 1.1  --   PROT 6.5 6.9  --   ALBUMIN 3.4* 2.7* 2.2*   No results for input(s): LIPASE, AMYLASE in the last 168 hours. No results for input(s): AMMONIA in the last 168 hours. Coagulation Profile: No results for input(s): INR, PROTIME in the last 168 hours. Cardiac Enzymes: No results for input(s): CKTOTAL, CKMB, CKMBINDEX, TROPONINI in the last 168 hours. BNP (last 3 results) No results for input(s): PROBNP in the last 8760 hours. HbA1C: No results for input(s): HGBA1C in the last 72 hours. CBG: Recent Labs  Lab 02/28/24 1625 02/28/24 2003 02/29/24 0006 02/29/24 0411 02/29/24 0906  GLUCAP 336* 382* 339* 199* 177*   Lipid Profile: No results for input(s): CHOL, HDL, LDLCALC, TRIG, CHOLHDL, LDLDIRECT in the last 72 hours. Thyroid  Function  Tests: No results for input(s): TSH, T4TOTAL, FREET4, T3FREE, THYROIDAB in the last 72 hours. Anemia Panel: No results for input(s): VITAMINB12, FOLATE, FERRITIN, TIBC, IRON, RETICCTPCT in the last 72 hours. Urine analysis:    Component Value Date/Time   COLORURINE STRAW (A) 02/27/2024 1716   APPEARANCEUR CLEAR 02/27/2024 1716   LABSPEC 1.027 02/27/2024 1716   PHURINE 5.0 02/27/2024 1716   GLUCOSEU >=500 (A) 02/27/2024 1716   GLUCOSEU >=1000 (A) 02/22/2024 1454   HGBUR NEGATIVE 02/27/2024 1716   BILIRUBINUR NEGATIVE 02/27/2024 1716   KETONESUR NEGATIVE 02/27/2024 1716   PROTEINUR NEGATIVE 02/27/2024 1716   UROBILINOGEN 0.2 02/22/2024 1454   NITRITE NEGATIVE 02/27/2024 1716   LEUKOCYTESUR TRACE (  A) 02/27/2024 1716   Sepsis Labs: @LABRCNTIP (procalcitonin:4,lacticidven:4)  ) Recent Results (from the past 240 hours)  Blood culture (routine x 2)     Status: None (Preliminary result)   Collection Time: 02/27/24  7:33 PM   Specimen: BLOOD RIGHT ARM  Result Value Ref Range Status   Specimen Description BLOOD RIGHT ARM  Final   Special Requests   Final    BOTTLES DRAWN AEROBIC AND ANAEROBIC Blood Culture adequate volume   Culture   Final    NO GROWTH 2 DAYS Performed at [facility], [address], [place], [place] [address]    Report Status PENDING  Incomplete  Blood culture (routine x 2)     Status: None (Preliminary result)   Collection Time: 02/27/24  7:33 PM   Specimen: BLOOD  Result Value Ref Range Status   Specimen Description BLOOD BLOOD LEFT ARM  Final   Special Requests   Final    BOTTLES DRAWN AEROBIC AND ANAEROBIC Blood Culture adequate volume   Culture   Final    NO GROWTH 2 DAYS Performed at [facility], [address], [place], [place] [address]    Report Status PENDING  Incomplete  Surgical PCR screen     Status: None   Collection Time: 02/28/24 12:26 PM   Specimen: Nasal Mucosa; Nasal Swab  Result Value Ref Range Status    MRSA, PCR NEGATIVE NEGATIVE Final   Staphylococcus aureus NEGATIVE NEGATIVE Final    Comment: (NOTE) The Xpert SA Assay (FDA approved for NASAL specimens in patients 76 years of age and older), is one component of a comprehensive surveillance program. It is not intended to diagnose infection nor to guide or monitor treatment. Performed at [facility], [address] [place], [place] [address]          Radiology Studies: DG Foot Complete Left Result Date: 02/27/2024 EXAM: 3 or more VIEW(S) XRAY OF THE LEFT FOOT 02/27/2024 07:23:23 PM COMPARISON: Outside facility radiographs dated 02/22/2024. CLINICAL HISTORY: Great toe necrosis. FINDINGS: BONES AND JOINTS: Status post fifth digit amputation at the MTP joint. Mild lucency and cortical destruction involving the first distal tuft with associated soft tissue gas, suggesting osteomyelitis. SOFT TISSUES: Soft tissue gas noted in the region of the first distal tuft. IMPRESSION: 1. Suspected osteomyelitis involving the 1st distal tuft. 2. Status post fifth digit amputation at the MTP joint. Electronically signed by: Pinkie Pebbles MD 02/27/2024 07:40 PM EDT RP Workstation: [id number]        Scheduled Meds:  [FJM Hold] atorvastatin   40 mg Oral Daily   [MAR Hold] citalopram   20 mg Oral Daily   [MAR Hold] insulin  aspart  0-9 Units Subcutaneous Q4H   [MAR Hold] irbesartan   300 mg Oral Daily   [MAR Hold] mupirocin  ointment  1 Application Nasal BID   [MAR Hold] pantoprazole   40 mg Oral Daily   Continuous Infusions:  lactated ringers  10 mL/hr at 02/29/24 1010   [MAR Hold] piperacillin -tazobactam (ZOSYN )  IV 3.375 g (02/29/24 0934)   [MAR Hold] vancomycin  1,500 mg (02/28/24 2028)     LOS: 2 days    Time spent: 35 minutes.    Leatrice Chapel, MD  [facility] Pager #: [phone] 7PM-7AM contact night coverage as above

## 2024-02-29 NOTE — Addendum Note (Signed)
 Encounter addended by: Leigh Ezzie HERO, RVT on: 02/29/2024 11:55 AM  Actions taken: Imaging Exam ended
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
## 2024-02-29 NOTE — Op Note (Signed)
 Full Operative Report  Date of Operation: 10:06 AM, 02/29/2024   Patient: [name] - 68 y.o. 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 Amputation of left great toe through proximal phalanx head, left foot 2. Irrigation and excisional debridement of ulceration 1x1 cm to subcutaneous fat tissue level, left foot    Anesthesia: Anesthesia type not filed in the log.  No responsible provider has been recorded for the case.  Anesthesiologist: Stoltzfus, Cordella SQUIBB, DO CRNA: Harrold Macintosh, CRNA   Estimated Blood Loss: 20 mL  Hemostasis: 1) Anatomical dissection, mechanical compression, electrocautery 2) No tourniquet was used  Implants: * No implants in log *  Materials: proelene 3-0  Injectables: 1) Pre-operatively: 10 cc of 50:50 mixture 1%lidocaine  plain and 0.5% marcaine  plain 2) Post-operatively: 10 cc of 50:50 mixture 1%lidocaine  plain and 0.5% marcaine  plain  Specimens: - Pathology: Left hallux for pathology - Microbiology: bone culture distal phalanx left hallux   Antibiotics: IV antibiotics given per schedule on the floor  Drains: None  Complications: Patient tolerated the procedure well without complication.   Operative findings: As below in detailed report  Indications for Procedure: [name] presents to [place], Marsa FALCON, DPM with a chief complaint of gangrene and osteomyelitis of the left hallux distal phalanx. Also with left heel ulcer. The patient has failed conservative treatments of various modalities. At this time the patient has elected to proceed with surgical correction. All alternatives, risks, and complications of the procedures were thoroughly explained to the patient. Patient exhibits appropriate understanding of all discussion points and informed consent was signed and obtained in the chart with no guarantees to surgical  outcome given or implied.  Description of Procedure: Patient was brought to the operating room. Patient remained on their hospital bed in the supine position. A surgical timeout was performed and all members of the operating room, the procedure, and the surgical site were identified. anesthesia occurred as per anesthesia record. Local anesthetic as previously described was then injected about the operative field in a local infiltrative block.  The operative lower extremity as noted above was then prepped and draped in the usual sterile manner. The following procedure then began.  Attention was directed to the first digit on the LEFT foot. A full-thickness incision encompassing the entire digit was made using a #15 blade. Dissection was carried down to bone. The toe was secured with a towel clamp, further dissected in its entirety, and disarticulated at the hallux IPJ and passed to the back table as a gross specimen. This was then labled and sent to pathology. The bone was noted to be soft and eroded, and consistent with osteomyelitis distally. A bone culture was taken from the distal phalanx with rongeur and sent for culture. Next for closure purposes, the head of the proximal phalanx was resected with a sagittal saw. All remaining necrotic and devitalized soft tissue structures were visualized and dissected away using sharp and dull dissection. Care was taken to protect all neurovascular structures throughout the dissection. All bleeders were cauterized as necessary.  The area was then flushed with copious amounts of sterile saline. Then using the suture materials previously described, the site was closed in anatomic layers and the skin was well approximated under minimal tension.  Attention was then directed to the left heel. There was noted to be a small 1x1 area of necrotic tissue on the left  heel. There was evidence of some prior blistering in the area that appears to have auto debrided yielding healthy  underlyign tissue with no large open wound or evidence of sinus tract or abscess. The area of necrotic tissue was then debrided excisionally with 15 blade scalpel to subcutanoues fat tissue layer with healthy bleeding tissue bed. Wound measured 1x1x0.3 cm post debridement. Irrigation was performed and hemostasis was achieved with electrocautery.   The surgical site was then dressed with xerform 4x4 kerlix and ace wrap. The patient tolerated both the procedure and anesthesia well with vital signs stable throughout. The patient was transferred in good condition and all vital signs stable  from the OR to recovery under the discretion of anesthesia.  Condition: Vital signs stable, neurovascular status unchanged from preoperative   Surgical plan:  Expect clean margin at the left hallux. Good bleeding noted at the heel and hallux amp site. No abscess or deep wound on the heel, more so scattered areas of necrotic tissue. Will be stable for dc on po abx likely by tomorrow. WBAT in post op shoe to the left foot.  The patient will be WBAT in a post op shoe to the operative limb until further instructed. The dressing is to remain clean, dry, and intact. Will continue to follow unless noted elsewhere.   Marsa Honour, DPM [facility]

## 2024-02-29 NOTE — Inpatient Diabetes Management (Signed)
 Inpatient Diabetes Program Recommendations  AACE/ADA: New Consensus Statement on Inpatient Glycemic Control (2015)  Target Ranges:  Prepandial:   less than 140 mg/dL      Peak postprandial:   less than 180 mg/dL (1-2 hours)      Critically ill patients:  140 - 180 mg/dL   Lab Results  Component Value Date   GLUCAP 177 (H) 02/29/2024   HGBA1C 11.2 (H) 02/22/2024    Review of Glycemic Control  Latest Reference Range & Units 02/28/24 12:11 02/28/24 16:25 02/28/24 20:03 02/29/24 00:06 02/29/24 04:11 02/29/24 09:06  Glucose-Capillary 70 - 99 mg/dL 863 (H) 663 (H) 617 (H) 339 (H) 199 (H) 177 (H)  (H): Data is abnormally high Diabetes history: Type 2 DM Outpatient Diabetes medications: Tresiba 100 units QD Current orders for Inpatient glycemic control: Novolog  0-14 units Q2H PRN  Inpatient Diabetes Program Recommendations:    Consider adding: -Semglee  15 units every day -Novolog  4 units TID (assuming once meals intake resumes and patient consuming >50% for meals)  Thanks, Tinnie Minus, MSN, RNC-OB Diabetes Coordinator [phone] (8a-5p)

## 2024-02-29 NOTE — Anesthesia Postprocedure Evaluation (Signed)
 Anesthesia Post Note  Patient: [name]  Procedure(s) Performed: AMPUTATION, LEFT GREAT TOE (Left: Toe)     Patient location during evaluation: PACU Anesthesia Type: MAC Level of consciousness: awake and alert Pain management: pain level controlled Vital Signs Assessment: post-procedure vital signs reviewed and stable Respiratory status: spontaneous breathing, nonlabored ventilation, respiratory function stable and patient connected to nasal cannula oxygen Cardiovascular status: stable and blood pressure returned to baseline Postop Assessment: no apparent nausea or vomiting Anesthetic complications: no   No notable events documented.  Last Vitals:  Vitals:   02/29/24 1115 02/29/24 1148  BP: 139/72 (!) 157/84  Pulse: 68 69  Resp: 18 15  Temp: 36.8 C 36.6 C  SpO2: 98% 98%    Last Pain:  Vitals:   02/29/24 1152  TempSrc:   PainSc: 6                  Ovid Witman P Jonathon Castelo

## 2024-02-29 NOTE — Progress Notes (Signed)
 History and Physical Interval Note:  02/29/2024 10:02 AM  [name]  has presented today for surgery, with the diagnosis of gangrene and osteomyelitis of left great toe, ulceration of left heel.  The various methods of treatment have been discussed with the patient and family. After consideration of risks, benefits and other options for treatment, the patient has consented to   Procedure(s) with comments: AMPUTATION, TOE (Left) - Left Great toe partial amputation,  Debridement Left Heel as a surgical intervention.  The patient's history has been reviewed, patient examined, no change in status, stable for surgery.  I have reviewed the patient's chart and labs.  Questions were answered to the patient's satisfaction.     [name]

## 2024-03-01 ENCOUNTER — Inpatient Hospital Stay (HOSPITAL_COMMUNITY)

## 2024-03-01 ENCOUNTER — Encounter (HOSPITAL_COMMUNITY): Payer: Self-pay | Admitting: Podiatry

## 2024-03-01 ENCOUNTER — Telehealth: Payer: Self-pay

## 2024-03-01 ENCOUNTER — Other Ambulatory Visit: Payer: Self-pay

## 2024-03-01 DIAGNOSIS — M62262 Nontraumatic ischemic infarction of muscle, left lower leg: Secondary | ICD-10-CM

## 2024-03-01 DIAGNOSIS — M86172 Other acute osteomyelitis, left ankle and foot: Secondary | ICD-10-CM | POA: Diagnosis not present

## 2024-03-01 LAB — GLUCOSE, CAPILLARY
Glucose-Capillary: 115 mg/dL — ABNORMAL HIGH (ref 70–99)
Glucose-Capillary: 165 mg/dL — ABNORMAL HIGH (ref 70–99)
Glucose-Capillary: 213 mg/dL — ABNORMAL HIGH (ref 70–99)
Glucose-Capillary: 284 mg/dL — ABNORMAL HIGH (ref 70–99)
Glucose-Capillary: 327 mg/dL — ABNORMAL HIGH (ref 70–99)
Glucose-Capillary: 352 mg/dL — ABNORMAL HIGH (ref 70–99)
Glucose-Capillary: 362 mg/dL — ABNORMAL HIGH (ref 70–99)
Glucose-Capillary: 406 mg/dL — ABNORMAL HIGH (ref 70–99)

## 2024-03-01 LAB — CBC
HCT: 41.1 % (ref 39.0–52.0)
Hemoglobin: 14.5 g/dL (ref 13.0–17.0)
MCH: 32.5 pg (ref 26.0–34.0)
MCHC: 35.3 g/dL (ref 30.0–36.0)
MCV: 92.2 fL (ref 80.0–100.0)
Platelets: 271 K/uL (ref 150–400)
RBC: 4.46 MIL/uL (ref 4.22–5.81)
RDW: 12.4 % (ref 11.5–15.5)
WBC: 17 K/uL — ABNORMAL HIGH (ref 4.0–10.5)
nRBC: 0 % (ref 0.0–0.2)

## 2024-03-01 LAB — BASIC METABOLIC PANEL WITH GFR
Anion gap: 9 (ref 5–15)
BUN: 13 mg/dL (ref 8–23)
CO2: 25 mmol/L (ref 22–32)
Calcium: 9 mg/dL (ref 8.9–10.3)
Chloride: 97 mmol/L — ABNORMAL LOW (ref 98–111)
Creatinine, Ser: 0.87 mg/dL (ref 0.61–1.24)
GFR, Estimated: >60 mL/min (ref >60)
Glucose, Bld: 249 mg/dL — ABNORMAL HIGH (ref 70–99)
Potassium: 3.9 mmol/L (ref 3.5–5.1)
Sodium: 131 mmol/L — ABNORMAL LOW (ref 135–145)

## 2024-03-01 LAB — MAGNESIUM: Magnesium: 1.2 mg/dL — ABNORMAL LOW (ref 1.7–2.4)

## 2024-03-01 LAB — VAS US ABI WITH/WO TBI
Left ABI: 0.88
Right ABI: 0.55

## 2024-03-01 MED ORDER — HYDROCORTISONE ACE-PRAMOXINE 1-1 % EX CREA
TOPICAL_CREAM | Freq: Four times a day (QID) | CUTANEOUS | Status: DC
  Filled 2024-03-01: qty 30

## 2024-03-01 MED ORDER — MAGNESIUM SULFATE 4 GM/100ML IV SOLN
4.0000 g | Freq: Once | INTRAVENOUS | Status: AC
  Administered 2024-03-01: 4 g via INTRAVENOUS
  Filled 2024-03-01: qty 100

## 2024-03-01 MED ORDER — HYDROCORT-PRAMOXINE (PERIANAL) 1-1 % EX FOAM
1.0000 | Freq: Four times a day (QID) | CUTANEOUS | Status: AC
  Administered 2024-03-01 – 2024-03-02 (×3): 1 via RECTAL
  Filled 2024-03-01 (×2): qty 10

## 2024-03-01 MED ORDER — MAGNESIUM SULFATE 2 GM/50ML IV SOLN: 2.0000 g | Freq: Once | INTRAVENOUS | Status: DC

## 2024-03-01 NOTE — Progress Notes (Signed)
  Subjective:  Patient ID: [name], male    DOB: 07/25/1956,  MRN: [id number]  Chief Complaint  Patient presents with   Hyperglycemia    DOS: 02/29/2024 Procedure: 1. Amputation of left great toe through proximal phalanx head, left foot 2. Irrigation and excisional debridement of ulceration 1x1 cm to subcutaneous fat tissue level, left foot     68 y.o. male seen for post op check.  Patient seen postop day 1 status post above procedures.  He does report some electric type pains down to the left great toe amputation site.  We discussed findings from surgery as well as plans for follow-up.  He does have postop shoe in room.  Has not yet tried it on.  Review of Systems: Negative except as noted in the HPI. Denies N/V/F/Ch.   Objective:   Constitutional Well developed. Well nourished.  Vascular Foot warm and well perfused. Capillary refill normal to all digits.   No calf pain with palpation  Neurologic Normal speech. Oriented to person, place, and time. Epicritic sensation diminished to left foot  Dermatologic Dressing clean dry intact to left foot  Orthopedic: Status post left hallux amputation partial at the proximal phalanx head level and heel debridement   Radiographs: Postsurgical changes of the great toe amputation at the mid proximal phalanx.  Pathology: Pending  Micro: Rare GPC in pairs, pending  Assessment:   1. Toe necrosis (HCC)   2. Cellulitis of left lower extremity   3. Hyperglycemia   Osteomyelitis of left hallux distal phalanx status post partial left hallux amputation and heel wound debridement  Plan:  Patient was evaluated and treated and all questions answered.  POD # 1 s/p left hallux partial amputation through proximal phalanx head and left heel ulceration debridement to subcutaneous fat tissue -Progressing as expected postoperatively pain is present and improved from prior, may benefit from gabapentin  for nerve pain -XR: Expected postoperative  changes -WB Status: Weightbearing as tolerated in postop shoe -Sutures: Remain intact 2 to 3 weeks. -Medications/ABX: Recommend 7 days p.o. antibiotics on discharge likely Augmentin  and doxycycline  -Dressing: Remain clean dry and intact until follow-up next week - F/u Plan: Patient is stable for discharge from podiatry standpoint okay to go on p.o. antibiotics today versus tomorrow.  Patient will follow-up in the [facility] office Monday 7/28 with Dr. Lamount.  Office will call to arrange. leave dressing clean dry and intact until then.        Marolyn JULIANNA Honour, DPM [facility] / [facility]

## 2024-03-01 NOTE — Telephone Encounter (Signed)
 [name] with DSS inquiring about what is needed for medical necessity letter patient is requesting to have DSS be representative payee.   Typed up letter detailing necessity for representative payee through DSS. Received PCP signature and faxed to [facility] at [phone].  Darrelyn Drum, PharmD, BCPS, CPP Clinical Pharmacist Practitioner [facility] [phone]

## 2024-03-01 NOTE — Telephone Encounter (Signed)
 A Vm was left on Ext [phone] from Angela,RN (Nurse Case Manager ) she received a message from this patient stating he gets his insulin  from patient assistance.Now he has run out and needs it. She would like a call back about this patient.   Her call back number is [phone]

## 2024-03-01 NOTE — Plan of Care (Signed)
  Problem: Education: Goal: Ability to describe self-care measures that may prevent or decrease complications (Diabetes Survival Skills Education) will improve Outcome: Progressing   Problem: Coping: Goal: Ability to adjust to condition or change in health will improve Outcome: Progressing   Problem: Fluid Volume: Goal: Ability to maintain a balanced intake and output will improve Outcome: Progressing   Problem: Health Behavior/Discharge Planning: Goal: Ability to identify and utilize available resources and services will improve Outcome: Progressing Goal: Ability to manage health-related needs will improve Outcome: Progressing   Problem: Skin Integrity: Goal: Risk for impaired skin integrity will decrease Outcome: Progressing   Problem: Tissue Perfusion: Goal: Adequacy of tissue perfusion will improve Outcome: Progressing   Problem: Health Behavior/Discharge Planning: Goal: Ability to manage health-related needs will improve Outcome: Progressing   Problem: Clinical Measurements: Goal: Ability to maintain clinical measurements within normal limits will improve Outcome: Progressing   Problem: Coping: Goal: Level of anxiety will decrease Outcome: Progressing   Problem: Elimination: Goal: Will not experience complications related to bowel motility Outcome: Progressing Goal: Will not experience complications related to urinary retention Outcome: Progressing   Problem: Nutrition: Goal: Adequate nutrition will be maintained Outcome: Progressing

## 2024-03-01 NOTE — Inpatient Diabetes Management (Signed)
 Inpatient Diabetes Program Recommendations  AACE/ADA: New Consensus Statement on Inpatient Glycemic Control (2015)  Target Ranges:  Prepandial:   less than 140 mg/dL      Peak postprandial:   less than 180 mg/dL (1-2 hours)      Critically ill patients:  140 - 180 mg/dL   Lab Results  Component Value Date   GLUCAP 284 (H) 03/01/2024   HGBA1C 11.2 (H) 02/22/2024    Review of Glycemic Control  Latest Reference Range & Units 02/29/24 11:55 02/29/24 16:19 02/29/24 20:23 03/01/24 00:02 03/01/24 03:59 03/01/24 08:29 03/01/24 12:12  Glucose-Capillary 70 - 99 mg/dL 801 (H) 585 (H) 664 (H) 327 (H) 165 (H) 213 (H) 284 (H)  (H): Data is abnormally high Diabetes history: Type 2 DM Outpatient Diabetes medications: Tresiba 100 units QD Current orders for Inpatient glycemic control: Novolog  0-14 units Q2H PRN   Inpatient Diabetes Program Recommendations:     Consider adding: -Semglee  15 units every day -Novolog  4 units TID (assuming once meals intake resumes and patient consuming >50% for meals) Secure chat sent to MD.   Beatris with patient at length regarding outpatient diabetes management. Patient reports difficulty obtain insulins since last admission and left without having needed supplies.  Reviewed patient's current A1c of 11.2%. Explained what a A1c is and what it measures. Also reviewed goal A1c with patient, importance of good glucose control @ home, and blood sugar goals. Reviewed path of DM, need for improved control, basic survival skills, interventions, vascular changes and commorbidities.  Patient has a meter and testing supplies.  Is followed by Stefano Butts, last appointment 07/07/23. Patient reports that Dr Butts provided a large amount of insulin . That has since expired. He is requesting additional insulin . Cannot afford insulin . Does not have access to water or electricity at home; thus plans to keep ice on insulin .  Reached out to Tawni Drum, [facility] with [facility] who is in the process of awaiting on SHIIP through Medicare. Plans to coordinate providing patient with samples. TOC also contacted. Secure chat sent to MD.   Thanks, Tinnie Minus, MSN, RNC-OB Diabetes Coordinator [phone] (8a-5p)

## 2024-03-01 NOTE — Plan of Care (Signed)

## 2024-03-01 NOTE — Telephone Encounter (Signed)
 Spoke with Jon the case manager and she scheduled patient appointment for next week.  She was advised that patient could fill out forms for patient assistance for the medication. Insulin  was sent in on 02/22/24 by PCP. She will callback if patient is unable to make appointment

## 2024-03-01 NOTE — Progress Notes (Signed)
 PROGRESS NOTE  [name]  FMW:[id number] DOB: 1955/09/03 DOA: 02/27/2024 PCP: Norleen Lynwood LELON, MD  Consultants  Brief Narrative: 68 y.o. male with a PMH significant for significant for coronary artery disease, paroxysmal atrial fibrillation not on anticoagulation, PSVT, PAD, renal artery stenosis, type 2 diabetes mellitus on insulin  (been noncompliant), GERD, gout, hypertension, hyperlipidemia, bladder cancer status post TURP and tobacco abuse.  Patient was admitted recently with left fifth toe cellulitis/osteomyelitis leading to amputation of the left fifth toe.  Around 02/10/2024, worsening discoloration of the left foot was noted, with changes involving left great toe.  Patient was eventually found to have occluded left SFA, that was stented.  Patient underwent repeat stenting of the left SFA on 02/15/2024.  Patient is admitted with necrotic left great toe, with some cellulitic changes and swelling of the left lower leg/foot.  Patient underwent amputation of the left great toe, irrigation and excisional debridement of ulceration 1 x 1 cm subcutaneous fat tissue level, left foot on 02/29/24  Podiatry team input is appreciated.  Postop care as per podiatry team.   Assessment & Plan:  Necrotic left great toe with osteomyelitis Left lower extremity cellulitis PAD status post recent left SFA stenting on 02/15/2024 Possible left foot abscess: - Status post surgery 7/21.  Patient is doing well without any complaints. - Postop care as per podiatry team.   - Patient is on IV vancomycin  and Zosyn --> will switch to p.o. antibiotics at discharge.   Poorly controlled insulin -dependent type 2 diabetes: -Patient has been out of the boot watering and obesity he is working out of [employer] back in May.  Has not been able to refrigerate his insulin  at home.  Therefore has not been able to use his insulin  and.   - Glucose of greater than 500 on presentation.   - Here his blood sugars have been relatively  controlled--> we are working to find an outpatient solution for insulin  - Last A1c was 11.2.   Pseudohyponatremia/hyponatremia: -Sodium has improved from 127-133.   - Level of 131 today.  Secondary to elevated CBG, will follow   CAD -Stable.     -EKG without acute ischemic changes.   PAF Not on anticoagulation.  Leukocytosis:  - s/p surgery 7/21.  - on IV abx, will trend   GERD Continue Protonix .   Hypertension Currently normotensive.   Continue irbesartan .   Hyperlipidemia Continue Lipitor .   Mood disorder Continue Celexa .   Hypomagnesemia: - replaced today.       DVT prophylaxis:  SCDs Start: 02/28/24 0051  Code Status:   Code Status: Full Code Level of care: Telemetry Medical Status is: Inpatient   Consults called: Vascular surgery, podiatry  Subjective: Patient without new complaints today.  Still with some pain affected foot.  Objective: Vitals:   02/29/24 1912 03/01/24 0403 03/01/24 0830 03/01/24 1432  BP: (!) 140/68 (!) 141/63 (!) 172/72 (!) 131/48  Pulse: 70 65 80 77  Resp: 18 18 16 16   Temp: 98.6 F (37 C) 98.3 F (36.8 C) 99.6 F (37.6 C) 98.6 F (37 C)  TempSrc: Oral Oral  Oral  SpO2: 98% 98% 96% 98%  Weight:      Height:        Intake/Output Summary (Last 24 hours) at 03/01/2024 1638 Last data filed at 03/01/2024 0900 Gross per 24 hour  Intake 120 ml  Output 550 ml  Net -430 ml   Filed Weights   02/29/24 0920  Weight: 72.5 kg   Body  mass index is 24.3 kg/m.  Gen: 68 y.o. male in no apparent distress.  Nontoxic Pulm: Non-labored breathing.  Clear to auscultation bilaterally.  CV: Regular rate and rhythm.  GI: Abdomen soft, non-tender, non-distended Ext: Warm, left foot is wrapped in bandages clean/ Skin: No rashes, lesions  Neuro: Alert and oriented. No focal neurological deficits. Psych: Calm  Judgement and insight appear normal. Mood & affect appropriate.     I have personally reviewed the following labs and  images: CBC: Recent Labs  Lab 02/27/24 1726 02/27/24 1732 02/28/24 0405 02/29/24 0655 03/01/24 0906  WBC 17.5*  --  17.2* 13.7* 17.0*  NEUTROABS  --   --   --  9.9*  --   HGB 14.7 14.3 13.9 13.3 14.5  HCT 41.1 42.0 38.6* 38.0* 41.1  MCV 93.0  --  92.1 93.8 92.2  PLT 272  --  270 232 271   BMP &GFR Recent Labs  Lab 02/27/24 1726 02/27/24 1732 02/28/24 0405 02/29/24 0655 03/01/24 0906  NA 127* 128* 133* 134* 131*  K 3.9 3.9 3.6 3.6 3.9  CL 91*  --  98 98 97*  CO2 24  --  26 25 25   GLUCOSE 558*  --  76 177* 249*  BUN 10  --  9 11 13   CREATININE 0.98  --  0.83 0.74 0.87  CALCIUM  8.8*  --  8.9 8.5* 9.0  MG  --   --   --  1.4* 1.2*  PHOS  --   --   --  3.2  --    Estimated Creatinine Clearance: 79.7 mL/min (by C-G formula based on SCr of 0.87 mg/dL). Liver & Pancreas: Recent Labs  Lab 02/27/24 1726 02/29/24 0655  AST 20  --   ALT 13  --   ALKPHOS 98  --   BILITOT 1.1  --   PROT 6.9  --   ALBUMIN 2.7* 2.2*   No results for input(s): LIPASE, AMYLASE in the last 168 hours. No results for input(s): AMMONIA in the last 168 hours. Diabetic: No results for input(s): HGBA1C in the last 72 hours. Recent Labs  Lab 02/29/24 2023 03/01/24 0002 03/01/24 0359 03/01/24 0829 03/01/24 1212  GLUCAP 335* 327* 165* 213* 284*   Cardiac Enzymes: No results for input(s): CKTOTAL, CKMB, CKMBINDEX, TROPONINI in the last 168 hours. No results for input(s): PROBNP in the last 8760 hours. Coagulation Profile: No results for input(s): INR, PROTIME in the last 168 hours. Thyroid  Function Tests: No results for input(s): TSH, T4TOTAL, FREET4, T3FREE, THYROIDAB in the last 72 hours. Lipid Profile: No results for input(s): CHOL, HDL, LDLCALC, TRIG, CHOLHDL, LDLDIRECT in the last 72 hours. Anemia Panel: No results for input(s): VITAMINB12, FOLATE, FERRITIN, TIBC, IRON, RETICCTPCT in the last 72 hours. Urine analysis:     Component Value Date/Time   COLORURINE STRAW (A) 02/27/2024 1716   APPEARANCEUR CLEAR 02/27/2024 1716   LABSPEC 1.027 02/27/2024 1716   PHURINE 5.0 02/27/2024 1716   GLUCOSEU >=500 (A) 02/27/2024 1716   GLUCOSEU >=1000 (A) 02/22/2024 1454   HGBUR NEGATIVE 02/27/2024 1716   BILIRUBINUR NEGATIVE 02/27/2024 1716   KETONESUR NEGATIVE 02/27/2024 1716   PROTEINUR NEGATIVE 02/27/2024 1716   UROBILINOGEN 0.2 02/22/2024 1454   NITRITE NEGATIVE 02/27/2024 1716   LEUKOCYTESUR TRACE (A) 02/27/2024 1716   Sepsis Labs: Invalid input(s): PROCALCITONIN, LACTICIDVEN  Microbiology: Recent Results (from the past 240 hours)  Blood culture (routine x 2)     Status: None (Preliminary result)  Collection Time: 02/27/24  7:33 PM   Specimen: BLOOD RIGHT ARM  Result Value Ref Range Status   Specimen Description BLOOD RIGHT ARM  Final   Special Requests   Final    BOTTLES DRAWN AEROBIC AND ANAEROBIC Blood Culture adequate volume   Culture   Final    NO GROWTH 3 DAYS Performed at [facility], [address], [place], [place] [address]    Report Status PENDING  Incomplete  Blood culture (routine x 2)     Status: None (Preliminary result)   Collection Time: 02/27/24  7:33 PM   Specimen: BLOOD  Result Value Ref Range Status   Specimen Description BLOOD BLOOD LEFT ARM  Final   Special Requests   Final    BOTTLES DRAWN AEROBIC AND ANAEROBIC Blood Culture adequate volume   Culture   Final    NO GROWTH 3 DAYS Performed at [facility], [address], [place], [place] [address]    Report Status PENDING  Incomplete  Surgical PCR screen     Status: None   Collection Time: 02/28/24 12:26 PM   Specimen: Nasal Mucosa; Nasal Swab  Result Value Ref Range Status   MRSA, PCR NEGATIVE NEGATIVE Final   Staphylococcus aureus NEGATIVE NEGATIVE Final    Comment: (NOTE) The Xpert SA Assay (FDA approved for NASAL specimens in patients 44 years of age and older), is one component of a  comprehensive surveillance program. It is not intended to diagnose infection nor to guide or monitor treatment. Performed at [facility], [address], [place], [place] [address]   Aerobic/Anaerobic Culture w Gram Stain (surgical/deep wound)     Status: None (Preliminary result)   Collection Time: 02/29/24 10:32 AM   Specimen: Soft Tissue, Other  Result Value Ref Range Status   Specimen Description TISSUE  Final   Special Requests NONE  Final   Gram Stain NO WBC SEEN RARE GRAM POSITIVE COCCI IN PAIRS   Final   Culture   Final    CULTURE REINCUBATED FOR BETTER GROWTH Performed at [facility], [address], [place], [place] [address]    Report Status PENDING  Incomplete    Radiology Studies: VAS US  LOWER EXTREMITY ARTERIAL DUPLEX Result Date: 03/01/2024 LOWER EXTREMITY ARTERIAL DUPLEX STUDY Patient Name:  GLYN GERADS  Date of Exam:   03/01/2024 Medical Rec #: [id number]      Accession #:    [id number] Date of Birth: 10-07-1955      Patient Gender: M Patient Age:   89 years Exam Location:  [facility] Procedure:      VAS US  LOWER EXTREMITY ARTERIAL DUPLEX Referring Phys: TERETHA DAMME --------------------------------------------------------------------------------  Indications: Peripheral artery disease. High Risk Factors: Hypertension, hyperlipidemia, Diabetes, coronary artery                    disease.  Vascular Interventions: 02/29/24 - LEFT GREAT TOE AMPUTATION, 02/15/24 -LEFT 5TH                         TOE AMPUTATION.                         02/15/24 Left SFA/knee popliteal artery religning with new                         stent. Current ABI:  Right 0.55 - Left 0.88 Performing Technologist: Ricka Sturdivant-Jones RDMS, RVT  Examination Guidelines: A complete evaluation includes B-mode imaging, spectral Doppler, color Doppler, and power Doppler as needed of all accessible portions of each vessel. Bilateral testing is considered an integral part of a complete  examination. Limited examinations for reoccurring indications may be performed as noted.   +-----------+--------+-----+---------------+----------+--------+ LEFT       PSV cm/sRatioStenosis       Waveform  Comments +-----------+--------+-----+---------------+----------+--------+ CFA Mid    178          30-49% stenosisbiphasic           +-----------+--------+-----+---------------+----------+--------+ CFA Distal 196          30-49% stenosismonophasic         +-----------+--------+-----+---------------+----------+--------+ DFA        65                          biphasic           +-----------+--------+-----+---------------+----------+--------+ SFA Prox   184          30-49% stenosismonophasic         +-----------+--------+-----+---------------+----------+--------+ SFA Mid    138                         monophasic         +-----------+--------+-----+---------------+----------+--------+ POP Prox   162                         monophasic         +-----------+--------+-----+---------------+----------+--------+ ATA Prox   138                         monophasic         +-----------+--------+-----+---------------+----------+--------+ ATA Mid    89                          monophasic         +-----------+--------+-----+---------------+----------+--------+ ATA Distal 102                         monophasic         +-----------+--------+-----+---------------+----------+--------+ PTA Prox   91                          monophasic         +-----------+--------+-----+---------------+----------+--------+ PTA Distal 78                          monophasic         +-----------+--------+-----+---------------+----------+--------+ PERO Prox  66                          monophasic         +-----------+--------+-----+---------------+----------+--------+ PERO Distal59                          monophasic          +-----------+--------+-----+---------------+----------+--------+  Left Stent(s): +---------------+---++----------++ Prox to Stent  +---------------+---++----------++ Proximal Stent +---------------+---++----------++ Mid Stent      +---------------+---++----------++ Distal Stent   +---------------+---++----------++ Distal to Stent71monophasic +---------------+---++----------++    Summary: Left: 30-49% stenosis noted in the common femoral artery. 30-49%  stenosis noted in the superficial femoral artery. Patent left SFA/above-knee popliteal stent.  See table(s) above for measurements and observations. Electronically signed by Norman Serve on 03/01/2024 at 3:26:40 PM.    Final    VAS US  ABI WITH/WO TBI Result Date: 03/01/2024  LOWER EXTREMITY DOPPLER STUDY Patient Name:  VAMSI APFEL  Date of Exam:   03/01/2024 Medical Rec #: [id number]      Accession #:    [id number] Date of Birth: 1955-11-23      Patient Gender: M Patient Age:   62 years Exam Location:  [facility] Procedure:      VAS US  ABI WITH/WO TBI Referring Phys: TERETHA DAMME --------------------------------------------------------------------------------  Indications: Peripheral artery disease. High Risk Factors: Hypertension, hyperlipidemia, Diabetes.  Vascular Interventions: 02/29/24 - LEFT GREAT TOE AMPUTATION, 02/15/24 -LEFT 5TH                         TOE AMPUTATION.                         02/15/24 Left SFA/knee popliteal artery religning with new                         stent. Performing Technologist: Ricka Sturdivant-Jones RDMS, RVT  Examination Guidelines: A complete evaluation includes at minimum, Doppler waveform signals and systolic blood pressure reading at the level of bilateral brachial, anterior tibial, and posterior tibial arteries, when vessel segments are accessible. Bilateral testing is considered an integral part of a complete examination.  Photoelectric Plethysmograph (PPG) waveforms and toe systolic pressure readings are included as required and additional duplex testing as needed. Limited examinations for reoccurring indications may be performed as noted.  ABI Findings: +---------+------------------+-----+-----------------+--------+ Right    Rt Pressure (mmHg)IndexWaveform         Comment  +---------+------------------+-----+-----------------+--------+ Brachial 137                    triphasic                 +---------+------------------+-----+-----------------+--------+ PTA      81                0.55 monophasic                +---------+------------------+-----+-----------------+--------+ DP       74                0.50 monophasic                +---------+------------------+-----+-----------------+--------+ Great Toe                       severely dampened         +---------+------------------+-----+-----------------+--------+ +--------+------------------+-----+----------+-------+ Left    Lt Pressure (mmHg)IndexWaveform  Comment +--------+------------------+-----+----------+-------+ [id number]                                      +--------+------------------+-----+----------+-------+ PTA     130               0.88 biphasic          +--------+------------------+-----+----------+-------+ DP      108               0.73 monophasic        +--------+------------------+-----+----------+-------+ +-------+-----------+-----------+------------+------------+ ABI/TBIToday's ABIToday's TBIPrevious ABIPrevious  TBI +-------+-----------+-----------+------------+------------+ Right  0.55       NA                                  +-------+-----------+-----------+------------+------------+ Left   0.88       NA                                  +-------+-----------+-----------+------------+------------+  Right ABIs appear increased compared to prior study on 02/10/24. Left ABIs appear increased  compared to prior study on 02/10/24.  Summary: Right: Resting right ankle-brachial index indicates moderate right lower extremity arterial disease. Left: Resting left ankle-brachial index indicates mild left lower extremity arterial disease. *See table(s) above for measurements and observations.  Electronically signed by Norman Serve on 03/01/2024 at 3:26:30 PM.    Final     Scheduled Meds:  atorvastatin   40 mg Oral Daily   citalopram   20 mg Oral Daily   hydrocortisone -pramoxine  1 applicator Rectal QID   insulin  aspart  0-9 Units Subcutaneous Q4H   irbesartan   300 mg Oral Daily   loratadine   10 mg Oral Daily   mupirocin  ointment  1 Application Nasal BID   pantoprazole   40 mg Oral Daily   Continuous Infusions:  piperacillin -tazobactam (ZOSYN )  IV 3.375 g (03/01/24 1021)   vancomycin  1,500 mg (02/29/24 2200)     LOS: 3 days   35 minutes with more than 50% spent in reviewing records, counseling patient/family and coordinating care.  Reyes VEAR Gaw, MD [facility] [email or web address] 03/01/2024, 4:38 PM

## 2024-03-02 DIAGNOSIS — M86172 Other acute osteomyelitis, left ankle and foot: Secondary | ICD-10-CM | POA: Diagnosis not present

## 2024-03-02 DIAGNOSIS — Z95820 Peripheral vascular angioplasty status with implants and grafts: Secondary | ICD-10-CM

## 2024-03-02 LAB — BASIC METABOLIC PANEL WITH GFR
Anion gap: 12 (ref 5–15)
BUN: 13 mg/dL (ref 8–23)
CO2: 22 mmol/L (ref 22–32)
Calcium: 8.8 mg/dL — ABNORMAL LOW (ref 8.9–10.3)
Chloride: 100 mmol/L (ref 98–111)
Creatinine, Ser: 0.84 mg/dL (ref 0.61–1.24)
GFR, Estimated: >60 mL/min (ref >60)
Glucose, Bld: 196 mg/dL — ABNORMAL HIGH (ref 70–99)
Potassium: 3.9 mmol/L (ref 3.5–5.1)
Sodium: 134 mmol/L — ABNORMAL LOW (ref 135–145)

## 2024-03-02 LAB — CBC
HCT: 40.1 % (ref 39.0–52.0)
Hemoglobin: 14.3 g/dL (ref 13.0–17.0)
MCH: 33.3 pg (ref 26.0–34.0)
MCHC: 35.7 g/dL (ref 30.0–36.0)
MCV: 93.5 fL (ref 80.0–100.0)
Platelets: 264 K/uL (ref 150–400)
RBC: 4.29 MIL/uL (ref 4.22–5.81)
RDW: 12.4 % (ref 11.5–15.5)
WBC: 15.9 K/uL — ABNORMAL HIGH (ref 4.0–10.5)
nRBC: 0 % (ref 0.0–0.2)

## 2024-03-02 LAB — GLUCOSE, CAPILLARY
Glucose-Capillary: 187 mg/dL — ABNORMAL HIGH (ref 70–99)
Glucose-Capillary: 328 mg/dL — ABNORMAL HIGH (ref 70–99)
Glucose-Capillary: 270 mg/dL — ABNORMAL HIGH (ref 70–99)
Glucose-Capillary: 361 mg/dL — ABNORMAL HIGH (ref 70–99)
Glucose-Capillary: 327 mg/dL — ABNORMAL HIGH (ref 70–99)

## 2024-03-02 LAB — SURGICAL PATHOLOGY

## 2024-03-02 MED ORDER — VANCOMYCIN HCL IN DEXTROSE 1-5 GM/200ML-% IV SOLN
1000.0000 mg | Freq: Two times a day (BID) | INTRAVENOUS | Status: DC
  Administered 2024-03-02 – 2024-03-03 (×3): 1000 mg via INTRAVENOUS
  Filled 2024-03-02 (×3): qty 200

## 2024-03-02 MED ORDER — GUAIFENESIN-DM 100-10 MG/5ML PO SYRP
5.0000 mL | ORAL_SOLUTION | ORAL | Status: DC | PRN
  Administered 2024-03-04: 5 mL via ORAL
  Filled 2024-03-02: qty 10

## 2024-03-02 MED ORDER — CLOPIDOGREL BISULFATE 75 MG PO TABS
75.0000 mg | ORAL_TABLET | Freq: Every day | ORAL | Status: DC
  Administered 2024-03-02 – 2024-03-04 (×3): 75 mg via ORAL
  Filled 2024-03-02 (×3): qty 1

## 2024-03-02 MED ORDER — ASPIRIN 81 MG PO TBEC
81.0000 mg | DELAYED_RELEASE_TABLET | Freq: Every day | ORAL | Status: DC
  Administered 2024-03-02 – 2024-03-04 (×3): 81 mg via ORAL
  Filled 2024-03-02 (×3): qty 1

## 2024-03-02 NOTE — Plan of Care (Signed)
  Problem: Education: Goal: Ability to describe self-care measures that may prevent or decrease complications (Diabetes Survival Skills Education) will improve Outcome: Progressing   Problem: Coping: Goal: Ability to adjust to condition or change in health will improve Outcome: Progressing   Problem: Skin Integrity: Goal: Risk for impaired skin integrity will decrease Outcome: Progressing

## 2024-03-02 NOTE — Progress Notes (Signed)
 Pharmacy Antibiotic Note  [name] is a 68 y.o. male admitted on 02/27/2024 with necrotic L great toe with osteomyelitis.SABRA  Pharmacy has been consulted for Vancomycin  and Zosyn  dosing.  Plan: Zosyn  3.375gm IV q8h Vancomycin  1000mg  IV q12hr  Goal AUC 400-550. Expected AUC 525. Scr 0.84, Vd 0.72 Will f/u renal function, micro data, and pt's clinical condition Vanc levels prn   Height: 5' 8 (172.7 cm) Weight: 72.5 kg (159 lb 13.3 oz) IBW/kg (Calculated) : 68.4  Temp (24hrs), Avg:98.4 F (36.9 C), Min:97.8 F (36.6 C), Max:98.8 F (37.1 C)  Recent Labs  Lab 02/27/24 1726 02/28/24 0405 02/29/24 0655 03/01/24 0906 03/02/24 0526  WBC 17.5* 17.2* 13.7* 17.0* 15.9*  CREATININE 0.98 0.83 0.74 0.87 0.84    Estimated Creatinine Clearance: 82.6 mL/min (by C-G formula based on SCr of 0.84 mg/dL).    Allergies  Allergen Reactions   Cefepime  Hives    Had allergic reaction to one of these 3 agents, unclear which one   Metoprolol  Hives    Had allergic reaction to one of these 3 agents, unclear which one  *Per RN, highly likely this is the cause of allergic rxn   Myrbetriq  [Mirabegron ] Hives    Had allergic reaction to one of these 3 agents, unclear which one    Antimicrobials this admission: 7/19 Vanc >>  7/19 Zosyn  >>   Microbiology results: 7/19  BCx: NGTD x4 days   Morgann Woodburn, PharmD PGY-1 Pharmacy Resident [facility]  03/02/2024 9:57 AM

## 2024-03-02 NOTE — Plan of Care (Signed)
  Problem: Education: Goal: Ability to describe self-care measures that may prevent or decrease complications (Diabetes Survival Skills Education) will improve Outcome: Progressing   Problem: Coping: Goal: Ability to adjust to condition or change in health will improve Outcome: Progressing   Problem: Fluid Volume: Goal: Ability to maintain a balanced intake and output will improve Outcome: Progressing   Problem: Health Behavior/Discharge Planning: Goal: Ability to identify and utilize available resources and services will improve Outcome: Progressing Goal: Ability to manage health-related needs will improve Outcome: Progressing   Problem: Metabolic: Goal: Ability to maintain appropriate glucose levels will improve Outcome: Progressing   Problem: Nutritional: Goal: Maintenance of adequate nutrition will improve Outcome: Progressing Goal: Progress toward achieving an optimal weight will improve Outcome: Progressing   Problem: Skin Integrity: Goal: Risk for impaired skin integrity will decrease Outcome: Progressing   Problem: Tissue Perfusion: Goal: Adequacy of tissue perfusion will improve Outcome: Progressing   Problem: Education: Goal: Knowledge of General Education information will improve Description: Including pain rating scale, medication(s)/side effects and non-pharmacologic comfort measures Outcome: Progressing   Problem: Health Behavior/Discharge Planning: Goal: Ability to manage health-related needs will improve Outcome: Progressing   Problem: Clinical Measurements: Goal: Ability to maintain clinical measurements within normal limits will improve Outcome: Progressing Goal: Will remain free from infection Outcome: Progressing Goal: Diagnostic test results will improve Outcome: Progressing Goal: Respiratory complications will improve Outcome: Progressing Goal: Cardiovascular complication will be avoided Outcome: Progressing   Problem: Activity: Goal:  Risk for activity intolerance will decrease Outcome: Progressing   Problem: Nutrition: Goal: Adequate nutrition will be maintained Outcome: Progressing   Problem: Coping: Goal: Level of anxiety will decrease Outcome: Progressing   Problem: Elimination: Goal: Will not experience complications related to bowel motility Outcome: Progressing Goal: Will not experience complications related to urinary retention Outcome: Progressing   Problem: Pain Managment: Goal: General experience of comfort will improve and/or be controlled Outcome: Progressing

## 2024-03-02 NOTE — Inpatient Diabetes Management (Addendum)
 Inpatient Diabetes Program Recommendations  AACE/ADA: New Consensus Statement on Inpatient Glycemic Control (2015)  Target Ranges:  Prepandial:   less than 140 mg/dL      Peak postprandial:   less than 180 mg/dL (1-2 hours)      Critically ill patients:  140 - 180 mg/dL   Lab Results  Component Value Date   GLUCAP 270 (H) 03/02/2024   HGBA1C 11.2 (H) 02/22/2024    Review of Glycemic Control  Diabetes history: Type 2 DM Outpatient Diabetes medications: Toujeo  100 units QD Current orders for Inpatient glycemic control: Novolog  00-9 units Q4H   Inpatient Diabetes Program Recommendations:     Consider adding: -Semglee  15 units every day -Novolog  0-9 units TID -Novolog  4 units TID (assuming once meals intake resumes and patient consuming >50% for meals)  Will recommend basal/bolus at discharge.    Thank you, Wyvonna Pinal, MSN, CDCES Diabetes Coordinator Inpatient Diabetes Program [phone] (team pager from 8a-5p)

## 2024-03-02 NOTE — Progress Notes (Addendum)
  Subjective:  Patient ID: [name], male    DOB: [birth year],  MRN: [id number]  Chief Complaint  Patient presents with   Hyperglycemia    DOS: 02/29/2024 Procedure: 1. Amputation of left great toe through proximal phalanx head, left foot 2. Irrigation and excisional debridement of ulceration 1x1 cm to subcutaneous fat tissue level, left foot     68 y.o. male seen for post op check.  Patient seen postop day 1 status post above procedures.  He states pain is decreased from yesterday. We discussed follow up plans and that he should keep this dressing c/d/I until follow up.   Review of Systems: Negative except as noted in the HPI. Denies N/V/F/Ch.   Objective:   Constitutional Well developed. Well nourished.  Vascular Foot warm and well perfused. Capillary refill normal to all digits.   No calf pain with palpation  Neurologic Normal speech. Oriented to person, place, and time. Epicritic sensation diminished to left foot  Dermatologic Left hallux amputation site well coapted no drainage or dehiscence no evidence residual infection, heel wounds look healthy and improved from prior without infection/erythema      Orthopedic: Status post left hallux amputation partial at the proximal phalanx head level and heel debridement   Radiographs: Postsurgical changes of the great toe amputation at the mid proximal phalanx.  Pathology: A. LEFT GREAT TOE, AMPUTATION:  Gangrenous and coagulative necrosis with marked acute cellulitis  extending to bone showing acute osteomyelitis  Proximal bone with articular cartilage negative for acute inflammation  and osteomyelitis   Micro: Rare GPC in pairs, pending  Assessment:   1. Toe necrosis (HCC)   2. Cellulitis of left lower extremity   3. 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  POD # 2 s/p left hallux partial amputation through proximal phalanx head and left heel ulceration debridement to subcutaneous fat tissue -Progressing as expected postoperatively pain is present and improved from prior, amputation site healing without evidence of residual infection or necrosis at this time. -XR: Expected postoperative changes -WB Status: Weightbearing as tolerated in postop shoe -Sutures: Remain intact 2 to 3 weeks. -Medications/ABX: Recommend 7 days p.o. antibiotics on discharge likely Augmentin  and doxycycline  -Dressing: Dressing changed today, new xeroform dressing applied. Remain clean dry and intact until follow-up next week - F/u Plan:  Patient is stable for discharge from podiatry standpoint okay to go on p.o. antibiotics today versus tomorrow.  Patient will follow-up with me next week Tuesday.     Office will call to arrange. leave dressing clean dry and intact until then. Will sign off.        Marolyn JULIANNA Honour, DPM [facility] / [facility]

## 2024-03-02 NOTE — Progress Notes (Addendum)
  Progress Note    03/02/2024 8:25 AM 2 Days Post-Op  Subjective:  no complaints   Vitals:   03/02/24 0420 03/02/24 0758  BP: (!) 143/60 (!) 159/58  Pulse: 69 72  Resp: 18 17  Temp: 98.5 F (36.9 C) 97.8 F (36.6 C)  SpO2: 99% 93%   Physical Exam: Lungs:  non labored Extremities:  palpable L ATA; dressing left in place L foot Neurologic: A&O  CBC    Component Value Date/Time   WBC 15.9 (H) 03/02/2024 0526   RBC 4.29 03/02/2024 0526   HGB 14.3 03/02/2024 0526   HCT 40.1 03/02/2024 0526   PLT 264 03/02/2024 0526   MCV 93.5 03/02/2024 0526   MCH 33.3 03/02/2024 0526   MCHC 35.7 03/02/2024 0526   RDW 12.4 03/02/2024 0526   LYMPHSABS 2.1 02/29/2024 0655   MONOABS 1.1 (H) 02/29/2024 0655   EOSABS 0.4 02/29/2024 0655   BASOSABS 0.1 02/29/2024 0655    BMET    Component Value Date/Time   NA 134 (L) 03/02/2024 0526   NA 138 06/16/2019 1144   K 3.9 03/02/2024 0526   CL 100 03/02/2024 0526   CO2 22 03/02/2024 0526   GLUCOSE 196 (H) 03/02/2024 0526   BUN 13 03/02/2024 0526   BUN 15 06/16/2019 1144   CREATININE 0.84 03/02/2024 0526   CALCIUM  8.8 (L) 03/02/2024 0526   GFRNONAA >60 03/02/2024 0526   GFRAA >60 02/29/2020 0937    INR    Component Value Date/Time   INR 1.1 06/04/2019 2318     Intake/Output Summary (Last 24 hours) at 03/02/2024 0825 Last data filed at 03/02/2024 0300 Gross per 24 hour  Intake 120 ml  Output 550 ml  Net -430 ml     Assessment/Plan:  68 y.o. male is s/p L SFA/pop stenting 2 Days Post-Op   L foot well perfused with palpable ATA pulse.  He is optimized from a vascular standpoint.  Duplex demonstrates widely patent SFA/pop stents.  Continue aspirin , plavix , statin daily.  Wound care per Podiatry.  Office will arrange repeat imaging studies in a few months   Donnice Sender, PA-C [facility] [phone] 03/02/2024 8:25 AM   No significant stenosis appreciated on duplex studies.  Widely patent stents.  No  role for intervention at this time.  Will repeat imaging in 3 months.  Fonda FORBES Rim MD

## 2024-03-02 NOTE — Telephone Encounter (Signed)
 Currently being handled by case manager and endocrinologist

## 2024-03-02 NOTE — Progress Notes (Signed)
 PROGRESS NOTE  [name]  FMW:[id number] DOB: January 12, 1956 DOA: 02/27/2024 PCP: Norleen Lynwood LELON, MD  Consultants  Brief Narrative: 68 y.o. male with a PMH significant for significant for coronary artery disease, paroxysmal atrial fibrillation not on anticoagulation, PSVT, PAD, renal artery stenosis, type 2 diabetes mellitus on insulin  (been noncompliant), GERD, gout, hypertension, hyperlipidemia, bladder cancer status post TURP and tobacco abuse.  Patient was admitted recently with left fifth toe cellulitis/osteomyelitis leading to amputation of the left fifth toe.  Around 02/10/2024, worsening discoloration of the left foot was noted, with changes involving left great toe.  Patient was eventually found to have occluded left SFA, that was stented.  Patient underwent repeat stenting of the left SFA on 02/15/2024.  Patient is admitted with necrotic left great toe, with some cellulitic changes and swelling of the left lower leg/foot.  Patient underwent amputation of the left great toe, irrigation and excisional debridement of ulceration 1 x 1 cm subcutaneous fat tissue level, left foot on 02/29/24  Podiatry team input is appreciated.  Postop care as per podiatry team.   Assessment & Plan:  Necrotic left great toe with osteomyelitis Left lower extremity cellulitis PAD status post recent left SFA stenting on 02/15/2024 Possible left foot abscess: - Status post surgery 7/21.  Patient is doing well without any complaints. - Postop care as per podiatry team, greatly appreciate input. - Patient is on IV vancomycin  and Zosyn --> will switch to p.o. antibiotics at discharge. -Awaiting PT/OT input   Poorly controlled insulin -dependent type 2 diabetes: -Patient has been out of both water and electricity at his house as these were cut off by utility companies back in May.  Has not been able to refrigerate his insulin  at home.  Therefore has not been able to use his insulin  and thus this admission. - Glucose of greater  than 500 on presentation.   - Here his blood sugars have been relatively controlled--> we are working to find an outpatient solution for insulin , evidently he cannot afford insurance co-pay and he has been getting samples from endocrinology. - Last A1c was 11.2.   Pseudohyponatremia/hyponatremia: -Sodium has improved from 127-133.   - Level of 131 today.  Secondary to elevated CBG, will follow -He has longstanding issues with hyponatremia back to at least 2022   CAD -Stable.     -EKG without acute ischemic changes.   PAF Not on anticoagulation.  Leukocytosis:  - on IV abx, will trend -Longstanding issue since at least 2018.   GERD Continue Protonix .   Hypertension Currently normotensive.   Continue irbesartan .   Hyperlipidemia Continue Lipitor .   Mood disorder Continue Celexa .   Hypomagnesemia: - replaced today.  DVT prophylaxis:  SCDs Start: 02/28/24 0051  Code Status:   Code Status: Full Code Level of care: Telemetry Medical Status is: Inpatient Dispo: We are awaiting PT/OT input  Consults called: Vascular surgery, podiatry  Subjective: Patient without new complaints today.  Pain better today than it has been.  Objective: Vitals:   03/01/24 2032 03/02/24 0420 03/02/24 0758 03/02/24 1500  BP: (!) 146/67 (!) 143/60 (!) 159/58 132/68  Pulse: 73 69 72   Resp: 16 18 17 15   Temp: 98.8 F (37.1 C) 98.5 F (36.9 C) 97.8 F (36.6 C) 97.8 F (36.6 C)  TempSrc: Oral Oral Oral Oral  SpO2: 96% 99% 93% 99%  Weight:      Height:        Intake/Output Summary (Last 24 hours) at 03/02/2024 1546 Last data filed  at 03/02/2024 1500 Gross per 24 hour  Intake 720 ml  Output 1550 ml  Net -830 ml   Filed Weights   02/29/24 0920  Weight: 72.5 kg   Body mass index is 24.3 kg/m.  Gen: 68 y.o. male in no apparent distress.  Nontoxic Pulm: Non-labored breathing.  Clear to auscultation bilaterally.  CV: Regular rate and rhythm.  GI: Abdomen soft, non-tender,  non-distended Ext: Warm, left foot is wrapped in bandages clean/ Skin: No rashes, lesions  Neuro: Alert and oriented. No focal neurological deficits. Psych: Calm  Judgement and insight appear normal. Mood & affect appropriate.     I have personally reviewed the following labs and images: CBC: Recent Labs  Lab 02/27/24 1726 02/27/24 1732 02/28/24 0405 02/29/24 0655 03/01/24 0906 03/02/24 0526  WBC 17.5*  --  17.2* 13.7* 17.0* 15.9*  NEUTROABS  --   --   --  9.9*  --   --   HGB 14.7 14.3 13.9 13.3 14.5 14.3  HCT 41.1 42.0 38.6* 38.0* 41.1 40.1  MCV 93.0  --  92.1 93.8 92.2 93.5  PLT 272  --  270 232 271 264   BMP &GFR Recent Labs  Lab 02/27/24 1726 02/27/24 1732 02/28/24 0405 02/29/24 0655 03/01/24 0906 03/02/24 0526  NA 127* 128* 133* 134* 131* 134*  K 3.9 3.9 3.6 3.6 3.9 3.9  CL 91*  --  98 98 97* 100  CO2 24  --  26 25 25 22   GLUCOSE 558*  --  76 177* 249* 196*  BUN 10  --  9 11 13 13   CREATININE 0.98  --  0.83 0.74 0.87 0.84  CALCIUM  8.8*  --  8.9 8.5* 9.0 8.8*  MG  --   --   --  1.4* 1.2*  --   PHOS  --   --   --  3.2  --   --    Estimated Creatinine Clearance: 82.6 mL/min (by C-G formula based on SCr of 0.84 mg/dL). Liver & Pancreas: Recent Labs  Lab 02/27/24 1726 02/29/24 0655  AST 20  --   ALT 13  --   ALKPHOS 98  --   BILITOT 1.1  --   PROT 6.9  --   ALBUMIN 2.7* 2.2*   No results for input(s): LIPASE, AMYLASE in the last 168 hours. No results for input(s): AMMONIA in the last 168 hours. Diabetic: No results for input(s): HGBA1C in the last 72 hours. Recent Labs  Lab 03/01/24 2152 03/01/24 2344 03/02/24 0420 03/02/24 0839 03/02/24 1138  GLUCAP 352* 115* 187* 328* 270*   Cardiac Enzymes: No results for input(s): CKTOTAL, CKMB, CKMBINDEX, TROPONINI in the last 168 hours. No results for input(s): PROBNP in the last 8760 hours. Coagulation Profile: No results for input(s): INR, PROTIME in the last 168 hours. Thyroid   Function Tests: No results for input(s): TSH, T4TOTAL, FREET4, T3FREE, THYROIDAB in the last 72 hours. Lipid Profile: No results for input(s): CHOL, HDL, LDLCALC, TRIG, CHOLHDL, LDLDIRECT in the last 72 hours. Anemia Panel: No results for input(s): VITAMINB12, FOLATE, FERRITIN, TIBC, IRON, RETICCTPCT in the last 72 hours. Urine analysis:    Component Value Date/Time   COLORURINE STRAW (A) 02/27/2024 1716   APPEARANCEUR CLEAR 02/27/2024 1716   LABSPEC 1.027 02/27/2024 1716   PHURINE 5.0 02/27/2024 1716   GLUCOSEU >=500 (A) 02/27/2024 1716   GLUCOSEU >=1000 (A) 02/22/2024 1454   HGBUR NEGATIVE 02/27/2024 1716   BILIRUBINUR NEGATIVE 02/27/2024 1716   KETONESUR NEGATIVE  02/27/2024 1716   PROTEINUR NEGATIVE 02/27/2024 1716   UROBILINOGEN 0.2 02/22/2024 1454   NITRITE NEGATIVE 02/27/2024 1716   LEUKOCYTESUR TRACE (A) 02/27/2024 1716   Sepsis Labs: Invalid input(s): PROCALCITONIN, LACTICIDVEN  Microbiology: Recent Results (from the past 240 hours)  Blood culture (routine x 2)     Status: None (Preliminary result)   Collection Time: 02/27/24  7:33 PM   Specimen: BLOOD RIGHT ARM  Result Value Ref Range Status   Specimen Description BLOOD RIGHT ARM  Final   Special Requests   Final    BOTTLES DRAWN AEROBIC AND ANAEROBIC Blood Culture adequate volume   Culture   Final    NO GROWTH 4 DAYS Performed at [facility], [address], [place], [place] [address]    Report Status PENDING  Incomplete  Blood culture (routine x 2)     Status: None (Preliminary result)   Collection Time: 02/27/24  7:33 PM   Specimen: BLOOD  Result Value Ref Range Status   Specimen Description BLOOD BLOOD LEFT ARM  Final   Special Requests   Final    BOTTLES DRAWN AEROBIC AND ANAEROBIC Blood Culture adequate volume   Culture   Final    NO GROWTH 4 DAYS Performed at [facility], [address] [place], [place] [address]    Report Status PENDING   Incomplete  Surgical PCR screen     Status: None   Collection Time: 02/28/24 12:26 PM   Specimen: Nasal Mucosa; Nasal Swab  Result Value Ref Range Status   MRSA, PCR NEGATIVE NEGATIVE Final   Staphylococcus aureus NEGATIVE NEGATIVE Final    Comment: (NOTE) The Xpert SA Assay (FDA approved for NASAL specimens in patients 77 years of age and older), is one component of a comprehensive surveillance program. It is not intended to diagnose infection nor to guide or monitor treatment. Performed at [facility], [address] [place], [place] [address]   Aerobic/Anaerobic Culture w Gram Stain (surgical/deep wound)     Status: None (Preliminary result)   Collection Time: 02/29/24 10:32 AM   Specimen: Soft Tissue, Other  Result Value Ref Range Status   Specimen Description TISSUE  Final   Special Requests NONE  Final   Gram Stain   Final    NO WBC SEEN RARE GRAM POSITIVE COCCI IN PAIRS Performed at [facility], [address], [place], [place] [address]    Culture   Final    FEW ENTEROCOCCUS FAECALIS SUSCEPTIBILITIES TO FOLLOW NO ANAEROBES ISOLATED; CULTURE IN PROGRESS FOR 5 DAYS    Report Status PENDING  Incomplete    Radiology Studies: No results found.   Scheduled Meds:  aspirin  EC  81 mg Oral Daily   atorvastatin   40 mg Oral Daily   citalopram   20 mg Oral Daily   clopidogrel   75 mg Oral Daily   hydrocortisone -pramoxine  1 applicator Rectal QID   insulin  aspart  0-9 Units Subcutaneous Q4H   irbesartan   300 mg Oral Daily   loratadine   10 mg Oral Daily   mupirocin  ointment  1 Application Nasal BID   pantoprazole   40 mg Oral Daily   Continuous Infusions:  piperacillin -tazobactam (ZOSYN )  IV 3.375 g (03/02/24 0853)   vancomycin  1,000 mg (03/02/24 1344)     LOS: 4 days   35 minutes with more than 50% spent in reviewing records, counseling patient/family and coordinating care.  Reyes VEAR Gaw, MD [facility] [email or web address] 03/02/2024, 3:46  PM

## 2024-03-03 DIAGNOSIS — M86172 Other acute osteomyelitis, left ankle and foot: Secondary | ICD-10-CM | POA: Diagnosis not present

## 2024-03-03 LAB — GLUCOSE, CAPILLARY
Glucose-Capillary: 128 mg/dL — ABNORMAL HIGH (ref 70–99)
Glucose-Capillary: 237 mg/dL — ABNORMAL HIGH (ref 70–99)
Glucose-Capillary: 260 mg/dL — ABNORMAL HIGH (ref 70–99)
Glucose-Capillary: 262 mg/dL — ABNORMAL HIGH (ref 70–99)
Glucose-Capillary: 262 mg/dL — ABNORMAL HIGH (ref 70–99)
Glucose-Capillary: 339 mg/dL — ABNORMAL HIGH (ref 70–99)

## 2024-03-03 LAB — BASIC METABOLIC PANEL WITH GFR
Anion gap: 10 (ref 5–15)
BUN: 8 mg/dL (ref 8–23)
CO2: 30 mmol/L (ref 22–32)
Calcium: 9.1 mg/dL (ref 8.9–10.3)
Chloride: 97 mmol/L — ABNORMAL LOW (ref 98–111)
Creatinine, Ser: 0.86 mg/dL (ref 0.61–1.24)
GFR, Estimated: >60 mL/min (ref >60)
Glucose, Bld: 139 mg/dL — ABNORMAL HIGH (ref 70–99)
Potassium: 4.1 mmol/L (ref 3.5–5.1)
Sodium: 137 mmol/L (ref 135–145)

## 2024-03-03 LAB — CULTURE, BLOOD (ROUTINE X 2)
Culture: NO GROWTH
Culture: NO GROWTH
Special Requests: ADEQUATE
Special Requests: ADEQUATE

## 2024-03-03 LAB — GASTROINTESTINAL PANEL BY PCR, STOOL (REPLACES STOOL CULTURE)

## 2024-03-03 LAB — CBC
HCT: 39.4 % (ref 39.0–52.0)
Hemoglobin: 13.7 g/dL (ref 13.0–17.0)
MCH: 32.6 pg (ref 26.0–34.0)
MCHC: 34.8 g/dL (ref 30.0–36.0)
MCV: 93.8 fL (ref 80.0–100.0)
Platelets: 270 K/uL (ref 150–400)
RBC: 4.2 MIL/uL — ABNORMAL LOW (ref 4.22–5.81)
RDW: 12.3 % (ref 11.5–15.5)
WBC: 11.7 K/uL — ABNORMAL HIGH (ref 4.0–10.5)
nRBC: 0 % (ref 0.0–0.2)

## 2024-03-03 LAB — MAGNESIUM: Magnesium: 1.4 mg/dL — ABNORMAL LOW (ref 1.7–2.4)

## 2024-03-03 MED ORDER — INSULIN ASPART 100 UNIT/ML IJ SOLN
0.0000 [IU] | Freq: Three times a day (TID) | INTRAMUSCULAR | Status: DC
  Administered 2024-03-03: 8 [IU] via SUBCUTANEOUS
  Administered 2024-03-03: 11 [IU] via SUBCUTANEOUS
  Administered 2024-03-03: 15 [IU] via SUBCUTANEOUS
  Administered 2024-03-04 (×2): 8 [IU] via SUBCUTANEOUS

## 2024-03-03 MED ORDER — INSULIN ASPART 100 UNIT/ML IJ SOLN
3.0000 [IU] | Freq: Three times a day (TID) | INTRAMUSCULAR | Status: DC
  Administered 2024-03-03 – 2024-03-04 (×4): 3 [IU] via SUBCUTANEOUS

## 2024-03-03 MED ORDER — ENOXAPARIN SODIUM 40 MG/0.4ML IJ SOSY
40.0000 mg | PREFILLED_SYRINGE | Freq: Every day | INTRAMUSCULAR | Status: DC
  Administered 2024-03-03 – 2024-03-04 (×2): 40 mg via SUBCUTANEOUS
  Filled 2024-03-03 (×2): qty 0.4

## 2024-03-03 MED ORDER — INSULIN GLARGINE-YFGN 100 UNIT/ML ~~LOC~~ SOLN
15.0000 [IU] | Freq: Every day | SUBCUTANEOUS | Status: DC
  Administered 2024-03-03: 15 [IU] via SUBCUTANEOUS
  Filled 2024-03-03 (×2): qty 0.15

## 2024-03-03 MED ORDER — INSULIN ASPART 100 UNIT/ML IJ SOLN
0.0000 [IU] | Freq: Every day | INTRAMUSCULAR | Status: DC
  Administered 2024-03-03: 3 [IU] via SUBCUTANEOUS

## 2024-03-03 MED ORDER — DOXYCYCLINE HYCLATE 100 MG PO TABS
100.0000 mg | ORAL_TABLET | Freq: Two times a day (BID) | ORAL | Status: DC
  Administered 2024-03-03 – 2024-03-04 (×3): 100 mg via ORAL
  Filled 2024-03-03 (×3): qty 1

## 2024-03-03 MED ORDER — AMOXICILLIN-POT CLAVULANATE 875-125 MG PO TABS
1.0000 | ORAL_TABLET | Freq: Two times a day (BID) | ORAL | Status: DC
  Administered 2024-03-03 – 2024-03-04 (×2): 1 via ORAL
  Filled 2024-03-03 (×2): qty 1

## 2024-03-03 MED ORDER — MAGNESIUM SULFATE 4 GM/100ML IV SOLN
4.0000 g | Freq: Once | INTRAVENOUS | Status: AC
  Administered 2024-03-03: 4 g via INTRAVENOUS
  Filled 2024-03-03: qty 100

## 2024-03-03 NOTE — Progress Notes (Addendum)
 PROGRESS NOTE    [name]  FMW:[id number] DOB: September 23, 1955 DOA: 02/27/2024 PCP: Norleen Lynwood LELON, MD   Brief Narrative:  68 y.o. male with a PMH significant for significant for coronary artery disease, paroxysmal atrial fibrillation not on anticoagulation, PSVT, PAD, renal artery stenosis, type 2 diabetes mellitus on insulin  (been noncompliant), GERD, gout, hypertension, hyperlipidemia, bladder cancer status post TURP and tobacco abuse.  Patient was admitted recently with left fifth toe cellulitis/osteomyelitis leading to amputation of the left fifth toe.  Around 02/10/2024, worsening discoloration of the left foot was noted, with changes involving left great toe.  Patient was eventually found to have occluded left SFA, that was stented.  Patient underwent repeat stenting of the left SFA on 02/15/2024.  Patient is admitted with necrotic left great toe, with some cellulitic changes and swelling of the left lower leg/foot.  Patient underwent amputation of the left great toe, irrigation and excisional debridement of ulceration 1 x 1 cm subcutaneous fat tissue level, left foot on 02/29/24  Podiatry team input is appreciated.  Postop care as per podiatry team.   Assessment & Plan:   Principal Problem:   Osteomyelitis (HCC) Active Problems:   Essential hypertension   PAD (peripheral artery disease) (HCC)   Diabetes (HCC)   Cellulitis in diabetic foot (HCC)   Skin ulcer of left heel with fat layer exposed (HCC)  Necrotic left great toe with osteomyelitis/ Left lower extremity cellulitis PAD status post recent left SFA stenting on 02/15/2024/ Possible left foot abscess: - Status post Amputation of left great toe through proximal phalanx head, left foot 7/21.  Cleared by podiatry for discharge.  On Zosyn .  Prelim wound culture growing E faecalis, will transition to Augmentin  per pharmacy recommendation.    Poorly controlled insulin -dependent type 2 diabetes: -Patient has been out of both water and  electricity at his house as these were cut off by utility companies back in May.  Has not been able to refrigerate his insulin  at home.  Therefore has not been able to use his insulin  and thus this admission. - Glucose of greater than 500 on presentation.  we are working to find an outpatient solution for insulin , evidently he cannot afford insurance co-pay and he has been getting samples from endocrinology. - Last A1c was 11.2.  Currently only on SSI and that to every 4 hours.  Patient already on diet.  Will transition him to SSI and since he is still hyperglycemic and per diabetes coordinator recommendation, will start him on Lantus  15 units as well as NovoLog  3 times daily Premeal 3 units.   Pseudohyponatremia/hyponatremia: -Sodium has improved from 127-137.   -He has longstanding issues with hyponatremia back to at least 2022   CAD -Stable.     -EKG without acute ischemic changes.  Continue aspirin  and Plavix .   PAF Not on anticoagulation.  On aspirin  and Plavix .   GERD Continue Protonix .   Hypertension Currently normotensive.   Continue irbesartan .   Hyperlipidemia Continue Lipitor .   Mood disorder Continue Celexa .   Hypomagnesemia: - Low again, will replenish.  DVT prophylaxis: SCDs Start: 02/28/24 0051 will start on Lovenox    Code Status: Full Code  Family Communication:  None present at bedside.  Plan of care discussed with patient in length and he/she verbalized understanding and agreed with it.  Status is: Inpatient Remains inpatient appropriate because: Medically stable, TOC need to help with living arrangements,   Estimated body mass index is 24.3 kg/m as calculated from the following:  Height as of this encounter: 5' 8 (1.727 m).   Weight as of this encounter: 72.5 kg.    Nutritional Assessment: Body mass index is 24.3 kg/m.SABRA Seen by dietician.  I agree with the assessment and plan as outlined below: Nutrition Status:        . Skin Assessment: I  have examined the patient's skin and I agree with the wound assessment as performed by the wound care RN as outlined below:    Consultants:  Podiatry-signed off  Procedures:  As above  Antimicrobials:  Anti-infectives (From admission, onward)    Start     Dose/Rate Route Frequency Ordered Stop   03/02/24 1030  vancomycin  (VANCOCIN ) IVPB 1000 mg/200 mL premix        1,000 mg 200 mL/hr over 60 Minutes Intravenous Every 12 hours 03/02/24 1001     02/28/24 2000  vancomycin  (VANCOREADY) IVPB 1500 mg/300 mL  Status:  Discontinued        1,500 mg 150 mL/hr over 120 Minutes Intravenous Every 24 hours 02/28/24 0111 03/02/24 1001   02/28/24 0200  piperacillin -tazobactam (ZOSYN ) IVPB 3.375 g        3.375 g 12.5 mL/hr over 240 Minutes Intravenous Every 8 hours 02/28/24 0111     02/27/24 1915  piperacillin -tazobactam (ZOSYN ) IVPB 3.375 g        3.375 g 100 mL/hr over 30 Minutes Intravenous  Once 02/27/24 1911 02/27/24 2023   02/27/24 1915  vancomycin  (VANCOREADY) IVPB 1500 mg/300 mL        1,500 mg 150 mL/hr over 120 Minutes Intravenous  Once 02/27/24 1911 02/27/24 2219         Subjective: Patient seen and examined, he has no complaints at all.  He is also clueless as to what arrangements are being done for him to go home.  Objective: Vitals:   03/02/24 0758 03/02/24 1500 03/02/24 2021 03/03/24 0437  BP: (!) 159/58 132/68 (!) 151/69 111/71  Pulse: 72  70 67  Resp: 17 15 16 17   Temp: 97.8 F (36.6 C) 97.8 F (36.6 C) 97.6 F (36.4 C) 97.9 F (36.6 C)  TempSrc: Oral Oral Oral Oral  SpO2: 93% 99% 100% 96%  Weight:      Height:        Intake/Output Summary (Last 24 hours) at 03/03/2024 0753 Last data filed at 03/03/2024 0500 Gross per 24 hour  Intake 1200 ml  Output 2900 ml  Net -1700 ml   Filed Weights   02/29/24 0920  Weight: 72.5 kg    Examination:  General exam: Appears calm and comfortable  Respiratory system: Clear to auscultation. Respiratory effort  normal. Cardiovascular system: S1 & S2 heard, RRR. No JVD, murmurs, rubs, gallops or clicks. No pedal edema. Gastrointestinal system: Abdomen is nondistended, soft and nontender. No organomegaly or masses felt. Normal bowel sounds heard. Central nervous system: Alert and oriented. No focal neurological deficits. Extremities: Dressing left foot. Psychiatry: Judgement and insight appear normal. Mood & affect appropriate.    Data Reviewed: I have personally reviewed following labs and imaging studies  CBC: Recent Labs  Lab 02/28/24 0405 02/29/24 0655 03/01/24 0906 03/02/24 0526 03/03/24 0601  WBC 17.2* 13.7* 17.0* 15.9* 11.7*  NEUTROABS  --  9.9*  --   --   --   HGB 13.9 13.3 14.5 14.3 13.7  HCT 38.6* 38.0* 41.1 40.1 39.4  MCV 92.1 93.8 92.2 93.5 93.8  PLT 270 232 271 264 270   Basic Metabolic Panel: Recent Labs  Lab 02/28/24 0405 02/29/24 0655 03/01/24 0906 03/02/24 0526 03/03/24 0601  NA 133* 134* 131* 134* 137  K 3.6 3.6 3.9 3.9 4.1  CL 98 98 97* 100 97*  CO2 26 25 25 22 30   GLUCOSE 76 177* 249* 196* 139*  BUN 9 11 13 13 8   CREATININE 0.83 0.74 0.87 0.84 0.86  CALCIUM  8.9 8.5* 9.0 8.8* 9.1  MG  --  1.4* 1.2*  --   --   PHOS  --  3.2  --   --   --    GFR: Estimated Creatinine Clearance: 80.6 mL/min (by C-G formula based on SCr of 0.86 mg/dL). Liver Function Tests: Recent Labs  Lab 02/27/24 1726 02/29/24 0655  AST 20  --   ALT 13  --   ALKPHOS 98  --   BILITOT 1.1  --   PROT 6.9  --   ALBUMIN 2.7* 2.2*   No results for input(s): LIPASE, AMYLASE in the last 168 hours. No results for input(s): AMMONIA in the last 168 hours. Coagulation Profile: No results for input(s): INR, PROTIME in the last 168 hours. Cardiac Enzymes: No results for input(s): CKTOTAL, CKMB, CKMBINDEX, TROPONINI in the last 168 hours. BNP (last 3 results) No results for input(s): PROBNP in the last 8760 hours. HbA1C: No results for input(s): HGBA1C in the last 72  hours. CBG: Recent Labs  Lab 03/02/24 1138 03/02/24 1640 03/02/24 2034 03/03/24 0010 03/03/24 0438  GLUCAP 270* 361* 327* 237* 128*   Lipid Profile: No results for input(s): CHOL, HDL, LDLCALC, TRIG, CHOLHDL, LDLDIRECT in the last 72 hours. Thyroid  Function Tests: No results for input(s): TSH, T4TOTAL, FREET4, T3FREE, THYROIDAB in the last 72 hours. Anemia Panel: No results for input(s): VITAMINB12, FOLATE, FERRITIN, TIBC, IRON, RETICCTPCT in the last 72 hours. Sepsis Labs: No results for input(s): PROCALCITON, LATICACIDVEN in the last 168 hours.  Recent Results (from the past 240 hours)  Blood culture (routine x 2)     Status: None (Preliminary result)   Collection Time: 02/27/24  7:33 PM   Specimen: BLOOD RIGHT ARM  Result Value Ref Range Status   Specimen Description BLOOD RIGHT ARM  Final   Special Requests   Final    BOTTLES DRAWN AEROBIC AND ANAEROBIC Blood Culture adequate volume   Culture   Final    NO GROWTH 4 DAYS Performed at [facility], [address], [place], [place] [address]    Report Status PENDING  Incomplete  Blood culture (routine x 2)     Status: None (Preliminary result)   Collection Time: 02/27/24  7:33 PM   Specimen: BLOOD  Result Value Ref Range Status   Specimen Description BLOOD BLOOD LEFT ARM  Final   Special Requests   Final    BOTTLES DRAWN AEROBIC AND ANAEROBIC Blood Culture adequate volume   Culture   Final    NO GROWTH 4 DAYS Performed at [facility], [address], [place], [place] [address]    Report Status PENDING  Incomplete  Surgical PCR screen     Status: None   Collection Time: 02/28/24 12:26 PM   Specimen: Nasal Mucosa; Nasal Swab  Result Value Ref Range Status   MRSA, PCR NEGATIVE NEGATIVE Final   Staphylococcus aureus NEGATIVE NEGATIVE Final    Comment: (NOTE) The Xpert SA Assay (FDA approved for NASAL specimens in patients 53 years of age and older), is one component  of a comprehensive surveillance program. It is not intended  to diagnose infection nor to guide or monitor treatment. Performed at [facility], [address] [place], [place] [address]   Aerobic/Anaerobic Culture w Gram Stain (surgical/deep wound)     Status: None (Preliminary result)   Collection Time: 02/29/24 10:32 AM   Specimen: Soft Tissue, Other  Result Value Ref Range Status   Specimen Description TISSUE  Final   Special Requests NONE  Final   Gram Stain   Final    NO WBC SEEN RARE GRAM POSITIVE COCCI IN PAIRS Performed at [facility], [address], [place], [place] [address]    Culture   Final    FEW ENTEROCOCCUS FAECALIS SUSCEPTIBILITIES TO FOLLOW NO ANAEROBES ISOLATED; CULTURE IN PROGRESS FOR 5 DAYS    Report Status PENDING  Incomplete     Radiology Studies: VAS US  LOWER EXTREMITY ARTERIAL DUPLEX Result Date: 03/01/2024 LOWER EXTREMITY ARTERIAL DUPLEX STUDY Patient Name:  JAQUIN COY  Date of Exam:   03/01/2024 Medical Rec #: [id number]      Accession #:    [id number] Date of Birth: [birth year]      Patient Gender: M Patient Age:   89 years Exam Location:  [facility] Procedure:      VAS US  LOWER EXTREMITY ARTERIAL DUPLEX Referring Phys: TERETHA DAMME --------------------------------------------------------------------------------  Indications: Peripheral artery disease. High Risk Factors: Hypertension, hyperlipidemia, Diabetes, coronary artery                    disease.  Vascular Interventions: 02/29/24 - LEFT GREAT TOE AMPUTATION, 02/15/24 -LEFT 5TH                         TOE AMPUTATION.                         02/15/24 Left SFA/knee popliteal artery religning with new                         stent. Current ABI:            Right 0.55 - Left 0.88 Performing Technologist: Ricka Sturdivant-Jones RDMS, RVT  Examination Guidelines: A complete evaluation includes B-mode imaging, spectral Doppler, color Doppler, and power Doppler as needed of all accessible  portions of each vessel. Bilateral testing is considered an integral part of a complete examination. Limited examinations for reoccurring indications may be performed as noted.   +-----------+--------+-----+---------------+----------+--------+ LEFT       PSV cm/sRatioStenosis       Waveform  Comments +-----------+--------+-----+---------------+----------+--------+ CFA Mid    178          30-49% stenosisbiphasic           +-----------+--------+-----+---------------+----------+--------+ CFA Distal 196          30-49% stenosismonophasic         +-----------+--------+-----+---------------+----------+--------+ DFA        65                          biphasic           +-----------+--------+-----+---------------+----------+--------+ SFA Prox   184          30-49% stenosismonophasic         +-----------+--------+-----+---------------+----------+--------+ SFA Mid    138                         monophasic         +-----------+--------+-----+---------------+----------+--------+  POP Prox   162                         monophasic         +-----------+--------+-----+---------------+----------+--------+ ATA Prox   138                         monophasic         +-----------+--------+-----+---------------+----------+--------+ ATA Mid    89                          monophasic         +-----------+--------+-----+---------------+----------+--------+ ATA Distal 102                         monophasic         +-----------+--------+-----+---------------+----------+--------+ PTA Prox   91                          monophasic         +-----------+--------+-----+---------------+----------+--------+ PTA Distal 78                          monophasic         +-----------+--------+-----+---------------+----------+--------+ PERO Prox  66                          monophasic         +-----------+--------+-----+---------------+----------+--------+ PERO Distal59                           monophasic         +-----------+--------+-----+---------------+----------+--------+  Left Stent(s): +---------------+---++----------++ Prox to Stent  +---------------+---++----------++ Proximal Stent +---------------+---++----------++ Mid Stent      +---------------+---++----------++ Distal Stent   +---------------+---++----------++ Distal to Stent70monophasic +---------------+---++----------++    Summary: Left: 30-49% stenosis noted in the common femoral artery. 30-49% stenosis noted in the superficial femoral artery. Patent left SFA/above-knee popliteal stent.  See table(s) above for measurements and observations. Electronically signed by Norman Serve on 03/01/2024 at 3:26:40 PM.    Final    VAS US  ABI WITH/WO TBI Result Date: 03/01/2024  LOWER EXTREMITY DOPPLER STUDY Patient Name:  PAX REASONER  Date of Exam:   03/01/2024 Medical Rec #: [id number]      Accession #:    [id number] Date of Birth: 17-May-1956      Patient Gender: M Patient Age:   28 years Exam Location:  [facility] Procedure:      VAS US  ABI WITH/WO TBI Referring Phys: TERETHA DAMME --------------------------------------------------------------------------------  Indications: Peripheral artery disease. High Risk Factors: Hypertension, hyperlipidemia, Diabetes.  Vascular Interventions: 02/29/24 - LEFT GREAT TOE AMPUTATION, 02/15/24 -LEFT 5TH                         TOE AMPUTATION.                         02/15/24 Left SFA/knee popliteal artery religning with new                         stent. Performing Technologist: Ricka Sturdivant-Jones RDMS, RVT  Examination Guidelines: A complete evaluation includes at minimum, Doppler waveform signals  and systolic blood pressure reading at the level of bilateral brachial, anterior tibial, and posterior tibial arteries, when vessel segments are accessible. Bilateral testing is considered an  integral part of a complete examination. Photoelectric Plethysmograph (PPG) waveforms and toe systolic pressure readings are included as required and additional duplex testing as needed. Limited examinations for reoccurring indications may be performed as noted.  ABI Findings: +---------+------------------+-----+-----------------+--------+ Right    Rt Pressure (mmHg)IndexWaveform         Comment  +---------+------------------+-----+-----------------+--------+ Brachial 137                    triphasic                 +---------+------------------+-----+-----------------+--------+ PTA      81                0.55 monophasic                +---------+------------------+-----+-----------------+--------+ DP       74                0.50 monophasic                +---------+------------------+-----+-----------------+--------+ Great Toe                       severely dampened         +---------+------------------+-----+-----------------+--------+ +--------+------------------+-----+----------+-------+ Left    Lt Pressure (mmHg)IndexWaveform  Comment +--------+------------------+-----+----------+-------+ [id number]                                      +--------+------------------+-----+----------+-------+ PTA     130               0.88 biphasic          +--------+------------------+-----+----------+-------+ DP      108               0.73 monophasic        +--------+------------------+-----+----------+-------+ +-------+-----------+-----------+------------+------------+ ABI/TBIToday's ABIToday's TBIPrevious ABIPrevious TBI +-------+-----------+-----------+------------+------------+ Right  0.55       NA                                  +-------+-----------+-----------+------------+------------+ Left   0.88       NA                                  +-------+-----------+-----------+------------+------------+  Right ABIs appear increased compared to prior study  on 02/10/24. Left ABIs appear increased compared to prior study on 02/10/24.  Summary: Right: Resting right ankle-brachial index indicates moderate right lower extremity arterial disease. Left: Resting left ankle-brachial index indicates mild left lower extremity arterial disease. *See table(s) above for measurements and observations.  Electronically signed by Norman Serve on 03/01/2024 at 3:26:30 PM.    Final     Scheduled Meds:  aspirin  EC  81 mg Oral Daily   atorvastatin   40 mg Oral Daily   citalopram   20 mg Oral Daily   clopidogrel   75 mg Oral Daily   hydrocortisone -pramoxine  1 applicator Rectal QID   insulin  aspart  0-15 Units Subcutaneous TID WC   insulin  aspart  0-5 Units Subcutaneous QHS   insulin  aspart  3 Units Subcutaneous TID WC   insulin  glargine-yfgn  15  Units Subcutaneous Daily   irbesartan   300 mg Oral Daily   loratadine   10 mg Oral Daily   mupirocin  ointment  1 Application Nasal BID   pantoprazole   40 mg Oral Daily   Continuous Infusions:  piperacillin -tazobactam (ZOSYN )  IV 3.375 g (03/03/24 0214)   vancomycin  1,000 mg (03/03/24 0031)     LOS: 5 days   Fredia Skeeter, MD [facility]  03/03/2024, 7:53 AM   *Please note that this is a verbal dictation therefore any spelling or grammatical errors are due to the Dragon Medical One system interpretation.  Please page via Amion and do not message via secure chat for urgent patient care matters. Secure chat can be used for non urgent patient care matters.  How to contact the [facility] Attending or Consulting provider 7A - 7P or covering provider during after hours 7P -7A, for this patient?  Check the care team in [facility] and look for a) attending/consulting [facility] provider listed and b) the [facility] team listed. Page or secure chat 7A-7P. Log into [email or web address] and use [place]'s universal password to access. If you do not have the password, please contact the hospital operator. Locate the [facility] provider you are looking for under  [facility] and page to a number that you can be directly reached. If you still have difficulty reaching the provider, please page the [facility] (Director on Call) for the Hospitalists listed on amion for assistance.

## 2024-03-03 NOTE — Evaluation (Signed)
 Physical Therapy Evaluation Patient Details Name: [name] MRN: [id number] DOB: 08-Jul-1956 Today's Date: 03/03/2024  History of Present Illness  Pt is a 68 y.o. male admitted 7/19 for L foot osteomyelitis. He underwent L great toe amp 7/21. PMH: CAD, PAF not on anticoagulation, PSVT, PAD, renal artery stenosis, DMII, GERD, gout, HTN, hyperlipidemia, bladder cancer s/p TURBT (2013), tobacco abuse, h/o L 5th toe amp (May 2025)   Clinical Impression  Pt admitted with above diagnosis. PTA pt lived at home alone, independent. Pt currently with functional limitations due to the deficits listed below (see PT Problem List). On eval, pt demo mod I bed mobility. Supervision transfers and amb 70' with RW. Pt will benefit from acute skilled PT to increase their independence and safety with mobility to allow discharge. PT to follow acutely. No follow up services indicated. No DME needs.         If plan is discharge home, recommend the following:     Can travel by private vehicle        Equipment Recommendations None recommended by PT  Recommendations for Other Services       Functional Status Assessment Patient has had a recent decline in their functional status and demonstrates the ability to make significant improvements in function in a reasonable and predictable amount of time.     Precautions / Restrictions Precautions Precautions: Fall Recall of Precautions/Restrictions: Intact Required Braces or Orthoses: Other Brace Other Brace: post op shoe Restrictions Weight Bearing Restrictions Per Provider Order: Yes LLE Weight Bearing Per Provider Order: Weight bearing as tolerated      Mobility  Bed Mobility Overal bed mobility: Modified Independent                  Transfers Overall transfer level: Needs assistance Equipment used: Rolling walker (2 wheels) Transfers: Sit to/from Stand Sit to Stand: Supervision                Ambulation/Gait Ambulation/Gait  assistance: Supervision Gait Distance (Feet): 50 Feet Assistive device: Rolling walker (2 wheels) Gait Pattern/deviations: Step-to pattern, Antalgic Gait velocity: decreased Gait velocity interpretation: <1.31 ft/sec, indicative of household ambulator   General Gait Details: Pt tending to maintain NWB LLE. Educated on [place] in post op shoe.  Stairs            Wheelchair Mobility     Tilt Bed    Modified Rankin (Stroke Patients Only)       Balance Overall balance assessment: No apparent balance deficits (not formally assessed)                                           Pertinent Vitals/Pain Pain Assessment Pain Assessment: Faces Faces Pain Scale: Hurts little more    Home Living Family/patient expects to be discharged to:: Private residence Living Arrangements: Alone Available Help at Discharge: Neighbor;Family;Available PRN/intermittently Type of Home: Mobile home Home Access: Ramped entrance       Home Layout: One level Home Equipment: Agricultural consultant (2 wheels);Wheelchair - manual      Prior Function Prior Level of Function : Independent/Modified Independent                     Extremity/Trunk Assessment   Upper Extremity Assessment Upper Extremity Assessment: Overall WFL for tasks assessed    Lower Extremity Assessment Lower Extremity Assessment: LLE deficits/detail LLE Deficits /  Details: s/p L great toe amp    Cervical / Trunk Assessment Cervical / Trunk Assessment: Normal  Communication   Communication Communication: No apparent difficulties    Cognition Arousal: Alert Behavior During Therapy: WFL for tasks assessed/performed   PT - Cognitive impairments: No apparent impairments                         Following commands: Intact       Cueing Cueing Techniques: Verbal cues     General Comments      Exercises     Assessment/Plan    PT Assessment Patient needs continued PT services  PT  Problem List Pain;Decreased activity tolerance;Decreased mobility;Decreased knowledge of precautions       PT Treatment Interventions Gait training;Therapeutic exercise;Functional mobility training;Stair training;Therapeutic activities;Patient/family education    PT Goals (Current goals can be found in the Care Plan section)  Acute Rehab PT Goals Patient Stated Goal: home PT Goal Formulation: With patient Time For Goal Achievement: 03/17/24 Potential to Achieve Goals: Good    Frequency Min 2X/week     Co-evaluation               AM-PAC PT 6 Clicks Mobility  Outcome Measure Help needed turning from your back to your side while in a flat bed without using bedrails?: None Help needed moving from lying on your back to sitting on the side of a flat bed without using bedrails?: None Help needed moving to and from a bed to a chair (including a wheelchair)?: A Little Help needed standing up from a chair using your arms (e.g., wheelchair or bedside chair)?: A Little Help needed to walk in hospital room?: A Little Help needed climbing 3-5 steps with a railing? : A Little 6 Click Score: 20    End of Session   Activity Tolerance: Patient tolerated treatment well Patient left: in chair;with chair alarm set;with call bell/phone within reach Nurse Communication: Mobility status PT Visit Diagnosis: Difficulty in walking, not elsewhere classified (R26.2);Pain Pain - Right/Left: Left Pain - part of body: Ankle and joints of foot    Time: 9069-9044 PT Time Calculation (min) (ACUTE ONLY): 25 min   Charges:   PT Evaluation $PT Eval Low Complexity: 1 Low PT Treatments $Gait Training: 8-22 mins PT General Charges $$ ACUTE PT VISIT: 1 Visit        Sari MATSU., PT  Office # [phone]   Erven Sari Shaker 03/03/2024, 11:14 AM

## 2024-03-03 NOTE — Plan of Care (Signed)
  Problem: Education: Goal: Ability to describe self-care measures that may prevent or decrease complications (Diabetes Survival Skills Education) will improve Outcome: Progressing   Problem: Coping: Goal: Ability to adjust to condition or change in health will improve Outcome: Progressing   Problem: Fluid Volume: Goal: Ability to maintain a balanced intake and output will improve Outcome: Progressing   Problem: Health Behavior/Discharge Planning: Goal: Ability to identify and utilize available resources and services will improve Outcome: Progressing Goal: Ability to manage health-related needs will improve Outcome: Progressing   Problem: Metabolic: Goal: Ability to maintain appropriate glucose levels will improve Outcome: Progressing   Problem: Nutritional: Goal: Maintenance of adequate nutrition will improve Outcome: Progressing Goal: Progress toward achieving an optimal weight will improve Outcome: Progressing   Problem: Skin Integrity: Goal: Risk for impaired skin integrity will decrease Outcome: Progressing   Problem: Tissue Perfusion: Goal: Adequacy of tissue perfusion will improve Outcome: Progressing   Problem: Education: Goal: Knowledge of General Education information will improve Description: Including pain rating scale, medication(s)/side effects and non-pharmacologic comfort measures Outcome: Progressing   Problem: Health Behavior/Discharge Planning: Goal: Ability to manage health-related needs will improve Outcome: Progressing   Problem: Clinical Measurements: Goal: Ability to maintain clinical measurements within normal limits will improve Outcome: Progressing Goal: Will remain free from infection Outcome: Progressing Goal: Diagnostic test results will improve Outcome: Progressing Goal: Respiratory complications will improve Outcome: Progressing Goal: Cardiovascular complication will be avoided Outcome: Progressing   Problem: Activity: Goal:  Risk for activity intolerance will decrease Outcome: Progressing   Problem: Elimination: Goal: Will not experience complications related to bowel motility Outcome: Progressing Goal: Will not experience complications related to urinary retention Outcome: Progressing   Problem: Pain Managment: Goal: General experience of comfort will improve and/or be controlled Outcome: Progressing   Problem: Safety: Goal: Ability to remain free from injury will improve Outcome: Progressing   Problem: Skin Integrity: Goal: Risk for impaired skin integrity will decrease Outcome: Progressing

## 2024-03-04 ENCOUNTER — Other Ambulatory Visit (HOSPITAL_COMMUNITY): Payer: Self-pay

## 2024-03-04 DIAGNOSIS — M86172 Other acute osteomyelitis, left ankle and foot: Secondary | ICD-10-CM | POA: Diagnosis not present

## 2024-03-04 LAB — BASIC METABOLIC PANEL WITH GFR
Anion gap: 8 (ref 5–15)
BUN: 13 mg/dL (ref 8–23)
CO2: 28 mmol/L (ref 22–32)
Calcium: 8.8 mg/dL — ABNORMAL LOW (ref 8.9–10.3)
Chloride: 98 mmol/L (ref 98–111)
Creatinine, Ser: 0.97 mg/dL (ref 0.61–1.24)
GFR, Estimated: >60 mL/min (ref >60)
Glucose, Bld: 215 mg/dL — ABNORMAL HIGH (ref 70–99)
Potassium: 4.3 mmol/L (ref 3.5–5.1)
Sodium: 134 mmol/L — ABNORMAL LOW (ref 135–145)

## 2024-03-04 LAB — CBC WITH DIFFERENTIAL/PLATELET
Abs Immature Granulocytes: 0.04 K/uL (ref 0.00–0.07)
Basophils Absolute: 0.1 K/uL (ref 0.0–0.1)
Basophils Relative: 1 %
Eosinophils Absolute: 0.5 K/uL (ref 0.0–0.5)
Eosinophils Relative: 5 %
HCT: 38.4 % — ABNORMAL LOW (ref 39.0–52.0)
Hemoglobin: 13.2 g/dL (ref 13.0–17.0)
Immature Granulocytes: 0 %
Lymphocytes Relative: 15 %
Lymphs Abs: 1.7 K/uL (ref 0.7–4.0)
MCH: 32.7 pg (ref 26.0–34.0)
MCHC: 34.4 g/dL (ref 30.0–36.0)
MCV: 95 fL (ref 80.0–100.0)
Monocytes Absolute: 1 K/uL (ref 0.1–1.0)
Monocytes Relative: 9 %
Neutro Abs: 7.8 K/uL — ABNORMAL HIGH (ref 1.7–7.7)
Neutrophils Relative %: 70 %
Platelets: 317 K/uL (ref 150–400)
RBC: 4.04 MIL/uL — ABNORMAL LOW (ref 4.22–5.81)
RDW: 12.3 % (ref 11.5–15.5)
WBC: 11.1 K/uL — ABNORMAL HIGH (ref 4.0–10.5)
nRBC: 0 % (ref 0.0–0.2)

## 2024-03-04 LAB — GLUCOSE, CAPILLARY
Glucose-Capillary: 274 mg/dL — ABNORMAL HIGH (ref 70–99)
Glucose-Capillary: 283 mg/dL — ABNORMAL HIGH (ref 70–99)

## 2024-03-04 LAB — MAGNESIUM: Magnesium: 1.6 mg/dL — ABNORMAL LOW (ref 1.7–2.4)

## 2024-03-04 MED ORDER — DOXYCYCLINE HYCLATE 100 MG PO TABS
100.0000 mg | ORAL_TABLET | Freq: Two times a day (BID) | ORAL | 0 refills | Status: AC
  Filled 2024-03-04: qty 5, 3d supply, fill #0

## 2024-03-04 MED ORDER — AMOXICILLIN-POT CLAVULANATE 875-125 MG PO TABS
1.0000 | ORAL_TABLET | Freq: Two times a day (BID) | ORAL | 0 refills | Status: AC
  Filled 2024-03-04: qty 5, 3d supply, fill #0

## 2024-03-04 MED ORDER — INSULIN GLARGINE-YFGN 100 UNIT/ML ~~LOC~~ SOLN
25.0000 [IU] | Freq: Every day | SUBCUTANEOUS | Status: DC
  Administered 2024-03-04: 25 [IU] via SUBCUTANEOUS
  Filled 2024-03-04: qty 0.25

## 2024-03-04 MED ORDER — INSULIN PEN NEEDLE 32G X 4 MM MISC
0 refills | Status: DC
  Filled 2024-03-04: qty 100, 30d supply, fill #0

## 2024-03-04 MED ORDER — TOUJEO MAX SOLOSTAR 300 UNIT/ML ~~LOC~~ SOPN
35.0000 [IU] | PEN_INJECTOR | SUBCUTANEOUS | 0 refills | Status: DC
Start: 2024-03-04 — End: 2024-03-16
  Filled 2024-03-04: qty 3, 25d supply, fill #0

## 2024-03-04 NOTE — Progress Notes (Signed)
 Mobility Specialist Progress Note:   03/04/24 1502  Mobility  Activity Ambulated with assistance in hallway  Level of Assistance Standby assist, set-up cues, supervision of patient - no hands on  Assistive Device Front wheel walker  Distance Ambulated (ft) 72 ft  LLE Weight Bearing Per Provider Order WBAT  Activity Response Tolerated well  Mobility Referral Yes  Mobility visit 1 Mobility  Mobility Specialist Start Time (ACUTE ONLY) 1402  Mobility Specialist Stop Time (ACUTE ONLY) 1417  Mobility Specialist Time Calculation (min) (ACUTE ONLY) 15 min   Received pt in chair having no complaints and agreeable to mobility. Pt was asymptomatic throughout ambulation and returned to room w/o fault. Left in chair w/ call bell in reach and all needs met.   [name] Mobility Specialist  Please contact via [employer] or  Rehab Office [phone]

## 2024-03-04 NOTE — Discharge Summary (Signed)
 Physician Discharge Summary  [name] FMW:[id number] DOB: 1956-06-15 DOA: 02/27/2024  PCP: Norleen Lynwood LELON, MD  Admit date: 02/27/2024 Discharge date: 03/04/2024 30 Day Unplanned Readmission Risk Score    Flowsheet Row ED to Hosp-Admission (Current) from 02/27/2024 in [facility]  30 Day Unplanned Readmission Risk Score (%) 17.31 Filed at 03/04/2024 0801    This score is the patient's risk of an unplanned readmission within 30 days of being discharged (0 -100%). The score is based on dignosis, age, lab data, medications, orders, and past utilization.   Low:  0-14.9   Medium: 15-21.9   High: 22-29.9   Extreme: 30 and above          Admitted From: Home Disposition: Home  Recommendations for Outpatient Follow-up:  Follow up with PCP in 1-2 weeks Please obtain BMP/CBC in one week Follow-up with your endocrinologist next week Follow-up with podiatry on Tuesday Please follow up with your PCP on the following pending results: Unresulted Labs (From admission, onward)     Start     Ordered   03/04/24 0803  Basic metabolic panel  Once,   R       Question:  Specimen collection method  Answer:  Lab=Lab collect   03/04/24 0802   03/04/24 0803  Magnesium   Once,   R       Question:  Specimen collection method  Answer:  Lab=Lab collect   03/04/24 0802   02/29/24 1349  Lactoferrin, Fecal, Qualitative  Once,   R        02/29/24 1348   02/29/24 1349  Stool culture  Once,   R        02/29/24 1348   02/29/24 1348  OVA + PARASITE EXAM  Once,   R        02/29/24 1348              Home Health: None Equipment/Devices: None  Discharge Condition: Stable CODE STATUS: Full code Diet recommendation: Diabetic  Subjective: Seen and examined, no complaints.  He is very eager to go home and is requesting discharge.  Brief/Interim Summary: 68 y.o. male with a PMH significant for significant for coronary artery disease, paroxysmal atrial fibrillation not on  anticoagulation, PSVT, PAD, renal artery stenosis, type 2 diabetes mellitus on insulin  (been noncompliant), GERD, gout, hypertension, hyperlipidemia, bladder cancer status post TURP and tobacco abuse.  Patient was admitted recently with left fifth toe cellulitis/osteomyelitis leading to amputation of the left fifth toe.  Around 02/10/2024, worsening discoloration of the left foot was noted, with changes involving left great toe.  Patient was eventually found to have occluded left SFA, that was stented.  Patient underwent repeat stenting of the left SFA on 02/15/2024.  Patient is admitted with necrotic left great toe, with some cellulitic changes and swelling of the left lower leg/foot.  Patient underwent amputation of the left great toe, irrigation and excisional debridement of ulceration 1 x 1 cm subcutaneous fat tissue level, left foot on 02/29/24.  Details below.  Necrotic left great toe with osteomyelitis/ Left lower extremity cellulitis PAD status post recent left SFA stenting on 02/15/2024/ Possible left foot abscess: - Status post Amputation of left great toe through proximal phalanx head, left foot 7/21.  Cleared by podiatry for discharge.  On Zosyn .  Prelim wound culture growing E faecalis, transitioned to Augmentin  plus doxycycline  per pharmacy recommendation.  Discharging on both of these antibiotics for 5 more doses to complete the 5-day  course.  He has appointment with podiatry next Tuesday.   Poorly controlled insulin -dependent type 2 diabetes: -Patient has been out of both water and electricity at his house as these were cut off by utility companies back in May.  Has not been able to refrigerate his insulin  at home.  Therefore has not been able to use his insulin  and thus this admission. - Glucose of greater than 500 on presentation. vidently he cannot afford insurance co-pay and he has been getting samples from endocrinology. - Last A1c was 11.2.  He was managed with long-acting insulin , Premeal  regimen and SSI here.  Detailed discussions with the patient as well as Child psychotherapist.  Patient does not have any utility at home since May of this year.  He cannot store/refrigerate his insulin . He doesn't have money 2/2 to giving it to scammers, let homeless people lin home per friend Dick which has no utilities. Has a $ 900.00 unpaid [employer]. friend lives about a mile and half away states he can store insulin  in his refrigerator but pt can't stay @ his home.  Patient tells me that he has made it through this time, he is confident that he will make it in future as well.  He has a plan in place to keep his insulin  in a cup and the ice 24/7.  He tells me that he can get ice from his neighbors and he has been doing that.  He does not think it is complicated for him and he is confident that he can handle that.  He is requesting a discharge.  I have refilled his insulin  to over [facility] pharmacy.  He has appointment with his endocrinologist next week.   Pseudohyponatremia/hyponatremia: -Sodium has improved from 127-137.   -He has longstanding issues with hyponatremia back to at least 2022   CAD -Stable.     -EKG without acute ischemic changes.  Continue aspirin  and Plavix .   PAF Not on anticoagulation.  On aspirin  and Plavix .   GERD Continue Protonix .   Hypertension Currently normotensive.   Continue irbesartan .   Hyperlipidemia Continue Lipitor .   Mood disorder Continue Celexa .   Hypomagnesemia: - Resolved  Discharge Diagnoses:  Principal Problem:   Osteomyelitis (HCC) Active Problems:   Essential hypertension   PAD (peripheral artery disease) (HCC)   Diabetes (HCC)   Cellulitis in diabetic foot (HCC)   Skin ulcer of left heel with fat layer exposed (HCC)    Discharge Instructions   Allergies as of 03/04/2024       Reactions   Cefepime  Hives   Had allergic reaction to one of these 3 agents, unclear which one   Metoprolol  Hives   Had allergic reaction to one of these  3 agents, unclear which one *Per RN, highly likely this is the cause of allergic rxn   Myrbetriq  [mirabegron ] Hives   Had allergic reaction to one of these 3 agents, unclear which one        Medication List     STOP taking these medications    clindamycin  300 MG capsule Commonly known as: Cleocin        TAKE these medications    amoxicillin -clavulanate 875-125 MG tablet Commonly known as: AUGMENTIN  Take 1 tablet by mouth 2 (two) times daily for 5 doses.   aspirin  EC 81 MG tablet Take 1 tablet (81 mg total) by mouth daily. Swallow whole.   atorvastatin  40 MG tablet Commonly known as: LIPITOR  TAKE 1 TABLET BY MOUTH ONCE DAILY  citalopram  20 MG tablet Commonly known as: CELEXA  Take 1 tablet (20 mg total) by mouth daily.   clopidogrel  75 MG tablet Commonly known as: Plavix  Take 1 tablet (75 mg total) by mouth daily.   doxycycline  100 MG tablet Commonly known as: VIBRA -TABS Take 1 tablet (100 mg total) by mouth 2 (two) times daily for 5 doses.   irbesartan  300 MG tablet Commonly known as: AVAPRO  Take 1 tablet (300 mg total) by mouth daily.   loratadine  10 MG tablet Commonly known as: CLARITIN  Take 10 mg by mouth daily as needed.   ONE TOUCH ULTRA 2 w/Device Kit Use as directed to check blood sugars twice a day   OneTouch Delica Plus Lancet30G Misc CHECK BLOOD SUGAR TWICE DAILY   OneTouch Ultra test strip Generic drug: glucose blood CHECK BLOOD SUGAR TWICE DAILY   oxyCODONE  5 MG immediate release tablet Commonly known as: Oxy IR/ROXICODONE  Take 1 tablet (5 mg total) by mouth every 4 (four) hours as needed for severe pain (pain score 7-10).   pantoprazole  40 MG tablet Commonly known as: PROTONIX  Take 1 tablet (40 mg total) by mouth daily.   Toujeo  Max SoloStar 300 UNIT/ML Solostar Pen Generic drug: insulin  glargine (2 Unit Dial ) Inject 35 Units into the skin every morning. What changed: how much to take        Follow-up Information     Norleen Lynwood ORN, MD Follow up.   Specialties: Internal Medicine, Radiology Contact information: [address] [place] [address] [phone]         [facility] Endocrinology Follow up on 03/09/2024.   Why: Appointment scheduled for 03/09/2024 at 11:50 am with Dr. Sam Pass information: [address] [place], [address] [place] [address] [phone]        Norleen Lynwood ORN, MD Follow up in 1 week(s).   Specialties: Internal Medicine, Radiology Contact information: [address] [place] [address] [phone]                Allergies  Allergen Reactions   Cefepime  Hives    Had allergic reaction to one of these 3 agents, unclear which one   Metoprolol  Hives    Had allergic reaction to one of these 3 agents, unclear which one  *Per RN, highly likely this is the cause of allergic rxn   Myrbetriq  [Mirabegron ] Hives    Had allergic reaction to one of these 3 agents, unclear which one    Consultations: Podiatry   Procedures/Studies: VAS US  LOWER EXTREMITY ARTERIAL DUPLEX Result Date: 03/01/2024 LOWER EXTREMITY ARTERIAL DUPLEX STUDY Patient Name:  [name]  Date of Exam:   03/01/2024 Medical Rec #: [id number]      Accession #:    [id number] Date of Birth: Jun 03, 1956      Patient Gender: M Patient Age:   69 years Exam Location:  [facility] Procedure:      VAS US  LOWER EXTREMITY ARTERIAL DUPLEX Referring Phys: TERETHA DAMME --------------------------------------------------------------------------------  Indications: Peripheral artery disease. High Risk Factors: Hypertension, hyperlipidemia, Diabetes, coronary artery                    disease.  Vascular Interventions: 02/29/24 - LEFT GREAT TOE AMPUTATION, 02/15/24 -LEFT 5TH                         TOE AMPUTATION.  02/15/24 Left SFA/knee popliteal artery religning with new                         stent. Current ABI:            Right 0.55 - Left 0.88 Performing  Technologist: Ricka Sturdivant-Jones RDMS, RVT  Examination Guidelines: A complete evaluation includes B-mode imaging, spectral Doppler, color Doppler, and power Doppler as needed of all accessible portions of each vessel. Bilateral testing is considered an integral part of a complete examination. Limited examinations for reoccurring indications may be performed as noted.   +-----------+--------+-----+---------------+----------+--------+ LEFT       PSV cm/sRatioStenosis       Waveform  Comments +-----------+--------+-----+---------------+----------+--------+ CFA Mid    178          30-49% stenosisbiphasic           +-----------+--------+-----+---------------+----------+--------+ CFA Distal 196          30-49% stenosismonophasic         +-----------+--------+-----+---------------+----------+--------+ DFA        65                          biphasic           +-----------+--------+-----+---------------+----------+--------+ SFA Prox   184          30-49% stenosismonophasic         +-----------+--------+-----+---------------+----------+--------+ SFA Mid    138                         monophasic         +-----------+--------+-----+---------------+----------+--------+ POP Prox   162                         monophasic         +-----------+--------+-----+---------------+----------+--------+ ATA Prox   138                         monophasic         +-----------+--------+-----+---------------+----------+--------+ ATA Mid    89                          monophasic         +-----------+--------+-----+---------------+----------+--------+ ATA Distal 102                         monophasic         +-----------+--------+-----+---------------+----------+--------+ PTA Prox   91                          monophasic         +-----------+--------+-----+---------------+----------+--------+ PTA Distal 78                          monophasic          +-----------+--------+-----+---------------+----------+--------+ PERO Prox  66                          monophasic         +-----------+--------+-----+---------------+----------+--------+ PERO Distal59                          monophasic         +-----------+--------+-----+---------------+----------+--------+  Left Stent(s): +---------------+---++----------++ Prox to Stent  +---------------+---++----------++ Proximal Stent +---------------+---++----------++ Mid Stent      +---------------+---++----------++ Distal Stent   +---------------+---++----------++ Distal to Stent74monophasic +---------------+---++----------++    Summary: Left: 30-49% stenosis noted in the common femoral artery. 30-49% stenosis noted in the superficial femoral artery. Patent left SFA/above-knee popliteal stent.  See table(s) above for measurements and observations. Electronically signed by Norman Serve on 03/01/2024 at 3:26:40 PM.    Final    VAS US  ABI WITH/WO TBI Result Date: 03/01/2024  LOWER EXTREMITY DOPPLER STUDY Patient Name:  [name]  Date of Exam:   03/01/2024 Medical Rec #: [id number]      Accession #:    [id number] Date of Birth: Oct 31, 1955      Patient Gender: M Patient Age:   34 years Exam [facility]  [facility] Procedure:      VAS US  ABI WITH/WO TBI Referring Phys: TERETHA DAMME --------------------------------------------------------------------------------  Indications: Peripheral artery disease. High Risk Factors: Hypertension, hyperlipidemia, Diabetes.  Vascular Interventions: 02/29/24 - LEFT GREAT TOE AMPUTATION, 02/15/24 -LEFT 5TH                         TOE AMPUTATION.                         02/15/24 Left SFA/knee popliteal artery religning with new                         stent. Performing Technologist: Ricka Sturdivant-Jones RDMS, RVT  Examination Guidelines: A complete evaluation includes at minimum, Doppler  waveform signals and systolic blood pressure reading at the level of bilateral brachial, anterior tibial, and posterior tibial arteries, when vessel segments are accessible. Bilateral testing is considered an integral part of a complete examination. Photoelectric Plethysmograph (PPG) waveforms and toe systolic pressure readings are included as required and additional duplex testing as needed. Limited examinations for reoccurring indications may be performed as noted.  ABI Findings: +---------+------------------+-----+-----------------+--------+ Right    Rt Pressure (mmHg)IndexWaveform         Comment  +---------+------------------+-----+-----------------+--------+ Brachial 137                    triphasic                 +---------+------------------+-----+-----------------+--------+ PTA      81                0.55 monophasic                +---------+------------------+-----+-----------------+--------+ DP       74                0.50 monophasic                +---------+------------------+-----+-----------------+--------+ Great Toe                       severely dampened         +---------+------------------+-----+-----------------+--------+ +--------+------------------+-----+----------+-------+ Left    Lt Pressure (mmHg)IndexWaveform  Comment +--------+------------------+-----+----------+-------+ [id number]                                      +--------+------------------+-----+----------+-------+ PTA     130               0.88 biphasic          +--------+------------------+-----+----------+-------+  DP      108               0.73 monophasic        +--------+------------------+-----+----------+-------+ +-------+-----------+-----------+------------+------------+ ABI/TBIToday's ABIToday's TBIPrevious ABIPrevious TBI +-------+-----------+-----------+------------+------------+ Right  0.55       NA                                   +-------+-----------+-----------+------------+------------+ Left   0.88       NA                                  +-------+-----------+-----------+------------+------------+  Right ABIs appear increased compared to prior study on 02/10/24. Left ABIs appear increased compared to prior study on 02/10/24.  Summary: Right: Resting right ankle-brachial index indicates moderate right lower extremity arterial disease. Left: Resting left ankle-brachial index indicates mild left lower extremity arterial disease. *See table(s) above for measurements and observations.  Electronically signed by Norman Serve on 03/01/2024 at 3:26:30 PM.    Final    DG Foot 2 Views Left Result Date: 02/29/2024 CLINICAL DATA:  Status post great toe amputation EXAM: LEFT FOOT - 2 VIEW COMPARISON:  Left foot radiograph dated 02/27/2024 FINDINGS: Postsurgical changes of the great toe amputation at the mid proximal phalanx. Prior amputation of the fifth toe at the metatarsal phalangeal joint. Plantar and dorsal calcaneal spur. IMPRESSION: Postsurgical changes of the great toe amputation at the mid proximal phalanx. Electronically Signed   By: Limin  Xu M.D.   On: 02/29/2024 11:25   DG Foot Complete Left Result Date: 02/27/2024 EXAM: 3 or more VIEW(S) XRAY OF THE LEFT FOOT 02/27/2024 07:23:23 PM COMPARISON: Outside facility radiographs dated 02/22/2024. CLINICAL HISTORY: Great toe necrosis. FINDINGS: BONES AND JOINTS: Status post fifth digit amputation at the MTP joint. Mild lucency and cortical destruction involving the first distal tuft with associated soft tissue gas, suggesting osteomyelitis. SOFT TISSUES: Soft tissue gas noted in the region of the first distal tuft. IMPRESSION: 1. Suspected osteomyelitis involving the 1st distal tuft. 2. Status post fifth digit amputation at the MTP joint. Electronically signed by: Pinkie Pebbles MD 02/27/2024 07:40 PM EDT RP Workstation: [id number]   DG Foot Complete Left Result Date:  02/22/2024 Please see detailed radiograph report in office note.  VAS US  LOWER EXTREMITY ARTERIAL DUPLEX Result Date: 02/15/2024 LOWER EXTREMITY ARTERIAL DUPLEX STUDY Patient Name:  [name]  Date of Exam:   02/15/2024 Medical Rec #: [id number]      Accession #:    [id number] Date of Birth: [birth year]      Patient Gender: M Patient Age:   44 years Exam Location:  [facility] Procedure:      VAS US  LOWER EXTREMITY ARTERIAL DUPLEX Referring Phys: NORMAN SERVE --------------------------------------------------------------------------------  Indications: Peripheral artery disease. High Risk Factors: Hypertension, hyperlipidemia, current smoker, coronary artery                    disease. Other Factors: Afib, LLE 5th toe amputation 01/02/2024.  Vascular Interventions: LLE angiogram w/ balloon angioplasty of left SFA/ above                         knee popliteal artery and religning with new  stent(02/15/2024). LLE angiogram with SFA stent placement                         on 01/01/2024. Current ABI:            0.50/0.32 - prior to intervention of 02/15/2024 Limitations: Scannig conditions (backwards with lights on) Comparison Study: Previous exam on 02/10/2024 50-74% of CFA & right SFA stent                   occlusion. Performing Technologist: Ezzie Potters RVT, RDMS  Examination Guidelines: A complete evaluation includes B-mode imaging, spectral Doppler, color Doppler, and power Doppler as needed of all accessible portions of each vessel. Bilateral testing is considered an integral part of a complete examination. Limited examinations for reoccurring indications may be performed as noted.   Right Stent(s): +---------------+---+---------------+----------++ Prox to Stent  127               monophasic +---------------+---+---------------+----------++ Proximal Stent 125               monophasic +---------------+---+---------------+----------++ Mid Stent      30350-99%  stenosismonophasic +---------------+---+---------------+----------++ Distal Stent   33650-99% stenosismonophasic +---------------+---+---------------+----------++ Distal to [id number]               monophasic +---------------+---+---------------+----------++    +-----------+--------+-----+---------------+----------+--------+ LEFT       PSV cm/sRatioStenosis       Waveform  Comments +-----------+--------+-----+---------------+----------+--------+ CFA Prox   118                         biphasic           +-----------+--------+-----+---------------+----------+--------+ CFA Mid    107                         biphasic           +-----------+--------+-----+---------------+----------+--------+ CFA Distal 255          50-74% stenosismonophasic         +-----------+--------+-----+---------------+----------+--------+ DFA        68                          biphasic           +-----------+--------+-----+---------------+----------+--------+ SFA Prox   258          50-74% stenosismonophasic         +-----------+--------+-----+---------------+----------+--------+ POP Prox   107                         monophasic         +-----------+--------+-----+---------------+----------+--------+ POP Mid    99                          monophasic         +-----------+--------+-----+---------------+----------+--------+ POP Distal 100                         monophasic         +-----------+--------+-----+---------------+----------+--------+ TP Trunk   117                         monophasic         +-----------+--------+-----+---------------+----------+--------+ ATA Prox   107  monophasic         +-----------+--------+-----+---------------+----------+--------+ ATA Mid    134                         monophasic         +-----------+--------+-----+---------------+----------+--------+ ATA Distal 120                          monophasic         +-----------+--------+-----+---------------+----------+--------+ PTA Prox   75                          monophasic         +-----------+--------+-----+---------------+----------+--------+ PTA Mid    143                         monophasic         +-----------+--------+-----+---------------+----------+--------+ PTA Distal 126                         monophasic         +-----------+--------+-----+---------------+----------+--------+ PERO Prox  69                          monophasic         +-----------+--------+-----+---------------+----------+--------+ PERO Mid   63                          monophasic         +-----------+--------+-----+---------------+----------+--------+ PERO Distal51                          monophasic         +-----------+--------+-----+---------------+----------+--------+ DP         64                          monophasic         +-----------+--------+-----+---------------+----------+--------+  Summary: Right: 50-74% stenosis noted in the common femoral artery. 50-74% stenosis noted in the proximal superficial femoral artery. Stenosis is noted within the mid and distal portion of SFA stent.  See table(s) above for measurements and observations. Electronically signed by Fonda Rim on 02/15/2024 at 5:47:50 PM.    Final    PERIPHERAL VASCULAR CATHETERIZATION Result Date: 02/15/2024 Images from the original result were not included. Patient name: [name] MRN: [id number] DOB: Jan 31, 1956 Sex: male 02/15/2024 Pre-operative Diagnosis: CL TI of left leg with rest pain and tissue loss Post-operative diagnosis:  Same Surgeon:  Norman GORMAN Serve, MD Procedure Performed: Ultrasound-guided access of right common femoral artery Aortogram and bilateral lower extremity angiogram Third order cannulation of left popliteal artery Balloon angioplasty and stenting of left SFA/above-knee popliteal artery, 6 mm x 150 mm Eluvia 32 minutes moderate  sedation with fentanyl  and Versed  Indications: Mr. Schnebly is a 68 year old male with PAD who recently underwent left SFA stenting on 01/01/2024 with Dr. Sheree.  Due to his financial and social issues he is compliant with medications and presented to the office on 7/2 where he was noted to have worsening discoloration of his left foot with skin changes to his left great toe and his noninvasive studies demonstrated a severely depressed ABI with an occluded left SFA stent on duplex.  Risks and benefits of  repeat angiogram with intervention were reviewed, he expressed understanding and elected to proceed. Findings: Widely patent aorta and paired renal arteries bilaterally. The distal aorta and common iliac arteries are calcified but do not appear to have any flow-limiting stenosis. On the left, the common femoral artery, profunda and proximal segment are widely patent.  The stent to the mid SFA is occluded with reconstitution shortly after the distal stent edge.  The popliteal artery is widely patent.  There is three-vessel runoff with dominant PT and AT flow. On the right the common femoral artery and profunda are widely patent.  There is a flush CTO of the SFA.  The above-knee popliteal is reconstituted in the popliteal artery is widely patent.  There is three-vessel runoff with dominant PT and AT flow.  Procedure:  The patient was identified in the holding area and taken to the cath lab  The patient was then placed supine on the table and prepped and draped in the usual sterile fashion.  A time out was called.  Ultrasound was used to evaluate the right common femoral artery.  It was patent .  A digital ultrasound image was acquired.  A micropuncture needle was used to access the right common femoral artery under ultrasound guidance.  An 018 wire was advanced without resistance and a micropuncture sheath was placed.  The 018 wire was removed and a benson wire was placed.  The micropuncture sheath was exchanged for a 5  french sheath.  An omniflush catheter was advanced over the wire to the level of L-1.  An abdominal angiogram was obtained.  Next, using the omniflush catheter and a Bentson wire, the aortic bifurcation was crossed and the catheter was placed into theleft external iliac artery and left runoff was obtained. This demonstrated the above findings.  A glide advantage wire was placed through the Omni Flush catheter and into the SFA.  The short 5 French sheath was then exchanged for a 6 [place] by 45 cm catapult sheath and the patient was systemically heparinized.  Using the glide advantage wire and a quick cross catheter I was able to cross the occluded stents.  An angiogram via the quick cross catheter demonstrated true lumen crossing wire and patency of the popliteal artery.  This was then treated by realigning the stent with a 6 mm x 150 mm Eluvia stent which was postdilated with a 5 mm Mustang balloon.  A completion angiogram demonstrated wide patency with minimal residual stenosis and preserved three-vessel runoff.  The long 6 French sheath was then exchanged for a short 6 [place] sheath and a glide advantage wire was removed.  right runoff was performed via retrograde sheath injections which demonstrated the above findings. Contrast: 60 cc Sedation: 32 minutes Impression: Successful recanalization of the occluded left SFA stent and relining with a new 6 mm Eluvia stent. Right lower extremity with a necessitate a femoral to above-knee popliteal artery bypass Norman GORMAN Serve MD [facility] Office: [phone]  VAS US  LOWER EXTREMITY ARTERIAL DUPLEX Result Date: 02/10/2024 LOWER EXTREMITY ARTERIAL DUPLEX STUDY Patient Name:  [name]  Date of Exam:   02/10/2024 Medical Rec #: [id number]      Accession #:    [id number] Date of Birth: [birth year]      Patient Gender: M Patient Age:   81 years Exam Location:  [address] Procedure:      VAS US  LOWER EXTREMITY ARTERIAL DUPLEX Referring  Phys: PENNE COLORADO --------------------------------------------------------------------------------  Indications: Peripheral  artery disease. High Risk Factors: Hypertension, Diabetes, current smoker.  Vascular Interventions: 01/01/24 - Left LE Angiography. Current ABI:            0.50/0.32 Performing Technologist: Garnette Rockers  Examination Guidelines: A complete evaluation includes B-mode imaging, spectral Doppler, color Doppler, and power Doppler as needed of all accessible portions of each vessel. Bilateral testing is considered an integral part of a complete examination. Limited examinations for reoccurring indications may be performed as noted.   +-----------+--------+-----+---------------+----------+--------+ LEFT       PSV cm/sRatioStenosis       Waveform  Comments +-----------+--------+-----+---------------+----------+--------+ CFA Prox   133                                            +-----------+--------+-----+---------------+----------+--------+ CFA Distal 277          50-74% stenosistriphasic          +-----------+--------+-----+---------------+----------+--------+ DFA        149                         triphasic          +-----------+--------+-----+---------------+----------+--------+ SFA Prox   62                          biphasic           +-----------+--------+-----+---------------+----------+--------+ SFA Mid    28                          monophasic         +-----------+--------+-----+---------------+----------+--------+ SFA Distal 59                          monophasic         +-----------+--------+-----+---------------+----------+--------+ POP Prox   41                          monophasic         +-----------+--------+-----+---------------+----------+--------+ POP Distal 23                          monophasic         +-----------+--------+-----+---------------+----------+--------+ ATA Distal 24                          monophasic          +-----------+--------+-----+---------------+----------+--------+ PTA Distal 18                          monophasic         +-----------+--------+-----+---------------+----------+--------+ PERO Distal11                          monophasic         +-----------+--------+-----+---------------+----------+--------+  Left Stent(s): +------------------+--------+--------+----------+--------+ SFA Mid - SFA DistPSV cm/sStenosisWaveform  Comments +------------------+--------+--------+----------+--------+ Prox to Stent     27              monophasic         +------------------+--------+--------+----------+--------+ Proximal Stent            occluded                   +------------------+--------+--------+----------+--------+  Mid Stent                 occluded                   +------------------+--------+--------+----------+--------+ Distal Stent              occluded                   +------------------+--------+--------+----------+--------+ Distal to Stent   59              monophasic         +------------------+--------+--------+----------+--------+ Stent occluded, but distal flow is reconstituted just past stent.   Summary: Left: 50-74% stenosis noted in the common femoral artery. Occlusion is noted within the SFA Mid-Distal stent.  See table(s) above for measurements and observations. Electronically signed by Gaile New MD on 02/10/2024 at 9:58:45 PM.    Final    VAS US  ABI WITH/WO TBI Result Date: 02/10/2024  LOWER EXTREMITY DOPPLER STUDY Patient Name:  [name]  Date of Exam:   02/10/2024 Medical Rec #: [id number]      Accession #:    [id number] Date of Birth: 1956-05-20      Patient Gender: M Patient Age:   36 years Exam Location:  [address] Procedure:      VAS US  ABI WITH/WO TBI Referring Phys: PENNE COLORADO --------------------------------------------------------------------------------  Indications: Peripheral artery disease. High Risk Factors:  Hypertension, Diabetes. Other Factors: 01/01/24 - Left LE Angiography.  Comparison Study: 12/31/23 Performing Technologist: Garnette Rockers  Examination Guidelines: A complete evaluation includes at minimum, Doppler waveform signals and systolic blood pressure reading at the level of bilateral brachial, anterior tibial, and posterior tibial arteries, when vessel segments are accessible. Bilateral testing is considered an integral part of a complete examination. Photoelectric Plethysmograph (PPG) waveforms and toe systolic pressure readings are included as required and additional duplex testing as needed. Limited examinations for reoccurring indications may be performed as noted.  ABI Findings: +---------+------------------+-----+-------------------+--------+ Right    Rt Pressure (mmHg)IndexWaveform           Comment  +---------+------------------+-----+-------------------+--------+ Brachial 173                                                +---------+------------------+-----+-------------------+--------+ PTA      85                0.49 dampened monophasic         +---------+------------------+-----+-------------------+--------+ DP       86                0.50 dampened monophasic         +---------+------------------+-----+-------------------+--------+ Great Toe32                0.18 Abnormal                    +---------+------------------+-----+-------------------+--------+ +---------+------------------+-----+-------------------+-------+ Left     Lt Pressure (mmHg)IndexWaveform           Comment +---------+------------------+-----+-------------------+-------+ Brachial 171                                               +---------+------------------+-----+-------------------+-------+ PTA      55  0.32 dampened monophasic        +---------+------------------+-----+-------------------+-------+ DP       36                0.21 dampened monophasic         +---------+------------------+-----+-------------------+-------+ Great Toe0                 0.00 Absent                     +---------+------------------+-----+-------------------+-------+ +-------+-----------+-----------+------------+------------+ ABI/TBIToday's ABIToday's TBIPrevious ABIPrevious TBI +-------+-----------+-----------+------------+------------+ Right  0.50       0.18       0.41        0            +-------+-----------+-----------+------------+------------+ Left   0.32       0          0.89        0            +-------+-----------+-----------+------------+------------+  Left ABIs appear decreased compared to prior study on 12/31/23.  Summary: Right: Resting right ankle-brachial index indicates moderate right lower extremity arterial disease. The right toe-brachial index is abnormal. Left: Resting left ankle-brachial index indicates severe left lower extremity arterial disease. The left toe-brachial index is abnormal. *See table(s) above for measurements and observations.  Electronically signed by Gaile New MD on 02/10/2024 at 9:58:18 PM.    Final      Discharge Exam: Vitals:   03/04/24 0409 03/04/24 0736  BP: (!) 133/58 (!) 136/49  Pulse: 72 69  Resp: 17 16  Temp: 98.3 F (36.8 C) 97.7 F (36.5 C)  SpO2: 97% 98%   Vitals:   03/03/24 1634 03/03/24 1957 03/04/24 0409 03/04/24 0736  BP: 122/61 132/65 (!) 133/58 (!) 136/49  Pulse: 68 77 72 69  Resp: 16 17 17 16   Temp:  98 F (36.7 C) 98.3 F (36.8 C) 97.7 F (36.5 C)  TempSrc:  Oral Oral Oral  SpO2: 100% 99% 97% 98%  Weight:      Height:        General: Pt is alert, awake, not in acute distress Cardiovascular: RRR, S1/S2 +, no rubs, no gallops Respiratory: CTA bilaterally, no wheezing, no rhonchi Abdominal: Soft, NT, ND, bowel sounds + Extremities: no edema, no cyanosis, dressing in left foot.    The results of significant diagnostics from this hospitalization (including imaging, microbiology,  ancillary and laboratory) are listed below for reference.     Microbiology: Recent Results (from the past 240 hours)  Blood culture (routine x 2)     Status: None   Collection Time: 02/27/24  7:33 PM   Specimen: BLOOD RIGHT ARM  Result Value Ref Range Status   Specimen Description BLOOD RIGHT ARM  Final   Special Requests   Final    BOTTLES DRAWN AEROBIC AND ANAEROBIC Blood Culture adequate volume   Culture   Final    NO GROWTH 5 DAYS Performed at [facility], [address] [place], [place] [address]    Report Status 03/03/2024 FINAL  Final  Blood culture (routine x 2)     Status: None   Collection Time: 02/27/24  7:33 PM   Specimen: BLOOD  Result Value Ref Range Status   Specimen Description BLOOD BLOOD LEFT ARM  Final   Special Requests   Final    BOTTLES DRAWN AEROBIC AND ANAEROBIC Blood Culture adequate volume   Culture   Final    NO  GROWTH 5 DAYS Performed at [facility], [address], [place], [place] [address]    Report Status 03/03/2024 FINAL  Final  Surgical PCR screen     Status: None   Collection Time: 02/28/24 12:26 PM   Specimen: Nasal Mucosa; Nasal Swab  Result Value Ref Range Status   MRSA, PCR NEGATIVE NEGATIVE Final   Staphylococcus aureus NEGATIVE NEGATIVE Final    Comment: (NOTE) The Xpert SA Assay (FDA approved for NASAL specimens in patients 95 years of age and older), is one component of a comprehensive surveillance program. It is not intended to diagnose infection nor to guide or monitor treatment. Performed at [facility], [address] [place], [place] [address]   Aerobic/Anaerobic Culture w Gram Stain (surgical/deep wound)     Status: None (Preliminary result)   Collection Time: 02/29/24 10:32 AM   Specimen: Soft Tissue, Other  Result Value Ref Range Status   Specimen Description TISSUE  Final   Special Requests NONE  Final   Gram Stain   Final    NO WBC SEEN RARE GRAM POSITIVE COCCI IN PAIRS Performed at [facility], [address], [place], [place] [address]    Culture   Final    FEW ENTEROCOCCUS FAECALIS NO ANAEROBES ISOLATED; CULTURE IN PROGRESS FOR 5 DAYS    Report Status PENDING  Incomplete   Organism ID, Bacteria ENTEROCOCCUS FAECALIS  Final      Susceptibility   Enterococcus faecalis - MIC*    AMPICILLIN  <=2 SENSITIVE Sensitive     VANCOMYCIN  1 SENSITIVE Sensitive     GENTAMICIN  SYNERGY RESISTANT Resistant     * FEW ENTEROCOCCUS FAECALIS  Gastrointestinal Panel by PCR , Stool     Status: None   Collection Time: 02/29/24  1:48 PM   Specimen: Stool  Result Value Ref Range Status   Campylobacter species NOT DETECTED NOT DETECTED Final   Plesimonas shigelloides NOT DETECTED NOT DETECTED Final   Salmonella species NOT DETECTED NOT DETECTED Final   Yersinia enterocolitica NOT DETECTED NOT DETECTED Final   Vibrio species NOT DETECTED NOT DETECTED Final   Vibrio cholerae NOT DETECTED NOT DETECTED Final   Enteroaggregative E coli (EAEC) NOT DETECTED NOT DETECTED Final   Enteropathogenic E coli (EPEC) NOT DETECTED NOT DETECTED Final   Enterotoxigenic E coli (ETEC) NOT DETECTED NOT DETECTED Final   Shiga like toxin producing E coli (STEC) NOT DETECTED NOT DETECTED Final   Shigella/Enteroinvasive E coli (EIEC) NOT DETECTED NOT DETECTED Final   Cryptosporidium NOT DETECTED NOT DETECTED Final   Cyclospora cayetanensis NOT DETECTED NOT DETECTED Final   Entamoeba histolytica NOT DETECTED NOT DETECTED Final   Giardia lamblia NOT DETECTED NOT DETECTED Final   Adenovirus F40/41 NOT DETECTED NOT DETECTED Final   Astrovirus NOT DETECTED NOT DETECTED Final   Norovirus GI/GII NOT DETECTED NOT DETECTED Final   Rotavirus A NOT DETECTED NOT DETECTED Final   Sapovirus (I, II, IV, and V) NOT DETECTED NOT DETECTED Final    Comment: Performed at [facility], [address] [place], [place] [address]     Labs: BNP (last 3 results) No results for input(s): BNP in the last 8760  hours. Basic Metabolic Panel: Recent Labs  Lab 02/28/24 0405 02/29/24 0655 03/01/24 0906 03/02/24 0526 03/03/24 0601  NA 133* 134* 131* 134* 137  K 3.6 3.6 3.9 3.9 4.1  CL 98 98 97* 100 97*  CO2 26 25 25 22 30   GLUCOSE 76  177* 249* 196* 139*  BUN 9 11 13 13 8   CREATININE 0.83 0.74 0.87 0.84 0.86  CALCIUM  8.9 8.5* 9.0 8.8* 9.1  MG  --  1.4* 1.2*  --  1.4*  PHOS  --  3.2  --   --   --    Liver Function Tests: Recent Labs  Lab 02/27/24 1726 02/29/24 0655  AST 20  --   ALT 13  --   ALKPHOS 98  --   BILITOT 1.1  --   PROT 6.9  --   ALBUMIN 2.7* 2.2*   No results for input(s): LIPASE, AMYLASE in the last 168 hours. No results for input(s): AMMONIA in the last 168 hours. CBC: Recent Labs  Lab 02/29/24 0655 03/01/24 0906 03/02/24 0526 03/03/24 0601 03/04/24 0929  WBC 13.7* 17.0* 15.9* 11.7* 11.1*  NEUTROABS 9.9*  --   --   --  7.8*  HGB 13.3 14.5 14.3 13.7 13.2  HCT 38.0* 41.1 40.1 39.4 38.4*  MCV 93.8 92.2 93.5 93.8 95.0  PLT 232 271 264 270 317   Cardiac Enzymes: No results for input(s): CKTOTAL, CKMB, CKMBINDEX, TROPONINI in the last 168 hours. BNP: Invalid input(s): POCBNP CBG: Recent Labs  Lab 03/03/24 0844 03/03/24 1144 03/03/24 1629 03/03/24 2008 03/04/24 0555  GLUCAP 262* 339* 262* 260* 274*   D-Dimer No results for input(s): DDIMER in the last 72 hours. Hgb A1c No results for input(s): HGBA1C in the last 72 hours. Lipid Profile No results for input(s): CHOL, HDL, LDLCALC, TRIG, CHOLHDL, LDLDIRECT in the last 72 hours. Thyroid  function studies No results for input(s): TSH, T4TOTAL, T3FREE, THYROIDAB in the last 72 hours.  Invalid input(s): FREET3 Anemia work up No results for input(s): VITAMINB12, FOLATE, FERRITIN, TIBC, IRON, RETICCTPCT in the last 72 hours. Urinalysis    Component Value Date/Time   COLORURINE STRAW (A) 02/27/2024 1716   APPEARANCEUR CLEAR 02/27/2024 1716   LABSPEC  1.027 02/27/2024 1716   PHURINE 5.0 02/27/2024 1716   GLUCOSEU >=500 (A) 02/27/2024 1716   GLUCOSEU >=1000 (A) 02/22/2024 1454   HGBUR NEGATIVE 02/27/2024 1716   BILIRUBINUR NEGATIVE 02/27/2024 1716   KETONESUR NEGATIVE 02/27/2024 1716   PROTEINUR NEGATIVE 02/27/2024 1716   UROBILINOGEN 0.2 02/22/2024 1454   NITRITE NEGATIVE 02/27/2024 1716   LEUKOCYTESUR TRACE (A) 02/27/2024 1716   Sepsis Labs Recent Labs  Lab 03/01/24 0906 03/02/24 0526 03/03/24 0601 03/04/24 0929  WBC 17.0* 15.9* 11.7* 11.1*   Microbiology Recent Results (from the past 240 hours)  Blood culture (routine x 2)     Status: None   Collection Time: 02/27/24  7:33 PM   Specimen: BLOOD RIGHT ARM  Result Value Ref Range Status   Specimen Description BLOOD RIGHT ARM  Final   Special Requests   Final    BOTTLES DRAWN AEROBIC AND ANAEROBIC Blood Culture adequate volume   Culture   Final    NO GROWTH 5 DAYS Performed at [facility], [address] [place], [place] [address]    Report Status 03/03/2024 FINAL  Final  Blood culture (routine x 2)     Status: None   Collection Time: 02/27/24  7:33 PM   Specimen: BLOOD  Result Value Ref Range Status   Specimen Description BLOOD BLOOD LEFT ARM  Final   Special Requests   Final    BOTTLES DRAWN AEROBIC AND ANAEROBIC Blood Culture adequate volume   Culture   Final    NO GROWTH 5 DAYS Performed at [facility], [address] [place], [place] [address]    Report Status 03/03/2024 FINAL  Final  Surgical PCR screen     Status: None   Collection Time: 02/28/24 12:26 PM   Specimen: Nasal Mucosa; Nasal Swab  Result Value Ref Range Status   MRSA, PCR NEGATIVE NEGATIVE Final   Staphylococcus aureus NEGATIVE NEGATIVE Final    Comment: (NOTE) The Xpert SA Assay (FDA approved for NASAL specimens in patients 27 years of age and older), is one component of a comprehensive surveillance program. It is not intended to diagnose infection nor to guide or monitor  treatment. Performed at [facility], [address], [place], [place] [address]   Aerobic/Anaerobic Culture w Gram Stain (surgical/deep wound)     Status: None (Preliminary result)   Collection Time: 02/29/24 10:32 AM   Specimen: Soft Tissue, Other  Result Value Ref Range Status   Specimen Description TISSUE  Final   Special Requests NONE  Final   Gram Stain   Final    NO WBC SEEN RARE GRAM POSITIVE COCCI IN PAIRS Performed at [facility], [address], [place], [place] [address]    Culture   Final    FEW ENTEROCOCCUS FAECALIS NO ANAEROBES ISOLATED; CULTURE IN PROGRESS FOR 5 DAYS    Report Status PENDING  Incomplete   Organism ID, Bacteria ENTEROCOCCUS FAECALIS  Final      Susceptibility   Enterococcus faecalis - MIC*    AMPICILLIN  <=2 SENSITIVE Sensitive     VANCOMYCIN  1 SENSITIVE Sensitive     GENTAMICIN  SYNERGY RESISTANT Resistant     * FEW ENTEROCOCCUS FAECALIS  Gastrointestinal Panel by PCR , Stool     Status: None   Collection Time: 02/29/24  1:48 PM   Specimen: Stool  Result Value Ref Range Status   Campylobacter species NOT DETECTED NOT DETECTED Final   Plesimonas shigelloides NOT DETECTED NOT DETECTED Final   Salmonella species NOT DETECTED NOT DETECTED Final   Yersinia enterocolitica NOT DETECTED NOT DETECTED Final   Vibrio species NOT DETECTED NOT DETECTED Final   Vibrio cholerae NOT DETECTED NOT DETECTED Final   Enteroaggregative E coli (EAEC) NOT DETECTED NOT DETECTED Final   Enteropathogenic E coli (EPEC) NOT DETECTED NOT DETECTED Final   Enterotoxigenic E coli (ETEC) NOT DETECTED NOT DETECTED Final   Shiga like toxin producing E coli (STEC) NOT DETECTED NOT DETECTED Final   Shigella/Enteroinvasive E coli (EIEC) NOT DETECTED NOT DETECTED Final   Cryptosporidium NOT DETECTED NOT DETECTED Final   Cyclospora cayetanensis NOT DETECTED NOT DETECTED Final   Entamoeba histolytica NOT DETECTED NOT DETECTED Final   Giardia lamblia NOT DETECTED NOT  DETECTED Final   Adenovirus F40/41 NOT DETECTED NOT DETECTED Final   Astrovirus NOT DETECTED NOT DETECTED Final   Norovirus GI/GII NOT DETECTED NOT DETECTED Final   Rotavirus A NOT DETECTED NOT DETECTED Final   Sapovirus (I, II, IV, and V) NOT DETECTED NOT DETECTED Final    Comment: Performed at [facility], [address] [place], [place] [address]    FURTHER DISCHARGE INSTRUCTIONS:   Get Medicines reviewed and adjusted: Please take all your medications with you for your next visit with your Primary MD   Laboratory/radiological data: Please request your Primary MD to go over all hospital tests and procedure/radiological results at the follow up, please ask your Primary MD to get all Hospital records sent to his/her office.   In some cases, they will be blood work,  cultures and biopsy results pending at the time of your discharge. Please request that your primary care M.D. goes through all the records of your hospital data and follows up on these results.   Also Note the following: If you experience worsening of your admission symptoms, develop shortness of breath, life threatening emergency, suicidal or homicidal thoughts you must seek medical attention immediately by calling 911 or calling your MD immediately  if symptoms less severe.   You must read complete instructions/literature along with all the possible adverse reactions/side effects for all the Medicines you take and that have been prescribed to you. Take any new Medicines after you have completely understood and accpet all the possible adverse reactions/side effects.    patient was instructed, not to drive, operate heavy machinery, perform activities at heights, swimming or participation in water activities or provide baby-sitting services while on Pain, Sleep and Anxiety Medications; until their outpatient Physician has advised to do so again. Also recommended to not to take more than prescribed Pain, Sleep and Anxiety  Medications.  It is not advisable to combine anxiety, sleep and pain medications without talking with your primary care provider.     Wear Seat belts while driving.   Please note: You were cared for by a hospitalist during your hospital stay. Once you are discharged, your primary care physician will handle any further medical issues. Please note that NO REFILLS for any discharge medications will be authorized once you are discharged, as it is imperative that you return to your primary care physician (or establish a relationship with a primary care physician if you do not have one) for your post hospital discharge needs so that they can reassess your need for medications and monitor your lab values  Time coordinating discharge: Over 30 minutes  SIGNED:   Fredia Skeeter, MD  [facility] 03/04/2024, 10:50 AM *Please note that this is a verbal dictation therefore any spelling or grammatical errors are due to the Dragon [employer] One system interpretation. If 7PM-7AM, please contact night-coverage [email or web address]

## 2024-03-04 NOTE — TOC Transition Note (Addendum)
 Transition of Care [facility]) - Discharge Note   Patient Details  Name: [name] MRN: [id number] Date of Birth: 1955/12/22  Transition of Care [facility]) CM/SW Contact:  Rosalva Jon Bloch, RN Phone Number: 03/04/2024, 12:14 PM   Clinical Narrative:    Patient will DC to: home Anticipated DC date: 03/04/2024 Family notified: yes Transport by: Venia Amble 7/19 for L foot osteomyelitis.              S/P L great toe amp 7/21   Per MD patient ready for DC today. RN, patient, patient's friend Darina notified of DC. Randy to assist with refrigerating insulin  @ his home. Pt states without utilities @  home. States he will be able  to manage with friends help, bathing and insulin  care.    RX med to be provided to pt prior to d/c from [facility].  Post hospital f/u noted on AVS. States will have no transportation issues once home.   RNCM will sign off for now as intervention is no longer needed. Please consult us  again if new needs arise.    Final next level of care: Home/Self Care Barriers to Discharge: No Barriers Identified   Patient Goals and CMS Choice            Discharge Placement                       Discharge Plan and Services Additional resources added to the After Visit Summary for                                       Social Drivers of Health (SDOH) Interventions SDOH Screenings   Food Insecurity: Food Insecurity Present (02/28/2024)  Housing: High Risk (02/28/2024)  Transportation Needs: Unmet Transportation Needs (02/28/2024)  Utilities: At Risk (02/28/2024)  Alcohol Screen: Low Risk  (06/10/2023)  Depression (PHQ2-9): Low Risk  (02/22/2024)  Financial Resource Strain: Medium Risk (02/17/2024)  Physical Activity: Inactive (06/10/2023)  Social Connections: Socially Isolated (02/28/2024)  Stress: Stress Concern Present (02/17/2024)  Tobacco Use: High Risk (02/28/2024)  Health Literacy: Inadequate Health Literacy (02/17/2024)     Readmission  Risk Interventions    12/30/2023    1:17 PM  Readmission Risk Prevention Plan  Post Dischage Appt Complete  Medication Screening Complete  Transportation Screening Complete

## 2024-03-04 NOTE — Discharge Instructions (Addendum)
 [facility] [address], [place], [place] [address] [phone] (Main)  January - October: Fourth Saturday of the month, 9:00am - 11:00am. November - December: Third Saturday of the month, 9:00am - 11:00am. Also open prior to Thanksgiving and Christmas. Call for more information. [email or web address] EligibilityOpen to all. [facility] [address] [place], [place] [address] [phone] (Main)  First and third Wednesday of the month, 9:00am - 11:15am. [email or web address] EligibilityOpen to all. [facility] FOOD PANTRY [address] [place], [place] [address] [phone] (Main)  Second and fourth Tuesday of the month, 11:00am - 1:00pm. Location alternates between churches. Call for more information. [email or web address] EligibilityOpen to all. FeesNone.   FOOD PANTRY [facility] MINISTRY [address] [place], [place], [place] [address] [phone] (Main)  Mon 9:00am - 3:30pm; Tue 9:00am - 3:30pm; Wed 9:00am - 3:30pm; Thu 9:00am - 3:30pm; Fri 9:00am - 3:30pm; [email or web address] EligibilityOpen to all experiencing food insecurity. Does not serve [employer] residents. FeesNone. [facility]ALTH [address] [place], [place] [address] [phone] (Main)  Mon 12:00am - 2:00pm; [email or web address].si [email or web address] EligibilityOpen to all. FeesNone.  [employer] [address] [place], [place] [address] [phone]  Out Of The  [facility] Market http://farley.info/   Community Resource Guide Shelters The United Way's "211" is a great source of information about community services available.  Access by dialing 2-1-1 from anywhere in  , or by website -  PooledIncome.pl.   Other Armed forces technical officer Number and Address  [facility] Housing for homeless and needy men with substance abuse issues 5 day Covid Quarantine [phone] [address] [place], [place]  [employer] of [place] Emergency assistance for [employer] only [employer] [phone] [address]. [address] [place], [place]  [employer] of the [place] Domestic violence shelter for women and their children [phone] [place], [place]  [facility] Domestic violence shelter for women and their children Each family gets their own unit and can quarantine after admission. [phone] [place], [place]  [facility]) / Resources for the [employer] center for the homeless Information and referral to housing resources Counseling Showers Laundry Barbershop Phone bank Mailroom Computer lab Medical clinic Bike maintenance center 14 Day covid quarantine [phone] [address]  [address] [place], [place]  [facility] [employer] Men's Shelter Emergency housing Food Emergency financial assistance Permanent supportive housing [phone] [address] [place], [place]  [employer] Crisis assistance Medication Housing Food Utility assistance [phone] [address] [place], [place]   [phone] [address], [place], [place]  [employer] of [place]       Transitional housing Case Chartered certified accountant assistance [phone] [address] [place], [place]  [facility],  [employer] for adult men and women Can admit with MD clearance from the hospital after positive covid test.  Intake Hotline [phone] [address] [place], [place]  24-hour Crisis Line for those Facing Homelessness   Information and referral to community resources [phone]  [employer] and additional resources. Can admit with MD clearance after positive covid test.  Prefer online applications (blocked on [facility]  computers)/ currently full. Will be able to do intake at the office starting in May.  [phone] Admin only location     [facility]) now has a full time staff to take referrals for all individuals needing shelter or housing placement. They do not do direct services or have beds, but are in charge of assessing and coordinating placement for individuals needing shelter. The phone number for coordinated entry is [phone].

## 2024-03-04 NOTE — Progress Notes (Signed)
 Physical Therapy Treatment Patient Details Name: [name] MRN: [id number] DOB: 1956-04-30 Today's Date: 03/04/2024   History of Present Illness Pt is a 69 y.o. male admitted 7/19 for L foot osteomyelitis. He underwent L great toe amp 7/21. PMH: CAD, PAF not on anticoagulation, PSVT, PAD, renal artery stenosis, DMII, GERD, gout, HTN, hyperlipidemia, bladder cancer s/p TURBT (2013), tobacco abuse, h/o L 5th toe amp (May 2025)    PT Comments  Pt resting in bed on arrival, pleasant and agreeable to session and demonstrating continued progress towards acute goals. Pt demonstrating bed mobility, transfers and gait with RW for support with grossly CGA -supervision for safety with no overt LOB noted throughout and pt demonstrating fair safety awareness with good hand placement and safe RW use. Pt with noted foot drop on L with pt stating he was catching L great toe often prior to sx. Educated pt on foot up brace as pt would benefit for increased safety and decreased fall risk, however pt adamantly refusing. Pt was educated on continued walker use to maximize functional independence, safety, and decrease risk for falls.  Pt continues to benefit from skilled PT services to progress toward functional mobility goals.      If plan is discharge home, recommend the following: Help with stairs or ramp for entrance;Assistance with cooking/housework   Can travel by private vehicle        Equipment Recommendations  None recommended by PT    Recommendations for Other Services       Precautions / Restrictions Precautions Precautions: Fall Recall of Precautions/Restrictions: Intact Required Braces or Orthoses: Other Brace Other Brace: post op shoe Restrictions Weight Bearing Restrictions Per Provider Order: Yes LLE Weight Bearing Per Provider Order: Weight bearing as tolerated     Mobility  Bed Mobility Overal bed mobility: Modified Independent                  Transfers Overall transfer  level: Needs assistance Equipment used: Rolling walker (2 wheels) Transfers: Sit to/from Stand Sit to Stand: Supervision           General transfer comment: supervision for safety    Ambulation/Gait Ambulation/Gait assistance: Supervision Gait Distance (Feet): 100 Feet Assistive device: Rolling walker (2 wheels) Gait Pattern/deviations: Step-to pattern, Antalgic, Decreased dorsiflexion - left, Decreased stance time - left, Steppage Gait velocity: decreased     General Gait Details: Pt tending to maintain NWB LLE. Educated on [place] in post op shoe. pt with noted foot drop on L with pt reporting that L toe was catching prior to sx, educated pt on brace, however pt adamantely declining, steppage gait with increased L hip and kee flexion to clear LLE during swing   Stairs Stairs:  (pt reporting he has ramped entrance to home)           Wheelchair Mobility     Tilt Bed    Modified Rankin (Stroke Patients Only)       Balance Overall balance assessment: No apparent balance deficits (not formally assessed)                                          Communication Communication Communication: No apparent difficulties  Cognition Arousal: Alert Behavior During Therapy: WFL for tasks assessed/performed   PT - Cognitive impairments: No apparent impairments  Following commands: Intact      Cueing Cueing Techniques: Verbal cues  Exercises      General Comments General comments (skin integrity, edema, etc.): VSS on RA      Pertinent Vitals/Pain Pain Assessment Pain Assessment: Faces Faces Pain Scale: Hurts a little bit Pain Location: L foot and ankle Pain Descriptors / Indicators: Burning, Sore Pain Intervention(s): Monitored during session, Limited activity within patient's tolerance    Home Living                          Prior Function            PT Goals (current goals can now be found in the  care plan section) Acute Rehab PT Goals Patient Stated Goal: to go home PT Goal Formulation: With patient Time For Goal Achievement: 03/17/24 Progress towards PT goals: Progressing toward goals    Frequency    Min 2X/week      PT Plan      Co-evaluation              AM-PAC PT 6 Clicks Mobility   Outcome Measure  Help needed turning from your back to your side while in a flat bed without using bedrails?: None Help needed moving from lying on your back to sitting on the side of a flat bed without using bedrails?: None Help needed moving to and from a bed to a chair (including a wheelchair)?: A Little Help needed standing up from a chair using your arms (e.g., wheelchair or bedside chair)?: A Little Help needed to walk in hospital room?: A Little Help needed climbing 3-5 steps with a railing? : A Little 6 Click Score: 20    End of Session   Activity Tolerance: Patient tolerated treatment well Patient left: in chair;with chair alarm set;with call bell/phone within reach Nurse Communication: Mobility status PT Visit Diagnosis: Difficulty in walking, not elsewhere classified (R26.2);Pain Pain - Right/Left: Left Pain - part of body: Ankle and joints of foot     Time: 9191-9160 PT Time Calculation (min) (ACUTE ONLY): 31 min  Charges:    $Gait Training: 23-37 mins PT General Charges $$ ACUTE PT VISIT: 1 Visit                     Analaura Messler R. PTA Acute Rehabilitation Services Office: [phone]   Therisa CHRISTELLA Boor 03/04/2024, 9:16 AM

## 2024-03-04 NOTE — Progress Notes (Signed)
 AVS reviewed and teaching done with [facility]. Teaching include follow up appointment, medications and how to take, reasons to contact MD or call 911, pt verbalized understanding of all teaching. TOC medications brought to pt and taken when DC lounge rolled pt out. Pt DC to lounge where taxi will be called for transport home. Wheelchair to [name] by staff

## 2024-03-04 NOTE — Plan of Care (Signed)
  Problem: Metabolic: Goal: Ability to maintain appropriate glucose levels will improve Outcome: Progressing   Problem: Education: Goal: Knowledge of General Education information will improve Description: Including pain rating scale, medication(s)/side effects and non-pharmacologic comfort measures Outcome: Progressing   Problem: Clinical Measurements: Goal: Ability to maintain clinical measurements within normal limits will improve Outcome: Progressing Goal: Will remain free from infection Outcome: Progressing Goal: Diagnostic test results will improve Outcome: Progressing Goal: Respiratory complications will improve Outcome: Progressing Goal: Cardiovascular complication will be avoided Outcome: Progressing   Problem: Pain Managment: Goal: General experience of comfort will improve and/or be controlled Outcome: Progressing   Problem: Safety: Goal: Ability to remain free from injury will improve Outcome: Progressing

## 2024-03-05 LAB — AEROBIC/ANAEROBIC CULTURE W GRAM STAIN (SURGICAL/DEEP WOUND): Gram Stain: NONE SEEN

## 2024-03-07 ENCOUNTER — Telehealth: Payer: Self-pay | Admitting: Internal Medicine

## 2024-03-07 ENCOUNTER — Ambulatory Visit: Admitting: Podiatry

## 2024-03-07 ENCOUNTER — Telehealth: Payer: Self-pay | Admitting: Licensed Clinical Social Worker

## 2024-03-07 ENCOUNTER — Telehealth: Payer: Self-pay | Admitting: *Deleted

## 2024-03-07 NOTE — Telephone Encounter (Signed)
 H&V Care Navigation CSW Progress Note  Clinical Social Worker contacted patient by phone to f/u on post hospital needs. Was able to reach him at [phone]. Shared upcoming appts, pt requests that these be texted to him, I will do so. Also discussed reaching out to [facility], paperwork had been sent by PCP for rep payee through DSS. Encouraged him to let her know about utilities being shut off as well-since until he is able to get his check he has been unable to re-establish with utility company. Pt agreeable. Texted his appts and the contact information for [place], [place] caseworker [employer]. Pt will drive to his appts.  Patient is participating in a Managed Medicaid Plan:  No, [facility] Medicare  SDOH Screenings   Food Insecurity: Food Insecurity Present (02/28/2024)  Housing: High Risk (02/28/2024)  Transportation Needs: Unmet Transportation Needs (02/28/2024)  Utilities: At Risk (02/28/2024)  Alcohol Screen: Low Risk  (06/10/2023)  Depression (PHQ2-9): Low Risk  (02/22/2024)  Financial Resource Strain: Medium Risk (02/17/2024)  Physical Activity: Inactive (06/10/2023)  Social Connections: Socially Isolated (02/28/2024)  Stress: Stress Concern Present (02/17/2024)  Tobacco Use: High Risk (02/28/2024)  Health Literacy: Inadequate Health Literacy (02/17/2024)    [name], MSW, LCSW Clinical Social Worker II [facility] Heart/Vascular Care Navigation  [phone]- work cell phone (preferred)

## 2024-03-07 NOTE — Transitions of Care (Post Inpatient/ED Visit) (Signed)
   03/07/2024  Name: [name] MRN: [id number] DOB: [birth year]  Today's TOC FU Call Status: Today's TOC FU Call Status:: Unsuccessful Call (1st Attempt) Unsuccessful Call (1st Attempt) Date: 03/07/24  Attempted to reach the patient regarding the most recent Inpatient visit.  Left HIPAA compliant voice message requesting call back   Follow Up Plan: Additional outreach attempts will be made to reach the patient to complete the Transitions of Care (Post Inpatient/ED visit) call.   Pls call/ message for questions,  Heily Carlucci Mckinney Eulah Walkup, RN, BSN, CCRN Alumnus RN Care Manager  Transitions of Care  [facility] - [facility] [phone]: direct office

## 2024-03-07 NOTE — Telephone Encounter (Signed)
 Copied from CRM #[id number]. Topic: General - Other >> Mar 07, 2024 12:45 PM Corin V wrote: Reason for CRM: Patient returned missed call from Laine Toussey with Transition of Care office. Attempted to call her line and transfer patient but got voice mail. Advised Patient she would call back again. CB# [phone]

## 2024-03-07 NOTE — Telephone Encounter (Signed)
 Copied from CRM [id number]. Topic: Clinical - Request for Lab/Test Order >> Mar 07, 2024  2:34 PM Franky GRADE wrote: Reason for CRM: [facility] is calling because patient had surgery and they are requesting home health services to be sent to [facility] home health.

## 2024-03-08 ENCOUNTER — Other Ambulatory Visit: Payer: Self-pay

## 2024-03-08 ENCOUNTER — Telehealth: Payer: Self-pay | Admitting: *Deleted

## 2024-03-08 ENCOUNTER — Ambulatory Visit (INDEPENDENT_AMBULATORY_CARE_PROVIDER_SITE_OTHER): Admitting: Podiatry

## 2024-03-08 ENCOUNTER — Inpatient Hospital Stay (HOSPITAL_COMMUNITY)
Admission: EM | Admit: 2024-03-08 | Discharge: 2024-03-10 | DRG: 865 | Disposition: A | Discharge status: Home / Self Care | Attending: Internal Medicine | Admitting: Internal Medicine

## 2024-03-08 VITALS — BP 154/61 | HR 78 | Temp 97.9°F | Resp 18

## 2024-03-08 DIAGNOSIS — Z1152 Encounter for screening for COVID-19: Secondary | ICD-10-CM

## 2024-03-08 DIAGNOSIS — L03032 Cellulitis of left toe: Secondary | ICD-10-CM | POA: Diagnosis not present

## 2024-03-08 DIAGNOSIS — J439 Emphysema, unspecified: Secondary | ICD-10-CM | POA: Diagnosis present

## 2024-03-08 DIAGNOSIS — B348 Other viral infections of unspecified site: Secondary | ICD-10-CM | POA: Diagnosis not present

## 2024-03-08 DIAGNOSIS — M869 Osteomyelitis, unspecified: Secondary | ICD-10-CM | POA: Diagnosis not present

## 2024-03-08 DIAGNOSIS — R110 Nausea: Secondary | ICD-10-CM | POA: Diagnosis not present

## 2024-03-08 DIAGNOSIS — I10 Essential (primary) hypertension: Secondary | ICD-10-CM | POA: Diagnosis not present

## 2024-03-08 DIAGNOSIS — F17290 Nicotine dependence, other tobacco product, uncomplicated: Secondary | ICD-10-CM | POA: Diagnosis present

## 2024-03-08 DIAGNOSIS — F17200 Nicotine dependence, unspecified, uncomplicated: Principal | ICD-10-CM

## 2024-03-08 DIAGNOSIS — Z9889 Other specified postprocedural states: Secondary | ICD-10-CM

## 2024-03-08 DIAGNOSIS — Z89422 Acquired absence of other left toe(s): Secondary | ICD-10-CM

## 2024-03-08 DIAGNOSIS — E871 Hypo-osmolality and hyponatremia: Secondary | ICD-10-CM | POA: Diagnosis present

## 2024-03-08 DIAGNOSIS — L02612 Cutaneous abscess of left foot: Secondary | ICD-10-CM | POA: Diagnosis not present

## 2024-03-08 DIAGNOSIS — Z7902 Long term (current) use of antithrombotics/antiplatelets: Secondary | ICD-10-CM

## 2024-03-08 DIAGNOSIS — Z8551 Personal history of malignant neoplasm of bladder: Secondary | ICD-10-CM

## 2024-03-08 DIAGNOSIS — J9601 Acute respiratory failure with hypoxia: Principal | ICD-10-CM | POA: Diagnosis present

## 2024-03-08 DIAGNOSIS — E1165 Type 2 diabetes mellitus with hyperglycemia: Secondary | ICD-10-CM | POA: Diagnosis not present

## 2024-03-08 DIAGNOSIS — I480 Paroxysmal atrial fibrillation: Secondary | ICD-10-CM | POA: Diagnosis not present

## 2024-03-08 DIAGNOSIS — Z79899 Other long term (current) drug therapy: Secondary | ICD-10-CM

## 2024-03-08 DIAGNOSIS — Z801 Family history of malignant neoplasm of trachea, bronchus and lung: Secondary | ICD-10-CM

## 2024-03-08 DIAGNOSIS — L97522 Non-pressure chronic ulcer of other part of left foot with fat layer exposed: Secondary | ICD-10-CM | POA: Diagnosis not present

## 2024-03-08 DIAGNOSIS — R0602 Shortness of breath: Secondary | ICD-10-CM | POA: Diagnosis not present

## 2024-03-08 DIAGNOSIS — Z961 Presence of intraocular lens: Secondary | ICD-10-CM | POA: Diagnosis present

## 2024-03-08 DIAGNOSIS — R069 Unspecified abnormalities of breathing: Secondary | ICD-10-CM | POA: Diagnosis not present

## 2024-03-08 DIAGNOSIS — I700 Atherosclerosis of aorta: Secondary | ICD-10-CM | POA: Diagnosis present

## 2024-03-08 DIAGNOSIS — K219 Gastro-esophageal reflux disease without esophagitis: Secondary | ICD-10-CM | POA: Diagnosis not present

## 2024-03-08 DIAGNOSIS — Z8249 Family history of ischemic heart disease and other diseases of the circulatory system: Secondary | ICD-10-CM | POA: Diagnosis not present

## 2024-03-08 DIAGNOSIS — Z794 Long term (current) use of insulin: Secondary | ICD-10-CM | POA: Diagnosis not present

## 2024-03-08 DIAGNOSIS — Z7982 Long term (current) use of aspirin: Secondary | ICD-10-CM

## 2024-03-08 DIAGNOSIS — Z833 Family history of diabetes mellitus: Secondary | ICD-10-CM

## 2024-03-08 DIAGNOSIS — E785 Hyperlipidemia, unspecified: Secondary | ICD-10-CM | POA: Diagnosis present

## 2024-03-08 DIAGNOSIS — I2510 Atherosclerotic heart disease of native coronary artery without angina pectoris: Secondary | ICD-10-CM | POA: Diagnosis present

## 2024-03-08 DIAGNOSIS — Z91199 Patient's noncompliance with other medical treatment and regimen due to unspecified reason: Secondary | ICD-10-CM

## 2024-03-08 DIAGNOSIS — R946 Abnormal results of thyroid function studies: Secondary | ICD-10-CM | POA: Diagnosis present

## 2024-03-08 DIAGNOSIS — Z860101 Personal history of adenomatous and serrated colon polyps: Secondary | ICD-10-CM

## 2024-03-08 DIAGNOSIS — Z882 Allergy status to sulfonamides status: Secondary | ICD-10-CM

## 2024-03-08 DIAGNOSIS — R0902 Hypoxemia: Principal | ICD-10-CM | POA: Diagnosis present

## 2024-03-08 DIAGNOSIS — R079 Chest pain, unspecified: Secondary | ICD-10-CM | POA: Diagnosis present

## 2024-03-08 DIAGNOSIS — Z881 Allergy status to other antibiotic agents status: Secondary | ICD-10-CM

## 2024-03-08 DIAGNOSIS — Z9841 Cataract extraction status, right eye: Secondary | ICD-10-CM

## 2024-03-08 DIAGNOSIS — Z888 Allergy status to other drugs, medicaments and biological substances status: Secondary | ICD-10-CM

## 2024-03-08 MED ORDER — AMOXICILLIN-POT CLAVULANATE 875-125 MG PO TABS: 1.0000 | ORAL_TABLET | Freq: Two times a day (BID) | ORAL | 0 refills | Status: DC

## 2024-03-08 NOTE — Progress Notes (Signed)
 Subjective:  Patient ID: [name], male    DOB: Nov 05, 1955,  MRN: [id number]  Chief Complaint  Patient presents with   Post-op Follow-up    DOS: 02/29/2024 Procedure: 1. Amputation of left great toe through proximal phalanx head, left foot 2. Irrigation and excisional debridement of ulceration 1x1 cm to subcutaneous fat tissue level, left foot     68 y.o. male seen for post op check.  Patient seen 1 week s/p above procedures.  Received report from the nurse that called him that he was washing the amputation site with stored Rainwater.   Instructions from hospital leave the dressing clean dry and intact until he followed up.  He has been wearing his postop shoe.  He is almost done with his antibiotics runs out today.  Review of Systems: Negative except as noted in the HPI. Denies N/V/F/Ch.   Objective:   Constitutional Well developed. Well nourished.  Vascular Foot warm and well perfused. Capillary refill normal to all digits.   No calf pain with palpation  Neurologic Normal speech. Oriented to person, place, and time. Epicritic sensation diminished to left foot  Dermatologic Left hallux amputation site with evidence of significant maceration and some necrosis along the incision line however it is still coapted and does not appear to dehisced at this time.  The eschar on the lateral fifth toe amputation site came off with debridement and there is an underlying ulceration in the subcutaneous fat tissue layer there.  The wounds on the plantar foot and midfoot have necrotic fat tissue in the wound bed without significant evidence of infection surrounding.        Orthopedic: Status post left hallux amputation partial at the proximal phalanx head level and heel debridement   Radiographs: Postsurgical changes of the great toe amputation at the mid proximal phalanx.  Pathology: A. LEFT GREAT TOE, AMPUTATION:  Gangrenous and coagulative necrosis with marked acute cellulitis   extending to bone showing acute osteomyelitis  Proximal bone with articular cartilage negative for acute inflammation  and osteomyelitis   Micro: FEW ENTEROCOCCUS FAECALIS  RARE VAGOCOCCUS FLUVIALIS  Standardized susceptibility testing for this organism is not available.   Assessment:   1. Cellulitis and abscess of toe of left foot   2. Ulcer of left foot with fat layer exposed (HCC)   3. Post-operative state    Osteomyelitis of left hallux distal phalanx status post partial left hallux amputation and heel wound debridement  Plan:  Patient was evaluated and treated and all questions answered.  1 week s/p left hallux partial amputation through proximal phalanx head and left heel ulceration debridement to subcutaneous fat tissue -Unfortunately due to noncompliance and washing the wounds with Rainwater he has jeopardized both of the amputation sites.  There is some evidence of necrosis and maceration due to noncompliance and poor wound hygiene. -I excisionally debrided any necrotic tissue around the amputation site and wounds.  As there is no significant surrounding infection we will proceed with oral antibiotics and wound care.  I applied a Betadine wet-to-dry dressing that I want him to leave intact until next appointment.  Reinforced the need to leave the foot dry and do not wash the wounds with Rainwater - If possible patient would benefit from home health RN for dressing changes twice weekly however unsure if this can be coordinated as an outpatient. -XR: Expected postoperative changes -WB Status: Weightbearing as tolerated in postop shoe -Sutures: Remain intact 2 to 3 weeks. -Medications/ABX: Patient is status post  1 week course of Augmentin  and doxycycline , E Rx for Augmentin  x 1 week -Dressing: Dressing changed today with Betadine wet-to-dry leave intact until next appointment - F/u Plan: 1 week return for recheck        Marolyn JULIANNA Honour, DPM [facility] /  [facility]

## 2024-03-08 NOTE — Patient Instructions (Signed)
 Visit Information  Thank you for taking time to visit with me today. Please don't hesitate to contact me if I can be of assistance to you before our next scheduled telephone appointment.  Our next appointment is by telephone on Thursday 03/17/24 at 1:00 pm  Please call the care guide team at [phone] if you need to cancel or reschedule your appointment.   Patient Self Care Activities:  Attend all scheduled provider appointments Call pharmacy for medication refills 3-7 days in advance of running out of medications Call provider office for new concerns or questions  Participate in Transition of Care Program/Attend TOC scheduled calls Take medications as prescribed   Work with the social worker to address care coordination needs and will continue to work with the clinical team to address health care and disease management related needs Continue pacing activity as your recuperation from recent surgery continues Use assistive devices as needed to prevent falls- your cane and wearing your boot If you believe your condition is getting worse- contact your care providers (doctors) promptly- reaching out to your doctor early when you have concerns can prevent you from having to go to the hospital Please take every effort and precaution to keep your left foot clean and dry and follow the instructions for wound care that your surgeon (Dr. Malvin) provided   Following is a copy of your care plan:   Goals Addressed             This Visit's Progress    VBCI Transitions of Care (TOC) Care Plan       Problems:  Recent Hospitalization for treatment of DMII Diet/Nutrition/Food Resources  Functional/Safety concern: no utilities x last several months: confirmed cardiology LCSW active/ established and working with DSS staff [facility] services barrier: unable to find notation in inpatient notes that this concern was addressed prior to discharge:  verified patient's insurance company has  reached out to PCP on 03/07/24 to request home health services-- however- possibility limited options given no utilities in home: facilitated scheduling of PCP hospital follow up visit for 03/15/24: will message PCP accordingly (no response noted to insurance message on 03/07/24) Limited social support: lives alone; neighbors assist with needs on limited basis; widower Medication management barrier: currently keeping insulin  stored in portable cooler, on ice Multiple SDOH barriers at baseline x last several months: message sent to LCSW for proactive collaboration (2) recent surgical (L) toe amputations: in May 2025- (L) 5th toe; most recent hospitalization: (L) great toe amputation; wearing boot on (L) foot and using cane Most recent hospitalization:  July 19-25, 2025 for 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  Goal:  Over the next 30 days, the patient will not experience hospital readmission  Interventions:  Transitions of Care: week # 1/ day # 1 Durable Medical Equipment (DME) needs assessed with patient/caregiver Doctor Visits  - discussed the importance of doctor visits Communication with PCP and established LCSW re: enrollment into [facility] 30-day program Contacted provider for patient needs podiatry provider re: needs/ patient report, prior to upcoming office visit this afternoon: patient reports ongoing poor living conditions that may impact ongoing care at home Arranged PCP follow-up within 7 days (Care Guide Scheduled) Post discharge activity limitations prescribed by provider reviewed Post-op wound/incision care reviewed with patient/caregiver Reviewed Signs and symptoms of infection Provided education around hygiene concerns per patient report: currently using [facility] and  urinal for all toileting needs: has to manually bring 5 gallon buckets of water into his  commode to deploy manual flush when he empties his BSC: provided education around need to be as clean as possible  when performing this task, as well as precautions around heaving lifting in setting of 2 recent (L) toe amputations/ fall risk  Provided education around need to keep incision clean and dry, along with inquiry how patient will do this at home without running water: I will use rain water that I collect at home, or I can sometimes take a shower at my neighbor's house every now and then Advised patient to not use collected rainwater for wound care; provided education around proper and basic wound care along with signs/ symptoms infection as well as concerns around infection control given his report of his living conditions today: encouraged him to discuss all of this with care providers/ established LCSW; Sent immediate secure message via instant chat to surgical provider make him aware of patient's report today, prior to scheduled appointment in 45 minutes   Diabetes Interventions: Provided education to patient about basic DM disease process Discussed plans with patient for ongoing care management follow up and provided patient with direct contact information for care management team Reviewed scheduled/upcoming provider appointments including: endocrinology provider: 03/09/24: patient confirms plans to attend as scheduled Review of patient status, including review of consultants reports, relevant laboratory and other test results, and medications completed Assessed social determinant of health barriers Reports taking long acting insulin  35 U QD as prescribed post- most recent hospital discharge; reports keeping insulin  cold using portable cooler since no utilities in home: provided education around need to have ongoing clean fresh unmelted ice in cooler- he states he knows that; Patient reports he is not currently checking blood sugars at home: I don't want to, I have too much going on with all of the problems I have; confirms he plans to attend scheduled endocrinology provider appointment on 03/09/24;  patient states he blames hospital from back in May and June for not making sure I had a way to keep my insulin  cold- I lost $5,000.00 worth of insulin , it went bad and so I just didn't take it  Encouraged patient to maintain communication with established LCSW/ DSS- and to make all care providers aware of his ongoing needs/ questions/ challenges: he verbalizes understanding and agreement    Lab Results  Component Value Date   HGBA1C 11.2 (H) 02/22/2024    Patient Self Care Activities:  Attend all scheduled provider appointments Call pharmacy for medication refills 3-7 days in advance of running out of medications Call provider office for new concerns or questions  Participate in Transition of Care Program/Attend TOC scheduled calls Take medications as prescribed   Work with the social worker to address care coordination needs and will continue to work with the clinical team to address health care and disease management related needs Continue pacing activity as your recuperation from recent surgery continues Use assistive devices as needed to prevent falls- your cane and wearing your boot If you believe your condition is getting worse- contact your care providers (doctors) promptly- reaching out to your doctor early when you have concerns can prevent you from having to go to the hospital Please take every effort and precaution to keep your left foot clean and dry and follow the instructions for wound care that your surgeon (Dr. Malvin) provided  Plan:  Telephone follow up appointment with care management team member scheduled for:  Thursday 03/17/24        The patient verbalized understanding of instructions,  educational materials, and care plan provided today and DECLINED offer to receive copy of patient instructions, educational materials, and care plan.   If you are experiencing a Mental Health or Behavioral Health Crisis or need someone to talk to, please  call the Suicide and  Crisis Lifeline: 988 call the [place]  National Suicide Prevention Lifeline: 719 722 4800 or TTY: (678)816-1886 TTY 254-833-5567) to talk to a trained counselor call 1-800-273-TALK (toll free, 24 hour hotline) go to [facility] [address] [place] [phone]) call the [facility] Crisis Line: 332-277-4865 call 911   Beatris Blinda Lawrence, RN, BSN, CCRN Alumnus RN Care Manager  Transitions of Care  VBCI - Population Health  [place] [phone]: direct office

## 2024-03-08 NOTE — Transitions of Care (Post Inpatient/ED Visit) (Signed)
 03/08/2024  Name: [name] MRN: [id number] DOB: 10-Sep-1955  Today's TOC FU Call Status: Today's TOC FU Call Status:: Successful TOC FU Call Completed TOC FU Call Complete Date: 03/08/24 Patient's Name and Date of Birth confirmed.  Transition Care Management Follow-up Telephone Call Date of Discharge: 03/04/24 Discharge Facility: [facility]) Type of Discharge: Inpatient Admission Primary Inpatient Discharge Diagnosis:: Osteomyelitis- necrotic (L) great toe with surgical amputation; non-compliance with diabetes; multiple SDOH needs How have you been since you were released from the hospital?: Better (I am managing; the social worker calls me every day: Chrystine; I don't need any other social work calling me- she is working with DSS to try to get this big mess I am in under control.  I am not checking my blood sugars but I am taking the insulin ) Any questions or concerns?: Yes Patient Questions/Concerns:: Multiple SDOH needs at baseline- patient reports SDOH needs have been being addressed by currently established LCSW Chrystine; who is working with DSS to address multiple SDOH needs which have been well-documented x several months Patient Questions/Concerns Addressed: Other: (Care coordination outreach with established LCSW; sent secure chat message to patient's established surgical podiatry provider- whom patient has appointment with today, to make surgeon aware of ongoing concerns; facilitated scheduling PCP HFU OV)  Items Reviewed: Did you receive and understand the discharge instructions provided?: Yes (thoroughly reviewed with patient who verbalizes fair understanding of same - required much repetition and reinforcement throughout TOC call) Medications obtained,verified, and reconciled?: Partial Review Completed (Partial medication review completed; confirmed per patient report- obtained/ is taking all newly Rx'd medications as instructed; reports self-manages  medications) Reason for Partial Mediation Review: Patient declined: he is currently at podiatry office, awaiting scheduled hospital follow up appointment this afternoon- patient reports he has completed antibiotics as prescribed at time of hospital discharge: reports took as instructed; confirms he is taking long acting insulin  35 U QD as prescribed: currently storing on ice in cooler: provided education around need to ensure ice is fresh at all times- patient reports he is currently doing that Any new allergies since your discharge?: No Dietary orders reviewed?: Yes Type of Diet Ordered:: I eat whatever I have available; mostly go out for fast food, because I don't have any power, so I eat every meal out Do you have support at home?: Yes People in Home [RPT]: alone Name of Support/Comfort Primary Source: Reports independent in self-care activities; supportive neighbors assists as/ if needed/ indicated  Medications Reviewed Today: Medications Reviewed Today     Reviewed by Rozella Servello M, RN (Registered Nurse) on 03/08/24 at 1618  Med List Status: <None>   Medication Order Taking? Sig Documenting Provider Last Dose Status Informant  amoxicillin -clavulanate (AUGMENTIN ) 875-125 MG tablet [id number]  Take 1 tablet by mouth 2 (two) times daily. Malvin Marsa FALCON, [place]  Active   aspirin  EC 81 MG tablet [id number]  Take 1 tablet (81 mg total) by mouth daily. Swallow whole. Charlyne Reed, PA-C  Active Self, Pharmacy Records  atorvastatin  (LIPITOR ) 40 MG tablet [id number]  TAKE 1 TABLET BY MOUTH ONCE DAILY Norleen Lynwood LELON, MD  Active Self, Pharmacy Records  Blood Glucose Monitoring Suppl (ONE TOUCH ULTRA 2) w/Device PRESSLEY [id number]  Use as directed to check blood sugars twice a day Norleen Lynwood LELON, MD  Active Self, Pharmacy Records  citalopram  (CELEXA ) 20 MG tablet [id number]  Take 1 tablet (20 mg total) by mouth daily. Norleen Lynwood LELON, MD  Active Self, Pharmacy Records  clopidogrel  (  PLAVIX ) 75 MG tablet  [id number]  Take 1 tablet (75 mg total) by mouth daily. Norleen Lynwood ORN, MD  Active Self, Pharmacy Records  insulin  glargine, 2 Unit Dial , (TOUJEO  MAX SOLOSTAR) 300 UNIT/ML Solostar Pen [id number] Yes Inject 35 Units into the skin every morning. Vernon Ranks, MD  Active   Insulin  Pen Needle 32G X 4 MM MISC [id number]  Use with insulin  pens. Vernon Ranks, MD  Active   irbesartan  (AVAPRO ) 300 MG tablet [id number]  Take 1 tablet (300 mg total) by mouth daily. Norleen Lynwood ORN, MD  Active Self, Pharmacy Records  Lancets [facility] CAT[facility] PLUS [place]) [place] [id number]  CHECK BLOOD SUGAR TWICE DAILY Norleen Lynwood ORN, MD  Active Self, Pharmacy Records  loratadine  (CLARITIN ) 10 MG tablet [id number]  Take 10 mg by mouth daily as needed. [provider]  Active Self, Pharmacy Records  [facility] ULTRA test strip [id number]  CHECK BLOOD SUGAR TWICE DAILY Norleen Lynwood ORN, MD  Active Self, Pharmacy Records  pantoprazole  (PROTONIX ) 40 MG tablet [id number]  Take 1 tablet (40 mg total) by mouth daily. Norleen Lynwood ORN, MD  Active Self, Pharmacy Records           Home Care and Equipment/Supplies: Were Home Health Services Ordered?: No Any new equipment or medical supplies ordered?: No  Functional Questionnaire: Do you need assistance with bathing/showering or dressing?: No (Reports independent in hygiene: but has not had running water at home x last 4 months; reports uses collected rain water to take a bath, sometimes go to neighbors house to shower  Confirmed established LCSW active, working with DSS) Do you need assistance with meal preparation?: No Do you need assistance with eating?: No Do you have difficulty maintaining continence: No (uses urinal and BSC for all toileting needs: does not have running water utility: reports I take 5 gallons of water and manually pour it into the commode and flush it out manually once the bedside commode and urinal gets full  established LCSW active) Do you need assistance with  getting out of bed/getting out of a chair/moving?: No Do you have difficulty managing or taking your medications?: No (Reports now keeping insulin  in cooler on fresh ice; reports taking insulin  as prescribed as of today)  Follow up appointments reviewed: PCP Follow-up appointment confirmed?: Yes (care coordination outreach in real-time with scheduling care guide to successfully schedule hospital follow up PCP appointment 03/15/24) Date of PCP follow-up appointment?: 03/15/24 Follow-up Provider: PCP- Dr. Norleen Specialist [facility] Follow-up appointment confirmed?: Yes Date of Specialist follow-up appointment?: 03/08/24 Follow-Up Specialty Provider:: Surgical podiatry provider Do you need transportation to your follow-up appointment?: No Do you understand care options if your condition(s) worsen?: Yes-patient verbalized understanding  SDOH Interventions Today    Flowsheet Row Most Recent Value  SDOH Interventions   Food Insecurity Interventions Other (Comment)  [Patient reports he is currently established with cardiology LCSW, who calls me every day,  and works with Hadassah at [employer],  he declines need for additional LCSW intervention: they are working on getting things in place but it sure is taking a long time]  Housing Interventions Other (Comment)  [Patient reports he is currently established with cardiology LCSW, who calls me every day,  and works with Hadassah at [employer],  he declines need for additional LCSW intervention: they are working on getting things in place but it sure is taking a long time]  Transportation Interventions Intervention Not Indicated  [Rerports driving self today: denies transportation issues: states I finally got my  tags]  Utilities Interventions Other (Comment)  [Patient reports he is currently established with cardiology LCSW, who calls me every day,  and works with Hadassah at [employer],  he declines need for additional LCSW intervention: they are working on getting  things in place but it sure is taking a long time]   See TOC assessment tabs for additional assessment/ TOC intervention information  Successfully enrolled into 30-day TOC program  Pls call/ message for questions,  Kennidi Yoshida Mckinney Yazen Rosko, RN, BSN, Media planner  Transitions of Care  [facility] - [facility] [phone]: direct office

## 2024-03-08 NOTE — ED Triage Notes (Signed)
 Pt bib [facility] from home with complaints of shob that started at 1300 today while he was going into an appointment. Pt called EMS tonight due to not being able to catch his breath. EMS reports sats 91% on room air and CBG 309. Pt has been unable to take home meds today and does not normally wear oxygen at home. Had surgery on left foot for toe amputation 4 days ago.

## 2024-03-09 ENCOUNTER — Ambulatory Visit: Admitting: Internal Medicine

## 2024-03-09 ENCOUNTER — Emergency Department (HOSPITAL_COMMUNITY)

## 2024-03-09 ENCOUNTER — Encounter (HOSPITAL_COMMUNITY): Payer: Self-pay | Admitting: Internal Medicine

## 2024-03-09 DIAGNOSIS — F17290 Nicotine dependence, other tobacco product, uncomplicated: Secondary | ICD-10-CM | POA: Diagnosis present

## 2024-03-09 DIAGNOSIS — J9601 Acute respiratory failure with hypoxia: Secondary | ICD-10-CM | POA: Diagnosis present

## 2024-03-09 DIAGNOSIS — Z794 Long term (current) use of insulin: Secondary | ICD-10-CM | POA: Diagnosis not present

## 2024-03-09 DIAGNOSIS — L02612 Cutaneous abscess of left foot: Secondary | ICD-10-CM | POA: Diagnosis present

## 2024-03-09 DIAGNOSIS — R0902 Hypoxemia: Secondary | ICD-10-CM | POA: Diagnosis not present

## 2024-03-09 DIAGNOSIS — Z7902 Long term (current) use of antithrombotics/antiplatelets: Secondary | ICD-10-CM | POA: Diagnosis not present

## 2024-03-09 DIAGNOSIS — Z1152 Encounter for screening for COVID-19: Secondary | ICD-10-CM | POA: Diagnosis not present

## 2024-03-09 DIAGNOSIS — I10 Essential (primary) hypertension: Secondary | ICD-10-CM | POA: Diagnosis present

## 2024-03-09 DIAGNOSIS — E1165 Type 2 diabetes mellitus with hyperglycemia: Secondary | ICD-10-CM | POA: Diagnosis present

## 2024-03-09 DIAGNOSIS — Z833 Family history of diabetes mellitus: Secondary | ICD-10-CM | POA: Diagnosis not present

## 2024-03-09 DIAGNOSIS — R0602 Shortness of breath: Secondary | ICD-10-CM | POA: Diagnosis not present

## 2024-03-09 DIAGNOSIS — E871 Hypo-osmolality and hyponatremia: Secondary | ICD-10-CM | POA: Diagnosis present

## 2024-03-09 DIAGNOSIS — I700 Atherosclerosis of aorta: Secondary | ICD-10-CM | POA: Diagnosis not present

## 2024-03-09 DIAGNOSIS — J439 Emphysema, unspecified: Secondary | ICD-10-CM | POA: Diagnosis not present

## 2024-03-09 DIAGNOSIS — M869 Osteomyelitis, unspecified: Secondary | ICD-10-CM | POA: Diagnosis present

## 2024-03-09 DIAGNOSIS — K219 Gastro-esophageal reflux disease without esophagitis: Secondary | ICD-10-CM | POA: Diagnosis present

## 2024-03-09 DIAGNOSIS — B348 Other viral infections of unspecified site: Secondary | ICD-10-CM | POA: Diagnosis present

## 2024-03-09 DIAGNOSIS — Z801 Family history of malignant neoplasm of trachea, bronchus and lung: Secondary | ICD-10-CM | POA: Diagnosis not present

## 2024-03-09 DIAGNOSIS — Z7982 Long term (current) use of aspirin: Secondary | ICD-10-CM | POA: Diagnosis not present

## 2024-03-09 DIAGNOSIS — I2510 Atherosclerotic heart disease of native coronary artery without angina pectoris: Secondary | ICD-10-CM | POA: Diagnosis present

## 2024-03-09 DIAGNOSIS — Z860101 Personal history of adenomatous and serrated colon polyps: Secondary | ICD-10-CM | POA: Diagnosis not present

## 2024-03-09 DIAGNOSIS — Z8551 Personal history of malignant neoplasm of bladder: Secondary | ICD-10-CM | POA: Diagnosis not present

## 2024-03-09 DIAGNOSIS — E785 Hyperlipidemia, unspecified: Secondary | ICD-10-CM | POA: Diagnosis present

## 2024-03-09 DIAGNOSIS — L03032 Cellulitis of left toe: Secondary | ICD-10-CM | POA: Diagnosis present

## 2024-03-09 DIAGNOSIS — Z8249 Family history of ischemic heart disease and other diseases of the circulatory system: Secondary | ICD-10-CM | POA: Diagnosis not present

## 2024-03-09 DIAGNOSIS — I480 Paroxysmal atrial fibrillation: Secondary | ICD-10-CM | POA: Diagnosis present

## 2024-03-09 LAB — CBC WITH DIFFERENTIAL/PLATELET
Abs Immature Granulocytes: 0.03 K/uL (ref 0.00–0.07)
Basophils Absolute: 0.1 K/uL (ref 0.0–0.1)
Basophils Relative: 1 %
Eosinophils Absolute: 0 K/uL (ref 0.0–0.5)
Eosinophils Relative: 0 %
HCT: 36.9 % — ABNORMAL LOW (ref 39.0–52.0)
Hemoglobin: 13.1 g/dL (ref 13.0–17.0)
Immature Granulocytes: 0 %
Lymphocytes Relative: 15 %
Lymphs Abs: 1.2 K/uL (ref 0.7–4.0)
MCH: 32.5 pg (ref 26.0–34.0)
MCHC: 35.5 g/dL (ref 30.0–36.0)
MCV: 91.6 fL (ref 80.0–100.0)
Monocytes Absolute: 1.2 K/uL — ABNORMAL HIGH (ref 0.1–1.0)
Monocytes Relative: 15 %
Neutro Abs: 5.2 K/uL (ref 1.7–7.7)
Neutrophils Relative %: 69 %
Platelets: 280 K/uL (ref 150–400)
RBC: 4.03 MIL/uL — ABNORMAL LOW (ref 4.22–5.81)
RDW: 12.3 % (ref 11.5–15.5)
WBC: 7.7 K/uL (ref 4.0–10.5)
nRBC: 0 % (ref 0.0–0.2)

## 2024-03-09 LAB — BASIC METABOLIC PANEL WITH GFR
Anion gap: 9 (ref 5–15)
BUN: 8 mg/dL (ref 8–23)
CO2: 24 mmol/L (ref 22–32)
Calcium: 8.5 mg/dL — ABNORMAL LOW (ref 8.9–10.3)
Chloride: 90 mmol/L — ABNORMAL LOW (ref 98–111)
Creatinine, Ser: 0.73 mg/dL (ref 0.61–1.24)
GFR, Estimated: >60 mL/min (ref >60)
Glucose, Bld: 278 mg/dL — ABNORMAL HIGH (ref 70–99)
Potassium: 3.8 mmol/L (ref 3.5–5.1)
Sodium: 123 mmol/L — ABNORMAL LOW (ref 135–145)

## 2024-03-09 LAB — RESPIRATORY PANEL BY PCR

## 2024-03-09 LAB — CBC
HCT: 36.5 % — ABNORMAL LOW (ref 39.0–52.0)
Hemoglobin: 12.7 g/dL — ABNORMAL LOW (ref 13.0–17.0)
MCH: 32.5 pg (ref 26.0–34.0)
MCHC: 34.8 g/dL (ref 30.0–36.0)
MCV: 93.4 fL (ref 80.0–100.0)
Platelets: 273 K/uL (ref 150–400)
RBC: 3.91 MIL/uL — ABNORMAL LOW (ref 4.22–5.81)
RDW: 12.3 % (ref 11.5–15.5)
WBC: 9.9 K/uL (ref 4.0–10.5)
nRBC: 0 % (ref 0.0–0.2)

## 2024-03-09 LAB — T4, FREE: Free T4: 1.41 ng/dL — ABNORMAL HIGH (ref 0.61–1.12)

## 2024-03-09 LAB — COMPREHENSIVE METABOLIC PANEL WITH GFR
ALT: 18 U/L (ref 0–44)
AST: 20 U/L (ref 15–41)
Albumin: 2.3 g/dL — ABNORMAL LOW (ref 3.5–5.0)
Alkaline Phosphatase: 72 U/L (ref 38–126)
Anion gap: 12 (ref 5–15)
BUN: 7 mg/dL — ABNORMAL LOW (ref 8–23)
CO2: 23 mmol/L (ref 22–32)
Calcium: 8.7 mg/dL — ABNORMAL LOW (ref 8.9–10.3)
Chloride: 95 mmol/L — ABNORMAL LOW (ref 98–111)
Creatinine, Ser: 0.69 mg/dL (ref 0.61–1.24)
GFR, Estimated: >60 mL/min (ref >60)
Glucose, Bld: 174 mg/dL — ABNORMAL HIGH (ref 70–99)
Potassium: 3.8 mmol/L (ref 3.5–5.1)
Sodium: 130 mmol/L — ABNORMAL LOW (ref 135–145)
Total Bilirubin: 0.5 mg/dL (ref 0.0–1.2)
Total Protein: 6.6 g/dL (ref 6.5–8.1)

## 2024-03-09 LAB — GLUCOSE, CAPILLARY
Glucose-Capillary: 187 mg/dL — ABNORMAL HIGH (ref 70–99)
Glucose-Capillary: 294 mg/dL — ABNORMAL HIGH (ref 70–99)
Glucose-Capillary: 247 mg/dL — ABNORMAL HIGH (ref 70–99)
Glucose-Capillary: 194 mg/dL — ABNORMAL HIGH (ref 70–99)

## 2024-03-09 LAB — TROPONIN I (HIGH SENSITIVITY)
Troponin I (High Sensitivity): 51 ng/L — ABNORMAL HIGH (ref <18)
Troponin I (High Sensitivity): 53 ng/L — ABNORMAL HIGH (ref <18)

## 2024-03-09 LAB — RESP PANEL BY RT-PCR (RSV, FLU A&B, COVID)  RVPGX2
Influenza A by PCR: NEGATIVE
Influenza B by PCR: NEGATIVE
Resp Syncytial Virus by PCR: NEGATIVE
SARS Coronavirus 2 by RT PCR: NEGATIVE

## 2024-03-09 LAB — I-STAT CG4 LACTIC ACID, ED
Lactic Acid, Venous: 0.9 mmol/L (ref 0.5–1.9)
Lactic Acid, Venous: 1 mmol/L (ref 0.5–1.9)

## 2024-03-09 LAB — NA AND K (SODIUM & POTASSIUM), RAND UR
Potassium Urine: 14 mmol/L
Sodium, Ur: 26 mmol/L

## 2024-03-09 LAB — OSMOLALITY: Osmolality: 274 mosm/kg — ABNORMAL LOW (ref 275–295)

## 2024-03-09 LAB — SODIUM, URINE, RANDOM: Sodium, Ur: 26 mmol/L

## 2024-03-09 LAB — TSH: TSH: 1.26 u[IU]/mL (ref 0.350–4.500)

## 2024-03-09 LAB — BRAIN NATRIURETIC PEPTIDE: B Natriuretic Peptide: 269.2 pg/mL — ABNORMAL HIGH (ref 0.0–100.0)

## 2024-03-09 LAB — OSMOLALITY, URINE: Osmolality, Ur: 211 mosm/kg — ABNORMAL LOW (ref 300–900)

## 2024-03-09 MED ORDER — CLOPIDOGREL BISULFATE 75 MG PO TABS
75.0000 mg | ORAL_TABLET | Freq: Every day | ORAL | Status: DC
  Administered 2024-03-09 – 2024-03-10 (×2): 75 mg via ORAL
  Filled 2024-03-09 (×2): qty 1

## 2024-03-09 MED ORDER — IPRATROPIUM-ALBUTEROL 0.5-2.5 (3) MG/3ML IN SOLN
3.0000 mL | Freq: Three times a day (TID) | RESPIRATORY_TRACT | Status: DC
  Administered 2024-03-09 – 2024-03-10 (×3): 3 mL via RESPIRATORY_TRACT
  Filled 2024-03-09 (×3): qty 3

## 2024-03-09 MED ORDER — ALBUTEROL SULFATE (2.5 MG/3ML) 0.083% IN NEBU: 2.5000 mg | INHALATION_SOLUTION | RESPIRATORY_TRACT | Status: DC | PRN

## 2024-03-09 MED ORDER — INSULIN ASPART 100 UNIT/ML IJ SOLN: 0.0000 [IU] | Freq: Every day | INTRAMUSCULAR | Status: DC

## 2024-03-09 MED ORDER — SODIUM CHLORIDE 0.9 % IV SOLN: INTRAVENOUS | Status: AC

## 2024-03-09 MED ORDER — IOHEXOL 350 MG/ML SOLN
75.0000 mL | Freq: Once | INTRAVENOUS | Status: AC | PRN
  Administered 2024-03-09: 75 mL via INTRAVENOUS

## 2024-03-09 MED ORDER — CITALOPRAM HYDROBROMIDE 20 MG PO TABS
20.0000 mg | ORAL_TABLET | Freq: Every day | ORAL | Status: DC
  Administered 2024-03-09 – 2024-03-10 (×2): 20 mg via ORAL
  Filled 2024-03-09 (×2): qty 1

## 2024-03-09 MED ORDER — INSULIN GLARGINE-YFGN 100 UNIT/ML ~~LOC~~ SOLN
25.0000 [IU] | Freq: Every day | SUBCUTANEOUS | Status: DC
  Administered 2024-03-10: 25 [IU] via SUBCUTANEOUS
  Filled 2024-03-09: qty 0.25

## 2024-03-09 MED ORDER — ASPIRIN 81 MG PO TBEC
81.0000 mg | DELAYED_RELEASE_TABLET | Freq: Every day | ORAL | Status: DC
  Administered 2024-03-09 – 2024-03-10 (×2): 81 mg via ORAL
  Filled 2024-03-09 (×2): qty 1

## 2024-03-09 MED ORDER — IPRATROPIUM-ALBUTEROL 0.5-2.5 (3) MG/3ML IN SOLN
3.0000 mL | Freq: Four times a day (QID) | RESPIRATORY_TRACT | Status: DC
  Administered 2024-03-09: 3 mL via RESPIRATORY_TRACT
  Filled 2024-03-09: qty 3

## 2024-03-09 MED ORDER — AMOXICILLIN-POT CLAVULANATE 875-125 MG PO TABS
1.0000 | ORAL_TABLET | Freq: Two times a day (BID) | ORAL | Status: DC
  Administered 2024-03-09 – 2024-03-10 (×3): 1 via ORAL
  Filled 2024-03-09 (×3): qty 1

## 2024-03-09 MED ORDER — ENOXAPARIN SODIUM 40 MG/0.4ML IJ SOSY
40.0000 mg | PREFILLED_SYRINGE | INTRAMUSCULAR | Status: DC
  Administered 2024-03-09 – 2024-03-10 (×2): 40 mg via SUBCUTANEOUS
  Filled 2024-03-09 (×2): qty 0.4

## 2024-03-09 MED ORDER — INSULIN ASPART 100 UNIT/ML IJ SOLN
0.0000 [IU] | Freq: Three times a day (TID) | INTRAMUSCULAR | Status: DC
  Administered 2024-03-09: 3 [IU] via SUBCUTANEOUS
  Administered 2024-03-09 – 2024-03-10 (×2): 5 [IU] via SUBCUTANEOUS

## 2024-03-09 MED ORDER — ATORVASTATIN CALCIUM 40 MG PO TABS
40.0000 mg | ORAL_TABLET | Freq: Every day | ORAL | Status: DC
  Administered 2024-03-09 – 2024-03-10 (×2): 40 mg via ORAL
  Filled 2024-03-09 (×2): qty 1

## 2024-03-09 MED ORDER — INSULIN GLARGINE 100 UNIT/ML ~~LOC~~ SOLN
25.0000 [IU] | Freq: Every day | SUBCUTANEOUS | Status: DC
  Administered 2024-03-09: 25 [IU] via SUBCUTANEOUS
  Filled 2024-03-09: qty 0.25

## 2024-03-09 MED ORDER — PANTOPRAZOLE SODIUM 40 MG PO TBEC
40.0000 mg | DELAYED_RELEASE_TABLET | Freq: Every day | ORAL | Status: DC
  Administered 2024-03-09 – 2024-03-10 (×2): 40 mg via ORAL
  Filled 2024-03-09 (×2): qty 1

## 2024-03-09 MED ORDER — OXYCODONE HCL 5 MG PO TABS
5.0000 mg | ORAL_TABLET | Freq: Once | ORAL | Status: AC
  Administered 2024-03-09: 5 mg via ORAL
  Filled 2024-03-09: qty 1

## 2024-03-09 NOTE — Progress Notes (Signed)
 Transition of Care [facility]) - Inpatient Brief Assessment   Patient Details  Name: [name] MRN: [id number] Date of Birth: 17-Apr-1956  [facility] CM/SW Contact:    Rosaline JONELLE Joe, RN Phone Number: 03/09/2024, 3:23 PM   Clinical Narrative: CM met with the patient at the bedside to discuss TOC needs.  Patient  states that he lives in a trailer with no electricity nor running water since May 2025.  Patient states that he has been staying evenings at his neighbor's trailer.  Patient states that he has had financial constraints since his wife's death and is now involved with [employer] DSS to have them assume financial responsibility for paying his bills.  I called Hadassah Fila, [place] DSS social worker and she states that DSS will likely be his payee in the next 30 days - but in the meantime - patient will be in control of his own bills.  Patient has a cell phone that is in working order at this time.  Fila is aware that patient has no utilities and do not have emergency funds to cover the cost to have the utilities shut back on at this time.  I discussed this with the patient.  He is ambulatory and states that he plans to stay with his neighbor in the evenings and declines SNF placement.  Patient had recent foot surgery and toe amputation and has been washing his foot post-op with rain-water barrel in the yard per podiatry notes.  Patient plans to store his insulin  at the neighbor's home when he returns home.  Patient has no DME at the home and normally uses no DMe to ambulate.  Patient is currently on 4L/min [place] oxygen at the bedside due to pulmonary edema and respiratory failure upon admission to the hospital.  Patient is a daily smoker for the past 45 years and continues smoking at this time. - resources will be provided in the AVS for cessation.  I updated the MD concerning patient's social issues at the home and that [facility] DSS will continue  to follow the patient in the community when he discharges home at a later date when medically stable.   Transition of Care Asessment: Insurance and Status: (P) Insurance coverage has been reviewed Patient has primary care physician: (P) Yes Home environment has been reviewed: (P) from trailer  - home alone with electricity Prior level of function:: (P) Independent Prior/Current Home Services: (P) No current home services Social Drivers of Health Review: (P) SDOH reviewed needs interventions Readmission risk has been reviewed: (P) Yes Transition of care needs: (P) transition of care needs identified, TOC will continue to follow

## 2024-03-09 NOTE — Plan of Care (Signed)

## 2024-03-09 NOTE — Inpatient Diabetes Management (Signed)
 Inpatient Diabetes Program Recommendations  AACE/ADA: New Consensus Statement on Inpatient Glycemic Control (2025)  Target Ranges:  Prepandial:   less than 140 mg/dL      Peak postprandial:   less than 180 mg/dL (1-2 hours)      Critically ill patients:  140 - 180 mg/dL   Lab Results  Component Value Date   GLUCAP 294 (H) 03/09/2024   HGBA1C 11.2 (H) 02/22/2024    Review of Glycemic Control  Latest Reference Range & Units 03/09/24 09:07 03/09/24 12:38  Glucose-Capillary 70 - 99 mg/dL 812 (H) 705 (H)   Diabetes history: DM  Outpatient Diabetes medications:  Toujeo  35 units q AM Current orders for Inpatient glycemic control:  Novolog  0-9 units tid with meals and HS Lantus  25 units daily  Inpatient Diabetes Program Recommendations:    Consider adding Novolog  3 units tid with meals (hold if patient eats less than 50% or NPO).   Thanks,  Randall Bullocks, RN, BC-ADM Inpatient Diabetes Coordinator Pager [phone]  (8a-5p)

## 2024-03-09 NOTE — Evaluation (Signed)
 Occupational Therapy Evaluation Patient Details Name: [name] MRN: [id number] DOB: 1955/11/04 Today's Date: 03/09/2024   History of Present Illness   68 yo male arrives to [facility] ED on 03/08/24 for SOB and feeling like he has pneumonia. Recently has amputations of L Great toe on 7/21 and L 5th toe 5/24. PMH: CAD, PAD s/p stent, diabetes, diabetic foot infection, hx of cancer, GERD     Clinical Impressions PTA, pt lived alone and reports being independent. Upon eval, pt performing UB ADL with set-up and LB ADL with up to CGA. Pt with reduced cardiopulmonary status, activity tolerance, and strength, thus will follow acutely, but do not anticipate need for continued OT services at discharge. Pt with questionable health literacy reporting he chooses not to have power and running water and does not demo any concerns for his current foot healing. Pt ambulatory in room with RW for short distance without supplemental O2 desatting to 88% and needing up to 3L to maintain >93.        If plan is discharge home, recommend the following:   A little help with walking and/or transfers;A little help with bathing/dressing/bathroom;Assist for transportation;Help with stairs or ramp for entrance;Assistance with cooking/housework     Functional Status Assessment   Patient has had a recent decline in their functional status and demonstrates the ability to make significant improvements in function in a reasonable and predictable amount of time.     Equipment Recommendations   None recommended by OT     Recommendations for Other Services         Precautions/Restrictions   Precautions Precautions: Fall;Other (comment) Recall of Precautions/Restrictions: Intact Precaution/Restrictions Comments: Post-op shoe on L foot Required Braces or Orthoses: Other Brace Other Brace: Post-op shoe Restrictions Weight Bearing Restrictions Per Provider Order: Yes LLE Weight Bearing Per Provider Order:  Weight bearing as tolerated     Mobility Bed Mobility Overal bed mobility: Needs Assistance Bed Mobility: Supine to Sit, Sit to Supine     Supine to sit: Modified independent (Device/Increase time) Sit to supine: Modified independent (Device/Increase time)        Transfers Overall transfer level: Needs assistance Equipment used: Rolling walker (2 wheels) Transfers: Sit to/from Stand Sit to Stand: Contact guard assist           General transfer comment: for safety      Balance Overall balance assessment: Needs assistance Sitting-balance support: Feet supported, Single extremity supported Sitting balance-Leahy Scale: Good       Standing balance-Leahy Scale: Fair Standing balance comment: Requires use of RW for steadying                           ADL either performed or assessed with clinical judgement   ADL Overall ADL's : Needs assistance/impaired Eating/Feeding: Independent   Grooming: Supervision/safety;Standing   Upper Body Bathing: Set up;Sitting   Lower Body Bathing: Contact guard assist;Sit to/from stand   Upper Body Dressing : Set up;Sitting   Lower Body Dressing: Contact guard assist;Sit to/from stand   Toilet Transfer: Contact guard assist;Rolling walker (2 wheels);Ambulation           Functional mobility during ADLs: Contact guard assist;Rolling walker (2 wheels)       Vision         Perception         Praxis         Pertinent Vitals/Pain Pain Assessment Pain Assessment: Faces Faces Pain Scale: Hurts  a little bit Pain Location: L Foot Pain Descriptors / Indicators: Aching Pain Intervention(s): Limited activity within patient's tolerance, Monitored during session     Extremity/Trunk Assessment Upper Extremity Assessment Upper Extremity Assessment: Overall WFL for tasks assessed   Lower Extremity Assessment Lower Extremity Assessment: Defer to PT evaluation LLE Deficits / Details: s/p L great toe amp    Cervical / Trunk Assessment Cervical / Trunk Assessment: Normal   Communication Communication Communication: No apparent difficulties   Cognition Arousal: Alert Behavior During Therapy: WFL for tasks assessed/performed Cognition: Difficult to assess             OT - Cognition Comments: masks cognitive deficits with humor/distraction vs does not have any deficits but perhaps poor health literacy. Pt chooses not to pay for electricity and reports he bathes when he has to in the pond.                 Following commands: Intact       Cueing  General Comments   Cueing Techniques: Verbal cues;Gestural cues  SpO2 dropping down to 88 with mobility without O2 donned ~10 ft   Exercises     Shoulder Instructions      Home Living Family/patient expects to be discharged to:: Private residence Living Arrangements: Alone Available Help at Discharge: Neighbor;Other (Comment) (inconsistent story who would be available at dc) Type of Home: Mobile home Home Access: Ramped entrance     Home Layout: One level     Bathroom Shower/Tub: Other (comment) (bucket bath; has well water but chooses not to pay for electricity. gets water from pond)   Firefighter: Standard     Home Equipment: Agricultural consultant (2 wheels);Wheelchair - manual;Cane - single point          Prior Functioning/Environment Prior Level of Function : Independent/Modified Independent;Driving                    OT Problem List: Decreased strength;Decreased activity tolerance;Impaired balance (sitting and/or standing)   OT Treatment/Interventions: Self-care/ADL training;Therapeutic exercise;DME and/or AE instruction;Therapeutic activities;Patient/family education;Balance training      OT Goals(Current goals can be found in the care plan section)   Acute Rehab OT Goals Patient Stated Goal: breathe better OT Goal Formulation: With patient Time For Goal Achievement: 03/23/24 Potential to  Achieve Goals: Good   OT Frequency:  Min 2X/week    Co-evaluation              AM-PAC OT 6 Clicks Daily Activity     Outcome Measure Help from another person eating meals?: None Help from another person taking care of personal grooming?: A Little Help from another person toileting, which includes using toliet, bedpan, or urinal?: A Little Help from another person bathing (including washing, rinsing, drying)?: A Little Help from another person to put on and taking off regular upper body clothing?: A Little Help from another person to put on and taking off regular lower body clothing?: A Little 6 Click Score: 19   End of Session Equipment Utilized During Treatment: Gait belt;Rolling walker (2 wheels) Nurse Communication: Mobility status  Activity Tolerance: Patient tolerated treatment well Patient left: in bed;with call bell/phone within reach;with nursing/sitter in room  OT Visit Diagnosis: Unsteadiness on feet (R26.81);Muscle weakness (generalized) (M62.81)                Time: 8469-8391 OT Time Calculation (min): 38 min Charges:  OT General Charges $OT Visit: 1 Visit OT Evaluation $OT Eval Low  Complexity: 1 Low OT Treatments $Self Care/Home Management : 23-37 mins  Elma JONETTA Lebron FREDERICK, OTR/L [facility] Acute Rehabilitation Office: [phone]   Elma JONETTA Lebron 03/09/2024, 5:14 PM

## 2024-03-09 NOTE — H&P (Addendum)
 History and Physical    [name] FMW:[id number] DOB: 1955/11/19 DOA: 03/08/2024  PCP: Norleen Lynwood LELON, MD  Patient coming from: home  I have personally briefly reviewed patient's old medical records in [name]  Chief Complaint: shortness of breath, feels like he has pneumonia  HPI: [name] is [name] 68 y.o. male with medical history significant of CAD, PAD s/p stent, diabetes, diabetic foot infection and multiple other medical issues here with concern for pneumonia.  He notes that his symptoms started yesterday.  He felt [name] thickness in his chest and felt like he could not breathe.  He notes [name] cough productive of white sputum.  Describes chest pain with cough and shortness of breath with movement.  He notes he feels like he has pneumonia.  He describes subjective fever yesterday.  Notes chest pain with cough.  No abdominal pain, nausea, vomiting, numbness, tingling, weakness.  Smokes cigars.  No alcohol.  ED Course: Labs, imaging.  Parainfluenza virus on RVP.  Carryover admission.   Review of Systems: As per HPI otherwise all other systems reviewed and are negative.  Past Medical History:  Diagnosis Date   Adenomatous colon polyp    Allergic rhinitis    At risk for sleep apnea    STOP-BANG= 5   SENT TO PCP 03-14-2014   Bladder cancer (HCC)    CAD (coronary artery disease)    [name]. 07/2014 low risk MV; b. 07/2019 Cor CTA (FFR): LM 25-49 (nl), LAD mild prox/mid plaque (nl), D1 25-49p(nl w/ abnl FFR of 0.73 in inf branch), LCX/OM1 mild prox/mid plaque (nl), RCA nondominant, minimal Ca2+ plaque (nl), RPDA (mildly abnl @ 0.79)-->Med Rx..   Cataract    surgically removed bilateral   Condyloma acuminatum of penis    Diabetic neuropathy (HCC)    Diastolic dysfunction    [name]. 05/2019 Echo: EF 60-65%, no rwam, mod LVH, impaired relaxation, nl RV size/fxn, trace MR, triv TR.   Diverticulosis    GERD (gastroesophageal reflux disease)    History of bladder cancer    s/p  turbt  2013/    transitional cell carcinoma--    History of condyloma acuminatum    PERINEAL AREA  W/ RECURRENCY   History of gout    Hyperlipidemia    Hypertension    Hypoxemia 03/09/2024   Lower urinary tract symptoms (LUTS)    PAF (paroxysmal atrial fibrillation) (HCC)    [name]. 06/2019 Event monitor: PAF <1% burden. Longest 3 mins 36 secs.   Productive cough    PSVT (paroxysmal supraventricular tachycardia) (HCC)    [name]. 06/2019 Event monitor: 112 episodes of SVT, longest 21 secs.   PVD (peripheral vascular disease) with claudication (HCC)    [name]. 03/2014 LE art duplex: long segment occlusion of mid to distal R SFA; b. 03/2020 ABI: nl left and mildly improved R ABI->med rx.   Renal artery stenosis (HCC)    Renal artery stenosis (HCC)    [name]. 12/2020 Renal art duplex: RRA 1-59%, LRA >60%.   Smokers' cough (HCC)    Type 2 diabetes mellitus with insulin  therapy [name]) 1992   monitor by  dr ellsion   Wears dentures    upper    Past Surgical History:  Procedure Laterality Date   ABDOMINAL AORTOGRAM W/LOWER EXTREMITY N/[name] 02/15/2024   Procedure: ABDOMINAL AORTOGRAM W/LOWER EXTREMITY;  Surgeon: Paul Norman RAMAN, MD;  Location: [name];  Service: Cardiovascular;  Laterality: N/[name];   AMPUTATION Left 01/02/2024   Procedure:  AMPUTATION, FOOT, RAY;  Surgeon: [name], DPM;  Location: [facility] OR;  Service: Orthopedics/Podiatry;  Laterality: Left;  FIFTH TOE   AMPUTATION TOE Left 02/29/2024   Procedure: AMPUTATION, LEFT GREAT TOE;  Surgeon: [name], DPM;  Location: [facility] OR;  Service: Orthopedics/Podiatry;  Laterality: Left;  Left Great toe partial amputation,  Debridement Left Heel   AXILLARY HIDRADENITIS EXCISION  1997   CARDIOVASCULAR STRESS TEST  07-24-2014  dr darron grass   Low risk lexiscan  nuclear study with apical thinning and small inferolateral wall infarct at mid & basal level , no ischemia/  normal LVF and wall motion , ef 59%   CATARACT EXTRACTION Left    CATARACT EXTRACTION  W/ INTRAOCULAR LENS IMPLANT Right    CO2 LASER APPLICATION N/[name] 03/20/2014   Procedure: CO2 LASER APPLICATION,PENIS, GROIN, ANUS;  Surgeon: Paul KYM Schultze, MD;  [name];  Service: General;  Laterality: [name];   CO2 LASER APPLICATION N/[name] 05/21/2015   Procedure: CO2 LASER APPLICATION;  Surgeon: Paul Ottelin, MD;  Location: [name] [place];  Service: Urology;  Laterality: N/Jaaliyah Lucatero;   CONDYLOMA EXCISION/FULGURATION [name] 05/21/2015   Procedure: CONDYLOMA REMOVAL;  Surgeon: Paul Ottelin, MD;  Location: [name];  Service: Urology;  Laterality: N/[name];   HEMORRHOID SURGERY  10/24/2014   Procedure: HEMORRHOIDECTOMY;  Surgeon: Paul Schultze, MD;  Location: [name];  Service: General;;   INCISION AND DRAINAGE ABSCESS Left 10/24/2014   Procedure: INCISION AND DRAINAGE ABSCESS;  Surgeon: Paul Schultze, MD;  Location: [name];  Service: General;  Laterality: Left;   INGUINAL HIDRADENITIS EXCISION  1998, 1999   LASER ABLATION CONDOLAMATA N/[name] 03/20/2014   Procedure: EXAM UNDER ANESTHESIA, REMOVAL/ABLATION OF CONDYLOMATA PENIS,GROINS, ANUS, ANAL CANAL;  Surgeon: Paul KYM Schultze, MD;  Location: [name];  Service: General;  Laterality: N/Emiliano Welshans;  groin and anus   LASER ABLATION CONDOLAMATA [name] 10/24/2014   Procedure: LASER ABLATION CONDOLAMATA;  Surgeon: Paul Schultze, MD;  Location: [name] [place];  Service: General;  Laterality: N/[name];   LASER ABLATION OF PENILE AND PERIANAL WARTS  07-29-2007  Dr. Schultze   LEFT SHOULDER SURGERY  2003   LOWER EXTREMITY ANGIOGRAPHY N/[name] 01/01/2024   Procedure: Lower Extremity Angiography;  Surgeon: Sheree Penne Bruckner, MD;  Location: [name];  Service: Vascular;  Laterality: N/Harlee Pursifull;   LOWER EXTREMITY ANGIOGRAPHY N/Gregary Blackard 02/15/2024   Procedure: Lower Extremity Angiography;  Surgeon: Paul Norman RAMAN, MD;  Location: [name];  Service: Cardiovascular;  Laterality:  N/Lucyann Romano;   LOWER EXTREMITY INTERVENTION  01/01/2024   Procedure: LOWER EXTREMITY INTERVENTION;  Surgeon: Sheree Penne Bruckner, MD;  Location: [name] LAB;  Service: Vascular;;   LOWER EXTREMITY INTERVENTION N/Dajae Kizer 02/15/2024   Procedure: LOWER EXTREMITY INTERVENTION;  Surgeon: Paul Norman RAMAN, MD;  Location: [name];  Service: Cardiovascular;  Laterality: N/Aila Terra;   MASS EXCISION N/Amery Vandenbos 10/24/2014   Procedure: EXCISION OF PERINEAL MASS/SINUS;  Surgeon: Paul Schultze, MD;  Location: [facility] [place];  Service: General;  Laterality: N/Inara Dike;   MOHS SURGERY     back   MOHS SURGERY  2017   face   MULTIPLE TOOTH EXTRACTIONS     PERINEAL HIDRADENITIS EXCISION  1998, 1999   TRANSURETHRAL RESECTION OF BLADDER TUMOR  05/21/2012   Procedure: TRANSURETHRAL RESECTION OF BLADDER TUMOR (TURBT);  Surgeon: Paul C Ottelin, MD;  Location: [name];  Service: Urology;  Laterality: N/Mahkayla Preece;  Social History  reports that he has been smoking cigars and cigarettes. He started smoking about 49 years ago. He has Wynell Halberg 41 pack-year smoking history. He has never used smokeless tobacco. He reports that he does not drink alcohol and does not use drugs.  Allergies  Allergen Reactions   Cefepime  Hives    Had allergic reaction to one of these 3 agents, unclear which one   Metoprolol  Hives    Had allergic reaction to one of these 3 agents, unclear which one  *Per RN, highly likely this is the cause of allergic rxn   Myrbetriq  [Mirabegron ] Hives    Had allergic reaction to one of these 3 agents, unclear which one    Family History  Problem Relation Age of Onset   Hypertension Mother    Lung cancer Father    Diabetes Maternal Aunt        x 2   Colon cancer Neg Hx    Esophageal cancer Neg Hx    Pancreatic cancer Neg Hx    Prostate cancer Neg Hx    Kidney disease Neg Hx    Liver disease Neg Hx    Rectal cancer Neg Hx    Stomach cancer Neg Hx      Prior to Admission medications    Medication Sig Start Date End Date Taking? Authorizing Provider  amoxicillin -clavulanate (AUGMENTIN ) 875-125 MG tablet Take 1 tablet by mouth 2 (two) times daily. 03/08/24  Yes Standiford, Marsa [name], DPM  aspirin  EC 81 MG tablet Take 1 tablet (81 mg total) by mouth daily. Swallow whole. 02/17/24  Yes Baglia, Corrina, PA-C  atorvastatin  (LIPITOR ) 40 MG tablet TAKE 1 TABLET BY MOUTH ONCE DAILY 02/22/24  Yes Norleen Lynwood ORN, MD  citalopram  (CELEXA ) 20 MG tablet Take 1 tablet (20 mg total) by mouth daily. 02/22/24  Yes Norleen Lynwood ORN, MD  clopidogrel  (PLAVIX ) 75 MG tablet Take 1 tablet (75 mg total) by mouth daily. 02/22/24 02/21/25 Yes Norleen Lynwood ORN, MD  insulin  glargine, 2 Unit Dial , (TOUJEO  MAX SOLOSTAR) 300 UNIT/ML Solostar Pen Inject 35 Units into the skin every morning. 03/04/24  Yes Pahwani, Fredia, MD  irbesartan  (AVAPRO ) 300 MG tablet Take 1 tablet (300 mg total) by mouth daily. 02/22/24  Yes Norleen Lynwood ORN, MD  loratadine  (CLARITIN ) 10 MG tablet Take 10 mg by mouth daily as needed.   Yes [provider]  pantoprazole  (PROTONIX ) 40 MG tablet Take 1 tablet (40 mg total) by mouth daily. 02/22/24  Yes Norleen Lynwood ORN, MD    Physical Exam: Vitals:   03/09/24 0530 03/09/24 0600 03/09/24 0700 03/09/24 0909  BP: (!) 169/96 (!) 154/77 (!) 154/90 (!) 168/55  Pulse: 75 78 74 (!) 41  Resp: (!) 33 (!) 30 (!) 27 18  Temp:   98.8 F (37.1 C) 98.2 F (36.8 C)  TempSrc:   Oral   SpO2: 98% 98% 98% 94%    Constitutional: chronically ill appearing Vitals:   03/09/24 0530 03/09/24 0600 03/09/24 0700 03/09/24 0909  BP: (!) 169/96 (!) 154/77 (!) 154/90 (!) 168/55  Pulse: 75 78 74 (!) 41  Resp: (!) 33 (!) 30 (!) 27 18  Temp:   98.8 F (37.1 C) 98.2 F (36.8 C)  TempSrc:   Oral   SpO2: 98% 98% 98% 94%   Eyes: PERRL ENMT: Mucous membranes are moist. Neck: normal, supple Respiratory: coarse breath sounds, requiring supplemental o2, frequent cough Cardiovascular: Regular rate and rhythm, no murmurs /  rubs / gallops. No  extremity edema. 2+ pedal pulses.  Abdomen: no tenderness, no masses palpated Musculoskeletal: no clubbing / cyanosis. No joint deformity upper and lower extremities. Good ROM, no contractures. Normal muscle tone.  Skin: reviewed images of L foot taken yesterday - see media Neurologic: CN 2-12 grossly intact. Moving all extremities Psychiatric: Normal judgment and insight. Alert and oriented x 3. Normal mood.   Labs on Admission: I have personally reviewed following labs and imaging studies  CBC: Recent Labs  Lab 03/03/24 0601 03/04/24 0929 03/08/24 2358  WBC 11.7* 11.1* 9.9  NEUTROABS  --  7.8*  --   HGB 13.7 13.2 12.7*  HCT 39.4 38.4* 36.5*  MCV 93.8 95.0 93.4  PLT 270 317 273    Basic Metabolic Panel: Recent Labs  Lab 03/03/24 0601 03/04/24 0929 03/08/24 2358  NA 137 134* 123*  K 4.1 4.3 3.8  CL 97* 98 90*  CO2 30 28 24   GLUCOSE 139* 215* 278*  BUN 8 13 8   CREATININE 0.86 0.97 0.73  CALCIUM  9.1 8.8* 8.5*  MG 1.4* 1.6*  --     GFR: Estimated Creatinine Clearance: 86.7 mL/min (by C-G formula based on SCr of 0.73 mg/dL).  Liver Function Tests: No results for input(s): AST, ALT, ALKPHOS, BILITOT, PROT, ALBUMIN in the last 168 hours.  Urine analysis:    Component Value Date/Time   COLORURINE STRAW (Matelyn Antonelli) 02/27/2024 1716   APPEARANCEUR CLEAR 02/27/2024 1716   LABSPEC 1.027 02/27/2024 1716   PHURINE 5.0 02/27/2024 1716   GLUCOSEU >=500 (Jaykwon Morones) 02/27/2024 1716   GLUCOSEU >=1000 (Kevonna Nolte) 02/22/2024 1454   HGBUR NEGATIVE 02/27/2024 1716   BILIRUBINUR NEGATIVE 02/27/2024 1716   KETONESUR NEGATIVE 02/27/2024 1716   PROTEINUR NEGATIVE 02/27/2024 1716   UROBILINOGEN 0.2 02/22/2024 1454   NITRITE NEGATIVE 02/27/2024 1716   LEUKOCYTESUR TRACE (Oniya Mandarino) 02/27/2024 1716    Radiological Exams on Admission: CT Angio Chest PE W and/or Wo Contrast Result Date: 03/09/2024 CLINICAL DATA:  Shortness of breath EXAM: CT ANGIOGRAPHY CHEST WITH CONTRAST TECHNIQUE:  Multidetector CT imaging of the chest was performed using the standard protocol during bolus administration of intravenous contrast. Multiplanar CT image reconstructions and MIPs were obtained to evaluate the vascular anatomy. RADIATION DOSE REDUCTION: This exam was performed according to the departmental dose-optimization program which includes automated exposure control, adjustment of the mA and/or kV according to patient size and/or use of iterative reconstruction technique. CONTRAST:  75mL OMNIPAQUE  IOHEXOL  350 MG/ML SOLN COMPARISON:  Chest x-ray from earlier in the same day. CT from 06/05/2019 FINDINGS: Cardiovascular: Atherosclerotic calcifications of the thoracic aorta are seen. No aneurysmal dilatation is noted. The pulmonary artery shows Bethann Qualley normal branching pattern bilaterally. No intraluminal filling defect to suggest pulmonary embolism is seen. Coronary calcifications are noted. No cardiac enlargement is seen. Mediastinum/Nodes: Thoracic inlet is within normal limits. Scattered small mediastinal nodes are seen. One dominant node is noted in the precarinal region measuring up to 15 mm. The esophagus as visualized is within normal limits. Lungs/Pleura: Lungs are well aerated bilaterally. No focal infiltrate or sizable effusion is seen. No pneumothorax is noted. Mild emphysematous changes are noted. Small less than 5 mm parenchymal nodules are noted in the lower lobes bilaterally. No sizable parenchymal nodule is seen. Upper Abdomen: Visualized upper abdomen shows no acute abnormality. Musculoskeletal: Degenerative changes of the thoracic spine are noted. No acute rib abnormality is noted. Review of the MIP images confirms the above findings. IMPRESSION: No evidence of pulmonary emboli. Small parenchymal nodules in the lower lobes bilaterally.  No follow-up needed if patient is low-risk (and has no known or suspected primary neoplasm). Non-contrast chest CT can be considered in 12 months if patient is  high-risk. This recommendation follows the consensus statement: Guidelines for Management of Incidental Pulmonary Nodules Detected on CT Images: From the Fleischner Society 2017; Radiology 2017; 284:228-243. Scattered mediastinal and hilar nodes. The largest of these lies in the precarinal region. These may simply be reactive in nature. Short-term follow-up in 3-6 months is recommended to assess for stability/resolution. Aortic Atherosclerosis (ICD10-I70.0) and Emphysema (ICD10-J43.9). Electronically Signed   By: Oneil Devonshire M.D.   On: 03/09/2024 03:36   DG Chest 2 View Result Date: 03/09/2024 CLINICAL DATA:  Shortness of breath EXAM: CHEST - 2 VIEW COMPARISON:  01/13/2023 FINDINGS: Mild hyperinflation. Heart and mediastinal contours are within normal limits. No focal opacities or effusions. No acute bony abnormality. IMPRESSION: Hyperinflation.  No active cardiopulmonary disease. Electronically Signed   By: Franky Crease M.D.   On: 03/09/2024 00:34    EKG: Independently reviewed. Sinus, PAC's, LAD - appears similar to prior   Assessment/Plan Principal Problem:   Hypoxemia Active Problems:   Parainfluenza virus infection    Assessment and Plan:  Parainfluenza Virus Infection Acute Hypoxic Respiratory Failure  Emphysema  CT with well aerated lungs, no focal infiltrate Negative covid, flu, RSV RVP with parainfluenza virus  Scheduled and prn nebs No indication for steroids without wheezing.  He's on abx for his foot infection, but I don't think any indicated for the respiratory infection.  With emphysema on imaging, may benefit from inhaler at discharge  Hyponatremia Appears euvolemic.  Repeat labs pending, NS has been pending.  Currently on IVF - will follow response of fluids Free T4 mildly elevated Citalopram  could be contributing - continue for now, hold if worsening Una, urine osm pending Strict I/O, daily weights  Cellulitis and Abscess of Toe of L Foot  Osteomyelitis  S/p  partial L hallux amputation and heel wound debridement See podiatry note 7/29 - planned additional 7 days augmentin , concern for his ability to care for wound (was cleaning wound with stored rainwater - there was evidence of necrosis and maceration due to noncompliance and poor wound hygiene). Wound care per podiatry  Follow with podiatry outpatient   Abnormal Thyroid  Function Test Repeat TSH/free T4 within 4-6 weeks  PAD s/p L SFA stenting on 7/7 Coronary Artery Disease Elevated Troponin Continue DAPT Troponin flat, elevated trop related to demand from parainfluenza virus infection as noted above  Hypertension Holding irbesartan  for now   T2DM, uncontrolled with hyperglycemia Reduced dose basal and SSI  A1c 11.2  GERD PPI  Tobacco Abuse Encourage cessation, declined patch  Aortic Atherosclerosis Noted  Pulmonary Nodules Needs follow up imaging based on his smoking history  Mediastinal and Hilar Lymph Nodes Possibly reactive, needs follow up imaging in 3-6 months   No utilities since May per last discharge summary.  Complicates things like wound care (water for wound care) and storage of his insulin .   He had Nicci Vaughan bag of medicines at bedside with spironolactone  and clindamycin , but no aspirin  or plavix .  Clindamycin  was supposed to be stopped based on last d/c summary.  Spironolactone  not on last discharge med list.  He may need refills of antiplatelets.  Follow pending med rec - seems the medicines he has don't reflect the recent discharge.    DVT prophylaxis: lovenox   Code Status:   full  Family Communication:  none  Disposition Plan:   Patient is  from:  home  Anticipated DC to:  home  Anticipated DC date:  Pending improvement in resp status  Anticipated DC barriers: SOB  Consults called:  Podiatry messaged  Admission status:  inpatient   Severity of Illness: The appropriate patient status for this patient is INPATIENT. Inpatient status is judged to be reasonable  and necessary in order to provide the required intensity of service to ensure the patient's safety. The patient's presenting symptoms, physical exam findings, and initial radiographic and laboratory data in the context of their chronic comorbidities is felt to place them at high risk for further clinical deterioration. Furthermore, it is not anticipated that the patient will be medically stable for discharge from the [name] within 2 midnights of admission.   * I certify that at the point of admission it is my clinical judgment that the patient will require inpatient [name] care spanning beyond 2 midnights from the point of admission due to high intensity of service, high risk for further deterioration and high frequency of surveillance required.DEWAINE Meliton Monte MD [facility]  How to contact the [name] Attending or Consulting provider 7A - 7P or covering provider during after hours 7P -7A, for this patient?   Check the care team in [name] and look for Ascencion Coye) attending/consulting [facility] provider listed and b) the [facility] team listed Log into [email or web address] and use [place]'s universal password to access. If you do not have the password, please contact the [name] operator. Locate the [facility] provider you are looking for under [facility] and page to Ibrahim Mcpheeters number that you can be directly reached. If you still have difficulty reaching the provider, please page the [name] (Director on Call) for the Hospitalists listed on amion for assistance.  03/09/2024, 9:52 AM

## 2024-03-09 NOTE — Plan of Care (Signed)
  Problem: Acute Rehab PT Goals(only PT should resolve) Goal: Patient Will Transfer Sit To/From Stand Outcome: Progressing Flowsheets (Taken 03/09/2024 1534) Patient will transfer sit to/from stand: with supervision Note: Using RW Goal: Pt Will Transfer Bed To Chair/Chair To Bed Outcome: Progressing Flowsheets (Taken 03/09/2024 1534) Pt will Transfer Bed to Chair/Chair to Bed: with supervision Note: Using RW Goal: Pt Will Ambulate Outcome: Progressing Flowsheets (Taken 03/09/2024 1534) Pt will Ambulate:  100 feet  with contact guard assist  with least restrictive assistive device

## 2024-03-09 NOTE — ED Provider Notes (Signed)
 [facility] AT [facility] Provider Note   CSN: [id number] Arrival date & time: 03/08/24  2346     Patient presents with: Shortness of Breath   [name] is a 68 y.o. male.   The history is provided by the patient and medical records.  Shortness of Breath  68 year old male with history of aortic atherosclerosis, diabetes, dyslipidemia, ongoing tobacco use, recent osteomyelitis and amputations of left foot, presenting to the ED with shortness of breath.  Patient states he has had a cough for a few days, spoke with his pharmacist who encouraged him to take Mucinex  without much relief.  Today around 1 PM he was going to an appointment and all of a sudden got acutely worse so EMS was called.  His sats were 91% on room air.  States his breathing has been variable throughout the day depending on what he is doing and how he is sitting.  States he feels a lot of congestion in his chest but is not able to produce any mucus despite mucinex .  Sats marginal during assessment around 90%, he is not generally O2 dependent.  Prior to Admission medications   Medication Sig Start Date End Date Taking? Authorizing Provider  amoxicillin -clavulanate (AUGMENTIN ) 875-125 MG tablet Take 1 tablet by mouth 2 (two) times daily. 03/08/24   Standiford, Marsa FALCON, DPM  aspirin  EC 81 MG tablet Take 1 tablet (81 mg total) by mouth daily. Swallow whole. 02/17/24   Baglia, Corrina, PA-C  atorvastatin  (LIPITOR ) 40 MG tablet TAKE 1 TABLET BY MOUTH ONCE DAILY 02/22/24   Norleen Lynwood LELON, MD  Blood Glucose Monitoring Suppl (ONE TOUCH ULTRA 2) w/Device KIT Use as directed to check blood sugars twice a day 11/25/19   Norleen Lynwood LELON, MD  citalopram  (CELEXA ) 20 MG tablet Take 1 tablet (20 mg total) by mouth daily. 02/22/24   Norleen Lynwood LELON, MD  clopidogrel  (PLAVIX ) 75 MG tablet Take 1 tablet (75 mg total) by mouth daily. 02/22/24 02/21/25  Norleen Lynwood LELON, MD  insulin  glargine, 2 Unit Dial , (TOUJEO  MAX  SOLOSTAR) 300 UNIT/ML Solostar Pen Inject 35 Units into the skin every morning. 03/04/24   Vernon Ranks, MD  Insulin  Pen Needle 32G X 4 MM MISC Use with insulin  pens. 03/04/24   Vernon Ranks, MD  irbesartan  (AVAPRO ) 300 MG tablet Take 1 tablet (300 mg total) by mouth daily. 02/22/24   Norleen Lynwood LELON, MD  Lancets [facility] DELICA PLUS [place]) MISC CHECK BLOOD SUGAR TWICE DAILY 09/10/23   Norleen Lynwood LELON, MD  loratadine  (CLARITIN ) 10 MG tablet Take 10 mg by mouth daily as needed.    [provider]  [facility] ULTRA test strip CHECK BLOOD SUGAR TWICE DAILY 11/25/22   Norleen Lynwood LELON, MD  pantoprazole  (PROTONIX ) 40 MG tablet Take 1 tablet (40 mg total) by mouth daily. 02/22/24   Norleen Lynwood LELON, MD    Allergies: Cefepime , Metoprolol , and Myrbetriq  [mirabegron ]    Review of Systems  Respiratory:  Positive for shortness of breath.   All other systems reviewed and are negative.   Updated Vital Signs BP (!) 158/86   Pulse 86   Temp 98.7 F (37.1 C) (Oral)   Resp 20   SpO2 96%   Physical Exam Vitals and nursing note reviewed.  Constitutional:      Appearance: He is well-developed.     Comments: Chronically ill appearing  HENT:     Head: Normocephalic and atraumatic.  Eyes:  Conjunctiva/sclera: Conjunctivae normal.     Pupils: Pupils are equal, round, and reactive to light.  Cardiovascular:     Rate and Rhythm: Normal rate and regular rhythm.     Heart sounds: Normal heart sounds.  Pulmonary:     Effort: Pulmonary effort is normal.     Breath sounds: Normal breath sounds.     Comments: Wet, rattling cough present on exam, no production of mucous, sats around 90% during exam, placed on 2L and improved to around 95% Abdominal:     General: Bowel sounds are normal.     Palpations: Abdomen is soft.  Musculoskeletal:        General: Normal range of motion.     Cervical back: Normal range of motion.     Comments: Left foot bandaged with post-op shoe in place  Skin:    General:  Skin is warm and dry.  Neurological:     Mental Status: He is alert and oriented to person, place, and time.     (all labs ordered are listed, but only abnormal results are displayed) Labs Reviewed  BASIC METABOLIC PANEL WITH GFR - Abnormal; Notable for the following components:      Result Value   Sodium 123 (*)    Chloride 90 (*)    Glucose, Bld 278 (*)    Calcium  8.5 (*)    All other components within normal limits  CBC - Abnormal; Notable for the following components:   RBC 3.91 (*)    Hemoglobin 12.7 (*)    HCT 36.5 (*)    All other components within normal limits  TROPONIN I (HIGH SENSITIVITY) - Abnormal; Notable for the following components:   Troponin I (High Sensitivity) 53 (*)    All other components within normal limits  TROPONIN I (HIGH SENSITIVITY) - Abnormal; Notable for the following components:   Troponin I (High Sensitivity) 51 (*)    All other components within normal limits  I-STAT CG4 LACTIC ACID, ED  I-STAT CG4 LACTIC ACID, ED  Renal function  EKG: None  Radiology: DG Chest 2 View Result Date: 03/09/2024 CLINICAL DATA:  Shortness of breath EXAM: CHEST - 2 VIEW COMPARISON:  01/13/2023 FINDINGS: Mild hyperinflation. Heart and mediastinal contours are within normal limits. No focal opacities or effusions. No acute bony abnormality. IMPRESSION: Hyperinflation.  No active cardiopulmonary disease. Electronically Signed   By: Franky Crease M.D.   On: 03/09/2024 00:34     Procedures   Medications Ordered in the ED - No data to display                                  Medical Decision Making Amount and/or Complexity of Data Reviewed Labs: ordered. Radiology: ordered and independent interpretation performed. ECG/medicine tests: ordered and independent interpretation performed.  Risk Prescription drug management. Decision regarding hospitalization.   68 year old male presenting to the ED with shortness of breath.  Cough for the past few days but  acutely worsened today.  Found to be hypoxic with EMS, not generally O2 dependent.  Has had multiple recent surgeries on his left foot due to osteomyelitis.  He is afebrile and nontoxic in appearance on my exam but is chronically ill-appearing.  Sats are marginal at 90%, placed back on 2 L and improved to the mid 90s.  He does have wet, rattling cough but no production of mucus.  Labs as above--hyponatremic at 123 which appears to  be new.  Renal function is normal.  No leukocytosis.  Troponins are elevated but flat at 53 and 51 respectively.  Chest x-ray without acute findings.  Given his ongoing symptoms and new O2 requirement with mildly elevated troponins, will obtain CTA to assess for PE, occult pneumonia, or other acute findings.  He will require admission.  CTA negative for acute findings.  Does have some pulmonary nodules and hilar lymphadenopathy seen.  Serum and urine osmolality has been sent along with TSH.  Discussed with Dr. Debby-- will admit for ongoing care.  Final diagnoses:  Hypoxia  Hyponatremia    ED Discharge Orders     None          Jarold Olam HERO, PA-C 03/09/24 [id number]    Haze Lonni PARAS, MD 03/09/24 [phone]

## 2024-03-09 NOTE — Progress Notes (Deleted)
 History and Physical    [name] FMW:[id number] DOB: 01/23/1956 DOA: 03/08/2024  PCP: Norleen Lynwood LELON, MD  Patient coming from: home  I have personally briefly reviewed patient's old medical records in [facility]  Chief Complaint: shortness of breath, feels like he has pneumonia  HPI: [name] is [name] 68 y.o. male with medical history significant of CAD, PAD s/p stent, diabetes, diabetic foot infection and multiple other medical issues here with concern for pneumonia.  He notes that his symptoms started yesterday.  He felt [name] thickness in his chest and felt like he could not breathe.  He notes [name] cough productive of white sputum.  Describes chest pain with cough and shortness of breath with movement.  He notes he feels like he has pneumonia.  He describes subjective fever yesterday.  Notes chest pain with cough.  No abdominal pain, nausea, vomiting, numbness, tingling, weakness.  Smokes cigars.  No alcohol.  ED Course: Labs, imaging.  Parainfluenza virus on RVP.  Carryover admission.   Review of Systems: As per HPI otherwise all other systems reviewed and are negative.  Past Medical History:  Diagnosis Date   Adenomatous colon polyp    Allergic rhinitis    At risk for sleep apnea    STOP-BANG= 5   SENT TO PCP 03-14-2014   Bladder cancer (HCC)    CAD (coronary artery disease)    Halsey Hammen. 07/2014 low risk MV; b. 07/2019 Cor CTA (FFR): LM 25-49 (nl), LAD mild prox/mid plaque (nl), D1 25-49p(nl w/ abnl FFR of 0.73 in inf branch), LCX/OM1 mild prox/mid plaque (nl), RCA nondominant, minimal Ca2+ plaque (nl), RPDA (mildly abnl @ 0.79)-->Med Rx..   Cataract    surgically removed bilateral   Condyloma acuminatum of penis    Diabetic neuropathy (HCC)    Diastolic dysfunction    Ever Gustafson. 05/2019 Echo: EF 60-65%, no rwam, mod LVH, impaired relaxation, nl RV size/fxn, trace MR, triv TR.   Diverticulosis    GERD (gastroesophageal reflux disease)    History of bladder cancer    s/p  turbt  2013/    transitional cell carcinoma--    History of condyloma acuminatum    PERINEAL AREA  W/ RECURRENCY   History of gout    Hyperlipidemia    Hypertension    Hypoxemia 03/09/2024   Lower urinary tract symptoms (LUTS)    PAF (paroxysmal atrial fibrillation) (HCC)    Kaeli Nichelson. 06/2019 Event monitor: PAF <1% burden. Longest 3 mins 36 secs.   Productive cough    PSVT (paroxysmal supraventricular tachycardia) (HCC)    Valerian Jewel. 06/2019 Event monitor: 112 episodes of SVT, longest 21 secs.   PVD (peripheral vascular disease) with claudication (HCC)    Lavelle Berland. 03/2014 LE art duplex: long segment occlusion of mid to distal R SFA; b. 03/2020 ABI: nl left and mildly improved R ABI->med rx.   Renal artery stenosis (HCC)    Renal artery stenosis (HCC)    Mumin Denomme. 12/2020 Renal art duplex: RRA 1-59%, LRA >60%.   Smokers' cough (HCC)    Type 2 diabetes mellitus with insulin  therapy [facility] 1992   monitor by  dr ellsion   Wears dentures    upper    Past Surgical History:  Procedure Laterality Date   ABDOMINAL AORTOGRAM W/LOWER EXTREMITY N/Josanne Boerema 02/15/2024   Procedure: ABDOMINAL AORTOGRAM W/LOWER EXTREMITY;  Surgeon: Pearline Norman RAMAN, MD;  Location: [facility] INVASIVE CV LAB;  Service: Cardiovascular;  Laterality: N/Baer Hinton;   AMPUTATION Left 01/02/2024   Procedure:  AMPUTATION, FOOT, RAY;  Surgeon: Malvin Marsa FALCON, DPM;  Location: [facility] OR;  Service: Orthopedics/Podiatry;  Laterality: Left;  FIFTH TOE   AMPUTATION TOE Left 02/29/2024   Procedure: AMPUTATION, LEFT GREAT TOE;  Surgeon: Malvin Marsa FALCON, DPM;  Location: [facility] OR;  Service: Orthopedics/Podiatry;  Laterality: Left;  Left Great toe partial amputation,  Debridement Left Heel   AXILLARY HIDRADENITIS EXCISION  1997   CARDIOVASCULAR STRESS TEST  07-24-2014  dr darron grass   Low risk lexiscan  nuclear study with apical thinning and small inferolateral wall infarct at mid & basal level , no ischemia/  normal LVF and wall motion , ef 59%   CATARACT EXTRACTION Left    CATARACT EXTRACTION  W/ INTRAOCULAR LENS IMPLANT Right    CO2 LASER APPLICATION N/Wahid Holley 03/20/2014   Procedure: CO2 LASER APPLICATION,PENIS, GROIN, ANUS;  Surgeon: Elspeth KYM Schultze, MD;  Location: [facility];  Service: General;  Laterality: N/Eriyanna Kofoed;   CO2 LASER APPLICATION N/Andrianna Manalang 05/21/2015   Procedure: CO2 LASER APPLICATION;  Surgeon: Mark Ottelin, MD;  Location: [facility];  Service: Urology;  Laterality: N/Arelie Kuzel;   CONDYLOMA EXCISION/FULGURATION N/Field Staniszewski 05/21/2015   Procedure: CONDYLOMA REMOVAL;  Surgeon: Mark Ottelin, MD;  Location: [facility];  Service: Urology;  Laterality: N/Marita Burnsed;   HEMORRHOID SURGERY  10/24/2014   Procedure: HEMORRHOIDECTOMY;  Surgeon: Elspeth Schultze, MD;  Location: [facility];  Service: General;;   INCISION AND DRAINAGE ABSCESS Left 10/24/2014   Procedure: INCISION AND DRAINAGE ABSCESS;  Surgeon: Elspeth Schultze, MD;  Location: [facility];  Service: General;  Laterality: Left;   INGUINAL HIDRADENITIS EXCISION  1998, 1999   LASER ABLATION CONDOLAMATA N/Aijalon Kirtz 03/20/2014   Procedure: EXAM UNDER ANESTHESIA, REMOVAL/ABLATION OF CONDYLOMATA PENIS,GROINS, ANUS, ANAL CANAL;  Surgeon: Elspeth KYM Schultze, MD;  Location: [facility];  Service: General;  Laterality: N/Faten Frieson;  groin and anus   LASER ABLATION CONDOLAMATA N/Binnie Vonderhaar 10/24/2014   Procedure: LASER ABLATION CONDOLAMATA;  Surgeon: Elspeth Schultze, MD;  Location: [facility];  Service: General;  Laterality: N/Jemia Fata;   LASER ABLATION OF PENILE AND PERIANAL WARTS  07-29-2007  Dr. Schultze   LEFT SHOULDER SURGERY  2003   LOWER EXTREMITY ANGIOGRAPHY N/Deon Ivey 01/01/2024   Procedure: Lower Extremity Angiography;  Surgeon: Sheree Penne Bruckner, MD;  Location: [facility] INVASIVE CV LAB;  Service: Vascular;  Laterality: N/Iline Buchinger;   LOWER EXTREMITY ANGIOGRAPHY N/Welford Christmas 02/15/2024   Procedure: Lower Extremity Angiography;  Surgeon: Pearline Norman RAMAN, MD;  Location: [facility] INVASIVE CV LAB;  Service: Cardiovascular;  Laterality:  N/Maygen Sirico;   LOWER EXTREMITY INTERVENTION  01/01/2024   Procedure: LOWER EXTREMITY INTERVENTION;  Surgeon: Sheree Penne Bruckner, MD;  Location: [facility] INVASIVE CV LAB;  Service: Vascular;;   LOWER EXTREMITY INTERVENTION N/Kanen Mottola 02/15/2024   Procedure: LOWER EXTREMITY INTERVENTION;  Surgeon: Pearline Norman RAMAN, MD;  Location: [facility] INVASIVE CV LAB;  Service: Cardiovascular;  Laterality: N/Aras Albarran;   MASS EXCISION N/Theodoro Koval 10/24/2014   Procedure: EXCISION OF PERINEAL MASS/SINUS;  Surgeon: Elspeth Schultze, MD;  Location: [facility];  Service: General;  Laterality: N/Demontray Franta;   MOHS SURGERY     back   MOHS SURGERY  2017   face   MULTIPLE TOOTH EXTRACTIONS     PERINEAL HIDRADENITIS EXCISION  1998, 1999   TRANSURETHRAL RESECTION OF BLADDER TUMOR  05/21/2012   Procedure: TRANSURETHRAL RESECTION OF BLADDER TUMOR (TURBT);  Surgeon: Mark C Ottelin, MD;  Location: [facility];  Service: Urology;  Laterality: N/Quincey Quesinberry;  Social History  reports that he has been smoking cigars and cigarettes. He started smoking about 49 years ago. He has Tani Virgo 41 pack-year smoking history. He has never used smokeless tobacco. He reports that he does not drink alcohol and does not use drugs.  Allergies  Allergen Reactions   Cefepime  Hives    Had allergic reaction to one of these 3 agents, unclear which one   Metoprolol  Hives    Had allergic reaction to one of these 3 agents, unclear which one  *Per RN, highly likely this is the cause of allergic rxn   Myrbetriq  [Mirabegron ] Hives    Had allergic reaction to one of these 3 agents, unclear which one    Family History  Problem Relation Age of Onset   Hypertension Mother    Lung cancer Father    Diabetes Maternal Aunt        x 2   Colon cancer Neg Hx    Esophageal cancer Neg Hx    Pancreatic cancer Neg Hx    Prostate cancer Neg Hx    Kidney disease Neg Hx    Liver disease Neg Hx    Rectal cancer Neg Hx    Stomach cancer Neg Hx      Prior to Admission medications    Medication Sig Start Date End Date Taking? Authorizing Provider  amoxicillin -clavulanate (AUGMENTIN ) 875-125 MG tablet Take 1 tablet by mouth 2 (two) times daily. 03/08/24  Yes Standiford, Marsa FALCON, DPM  aspirin  EC 81 MG tablet Take 1 tablet (81 mg total) by mouth daily. Swallow whole. 02/17/24  Yes Baglia, Corrina, PA-C  atorvastatin  (LIPITOR ) 40 MG tablet TAKE 1 TABLET BY MOUTH ONCE DAILY 02/22/24  Yes Norleen Lynwood ORN, MD  citalopram  (CELEXA ) 20 MG tablet Take 1 tablet (20 mg total) by mouth daily. 02/22/24  Yes Norleen Lynwood ORN, MD  clopidogrel  (PLAVIX ) 75 MG tablet Take 1 tablet (75 mg total) by mouth daily. 02/22/24 02/21/25 Yes Norleen Lynwood ORN, MD  insulin  glargine, 2 Unit Dial , (TOUJEO  MAX SOLOSTAR) 300 UNIT/ML Solostar Pen Inject 35 Units into the skin every morning. 03/04/24  Yes Pahwani, Fredia, MD  irbesartan  (AVAPRO ) 300 MG tablet Take 1 tablet (300 mg total) by mouth daily. 02/22/24  Yes Norleen Lynwood ORN, MD  loratadine  (CLARITIN ) 10 MG tablet Take 10 mg by mouth daily as needed.   Yes [provider]  pantoprazole  (PROTONIX ) 40 MG tablet Take 1 tablet (40 mg total) by mouth daily. 02/22/24  Yes Norleen Lynwood ORN, MD    Physical Exam: Vitals:   03/09/24 0530 03/09/24 0600 03/09/24 0700 03/09/24 0909  BP: (!) 169/96 (!) 154/77 (!) 154/90 (!) 168/55  Pulse: 75 78 74 (!) 41  Resp: (!) 33 (!) 30 (!) 27 18  Temp:   98.8 F (37.1 C) 98.2 F (36.8 C)  TempSrc:   Oral   SpO2: 98% 98% 98% 94%    Constitutional: NAD, calm, comfortable Vitals:   03/09/24 0530 03/09/24 0600 03/09/24 0700 03/09/24 0909  BP: (!) 169/96 (!) 154/77 (!) 154/90 (!) 168/55  Pulse: 75 78 74 (!) 41  Resp: (!) 33 (!) 30 (!) 27 18  Temp:   98.8 F (37.1 C) 98.2 F (36.8 C)  TempSrc:   Oral   SpO2: 98% 98% 98% 94%   Eyes: PERRL, lids and conjunctivae normal ENMT: Mucous membranes are moist. Posterior pharynx clear of any exudate or lesions.Normal dentition.  Neck: normal, supple, no masses, no  thyromegaly Respiratory: clear to  auscultation bilaterally, no wheezing, no crackles. Normal respiratory effort. No accessory muscle use.  Cardiovascular: Regular rate and rhythm, no murmurs / rubs / gallops. No extremity edema. 2+ pedal pulses. No carotid bruits.  Abdomen: no tenderness, no masses palpated. No hepatosplenomegaly. Bowel sounds positive.  Musculoskeletal: no clubbing / cyanosis. No joint deformity upper and lower extremities. Good ROM, no contractures. Normal muscle tone.  Skin: no rashes, lesions, ulcers. No induration Neurologic: CN 2-12 grossly intact. Sensation intact, DTR normal. Strength 5/5 in all 4.  Psychiatric: Normal judgment and insight. Alert and oriented x 3. Normal mood.   Labs on Admission: I have personally reviewed following labs and imaging studies  CBC: Recent Labs  Lab 03/03/24 0601 03/04/24 0929 03/08/24 2358  WBC 11.7* 11.1* 9.9  NEUTROABS  --  7.8*  --   HGB 13.7 13.2 12.7*  HCT 39.4 38.4* 36.5*  MCV 93.8 95.0 93.4  PLT 270 317 273    Basic Metabolic Panel: Recent Labs  Lab 03/03/24 0601 03/04/24 0929 03/08/24 2358  NA 137 134* 123*  K 4.1 4.3 3.8  CL 97* 98 90*  CO2 30 28 24   GLUCOSE 139* 215* 278*  BUN 8 13 8   CREATININE 0.86 0.97 0.73  CALCIUM  9.1 8.8* 8.5*  MG 1.4* 1.6*  --     GFR: Estimated Creatinine Clearance: 86.7 mL/min (by C-G formula based on SCr of 0.73 mg/dL).  Liver Function Tests: No results for input(s): AST, ALT, ALKPHOS, BILITOT, PROT, ALBUMIN in the last 168 hours.  Urine analysis:    Component Value Date/Time   COLORURINE STRAW (Orlie Cundari) 02/27/2024 1716   APPEARANCEUR CLEAR 02/27/2024 1716   LABSPEC 1.027 02/27/2024 1716   PHURINE 5.0 02/27/2024 1716   GLUCOSEU >=500 (Loraina Stauffer) 02/27/2024 1716   GLUCOSEU >=1000 (Elleana Stillson) 02/22/2024 1454   HGBUR NEGATIVE 02/27/2024 1716   BILIRUBINUR NEGATIVE 02/27/2024 1716   KETONESUR NEGATIVE 02/27/2024 1716   PROTEINUR NEGATIVE 02/27/2024 1716   UROBILINOGEN 0.2  02/22/2024 1454   NITRITE NEGATIVE 02/27/2024 1716   LEUKOCYTESUR TRACE (Bastien Strawser) 02/27/2024 1716    Radiological Exams on Admission: CT Angio Chest PE W and/or Wo Contrast Result Date: 03/09/2024 CLINICAL DATA:  Shortness of breath EXAM: CT ANGIOGRAPHY CHEST WITH CONTRAST TECHNIQUE: Multidetector CT imaging of the chest was performed using the standard protocol during bolus administration of intravenous contrast. Multiplanar CT image reconstructions and MIPs were obtained to evaluate the vascular anatomy. RADIATION DOSE REDUCTION: This exam was performed according to the departmental dose-optimization program which includes automated exposure control, adjustment of the mA and/or kV according to patient size and/or use of iterative reconstruction technique. CONTRAST:  75mL OMNIPAQUE  IOHEXOL  350 MG/ML SOLN COMPARISON:  Chest x-ray from earlier in the same day. CT from 06/05/2019 FINDINGS: Cardiovascular: Atherosclerotic calcifications of the thoracic aorta are seen. No aneurysmal dilatation is noted. The pulmonary artery shows Vue Pavon normal branching pattern bilaterally. No intraluminal filling defect to suggest pulmonary embolism is seen. Coronary calcifications are noted. No cardiac enlargement is seen. Mediastinum/Nodes: Thoracic inlet is within normal limits. Scattered small mediastinal nodes are seen. One dominant node is noted in the precarinal region measuring up to 15 mm. The esophagus as visualized is within normal limits. Lungs/Pleura: Lungs are well aerated bilaterally. No focal infiltrate or sizable effusion is seen. No pneumothorax is noted. Mild emphysematous changes are noted. Small less than 5 mm parenchymal nodules are noted in the lower lobes bilaterally. No sizable parenchymal nodule is seen. Upper Abdomen: Visualized upper abdomen shows no acute abnormality.  Musculoskeletal: Degenerative changes of the thoracic spine are noted. No acute rib abnormality is noted. Review of the MIP images confirms the  above findings. IMPRESSION: No evidence of pulmonary emboli. Small parenchymal nodules in the lower lobes bilaterally. No follow-up needed if patient is low-risk (and has no known or suspected primary neoplasm). Non-contrast chest CT can be considered in 12 months if patient is high-risk. This recommendation follows the consensus statement: Guidelines for Management of Incidental Pulmonary Nodules Detected on CT Images: From the Fleischner Society 2017; Radiology 2017; 284:228-243. Scattered mediastinal and hilar nodes. The largest of these lies in the precarinal region. These may simply be reactive in nature. Short-term follow-up in 3-6 months is recommended to assess for stability/resolution. Aortic Atherosclerosis (ICD10-I70.0) and Emphysema (ICD10-J43.9). Electronically Signed   By: Oneil Devonshire M.D.   On: 03/09/2024 03:36   DG Chest 2 View Result Date: 03/09/2024 CLINICAL DATA:  Shortness of breath EXAM: CHEST - 2 VIEW COMPARISON:  01/13/2023 FINDINGS: Mild hyperinflation. Heart and mediastinal contours are within normal limits. No focal opacities or effusions. No acute bony abnormality. IMPRESSION: Hyperinflation.  No active cardiopulmonary disease. Electronically Signed   By: Franky Crease M.D.   On: 03/09/2024 00:34    EKG: Independently reviewed. Sinus, PAC's, LAD - appears similar to prior   Assessment/Plan Principal Problem:   Hypoxemia Active Problems:   Parainfluenza virus infection    Assessment and Plan:  Parainfluenza Virus Infection Acute Hypoxic Respiratory Failure  Emphysema  CT with well aerated lungs, no focal infiltrate Negative covid, flu, RSV RVP with parainfluenza virus  Scheduled and prn nebs No indication for steroids without wheezing.  He's on abx for his foot infection, but I don't think any indicated for the respiratory infection.   Hyponatremia Appears euvolemic.  Repeat labs pending, NS has been pending.  Currently on IVF - will follow response of  fluids Free T4 mildly elevated Citalopram  could be contributing - continue for now, hold if worsening Una, urine osm pending Strict I/O, daily weights  Cellulitis and Abscess of Toe of L Foot  Osteomyelitis  S/p partial L hallux amputation and heel wound debridement See podiatry note 7/29 - planned additional 7 days augmentin , concern for his ability to care for wound (was cleaning wound with stored rainwater - there was evidence of necrosis and maceration due to noncompliance and poor wound hygiene). Wound care per podiatry  Follow with podiatry outpatient   Abnormal Thyroid  Function Test Repeat TSH/free T4 within 4-6 weeks  PAD s/p L SFA stenting on 7/7 Coronary Artery Disease Elevated Troponin Continue DAPT Troponin flat, elevated trop related to demand from parainfluenza virus infection as noted above  Hypertension Holding irbesartan  for now   T2DM, uncontrolled with hyperglycemia Reduced dose basal and SSI  A1c 11.2  GERD PPI  Tobacco Abuse Encourage cessation, declined patch  Aortic Atherosclerosis Noted  Pulmonary Nodules Needs follow up imaging based on his smoking history  Mediastinal and Hilar Lymph Nodes Possibly reactive, needs follow up imaging in 3-6 months   No utilities since May per last discharge summary.  Complicates things like wound care (water for wound care) and storage of his insulin .   He had Verdis Bassette bag of medicines at bedside with spironolactone  and clindamycin , but no aspirin  or plavix .  Clindamycin  was supposed to be stopped based on last d/c summary.  He may need refills of antiplatelets.  Follow pending med rec.    DVT prophylaxis: lovenox   Code Status:   full  Family Communication:  none  Disposition Plan:   Patient is from:  home  Anticipated DC to:  home  Anticipated DC date:  Pending improvement in resp status  Anticipated DC barriers: SOB  Consults called:  Podiatry messaged  Admission status:  inpatient   Severity of  Illness: The appropriate patient status for this patient is INPATIENT. Inpatient status is judged to be reasonable and necessary in order to provide the required intensity of service to ensure the patient's safety. The patient's presenting symptoms, physical exam findings, and initial radiographic and laboratory data in the context of their chronic comorbidities is felt to place them at high risk for further clinical deterioration. Furthermore, it is not anticipated that the patient will be medically stable for discharge from the hospital within 2 midnights of admission.   * I certify that at the point of admission it is my clinical judgment that the patient will require inpatient hospital care spanning beyond 2 midnights from the point of admission due to high intensity of service, high risk for further deterioration and high frequency of surveillance required.DEWAINE Meliton Monte MD [facility]  How to contact the [facility] Attending or Consulting provider 7A - 7P or covering provider during after hours 7P -7A, for this patient?   Check the care team in [facility] and look for Otilia Kareem) attending/consulting [facility] provider listed and b) the [facility] team listed Log into [email or web address] and use [facility] universal password to access. If you do not have the password, please contact the hospital operator. Locate the [facility] provider you are looking for under [facility] and page to Cadell Gabrielson number that you can be directly reached. If you still have difficulty reaching the provider, please page the [facility] (Director on Call) for the Hospitalists listed on amion for assistance.  03/09/2024, 9:22 AM

## 2024-03-09 NOTE — Progress Notes (Deleted)
 Name: [name]  Age/ Sex: 68 y.o., male   MRN/ DOB: [id number], [birth year]     PCP: Norleen Lynwood LELON, MD   Reason for Endocrinology Evaluation: Type 2 Diabetes Mellitus  Initial Endocrine Consultative Visit: 09/06/2012    PATIENT IDENTIFIER: Mr. [name] is a 68 y.o. male with a past medical history of T2DM, HTN and Hx of bladder cancer and CAD. The patient has followed with Endocrinology clinic since 09/06/2012 for consultative assistance with management of his diabetes.  DIABETIC HISTORY:  Mr. [name] was diagnosed with DM 1992.  He used to be on Metformin - diarrhea. His hemoglobin A1c has ranged from 6.9% in 2013, peaking at 10.9% in 2021  He attributes bladder cancer to actos    SUBJECTIVE:   During the last visit (07/07/2023): A1c 7.2%     Today (03/09/2024): [name]  is here for a follow up on diabetes management .He checks his blood sugars 1 times daily. The patient has had hypoglycemic episodes since the last clinic visit.  Patient is symptomatic with these episodes.  Patient was admitted in May, 2025 for left toe cellulitis/osteomyelitis and sepsis, s/p left fifth toe amputation 12/2023 S/p left SFA stent 12/2023, which was occluded and presented with ischemia of the left lower extremity July, 2025 for aortogram and angioplasty, with restenting of the left SFA.    Patient wanted to sign AMA following the procedure but eventually agreed to stay the night and wanted snacks and sodas   Patient underwent amputation of the left great toe 02/2024  Patient follows with podiatry and vascular surgery  He presented yesterday to the ED with shortness of breath   He is on pt assistance with Toujeo  and Ozempic    Patient continues to follow-up with cardiology for peripheral artery disease, CAD, paroxysmal SVT, HTN and dyslipidemia   Wife under hospice for brain tumor, he has not been sleeping and has been dizzy he has not been eating  Denies nausea or vomiting Denies  constipation or diarrhea   HOME DIABETES REGIMEN:  Toujeo  120 units daily  Ozempic  1 mg weekly ( Monday )      Statin: yes ACE-I/ARB: yes    METER DOWNLOAD SUMMARY: Unable to download [phone]     DIABETIC COMPLICATIONS: Microvascular complications:  Neuropathy Denies: CKD  Last Eye Exam: Completed 06/17/2023  Macrovascular complications:  CAD, PVD (S/P SFA stent 12/2023) Denies: CVA   HISTORY:  Past Medical History:  Past Medical History:  Diagnosis Date   Adenomatous colon polyp    Allergic rhinitis    At risk for sleep apnea    STOP-BANG= 5   SENT TO PCP 03-14-2014   Bladder cancer (HCC)    CAD (coronary artery disease)    a. 07/2014 low risk MV; b. 07/2019 Cor CTA (FFR): LM 25-49 (nl), LAD mild prox/mid plaque (nl), D1 25-49p(nl w/ abnl FFR of 0.73 in inf branch), LCX/OM1 mild prox/mid plaque (nl), RCA nondominant, minimal Ca2+ plaque (nl), RPDA (mildly abnl @ 0.79)-->Med Rx..   Cataract    surgically removed bilateral   Condyloma acuminatum of penis    Diabetic neuropathy (HCC)    Diastolic dysfunction    a. 05/2019 Echo: EF 60-65%, no rwam, mod LVH, impaired relaxation, nl RV size/fxn, trace MR, triv TR.   Diverticulosis    GERD (gastroesophageal reflux disease)    History of bladder cancer    s/p  turbt  2013/   transitional cell carcinoma--    History of condyloma acuminatum  PERINEAL AREA  W/ RECURRENCY   History of gout    Hyperlipidemia    Hypertension    Hypoxemia 03/09/2024   Lower urinary tract symptoms (LUTS)    PAF (paroxysmal atrial fibrillation) (HCC)    a. 06/2019 Event monitor: PAF <1% burden. Longest 3 mins 36 secs.   Productive cough    PSVT (paroxysmal supraventricular tachycardia) (HCC)    a. 06/2019 Event monitor: 112 episodes of SVT, longest 21 secs.   PVD (peripheral vascular disease) with claudication (HCC)    a. 03/2014 LE art duplex: long segment occlusion of mid to distal R SFA; b. 03/2020 ABI: nl left and mildly improved R  ABI->med rx.   Renal artery stenosis (HCC)    Renal artery stenosis (HCC)    a. 12/2020 Renal art duplex: RRA 1-59%, LRA >60%.   Smokers' cough (HCC)    Type 2 diabetes mellitus with insulin  therapy [facility]) 1992   monitor by  dr ellsion   Wears dentures    upper   Past Surgical History:  Past Surgical History:  Procedure Laterality Date   ABDOMINAL AORTOGRAM W/LOWER EXTREMITY N/A 02/15/2024   Procedure: ABDOMINAL AORTOGRAM W/LOWER EXTREMITY;  Surgeon: Pearline Norman RAMAN, MD;  Location: [facility] INVASIVE CV LAB;  Service: Cardiovascular;  Laterality: N/A;   AMPUTATION Left 01/02/2024   Procedure: AMPUTATION, FOOT, RAY;  Surgeon: Malvin Marsa FALCON, DPM;  Location: [facility] OR;  Service: Orthopedics/Podiatry;  Laterality: Left;  FIFTH TOE   AMPUTATION TOE Left 02/29/2024   Procedure: AMPUTATION, LEFT GREAT TOE;  Surgeon: Malvin Marsa FALCON, DPM;  Location: [facility] OR;  Service: Orthopedics/Podiatry;  Laterality: Left;  Left Great toe partial amputation,  Debridement Left Heel   AXILLARY HIDRADENITIS EXCISION  1997   CARDIOVASCULAR STRESS TEST  07-24-2014  dr darron grass   Low risk lexiscan  nuclear study with apical thinning and small inferolateral wall infarct at mid & basal level , no ischemia/  normal LVF and wall motion , ef 59%   CATARACT EXTRACTION Left    CATARACT EXTRACTION W/ INTRAOCULAR LENS IMPLANT Right    CO2 LASER APPLICATION N/A 03/20/2014   Procedure: CO2 LASER APPLICATION,PENIS, GROIN, ANUS;  Surgeon: Elspeth KYM Schultze, MD;  Location: [facility];  Service: General;  Laterality: N/A;   CO2 LASER APPLICATION N/A 05/21/2015   Procedure: CO2 LASER APPLICATION;  Surgeon: Mark Ottelin, MD;  Location: [facility];  Service: Urology;  Laterality: N/A;   CONDYLOMA EXCISION/FULGURATION N/A 05/21/2015   Procedure: CONDYLOMA REMOVAL;  Surgeon: Mark Ottelin, MD;  Location: [facility];  Service: Urology;  Laterality: N/A;   HEMORRHOID SURGERY  10/24/2014    Procedure: HEMORRHOIDECTOMY;  Surgeon: Elspeth Schultze, MD;  Location: [facility];  Service: General;;   INCISION AND DRAINAGE ABSCESS Left 10/24/2014   Procedure: INCISION AND DRAINAGE ABSCESS;  Surgeon: Elspeth Schultze, MD;  Location: [facility] [place];  Service: General;  Laterality: Left;   INGUINAL HIDRADENITIS EXCISION  1998, 1999   LASER ABLATION CONDOLAMATA N/A 03/20/2014   Procedure: EXAM UNDER ANESTHESIA, REMOVAL/ABLATION OF CONDYLOMATA PENIS,GROINS, ANUS, ANAL CANAL;  Surgeon: Elspeth KYM Schultze, MD;  Location: [facility];  Service: General;  Laterality: N/A;  groin and anus   LASER ABLATION CONDOLAMATA N/A 10/24/2014   Procedure: LASER ABLATION CONDOLAMATA;  Surgeon: Elspeth Schultze, MD;  Location: [facility];  Service: General;  Laterality: N/A;   LASER ABLATION OF PENILE AND PERIANAL WARTS  07-29-2007  Dr. Schultze   LEFT  SHOULDER SURGERY  2003   LOWER EXTREMITY ANGIOGRAPHY N/A 01/01/2024   Procedure: Lower Extremity Angiography;  Surgeon: Sheree Penne Bruckner, MD;  Location: [facility] INVASIVE CV LAB;  Service: Vascular;  Laterality: N/A;   LOWER EXTREMITY ANGIOGRAPHY N/A 02/15/2024   Procedure: Lower Extremity Angiography;  Surgeon: Pearline Norman RAMAN, MD;  Location: [facility] INVASIVE CV LAB;  Service: Cardiovascular;  Laterality: N/A;   LOWER EXTREMITY INTERVENTION  01/01/2024   Procedure: LOWER EXTREMITY INTERVENTION;  Surgeon: Sheree Penne Bruckner, MD;  Location: [facility] INVASIVE CV LAB;  Service: Vascular;;   LOWER EXTREMITY INTERVENTION N/A 02/15/2024   Procedure: LOWER EXTREMITY INTERVENTION;  Surgeon: Pearline Norman RAMAN, MD;  Location: [facility] INVASIVE CV LAB;  Service: Cardiovascular;  Laterality: N/A;   MASS EXCISION N/A 10/24/2014   Procedure: EXCISION OF PERINEAL MASS/SINUS;  Surgeon: Elspeth Schultze, MD;  Location: [facility];  Service: General;  Laterality: N/A;   MOHS SURGERY     back   MOHS SURGERY  2017   face   MULTIPLE TOOTH  EXTRACTIONS     PERINEAL HIDRADENITIS EXCISION  1998, 1999   TRANSURETHRAL RESECTION OF BLADDER TUMOR  05/21/2012   Procedure: TRANSURETHRAL RESECTION OF BLADDER TUMOR (TURBT);  Surgeon: Mark C Ottelin, MD;  Location: [facility];  Service: Urology;  Laterality: N/A;      Social History:  reports that he has been smoking cigars and cigarettes. He started smoking about 49 years ago. He has a 41 pack-year smoking history. He has never used smokeless tobacco. He reports that he does not drink alcohol and does not use drugs. Family History:  Family History  Problem Relation Age of Onset   Hypertension Mother    Lung cancer Father    Diabetes Maternal Aunt        x 2   Colon cancer Neg Hx    Esophageal cancer Neg Hx    Pancreatic cancer Neg Hx    Prostate cancer Neg Hx    Kidney disease Neg Hx    Liver disease Neg Hx    Rectal cancer Neg Hx    Stomach cancer Neg Hx      HOME MEDICATIONS: Allergies as of 03/09/2024       Reactions   Cefepime  Hives   Had allergic reaction to one of these 3 agents, unclear which one   Metoprolol  Hives   Had allergic reaction to one of these 3 agents, unclear which one *Per RN, highly likely this is the cause of allergic rxn   Myrbetriq  [mirabegron ] Hives   Had allergic reaction to one of these 3 agents, unclear which one        Medication List      Notice   This visit is during an admission. Changes to the med list made in this visit will be reflected in the After Visit Summary of the admission.      OBJECTIVE:   Vital Signs: There were no vitals taken for this visit.  Wt Readings from Last 3 Encounters:  02/29/24 159 lb 13.3 oz (72.5 kg)  02/15/24 160 lb (72.6 kg)  02/10/24 140 lb (63.5 kg)     Exam: General: Pt appears well and is in NAD  Lungs: Clear with good BS bilat   Heart: RRR  Extremities: No pretibial edema.   Neuro: MS is good with appropriate affect, pt is alert and Ox3    DM Foot Exam 04/10/2023 per  podiatry     DATA REVIEWED:  Lab Results  Component Value Date   HGBA1C 11.2 (H) 02/22/2024   HGBA1C 14.3 (H) 12/31/2023   HGBA1C 7.2 (A) 07/07/2023    Latest Reference Range & Units 03/08/24 23:58  Sodium 135 - 145 mmol/L 123 (L)  Potassium 3.5 - 5.1 mmol/L 3.8  Chloride 98 - 111 mmol/L 90 (L)  CO2 22 - 32 mmol/L 24  Glucose 70 - 99 mg/dL 721 (H)  BUN 8 - 23 mg/dL 8  Creatinine 9.38 - 8.75 mg/dL 9.26  Calcium  8.9 - 10.3 mg/dL 8.5 (L)  Anion gap 5 - 15  9  GFR, Estimated >60 mL/min >60  (L): Data is abnormally low (H): Data is abnormally high  Old records , labs and images have been reviewed.    ASSESSMENT / PLAN / RECOMMENDATIONS:   1) Type 2 Diabetes Mellitus, optimally controlled, With macrovascular and neuropathic complications - Most recent A1c of 7.2 %. Goal A1c < 7.0 %.    -A1c has trended down from 8.2% -He continues to have occasional hypoglycemia, will reduce insulin  -Patient on patient assistance program for Toujeo  and Ozempic  -He is intolerant to Victoza  1.8 mg -Intolerant to metformin  -Patient attributes bladder cancer to pioglitazone use -Not a candidate for SGLT2 inhibitors at this time due to history of bladder cancer and increased risk of infection    MEDICATIONS: Decrease Toujeo  100 units daily Continue Ozempic  1 mg weekly  EDUCATION / INSTRUCTIONS: BG monitoring instructions: Patient is instructed to check his blood sugars 1 times a day, fasting. Call [facility] if: BG persistently < 70 I reviewed the Rule of 15 for the treatment of hypoglycemia in detail with the patient. Literature supplied.   2) Diabetic complications:  Eye: Does not have known diabetic retinopathy.  Neuro/ Feet: Does not have known diabetic peripheral neuropathy .  Renal: Patient does not have known baseline CKD. He   is  on an ACEI/ARB at present.      F/U in 6 months     Signed electronically by: Stefano Redgie Butts, MD  [facility]  Endocrinology  [facility] [address] [place], [place] [address] Phone: [phone] FAX: [phone]   CC: Norleen Lynwood ORN, MD [address] [place] [address] Phone: [phone]  Fax: [phone]  Return to Endocrinology clinic as below: Future Appointments  Date Time Provider Department Center  03/09/2024 11:50 AM Dammon Makarewicz, Donell Redgie, MD [facility] None  03/15/2024  1:20 PM Norleen Lynwood ORN, MD [facility] None  03/15/2024  4:15 PM Standiford, Marsa FALCON, DPM [facility]  03/17/2024  1:00 PM Tousey, Laine M, RN [facility] None

## 2024-03-09 NOTE — Evaluation (Signed)
 Physical Therapy Evaluation Patient Details Name: [name] MRN: [id number] DOB: [birth year] Today's Date: 03/09/2024  History of Present Illness  68 yo male arrives to [facility] ED on 03/08/24 for SOB and feeling like he has pneumonia. Recently has amputations of L Great toe on 7/21 and L 5th toe 5/24. PMH: CAD, PAD s/p stent, diabetes, diabetic foot infection, hx of cancer, GERD  Clinical Impression  Pt is in bed at the start of the session and is agreeable to therapy. Pt used RW to ambulate in the room to help decrease WB on the LLE for his L partial great toe amputation. Pt is unsteady while standing and ambulating and needs mild steadying assist to correct. Pt is expected to require O2 post discharge given his current condition, but will continue to monitor during his stay. Given his situation at home and requiring < 3 hours of therapy, SNF is recommended to allow pt to progress mobility and for wound to heal properly. PT to follow acutely.       If plan is discharge home, recommend the following: Help with stairs or ramp for entrance;Assistance with cooking/housework;A little help with walking and/or transfers;Direct supervision/assist for financial management   Can travel by private vehicle   Yes    Equipment Recommendations None recommended by PT  Recommendations for Other Services       Functional Status Assessment Patient has had a recent decline in their functional status and demonstrates the ability to make significant improvements in function in a reasonable and predictable amount of time.     Precautions / Restrictions Precautions Precautions: Fall;Other (comment) Precaution/Restrictions Comments: Post-op shoe on L foot Other Brace: Post-op shoe Restrictions Weight Bearing Restrictions Per Provider Order: Yes LLE Weight Bearing Per Provider Order: Weight bearing as tolerated      Mobility  Bed Mobility Overal bed mobility: Needs Assistance Bed Mobility: Supine to Sit      Supine to sit: Supervision     General bed mobility comments: For safety    Transfers Overall transfer level: Needs assistance Equipment used: Rolling walker (2 wheels) Transfers: Sit to/from Stand Sit to Stand: Contact guard assist           General transfer comment: Steadying when rising    Ambulation/Gait Ambulation/Gait assistance: Contact guard assist Gait Distance (Feet): 20 Feet Assistive device: Rolling walker (2 wheels) Gait Pattern/deviations: Step-through pattern, Antalgic, Decreased stride length Gait velocity: decreased     General Gait Details: Pt WBAT on LLE and can steady self using RW.  Stairs            Wheelchair Mobility     Tilt Bed    Modified Rankin (Stroke Patients Only)       Balance Overall balance assessment: Needs assistance Sitting-balance support: Feet supported, Single extremity supported Sitting balance-Leahy Scale: Good       Standing balance-Leahy Scale: Fair Standing balance comment: Requires use of RW for steadying                             Pertinent Vitals/Pain Pain Assessment Pain Assessment: 0-10 Pain Score: 4  Pain Location: L Foot Pain Descriptors / Indicators: Aching Pain Intervention(s): Limited activity within patient's tolerance, Monitored during session    Home Living Family/patient expects to be discharged to:: Private residence Living Arrangements: Alone Available Help at Discharge: Neighbor;Other (Comment) (Inconsistent story on who would truly be available post discharge) Type of Home: Mobile home Home  Access: Ramped entrance       Home Layout: One level Home Equipment: Agricultural consultant (2 wheels);Wheelchair - manual;Cane - single point      Prior Function Prior Level of Function : Independent/Modified Independent;Driving                     Extremity/Trunk Assessment   Upper Extremity Assessment Upper Extremity Assessment: Overall WFL for tasks assessed     Lower Extremity Assessment Lower Extremity Assessment: LLE deficits/detail LLE Deficits / Details: s/p L great toe amp    Cervical / Trunk Assessment Cervical / Trunk Assessment: Normal  Communication   Communication Communication: No apparent difficulties    Cognition Arousal: Alert Behavior During Therapy: WFL for tasks assessed/performed   PT - Cognitive impairments: No apparent impairments                         Following commands: Intact       Cueing Cueing Techniques: Verbal cues, Gestural cues     General Comments General comments (skin integrity, edema, etc.): SpO2 94% after activity on 3L, other VSS    Exercises     Assessment/Plan    PT Assessment Patient needs continued PT services  PT Problem List Pain;Decreased activity tolerance;Decreased mobility;Decreased knowledge of precautions       PT Treatment Interventions Gait training;Therapeutic exercise;Functional mobility training;Therapeutic activities;Patient/family education    PT Goals (Current goals can be found in the Care Plan section)  Acute Rehab PT Goals Patient Stated Goal: to go home PT Goal Formulation: With patient Time For Goal Achievement: 03/23/24 Potential to Achieve Goals: Good    Frequency Min 2X/week     Co-evaluation               AM-PAC PT 6 Clicks Mobility  Outcome Measure Help needed turning from your back to your side while in a flat bed without using bedrails?: A Little Help needed moving from lying on your back to sitting on the side of a flat bed without using bedrails?: A Little Help needed moving to and from a bed to a chair (including a wheelchair)?: A Little Help needed standing up from a chair using your arms (e.g., wheelchair or bedside chair)?: A Little Help needed to walk in hospital room?: A Little Help needed climbing 3-5 steps with a railing? : A Lot 6 Click Score: 17    End of Session Equipment Utilized During Treatment: Gait  belt Activity Tolerance: Patient tolerated treatment well Patient left: in chair;with chair alarm set;with call bell/phone within reach Nurse Communication: Mobility status PT Visit Diagnosis: Difficulty in walking, not elsewhere classified (R26.2);Pain Pain - Right/Left: Left Pain - part of body: Ankle and joints of foot    Time: 8697-8664 PT Time Calculation (min) (ACUTE ONLY): 33 min   Charges:   PT Evaluation $PT Eval Low Complexity: 1 Low PT Treatments $Therapeutic Activity: 8-22 mins PT General Charges $$ ACUTE PT VISIT: 1 Visit         Quintin Campi, SPT  Acute Rehab  [phone]   Quintin Campi 03/09/2024, 3:47 PM

## 2024-03-09 NOTE — ED Notes (Signed)
 Pt had episode of urinary incontinence, writer cleaned up patient and changed sheets

## 2024-03-10 ENCOUNTER — Encounter: Payer: Self-pay | Admitting: *Deleted

## 2024-03-10 ENCOUNTER — Encounter (HOSPITAL_COMMUNITY): Payer: Self-pay | Admitting: Family Medicine

## 2024-03-10 DIAGNOSIS — R0902 Hypoxemia: Secondary | ICD-10-CM | POA: Diagnosis not present

## 2024-03-10 LAB — COMPREHENSIVE METABOLIC PANEL WITH GFR
ALT: 29 U/L (ref 0–44)
AST: 41 U/L (ref 15–41)
Albumin: 1.9 g/dL — ABNORMAL LOW (ref 3.5–5.0)
Alkaline Phosphatase: 70 U/L (ref 38–126)
Anion gap: 8 (ref 5–15)
BUN: 12 mg/dL (ref 8–23)
CO2: 26 mmol/L (ref 22–32)
Calcium: 8.1 mg/dL — ABNORMAL LOW (ref 8.9–10.3)
Chloride: 97 mmol/L — ABNORMAL LOW (ref 98–111)
Creatinine, Ser: 0.9 mg/dL (ref 0.61–1.24)
GFR, Estimated: >60 mL/min (ref >60)
Glucose, Bld: 278 mg/dL — ABNORMAL HIGH (ref 70–99)
Potassium: 3.4 mmol/L — ABNORMAL LOW (ref 3.5–5.1)
Sodium: 131 mmol/L — ABNORMAL LOW (ref 135–145)
Total Bilirubin: 0.5 mg/dL (ref 0.0–1.2)
Total Protein: 5.6 g/dL — ABNORMAL LOW (ref 6.5–8.1)

## 2024-03-10 LAB — CBC
HCT: 33.5 % — ABNORMAL LOW (ref 39.0–52.0)
Hemoglobin: 11.6 g/dL — ABNORMAL LOW (ref 13.0–17.0)
MCH: 31.9 pg (ref 26.0–34.0)
MCHC: 34.6 g/dL (ref 30.0–36.0)
MCV: 92 fL (ref 80.0–100.0)
Platelets: 233 K/uL (ref 150–400)
RBC: 3.64 MIL/uL — ABNORMAL LOW (ref 4.22–5.81)
RDW: 12.4 % (ref 11.5–15.5)
WBC: 6.4 K/uL (ref 4.0–10.5)
nRBC: 0 % (ref 0.0–0.2)

## 2024-03-10 LAB — GLUCOSE, CAPILLARY
Glucose-Capillary: 114 mg/dL — ABNORMAL HIGH (ref 70–99)
Glucose-Capillary: 254 mg/dL — ABNORMAL HIGH (ref 70–99)

## 2024-03-10 MED ORDER — ZOLPIDEM TARTRATE 5 MG PO TABS
2.5000 mg | ORAL_TABLET | Freq: Once | ORAL | Status: AC
  Administered 2024-03-10: 2.5 mg via ORAL
  Filled 2024-03-10: qty 1

## 2024-03-10 MED ORDER — OXYCODONE HCL 5 MG PO TABS
5.0000 mg | ORAL_TABLET | Freq: Once | ORAL | Status: AC
  Administered 2024-03-10: 5 mg via ORAL
  Filled 2024-03-10: qty 1

## 2024-03-10 MED ORDER — OXYCODONE HCL 5 MG PO TABS
5.0000 mg | ORAL_TABLET | ORAL | Status: DC | PRN
  Administered 2024-03-10: 5 mg via ORAL
  Filled 2024-03-10: qty 1

## 2024-03-10 MED ORDER — ALBUTEROL SULFATE HFA 108 (90 BASE) MCG/ACT IN AERS: 2.0000 | INHALATION_SPRAY | Freq: Four times a day (QID) | RESPIRATORY_TRACT | 2 refills | Status: DC | PRN

## 2024-03-10 MED ORDER — POTASSIUM CHLORIDE 20 MEQ PO PACK
20.0000 meq | PACK | Freq: Once | ORAL | Status: AC
  Administered 2024-03-10: 20 meq via ORAL
  Filled 2024-03-10: qty 1

## 2024-03-10 MED ORDER — GUAIFENESIN ER 600 MG PO TB12
600.0000 mg | ORAL_TABLET | Freq: Once | ORAL | Status: AC
  Administered 2024-03-10: 600 mg via ORAL
  Filled 2024-03-10: qty 1

## 2024-03-10 MED ORDER — BUDESONIDE-FORMOTEROL FUMARATE 80-4.5 MCG/ACT IN AERO: 2.0000 | INHALATION_SPRAY | Freq: Every day | RESPIRATORY_TRACT | 0 refills | Status: DC

## 2024-03-10 MED ORDER — DM-GUAIFENESIN ER 30-600 MG PO TB12: 1.0000 | ORAL_TABLET | Freq: Two times a day (BID) | ORAL | 0 refills | Status: DC

## 2024-03-10 NOTE — Discharge Summary (Addendum)
 Physician Discharge Summary   Patient: [name] MRN: [id number] DOB: [birth year]  Admit date:     03/08/2024  Discharge date: 03/10/24  Discharge Physician: Deliliah Room   PCP: Norleen Lynwood LELON, MD   Recommendations at discharge:    Follow up with your PCP in one week Stop smoking Out patient follow up with pulmonology to get PFTs. Take meds as prescribed  Discharge Diagnoses: Principal Problem:   Hypoxemia Active Problems:   Parainfluenza virus infection   Hospital Course:  Patient was admitted with worsening shortness of breath in the setting of parainfluenza virus infection and likely undiagnosed COPD. He does smoke a pack of cigars every day and have been smoking for decades. He was never seen by pulmonology. He was treated with inhalational bronchodilator therapy and clinical condition improved.  He had recent partial L hallux amputation and heel wound debridement. He was seen by Dr Malvin on 7/29 and he did dressing change and recommendation is to follow up win his office in a week for wound recheck and dressing change. He will be staying at his friend's place on discharge. Prescribed both rescue and maintenance inhalers on discharge along with mucinex . He didn't need any supplemental oxygen at discharge.      Consultants: None Procedures performed: None  Disposition: Home Diet recommendation:  Cardiac and Carb modified diet DISCHARGE MEDICATION: Allergies as of 03/10/2024       Reactions   Cefepime  Hives   Had allergic reaction to one of these 3 agents, unclear which one   Metoprolol  Hives   Had allergic reaction to one of these 3 agents, unclear which one *Per RN, highly likely this is the cause of allergic rxn   Myrbetriq  [mirabegron ] Hives   Had allergic reaction to one of these 3 agents, unclear which one        Medication List     TAKE these medications    albuterol  108 (90 Base) MCG/ACT inhaler Commonly known as: VENTOLIN  HFA Inhale 2 puffs  into the lungs every 6 (six) hours as needed for wheezing or shortness of breath.   amoxicillin -clavulanate 875-125 MG tablet Commonly known as: AUGMENTIN  Take 1 tablet by mouth 2 (two) times daily.   aspirin  EC 81 MG tablet Take 1 tablet (81 mg total) by mouth daily. Swallow whole.   atorvastatin  40 MG tablet Commonly known as: LIPITOR  TAKE 1 TABLET BY MOUTH ONCE DAILY   budesonide -formoterol  80-4.5 MCG/ACT inhaler Commonly known as: Symbicort  Inhale 2 puffs into the lungs daily.   citalopram  20 MG tablet Commonly known as: CELEXA  Take 1 tablet (20 mg total) by mouth daily.   clopidogrel  75 MG tablet Commonly known as: Plavix  Take 1 tablet (75 mg total) by mouth daily.   dextromethorphan -guaiFENesin  30-600 MG 12hr tablet Commonly known as: MUCINEX  DM Take 1 tablet by mouth 2 (two) times daily.   irbesartan  300 MG tablet Commonly known as: AVAPRO  Take 1 tablet (300 mg total) by mouth daily.   loratadine  10 MG tablet Commonly known as: CLARITIN  Take 10 mg by mouth daily as needed.   pantoprazole  40 MG tablet Commonly known as: PROTONIX  Take 1 tablet (40 mg total) by mouth daily.   Toujeo  Max SoloStar 300 UNIT/ML Solostar Pen Generic drug: insulin  glargine (2 Unit Dial ) Inject 35 Units into the skin every morning.        Follow-up Information     Norleen Lynwood LELON, MD. Call in 1 week(s).   Specialties: Internal Medicine, Radiology Contact information: [phone]  [place] [address] [phone]         [facility]. Call in 1 month(s).   Specialty: Pulmonology Contact information: [address] [place]  [address] [phone] Additional information: [address]  [address]  [place], [place] [address]               Discharge Exam: Fredricka Weights   03/10/24 0500  Weight: 72.5 kg   Constitutional: NAD, calm, comfortable Eyes: PERRL, lids and conjunctivae normal ENMT:  Mucous membranes are moist. Posterior pharynx clear of any exudate or lesions.Normal dentition.  Neck: normal, supple, no masses, no thyromegaly Respiratory: clear to auscultation bilaterally, no wheezing, no crackles. Normal respiratory effort. No accessory muscle use.  Cardiovascular: Regular rate and rhythm, no murmurs / rubs / gallops. No extremity edema. 2+ pedal pulses. No carotid bruits.  Abdomen: no tenderness, no masses palpated. No hepatosplenomegaly. Bowel sounds positive.  Musculoskeletal: no clubbing / cyanosis. No joint deformity upper and lower extremities. Good ROM, no contractures. Normal muscle tone.  Skin: no rashes, lesions, ulcers. No induration Neurologic: CN 2-12 grossly intact. Sensation intact, DTR normal. Strength 5/5 x all 4 extremities.  Psychiatric: Normal judgment and insight. Alert and oriented x 3. Normal mood.    Condition at discharge: good  The results of significant diagnostics from this hospitalization (including imaging, microbiology, ancillary and laboratory) are listed below for reference.   Imaging Studies: CT Angio Chest PE W and/or Wo Contrast Result Date: 03/09/2024 CLINICAL DATA:  Shortness of breath EXAM: CT ANGIOGRAPHY CHEST WITH CONTRAST TECHNIQUE: Multidetector CT imaging of the chest was performed using the standard protocol during bolus administration of intravenous contrast. Multiplanar CT image reconstructions and MIPs were obtained to evaluate the vascular anatomy. RADIATION DOSE REDUCTION: This exam was performed according to the departmental dose-optimization program which includes automated exposure control, adjustment of the mA and/or kV according to patient size and/or use of iterative reconstruction technique. CONTRAST:  75mL OMNIPAQUE  IOHEXOL  350 MG/ML SOLN COMPARISON:  Chest x-ray from earlier in the same day. CT from 06/05/2019 FINDINGS: Cardiovascular: Atherosclerotic calcifications of the thoracic aorta are seen. No aneurysmal  dilatation is noted. The pulmonary artery shows a normal branching pattern bilaterally. No intraluminal filling defect to suggest pulmonary embolism is seen. Coronary calcifications are noted. No cardiac enlargement is seen. Mediastinum/Nodes: Thoracic inlet is within normal limits. Scattered small mediastinal nodes are seen. One dominant node is noted in the precarinal region measuring up to 15 mm. The esophagus as visualized is within normal limits. Lungs/Pleura: Lungs are well aerated bilaterally. No focal infiltrate or sizable effusion is seen. No pneumothorax is noted. Mild emphysematous changes are noted. Small less than 5 mm parenchymal nodules are noted in the lower lobes bilaterally. No sizable parenchymal nodule is seen. Upper Abdomen: Visualized upper abdomen shows no acute abnormality. Musculoskeletal: Degenerative changes of the thoracic spine are noted. No acute rib abnormality is noted. Review of the MIP images confirms the above findings. IMPRESSION: No evidence of pulmonary emboli. Small parenchymal nodules in the lower lobes bilaterally. No follow-up needed if patient is low-risk (and has no known or suspected primary neoplasm). Non-contrast chest CT can be considered in 12 months if patient is high-risk. This recommendation follows the consensus statement: Guidelines for Management of Incidental Pulmonary Nodules Detected on CT Images: From the Fleischner Society 2017; Radiology 2017; 284:228-243. Scattered mediastinal and hilar nodes. The largest of these lies in the precarinal region. These  may simply be reactive in nature. Short-term follow-up in 3-6 months is recommended to assess for stability/resolution. Aortic Atherosclerosis (ICD10-I70.0) and Emphysema (ICD10-J43.9). Electronically Signed   By: Oneil Devonshire M.D.   On: 03/09/2024 03:36   DG Chest 2 View Result Date: 03/09/2024 CLINICAL DATA:  Shortness of breath EXAM: CHEST - 2 VIEW COMPARISON:  01/13/2023 FINDINGS: Mild hyperinflation.  Heart and mediastinal contours are within normal limits. No focal opacities or effusions. No acute bony abnormality. IMPRESSION: Hyperinflation.  No active cardiopulmonary disease. Electronically Signed   By: Franky Crease M.D.   On: 03/09/2024 00:34   VAS US  LOWER EXTREMITY ARTERIAL DUPLEX Result Date: 03/01/2024 LOWER EXTREMITY ARTERIAL DUPLEX STUDY Patient Name:  ELIBERTO SOLE  Date of Exam:   03/01/2024 Medical Rec #: [id number]      Accession #:    [id number] Date of Birth: [birth year]      Patient Gender: M Patient Age:   71 years Exam Location:  [facility] Procedure:      VAS US  LOWER EXTREMITY ARTERIAL DUPLEX Referring Phys: TERETHA DAMME --------------------------------------------------------------------------------  Indications: Peripheral artery disease. High Risk Factors: Hypertension, hyperlipidemia, Diabetes, coronary artery                    disease.  Vascular Interventions: 02/29/24 - LEFT GREAT TOE AMPUTATION, 02/15/24 -LEFT 5TH                         TOE AMPUTATION.                         02/15/24 Left SFA/knee popliteal artery religning with new                         stent. Current ABI:            Right 0.55 - Left 0.88 Performing Technologist: Ricka Sturdivant-Jones RDMS, RVT  Examination Guidelines: A complete evaluation includes B-mode imaging, spectral Doppler, color Doppler, and power Doppler as needed of all accessible portions of each vessel. Bilateral testing is considered an integral part of a complete examination. Limited examinations for reoccurring indications may be performed as noted.   +-----------+--------+-----+---------------+----------+--------+ LEFT       PSV cm/sRatioStenosis       Waveform  Comments +-----------+--------+-----+---------------+----------+--------+ CFA Mid    178          30-49% stenosisbiphasic           +-----------+--------+-----+---------------+----------+--------+ CFA Distal 196          30-49% stenosismonophasic          +-----------+--------+-----+---------------+----------+--------+ DFA        65                          biphasic           +-----------+--------+-----+---------------+----------+--------+ SFA Prox   184          30-49% stenosismonophasic         +-----------+--------+-----+---------------+----------+--------+ SFA Mid    138                         monophasic         +-----------+--------+-----+---------------+----------+--------+ POP Prox   162                         monophasic         +-----------+--------+-----+---------------+----------+--------+  ATA Prox   138                         monophasic         +-----------+--------+-----+---------------+----------+--------+ ATA Mid    89                          monophasic         +-----------+--------+-----+---------------+----------+--------+ ATA Distal 102                         monophasic         +-----------+--------+-----+---------------+----------+--------+ PTA Prox   91                          monophasic         +-----------+--------+-----+---------------+----------+--------+ PTA Distal 78                          monophasic         +-----------+--------+-----+---------------+----------+--------+ PERO Prox  66                          monophasic         +-----------+--------+-----+---------------+----------+--------+ PERO Distal59                          monophasic         +-----------+--------+-----+---------------+----------+--------+  Left Stent(s): +---------------+---++----------++ Prox to Stent  +---------------+---++----------++ Proximal Stent +---------------+---++----------++ Mid Stent      +---------------+---++----------++ Distal Stent   +---------------+---++----------++ Distal to Stent70monophasic +---------------+---++----------++    Summary: Left: 30-49% stenosis noted in the common  femoral artery. 30-49% stenosis noted in the superficial femoral artery. Patent left SFA/above-knee popliteal stent.  See table(s) above for measurements and observations. Electronically signed by Norman Serve on 03/01/2024 at 3:26:40 PM.    Final    VAS US  ABI WITH/WO TBI Result Date: 03/01/2024  LOWER EXTREMITY DOPPLER STUDY Patient Name:  ZEV BLUE  Date of Exam:   03/01/2024 Medical Rec #: [id number]      Accession #:    [id number] Date of Birth: 1955-11-03      Patient Gender: M Patient Age:   41 years Exam Location:  [facility] Procedure:      VAS US  ABI WITH/WO TBI Referring Phys: TERETHA DAMME --------------------------------------------------------------------------------  Indications: Peripheral artery disease. High Risk Factors: Hypertension, hyperlipidemia, Diabetes.  Vascular Interventions: 02/29/24 - LEFT GREAT TOE AMPUTATION, 02/15/24 -LEFT 5TH                         TOE AMPUTATION.                         02/15/24 Left SFA/knee popliteal artery religning with new                         stent. Performing Technologist: Ricka Sturdivant-Jones RDMS, RVT  Examination Guidelines: A complete evaluation includes at minimum, Doppler waveform signals and systolic blood pressure reading at the level of bilateral brachial, anterior tibial, and posterior tibial arteries, when vessel segments are accessible. Bilateral testing is considered an integral part of a complete examination. Photoelectric Plethysmograph (PPG) waveforms and toe  systolic pressure readings are included as required and additional duplex testing as needed. Limited examinations for reoccurring indications may be performed as noted.  ABI Findings: +---------+------------------+-----+-----------------+--------+ Right    Rt Pressure (mmHg)IndexWaveform         Comment  +---------+------------------+-----+-----------------+--------+ Brachial 137                    triphasic                  +---------+------------------+-----+-----------------+--------+ PTA      81                0.55 monophasic                +---------+------------------+-----+-----------------+--------+ DP       74                0.50 monophasic                +---------+------------------+-----+-----------------+--------+ Great Toe                       severely dampened         +---------+------------------+-----+-----------------+--------+ +--------+------------------+-----+----------+-------+ Left    Lt Pressure (mmHg)IndexWaveform  Comment +--------+------------------+-----+----------+-------+ [id number]                                      +--------+------------------+-----+----------+-------+ PTA     130               0.88 biphasic          +--------+------------------+-----+----------+-------+ DP      108               0.73 monophasic        +--------+------------------+-----+----------+-------+ +-------+-----------+-----------+------------+------------+ ABI/TBIToday's ABIToday's TBIPrevious ABIPrevious TBI +-------+-----------+-----------+------------+------------+ Right  0.55       NA                                  +-------+-----------+-----------+------------+------------+ Left   0.88       NA                                  +-------+-----------+-----------+------------+------------+  Right ABIs appear increased compared to prior study on 02/10/24. Left ABIs appear increased compared to prior study on 02/10/24.  Summary: Right: Resting right ankle-brachial index indicates moderate right lower extremity arterial disease. Left: Resting left ankle-brachial index indicates mild left lower extremity arterial disease. *See table(s) above for measurements and observations.  Electronically signed by Norman Serve on 03/01/2024 at 3:26:30 PM.    Final    DG Foot 2 Views Left Result Date: 02/29/2024 CLINICAL DATA:  Status post great toe amputation EXAM: LEFT FOOT - 2  VIEW COMPARISON:  Left foot radiograph dated 02/27/2024 FINDINGS: Postsurgical changes of the great toe amputation at the mid proximal phalanx. Prior amputation of the fifth toe at the metatarsal phalangeal joint. Plantar and dorsal calcaneal spur. IMPRESSION: Postsurgical changes of the great toe amputation at the mid proximal phalanx. Electronically Signed   By: Limin  Xu M.D.   On: 02/29/2024 11:25   DG Foot Complete Left Result Date: 02/27/2024 EXAM: 3 or more VIEW(S) XRAY OF THE LEFT FOOT 02/27/2024 07:23:23 PM COMPARISON: Outside facility radiographs dated 02/22/2024. CLINICAL HISTORY: Great toe  necrosis. FINDINGS: BONES AND JOINTS: Status post fifth digit amputation at the MTP joint. Mild lucency and cortical destruction involving the first distal tuft with associated soft tissue gas, suggesting osteomyelitis. SOFT TISSUES: Soft tissue gas noted in the region of the first distal tuft. IMPRESSION: 1. Suspected osteomyelitis involving the 1st distal tuft. 2. Status post fifth digit amputation at the MTP joint. Electronically signed by: Pinkie Pebbles MD 02/27/2024 07:40 PM EDT RP Workstation: [id number]   DG Foot Complete Left Result Date: 02/22/2024 Please see detailed radiograph report in office note.  VAS US  LOWER EXTREMITY ARTERIAL DUPLEX Result Date: 02/15/2024 LOWER EXTREMITY ARTERIAL DUPLEX STUDY Patient Name:  AKIEL FENNELL  Date of Exam:   02/15/2024 Medical Rec #: [id number]      Accession #:    [id number] Date of Birth: [birth year]      Patient Gender: M Patient Age:   20 years Exam Location:  [facility] Procedure:      VAS US  LOWER EXTREMITY ARTERIAL DUPLEX Referring Phys: NORMAN SERVE --------------------------------------------------------------------------------  Indications: Peripheral artery disease. High Risk Factors: Hypertension, hyperlipidemia, current smoker, coronary artery                    disease. Other Factors: Afib, LLE 5th toe amputation 01/02/2024.  Vascular  Interventions: LLE angiogram w/ balloon angioplasty of left SFA/ above                         knee popliteal artery and religning with new                         stent(02/15/2024). LLE angiogram with SFA stent placement                         on 01/01/2024. Current ABI:            0.50/0.32 - prior to intervention of 02/15/2024 Limitations: Scannig conditions (backwards with lights on) Comparison Study: Previous exam on 02/10/2024 50-74% of CFA & right SFA stent                   occlusion. Performing Technologist: Ezzie Potters RVT, RDMS  Examination Guidelines: A complete evaluation includes B-mode imaging, spectral Doppler, color Doppler, and power Doppler as needed of all accessible portions of each vessel. Bilateral testing is considered an integral part of a complete examination. Limited examinations for reoccurring indications may be performed as noted.   Right Stent(s): +---------------+---+---------------+----------++ Prox to Stent  127               monophasic +---------------+---+---------------+----------++ Proximal Stent 125               monophasic +---------------+---+---------------+----------++ Mid Stent      30350-99% stenosismonophasic +---------------+---+---------------+----------++ Distal Stent   33650-99% stenosismonophasic +---------------+---+---------------+----------++ Distal to [id number]               monophasic +---------------+---+---------------+----------++    +-----------+--------+-----+---------------+----------+--------+ LEFT       PSV cm/sRatioStenosis       Waveform  Comments +-----------+--------+-----+---------------+----------+--------+ CFA Prox   118                         biphasic           +-----------+--------+-----+---------------+----------+--------+ CFA Mid    107  biphasic           +-----------+--------+-----+---------------+----------+--------+ CFA Distal 255          50-74% stenosismonophasic          +-----------+--------+-----+---------------+----------+--------+ DFA        68                          biphasic           +-----------+--------+-----+---------------+----------+--------+ SFA Prox   258          50-74% stenosismonophasic         +-----------+--------+-----+---------------+----------+--------+ POP Prox   107                         monophasic         +-----------+--------+-----+---------------+----------+--------+ POP Mid    99                          monophasic         +-----------+--------+-----+---------------+----------+--------+ POP Distal 100                         monophasic         +-----------+--------+-----+---------------+----------+--------+ TP Trunk   117                         monophasic         +-----------+--------+-----+---------------+----------+--------+ ATA Prox   107                         monophasic         +-----------+--------+-----+---------------+----------+--------+ ATA Mid    134                         monophasic         +-----------+--------+-----+---------------+----------+--------+ ATA Distal 120                         monophasic         +-----------+--------+-----+---------------+----------+--------+ PTA Prox   75                          monophasic         +-----------+--------+-----+---------------+----------+--------+ PTA Mid    143                         monophasic         +-----------+--------+-----+---------------+----------+--------+ PTA Distal 126                         monophasic         +-----------+--------+-----+---------------+----------+--------+ PERO Prox  69                          monophasic         +-----------+--------+-----+---------------+----------+--------+ PERO Mid   63                          monophasic         +-----------+--------+-----+---------------+----------+--------+ PERO Distal51  monophasic          +-----------+--------+-----+---------------+----------+--------+ DP         64                          monophasic         +-----------+--------+-----+---------------+----------+--------+  Summary: Right: 50-74% stenosis noted in the common femoral artery. 50-74% stenosis noted in the proximal superficial femoral artery. Stenosis is noted within the mid and distal portion of SFA stent.  See table(s) above for measurements and observations. Electronically signed by Fonda Rim on 02/15/2024 at 5:47:50 PM.    Final    PERIPHERAL VASCULAR CATHETERIZATION Result Date: 02/15/2024 Images from the original result were not included. 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 Bartnick is a 68 year old male with PAD who recently underwent left SFA stenting on 01/01/2024 with Dr. Sheree.  Due to his financial and social issues he is compliant with medications and presented to the office on 7/2 where he was noted to have worsening discoloration of his left foot with skin changes to his left great toe and his noninvasive studies demonstrated a severely depressed ABI with an occluded left SFA stent on duplex.  Risks and benefits of repeat angiogram with intervention were reviewed, he expressed understanding and elected to proceed. Findings: Widely patent aorta and paired renal arteries bilaterally. The distal aorta and common iliac arteries are calcified but do not appear to have any flow-limiting stenosis. On the left,  the common femoral artery, profunda and proximal segment are widely patent.  The stent to the mid SFA is occluded with reconstitution shortly after the distal stent edge.  The popliteal artery is widely patent.  There is three-vessel runoff with dominant PT and AT flow. On the right the common femoral artery and profunda are widely patent.  There is a flush CTO of the SFA.  The above-knee popliteal is reconstituted in the popliteal artery is widely patent.  There is three-vessel runoff with dominant PT and AT flow.  Procedure:  The patient was identified in the holding area and taken to the cath lab  The patient was then placed supine on the table and prepped and draped in the usual sterile fashion.  A time out was called.  Ultrasound was used to evaluate the right common femoral artery.  It was patent .  A digital ultrasound image was acquired.  A micropuncture needle was used to access the right common femoral artery under ultrasound guidance.  An 018 wire was advanced without resistance and a micropuncture sheath was placed.  The 018 wire was removed and a benson wire was placed.  The micropuncture sheath was exchanged for a 5 french sheath.  An omniflush catheter was advanced over the wire to the level of L-1.  An abdominal angiogram was obtained.  Next, using the omniflush catheter and a Bentson wire, the aortic bifurcation was crossed and the catheter was placed into theleft external iliac artery and left runoff was obtained. This demonstrated the above findings.  A glide  advantage wire was placed through the Omni Flush catheter and into the SFA.  The short 5 French sheath was then exchanged for a 6 [place] by 45 cm catapult sheath and the patient was systemically heparinized.  Using the glide advantage wire and a quick cross catheter I was able to cross the occluded stents.  An angiogram via the quick cross catheter demonstrated true lumen crossing wire and patency of the popliteal artery.  This was then  treated by realigning the stent with a 6 mm x 150 mm Eluvia stent which was postdilated with a 5 mm Mustang balloon.  A completion angiogram demonstrated wide patency with minimal residual stenosis and preserved three-vessel runoff.  The long 6 French sheath was then exchanged for a short 6 [place] sheath and a glide advantage wire was removed.  right runoff was performed via retrograde sheath injections which demonstrated the above findings. Contrast: 60 cc Sedation: 32 minutes Impression: Successful recanalization of the occluded left SFA stent and relining with a new 6 mm Eluvia stent. Right lower extremity with a necessitate a femoral to above-knee popliteal artery bypass Norman GORMAN Serve MD [facility] Office: [phone]  VAS US  LOWER EXTREMITY ARTERIAL DUPLEX Result Date: 02/10/2024 LOWER EXTREMITY ARTERIAL DUPLEX STUDY Patient Name:  WELLS MABE  Date of Exam:   02/10/2024 Medical Rec #: [id number]      Accession #:    [id number] Date of Birth: 1955-11-23      Patient Gender: M Patient Age:   64 years Exam Location:  [address] Procedure:      VAS US  LOWER EXTREMITY ARTERIAL DUPLEX Referring Phys: PENNE COLORADO --------------------------------------------------------------------------------  Indications: Peripheral artery disease. High Risk Factors: Hypertension, Diabetes, current smoker.  Vascular Interventions: 01/01/24 - Left LE Angiography. Current ABI:            0.50/0.32 Performing Technologist: Garnette Rockers  Examination Guidelines: A complete evaluation includes B-mode imaging, spectral Doppler, color Doppler, and power Doppler as needed of all accessible portions of each vessel. Bilateral testing is considered an integral part of a complete examination. Limited examinations for reoccurring indications may be performed as noted.   +-----------+--------+-----+---------------+----------+--------+ LEFT       PSV cm/sRatioStenosis       Waveform  Comments  +-----------+--------+-----+---------------+----------+--------+ CFA Prox   133                                            +-----------+--------+-----+---------------+----------+--------+ CFA Distal 277          50-74% stenosistriphasic          +-----------+--------+-----+---------------+----------+--------+ DFA        149                         triphasic          +-----------+--------+-----+---------------+----------+--------+ SFA Prox   62                          biphasic           +-----------+--------+-----+---------------+----------+--------+ SFA Mid    28                          monophasic         +-----------+--------+-----+---------------+----------+--------+ SFA Distal 59  monophasic         +-----------+--------+-----+---------------+----------+--------+ POP Prox   41                          monophasic         +-----------+--------+-----+---------------+----------+--------+ POP Distal 23                          monophasic         +-----------+--------+-----+---------------+----------+--------+ ATA Distal 24                          monophasic         +-----------+--------+-----+---------------+----------+--------+ PTA Distal 18                          monophasic         +-----------+--------+-----+---------------+----------+--------+ PERO Distal11                          monophasic         +-----------+--------+-----+---------------+----------+--------+  Left Stent(s): +------------------+--------+--------+----------+--------+ SFA Mid - SFA DistPSV cm/sStenosisWaveform  Comments +------------------+--------+--------+----------+--------+ Prox to Stent     27              monophasic         +------------------+--------+--------+----------+--------+ Proximal Stent            occluded                   +------------------+--------+--------+----------+--------+ Mid Stent                  occluded                   +------------------+--------+--------+----------+--------+ Distal Stent              occluded                   +------------------+--------+--------+----------+--------+ Distal to Stent   59              monophasic         +------------------+--------+--------+----------+--------+ Stent occluded, but distal flow is reconstituted just past stent.   Summary: Left: 50-74% stenosis noted in the common femoral artery. Occlusion is noted within the SFA Mid-Distal stent.  See table(s) above for measurements and observations. Electronically signed by Gaile New MD on 02/10/2024 at 9:58:45 PM.    Final    VAS US  ABI WITH/WO TBI Result Date: 02/10/2024  LOWER EXTREMITY DOPPLER STUDY Patient Name:  JEROL RUFENER  Date of Exam:   02/10/2024 Medical Rec #: [id number]      Accession #:    [id number] Date of Birth: 1956/05/19      Patient Gender: M Patient Age:   75 years Exam Location:  [address] Procedure:      VAS US  ABI WITH/WO TBI Referring Phys: PENNE COLORADO --------------------------------------------------------------------------------  Indications: Peripheral artery disease. High Risk Factors: Hypertension, Diabetes. Other Factors: 01/01/24 - Left LE Angiography.  Comparison Study: 12/31/23 Performing Technologist: Garnette Rockers  Examination Guidelines: A complete evaluation includes at minimum, Doppler waveform signals and systolic blood pressure reading at the level of bilateral brachial, anterior tibial, and posterior tibial arteries, when vessel segments are accessible. Bilateral testing is considered an integral part of a complete examination. Photoelectric Plethysmograph (PPG) waveforms and toe systolic pressure readings are included as required  and additional duplex testing as needed. Limited examinations for reoccurring indications may be performed as noted.  ABI Findings: +---------+------------------+-----+-------------------+--------+ Right    Rt Pressure  (mmHg)IndexWaveform           Comment  +---------+------------------+-----+-------------------+--------+ Brachial 173                                                +---------+------------------+-----+-------------------+--------+ PTA      85                0.49 dampened monophasic         +---------+------------------+-----+-------------------+--------+ DP       86                0.50 dampened monophasic         +---------+------------------+-----+-------------------+--------+ Great Toe32                0.18 Abnormal                    +---------+------------------+-----+-------------------+--------+ +---------+------------------+-----+-------------------+-------+ Left     Lt Pressure (mmHg)IndexWaveform           Comment +---------+------------------+-----+-------------------+-------+ Brachial 171                                               +---------+------------------+-----+-------------------+-------+ PTA      55                0.32 dampened monophasic        +---------+------------------+-----+-------------------+-------+ DP       36                0.21 dampened monophasic        +---------+------------------+-----+-------------------+-------+ Great Toe0                 0.00 Absent                     +---------+------------------+-----+-------------------+-------+ +-------+-----------+-----------+------------+------------+ ABI/TBIToday's ABIToday's TBIPrevious ABIPrevious TBI +-------+-----------+-----------+------------+------------+ Right  0.50       0.18       0.41        0            +-------+-----------+-----------+------------+------------+ Left   0.32       0          0.89        0            +-------+-----------+-----------+------------+------------+  Left ABIs appear decreased compared to prior study on 12/31/23.  Summary: Right: Resting right ankle-brachial index indicates moderate right lower extremity arterial disease.  The right toe-brachial index is abnormal. Left: Resting left ankle-brachial index indicates severe left lower extremity arterial disease. The left toe-brachial index is abnormal. *See table(s) above for measurements and observations.  Electronically signed by Gaile New MD on 02/10/2024 at 9:58:18 PM.    Final     Microbiology: Results for orders placed or performed during the hospital encounter of 03/08/24  Resp panel by RT-PCR (RSV, Flu A&B, Covid) Anterior Nasal Swab     Status: None   Collection Time: 03/09/24  5:00 AM   Specimen: Anterior Nasal Swab  Result Value Ref Range Status   SARS Coronavirus 2 by RT PCR NEGATIVE NEGATIVE  Final   Influenza A by PCR NEGATIVE NEGATIVE Final   Influenza B by PCR NEGATIVE NEGATIVE Final    Comment: (NOTE) The Xpert Xpress SARS-CoV-2/FLU/RSV plus assay is intended as an aid in the diagnosis of influenza from Nasopharyngeal swab specimens and should not be used as a sole basis for treatment. Nasal washings and aspirates are unacceptable for Xpert Xpress SARS-CoV-2/FLU/RSV testing.  Fact Sheet for Patients: BloggerCourse.com  Fact Sheet for Healthcare Providers: SeriousBroker.it  This test is not yet approved or cleared by the [place]  FDA and has been authorized for detection and/or diagnosis of SARS-CoV-2 by FDA under an Emergency Use Authorization (EUA). This EUA will remain in effect (meaning this test can be used) for the duration of the COVID-19 declaration under Section 564(b)(1) of the Act, 21 U.S.C. section 360bbb-3(b)(1), unless the authorization is terminated or revoked.     Resp Syncytial Virus by PCR NEGATIVE NEGATIVE Final    Comment: (NOTE) Fact Sheet for Patients: BloggerCourse.com  Fact Sheet for Healthcare Providers: SeriousBroker.it  This test is not yet approved or cleared by the [place]  FDA and has been  authorized for detection and/or diagnosis of SARS-CoV-2 by FDA under an Emergency Use Authorization (EUA). This EUA will remain in effect (meaning this test can be used) for the duration of the COVID-19 declaration under Section 564(b)(1) of the Act, 21 U.S.C. section 360bbb-3(b)(1), unless the authorization is terminated or revoked.  Performed at [facility], [address], [place], [place] [address]   Respiratory (~20 pathogens) panel by PCR     Status: Abnormal   Collection Time: 03/09/24  5:00 AM   Specimen: Anterior Nasal Swab; Respiratory  Result Value Ref Range Status   Adenovirus NOT DETECTED NOT DETECTED Final   Coronavirus 229E NOT DETECTED NOT DETECTED Final    Comment: (NOTE) The Coronavirus on the Respiratory Panel, DOES NOT test for the novel  Coronavirus (2019 nCoV)    Coronavirus HKU1 NOT DETECTED NOT DETECTED Final   Coronavirus NL63 NOT DETECTED NOT DETECTED Final   Coronavirus OC43 NOT DETECTED NOT DETECTED Final   Metapneumovirus NOT DETECTED NOT DETECTED Final   Rhinovirus / Enterovirus NOT DETECTED NOT DETECTED Final   Influenza A NOT DETECTED NOT DETECTED Final   Influenza B NOT DETECTED NOT DETECTED Final   Parainfluenza Virus 1 NOT DETECTED NOT DETECTED Final   Parainfluenza Virus 2 NOT DETECTED NOT DETECTED Final   Parainfluenza Virus 3 DETECTED (A) NOT DETECTED Final   Parainfluenza Virus 4 NOT DETECTED NOT DETECTED Final   Respiratory Syncytial Virus NOT DETECTED NOT DETECTED Final   Bordetella pertussis NOT DETECTED NOT DETECTED Final   Bordetella Parapertussis NOT DETECTED NOT DETECTED Final   Chlamydophila pneumoniae NOT DETECTED NOT DETECTED Final   Mycoplasma pneumoniae NOT DETECTED NOT DETECTED Final    Comment: Performed at [facility], [address], [place], [place] [address]    Labs: CBC: Recent Labs  Lab 03/04/24 [phone] 03/08/24 2358 03/09/24 0948 03/10/24 0241  WBC 11.1* 9.9 7.7 6.4  NEUTROABS 7.8*  --  5.2  --    HGB 13.2 12.7* 13.1 11.6*  HCT 38.4* 36.5* 36.9* 33.5*  MCV 95.0 93.4 91.6 92.0  PLT 317 273 280 233   Basic Metabolic Panel: Recent Labs  Lab 03/04/24 0929 03/08/24 2358 03/09/24 0948 03/10/24 0241  NA 134* 123* 130* 131*  K 4.3 3.8 3.8 3.4*  CL 98 90* 95* 97*  CO2 28 24 23 26   GLUCOSE  215* 278* 174* 278*  BUN 13 8 7* 12  CREATININE 0.97 0.73 0.69 0.90  CALCIUM  8.8* 8.5* 8.7* 8.1*  MG 1.6*  --   --   --    Liver Function Tests: Recent Labs  Lab 03/09/24 0948 03/10/24 0241  AST 20 41  ALT 18 29  ALKPHOS 72 70  BILITOT 0.5 0.5  PROT 6.6 5.6*  ALBUMIN 2.3* 1.9*   CBG: Recent Labs  Lab 03/09/24 0907 03/09/24 1238 03/09/24 1611 03/09/24 2211 03/10/24 0748  GLUCAP 187* 294* 247* 194* 114*    Discharge time spent: 43 minutes.  Signed: Deliliah Room, MD [facility] 03/10/2024

## 2024-03-10 NOTE — Progress Notes (Signed)
 Patient ambulated around unit on room air with mobility, (approx. 80 feet) oxygen saturation 95% with ambulation

## 2024-03-10 NOTE — Telephone Encounter (Signed)
 I am not able as pt is currently in hospital; the request should be made by the physicians of record at the hospital.  thanks

## 2024-03-10 NOTE — Progress Notes (Signed)
 LLE ( foot ) wound care and dressing change done per provider's order. Patient tolerated it well.

## 2024-03-10 NOTE — TOC Transition Note (Signed)
 Transition of Care [facility]) - Discharge Note   Patient Details  Name: [name] MRN: [id number] Date of Birth: 05-22-1956  Transition of Care [facility]) CM/SW Contact:  Rosaline JONELLE Joe, RN Phone Number: 03/10/2024, 11:44 AM   Clinical Narrative:    I met with the patient at the bedside to discuss patient's discharge to home today.  Patient was provided with [employer] number to call and follow up with the assistance of Hadassah Fila, DSS SW.  Patient states that he was fined by [employer] in regards to using the utilities that is outside of his current bill for approximately 900 dollars.  Patient receives a SS check monthly - payment tomorrow and DSS SW plans to assist patient with establishing a payee in the next 30 days to assist patient with his bill payments monthly.    Patient was walked this morning on RA - documented by RN and did not qualify for home oxygen.  Patient has no power in his trailer since May 2025 and plans to stay with his friend - Silver Ford [phone] who has an established home with running water and electricity.  Ford will provide transportation to home today.  Patient's foot remains in dry dressing and post op shoes.  MD requested to place instructions about follow up with [place] in the AVS.  Bedside nursing will discharge the patient home.  Resources included in the AVS about smoking cessation as well.         Patient Goals and CMS Choice            Discharge Placement                       Discharge Plan and Services Additional resources added to the After Visit Summary for                                       Social Drivers of Health (SDOH) Interventions SDOH Screenings   Food Insecurity: Food Insecurity Present (03/09/2024)  Housing: High Risk (03/09/2024)  Transportation Needs: Unmet Transportation Needs (03/09/2024)  Utilities: At Risk (03/09/2024)  Alcohol Screen: Low Risk  (06/10/2023)  Depression  (PHQ2-9): Low Risk  (02/22/2024)  Financial Resource Strain: Medium Risk (02/17/2024)  Physical Activity: Inactive (06/10/2023)  Social Connections: Socially Isolated (03/09/2024)  Stress: Stress Concern Present (02/17/2024)  Tobacco Use: High Risk (02/28/2024)  Health Literacy: Inadequate Health Literacy (02/17/2024)     Readmission Risk Interventions    03/09/2024    3:21 PM 12/30/2023    1:17 PM  Readmission Risk Prevention Plan  Post Dischage Appt  Complete  Medication Screening  Complete  Transportation Screening Complete Complete  PCP or Specialist Appt within 5-7 Days Complete   Home Care Screening Complete   Medication Review (RN CM) Complete

## 2024-03-10 NOTE — Progress Notes (Signed)
Patient had a shower 

## 2024-03-10 NOTE — Progress Notes (Signed)
 Mobility Specialist: Progress Note   03/10/24 1100  Mobility  Activity Ambulated with assistance  Level of Assistance Contact guard assist, steadying assist  Assistive Device Front wheel walker  Distance Ambulated (ft) 80 ft  LLE Weight Bearing Per Provider Order WBAT  Activity Response Tolerated well  Mobility Referral Yes  Mobility visit 1 Mobility  Mobility Specialist Start Time (ACUTE ONLY) 1052  Mobility Specialist Stop Time (ACUTE ONLY) 1110  Mobility Specialist Time Calculation (min) (ACUTE ONLY) 18 min    Pt received on EOB, agreeable to mobility session. C/o 5/10 L foot pain. MS donned post op shoe. Ambulated down the hallway with CGA. Returned to room without fault. Left on EOB with all needs met, call bell in reach.   Ileana Lute Mobility Specialist Please contact via SecureChat or Rehab office at [phone]

## 2024-03-10 NOTE — Progress Notes (Signed)
 Patient was pre - medicated with ordered pain medicine prior his dressing change.

## 2024-03-11 ENCOUNTER — Telehealth: Payer: Self-pay | Admitting: *Deleted

## 2024-03-11 ENCOUNTER — Telehealth (HOSPITAL_BASED_OUTPATIENT_CLINIC_OR_DEPARTMENT_OTHER): Payer: Self-pay | Admitting: Licensed Clinical Social Worker

## 2024-03-11 ENCOUNTER — Telehealth: Payer: Self-pay

## 2024-03-11 DIAGNOSIS — E1151 Type 2 diabetes mellitus with diabetic peripheral angiopathy without gangrene: Secondary | ICD-10-CM

## 2024-03-11 DIAGNOSIS — L03116 Cellulitis of left lower limb: Secondary | ICD-10-CM

## 2024-03-11 LAB — OVA + PARASITE EXAM

## 2024-03-11 LAB — O&P RESULT

## 2024-03-11 NOTE — Progress Notes (Signed)
 Complex Care Management Note  Care Guide Note 03/11/2024 Name: [name] MRN: [id number] DOB: 1956-02-15  [name] is a 67 y.o. year old male who sees Norleen Lynwood LELON, MD for primary care. I reached out to [name] by phone today to offer complex care management services.  Mr. Weisensel was given information about Complex Care Management services today including:   The Complex Care Management services include support from the care team which includes your Nurse Care Manager, Clinical Social Worker, or Pharmacist.  The Complex Care Management team is here to help remove barriers to the health concerns and goals most important to you. Complex Care Management services are voluntary, and the patient may decline or stop services at any time by request to their care team member.   Complex Care Management Consent Status: Patient agreed to services and verbal consent obtained.   Follow up plan:  Telephone appointment with complex care management team member scheduled for:  04/01/2024  Encounter Outcome:  Patient Scheduled  [name], CMA [place]  [facility], [facility] Guide Direct Dial : [phone]  Fax: [phone] Website: [email or web address]

## 2024-03-11 NOTE — Patient Instructions (Signed)
 Visit Information  Thank you for taking time to visit with me today. Please don't hesitate to contact me if I can be of assistance to you before our next scheduled telephone appointment.  Our next appointment is by telephone on Friday 03/18/24 at 2:30 pm  Please call the care guide team at [phone] if you need to cancel or reschedule your appointment.   Following is a copy of your care plan:   Goals Addressed             This Visit's Progress    COMPLETED: [facility] Transitions of Care (TOC) Care Plan       Problems:  Recent Hospitalization for treatment of DMII Diet/Nutrition/Food Resources  Functional/Safety concern: no utilities x last several months: confirmed cardiology LCSW active/ established and working with DSS staff [facility] services barrier: unable to find notation in inpatient notes that this concern was addressed prior to discharge:  verified patient's insurance company has reached out to PCP on 03/07/24 to request home health services-- however- possibility limited options given no utilities in home: facilitated scheduling of PCP hospital follow up visit for 03/15/24: will message PCP accordingly (no response noted to insurance message on 03/07/24) Limited social support: lives alone; neighbors assist with needs on limited basis; widower Medication management barrier: currently keeping insulin  stored in portable cooler, on ice Multiple SDOH barriers at baseline x last several months: message sent to LCSW for proactive collaboration (2) recent surgical (L) toe amputations: in May 2025- (L) 5th toe; most recent hospitalization: (L) great toe amputation; wearing boot on (L) foot and using cane Most recent hospitalizations:   July 19-25, 2025 for 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: declined SNF placement for rehabilitation July 29-31, ongoing (L) foot pain/ parainfluenza viral infection: declined SNF placement for rehabilitation  Goal:  Over  the next 30 days, the patient will not experience hospital readmission  Interventions:  Transitions of Care: week # 1/ day # 1 Durable Medical Equipment (DME) needs assessed with patient/caregiver Doctor Visits  - discussed the importance of doctor visits Communication with PCP and established LCSW re: enrollment into [facility] 30-day program Post discharge activity limitations prescribed by provider reviewed Post-op wound/incision care reviewed with patient/caregiver Reviewed Signs and symptoms of infection Care Coordination outreach with established LCSW Chrystine Chasse/ cardiology: she updates that she has spoken with patient today, as well as established DSS coordinator, Hadassah; confirms they will remain involved/ - however limitations and barriers persist/ continue around patient's ongoing non-adherence, refusal of short-term placement in rehabilitation facility where he would have utilities, 24/7 care, regular access to medications/ food Provided reinforcement of previously provided education around significant hygiene concerns per patient ongoing report: currently using BSC and  urinal for all toileting needs: has to manually bring 5 gallon buckets of water into his commode to deploy manual flush when he empties his BSC: provided education around need to be as clean as possible when performing this task, as well as precautions around heaving lifting in setting of 2 recent (L) toe amputations/ fall risk  Provided reinforcement of previously provided education around need to keep incision clean and dry, along with  ongoing inquiry how patient will do this at home without running water: today, he verbalizes understanding that he should not use stored rainwater to bathe in, nor to cleanse amputation wounds; provided reinforcement of previously provided education around proper and basic wound care along with signs/ symptoms infection as well as concerns around infection control given his  report of his  living conditions today: he reports he is in the process of cleaning things up a little bit; and he confirms that he is not changing the (L) foot dressing at all; states they changed it in the hospital, and Dr. Malvin told me to not mess with the dressing at all until he sees it next week Had frank and pointed compassionate conversation with patient re: need for improved adherence/ engagement in his own care/ situation/ to take steps to make better decisions so that he can start to heal from his multiple recent surgeries and health challenges; he tells me he is planning to mow his grass this evening; encouraged him to NOT mow his grass until after he has scheduled upcoming provider office visits, and to slow down and please make safe decisions that support healing, as his established caregivers have all advised- he eventually acknowledges that he probably does need to make better decisions, and tells me he will not mow, at least until he gets clearance from his established care providers   Diabetes Interventions: Provided education to patient about basic DM disease process Discussed plans with patient for ongoing care management follow up and provided patient with direct contact information for care management team Reviewed scheduled/upcoming provider appointments including: endocrinology provider: 03/09/24: patient confirms plans to attend as scheduled Review of patient status, including review of consultants reports, relevant laboratory and other test results, and medications completed Assessed social determinant of health barriers Again reports taking long acting insulin  35 U QD as prescribed post- most recent hospital discharge; reports now keeping insulin  cold using neighbor's refrigerator: states he goes over every morning and gives myself the insulin   again reports he is not currently checking blood sugars at home: I don't want to, I have too much going on with all of the problems I  have; Encouraged him to re-schedule endocrinology provider appointment that was missed due to his most recent hospitalization, he states he will do as soon as I can Encouraged patient to maintain communication with established LCSW/ DSS- and to make all care providers aware of his ongoing needs/ questions/ challenges: he verbalizes understanding and agreement    Lab Results  Component Value Date   HGBA1C 11.2 (H) 02/22/2024   Plan:  Telephone follow up appointment with care management team member scheduled for:  Friday 03/18/24 at 2:30 pm       The patient verbalized understanding of instructions, educational materials, and care plan provided today and DECLINED offer to receive copy of patient instructions, educational materials, and care plan.   If you are experiencing a Mental Health or Behavioral Health Crisis or need someone to talk to, please  call the Suicide and Crisis Lifeline: 988 call the [place]  National Suicide Prevention Lifeline: 325-366-3791 or TTY: (805) 287-1267 TTY 901-478-0091) to talk to a trained counselor call 1-800-273-TALK (toll free, 24 hour hotline) go to [facility] [address] [place] [phone]) call the [facility] Crisis Line: (747)262-2667 call 911  Pls call/ message for questions,  Johnathyn Viscomi Mckinney Makaelah Cranfield, RN, BSN, CCRN Alumnus RN Care Manager  Transitions of Care  [facility] - [facility] [phone]: direct office

## 2024-03-11 NOTE — Transitions of Care (Post Inpatient/ED Visit) (Signed)
 03/11/2024  Name: [name] MRN: [id number] DOB: March 20, 1956  Today's TOC FU Call Status: Today's TOC FU Call Status:: Successful TOC FU Call Completed TOC FU Call Complete Date: 03/11/24 Patient's Name and Date of Birth confirmed.  Transition Care Management Follow-up Telephone Call Date of Discharge: 03/10/24 Discharge Facility: [facility]) Type of Discharge: Inpatient Admission Primary Inpatient Discharge Diagnosis:: Parainfluenza viral infection/ multiple recent hospitalizations for osteomyelitis with (2) recent surgical amputations of (L) 5th and great toe: again declined SNF placement How have you been since you were released from the hospital?: Better (I feel better but I can't afford all these medications, I didn't even go to the pharmacy to try and pick them up. I have too much going on to deal with all this medical stuff.  I will be going to my appointments next week. I talked to the Child psychotherapist) Any questions or concerns?: Yes Patient Questions/Concerns:: Ongoing and unchanged multiple SDOH needs as per baseline: confirmed currently established LCSW remains active: care coordination outreach with LCSW completed earlier today: LCSW confirms she continues working with DSS Services in attempt to mitigate multiple known SDOH needs, which have been present for many months Patient Questions/Concerns Addressed: Other: (Care Coordination outreach: established LCSW; encouraged patient's ongoing engagement with LCSW; had frank discussion with patient regarding need to begin taking personal responsibility to ensure his health needs are addressed: re- ongoing non-adherence)  Items Reviewed: Did you receive and understand the discharge instructions provided?: Yes (Patient declined detailed review of AVS: I am not near those papers right now) Medications obtained,verified, and reconciled?: Partial Review Completed (confirmed patient did NOT obtain/ is not taking newly Rx'd medications  as instructed; self-manages medications and continues to report ongoing issues/ non-adherence with medications/ medication management) Reason for Partial Mediation Review: Patient declined full review: confirmed he has established in-person visit with Pharmacy team at PCP office on 03/15/24- encouraged him strongly to attend this appointment as scheduled and encouraged him to physically take ALL of the medications he has in to the office visit with him; confirmed that he is currently storing his long-acting insulin  at his neighbor's home, in their refrigerator Any new allergies since your discharge?: No Dietary orders reviewed?: Yes Type of Diet Ordered:: I eat whatever I have here to eat; I usually go out every meal for my food, because I still don't have a refigerator and no power, no water, no lights Do you have support at home?: Yes People in Home [RPT]: alone Name of Support/Comfort Primary Source: Reports independent in self-care activities: ongoing non-adherence to hygiene and wound care needs due to multiple SDOH needs:  established LCSW has been active for several months and is continuing to work with DSS on patient's behalf; patient reports he has several real good neighbors who assist as able with his multiple needs  Medications Reviewed Today: Medications Reviewed Today     Reviewed by Makyla Bye M, RN (Registered Nurse) on 03/11/24 at 1620  Med List Status: <None>   Medication Order Taking? Sig Documenting Provider Last Dose Status Informant  albuterol  (VENTOLIN  HFA) 108 (90 Base) MCG/ACT inhaler [id number]  Inhale 2 puffs into the lungs every 6 (six) hours as needed for wheezing or shortness of breath.  Patient not taking: Reported on 03/11/2024   Rashid, Farhan, MD  Active   amoxicillin -clavulanate (AUGMENTIN ) 875-125 MG tablet [id number]  Take 1 tablet by mouth 2 (two) times daily. Standiford, Marsa FALCON, DPM  Active Self, Pharmacy Records  aspirin  EC 81  MG tablet [id number]  Take  1 tablet (81 mg total) by mouth daily. Swallow whole. Charlyne Reed, PA-C  Active Self, Pharmacy Records  atorvastatin  (LIPITOR ) 40 MG tablet [id number]  TAKE 1 TABLET BY MOUTH ONCE DAILY Norleen Lynwood ORN, MD  Active Self, Pharmacy Records  budesonide -formoterol  (SYMBICORT ) 80-4.5 MCG/ACT inhaler [id number]  Inhale 2 puffs into the lungs daily.  Patient not taking: Reported on 03/11/2024   Rashid, Farhan, MD  Active   citalopram  (CELEXA ) 20 MG tablet [id number]  Take 1 tablet (20 mg total) by mouth daily. Norleen Lynwood ORN, MD  Active Self, Pharmacy Records  clopidogrel  (PLAVIX ) 75 MG tablet [id number]  Take 1 tablet (75 mg total) by mouth daily. Norleen Lynwood ORN, MD  Active Self, Pharmacy Records  dextromethorphan -guaiFENesin  (MUCINEX  DM) 30-600 MG 12hr tablet [id number]  Take 1 tablet by mouth 2 (two) times daily.  Patient not taking: Reported on 03/11/2024   Rashid, Farhan, MD  Active   insulin  glargine, 2 Unit Dial , (TOUJEO  MAX SOLOSTAR) 300 UNIT/ML Solostar Pen [id number] Yes Inject 35 Units into the skin every morning. Vernon Ranks, MD  Active Self, Pharmacy Records  irbesartan  (AVAPRO ) 300 MG tablet [id number]  Take 1 tablet (300 mg total) by mouth daily. Norleen Lynwood ORN, MD  Active Self, Pharmacy Records  loratadine  (CLARITIN ) 10 MG tablet [id number]  Take 10 mg by mouth daily as needed. [provider]  Active Self, Pharmacy Records  pantoprazole  (PROTONIX ) 40 MG tablet [id number]  Take 1 tablet (40 mg total) by mouth daily. Norleen Lynwood ORN, MD  Active Self, Pharmacy Records           Home Care and Equipment/Supplies: Were Home Health Services Ordered?: No Any new equipment or medical supplies ordered?: No  Functional Questionnaire: Do you need assistance with bathing/showering or dressing?: No (Continues to report no running water at his home/ no utilities; reports the surgeon told me not to take a bath or a shower.  He told me to keep my (L) foot dry and not take the bandage off of it until I  go to see him; I am taking sink baths when I can) Do you need assistance with meal preparation?: No (Reports eats every meal out at fast food places) Do you need assistance with eating?: No Do you have difficulty maintaining continence: No (again reports uses BSC and urinal for all toileting: once filled, he brings water from neighbor or from stored rain barrel to manually flush his commode to dispose of elimated waste) Do you need assistance with getting out of bed/getting out of a chair/moving?: No Do you have difficulty managing or taking your medications?: Yes (Today, patient declines medication review and admits he is unable to afford all medications; states I have so much going on, I don't even know what I am supposed to be taking confirmed storing insulin  in neighbors refrigerator and taking as instructed)  Follow up appointments reviewed: PCP Follow-up appointment confirmed?: Yes Date of PCP follow-up appointment?: 03/15/24 Follow-up Provider: PCP- Dr. Norleen Specialist [facility] Follow-up appointment confirmed?: Yes Date of Specialist follow-up appointment?: 03/15/24 Follow-Up Specialty Provider:: Surgical podiatry provider Do you need transportation to your follow-up appointment?: No Do you understand care options if your condition(s) worsen?: Yes-patient verbalized understanding  SDOH Interventions Today    Flowsheet Row Most Recent Value  SDOH Interventions   Food Insecurity Interventions Other (Comment)  [ongoing food insecurity as per prior documentation from multiple disciplines,  confirmed LCSW/ DSS remains established and involved: LCSW outreach occurred  earlier today 03/11/24]  Housing Interventions Other (Comment)  [ongoing housing insecurity as per prior documentation from multiple disciplines,  confirmed LCSW/ DSS remains established and involved: LCSW outreach occurred earlier today 03/11/24]  Transportation Interventions Intervention Not Indicated  [Today, patient  again reports driving self,  adds he recently obtained his needed vehicle tags and liscence plates, so I am at least driving legal now]  Utilities Interventions Other (Comment)  [ongoing utility insecurity as per prior documentation from multiple disciplines,  confirmed LCSW/ DSS remains established and involved: LCSW outreach occurred earlier today 03/11/24]  Financial Strain Interventions Other (Comment)  [ongoing financial insecurity as per prior documentation from multiple disciplines,  confirmed LCSW/ DSS remains established and involved: LCSW outreach occurred earlier today 03/11/24]  Stress Interventions Other (Comment)  [ongoing stress related to multiple factors, as documented- as per prior documentation from multiple disciplines,  confirmed LCSW/ DSS remains established and involved: LCSW outreach occurred earlier today 03/11/24]   See TOC assessment tabs for additional assessment/ TOC intervention information  Successfully re-enrolled into 30-day TOC program  Pls call/ message for questions,  Tennile Styles Mckinney Lavarius Doughten, RN, BSN, Media planner  Transitions of Care  [facility]BCI - Population Health  [place] [phone]: direct office

## 2024-03-11 NOTE — Telephone Encounter (Addendum)
 H&V Care Navigation CSW Progress Note  Clinical Social Worker contacted patient by phone to f/u on call without a message left this morning. Pt reached at [phone]. He shares that he is trying to get in touch with his endocrinologist to get rescheduled. He shares that he is frustrated that all his doctors were not notified he was in the hospital and they didn't come see him there. I shared that usually outpatient providers unless active in his inpatient admission do not visit their patients in the hospital anymore. Pt discharged and his friend Silver was going to let him stay with him but he asked for Silver to drop him off at home instead, he didn't feel comfortable with medications and bathroom needs staying with a friend. Silver was more than happy for him to stay at his house but he made choice to return home. I shared that his decision to return to his home without utilities etc while these friends were offering for him to stay there makes us  concerned for him. We want him to have support and the ability to use facilities more easily. Pt states understanding but states he has too much to do at home, need to move trash so I can maybe put this home on the market and that he has goats and a cat and he'd rather be at home.   He is aware of timing for DSS to become his rep payee, he unfortunately owes lots of back payments for his mortgage and his utilities and is without a way to pay for these to get them restarted and are too significant for our patient care fund to cover/DSS is without funding to assist with this as well at this time. I have left a voicemail for Hadassah Melonie Raford [place] DSS [phone]) and Laine, RN with [facility]BCI team to discuss further pt discharge and community resources. Pt sent Dr. Kris number and encouraged to call for appt, if he does not by next week I will try and assist.   All of this was discussed with Laine, RN with VBCI team, remain available, pt aware of my  upcoming time off and that my colleague Jenna, can answer any additional questions if needed until I return.   Patient is participating in a Managed Medicaid Plan:  No, [facility] Medicare  SDOH Screenings   Food Insecurity: Food Insecurity Present (03/09/2024)  Housing: High Risk (03/09/2024)  Transportation Needs: Unmet Transportation Needs (03/09/2024)  Utilities: At Risk (03/09/2024)  Alcohol Screen: Low Risk  (06/10/2023)  Depression (PHQ2-9): Low Risk  (02/22/2024)  Financial Resource Strain: Medium Risk (02/17/2024)  Physical Activity: Inactive (06/10/2023)  Social Connections: Socially Isolated (03/09/2024)  Stress: Stress Concern Present (02/17/2024)  Tobacco Use: High Risk (02/28/2024)  Health Literacy: Inadequate Health Literacy (02/17/2024)    [name], MSW, LCSW Clinical Social Worker II [facility] Heart/Vascular Care Navigation  [phone]- work cell phone (preferred)

## 2024-03-15 ENCOUNTER — Ambulatory Visit: Admitting: Internal Medicine

## 2024-03-15 ENCOUNTER — Ambulatory Visit (INDEPENDENT_AMBULATORY_CARE_PROVIDER_SITE_OTHER)

## 2024-03-15 ENCOUNTER — Ambulatory Visit (INDEPENDENT_AMBULATORY_CARE_PROVIDER_SITE_OTHER): Admitting: Podiatry

## 2024-03-15 ENCOUNTER — Telehealth: Payer: Self-pay | Admitting: Licensed Clinical Social Worker

## 2024-03-15 ENCOUNTER — Encounter: Payer: Self-pay | Admitting: Internal Medicine

## 2024-03-15 VITALS — BP 144/72 | HR 46 | Ht 68.0 in | Wt 152.2 lb

## 2024-03-15 DIAGNOSIS — L03032 Cellulitis of left toe: Secondary | ICD-10-CM

## 2024-03-15 DIAGNOSIS — L02612 Cutaneous abscess of left foot: Secondary | ICD-10-CM | POA: Diagnosis not present

## 2024-03-15 DIAGNOSIS — I739 Peripheral vascular disease, unspecified: Secondary | ICD-10-CM

## 2024-03-15 DIAGNOSIS — F17200 Nicotine dependence, unspecified, uncomplicated: Secondary | ICD-10-CM

## 2024-03-15 DIAGNOSIS — Z794 Long term (current) use of insulin: Secondary | ICD-10-CM

## 2024-03-15 DIAGNOSIS — E1142 Type 2 diabetes mellitus with diabetic polyneuropathy: Secondary | ICD-10-CM

## 2024-03-15 DIAGNOSIS — Z8619 Personal history of other infectious and parasitic diseases: Secondary | ICD-10-CM | POA: Diagnosis not present

## 2024-03-15 DIAGNOSIS — L97522 Non-pressure chronic ulcer of other part of left foot with fat layer exposed: Secondary | ICD-10-CM | POA: Diagnosis not present

## 2024-03-15 DIAGNOSIS — E119 Type 2 diabetes mellitus without complications: Secondary | ICD-10-CM | POA: Diagnosis not present

## 2024-03-15 DIAGNOSIS — B348 Other viral infections of unspecified site: Secondary | ICD-10-CM

## 2024-03-15 DIAGNOSIS — E1151 Type 2 diabetes mellitus with diabetic peripheral angiopathy without gangrene: Secondary | ICD-10-CM

## 2024-03-15 MED ORDER — OXYCODONE-ACETAMINOPHEN 5-325 MG PO TABS: 1.0000 | ORAL_TABLET | ORAL | 0 refills | Status: AC | PRN

## 2024-03-15 NOTE — Assessment & Plan Note (Signed)
 Pt counsled to quit, pt not ready

## 2024-03-15 NOTE — Telephone Encounter (Signed)
 H&V Care Navigation CSW Progress Note  Clinical Social Worker reached out to [facility] team in [place] to f/u on referral for pt. Given recent hospitalizations and pt's challenge with managing care unclear if they were able to reach him. Sent secure f/u email.  Have not heard from DSS caseworker, will try and f/u again tomorrow.   Patient is participating in a Managed Medicaid Plan:  No, [facility] Medicare  SDOH Screenings   Food Insecurity: Food Insecurity Present (03/11/2024)  Housing: High Risk (03/11/2024)  Transportation Needs: Unmet Transportation Needs (03/11/2024)  Utilities: At Risk (03/11/2024)  Alcohol Screen: Low Risk  (06/10/2023)  Depression (PHQ2-9): Low Risk  (02/22/2024)  Financial Resource Strain: High Risk (03/11/2024)  Physical Activity: Inactive (06/10/2023)  Social Connections: Socially Isolated (03/09/2024)  Stress: Stress Concern Present (03/11/2024)  Tobacco Use: High Risk (03/15/2024)  Health Literacy: Inadequate Health Literacy (02/17/2024)    [name], MSW, LCSW Clinical Social Worker II [facility] Heart/Vascular Care Navigation  [phone]- work cell phone (preferred)

## 2024-03-15 NOTE — Progress Notes (Signed)
 Patient ID: [name], male   DOB: 09-11-1955, 68 y.o.   MRN: [id number]        Chief Complaint: follow up post hospn 7/29 - 7/31 with viral pneumonia with acute hypoxic resp failure       HPI:  [name] is a 68 y.o. male here with above, admits to not taking meds as prescribed due to social and financial stressors,  To see office pharmacist regarding recent inhaler expensive meds, did take the augmentin  and other oral meds  Has surgical f/u later today for recent left hallux amputation and heel wound.   Pt denies chest pain, increased sob or doe, wheezing, orthopnea, PND, [name], palpitations, [name], polyuria, or new focal neuro s/s.    Wt Readings from Last 3 Encounters:  03/15/24 152 lb 3.2 oz (69 kg)  03/10/24 159 lb 13.3 oz (72.5 kg)  02/29/24 159 lb 13.3 oz (72.5 kg)   BP Readings from Last 3 Encounters:  03/15/24 (!) 144/72  03/10/24 (!) 142/62  03/08/24 (!) 154/61         Past Medical History:  Diagnosis Date   Adenomatous colon polyp    Allergic rhinitis    At risk for sleep apnea    STOP-BANG= 5   SENT TO PCP 03-14-2014   Bladder cancer (HCC)    CAD (coronary artery disease)    a. 07/2014 low risk MV; b. 07/2019 Cor CTA (FFR): LM 25-49 (nl), LAD mild prox/mid plaque (nl), D1 25-49p(nl w/ abnl FFR of 0.73 in inf branch), LCX/OM1 mild prox/mid plaque (nl), RCA nondominant, minimal Ca2+ plaque (nl), RPDA (mildly abnl @ 0.79)-->Med Rx..   Cataract    surgically removed bilateral   Condyloma acuminatum of penis    Diabetic neuropathy (HCC)    Diastolic dysfunction    a. 05/2019 Echo: EF 60-65%, no rwam, mod LVH, impaired relaxation, nl RV size/fxn, trace MR, triv TR.   Diverticulosis    GERD (gastroesophageal reflux disease)    History of bladder cancer    s/p  turbt  2013/   transitional cell carcinoma--    History of condyloma acuminatum    PERINEAL AREA  W/ RECURRENCY   History of gout    Hyperlipidemia     Hypertension    Hypoxemia 03/09/2024   Lower urinary tract symptoms (LUTS)    PAF (paroxysmal atrial fibrillation) (HCC)    a. 06/2019 Event monitor: PAF <1% burden. Longest 3 mins 36 secs.   Productive cough    PSVT (paroxysmal supraventricular tachycardia) (HCC)    a. 06/2019 Event monitor: 112 episodes of SVT, longest 21 secs.   PVD (peripheral vascular disease) with claudication (HCC)    a. 03/2014 LE art duplex: long segment occlusion of mid to distal R SFA; b. 03/2020 ABI: nl left and mildly improved R ABI->med rx.   Renal artery stenosis (HCC)    Renal artery stenosis (HCC)    a. 12/2020 Renal art duplex: RRA 1-59%, LRA >60%.   Smokers' cough (HCC)    Type 2 diabetes mellitus with insulin  therapy [facility]) 1992   monitor by  dr ellsion   Wears dentures    upper   Past Surgical History:  Procedure Laterality Date   ABDOMINAL AORTOGRAM W/LOWER EXTREMITY N/A 02/15/2024   Procedure: ABDOMINAL AORTOGRAM W/LOWER EXTREMITY;  Surgeon: Pearline Norman RAMAN, MD;  Location: [facility] INVASIVE CV LAB;  Service: Cardiovascular;  Laterality: N/A;   AMPUTATION Left 01/02/2024  Procedure: AMPUTATION, FOOT, RAY;  Surgeon: Malvin Marsa FALCON, DPM;  Location: [facility] OR;  Service: Orthopedics/Podiatry;  Laterality: Left;  FIFTH TOE   AMPUTATION TOE Left 02/29/2024   Procedure: AMPUTATION, LEFT GREAT TOE;  Surgeon: Malvin Marsa FALCON, DPM;  Location: [facility] OR;  Service: Orthopedics/Podiatry;  Laterality: Left;  Left Great toe partial amputation,  Debridement Left Heel   AXILLARY HIDRADENITIS EXCISION  1997   CARDIOVASCULAR STRESS TEST  07-24-2014  dr darron grass   Low risk lexiscan  nuclear study with apical thinning and small inferolateral wall infarct at mid & basal level , no ischemia/  normal LVF and wall motion , ef 59%   CATARACT EXTRACTION Left    CATARACT EXTRACTION W/ INTRAOCULAR LENS IMPLANT Right    CO2 LASER APPLICATION N/A 03/20/2014   Procedure: CO2 LASER APPLICATION,PENIS, GROIN, ANUS;  Surgeon:  Elspeth KYM Schultze, MD;  Location: [facility];  Service: General;  Laterality: N/A;   CO2 LASER APPLICATION N/A 05/21/2015   Procedure: CO2 LASER APPLICATION;  Surgeon: Mark Ottelin, MD;  Location: [facility];  Service: Urology;  Laterality: N/A;   CONDYLOMA EXCISION/FULGURATION N/A 05/21/2015   Procedure: CONDYLOMA REMOVAL;  Surgeon: Mark Ottelin, MD;  Location: [facility];  Service: Urology;  Laterality: N/A;   HEMORRHOID SURGERY  10/24/2014   Procedure: HEMORRHOIDECTOMY;  Surgeon: Elspeth Schultze, MD;  Location: [facility];  Service: General;;   INCISION AND DRAINAGE ABSCESS Left 10/24/2014   Procedure: INCISION AND DRAINAGE ABSCESS;  Surgeon: Elspeth Schultze, MD;  Location: [facility];  Service: General;  Laterality: Left;   INGUINAL HIDRADENITIS EXCISION  1998, 1999   LASER ABLATION CONDOLAMATA N/A 03/20/2014   Procedure: EXAM UNDER ANESTHESIA, REMOVAL/ABLATION OF CONDYLOMATA PENIS,GROINS, ANUS, ANAL CANAL;  Surgeon: Elspeth KYM Schultze, MD;  Location: [facility];  Service: General;  Laterality: N/A;  groin and anus   LASER ABLATION CONDOLAMATA N/A 10/24/2014   Procedure: LASER ABLATION CONDOLAMATA;  Surgeon: Elspeth Schultze, MD;  Location: [facility];  Service: General;  Laterality: N/A;   LASER ABLATION OF PENILE AND PERIANAL WARTS  07-29-2007  Dr. Schultze   LEFT SHOULDER SURGERY  2003   LOWER EXTREMITY ANGIOGRAPHY N/A 01/01/2024   Procedure: Lower Extremity Angiography;  Surgeon: Sheree Penne Bruckner, MD;  Location: [facility] INVASIVE CV LAB;  Service: Vascular;  Laterality: N/A;   LOWER EXTREMITY ANGIOGRAPHY N/A 02/15/2024   Procedure: Lower Extremity Angiography;  Surgeon: Pearline Norman RAMAN, MD;  Location: [facility] INVASIVE CV LAB;  Service: Cardiovascular;  Laterality: N/A;   LOWER EXTREMITY INTERVENTION  01/01/2024   Procedure: LOWER EXTREMITY INTERVENTION;  Surgeon: Sheree Penne Bruckner, MD;  Location:  [facility] INVASIVE CV LAB;  Service: Vascular;;   LOWER EXTREMITY INTERVENTION N/A 02/15/2024   Procedure: LOWER EXTREMITY INTERVENTION;  Surgeon: Pearline Norman RAMAN, MD;  Location: [facility] INVASIVE CV LAB;  Service: Cardiovascular;  Laterality: N/A;   MASS EXCISION N/A 10/24/2014   Procedure: EXCISION OF PERINEAL MASS/SINUS;  Surgeon: Elspeth Schultze, MD;  Location: [facility];  Service: General;  Laterality: N/A;   MOHS SURGERY     back   MOHS SURGERY  2017   face   MULTIPLE TOOTH EXTRACTIONS     PERINEAL HIDRADENITIS EXCISION  1998, 1999   TRANSURETHRAL RESECTION OF BLADDER TUMOR  05/21/2012   Procedure: TRANSURETHRAL RESECTION OF BLADDER TUMOR (TURBT);  Surgeon: Mark C Ottelin, MD;  Location: [facility];  Service: Urology;  Laterality: N/A;  reports that he has been smoking cigars and cigarettes. He started smoking about 49 years ago. He has a 41 pack-year smoking history. He has never used smokeless tobacco. He reports that he does not drink alcohol and does not use drugs. family history includes Diabetes in his maternal aunt; Hypertension in his mother; Lung cancer in his father. Allergies  Allergen Reactions   Cefepime  Hives    Had allergic reaction to one of these 3 agents, unclear which one   Metoprolol  Hives    Had allergic reaction to one of these 3 agents, unclear which one  *Per RN, highly likely this is the cause of allergic rxn   Myrbetriq  [Mirabegron ] Hives    Had allergic reaction to one of these 3 agents, unclear which one   Current Outpatient Medications on File Prior to Visit  Medication Sig Dispense Refill   amoxicillin -clavulanate (AUGMENTIN ) 875-125 MG tablet Take 1 tablet by mouth 2 (two) times daily. 20 tablet 0   aspirin  EC 81 MG tablet Take 1 tablet (81 mg total) by mouth daily. Swallow whole. 30 tablet 12   atorvastatin  (LIPITOR ) 40 MG tablet TAKE 1 TABLET BY MOUTH ONCE DAILY 90 tablet 3   citalopram  (CELEXA ) 20 MG tablet Take 1 tablet (20  mg total) by mouth daily. 90 tablet 2   clopidogrel  (PLAVIX ) 75 MG tablet Take 1 tablet (75 mg total) by mouth daily. 30 tablet 11   insulin  glargine, 2 Unit Dial , (TOUJEO  MAX SOLOSTAR) 300 UNIT/ML Solostar Pen Inject 35 Units into the skin every morning. (Patient taking differently: Inject 32 Units into the skin every morning.) 15 mL 0   irbesartan  (AVAPRO ) 300 MG tablet Take 1 tablet (300 mg total) by mouth daily. 90 tablet 3   loratadine  (CLARITIN ) 10 MG tablet Take 10 mg by mouth daily as needed.     pantoprazole  (PROTONIX ) 40 MG tablet Take 1 tablet (40 mg total) by mouth daily. 100 tablet 2   No current facility-administered medications on file prior to visit.        ROS:  All others reviewed and negative.  Objective        PE:  BP (!) 144/72   Pulse (!) 46   Ht 5' 8 (1.727 m)   Wt 152 lb 3.2 oz (69 kg)   SpO2 98%   BMI 23.14 kg/m                 Constitutional: Pt appears in NAD               HENT: Head: NCAT.                Right Ear: External ear normal.                 Left Ear: External ear normal.                Eyes: . Pupils are equal, round, and reactive to light. Conjunctivae and EOM are normal               Nose: without d/c or deformity               Neck: Neck supple. Gross normal ROM               Cardiovascular: Normal rate and regular rhythm.                 Pulmonary/Chest: Effort normal and breath sounds without rales or wheezing.  Abd:  Soft, NT, ND, + BS, no organomegaly               Neurological: Pt is alert. At baseline orientation, motor grossly intact               Skin: Skin is warm. No rashes, no other new lesions, LE edema - none               Psychiatric: Pt behavior is normal without agitation   Micro: none  Cardiac tracings I have personally interpreted today:  none  Pertinent Radiological findings (summarize): none   Lab Results  Component Value Date   WBC 6.4 03/10/2024   HGB 11.6 (L) 03/10/2024   HCT 33.5 (L)  03/10/2024   PLT 233 03/10/2024   GLUCOSE 278 (H) 03/10/2024   CHOL 115 02/22/2024   TRIG 163.0 (H) 02/22/2024   HDL 40.40 02/22/2024   LDLDIRECT 54.0 05/01/2021   LDLCALC 42 02/22/2024   ALT 29 03/10/2024   AST 41 03/10/2024   NA 131 (L) 03/10/2024   K 3.4 (L) 03/10/2024   CL 97 (L) 03/10/2024   CREATININE 0.90 03/10/2024   BUN 12 03/10/2024   CO2 26 03/10/2024   TSH 1.260 03/09/2024   PSA 2.35 02/22/2024   INR 1.1 06/04/2019   HGBA1C 11.2 (H) 02/22/2024   MICROALBUR 3.6 (H) 02/22/2024   Assessment/Plan:  [name] is a 68 y.o. White or Caucasian [1] male with  has a past medical history of Adenomatous colon polyp, Allergic rhinitis, At risk for sleep apnea, Bladder cancer (HCC), CAD (coronary artery disease), Cataract, Condyloma acuminatum of penis, Diabetic neuropathy (HCC), Diastolic dysfunction, Diverticulosis, GERD (gastroesophageal reflux disease), History of bladder cancer, History of condyloma acuminatum, History of gout, Hyperlipidemia, Hypertension, Hypoxemia (03/09/2024), Lower urinary tract symptoms (LUTS), PAF (paroxysmal atrial fibrillation) (HCC), Productive cough, PSVT (paroxysmal supraventricular tachycardia) (HCC), PVD (peripheral vascular disease) with claudication (HCC), Renal artery stenosis (HCC), Renal artery stenosis (HCC), Smokers' cough (HCC), Type 2 diabetes mellitus with insulin  therapy (HCC) (1992), and Wears dentures.  Parainfluenza virus infection Clinically resolved, no further eval or tx needed  Insulin  dependent type 2 diabetes mellitus (HCC) Lab Results  Component Value Date   HGBA1C 11.2 (H) 02/22/2024   Recently uncontrolled, now restarted insulin , pt to continue current medical treatment toujeo  35 us  every day, for f/u lab   Tobacco abuse Pt counsled to quit, pt not ready  Followup: Return in about 6 months (around 09/15/2024).  Lynwood Rush, MD 03/15/2024 8:42 PM [facility] Internal  Medicine

## 2024-03-15 NOTE — Assessment & Plan Note (Signed)
Clinically resolved, no further eval or tx needed

## 2024-03-15 NOTE — Progress Notes (Unsigned)
 03/15/2024 Name: ART LEVAN MRN: [id number] DOB: 07-May-1956  Chief Complaint  Patient presents with   Diabetes   Medication Access    [name] is a 68 y.o. year old male who presented for an in person visit   They were referred to the pharmacist by their PCP for assistance in managing diabetes and medication access.   Subjective:  Care Team: Primary Care Provider: Norleen Lynwood LELON, MD ;   Medication Access/Adherence  Current Pharmacy:  [facility] [address] [place] - [address] [facility] [place] [address] Phone: [phone] Fax: [phone]  [facility] Delivery) - [place], [place] - [address] [facility] [address] [place] [address] [place] [address] Phone: [phone] Fax: [phone]  [facility] Delivery - [place], [place] - [address] [place] [address] Phone: [phone] Fax: [phone]  [facility] [address] [place] [address] Phone: [phone] Fax: [phone]  [facility] [address] [place] [address] Phone: [phone] Fax: [phone]   Patient reports affordability concerns with their medications: Yes  Patient reports access/transportation concerns to their pharmacy: No  Patient reports adherence concerns with their medications:  Yes     Diabetes:  Current medications:  Toujeo  32 units daily Medications tried in the past: Trulicity , Lantus , metformin , Novolin 70/30  Pt has been having significant financial difficulties and is unable to afford insulin  at this time  Previously received Toujeo  through PAP Has Endo appt scheduled for 8/26  Objective:  Lab Results  Component Value Date   HGBA1C 11.2 (H) 02/22/2024    Lab Results  Component Value Date   CREATININE 0.90 03/10/2024   BUN 12 03/10/2024   NA 131 (L) 03/10/2024   K 3.4  (L) 03/10/2024   CL 97 (L) 03/10/2024   CO2 26 03/10/2024    Lab Results  Component Value Date   CHOL 115 02/22/2024   HDL 40.40 02/22/2024   LDLCALC 42 02/22/2024   LDLDIRECT 54.0 05/01/2021   TRIG 163.0 (H) 02/22/2024   CHOLHDL 3 02/22/2024    Medications Reviewed Today     Reviewed by Merceda Lela SAUNDERS, RPH (Pharmacist) on 03/15/24 at 1616  Med List Status: <None>   Medication Order Taking? Sig Documenting Provider Last Dose Status Informant   Patient not taking:   Discontinued 03/15/24 1610   amoxicillin -clavulanate (AUGMENTIN ) 875-125 MG tablet [id number]  Take 1 tablet by mouth 2 (two) times daily. Standiford, Marsa FALCON, DPM  Active Self, Pharmacy Records  aspirin  EC 81 MG tablet [id number]  Take 1 tablet (81 mg total) by mouth daily. Swallow whole. Charlyne Reed, PA-C  Active Self, Pharmacy Records  atorvastatin  (LIPITOR ) 40 MG tablet [id number]  TAKE 1 TABLET BY MOUTH ONCE DAILY Norleen Lynwood LELON, MD  Active Self, Pharmacy Records   Patient not taking:   Discontinued 03/15/24 1610   citalopram  (CELEXA ) 20 MG tablet [id number]  Take 1 tablet (20 mg total) by mouth daily. Norleen Lynwood LELON, MD  Active Self, Pharmacy Records  clopidogrel  (PLAVIX ) 75 MG tablet [id number]  Take 1 tablet (75 mg total) by mouth daily. Norleen Lynwood LELON, MD  Active Self, Pharmacy Records   Patient not taking:   Discontinued 03/15/24 1610   insulin  glargine, 2 Unit Dial , (TOUJEO  MAX SOLOSTAR) 300 UNIT/ML Solostar Pen [id number] Yes Inject 35 Units into the skin  every morning.  Patient taking differently: Inject 32 Units into the skin every morning.   Vernon Ranks, MD  Active Self, Pharmacy Records  irbesartan  (AVAPRO ) 300 MG tablet [id number]  Take 1 tablet (300 mg total) by mouth daily. Norleen Lynwood ORN, MD  Active Self, Pharmacy Records  loratadine  (CLARITIN ) 10 MG tablet [id number]  Take 10 mg by mouth daily as needed. [provider]  Active Self, Pharmacy Records  pantoprazole  (PROTONIX ) 40 MG tablet  [id number]  Take 1 tablet (40 mg total) by mouth daily. Norleen Lynwood ORN, MD  Active Self, Pharmacy Records              Assessment/Plan:   Diabetes: - Currently uncontrolled, A1c goal <7% - Will have MAP tech check on [employer] PAP status - He qualifies for ExtraHelp which will lower medication costs. Has been referred to [facility] to assist with application process   Follow Up Plan: 1-2 weeks  Darrelyn Drum, PharmD, BCPS, CPP Clinical Pharmacist Practitioner  Primary Care at [facility] [phone]

## 2024-03-15 NOTE — Patient Instructions (Signed)
 Please continue all other medications as before,  Please have the pharmacy call with any other refills you may need.  Please continue your efforts at being more active, low cholesterol diet, and weight control.  Please keep your appointments with your specialists as you may have planned  We can hold on xray or labs today  The pharmacist in this office will d/w the medications  Please make an Appointment to return in 6 months, or sooner if needed

## 2024-03-15 NOTE — Progress Notes (Signed)
 Subjective:  Patient ID: [name], male    DOB: Oct 21, 1955,  MRN: [id number]  Chief Complaint  Patient presents with   Post-op Follow-up    Admitted to ed recently due to flu. Wound on left heel, wound near left 5th toe.     DOS: 02/29/2024 Procedure: 1. Amputation of left great toe through proximal phalanx head, left foot 2. Irrigation and excisional debridement of ulceration 1x1 cm to subcutaneous fat tissue level, left foot     68 y.o. male seen for post op check.  Patient seen 2 week s/p above procedures.  He was admitted due to respiratory viral infection last week.  Discharged on 7/31.  Dressing last changed that day.  Walking in postop shoe.  Still having runny travel.  Review of Systems: Negative except as noted in the HPI. Denies N/V/F/Ch.   Objective:   Constitutional Well developed. Well nourished.  Vascular Foot warm and well perfused. No bleeding noted upon debridement of any wound No calf pain with palpation  Neurologic Normal speech. Oriented to person, place, and time. Epicritic sensation diminished to left foot  Dermatologic Some necrotic changes well coapted and dry at the hallux amputation site on the left foot.  There is fibrotic and necrotic tissues present in the plantar ulceration overlying the first metatarsal phalangeal joint  Ulceration of the plantar heel appears to be healthy with relatively healthy granular tissue with some fibrotic tissue overlying.  Tender to palpation at the area  Left fifth toe amputation site does have ulceration that probes to subcutaneous fat tissue.  All these areas have minimal bleeding with debridement    Orthopedic: Status post left hallux amputation partial at the proximal phalanx head level and heel debridement   Radiographs: Postsurgical changes of the great toe amputation at the mid proximal phalanx.  Pathology: A. LEFT GREAT TOE, AMPUTATION:  Gangrenous and coagulative necrosis with marked acute cellulitis   extending to bone showing acute osteomyelitis  Proximal bone with articular cartilage negative for acute inflammation  and osteomyelitis   Micro: FEW ENTEROCOCCUS FAECALIS  RARE VAGOCOCCUS FLUVIALIS  Standardized susceptibility testing for this organism is not available.   Assessment:   1. Cellulitis and abscess of toe of left foot   2. Ulcer of left foot with fat layer exposed (HCC)   3. Type 2 diabetes mellitus with diabetic polyneuropathy, unspecified whether long term insulin  use (HCC)   4. 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  2 week s/p left hallux partial amputation through proximal phalanx head and left heel ulceration debridement to subcutaneous fat tissue - Overall slightly improved since I last saw him with decreased maceration in the first webspace.  There is still concern for nonhealing wound at the fifth toe amputation site as well as necrotic ulceration underlying the first metatarsal head.  There is minimal bleeding of these areas upon debridement.  I am concerned about vascular supply to the foot especially the forefoot limiting his ability to be heel -Will refer him back to vascular for further evaluation to see if anything can be done to improve the arterial flow to the left lower extremity -Debrided the ulceration plantar heel and plantar first met head with tissue nipper.  Minimal bleeding seen.  Also debrided the fifth toe amputation site ulceration/dehiscence area. -XR: Expected postoperative changes -WB  Status: Weightbearing as tolerated in postop shoe -Sutures: Remain intact until next appointment -Medications/ABX: Patient has completed secondary course of Augmentin  for a week, monitor off antibiotics for now -Dressing: Applied new silver alginate contact layer and 4 x 4  gauze Kerlix Ace wrap dressing.  Leave this intact until next appointment - F/u Plan: Patient to follow-up in 1 week        Marolyn JULIANNA Honour, DPM [facility]

## 2024-03-15 NOTE — Assessment & Plan Note (Signed)
 Lab Results  Component Value Date   HGBA1C 11.2 (H) 02/22/2024   Recently uncontrolled, now restarted insulin , pt to continue current medical treatment toujeo  35 us  every day, for f/u lab

## 2024-03-16 ENCOUNTER — Telehealth: Payer: Self-pay | Admitting: Licensed Clinical Social Worker

## 2024-03-16 MED ORDER — TOUJEO MAX SOLOSTAR 300 UNIT/ML ~~LOC~~ SOPN: 35.0000 [IU] | PEN_INJECTOR | Freq: Every morning | SUBCUTANEOUS | 0 refills | Status: DC

## 2024-03-16 NOTE — Telephone Encounter (Signed)
 H&V Care Navigation CSW Progress Note  Clinical Social Worker contacted patient by phone to f/u on how he's doing. Pt answered at [phone], he was approved for Extra Help which should help lower his costs for medications. He states he has $40 left for the month b/c he has been going to so many appointments. Pt inquires about home meals, shared that his [employer] may offer Moms Meals but he would need microwave for that, he declines to ask his neighbors for microwave assistance.  He has a list of local food resources but declines to use gas to go get those items. I shared with him unfortunately there are limited/no home delivery options I know for fresh food in [facility]. Encouraged him to ask if his church has any volunteers that may be able to assist him with prepared items. I also sent him contact for [employer] social work team via text that may have more local connections for fresh meals.   I also have called Hadassah Fila with DSS regarding this concern and to see if any updates on representative payee, had to leave voicemail for her at [phone].   Patient is participating in a Managed Medicaid Plan:  No, [facility] Medicare  SDOH Screenings   Food Insecurity: Food Insecurity Present (03/11/2024)  Housing: High Risk (03/11/2024)  Transportation Needs: Unmet Transportation Needs (03/11/2024)  Utilities: At Risk (03/11/2024)  Alcohol Screen: Low Risk  (06/10/2023)  Depression (PHQ2-9): Low Risk  (02/22/2024)  Financial Resource Strain: High Risk (03/11/2024)  Physical Activity: Inactive (06/10/2023)  Social Connections: Socially Isolated (03/09/2024)  Stress: Stress Concern Present (03/11/2024)  Tobacco Use: High Risk (03/15/2024)  Health Literacy: Inadequate Health Literacy (02/17/2024)     [name], MSW, LCSW Clinical Social Worker II [facility] Heart/Vascular Care Navigation  [phone]- work cell phone (preferred)

## 2024-03-16 NOTE — Patient Instructions (Signed)
 It was a pleasure speaking with you today!  I have sent your insulin  to [facility]. You have been approved for Extra Help so the copay should be $12.15.  Feel free to call with any questions or concerns!  Darrelyn Drum, PharmD, BCPS, CPP Clinical Pharmacist Practitioner [facility] at [facility] [phone]

## 2024-03-17 ENCOUNTER — Telehealth: Admitting: *Deleted

## 2024-03-18 ENCOUNTER — Other Ambulatory Visit: Payer: Self-pay | Admitting: *Deleted

## 2024-03-18 NOTE — Patient Instructions (Signed)
 Visit Information  Thank you for taking time to visit with me today. Please don't hesitate to contact me if I can be of assistance to you before our next scheduled telephone appointment.  Our next appointment is by telephone on Wednesday 03/23/24 at 3:15 pm  Please call the care guide team at [phone] if you need to cancel or reschedule your appointment.   Following are the goals we discussed today:  Patient Self Care Activities:  Attend all scheduled provider appointments Call pharmacy for medication refills 3-7 days in advance of running out of medications Call provider office for new concerns or questions  Participate in Transition of Care Program Take medications as prescribed   Work with the social worker to address care coordination needs and will continue to work with the clinical team to address health care and disease management related needs Continue pacing activity as your recuperation from recent surgery continues Use assistive devices as needed to prevent falls- your cane if needed and please keep wearing your boot If you believe your condition is getting worse- contact your care providers (doctors) promptly- reaching out to your doctor early when you have concerns can prevent you from having to go to the hospital Please take every effort and precaution to keep your left foot clean and dry and follow the instructions for wound care that your surgeon (Dr. Malvin) provided  If you are experiencing a Mental Health or Behavioral Health Crisis or need someone to talk to, please  call the Suicide and Crisis Lifeline: 988 call the [place]  National Suicide Prevention Lifeline: 640 447 5102 or TTY: 204-700-5451 TTY 847-872-0083) to talk to a trained counselor call 1-800-273-TALK (toll free, 24 hour hotline) go to [facility] [address] [place] [phone]) call the [place]: [phone] call 911   The patient  verbalized understanding of instructions, educational materials, and care plan provided today and DECLINED offer to receive copy of patient instructions, educational materials, and care plan.   Pls call/ message for questions,  Damarian Priola Mckinney Charnice Zwilling, RN, BSN, CCRN Alumnus RN Care Manager  Transitions of Care  VBCI - [facility] [phone]: direct office

## 2024-03-18 NOTE — Transitions of Care (Post Inpatient/ED Visit) (Signed)
 Transition of Care week 2/ day # 7  Visit Note  03/18/2024  Name: [name] MRN: [id number]          DOB: Aug 27, 1955  Situation: Patient enrolled in [facility] 30-day program. Visit completed with patient by telephone.   HIPAA identifiers x 2 verified  Background:  Functional/Safety concern: no utilities x last several months: confirmed cardiology LCSW active/ established and working with DSS staff [employer] social support: lives alone; neighbors assist with needs on limited basis; widower Multiple SDOH barriers at baseline x last several months: confirmed LCSW currently active (2) recent surgical (L) toe amputations: in May 2025- (L) 5th toe; most recent hospitalization: (L) great toe amputation; wearing boot on (L) foot and using cane prn Most recent hospitalization:  July 29- 31, 2025: parainfluenza virus with hypoxia July 19-25, 2025:  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 May 20-24, 2025:  osteomyelitis: amputation of 5th (L) toe (3) unplanned hospitalizations x last (12) and (6) months: since May 2025  Initial Transition Care Management Follow-up Telephone Call    Past Medical History:  Diagnosis Date   Adenomatous colon polyp    Allergic rhinitis    At risk for sleep apnea    STOP-BANG= 5   SENT TO PCP 03-14-2014   Bladder cancer (HCC)    CAD (coronary artery disease)    a. 07/2014 low risk MV; b. 07/2019 Cor CTA (FFR): LM 25-49 (nl), LAD mild prox/mid plaque (nl), D1 25-49p(nl w/ abnl FFR of 0.73 in inf branch), LCX/OM1 mild prox/mid plaque (nl), RCA nondominant, minimal Ca2+ plaque (nl), RPDA (mildly abnl @ 0.79)-->Med Rx..   Cataract    surgically removed bilateral   Condyloma acuminatum of penis    Diabetic neuropathy (HCC)    Diastolic dysfunction    a. 05/2019 Echo: EF 60-65%, no rwam, mod LVH, impaired relaxation, nl RV size/fxn, trace MR, triv TR.   Diverticulosis    GERD (gastroesophageal reflux disease)    History of bladder cancer     s/p  turbt  2013/   transitional cell carcinoma--    History of condyloma acuminatum    PERINEAL AREA  W/ RECURRENCY   History of gout    Hyperlipidemia    Hypertension    Hypoxemia 03/09/2024   Lower urinary tract symptoms (LUTS)    PAF (paroxysmal atrial fibrillation) (HCC)    a. 06/2019 Event monitor: PAF <1% burden. Longest 3 mins 36 secs.   Productive cough    PSVT (paroxysmal supraventricular tachycardia) (HCC)    a. 06/2019 Event monitor: 112 episodes of SVT, longest 21 secs.   PVD (peripheral vascular disease) with claudication (HCC)    a. 03/2014 LE art duplex: long segment occlusion of mid to distal R SFA; b. 03/2020 ABI: nl left and mildly improved R ABI->med rx.   Renal artery stenosis (HCC)    Renal artery stenosis (HCC)    a. 12/2020 Renal art duplex: RRA 1-59%, LRA >60%.   Smokers' cough (HCC)    Type 2 diabetes mellitus with insulin  therapy (HCC) 1992   monitor by  dr ellsion   Wears dentures    upper   Assessment:  I guess I am doing okay, I feel fine.  I went to the doctors appointments this week and I am keeping the bandage dry like Dr. Malvin told me to; I just take sponge baths now and I am not using the rainwater anymore to take baths- I buy the water in plastic gallon  jugs from the store.  I am using some of the food pantries that [facility] gave me.  She is still trying to get DSS to call me, it sure is taking a long time.  I saw the pharmacist at Dr. Nicola office.  I think everything is okay with my medicine, and I am taking the insulin  like they told me to 35 units once a day- storing it now in a cooler on ice, and keeping the ice clean and fresh.  I am not checking my blood sugars.  I have too much going on for that   Denies clinical concerns and sounds to be in no distress throughout TOC 30-day program outreach call today   Patient Reported Symptoms: Cognitive Cognitive Status: Alert and oriented to person, place, and time, Normal speech and language  skills, Insightful and able to interpret abstract concepts, Difficulties with attention and concentration, Poor judgment in daily scenarios Cognitive/Intellectual Conditions Management [RPT]: None reported or documented in medical history or problem list      Neurological Neurological Review of Symptoms: No symptoms reported    HEENT HEENT Symptoms Reported: No symptoms reported      Cardiovascular Cardiovascular Symptoms Reported: No symptoms reported Does patient have uncontrolled Hypertension?: No Cardiovascular Management Strategies: Routine screening, Medication therapy  Respiratory Respiratory Symptoms Reported: No symptoms reported Other Respiratory Symptoms: Denies shortness of breath; sounds to be in no respiratory distress throughout [facility] call Respiratory Management Strategies: Coping strategies, Medication therapy, Routine screening  Endocrine Endocrine Symptoms Reported: No symptoms reported Other symptoms related to hypoglycemia or hyperglycemia: Continues to report not monitoring blood sugars at home: reviewed previously provided education around signs/ symptoms hypo- hyper glycemia along with corresponding action plan: confirmed he is taking long acting insulin  at 35 U QD: currently reports storing insulin  in cooler with fresh clean ice; reinforced need for ice to be clean and for to be kept fresh- her verbalizes understanding of same; confirmed plans to attend upcoming endocrinology provider appointment 04/05/24 as scheduled Is patient diabetic?: Yes Is patient checking blood sugars at home?: No    Gastrointestinal Gastrointestinal Symptoms Reported: No symptoms reported Additional Gastrointestinal Details: Reports continues working with established LCSW for multiple commuity resources, including food: confirmed spoke with LCSW yesterday; patient also reports he is utilizing resources for food that have been provided to him by established LCSW Gastrointestinal Management  Strategies: Coping strategies    Genitourinary Genitourinary Symptoms Reported: No symptoms reported    Integumentary Integumentary Symptoms Reported: Incision, Wound Other Integumentary Symptoms: Reviewed recent podiatry office visit 03/15/24 with patient- confirmed patient endorses following instructions to leave exisiting bandage intact and dry without re-placing in between podiatry office visits; confirmed per his report that bandage is currently clean/ dry/ intact and that he is wearing surgical boot and elevating extremity as instructed by surgeon Skin Management Strategies: Dressing changes, Routine screening, Medication therapy, Coping strategies, Medical device  Musculoskeletal Musculoskelatal Symptoms Reviewed: No symptoms reported Other Musculoskeletal Symptoms: Today, patient denies use of assistive devices: states as long as I am in the boot, I can walk without any help; endorses wearing surgical boot as instructed Additional Musculoskeletal Details: (@) recent (L) foot toe amputations: (L) 5th toe and (L) great toe Musculoskeletal Management Strategies: Routine screening, Coping strategies, Medical device      Psychosocial Psychosocial Symptoms Reported: Difficulty concentrating, Hyperviligence Behavioral Management Strategies: Community resources, Coping strategies, Support system (reports neighbors and friends are his support system: denies having any local family; states I don't know any  of the family that I have left and none of them are local) Major Change/Loss/Stressor/Fears (CP): Medical condition, self, Legal concerns Techniques to Cope with Loss/Stress/Change: Diversional activities Quality of Family Relationships: supportive, helpful (neighbors)   There were no vitals filed for this visit.  Medications Reviewed Today     Reviewed by Melbert Botelho M, RN (Registered Nurse) on 03/18/24 at 1432  Med List Status: <None>   Medication Order Taking? Sig Documenting Provider  Last Dose Status Informant  amoxicillin -clavulanate (AUGMENTIN ) 875-125 MG tablet [id number]  Take 1 tablet by mouth 2 (two) times daily. Standiford, Marsa FALCON, DPM  Active Self, Pharmacy Records  aspirin  EC 81 MG tablet [id number]  Take 1 tablet (81 mg total) by mouth daily. Swallow whole. Charlyne Reed, PA-C  Active Self, Pharmacy Records  atorvastatin  (LIPITOR ) 40 MG tablet [id number]  TAKE 1 TABLET BY MOUTH ONCE DAILY Norleen Lynwood ORN, MD  Active Self, Pharmacy Records  citalopram  (CELEXA ) 20 MG tablet [id number]  Take 1 tablet (20 mg total) by mouth daily. Norleen Lynwood ORN, MD  Active Self, Pharmacy Records  clopidogrel  (PLAVIX ) 75 MG tablet [id number]  Take 1 tablet (75 mg total) by mouth daily. Norleen Lynwood ORN, MD  Active Self, Pharmacy Records  insulin  glargine, 2 Unit Dial , (TOUJEO  MAX SOLOSTAR) 300 UNIT/ML Solostar Pen [id number]  Inject 35 Units into the skin every morning. Norleen Lynwood ORN, MD  Active   irbesartan  (AVAPRO ) 300 MG tablet [id number]  Take 1 tablet (300 mg total) by mouth daily. Norleen Lynwood ORN, MD  Active Self, Pharmacy Records  loratadine  (CLARITIN ) 10 MG tablet [id number]  Take 10 mg by mouth daily as needed. [provider]  Active Self, Pharmacy Records  oxyCODONE -acetaminophen  (PERCOCET/ROXICET) 5-325 MG tablet [id number]  Take 1 tablet by mouth every 4 (four) hours as needed for up to 5 days for severe pain (pain score 7-10). Standiford, Marsa FALCON, DPM  Active   pantoprazole  (PROTONIX ) 40 MG tablet [id number]  Take 1 tablet (40 mg total) by mouth daily. Norleen Lynwood ORN, MD  Active Self, Pharmacy Records           Recommendation:   Specialty provider follow-up podiatry surgical provider 03/22/24 and endocrinology provider 04/05/24- as scheduled Continue Current Plan of Care  Follow Up Plan:   Telephone follow-up in 1 week- as scheduled 03/23/24  Pls call/ message for questions,  Pranshu Lyster Mckinney Antaniya Venuti, RN, BSN, CCRN Alumnus RN Care Manager  Transitions of Care  VBCI -  [facility] [phone]: direct office

## 2024-03-22 ENCOUNTER — Ambulatory Visit (INDEPENDENT_AMBULATORY_CARE_PROVIDER_SITE_OTHER): Admitting: Podiatry

## 2024-03-22 DIAGNOSIS — L97522 Non-pressure chronic ulcer of other part of left foot with fat layer exposed: Secondary | ICD-10-CM | POA: Diagnosis not present

## 2024-03-22 DIAGNOSIS — E1142 Type 2 diabetes mellitus with diabetic polyneuropathy: Secondary | ICD-10-CM

## 2024-03-22 DIAGNOSIS — I739 Peripheral vascular disease, unspecified: Secondary | ICD-10-CM

## 2024-03-22 NOTE — Progress Notes (Signed)
 Subjective:  Patient ID: BRODIE CORRELL, male    DOB: 1956/06/15,  MRN: [id number]  Chief Complaint  Patient presents with   Post-op Follow-up    Left foot. Sutures in place on the 1st toe. Patient has new area on the bottom of his foot. He is still struggling with food/electricity insecurities.     DOS: 02/29/2024 Procedure: 1. Amputation of left great toe through proximal phalanx head, left foot 2. Irrigation and excisional debridement of ulceration 1x1 cm to subcutaneous fat tissue level, left foot     68 y.o. male seen for post op check.  Patient seen 3 week s/p above procedures.  He reports he has been doing dressing changes.  He is still struggling with fluid electricity insecurities.  He has not seen the vascular team yet.  Having difficulty getting to appointments due to not being able to afford gas.  Review of Systems: Negative except as noted in the HPI. Denies N/V/F/Ch.   Objective:   Constitutional Well developed. Well nourished.  Vascular Foot warm and well perfused. No bleeding noted upon debridement of any wound No calf pain with palpation  Neurologic Normal speech. Oriented to person, place, and time. Epicritic sensation diminished to left foot  Dermatologic Some necrotic changes well coapted and dry at the hallux amputation site on the left foot.  There is fibrotic and necrotic tissues present in the plantar ulceration overlying the first metatarsal phalangeal joint which is worsened from prior with fatty necrosis seen in the wound bed.  This is ischemic with no bleeding on debridement.  Is getting slightly deeper with depth of necrosis.    Ulceration of the plantar heel appears to be healthy with healing granular tissue and nearly fully healed improved from prior  Left fifth toe amputation site does have ulceration that probes to subcutaneous fat tissue.  Slightly improved from prior  All these areas have minimal bleeding with debridement    Orthopedic: Status  post left hallux amputation partial at the proximal phalanx head level and heel debridement   Radiographs: Postsurgical changes of the great toe amputation at the mid proximal phalanx.  Pathology: A. LEFT GREAT TOE, AMPUTATION:  Gangrenous and coagulative necrosis with marked acute cellulitis  extending to bone showing acute osteomyelitis  Proximal bone with articular cartilage negative for acute inflammation  and osteomyelitis   Micro: FEW ENTEROCOCCUS FAECALIS  RARE VAGOCOCCUS FLUVIALIS  Standardized susceptibility testing for this organism is not available.   Assessment:   1. 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 PVD (peripheral vascular disease) (HCC)   3. 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  3 week s/p left hallux partial amputation through proximal phalanx head and left heel ulceration debridement to subcutaneous fat tissue - Overall slightly improved since I last saw him with healing of the right hallux amputation site as well as the fifth toe amputation site.  However there is no improvement in the plantar first MPJ ulceration. -Debrided the ulceration plantar heel and plantar first met head with tissue nipper.  Minimal bleeding seen.  Also debrided the fifth toe amputation site ulceration/dehiscence area. -XR: Expected postoperative changes -WB Status: Weightbearing as tolerated  in postop shoe -Sutures: Remain intact until next appointment -Medications/ABX: Patient has completed secondary course of Augmentin  for a week, monitor off antibiotics for now -Dressing: Applied new silver alginate contact layer and 4 x 4 gauze  Kerlix Ace wrap dressing.  Continue dressing changes at least 3 times weekly. - F/u Plan: Patient to follow-up in 2 week        Marolyn JULIANNA Honour, DPM [facility] / [facility]

## 2024-03-23 ENCOUNTER — Other Ambulatory Visit: Payer: Self-pay | Admitting: *Deleted

## 2024-03-23 NOTE — Patient Instructions (Signed)
 Visit Information  Thank you for taking time to visit with me today. Please don't hesitate to contact me if I can be of assistance to you before our next scheduled telephone appointment.  Our next appointment is by telephone on Thursday 03/31/24 at 1:00 pm  Please call the care guide team at [phone] if you need to cancel or reschedule your appointment.   Following are the goals we discussed today:  Patient Self Care Activities:  Attend all scheduled provider appointments Call pharmacy for medication refills 3-7 days in advance of running out of medications Call provider office for new concerns or questions  Participate in Transition of Care Program Take medications as prescribed   Work with the social worker to address care coordination needs and will continue to work with the clinical team to address health care and disease management related needs Continue pacing activity as your recuperation from recent surgery continues Use assistive devices as needed to prevent falls- your cane if needed and please keep wearing your boot If you believe your condition is getting worse- contact your care providers (doctors) promptly- reaching out to your doctor early when you have concerns can prevent you from having to go to the hospital Please take every effort and precaution to keep your left foot clean and dry and follow the instructions for wound care that your surgeon (Dr. Malvin) provided  If you are experiencing a Mental Health or Behavioral Health Crisis or need someone to talk to, please  call the Suicide and Crisis Lifeline: 988 call the [place]  National Suicide Prevention Lifeline: 440-294-5226 or TTY: 6460711410 TTY 725 726 7050) to talk to a trained counselor call 1-800-273-TALK (toll free, 24 hour hotline) go to [facility] [address] [place] [phone]) call the [facility]: [phone] call 911   The patient  verbalized understanding of instructions, educational materials, and care plan provided today and DECLINED offer to receive copy of patient instructions, educational materials, and care plan.   Lux Skilton Mckinney Annalise Mcdiarmid, RN, BSN, Media planner  Transitions of Care  VBCI - [facility] [phone]: direct office

## 2024-03-23 NOTE — Transitions of Care (Post Inpatient/ED Visit) (Signed)
 Transition of Care week 3/ day # 12  Visit Note  03/23/2024  Name: [name] MRN: [id number]          DOB: 05/23/1956  Situation: Patient enrolled in [facility] 30-day program. Visit completed with patient by telephone.   HIPAA identifiers x 2 verified  Background:  Functional/Safety concern: no utilities x last several months: confirmed cardiology LCSW active/ established and working with DSS staff [employer] social support: lives alone; neighbors assist with needs on limited basis; widower Multiple SDOH barriers at baseline x last several months: confirmed LCSW currently active (2) recent surgical (L) toe amputations: in May 2025- (L) 5th toe; most recent hospitalization: (L) great toe amputation; wearing boot on (L) foot and using cane prn Most recent hospitalization:  July 29- 31, 2025: parainfluenza virus with hypoxia  o July 19-25, 2025:  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  o May 20-24, 2025:  osteomyelitis: amputation of 5th (L) toe (3) unplanned hospitalizations x last (12) and (6) months: since May 2025  Initial Transition Care Management Follow-up Telephone Call    Past Medical History:  Diagnosis Date   Adenomatous colon polyp    Allergic rhinitis    At risk for sleep apnea    STOP-BANG= 5   SENT TO PCP 03-14-2014   Bladder cancer (HCC)    CAD (coronary artery disease)    a. 07/2014 low risk MV; b. 07/2019 Cor CTA (FFR): LM 25-49 (nl), LAD mild prox/mid plaque (nl), D1 25-49p(nl w/ abnl FFR of 0.73 in inf branch), LCX/OM1 mild prox/mid plaque (nl), RCA nondominant, minimal Ca2+ plaque (nl), RPDA (mildly abnl @ 0.79)-->Med Rx..   Cataract    surgically removed bilateral   Condyloma acuminatum of penis    Diabetic neuropathy (HCC)    Diastolic dysfunction    a. 05/2019 Echo: EF 60-65%, no rwam, mod LVH, impaired relaxation, nl RV size/fxn, trace MR, triv TR.   Diverticulosis    GERD (gastroesophageal reflux disease)    History of  bladder cancer    s/p  turbt  2013/   transitional cell carcinoma--    History of condyloma acuminatum    PERINEAL AREA  W/ RECURRENCY   History of gout    Hyperlipidemia    Hypertension    Hypoxemia 03/09/2024   Lower urinary tract symptoms (LUTS)    PAF (paroxysmal atrial fibrillation) (HCC)    a. 06/2019 Event monitor: PAF <1% burden. Longest 3 mins 36 secs.   Productive cough    PSVT (paroxysmal supraventricular tachycardia) (HCC)    a. 06/2019 Event monitor: 112 episodes of SVT, longest 21 secs.   PVD (peripheral vascular disease) with claudication (HCC)    a. 03/2014 LE art duplex: long segment occlusion of mid to distal R SFA; b. 03/2020 ABI: nl left and mildly improved R ABI->med rx.   Renal artery stenosis (HCC)    Renal artery stenosis (HCC)    a. 12/2020 Renal art duplex: RRA 1-59%, LRA >60%.   Smokers' cough (HCC)    Type 2 diabetes mellitus with insulin  therapy (HCC) 1992   monitor by  dr ellsion   Wears dentures    upper   Assessment:  I can't talk to you, we just talked the other day.  I am at the social security office going in now, trying to get some things straightened out.  I went to the doctors appointment with Dr. Malvin yesterday- he told me what to do, I am washing off the  wounds with soap and clean water.  None of this is my fault.  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  I'm doing good just to get to the doctor's appointments, and I don't know how long I can keep that up.  Just call me later    Denies clinical concerns and sounds to be in no distress throughout [facility] 30-day program outreach call today, however- patient remains very difficult to re-focus and assess- he tells me he needs to go into the social security office, but when I try to hang up/ reschedule our call- he starts up yelling again and remains confrontational throughout call; eventually was able to schedule next week TOC call with patient  Patient  Reported Symptoms: Cognitive Cognitive Status: Alert and oriented to person, place, and time, Normal speech and language skills, Poor judgment in daily scenarios, Requires Assistance Decision Making, Insightful and able to interpret abstract concepts Cognitive/Intellectual Conditions Management [RPT]: None reported or documented in medical history or problem list      Neurological Neurological Review of Symptoms: No symptoms reported    HEENT HEENT Symptoms Reported: No symptoms reported      Cardiovascular Cardiovascular Symptoms Reported: No symptoms reported Does patient have uncontrolled Hypertension?: No Cardiovascular Management Strategies: Routine screening, Medication therapy  Respiratory Respiratory Symptoms Reported: No symptoms reported Other Respiratory Symptoms: Denies shortness of breath and sounds to be in no respiratory distress throughout TOC call- he is yelling/ talking very loudly throughout [facility] call Respiratory Management Strategies: Routine screening, Coping strategies, Medication therapy  Endocrine Endocrine Symptoms Reported: No symptoms reported Other symptoms related to hypoglycemia or hyperglycemia: Confirmed continues not monitoring blood sugars at home; declines discussion around medications today; unable to re-focus patient to determine use of insulin  Is patient diabetic?: Yes Is patient checking blood sugars at home?: No    Gastrointestinal Gastrointestinal Symptoms Reported: No symptoms reported      Genitourinary Genitourinary Symptoms Reported: No symptoms reported    Integumentary Integumentary Symptoms Reported: Wound, Other Other Integumentary Symptoms: Reviewed podiatry surgical provider office visit from 03/22/24 with patient; he verbalizes poor overall understanding of general plan of care; reports he took the dressing off, and told me to keep it clean; I am using clean water to wash it; patient is unable and/or unwilling to elaborate further on the  details of his technique to cleanse (L) foot amputation wounds: I am using soap and clean water; he is not engaged in [facility] call today and is admittedly distracted due to being at the social security office, ready to go in the office; reinforced previously provided education around following strict infection control practices at home/ prevention of and signs/ symptoms infection; reinforced possible complications of acquiring additional infection- patient is somewhat argumentative and confrontational that none of this is anything I can control and he continues to exhibit blaming of health care system/ prior care for the barriers he is experiencing; emotional support offered with discussion around his priority being the healing of his recent amputations- patient provides excuses that he has a farm and animals that 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, which is why he states he can not focus on his own care; he eventually hangs up phone as he is entering the social security office Additional Integumentary Details: 2 recent (L) toe amputations- 5th and great toe: reviewed photo attachments from podiatry office visit 03/22/24- which also reveal wounds to (L) plantar sole at heel and  near toes Skin Management Strategies: Coping strategies, Routine screening, Dressing changes  Musculoskeletal Musculoskelatal Symptoms Reviewed: Other Other Musculoskeletal Symptoms: Patient reports continues wearing post-op surgical boot; he declines discussion around additional assistive devices; states at one point in our conversation I am walking fine Musculoskeletal Management Strategies: Routine screening, Coping strategies, Medical device      Psychosocial Psychosocial Symptoms Reported: Hyperviligence, Anger, Difficulty concentrating, Irritability Additional Psychological Details: Patient yelling loudly throughout [facility] call today: remains very difficult to re-focus/ stay on point: confirmed LCSW and DSS continue active with patient  as per baseline x > 4 months         There were no vitals filed for this visit.  Medications Reviewed Today     Reviewed by Deidrick Rainey M, RN (Registered Nurse) on 03/23/24 at 1517  Med List Status: <None>   Medication Order Taking? Sig Documenting Provider Last Dose Status Informant  amoxicillin -clavulanate (AUGMENTIN ) 875-125 MG tablet [id number]  Take 1 tablet by mouth 2 (two) times daily. Standiford, Marsa FALCON, DPM  Active Self, Pharmacy Records  aspirin  EC 81 MG tablet [id number]  Take 1 tablet (81 mg total) by mouth daily. Swallow whole. Charlyne Reed, PA-C  Active Self, Pharmacy Records  atorvastatin  (LIPITOR ) 40 MG tablet [id number]  TAKE 1 TABLET BY MOUTH ONCE DAILY Norleen Lynwood ORN, MD  Active Self, Pharmacy Records  citalopram  (CELEXA ) 20 MG tablet [id number]  Take 1 tablet (20 mg total) by mouth daily. Norleen Lynwood ORN, MD  Active Self, Pharmacy Records  clopidogrel  (PLAVIX ) 75 MG tablet [id number]  Take 1 tablet (75 mg total) by mouth daily. Norleen Lynwood ORN, MD  Active Self, Pharmacy Records  insulin  glargine, 2 Unit Dial , (TOUJEO  MAX SOLOSTAR) 300 UNIT/ML Solostar Pen [id number]  Inject 35 Units into the skin every morning. Norleen Lynwood ORN, MD  Active   irbesartan  (AVAPRO ) 300 MG tablet [id number]  Take 1 tablet (300 mg total) by mouth daily. Norleen Lynwood ORN, MD  Active Self, Pharmacy Records  loratadine  (CLARITIN ) 10 MG tablet [id number]  Take 10 mg by mouth daily as needed. [provider]  Active Self, Pharmacy Records  pantoprazole  (PROTONIX ) 40 MG tablet [id number]  Take 1 tablet (40 mg total) by mouth daily. Norleen Lynwood ORN, MD  Active Self, Pharmacy Records           Recommendation:   Specialty provider follow-up- endocrinology and podiatry surgical provider as scheduled 04/05/24 Continue Current Plan of Care  Follow Up Plan:   Telephone follow-up in 1 week- as scheduled 03/31/24  Pls call/ message for questions,  Beatris Blinda Lawrence, RN, BSN, CCRN Alumnus RN Care  Manager  Transitions of Care  [facility] - [facility] [phone]: direct office

## 2024-03-25 ENCOUNTER — Encounter (HOSPITAL_COMMUNITY)

## 2024-03-25 ENCOUNTER — Telehealth: Admitting: *Deleted

## 2024-03-25 ENCOUNTER — Encounter

## 2024-03-27 ENCOUNTER — Inpatient Hospital Stay (HOSPITAL_COMMUNITY)
Admission: EM | Admit: 2024-03-27 | Discharge: 2024-04-01 | DRG: 256 | Disposition: A | Discharge status: Home / Self Care | Attending: Internal Medicine | Admitting: Internal Medicine

## 2024-03-27 ENCOUNTER — Encounter (HOSPITAL_COMMUNITY): Payer: Self-pay | Admitting: *Deleted

## 2024-03-27 ENCOUNTER — Other Ambulatory Visit: Payer: Self-pay

## 2024-03-27 DIAGNOSIS — Z833 Family history of diabetes mellitus: Secondary | ICD-10-CM

## 2024-03-27 DIAGNOSIS — M869 Osteomyelitis, unspecified: Secondary | ICD-10-CM | POA: Diagnosis not present

## 2024-03-27 DIAGNOSIS — Z8249 Family history of ischemic heart disease and other diseases of the circulatory system: Secondary | ICD-10-CM | POA: Diagnosis not present

## 2024-03-27 DIAGNOSIS — M79673 Pain in unspecified foot: Secondary | ICD-10-CM | POA: Diagnosis not present

## 2024-03-27 DIAGNOSIS — Z5982 Transportation insecurity: Secondary | ICD-10-CM

## 2024-03-27 DIAGNOSIS — E876 Hypokalemia: Secondary | ICD-10-CM | POA: Diagnosis not present

## 2024-03-27 DIAGNOSIS — M62262 Nontraumatic ischemic infarction of muscle, left lower leg: Secondary | ICD-10-CM | POA: Diagnosis not present

## 2024-03-27 DIAGNOSIS — I480 Paroxysmal atrial fibrillation: Secondary | ICD-10-CM | POA: Diagnosis not present

## 2024-03-27 DIAGNOSIS — L03116 Cellulitis of left lower limb: Principal | ICD-10-CM | POA: Diagnosis present

## 2024-03-27 DIAGNOSIS — E785 Hyperlipidemia, unspecified: Secondary | ICD-10-CM | POA: Diagnosis present

## 2024-03-27 DIAGNOSIS — E1165 Type 2 diabetes mellitus with hyperglycemia: Secondary | ICD-10-CM | POA: Diagnosis present

## 2024-03-27 DIAGNOSIS — L089 Local infection of the skin and subcutaneous tissue, unspecified: Secondary | ICD-10-CM | POA: Diagnosis not present

## 2024-03-27 DIAGNOSIS — J309 Allergic rhinitis, unspecified: Secondary | ICD-10-CM | POA: Diagnosis not present

## 2024-03-27 DIAGNOSIS — Z888 Allergy status to other drugs, medicaments and biological substances status: Secondary | ICD-10-CM

## 2024-03-27 DIAGNOSIS — K219 Gastro-esophageal reflux disease without esophagitis: Secondary | ICD-10-CM | POA: Diagnosis not present

## 2024-03-27 DIAGNOSIS — E1169 Type 2 diabetes mellitus with other specified complication: Secondary | ICD-10-CM | POA: Diagnosis not present

## 2024-03-27 DIAGNOSIS — L97523 Non-pressure chronic ulcer of other part of left foot with necrosis of muscle: Secondary | ICD-10-CM | POA: Diagnosis present

## 2024-03-27 DIAGNOSIS — Z5912 Inadequate housing utilities: Secondary | ICD-10-CM

## 2024-03-27 DIAGNOSIS — I10 Essential (primary) hypertension: Secondary | ICD-10-CM | POA: Diagnosis not present

## 2024-03-27 DIAGNOSIS — F17210 Nicotine dependence, cigarettes, uncomplicated: Secondary | ICD-10-CM | POA: Diagnosis not present

## 2024-03-27 DIAGNOSIS — Y835 Amputation of limb(s) as the cause of abnormal reaction of the patient, or of later complication, without mention of misadventure at the time of the procedure: Secondary | ICD-10-CM | POA: Diagnosis present

## 2024-03-27 DIAGNOSIS — L97522 Non-pressure chronic ulcer of other part of left foot with fat layer exposed: Secondary | ICD-10-CM | POA: Diagnosis not present

## 2024-03-27 DIAGNOSIS — L0390 Cellulitis, unspecified: Secondary | ICD-10-CM | POA: Diagnosis present

## 2024-03-27 DIAGNOSIS — E1152 Type 2 diabetes mellitus with diabetic peripheral angiopathy with gangrene: Secondary | ICD-10-CM | POA: Diagnosis not present

## 2024-03-27 DIAGNOSIS — Z794 Long term (current) use of insulin: Secondary | ICD-10-CM

## 2024-03-27 DIAGNOSIS — I70269 Atherosclerosis of native arteries of extremities with gangrene, unspecified extremity: Secondary | ICD-10-CM | POA: Diagnosis present

## 2024-03-27 DIAGNOSIS — Z89422 Acquired absence of other left toe(s): Secondary | ICD-10-CM

## 2024-03-27 DIAGNOSIS — T8789 Other complications of amputation stump: Secondary | ICD-10-CM | POA: Diagnosis not present

## 2024-03-27 DIAGNOSIS — E1140 Type 2 diabetes mellitus with diabetic neuropathy, unspecified: Secondary | ICD-10-CM | POA: Diagnosis not present

## 2024-03-27 DIAGNOSIS — I2510 Atherosclerotic heart disease of native coronary artery without angina pectoris: Secondary | ICD-10-CM | POA: Diagnosis present

## 2024-03-27 DIAGNOSIS — Z604 Social exclusion and rejection: Secondary | ICD-10-CM | POA: Diagnosis not present

## 2024-03-27 DIAGNOSIS — Z7982 Long term (current) use of aspirin: Secondary | ICD-10-CM

## 2024-03-27 DIAGNOSIS — Z9841 Cataract extraction status, right eye: Secondary | ICD-10-CM

## 2024-03-27 DIAGNOSIS — T8140XA Infection following a procedure, unspecified, initial encounter: Principal | ICD-10-CM | POA: Diagnosis present

## 2024-03-27 DIAGNOSIS — Z7902 Long term (current) use of antithrombotics/antiplatelets: Secondary | ICD-10-CM

## 2024-03-27 DIAGNOSIS — I96 Gangrene, not elsewhere classified: Secondary | ICD-10-CM | POA: Diagnosis not present

## 2024-03-27 DIAGNOSIS — Z8551 Personal history of malignant neoplasm of bladder: Secondary | ICD-10-CM

## 2024-03-27 DIAGNOSIS — Z89412 Acquired absence of left great toe: Secondary | ICD-10-CM | POA: Diagnosis not present

## 2024-03-27 DIAGNOSIS — Z961 Presence of intraocular lens: Secondary | ICD-10-CM | POA: Diagnosis not present

## 2024-03-27 DIAGNOSIS — F32A Depression, unspecified: Secondary | ICD-10-CM | POA: Diagnosis present

## 2024-03-27 DIAGNOSIS — Z79899 Other long term (current) drug therapy: Secondary | ICD-10-CM

## 2024-03-27 DIAGNOSIS — E119 Type 2 diabetes mellitus without complications: Secondary | ICD-10-CM

## 2024-03-27 DIAGNOSIS — M86672 Other chronic osteomyelitis, left ankle and foot: Secondary | ICD-10-CM | POA: Diagnosis not present

## 2024-03-27 DIAGNOSIS — M79672 Pain in left foot: Secondary | ICD-10-CM | POA: Diagnosis not present

## 2024-03-27 DIAGNOSIS — Z860101 Personal history of adenomatous and serrated colon polyps: Secondary | ICD-10-CM

## 2024-03-27 DIAGNOSIS — C679 Malignant neoplasm of bladder, unspecified: Secondary | ICD-10-CM | POA: Diagnosis present

## 2024-03-27 DIAGNOSIS — Z5986 Financial insecurity: Secondary | ICD-10-CM

## 2024-03-27 DIAGNOSIS — I739 Peripheral vascular disease, unspecified: Secondary | ICD-10-CM | POA: Diagnosis present

## 2024-03-27 DIAGNOSIS — E11621 Type 2 diabetes mellitus with foot ulcer: Secondary | ICD-10-CM | POA: Diagnosis present

## 2024-03-27 DIAGNOSIS — E1151 Type 2 diabetes mellitus with diabetic peripheral angiopathy without gangrene: Secondary | ICD-10-CM | POA: Diagnosis not present

## 2024-03-27 DIAGNOSIS — Z9842 Cataract extraction status, left eye: Secondary | ICD-10-CM

## 2024-03-27 DIAGNOSIS — Z95820 Peripheral vascular angioplasty status with implants and grafts: Secondary | ICD-10-CM | POA: Diagnosis not present

## 2024-03-27 LAB — CBC WITH DIFFERENTIAL/PLATELET
Abs Immature Granulocytes: 0.03 K/uL (ref 0.00–0.07)
Basophils Absolute: 0.1 K/uL (ref 0.0–0.1)
Basophils Relative: 1 %
Eosinophils Absolute: 0.5 K/uL (ref 0.0–0.5)
Eosinophils Relative: 4 %
HCT: 38.8 % — ABNORMAL LOW (ref 39.0–52.0)
Hemoglobin: 13.4 g/dL (ref 13.0–17.0)
Immature Granulocytes: 0 %
Lymphocytes Relative: 25 %
Lymphs Abs: 2.6 K/uL (ref 0.7–4.0)
MCH: 32.8 pg (ref 26.0–34.0)
MCHC: 34.5 g/dL (ref 30.0–36.0)
MCV: 94.9 fL (ref 80.0–100.0)
Monocytes Absolute: 0.8 K/uL (ref 0.1–1.0)
Monocytes Relative: 7 %
Neutro Abs: 6.6 K/uL (ref 1.7–7.7)
Neutrophils Relative %: 63 %
Platelets: 220 K/uL (ref 150–400)
RBC: 4.09 MIL/uL — ABNORMAL LOW (ref 4.22–5.81)
RDW: 12.8 % (ref 11.5–15.5)
WBC: 10.6 K/uL — ABNORMAL HIGH (ref 4.0–10.5)
nRBC: 0 % (ref 0.0–0.2)

## 2024-03-27 LAB — CBG MONITORING, ED: Glucose-Capillary: 302 mg/dL — ABNORMAL HIGH (ref 70–99)

## 2024-03-27 NOTE — ED Triage Notes (Signed)
 Painful lt foot since he has had multiple  amputation  and ulcers on his lt foot   the pt is c/o many medicines and surgeries

## 2024-03-28 ENCOUNTER — Observation Stay (HOSPITAL_COMMUNITY)

## 2024-03-28 ENCOUNTER — Encounter (HOSPITAL_COMMUNITY): Payer: Self-pay | Admitting: Internal Medicine

## 2024-03-28 ENCOUNTER — Emergency Department (HOSPITAL_COMMUNITY)

## 2024-03-28 DIAGNOSIS — L0390 Cellulitis, unspecified: Secondary | ICD-10-CM | POA: Diagnosis present

## 2024-03-28 DIAGNOSIS — L97522 Non-pressure chronic ulcer of other part of left foot with fat layer exposed: Secondary | ICD-10-CM | POA: Diagnosis not present

## 2024-03-28 DIAGNOSIS — E1151 Type 2 diabetes mellitus with diabetic peripheral angiopathy without gangrene: Secondary | ICD-10-CM | POA: Diagnosis not present

## 2024-03-28 DIAGNOSIS — S91302A Unspecified open wound, left foot, initial encounter: Secondary | ICD-10-CM | POA: Diagnosis not present

## 2024-03-28 DIAGNOSIS — E876 Hypokalemia: Secondary | ICD-10-CM | POA: Diagnosis not present

## 2024-03-28 DIAGNOSIS — I10 Essential (primary) hypertension: Secondary | ICD-10-CM | POA: Diagnosis not present

## 2024-03-28 DIAGNOSIS — Z794 Long term (current) use of insulin: Secondary | ICD-10-CM

## 2024-03-28 DIAGNOSIS — L97523 Non-pressure chronic ulcer of other part of left foot with necrosis of muscle: Secondary | ICD-10-CM | POA: Diagnosis not present

## 2024-03-28 DIAGNOSIS — K219 Gastro-esophageal reflux disease without esophagitis: Secondary | ICD-10-CM | POA: Diagnosis present

## 2024-03-28 DIAGNOSIS — Z8249 Family history of ischemic heart disease and other diseases of the circulatory system: Secondary | ICD-10-CM | POA: Diagnosis not present

## 2024-03-28 DIAGNOSIS — E1165 Type 2 diabetes mellitus with hyperglycemia: Secondary | ICD-10-CM | POA: Diagnosis present

## 2024-03-28 DIAGNOSIS — Z604 Social exclusion and rejection: Secondary | ICD-10-CM | POA: Diagnosis present

## 2024-03-28 DIAGNOSIS — J309 Allergic rhinitis, unspecified: Secondary | ICD-10-CM | POA: Diagnosis present

## 2024-03-28 DIAGNOSIS — E1169 Type 2 diabetes mellitus with other specified complication: Secondary | ICD-10-CM | POA: Diagnosis present

## 2024-03-28 DIAGNOSIS — I96 Gangrene, not elsewhere classified: Secondary | ICD-10-CM | POA: Diagnosis not present

## 2024-03-28 DIAGNOSIS — Y835 Amputation of limb(s) as the cause of abnormal reaction of the patient, or of later complication, without mention of misadventure at the time of the procedure: Secondary | ICD-10-CM | POA: Diagnosis present

## 2024-03-28 DIAGNOSIS — L03032 Cellulitis of left toe: Secondary | ICD-10-CM | POA: Diagnosis not present

## 2024-03-28 DIAGNOSIS — E119 Type 2 diabetes mellitus without complications: Secondary | ICD-10-CM | POA: Diagnosis not present

## 2024-03-28 DIAGNOSIS — I2510 Atherosclerotic heart disease of native coronary artery without angina pectoris: Secondary | ICD-10-CM | POA: Diagnosis not present

## 2024-03-28 DIAGNOSIS — F32A Depression, unspecified: Secondary | ICD-10-CM | POA: Diagnosis present

## 2024-03-28 DIAGNOSIS — L03116 Cellulitis of left lower limb: Secondary | ICD-10-CM | POA: Diagnosis present

## 2024-03-28 DIAGNOSIS — F17210 Nicotine dependence, cigarettes, uncomplicated: Secondary | ICD-10-CM | POA: Diagnosis not present

## 2024-03-28 DIAGNOSIS — L97529 Non-pressure chronic ulcer of other part of left foot with unspecified severity: Secondary | ICD-10-CM | POA: Diagnosis not present

## 2024-03-28 DIAGNOSIS — Z5912 Inadequate housing utilities: Secondary | ICD-10-CM | POA: Diagnosis not present

## 2024-03-28 DIAGNOSIS — R601 Generalized edema: Secondary | ICD-10-CM | POA: Diagnosis not present

## 2024-03-28 DIAGNOSIS — M62262 Nontraumatic ischemic infarction of muscle, left lower leg: Secondary | ICD-10-CM | POA: Diagnosis not present

## 2024-03-28 DIAGNOSIS — T8789 Other complications of amputation stump: Secondary | ICD-10-CM | POA: Diagnosis not present

## 2024-03-28 DIAGNOSIS — Z89412 Acquired absence of left great toe: Secondary | ICD-10-CM | POA: Diagnosis not present

## 2024-03-28 DIAGNOSIS — Z95820 Peripheral vascular angioplasty status with implants and grafts: Secondary | ICD-10-CM | POA: Diagnosis not present

## 2024-03-28 DIAGNOSIS — M869 Osteomyelitis, unspecified: Secondary | ICD-10-CM | POA: Diagnosis not present

## 2024-03-28 DIAGNOSIS — Z961 Presence of intraocular lens: Secondary | ICD-10-CM | POA: Diagnosis present

## 2024-03-28 DIAGNOSIS — Z9889 Other specified postprocedural states: Secondary | ICD-10-CM | POA: Diagnosis not present

## 2024-03-28 DIAGNOSIS — M79673 Pain in unspecified foot: Secondary | ICD-10-CM | POA: Diagnosis not present

## 2024-03-28 DIAGNOSIS — T8140XA Infection following a procedure, unspecified, initial encounter: Secondary | ICD-10-CM | POA: Diagnosis present

## 2024-03-28 DIAGNOSIS — I480 Paroxysmal atrial fibrillation: Secondary | ICD-10-CM | POA: Diagnosis not present

## 2024-03-28 DIAGNOSIS — M898X7 Other specified disorders of bone, ankle and foot: Secondary | ICD-10-CM | POA: Diagnosis not present

## 2024-03-28 DIAGNOSIS — E1140 Type 2 diabetes mellitus with diabetic neuropathy, unspecified: Secondary | ICD-10-CM | POA: Diagnosis present

## 2024-03-28 DIAGNOSIS — Z833 Family history of diabetes mellitus: Secondary | ICD-10-CM | POA: Diagnosis not present

## 2024-03-28 DIAGNOSIS — E11621 Type 2 diabetes mellitus with foot ulcer: Secondary | ICD-10-CM | POA: Diagnosis present

## 2024-03-28 DIAGNOSIS — E1152 Type 2 diabetes mellitus with diabetic peripheral angiopathy with gangrene: Secondary | ICD-10-CM | POA: Diagnosis not present

## 2024-03-28 DIAGNOSIS — R600 Localized edema: Secondary | ICD-10-CM | POA: Diagnosis not present

## 2024-03-28 DIAGNOSIS — M79672 Pain in left foot: Secondary | ICD-10-CM | POA: Diagnosis not present

## 2024-03-28 DIAGNOSIS — M86672 Other chronic osteomyelitis, left ankle and foot: Secondary | ICD-10-CM | POA: Diagnosis not present

## 2024-03-28 DIAGNOSIS — Z89422 Acquired absence of other left toe(s): Secondary | ICD-10-CM | POA: Diagnosis not present

## 2024-03-28 DIAGNOSIS — E785 Hyperlipidemia, unspecified: Secondary | ICD-10-CM | POA: Diagnosis present

## 2024-03-28 LAB — URINALYSIS, W/ REFLEX TO CULTURE (INFECTION SUSPECTED)
Bilirubin Urine: NEGATIVE
Glucose, UA: >=500 mg/dL — AB
Hgb urine dipstick: NEGATIVE
Ketones, ur: NEGATIVE mg/dL
Nitrite: NEGATIVE
Protein, ur: 30 mg/dL — AB
Specific Gravity, Urine: 1.01 (ref 1.005–1.030)
pH: 5 (ref 5.0–8.0)

## 2024-03-28 LAB — CBC WITH DIFFERENTIAL/PLATELET
Abs Immature Granulocytes: 0.03 K/uL (ref 0.00–0.07)
Basophils Absolute: 0.1 K/uL (ref 0.0–0.1)
Basophils Relative: 1 %
Eosinophils Absolute: 0.5 K/uL (ref 0.0–0.5)
Eosinophils Relative: 5 %
HCT: 37.9 % — ABNORMAL LOW (ref 39.0–52.0)
Hemoglobin: 13.2 g/dL (ref 13.0–17.0)
Immature Granulocytes: 0 %
Lymphocytes Relative: 22 %
Lymphs Abs: 2.2 K/uL (ref 0.7–4.0)
MCH: 32.3 pg (ref 26.0–34.0)
MCHC: 34.8 g/dL (ref 30.0–36.0)
MCV: 92.7 fL (ref 80.0–100.0)
Monocytes Absolute: 0.7 K/uL (ref 0.1–1.0)
Monocytes Relative: 7 %
Neutro Abs: 6.7 K/uL (ref 1.7–7.7)
Neutrophils Relative %: 65 %
Platelets: 200 K/uL (ref 150–400)
RBC: 4.09 MIL/uL — ABNORMAL LOW (ref 4.22–5.81)
RDW: 13 % (ref 11.5–15.5)
WBC: 10.3 K/uL (ref 4.0–10.5)
nRBC: 0 % (ref 0.0–0.2)

## 2024-03-28 LAB — GLUCOSE, CAPILLARY
Glucose-Capillary: 154 mg/dL — ABNORMAL HIGH (ref 70–99)
Glucose-Capillary: 88 mg/dL (ref 70–99)

## 2024-03-28 LAB — HEPATIC FUNCTION PANEL
ALT: 21 U/L (ref 0–44)
AST: 14 U/L — ABNORMAL LOW (ref 15–41)
Albumin: 2.4 g/dL — ABNORMAL LOW (ref 3.5–5.0)
Alkaline Phosphatase: 63 U/L (ref 38–126)
Bilirubin, Direct: 0.2 mg/dL (ref 0.0–0.2)
Indirect Bilirubin: 1 mg/dL — ABNORMAL HIGH (ref 0.3–0.9)
Total Bilirubin: 1.2 mg/dL (ref 0.0–1.2)
Total Protein: 5.7 g/dL — ABNORMAL LOW (ref 6.5–8.1)

## 2024-03-28 LAB — COMPREHENSIVE METABOLIC PANEL WITH GFR
ALT: 27 U/L (ref 0–44)
AST: 15 U/L (ref 15–41)
Albumin: 2.9 g/dL — ABNORMAL LOW (ref 3.5–5.0)
Alkaline Phosphatase: 77 U/L (ref 38–126)
Anion gap: 9 (ref 5–15)
BUN: 9 mg/dL (ref 8–23)
CO2: 27 mmol/L (ref 22–32)
Calcium: 8.8 mg/dL — ABNORMAL LOW (ref 8.9–10.3)
Chloride: 100 mmol/L (ref 98–111)
Creatinine, Ser: 0.82 mg/dL (ref 0.61–1.24)
GFR, Estimated: >60 mL/min (ref >60)
Glucose, Bld: 309 mg/dL — ABNORMAL HIGH (ref 70–99)
Potassium: 3.3 mmol/L — ABNORMAL LOW (ref 3.5–5.1)
Sodium: 136 mmol/L (ref 135–145)
Total Bilirubin: 0.7 mg/dL (ref 0.0–1.2)
Total Protein: 6.5 g/dL (ref 6.5–8.1)

## 2024-03-28 LAB — BASIC METABOLIC PANEL WITH GFR
Anion gap: 3 — ABNORMAL LOW (ref 5–15)
BUN: 9 mg/dL (ref 8–23)
CO2: 27 mmol/L (ref 22–32)
Calcium: 8.6 mg/dL — ABNORMAL LOW (ref 8.9–10.3)
Chloride: 107 mmol/L (ref 98–111)
Creatinine, Ser: 0.68 mg/dL (ref 0.61–1.24)
GFR, Estimated: >60 mL/min (ref >60)
Glucose, Bld: 135 mg/dL — ABNORMAL HIGH (ref 70–99)
Potassium: 3.3 mmol/L — ABNORMAL LOW (ref 3.5–5.1)
Sodium: 137 mmol/L (ref 135–145)

## 2024-03-28 LAB — PROTIME-INR
INR: 1 (ref 0.8–1.2)
Prothrombin Time: 13.3 s (ref 11.4–15.2)

## 2024-03-28 LAB — VAS US ABI WITH/WO TBI
Left ABI: 1.12
Right ABI: 0.61

## 2024-03-28 LAB — I-STAT CG4 LACTIC ACID, ED: Lactic Acid, Venous: 1.5 mmol/L (ref 0.5–1.9)

## 2024-03-28 LAB — SEDIMENTATION RATE: Sed Rate: 11 mm/h (ref 0–16)

## 2024-03-28 LAB — CBG MONITORING, ED
Glucose-Capillary: 113 mg/dL — ABNORMAL HIGH (ref 70–99)
Glucose-Capillary: 259 mg/dL — ABNORMAL HIGH (ref 70–99)

## 2024-03-28 LAB — C-REACTIVE PROTEIN: CRP: <0.5 mg/dL (ref <1.0)

## 2024-03-28 MED ORDER — INSULIN ASPART 100 UNIT/ML IJ SOLN
0.0000 [IU] | Freq: Three times a day (TID) | INTRAMUSCULAR | Status: DC
  Administered 2024-03-28: 3 [IU] via SUBCUTANEOUS
  Administered 2024-03-28: 8 [IU] via SUBCUTANEOUS
  Administered 2024-03-29: 3 [IU] via SUBCUTANEOUS
  Administered 2024-03-30: 2 [IU] via SUBCUTANEOUS
  Administered 2024-03-30: 8 [IU] via SUBCUTANEOUS
  Administered 2024-03-31 – 2024-04-01 (×4): 3 [IU] via SUBCUTANEOUS

## 2024-03-28 MED ORDER — IRBESARTAN 300 MG PO TABS
300.0000 mg | ORAL_TABLET | Freq: Every day | ORAL | Status: DC
  Administered 2024-03-28 – 2024-04-01 (×5): 300 mg via ORAL
  Filled 2024-03-28 (×5): qty 1

## 2024-03-28 MED ORDER — POTASSIUM CHLORIDE CRYS ER 20 MEQ PO TBCR
40.0000 meq | EXTENDED_RELEASE_TABLET | Freq: Once | ORAL | Status: AC
  Administered 2024-03-28: 40 meq via ORAL
  Filled 2024-03-28: qty 2

## 2024-03-28 MED ORDER — LACTATED RINGERS IV SOLN: INTRAVENOUS | Status: AC

## 2024-03-28 MED ORDER — VANCOMYCIN HCL 750 MG/150ML IV SOLN
750.0000 mg | Freq: Two times a day (BID) | INTRAVENOUS | Status: DC
  Administered 2024-03-28 – 2024-03-31 (×6): 750 mg via INTRAVENOUS
  Filled 2024-03-28 (×6): qty 150

## 2024-03-28 MED ORDER — PANTOPRAZOLE SODIUM 40 MG PO TBEC
40.0000 mg | DELAYED_RELEASE_TABLET | Freq: Every day | ORAL | Status: DC
Start: 2024-03-28 — End: 2024-04-01
  Administered 2024-03-28 – 2024-04-01 (×5): 40 mg via ORAL
  Filled 2024-03-28 (×5): qty 1

## 2024-03-28 MED ORDER — VANCOMYCIN HCL 1750 MG/350ML IV SOLN
1750.0000 mg | Freq: Once | INTRAVENOUS | Status: AC
  Administered 2024-03-28: 1750 mg via INTRAVENOUS
  Filled 2024-03-28: qty 350

## 2024-03-28 MED ORDER — ASPIRIN 81 MG PO TBEC
81.0000 mg | DELAYED_RELEASE_TABLET | Freq: Every day | ORAL | Status: DC
  Administered 2024-03-28 – 2024-04-01 (×4): 81 mg via ORAL
  Filled 2024-03-28 (×4): qty 1

## 2024-03-28 MED ORDER — INSULIN GLARGINE 100 UNIT/ML ~~LOC~~ SOLN
35.0000 [IU] | Freq: Every day | SUBCUTANEOUS | Status: DC
  Administered 2024-03-28 – 2024-04-01 (×4): 35 [IU] via SUBCUTANEOUS
  Filled 2024-03-28 (×5): qty 0.35

## 2024-03-28 MED ORDER — CITALOPRAM HYDROBROMIDE 20 MG PO TABS
20.0000 mg | ORAL_TABLET | Freq: Every day | ORAL | Status: DC
  Administered 2024-03-28 – 2024-04-01 (×5): 20 mg via ORAL
  Filled 2024-03-28: qty 2
  Filled 2024-03-28 (×4): qty 1

## 2024-03-28 MED ORDER — ACETAMINOPHEN 650 MG RE SUPP: 650.0000 mg | Freq: Four times a day (QID) | RECTAL | Status: DC | PRN

## 2024-03-28 MED ORDER — SODIUM CHLORIDE 0.9 % IV SOLN
3.0000 g | Freq: Four times a day (QID) | INTRAVENOUS | Status: DC
  Administered 2024-03-28 – 2024-03-31 (×12): 3 g via INTRAVENOUS
  Filled 2024-03-28 (×12): qty 8

## 2024-03-28 MED ORDER — CLOPIDOGREL BISULFATE 75 MG PO TABS
75.0000 mg | ORAL_TABLET | Freq: Every day | ORAL | Status: DC
  Filled 2024-03-28: qty 1

## 2024-03-28 MED ORDER — ACETAMINOPHEN 325 MG PO TABS: 650.0000 mg | ORAL_TABLET | Freq: Four times a day (QID) | ORAL | Status: DC | PRN

## 2024-03-28 MED ORDER — INSULIN GLARGINE (2 UNIT DIAL) 300 UNIT/ML ~~LOC~~ SOPN: 35.0000 [IU] | PEN_INJECTOR | Freq: Every morning | SUBCUTANEOUS | Status: DC

## 2024-03-28 MED ORDER — ATORVASTATIN CALCIUM 40 MG PO TABS
40.0000 mg | ORAL_TABLET | Freq: Every day | ORAL | Status: DC
  Administered 2024-03-28 – 2024-04-01 (×5): 40 mg via ORAL
  Filled 2024-03-28 (×5): qty 1

## 2024-03-28 NOTE — ED Notes (Signed)
 Pt to MRI

## 2024-03-28 NOTE — Progress Notes (Signed)
 Pharmacy Antibiotic Note  [name] is a 68 y.o. male admitted on 03/27/2024 with Foot pain.  Pharmacy has been consulted for Unasyn /vancomycin  dosing for wound infection. PMH includes PVD, T2DM  -7/21: amputation of left great toe and has completed course of Augmentin  -No osteo seen on imaging  -Vanc x1 in ED -WBC 10.6, afebrile, sCr ~bl; blood cultures collected  Plan: -Unasyn  3g IV every 6 hours -Vancomycin  1750mg  IV x1 -Vancomycin  750mg  IV every 12 hours (AUC 404, Vd 0.72, IBW, sCr 0.82) -Monitor renal function -Follow up signs of clinical improvement, LOT, de-escalation of antibiotics  -Follow up podiatry consult   Height: 5' 8 (172.7 cm) Weight: 69 kg (152 lb 1.9 oz) IBW/kg (Calculated) : 68.4  Temp (24hrs), Avg:97.5 F (36.4 C), Min:97.5 F (36.4 C), Max:97.5 F (36.4 C)  Recent Labs  Lab 03/27/24 2336 03/28/24 0011  WBC 10.6*  --   CREATININE 0.82  --   LATICACIDVEN  --  1.5    Estimated Creatinine Clearance: 84.6 mL/min (by C-G formula based on SCr of 0.82 mg/dL).    Allergies  Allergen Reactions   Cefepime  Hives    Had allergic reaction to one of these 3 agents, unclear which one   Metoprolol  Hives    Had allergic reaction to one of these 3 agents, unclear which one  *Per RN, highly likely this is the cause of allergic rxn   Myrbetriq  [Mirabegron ] Hives    Had allergic reaction to one of these 3 agents, unclear which one    Antimicrobials this admission: Unasyn  8/18 >>  Vancomycin  8/18 >>   Microbiology results: 8/18 BCx:   Thank you for allowing pharmacy to be a part of this patient's care.  Lynwood Poplar, PharmD, BCPS Clinical Pharmacist 03/28/2024 6:05 AM

## 2024-03-28 NOTE — Progress Notes (Signed)
 VASCULAR LAB    Left lower extremity arterial duplex has been performed.  See CV proc for preliminary results.   Tanis Burnley, RVT 03/28/2024, 11:43 AM

## 2024-03-28 NOTE — ED Notes (Signed)
Pt to vasc US 

## 2024-03-28 NOTE — ED Provider Notes (Signed)
 [place] EMERGENCY DEPARTMENT AT [facility] Provider Note   CSN: [id number] Arrival date & time: 03/27/24  2245     Patient presents with: Foot Pain   [name] is a 68 y.o. male.   The history is provided by the patient.  Patient with extensive history including CAD, hypertension, diabetes presents for left foot pain.  Patient reports he recently underwent a left great toe amputation.  He reports he is walking frequently and is concerned that the foot is now infected.  He was recently seen by his podiatrist last week, but his symptoms have worsened.  No fevers or chills.  He does report increasing pain, swelling and drainage Denies any other new complaints    Past Medical History:  Diagnosis Date   Adenomatous colon polyp    Allergic rhinitis    At risk for sleep apnea    STOP-BANG= 5   SENT TO PCP 03-14-2014   Bladder cancer (HCC)    CAD (coronary artery disease)    a. 07/2014 low risk MV; b. 07/2019 Cor CTA (FFR): LM 25-49 (nl), LAD mild prox/mid plaque (nl), D1 25-49p(nl w/ abnl FFR of 0.73 in inf branch), LCX/OM1 mild prox/mid plaque (nl), RCA nondominant, minimal Ca2+ plaque (nl), RPDA (mildly abnl @ 0.79)-->Med Rx..   Cataract    surgically removed bilateral   Condyloma acuminatum of penis    Diabetic neuropathy (HCC)    Diastolic dysfunction    a. 05/2019 Echo: EF 60-65%, no rwam, mod LVH, impaired relaxation, nl RV size/fxn, trace MR, triv TR.   Diverticulosis    GERD (gastroesophageal reflux disease)    History of bladder cancer    s/p  turbt  2013/   transitional cell carcinoma--    History of condyloma acuminatum    PERINEAL AREA  W/ RECURRENCY   History of gout    Hyperlipidemia    Hypertension    Hypoxemia 03/09/2024   Lower urinary tract symptoms (LUTS)    PAF (paroxysmal atrial fibrillation) (HCC)    a. 06/2019 Event monitor: PAF <1% burden. Longest 3 mins 36 secs.   Productive cough    PSVT (paroxysmal supraventricular tachycardia)  (HCC)    a. 06/2019 Event monitor: 112 episodes of SVT, longest 21 secs.   PVD (peripheral vascular disease) with claudication (HCC)    a. 03/2014 LE art duplex: long segment occlusion of mid to distal R SFA; b. 03/2020 ABI: nl left and mildly improved R ABI->med rx.   Renal artery stenosis (HCC)    Renal artery stenosis (HCC)    a. 12/2020 Renal art duplex: RRA 1-59%, LRA >60%.   Smokers' cough (HCC)    Type 2 diabetes mellitus with insulin  therapy (HCC) 1992   monitor by  dr ellsion   Wears dentures    upper    Prior to Admission medications   Medication Sig Start Date End Date Taking? Authorizing Provider  amoxicillin -clavulanate (AUGMENTIN ) 875-125 MG tablet Take 1 tablet by mouth 2 (two) times daily. 03/08/24   Standiford, Marsa FALCON, DPM  aspirin  EC 81 MG tablet Take 1 tablet (81 mg total) by mouth daily. Swallow whole. 02/17/24   Baglia, Corrina, PA-C  atorvastatin  (LIPITOR ) 40 MG tablet TAKE 1 TABLET BY MOUTH ONCE DAILY 02/22/24   Norleen Lynwood LELON, MD  citalopram  (CELEXA ) 20 MG tablet Take 1 tablet (20 mg total) by mouth daily. 02/22/24   Norleen Lynwood LELON, MD  clopidogrel  (PLAVIX ) 75 MG tablet Take 1 tablet (75 mg total)  by mouth daily. 02/22/24 02/21/25  Norleen Lynwood ORN, MD  insulin  glargine, 2 Unit Dial , (TOUJEO  MAX SOLOSTAR) 300 UNIT/ML Solostar Pen Inject 35 Units into the skin every morning. 03/16/24   Norleen Lynwood ORN, MD  irbesartan  (AVAPRO ) 300 MG tablet Take 1 tablet (300 mg total) by mouth daily. 02/22/24   Norleen Lynwood ORN, MD  loratadine  (CLARITIN ) 10 MG tablet Take 10 mg by mouth daily as needed.    [provider]  pantoprazole  (PROTONIX ) 40 MG tablet Take 1 tablet (40 mg total) by mouth daily. 02/22/24   Norleen Lynwood ORN, MD    Allergies: Cefepime , Metoprolol , and Myrbetriq  [mirabegron ]    Review of Systems  Constitutional:  Negative for fever.  Skin:  Positive for wound.    Updated Vital Signs BP (!) 151/59 (BP Location: Right Arm)   Pulse 71   Temp (!) 97.5 F (36.4 C)  (Oral)   Resp 18   Ht 1.727 m (5' 8)   Wt 69 kg   SpO2 100%   BMI 23.13 kg/m   Physical Exam CONSTITUTIONAL: Disheveled HEAD: Normocephalic/atraumatic LUNGS:no apparent distress ABDOMEN: soft NEURO: Pt is awake/alert/appropriate, moves all extremitiesx4.  No facial droop.   EXTREMITIES: Distal pulses equal intact to both feet The left foot is swollen and tender.  There is active drainage from the amputation site with overlying erythema SKIN: warm, see photo PSYCH: no abnormalities of mood noted, alert and oriented to situation  Patient gave verbal permission to utilize photo for medical documentation only The image was not stored on any personal device      (all labs ordered are listed, but only abnormal results are displayed) Labs Reviewed  COMPREHENSIVE METABOLIC PANEL WITH GFR - Abnormal; Notable for the following components:      Result Value   Potassium 3.3 (*)    Glucose, Bld 309 (*)    Calcium  8.8 (*)    Albumin 2.9 (*)    All other components within normal limits  CBC WITH DIFFERENTIAL/PLATELET - Abnormal; Notable for the following components:   WBC 10.6 (*)    RBC 4.09 (*)    HCT 38.8 (*)    All other components within normal limits  URINALYSIS, W/ REFLEX TO CULTURE (INFECTION SUSPECTED) - Abnormal; Notable for the following components:   Glucose, UA >=500 (*)    Protein, ur 30 (*)    Leukocytes,Ua MODERATE (*)    Bacteria, UA RARE (*)    All other components within normal limits  CBG MONITORING, ED - Abnormal; Notable for the following components:   Glucose-Capillary 302 (*)    All other components within normal limits  CULTURE, BLOOD (ROUTINE X 2)  CULTURE, BLOOD (ROUTINE X 2)  PROTIME-INR  I-STAT CG4 LACTIC ACID, ED    EKG: None  Radiology: DG Foot Complete Left Result Date: 03/28/2024 CLINICAL DATA:  Multiple amputations.  Pain.  Ulcers. EXAM: LEFT FOOT - COMPLETE 3+ VIEW COMPARISON:  02/29/2024 FINDINGS: Prior great toe and 5th toe  amputation. No acute bone destruction to suggest acute osteomyelitis. No acute bony abnormality. Specifically, no fracture, subluxation, or dislocation. No soft tissue gas. IMPRESSION: No acute bony abnormality. Electronically Signed   By: Franky Crease M.D.   On: 03/28/2024 01:56     Procedures   Medications Ordered in the ED  vancomycin  (VANCOREADY) IVPB 1750 mg/350 mL (1,750 mg Intravenous New Bag/Given 03/28/24 0241)    Clinical Course as of 03/28/24 0253  Mon Mar 28, 2024  0208 WBC(!): 10.6  Mild leukocytosis [DW]  0208 Glucose(!): 309 Hyperglycemia [DW]  0216 Patient extensive medical conditions but most recently had amputation of his left great toe is having difficulty with wound care.  Patient is had multiple social determinants of health that impact his care including his utilities have been shut off at home, he has to walk frequently and has very little income.  He has had difficulty managing his wounds. [DW]  0216 His x-ray is overall unremarkable, but given the appearance of the wound, I feel admission, antibiotics and podiatry consult are warranted [DW]  0252 Patient will be admitted to the hospitalist service under the care of Dr. Franky Patient will be admitted for IV antibiotics, wound care and podiatry consultation Patient with very complicated medical history including poorly controlled diabetes [DW]    Clinical Course User Index [DW] Midge Golas, MD                                 Medical Decision Making Amount and/or Complexity of Data Reviewed Labs:  Decision-making details documented in ED Course. Radiology: ordered.  Risk Prescription drug management. Decision regarding hospitalization.        Final diagnoses:  Cellulitis of left foot  Postoperative infection, unspecified type, initial encounter    ED Discharge Orders     None          Midge Golas, MD 03/28/24 [phone]

## 2024-03-28 NOTE — Plan of Care (Signed)
  Problem: Education: Goal: Ability to describe self-care measures that may prevent or decrease complications (Diabetes Survival Skills Education) will improve Outcome: Progressing   Problem: Skin Integrity: Goal: Risk for impaired skin integrity will decrease Outcome: Progressing   Problem: Clinical Measurements: Goal: Ability to maintain clinical measurements within normal limits will improve Outcome: Progressing

## 2024-03-28 NOTE — Consult Note (Addendum)
 PODIATRY CONSULTATION  NAME [name] MRN [id number] DOB 1956-06-26 DOA 03/27/2024   Reason for consult:  Chief Complaint  Patient presents with   Foot Pain    Attending/Consulting physician: FABIENE Lam MD  History of present illness: [name] is a 68 y.o. male with history of peripheral artery disease status post recent left lower extremity SFA above-knee popliteal artery stenting with prior history of left small toe amputation for osteomyelitis in May 2025 and also recent admission for gangrene/osteomyelitis distal tip of the left great toe status post amputation presents to the ER with worsening left foot swelling and increasing discharge from the ulcer on the plantar aspect of the foot.  Patient has recently followed up with podiatrist Dr. Malvin on March 22, 2024.  Patient was noticed to have ulcer on the plantar aspect of the left foot overlying the first metatarsal phalangeal joint which has worsened from prior.  Patient also noticed increased discharge.  There was minimal bleeding on debridement concerning for ischemia.  Patient denies any fever or chills.  Patient presents because of a worsening swelling and increased discharge from the ulcer on the plantar aspect of the foot overlying the first metatarsal phalangeal joint.   Pt is well known to me from clinic. Has several wounds on the left foot s/p prior 5th toe amputation followed by subsequent partial L hallux amputation with heel wound debridement. Has been having worsening ulcer without healing plantar 1st MPJ area. Came in due to worsening drainage and swelling at the area.   Past Medical History:  Diagnosis Date   Adenomatous colon polyp    Allergic rhinitis    At risk for sleep apnea    STOP-BANG= 5   SENT TO PCP 03-14-2014   Bladder cancer (HCC)    CAD (coronary artery disease)    a. 07/2014 low risk MV; b. 07/2019 Cor CTA (FFR): LM 25-49 (nl), LAD mild prox/mid plaque (nl), D1 25-49p(nl w/ abnl FFR of  0.73 in inf branch), LCX/OM1 mild prox/mid plaque (nl), RCA nondominant, minimal Ca2+ plaque (nl), RPDA (mildly abnl @ 0.79)-->Med Rx..   Cataract    surgically removed bilateral   Condyloma acuminatum of penis    Diabetic neuropathy (HCC)    Diastolic dysfunction    a. 05/2019 Echo: EF 60-65%, no rwam, mod LVH, impaired relaxation, nl RV size/fxn, trace MR, triv TR.   Diverticulosis    GERD (gastroesophageal reflux disease)    History of bladder cancer    s/p  turbt  2013/   transitional cell carcinoma--    History of condyloma acuminatum    PERINEAL AREA  W/ RECURRENCY   History of gout    Hyperlipidemia    Hypertension    Hypoxemia 03/09/2024   Lower urinary tract symptoms (LUTS)    PAF (paroxysmal atrial fibrillation) (HCC)    a. 06/2019 Event monitor: PAF <1% burden. Longest 3 mins 36 secs.   Productive cough    PSVT (paroxysmal supraventricular tachycardia) (HCC)    a. 06/2019 Event monitor: 112 episodes of SVT, longest 21 secs.   PVD (peripheral vascular disease) with claudication (HCC)    a. 03/2014 LE art duplex: long segment occlusion of mid to distal R SFA; b. 03/2020 ABI: nl left and mildly improved R ABI->med rx.   Renal artery stenosis (HCC)    Renal artery stenosis (HCC)    a. 12/2020 Renal art duplex: RRA 1-59%, LRA >60%.   Smokers' cough (HCC)    Type 2  diabetes mellitus with insulin  therapy (HCC) 1992   monitor by  dr ellsion   Wears dentures    upper       Latest Ref Rng & Units 03/28/2024    6:20 AM 03/27/2024   11:36 PM 03/10/2024    2:41 AM  CBC  WBC 4.0 - 10.5 K/uL 10.3  10.6  6.4   Hemoglobin 13.0 - 17.0 g/dL 86.7  86.5  88.3   Hematocrit 39.0 - 52.0 % 37.9  38.8  33.5   Platelets 150 - 400 K/uL 200  220  233        Latest Ref Rng & Units 03/28/2024    6:20 AM 03/27/2024   11:36 PM 03/10/2024    2:41 AM  BMP  Glucose 70 - 99 mg/dL 864  690  721   BUN 8 - 23 mg/dL 9  9  12    Creatinine 0.61 - 1.24 mg/dL 9.31  9.17  9.09   Sodium 135 - 145 mmol/L  137  136  131   Potassium 3.5 - 5.1 mmol/L 3.3  3.3  3.4   Chloride 98 - 111 mmol/L 107  100  97   CO2 22 - 32 mmol/L 27  27  26    Calcium  8.9 - 10.3 mg/dL 8.6  8.8  8.1       Physical Exam: Lower Extremity Exam  Left foot DP palpable, pt palpable  Ulceration plantar aspect 1st mpj with necrotic fat though less macerated than prior. Mild malodor  Left hallux amputation is coapted, does have some erythmea and necrosis at the amputation site.   Left 5th toe amp site is improved and healing in  Left plantar heel wound healed         ASSESSMENT/PLAN OF CARE 69 y.o. male with PMHx significant for  CAD, diabetes, peripheral artery disease status post recent left lower extremity SFA above-knee popliteal artery stenting with prior history of left small toe amputation for osteomyelitis in May 2025  and prior L hallux amputation with heel wound debridement 02/29/24 with nonhealing ulceration of the plantar 1st MPJ on the left foot and necrosis along the hallux amputation site.  WBC 10.3 CRP 0.5 ESR p   MRI foot L wo contrast: . Interval increased diffuse subcutaneous edema with apparent new skin ulceration in the medial plantar aspect of the forefoot, suspicious for soft tissue infection. No organized fluid collection, foreign body or definite soft tissue emphysema. 2. Stable postsurgical changes from previous amputation of the small toe and more recent amputation through the head of the 1st proximal phalanx. There is nonspecific mild irregularity along the amputation margin of the 1st proximal phalanx with a small amount of adjacent fluid. No progressive cortical destruction identified to strongly suggest recurrent osteomyelitis. 3. Minimal T2 hyperintensity within the 5th metatarsal head without cortical destruction or T1 marrow signal abnormality, likely reactive.  - Recommend we proceed with revision amputation of the left great toe given concern for possible OM and necrosis at  the stump site. In additional will debride and possibly graft/vac plantar forefoot ulceration.  NPO p MN for OR tomorrow around 11:30 - Appreciate vascular consult, optimized for forefoot surgery , checking abi pvr today out of caution.  - Continue IV abx broad spectrum pending further culture data - Anticoagulation: hold pending OR - Wound care: Dry gauze dressing pre op - WB status: WBAT in post op shoe - Will continue to follow   Thank you for the consult.  Please  contact me directly with any questions or concerns.           Marolyn JULIANNA Honour, DPM [facility] / [facility]    [address] [place], [place] [address]                Office [phone]  Fax [phone]

## 2024-03-28 NOTE — ED Notes (Signed)
 Pt to floor with transport. Floor aware.

## 2024-03-28 NOTE — Consult Note (Signed)
 Hospital Consult    Reason for Consult:  Concern for left foot ischemia  Requesting Physician:  ED MRN #:  [id number]  History of Present Illness: This is a 68 y.o. male well-known to the vascular surgery service line having previously undergone left-sided SFA stenting for critical limb ischemia with tissue loss.  He underwent first toe amputation.  At home, the patient had no running water, no electricity.  This was known at the time of his last admission, and his hospital team worked diligently to try for safe discharge, however he was adamant that he would be going home.    Patient has been changed his dressing.  There are some concern he has been using rain water to clean his wound.   Vascular surgery was called due to worsening of the left foot wound.  Patient was seen in clinic roughly 3 weeks ago with patent stent, palpable pulse in the foot.    Past Medical History:  Diagnosis Date   Adenomatous colon polyp    Allergic rhinitis    At risk for sleep apnea    STOP-BANG= 5   SENT TO PCP 03-14-2014   Bladder cancer (HCC)    CAD (coronary artery disease)    a. 07/2014 low risk MV; b. 07/2019 Cor CTA (FFR): LM 25-49 (nl), LAD mild prox/mid plaque (nl), D1 25-49p(nl w/ abnl FFR of 0.73 in inf branch), LCX/OM1 mild prox/mid plaque (nl), RCA nondominant, minimal Ca2+ plaque (nl), RPDA (mildly abnl @ 0.79)-->Med Rx..   Cataract    surgically removed bilateral   Condyloma acuminatum of penis    Diabetic neuropathy (HCC)    Diastolic dysfunction    a. 05/2019 Echo: EF 60-65%, no rwam, mod LVH, impaired relaxation, nl RV size/fxn, trace MR, triv TR.   Diverticulosis    GERD (gastroesophageal reflux disease)    History of bladder cancer    s/p  turbt  2013/   transitional cell carcinoma--    History of condyloma acuminatum    PERINEAL AREA  W/ RECURRENCY   History of gout    Hyperlipidemia    Hypertension    Hypoxemia 03/09/2024   Lower urinary tract symptoms (LUTS)    PAF  (paroxysmal atrial fibrillation) (HCC)    a. 06/2019 Event monitor: PAF <1% burden. Longest 3 mins 36 secs.   Productive cough    PSVT (paroxysmal supraventricular tachycardia) (HCC)    a. 06/2019 Event monitor: 112 episodes of SVT, longest 21 secs.   PVD (peripheral vascular disease) with claudication (HCC)    a. 03/2014 LE art duplex: long segment occlusion of mid to distal R SFA; b. 03/2020 ABI: nl left and mildly improved R ABI->med rx.   Renal artery stenosis (HCC)    Renal artery stenosis (HCC)    a. 12/2020 Renal art duplex: RRA 1-59%, LRA >60%.   Smokers' cough (HCC)    Type 2 diabetes mellitus with insulin  therapy [facility]) 1992   monitor by  dr ellsion   Wears dentures    upper    Past Surgical History:  Procedure Laterality Date   ABDOMINAL AORTOGRAM W/LOWER EXTREMITY N/A 02/15/2024   Procedure: ABDOMINAL AORTOGRAM W/LOWER EXTREMITY;  Surgeon: Pearline Norman RAMAN, MD;  Location: [facility] INVASIVE CV LAB;  Service: Cardiovascular;  Laterality: N/A;   AMPUTATION Left 01/02/2024   Procedure: AMPUTATION, FOOT, RAY;  Surgeon: Malvin Marsa FALCON, DPM;  Location: [facility] OR;  Service: Orthopedics/Podiatry;  Laterality: Left;  FIFTH TOE   AMPUTATION TOE Left 02/29/2024   Procedure:  AMPUTATION, LEFT GREAT TOE;  Surgeon: Malvin Marsa FALCON, DPM;  Location: [facility] OR;  Service: Orthopedics/Podiatry;  Laterality: Left;  Left Great toe partial amputation,  Debridement Left Heel   AXILLARY HIDRADENITIS EXCISION  1997   CARDIOVASCULAR STRESS TEST  07-24-2014  dr darron grass   Low risk lexiscan  nuclear study with apical thinning and small inferolateral wall infarct at mid & basal level , no ischemia/  normal LVF and wall motion , ef 59%   CATARACT EXTRACTION Left    CATARACT EXTRACTION W/ INTRAOCULAR LENS IMPLANT Right    CO2 LASER APPLICATION N/A 03/20/2014   Procedure: CO2 LASER APPLICATION,PENIS, GROIN, ANUS;  Surgeon: Elspeth KYM Schultze, MD;  Location: [facility];  Service: General;   Laterality: N/A;   CO2 LASER APPLICATION N/A 05/21/2015   Procedure: CO2 LASER APPLICATION;  Surgeon: Mark Ottelin, MD;  Location: [facility];  Service: Urology;  Laterality: N/A;   CONDYLOMA EXCISION/FULGURATION N/A 05/21/2015   Procedure: CONDYLOMA REMOVAL;  Surgeon: Mark Ottelin, MD;  Location: [facility];  Service: Urology;  Laterality: N/A;   HEMORRHOID SURGERY  10/24/2014   Procedure: HEMORRHOIDECTOMY;  Surgeon: Elspeth Schultze, MD;  Location: [facility];  Service: General;;   INCISION AND DRAINAGE ABSCESS Left 10/24/2014   Procedure: INCISION AND DRAINAGE ABSCESS;  Surgeon: Elspeth Schultze, MD;  Location: [facility];  Service: General;  Laterality: Left;   INGUINAL HIDRADENITIS EXCISION  1998, 1999   LASER ABLATION CONDOLAMATA N/A 03/20/2014   Procedure: EXAM UNDER ANESTHESIA, REMOVAL/ABLATION OF CONDYLOMATA PENIS,GROINS, ANUS, ANAL CANAL;  Surgeon: Elspeth KYM Schultze, MD;  Location: [facility];  Service: General;  Laterality: N/A;  groin and anus   LASER ABLATION CONDOLAMATA N/A 10/24/2014   Procedure: LASER ABLATION CONDOLAMATA;  Surgeon: Elspeth Schultze, MD;  Location: [facility];  Service: General;  Laterality: N/A;   LASER ABLATION OF PENILE AND PERIANAL WARTS  07-29-2007  Dr. Schultze   LEFT SHOULDER SURGERY  2003   LOWER EXTREMITY ANGIOGRAPHY N/A 01/01/2024   Procedure: Lower Extremity Angiography;  Surgeon: Sheree Penne Bruckner, MD;  Location: [facility] INVASIVE CV LAB;  Service: Vascular;  Laterality: N/A;   LOWER EXTREMITY ANGIOGRAPHY N/A 02/15/2024   Procedure: Lower Extremity Angiography;  Surgeon: Pearline Norman RAMAN, MD;  Location: [facility] INVASIVE CV LAB;  Service: Cardiovascular;  Laterality: N/A;   LOWER EXTREMITY INTERVENTION  01/01/2024   Procedure: LOWER EXTREMITY INTERVENTION;  Surgeon: Sheree Penne Bruckner, MD;  Location: [facility] INVASIVE CV LAB;  Service: Vascular;;   LOWER EXTREMITY INTERVENTION N/A  02/15/2024   Procedure: LOWER EXTREMITY INTERVENTION;  Surgeon: Pearline Norman RAMAN, MD;  Location: [facility] INVASIVE CV LAB;  Service: Cardiovascular;  Laterality: N/A;   MASS EXCISION N/A 10/24/2014   Procedure: EXCISION OF PERINEAL MASS/SINUS;  Surgeon: Elspeth Schultze, MD;  Location: [facility];  Service: General;  Laterality: N/A;   MOHS SURGERY     back   MOHS SURGERY  2017   face   MULTIPLE TOOTH EXTRACTIONS     PERINEAL HIDRADENITIS EXCISION  1998, 1999   TRANSURETHRAL RESECTION OF BLADDER TUMOR  05/21/2012   Procedure: TRANSURETHRAL RESECTION OF BLADDER TUMOR (TURBT);  Surgeon: Mark C Ottelin, MD;  Location: [facility];  Service: Urology;  Laterality: N/A;       Allergies  Allergen Reactions   Cefepime  Hives    Had allergic reaction to one of these 3 agents, unclear which one   Metoprolol  Hives  Had allergic reaction to one of these 3 agents, unclear which one  *Per RN, highly likely this is the cause of allergic rxn   Myrbetriq  [Mirabegron ] Hives    Had allergic reaction to one of these 3 agents, unclear which one    Prior to Admission medications   Medication Sig Start Date End Date Taking? Authorizing Provider  aspirin  EC 81 MG tablet Take 1 tablet (81 mg total) by mouth daily. Swallow whole. 02/17/24  Yes Baglia, Corrina, PA-C  atorvastatin  (LIPITOR ) 40 MG tablet TAKE 1 TABLET BY MOUTH ONCE DAILY 02/22/24  Yes Norleen Lynwood ORN, MD  citalopram  (CELEXA ) 20 MG tablet Take 1 tablet (20 mg total) by mouth daily. 02/22/24  Yes Norleen Lynwood ORN, MD  clopidogrel  (PLAVIX ) 75 MG tablet Take 1 tablet (75 mg total) by mouth daily. 02/22/24 02/21/25 Yes Norleen Lynwood ORN, MD  insulin  glargine, 2 Unit Dial , (TOUJEO  MAX SOLOSTAR) 300 UNIT/ML Solostar Pen Inject 35 Units into the skin every morning. 03/16/24  Yes Norleen Lynwood ORN, MD  irbesartan  (AVAPRO ) 300 MG tablet Take 1 tablet (300 mg total) by mouth daily. 02/22/24  Yes Norleen Lynwood ORN, MD  pantoprazole  (PROTONIX ) 40 MG tablet Take 1  tablet (40 mg total) by mouth daily. 02/22/24  Yes Norleen Lynwood ORN, MD  amoxicillin -clavulanate (AUGMENTIN ) 875-125 MG tablet Take 1 tablet by mouth 2 (two) times daily. Patient not taking: Reported on 03/28/2024 03/08/24   Standiford, Marsa FALCON, DPM  loratadine  (CLARITIN ) 10 MG tablet Take 10 mg by mouth daily as needed.    [provider]    Social History   Socioeconomic History   Marital status: Widowed    Spouse name: Not on file   Number of children: 0   Years of education: Not on file   Highest education level: Not on file  Occupational History   Occupation: Disabled/retired  Tobacco Use   Smoking status: Every Day    Current packs/day: 0.00    Average packs/day: 1 pack/day for 41.0 years (41.0 ttl pk-yrs)    Types: Cigars, Cigarettes    Start date: 22    Last attempt to quit: 2017    Years since quitting: 8.6   Smokeless tobacco: Never   Tobacco comments:    Quit cigarettes in 2017 but started back smoking Cigars  Vaping Use   Vaping status: Former  Substance and Sexual Activity   Alcohol use: No    Alcohol/week: 0.0 standard drinks of alcohol   Drug use: No   Sexual activity: Not Currently  Other Topics Concern   Not on file  Social History Narrative   Not on file   Social Drivers of Health   Financial Resource Strain: High Risk (03/11/2024)   Overall Financial Resource Strain (CARDIA)    Difficulty of Paying Living Expenses: Very hard  Food Insecurity: Food Insecurity Present (03/11/2024)   Hunger Vital Sign    Worried About Running Out of Food in the Last Year: Often true    Ran Out of Food in the Last Year: Often true  Transportation Needs: Unmet Transportation Needs (03/11/2024)   PRAPARE - Administrator, Civil Service (Medical): Yes    Lack of Transportation (Non-Medical): Yes  Physical Activity: Inactive (06/10/2023)   Exercise Vital Sign    Days of Exercise per Week: 0 days    Minutes of Exercise per Session: 0 min  Stress: Stress  Concern Present (03/11/2024)   [employer] of Occupational Health - Occupational Stress Questionnaire  Feeling of Stress: Very much  Social Connections: Socially Isolated (03/09/2024)   Social Connection and Isolation Panel    Frequency of Communication with Friends and Family: More than three times a week    Frequency of Social Gatherings with Friends and Family: Once a week    Attends Religious Services: Never    Database administrator or Organizations: No    Attends Banker Meetings: Never    Marital Status: Widowed  Intimate Partner Violence: Not At Risk (03/11/2024)   Humiliation, Afraid, Rape, and Kick questionnaire    Fear of Current or Ex-Partner: No    Emotionally Abused: No    Physically Abused: No    Sexually Abused: No     Family History  Problem Relation Age of Onset   Hypertension Mother    Lung cancer Father    Diabetes Maternal Aunt        x 2   Colon cancer Neg Hx    Esophageal cancer Neg Hx    Pancreatic cancer Neg Hx    Prostate cancer Neg Hx    Kidney disease Neg Hx    Liver disease Neg Hx    Rectal cancer Neg Hx    Stomach cancer Neg Hx     ROS: Otherwise negative unless mentioned in HPI  Physical Examination  Vitals:   03/27/24 2301 03/28/24 0720  BP: (!) 151/59   Pulse: 71   Resp: 18   Temp: (!) 97.5 F (36.4 C) 98.2 F (36.8 C)  SpO2: 100%    Body mass index is 23.13 kg/m.  General:  WDWN in NAD Gait: Not observed HENT: WNL, normocephalic Pulmonary: normal non-labored breathing, without Rales, rhonchi,  wheezing Cardiac: regular Abdomen:  soft, NT/ND, no masses Skin: without rashes Vascular Exam/Pulses: 2+ left DP  Plantar ulceration-diabetic foot wound.  Amputation site healing.  Feet are dirty, poor hygiene Extremities: without ischemic changes Musculoskeletal: no muscle wasting or atrophy  Neurologic: A&O X 3;  No focal weakness or paresthesias are detected; speech is fluent/normal Psychiatric:  The pt has  Normal affect. Lymph:  Unremarkable  CBC    Component Value Date/Time   WBC 10.3 03/28/2024 0620   RBC 4.09 (L) 03/28/2024 0620   HGB 13.2 03/28/2024 0620   HCT 37.9 (L) 03/28/2024 0620   PLT 200 03/28/2024 0620   MCV 92.7 03/28/2024 0620   MCH 32.3 03/28/2024 0620   MCHC 34.8 03/28/2024 0620   RDW 13.0 03/28/2024 0620   LYMPHSABS 2.2 03/28/2024 0620   MONOABS 0.7 03/28/2024 0620   EOSABS 0.5 03/28/2024 0620   BASOSABS 0.1 03/28/2024 0620    BMET    Component Value Date/Time   NA 137 03/28/2024 0620   NA 138 06/16/2019 1144   K 3.3 (L) 03/28/2024 0620   CL 107 03/28/2024 0620   CO2 27 03/28/2024 0620   GLUCOSE 135 (H) 03/28/2024 0620   BUN 9 03/28/2024 0620   BUN 15 06/16/2019 1144   CREATININE 0.68 03/28/2024 0620   CALCIUM  8.6 (L) 03/28/2024 0620   GFRNONAA >60 03/28/2024 0620   GFRAA >60 02/29/2020 0937    COAGS: Lab Results  Component Value Date   INR 1.0 03/27/2024   INR 1.1 06/04/2019     ASSESSMENT/PLAN: This is a 68 y.o. male with history of left-sided critical limb ischemia with tissue loss now status post first toe amputation.  Unfortunately, he has had housing issues as well as hygiene concerns at home.  He just recently  got electricity and running water back.  There is some concern he was using Rainwater to clean his wounds.  On evaluation the patient has a readily palpable dorsalis pedis pulse in the foot.   I have ordered an ABI and left lower extremity duplex ultrasound to ensure there are no issues with the stent, however on physical exam I do not think this is the case.  I will follow-up imaging, however at this point, Khiem has been maximally revascularized. Wound degradation is multifactorial due to hygiene, hyperglycemia, housing insecurity, small vessel disease from diabetes.    Fonda FORBES Rim MD MS [facility] [phone] 03/28/2024  8:23 AM

## 2024-03-28 NOTE — H&P (Addendum)
 History and Physical    [name] FMW:[id number] DOB: [birth year] DOA: 03/27/2024  Patient coming from: Home.  Chief Complaint: Creasing swelling and discharge on the left foot.  HPI: [name] is a 68 y.o. male with history of peripheral artery disease status post recent left lower extremity SFA above-knee popliteal artery stenting with prior history of left small toe amputation for osteomyelitis in May 2025 and also recent admission for gangrene/osteomyelitis distal tip of the left great toe status post amputation presents to the ER with worsening left foot swelling and increasing discharge from the ulcer on the plantar aspect of the foot.  Patient has recently followed up with podiatrist Dr. Malvin on March 22, 2024.  Patient was noticed to have ulcer on the plantar aspect of the left foot overlying the first metatarsal phalangeal joint which has worsened from prior.  Patient also noticed increased discharge.  There was minimal bleeding on debridement concerning for ischemia.  Patient denies any fever or chills.  Patient presents because of a worsening swelling and increased discharge from the ulcer on the plantar aspect of the foot overlying the first metatarsal phalangeal joint.  ED Course: X-rays do not show anything acute.  Patient's pulses are dopplerable on the dorsalis aspect.  Labs show blood glucose of 309 anion gap of 9.  WBC 10.6.  Blood cultures obtained started on empiric antibiotics admitted for further workup.  Review of Systems: As per HPI, rest all negative.   Past Medical History:  Diagnosis Date   Adenomatous colon polyp    Allergic rhinitis    At risk for sleep apnea    STOP-BANG= 5   SENT TO PCP 03-14-2014   Bladder cancer (HCC)    CAD (coronary artery disease)    a. 07/2014 low risk MV; b. 07/2019 Cor CTA (FFR): LM 25-49 (nl), LAD mild prox/mid plaque (nl), D1 25-49p(nl w/ abnl FFR of 0.73 in inf branch), LCX/OM1 mild prox/mid plaque (nl), RCA nondominant,  minimal Ca2+ plaque (nl), RPDA (mildly abnl @ 0.79)-->Med Rx..   Cataract    surgically removed bilateral   Condyloma acuminatum of penis    Diabetic neuropathy (HCC)    Diastolic dysfunction    a. 05/2019 Echo: EF 60-65%, no rwam, mod LVH, impaired relaxation, nl RV size/fxn, trace MR, triv TR.   Diverticulosis    GERD (gastroesophageal reflux disease)    History of bladder cancer    s/p  turbt  2013/   transitional cell carcinoma--    History of condyloma acuminatum    PERINEAL AREA  W/ RECURRENCY   History of gout    Hyperlipidemia    Hypertension    Hypoxemia 03/09/2024   Lower urinary tract symptoms (LUTS)    PAF (paroxysmal atrial fibrillation) (HCC)    a. 06/2019 Event monitor: PAF <1% burden. Longest 3 mins 36 secs.   Productive cough    PSVT (paroxysmal supraventricular tachycardia) (HCC)    a. 06/2019 Event monitor: 112 episodes of SVT, longest 21 secs.   PVD (peripheral vascular disease) with claudication (HCC)    a. 03/2014 LE art duplex: long segment occlusion of mid to distal R SFA; b. 03/2020 ABI: nl left and mildly improved R ABI->med rx.   Renal artery stenosis (HCC)    Renal artery stenosis (HCC)    a. 12/2020 Renal art duplex: RRA 1-59%, LRA >60%.   Smokers' cough (HCC)    Type 2 diabetes mellitus with insulin  therapy (HCC) 1992   monitor by  dr ellsion   Wears dentures    upper    Past Surgical History:  Procedure Laterality Date   ABDOMINAL AORTOGRAM W/LOWER EXTREMITY N/A 02/15/2024   Procedure: ABDOMINAL AORTOGRAM W/LOWER EXTREMITY;  Surgeon: Pearline Norman RAMAN, MD;  Location: [facility] INVASIVE CV LAB;  Service: Cardiovascular;  Laterality: N/A;   AMPUTATION Left 01/02/2024   Procedure: AMPUTATION, FOOT, RAY;  Surgeon: Malvin Marsa FALCON, DPM;  Location: [facility] OR;  Service: Orthopedics/Podiatry;  Laterality: Left;  FIFTH TOE   AMPUTATION TOE Left 02/29/2024   Procedure: AMPUTATION, LEFT GREAT TOE;  Surgeon: Malvin Marsa FALCON, DPM;  Location: [facility] OR;  Service:  Orthopedics/Podiatry;  Laterality: Left;  Left Great toe partial amputation,  Debridement Left Heel   AXILLARY HIDRADENITIS EXCISION  1997   CARDIOVASCULAR STRESS TEST  07-24-2014  dr darron grass   Low risk lexiscan  nuclear study with apical thinning and small inferolateral wall infarct at mid & basal level , no ischemia/  normal LVF and wall motion , ef 59%   CATARACT EXTRACTION Left    CATARACT EXTRACTION W/ INTRAOCULAR LENS IMPLANT Right    CO2 LASER APPLICATION N/A 03/20/2014   Procedure: CO2 LASER APPLICATION,PENIS, GROIN, ANUS;  Surgeon: Elspeth KYM Schultze, MD;  Location: [facility];  Service: General;  Laterality: N/A;   CO2 LASER APPLICATION N/A 05/21/2015   Procedure: CO2 LASER APPLICATION;  Surgeon: Mark Ottelin, MD;  Location: [facility];  Service: Urology;  Laterality: N/A;   CONDYLOMA EXCISION/FULGURATION N/A 05/21/2015   Procedure: CONDYLOMA REMOVAL;  Surgeon: Mark Ottelin, MD;  Location: [facility];  Service: Urology;  Laterality: N/A;   HEMORRHOID SURGERY  10/24/2014   Procedure: HEMORRHOIDECTOMY;  Surgeon: Elspeth Schultze, MD;  Location: [facility];  Service: General;;   INCISION AND DRAINAGE ABSCESS Left 10/24/2014   Procedure: INCISION AND DRAINAGE ABSCESS;  Surgeon: Elspeth Schultze, MD;  Location: [facility];  Service: General;  Laterality: Left;   INGUINAL HIDRADENITIS EXCISION  1998, 1999   LASER ABLATION CONDOLAMATA N/A 03/20/2014   Procedure: EXAM UNDER ANESTHESIA, REMOVAL/ABLATION OF CONDYLOMATA PENIS,GROINS, ANUS, ANAL CANAL;  Surgeon: Elspeth KYM Schultze, MD;  Location: [facility];  Service: General;  Laterality: N/A;  groin and anus   LASER ABLATION CONDOLAMATA N/A 10/24/2014   Procedure: LASER ABLATION CONDOLAMATA;  Surgeon: Elspeth Schultze, MD;  Location: [facility];  Service: General;  Laterality: N/A;   LASER ABLATION OF PENILE AND PERIANAL WARTS  07-29-2007  Dr. Schultze    LEFT SHOULDER SURGERY  2003   LOWER EXTREMITY ANGIOGRAPHY N/A 01/01/2024   Procedure: Lower Extremity Angiography;  Surgeon: Sheree Penne Bruckner, MD;  Location: [facility] INVASIVE CV LAB;  Service: Vascular;  Laterality: N/A;   LOWER EXTREMITY ANGIOGRAPHY N/A 02/15/2024   Procedure: Lower Extremity Angiography;  Surgeon: Pearline Norman RAMAN, MD;  Location: [facility] INVASIVE CV LAB;  Service: Cardiovascular;  Laterality: N/A;   LOWER EXTREMITY INTERVENTION  01/01/2024   Procedure: LOWER EXTREMITY INTERVENTION;  Surgeon: Sheree Penne Bruckner, MD;  Location: [facility] INVASIVE CV LAB;  Service: Vascular;;   LOWER EXTREMITY INTERVENTION N/A 02/15/2024   Procedure: LOWER EXTREMITY INTERVENTION;  Surgeon: Pearline Norman RAMAN, MD;  Location: [facility] INVASIVE CV LAB;  Service: Cardiovascular;  Laterality: N/A;   MASS EXCISION N/A 10/24/2014   Procedure: EXCISION OF PERINEAL MASS/SINUS;  Surgeon: Elspeth Schultze, MD;  Location: [facility];  Service: General;  Laterality: N/A;   MOHS SURGERY     back  MOHS SURGERY  2017   face   MULTIPLE TOOTH EXTRACTIONS     PERINEAL HIDRADENITIS EXCISION  1998, 1999   TRANSURETHRAL RESECTION OF BLADDER TUMOR  05/21/2012   Procedure: TRANSURETHRAL RESECTION OF BLADDER TUMOR (TURBT);  Surgeon: Mark C Ottelin, MD;  Location: [facility];  Service: Urology;  Laterality: N/A;        reports that he has been smoking cigars and cigarettes. He started smoking about 49 years ago. He has a 41 pack-year smoking history. He has never used smokeless tobacco. He reports that he does not drink alcohol and does not use drugs.  Allergies  Allergen Reactions   Cefepime  Hives    Had allergic reaction to one of these 3 agents, unclear which one   Metoprolol  Hives    Had allergic reaction to one of these 3 agents, unclear which one  *Per RN, highly likely this is the cause of allergic rxn   Myrbetriq  [Mirabegron ] Hives    Had allergic reaction to one of these 3 agents, unclear  which one    Family History  Problem Relation Age of Onset   Hypertension Mother    Lung cancer Father    Diabetes Maternal Aunt        x 2   Colon cancer Neg Hx    Esophageal cancer Neg Hx    Pancreatic cancer Neg Hx    Prostate cancer Neg Hx    Kidney disease Neg Hx    Liver disease Neg Hx    Rectal cancer Neg Hx    Stomach cancer Neg Hx     Prior to Admission medications   Medication Sig Start Date End Date Taking? Authorizing Provider  aspirin  EC 81 MG tablet Take 1 tablet (81 mg total) by mouth daily. Swallow whole. 02/17/24  Yes Baglia, Corrina, PA-C  atorvastatin  (LIPITOR ) 40 MG tablet TAKE 1 TABLET BY MOUTH ONCE DAILY 02/22/24  Yes Norleen Lynwood ORN, MD  citalopram  (CELEXA ) 20 MG tablet Take 1 tablet (20 mg total) by mouth daily. 02/22/24  Yes Norleen Lynwood ORN, MD  clopidogrel  (PLAVIX ) 75 MG tablet Take 1 tablet (75 mg total) by mouth daily. 02/22/24 02/21/25 Yes Norleen Lynwood ORN, MD  insulin  glargine, 2 Unit Dial , (TOUJEO  MAX SOLOSTAR) 300 UNIT/ML Solostar Pen Inject 35 Units into the skin every morning. 03/16/24  Yes Norleen Lynwood ORN, MD  irbesartan  (AVAPRO ) 300 MG tablet Take 1 tablet (300 mg total) by mouth daily. 02/22/24  Yes Norleen Lynwood ORN, MD  pantoprazole  (PROTONIX ) 40 MG tablet Take 1 tablet (40 mg total) by mouth daily. 02/22/24  Yes Norleen Lynwood ORN, MD  amoxicillin -clavulanate (AUGMENTIN ) 875-125 MG tablet Take 1 tablet by mouth 2 (two) times daily. Patient not taking: Reported on 03/28/2024 03/08/24   Standiford, Marsa FALCON, DPM  loratadine  (CLARITIN ) 10 MG tablet Take 10 mg by mouth daily as needed.    [provider]    Physical Exam: Constitutional: Moderately built and nourished. Vitals:   03/27/24 2301 03/27/24 2328  BP: (!) 151/59   Pulse: 71   Resp: 18   Temp: (!) 97.5 F (36.4 C)   TempSrc: Oral   SpO2: 100%   Weight:  69 kg  Height:  5' 8 (1.727 m)   Eyes: Anicteric no pallor. ENMT: No discharge from the ears eyes nose or mouth. Neck: No mass felt.  No  neck rigidity. Respiratory: No rhonchi or crepitations. Cardiovascular: S1-S2 heard. Abdomen: Soft nontender bowel sound present. Musculoskeletal: Left foot  is swollen and erythematous with ulcer on the plantar aspect overlying the first metatarsophalangeal joint measuring around 3 cm with some discharge.  There is also some ulcers on the heel.  There is necrosis on the suture site at the first toe amputated site. Skin: Ulcer as explained in the musculoskeletal system. Neurologic: Alert awake oriented to time place and person.  Moves all extremities. Psychiatric: Appears normal.  Normal affect.   Labs on Admission: I have personally reviewed following labs and imaging studies  CBC: Recent Labs  Lab 03/27/24 2336  WBC 10.6*  NEUTROABS 6.6  HGB 13.4  HCT 38.8*  MCV 94.9  PLT 220   Basic Metabolic Panel: Recent Labs  Lab 03/27/24 2336  NA 136  K 3.3*  CL 100  CO2 27  GLUCOSE 309*  BUN 9  CREATININE 0.82  CALCIUM  8.8*   GFR: Estimated Creatinine Clearance: 84.6 mL/min (by C-G formula based on SCr of 0.82 mg/dL). Liver Function Tests: Recent Labs  Lab 03/27/24 2336  AST 15  ALT 27  ALKPHOS 77  BILITOT 0.7  PROT 6.5  ALBUMIN 2.9*   No results for input(s): LIPASE, AMYLASE in the last 168 hours. No results for input(s): AMMONIA in the last 168 hours. Coagulation Profile: Recent Labs  Lab 03/27/24 2336  INR 1.0   Cardiac Enzymes: No results for input(s): CKTOTAL, CKMB, CKMBINDEX, TROPONINI in the last 168 hours. BNP (last 3 results) No results for input(s): PROBNP in the last 8760 hours. HbA1C: No results for input(s): HGBA1C in the last 72 hours. CBG: Recent Labs  Lab 03/27/24 2315  GLUCAP 302*   Lipid Profile: No results for input(s): CHOL, HDL, LDLCALC, TRIG, CHOLHDL, LDLDIRECT in the last 72 hours. Thyroid  Function Tests: No results for input(s): TSH, T4TOTAL, FREET4, T3FREE, THYROIDAB in the last 72  hours. Anemia Panel: No results for input(s): VITAMINB12, FOLATE, FERRITIN, TIBC, IRON, RETICCTPCT in the last 72 hours. Urine analysis:    Component Value Date/Time   COLORURINE YELLOW 03/27/2024 2358   APPEARANCEUR CLEAR 03/27/2024 2358   LABSPEC 1.010 03/27/2024 2358   PHURINE 5.0 03/27/2024 2358   GLUCOSEU >=500 (A) 03/27/2024 2358   GLUCOSEU >=1000 (A) 02/22/2024 1454   HGBUR NEGATIVE 03/27/2024 2358   BILIRUBINUR NEGATIVE 03/27/2024 2358   KETONESUR NEGATIVE 03/27/2024 2358   PROTEINUR 30 (A) 03/27/2024 2358   UROBILINOGEN 0.2 02/22/2024 1454   NITRITE NEGATIVE 03/27/2024 2358   LEUKOCYTESUR MODERATE (A) 03/27/2024 2358   Sepsis Labs: @LABRCNTIP (procalcitonin:4,lacticidven:4) )No results found for this or any previous visit (from the past 240 hours).   Radiological Exams on Admission: DG Foot Complete Left Result Date: 03/28/2024 CLINICAL DATA:  Multiple amputations.  Pain.  Ulcers. EXAM: LEFT FOOT - COMPLETE 3+ VIEW COMPARISON:  02/29/2024 FINDINGS: Prior great toe and 5th toe amputation. No acute bone destruction to suggest acute osteomyelitis. No acute bony abnormality. Specifically, no fracture, subluxation, or dislocation. No soft tissue gas. IMPRESSION: No acute bony abnormality. Electronically Signed   By: Franky Crease M.D.   On: 03/28/2024 01:56     Assessment/Plan Principal Problem:   Cellulitis of foot, left Active Problems:   Essential hypertension   Bladder cancer (HCC)   PAD (peripheral artery disease) (HCC)   Insulin  dependent type 2 diabetes mellitus (HCC)   Cellulitis    Cellulitis of the left foot with concern for possible osteomyelitis with diabetic foot ulceration of the plantar aspect of the left foot overlying the first metatarsal joint and also necrosis of the left  toe at the surgical site has ulcers around the heel.-   Will order MRI of the left foot.  Pulses are dopplerable.  Will check ABI. Empiric antibiotics.  Check sed rate and  CRP.  Discussed with Dr. Pierce podiatrist requested getting vascular surgery consult. Peripheral artery disease status post left SFA stenting on 01/01/2024 subsequently seen again by vascular surgeon and underwent repeat stenting and left SFA on 02/15/2024.  On antiplatelet agents statins.  Will check ABI.  Will consult, vascular surgery. Diabetes mellitus type 2 uncontrolled last hemoglobin A1c last month was 11.2 takes long-acting insulin  35 units at bedtime I have placed patient on moderate dose sliding scale coverage. Hypertension on ARB. History of bladder cancer status post TURBT. History of paroxysmal atrial fibrillation cardiology felt patient was not a candidate for anticoagulation secondary to low A-fib burden. GERD on PPI. Mood disorder on Celexa . Hyperlipidemia on statins.  Since patient has cellulitis of the left foot with diabetic foot ulcer and peripheral artery disease will need further workup and more than 2 midnight stay.   DVT prophylaxis: Avoiding anticoagulation in anticipation of procedure. Code Status: Full code. Family Communication: Discussed with patient. Disposition Plan: Medical floor. Consults called: Will consult podiatry. Admission status: Observation.

## 2024-03-28 NOTE — Progress Notes (Signed)
 VASCULAR LAB    ABI has been performed.  See CV proc for preliminary results.   Aarik Blank, RVT 03/28/2024, 11:43 AM

## 2024-03-28 NOTE — Progress Notes (Signed)
 ED Pharmacy Antibiotic Sign Off An antibiotic consult was received from an ED provider for vancomycin  per pharmacy dosing for wound infection. A chart review was completed to assess appropriateness.   The following one time order(s) were placed:   -Vancomycin  1750mg  IV x1  Further antibiotic and/or antibiotic pharmacy consults should be ordered by the admitting provider if indicated.   Thank you for allowing pharmacy to be a part of this patient's care.   Lynwood Poplar, [facility]  Clinical Pharmacist 03/28/24 2:11 AM

## 2024-03-28 NOTE — ED Notes (Signed)
 Pt back from MRI and in room. Pt put on cardiac monitor and pulse ox monitor.

## 2024-03-28 NOTE — ED Notes (Signed)
 CCMD called.

## 2024-03-28 NOTE — ED Notes (Signed)
 Pt given graham crackers, peanut butter, and applesauce with insulin .

## 2024-03-28 NOTE — Progress Notes (Signed)
 PROGRESS NOTE    [name]  FMW:[id number] DOB: March 23, 1956 DOA: 03/27/2024 PCP: Norleen Lynwood LELON, MD   Brief Narrative: [name] is a 68 y.o. male with a history of PAD, osteomyelitis s/p toe amputation, diabetes mellitus, hypertension, bladder cancer, paroxysmal atrial fibrillation, GERD, depression.  Patient presented secondary to increasing foot swelling and discharge with evidence of cellulitis and infected foot wound. Blood cultures obtained, empiric antibiotics obtained and podiatry consulted for management.   Assessment/Plan:  Cellulitis of left foot Diabetic foot wound Blood cultures obtained on admission. MRI of left foot significant for no evidence of osteomyelitis but does support cellulitis. Vascular surgery and podiatry consulted. -Continue Vancomycin  and Unasyn  -Podiatry recommendations: plan for surgery on 8/19 -Vascular surgery recommendations: ABI and LE duplex ultrasound to ensure no issues with stent; patient has been maximally revascularized  Peripheral artery disease Patient is s/p left SFA stenting recently. Patient is on Plavix  as an outpatient, which is held secondary to upcoming surgery. -Resume Plavix  post-operatively per podiatry recommendations  Diabetes mellitus type 2 Uncontrolled with hyperglycemia with A1C of 11.2%  Primary hypertension -Continue irbesartan   History of bladder cancer Noted.  Hypokalemia Potassium of 3.3. -Potassium supplementation  History of paroxysmal atrial fibrillation Not on rate control, rhythm control or anticoagulation.  GERD -Continue Protonix   Depression -Continue Celexa   Hyperlipidemia -Continue Lipitor    DVT prophylaxis: SCDs Code Status:   Code Status: Full Code Family Communication: None at bedside Disposition Plan: Discharge pending ongoing specialist recommendations and transition to outpatient antibiotics as needed   Consultants:  Vascular surgery Podiatry  Procedures:   None  Antimicrobials: Vancomycin  Unasyn     Subjective: Patient reports no issues this morning. He explains many issues regarding his home environment. Most significantly, he does not have access to electricity and therefor has unreliable storage for insulin   Objective: BP (!) 151/59 (BP Location: Right Arm)   Pulse 71   Temp (!) 97.5 F (36.4 C) (Oral)   Resp 18   Ht 5' 8 (1.727 m)   Wt 69 kg   SpO2 100%   BMI 23.13 kg/m   Examination:  General exam: Appears calm and comfortable Respiratory system: Clear to auscultation. Respiratory effort normal. Cardiovascular system: S1 & S2 heard, RRR. No murmurs, rubs, gallops or clicks. Gastrointestinal system: Abdomen is nondistended, soft and nontender. Normal bowel sounds heard. Central nervous system: Alert and oriented. No focal neurological deficits. Musculoskeletal: No edema. No calf tenderness Psychiatry: Judgement and insight appear normal. Mood & affect appropriate.    Data Reviewed: I have personally reviewed following labs and imaging studies   Last CBC Lab Results  Component Value Date   WBC 10.6 (H) 03/27/2024   HGB 13.4 03/27/2024   HCT 38.8 (L) 03/27/2024   MCV 94.9 03/27/2024   MCH 32.8 03/27/2024   RDW 12.8 03/27/2024   PLT 220 03/27/2024     Last metabolic panel Lab Results  Component Value Date   GLUCOSE 309 (H) 03/27/2024   NA 136 03/27/2024   K 3.3 (L) 03/27/2024   CL 100 03/27/2024   CO2 27 03/27/2024   BUN 9 03/27/2024   CREATININE 0.82 03/27/2024   GFRNONAA >60 03/27/2024   CALCIUM  8.8 (L) 03/27/2024   PHOS 3.2 02/29/2024   PROT 6.5 03/27/2024   ALBUMIN 2.9 (L) 03/27/2024   LABGLOB 3.8 06/16/2019   AGRATIO 1.1 (L) 06/16/2019   BILITOT 0.7 03/27/2024   ALKPHOS 77 03/27/2024   AST 15 03/27/2024   ALT 27 03/27/2024  ANIONGAP 9 03/27/2024     Creatinine Clearance: Estimated Creatinine Clearance: 84.6 mL/min (by C-G formula based on SCr of 0.82 mg/dL).  No results found for this  or any previous visit (from the past 240 hours).    Radiology Studies: DG Foot Complete Left Result Date: 03/28/2024 CLINICAL DATA:  Multiple amputations.  Pain.  Ulcers. EXAM: LEFT FOOT - COMPLETE 3+ VIEW COMPARISON:  02/29/2024 FINDINGS: Prior great toe and 5th toe amputation. No acute bone destruction to suggest acute osteomyelitis. No acute bony abnormality. Specifically, no fracture, subluxation, or dislocation. No soft tissue gas. IMPRESSION: No acute bony abnormality. Electronically Signed   By: Franky Crease M.D.   On: 03/28/2024 01:56      LOS: 0 days    Elgin Lam, MD [facility] 03/28/2024, 6:36 AM   If 7PM-7AM, please contact night-coverage [email or web address]

## 2024-03-28 NOTE — Progress Notes (Addendum)
 Transition of Care [facility]) - Inpatient Brief Assessment   Patient Details  Name: [name] MRN: [id number] Date of Birth: Feb 11, 1956  Transition of Care [facility]) CM/SW Contact:    Rosaline JONELLE Joe, RN Phone Number: 03/28/2024, 3:17 PM   Clinical Narrative: Patient admitted with drainage and signs of infection in Left great toe.  Patient was seen by Podiatry for planned surgery tomorrow.  Patient has long history of having no electricity at his home that [facility] Team is unable to resolve.  See prior admission notes that patient has had prior bills with [employer] and no power since March 2025.  Patient discharged home last admission to stay with a friend at his home after discharge.  IP Care management team will continue to follow the patient as his progresses post-surgery.   Transition of Care Asessment: Insurance and Status: (P) Insurance coverage has been reviewed Patient has primary care physician: (P) Yes Home environment has been reviewed: (P) from home/[place] with no electricity - patient has available friend to assist Prior level of function:: (P) Independent Prior/Current Home Services: (P) No current home services Social Drivers of Health Review: (P) SDOH reviewed interventions complete Readmission risk has been reviewed: (P) Yes Transition of care needs: (P) no transition of care needs at this time

## 2024-03-29 ENCOUNTER — Encounter (HOSPITAL_COMMUNITY)
Admission: EM | Disposition: A | Discharge status: Home / Self Care | Payer: Self-pay | Source: Home / Self Care | Attending: Family Medicine

## 2024-03-29 ENCOUNTER — Inpatient Hospital Stay (HOSPITAL_COMMUNITY)

## 2024-03-29 ENCOUNTER — Encounter: Payer: Self-pay | Admitting: *Deleted

## 2024-03-29 ENCOUNTER — Inpatient Hospital Stay (HOSPITAL_COMMUNITY): Admitting: Anesthesiology

## 2024-03-29 ENCOUNTER — Encounter (HOSPITAL_COMMUNITY)
Admission: EM | Disposition: A | Discharge status: Home / Self Care | Payer: Self-pay | Source: Home / Self Care | Attending: Internal Medicine

## 2024-03-29 ENCOUNTER — Encounter (HOSPITAL_COMMUNITY): Payer: Self-pay | Admitting: Internal Medicine

## 2024-03-29 DIAGNOSIS — F17210 Nicotine dependence, cigarettes, uncomplicated: Secondary | ICD-10-CM | POA: Diagnosis not present

## 2024-03-29 DIAGNOSIS — L97523 Non-pressure chronic ulcer of other part of left foot with necrosis of muscle: Secondary | ICD-10-CM | POA: Diagnosis not present

## 2024-03-29 DIAGNOSIS — I10 Essential (primary) hypertension: Secondary | ICD-10-CM

## 2024-03-29 DIAGNOSIS — Z89422 Acquired absence of other left toe(s): Secondary | ICD-10-CM | POA: Diagnosis not present

## 2024-03-29 DIAGNOSIS — Z794 Long term (current) use of insulin: Secondary | ICD-10-CM | POA: Diagnosis not present

## 2024-03-29 DIAGNOSIS — M869 Osteomyelitis, unspecified: Secondary | ICD-10-CM | POA: Diagnosis not present

## 2024-03-29 DIAGNOSIS — I96 Gangrene, not elsewhere classified: Secondary | ICD-10-CM | POA: Diagnosis not present

## 2024-03-29 DIAGNOSIS — E1152 Type 2 diabetes mellitus with diabetic peripheral angiopathy with gangrene: Secondary | ICD-10-CM

## 2024-03-29 HISTORY — PX: INCISION AND DRAINAGE OF WOUND: SHX1803

## 2024-03-29 HISTORY — PX: AMPUTATION TOE: SHX6595

## 2024-03-29 LAB — CBC
HCT: 36.6 % — ABNORMAL LOW (ref 39.0–52.0)
Hemoglobin: 12.7 g/dL — ABNORMAL LOW (ref 13.0–17.0)
MCH: 32.1 pg (ref 26.0–34.0)
MCHC: 34.7 g/dL (ref 30.0–36.0)
MCV: 92.4 fL (ref 80.0–100.0)
Platelets: 196 K/uL (ref 150–400)
RBC: 3.96 MIL/uL — ABNORMAL LOW (ref 4.22–5.81)
RDW: 13.2 % (ref 11.5–15.5)
WBC: 8.6 K/uL (ref 4.0–10.5)
nRBC: 0 % (ref 0.0–0.2)

## 2024-03-29 LAB — BASIC METABOLIC PANEL WITH GFR
Anion gap: 9 (ref 5–15)
BUN: 8 mg/dL (ref 8–23)
CO2: 27 mmol/L (ref 22–32)
Calcium: 8.7 mg/dL — ABNORMAL LOW (ref 8.9–10.3)
Chloride: 107 mmol/L (ref 98–111)
Creatinine, Ser: 0.73 mg/dL (ref 0.61–1.24)
GFR, Estimated: >60 mL/min (ref >60)
Glucose, Bld: 91 mg/dL (ref 70–99)
Potassium: 3.6 mmol/L (ref 3.5–5.1)
Sodium: 143 mmol/L (ref 135–145)

## 2024-03-29 LAB — GLUCOSE, CAPILLARY
Glucose-Capillary: 60 mg/dL — ABNORMAL LOW (ref 70–99)
Glucose-Capillary: 101 mg/dL — ABNORMAL HIGH (ref 70–99)
Glucose-Capillary: 76 mg/dL (ref 70–99)
Glucose-Capillary: 167 mg/dL — ABNORMAL HIGH (ref 70–99)
Glucose-Capillary: 178 mg/dL — ABNORMAL HIGH (ref 70–99)
Glucose-Capillary: 278 mg/dL — ABNORMAL HIGH (ref 70–99)

## 2024-03-29 SURGERY — AMPUTATION, FOOT, TRANSMETATARSAL
Anesthesia: General | Site: Toe | Laterality: Left

## 2024-03-29 SURGERY — AMPUTATION, TOE
Anesthesia: Monitor Anesthesia Care | Site: Toe | Laterality: Left

## 2024-03-29 MED ORDER — ONDANSETRON HCL 4 MG/2ML IJ SOLN
INTRAMUSCULAR | Status: DC | PRN
  Administered 2024-03-29: 4 mg via INTRAVENOUS

## 2024-03-29 MED ORDER — DEXTROSE 50 % IV SOLN
12.5000 g | INTRAVENOUS | Status: AC
Start: 2024-03-29 — End: 2024-03-29
  Administered 2024-03-29: 12.5 g via INTRAVENOUS
  Filled 2024-03-29: qty 50

## 2024-03-29 MED ORDER — CHLORHEXIDINE GLUCONATE 0.12 % MT SOLN
OROMUCOSAL | Status: AC
  Administered 2024-03-29: 15 mL via OROMUCOSAL
  Filled 2024-03-29: qty 15

## 2024-03-29 MED ORDER — AMISULPRIDE (ANTIEMETIC) 5 MG/2ML IV SOLN: 10.0000 mg | Freq: Once | INTRAVENOUS | Status: DC | PRN

## 2024-03-29 MED ORDER — INSULIN ASPART 100 UNIT/ML IJ SOLN: 0.0000 [IU] | INTRAMUSCULAR | Status: DC | PRN

## 2024-03-29 MED ORDER — BUPIVACAINE HCL (PF) 0.5 % IJ SOLN
INTRAMUSCULAR | Status: DC | PRN
  Administered 2024-03-29: 5 mL

## 2024-03-29 MED ORDER — LIDOCAINE HCL (PF) 1 % IJ SOLN
INTRAMUSCULAR | Status: DC | PRN
  Administered 2024-03-29: 5 mL

## 2024-03-29 MED ORDER — ONDANSETRON HCL 4 MG/2ML IJ SOLN
INTRAMUSCULAR | Status: AC
  Filled 2024-03-29: qty 4

## 2024-03-29 MED ORDER — ONDANSETRON HCL 4 MG/2ML IJ SOLN: 4.0000 mg | Freq: Once | INTRAMUSCULAR | Status: DC | PRN

## 2024-03-29 MED ORDER — LIDOCAINE HCL 1 % IJ SOLN
INTRAMUSCULAR | Status: AC
Start: 2024-03-29 — End: 2024-03-29
  Filled 2024-03-29: qty 20

## 2024-03-29 MED ORDER — ACETAMINOPHEN 500 MG PO TABS
ORAL_TABLET | ORAL | Status: AC
  Administered 2024-03-29: 1000 mg via ORAL
  Filled 2024-03-29: qty 2

## 2024-03-29 MED ORDER — ACETAMINOPHEN 500 MG PO TABS: 1000.0000 mg | ORAL_TABLET | Freq: Once | ORAL | Status: AC

## 2024-03-29 MED ORDER — SUCCINYLCHOLINE CHLORIDE 200 MG/10ML IV SOSY
PREFILLED_SYRINGE | INTRAVENOUS | Status: AC
  Filled 2024-03-29: qty 10

## 2024-03-29 MED ORDER — CHLORHEXIDINE GLUCONATE 0.12 % MT SOLN: 15.0000 mL | Freq: Once | OROMUCOSAL | Status: AC

## 2024-03-29 MED ORDER — PROPOFOL 10 MG/ML IV BOLUS
INTRAVENOUS | Status: DC | PRN
  Administered 2024-03-29: 30 mg via INTRAVENOUS

## 2024-03-29 MED ORDER — FENTANYL CITRATE (PF) 250 MCG/5ML IJ SOLN
INTRAMUSCULAR | Status: AC
  Filled 2024-03-29: qty 5

## 2024-03-29 MED ORDER — ROCURONIUM BROMIDE 10 MG/ML (PF) SYRINGE
PREFILLED_SYRINGE | INTRAVENOUS | Status: AC
  Filled 2024-03-29: qty 10

## 2024-03-29 MED ORDER — BUPIVACAINE HCL (PF) 0.5 % IJ SOLN
INTRAMUSCULAR | Status: AC
Start: 2024-03-29 — End: 2024-03-29
  Filled 2024-03-29: qty 30

## 2024-03-29 MED ORDER — LIDOCAINE 2% (20 MG/ML) 5 ML SYRINGE
INTRAMUSCULAR | Status: AC
  Filled 2024-03-29: qty 5

## 2024-03-29 MED ORDER — OXYCODONE-ACETAMINOPHEN 5-325 MG PO TABS
1.0000 | ORAL_TABLET | ORAL | Status: DC | PRN
  Administered 2024-03-29 – 2024-04-01 (×5): 1 via ORAL
  Filled 2024-03-29 (×5): qty 1

## 2024-03-29 MED ORDER — MIDAZOLAM HCL 2 MG/2ML IJ SOLN
INTRAMUSCULAR | Status: DC | PRN
  Administered 2024-03-29: 2 mg via INTRAVENOUS

## 2024-03-29 MED ORDER — PROPOFOL 500 MG/50ML IV EMUL
INTRAVENOUS | Status: DC | PRN
  Administered 2024-03-29: 85 ug/kg/min via INTRAVENOUS

## 2024-03-29 MED ORDER — OXYCODONE HCL 5 MG/5ML PO SOLN: 5.0000 mg | Freq: Once | ORAL | Status: DC | PRN

## 2024-03-29 MED ORDER — HYDROMORPHONE HCL 1 MG/ML IJ SOLN: 0.2500 mg | INTRAMUSCULAR | Status: DC | PRN

## 2024-03-29 MED ORDER — DEXAMETHASONE SODIUM PHOSPHATE 10 MG/ML IJ SOLN
INTRAMUSCULAR | Status: AC
  Filled 2024-03-29: qty 1

## 2024-03-29 MED ORDER — MIDAZOLAM HCL 2 MG/2ML IJ SOLN
INTRAMUSCULAR | Status: AC
  Filled 2024-03-29: qty 2

## 2024-03-29 MED ORDER — 0.9 % SODIUM CHLORIDE (POUR BTL) OPTIME
TOPICAL | Status: DC | PRN
  Administered 2024-03-29: 1000 mL

## 2024-03-29 MED ORDER — OXYCODONE HCL 5 MG PO TABS: 5.0000 mg | ORAL_TABLET | Freq: Once | ORAL | Status: DC | PRN

## 2024-03-29 MED ORDER — ORAL CARE MOUTH RINSE: 15.0000 mL | Freq: Once | OROMUCOSAL | Status: AC

## 2024-03-29 MED ORDER — FENTANYL CITRATE (PF) 250 MCG/5ML IJ SOLN
INTRAMUSCULAR | Status: DC | PRN
  Administered 2024-03-29: 25 ug via INTRAVENOUS

## 2024-03-29 MED ORDER — LACTATED RINGERS IV SOLN: INTRAVENOUS | Status: DC

## 2024-03-29 SURGICAL SUPPLY — 65 items
BLADE AVERAGE 25X9 (BLADE) IMPLANT
BLADE SURG 10 STRL SS (BLADE) ×2 IMPLANT
BLADE SURG 15 STRL LF DISP TIS (BLADE) ×2 IMPLANT
BNDG COHESIVE 3X5 TAN ST LF (GAUZE/BANDAGES/DRESSINGS) ×2 IMPLANT
BNDG COMPR ESMARK 4X3 LF (GAUZE/BANDAGES/DRESSINGS) ×2 IMPLANT
BNDG ELASTIC 3INX 5YD STR LF (GAUZE/BANDAGES/DRESSINGS) ×2 IMPLANT
BNDG ELASTIC 4INX 5YD STR LF (GAUZE/BANDAGES/DRESSINGS) ×2 IMPLANT
BNDG ELASTIC 6X15 VLCR STRL LF (GAUZE/BANDAGES/DRESSINGS) IMPLANT
BNDG GAUZE DERMACEA FLUFF 4 (GAUZE/BANDAGES/DRESSINGS) ×2 IMPLANT
CHLORAPREP W/TINT 26 (MISCELLANEOUS) ×2 IMPLANT
CNTNR URN SCR LID CUP LEK RST (MISCELLANEOUS) ×2 IMPLANT
COVER BACK TABLE 60X90IN (DRAPES) ×2 IMPLANT
CUFF TOURN SGL QUICK 18X4 (TOURNIQUET CUFF) ×2 IMPLANT
CUFF TRNQT CYL 24X4X16.5-23 (TOURNIQUET CUFF) ×2 IMPLANT
DRAPE 3/4 80X56 (DRAPES) ×2 IMPLANT
DRAPE EXTREMITY T 121X128X90 (DISPOSABLE) ×2 IMPLANT
DRAPE SHEET LG 3/4 BI-LAMINATE (DRAPES) ×2 IMPLANT
DRAPE U-SHAPE 47X51 STRL (DRAPES) ×2 IMPLANT
DRSG ADAPTIC 3X8 NADH LF (GAUZE/BANDAGES/DRESSINGS) IMPLANT
DRSG XEROFORM 1X8 (GAUZE/BANDAGES/DRESSINGS) IMPLANT
ELECT PENCIL ROCKER SW 15FT (MISCELLANEOUS) IMPLANT
ELECT REM PT RETURN 15FT ADLT (MISCELLANEOUS) ×2 IMPLANT
ELECTRODE REM PT RTRN 9FT ADLT (ELECTROSURGICAL) ×2 IMPLANT
GAUZE PAD ABD 8X10 STRL (GAUZE/BANDAGES/DRESSINGS) IMPLANT
GAUZE SPONGE 2X2 STRL 8-PLY (GAUZE/BANDAGES/DRESSINGS) IMPLANT
GAUZE SPONGE 4X4 12PLY STRL (GAUZE/BANDAGES/DRESSINGS) ×2 IMPLANT
GAUZE STRETCH 2X75IN STRL (MISCELLANEOUS) ×2 IMPLANT
GAUZE XEROFORM 1X8 LF (GAUZE/BANDAGES/DRESSINGS) ×2 IMPLANT
GLOVE BIO SURGEON STRL SZ7.5 (GLOVE) ×2 IMPLANT
GLOVE BIOGEL PI IND STRL 7.5 (GLOVE) ×2 IMPLANT
GOWN STRL REUS W/ TWL LRG LVL3 (GOWN DISPOSABLE) ×4 IMPLANT
GOWN STRL REUS W/ TWL XL LVL3 (GOWN DISPOSABLE) ×2 IMPLANT
KIT BASIN OR (CUSTOM PROCEDURE TRAY) ×2 IMPLANT
KIT TURNOVER KIT B (KITS) IMPLANT
MANIFOLD NEPTUNE II (INSTRUMENTS) ×2 IMPLANT
NDL HYPO 25X1 1.5 SAFETY (NEEDLE) ×2 IMPLANT
NEEDLE HYPO 25X1 1.5 SAFETY (NEEDLE) ×2 IMPLANT
NS IRRIG 1000ML POUR BTL (IV SOLUTION) ×2 IMPLANT
PACK ORTHO EXTREMITY (CUSTOM PROCEDURE TRAY) ×2 IMPLANT
PADDING CAST ABS COTTON 4X4 ST (CAST SUPPLIES) ×4 IMPLANT
PENCIL SMOKE EVACUATOR (MISCELLANEOUS) IMPLANT
SET HNDPC FAN SPRY TIP SCT (DISPOSABLE) IMPLANT
SET IRRIG Y TYPE TUR BLADDER L (SET/KITS/TRAYS/PACK) ×2 IMPLANT
SPIKE FLUID TRANSFER (MISCELLANEOUS) IMPLANT
SPONGE T-LAP 4X18 ~~LOC~~+RFID (SPONGE) ×2 IMPLANT
STAPLER SKIN PROX WIDE 3.9 (STAPLE) ×2 IMPLANT
STOCKINETTE 4X48 STRL (DRAPES) IMPLANT
STOCKINETTE 6 STRL (DRAPES) ×2 IMPLANT
SUCTION TUBE FRAZIER 10FR DISP (SUCTIONS) ×2 IMPLANT
SUT ETHILON 3 0 FSLX (SUTURE) IMPLANT
SUT ETHILON 3 0 PS 1 (SUTURE) ×2 IMPLANT
SUT ETHILON 4 0 PS 2 18 (SUTURE) ×2 IMPLANT
SUT MNCRL AB 3-0 PS2 18 (SUTURE) ×2 IMPLANT
SUT MNCRL AB 4-0 PS2 18 (SUTURE) ×2 IMPLANT
SUT PROLENE 3 0 PS 2 (SUTURE) IMPLANT
SUT PROLENE 4 0 PS 2 18 (SUTURE) IMPLANT
SUT VIC AB 2-0 SH 27XBRD (SUTURE) ×2 IMPLANT
SYR BULB EAR ULCER 3OZ GRN STR (SYRINGE) ×2 IMPLANT
SYR CONTROL 10ML LL (SYRINGE) ×2 IMPLANT
TISSUE MATRIDERM XS 3.7X5.2X1 (Tissue) IMPLANT
TUBE CONNECTING 12X1/4 (SUCTIONS) ×2 IMPLANT
TUBE IRRIGATION SET MISONIX (TUBING) ×2 IMPLANT
UNDERPAD 30X36 HEAVY ABSORB (UNDERPADS AND DIAPERS) ×2 IMPLANT
WATER STERILE IRR 1000ML POUR (IV SOLUTION) ×2 IMPLANT
YANKAUER SUCT BULB TIP NO VENT (SUCTIONS) ×2 IMPLANT

## 2024-03-29 NOTE — Transfer of Care (Signed)
 Immediate Anesthesia Transfer of Care Note  Patient: [name]  Procedure(s) Performed: AMPUTATION, TOE (Left: Toe) IRRIGATION AND DEBRIDEMENT WOUND (Left)  Patient Location: PACU  Anesthesia Type:MAC  Level of Consciousness: awake and drowsy  Airway & Oxygen Therapy: Patient Spontanous Breathing and Patient connected to face mask oxygen  Post-op Assessment: Report given to RN and Post -op Vital signs reviewed and stable  Post vital signs: Reviewed and stable  Last Vitals:  Vitals Value Taken Time  BP 135/61 03/29/24 12:45  Temp    Pulse 59 03/29/24 12:47  Resp 14 03/29/24 12:47  SpO2 96 % 03/29/24 12:47  Vitals shown include unfiled device data.  Last Pain:  Vitals:   03/29/24 1118  TempSrc: Oral  PainSc:          Complications: No notable events documented.

## 2024-03-29 NOTE — Plan of Care (Signed)
  Problem: Education: Goal: Ability to describe self-care measures that may prevent or decrease complications (Diabetes Survival Skills Education) will improve Outcome: Progressing   Problem: Tissue Perfusion: Goal: Adequacy of tissue perfusion will improve Outcome: Progressing   Problem: Health Behavior/Discharge Planning: Goal: Ability to manage health-related needs will improve Outcome: Progressing   Problem: Activity: Goal: Risk for activity intolerance will decrease Outcome: Progressing

## 2024-03-29 NOTE — Inpatient Diabetes Management (Addendum)
 Inpatient Diabetes Program Recommendations  AACE/ADA: New Consensus Statement on Inpatient Glycemic Control (2015)  Target Ranges:  Prepandial:   less than 140 mg/dL      Peak postprandial:   less than 180 mg/dL (1-2 hours)      Critically ill patients:  140 - 180 mg/dL   Lab Results  Component Value Date   GLUCAP 101 (H) 03/29/2024   HGBA1C 11.2 (H) 02/22/2024    Review of Glycemic Control  Latest Reference Range & Units 03/28/24 21:34 03/29/24 09:10 03/29/24 10:13  Glucose-Capillary 70 - 99 mg/dL 88 60 (L) 898 (H)  (L): Data is abnormally low (H): Data is abnormally high  Diabetes history: DM2 Outpatient Diabetes medications: Toujeo  35 units QD Current orders for Inpatient glycemic control: Lantus  35 units at bedtime, Novolog  0-15 units TID  Inpatient Diabetes Program Recommendations:    Hypoglycemia this morning.  Please consider decreasing basal insulin :  Lantus  25 units QD  Thank you, Wyvonna Pinal, MSN, CDCES Diabetes Coordinator Inpatient Diabetes Program [phone] (team pager from 8a-5p)

## 2024-03-29 NOTE — Progress Notes (Signed)
 Pt returned from OR, VSS and denies any immediate needs. Dressing to left foot C/D/I. Will continue to monitor for any additional needs.

## 2024-03-29 NOTE — Progress Notes (Addendum)
 PROGRESS NOTE    [name]  FMW:[id number] DOB: 1956/05/06 DOA: 03/27/2024 PCP: Paul Lynwood LELON, MD   Brief Narrative: [name] is a 68 y.o. male with a history of PAD, osteomyelitis s/p toe amputation, diabetes mellitus, hypertension, bladder cancer, paroxysmal atrial fibrillation, GERD, depression.  Patient presented secondary to increasing foot swelling and discharge with evidence of cellulitis and infected foot wound. Blood cultures obtained, empiric antibiotics obtained and podiatry consulted for management.   Assessment/Plan:  Cellulitis of left foot Diabetic foot wound Blood cultures obtained on admission. MRI of left foot significant for no evidence of osteomyelitis but does support cellulitis. Vascular surgery and podiatry consulted. ABI significant for right moderate disease.  LE with abnormal left toe-brachial index as well. Left arterial duplex significant for moderate stenosis (50-74%) of the distal CFA, mild stenosis (30-49%) in the distal ATA and a patent SFA to above knee popliteal artery stent. -Continue Vancomycin  and Unasyn  -Podiatry recommendations: plan for surgery on 8/19 -Vascular surgery recommendations: pending today; patient has been maximally revascularized  Peripheral artery disease Patient is s/p left SFA stenting recently. Patient is on Plavix  as an outpatient, which is held secondary to upcoming surgery. -Resume Plavix  post-operatively per podiatry recommendations  Diabetes mellitus type 2 Uncontrolled with hyperglycemia with A1C of 11.2%. Management complicated by poor home situation as he does not have reliable refrigeration for his insulin . Outpatient social work is involved. -Continue Semglee  and SSI  Primary hypertension -Continue irbesartan   History of bladder cancer Noted.  Hypokalemia Potassium of 3.3. Normal after potassium supplementation.  History of paroxysmal atrial fibrillation Not on rate control, rhythm control or  anticoagulation.  GERD -Continue Protonix   Depression -Continue Celexa   Hyperlipidemia -Continue Lipitor    DVT prophylaxis: SCDs Code Status:   Code Status: Full Code Family Communication: None at bedside Disposition Plan: Discharge pending ongoing specialist recommendations and transition to outpatient antibiotics as needed   Consultants:  Vascular surgery Podiatry  Procedures:  None  Antimicrobials: Vancomycin  Unasyn     Subjective: No issues overnight. Hoping to be able to leave the hospital some time this week.  Objective: BP (!) 153/66 (BP Location: Right Arm)   Pulse (!) 51   Temp 97.6 F (36.4 C) (Oral)   Resp 16   Ht 5' 8 (1.727 m)   Wt 69 kg   SpO2 99%   BMI 23.13 kg/m   Examination:  General exam: Appears calm and comfortable Respiratory system: Clear to auscultation. Respiratory effort normal. Cardiovascular system: S1 & S2 heard, RRR. No murmurs. Gastrointestinal system: Abdomen is nondistended, soft and nontender. Normal bowel sounds heard. Central nervous system: Alert and oriented. No focal neurological deficits. Skin: left great toe stump without obvious erythema and no purulence eminating from wound at this time Psychiatry: Judgement and insight appear normal. Mood & affect appropriate.    Data Reviewed: I have personally reviewed following labs and imaging studies   Last CBC Lab Results  Component Value Date   WBC 8.6 03/29/2024   HGB 12.7 (L) 03/29/2024   HCT 36.6 (L) 03/29/2024   MCV 92.4 03/29/2024   MCH 32.1 03/29/2024   RDW 13.2 03/29/2024   PLT 196 03/29/2024     Last metabolic panel Lab Results  Component Value Date   GLUCOSE 91 03/29/2024   NA 143 03/29/2024   K 3.6 03/29/2024   CL 107 03/29/2024   CO2 27 03/29/2024   BUN 8 03/29/2024   CREATININE 0.73 03/29/2024   GFRNONAA >60 03/29/2024  CALCIUM  8.7 (L) 03/29/2024   PHOS 3.2 02/29/2024   PROT 5.7 (L) 03/28/2024   ALBUMIN 2.4 (L) 03/28/2024   LABGLOB 3.8  06/16/2019   AGRATIO 1.1 (L) 06/16/2019   BILITOT 1.2 03/28/2024   ALKPHOS 63 03/28/2024   AST 14 (L) 03/28/2024   ALT 21 03/28/2024   ANIONGAP 9 03/29/2024     Creatinine Clearance: Estimated Creatinine Clearance: 86.7 mL/min (by C-G formula based on SCr of 0.73 mg/dL).  Recent Results (from the past 240 hours)  Culture, blood (Routine x 2)     Status: None (Preliminary result)   Collection Time: 03/27/24 11:36 PM   Specimen: BLOOD  Result Value Ref Range Status   Specimen Description BLOOD BLOOD RIGHT ARM  Final   Special Requests   Final    BOTTLES DRAWN AEROBIC AND ANAEROBIC Blood Culture adequate volume   Culture   Final    NO GROWTH < 12 HOURS Performed at [facility], [address] [place], [place] [address]    Report Status PENDING  Incomplete  Culture, blood (Routine x 2)     Status: None (Preliminary result)   Collection Time: 03/27/24 11:36 PM   Specimen: BLOOD  Result Value Ref Range Status   Specimen Description BLOOD BLOOD LEFT ARM  Final   Special Requests   Final    BOTTLES DRAWN AEROBIC ONLY Blood Culture adequate volume   Culture   Final    NO GROWTH < 12 HOURS Performed at [facility], [address] [place], [place] [address]    Report Status PENDING  Incomplete      Radiology Studies: VAS US  LOWER EXTREMITY ARTERIAL DUPLEX Result Date: 03/28/2024 LOWER EXTREMITY ARTERIAL DUPLEX STUDY Patient Name:  [name]  Date of Exam:   03/28/2024 Medical Rec #: [id number]      Accession #:    [id number] Date of Birth: [birth year]      Patient Gender: M Patient Age:   71 years Exam Location:  [facility] Procedure:      VAS US  LOWER EXTREMITY ARTERIAL DUPLEX Referring Phys: [name] --------------------------------------------------------------------------------  Indications: Ulceration, gangrene, and peripheral artery disease. High Risk Factors: Hypertension, hyperlipidemia, Diabetes, current smoker,                    coronary artery  disease.  Vascular Interventions: Left SFA stent placed 01/01/24, stent occluded and                         realigned with a 6 mm X 150 mm Eluvia stent 02/15/24. Left                         partial great toe amputation and left 5th digit                         amputation. Current ABI:            R: 0.61 L: 1.12 Comparison Study: Prior Left LEA done 03/01/24 Performing Technologist: Alberta Lis RVS  Examination Guidelines: A complete evaluation includes B-mode imaging, spectral Doppler, color Doppler, and power Doppler as needed of all accessible portions of each vessel. Bilateral testing is considered an integral part of a complete examination. Limited examinations for reoccurring indications may be performed as noted.  +-----------+--------+-----+---------------+----------+--------+ LEFT       PSV cm/sRatioStenosis  Waveform  Comments +-----------+--------+-----+---------------+----------+--------+ CFA Prox   156                         biphasic           +-----------+--------+-----+---------------+----------+--------+ CFA Distal 257          50-74% stenosisbiphasic           +-----------+--------+-----+---------------+----------+--------+ DFA        92                          biphasic           +-----------+--------+-----+---------------+----------+--------+ SFA Prox   115                         biphasic           +-----------+--------+-----+---------------+----------+--------+ SFA Mid    129                         biphasic           +-----------+--------+-----+---------------+----------+--------+ SFA Distal 122                         biphasic           +-----------+--------+-----+---------------+----------+--------+ POP Prox   104                         biphasic           +-----------+--------+-----+---------------+----------+--------+ POP Distal 107                         biphasic            +-----------+--------+-----+---------------+----------+--------+ ATA Prox   114                         biphasic           +-----------+--------+-----+---------------+----------+--------+ ATA Mid    104                         biphasic           +-----------+--------+-----+---------------+----------+--------+ ATA Distal 159          30-49% stenosisbiphasic           +-----------+--------+-----+---------------+----------+--------+ PTA Prox   138                         biphasic           +-----------+--------+-----+---------------+----------+--------+ PTA Mid    122                         biphasic           +-----------+--------+-----+---------------+----------+--------+ PTA Distal 81                          biphasic           +-----------+--------+-----+---------------+----------+--------+ PERO Prox  43                          monophasic         +-----------+--------+-----+---------------+----------+--------+ PERO Mid   57  monophasic         +-----------+--------+-----+---------------+----------+--------+ PERO Distal50                          monophasic         +-----------+--------+-----+---------------+----------+--------+  LEFT Stent(s): +---------------------------+--------+--------+--------+--------+ SFA to Above Knee PoplitealPSV cm/sStenosisWaveformComments +---------------------------+--------+--------+--------+--------+ Prox to Stent              129             biphasic         +---------------------------+--------+--------+--------+--------+ Proximal Stent             139             biphasic         +---------------------------+--------+--------+--------+--------+ Mid Stent                  159             biphasic         +---------------------------+--------+--------+--------+--------+ Distal Stent               122             biphasic          +---------------------------+--------+--------+--------+--------+ Distal to Stent            100             biphasic         +---------------------------+--------+--------+--------+--------+    Summary: Left: 50-74% stenosis noted in the distal CFA and 30-49% stenosis noted in the distal ATA. The SFA to above knee popliteal artery stent is patent.  See table(s) above for measurements and observations. Electronically signed by Paul Rim on 03/28/2024 at 4:45:53 PM.    Final    VAS US  ABI WITH/WO TBI Result Date: 03/28/2024  LOWER EXTREMITY DOPPLER STUDY Patient Name:  KAYLAN FRIEDMANN  Date of Exam:   03/28/2024 Medical Rec #: [id number]      Accession #:    [id number] Date of Birth: Mar 23, 1956      Patient Gender: M Patient Age:   69 years Exam Location:  [facility] Procedure:      VAS US  ABI WITH/WO TBI Referring Phys: REDIA CLEAVER --------------------------------------------------------------------------------  Indications: Ulceration, gangrene, and peripheral artery disease. Partial left              great toe amputation 02/29/24 and left fifth toe amputation 01/02/24 High Risk Factors: Hypertension, hyperlipidemia, Diabetes, current smoker,                    coronary artery disease.  Vascular Interventions: Left SFA stent placed 01/01/24, stent occluded and was                         realigned with a 6 mm X 150 mm Eluvia stent 02/15/24 Comparison Study: Prior ABI done 03/01/2024 Performing Technologist: Rachel Pellet RVS  Examination Guidelines: A complete evaluation includes at minimum, Doppler waveform signals and systolic blood pressure reading at the level of bilateral brachial, anterior tibial, and posterior tibial arteries, when vessel segments are accessible. Bilateral testing is considered an integral part of a complete examination. Photoelectric Plethysmograph (PPG) waveforms and toe systolic pressure readings are included as required and additional duplex testing as needed. Limited  examinations for reoccurring indications may be performed as noted.  ABI Findings: +---------+------------------+-----+----------+--------+ Right    Rt Pressure (mmHg)IndexWaveform  Comment  +---------+------------------+-----+----------+--------+  Brachial 152                    triphasic          +---------+------------------+-----+----------+--------+ PTA      100               0.61 monophasic         +---------+------------------+-----+----------+--------+ DP       91                0.55 monophasic         +---------+------------------+-----+----------+--------+ Great Toe59                0.36 Abnormal           +---------+------------------+-----+----------+--------+ +---------+------------------+-----+---------+-------------------------------+ Left     Lt Pressure (mmHg)IndexWaveform Comment                         +---------+------------------+-----+---------+-------------------------------+ Brachial 164                    triphasic                                +---------+------------------+-----+---------+-------------------------------+ PTA      184               1.12 biphasic                                 +---------+------------------+-----+---------+-------------------------------+ DP       168               1.02 biphasic                                 +---------+------------------+-----+---------+-------------------------------+ Great Toe0                 0.00          great toe amp, 2nd toe assessed +---------+------------------+-----+---------+-------------------------------+ +-------+-----------+-----------------+------------+-------------+ ABI/TBIToday's ABIToday's TBI      Previous ABIPrevious TBI  +-------+-----------+-----------------+------------+-------------+ Right  0.61       0.36             0.55        dampened flow +-------+-----------+-----------------+------------+-------------+ Left   1.12       no flow 2nd  digit0.88        amputation    +-------+-----------+-----------------+------------+-------------+ Right ABIs and TBIs appear increased compared to prior study on 03/01/24. Left ABIs appear increased compared to prior study on 03/01/24.  Summary: Right: Resting right ankle-brachial index indicates moderate right lower extremity arterial disease. The right toe-brachial index is abnormal. Left: Resting left ankle-brachial index is within normal range. The left toe-brachial index is abnormal. *See table(s) above for measurements and observations.  Electronically signed by Paul Rim on 03/28/2024 at 4:45:16 PM.    Final    MR FOOT LEFT WO CONTRAST Result Date: 03/28/2024 CLINICAL DATA:  Osteomyelitis suspected. EXAM: MRI OF THE LEFT FOOT WITHOUT CONTRAST TECHNIQUE: Multiplanar, multisequence MR imaging of the left forefoot was performed. No intravenous contrast was administered. COMPARISON:  Radiographs 03/28/2024 and 02/29/2024.  MRI 12/30/2023. FINDINGS: Bones/Joint/Cartilage Stable postsurgical changes from previous amputation of the small toe and more recent amputation through the head of the 1st proximal phalanx. There is nonspecific mild irregularity along the amputation margin  of the 1st proximal phalanx with a small amount of adjacent fluid. No progressive cortical destruction identified. The 2nd, 3rd and 4th toes appear intact. Minimal T2 hyperintensity within the 5th metatarsal head without cortical destruction or T1 marrow signal abnormality. Bipartite tibial sesamoid of the 1st metatarsal with decreased T2 marrow signal compared with previous MRI. No significant forefoot joint effusions. The alignment is normal at the Lisfranc joint. Similar mild chronic degenerative changes involving the medial cuneiform. Ligaments Intact Lisfranc ligament. The collateral ligaments of the remaining metatarsophalangeal joints appear intact. Muscles and Tendons Generalized muscular edema without focal fluid collection  or significant tenosynovitis. 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 Soft tissues Compared with the previous MRI, interval increased diffuse subcutaneous edema. Apparent new skin ulceration in the medial plantar aspect of the forefoot with underlying ill-defined soft tissue fluid. No organized fluid collection, foreign body or definite soft tissue emphysema. IMPRESSION: 1. Interval increased diffuse subcutaneous edema with apparent new skin ulceration in the medial plantar aspect of the forefoot, suspicious for soft tissue infection. No organized fluid collection, foreign body or definite soft tissue emphysema. 2. Stable postsurgical changes from previous amputation of the small toe and more recent amputation through the head of the 1st proximal phalanx. There is nonspecific mild irregularity along the amputation margin of the 1st proximal phalanx with a small amount of adjacent fluid. No progressive cortical destruction identified to strongly suggest recurrent osteomyelitis. 3. Minimal T2 hyperintensity within the 5th metatarsal head without cortical destruction or T1 marrow signal abnormality, likely reactive. 4. 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 Electronically Signed   By: Elsie Perone M.D.   On: 03/28/2024 10:08   DG Foot Complete Left Result Date: 03/28/2024 CLINICAL DATA:  Multiple amputations.  Pain.  Ulcers. EXAM: LEFT FOOT - COMPLETE 3+ VIEW COMPARISON:  02/29/2024 FINDINGS: Prior great toe and 5th toe amputation. No acute bone destruction to suggest acute osteomyelitis. No acute bony abnormality. Specifically, no fracture, subluxation, or dislocation. No soft tissue gas. IMPRESSION: No acute bony abnormality. Electronically Signed   By: Franky Crease M.D.   On: 03/28/2024 01:56      LOS: 1 day    Elgin Lam, MD [facility] 03/29/2024, 8:37 AM   If 7PM-7AM, please contact  night-coverage [email or web address]

## 2024-03-29 NOTE — Progress Notes (Signed)
 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  The various methods of treatment have been discussed with the patient and family. After consideration of risks, benefits and other options for treatment, the patient has consented to   Procedure(s) with comments: AMPUTATION, TOE (Left) - LEFT GREAT TOE IRRIGATION AND DEBRIDEMENT WOUND (Left) - LEFT GREAT TOE as a surgical intervention.  The patient's history has been reviewed, patient examined, no change in status, stable for surgery.  I have reviewed the patient's chart and labs.  Questions were answered to the patient's satisfaction.     [name]

## 2024-03-29 NOTE — Anesthesia Postprocedure Evaluation (Signed)
 Anesthesia Post Note  Patient: [name]  Procedure(s) Performed: AMPUTATION LEFT GREAT TOE (Left: Toe) IRRIGATION AND DEBRIDEMENT PLANTAR FOOT WOUND (Left: Foot)     Patient location during evaluation: PACU Anesthesia Type: MAC Level of consciousness: awake and alert Pain management: pain level controlled Vital Signs Assessment: post-procedure vital signs reviewed and stable Respiratory status: spontaneous breathing, nonlabored ventilation, respiratory function stable and patient connected to nasal cannula oxygen Cardiovascular status: stable and blood pressure returned to baseline Postop Assessment: no apparent nausea or vomiting Anesthetic complications: no   No notable events documented.  Last Vitals:  Vitals:   03/29/24 1245 03/29/24 1300  BP:  (!) 156/64  Pulse:  (!) 54  Resp:  14  Temp: (!) 36.2 C 36.4 C  SpO2:  96%    Last Pain:  Vitals:   03/29/24 1245  TempSrc:   PainSc: 0-No pain                 Almarie CHRISTELLA Marchi

## 2024-03-29 NOTE — Anesthesia Preprocedure Evaluation (Addendum)
 Anesthesia Evaluation  Patient identified by MRN, date of birth, ID band Patient awake    Reviewed: Allergy & Precautions, NPO status , Patient's Chart, lab work & pertinent test results  Airway Mallampati: II  TM Distance: >3 FB Neck ROM: Full    Dental  (+) Edentulous Upper, Edentulous Lower   Pulmonary asthma , sleep apnea , Current Smoker and Patient abstained from smoking. 41 pack year history    Pulmonary exam normal breath sounds clear to auscultation       Cardiovascular hypertension (183/79 preop), + CAD and + Peripheral Vascular Disease  Normal cardiovascular exam+ dysrhythmias Atrial Fibrillation  Rhythm:Regular Rate:Normal  Echo 2020  1. Left ventricular ejection fraction, by visual estimation, is 60 to  65%. The left ventricle has normal function. Normal left ventricular size.  There is moderately increased left ventricular hypertrophy.   2. Elevated left ventricular end-diastolic pressure.   3. Left ventricular diastolic Doppler parameters are consistent with  impaired relaxation pattern of LV diastolic filling.   4. Global right ventricle has normal systolic function.The right  ventricular size is normal. No increase in right ventricular wall  thickness.   5. Left atrial size was normal.   6. Right atrial size was normal.   7. Mild mitral annular calcification.   8. The mitral valve is grossly normal. Trace mitral valve regurgitation.   9. The tricuspid valve is grossly normal. Tricuspid valve regurgitation  is trivial.  10. The aortic valve is grossly normal Aortic valve regurgitation was not  visualized by color flow Doppler. Mild aortic valve sclerosis without  stenosis.  11. The pulmonic valve was grossly normal. Pulmonic valve regurgitation is  not visualized by color flow Doppler.  12. The interatrial septum was not well visualized.     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)    GI/Hepatic Neg liver ROS,GERD  Controlled and Medicated,,  Endo/Other  diabetes, Poorly Controlled, Type 2, Insulin  Dependent  A1c 11.2  Renal/GU negative Renal ROS  negative genitourinary   Musculoskeletal L great toe gangrene   Abdominal   Peds  Hematology negative hematology ROS (+) Hb 12.7, plt 196   Anesthesia Other Findings   Reproductive/Obstetrics negative OB ROS                              Anesthesia Physical Anesthesia Plan  ASA: 3  Anesthesia Plan: MAC   Post-op Pain Management: Tylenol  PO (pre-op)*   Induction: Intravenous  PONV Risk Score and Plan: 1 and Ondansetron , Dexamethasone , Midazolam  and Treatment may vary due to age or medical condition  Airway Management Planned: Natural Airway and Mask  Additional Equipment: None  Intra-op Plan:   Post-operative Plan:   Informed Consent: I have reviewed the patients History and Physical, chart, labs and discussed the procedure including the risks, benefits and alternatives for the proposed anesthesia with the patient or authorized representative who has indicated his/her understanding and acceptance.       Plan Discussed with: CRNA  Anesthesia Plan Comments:          Anesthesia Quick Evaluation

## 2024-03-29 NOTE — Plan of Care (Signed)

## 2024-03-29 NOTE — Anesthesia Procedure Notes (Signed)
 Procedure Name: MAC Date/Time: 03/29/2024 11:56 AM  Performed by: Boyce Shilling, CRNAPre-anesthesia Checklist: Patient identified, Emergency Drugs available, Suction available, Timeout performed and Patient being monitored Patient Re-evaluated:Patient Re-evaluated prior to induction Oxygen Delivery Method: Simple face mask Induction Type: IV induction Dental Injury: Teeth and Oropharynx as per pre-operative assessment

## 2024-03-29 NOTE — Op Note (Signed)
 Full Operative Report  Date of Operation: 1:01 PM, 03/29/2024   Patient: [name] - 68 y.o. male  Surgeon: Malvin Marsa FALCON, DPM   Assistant: None  Diagnosis: Gangrene of left great toe, ulcer of left foot to subcutaneous fat tissue level  Procedure:  1.  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  Irrigation and debridement with prep for graft of ulceration plantar first MPJ, 3 x 2 x 1 cm, left foot 3.  Application dermal allograft 3 x 2 cm, left foot    Anesthesia: Monitor Anesthesia Care  No responsible provider has been recorded for the case.  Anesthesiologist: Merla Almarie HERO, DO CRNA: Boyce Shilling, CRNA   Estimated Blood Loss: Minimal   Hemostasis: 1) Anatomical dissection, mechanical compression, electrocautery 2) no tourniquet was used during procedure  Implants: Implant Name Type Inv. Item Serial No. Manufacturer Lot No. LRB No. Used Action  TISSUE MATRIDERM XS 3.7X5.2X1 - [id number] Tissue TISSUE MATRIDERM XS 3.7X5.2X1  [employer] LC [id number] Left 1 Implanted    Materials: Prolene 3-0  Injectables: 1) Pre-operatively: 10 cc of 50:50 mixture 1%lidocaine  plain and 0.5% marcaine  plain 2) Post-operatively: None   Specimens: - Pathology: Left great toe for pathology - Microbiology: Deep tissue culture from ulceration plantar first MPJ, left foot   Antibiotics: IV antibiotics given per schedule on the floor  Drains: None  Complications: Patient tolerated the procedure well without complication.   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  The patient has failed conservative treatments  of various modalities. At this time the patient has elected to proceed with surgical correction. All alternatives, risks, and complications of the procedures were thoroughly explained to the patient. Patient exhibits appropriate understanding of all discussion points and informed consent was signed and obtained in the chart with no guarantees to surgical outcome given or implied.  Description of Procedure: Patient was brought to the operating room. Patient remained on their hospital bed in the supine position. A surgical timeout was performed and all members of the operating room, the procedure, and the surgical site were identified. anesthesia occurred as per anesthesia record. Local anesthetic as previously described was then injected about the operative field in a local infiltrative block.  The operative lower extremity as noted above was then prepped and draped in the usual sterile manner. The following procedure then began.  Attention was directed to the first digit on the LEFT foot. A full-thickness incision encompassing the entire digit was made using a #15 blade. Dissection was carried down to bone. The toe was secured with a towel clamp, further dissected in its entirety, and disarticulated at the MPJ and passed to the back table as a gross specimen. This was then labled and sent to pathology. The bone was noted to be soft and eroded distally, and consistent with osteomyelitis. All remaining necrotic and devitalized soft tissue structures were visualized and dissected away using sharp and dull dissection. Care was taken to protect all neurovascular structures throughout the dissection. All bleeders were cauterized as necessary.  The area was then flushed with copious amounts of sterile saline. Then using the suture materials previously described, the site  was closed in anatomic layers and the skin was well approximated under minimal tension.  Attention was then directed to the ulceration of the  plantar aspect of the first metatarsal phalangeal joint.  The wound had significant amount of necrotic and fibrotic tissues present in wound bed.  The wound measured 3 x 2 cm predebridement.  Excisional debridement with rongeur and 15 blade scalpel was then performed with excision of any necrotic and fibrotic fatty tissue present in the wound bed.  The wound did probe down to deep structures including FHL tendon but did not directly communicate with the sesamoids.  The area was debrided of any necrotic or fibrotic tissues excisionally with resection of significant amount of necrotic fat from the wound bed.  Following debridement the wound measured 3 x 2 x 1 cm postdebridement.  Good bleeding was noted following the debridement and at the tissue margins.  Hemostasis was achieved with electrocautery as needed.  Then, decision was made to apply an allograft for healing purposes.  A piece of Maitri Derm dermal allograft was then applied in double layer fashion to the wound.  This was stapled in place on all 4 sides of the wound and then moistened with saline so as to make it adherent to the wound bed.  A bolster Xeroform dressing was then applied over this.  No graft was wasted.  The total wound area grafted was 3 x 2 x 1 cm.  The surgical site was then dressed with Xeroform 4 x 4 gauze Kerlix and Ace wrap. The patient tolerated both the procedure and anesthesia well with vital signs stable throughout. The patient was transferred in good condition and all vital signs stable  from the OR to recovery under the discretion of anesthesia.  Condition: Vital signs stable, neurovascular status unchanged from preoperative   Surgical plan:  Likely was some level of early osteomyelitis at the proximal phalanx resection margin.  Believe I achieved clean margin there however cannot be certain regarding the plantar ulceration RE the depth was almost to the sesamoids and concern for possible osseous infection there.  Will  need long-term antibiotic therapy recommend ID consultation for this.  He is at high risk for needing further surgery including transmetatarsal amputation if he does not heal this ulceration.  He is okay to be weightbearing as tolerated in postop shoe though should decrease his activity is much as he is able.  Leave the dressing clean dry and intact for 5 to 7 days.  The patient will be weightbearing as tolerated in a postop shoe to the operative limb until further instructed. The dressing is to remain clean, dry, and intact. Will continue to follow unless noted elsewhere.   Marsa Honour, DPM [facility]

## 2024-03-29 NOTE — Progress Notes (Signed)
 Orthopedic Tech Progress Note Patient Details:  [name] May 10, 1956 [id number]  Called floor several times no answer. Called to see if patient brought POST OP SHOE with him today. I gave him one on the 21st of last month while patient was on 5N Patient ID: [name], male   DOB: 1956/06/06, 67 y.o.   MRN: [id number]  Delanna LITTIE Pac 03/29/2024, 4:03 PM

## 2024-03-30 ENCOUNTER — Encounter (HOSPITAL_COMMUNITY): Payer: Self-pay | Admitting: Podiatry

## 2024-03-30 ENCOUNTER — Other Ambulatory Visit: Payer: Self-pay

## 2024-03-30 DIAGNOSIS — L03116 Cellulitis of left lower limb: Secondary | ICD-10-CM | POA: Diagnosis not present

## 2024-03-30 DIAGNOSIS — Z89412 Acquired absence of left great toe: Secondary | ICD-10-CM

## 2024-03-30 DIAGNOSIS — M86672 Other chronic osteomyelitis, left ankle and foot: Secondary | ICD-10-CM

## 2024-03-30 LAB — GLUCOSE, CAPILLARY
Glucose-Capillary: 281 mg/dL — ABNORMAL HIGH (ref 70–99)
Glucose-Capillary: 277 mg/dL — ABNORMAL HIGH (ref 70–99)
Glucose-Capillary: 119 mg/dL — ABNORMAL HIGH (ref 70–99)
Glucose-Capillary: 123 mg/dL — ABNORMAL HIGH (ref 70–99)
Glucose-Capillary: 227 mg/dL — ABNORMAL HIGH (ref 70–99)

## 2024-03-30 NOTE — Progress Notes (Signed)
   Subjective: 1 Day Post-Op Procedure(s) (LRB): AMPUTATION LEFT GREAT TOE (Left) IRRIGATION AND DEBRIDEMENT PLANTAR FOOT WOUND (Left) DOS:   Objective: Vital signs in last 24 hours: Temp:  [97.8 F (36.6 C)-98.4 F (36.9 C)] 97.9 F (36.6 C) (08/20 2001) Pulse Rate:  [61-65] 62 (08/20 2001) Resp:  [16-18] 16 (08/20 2001) BP: (119-140)/(60-84) 140/83 (08/20 2001) SpO2:  [98 %-100 %] 99 % (08/20 2001)  Recent Labs    03/27/24 2336 03/28/24 0620 03/29/24 0428  HGB 13.4 13.2 12.7*   Recent Labs    03/28/24 0620 03/29/24 0428  WBC 10.3 8.6  RBC 4.09* 3.96*  HCT 37.9* 36.6*  PLT 200 196   Recent Labs    03/28/24 0620 03/29/24 0428  NA 137 143  K 3.3* 3.6  CL 107 107  CO2 27 27  BUN 9 8  CREATININE 0.68 0.73  GLUCOSE 135* 91  CALCIUM  8.6* 8.7*   Recent Labs    03/27/24 2336  INR 1.0   Aerobic/Anaerobic Culture w Gram Stain (surgical/deep wound) Order: [id number]  Status: Preliminary result     Next appt: 04/01/2024 at 01:00 PM in [facility] ([address] [place], [place])   Test Result Released: No   Specimen Information: PATH Bone resection; Tissue  0 Result Notes    Component Ref Range & Units (hover) 1 d ago  Specimen Description TISSUE  Special Requests NONE  Gram Stain RARE WBC PRESENT, PREDOMINANTLY PMN RARE GRAM NEGATIVE RODS  Culture FEW PSEUDOMONAS AERUGINOSA FEW ENTEROBACTER SPECIES RARE PROTEUS MIRABILIS CULTURE REINCUBATED FOR BETTER GROWTH Performed at [facility], [address], [place], [place] [address]  Report Status PENDING  Resulting Agency [facility]     Physical Exam: Dressings intact.  No strikethrough noted.  Assessment/Plan: 1 Day Post-Op Procedure(s) (LRB): AMPUTATION LEFT GREAT TOE (Left) IRRIGATION AND DEBRIDEMENT PLANTAR FOOT WOUND (Left) DOS: 03/29/2024  -Seen at bedside this evening -Dressings left clean and dry.  Do not remove dressings.  Graft was applied intraoperatively -WBAT postop shoe -ID  consult pending.  Greatly appreciated -From a podiatry standpoint patient okay for discharge.  He does have concerns about making his follow-up appointments in the office.  Ideally would like to have him come to the office 1 week postdischarge -Podiatry will sign off    Thresa CHRISTELLA Sar 03/30/2024, 8:27 PM  Thresa EMERSON Sar, DPM [facility]  Dr. Thresa EMERSON Sar, DPM    [address] [place], [place] [address]                Office [phone]  Fax [phone]

## 2024-03-30 NOTE — Progress Notes (Signed)
 PROGRESS NOTE    [name]  FMW:[id number] DOB: Apr 23, 1956 DOA: 03/27/2024 PCP: Norleen Lynwood LELON, MD   Brief Narrative:  68 y.o. male with a history of PAD, osteomyelitis s/p toe amputation, diabetes mellitus, hypertension, bladder cancer, paroxysmal atrial fibrillation, GERD, depression presented with increased foot swelling and discharge with evidence of cellulitis and infected foot wound.  He was started on broad-spectrum antibiotics and podiatry was consulted.  Vascular surgery was also consulted.  ABI was significant for right moderate disease and abnormal left toe brachial index as well.  He underwent surgical intervention by podiatry on 03/29/2024.  Assessment & Plan:   Diabetic left foot infection along with cellulitis - MRI of left foot showed no evidence of osteomyelitis but did support cellulitis. -ABI significant for right moderate disease. LE with abnormal left toe-brachial index as well. Left arterial duplex significant for moderate stenosis (50-74%) of the distal CFA, mild stenosis (30-49%) in the distal ATA and a patent SFA to above knee popliteal artery stent.  Vascular surgery recommended no intervention - Currently on Unasyn  and vancomycin . -underwent surgical intervention by podiatry on 03/29/2024 with amputation of left great toe at MPJ level along with I&D with graft for graft of ulceration plantar first MPJ.  Follow-up OR cultures.  Podiatry recommended ID consultation for possible long-term antibiotics.  I have consulted ID.  Peripheral artery disease - Outpatient follow-up with vascular surgery.  Plavix  on hold.  Resume Plavix  once cleared by podiatry.  Continue aspirin .  Diabetes mellitus type 2 with hyperglycemia -A1c 11.2.  Continue long-acting insulin  along with CBGs with SSI.    Hypertension--blood pressure stable.  Continue irbesartan   Hyperlipidemia -Continue statin  Depression -Continue Celexa   History of bladder cancer - Noted.  Outpatient follow-up  with urology  GERD -Continue Protonix   Hypokalemia -Resolved  History of paroxysmal A-fib -Not on rate control, rhythm control or anticoagulation.  Currently rate controlled     DVT prophylaxis: SCDs Code Status: Full Family Communication: None at bedside Disposition Plan: Status is: Inpatient Remains inpatient appropriate because: Of severity of illness.  Need for IV antibiotics    Consultants: Vascular surgery/podiatry.  Consult ID  Procedures: As above  Antimicrobials: Unasyn  and vancomycin  from 03/27/2024 onwards   Subjective: Patient seen and examined at bedside.  Complains of left foot pain.  No fever, vomiting, abdominal pain reported.  Objective: Vitals:   03/29/24 1658 03/29/24 2100 03/30/24 0004 03/30/24 0514  BP: (!) 172/69 123/70 119/73 130/76  Pulse: 64 65 64 62  Resp: 17 17    Temp: 98 F (36.7 C) 97.8 F (36.6 C) 98.4 F (36.9 C) 97.8 F (36.6 C)  TempSrc:  Oral    SpO2: 100% 100% 100% 100%  Weight:      Height:        Intake/Output Summary (Last 24 hours) at 03/30/2024 0807 Last data filed at 03/30/2024 0600 Gross per 24 hour  Intake 1744.53 ml  Output 5 ml  Net 1739.53 ml   Filed Weights   03/27/24 2328 03/29/24 1118  Weight: 69 kg 68.9 kg    Examination:  General exam: Appears calm and comfortable  Respiratory system: Bilateral decreased breath sounds at bases, no wheezing Cardiovascular system: S1 & S2 heard, Rate controlled Gastrointestinal system: Abdomen is mildly distended, soft and nontender. bowel sounds heard. Extremities: No cyanosis, clubbing; left foot dressing present Central nervous system: Alert and oriented. No obvious focal neurological deficits. Moving extremities Skin: No rashes, lesions Psychiatry: Flat affect.  Not agitated.  Data Reviewed: I have personally reviewed following labs and imaging studies  CBC: Recent Labs  Lab 03/27/24 2336 03/28/24 0620 03/29/24 0428  WBC 10.6* 10.3 8.6  NEUTROABS  6.6 6.7  --   HGB 13.4 13.2 12.7*  HCT 38.8* 37.9* 36.6*  MCV 94.9 92.7 92.4  PLT 220 200 196   Basic Metabolic Panel: Recent Labs  Lab 03/27/24 2336 03/28/24 0620 03/29/24 0428  NA 136 137 143  K 3.3* 3.3* 3.6  CL 100 107 107  CO2 27 27 27   GLUCOSE 309* 135* 91  BUN 9 9 8   CREATININE 0.82 0.68 0.73  CALCIUM  8.8* 8.6* 8.7*   GFR: Estimated Creatinine Clearance: 86.7 mL/min (by C-G formula based on SCr of 0.73 mg/dL). Liver Function Tests: Recent Labs  Lab 03/27/24 2336 03/28/24 0620  AST 15 14*  ALT 27 21  ALKPHOS 77 63  BILITOT 0.7 1.2  PROT 6.5 5.7*  ALBUMIN 2.9* 2.4*   No results for input(s): LIPASE, AMYLASE in the last 168 hours. No results for input(s): AMMONIA in the last 168 hours. Coagulation Profile: Recent Labs  Lab 03/27/24 2336  INR 1.0   Cardiac Enzymes: No results for input(s): CKTOTAL, CKMB, CKMBINDEX, TROPONINI in the last 168 hours. BNP (last 3 results) No results for input(s): PROBNP in the last 8760 hours. HbA1C: No results for input(s): HGBA1C in the last 72 hours. CBG: Recent Labs  Lab 03/29/24 1248 03/29/24 1552 03/29/24 1657 03/29/24 1959 03/30/24 0516  GLUCAP 76 167* 178* 278* 281*   Lipid Profile: No results for input(s): CHOL, HDL, LDLCALC, TRIG, CHOLHDL, LDLDIRECT in the last 72 hours. Thyroid  Function Tests: No results for input(s): TSH, T4TOTAL, FREET4, T3FREE, THYROIDAB in the last 72 hours. Anemia Panel: No results for input(s): VITAMINB12, FOLATE, FERRITIN, TIBC, IRON, RETICCTPCT in the last 72 hours. Sepsis Labs: Recent Labs  Lab 03/28/24 0011  LATICACIDVEN 1.5    Recent Results (from the past 240 hours)  Culture, blood (Routine x 2)     Status: None (Preliminary result)   Collection Time: 03/27/24 11:36 PM   Specimen: BLOOD  Result Value Ref Range Status   Specimen Description BLOOD BLOOD RIGHT ARM  Final   Special Requests   Final    BOTTLES DRAWN  AEROBIC AND ANAEROBIC Blood Culture adequate volume   Culture   Final    NO GROWTH 2 DAYS Performed at [facility], [address], [place], [place] [address]    Report Status PENDING  Incomplete  Culture, blood (Routine x 2)     Status: None (Preliminary result)   Collection Time: 03/27/24 11:36 PM   Specimen: BLOOD  Result Value Ref Range Status   Specimen Description BLOOD BLOOD LEFT ARM  Final   Special Requests   Final    BOTTLES DRAWN AEROBIC ONLY Blood Culture adequate volume   Culture   Final    NO GROWTH 2 DAYS Performed at [facility], [address], [place], [place] [address]    Report Status PENDING  Incomplete  Aerobic/Anaerobic Culture w Gram Stain (surgical/deep wound)     Status: None (Preliminary result)   Collection Time: 03/29/24 12:26 PM   Specimen: PATH Bone resection; Tissue  Result Value Ref Range Status   Specimen Description TISSUE  Final   Special Requests NONE  Final   Gram Stain   Final    RARE WBC PRESENT, PREDOMINANTLY PMN RARE GRAM NEGATIVE RODS Performed at [facility], [address] [place], [place] [address]    Culture PENDING  Incomplete   Report Status PENDING  Incomplete         Radiology Studies: DG Foot 2 Views Left Result Date: 03/29/2024 CLINICAL DATA:  Postoperative state EXAM: LEFT FOOT - 2 VIEW COMPARISON:  foot radiograph March 29, 2024 12 : 24 FINDINGS: Left great toe amputation. Status post irrigation and debridement of left foot wound with expected postsurgical changes and surgical clips . The fifth digit has also been resected at the level of the metatarsophalangeal joint. No acute fracture. No No new osseous lesion. Remainder of the soft tissue structures are within normal limits Impression Postsurgical changes at the region of left great toe amputation site. Electronically Signed   By: Megan  Zare M.D.   On: 03/29/2024 15:47   DG Foot 2 Views Left Result Date: 03/29/2024 EXAM: 2 VIEW(S) XRAY OF THE  LEFT FOOT 03/29/2024 12:27:37 PM COMPARISON: Left foot series dated 03/28/2024. CLINICAL HISTORY: 461500 Elective surgery [phone]. STAT intra-op image to rule out retained foreign object s/p broken scalpel. Call results STAT to OR18 at [phone]; Patient under anesthesia, surgeon waiting for results. FINDINGS: BONES AND JOINTS: Since the previous study, the patient has undergone amputation of the great toe at the level of the metatarsophalangeal joint. The fifth digit has also been resected at the level of the metatarsophalangeal joint. No acute fracture. No focal osseous lesion. No joint dislocation. SOFT TISSUES: The soft tissues are unremarkable. No foreign bodies evident. IMPRESSION: 1. No retained foreign object. 2. Status post amputation of the great toe and resection of the fifth digit at the level of the metatarsophalangeal joint. Electronically signed by: Evalene Coho MD 03/29/2024 12:36 PM EDT RP Workstation: [id number]   VAS US  LOWER EXTREMITY ARTERIAL DUPLEX Result Date: 03/28/2024 LOWER EXTREMITY ARTERIAL DUPLEX STUDY Patient Name:  [name]  Date of Exam:   03/28/2024 Medical Rec #: [id number]      Accession #:    [id number] Date of Birth: 1956-04-27      Patient Gender: M Patient Age:   66 years Exam Location:  [facility] Procedure:      VAS US  LOWER EXTREMITY ARTERIAL DUPLEX Referring Phys: FONDA ROBINS --------------------------------------------------------------------------------  Indications: Ulceration, gangrene, and peripheral artery disease. High Risk Factors: Hypertension, hyperlipidemia, Diabetes, current smoker,                    coronary artery disease.  Vascular Interventions: Left SFA stent placed 01/01/24, stent occluded and                         realigned with a 6 mm X 150 mm Eluvia stent 02/15/24. Left                         partial great toe amputation and left 5th digit                         amputation. Current ABI:            R: 0.61 L: 1.12 Comparison  Study: Prior Left LEA done 03/01/24 Performing Technologist: Alberta Lis RVS  Examination Guidelines: A complete evaluation includes B-mode imaging, spectral Doppler, color Doppler, and power Doppler as needed of all accessible portions of each vessel. Bilateral testing is considered an integral part of a complete examination. Limited examinations for reoccurring indications may be performed as noted.  +-----------+--------+-----+---------------+----------+--------+  LEFT       PSV cm/sRatioStenosis       Waveform  Comments +-----------+--------+-----+---------------+----------+--------+ CFA Prox   156                         biphasic           +-----------+--------+-----+---------------+----------+--------+ CFA Distal 257          50-74% stenosisbiphasic           +-----------+--------+-----+---------------+----------+--------+ DFA        92                          biphasic           +-----------+--------+-----+---------------+----------+--------+ SFA Prox   115                         biphasic           +-----------+--------+-----+---------------+----------+--------+ SFA Mid    129                         biphasic           +-----------+--------+-----+---------------+----------+--------+ SFA Distal 122                         biphasic           +-----------+--------+-----+---------------+----------+--------+ POP Prox   104                         biphasic           +-----------+--------+-----+---------------+----------+--------+ POP Distal 107                         biphasic           +-----------+--------+-----+---------------+----------+--------+ ATA Prox   114                         biphasic           +-----------+--------+-----+---------------+----------+--------+ ATA Mid    104                         biphasic           +-----------+--------+-----+---------------+----------+--------+ ATA Distal 159          30-49%  stenosisbiphasic           +-----------+--------+-----+---------------+----------+--------+ PTA Prox   138                         biphasic           +-----------+--------+-----+---------------+----------+--------+ PTA Mid    122                         biphasic           +-----------+--------+-----+---------------+----------+--------+ PTA Distal 81                          biphasic           +-----------+--------+-----+---------------+----------+--------+ PERO Prox  43                          monophasic         +-----------+--------+-----+---------------+----------+--------+  PERO Mid   57                          monophasic         +-----------+--------+-----+---------------+----------+--------+ PERO Distal50                          monophasic         +-----------+--------+-----+---------------+----------+--------+  LEFT Stent(s): +---------------------------+--------+--------+--------+--------+ SFA to Above Knee PoplitealPSV cm/sStenosisWaveformComments +---------------------------+--------+--------+--------+--------+ Prox to Stent              129             biphasic         +---------------------------+--------+--------+--------+--------+ Proximal Stent             139             biphasic         +---------------------------+--------+--------+--------+--------+ Mid Stent                  159             biphasic         +---------------------------+--------+--------+--------+--------+ Distal Stent               122             biphasic         +---------------------------+--------+--------+--------+--------+ Distal to Stent            100             biphasic         +---------------------------+--------+--------+--------+--------+    Summary: Left: 50-74% stenosis noted in the distal CFA and 30-49% stenosis noted in the distal ATA. The SFA to above knee popliteal artery stent is patent.  See table(s) above for measurements and  observations. Electronically signed by Fonda Rim on 03/28/2024 at 4:45:53 PM.    Final    VAS US  ABI WITH/WO TBI Result Date: 03/28/2024  LOWER EXTREMITY DOPPLER STUDY Patient Name:  ZAINE ELSASS  Date of Exam:   03/28/2024 Medical Rec #: [id number]      Accession #:    [id number] Date of Birth: 08/26/1955      Patient Gender: M Patient Age:   15 years Exam Location:  [facility] Procedure:      VAS US  ABI WITH/WO TBI Referring Phys: REDIA CLEAVER --------------------------------------------------------------------------------  Indications: Ulceration, gangrene, and peripheral artery disease. Partial left              great toe amputation 02/29/24 and left fifth toe amputation 01/02/24 High Risk Factors: Hypertension, hyperlipidemia, Diabetes, current smoker,                    coronary artery disease.  Vascular Interventions: Left SFA stent placed 01/01/24, stent occluded and was                         realigned with a 6 mm X 150 mm Eluvia stent 02/15/24 Comparison Study: Prior ABI done 03/01/2024 Performing Technologist: Rachel Pellet RVS  Examination Guidelines: A complete evaluation includes at minimum, Doppler waveform signals and systolic blood pressure reading at the level of bilateral brachial, anterior tibial, and posterior tibial arteries, when vessel segments are accessible. Bilateral testing is considered an integral part of a complete examination. Photoelectric Plethysmograph (PPG) waveforms and toe systolic pressure readings are included as required and additional  duplex testing as needed. Limited examinations for reoccurring indications may be performed as noted.  ABI Findings: +---------+------------------+-----+----------+--------+ Right    Rt Pressure (mmHg)IndexWaveform  Comment  +---------+------------------+-----+----------+--------+ Brachial 152                    triphasic          +---------+------------------+-----+----------+--------+ PTA      100                0.61 monophasic         +---------+------------------+-----+----------+--------+ DP       91                0.55 monophasic         +---------+------------------+-----+----------+--------+ Great Toe59                0.36 Abnormal           +---------+------------------+-----+----------+--------+ +---------+------------------+-----+---------+-------------------------------+ Left     Lt Pressure (mmHg)IndexWaveform Comment                         +---------+------------------+-----+---------+-------------------------------+ Brachial 164                    triphasic                                +---------+------------------+-----+---------+-------------------------------+ PTA      184               1.12 biphasic                                 +---------+------------------+-----+---------+-------------------------------+ DP       168               1.02 biphasic                                 +---------+------------------+-----+---------+-------------------------------+ Great Toe0                 0.00          great toe amp, 2nd toe assessed +---------+------------------+-----+---------+-------------------------------+ +-------+-----------+-----------------+------------+-------------+ ABI/TBIToday's ABIToday's TBI      Previous ABIPrevious TBI  +-------+-----------+-----------------+------------+-------------+ Right  0.61       0.36             0.55        dampened flow +-------+-----------+-----------------+------------+-------------+ Left   1.12       no flow 2nd digit0.88        amputation    +-------+-----------+-----------------+------------+-------------+ Right ABIs and TBIs appear increased compared to prior study on 03/01/24. Left ABIs appear increased compared to prior study on 03/01/24.  Summary: Right: Resting right ankle-brachial index indicates moderate right lower extremity arterial disease. The right toe-brachial index is abnormal. Left:  Resting left ankle-brachial index is within normal range. The left toe-brachial index is abnormal. *See table(s) above for measurements and observations.  Electronically signed by Fonda Rim on 03/28/2024 at 4:45:16 PM.    Final    MR FOOT LEFT WO CONTRAST Result Date: 03/28/2024 CLINICAL DATA:  Osteomyelitis suspected. EXAM: MRI OF THE LEFT FOOT WITHOUT CONTRAST TECHNIQUE: Multiplanar, multisequence MR imaging of the left forefoot was performed. No intravenous contrast was administered. COMPARISON:  Radiographs 03/28/2024 and 02/29/2024.  MRI 12/30/2023. FINDINGS: Bones/Joint/Cartilage Stable  postsurgical changes from previous amputation of the small toe and more recent amputation through the head of the 1st proximal phalanx. There is nonspecific mild irregularity along the amputation margin of the 1st proximal phalanx with a small amount of adjacent fluid. No progressive cortical destruction identified. The 2nd, 3rd and 4th toes appear intact. Minimal T2 hyperintensity within the 5th metatarsal head without cortical destruction or T1 marrow signal abnormality. Bipartite tibial sesamoid of the 1st metatarsal with decreased T2 marrow signal compared with previous MRI. No significant forefoot joint effusions. The alignment is normal at the Lisfranc joint. Similar mild chronic degenerative changes involving the medial cuneiform. Ligaments Intact Lisfranc ligament. The collateral ligaments of the remaining metatarsophalangeal joints appear intact. Muscles and Tendons Generalized muscular edema without focal fluid collection or significant tenosynovitis. 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 Soft tissues Compared with the previous MRI, interval increased diffuse subcutaneous edema. Apparent new skin ulceration in the medial plantar aspect of the forefoot with underlying ill-defined soft tissue fluid. No organized fluid collection, foreign body or definite soft tissue  emphysema. IMPRESSION: 1. Interval increased diffuse subcutaneous edema with apparent new skin ulceration in the medial plantar aspect of the forefoot, suspicious for soft tissue infection. No organized fluid collection, foreign body or definite soft tissue emphysema. 2. Stable postsurgical changes from previous amputation of the small toe and more recent amputation through the head of the 1st proximal phalanx. There is nonspecific mild irregularity along the amputation margin of the 1st proximal phalanx with a small amount of adjacent fluid. No progressive cortical destruction identified to strongly suggest recurrent osteomyelitis. 3. Minimal T2 hyperintensity within the 5th metatarsal head without cortical destruction or T1 marrow signal abnormality, likely reactive. 4. 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 Electronically Signed   By: Elsie Perone M.D.   On: 03/28/2024 10:08        Scheduled Meds:  aspirin  EC  81 mg Oral Daily   atorvastatin   40 mg Oral Daily   citalopram   20 mg Oral Daily   insulin  aspart  0-15 Units Subcutaneous TID WC   insulin  glargine  35 Units Subcutaneous Daily   irbesartan   300 mg Oral Daily   pantoprazole   40 mg Oral Daily   Continuous Infusions:  ampicillin -sulbactam (UNASYN ) IV 3 g (03/30/24 0529)   vancomycin  750 mg (03/30/24 0346)          Sophie Mao, MD [facility] 03/30/2024, 8:07 AM

## 2024-03-30 NOTE — Progress Notes (Signed)
 Orthopedic Tech Progress Note Patient Details:  KIETH HARTIS January 09, 1956 [id number]  Patient has POST OP SHOE, called floor and LPN forwarded my call to voice mail.  Patient ID: SAVVA BEAMER, male   DOB: 12/02/1955, 68 y.o.   MRN: [id number]  Delanna LITTIE Pac 03/30/2024, 8:41 AM

## 2024-03-30 NOTE — Consult Note (Signed)
 [facility]  Total days of antibiotics 4- vanco and amp/sub       Reason for Consult: left foot osteomyelitis    Referring Physician: cheryle  Principal Problem:   Cellulitis of foot, left Active Problems:   Essential hypertension   Bladder cancer (HCC)   PAD (peripheral artery disease) (HCC)   Insulin  dependent type 2 diabetes mellitus (HCC)   Ulcer of foot, left, with fat layer exposed (HCC)   Cellulitis   Gangrene of toe of left foot (HCC)   Osteomyelitis of toe of left foot (HCC)   Ulcer of left foot with necrosis of muscle (HCC)    HPI: [name] is a 68 y.o. male  with hx of HTN, PAD, T2DM, hx of DFU/DFO who was admitted for left foot great toe gangrene, and ulcer to left foot --where he was started on broad spectrum and abtx and underwent amputation of left great toe at [facility]. Since there is not convincingly clear margins, ID asked to weigh in on abtx for patient.  Patient lives in trailer but has limited access to electricity and water. Questionable that he had adequate care for his prior wounds. OR cultures thus far showing PsA, enterobacter and proteus m. Blood cx ngtd.   Patient reports pain control is adequate, somewhat insensate.  Past Medical History:  Diagnosis Date   Adenomatous colon polyp    Allergic rhinitis    At risk for sleep apnea    STOP-BANG= 5   SENT TO PCP 03-14-2014   Bladder cancer (HCC)    CAD (coronary artery disease)    a. 07/2014 low risk MV; b. 07/2019 Cor CTA (FFR): LM 25-49 (nl), LAD mild prox/mid plaque (nl), D1 25-49p(nl w/ abnl FFR of 0.73 in inf branch), LCX/OM1 mild prox/mid plaque (nl), RCA nondominant, minimal Ca2+ plaque (nl), RPDA (mildly abnl @ 0.79)-->Med Rx..   Cataract    surgically removed bilateral   Condyloma acuminatum of penis    Diabetic neuropathy (HCC)    Diastolic dysfunction    a. 05/2019 Echo: EF 60-65%, no rwam, mod LVH, impaired relaxation, nl RV size/fxn, trace MR, triv TR.    Diverticulosis    GERD (gastroesophageal reflux disease)    History of bladder cancer    s/p  turbt  2013/   transitional cell carcinoma--    History of condyloma acuminatum    PERINEAL AREA  W/ RECURRENCY   History of gout    Hyperlipidemia    Hypertension    Hypoxemia 03/09/2024   Lower urinary tract symptoms (LUTS)    PAF (paroxysmal atrial fibrillation) (HCC)    a. 06/2019 Event monitor: PAF <1% burden. Longest 3 mins 36 secs.   Productive cough    PSVT (paroxysmal supraventricular tachycardia) (HCC)    a. 06/2019 Event monitor: 112 episodes of SVT, longest 21 secs.   PVD (peripheral vascular disease) with claudication (HCC)    a. 03/2014 LE art duplex: long segment occlusion of mid to distal R SFA; b. 03/2020 ABI: nl left and mildly improved R ABI->med rx.   Renal artery stenosis (HCC)    Renal artery stenosis (HCC)    a. 12/2020 Renal art duplex: RRA 1-59%, LRA >60%.   Smokers' cough (HCC)    Type 2 diabetes mellitus with insulin  therapy (HCC) 1992   monitor by  dr ellsion   Wears dentures    upper    Allergies:  Allergies  Allergen Reactions   Cefepime  Hives  Had allergic reaction to one of these 3 agents, unclear which one   Metoprolol  Hives    Had allergic reaction to one of these 3 agents, unclear which one  *Per RN, highly likely this is the cause of allergic rxn   Myrbetriq  [Mirabegron ] Hives    Had allergic reaction to one of these 3 agents, unclear which one     MEDICATIONS:  aspirin  EC  81 mg Oral Daily   atorvastatin   40 mg Oral Daily   citalopram   20 mg Oral Daily   insulin  aspart  0-15 Units Subcutaneous TID WC   insulin  glargine  35 Units Subcutaneous Daily   irbesartan   300 mg Oral Daily   pantoprazole   40 mg Oral Daily    Social History   Tobacco Use   Smoking status: Every Day    Current packs/day: 0.00    Average packs/day: 1 pack/day for 41.0 years (41.0 ttl pk-yrs)    Types: Cigars, Cigarettes    Start date: 50    Last attempt to  quit: 2017    Years since quitting: 8.6   Smokeless tobacco: Never   Tobacco comments:    Quit cigarettes in 2017 but started back smoking Cigars  Vaping Use   Vaping status: Former  Substance Use Topics   Alcohol use: No    Alcohol/week: 0.0 standard drinks of alcohol   Drug use: No    Family History  Problem Relation Age of Onset   Hypertension Mother    Lung cancer Father    Diabetes Maternal Aunt        x 2   Colon cancer Neg Hx    Esophageal cancer Neg Hx    Pancreatic cancer Neg Hx    Prostate cancer Neg Hx    Kidney disease Neg Hx    Liver disease Neg Hx    Rectal cancer Neg Hx    Stomach cancer Neg Hx     Review of Systems -  12 point ros is negative except what is mentioned above  OBJECTIVE: Temp:  [97.8 F (36.6 C)-98.4 F (36.9 C)] 98.4 F (36.9 C) (08/20 2118) Pulse Rate:  [61-64] 62 (08/20 2118) Resp:  [16-18] 16 (08/20 2001) BP: (119-149)/(60-84) 149/71 (08/20 2118) SpO2:  [98 %-100 %] 100 % (08/20 2118) Physical Exam  Constitutional: He is oriented to person, place, and time. He appears well-developed and well-nourished. No distress.  HENT:  Mouth/Throat: Oropharynx is clear and moist. No oropharyngeal exudate.  Cardiovascular: Normal rate, regular rhythm and normal heart sounds. Exam reveals no gallop and no friction rub.  No murmur heard.  Pulmonary/Chest: Effort normal and breath sounds normal. No respiratory distress. He has no wheezes.  Abdominal: Soft. Bowel sounds are normal. He exhibits no distension. There is no tenderness.  Lymphadenopathy:  Ext: left foot is wrapped Psychiatric: He has a normal mood and affect. His behavior is normal.    LABS: Results for orders placed or performed during the hospital encounter of 03/27/24 (from the past 48 hours)  Glucose, capillary     Status: None   Collection Time: 03/28/24  9:34 PM  Result Value Ref Range   Glucose-Capillary 88 70 - 99 mg/dL    Comment: Glucose reference range applies only to  samples taken after fasting for at least 8 hours.  Basic metabolic panel with GFR     Status: Abnormal   Collection Time: 03/29/24  4:28 AM  Result Value Ref Range   Sodium 143 135 -  145 mmol/L   Potassium 3.6 3.5 - 5.1 mmol/L   Chloride 107 98 - 111 mmol/L   CO2 27 22 - 32 mmol/L   Glucose, Bld 91 70 - 99 mg/dL    Comment: Glucose reference range applies only to samples taken after fasting for at least 8 hours.   BUN 8 8 - 23 mg/dL   Creatinine, Ser 9.26 0.61 - 1.24 mg/dL   Calcium  8.7 (L) 8.9 - 10.3 mg/dL   GFR, Estimated >39 >39 mL/min    Comment: (NOTE) Calculated using the CKD-EPI Creatinine Equation (2021)    Anion gap 9 5 - 15    Comment: Performed at [facility], [address], [place], [place] [address]  CBC     Status: Abnormal   Collection Time: 03/29/24  4:28 AM  Result Value Ref Range   WBC 8.6 4.0 - 10.5 K/uL   RBC 3.96 (L) 4.22 - 5.81 MIL/uL   Hemoglobin 12.7 (L) 13.0 - 17.0 g/dL   HCT 63.3 (L) 60.9 - 47.9 %   MCV 92.4 80.0 - 100.0 fL   MCH 32.1 26.0 - 34.0 pg   MCHC 34.7 30.0 - 36.0 g/dL   RDW 86.7 88.4 - 84.4 %   Platelets 196 150 - 400 K/uL   nRBC 0.0 0.0 - 0.2 %    Comment: Performed at [facility], [address] [place], [place] [address]  Glucose, capillary     Status: Abnormal   Collection Time: 03/29/24  9:10 AM  Result Value Ref Range   Glucose-Capillary 60 (L) 70 - 99 mg/dL    Comment: Glucose reference range applies only to samples taken after fasting for at least 8 hours.  Glucose, capillary     Status: Abnormal   Collection Time: 03/29/24 10:13 AM  Result Value Ref Range   Glucose-Capillary 101 (H) 70 - 99 mg/dL    Comment: Glucose reference range applies only to samples taken after fasting for at least 8 hours.  Aerobic/Anaerobic Culture w Gram Stain (surgical/deep wound)     Status: None (Preliminary result)   Collection Time: 03/29/24 12:26 PM   Specimen: PATH Bone resection; Tissue  Result Value Ref Range   Specimen  Description TISSUE    Special Requests NONE    Gram Stain      RARE WBC PRESENT, PREDOMINANTLY PMN RARE GRAM NEGATIVE RODS    Culture      FEW PSEUDOMONAS AERUGINOSA FEW ENTEROBACTER SPECIES RARE PROTEUS MIRABILIS CULTURE REINCUBATED FOR BETTER GROWTH Performed at [facility], [address], [place], [place] [address]    Report Status PENDING   Glucose, capillary     Status: None   Collection Time: 03/29/24 12:48 PM  Result Value Ref Range   Glucose-Capillary 76 70 - 99 mg/dL    Comment: Glucose reference range applies only to samples taken after fasting for at least 8 hours.   Comment 1 Notify RN   Glucose, capillary     Status: Abnormal   Collection Time: 03/29/24  3:52 PM  Result Value Ref Range   Glucose-Capillary 167 (H) 70 - 99 mg/dL    Comment: Glucose reference range applies only to samples taken after fasting for at least 8 hours.  Glucose, capillary     Status: Abnormal   Collection Time: 03/29/24  4:57 PM  Result Value Ref Range   Glucose-Capillary 178 (H) 70 - 99 mg/dL    Comment: Glucose reference range applies only to  samples taken after fasting for at least 8 hours.  Glucose, capillary     Status: Abnormal   Collection Time: 03/29/24  7:59 PM  Result Value Ref Range   Glucose-Capillary 278 (H) 70 - 99 mg/dL    Comment: Glucose reference range applies only to samples taken after fasting for at least 8 hours.  Glucose, capillary     Status: Abnormal   Collection Time: 03/30/24  5:16 AM  Result Value Ref Range   Glucose-Capillary 281 (H) 70 - 99 mg/dL    Comment: Glucose reference range applies only to samples taken after fasting for at least 8 hours.  Glucose, capillary     Status: Abnormal   Collection Time: 03/30/24  9:11 AM  Result Value Ref Range   Glucose-Capillary 277 (H) 70 - 99 mg/dL    Comment: Glucose reference range applies only to samples taken after fasting for at least 8 hours.  Glucose, capillary     Status: Abnormal   Collection Time:  03/30/24 12:15 PM  Result Value Ref Range   Glucose-Capillary 119 (H) 70 - 99 mg/dL    Comment: Glucose reference range applies only to samples taken after fasting for at least 8 hours.  Glucose, capillary     Status: Abnormal   Collection Time: 03/30/24  5:32 PM  Result Value Ref Range   Glucose-Capillary 123 (H) 70 - 99 mg/dL    Comment: Glucose reference range applies only to samples taken after fasting for at least 8 hours.  Glucose, capillary     Status: Abnormal   Collection Time: 03/30/24  9:15 PM  Result Value Ref Range   Glucose-Capillary 227 (H) 70 - 99 mg/dL    Comment: Glucose reference range applies only to samples taken after fasting for at least 8 hours.   Lab Results  Component Value Date   ESRSEDRATE 11 03/28/2024   Lab Results  Component Value Date   CRP <0.5 03/28/2024     MICRO: Gram Stain RARE WBC PRESENT, PREDOMINANTLY PMN RARE GRAM NEGATIVE RODS  Culture FEW PSEUDOMONAS AERUGINOSA FEW ENTEROBACTER SPECIES RARE PROTEUS MIRABILIS CULTURE REINCUBATED FOR BETTER GROWTH   IMAGING: DG Foot 2 Views Left Result Date: 03/29/2024 CLINICAL DATA:  Postoperative state EXAM: LEFT FOOT - 2 VIEW COMPARISON:  foot radiograph March 29, 2024 12 : 24 FINDINGS: Left great toe amputation. Status post irrigation and debridement of left foot wound with expected postsurgical changes and surgical clips . The fifth digit has also been resected at the level of the metatarsophalangeal joint. No acute fracture. No No new osseous lesion. Remainder of the soft tissue structures are within normal limits Impression Postsurgical changes at the region of left great toe amputation site. Electronically Signed   By: Megan  Zare M.D.   On: 03/29/2024 15:47   DG Foot 2 Views Left Result Date: 03/29/2024 EXAM: 2 VIEW(S) XRAY OF THE LEFT FOOT 03/29/2024 12:27:37 PM COMPARISON: Left foot series dated 03/28/2024. CLINICAL HISTORY: [id number] Elective surgery [phone]. STAT intra-op image to rule out  retained foreign object s/p broken scalpel. Call results STAT to OR18 at [phone]; Patient under anesthesia, surgeon waiting for results. FINDINGS: BONES AND JOINTS: Since the previous study, the patient has undergone amputation of the great toe at the level of the metatarsophalangeal joint. The fifth digit has also been resected at the level of the metatarsophalangeal joint. No acute fracture. No focal osseous lesion. No joint dislocation. SOFT TISSUES: The soft tissues are unremarkable. No foreign bodies evident. IMPRESSION: 1.  No retained foreign object. 2. Status post amputation of the great toe and resection of the fifth digit at the level of the metatarsophalangeal joint. Electronically signed by: Evalene Coho MD 03/29/2024 12:36 PM EDT RP Workstation: [id number]    Assessment/Plan:    Left great toe osteomyelitis s/p amputation but may have residual osteomyelitis. Thus far appears polymicrobial. Await sensitivities with hopes to provide oral abtx regimen and avoid picc line given limited resources  - continue on iv abtx  For the time being - will recommend to follow wound care recommendations for podiatry - it will take another 24-48hrs to get sensitivity data to make final abtx recommendations - anticipate to treat for 6 wk  Alyas Creary B. Luiz MD MPH [facility] [phone]

## 2024-03-30 NOTE — Evaluation (Signed)
 Physical Therapy Evaluation Patient Details Name: [name] MRN: [id number] DOB: 04/15/1956 Today's Date: 03/30/2024  History of Present Illness  68 y.o. male presents to [facility] hospital on 03/27/2024 with L foot wound. Pt underwent L great toe amputation at [facility] on 8/19. PMH includes PAD, DM, HTN, bladder CA, PAF, GERD.  Clinical Impression  Pt presents to PT with deficits in gait, balance, safety awareness. Pt is able to transfer and ambulate for short household distances however he has multiple instances of lateral staggering which he must correct with stepping strategy. PT attempts to encourage use of a RW to improve stability and to offload LLE however the pt appears to dismiss this. 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 Podiatry op note suggests limiting activity as much as possible. PT recommends HHPT at the time of discharge.        If plan is discharge home, recommend the following: A little help with bathing/dressing/bathroom;Assistance with cooking/housework;Assist for transportation;Help with stairs or ramp for entrance   Can travel by private vehicle   Yes    Equipment Recommendations None recommended by PT (pt is encouraged to utilize a RW, he reports he owns one)  Recommendations for Other Services       Functional Status Assessment Patient has had a recent decline in their functional status and demonstrates the ability to make significant improvements in function in a reasonable and predictable amount of time.     Precautions / Restrictions Precautions Precautions: Fall Recall of Precautions/Restrictions: Intact Precaution/Restrictions Comments: Post-op shoe on L foot Required Braces or Orthoses: Other Brace Other Brace: Post-op shoe Restrictions Weight Bearing Restrictions Per Provider Order: Yes LLE Weight Bearing Per Provider Order: Weight bearing  as tolerated Other Position/Activity Restrictions: WBAT in post op shoe      Mobility  Bed Mobility Overal bed mobility: Independent                  Transfers Overall transfer level: Needs assistance Equipment used: None Transfers: Sit to/from Stand Sit to Stand: Supervision                Ambulation/Gait Ambulation/Gait assistance: Supervision Gait Distance (Feet): 100 Feet Assistive device: None Gait Pattern/deviations: Step-through pattern, Staggering right, Staggering left Gait velocity: reduced Gait velocity interpretation: <1.8 ft/sec, indicate of risk for recurrent falls   General Gait Details: pt with multiple lateral losses of balance when ambulating. PT attempts to encourage use of RW however pt reports it is not needed as he does not walk much in the community. PT provides clarification that the RW may be beneficial even within the home to offload LLE and to improve stability however pt appears to continue to dismiss this  Stairs            Wheelchair Mobility     Tilt Bed    Modified Rankin (Stroke Patients Only)       Balance Overall balance assessment: Needs assistance Sitting-balance support: No upper extremity supported, Feet supported Sitting balance-Leahy Scale: Good     Standing balance support: No upper extremity supported, During functional activity Standing balance-Leahy Scale: Fair                               Pertinent Vitals/Pain Pain Assessment Pain Assessment: 0-10 Pain Score: 5  Pain Location: L foot  Pain Descriptors / Indicators: Sore Pain Intervention(s): Monitored during session    Home Living Family/patient expects to be discharged to:: Private residence Living Arrangements: Non-relatives/Friends Available Help at Discharge: Neighbor;Other (Comment) Type of Home: Mobile home Home Access: Ramped entrance       Home Layout: One level Home Equipment: Agricultural consultant (2 wheels);Wheelchair -  manual;Cane - single point      Prior Function Prior Level of Function : Independent/Modified Independent;Driving             Mobility Comments: ambulatory without DME       Extremity/Trunk Assessment   Upper Extremity Assessment Upper Extremity Assessment: Overall WFL for tasks assessed    Lower Extremity Assessment Lower Extremity Assessment: LLE deficits/detail LLE Deficits / Details: pt with chronic drop foot    Cervical / Trunk Assessment Cervical / Trunk Assessment: Normal  Communication   Communication Communication: No apparent difficulties    Cognition Arousal: Alert Behavior During Therapy: Impulsive   PT - Cognitive impairments: Safety/Judgement                       PT - Cognition Comments: pt appears unwilling to utilize DME despite instability during ambulation. Also seems to have a poor understanding of how limited his mobility may need to be to allow for wound healing Following commands: Intact       Cueing Cueing Techniques: Verbal cues     General Comments General comments (skin integrity, edema, etc.): VSS on RA    Exercises     Assessment/Plan    PT Assessment Patient needs continued PT services  PT Problem List Pain;Decreased activity tolerance;Decreased mobility;Decreased knowledge of precautions       PT Treatment Interventions Gait training;Therapeutic exercise;Functional mobility training;Therapeutic activities;Patient/family education    PT Goals (Current goals can be found in the Care Plan section)  Acute Rehab PT Goals Patient Stated Goal: to return home PT Goal Formulation: With patient Time For Goal Achievement: 04/13/24 Potential to Achieve Goals: Good Additional Goals Additional Goal #1: Pt will score >19/24 on the DGI to indicate a reduced risk for falls    Frequency Min 2X/week     Co-evaluation               AM-PAC PT 6 Clicks Mobility  Outcome Measure Help needed turning from your back to  your side while in a flat bed without using bedrails?: None Help needed moving from lying on your back to sitting on the side of a flat bed without using bedrails?: None Help needed moving to and from a bed to a chair (including a wheelchair)?: A Little Help needed standing up from a chair using your arms (e.g., wheelchair or bedside chair)?: A Little Help needed to walk in hospital room?: A Little Help needed climbing 3-5 steps with a railing? : A Little 6 Click Score: 20    End of Session Equipment Utilized During Treatment: Gait belt Activity Tolerance: Patient tolerated treatment well Patient left: in bed;with call bell/phone within reach Nurse Communication: Mobility status PT Visit Diagnosis: Difficulty in walking, not elsewhere classified (R26.2);Pain Pain - Right/Left: Left Pain - part of body: Ankle and joints of foot    Time: 1152-1207 PT Time Calculation (min) (ACUTE ONLY): 15 min   Charges:   PT Evaluation $PT Eval Low Complexity: 1 Low   PT General Charges $$ ACUTE PT VISIT: 1 Visit         Bernardino JINNY Ruth, PT, DPT Acute  Rehabilitation Office [phone]   Bernardino JINNY Ruth 03/30/2024, 12:21 PM

## 2024-03-30 NOTE — Plan of Care (Signed)

## 2024-03-30 NOTE — TOC Progression Note (Addendum)
 Transition of Care [facility]) - Progression Note    Patient Details  Name: [name] MRN: [id number] Date of Birth: 14-Feb-1956  [facility] CM/SW Contact  Rosaline JONELLE Joe, RN Phone Number: 03/30/2024, 2:55 PM  Clinical Narrative:    CM spoke with the patient at the bedside and patient continues to live in his home with roommate at [address], [place], [place].  Patient has no electricity at his home and is unable to have services returned to the trailer due to heavy fines that he cannot afford to pay.  Patient owes a large sum and hospital and [facility] DSS are unable to assist with this expense - per last admission discussion with hospital leadership.  Patient plans to return to his home and states that he can't stay with his friend, who has electricity. MD was updated.  Patient is S/P Left big toe amputation and currently is in post-operative shoe with no drains present.  DME in the home includes Rw, WC and Cane.  Patient states that [facility] DSS is in the process of being his payee.  ID MD was updated regarding patient's living conditions at his trailer and patient's plan to return to the trailer when he is discharged.  CM with IP Care management will continue to follow the patient as he progresses.                     Expected Discharge Plan and Services                                               Social Drivers of Health (SDOH) Interventions SDOH Screenings   Food Insecurity: Food Insecurity Present (03/28/2024)  Housing: High Risk (03/28/2024)  Transportation Needs: Unmet Transportation Needs (03/28/2024)  Utilities: At Risk (03/28/2024)  Alcohol Screen: Low Risk  (06/10/2023)  Depression (PHQ2-9): Low Risk  (03/18/2024)  Financial Resource Strain: High Risk (03/11/2024)  Physical Activity: Inactive (06/10/2023)  Social Connections: Socially Isolated (03/28/2024)  Stress: Stress Concern Present (03/11/2024)  Tobacco  Use: High Risk (03/29/2024)  Health Literacy: Inadequate Health Literacy (02/17/2024)    Readmission Risk Interventions    03/30/2024    2:55 PM 03/09/2024    3:21 PM 12/30/2023    1:17 PM  Readmission Risk Prevention Plan  Post Dischage Appt   Complete  Medication Screening   Complete  Transportation Screening Complete Complete Complete  PCP or Specialist Appt within 5-7 Days  Complete   PCP or Specialist Appt within 3-5 Days Complete    Home Care Screening  Complete   Medication Review (RN CM)  Complete   HRI or Home Care Consult Complete    Social Work Consult for Recovery Care Planning/Counseling Complete    Palliative Care Screening Complete    Medication Review Oceanographer) Referral to Pharmacy

## 2024-03-31 ENCOUNTER — Telehealth: Admitting: *Deleted

## 2024-03-31 DIAGNOSIS — L03116 Cellulitis of left lower limb: Secondary | ICD-10-CM | POA: Diagnosis not present

## 2024-03-31 LAB — CBC WITH DIFFERENTIAL/PLATELET
Abs Immature Granulocytes: 0.02 K/uL (ref 0.00–0.07)
Basophils Absolute: 0.1 K/uL (ref 0.0–0.1)
Basophils Relative: 1 %
Eosinophils Absolute: 0.4 K/uL (ref 0.0–0.5)
Eosinophils Relative: 6 %
HCT: 34.2 % — ABNORMAL LOW (ref 39.0–52.0)
Hemoglobin: 11.9 g/dL — ABNORMAL LOW (ref 13.0–17.0)
Immature Granulocytes: 0 %
Lymphocytes Relative: 29 %
Lymphs Abs: 2.1 K/uL (ref 0.7–4.0)
MCH: 32 pg (ref 26.0–34.0)
MCHC: 34.8 g/dL (ref 30.0–36.0)
MCV: 91.9 fL (ref 80.0–100.0)
Monocytes Absolute: 0.6 K/uL (ref 0.1–1.0)
Monocytes Relative: 9 %
Neutro Abs: 4 K/uL (ref 1.7–7.7)
Neutrophils Relative %: 55 %
Platelets: 184 K/uL (ref 150–400)
RBC: 3.72 MIL/uL — ABNORMAL LOW (ref 4.22–5.81)
RDW: 13 % (ref 11.5–15.5)
WBC: 7.2 K/uL (ref 4.0–10.5)
nRBC: 0 % (ref 0.0–0.2)

## 2024-03-31 LAB — BASIC METABOLIC PANEL WITH GFR
Anion gap: 9 (ref 5–15)
BUN: 11 mg/dL (ref 8–23)
CO2: 28 mmol/L (ref 22–32)
Calcium: 8.2 mg/dL — ABNORMAL LOW (ref 8.9–10.3)
Chloride: 102 mmol/L (ref 98–111)
Creatinine, Ser: 0.76 mg/dL (ref 0.61–1.24)
GFR, Estimated: >60 mL/min (ref >60)
Glucose, Bld: 213 mg/dL — ABNORMAL HIGH (ref 70–99)
Potassium: 3.6 mmol/L (ref 3.5–5.1)
Sodium: 139 mmol/L (ref 135–145)

## 2024-03-31 LAB — GLUCOSE, CAPILLARY
Glucose-Capillary: 136 mg/dL — ABNORMAL HIGH (ref 70–99)
Glucose-Capillary: 188 mg/dL — ABNORMAL HIGH (ref 70–99)
Glucose-Capillary: 179 mg/dL — ABNORMAL HIGH (ref 70–99)
Glucose-Capillary: 158 mg/dL — ABNORMAL HIGH (ref 70–99)

## 2024-03-31 LAB — MAGNESIUM: Magnesium: 1.1 mg/dL — ABNORMAL LOW (ref 1.7–2.4)

## 2024-03-31 LAB — C-REACTIVE PROTEIN: CRP: 0.8 mg/dL (ref <1.0)

## 2024-03-31 LAB — SURGICAL PATHOLOGY

## 2024-03-31 MED ORDER — PIPERACILLIN-TAZOBACTAM 3.375 G IVPB
3.3750 g | Freq: Three times a day (TID) | INTRAVENOUS | Status: DC
  Administered 2024-03-31: 3.375 g via INTRAVENOUS
  Filled 2024-03-31: qty 50

## 2024-03-31 MED ORDER — MAGNESIUM SULFATE 4 GM/100ML IV SOLN
4.0000 g | Freq: Once | INTRAVENOUS | Status: AC
  Administered 2024-03-31: 4 g via INTRAVENOUS
  Filled 2024-03-31 (×2): qty 100

## 2024-03-31 MED ORDER — SODIUM CHLORIDE 0.9 % IV SOLN
1.0000 g | Freq: Three times a day (TID) | INTRAVENOUS | Status: DC
  Administered 2024-03-31 – 2024-04-01 (×4): 1 g via INTRAVENOUS
  Filled 2024-03-31 (×4): qty 20

## 2024-03-31 NOTE — Plan of Care (Signed)
  Problem: Fluid Volume: Goal: Ability to maintain a balanced intake and output will improve Outcome: Progressing   Problem: Health Behavior/Discharge Planning: Goal: Ability to manage health-related needs will improve Outcome: Progressing   Problem: Metabolic: Goal: Ability to maintain appropriate glucose levels will improve Outcome: Progressing   Problem: Nutritional: Goal: Maintenance of adequate nutrition will improve Outcome: Progressing   Problem: Skin Integrity: Goal: Risk for impaired skin integrity will decrease Outcome: Progressing   Problem: Education: Goal: Knowledge of General Education information will improve Description: Including pain rating scale, medication(s)/side effects and non-pharmacologic comfort measures Outcome: Progressing   Problem: Clinical Measurements: Goal: Respiratory complications will improve Outcome: Progressing   Problem: Clinical Measurements: Goal: Cardiovascular complication will be avoided Outcome: Progressing   Problem: Activity: Goal: Risk for activity intolerance will decrease Outcome: Progressing   Problem: Nutrition: Goal: Adequate nutrition will be maintained Outcome: Progressing   Problem: Elimination: Goal: Will not experience complications related to bowel motility Outcome: Progressing   Problem: Safety: Goal: Ability to remain free from injury will improve Outcome: Progressing

## 2024-03-31 NOTE — Care Management Important Message (Signed)
 Important Message  Patient Details  Name: [name] MRN: [id number] Date of Birth: 02-21-1956   Important Message Given:  Yes - Medicare IM     Claretta Deed 03/31/2024, 2:10 PM

## 2024-03-31 NOTE — Progress Notes (Signed)
 Mobility Specialist: Progress Note   03/31/24 1500  Mobility  Activity Ambulated with assistance  Level of Assistance Standby assist, set-up cues, supervision of patient - no hands on  Assistive Device None  Distance Ambulated (ft) 300 ft  LLE Weight Bearing Per Provider Order WBAT  Activity Response Tolerated well  Mobility Referral Yes  Mobility visit 1 Mobility  Mobility Specialist Start Time (ACUTE ONLY) 1050  Mobility Specialist Stop Time (ACUTE ONLY) 1107  Mobility Specialist Time Calculation (min) (ACUTE ONLY) 17 min    Pt received standing in the hallway, agreeable to mobility session. SV throughout. No complaints. Returned to room without fault. Left on EOB with all needs met, call bell in reach.   Ileana Lute Mobility Specialist Please contact via SecureChat or Rehab office at [phone]

## 2024-03-31 NOTE — Progress Notes (Signed)
 PROGRESS NOTE    [name]  FMW:[id number] DOB: 03-18-1956 DOA: 03/27/2024 PCP: Norleen Lynwood LELON, MD   Brief Narrative:  68 y.o. male with a history of PAD, osteomyelitis s/p toe amputation, diabetes mellitus, hypertension, bladder cancer, paroxysmal atrial fibrillation, GERD, depression presented with increased foot swelling and discharge with evidence of cellulitis and infected foot wound.  He was started on broad-spectrum antibiotics and podiatry was consulted.  Vascular surgery was also consulted.  ABI was significant for right moderate disease and abnormal left toe brachial index as well.  He underwent surgical intervention by podiatry on 03/29/2024.  ID was subsequently consulted on 03/30/2024.  Assessment & Plan:   Diabetic left foot infection along with cellulitis - MRI of left foot showed no evidence of osteomyelitis but did support cellulitis. -ABI significant for right moderate disease. LE with abnormal left toe-brachial index as well. Left arterial duplex significant for moderate stenosis (50-74%) of the distal CFA, mild stenosis (30-49%) in the distal ATA and a patent SFA to above knee popliteal artery stent.  Vascular surgery recommended no intervention - Currently on Unasyn  and vancomycin . -underwent surgical intervention by podiatry on 03/29/2024 with amputation of left great toe at MPJ level along with I&D with graft for graft of ulceration plantar first MPJ.  Follow-up OR cultures: Currently growing Pseudomonas, Enterobacter, Proteus.  Switch Unasyn  to Zosyn .  ID following.  Podiatry signed off on 03/30/2024.  Outpatient follow-up with podiatry.  Wound care as per podiatry.  Peripheral artery disease - Outpatient follow-up with vascular surgery.  Plavix  on hold.  Resume Plavix  once cleared by podiatry.  Continue aspirin .  Diabetes mellitus type 2 with hyperglycemia -A1c 11.2.  Continue long-acting insulin  along with CBGs with SSI.    Hypertension--blood pressure stable.   Continue irbesartan   Hyperlipidemia -Continue statin  Depression -Continue Celexa   History of bladder cancer - Noted.  Outpatient follow-up with urology  GERD -Continue Protonix   Hypokalemia -Resolved  Hypomagnesemia - Replace  History of paroxysmal A-fib -Not on rate control, rhythm control or anticoagulation.  Currently rate controlled     DVT prophylaxis: SCDs Code Status: Full Family Communication: None at bedside Disposition Plan: Status is: Inpatient Remains inpatient appropriate because: Of severity of illness.  Need for IV antibiotics    Consultants: Vascular surgery/podiatry.  ID  Procedures: As above  Antimicrobials: Unasyn  and vancomycin  from 03/27/2024 onwards   Subjective: Patient seen and examined at bedside.  Denies worsening abdominal, fever, vomiting.  Continues to have intermittent left foot pain. Objective: Vitals:   03/30/24 1223 03/30/24 2001 03/30/24 2118 03/31/24 0023  BP: (!) 140/84 (!) 140/83 (!) 149/71 132/68  Pulse: 64 62 62 (!) 59  Resp: 18 16    Temp: 97.8 F (36.6 C) 97.9 F (36.6 C) 98.4 F (36.9 C) (!) 97.4 F (36.3 C)  TempSrc: Oral Oral    SpO2: 99% 99% 100% 100%  Weight:      Height:        Intake/Output Summary (Last 24 hours) at 03/31/2024 0743 Last data filed at 03/31/2024 0600 Gross per 24 hour  Intake 500 ml  Output --  Net 500 ml   Filed Weights   03/27/24 2328 03/29/24 1118  Weight: 69 kg 68.9 kg    Examination:  General: On room air.  No distress ENT/neck: No thyromegaly.  JVD is not elevated  respiratory: Decreased breath sounds at bases bilaterally with some crackles; no wheezing  CVS: S1-S2 heard, rate controlled currently Abdominal: Soft, nontender, slightly distended;  no organomegaly, bowel sounds are heard Extremities: Left foot has dressing  CNS: Awake and alert.  Slow to respond.  No focal neurologic deficit.  Moves extremities Lymph: No obvious lymphadenopathy Skin: No obvious  ecchymosis/lesions  psych: Mostly flat affect.  Currently not agitated     Data Reviewed: I have personally reviewed following labs and imaging studies  CBC: Recent Labs  Lab 03/27/24 2336 03/28/24 0620 03/29/24 0428 03/31/24 0237  WBC 10.6* 10.3 8.6 7.2  NEUTROABS 6.6 6.7  --  4.0  HGB 13.4 13.2 12.7* 11.9*  HCT 38.8* 37.9* 36.6* 34.2*  MCV 94.9 92.7 92.4 91.9  PLT 220 200 196 184   Basic Metabolic Panel: Recent Labs  Lab 03/27/24 2336 03/28/24 0620 03/29/24 0428 03/31/24 0237  NA 136 137 143 139  K 3.3* 3.3* 3.6 3.6  CL 100 107 107 102  CO2 27 27 27 28   GLUCOSE 309* 135* 91 213*  BUN 9 9 8 11   CREATININE 0.82 0.68 0.73 0.76  CALCIUM  8.8* 8.6* 8.7* 8.2*  MG  --   --   --  1.1*   GFR: Estimated Creatinine Clearance: 86.7 mL/min (by C-G formula based on SCr of 0.76 mg/dL). Liver Function Tests: Recent Labs  Lab 03/27/24 2336 03/28/24 0620  AST 15 14*  ALT 27 21  ALKPHOS 77 63  BILITOT 0.7 1.2  PROT 6.5 5.7*  ALBUMIN 2.9* 2.4*   No results for input(s): LIPASE, AMYLASE in the last 168 hours. No results for input(s): AMMONIA in the last 168 hours. Coagulation Profile: Recent Labs  Lab 03/27/24 2336  INR 1.0   Cardiac Enzymes: No results for input(s): CKTOTAL, CKMB, CKMBINDEX, TROPONINI in the last 168 hours. BNP (last 3 results) No results for input(s): PROBNP in the last 8760 hours. HbA1C: No results for input(s): HGBA1C in the last 72 hours. CBG: Recent Labs  Lab 03/30/24 0911 03/30/24 1215 03/30/24 1732 03/30/24 2115 03/31/24 0536  GLUCAP 277* 119* 123* 227* 136*   Lipid Profile: No results for input(s): CHOL, HDL, LDLCALC, TRIG, CHOLHDL, LDLDIRECT in the last 72 hours. Thyroid  Function Tests: No results for input(s): TSH, T4TOTAL, FREET4, T3FREE, THYROIDAB in the last 72 hours. Anemia Panel: No results for input(s): VITAMINB12, FOLATE, FERRITIN, TIBC, IRON, RETICCTPCT in the last 72  hours. Sepsis Labs: Recent Labs  Lab 03/28/24 0011  LATICACIDVEN 1.5    Recent Results (from the past 240 hours)  Culture, blood (Routine x 2)     Status: None (Preliminary result)   Collection Time: 03/27/24 11:36 PM   Specimen: BLOOD  Result Value Ref Range Status   Specimen Description BLOOD BLOOD RIGHT ARM  Final   Special Requests   Final    BOTTLES DRAWN AEROBIC AND ANAEROBIC Blood Culture adequate volume   Culture   Final    NO GROWTH 3 DAYS Performed at [facility], [address], [place], [place] [address]    Report Status PENDING  Incomplete  Culture, blood (Routine x 2)     Status: None (Preliminary result)   Collection Time: 03/27/24 11:36 PM   Specimen: BLOOD  Result Value Ref Range Status   Specimen Description BLOOD BLOOD LEFT ARM  Final   Special Requests   Final    BOTTLES DRAWN AEROBIC ONLY Blood Culture adequate volume   Culture   Final    NO GROWTH 3 DAYS Performed at [facility], [address], [place], [place] [address]    Report Status PENDING  Incomplete  Aerobic/Anaerobic Culture w Gram Stain (surgical/deep wound)     Status: None (Preliminary result)   Collection Time: 03/29/24 12:26 PM   Specimen: PATH Bone resection; Tissue  Result Value Ref Range Status   Specimen Description TISSUE  Final   Special Requests NONE  Final   Gram Stain   Final    RARE WBC PRESENT, PREDOMINANTLY PMN RARE GRAM NEGATIVE RODS    Culture   Final    FEW PSEUDOMONAS AERUGINOSA FEW ENTEROBACTER SPECIES RARE PROTEUS MIRABILIS CULTURE REINCUBATED FOR BETTER GROWTH Performed at [facility], [address] [place], [place] [address]    Report Status PENDING  Incomplete         Radiology Studies: DG Foot 2 Views Left Result Date: 03/29/2024 CLINICAL DATA:  Postoperative state EXAM: LEFT FOOT - 2 VIEW COMPARISON:  foot radiograph March 29, 2024 12 : 24 FINDINGS: Left great toe amputation. Status post irrigation and debridement of left  foot wound with expected postsurgical changes and surgical clips . The fifth digit has also been resected at the level of the metatarsophalangeal joint. No acute fracture. No No new osseous lesion. Remainder of the soft tissue structures are within normal limits Impression Postsurgical changes at the region of left great toe amputation site. Electronically Signed   By: Megan  Zare M.D.   On: 03/29/2024 15:47   DG Foot 2 Views Left Result Date: 03/29/2024 EXAM: 2 VIEW(S) XRAY OF THE LEFT FOOT 03/29/2024 12:27:37 PM COMPARISON: Left foot series dated 03/28/2024. CLINICAL HISTORY: 461500 Elective surgery [phone]. STAT intra-op image to rule out retained foreign object s/p broken scalpel. Call results STAT to OR18 at [phone]; Patient under anesthesia, surgeon waiting for results. FINDINGS: BONES AND JOINTS: Since the previous study, the patient has undergone amputation of the great toe at the level of the metatarsophalangeal joint. The fifth digit has also been resected at the level of the metatarsophalangeal joint. No acute fracture. No focal osseous lesion. No joint dislocation. SOFT TISSUES: The soft tissues are unremarkable. No foreign bodies evident. IMPRESSION: 1. No retained foreign object. 2. Status post amputation of the great toe and resection of the fifth digit at the level of the metatarsophalangeal joint. Electronically signed by: Evalene Coho MD 03/29/2024 12:36 PM EDT RP Workstation: [id number]        Scheduled Meds:  aspirin  EC  81 mg Oral Daily   atorvastatin   40 mg Oral Daily   citalopram   20 mg Oral Daily   insulin  aspart  0-15 Units Subcutaneous TID WC   insulin  glargine  35 Units Subcutaneous Daily   irbesartan   300 mg Oral Daily   pantoprazole   40 mg Oral Daily   Continuous Infusions:  ampicillin -sulbactam (UNASYN ) IV 3 g (03/31/24 0550)   vancomycin  750 mg (03/31/24 0331)          Sophie Mao, MD [facility] 03/31/2024, 7:43 AM

## 2024-03-31 NOTE — Plan of Care (Signed)

## 2024-03-31 NOTE — Progress Notes (Signed)
 Pharmacy Antibiotic Note  [name] is a 68 y.o. male admitted on 03/27/2024 with left great toeosteomyelitis.  Pharmacy has been consulted for Meropenem  dosing. Patient was on zosyn  and vancomycin  empiric therapy. He has a past history of potential cefepime  allergy. Surgical deep wound culture from 8/19 has resulted with pseudomonas aeruginosa, enterobacter species, and proteus mirabilis. He is being switched to meropenem  for empiric coverage of these pathogens while we await sensitivity reports.  Plan: Meropenem  1g IV every 8 hours  Height: 5' 8 (172.7 cm) Weight: 68.9 kg (152 lb) IBW/kg (Calculated) : 68.4  Temp (24hrs), Avg:97.9 F (36.6 C), Min:97.4 F (36.3 C), Max:98.4 F (36.9 C)  Recent Labs  Lab 03/27/24 2336 03/28/24 0011 03/28/24 0620 03/29/24 0428 03/31/24 0237  WBC 10.6*  --  10.3 8.6 7.2  CREATININE 0.82  --  0.68 0.73 0.76  LATICACIDVEN  --  1.5  --   --   --     Estimated Creatinine Clearance: 86.7 mL/min (by C-G formula based on SCr of 0.76 mg/dL).    Allergies  Allergen Reactions   Cefepime  Hives    Had allergic reaction to one of these 3 agents, unclear which one   Metoprolol  Hives    Had allergic reaction to one of these 3 agents, unclear which one  *Per RN, highly likely this is the cause of allergic rxn   Myrbetriq  [Mirabegron ] Hives    Had allergic reaction to one of these 3 agents, unclear which one    Antimicrobials this admission: Unasyn  8/18 >> 8/21 Zosyn  8/21 >> 8/21 Meropenem  8/21>>   Microbiology results: 8/17 BCx: no growth at 4 days 8/19 surgical deep wound: pseudomonas aeruginosa, enterobacter species, and proteus mirabilis  Thank you for allowing pharmacy to be involved with this patient's care.  Mendel Barter, PharmD PGY1 Clinical Pharmacist [facility]  03/31/2024 1:08 PM

## 2024-04-01 ENCOUNTER — Other Ambulatory Visit (HOSPITAL_COMMUNITY): Payer: Self-pay

## 2024-04-01 ENCOUNTER — Telehealth (HOSPITAL_COMMUNITY): Payer: Self-pay | Admitting: Pharmacy Technician

## 2024-04-01 ENCOUNTER — Telehealth: Admitting: *Deleted

## 2024-04-01 DIAGNOSIS — L03116 Cellulitis of left lower limb: Secondary | ICD-10-CM | POA: Diagnosis not present

## 2024-04-01 LAB — CULTURE, BLOOD (ROUTINE X 2)
Culture: NO GROWTH
Culture: NO GROWTH
Special Requests: ADEQUATE
Special Requests: ADEQUATE

## 2024-04-01 LAB — GLUCOSE, CAPILLARY
Glucose-Capillary: 183 mg/dL — ABNORMAL HIGH (ref 70–99)
Glucose-Capillary: 104 mg/dL — ABNORMAL HIGH (ref 70–99)
Glucose-Capillary: 174 mg/dL — ABNORMAL HIGH (ref 70–99)

## 2024-04-01 LAB — BASIC METABOLIC PANEL WITH GFR
Anion gap: 6 (ref 5–15)
BUN: 12 mg/dL (ref 8–23)
CO2: 28 mmol/L (ref 22–32)
Calcium: 8.4 mg/dL — ABNORMAL LOW (ref 8.9–10.3)
Chloride: 104 mmol/L (ref 98–111)
Creatinine, Ser: 0.87 mg/dL (ref 0.61–1.24)
GFR, Estimated: >60 mL/min (ref >60)
Glucose, Bld: 196 mg/dL — ABNORMAL HIGH (ref 70–99)
Potassium: 3.6 mmol/L (ref 3.5–5.1)
Sodium: 138 mmol/L (ref 135–145)

## 2024-04-01 LAB — MAGNESIUM: Magnesium: 1.7 mg/dL (ref 1.7–2.4)

## 2024-04-01 MED ORDER — OXYCODONE-ACETAMINOPHEN 5-325 MG PO TABS
1.0000 | ORAL_TABLET | Freq: Four times a day (QID) | ORAL | 0 refills | Status: DC | PRN
  Filled 2024-04-01: qty 20, 5d supply, fill #0

## 2024-04-01 MED ORDER — CIPROFLOXACIN HCL 750 MG PO TABS
750.0000 mg | ORAL_TABLET | Freq: Two times a day (BID) | ORAL | 0 refills | Status: AC
  Filled 2024-04-01: qty 56, 28d supply, fill #0

## 2024-04-01 MED ORDER — CLOPIDOGREL BISULFATE 75 MG PO TABS
75.0000 mg | ORAL_TABLET | Freq: Every day | ORAL | Status: DC
  Administered 2024-04-01: 75 mg via ORAL
  Filled 2024-04-01: qty 1

## 2024-04-01 NOTE — Telephone Encounter (Signed)
 Patient Product/process development scientist completed.    The patient is insured through [facility]. Patient has Medicare and is not eligible for a copay card, but may be able to apply for patient assistance or Medicare RX Payment Plan (Patient Must reach out to their plan, if eligible for payment plan), if available.    Ran test claim for ciprofloxacin  (Cipro ) 750 mg and the current 28 day co-pay is $0.00.   This test claim was processed through [facility]- copay amounts may vary at other pharmacies due to pharmacy/plan contracts, or as the patient moves through the different stages of their insurance plan.     Reyes Sharps, CPHT Pharmacy Technician III Certified Patient Advocate [facility] Patient Advocate Team Direct Number: [phone]  Fax: [phone]

## 2024-04-01 NOTE — Progress Notes (Addendum)
 [facility]    Date of Admission:  03/27/2024   Total days of antibiotics 6   ID: [name] is a 68 y.o. male with  great toe osteomyelitis s/p amputation, residual infection Principal Problem:   Cellulitis of foot, left Active Problems:   Essential hypertension   Bladder cancer (HCC)   PAD (peripheral artery disease) (HCC)   Insulin  dependent type 2 diabetes mellitus (HCC)   Ulcer of foot, left, with fat layer exposed (HCC)   Cellulitis   Gangrene of toe of left foot (HCC)   Osteomyelitis of toe of left foot (HCC)   Ulcer of left foot with necrosis of muscle (HCC)    Subjective: Afebrie. Wound is wrapped, notices some occasional shooting pain to his dorsum of foot  Medications:   aspirin  EC  81 mg Oral Daily   atorvastatin   40 mg Oral Daily   citalopram   20 mg Oral Daily   clopidogrel   75 mg Oral Daily   insulin  aspart  0-15 Units Subcutaneous TID WC   insulin  glargine  35 Units Subcutaneous Daily   irbesartan   300 mg Oral Daily   pantoprazole   40 mg Oral Daily    Objective: Vital signs in last 24 hours: Temp:  [97.7 F (36.5 C)-98.8 F (37.1 C)] 98.8 F (37.1 C) (08/22 1154) Pulse Rate:  [54-63] 54 (08/22 1154) Resp:  [18] 18 (08/21 1606) BP: (128-165)/(50-79) 133/50 (08/22 1154) SpO2:  [99 %-100 %] 100 % (08/22 1154) Physical Exam  Constitutional: He is oriented to person, place, and time. He appears well-developed and well-nourished. No distress.  HENT:  Mouth/Throat: Oropharynx is clear and moist. No oropharyngeal exudate.  Cardiovascular: Normal rate, regular rhythm and normal heart sounds. Exam reveals no gallop and no friction rub.  No murmur heard.  Pulmonary/Chest: Effort normal and breath sounds normal. No respiratory distress. He has no wheezes.  Zku:mphyu foot is wrapped Skin: Skin is warm and dry. No rash noted. No erythema.  Psychiatric: He has a normal mood and affect. His behavior is normal.    Lab  Results Recent Labs    03/31/24 0237 04/01/24 0242  WBC 7.2  --   HGB 11.9*  --   HCT 34.2*  --   NA 139 138  K 3.6 3.6  CL 102 104  CO2 28 28  BUN 11 12  CREATININE 0.76 0.87   Liver Panel No results for input(s): PROT, ALBUMIN, AST, ALT, ALKPHOS, BILITOT, BILIDIR, IBILI in the last 72 hours. Sedimentation Rate No results for input(s): ESRSEDRATE in the last 72 hours. C-Reactive Protein Recent Labs    03/31/24 0237  CRP 0.8    Microbiology:           Pseudomonas aeruginosa Proteus mirabilis Enterobacter cloacae      MIC MIC MIC    AMPICILLIN    <=2 SENSITIVE Sensitive      AMPICILLIN /SULBACTAM   <=2 SENSITIVE Sensitive      CEFAZOLIN  (NON-URINE)   4 INTERMEDI... Intermediate      CEFEPIME  4 SENSITIVE Sensitive <=0.12 SENS... Sensitive <=0.12 SENS... Sensitive    CEFTAZIDIME 2 SENSITIVE Sensitive        CEFTAZIDIME/AVIBACTAM 2 SENSITIVE... Sensitive        CEFTOLOZANE/TAZOBACTAM 1 SENSITIVE... Sensitive        CEFTRIAXONE   <=0.25 SENS... Sensitive      CIPROFLOXACIN  0.12 SENSIT... Sensitive <=0.06 SENS... Sensitive 0.5 INTERME... Intermediate    ERTAPENEM   <=0.12 SENS... Sensitive <=  0.12 SENS... Sensitive    GENTAMICIN    <=1 SENSITIVE Sensitive <=1 SENSITIVE Sensitive    IMIPENEM 2 SENSITIVE Sensitive        MEROPENEM  <=0.25 SENS... Sensitive <=0.25 SENS... Sensitive <=0.25 SENS... Sensitive    PIP/TAZO  Sensitive 1  Sensitive 2  Sensitive 3    TOBRAMYCIN <=1 SENSITIVE Sensitive        TRIMETH /SULFA    <=20 SENSIT... Sensitive >=320 RESIS... Resistant     Studies/Results: No results found.   Assessment/Plan: Right great toe osteomyelitis s/p amputation, mop up with abtx for residual disease = will do high dose cipro  750mg  po bid x 4 wk. Will send isolate -enterobacter - for omadacycline to see if can get approved. Plan to see back in the ID clinic in 2-wk to see if wound is improving and consider adding omadacycline. It is intermediate  sensitivity to FQ but we will push for higher doses in the meantime  Recommend he follows wound care instructions and follow up with dr standiford  Poor candidate for picc line give lack of water/electricity access  evaluation of this patient requires complex antimicrobial therapy evaluation and counseling and isolation needs for disease transmission risk assessment and mitigation.    I have personally spent 35 minutes involved in face-to-face and non-face-to-face activities for this patient on the day of the visit. Professional time spent includes the following activities: Preparing to see the patient (review of tests), Performing a medically appropriate examination and/or evaluation , Ordering medications/tests/procedures, referring and communicating with other health care professionals, Documenting clinical information in the EMR, Independently interpreting results (not separately reported), Communicating results to the patient and Care coordination (not separately reported).    [facility] Pager: [phone]  04/01/2024, 1:54 PM

## 2024-04-01 NOTE — Progress Notes (Signed)
 PROGRESS NOTE    [name]  FMW:[id number] DOB: 21-Jan-1956 DOA: 03/27/2024 PCP: Norleen Lynwood LELON, MD   Brief Narrative:  68 y.o. male with a history of PAD, osteomyelitis s/p toe amputation, diabetes mellitus, hypertension, bladder cancer, paroxysmal atrial fibrillation, GERD, depression presented with increased foot swelling and discharge with evidence of cellulitis and infected foot wound.  He was started on broad-spectrum antibiotics and podiatry was consulted.  Vascular surgery was also consulted.  ABI was significant for right moderate disease and abnormal left toe brachial index as well.  He underwent surgical intervention by podiatry on 03/29/2024.  ID was subsequently consulted on 03/30/2024.  Assessment & Plan:   Diabetic left foot infection along with cellulitis - MRI of left foot showed no evidence of osteomyelitis but did support cellulitis. -ABI significant for right moderate disease. LE with abnormal left toe-brachial index as well. Left arterial duplex significant for moderate stenosis (50-74%) of the distal CFA, mild stenosis (30-49%) in the distal ATA and a patent SFA to above knee popliteal artery stent.  Vascular surgery recommended no intervention -underwent surgical intervention by podiatry on 03/29/2024 with amputation of left great toe at MPJ level along with I&D with graft for graft of ulceration plantar first MPJ.  Follow-up OR cultures: Currently growing Pseudomonas, Enterobacter, Proteus.  Follow sensitivities.  ID following: Antibiotics have been switched to meropenem  alone by ID on 03/31/2024.  Podiatry signed off on 03/30/2024.  Outpatient follow-up with podiatry.  Wound care as per podiatry.  Peripheral artery disease - Outpatient follow-up with vascular surgery.  Plavix  on hold.  Will resume Plavix  as podiatry has given clearance for the same.  Continue aspirin .  Diabetes mellitus type 2 with hyperglycemia -A1c 11.2.  Continue long-acting insulin  along with CBGs with  SSI.    Hypertension--blood pressure stable.  Continue irbesartan   Hyperlipidemia -Continue statin  Depression -Continue Celexa   History of bladder cancer - Noted.  Outpatient follow-up with urology  GERD -Continue Protonix   Hypokalemia -Resolved  Hypomagnesemia - Resolved  History of paroxysmal A-fib -Not on rate control, rhythm control or anticoagulation.  Currently rate controlled     DVT prophylaxis: SCDs Code Status: Full Family Communication: None at bedside Disposition Plan: Status is: Inpatient Remains inpatient appropriate because: Of severity of illness.  Need for IV antibiotics    Consultants: Vascular surgery/podiatry.  ID  Procedures: As above  Antimicrobials:  Anti-infectives (From admission, onward)    Start     Dose/Rate Route Frequency Ordered Stop   03/31/24 1400  meropenem  (MERREM ) 1 g in sodium chloride  0.9 % 100 mL IVPB        1 g 200 mL/hr over 30 Minutes Intravenous Every 8 hours 03/31/24 0936     03/31/24 0845  piperacillin -tazobactam (ZOSYN ) IVPB 3.375 g  Status:  Discontinued        3.375 g 12.5 mL/hr over 240 Minutes Intravenous Every 8 hours 03/31/24 0750 03/31/24 0936   03/28/24 1600  vancomycin  (VANCOREADY) IVPB 750 mg/150 mL  Status:  Discontinued        750 mg 150 mL/hr over 60 Minutes Intravenous Every 12 hours 03/28/24 0610 03/31/24 0936   03/28/24 0600  Ampicillin -Sulbactam (UNASYN ) 3 g in sodium chloride  0.9 % 100 mL IVPB  Status:  Discontinued        3 g 200 mL/hr over 30 Minutes Intravenous Every 6 hours 03/28/24 0606 03/31/24 0750   03/28/24 0215  vancomycin  (VANCOREADY) IVPB 1750 mg/350 mL  1,750 mg 175 mL/hr over 120 Minutes Intravenous  Once 03/28/24 9786 03/28/24 0533         Subjective: Patient seen and examined at bedside.  No fever, vomiting, abdominal pain reported.   Objective: Vitals:   03/31/24 2038 04/01/24 0025 04/01/24 0539 04/01/24 0744  BP: (!) 159/79 (!) 165/65 (!) 128/50 (!) 161/59   Pulse: 63 63 (!) 58 60  Resp:      Temp: 98.3 F (36.8 C) 98 F (36.7 C) 97.8 F (36.6 C) 97.7 F (36.5 C)  TempSrc:    Oral  SpO2: 100% 99% 100% 100%  Weight:      Height:        Intake/Output Summary (Last 24 hours) at 04/01/2024 0747 Last data filed at 04/01/2024 0400 Gross per 24 hour  Intake 300 ml  Output 550 ml  Net -250 ml   Filed Weights   03/27/24 2328 03/29/24 1118  Weight: 69 kg 68.9 kg    Examination:  General: No acute distress.  Remains on room air. ENT/neck: No neck masses or elevated JVD noted  respiratory: Bilateral decreased breath sounds at bases with scattered crackles CVS: Mild intermittent bradycardia present; S1 and S2 heard Abdominal: Soft, nontender, mildly; no organomegaly, bowel sounds are heard normally Extremities: Left foot dressing present CNS: Awake; remains slow to respond.  No focal neurologic deficit.  Able to move extremities Lymph: No obvious palpable lymphadenopathy Skin: No obvious rashes/petechiae  psych: No signs of agitation.  Affect is flat.    Data Reviewed: I have personally reviewed following labs and imaging studies  CBC: Recent Labs  Lab 03/27/24 2336 03/28/24 0620 03/29/24 0428 03/31/24 0237  WBC 10.6* 10.3 8.6 7.2  NEUTROABS 6.6 6.7  --  4.0  HGB 13.4 13.2 12.7* 11.9*  HCT 38.8* 37.9* 36.6* 34.2*  MCV 94.9 92.7 92.4 91.9  PLT 220 200 196 184   Basic Metabolic Panel: Recent Labs  Lab 03/27/24 2336 03/28/24 0620 03/29/24 0428 03/31/24 0237 04/01/24 0242  NA 136 137 143 139 138  K 3.3* 3.3* 3.6 3.6 3.6  CL 100 107 107 102 104  CO2 27 27 27 28 28   GLUCOSE 309* 135* 91 213* 196*  BUN 9 9 8 11 12   CREATININE 0.82 0.68 0.73 0.76 0.87  CALCIUM  8.8* 8.6* 8.7* 8.2* 8.4*  MG  --   --   --  1.1* 1.7   GFR: Estimated Creatinine Clearance: 79.7 mL/min (by C-G formula based on SCr of 0.87 mg/dL). Liver Function Tests: Recent Labs  Lab 03/27/24 2336 03/28/24 0620  AST 15 14*  ALT 27 21  ALKPHOS 77 63   BILITOT 0.7 1.2  PROT 6.5 5.7*  ALBUMIN 2.9* 2.4*   No results for input(s): LIPASE, AMYLASE in the last 168 hours. No results for input(s): AMMONIA in the last 168 hours. Coagulation Profile: Recent Labs  Lab 03/27/24 2336  INR 1.0   Cardiac Enzymes: No results for input(s): CKTOTAL, CKMB, CKMBINDEX, TROPONINI in the last 168 hours. BNP (last 3 results) No results for input(s): PROBNP in the last 8760 hours. HbA1C: No results for input(s): HGBA1C in the last 72 hours. CBG: Recent Labs  Lab 03/31/24 0536 03/31/24 0758 03/31/24 1115 03/31/24 1549 04/01/24 0540  GLUCAP 136* 188* 179* 158* 183*   Lipid Profile: No results for input(s): CHOL, HDL, LDLCALC, TRIG, CHOLHDL, LDLDIRECT in the last 72 hours. Thyroid  Function Tests: No results for input(s): TSH, T4TOTAL, FREET4, T3FREE, THYROIDAB in the last 72 hours. Anemia  Panel: No results for input(s): VITAMINB12, FOLATE, FERRITIN, TIBC, IRON, RETICCTPCT in the last 72 hours. Sepsis Labs: Recent Labs  Lab 03/28/24 0011  LATICACIDVEN 1.5    Recent Results (from the past 240 hours)  Culture, blood (Routine x 2)     Status: None (Preliminary result)   Collection Time: 03/27/24 11:36 PM   Specimen: BLOOD  Result Value Ref Range Status   Specimen Description BLOOD BLOOD RIGHT ARM  Final   Special Requests   Final    BOTTLES DRAWN AEROBIC AND ANAEROBIC Blood Culture adequate volume   Culture   Final    NO GROWTH 4 DAYS Performed at [facility], [address], [place], [place] [address]    Report Status PENDING  Incomplete  Culture, blood (Routine x 2)     Status: None (Preliminary result)   Collection Time: 03/27/24 11:36 PM   Specimen: BLOOD  Result Value Ref Range Status   Specimen Description BLOOD BLOOD LEFT ARM  Final   Special Requests   Final    BOTTLES DRAWN AEROBIC ONLY Blood Culture adequate volume   Culture   Final    NO GROWTH 4 DAYS Performed  at [facility], [address] [place], [place] [address]    Report Status PENDING  Incomplete  Aerobic/Anaerobic Culture w Gram Stain (surgical/deep wound)     Status: None (Preliminary result)   Collection Time: 03/29/24 12:26 PM   Specimen: PATH Bone resection; Tissue  Result Value Ref Range Status   Specimen Description TISSUE  Final   Special Requests NONE  Final   Gram Stain   Final    RARE WBC PRESENT, PREDOMINANTLY PMN RARE GRAM NEGATIVE RODS    Culture   Final    FEW PSEUDOMONAS AERUGINOSA FEW ENTEROBACTER SPECIES RARE PROTEUS MIRABILIS SUSCEPTIBILITIES TO FOLLOW HOLDING FOR POSSIBLE ANAEROBE Performed at [facility], [address], [place], [place] [address]    Report Status PENDING  Incomplete         Radiology Studies: No results found.       Scheduled Meds:  aspirin  EC  81 mg Oral Daily   atorvastatin   40 mg Oral Daily   citalopram   20 mg Oral Daily   insulin  aspart  0-15 Units Subcutaneous TID WC   insulin  glargine  35 Units Subcutaneous Daily   irbesartan   300 mg Oral Daily   pantoprazole   40 mg Oral Daily   Continuous Infusions:  meropenem  (MERREM ) IV 1 g (04/01/24 0532)          Sophie Mao, MD [facility] 04/01/2024, 7:47 AM

## 2024-04-01 NOTE — TOC Progression Note (Signed)
 Transition of Care [facility]) - Progression Note    Patient Details  Name: [name] MRN: [id number] Date of Birth: [birth year]  Transition of Care [facility]) CM/SW Contact  Rosaline JONELLE Joe, RN Phone Number: 04/01/2024, 2:31 PM  Clinical Narrative:    CM spoke with bedside nursing and patient plans to return home today.  Discharge medications were sent to [facility] pharmacy and bedside nursing will provide to the patient before he is sent home.  I asked hospitalist to include instructions regarding patient's wound care for home per Podiatry that states -Dressings left clean and dry. Do not remove dressings .  Patient requesting transportation to home.  [employer] voucher provided and given to Consulting civil engineer.                     Expected Discharge Plan and Services         Expected Discharge Date: 04/01/24                                     Social Drivers of Health (SDOH) Interventions SDOH Screenings   Food Insecurity: Food Insecurity Present (03/28/2024)  Housing: High Risk (03/28/2024)  Transportation Needs: Unmet Transportation Needs (03/28/2024)  Utilities: At Risk (03/28/2024)  Alcohol Screen: Low Risk  (06/10/2023)  Depression (PHQ2-9): Low Risk  (03/18/2024)  Financial Resource Strain: High Risk (03/11/2024)  Physical Activity: Inactive (06/10/2023)  Social Connections: Socially Isolated (03/28/2024)  Stress: Stress Concern Present (03/11/2024)  Tobacco Use: High Risk (03/29/2024)  Health Literacy: Inadequate Health Literacy (02/17/2024)    Readmission Risk Interventions    03/30/2024    2:55 PM 03/09/2024    3:21 PM 12/30/2023    1:17 PM  Readmission Risk Prevention Plan  Post Dischage Appt   Complete  Medication Screening   Complete  Transportation Screening Complete Complete Complete  PCP or Specialist Appt within 5-7 Days  Complete   PCP or Specialist Appt within 3-5 Days Complete    Home Care Screening  Complete   Medication Review (RN CM)  Complete    HRI or Home Care Consult Complete    Social Work Consult for Recovery Care Planning/Counseling Complete    Palliative Care Screening Complete    Medication Review Oceanographer) Referral to Pharmacy

## 2024-04-01 NOTE — Discharge Summary (Addendum)
 Physician Discharge Summary  [name] NO FMW:[id number] DOB: [birth year] DOA: 03/27/2024  PCP: Norleen Lynwood LELON, MD  Admit date: 03/27/2024 Discharge date: 04/01/2024  Admitted From: Home Disposition: Home  Recommendations for Outpatient Follow-up:  Follow up with PCP in 1 week with repeat CBC/BMP Outpatient follow-up with podiatry.  Discharge wound care and pain management as per podiatry recommendations Outpatient follow-up with ID  Follow up in ED if symptoms worsen or new appear   Home Health: Home health PT Equipment/Devices: None  Discharge Condition: Stable CODE STATUS: Full Diet recommendation: Heart healthy/carb modified  Brief/Interim Summary: 68 y.o. male with a history of PAD, osteomyelitis s/p toe amputation, diabetes mellitus, hypertension, bladder cancer, paroxysmal atrial fibrillation, GERD, depression presented with increased foot swelling and discharge with evidence of cellulitis and infected foot wound. He was started on broad-spectrum antibiotics and podiatry was consulted. Vascular surgery was also consulted. ABI was significant for right moderate disease and abnormal left toe brachial index as well. He underwent surgical intervention by podiatry on 03/29/2024. ID was subsequently consulted on 03/30/2024.  Subsequently, ID has switched him to oral ciprofloxacin  and cleared him for discharge.  Discharge patient home today with outpatient follow-up with PCP, podiatry, ID.  Discharge Diagnoses:   Diabetic left foot infection along with cellulitis - MRI of left foot showed no evidence of osteomyelitis but did support cellulitis. -ABI significant for right moderate disease. LE with abnormal left toe-brachial index as well. Left arterial duplex significant for moderate stenosis (50-74%) of the distal CFA, mild stenosis (30-49%) in the distal ATA and a patent SFA to above knee popliteal artery stent.  Vascular surgery recommended no intervention -underwent surgical intervention  by podiatry on 03/29/2024 with amputation of left great toe at MPJ level along with I&D with graft for graft of ulceration plantar first MPJ. OR cultures growing Pseudomonas, Enterobacter, Proteus.   ID following: Antibiotics have been switched to meropenem  alone by ID on 03/31/2024.  Podiatry signed off on 03/30/2024.  Outpatient follow-up with podiatry.  Wound care as per podiatry. - Subsequently, ID has switched him to oral ciprofloxacin  750 mg twice a day for 4 weeks and cleared him for discharge.  Discharge patient home today with outpatient follow-up with PCP, podiatry, ID.   Peripheral artery disease - Outpatient follow-up with vascular surgery.  Plavix  has been resumed.  Continue aspirin  and Plavix  on discharge.   Diabetes mellitus type 2 with hyperglycemia -A1c 11.2.  Continue long-acting insulin .  Comply with insulin .  Carb modified diet   hypertension--blood pressure stable.  Continue irbesartan    Hyperlipidemia -Continue statin   Depression -Continue Celexa    History of bladder cancer - Noted.  Outpatient follow-up with urology   GERD -Continue Protonix    Hypokalemia -Resolved   Hypomagnesemia - Resolved   History of paroxysmal A-fib -Not on rate control, rhythm control or anticoagulation.  Currently rate controlled  Discharge Instructions  Discharge Instructions     Diet - low sodium heart healthy   Complete by: As directed    Diet Carb Modified   Complete by: As directed    Discharge wound care:   Complete by: As directed    As per podiatry recommendations   Increase activity slowly   Complete by: As directed       Allergies as of 04/01/2024       Reactions   Cefepime  Hives   Had allergic reaction to one of these 3 agents, unclear which one   Metoprolol  Hives   Had allergic reaction  to one of these 3 agents, unclear which one *Per RN, highly likely this is the cause of allergic rxn   Myrbetriq  [mirabegron ] Hives   Had allergic reaction to one of  these 3 agents, unclear which one        Medication List     STOP taking these medications    amoxicillin -clavulanate 875-125 MG tablet Commonly known as: AUGMENTIN        TAKE these medications    aspirin  EC 81 MG tablet Take 1 tablet (81 mg total) by mouth daily. Swallow whole.   atorvastatin  40 MG tablet Commonly known as: LIPITOR  TAKE 1 TABLET BY MOUTH ONCE DAILY   ciprofloxacin  750 MG tablet Commonly known as: CIPRO  Take 1 tablet (750 mg total) by mouth 2 (two) times daily for 28 days.   citalopram  20 MG tablet Commonly known as: CELEXA  Take 1 tablet (20 mg total) by mouth daily.   clopidogrel  75 MG tablet Commonly known as: Plavix  Take 1 tablet (75 mg total) by mouth daily.   irbesartan  300 MG tablet Commonly known as: AVAPRO  Take 1 tablet (300 mg total) by mouth daily.   loratadine  10 MG tablet Commonly known as: CLARITIN  Take 10 mg by mouth daily as needed.   pantoprazole  40 MG tablet Commonly known as: PROTONIX  Take 1 tablet (40 mg total) by mouth daily.   Toujeo  Max SoloStar 300 UNIT/ML Solostar Pen Generic drug: insulin  glargine (2 Unit Dial ) Inject 35 Units into the skin every morning.               Discharge Care Instructions  (From admission, onward)           Start     Ordered   04/01/24 0000  Discharge wound care:       Comments: As per podiatry recommendations: Dressings left clean and dry. Do not remove dressings   04/01/24 1433              Follow-up Information     Norleen Lynwood ORN, MD. Schedule an appointment as soon as possible for a visit in 1 week(s).   Specialties: Internal Medicine, Radiology Contact information: [address] [place] [address] [phone]         Malvin Marsa FALCON, DPM. Schedule an appointment as soon as possible for a visit in 1 week(s).   Specialty: Actuary information: [address] [place] [address] [phone]                 Allergies  Allergen Reactions   Cefepime  Hives    Had allergic reaction to one of these 3 agents, unclear which one   Metoprolol  Hives    Had allergic reaction to one of these 3 agents, unclear which one  *Per RN, highly likely this is the cause of allergic rxn   Myrbetriq  [Mirabegron ] Hives    Had allergic reaction to one of these 3 agents, unclear which one    Consultations: Podiatry/ID   Procedures/Studies: DG Foot 2 Views Left Result Date: 03/29/2024 CLINICAL DATA:  Postoperative state EXAM: LEFT FOOT - 2 VIEW COMPARISON:  foot radiograph March 29, 2024 12 : 24 FINDINGS: Left great toe amputation. Status post irrigation and debridement of left foot wound with expected postsurgical changes and surgical clips . The fifth digit has also been resected at the level of the metatarsophalangeal joint. No acute fracture. No No new osseous lesion. Remainder of the soft tissue structures are within normal limits Impression  Postsurgical changes at the region of left great toe amputation site. Electronically Signed   By: Megan  Zare M.D.   On: 03/29/2024 15:47   DG Foot 2 Views Left Result Date: 03/29/2024 EXAM: 2 VIEW(S) XRAY OF THE LEFT FOOT 03/29/2024 12:27:37 PM COMPARISON: Left foot series dated 03/28/2024. CLINICAL HISTORY: 461500 Elective surgery [phone]. STAT intra-op image to rule out retained foreign object s/p broken scalpel. Call results STAT to OR18 at [phone]; Patient under anesthesia, surgeon waiting for results. FINDINGS: BONES AND JOINTS: Since the previous study, the patient has undergone amputation of the great toe at the level of the metatarsophalangeal joint. The fifth digit has also been resected at the level of the metatarsophalangeal joint. No acute fracture. No focal osseous lesion. No joint dislocation. SOFT TISSUES: The soft tissues are unremarkable. No foreign bodies evident. IMPRESSION: 1. No retained foreign object. 2. Status post amputation of the great toe  and resection of the fifth digit at the level of the metatarsophalangeal joint. Electronically signed by: Evalene Coho MD 03/29/2024 12:36 PM EDT RP Workstation: [id number]   VAS US  LOWER EXTREMITY ARTERIAL DUPLEX Result Date: 03/28/2024 LOWER EXTREMITY ARTERIAL DUPLEX STUDY Patient Name:  [name]  Date of Exam:   03/28/2024 Medical Rec #: [id number]      Accession #:    [id number] Date of Birth: 21-Feb-1956      Patient Gender: M Patient Age:   70 years Exam Location:  [facility] Procedure:      VAS US  LOWER EXTREMITY ARTERIAL DUPLEX Referring Phys: FONDA ROBINS --------------------------------------------------------------------------------  Indications: Ulceration, gangrene, and peripheral artery disease. High Risk Factors: Hypertension, hyperlipidemia, Diabetes, current smoker,                    coronary artery disease.  Vascular Interventions: Left SFA stent placed 01/01/24, stent occluded and                         realigned with a 6 mm X 150 mm Eluvia stent 02/15/24. Left                         partial great toe amputation and left 5th digit                         amputation. Current ABI:            R: 0.61 L: 1.12 Comparison Study: Prior Left LEA done 03/01/24 Performing Technologist: Alberta Lis RVS  Examination Guidelines: A complete evaluation includes B-mode imaging, spectral Doppler, color Doppler, and power Doppler as needed of all accessible portions of each vessel. Bilateral testing is considered an integral part of a complete examination. Limited examinations for reoccurring indications may be performed as noted.  +-----------+--------+-----+---------------+----------+--------+ LEFT       PSV cm/sRatioStenosis       Waveform  Comments +-----------+--------+-----+---------------+----------+--------+ CFA Prox   156                         biphasic           +-----------+--------+-----+---------------+----------+--------+ CFA Distal 257          50-74%  stenosisbiphasic           +-----------+--------+-----+---------------+----------+--------+ DFA        92  biphasic           +-----------+--------+-----+---------------+----------+--------+ SFA Prox   115                         biphasic           +-----------+--------+-----+---------------+----------+--------+ SFA Mid    129                         biphasic           +-----------+--------+-----+---------------+----------+--------+ SFA Distal 122                         biphasic           +-----------+--------+-----+---------------+----------+--------+ POP Prox   104                         biphasic           +-----------+--------+-----+---------------+----------+--------+ POP Distal 107                         biphasic           +-----------+--------+-----+---------------+----------+--------+ ATA Prox   114                         biphasic           +-----------+--------+-----+---------------+----------+--------+ ATA Mid    104                         biphasic           +-----------+--------+-----+---------------+----------+--------+ ATA Distal 159          30-49% stenosisbiphasic           +-----------+--------+-----+---------------+----------+--------+ PTA Prox   138                         biphasic           +-----------+--------+-----+---------------+----------+--------+ PTA Mid    122                         biphasic           +-----------+--------+-----+---------------+----------+--------+ PTA Distal 81                          biphasic           +-----------+--------+-----+---------------+----------+--------+ PERO Prox  43                          monophasic         +-----------+--------+-----+---------------+----------+--------+ PERO Mid   57                          monophasic         +-----------+--------+-----+---------------+----------+--------+ PERO Distal50                           monophasic         +-----------+--------+-----+---------------+----------+--------+  LEFT Stent(s): +---------------------------+--------+--------+--------+--------+ SFA to Above Knee PoplitealPSV cm/sStenosisWaveformComments +---------------------------+--------+--------+--------+--------+ Prox to Stent              129  biphasic         +---------------------------+--------+--------+--------+--------+ Proximal Stent             139             biphasic         +---------------------------+--------+--------+--------+--------+ Mid Stent                  159             biphasic         +---------------------------+--------+--------+--------+--------+ Distal Stent               122             biphasic         +---------------------------+--------+--------+--------+--------+ Distal to Stent            100             biphasic         +---------------------------+--------+--------+--------+--------+    Summary: Left: 50-74% stenosis noted in the distal CFA and 30-49% stenosis noted in the distal ATA. The SFA to above knee popliteal artery stent is patent.  See table(s) above for measurements and observations. Electronically signed by Fonda Rim on 03/28/2024 at 4:45:53 PM.    Final    VAS US  ABI WITH/WO TBI Result Date: 03/28/2024  LOWER EXTREMITY DOPPLER STUDY Patient Name:  [name]  Date of Exam:   03/28/2024 Medical Rec #: [id number]      Accession #:    [id number] Date of Birth: 01-31-1956      Patient Gender: M Patient Age:   39 years Exam Location:  [facility] Procedure:      VAS US  ABI WITH/WO TBI Referring Phys: REDIA CLEAVER --------------------------------------------------------------------------------  Indications: Ulceration, gangrene, and peripheral artery disease. Partial left              great toe amputation 02/29/24 and left fifth toe amputation 01/02/24 High Risk Factors: Hypertension, hyperlipidemia, Diabetes, current  smoker,                    coronary artery disease.  Vascular Interventions: Left SFA stent placed 01/01/24, stent occluded and was                         realigned with a 6 mm X 150 mm Eluvia stent 02/15/24 Comparison Study: Prior ABI done 03/01/2024 Performing Technologist: Rachel Pellet RVS  Examination Guidelines: A complete evaluation includes at minimum, Doppler waveform signals and systolic blood pressure reading at the level of bilateral brachial, anterior tibial, and posterior tibial arteries, when vessel segments are accessible. Bilateral testing is considered an integral part of a complete examination. Photoelectric Plethysmograph (PPG) waveforms and toe systolic pressure readings are included as required and additional duplex testing as needed. Limited examinations for reoccurring indications may be performed as noted.  ABI Findings: +---------+------------------+-----+----------+--------+ Right    Rt Pressure (mmHg)IndexWaveform  Comment  +---------+------------------+-----+----------+--------+ Brachial 152                    triphasic          +---------+------------------+-----+----------+--------+ PTA      100               0.61 monophasic         +---------+------------------+-----+----------+--------+ DP       91                0.55  monophasic         +---------+------------------+-----+----------+--------+ Great Toe59                0.36 Abnormal           +---------+------------------+-----+----------+--------+ +---------+------------------+-----+---------+-------------------------------+ Left     Lt Pressure (mmHg)IndexWaveform Comment                         +---------+------------------+-----+---------+-------------------------------+ Brachial 164                    triphasic                                +---------+------------------+-----+---------+-------------------------------+ PTA      184               1.12 biphasic                                  +---------+------------------+-----+---------+-------------------------------+ DP       168               1.02 biphasic                                 +---------+------------------+-----+---------+-------------------------------+ Great Toe0                 0.00          great toe amp, 2nd toe assessed +---------+------------------+-----+---------+-------------------------------+ +-------+-----------+-----------------+------------+-------------+ ABI/TBIToday's ABIToday's TBI      Previous ABIPrevious TBI  +-------+-----------+-----------------+------------+-------------+ Right  0.61       0.36             0.55        dampened flow +-------+-----------+-----------------+------------+-------------+ Left   1.12       no flow 2nd digit0.88        amputation    +-------+-----------+-----------------+------------+-------------+ Right ABIs and TBIs appear increased compared to prior study on 03/01/24. Left ABIs appear increased compared to prior study on 03/01/24.  Summary: Right: Resting right ankle-brachial index indicates moderate right lower extremity arterial disease. The right toe-brachial index is abnormal. Left: Resting left ankle-brachial index is within normal range. The left toe-brachial index is abnormal. *See table(s) above for measurements and observations.  Electronically signed by Fonda Rim on 03/28/2024 at 4:45:16 PM.    Final    MR FOOT LEFT WO CONTRAST Result Date: 03/28/2024 CLINICAL DATA:  Osteomyelitis suspected. EXAM: MRI OF THE LEFT FOOT WITHOUT CONTRAST TECHNIQUE: Multiplanar, multisequence MR imaging of the left forefoot was performed. No intravenous contrast was administered. COMPARISON:  Radiographs 03/28/2024 and 02/29/2024.  MRI 12/30/2023. FINDINGS: Bones/Joint/Cartilage Stable postsurgical changes from previous amputation of the small toe and more recent amputation through the head of the 1st proximal phalanx. There is nonspecific mild irregularity  along the amputation margin of the 1st proximal phalanx with a small amount of adjacent fluid. No progressive cortical destruction identified. The 2nd, 3rd and 4th toes appear intact. Minimal T2 hyperintensity within the 5th metatarsal head without cortical destruction or T1 marrow signal abnormality. Bipartite tibial sesamoid of the 1st metatarsal with decreased T2 marrow signal compared with previous MRI. No significant forefoot joint effusions. The alignment is normal at the Lisfranc joint. Similar mild chronic degenerative changes involving the medial cuneiform. Ligaments Intact Lisfranc ligament. The collateral ligaments of  the remaining metatarsophalangeal joints appear intact. Muscles and Tendons Generalized muscular edema without focal fluid collection or significant tenosynovitis. 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 Soft tissues Compared with the previous MRI, interval increased diffuse subcutaneous edema. Apparent new skin ulceration in the medial plantar aspect of the forefoot with underlying ill-defined soft tissue fluid. No organized fluid collection, foreign body or definite soft tissue emphysema. IMPRESSION: 1. Interval increased diffuse subcutaneous edema with apparent new skin ulceration in the medial plantar aspect of the forefoot, suspicious for soft tissue infection. No organized fluid collection, foreign body or definite soft tissue emphysema. 2. Stable postsurgical changes from previous amputation of the small toe and more recent amputation through the head of the 1st proximal phalanx. There is nonspecific mild irregularity along the amputation margin of the 1st proximal phalanx with a small amount of adjacent fluid. No progressive cortical destruction identified to strongly suggest recurrent osteomyelitis. 3. Minimal T2 hyperintensity within the 5th metatarsal head without cortical destruction or T1 marrow signal abnormality, likely reactive.  4. 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 Electronically Signed   By: Elsie Perone M.D.   On: 03/28/2024 10:08   DG Foot Complete Left Result Date: 03/28/2024 CLINICAL DATA:  Multiple amputations.  Pain.  Ulcers. EXAM: LEFT FOOT - COMPLETE 3+ VIEW COMPARISON:  02/29/2024 FINDINGS: Prior great toe and 5th toe amputation. No acute bone destruction to suggest acute osteomyelitis. No acute bony abnormality. Specifically, no fracture, subluxation, or dislocation. No soft tissue gas. IMPRESSION: No acute bony abnormality. Electronically Signed   By: Franky Crease M.D.   On: 03/28/2024 01:56   CT Angio Chest PE W and/or Wo Contrast Result Date: 03/09/2024 CLINICAL DATA:  Shortness of breath EXAM: CT ANGIOGRAPHY CHEST WITH CONTRAST TECHNIQUE: Multidetector CT imaging of the chest was performed using the standard protocol during bolus administration of intravenous contrast. Multiplanar CT image reconstructions and MIPs were obtained to evaluate the vascular anatomy. RADIATION DOSE REDUCTION: This exam was performed according to the departmental dose-optimization program which includes automated exposure control, adjustment of the mA and/or kV according to patient size and/or use of iterative reconstruction technique. CONTRAST:  75mL OMNIPAQUE  IOHEXOL  350 MG/ML SOLN COMPARISON:  Chest x-ray from earlier in the same day. CT from 06/05/2019 FINDINGS: Cardiovascular: Atherosclerotic calcifications of the thoracic aorta are seen. No aneurysmal dilatation is noted. The pulmonary artery shows a normal branching pattern bilaterally. No intraluminal filling defect to suggest pulmonary embolism is seen. Coronary calcifications are noted. No cardiac enlargement is seen. Mediastinum/Nodes: Thoracic inlet is within normal limits. Scattered small mediastinal nodes are seen. One dominant node is noted in the precarinal region measuring up to 15 mm. The esophagus as visualized is within  normal limits. Lungs/Pleura: Lungs are well aerated bilaterally. No focal infiltrate or sizable effusion is seen. No pneumothorax is noted. Mild emphysematous changes are noted. Small less than 5 mm parenchymal nodules are noted in the lower lobes bilaterally. No sizable parenchymal nodule is seen. Upper Abdomen: Visualized upper abdomen shows no acute abnormality. Musculoskeletal: Degenerative changes of the thoracic spine are noted. No acute rib abnormality is noted. Review of the MIP images confirms the above findings. IMPRESSION: No evidence of pulmonary emboli. Small parenchymal nodules in the lower lobes bilaterally. No follow-up needed if patient is low-risk (and has no known or suspected primary neoplasm). Non-contrast chest CT can be considered in 12 months if patient is high-risk. This recommendation follows the consensus statement: Guidelines  for Management of Incidental Pulmonary Nodules Detected on CT Images: From the Fleischner Society 2017; Radiology 2017; [phone]. Scattered mediastinal and hilar nodes. The largest of these lies in the precarinal region. These may simply be reactive in nature. Short-term follow-up in 3-6 months is recommended to assess for stability/resolution. Aortic Atherosclerosis (ICD10-I70.0) and Emphysema (ICD10-J43.9). Electronically Signed   By: Oneil Devonshire M.D.   On: 03/09/2024 03:36   DG Chest 2 View Result Date: 03/09/2024 CLINICAL DATA:  Shortness of breath EXAM: CHEST - 2 VIEW COMPARISON:  01/13/2023 FINDINGS: Mild hyperinflation. Heart and mediastinal contours are within normal limits. No focal opacities or effusions. No acute bony abnormality. IMPRESSION: Hyperinflation.  No active cardiopulmonary disease. Electronically Signed   By: Franky Crease M.D.   On: 03/09/2024 00:34      Subjective: Patient seen and examined at bedside.  No fever, vomiting, abdominal pain reported.  Wants to go home today.  Discharge Exam: Vitals:   04/01/24 1143 04/01/24 1154   BP: (!) 145/51 (!) 133/50  Pulse: (!) 55 (!) 54  Resp:    Temp: 97.8 F (36.6 C) 98.8 F (37.1 C)  SpO2: 100% 100%     General: No acute distress.  Remains on room air. ENT/neck: No neck masses or elevated JVD noted  respiratory: Bilateral decreased breath sounds at bases with scattered crackles CVS: Mild intermittent bradycardia present; S1 and S2 heard Abdominal: Soft, nontender, mildly; no organomegaly, bowel sounds are heard normally Extremities: Left foot dressing present CNS: Awake; remains slow to respond.  No focal neurologic deficit.  Able to move extremities Lymph: No obvious palpable lymphadenopathy Skin: No obvious rashes/petechiae  psych: No signs of agitation.  Affect is flat.    The results of significant diagnostics from this hospitalization (including imaging, microbiology, ancillary and laboratory) are listed below for reference.     Microbiology: Recent Results (from the past 240 hours)  Culture, blood (Routine x 2)     Status: None   Collection Time: 03/27/24 11:36 PM   Specimen: BLOOD  Result Value Ref Range Status   Specimen Description BLOOD BLOOD RIGHT ARM  Final   Special Requests   Final    BOTTLES DRAWN AEROBIC AND ANAEROBIC Blood Culture adequate volume   Culture   Final    NO GROWTH 5 DAYS Performed at [facility], [address] [place], [place] [address]    Report Status 04/01/2024 FINAL  Final  Culture, blood (Routine x 2)     Status: None   Collection Time: 03/27/24 11:36 PM   Specimen: BLOOD  Result Value Ref Range Status   Specimen Description BLOOD BLOOD LEFT ARM  Final   Special Requests   Final    BOTTLES DRAWN AEROBIC ONLY Blood Culture adequate volume   Culture   Final    NO GROWTH 5 DAYS Performed at [facility], [address], [place], [place] [address]    Report Status 04/01/2024 FINAL  Final  Aerobic/Anaerobic Culture w Gram Stain (surgical/deep wound)     Status: None (Preliminary result)   Collection  Time: 03/29/24 12:26 PM   Specimen: PATH Bone resection; Tissue  Result Value Ref Range Status   Specimen Description TISSUE  Final   Special Requests NONE  Final   Gram Stain   Final    RARE WBC PRESENT, PREDOMINANTLY PMN RARE GRAM NEGATIVE RODS Performed at [facility], [address] [place], [place] [address]    Culture  Final    FEW PSEUDOMONAS AERUGINOSA FEW ENTEROBACTER CLOACAE RARE PROTEUS MIRABILIS NO ANAEROBES ISOLATED; CULTURE IN PROGRESS FOR 5 DAYS    Report Status PENDING  Incomplete   Organism ID, Bacteria PSEUDOMONAS AERUGINOSA  Final   Organism ID, Bacteria PROTEUS MIRABILIS  Final   Organism ID, Bacteria ENTEROBACTER CLOACAE  Final      Susceptibility   Enterobacter cloacae - MIC*    CEFEPIME  <=0.12 SENSITIVE Sensitive     ERTAPENEM <=0.12 SENSITIVE Sensitive     CIPROFLOXACIN  0.5 INTERMEDIATE Intermediate     GENTAMICIN  <=1 SENSITIVE Sensitive     MEROPENEM  <=0.25 SENSITIVE Sensitive     TRIMETH /SULFA  >=320 RESISTANT Resistant     PIP/TAZO Value in next row Sensitive ug/mL     8 SENSITIVEThis is a modified FDA-approved test that has been validated and its performance characteristics determined by the reporting laboratory.  This laboratory is certified under the Clinical Laboratory Improvement Amendments CLIA as qualified to perform high complexity clinical laboratory testing.    * FEW ENTEROBACTER CLOACAE   Pseudomonas aeruginosa - MIC*    MEROPENEM  Value in next row Sensitive      8 SENSITIVEThis is a modified FDA-approved test that has been validated and its performance characteristics determined by the reporting laboratory.  This laboratory is certified under the Clinical Laboratory Improvement Amendments CLIA as qualified to perform high complexity clinical laboratory testing.    CIPROFLOXACIN  Value in next row Sensitive      8 SENSITIVEThis is a modified FDA-approved test that has been validated and its performance characteristics determined by the  reporting laboratory.  This laboratory is certified under the Clinical Laboratory Improvement Amendments CLIA as qualified to perform high complexity clinical laboratory testing.    IMIPENEM Value in next row Sensitive      8 SENSITIVEThis is a modified FDA-approved test that has been validated and its performance characteristics determined by the reporting laboratory.  This laboratory is certified under the Clinical Laboratory Improvement Amendments CLIA as qualified to perform high complexity clinical laboratory testing.    PIP/TAZO Value in next row Sensitive ug/mL     16 SENSITIVEThis is a modified FDA-approved test that has been validated and its performance characteristics determined by the reporting laboratory.  This laboratory is certified under the Clinical Laboratory Improvement Amendments CLIA as qualified to perform high complexity clinical laboratory testing.    CEFEPIME  Value in next row Sensitive      16 SENSITIVEThis is a modified FDA-approved test that has been validated and its performance characteristics determined by the reporting laboratory.  This laboratory is certified under the Clinical Laboratory Improvement Amendments CLIA as qualified to perform high complexity clinical laboratory testing.    CEFTAZIDIME/AVIBACTAM Value in next row Sensitive ug/mL     16 SENSITIVEThis is a modified FDA-approved test that has been validated and its performance characteristics determined by the reporting laboratory.  This laboratory is certified under the Clinical Laboratory Improvement Amendments CLIA as qualified to perform high complexity clinical laboratory testing.    CEFTOLOZANE/TAZOBACTAM Value in next row Sensitive ug/mL     16 SENSITIVEThis is a modified FDA-approved test that has been validated and its performance characteristics determined by the reporting laboratory.  This laboratory is certified under the Clinical Laboratory Improvement Amendments CLIA as qualified to perform high  complexity clinical laboratory testing.    TOBRAMYCIN Value in next row Sensitive      16 SENSITIVEThis is a modified FDA-approved test that has been validated and its performance  characteristics determined by the reporting laboratory.  This laboratory is certified under the Clinical Laboratory Improvement Amendments CLIA as qualified to perform high complexity clinical laboratory testing.    CEFTAZIDIME Value in next row Sensitive      16 SENSITIVEThis is a modified FDA-approved test that has been validated and its performance characteristics determined by the reporting laboratory.  This laboratory is certified under the Clinical Laboratory Improvement Amendments CLIA as qualified to perform high complexity clinical laboratory testing.    * FEW PSEUDOMONAS AERUGINOSA   Proteus mirabilis - MIC*    AMPICILLIN  Value in next row Sensitive      16 SENSITIVEThis is a modified FDA-approved test that has been validated and its performance characteristics determined by the reporting laboratory.  This laboratory is certified under the Clinical Laboratory Improvement Amendments CLIA as qualified to perform high complexity clinical laboratory testing.    CEFAZOLIN  (NON-URINE) Value in next row Intermediate      16 SENSITIVEThis is a modified FDA-approved test that has been validated and its performance characteristics determined by the reporting laboratory.  This laboratory is certified under the Clinical Laboratory Improvement Amendments CLIA as qualified to perform high complexity clinical laboratory testing.    CEFEPIME  Value in next row Sensitive      16 SENSITIVEThis is a modified FDA-approved test that has been validated and its performance characteristics determined by the reporting laboratory.  This laboratory is certified under the Clinical Laboratory Improvement Amendments CLIA as qualified to perform high complexity clinical laboratory testing.    ERTAPENEM Value in next row Sensitive      16  SENSITIVEThis is a modified FDA-approved test that has been validated and its performance characteristics determined by the reporting laboratory.  This laboratory is certified under the Clinical Laboratory Improvement Amendments CLIA as qualified to perform high complexity clinical laboratory testing.    CEFTRIAXONE Value in next row Sensitive      16 SENSITIVEThis is a modified FDA-approved test that has been validated and its performance characteristics determined by the reporting laboratory.  This laboratory is certified under the Clinical Laboratory Improvement Amendments CLIA as qualified to perform high complexity clinical laboratory testing.    CIPROFLOXACIN  Value in next row Sensitive      16 SENSITIVEThis is a modified FDA-approved test that has been validated and its performance characteristics determined by the reporting laboratory.  This laboratory is certified under the Clinical Laboratory Improvement Amendments CLIA as qualified to perform high complexity clinical laboratory testing.    GENTAMICIN  Value in next row Sensitive      16 SENSITIVEThis is a modified FDA-approved test that has been validated and its performance characteristics determined by the reporting laboratory.  This laboratory is certified under the Clinical Laboratory Improvement Amendments CLIA as qualified to perform high complexity clinical laboratory testing.    MEROPENEM  Value in next row Sensitive      16 SENSITIVEThis is a modified FDA-approved test that has been validated and its performance characteristics determined by the reporting laboratory.  This laboratory is certified under the Clinical Laboratory Improvement Amendments CLIA as qualified to perform high complexity clinical laboratory testing.    TRIMETH /SULFA  Value in next row Sensitive      16 SENSITIVEThis is a modified FDA-approved test that has been validated and its performance characteristics determined by the reporting laboratory.  This laboratory is  certified under the Clinical Laboratory Improvement Amendments CLIA as qualified to perform high complexity clinical laboratory testing.    AMPICILLIN /SULBACTAM Value in next  row Sensitive      16 SENSITIVEThis is a modified FDA-approved test that has been validated and its performance characteristics determined by the reporting laboratory.  This laboratory is certified under the Clinical Laboratory Improvement Amendments CLIA as qualified to perform high complexity clinical laboratory testing.    PIP/TAZO Value in next row Sensitive ug/mL     <=4 SENSITIVEThis is a modified FDA-approved test that has been validated and its performance characteristics determined by the reporting laboratory.  This laboratory is certified under the Clinical Laboratory Improvement Amendments CLIA as qualified to perform high complexity clinical laboratory testing.    * RARE PROTEUS MIRABILIS     Labs: BNP (last 3 results) Recent Labs    03/08/24 2357  BNP 269.2*   Basic Metabolic Panel: Recent Labs  Lab 03/27/24 2336 03/28/24 0620 03/29/24 0428 03/31/24 0237 04/01/24 0242  NA 136 137 143 139 138  K 3.3* 3.3* 3.6 3.6 3.6  CL 100 107 107 102 104  CO2 27 27 27 28 28   GLUCOSE 309* 135* 91 213* 196*  BUN 9 9 8 11 12   CREATININE 0.82 0.68 0.73 0.76 0.87  CALCIUM  8.8* 8.6* 8.7* 8.2* 8.4*  MG  --   --   --  1.1* 1.7   Liver Function Tests: Recent Labs  Lab 03/27/24 2336 03/28/24 0620  AST 15 14*  ALT 27 21  ALKPHOS 77 63  BILITOT 0.7 1.2  PROT 6.5 5.7*  ALBUMIN 2.9* 2.4*   No results for input(s): LIPASE, AMYLASE in the last 168 hours. No results for input(s): AMMONIA in the last 168 hours. CBC: Recent Labs  Lab 03/27/24 2336 03/28/24 0620 03/29/24 0428 03/31/24 0237  WBC 10.6* 10.3 8.6 7.2  NEUTROABS 6.6 6.7  --  4.0  HGB 13.4 13.2 12.7* 11.9*  HCT 38.8* 37.9* 36.6* 34.2*  MCV 94.9 92.7 92.4 91.9  PLT 220 200 196 184   Cardiac Enzymes: No results for input(s): CKTOTAL,  CKMB, CKMBINDEX, TROPONINI in the last 168 hours. BNP: Invalid input(s): POCBNP CBG: Recent Labs  Lab 03/31/24 1115 03/31/24 1549 04/01/24 0540 04/01/24 0803 04/01/24 1205  GLUCAP 179* 158* 183* 104* 174*   D-Dimer No results for input(s): DDIMER in the last 72 hours. Hgb A1c No results for input(s): HGBA1C in the last 72 hours. Lipid Profile No results for input(s): CHOL, HDL, LDLCALC, TRIG, CHOLHDL, LDLDIRECT in the last 72 hours. Thyroid  function studies No results for input(s): TSH, T4TOTAL, T3FREE, THYROIDAB in the last 72 hours.  Invalid input(s): FREET3 Anemia work up No results for input(s): VITAMINB12, FOLATE, FERRITIN, TIBC, IRON, RETICCTPCT in the last 72 hours. Urinalysis    Component Value Date/Time   COLORURINE YELLOW 03/27/2024 2358   APPEARANCEUR CLEAR 03/27/2024 2358   LABSPEC 1.010 03/27/2024 2358   PHURINE 5.0 03/27/2024 2358   GLUCOSEU >=500 (A) 03/27/2024 2358   GLUCOSEU >=1000 (A) 02/22/2024 1454   HGBUR NEGATIVE 03/27/2024 2358   BILIRUBINUR NEGATIVE 03/27/2024 2358   KETONESUR NEGATIVE 03/27/2024 2358   PROTEINUR 30 (A) 03/27/2024 2358   UROBILINOGEN 0.2 02/22/2024 1454   NITRITE NEGATIVE 03/27/2024 2358   LEUKOCYTESUR MODERATE (A) 03/27/2024 2358   Sepsis Labs Recent Labs  Lab 03/27/24 2336 03/28/24 0620 03/29/24 0428 03/31/24 0237  WBC 10.6* 10.3 8.6 7.2   Microbiology Recent Results (from the past 240 hours)  Culture, blood (Routine x 2)     Status: None   Collection Time: 03/27/24 11:36 PM   Specimen: BLOOD  Result Value Ref  Range Status   Specimen Description BLOOD BLOOD RIGHT ARM  Final   Special Requests   Final    BOTTLES DRAWN AEROBIC AND ANAEROBIC Blood Culture adequate volume   Culture   Final    NO GROWTH 5 DAYS Performed at [facility], [address], [place], [place] [address]    Report Status 04/01/2024 FINAL  Final  Culture, blood (Routine x 2)     Status:  None   Collection Time: 03/27/24 11:36 PM   Specimen: BLOOD  Result Value Ref Range Status   Specimen Description BLOOD BLOOD LEFT ARM  Final   Special Requests   Final    BOTTLES DRAWN AEROBIC ONLY Blood Culture adequate volume   Culture   Final    NO GROWTH 5 DAYS Performed at [facility], [address] [place], [place] [address]    Report Status 04/01/2024 FINAL  Final  Aerobic/Anaerobic Culture w Gram Stain (surgical/deep wound)     Status: None (Preliminary result)   Collection Time: 03/29/24 12:26 PM   Specimen: PATH Bone resection; Tissue  Result Value Ref Range Status   Specimen Description TISSUE  Final   Special Requests NONE  Final   Gram Stain   Final    RARE WBC PRESENT, PREDOMINANTLY PMN RARE GRAM NEGATIVE RODS Performed at [facility], [address], [place], [place] [address]    Culture   Final    FEW PSEUDOMONAS AERUGINOSA FEW ENTEROBACTER CLOACAE RARE PROTEUS MIRABILIS NO ANAEROBES ISOLATED; CULTURE IN PROGRESS FOR 5 DAYS    Report Status PENDING  Incomplete   Organism ID, Bacteria PSEUDOMONAS AERUGINOSA  Final   Organism ID, Bacteria PROTEUS MIRABILIS  Final   Organism ID, Bacteria ENTEROBACTER CLOACAE  Final      Susceptibility   Enterobacter cloacae - MIC*    CEFEPIME  <=0.12 SENSITIVE Sensitive     ERTAPENEM <=0.12 SENSITIVE Sensitive     CIPROFLOXACIN  0.5 INTERMEDIATE Intermediate     GENTAMICIN  <=1 SENSITIVE Sensitive     MEROPENEM  <=0.25 SENSITIVE Sensitive     TRIMETH /SULFA  >=320 RESISTANT Resistant     PIP/TAZO Value in next row Sensitive ug/mL     8 SENSITIVEThis is a modified FDA-approved test that has been validated and its performance characteristics determined by the reporting laboratory.  This laboratory is certified under the Clinical Laboratory Improvement Amendments CLIA as qualified to perform high complexity clinical laboratory testing.    * FEW ENTEROBACTER CLOACAE   Pseudomonas aeruginosa - MIC*    MEROPENEM  Value in  next row Sensitive      8 SENSITIVEThis is a modified FDA-approved test that has been validated and its performance characteristics determined by the reporting laboratory.  This laboratory is certified under the Clinical Laboratory Improvement Amendments CLIA as qualified to perform high complexity clinical laboratory testing.    CIPROFLOXACIN  Value in next row Sensitive      8 SENSITIVEThis is a modified FDA-approved test that has been validated and its performance characteristics determined by the reporting laboratory.  This laboratory is certified under the Clinical Laboratory Improvement Amendments CLIA as qualified to perform high complexity clinical laboratory testing.    IMIPENEM Value in next row Sensitive      8 SENSITIVEThis is a modified FDA-approved test that has been validated and its performance characteristics determined by the reporting laboratory.  This laboratory is certified under the Clinical Laboratory Improvement Amendments CLIA as qualified to perform high complexity clinical laboratory testing.  PIP/TAZO Value in next row Sensitive ug/mL     16 SENSITIVEThis is a modified FDA-approved test that has been validated and its performance characteristics determined by the reporting laboratory.  This laboratory is certified under the Clinical Laboratory Improvement Amendments CLIA as qualified to perform high complexity clinical laboratory testing.    CEFEPIME  Value in next row Sensitive      16 SENSITIVEThis is a modified FDA-approved test that has been validated and its performance characteristics determined by the reporting laboratory.  This laboratory is certified under the Clinical Laboratory Improvement Amendments CLIA as qualified to perform high complexity clinical laboratory testing.    CEFTAZIDIME/AVIBACTAM Value in next row Sensitive ug/mL     16 SENSITIVEThis is a modified FDA-approved test that has been validated and its performance characteristics determined by the  reporting laboratory.  This laboratory is certified under the Clinical Laboratory Improvement Amendments CLIA as qualified to perform high complexity clinical laboratory testing.    CEFTOLOZANE/TAZOBACTAM Value in next row Sensitive ug/mL     16 SENSITIVEThis is a modified FDA-approved test that has been validated and its performance characteristics determined by the reporting laboratory.  This laboratory is certified under the Clinical Laboratory Improvement Amendments CLIA as qualified to perform high complexity clinical laboratory testing.    TOBRAMYCIN Value in next row Sensitive      16 SENSITIVEThis is a modified FDA-approved test that has been validated and its performance characteristics determined by the reporting laboratory.  This laboratory is certified under the Clinical Laboratory Improvement Amendments CLIA as qualified to perform high complexity clinical laboratory testing.    CEFTAZIDIME Value in next row Sensitive      16 SENSITIVEThis is a modified FDA-approved test that has been validated and its performance characteristics determined by the reporting laboratory.  This laboratory is certified under the Clinical Laboratory Improvement Amendments CLIA as qualified to perform high complexity clinical laboratory testing.    * FEW PSEUDOMONAS AERUGINOSA   Proteus mirabilis - MIC*    AMPICILLIN  Value in next row Sensitive      16 SENSITIVEThis is a modified FDA-approved test that has been validated and its performance characteristics determined by the reporting laboratory.  This laboratory is certified under the Clinical Laboratory Improvement Amendments CLIA as qualified to perform high complexity clinical laboratory testing.    CEFAZOLIN  (NON-URINE) Value in next row Intermediate      16 SENSITIVEThis is a modified FDA-approved test that has been validated and its performance characteristics determined by the reporting laboratory.  This laboratory is certified under the Clinical Laboratory  Improvement Amendments CLIA as qualified to perform high complexity clinical laboratory testing.    CEFEPIME  Value in next row Sensitive      16 SENSITIVEThis is a modified FDA-approved test that has been validated and its performance characteristics determined by the reporting laboratory.  This laboratory is certified under the Clinical Laboratory Improvement Amendments CLIA as qualified to perform high complexity clinical laboratory testing.    ERTAPENEM Value in next row Sensitive      16 SENSITIVEThis is a modified FDA-approved test that has been validated and its performance characteristics determined by the reporting laboratory.  This laboratory is certified under the Clinical Laboratory Improvement Amendments CLIA as qualified to perform high complexity clinical laboratory testing.    CEFTRIAXONE Value in next row Sensitive      16 SENSITIVEThis is a modified FDA-approved test that has been validated and its performance characteristics determined by the reporting laboratory.  This laboratory  is certified under the Clinical Laboratory Improvement Amendments CLIA as qualified to perform high complexity clinical laboratory testing.    CIPROFLOXACIN  Value in next row Sensitive      16 SENSITIVEThis is a modified FDA-approved test that has been validated and its performance characteristics determined by the reporting laboratory.  This laboratory is certified under the Clinical Laboratory Improvement Amendments CLIA as qualified to perform high complexity clinical laboratory testing.    GENTAMICIN  Value in next row Sensitive      16 SENSITIVEThis is a modified FDA-approved test that has been validated and its performance characteristics determined by the reporting laboratory.  This laboratory is certified under the Clinical Laboratory Improvement Amendments CLIA as qualified to perform high complexity clinical laboratory testing.    MEROPENEM  Value in next row Sensitive      16 SENSITIVEThis is a  modified FDA-approved test that has been validated and its performance characteristics determined by the reporting laboratory.  This laboratory is certified under the Clinical Laboratory Improvement Amendments CLIA as qualified to perform high complexity clinical laboratory testing.    TRIMETH /SULFA  Value in next row Sensitive      16 SENSITIVEThis is a modified FDA-approved test that has been validated and its performance characteristics determined by the reporting laboratory.  This laboratory is certified under the Clinical Laboratory Improvement Amendments CLIA as qualified to perform high complexity clinical laboratory testing.    AMPICILLIN /SULBACTAM Value in next row Sensitive      16 SENSITIVEThis is a modified FDA-approved test that has been validated and its performance characteristics determined by the reporting laboratory.  This laboratory is certified under the Clinical Laboratory Improvement Amendments CLIA as qualified to perform high complexity clinical laboratory testing.    PIP/TAZO Value in next row Sensitive ug/mL     <=4 SENSITIVEThis is a modified FDA-approved test that has been validated and its performance characteristics determined by the reporting laboratory.  This laboratory is certified under the Clinical Laboratory Improvement Amendments CLIA as qualified to perform high complexity clinical laboratory testing.    * RARE PROTEUS MIRABILIS     Time coordinating discharge: 35 minutes  SIGNED:   Sophie Mao, MD  [facility] 04/01/2024, 2:10 PM

## 2024-04-04 ENCOUNTER — Telehealth: Payer: Self-pay | Admitting: *Deleted

## 2024-04-04 ENCOUNTER — Telehealth: Payer: Self-pay | Admitting: Licensed Clinical Social Worker

## 2024-04-04 NOTE — Telephone Encounter (Signed)
 H&V Care Navigation CSW Progress Note  Clinical Social Worker contacted patient by phone to f/u post hospital stay and provide reminder for appt tomorrow with endocrinology. No answer today at [phone]. Voicemail full. Will re-attempt again as able, note attempt by Laine, RN, with [facility] team.   Patient is participating in a Managed Medicaid Plan:  No, [facility] Medicare  SDOH Screenings   Food Insecurity: Food Insecurity Present (03/28/2024)  Housing: High Risk (03/28/2024)  Transportation Needs: Unmet Transportation Needs (03/28/2024)  Utilities: At Risk (03/28/2024)  Alcohol Screen: Low Risk  (06/10/2023)  Depression (PHQ2-9): Low Risk  (03/18/2024)  Financial Resource Strain: High Risk (03/11/2024)  Physical Activity: Inactive (06/10/2023)  Social Connections: Socially Isolated (03/28/2024)  Stress: Stress Concern Present (03/11/2024)  Tobacco Use: High Risk (03/29/2024)  Health Literacy: Inadequate Health Literacy (02/17/2024)    [name], MSW, LCSW Clinical Social Worker [facility] Heart/Vascular Care Navigation  [phone]- work cell phone (preferred)

## 2024-04-04 NOTE — Transitions of Care (Post Inpatient/ED Visit) (Signed)
   04/04/2024  Name: [name] MRN: [id number] DOB: 02/03/1956  Today's TOC FU Call Status: Today's TOC FU Call Status:: Unsuccessful Call (1st Attempt) Unsuccessful Call (1st Attempt) Date: 04/04/24  Attempted to reach the patient regarding the most recent Inpatient visit.  Received automated outgoing voice message stating that the person you are trying to call has a voice mail box that is full; please hang up and try your call again later; unable to leave voice message requesting call back   Follow Up Plan: Additional outreach attempts will be made to reach the patient to complete the Transitions of Care (Post Inpatient/ED visit) call.   Pls call/ message for questions,  [name], RN, BSN, CCRN Alumnus RN Care Manager  Transitions of Care  [facility] - [facility] [phone]: direct office

## 2024-04-05 ENCOUNTER — Ambulatory Visit (INDEPENDENT_AMBULATORY_CARE_PROVIDER_SITE_OTHER): Admitting: Podiatry

## 2024-04-05 ENCOUNTER — Encounter: Payer: Self-pay | Admitting: Podiatry

## 2024-04-05 ENCOUNTER — Ambulatory Visit (INDEPENDENT_AMBULATORY_CARE_PROVIDER_SITE_OTHER): Admitting: Internal Medicine

## 2024-04-05 ENCOUNTER — Encounter: Payer: Self-pay | Admitting: Internal Medicine

## 2024-04-05 ENCOUNTER — Telehealth: Payer: Self-pay | Admitting: *Deleted

## 2024-04-05 ENCOUNTER — Telehealth: Payer: Self-pay | Admitting: Licensed Clinical Social Worker

## 2024-04-05 VITALS — BP 138/82 | HR 78 | Ht 68.0 in | Wt 156.0 lb

## 2024-04-05 DIAGNOSIS — L97522 Non-pressure chronic ulcer of other part of left foot with fat layer exposed: Secondary | ICD-10-CM

## 2024-04-05 DIAGNOSIS — Z794 Long term (current) use of insulin: Secondary | ICD-10-CM | POA: Diagnosis not present

## 2024-04-05 DIAGNOSIS — E1142 Type 2 diabetes mellitus with diabetic polyneuropathy: Secondary | ICD-10-CM

## 2024-04-05 DIAGNOSIS — E1159 Type 2 diabetes mellitus with other circulatory complications: Secondary | ICD-10-CM | POA: Diagnosis not present

## 2024-04-05 DIAGNOSIS — E1151 Type 2 diabetes mellitus with diabetic peripheral angiopathy without gangrene: Secondary | ICD-10-CM

## 2024-04-05 DIAGNOSIS — I739 Peripheral vascular disease, unspecified: Secondary | ICD-10-CM

## 2024-04-05 DIAGNOSIS — Z89422 Acquired absence of other left toe(s): Secondary | ICD-10-CM | POA: Insufficient documentation

## 2024-04-05 DIAGNOSIS — E1165 Type 2 diabetes mellitus with hyperglycemia: Secondary | ICD-10-CM

## 2024-04-05 LAB — POCT GLUCOSE (DEVICE FOR HOME USE): POC Glucose: 339 mg/dL — AB (ref 70–99)

## 2024-04-05 MED ORDER — SITAGLIPTIN PHOSPHATE 100 MG PO TABS: 100.0000 mg | ORAL_TABLET | Freq: Every day | ORAL | 3 refills | Status: DC

## 2024-04-05 MED ORDER — ACCU-CHEK GUIDE TEST VI STRP: 1.0000 | ORAL_STRIP | Freq: Three times a day (TID) | 12 refills | Status: AC

## 2024-04-05 MED ORDER — DEXCOM G7 SENSOR MISC
1.0000 | 3 refills | Status: DC
Start: 2024-04-05 — End: 2024-04-06

## 2024-04-05 MED ORDER — ACCU-CHEK GUIDE W/DEVICE KIT: 1.0000 | PACK | Freq: Every day | 0 refills | Status: AC

## 2024-04-05 MED ORDER — TOUJEO MAX SOLOSTAR 300 UNIT/ML ~~LOC~~ SOPN: 40.0000 [IU] | PEN_INJECTOR | Freq: Every morning | SUBCUTANEOUS | 3 refills | Status: DC

## 2024-04-05 NOTE — Telephone Encounter (Signed)
 H&V Care Navigation CSW Progress Note  Clinical Social Worker contacted patient by phone to f/u post hospital stay and provide reminder for appt today with endocrinology. No answer again today at [phone]. Voicemail still full. Will re-attempt again as able.    Patient is participating in a Managed Medicaid Plan:  No, [facility] Medicare  SDOH Screenings   Food Insecurity: Food Insecurity Present (03/28/2024)  Housing: High Risk (03/28/2024)  Transportation Needs: Unmet Transportation Needs (03/28/2024)  Utilities: At Risk (03/28/2024)  Alcohol Screen: Low Risk  (06/10/2023)  Depression (PHQ2-9): Low Risk  (03/18/2024)  Financial Resource Strain: High Risk (03/11/2024)  Physical Activity: Inactive (06/10/2023)  Social Connections: Socially Isolated (03/28/2024)  Stress: Stress Concern Present (03/11/2024)  Tobacco Use: High Risk (03/29/2024)  Health Literacy: Inadequate Health Literacy (02/17/2024)     [name], MSW, LCSW Clinical Social Worker II [facility] Heart/Vascular Care Navigation  [phone]- work cell phone (preferred)

## 2024-04-05 NOTE — Patient Instructions (Addendum)
 Increase Toujeo  45 units daily  Start Januvia , 100 mg, 1 tablet daily      HOW TO TREAT LOW BLOOD SUGARS (Blood sugar LESS THAN 70 MG/DL) Please follow the RULE OF 15 for the treatment of hypoglycemia treatment (when your (blood sugars are less than 70 mg/dL)   STEP 1: Take 15 grams of carbohydrates when your blood sugar is low, which includes:  3-4 GLUCOSE TABS  OR 3-4 OZ OF JUICE OR REGULAR SODA OR ONE TUBE OF GLUCOSE GEL    STEP 2: RECHECK blood sugar in 15 MINUTES STEP 3: If your blood sugar is still low at the 15 minute recheck --> then, go back to STEP 1 and treat AGAIN with another 15 grams of carbohydrates.

## 2024-04-05 NOTE — Progress Notes (Signed)
  Subjective:  Patient ID: [name], male    DOB: Dec 07, 1955,  MRN: [id number]  Chief Complaint  Patient presents with   Routine Post Op    AMPUTATION LEFT GREAT TOE (Left) Ulcer left heel. IDDM A1C 11.2 0 pain. Wearing surgical shoe.    DOS: 03/29/2024 Procedure: 1.  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  Irrigation and debridement with prep for graft of ulceration plantar first MPJ, 3 x 2 x 1 cm, left foot 3.  Application dermal allograft 3 x 2 cm, left foot  68 y.o. male seen for post op check.  He is coming today approximately 1 week after above procedures.  Has been changing the dressing.  Review of Systems: Negative except as noted in the HPI. Denies N/V/F/Ch.   Objective:   Constitutional Well developed. Well nourished.  Vascular Foot warm and well perfused. Capillary refill normal to all digits.   No calf pain with palpation  Neurologic Normal speech. Oriented to person, place, and time. Epicritic sensation diminished to the forefoot all painful at the  Dermatologic Large ulceration of the plantar aspect of the first metatarsal head area there is some necrotic and fibrotic tissue though decreased from prior overall relatively healthy however there is a maggot present in the wound bed.  The fifth toe amputation site appears improved with healthy eschar overlying the amputation site which appears to be fully healed.  The hallux amputation site also appears relatively well coapted with no dehiscence or drainage.        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 LEFT GREAT TOE, AMPUTATION:  Gangrenous ulcer extending to bone with associated marked acute  cellulitis showing gangrenous and coagulative necrosis  Reactive sclerotic trabecular bone, negative for acute osteomyelitis   Micro:   FEW PSEUDOMONAS AERUGINOSA FEW ENTEROBACTER CLOACAE RARE PROTEUS MIRABILIS RARE PROVIDENCIA  RETTGERI    Assessment:   1. 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 PVD (peripheral vascular disease) (HCC)   3. Type 2 diabetes mellitus with diabetic polyneuropathy, unspecified whether long term insulin  use (HCC)     Plan:  Patient was evaluated and treated and all questions answered.  POD # 7 s/p left foot revision hallux amputation to MPJ level with plantar forefoot subfirst met head ulceration debridement with graft application -Progressing after the above surgery.  The hallux amputation site does appear healthy at this time.  Wound plantar aspect first metatarsal head is the main concern there is limited healing here and the wound is dirty with maggots present.  Wound cultures with several organisms some with resistance to multiple oral agents -Recommend ID referral to assist with antibiotic selection at this point in time -Patient is at risk for needing further amputation of the first ray or heel plantarly.  This has been a concern secondary to vascularity as well as hygiene issues that the patient is facing at home due to socioeconomic concerns -XR: Deferred today -WB Status: Weightbearing as tolerated in postop shoe -Sutures: Sutures remain intact on the hallux amputation site to 2 more weeks. -Medications/ABX: Referral to ID placed for antibiotic selection -Dressing: Recommend he leave the silver alginate 4 x 4 gauze Kerlix and Ace wrap dressing intact clean and dry until follow-up - F/u Plan: Follow-up in 1 week        [name], DPM [facility]

## 2024-04-05 NOTE — Transitions of Care (Post Inpatient/ED Visit) (Signed)
   04/05/2024  Name: [name] MRN: [id number] DOB: 1956/05/05  Today's TOC FU Call Status: Today's TOC FU Call Status:: Unsuccessful Call (2nd Attempt) Unsuccessful Call (2nd Attempt) Date: 04/05/24  Attempted to reach the patient regarding the most recent Inpatient visit.  Received automated outgoing voice message stating that the person you are trying to call has a voice mail box that is full; please hang up and try your call again later; unable to leave voice message requesting call back   Follow Up Plan: Additional outreach attempts will be made to reach the patient to complete the Transitions of Care (Post Inpatient/ED visit) call.   Pls call/ message for questions,  Tenoch Mcclure Mckinney Lunell Robart, RN, BSN, CCRN Alumnus RN Care Manager  Transitions of Care  VBCI - [facility] [phone]: direct office

## 2024-04-05 NOTE — Progress Notes (Signed)
 Name: [name]  Age/ Sex: 68 y.o., male   MRN/ DOB: [id number], April 21, 1956     PCP: Norleen Lynwood LELON, MD   Reason for Endocrinology Evaluation: Type 2 Diabetes Mellitus  Initial Endocrine Consultative Visit: 09/06/2012    PATIENT IDENTIFIER: Mr. [name] is a 68 y.o. male with a past medical history of T2DM, HTN and Hx of bladder cancer and CAD. The patient has followed with Endocrinology clinic since 09/06/2012 for consultative assistance with management of his diabetes.  DIABETIC HISTORY:  Mr. Sitzer was diagnosed with DM 1992.  He used to be on Metformin - diarrhea. His hemoglobin A1c has ranged from 6.9% in 2013, peaking at 10.9% in 2021  He attributes bladder cancer to actos   The pt was on Ozempic  in 2024 but this was discontinued in 12/2023 due to multiple hospitalizations   SUBJECTIVE:   During the last visit (07/07/2023): A1c 7.2%     Today (04/05/2024): Mr. [name]  is here for a follow up on diabetes management .He has not been checking glucose at home as his meter hasn't been working   Patient was admitted in May, 2025 for left toe cellulitis/osteomyelitis and sepsis, s/p left fifth toe amputation 12/2023 S/p left SFA stent 12/2023, which was occluded and presented with ischemia of the left lower extremity July, 2025 for aortogram and angioplasty, with restenting of the left SFA.    Patient underwent amputation of the left great toe 02/2024  He had another admission end of July, 2025 due to hypoxemia, and a viral infection He presented again to the ED with left foot cellulitis requiring debridement and change in antibiotics   Patient follows with podiatry and vascular surgery   He is on pt assistance with Toujeo  and Ozempic    Patient continues to follow-up with cardiology for peripheral artery disease, CAD, paroxysmal SVT, HTN and dyslipidemia   Had a temp of 99 last night  No nausea or vomiting  Has diarrhea due to Abx intake      HOME DIABETES REGIMEN:   Toujeo  35 units daily  Ozempic  1 mg weekly ( Monday ) - not taking     Statin: yes ACE-I/ARB: yes   METER DOWNLOAD SUMMARY: n/a     DIABETIC COMPLICATIONS: Microvascular complications:  Neuropathy, amputation left great toe 02/2024,  Denies: CKD  Last Eye Exam: Completed 06/17/2023  Macrovascular complications:  CAD, PVD (S/P SFA stent 12/2023) Denies: CVA   HISTORY:  Past Medical History:  Past Medical History:  Diagnosis Date   Adenomatous colon polyp    Allergic rhinitis    At risk for sleep apnea    STOP-BANG= 5   SENT TO PCP 03-14-2014   Bladder cancer (HCC)    CAD (coronary artery disease)    a. 07/2014 low risk MV; b. 07/2019 Cor CTA (FFR): LM 25-49 (nl), LAD mild prox/mid plaque (nl), D1 25-49p(nl w/ abnl FFR of 0.73 in inf branch), LCX/OM1 mild prox/mid plaque (nl), RCA nondominant, minimal Ca2+ plaque (nl), RPDA (mildly abnl @ 0.79)-->Med Rx..   Cataract    surgically removed bilateral   Condyloma acuminatum of penis    Diabetic neuropathy (HCC)    Diastolic dysfunction    a. 05/2019 Echo: EF 60-65%, no rwam, mod LVH, impaired relaxation, nl RV size/fxn, trace MR, triv TR.   Diverticulosis    GERD (gastroesophageal reflux disease)    History of bladder cancer    s/p  turbt  2013/   transitional cell carcinoma--  History of condyloma acuminatum    PERINEAL AREA  W/ RECURRENCY   History of gout    Hyperlipidemia    Hypertension    Hypoxemia 03/09/2024   Lower urinary tract symptoms (LUTS)    PAF (paroxysmal atrial fibrillation) (HCC)    a. 06/2019 Event monitor: PAF <1% burden. Longest 3 mins 36 secs.   Productive cough    PSVT (paroxysmal supraventricular tachycardia) (HCC)    a. 06/2019 Event monitor: 112 episodes of SVT, longest 21 secs.   PVD (peripheral vascular disease) with claudication (HCC)    a. 03/2014 LE art duplex: long segment occlusion of mid to distal R SFA; b. 03/2020 ABI: nl left and mildly improved R ABI->med rx.   Renal artery  stenosis (HCC)    Renal artery stenosis (HCC)    a. 12/2020 Renal art duplex: RRA 1-59%, LRA >60%.   Smokers' cough (HCC)    Type 2 diabetes mellitus with insulin  therapy [facility]) 1992   monitor by  dr ellsion   Wears dentures    upper   Past Surgical History:  Past Surgical History:  Procedure Laterality Date   ABDOMINAL AORTOGRAM W/LOWER EXTREMITY N/A 02/15/2024   Procedure: ABDOMINAL AORTOGRAM W/LOWER EXTREMITY;  Surgeon: Pearline Norman RAMAN, MD;  Location: [facility] INVASIVE CV LAB;  Service: Cardiovascular;  Laterality: N/A;   AMPUTATION Left 01/02/2024   Procedure: AMPUTATION, FOOT, RAY;  Surgeon: Malvin Marsa FALCON, DPM;  Location: [facility] OR;  Service: Orthopedics/Podiatry;  Laterality: Left;  FIFTH TOE   AMPUTATION TOE Left 02/29/2024   Procedure: AMPUTATION, LEFT GREAT TOE;  Surgeon: Malvin Marsa FALCON, DPM;  Location: [facility] OR;  Service: Orthopedics/Podiatry;  Laterality: Left;  Left Great toe partial amputation,  Debridement Left Heel   AMPUTATION TOE Left 03/29/2024   Procedure: AMPUTATION LEFT GREAT TOE;  Surgeon: Malvin Marsa FALCON, DPM;  Location: [facility] OR;  Service: Orthopedics/Podiatry;  Laterality: Left;  LEFT GREAT TOE   AXILLARY HIDRADENITIS EXCISION  1997   CARDIOVASCULAR STRESS TEST  07-24-2014  dr darron grass   Low risk lexiscan  nuclear study with apical thinning and small inferolateral wall infarct at mid & basal level , no ischemia/  normal LVF and wall motion , ef 59%   CATARACT EXTRACTION Left    CATARACT EXTRACTION W/ INTRAOCULAR LENS IMPLANT Right    CO2 LASER APPLICATION N/A 03/20/2014   Procedure: CO2 LASER APPLICATION,PENIS, GROIN, ANUS;  Surgeon: Elspeth KYM Schultze, MD;  Location: [facility];  Service: General;  Laterality: N/A;   CO2 LASER APPLICATION N/A 05/21/2015   Procedure: CO2 LASER APPLICATION;  Surgeon: Mark Ottelin, MD;  Location: [facility];  Service: Urology;  Laterality: N/A;   CONDYLOMA EXCISION/FULGURATION N/A 05/21/2015    Procedure: CONDYLOMA REMOVAL;  Surgeon: Mark Ottelin, MD;  Location: [facility];  Service: Urology;  Laterality: N/A;   HEMORRHOID SURGERY  10/24/2014   Procedure: HEMORRHOIDECTOMY;  Surgeon: Elspeth Schultze, MD;  Location: [facility];  Service: General;;   INCISION AND DRAINAGE ABSCESS Left 10/24/2014   Procedure: INCISION AND DRAINAGE ABSCESS;  Surgeon: Elspeth Schultze, MD;  Location: [facility];  Service: General;  Laterality: Left;   INCISION AND DRAINAGE OF WOUND Left 03/29/2024   Procedure: IRRIGATION AND DEBRIDEMENT PLANTAR FOOT WOUND;  Surgeon: Malvin Marsa FALCON, DPM;  Location: [facility];  Service: Orthopedics/Podiatry;  Laterality: Left;  LEFT GREAT TOE   INGUINAL HIDRADENITIS EXCISION  1998, 1999   LASER ABLATION CONDOLAMATA N/A 03/20/2014   Procedure: EXAM  UNDER ANESTHESIA, REMOVAL/ABLATION OF CONDYLOMATA PENIS,GROINS, ANUS, ANAL CANAL;  Surgeon: Elspeth KYM Schultze, MD;  Location: [facility];  Service: General;  Laterality: N/A;  groin and anus   LASER ABLATION CONDOLAMATA N/A 10/24/2014   Procedure: LASER ABLATION CONDOLAMATA;  Surgeon: Elspeth Schultze, MD;  Location: [facility];  Service: General;  Laterality: N/A;   LASER ABLATION OF PENILE AND PERIANAL WARTS  07-29-2007  Dr. Schultze   LEFT SHOULDER SURGERY  2003   LOWER EXTREMITY ANGIOGRAPHY N/A 01/01/2024   Procedure: Lower Extremity Angiography;  Surgeon: Sheree Penne Bruckner, MD;  Location: [facility] INVASIVE CV LAB;  Service: Vascular;  Laterality: N/A;   LOWER EXTREMITY ANGIOGRAPHY N/A 02/15/2024   Procedure: Lower Extremity Angiography;  Surgeon: Pearline Norman RAMAN, MD;  Location: [facility] INVASIVE CV LAB;  Service: Cardiovascular;  Laterality: N/A;   LOWER EXTREMITY INTERVENTION  01/01/2024   Procedure: LOWER EXTREMITY INTERVENTION;  Surgeon: Sheree Penne Bruckner, MD;  Location: [facility] INVASIVE CV LAB;  Service: Vascular;;   LOWER EXTREMITY INTERVENTION N/A 02/15/2024    Procedure: LOWER EXTREMITY INTERVENTION;  Surgeon: Pearline Norman RAMAN, MD;  Location: [facility] INVASIVE CV LAB;  Service: Cardiovascular;  Laterality: N/A;   MASS EXCISION N/A 10/24/2014   Procedure: EXCISION OF PERINEAL MASS/SINUS;  Surgeon: Elspeth Schultze, MD;  Location: [facility];  Service: General;  Laterality: N/A;   MOHS SURGERY     back   MOHS SURGERY  2017   face   MULTIPLE TOOTH EXTRACTIONS     PERINEAL HIDRADENITIS EXCISION  1998, 1999   TRANSURETHRAL RESECTION OF BLADDER TUMOR  05/21/2012   Procedure: TRANSURETHRAL RESECTION OF BLADDER TUMOR (TURBT);  Surgeon: Mark C Ottelin, MD;  Location: [facility];  Service: Urology;  Laterality: N/A;      Social History:  reports that he has been smoking cigars and cigarettes. He started smoking about 49 years ago. He has a 41 pack-year smoking history. He has never used smokeless tobacco. He reports that he does not drink alcohol and does not use drugs. Family History:  Family History  Problem Relation Age of Onset   Hypertension Mother    Lung cancer Father    Diabetes Maternal Aunt        x 2   Colon cancer Neg Hx    Esophageal cancer Neg Hx    Pancreatic cancer Neg Hx    Prostate cancer Neg Hx    Kidney disease Neg Hx    Liver disease Neg Hx    Rectal cancer Neg Hx    Stomach cancer Neg Hx      HOME MEDICATIONS: Allergies as of 04/05/2024       Reactions   Cefepime  Hives   Had allergic reaction to one of these 3 agents, unclear which one   Metoprolol  Hives   Had allergic reaction to one of these 3 agents, unclear which one *Per RN, highly likely this is the cause of allergic rxn   Myrbetriq  [mirabegron ] Hives   Had allergic reaction to one of these 3 agents, unclear which one        Medication List        Accurate as of April 05, 2024  1:34 PM. If you have any questions, ask your nurse or doctor.          aspirin  EC 81 MG tablet Take 1 tablet (81 mg total) by mouth daily. Swallow  whole.   atorvastatin  40 MG tablet Commonly known as: LIPITOR  TAKE  1 TABLET BY MOUTH ONCE DAILY   ciprofloxacin  750 MG tablet Commonly known as: CIPRO  Take 1 tablet (750 mg total) by mouth 2 (two) times daily for 28 days.   citalopram  20 MG tablet Commonly known as: CELEXA  Take 1 tablet (20 mg total) by mouth daily.   clopidogrel  75 MG tablet Commonly known as: Plavix  Take 1 tablet (75 mg total) by mouth daily.   irbesartan  300 MG tablet Commonly known as: AVAPRO  Take 1 tablet (300 mg total) by mouth daily.   loratadine  10 MG tablet Commonly known as: CLARITIN  Take 10 mg by mouth daily as needed.   oxyCODONE -acetaminophen  5-325 MG tablet Commonly known as: Percocet Take 1 tablet by mouth every 6 (six) hours as needed for severe pain (pain score 7-10).   pantoprazole  40 MG tablet Commonly known as: PROTONIX  Take 1 tablet (40 mg total) by mouth daily.   Toujeo  Max SoloStar 300 UNIT/ML Solostar Pen Generic drug: insulin  glargine (2 Unit Dial ) Inject 35 Units into the skin every morning.         OBJECTIVE:   Vital Signs: BP 138/82 (BP Location: Left Arm, Patient Position: Sitting, Cuff Size: Normal)   Pulse 78   Ht 5' 8 (1.727 m)   Wt 156 lb (70.8 kg)   SpO2 98%   BMI 23.72 kg/m   Wt Readings from Last 3 Encounters:  04/05/24 156 lb (70.8 kg)  03/29/24 152 lb (68.9 kg)  03/15/24 152 lb 3.2 oz (69 kg)     Exam: General: Pt appears well and is in NAD  Lungs: Clear with good BS bilat   Heart: RRR  Extremities: Left foot boot in place  Neuro: MS is good with appropriate affect, pt is alert and Ox3    DM Foot Exam 03/15/2024 per podiatry     DATA REVIEWED:  Lab Results  Component Value Date   HGBA1C 11.2 (H) 02/22/2024   HGBA1C 14.3 (H) 12/31/2023   HGBA1C 7.2 (A) 07/07/2023    Latest Reference Range & Units 04/01/24 02:42  Sodium 135 - 145 mmol/L 138  Potassium 3.5 - 5.1 mmol/L 3.6  Chloride 98 - 111 mmol/L 104  CO2 22 - 32 mmol/L 28   Glucose 70 - 99 mg/dL 803 (H)  BUN 8 - 23 mg/dL 12  Creatinine 9.38 - 8.75 mg/dL 9.12  Calcium  8.9 - 10.3 mg/dL 8.4 (L)  Anion gap 5 - 15  6  Magnesium  1.7 - 2.4 mg/dL 1.7  GFR, Estimated >39 mL/min >60    In office BG 339 mg/DL  Old records , labs and images have been reviewed.    ASSESSMENT / PLAN / RECOMMENDATIONS:   1) Type 2 Diabetes Mellitus, poorly controlled, With macrovascular and neuropathic complications - Most recent A1c of 11.2 %. Goal A1c < 7.0 %.    - Patient has been noting worsening glycemic control, he has had multiple ED admissions for variable reasons, during this process he is not on Ozempic  anymore, patient is not interested in any more weight loss -Patient with multiple social determinants.  I did suggest prandial insulin  based on correction scale but he has no refrigerator at home right now and has been using a cooler to keep his medication.  I have opted to start him on Januvia  instead -He is also on much less Toujeo  (currently 35 units) than previously prescribed which was 100 units a day? ,  Will increase Toujeo  as below  -Patient used to be on patient assistance program for  Toujeo  and Ozempic  -He is intolerant to Victoza  1.8 mg -Intolerant to metformin  -Patient attributes bladder cancer to pioglitazone use -Not a candidate for SGLT2 inhibitors at this time due to history of bladder cancer and increased risk of infection - Before he left the office today, my assistant applied Dexcom sensor, he was also provided with a receiver and a prescription for Dexcom will be faxed to DME supplier - A prescription for Accu-Chek meter and strips will also be sent   MEDICATIONS: Increase Toujeo  45 units daily Start Januvia  100 mg daily  EDUCATION / INSTRUCTIONS: BG monitoring instructions: Patient is instructed to check his blood sugars 1 times a day, fasting. Call [place] [facility] clinic if: BG persistently < 70 I reviewed the Rule of 15 for the treatment of  hypoglycemia in detail with the patient. Literature supplied.   2) Diabetic complications:  Eye: Does not have known diabetic retinopathy.  Neuro/ Feet: Does not have known diabetic peripheral neuropathy .  Renal: Patient does not have known baseline CKD. He   is  on an ACEI/ARB at present.      F/U in 3 months    I spent 25 minutes preparing to see the patient by review of recent labs, imaging and procedures, obtaining and reviewing separately obtained history, communicating with the patient, ordering medications, tests or procedures, and documenting clinical information in the EHR including the differential Dx, treatment, and any further evaluation and other management    Signed electronically by: Stefano Redgie Butts, MD  [facility] Endocrinology  [facility] [address] [place] [place], [place] [address] Phone: [phone] FAX: [phone]   CC: Norleen Lynwood ORN, MD [address] [place] [address] Phone: [phone]  Fax: [phone]  Return to Endocrinology clinic as below: Future Appointments  Date Time Provider Department Center  04/05/2024  3:30 PM Malvin Marsa FALCON, [place] [facility]  04/13/2024 11:30 AM Luiz Channel, MD [facility]  04/25/2024 11:00 AM Land, Chrystal M, LCSW [facility] None

## 2024-04-06 ENCOUNTER — Telehealth: Payer: Self-pay | Admitting: Licensed Clinical Social Worker

## 2024-04-06 ENCOUNTER — Other Ambulatory Visit: Payer: Self-pay

## 2024-04-06 ENCOUNTER — Telehealth: Payer: Self-pay | Admitting: *Deleted

## 2024-04-06 LAB — MINIMUM INHIBITORY CONC. (1 DRUG)

## 2024-04-06 LAB — MIC RESULT

## 2024-04-06 MED ORDER — DEXCOM G7 SENSOR MISC: 1.0000 | 3 refills | Status: DC

## 2024-04-06 NOTE — Telephone Encounter (Signed)
 Patient want medication to go to the pharmacy

## 2024-04-06 NOTE — Transitions of Care (Post Inpatient/ED Visit) (Signed)
   04/06/2024  Name: SHAYLON ADEN MRN: [id number] DOB: October 07, 1955  Today's TOC FU Call Status: Today's TOC FU Call Status:: Unsuccessful Call (3rd Attempt) Unsuccessful Call (3rd Attempt) Date: 04/06/24  Attempted to reach the patient regarding the most recent Inpatient visit.  Received automated outgoing voice message stating that the person you are trying to call has a voice mail box that is full; please hang up and try your call again later; unable to leave voice message requesting call back   Follow Up Plan: No further outreach attempts will be made at this time. We have been unable to contact the patient.  Pls call/ message for questions,  Johnjoseph Rolfe Mckinney Anavictoria Wilk, RN, BSN, CCRN Alumnus RN Care Manager  Transitions of Care  [facility]BCI - [facility] [phone]: direct office

## 2024-04-06 NOTE — Telephone Encounter (Signed)
 H&V Care Navigation CSW Progress Note  Clinical Social Worker contacted patient by phone to f/u post hospital stay. Note pt was able to attend endocrinology and podiatry appts. No answer again today at [phone]. Voicemail still full. Will re-attempt again as able.    Patient is participating in a Managed Medicaid Plan:  No, [facility] Medicare  SDOH Screenings   Food Insecurity: Food Insecurity Present (03/28/2024)  Housing: High Risk (03/28/2024)  Transportation Needs: Unmet Transportation Needs (03/28/2024)  Utilities: At Risk (03/28/2024)  Alcohol Screen: Low Risk  (06/10/2023)  Depression (PHQ2-9): Low Risk  (03/18/2024)  Financial Resource Strain: High Risk (03/11/2024)  Physical Activity: Inactive (06/10/2023)  Social Connections: Socially Isolated (03/28/2024)  Stress: Stress Concern Present (03/11/2024)  Tobacco Use: High Risk (04/05/2024)  Health Literacy: Inadequate Health Literacy (02/17/2024)    [name], MSW, LCSW Clinical Social Worker II [facility] Heart/Vascular Care Navigation  [phone]- work cell phone (preferred)

## 2024-04-12 ENCOUNTER — Ambulatory Visit (INDEPENDENT_AMBULATORY_CARE_PROVIDER_SITE_OTHER): Admitting: Podiatry

## 2024-04-12 DIAGNOSIS — I739 Peripheral vascular disease, unspecified: Secondary | ICD-10-CM

## 2024-04-12 DIAGNOSIS — E1142 Type 2 diabetes mellitus with diabetic polyneuropathy: Secondary | ICD-10-CM | POA: Diagnosis not present

## 2024-04-12 DIAGNOSIS — L97522 Non-pressure chronic ulcer of other part of left foot with fat layer exposed: Secondary | ICD-10-CM | POA: Diagnosis not present

## 2024-04-12 NOTE — Progress Notes (Signed)
 Subjective:  Patient ID: [name], male    DOB: April 05, 1956,  MRN: [id number]  Chief Complaint  Patient presents with   Routine Post Op    AMPUTATION LEFT GREAT TOE. Plantar ulcer 1st met. Left foot heel wound. 0 pain. Dressing changes. A1C 11.2. NIDDM.    DOS: 03/29/2024 Procedure: 1.  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  Irrigation and debridement with prep for graft of ulceration plantar first MPJ, 3 x 2 x 1 cm, left foot 3.  Application dermal allograft 3 x 2 cm, left foot  68 y.o. male seen for post op check.  He reports he has been changing dressing daily to the left foot.  Reports that he has seen decreasing amounts of drainage from the left foot wound does have some pain with that wound underneath the great toe amputation site.  Review of Systems: Negative except as noted in the HPI. Denies N/V/F/Ch.   Objective:   Constitutional Well developed. Well nourished.  Vascular Foot warm and well perfused. Capillary refill normal to all digits.   No calf pain with palpation  Neurologic Normal speech. Oriented to person, place, and time. Epicritic sensation diminished to the forefoot all painful at the  Dermatologic Amputation site of the left great toe is fully healed no evidence of necrosis or residual infection at that area.  The left fifth toe amputation site also appears to be doing well has healed and from prior with eschar over the amputation site  Attention directed to the plantar aspect of the first metatarsal head there is circular ulceration measuring 1.5 x 2.5 cm and approximately 0.5 cm deep.  This appears slightly improved prior with less maceration and healthier wound bed.       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 LEFT GREAT TOE, AMPUTATION:  Gangrenous ulcer extending to bone with associated marked acute  cellulitis showing gangrenous and coagulative necrosis   Reactive sclerotic trabecular bone, negative for acute osteomyelitis   Micro:   FEW PSEUDOMONAS AERUGINOSA FEW ENTEROBACTER CLOACAE RARE PROTEUS MIRABILIS RARE PROVIDENCIA RETTGERI    Assessment:   1. 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 PVD (peripheral vascular disease) (HCC)   3. Type 2 diabetes mellitus with diabetic polyneuropathy, unspecified whether long term insulin  use (HCC)      Plan:  Patient was evaluated and treated and all questions answered.  2 weeks  s/p left foot revision hallux amputation to MPJ level with plantar forefoot subfirst met head ulceration debridement with graft application - Overall he is improved today.  The hallux amputation site appears to be well-healed and the sutures were removed from there. -Will proceed with local wound care for the plantar first met head ulceration.  Continue offloading with postop shoe and wound care to include daily saline or Betadine wet-to-dry gauze dressing.  I provided the patient with dressing supplies today as he said he is not out of gauze and gauze roll -XR: Deferred today -WB Status: Weightbearing as tolerated in postop shoe -Sutures: Sutures from hallux amputation site were removed in toto -Medications/ABX: The patient will be able to see infectious disease for further antibiotic recommendations -Dressing: Recommend 2-3 times weekly Betadine or saline wet-to-dry dressing to the left foot and provided him with appointment to do so - F/u Plan: Follow-up in 2 week  Procedure: Excisional Debridement of Wound Rationale: Removal of non-viable soft  tissue from the wound to promote healing.  Anesthesia: None required Pre-Debridement Wound Measurements: 2.5 cm x 1.5 cm x 0.5 cm  Post-Debridement Wound Measurements: 2.5 cm x 1.5 cm x 0.5 cm  Type of Debridement: Excisional Tissue Removed: Non-viable soft tissue Depth of Debridement: Subcutaneous fat tissue Instrumentation: Tissue nipper Technique:  Sharp excisional debridement to bleeding, viable wound base.  Dressing: Dry, sterile, compression dressing. Disposition: Patient tolerated procedure well. Patient to return in 1-2 week for follow-up.         Marolyn JULIANNA Honour, DPM [facility] / [facility]

## 2024-04-13 ENCOUNTER — Inpatient Hospital Stay: Payer: Self-pay | Admitting: Internal Medicine

## 2024-04-13 ENCOUNTER — Telehealth: Payer: Self-pay

## 2024-04-13 NOTE — Telephone Encounter (Signed)
 Called patient to reschedule today's missed appointment, call could not be completed.   Namish Krise, BSN, RN

## 2024-04-15 ENCOUNTER — Telehealth: Payer: Self-pay | Admitting: Radiology
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
## 2024-04-18 ENCOUNTER — Telehealth: Payer: Self-pay | Admitting: Licensed Clinical Social Worker
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
## 2024-04-18 NOTE — Telephone Encounter (Signed)
 H&V Care Navigation CSW Progress Note  Clinical Social Worker contacted DSS social worker Hadassah Fila to f/u again regarding rep payee services since pt currently has phone out of service. Left voicemail noting that we havent been able to reach pt and trying to see what options he may have for further support/if rep payee services were ever established.   Patient is participating in a Managed Medicaid Plan:  No, [facility] Medicare  SDOH Screenings   Food Insecurity: Food Insecurity Present (03/28/2024)  Housing: High Risk (03/28/2024)  Transportation Needs: Unmet Transportation Needs (03/28/2024)  Utilities: At Risk (03/28/2024)  Alcohol Screen: Low Risk  (06/10/2023)  Depression (PHQ2-9): Low Risk  (03/18/2024)  Financial Resource Strain: High Risk (03/11/2024)  Physical Activity: Inactive (06/10/2023)  Social Connections: Socially Isolated (03/28/2024)  Stress: Stress Concern Present (03/11/2024)  Tobacco Use: High Risk (04/05/2024)  Health Literacy: Inadequate Health Literacy (02/17/2024)     [name], MSW, LCSW Clinical Social Worker [facility] Heart/Vascular Care Navigation  [phone]- work cell phone (preferred)

## 2024-04-20 ENCOUNTER — Telehealth: Payer: Self-pay
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
## 2024-04-22 LAB — AEROBIC/ANAEROBIC CULTURE W GRAM STAIN (SURGICAL/DEEP WOUND)

## 2024-04-25 ENCOUNTER — Encounter: Payer: Self-pay | Admitting: *Deleted

## 2024-04-25 ENCOUNTER — Telehealth: Payer: Self-pay | Admitting: *Deleted

## 2024-04-25 NOTE — Patient Outreach (Signed)
 Outreach attempt made to patient using an alternate number provided by the [facility] with [phone].  There was no answer. Additional outreach attempts will be made to patient to assess for case management services.  Sayge Brienza, LCSW [place]  [facility], [facility] Licensed Clinical Social Worker  Direct Dial : [phone]

## 2024-04-26 ENCOUNTER — Encounter: Payer: Self-pay | Admitting: Podiatry

## 2024-04-26 ENCOUNTER — Ambulatory Visit (INDEPENDENT_AMBULATORY_CARE_PROVIDER_SITE_OTHER): Admitting: Podiatry

## 2024-04-26 DIAGNOSIS — I739 Peripheral vascular disease, unspecified: Secondary | ICD-10-CM

## 2024-04-26 DIAGNOSIS — E1142 Type 2 diabetes mellitus with diabetic polyneuropathy: Secondary | ICD-10-CM

## 2024-04-26 DIAGNOSIS — L97522 Non-pressure chronic ulcer of other part of left foot with fat layer exposed: Secondary | ICD-10-CM | POA: Diagnosis not present

## 2024-04-26 NOTE — Progress Notes (Addendum)
 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 5 pain. Wearing regular shoe. IDDM A1C 11.2  Plantar ulcer sub met 1 using large band aid.    DOS: 03/29/2024 Procedure: 1.  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  Irrigation and debridement with prep for graft of ulceration plantar first MPJ, 3 x 2 x 1 cm, left foot 3.  Application dermal allograft 3 x 2 cm, left foot  68 y.o. male seen for post op check.  Patient reports he has been doing a Band-Aid style dressing to the wound underneath the great toe amputation site.  Denies drainage.  Is wearing regular shoe.  Review of Systems: Negative except as noted in the HPI. Denies N/V/F/Ch.   Objective:   Constitutional Well developed. Well nourished.  Vascular Foot warm and well perfused. Capillary refill normal to all digits.   No calf pain with palpation  Neurologic Normal speech. Oriented to person, place, and time. Epicritic sensation diminished to the forefoot all painful at the  Dermatologic Amputation site of the left great toe is fully healed no evidence of necrosis or residual infection at that area.  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 LEFT GREAT TOE, AMPUTATION:  Gangrenous ulcer extending to bone with associated marked acute  cellulitis showing gangrenous and coagulative necrosis  Reactive sclerotic trabecular bone, negative for acute osteomyelitis    Micro:   FEW PSEUDOMONAS AERUGINOSA FEW ENTEROBACTER CLOACAE RARE PROTEUS MIRABILIS RARE PROVIDENCIA RETTGERI    Assessment:   1. 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 PVD (peripheral vascular disease) (HCC)   3. Type 2 diabetes mellitus with diabetic polyneuropathy, unspecified whether long term insulin  use (HCC)       Plan:  Patient was evaluated and treated and all questions answered.  4 weeks  s/p left foot revision hallux amputation to MPJ level with plantar forefoot subfirst met head ulceration debridement with graft application - Overall he is improved today.  Hallux amputation site is fully healed. -Continue with local wound care to the ulceration underneath the first metatarsal head area.  This is much improved from prior with granular tissue no evidence of infection. -Wound was debrided lightly with a tissue nipper as below -XR: Deferred today -WB Status: Weightbearing as tolerated in postop shoe -Sutures: Previously removed -Medications/ABX: ABX per ID -Dressing: Recommend daily antibiotic ointment and large Band-Aid or gauze dressing to the ulceration in the left plantar forefoot - F/u Plan: Follow-up in 3 week  Procedure: Excisional Debridement of Wound Rationale: Removal of non-viable soft tissue from the wound to promote healing.  Anesthesia: None required Pre-Debridement Wound Measurements: 2 cm x 1 cm x 0.3 cm  Post-Debridement Wound Measurements: 2 cm x 1 cm x 0.3 cm  Type of Debridement: Excisional Tissue Removed: Non-viable soft tissue Depth of  Debridement: Subcutaneous fat tissue Instrumentation: Tissue nipper Technique: Sharp excisional debridement to bleeding, viable wound base.  Dressing: Dry, sterile, compression dressing. Disposition: Patient tolerated procedure well. Patient to return in 3 week for follow-up.         Marolyn JULIANNA Honour, DPM [facility] / [facility]

## 2024-04-28 ENCOUNTER — Telehealth: Payer: Self-pay | Admitting: Radiology

## 2024-04-28 DIAGNOSIS — Z89429 Acquired absence of other toe(s), unspecified side: Secondary | ICD-10-CM

## 2024-04-28 DIAGNOSIS — I739 Peripheral vascular disease, unspecified: Secondary | ICD-10-CM
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
## 2024-04-29 ENCOUNTER — Telehealth: Payer: Self-pay | Admitting: *Deleted

## 2024-04-29 NOTE — Progress Notes (Signed)
 Complex Care Management Care Guide Note  04/29/2024 Name: [name] MRN: [id number] DOB: [birth year]  FAHIM KATS is a 68 y.o. year old male who is a primary care patient of Norleen Lynwood LELON, MD and is actively engaged with the care management team. I reached out to [name] by phone today to assist with re-scheduling  with the Licensed Clinical Child psychotherapist.  Follow up plan: Unsuccessful telephone outreach attempt made. A HIPAA compliant phone message was left for the patient providing contact information and requesting a return call.  [name], CMA, Care Guide [facility]  [facility], [facility] Guide Direct Dial : [phone]  Fax: [phone] Website: [email or web address]

## 2024-05-02 NOTE — Progress Notes (Signed)
 Complex Care Management Care Guide Note  05/02/2024 Name: [name] MRN: [id number] DOB: October 10, 1955  [name] is a 68 y.o. year old male who is a primary care patient of Norleen Lynwood LELON, MD and is actively engaged with the care management team. I reached out to [name] by phone today to assist with re-scheduling  with the Licensed Clinical Child psychotherapist.  Follow up plan: Unsuccessful telephone outreach attempt made. A HIPAA compliant phone message was left for the patient providing contact information and requesting a return call. No further outreach attempts will be made due to inability to maintain patient contact.   [name], CMA, Care Guide [facility]  [facility], [facility] Guide Direct Dial : [phone]  Fax: [phone] Website: [email or web address]

## 2024-05-06 ENCOUNTER — Telehealth: Payer: Self-pay | Admitting: Radiology
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
## 2024-05-10 NOTE — Telephone Encounter (Signed)
 Ok [facility] order for RN wound care and PT is done

## 2024-05-10 NOTE — Addendum Note (Signed)
 Addended by: NORLEEN LYNWOOD ORN on: 05/10/2024 11:35 AM   Modules accepted: Orders

## 2024-05-17 ENCOUNTER — Ambulatory Visit: Admitting: Podiatry

## 2024-05-17 DIAGNOSIS — I739 Peripheral vascular disease, unspecified: Secondary | ICD-10-CM

## 2024-05-17 DIAGNOSIS — E1142 Type 2 diabetes mellitus with diabetic polyneuropathy: Secondary | ICD-10-CM

## 2024-05-17 DIAGNOSIS — L97522 Non-pressure chronic ulcer of other part of left foot with fat layer exposed: Secondary | ICD-10-CM

## 2024-05-17 NOTE — Progress Notes (Signed)
 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 2 pain walking. IDDM A1C 11.2. Ulcer with drainage plantar left sub met 1.    DOS: 03/29/2024 Procedure: 1.  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  Irrigation and debridement with prep for graft of ulceration plantar first MPJ, 3 x 2 x 1 cm, left foot 3.  Application dermal allograft 3 x 2 cm, left foot  68 y.o. male seen for post op check.  Patient reports he has been doing a Band-Aid style dressing to the wound underneath the great toe amputation site.   Patient has been wearing regular shoe and notes that there was a lot of drainage in the sock.  Unclear if he has been putting any type of bandage or dressing on.  He says it was fully healed and reopened however I do not recall seeing the wound healed  Review of Systems: Negative except as noted in the HPI. Denies N/V/F/Ch.   Objective:   Constitutional Well developed. Well nourished.  Vascular Foot warm and well perfused. Capillary refill normal to all digits.   No calf pain with palpation  Neurologic Normal speech. Oriented to person, place, and time. Epicritic sensation diminished to the forefoot all painful at the  Dermatologic Amputation site of the left great toe is fully healed no evidence of necrosis or residual infection at that area.  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 LEFT GREAT TOE, AMPUTATION:  Gangrenous ulcer extending to bone with associated marked acute   cellulitis showing gangrenous and coagulative necrosis  Reactive sclerotic trabecular bone, negative for acute osteomyelitis   Micro:   FEW PSEUDOMONAS AERUGINOSA FEW ENTEROBACTER CLOACAE RARE PROTEUS MIRABILIS RARE PROVIDENCIA RETTGERI    Assessment:   1. 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 PVD (peripheral vascular disease)   3. Type 2 diabetes mellitus with diabetic polyneuropathy, unspecified whether long term insulin  use (HCC)        Plan:  Patient was evaluated and treated and all questions answered.  7 weeks  s/p left foot revision hallux amputation to MPJ level with plantar forefoot subfirst met head ulceration debridement with graft application - Overall he is improved today.  Hallux amputation site is fully healed. -Continue with local wound care to the ulceration underneath the first metatarsal head area.  This is much improved from prior with granular tissue no evidence of infection. -Wound was debrided lightly with a tissue nipper as below -XR: Deferred today -WB Status: Weightbearing as tolerated in regular shoe though would prefer postop shoe -Sutures: Previously removed -Medications/ABX: ABX per ID -Dressing: Recommend daily antibiotic ointment and large Band-Aid or gauze dressing to the ulceration in the left plantar forefoot - F/u Plan: Follow-up in 3 week  Procedure: Excisional Debridement of Wound Rationale: Removal of non-viable soft tissue from the wound to promote healing.  Anesthesia: None required Pre-Debridement Wound Measurements: 1.5 cm x 0.5 cm  x 0.3 cm  Post-Debridement Wound Measurements:1.5 cm x 0.5 cm x 0.3 cm  Type of Debridement: Excisional Tissue Removed: Non-viable soft tissue Depth of Debridement: Subcutaneous fat tissue Instrumentation: Tissue nipper Technique: Sharp excisional debridement to bleeding, viable wound base.  Dressing: Dry, sterile, compression dressing. Disposition: Patient tolerated procedure well.  Patient to return in 3 week for follow-up.         Marolyn JULIANNA Honour, DPM [facility] / [facility]

## 2024-05-31 ENCOUNTER — Other Ambulatory Visit: Payer: Self-pay | Admitting: Internal Medicine

## 2024-05-31 DIAGNOSIS — E1151 Type 2 diabetes mellitus with diabetic peripheral angiopathy without gangrene: Secondary | ICD-10-CM

## 2024-05-31 MED ORDER — ATORVASTATIN CALCIUM 40 MG PO TABS: ORAL_TABLET | ORAL | 3 refills | Status: AC

## 2024-05-31 MED ORDER — TOUJEO MAX SOLOSTAR 300 UNIT/ML ~~LOC~~ SOPN: 40.0000 [IU] | PEN_INJECTOR | Freq: Every morning | SUBCUTANEOUS | 3 refills | Status: DC

## 2024-05-31 MED ORDER — CITALOPRAM HYDROBROMIDE 20 MG PO TABS: 20.0000 mg | ORAL_TABLET | Freq: Every day | ORAL | 2 refills | Status: AC

## 2024-05-31 MED ORDER — IRBESARTAN 300 MG PO TABS: 300.0000 mg | ORAL_TABLET | Freq: Every day | ORAL | 3 refills | Status: AC

## 2024-05-31 MED ORDER — PANTOPRAZOLE SODIUM 40 MG PO TBEC: 40.0000 mg | DELAYED_RELEASE_TABLET | Freq: Every day | ORAL | 2 refills | Status: AC
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
## 2024-06-07 ENCOUNTER — Encounter: Payer: Self-pay | Admitting: Podiatry

## 2024-06-07 ENCOUNTER — Ambulatory Visit (INDEPENDENT_AMBULATORY_CARE_PROVIDER_SITE_OTHER): Admitting: Podiatry

## 2024-06-07 DIAGNOSIS — L97522 Non-pressure chronic ulcer of other part of left foot with fat layer exposed: Secondary | ICD-10-CM

## 2024-06-07 DIAGNOSIS — E1142 Type 2 diabetes mellitus with diabetic polyneuropathy: Secondary | ICD-10-CM | POA: Diagnosis not present

## 2024-06-07 DIAGNOSIS — I739 Peripheral vascular disease, unspecified: Secondary | ICD-10-CM | POA: Diagnosis not present

## 2024-06-07 NOTE — Progress Notes (Signed)
  Subjective:  Patient ID: [name], male    DOB: 1956/07/07,  MRN: [id number]  No chief complaint on file.   DOS: 03/29/2024 Procedure: 1.  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  Irrigation and debridement with prep for graft of ulceration plantar first MPJ, 3 x 2 x 1 cm, left foot 3.  Application dermal allograft 3 x 2 cm, left foot  68 y.o. male seen for post op check.   Patient reports the wound is improved.  He has been allowing it to heal and on its own.  He has not really been doing much for wound care.  Has a scab on the heel that is well-healed.  Walking regular shoe.  Review of Systems: Negative except as noted in the HPI. Denies N/V/F/Ch.   Objective:   Constitutional Well developed. Well nourished.  Vascular Foot warm and well perfused. Capillary refill normal to all digits.   No calf pain with palpation  Neurologic Normal speech. Oriented to person, place, and time. Epicritic sensation diminished to the forefoot all painful at the  Dermatologic Amputation site of the left great toe is fully healed    Attention directed to the plantar aspect of the first metatarsal head there is circular ulceration that is nearly fully healed there is some tunneling at the distal aspect of the wound though the base appears to be healed.  Probes to subcutaneous fat tissue distally but nearly fully healed.   Small scab in the plantar aspect of the heel with no ulceration underlying.          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 LEFT GREAT TOE, AMPUTATION:  Gangrenous ulcer extending to bone with associated marked acute  cellulitis showing gangrenous and coagulative necrosis  Reactive sclerotic trabecular bone, negative for acute osteomyelitis   Micro:   FEW PSEUDOMONAS AERUGINOSA FEW ENTEROBACTER CLOACAE RARE PROTEUS MIRABILIS RARE PROVIDENCIA RETTGERI    Assessment:   1. Ulcer  of left foot with fat layer exposed (HCC)   2. PVD (peripheral vascular disease)   3. Type 2 diabetes mellitus with diabetic polyneuropathy, unspecified whether long term insulin  use (HCC)         Plan:  Patient was evaluated and treated and all questions answered. 10 weeks  s/p left foot revision hallux amputation to MPJ level with plantar forefoot subfirst met head ulceration debridement with graft application - Overall he is improved today.  Hallux amputation site is fully healed. -Continue with local wound care to the ulceration underneath the first metatarsal head area.  This area is nearly fully healed at this time. -Wound was debrided lightly with a tissue nipper as below -XR: Deferred today -WB Status: Weightbearing as tolerated in regular shoe  -Sutures: Previously removed -Medications/ABX: Currently off antibiotics agree monitor off antibiotic -Dressing: Recommend daily antibiotic ointment and  -Aid or gauze dressing to the ulceration in the left plantar forefoot - F/u Plan: Follow-up in 4 weeks         Marolyn JULIANNA Honour, DPM [facility] / [facility]

## 2024-06-10 ENCOUNTER — Emergency Department (HOSPITAL_COMMUNITY)

## 2024-06-10 ENCOUNTER — Encounter (HOSPITAL_COMMUNITY): Payer: Self-pay

## 2024-06-10 ENCOUNTER — Emergency Department (HOSPITAL_COMMUNITY): Admission: EM | Admit: 2024-06-10 | Discharge: 2024-06-11 | Disposition: A | Discharge status: Home / Self Care

## 2024-06-10 ENCOUNTER — Other Ambulatory Visit: Payer: Self-pay

## 2024-06-10 DIAGNOSIS — Z7982 Long term (current) use of aspirin: Secondary | ICD-10-CM | POA: Insufficient documentation

## 2024-06-10 DIAGNOSIS — Z7902 Long term (current) use of antithrombotics/antiplatelets: Secondary | ICD-10-CM | POA: Insufficient documentation

## 2024-06-10 DIAGNOSIS — J441 Chronic obstructive pulmonary disease with (acute) exacerbation: Secondary | ICD-10-CM | POA: Diagnosis not present

## 2024-06-10 DIAGNOSIS — Z794 Long term (current) use of insulin: Secondary | ICD-10-CM | POA: Diagnosis not present

## 2024-06-10 DIAGNOSIS — Z79899 Other long term (current) drug therapy: Secondary | ICD-10-CM | POA: Insufficient documentation

## 2024-06-10 DIAGNOSIS — R059 Cough, unspecified: Secondary | ICD-10-CM | POA: Diagnosis present

## 2024-06-10 DIAGNOSIS — I10 Essential (primary) hypertension: Secondary | ICD-10-CM | POA: Diagnosis not present

## 2024-06-10 LAB — BASIC METABOLIC PANEL WITH GFR
Anion gap: 11 (ref 5–15)
BUN: 12 mg/dL (ref 8–23)
CO2: 27 mmol/L (ref 22–32)
Calcium: 7.8 mg/dL — ABNORMAL LOW (ref 8.9–10.3)
Chloride: 96 mmol/L — ABNORMAL LOW (ref 98–111)
Creatinine, Ser: 0.88 mg/dL (ref 0.61–1.24)
GFR, Estimated: >60 mL/min (ref >60)
Glucose, Bld: 314 mg/dL — ABNORMAL HIGH (ref 70–99)
Potassium: 3.2 mmol/L — ABNORMAL LOW (ref 3.5–5.1)
Sodium: 134 mmol/L — ABNORMAL LOW (ref 135–145)

## 2024-06-10 LAB — CBC
HCT: 41.4 % (ref 39.0–52.0)
Hemoglobin: 14.6 g/dL (ref 13.0–17.0)
MCH: 31.9 pg (ref 26.0–34.0)
MCHC: 35.3 g/dL (ref 30.0–36.0)
MCV: 90.6 fL (ref 80.0–100.0)
Platelets: 224 K/uL (ref 150–400)
RBC: 4.57 MIL/uL (ref 4.22–5.81)
RDW: 13.3 % (ref 11.5–15.5)
WBC: 12.2 K/uL — ABNORMAL HIGH (ref 4.0–10.5)
nRBC: 0 % (ref 0.0–0.2)

## 2024-06-10 LAB — RESP PANEL BY RT-PCR (RSV, FLU A&B, COVID)  RVPGX2
Influenza A by PCR: NEGATIVE
Influenza B by PCR: NEGATIVE
Resp Syncytial Virus by PCR: NEGATIVE
SARS Coronavirus 2 by RT PCR: NEGATIVE

## 2024-06-10 LAB — BRAIN NATRIURETIC PEPTIDE: B Natriuretic Peptide: 600.6 pg/mL — ABNORMAL HIGH (ref 0.0–100.0)

## 2024-06-10 MED ORDER — METHYLPREDNISOLONE SODIUM SUCC 125 MG IJ SOLR
125.0000 mg | Freq: Once | INTRAMUSCULAR | Status: AC
  Administered 2024-06-10: 125 mg via INTRAVENOUS
  Filled 2024-06-10: qty 2

## 2024-06-10 MED ORDER — IPRATROPIUM-ALBUTEROL 0.5-2.5 (3) MG/3ML IN SOLN
3.0000 mL | Freq: Once | RESPIRATORY_TRACT | Status: AC
  Administered 2024-06-10: 3 mL via RESPIRATORY_TRACT
  Filled 2024-06-10: qty 3

## 2024-06-10 MED ORDER — POTASSIUM CHLORIDE CRYS ER 20 MEQ PO TBCR
40.0000 meq | EXTENDED_RELEASE_TABLET | Freq: Once | ORAL | Status: AC
  Administered 2024-06-10: 40 meq via ORAL
  Filled 2024-06-10: qty 2
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
## 2024-06-10 NOTE — ED Notes (Signed)
 Patient 02 was 92% on RA while walking in room.

## 2024-06-10 NOTE — ED Triage Notes (Signed)
 Pt bib EMS from home for SOB. Hx of COPD. On 15L nonrebreather by fire department (80s on RA). 100% on RA for EMS. Afib on monitor (80-225). No meds given. Associated headache and CP when coughing (none at this time). No history of afib reported. NAD noted in triage. Cough x2-3 days, sob when lying down and when coughing.   18G L forearm.   156/84, 97%

## 2024-06-10 NOTE — ED Notes (Signed)
 Patient was given a diet ginger ale, with a cup of ice.

## 2024-06-11 LAB — HEPATIC FUNCTION PANEL
ALT: 14 U/L (ref 0–44)
AST: 19 U/L (ref 15–41)
Albumin: 2.6 g/dL — ABNORMAL LOW (ref 3.5–5.0)
Alkaline Phosphatase: 88 U/L (ref 38–126)
Bilirubin, Direct: 0.2 mg/dL (ref 0.0–0.2)
Indirect Bilirubin: 0.5 mg/dL (ref 0.3–0.9)
Total Bilirubin: 0.7 mg/dL (ref 0.0–1.2)
Total Protein: 5.8 g/dL — ABNORMAL LOW (ref 6.5–8.1)

## 2024-06-11 MED ORDER — PREDNISONE 50 MG PO TABS: ORAL_TABLET | ORAL | 0 refills | Status: DC

## 2024-06-11 MED ORDER — ALBUTEROL SULFATE HFA 108 (90 BASE) MCG/ACT IN AERS
2.0000 | INHALATION_SPRAY | Freq: Once | RESPIRATORY_TRACT | Status: AC
  Administered 2024-06-11: 2 via RESPIRATORY_TRACT
  Filled 2024-06-11: qty 6.7

## 2024-06-11 MED ORDER — AZITHROMYCIN 250 MG PO TABS: 250.0000 mg | ORAL_TABLET | Freq: Every day | ORAL | 0 refills | Status: DC

## 2024-06-11 MED ORDER — ALBUTEROL SULFATE HFA 108 (90 BASE) MCG/ACT IN AERS: 2.0000 | INHALATION_SPRAY | RESPIRATORY_TRACT | 0 refills | Status: AC | PRN

## 2024-06-11 NOTE — Discharge Instructions (Signed)
 Your workup today was reassuring.  Please take the steroids as prescribed and start the antibiotic.  Use 2 puffs of the albuterol  every 4 hours over the next 24 to 48 hours and follow-up with your doctor.  Return to the ER for worsening symptoms.

## 2024-06-13 LAB — MISC LABCORP TEST (SEND OUT)
LabCorp test name: 60202
Labcorp test code: 9985

## 2024-06-16 ENCOUNTER — Ambulatory Visit: Admitting: Internal Medicine

## 2024-06-17 ENCOUNTER — Telehealth: Payer: Self-pay

## 2024-06-17 ENCOUNTER — Encounter: Payer: Self-pay | Admitting: Pharmacist

## 2024-06-17 ENCOUNTER — Encounter: Payer: Self-pay | Admitting: Family Medicine

## 2024-06-17 ENCOUNTER — Ambulatory Visit: Admitting: Family Medicine

## 2024-06-17 VITALS — BP 146/60 | HR 71 | Temp 97.9°F | Ht 68.0 in | Wt 160.8 lb

## 2024-06-17 DIAGNOSIS — I10 Essential (primary) hypertension: Secondary | ICD-10-CM

## 2024-06-17 DIAGNOSIS — E1151 Type 2 diabetes mellitus with diabetic peripheral angiopathy without gangrene: Secondary | ICD-10-CM | POA: Diagnosis not present

## 2024-06-17 DIAGNOSIS — Z59819 Housing instability, housed unspecified: Secondary | ICD-10-CM

## 2024-06-17 DIAGNOSIS — F17200 Nicotine dependence, unspecified, uncomplicated: Secondary | ICD-10-CM

## 2024-06-17 DIAGNOSIS — Z794 Long term (current) use of insulin: Secondary | ICD-10-CM | POA: Diagnosis not present

## 2024-06-17 DIAGNOSIS — J4489 Other specified chronic obstructive pulmonary disease: Secondary | ICD-10-CM
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
## 2024-06-17 NOTE — Patient Instructions (Addendum)
 [facility] DSS [address], [place], [place] [address] [phone]  Please call social services for further information about resources available in your area.   Veterans Services in [facility] [address] [place], [place] [address] [phone]  Please call or go to [facility] in [place] at the above address.   Please follow up with this office as scheduled.
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
## 2024-06-17 NOTE — Progress Notes (Signed)
 Pharmacy Quality Measure Review  This patient is appearing on a report for being at risk of failing the Glycemic Status Assessment in Diabetes measure this calendar year.   Last documented A1c or GMI 11.2% on 02/22/24  Following with endo. A1c has been uncontrolled and is due for updated A1c. Has appt today for hospital follow up, left note to get updated A1c if able. If not, has upcoming endo appt 07/06/24.  Darrelyn Drum, PharmD, BCPS, CPP Clinical Pharmacist Practitioner [facility] [phone]

## 2024-06-20 ENCOUNTER — Telehealth: Payer: Self-pay | Admitting: *Deleted

## 2024-06-20 NOTE — Progress Notes (Signed)
 Complex Care Management Care Guide Note  06/20/2024 Name: [name] MRN: [id number] DOB: [birth year]  [name] is a 68 y.o. year old male who is a primary care patient of Norleen Lynwood LELON, MD and is actively engaged with the care management team. I reached out to [name] by phone today to assist with scheduling  with the Licensed Clinical Social Worker.  Follow up plan: Unsuccessful telephone outreach attempt made. A HIPAA compliant phone message was left for the patient providing contact information and requesting a return call.  [name], CMA [place]  [facility], [facility] Guide Direct Dial : [phone]  Fax: [phone] Website: [email or web address]

## 2024-06-22 NOTE — Progress Notes (Signed)
 Complex Care Management Care Guide Note  06/22/2024 Name: [name] MRN: [id number] DOB: Dec 18, 1955  [name] is a 68 y.o. year old male who is a primary care patient of Paul Lynwood LELON, MD and is actively engaged with the care management team. I reached out to [name] by phone today to assist with scheduling  with the Licensed Clinical Social Worker.  Follow up plan: Unsuccessful telephone outreach attempt made. A HIPAA compliant phone message was left for the patient providing contact information and requesting a return call.  [name], CMA [place]  [facility], [facility] Guide Direct Dial : [phone]  Fax: [phone] Website: [email or web address]

## 2024-06-28 NOTE — Progress Notes (Signed)
 Complex Care Management Care Guide Note  06/28/2024 Name: [name] MRN: [id number] DOB: 12-23-1955  [name] is a 68 y.o. year old male who is a primary care patient of Norleen Lynwood LELON, MD and is actively engaged with the care management team. I reached out to [name] by phone today to assist with scheduling  with the Licensed Clinical Social Worker.  Follow up plan: Telephone appointment with complex care management team member scheduled for:  07/05/2024  [name], CMA [place]  [facility], [facility] Guide Direct Dial : [phone]  Fax: [phone] Website: [email or web address]

## 2024-07-05 ENCOUNTER — Other Ambulatory Visit: Payer: Self-pay | Admitting: *Deleted

## 2024-07-05 ENCOUNTER — Encounter: Admitting: Podiatry

## 2024-07-05 ENCOUNTER — Other Ambulatory Visit: Payer: Self-pay

## 2024-07-05 MED ORDER — DEXCOM G7 SENSOR MISC: 3 refills | Status: AC

## 2024-07-06 ENCOUNTER — Ambulatory Visit: Admitting: Internal Medicine

## 2024-07-06 NOTE — Patient Outreach (Signed)
 Complex Care Management   Visit Note  07/06/2024  Name:  [name] MRN: [id number] DOB: 05/08/1956  Situation: Referral received for Complex Care Management related to SDOH Barriers:  Housing insecurity I obtained verbal consent from Patient.  Visit completed with Patient  on the phone on 07/05/24  Background:   Past Medical History:  Diagnosis Date   Adenomatous colon polyp    Allergic rhinitis    At risk for sleep apnea    STOP-BANG= 5   SENT TO PCP 03-14-2014   Bladder cancer (HCC)    CAD (coronary artery disease)    a. 07/2014 low risk MV; b. 07/2019 Cor CTA (FFR): LM 25-49 (nl), LAD mild prox/mid plaque (nl), D1 25-49p(nl w/ abnl FFR of 0.73 in inf branch), LCX/OM1 mild prox/mid plaque (nl), RCA nondominant, minimal Ca2+ plaque (nl), RPDA (mildly abnl @ 0.79)-->Med Rx..   Cataract    surgically removed bilateral   Condyloma acuminatum of penis    Diabetic neuropathy (HCC)    Diastolic dysfunction    a. 05/2019 Echo: EF 60-65%, no rwam, mod LVH, impaired relaxation, nl RV size/fxn, trace MR, triv TR.   Diverticulosis    GERD (gastroesophageal reflux disease)    History of bladder cancer    s/p  turbt  2013/   transitional cell carcinoma--    History of condyloma acuminatum    PERINEAL AREA  W/ RECURRENCY   History of gout    Hyperlipidemia    Hypertension    Hypoxemia 03/09/2024   Lower urinary tract symptoms (LUTS)    PAF (paroxysmal atrial fibrillation) (HCC)    a. 06/2019 Event monitor: PAF <1% burden. Longest 3 mins 36 secs.   Productive cough    PSVT (paroxysmal supraventricular tachycardia)    a. 06/2019 Event monitor: 112 episodes of SVT, longest 21 secs.   PVD (peripheral vascular disease) with claudication    a. 03/2014 LE art duplex: long segment occlusion of mid to distal R SFA; b. 03/2020 ABI: nl left and mildly improved R ABI->med rx.   Renal artery stenosis    Renal artery stenosis    a. 12/2020 Renal art duplex: RRA 1-59%, LRA >60%.   Smokers' cough  (HCC)    Type 2 diabetes mellitus with insulin  therapy (HCC) 1992   monitor by  dr ellsion   Wears dentures    upper    Assessment: Patient Reported Symptoms:  Cognitive Cognitive Status: Alert and oriented to person, place, and time, Normal speech and language skills, Poor judgment in daily scenarios Cognitive/Intellectual Conditions Management [RPT]: None reported or documented in medical history or problem list   Health Maintenance Behaviors: Annual physical exam Healing Pattern: Slow Health Facilitated by: Pain control, Stress management  Neurological Neurological Review of Symptoms: No symptoms reported    HEENT HEENT Symptoms Reported: Change or loss of hearing (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, 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[employer])      Cardiovascular Cardiovascular Symptoms Reported: No symptoms reported    Respiratory Respiratory Symptoms Reported: Shortness of breath, Productive cough Other Respiratory Symptoms: Patient states that he has COPD-was given an inhaler Respiratory Management Strategies: Medication therapy  Endocrine Endocrine Symptoms Reported: No symptoms reported Is patient diabetic?: Yes Is patient checking blood sugars at home?: No Endocrine Self-Management Outcome: 3 (uncertain) Endocrine Comment: on a program that pays for insulin -has to pick up medication at endocrinologist  Gastrointestinal Gastrointestinal Symptoms Reported: No symptoms reported      Genitourinary Genitourinary Symptoms Reported: No symptoms reported  Integumentary Integumentary Symptoms Reported: No symptoms reported    Musculoskeletal Musculoskelatal Symptoms Reviewed: Limited mobility Additional Musculoskeletal Details: Per patient he has 2 toes amputated, has a cain and walker but walks independently curently, had a fall after discharge from the hospital and per patient, broke 2 -3 ribs, never went back to the hospital seen by NP on 06/17/24 Musculoskeletal Management Strategies:  Adequate rest Falls in the past year?: Yes Number of falls in past year: 2 or more Was there an injury with Fall?: Yes Fall Risk Category Calculator: 3 Patient Fall Risk Level: High Fall Risk Patient at Risk for Falls Due to: History of fall(s), Impaired balance/gait, Impaired mobility Fall risk Follow up: Falls prevention discussed  Psychosocial Psychosocial Symptoms Reported: No symptoms reported Additional Psychological Details: spouse died 14-Jul-2023-denies need for grief counseling-no plumbing no electricity, has court date for forclosure on 07/06/24-has court appointed lawyer through legal aid- has a finanical payee through the Department of Social Services-patient left a message with payee-has not spoken to her, legal aid will plan to get in contact with payee for clarification-working on getting a reverse mortgage or may file for bancruptcy-bank has lien on property Behavioral Management Strategies: Coping strategies Behavioral Health Self-Management Outcome: 3 (uncertain) Major Change/Loss/Stressor/Fears (CP): Death of a loved one Behaviors When Feeling Stressed/Fearful: I have not grieved, plans on spreading her ashes on thanksgving day Techniques to Cope with Loss/Stress/Change: Spiritual practice(s) Quality of Family Relationships: non-existent Do you feel physically threatened by others?: No    07/06/2024    PHQ2-9 Depression Screening   Little interest or pleasure in doing things Not at all  Feeling down, depressed, or hopeless Not at all  PHQ-2 - Total Score 0  Trouble falling or staying asleep, or sleeping too much    Feeling tired or having little energy    Poor appetite or overeating     Feeling bad about yourself - or that you are a failure or have let yourself or your family down    Trouble concentrating on things, such as reading the newspaper or watching television    Moving or speaking so slowly that other people could have noticed.  Or the opposite - being so  fidgety or restless that you have been moving around a lot more than usual    Thoughts that you would be better off dead, or hurting yourself in some way    PHQ2-9 Total Score    If you checked off any problems, how difficult have these problems made it for you to do your work, take care of things at home, or get along with other people    Depression Interventions/Treatment      There were no vitals filed for this visit.    Medications Reviewed Today     Reviewed by Ermalinda Lenn HERO, LCSW (Social Worker) on 07/05/24 at 1138  Med List Status: <None>   Medication Order Taking? Sig Documenting Provider Last Dose Status Informant  albuterol  (VENTOLIN  HFA) 108 (90 Base) MCG/ACT inhaler [id number] Yes Inhale 2 puffs into the lungs every 4 (four) hours as needed for wheezing or shortness of breath. Ula Prentice SAUNDERS, MD  Active   atorvastatin  (LIPITOR ) 10 MG tablet [id number] Yes Take 10 mg by mouth daily. [provider]  Active   atorvastatin  (LIPITOR ) 40 MG tablet [id number] Yes TAKE 1 TABLET BY MOUTH ONCE DAILY Norleen Lynwood ORN, MD  Active   azithromycin  (ZITHROMAX ) 250 MG tablet [id number] Yes Take 1 tablet (250 mg total) by  mouth daily. Take first 2 tablets together, then 1 every day until finished. Ula Prentice SAUNDERS, MD  Active   Blood Glucose Monitoring Suppl (ACCU-CHEK GUIDE) w/Device KIT [id number] Yes 1 Device by Does not apply route daily in the afternoon. Shamleffer, Ibtehal Jaralla, MD  Active   citalopram  (CELEXA ) 20 MG tablet [id number] Yes Take 1 tablet (20 mg total) by mouth daily. Norleen Lynwood ORN, MD  Active   Continuous Glucose Sensor (DEXCOM G7 SENSOR) [place] [id number] Yes Change every 10 days Shamleffer, Ibtehal Jaralla, MD  Active   glucose blood (ACCU-CHEK GUIDE TEST) test strip [id number] Yes 1 each by Other route 3 (three) times daily. Use as instructed Shamleffer, Ibtehal Jaralla, MD  Active   insulin  glargine, 2 Unit Dial , (TOUJEO  MAX SOLOSTAR) 300 UNIT/ML Solostar Pen [id number]   Inject 40 Units into the skin every morning.  Patient not taking: Reported on 07/05/2024   Norleen Lynwood ORN, MD  Active   irbesartan  (AVAPRO ) 300 MG tablet [id number] Yes Take 1 tablet (300 mg total) by mouth daily. Norleen Lynwood ORN, MD  Active   pantoprazole  (PROTONIX ) 40 MG tablet [id number] Yes Take 1 tablet (40 mg total) by mouth daily. Norleen Lynwood ORN, MD  Active   predniSONE  (DELTASONE ) 50 MG tablet [id number] Yes Take 1 tablet by mouth daily Ula Prentice SAUNDERS, MD  Active             Recommendation:   PCP Follow-up Continue Current Plan of Care  Follow Up Plan:   Telephone follow up appointment date/time:  07/26/24  Lenn Mean, LCSW [place]  [facility], [facility] Licensed Clinical Social Worker  Direct Dial : [phone]

## 2024-07-06 NOTE — Patient Instructions (Signed)
 Visit Information  Thank you for taking time to visit with me today. Please don't hesitate to contact me if I can be of assistance to you before our next scheduled appointment.  Our next appointment is by telephone on 07/26/24 at 10:30am Please call the care guide team at [phone] if you need to cancel or reschedule your appointment.   Following is a copy of your care plan:   Goals Addressed             This Visit's Progress    VBCI Social Work Care Plan       Problems:   Housing   CSW Clinical Goal(s):   Over the next 90 days the Patient will work with Legal Aid to obtain legal representation  related to foreclosure evidenced by patient report             Over the next 30 days patient will work with his financial payee through the Department of Social Services for ongoing assistance/clarification regarding management of his finances  Interventions:  Encouraged follow up with legal aid-foreclosure court hearing 07/06/24             Encouraged follow up with financial payee to discuss finance management and possible assistance with electricity and water restoration-patient confirms having no water or electricity and water  Patient Goals/Self-Care Activities:  Patient will continue to work with legal aid for representation related to foreclosure             Patient will continue to work with his financial payee regarding finance management Plan:   Telephone follow up appointment with care management team member scheduled for:  07/26/24 10:30am        Please call the Suicide and Crisis Lifeline: 988 call the [place]  National Suicide Prevention Lifeline: (440) 719-1161 or TTY: 917-584-7985 TTY 773 022 3351) to talk to a trained counselor call 1-800-273-TALK (toll free, 24 hour hotline) call 911 if you are experiencing a Mental Health or Behavioral Health Crisis or need someone to talk to.  Patient verbalized understanding of Care plan and visit instructions communicated  this visit  Dajanique Robley, LCSW [place]  [facility], [facility] Licensed Clinical Social Worker  Direct Dial : [phone]

## 2024-07-13 ENCOUNTER — Emergency Department (HOSPITAL_COMMUNITY)
Admission: EM | Admit: 2024-07-13 | Discharge: 2024-07-13 | Disposition: A | Discharge status: Home / Self Care | Attending: Emergency Medicine | Admitting: Emergency Medicine

## 2024-07-13 ENCOUNTER — Emergency Department (HOSPITAL_COMMUNITY)

## 2024-07-13 ENCOUNTER — Other Ambulatory Visit: Payer: Self-pay

## 2024-07-13 DIAGNOSIS — Z79899 Other long term (current) drug therapy: Secondary | ICD-10-CM | POA: Insufficient documentation

## 2024-07-13 DIAGNOSIS — F17290 Nicotine dependence, other tobacco product, uncomplicated: Secondary | ICD-10-CM | POA: Insufficient documentation

## 2024-07-13 DIAGNOSIS — R0602 Shortness of breath: Secondary | ICD-10-CM | POA: Insufficient documentation

## 2024-07-13 DIAGNOSIS — R600 Localized edema: Secondary | ICD-10-CM | POA: Insufficient documentation

## 2024-07-13 LAB — COMPREHENSIVE METABOLIC PANEL WITH GFR
ALT: 14 U/L (ref 0–44)
AST: 16 U/L (ref 15–41)
Albumin: 2.3 g/dL — ABNORMAL LOW (ref 3.5–5.0)
Alkaline Phosphatase: 98 U/L (ref 38–126)
Anion gap: 12 (ref 5–15)
BUN: 9 mg/dL (ref 8–23)
CO2: 31 mmol/L (ref 22–32)
Calcium: 8.1 mg/dL — ABNORMAL LOW (ref 8.9–10.3)
Chloride: 98 mmol/L (ref 98–111)
Creatinine, Ser: 0.89 mg/dL (ref 0.61–1.24)
GFR, Estimated: >60 mL/min (ref >60)
Glucose, Bld: 174 mg/dL — ABNORMAL HIGH (ref 70–99)
Potassium: 2.8 mmol/L — ABNORMAL LOW (ref 3.5–5.1)
Sodium: 141 mmol/L (ref 135–145)
Total Bilirubin: 0.9 mg/dL (ref 0.0–1.2)
Total Protein: 5.7 g/dL — ABNORMAL LOW (ref 6.5–8.1)

## 2024-07-13 LAB — MAGNESIUM: Magnesium: 1.1 mg/dL — ABNORMAL LOW (ref 1.7–2.4)

## 2024-07-13 LAB — CBC WITH DIFFERENTIAL/PLATELET
Abs Immature Granulocytes: 0.03 K/uL (ref 0.00–0.07)
Basophils Absolute: 0.1 K/uL (ref 0.0–0.1)
Basophils Relative: 1 %
Eosinophils Absolute: 0.3 K/uL (ref 0.0–0.5)
Eosinophils Relative: 3 %
HCT: 43.3 % (ref 39.0–52.0)
Hemoglobin: 14.8 g/dL (ref 13.0–17.0)
Immature Granulocytes: 0 %
Lymphocytes Relative: 25 %
Lymphs Abs: 2.5 K/uL (ref 0.7–4.0)
MCH: 31.6 pg (ref 26.0–34.0)
MCHC: 34.2 g/dL (ref 30.0–36.0)
MCV: 92.5 fL (ref 80.0–100.0)
Monocytes Absolute: 0.8 K/uL (ref 0.1–1.0)
Monocytes Relative: 8 %
Neutro Abs: 6.4 K/uL (ref 1.7–7.7)
Neutrophils Relative %: 63 %
Platelets: 221 K/uL (ref 150–400)
RBC: 4.68 MIL/uL (ref 4.22–5.81)
RDW: 14.1 % (ref 11.5–15.5)
WBC: 10.1 K/uL (ref 4.0–10.5)
nRBC: 0 % (ref 0.0–0.2)

## 2024-07-13 LAB — BRAIN NATRIURETIC PEPTIDE: B Natriuretic Peptide: 801.9 pg/mL — ABNORMAL HIGH (ref 0.0–100.0)

## 2024-07-13 LAB — TROPONIN I (HIGH SENSITIVITY)
Troponin I (High Sensitivity): 97 ng/L — ABNORMAL HIGH (ref <18)
Troponin I (High Sensitivity): 114 ng/L (ref <18)

## 2024-07-13 MED ORDER — FUROSEMIDE 10 MG/ML IJ SOLN
40.0000 mg | Freq: Once | INTRAMUSCULAR | Status: AC
  Administered 2024-07-13: 40 mg via INTRAVENOUS
  Filled 2024-07-13: qty 4

## 2024-07-13 MED ORDER — POTASSIUM CHLORIDE CRYS ER 20 MEQ PO TBCR: 40.0000 meq | EXTENDED_RELEASE_TABLET | Freq: Every day | ORAL | 0 refills | Status: DC

## 2024-07-13 MED ORDER — IPRATROPIUM-ALBUTEROL 0.5-2.5 (3) MG/3ML IN SOLN
3.0000 mL | Freq: Once | RESPIRATORY_TRACT | Status: AC
  Administered 2024-07-13: 3 mL via RESPIRATORY_TRACT
  Filled 2024-07-13: qty 3

## 2024-07-13 MED ORDER — FUROSEMIDE 20 MG PO TABS: 20.0000 mg | ORAL_TABLET | Freq: Every day | ORAL | 0 refills | Status: DC

## 2024-07-13 MED ORDER — POTASSIUM CHLORIDE 20 MEQ PO PACK
60.0000 meq | PACK | Freq: Once | ORAL | Status: AC
  Administered 2024-07-13: 60 meq via ORAL
  Filled 2024-07-13: qty 3

## 2024-07-13 NOTE — ED Triage Notes (Addendum)
 Pt BIB EMS from home. Pt woke with SHOB, pt used inhaler without relief.  Pt had mechanical fall yesterday and complains of 6/10 headache, pt denies LOC and no thinners. A&Ox4.   Hx afib, HR 80-140, 96% RA, cbg 163, 162/80

## 2024-07-13 NOTE — ED Notes (Signed)
 Patient transported to CT

## 2024-07-13 NOTE — ED Provider Notes (Signed)
 [place] EMERGENCY DEPARTMENT AT [facility] Provider Note   CSN: [id number] Arrival date & time: 07/13/24  9795     Patient presents with: Shortness of Breath   [name] is a 68 y.o. male.   Patient presents to the emergency department for evaluation of shortness of breath.  Patient reports history of respiratory problems that he uses inhalers for.  He continues to cough.  Patient reports that he does feel short of breath most nights, uses an inhaler with improvement.  Tonight he did not improve with his inhaler.       Prior to Admission medications   Medication Sig Start Date End Date Taking? Authorizing Provider  furosemide (LASIX) 20 MG tablet Take 1 tablet (20 mg total) by mouth daily. 07/13/24  Yes Ova Meegan, Lonni PARAS, MD  potassium chloride  SA (KLOR-CON  M) 20 MEQ tablet Take 2 tablets (40 mEq total) by mouth daily. 07/13/24  Yes Ivis Henneman, Lonni PARAS, MD  albuterol  (VENTOLIN  HFA) 108 (90 Base) MCG/ACT inhaler Inhale 2 puffs into the lungs every 4 (four) hours as needed for wheezing or shortness of breath. 06/11/24   Ula Prentice SAUNDERS, MD  atorvastatin  (LIPITOR ) 10 MG tablet Take 10 mg by mouth daily.    [provider]  atorvastatin  (LIPITOR ) 40 MG tablet TAKE 1 TABLET BY MOUTH ONCE DAILY 05/31/24   Norleen Lynwood LELON, MD  azithromycin  (ZITHROMAX ) 250 MG tablet Take 1 tablet (250 mg total) by mouth daily. Take first 2 tablets together, then 1 every day until finished. 06/11/24   Ula Prentice SAUNDERS, MD  Blood Glucose Monitoring Suppl (ACCU-CHEK GUIDE) w/Device KIT 1 Device by Does not apply route daily in the afternoon. 04/05/24   Shamleffer, Ibtehal Jaralla, MD  citalopram  (CELEXA ) 20 MG tablet Take 1 tablet (20 mg total) by mouth daily. 05/31/24   Norleen Lynwood LELON, MD  Continuous Glucose Sensor (DEXCOM G7 SENSOR) MISC Change every 10 days 07/05/24   Shamleffer, Ibtehal Jaralla, MD  glucose blood (ACCU-CHEK GUIDE TEST) test strip 1 each by Other route 3 (three) times  daily. Use as instructed 04/05/24   Shamleffer, Ibtehal Jaralla, MD  insulin  glargine, 2 Unit Dial , (TOUJEO  MAX SOLOSTAR) 300 UNIT/ML Solostar Pen Inject 40 Units into the skin every morning. Patient not taking: Reported on 07/05/2024 05/31/24   Norleen Lynwood LELON, MD  irbesartan  (AVAPRO ) 300 MG tablet Take 1 tablet (300 mg total) by mouth daily. 05/31/24   Norleen Lynwood LELON, MD  pantoprazole  (PROTONIX ) 40 MG tablet Take 1 tablet (40 mg total) by mouth daily. 05/31/24   Norleen Lynwood LELON, MD  predniSONE  (DELTASONE ) 50 MG tablet Take 1 tablet by mouth daily 06/11/24   Ula Prentice SAUNDERS, MD    Allergies: Cefepime , Metoprolol , and Myrbetriq  [mirabegron ]    Review of Systems  Updated Vital Signs BP (!) 134/96   Pulse 71   Temp 98.3 F (36.8 C) (Oral)   Resp (!) 22   Ht 5' 11 (1.803 m)   Wt 68 kg   SpO2 99%   BMI 20.92 kg/m   Physical Exam Vitals and nursing note reviewed.  Constitutional:      General: He is not in acute distress.    Appearance: He is well-developed.  HENT:     Head: Normocephalic and atraumatic.     Mouth/Throat:     Mouth: Mucous membranes are moist.  Eyes:     General: Vision grossly intact. Gaze aligned appropriately.     Extraocular Movements: Extraocular movements intact.  Conjunctiva/sclera: Conjunctivae normal.  Cardiovascular:     Rate and Rhythm: Normal rate and regular rhythm.     Pulses: Normal pulses.     Heart sounds: Normal heart sounds, S1 normal and S2 normal. No murmur heard.    No friction rub. No gallop.  Pulmonary:     Effort: Pulmonary effort is normal. No respiratory distress.     Breath sounds: Decreased breath sounds present.  Abdominal:     Palpations: Abdomen is soft.     Tenderness: There is no abdominal tenderness. There is no guarding or rebound.     Hernia: No hernia is present.  Musculoskeletal:        General: No swelling.     Cervical back: Full passive range of motion without pain, normal range of motion and neck supple. No pain with  movement, spinous process tenderness or muscular tenderness. Normal range of motion.     Right lower leg: Edema present.     Left lower leg: Edema present.  Skin:    General: Skin is warm and dry.     Capillary Refill: Capillary refill takes less than 2 seconds.     Findings: No ecchymosis, erythema, lesion or wound.  Neurological:     Mental Status: He is alert and oriented to person, place, and time.     GCS: GCS eye subscore is 4. GCS verbal subscore is 5. GCS motor subscore is 6.     Cranial Nerves: Cranial nerves 2-12 are intact.     Sensory: Sensation is intact.     Motor: Motor function is intact. No weakness or abnormal muscle tone.     Coordination: Coordination is intact.  Psychiatric:        Mood and Affect: Mood normal.        Speech: Speech normal.        Behavior: Behavior normal.     (all labs ordered are listed, but only abnormal results are displayed) Labs Reviewed  COMPREHENSIVE METABOLIC PANEL WITH GFR - Abnormal; Notable for the following components:      Result Value   Potassium 2.8 (*)    Glucose, Bld 174 (*)    Calcium  8.1 (*)    Total Protein 5.7 (*)    Albumin 2.3 (*)    All other components within normal limits  BRAIN NATRIURETIC PEPTIDE - Abnormal; Notable for the following components:   B Natriuretic Peptide 801.9 (*)    All other components within normal limits  MAGNESIUM  - Abnormal; Notable for the following components:   Magnesium  1.1 (*)    All other components within normal limits  TROPONIN I (HIGH SENSITIVITY) - Abnormal; Notable for the following components:   Troponin I (High Sensitivity) 97 (*)    All other components within normal limits  TROPONIN I (HIGH SENSITIVITY) - Abnormal; Notable for the following components:   Troponin I (High Sensitivity) 114 (*)    All other components within normal limits  CBC WITH DIFFERENTIAL/PLATELET    EKG: EKG Interpretation Date/Time:  Wednesday July 13 2024 02:10:17 EST Ventricular Rate:   113 PR Interval:  128 QRS Duration:  83 QT Interval:  318 QTC Calculation: 436 R Axis:   59  Text Interpretation: Sinus tachycardia Multiform ventricular premature complexes Anterior infarct, old Nonspecific T abnormalities, lateral leads Confirmed by Haze Lonni PARAS [phone]) on 07/13/2024 3:34:16 AM  Radiology: CT HEAD WO CONTRAST ( ) Result Date: 07/13/2024 EXAM: CT HEAD WITHOUT CONTRAST 07/13/2024 03:20:41 AM TECHNIQUE: CT of the head  was performed without the administration of intravenous contrast. Automated exposure control, iterative reconstruction, and/or weight based adjustment of the mA/kV was utilized to reduce the radiation dose to as low as reasonably achievable. COMPARISON: None available. CLINICAL HISTORY: Head trauma, minor (Age >= 65y). FINDINGS: BRAIN AND VENTRICLES: No acute hemorrhage. No evidence of acute infarct. No hydrocephalus. No extra-axial collection. No mass effect or midline shift. Chronic microvascular ischemia and generalized atrophy. ORBITS: No acute abnormality. SINUSES: Mucosal thickening of the left greater than right maxillary sinuses with air-fluid level in the left maxillary sinus. Mucosal thickening in the ethmoid air cells. SOFT TISSUES AND SKULL: No acute soft tissue abnormality. No skull fracture. IMPRESSION: 1. No acute intracranial abnormality. 2. Paranasal sinus disease with air-fluid level in the left maxillary sinus. Correlate for acute sinusitis. Electronically signed by: Norman Gatlin MD 07/13/2024 03:25 AM EST RP Workstation: [id number]   DG Chest Port 1 View Result Date: 07/13/2024 EXAM: 1 VIEW(S) XRAY OF THE CHEST 07/13/2024 02:45:00 AM COMPARISON: 06/10/2024 CLINICAL HISTORY: shortness of breath FINDINGS: LUNGS AND PLEURA: Left pleural effusion. Associated left basilar compressive atelectasis. No pneumothorax. HEART AND MEDIASTINUM: Borderline cardiomegaly. Aortic arch atherosclerosis. BONES AND SOFT TISSUES: Acute left seventh rib fracture  laterally. Old left clavicle fracture. IMPRESSION: 1. Left pleural effusion with associated left basilar atelectasis. 2. Acute left seventh lateral rib fracture. No pneumothorax. Electronically signed by: Dorethia Molt MD 07/13/2024 02:54 AM EST RP Workstation: [id number]     Procedures   Medications Ordered in the ED  potassium chloride  (KLOR-CON ) packet 60 mEq (60 mEq Oral Given 07/13/24 0434)  furosemide (LASIX) injection 40 mg (40 mg Intravenous Given 07/13/24 0434)                                    Medical Decision Making Amount and/or Complexity of Data Reviewed External Data Reviewed: labs, radiology, ECG and notes. Labs: ordered. Decision-making details documented in ED Course. Radiology: ordered and independent interpretation performed. Decision-making details documented in ED Course. ECG/medicine tests: ordered and independent interpretation performed. Decision-making details documented in ED Course.  Risk Prescription drug management.   Differential Diagnosis considered includes, but not limited to: COPD exacerbation; Bronchitis; Pneumonia; CHF; ACS; PE  Presents to the emergency department with increasing shortness of breath.  Patient is a heavy smoker, likely has some element of COPD.  He uses inhaler daily, did not improve tonight.  At arrival he does not have any significant bronchospasm.  He is, however, noted to have moderate pitting edema of both lower extremities.  I did perform a chart review, patient has history of normal ejection fraction, elevated left ventricular end-diastolic pressure/impaired relaxation of the left ventricle -grade 2 diastolic dysfunction.  Patient's BNP is elevated today.  X-ray with left pleural effusion, likely some element of volume overload causing some of his respiratory symptoms.  Patient does have mildly elevated troponins.  He does appear to have some chronic elevation of the troponins, pattern consistent with volume overload, not  ACS.  No complaints of chest pain.  Will diurese.  Patient reports a fall yesterday, did hit his head on the wall.  No loss of consciousness.  CT head unremarkable.  Patient does have what appears to be an acute left seventh rib fracture without contusion or pneumothorax as a result of this fall.     Final diagnoses:  SOB (shortness of breath)    ED Discharge Orders  Ordered    furosemide (LASIX) 20 MG tablet  Daily        07/13/24 0602    potassium chloride  SA (KLOR-CON  M) 20 MEQ tablet  Daily        07/13/24 0602               Haze Lonni PARAS, MD 07/13/24 [phone]

## 2024-07-13 NOTE — ED Notes (Signed)
 CALLED [facility] ETA 20 MINUTE

## 2024-07-22 ENCOUNTER — Inpatient Hospital Stay: Admitting: Internal Medicine

## 2024-07-22 DIAGNOSIS — I340 Nonrheumatic mitral (valve) insufficiency: Secondary | ICD-10-CM | POA: Diagnosis not present

## 2024-07-22 DIAGNOSIS — J90 Pleural effusion, not elsewhere classified: Secondary | ICD-10-CM | POA: Diagnosis not present

## 2024-07-23 DIAGNOSIS — I4719 Other supraventricular tachycardia: Secondary | ICD-10-CM | POA: Diagnosis not present

## 2024-07-23 DIAGNOSIS — I10 Essential (primary) hypertension: Secondary | ICD-10-CM | POA: Diagnosis not present

## 2024-07-23 DIAGNOSIS — E785 Hyperlipidemia, unspecified: Secondary | ICD-10-CM | POA: Diagnosis not present

## 2024-07-23 DIAGNOSIS — I480 Paroxysmal atrial fibrillation: Secondary | ICD-10-CM | POA: Diagnosis not present

## 2024-07-23 DIAGNOSIS — I2510 Atherosclerotic heart disease of native coronary artery without angina pectoris: Secondary | ICD-10-CM | POA: Diagnosis not present

## 2024-07-23 DIAGNOSIS — I509 Heart failure, unspecified: Secondary | ICD-10-CM | POA: Diagnosis not present

## 2024-07-24 DIAGNOSIS — I509 Heart failure, unspecified: Secondary | ICD-10-CM | POA: Diagnosis not present

## 2024-07-24 DIAGNOSIS — I480 Paroxysmal atrial fibrillation: Secondary | ICD-10-CM | POA: Diagnosis not present

## 2024-07-24 DIAGNOSIS — I4719 Other supraventricular tachycardia: Secondary | ICD-10-CM | POA: Diagnosis not present

## 2024-07-24 DIAGNOSIS — I2510 Atherosclerotic heart disease of native coronary artery without angina pectoris: Secondary | ICD-10-CM | POA: Diagnosis not present

## 2024-07-25 DIAGNOSIS — I2510 Atherosclerotic heart disease of native coronary artery without angina pectoris: Secondary | ICD-10-CM | POA: Diagnosis not present

## 2024-07-25 DIAGNOSIS — I480 Paroxysmal atrial fibrillation: Secondary | ICD-10-CM | POA: Diagnosis not present

## 2024-07-25 DIAGNOSIS — I509 Heart failure, unspecified: Secondary | ICD-10-CM | POA: Diagnosis not present

## 2024-07-25 DIAGNOSIS — I4719 Other supraventricular tachycardia: Secondary | ICD-10-CM | POA: Diagnosis not present

## 2024-07-26 ENCOUNTER — Other Ambulatory Visit: Payer: Self-pay | Admitting: *Deleted

## 2024-07-26 DIAGNOSIS — I4891 Unspecified atrial fibrillation: Secondary | ICD-10-CM | POA: Diagnosis not present

## 2024-07-26 DIAGNOSIS — R9431 Abnormal electrocardiogram [ECG] [EKG]: Secondary | ICD-10-CM | POA: Diagnosis not present

## 2024-07-26 NOTE — Patient Outreach (Signed)
 Patient contacted today for scheduled appointment and was informed by patient that he has been hospitalized. Patient is currently admitted to [facility] with plan to go to rehab. Patient placed on pause at this time.  Reilley Valentine, LCSW   [facility], [facility] Licensed Clinical Social Worker  Direct Dial : [phone]

## 2024-07-29 ENCOUNTER — Telehealth: Payer: Self-pay

## 2024-07-29 NOTE — Telephone Encounter (Signed)
 Copied from CRM #[id number]. Topic: General - Other >> Jul 29, 2024  1:33 PM Robinson H wrote: Reason for CRM: Ann with [facility] calling to verify if patient is current with Dr. Norleen and if home health services are opened will Dr. Norleen sign orders for patient.  [facility] Intake Coordinator [phone]

## 2024-07-29 NOTE — Telephone Encounter (Signed)
 Ok yes, I can sign orders, thanks

## 2024-08-01 ENCOUNTER — Other Ambulatory Visit (HOSPITAL_COMMUNITY): Payer: Self-pay

## 2024-08-01 ENCOUNTER — Telehealth: Payer: Self-pay | Admitting: *Deleted

## 2024-08-01 NOTE — Transitions of Care (Post Inpatient/ED Visit) (Signed)
" ° °  08/01/2024  Name: [name] MRN: [id number] DOB: [birth year]  Today's TOC FU Call Status: Today's TOC FU Call Status:: Unsuccessful Call (1st Attempt) Unsuccessful Call (1st Attempt) Date: 08/01/24  Attempted to reach the patient regarding the most recent Inpatient visit.  Received automated outgoing voice message stating that the person you are trying to call has a voice mail box that has not been set up; please hang up and try your call again later; unable to leave voice message requesting call back   Follow Up Plan: Additional outreach attempts will be made to reach the patient to complete the Transitions of Care (Post Inpatient/ED visit) call.   Pls call/ message for questions,  Mystie Ormand Mckinney Terik Haughey, RN, BSN, CCRN Alumnus RN Care Manager  Transitions of Care  [facility] - [facility] [phone]: direct office  "

## 2024-08-02 ENCOUNTER — Telehealth: Payer: Self-pay | Admitting: *Deleted
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
## 2024-08-03 ENCOUNTER — Telehealth: Payer: Self-pay
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
## 2024-08-09 ENCOUNTER — Other Ambulatory Visit: Payer: Self-pay | Admitting: Podiatry

## 2024-08-09 ENCOUNTER — Ambulatory Visit: Payer: Self-pay | Admitting: Internal Medicine

## 2024-08-09 ENCOUNTER — Telehealth: Payer: Self-pay | Admitting: Podiatry

## 2024-08-09 MED ORDER — DOXYCYCLINE HYCLATE 100 MG PO TABS: 100.0000 mg | ORAL_TABLET | Freq: Two times a day (BID) | ORAL | 0 refills | Status: DC

## 2024-08-09 NOTE — Telephone Encounter (Signed)
 Patient is requesting pain medication for left foot, previous amputation

## 2024-08-09 NOTE — Telephone Encounter (Signed)
 We normally dont do this, but since he has no way to be seen, I will call in antibx, but he should call 911 to go to ED if any worsening

## 2024-08-09 NOTE — Telephone Encounter (Signed)
 Patient called back in to inform us  that his left foot is oozing some red. He states his phone is charged and back on, and can receive a call at this number: [phone]. He states he will call [facility] as well regarding symptoms. Advised patient to call back for any new or worsening symptoms.

## 2024-08-09 NOTE — Telephone Encounter (Signed)
 Patient would like to know if he can receive pain medication as soon as possible before scheduling an appointment at this time

## 2024-08-09 NOTE — Telephone Encounter (Signed)
 FYI Only or Action Required?: Action required by provider: patient is requesting medication be sent for delivery .  Patient was last seen in primary care on 06/17/2024 by Paul Corean CROME, FNP.  Called Nurse Triage reporting Foot Pain.  Symptoms began several days ago.  Interventions attempted: Prescription medications: neighbors prescription medication pregabalin .  Symptoms are: stable.  Triage Disposition: See HCP Within 4 Hours (Or PCP Triage)  Patient/caregiver understands and will follow disposition?: No, wishes to speak with PCP          Reason for Disposition  [1] SEVERE pain ([name].g., excruciating, unable to do any normal activities) AND [2] not improved after 2 hours of pain medicine  Answer Assessment - Initial Assessment Questions 2 months of pain in left foot. Hasn't been able to sleep. Tried to call foot doctor but was holiday. 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 Recently found out 30% working of heart [facility]. Preglabin took this neighbor gave it foot didn't hurt and went to sleep  pain level 8/10.  Patient advised not to take medication as its not his and states he is going to take it. Was 10/10 on christmas. Top of left foot was red went away when took medication.taking tylenol  for pain didn't help but tylenol  PM did. Pain is heel to front of foot. Not able to get to office or urgent care no transport.  Patient requesting medication for this sent for delivery through [facility]   this RN did reviewed chart telephone 08/03/2024    1. ONSET: When did the pain start?      2 months  2. LOCATION: Where is the pain located?      Left foot heel to front of foot  3. PAIN: How bad is the pain?    (Scale 1-10; or mild, moderate, severe)     8/10 severe   5. CAUSE: What do you think is causing the foot pain?     Had amputation toes in June  6. OTHER SYMPTOMS: Do you have any other symptoms? ([name].g., leg pain, rash, fever,  numbness)     Portable norleen can reach with hand , that is far as can go for toliet. Limping when walking due to severe pain able to walk with cane   Patient denies the following denies chest pain, shortness of breath, fever  Protocols used: Foot Pain-A-AH Copied from CRM [id number]. Topic: Clinical - Red Word Triage >> Aug 09, 2024 12:07 PM [name] wrote: [facility] that prompted transfer to Nurse Triage: Pain in left foot, patient had two toes amputated in June and he has been limping around in pain. Stated the pain level is 8 out of 10 today.

## 2024-08-10 ENCOUNTER — Emergency Department

## 2024-08-10 ENCOUNTER — Inpatient Hospital Stay
Admission: EM | Admit: 2024-08-10 | Discharge: 2024-08-22 | DRG: 270 | Disposition: A | Discharge status: Home / Self Care

## 2024-08-10 ENCOUNTER — Ambulatory Visit: Admitting: Podiatry

## 2024-08-10 ENCOUNTER — Other Ambulatory Visit: Payer: Self-pay

## 2024-08-10 DIAGNOSIS — B952 Enterococcus as the cause of diseases classified elsewhere: Secondary | ICD-10-CM | POA: Diagnosis present

## 2024-08-10 DIAGNOSIS — I25119 Atherosclerotic heart disease of native coronary artery with unspecified angina pectoris: Secondary | ICD-10-CM | POA: Diagnosis present

## 2024-08-10 DIAGNOSIS — M869 Osteomyelitis, unspecified: Secondary | ICD-10-CM

## 2024-08-10 DIAGNOSIS — Z01810 Encounter for preprocedural cardiovascular examination: Secondary | ICD-10-CM | POA: Diagnosis not present

## 2024-08-10 DIAGNOSIS — I70245 Atherosclerosis of native arteries of left leg with ulceration of other part of foot: Secondary | ICD-10-CM | POA: Diagnosis not present

## 2024-08-10 DIAGNOSIS — F17210 Nicotine dependence, cigarettes, uncomplicated: Secondary | ICD-10-CM | POA: Diagnosis present

## 2024-08-10 DIAGNOSIS — E785 Hyperlipidemia, unspecified: Secondary | ICD-10-CM | POA: Diagnosis present

## 2024-08-10 DIAGNOSIS — Z59819 Housing instability, housed unspecified: Secondary | ICD-10-CM | POA: Diagnosis present

## 2024-08-10 DIAGNOSIS — Z6820 Body mass index (BMI) 20.0-20.9, adult: Secondary | ICD-10-CM

## 2024-08-10 DIAGNOSIS — F17200 Nicotine dependence, unspecified, uncomplicated: Secondary | ICD-10-CM | POA: Diagnosis not present

## 2024-08-10 DIAGNOSIS — Z716 Tobacco abuse counseling: Secondary | ICD-10-CM

## 2024-08-10 DIAGNOSIS — I739 Peripheral vascular disease, unspecified: Secondary | ICD-10-CM | POA: Diagnosis present

## 2024-08-10 DIAGNOSIS — Z5941 Food insecurity: Secondary | ICD-10-CM

## 2024-08-10 DIAGNOSIS — Z860101 Personal history of adenomatous and serrated colon polyps: Secondary | ICD-10-CM

## 2024-08-10 DIAGNOSIS — E1140 Type 2 diabetes mellitus with diabetic neuropathy, unspecified: Secondary | ICD-10-CM | POA: Diagnosis present

## 2024-08-10 DIAGNOSIS — I708 Atherosclerosis of other arteries: Secondary | ICD-10-CM | POA: Diagnosis not present

## 2024-08-10 DIAGNOSIS — L97929 Non-pressure chronic ulcer of unspecified part of left lower leg with unspecified severity: Secondary | ICD-10-CM | POA: Diagnosis not present

## 2024-08-10 DIAGNOSIS — Z59868 Other specified financial insecurity: Secondary | ICD-10-CM

## 2024-08-10 DIAGNOSIS — I5023 Acute on chronic systolic (congestive) heart failure: Secondary | ICD-10-CM | POA: Diagnosis present

## 2024-08-10 DIAGNOSIS — I96 Gangrene, not elsewhere classified: Secondary | ICD-10-CM | POA: Diagnosis not present

## 2024-08-10 DIAGNOSIS — Z5982 Transportation insecurity: Secondary | ICD-10-CM

## 2024-08-10 DIAGNOSIS — Z5912 Inadequate housing utilities: Secondary | ICD-10-CM

## 2024-08-10 DIAGNOSIS — I70249 Atherosclerosis of native arteries of left leg with ulceration of unspecified site: Secondary | ICD-10-CM | POA: Diagnosis not present

## 2024-08-10 DIAGNOSIS — L03116 Cellulitis of left lower limb: Secondary | ICD-10-CM | POA: Diagnosis present

## 2024-08-10 DIAGNOSIS — I480 Paroxysmal atrial fibrillation: Secondary | ICD-10-CM | POA: Diagnosis present

## 2024-08-10 DIAGNOSIS — Z89432 Acquired absence of left foot: Secondary | ICD-10-CM

## 2024-08-10 DIAGNOSIS — Z604 Social exclusion and rejection: Secondary | ICD-10-CM | POA: Diagnosis present

## 2024-08-10 DIAGNOSIS — Z888 Allergy status to other drugs, medicaments and biological substances status: Secondary | ICD-10-CM

## 2024-08-10 DIAGNOSIS — I701 Atherosclerosis of renal artery: Secondary | ICD-10-CM | POA: Diagnosis present

## 2024-08-10 DIAGNOSIS — G9349 Other encephalopathy: Secondary | ICD-10-CM | POA: Diagnosis present

## 2024-08-10 DIAGNOSIS — I10 Essential (primary) hypertension: Secondary | ICD-10-CM | POA: Diagnosis not present

## 2024-08-10 DIAGNOSIS — I70269 Atherosclerosis of native arteries of extremities with gangrene, unspecified extremity: Secondary | ICD-10-CM | POA: Diagnosis present

## 2024-08-10 DIAGNOSIS — Z89422 Acquired absence of other left toe(s): Secondary | ICD-10-CM

## 2024-08-10 DIAGNOSIS — Z833 Family history of diabetes mellitus: Secondary | ICD-10-CM

## 2024-08-10 DIAGNOSIS — E11621 Type 2 diabetes mellitus with foot ulcer: Secondary | ICD-10-CM | POA: Diagnosis present

## 2024-08-10 DIAGNOSIS — R296 Repeated falls: Secondary | ICD-10-CM | POA: Diagnosis present

## 2024-08-10 DIAGNOSIS — E11649 Type 2 diabetes mellitus with hypoglycemia without coma: Secondary | ICD-10-CM | POA: Diagnosis present

## 2024-08-10 DIAGNOSIS — W19XXXA Unspecified fall, initial encounter: Secondary | ICD-10-CM | POA: Diagnosis present

## 2024-08-10 DIAGNOSIS — I110 Hypertensive heart disease with heart failure: Secondary | ICD-10-CM | POA: Diagnosis present

## 2024-08-10 DIAGNOSIS — I272 Pulmonary hypertension, unspecified: Secondary | ICD-10-CM | POA: Diagnosis present

## 2024-08-10 DIAGNOSIS — Z7901 Long term (current) use of anticoagulants: Secondary | ICD-10-CM | POA: Diagnosis not present

## 2024-08-10 DIAGNOSIS — C679 Malignant neoplasm of bladder, unspecified: Secondary | ICD-10-CM | POA: Diagnosis present

## 2024-08-10 DIAGNOSIS — I5020 Unspecified systolic (congestive) heart failure: Secondary | ICD-10-CM

## 2024-08-10 DIAGNOSIS — J441 Chronic obstructive pulmonary disease with (acute) exacerbation: Secondary | ICD-10-CM | POA: Diagnosis present

## 2024-08-10 DIAGNOSIS — Z91199 Patient's noncompliance with other medical treatment and regimen due to unspecified reason: Secondary | ICD-10-CM

## 2024-08-10 DIAGNOSIS — Z8551 Personal history of malignant neoplasm of bladder: Secondary | ICD-10-CM

## 2024-08-10 DIAGNOSIS — Z794 Long term (current) use of insulin: Secondary | ICD-10-CM

## 2024-08-10 DIAGNOSIS — E43 Unspecified severe protein-calorie malnutrition: Secondary | ICD-10-CM | POA: Diagnosis present

## 2024-08-10 DIAGNOSIS — Z7952 Long term (current) use of systemic steroids: Secondary | ICD-10-CM

## 2024-08-10 DIAGNOSIS — I2510 Atherosclerotic heart disease of native coronary artery without angina pectoris: Secondary | ICD-10-CM

## 2024-08-10 DIAGNOSIS — Z9889 Other specified postprocedural states: Secondary | ICD-10-CM | POA: Diagnosis not present

## 2024-08-10 DIAGNOSIS — Z5989 Other problems related to housing and economic circumstances: Secondary | ICD-10-CM

## 2024-08-10 DIAGNOSIS — T82868A Thrombosis of vascular prosthetic devices, implants and grafts, initial encounter: Secondary | ICD-10-CM | POA: Diagnosis not present

## 2024-08-10 DIAGNOSIS — F17290 Nicotine dependence, other tobacco product, uncomplicated: Secondary | ICD-10-CM | POA: Diagnosis present

## 2024-08-10 DIAGNOSIS — Z1621 Resistance to vancomycin: Secondary | ICD-10-CM | POA: Diagnosis present

## 2024-08-10 DIAGNOSIS — L97529 Non-pressure chronic ulcer of other part of left foot with unspecified severity: Secondary | ICD-10-CM | POA: Diagnosis present

## 2024-08-10 DIAGNOSIS — F32A Depression, unspecified: Secondary | ICD-10-CM | POA: Diagnosis present

## 2024-08-10 DIAGNOSIS — J309 Allergic rhinitis, unspecified: Secondary | ICD-10-CM | POA: Diagnosis present

## 2024-08-10 DIAGNOSIS — Z79899 Other long term (current) drug therapy: Secondary | ICD-10-CM

## 2024-08-10 DIAGNOSIS — Z5948 Other specified lack of adequate food: Secondary | ICD-10-CM

## 2024-08-10 DIAGNOSIS — E1152 Type 2 diabetes mellitus with diabetic peripheral angiopathy with gangrene: Principal | ICD-10-CM | POA: Diagnosis present

## 2024-08-10 DIAGNOSIS — L089 Local infection of the skin and subcutaneous tissue, unspecified: Secondary | ICD-10-CM | POA: Diagnosis not present

## 2024-08-10 DIAGNOSIS — M86172 Other acute osteomyelitis, left ankle and foot: Secondary | ICD-10-CM | POA: Diagnosis not present

## 2024-08-10 DIAGNOSIS — T82858A Stenosis of vascular prosthetic devices, implants and grafts, initial encounter: Secondary | ICD-10-CM | POA: Diagnosis not present

## 2024-08-10 DIAGNOSIS — Z8249 Family history of ischemic heart disease and other diseases of the circulatory system: Secondary | ICD-10-CM

## 2024-08-10 DIAGNOSIS — Z961 Presence of intraocular lens: Secondary | ICD-10-CM | POA: Diagnosis present

## 2024-08-10 LAB — CBC WITH DIFFERENTIAL/PLATELET
Abs Immature Granulocytes: 0.09 K/uL — ABNORMAL HIGH (ref 0.00–0.07)
Basophils Absolute: 0.1 K/uL (ref 0.0–0.1)
Basophils Relative: 1 %
Eosinophils Absolute: 0.2 K/uL (ref 0.0–0.5)
Eosinophils Relative: 1 %
HCT: 42.8 % (ref 39.0–52.0)
Hemoglobin: 15.1 g/dL (ref 13.0–17.0)
Immature Granulocytes: 1 %
Lymphocytes Relative: 9 %
Lymphs Abs: 1.7 K/uL (ref 0.7–4.0)
MCH: 32.5 pg (ref 26.0–34.0)
MCHC: 35.3 g/dL (ref 30.0–36.0)
MCV: 92.2 fL (ref 80.0–100.0)
Monocytes Absolute: 1 K/uL (ref 0.1–1.0)
Monocytes Relative: 5 %
Neutro Abs: 16.4 K/uL — ABNORMAL HIGH (ref 1.7–7.7)
Neutrophils Relative %: 83 %
Platelets: 308 K/uL (ref 150–400)
RBC: 4.64 MIL/uL (ref 4.22–5.81)
RDW: 14.1 % (ref 11.5–15.5)
WBC: 19.5 K/uL — ABNORMAL HIGH (ref 4.0–10.5)
nRBC: 0 % (ref 0.0–0.2)

## 2024-08-10 LAB — LACTIC ACID, PLASMA: Lactic Acid, Venous: 1.4 mmol/L (ref 0.5–1.9)

## 2024-08-10 LAB — COMPREHENSIVE METABOLIC PANEL WITH GFR
ALT: 15 U/L (ref 0–44)
AST: 22 U/L (ref 15–41)
Albumin: 3.1 g/dL — ABNORMAL LOW (ref 3.5–5.0)
Alkaline Phosphatase: 143 U/L — ABNORMAL HIGH (ref 38–126)
Anion gap: 10 (ref 5–15)
BUN: 11 mg/dL (ref 8–23)
CO2: 29 mmol/L (ref 22–32)
Calcium: 9 mg/dL (ref 8.9–10.3)
Chloride: 94 mmol/L — ABNORMAL LOW (ref 98–111)
Creatinine, Ser: 0.84 mg/dL (ref 0.61–1.24)
GFR, Estimated: >60 mL/min
Glucose, Bld: 141 mg/dL — ABNORMAL HIGH (ref 70–99)
Potassium: 3.5 mmol/L (ref 3.5–5.1)
Sodium: 134 mmol/L — ABNORMAL LOW (ref 135–145)
Total Bilirubin: 0.9 mg/dL (ref 0.0–1.2)
Total Protein: 7.2 g/dL (ref 6.5–8.1)

## 2024-08-10 LAB — PROTIME-INR
INR: 1.1 (ref 0.8–1.2)
Prothrombin Time: 14.8 s (ref 11.4–15.2)

## 2024-08-10 MED ORDER — HYDROCODONE-ACETAMINOPHEN 5-325 MG PO TABS
1.0000 | ORAL_TABLET | Freq: Once | ORAL | Status: AC
  Administered 2024-08-10: 1 via ORAL
  Filled 2024-08-10: qty 1

## 2024-08-10 MED ORDER — MORPHINE SULFATE (PF) 4 MG/ML IV SOLN
4.0000 mg | Freq: Once | INTRAVENOUS | Status: AC
  Administered 2024-08-10: 4 mg via INTRAVENOUS
  Filled 2024-08-10: qty 1

## 2024-08-10 MED ORDER — PIPERACILLIN-TAZOBACTAM 3.375 G IVPB 30 MIN
3.3750 g | Freq: Once | INTRAVENOUS | Status: AC
  Administered 2024-08-10: 3.375 g via INTRAVENOUS
  Filled 2024-08-10: qty 50

## 2024-08-10 MED ORDER — VANCOMYCIN HCL 1500 MG/300ML IV SOLN
1500.0000 mg | Freq: Once | INTRAVENOUS | Status: AC
  Administered 2024-08-11: 1500 mg via INTRAVENOUS
  Filled 2024-08-10: qty 300

## 2024-08-10 NOTE — Telephone Encounter (Signed)
 Spoke with Reena today in take.

## 2024-08-10 NOTE — Consult Note (Signed)
 PHARMACY - BRIEF ANTIBIOTIC NOTE   Pharmacy has received consult(s) for vancomycin  from an ED provider. The patient's profile has been reviewed for ht/wt/allergies/indication/available labs.    One time order(s) placed for: vancomycin  1500mg   Further antibiotics/pharmacy consults should be ordered by admitting physician if indicated.                       Thank you,  Will M. Lenon, PharmD, BCPS Clinical Pharmacist 08/10/2024 9:11 PM

## 2024-08-10 NOTE — Telephone Encounter (Signed)
 Attempted to reach patient but VM not set up.  If he calls back please inform him that antibiotic has been called in for him.
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
## 2024-08-10 NOTE — ED Notes (Signed)
 Pt to ultrasound

## 2024-08-10 NOTE — Progress Notes (Signed)
 ED Pharmacy Antibiotic Sign Off An antibiotic consult was received from an ED provider for Zosyn  per pharmacy dosing for wound infection. A chart review was completed to assess appropriateness.   The following one time order(s) were placed:  Zosyn  3.375 gm IV X 1 over 30 min   Further antibiotic and/or antibiotic pharmacy consults should be ordered by the admitting provider if indicated.   Thank you for allowing pharmacy to be a part of this patient's care.   Silver Selinda BIRCH [facility]  Clinical Pharmacist 08/10/2024 10:22 PM

## 2024-08-10 NOTE — ED Triage Notes (Signed)
 Pt arrives via [facility] from home with c/o fall pt stated that his legs got weak and gave out. Pt reports dizziness and leg pain in both legs. Pt stated that he hit his head. Pt on blood thinners.   160/80 87 94 RA 147 CBG

## 2024-08-10 NOTE — ED Notes (Signed)
 Ultrasound tech contacted this nurse to let me know she has taken pt to MRI

## 2024-08-11 ENCOUNTER — Inpatient Hospital Stay

## 2024-08-11 DIAGNOSIS — F17200 Nicotine dependence, unspecified, uncomplicated: Secondary | ICD-10-CM

## 2024-08-11 DIAGNOSIS — E11649 Type 2 diabetes mellitus with hypoglycemia without coma: Secondary | ICD-10-CM

## 2024-08-11 DIAGNOSIS — L03116 Cellulitis of left lower limb: Secondary | ICD-10-CM | POA: Diagnosis not present

## 2024-08-11 DIAGNOSIS — M86172 Other acute osteomyelitis, left ankle and foot: Secondary | ICD-10-CM

## 2024-08-11 DIAGNOSIS — I739 Peripheral vascular disease, unspecified: Secondary | ICD-10-CM

## 2024-08-11 DIAGNOSIS — Z794 Long term (current) use of insulin: Secondary | ICD-10-CM

## 2024-08-11 DIAGNOSIS — I10 Essential (primary) hypertension: Secondary | ICD-10-CM

## 2024-08-11 DIAGNOSIS — I480 Paroxysmal atrial fibrillation: Secondary | ICD-10-CM

## 2024-08-11 DIAGNOSIS — I2510 Atherosclerotic heart disease of native coronary artery without angina pectoris: Secondary | ICD-10-CM

## 2024-08-11 LAB — GLUCOSE, CAPILLARY
Glucose-Capillary: 126 mg/dL — ABNORMAL HIGH (ref 70–99)
Glucose-Capillary: 144 mg/dL — ABNORMAL HIGH (ref 70–99)
Glucose-Capillary: 166 mg/dL — ABNORMAL HIGH (ref 70–99)
Glucose-Capillary: 198 mg/dL — ABNORMAL HIGH (ref 70–99)
Glucose-Capillary: 230 mg/dL — ABNORMAL HIGH (ref 70–99)
Glucose-Capillary: 30 mg/dL — CL (ref 70–99)
Glucose-Capillary: 85 mg/dL (ref 70–99)

## 2024-08-11 LAB — APTT
aPTT: 40 s — ABNORMAL HIGH (ref 24–36)
aPTT: 43 s — ABNORMAL HIGH (ref 24–36)

## 2024-08-11 LAB — LACTIC ACID, PLASMA: Lactic Acid, Venous: 1.1 mmol/L (ref 0.5–1.9)

## 2024-08-11 LAB — HEPARIN LEVEL (UNFRACTIONATED)
Heparin Unfractionated: 0.19 [IU]/mL — ABNORMAL LOW (ref 0.30–0.70)
Heparin Unfractionated: 0.12 [IU]/mL — ABNORMAL LOW (ref 0.30–0.70)

## 2024-08-11 MED ORDER — MORPHINE SULFATE (PF) 2 MG/ML IV SOLN
2.0000 mg | INTRAVENOUS | Status: DC | PRN
  Administered 2024-08-12 – 2024-08-19 (×12): 2 mg via INTRAVENOUS
  Filled 2024-08-11 (×12): qty 1

## 2024-08-11 MED ORDER — VANCOMYCIN HCL IN DEXTROSE 1-5 GM/200ML-% IV SOLN
1000.0000 mg | Freq: Two times a day (BID) | INTRAVENOUS | Status: DC
  Filled 2024-08-11: qty 200

## 2024-08-11 MED ORDER — AMIODARONE HCL 200 MG PO TABS
200.0000 mg | ORAL_TABLET | Freq: Every day | ORAL | Status: DC
  Administered 2024-08-11 – 2024-08-22 (×12): 200 mg via ORAL
  Filled 2024-08-11 (×12): qty 1

## 2024-08-11 MED ORDER — FUROSEMIDE 40 MG PO TABS
40.0000 mg | ORAL_TABLET | Freq: Every day | ORAL | Status: DC
  Administered 2024-08-11 – 2024-08-22 (×12): 40 mg via ORAL
  Filled 2024-08-11: qty 2
  Filled 2024-08-11 (×6): qty 1
  Filled 2024-08-11: qty 2
  Filled 2024-08-11 (×4): qty 1

## 2024-08-11 MED ORDER — ALBUTEROL SULFATE (2.5 MG/3ML) 0.083% IN NEBU
2.5000 mg | INHALATION_SOLUTION | RESPIRATORY_TRACT | Status: DC | PRN
  Administered 2024-08-12: 2.5 mg via RESPIRATORY_TRACT
  Filled 2024-08-11: qty 3

## 2024-08-11 MED ORDER — HYDROCODONE-ACETAMINOPHEN 5-325 MG PO TABS
1.0000 | ORAL_TABLET | ORAL | Status: DC | PRN
  Administered 2024-08-11: 1 via ORAL
  Administered 2024-08-12 – 2024-08-14 (×7): 2 via ORAL
  Administered 2024-08-15: 1 via ORAL
  Administered 2024-08-15 – 2024-08-18 (×5): 2 via ORAL
  Administered 2024-08-19: 1 via ORAL
  Administered 2024-08-19 (×2): 2 via ORAL
  Administered 2024-08-21 (×2): 1 via ORAL
  Administered 2024-08-22: 2 via ORAL
  Filled 2024-08-11 (×6): qty 2
  Filled 2024-08-11: qty 1
  Filled 2024-08-11: qty 2
  Filled 2024-08-11: qty 1
  Filled 2024-08-11: qty 2
  Filled 2024-08-11 (×2): qty 1
  Filled 2024-08-11 (×9): qty 2

## 2024-08-11 MED ORDER — ONDANSETRON HCL 4 MG/2ML IJ SOLN: 4.0000 mg | Freq: Four times a day (QID) | INTRAMUSCULAR | Status: DC | PRN

## 2024-08-11 MED ORDER — PIPERACILLIN-TAZOBACTAM 3.375 G IVPB
3.3750 g | Freq: Three times a day (TID) | INTRAVENOUS | Status: DC
  Administered 2024-08-11 – 2024-08-22 (×34): 3.375 g via INTRAVENOUS
  Filled 2024-08-11 (×34): qty 50

## 2024-08-11 MED ORDER — SPIRONOLACTONE 25 MG PO TABS
25.0000 mg | ORAL_TABLET | Freq: Every day | ORAL | Status: DC
  Administered 2024-08-11 – 2024-08-22 (×11): 25 mg via ORAL
  Filled 2024-08-11 (×11): qty 1

## 2024-08-11 MED ORDER — VANCOMYCIN HCL 750 MG/150ML IV SOLN
750.0000 mg | Freq: Two times a day (BID) | INTRAVENOUS | Status: DC
  Administered 2024-08-11 – 2024-08-14 (×7): 750 mg via INTRAVENOUS
  Filled 2024-08-11 (×7): qty 150

## 2024-08-11 MED ORDER — INSULIN ASPART 100 UNIT/ML IJ SOLN
0.0000 [IU] | INTRAMUSCULAR | Status: DC
  Administered 2024-08-11: 2 [IU] via SUBCUTANEOUS
  Filled 2024-08-11: qty 2

## 2024-08-11 MED ORDER — ONDANSETRON HCL 4 MG PO TABS: 4.0000 mg | ORAL_TABLET | Freq: Four times a day (QID) | ORAL | Status: DC | PRN

## 2024-08-11 MED ORDER — PANTOPRAZOLE SODIUM 40 MG PO TBEC
40.0000 mg | DELAYED_RELEASE_TABLET | Freq: Every day | ORAL | Status: DC
  Administered 2024-08-11 – 2024-08-22 (×12): 40 mg via ORAL
  Filled 2024-08-11 (×12): qty 1

## 2024-08-11 MED ORDER — HEPARIN BOLUS VIA INFUSION
3000.0000 [IU] | Freq: Once | INTRAVENOUS | Status: AC
  Administered 2024-08-11: 3000 [IU] via INTRAVENOUS
  Filled 2024-08-11: qty 3000

## 2024-08-11 MED ORDER — CITALOPRAM HYDROBROMIDE 20 MG PO TABS
20.0000 mg | ORAL_TABLET | Freq: Every day | ORAL | Status: DC
  Administered 2024-08-11 – 2024-08-22 (×12): 20 mg via ORAL
  Filled 2024-08-11: qty 2
  Filled 2024-08-11: qty 1
  Filled 2024-08-11: qty 2
  Filled 2024-08-11: qty 1
  Filled 2024-08-11: qty 2
  Filled 2024-08-11 (×2): qty 1
  Filled 2024-08-11: qty 2
  Filled 2024-08-11: qty 1
  Filled 2024-08-11: qty 2
  Filled 2024-08-11: qty 1
  Filled 2024-08-11: qty 2

## 2024-08-11 MED ORDER — IRBESARTAN 150 MG PO TABS
300.0000 mg | ORAL_TABLET | Freq: Every day | ORAL | Status: DC
  Administered 2024-08-11 – 2024-08-22 (×11): 300 mg via ORAL
  Filled 2024-08-11 (×11): qty 2

## 2024-08-11 MED ORDER — ACETAMINOPHEN 650 MG RE SUPP: 650.0000 mg | Freq: Four times a day (QID) | RECTAL | Status: DC | PRN

## 2024-08-11 MED ORDER — DEXTROSE 50 % IV SOLN
25.0000 g | INTRAVENOUS | Status: AC
  Administered 2024-08-11: 25 g via INTRAVENOUS

## 2024-08-11 MED ORDER — HEPARIN (PORCINE) 25000 UT/250ML-% IV SOLN
2500.0000 [IU]/h | INTRAVENOUS | Status: DC
  Administered 2024-08-11: 1050 [IU]/h via INTRAVENOUS
  Administered 2024-08-12: 1350 [IU]/h via INTRAVENOUS
  Administered 2024-08-13: 2300 [IU]/h via INTRAVENOUS
  Administered 2024-08-13: 2000 [IU]/h via INTRAVENOUS
  Administered 2024-08-14 – 2024-08-15 (×4): 2400 [IU]/h via INTRAVENOUS
  Administered 2024-08-16: 2500 [IU]/h via INTRAVENOUS
  Filled 2024-08-11 (×10): qty 250

## 2024-08-11 MED ORDER — ATORVASTATIN CALCIUM 20 MG PO TABS
40.0000 mg | ORAL_TABLET | Freq: Every day | ORAL | Status: DC
  Administered 2024-08-11 – 2024-08-16 (×6): 40 mg via ORAL
  Filled 2024-08-11 (×6): qty 2

## 2024-08-11 MED ORDER — ACETAMINOPHEN 325 MG PO TABS: 650.0000 mg | ORAL_TABLET | Freq: Four times a day (QID) | ORAL | Status: DC | PRN

## 2024-08-11 NOTE — Consult Note (Signed)
 "                                   [facility] [place]  Patient name: [name] MRN: [id number] DOB: 09/11/1955 Sex: male   REQUESTING PROVIDER:    Dr Josette   REASON FOR CONSULT:    Left foot wound  HISTORY OF PRESENT ILLNESS:   [name] is a 69 y.o. male, who was admitted on 08/10/2024 with left leg cellulitis.  He has a history of left SFA stenting in July.  He has had trouble getting back to his follow-up appointments because of his lack of transportation and living arrangements.  In the ER he was febrile to 99.2 with a white count of 19,000.  Patient has a history of coronary artery disease, he is a diabetic he is a current smoker  PAST MEDICAL HISTORY    Past Medical History:  Diagnosis Date   Adenomatous colon polyp    Allergic rhinitis    At risk for sleep apnea    STOP-BANG= 5   SENT TO PCP 03-14-2014   Bladder cancer (HCC)    CAD (coronary artery disease)    a. 07/2014 low risk MV; b. 07/2019 Cor CTA (FFR): LM 25-49 (nl), LAD mild prox/mid plaque (nl), D1 25-49p(nl w/ abnl FFR of 0.73 in inf branch), LCX/OM1 mild prox/mid plaque (nl), RCA nondominant, minimal Ca2+ plaque (nl), RPDA (mildly abnl @ 0.79)-->Med Rx..   Cataract    surgically removed bilateral   Condyloma acuminatum of penis    Diabetic neuropathy (HCC)    Diastolic dysfunction    a. 05/2019 Echo: EF 60-65%, no rwam, mod LVH, impaired relaxation, nl RV size/fxn, trace MR, triv TR.   Diverticulosis    GERD (gastroesophageal reflux disease)    History of bladder cancer    s/p  turbt  2013/   transitional cell carcinoma--    History of condyloma acuminatum    PERINEAL AREA  W/ RECURRENCY   History of gout    Hyperlipidemia    Hypertension    Hypoxemia 03/09/2024   Lower urinary tract symptoms (LUTS)    PAF (paroxysmal atrial fibrillation) (HCC)    a. 06/2019 Event monitor: PAF <1% burden. Longest 3 mins 36 secs.   Productive cough    PSVT (paroxysmal  supraventricular tachycardia)    a. 06/2019 Event monitor: 112 episodes of SVT, longest 21 secs.   PVD (peripheral vascular disease) with claudication    a. 03/2014 LE art duplex: long segment occlusion of mid to distal R SFA; b. 03/2020 ABI: nl left and mildly improved R ABI->med rx.   Renal artery stenosis    Renal artery stenosis    a. 12/2020 Renal art duplex: RRA 1-59%, LRA >60%.   Smokers' cough (HCC)    Type 2 diabetes mellitus with insulin  therapy (HCC) 1992   monitor by  dr ellsion   Wears dentures    upper     FAMILY HISTORY   Family History  Problem Relation Age of Onset   Hypertension Mother    Lung cancer Father    Diabetes Maternal Aunt        x 2   Colon cancer Neg Hx    Esophageal cancer Neg Hx    Pancreatic cancer Neg Hx    Prostate cancer Neg Hx    Kidney disease Neg Hx    Liver disease  Neg Hx    Rectal cancer Neg Hx    Stomach cancer Neg Hx     SOCIAL HISTORY:   Social History   Socioeconomic History   Marital status: Widowed    Spouse name: Not on file   Number of children: 0   Years of education: Not on file   Highest education level: Not on file  Occupational History   Occupation: Disabled/retired  Tobacco Use   Smoking status: Every Day    Current packs/day: 0.00    Average packs/day: 1 pack/day for 41.0 years (41.0 ttl pk-yrs)    Types: Cigars, Cigarettes    Start date: 79    Last attempt to quit: 2017    Years since quitting: 9.0   Smokeless tobacco: Never   Tobacco comments:    Quit cigarettes in 2017 but started back smoking Cigars  Vaping Use   Vaping status: Former  Substance and Sexual Activity   Alcohol use: No    Alcohol/week: 0.0 standard drinks of alcohol   Drug use: No   Sexual activity: Not Currently  Other Topics Concern   Not on file  Social History Narrative   Not on file   Social Drivers of Health   Tobacco Use: High Risk (08/10/2024)   Patient History    Smoking Tobacco Use: Every Day    Smokeless Tobacco  Use: Never    Passive Exposure: Not on file  Financial Resource Strain: High Risk (03/11/2024)   Overall Financial Resource Strain (CARDIA)    Difficulty of Paying Living Expenses: Very hard  Food Insecurity: Food Insecurity Present (08/11/2024)   Epic    Worried About Programme Researcher, Broadcasting/film/video in the Last Year: Often true    Ran Out of Food in the Last Year: Often true  Transportation Needs: Unmet Transportation Needs (08/11/2024)   Epic    Lack of Transportation (Medical): Yes    Lack of Transportation (Non-Medical): Yes  Physical Activity: Inactive (06/10/2023)   Exercise Vital Sign    Days of Exercise per Week: 0 days    Minutes of Exercise per Session: 0 min  Stress: Stress Concern Present (03/11/2024)   [employer] of Occupational Health - Occupational Stress Questionnaire    Feeling of Stress: Very much  Social Connections: Socially Isolated (08/11/2024)   Social Connection and Isolation Panel    Frequency of Communication with Friends and Family: More than three times a week    Frequency of Social Gatherings with Friends and Family: Never    Attends Religious Services: Never    Database Administrator or Organizations: No    Attends Banker Meetings: Never    Marital Status: Widowed  Intimate Partner Violence: Not At Risk (08/11/2024)   Epic    Fear of Current or Ex-Partner: No    Emotionally Abused: No    Physically Abused: No    Sexually Abused: No  Depression (PHQ2-9): Low Risk (07/05/2024)   Depression (PHQ2-9)    PHQ-2 Score: 0  Alcohol Screen: Low Risk (06/10/2023)   Alcohol Screen    Last Alcohol Screening Score (AUDIT): 0  Housing: High Risk (08/11/2024)   Epic    Unable to Pay for Housing in the Last Year: Yes    Number of Times Moved in the Last Year: 0    Homeless in the Last Year: No  Utilities: At Risk (08/11/2024)   Epic    Threatened with loss of utilities: Yes  Health Literacy: Inadequate Health Literacy (02/17/2024)  B1300 Health Literacy     Frequency of need for help with medical instructions: Sometimes    ALLERGIES:    Allergies[1]  CURRENT MEDICATIONS:    Current Facility-Administered Medications  Medication Dose Route Frequency Provider Last Rate Last Admin   acetaminophen  (TYLENOL ) tablet 650 mg  650 mg Oral Q6H PRN Cleatus Delayne GAILS, MD       Or   acetaminophen  (TYLENOL ) suppository 650 mg  650 mg Rectal Q6H PRN Cleatus Delayne GAILS, MD       albuterol  (PROVENTIL ) (2.5 MG/3ML) 0.083% nebulizer solution 2.5 mg  2.5 mg Inhalation Q4H PRN Duncan, Hazel V, MD       amiodarone (PACERONE) tablet 200 mg  200 mg Oral Daily Duncan, Hazel V, MD   200 mg at 08/11/24 0944   atorvastatin  (LIPITOR ) tablet 40 mg  40 mg Oral Daily Duncan, Hazel V, MD   40 mg at 08/11/24 0944   citalopram  (CELEXA ) tablet 20 mg  20 mg Oral Daily Duncan, Hazel V, MD   20 mg at 08/11/24 0944   furosemide  (LASIX ) tablet 40 mg  40 mg Oral Daily Cleatus Delayne GAILS, MD   40 mg at 08/11/24 0943   heparin  ADULT infusion 100 units/mL (25000 units/250mL)  1,050 Units/hr Intravenous Continuous Josette Ade, MD 10.5 mL/hr at 08/11/24 0955 1,050 Units/hr at 08/11/24 0955   HYDROcodone -acetaminophen  (NORCO/VICODIN) 5-325 MG per tablet 1-2 tablet  1-2 tablet Oral Q4H PRN Duncan, Hazel V, MD       irbesartan  (AVAPRO ) tablet 300 mg  300 mg Oral Daily Cleatus Delayne GAILS, MD   300 mg at 08/11/24 9056   morphine  (PF) 2 MG/ML injection 2 mg  2 mg Intravenous Q2H PRN Cleatus Delayne GAILS, MD       ondansetron  (ZOFRAN ) tablet 4 mg  4 mg Oral Q6H PRN Cleatus Delayne GAILS, MD       Or   ondansetron  (ZOFRAN ) injection 4 mg  4 mg Intravenous Q6H PRN Cleatus Delayne GAILS, MD       pantoprazole  (PROTONIX ) EC tablet 40 mg  40 mg Oral Daily Duncan, Hazel V, MD   40 mg at 08/11/24 9056   piperacillin -tazobactam (ZOSYN ) IVPB 3.375 g  3.375 g Intravenous Q8H Cleatus Delayne V, MD 12.5 mL/hr at 08/11/24 0345 3.375 g at 08/11/24 0345   spironolactone  (ALDACTONE ) tablet 25 mg  25 mg Oral Daily Cleatus Delayne GAILS, MD    25 mg at 08/11/24 9056   vancomycin  (VANCOREADY) IVPB 750 mg/150 mL  750 mg Intravenous Q12H Wieting, Richard, MD        REVIEW OF SYSTEMS:   [X]  denotes positive finding, [ ]  denotes negative finding Cardiac  Comments:  Chest pain or chest pressure:    Shortness of breath upon exertion:    Short of breath when lying flat:    Irregular heart rhythm:        Vascular    Pain in calf, thigh, or hip brought on by ambulation:    Pain in feet at night that wakes you up from your sleep:     Blood clot in your veins:    Leg swelling:         Pulmonary    Oxygen at home:    Productive cough:     Wheezing:         Neurologic    Sudden weakness in arms or legs:     Sudden numbness in arms or legs:     Sudden onset of  difficulty speaking or slurred speech:    Temporary loss of vision in one eye:     Problems with dizziness:         Gastrointestinal    Blood in stool:      Vomited blood:         Genitourinary    Burning when urinating:     Blood in urine:        Psychiatric    Major depression:         Hematologic    Bleeding problems:    Problems with blood clotting too easily:        Skin    Rashes or ulcers:        Constitutional    Fever or chills:     PHYSICAL EXAM:   Vitals:   08/10/24 2150 08/11/24 0037 08/11/24 0437 08/11/24 0746  BP: (!) 143/69 (!) 149/93 (!) 154/87 (!) 149/77  Pulse: 72 76 67 63  Resp: 16 19 19 16   Temp: 99.2 F (37.3 C) 98.8 F (37.1 C) 98.4 F (36.9 C) (!) 97.5 F (36.4 C)  TempSrc: Oral   Oral  SpO2: 94% 95% 95% 95%  Weight:      Height:        GENERAL: The patient is a well-nourished male, in no acute distress. The vital signs are documented above. CARDIAC: There is a regular rate and rhythm.  PULMONARY: Nonlabored respirations ABDOMEN: Soft and non-tender  MUSCULOSKELETAL: There are no major deformities or cyanosis. NEUROLOGIC: No focal weakness or paresthesias are detected. SKIN: There are no ulcers or rashes  noted. PSYCHIATRIC: The patient has a normal affect.     STUDIES:   Venous studies were negative for DVT   ABIs 03/2024 ABI/TBIToday's ABIToday's TBI      Previous ABIPrevious TBI   +-------+-----------+-----------------+------------+-------------+  Right 0.61       0.36             0.55        dampened flow  +-------+-----------+-----------------+------------+-------------+  Left  1.12       no flow 2nd digit0.88        amputation     +-------+-----------+-----------------+------------+-------------+     ASSESSMENT and PLAN   PAD with ulceration of the left foot: Would ask podiatry to weigh in on the foot wounds.  He has previously undergone revascularization and has not been compliant with follow-ups.  Since he has had continued clinical deterioration of his foot I think he warrants having a repeat angiogram, which will be scheduled for Monday.  In the meantime he should continue with IV antibiotics and local wound care   Malvina New, IV, MD, FACS [facility] [phone] Pager [phone]     [1]  Allergies Allergen Reactions   Cefepime  Hives    Had allergic reaction to one of these 3 agents, unclear which one   Metoprolol  Hives    Had allergic reaction to one of these 3 agents, unclear which one  *Per RN, highly likely this is the cause of allergic rxn   Myrbetriq  [Mirabegron ] Hives    Had allergic reaction to one of these 3 agents, unclear which one   "

## 2024-08-11 NOTE — Assessment & Plan Note (Deleted)
 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  Dr. Lamount aware Will keep n.p.o. and hold Eliquis in case of procedure.   SCD for DVT prophylaxis

## 2024-08-11 NOTE — Plan of Care (Signed)
  Problem: Education: Goal: Knowledge of General Education information will improve Description: Including pain rating scale, medication(s)/side effects and non-pharmacologic comfort measures Outcome: Progressing   Problem: Health Behavior/Discharge Planning: Goal: Ability to manage health-related needs will improve Outcome: Progressing   Problem: Clinical Measurements: Goal: Ability to maintain clinical measurements within normal limits will improve Outcome: Progressing   Problem: Activity: Goal: Risk for activity intolerance will decrease Outcome: Progressing   Problem: Nutrition: Goal: Adequate nutrition will be maintained Outcome: Progressing   Problem: Coping: Goal: Level of anxiety will decrease Outcome: Progressing   Problem: Elimination: Goal: Will not experience complications related to bowel motility Outcome: Progressing   Problem: Pain Managment: Goal: General experience of comfort will improve and/or be controlled Outcome: Progressing

## 2024-08-11 NOTE — H&P (Signed)
 " History and Physical    Patient: [name] FMW:[id number] DOB: 1955/08/14 DOA: 08/10/2024 DOS: the patient was seen and examined on 08/11/2024 PCP: Norleen Lynwood LELON, MD  Patient coming from: Home  Chief Complaint:  Chief Complaint  Patient presents with   Fall    HPI: [name] is a 69 y.o. male with medical history significant for PAD, osteomyelitis s/p toe amputation, DM, HTN, bladder cancer, paroxysmal atrial fibrillation on Eliquis, , depression being admitted for cellulitis left foot with possible osteomyelitis.  He was admitted for the same in August 2025, undergoing amputation of the left great toe at that time, and he was treated with meropenem -> Cipro  to complete 4 weeks after OR cultures grew Pseudomonas, Enterobacter and Proteus.  Today he presented by EMS with pain and swelling of the left foot with redness as well as dizziness and weakness, which caused him to fall hitting his head on 1 occasion but without loss of consciousness.  He reports not having electricity in the house and cannot power his cell phone.  He decided to come into the ED to get checked out. In the ED, temp of 99.2 with otherwise normal vitals.  Labs notable for WBC 19,000 but otherwise unremarkable. EKG with no acute concerning findings Venous ultrasound negative for DVT Foot x-ray showed the following: IMPRESSION: 1. Soft tissue swelling along the plantar surface of the forefoot at the amputation site with subcutaneous gas. 2. Mild cortical irregularity along the medial margin of the remaining first metatarsal head, which could indicate osteomyelitis. 3. 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  The ED provider reached out to podiatrist, Dr.  Lamount who recommended Zosyn  and vancomycin  and get MRI.  No mention of OR Admission requested      Past Medical History:  Diagnosis Date   Adenomatous colon polyp    Allergic rhinitis    At risk for sleep apnea     STOP-BANG= 5   SENT TO PCP 03-14-2014   Bladder cancer (HCC)    CAD (coronary artery disease)    a. 07/2014 low risk MV; b. 07/2019 Cor CTA (FFR): LM 25-49 (nl), LAD mild prox/mid plaque (nl), D1 25-49p(nl w/ abnl FFR of 0.73 in inf branch), LCX/OM1 mild prox/mid plaque (nl), RCA nondominant, minimal Ca2+ plaque (nl), RPDA (mildly abnl @ 0.79)-->Med Rx..   Cataract    surgically removed bilateral   Condyloma acuminatum of penis    Diabetic neuropathy (HCC)    Diastolic dysfunction    a. 05/2019 Echo: EF 60-65%, no rwam, mod LVH, impaired relaxation, nl RV size/fxn, trace MR, triv TR.   Diverticulosis    GERD (gastroesophageal reflux disease)    History of bladder cancer    s/p  turbt  2013/   transitional cell carcinoma--    History of condyloma acuminatum    PERINEAL AREA  W/ RECURRENCY   History of gout    Hyperlipidemia    Hypertension    Hypoxemia 03/09/2024   Lower urinary tract symptoms (LUTS)    PAF (paroxysmal atrial fibrillation) (HCC)    a. 06/2019 Event monitor: PAF <1% burden. Longest 3 mins 36 secs.   Productive cough    PSVT (paroxysmal supraventricular tachycardia)    a. 06/2019 Event monitor: 112 episodes of SVT, longest 21 secs.   PVD (peripheral vascular disease) with claudication    a. 03/2014 LE art duplex: long segment occlusion of mid to distal R  SFA; b. 03/2020 ABI: nl left and mildly improved R ABI->med rx.   Renal artery stenosis    Renal artery stenosis    a. 12/2020 Renal art duplex: RRA 1-59%, LRA >60%.   Smokers' cough (HCC)    Type 2 diabetes mellitus with insulin  therapy [facility]) 1992   monitor by  dr ellsion   Wears dentures    upper   Past Surgical History:  Procedure Laterality Date   ABDOMINAL AORTOGRAM W/LOWER EXTREMITY N/A 02/15/2024   Procedure: ABDOMINAL AORTOGRAM W/LOWER EXTREMITY;  Surgeon: Pearline Norman RAMAN, MD;  Location: [facility] INVASIVE CV LAB;  Service: Cardiovascular;  Laterality: N/A;   AMPUTATION Left 01/02/2024   Procedure:  AMPUTATION, FOOT, RAY;  Surgeon: Malvin Marsa FALCON, DPM;  Location: [facility] OR;  Service: Orthopedics/Podiatry;  Laterality: Left;  FIFTH TOE   AMPUTATION TOE Left 02/29/2024   Procedure: AMPUTATION, LEFT GREAT TOE;  Surgeon: Malvin Marsa FALCON, DPM;  Location: [facility] OR;  Service: Orthopedics/Podiatry;  Laterality: Left;  Left Great toe partial amputation,  Debridement Left Heel   AMPUTATION TOE Left 03/29/2024   Procedure: AMPUTATION LEFT GREAT TOE;  Surgeon: Malvin Marsa FALCON, DPM;  Location: [facility] OR;  Service: Orthopedics/Podiatry;  Laterality: Left;  LEFT GREAT TOE   AXILLARY HIDRADENITIS EXCISION  1997   CARDIOVASCULAR STRESS TEST  07-24-2014  dr darron grass   Low risk lexiscan  nuclear study with apical thinning and small inferolateral wall infarct at mid & basal level , no ischemia/  normal LVF and wall motion , ef 59%   CATARACT EXTRACTION Left    CATARACT EXTRACTION W/ INTRAOCULAR LENS IMPLANT Right    CO2 LASER APPLICATION N/A 03/20/2014   Procedure: CO2 LASER APPLICATION,PENIS, GROIN, ANUS;  Surgeon: Elspeth KYM Schultze, MD;  Location: [facility];  Service: General;  Laterality: N/A;   CO2 LASER APPLICATION N/A 05/21/2015   Procedure: CO2 LASER APPLICATION;  Surgeon: Mark Ottelin, MD;  Location: [facility];  Service: Urology;  Laterality: N/A;   CONDYLOMA EXCISION/FULGURATION N/A 05/21/2015   Procedure: CONDYLOMA REMOVAL;  Surgeon: Mark Ottelin, MD;  Location: [facility];  Service: Urology;  Laterality: N/A;   HEMORRHOID SURGERY  10/24/2014   Procedure: HEMORRHOIDECTOMY;  Surgeon: Elspeth Schultze, MD;  Location: [facility];  Service: General;;   INCISION AND DRAINAGE ABSCESS Left 10/24/2014   Procedure: INCISION AND DRAINAGE ABSCESS;  Surgeon: Elspeth Schultze, MD;  Location: [facility];  Service: General;  Laterality: Left;   INCISION AND DRAINAGE OF WOUND Left 03/29/2024   Procedure: IRRIGATION AND  DEBRIDEMENT PLANTAR FOOT WOUND;  Surgeon: Malvin Marsa FALCON, DPM;  Location: [facility] OR;  Service: Orthopedics/Podiatry;  Laterality: Left;  LEFT GREAT TOE   INGUINAL HIDRADENITIS EXCISION  1998, 1999   LASER ABLATION CONDOLAMATA N/A 03/20/2014   Procedure: EXAM UNDER ANESTHESIA, REMOVAL/ABLATION OF CONDYLOMATA PENIS,GROINS, ANUS, ANAL CANAL;  Surgeon: Elspeth KYM Schultze, MD;  Location: [facility] [place];  Service: General;  Laterality: N/A;  groin and anus   LASER ABLATION CONDOLAMATA N/A 10/24/2014   Procedure: LASER ABLATION CONDOLAMATA;  Surgeon: Elspeth Schultze, MD;  Location: [facility] [place];  Service: General;  Laterality: N/A;   LASER ABLATION OF PENILE AND PERIANAL WARTS  07-29-2007  Dr. Schultze   LEFT SHOULDER SURGERY  2003   LOWER EXTREMITY ANGIOGRAPHY N/A 01/01/2024   Procedure: Lower Extremity Angiography;  Surgeon: Sheree Penne Bruckner, MD;  Location: [facility] INVASIVE CV LAB;  Service: Vascular;  Laterality: N/A;   LOWER EXTREMITY  ANGIOGRAPHY N/A 02/15/2024   Procedure: Lower Extremity Angiography;  Surgeon: Pearline Norman RAMAN, MD;  Location: [facility] INVASIVE CV LAB;  Service: Cardiovascular;  Laterality: N/A;   LOWER EXTREMITY INTERVENTION  01/01/2024   Procedure: LOWER EXTREMITY INTERVENTION;  Surgeon: Sheree Penne Bruckner, MD;  Location: [facility] INVASIVE CV LAB;  Service: Vascular;;   LOWER EXTREMITY INTERVENTION N/A 02/15/2024   Procedure: LOWER EXTREMITY INTERVENTION;  Surgeon: Pearline Norman RAMAN, MD;  Location: [facility] INVASIVE CV LAB;  Service: Cardiovascular;  Laterality: N/A;   MASS EXCISION N/A 10/24/2014   Procedure: EXCISION OF PERINEAL MASS/SINUS;  Surgeon: Elspeth Schultze, MD;  Location: [facility] [place];  Service: General;  Laterality: N/A;   MOHS SURGERY     back   MOHS SURGERY  2017   face   MULTIPLE TOOTH EXTRACTIONS     PERINEAL HIDRADENITIS EXCISION  1998, 1999   TRANSURETHRAL RESECTION OF BLADDER TUMOR  05/21/2012   Procedure: TRANSURETHRAL RESECTION  OF BLADDER TUMOR (TURBT);  Surgeon: Mark C Ottelin, MD;  Location: [facility];  Service: Urology;  Laterality: N/A;      Social History:  reports that he has been smoking cigars and cigarettes. He started smoking about 50 years ago. He has a 41 pack-year smoking history. He has never used smokeless tobacco. He reports that he does not drink alcohol and does not use drugs.  Allergies[1]  Family History  Problem Relation Age of Onset   Hypertension Mother    Lung cancer Father    Diabetes Maternal Aunt        x 2   Colon cancer Neg Hx    Esophageal cancer Neg Hx    Pancreatic cancer Neg Hx    Prostate cancer Neg Hx    Kidney disease Neg Hx    Liver disease Neg Hx    Rectal cancer Neg Hx    Stomach cancer Neg Hx     Prior to Admission medications  Medication Sig Start Date End Date Taking? Authorizing Provider  albuterol  (VENTOLIN  HFA) 108 (90 Base) MCG/ACT inhaler Inhale 2 puffs into the lungs every 4 (four) hours as needed for wheezing or shortness of breath. 06/11/24  Yes Ula Prentice SAUNDERS, MD  amiodarone (PACERONE) 200 MG tablet Take 200 mg by mouth daily. 07/29/24  Yes [provider]  atorvastatin  (LIPITOR ) 40 MG tablet TAKE 1 TABLET BY MOUTH ONCE DAILY 05/31/24  Yes Norleen Lynwood ORN, MD  citalopram  (CELEXA ) 20 MG tablet Take 1 tablet (20 mg total) by mouth daily. 05/31/24  Yes Norleen Lynwood ORN, MD  doxycycline  (VIBRA -TABS) 100 MG tablet Take 1 tablet (100 mg total) by mouth 2 (two) times daily. 08/09/24  Yes Norleen Lynwood ORN, MD  ELIQUIS 5 MG TABS tablet Take 5 mg by mouth 2 (two) times daily. 07/29/24  Yes [provider]  furosemide  (LASIX ) 40 MG tablet Take 40 mg by mouth daily. 07/29/24  Yes [provider]  insulin  glargine, 2 Unit Dial , (TOUJEO  MAX SOLOSTAR) 300 UNIT/ML Solostar Pen Inject 40 Units into the skin every morning. 05/31/24  Yes Norleen Lynwood ORN, MD  irbesartan  (AVAPRO ) 300 MG tablet Take 1 tablet (300 mg total) by mouth daily. 05/31/24   Yes Norleen Lynwood ORN, MD  pantoprazole  (PROTONIX ) 40 MG tablet Take 1 tablet (40 mg total) by mouth daily. 05/31/24  Yes Norleen Lynwood ORN, MD  Potassium Chloride  ER 20 MEQ TBCR Take 1 tablet by mouth daily. 07/29/24  Yes [provider]  spironolactone  (ALDACTONE ) 25 MG  tablet Take 25 mg by mouth daily. 07/29/24  Yes [provider]  Blood Glucose Monitoring Suppl (ACCU-CHEK GUIDE) w/Device KIT 1 Device by Does not apply route daily in the afternoon. 04/05/24   Shamleffer, Donell Cardinal, MD  Continuous Glucose Sensor (DEXCOM G7 SENSOR) MISC Change every 10 days 07/05/24   Shamleffer, Ibtehal Jaralla, MD  furosemide  (LASIX ) 20 MG tablet Take 1 tablet (20 mg total) by mouth daily. Patient not taking: Reported on 08/11/2024 07/13/24   Haze Lonni PARAS, MD  glucose blood (ACCU-CHEK GUIDE TEST) test strip 1 each by Other route 3 (three) times daily. Use as instructed 04/05/24   Shamleffer, Ibtehal Jaralla, MD  potassium chloride  SA (KLOR-CON  M) 20 MEQ tablet Take 2 tablets (40 mEq total) by mouth daily. Patient not taking: Reported on 08/11/2024 07/13/24   Haze Lonni PARAS, MD  predniSONE  (DELTASONE ) 50 MG tablet Take 1 tablet by mouth daily Patient not taking: Reported on 08/11/2024 06/11/24   Ula Prentice SAUNDERS, MD    Physical Exam: Vitals:   08/10/24 2055 08/10/24 2150 08/11/24 0037  BP:  (!) 143/69 (!) 149/93  Pulse:  72 76  Resp:  16 19  Temp:  99.2 F (37.3 C) 98.8 F (37.1 C)  TempSrc:  Oral   SpO2:  94% 95%  Weight: 68 kg    Height: 5' 11 (1.803 m)     Physical Exam Vitals and nursing note reviewed.  Constitutional:      General: He is not in acute distress. HENT:     Head: Normocephalic and atraumatic.  Cardiovascular:     Rate and Rhythm: Normal rate and regular rhythm.     Heart sounds: Normal heart sounds.  Pulmonary:     Effort: Pulmonary effort is normal.     Breath sounds: Normal breath sounds.  Abdominal:     Palpations: Abdomen is soft.      Tenderness: There is no abdominal tenderness.  Musculoskeletal:     Comments: See picture below  Neurological:     Mental Status: Mental status is at baseline.            Labs on Admission: I have personally reviewed following labs and imaging studies  CBC: Recent Labs  Lab 08/10/24 2119  WBC 19.5*  NEUTROABS 16.4*  HGB 15.1  HCT 42.8  MCV 92.2  PLT 308   Basic Metabolic Panel: Recent Labs  Lab 08/10/24 2119  NA 134*  K 3.5  CL 94*  CO2 29  GLUCOSE 141*  BUN 11  CREATININE 0.84  CALCIUM  9.0   GFR: Estimated Creatinine Clearance: 82.1 mL/min (by C-G formula based on SCr of 0.84 mg/dL). Liver Function Tests: Recent Labs  Lab 08/10/24 2119  AST 22  ALT 15  ALKPHOS 143*  BILITOT 0.9  PROT 7.2  ALBUMIN 3.1*   No results for input(s): LIPASE, AMYLASE in the last 168 hours. No results for input(s): AMMONIA in the last 168 hours. Coagulation Profile: Recent Labs  Lab 08/10/24 2119  INR 1.1   Cardiac Enzymes: No results for input(s): CKTOTAL, CKMB, CKMBINDEX, TROPONINI in the last 168 hours. BNP (last 3 results) No results for input(s): PROBNP in the last 8760 hours. HbA1C: No results for input(s): HGBA1C in the last 72 hours. CBG: No results for input(s): GLUCAP in the last 168 hours. Lipid Profile: No results for input(s): CHOL, HDL, LDLCALC, TRIG, CHOLHDL, LDLDIRECT in the last 72 hours. Thyroid  Function Tests: No results for input(s): TSH, T4TOTAL, FREET4, T3FREE, THYROIDAB in  the last 72 hours. Anemia Panel: No results for input(s): VITAMINB12, FOLATE, FERRITIN, TIBC, IRON, RETICCTPCT in the last 72 hours. Urine analysis:    Component Value Date/Time   COLORURINE YELLOW 03/27/2024 2358   APPEARANCEUR CLEAR 03/27/2024 2358   LABSPEC 1.010 03/27/2024 2358   PHURINE 5.0 03/27/2024 2358   GLUCOSEU >=500 (A) 03/27/2024 2358   GLUCOSEU >=1000 (A) 02/22/2024 1454   HGBUR NEGATIVE 03/27/2024  2358   BILIRUBINUR NEGATIVE 03/27/2024 2358   KETONESUR NEGATIVE 03/27/2024 2358   PROTEINUR 30 (A) 03/27/2024 2358   UROBILINOGEN 0.2 02/22/2024 1454   NITRITE NEGATIVE 03/27/2024 2358   LEUKOCYTESUR MODERATE (A) 03/27/2024 2358    Radiological Exams on Admission: US  Venous Img Lower Unilateral Left (DVT) Result Date: 08/10/2024 EXAM: ULTRASOUND DUPLEX OF THE LEFT LOWER EXTREMITY VEINS TECHNIQUE: Duplex ultrasound using B-mode/gray scaled imaging and Doppler spectral analysis and color flow was obtained of the deep venous structures of the left lower extremity. COMPARISON: None available. CLINICAL HISTORY: Left leg erythema. FINDINGS: The common femoral vein, femoral vein, popliteal vein, and posterior tibial vein demonstrate normal compressibility with normal color flow and spectral analysis. IMPRESSION: 1. No evidence of deep venous thrombosis. Electronically signed by: Greig Pique MD 08/10/2024 11:57 PM EST RP Workstation: [id number]   DG Foot 2 Views Left Result Date: 08/10/2024 EXAM: 2 VIEW(S) XRAY OF THE LEFT FOOT 08/10/2024 09:34:00 PM COMPARISON: 03/29/2024 CLINICAL HISTORY: fall, (no foot injury) erythema, eval for gas in mid foot (appears to have a hole in bottom of L foot (chronic appearing)) FINDINGS: BONES AND JOINTS: Status post amputation of the great toe and fifth toe at the level of the MTP joint. Mild cortical irregularity along the medial margin of the remaining first metatarsal head, suspicious for osteomyelitis. Small superior and plantar calcaneal spurs. SOFT TISSUES: Soft tissue swelling along the plantar surface of the forefoot at the amputation site with subcutaneous gas. IMPRESSION: 1. Soft tissue swelling along the plantar surface of the forefoot at the amputation site with subcutaneous gas. 2. Mild cortical irregularity along the medial margin of the remaining first metatarsal head, which could indicate osteomyelitis. 3. Status post amputation of the great toe and fifth  toe at the level of the MTP joint. Electronically signed by: Franky Stanford MD 08/10/2024 10:16 PM EST RP Workstation: [id number]   CT Head Wo Contrast Result Date: 08/10/2024 EXAM: CT HEAD WITHOUT CONTRAST 08/10/2024 09:28:38 PM TECHNIQUE: CT of the head was performed without the administration of intravenous contrast. Automated exposure control, iterative reconstruction, and/or weight based adjustment of the mA/kV was utilized to reduce the radiation dose to as low as reasonably achievable. COMPARISON: 07/13/2024 CLINICAL HISTORY: Facial trauma, blunt; fall, on anticoag. FINDINGS: BRAIN AND VENTRICLES: No acute hemorrhage. No evidence of acute infarct. No hydrocephalus. No extra-axial collection. No mass effect or midline shift. Stable age-related cerebral volume loss. Stable mild periventricular white matter disease. ORBITS: Bilateral lens replacement. SINUSES: Mucosal disease within ethmoid air cells and left maxillary sinus. SOFT TISSUES AND SKULL: No acute soft tissue abnormality. No skull fracture. VASCULATURE: Moderate to heavy calcific plaque within carotid siphons and vertebral arteries. IMPRESSION: 1. No acute intracranial abnormality. Electronically signed by: Donnice Mania MD 08/10/2024 09:38 PM EST RP Workstation: [id number]   Data Reviewed for HPI: Relevant notes from primary care and specialist visits, past discharge summaries as available in EHR, including Care Everywhere. Prior diagnostic testing as pertinent to current admission diagnoses Updated medications and problem lists for reconciliation ED course, including vitals, labs, imaging, treatment and  response to treatment Triage notes, nursing and pharmacy notes and ED provider's notes Notable results as noted above in HPI      Assessment and Plan: * Osteomyelitis of left foot (HCC) Cellulitis left foot PAD s/p prior amputation left 1st and 5th toes Continue Zosyn  and vancomycin  Follow-up MRI Podiatry consult for the a.m.   Dr. Lamount aware Will keep n.p.o. and hold Eliquis in case of procedure.   SCD for DVT prophylaxis  PAF (paroxysmal atrial fibrillation) (HCC) Continue Eliquis and amiodarone  CAD (coronary artery disease) Continue apixaban, atorvastatin   Diabetes (HCC) Sliding scale insulin  coverage  Essential hypertension Continue irbesartan , spironolactone  and Lasix   Housing instability Consider TOC consult  Tobacco abuse Nicotine patch if desired     DVT prophylaxis: SCD  Consults: TFA podiatry, Dr. Lamount  Advance Care Planning:   Code Status: Prior   Family Communication: none  Disposition Plan: Back to previous home environment  Severity of Illness: The appropriate patient status for this patient is INPATIENT. Inpatient status is judged to be reasonable and necessary in order to provide the required intensity of service to ensure the patient's safety. The patient's presenting symptoms, physical exam findings, and initial radiographic and laboratory data in the context of their chronic comorbidities is felt to place them at high risk for further clinical deterioration. Furthermore, it is not anticipated that the patient will be medically stable for discharge from the hospital within 2 midnights of admission.   * I certify that at the point of admission it is my clinical judgment that the patient will require inpatient hospital care spanning beyond 2 midnights from the point of admission due to high intensity of service, high risk for further deterioration and high frequency of surveillance required.*  Author: Delayne LULLA Solian, MD 08/11/2024 1:53 AM  For on call review [email or web address].      [1]  Allergies Allergen Reactions   Cefepime  Hives    Had allergic reaction to one of these 3 agents, unclear which one   Metoprolol  Hives    Had allergic reaction to one of these 3 agents, unclear which one  *Per RN, highly likely this is the cause of allergic rxn   Myrbetriq  [Mirabegron ]  Hives    Had allergic reaction to one of these 3 agents, unclear which one   "

## 2024-08-11 NOTE — Progress Notes (Signed)
 PHARMACY - ANTICOAGULATION CONSULT NOTE  Pharmacy Consult for UFH Indication: atrial fibrillation  Allergies[1]  Patient Measurements: Height: 5' 11 (180.3 cm) Weight: 68 kg (149 lb 14.6 oz) IBW/kg (Calculated) : 75.3 HEPARIN  DW (KG): 68  Vital Signs: Temp: 97.5 F (36.4 C) (01/01 0746) Temp Source: Oral (01/01 0746) BP: 149/77 (01/01 0746) Pulse Rate: 63 (01/01 0746)  Labs: Recent Labs    08/10/24 2119  HGB 15.1  HCT 42.8  PLT 308  LABPROT 14.8  INR 1.1  CREATININE 0.84    Estimated Creatinine Clearance: 82.1 mL/min (by C-G formula based on SCr of 0.84 mg/dL).   Medical History: Past Medical History:  Diagnosis Date   Adenomatous colon polyp    Allergic rhinitis    At risk for sleep apnea    STOP-BANG= 5   SENT TO PCP 03-14-2014   Bladder cancer (HCC)    CAD (coronary artery disease)    a. 07/2014 low risk MV; b. 07/2019 Cor CTA (FFR): LM 25-49 (nl), LAD mild prox/mid plaque (nl), D1 25-49p(nl w/ abnl FFR of 0.73 in inf branch), LCX/OM1 mild prox/mid plaque (nl), RCA nondominant, minimal Ca2+ plaque (nl), RPDA (mildly abnl @ 0.79)-->Med Rx..   Cataract    surgically removed bilateral   Condyloma acuminatum of penis    Diabetic neuropathy (HCC)    Diastolic dysfunction    a. 05/2019 Echo: EF 60-65%, no rwam, mod LVH, impaired relaxation, nl RV size/fxn, trace MR, triv TR.   Diverticulosis    GERD (gastroesophageal reflux disease)    History of bladder cancer    s/p  turbt  2013/   transitional cell carcinoma--    History of condyloma acuminatum    PERINEAL AREA  W/ RECURRENCY   History of gout    Hyperlipidemia    Hypertension    Hypoxemia 03/09/2024   Lower urinary tract symptoms (LUTS)    PAF (paroxysmal atrial fibrillation) (HCC)    a. 06/2019 Event monitor: PAF <1% burden. Longest 3 mins 36 secs.   Productive cough    PSVT (paroxysmal supraventricular tachycardia)    a. 06/2019 Event monitor: 112 episodes of SVT, longest 21 secs.   PVD  (peripheral vascular disease) with claudication    a. 03/2014 LE art duplex: long segment occlusion of mid to distal R SFA; b. 03/2020 ABI: nl left and mildly improved R ABI->med rx.   Renal artery stenosis    Renal artery stenosis    a. 12/2020 Renal art duplex: RRA 1-59%, LRA >60%.   Smokers' cough (HCC)    Type 2 diabetes mellitus with insulin  therapy (HCC) 1992   monitor by  dr ellsion   Wears dentures    upper    Medications:  Medications Prior to Admission  Medication Sig Dispense Refill Last Dose/Taking   albuterol  (VENTOLIN  HFA) 108 (90 Base) MCG/ACT inhaler Inhale 2 puffs into the lungs every 4 (four) hours as needed for wheezing or shortness of breath. 1 each 0 Unknown   amiodarone (PACERONE) 200 MG tablet Take 200 mg by mouth daily.   08/10/2024 at 10:00 AM   atorvastatin  (LIPITOR ) 40 MG tablet TAKE 1 TABLET BY MOUTH ONCE DAILY 90 tablet 3 08/10/2024 at 10:00 AM   citalopram  (CELEXA ) 20 MG tablet Take 1 tablet (20 mg total) by mouth daily. 90 tablet 2 08/10/2024 at 10:00 AM   doxycycline  (VIBRA -TABS) 100 MG tablet Take 1 tablet (100 mg total) by mouth 2 (two) times daily. 20 tablet 0 08/10/2024 at 10:00 AM  ELIQUIS 5 MG TABS tablet Take 5 mg by mouth 2 (two) times daily.   08/10/2024 at 10:00 AM   furosemide  (LASIX ) 40 MG tablet Take 40 mg by mouth daily.   08/10/2024 at 10:00 AM   insulin  glargine, 2 Unit Dial , (TOUJEO  MAX SOLOSTAR) 300 UNIT/ML Solostar Pen Inject 40 Units into the skin every morning. 15 mL 3 08/10/2024 at 10:00 AM   irbesartan  (AVAPRO ) 300 MG tablet Take 1 tablet (300 mg total) by mouth daily. 90 tablet 3 08/10/2024 at 10:00 AM   pantoprazole  (PROTONIX ) 40 MG tablet Take 1 tablet (40 mg total) by mouth daily. 100 tablet 2 08/10/2024 at 10:00 AM   Potassium Chloride  ER 20 MEQ TBCR Take 1 tablet by mouth daily.   08/10/2024 at 10:00 AM   spironolactone  (ALDACTONE ) 25 MG tablet Take 25 mg by mouth daily.   08/10/2024 at 10:00 AM   Blood Glucose Monitoring Suppl  (ACCU-CHEK GUIDE) w/Device KIT 1 Device by Does not apply route daily in the afternoon. 1 kit 0    Continuous Glucose Sensor (DEXCOM G7 SENSOR) MISC Change every 10 days 9 each 3    furosemide  (LASIX ) 20 MG tablet Take 1 tablet (20 mg total) by mouth daily. (Patient not taking: Reported on 08/11/2024) 5 tablet 0 Not Taking   glucose blood (ACCU-CHEK GUIDE TEST) test strip 1 each by Other route 3 (three) times daily. Use as instructed 300 each 12    potassium chloride  SA (KLOR-CON  M) 20 MEQ tablet Take 2 tablets (40 mEq total) by mouth daily. (Patient not taking: Reported on 08/11/2024) 14 tablet 0 Not Taking   predniSONE  (DELTASONE ) 50 MG tablet Take 1 tablet by mouth daily (Patient not taking: Reported on 08/11/2024) 4 tablet 0 Not Taking    Assessment: 69 y/o M admitted with LE cellulitis and possible osteomyelitis on apixaban PTA for AF with last dose yesterday. Pharmacy consulted to initiate heparin  bridge therapy.   Goal of Therapy:  Heparin  level 0.3-0.7 units/ml aPTT 66-102 seconds Monitor platelets by anticoagulation protocol: Yes   Plan:  Heparin  3000 units IV bolus x 1 Heparin  infusion at 1050 units/hr Baseline aPTT/HL aPTT 6 hours after infusion start Daily HL/CBC  Rozell Kettlewell D 08/11/2024,8:16 AM      [1]  Allergies Allergen Reactions   Cefepime  Hives    Had allergic reaction to one of these 3 agents, unclear which one   Metoprolol  Hives    Had allergic reaction to one of these 3 agents, unclear which one  *Per RN, highly likely this is the cause of allergic rxn   Myrbetriq  [Mirabegron ] Hives    Had allergic reaction to one of these 3 agents, unclear which one

## 2024-08-11 NOTE — Consult Note (Signed)
 "  PODIATRY CONSULTATION  NAME Paul SCHLAFER MRN [id number] DOB 1956-07-12 DOA 08/10/2024   Reason for consult:  Chief Complaint  Patient presents with   Fall    Attending/Consulting physician:  Dr. Charlie Patterson History of present illness: 69 y.o. male with medical history significant for PAD, osteomyelitis s/p toe amputation, DM, HTN, bladder cancer, paroxysmal atrial fibrillation on Eliquis, , depression being admitted for cellulitis left foot with possible osteomyelitis.  He was admitted for the same in August 2025, undergoing amputation of the left great toe at that time, and he was treated with meropenem -> Cipro  to complete 4 weeks after OR cultures grew Pseudomonas, Enterobacter and Proteus.  Today he presented by EMS with pain and swelling of the left foot with redness as well as dizziness and weakness, which caused him to fall hitting his head on 1 occasion but without loss of consciousness.  He reports not having electricity in the house and cannot power his cell phone.  He decided to come into the ED to get checked out. In the ED, temp of 99.2 with otherwise normal vitals.  Labs notable for WBC 19,000 but otherwise unremarkable. EKG with no acute concerning findings  Patient is known to our service.  He most recently underwent amputation of the left first toe at the metatarsophalangeal joint on 03/29/2024.  Had been followed for ulceration plantar first metatarsal head region that had been healing well but it has been sometime since he has been seen in our office.  Podiatry consulted for worsening appearance of the ulceration, concern for diabetic foot infection.  MRI pending at the time of my evaluation. Past Medical History:  Diagnosis Date   Adenomatous colon polyp    Allergic rhinitis    At risk for sleep apnea    STOP-BANG= 5   SENT TO PCP 03-14-2014   Bladder cancer (HCC)    CAD (coronary artery disease)    a. 07/2014 low risk MV; b. 07/2019 Cor CTA (FFR): LM 25-49  (nl), LAD mild prox/mid plaque (nl), D1 25-49p(nl w/ abnl FFR of 0.73 in inf branch), LCX/OM1 mild prox/mid plaque (nl), RCA nondominant, minimal Ca2+ plaque (nl), RPDA (mildly abnl @ 0.79)-->Med Rx..   Cataract    surgically removed bilateral   Condyloma acuminatum of penis    Diabetic neuropathy (HCC)    Diastolic dysfunction    a. 05/2019 Echo: EF 60-65%, no rwam, mod LVH, impaired relaxation, nl RV size/fxn, trace MR, triv TR.   Diverticulosis    GERD (gastroesophageal reflux disease)    History of bladder cancer    s/p  turbt  2013/   transitional cell carcinoma--    History of condyloma acuminatum    PERINEAL AREA  W/ RECURRENCY   History of gout    Hyperlipidemia    Hypertension    Hypoxemia 03/09/2024   Lower urinary tract symptoms (LUTS)    PAF (paroxysmal atrial fibrillation) (HCC)    a. 06/2019 Event monitor: PAF <1% burden. Longest 3 mins 36 secs.   Productive cough    PSVT (paroxysmal supraventricular tachycardia)    a. 06/2019 Event monitor: 112 episodes of SVT, longest 21 secs.   PVD (peripheral vascular disease) with claudication    a. 03/2014 LE art duplex: long segment occlusion of mid to distal R SFA; b. 03/2020 ABI: nl left and mildly improved R ABI->med rx.   Renal artery stenosis    Renal artery stenosis    a. 12/2020 Renal art duplex: RRA 1-59%, LRA >  60%.   Smokers' cough (HCC)    Type 2 diabetes mellitus with insulin  therapy (HCC) 1992   monitor by  dr ellsion   Wears dentures    upper       Latest Ref Rng & Units 08/10/2024    9:19 PM 07/13/2024    2:05 AM 06/10/2024    9:58 PM  CBC  WBC 4.0 - 10.5 K/uL 19.5  10.1  12.2   Hemoglobin 13.0 - 17.0 g/dL 84.8  85.1  85.3   Hematocrit 39.0 - 52.0 % 42.8  43.3  41.4   Platelets 150 - 400 K/uL 308  221  224        Latest Ref Rng & Units 08/10/2024    9:19 PM 07/13/2024    2:05 AM 06/10/2024    9:58 PM  BMP  Glucose 70 - 99 mg/dL 858  825  685   BUN 8 - 23 mg/dL 11  9  12    Creatinine 0.61 - 1.24 mg/dL  9.15  9.10  9.11   Sodium 135 - 145 mmol/L 134  141  134   Potassium 3.5 - 5.1 mmol/L 3.5  2.8  3.2   Chloride 98 - 111 mmol/L 94  98  96   CO2 22 - 32 mmol/L 29  31  27    Calcium  8.9 - 10.3 mg/dL 9.0  8.1  7.8       Physical Exam: Lower Extremity Exam Vasc: DP and PT pulses difficult to palpate.  Capillary refill 3 to 5 seconds to digits.  Skin temperature gradient warm to leg and midfoot, cold to toes and forefoot.  Some increased edema left foot.  Erythema present.  Concern for developing ischemic changes left plantar forefoot.  Appears cyanotic, slightly mottled laterally.  Derm: Left plantar first metatarsal head tracking laterally towards the second metatarsal head there is ulceration with subcutaneous tissue involvement approximately 3 cm in width.  No significant drainage.  No significant malodor.  Some mild skin changes left plantar forefoot.  Area of ulceration also noted distal lateral fifth MPJ region and stable eschar noted dorsal second PIPJ.  MSK:  Status post left first metatarsal amputation  Neuro: Light touch sensation diminished to digits.  Left foot xr: IMPRESSION: 1. Soft tissue swelling along the plantar surface of the forefoot at the amputation site with subcutaneous gas. 2. Mild cortical irregularity along the medial margin of the remaining first metatarsal head, which could indicate osteomyelitis. 3. 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   Electronically signed by: Franky Stanford MD 08/10/2024 10:16 PM EST RP Workstation: [id number]  Left foot MRI: IMPRESSION: 1. Soft tissue wound at the plantar medial forefoot overlying the level of the first metatarsal head with heterogenous fluid signal extending into the subcutaneous tissues of the amputation stump measuring approximately 1.5 x 1.3 x 1.0 cm, possibly representing organizing phlegmon, with foci of susceptibility artifact likely reflecting soft tissue air  communicating from the overlying open wound. 2. No convincing evidence of acute osteomyelitis at this time. 3. Bipartite medial hallux sesamoid with similar-appearing increased T2 signal compared to the prior exam. 4. Status post amputation of the great toe and fifth toe. 5. 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   ASSESSMENT/PLAN OF CARE 69 y.o. male with PMHx significant for  PAD, osteomyelitis s/p toe amputation, DM, HTN, bladder cancer, paroxysmal atrial fibrillation on Eliquis, depression with cellulitic left foot  with worsening appearance of wound.  - Clinical findings reviewed with patient -Bedside wound culture obtained today -MRI pending at the time of my examination.  At this point does seem to demonstrate developing phlegmon but does not show osteomyelitis - Continue IV abx broad spectrum pending further culture data -ABI and arterial ultrasound ordered, recommend vascular consult.  Please have history of left lower extremity bypass, has had continued tobacco use. - Anticoagulation: per primary team - Wound care: Betadine wet-to-dry dressings applied to left subfirst metatarsal head ulceration and lateral fifth MPJ lesion.  Change dressing daily - WB status: Partial weightbearing left foot in surgical shoe - Will continue to follow, appreciate vascular surgery input. Do not anticipate podiatric surgical intervention at this time unless there is inadequate response to antibiotics.    Thank you for the consult.  Please contact me directly with any questions or concerns.           Ethan Saddler, DPM [facility] / [facility]    [address] [place], [place] [address]                Office [phone]  Fax [phone]     "

## 2024-08-11 NOTE — ED Notes (Signed)
 Return pt's cane from the ED. Pt confirmed it was his personal cane

## 2024-08-11 NOTE — Progress Notes (Signed)
 " Progress Note   Patient: [name] FMW:[id number] DOB: 09-01-1955 DOA: 08/10/2024     1 DOS: the patient was seen and examined on 08/11/2024   Brief hospital course: 69 y.o. male with medical history significant for PAD, osteomyelitis s/p toe amputation, DM, HTN, bladder cancer, paroxysmal atrial fibrillation on Eliquis, , depression being admitted for cellulitis left foot with possible osteomyelitis.  He was admitted for the same in August 2025, undergoing amputation of the left great toe at that time, and he was treated with meropenem -> Cipro  to complete 4 weeks after OR cultures grew Pseudomonas, Enterobacter and Proteus.  Today he presented by EMS with pain and swelling of the left foot with redness as well as dizziness and weakness, which caused him to fall hitting his head on 1 occasion but without loss of consciousness.  He reports not having electricity in the house and cannot power his cell phone.  He decided to come into the ED to get checked out. In the ED, temp of 99.2 with otherwise normal vitals.  Labs notable for WBC 19,000 but otherwise unremarkable. EKG with no acute concerning findings Venous ultrasound negative for DVT Foot x-ray showed the following: IMPRESSION: 1. Soft tissue swelling along the plantar surface of the forefoot at the amputation site with subcutaneous gas. 2. Mild cortical irregularity along the medial margin of the remaining first metatarsal head, which could indicate osteomyelitis. 3. 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   The ED provider reached out to podiatrist, Dr.  Lamount who recommended Zosyn  and vancomycin  and get MRI.  No mention of OR  MRI of the foot did not show any convincing evidence of acute osteomyelitis at this time.  Assessment and Plan: * Cellulitis of left foot Also with numerous foot wounds.  Patient started on aggressive antibiotics with vancomycin  and Zosyn .  Appreciate podiatry and  vascular surgery consultations.  Likely will need an angiogram on Monday.  MRI does not show any signs of osteomyelitis.  Podiatry following.  Uncontrolled type 2 diabetes mellitus with hypoglycemia, with long-term current use of insulin  [facility]) Patient had a sugar of 38 this morning.  Patient given an amp of D50.  Will allow patient to eat today.  Monitor sugars check fingersticks every 4 hours.  Holding on insulin  at this point.  PAD (peripheral artery disease) Holding Eliquis just in case procedure needed will put on heparin  drip instead.  PAF (paroxysmal atrial fibrillation) (HCC) Continue heparin  drip and amiodarone  CAD (coronary artery disease) Continue heparin , atorvastatin   Essential hypertension Continue irbesartan , spironolactone  and Lasix   Housing instability Consider TOC consult  Tobacco abuse Nicotine patch        Subjective: Patient has been battling with his foot for months.  Came in after a fall.  Started on empiric antibiotics for cellulitis of the foot.  Physical Exam: Vitals:   08/10/24 2150 08/11/24 0037 08/11/24 0437 08/11/24 0746  BP: (!) 143/69 (!) 149/93 (!) 154/87 (!) 149/77  Pulse: 72 76 67 63  Resp: 16 19 19 16   Temp: 99.2 F (37.3 C) 98.8 F (37.1 C) 98.4 F (36.9 C) (!) 97.5 F (36.4 C)  TempSrc: Oral   Oral  SpO2: 94% 95% 95% 95%  Weight:      Height:       Physical Exam HENT:     Head: Normocephalic.  Eyes:     General: Lids are normal.  Cardiovascular:  Rate and Rhythm: Normal rate and regular rhythm.     Heart sounds: Normal heart sounds, S1 normal and S2 normal.  Pulmonary:     Breath sounds: No decreased breath sounds, wheezing, rhonchi or rales.  Abdominal:     Palpations: Abdomen is soft.     Tenderness: There is no abdominal tenderness.  Musculoskeletal:     Right lower leg: No swelling.     Left lower leg: No swelling.  Skin:    General: Skin is warm.     Comments: Left foot slight erythema.  Numerous wounds.   Neurological:     Mental Status: He is alert and oriented to person, place, and time.     Data Reviewed: Sodium 134, creatinine 0.84, alkaline phosphatase 143, other liver function test normal range, lactic acid negative, white blood cell count 19.5, hemoglobin 15.1, platelet count 308  Disposition: Status is: Inpatient Remains inpatient appropriate because: Patient will have an angiogram on Monday and potential podiatry debridement.  Continue IV antibiotics.  Planned Discharge Destination: Home    Time spent: 28 minutes  Author: Charlie Patterson, MD 08/11/2024 1:27 PM  For on call review [email or web address].  "

## 2024-08-11 NOTE — Assessment & Plan Note (Addendum)
 Continue heparin  drip and amiodarone

## 2024-08-11 NOTE — Assessment & Plan Note (Addendum)
 Nicotine  patch.

## 2024-08-11 NOTE — Hospital Course (Addendum)
 69 y.o. male with medical history significant for PAD, osteomyelitis s/p toe amputation, DM, HTN, bladder cancer, paroxysmal atrial fibrillation on Eliquis , , depression being admitted for cellulitis left foot with possible osteomyelitis.  He was admitted for the same in August 2025, undergoing amputation of the left great toe at that time, and he was treated with meropenem -> Cipro  to complete 4 weeks after OR cultures grew Pseudomonas, Enterobacter and Proteus.  Today he presented by EMS with pain and swelling of the left foot with redness as well as dizziness and weakness, which caused him to fall hitting his head on 1 occasion but without loss of consciousness.  He reports not having electricity in the house and cannot power his cell phone.  He decided to come into the ED to get checked out. In the ED, temp of 99.2 with otherwise normal vitals.  Labs notable for WBC 19,000 but otherwise unremarkable. EKG with no acute concerning findings Venous ultrasound negative for DVT Foot x-ray showed the following: IMPRESSION: 1. Soft tissue swelling along the plantar surface of the forefoot at the amputation site with subcutaneous gas. 2. Mild cortical irregularity along the medial margin of the remaining first metatarsal head, which could indicate osteomyelitis. 3. 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   The ED provider reached out to podiatrist, Dr.  Lamount who recommended Zosyn  and vancomycin  and get MRI.  No mention of OR  MRI of the foot did not show any convincing evidence of acute osteomyelitis at this time.  1/2.  Patient was contemplating leaving the hospital.  I told him we need to get lab draws if were on heparin  drip. 1/3.  Sugars finally up we will restart Lantus  insulin .  Patient states that he does not have transportation at home or power.  He is interested in returning home.

## 2024-08-11 NOTE — Assessment & Plan Note (Signed)
Consider TOC consult

## 2024-08-11 NOTE — Assessment & Plan Note (Deleted)
 Sliding scale insulin  coverage

## 2024-08-11 NOTE — Assessment & Plan Note (Addendum)
 Patient had a sugar of 38 on 08/11/2024.  Sugars starting to elevate today we will restart Lantus  20 units daily continue sliding scale.

## 2024-08-11 NOTE — Progress Notes (Signed)
 Patient taken off floor on bed, to get u/s done

## 2024-08-11 NOTE — Progress Notes (Signed)
 PHARMACY - ANTICOAGULATION CONSULT NOTE  Pharmacy Consult for UFH Indication: atrial fibrillation  Allergies[1]  Patient Measurements: Height: 5' 11 (180.3 cm) Weight: 68 kg (149 lb 14.6 oz) IBW/kg (Calculated) : 75.3 HEPARIN  DW (KG): 68  Vital Signs: Temp: 97.5 F (36.4 C) (01/01 0746) Temp Source: Oral (01/01 0746) BP: 149/77 (01/01 0746) Pulse Rate: 63 (01/01 0746)  Labs: Recent Labs    08/10/24 2119 08/11/24 0845 08/11/24 1518  HGB 15.1  --   --   HCT 42.8  --   --   PLT 308  --   --   APTT  --  40* 43*  LABPROT 14.8  --   --   INR 1.1  --   --   HEPARINUNFRC  --  0.19*  --   CREATININE 0.84  --   --     Estimated Creatinine Clearance: 82.1 mL/min (by C-G formula based on SCr of 0.84 mg/dL).   Medical History: Past Medical History:  Diagnosis Date   Adenomatous colon polyp    Allergic rhinitis    At risk for sleep apnea    STOP-BANG= 5   SENT TO PCP 03-14-2014   Bladder cancer (HCC)    CAD (coronary artery disease)    a. 07/2014 low risk MV; b. 07/2019 Cor CTA (FFR): LM 25-49 (nl), LAD mild prox/mid plaque (nl), D1 25-49p(nl w/ abnl FFR of 0.73 in inf branch), LCX/OM1 mild prox/mid plaque (nl), RCA nondominant, minimal Ca2+ plaque (nl), RPDA (mildly abnl @ 0.79)-->Med Rx..   Cataract    surgically removed bilateral   Condyloma acuminatum of penis    Diabetic neuropathy (HCC)    Diastolic dysfunction    a. 05/2019 Echo: EF 60-65%, no rwam, mod LVH, impaired relaxation, nl RV size/fxn, trace MR, triv TR.   Diverticulosis    GERD (gastroesophageal reflux disease)    History of bladder cancer    s/p  turbt  2013/   transitional cell carcinoma--    History of condyloma acuminatum    PERINEAL AREA  W/ RECURRENCY   History of gout    Hyperlipidemia    Hypertension    Hypoxemia 03/09/2024   Lower urinary tract symptoms (LUTS)    PAF (paroxysmal atrial fibrillation) (HCC)    a. 06/2019 Event monitor: PAF <1% burden. Longest 3 mins 36 secs.   Productive  cough    PSVT (paroxysmal supraventricular tachycardia)    a. 06/2019 Event monitor: 112 episodes of SVT, longest 21 secs.   PVD (peripheral vascular disease) with claudication    a. 03/2014 LE art duplex: long segment occlusion of mid to distal R SFA; b. 03/2020 ABI: nl left and mildly improved R ABI->med rx.   Renal artery stenosis    Renal artery stenosis    a. 12/2020 Renal art duplex: RRA 1-59%, LRA >60%.   Smokers' cough (HCC)    Type 2 diabetes mellitus with insulin  therapy (HCC) 1992   monitor by  dr ellsion   Wears dentures    upper    Medications:  Medications Prior to Admission  Medication Sig Dispense Refill Last Dose/Taking   albuterol  (VENTOLIN  HFA) 108 (90 Base) MCG/ACT inhaler Inhale 2 puffs into the lungs every 4 (four) hours as needed for wheezing or shortness of breath. 1 each 0 Unknown   amiodarone (PACERONE) 200 MG tablet Take 200 mg by mouth daily.   08/10/2024 at 10:00 AM   atorvastatin  (LIPITOR ) 40 MG tablet TAKE 1 TABLET BY MOUTH ONCE DAILY 90  tablet 3 08/10/2024 at 10:00 AM   citalopram  (CELEXA ) 20 MG tablet Take 1 tablet (20 mg total) by mouth daily. 90 tablet 2 08/10/2024 at 10:00 AM   doxycycline  (VIBRA -TABS) 100 MG tablet Take 1 tablet (100 mg total) by mouth 2 (two) times daily. 20 tablet 0 08/10/2024 at 10:00 AM   ELIQUIS 5 MG TABS tablet Take 5 mg by mouth 2 (two) times daily.   08/10/2024 at 10:00 AM   furosemide  (LASIX ) 40 MG tablet Take 40 mg by mouth daily.   08/10/2024 at 10:00 AM   insulin  glargine, 2 Unit Dial , (TOUJEO  MAX SOLOSTAR) 300 UNIT/ML Solostar Pen Inject 40 Units into the skin every morning. 15 mL 3 08/10/2024 at 10:00 AM   irbesartan  (AVAPRO ) 300 MG tablet Take 1 tablet (300 mg total) by mouth daily. 90 tablet 3 08/10/2024 at 10:00 AM   pantoprazole  (PROTONIX ) 40 MG tablet Take 1 tablet (40 mg total) by mouth daily. 100 tablet 2 08/10/2024 at 10:00 AM   Potassium Chloride  ER 20 MEQ TBCR Take 1 tablet by mouth daily.   08/10/2024 at 10:00 AM    spironolactone  (ALDACTONE ) 25 MG tablet Take 25 mg by mouth daily.   08/10/2024 at 10:00 AM   Blood Glucose Monitoring Suppl (ACCU-CHEK GUIDE) w/Device KIT 1 Device by Does not apply route daily in the afternoon. 1 kit 0    Continuous Glucose Sensor (DEXCOM G7 SENSOR) MISC Change every 10 days 9 each 3    furosemide  (LASIX ) 20 MG tablet Take 1 tablet (20 mg total) by mouth daily. (Patient not taking: Reported on 08/11/2024) 5 tablet 0 Not Taking   glucose blood (ACCU-CHEK GUIDE TEST) test strip 1 each by Other route 3 (three) times daily. Use as instructed 300 each 12    potassium chloride  SA (KLOR-CON  M) 20 MEQ tablet Take 2 tablets (40 mEq total) by mouth daily. (Patient not taking: Reported on 08/11/2024) 14 tablet 0 Not Taking   predniSONE  (DELTASONE ) 50 MG tablet Take 1 tablet by mouth daily (Patient not taking: Reported on 08/11/2024) 4 tablet 0 Not Taking    Assessment: 69 y/o M admitted with LE cellulitis and possible osteomyelitis on apixaban PTA for AF with last dose yesterday. Pharmacy consulted to initiate heparin  bridge therapy.  Update 1/1 after baseline labs -- baseline aPTT and HL both correlating. Will titrate by heparin  level.  Goal of Therapy:  Heparin  level 0.3-0.7 units/ml aPTT 66-102 seconds Monitor platelets by anticoagulation protocol: Yes  1/1 @ 1518: aPTT 43, HL 0.12 = SUBtherapeutic at 1050 un/hr   Plan:  Heparin  2000 units IV bolus x 1 Increase heparin  infusion rate to 1250 units/hr HL 6 hours after rate change Daily CBC  Will M. Lenon, PharmD, BCPS Clinical Pharmacist 08/11/2024 3:55 PM     [1]  Allergies Allergen Reactions   Cefepime  Hives    Had allergic reaction to one of these 3 agents, unclear which one   Metoprolol  Hives    Had allergic reaction to one of these 3 agents, unclear which one  *Per RN, highly likely this is the cause of allergic rxn   Myrbetriq  [Mirabegron ] Hives    Had allergic reaction to one of these 3 agents, unclear which  one

## 2024-08-11 NOTE — Assessment & Plan Note (Signed)
 Continue irbesartan , spironolactone  and Lasix 

## 2024-08-11 NOTE — Progress Notes (Signed)
 Pharmacy Antibiotic Note  [name] is a 69 y.o. male admitted on 08/10/2024 with wound infection.  Pharmacy has been consulted for Vancomycin , Zosyn   dosing.  Plan: Zosyn  3.375 gm IV X 1 given in ED over 30 min on 12/31 @ 2233. Zosyn  3.375 gm IV Q8H EI ordered to start on 01/01 @ 0430.  Vancomycin  1500 mg IV X 1 loading dose given in ED on 01/01 @ 0023. Vancomycin  1 gm IV Q12H ordered to start on 1/02 @ 1200.  AUC = 511.7 Vanc trough = 13.8   Height: 5' 11 (180.3 cm) Weight: 68 kg (149 lb 14.6 oz) IBW/kg (Calculated) : 75.3  Temp (24hrs), Avg:99 F (37.2 C), Min:98.8 F (37.1 C), Max:99.2 F (37.3 C)  Recent Labs  Lab 08/10/24 2119  WBC 19.5*  CREATININE 0.84  LATICACIDVEN 1.4    Estimated Creatinine Clearance: 82.1 mL/min (by C-G formula based on SCr of 0.84 mg/dL).    Allergies[1]  Antimicrobials this admission:   >>    >>   Dose adjustments this admission:   Microbiology results:  BCx:   UCx:    Sputum:    MRSA PCR:   Thank you for allowing pharmacy to be a part of this patients care.  [name] D 08/11/2024 2:10 AM     [1]  Allergies Allergen Reactions   Cefepime  Hives    Had allergic reaction to one of these 3 agents, unclear which one   Metoprolol  Hives    Had allergic reaction to one of these 3 agents, unclear which one  *Per RN, highly likely this is the cause of allergic rxn   Myrbetriq  [Mirabegron ] Hives    Had allergic reaction to one of these 3 agents, unclear which one

## 2024-08-11 NOTE — Progress Notes (Addendum)
 Pharmacy Antibiotic Note  [name] is a 69 y.o. male admitted on 08/10/2024 with wound infection.  Pharmacy has been consulted for Vancomycin , Zosyn   dosing.  Plan: Continue Zosyn  3.375 g EI q 8 hours Change vancomycin  dosing to 750 mg iv q 12 hours F/U renal function and culture results  AUC = 422 Vanc trough = 11.9  Height: 5' 11 (180.3 cm) Weight: 68 kg (149 lb 14.6 oz) IBW/kg (Calculated) : 75.3  Temp (24hrs), Avg:98.5 F (36.9 C), Min:97.5 F (36.4 C), Max:99.2 F (37.3 C)  Recent Labs  Lab 08/10/24 2119 08/10/24 2303  WBC 19.5*  --   CREATININE 0.84  --   LATICACIDVEN 1.4 1.1    Estimated Creatinine Clearance: 82.1 mL/min (by C-G formula based on SCr of 0.84 mg/dL).    Allergies[1]  Antimicrobials this admission: Zosyn  D1 Vanc D1  Dose adjustments this admission: Vanc 1000 mg q 12 h > 750 mg q 12h  Microbiology results: 12/31 BCx: NGTD 12/31 WCx: sent  Thank you for allowing pharmacy to be a part of this patients care.  Bari Glendia BIRCH 08/11/2024 8:34 AM      [1]  Allergies Allergen Reactions   Cefepime  Hives    Had allergic reaction to one of these 3 agents, unclear which one   Metoprolol  Hives    Had allergic reaction to one of these 3 agents, unclear which one  *Per RN, highly likely this is the cause of allergic rxn   Myrbetriq  [Mirabegron ] Hives    Had allergic reaction to one of these 3 agents, unclear which one

## 2024-08-11 NOTE — Progress Notes (Addendum)
 Called by tech with CBG of 30. Pt given amp of D50 IV d/t NPO status for podiatry this AM. Pt asking for food. Pt is confused and clammy at this time. Dr Josette on the floor and updated verbally. Dr Lamount with podiatry at bedside to assess patient's foot.  Patient's left foot, paint daily with betadine, wet to dry gauze, kerlex and elastic bandage.   Dr Josette at bedside to assess patient.

## 2024-08-11 NOTE — Plan of Care (Signed)

## 2024-08-11 NOTE — Assessment & Plan Note (Addendum)
 Continue heparin , atorvastatin 

## 2024-08-11 NOTE — Progress Notes (Signed)
" °   08/11/24 0730  Spiritual Encounters  Type of Visit Initial  Care provided to: Patient  Conversation partners present during encounter Nurse  Reason for visit Advance directives  OnCall Visit Yes   Chaplain visited patient due to a [place] Consult in the system.  Patient was tired/sleepy and so Chaplain left paperwork and will share with colleague to see if he may be able to return to follow-up.    Rev. Rana M. Nicholaus, M.Div. Chaplain Resident [facility] "

## 2024-08-11 NOTE — Assessment & Plan Note (Addendum)
 Also with numerous foot wounds.  Patient started on aggressive antibiotics with vancomycin  and Zosyn .  Appreciate podiatry and vascular surgery consultations.  Angiogram on Monday.  MRI does not show any signs of osteomyelitis.  Podiatry following.  Wound culture growing moderate Staph aureus, Morganella and Enterococcus.  Case discussed with pharmacist and Zosyn  would cover all 3 organisms.  Morganella does show resistance and we do not have an oral choice.  Discontinue vancomycin .

## 2024-08-11 NOTE — Assessment & Plan Note (Addendum)
 On heparin  drip.  Angiogram done on 1/5 and patient will need a femoral endarterectomy which is scheduled for 1/7.  Cardiac catheterization today for cardiac clearance.

## 2024-08-11 NOTE — Hospital Course (Addendum)
 SABRA

## 2024-08-12 ENCOUNTER — Inpatient Hospital Stay

## 2024-08-12 DIAGNOSIS — Z59819 Housing instability, housed unspecified: Secondary | ICD-10-CM

## 2024-08-12 DIAGNOSIS — I2510 Atherosclerotic heart disease of native coronary artery without angina pectoris: Secondary | ICD-10-CM | POA: Diagnosis not present

## 2024-08-12 DIAGNOSIS — F17200 Nicotine dependence, unspecified, uncomplicated: Secondary | ICD-10-CM | POA: Diagnosis not present

## 2024-08-12 DIAGNOSIS — Z794 Long term (current) use of insulin: Secondary | ICD-10-CM | POA: Diagnosis not present

## 2024-08-12 DIAGNOSIS — I739 Peripheral vascular disease, unspecified: Secondary | ICD-10-CM | POA: Diagnosis not present

## 2024-08-12 DIAGNOSIS — I480 Paroxysmal atrial fibrillation: Secondary | ICD-10-CM | POA: Diagnosis not present

## 2024-08-12 DIAGNOSIS — I10 Essential (primary) hypertension: Secondary | ICD-10-CM | POA: Diagnosis not present

## 2024-08-12 DIAGNOSIS — L03116 Cellulitis of left lower limb: Secondary | ICD-10-CM | POA: Diagnosis not present

## 2024-08-12 DIAGNOSIS — E11649 Type 2 diabetes mellitus with hypoglycemia without coma: Secondary | ICD-10-CM | POA: Diagnosis not present

## 2024-08-12 DIAGNOSIS — C679 Malignant neoplasm of bladder, unspecified: Secondary | ICD-10-CM | POA: Diagnosis not present

## 2024-08-12 LAB — CBC
HCT: 36.3 % — ABNORMAL LOW (ref 39.0–52.0)
Hemoglobin: 12.6 g/dL — ABNORMAL LOW (ref 13.0–17.0)
MCH: 31.9 pg (ref 26.0–34.0)
MCHC: 34.7 g/dL (ref 30.0–36.0)
MCV: 91.9 fL (ref 80.0–100.0)
Platelets: 265 K/uL (ref 150–400)
RBC: 3.95 MIL/uL — ABNORMAL LOW (ref 4.22–5.81)
RDW: 14 % (ref 11.5–15.5)
WBC: 12 K/uL — ABNORMAL HIGH (ref 4.0–10.5)
nRBC: 0 % (ref 0.0–0.2)

## 2024-08-12 LAB — HEPARIN LEVEL (UNFRACTIONATED)
Heparin Unfractionated: <0.1 [IU]/mL — ABNORMAL LOW (ref 0.30–0.70)
Heparin Unfractionated: <0.1 [IU]/mL — ABNORMAL LOW (ref 0.30–0.70)
Heparin Unfractionated: <0.1 [IU]/mL — ABNORMAL LOW (ref 0.30–0.70)

## 2024-08-12 LAB — GLUCOSE, CAPILLARY
Glucose-Capillary: 111 mg/dL — ABNORMAL HIGH (ref 70–99)
Glucose-Capillary: 152 mg/dL — ABNORMAL HIGH (ref 70–99)
Glucose-Capillary: 206 mg/dL — ABNORMAL HIGH (ref 70–99)
Glucose-Capillary: 327 mg/dL — ABNORMAL HIGH (ref 70–99)
Glucose-Capillary: 71 mg/dL (ref 70–99)

## 2024-08-12 MED ORDER — INSULIN ASPART 100 UNIT/ML IJ SOLN
0.0000 [IU] | Freq: Three times a day (TID) | INTRAMUSCULAR | Status: DC
  Administered 2024-08-12: 4 [IU] via SUBCUTANEOUS
  Administered 2024-08-13: 3 [IU] via SUBCUTANEOUS
  Administered 2024-08-13 – 2024-08-14 (×2): 2 [IU] via SUBCUTANEOUS
  Administered 2024-08-14: 3 [IU] via SUBCUTANEOUS
  Administered 2024-08-14: 1 [IU] via SUBCUTANEOUS
  Administered 2024-08-16: 3 [IU] via SUBCUTANEOUS
  Administered 2024-08-16 – 2024-08-18 (×4): 1 [IU] via SUBCUTANEOUS
  Administered 2024-08-18: 2 [IU] via SUBCUTANEOUS
  Administered 2024-08-18: 4 [IU] via SUBCUTANEOUS
  Administered 2024-08-19: 6 [IU] via SUBCUTANEOUS
  Administered 2024-08-19 (×2): 3 [IU] via SUBCUTANEOUS
  Administered 2024-08-20: 4 [IU] via SUBCUTANEOUS
  Administered 2024-08-20: 3 [IU] via SUBCUTANEOUS
  Administered 2024-08-21 (×3): 4 [IU] via SUBCUTANEOUS
  Administered 2024-08-22: 3 [IU] via SUBCUTANEOUS
  Filled 2024-08-12 (×2): qty 3
  Filled 2024-08-12: qty 1
  Filled 2024-08-12: qty 3
  Filled 2024-08-12: qty 4
  Filled 2024-08-12: qty 3
  Filled 2024-08-12: qty 1
  Filled 2024-08-12 (×2): qty 4
  Filled 2024-08-12: qty 1
  Filled 2024-08-12: qty 3
  Filled 2024-08-12: qty 2
  Filled 2024-08-12: qty 6
  Filled 2024-08-12: qty 3
  Filled 2024-08-12 (×2): qty 2
  Filled 2024-08-12: qty 1
  Filled 2024-08-12: qty 4
  Filled 2024-08-12: qty 2
  Filled 2024-08-12: qty 1

## 2024-08-12 MED ORDER — HEPARIN BOLUS VIA INFUSION
2050.0000 [IU] | Freq: Once | INTRAVENOUS | Status: AC
  Administered 2024-08-12: 2050 [IU] via INTRAVENOUS
  Filled 2024-08-12: qty 2050

## 2024-08-12 MED ORDER — INSULIN ASPART 100 UNIT/ML IJ SOLN
0.0000 [IU] | Freq: Every day | INTRAMUSCULAR | Status: DC
  Administered 2024-08-13 – 2024-08-15 (×2): 2 [IU] via SUBCUTANEOUS
  Administered 2024-08-16 – 2024-08-17 (×2): 3 [IU] via SUBCUTANEOUS
  Administered 2024-08-19 – 2024-08-20 (×2): 2 [IU] via SUBCUTANEOUS
  Administered 2024-08-21: 4 [IU] via SUBCUTANEOUS
  Filled 2024-08-12: qty 4
  Filled 2024-08-12 (×3): qty 2
  Filled 2024-08-12: qty 3
  Filled 2024-08-12: qty 2
  Filled 2024-08-12: qty 3

## 2024-08-12 NOTE — Progress Notes (Signed)
 Late entry- The patient declined to have the am labs drawn when approached by lab. This RN attempted to educate the patient about the lab orders, along with ordered medications. The patient continued to decline to have labs obtained at this time. Will defer to day shift.

## 2024-08-12 NOTE — Progress Notes (Signed)
 PHARMACY - ANTICOAGULATION CONSULT NOTE  Pharmacy Consult for UFH Indication: atrial fibrillation  Allergies[1]  Patient Measurements: Height: 5' 11 (180.3 cm) Weight: 68 kg (149 lb 14.6 oz) IBW/kg (Calculated) : 75.3 HEPARIN  DW (KG): 68  Vital Signs: Temp: 97.7 F (36.5 C) (01/02 0847) Temp Source: Oral (01/02 0407) BP: 139/77 (01/02 0847) Pulse Rate: 70 (01/02 0847)  Labs: Recent Labs    08/10/24 2119 08/11/24 0845 08/11/24 0845 08/11/24 1518 08/12/24 0022 08/12/24 0843  HGB 15.1  --   --   --   --  12.6*  HCT 42.8  --   --   --   --  36.3*  PLT 308  --   --   --   --  265  APTT  --  40*  --  43*  --   --   LABPROT 14.8  --   --   --   --   --   INR 1.1  --   --   --   --   --   HEPARINUNFRC  --  0.19*   < > 0.12* <0.10* <0.10*  CREATININE 0.84  --   --   --   --   --    < > = values in this interval not displayed.    Estimated Creatinine Clearance: 82.1 mL/min (by C-G formula based on SCr of 0.84 mg/dL).   Medical History: Past Medical History:  Diagnosis Date   Adenomatous colon polyp    Allergic rhinitis    At risk for sleep apnea    STOP-BANG= 5   SENT TO PCP 03-14-2014   Bladder cancer (HCC)    CAD (coronary artery disease)    a. 07/2014 low risk MV; b. 07/2019 Cor CTA (FFR): LM 25-49 (nl), LAD mild prox/mid plaque (nl), D1 25-49p(nl w/ abnl FFR of 0.73 in inf branch), LCX/OM1 mild prox/mid plaque (nl), RCA nondominant, minimal Ca2+ plaque (nl), RPDA (mildly abnl @ 0.79)-->Med Rx..   Cataract    surgically removed bilateral   Condyloma acuminatum of penis    Diabetic neuropathy (HCC)    Diastolic dysfunction    a. 05/2019 Echo: EF 60-65%, no rwam, mod LVH, impaired relaxation, nl RV size/fxn, trace MR, triv TR.   Diverticulosis    GERD (gastroesophageal reflux disease)    History of bladder cancer    s/p  turbt  2013/   transitional cell carcinoma--    History of condyloma acuminatum    PERINEAL AREA  W/ RECURRENCY   History of gout     Hyperlipidemia    Hypertension    Hypoxemia 03/09/2024   Lower urinary tract symptoms (LUTS)    PAF (paroxysmal atrial fibrillation) (HCC)    a. 06/2019 Event monitor: PAF <1% burden. Longest 3 mins 36 secs.   Productive cough    PSVT (paroxysmal supraventricular tachycardia)    a. 06/2019 Event monitor: 112 episodes of SVT, longest 21 secs.   PVD (peripheral vascular disease) with claudication    a. 03/2014 LE art duplex: long segment occlusion of mid to distal R SFA; b. 03/2020 ABI: nl left and mildly improved R ABI->med rx.   Renal artery stenosis    Renal artery stenosis    a. 12/2020 Renal art duplex: RRA 1-59%, LRA >60%.   Smokers' cough (HCC)    Type 2 diabetes mellitus with insulin  therapy (HCC) 1992   monitor by  dr ellsion   Wears dentures    upper    Medications:  Medications Prior to Admission  Medication Sig Dispense Refill Last Dose/Taking   albuterol  (VENTOLIN  HFA) 108 (90 Base) MCG/ACT inhaler Inhale 2 puffs into the lungs every 4 (four) hours as needed for wheezing or shortness of breath. 1 each 0 Unknown   amiodarone (PACERONE) 200 MG tablet Take 200 mg by mouth daily.   08/10/2024 at 10:00 AM   atorvastatin  (LIPITOR ) 40 MG tablet TAKE 1 TABLET BY MOUTH ONCE DAILY 90 tablet 3 08/10/2024 at 10:00 AM   citalopram  (CELEXA ) 20 MG tablet Take 1 tablet (20 mg total) by mouth daily. 90 tablet 2 08/10/2024 at 10:00 AM   doxycycline  (VIBRA -TABS) 100 MG tablet Take 1 tablet (100 mg total) by mouth 2 (two) times daily. 20 tablet 0 08/10/2024 at 10:00 AM   ELIQUIS 5 MG TABS tablet Take 5 mg by mouth 2 (two) times daily.   08/10/2024 at 10:00 AM   furosemide  (LASIX ) 40 MG tablet Take 40 mg by mouth daily.   08/10/2024 at 10:00 AM   insulin  glargine, 2 Unit Dial , (TOUJEO  MAX SOLOSTAR) 300 UNIT/ML Solostar Pen Inject 40 Units into the skin every morning. 15 mL 3 08/10/2024 at 10:00 AM   irbesartan  (AVAPRO ) 300 MG tablet Take 1 tablet (300 mg total) by mouth daily. 90 tablet 3  08/10/2024 at 10:00 AM   pantoprazole  (PROTONIX ) 40 MG tablet Take 1 tablet (40 mg total) by mouth daily. 100 tablet 2 08/10/2024 at 10:00 AM   Potassium Chloride  ER 20 MEQ TBCR Take 1 tablet by mouth daily.   08/10/2024 at 10:00 AM   spironolactone  (ALDACTONE ) 25 MG tablet Take 25 mg by mouth daily.   08/10/2024 at 10:00 AM   Blood Glucose Monitoring Suppl (ACCU-CHEK GUIDE) w/Device KIT 1 Device by Does not apply route daily in the afternoon. 1 kit 0    Continuous Glucose Sensor (DEXCOM G7 SENSOR) MISC Change every 10 days 9 each 3    furosemide  (LASIX ) 20 MG tablet Take 1 tablet (20 mg total) by mouth daily. (Patient not taking: Reported on 08/11/2024) 5 tablet 0 Not Taking   glucose blood (ACCU-CHEK GUIDE TEST) test strip 1 each by Other route 3 (three) times daily. Use as instructed 300 each 12    potassium chloride  SA (KLOR-CON  M) 20 MEQ tablet Take 2 tablets (40 mEq total) by mouth daily. (Patient not taking: Reported on 08/11/2024) 14 tablet 0 Not Taking   predniSONE  (DELTASONE ) 50 MG tablet Take 1 tablet by mouth daily (Patient not taking: Reported on 08/11/2024) 4 tablet 0 Not Taking    Assessment: 69 y/o M admitted with LE cellulitis and possible osteomyelitis on apixaban PTA for AF with last dose yesterday. Pharmacy consulted to initiate heparin  bridge therapy.  Update 1/1 after baseline labs -- baseline aPTT and HL both correlating. Will titrate by heparin  level.  Goal of Therapy:  Heparin  level 0.3-0.7 units/ml aPTT 66-102 seconds Monitor platelets by anticoagulation protocol: Yes  1/1 @ 1518: aPTT 43, HL 0.12 = SUBtherapeutic at 1050 un/hr 1/2 @ 0022: HL = < 0.1, SUBtherapeutic 1/2 @ 0843: HL = < 0.1, SUBtherapeutic   Plan:  1/2 @ 0843: HL = < 0.1, SUBtherapeutic - HL remains low despite previous rate increases.  RN reports heparin  gtt has been running without interruption - Will order heparin  2050 units IV X 1 bolus and increase drip rate to 1600 units/hr - Daily CBC  Lum VEAR Mania, PharmD, BCPS Clinical Pharmacist 08/12/2024 11:15 AM       [1]  Allergies Allergen Reactions   Cefepime  Hives    Had allergic reaction to one of these 3 agents, unclear which one   Metoprolol  Hives    Had allergic reaction to one of these 3 agents, unclear which one  *Per RN, highly likely this is the cause of allergic rxn   Myrbetriq  [Mirabegron ] Hives    Had allergic reaction to one of these 3 agents, unclear which one

## 2024-08-12 NOTE — TOC CM/SW Note (Signed)
 Transition of Care [facility]) - Inpatient Brief Assessment   Patient Details  Name: [name] MRN: [id number] Date of Birth: [birth year]  [facility] of [facility] CM/SW Contact:    Daved JONETTA Hamilton, RN Phone Number: 08/12/2024, 10:17 AM   Clinical Narrative:  Resources added to AVS for food, housing, transportation, utilities, and social isolation. Per TOC chart review, patient is on IV antibiotics and is scheduled for angiogram and possible debridement by Podiatry on Monday 08/15/24. TOC will continue to follow for discharge planning/assistance.   Transition of Care Asessment: Insurance and Status: Insurance coverage has been reviewed Patient has primary care physician: Yes   Prior level of function:: Independent Prior/Current Home Services: No current home services Social Drivers of Health Review: SDOH reviewed interventions complete Readmission risk has been reviewed: Yes Transition of care needs: no transition of care needs at this time

## 2024-08-12 NOTE — Progress Notes (Signed)
 The pt.'s left  piv site was noted to be bleeding after a repeat dressing change. Catheter removed with continued oozing noted. Pressure held with minimal bleeding noted. Pressure dressing applied to the area. The dressing remains clean,dry, and intact. The pt. was educated about the need for a second site due to the heparin  infusing along with the antibiotic schedule. At the time, he voiced understanding. This nurse attempted to restart a new iv site, when the pt. jerked his arm away stating,  I do not want to be stuck again for a iv, I'm leaving today. Second site education re attempted, with the pt. continuing to decline at this time. Safety precautions in place. Call bell within reach. Instructed to call for assistance.

## 2024-08-12 NOTE — Evaluation (Signed)
 Occupational Therapy Evaluation Patient Details Name: [name] MRN: [id number] DOB: February 06, 1956 Today's Date: 08/12/2024   History of Present Illness   69 y.o. male presented by EMS with pain, redness and swelling of the left foot as well as dizziness and weakness, which caused him to fall hitting his head. Per podiatry PWB to LLE in offloading post op shoe. 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     Clinical Impressions Pt was seen for OT evaluation this date. PTA, pt resides at home alone with a ramp to enter where he was MOD I with ADLs and mobility. Reports 4 recent falls d/t RLE giving out on him. He was using a post op shoe at home, but has nobody to bring his in from home. Reports his house may be foreclosed on. Pt presents with deficits in strength, balance, safety awareness and pain management limiting their ability to perform ADL management at baseline level. Pt currently requires supervision for bed mobility and CGA for STS and SPT from bed>recliner using RW with cues for technique to maintain weight bearing precautions on LLE. Pt performed transfer treating LLE as NWB d/t not having his offloading post op shoe at this time. Will follow acutely to maximize safety and return to PLOF. Do anticipate the need for follow up OT services upon acute hospital DC.       If plan is discharge home, recommend the following:   A little help with walking and/or transfers;A little help with bathing/dressing/bathroom;Help with stairs or ramp for entrance;Assistance with cooking/housework     Functional Status Assessment   Patient has had a recent decline in their functional status and demonstrates the ability to make significant improvements in function in a reasonable and predictable amount of time.     Equipment Recommendations    BSC/3in1     Recommendations for Other Services         Precautions/Restrictions   Precautions Precautions: Fall Recall of Precautions/Restrictions: Intact Restrictions Weight Bearing Restrictions Per Provider Order: Yes LLE Weight Bearing Per Provider Order: Partial weight bearing LLE Partial Weight Bearing Percentage or Pounds: offloading post op shoe     Mobility Bed Mobility Overal bed mobility: Needs Assistance Bed Mobility: Supine to Sit     Supine to sit: Supervision          Transfers Overall transfer level: Needs assistance Equipment used: Rolling walker (2 wheels) Transfers: Sit to/from Stand, Bed to chair/wheelchair/BSC Sit to Stand: Contact guard assist     Step pivot transfers: Contact guard assist     General transfer comment: performed transfers as NWB since post op shoe had not arrived, good safety and technique utilized      Balance Overall balance assessment: Needs assistance Sitting-balance support: Feet supported Sitting balance-Leahy Scale: Good     Standing balance support: Reliant on assistive device for balance, Bilateral upper extremity supported Standing balance-Leahy Scale: Fair Standing balance comment: RW and CGA                           ADL either performed or assessed with clinical judgement   ADL Overall ADL's : Needs assistance/impaired     Grooming: Wash/dry face;Sitting;Set up;Brushing hair Grooming Details (indicate cue type and reason): washing/drying his hair with shower cap seated in recliner  Lower Body Dressing: Set up;Sitting/lateral leans Lower Body Dressing Details (indicate cue type and reason): donn shoe to perform transfer to recliner Toilet Transfer: Contact guard assist;Rolling walker (2 wheels) Toilet Transfer Details (indicate cue type and reason): simulated bed to recliner via hop pivot on RLE using RW with pt NWB on LLE since offloading post op shoe not here                  Vision         Perception         Praxis         Pertinent Vitals/Pain Pain Assessment Pain Assessment: Faces Faces Pain Scale: Hurts a little bit Pain Location: L foot wounds Pain Intervention(s): Monitored during session, Repositioned, Limited activity within patient's tolerance     Extremity/Trunk Assessment Upper Extremity Assessment Upper Extremity Assessment: Overall WFL for tasks assessed   Lower Extremity Assessment Lower Extremity Assessment: Generalized weakness;LLE deficits/detail LLE Deficits / Details: PWB in offloading post op shoe       Communication Communication Communication: No apparent difficulties   Cognition Arousal: Alert Behavior During Therapy: Impulsive                                 Following commands: Intact       Cueing  General Comments      ace wrap to L foot wounds, post op shoe not here (has nobody to bring his from home) medical laboratory scientific officer of need for offloading post op shoe   Exercises Other Exercises Other Exercises: Edu on role of OT in acute setting, WB precautions, use of post op shoe, etc.   Shoulder Instructions      Home Living Family/patient expects to be discharged to:: Unsure Living Arrangements: Alone                               Additional Comments: reports he has a ramp to enter his home      Prior Functioning/Environment Prior Level of Function : Independent/Modified Independent;History of Falls (last six months)             Mobility Comments: has no electricity, reports his house is being foreclosed, 4 falls in the last week (RLE giving out on him), reports he was wearing the LLE post op shoe at home but has nobody to bring it in for him ADLs Comments: repors independence    OT Problem List: Impaired balance (sitting and/or standing);Decreased safety awareness;Decreased strength   OT Treatment/Interventions: Self-care/ADL training;Therapeutic  exercise;Patient/family education;Balance training;Therapeutic activities;DME and/or AE instruction      OT Goals(Current goals can be found in the care plan section)   Acute Rehab OT Goals Patient Stated Goal: improve pain/function OT Goal Formulation: With patient Time For Goal Achievement: 08/26/24 Potential to Achieve Goals: Fair ADL Goals Pt Will Perform Lower Body Dressing: with modified independence;sitting/lateral leans;sit to/from stand Pt Will Transfer to Toilet: with modified independence;ambulating;regular height toilet;stand pivot transfer Pt Will Perform Toileting - Clothing Manipulation and hygiene: with modified independence;sitting/lateral leans;sit to/from stand   OT Frequency:  Min 2X/week    Co-evaluation              AM-PAC OT 6 Clicks Daily Activity     Outcome Measure Help from another person eating meals?: None Help from another person taking care of personal grooming?: None Help from another  person toileting, which includes using toliet, bedpan, or urinal?: A Little Help from another person bathing (including washing, rinsing, drying)?: A Little Help from another person to put on and taking off regular upper body clothing?: None Help from another person to put on and taking off regular lower body clothing?: A Little 6 Click Score: 21   End of Session Equipment Utilized During Treatment: Rolling walker (2 wheels) Nurse Communication: Mobility status  Activity Tolerance: Patient tolerated treatment well Patient left: in chair;with call bell/phone within reach  OT Visit Diagnosis: Other abnormalities of gait and mobility (R26.89);Muscle weakness (generalized) (M62.81);Pain Pain - Right/Left: Left Pain - part of body: Ankle and joints of foot                Time: 8462-8378 OT Time Calculation (min): 44 min Charges:  OT General Charges $OT Visit: 1 Visit OT Evaluation $OT Eval Moderate Complexity: 1 Mod OT Treatments $Self Care/Home  Management : 8-22 mins Milcah Dulany Chrismon, OTR/L 08/12/2024, 4:47 PM  Kyllie Pettijohn E Chrismon 08/12/2024, 4:42 PM

## 2024-08-12 NOTE — Progress Notes (Addendum)
 PHARMACY - ANTICOAGULATION CONSULT NOTE  Pharmacy Consult for UFH Indication: atrial fibrillation  Allergies[1]  Patient Measurements: Height: 5' 11 (180.3 cm) Weight: 68 kg (149 lb 14.6 oz) IBW/kg (Calculated) : 75.3 HEPARIN  DW (KG): 68  Vital Signs: Temp: 98.4 F (36.9 C) (01/02 2103) BP: 150/76 (01/02 2103) Pulse Rate: 72 (01/02 2103)  Labs: Recent Labs    08/10/24 2119 08/11/24 0845 08/11/24 0845 08/11/24 1518 08/12/24 0022 08/12/24 0843  HGB 15.1  --   --   --   --  12.6*  HCT 42.8  --   --   --   --  36.3*  PLT 308  --   --   --   --  265  APTT  --  40*  --  43*  --   --   LABPROT 14.8  --   --   --   --   --   INR 1.1  --   --   --   --   --   HEPARINUNFRC  --  0.19*   < > 0.12* <0.10* <0.10*  CREATININE 0.84  --   --   --   --   --    < > = values in this interval not displayed.    Estimated Creatinine Clearance: 82.1 mL/min (by C-G formula based on SCr of 0.84 mg/dL).   Medical History: Past Medical History:  Diagnosis Date   Adenomatous colon polyp    Allergic rhinitis    At risk for sleep apnea    STOP-BANG= 5   SENT TO PCP 03-14-2014   Bladder cancer (HCC)    CAD (coronary artery disease)    a. 07/2014 low risk MV; b. 07/2019 Cor CTA (FFR): LM 25-49 (nl), LAD mild prox/mid plaque (nl), D1 25-49p(nl w/ abnl FFR of 0.73 in inf branch), LCX/OM1 mild prox/mid plaque (nl), RCA nondominant, minimal Ca2+ plaque (nl), RPDA (mildly abnl @ 0.79)-->Med Rx..   Cataract    surgically removed bilateral   Condyloma acuminatum of penis    Diabetic neuropathy (HCC)    Diastolic dysfunction    a. 05/2019 Echo: EF 60-65%, no rwam, mod LVH, impaired relaxation, nl RV size/fxn, trace MR, triv TR.   Diverticulosis    GERD (gastroesophageal reflux disease)    History of bladder cancer    s/p  turbt  2013/   transitional cell carcinoma--    History of condyloma acuminatum    PERINEAL AREA  W/ RECURRENCY   History of gout    Hyperlipidemia    Hypertension     Hypoxemia 03/09/2024   Lower urinary tract symptoms (LUTS)    PAF (paroxysmal atrial fibrillation) (HCC)    a. 06/2019 Event monitor: PAF <1% burden. Longest 3 mins 36 secs.   Productive cough    PSVT (paroxysmal supraventricular tachycardia)    a. 06/2019 Event monitor: 112 episodes of SVT, longest 21 secs.   PVD (peripheral vascular disease) with claudication    a. 03/2014 LE art duplex: long segment occlusion of mid to distal R SFA; b. 03/2020 ABI: nl left and mildly improved R ABI->med rx.   Renal artery stenosis    Renal artery stenosis    a. 12/2020 Renal art duplex: RRA 1-59%, LRA >60%.   Smokers' cough (HCC)    Type 2 diabetes mellitus with insulin  therapy (HCC) 1992   monitor by  dr ellsion   Wears dentures    upper    Medications:  Medications Prior to Admission  Medication Sig Dispense Refill Last Dose/Taking   albuterol  (VENTOLIN  HFA) 108 (90 Base) MCG/ACT inhaler Inhale 2 puffs into the lungs every 4 (four) hours as needed for wheezing or shortness of breath. 1 each 0 Unknown   amiodarone (PACERONE) 200 MG tablet Take 200 mg by mouth daily.   08/10/2024 at 10:00 AM   atorvastatin  (LIPITOR ) 40 MG tablet TAKE 1 TABLET BY MOUTH ONCE DAILY 90 tablet 3 08/10/2024 at 10:00 AM   citalopram  (CELEXA ) 20 MG tablet Take 1 tablet (20 mg total) by mouth daily. 90 tablet 2 08/10/2024 at 10:00 AM   doxycycline  (VIBRA -TABS) 100 MG tablet Take 1 tablet (100 mg total) by mouth 2 (two) times daily. 20 tablet 0 08/10/2024 at 10:00 AM   ELIQUIS 5 MG TABS tablet Take 5 mg by mouth 2 (two) times daily.   08/10/2024 at 10:00 AM   furosemide  (LASIX ) 40 MG tablet Take 40 mg by mouth daily.   08/10/2024 at 10:00 AM   insulin  glargine, 2 Unit Dial , (TOUJEO  MAX SOLOSTAR) 300 UNIT/ML Solostar Pen Inject 40 Units into the skin every morning. 15 mL 3 08/10/2024 at 10:00 AM   irbesartan  (AVAPRO ) 300 MG tablet Take 1 tablet (300 mg total) by mouth daily. 90 tablet 3 08/10/2024 at 10:00 AM   pantoprazole   (PROTONIX ) 40 MG tablet Take 1 tablet (40 mg total) by mouth daily. 100 tablet 2 08/10/2024 at 10:00 AM   Potassium Chloride  ER 20 MEQ TBCR Take 1 tablet by mouth daily.   08/10/2024 at 10:00 AM   spironolactone  (ALDACTONE ) 25 MG tablet Take 25 mg by mouth daily.   08/10/2024 at 10:00 AM   Blood Glucose Monitoring Suppl (ACCU-CHEK GUIDE) w/Device KIT 1 Device by Does not apply route daily in the afternoon. 1 kit 0    Continuous Glucose Sensor (DEXCOM G7 SENSOR) MISC Change every 10 days 9 each 3    furosemide  (LASIX ) 20 MG tablet Take 1 tablet (20 mg total) by mouth daily. (Patient not taking: Reported on 08/11/2024) 5 tablet 0 Not Taking   glucose blood (ACCU-CHEK GUIDE TEST) test strip 1 each by Other route 3 (three) times daily. Use as instructed 300 each 12    potassium chloride  SA (KLOR-CON  M) 20 MEQ tablet Take 2 tablets (40 mEq total) by mouth daily. (Patient not taking: Reported on 08/11/2024) 14 tablet 0 Not Taking   predniSONE  (DELTASONE ) 50 MG tablet Take 1 tablet by mouth daily (Patient not taking: Reported on 08/11/2024) 4 tablet 0 Not Taking    Assessment: 69 y/o M admitted with LE cellulitis and possible osteomyelitis on apixaban PTA for AF with last dose yesterday. Pharmacy consulted to initiate heparin  bridge therapy.  Update 1/1 after baseline labs -- baseline aPTT and HL both correlating. Will titrate by heparin  level.  Goal of Therapy:  Heparin  level 0.3-0.7 units/ml aPTT 66-102 seconds Monitor platelets by anticoagulation protocol: Yes  1/1 @ 1518: aPTT 43, HL 0.12 = SUBtherapeutic at 1050 un/hr 1/2 @ 0022: HL = < 0.1, SUBtherapeutic 1/2 @ 0843: HL = < 0.1, SUBtherapeutic 1/2 @ 1959: HL = < 0.1, SUBtherapeutic   Plan:  1/2 @ 2004: HL = < 0.1, SUBtherapeutic - Bolus 2050 units and increase heparin  rate to 1900 units/hr - HL 8 hours after change - Daily CBC  Will M. Lenon, PharmD, BCPS Clinical Pharmacist 08/12/2024 10:23 PM     [1]  Allergies Allergen Reactions    Cefepime  Hives    Had allergic reaction to one of  these 3 agents, unclear which one   Metoprolol  Hives    Had allergic reaction to one of these 3 agents, unclear which one  *Per RN, highly likely this is the cause of allergic rxn   Myrbetriq  [Mirabegron ] Hives    Had allergic reaction to one of these 3 agents, unclear which one

## 2024-08-12 NOTE — Progress Notes (Addendum)
 " Progress Note   Patient: [name] FMW:[id number] DOB: 07/20/1956 DOA: 08/10/2024     2 DOS: the patient was seen and examined on 08/12/2024   Brief hospital course: 69 y.o. male with medical history significant for PAD, osteomyelitis s/p toe amputation, DM, HTN, bladder cancer, paroxysmal atrial fibrillation on Eliquis, , depression being admitted for cellulitis left foot with possible osteomyelitis.  He was admitted for the same in August 2025, undergoing amputation of the left great toe at that time, and he was treated with meropenem -> Cipro  to complete 4 weeks after OR cultures grew Pseudomonas, Enterobacter and Proteus.  Today he presented by EMS with pain and swelling of the left foot with redness as well as dizziness and weakness, which caused him to fall hitting his head on 1 occasion but without loss of consciousness.  He reports not having electricity in the house and cannot power his cell phone.  He decided to come into the ED to get checked out. In the ED, temp of 99.2 with otherwise normal vitals.  Labs notable for WBC 19,000 but otherwise unremarkable. EKG with no acute concerning findings Venous ultrasound negative for DVT Foot x-ray showed the following: IMPRESSION: 1. Soft tissue swelling along the plantar surface of the forefoot at the amputation site with subcutaneous gas. 2. Mild cortical irregularity along the medial margin of the remaining first metatarsal head, which could indicate osteomyelitis. 3. 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   The ED provider reached out to podiatrist, Dr.  Lamount who recommended Zosyn  and vancomycin  and get MRI.  No mention of OR  MRI of the foot did not show any convincing evidence of acute osteomyelitis at this time.  1/2.  Patient was contemplating leaving the hospital.  I told him we need to get lab draws if were on heparin  drip.  Assessment and Plan: * Cellulitis of left foot Also  with numerous foot wounds.  Patient started on aggressive antibiotics with vancomycin  and Zosyn .  Appreciate podiatry and vascular surgery consultations.  Angiogram on Monday.  MRI does not show any signs of osteomyelitis.  Podiatry following.  Uncontrolled type 2 diabetes mellitus with hypoglycemia, with long-term current use of insulin  [facility]) Patient had a sugar of 38 this yesterday morning.  Patient given an amp of D50.  Will allow patient to eat today.  Will put on sensitive sliding scale.  Change fingersticks to before every meal and nightly.  PAD (peripheral artery disease) Holding Eliquis for angiogram on Monday.  On heparin  drip.  PAF (paroxysmal atrial fibrillation) (HCC) Continue heparin  drip and amiodarone  CAD (coronary artery disease) Continue heparin , atorvastatin   Essential hypertension Continue irbesartan , spironolactone  and Lasix   Housing instability TOC following  Tobacco abuse Nicotine patch.  Patient must stop smoking.        Subjective: Patient initially wanted to leave the hospital.  Advised that it would be AGAINST MEDICAL ADVICE.  Continue IV antibiotics.  He needs to allow blood draws for us  to continue heparin  at this point.  He is now agreeable to stay.  I mentioned that they plan on doing an angiogram on Monday.  Physical Exam: Vitals:   08/11/24 1915 08/12/24 0407 08/12/24 0628 08/12/24 0847  BP: (!) 142/79 (!) 167/66  139/77  Pulse: 67 70  70  Resp: 16 20  17   Temp: 98.3 F (36.8 C) 98 F (36.7 C)  97.7 F (36.5 C)  TempSrc:  Oral  SpO2: 98% 96% 97% 97%  Weight:      Height:       Physical Exam HENT:     Head: Normocephalic.  Eyes:     General: Lids are normal.  Cardiovascular:     Rate and Rhythm: Normal rate and regular rhythm.     Heart sounds: Normal heart sounds, S1 normal and S2 normal.  Pulmonary:     Breath sounds: No decreased breath sounds, wheezing, rhonchi or rales.  Abdominal:     Palpations: Abdomen is soft.      Tenderness: There is no abdominal tenderness.  Musculoskeletal:     Right lower leg: No swelling.     Left lower leg: No swelling.  Skin:    General: Skin is warm.     Comments: Left foot wrapped.  Neurological:     Mental Status: He is alert and oriented to person, place, and time.     Data Reviewed: Glucose up to 152, white blood cell count 12.0, hemoglobin 12.6, platelet count 265  Disposition: Status is: Inpatient Remains inpatient appropriate because: Angiogram will be done on Monday.  Continue IV antibiotics for cellulitis.  Patient must allow blood draws while on IV heparin .  Planned Discharge Destination: Home    Time spent: 28 minutes  Author: Charlie Patterson, MD 08/12/2024 1:13 PM  For on call review [email or web address].  "

## 2024-08-12 NOTE — Discharge Instructions (Addendum)
 Finish antibiotic course as prescribed Continue with local wound care Follow up with podiatry clinic within 1 week Follow up with PCP within 1 week  Food Resources  Agency Name: [facility] Agency Address: [address] [place], [place] [address] Phone: [phone] Website: [email or web address] Service(s) Offered: Housing services, self-sufficiency, congregate meal program, weatherization program, event organiser program, emergency food assistance,  housing counseling, home ownership program, wheels - to work program.  Dole Food free for 60 and older at various locations from usaa, Monday-Friday:  [employer], [address] [place], [phone] -[facility], [address] [place] [phone]  -[facility], [address] [place] [phone]  -[address], [place], [phone]  Agency Name: [facility] on Wheels Address: [address], [place], [place] [address] Phone: [phone] Website: [email or web address] Service(s) Offered: Home delivered hot, frozen, and emergency  meals. Grocery assistance program which matches  volunteers one-on-one with seniors unable to grocery shop  for themselves. Must be 60 years and older; less than 20  hours of in-home aide service, limited or no driving ability;  live alone or with someone with a disability; live in  [place].  Agency Name: Ecologist [facility]) Address: [address], [place], [place] [address] Phone: [phone] Service(s) Offered: Food is served to shut-ins, homeless, elderly, and low income people in the community every Saturday (11:30 am-12:30 pm) and Sunday (12:30 pm-1:30pm). Volunteers also offer help and encouragement in seeking employment,  and spiritual guidance.  Agency Name: Department of Social Services Address: [address] [place], [place]  [address] Phone: [phone] Service(s) Offered: Child support services; child welfare services; food stamps; Medicaid; work first family assistance; and aid with fuel,  rent, food and medicine.  Agency Name: Dietitian Address: [address] [place], [place] Phone: [phone] Website: [email or web address] Services Offered: Monday 10:00am-12:00, 8:00pm-9:00pm, and Friday 10:00am-12:00.  Agency Name: [facility] [place] Address: [address] [place], [place] [address] Phone: [phone] Website: www.[email or web address] Service(s) Offered: Serves weekday meals, open from 11:30 am- 1:00 pm., and 6:30-7:30pm, Monday-Wednesday-Friday distributes food 3:30-6pm, Monday-Wednesday-Friday.  Agency Name: [facility] Address: [address], [place], [place] Phone: [phone] Website: [email or web address] Services Offered: Distributes food the 4th Saturday of the month, starting at 8:00 am  Agency Name: [facility] Address: [id number] [address] [place], [place] [address] Phone: [phone] Website: [email or web address] Service(s) Offered: Bread of life, weekly food pantry. Open Wednesdays from 10:00am-noon.  Agency Name: [facility] Address: [address] [place], [place], [place] Phone: [phone] Services Offered: Distributes food 9am-1pm, Monday-Thursday. Call for details.  Agency Name: [facility] Address: [address] [place], [place] [address] Phone: [phone] Website: [email or web address] Service(s) Offered: Games Developer. Call for assistance.  Agency Name: [facility] Address: [address] [place], [place] [address] Phone: [phone] Service Offered: Emergency Food Pantry. Call for appointment.  Agency Name: [facility] Address: [address] [place], [place] [address] Phone: [phone] Website:  [email or web address] Services Offered: Games Developer. Call for details  Agency Name: [facility] Address: [address] [place], [place] Phone: [phone] Website: [email or web address] Service(s) Offered: Emergency Food Pantry. Call for details.  Agency Name: Holiday Representative Address: [address] [place], [place] [address] Phone: [phone] or [phone] Website: [email or web address] Service(s) Offered: Distribute food 9am-11:30 am, Tuesday-Friday, and 1-3:30pm, Monday-Friday. Food pantry Monday-Friday 1pm-3pm, fresh items, Mon.-Wed.-Fri.  Agency Name:  [facility] Address: [address] [place] [address] [place], [place] [address] Phone: [phone] Website: www.[email or web address] Services Offered: Distribute food Tues and Sats from 9:00am-noon. Closed 1st Saturday of each month. Call for details  Agency Name: [facility] [employer] Address: Fayrene Boatman [facility] [address] [place] [address] Phone: [phone]  Services Offered: Delivers meals every Thursday   Rent/Utility/Housing  Agency Name: [facility] Agency Address: [address] [place], [place] [address] Phone: [phone] Email: [email or web address] Website: [email or web address] Service(s) Offered: Housing services, self-sufficiency, congregate meal program, weatherization program, field seismologist program, emergency food assistance,  housing counseling, home ownership program, wheels -towork program.  Agency Name: [facility] Address: [address], [place], [place] [address] Phone: [phone] (8a-4p) [phone] (8p- 10p) Email: [email or web address] Website: [email or web address] Service(s) Offered: A program for homeless and/or needy men that includes one-on-one counseling, life skills training and job rehabilitation.  Agency Name: [facility] [place] Address:  [address], [place], [place] [address] Phone: [phone] Website: www.[email or web address] Service(s) Offered: Assistance to needy in emergency with utility bills, heating fuel, and prescriptions. Shelter for homeless 7pm-7am. December 04, 2016 15  Agency Name: [facility] [place] (Developmentally Disabled) Address: [address], [place], [place] [address] Phone: [phone] Contact Person: Lemond Cart Email: [email or web address] Website: [email or web address] Service(s) Offered: Helps individuals with developmental disabilities move from housing that is more restrictive to homes where they  can achieve greater independence and have more  opportunities.  Agency Name: [facility] Address: [address], [place], [place] [address] Phone: [phone] Email: [email or web address] Website: [email or web address].org Service(s) Offered: Provides affordable housing for low-income families, elderly, and disabled individuals. Offer a wide range of  programs and services, from financial planning to afterschool and summer programs.  Agency Name: Department of Social Services Address: [address] [place], [place] [address] Phone: [phone] Service(s) Offered: Child support services; child welfare services; food stamps; Medicaid; work first family assistance; and aid with fuel,  rent, food and medicine.  Agency Name: [facility] [place], [employer]. Address: [facility] [address], [place], [place]  [address] Phone: [phone] Website: www.familyabuseservices.org Service(s) Offered: 24 hour Crisis Line: [phone]; 24 hour Emergency Shelter; Transitional Housing; Support Groups; Scientist, Physiological; [employer]; Hispanic Outreach: [phone];  Visitation Center: [phone].  Agency Name: [facility] Address: [address] [place], [place] [address] Phone: [phone] Service(s) Offered: CAP Services;  Home and [employer]; Individual or Group Supports; Respite Care Non-Institutional Nursing;  Residential Supports; Respite Care and Personal Care Services; Transportation; Family and Friends Night; Recreational Activities; Three Nutritious Meals/Snacks; Consultation with Registered Dietician; Twenty-four hour Registered Nurse Access; Daily and [employer]; Camp Green Leaves; [place] for the [employer] (During Summer Months) Bingo Night (Every  Wednesday Night); Special Populations Dance Night  (Every Tuesday Night); Professional Hair Care Services.  Agency Name: [facility] Address: [address], [place], [place] [address] Phone: [phone] Contact Person: Meade High Website: [email or web address], Medical) Offered: Residential treatment facility for women; food and  clothing, educational & employment development and  transportation to work; counsellor of financial skills;  parenting and family reunification; emotional and spiritual  support; transitional housing for program graduates.  Agency Name: [facility] Address: [address], [place], [place] [address] Phone: [phone] Email: [email or web address] Website: [email or web address] Service(s) Offered: Public housing units for elderly, disabled, and low income people; housing choice vouchers for income eligible  applicants; shelter plus care vouchers; and  HOPWA voucher program.  Agency Name: [facility] Address: [address] [place], [place] [address] Phone: [phone] Email: [email or web address] Website: www.habitatalamance.org Service(s) Offered: Build houses for families in need of decent housing. Each adult in the family must invest 200 hours of labor on  someone elses house, work with volunteers to build their own house, attend classes on budgeting, home maintenance, yard care, and attend homeowner  association meetings.  Agency Name: [facility] Address: [address], [place], [place] [address] Phone: [phone] Website: www.rsli.org Service(s) Offered: Intermediate care facilities for intellectually delayed, Supervised Living in group homes for adults with developmental disabilities, Supervised Living for people who have dual diagnoses (MRMI), Independent Living, Supported Living, respite and a variety of CAP services, pre-vocational services, day supports, and [employer].  Agency Name: [place] [facility] Phone: [phone] Website: www.NCForeclosurePrevention.gov Service(s) Offered: Zero-interest, deferred loans to homeowners struggling to pay their mortgage. Call for more information.     Transportation Resources for [employer]  Agency Name: [facility] Agency Address: [address] [place], [place] [address] Phone: [phone] Email: [email or web address] Website: www.[email or web address].org Service(s) Offered: Housing services, self-sufficiency, congregate meal program, weatherization program, field seismologist program, emergency food assistance,  housing counseling, home ownership program, wheels-towork program.  Agency Name: [facility] [id number]) Address: [address] [place], [place] [address] Phone: [phone] Website: www.acta-[email or web address].com Service(s) Offered: Transportation for bluelinx, subscription and demand response; Dial -a-Ride for citizens 64 years of age or older.  Agency Name: Department of Social Services Address: [address] [place], [place] [address] Phone: [phone] Service(s) Offered: Child support services; child welfare services; food stamps; Medicaid; work first family assistance; and aid with fuel,  rent, food and medicine, transportation assistance.  Agency Name: Disabled [employer] (DAV)  Transportation  Network Phone: [phone] Service(s) Offered: Transports veterans to the [facility]. Call  forty-eight hours in advance and leave the name, telephone  number, date, and time of appointment. Veteran will be  contacted by the driver the day before the appointment to  arrange a pick up point    [employer] ACTA currently provides door to door services. ACTA connects with PART daily for services to [facility]. ACTA also performs contract services to [employer] operates 27 vehicles, all but 3 mini-vans are equipped with lifts for special needs as well as the general public. ACTA drivers are each CDL certified and trained in First Aid and CPR. ACTA was established in 2002 by [employer]. An independent Industrial/product Designer. ACTA operates via cytogeneticist with required local 10% match funding from [place]. ACTA provides over 80,000 passenger trips each year, including [facility] and Winn[employer] sites.  Call at least by 11 AM one business day prior to needing transportation  [employer].                      [place], [place] [address]     Office Hours: Monday-Friday  8 AM - 5 PM   Agency Name: [facility] Agency Address: [address] [place], [place] [address] Phone: [phone] Website: www.alamanceservices.org Service(s) Offered: Housing services, self-sufficiency, congregate meal program, and individual development account program.  Agency Name: [facility] [place] Address: [address], [place], [place] [address] Phone: [phone] Email: [email or web address] Website: www.alliedchurches.org Service(s) Offered: Housing the  homeless, feeding the hungry, company secretary, job and education related services.  Agency Name: [facility] Address: [address], [place], [place] [address] Phone: [phone] Email: [email or web address] Service(s) Offered: Counseling, problem pregnancy, advocacy for Hispanics, limited emergency financial assistance.  Agency Name: Department of Social Services Address: [address] [place], [place] [address] Phone: [phone] Website: [email or web address] Service(s) Offered: Child support services; child welfare services; SNAP; Medicaid; work first family assistance; and aid with fuel,  rent, food and medicine.  Agency Name: Holiday Representative Address: [address] [place], [place] [address] Phone: [phone] or [phone] Email: [email or web address] Service(s) Offered: Family services and transient assistance; emergency food, fuel, clothing, limited furniture, utilities; budget counseling, general counseling; give a kid a coat; thrift store; Christmas food and toys. Utility assistance, food pantry, rental  assistance, life sustaining medicine   Do you feel isolated?  The Institute on Aging offers a [employer] that anyone can call toll free at [phone]. The friendship line is available 24 hours a day  [place] is a Program of All-inclusive Care for the Elderly (PACE). Their mission is to promote and sustain the independence of seniors wishing to remain in the community. They provide seniors with comprehensive long-term health, social, medical and dietary care. Their program is a safe alternative to nursing home care. [phone]  [facility] Physical Address [facility] [address] [place], [place] [address] Phone: [phone]. . Online zoom yoga class, connect with others without leaving your home [facility] offers [place] dance cardio sessions for individuals via Zoom. This program provides: - Dance fitness activities Please contact program for more information. Servinganyone in need adults 18+ hiv/aids  individuals families Call [phone]  Email [email or web address] to get more info  Humana offers an online T[employer] Brothers to individuals where they can receive help to focus on their best health. Whether you're a Humana member or not, the neighborhood center offers a... Main Serviceshealth education  exercise & fitness  community support services  recreation  virtual support Other Servicessupport groups Servinganyone in need adults young adults teens seniors individuals families [email or web address] to get more info  Schedule on their website  [facility] offers an array of activities for adults age 29 and over. This program provides:- Fitness and health programs- Tech classes- Activity books Main Serviceshealth education  community support services  exercise & fitness  recreation  more education Servingseniors  Call [phone]    For more resources go online to Rhodeislandbargains.co.uk and type in you zipcode   ?? Special Educational Needs Teacher  ?? [employer] Senior Adults Association, Conocophillips serving older adults (50+) in [place] with four senior center locations ([place], [place], [place], and [place]). Provides congregate meals, home-delivered meals (Meals-on-Wheels), social and recreational activities, transportation assistance, health insurance counseling [facility]), caregiver support, prescription assistance, outreach, and more.  [facility] & Wholeness  ?? Phone: [phone] (main senior center line)  [employer]  ?? Psychiatrist center in [place] offering a wide variety of activities, congregate meals, games, classes, and social programs for older adults. Operated under the [facility] Adults Association umbrella.  Senior Center  ?? Phone: [phone]  Senior Center  ?? Radio Producer center in [place] providing daily pathmark stores,  activities such as bingo, exercise, crafts, and health checks. Offers community activities and access to services through [employer].  [facility] [place]  ?? Phone: [phone]  [facility] [place]  ?? [facility] Senior Adult ([place]) [place] location of the senior center (part of the [employer]), offering activities, meals, and social support.  [facility]  ?? Phone: [phone] (through the main association)  [facility]  Other local centers in the county:  [facility] - Phone: [phone]  [facility]  ??? Additional Support and Programs  While not traditional senior centers, the following also support older adults in [place]:  Walgreen - Offers fitness and senior-friendly classes like Silver Sneakers (may require membership)  [employer]  [facility] - Adult day services (respite and social engagement)  seniorcenterdirectory.com  [facility] Senior Care - PACE program (comprehensive care and adult day support) for eligible older adults    ?? StayWell PACE: [phone]  [facility]  ?? Summary  Many of the countys senior services operate under the [employer], which runs multiple senior centers offering meals, social activities, health programs, transportation help, and outreach for older adults in the community.  [facility]ners in Health & Wholeness  Food Resources  Agency Name: [facility] Agency Address: [address] [place], [place] [address] Phone: [phone] Website: www.alamanceservices.org Service(s) Offered: Housing services, self-sufficiency, congregate meal program, weatherization program, event organiser program, emergency food assistance,  housing counseling, home ownership program, wheels - to work program.  Dole Food free for 60 and older at various  locations from usaa, Monday-Friday:  [employer], [address] [place], [phone] -[facility], [address] [place] [phone]  -[facility] [place] [facility], [address], [place] [phone]  -[address], [place], [phone]  Agency Name: [facility] on Wheels Address: [id number], [place], [place] [address] Phone: [phone] Website: www.[email or web address] Service(s) Offered: Home delivered hot, frozen, and emergency  meals. Grocery assistance program which matches  volunteers one-on-one with seniors unable to grocery shop  for themselves. Must be 60 years and older; less than 20  hours of in-home aide service, limited or no driving ability;  live alone or with someone with a disability; live in  [facility].  Agency Name: [facility]) Address: [address] [place], [place] [address] Phone: [phone] Service(s) Offered: Food is served to shut-ins, homeless, elderly, and low income people in the community every Saturday (11:30 am-12:30 pm) and Sunday (12:30 pm-1:30pm). Volunteers also offer help and encouragement in seeking employment,  and spiritual guidance.  Agency Name: Department of Social Services Address: [address] [place], [place] [address] Phone: [phone] Service(s) Offered: Child support services; child welfare services; food stamps; Medicaid; work first family assistance; and aid with fuel,  rent, food and medicine.  Agency Name: Dietitian Address: [address], [place], [place] Phone: [phone] Website: www.[email or web address] Services Offered: Monday 10:00am-12:00, 8:00pm-9:00pm, and Friday 10:00am-12:00.  Agency Name: [facility] [place] Address: [address], [place], [place] [address] Phone: [phone] Website: www.alliedchurches.org Service(s) Offered: Serves weekday meals, open  from 11:30 am- 1:00 pm., and 6:30-7:30pm, Monday-Wednesday-Friday distributes food 3:30-6pm, Monday-Wednesday-Friday.  Agency Name: [facility] Address: [address], [place], [place] Phone: [phone] Website: www.[email or web address] Services Offered: Distributes food the 4th Saturday of the month, starting at 8:00 am  Agency Name: [facility] Address: [phone] [address] [place], [place] [address] Phone: [phone] Website: [email or web address] Service(s) Offered: Bread of life, weekly food pantry. Open Wednesdays from 10:00am-noon.  Agency  Name: [facility] [employer] [facility] Address: [address] [place], [place], [place] Phone: [phone] Services Offered: Distributes food 9am-1pm, Monday-Thursday. Call for details.  Agency Name: [facility] Address: [address] [place], [place] [address] Phone: [phone] Website: [email or web address].[email or web address] Service(s) Offered: Games Developer. Call for assistance.  Agency Name: [facility] Address: [address], [place], [place] [address] Phone: [phone] Service Offered: Emergency Food Pantry. Call for appointment.  Agency Name: [facility] Address: [address] [place], [place] [address] Phone: [phone] Website: [email or web address] Services Offered: Games Developer. Call for details  Agency Name: [facility] Address: [address] [place], [place] Phone: [phone] Website: [email or web address] Service(s) Offered: Emergency Food Pantry. Call for details.  Agency Name: Holiday Representative Address: [address] [place], [place] [address] Phone: [phone] or [phone] Website: [email or web address] Service(s) Offered: Distribute food 9am-11:30 am, Tuesday-Friday, and 1-3:30pm, Monday-Friday. Food pantry Monday-Friday 1pm-3pm, fresh items, Mon.-Wed.-Fri.  Agency Name: [facility] ([facility]E) Address: [address] [place], [place] [address] Phone: [phone] Website: www.[email or web address] Services Offered: Distribute food Tues and Sats from 9:00am-noon. Closed 1st Saturday of each month. Call for details  Agency Name: [employer] Address: Fayrene Ava [facility] [address] [place] [address] Phone: [phone]  Services Offered: Delivers meals every Thursday  Rent/Utility/Housing  Agency Name: [facility] Agency Address: [address] [place], [place] [address] Phone: [phone] Email: [email or web address] Website: [email or web address] Service(s) Offered: Housing services, self-sufficiency, congregate meal program, weatherization program, field seismologist program, emergency food assistance,  housing counseling, home ownership program, wheels -towork program.  Agency Name: [facility] Address: [address], [place], [place] [address] Phone: [phone] (8a-4p) [phone] (8p- 10p) Email: [email or web address] Website: [email or web address] Service(s) Offered: A program for homeless and/or needy men that includes one-on-one counseling, life skills training and job rehabilitation.  Agency Name: [facility] Address: [address], [place], [place] [address] Phone: [phone] Website: www.[email or web address].org Service(s) Offered: Assistance to needy in emergency with utility bills, heating fuel, and prescriptions. Shelter for homeless 7pm-7am. December 04, 2016 15  Agency Name: [facility] [place] (Developmentally Disabled) Address: [address], [place], [place] [address] Phone: [phone] Contact Person: Lemond Cart Email: [email or web address] Website: [email or web address] Service(s) Offered: Helps individuals with developmental disabilities move from housing that is more restrictive to homes where they  can  achieve greater independence and have more  opportunities.  Agency Name: [facility] Address: [address], [place], [place] [address] Phone: [phone] Email: [email or web address] Website: [email or web address].org Service(s) Offered: Provides affordable housing for low-income families, elderly, and disabled individuals. Offer a wide range of  programs and services, from financial planning to afterschool and summer programs.  Agency Name: [facility] Address: [address] [place], [place] [address] Phone: [phone] Service(s) Offered: Child support services; child welfare services; food stamps; Medicaid; work first family assistance; and aid with fuel,  rent, food and medicine.  Agency Name: [facility] [place] [employer]. Address: [facility] [address] [place], [place]  [address] Phone: [phone] Website: www.familyabuseservices.org Service(s) Offered: 24 hour Crisis Line: [phone]; 24 hour Emergency Shelter; Transitional Housing; Support Groups; Scientist, Physiological; [employer]; Hispanic Outreach: [phone];  Visitation Center: [phone].  Agency Name: [facility] [place]. Address: [address] [place], [place] [address] Phone: [phone] Service(s) Offered: CAP Services; Home and [employer]; Individual or Group Supports; Respite Care Non-Institutional Nursing;  Residential Supports;  Respite Care and Personal Care Services; Transportation; Family and Friends Night; Recreational Activities; Three Nutritious Meals/Snacks; Consultation with Registered Dietician; Twenty-four hour Registered Nurse Access; Daily and [employer]; Camp [place] Leaves; [place] for the [employer] (During Summer Months) Bingo Night (Every  Wednesday Night); Special Populations Dance Night  (Every Tuesday Night); Professional Hair Care Services.  Agency Name: [facility] Address: [address], [place], [place] [address] Phone: [phone] Contact Person: Meade High Website: [email or web address].Physicist, Medical) Offered: Residential treatment facility for women; food and  clothing, educational & employment development and  transportation to work; counsellor of financial skills;  parenting and family reunification; emotional and spiritual  support; transitional housing for program graduates.  Agency Name: [facility] Address: [address] [place], [place] [address] Phone: [phone] Email: [email or web address] Website: [email or web address] Service(s) Offered: Public housing units for elderly, disabled, and low income people; housing choice vouchers for income eligible  applicants; shelter plus care vouchers; and Psychologist, Clinical.  Agency Name: [facility] [employer] Address: [address] [place], [place] [address] Phone: [phone] Email: [email or web address] Website: www.habitatalamance.org Service(s) Offered: Build houses for families in need of decent housing. Each adult in the family must invest 200 hours of labor on  someone elses house, work with volunteers to build their own house, attend classes on budgeting, home maintenance, yard care, and attend homeowner association meetings.  Agency Name: [facility] Address: [address] [place], [place] [address] Phone: [phone] Website: www.rsli.org Service(s) Offered: Intermediate care facilities for intellectually delayed, Supervised Living in group homes for adults with developmental disabilities, Supervised Living for people who have dual diagnoses (MRMI), Independent Living, Supported Living, respite and a variety of CAP services, pre-vocational services, day supports, and [employer].  Agency Name: [place] [employer] [facility] Phone: [phone] Website:  www.NCForeclosurePrevention.gov Service(s) Offered: Zero-interest, deferred loans to homeowners struggling to pay their mortgage. Call for more information.    Transportation Resources for [employer]  Agency Name: [facility] Address: [address] [place], [place] [address] Phone: [phone] Email: [email or web address] Website: [email or web address] Service(s) Offered: Housing services, self-sufficiency, congregate meal program, weatherization program, field seismologist program, emergency food assistance,  housing counseling, home ownership program, wheels-towork program.  Agency Name: [facility] [phone]) Address: [address], [place], [place] [address] Phone: [phone] Website: [email or web address] Service(s) Offered: Transportation for bluelinx, subscription and demand response; Dial -a-Ride for citizens 80 years of age or older.  Agency Name: Department of Social Services Address: [address], [place] [address] Phone: [phone] Service(s) Offered: Child support services; child welfare services; food stamps; Medicaid; work first family assistance; and aid with fuel,  rent, food and medicine, transportation assistance.  Agency Name: Disabled [employer] (DAV) Transportation  Network Phone: [phone] Service(s) Offered: Transports veterans to the [facility] medical center. Call  forty-eight hours in advance and leave the name, telephone  number, date, and time of appointment. Veteran will be  contacted by the driver the day before the appointment to  arrange a pick up point    [employer] ACTA currently provides door to door services. ACTA connects with PART daily for services to [facility]. ACTA also performs contract services to [employer] operates 27 vehicles, all but 3 mini-vans are equipped with lifts for  special needs as well as the general public. ACTA drivers are each CDL certified and trained in First Aid and CPR. ACTA  was established in 2002 by [employer]. An independent Industrial/product Designer. ACTA operates via cytogeneticist with required local 10% match funding from [place]. ACTA provides over 80,000 passenger trips each year, including [facility] and [employer] sites.  Call at least by 11 AM one business day prior to needing transportation  [employer].                      [place], [place] [address]     Office Hours: Monday-Friday  8 AM - 5 PM  Agency Name: [facility] Address: [address] [place], [place] [address] Phone: [phone] Website: www.[email or web address] Service(s) Offered: Housing services, self-sufficiency, congregate meal program, and individual development account program.  Agency Name: [facility] [place] Address: [address] [place], [place] [address] Phone: [phone] Email: [email or web address] Website: www.[email or web address] Service(s) Offered: Housing the homeless, feeding the hungry, company secretary, job and education related services.  Agency Name: [facility] Address: [address], [place], [place] [address] Phone: [phone] Email: [email or web address] Service(s) Offered: Counseling, problem pregnancy, advocacy for Hispanics, limited emergency financial assistance.  Agency Name: Department of Social Services Address: [address] [place], [place] [address] Phone: [phone] Website: [email or web address] Service(s) Offered: Child support services; child welfare services; SNAP; Medicaid; work first family assistance; and aid with fuel,  rent, food and medicine.  Agency Name: [facility] Address: [address], [place], [place]  [address] Phone: [phone] or [phone] Email: [email or web address] Service(s) Offered: Family services and transient assistance; emergency food, fuel, clothing, limited furniture, utilities; budget counseling, general counseling; give a kid a coat; thrift store; Christmas food and toys. Utility assistance, food pantry, rental  assistance, life sustaining medicine  Do you feel isolated?  The [facility] offers a [employer] that anyone can call toll free at [phone]. The friendship line is available 24 hours a day  [facility] is a Program of All-inclusive Care for the Elderly (PACE). Their mission is to promote and sustain the independence of seniors wishing to remain in the community. They provide seniors with comprehensive long-term health, social, medical and dietary care. Their program is a safe alternative to nursing home care. [phone]  [facility] Address [facility] [address] [place], [place] [address] Phone: [phone]. . Online zoom yoga class, connect with others without leaving your home [facility] offers [place] dance cardio sessions for individuals via Zoom. This program provides: - Dance fitness activities Please contact program for more information. Servinganyone in need adults 18+ hiv/aids individuals families Call [phone]  Email [email or web address] to get more info  [employer] offers an online [employer] to individuals where they can receive help to focus on their best health. Whether you're a [employer] member or not, the neighborhood center offers a... Main Serviceshealth education  exercise & fitness  community support services  recreation  virtual support Other Servicessupport groups Servinganyone in need adults young adults teens seniors individuals families [email or web address] to get more info  Schedule on their website  The [facility] offers an array of activities for adults age 5 and over. This program provides:- Fitness and health programs- Tech classes- Activity books Main Serviceshealth education  community support services  exercise & fitness  recreation  more education Servingseniors  Call [phone]    For more resources go online to  [email or web address] and type in you zipcode

## 2024-08-12 NOTE — Plan of Care (Signed)

## 2024-08-12 NOTE — Progress Notes (Signed)
 PHARMACY - ANTICOAGULATION CONSULT NOTE  Pharmacy Consult for UFH Indication: atrial fibrillation  Allergies[1]  Patient Measurements: Height: 5' 11 (180.3 cm) Weight: 68 kg (149 lb 14.6 oz) IBW/kg (Calculated) : 75.3 HEPARIN  DW (KG): 68  Vital Signs: Temp: 98.3 F (36.8 C) (01/01 1915) Temp Source: Oral (01/01 1625) BP: 142/79 (01/01 1915) Pulse Rate: 67 (01/01 1915)  Labs: Recent Labs    08/10/24 2119 08/11/24 0845 08/11/24 1518 08/12/24 0022  HGB 15.1  --   --   --   HCT 42.8  --   --   --   PLT 308  --   --   --   APTT  --  40* 43*  --   LABPROT 14.8  --   --   --   INR 1.1  --   --   --   HEPARINUNFRC  --  0.19* 0.12* <0.10*  CREATININE 0.84  --   --   --     Estimated Creatinine Clearance: 82.1 mL/min (by C-G formula based on SCr of 0.84 mg/dL).   Medical History: Past Medical History:  Diagnosis Date   Adenomatous colon polyp    Allergic rhinitis    At risk for sleep apnea    STOP-BANG= 5   SENT TO PCP 03-14-2014   Bladder cancer (HCC)    CAD (coronary artery disease)    a. 07/2014 low risk MV; b. 07/2019 Cor CTA (FFR): LM 25-49 (nl), LAD mild prox/mid plaque (nl), D1 25-49p(nl w/ abnl FFR of 0.73 in inf branch), LCX/OM1 mild prox/mid plaque (nl), RCA nondominant, minimal Ca2+ plaque (nl), RPDA (mildly abnl @ 0.79)-->Med Rx..   Cataract    surgically removed bilateral   Condyloma acuminatum of penis    Diabetic neuropathy (HCC)    Diastolic dysfunction    a. 05/2019 Echo: EF 60-65%, no rwam, mod LVH, impaired relaxation, nl RV size/fxn, trace MR, triv TR.   Diverticulosis    GERD (gastroesophageal reflux disease)    History of bladder cancer    s/p  turbt  2013/   transitional cell carcinoma--    History of condyloma acuminatum    PERINEAL AREA  W/ RECURRENCY   History of gout    Hyperlipidemia    Hypertension    Hypoxemia 03/09/2024   Lower urinary tract symptoms (LUTS)    PAF (paroxysmal atrial fibrillation) (HCC)    a. 06/2019 Event  monitor: PAF <1% burden. Longest 3 mins 36 secs.   Productive cough    PSVT (paroxysmal supraventricular tachycardia)    a. 06/2019 Event monitor: 112 episodes of SVT, longest 21 secs.   PVD (peripheral vascular disease) with claudication    a. 03/2014 LE art duplex: long segment occlusion of mid to distal R SFA; b. 03/2020 ABI: nl left and mildly improved R ABI->med rx.   Renal artery stenosis    Renal artery stenosis    a. 12/2020 Renal art duplex: RRA 1-59%, LRA >60%.   Smokers' cough (HCC)    Type 2 diabetes mellitus with insulin  therapy (HCC) 1992   monitor by  dr ellsion   Wears dentures    upper    Medications:  Medications Prior to Admission  Medication Sig Dispense Refill Last Dose/Taking   albuterol  (VENTOLIN  HFA) 108 (90 Base) MCG/ACT inhaler Inhale 2 puffs into the lungs every 4 (four) hours as needed for wheezing or shortness of breath. 1 each 0 Unknown   amiodarone (PACERONE) 200 MG tablet Take 200 mg by mouth  daily.   08/10/2024 at 10:00 AM   atorvastatin  (LIPITOR ) 40 MG tablet TAKE 1 TABLET BY MOUTH ONCE DAILY 90 tablet 3 08/10/2024 at 10:00 AM   citalopram  (CELEXA ) 20 MG tablet Take 1 tablet (20 mg total) by mouth daily. 90 tablet 2 08/10/2024 at 10:00 AM   doxycycline  (VIBRA -TABS) 100 MG tablet Take 1 tablet (100 mg total) by mouth 2 (two) times daily. 20 tablet 0 08/10/2024 at 10:00 AM   ELIQUIS 5 MG TABS tablet Take 5 mg by mouth 2 (two) times daily.   08/10/2024 at 10:00 AM   furosemide  (LASIX ) 40 MG tablet Take 40 mg by mouth daily.   08/10/2024 at 10:00 AM   insulin  glargine, 2 Unit Dial , (TOUJEO  MAX SOLOSTAR) 300 UNIT/ML Solostar Pen Inject 40 Units into the skin every morning. 15 mL 3 08/10/2024 at 10:00 AM   irbesartan  (AVAPRO ) 300 MG tablet Take 1 tablet (300 mg total) by mouth daily. 90 tablet 3 08/10/2024 at 10:00 AM   pantoprazole  (PROTONIX ) 40 MG tablet Take 1 tablet (40 mg total) by mouth daily. 100 tablet 2 08/10/2024 at 10:00 AM   Potassium Chloride  ER 20  MEQ TBCR Take 1 tablet by mouth daily.   08/10/2024 at 10:00 AM   spironolactone  (ALDACTONE ) 25 MG tablet Take 25 mg by mouth daily.   08/10/2024 at 10:00 AM   Blood Glucose Monitoring Suppl (ACCU-CHEK GUIDE) w/Device KIT 1 Device by Does not apply route daily in the afternoon. 1 kit 0    Continuous Glucose Sensor (DEXCOM G7 SENSOR) MISC Change every 10 days 9 each 3    furosemide  (LASIX ) 20 MG tablet Take 1 tablet (20 mg total) by mouth daily. (Patient not taking: Reported on 08/11/2024) 5 tablet 0 Not Taking   glucose blood (ACCU-CHEK GUIDE TEST) test strip 1 each by Other route 3 (three) times daily. Use as instructed 300 each 12    potassium chloride  SA (KLOR-CON  M) 20 MEQ tablet Take 2 tablets (40 mEq total) by mouth daily. (Patient not taking: Reported on 08/11/2024) 14 tablet 0 Not Taking   predniSONE  (DELTASONE ) 50 MG tablet Take 1 tablet by mouth daily (Patient not taking: Reported on 08/11/2024) 4 tablet 0 Not Taking    Assessment: 69 y/o M admitted with LE cellulitis and possible osteomyelitis on apixaban PTA for AF with last dose yesterday. Pharmacy consulted to initiate heparin  bridge therapy.  Update 1/1 after baseline labs -- baseline aPTT and HL both correlating. Will titrate by heparin  level.  Goal of Therapy:  Heparin  level 0.3-0.7 units/ml aPTT 66-102 seconds Monitor platelets by anticoagulation protocol: Yes  1/1 @ 1518: aPTT 43, HL 0.12 = SUBtherapeutic at 1050 un/hr 1/2 @ 0022: HL = < 0.1, SUBtherapeutic   Plan:  1/2:  HL @ 0022 = < 0.1, SUBtherapeutic - HL remains low despite previous rate increases.  RN reports heparin  gtt has been running without interruption - Will order heparin  2050 units IV X 1 bolus and increase drip rate to 1350 units/hr - Daily CBC  Aunesty Tyson D Clinical Pharmacist 08/12/2024 2:24 AM      [1]  Allergies Allergen Reactions   Cefepime  Hives    Had allergic reaction to one of these 3 agents, unclear which one   Metoprolol  Hives    Had  allergic reaction to one of these 3 agents, unclear which one  *Per RN, highly likely this is the cause of allergic rxn   Myrbetriq  [Mirabegron ] Hives    Had allergic reaction to  one of these 3 agents, unclear which one

## 2024-08-13 DIAGNOSIS — I739 Peripheral vascular disease, unspecified: Secondary | ICD-10-CM | POA: Diagnosis not present

## 2024-08-13 DIAGNOSIS — E11649 Type 2 diabetes mellitus with hypoglycemia without coma: Secondary | ICD-10-CM | POA: Diagnosis not present

## 2024-08-13 DIAGNOSIS — I480 Paroxysmal atrial fibrillation: Secondary | ICD-10-CM | POA: Diagnosis not present

## 2024-08-13 DIAGNOSIS — L03116 Cellulitis of left lower limb: Secondary | ICD-10-CM | POA: Diagnosis not present

## 2024-08-13 LAB — CBC
HCT: 35.4 % — ABNORMAL LOW (ref 39.0–52.0)
Hemoglobin: 12.1 g/dL — ABNORMAL LOW (ref 13.0–17.0)
MCH: 31.5 pg (ref 26.0–34.0)
MCHC: 34.2 g/dL (ref 30.0–36.0)
MCV: 92.2 fL (ref 80.0–100.0)
Platelets: 296 K/uL (ref 150–400)
RBC: 3.84 MIL/uL — ABNORMAL LOW (ref 4.22–5.81)
RDW: 14 % (ref 11.5–15.5)
WBC: 11 K/uL — ABNORMAL HIGH (ref 4.0–10.5)
nRBC: 0 % (ref 0.0–0.2)

## 2024-08-13 LAB — BASIC METABOLIC PANEL WITH GFR
Anion gap: 8 (ref 5–15)
BUN: 15 mg/dL (ref 8–23)
CO2: 29 mmol/L (ref 22–32)
Calcium: 8.6 mg/dL — ABNORMAL LOW (ref 8.9–10.3)
Chloride: 98 mmol/L (ref 98–111)
Creatinine, Ser: 0.83 mg/dL (ref 0.61–1.24)
GFR, Estimated: >60 mL/min
Glucose, Bld: 232 mg/dL — ABNORMAL HIGH (ref 70–99)
Potassium: 3.7 mmol/L (ref 3.5–5.1)
Sodium: 135 mmol/L (ref 135–145)

## 2024-08-13 LAB — GLUCOSE, CAPILLARY
Glucose-Capillary: 211 mg/dL — ABNORMAL HIGH (ref 70–99)
Glucose-Capillary: 195 mg/dL — ABNORMAL HIGH (ref 70–99)
Glucose-Capillary: 294 mg/dL — ABNORMAL HIGH (ref 70–99)
Glucose-Capillary: 250 mg/dL — ABNORMAL HIGH (ref 70–99)

## 2024-08-13 LAB — HEPARIN LEVEL (UNFRACTIONATED)
Heparin Unfractionated: 0.26 [IU]/mL — ABNORMAL LOW (ref 0.30–0.70)
Heparin Unfractionated: 0.16 [IU]/mL — ABNORMAL LOW (ref 0.30–0.70)

## 2024-08-13 MED ORDER — HEPARIN BOLUS VIA INFUSION
1000.0000 [IU] | Freq: Once | INTRAVENOUS | Status: AC
  Administered 2024-08-13: 1000 [IU] via INTRAVENOUS
  Filled 2024-08-13: qty 1000

## 2024-08-13 MED ORDER — HEPARIN BOLUS VIA INFUSION
2050.0000 [IU] | Freq: Once | INTRAVENOUS | Status: AC
  Administered 2024-08-13: 2050 [IU] via INTRAVENOUS
  Filled 2024-08-13: qty 2050

## 2024-08-13 MED ORDER — INSULIN GLARGINE 100 UNIT/ML ~~LOC~~ SOLN
20.0000 [IU] | Freq: Every day | SUBCUTANEOUS | Status: DC
  Administered 2024-08-13 – 2024-08-15 (×3): 20 [IU] via SUBCUTANEOUS
  Filled 2024-08-13 (×4): qty 0.2

## 2024-08-13 NOTE — Progress Notes (Signed)
 PHARMACY - ANTICOAGULATION CONSULT NOTE  Pharmacy Consult for UFH Indication: atrial fibrillation  Allergies[1]  Patient Measurements: Height: 5' 11 (180.3 cm) Weight: 68 kg (149 lb 14.6 oz) IBW/kg (Calculated) : 75.3 HEPARIN  DW (KG): 68  Labs: Recent Labs    08/10/24 2119 08/11/24 0845 08/11/24 0845 08/11/24 1518 08/12/24 0022 08/12/24 0843 08/12/24 1959 08/13/24 0706  HGB 15.1  --   --   --   --  12.6*  --  12.1*  HCT 42.8  --   --   --   --  36.3*  --  35.4*  PLT 308  --   --   --   --  265  --  296  APTT  --  40*  --  43*  --   --   --   --   LABPROT 14.8  --   --   --   --   --   --   --   INR 1.1  --   --   --   --   --   --   --   HEPARINUNFRC  --  0.19*   < > 0.12*   < > <0.10* <0.10* 0.26*  CREATININE 0.84  --   --   --   --   --   --  0.83   < > = values in this interval not displayed.    Estimated Creatinine Clearance: 83.1 mL/min (by C-G formula based on SCr of 0.83 mg/dL).   Medical History: Past Medical History:  Diagnosis Date   Adenomatous colon polyp    Allergic rhinitis    At risk for sleep apnea    STOP-BANG= 5   SENT TO PCP 03-14-2014   Bladder cancer (HCC)    CAD (coronary artery disease)    a. 07/2014 low risk MV; b. 07/2019 Cor CTA (FFR): LM 25-49 (nl), LAD mild prox/mid plaque (nl), D1 25-49p(nl w/ abnl FFR of 0.73 in inf branch), LCX/OM1 mild prox/mid plaque (nl), RCA nondominant, minimal Ca2+ plaque (nl), RPDA (mildly abnl @ 0.79)-->Med Rx..   Cataract    surgically removed bilateral   Condyloma acuminatum of penis    Diabetic neuropathy (HCC)    Diastolic dysfunction    a. 05/2019 Echo: EF 60-65%, no rwam, mod LVH, impaired relaxation, nl RV size/fxn, trace MR, triv TR.   Diverticulosis    GERD (gastroesophageal reflux disease)    History of bladder cancer    s/p  turbt  2013/   transitional cell carcinoma--    History of condyloma acuminatum    PERINEAL AREA  W/ RECURRENCY   History of gout    Hyperlipidemia    Hypertension     Hypoxemia 03/09/2024   Lower urinary tract symptoms (LUTS)    PAF (paroxysmal atrial fibrillation) (HCC)    a. 06/2019 Event monitor: PAF <1% burden. Longest 3 mins 36 secs.   Productive cough    PSVT (paroxysmal supraventricular tachycardia)    a. 06/2019 Event monitor: 112 episodes of SVT, longest 21 secs.   PVD (peripheral vascular disease) with claudication    a. 03/2014 LE art duplex: long segment occlusion of mid to distal R SFA; b. 03/2020 ABI: nl left and mildly improved R ABI->med rx.   Renal artery stenosis    Renal artery stenosis    a. 12/2020 Renal art duplex: RRA 1-59%, LRA >60%.   Smokers' cough (HCC)    Type 2 diabetes mellitus with insulin  therapy (HCC) 1992  monitor by  dr ellsion   Wears dentures    upper   Assessment: 69 y/o M admitted with LE cellulitis and possible osteomyelitis on apixaban  PTA for AF with last dose yesterday. Pharmacy consulted to initiate heparin  bridge therapy.  Update 1/1 after baseline labs -- baseline aPTT and HL both correlating. Will titrate by heparin  level.  Goal of Therapy:  Heparin  level 0.3-0.7 units/ml aPTT 66-102 seconds Monitor platelets by anticoagulation protocol: Yes  1/1 @ 1518: aPTT 43, HL 0.12 = SUBtherapeutic at 1050 un/hr 1/2 @ 0022: HL = < 0.1, SUBtherapeutic 1/2 @ 0843: HL = < 0.1, SUBtherapeutic 1/2 @ 1959: HL = < 0.1, SUBtherapeutic 1/3 @ 0706: HL = 0.26, SUBtherapeutic   Plan:  --Heparin  level is subtherapeutic --Heparin  1000 unit IV bolus and increase heparin  infusion rate to 2000 units/hr --Re-check HL 6 hours from rate change --Daily CBC per protocol while on IV heparin   Ananda Caya B Joel Mericle 08/13/2024 8:28 AM    [1]  Allergies Allergen Reactions   Cefepime  Hives    Had allergic reaction to one of these 3 agents, unclear which one   Metoprolol  Hives    Had allergic reaction to one of these 3 agents, unclear which one  *Per RN, highly likely this is the cause of allergic rxn   Myrbetriq  [Mirabegron ]  Hives    Had allergic reaction to one of these 3 agents, unclear which one

## 2024-08-13 NOTE — Progress Notes (Addendum)
 " Progress Note   Patient: [name] FMW:[id number] DOB: 05-Sep-1955 DOA: 08/10/2024     3 DOS: the patient was seen and examined on 08/13/2024   Brief hospital course: 69 y.o. male with medical history significant for PAD, osteomyelitis s/p toe amputation, DM, HTN, bladder cancer, paroxysmal atrial fibrillation on Eliquis , , depression being admitted for cellulitis left foot with possible osteomyelitis.  He was admitted for the same in August 2025, undergoing amputation of the left great toe at that time, and he was treated with meropenem -> Cipro  to complete 4 weeks after OR cultures grew Pseudomonas, Enterobacter and Proteus.  Today he presented by EMS with pain and swelling of the left foot with redness as well as dizziness and weakness, which caused him to fall hitting his head on 1 occasion but without loss of consciousness.  He reports not having electricity in the house and cannot power his cell phone.  He decided to come into the ED to get checked out. In the ED, temp of 99.2 with otherwise normal vitals.  Labs notable for WBC 19,000 but otherwise unremarkable. EKG with no acute concerning findings Venous ultrasound negative for DVT Foot x-ray showed the following: IMPRESSION: 1. Soft tissue swelling along the plantar surface of the forefoot at the amputation site with subcutaneous gas. 2. Mild cortical irregularity along the medial margin of the remaining first metatarsal head, which could indicate osteomyelitis. 3. 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   The ED provider reached out to podiatrist, Dr.  Lamount who recommended Zosyn  and vancomycin  and get MRI.  No mention of OR  MRI of the foot did not show any convincing evidence of acute osteomyelitis at this time.  1/2.  Patient was contemplating leaving the hospital.  I told him we need to get lab draws if were on heparin  drip. 1/3.  Sugars finally up we will restart Lantus  insulin .   Patient states that he does not have transportation at home or power.  He is interested in returning home.  Assessment and Plan: * Cellulitis of left foot Also with numerous foot wounds.  Patient started on aggressive antibiotics with vancomycin  and Zosyn .  Appreciate podiatry and vascular surgery consultations.  Angiogram on Monday.  MRI does not show any signs of osteomyelitis.  Podiatry following.  Wound culture growing moderate Staph aureus with sensitivities pending.  Wound culture also growing moderate Morganella with a resistance to all antibiotics except for Zosyn , meropenem  and ertapenem.  Uncontrolled type 2 diabetes mellitus with hypoglycemia, with long-term current use of insulin  [facility]) Patient had a sugar of 38 on 08/11/2024.  Sugars starting to elevate today we will restart Lantus  20 units daily continue sliding scale.  PAD (peripheral artery disease) Holding Eliquis  for angiogram on Monday.  On heparin  drip.  PAF (paroxysmal atrial fibrillation) (HCC) Continue heparin  drip and amiodarone   CAD (coronary artery disease) Continue heparin , atorvastatin   Essential hypertension Continue irbesartan , spironolactone  and Lasix   Housing instability TOC following  Tobacco abuse Nicotine patch.  Patient must stop smoking.        Subjective: Patient feeling okay.  Talking about his lack of transportation and no car and cannot get to the appointments and no power in his house.  Patient states he feels okay.  Physical Exam: Vitals:   08/12/24 1749 08/12/24 2103 08/13/24 0453 08/13/24 0756  BP: (!) 160/71 (!) 150/76 (!) 161/74 (!) 159/71  Pulse: 72 72 68 66  Resp:  17 18 18 17   Temp:  98.4 F (36.9 C) 97.9 F (36.6 C) 98.1 F (36.7 C)  TempSrc:      SpO2: 98% 97% 95% 95%  Weight:      Height:       Physical Exam HENT:     Head: Normocephalic.  Eyes:     General: Lids are normal.  Cardiovascular:     Rate and Rhythm: Normal rate and regular rhythm.     Heart sounds:  Normal heart sounds, S1 normal and S2 normal.  Pulmonary:     Breath sounds: No decreased breath sounds, wheezing, rhonchi or rales.  Abdominal:     Palpations: Abdomen is soft.     Tenderness: There is no abdominal tenderness.  Musculoskeletal:     Right lower leg: No swelling.     Left lower leg: No swelling.  Skin:    General: Skin is warm.     Comments: Left foot wrapped.  Neurological:     Mental Status: He is alert and oriented to person, place, and time.     Data Reviewed: Staph aureus with sensitivities pending on wound culture Morganella resistant to all antibiotics except for Zosyn  meropenem  and ertapenem. Creatinine 0.83 electrolytes normal range glucose up to 232, white blood cell count 11.0, hemoglobin 12.1, platelet count 296  Disposition: Status is: Inpatient Remains inpatient appropriate because: Unfortunately I am not left with an oral antibiotic for the Morganella.  Will be here till Tuesday anyway with angiogram set up for Monday.  Planned Discharge Destination: Home with Home Health    Time spent: 28 minutes  Author: Charlie Patterson, MD 08/13/2024 12:46 PM  For on call review [email or web address].  "
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
## 2024-08-13 NOTE — Progress Notes (Signed)
 PHARMACY - ANTICOAGULATION CONSULT NOTE  Pharmacy Consult for UFH Indication: atrial fibrillation  Allergies[1]  Patient Measurements: Height: 5' 11 (180.3 cm) Weight: 68 kg (149 lb 14.6 oz) IBW/kg (Calculated) : 75.3 HEPARIN  DW (KG): 68  Labs: Recent Labs    08/10/24 2119 08/11/24 0845 08/11/24 0845 08/11/24 1518 08/12/24 0022 08/12/24 0843 08/12/24 1959 08/13/24 0706 08/13/24 1628  HGB 15.1  --   --   --   --  12.6*  --  12.1*  --   HCT 42.8  --   --   --   --  36.3*  --  35.4*  --   PLT 308  --   --   --   --  265  --  296  --   APTT  --  40*  --  43*  --   --   --   --   --   LABPROT 14.8  --   --   --   --   --   --   --   --   INR 1.1  --   --   --   --   --   --   --   --   HEPARINUNFRC  --  0.19*   < > 0.12*   < > <0.10* <0.10* 0.26* 0.16*  CREATININE 0.84  --   --   --   --   --   --  0.83  --    < > = values in this interval not displayed.    Estimated Creatinine Clearance: 83.1 mL/min (by C-G formula based on SCr of 0.83 mg/dL).   Medical History: Past Medical History:  Diagnosis Date   Adenomatous colon polyp    Allergic rhinitis    At risk for sleep apnea    STOP-BANG= 5   SENT TO PCP 03-14-2014   Bladder cancer (HCC)    CAD (coronary artery disease)    a. 07/2014 low risk MV; b. 07/2019 Cor CTA (FFR): LM 25-49 (nl), LAD mild prox/mid plaque (nl), D1 25-49p(nl w/ abnl FFR of 0.73 in inf branch), LCX/OM1 mild prox/mid plaque (nl), RCA nondominant, minimal Ca2+ plaque (nl), RPDA (mildly abnl @ 0.79)-->Med Rx..   Cataract    surgically removed bilateral   Condyloma acuminatum of penis    Diabetic neuropathy (HCC)    Diastolic dysfunction    a. 05/2019 Echo: EF 60-65%, no rwam, mod LVH, impaired relaxation, nl RV size/fxn, trace MR, triv TR.   Diverticulosis    GERD (gastroesophageal reflux disease)    History of bladder cancer    s/p  turbt  2013/   transitional cell carcinoma--    History of condyloma acuminatum    PERINEAL AREA  W/ RECURRENCY    History of gout    Hyperlipidemia    Hypertension    Hypoxemia 03/09/2024   Lower urinary tract symptoms (LUTS)    PAF (paroxysmal atrial fibrillation) (HCC)    a. 06/2019 Event monitor: PAF <1% burden. Longest 3 mins 36 secs.   Productive cough    PSVT (paroxysmal supraventricular tachycardia)    a. 06/2019 Event monitor: 112 episodes of SVT, longest 21 secs.   PVD (peripheral vascular disease) with claudication    a. 03/2014 LE art duplex: long segment occlusion of mid to distal R SFA; b. 03/2020 ABI: nl left and mildly improved R ABI->med rx.   Renal artery stenosis    Renal artery stenosis    a. 12/2020  Renal art duplex: RRA 1-59%, LRA >60%.   Smokers' cough (HCC)    Type 2 diabetes mellitus with insulin  therapy (HCC) 1992   monitor by  dr ellsion   Wears dentures    upper   Assessment: 69 y/o M admitted with LE cellulitis and possible osteomyelitis on apixaban  PTA for AF with last dose yesterday. Pharmacy consulted to initiate heparin  bridge therapy.  Update 1/1 after baseline labs -- baseline aPTT and HL both correlating. Will titrate by heparin  level.  Goal of Therapy:  Heparin  level 0.3-0.7 units/ml aPTT 66-102 seconds Monitor platelets by anticoagulation protocol: Yes  1/1 @ 1518: aPTT 43, HL 0.12 = SUBtherapeutic at 1050 un/hr 1/2 @ 0022: HL = < 0.1, SUBtherapeutic 1/2 @ 0843: HL = < 0.1, SUBtherapeutic 1/2 @ 1959: HL = < 0.1, SUBtherapeutic 1/3 @ 0706: HL = 0.26, SUBtherapeutic 1/3 @ 1628: HL = 0.16, SUBtherapeutic   Plan:  --Heparin  level is subtherapeutic --Heparin  2050 unit IV bolus and increase heparin  infusion rate to 2300 units/hr --Re-check HL 6 hours from rate change --Daily CBC per protocol while on IV heparin   Will M. Lenon, PharmD, BCPS Clinical Pharmacist 08/13/2024 5:14 PM      [1]  Allergies Allergen Reactions   Cefepime  Hives    Had allergic reaction to one of these 3 agents, unclear which one   Metoprolol  Hives    Had allergic  reaction to one of these 3 agents, unclear which one  *Per RN, highly likely this is the cause of allergic rxn   Myrbetriq  [Mirabegron ] Hives    Had allergic reaction to one of these 3 agents, unclear which one
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
## 2024-08-14 DIAGNOSIS — I480 Paroxysmal atrial fibrillation: Secondary | ICD-10-CM | POA: Diagnosis not present

## 2024-08-14 DIAGNOSIS — I739 Peripheral vascular disease, unspecified: Secondary | ICD-10-CM | POA: Diagnosis not present

## 2024-08-14 DIAGNOSIS — E11649 Type 2 diabetes mellitus with hypoglycemia without coma: Secondary | ICD-10-CM | POA: Diagnosis not present

## 2024-08-14 DIAGNOSIS — L03116 Cellulitis of left lower limb: Secondary | ICD-10-CM | POA: Diagnosis not present

## 2024-08-14 LAB — GLUCOSE, CAPILLARY
Glucose-Capillary: 203 mg/dL — ABNORMAL HIGH (ref 70–99)
Glucose-Capillary: 255 mg/dL — ABNORMAL HIGH (ref 70–99)
Glucose-Capillary: 160 mg/dL — ABNORMAL HIGH (ref 70–99)
Glucose-Capillary: 182 mg/dL — ABNORMAL HIGH (ref 70–99)

## 2024-08-14 LAB — AEROBIC CULTURE W GRAM STAIN (SUPERFICIAL SPECIMEN): Gram Stain: NONE SEEN

## 2024-08-14 LAB — CBC
HCT: 35.2 % — ABNORMAL LOW (ref 39.0–52.0)
Hemoglobin: 12.2 g/dL — ABNORMAL LOW (ref 13.0–17.0)
MCH: 31.9 pg (ref 26.0–34.0)
MCHC: 34.7 g/dL (ref 30.0–36.0)
MCV: 91.9 fL (ref 80.0–100.0)
Platelets: 314 K/uL (ref 150–400)
RBC: 3.83 MIL/uL — ABNORMAL LOW (ref 4.22–5.81)
RDW: 13.8 % (ref 11.5–15.5)
WBC: 10.6 K/uL — ABNORMAL HIGH (ref 4.0–10.5)
nRBC: 0 % (ref 0.0–0.2)

## 2024-08-14 LAB — HEPARIN LEVEL (UNFRACTIONATED)
Heparin Unfractionated: 0.28 [IU]/mL — ABNORMAL LOW (ref 0.30–0.70)
Heparin Unfractionated: 0.36 [IU]/mL (ref 0.30–0.70)
Heparin Unfractionated: 0.37 [IU]/mL (ref 0.30–0.70)

## 2024-08-14 LAB — CREATININE, SERUM
Creatinine, Ser: 0.91 mg/dL (ref 0.61–1.24)
GFR, Estimated: >60 mL/min

## 2024-08-14 MED ORDER — HEPARIN BOLUS VIA INFUSION
1000.0000 [IU] | Freq: Once | INTRAVENOUS | Status: AC
  Administered 2024-08-14: 1000 [IU] via INTRAVENOUS
  Filled 2024-08-14: qty 1000

## 2024-08-14 MED ORDER — GUAIFENESIN-DM 100-10 MG/5ML PO SYRP: 10.0000 mL | ORAL_SOLUTION | ORAL | Status: DC | PRN

## 2024-08-14 MED ORDER — GUAIFENESIN ER 600 MG PO TB12
600.0000 mg | ORAL_TABLET | Freq: Two times a day (BID) | ORAL | Status: DC
  Administered 2024-08-14 – 2024-08-22 (×15): 600 mg via ORAL
  Filled 2024-08-14 (×15): qty 1

## 2024-08-14 NOTE — Progress Notes (Signed)
 " Progress Note   Patient: [name] FMW:[id number] DOB: 11-08-1955 DOA: 08/10/2024     4 DOS: the patient was seen and examined on 08/14/2024   Brief hospital course: 69 y.o. male with medical history significant for PAD, osteomyelitis s/p toe amputation, DM, HTN, bladder cancer, paroxysmal atrial fibrillation on Eliquis , , depression being admitted for cellulitis left foot with possible osteomyelitis.  He was admitted for the same in August 2025, undergoing amputation of the left great toe at that time, and he was treated with meropenem -> Cipro  to complete 4 weeks after OR cultures grew Pseudomonas, Enterobacter and Proteus.  Today he presented by EMS with pain and swelling of the left foot with redness as well as dizziness and weakness, which caused him to fall hitting his head on 1 occasion but without loss of consciousness.  He reports not having electricity in the house and cannot power his cell phone.  He decided to come into the ED to get checked out. In the ED, temp of 99.2 with otherwise normal vitals.  Labs notable for WBC 19,000 but otherwise unremarkable. EKG with no acute concerning findings Venous ultrasound negative for DVT Foot x-ray showed the following: IMPRESSION: 1. Soft tissue swelling along the plantar surface of the forefoot at the amputation site with subcutaneous gas. 2. Mild cortical irregularity along the medial margin of the remaining first metatarsal head, which could indicate osteomyelitis. 3. 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   The ED provider reached out to podiatrist, Dr.  Lamount who recommended Zosyn  and vancomycin  and get MRI.  No mention of OR  MRI of the foot did not show any convincing evidence of acute osteomyelitis at this time.  1/2.  Patient was contemplating leaving the hospital.  I told him we need to get lab draws if were on heparin  drip. 1/3.  Sugars finally up we will restart Lantus  insulin .   Patient states that he does not have transportation at home or power.  He is interested in returning home.  Morganella resistant to all oral antibiotics. 1/4.  Staph aureus in the wound sensitive to oxacillin, Enterococcus in the wound sensitive to ampicillin .  Zosyn  would cover all of these organisms.  Discontinue vancomycin .  For angiogram tomorrow.  Assessment and Plan: * Cellulitis of left foot Also with numerous foot wounds.  Patient started on aggressive antibiotics with vancomycin  and Zosyn .  Appreciate podiatry and vascular surgery consultations.  Angiogram on Monday.  MRI does not show any signs of osteomyelitis.  Podiatry following.  Wound culture growing moderate Staph aureus, Morganella and Enterococcus.  Case discussed with pharmacist and Zosyn  would cover all 3 organisms.  Morganella does show resistance and we do not have an oral choice.  Discontinue vancomycin .  Uncontrolled type 2 diabetes mellitus with hypoglycemia, with long-term current use of insulin  [facility]) Patient had a sugar of 38 on 08/11/2024.  Restarted lower dose of Lantus  yesterday.  Will continue low dose since patient will likely be n.p.o. most of the day tomorrow.  PAD (peripheral artery disease) Holding Eliquis  for angiogram on Monday.  On heparin  drip.  PAF (paroxysmal atrial fibrillation) (HCC) Continue heparin  drip and amiodarone   CAD (coronary artery disease) Continue heparin , atorvastatin   Essential hypertension Continue irbesartan , spironolactone  and Lasix   Housing instability TOC following  Tobacco abuse Nicotine patch.  Patient must stop smoking.        Subjective: Patient feels well.  Interestingly no improvement soon  as possible.  Angiogram for tomorrow.  I told him that we The resistant bacteria that is only sensitive to IV antibiotics.  Physical Exam: Vitals:   08/13/24 1513 08/13/24 1941 08/14/24 0317 08/14/24 0748  BP: (!) 150/66 (!) 146/113 (!) 158/74 (!) 144/55  Pulse: 69 77 67 60   Resp: 18 20 17 17   Temp: 98.3 F (36.8 C) 98.2 F (36.8 C) 99.2 F (37.3 C) 98.8 F (37.1 C)  TempSrc:      SpO2: 99% 99% 93% 94%  Weight:      Height:       Physical Exam HENT:     Head: Normocephalic.  Eyes:     General: Lids are normal.  Cardiovascular:     Rate and Rhythm: Normal rate and regular rhythm.     Heart sounds: Normal heart sounds, S1 normal and S2 normal.  Pulmonary:     Breath sounds: No decreased breath sounds, wheezing, rhonchi or rales.  Abdominal:     Palpations: Abdomen is soft.     Tenderness: There is no abdominal tenderness.  Musculoskeletal:     Right lower leg: No swelling.     Left lower leg: No swelling.  Skin:    General: Skin is warm.     Comments: Left foot wrapped.  Neurological:     Mental Status: He is alert and oriented to person, place, and time.     Data Reviewed: Creatinine 0.91, Hemoglobin 12.2, white blood cell count 10.6  Disposition: Status is: Inpatient Remains inpatient appropriate because: We do not have a good oral choice to treat Morganella.  Continue IV Zosyn   Planned Discharge Destination: Home with Home Health    Time spent: 28 minutes  Author: Charlie Patterson, MD 08/14/2024 12:14 PM  For on call review [email or web address].  "

## 2024-08-14 NOTE — Progress Notes (Signed)
" ° ° °  Subjective  -   No acute overnight events Worried about dispo plans and housing   Physical Exam:  Left foot dressing clean and dry       Assessment/Plan:    PAD with ulcers, left foot:  discussed proceeding with angiogram tomorrow to better evaluate his blood flow and make sure it is optimized.  Continue IV abx and statin.  Npo after midnight  Wells [name] 08/14/2024 9:44 AM --  Vitals:   08/14/24 0317 08/14/24 0748  BP: (!) 158/74 (!) 144/55  Pulse: 67 60  Resp: 17 17  Temp: 99.2 F (37.3 C) 98.8 F (37.1 C)  SpO2: 93% 94%    Intake/Output Summary (Last 24 hours) at 08/14/2024 0944 Last data filed at 08/14/2024 9357 Gross per 24 hour  Intake 2502.41 ml  Output 3900 ml  Net -1397.59 ml     Laboratory CBC    Component Value Date/Time   WBC 10.6 (H) 08/14/2024 0538   HGB 12.2 (L) 08/14/2024 0538   HCT 35.2 (L) 08/14/2024 0538   PLT 314 08/14/2024 0538    BMET    Component Value Date/Time   NA 135 08/13/2024 0706   NA 138 06/16/2019 1144   K 3.7 08/13/2024 0706   CL 98 08/13/2024 0706   CO2 29 08/13/2024 0706   GLUCOSE 232 (H) 08/13/2024 0706   BUN 15 08/13/2024 0706   BUN 15 06/16/2019 1144   CREATININE 0.91 08/14/2024 0538   CALCIUM  8.6 (L) 08/13/2024 0706   GFRNONAA >60 08/14/2024 0538   GFRAA >60 02/29/2020 0937    COAG Lab Results  Component Value Date   INR 1.1 08/10/2024   INR 1.0 03/27/2024   INR 1.1 06/04/2019   No results found for: PTT  Antibiotics Anti-infectives (From admission, onward)    Start     Dose/Rate Route Frequency Ordered Stop   08/11/24 1200  vancomycin  (VANCOCIN ) IVPB 1000 mg/200 mL premix  Status:  Discontinued        1,000 mg 200 mL/hr over 60 Minutes Intravenous Every 12 hours 08/11/24 0209 08/11/24 0834   08/11/24 1200  vancomycin  (VANCOREADY) IVPB 750 mg/150 mL        750 mg 150 mL/hr over 60 Minutes Intravenous Every 12 hours 08/11/24 0834     08/11/24 0400  piperacillin -tazobactam (ZOSYN ) IVPB  3.375 g        3.375 g 12.5 mL/hr over 240 Minutes Intravenous Every 8 hours 08/11/24 0206     08/11/24 0300  vancomycin  (VANCOCIN ) IVPB 1000 mg/200 mL premix  Status:  Discontinued        1,000 mg 200 mL/hr over 60 Minutes Intravenous Every 12 hours 08/11/24 0208 08/11/24 0209   08/10/24 2230  piperacillin -tazobactam (ZOSYN ) IVPB 3.375 g        3.375 g 100 mL/hr over 30 Minutes Intravenous  Once 08/10/24 2221 08/10/24 2303   08/10/24 2200  vancomycin  (VANCOREADY) IVPB 1500 mg/300 mL        1,500 mg 150 mL/hr over 120 Minutes Intravenous  Once 08/10/24 2110 08/11/24 0335        V. Malvina Serene CLORE, M.D., [facility]  Pager:  [phone]  "

## 2024-08-14 NOTE — Progress Notes (Signed)
 PHARMACY - ANTICOAGULATION CONSULT NOTE  Pharmacy Consult for UFH Indication: atrial fibrillation  Allergies[1]  Patient Measurements: Height: 5' 11 (180.3 cm) Weight: 68 kg (149 lb 14.6 oz) IBW/kg (Calculated) : 75.3 HEPARIN  DW (KG): 68  Labs: Recent Labs    08/11/24 1518 08/12/24 0022 08/12/24 0843 08/12/24 1959 08/13/24 0706 08/13/24 1628 08/14/24 0015 08/14/24 0538 08/14/24 1307  HGB  --    < > 12.6*  --  12.1*  --   --  12.2*  --   HCT  --   --  36.3*  --  35.4*  --   --  35.2*  --   PLT  --   --  265  --  296  --   --  314  --   APTT 43*  --   --   --   --   --   --   --   --   HEPARINUNFRC 0.12*   < > <0.10*   < > 0.26*   < > 0.37 0.28* 0.36  CREATININE  --   --   --   --  0.83  --   --  0.91  --    < > = values in this interval not displayed.    Estimated Creatinine Clearance: 75.8 mL/min (by C-G formula based on SCr of 0.91 mg/dL).   Medical History: Past Medical History:  Diagnosis Date   Adenomatous colon polyp    Allergic rhinitis    At risk for sleep apnea    STOP-BANG= 5   SENT TO PCP 03-14-2014   Bladder cancer (HCC)    CAD (coronary artery disease)    a. 07/2014 low risk MV; b. 07/2019 Cor CTA (FFR): LM 25-49 (nl), LAD mild prox/mid plaque (nl), D1 25-49p(nl w/ abnl FFR of 0.73 in inf branch), LCX/OM1 mild prox/mid plaque (nl), RCA nondominant, minimal Ca2+ plaque (nl), RPDA (mildly abnl @ 0.79)-->Med Rx..   Cataract    surgically removed bilateral   Condyloma acuminatum of penis    Diabetic neuropathy (HCC)    Diastolic dysfunction    a. 05/2019 Echo: EF 60-65%, no rwam, mod LVH, impaired relaxation, nl RV size/fxn, trace MR, triv TR.   Diverticulosis    GERD (gastroesophageal reflux disease)    History of bladder cancer    s/p  turbt  2013/   transitional cell carcinoma--    History of condyloma acuminatum    PERINEAL AREA  W/ RECURRENCY   History of gout    Hyperlipidemia    Hypertension    Hypoxemia 03/09/2024   Lower urinary tract  symptoms (LUTS)    PAF (paroxysmal atrial fibrillation) (HCC)    a. 06/2019 Event monitor: PAF <1% burden. Longest 3 mins 36 secs.   Productive cough    PSVT (paroxysmal supraventricular tachycardia)    a. 06/2019 Event monitor: 112 episodes of SVT, longest 21 secs.   PVD (peripheral vascular disease) with claudication    a. 03/2014 LE art duplex: long segment occlusion of mid to distal R SFA; b. 03/2020 ABI: nl left and mildly improved R ABI->med rx.   Renal artery stenosis    Renal artery stenosis    a. 12/2020 Renal art duplex: RRA 1-59%, LRA >60%.   Smokers' cough (HCC)    Type 2 diabetes mellitus with insulin  therapy (HCC) 1992   monitor by  dr ellsion   Wears dentures    upper   Assessment: 69 y/o M admitted with LE cellulitis and  possible osteomyelitis on apixaban  PTA for AF with last dose yesterday. Pharmacy consulted to initiate heparin  bridge therapy.  Update 1/1 after baseline labs -- baseline aPTT and HL both correlating. Will titrate by heparin  level.  Goal of Therapy:  Heparin  level 0.3-0.7 units/ml aPTT 66-102 seconds Monitor platelets by anticoagulation protocol: Yes  1/1 @ 1518: aPTT 43, HL 0.12 = SUBtherapeutic at 1050 un/hr 1/2 @ 0022: HL = < 0.1, SUBtherapeutic 1/2 @ 0843: HL = < 0.1, SUBtherapeutic 1/2 @ 1959: HL = < 0.1, SUBtherapeutic 1/3 @ 0706: HL = 0.26, SUBtherapeutic 1/3 @ 1628: HL = 0.16, SUBtherapeutic 1/3 @ 0015: HL = 0.37, therapeutic X 1  1/4 @ 1307: HL = 0.36, therapeutic x 2   Plan:  - 1/4 @ 1307: HL = 0.36, therapeutic x 2 - Will continue current Heparin  gtt rate  - Will switch to daily HL monitoring since level is therapeutic x 2 - Will monitor CBC and HL daily with AM labs    Ransom Blanch PGY-1 Pharmacy Resident  [place] [facility]  08/14/2024 2:11 PM         [1]  Allergies Allergen Reactions   Cefepime  Hives    Had allergic reaction to one of these 3 agents, unclear which one   Metoprolol  Hives    Had allergic reaction to  one of these 3 agents, unclear which one  *Per RN, highly likely this is the cause of allergic rxn   Myrbetriq  [Mirabegron ] Hives    Had allergic reaction to one of these 3 agents, unclear which one

## 2024-08-14 NOTE — Plan of Care (Signed)

## 2024-08-14 NOTE — Plan of Care (Signed)
   Problem: Nutrition: Goal: Adequate nutrition will be maintained Outcome: Progressing   Problem: Pain Managment: Goal: General experience of comfort will improve and/or be controlled Outcome: Progressing   Problem: Safety: Goal: Ability to remain free from injury will improve Outcome: Progressing

## 2024-08-14 NOTE — Progress Notes (Signed)
 PHARMACY - ANTICOAGULATION CONSULT NOTE  Pharmacy Consult for UFH Indication: atrial fibrillation  Allergies[1]  Patient Measurements: Height: 5' 11 (180.3 cm) Weight: 68 kg (149 lb 14.6 oz) IBW/kg (Calculated) : 75.3 HEPARIN  DW (KG): 68  Labs: Recent Labs    08/11/24 0845 08/11/24 1518 08/12/24 0022 08/12/24 0843 08/12/24 1959 08/13/24 0706 08/13/24 1628 08/14/24 0015  HGB  --   --   --  12.6*  --  12.1*  --   --   HCT  --   --   --  36.3*  --  35.4*  --   --   PLT  --   --   --  265  --  296  --   --   APTT 40* 43*  --   --   --   --   --   --   HEPARINUNFRC 0.19* 0.12*   < > <0.10*   < > 0.26* 0.16* 0.37  CREATININE  --   --   --   --   --  0.83  --   --    < > = values in this interval not displayed.    Estimated Creatinine Clearance: 83.1 mL/min (by C-G formula based on SCr of 0.83 mg/dL).   Medical History: Past Medical History:  Diagnosis Date   Adenomatous colon polyp    Allergic rhinitis    At risk for sleep apnea    STOP-BANG= 5   SENT TO PCP 03-14-2014   Bladder cancer (HCC)    CAD (coronary artery disease)    a. 07/2014 low risk MV; b. 07/2019 Cor CTA (FFR): LM 25-49 (nl), LAD mild prox/mid plaque (nl), D1 25-49p(nl w/ abnl FFR of 0.73 in inf branch), LCX/OM1 mild prox/mid plaque (nl), RCA nondominant, minimal Ca2+ plaque (nl), RPDA (mildly abnl @ 0.79)-->Med Rx..   Cataract    surgically removed bilateral   Condyloma acuminatum of penis    Diabetic neuropathy (HCC)    Diastolic dysfunction    a. 05/2019 Echo: EF 60-65%, no rwam, mod LVH, impaired relaxation, nl RV size/fxn, trace MR, triv TR.   Diverticulosis    GERD (gastroesophageal reflux disease)    History of bladder cancer    s/p  turbt  2013/   transitional cell carcinoma--    History of condyloma acuminatum    PERINEAL AREA  W/ RECURRENCY   History of gout    Hyperlipidemia    Hypertension    Hypoxemia 03/09/2024   Lower urinary tract symptoms (LUTS)    PAF (paroxysmal atrial  fibrillation) (HCC)    a. 06/2019 Event monitor: PAF <1% burden. Longest 3 mins 36 secs.   Productive cough    PSVT (paroxysmal supraventricular tachycardia)    a. 06/2019 Event monitor: 112 episodes of SVT, longest 21 secs.   PVD (peripheral vascular disease) with claudication    a. 03/2014 LE art duplex: long segment occlusion of mid to distal R SFA; b. 03/2020 ABI: nl left and mildly improved R ABI->med rx.   Renal artery stenosis    Renal artery stenosis    a. 12/2020 Renal art duplex: RRA 1-59%, LRA >60%.   Smokers' cough (HCC)    Type 2 diabetes mellitus with insulin  therapy (HCC) 1992   monitor by  dr ellsion   Wears dentures    upper   Assessment: 69 y/o M admitted with LE cellulitis and possible osteomyelitis on apixaban  PTA for AF with last dose yesterday. Pharmacy consulted to initiate heparin   bridge therapy.  Update 1/1 after baseline labs -- baseline aPTT and HL both correlating. Will titrate by heparin  level.  Goal of Therapy:  Heparin  level 0.3-0.7 units/ml aPTT 66-102 seconds Monitor platelets by anticoagulation protocol: Yes  1/1 @ 1518: aPTT 43, HL 0.12 = SUBtherapeutic at 1050 un/hr 1/2 @ 0022: HL = < 0.1, SUBtherapeutic 1/2 @ 0843: HL = < 0.1, SUBtherapeutic 1/2 @ 1959: HL = < 0.1, SUBtherapeutic 1/3 @ 0706: HL = 0.26, SUBtherapeutic 1/3 @ 1628: HL = 0.16, SUBtherapeutic 1/3 @ 0015: HL = 0.37, therapeutic X 1    Plan:  1/3:  HL @ 0015 = 0.37, therapeutic X 1 - will continue pt on current rate and recheck HL in 6 hrs --Daily CBC per protocol while on IV heparin   Taylon Coole D Clinical Pharmacist 08/14/2024 12:53 AM       [1]  Allergies Allergen Reactions   Cefepime  Hives    Had allergic reaction to one of these 3 agents, unclear which one   Metoprolol  Hives    Had allergic reaction to one of these 3 agents, unclear which one  *Per RN, highly likely this is the cause of allergic rxn   Myrbetriq  [Mirabegron ] Hives    Had allergic reaction to one  of these 3 agents, unclear which one

## 2024-08-15 ENCOUNTER — Telehealth: Payer: Self-pay

## 2024-08-15 ENCOUNTER — Encounter: Payer: Self-pay | Admitting: Vascular Surgery

## 2024-08-15 ENCOUNTER — Encounter: Admission: EM | Disposition: A | Payer: Self-pay | Source: Home / Self Care | Attending: Internal Medicine

## 2024-08-15 DIAGNOSIS — I708 Atherosclerosis of other arteries: Secondary | ICD-10-CM

## 2024-08-15 DIAGNOSIS — C679 Malignant neoplasm of bladder, unspecified: Secondary | ICD-10-CM

## 2024-08-15 DIAGNOSIS — T82868A Thrombosis of vascular prosthetic devices, implants and grafts, initial encounter: Secondary | ICD-10-CM | POA: Diagnosis not present

## 2024-08-15 DIAGNOSIS — I70245 Atherosclerosis of native arteries of left leg with ulceration of other part of foot: Secondary | ICD-10-CM

## 2024-08-15 DIAGNOSIS — I739 Peripheral vascular disease, unspecified: Secondary | ICD-10-CM | POA: Diagnosis not present

## 2024-08-15 DIAGNOSIS — Z9889 Other specified postprocedural states: Secondary | ICD-10-CM | POA: Diagnosis not present

## 2024-08-15 DIAGNOSIS — I701 Atherosclerosis of renal artery: Secondary | ICD-10-CM | POA: Diagnosis not present

## 2024-08-15 DIAGNOSIS — L03116 Cellulitis of left lower limb: Secondary | ICD-10-CM | POA: Diagnosis not present

## 2024-08-15 DIAGNOSIS — L97529 Non-pressure chronic ulcer of other part of left foot with unspecified severity: Secondary | ICD-10-CM

## 2024-08-15 DIAGNOSIS — T82858A Stenosis of vascular prosthetic devices, implants and grafts, initial encounter: Secondary | ICD-10-CM | POA: Diagnosis not present

## 2024-08-15 DIAGNOSIS — I480 Paroxysmal atrial fibrillation: Secondary | ICD-10-CM | POA: Diagnosis not present

## 2024-08-15 DIAGNOSIS — E11649 Type 2 diabetes mellitus with hypoglycemia without coma: Secondary | ICD-10-CM | POA: Diagnosis not present

## 2024-08-15 DIAGNOSIS — L089 Local infection of the skin and subcutaneous tissue, unspecified: Secondary | ICD-10-CM | POA: Diagnosis not present

## 2024-08-15 HISTORY — PX: ABDOMINAL AORTOGRAM W/LOWER EXTREMITY: CATH118223

## 2024-08-15 LAB — CBC
HCT: 36.2 % — ABNORMAL LOW (ref 39.0–52.0)
Hemoglobin: 12.4 g/dL — ABNORMAL LOW (ref 13.0–17.0)
MCH: 32.1 pg (ref 26.0–34.0)
MCHC: 34.3 g/dL (ref 30.0–36.0)
MCV: 93.8 fL (ref 80.0–100.0)
Platelets: 330 K/uL (ref 150–400)
RBC: 3.86 MIL/uL — ABNORMAL LOW (ref 4.22–5.81)
RDW: 13.9 % (ref 11.5–15.5)
WBC: 10.7 K/uL — ABNORMAL HIGH (ref 4.0–10.5)
nRBC: 0 % (ref 0.0–0.2)

## 2024-08-15 LAB — GLUCOSE, CAPILLARY
Glucose-Capillary: 128 mg/dL — ABNORMAL HIGH (ref 70–99)
Glucose-Capillary: 128 mg/dL — ABNORMAL HIGH (ref 70–99)
Glucose-Capillary: 109 mg/dL — ABNORMAL HIGH (ref 70–99)
Glucose-Capillary: 118 mg/dL — ABNORMAL HIGH (ref 70–99)
Glucose-Capillary: 244 mg/dL — ABNORMAL HIGH (ref 70–99)

## 2024-08-15 LAB — CULTURE, BLOOD (ROUTINE X 2)
Culture: NO GROWTH
Culture: NO GROWTH
Special Requests: ADEQUATE

## 2024-08-15 LAB — CREATININE, SERUM
Creatinine, Ser: 0.96 mg/dL (ref 0.61–1.24)
GFR, Estimated: >60 mL/min

## 2024-08-15 LAB — HEPARIN LEVEL (UNFRACTIONATED)
Heparin Unfractionated: 0.17 [IU]/mL — ABNORMAL LOW (ref 0.30–0.70)
Heparin Unfractionated: 0.37 [IU]/mL (ref 0.30–0.70)

## 2024-08-15 LAB — ABO/RH: ABO/RH(D): A POS

## 2024-08-15 MED ORDER — HEPARIN (PORCINE) IN NACL 1000-0.9 UT/500ML-% IV SOLN
INTRAVENOUS | Status: DC | PRN
  Administered 2024-08-15: 1000 mL

## 2024-08-15 MED ORDER — MIDAZOLAM HCL 2 MG/ML PO SYRP: 8.0000 mg | ORAL_SOLUTION | Freq: Once | ORAL | Status: DC | PRN

## 2024-08-15 MED ORDER — IODIXANOL 320 MG/ML IV SOLN
INTRAVENOUS | Status: DC | PRN
  Administered 2024-08-15: 40 mL

## 2024-08-15 MED ORDER — LIDOCAINE-EPINEPHRINE (PF) 1 %-1:200000 IJ SOLN
INTRAMUSCULAR | Status: DC | PRN
  Administered 2024-08-15: 10 mL

## 2024-08-15 MED ORDER — FENTANYL CITRATE (PF) 100 MCG/2ML IJ SOLN
INTRAMUSCULAR | Status: AC
  Filled 2024-08-15: qty 2

## 2024-08-15 MED ORDER — FAMOTIDINE 20 MG PO TABS: 40.0000 mg | ORAL_TABLET | Freq: Once | ORAL | Status: DC | PRN

## 2024-08-15 MED ORDER — DIPHENHYDRAMINE HCL 50 MG/ML IJ SOLN: 50.0000 mg | Freq: Once | INTRAMUSCULAR | Status: DC | PRN

## 2024-08-15 MED ORDER — HEPARIN SODIUM (PORCINE) 1000 UNIT/ML IJ SOLN
INTRAMUSCULAR | Status: AC
  Filled 2024-08-15: qty 10

## 2024-08-15 MED ORDER — HEPARIN BOLUS VIA INFUSION
2050.0000 [IU] | Freq: Once | INTRAVENOUS | Status: AC
  Administered 2024-08-15: 2050 [IU] via INTRAVENOUS
  Filled 2024-08-15: qty 2050

## 2024-08-15 MED ORDER — MIDAZOLAM HCL 5 MG/5ML IJ SOLN
INTRAMUSCULAR | Status: AC
  Filled 2024-08-15: qty 5

## 2024-08-15 MED ORDER — MIDAZOLAM HCL (PF) 2 MG/2ML IJ SOLN
INTRAMUSCULAR | Status: DC | PRN
  Administered 2024-08-15: 2 mg via INTRAVENOUS

## 2024-08-15 MED ORDER — METHYLPREDNISOLONE SODIUM SUCC 125 MG IJ SOLR: 125.0000 mg | Freq: Once | INTRAMUSCULAR | Status: DC | PRN

## 2024-08-15 MED ORDER — FENTANYL CITRATE (PF) 100 MCG/2ML IJ SOLN
INTRAMUSCULAR | Status: DC | PRN
  Administered 2024-08-15: 50 ug via INTRAVENOUS

## 2024-08-15 MED ORDER — SODIUM CHLORIDE 0.9 % IV SOLN: INTRAVENOUS | Status: DC

## 2024-08-15 NOTE — Progress Notes (Signed)
 " Progress Note    08/15/2024 8:40 AM * No surgery found *  Subjective:  [name] is a 69 yo male who was admitted on 08/10/2024 with left leg cellulitis.  He has a history of left SFA stenting in July.  He has had trouble getting back to his follow-up appointments because of his lack of transportation and living arrangements.  In the ER he was febrile to 99.2 with a white count of 19,000.   Patient has a history of coronary artery disease, he is a diabetic he is a current smoker.  He is resting and recovering in bed this morning with complaints of left lower extremity pain.  His right foot was evaluated by podiatry who feels at the least he needs a transmetatarsal amputation at this point.  They would like further blood flow evaluation prior to a TMA and therefore vascular surgery was consulted to provide a left lower extremity angiogram.   Vitals:   08/14/24 1601 08/14/24 2049  BP: (!) 150/68 (!) 150/71  Pulse: 67 69  Resp: 17 18  Temp: 98 F (36.7 C) 98.6 F (37 C)  SpO2: 97% 99%   Physical Exam: Cardiac:  RRR, normal S1 and S2.  No murmurs appreciated. Lungs: Nonlabored breathing, lungs rhonchorous throughout on auscultation.  Patient has a long history of smoking.  No rales or wheezing noted this morning. Incisions: None Extremities: All extremities are warm to touch with palpable pulses except left lower extremity with ulcerations to his left foot.  Bandages in place for left foot care via podiatry. Abdomen: Positive bowel sounds throughout, soft, nontender nondistended. Neurologic: Alert and oriented x 3, answers all questions and follows commands appropriately.  CBC    Component Value Date/Time   WBC 10.7 (H) 08/15/2024 0534   RBC 3.86 (L) 08/15/2024 0534   HGB 12.4 (L) 08/15/2024 0534   HCT 36.2 (L) 08/15/2024 0534   PLT 330 08/15/2024 0534   MCV 93.8 08/15/2024 0534   MCH 32.1 08/15/2024 0534   MCHC 34.3 08/15/2024 0534   RDW 13.9 08/15/2024 0534   LYMPHSABS 1.7  08/10/2024 2119   MONOABS 1.0 08/10/2024 2119   EOSABS 0.2 08/10/2024 2119   BASOSABS 0.1 08/10/2024 2119    BMET    Component Value Date/Time   NA 135 08/13/2024 0706   NA 138 06/16/2019 1144   K 3.7 08/13/2024 0706   CL 98 08/13/2024 0706   CO2 29 08/13/2024 0706   GLUCOSE 232 (H) 08/13/2024 0706   BUN 15 08/13/2024 0706   BUN 15 06/16/2019 1144   CREATININE 0.96 08/15/2024 0534   CALCIUM  8.6 (L) 08/13/2024 0706   GFRNONAA >60 08/15/2024 0534   GFRAA >60 02/29/2020 0937    INR    Component Value Date/Time   INR 1.1 08/10/2024 2119     Intake/Output Summary (Last 24 hours) at 08/15/2024 0840 Last data filed at 08/15/2024 9374 Gross per 24 hour  Intake 256.59 ml  Output 400 ml  Net -143.41 ml     Assessment/Plan:  69 y.o. male who was admitted on 08/10/2024 with left leg cellulitis.  He has a history of left SFA stenting from back in July at [facility].  He has not followed up since due to difficulties with transportation and living arrangements.  He has a prior history of left great toe amputation from podiatry here at [facility].  Currently he has an ulceration to the bottom of his foot just below his prior great toe amputation.  This is indicative of PAD.* No surgery found *   I discussed with the patient this morning at the bedside in detail undergoing a left lower extremity angiogram with possible intervention to help improve blood flow and heal his left foot.  I discussed in detail the procedure, benefits, risk, and complications.  Patient verbalized understanding wishes to proceed.  Patient's been n.p.o. since midnight last night.  Patient's last H&H was 12.4/36.2 and his last creatinine from 08/15/2024 is noted to be 0.96 with a GFR greater than 60.  Patient was started on heparin  infusion upon entry to the hospital and this will continue to run to preop in the procedure.  I discussed the case in detail with Dr. Selinda Gu MD and he agrees with the plan.  DVT  prophylaxis: Heparin  infusion.   [name] [facility] 08/15/2024 8:40 AM   "

## 2024-08-15 NOTE — TOC Progression Note (Addendum)
 Transition of Care [facility]) - Progression Note    Patient Details  Name: [name] MRN: [id number] Date of Birth: 1956-01-15  Transition of [facility]) CM/SW Contact  Daved JONETTA Hamilton, RN Phone Number: 08/15/2024, 11:30 AM  Clinical Narrative:     This CM placed call to [facility] to follow up on report that was filed on 08/13/24 by [facility], CM spoke with Hoy the on call APS SW.   This CM spoke with [facility] A[facility], they confirmed patient has an open case with them. Patient's Case Worker is Warren Eagles. APS confirmed that the patient's home does not have power currently even though the bill has been paid. APS is working directly with [employer] to investigate why the power is out at the patients home. APS advised that the patient is working with an attorney to try and prevent the home foreclosure. This CM provided name and phone number and requested a call back with any updated information on patient's situation/disposition on the above issues as patient is anticipated to be discharged home with home health once medically ready.                       Expected Discharge Plan and Services                                               Social Drivers of Health (SDOH) Interventions SDOH Screenings   Food Insecurity: Food Insecurity Present (08/11/2024)  Housing: High Risk (08/11/2024)  Transportation Needs: Unmet Transportation Needs (08/11/2024)  Utilities: At Risk (08/11/2024)  Alcohol Screen: Low Risk (06/10/2023)  Depression (PHQ2-9): Low Risk (07/05/2024)  Financial Resource Strain: High Risk (03/11/2024)  Physical Activity: Inactive (06/10/2023)  Social Connections: Socially Isolated (08/11/2024)  Stress: Stress Concern Present (03/11/2024)  Tobacco Use: High Risk (08/10/2024)  Health Literacy: Inadequate Health Literacy (02/17/2024)    Readmission Risk Interventions    03/30/2024    2:55 PM 03/09/2024    3:21 PM 12/30/2023    1:17 PM  Readmission Risk  Prevention Plan  Post Dischage Appt   Complete  Medication Screening   Complete  Transportation Screening Complete Complete Complete  PCP or Specialist Appt within 5-7 Days  Complete   PCP or Specialist Appt within 3-5 Days Complete    Home Care Screening  Complete   Medication Review (RN CM)  Complete   HRI or Home Care Consult Complete    Social Work Consult for Recovery Care Planning/Counseling Complete    Palliative Care Screening Complete    Medication Review Oceanographer) Referral to Pharmacy

## 2024-08-15 NOTE — Assessment & Plan Note (Addendum)
 Tissue on the bottom of the foot and second toe does not look good.  Case discussed with podiatry and will need at least a transmet amputation prior to disposition.  Podiatry will wait for better blood supply prior to this procedure.

## 2024-08-15 NOTE — Assessment & Plan Note (Signed)
 [name]

## 2024-08-15 NOTE — Progress Notes (Signed)
 PT Cancellation Note  Patient Details Name: GALAN GHEE MRN: [id number] DOB: [birth year]   Cancelled Treatment:    Reason Eval/Treat Not Completed: Patient at procedure or test/unavailable  Attempted session.  Transport in to take pt to specials.  Will return tomorrow.   Lauraine Gills 08/15/2024, 2:04 PM

## 2024-08-15 NOTE — Telephone Encounter (Signed)
 Pt was seen in the ED & has an appt scheduled for tomorrow 08/16/24.

## 2024-08-15 NOTE — H&P (View-Only) (Signed)
 " Progress Note    08/15/2024 8:40 AM * No surgery found *  Subjective:  [name] is a 69 yo male who was admitted on 08/10/2024 with left leg cellulitis.  He has a history of left SFA stenting in July.  He has had trouble getting back to his follow-up appointments because of his lack of transportation and living arrangements.  In the ER he was febrile to 99.2 with a white count of 19,000.   Patient has a history of coronary artery disease, he is a diabetic he is a current smoker.  He is resting and recovering in bed this morning with complaints of left lower extremity pain.  His right foot was evaluated by podiatry who feels at the least he needs a transmetatarsal amputation at this point.  They would like further blood flow evaluation prior to a TMA and therefore vascular surgery was consulted to provide a left lower extremity angiogram.   Vitals:   08/14/24 1601 08/14/24 2049  BP: (!) 150/68 (!) 150/71  Pulse: 67 69  Resp: 17 18  Temp: 98 F (36.7 C) 98.6 F (37 C)  SpO2: 97% 99%   Physical Exam: Cardiac:  RRR, normal S1 and S2.  No murmurs appreciated. Lungs: Nonlabored breathing, lungs rhonchorous throughout on auscultation.  Patient has a long history of smoking.  No rales or wheezing noted this morning. Incisions: None Extremities: All extremities are warm to touch with palpable pulses except left lower extremity with ulcerations to his left foot.  Bandages in place for left foot care via podiatry. Abdomen: Positive bowel sounds throughout, soft, nontender nondistended. Neurologic: Alert and oriented x 3, answers all questions and follows commands appropriately.  CBC    Component Value Date/Time   WBC 10.7 (H) 08/15/2024 0534   RBC 3.86 (L) 08/15/2024 0534   HGB 12.4 (L) 08/15/2024 0534   HCT 36.2 (L) 08/15/2024 0534   PLT 330 08/15/2024 0534   MCV 93.8 08/15/2024 0534   MCH 32.1 08/15/2024 0534   MCHC 34.3 08/15/2024 0534   RDW 13.9 08/15/2024 0534   LYMPHSABS 1.7  08/10/2024 2119   MONOABS 1.0 08/10/2024 2119   EOSABS 0.2 08/10/2024 2119   BASOSABS 0.1 08/10/2024 2119    BMET    Component Value Date/Time   NA 135 08/13/2024 0706   NA 138 06/16/2019 1144   K 3.7 08/13/2024 0706   CL 98 08/13/2024 0706   CO2 29 08/13/2024 0706   GLUCOSE 232 (H) 08/13/2024 0706   BUN 15 08/13/2024 0706   BUN 15 06/16/2019 1144   CREATININE 0.96 08/15/2024 0534   CALCIUM  8.6 (L) 08/13/2024 0706   GFRNONAA >60 08/15/2024 0534   GFRAA >60 02/29/2020 0937    INR    Component Value Date/Time   INR 1.1 08/10/2024 2119     Intake/Output Summary (Last 24 hours) at 08/15/2024 0840 Last data filed at 08/15/2024 9374 Gross per 24 hour  Intake 256.59 ml  Output 400 ml  Net -143.41 ml     Assessment/Plan:  69 y.o. male who was admitted on 08/10/2024 with left leg cellulitis.  He has a history of left SFA stenting from back in July at [facility].  He has not followed up since due to difficulties with transportation and living arrangements.  He has a prior history of left great toe amputation from podiatry here at [facility].  Currently he has an ulceration to the bottom of his foot just below his prior great toe amputation.  This is indicative of PAD.* No surgery found *   I discussed with the patient this morning at the bedside in detail undergoing a left lower extremity angiogram with possible intervention to help improve blood flow and heal his left foot.  I discussed in detail the procedure, benefits, risk, and complications.  Patient verbalized understanding wishes to proceed.  Patient's been n.p.o. since midnight last night.  Patient's last H&H was 12.4/36.2 and his last creatinine from 08/15/2024 is noted to be 0.96 with a GFR greater than 60.  Patient was started on heparin  infusion upon entry to the hospital and this will continue to run to preop in the procedure.  I discussed the case in detail with Dr. Selinda Gu MD and he agrees with the plan.  DVT  prophylaxis: Heparin  infusion.   [name] 08/15/2024 8:40 AM   "

## 2024-08-15 NOTE — Plan of Care (Signed)
  Problem: Nutrition: Goal: Adequate nutrition will be maintained Outcome: Progressing   Problem: Coping: Goal: Level of anxiety will decrease Outcome: Progressing   Problem: Elimination: Goal: Will not experience complications related to urinary retention Outcome: Progressing   Problem: Safety: Goal: Ability to remain free from injury will improve Outcome: Progressing   

## 2024-08-15 NOTE — Progress Notes (Signed)
 PHARMACY - ANTICOAGULATION CONSULT NOTE  Pharmacy Consult for UFH Indication: atrial fibrillation  Allergies[1]  Patient Measurements: Height: 5' 11 (180.3 cm) Weight: 68 kg (149 lb 14.6 oz) IBW/kg (Calculated) : 75.3 HEPARIN  DW (KG): 68  Labs: Recent Labs    08/13/24 0706 08/13/24 1628 08/14/24 0538 08/14/24 1307 08/15/24 0534  HGB 12.1*  --  12.2*  --  12.4*  HCT 35.4*  --  35.2*  --  36.2*  PLT 296  --  314  --  330  HEPARINUNFRC 0.26*   < > 0.28* 0.36 0.17*  CREATININE 0.83  --  0.91  --  0.96   < > = values in this interval not displayed.    Estimated Creatinine Clearance: 71.8 mL/min (by C-G formula based on SCr of 0.96 mg/dL).   Medical History: Past Medical History:  Diagnosis Date   Adenomatous colon polyp    Allergic rhinitis    At risk for sleep apnea    STOP-BANG= 5   SENT TO PCP 03-14-2014   Bladder cancer (HCC)    CAD (coronary artery disease)    a. 07/2014 low risk MV; b. 07/2019 Cor CTA (FFR): LM 25-49 (nl), LAD mild prox/mid plaque (nl), D1 25-49p(nl w/ abnl FFR of 0.73 in inf branch), LCX/OM1 mild prox/mid plaque (nl), RCA nondominant, minimal Ca2+ plaque (nl), RPDA (mildly abnl @ 0.79)-->Med Rx..   Cataract    surgically removed bilateral   Condyloma acuminatum of penis    Diabetic neuropathy (HCC)    Diastolic dysfunction    a. 05/2019 Echo: EF 60-65%, no rwam, mod LVH, impaired relaxation, nl RV size/fxn, trace MR, triv TR.   Diverticulosis    GERD (gastroesophageal reflux disease)    History of bladder cancer    s/p  turbt  2013/   transitional cell carcinoma--    History of condyloma acuminatum    PERINEAL AREA  W/ RECURRENCY   History of gout    Hyperlipidemia    Hypertension    Hypoxemia 03/09/2024   Lower urinary tract symptoms (LUTS)    PAF (paroxysmal atrial fibrillation) (HCC)    a. 06/2019 Event monitor: PAF <1% burden. Longest 3 mins 36 secs.   Productive cough    PSVT (paroxysmal supraventricular tachycardia)    a. 06/2019  Event monitor: 112 episodes of SVT, longest 21 secs.   PVD (peripheral vascular disease) with claudication    a. 03/2014 LE art duplex: long segment occlusion of mid to distal R SFA; b. 03/2020 ABI: nl left and mildly improved R ABI->med rx.   Renal artery stenosis    Renal artery stenosis    a. 12/2020 Renal art duplex: RRA 1-59%, LRA >60%.   Smokers' cough (HCC)    Type 2 diabetes mellitus with insulin  therapy (HCC) 1992   monitor by  dr ellsion   Wears dentures    upper   Assessment: 69 y/o M admitted with LE cellulitis and possible osteomyelitis on apixaban  PTA for AF with last dose yesterday. Pharmacy consulted to initiate heparin  bridge therapy.  Update 1/1 after baseline labs -- baseline aPTT and HL both correlating. Will titrate by heparin  level.  Goal of Therapy:  Heparin  level 0.3-0.7 units/ml aPTT 66-102 seconds Monitor platelets by anticoagulation protocol: Yes  1/1 @ 1518: aPTT 43, HL 0.12 = SUBtherapeutic at 1050 un/hr 1/2 @ 0022: HL = < 0.1, SUBtherapeutic 1/2 @ 0843: HL = < 0.1, SUBtherapeutic 1/2 @ 1959: HL = < 0.1, SUBtherapeutic 1/3 @ 0706: HL =  0.26, SUBtherapeutic 1/3 @ 1628: HL = 0.16, SUBtherapeutic 1/3 @ 0015: HL = 0.37, therapeutic X 1  1/4 @ 1307: HL = 0.36, therapeutic x 2 1/5 @ 0534: HL = 0.17, SUBtherapeutic    Plan:  1/5: HL @ 0534 = 0.17, SUBtherapeutic  - spoke with RN who said pt heparin  drip had been accidentally turned off for ~ 1 hr from 2330 - 0030.   - Will order heparin  2050 units IV X 1 bolus but continue current drip rate of 2400 units/hr - recheck HL 6 hrs after bolus  - Will monitor CBC and HL daily with AM labs    [name] - [facility] 08/15/2024 6:38 AM          [1]  Allergies Allergen Reactions   Cefepime  Hives    Had allergic reaction to one of these 3 agents, unclear which one   Metoprolol  Hives    Had allergic reaction to one of these 3 agents, unclear which one  *Per RN, highly likely this is the  cause of allergic rxn   Myrbetriq  [Mirabegron ] Hives    Had allergic reaction to one of these 3 agents, unclear which one

## 2024-08-15 NOTE — Telephone Encounter (Signed)
 Copied from CRM #[id number]. Topic: General - Call Back - No Documentation >> Aug 10, 2024  9:34 AM Harlene ORN wrote: Reason for CRM: Patient is returning a call from the Practice. Please call back the patient to discuss.

## 2024-08-15 NOTE — Progress Notes (Signed)
 Notified by vascular surgery that the patient will need a femoral artery bypass on Wednesday.  Cardiology consulted for cardiac clearance for this procedure.  Podiatry will hold off on transmet amputation at this time until better blood supply obtained. Dr. Charlie Patterson

## 2024-08-15 NOTE — Progress Notes (Signed)
" ° °  PODIATRY PROGRESS NOTE Patient Name: [name]  DOB 04/01/1956 DOA 08/10/2024  Hospital Day: 6  Assessment:  69 y.o. male with PMHx significant for  PAD, osteomyelitis s/p toe amputation, DM, HTN, bladder cancer, paroxysmal atrial fibrillation on Eliquis , depression with cellulitic left foot with worsening appearance of wound.   Concern for progressing left forefoot gangrene, continuing to dermacate  Plan:  - Will plan for Left foot transmetatarsal amputation tomorrow vs Wednesday pending vascular procedure. He is agreeable to proceed. NPO p MN in even patient goes to OR tomorrow.  - Continue broad spectrum IV abx - Anticoagulation: ok to continue per vascular/primary - Wound care: Betadine wet-to-dry dressings applied to left subfirst metatarsal head ulceration and lateral fifth MPJ lesion.  Change dressing daily - WB status: Partial weightbearing left foot in surgical shoe Will continue to follow        [name], DPM [facility]    Subjective:  Pt seen with Dr. Josette, discussed concern for necrosis of the plantar forefoot due to lack of bloodflow. Discussed would recommend a transmetatarsal amputation to try to get back to a level that would heal. He is agreeable to this as a salvage effort, understands high risk of nonhealing amputation due to PAD on the LLE. Also discussed if felt not possible by vascular surgery he may need BKA/AKA regardless.   Objective:   Vitals:   08/14/24 1601 08/14/24 2049  BP: (!) 150/68 (!) 150/71  Pulse: 67 69  Resp: 17 18  Temp: 98 F (36.7 C) 98.6 F (37 C)  SpO2: 97% 99%       Latest Ref Rng & Units 08/15/2024    5:34 AM 08/14/2024    5:38 AM 08/13/2024    7:06 AM  CBC  WBC 4.0 - 10.5 K/uL 10.7  10.6  11.0   Hemoglobin 13.0 - 17.0 g/dL 87.5  87.7  87.8   Hematocrit 39.0 - 52.0 % 36.2  35.2  35.4   Platelets 150 - 400 K/uL 330  314  296        Latest Ref Rng & Units 08/15/2024    5:34 AM 08/14/2024    5:38 AM  08/13/2024    7:06 AM  BMP  Glucose 70 - 99 mg/dL   767   BUN 8 - 23 mg/dL   15   Creatinine 9.38 - 1.24 mg/dL 9.03  9.08  9.16   Sodium 135 - 145 mmol/L   135   Potassium 3.5 - 5.1 mmol/L   3.7   Chloride 98 - 111 mmol/L   98   CO2 22 - 32 mmol/L   29   Calcium  8.9 - 10.3 mg/dL   8.6     General: AAOx3, NAD  Lower Extremity Exam  Non palp DP and PT on the left foot  Necrotic skin on the plantar forefoot with discoloration, also discoloration around the 1st MPJ ulceration with erythema  2nd toe with dorsal arterial wound/eschar with dusky discoloration distally  Prior 5th toe amputation, eschar present at the lateral aspect amputation site        Radiology:  Results reviewed. See assessment for pertinent imaging results  "

## 2024-08-15 NOTE — Progress Notes (Signed)
 " Progress Note   Patient: [name] FMW:[id number] DOB: Oct 29, 1955 DOA: 08/10/2024     5 DOS: the patient was seen and examined on 08/15/2024   Brief hospital course: 69 y.o. male with medical history significant for PAD, osteomyelitis s/p toe amputation, DM, HTN, bladder cancer, paroxysmal atrial fibrillation on Eliquis , , depression being admitted for cellulitis left foot with possible osteomyelitis.  He was admitted for the same in August 2025, undergoing amputation of the left great toe at that time, and he was treated with meropenem -> Cipro  to complete 4 weeks after OR cultures grew Pseudomonas, Enterobacter and Proteus.  Today he presented by EMS with pain and swelling of the left foot with redness as well as dizziness and weakness, which caused him to fall hitting his head on 1 occasion but without loss of consciousness.  He reports not having electricity in the house and cannot power his cell phone.  He decided to come into the ED to get checked out. In the ED, temp of 99.2 with otherwise normal vitals.  Labs notable for WBC 19,000 but otherwise unremarkable. EKG with no acute concerning findings Venous ultrasound negative for DVT Foot x-ray showed the following: IMPRESSION: 1. Soft tissue swelling along the plantar surface of the forefoot at the amputation site with subcutaneous gas. 2. Mild cortical irregularity along the medial margin of the remaining first metatarsal head, which could indicate osteomyelitis. 3. 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   The ED provider reached out to podiatrist, Dr.  Lamount who recommended Zosyn  and vancomycin  and get MRI.  No mention of OR  MRI of the foot did not show any convincing evidence of acute osteomyelitis at this time.  1/2.  Patient was contemplating leaving the hospital.  I told him we need to get lab draws if were on heparin  drip. 1/3.  Sugars finally up we will restart Lantus  insulin .   Patient states that he does not have transportation at home or power.  He is interested in returning home.  Morganella resistant to all oral antibiotics. 1/4.  Staph aureus in the wound sensitive to oxacillin, Enterococcus in the wound sensitive to ampicillin .  Zosyn  would cover all of these organisms.  Discontinue vancomycin .  For angiogram tomorrow. 1/5.  Angiogram today.  Case discussed with podiatry and will need at least a transmet amputation but further discussion was may be needed depending on angiogram results.  Assessment and Plan: * Gangrene (HCC) Tissue on the bottom of the foot and second toe does not look good.  Case discussed with podiatry and will need at least a transmet amputation depending on today's angiogram report.  May end up needing a BKA if circulation not good.  Cellulitis of left foot Also with numerous foot wounds and starting of gangrene.  Patient started on aggressive antibiotics initially with vancomycin  and Zosyn .  Vancomycin  discontinued on 1/4 since Zosyn  will cover all 3 organisms.  Appreciate podiatry and vascular surgery consultations.  Angiogram today.  MRI does not show any signs of osteomyelitis but tissue on the bottom of the foot does not look good.  Wound culture growing moderate Staph aureus, Morganella and Enterococcus.   Morganella does show resistance and we do not have an oral choice.    Uncontrolled type 2 diabetes mellitus with hypoglycemia, with long-term current use of insulin  [facility]) Patient had a sugar of 38 on 08/11/2024.  Restarted lower dose of Lantus  2 days ago.  Continue to monitor sugars closely.  PAD (peripheral artery disease) Holding Eliquis  for angiogram today.  On heparin  drip.  PAF (paroxysmal atrial fibrillation) (HCC) Continue heparin  drip and amiodarone   CAD (coronary artery disease) Continue heparin , atorvastatin   Essential hypertension Continue irbesartan , spironolactone  and Lasix   Bladder cancer (HCC) .  Housing  instability TOC following  Tobacco abuse Nicotine patch.  Patient must stop smoking.        Subjective: Patient complaining of foot pain today.  Case discussed with podiatrist and he will need a transmet amputation but will check blood supply first today.  Physical Exam: Vitals:   08/14/24 2049 08/15/24 0913 08/15/24 1430 08/15/24 1453  BP: (!) 150/71 (!) 155/81 (!) 167/67   Pulse: 69 70 74   Resp: 18 18 (!) 22   Temp: 98.6 F (37 C) 98.4 F (36.9 C) 98.4 F (36.9 C)   TempSrc: Oral  Oral   SpO2: 99% 94% 94% 97%  Weight:   68 kg   Height:   5' 11 (1.803 m)    Physical Exam HENT:     Head: Normocephalic.  Eyes:     General: Lids are normal.  Cardiovascular:     Rate and Rhythm: Normal rate and regular rhythm.     Heart sounds: Normal heart sounds, S1 normal and S2 normal.  Pulmonary:     Breath sounds: No decreased breath sounds, wheezing, rhonchi or rales.  Abdominal:     Palpations: Abdomen is soft.     Tenderness: There is no abdominal tenderness.  Musculoskeletal:     Right lower leg: No swelling.     Left lower leg: No swelling.  Skin:    General: Skin is warm.     Comments: See pictures below  Neurological:     Mental Status: He is alert and oriented to person, place, and time.        Data Reviewed: Creatinine 0.96, white blood cell count 10.7, hemoglobin 12.4, platelet count 330  Disposition: Status is: Inpatient Remains inpatient appropriate because: Patient for angiogram today.  Depending on angiogram results may need a higher level of amputation but will at least need a transmet amputation.  Planned Discharge Destination: Home with Home Health    Time spent: 28 minutes  Author: Charlie Patterson, MD 08/15/2024 3:01 PM  For on call review [email or web address].  "

## 2024-08-15 NOTE — Interval H&P Note (Signed)
 History and Physical Interval Note:  08/15/2024 2:25 PM  [name]  has presented today for surgery, with the diagnosis of left foot ulcer.  The various methods of treatment have been discussed with the patient and family. After consideration of risks, benefits and other options for treatment, the patient has consented to  Procedures: ABDOMINAL AORTOGRAM W/LOWER EXTREMITY (N/A) as a surgical intervention.  The patient's history has been reviewed, patient examined, no change in status, stable for surgery.  I have reviewed the patient's chart and labs.  Questions were answered to the patient's satisfaction.     [name]

## 2024-08-15 NOTE — Progress Notes (Signed)
 PHARMACY - ANTICOAGULATION CONSULT NOTE  Pharmacy Consult for UFH Indication: atrial fibrillation  Allergies[1]  Patient Measurements: Height: 5' 11 (180.3 cm) Weight: 68 kg (149 lb 14.6 oz) IBW/kg (Calculated) : 75.3 HEPARIN  DW (KG): 68  Labs: Recent Labs    08/13/24 0706 08/13/24 1628 08/14/24 0538 08/14/24 1307 08/15/24 0534 08/15/24 1311  HGB 12.1*  --  12.2*  --  12.4*  --   HCT 35.4*  --  35.2*  --  36.2*  --   PLT 296  --  314  --  330  --   HEPARINUNFRC 0.26*   < > 0.28* 0.36 0.17* 0.37  CREATININE 0.83  --  0.91  --  0.96  --    < > = values in this interval not displayed.    Estimated Creatinine Clearance: 71.8 mL/min (by C-G formula based on SCr of 0.96 mg/dL).   Medical History: Past Medical History:  Diagnosis Date   Adenomatous colon polyp    Allergic rhinitis    At risk for sleep apnea    STOP-BANG= 5   SENT TO PCP 03-14-2014   Bladder cancer (HCC)    CAD (coronary artery disease)    a. 07/2014 low risk MV; b. 07/2019 Cor CTA (FFR): LM 25-49 (nl), LAD mild prox/mid plaque (nl), D1 25-49p(nl w/ abnl FFR of 0.73 in inf branch), LCX/OM1 mild prox/mid plaque (nl), RCA nondominant, minimal Ca2+ plaque (nl), RPDA (mildly abnl @ 0.79)-->Med Rx..   Cataract    surgically removed bilateral   Condyloma acuminatum of penis    Diabetic neuropathy (HCC)    Diastolic dysfunction    a. 05/2019 Echo: EF 60-65%, no rwam, mod LVH, impaired relaxation, nl RV size/fxn, trace MR, triv TR.   Diverticulosis    GERD (gastroesophageal reflux disease)    History of bladder cancer    s/p  turbt  2013/   transitional cell carcinoma--    History of condyloma acuminatum    PERINEAL AREA  W/ RECURRENCY   History of gout    Hyperlipidemia    Hypertension    Hypoxemia 03/09/2024   Lower urinary tract symptoms (LUTS)    PAF (paroxysmal atrial fibrillation) (HCC)    a. 06/2019 Event monitor: PAF <1% burden. Longest 3 mins 36 secs.   Productive cough    PSVT (paroxysmal  supraventricular tachycardia)    a. 06/2019 Event monitor: 112 episodes of SVT, longest 21 secs.   PVD (peripheral vascular disease) with claudication    a. 03/2014 LE art duplex: long segment occlusion of mid to distal R SFA; b. 03/2020 ABI: nl left and mildly improved R ABI->med rx.   Renal artery stenosis    Renal artery stenosis    a. 12/2020 Renal art duplex: RRA 1-59%, LRA >60%.   Smokers' cough (HCC)    Type 2 diabetes mellitus with insulin  therapy (HCC) 1992   monitor by  dr ellsion   Wears dentures    upper   Assessment: 69 y/o M admitted with LE cellulitis and possible osteomyelitis on apixaban  PTA for AF with last dose yesterday. Pharmacy consulted to initiate heparin  bridge therapy.  Update 1/1 after baseline labs -- baseline aPTT and HL both correlating. Will titrate by heparin  level.  Goal of Therapy:  Heparin  level 0.3-0.7 units/ml aPTT 66-102 seconds Monitor platelets by anticoagulation protocol: Yes  1/1 @ 1518: aPTT 43, HL 0.12 = SUBtherapeutic at 1050 un/hr 1/2 @ 0022: HL = < 0.1, SUBtherapeutic 1/2 @ 0843: HL = <  0.1, SUBtherapeutic 1/2 @ 1959: HL = < 0.1, SUBtherapeutic 1/3 @ 0706: HL = 0.26, SUBtherapeutic 1/3 @ 1628: HL = 0.16, SUBtherapeutic 1/3 @ 0015: HL = 0.37, therapeutic X 1  1/4 @ 1307: HL = 0.36, therapeutic x 2 1/5 @ 0534: HL = 0.17, SUBtherapeutic  1/5 @ 1311: HL = 0.37, therapeutic x 1   Plan:  HL therapeutic Continue heparin  infusion at 2400 units/hr Recheck HL in 6 hours to confirm Daily CBC while on heparin   Kayla JULIANNA Blew, PharmD, BCPS [facility] 08/15/2024 1:58 PM           [1]  Allergies Allergen Reactions   Cefepime  Hives    Had allergic reaction to one of these 3 agents, unclear which one   Metoprolol  Hives    Had allergic reaction to one of these 3 agents, unclear which one  *Per RN, highly likely this is the cause of allergic rxn   Myrbetriq  [Mirabegron ] Hives    Had allergic reaction to one of these 3  agents, unclear which one

## 2024-08-15 NOTE — Plan of Care (Signed)

## 2024-08-15 NOTE — Progress Notes (Signed)
 Occupational Therapy Treatment Patient Details Name: [name] MRN: [id number] DOB: 03-Mar-1956 Today's Date: 08/15/2024   History of present illness 69 y.o. male presented by EMS with pain, redness and swelling of the left foot as well as dizziness and weakness, which caused him to fall hitting his head. Per podiatry PWB to LLE in offloading post op shoe. 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   OT comments  Patient seen for OT treatment on this date. Upon arrival to room patient resting in bed, agreeable to treatment. Patient unsure of upcoming procedures, chart review performed and patient updated for scheduled transmetatarsal amputation for tomorrow and therefore NPO currently. Patient impulsive throughout tx, required max multimodal cues to ensure safety; limited insight into deficits; refused to don post op shoe and opted to be NWB vs PWB with shoe on; had near LOB during standing and required mod A by OT to ensure balance maintained. Patient performed SPT from EOB to [facility] for toileting and SPT from [facility] to recliner (see below for levels of assist needed).  Patient frustrated with chair alarm at end of visit but in remains on and in place.  Patient ended treatment in recliner with bed/chair alarm on and all needs within reach. Patient making fair progress toward goals, will continue to follow POC. Discharge recommendation remains appropriate.        If plan is discharge home, recommend the following:  A little help with walking and/or transfers;A little help with bathing/dressing/bathroom;Help with stairs or ramp for entrance;Assistance with cooking/housework   Equipment Recommendations  BSC/3in1    Recommendations for Other Services      Precautions / Restrictions Precautions Precautions: Fall Recall of Precautions/Restrictions:  Impaired Restrictions Weight Bearing Restrictions Per Provider Order: Yes LLE Weight Bearing Per Provider Order: Partial weight bearing LLE Partial Weight Bearing Percentage or Pounds: offloading post op shoe       Mobility Bed Mobility Overal bed mobility: Needs Assistance Bed Mobility: Supine to Sit     Supine to sit: Supervision          Transfers Overall transfer level: Needs assistance Equipment used: 1 person hand held assist Transfers: Bed to chair/wheelchair/BSC Sit to Stand: Contact guard assist     Step pivot transfers: Contact guard assist, Min assist     General transfer comment: patient with poor safety awareness/impuslve during all transfers requiring constant CGA     Balance Overall balance assessment: Needs assistance Sitting-balance support: Feet supported, No upper extremity supported Sitting balance-Leahy Scale: Normal     Standing balance support: Reliant on assistive device for balance, During functional activity Standing balance-Leahy Scale: Fair                             ADL either performed or assessed with clinical judgement   ADL Overall ADL's : Needs assistance/impaired                     Lower Body Dressing: Moderate assistance;Sit to/from stand Lower Body Dressing Details (indicate cue type and reason): A required to ensure balance maintained while pulling up underpants Toilet Transfer: Contact guard assist;Rolling walker (2 wheels) Toilet Transfer Details (indicate cue type and reason): bed to [facility] Toileting- Clothing Manipulation and Hygiene: Contact guard assist  Extremity/Trunk Assessment              Occupational Psychologist Communication: No apparent difficulties   Cognition Arousal: Alert Behavior During Therapy: Impulsive, Agitated Cognition: No family/caregiver present to determine baseline             OT - Cognition  Comments: easily disracted                 Following commands: Intact        Cueing   Cueing Techniques: Verbal cues  Exercises      Shoulder Instructions       General Comments      Pertinent Vitals/ Pain       Pain Assessment Pain Assessment: No/denies pain  Home Living Family/patient expects to be discharged to:: Unsure                                        Prior Functioning/Environment              Frequency  Min 2X/week        Progress Toward Goals  OT Goals(current goals can now be found in the care plan section)  Progress towards OT goals: Progressing toward goals  Acute Rehab OT Goals Patient Stated Goal: to get out of here OT Goal Formulation: With patient Time For Goal Achievement: 08/26/24 Potential to Achieve Goals: Fair ADL Goals Pt Will Perform Lower Body Dressing: with modified independence;sitting/lateral leans;sit to/from stand Pt Will Transfer to Toilet: with modified independence;ambulating;regular height toilet;stand pivot transfer Pt Will Perform Toileting - Clothing Manipulation and hygiene: with modified independence;sitting/lateral leans;sit to/from stand  Plan      Co-evaluation                 AM-PAC OT 6 Clicks Daily Activity     Outcome Measure   Help from another person eating meals?: None Help from another person taking care of personal grooming?: None Help from another person toileting, which includes using toliet, bedpan, or urinal?: A Little Help from another person bathing (including washing, rinsing, drying)?: A Little Help from another person to put on and taking off regular upper body clothing?: None Help from another person to put on and taking off regular lower body clothing?: A Little 6 Click Score: 21    End of Session Equipment Utilized During Treatment: Other (comment) (cane)  OT Visit Diagnosis: Other abnormalities of gait and mobility (R26.89);Muscle weakness  (generalized) (M62.81);Pain Pain - Right/Left: Left Pain - part of body: Ankle and joints of foot   Activity Tolerance Patient tolerated treatment well   Patient Left in chair;with call bell/phone within reach;with chair alarm set   Nurse Communication Mobility status        Time: 8994-8975 OT Time Calculation (min): 19 min  Charges: OT General Charges $OT Visit: 1 Visit OT Treatments $Self Care/Home Management : 8-22 mins  Rogers Clause, OT/L MSOT, 08/15/2024

## 2024-08-15 NOTE — Op Note (Signed)
 [facility]  Percutaneous Study/Intervention Procedural Note   Date of Surgery: 08/15/2024  Surgeon(s):Tondalaya Perren    Assistants:none  Pre-operative Diagnosis: PAD with ulceration LLE  Post-operative diagnosis:  Same  Procedure(s) Performed:             1.  Ultrasound guidance for vascular access right femoral artery             2.  Catheter placement into left common femoral artery from right femoral approach             3.  Aortogram and selective left lower extremity angiogram             4.  StarClose closure device right femoral artery  EBL: 5 cc  Contrast: 40 cc  Fluoro Time: 1 minute  Moderate Conscious Sedation Time: approximately 24 minutes using 2 mg of Versed  and 50 mcg of Fentanyl               Indications:  Patient is a 69 y.o.male with nonhealing ulcerations of the left foot and previous intervention on the left lower extremity. The patient is brought in for angiography for further evaluation and potential treatment.  Due to the limb threatening nature of the situation, angiogram was performed for attempted limb salvage. The patient is aware that if the procedure fails, amputation would be expected.  The patient also understands that even with successful revascularization, amputation may still be required due to the severity of the situation.  Risks and benefits are discussed and informed consent is obtained.   Procedure:  The patient was identified and appropriate procedural time out was performed.  The patient was then placed supine on the table and prepped and draped in the usual sterile fashion. Moderate conscious sedation was administered during a face to face encounter with the patient throughout the procedure with my supervision of the RN administering medicines and monitoring the patient's vital signs, pulse oximetry, telemetry and mental status throughout from the start of the procedure until the patient was taken to the recovery room.  Ultrasound was used to evaluate the right common femoral artery.  It was patent .  A digital ultrasound image was acquired.  A Seldinger needle was used to access the right common femoral artery under direct ultrasound guidance and a permanent image was performed.  A 0.035 J wire was advanced without resistance and a 5Fr sheath was placed.  Pigtail catheter was placed into the aorta and an AP aortogram was performed. This demonstrated what appeared to be mild stenosis of the right renal artery and moderate stenosis of the left renal artery and normal aorta and iliac segments without significant stenosis although the iliac arteries were fairly calcific. I then crossed the aortic bifurcation and advanced to the left femoral head. Selective left lower extremity angiogram was then performed. 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  The degree of stenosis was greater than 80%.  This was a calcific and difficult lesion involving the bifurcation.  The SFA had poststenotic dilatation initially but then occluded above the previously placed stent in the mid SFA.  There was reconstitution of the above-knee popliteal artery and there appeared to be at least two-vessel runoff distally although the flow was quite sluggish and it was difficult to discern.  Both the posterior tibial and peroneal arteries did appear  to be patent distally. It was felt that it was in the patient's best interest to plan a left femoral endarterectomy likely with concomitant left SFA intervention in the operating room 1 day later this week.  I elected to terminate the procedure. The sheath was removed and StarClose closure device was deployed in the right femoral artery with excellent hemostatic result. The patient was taken to the recovery room in stable condition having tolerated  the procedure well.  Findings:               Aortogram:  This demonstrated what appeared to be mild stenosis of the right renal artery and moderate stenosis of the left renal artery and normal aorta and iliac segments without significant stenosis although the iliac arteries were fairly calcific.             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  The degree of stenosis was greater than 80%.  This was a calcific and difficult lesion involving the bifurcation.  The SFA had poststenotic dilatation initially but then occluded above the previously placed stent in the mid SFA.  There was reconstitution of the above-knee popliteal artery and there appeared to be at least two-vessel runoff distally although the flow was quite sluggish and it was difficult to discern.  Both the posterior tibial and peroneal arteries did appear to be patent distally.   Disposition: Patient was taken to the recovery room in stable condition having tolerated the procedure well.  Complications: None  Selinda Gu 08/15/2024 3:28 PM   This note was created with Dragon Medical transcription system. Any errors in dictation are purely unintentional.

## 2024-08-16 ENCOUNTER — Inpatient Hospital Stay: Admitting: Internal Medicine

## 2024-08-16 ENCOUNTER — Encounter: Admission: EM | Payer: Self-pay | Source: Home / Self Care | Attending: Internal Medicine

## 2024-08-16 ENCOUNTER — Encounter: Payer: Self-pay | Admitting: Internal Medicine

## 2024-08-16 DIAGNOSIS — Z01810 Encounter for preprocedural cardiovascular examination: Secondary | ICD-10-CM

## 2024-08-16 DIAGNOSIS — L089 Local infection of the skin and subcutaneous tissue, unspecified: Secondary | ICD-10-CM | POA: Diagnosis not present

## 2024-08-16 DIAGNOSIS — I739 Peripheral vascular disease, unspecified: Secondary | ICD-10-CM | POA: Diagnosis not present

## 2024-08-16 DIAGNOSIS — L97929 Non-pressure chronic ulcer of unspecified part of left lower leg with unspecified severity: Secondary | ICD-10-CM

## 2024-08-16 DIAGNOSIS — Z9889 Other specified postprocedural states: Secondary | ICD-10-CM | POA: Diagnosis not present

## 2024-08-16 DIAGNOSIS — I5020 Unspecified systolic (congestive) heart failure: Secondary | ICD-10-CM | POA: Diagnosis not present

## 2024-08-16 DIAGNOSIS — I70249 Atherosclerosis of native arteries of left leg with ulceration of unspecified site: Secondary | ICD-10-CM

## 2024-08-16 DIAGNOSIS — I480 Paroxysmal atrial fibrillation: Secondary | ICD-10-CM | POA: Diagnosis not present

## 2024-08-16 DIAGNOSIS — I272 Pulmonary hypertension, unspecified: Secondary | ICD-10-CM | POA: Diagnosis not present

## 2024-08-16 DIAGNOSIS — I25119 Atherosclerotic heart disease of native coronary artery with unspecified angina pectoris: Secondary | ICD-10-CM | POA: Diagnosis not present

## 2024-08-16 HISTORY — PX: RIGHT/LEFT HEART CATH AND CORONARY ANGIOGRAPHY: CATH118266

## 2024-08-16 HISTORY — PX: CORONARY PRESSURE/FFR STUDY: CATH118243

## 2024-08-16 LAB — BASIC METABOLIC PANEL WITH GFR
Anion gap: 10 (ref 5–15)
BUN: 17 mg/dL (ref 8–23)
CO2: 32 mmol/L (ref 22–32)
Calcium: 9.1 mg/dL (ref 8.9–10.3)
Chloride: 95 mmol/L — ABNORMAL LOW (ref 98–111)
Creatinine, Ser: 1.08 mg/dL (ref 0.61–1.24)
GFR, Estimated: >60 mL/min
Glucose, Bld: 190 mg/dL — ABNORMAL HIGH (ref 70–99)
Potassium: 3.8 mmol/L (ref 3.5–5.1)
Sodium: 137 mmol/L (ref 135–145)

## 2024-08-16 LAB — CBC
HCT: 36.2 % — ABNORMAL LOW (ref 39.0–52.0)
Hemoglobin: 12.6 g/dL — ABNORMAL LOW (ref 13.0–17.0)
MCH: 32.3 pg (ref 26.0–34.0)
MCHC: 34.8 g/dL (ref 30.0–36.0)
MCV: 92.8 fL (ref 80.0–100.0)
Platelets: 386 K/uL (ref 150–400)
RBC: 3.9 MIL/uL — ABNORMAL LOW (ref 4.22–5.81)
RDW: 13.7 % (ref 11.5–15.5)
WBC: 10.7 K/uL — ABNORMAL HIGH (ref 4.0–10.5)
nRBC: 0 % (ref 0.0–0.2)

## 2024-08-16 LAB — POCT I-STAT EG7
Acid-Base Excess: 5 mmol/L — ABNORMAL HIGH (ref 0.0–2.0)
Bicarbonate: 31 mmol/L — ABNORMAL HIGH (ref 20.0–28.0)
Calcium, Ion: 1.12 mmol/L — ABNORMAL LOW (ref 1.15–1.40)
HCT: 36 % — ABNORMAL LOW (ref 39.0–52.0)
Hemoglobin: 12.2 g/dL — ABNORMAL LOW (ref 13.0–17.0)
O2 Saturation: 63 %
Potassium: 3.4 mmol/L — ABNORMAL LOW (ref 3.5–5.1)
Sodium: 130 mmol/L — ABNORMAL LOW (ref 135–145)
TCO2: 32 mmol/L (ref 22–32)
pCO2, Ven: 48.7 mmHg (ref 44–60)
pH, Ven: 7.412 (ref 7.25–7.43)
pO2, Ven: 33 mmHg (ref 32–45)

## 2024-08-16 LAB — POCT I-STAT 7, (LYTES, BLD GAS, ICA,H+H)
Acid-Base Excess: 6 mmol/L — ABNORMAL HIGH (ref 0.0–2.0)
Bicarbonate: 30.5 mmol/L — ABNORMAL HIGH (ref 20.0–28.0)
Calcium, Ion: 1.13 mmol/L — ABNORMAL LOW (ref 1.15–1.40)
HCT: 35 % — ABNORMAL LOW (ref 39.0–52.0)
Hemoglobin: 11.9 g/dL — ABNORMAL LOW (ref 13.0–17.0)
O2 Saturation: 93 %
Potassium: 3.5 mmol/L (ref 3.5–5.1)
Sodium: 134 mmol/L — ABNORMAL LOW (ref 135–145)
TCO2: 32 mmol/L (ref 22–32)
pCO2 arterial: 43.3 mmHg (ref 32–48)
pH, Arterial: 7.456 — ABNORMAL HIGH (ref 7.35–7.45)
pO2, Arterial: 62 mmHg — ABNORMAL LOW (ref 83–108)

## 2024-08-16 LAB — GLUCOSE, CAPILLARY
Glucose-Capillary: 123 mg/dL — ABNORMAL HIGH (ref 70–99)
Glucose-Capillary: 167 mg/dL — ABNORMAL HIGH (ref 70–99)
Glucose-Capillary: 289 mg/dL — ABNORMAL HIGH (ref 70–99)
Glucose-Capillary: 204 mg/dL — ABNORMAL HIGH (ref 70–99)
Glucose-Capillary: 183 mg/dL — ABNORMAL HIGH (ref 70–99)
Glucose-Capillary: 291 mg/dL — ABNORMAL HIGH (ref 70–99)

## 2024-08-16 LAB — POCT ACTIVATED CLOTTING TIME: Activated Clotting Time: 240 s

## 2024-08-16 LAB — HEPARIN LEVEL (UNFRACTIONATED): Heparin Unfractionated: 0.2 [IU]/mL — ABNORMAL LOW (ref 0.30–0.70)

## 2024-08-16 MED ORDER — HEPARIN SODIUM (PORCINE) 1000 UNIT/ML IJ SOLN
INTRAMUSCULAR | Status: DC | PRN
  Administered 2024-08-16 (×2): 3500 [IU] via INTRAVENOUS

## 2024-08-16 MED ORDER — ASPIRIN 81 MG PO CHEW
CHEWABLE_TABLET | ORAL | Status: AC
  Filled 2024-08-16: qty 1

## 2024-08-16 MED ORDER — LIDOCAINE HCL (PF) 1 % IJ SOLN
INTRAMUSCULAR | Status: DC | PRN
  Administered 2024-08-16: 2 mL
  Administered 2024-08-16: 5 mL

## 2024-08-16 MED ORDER — FUROSEMIDE 10 MG/ML IJ SOLN
INTRAMUSCULAR | Status: AC
  Filled 2024-08-16: qty 2

## 2024-08-16 MED ORDER — NITROGLYCERIN 1 MG/10 ML FOR IR/CATH LAB
INTRA_ARTERIAL | Status: AC
  Filled 2024-08-16: qty 10

## 2024-08-16 MED ORDER — HEPARIN (PORCINE) 25000 UT/250ML-% IV SOLN
2500.0000 [IU]/h | INTRAVENOUS | Status: DC
  Administered 2024-08-16 – 2024-08-17 (×2): 2500 [IU]/h via INTRAVENOUS
  Filled 2024-08-16: qty 250

## 2024-08-16 MED ORDER — SODIUM CHLORIDE 0.9% IV SOLUTION: Freq: Once | INTRAVENOUS | Status: DC

## 2024-08-16 MED ORDER — FENTANYL CITRATE (PF) 100 MCG/2ML IJ SOLN
INTRAMUSCULAR | Status: DC | PRN
  Administered 2024-08-16 (×2): 25 ug via INTRAVENOUS

## 2024-08-16 MED ORDER — SODIUM CHLORIDE 0.9 % IV SOLN: 250.0000 mL | INTRAVENOUS | Status: AC | PRN

## 2024-08-16 MED ORDER — HEPARIN (PORCINE) 25000 UT/250ML-% IV SOLN
2500.0000 [IU]/h | INTRAVENOUS | Status: DC
  Filled 2024-08-16: qty 250

## 2024-08-16 MED ORDER — FENTANYL CITRATE (PF) 100 MCG/2ML IJ SOLN
INTRAMUSCULAR | Status: AC
  Filled 2024-08-16: qty 2

## 2024-08-16 MED ORDER — SODIUM CHLORIDE 0.9% FLUSH: 3.0000 mL | INTRAVENOUS | Status: DC | PRN

## 2024-08-16 MED ORDER — MIDAZOLAM HCL (PF) 2 MG/2ML IJ SOLN
INTRAMUSCULAR | Status: DC | PRN
  Administered 2024-08-16: 1 mg via INTRAVENOUS

## 2024-08-16 MED ORDER — VERAPAMIL HCL 2.5 MG/ML IV SOLN
INTRAVENOUS | Status: DC | PRN
  Administered 2024-08-16 (×2): 2.5 mg via INTRAVENOUS

## 2024-08-16 MED ORDER — HEPARIN (PORCINE) IN NACL 2000-0.9 UNIT/L-% IV SOLN
INTRAVENOUS | Status: DC | PRN
  Administered 2024-08-16: 1000 mL

## 2024-08-16 MED ORDER — HYDRALAZINE HCL 20 MG/ML IJ SOLN: 10.0000 mg | INTRAMUSCULAR | Status: AC | PRN

## 2024-08-16 MED ORDER — POTASSIUM CHLORIDE CRYS ER 20 MEQ PO TBCR
EXTENDED_RELEASE_TABLET | ORAL | Status: AC
  Filled 2024-08-16: qty 2

## 2024-08-16 MED ORDER — POTASSIUM CHLORIDE CRYS ER 20 MEQ PO TBCR
40.0000 meq | EXTENDED_RELEASE_TABLET | Freq: Once | ORAL | Status: AC
  Administered 2024-08-16: 40 meq via ORAL

## 2024-08-16 MED ORDER — HEPARIN SODIUM (PORCINE) 1000 UNIT/ML IJ SOLN
INTRAMUSCULAR | Status: AC
  Filled 2024-08-16: qty 10

## 2024-08-16 MED ORDER — ASPIRIN 81 MG PO CHEW
81.0000 mg | CHEWABLE_TABLET | ORAL | Status: AC
  Administered 2024-08-16: 81 mg via ORAL

## 2024-08-16 MED ORDER — MIDAZOLAM HCL 2 MG/2ML IJ SOLN
INTRAMUSCULAR | Status: AC
  Filled 2024-08-16: qty 2

## 2024-08-16 MED ORDER — HEPARIN (PORCINE) IN NACL 1000-0.9 UT/500ML-% IV SOLN
INTRAVENOUS | Status: AC
  Filled 2024-08-16: qty 1000

## 2024-08-16 MED ORDER — VERAPAMIL HCL 2.5 MG/ML IV SOLN
INTRAVENOUS | Status: AC
  Filled 2024-08-16: qty 2

## 2024-08-16 MED ORDER — INSULIN GLARGINE 100 UNIT/ML ~~LOC~~ SOLN
8.0000 [IU] | Freq: Every day | SUBCUTANEOUS | Status: DC
  Administered 2024-08-16 – 2024-08-18 (×2): 8 [IU] via SUBCUTANEOUS
  Filled 2024-08-16 (×4): qty 0.08

## 2024-08-16 MED ORDER — FREE WATER
500.0000 mL | Freq: Once | Status: AC
  Administered 2024-08-16: 500 mL via ORAL

## 2024-08-16 MED ORDER — FUROSEMIDE 10 MG/ML IJ SOLN
20.0000 mg | Freq: Once | INTRAMUSCULAR | Status: AC
  Administered 2024-08-16: 20 mg via INTRAVENOUS

## 2024-08-16 MED ORDER — NITROGLYCERIN 1 MG/10 ML FOR IR/CATH LAB
INTRA_ARTERIAL | Status: DC | PRN
  Administered 2024-08-16: 200 ug

## 2024-08-16 MED ORDER — SODIUM CHLORIDE 0.9% FLUSH
3.0000 mL | Freq: Two times a day (BID) | INTRAVENOUS | Status: DC
  Administered 2024-08-16: 3 mL via INTRAVENOUS

## 2024-08-16 MED ORDER — LIDOCAINE HCL 1 % IJ SOLN
INTRAMUSCULAR | Status: AC
  Filled 2024-08-16: qty 20

## 2024-08-16 MED ORDER — IOHEXOL 300 MG/ML  SOLN
INTRAMUSCULAR | Status: DC | PRN
  Administered 2024-08-16: 70 mL

## 2024-08-16 MED ORDER — SODIUM CHLORIDE 0.9 % IV SOLN: INTRAVENOUS | Status: AC | PRN

## 2024-08-16 MED ORDER — HEPARIN BOLUS VIA INFUSION
1000.0000 [IU] | Freq: Once | INTRAVENOUS | Status: AC
  Administered 2024-08-16: 1000 [IU] via INTRAVENOUS
  Filled 2024-08-16: qty 1000

## 2024-08-16 NOTE — H&P (View-Only) (Signed)
 "  Cardiology Consultation:   Patient ID: [name]; [id number]; 03-25-1956   Admit date: 08/10/2024 Date of Consult: 08/16/2024  Primary Care Provider: Norleen Lynwood LELON, MD Primary Cardiologist: Darron Primary Electrophysiologist:  None   Patient Profile:   [name] is a 69 y.o. male with a hx of nonobstructive CAD by coronary CTA in 2020, HFrEF, PAF, PAD status post multiple interventions complicated by osteomyelitis status post toe amputation, insulin -dependent diabetes mellitus diagnosed in 1992, refractory HTN, HLD, bladder cancer, and ongoing tobacco use  who is being seen today for preoperative cardiac risk stratification for femoral endarterectomy and transmetatarsal amputation at the request of Dr. Josette, MD.  History of Present Illness:   [name] has a long history of PAD, undergoing multiple interventions over the years dating back to at least 2015.  In late 2015 he had atypical chest pain with nuclear stress test showing a small inferolateral infarct with no significant ischemia and was managed medically given chest pain resolved.  He was admitted in 05/2019 with sepsis secondary to UTI and pyelonephritis as well as hypertensive urgency.  Troponin was mildly elevated at 741.  CTA of the chest was negative for PE, though he was noted to have coronary artery calcification.  Echo during the admission showed an EF of 60 to 65%, moderate LVH, and no significant valvular abnormalities.  During his admission he was noted to have short runs of SVT in the setting of electrolyte abnormalities.  Subsequent coronary CTA in 07/2019 showed a calcium  score 412 with mild left main stenosis and mild first diagonal stenosis that were not significant by ctFFR.  Zio patch in 2020 showed normal sinus rhythm with an average rate of 76 bpm with frequent short runs of SVT as well as PAF with a burden of less than 1% with the longest episode lasting 3 minutes.  Burden of A-fib was not felt to justify  initiation of anticoagulation at that time.  In the setting of known refractory hypertension renal artery duplex was done in 2022 which was overall suboptimal and showed no significant right renal artery stenosis with borderline stenosis affecting the left renal artery.   He was seen in the ER in early 07/2024 with shortness of breath felt to be related to volume overload as well as recent mechanical fall.  BNP 801, high-sensitivity troponin 97 with a delta troponin 114.  Chest x-ray with left pleural effusion and acute left seventh lateral rib fracture without evidence of pneumothorax.  He was prescribed furosemide  20 mg daily.   He was admitted to outside hospital on 07/21/2024 through 07/29/2024 with sepsis due to right lower lobe pneumonia with admission complicated by COPD exacerbation, CHF exacerbation, A-fib with RVR, and electrolyte derangement.  Echo during admission demonstrated an EF of 30 to 35%.  He was initiated on GDMT including Toprol -XL, Entresto, and spironolactone .  During the admission, he developed A-fib with RVR and was placed on IV amiodarone  with subsequent transition to oral amiodarone  and initiated on apixaban .   He was admitted to the hospital on 08/10/2024 with cellulitis of the left foot complicated by gangrene initially treated with IV vancomycin  and Zosyn  with wound culture growing Staph aureus, Morganella, and Enterococcus.  He has been followed by internal medicine, podiatry, and vascular surgery.  MRI showed no evidence of osteomyelitis.  He underwent lower extremity angiogram on 08/15/2024 which demonstrated a napkin ring-like lesion in the distal left common femoral artery just above the femoral bifurcation associated with severe  calcific disease in the more proximal to mid left common femoral artery of greater than 80% stenosis.  Vascular surgery has recommended femoral endarterectomy with possible bypass and transmetatarsal amputation and request cardiac risk stratification  prior to procedure.   At the time of cardiology consult he is without symptoms of angina or cardiac decompensation but he reports a several week history of exertional chest pain and dyspnea with associated palpitations as well as several episodes of unsteady gait leading to mechanical falls.  He denies frank syncope.  Difficult to establish functional status due to significant comorbidities and underlying symptoms.  Continues to smoke 1 to 2 packs of cigarettes daily.  DASI: Difficult to assess secondary to comorbidities RCRI: Perioperative risk of major cardiac event is 11%    Past Medical History:  Diagnosis Date   Adenomatous colon polyp    Allergic rhinitis    At risk for sleep apnea    STOP-BANG= 5   SENT TO PCP 03-14-2014   Bladder cancer (HCC)    CAD (coronary artery disease)    a. 07/2014 low risk MV; b. 07/2019 Cor CTA (FFR): LM 25-49 (nl), LAD mild prox/mid plaque (nl), D1 25-49p(nl w/ abnl FFR of 0.73 in inf branch), LCX/OM1 mild prox/mid plaque (nl), RCA nondominant, minimal Ca2+ plaque (nl), RPDA (mildly abnl @ 0.79)-->Med Rx..   Cataract    surgically removed bilateral   Condyloma acuminatum of penis    Diabetic neuropathy (HCC)    Diastolic dysfunction    a. 05/2019 Echo: EF 60-65%, no rwam, mod LVH, impaired relaxation, nl RV size/fxn, trace MR, triv TR.   Diverticulosis    GERD (gastroesophageal reflux disease)    History of bladder cancer    s/p  turbt  2013/   transitional cell carcinoma--    History of condyloma acuminatum    PERINEAL AREA  W/ RECURRENCY   History of gout    Hyperlipidemia    Hypertension    Hypoxemia 03/09/2024   Lower urinary tract symptoms (LUTS)    PAF (paroxysmal atrial fibrillation) (HCC)    a. 06/2019 Event monitor: PAF <1% burden. Longest 3 mins 36 secs.   Productive cough    PSVT (paroxysmal supraventricular tachycardia)    a. 06/2019 Event monitor: 112 episodes of SVT, longest 21 secs.   PVD (peripheral vascular disease) with  claudication    a. 03/2014 LE art duplex: long segment occlusion of mid to distal R SFA; b. 03/2020 ABI: nl left and mildly improved R ABI->med rx.   Renal artery stenosis    Renal artery stenosis    a. 12/2020 Renal art duplex: RRA 1-59%, LRA >60%.   Smokers' cough (HCC)    Type 2 diabetes mellitus with insulin  therapy [facility]) 1992   monitor by  dr ellsion   Wears dentures    upper    Past Surgical History:  Procedure Laterality Date   ABDOMINAL AORTOGRAM W/LOWER EXTREMITY N/A 02/15/2024   Procedure: ABDOMINAL AORTOGRAM W/LOWER EXTREMITY;  Surgeon: Pearline Norman RAMAN, MD;  Location: [facility] INVASIVE CV LAB;  Service: Cardiovascular;  Laterality: N/A;   ABDOMINAL AORTOGRAM W/LOWER EXTREMITY N/A 08/15/2024   Procedure: ABDOMINAL AORTOGRAM W/LOWER EXTREMITY;  Surgeon: Marea Selinda RAMAN, MD;  Location: [facility] INVASIVE CV LAB;  Service: Cardiovascular;  Laterality: N/A;   AMPUTATION Left 01/02/2024   Procedure: AMPUTATION, FOOT, RAY;  Surgeon: Malvin Marsa FALCON, DPM;  Location: [facility] OR;  Service: Orthopedics/Podiatry;  Laterality: Left;  FIFTH TOE   AMPUTATION TOE Left 02/29/2024   Procedure:  AMPUTATION, LEFT GREAT TOE;  Surgeon: Malvin Marsa FALCON, DPM;  Location: [facility] OR;  Service: Orthopedics/Podiatry;  Laterality: Left;  Left Great toe partial amputation,  Debridement Left Heel   AMPUTATION TOE Left 03/29/2024   Procedure: AMPUTATION LEFT GREAT TOE;  Surgeon: Malvin Marsa FALCON, DPM;  Location: [facility] OR;  Service: Orthopedics/Podiatry;  Laterality: Left;  LEFT GREAT TOE   AXILLARY HIDRADENITIS EXCISION  1997   CARDIOVASCULAR STRESS TEST  07-24-2014  dr darron grass   Low risk lexiscan  nuclear study with apical thinning and small inferolateral wall infarct at mid & basal level , no ischemia/  normal LVF and wall motion , ef 59%   CATARACT EXTRACTION Left    CATARACT EXTRACTION W/ INTRAOCULAR LENS IMPLANT Right    CO2 LASER APPLICATION N/A 03/20/2014   Procedure: CO2 LASER APPLICATION,PENIS, GROIN,  ANUS;  Surgeon: Elspeth KYM Schultze, MD;  Location: [facility];  Service: General;  Laterality: N/A;   CO2 LASER APPLICATION N/A 05/21/2015   Procedure: CO2 LASER APPLICATION;  Surgeon: Mark Ottelin, MD;  Location: [facility] [place];  Service: Urology;  Laterality: N/A;   CONDYLOMA EXCISION/FULGURATION N/A 05/21/2015   Procedure: CONDYLOMA REMOVAL;  Surgeon: Mark Ottelin, MD;  Location: [facility];  Service: Urology;  Laterality: N/A;   HEMORRHOID SURGERY  10/24/2014   Procedure: HEMORRHOIDECTOMY;  Surgeon: Elspeth Schultze, MD;  Location: [facility];  Service: General;;   INCISION AND DRAINAGE ABSCESS Left 10/24/2014   Procedure: INCISION AND DRAINAGE ABSCESS;  Surgeon: Elspeth Schultze, MD;  Location: [facility] [place];  Service: General;  Laterality: Left;   INCISION AND DRAINAGE OF WOUND Left 03/29/2024   Procedure: IRRIGATION AND DEBRIDEMENT PLANTAR FOOT WOUND;  Surgeon: Malvin Marsa FALCON, DPM;  Location: [facility];  Service: Orthopedics/Podiatry;  Laterality: Left;  LEFT GREAT TOE   INGUINAL HIDRADENITIS EXCISION  1998, 1999   LASER ABLATION CONDOLAMATA N/A 03/20/2014   Procedure: EXAM UNDER ANESTHESIA, REMOVAL/ABLATION OF CONDYLOMATA PENIS,GROINS, ANUS, ANAL CANAL;  Surgeon: Elspeth KYM Schultze, MD;  Location: [facility];  Service: General;  Laterality: N/A;  groin and anus   LASER ABLATION CONDOLAMATA N/A 10/24/2014   Procedure: LASER ABLATION CONDOLAMATA;  Surgeon: Elspeth Schultze, MD;  Location: [facility] [place];  Service: General;  Laterality: N/A;   LASER ABLATION OF PENILE AND PERIANAL WARTS  07-29-2007  Dr. Schultze   LEFT SHOULDER SURGERY  2003   LOWER EXTREMITY ANGIOGRAPHY N/A 01/01/2024   Procedure: Lower Extremity Angiography;  Surgeon: Sheree Penne Bruckner, MD;  Location: [facility] INVASIVE CV LAB;  Service: Vascular;  Laterality: N/A;   LOWER EXTREMITY ANGIOGRAPHY N/A 02/15/2024   Procedure: Lower Extremity  Angiography;  Surgeon: Pearline Norman RAMAN, MD;  Location: [facility] INVASIVE CV LAB;  Service: Cardiovascular;  Laterality: N/A;   LOWER EXTREMITY INTERVENTION  01/01/2024   Procedure: LOWER EXTREMITY INTERVENTION;  Surgeon: Sheree Penne Bruckner, MD;  Location: [facility] INVASIVE CV LAB;  Service: Vascular;;   LOWER EXTREMITY INTERVENTION N/A 02/15/2024   Procedure: LOWER EXTREMITY INTERVENTION;  Surgeon: Pearline Norman RAMAN, MD;  Location: [facility] INVASIVE CV LAB;  Service: Cardiovascular;  Laterality: N/A;   MASS EXCISION N/A 10/24/2014   Procedure: EXCISION OF PERINEAL MASS/SINUS;  Surgeon: Elspeth Schultze, MD;  Location: [facility] [place];  Service: General;  Laterality: N/A;   MOHS SURGERY     back   MOHS SURGERY  2017   face   MULTIPLE TOOTH EXTRACTIONS     PERINEAL HIDRADENITIS EXCISION  1998,  1999   TRANSURETHRAL RESECTION OF BLADDER TUMOR  05/21/2012   Procedure: TRANSURETHRAL RESECTION OF BLADDER TUMOR (TURBT);  Surgeon: Mark C Ottelin, MD;  Location: [facility];  Service: Urology;  Laterality: N/A;        Home Meds: Prior to Admission medications  Medication Sig Start Date End Date Taking? Authorizing Provider  albuterol  (VENTOLIN  HFA) 108 (90 Base) MCG/ACT inhaler Inhale 2 puffs into the lungs every 4 (four) hours as needed for wheezing or shortness of breath. 06/11/24  Yes Ula Prentice SAUNDERS, MD  amiodarone  (PACERONE ) 200 MG tablet Take 200 mg by mouth daily. 07/29/24  Yes [provider]  atorvastatin  (LIPITOR ) 40 MG tablet TAKE 1 TABLET BY MOUTH ONCE DAILY 05/31/24  Yes Norleen Lynwood ORN, MD  citalopram  (CELEXA ) 20 MG tablet Take 1 tablet (20 mg total) by mouth daily. 05/31/24  Yes Norleen Lynwood ORN, MD  doxycycline  (VIBRA -TABS) 100 MG tablet Take 1 tablet (100 mg total) by mouth 2 (two) times daily. 08/09/24  Yes Norleen Lynwood ORN, MD  ELIQUIS  5 MG TABS tablet Take 5 mg by mouth 2 (two) times daily. 07/29/24  Yes [provider]  furosemide  (LASIX ) 40 MG tablet Take 40  mg by mouth daily. 07/29/24  Yes [provider]  insulin  glargine, 2 Unit Dial , (TOUJEO  MAX SOLOSTAR) 300 UNIT/ML Solostar Pen Inject 40 Units into the skin every morning. 05/31/24  Yes Norleen Lynwood ORN, MD  irbesartan  (AVAPRO ) 300 MG tablet Take 1 tablet (300 mg total) by mouth daily. 05/31/24  Yes Norleen Lynwood ORN, MD  pantoprazole  (PROTONIX ) 40 MG tablet Take 1 tablet (40 mg total) by mouth daily. 05/31/24  Yes Norleen Lynwood ORN, MD  Potassium Chloride  ER 20 MEQ TBCR Take 1 tablet by mouth daily. 07/29/24  Yes [provider]  spironolactone  (ALDACTONE ) 25 MG tablet Take 25 mg by mouth daily. 07/29/24  Yes [provider]  Blood Glucose Monitoring Suppl (ACCU-CHEK GUIDE) w/Device KIT 1 Device by Does not apply route daily in the afternoon. 04/05/24   Shamleffer, Donell Cardinal, MD  Continuous Glucose Sensor (DEXCOM G7 SENSOR) MISC Change every 10 days 07/05/24   Shamleffer, Ibtehal Jaralla, MD  furosemide  (LASIX ) 20 MG tablet Take 1 tablet (20 mg total) by mouth daily. Patient not taking: Reported on 08/11/2024 07/13/24   Haze Lonni PARAS, MD  glucose blood (ACCU-CHEK GUIDE TEST) test strip 1 each by Other route 3 (three) times daily. Use as instructed 04/05/24   Shamleffer, Ibtehal Jaralla, MD  potassium chloride  SA (KLOR-CON  M) 20 MEQ tablet Take 2 tablets (40 mEq total) by mouth daily. Patient not taking: Reported on 08/11/2024 07/13/24   Haze Lonni PARAS, MD  predniSONE  (DELTASONE ) 50 MG tablet Take 1 tablet by mouth daily Patient not taking: Reported on 08/11/2024 06/11/24   Ula Prentice SAUNDERS, MD    Inpatient Medications: Scheduled Meds:  amiodarone   200 mg Oral Daily   atorvastatin   40 mg Oral Daily   citalopram   20 mg Oral Daily   furosemide   40 mg Oral Daily   guaiFENesin   600 mg Oral BID   insulin  aspart  0-5 Units Subcutaneous QHS   insulin  aspart  0-6 Units Subcutaneous TID WC   insulin  glargine  20 Units Subcutaneous Daily   irbesartan   300 mg Oral Daily    pantoprazole   40 mg Oral Daily   spironolactone   25 mg Oral Daily   Continuous Infusions:  sodium chloride  10 mL/hr at 08/16/24 0410   heparin  2,400  Units/hr (08/15/24 2259)   piperacillin -tazobactam (ZOSYN )  IV 3.375 g (08/16/24 0411)   PRN Meds: sodium chloride , acetaminophen  **OR** acetaminophen , albuterol , guaiFENesin -dextromethorphan , HYDROcodone -acetaminophen , morphine  injection, ondansetron  **OR** ondansetron  (ZOFRAN ) IV  Allergies:  Allergies[1]  Social History:   Social History   Socioeconomic History   Marital status: Widowed    Spouse name: Not on file   Number of children: 0   Years of education: Not on file   Highest education level: Not on file  Occupational History   Occupation: Disabled/retired  Tobacco Use   Smoking status: Every Day    Current packs/day: 0.00    Average packs/day: 1 pack/day for 41.0 years (41.0 ttl pk-yrs)    Types: Cigars, Cigarettes    Start date: 58    Last attempt to quit: 2017    Years since quitting: 9.0   Smokeless tobacco: Never   Tobacco comments:    Quit cigarettes in 2017 but started back smoking Cigars  Vaping Use   Vaping status: Former  Substance and Sexual Activity   Alcohol use: No    Alcohol/week: 0.0 standard drinks of alcohol   Drug use: No   Sexual activity: Not Currently  Other Topics Concern   Not on file  Social History Narrative   Not on file   Social Drivers of Health   Tobacco Use: High Risk (08/10/2024)   Patient History    Smoking Tobacco Use: Every Day    Smokeless Tobacco Use: Never    Passive Exposure: Not on file  Financial Resource Strain: High Risk (03/11/2024)   Overall Financial Resource Strain (CARDIA)    Difficulty of Paying Living Expenses: Very hard  Food Insecurity: Food Insecurity Present (08/11/2024)   Epic    Worried About Programme Researcher, Broadcasting/film/video in the Last Year: Often true    Ran Out of Food in the Last Year: Often true  Transportation Needs: Unmet Transportation Needs (08/11/2024)    Epic    Lack of Transportation (Medical): Yes    Lack of Transportation (Non-Medical): Yes  Physical Activity: Inactive (06/10/2023)   Exercise Vital Sign    Days of Exercise per Week: 0 days    Minutes of Exercise per Session: 0 min  Stress: Stress Concern Present (03/11/2024)   [employer] of Occupational Health - Occupational Stress Questionnaire    Feeling of Stress: Very much  Social Connections: Socially Isolated (08/11/2024)   Social Connection and Isolation Panel    Frequency of Communication with Friends and Family: More than three times a week    Frequency of Social Gatherings with Friends and Family: Never    Attends Religious Services: Never    Database Administrator or Organizations: No    Attends Banker Meetings: Never    Marital Status: Widowed  Intimate Partner Violence: Not At Risk (08/11/2024)   Epic    Fear of Current or Ex-Partner: No    Emotionally Abused: No    Physically Abused: No    Sexually Abused: No  Depression (PHQ2-9): Low Risk (07/05/2024)   Depression (PHQ2-9)    PHQ-2 Score: 0  Alcohol Screen: Low Risk (06/10/2023)   Alcohol Screen    Last Alcohol Screening Score (AUDIT): 0  Housing: High Risk (08/11/2024)   Epic    Unable to Pay for Housing in the Last Year: Yes    Number of Times Moved in the Last Year: 0    Homeless in the Last Year: No  Utilities: At Risk (08/15/2024)  Epic    Threatened with loss of utilities: Yes  Health Literacy: Inadequate Health Literacy (02/17/2024)   B1300 Health Literacy    Frequency of need for help with medical instructions: Sometimes     Family History:   Family History  Problem Relation Age of Onset   Hypertension Mother    Lung cancer Father    Diabetes Maternal Aunt        x 2   Colon cancer Neg Hx    Esophageal cancer Neg Hx    Pancreatic cancer Neg Hx    Prostate cancer Neg Hx    Kidney disease Neg Hx    Liver disease Neg Hx    Rectal cancer Neg Hx    Stomach cancer Neg Hx      ROS:  Review of Systems  Constitutional:  Positive for malaise/fatigue. Negative for chills, diaphoresis, fever and weight loss.  HENT:  Negative for congestion.   Eyes:  Negative for discharge and redness.  Respiratory:  Positive for shortness of breath. Negative for cough, sputum production and wheezing.   Cardiovascular:  Positive for chest pain, palpitations and claudication. Negative for orthopnea, leg swelling and PND.  Gastrointestinal:  Negative for abdominal pain, heartburn, nausea and vomiting.  Musculoskeletal:  Positive for falls. Negative for myalgias.  Skin:  Negative for rash.  Neurological:  Positive for weakness. Negative for dizziness, tingling, tremors, sensory change, speech change, focal weakness and loss of consciousness.  Endo/Heme/Allergies:  Does not bruise/bleed easily.  Psychiatric/Behavioral:  Negative for substance abuse. The patient is not nervous/anxious.   All other systems reviewed and are negative.     Physical Exam/Data:   Vitals:   08/15/24 1705 08/15/24 2106 08/16/24 0512 08/16/24 0724  BP: (!) 177/83 (!) 157/68 (!) 152/77 (!) 153/65  Pulse: 79 66 61 65  Resp: 17 17 17 16   Temp: 98.4 F (36.9 C) 98.7 F (37.1 C) 98.5 F (36.9 C)   TempSrc: Oral     SpO2: 93% 92% 94% 97%  Weight:      Height:        Intake/Output Summary (Last 24 hours) at 08/16/2024 0804 Last data filed at 08/16/2024 0547 Gross per 24 hour  Intake 568.32 ml  Output 750 ml  Net -181.68 ml   Filed Weights   08/10/24 2055 08/15/24 1430  Weight: 68 kg 68 kg   Body mass index is 20.91 kg/m.   Physical Exam: General: Well developed, well nourished, in no acute distress. Head: Normocephalic, atraumatic, sclera non-icteric, no xanthomas, nares without discharge.  Neck: Negative for carotid bruits. JVD not elevated. Lungs: Clear bilaterally to auscultation without wheezes, rales, or rhonchi. Breathing is unlabored. Heart: RRR with S1 S2. No murmurs, rubs, or gallops  appreciated. Abdomen: Soft, non-tender, non-distended with normoactive bowel sounds. No hepatomegaly. No rebound/guarding. No obvious abdominal masses. Msk:  Strength and tone appear normal for age. Extremities: Left foot bandaged/wrapped. Neuro: Alert and oriented X 3. No facial asymmetry. No focal deficit. Moves all extremities spontaneously. Psych:  Responds to questions appropriately with a normal affect.   EKG:  The EKG was personally reviewed and demonstrates: Not available for review Telemetry:  Telemetry was personally reviewed and demonstrates: Not currently on telemetry   Weights: Filed Weights   08/10/24 2055 08/15/24 1430  Weight: 68 kg 68 kg    Relevant CV Studies: As above  Laboratory Data:  Chemistry Recent Labs  Lab 08/10/24 2119 08/13/24 0706 08/14/24 0538 08/15/24 0534  NA 134* 135  --   --  K 3.5 3.7  --   --   CL 94* 98  --   --   CO2 29 29  --   --   GLUCOSE 141* 232*  --   --   BUN 11 15  --   --   CREATININE 0.84 0.83 0.91 0.96  CALCIUM  9.0 8.6*  --   --   GFRNONAA >60 >60 >60 >60  ANIONGAP 10 8  --   --     Recent Labs  Lab 08/10/24 2119  PROT 7.2  ALBUMIN  3.1*  AST 22  ALT 15  ALKPHOS 143*  BILITOT 0.9   Hematology Recent Labs  Lab 08/14/24 0538 08/15/24 0534 08/16/24 0436  WBC 10.6* 10.7* 10.7*  RBC 3.83* 3.86* 3.90*  HGB 12.2* 12.4* 12.6*  HCT 35.2* 36.2* 36.2*  MCV 91.9 93.8 92.8  MCH 31.9 32.1 32.3  MCHC 34.7 34.3 34.8  RDW 13.8 13.9 13.7  PLT 314 330 386   Cardiac EnzymesNo results for input(s): TROPONINI in the last 168 hours. No results for input(s): TROPIPOC in the last 168 hours.  BNPNo results for input(s): BNP, PROBNP in the last 168 hours.  DDimer No results for input(s): DDIMER in the last 168 hours.  Radiology/Studies:  PERIPHERAL VASCULAR CATHETERIZATION Result Date: 08/15/2024 See surgical note for result.  US  ARTERIAL ABI (SCREENING LOWER EXTREMITY) Result Date: 08/12/2024 IMPRESSION: 1.  Right ABI is 0.59 and compatible with moderate peripheral arterial disease. 2. Left ABI is 0.45 and compatible with severe peripheral arterial disease. Electronically Signed   By: Juliene Balder M.D.   On: 08/12/2024 11:11    Assessment and Plan:   1. Cardiac risk stratification:  - Patient is planning to undergo left common femoral endarterectomy with possible bypass and transmetatarsal amputation in the setting of left lower extremity cellulitis complicated by gangrene  - He reports symptoms of angina and exertional dyspnea over the past several weeks - Per RCRI, perioperative risk of major cardiac event is 11%  - In the context of significant periprocedural risk, symptoms of angina with exertional dyspnea, and recently diagnosed HFrEF we will pursue [facility] later today to further understand his coronary anatomy, volume status, and assess with risk stratification - Obtain echo  2. CAD involving the native coronary arteries with angina:  - Currently without symptoms of angina - Pursue R/LHC as outlined above - On apixaban  in lieu of aspirin  prior to admission  - PTA atorvastatin  40 mg   3. HFrEF:  - Echo during admission at [facility] in 07/2024 with an EF of 30 to 35%, it is unclear if this was taken while in A-fib with RVR - Repeat study to confirm EF - Metoprolol  previously held for bradycardia during admission at outside hospital in 07/2024  - Currently on irbesartan  and spironolactone  -Does not appear grossly volume up, currently on oral furosemide  40 mg daily - Anticipate transitioning ARB to ARNI postprocedure as able, if not precluded by financial constraints - Escalate GDMT as indicated and able  4. PAF:  - Maintaining sinus rhythm by physical exam, place on telemetry and obtain EKG - Noted on prior outpatient cardiac monitor lasting 3 minutes  - At time of last office visit in our office in 2024, he was not maintained on anticoagulation given burden of A-fib did not justify  initiating this  - During admission at outside hospital in 07/2024 ED redeveloped A-fib with RVR in the setting of acute illness and volume overload leading to the initiation  of amiodarone  and apixaban   - Given age, long-term amiodarone  is not a great option  - No longer on metoprolol  due to bradycardia while on beta-blocker - On heparin  drip in place of apixaban  while admitted, resume DOAC prior to discharge  - CHA2DS2-VASc at least 5 (CHF, HTN, age x 1, DM, vascular disease)   5. PAD:  - Management per podiatry and vascular surgery   Refractory hypertension:  - Blood pressure  - Pharmacotherapy as above   6. HLD:  - LDL 42 in 02/2024  - PTA atorvastatin  40 mg    Informed Consent   Shared Decision Making/Informed Consent{  The risks [stroke (1 in 1000), death (1 in 1000), kidney failure [usually temporary] (1 in 500), bleeding (1 in 200), allergic reaction [possibly serious] (1 in 200)], benefits (diagnostic support and management of coronary artery disease) and alternatives of a cardiac catheterization were discussed in detail with Mr. Pullara and he is willing to proceed.       For questions or updates, please contact [facility] Please consult [email or web address] for contact info under Cardiology/STEMI.   Signed, Bernardino Bring, PA-C [facility] Pager: [phone] 08/16/2024, 8:04 AM        [1]  Allergies Allergen Reactions   Cefepime  Hives    Had allergic reaction to one of these 3 agents, unclear which one   Metoprolol  Hives    Had allergic reaction to one of these 3 agents, unclear which one  *Per RN, highly likely this is the cause of allergic rxn   Myrbetriq  [Mirabegron ] Hives    Had allergic reaction to one of these 3 agents, unclear which one   "

## 2024-08-16 NOTE — Progress Notes (Signed)
 " Progress Note   Patient: [name] FMW:[id number] DOB: 05-May-1956 DOA: 08/10/2024     6 DOS: the patient was seen and examined on 08/16/2024   Brief hospital course: 69 y.o. male with medical history significant for PAD, osteomyelitis s/p toe amputation, DM, HTN, bladder cancer, paroxysmal atrial fibrillation on Eliquis , , depression being admitted for cellulitis left foot with possible osteomyelitis.  He was admitted for the same in August 2025, undergoing amputation of the left great toe at that time, and he was treated with meropenem -> Cipro  to complete 4 weeks after OR cultures grew Pseudomonas, Enterobacter and Proteus.  Today he presented by EMS with pain and swelling of the left foot with redness as well as dizziness and weakness, which caused him to fall hitting his head on 1 occasion but without loss of consciousness.  He reports not having electricity in the house and cannot power his cell phone.  He decided to come into the ED to get checked out. In the ED, temp of 99.2 with otherwise normal vitals.  Labs notable for WBC 19,000 but otherwise unremarkable. EKG with no acute concerning findings Venous ultrasound negative for DVT Foot x-ray showed the following: IMPRESSION: 1. Soft tissue swelling along the plantar surface of the forefoot at the amputation site with subcutaneous gas. 2. Mild cortical irregularity along the medial margin of the remaining first metatarsal head, which could indicate osteomyelitis. 3. 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   The ED provider reached out to podiatrist, Dr.  Lamount who recommended Zosyn  and vancomycin  and get MRI.  No mention of OR  MRI of the foot did not show any convincing evidence of acute osteomyelitis at this time.  1/2.  Patient was contemplating leaving the hospital.  I told him we need to get lab draws if were on heparin  drip. 1/3.  Sugars finally up we will restart Lantus  insulin .   Patient states that he does not have transportation at home or power.  He is interested in returning home.  Morganella resistant to all oral antibiotics. 1/4.  Staph aureus in the wound sensitive to oxacillin, Enterococcus in the wound sensitive to ampicillin .  Zosyn  would cover all of these organisms.  Discontinue vancomycin .  For angiogram tomorrow. 1/5.  Angiogram today.  Case discussed with podiatry and will need at least a transmet amputation but further discussion was may be needed depending on angiogram results. 1/6.  Cardiology planning on cardiac catheterization this afternoon for cardiac clearance for femoral endarterectomy tomorrow.  Assessment and Plan: * Gangrene (HCC) Tissue on the bottom of the foot and second toe does not look good.  Case discussed with podiatry and will need at least a transmet amputation prior to disposition.  Podiatry will wait for better blood supply prior to this procedure.  Cellulitis of left foot Also with numerous foot wounds and starting of gangrene.  Patient started on aggressive antibiotics initially with vancomycin  and Zosyn .  Vancomycin  discontinued on 1/4 since Zosyn  will cover all 3 organisms.  Appreciate podiatry and vascular surgery consultations.  MRI does not show any signs of osteomyelitis but tissue on the bottom of the foot does not look good.  Wound culture growing moderate Staph aureus, Morganella and Enterococcus.   Morganella does show resistance and we do not have an oral choice.    Uncontrolled type 2 diabetes mellitus with hypoglycemia, with long-term current use of insulin  [facility]) Patient had a sugar of  38 on 08/11/2024.  Restarted lower dose of Lantus  2 days ago.  Continue to monitor sugars closely.  I lowered his Lantus  dose again today since he will be n.p.o. today and tomorrow.  PAD (peripheral artery disease) On heparin  drip.  Angiogram done on 1/5 and patient will need a femoral endarterectomy which is scheduled for 1/7.  Cardiac  catheterization today for cardiac clearance.  PAF (paroxysmal atrial fibrillation) (HCC) Continue heparin  drip and amiodarone   CAD (coronary artery disease) Continue heparin , atorvastatin   Essential hypertension Continue irbesartan , spironolactone  and Lasix   Bladder cancer (HCC) .  Housing instability TOC following  Tobacco abuse Nicotine patch.  Patient must stop smoking.        Subjective: Patient complains of some foot pain.  Had a fall trying to get off the commode.  Had some hip pain.  Initially came in with a fall and found to have numerous foot ulcers.  Physical Exam: Vitals:   08/15/24 2106 08/16/24 0512 08/16/24 0724 08/16/24 0900  BP: (!) 157/68 (!) 152/77 (!) 153/65 (!) 168/86  Pulse: 66 61 65 74  Resp: 17 17 16 17   Temp: 98.7 F (37.1 C) 98.5 F (36.9 C)    TempSrc:      SpO2: 92% 94% 97% 98%  Weight:      Height:       Physical Exam HENT:     Head: Normocephalic.  Eyes:     General: Lids are normal.  Cardiovascular:     Rate and Rhythm: Normal rate and regular rhythm.     Heart sounds: Normal heart sounds, S1 normal and S2 normal.  Pulmonary:     Breath sounds: No decreased breath sounds, wheezing, rhonchi or rales.  Abdominal:     Palpations: Abdomen is soft.     Tenderness: There is no abdominal tenderness.  Musculoskeletal:     Right lower leg: No swelling.     Left lower leg: No swelling.  Skin:    General: Skin is warm.  Neurological:     Mental Status: He is alert and oriented to person, place, and time.     Data Reviewed: White blood cell count 10.7, hemoglobin 12.6, platelet count 386   Disposition: Status is: Inpatient Remains inpatient appropriate because: Angiogram done yesterday shows that he needs a left femoral endarterectomy and likely bypass procedure.  Cardiology doing a cardiac catheterization today.  Will have vascular procedure tomorrow if cleared by cardiology.  Planned Discharge Destination: Home with Home  Health    Time spent: 28 minutes  Author: Charlie Patterson, MD 08/16/2024 1:49 PM  For on call review [email or web address].  "

## 2024-08-16 NOTE — Progress Notes (Signed)
 " Progress Note    08/16/2024 12:10 PM 1 Day Post-Op  Subjective:  [name] is a 69 yo male now POD #1 from:  Date of Surgery: 08/15/2024   Surgeon(s):DEW,JASON     Assistants:none   Pre-operative Diagnosis: PAD with ulceration LLE   Post-operative diagnosis:  Same   Procedure(s) Performed:             1.  Ultrasound guidance for vascular access right femoral artery             2.  Catheter placement into left common femoral artery from right femoral approach             3.  Aortogram and selective left lower extremity angiogram             4.  StarClose closure device right femoral artery   EBL: 5 cc   Contrast: 40 cc   Fluoro Time: 1 minute   Moderate Conscious Sedation Time: approximately 24 minutes using 2 mg of Versed  and 50 mcg of Fentanyl   Patient is resting comfortably in bed this morning.    Vitals:   08/16/24 0724 08/16/24 0900  BP: (!) 153/65 (!) 168/86  Pulse: 65 74  Resp: 16 17  Temp:    SpO2: 97% 98%   Physical Exam: Cardiac:  RRR, normal S1 and S2.  No murmurs appreciated. Lungs: Nonlabored breathing, lungs rhonchorous throughout on auscultation.  Patient has a long history of smoking.  No rales or wheezing noted this morning. Incisions: None Extremities: All extremities are warm to touch with palpable pulses except left lower extremity with ulcerations to his left foot.  Bandages in place for left foot care via podiatry. Abdomen: Positive bowel sounds throughout, soft, nontender nondistended. Neurologic: Alert and oriented x 3, answers all questions and follows commands appropriately.  CBC    Component Value Date/Time   WBC 10.7 (H) 08/16/2024 0436   RBC 3.90 (L) 08/16/2024 0436   HGB 12.6 (L) 08/16/2024 0436   HCT 36.2 (L) 08/16/2024 0436   PLT 386 08/16/2024 0436   MCV 92.8 08/16/2024 0436   MCH 32.3 08/16/2024 0436   MCHC 34.8 08/16/2024 0436   RDW 13.7 08/16/2024 0436   LYMPHSABS 1.7 08/10/2024 2119   MONOABS 1.0 08/10/2024 2119    EOSABS 0.2 08/10/2024 2119   BASOSABS 0.1 08/10/2024 2119    BMET    Component Value Date/Time   NA 135 08/13/2024 0706   NA 138 06/16/2019 1144   K 3.7 08/13/2024 0706   CL 98 08/13/2024 0706   CO2 29 08/13/2024 0706   GLUCOSE 232 (H) 08/13/2024 0706   BUN 15 08/13/2024 0706   BUN 15 06/16/2019 1144   CREATININE 0.96 08/15/2024 0534   CALCIUM  8.6 (L) 08/13/2024 0706   GFRNONAA >60 08/15/2024 0534   GFRAA >60 02/29/2020 0937    INR    Component Value Date/Time   INR 1.1 08/10/2024 2119     Intake/Output Summary (Last 24 hours) at 08/16/2024 1210 Last data filed at 08/16/2024 0547 Gross per 24 hour  Intake 568.32 ml  Output 750 ml  Net -181.68 ml     Assessment/Plan:  69 y.o. male is s/p SEE ABOVE  1 Day Post-Op   PLAN Patient to undergo cardiac catheterization later today.  This will help with cardiac clearance for surgery tomorrow.  Vascular surgery plans on taking the patient to the operating room tomorrow for a left lower extremity femoral endarterectomy with femoral artery to distal  popliteal bypass.  I discussed in detail with the patient again at the bedside this morning the procedure tomorrow.  We also discussed the risks, complications and the benefits of doing surgery.  He verbalizes understanding and agrees to proceed.  Patient will be made n.p.o. after midnight tonight for surgery tomorrow.  Patient was on a heparin  infusion prior to cardiac catheterization today.  Per vascular surgery okay to restart heparin  to be on-call to the perioperative area tomorrow for procedure as soon as cardiology agrees on the timing of the restart of the heparin  infusion post his cardiac catheterization today.  I discussed in detail the case with Dr. Selinda Gu and he agrees with the plan.  DVT prophylaxis: Heparin  infusion   [name] [facility] 08/16/2024 12:10 PM   "

## 2024-08-16 NOTE — Progress Notes (Signed)
 PHARMACY - ANTICOAGULATION CONSULT NOTE  Pharmacy Consult for UFH Indication: atrial fibrillation  Allergies[1]  Patient Measurements: Height: 5' 11 (180.3 cm) Weight: 68 kg (149 lb 14.6 oz) IBW/kg (Calculated) : 75.3 HEPARIN  DW (KG): 68  Labs: Recent Labs    08/14/24 0538 08/14/24 1307 08/15/24 0534 08/15/24 1311 08/16/24 0436 08/16/24 0819  HGB 12.2*  --  12.4*  --  12.6*  --   HCT 35.2*  --  36.2*  --  36.2*  --   PLT 314  --  330  --  386  --   HEPARINUNFRC 0.28*   < > 0.17* 0.37  --  0.20*  CREATININE 0.91  --  0.96  --   --   --    < > = values in this interval not displayed.    Estimated Creatinine Clearance: 71.8 mL/min (by C-G formula based on SCr of 0.96 mg/dL).   Medical History: Past Medical History:  Diagnosis Date   Adenomatous colon polyp    Allergic rhinitis    At risk for sleep apnea    STOP-BANG= 5   SENT TO PCP 03-14-2014   Bladder cancer (HCC)    CAD (coronary artery disease)    a. 07/2014 low risk MV; b. 07/2019 Cor CTA (FFR): LM 25-49 (nl), LAD mild prox/mid plaque (nl), D1 25-49p(nl w/ abnl FFR of 0.73 in inf branch), LCX/OM1 mild prox/mid plaque (nl), RCA nondominant, minimal Ca2+ plaque (nl), RPDA (mildly abnl @ 0.79)-->Med Rx..   Cataract    surgically removed bilateral   Condyloma acuminatum of penis    Diabetic neuropathy (HCC)    Diastolic dysfunction    a. 05/2019 Echo: EF 60-65%, no rwam, mod LVH, impaired relaxation, nl RV size/fxn, trace MR, triv TR.   Diverticulosis    GERD (gastroesophageal reflux disease)    History of bladder cancer    s/p  turbt  2013/   transitional cell carcinoma--    History of condyloma acuminatum    PERINEAL AREA  W/ RECURRENCY   History of gout    Hyperlipidemia    Hypertension    Hypoxemia 03/09/2024   Lower urinary tract symptoms (LUTS)    PAF (paroxysmal atrial fibrillation) (HCC)    a. 06/2019 Event monitor: PAF <1% burden. Longest 3 mins 36 secs.   Productive cough    PSVT (paroxysmal  supraventricular tachycardia)    a. 06/2019 Event monitor: 112 episodes of SVT, longest 21 secs.   PVD (peripheral vascular disease) with claudication    a. 03/2014 LE art duplex: long segment occlusion of mid to distal R SFA; b. 03/2020 ABI: nl left and mildly improved R ABI->med rx.   Renal artery stenosis    Renal artery stenosis    a. 12/2020 Renal art duplex: RRA 1-59%, LRA >60%.   Smokers' cough (HCC)    Type 2 diabetes mellitus with insulin  therapy (HCC) 1992   monitor by  dr ellsion   Wears dentures    upper   Assessment: 69 y/o M admitted with LE cellulitis and possible osteomyelitis on apixaban  PTA for AF with last dose yesterday. Pharmacy consulted to initiate heparin  bridge therapy.  Update 1/1 after baseline labs -- baseline aPTT and HL both correlating. Will titrate by heparin  level.  Goal of Therapy:  Heparin  level 0.3-0.7 units/ml aPTT 66-102 seconds Monitor platelets by anticoagulation protocol: Yes  1/1 @ 1518: aPTT 43, HL 0.12 = SUBtherapeutic at 1050 un/hr 1/2 @ 0022: HL = < 0.1, SUBtherapeutic 1/2 @  9156: HL = < 0.1, SUBtherapeutic 1/2 @ 1959: HL = < 0.1, SUBtherapeutic 1/3 @ 0706: HL = 0.26, SUBtherapeutic 1/3 @ 1628: HL = 0.16, SUBtherapeutic 1/3 @ 0015: HL = 0.37, therapeutic X 1  1/4 @ 1307: HL = 0.36, therapeutic x 2 1/5 @ 0534: HL = 0.17, SUBtherapeutic  1/5 @ 1311: HL = 0.37, therapeutic x 1 1/6 @ 0819: HL = 0.20, subtherapeutic   Plan:  HL subtherapeutic Give heparin  1000 units IV x 1 Increase heparin  infusion to 2500 units/hr Recheck HL in 6 hours to confirm Daily CBC while on heparin   Kayla JULIANNA Blew, PharmD, BCPS [facility] 08/16/2024 8:47 AM            [1]  Allergies Allergen Reactions   Cefepime  Hives    Had allergic reaction to one of these 3 agents, unclear which one   Metoprolol  Hives    Had allergic reaction to one of these 3 agents, unclear which one  *Per RN, highly likely this is the cause of allergic rxn    Myrbetriq  [Mirabegron ] Hives    Had allergic reaction to one of these 3 agents, unclear which one

## 2024-08-16 NOTE — Anesthesia Preprocedure Evaluation (Signed)
 "                                  Anesthesia Evaluation  Patient identified by MRN, date of birth, ID band Patient awake    Reviewed: Allergy & Precautions, NPO status , Patient's Chart, lab work & pertinent test results  Airway Mallampati: II  TM Distance: >3 FB Neck ROM: Full    Dental  (+) Edentulous Upper, Edentulous Lower   Pulmonary asthma , sleep apnea , pneumonia, resolved, COPD, Current Smoker and Patient abstained from smoking. 41 pack year history    Pulmonary exam normal breath sounds clear to auscultation       Cardiovascular hypertension, pulmonary hypertension (moderate)+ CAD (non-obstructive), + Peripheral Vascular Disease (gangrene lower extremity) and +CHF (non-ischemic cardiomyopathyLVEF 30-35% with global hypokinesis)  Normal cardiovascular exam+ dysrhythmias (paroxysmal on eliquis  and amiodarone ) Atrial Fibrillation  Rhythm:Regular Rate:Normal  LHC 08/17/23:   Mid LAD lesion is 50% stenosed.   Ost LM lesion is 30% stenosed.   There is moderate to severe left ventricular systolic dysfunction.   LV end diastolic pressure is mildly elevated.   There is no aortic valve stenosis.   Anticipated discharge date to be determined.   Continue heparin .  Defer continuation of aspirin  as well as transition back to apixaban  to vascular surgery/internal medicine.   Conclusions: 1. Mild-moderate, nonobstructive coronary artery disease with 30% ostial LMCA and 50% mid LAD stenoses that are not hemodynamically significant (RFR 0.92). 2. Mildly elevated left heart filling pressures (PCWP 20, LVEDP 18 mmHg). 3. Upper normal right heart filling pressure (mean RA 5, RV EDP 6 mmHg). 4. Moderate pulmonary hypertension (PA 60/22, mean 35 mmHg; PVR 3 WU). 5. Normal Fick cardiac output/index (CO 5.0 L/min, CI 2.7 L/min/m).    Echo 2020  1. Left ventricular ejection fraction, by visual estimation, is 60 to  65%. The left ventricle has normal function. Normal left  ventricular size.  There is moderately increased left ventricular hypertrophy.   2. Elevated left ventricular end-diastolic pressure.   3. Left ventricular diastolic Doppler parameters are consistent with  impaired relaxation pattern of LV diastolic filling.   4. Global right ventricle has normal systolic function.The right  ventricular size is normal. No increase in right ventricular wall  thickness.   5. Left atrial size was normal.   6. Right atrial size was normal.   7. Mild mitral annular calcification.   8. The mitral valve is grossly normal. Trace mitral valve regurgitation.   9. The tricuspid valve is grossly normal. Tricuspid valve regurgitation  is trivial.  10. The aortic valve is grossly normal Aortic valve regurgitation was not  visualized by color flow Doppler. Mild aortic valve sclerosis without  stenosis.  11. The pulmonic valve was grossly normal. Pulmonic valve regurgitation is  not visualized by color flow Doppler.  12. The interatrial septum was not well visualized.     Neuro/Psych  PSYCHIATRIC DISORDERS Anxiety      Neuromuscular disease (peripheral neuropathy 2/2 T2DM)    GI/Hepatic Neg liver ROS,GERD  Controlled and Medicated,,  Endo/Other  diabetes, Poorly Controlled, Type 2, Insulin  Dependent  A1c 11.2  Renal/GU negative Renal ROS  negative genitourinary   Musculoskeletal L great toe gangrene   Abdominal   Peds  Hematology  (+) Blood dyscrasia, anemia Hb 12.7, plt 196   Anesthesia Other Findings  Recommendation was made for left common femoral endarterectomy and possible femoropopliteal  bypass.    Per cardiology 08/17/23: Right and left heart catheterization today showed nonobstructive CAD, moderately to severely reduced left ventricular systolic function (LVEF 30-35% with global hypokinesis) with normal cardiac output, mildly elevated left heart filling pressures, and moderate pulmonary hypertension.  The patient is scheduled for common femoral  endarterectomy tomorrow and possible femoropopliteal bypass.  He is at above average risk for perioperative cardiovascular complications given his comorbidities and the high risk nature of vascular surgery.  With common femoral endarterectomy alone, his risk of cardiac complication is approximately 4% and any serious complication is 25.7% based on the ACS surgical risk calculator.  I will escalate his diuresis tonight.  Mr. Latona is otherwise optimized from a cardiac standpoint; additional cardiac interventions are unlikely to further mitigate his perioperative risk.  Reproductive/Obstetrics negative OB ROS                              Anesthesia Physical Anesthesia Plan  ASA: 3  Anesthesia Plan: General   Post-op Pain Management: Tylenol  PO (pre-op)* and Ketamine IV*   Induction: Intravenous  PONV Risk Score and Plan: 1 and Ondansetron   Airway Management Planned: Oral ETT  Additional Equipment: Arterial line  Intra-op Plan:   Post-operative Plan: Extubation in OR  Informed Consent: I have reviewed the patients History and Physical, chart, labs and discussed the procedure including the risks, benefits and alternatives for the proposed anesthesia with the patient or authorized representative who has indicated his/her understanding and acceptance.     Dental advisory given  Plan Discussed with: CRNA  Anesthesia Plan Comments:          Anesthesia Quick Evaluation  "

## 2024-08-16 NOTE — Plan of Care (Signed)
  Problem: Education: Goal: Knowledge of General Education information will improve Description: Including pain rating scale, medication(s)/side effects and non-pharmacologic comfort measures Outcome: Progressing   Problem: Clinical Measurements: Goal: Respiratory complications will improve Outcome: Progressing   Problem: Clinical Measurements: Goal: Cardiovascular complication will be avoided Outcome: Progressing   Problem: Activity: Goal: Risk for activity intolerance will decrease Outcome: Progressing   Problem: Elimination: Goal: Will not experience complications related to bowel motility Outcome: Progressing   Problem: Elimination: Goal: Will not experience complications related to urinary retention Outcome: Progressing   Problem: Pain Managment: Goal: General experience of comfort will improve and/or be controlled Outcome: Progressing   Problem: Safety: Goal: Ability to remain free from injury will improve Outcome: Progressing

## 2024-08-16 NOTE — Plan of Care (Signed)

## 2024-08-16 NOTE — Plan of Care (Signed)
  Problem: Health Behavior/Discharge Planning: Goal: Ability to manage health-related needs will improve Outcome: Progressing   Problem: Clinical Measurements: Goal: Will remain free from infection Outcome: Progressing   Problem: Clinical Measurements: Goal: Diagnostic test results will improve Outcome: Progressing   Problem: Clinical Measurements: Goal: Respiratory complications will improve Outcome: Progressing   Problem: Clinical Measurements: Goal: Cardiovascular complication will be avoided Outcome: Progressing   Problem: Nutrition: Goal: Adequate nutrition will be maintained Outcome: Progressing

## 2024-08-16 NOTE — Progress Notes (Signed)
 PHARMACY - ANTICOAGULATION CONSULT NOTE  Pharmacy Consult for UFH Indication: atrial fibrillation  Allergies[1]  Patient Measurements: Height: 5' 11 (180.3 cm) Weight: 68 kg (149 lb 14.6 oz) IBW/kg (Calculated) : 75.3 HEPARIN  DW (KG): 68  Labs: Recent Labs    08/14/24 0538 08/14/24 1307 08/15/24 0534 08/15/24 1311 08/16/24 0436 08/16/24 0819 08/16/24 1455 08/16/24 1457  HGB 12.2*  --  12.4*  --  12.6*  --  12.2* 11.9*  HCT 35.2*  --  36.2*  --  36.2*  --  36.0* 35.0*  PLT 314  --  330  --  386  --   --   --   HEPARINUNFRC 0.28*   < > 0.17* 0.37  --  0.20*  --   --   CREATININE 0.91  --  0.96  --   --   --   --   --    < > = values in this interval not displayed.    Estimated Creatinine Clearance: 71.8 mL/min (by C-G formula based on SCr of 0.96 mg/dL).   Medical History: Past Medical History:  Diagnosis Date   Adenomatous colon polyp    Allergic rhinitis    At risk for sleep apnea    STOP-BANG= 5   SENT TO PCP 03-14-2014   Bladder cancer (HCC)    CAD (coronary artery disease)    a. 07/2014 low risk MV; b. 07/2019 Cor CTA (FFR): LM 25-49 (nl), LAD mild prox/mid plaque (nl), D1 25-49p(nl w/ abnl FFR of 0.73 in inf branch), LCX/OM1 mild prox/mid plaque (nl), RCA nondominant, minimal Ca2+ plaque (nl), RPDA (mildly abnl @ 0.79)-->Med Rx..   Cataract    surgically removed bilateral   Condyloma acuminatum of penis    Diabetic neuropathy (HCC)    Diastolic dysfunction    a. 05/2019 Echo: EF 60-65%, no rwam, mod LVH, impaired relaxation, nl RV size/fxn, trace MR, triv TR.   Diverticulosis    GERD (gastroesophageal reflux disease)    History of bladder cancer    s/p  turbt  2013/   transitional cell carcinoma--    History of condyloma acuminatum    PERINEAL AREA  W/ RECURRENCY   History of gout    Hyperlipidemia    Hypertension    Hypoxemia 03/09/2024   Lower urinary tract symptoms (LUTS)    PAF (paroxysmal atrial fibrillation) (HCC)    a. 06/2019 Event monitor:  PAF <1% burden. Longest 3 mins 36 secs.   Productive cough    PSVT (paroxysmal supraventricular tachycardia)    a. 06/2019 Event monitor: 112 episodes of SVT, longest 21 secs.   PVD (peripheral vascular disease) with claudication    a. 03/2014 LE art duplex: long segment occlusion of mid to distal R SFA; b. 03/2020 ABI: nl left and mildly improved R ABI->med rx.   Renal artery stenosis    Renal artery stenosis    a. 12/2020 Renal art duplex: RRA 1-59%, LRA >60%.   Smokers' cough (HCC)    Type 2 diabetes mellitus with insulin  therapy (HCC) 1992   monitor by  dr ellsion   Wears dentures    upper   Assessment: 69 y/o M admitted with LE cellulitis and possible osteomyelitis on apixaban  PTA for AF with last dose yesterday. Pharmacy consulted to initiate heparin  bridge therapy.  Update 1/1 after baseline labs -- baseline aPTT and HL both correlating. Will titrate by heparin  level.  1/6: S/p 1/5 angiogram. Heparin  held at 1359 due to cardiac catheterization today with  plan to resume heparin  2 hours after TR band removal. Confirmed with RN on TR band removal at 1645.   Goal of Therapy:  Heparin  level 0.3-0.7 units/ml aPTT 66-102 seconds Monitor platelets by anticoagulation protocol: Yes  1/1 @ 1518: aPTT 43, HL 0.12 = SUBtherapeutic at 1050 un/hr 1/2 @ 0022: HL = < 0.1, SUBtherapeutic 1/2 @ 0843: HL = < 0.1, SUBtherapeutic 1/2 @ 1959: HL = < 0.1, SUBtherapeutic 1/3 @ 0706: HL = 0.26, SUBtherapeutic 1/3 @ 1628: HL = 0.16, SUBtherapeutic 1/3 @ 0015: HL = 0.37, therapeutic X 1  1/4 @ 1307: HL = 0.36, therapeutic x 2 1/5 @ 0534: HL = 0.17, SUBtherapeutic  1/5 @ 1311: HL = 0.37, therapeutic x 1 1/6 @ 0819: HL = 0.20, subtherapeutic  --heparin  infusion increased to 2500 units/hr   Plan:  Resume heparin  infusion at 2500 units/hr Recheck HL in 6 hours to  Daily CBC while on heparin  Per cards, resume DOAC before discharge  [name], PharmD [name] - [facility] 08/16/2024 5:35  PM             [1]  Allergies Allergen Reactions   Cefepime  Hives    Had allergic reaction to one of these 3 agents, unclear which one   Metoprolol  Hives    Had allergic reaction to one of these 3 agents, unclear which one  *Per RN, highly likely this is the cause of allergic rxn   Myrbetriq  [Mirabegron ] Hives    Had allergic reaction to one of these 3 agents, unclear which one

## 2024-08-16 NOTE — Progress Notes (Signed)
 [facility]  Preop Assessment  Right and left heart catheterization today showed nonobstructive CAD, moderately to severely reduced left ventricular systolic function (LVEF 30-35% with global hypokinesis) with normal cardiac output, mildly elevated left heart filling pressures, and moderate pulmonary hypertension.  The patient is scheduled for common femoral endarterectomy tomorrow and possible femoropopliteal bypass.  He is at above average risk for perioperative cardiovascular complications given his comorbidities and the high risk nature of vascular surgery.  With common femoral endarterectomy alone, his risk of cardiac complication is approximately 4% and any serious complication is 25.7% based on the ACS surgical risk calculator.  I will escalate his diuresis tonight.  [name] is otherwise optimized from a cardiac standpoint; additional cardiac interventions are unlikely to further mitigate his perioperative risk.   Lonni Hanson, MD [facility]

## 2024-08-16 NOTE — Interval H&P Note (Signed)
 History and Physical Interval Note:  08/16/2024 2:37 PM  JOURDEN GILSON  has presented today for surgery, with the diagnosis of acute heart failure with reduced ejection fraction.  The various methods of treatment have been discussed with the patient and family. After consideration of risks, benefits and other options for treatment, the patient has consented to  Procedures: RIGHT/LEFT HEART CATH AND CORONARY ANGIOGRAPHY (N/A) as a surgical intervention.  The patient's history has been reviewed, patient examined, no change in status, stable for surgery.  I have reviewed the patient's chart and labs.  Questions were answered to the patient's satisfaction.    Cath Lab Visit (complete for each Cath Lab visit)  Clinical Evaluation Leading to the Procedure:   ACS: No.  Non-ACS:    Anginal Classification: CCS IV  Anti-ischemic medical therapy: No Therapy  Non-Invasive Test Results: LVEF 30-35% by echo -> high risk  Prior CABG: No previous CABG  Nadra Hritz

## 2024-08-16 NOTE — TOC Progression Note (Signed)
 Transition of Care [facility]) - Progression Note    Patient Details  Name: [name] MRN: [id number] Date of Birth: Apr 09, 1956  [facility] CM/SW Contact  Daved JONETTA Hamilton, RN Phone Number: 08/16/2024, 3:11 PM  Clinical Narrative:     This CM went to patient's room to discuss power situation at his home, patient is off the floor for procedure.                     Expected Discharge Plan and Services                                               Social Drivers of Health (SDOH) Interventions SDOH Screenings   Food Insecurity: Food Insecurity Present (08/11/2024)  Housing: High Risk (08/11/2024)  Transportation Needs: Unmet Transportation Needs (08/11/2024)  Utilities: At Risk (08/15/2024)  Alcohol Screen: Low Risk (06/10/2023)  Depression (PHQ2-9): Low Risk (07/05/2024)  Financial Resource Strain: High Risk (03/11/2024)  Physical Activity: Inactive (06/10/2023)  Social Connections: Socially Isolated (08/11/2024)  Stress: Stress Concern Present (03/11/2024)  Tobacco Use: High Risk (08/10/2024)  Health Literacy: Inadequate Health Literacy (02/17/2024)    Readmission Risk Interventions    03/30/2024    2:55 PM 03/09/2024    3:21 PM 12/30/2023    1:17 PM  Readmission Risk Prevention Plan  Post Dischage Appt   Complete  Medication Screening   Complete  Transportation Screening Complete Complete Complete  PCP or Specialist Appt within 5-7 Days  Complete   PCP or Specialist Appt within 3-5 Days Complete    Home Care Screening  Complete   Medication Review (RN CM)  Complete   HRI or Home Care Consult Complete    Social Work Consult for Recovery Care Planning/Counseling Complete    Palliative Care Screening Complete    Medication Review Oceanographer) Referral to Pharmacy

## 2024-08-16 NOTE — H&P (View-Only) (Signed)
 " Progress Note    08/16/2024 12:10 PM 1 Day Post-Op  Subjective:  [name] is a 69 yo male now POD #1 from:  Date of Surgery: 08/15/2024   Surgeon(s):DEW,JASON     Assistants:none   Pre-operative Diagnosis: PAD with ulceration LLE   Post-operative diagnosis:  Same   Procedure(s) Performed:             1.  Ultrasound guidance for vascular access right femoral artery             2.  Catheter placement into left common femoral artery from right femoral approach             3.  Aortogram and selective left lower extremity angiogram             4.  StarClose closure device right femoral artery   EBL: 5 cc   Contrast: 40 cc   Fluoro Time: 1 minute   Moderate Conscious Sedation Time: approximately 24 minutes using 2 mg of Versed  and 50 mcg of Fentanyl   Patient is resting comfortably in bed this morning.    Vitals:   08/16/24 0724 08/16/24 0900  BP: (!) 153/65 (!) 168/86  Pulse: 65 74  Resp: 16 17  Temp:    SpO2: 97% 98%   Physical Exam: Cardiac:  RRR, normal S1 and S2.  No murmurs appreciated. Lungs: Nonlabored breathing, lungs rhonchorous throughout on auscultation.  Patient has a long history of smoking.  No rales or wheezing noted this morning. Incisions: None Extremities: All extremities are warm to touch with palpable pulses except left lower extremity with ulcerations to his left foot.  Bandages in place for left foot care via podiatry. Abdomen: Positive bowel sounds throughout, soft, nontender nondistended. Neurologic: Alert and oriented x 3, answers all questions and follows commands appropriately.  CBC    Component Value Date/Time   WBC 10.7 (H) 08/16/2024 0436   RBC 3.90 (L) 08/16/2024 0436   HGB 12.6 (L) 08/16/2024 0436   HCT 36.2 (L) 08/16/2024 0436   PLT 386 08/16/2024 0436   MCV 92.8 08/16/2024 0436   MCH 32.3 08/16/2024 0436   MCHC 34.8 08/16/2024 0436   RDW 13.7 08/16/2024 0436   LYMPHSABS 1.7 08/10/2024 2119   MONOABS 1.0 08/10/2024 2119    EOSABS 0.2 08/10/2024 2119   BASOSABS 0.1 08/10/2024 2119    BMET    Component Value Date/Time   NA 135 08/13/2024 0706   NA 138 06/16/2019 1144   K 3.7 08/13/2024 0706   CL 98 08/13/2024 0706   CO2 29 08/13/2024 0706   GLUCOSE 232 (H) 08/13/2024 0706   BUN 15 08/13/2024 0706   BUN 15 06/16/2019 1144   CREATININE 0.96 08/15/2024 0534   CALCIUM  8.6 (L) 08/13/2024 0706   GFRNONAA >60 08/15/2024 0534   GFRAA >60 02/29/2020 0937    INR    Component Value Date/Time   INR 1.1 08/10/2024 2119     Intake/Output Summary (Last 24 hours) at 08/16/2024 1210 Last data filed at 08/16/2024 0547 Gross per 24 hour  Intake 568.32 ml  Output 750 ml  Net -181.68 ml     Assessment/Plan:  69 y.o. male is s/p SEE ABOVE  1 Day Post-Op   PLAN Patient to undergo cardiac catheterization later today.  This will help with cardiac clearance for surgery tomorrow.  Vascular surgery plans on taking the patient to the operating room tomorrow for a left lower extremity femoral endarterectomy with femoral artery to distal  popliteal bypass.  I discussed in detail with the patient again at the bedside this morning the procedure tomorrow.  We also discussed the risks, complications and the benefits of doing surgery.  He verbalizes understanding and agrees to proceed.  Patient will be made n.p.o. after midnight tonight for surgery tomorrow.  Patient was on a heparin  infusion prior to cardiac catheterization today.  Per vascular surgery okay to restart heparin  to be on-call to the perioperative area tomorrow for procedure as soon as cardiology agrees on the timing of the restart of the heparin  infusion post his cardiac catheterization today.  I discussed in detail the case with Dr. Selinda Gu and he agrees with the plan.  DVT prophylaxis: Heparin  infusion   [name] [facility] 08/16/2024 12:10 PM   "

## 2024-08-16 NOTE — Progress Notes (Signed)
 PT Cancellation Note  Patient Details Name: [name] MRN: [id number] DOB: Dec 10, 1955   Cancelled Treatment:    Reason Eval/Treat Not Completed: Patient at procedure or test/unavailable (Chart reviewed for planned treatment session.  Patient currently off unit for cardiac cath; will re-attempt at later time/date as medically appropriate and available.)   [name] H. Delores, PT, DPT, NCS 08/16/2024, 2:14 PM [phone]

## 2024-08-16 NOTE — Progress Notes (Addendum)
 Post Fall Vital Signs. Nurse notified

## 2024-08-16 NOTE — Progress Notes (Signed)
 Patient had assisted fall when up to bedside commode. No injuries reported from patient, vital signs taken. Patient transferred back to bed. Bed alarm on and call bell within reach. MD notified.

## 2024-08-16 NOTE — Care Management Important Message (Signed)
 Important Message  Patient Details  Name: [name] MRN: [id number] Date of Birth: Aug 16, 1955   Important Message Given:  Yes - Medicare IM     Kaoru Rezendes W, CMA 08/16/2024, 1:07 PM

## 2024-08-16 NOTE — Consult Note (Signed)
 "  Cardiology Consultation:   Patient ID: [name]; [id number]; 03-25-1956   Admit date: 08/10/2024 Date of Consult: 08/16/2024  Primary Care Provider: Norleen Lynwood LELON, MD Primary Cardiologist: Darron Primary Electrophysiologist:  None   Patient Profile:   [name] is a 69 y.o. male with a hx of nonobstructive CAD by coronary CTA in 2020, HFrEF, PAF, PAD status post multiple interventions complicated by osteomyelitis status post toe amputation, insulin -dependent diabetes mellitus diagnosed in 1992, refractory HTN, HLD, bladder cancer, and ongoing tobacco use  who is being seen today for preoperative cardiac risk stratification for femoral endarterectomy and transmetatarsal amputation at the request of Dr. Josette, MD.  History of Present Illness:   [name] has a long history of PAD, undergoing multiple interventions over the years dating back to at least 2015.  In late 2015 he had atypical chest pain with nuclear stress test showing a small inferolateral infarct with no significant ischemia and was managed medically given chest pain resolved.  He was admitted in 05/2019 with sepsis secondary to UTI and pyelonephritis as well as hypertensive urgency.  Troponin was mildly elevated at 741.  CTA of the chest was negative for PE, though he was noted to have coronary artery calcification.  Echo during the admission showed an EF of 60 to 65%, moderate LVH, and no significant valvular abnormalities.  During his admission he was noted to have short runs of SVT in the setting of electrolyte abnormalities.  Subsequent coronary CTA in 07/2019 showed a calcium  score 412 with mild left main stenosis and mild first diagonal stenosis that were not significant by ctFFR.  Zio patch in 2020 showed normal sinus rhythm with an average rate of 76 bpm with frequent short runs of SVT as well as PAF with a burden of less than 1% with the longest episode lasting 3 minutes.  Burden of A-fib was not felt to justify  initiation of anticoagulation at that time.  In the setting of known refractory hypertension renal artery duplex was done in 2022 which was overall suboptimal and showed no significant right renal artery stenosis with borderline stenosis affecting the left renal artery.   He was seen in the ER in early 07/2024 with shortness of breath felt to be related to volume overload as well as recent mechanical fall.  BNP 801, high-sensitivity troponin 97 with a delta troponin 114.  Chest x-ray with left pleural effusion and acute left seventh lateral rib fracture without evidence of pneumothorax.  He was prescribed furosemide  20 mg daily.   He was admitted to outside hospital on 07/21/2024 through 07/29/2024 with sepsis due to right lower lobe pneumonia with admission complicated by COPD exacerbation, CHF exacerbation, A-fib with RVR, and electrolyte derangement.  Echo during admission demonstrated an EF of 30 to 35%.  He was initiated on GDMT including Toprol -XL, Entresto, and spironolactone .  During the admission, he developed A-fib with RVR and was placed on IV amiodarone  with subsequent transition to oral amiodarone  and initiated on apixaban .   He was admitted to the hospital on 08/10/2024 with cellulitis of the left foot complicated by gangrene initially treated with IV vancomycin  and Zosyn  with wound culture growing Staph aureus, Morganella, and Enterococcus.  He has been followed by internal medicine, podiatry, and vascular surgery.  MRI showed no evidence of osteomyelitis.  He underwent lower extremity angiogram on 08/15/2024 which demonstrated a napkin ring-like lesion in the distal left common femoral artery just above the femoral bifurcation associated with severe  calcific disease in the more proximal to mid left common femoral artery of greater than 80% stenosis.  Vascular surgery has recommended femoral endarterectomy with possible bypass and transmetatarsal amputation and request cardiac risk stratification  prior to procedure.   At the time of cardiology consult he is without symptoms of angina or cardiac decompensation but he reports a several week history of exertional chest pain and dyspnea with associated palpitations as well as several episodes of unsteady gait leading to mechanical falls.  He denies frank syncope.  Difficult to establish functional status due to significant comorbidities and underlying symptoms.  Continues to smoke 1 to 2 packs of cigarettes daily.  DASI: Difficult to assess secondary to comorbidities RCRI: Perioperative risk of major cardiac event is 11%    Past Medical History:  Diagnosis Date   Adenomatous colon polyp    Allergic rhinitis    At risk for sleep apnea    STOP-BANG= 5   SENT TO PCP 03-14-2014   Bladder cancer (HCC)    CAD (coronary artery disease)    a. 07/2014 low risk MV; b. 07/2019 Cor CTA (FFR): LM 25-49 (nl), LAD mild prox/mid plaque (nl), D1 25-49p(nl w/ abnl FFR of 0.73 in inf branch), LCX/OM1 mild prox/mid plaque (nl), RCA nondominant, minimal Ca2+ plaque (nl), RPDA (mildly abnl @ 0.79)-->Med Rx..   Cataract    surgically removed bilateral   Condyloma acuminatum of penis    Diabetic neuropathy (HCC)    Diastolic dysfunction    a. 05/2019 Echo: EF 60-65%, no rwam, mod LVH, impaired relaxation, nl RV size/fxn, trace MR, triv TR.   Diverticulosis    GERD (gastroesophageal reflux disease)    History of bladder cancer    s/p  turbt  2013/   transitional cell carcinoma--    History of condyloma acuminatum    PERINEAL AREA  W/ RECURRENCY   History of gout    Hyperlipidemia    Hypertension    Hypoxemia 03/09/2024   Lower urinary tract symptoms (LUTS)    PAF (paroxysmal atrial fibrillation) (HCC)    a. 06/2019 Event monitor: PAF <1% burden. Longest 3 mins 36 secs.   Productive cough    PSVT (paroxysmal supraventricular tachycardia)    a. 06/2019 Event monitor: 112 episodes of SVT, longest 21 secs.   PVD (peripheral vascular disease) with  claudication    a. 03/2014 LE art duplex: long segment occlusion of mid to distal R SFA; b. 03/2020 ABI: nl left and mildly improved R ABI->med rx.   Renal artery stenosis    Renal artery stenosis    a. 12/2020 Renal art duplex: RRA 1-59%, LRA >60%.   Smokers' cough (HCC)    Type 2 diabetes mellitus with insulin  therapy [facility]) 1992   monitor by  dr ellsion   Wears dentures    upper    Past Surgical History:  Procedure Laterality Date   ABDOMINAL AORTOGRAM W/LOWER EXTREMITY N/A 02/15/2024   Procedure: ABDOMINAL AORTOGRAM W/LOWER EXTREMITY;  Surgeon: Pearline Norman RAMAN, MD;  Location: [facility] INVASIVE CV LAB;  Service: Cardiovascular;  Laterality: N/A;   ABDOMINAL AORTOGRAM W/LOWER EXTREMITY N/A 08/15/2024   Procedure: ABDOMINAL AORTOGRAM W/LOWER EXTREMITY;  Surgeon: Marea Selinda RAMAN, MD;  Location: [facility] INVASIVE CV LAB;  Service: Cardiovascular;  Laterality: N/A;   AMPUTATION Left 01/02/2024   Procedure: AMPUTATION, FOOT, RAY;  Surgeon: Malvin Marsa FALCON, DPM;  Location: [facility] OR;  Service: Orthopedics/Podiatry;  Laterality: Left;  FIFTH TOE   AMPUTATION TOE Left 02/29/2024   Procedure:  AMPUTATION, LEFT GREAT TOE;  Surgeon: Malvin Marsa FALCON, DPM;  Location: [facility] OR;  Service: Orthopedics/Podiatry;  Laterality: Left;  Left Great toe partial amputation,  Debridement Left Heel   AMPUTATION TOE Left 03/29/2024   Procedure: AMPUTATION LEFT GREAT TOE;  Surgeon: Malvin Marsa FALCON, DPM;  Location: [facility] OR;  Service: Orthopedics/Podiatry;  Laterality: Left;  LEFT GREAT TOE   AXILLARY HIDRADENITIS EXCISION  1997   CARDIOVASCULAR STRESS TEST  07-24-2014  dr darron grass   Low risk lexiscan  nuclear study with apical thinning and small inferolateral wall infarct at mid & basal level , no ischemia/  normal LVF and wall motion , ef 59%   CATARACT EXTRACTION Left    CATARACT EXTRACTION W/ INTRAOCULAR LENS IMPLANT Right    CO2 LASER APPLICATION N/A 03/20/2014   Procedure: CO2 LASER APPLICATION,PENIS, GROIN,  ANUS;  Surgeon: Elspeth KYM Schultze, MD;  Location: [facility];  Service: General;  Laterality: N/A;   CO2 LASER APPLICATION N/A 05/21/2015   Procedure: CO2 LASER APPLICATION;  Surgeon: Mark Ottelin, MD;  Location: [facility] [place];  Service: Urology;  Laterality: N/A;   CONDYLOMA EXCISION/FULGURATION N/A 05/21/2015   Procedure: CONDYLOMA REMOVAL;  Surgeon: Mark Ottelin, MD;  Location: [facility];  Service: Urology;  Laterality: N/A;   HEMORRHOID SURGERY  10/24/2014   Procedure: HEMORRHOIDECTOMY;  Surgeon: Elspeth Schultze, MD;  Location: [facility];  Service: General;;   INCISION AND DRAINAGE ABSCESS Left 10/24/2014   Procedure: INCISION AND DRAINAGE ABSCESS;  Surgeon: Elspeth Schultze, MD;  Location: [facility] [place];  Service: General;  Laterality: Left;   INCISION AND DRAINAGE OF WOUND Left 03/29/2024   Procedure: IRRIGATION AND DEBRIDEMENT PLANTAR FOOT WOUND;  Surgeon: Malvin Marsa FALCON, DPM;  Location: [facility];  Service: Orthopedics/Podiatry;  Laterality: Left;  LEFT GREAT TOE   INGUINAL HIDRADENITIS EXCISION  1998, 1999   LASER ABLATION CONDOLAMATA N/A 03/20/2014   Procedure: EXAM UNDER ANESTHESIA, REMOVAL/ABLATION OF CONDYLOMATA PENIS,GROINS, ANUS, ANAL CANAL;  Surgeon: Elspeth KYM Schultze, MD;  Location: [facility];  Service: General;  Laterality: N/A;  groin and anus   LASER ABLATION CONDOLAMATA N/A 10/24/2014   Procedure: LASER ABLATION CONDOLAMATA;  Surgeon: Elspeth Schultze, MD;  Location: [facility] [place];  Service: General;  Laterality: N/A;   LASER ABLATION OF PENILE AND PERIANAL WARTS  07-29-2007  Dr. Schultze   LEFT SHOULDER SURGERY  2003   LOWER EXTREMITY ANGIOGRAPHY N/A 01/01/2024   Procedure: Lower Extremity Angiography;  Surgeon: Sheree Penne Bruckner, MD;  Location: [facility] INVASIVE CV LAB;  Service: Vascular;  Laterality: N/A;   LOWER EXTREMITY ANGIOGRAPHY N/A 02/15/2024   Procedure: Lower Extremity  Angiography;  Surgeon: Pearline Norman RAMAN, MD;  Location: [facility] INVASIVE CV LAB;  Service: Cardiovascular;  Laterality: N/A;   LOWER EXTREMITY INTERVENTION  01/01/2024   Procedure: LOWER EXTREMITY INTERVENTION;  Surgeon: Sheree Penne Bruckner, MD;  Location: [facility] INVASIVE CV LAB;  Service: Vascular;;   LOWER EXTREMITY INTERVENTION N/A 02/15/2024   Procedure: LOWER EXTREMITY INTERVENTION;  Surgeon: Pearline Norman RAMAN, MD;  Location: [facility] INVASIVE CV LAB;  Service: Cardiovascular;  Laterality: N/A;   MASS EXCISION N/A 10/24/2014   Procedure: EXCISION OF PERINEAL MASS/SINUS;  Surgeon: Elspeth Schultze, MD;  Location: [facility] [place];  Service: General;  Laterality: N/A;   MOHS SURGERY     back   MOHS SURGERY  2017   face   MULTIPLE TOOTH EXTRACTIONS     PERINEAL HIDRADENITIS EXCISION  1998,  1999   TRANSURETHRAL RESECTION OF BLADDER TUMOR  05/21/2012   Procedure: TRANSURETHRAL RESECTION OF BLADDER TUMOR (TURBT);  Surgeon: Mark C Ottelin, MD;  Location: [facility];  Service: Urology;  Laterality: N/A;        Home Meds: Prior to Admission medications  Medication Sig Start Date End Date Taking? Authorizing Provider  albuterol  (VENTOLIN  HFA) 108 (90 Base) MCG/ACT inhaler Inhale 2 puffs into the lungs every 4 (four) hours as needed for wheezing or shortness of breath. 06/11/24  Yes Ula Prentice SAUNDERS, MD  amiodarone  (PACERONE ) 200 MG tablet Take 200 mg by mouth daily. 07/29/24  Yes [provider]  atorvastatin  (LIPITOR ) 40 MG tablet TAKE 1 TABLET BY MOUTH ONCE DAILY 05/31/24  Yes Paul Lynwood ORN, MD  citalopram  (CELEXA ) 20 MG tablet Take 1 tablet (20 mg total) by mouth daily. 05/31/24  Yes Paul Lynwood ORN, MD  doxycycline  (VIBRA -TABS) 100 MG tablet Take 1 tablet (100 mg total) by mouth 2 (two) times daily. 08/09/24  Yes Paul Lynwood ORN, MD  ELIQUIS  5 MG TABS tablet Take 5 mg by mouth 2 (two) times daily. 07/29/24  Yes [provider]  furosemide  (LASIX ) 40 MG tablet Take 40  mg by mouth daily. 07/29/24  Yes [provider]  insulin  glargine, 2 Unit Dial , (TOUJEO  MAX SOLOSTAR) 300 UNIT/ML Solostar Pen Inject 40 Units into the skin every morning. 05/31/24  Yes Paul Lynwood ORN, MD  irbesartan  (AVAPRO ) 300 MG tablet Take 1 tablet (300 mg total) by mouth daily. 05/31/24  Yes Paul Lynwood ORN, MD  pantoprazole  (PROTONIX ) 40 MG tablet Take 1 tablet (40 mg total) by mouth daily. 05/31/24  Yes Paul Lynwood ORN, MD  Potassium Chloride  ER 20 MEQ TBCR Take 1 tablet by mouth daily. 07/29/24  Yes [provider]  spironolactone  (ALDACTONE ) 25 MG tablet Take 25 mg by mouth daily. 07/29/24  Yes [provider]  Blood Glucose Monitoring Suppl (ACCU-CHEK GUIDE) w/Device KIT 1 Device by Does not apply route daily in the afternoon. 04/05/24   Shamleffer, Donell Cardinal, MD  Continuous Glucose Sensor (DEXCOM G7 SENSOR) MISC Change every 10 days 07/05/24   Shamleffer, Ibtehal Jaralla, MD  furosemide  (LASIX ) 20 MG tablet Take 1 tablet (20 mg total) by mouth daily. Patient not taking: Reported on 08/11/2024 07/13/24   Haze Lonni PARAS, MD  glucose blood (ACCU-CHEK GUIDE TEST) test strip 1 each by Other route 3 (three) times daily. Use as instructed 04/05/24   Shamleffer, Ibtehal Jaralla, MD  potassium chloride  SA (KLOR-CON  M) 20 MEQ tablet Take 2 tablets (40 mEq total) by mouth daily. Patient not taking: Reported on 08/11/2024 07/13/24   Haze Lonni PARAS, MD  predniSONE  (DELTASONE ) 50 MG tablet Take 1 tablet by mouth daily Patient not taking: Reported on 08/11/2024 06/11/24   Ula Prentice SAUNDERS, MD    Inpatient Medications: Scheduled Meds:  amiodarone   200 mg Oral Daily   atorvastatin   40 mg Oral Daily   citalopram   20 mg Oral Daily   furosemide   40 mg Oral Daily   guaiFENesin   600 mg Oral BID   insulin  aspart  0-5 Units Subcutaneous QHS   insulin  aspart  0-6 Units Subcutaneous TID WC   insulin  glargine  20 Units Subcutaneous Daily   irbesartan   300 mg Oral Daily    pantoprazole   40 mg Oral Daily   spironolactone   25 mg Oral Daily   Continuous Infusions:  sodium chloride  10 mL/hr at 08/16/24 0410   heparin  2,400  Units/hr (08/15/24 2259)   piperacillin -tazobactam (ZOSYN )  IV 3.375 g (08/16/24 0411)   PRN Meds: sodium chloride , acetaminophen  **OR** acetaminophen , albuterol , guaiFENesin -dextromethorphan , HYDROcodone -acetaminophen , morphine  injection, ondansetron  **OR** ondansetron  (ZOFRAN ) IV  Allergies:  Allergies[1]  Social History:   Social History   Socioeconomic History   Marital status: Widowed    Spouse name: Not on file   Number of children: 0   Years of education: Not on file   Highest education level: Not on file  Occupational History   Occupation: Disabled/retired  Tobacco Use   Smoking status: Every Day    Current packs/day: 0.00    Average packs/day: 1 pack/day for 41.0 years (41.0 ttl pk-yrs)    Types: Cigars, Cigarettes    Start date: 58    Last attempt to quit: 2017    Years since quitting: 9.0   Smokeless tobacco: Never   Tobacco comments:    Quit cigarettes in 2017 but started back smoking Cigars  Vaping Use   Vaping status: Former  Substance and Sexual Activity   Alcohol use: No    Alcohol/week: 0.0 standard drinks of alcohol   Drug use: No   Sexual activity: Not Currently  Other Topics Concern   Not on file  Social History Narrative   Not on file   Social Drivers of Health   Tobacco Use: High Risk (08/10/2024)   Patient History    Smoking Tobacco Use: Every Day    Smokeless Tobacco Use: Never    Passive Exposure: Not on file  Financial Resource Strain: High Risk (03/11/2024)   Overall Financial Resource Strain (CARDIA)    Difficulty of Paying Living Expenses: Very hard  Food Insecurity: Food Insecurity Present (08/11/2024)   Epic    Worried About Programme Researcher, Broadcasting/film/video in the Last Year: Often true    Ran Out of Food in the Last Year: Often true  Transportation Needs: Unmet Transportation Needs (08/11/2024)    Epic    Lack of Transportation (Medical): Yes    Lack of Transportation (Non-Medical): Yes  Physical Activity: Inactive (06/10/2023)   Exercise Vital Sign    Days of Exercise per Week: 0 days    Minutes of Exercise per Session: 0 min  Stress: Stress Concern Present (03/11/2024)   [employer] of Occupational Health - Occupational Stress Questionnaire    Feeling of Stress: Very much  Social Connections: Socially Isolated (08/11/2024)   Social Connection and Isolation Panel    Frequency of Communication with Friends and Family: More than three times a week    Frequency of Social Gatherings with Friends and Family: Never    Attends Religious Services: Never    Database Administrator or Organizations: No    Attends Banker Meetings: Never    Marital Status: Widowed  Intimate Partner Violence: Not At Risk (08/11/2024)   Epic    Fear of Current or Ex-Partner: No    Emotionally Abused: No    Physically Abused: No    Sexually Abused: No  Depression (PHQ2-9): Low Risk (07/05/2024)   Depression (PHQ2-9)    PHQ-2 Score: 0  Alcohol Screen: Low Risk (06/10/2023)   Alcohol Screen    Last Alcohol Screening Score (AUDIT): 0  Housing: High Risk (08/11/2024)   Epic    Unable to Pay for Housing in the Last Year: Yes    Number of Times Moved in the Last Year: 0    Homeless in the Last Year: No  Utilities: At Risk (08/15/2024)  Epic    Threatened with loss of utilities: Yes  Health Literacy: Inadequate Health Literacy (02/17/2024)   B1300 Health Literacy    Frequency of need for help with medical instructions: Sometimes     Family History:   Family History  Problem Relation Age of Onset   Hypertension Mother    Lung cancer Father    Diabetes Maternal Aunt        x 2   Colon cancer Neg Hx    Esophageal cancer Neg Hx    Pancreatic cancer Neg Hx    Prostate cancer Neg Hx    Kidney disease Neg Hx    Liver disease Neg Hx    Rectal cancer Neg Hx    Stomach cancer Neg Hx      ROS:  Review of Systems  Constitutional:  Positive for malaise/fatigue. Negative for chills, diaphoresis, fever and weight loss.  HENT:  Negative for congestion.   Eyes:  Negative for discharge and redness.  Respiratory:  Positive for shortness of breath. Negative for cough, sputum production and wheezing.   Cardiovascular:  Positive for chest pain, palpitations and claudication. Negative for orthopnea, leg swelling and PND.  Gastrointestinal:  Negative for abdominal pain, heartburn, nausea and vomiting.  Musculoskeletal:  Positive for falls. Negative for myalgias.  Skin:  Negative for rash.  Neurological:  Positive for weakness. Negative for dizziness, tingling, tremors, sensory change, speech change, focal weakness and loss of consciousness.  Endo/Heme/Allergies:  Does not bruise/bleed easily.  Psychiatric/Behavioral:  Negative for substance abuse. The patient is not nervous/anxious.   All other systems reviewed and are negative.     Physical Exam/Data:   Vitals:   08/15/24 1705 08/15/24 2106 08/16/24 0512 08/16/24 0724  BP: (!) 177/83 (!) 157/68 (!) 152/77 (!) 153/65  Pulse: 79 66 61 65  Resp: 17 17 17 16   Temp: 98.4 F (36.9 C) 98.7 F (37.1 C) 98.5 F (36.9 C)   TempSrc: Oral     SpO2: 93% 92% 94% 97%  Weight:      Height:        Intake/Output Summary (Last 24 hours) at 08/16/2024 0804 Last data filed at 08/16/2024 0547 Gross per 24 hour  Intake 568.32 ml  Output 750 ml  Net -181.68 ml   Filed Weights   08/10/24 2055 08/15/24 1430  Weight: 68 kg 68 kg   Body mass index is 20.91 kg/m.   Physical Exam: General: Well developed, well nourished, in no acute distress. Head: Normocephalic, atraumatic, sclera non-icteric, no xanthomas, nares without discharge.  Neck: Negative for carotid bruits. JVD not elevated. Lungs: Clear bilaterally to auscultation without wheezes, rales, or rhonchi. Breathing is unlabored. Heart: RRR with S1 S2. No murmurs, rubs, or gallops  appreciated. Abdomen: Soft, non-tender, non-distended with normoactive bowel sounds. No hepatomegaly. No rebound/guarding. No obvious abdominal masses. Msk:  Strength and tone appear normal for age. Extremities: Left foot bandaged/wrapped. Neuro: Alert and oriented X 3. No facial asymmetry. No focal deficit. Moves all extremities spontaneously. Psych:  Responds to questions appropriately with a normal affect.   EKG:  The EKG was personally reviewed and demonstrates: Not available for review Telemetry:  Telemetry was personally reviewed and demonstrates: Not currently on telemetry   Weights: Filed Weights   08/10/24 2055 08/15/24 1430  Weight: 68 kg 68 kg    Relevant CV Studies: As above  Laboratory Data:  Chemistry Recent Labs  Lab 08/10/24 2119 08/13/24 0706 08/14/24 0538 08/15/24 0534  NA 134* 135  --   --  K 3.5 3.7  --   --   CL 94* 98  --   --   CO2 29 29  --   --   GLUCOSE 141* 232*  --   --   BUN 11 15  --   --   CREATININE 0.84 0.83 0.91 0.96  CALCIUM  9.0 8.6*  --   --   GFRNONAA >60 >60 >60 >60  ANIONGAP 10 8  --   --     Recent Labs  Lab 08/10/24 2119  PROT 7.2  ALBUMIN  3.1*  AST 22  ALT 15  ALKPHOS 143*  BILITOT 0.9   Hematology Recent Labs  Lab 08/14/24 0538 08/15/24 0534 08/16/24 0436  WBC 10.6* 10.7* 10.7*  RBC 3.83* 3.86* 3.90*  HGB 12.2* 12.4* 12.6*  HCT 35.2* 36.2* 36.2*  MCV 91.9 93.8 92.8  MCH 31.9 32.1 32.3  MCHC 34.7 34.3 34.8  RDW 13.8 13.9 13.7  PLT 314 330 386   Cardiac EnzymesNo results for input(s): TROPONINI in the last 168 hours. No results for input(s): TROPIPOC in the last 168 hours.  BNPNo results for input(s): BNP, PROBNP in the last 168 hours.  DDimer No results for input(s): DDIMER in the last 168 hours.  Radiology/Studies:  PERIPHERAL VASCULAR CATHETERIZATION Result Date: 08/15/2024 See surgical note for result.  US  ARTERIAL ABI (SCREENING LOWER EXTREMITY) Result Date: 08/12/2024 IMPRESSION: 1.  Right ABI is 0.59 and compatible with moderate peripheral arterial disease. 2. Left ABI is 0.45 and compatible with severe peripheral arterial disease. Electronically Signed   By: Juliene Balder M.D.   On: 08/12/2024 11:11    Assessment and Plan:   1. Cardiac risk stratification:  - Patient is planning to undergo left common femoral endarterectomy with possible bypass and transmetatarsal amputation in the setting of left lower extremity cellulitis complicated by gangrene  - He reports symptoms of angina and exertional dyspnea over the past several weeks - Per RCRI, perioperative risk of major cardiac event is 11%  - In the context of significant periprocedural risk, symptoms of angina with exertional dyspnea, and recently diagnosed HFrEF we will pursue [facility] later today to further understand his coronary anatomy, volume status, and assess with risk stratification - Obtain echo  2. CAD involving the native coronary arteries with angina:  - Currently without symptoms of angina - Pursue R/LHC as outlined above - On apixaban  in lieu of aspirin  prior to admission  - PTA atorvastatin  40 mg   3. HFrEF:  - Echo during admission at [facility] in 07/2024 with an EF of 30 to 35%, it is unclear if this was taken while in A-fib with RVR - Repeat study to confirm EF - Metoprolol  previously held for bradycardia during admission at outside hospital in 07/2024  - Currently on irbesartan  and spironolactone  -Does not appear grossly volume up, currently on oral furosemide  40 mg daily - Anticipate transitioning ARB to ARNI postprocedure as able, if not precluded by financial constraints - Escalate GDMT as indicated and able  4. PAF:  - Maintaining sinus rhythm by physical exam, place on telemetry and obtain EKG - Noted on prior outpatient cardiac monitor lasting 3 minutes  - At time of last office visit in our office in 2024, he was not maintained on anticoagulation given burden of A-fib did not justify  initiating this  - During admission at outside hospital in 07/2024 ED redeveloped A-fib with RVR in the setting of acute illness and volume overload leading to the initiation  of amiodarone  and apixaban   - Given age, long-term amiodarone  is not a great option  - No longer on metoprolol  due to bradycardia while on beta-blocker - On heparin  drip in place of apixaban  while admitted, resume DOAC prior to discharge  - CHA2DS2-VASc at least 5 (CHF, HTN, age x 1, DM, vascular disease)   5. PAD:  - Management per podiatry and vascular surgery   Refractory hypertension:  - Blood pressure  - Pharmacotherapy as above   6. HLD:  - LDL 42 in 02/2024  - PTA atorvastatin  40 mg    Informed Consent   Shared Decision Making/Informed Consent{  The risks [stroke (1 in 1000), death (1 in 1000), kidney failure [usually temporary] (1 in 500), bleeding (1 in 200), allergic reaction [possibly serious] (1 in 200)], benefits (diagnostic support and management of coronary artery disease) and alternatives of a cardiac catheterization were discussed in detail with Mr. Pullara and he is willing to proceed.       For questions or updates, please contact [facility] Please consult [email or web address] for contact info under Cardiology/STEMI.   Signed, Bernardino Bring, PA-C [facility] Pager: [phone] 08/16/2024, 8:04 AM        [1]  Allergies Allergen Reactions   Cefepime  Hives    Had allergic reaction to one of these 3 agents, unclear which one   Metoprolol  Hives    Had allergic reaction to one of these 3 agents, unclear which one  *Per RN, highly likely this is the cause of allergic rxn   Myrbetriq  [Mirabegron ] Hives    Had allergic reaction to one of these 3 agents, unclear which one   "

## 2024-08-17 ENCOUNTER — Encounter: Admitting: Anesthesiology

## 2024-08-17 ENCOUNTER — Encounter: Admission: EM | Disposition: A | Payer: Self-pay | Source: Home / Self Care | Attending: Internal Medicine

## 2024-08-17 ENCOUNTER — Inpatient Hospital Stay

## 2024-08-17 ENCOUNTER — Inpatient Hospital Stay: Admitting: Anesthesiology

## 2024-08-17 DIAGNOSIS — T82868A Thrombosis of vascular prosthetic devices, implants and grafts, initial encounter: Secondary | ICD-10-CM | POA: Diagnosis not present

## 2024-08-17 DIAGNOSIS — L089 Local infection of the skin and subcutaneous tissue, unspecified: Secondary | ICD-10-CM | POA: Diagnosis not present

## 2024-08-17 DIAGNOSIS — I96 Gangrene, not elsewhere classified: Secondary | ICD-10-CM

## 2024-08-17 DIAGNOSIS — I70245 Atherosclerosis of native arteries of left leg with ulceration of other part of foot: Secondary | ICD-10-CM | POA: Diagnosis not present

## 2024-08-17 DIAGNOSIS — L97529 Non-pressure chronic ulcer of other part of left foot with unspecified severity: Secondary | ICD-10-CM | POA: Diagnosis not present

## 2024-08-17 DIAGNOSIS — Z9889 Other specified postprocedural states: Secondary | ICD-10-CM | POA: Diagnosis not present

## 2024-08-17 DIAGNOSIS — T82858A Stenosis of vascular prosthetic devices, implants and grafts, initial encounter: Secondary | ICD-10-CM

## 2024-08-17 HISTORY — PX: THROMBECTOMY FEMORAL ARTERY: SHX6406

## 2024-08-17 HISTORY — PX: INSERTION OF ILIAC STENT: SHX6256

## 2024-08-17 HISTORY — PX: ENDARTERECTOMY FEMORAL: SHX5804

## 2024-08-17 HISTORY — PX: APPLICATION OF CELL SAVER: SHX7529

## 2024-08-17 LAB — CBC
HCT: 38 % — ABNORMAL LOW (ref 39.0–52.0)
Hemoglobin: 13 g/dL (ref 13.0–17.0)
MCH: 31.9 pg (ref 26.0–34.0)
MCHC: 34.2 g/dL (ref 30.0–36.0)
MCV: 93.4 fL (ref 80.0–100.0)
Platelets: 401 K/uL — ABNORMAL HIGH (ref 150–400)
RBC: 4.07 MIL/uL — ABNORMAL LOW (ref 4.22–5.81)
RDW: 13.5 % (ref 11.5–15.5)
WBC: 11.4 K/uL — ABNORMAL HIGH (ref 4.0–10.5)
nRBC: 0 % (ref 0.0–0.2)

## 2024-08-17 LAB — BASIC METABOLIC PANEL WITH GFR
Anion gap: 14 (ref 5–15)
BUN: 19 mg/dL (ref 8–23)
CO2: 25 mmol/L (ref 22–32)
Calcium: 8.9 mg/dL (ref 8.9–10.3)
Chloride: 99 mmol/L (ref 98–111)
Creatinine, Ser: 1.02 mg/dL (ref 0.61–1.24)
GFR, Estimated: >60 mL/min
Glucose, Bld: 230 mg/dL — ABNORMAL HIGH (ref 70–99)
Potassium: 5 mmol/L (ref 3.5–5.1)
Sodium: 138 mmol/L (ref 135–145)

## 2024-08-17 LAB — HEPARIN LEVEL (UNFRACTIONATED)
Heparin Unfractionated: 0.3 [IU]/mL (ref 0.30–0.70)
Heparin Unfractionated: 0.15 [IU]/mL — ABNORMAL LOW (ref 0.30–0.70)

## 2024-08-17 LAB — GLUCOSE, CAPILLARY
Glucose-Capillary: 275 mg/dL — ABNORMAL HIGH (ref 70–99)
Glucose-Capillary: 282 mg/dL — ABNORMAL HIGH (ref 70–99)
Glucose-Capillary: 171 mg/dL — ABNORMAL HIGH (ref 70–99)
Glucose-Capillary: 176 mg/dL — ABNORMAL HIGH (ref 70–99)
Glucose-Capillary: 207 mg/dL — ABNORMAL HIGH (ref 70–99)
Glucose-Capillary: 184 mg/dL — ABNORMAL HIGH (ref 70–99)
Glucose-Capillary: 297 mg/dL — ABNORMAL HIGH (ref 70–99)

## 2024-08-17 LAB — MRSA NEXT GEN BY PCR, NASAL: MRSA by PCR Next Gen: NOT DETECTED

## 2024-08-17 MED ORDER — OXYCODONE HCL 5 MG/5ML PO SOLN: 5.0000 mg | Freq: Once | ORAL | Status: DC | PRN

## 2024-08-17 MED ORDER — EPHEDRINE 5 MG/ML INJ
INTRAVENOUS | Status: AC
  Filled 2024-08-17: qty 5

## 2024-08-17 MED ORDER — PROPOFOL 10 MG/ML IV BOLUS
INTRAVENOUS | Status: DC | PRN
  Administered 2024-08-17: 140 mg via INTRAVENOUS

## 2024-08-17 MED ORDER — VANCOMYCIN HCL IN DEXTROSE 1-5 GM/200ML-% IV SOLN
1000.0000 mg | INTRAVENOUS | Status: AC
  Administered 2024-08-17: 1000 mg via INTRAVENOUS

## 2024-08-17 MED ORDER — GLYCOPYRROLATE 0.2 MG/ML IJ SOLN
INTRAMUSCULAR | Status: AC
  Filled 2024-08-17: qty 1

## 2024-08-17 MED ORDER — MIDAZOLAM HCL 2 MG/2ML IJ SOLN
INTRAMUSCULAR | Status: AC
  Filled 2024-08-17: qty 2

## 2024-08-17 MED ORDER — ONDANSETRON HCL 4 MG/2ML IJ SOLN
INTRAMUSCULAR | Status: DC | PRN
  Administered 2024-08-17: 4 mg via INTRAVENOUS

## 2024-08-17 MED ORDER — VASHE WOUND IRRIGATION OPTIME
TOPICAL | Status: DC | PRN
  Administered 2024-08-17: 34 [oz_av]

## 2024-08-17 MED ORDER — SODIUM CHLORIDE 0.9 % IV SOLN: INTRAVENOUS | Status: DC

## 2024-08-17 MED ORDER — HEMOSTATIC AGENTS (NO CHARGE) OPTIME
TOPICAL | Status: DC | PRN
  Administered 2024-08-17 (×2): 1 via TOPICAL

## 2024-08-17 MED ORDER — HEPARIN SODIUM (PORCINE) 5000 UNIT/ML IJ SOLN
INTRAMUSCULAR | Status: AC
  Filled 2024-08-17: qty 1

## 2024-08-17 MED ORDER — FENTANYL CITRATE (PF) 100 MCG/2ML IJ SOLN
INTRAMUSCULAR | Status: AC
  Filled 2024-08-17: qty 2

## 2024-08-17 MED ORDER — VANCOMYCIN HCL 1000 MG IV SOLR
INTRAVENOUS | Status: AC
  Filled 2024-08-17: qty 20

## 2024-08-17 MED ORDER — LIDOCAINE HCL (PF) 2 % IJ SOLN
INTRAMUSCULAR | Status: AC
  Filled 2024-08-17: qty 5

## 2024-08-17 MED ORDER — VANCOMYCIN HCL IN DEXTROSE 1-5 GM/200ML-% IV SOLN
INTRAVENOUS | Status: AC
  Filled 2024-08-17: qty 200

## 2024-08-17 MED ORDER — PHENYLEPHRINE HCL-NACL 20-0.9 MG/250ML-% IV SOLN
INTRAVENOUS | Status: DC | PRN
  Administered 2024-08-17: 30 ug/min via INTRAVENOUS

## 2024-08-17 MED ORDER — ATORVASTATIN CALCIUM 10 MG PO TABS
10.0000 mg | ORAL_TABLET | Freq: Every day | ORAL | Status: DC
  Administered 2024-08-18 – 2024-08-22 (×5): 10 mg via ORAL
  Filled 2024-08-17 (×5): qty 1

## 2024-08-17 MED ORDER — STERILE WATER FOR IRRIGATION IR SOLN
Status: DC | PRN
  Administered 2024-08-17: 1000 mL

## 2024-08-17 MED ORDER — OXYCODONE HCL 5 MG PO TABS: 5.0000 mg | ORAL_TABLET | Freq: Once | ORAL | Status: DC | PRN

## 2024-08-17 MED ORDER — SODIUM CHLORIDE 0.9 % IV SOLN: INTRAVENOUS | Status: DC | PRN

## 2024-08-17 MED ORDER — ROCURONIUM BROMIDE 10 MG/ML (PF) SYRINGE
PREFILLED_SYRINGE | INTRAVENOUS | Status: AC
  Filled 2024-08-17: qty 10

## 2024-08-17 MED ORDER — CHLORHEXIDINE GLUCONATE 4 % EX SOLN: 60.0000 mL | Freq: Once | CUTANEOUS | Status: DC

## 2024-08-17 MED ORDER — DROPERIDOL 2.5 MG/ML IJ SOLN: 0.6250 mg | Freq: Once | INTRAMUSCULAR | Status: DC | PRN

## 2024-08-17 MED ORDER — INSULIN ASPART 100 UNIT/ML IJ SOLN
10.0000 [IU] | Freq: Once | INTRAMUSCULAR | Status: AC
  Administered 2024-08-17: 10 [IU] via SUBCUTANEOUS

## 2024-08-17 MED ORDER — PROPOFOL 10 MG/ML IV BOLUS
INTRAVENOUS | Status: AC
  Filled 2024-08-17: qty 20

## 2024-08-17 MED ORDER — GENTAMICIN SULFATE 40 MG/ML IJ SOLN
INTRAMUSCULAR | Status: AC
  Filled 2024-08-17: qty 4

## 2024-08-17 MED ORDER — CLOPIDOGREL BISULFATE 75 MG PO TABS
75.0000 mg | ORAL_TABLET | Freq: Every day | ORAL | Status: DC
  Administered 2024-08-17 – 2024-08-18 (×2): 75 mg via ORAL
  Filled 2024-08-17 (×3): qty 1

## 2024-08-17 MED ORDER — APIXABAN 5 MG PO TABS: 5.0000 mg | ORAL_TABLET | Freq: Two times a day (BID) | ORAL | Status: DC

## 2024-08-17 MED ORDER — HEPARIN 30,000 UNITS/1000 ML (OHS) CELLSAVER SOLUTION
Status: AC
  Filled 2024-08-17: qty 1000

## 2024-08-17 MED ORDER — CHLORHEXIDINE GLUCONATE 4 % EX SOLN
60.0000 mL | Freq: Once | CUTANEOUS | Status: AC
  Administered 2024-08-17: 4 via TOPICAL

## 2024-08-17 MED ORDER — PHENYLEPHRINE 80 MCG/ML (10ML) SYRINGE FOR IV PUSH (FOR BLOOD PRESSURE SUPPORT)
PREFILLED_SYRINGE | INTRAVENOUS | Status: AC
  Filled 2024-08-17: qty 10

## 2024-08-17 MED ORDER — IODIXANOL 320 MG/ML IV SOLN
INTRAVENOUS | Status: DC | PRN
  Administered 2024-08-17: 45 mL via INTRA_ARTERIAL

## 2024-08-17 MED ORDER — VASOPRESSIN 20 UNIT/ML IV SOLN
INTRAVENOUS | Status: DC | PRN
  Administered 2024-08-17: 1 [IU] via INTRAVENOUS

## 2024-08-17 MED ORDER — NITROGLYCERIN IN D5W 200-5 MCG/ML-% IV SOLN
INTRAVENOUS | Status: AC
  Filled 2024-08-17: qty 250

## 2024-08-17 MED ORDER — ROCURONIUM BROMIDE 100 MG/10ML IV SOLN
INTRAVENOUS | Status: DC | PRN
  Administered 2024-08-17: 20 mg via INTRAVENOUS
  Administered 2024-08-17: 50 mg via INTRAVENOUS
  Administered 2024-08-17: 20 mg via INTRAVENOUS

## 2024-08-17 MED ORDER — HYDRALAZINE HCL 20 MG/ML IJ SOLN
10.0000 mg | Freq: Once | INTRAMUSCULAR | Status: AC
  Administered 2024-08-17: 10 mg via INTRAVENOUS

## 2024-08-17 MED ORDER — PHENYLEPHRINE 80 MCG/ML (10ML) SYRINGE FOR IV PUSH (FOR BLOOD PRESSURE SUPPORT)
PREFILLED_SYRINGE | INTRAVENOUS | Status: DC | PRN
  Administered 2024-08-17: 80 ug via INTRAVENOUS
  Administered 2024-08-17: 160 ug via INTRAVENOUS
  Administered 2024-08-17: 80 ug via INTRAVENOUS
  Administered 2024-08-17: 160 ug via INTRAVENOUS

## 2024-08-17 MED ORDER — ONDANSETRON HCL 4 MG/2ML IJ SOLN
INTRAMUSCULAR | Status: AC
  Filled 2024-08-17: qty 2

## 2024-08-17 MED ORDER — FENTANYL CITRATE (PF) 100 MCG/2ML IJ SOLN
INTRAMUSCULAR | Status: DC | PRN
  Administered 2024-08-17: 50 ug via INTRAVENOUS

## 2024-08-17 MED ORDER — LABETALOL HCL 5 MG/ML IV SOLN
INTRAVENOUS | Status: AC
  Filled 2024-08-17: qty 4

## 2024-08-17 MED ORDER — LIDOCAINE HCL (CARDIAC) PF 100 MG/5ML IV SOSY
PREFILLED_SYRINGE | INTRAVENOUS | Status: DC | PRN
  Administered 2024-08-17: 100 mg via INTRAVENOUS

## 2024-08-17 MED ORDER — HEPARIN SODIUM (PORCINE) 1000 UNIT/ML IJ SOLN
INTRAMUSCULAR | Status: AC
  Filled 2024-08-17: qty 10

## 2024-08-17 MED ORDER — INSULIN ASPART 100 UNIT/ML IJ SOLN
INTRAMUSCULAR | Status: AC
  Filled 2024-08-17: qty 10

## 2024-08-17 MED ORDER — ALBUMIN HUMAN 5 % IV SOLN
INTRAVENOUS | Status: AC
  Filled 2024-08-17: qty 250

## 2024-08-17 MED ORDER — GENTAMICIN SULFATE 40 MG/ML IJ SOLN
INTRAMUSCULAR | Status: DC | PRN
  Administered 2024-08-17: 160 mg

## 2024-08-17 MED ORDER — HYDRALAZINE HCL 20 MG/ML IJ SOLN
INTRAMUSCULAR | Status: AC
  Filled 2024-08-17: qty 1

## 2024-08-17 MED ORDER — PROPOFOL 10 MG/ML IV BOLUS
INTRAVENOUS | Status: AC
  Filled 2024-08-17: qty 40

## 2024-08-17 MED ORDER — ACETAMINOPHEN 500 MG PO TABS: 1000.0000 mg | ORAL_TABLET | Freq: Once | ORAL | Status: DC

## 2024-08-17 MED ORDER — NOREPINEPHRINE 4 MG/250ML-% IV SOLN
INTRAVENOUS | Status: AC
  Filled 2024-08-17: qty 250

## 2024-08-17 MED ORDER — SUGAMMADEX SODIUM 200 MG/2ML IV SOLN
INTRAVENOUS | Status: DC | PRN
  Administered 2024-08-17: 200 mg via INTRAVENOUS

## 2024-08-17 MED ORDER — PROTAMINE SULFATE 10 MG/ML IV SOLN
INTRAVENOUS | Status: AC
  Filled 2024-08-17: qty 25

## 2024-08-17 MED ORDER — PHENYLEPHRINE HCL-NACL 20-0.9 MG/250ML-% IV SOLN
INTRAVENOUS | Status: AC
  Filled 2024-08-17: qty 250

## 2024-08-17 MED ORDER — HEPARIN 30,000 UNITS/1000 ML (OHS) CELLSAVER SOLUTION
Status: AC | PRN
  Administered 2024-08-17: 1

## 2024-08-17 MED ORDER — FENTANYL CITRATE (PF) 100 MCG/2ML IJ SOLN
25.0000 ug | INTRAMUSCULAR | Status: DC | PRN
  Administered 2024-08-17 (×2): 25 ug via INTRAVENOUS

## 2024-08-17 MED ORDER — SUCCINYLCHOLINE CHLORIDE 200 MG/10ML IV SOSY
PREFILLED_SYRINGE | INTRAVENOUS | Status: AC
  Filled 2024-08-17: qty 10

## 2024-08-17 MED ORDER — ACETAMINOPHEN 10 MG/ML IV SOLN: 1000.0000 mg | Freq: Once | INTRAVENOUS | Status: DC | PRN

## 2024-08-17 MED ORDER — VANCOMYCIN HCL 1 G IV SOLR
INTRAVENOUS | Status: DC | PRN
  Administered 2024-08-17: 1000 mg

## 2024-08-17 MED ORDER — NICARDIPINE HCL IN NACL 20-0.86 MG/200ML-% IV SOLN
INTRAVENOUS | Status: AC
  Filled 2024-08-17: qty 200

## 2024-08-17 MED ORDER — HEPARIN SODIUM (PORCINE) 1000 UNIT/ML IJ SOLN
INTRAMUSCULAR | Status: DC | PRN
  Administered 2024-08-17: 2000 [IU] via INTRAVENOUS
  Administered 2024-08-17: 6000 [IU] via INTRAVENOUS

## 2024-08-17 MED ORDER — ACETAMINOPHEN 10 MG/ML IV SOLN
INTRAVENOUS | Status: DC | PRN
  Administered 2024-08-17: 1000 mg via INTRAVENOUS

## 2024-08-17 MED ORDER — CHLORHEXIDINE GLUCONATE CLOTH 2 % EX PADS
6.0000 | MEDICATED_PAD | Freq: Every day | CUTANEOUS | Status: DC
  Administered 2024-08-17 – 2024-08-22 (×6): 6 via TOPICAL

## 2024-08-17 MED ORDER — ASPIRIN 81 MG PO TBEC
81.0000 mg | DELAYED_RELEASE_TABLET | Freq: Every day | ORAL | Status: DC
  Administered 2024-08-17 – 2024-08-22 (×6): 81 mg via ORAL
  Filled 2024-08-17 (×6): qty 1

## 2024-08-17 SURGICAL SUPPLY — 2 items
GRAFT VASC PATCH XENOSURE 1X14 (Vascular Products) IMPLANT
KIT STIMULAN RAPID CURE 5CC (Orthopedic Implant) IMPLANT

## 2024-08-17 NOTE — Interval H&P Note (Signed)
 History and Physical Interval Note:  08/17/2024 8:06 AM  [name]  has presented today for surgery, with the diagnosis of PAD with ulcer.  The various methods of treatment have been discussed with the patient and family. After consideration of risks, benefits and other options for treatment, the patient has consented to  Procedures with comments: ENDARTERECTOMY, FEMORAL (Left) APPLICATION OF CELL SAVER (Left) - SFA thrombectomy and stent INSERTION, STENT, ARTERY, ILIAC (Left) - SFA THROMBECTOMY, ARTERY, FEMORAL (Left) as a surgical intervention.  The patient's history has been reviewed, patient examined, no change in status, stable for surgery.  I have reviewed the patient's chart and labs.  Questions were answered to the patient's satisfaction.     Jamilett Ferrante

## 2024-08-17 NOTE — Progress Notes (Signed)
 Shower chin to toes with HIBICLENS  completed. Will notify incoming shift.

## 2024-08-17 NOTE — OR PostOp (Incomplete)
 PACU TO INPATIENT HANDOFF REPORT  Name/Age/Gender [name] 69 y.o. male  Code Status    Code Status Orders  (From admission, onward)           Start     Ordered   08/11/24 0156  Full code  Continuous       Question:  By:  Answer:  Other   08/11/24 0156           Code Status History     Date Active Date Inactive Code Status Order ID Comments User Context   03/28/2024 0554 04/01/2024 2029 Full Code [id number]  Franky Redia SAILOR, MD ED   03/09/2024 [phone] 03/10/2024 2121 Full Code [id number]  Perri DELENA Meliton Mickey., MD Inpatient   02/28/2024 0052 03/04/2024 2103 Full Code [id number]  Alfornia Madison, MD ED   02/15/2024 1111 02/16/2024 1526 Full Code [id number]  Pearline Norman RAMAN, MD Inpatient   12/29/2023 2113 01/02/2024 1657 Full Code [id number]  Zella Katha HERO, MD ED   06/05/2019 0306 06/09/2019 1615 Full Code [id number]  Norins, Ozell BRAVO, MD ED       Home/SNF/Other {Discharge Destination:18313::Home}  Chief Complaint Osteomyelitis of left foot (HCC) [M86.9] Left foot infection [L08.9] Fall, initial encounter [W19.XXXA]  Level of Care/Admitting Diagnosis ED Disposition     ED Disposition  Admit   Condition  --   Comment  Hospital Area: [facility] [[id number]]  Level of Care: Med-Surg [16]  Diagnosis: Osteomyelitis of left foot [facility]) [611575]  Admitting Physician: CLEATUS DELAYNE GAILS [[id number]]  Attending Physician: CLEATUS DELAYNE GAILS [[id number]]  Certification:: I certify this patient will need inpatient services for at least 2 midnights  Expected Medical Readiness: 08/12/2024          Medical History Past Medical History:  Diagnosis Date   Adenomatous colon polyp    Allergic rhinitis    At risk for sleep apnea    STOP-BANG= 5   SENT TO PCP 03-14-2014   Bladder cancer (HCC)    CAD (coronary artery disease)    a. 07/2014 low risk MV; b. 07/2019 Cor CTA (FFR): LM 25-49 (nl), LAD mild prox/mid plaque (nl), D1 25-49p(nl w/ abnl FFR of 0.73 in  inf branch), LCX/OM1 mild prox/mid plaque (nl), RCA nondominant, minimal Ca2+ plaque (nl), RPDA (mildly abnl @ 0.79)-->Med Rx..   Cataract    surgically removed bilateral   Condyloma acuminatum of penis    Diabetic neuropathy (HCC)    Diastolic dysfunction    a. 05/2019 Echo: EF 60-65%, no rwam, mod LVH, impaired relaxation, nl RV size/fxn, trace MR, triv TR.   Diverticulosis    GERD (gastroesophageal reflux disease)    History of bladder cancer    s/p  turbt  2013/   transitional cell carcinoma--    History of condyloma acuminatum    PERINEAL AREA  W/ RECURRENCY   History of gout    Hyperlipidemia    Hypertension    Hypoxemia 03/09/2024   Lower urinary tract symptoms (LUTS)    PAF (paroxysmal atrial fibrillation) (HCC)    a. 06/2019 Event monitor: PAF <1% burden. Longest 3 mins 36 secs.   Productive cough    PSVT (paroxysmal supraventricular tachycardia)    a. 06/2019 Event monitor: 112 episodes of SVT, longest 21 secs.   PVD (peripheral vascular disease) with claudication    a. 03/2014 LE art duplex: long segment occlusion of mid to distal R SFA; b. 03/2020 ABI: nl left and mildly improved R ABI->med rx.  Renal artery stenosis    Renal artery stenosis    a. 12/2020 Renal art duplex: RRA 1-59%, LRA >60%.   Smokers' cough (HCC)    Type 2 diabetes mellitus with insulin  therapy (HCC) 1992   monitor by  dr ellsion   Wears dentures    upper    Allergies Allergies[1]  IV Location/Drains/Wounds Patient Lines/Drains/Airways Status     Active Line/Drains/Airways     Name Placement date Placement time Site Days   Arterial Line 08/17/24 Left Radial 08/17/24  0843  Radial  less than 1   Peripheral IV 08/12/24 22 G 2.5 Anterior;Right Forearm 08/12/24  1724  Forearm  5   Peripheral IV 08/16/24 22 G 1 Left;Anterior Forearm 08/16/24  2148  Forearm  1   Peripheral IV 08/17/24 20 G Right Antecubital 08/17/24  0903  Antecubital  less than 1   Urethral Catheter Annabella Gambler, RN  Non-latex 16 Fr. 08/17/24  0910  Non-latex  less than 1   Wound Diabetic Ulcer Toe (Comment  which one) Anterior;Left --  --  Toe (Comment  which one)  --   Wound 08/11/24 0100 Diabetic Ulcer Heel Left 08/11/24  0100  Heel  6   Wound 08/11/24 0100 Surgical Toe (Comment  which one) Anterior;Left 08/11/24  0100  Toe (Comment  which one)  6   Wound 08/17/24 1133 Surgical Closed Surgical Incision Groin Left 08/17/24  1133  Groin  less than 1            Labs/Imaging Results for orders placed or performed during the hospital encounter of 08/10/24 (from the past 48 hours)  Glucose, capillary     Status: Abnormal   Collection Time: 08/15/24  2:38 PM  Result Value Ref Range   Glucose-Capillary 109 (H) 70 - 99 mg/dL    Comment: Glucose reference range applies only to samples taken after fasting for at least 8 hours.  Glucose, capillary     Status: Abnormal   Collection Time: 08/15/24  4:23 PM  Result Value Ref Range   Glucose-Capillary 118 (H) 70 - 99 mg/dL    Comment: Glucose reference range applies only to samples taken after fasting for at least 8 hours.  Glucose, capillary     Status: Abnormal   Collection Time: 08/15/24  5:04 PM  Result Value Ref Range   Glucose-Capillary 123 (H) 70 - 99 mg/dL    Comment: Glucose reference range applies only to samples taken after fasting for at least 8 hours.  Glucose, capillary     Status: Abnormal   Collection Time: 08/15/24  9:09 PM  Result Value Ref Range   Glucose-Capillary 244 (H) 70 - 99 mg/dL    Comment: Glucose reference range applies only to samples taken after fasting for at least 8 hours.  CBC     Status: Abnormal   Collection Time: 08/16/24  4:36 AM  Result Value Ref Range   WBC 10.7 (H) 4.0 - 10.5 K/uL   RBC 3.90 (L) 4.22 - 5.81 MIL/uL   Hemoglobin 12.6 (L) 13.0 - 17.0 g/dL   HCT 63.7 (L) 60.9 - 47.9 %   MCV 92.8 80.0 - 100.0 fL   MCH 32.3 26.0 - 34.0 pg   MCHC 34.8 30.0 - 36.0 g/dL   RDW 86.2 88.4 - 84.4 %   Platelets 386 150 -  400 K/uL   nRBC 0.0 0.0 - 0.2 %    Comment: Performed at [facility], [address], [place], [place]  [address]  Glucose, capillary     Status: Abnormal   Collection Time: 08/16/24  7:27 AM  Result Value Ref Range   Glucose-Capillary 167 (H) 70 - 99 mg/dL    Comment: Glucose reference range applies only to samples taken after fasting for at least 8 hours.  Heparin  level (unfractionated)     Status: Abnormal   Collection Time: 08/16/24  8:19 AM  Result Value Ref Range   Heparin  Unfractionated 0.20 (L) 0.30 - 0.70 IU/mL    Comment: (NOTE) The clinical reportable range upper limit is being lowered to >1.10 to align with the FDA approved guidance for the current laboratory assay.  If heparin  results are below expected values, and patient dosage has  been confirmed, suggest follow up testing of antithrombin III levels. Performed at [facility], [address] [place], [place] [address]   Glucose, capillary     Status: Abnormal   Collection Time: 08/16/24 11:30 AM  Result Value Ref Range   Glucose-Capillary 289 (H) 70 - 99 mg/dL    Comment: Glucose reference range applies only to samples taken after fasting for at least 8 hours.  Glucose, capillary     Status: Abnormal   Collection Time: 08/16/24  2:06 PM  Result Value Ref Range   Glucose-Capillary 204 (H) 70 - 99 mg/dL    Comment: Glucose reference range applies only to samples taken after fasting for at least 8 hours.  POCT I-Stat EG7     Status: Abnormal   Collection Time: 08/16/24  2:55 PM  Result Value Ref Range   pH, Ven 7.412 7.25 - 7.43   pCO2, Ven 48.7 44 - 60 mmHg   pO2, Ven 33 32 - 45 mmHg   Bicarbonate 31.0 (H) 20.0 - 28.0 mmol/L   TCO2 32 22 - 32 mmol/L   O2 Saturation 63 %   Acid-Base Excess 5.0 (H) 0.0 - 2.0 mmol/L   Sodium 130 (L) 135 - 145 mmol/L   Potassium 3.4 (L) 3.5 - 5.1 mmol/L   Calcium , Ion 1.12 (L) 1.15 - 1.40 mmol/L   HCT 36.0 (L) 39.0 - 52.0 %   Hemoglobin 12.2 (L) 13.0 - 17.0  g/dL   Sample type VENOUS    Comment NOTIFIED PHYSICIAN   I-STAT 7, (LYTES, BLD GAS, ICA, H+H)     Status: Abnormal   Collection Time: 08/16/24  2:57 PM  Result Value Ref Range   pH, Arterial 7.456 (H) 7.35 - 7.45   pCO2 arterial 43.3 32 - 48 mmHg   pO2, Arterial 62 (L) 83 - 108 mmHg   Bicarbonate 30.5 (H) 20.0 - 28.0 mmol/L   TCO2 32 22 - 32 mmol/L   O2 Saturation 93 %   Acid-Base Excess 6.0 (H) 0.0 - 2.0 mmol/L   Sodium 134 (L) 135 - 145 mmol/L   Potassium 3.5 3.5 - 5.1 mmol/L   Calcium , Ion 1.13 (L) 1.15 - 1.40 mmol/L   HCT 35.0 (L) 39.0 - 52.0 %   Hemoglobin 11.9 (L) 13.0 - 17.0 g/dL   Sample type ARTERIAL   POCT Activated clotting time     Status: None   Collection Time: 08/16/24  3:21 PM  Result Value Ref Range   Activated Clotting Time 240 seconds    Comment: Reference range 74-137 seconds for patients not on anticoagulant therapy.  Prepare RBC (crossmatch)     Status: None   Collection Time: 08/16/24  4:30 PM  Result Value Ref Range   Order Confirmation  ORDER PROCESSED BY BLOOD BANK Performed at [facility], [address] [place], [place] [address]   Glucose, capillary     Status: Abnormal   Collection Time: 08/16/24  5:38 PM  Result Value Ref Range   Glucose-Capillary 183 (H) 70 - 99 mg/dL    Comment: Glucose reference range applies only to samples taken after fasting for at least 8 hours.  Basic metabolic panel     Status: Abnormal   Collection Time: 08/16/24  6:43 PM  Result Value Ref Range   Sodium 137 135 - 145 mmol/L   Potassium 3.8 3.5 - 5.1 mmol/L   Chloride 95 (L) 98 - 111 mmol/L   CO2 32 22 - 32 mmol/L   Glucose, Bld 190 (H) 70 - 99 mg/dL    Comment: Glucose reference range applies only to samples taken after fasting for at least 8 hours.   BUN 17 8 - 23 mg/dL   Creatinine, Ser 8.91 0.61 - 1.24 mg/dL   Calcium  9.1 8.9 - 10.3 mg/dL   GFR, Estimated >39 >39 mL/min    Comment: (NOTE) Calculated using the CKD-EPI Creatinine Equation  (2021)    Anion gap 10 5 - 15    Comment: Performed at [facility], [address] [place], [place] [address]  Glucose, capillary     Status: Abnormal   Collection Time: 08/16/24  9:37 PM  Result Value Ref Range   Glucose-Capillary 291 (H) 70 - 99 mg/dL    Comment: Glucose reference range applies only to samples taken after fasting for at least 8 hours.  CBC     Status: Abnormal   Collection Time: 08/17/24  2:21 AM  Result Value Ref Range   WBC 11.4 (H) 4.0 - 10.5 K/uL   RBC 4.07 (L) 4.22 - 5.81 MIL/uL   Hemoglobin 13.0 13.0 - 17.0 g/dL   HCT 61.9 (L) 60.9 - 47.9 %   MCV 93.4 80.0 - 100.0 fL   MCH 31.9 26.0 - 34.0 pg   MCHC 34.2 30.0 - 36.0 g/dL   RDW 86.4 88.4 - 84.4 %   Platelets 401 (H) 150 - 400 K/uL   nRBC 0.0 0.0 - 0.2 %    Comment: Performed at [facility], [address] [place], [place] [address]  Heparin  level (unfractionated)     Status: None   Collection Time: 08/17/24  2:21 AM  Result Value Ref Range   Heparin  Unfractionated 0.30 0.30 - 0.70 IU/mL    Comment: (NOTE) The clinical reportable range upper limit is being lowered to >1.10 to align with the FDA approved guidance for the current laboratory assay.  If heparin  results are below expected values, and patient dosage has  been confirmed, suggest follow up testing of antithrombin III levels. Performed at [facility], [address] [place], [place] [address]   Glucose, capillary     Status: Abnormal   Collection Time: 08/17/24  7:54 AM  Result Value Ref Range   Glucose-Capillary 282 (H) 70 - 99 mg/dL    Comment: Glucose reference range applies only to samples taken after fasting for at least 8 hours.  Glucose, capillary     Status: Abnormal   Collection Time: 08/17/24  8:07 AM  Result Value Ref Range   Glucose-Capillary 275 (H) 70 - 99 mg/dL    Comment: Glucose reference range applies only to samples taken after fasting for at least 8 hours.   Comment 1 Notify RN  Comment 2 Document in Chart   Glucose, capillary     Status: Abnormal   Collection Time: 08/17/24 11:35 AM  Result Value Ref Range   Glucose-Capillary 176 (H) 70 - 99 mg/dL    Comment: Glucose reference range applies only to samples taken after fasting for at least 8 hours.  Glucose, capillary     Status: Abnormal   Collection Time: 08/17/24 12:01 PM  Result Value Ref Range   Glucose-Capillary 171 (H) 70 - 99 mg/dL    Comment: Glucose reference range applies only to samples taken after fasting for at least 8 hours.   DG C-Arm 1-60 Min-No Report Result Date: 08/17/2024 Fluoroscopy was utilized by the requesting physician.  No radiographic interpretation.   CARDIAC CATHETERIZATION Result Date: 08/16/2024   Mid LAD lesion is 50% stenosed.   Ost LM lesion is 30% stenosed.   There is moderate to severe left ventricular systolic dysfunction.   LV end diastolic pressure is mildly elevated.   There is no aortic valve stenosis.   Anticipated discharge date to be determined.   Continue heparin .  Defer continuation of aspirin  as well as transition back to apixaban  to vascular surgery/internal medicine. Conclusions: Mild-moderate, nonobstructive coronary artery disease with 30% ostial LMCA and 50% mid LAD stenoses that are not hemodynamically significant (RFR 0.92). Mildly elevated left heart filling pressures (PCWP 20, LVEDP 18 mmHg). Upper normal right heart filling pressure (mean RA 5, RV EDP 6 mmHg). Moderate pulmonary hypertension (PA 60/22, mean 35 mmHg; PVR 3 WU). Normal Fick cardiac output/index (CO 5.0 L/min, CI 2.7 L/min/m). Recommendations: Continue medical therapy to prevent progression of moderate, nonobstructive CAD. Continue gentle diuresis and escalation of goal-directed medical therapy for nonischemic cardiomyopathy. Lonni Hanson, MD [facility]  PERIPHERAL VASCULAR CATHETERIZATION Result Date: 08/15/2024 See surgical note for result.   Pending Labs   Vitals/Pain Today's Vitals    08/17/24 1245 08/17/24 1250 08/17/24 1300 08/17/24 1310  BP: 138/69 138/69 133/60   Pulse: 70 69 65 62  Resp: 20 (!) 22 20 19   Temp:   99.1 F (37.3 C)   TempSrc:      SpO2: 96% 95% 94% 93%  Weight:      Height:      PainSc: 7  Asleep 5  3     Isolation Precautions @ISOLATION @  Administered Medications Periop Administered Meds from 08/17/2024 0809 to 08/17/2024 1323       Date/Time Order Dose Route Action Action by Comments    08/17/2024 0809 EST 0.9 %  sodium chloride  infusion -- Intravenous MAR Hold Transfer Provider, Automatic --    08/17/2024 1150 EST 0.9 %  sodium chloride  infusion -- Intravenous Anesthesia Volume Adjustment Jestine Palma, CRNA --    08/17/2024 0956 EST 0.9 %  sodium chloride  infusion -- Intravenous Restarted Jestine Palma, CRNA --    08/17/2024 0955 EST 0.9 %  sodium chloride  infusion -- Intravenous Neomi Jestine Palma, CRNA Switch to gravity    08/17/2024 0839 EST 0.9 %  sodium chloride  infusion -- Intravenous Continued from Pre-op Jestine Palma, CRNA --    08/17/2024 1150 EST 0.9 %  sodium chloride  infusion -- Intravenous Anesthesia Volume Adjustment Jestine Palma, CRNA --    08/17/2024 0956 EST 0.9 %  sodium chloride  infusion -- Intravenous Anesthesia Volume Adjustment Jestine Palma, CRNA --    08/17/2024 0848 EST 0.9 %  sodium chloride  infusion -- Intravenous New Bag/Given Jestine Palma, CRNA --    08/17/2024 0809 EST 0.9 %  sodium chloride  infusion (  Manually program via Guardrails IV Fluids) -- Intravenous MAR Hold Transfer Provider, Automatic --    08/17/2024 1106 EST acetaminophen  (OFIRMEV ) IV 1,000 mg Intravenous Given Jestine Palma, CRNA --    08/17/2024 0809 EST acetaminophen  (TYLENOL ) suppository 650 mg -- Rectal MAR Hold Transfer Provider, Automatic --    08/17/2024 0809 EST acetaminophen  (TYLENOL ) tablet 650 mg -- Oral MAR Hold Transfer Provider, Automatic --    08/17/2024 0809 EST albuterol  (PROVENTIL ) (2.5 MG/3ML) 0.083% nebulizer solution 2.5 mg  -- Inhalation MAR Hold Transfer Provider, Automatic --    08/27/2024 1000 EST amiodarone  (PACERONE ) tablet 200 mg -- Oral Automatically Held Transfer Provider, Automatic --    08/26/2024 1000 EST amiodarone  (PACERONE ) tablet 200 mg -- Oral Automatically Held Transfer Provider, Automatic --    08/25/2024 1000 EST amiodarone  (PACERONE ) tablet 200 mg -- Oral Automatically Held Transfer Provider, Automatic --    08/24/2024 1000 EST amiodarone  (PACERONE ) tablet 200 mg -- Oral Automatically Held Transfer Provider, Automatic --    08/23/2024 1000 EST amiodarone  (PACERONE ) tablet 200 mg -- Oral Automatically Held Transfer Provider, Automatic --    08/22/2024 1000 EST amiodarone  (PACERONE ) tablet 200 mg -- Oral Automatically Held Transfer Provider, Automatic --    08/21/2024 1000 EST amiodarone  (PACERONE ) tablet 200 mg -- Oral Automatically Held Transfer Provider, Automatic --    08/20/2024 1000 EST amiodarone  (PACERONE ) tablet 200 mg -- Oral Automatically Held Transfer Provider, Automatic --    08/19/2024 1000 EST amiodarone  (PACERONE ) tablet 200 mg -- Oral Automatically Held Transfer Provider, Automatic --    08/18/2024 1000 EST amiodarone  (PACERONE ) tablet 200 mg -- Oral Automatically Held Transfer Provider, Automatic --    08/17/2024 1000 EST amiodarone  (PACERONE ) tablet 200 mg -- Oral Automatically Held Transfer Provider, Automatic --    08/17/2024 0809 EST amiodarone  (PACERONE ) tablet 200 mg -- Oral MAR Hold Transfer Provider, Automatic --    08/27/2024 1000 EST atorvastatin  (LIPITOR ) tablet 40 mg -- Oral Automatically Held Transfer Provider, Automatic --    08/26/2024 1000 EST atorvastatin  (LIPITOR ) tablet 40 mg -- Oral Automatically Held Transfer Provider, Automatic --    08/25/2024 1000 EST atorvastatin  (LIPITOR ) tablet 40 mg -- Oral Automatically Held Transfer Provider, Automatic --    08/24/2024 1000 EST atorvastatin  (LIPITOR ) tablet 40 mg -- Oral Automatically Held Transfer Provider, Automatic --     08/23/2024 1000 EST atorvastatin  (LIPITOR ) tablet 40 mg -- Oral Automatically Held Transfer Provider, Automatic --    08/22/2024 1000 EST atorvastatin  (LIPITOR ) tablet 40 mg -- Oral Automatically Held Transfer Provider, Automatic --    08/21/2024 1000 EST atorvastatin  (LIPITOR ) tablet 40 mg -- Oral Automatically Held Transfer Provider, Automatic --    08/20/2024 1000 EST atorvastatin  (LIPITOR ) tablet 40 mg -- Oral Automatically Held Transfer Provider, Automatic --    08/19/2024 1000 EST atorvastatin  (LIPITOR ) tablet 40 mg -- Oral Automatically Held Transfer Provider, Automatic --    08/18/2024 1000 EST atorvastatin  (LIPITOR ) tablet 40 mg -- Oral Automatically Held Transfer Provider, Automatic --    08/17/2024 1000 EST atorvastatin  (LIPITOR ) tablet 40 mg -- Oral Automatically Held Transfer Provider, Automatic --    08/17/2024 0809 EST atorvastatin  (LIPITOR ) tablet 40 mg -- Oral MAR Hold Transfer Provider, Automatic --    08/27/2024 1000 EST citalopram  (CELEXA ) tablet 20 mg -- Oral Automatically Held Transfer Provider, Automatic --    08/26/2024 1000 EST citalopram  (CELEXA ) tablet 20 mg -- Oral Automatically Held Transfer Provider, Automatic --    08/25/2024 1000 EST citalopram  (CELEXA ) tablet 20 mg --  Oral Automatically Held Location Manager, Automatic --    08/24/2024 1000 EST citalopram  (CELEXA ) tablet 20 mg -- Oral Automatically Held Transfer Provider, Automatic --    08/23/2024 1000 EST citalopram  (CELEXA ) tablet 20 mg -- Oral Automatically Held Transfer Provider, Automatic --    08/22/2024 1000 EST citalopram  (CELEXA ) tablet 20 mg -- Oral Automatically Held Transfer Provider, Automatic --    08/21/2024 1000 EST citalopram  (CELEXA ) tablet 20 mg -- Oral Automatically Held Transfer Provider, Automatic --    08/20/2024 1000 EST citalopram  (CELEXA ) tablet 20 mg -- Oral Automatically Held Transfer Provider, Automatic --    08/19/2024 1000 EST citalopram  (CELEXA ) tablet 20 mg -- Oral Automatically  Held Transfer Provider, Automatic --    08/18/2024 1000 EST citalopram  (CELEXA ) tablet 20 mg -- Oral Automatically Held Transfer Provider, Automatic --    08/17/2024 1000 EST citalopram  (CELEXA ) tablet 20 mg -- Oral Automatically Held Transfer Provider, Automatic --    08/17/2024 0809 EST citalopram  (CELEXA ) tablet 20 mg -- Oral MAR Hold Transfer Provider, Automatic --    08/17/2024 0849 EST fentaNYL  (SUBLIMAZE ) injection 50 mcg Intravenous Given Jestine Palma, CRNA --    08/17/2024 1304 EST fentaNYL  (SUBLIMAZE ) injection 25-50 mcg 25 mcg Intravenous Given Sherrine Annabelle BROCKS, RN --    08/17/2024 1247 EST fentaNYL  (SUBLIMAZE ) injection 25-50 mcg 25 mcg Intravenous Given Sherrine Annabelle BROCKS, RN --    08/27/2024 1000 EST furosemide  (LASIX ) tablet 40 mg -- Oral Automatically Held Transfer Provider, Automatic --    08/26/2024 1000 EST furosemide  (LASIX ) tablet 40 mg -- Oral Automatically Held Transfer Provider, Automatic --    08/25/2024 1000 EST furosemide  (LASIX ) tablet 40 mg -- Oral Automatically Held Transfer Provider, Automatic --    08/24/2024 1000 EST furosemide  (LASIX ) tablet 40 mg -- Oral Automatically Held Transfer Provider, Automatic --    08/23/2024 1000 EST furosemide  (LASIX ) tablet 40 mg -- Oral Automatically Held Transfer Provider, Automatic --    08/22/2024 1000 EST furosemide  (LASIX ) tablet 40 mg -- Oral Automatically Held Transfer Provider, Automatic --    08/21/2024 1000 EST furosemide  (LASIX ) tablet 40 mg -- Oral Automatically Held Transfer Provider, Automatic --    08/20/2024 1000 EST furosemide  (LASIX ) tablet 40 mg -- Oral Automatically Held Transfer Provider, Automatic --    08/19/2024 1000 EST furosemide  (LASIX ) tablet 40 mg -- Oral Automatically Held Transfer Provider, Automatic --    08/18/2024 1000 EST furosemide  (LASIX ) tablet 40 mg -- Oral Automatically Held Transfer Provider, Automatic --    08/17/2024 1000 EST furosemide  (LASIX ) tablet 40 mg -- Oral Automatically Held Transfer  Provider, Automatic --    08/17/2024 0809 EST furosemide  (LASIX ) tablet 40 mg -- Oral MAR Hold Transfer Provider, Automatic --    08/17/2024 1128 EST gentamicin  (GARAMYCIN ) injection 160 mg Other Given Marea Selinda RAMAN, MD MIXED WITH STIMULAN BEADS    08/30/2024 2200 EST guaiFENesin  (MUCINEX ) 12 hr tablet 600 mg -- Oral Automatically Held Transfer Provider, Automatic --    08/30/2024 1000 EST guaiFENesin  (MUCINEX ) 12 hr tablet 600 mg -- Oral Automatically Held Transfer Provider, Automatic --    08/29/2024 2200 EST guaiFENesin  (MUCINEX ) 12 hr tablet 600 mg -- Oral Automatically Held Transfer Provider, Automatic --    08/29/2024 1000 EST guaiFENesin  (MUCINEX ) 12 hr tablet 600 mg -- Oral Automatically Held Transfer Provider, Automatic --    08/28/2024 2200 EST guaiFENesin  (MUCINEX ) 12 hr tablet 600 mg -- Oral Automatically Held Transfer Provider, Automatic --    08/28/2024 1000 EST guaiFENesin  (MUCINEX ) 12  hr tablet 600 mg -- Oral Automatically Held Transfer Provider, Automatic --    08/27/2024 2200 EST guaiFENesin  (MUCINEX ) 12 hr tablet 600 mg -- Oral Automatically Held Transfer Provider, Automatic --    08/27/2024 1000 EST guaiFENesin  (MUCINEX ) 12 hr tablet 600 mg -- Oral Automatically Held Transfer Provider, Automatic --    08/26/2024 2200 EST guaiFENesin  (MUCINEX ) 12 hr tablet 600 mg -- Oral Automatically Held Transfer Provider, Automatic --    08/26/2024 1000 EST guaiFENesin  (MUCINEX ) 12 hr tablet 600 mg -- Oral Automatically Held Transfer Provider, Automatic --    08/25/2024 2200 EST guaiFENesin  (MUCINEX ) 12 hr tablet 600 mg -- Oral Automatically Held Transfer Provider, Automatic --    08/25/2024 1000 EST guaiFENesin  (MUCINEX ) 12 hr tablet 600 mg -- Oral Automatically Held Transfer Provider, Automatic --    08/24/2024 2200 EST guaiFENesin  (MUCINEX ) 12 hr tablet 600 mg -- Oral Automatically Held Transfer Provider, Automatic --    08/24/2024 1000 EST guaiFENesin  (MUCINEX ) 12 hr tablet 600 mg -- Oral  Automatically Held Transfer Provider, Automatic --    08/23/2024 2200 EST guaiFENesin  (MUCINEX ) 12 hr tablet 600 mg -- Oral Automatically Held Transfer Provider, Automatic --    08/23/2024 1000 EST guaiFENesin  (MUCINEX ) 12 hr tablet 600 mg -- Oral Automatically Held Transfer Provider, Automatic --    08/22/2024 2200 EST guaiFENesin  (MUCINEX ) 12 hr tablet 600 mg -- Oral Automatically Held Transfer Provider, Automatic --    08/22/2024 1000 EST guaiFENesin  (MUCINEX ) 12 hr tablet 600 mg -- Oral Automatically Held Transfer Provider, Automatic --    08/21/2024 2200 EST guaiFENesin  (MUCINEX ) 12 hr tablet 600 mg -- Oral Automatically Held Transfer Provider, Automatic --    08/21/2024 1000 EST guaiFENesin  (MUCINEX ) 12 hr tablet 600 mg -- Oral Automatically Held Transfer Provider, Automatic --    08/20/2024 2200 EST guaiFENesin  (MUCINEX ) 12 hr tablet 600 mg -- Oral Automatically Held Transfer Provider, Automatic --    08/20/2024 1000 EST guaiFENesin  (MUCINEX ) 12 hr tablet 600 mg -- Oral Automatically Held Transfer Provider, Automatic --    08/19/2024 2200 EST guaiFENesin  (MUCINEX ) 12 hr tablet 600 mg -- Oral Automatically Held Transfer Provider, Automatic --    08/19/2024 1000 EST guaiFENesin  (MUCINEX ) 12 hr tablet 600 mg -- Oral Automatically Held Transfer Provider, Automatic --    08/18/2024 2200 EST guaiFENesin  (MUCINEX ) 12 hr tablet 600 mg -- Oral Automatically Held Transfer Provider, Automatic --    08/18/2024 1000 EST guaiFENesin  (MUCINEX ) 12 hr tablet 600 mg -- Oral Automatically Held Transfer Provider, Automatic --    08/17/2024 2200 EST guaiFENesin  (MUCINEX ) 12 hr tablet 600 mg -- Oral Automatically Held Transfer Provider, Automatic --    08/17/2024 1000 EST guaiFENesin  (MUCINEX ) 12 hr tablet 600 mg -- Oral Automatically Held Transfer Provider, Automatic --    08/17/2024 0809 EST guaiFENesin  (MUCINEX ) 12 hr tablet 600 mg -- Oral MAR Hold Transfer Provider, Automatic --    08/17/2024 0809 EST  guaiFENesin -dextromethorphan  (ROBITUSSIN DM) 100-10 MG/5ML syrup 10 mL -- Oral MAR Hold Transfer Provider, Automatic --    08/17/2024 1127 EST hemostatic agents (no charge) Optime 1 Application Topical Given Marea Selinda RAMAN, MD fibrillar    08/17/2024 1126 EST hemostatic agents (no charge) Optime 1 Application Topical Given Marea Selinda RAMAN, MD [facility]    08/17/2024 0958 EST heparin  30,000 units/NS 1000 mL solution for CELLSAVER 1 Application Other New Bag/Given Marea Selinda RAMAN, MD cellsaver    08/17/2024 0829 EST heparin  ADULT infusion 100 units/mL (25000 units/250mL) 0 Units/hr Intravenous Stopped  Vicci Camellia Glatter, MD --    08/17/2024 1055 EST heparin  sodium (porcine) injection 2,000 Units Intravenous Given Jestine Palma, CRNA --    08/17/2024 0955 EST heparin  sodium (porcine) injection 6,000 Units Intravenous Given Jestine Palma, CRNA --    08/17/2024 1239 EST hydrALAZINE  (APRESOLINE ) injection 10 mg 10 mg Intravenous Given Sherrine Annabelle BROCKS, RN --    08/17/2024 0809 EST HYDROcodone -acetaminophen  (NORCO/VICODIN) 5-325 MG per tablet 1-2 tablet -- Oral MAR Hold Transfer Provider, Automatic --    08/28/2024 2200 EST insulin  aspart (novoLOG ) injection 0-5 Units -- Subcutaneous Automatically Held Transfer Provider, Automatic --    08/27/2024 2200 EST insulin  aspart (novoLOG ) injection 0-5 Units -- Subcutaneous Automatically Held Transfer Provider, Automatic --    08/26/2024 2200 EST insulin  aspart (novoLOG ) injection 0-5 Units -- Subcutaneous Automatically Held Transfer Provider, Automatic --    08/25/2024 2200 EST insulin  aspart (novoLOG ) injection 0-5 Units -- Subcutaneous Automatically Held Transfer Provider, Automatic --    08/24/2024 2200 EST insulin  aspart (novoLOG ) injection 0-5 Units -- Subcutaneous Automatically Held Transfer Provider, Automatic --    08/23/2024 2200 EST insulin  aspart (novoLOG ) injection 0-5 Units -- Subcutaneous Automatically Held Transfer Provider, Automatic --    08/22/2024 2200  EST insulin  aspart (novoLOG ) injection 0-5 Units -- Subcutaneous Automatically Held Transfer Provider, Automatic --    08/21/2024 2200 EST insulin  aspart (novoLOG ) injection 0-5 Units -- Subcutaneous Automatically Held Transfer Provider, Automatic --    08/20/2024 2200 EST insulin  aspart (novoLOG ) injection 0-5 Units -- Subcutaneous Automatically Held Transfer Provider, Automatic --    08/19/2024 2200 EST insulin  aspart (novoLOG ) injection 0-5 Units -- Subcutaneous Automatically Held Transfer Provider, Automatic --    08/18/2024 2200 EST insulin  aspart (novoLOG ) injection 0-5 Units -- Subcutaneous Automatically Held Transfer Provider, Automatic --    08/17/2024 2200 EST insulin  aspart (novoLOG ) injection 0-5 Units -- Subcutaneous Automatically Held Transfer Provider, Automatic --    08/17/2024 0809 EST insulin  aspart (novoLOG ) injection 0-5 Units -- Subcutaneous MAR Hold Transfer Provider, Automatic --    08/28/2024 1700 EST insulin  aspart (novoLOG ) injection 0-6 Units -- Subcutaneous Automatically Held Transfer Provider, Automatic --    08/28/2024 1200 EST insulin  aspart (novoLOG ) injection 0-6 Units -- Subcutaneous Automatically Held Transfer Provider, Automatic --    08/28/2024 0800 EST insulin  aspart (novoLOG ) injection 0-6 Units -- Subcutaneous Automatically Held Transfer Provider, Automatic --    08/27/2024 1700 EST insulin  aspart (novoLOG ) injection 0-6 Units -- Subcutaneous Automatically Held Transfer Provider, Automatic --    08/27/2024 1200 EST insulin  aspart (novoLOG ) injection 0-6 Units -- Subcutaneous Automatically Held Transfer Provider, Automatic --    08/27/2024 0800 EST insulin  aspart (novoLOG ) injection 0-6 Units -- Subcutaneous Automatically Held Transfer Provider, Automatic --    08/26/2024 1700 EST insulin  aspart (novoLOG ) injection 0-6 Units -- Subcutaneous Automatically Held Transfer Provider, Automatic --    08/26/2024 1200 EST insulin  aspart (novoLOG ) injection 0-6 Units --  Subcutaneous Automatically Held Transfer Provider, Automatic --    08/26/2024 0800 EST insulin  aspart (novoLOG ) injection 0-6 Units -- Subcutaneous Automatically Held Transfer Provider, Automatic --    08/25/2024 1700 EST insulin  aspart (novoLOG ) injection 0-6 Units -- Subcutaneous Automatically Held Transfer Provider, Automatic --    08/25/2024 1200 EST insulin  aspart (novoLOG ) injection 0-6 Units -- Subcutaneous Automatically Held Transfer Provider, Automatic --    08/25/2024 0800 EST insulin  aspart (novoLOG ) injection 0-6 Units -- Subcutaneous Automatically Held Transfer Provider, Automatic --    08/24/2024 1700 EST insulin  aspart (novoLOG ) injection 0-6 Units -- Subcutaneous Automatically  Held Location Manager, Automatic --    08/24/2024 1200 EST insulin  aspart (novoLOG ) injection 0-6 Units -- Subcutaneous Automatically Held Transfer Provider, Automatic --    08/24/2024 0800 EST insulin  aspart (novoLOG ) injection 0-6 Units -- Subcutaneous Automatically Held Transfer Provider, Automatic --    08/23/2024 1700 EST insulin  aspart (novoLOG ) injection 0-6 Units -- Subcutaneous Automatically Held Transfer Provider, Automatic --    08/23/2024 1200 EST insulin  aspart (novoLOG ) injection 0-6 Units -- Subcutaneous Automatically Held Transfer Provider, Automatic --    08/23/2024 0800 EST insulin  aspart (novoLOG ) injection 0-6 Units -- Subcutaneous Automatically Held Transfer Provider, Automatic --    08/22/2024 1700 EST insulin  aspart (novoLOG ) injection 0-6 Units -- Subcutaneous Automatically Held Transfer Provider, Automatic --    08/22/2024 1200 EST insulin  aspart (novoLOG ) injection 0-6 Units -- Subcutaneous Automatically Held Transfer Provider, Automatic --    08/22/2024 0800 EST insulin  aspart (novoLOG ) injection 0-6 Units -- Subcutaneous Automatically Held Transfer Provider, Automatic --    08/21/2024 1700 EST insulin  aspart (novoLOG ) injection 0-6 Units -- Subcutaneous Automatically Held Transfer  Provider, Automatic --    08/21/2024 1200 EST insulin  aspart (novoLOG ) injection 0-6 Units -- Subcutaneous Automatically Held Transfer Provider, Automatic --    08/21/2024 0800 EST insulin  aspart (novoLOG ) injection 0-6 Units -- Subcutaneous Automatically Held Transfer Provider, Automatic --    08/20/2024 1700 EST insulin  aspart (novoLOG ) injection 0-6 Units -- Subcutaneous Automatically Held Transfer Provider, Automatic --    08/20/2024 1200 EST insulin  aspart (novoLOG ) injection 0-6 Units -- Subcutaneous Automatically Held Transfer Provider, Automatic --    08/20/2024 0800 EST insulin  aspart (novoLOG ) injection 0-6 Units -- Subcutaneous Automatically Held Transfer Provider, Automatic --    08/19/2024 1700 EST insulin  aspart (novoLOG ) injection 0-6 Units -- Subcutaneous Automatically Held Transfer Provider, Automatic --    08/19/2024 1200 EST insulin  aspart (novoLOG ) injection 0-6 Units -- Subcutaneous Automatically Held Transfer Provider, Automatic --    08/19/2024 0800 EST insulin  aspart (novoLOG ) injection 0-6 Units -- Subcutaneous Automatically Held Transfer Provider, Automatic --    08/18/2024 1700 EST insulin  aspart (novoLOG ) injection 0-6 Units -- Subcutaneous Automatically Held Transfer Provider, Automatic --    08/18/2024 1200 EST insulin  aspart (novoLOG ) injection 0-6 Units -- Subcutaneous Automatically Held Transfer Provider, Automatic --    08/18/2024 0800 EST insulin  aspart (novoLOG ) injection 0-6 Units -- Subcutaneous Automatically Held Transfer Provider, Automatic --    08/17/2024 1700 EST insulin  aspart (novoLOG ) injection 0-6 Units -- Subcutaneous Automatically Held Transfer Provider, Automatic --    08/17/2024 1200 EST insulin  aspart (novoLOG ) injection 0-6 Units -- Subcutaneous Automatically Held Transfer Provider, Automatic --    08/17/2024 0809 EST insulin  aspart (novoLOG ) injection 0-6 Units -- Subcutaneous MAR Hold Transfer Provider, Automatic --    08/17/2024 0835 EST insulin   aspart (novoLOG ) injection 10 Units 10 Units Subcutaneous Given Trudy Lenice NOVAK, RN --    09/01/2024 1000 EST insulin  glargine (LANTUS ) injection 8 Units -- Subcutaneous Automatically Held Transfer Provider, Automatic --    08/31/2024 1000 EST insulin  glargine (LANTUS ) injection 8 Units -- Subcutaneous Automatically Held Transfer Provider, Automatic --    08/30/2024 1000 EST insulin  glargine (LANTUS ) injection 8 Units -- Subcutaneous Automatically Held Transfer Provider, Automatic --    08/29/2024 1000 EST insulin  glargine (LANTUS ) injection 8 Units -- Subcutaneous Automatically Held Transfer Provider, Automatic --    08/28/2024 1000 EST insulin  glargine (LANTUS ) injection 8 Units -- Subcutaneous Automatically Held Transfer Provider, Automatic --    08/27/2024 1000 EST insulin  glargine (LANTUS ) injection 8 Units --  Subcutaneous Automatically Held Location Manager, Automatic --    08/26/2024 1000 EST insulin  glargine (LANTUS ) injection 8 Units -- Subcutaneous Automatically Held Transfer Provider, Automatic --    08/25/2024 1000 EST insulin  glargine (LANTUS ) injection 8 Units -- Subcutaneous Automatically Held Transfer Provider, Automatic --    08/24/2024 1000 EST insulin  glargine (LANTUS ) injection 8 Units -- Subcutaneous Automatically Held Transfer Provider, Automatic --    08/23/2024 1000 EST insulin  glargine (LANTUS ) injection 8 Units -- Subcutaneous Automatically Held Transfer Provider, Automatic --    08/22/2024 1000 EST insulin  glargine (LANTUS ) injection 8 Units -- Subcutaneous Automatically Held Transfer Provider, Automatic --    08/21/2024 1000 EST insulin  glargine (LANTUS ) injection 8 Units -- Subcutaneous Automatically Held Transfer Provider, Automatic --    08/20/2024 1000 EST insulin  glargine (LANTUS ) injection 8 Units -- Subcutaneous Automatically Held Transfer Provider, Automatic --    08/19/2024 1000 EST insulin  glargine (LANTUS ) injection 8 Units -- Subcutaneous Automatically Held  Transfer Provider, Automatic --    08/18/2024 1000 EST insulin  glargine (LANTUS ) injection 8 Units -- Subcutaneous Automatically Held Transfer Provider, Automatic --    08/17/2024 1000 EST insulin  glargine (LANTUS ) injection 8 Units -- Subcutaneous Automatically Held Transfer Provider, Automatic --    08/17/2024 0809 EST insulin  glargine (LANTUS ) injection 8 Units -- Subcutaneous MAR Hold Transfer Provider, Automatic --    08/17/2024 1105 EST iodixanol  (VISIPAQUE ) 320 MG/ML injection 45 mL Intra-arterial Given Marea Selinda RAMAN, MD --    08/27/2024 1000 EST irbesartan  (AVAPRO ) tablet 300 mg -- Oral Automatically Held Transfer Provider, Automatic --    08/26/2024 1000 EST irbesartan  (AVAPRO ) tablet 300 mg -- Oral Automatically Held Transfer Provider, Automatic --    08/25/2024 1000 EST irbesartan  (AVAPRO ) tablet 300 mg -- Oral Automatically Held Transfer Provider, Automatic --    08/24/2024 1000 EST irbesartan  (AVAPRO ) tablet 300 mg -- Oral Automatically Held Transfer Provider, Automatic --    08/23/2024 1000 EST irbesartan  (AVAPRO ) tablet 300 mg -- Oral Automatically Held Transfer Provider, Automatic --    08/22/2024 1000 EST irbesartan  (AVAPRO ) tablet 300 mg -- Oral Automatically Held Transfer Provider, Automatic --    08/21/2024 1000 EST irbesartan  (AVAPRO ) tablet 300 mg -- Oral Automatically Held Transfer Provider, Automatic --    08/20/2024 1000 EST irbesartan  (AVAPRO ) tablet 300 mg -- Oral Automatically Held Transfer Provider, Automatic --    08/19/2024 1000 EST irbesartan  (AVAPRO ) tablet 300 mg -- Oral Automatically Held Transfer Provider, Automatic --    08/18/2024 1000 EST irbesartan  (AVAPRO ) tablet 300 mg -- Oral Automatically Held Transfer Provider, Automatic --    08/17/2024 1000 EST irbesartan  (AVAPRO ) tablet 300 mg -- Oral Automatically Held Transfer Provider, Automatic --    08/17/2024 0809 EST irbesartan  (AVAPRO ) tablet 300 mg -- Oral MAR Hold Transfer Provider, Automatic --    08/17/2024  0849 EST lidocaine  (cardiac) 100 mg/10mL (XYLOCAINE ) injection 2% 100 mg Intravenous Given Jestine Palma, CRNA --    08/17/2024 0809 EST morphine  (PF) 2 MG/ML injection 2 mg -- Intravenous MAR Hold Transfer Provider, Automatic --    08/17/2024 1142 EST ondansetron  (ZOFRAN ) injection 4 mg Intravenous Given Jaylene Nest, CRNA --    08/17/2024 0809 EST ondansetron  (ZOFRAN ) injection 4 mg -- Intravenous [facility] Hold Transfer Provider, Automatic --    08/17/2024 0809 EST ondansetron  (ZOFRAN ) tablet 4 mg -- Oral MAR Hold Transfer Provider, Automatic --    08/27/2024 1000 EST pantoprazole  (PROTONIX ) EC tablet 40 mg -- Oral Automatically Held Transfer Provider, Automatic --    08/26/2024 1000 EST  pantoprazole  (PROTONIX ) EC tablet 40 mg -- Oral Automatically Held Transfer Provider, Automatic --    08/25/2024 1000 EST pantoprazole  (PROTONIX ) EC tablet 40 mg -- Oral Automatically Held Transfer Provider, Automatic --    08/24/2024 1000 EST pantoprazole  (PROTONIX ) EC tablet 40 mg -- Oral Automatically Held Transfer Provider, Automatic --    08/23/2024 1000 EST pantoprazole  (PROTONIX ) EC tablet 40 mg -- Oral Automatically Held Transfer Provider, Automatic --    08/22/2024 1000 EST pantoprazole  (PROTONIX ) EC tablet 40 mg -- Oral Automatically Held Transfer Provider, Automatic --    08/21/2024 1000 EST pantoprazole  (PROTONIX ) EC tablet 40 mg -- Oral Automatically Held Transfer Provider, Automatic --    08/20/2024 1000 EST pantoprazole  (PROTONIX ) EC tablet 40 mg -- Oral Automatically Held Transfer Provider, Automatic --    08/19/2024 1000 EST pantoprazole  (PROTONIX ) EC tablet 40 mg -- Oral Automatically Held Transfer Provider, Automatic --    08/18/2024 1000 EST pantoprazole  (PROTONIX ) EC tablet 40 mg -- Oral Automatically Held Transfer Provider, Automatic --    08/17/2024 1000 EST pantoprazole  (PROTONIX ) EC tablet 40 mg -- Oral Automatically Held Transfer Provider, Automatic --    08/17/2024 0809 EST pantoprazole   (PROTONIX ) EC tablet 40 mg -- Oral MAR Hold Transfer Provider, Automatic --    08/17/2024 1143 EST phenylephrine  (NEO-SYNEPHRINE) 20mg /NS premix infusion 0 mcg/min Intravenous Stopped Jaylene Nest, CRNA --    08/17/2024 1132 EST phenylephrine  (NEO-SYNEPHRINE) 20mg /NS premix infusion 20 mcg/min Intravenous Restarted Jaylene Nest, CRNA --    08/17/2024 1122 EST phenylephrine  (NEO-SYNEPHRINE) 20mg /NS premix infusion 0 mcg/min Intravenous Stopped Jaylene Nest, CRNA --    08/17/2024 1024 EST phenylephrine  (NEO-SYNEPHRINE) 20mg /NS premix infusion 30 mcg/min Intravenous Rate/Dose Change Jestine Palma, CRNA --    08/17/2024 1001 EST phenylephrine  (NEO-SYNEPHRINE) 20mg /NS premix infusion 20 mcg/min Intravenous Rate/Dose Change Jestine Palma, CRNA --    08/17/2024 0936 EST phenylephrine  (NEO-SYNEPHRINE) 20mg /NS premix infusion 30 mcg/min Intravenous Rate/Dose Change Jestine Palma, CRNA --    08/17/2024 0915 EST phenylephrine  (NEO-SYNEPHRINE) 20mg /NS premix infusion 20 mcg/min Intravenous Rate/Dose Change Jestine Palma, CRNA --    08/17/2024 0903 EST phenylephrine  (NEO-SYNEPHRINE) 20mg /NS premix infusion 30 mcg/min Intravenous New Bag/Given Jestine Palma, CRNA --    08/17/2024 1114 EST PHENYLephrine  80 mcg/ml in normal saline Adult IV Push Syringe (For Blood Pressure Support) 80 mcg Intravenous Given Jestine Palma, CRNA --    08/17/2024 1100 EST PHENYLephrine  80 mcg/ml in normal saline Adult IV Push Syringe (For Blood Pressure Support) 80 mcg Intravenous Given Jestine Palma, CRNA --    08/17/2024 0902 EST PHENYLephrine  80 mcg/ml in normal saline Adult IV Push Syringe (For Blood Pressure Support) 160 mcg Intravenous Given Jestine Palma, CRNA --    08/17/2024 0851 EST PHENYLephrine  80 mcg/ml in normal saline Adult IV Push Syringe (For Blood Pressure Support) 160 mcg Intravenous Given Jestine Palma, CRNA --    08/27/2024 2000 EST piperacillin -tazobactam  (ZOSYN ) IVPB 3.375 g -- Intravenous Automatically Held Transfer Provider, Automatic --    08/27/2024 1200 EST piperacillin -tazobactam (ZOSYN ) IVPB 3.375 g -- Intravenous Automatically Held Transfer Provider, Automatic --    08/27/2024 0400 EST piperacillin -tazobactam (ZOSYN ) IVPB 3.375 g -- Intravenous Automatically Held Transfer Provider, Automatic --    08/26/2024 2000 EST piperacillin -tazobactam (ZOSYN ) IVPB 3.375 g -- Intravenous Automatically Held Transfer Provider, Automatic --    08/26/2024 1200 EST piperacillin -tazobactam (ZOSYN ) IVPB 3.375 g -- Intravenous Automatically Held Transfer Provider, Automatic --    08/26/2024 0400 EST piperacillin -tazobactam (ZOSYN ) IVPB 3.375 g --  Intravenous Automatically Held Location Manager, Automatic --    08/25/2024 2000 EST piperacillin -tazobactam (ZOSYN ) IVPB 3.375 g -- Intravenous Automatically Held Transfer Provider, Automatic --    08/25/2024 1200 EST piperacillin -tazobactam (ZOSYN ) IVPB 3.375 g -- Intravenous Automatically Held Location Manager, Automatic --    08/25/2024 0400 EST piperacillin -tazobactam (ZOSYN ) IVPB 3.375 g -- Intravenous Automatically Held Transfer Provider, Automatic --    08/24/2024 2000 EST piperacillin -tazobactam (ZOSYN ) IVPB 3.375 g -- Intravenous Automatically Held Transfer Provider, Automatic --    08/24/2024 1200 EST piperacillin -tazobactam (ZOSYN ) IVPB 3.375 g -- Intravenous Automatically Held Transfer Provider, Automatic --    08/24/2024 0400 EST piperacillin -tazobactam (ZOSYN ) IVPB 3.375 g -- Intravenous Automatically Held Transfer Provider, Automatic --    08/23/2024 2000 EST piperacillin -tazobactam (ZOSYN ) IVPB 3.375 g -- Intravenous Automatically Held Transfer Provider, Automatic --    08/23/2024 1200 EST piperacillin -tazobactam (ZOSYN ) IVPB 3.375 g -- Intravenous Automatically Held Transfer Provider, Automatic --    08/23/2024 0400 EST piperacillin -tazobactam (ZOSYN ) IVPB 3.375 g -- Intravenous Automatically Held  Transfer Provider, Automatic --    08/22/2024 2000 EST piperacillin -tazobactam (ZOSYN ) IVPB 3.375 g -- Intravenous Automatically Held Transfer Provider, Automatic --    08/22/2024 1200 EST piperacillin -tazobactam (ZOSYN ) IVPB 3.375 g -- Intravenous Automatically Held Transfer Provider, Automatic --    08/22/2024 0400 EST piperacillin -tazobactam (ZOSYN ) IVPB 3.375 g -- Intravenous Automatically Held Transfer Provider, Automatic --    08/21/2024 2000 EST piperacillin -tazobactam (ZOSYN ) IVPB 3.375 g -- Intravenous Automatically Held Transfer Provider, Automatic --    08/21/2024 1200 EST piperacillin -tazobactam (ZOSYN ) IVPB 3.375 g -- Intravenous Automatically Held Transfer Provider, Automatic --    08/21/2024 0400 EST piperacillin -tazobactam (ZOSYN ) IVPB 3.375 g -- Intravenous Automatically Held Transfer Provider, Automatic --    08/20/2024 2000 EST piperacillin -tazobactam (ZOSYN ) IVPB 3.375 g -- Intravenous Automatically Held Transfer Provider, Automatic --    08/20/2024 1200 EST piperacillin -tazobactam (ZOSYN ) IVPB 3.375 g -- Intravenous Automatically Held Transfer Provider, Automatic --    08/20/2024 0400 EST piperacillin -tazobactam (ZOSYN ) IVPB 3.375 g -- Intravenous Automatically Held Transfer Provider, Automatic --    08/19/2024 2000 EST piperacillin -tazobactam (ZOSYN ) IVPB 3.375 g -- Intravenous Automatically Held Transfer Provider, Automatic --    08/19/2024 1200 EST piperacillin -tazobactam (ZOSYN ) IVPB 3.375 g -- Intravenous Automatically Held Transfer Provider, Automatic --    08/19/2024 0400 EST piperacillin -tazobactam (ZOSYN ) IVPB 3.375 g -- Intravenous Automatically Held Transfer Provider, Automatic --    08/18/2024 2000 EST piperacillin -tazobactam (ZOSYN ) IVPB 3.375 g -- Intravenous Automatically Held Transfer Provider, Automatic --    08/18/2024 1200 EST piperacillin -tazobactam (ZOSYN ) IVPB 3.375 g -- Intravenous Automatically Held Transfer Provider, Automatic --    08/18/2024 0400 EST  piperacillin -tazobactam (ZOSYN ) IVPB 3.375 g -- Intravenous Automatically Held Transfer Provider, Automatic --    08/17/2024 2000 EST piperacillin -tazobactam (ZOSYN ) IVPB 3.375 g -- Intravenous Automatically Held Transfer Provider, Automatic --    08/17/2024 1200 EST piperacillin -tazobactam (ZOSYN ) IVPB 3.375 g -- Intravenous Automatically Held Transfer Provider, Automatic --    08/17/2024 0830 EST piperacillin -tazobactam (ZOSYN ) IVPB 3.375 g 3.375 g Intravenous Given Jestine Palma, CRNA --    08/17/2024 0809 EST piperacillin -tazobactam (ZOSYN ) IVPB 3.375 g -- Intravenous MAR Hold Transfer Provider, Automatic --    08/17/2024 0849 EST propofol  (DIPRIVAN ) 10 mg/mL bolus/IV push 140 mg Intravenous Given Jestine Palma, CRNA --    08/17/2024 1042 EST rocuronium  (ZEMURON ) injection 20 mg Intravenous Given Jestine Palma, CRNA --    08/17/2024 0934 EST rocuronium  (ZEMURON ) injection 20 mg Intravenous Given Jestine Palma, CRNA --    08/17/2024 [phone]  EST rocuronium  (ZEMURON ) injection 50 mg Intravenous Given Jestine Palma, CRNA --    08/17/2024 [phone] EST sodium chloride  0.9 % 500 mL with heparin  5,000 Units -- Irrigation Given Marea Selinda RAMAN, MD --    09/01/2024 2200 EST sodium chloride  flush (NS) 0.9 % injection 3 mL -- Intravenous Automatically Held Transfer Provider, Automatic --    09/01/2024 1000 EST sodium chloride  flush (NS) 0.9 % injection 3 mL -- Intravenous Automatically Solicitor, Automatic --    08/31/2024 2200 EST sodium chloride  flush (NS) 0.9 % injection 3 mL -- Intravenous Automatically Held Location Manager, Automatic --    08/31/2024 1000 EST sodium chloride  flush (NS) 0.9 % injection 3 mL -- Intravenous Automatically Solicitor, Automatic --    08/30/2024 2200 EST sodium chloride  flush (NS) 0.9 % injection 3 mL -- Intravenous Automatically Held Location Manager, Automatic --    08/30/2024 1000 EST sodium chloride  flush (NS) 0.9 % injection 3 mL -- Intravenous  Automatically Held Location Manager, Automatic --    08/29/2024 2200 EST sodium chloride  flush (NS) 0.9 % injection 3 mL -- Intravenous Automatically Held Location Manager, Automatic --    08/29/2024 1000 EST sodium chloride  flush (NS) 0.9 % injection 3 mL -- Intravenous Automatically Solicitor, Automatic --    08/28/2024 2200 EST sodium chloride  flush (NS) 0.9 % injection 3 mL -- Intravenous Automatically Held Location Manager, Automatic --    08/28/2024 1000 EST sodium chloride  flush (NS) 0.9 % injection 3 mL -- Intravenous Automatically Solicitor, Automatic --    08/27/2024 2200 EST sodium chloride  flush (NS) 0.9 % injection 3 mL -- Intravenous Automatically Held Location Manager, Automatic --    08/27/2024 1000 EST sodium chloride  flush (NS) 0.9 % injection 3 mL -- Intravenous Automatically Solicitor, Automatic --    08/26/2024 2200 EST sodium chloride  flush (NS) 0.9 % injection 3 mL -- Intravenous Automatically Held Location Manager, Automatic --    08/26/2024 1000 EST sodium chloride  flush (NS) 0.9 % injection 3 mL -- Intravenous Automatically Solicitor, Automatic --    08/25/2024 2200 EST sodium chloride  flush (NS) 0.9 % injection 3 mL -- Intravenous Automatically Held Location Manager, Automatic --    08/25/2024 1000 EST sodium chloride  flush (NS) 0.9 % injection 3 mL -- Intravenous Automatically Solicitor, Automatic --    08/24/2024 2200 EST sodium chloride  flush (NS) 0.9 % injection 3 mL -- Intravenous Automatically Held Location Manager, Automatic --    08/24/2024 1000 EST sodium chloride  flush (NS) 0.9 % injection 3 mL -- Intravenous Automatically Solicitor, Automatic --    08/23/2024 2200 EST sodium chloride  flush (NS) 0.9 % injection 3 mL -- Intravenous Automatically Held Location Manager, Automatic --    08/23/2024 1000 EST sodium chloride  flush (NS) 0.9 % injection 3 mL -- Intravenous Automatically  Solicitor, Automatic --    08/22/2024 2200 EST sodium chloride  flush (NS) 0.9 % injection 3 mL -- Intravenous Automatically Solicitor, Automatic --    08/22/2024 1000 EST sodium chloride  flush (NS) 0.9 % injection 3 mL -- Intravenous Automatically Solicitor, Automatic --    08/21/2024 2200 EST sodium chloride  flush (NS) 0.9 % injection 3 mL -- Intravenous Automatically Held Transfer Provider, Automatic --    08/21/2024 1000 EST sodium chloride  flush (NS) 0.9 % injection 3 mL -- Intravenous Automatically Held Transfer Provider, Automatic --    08/20/2024 2200 EST sodium chloride  flush (NS)  0.9 % injection 3 mL -- Intravenous Automatically Held Location Manager, Automatic --    08/20/2024 1000 EST sodium chloride  flush (NS) 0.9 % injection 3 mL -- Intravenous Automatically Held Location Manager, Automatic --    08/19/2024 2200 EST sodium chloride  flush (NS) 0.9 % injection 3 mL -- Intravenous Automatically Held Location Manager, Automatic --    08/19/2024 1000 EST sodium chloride  flush (NS) 0.9 % injection 3 mL -- Intravenous Automatically Held Location Manager, Automatic --    08/18/2024 2200 EST sodium chloride  flush (NS) 0.9 % injection 3 mL -- Intravenous Automatically Held Location Manager, Automatic --    08/18/2024 1000 EST sodium chloride  flush (NS) 0.9 % injection 3 mL -- Intravenous Automatically Held Location Manager, Automatic --    08/17/2024 2200 EST sodium chloride  flush (NS) 0.9 % injection 3 mL -- Intravenous Automatically Held Location Manager, Automatic --    08/17/2024 1000 EST sodium chloride  flush (NS) 0.9 % injection 3 mL -- Intravenous Automatically Held Location Manager, Automatic --    08/17/2024 0809 EST sodium chloride  flush (NS) 0.9 % injection 3 mL -- Intravenous MAR Hold Transfer Provider, Automatic --    08/17/2024 0809 EST sodium chloride  flush (NS) 0.9 % injection 3 mL -- Intravenous MAR Hold Transfer Provider, Automatic --     08/27/2024 1000 EST spironolactone  (ALDACTONE ) tablet 25 mg -- Oral Automatically Held Transfer Provider, Automatic --    08/26/2024 1000 EST spironolactone  (ALDACTONE ) tablet 25 mg -- Oral Automatically Held Transfer Provider, Automatic --    08/25/2024 1000 EST spironolactone  (ALDACTONE ) tablet 25 mg -- Oral Automatically Held Transfer Provider, Automatic --    08/24/2024 1000 EST spironolactone  (ALDACTONE ) tablet 25 mg -- Oral Automatically Held Transfer Provider, Automatic --    08/23/2024 1000 EST spironolactone  (ALDACTONE ) tablet 25 mg -- Oral Automatically Held Transfer Provider, Automatic --    08/22/2024 1000 EST spironolactone  (ALDACTONE ) tablet 25 mg -- Oral Automatically Held Transfer Provider, Automatic --    08/21/2024 1000 EST spironolactone  (ALDACTONE ) tablet 25 mg -- Oral Automatically Held Transfer Provider, Automatic --    08/20/2024 1000 EST spironolactone  (ALDACTONE ) tablet 25 mg -- Oral Automatically Held Transfer Provider, Automatic --    08/19/2024 1000 EST spironolactone  (ALDACTONE ) tablet 25 mg -- Oral Automatically Held Transfer Provider, Automatic --    08/18/2024 1000 EST spironolactone  (ALDACTONE ) tablet 25 mg -- Oral Automatically Held Transfer Provider, Automatic --    08/17/2024 1000 EST spironolactone  (ALDACTONE ) tablet 25 mg -- Oral Automatically Held Transfer Provider, Automatic --    08/17/2024 0809 EST spironolactone  (ALDACTONE ) tablet 25 mg -- Oral MAR Hold Transfer Provider, Automatic --    08/17/2024 0957 EST sterile water  for irrigation 1,000 mL Irrigation Given Marea Selinda RAMAN, MD on back table    08/17/2024 1144 EST sugammadex  sodium (BRIDION ) injection 200 mg Intravenous Given Jaylene Nest, CRNA --    08/17/2024 0904 EST vancomycin  (VANCOCIN ) IVPB 1000 mg/200 mL premix 1,000 mg Intravenous Given Jestine Palma, CRNA --    08/17/2024 1128 EST vancomycin  (VANCOCIN ) powder 1,000 mg Used in bone cement Given Marea Selinda RAMAN, MD MIXED WITH STIMULAN BEADS     08/17/2024 1125 EST Vashe Wound Irrigation Optime 34 oz Irrigation Given Marea Selinda RAMAN, MD --    08/17/2024 0904 EST vasopressin  (PITRESSIN) 20 UNIT/ML injection 1 Units Intravenous Given Jestine Palma, CRNA --       Mobility {Mobility:20148}     [1]  Allergies Allergen Reactions   Cefepime  Hives    Had allergic reaction to  one of these 3 agents, unclear which one   Metoprolol  Hives    Had allergic reaction to one of these 3 agents, unclear which one  *Per RN, highly likely this is the cause of allergic rxn   Myrbetriq  [Mirabegron ] Hives    Had allergic reaction to one of these 3 agents, unclear which one

## 2024-08-17 NOTE — TOC Progression Note (Signed)
 Transition of Care [facility]) - Progression Note    Patient Details  Name: [name] MRN: [id number] Date of Birth: 1955/12/30  [facility] CM/SW Contact  Lauraine JAYSON Carpen, LCSW Phone Number: 08/17/2024, 9:48 AM  Clinical Narrative:   Left voicemail for [facility] with [facility] [phone]).  Expected Discharge Plan and Services                                               Social Drivers of Health (SDOH) Interventions SDOH Screenings   Food Insecurity: Food Insecurity Present (08/11/2024)  Housing: High Risk (08/11/2024)  Transportation Needs: Unmet Transportation Needs (08/11/2024)  Utilities: At Risk (08/15/2024)  Alcohol Screen: Low Risk (06/10/2023)  Depression (PHQ2-9): Low Risk (07/05/2024)  Financial Resource Strain: High Risk (03/11/2024)  Physical Activity: Inactive (06/10/2023)  Social Connections: Socially Isolated (08/11/2024)  Stress: Stress Concern Present (03/11/2024)  Tobacco Use: High Risk (08/10/2024)  Health Literacy: Inadequate Health Literacy (02/17/2024)    Readmission Risk Interventions    03/30/2024    2:55 PM 03/09/2024    3:21 PM 12/30/2023    1:17 PM  Readmission Risk Prevention Plan  Post Dischage Appt   Complete  Medication Screening   Complete  Transportation Screening Complete Complete Complete  PCP or Specialist Appt within 5-7 Days  Complete   PCP or Specialist Appt within 3-5 Days Complete    Home Care Screening  Complete   Medication Review (RN CM)  Complete   HRI or Home Care Consult Complete    Social Work Consult for Recovery Care Planning/Counseling Complete    Palliative Care Screening Complete    Medication Review Oceanographer) Referral to Pharmacy

## 2024-08-17 NOTE — Plan of Care (Signed)
" °  Problem: Education: Goal: Knowledge of General Education information will improve Description: Including pain rating scale, medication(s)/side effects and non-pharmacologic comfort measures Outcome: Progressing   Problem: Health Behavior/Discharge Planning: Goal: Ability to manage health-related needs will improve Outcome: Progressing   Problem: Clinical Measurements: Goal: Ability to maintain clinical measurements within normal limits will improve Outcome: Progressing Goal: Will remain free from infection Outcome: Progressing Goal: Diagnostic test results will improve Outcome: Progressing Goal: Respiratory complications will improve Outcome: Progressing Goal: Cardiovascular complication will be avoided Outcome: Progressing   Problem: Activity: Goal: Risk for activity intolerance will decrease Outcome: Progressing   Problem: Nutrition: Goal: Adequate nutrition will be maintained Outcome: Progressing   Problem: Coping: Goal: Level of anxiety will decrease Outcome: Progressing   Problem: Elimination: Goal: Will not experience complications related to bowel motility Outcome: Progressing Goal: Will not experience complications related to urinary retention Outcome: Progressing   Problem: Pain Managment: Goal: General experience of comfort will improve and/or be controlled Outcome: Progressing   Problem: Safety: Goal: Ability to remain free from injury will improve Outcome: Progressing   Problem: Skin Integrity: Goal: Risk for impaired skin integrity will decrease Outcome: Progressing   Problem: Clinical Measurements: Goal: Ability to avoid or minimize complications of infection will improve Outcome: Progressing   Problem: Education: Goal: Ability to describe self-care measures that may prevent or decrease complications (Diabetes Survival Skills Education) will improve Outcome: Progressing Goal: Individualized Educational Video(s) Outcome: Progressing    Problem: Coping: Goal: Ability to adjust to condition or change in health will improve Outcome: Progressing   Problem: Health Behavior/Discharge Planning: Goal: Ability to identify and utilize available resources and services will improve Outcome: Progressing Goal: Ability to manage health-related needs will improve Outcome: Progressing   Problem: Metabolic: Goal: Ability to maintain appropriate glucose levels will improve Outcome: Progressing   Problem: Nutritional: Goal: Maintenance of adequate nutrition will improve Outcome: Progressing Goal: Progress toward achieving an optimal weight will improve Outcome: Progressing   Problem: Education: Goal: Understanding of CV disease, CV risk reduction, and recovery process will improve Outcome: Progressing Goal: Individualized Educational Video(s) Outcome: Progressing   Problem: Activity: Goal: Ability to return to baseline activity level will improve Outcome: Progressing   Problem: Health Behavior/Discharge Planning: Goal: Ability to safely manage health-related needs after discharge will improve Outcome: Progressing   Problem: Education: Goal: Knowledge of the prescribed therapeutic regimen will improve Outcome: Progressing   Problem: Cardiac: Goal: Ability to maintain an adequate cardiac output Outcome: Progressing Goal: Will show no evidence of cardiac arrhythmias Outcome: Progressing   "

## 2024-08-17 NOTE — Op Note (Signed)
 OPERATIVE NOTE   PROCEDURE: 1.   Left common femoral, profunda femoris, and superficial femoral artery endarterectomies and patch angioplasty 2.   Left lower extremity angiogram 3.   Mechanical thrombectomy to the left SFA and was proximal popliteal artery with the 8 French Rota Rex device 4.   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  significant plaque in left common femoral, profunda femoris, and superficial femoral arteries 2.  Occlusion with thrombosis of the previously placed left SFA stents with reconstitution of the above-knee popliteal artery and three-vessel runoff distally  SPECIMEN(S):  Left common femoral, profunda femoris, and superficial femoral artery plaque.  INDICATIONS:    Patient presents with profound malperfusion of the left lower extremity with nonhealing ulceration and infection worrisome for limb loss.  Left femoral endarterectomy as well as left SFA intervention is planned to try to improve perfusion.  The risks and benefits as well as alternative therapies including intervention were reviewed in detail all questions were answered the patient agrees to proceed with surgery.  DESCRIPTION: After obtaining full informed written consent, the patient was brought back to the operating room and placed supine upon the operating table.  The patient received IV antibiotics prior to induction.  After obtaining adequate anesthesia, the patient was prepped and draped in the standard fashion appropriate time out is called.    Vertical incision was created overlying the left femoral arteries. The common femoral artery  proximally, and superficial femoral artery, and primary profunda femoris artery branches were encircled with vessel loops and prepared for control. The left femoral arteries were found to have significant plaque from the common femoral artery into the profunda and superficial femoral arteries.   6000 units of heparin  was given and allowed circulate for 5 minutes.  An additional 2000 units of heparin  were given at 1 hour  Attention is then turned to the left femoral artery.  An arteriotomy is made with 11 blade and extended with Potts scissors in the common femoral artery and carried down onto the first 1-2 cm of the superficial femoral artery. An endarterectomy was then performed. The [facility] was used to create a plane. The proximal endpoint was cut flush with tenotomy scissors. This was in the proximal common femoral artery. An eversion endarterectomy was then performed for the first 2-3 cm of the profunda femoris artery and a nice feathered endpoint was achieved. Good backbleeding was then seen from the profunda femoris artery. The distal endpoint of the superficial femoral endarterectomy was created with gentle traction and the distal endpoint was tacked down with three 7-0 Prolenes.  The bovine pericardial patcth is then selected and prepared for a patch angioplasty.  It is cut and beveled and started at the proximal endpoint with a 6-0 Prolene suture.  Approximately one half of the suture line is run medially and laterally and the distal end point was cut and bevelled to match the arteriotomy.  A second 6-0 Prolene was started at the distal end point and run to the mid portion to complete the arteriotomy, leaving a small gap to place an 8 French sheath for the endovascular portion of  the procedure.  An 8 French sheath was then placed in an antegrade fashion down the proximal SFA.  Left lower extremity imaging was then performed.  There was occlusion with thrombosis of the previously placed SFA  stents with reconstitution of the above-knee popliteal artery.  There was then three-vessel runoff distally.  The profunda femoris artery and proximal superficial femoral artery after endarterectomy could be seen to be widely patent after the angiogram.  I then easily cross the thrombotic occlusion with an advantage wire and a Kumpe catheter and exchanged for a V18 wire.  I then used the 8 French Rota Rex device to perform mechanical thrombectomy with 2 passes made in the left SFA and was proximal popliteal artery.  This resulted in marked improvement now with a flow channel with intimal hyperplasia creating high-grade residual stenosis at the proximal edge of the previously placed stent and there was still severe disease at the distal edge of the previously placed stent with short segment occlusion and small amount of residual thrombus.  I elected to place covered stents to realign the old stents and treat the disease above and below the previously placed stents.  A 6 mm diameter by 15 cm length Viabahn stent was started in the above-knee popliteal artery and brought up to the mid SFA and then a 7 mm diameter by 15 cm length Viabahn stent was placed in the proximal to mid SFA bridging down to the 6 mm stent with about 1 to 2 cm of overlap.  These were postdilated with 5 mm balloon distally and 6 mm balloon proximally with excellent angiograph completion result and less than 10% residual stenosis after stent placement with preserved flow distally.  The 8 French sheath was then removed and we prepared for closure.  The vessel was flushed prior to release of control and completion of the anastomosis.  At this point, flow was established first to the profunda femoris artery and then to the superficial femoral artery. Easily palpable pulses are noted well beyond the anastomosis and both arteries.  The wound was copiously irrigated with 1 L of Vashe irrigation.  Fibrillar and hemoblast topical hemostatic agents were  placed in the femoral incision and hemostasis was complete.  I then placed gentamicin  and vancomycin  impregnated antibiotic beads into the wound and proceeded with closure.  The femoral incision was then closed in a layered fashion with 2 layers of 2-0 Vicryl, 2 layers of 3-0 Vicryl, and staples for the skin closure.  Between the deep 2 layers of 2-0 Vicryl I placed Celerate.  Sterile dressing were then placed over the incision.  The patient was then awakened from anesthesia and taken to the recovery room in stable condition having tolerated the procedure well.  COMPLICATIONS: None  CONDITION: Stable     Selinda Gu 08/17/2024 12:18 PM   This note was created with Dragon Medical transcription system. Any errors in dictation are purely unintentional.

## 2024-08-17 NOTE — Progress Notes (Signed)
 " PROGRESS NOTE    [name]  FMW:[id number] DOB: 1956/05/01 DOA: 08/10/2024 PCP: Norleen Lynwood LELON, MD  Subjective: No acute events overnight. Seen and examined at bedside after vascular procedure. Reports tolerating procedure well with some localized pain. Awaiting amputation tonight. Denies nausea, vomiting, constipation.   Hospital Course: 69 y.o. male with medical history significant for PAD, osteomyelitis s/p toe amputation, DM, HTN, bladder cancer, paroxysmal atrial fibrillation on Eliquis , , depression being admitted for cellulitis left foot with possible osteomyelitis.  He was admitted for the same in August 2025, undergoing amputation of the left great toe at that time, and he was treated with meropenem -> Cipro  to complete 4 weeks after OR cultures grew Pseudomonas, Enterobacter and Proteus.  Today he presented by EMS with pain and swelling of the left foot with redness as well as dizziness and weakness, which caused him to fall hitting his head on 1 occasion but without loss of consciousness.  He reports not having electricity in the house and cannot power his cell phone.  He decided to come into the ED to get checked out. In the ED, temp of 99.2 with otherwise normal vitals.  Labs notable for WBC 19,000 but otherwise unremarkable. EKG with no acute concerning findings Venous ultrasound negative for DVT Foot x-ray showed the following: IMPRESSION: 1. Soft tissue swelling along the plantar surface of the forefoot at the amputation site with subcutaneous gas. 2. Mild cortical irregularity along the medial margin of the remaining first metatarsal head, which could indicate osteomyelitis. 3. 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   The ED provider reached out to podiatrist, Dr.  Lamount who recommended Zosyn  and vancomycin  and get MRI.  No mention of OR  MRI of the foot did not show any convincing evidence of acute osteomyelitis at this  time.  1/2.  Patient was contemplating leaving the hospital.  I told him we need to get lab draws if were on heparin  drip. 1/3.  Sugars finally up we will restart Lantus  insulin .  Patient states that he does not have transportation at home or power.  He is interested in returning home.  Morganella resistant to all oral antibiotics. 1/4.  Staph aureus in the wound sensitive to oxacillin, Enterococcus in the wound sensitive to ampicillin .  Zosyn  would cover all of these organisms.  Discontinue vancomycin .  For angiogram tomorrow. 1/5.  Angiogram today.  Case discussed with podiatry and will need at least a transmet amputation but further discussion was may be needed depending on angiogram results. 1/6.  Cardiology planning on cardiac catheterization this afternoon for cardiac clearance for femoral endarterectomy tomorrow.   Assessment and Plan: Gangrene (HCC) Tissue on the bottom of the foot and second toe does not look good.  Status post Left common femoral, profunda femoris, and superficial femoral artery endarterectomies and patch angioplasty; Left lower extremity angiogram; Mechanical thrombectomy to the left SFA and was proximal popliteal artery with the 8 French Rota Rex device; Stent placement x 2 to the left SFA and proximal popliteal artery with 6 mm diameter by 15 cm length and 7 mm diameter by 15 cm length Viabahn stents   Plan for TMA by podiatry Vascular surgery following Podiatry following  Cellulitis of left foot Also with numerous foot wounds and starting of gangrene.   Appreciate podiatry and vascular surgery consultations.  MRI does not show any signs of osteomyelitis but tissue on the bottom of the foot  does not look good.  Wound culture growing moderate Staph aureus, Morganella and Enterococcus.   Morganella does show resistance and we do not have an oral choice.   - Patient started on aggressive antibiotics initially with vancomycin  and Zosyn .  Vancomycin  discontinued on 1/4  since Zosyn  will cover all 3 organisms.   Uncontrolled type 2 diabetes mellitus with hypoglycemia, with long-term current use of insulin  [facility]) Patient had a sugar of 38 on 08/11/2024.  Restarted lower dose of Lantus  2 days ago tjat was lowered yesterday in anticipation for surgeries.   - Continue to monitor sugars closely.    PAD (peripheral artery disease) Angiogram done on 1/5  Status post Left common femoral, profunda femoris, and superficial femoral artery endarterectomies and patch angioplasty; Left lower extremity angiogram; Mechanical thrombectomy to the left SFA and was proximal popliteal artery with the 8 French Rota Rex device; Stent placement x 2 to the left SFA and proximal popliteal artery with 6 mm diameter by 15 cm length and 7 mm diameter by 15 cm length Viabahn stents on 1/7 Vascular surgery following  PAF (paroxysmal atrial fibrillation) (HCC) Continue amiodarone  Resume eliquis  after procedures  CAD (coronary artery disease) Continue heparin , atorvastatin   Essential hypertension Continue irbesartan , spironolactone  and Lasix   Bladder cancer (HCC) Follow with oncology outpatient  Housing instability TOC following  Tobacco abuse Nicotine patch.  Patient must stop smoking.   DVT prophylaxis:   SCDs   Code Status: Full Code  Disposition Plan: TBD pending clinical course Reason for continuing need for hospitalization: severity of illness  Objective: Vitals:   08/17/24 1600 08/17/24 1656 08/17/24 1700 08/17/24 1800  BP: (!) 147/46  (!) 147/51 (!) 133/53  Pulse: 62 64 66 61  Resp: 17 18 19 18   Temp: 98 F (36.7 C)     TempSrc: Oral     SpO2: 90% 97% 95% (!) 89%  Weight:      Height:        Intake/Output Summary (Last 24 hours) at 08/17/2024 1829 Last data filed at 08/17/2024 1257 Gross per 24 hour  Intake 2015.24 ml  Output 830 ml  Net 1185.24 ml   Filed Weights   08/10/24 2055 08/15/24 1430 08/17/24 1340  Weight: 68 kg 68 kg 69.6 kg     Examination:  Physical Exam Vitals and nursing note reviewed.  Constitutional:      General: He is not in acute distress.    Appearance: He is ill-appearing (chronically).     Comments: disheveled  HENT:     Head: Normocephalic and atraumatic.  Cardiovascular:     Rate and Rhythm: Normal rate and regular rhythm.     Pulses: Normal pulses.     Heart sounds: Normal heart sounds.  Pulmonary:     Effort: Pulmonary effort is normal.     Breath sounds: Normal breath sounds.  Abdominal:     General: Bowel sounds are normal.     Palpations: Abdomen is soft.  Neurological:     Mental Status: He is alert. Mental status is at baseline.     Data Reviewed: I have personally reviewed following labs and imaging studies  CBC: Recent Labs  Lab 08/10/24 2119 08/12/24 0843 08/13/24 0706 08/14/24 0538 08/15/24 0534 08/16/24 0436 08/16/24 1455 08/16/24 1457 08/17/24 0221  WBC 19.5*   < > 11.0* 10.6* 10.7* 10.7*  --   --  11.4*  NEUTROABS 16.4*  --   --   --   --   --   --   --   --  HGB 15.1   < > 12.1* 12.2* 12.4* 12.6* 12.2* 11.9* 13.0  HCT 42.8   < > 35.4* 35.2* 36.2* 36.2* 36.0* 35.0* 38.0*  MCV 92.2   < > 92.2 91.9 93.8 92.8  --   --  93.4  PLT 308   < > 296 314 330 386  --   --  401*   < > = values in this interval not displayed.   Basic Metabolic Panel: Recent Labs  Lab 08/10/24 2119 08/13/24 0706 08/14/24 0538 08/15/24 0534 08/16/24 1455 08/16/24 1457 08/16/24 1843 08/17/24 1612  NA 134* 135  --   --  130* 134* 137 138  K 3.5 3.7  --   --  3.4* 3.5 3.8 5.0  CL 94* 98  --   --   --   --  95* 99  CO2 29 29  --   --   --   --  32 25  GLUCOSE 141* 232*  --   --   --   --  190* 230*  BUN 11 15  --   --   --   --  17 19  CREATININE 0.84 0.83 0.91 0.96  --   --  1.08 1.02  CALCIUM  9.0 8.6*  --   --   --   --  9.1 8.9   GFR: Estimated Creatinine Clearance: 69.2 mL/min (by C-G formula based on SCr of 1.02 mg/dL). Liver Function Tests: Recent Labs  Lab  08/10/24 2119  AST 22  ALT 15  ALKPHOS 143*  BILITOT 0.9  PROT 7.2  ALBUMIN  3.1*   No results for input(s): LIPASE, AMYLASE in the last 168 hours. No results for input(s): AMMONIA in the last 168 hours. Coagulation Profile: Recent Labs  Lab 08/10/24 2119  INR 1.1   Cardiac Enzymes: No results for input(s): CKTOTAL, CKMB, CKMBINDEX, TROPONINI in the last 168 hours. ProBNP, BNP (last 5 results) Recent Labs    03/08/24 2357 06/10/24 2158 07/13/24 0205  BNP 269.2* 600.6* 801.9*   HbA1C: No results for input(s): HGBA1C in the last 72 hours. CBG: Recent Labs  Lab 08/17/24 0807 08/17/24 1135 08/17/24 1201 08/17/24 1331 08/17/24 1524  GLUCAP 275* 176* 171* 207* 184*   Lipid Profile: No results for input(s): CHOL, HDL, LDLCALC, TRIG, CHOLHDL, LDLDIRECT in the last 72 hours. Thyroid  Function Tests: No results for input(s): TSH, T4TOTAL, FREET4, T3FREE, THYROIDAB in the last 72 hours. Anemia Panel: No results for input(s): VITAMINB12, FOLATE, FERRITIN, TIBC, IRON, RETICCTPCT in the last 72 hours. Sepsis Labs: Recent Labs  Lab 08/10/24 2119 08/10/24 2303  LATICACIDVEN 1.4 1.1    Recent Results (from the past 240 hours)  Blood Culture (routine x 2)     Status: None   Collection Time: 08/10/24  9:17 PM   Specimen: BLOOD  Result Value Ref Range Status   Specimen Description BLOOD LEFT ANTECUBITAL  Final   Special Requests   Final    BOTTLES DRAWN AEROBIC AND ANAEROBIC Blood Culture adequate volume   Culture   Final    NO GROWTH 5 DAYS Performed at [facility], [address] [place], [place] [address]    Report Status 08/15/2024 FINAL  Final  Blood Culture (routine x 2)     Status: None   Collection Time: 08/10/24  9:17 PM   Specimen: BLOOD  Result Value Ref Range Status   Specimen Description BLOOD BLOOD RIGHT ARM  Final   Special Requests   Final  BOTTLES DRAWN AEROBIC AND ANAEROBIC Blood  Culture results may not be optimal due to an inadequate volume of blood received in culture bottles   Culture   Final    NO GROWTH 5 DAYS Performed at [facility], [address] [place], [place] [address]    Report Status 08/15/2024 FINAL  Final  Aerobic Culture w Gram Stain (superficial specimen)     Status: None   Collection Time: 08/11/24  7:59 AM   Specimen: Wound  Result Value Ref Range Status   Specimen Description   Final    WOUND Performed at [facility], [address] [place], [place] [address]    Special Requests   Final    WD LEFT FOOT Performed at [facility], [address] [place], [place] [address]    Gram Stain   Final    NO WBC SEEN RARE GRAM POSITIVE RODS FEW GRAM POSITIVE COCCI FEW GRAM NEGATIVE RODS Performed at [facility], [address], [place], [place] [address]    Culture   Final    MODERATE STAPHYLOCOCCUS AUREUS MODERATE MORGANELLA MORGANII MODERATE ENTEROCOCCUS FAECALIS    Report Status 08/14/2024 FINAL  Final   Organism ID, Bacteria MORGANELLA MORGANII  Final   Organism ID, Bacteria STAPHYLOCOCCUS AUREUS  Final   Organism ID, Bacteria ENTEROCOCCUS FAECALIS  Final      Susceptibility   Enterococcus faecalis - MIC*    AMPICILLIN  <=2 SENSITIVE Sensitive     VANCOMYCIN  1 SENSITIVE Sensitive     GENTAMICIN  SYNERGY SENSITIVE Sensitive     * MODERATE ENTEROCOCCUS FAECALIS   Morganella morganii - MIC*    AMPICILLIN  >=32 RESISTANT Resistant     ERTAPENEM <=0.12 SENSITIVE Sensitive     CIPROFLOXACIN  >=4 RESISTANT Resistant     GENTAMICIN  >=16 RESISTANT Resistant     MEROPENEM  <=0.25 SENSITIVE Sensitive     TRIMETH /SULFA  >=320 RESISTANT Resistant     AMPICILLIN /SULBACTAM >=32 RESISTANT Resistant     PIP/TAZO Value in next row Sensitive      <=4 SENSITIVEThis is a modified FDA-approved test that has been validated and its performance characteristics determined by the reporting laboratory.  This laboratory  is certified under the Clinical Laboratory Improvement Amendments CLIA as qualified to perform high complexity clinical laboratory testing.    * MODERATE MORGANELLA MORGANII   Staphylococcus aureus - MIC*    CIPROFLOXACIN  Value in next row Resistant      <=4 SENSITIVEThis is a modified FDA-approved test that has been validated and its performance characteristics determined by the reporting laboratory.  This laboratory is certified under the Clinical Laboratory Improvement Amendments CLIA as qualified to perform high complexity clinical laboratory testing.    ERYTHROMYCIN Value in next row Resistant      <=4 SENSITIVEThis is a modified FDA-approved test that has been validated and its performance characteristics determined by the reporting laboratory.  This laboratory is certified under the Clinical Laboratory Improvement Amendments CLIA as qualified to perform high complexity clinical laboratory testing.    GENTAMICIN  Value in next row Sensitive      <=4 SENSITIVEThis is a modified FDA-approved test that has been validated and its performance characteristics determined by the reporting laboratory.  This laboratory is certified under the Clinical Laboratory Improvement Amendments CLIA as qualified to perform high complexity clinical laboratory testing.    OXACILLIN Value in next row Sensitive      <=4 SENSITIVEThis is a modified FDA-approved test that has been validated and its performance  characteristics determined by the reporting laboratory.  This laboratory is certified under the Clinical Laboratory Improvement Amendments CLIA as qualified to perform high complexity clinical laboratory testing.    TETRACYCLINE Value in next row Resistant      <=4 SENSITIVEThis is a modified FDA-approved test that has been validated and its performance characteristics determined by the reporting laboratory.  This laboratory is certified under the Clinical Laboratory Improvement Amendments CLIA as qualified to perform  high complexity clinical laboratory testing.    VANCOMYCIN  Value in next row Sensitive      <=4 SENSITIVEThis is a modified FDA-approved test that has been validated and its performance characteristics determined by the reporting laboratory.  This laboratory is certified under the Clinical Laboratory Improvement Amendments CLIA as qualified to perform high complexity clinical laboratory testing.    TRIMETH /SULFA  Value in next row Resistant      <=4 SENSITIVEThis is a modified FDA-approved test that has been validated and its performance characteristics determined by the reporting laboratory.  This laboratory is certified under the Clinical Laboratory Improvement Amendments CLIA as qualified to perform high complexity clinical laboratory testing.    CLINDAMYCIN  Value in next row Resistant      <=4 SENSITIVEThis is a modified FDA-approved test that has been validated and its performance characteristics determined by the reporting laboratory.  This laboratory is certified under the Clinical Laboratory Improvement Amendments CLIA as qualified to perform high complexity clinical laboratory testing.    RIFAMPIN Value in next row Sensitive      <=4 SENSITIVEThis is a modified FDA-approved test that has been validated and its performance characteristics determined by the reporting laboratory.  This laboratory is certified under the Clinical Laboratory Improvement Amendments CLIA as qualified to perform high complexity clinical laboratory testing.    Inducible Clindamycin  Value in next row Sensitive      <=4 SENSITIVEThis is a modified FDA-approved test that has been validated and its performance characteristics determined by the reporting laboratory.  This laboratory is certified under the Clinical Laboratory Improvement Amendments CLIA as qualified to perform high complexity clinical laboratory testing.    LINEZOLID Value in next row Sensitive      <=4 SENSITIVEThis is a modified FDA-approved test that has been  validated and its performance characteristics determined by the reporting laboratory.  This laboratory is certified under the Clinical Laboratory Improvement Amendments CLIA as qualified to perform high complexity clinical laboratory testing.    * MODERATE STAPHYLOCOCCUS AUREUS  MRSA Next Gen by PCR, Nasal     Status: None   Collection Time: 08/17/24  1:40 PM   Specimen: Nasal Mucosa; Nasal Swab  Result Value Ref Range Status   MRSA by PCR Next Gen NOT DETECTED NOT DETECTED Final    Comment: (NOTE) The GeneXpert MRSA Assay (FDA approved for NASAL specimens only), is one component of a comprehensive MRSA colonization surveillance program. It is not intended to diagnose MRSA infection nor to guide or monitor treatment for MRSA infections. Test performance is not FDA approved in patients less than 79 years old. Performed at [facility], [address], [place], [place] [address]      Radiology Studies: DG C-Arm 1-60 Min-No Report Result Date: 08/17/2024 Fluoroscopy was utilized by the requesting physician.  No radiographic interpretation.   CARDIAC CATHETERIZATION Result Date: 08/16/2024   Mid LAD lesion is 50% stenosed.   Ost LM lesion is 30% stenosed.   There is moderate to severe left ventricular systolic dysfunction.   LV end diastolic  pressure is mildly elevated.   There is no aortic valve stenosis.   Anticipated discharge date to be determined.   Continue heparin .  Defer continuation of aspirin  as well as transition back to apixaban  to vascular surgery/internal medicine. Conclusions: Mild-moderate, nonobstructive coronary artery disease with 30% ostial LMCA and 50% mid LAD stenoses that are not hemodynamically significant (RFR 0.92). Mildly elevated left heart filling pressures (PCWP 20, LVEDP 18 mmHg). Upper normal right heart filling pressure (mean RA 5, RV EDP 6 mmHg). Moderate pulmonary hypertension (PA 60/22, mean 35 mmHg; PVR 3 WU). Normal Fick cardiac output/index (CO 5.0  L/min, CI 2.7 L/min/m). Recommendations: Continue medical therapy to prevent progression of moderate, nonobstructive CAD. Continue gentle diuresis and escalation of goal-directed medical therapy for nonischemic cardiomyopathy. Lonni Hanson, MD [facility]   Scheduled Meds:  sodium chloride    Intravenous Once   amiodarone   200 mg Oral Daily   apixaban   5 mg Oral BID   aspirin  EC  81 mg Oral Daily   [START ON 08/18/2024] atorvastatin   10 mg Oral Daily   Chlorhexidine  Gluconate Cloth  6 each Topical Daily   citalopram   20 mg Oral Daily   clopidogrel   75 mg Oral Daily   furosemide   40 mg Oral Daily   guaiFENesin   600 mg Oral BID   insulin  aspart  0-5 Units Subcutaneous QHS   insulin  aspart  0-6 Units Subcutaneous TID WC   insulin  glargine  8 Units Subcutaneous Daily   irbesartan   300 mg Oral Daily   pantoprazole   40 mg Oral Daily   spironolactone   25 mg Oral Daily   Continuous Infusions:  piperacillin -tazobactam (ZOSYN )  IV 3.375 g (08/17/24 0504)     LOS: 7 days   Norval Bar, MD  [facility]  08/17/2024, 6:29 PM   "

## 2024-08-17 NOTE — Anesthesia Procedure Notes (Signed)
 Arterial Line Insertion Start/End1/02/2025 8:43 AM, 08/17/2024 9:53 AM Performed by: Vicci Camellia Glatter, MD, anesthesiologist  Patient location: Pre-op. Preanesthetic checklist: patient identified, IV checked, site marked, risks and benefits discussed, surgical consent, monitors and equipment checked, pre-op evaluation, timeout performed and anesthesia consent Patient sedated Left, radial was placed Catheter size: 20 G Hand hygiene performed  and maximum sterile barriers used   Attempts: 3 Procedure performed using ultrasound to evaluate access site. Ultrasound Notes:relevant anatomy identified and vessel patent under ultrasound. Following insertion, dressing applied. Post procedure assessment: normal and unchanged  Additional procedure comments: First attempt by crna student under supervision with failed attempt. Second and third attempt by MD Vicci. Slight hematoma noted around distal artery via ultrasound. SABRA

## 2024-08-17 NOTE — Progress Notes (Signed)
 PHARMACY - ANTICOAGULATION CONSULT NOTE  Pharmacy Consult for UFH Indication: atrial fibrillation  Allergies[1]  Patient Measurements: Height: 5' 11 (180.3 cm) Weight: 68 kg (149 lb 14.6 oz) IBW/kg (Calculated) : 75.3 HEPARIN  DW (KG): 68  Labs: Recent Labs    08/14/24 0538 08/14/24 1307 08/15/24 0534 08/15/24 1311 08/16/24 0436 08/16/24 0819 08/16/24 1455 08/16/24 1457 08/16/24 1843 08/17/24 0221  HGB 12.2*  --  12.4*  --  12.6*  --  12.2* 11.9*  --  13.0  HCT 35.2*  --  36.2*  --  36.2*  --  36.0* 35.0*  --  38.0*  PLT 314  --  330  --  386  --   --   --   --  401*  HEPARINUNFRC 0.28*   < > 0.17* 0.37  --  0.20*  --   --   --  0.30  CREATININE 0.91  --  0.96  --   --   --   --   --  1.08  --    < > = values in this interval not displayed.    Estimated Creatinine Clearance: 63.8 mL/min (by C-G formula based on SCr of 1.08 mg/dL).   Medical History: Past Medical History:  Diagnosis Date   Adenomatous colon polyp    Allergic rhinitis    At risk for sleep apnea    STOP-BANG= 5   SENT TO PCP 03-14-2014   Bladder cancer (HCC)    CAD (coronary artery disease)    a. 07/2014 low risk MV; b. 07/2019 Cor CTA (FFR): LM 25-49 (nl), LAD mild prox/mid plaque (nl), D1 25-49p(nl w/ abnl FFR of 0.73 in inf branch), LCX/OM1 mild prox/mid plaque (nl), RCA nondominant, minimal Ca2+ plaque (nl), RPDA (mildly abnl @ 0.79)-->Med Rx..   Cataract    surgically removed bilateral   Condyloma acuminatum of penis    Diabetic neuropathy (HCC)    Diastolic dysfunction    a. 05/2019 Echo: EF 60-65%, no rwam, mod LVH, impaired relaxation, nl RV size/fxn, trace MR, triv TR.   Diverticulosis    GERD (gastroesophageal reflux disease)    History of bladder cancer    s/p  turbt  2013/   transitional cell carcinoma--    History of condyloma acuminatum    PERINEAL AREA  W/ RECURRENCY   History of gout    Hyperlipidemia    Hypertension    Hypoxemia 03/09/2024   Lower urinary tract symptoms  (LUTS)    PAF (paroxysmal atrial fibrillation) (HCC)    a. 06/2019 Event monitor: PAF <1% burden. Longest 3 mins 36 secs.   Productive cough    PSVT (paroxysmal supraventricular tachycardia)    a. 06/2019 Event monitor: 112 episodes of SVT, longest 21 secs.   PVD (peripheral vascular disease) with claudication    a. 03/2014 LE art duplex: long segment occlusion of mid to distal R SFA; b. 03/2020 ABI: nl left and mildly improved R ABI->med rx.   Renal artery stenosis    Renal artery stenosis    a. 12/2020 Renal art duplex: RRA 1-59%, LRA >60%.   Smokers' cough (HCC)    Type 2 diabetes mellitus with insulin  therapy (HCC) 1992   monitor by  dr ellsion   Wears dentures    upper   Assessment: 69 y/o M admitted with LE cellulitis and possible osteomyelitis on apixaban  PTA for AF with last dose yesterday. Pharmacy consulted to initiate heparin  bridge therapy.  Update 1/1 after baseline labs -- baseline aPTT  and HL both correlating. Will titrate by heparin  level.  1/6: S/p 1/5 angiogram. Heparin  held at 1359 due to cardiac catheterization today with plan to resume heparin  2 hours after TR band removal. Confirmed with RN on TR band removal at 1645.   Goal of Therapy:  Heparin  level 0.3-0.7 units/ml aPTT 66-102 seconds Monitor platelets by anticoagulation protocol: Yes  1/1 @ 1518: aPTT 43, HL 0.12 = SUBtherapeutic at 1050 un/hr 1/2 @ 0022: HL = < 0.1, SUBtherapeutic 1/2 @ 0843: HL = < 0.1, SUBtherapeutic 1/2 @ 1959: HL = < 0.1, SUBtherapeutic 1/3 @ 0706: HL = 0.26, SUBtherapeutic 1/3 @ 1628: HL = 0.16, SUBtherapeutic 1/3 @ 0015: HL = 0.37, therapeutic X 1  1/4 @ 1307: HL = 0.36, therapeutic x 2 1/5 @ 0534: HL = 0.17, SUBtherapeutic  1/5 @ 1311: HL = 0.37, therapeutic x 1 1/6 @ 0819: HL = 0.20, subtherapeutic 1/7 0221 HL 0.30, therapeutic x 1  Plan:  Continue heparin  infusion at 2500 units/hr Recheck HL in 6 hours to confirm  Daily CBC while on heparin  Per cards, resume DOAC before  discharge  Rankin CANDIE Dills, PharmD, [facility] 08/17/2024 3:15 AM               [1]  Allergies Allergen Reactions   Cefepime  Hives    Had allergic reaction to one of these 3 agents, unclear which one   Metoprolol  Hives    Had allergic reaction to one of these 3 agents, unclear which one  *Per RN, highly likely this is the cause of allergic rxn   Myrbetriq  [Mirabegron ] Hives    Had allergic reaction to one of these 3 agents, unclear which one

## 2024-08-17 NOTE — Anesthesia Procedure Notes (Signed)
 Procedure Name: Intubation Date/Time: 08/17/2024 8:53 AM  Performed by: Jestine Palma, CRNAPre-anesthesia Checklist: Patient identified, Emergency Drugs available, Suction available and Patient being monitored Patient Re-evaluated:Patient Re-evaluated prior to induction Oxygen Delivery Method: Circle system utilized Preoxygenation: Pre-oxygenation with 100% oxygen Induction Type: IV induction Ventilation: Mask ventilation without difficulty Laryngoscope Size: McGrath and 3 Grade View: Grade I Tube type: Oral Tube size: 7.5 mm Number of attempts: 1 Airway Equipment and Method: Stylet and Oral airway Placement Confirmation: ETT inserted through vocal cords under direct vision, positive ETCO2 and breath sounds checked- equal and bilateral Secured at: 21 cm Tube secured with: Tape Dental Injury: Teeth and Oropharynx as per pre-operative assessment

## 2024-08-17 NOTE — TOC Progression Note (Signed)
 Transition of Care [facility]) - Progression Note    Patient Details  Name: [name] MRN: [id number] Date of Birth: June 18, 1956  Transition of Care [facility]) CM/SW Contact  Daved JONETTA Hamilton, RN Phone Number: 08/17/2024, 9:25 AM  Clinical Narrative:     Spoke with Warren Eagles with [facility] DSS [phone]. Warren advised she has contacted [employer] to investigate the reason the patient does not have power at his home. Warren advised [employer] will not speak with her because she is not on the account, Jamie requested to have patient call [employer] and add her to the account. Warren advised that the Home Foreclosure is on hold per the [place] in December. The [employer] is working with the [employer] for a payment plan. This CM did request Warren confirm with the [employer] today that that information is still correct, Warren verbalized she would do so.                      Expected Discharge Plan and Services                                               Social Drivers of Health (SDOH) Interventions SDOH Screenings   Food Insecurity: Food Insecurity Present (08/11/2024)  Housing: High Risk (08/11/2024)  Transportation Needs: Unmet Transportation Needs (08/11/2024)  Utilities: At Risk (08/15/2024)  Alcohol Screen: Low Risk (06/10/2023)  Depression (PHQ2-9): Low Risk (07/05/2024)  Financial Resource Strain: High Risk (03/11/2024)  Physical Activity: Inactive (06/10/2023)  Social Connections: Socially Isolated (08/11/2024)  Stress: Stress Concern Present (03/11/2024)  Tobacco Use: High Risk (08/10/2024)  Health Literacy: Inadequate Health Literacy (02/17/2024)    Readmission Risk Interventions    03/30/2024    2:55 PM 03/09/2024    3:21 PM 12/30/2023    1:17 PM  Readmission Risk Prevention Plan  Post Dischage Appt   Complete  Medication Screening   Complete  Transportation Screening Complete Complete Complete  PCP or Specialist Appt within 5-7 Days  Complete   PCP or Specialist  Appt within 3-5 Days Complete    Home Care Screening  Complete   Medication Review (RN CM)  Complete   HRI or Home Care Consult Complete    Social Work Consult for Recovery Care Planning/Counseling Complete    Palliative Care Screening Complete    Medication Review Oceanographer) Referral to Pharmacy

## 2024-08-17 NOTE — Inpatient Diabetes Management (Signed)
 Inpatient Diabetes Program Recommendations  AACE/ADA: New Consensus Statement on Inpatient Glycemic Control (2015)  Target Ranges:  Prepandial:   less than 140 mg/dL      Peak postprandial:   less than 180 mg/dL (1-2 hours)      Critically ill patients:  140 - 180 mg/dL   Lab Results  Component Value Date   GLUCAP 275 (H) 08/17/2024   HGBA1C 11.2 (H) 02/22/2024    Review of Glycemic Control  Latest Reference Range & Units 08/16/24 14:06 08/16/24 17:38 08/16/24 21:37 08/17/24 07:54 08/17/24 08:07  Glucose-Capillary 70 - 99 mg/dL 795 (H) 816 (H) 708 (H) 282 (H) 275 (H)   Diabetes history: DM 2 Outpatient Diabetes medications:  Dexcom G7 Toujeo  40 units q AM Current orders for Inpatient glycemic control:  Novolog  0-6 units tid with meals and HS Lantus  8 units daily  Inpatient Diabetes Program Recommendations:    Note patient NPO for procedure today.  CBG's>goal.  Recommend increasing Lantus  to 20 units daily (needs dose today).  Also consider increasing Novolog  correction to moderate 0-15 units tid with meals and HS.    Thanks,  Randall Bullocks, RN, BC-ADM Inpatient Diabetes Coordinator Pager [phone]  (8a-5p)

## 2024-08-17 NOTE — Progress Notes (Signed)
 Pt transported to pre-op with transport. Pt A&O x 4, Heparin , fluids, and antibiotics running. Pt denies any pain. Vitals and CBG checked prior to departure, insulin  not given, pre-op nurse made aware that insulin  was not given.

## 2024-08-17 NOTE — Anesthesia Postprocedure Evaluation (Signed)
"   Anesthesia Post Note  Patient: [name]  Procedure(s) Performed: ENDARTERECTOMY, FEMORAL (Left: Groin) APPLICATION OF CELL SAVER (Left) INSERTION, STENT, ARTERY, ILIAC (Left: Groin) THROMBECTOMY, ARTERY, FEMORAL (Left: Groin)  Patient location during evaluation: PACU Anesthesia Type: General Level of consciousness: awake and alert Pain management: pain level controlled Vital Signs Assessment: post-procedure vital signs reviewed and stable Respiratory status: spontaneous breathing, nonlabored ventilation and respiratory function stable Cardiovascular status: blood pressure returned to baseline and stable Postop Assessment: no apparent nausea or vomiting Anesthetic complications: no   No notable events documented.   Last Vitals:  Vitals:   08/17/24 1340 08/17/24 1400  BP: 139/60 (!) 137/59  Pulse: 64 62  Resp: 14   Temp: 36.8 C   SpO2: 94% 92%    Last Pain:  Vitals:   08/17/24 1340  TempSrc: Oral  PainSc:                  [name]      "

## 2024-08-17 NOTE — Transfer of Care (Signed)
 Immediate Anesthesia Transfer of Care Note  Patient: [name]  Procedure(s) Performed: ENDARTERECTOMY, FEMORAL (Left: Groin) APPLICATION OF CELL SAVER (Left) INSERTION, STENT, ARTERY, ILIAC (Left: Groin) THROMBECTOMY, ARTERY, FEMORAL (Left: Groin)  Patient Location: PACU  Anesthesia Type:General  Level of Consciousness: awake, alert , and drowsy  Airway & Oxygen Therapy: Patient Spontanous Breathing and Patient connected to nasal cannula oxygen  Post-op Assessment: Report given to RN and Post -op Vital signs reviewed and stable  Post vital signs: stable  Last Vitals:  Vitals Value Taken Time  BP 169/81 08/17/24 12:02  Temp 36.7 C 08/17/24 12:02  Pulse 68 08/17/24 12:10  Resp 18 08/17/24 12:10  SpO2 100 % 08/17/24 12:10  Vitals shown include unfiled device data.  Last Pain:  Vitals:   08/17/24 0820  TempSrc: Temporal  PainSc: 6       Patients Stated Pain Goal: 0 (08/12/24 0533)  Complications: No notable events documented.

## 2024-08-18 ENCOUNTER — Inpatient Hospital Stay: Admitting: Anesthesiology

## 2024-08-18 ENCOUNTER — Inpatient Hospital Stay

## 2024-08-18 ENCOUNTER — Telehealth: Payer: Self-pay | Admitting: *Deleted

## 2024-08-18 ENCOUNTER — Encounter: Admission: EM | Payer: Self-pay | Source: Home / Self Care | Attending: Internal Medicine

## 2024-08-18 ENCOUNTER — Encounter: Payer: Self-pay | Admitting: Vascular Surgery

## 2024-08-18 DIAGNOSIS — I96 Gangrene, not elsewhere classified: Secondary | ICD-10-CM | POA: Diagnosis not present

## 2024-08-18 HISTORY — PX: TRANSMETATARSAL AMPUTATION: SHX6197

## 2024-08-18 LAB — CBC
HCT: 34.6 % — ABNORMAL LOW (ref 39.0–52.0)
Hemoglobin: 11.9 g/dL — ABNORMAL LOW (ref 13.0–17.0)
MCH: 32.2 pg (ref 26.0–34.0)
MCHC: 34.4 g/dL (ref 30.0–36.0)
MCV: 93.8 fL (ref 80.0–100.0)
Platelets: 382 K/uL (ref 150–400)
RBC: 3.69 MIL/uL — ABNORMAL LOW (ref 4.22–5.81)
RDW: 13.7 % (ref 11.5–15.5)
WBC: 11.6 K/uL — ABNORMAL HIGH (ref 4.0–10.5)
nRBC: 0 % (ref 0.0–0.2)

## 2024-08-18 LAB — GLUCOSE, CAPILLARY
Glucose-Capillary: 242 mg/dL — ABNORMAL HIGH (ref 70–99)
Glucose-Capillary: 188 mg/dL — ABNORMAL HIGH (ref 70–99)
Glucose-Capillary: 194 mg/dL — ABNORMAL HIGH (ref 70–99)
Glucose-Capillary: 191 mg/dL — ABNORMAL HIGH (ref 70–99)
Glucose-Capillary: 310 mg/dL — ABNORMAL HIGH (ref 70–99)
Glucose-Capillary: 468 mg/dL — ABNORMAL HIGH (ref 70–99)

## 2024-08-18 LAB — LIPOPROTEIN A (LPA): Lipoprotein (a): 83.8 nmol/L — ABNORMAL HIGH

## 2024-08-18 LAB — SURGICAL PATHOLOGY

## 2024-08-18 LAB — POTASSIUM: Potassium: 4 mmol/L (ref 3.5–5.1)

## 2024-08-18 MED ORDER — BUPIVACAINE HCL 0.5 % IJ SOLN
INTRAMUSCULAR | Status: DC | PRN
  Administered 2024-08-18: 5 mL

## 2024-08-18 MED ORDER — FENTANYL CITRATE (PF) 100 MCG/2ML IJ SOLN
INTRAMUSCULAR | Status: DC | PRN
  Administered 2024-08-18: 50 ug via INTRAVENOUS

## 2024-08-18 MED ORDER — PROPOFOL 10 MG/ML IV BOLUS
INTRAVENOUS | Status: AC
  Filled 2024-08-18: qty 20

## 2024-08-18 MED ORDER — OXYCODONE HCL 5 MG/5ML PO SOLN: 5.0000 mg | Freq: Once | ORAL | Status: DC | PRN

## 2024-08-18 MED ORDER — BUPIVACAINE HCL (PF) 0.5 % IJ SOLN
INTRAMUSCULAR | Status: AC
  Filled 2024-08-18: qty 30

## 2024-08-18 MED ORDER — LIDOCAINE HCL (CARDIAC) PF 100 MG/5ML IV SOSY
PREFILLED_SYRINGE | INTRAVENOUS | Status: DC | PRN
  Administered 2024-08-18: 80 mg via INTRAVENOUS

## 2024-08-18 MED ORDER — LIDOCAINE HCL (PF) 1 % IJ SOLN
INTRAMUSCULAR | Status: AC
  Filled 2024-08-18: qty 30

## 2024-08-18 MED ORDER — NEPRO/CARBSTEADY PO LIQD
237.0000 mL | Freq: Three times a day (TID) | ORAL | Status: DC
  Administered 2024-08-18 – 2024-08-22 (×11): 237 mL via ORAL

## 2024-08-18 MED ORDER — SODIUM CHLORIDE 0.9 % IV SOLN: INTRAVENOUS | Status: DC | PRN

## 2024-08-18 MED ORDER — FENTANYL CITRATE (PF) 100 MCG/2ML IJ SOLN: 25.0000 ug | INTRAMUSCULAR | Status: DC | PRN

## 2024-08-18 MED ORDER — ADULT MULTIVITAMIN W/MINERALS CH
1.0000 | ORAL_TABLET | Freq: Every day | ORAL | Status: DC
  Administered 2024-08-19 – 2024-08-22 (×4): 1 via ORAL
  Filled 2024-08-18 (×4): qty 1

## 2024-08-18 MED ORDER — LIDOCAINE HCL 1 % IJ SOLN
INTRAMUSCULAR | Status: DC | PRN
  Administered 2024-08-18: 5 mL

## 2024-08-18 MED ORDER — EPHEDRINE SULFATE-NACL 50-0.9 MG/10ML-% IV SOSY
PREFILLED_SYRINGE | INTRAVENOUS | Status: DC | PRN
  Administered 2024-08-18: 10 mg via INTRAVENOUS

## 2024-08-18 MED ORDER — ONDANSETRON HCL 4 MG/2ML IJ SOLN
INTRAMUSCULAR | Status: DC | PRN
  Administered 2024-08-18: 4 mg via INTRAVENOUS

## 2024-08-18 MED ORDER — PROPOFOL 1000 MG/100ML IV EMUL
INTRAVENOUS | Status: AC
  Filled 2024-08-18: qty 100

## 2024-08-18 MED ORDER — PROPOFOL 10 MG/ML IV BOLUS
INTRAVENOUS | Status: DC | PRN
  Administered 2024-08-18: 150 mg via INTRAVENOUS

## 2024-08-18 MED ORDER — OXYCODONE HCL 5 MG PO TABS: 5.0000 mg | ORAL_TABLET | Freq: Once | ORAL | Status: DC | PRN

## 2024-08-18 MED ORDER — FENTANYL CITRATE (PF) 100 MCG/2ML IJ SOLN
INTRAMUSCULAR | Status: AC
  Filled 2024-08-18: qty 2

## 2024-08-18 MED ORDER — ONDANSETRON HCL 4 MG/2ML IJ SOLN
INTRAMUSCULAR | Status: AC
  Filled 2024-08-18: qty 2

## 2024-08-18 MED ORDER — 0.9 % SODIUM CHLORIDE (POUR BTL) OPTIME: TOPICAL | Status: DC | PRN

## 2024-08-18 MED ORDER — MIDAZOLAM HCL 2 MG/2ML IJ SOLN
INTRAMUSCULAR | Status: AC
  Filled 2024-08-18: qty 2

## 2024-08-18 MED ORDER — SODIUM CHLORIDE 0.9 % IR SOLN
Status: DC | PRN
  Administered 2024-08-18: 1000 mL

## 2024-08-18 MED ORDER — VITAMIN C 500 MG PO TABS
500.0000 mg | ORAL_TABLET | Freq: Two times a day (BID) | ORAL | Status: DC
  Administered 2024-08-18 – 2024-08-22 (×8): 500 mg via ORAL
  Filled 2024-08-18 (×8): qty 1

## 2024-08-18 MED ORDER — PROPOFOL 500 MG/50ML IV EMUL
INTRAVENOUS | Status: DC | PRN
  Administered 2024-08-18: 100 ug/kg/min via INTRAVENOUS

## 2024-08-18 MED ORDER — GLUCERNA SHAKE PO LIQD: 237.0000 mL | Freq: Three times a day (TID) | ORAL | Status: DC

## 2024-08-18 MED ORDER — PHENYLEPHRINE 80 MCG/ML (10ML) SYRINGE FOR IV PUSH (FOR BLOOD PRESSURE SUPPORT)
PREFILLED_SYRINGE | INTRAVENOUS | Status: DC | PRN
  Administered 2024-08-18 (×4): 80 ug via INTRAVENOUS

## 2024-08-18 MED ORDER — DEXAMETHASONE SOD PHOSPHATE PF 10 MG/ML IJ SOLN
INTRAMUSCULAR | Status: DC | PRN
  Administered 2024-08-18: 2 mg via INTRAVENOUS

## 2024-08-18 NOTE — Progress Notes (Signed)
 " PROGRESS NOTE    [name]  FMW:[id number] DOB: 08-14-1955 DOA: 08/10/2024 PCP: Norleen Lynwood LELON, MD  Subjective: No acute events overnight. Seen and examined at bedside. Reports pain controlled. Remains NPO in anticipation for amputation procedure today. Denies nausea, vomiting, constipation    Hospital Course: 69 y.o. male with medical history significant for PAD, osteomyelitis s/p toe amputation, DM, HTN, bladder cancer, paroxysmal atrial fibrillation on Eliquis , , depression being admitted for cellulitis left foot with possible osteomyelitis.  He was admitted for the same in August 2025, undergoing amputation of the left great toe at that time, and he was treated with meropenem -> Cipro  to complete 4 weeks after OR cultures grew Pseudomonas, Enterobacter and Proteus.  Today he presented by EMS with pain and swelling of the left foot with redness as well as dizziness and weakness, which caused him to fall hitting his head on 1 occasion but without loss of consciousness.  He reports not having electricity in the house and cannot power his cell phone.  He decided to come into the ED to get checked out. In the ED, temp of 99.2 with otherwise normal vitals.  Labs notable for WBC 19,000 but otherwise unremarkable. EKG with no acute concerning findings Venous ultrasound negative for DVT Foot x-ray showed the following: IMPRESSION: 1. Soft tissue swelling along the plantar surface of the forefoot at the amputation site with subcutaneous gas. 2. Mild cortical irregularity along the medial margin of the remaining first metatarsal head, which could indicate osteomyelitis. 3. 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   The ED provider reached out to podiatrist, Dr.  Lamount who recommended Zosyn  and vancomycin  and get MRI.  No mention of OR  MRI of the foot did not show any convincing evidence of acute osteomyelitis at this time.  1/2.  Patient was  contemplating leaving the hospital.  I told him we need to get lab draws if were on heparin  drip. 1/3.  Sugars finally up we will restart Lantus  insulin .  Patient states that he does not have transportation at home or power.  He is interested in returning home.  Morganella resistant to all oral antibiotics. 1/4.  Staph aureus in the wound sensitive to oxacillin, Enterococcus in the wound sensitive to ampicillin .  Zosyn  would cover all of these organisms.  Discontinue vancomycin .  For angiogram tomorrow. 1/5.  Angiogram today.  Case discussed with podiatry and will need at least a transmet amputation but further discussion was may be needed depending on angiogram results. 1/6.  Cardiology planning on cardiac catheterization this afternoon for cardiac clearance for femoral endarterectomy tomorrow.   Assessment and Plan:  Gangrene (HCC) PAD (peripheral artery disease) Tissue on the bottom of the foot and second toe does not look good.  Angiogram done on 1/5 Status post Left common femoral, profunda femoris, and superficial femoral artery endarterectomies and patch angioplasty; Left lower extremity angiogram; Mechanical thrombectomy to the left SFA and was proximal popliteal artery with the 8 French Rota Rex device; Stent placement x 2 to the left SFA and proximal popliteal artery with 6 mm diameter by 15 cm length and 7 mm diameter by 15 cm length Viabahn stents   Plan for TMA by podiatry Plan to resume eliquis  after procedures when ok by surgical teams Vascular surgery following Podiatry following   Cellulitis of left foot Also with numerous foot wounds and starting of gangrene.   Appreciate podiatry and vascular  surgery consultations.  MRI does not show any signs of osteomyelitis but tissue on the bottom of the foot does not look good.  Wound culture growing moderate Staph aureus, Morganella and Enterococcus.   Morganella does show resistance and we do not have an oral choice.   - Patient started  on aggressive antibiotics initially with vancomycin  and Zosyn .  Vancomycin  discontinued on 1/4 since Zosyn  will cover all 3 organisms.    Uncontrolled type 2 diabetes mellitus with hypoglycemia, with long-term current use of insulin  [facility]) Patient had a sugar of 38 on 08/11/2024.  Restarted lower dose of Lantus  2 days ago tjat was lowered yesterday in anticipation for surgeries.   - Continue to monitor sugars closely.     PAF (paroxysmal atrial fibrillation) (HCC) Continue amiodarone  Resume eliquis  after procedures   CAD (coronary artery disease) Continue heparin , atorvastatin    Essential hypertension Continue irbesartan , spironolactone  and Lasix    Bladder cancer (HCC) Follow with oncology outpatient   Housing instability TOC following   Tobacco abuse Nicotine patch.  Patient must stop smoking.  DVT prophylaxis:   SCDs   Code Status: Full Code  Disposition Plan: TBD pending clinical course Reason for continuing need for hospitalization: severity of illness, needs amputation, IV antibiotics, PT eval after amputation, safe disposition plan   Objective: Vitals:   08/18/24 1100 08/18/24 1205 08/18/24 1214 08/18/24 1330  BP: (!) 118/50 (!) 124/59  (!) 114/51  Pulse: (!) 59 68  62  Resp: 13 16  14   Temp:  98.1 F (36.7 C)  (!) 96.9 F (36.1 C)  TempSrc:      SpO2: 95% 99%  100%  Weight:   69.6 kg   Height:   5' 11 (1.803 m)     Intake/Output Summary (Last 24 hours) at 08/18/2024 1347 Last data filed at 08/18/2024 1318 Gross per 24 hour  Intake 1728.45 ml  Output 830 ml  Net 898.45 ml   Filed Weights   08/15/24 1430 08/17/24 1340 08/18/24 1214  Weight: 68 kg 69.6 kg 69.6 kg    Examination:  Physical Exam Vitals and nursing note reviewed.  Constitutional:      General: He is not in acute distress.    Appearance: He is ill-appearing.     Comments: disheveled  HENT:     Head: Normocephalic and atraumatic.  Cardiovascular:     Rate and Rhythm: Normal rate and  regular rhythm.     Pulses: Normal pulses.     Heart sounds: Normal heart sounds.  Pulmonary:     Effort: Pulmonary effort is normal.     Breath sounds: Normal breath sounds.  Abdominal:     General: Bowel sounds are normal.     Palpations: Abdomen is soft.  Skin:    Comments: L groin honeycomb clean/dry/intact L foot with bandage clean/dry/intact  Neurological:     Mental Status: He is alert. Mental status is at baseline.     Data Reviewed: I have personally reviewed following labs and imaging studies  CBC: Recent Labs  Lab 08/14/24 0538 08/15/24 0534 08/16/24 0436 08/16/24 1455 08/16/24 1457 08/17/24 0221 08/18/24 0344  WBC 10.6* 10.7* 10.7*  --   --  11.4* 11.6*  HGB 12.2* 12.4* 12.6* 12.2* 11.9* 13.0 11.9*  HCT 35.2* 36.2* 36.2* 36.0* 35.0* 38.0* 34.6*  MCV 91.9 93.8 92.8  --   --  93.4 93.8  PLT 314 330 386  --   --  401* 382   Basic Metabolic Panel: Recent Labs  Lab 08/13/24 0706 08/14/24 0538 08/15/24 0534 08/16/24 1455 08/16/24 1457 08/16/24 1843 08/17/24 1612 08/18/24 0938  NA 135  --   --  130* 134* 137 138  --   K 3.7  --   --  3.4* 3.5 3.8 5.0 4.0  CL 98  --   --   --   --  95* 99  --   CO2 29  --   --   --   --  32 25  --   GLUCOSE 232*  --   --   --   --  190* 230*  --   BUN 15  --   --   --   --  17 19  --   CREATININE 0.83 0.91 0.96  --   --  1.08 1.02  --   CALCIUM  8.6*  --   --   --   --  9.1 8.9  --    GFR: Estimated Creatinine Clearance: 69.2 mL/min (by C-G formula based on SCr of 1.02 mg/dL). Liver Function Tests: No results for input(s): AST, ALT, ALKPHOS, BILITOT, PROT, ALBUMIN  in the last 168 hours. No results for input(s): LIPASE, AMYLASE in the last 168 hours. No results for input(s): AMMONIA in the last 168 hours. Coagulation Profile: No results for input(s): INR, PROTIME in the last 168 hours. Cardiac Enzymes: No results for input(s): CKTOTAL, CKMB, CKMBINDEX, TROPONINI in the last 168  hours. ProBNP, BNP (last 5 results) Recent Labs    03/08/24 2357 06/10/24 2158 07/13/24 0205  BNP 269.2* 600.6* 801.9*   HbA1C: No results for input(s): HGBA1C in the last 72 hours. CBG: Recent Labs  Lab 08/17/24 2119 08/18/24 0746 08/18/24 1114 08/18/24 1216 08/18/24 1335  GLUCAP 297* 242* 188* 194* 191*   Lipid Profile: No results for input(s): CHOL, HDL, LDLCALC, TRIG, CHOLHDL, LDLDIRECT in the last 72 hours. Thyroid  Function Tests: No results for input(s): TSH, T4TOTAL, FREET4, T3FREE, THYROIDAB in the last 72 hours. Anemia Panel: No results for input(s): VITAMINB12, FOLATE, FERRITIN, TIBC, IRON, RETICCTPCT in the last 72 hours. Sepsis Labs: No results for input(s): PROCALCITON, LATICACIDVEN in the last 168 hours.  Recent Results (from the past 240 hours)  Blood Culture (routine x 2)     Status: None   Collection Time: 08/10/24  9:17 PM   Specimen: BLOOD  Result Value Ref Range Status   Specimen Description BLOOD LEFT ANTECUBITAL  Final   Special Requests   Final    BOTTLES DRAWN AEROBIC AND ANAEROBIC Blood Culture adequate volume   Culture   Final    NO GROWTH 5 DAYS Performed at [facility], [address] [place], [place] [address]    Report Status 08/15/2024 FINAL  Final  Blood Culture (routine x 2)     Status: None   Collection Time: 08/10/24  9:17 PM   Specimen: BLOOD  Result Value Ref Range Status   Specimen Description BLOOD BLOOD RIGHT ARM  Final   Special Requests   Final    BOTTLES DRAWN AEROBIC AND ANAEROBIC Blood Culture results may not be optimal due to an inadequate volume of blood received in culture bottles   Culture   Final    NO GROWTH 5 DAYS Performed at [facility], [address] [place], [place] [address]    Report Status 08/15/2024 FINAL  Final  Aerobic Culture w Gram Stain (superficial specimen)     Status: None   Collection Time: 08/11/24  7:59 AM  Specimen: Wound   Result Value Ref Range Status   Specimen Description   Final    WOUND Performed at [facility], [address], [place], [place] [address]    Special Requests   Final    WD LEFT FOOT Performed at [facility], [address] [place], [place] [address]    Gram Stain   Final    NO WBC SEEN RARE GRAM POSITIVE RODS FEW GRAM POSITIVE COCCI FEW GRAM NEGATIVE RODS Performed at [facility], [address] [place], [place] [address]    Culture   Final    MODERATE STAPHYLOCOCCUS AUREUS MODERATE MORGANELLA MORGANII MODERATE ENTEROCOCCUS FAECALIS    Report Status 08/14/2024 FINAL  Final   Organism ID, Bacteria MORGANELLA MORGANII  Final   Organism ID, Bacteria STAPHYLOCOCCUS AUREUS  Final   Organism ID, Bacteria ENTEROCOCCUS FAECALIS  Final      Susceptibility   Enterococcus faecalis - MIC*    AMPICILLIN  <=2 SENSITIVE Sensitive     VANCOMYCIN  1 SENSITIVE Sensitive     GENTAMICIN  SYNERGY SENSITIVE Sensitive     * MODERATE ENTEROCOCCUS FAECALIS   Morganella morganii - MIC*    AMPICILLIN  >=32 RESISTANT Resistant     ERTAPENEM <=0.12 SENSITIVE Sensitive     CIPROFLOXACIN  >=4 RESISTANT Resistant     GENTAMICIN  >=16 RESISTANT Resistant     MEROPENEM  <=0.25 SENSITIVE Sensitive     TRIMETH /SULFA  >=320 RESISTANT Resistant     AMPICILLIN /SULBACTAM >=32 RESISTANT Resistant     PIP/TAZO Value in next row Sensitive      <=4 SENSITIVEThis is a modified FDA-approved test that has been validated and its performance characteristics determined by the reporting laboratory.  This laboratory is certified under the Clinical Laboratory Improvement Amendments CLIA as qualified to perform high complexity clinical laboratory testing.    * MODERATE MORGANELLA MORGANII   Staphylococcus aureus - MIC*    CIPROFLOXACIN  Value in next row Resistant      <=4 SENSITIVEThis is a modified FDA-approved test that has been validated and its performance characteristics determined by the  reporting laboratory.  This laboratory is certified under the Clinical Laboratory Improvement Amendments CLIA as qualified to perform high complexity clinical laboratory testing.    ERYTHROMYCIN Value in next row Resistant      <=4 SENSITIVEThis is a modified FDA-approved test that has been validated and its performance characteristics determined by the reporting laboratory.  This laboratory is certified under the Clinical Laboratory Improvement Amendments CLIA as qualified to perform high complexity clinical laboratory testing.    GENTAMICIN  Value in next row Sensitive      <=4 SENSITIVEThis is a modified FDA-approved test that has been validated and its performance characteristics determined by the reporting laboratory.  This laboratory is certified under the Clinical Laboratory Improvement Amendments CLIA as qualified to perform high complexity clinical laboratory testing.    OXACILLIN Value in next row Sensitive      <=4 SENSITIVEThis is a modified FDA-approved test that has been validated and its performance characteristics determined by the reporting laboratory.  This laboratory is certified under the Clinical Laboratory Improvement Amendments CLIA as qualified to perform high complexity clinical laboratory testing.    TETRACYCLINE Value in next row Resistant      <=4 SENSITIVEThis is a modified FDA-approved test that has been validated and its performance characteristics determined by the reporting laboratory.  This laboratory is certified under the Clinical Laboratory Improvement Amendments CLIA as qualified to perform high complexity clinical laboratory  testing.    VANCOMYCIN  Value in next row Sensitive      <=4 SENSITIVEThis is a modified FDA-approved test that has been validated and its performance characteristics determined by the reporting laboratory.  This laboratory is certified under the Clinical Laboratory Improvement Amendments CLIA as qualified to perform high complexity clinical  laboratory testing.    TRIMETH /SULFA  Value in next row Resistant      <=4 SENSITIVEThis is a modified FDA-approved test that has been validated and its performance characteristics determined by the reporting laboratory.  This laboratory is certified under the Clinical Laboratory Improvement Amendments CLIA as qualified to perform high complexity clinical laboratory testing.    CLINDAMYCIN  Value in next row Resistant      <=4 SENSITIVEThis is a modified FDA-approved test that has been validated and its performance characteristics determined by the reporting laboratory.  This laboratory is certified under the Clinical Laboratory Improvement Amendments CLIA as qualified to perform high complexity clinical laboratory testing.    RIFAMPIN Value in next row Sensitive      <=4 SENSITIVEThis is a modified FDA-approved test that has been validated and its performance characteristics determined by the reporting laboratory.  This laboratory is certified under the Clinical Laboratory Improvement Amendments CLIA as qualified to perform high complexity clinical laboratory testing.    Inducible Clindamycin  Value in next row Sensitive      <=4 SENSITIVEThis is a modified FDA-approved test that has been validated and its performance characteristics determined by the reporting laboratory.  This laboratory is certified under the Clinical Laboratory Improvement Amendments CLIA as qualified to perform high complexity clinical laboratory testing.    LINEZOLID  Value in next row Sensitive      <=4 SENSITIVEThis is a modified FDA-approved test that has been validated and its performance characteristics determined by the reporting laboratory.  This laboratory is certified under the Clinical Laboratory Improvement Amendments CLIA as qualified to perform high complexity clinical laboratory testing.    * MODERATE STAPHYLOCOCCUS AUREUS  MRSA Next Gen by PCR, Nasal     Status: None   Collection Time: 08/17/24  1:40 PM   Specimen:  Nasal Mucosa; Nasal Swab  Result Value Ref Range Status   MRSA by PCR Next Gen NOT DETECTED NOT DETECTED Final    Comment: (NOTE) The GeneXpert MRSA Assay (FDA approved for NASAL specimens only), is one component of a comprehensive MRSA colonization surveillance program. It is not intended to diagnose MRSA infection nor to guide or monitor treatment for MRSA infections. Test performance is not FDA approved in patients less than 44 years old. Performed at [facility], [address] [place], [place] [address]      Radiology Studies: DG C-Arm 1-60 Min-No Report Result Date: 08/17/2024 Fluoroscopy was utilized by the requesting physician.  No radiographic interpretation.   CARDIAC CATHETERIZATION Result Date: 08/16/2024   Mid LAD lesion is 50% stenosed.   Ost LM lesion is 30% stenosed.   There is moderate to severe left ventricular systolic dysfunction.   LV end diastolic pressure is mildly elevated.   There is no aortic valve stenosis.   Anticipated discharge date to be determined.   Continue heparin .  Defer continuation of aspirin  as well as transition back to apixaban  to vascular surgery/internal medicine. Conclusions: Mild-moderate, nonobstructive coronary artery disease with 30% ostial LMCA and 50% mid LAD stenoses that are not hemodynamically significant (RFR 0.92). Mildly elevated left heart filling pressures (PCWP 20, LVEDP 18 mmHg). Upper normal right heart filling pressure (mean RA  5, RV EDP 6 mmHg). Moderate pulmonary hypertension (PA 60/22, mean 35 mmHg; PVR 3 WU). Normal Fick cardiac output/index (CO 5.0 L/min, CI 2.7 L/min/m). Recommendations: Continue medical therapy to prevent progression of moderate, nonobstructive CAD. Continue gentle diuresis and escalation of goal-directed medical therapy for nonischemic cardiomyopathy. Lonni Hanson, MD [facility]   Scheduled Meds:  [facility] Hold] sodium chloride    Intravenous Once   [MAR Hold] amiodarone   200 mg Oral Daily    [MAR Hold] aspirin  EC  81 mg Oral Daily   [MAR Hold] atorvastatin   10 mg Oral Daily   [MAR Hold] Chlorhexidine  Gluconate Cloth  6 each Topical Daily   [MAR Hold] citalopram   20 mg Oral Daily   [MAR Hold] clopidogrel   75 mg Oral Daily   [MAR Hold] furosemide   40 mg Oral Daily   [MAR Hold] guaiFENesin   600 mg Oral BID   [MAR Hold] insulin  aspart  0-5 Units Subcutaneous QHS   [MAR Hold] insulin  aspart  0-6 Units Subcutaneous TID WC   [MAR Hold] insulin  glargine  8 Units Subcutaneous Daily   [MAR Hold] irbesartan   300 mg Oral Daily   [MAR Hold] pantoprazole   40 mg Oral Daily   [MAR Hold] spironolactone   25 mg Oral Daily   Continuous Infusions:  [MAR Hold] piperacillin -tazobactam (ZOSYN )  IV 3.375 g (08/18/24 1130)     LOS: 8 days   Norval Bar, MD  [facility]  08/18/2024, 1:47 PM   "

## 2024-08-18 NOTE — Anesthesia Postprocedure Evaluation (Signed)
"   Anesthesia Post Note  Patient: [name]  Procedure(s) Performed: AMPUTATION, FOOT, TRANSMETATARSAL (Left: Foot)  Patient location during evaluation: PACU Anesthesia Type: General Level of consciousness: awake and alert Pain management: pain level controlled Vital Signs Assessment: post-procedure vital signs reviewed and stable Respiratory status: spontaneous breathing, nonlabored ventilation, respiratory function stable and patient connected to nasal cannula oxygen Cardiovascular status: blood pressure returned to baseline and stable Postop Assessment: no apparent nausea or vomiting Anesthetic complications: no   No notable events documented.   Last Vitals:  Vitals:   08/18/24 1430 08/18/24 1445  BP: 117/61 (!) 124/55  Pulse: 66 69  Resp: 16 16  Temp: (!) 36.2 C   SpO2: 96% 96%    Last Pain:  Vitals:   08/18/24 1515  TempSrc:   PainSc: 0-No pain                 Lendia LITTIE Mae      "

## 2024-08-18 NOTE — Progress Notes (Signed)
 History and Physical Interval Note:  08/18/2024 12:03 PM  [name]  has presented today for surgery, with the diagnosis of gangrene and infection left forefoot.  The various methods of treatment have been discussed with the patient and family. After consideration of risks, benefits and other options for treatment, the patient has consented to   Procedures with comments: AMPUTATION, FOOT, TRANSMETATARSAL (Left) - Left foot TMA as a surgical intervention.  The patient's history has been reviewed, patient examined, no change in status, stable for surgery.  I have reviewed the patient's chart and labs.  Questions were answered to the patient's satisfaction.     Marsa FALCON Akim Watkinson

## 2024-08-18 NOTE — Anesthesia Preprocedure Evaluation (Addendum)
 "                                  Anesthesia Evaluation  Patient identified by MRN, date of birth, ID band Patient awake    Reviewed: Allergy & Precautions, NPO status , Patient's Chart, lab work & pertinent test results  History of Anesthesia Complications Negative for: history of anesthetic complications  Airway Mallampati: III  TM Distance: >3 FB Neck ROM: full    Dental  (+) Edentulous Lower, Edentulous Upper   Pulmonary asthma , Current Smoker and Patient abstained from smoking.   Pulmonary exam normal        Cardiovascular hypertension, + CAD and + Peripheral Vascular Disease  + dysrhythmias Atrial Fibrillation and Supra Ventricular Tachycardia      Neuro/Psych  PSYCHIATRIC DISORDERS Anxiety      Neuromuscular disease    GI/Hepatic Neg liver ROS,GERD  ,,  Endo/Other  diabetes, Type 2    Renal/GU negative Renal ROS  negative genitourinary   Musculoskeletal   Abdominal   Peds  Hematology negative hematology ROS (+)   Anesthesia Other Findings Past Medical History: No date: Adenomatous colon polyp No date: Allergic rhinitis No date: At risk for sleep apnea     Comment:  STOP-BANG= 5   SENT TO PCP 03-14-2014 No date: Bladder cancer (HCC) No date: CAD (coronary artery disease)     Comment:  a. 07/2014 low risk MV; b. 07/2019 Cor CTA (FFR): LM               25-49 (nl), LAD mild prox/mid plaque (nl), D1 25-49p(nl               w/ abnl FFR of 0.73 in inf branch), LCX/OM1 mild prox/mid              plaque (nl), RCA nondominant, minimal Ca2+ plaque (nl),               RPDA (mildly abnl @ 0.79)-->Med Rx.. No date: Cataract     Comment:  surgically removed bilateral No date: Condyloma acuminatum of penis No date: Diabetic neuropathy (HCC) No date: Diastolic dysfunction     Comment:  a. 05/2019 Echo: EF 60-65%, no rwam, mod LVH, impaired               relaxation, nl RV size/fxn, trace MR, triv TR. No date: Diverticulosis No date: GERD  (gastroesophageal reflux disease) No date: History of bladder cancer     Comment:  s/p  turbt  2013/   transitional cell carcinoma--  No date: History of condyloma acuminatum     Comment:  PERINEAL AREA  W/ RECURRENCY No date: History of gout No date: Hyperlipidemia No date: Hypertension 03/09/2024: Hypoxemia No date: Lower urinary tract symptoms (LUTS) No date: PAF (paroxysmal atrial fibrillation) (HCC)     Comment:  a. 06/2019 Event monitor: PAF <1% burden. Longest 3 mins              36 secs. No date: Productive cough No date: PSVT (paroxysmal supraventricular tachycardia)     Comment:  a. 06/2019 Event monitor: 112 episodes of SVT, longest               21 secs. No date: PVD (peripheral vascular disease) with claudication     Comment:  a. 03/2014 LE art duplex: long segment occlusion of mid  to distal R SFA; b. 03/2020 ABI: nl left and mildly               improved R ABI->med rx. No date: Renal artery stenosis No date: Renal artery stenosis     Comment:  a. 12/2020 Renal art duplex: RRA 1-59%, LRA >60%. No date: Smokers' cough (HCC) 1992: Type 2 diabetes mellitus with insulin  therapy (HCC)     Comment:  monitor by  dr ellsion No date: Wears dentures     Comment:  upper  Past Surgical History: 02/15/2024: ABDOMINAL AORTOGRAM W/LOWER EXTREMITY; N/A     Comment:  Procedure: ABDOMINAL AORTOGRAM W/LOWER EXTREMITY;                Surgeon: Pearline Norman RAMAN, MD;  Location: [facility] INVASIVE CV               LAB;  Service: Cardiovascular;  Laterality: N/A; 08/15/2024: ABDOMINAL AORTOGRAM W/LOWER EXTREMITY; N/A     Comment:  Procedure: ABDOMINAL AORTOGRAM W/LOWER EXTREMITY;                Surgeon: Marea Selinda RAMAN, MD;  Location: [facility] INVASIVE CV               LAB;  Service: Cardiovascular;  Laterality: N/A; 01/02/2024: AMPUTATION; Left     Comment:  Procedure: AMPUTATION, FOOT, RAY;  Surgeon: Malvin Marsa FALCON, DPM;  Location: [facility] OR;  Service:                Orthopedics/Podiatry;  Laterality: Left;  FIFTH TOE 02/29/2024: AMPUTATION TOE; Left     Comment:  Procedure: AMPUTATION, LEFT GREAT TOE;  Surgeon:               Malvin Marsa FALCON, DPM;  Location: [facility] OR;  Service:              Orthopedics/Podiatry;  Laterality: Left;  Left Great toe               partial amputation,  Debridement Left Heel 03/29/2024: AMPUTATION TOE; Left     Comment:  Procedure: AMPUTATION LEFT GREAT TOE;  Surgeon:               Malvin Marsa FALCON, DPM;  Location: [facility] OR;  Service:              Orthopedics/Podiatry;  Laterality: Left;  LEFT GREAT TOE 08/17/2024: APPLICATION OF CELL SAVER; Left     Comment:  Procedure: APPLICATION OF CELL SAVER;  Surgeon: Marea Selinda RAMAN, MD;  Location: [facility] ORS;  Service: Vascular;                Laterality: Left;  SFA thrombectomy and stent 1997: AXILLARY HIDRADENITIS EXCISION 07-24-2014  dr darron grass: CARDIOVASCULAR STRESS TEST     Comment:  Low risk lexiscan  nuclear study with apical thinning and              small inferolateral wall infarct at mid & basal level ,               no ischemia/  normal LVF and wall motion , ef 59% No date: CATARACT EXTRACTION; Left No date: CATARACT EXTRACTION W/ INTRAOCULAR LENS IMPLANT; Right 03/20/2014: CO2 LASER APPLICATION; N/A     Comment:  Procedure: CO2 LASER APPLICATION,PENIS, GROIN, ANUS;  Surgeon: Elspeth KYM Schultze, MD;  Location: [facility];  Service: General;  Laterality: N/A; 05/21/2015: CO2 LASER APPLICATION; N/A     Comment:  Procedure: CO2 LASER APPLICATION;  Surgeon: Mark               Ottelin, MD;  Location: [facility];                Service: Urology;  Laterality: N/A; 05/21/2015: CONDYLOMA EXCISION/FULGURATION; N/A     Comment:  Procedure: CONDYLOMA REMOVAL;  Surgeon: Mark Ottelin,               MD;  Location: [facility] [place] [place];  Service:               Urology;  Laterality: N/A; 08/16/2024:  CORONARY PRESSURE/FFR STUDY; N/A     Comment:  Procedure: CORONARY PRESSURE/FFR STUDY;  Surgeon: Mady Bruckner, MD;  Location: [facility] INVASIVE CV LAB;                Service: Cardiovascular;  Laterality: N/A; 08/17/2024: ENDARTERECTOMY FEMORAL; Left     Comment:  Procedure: ENDARTERECTOMY, FEMORAL;  Surgeon: Marea Selinda RAMAN, MD;  Location: [facility] ORS;  Service: Vascular;                Laterality: Left; 10/24/2014: HEMORRHOID SURGERY     Comment:  Procedure: HEMORRHOIDECTOMY;  Surgeon: Elspeth Schultze, MD;              Location: [facility];  Service: General;; 10/24/2014: INCISION AND DRAINAGE ABSCESS; Left     Comment:  Procedure: INCISION AND DRAINAGE ABSCESS;  Surgeon:               Elspeth Schultze, MD;  Location: [facility];               Service: General;  Laterality: Left; 03/29/2024: INCISION AND DRAINAGE OF WOUND; Left     Comment:  Procedure: IRRIGATION AND DEBRIDEMENT PLANTAR FOOT               WOUND;  Surgeon: Malvin Marsa FALCON, DPM;  [facility];  Service: Orthopedics/Podiatry;  Laterality: Left;              LEFT GREAT TOE 1998, 1999: INGUINAL HIDRADENITIS EXCISION 08/17/2024: INSERTION OF ILIAC STENT; Left     Comment:  Procedure: INSERTION, STENT, ARTERY, ILIAC;  Surgeon:               Marea Selinda RAMAN, MD;  Location: [facility] ORS;  Service:               Vascular;  Laterality: Left;  SFA X2 03/20/2014: LASER ABLATION CONDOLAMATA; N/A     Comment:  Procedure: EXAM UNDER ANESTHESIA, REMOVAL/ABLATION OF               CONDYLOMATA PENIS,GROINS, ANUS, ANAL CANAL;  Surgeon:               Elspeth KYM Schultze, MD;  Location: [facility];  Service: General;  Laterality: N/A;  groin and  anus 10/24/2014: LASER ABLATION CONDOLAMATA; N/A     Comment:  Procedure: LASER ABLATION CONDOLAMATA;  Surgeon: Elspeth Schultze, MD;  Location: [facility];                 Service: General;  Laterality: N/A; 07-29-2007  Dr. Schultze: LASER ABLATION OF PENILE AND PERIANAL WARTS 2003: LEFT SHOULDER SURGERY 01/01/2024: LOWER EXTREMITY ANGIOGRAPHY; N/A     Comment:  Procedure: Lower Extremity Angiography;  Surgeon: Sheree Penne Bruckner, MD;  Location: [facility] INVASIVE CV LAB;                Service: Vascular;  Laterality: N/A; 02/15/2024: LOWER EXTREMITY ANGIOGRAPHY; N/A     Comment:  Procedure: Lower Extremity Angiography;  Surgeon:               Pearline Norman RAMAN, MD;  Location: [facility] INVASIVE CV LAB;                Service: Cardiovascular;  Laterality: N/A; 01/01/2024: LOWER EXTREMITY INTERVENTION     Comment:  Procedure: LOWER EXTREMITY INTERVENTION;  Surgeon: Sheree Penne Bruckner, MD;  Location: [facility] INVASIVE CV LAB;                Service: Vascular;; 02/15/2024: LOWER EXTREMITY INTERVENTION; N/A     Comment:  Procedure: LOWER EXTREMITY INTERVENTION;  Surgeon:               Pearline Norman RAMAN, MD;  Location: [facility] INVASIVE CV LAB;                Service: Cardiovascular;  Laterality: N/A; 10/24/2014: MASS EXCISION; N/A     Comment:  Procedure: EXCISION OF PERINEAL MASS/SINUS;  Surgeon:               Elspeth Schultze, MD;  Location: [facility];               Service: General;  Laterality: N/A; No date: MOHS SURGERY     Comment:  back 2017: MOHS SURGERY     Comment:  face No date: MULTIPLE TOOTH EXTRACTIONS 1998, 1999: PERINEAL HIDRADENITIS EXCISION 08/16/2024: RIGHT/LEFT HEART CATH AND CORONARY ANGIOGRAPHY; N/A     Comment:  Procedure: RIGHT/LEFT HEART CATH AND CORONARY               ANGIOGRAPHY;  Surgeon: Mady Bruckner, MD;  Location:               [facility] INVASIVE CV LAB;  Service: Cardiovascular;                Laterality: N/A; 08/17/2024: THROMBECTOMY FEMORAL ARTERY; Left     Comment:  Procedure: THROMBECTOMY, ARTERY, FEMORAL;  Surgeon: Marea Selinda RAMAN, MD;  Location: [facility] ORS;  Service: Vascular;                 Laterality: Left; 05/21/2012: TRANSURETHRAL RESECTION OF BLADDER TUMOR     Comment:  Procedure: TRANSURETHRAL RESECTION OF BLADDER TUMOR               (TURBT);  Surgeon: Mark C Ottelin, MD;  Location: [place]  [facility];  Service: Urology;  Laterality: N/A;                BMI    Body Mass Index: 21.40 kg/m      Reproductive/Obstetrics negative OB ROS                              Anesthesia Physical Anesthesia Plan  ASA: 3  Anesthesia Plan: General   Post-op Pain Management: Regional block*   Induction: Intravenous  PONV Risk Score and Plan: 2 and Propofol  infusion and TIVA  Airway Management Planned: LMA  Additional Equipment:   Intra-op Plan:   Post-operative Plan: Extubation in OR  Informed Consent: I have reviewed the patients History and Physical, chart, labs and discussed the procedure including the risks, benefits and alternatives for the proposed anesthesia with the patient or authorized representative who has indicated his/her understanding and acceptance.     Dental Advisory Given  Plan Discussed with: Anesthesiologist, CRNA and Surgeon  Anesthesia Plan Comments: (Patient consented for risks of anesthesia including but not limited to:  - adverse reactions to medications - risk of airway placement if required - damage to eyes, teeth, lips or other oral mucosa - nerve damage due to positioning  - sore throat or hoarseness - Damage to heart, brain, nerves, lungs, other parts of body or loss of life  Patient voiced understanding and assent.)         Anesthesia Quick Evaluation  "

## 2024-08-18 NOTE — Progress Notes (Signed)
 During report this am, third shift RN stated that orders were given to use cuff pressures verses A-line. Messaged Dr. Norval and stated that it may have been vascular that gave orders. Reached out to vascular for clarification. Per Dr. Norval he does not see a indication needed for A-line. Awaiting for a response.

## 2024-08-18 NOTE — Progress Notes (Addendum)
 Initial Nutrition Assessment  DOCUMENTATION CODES:   Severe malnutrition in context of chronic illness  INTERVENTION:   Nepro Shake po TID, each supplement provides 425 kcal and 19 grams protein  MVI po daily   Vitamin C  500mg  po BID   Pt at high refeed risk; recommend monitor potassium, magnesium  and phosphorus labs daily until stable  Daily weights   NUTRITION DIAGNOSIS:   Severe Malnutrition related to cancer and cancer related treatments as evidenced by severe fat depletion, severe muscle depletion.  GOAL:   Patient will meet greater than or equal to 90% of their needs  MONITOR:   PO intake, Supplement acceptance, Labs, Weight trends, I & O's, Skin  REASON FOR ASSESSMENT:   Malnutrition Screening Tool    ASSESSMENT:   69 y/o male with h/o HTN, bladder cancer, PAD, CAD, PAF, DM, CHF, asthma, anxiety and diverticulosis who is admitted with gangrene left forefoot now s/p thrombectomy 1/7 and transmetatarsal amputation 1/8.  Met with pt in room today. Pt reports good appetite and oral intake at baseline. Pt reports that he is hungry today and is upset that he is NPO for scheduled amputation. Pt reports that he loves Ensure supplements and drinks these at home. Pt would like butter pecan Nepro in hospital. Pt wears dentures but denies any issues with chewing or swallowing. RD discussed with pt the importance of adequate nutrition needed to preserve lean muscle and to support wound healing. RD will add supplements and vitamins to help pt meet his estimated needs. Pt is at high refeed risk. Per chart, pt appears weight stable at baseline.    Medications reviewed and include: aspirin , celexa , plavix , lasix , insulin , protonix , aldactone , zosyn    Labs reviewed: K 4.0 wnl Vitamin D  28.46(L)- 7/14 Wbc- 11.6(H) Cbgs- 310, 191, 194 x 24 hrs  AIC 11.2(H)- 7/14  UOP-   NUTRITION - FOCUSED PHYSICAL EXAM:  Flowsheet Row Most Recent Value  Orbital Region Moderate  depletion  Upper Arm Region Severe depletion  Thoracic and Lumbar Region Severe depletion  Buccal Region Moderate depletion  Temple Region Moderate depletion  Clavicle Bone Region Severe depletion  Clavicle and Acromion Bone Region Severe depletion  Scapular Bone Region Moderate depletion  Dorsal Hand Severe depletion  Patellar Region Severe depletion  Anterior Thigh Region Severe depletion  Posterior Calf Region Severe depletion  Edema (RD Assessment) Mild  Hair Reviewed  Eyes Reviewed  Mouth Reviewed  Skin Reviewed  Nails Reviewed   Diet Order:   Diet Order             Diet Carb Modified Room service appropriate? Yes  Diet effective now                  EDUCATION NEEDS:   Education needs have been addressed  Skin:  Skin Assessment: Reviewed RN Assessment (incison L foot, diabetic foot wound L heel, Stage I coccyx)  Last BM:  1/6- type 5  Height:   Ht Readings from Last 1 Encounters:  08/18/24 5' 11 (1.803 m)    Weight:   Wt Readings from Last 1 Encounters:  08/18/24 69.6 kg    Ideal Body Weight:  78 kg  BMI:  Body mass index is 21.4 kg/m.  Estimated Nutritional Needs:   Kcal:  2000-2300kcal/day  Protein:  100-115g/day  Fluid:  1.8-2.1L/day  Augustin Shams MS, RD, LDN If unable to be reached, please send secure chat to RD inpatient available from 8:00a-4:00p daily

## 2024-08-18 NOTE — Transfer of Care (Signed)
 Immediate Anesthesia Transfer of Care Note  Patient: [name]  Procedure(s) Performed: AMPUTATION, FOOT, TRANSMETATARSAL (Left: Toe)  Patient Location: PACU  Anesthesia Type:General  Level of Consciousness: awake, alert , oriented, and patient cooperative  Airway & Oxygen Therapy: Patient Spontanous Breathing and Patient connected to face mask oxygen  Post-op Assessment: Report given to RN, Post -op Vital signs reviewed and stable, and Patient moving all extremities X 4  Post vital signs: Reviewed and stable  Last Vitals:  Vitals Value Taken Time  BP 114/51 08/18/24 13:30  Temp    Pulse 63 08/18/24 13:31  Resp 12 08/18/24 13:31  SpO2 100 % 08/18/24 13:31  Vitals shown include unfiled device data.  Last Pain:  Vitals:   08/18/24 1214  TempSrc:   PainSc: 0-No pain      Patients Stated Pain Goal: 0 (08/12/24 0533)  Complications: No notable events documented.

## 2024-08-18 NOTE — Progress Notes (Signed)
 Per Dr. Norval removed A-line.

## 2024-08-18 NOTE — Progress Notes (Unsigned)
 "  Cardiology Office Note    Date:  08/18/2024   ID:  Paul, [name] Nov 24, 1955, MRN [id number]  PCP:  Norleen Lynwood LELON, MD  Cardiologist:  Deatrice Cage, MD  Electrophysiologist:  None   Chief Complaint: ***  History of Present Illness:   [name] is a 69 y.o. male with history of nonobstructive CAD by coronary CTA in 2020, HFrEF, PAF, PAD s/p multiple interventions complicated by osteomyelitis s/p toe amputation, insulin -dependent diabetes, refractory hypertension, hyperlipidemia, bladder cancer, and ongoing tobacco use who is being seen for***.    Patient has a long history of PAD, undergoing multiple interventions over the years dating back to at least 2015.  In late 2015, he had atypical chest pain with nuclear stress testing showing small inferolateral infarct with no significant ischemia.  This was medically managed given that chest pain had resolved.  He was admitted in 2020 with sepsis secondary to UTI and pyelonephritis as well as hypertensive urgency.  He was noted to have coronary artery calcification on chest CT.  Echo with EF 60 to 65%, moderate LVH, and no significant valvular abnormalities.  He was noted to have short runs of SVT on telemetry in the setting of electrolyte abnormalities.  Subsequent coronary CTA 07/2019 showed calcium  score of 412 with mild left main stenosis and mild first diagonal stenosis that were not significant by CT FFR.  Zio in 2020 showed predominantly normal sinus rhythm with frequent short runs of SVT as well as PAF with a burden of less than 1%.  Burden of atrial fibrillation was not felt to justify initiation of anticoagulation at that time.  Renal artery duplex in 2020 was suboptimal and showed no significant right renal artery stenosis with borderline stenosis affecting the left renal artery.  Patient was seen in the ED 07/2024 with shortness of breath felt to be related to volume overload as well as recent mechanical fall.  BNP 801.  Chest x-ray  with left pleural effusion and acute left seventh rib fracture without evidence of pneumothorax.  He was prescribed furosemide  20 mg daily.  He was admitted to an outside hospital 07/2024 with sepsis due to right lower lobe pneumonia with admission complicated by COPD exacerbation, CHF exacerbation, atrial fibrillation with RVR, and electrolyte derangement.  Echo during that admission demonstrated EF of 30 to 35%.  He was initiated on GDMT including metoprolol  succinate, Entresto, and spironolactone .  He was treated for his A-fib with RVR initially with IV amiodarone  which was transitioned to oral amiodarone  prior to discharge.  He was also initiated on apixaban .   Recent admission 08/10/2024 for cellulitis of the left foot complicated by gangrene initially treated with IV vancomycin  and Zosyn  with wound culture growing Staph aureus, Morganella, and Enterococcus.  He had been followed by internal medicine, podiatry, and vascular surgery.  MRI without evidence of osteomyelitis.  He underwent lower extremity angiogram 08/15/2024 which demonstrated a napkin ring like lesion in the distal left common femoral artery just above the femoral bifurcation associated with severe calcific disease in the more proximal distal mid left common femoral artery of greater than 80% stenosis.  Vascular surgery recommended femoral endarterectomy with possible bypass and transmetatarsal amputation.  Cardiology was consulted for risk stratification prior to procedure.  At time of cardiology consult patient was without symptoms of angina or cardiac decompensation but did report a several week history of exertional chest pain and dyspnea with associated palpitations as well as several episodes of unsteady gait leading  to mechanical falls.  He underwent right and left heart catheterization 08/17/2023 which demonstrated nonobstructive CAD, moderately to severely reduced LV systolic function (EF 30 to 35% with global hypokinesis) with normal  cardiac output, mildly elevated left heart filling pressures, and moderate pulmonary hypertension.  Additional cardiac interventions were not felt to be likely to further mitigate his high perioperative risk. ***  Labs independently reviewed: ***  Objective   Past Medical History:  Diagnosis Date   Adenomatous colon polyp    Allergic rhinitis    At risk for sleep apnea    STOP-BANG= 5   SENT TO PCP 03-14-2014   Bladder cancer (HCC)    CAD (coronary artery disease)    a. 07/2014 low risk MV; b. 07/2019 Cor CTA (FFR): LM 25-49 (nl), LAD mild prox/mid plaque (nl), D1 25-49p(nl w/ abnl FFR of 0.73 in inf branch), LCX/OM1 mild prox/mid plaque (nl), RCA nondominant, minimal Ca2+ plaque (nl), RPDA (mildly abnl @ 0.79)-->Med Rx..   Cataract    surgically removed bilateral   Condyloma acuminatum of penis    Diabetic neuropathy (HCC)    Diastolic dysfunction    a. 05/2019 Echo: EF 60-65%, no rwam, mod LVH, impaired relaxation, nl RV size/fxn, trace MR, triv TR.   Diverticulosis    GERD (gastroesophageal reflux disease)    History of bladder cancer    s/p  turbt  2013/   transitional cell carcinoma--    History of condyloma acuminatum    PERINEAL AREA  W/ RECURRENCY   History of gout    Hyperlipidemia    Hypertension    Hypoxemia 03/09/2024   Lower urinary tract symptoms (LUTS)    PAF (paroxysmal atrial fibrillation) (HCC)    a. 06/2019 Event monitor: PAF <1% burden. Longest 3 mins 36 secs.   Productive cough    PSVT (paroxysmal supraventricular tachycardia)    a. 06/2019 Event monitor: 112 episodes of SVT, longest 21 secs.   PVD (peripheral vascular disease) with claudication    a. 03/2014 LE art duplex: long segment occlusion of mid to distal R SFA; b. 03/2020 ABI: nl left and mildly improved R ABI->med rx.   Renal artery stenosis    Renal artery stenosis    a. 12/2020 Renal art duplex: RRA 1-59%, LRA >60%.   Smokers' cough (HCC)    Type 2 diabetes mellitus with insulin  therapy (HCC)  1992   monitor by  dr ellsion   Wears dentures    upper    Current Medications: Active Medications[1]  Allergies:   Cefepime , Metoprolol , and Myrbetriq  [mirabegron ]   Social History   Socioeconomic History   Marital status: Widowed    Spouse name: Not on file   Number of children: 0   Years of education: Not on file   Highest education level: Not on file  Occupational History   Occupation: Disabled/retired  Tobacco Use   Smoking status: Every Day    Current packs/day: 0.00    Average packs/day: 1 pack/day for 41.0 years (41.0 ttl pk-yrs)    Types: Cigars, Cigarettes    Start date: 40    Last attempt to quit: 2017    Years since quitting: 9.0   Smokeless tobacco: Never   Tobacco comments:    Quit cigarettes in 2017 but started back smoking Cigars  Vaping Use   Vaping status: Former  Substance and Sexual Activity   Alcohol use: No    Alcohol/week: 0.0 standard drinks of alcohol   Drug use: No  Sexual activity: Not Currently  Other Topics Concern   Not on file  Social History Narrative   Not on file   Social Drivers of Health   Tobacco Use: High Risk (08/18/2024)   Patient History    Smoking Tobacco Use: Every Day    Smokeless Tobacco Use: Never    Passive Exposure: Not on file  Financial Resource Strain: High Risk (03/11/2024)   Overall Financial Resource Strain (CARDIA)    Difficulty of Paying Living Expenses: Very hard  Food Insecurity: Food Insecurity Present (08/11/2024)   Epic    Worried About Programme Researcher, Broadcasting/film/video in the Last Year: Often true    Ran Out of Food in the Last Year: Often true  Transportation Needs: Unmet Transportation Needs (08/11/2024)   Epic    Lack of Transportation (Medical): Yes    Lack of Transportation (Non-Medical): Yes  Physical Activity: Inactive (06/10/2023)   Exercise Vital Sign    Days of Exercise per Week: 0 days    Minutes of Exercise per Session: 0 min  Stress: Stress Concern Present (03/11/2024)   [employer] of  Occupational Health - Occupational Stress Questionnaire    Feeling of Stress: Very much  Social Connections: Socially Isolated (08/11/2024)   Social Connection and Isolation Panel    Frequency of Communication with Friends and Family: More than three times a week    Frequency of Social Gatherings with Friends and Family: Never    Attends Religious Services: Never    Database Administrator or Organizations: No    Attends Banker Meetings: Never    Marital Status: Widowed  Depression (PHQ2-9): Low Risk (07/05/2024)   Depression (PHQ2-9)    PHQ-2 Score: 0  Alcohol Screen: Low Risk (06/10/2023)   Alcohol Screen    Last Alcohol Screening Score (AUDIT): 0  Housing: High Risk (08/11/2024)   Epic    Unable to Pay for Housing in the Last Year: Yes    Number of Times Moved in the Last Year: 0    Homeless in the Last Year: No  Utilities: At Risk (08/15/2024)   Epic    Threatened with loss of utilities: Yes  Health Literacy: Inadequate Health Literacy (02/17/2024)   B1300 Health Literacy    Frequency of need for help with medical instructions: Sometimes     Family History:  The patient's family history includes Diabetes in his maternal aunt; Hypertension in his mother; Lung cancer in his father. There is no history of Colon cancer, Esophageal cancer, Pancreatic cancer, Prostate cancer, Kidney disease, Liver disease, Rectal cancer, or Stomach cancer.  ROS:   12-point review of systems is negative unless otherwise noted in the HPI.  EKGs/Other Studies Reviewed:    Studies reviewed were summarized above. The additional studies were reviewed today: ***  EKG:  EKG personally reviewed by me today    PHYSICAL EXAM:    VS:  There were no vitals taken for this visit.  BMI: There is no height or weight on file to calculate BMI.  GEN: Well nourished, well developed in no acute distress NECK: No JVD; No carotid bruits CARDIAC: ***RRR, no murmurs, rubs, gallops RESPIRATORY:  Clear to  auscultation without rales, wheezing or rhonchi  ABDOMEN: Soft, non-tender, non-distended EXTREMITIES:  *** No edema; No deformity  Wt Readings from Last 3 Encounters:  08/18/24 153 lb 7 oz (69.6 kg)  07/13/24 150 lb (68 kg)  06/17/24 160 lb 12.8 oz (72.9 kg)  ASSESSMENT & PLAN:   ***   {Are you ordering a CV Procedure (e.g. stress test, cath, DCCV, TEE, etc)?   Press F2        :[id number]}   Disposition: F/u with Dr. Darron or an APP in ***.   Medication Adjustments/Labs and Tests Ordered: Current medicines are reviewed at length with the patient today.  Concerns regarding medicines are outlined above. Medication changes, Labs and Tests ordered today are summarized above and listed in the Patient Instructions accessible in Encounters.   Bonney Lesley Maffucci, PA-C 08/18/2024 2:39 PM     [facility] [address] [place], [place] [address] [phone]      [1]  No outpatient medications have been marked as taking for the 08/23/24 encounter (Appointment) with Maffucci Lesley CROME, PA-C.   "

## 2024-08-18 NOTE — Op Note (Signed)
 Full Operative Report  Date of Operation: 1:25 PM, 08/18/2024   Patient: [name] - 69 y.o. male  Surgeon: Malvin Marsa FALCON, DPM   Assistant: None  Diagnosis: Gangrene left forefoot  Procedure:  1. Transmetatarsal amputation, left foot    Anesthesia: Anesthesia type not filed in the log.  Chesley Lendia CROME, MD  Anesthesiologist: Chesley Lendia CROME, MD CRNA: Myra Lawless, CRNA   Estimated Blood Loss: 50 mL  Hemostasis: 1) Anatomical dissection, mechanical compression, electrocautery 2) no tourniquet was used during procedure  Implants: * No implants in log *  Materials: Prolene 2-0 and skin staples  Injectables: 1) Pre-operatively: 10 cc of 50:50 mixture 1%lidocaine  plain and 0.5% marcaine  plain 2) Post-operatively: None   Specimens: - Pathology: Left forefoot - Microbiology: Tissue culture from necrotic tissue left forefoot   Antibiotics: IV antibiotics given per schedule on the floor  Drains: None  Complications: Patient tolerated the procedure well without complication.   Operative findings: As below in detailed report  Indications for Procedure: [name] presents to Malvin Marsa FALCON, DPM with a chief complaint of chronic ulceration plantar first MPJ area with significant necrotic tissue in the plantar forefoot.  Concern for gangrene critical limb ischemia status post revascularization with vascular the patient has failed conservative treatments of various modalities. At this time the patient has elected to proceed with surgical correction. All alternatives, risks, and complications of the procedures were thoroughly explained to the patient. Patient exhibits appropriate understanding of all discussion points and informed consent was signed and obtained in the chart with no guarantees to surgical outcome given or implied.  Description of Procedure: Patient was brought to the operating room. Patient remained on their hospital bed in the supine  position. A surgical timeout was performed and all members of the operating room, the procedure, and the surgical site were identified. anesthesia occurred as per anesthesia record. Local anesthetic as previously described was then injected about the operative field in a local infiltrative block.  The operative lower extremity as noted above was then prepped and draped in the usual sterile manner. The following procedure then began.  Attention was directed to the left lower extremity. A fish-mouth type incision was made proximal to the web spaces and encompassed the entire forefoot. The full-thickness incision was made with a longer dorsal flap in this case due to significant tissue that had to be excised from the plantar aspect of the forefoot due to tissue necrosis.  This was especially bad laterally where there was significant necrosis noted nearly to the fifth metatarsal base.  This was done to allow for wound closure. The incision was continued through the soft tissue down to the shafts of the metatarsal bones. A 15 blade was then used to free up the periosteum on all the metatarsal shafts. Using an oscillating saw, the metatarsals were cut in a dorsal distal to plantar proximal orientation. The first metatarsal was beveled so that the medial cortex was shorter than the lateral, and the fifth metatarsal was beveled so that the lateral cortex was shorter than the medial, thus less prominent. The amputation was done so that a metatarsal parabola was maintained.  The distal portion including all the digits were freed from the metatarsals and soft tissue attachments. The specimen was passed off the field and sent for gross pathology.  A tissue culture was harvested from the necrotic areas of the left forefoot with rongeur and sent for culture.  There was noted to be significant  malodor and necrotic infected tissue in the forefoot that was resected.  All remaining non-viable and necrotic tissues were sharply  resected and removed. Extensor and flexors tendons were grasped with a hemostat and cut proximally.  Good bleeding was seen during the procedure including at the plantar and dorsal flap.  =. The surgical site was then flushed with 1000ml of saline under power pulse lavage. The dorsal flap was brought in approximation with the plantar flap and the sutures material previously described was used for closure. Care was taken not to place the flaps under tension in order not to jeopardize the vascular supply.  The surgical site was then dressed with Xeroform for gauze ABD pad Kerlix Ace wrap loosely wrapped. The patient tolerated both the procedure and anesthesia well with vital signs stable throughout. The patient was transferred in good condition and all vital signs stable  from the OR to recovery under the discretion of anesthesia.  Condition: Vital signs stable, neurovascular status unchanged from preoperative   Surgical plan:  Expect clean surgical margin however the primary concern is for ischemic tissue at the amputation margin.  Especially laterally I debrided as much necrotic tissue as possible to still allow closure and there is some questionable tissue at the margin of the plantar flap on the lateral aspect.  Hopefully this will improve and will not have necrotic dehiscence.  If the transmet amputation site fails due to ischemic changes he will likely need for proximal amputation.  Expect that all infectious material was removed during the procedure and therefore likely okay to transition to oral antibiotics for 7 to 10 days.  Nonweightbearing on the left foot  The patient will be nonweightbearing in a postop shoe to the operative limb until further instructed. The dressing is to remain clean, dry, and intact. Will continue to follow unless noted elsewhere.   Marsa Honour, DPM [facility]

## 2024-08-18 NOTE — Progress Notes (Signed)
 [facility]  Daily Progress Note   Subjective  -   No major events overnight.  Patient seen in the holding area.  He is going back for his transmetatarsal amputation of the left foot today with podiatry.  Appropriate postoperative pain with nothing out of expectation.  Objective Vitals:   08/18/24 0900 08/18/24 0930 08/18/24 1100 08/18/24 1205  BP: (!) 123/51 (!) 115/44 (!) 118/50   Pulse: 62 (!) 59 (!) 59 68  Resp: (!) 9 14 13 16   Temp:      TempSrc:      SpO2: 95% 96% 95% 99%  Weight:      Height:        Intake/Output Summary (Last 24 hours) at 08/18/2024 1209 Last data filed at 08/18/2024 0724 Gross per 24 hour  Intake 1728.45 ml  Output 975 ml  Net 753.45 ml    PULM  CTAB CV  RRR VASC  left foot is currently wrapped with bulky Ace bandage Skin     groin incision is clean, dry, and intact  Laboratory CBC    Component Value Date/Time   WBC 11.6 (H) 08/18/2024 0344   HGB 11.9 (L) 08/18/2024 0344   HCT 34.6 (L) 08/18/2024 0344   PLT 382 08/18/2024 0344    BMET    Component Value Date/Time   NA 138 08/17/2024 1612   NA 138 06/16/2019 1144   K 4.0 08/18/2024 0938   CL 99 08/17/2024 1612   CO2 25 08/17/2024 1612   GLUCOSE 230 (H) 08/17/2024 1612   BUN 19 08/17/2024 1612   BUN 15 06/16/2019 1144   CREATININE 1.02 08/17/2024 1612   CALCIUM  8.9 08/17/2024 1612   GFRNONAA >60 08/17/2024 1612   GFRAA >60 02/29/2020 9062    Assessment/Planning: POD #1 s/p left femoral endarterectomy, left SFA thrombectomy and stent placement  Going his left TMA today with podiatry Perfusion should be optimized Would plan to start Eliquis  and 81 mg aspirin  tomorrow   Selinda Gu  08/18/2024, 12:09 PM

## 2024-08-18 NOTE — Anesthesia Procedure Notes (Signed)
 Procedure Name: LMA Insertion Date/Time: 08/18/2024 12:45 PM  Performed by: Myra Lawless, CRNAPre-anesthesia Checklist: Patient identified, Patient being monitored, Timeout performed, Emergency Drugs available and Suction available Patient Re-evaluated:Patient Re-evaluated prior to induction Oxygen Delivery Method: Circle system utilized Preoxygenation: Pre-oxygenation with 100% oxygen Induction Type: IV induction Ventilation: Mask ventilation without difficulty LMA: LMA inserted LMA Size: 4.0 Tube type: Oral Number of attempts: 1 Placement Confirmation: positive ETCO2 and breath sounds checked- equal and bilateral Tube secured with: Tape Dental Injury: Teeth and Oropharynx as per pre-operative assessment

## 2024-08-19 ENCOUNTER — Encounter: Payer: Self-pay | Admitting: Podiatry

## 2024-08-19 DIAGNOSIS — E43 Unspecified severe protein-calorie malnutrition: Secondary | ICD-10-CM | POA: Insufficient documentation

## 2024-08-19 LAB — GLUCOSE, CAPILLARY
Glucose-Capillary: 371 mg/dL — ABNORMAL HIGH (ref 70–99)
Glucose-Capillary: 414 mg/dL — ABNORMAL HIGH (ref 70–99)
Glucose-Capillary: 276 mg/dL — ABNORMAL HIGH (ref 70–99)
Glucose-Capillary: 266 mg/dL — ABNORMAL HIGH (ref 70–99)
Glucose-Capillary: 223 mg/dL — ABNORMAL HIGH (ref 70–99)

## 2024-08-19 LAB — TYPE AND SCREEN
ABO/RH(D): A POS
Antibody Screen: NEGATIVE
Unit division: 0
Unit division: 0

## 2024-08-19 LAB — BASIC METABOLIC PANEL WITH GFR
Anion gap: 8 (ref 5–15)
Anion gap: 8 (ref 5–15)
BUN: 18 mg/dL (ref 8–23)
BUN: 19 mg/dL (ref 8–23)
CO2: 28 mmol/L (ref 22–32)
CO2: 32 mmol/L (ref 22–32)
Calcium: 8.8 mg/dL — ABNORMAL LOW (ref 8.9–10.3)
Calcium: 9.3 mg/dL (ref 8.9–10.3)
Chloride: 92 mmol/L — ABNORMAL LOW (ref 98–111)
Chloride: 94 mmol/L — ABNORMAL LOW (ref 98–111)
Creatinine, Ser: 0.98 mg/dL (ref 0.61–1.24)
Creatinine, Ser: 1.05 mg/dL (ref 0.61–1.24)
GFR, Estimated: >60 mL/min
GFR, Estimated: >60 mL/min
Glucose, Bld: 403 mg/dL — ABNORMAL HIGH (ref 70–99)
Glucose, Bld: 238 mg/dL — ABNORMAL HIGH (ref 70–99)
Potassium: 4.2 mmol/L (ref 3.5–5.1)
Potassium: 3.9 mmol/L (ref 3.5–5.1)
Sodium: 128 mmol/L — ABNORMAL LOW (ref 135–145)
Sodium: 133 mmol/L — ABNORMAL LOW (ref 135–145)

## 2024-08-19 LAB — BPAM RBC
Blood Product Expiration Date: 202602022359
Blood Product Expiration Date: 202602032359
Unit Type and Rh: 6200
Unit Type and Rh: 6200

## 2024-08-19 LAB — CBC
HCT: 31.7 % — ABNORMAL LOW (ref 39.0–52.0)
Hemoglobin: 11 g/dL — ABNORMAL LOW (ref 13.0–17.0)
MCH: 32.1 pg (ref 26.0–34.0)
MCHC: 34.7 g/dL (ref 30.0–36.0)
MCV: 92.4 fL (ref 80.0–100.0)
Platelets: 364 K/uL (ref 150–400)
RBC: 3.43 MIL/uL — ABNORMAL LOW (ref 4.22–5.81)
RDW: 13.3 % (ref 11.5–15.5)
WBC: 13.7 K/uL — ABNORMAL HIGH (ref 4.0–10.5)
nRBC: 0 % (ref 0.0–0.2)

## 2024-08-19 LAB — PHOSPHORUS: Phosphorus: 2.8 mg/dL (ref 2.5–4.6)

## 2024-08-19 LAB — GLUCOSE, RANDOM: Glucose, Bld: 485 mg/dL — ABNORMAL HIGH (ref 70–99)

## 2024-08-19 LAB — MAGNESIUM: Magnesium: 1.4 mg/dL — ABNORMAL LOW (ref 1.7–2.4)

## 2024-08-19 LAB — PREPARE RBC (CROSSMATCH)

## 2024-08-19 MED ORDER — INSULIN ASPART 100 UNIT/ML IJ SOLN
2.0000 [IU] | Freq: Three times a day (TID) | INTRAMUSCULAR | Status: DC
  Administered 2024-08-19 (×2): 2 [IU] via SUBCUTANEOUS
  Filled 2024-08-19 (×2): qty 2

## 2024-08-19 MED ORDER — INSULIN ASPART 100 UNIT/ML IJ SOLN
2.0000 [IU] | Freq: Once | INTRAMUSCULAR | Status: AC
  Administered 2024-08-19: 2 [IU] via SUBCUTANEOUS
  Filled 2024-08-19: qty 2

## 2024-08-19 MED ORDER — APIXABAN 5 MG PO TABS
5.0000 mg | ORAL_TABLET | Freq: Two times a day (BID) | ORAL | Status: DC
  Administered 2024-08-19 – 2024-08-22 (×7): 5 mg via ORAL
  Filled 2024-08-19 (×7): qty 1

## 2024-08-19 MED ORDER — INSULIN GLARGINE 100 UNIT/ML ~~LOC~~ SOLN
15.0000 [IU] | Freq: Every day | SUBCUTANEOUS | Status: DC
  Administered 2024-08-19: 15 [IU] via SUBCUTANEOUS
  Filled 2024-08-19 (×2): qty 0.15

## 2024-08-19 MED ORDER — INSULIN ASPART 100 UNIT/ML IJ SOLN
7.0000 [IU] | Freq: Once | INTRAMUSCULAR | Status: AC
  Administered 2024-08-19: 7 [IU] via SUBCUTANEOUS
  Filled 2024-08-19: qty 7

## 2024-08-19 NOTE — Progress Notes (Signed)
"  °  Subjective:  Patient ID: [name], male    DOB: [birth year],  MRN: [id number]  Chief Complaint  Patient presents with   Fall    DOS: 08/18/2024 Procedure: Transmetatarsal amputation, left foot  69 y.o. male seen for post op check.  Patient reports pain in his left foot this morning.  Says he is still working on pain control.  Discussed staying off the left foot.  He denied any concerns.  Review of Systems: Negative except as noted in the HPI. Denies N/V/F/Ch.   Objective:   Constitutional Well developed. Well nourished.  Vascular Foot warm and well perfused. Capillary refill normal to all digits.   No calf pain with palpation  Neurologic Normal speech. Oriented to person, place, and time. Epicritic sensation diminished to left foot  Dermatologic Dressings clean dry intact left foot  Orthopedic: Status post left foot TMA   Radiographs: 1. Status post transmetatarsal amputation of the first through fifth ray with expected postsurgical changes manifested by soft tissue swelling and subcutaneous emphysema along the plantar surface of the forefoot.  Pathology: Pending  Micro: Few gram-negative rods and abundant gram-positive cocci  Assessment:   1. Left foot infection   2. 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  POD # 1 s/p left foot transmetatarsal amputation -Progressing as expected postoperatively still working on pain control.  Maintain nonweightbearing PT OT today -Overall hopeful for healing however there was some questionable tissue used for closure laterally due to significant tissue necrosis on the plantar lateral flap -XR: Expected postoperative changes -WB Status: Nonweightbearing in postop shoe left lower extremity -Sutures: Remain intact. -Medications/ABX: Recommend transition to 7 days p.o. antibiotics -Dressing: Plan for dressing change  tomorrow - F/u Plan: Will continue to follow plan for dressing change over the weekend with Dr. Tobie who is covering for us         Marolyn JULIANNA Honour, DPM [facility] / [facility]  "

## 2024-08-19 NOTE — Progress Notes (Signed)
 Patients CBG 414, notified Dr. Norval , giving 2 units per order and then his lantus  .

## 2024-08-19 NOTE — Progress Notes (Signed)
 " PROGRESS NOTE    [name]  FMW:[id number] DOB: 1956/02/23 DOA: 08/10/2024 PCP: Norleen Lynwood LELON, MD  Subjective: No acute events overnight. Seen and examined at bedside. Reports pain controlled. Tolerated amputation procedure well. Pain tolerable with meds. Denies nausea, vomiting, constipation.     Hospital Course: 69 y.o. male with medical history significant for PAD, osteomyelitis s/p toe amputation, DM, HTN, bladder cancer, paroxysmal atrial fibrillation on Eliquis , , depression being admitted for cellulitis left foot with possible osteomyelitis.  He was admitted for the same in August 2025, undergoing amputation of the left great toe at that time, and he was treated with meropenem -> Cipro  to complete 4 weeks after OR cultures grew Pseudomonas, Enterobacter and Proteus.  Today he presented by EMS with pain and swelling of the left foot with redness as well as dizziness and weakness, which caused him to fall hitting his head on 1 occasion but without loss of consciousness.  He reports not having electricity in the house and cannot power his cell phone.  He decided to come into the ED to get checked out. In the ED, temp of 99.2 with otherwise normal vitals.  Labs notable for WBC 19,000 but otherwise unremarkable. EKG with no acute concerning findings Venous ultrasound negative for DVT Foot x-ray showed the following: IMPRESSION: 1. Soft tissue swelling along the plantar surface of the forefoot at the amputation site with subcutaneous gas. 2. Mild cortical irregularity along the medial margin of the remaining first metatarsal head, which could indicate osteomyelitis. 3. 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   The ED provider reached out to podiatrist, Dr.  Lamount who recommended Zosyn  and vancomycin  and get MRI.  No mention of OR  MRI of the foot did not show any convincing evidence of acute osteomyelitis at this time.  1/2.  Patient was  contemplating leaving the hospital.  I told him we need to get lab draws if were on heparin  drip. 1/3.  Sugars finally up we will restart Lantus  insulin .  Patient states that he does not have transportation at home or power.  He is interested in returning home.  Morganella resistant to all oral antibiotics. 1/4.  Staph aureus in the wound sensitive to oxacillin, Enterococcus in the wound sensitive to ampicillin .  Zosyn  would cover all of these organisms.  Discontinue vancomycin .  For angiogram tomorrow. 1/5.  Angiogram today.  Case discussed with podiatry and will need at least a transmet amputation but further discussion was may be needed depending on angiogram results. 1/6.  Cardiology planning on cardiac catheterization this afternoon for cardiac clearance for femoral endarterectomy tomorrow.   Assessment and Plan:  Gangrene (HCC) PAD (peripheral artery disease) Tissue on the bottom of the foot and second toe does not look good.  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   Appreciate podiatry and vascular surgery consultations.  MRI does not show  any signs of osteomyelitis but tissue on the bottom of the foot does not look good.  Wound culture growing moderate Staph aureus, Morganella and Enterococcus.   Morganella does show resistance and we do not have an oral choice.   - Patient started on aggressive antibiotics initially with  vancomycin  and Zosyn .  - Vancomycin  discontinued on 1/4  - cont Zosyn . Initial plan to continue for 7 days post TMA. Discussed with podiatry 1/9 who recommended he can finish the course on 1/11 as the surgery likely removed most of not all all infected tissue.    Uncontrolled type 2 diabetes mellitus with hypoglycemia, with long-term current use of insulin  [facility]) Patient had a sugar of 38 on 08/11/2024.  Restarted lower dose of Lantus  2 days ago tjat was lowered yesterday in anticipation for surgeries.   - Continue to monitor sugars closely.     PAF (paroxysmal atrial fibrillation) (HCC) Continue amiodarone  Resume eliquis    CAD (coronary artery disease) Resume asa Cont atorvastatin  Resume eliquis    Essential hypertension Continue irbesartan , spironolactone  and Lasix    Bladder cancer (HCC) Follow with oncology outpatient   Housing instability TOC following   Tobacco abuse Nicotine patch.  Patient must stop smoking.  DVT prophylaxis:  apixaban  (ELIQUIS ) tablet 5 mg  Eliquis    Code Status: Full Code  Disposition Plan: TBD Reason for continuing need for hospitalization: IV antibiotics, unclear dispo plan as home up for foreclosure and no electricity at home despite patient paying his bills, trying to get further input from [facility] team  Objective: Vitals:   08/19/24 0017 08/19/24 0500 08/19/24 0504 08/19/24 0800  BP:   (!) 150/73 (!) 157/62  Pulse:   73 74  Resp: 18  17 17   Temp:   98.7 F (37.1 C) 98.2 F (36.8 C)  TempSrc:   Oral Oral  SpO2:   98% 95%  Weight:  72.9 kg    Height:        Intake/Output Summary (Last 24 hours) at 08/19/2024 1315 Last data filed at 08/19/2024 1032 Gross per 24 hour  Intake 825.1 ml  Output 1855 ml  Net -1029.9 ml   Filed Weights   08/17/24 1340 08/18/24 1214 08/19/24 0500  Weight: 69.6 kg 69.6 kg 72.9 kg    Examination:  Physical Exam Vitals and nursing note reviewed.  Constitutional:      General: He is not in acute distress.     Appearance: He is ill-appearing.  HENT:     Head: Normocephalic and atraumatic.  Cardiovascular:     Rate and Rhythm: Normal rate and regular rhythm.     Pulses: Normal pulses.     Heart sounds: Normal heart sounds.  Pulmonary:     Effort: Pulmonary effort is normal.     Breath sounds: Normal breath sounds.  Abdominal:     General: Bowel sounds are normal. There is no distension.     Palpations: Abdomen is soft.     Tenderness: There is no abdominal tenderness.  Neurological:     Mental Status: He is alert.     Data Reviewed: I have personally reviewed following labs and imaging studies  CBC: Recent Labs  Lab 08/15/24 0534 08/16/24 0436 08/16/24 1455 08/16/24 1457 08/17/24 0221 08/18/24 0344 08/19/24 0500  WBC 10.7* 10.7*  --   --  11.4* 11.6* 13.7*  HGB 12.4* 12.6* 12.2* 11.9* 13.0 11.9* 11.0*  HCT 36.2* 36.2* 36.0* 35.0* 38.0* 34.6* 31.7*  MCV 93.8 92.8  --   --  93.4 93.8 92.4  PLT 330 386  --   --  401* 382 364   Basic Metabolic Panel: Recent Labs  Lab 08/13/24 0706 08/14/24 0538 08/15/24 0534 08/16/24 1455 08/16/24 1457 08/16/24 1843 08/17/24 1612 08/18/24 0938 08/18/24 2235 08/19/24 0500  NA 135  --   --  130* 134* 137 138  --   --  128*  K 3.7  --   --  3.4* 3.5 3.8 5.0 4.0  --  4.2  CL 98  --   --   --   --  95* 99  --   --  92*  CO2 29  --   --   --   --  32 25  --   --  28  GLUCOSE 232*  --   --   --   --  190* 230*  --  485* 403*  BUN 15  --   --   --   --  17 19  --   --  18  CREATININE 0.83 0.91 0.96  --   --  1.08 1.02  --   --  0.98  CALCIUM  8.6*  --   --   --   --  9.1 8.9  --   --  8.8*  MG  --   --   --   --   --   --   --   --   --  1.4*  PHOS  --   --   --   --   --   --   --   --   --  2.8   GFR: Estimated Creatinine Clearance: 75.4 mL/min (by C-G formula based on SCr of 0.98 mg/dL). Liver Function Tests: No results for input(s): AST, ALT, ALKPHOS, BILITOT, PROT, ALBUMIN  in the last 168 hours. No results for input(s):  LIPASE, AMYLASE in the last 168 hours. No results for input(s): AMMONIA in the last 168 hours. Coagulation Profile: No results for input(s): INR, PROTIME in the last 168 hours. Cardiac Enzymes: No results for input(s): CKTOTAL, CKMB, CKMBINDEX, TROPONINI in the last 168 hours. ProBNP, BNP (last 5 results) Recent Labs    03/08/24 2357 06/10/24 2158 07/13/24 0205  BNP 269.2* 600.6* 801.9*   HbA1C: No results for input(s): HGBA1C in the last 72 hours. CBG: Recent Labs  Lab 08/18/24 1633 08/18/24 2114 08/19/24 0508 08/19/24 0802 08/19/24 1200  GLUCAP 310* 468* 371* 414* 276*   Lipid Profile: No results for input(s): CHOL, HDL, LDLCALC, TRIG, CHOLHDL, LDLDIRECT in the last 72 hours. Thyroid  Function Tests: No results for input(s): TSH, T4TOTAL, FREET4, T3FREE, THYROIDAB in the last 72 hours. Anemia Panel: No results for input(s): VITAMINB12, FOLATE, FERRITIN, TIBC, IRON, RETICCTPCT in the last 72 hours. Sepsis Labs: No results for input(s): PROCALCITON, LATICACIDVEN in the last 168 hours.  Recent Results (from the past 240 hours)  Blood Culture (routine x 2)     Status: None   Collection Time: 08/10/24  9:17 PM   Specimen: BLOOD  Result Value Ref Range Status   Specimen Description BLOOD LEFT ANTECUBITAL  Final   Special Requests   Final    BOTTLES DRAWN AEROBIC AND ANAEROBIC Blood Culture adequate volume   Culture   Final    NO GROWTH 5 DAYS Performed at [facility], [address], [place], [place] [address]    Report Status 08/15/2024 FINAL  Final  Blood Culture (routine x 2)     Status: None  Collection Time: 08/10/24  9:17 PM   Specimen: BLOOD  Result Value Ref Range Status   Specimen Description BLOOD BLOOD RIGHT ARM  Final   Special Requests   Final    BOTTLES DRAWN AEROBIC AND ANAEROBIC Blood Culture results may not be optimal due to an inadequate volume of blood received in culture bottles    Culture   Final    NO GROWTH 5 DAYS Performed at [facility], [address] [place], [place] [address]    Report Status 08/15/2024 FINAL  Final  Aerobic Culture w Gram Stain (superficial specimen)     Status: None   Collection Time: 08/11/24  7:59 AM   Specimen: Wound  Result Value Ref Range Status   Specimen Description   Final    WOUND Performed at [facility], [address] [place], [place] [address]    Special Requests   Final    WD LEFT FOOT Performed at [facility], [address], [place], [place] [address]    Gram Stain   Final    NO WBC SEEN RARE GRAM POSITIVE RODS FEW GRAM POSITIVE COCCI FEW GRAM NEGATIVE RODS Performed at [facility], [address] [place], [place] [address]    Culture   Final    MODERATE STAPHYLOCOCCUS AUREUS MODERATE MORGANELLA MORGANII MODERATE ENTEROCOCCUS FAECALIS    Report Status 08/14/2024 FINAL  Final   Organism ID, Bacteria MORGANELLA MORGANII  Final   Organism ID, Bacteria STAPHYLOCOCCUS AUREUS  Final   Organism ID, Bacteria ENTEROCOCCUS FAECALIS  Final      Susceptibility   Enterococcus faecalis - MIC*    AMPICILLIN  <=2 SENSITIVE Sensitive     VANCOMYCIN  1 SENSITIVE Sensitive     GENTAMICIN  SYNERGY SENSITIVE Sensitive     * MODERATE ENTEROCOCCUS FAECALIS   Morganella morganii - MIC*    AMPICILLIN  >=32 RESISTANT Resistant     ERTAPENEM <=0.12 SENSITIVE Sensitive     CIPROFLOXACIN  >=4 RESISTANT Resistant     GENTAMICIN  >=16 RESISTANT Resistant     MEROPENEM  <=0.25 SENSITIVE Sensitive     TRIMETH /SULFA  >=320 RESISTANT Resistant     AMPICILLIN /SULBACTAM >=32 RESISTANT Resistant     PIP/TAZO Value in next row Sensitive      <=4 SENSITIVEThis is a modified FDA-approved test that has been validated and its performance characteristics determined by the reporting laboratory.  This laboratory is certified under the Clinical Laboratory Improvement Amendments CLIA as qualified to perform high  complexity clinical laboratory testing.    * MODERATE MORGANELLA MORGANII   Staphylococcus aureus - MIC*    CIPROFLOXACIN  Value in next row Resistant      <=4 SENSITIVEThis is a modified FDA-approved test that has been validated and its performance characteristics determined by the reporting laboratory.  This laboratory is certified under the Clinical Laboratory Improvement Amendments CLIA as qualified to perform high complexity clinical laboratory testing.    ERYTHROMYCIN Value in next row Resistant      <=4 SENSITIVEThis is a modified FDA-approved test that has been validated and its performance characteristics determined by the reporting laboratory.  This laboratory is certified under the Clinical Laboratory Improvement Amendments CLIA as qualified to perform high complexity clinical laboratory testing.    GENTAMICIN  Value in next row Sensitive      <=4 SENSITIVEThis is a modified FDA-approved test that has been validated and its performance characteristics determined by the reporting laboratory.  This laboratory is certified under the Clinical Laboratory Improvement Amendments CLIA as  qualified to perform high complexity clinical laboratory testing.    OXACILLIN Value in next row Sensitive      <=4 SENSITIVEThis is a modified FDA-approved test that has been validated and its performance characteristics determined by the reporting laboratory.  This laboratory is certified under the Clinical Laboratory Improvement Amendments CLIA as qualified to perform high complexity clinical laboratory testing.    TETRACYCLINE Value in next row Resistant      <=4 SENSITIVEThis is a modified FDA-approved test that has been validated and its performance characteristics determined by the reporting laboratory.  This laboratory is certified under the Clinical Laboratory Improvement Amendments CLIA as qualified to perform high complexity clinical laboratory testing.    VANCOMYCIN  Value in next row Sensitive      <=4  SENSITIVEThis is a modified FDA-approved test that has been validated and its performance characteristics determined by the reporting laboratory.  This laboratory is certified under the Clinical Laboratory Improvement Amendments CLIA as qualified to perform high complexity clinical laboratory testing.    TRIMETH /SULFA  Value in next row Resistant      <=4 SENSITIVEThis is a modified FDA-approved test that has been validated and its performance characteristics determined by the reporting laboratory.  This laboratory is certified under the Clinical Laboratory Improvement Amendments CLIA as qualified to perform high complexity clinical laboratory testing.    CLINDAMYCIN  Value in next row Resistant      <=4 SENSITIVEThis is a modified FDA-approved test that has been validated and its performance characteristics determined by the reporting laboratory.  This laboratory is certified under the Clinical Laboratory Improvement Amendments CLIA as qualified to perform high complexity clinical laboratory testing.    RIFAMPIN Value in next row Sensitive      <=4 SENSITIVEThis is a modified FDA-approved test that has been validated and its performance characteristics determined by the reporting laboratory.  This laboratory is certified under the Clinical Laboratory Improvement Amendments CLIA as qualified to perform high complexity clinical laboratory testing.    Inducible Clindamycin  Value in next row Sensitive      <=4 SENSITIVEThis is a modified FDA-approved test that has been validated and its performance characteristics determined by the reporting laboratory.  This laboratory is certified under the Clinical Laboratory Improvement Amendments CLIA as qualified to perform high complexity clinical laboratory testing.    LINEZOLID  Value in next row Sensitive      <=4 SENSITIVEThis is a modified FDA-approved test that has been validated and its performance characteristics determined by the reporting laboratory.  This  laboratory is certified under the Clinical Laboratory Improvement Amendments CLIA as qualified to perform high complexity clinical laboratory testing.    * MODERATE STAPHYLOCOCCUS AUREUS  MRSA Next Gen by PCR, Nasal     Status: None   Collection Time: 08/17/24  1:40 PM   Specimen: Nasal Mucosa; Nasal Swab  Result Value Ref Range Status   MRSA by PCR Next Gen NOT DETECTED NOT DETECTED Final    Comment: (NOTE) The GeneXpert MRSA Assay (FDA approved for NASAL specimens only), is one component of a comprehensive MRSA colonization surveillance program. It is not intended to diagnose MRSA infection nor to guide or monitor treatment for MRSA infections. Test performance is not FDA approved in patients less than 4 years old. Performed at [facility], [address] [place], [place] [address]   Aerobic/Anaerobic Culture w Gram Stain (surgical/deep wound)     Status: None (Preliminary result)   Collection Time: 08/18/24  1:05 PM   Specimen: Path  Tissue  Result Value Ref Range Status   Specimen Description   Final    TISSUE LEFT FOOT Performed at [facility], [address] [name], [place] [address]    Special Requests   Final    NONE Performed at [facility], [address] [place], [place] [address]    Gram Stain   Final    NO WBC SEEN FEW GRAM NEGATIVE RODS ABUNDANT GRAM POSITIVE COCCI    Culture   Final    TOO YOUNG TO READ Performed at [facility], [address], [place], [place] [address]    Report Status PENDING  Incomplete     Radiology Studies: DG Foot 2 Views Left Result Date: 08/18/2024 EXAM: 2 VIEW(S) XRAY OF THE LEFT FOOT 08/18/2024 02:02:36 PM COMPARISON: 08/10/2024 CLINICAL HISTORY: Post-operative state FINDINGS: BONES AND JOINTS: Midfoot surgical staples in place. Postoperative changes. Status post transmetatarsal amputation of the first through fifth ray. Plantar and posterior calcaneal spurs. Expected postsurgical changes. SOFT  TISSUES: Soft tissue swelling and subcutaneous emphysema along the plantar surface of the forefoot, consistent with postsurgical changes. IMPRESSION: 1. Status post transmetatarsal amputation of the first through fifth ray with expected postsurgical changes manifested by soft tissue swelling and subcutaneous emphysema along the plantar surface of the forefoot. Electronically signed by: Greig Pique MD MD 08/18/2024 07:34 PM EST RP Workstation: [id number]    Scheduled Meds:  sodium chloride    Intravenous Once   amiodarone   200 mg Oral Daily   apixaban   5 mg Oral BID   vitamin C   500 mg Oral BID   aspirin  EC  81 mg Oral Daily   atorvastatin   10 mg Oral Daily   Chlorhexidine  Gluconate Cloth  6 each Topical Daily   citalopram   20 mg Oral Daily   feeding supplement (NEPRO CARB STEADY)  237 mL Oral TID BM   furosemide   40 mg Oral Daily   guaiFENesin   600 mg Oral BID   insulin  aspart  0-5 Units Subcutaneous QHS   insulin  aspart  0-6 Units Subcutaneous TID WC   insulin  aspart  2 Units Subcutaneous TID WC   insulin  glargine  15 Units Subcutaneous Daily   irbesartan   300 mg Oral Daily   multivitamin with minerals  1 tablet Oral Daily   pantoprazole   40 mg Oral Daily   spironolactone   25 mg Oral Daily   Continuous Infusions:  piperacillin -tazobactam (ZOSYN )  IV 3.375 g (08/19/24 1234)     LOS: 9 days   Norval Bar, MD  [facility]  08/19/2024, 1:15 PM   "

## 2024-08-19 NOTE — Evaluation (Signed)
 Occupational Therapy Re-Evaluation Patient Details Name: [name] MRN: [id number] DOB: April 01, 1956 Today's Date: 08/19/2024   History of Present Illness   69 y.o. male presented by EMS with pain, redness and swelling of the left foot as well as dizziness and weakness, which caused him to fall hitting his head. Per podiatry NWB to LLE in offloading post op shoe. 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 Pt is s/p transmet amputation on 08/18/24.     Clinical Impressions Upon entering the room, pt seated on EOB with increased confusion. He asks therapist,  What did you say about [place]? No one had been discussing [place] prior to entering the room. Pt seated on EOB and maintains NWB in seated position. Pt performs lateral scoots along EOB with min guard for balance. He washes face with set up A while seated. He does have a pair of shorts on bed and therapist offered to assist him and educate him on LB dressing but he declined this session. Pt returning to supine instead and declines OOB activity. He states he recently just returned to bed from toileting needs. NT reported that pt transferred himself to bed from recliner chair and it is unlikely that pt maintained NWB for transfer. Call bell and all needed items within reach.      If plan is discharge home, recommend the following:   A little help with walking and/or transfers;A little help with bathing/dressing/bathroom;Help with stairs or ramp for entrance;Assistance with cooking/housework      Equipment Recommendations   BSC/3in1      Precautions/Restrictions   Precautions Precautions: Fall Recall of Precautions/Restrictions: Impaired Restrictions Weight Bearing Restrictions Per Provider Order: Yes LLE Weight Bearing Per Provider Order: Non weight bearing LLE Partial Weight Bearing  Percentage or Pounds: offloading post op shoe     Mobility Bed Mobility Overal bed mobility: Needs Assistance Bed Mobility: Supine to Sit, Sit to Supine     Supine to sit: Supervision Sit to supine: Supervision        Transfers Overall transfer level: Needs assistance Equipment used: 1 person hand held assist Transfers: Bed to chair/wheelchair/BSC            Lateral/Scoot Transfers: Contact guard assist        Balance Overall balance assessment: Needs assistance Sitting-balance support: Feet supported, No upper extremity supported Sitting balance-Leahy Scale: Normal                                     ADL either performed or assessed with clinical judgement   ADL Overall ADL's : Needs assistance/impaired     Grooming: Wash/dry hands;Sitting;Set up;Supervision/safety                                       Vision Patient Visual Report: No change from baseline              Pertinent Vitals/Pain Pain Assessment Pain Assessment: No/denies pain     Extremity/Trunk Assessment Upper Extremity Assessment Upper Extremity Assessment: Generalized weakness   Lower Extremity Assessment Lower Extremity Assessment: Defer to PT evaluation       Communication Communication Communication: Impaired Factors Affecting Communication: Hearing impaired   Cognition Arousal: Alert  Behavior During Therapy: WFL for tasks assessed/performed Cognition: No family/caregiver present to determine baseline             OT - Cognition Comments: mild confusion with tangential speech                 Following commands: Impaired Following commands impaired: Follows one step commands with increased time     Cueing  General Comments   Cueing Techniques: Verbal cues;Tactile cues;Visual cues          OT Goals(Current goals can be found in the care plan section)   Acute Rehab OT Goals Time For Goal Achievement: 09/02/24    AM-PAC OT 6 Clicks Daily Activity     Outcome Measure Help from another person eating meals?: None Help from another person taking care of personal grooming?: None Help from another person toileting, which includes using toliet, bedpan, or urinal?: A Little Help from another person bathing (including washing, rinsing, drying)?: A Little Help from another person to put on and taking off regular upper body clothing?: None Help from another person to put on and taking off regular lower body clothing?: A Little 6 Click Score: 21   End of Session    Activity Tolerance: Patient tolerated treatment well Patient left: with call bell/phone within reach;in bed;with bed alarm set  OT Visit Diagnosis: Other abnormalities of gait and mobility (R26.89);Muscle weakness (generalized) (M62.81);Pain Pain - Right/Left: Left Pain - part of body: Ankle and joints of foot                Time: 1455-1510 OT Time Calculation (min): 15 min Charges:  OT General Charges $OT Visit: 1 Visit OT Evaluation $OT Re-eval: 1 Re-eval OT Treatments $Therapeutic Activity: 8-22 mins  Izetta Claude, MS, OTR/L , CBIS ascom [phone]  08/19/2024, 4:21 PM

## 2024-08-19 NOTE — Progress Notes (Signed)
 [facility]  Daily Progress Note   Subjective  -   Doing well.  Having appropriate left foot pain after transmetatarsal amputation yesterday.  Foot remains wrapped.  Objective Vitals:   08/19/24 0017 08/19/24 0500 08/19/24 0504 08/19/24 0800  BP:   (!) 150/73 (!) 157/62  Pulse:   73 74  Resp: 18  17 17   Temp:   98.7 F (37.1 C) 98.2 F (36.8 C)  TempSrc:   Oral Oral  SpO2:   98% 95%  Weight:  72.9 kg    Height:        Intake/Output Summary (Last 24 hours) at 08/19/2024 0825 Last data filed at 08/19/2024 0518 Gross per 24 hour  Intake 685.1 ml  Output 1855 ml  Net -1169.9 ml    PULM  CTAB CV  RRR VASC  posterior tibial pulses palpable above the ankle.  Foot is wrapped so difficult to assess capillary refill and perfusion to the foot proper at this time. Incision is C/D/I  Laboratory CBC    Component Value Date/Time   WBC 13.7 (H) 08/19/2024 0500   HGB 11.0 (L) 08/19/2024 0500   HCT 31.7 (L) 08/19/2024 0500   PLT 364 08/19/2024 0500    BMET    Component Value Date/Time   NA 128 (L) 08/19/2024 0500   NA 138 06/16/2019 1144   K 4.2 08/19/2024 0500   CL 92 (L) 08/19/2024 0500   CO2 28 08/19/2024 0500   GLUCOSE 403 (H) 08/19/2024 0500   BUN 18 08/19/2024 0500   BUN 15 06/16/2019 1144   CREATININE 0.98 08/19/2024 0500   CALCIUM  8.8 (L) 08/19/2024 0500   GFRNONAA >60 08/19/2024 0500   GFRAA >60 02/29/2020 0937    Assessment/Planning: POD # 2 s/p left femoral endarterectomy, left SFA thrombectomy and stent placements  Doing well.  Posterior tibial pulses palpable. Foot is wrapped after transmetatarsal amputation yesterday. From my standpoint, can increase activity but would defer weightbearing status to podiatry On aspirin  and statin agent.  Would resume apixaban  today if okay with podiatry.  We can discontinue Plavix .   Selinda Gu  08/19/2024, 8:25 AM

## 2024-08-19 NOTE — Progress Notes (Signed)
 Mobility Specialist - Progress Note   08/19/24 0950  Mobility  Activity Stood at bedside;Pivoted/transferred to/from [facility]  Level of Assistance Contact guard assist, steadying assist  Assistive Device Cane  Distance Ambulated (ft) 3 ft  LLE Weight Bearing Per Provider Order NWB  Activity Response Tolerated well  Mobility visit 1 Mobility  Mobility Specialist Start Time (ACUTE ONLY) 0932  Mobility Specialist Stop Time (ACUTE ONLY) 0950  Mobility Specialist Time Calculation (min) (ACUTE ONLY) 18 min   Pt seated EOB upon entry, utilizing RA. Pt impulsively attempts to exit the bed, refusing physical assistance throughout session opting to do things my way expressed I live alone--- educated on safe mobility practices. Pt required CGA to stand and hop to the [facility]--- NWB LLE. Pt opts to lean on the wall to complete peri care requiring ModA for balance/safety. Pt left seated in the recliner with alarm set and needs within reach.  America Silvan Mobility Specialist 08/19/2024 10:17 AM

## 2024-08-19 NOTE — Plan of Care (Signed)

## 2024-08-20 LAB — GLUCOSE, CAPILLARY
Glucose-Capillary: 331 mg/dL — ABNORMAL HIGH (ref 70–99)
Glucose-Capillary: 313 mg/dL — ABNORMAL HIGH (ref 70–99)
Glucose-Capillary: 216 mg/dL — ABNORMAL HIGH (ref 70–99)

## 2024-08-20 MED ORDER — INSULIN ASPART 100 UNIT/ML IJ SOLN
3.0000 [IU] | Freq: Three times a day (TID) | INTRAMUSCULAR | Status: DC
  Administered 2024-08-20 (×2): 3 [IU] via SUBCUTANEOUS

## 2024-08-20 MED ORDER — INSULIN GLARGINE 100 UNIT/ML ~~LOC~~ SOLN
18.0000 [IU] | Freq: Every day | SUBCUTANEOUS | Status: DC
  Administered 2024-08-20: 18 [IU] via SUBCUTANEOUS
  Filled 2024-08-20 (×2): qty 0.18

## 2024-08-20 MED ORDER — MELATONIN 5 MG PO TABS
2.5000 mg | ORAL_TABLET | Freq: Every day | ORAL | Status: DC
  Administered 2024-08-20 – 2024-08-21 (×2): 2.5 mg via ORAL
  Filled 2024-08-20 (×2): qty 1

## 2024-08-20 NOTE — Progress Notes (Addendum)
 EVS staff Earley Moats) notified nursing patient was on floor. Nursing found patient down on one knee on the fall mat in his room with Foley bag hanging on the bedside commode adjacent to the bed. Skin tear noted to left hand. Patient stated he was trying to get from the bed to the [facility] when he lost his balance. EVS staff observed patient reaching for his walker to transfer from the bed to the [facility] unable to intervene in time to prevent incident. Call light was not sounding. Patient is confused and has been attempting unsafe transfers multiple times this shift with redirection. Bed near nurses station. Frequent safety checks. MD and management aware.  Zabria Liss V Johndaniel Catlin

## 2024-08-20 NOTE — Progress Notes (Signed)
 Glucose 181 at noon. - monitor did not transfer over when CNA docked it.
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
## 2024-08-20 NOTE — Progress Notes (Signed)
"  °  Subjective:  Patient ID: [name], male    DOB: Jan 21, 1956,  MRN: [id number]  Chief Complaint  Patient presents with   Fall    DOS: 08/18/2024 Procedure: Transmetatarsal amputation, left foot  69 y.o. male seen for post op check.  Patient reports pain in his left foot this morning. He denies any pain. Non complaint with Non weightbearing overall. Encourage better complaince  Review of Systems: Negative except as noted in the HPI. Denies N/V/F/Ch.   Objective:   Constitutional Well developed. Well nourished.  Vascular Foot warm and well perfused. Capillary refill normal to all digits.   No calf pain with palpation  Neurologic Normal speech. Oriented to person, place, and time. Epicritic sensation diminished to left foot  Dermatologic Incision intact except for lateral portion superficial eschar noted.  Dependent rubor noted.  No clinical signs of infection noted no purulent drainage noted.  Orthopedic: Status post left foot TMA     Radiographs: 1. Status post transmetatarsal amputation of the first through fifth ray with expected postsurgical changes manifested by soft tissue swelling and subcutaneous emphysema along the plantar surface of the forefoot.    Assessment:   1. Left foot infection   2. 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  POD # 2 s/p left foot transmetatarsal amputation -Progressing as expected postoperatively still working on pain control.  Maintain nonweightbearing PT OT today -Overall hopeful for healing however there was some questionable tissue used for closure laterally due to significant tissue necrosis on the plantar lateral flap -XR: Expected postoperative changes -WB Status: Nonweightbearing in postop shoe left lower extremity -Sutures: Remain intact. -Medications/ABX: Recommend transition to 7 days p.o. antibiotics -Dressing: Will  plan on doing dressing change on Monday with Dr. Malvin - F/u Plan: There appears to be a plantar flap failure to the lateral aspect of the amputation site.  Will continue to clinically monitor.    "

## 2024-08-20 NOTE — Progress Notes (Signed)
 2 Days Post-Op   Subjective/Chief Complaint: Doing OK. Complains of Left foot pain. S/P TMA. Controlled with current regimen.   Objective: Vital signs in last 24 hours: Temp:  [98.1 F (36.7 C)-98.7 F (37.1 C)] 98.7 F (37.1 C) (01/10 0756) Pulse Rate:  [71-74] 73 (01/10 0756) Resp:  [14-18] 14 (01/10 0756) BP: (117-150)/(46-90) 139/46 (01/10 0756) SpO2:  [93 %-99 %] 99 % (01/10 0756) Weight:  [78.4 kg] 78.4 kg (01/10 0500) Last BM Date : 08/19/24  Intake/Output from previous day: 01/09 0701 - 01/10 0700 In: 1093.6 [P.O.:940; IV Piggyback:153.6] Out: 2175 [Urine:2175] Intake/Output this shift: No intake/output data recorded.  General appearance: alert and no distress Cardio: regular rate and rhythm Extremities: LEFT groin incision C/D/I, leg warm, foot with dressing, +PT  Lab Results:  Recent Labs    08/18/24 0344 08/19/24 0500  WBC 11.6* 13.7*  HGB 11.9* 11.0*  HCT 34.6* 31.7*  PLT 382 364   BMET Recent Labs    08/19/24 0500 08/19/24 1443  NA 128* 133*  K 4.2 3.9  CL 92* 94*  CO2 28 32  GLUCOSE 403* 238*  BUN 18 19  CREATININE 0.98 1.05  CALCIUM  8.8* 9.3   PT/INR No results for input(s): LABPROT, INR in the last 72 hours. ABG No results for input(s): PHART, HCO3 in the last 72 hours.  Invalid input(s): PCO2, PO2  Studies/Results: DG Foot 2 Views Left Result Date: 08/18/2024 EXAM: 2 VIEW(S) XRAY OF THE LEFT FOOT 08/18/2024 02:02:36 PM COMPARISON: 08/10/2024 CLINICAL HISTORY: Post-operative state FINDINGS: BONES AND JOINTS: Midfoot surgical staples in place. Postoperative changes. Status post transmetatarsal amputation of the first through fifth ray. Plantar and posterior calcaneal spurs. Expected postsurgical changes. SOFT TISSUES: Soft tissue swelling and subcutaneous emphysema along the plantar surface of the forefoot, consistent with postsurgical changes. IMPRESSION: 1. Status post transmetatarsal amputation of the first through fifth ray  with expected postsurgical changes manifested by soft tissue swelling and subcutaneous emphysema along the plantar surface of the forefoot. Electronically signed by: Greig Pique MD MD 08/18/2024 07:34 PM EST RP Workstation: [id number]    Anti-infectives: Anti-infectives (From admission, onward)    Start     Dose/Rate Route Frequency Ordered Stop   08/17/24 1128  vancomycin  (VANCOCIN ) powder  Status:  Discontinued          As needed 08/17/24 1128 08/17/24 1158   08/17/24 1128  gentamicin  (GARAMYCIN ) injection  Status:  Discontinued          As needed 08/17/24 1129 08/17/24 1158   08/17/24 0700  vancomycin  (VANCOCIN ) IVPB 1000 mg/200 mL premix        1,000 mg 200 mL/hr over 60 Minutes Intravenous 60 min pre-op 08/17/24 0052 08/17/24 0904   08/11/24 1200  vancomycin  (VANCOCIN ) IVPB 1000 mg/200 mL premix  Status:  Discontinued        1,000 mg 200 mL/hr over 60 Minutes Intravenous Every 12 hours 08/11/24 0209 08/11/24 0834   08/11/24 1200  vancomycin  (VANCOREADY) IVPB 750 mg/150 mL  Status:  Discontinued        750 mg 150 mL/hr over 60 Minutes Intravenous Every 12 hours 08/11/24 0834 08/14/24 1159   08/11/24 0400  piperacillin -tazobactam (ZOSYN ) IVPB 3.375 g        3.375 g 12.5 mL/hr over 240 Minutes Intravenous Every 8 hours 08/11/24 0206 08/21/24 2359   08/11/24 0300  vancomycin  (VANCOCIN ) IVPB 1000 mg/200 mL premix  Status:  Discontinued        1,000 mg 200 mL/hr  over 60 Minutes Intravenous Every 12 hours 08/11/24 0208 08/11/24 0209   08/10/24 2230  piperacillin -tazobactam (ZOSYN ) IVPB 3.375 g        3.375 g 100 mL/hr over 30 Minutes Intravenous  Once 08/10/24 2221 08/10/24 2303   08/10/24 2200  vancomycin  (VANCOREADY) IVPB 1500 mg/300 mL        1,500 mg 150 mL/hr over 120 Minutes Intravenous  Once 08/10/24 2110 08/11/24 0335       Assessment/Plan: POD #3 s/p left femoral endarterectomy, left SFA thrombectomy/stent OK from Vascular standpoint for disposition- follow up in 2-3  weeks Continue OOB activity/ weightbearing per Podiatry Continue supportive Medical care ASA/Eliquis /Statin  LOS: 10 days    Tisa Dakin A 08/20/2024

## 2024-08-20 NOTE — Progress Notes (Addendum)
 " PROGRESS NOTE    [name]  FMW:[id number] DOB: [birth year] DOA: 08/10/2024 PCP: Paul Lynwood LELON, MD  Subjective: No acute events overnight. Seen and examined at bedside. Reports pain controlled. Unable to provide history of present illness due to altered mentation. Able to answer simple questions and follow commands. Denies nausea, vomiting, constipation    Hospital Course: 69 y.o. male with medical history significant for PAD, osteomyelitis s/p toe amputation, DM, HTN, bladder cancer, paroxysmal atrial fibrillation on Eliquis , , depression being admitted for cellulitis left foot with possible osteomyelitis.  He was admitted for the same in August 2025, undergoing amputation of the left great toe at that time, and he was treated with meropenem -> Cipro  to complete 4 weeks after OR cultures grew Pseudomonas, Enterobacter and Proteus.  Today he presented by EMS with pain and swelling of the left foot with redness as well as dizziness and weakness, which caused him to fall hitting his head on 1 occasion but without loss of consciousness.  He reports not having electricity in the house and cannot power his cell phone.  He decided to come into the ED to get checked out. In the ED, temp of 99.2 with otherwise normal vitals.  Labs notable for WBC 19,000 but otherwise unremarkable. EKG with no acute concerning findings Venous ultrasound negative for DVT Foot x-ray showed the following: IMPRESSION: 1. Soft tissue swelling along the plantar surface of the forefoot at the amputation site with subcutaneous gas. 2. Mild cortical irregularity along the medial margin of the remaining first metatarsal head, which could indicate osteomyelitis. 3. 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   The ED provider reached out to podiatrist, Dr.  Lamount who recommended Zosyn  and vancomycin  and get MRI.  No mention of OR  MRI of the foot did not show any convincing evidence  of acute osteomyelitis at this time.  1/2.  Patient was contemplating leaving the hospital.  I told him we need to get lab draws if were on heparin  drip. 1/3.  Sugars finally up we will restart Lantus  insulin .  Patient states that he does not have transportation at home or power.  He is interested in returning home.  Morganella resistant to all oral antibiotics. 1/4.  Staph aureus in the wound sensitive to oxacillin, Enterococcus in the wound sensitive to ampicillin .  Zosyn  would cover all of these organisms.  Discontinue vancomycin .  For angiogram tomorrow. 1/5.  Angiogram today.  Case discussed with podiatry and will need at least a transmet amputation but further discussion was may be needed depending on angiogram results. 1/6.  Cardiology planning on cardiac catheterization this afternoon for cardiac clearance for femoral endarterectomy tomorrow.   Assessment and Plan:  Acute encephalopathy Etiology unclear Treatment of acute infection as elsewhere No overt metabolic derangements Discontinue indwelling foley (placed during surgery) and perform voiding trial Start melatonin at bedtime Monitor PO intake, I/Os, Bms, and sleep routine Monitor on fall, delirium and aspiration precautions  Gangrene (HCC) PAD (peripheral artery disease) CAD Tissue on the bottom of the foot and second toe does not look good.  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   Appreciate podiatry and vascular surgery consultations.  MRI does not show any signs of osteomyelitis but tissue on the bottom of the foot does not look good.  Wound culture growing moderate Staph aureus, Morganella and Enterococcus.   Morganella does show resistance and we do not have an oral choice.   - Patient started on aggressive antibiotics initially with vancomycin  and Zosyn .  - Vancomycin  discontinued on 1/4  - cont Zosyn . Initial plan to continue for 7 days post TMA. Discussed with podiatry 1/9 who recommended he can finish the course on 1/11 as the surgery likely removed most of not all all infected tissue.  - wound care as per podiatry recs - will need to follow up with podiatry outpatient   Uncontrolled type 2 diabetes mellitus with hypoglycemia, with long-term current use of insulin  (HCC) - increase lantus  15 to 18 units daily - increase insulin  aspart 2 to 3 units TID - monitor on SSI with FS ACHS - cont diabetic diet   PAF (paroxysmal atrial fibrillation) (HCC) Continue amiodarone  Cont eliquis    Essential hypertension Continue irbesartan , spironolactone  and Lasix    Bladder cancer (HCC) Follow with oncology outpatient   Housing instability Plan to discharge to relative's home after IV antibiotic course finished TOC following   Tobacco abuse Nicotine patch.  Patient must stop smoking.  DVT prophylaxis:  apixaban  (ELIQUIS ) tablet 5 mg  Eliquis    Code Status: Full Code  Disposition Plan: Relative's house Reason for continuing need for hospitalization: IV antibiotics, monitor for mental status changes  Objective: Vitals:   08/19/24 2233 08/20/24 0500 08/20/24 0507 08/20/24 0756  BP: (!) 150/54  (!) 139/90 (!) 139/46  Pulse: 74  71 73  Resp: 16  18 14   Temp: 98.6 F (37 C)  98.4 F (36.9 C) 98.7 F (37.1 C)  TempSrc:    Oral  SpO2: 99%  93% 99%  Weight:  78.4 kg    Height:        Intake/Output Summary  (Last 24 hours) at 08/20/2024 1225 Last data filed at 08/20/2024 1027 Gross per 24 hour  Intake 1093.64 ml  Output 2175 ml  Net -1081.36 ml   Filed Weights   08/18/24 1214 08/19/24 0500 08/20/24 0500  Weight: 69.6 kg 72.9 kg 78.4 kg    Examination:  Physical Exam Vitals and nursing note reviewed.  Constitutional:      General: He is not in acute distress.    Appearance: He is ill-appearing.  HENT:     Head: Normocephalic and atraumatic.  Cardiovascular:     Rate and Rhythm: Normal rate and regular rhythm.     Pulses: Normal pulses.     Heart sounds: Normal heart sounds.  Pulmonary:     Effort: Pulmonary effort is normal.     Breath sounds: Normal breath sounds.  Abdominal:     General: Bowel sounds are normal.     Palpations: Abdomen is soft.  Neurological:     Mental Status: He is alert. He is disoriented.     Data Reviewed: I have personally reviewed following labs and imaging studies  CBC: Recent Labs  Lab 08/15/24 0534 08/16/24 0436 08/16/24 1455 08/16/24 1457 08/17/24 0221  08/18/24 0344 08/19/24 0500  WBC 10.7* 10.7*  --   --  11.4* 11.6* 13.7*  HGB 12.4* 12.6* 12.2* 11.9* 13.0 11.9* 11.0*  HCT 36.2* 36.2* 36.0* 35.0* 38.0* 34.6* 31.7*  MCV 93.8 92.8  --   --  93.4 93.8 92.4  PLT 330 386  --   --  401* 382 364   Basic Metabolic Panel: Recent Labs  Lab 08/15/24 0534 08/16/24 1455 08/16/24 1457 08/16/24 1843 08/17/24 1612 08/18/24 0938 08/18/24 2235 08/19/24 0500 08/19/24 1443  NA  --    < > 134* 137 138  --   --  128* 133*  K  --    < > 3.5 3.8 5.0 4.0  --  4.2 3.9  CL  --   --   --  95* 99  --   --  92* 94*  CO2  --   --   --  32 25  --   --  28 32  GLUCOSE  --   --   --  190* 230*  --  485* 403* 238*  BUN  --   --   --  17 19  --   --  18 19  CREATININE 0.96  --   --  1.08 1.02  --   --  0.98 1.05  CALCIUM   --   --   --  9.1 8.9  --   --  8.8* 9.3  MG  --   --   --   --   --   --   --  1.4*  --   PHOS  --   --   --   --   --   --   --   2.8  --    < > = values in this interval not displayed.   GFR: Estimated Creatinine Clearance: 72.7 mL/min (by C-G formula based on SCr of 1.05 mg/dL). Liver Function Tests: No results for input(s): AST, ALT, ALKPHOS, BILITOT, PROT, ALBUMIN  in the last 168 hours. No results for input(s): LIPASE, AMYLASE in the last 168 hours. No results for input(s): AMMONIA in the last 168 hours. Coagulation Profile: No results for input(s): INR, PROTIME in the last 168 hours. Cardiac Enzymes: No results for input(s): CKTOTAL, CKMB, CKMBINDEX, TROPONINI in the last 168 hours. ProBNP, BNP (last 5 results) Recent Labs    03/08/24 2357 06/10/24 2158 07/13/24 0205  BNP 269.2* 600.6* 801.9*   HbA1C: No results for input(s): HGBA1C in the last 72 hours. CBG: Recent Labs  Lab 08/19/24 0802 08/19/24 1200 08/19/24 1711 08/19/24 2236 08/20/24 0753  GLUCAP 414* 276* 266* 223* 331*   Lipid Profile: No results for input(s): CHOL, HDL, LDLCALC, TRIG, CHOLHDL, LDLDIRECT in the last 72 hours. Thyroid  Function Tests: No results for input(s): TSH, T4TOTAL, FREET4, T3FREE, THYROIDAB in the last 72 hours. Anemia Panel: No results for input(s): VITAMINB12, FOLATE, FERRITIN, TIBC, IRON, RETICCTPCT in the last 72 hours. Sepsis Labs: No results for input(s): PROCALCITON, LATICACIDVEN in the last 168 hours.  Recent Results (from the past 240 hours)  Blood Culture (routine x 2)     Status: None   Collection Time: 08/10/24  9:17 PM   Specimen: BLOOD  Result Value Ref Range Status   Specimen Description BLOOD LEFT ANTECUBITAL  Final   Special Requests   Final    BOTTLES DRAWN AEROBIC AND ANAEROBIC Blood Culture adequate volume   Culture   Final    NO GROWTH 5 DAYS Performed  at [facility], [address] [place], [place] [address]    Report Status 08/15/2024 FINAL  Final  Blood Culture (routine x 2)     Status: None    Collection Time: 08/10/24  9:17 PM   Specimen: BLOOD  Result Value Ref Range Status   Specimen Description BLOOD BLOOD RIGHT ARM  Final   Special Requests   Final    BOTTLES DRAWN AEROBIC AND ANAEROBIC Blood Culture results may not be optimal due to an inadequate volume of blood received in culture bottles   Culture   Final    NO GROWTH 5 DAYS Performed at [facility], [address] [place], [place] [address]    Report Status 08/15/2024 FINAL  Final  Aerobic Culture w Gram Stain (superficial specimen)     Status: None   Collection Time: 08/11/24  7:59 AM   Specimen: Wound  Result Value Ref Range Status   Specimen Description   Final    WOUND Performed at [facility], [address] [place], [place] [address]    Special Requests   Final    WD LEFT FOOT Performed at [facility], [address] [place], [place] [address]    Gram Stain   Final    NO WBC SEEN RARE GRAM POSITIVE RODS FEW GRAM POSITIVE COCCI FEW GRAM NEGATIVE RODS Performed at [facility], [address], [place], [place] [address]    Culture   Final    MODERATE STAPHYLOCOCCUS AUREUS MODERATE MORGANELLA MORGANII MODERATE ENTEROCOCCUS FAECALIS    Report Status 08/14/2024 FINAL  Final   Organism ID, Bacteria MORGANELLA MORGANII  Final   Organism ID, Bacteria STAPHYLOCOCCUS AUREUS  Final   Organism ID, Bacteria ENTEROCOCCUS FAECALIS  Final      Susceptibility   Enterococcus faecalis - MIC*    AMPICILLIN  <=2 SENSITIVE Sensitive     VANCOMYCIN  1 SENSITIVE Sensitive     GENTAMICIN  SYNERGY SENSITIVE Sensitive     * MODERATE ENTEROCOCCUS FAECALIS   Morganella morganii - MIC*    AMPICILLIN  >=32 RESISTANT Resistant     ERTAPENEM <=0.12 SENSITIVE Sensitive     CIPROFLOXACIN  >=4 RESISTANT Resistant     GENTAMICIN  >=16 RESISTANT Resistant     MEROPENEM  <=0.25 SENSITIVE Sensitive     TRIMETH /SULFA  >=320 RESISTANT Resistant     AMPICILLIN /SULBACTAM >=32 RESISTANT Resistant      PIP/TAZO Value in next row Sensitive      <=4 SENSITIVEThis is a modified FDA-approved test that has been validated and its performance characteristics determined by the reporting laboratory.  This laboratory is certified under the Clinical Laboratory Improvement Amendments CLIA as qualified to perform high complexity clinical laboratory testing.    * MODERATE MORGANELLA MORGANII   Staphylococcus aureus - MIC*    CIPROFLOXACIN  Value in next row Resistant      <=4 SENSITIVEThis is a modified FDA-approved test that has been validated and its performance characteristics determined by the reporting laboratory.  This laboratory is certified under the Clinical Laboratory Improvement Amendments CLIA as qualified to perform high complexity clinical laboratory testing.    ERYTHROMYCIN Value in next row Resistant      <=4 SENSITIVEThis is a modified FDA-approved test that has been validated and its performance characteristics determined by the reporting laboratory.  This laboratory is certified under the Clinical Laboratory Improvement Amendments CLIA as qualified to perform high complexity clinical laboratory testing.    GENTAMICIN  Value in next row Sensitive      <=  4 SENSITIVEThis is a modified FDA-approved test that has been validated and its performance characteristics determined by the reporting laboratory.  This laboratory is certified under the Clinical Laboratory Improvement Amendments CLIA as qualified to perform high complexity clinical laboratory testing.    OXACILLIN Value in next row Sensitive      <=4 SENSITIVEThis is a modified FDA-approved test that has been validated and its performance characteristics determined by the reporting laboratory.  This laboratory is certified under the Clinical Laboratory Improvement Amendments CLIA as qualified to perform high complexity clinical laboratory testing.    TETRACYCLINE Value in next row Resistant      <=4 SENSITIVEThis is a modified FDA-approved test  that has been validated and its performance characteristics determined by the reporting laboratory.  This laboratory is certified under the Clinical Laboratory Improvement Amendments CLIA as qualified to perform high complexity clinical laboratory testing.    VANCOMYCIN  Value in next row Sensitive      <=4 SENSITIVEThis is a modified FDA-approved test that has been validated and its performance characteristics determined by the reporting laboratory.  This laboratory is certified under the Clinical Laboratory Improvement Amendments CLIA as qualified to perform high complexity clinical laboratory testing.    TRIMETH /SULFA  Value in next row Resistant      <=4 SENSITIVEThis is a modified FDA-approved test that has been validated and its performance characteristics determined by the reporting laboratory.  This laboratory is certified under the Clinical Laboratory Improvement Amendments CLIA as qualified to perform high complexity clinical laboratory testing.    CLINDAMYCIN  Value in next row Resistant      <=4 SENSITIVEThis is a modified FDA-approved test that has been validated and its performance characteristics determined by the reporting laboratory.  This laboratory is certified under the Clinical Laboratory Improvement Amendments CLIA as qualified to perform high complexity clinical laboratory testing.    RIFAMPIN Value in next row Sensitive      <=4 SENSITIVEThis is a modified FDA-approved test that has been validated and its performance characteristics determined by the reporting laboratory.  This laboratory is certified under the Clinical Laboratory Improvement Amendments CLIA as qualified to perform high complexity clinical laboratory testing.    Inducible Clindamycin  Value in next row Sensitive      <=4 SENSITIVEThis is a modified FDA-approved test that has been validated and its performance characteristics determined by the reporting laboratory.  This laboratory is certified under the Clinical  Laboratory Improvement Amendments CLIA as qualified to perform high complexity clinical laboratory testing.    LINEZOLID  Value in next row Sensitive      <=4 SENSITIVEThis is a modified FDA-approved test that has been validated and its performance characteristics determined by the reporting laboratory.  This laboratory is certified under the Clinical Laboratory Improvement Amendments CLIA as qualified to perform high complexity clinical laboratory testing.    * MODERATE STAPHYLOCOCCUS AUREUS  MRSA Next Gen by PCR, Nasal     Status: None   Collection Time: 08/17/24  1:40 PM   Specimen: Nasal Mucosa; Nasal Swab  Result Value Ref Range Status   MRSA by PCR Next Gen NOT DETECTED NOT DETECTED Final    Comment: (NOTE) The GeneXpert MRSA Assay (FDA approved for NASAL specimens only), is one component of a comprehensive MRSA colonization surveillance program. It is not intended to diagnose MRSA infection nor to guide or monitor treatment for MRSA infections. Test performance is not FDA approved in patients less than 41 years old. Performed at [facility], [address] [place], [place] [address]   Aerobic/Anaerobic Culture w Gram Stain (surgical/deep wound)     Status: None (Preliminary result)   Collection Time: 08/18/24  1:05 PM   Specimen: Path Tissue  Result Value Ref Range Status   Specimen Description   Final    TISSUE LEFT FOOT Performed at [facility], [address], [place], [place] [address]    Special Requests   Final    NONE Performed at [facility], [address] [place], [place] [address]    Gram Stain   Final    NO WBC SEEN FEW GRAM NEGATIVE RODS ABUNDANT GRAM POSITIVE COCCI    Culture   Final    CULTURE REINCUBATED FOR BETTER GROWTH Performed at [facility], [address] [place], [place] [address]    Report Status PENDING  Incomplete     Radiology Studies: DG Foot 2 Views Left Result Date: 08/18/2024 EXAM: 2 VIEW(S) XRAY  OF THE LEFT FOOT 08/18/2024 02:02:36 PM COMPARISON: 08/10/2024 CLINICAL HISTORY: Post-operative state FINDINGS: BONES AND JOINTS: Midfoot surgical staples in place. Postoperative changes. Status post transmetatarsal amputation of the first through fifth ray. Plantar and posterior calcaneal spurs. Expected postsurgical changes. SOFT TISSUES: Soft tissue swelling and subcutaneous emphysema along the plantar surface of the forefoot, consistent with postsurgical changes. IMPRESSION: 1. Status post transmetatarsal amputation of the first through fifth ray with expected postsurgical changes manifested by soft tissue swelling and subcutaneous emphysema along the plantar surface of the forefoot. Electronically signed by: Greig Pique MD MD 08/18/2024 07:34 PM EST RP Workstation: [id number]    Scheduled Meds:  sodium chloride    Intravenous Once   amiodarone   200 mg Oral Daily   apixaban   5 mg Oral BID   vitamin C   500 mg Oral BID   aspirin  EC  81 mg Oral Daily   atorvastatin   10 mg Oral Daily   Chlorhexidine  Gluconate Cloth  6 each Topical Daily   citalopram   20 mg Oral Daily   feeding supplement (NEPRO CARB STEADY)  237 mL Oral TID BM   furosemide   40 mg Oral Daily   guaiFENesin   600 mg Oral BID   insulin  aspart  0-5 Units Subcutaneous QHS   insulin  aspart  0-6 Units Subcutaneous TID WC   insulin  aspart  3 Units Subcutaneous TID WC   insulin  glargine  18 Units Subcutaneous Daily   irbesartan   300 mg Oral Daily   multivitamin with minerals  1 tablet Oral Daily   pantoprazole   40 mg Oral Daily   spironolactone   25 mg Oral Daily   Continuous Infusions:  piperacillin -tazobactam (ZOSYN )  IV 3.375 g (08/20/24 1143)     LOS: 10 days   Norval Bar, MD  [facility]  08/20/2024, 12:25 PM   "

## 2024-08-20 NOTE — Plan of Care (Signed)

## 2024-08-21 LAB — BASIC METABOLIC PANEL WITH GFR
Anion gap: 8 (ref 5–15)
BUN: 21 mg/dL (ref 8–23)
CO2: 30 mmol/L (ref 22–32)
Calcium: 9.3 mg/dL (ref 8.9–10.3)
Chloride: 95 mmol/L — ABNORMAL LOW (ref 98–111)
Creatinine, Ser: 0.98 mg/dL (ref 0.61–1.24)
GFR, Estimated: >60 mL/min
Glucose, Bld: 335 mg/dL — ABNORMAL HIGH (ref 70–99)
Potassium: 3.8 mmol/L (ref 3.5–5.1)
Sodium: 133 mmol/L — ABNORMAL LOW (ref 135–145)

## 2024-08-21 LAB — GLUCOSE, CAPILLARY
Glucose-Capillary: 305 mg/dL — ABNORMAL HIGH (ref 70–99)
Glucose-Capillary: 310 mg/dL — ABNORMAL HIGH (ref 70–99)
Glucose-Capillary: 297 mg/dL — ABNORMAL HIGH (ref 70–99)
Glucose-Capillary: 316 mg/dL — ABNORMAL HIGH (ref 70–99)
Glucose-Capillary: 319 mg/dL — ABNORMAL HIGH (ref 70–99)

## 2024-08-21 MED ORDER — INSULIN ASPART 100 UNIT/ML IJ SOLN
4.0000 [IU] | Freq: Three times a day (TID) | INTRAMUSCULAR | Status: DC
  Administered 2024-08-21 – 2024-08-22 (×4): 4 [IU] via SUBCUTANEOUS
  Filled 2024-08-21 (×3): qty 4

## 2024-08-21 MED ORDER — INSULIN GLARGINE 100 UNIT/ML ~~LOC~~ SOLN
20.0000 [IU] | Freq: Every day | SUBCUTANEOUS | Status: DC
  Filled 2024-08-21: qty 0.2

## 2024-08-21 MED ORDER — INSULIN GLARGINE 100 UNIT/ML ~~LOC~~ SOLN
22.0000 [IU] | Freq: Every day | SUBCUTANEOUS | Status: DC
  Administered 2024-08-21: 22 [IU] via SUBCUTANEOUS
  Filled 2024-08-21 (×2): qty 0.22

## 2024-08-21 NOTE — Plan of Care (Signed)
 " Problem: Education: Goal: Knowledge of General Education information will improve Description: Including pain rating scale, medication(s)/side effects and non-pharmacologic comfort measures Outcome: Progressing   Problem: Health Behavior/Discharge Planning: Goal: Ability to manage health-related needs will improve Outcome: Progressing   Problem: Clinical Measurements: Goal: Ability to maintain clinical measurements within normal limits will improve Outcome: Progressing Goal: Will remain free from infection Outcome: Progressing Goal: Diagnostic test results will improve Outcome: Progressing Goal: Respiratory complications will improve Outcome: Progressing Goal: Cardiovascular complication will be avoided Outcome: Progressing   Problem: Activity: Goal: Risk for activity intolerance will decrease Outcome: Progressing   Problem: Nutrition: Goal: Adequate nutrition will be maintained Outcome: Progressing   Problem: Coping: Goal: Level of anxiety will decrease Outcome: Progressing   Problem: Elimination: Goal: Will not experience complications related to bowel motility Outcome: Progressing Goal: Will not experience complications related to urinary retention Outcome: Progressing   Problem: Pain Managment: Goal: General experience of comfort will improve and/or be controlled Outcome: Progressing   Problem: Safety: Goal: Ability to remain free from injury will improve Outcome: Progressing   Problem: Skin Integrity: Goal: Risk for impaired skin integrity will decrease Outcome: Progressing   Problem: Clinical Measurements: Goal: Ability to avoid or minimize complications of infection will improve Outcome: Progressing   Problem: Skin Integrity: Goal: Skin integrity will improve Outcome: Progressing   Problem: Education: Goal: Ability to describe self-care measures that may prevent or decrease complications (Diabetes Survival Skills Education) will improve Outcome:  Progressing Goal: Individualized Educational Video(s) Outcome: Progressing   Problem: Coping: Goal: Ability to adjust to condition or change in health will improve Outcome: Progressing   Problem: Fluid Volume: Goal: Ability to maintain a balanced intake and output will improve Outcome: Progressing   Problem: Health Behavior/Discharge Planning: Goal: Ability to identify and utilize available resources and services will improve Outcome: Progressing Goal: Ability to manage health-related needs will improve Outcome: Progressing   Problem: Metabolic: Goal: Ability to maintain appropriate glucose levels will improve Outcome: Progressing   Problem: Nutritional: Goal: Maintenance of adequate nutrition will improve Outcome: Progressing Goal: Progress toward achieving an optimal weight will improve Outcome: Progressing   Problem: Skin Integrity: Goal: Risk for impaired skin integrity will decrease Outcome: Progressing   Problem: Tissue Perfusion: Goal: Adequacy of tissue perfusion will improve Outcome: Progressing   Problem: Education: Goal: Understanding of CV disease, CV risk reduction, and recovery process will improve Outcome: Progressing Goal: Individualized Educational Video(s) Outcome: Progressing   Problem: Activity: Goal: Ability to return to baseline activity level will improve Outcome: Progressing   Problem: Cardiovascular: Goal: Ability to achieve and maintain adequate cardiovascular perfusion will improve Outcome: Progressing Goal: Vascular access site(s) Level 0-1 will be maintained Outcome: Progressing   Problem: Health Behavior/Discharge Planning: Goal: Ability to safely manage health-related needs after discharge will improve Outcome: Progressing   Problem: Education: Goal: Knowledge of the prescribed therapeutic regimen will improve Outcome: Progressing   Problem: Bowel/Gastric: Goal: Gastrointestinal status for postoperative course will  improve Outcome: Progressing   Problem: Cardiac: Goal: Ability to maintain an adequate cardiac output Outcome: Progressing Goal: Will show no evidence of cardiac arrhythmias Outcome: Progressing   Problem: Nutritional: Goal: Will attain and maintain optimal nutritional status Outcome: Progressing   Problem: Neurological: Goal: Will regain or maintain usual level of consciousness Outcome: Progressing   Problem: Clinical Measurements: Goal: Ability to maintain clinical measurements within normal limits Outcome: Progressing Goal: Postoperative complications will be avoided or minimized Outcome: Progressing   Problem: Respiratory: Goal: Will regain and/or maintain  adequate ventilation Outcome: Progressing Goal: Respiratory status will improve Outcome: Progressing   "

## 2024-08-21 NOTE — TOC Progression Note (Signed)
 Transition of Care [facility]) - Progression Note    Patient Details  Name: [name] MRN: [id number] Date of Birth: 01-05-1956  Transition of Care [facility]) CM/SW Contact  Victory Jackquline RAMAN, RN Phone Number: 08/21/2024, 2:31 PM  Clinical Narrative:    Per MD, patient is not medically stable for discharge. Podiatry may want to extend his antibiotic regimen but don't know for how much longer. RNCM will continue to follow for discharge planning needs.                       Expected Discharge Plan and Services                                               Social Drivers of Health (SDOH) Interventions SDOH Screenings   Food Insecurity: Food Insecurity Present (08/11/2024)  Housing: High Risk (08/11/2024)  Transportation Needs: Unmet Transportation Needs (08/11/2024)  Utilities: At Risk (08/15/2024)  Alcohol Screen: Low Risk (06/10/2023)  Depression (PHQ2-9): Low Risk (07/05/2024)  Financial Resource Strain: High Risk (03/11/2024)  Physical Activity: Inactive (06/10/2023)  Social Connections: Socially Isolated (08/11/2024)  Stress: Stress Concern Present (03/11/2024)  Tobacco Use: High Risk (08/18/2024)  Health Literacy: Inadequate Health Literacy (02/17/2024)    Readmission Risk Interventions    03/30/2024    2:55 PM 03/09/2024    3:21 PM 12/30/2023    1:17 PM  Readmission Risk Prevention Plan  Post Dischage Appt   Complete  Medication Screening   Complete  Transportation Screening Complete Complete Complete  PCP or Specialist Appt within 5-7 Days  Complete   PCP or Specialist Appt within 3-5 Days Complete    Home Care Screening  Complete   Medication Review (RN CM)  Complete   HRI or Home Care Consult Complete    Social Work Consult for Recovery Care Planning/Counseling Complete    Palliative Care Screening Complete    Medication Review Oceanographer) Referral to Pharmacy

## 2024-08-21 NOTE — Progress Notes (Addendum)
 " PROGRESS NOTE    [name]  FMW:[id number] DOB: 05-Feb-1956 DOA: 08/10/2024 PCP: Norleen Lynwood LELON, MD  Subjective: No new subjective & objective note has been filed under this hospital service since the last note was generated.    Hospital Course: 69 y.o. male with medical history significant for PAD, osteomyelitis s/p toe amputation, DM, HTN, bladder cancer, paroxysmal atrial fibrillation on Eliquis , , depression being admitted for cellulitis left foot with possible osteomyelitis.  He was admitted for the same in August 2025, undergoing amputation of the left great toe at that time, and he was treated with meropenem -> Cipro  to complete 4 weeks after OR cultures grew Pseudomonas, Enterobacter and Proteus.  Today he presented by EMS with pain and swelling of the left foot with redness as well as dizziness and weakness, which caused him to fall hitting his head on 1 occasion but without loss of consciousness.  He reports not having electricity in the house and cannot power his cell phone.  He decided to come into the ED to get checked out. In the ED, temp of 99.2 with otherwise normal vitals.  Labs notable for WBC 19,000 but otherwise unremarkable. EKG with no acute concerning findings Venous ultrasound negative for DVT Foot x-ray showed the following: IMPRESSION: 1. Soft tissue swelling along the plantar surface of the forefoot at the amputation site with subcutaneous gas. 2. Mild cortical irregularity along the medial margin of the remaining first metatarsal head, which could indicate osteomyelitis. 3. 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   The ED provider reached out to podiatrist, Dr.  Lamount who recommended Zosyn  and vancomycin  and get MRI.  No mention of OR  MRI of the foot did not show any convincing evidence of acute osteomyelitis at this time.  1/2.  Patient was contemplating leaving the hospital.  I told him we need to get lab draws  if were on heparin  drip. 1/3.  Sugars finally up we will restart Lantus  insulin .  Patient states that he does not have transportation at home or power.  He is interested in returning home.  Morganella resistant to all oral antibiotics. 1/4.  Staph aureus in the wound sensitive to oxacillin, Enterococcus in the wound sensitive to ampicillin .  Zosyn  would cover all of these organisms.  Discontinue vancomycin .  For angiogram tomorrow. 1/5.  Angiogram today.  Case discussed with podiatry and will need at least a transmet amputation but further discussion was may be needed depending on angiogram results. 1/6.  Cardiology planning on cardiac catheterization this afternoon for cardiac clearance for femoral endarterectomy tomorrow.   Assessment and Plan:  Acute encephalopathy Waxing and waning course  Treatment of acute infection as elsewhere No overt metabolic derangements Discontinued indwelling foley (placed during surgery) and passed voiding trial Cont melatonin at bedtime Monitor PO intake, I/Os, Bms, and sleep routine Monitor on fall, delirium and aspiration precautions   Gangrene (HCC) PAD (peripheral artery disease) CAD Tissue on the bottom of the foot and second toe does not look good.  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   Appreciate podiatry and vascular surgery consultations.  MRI does  not show any signs of osteomyelitis but tissue on the bottom of the foot does not look good.  Wound culture growing moderate Staph aureus, Morganella and Enterococcus.   Morganella does show resistance and we do not have an oral choice.   - Patient started on aggressive antibiotics initially with vancomycin  and Zosyn .  - Vancomycin  discontinued on 1/4  - cont Zosyn . Initial plan to continue for 7 days post TMA. Given concerns for flap failure, unsure he needs longer therapy. - spoke with podiatry who will re-evaluate for duration of IV antibiotics - wound care as per podiatry recs - will need to follow up with podiatry outpatient   Uncontrolled type 2 diabetes mellitus with hypoglycemia, with long-term current use of insulin  (HCC) - increase lantus  18 to 22 units daily - increase insulin  aspart 3 to 4 units TID - monitor on SSI with FS ACHS - cont diabetic diet   PAF (paroxysmal atrial fibrillation) (HCC) Continue amiodarone  Cont eliquis    Essential hypertension Continue irbesartan , spironolactone  and Lasix    Bladder cancer (HCC) Follow with oncology outpatient   Housing instability Plan to discharge to relative's home after IV antibiotic course finished TOC following   Tobacco abuse Nicotine patch.  Patient must stop smoking.  DVT prophylaxis:  apixaban  (ELIQUIS ) tablet 5 mg  Eliquis    Code Status: Full Code  Disposition Plan: TBD Reason for continuing need for hospitalization: podiatry recommendations, IV antibiotics, PT recommendations  Objective: Vitals:   08/20/24 1935 08/21/24 0350 08/21/24 0427 08/21/24 0749  BP: (!) 155/73 (!) 158/72  (!) 152/61  Pulse: 69 66  72  Resp: 19 18  16   Temp: 98.3 F (36.8 C)   98.6 F (37 C)  TempSrc: Oral   Oral  SpO2: 100% 93%  97%  Weight:   70.1 kg   Height:        Intake/Output Summary (Last 24 hours) at 08/21/2024 1208 Last data filed at 08/21/2024 1001 Gross per 24 hour  Intake 1080 ml  Output 500 ml  Net 580 ml    Filed Weights   08/19/24 0500 08/20/24 0500 08/21/24 0427  Weight: 72.9 kg 78.4 kg 70.1 kg    Examination:  Physical Exam Vitals and nursing note reviewed.  Constitutional:      General: He is not in acute distress.    Appearance: He is ill-appearing.     Comments: Weak, frail  HENT:     Head: Normocephalic and atraumatic.  Cardiovascular:     Rate and Rhythm: Normal rate and regular rhythm.     Pulses: Normal pulses.     Heart sounds: Normal heart sounds.  Pulmonary:     Effort: Pulmonary effort is normal.     Breath sounds: Normal breath sounds.  Abdominal:     General: Bowel sounds are normal.     Palpations: Abdomen is soft.  Neurological:     Mental Status: He is alert. Mental status is at baseline.     Data Reviewed: I have personally reviewed following labs and imaging studies  CBC: Recent Labs  Lab 08/15/24 0534 08/16/24 0436 08/16/24 1455 08/16/24 1457 08/17/24 0221 08/18/24 0344 08/19/24 0500  WBC 10.7* 10.7*  --   --  11.4* 11.6* 13.7*  HGB 12.4* 12.6* 12.2* 11.9* 13.0 11.9* 11.0*  HCT 36.2* 36.2* 36.0* 35.0* 38.0* 34.6* 31.7*  MCV 93.8 92.8  --   --  93.4 93.8 92.4  PLT 330 386  --   --  401* 382 364   Basic Metabolic Panel: Recent Labs  Lab 08/16/24 1843 08/17/24 1612 08/18/24 0938 08/18/24 2235 08/19/24 0500 08/19/24 1443 08/21/24 0626  NA 137 138  --   --  128* 133* 133*  K 3.8 5.0 4.0  --  4.2 3.9 3.8  CL 95* 99  --   --  92* 94* 95*  CO2 32 25  --   --  28 32 30  GLUCOSE 190* 230*  --  485* 403* 238* 335*  BUN 17 19  --   --  18 19 21   CREATININE 1.08 1.02  --   --  0.98 1.05 0.98  CALCIUM  9.1 8.9  --   --  8.8* 9.3 9.3  MG  --   --   --   --  1.4*  --   --   PHOS  --   --   --   --  2.8  --   --    GFR: Estimated Creatinine Clearance: 72.5 mL/min (by C-G formula based on SCr of 0.98 mg/dL). Liver Function Tests: No results for input(s): AST, ALT, ALKPHOS, BILITOT, PROT, ALBUMIN  in the last 168 hours. No results  for input(s): LIPASE, AMYLASE in the last 168 hours. No results for input(s): AMMONIA in the last 168 hours. Coagulation Profile: No results for input(s): INR, PROTIME in the last 168 hours. Cardiac Enzymes: No results for input(s): CKTOTAL, CKMB, CKMBINDEX, TROPONINI in the last 168 hours. ProBNP, BNP (last 5 results) Recent Labs    03/08/24 2357 06/10/24 2158 07/13/24 0205  BNP 269.2* 600.6* 801.9*   HbA1C: No results for input(s): HGBA1C in the last 72 hours. CBG: Recent Labs  Lab 08/19/24 2236 08/20/24 0753 08/20/24 1656 08/20/24 2038 08/21/24 0743  GLUCAP 223* 331* 313* 216* 305*   Lipid Profile: No results for input(s): CHOL, HDL, LDLCALC, TRIG, CHOLHDL, LDLDIRECT in the last 72 hours. Thyroid  Function Tests: No results for input(s): TSH, T4TOTAL, FREET4, T3FREE, THYROIDAB in the last 72 hours. Anemia Panel: No results for input(s): VITAMINB12, FOLATE, FERRITIN, TIBC, IRON, RETICCTPCT in the last 72 hours. Sepsis Labs: No results for input(s): PROCALCITON, LATICACIDVEN in the last 168 hours.  Recent Results (from the past 240 hours)  MRSA Next Gen by PCR, Nasal     Status: None   Collection Time: 08/17/24  1:40 PM   Specimen: Nasal Mucosa; Nasal Swab  Result Value Ref Range Status   MRSA by PCR Next Gen NOT DETECTED NOT DETECTED Final    Comment: (NOTE) The GeneXpert MRSA Assay (FDA approved for NASAL specimens only), is one component of a comprehensive MRSA colonization surveillance program. It is not intended to diagnose MRSA infection nor to guide or monitor treatment for MRSA infections. Test performance is not FDA approved in patients less than 74 years old. Performed at [facility], [address] [place], [place] [address]   Aerobic/Anaerobic Culture w Gram Stain (surgical/deep wound)     Status: None (Preliminary result)   Collection Time: 08/18/24  1:05 PM   Specimen: Path Tissue   Result Value Ref Range Status   Specimen Description   Final    TISSUE LEFT FOOT Performed at [facility], [address],  [place], [place] [address]    Special Requests   Final    NONE Performed at [facility], [address] [place], [place] [address]    Gram Stain   Final    NO WBC SEEN FEW GRAM NEGATIVE RODS ABUNDANT GRAM POSITIVE COCCI    Culture   Final    CULTURE REINCUBATED FOR BETTER GROWTH HOLDING FOR POSSIBLE ANAEROBE Performed at [facility], [address], [place], [place] [address]    Report Status PENDING  Incomplete     Radiology Studies: No results found.  Scheduled Meds:  amiodarone   200 mg Oral Daily   apixaban   5 mg Oral BID   vitamin C   500 mg Oral BID   aspirin  EC  81 mg Oral Daily   atorvastatin   10 mg Oral Daily   Chlorhexidine  Gluconate Cloth  6 each Topical Daily   citalopram   20 mg Oral Daily   feeding supplement (NEPRO CARB STEADY)  237 mL Oral TID BM   furosemide   40 mg Oral Daily   guaiFENesin   600 mg Oral BID   insulin  aspart  0-5 Units Subcutaneous QHS   insulin  aspart  0-6 Units Subcutaneous TID WC   insulin  aspart  4 Units Subcutaneous TID WC   insulin  glargine  22 Units Subcutaneous Daily   irbesartan   300 mg Oral Daily   melatonin  2.5 mg Oral QHS   multivitamin with minerals  1 tablet Oral Daily   pantoprazole   40 mg Oral Daily   spironolactone   25 mg Oral Daily   Continuous Infusions:  piperacillin -tazobactam (ZOSYN )  IV 3.375 g (08/21/24 1121)     LOS: 11 days   Norval Bar, MD  [facility]  08/21/2024, 12:08 PM   "

## 2024-08-22 ENCOUNTER — Other Ambulatory Visit: Payer: Self-pay

## 2024-08-22 ENCOUNTER — Telehealth: Payer: Self-pay

## 2024-08-22 LAB — BASIC METABOLIC PANEL WITH GFR
Anion gap: 10 (ref 5–15)
BUN: 26 mg/dL — ABNORMAL HIGH (ref 8–23)
CO2: 29 mmol/L (ref 22–32)
Calcium: 9.2 mg/dL (ref 8.9–10.3)
Chloride: 95 mmol/L — ABNORMAL LOW (ref 98–111)
Creatinine, Ser: 1.07 mg/dL (ref 0.61–1.24)
GFR, Estimated: >60 mL/min
Glucose, Bld: 340 mg/dL — ABNORMAL HIGH (ref 70–99)
Potassium: 4.1 mmol/L (ref 3.5–5.1)
Sodium: 133 mmol/L — ABNORMAL LOW (ref 135–145)

## 2024-08-22 LAB — GLUCOSE, CAPILLARY
Glucose-Capillary: 188 mg/dL — ABNORMAL HIGH (ref 70–99)
Glucose-Capillary: 291 mg/dL — ABNORMAL HIGH (ref 70–99)
Glucose-Capillary: 261 mg/dL — ABNORMAL HIGH (ref 70–99)
Glucose-Capillary: 364 mg/dL — ABNORMAL HIGH (ref 70–99)

## 2024-08-22 LAB — SURGICAL PATHOLOGY

## 2024-08-22 MED ORDER — LINEZOLID 600 MG PO TABS
600.0000 mg | ORAL_TABLET | Freq: Two times a day (BID) | ORAL | 0 refills | Status: AC
  Filled 2024-08-22: qty 14, 7d supply, fill #0

## 2024-08-22 MED ORDER — NEPRO/CARBSTEADY PO LIQD
237.0000 mL | Freq: Three times a day (TID) | ORAL | 0 refills | Status: AC
  Filled 2024-08-22: qty 237, 1d supply, fill #0

## 2024-08-22 MED ORDER — ASCORBIC ACID 500 MG PO TABS
500.0000 mg | ORAL_TABLET | Freq: Two times a day (BID) | ORAL | 0 refills | Status: DC
  Filled 2024-08-22: qty 20, 10d supply, fill #0

## 2024-08-22 MED ORDER — HYDROCODONE-ACETAMINOPHEN 5-325 MG PO TABS
1.0000 | ORAL_TABLET | ORAL | 0 refills | Status: AC | PRN
  Filled 2024-08-22: qty 20, 4d supply, fill #0

## 2024-08-22 MED ORDER — ASPIRIN 81 MG PO TBEC
81.0000 mg | DELAYED_RELEASE_TABLET | Freq: Every day | ORAL | 12 refills | Status: AC
  Filled 2024-08-22: qty 30, 30d supply, fill #0

## 2024-08-22 MED ORDER — INSULIN ASPART 100 UNIT/ML IJ SOLN: 0.0000 [IU] | Freq: Every day | INTRAMUSCULAR | Status: DC

## 2024-08-22 MED ORDER — INSULIN GLARGINE 100 UNIT/ML ~~LOC~~ SOLN
26.0000 [IU] | Freq: Every day | SUBCUTANEOUS | Status: DC
  Administered 2024-08-22: 26 [IU] via SUBCUTANEOUS
  Filled 2024-08-22: qty 0.26

## 2024-08-22 MED ORDER — INSULIN ASPART 100 UNIT/ML IJ SOLN
6.0000 [IU] | Freq: Three times a day (TID) | INTRAMUSCULAR | Status: DC
  Administered 2024-08-22 (×2): 6 [IU] via SUBCUTANEOUS
  Filled 2024-08-22 (×2): qty 6

## 2024-08-22 MED ORDER — ADULT MULTIVITAMIN W/MINERALS CH
1.0000 | ORAL_TABLET | Freq: Every day | ORAL | 0 refills | Status: AC
  Filled 2024-08-22: qty 30, 30d supply, fill #0

## 2024-08-22 MED ORDER — AMOXICILLIN-POT CLAVULANATE 875-125 MG PO TABS
1.0000 | ORAL_TABLET | Freq: Two times a day (BID) | ORAL | 0 refills | Status: AC
  Filled 2024-08-22: qty 8, 4d supply, fill #0

## 2024-08-22 MED ORDER — INSULIN ASPART 100 UNIT/ML IJ SOLN
0.0000 [IU] | Freq: Three times a day (TID) | INTRAMUSCULAR | Status: DC
  Administered 2024-08-22: 8 [IU] via SUBCUTANEOUS
  Administered 2024-08-22: 15 [IU] via SUBCUTANEOUS
  Filled 2024-08-22: qty 7

## 2024-08-22 NOTE — Discharge Summary (Addendum)
 " [facility] Physician Discharge Summary   Patient name: [name]  Admit date:     08/10/2024  Discharge date: 08/22/2024  Attending Physician: CLEATUS DELAYNE GAILS [[id number]]  Discharge Physician: Norval Bar   PCP: Norleen Lynwood LELON, MD  Admitted From: Home  Disposition:  Home  Recommendations for Outpatient Follow-up:  Follow up with PCP in 1-2 weeks Finish antibiotic course of 7 days post amputation Follow up with podiatry clinic in one week  Home Health:No Equipment/Devices: @ECDMELIST @  Discharge Condition:Stable CODE STATUS:FULL Diet recommendation: Heart Healthy/Diabetic Fluid Restriction: None  Hospital Summary: 69 y.o. male with medical history significant for PAD, osteomyelitis s/p toe amputation, DM, HTN, bladder cancer, paroxysmal atrial fibrillation on Eliquis , , depression being admitted for cellulitis left foot with possible osteomyelitis.  He was admitted for the same in August 2025, undergoing amputation of the left great toe at that time, and he was treated with meropenem -> Cipro  to complete 4 weeks after OR cultures grew Pseudomonas, Enterobacter and Proteus.  Today he presented by EMS with pain and swelling of the left foot with redness as well as dizziness and weakness, which caused him to fall hitting his head on 1 occasion but without loss of consciousness.  He reports not having electricity in the house and cannot power his cell phone.  He decided to come into the ED to get checked out. In the ED, temp of 99.2 with otherwise normal vitals.  Labs notable for WBC 19,000 but otherwise unremarkable. EKG with no acute concerning findings. Venous ultrasound negative for DVT.  Admitted for L foot gangrene, cellulitis with osteomyelitis, severe LLE peripheral arterial disease, and uncontrolled T2DM.  Hospital Course by Problem:  Transient Acute encephalopathy Etiology unlcear, likely in the setting of acute infection and prolonged hospital stay. No overt  metabolic derangements. Discontinued indwelling foley placed during revascularization and amputation surgeries and passed voiding trial. - monitor clinically  Gangrene (HCC) PAD (peripheral artery disease) CAD Tissue on the bottom of the foot and second toe does not look good. Angiogram done on 1/5. Status post Left common femoral, profunda femoris, and superficial femoral artery endarterectomies and patch angioplasty; Left lower extremity angiogram; Mechanical thrombectomy to the left SFA and was proximal popliteal artery with the 8 French Rota Rex device; Stent placement x 2 to the left SFA and proximal popliteal artery with 6 mm diameter by 15 cm length and 7 mm diameter by 15 cm length Viabahn stents   Status post TMA 1/8 - Cont ASA, statin, eliquis  - Will need to follow up with Vascular surgery in 2-3 weeks    Cellulitis of left foot Also with numerous foot wounds and starting of gangrene.   Appreciate podiatry and vascular surgery consultations.  MRI does not show any signs of osteomyelitis but tissue on the bottom of the foot does not look good.  Wound culture growing moderate Staph aureus, Morganella and vancomycin -resistant Enterococcus.   Morganella does show resistance and we do not have an oral choice. - Patient started on aggressive antibiotics initially with vancomycin  and Zosyn . Vancomycin  discontinued on 1/4. Continued on Zosyn  during admission. Discussed with podiatry who stated it is acceptable to transition to oral augmentin  and forego Morganella coverage as amputation likely removed all infected tissue - plan to finish 7 days on antibiotics (augmentin  and linezolid ) post transmetatarsal amputation - wound care as per podiatry: every 3-day Betadine wet-to-dry dressing on the left foot  - non-weight bearing to LLE in flat post-op shoe - will need  to follow up with podiatry outpatient   Uncontrolled type 2 diabetes mellitus with hypoglycemia, with long-term current use of insulin   (HCC) - cont on home diabetic regimen    PAF (paroxysmal atrial fibrillation) (HCC) Continue amiodarone  Cont eliquis    Essential hypertension Continue irbesartan , spironolactone  and Lasix    Bladder cancer (HCC) Follow with oncology outpatient   Tobacco abuse Nicotine patch.  Patient must stop smoking.  Disposition Patient to return home at this time. He has no relative who can take him in and assist. Unable to arrange for home health services. Given DME to help with ADLs. Appreciate CM, PT and OT service recommendations during admission.  Discharge Diagnoses:  Principal Problem:   Gangrene of left foot (HCC) Active Problems:   Cellulitis of left foot   Uncontrolled type 2 diabetes mellitus with hypoglycemia, with long-term current use of insulin  (HCC)   PAD (peripheral artery disease)   PAF (paroxysmal atrial fibrillation) (HCC)   CAD (coronary artery disease)   Essential hypertension   Bladder cancer (HCC)   Housing instability   Tobacco abuse   HFrEF (heart failure with reduced ejection fraction) (HCC)   Left foot infection   Protein-calorie malnutrition, severe   Discharge Instructions  Discharge Instructions     DME Bedside commode   Complete by: As directed    Patient needs a bedside commode to treat with the following condition: S/P transmetatarsal amputation of foot, left (HCC)   Discharge wound care:   Complete by: As directed    Every 3 days betadine wet to dry dressing on left foot. Wet adaptic with betadine paint then apply over incision, then cover with gauze 4x4, wrap with kerlix and ace wrap.   Increase activity slowly   Complete by: As directed       Allergies as of 08/22/2024       Reactions   Cefepime  Hives   Had allergic reaction to one of these 3 agents, unclear which one   Metoprolol  Hives   Had allergic reaction to one of these 3 agents, unclear which one *Per RN, highly likely this is the cause of allergic rxn   Myrbetriq  [mirabegron ]  Hives   Had allergic reaction to one of these 3 agents, unclear which one        Medication List     STOP taking these medications    doxycycline  100 MG tablet Commonly known as: VIBRA -TABS   Potassium Chloride  ER 20 MEQ Tbcr   potassium chloride  SA 20 MEQ tablet Commonly known as: KLOR-CON  M   predniSONE  50 MG tablet Commonly known as: DELTASONE        TAKE these medications    Accu-Chek Guide Test test strip Generic drug: glucose blood 1 each by Other route 3 (three) times daily. Use as instructed   Accu-Chek Guide w/Device Kit 1 Device by Does not apply route daily in the afternoon.   albuterol  108 (90 Base) MCG/ACT inhaler Commonly known as: VENTOLIN  HFA Inhale 2 puffs into the lungs every 4 (four) hours as needed for wheezing or shortness of breath.   amiodarone  200 MG tablet Commonly known as: PACERONE  Take 200 mg by mouth daily.   amoxicillin -clavulanate 875-125 MG tablet Commonly known as: AUGMENTIN  Take 1 tablet by mouth 2 (two) times daily for 4 days.   ascorbic acid  500 MG tablet Commonly known as: VITAMIN C  Take 1 tablet (500 mg total) by mouth 2 (two) times daily for 10 days.   aspirin  EC 81 MG tablet Take 1  tablet (81 mg total) by mouth daily. Swallow whole. Start taking on: August 23, 2024   atorvastatin  40 MG tablet Commonly known as: LIPITOR  TAKE 1 TABLET BY MOUTH ONCE DAILY   citalopram  20 MG tablet Commonly known as: CELEXA  Take 1 tablet (20 mg total) by mouth daily.   Dexcom G7 Sensor Misc Change every 10 days   Eliquis  5 MG Tabs tablet Generic drug: apixaban  Take 5 mg by mouth 2 (two) times daily.   feeding supplement (NEPRO CARB STEADY) Liqd Take 237 mLs by mouth 3 (three) times daily between meals.   furosemide  40 MG tablet Commonly known as: LASIX  Take 40 mg by mouth daily. What changed: Another medication with the same name was removed. Continue taking this medication, and follow the directions you see here.    HYDROcodone -acetaminophen  5-325 MG tablet Commonly known as: NORCO/VICODIN Take 1 tablet by mouth every 4 (four) hours as needed for up to 5 days for moderate pain (pain score 4-6).   irbesartan  300 MG tablet Commonly known as: AVAPRO  Take 1 tablet (300 mg total) by mouth daily.   multivitamin with minerals Tabs tablet Take 1 tablet by mouth daily. Start taking on: August 23, 2024   pantoprazole  40 MG tablet Commonly known as: PROTONIX  Take 1 tablet (40 mg total) by mouth daily.   spironolactone  25 MG tablet Commonly known as: ALDACTONE  Take 25 mg by mouth daily.   Toujeo  Max SoloStar 300 UNIT/ML Solostar Pen Generic drug: insulin  glargine (2 Unit Dial ) Inject 40 Units into the skin every morning.               Durable Medical Equipment  (From admission, onward)           Start     Ordered   08/22/24 1426  DME 3-in-1  Once        08/22/24 1428   08/22/24 1425  DME standard manual wheelchair with seat cushion  Once       Comments: Patient suffers from acute debilitation in the setting of L transmetatarsal amputation which impairs their ability to perform daily activities like bathing, dressing, feeding, grooming, and toileting in the home.  A walker will not resolve issue with performing activities of daily living. A wheelchair will allow patient to safely perform daily activities. Patient can safely propel the wheelchair in the home or has a caregiver who can provide assistance. Length of need Lifetime. Accessories: elevating leg rests (ELRs), wheel locks, extensions and anti-tippers.   08/22/24 1428   08/22/24 0000  DME Bedside commode       Question:  Patient needs a bedside commode to treat with the following condition  Answer:  S/P transmetatarsal amputation of foot, left (HCC)   08/22/24 1428   08/22/24 0000  For home use only DME 4 wheeled rolling walker with seat       Question:  Patient needs a walker to treat with the following condition  Answer:  S/P  transmetatarsal amputation of foot, left (HCC)   08/22/24 1441              Discharge Care Instructions  (From admission, onward)           Start     Ordered   08/22/24 0000  Discharge wound care:       Comments: Every 3 days betadine wet to dry dressing on left foot. Wet adaptic with betadine paint then apply over incision, then cover with gauze 4x4, wrap with kerlix and  ace wrap.   08/22/24 1428            Allergies[1]  Discharge Exam: Vitals:   08/22/24 0600 08/22/24 1413  BP: 120/62 (!) 155/61  Pulse: 61 63  Resp: 19 18  Temp: 98.5 F (36.9 C) 98 F (36.7 C)  SpO2: 96% 99%    Physical Exam Vitals and nursing note reviewed.  Constitutional:      General: He is not in acute distress.    Appearance: He is ill-appearing.  HENT:     Head: Normocephalic and atraumatic.  Cardiovascular:     Rate and Rhythm: Normal rate and regular rhythm.     Pulses: Normal pulses.     Heart sounds: Normal heart sounds.  Pulmonary:     Effort: Pulmonary effort is normal.     Breath sounds: Normal breath sounds.  Abdominal:     General: Bowel sounds are normal.     Palpations: Abdomen is soft.  Musculoskeletal:     Comments: L foot bandage clean/dry/intact  Neurological:     Mental Status: He is alert.     The results of significant diagnostics from this hospitalization (including imaging, microbiology, ancillary and laboratory) are listed below for reference.    Microbiology: Recent Results (from the past 240 hours)  MRSA Next Gen by PCR, Nasal     Status: None   Collection Time: 08/17/24  1:40 PM   Specimen: Nasal Mucosa; Nasal Swab  Result Value Ref Range Status   MRSA by PCR Next Gen NOT DETECTED NOT DETECTED Final    Comment: (NOTE) The GeneXpert MRSA Assay (FDA approved for NASAL specimens only), is one component of a comprehensive MRSA colonization surveillance program. It is not intended to diagnose MRSA infection nor to guide or monitor treatment for  MRSA infections. Test performance is not FDA approved in patients less than 65 years old. Performed at [facility], [address] [place], [place] [address]   Aerobic/Anaerobic Culture w Gram Stain (surgical/deep wound)     Status: None (Preliminary result)   Collection Time: 08/18/24  1:05 PM   Specimen: Path Tissue  Result Value Ref Range Status   Specimen Description   Final    TISSUE LEFT FOOT Performed at [facility], [address] [place], [place] [address]    Special Requests   Final    NONE Performed at [facility] [name] [place], [place] [address]    Gram Stain   Final    NO WBC SEEN FEW VONNE NEGATIVE RODS ABUNDANT GRAM POSITIVE COCCI Performed at [facility], [address] [place], [place] [address]    Culture   Final    ABUNDANT MORGANELLA MORGANII ABUNDANT STAPHYLOCOCCUS AUREUS CULTURE REINCUBATED FOR BETTER GROWTH ABUNDANT ENTEROCOCCUS FAECIUM VANCOMYCIN  RESISTANT ENTEROCOCCUS ISOLATED NO ANAEROBES ISOLATED; CULTURE IN PROGRESS FOR 5 DAYS    Report Status PENDING  Incomplete   Organism ID, Bacteria MORGANELLA MORGANII  Final   Organism ID, Bacteria STAPHYLOCOCCUS AUREUS  Final   Organism ID, Bacteria ENTEROCOCCUS FAECIUM  Final      Susceptibility   Enterococcus faecium - MIC*    AMPICILLIN  >=32 RESISTANT Resistant     VANCOMYCIN  >=32 RESISTANT Resistant     GENTAMICIN  SYNERGY SENSITIVE Sensitive     * ABUNDANT ENTEROCOCCUS FAECIUM   Morganella morganii - MIC*    AMPICILLIN  >=32 RESISTANT Resistant     ERTAPENEM <=0.12 SENSITIVE Sensitive     CIPROFLOXACIN  >=4 RESISTANT Resistant  GENTAMICIN  >=16 RESISTANT Resistant     MEROPENEM  <=0.25 SENSITIVE Sensitive     TRIMETH /SULFA  >=320 RESISTANT Resistant     AMPICILLIN /SULBACTAM >=32 RESISTANT Resistant     PIP/TAZO Value in next row Sensitive      <=4 SENSITIVEThis is a modified FDA-approved test that has been validated and its performance  characteristics determined by the reporting laboratory.  This laboratory is certified under the Clinical Laboratory Improvement Amendments CLIA as qualified to perform high complexity clinical laboratory testing.    * ABUNDANT MORGANELLA MORGANII   Staphylococcus aureus - MIC*    CIPROFLOXACIN  Value in next row Resistant      <=4 SENSITIVEThis is a modified FDA-approved test that has been validated and its performance characteristics determined by the reporting laboratory.  This laboratory is certified under the Clinical Laboratory Improvement Amendments CLIA as qualified to perform high complexity clinical laboratory testing.    ERYTHROMYCIN Value in next row Resistant      <=4 SENSITIVEThis is a modified FDA-approved test that has been validated and its performance characteristics determined by the reporting laboratory.  This laboratory is certified under the Clinical Laboratory Improvement Amendments CLIA as qualified to perform high complexity clinical laboratory testing.    GENTAMICIN  Value in next row Sensitive      <=4 SENSITIVEThis is a modified FDA-approved test that has been validated and its performance characteristics determined by the reporting laboratory.  This laboratory is certified under the Clinical Laboratory Improvement Amendments CLIA as qualified to perform high complexity clinical laboratory testing.    OXACILLIN Value in next row Sensitive      <=4 SENSITIVEThis is a modified FDA-approved test that has been validated and its performance characteristics determined by the reporting laboratory.  This laboratory is certified under the Clinical Laboratory Improvement Amendments CLIA as qualified to perform high complexity clinical laboratory testing.    TETRACYCLINE Value in next row Resistant      <=4 SENSITIVEThis is a modified FDA-approved test that has been validated and its performance characteristics determined by the reporting laboratory.  This laboratory is certified under the  Clinical Laboratory Improvement Amendments CLIA as qualified to perform high complexity clinical laboratory testing.    VANCOMYCIN  Value in next row Sensitive      <=4 SENSITIVEThis is a modified FDA-approved test that has been validated and its performance characteristics determined by the reporting laboratory.  This laboratory is certified under the Clinical Laboratory Improvement Amendments CLIA as qualified to perform high complexity clinical laboratory testing.    TRIMETH /SULFA  Value in next row Resistant      <=4 SENSITIVEThis is a modified FDA-approved test that has been validated and its performance characteristics determined by the reporting laboratory.  This laboratory is certified under the Clinical Laboratory Improvement Amendments CLIA as qualified to perform high complexity clinical laboratory testing.    CLINDAMYCIN  Value in next row Resistant      <=4 SENSITIVEThis is a modified FDA-approved test that has been validated and its performance characteristics determined by the reporting laboratory.  This laboratory is certified under the Clinical Laboratory Improvement Amendments CLIA as qualified to perform high complexity clinical laboratory testing.    RIFAMPIN Value in next row Sensitive      <=4 SENSITIVEThis is a modified FDA-approved test that has been validated and its performance characteristics determined by the reporting laboratory.  This laboratory is certified under the Clinical Laboratory Improvement Amendments CLIA as qualified to perform high complexity clinical laboratory testing.    Inducible Clindamycin  Value  in next row Sensitive      <=4 SENSITIVEThis is a modified FDA-approved test that has been validated and its performance characteristics determined by the reporting laboratory.  This laboratory is certified under the Clinical Laboratory Improvement Amendments CLIA as qualified to perform high complexity clinical laboratory testing.    LINEZOLID  Value in next row  Sensitive      <=4 SENSITIVEThis is a modified FDA-approved test that has been validated and its performance characteristics determined by the reporting laboratory.  This laboratory is certified under the Clinical Laboratory Improvement Amendments CLIA as qualified to perform high complexity clinical laboratory testing.    * ABUNDANT STAPHYLOCOCCUS AUREUS     Labs: ProBNP, BNP (last 5 results) Recent Labs    03/08/24 2357 06/10/24 2158 07/13/24 0205  BNP 269.2* 600.6* 801.9*   Basic Metabolic Panel: Recent Labs  Lab 08/17/24 1612 08/18/24 0938 08/18/24 2235 08/19/24 0500 08/19/24 1443 08/21/24 0626 08/22/24 1325  NA 138  --   --  128* 133* 133* 133*  K 5.0 4.0  --  4.2 3.9 3.8 4.1  CL 99  --   --  92* 94* 95* 95*  CO2 25  --   --  28 32 30 29  GLUCOSE 230*  --  485* 403* 238* 335* 340*  BUN 19  --   --  18 19 21  26*  CREATININE 1.02  --   --  0.98 1.05 0.98 1.07  CALCIUM  8.9  --   --  8.8* 9.3 9.3 9.2  MG  --   --   --  1.4*  --   --   --   PHOS  --   --   --  2.8  --   --   --    Liver Function Tests: No results for input(s): AST, ALT, ALKPHOS, BILITOT, PROT, ALBUMIN  in the last 168 hours. No results for input(s): LIPASE, AMYLASE in the last 168 hours. No results for input(s): AMMONIA in the last 168 hours. CBC: Recent Labs  Lab 08/16/24 0436 08/16/24 1455 08/16/24 1457 08/17/24 0221 08/18/24 0344 08/19/24 0500  WBC 10.7*  --   --  11.4* 11.6* 13.7*  HGB 12.6* 12.2* 11.9* 13.0 11.9* 11.0*  HCT 36.2* 36.0* 35.0* 38.0* 34.6* 31.7*  MCV 92.8  --   --  93.4 93.8 92.4  PLT 386  --   --  401* 382 364   Cardiac Enzymes: No results for input(s): CKTOTAL, CKMB, CKMBINDEX, TROPONINI, TROPONINIHS in the last 168 hours. BNP: No results for input(s): BNP in the last 168 hours. CBG: Recent Labs  Lab 08/21/24 1652 08/21/24 1705 08/21/24 2113 08/22/24 0809 08/22/24 1214  GLUCAP 297* 316* 319* 291* 261*   D-Dimer No results for  input(s): DDIMER in the last 72 hours. Hgb A1c No results for input(s): HGBA1C in the last 72 hours. Lipid Profile No results for input(s): CHOL, HDL, LDLCALC, TRIG, CHOLHDL, LDLDIRECT in the last 72 hours. Thyroid  function studies No results for input(s): TSH, T4TOTAL, FREET4, T3FREE, THYROIDAB in the last 72 hours.  Invalid input(s): FREET3 Anemia work up No results for input(s): VITAMINB12, FOLATE, FERRITIN, TIBC, IRON, RETICCTPCT in the last 72 hours. Urinalysis    Component Value Date/Time   COLORURINE YELLOW 03/27/2024 2358   APPEARANCEUR CLEAR 03/27/2024 2358   LABSPEC 1.010 03/27/2024 2358   PHURINE 5.0 03/27/2024 2358   GLUCOSEU >=500 (A) 03/27/2024 2358   GLUCOSEU >=1000 (A) 02/22/2024 1454   HGBUR NEGATIVE 03/27/2024 2358  BILIRUBINUR NEGATIVE 03/27/2024 2358   KETONESUR NEGATIVE 03/27/2024 2358   PROTEINUR 30 (A) 03/27/2024 2358   UROBILINOGEN 0.2 02/22/2024 1454   NITRITE NEGATIVE 03/27/2024 2358   LEUKOCYTESUR MODERATE (A) 03/27/2024 2358   Sepsis Labs Recent Labs  Lab 08/16/24 0436 08/17/24 0221 08/18/24 0344 08/19/24 0500  WBC 10.7* 11.4* 11.6* 13.7*    Procedures/Studies: DG Foot 2 Views Left Result Date: 08/18/2024 EXAM: 2 VIEW(S) XRAY OF THE LEFT FOOT 08/18/2024 02:02:36 PM COMPARISON: 08/10/2024 CLINICAL HISTORY: Post-operative state FINDINGS: BONES AND JOINTS: Midfoot surgical staples in place. Postoperative changes. Status post transmetatarsal amputation of the first through fifth ray. Plantar and posterior calcaneal spurs. Expected postsurgical changes. SOFT TISSUES: Soft tissue swelling and subcutaneous emphysema along the plantar surface of the forefoot, consistent with postsurgical changes. IMPRESSION: 1. Status post transmetatarsal amputation of the first through fifth ray with expected postsurgical changes manifested by soft tissue swelling and subcutaneous emphysema along the plantar surface of the forefoot.  Electronically signed by: Greig Pique MD MD 08/18/2024 07:34 PM EST RP Workstation: [id number]   DG C-Arm 1-60 Min-No Report Result Date: 08/17/2024 Fluoroscopy was utilized by the requesting physician.  No radiographic interpretation.   CARDIAC CATHETERIZATION Result Date: 08/16/2024   Mid LAD lesion is 50% stenosed.   Ost LM lesion is 30% stenosed.   There is moderate to severe left ventricular systolic dysfunction.   LV end diastolic pressure is mildly elevated.   There is no aortic valve stenosis.   Anticipated discharge date to be determined.   Continue heparin .  Defer continuation of aspirin  as well as transition back to apixaban  to vascular surgery/internal medicine. Conclusions: Mild-moderate, nonobstructive coronary artery disease with 30% ostial LMCA and 50% mid LAD stenoses that are not hemodynamically significant (RFR 0.92). Mildly elevated left heart filling pressures (PCWP 20, LVEDP 18 mmHg). Upper normal right heart filling pressure (mean RA 5, RV EDP 6 mmHg). Moderate pulmonary hypertension (PA 60/22, mean 35 mmHg; PVR 3 WU). Normal Fick cardiac output/index (CO 5.0 L/min, CI 2.7 L/min/m). Recommendations: Continue medical therapy to prevent progression of moderate, nonobstructive CAD. Continue gentle diuresis and escalation of goal-directed medical therapy for nonischemic cardiomyopathy. Lonni Hanson, MD [facility]  PERIPHERAL VASCULAR CATHETERIZATION Result Date: 08/15/2024 See surgical note for result.  US  ARTERIAL ABI (SCREENING LOWER EXTREMITY) Result Date: 08/12/2024 CLINICAL DATA:  Peripheral arterial disease. EXAM: NONINVASIVE PHYSIOLOGIC VASCULAR STUDY OF BILATERAL LOWER EXTREMITIES TECHNIQUE: Evaluation of both lower extremities were performed at rest, including calculation of ankle-brachial indices with single level Doppler, pressure and pulse volume recording. COMPARISON:  Lower extremity arterial duplex 08/11/2024 FINDINGS: Right ABI:  0.59 Left ABI:  0.45 Right Lower  Extremity: Predominantly monophasic waveforms in the right ankle. Left Lower Extremity:  Irregular waveforms at the left ankle. 0.5-0.79 Moderate PAD IMPRESSION: 1. Right ABI is 0.59 and compatible with moderate peripheral arterial disease. 2. Left ABI is 0.45 and compatible with severe peripheral arterial disease. Electronically Signed   By: Juliene Balder M.D.   On: 08/12/2024 11:11   US  ARTERIAL LOWER EXTREMITY DUPLEX BILATERAL Result Date: 08/11/2024 CLINICAL DATA:  69 year old history of peripheral artery disease. EXAM: BILATERAL LOWER EXTREMITY ARTERIAL DUPLEX SCAN TECHNIQUE: Gray-scale sonography as well as color Doppler and duplex ultrasound was performed to evaluate the arteries of both lower extremities including the common, superficial and profunda femoral arteries, popliteal artery and calf arteries. COMPARISON:  None Available. FINDINGS: Right Lower Extremity ABI: Not obtained. Inflow: Normal common femoral arterial waveforms and velocities. No evidence of inflow (  aortoiliac) disease. Outflow: Occluded appearance proximal limits. The distal superficial femoral and popliteal arteries demonstrate monophasic waveforms sluggish velocities of 26 centimeters/second Runoff: Monophasic posterior and anterior tibial arterial waveforms. Vessels are patent to the ankle. Left Lower Extremity ABI: Not obtained. Inflow: Normal common femoral arterial waveforms and velocities. No evidence of inflow (aortoiliac) disease. Outflow: The superficial femoral artery appears patent proximally and distally with absent flow in the midportion. Sluggish, monophasic waveforms in the patent portions. The popliteal artery appears patent with monophasic waveforms. Runoff: Monophasic posterior and anterior tibial arterial waveforms. Vessels are patent to the ankle. IMPRESSION: 1. 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 2. Occlusion of the mid left superficial femoral artery with distal  reconstitution. 3. No definite evidence of inflow disease. 4. Patent bilateral runoff vessels. Ester Sides, MD [facility] Radiology Electronically Signed   By: Ester Sides M.D.   On: 08/11/2024 17:46   MR FOOT LEFT WO CONTRAST Result Date: 08/11/2024 CLINICAL DATA:  Pain.  Chronic plantar foot wound.  Recent fall. EXAM: MRI OF THE LEFT FOOT WITHOUT CONTRAST TECHNIQUE: Multiplanar, multisequence MR imaging of the left foot was performed. No intravenous contrast was administered. COMPARISON:  Radiographs left foot dated 08/10/2024. MRI of the left foot dated 03/28/2024. FINDINGS: Bones/Joint/Cartilage Status post amputation of the great toe and fifth toe. Soft tissue wound at the plantar forefoot overlying the level of the first metatarsal head with heterogenous fluid signal extending into the subcutaneous tissues at the level of the amputation stump measuring approximately 1.5 x 1.3 x 1.0 cm with foci of susceptibility artifact likely reflecting soft tissue air communicating from the overlying open wound. Bipartite medial hallux sesamoid with similar-appearing increased T2 signal compared to the prior exam. No convincing evidence of acute osteomyelitis at this time. Ligaments Collateral ligaments of the remaining MTP joints appear intact. Lisfranc ligament is intact. Muscles and Tendons Redemonstrated generalized muscular edema without discrete intramuscular collection. No significant tenosynovitis. 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 Soft tissue Soft tissue wound at the plantar medial forefoot, as described above. Subcutaneous edema of the dorsal foot. IMPRESSION: 1. Soft tissue wound at the plantar medial forefoot overlying the level of the first metatarsal head with heterogenous fluid signal extending into the subcutaneous tissues of the amputation stump measuring approximately 1.5 x 1.3 x 1.0 cm, possibly  representing organizing phlegmon, with foci of susceptibility artifact likely reflecting soft tissue air communicating from the overlying open wound. 2. No convincing evidence of acute osteomyelitis at this time. 3. Bipartite medial hallux sesamoid with similar-appearing increased T2 signal compared to the prior exam. 4. Status post amputation of the great toe and fifth toe. 5. 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 Electronically Signed   By: Harrietta Sherry M.D.   On: 08/11/2024 10:30   US  Venous Img Lower Unilateral Left (DVT) Result Date: 08/10/2024 EXAM: ULTRASOUND DUPLEX OF THE LEFT LOWER EXTREMITY VEINS TECHNIQUE: Duplex ultrasound using B-mode/gray scaled imaging and Doppler spectral analysis and color flow was obtained of the deep venous structures of the left lower extremity. COMPARISON: None available. CLINICAL HISTORY: Left leg erythema. FINDINGS: The common femoral vein, femoral vein, popliteal vein, and posterior tibial vein demonstrate normal compressibility with normal color flow and spectral analysis. IMPRESSION: 1. No evidence of deep venous thrombosis. Electronically signed by: Greig Pique MD 08/10/2024 11:57 PM EST RP Workstation: [id number]   DG Foot 2 Views Left Result  Date: 08/10/2024 EXAM: 2 VIEW(S) XRAY OF THE LEFT FOOT 08/10/2024 09:34:00 PM COMPARISON: 03/29/2024 CLINICAL HISTORY: fall, (no foot injury) erythema, eval for gas in mid foot (appears to have a hole in bottom of L foot (chronic appearing)) FINDINGS: BONES AND JOINTS: Status post amputation of the great toe and fifth toe at the level of the MTP joint. Mild cortical irregularity along the medial margin of the remaining first metatarsal head, suspicious for osteomyelitis. Small superior and plantar calcaneal spurs. SOFT TISSUES: Soft tissue swelling along the plantar surface of the forefoot at the amputation site with subcutaneous gas. IMPRESSION: 1. Soft tissue swelling  along the plantar surface of the forefoot at the amputation site with subcutaneous gas. 2. Mild cortical irregularity along the medial margin of the remaining first metatarsal head, which could indicate osteomyelitis. 3. 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 Electronically signed by: Franky Stanford MD 08/10/2024 10:16 PM EST RP Workstation: [id number]   CT Head Wo Contrast Result Date: 08/10/2024 EXAM: CT HEAD WITHOUT CONTRAST 08/10/2024 09:28:38 PM TECHNIQUE: CT of the head was performed without the administration of intravenous contrast. Automated exposure control, iterative reconstruction, and/or weight based adjustment of the mA/kV was utilized to reduce the radiation dose to as low as reasonably achievable. COMPARISON: 07/13/2024 CLINICAL HISTORY: Facial trauma, blunt; fall, on anticoag. FINDINGS: BRAIN AND VENTRICLES: No acute hemorrhage. No evidence of acute infarct. No hydrocephalus. No extra-axial collection. No mass effect or midline shift. Stable age-related cerebral volume loss. Stable mild periventricular white matter disease. ORBITS: Bilateral lens replacement. SINUSES: Mucosal disease within ethmoid air cells and left maxillary sinus. SOFT TISSUES AND SKULL: No acute soft tissue abnormality. No skull fracture. VASCULATURE: Moderate to heavy calcific plaque within carotid siphons and vertebral arteries. IMPRESSION: 1. No acute intracranial abnormality. Electronically signed by: Donnice Mania MD 08/10/2024 09:38 PM EST RP Workstation: [id number]    Time coordinating discharge: 40 mins  SIGNED:  Norval Bar, MD [facility] 08/22/2024, 2:30 PM     [1]  Allergies Allergen Reactions   Cefepime  Hives    Had allergic reaction to one of these 3 agents, unclear which one   Metoprolol  Hives    Had allergic reaction to one of these 3 agents, unclear which one  *Per RN, highly likely this is the cause of allergic rxn   Myrbetriq   [Mirabegron ] Hives    Had allergic reaction to one of these 3 agents, unclear which one   "

## 2024-08-22 NOTE — Progress Notes (Signed)
"  °  Subjective:  Patient ID: [name], male    DOB: 1955-08-29,  MRN: [id number]  Chief Complaint  Patient presents with   Fall    DOS: 08/18/2024 Procedure: Left foot transmetatarsal amputation  69 y.o. male seen for post op check.  Patient reports some pain in the left foot today.  He says he needs a new shoe as the shoe is not working currently on the left side.  Says it does not fit him well.  He also hopes to get a flat shoe discussed plans for follow-up and aftercare.  Review of Systems: Negative except as noted in the HPI. Denies N/V/F/Ch.   Objective:   Constitutional Well developed. Well nourished.  Vascular Foot warm and well perfused. No calf pain with palpation  Neurologic Normal speech. Oriented to person, place, and time. Epicritic sensation diminished to left foot  Dermatologic Amputation site still well coapted on the left foot there is a central area with mild maceration no no dehiscence or drainage.  Laterally there is some plantar necrosis of the plantar lateral flap however does not appear to be grossly infected there.  Still no dehiscence and no evidence of obvious postop infection at this point.   Orthopedic: Status post left foot TMA   Radiographs: 1. Status post transmetatarsal amputation of the first through fifth ray with expected postsurgical changes manifested by soft tissue swelling and subcutaneous emphysema along the plantar surface of the forefoot.  Pathology: Pending  Micro:   ABUNDANT MORGANELLA MORGANII ABUNDANT STAPHYLOCOCCUS AUREUS CULTURE REINCUBATED FOR BETTER GROWTH ABUNDANT ENTEROCOCCUS FAECIUM  Prior culture with MSSA Enterococcus and Morganella.  The MSSA and Enterococcus is sensitive to Augmentin .  Assessment:   1. Left foot infection   2. 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  POD # 4 s/p left foot transmetatarsal amputation -Progressing as expected postoperatively there is some necrosis of the plantar lateral flap though still well coapted without evidence of dehiscence or residual infection at this time. -Recommend we continue with every 3-day Betadine wet-to-dry dressing on the left foot starting tomorrow.  I replaced a new Betadine dressing today.  Orders entered.  He should try to continue this at home if discharged home. -XR: Expected postoperative changes -WB Status: Nonweightbearing in flat postop shoe will order him a new shoe he has a forefoot offloading shoe at this time -Sutures: Remain intact. -Medications/ABX: Discussed with pharmacy this morning.  Recommend Augmentin  x 7 days should cover MSSA and Enterococcus unfortunately there is no oral option for Morganella -Dressing: Betadine wet-to-dry dressing every 3 days per nursing as ordered. - F/u Plan: Will sign off patient stable for discharge from podiatry standpoint at this time recommend he follow-up in my office in 1-2 week approximately.  Do feel that skilled nursing facility would be better disposition option for him at home however they may not be an option for him.  He is requesting to speak with [facility] team today.        Marolyn JULIANNA Honour, DPM [facility]  "

## 2024-08-22 NOTE — Telephone Encounter (Signed)
 Copied from CRM #[id number]. Topic: General - Other >> Aug 22, 2024  1:39 PM Aisha D wrote: Reason for CRM: Pt stated that he is currently in the hospital with no transportation  and that's why he was unable to make his appt on 08/16/24 with Dr.John.

## 2024-08-22 NOTE — Plan of Care (Signed)
  Problem: Clinical Measurements: Goal: Will remain free from infection Outcome: Progressing Goal: Respiratory complications will improve Outcome: Progressing   Problem: Activity: Goal: Risk for activity intolerance will decrease Outcome: Progressing   Problem: Coping: Goal: Level of anxiety will decrease Outcome: Progressing   Problem: Elimination: Goal: Will not experience complications related to urinary retention Outcome: Progressing   Problem: Safety: Goal: Ability to remain free from injury will improve Outcome: Progressing

## 2024-08-22 NOTE — Progress Notes (Signed)
 SPIRITUAL CARE AND COUNSELING CONSULT NOTE   VISIT SUMMARY Initial visit for spiritual/emotional support while rounding on unit. Chaplain consulted with nurse team.   [name]                                                                                                                                                                      Type of Visit: Initial Care provided to:: Patient Conversation partners present during encounter: Nurse Reason for visit: Routine spiritual support OnCall Visit: No   SPIRITUAL FRAMEWORK  Presenting Themes: Values and beliefs, Courage hope and growth, Meaning/purpose/sources of inspiration, Community and relationships Values/beliefs: [employer] Community/Connection: Limited, None, Other (comment) Strengths: Mind[employer], faith, perspective Needs/Challenges/Barriers: Community support / resources - transportation to home & financial support (insurance) Patient Stress Factors: Financial concerns, Family relationships Family Stress Factors: None identified   GOALS   Self/Personal Goals: Healing, community support/resources Clinical Care Goals: Healing, discharge support   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 Leam is primarily alone and supports himself. Najir is disappointed in the support he has received from his insurance and the support for transportation being provided for his discharge. Marquie wants support from community and external partners for when he goes home, but is optimistic that God will provide and take care of everything.  INTERVENTIONS   Spiritual Care Interventions Made: Established relationship of care and support, Explored values/beliefs/practices/strengths, Prayer, Explored ethical dilemma, Reflective listening    INTERVENTION OUTCOMES   Outcomes: Autonomy/agency, Reduced isolation, Awareness around self/spiritual resourses  SPIRITUAL CARE PLAN   Spiritual Care  Issues Still Outstanding: Chaplain will continue to follow, Additional issues (comment) (connected with team to communicate discharge assistance and financial support.)   Chaplain services remains available for follow up spiritual support.   If immediate needs arise, please contact [facility] 24 hour on call [phone]   Barabara Chess, Chaplain  08/22/2024 4:25 PM

## 2024-08-22 NOTE — Progress Notes (Signed)
 Occupational Therapy Treatment Patient Details Name: [name] MRN: [id number] DOB: 12/28/1955 Today's Date: 08/22/2024   History of present illness 69 y.o. male presented by EMS with pain, redness and swelling of the left foot as well as dizziness and weakness, which caused him to fall hitting his head. Per podiatry NWB to LLE in offloading post op shoe. 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 Pt is s/p transmet amputation on 08/18/24.   OT comments  Pt is supine in bed on arrival. Easily arousable and agreeable to OT session. Pt performed bed mobility with supervision and increased time/effort. Min A +1 and use of RW required to stand from EOB and stand pivot from bed>BSC with multimodal cues for technique and hand placement. Pt did maintain NWB to LLE during transfers and ambulation to recliner this date with frequent reminders. UB/LB bathing, dressing and toileting performed. Min A for LB bathing and supervision for UB ADLs. CGA for seated peri-care via lateral leans after cont BM on BSC. Pt able to hop on RLE from [facility] ~2-3 hops into recliner with Min A and heavy UE support on RW. Pt left seated in recliner with all needs in place and will cont to require skilled acute OT services to maximize his safety and IND to return to PLOF.       If plan is discharge home, recommend the following:  A little help with walking and/or transfers;A little help with bathing/dressing/bathroom;Help with stairs or ramp for entrance;Assistance with cooking/housework   Equipment Recommendations  BSC/3in1    Recommendations for Other Services      Precautions / Restrictions Restrictions Weight Bearing Restrictions Per Provider Order: Yes LLE Weight Bearing Per Provider Order: Non weight bearing LLE Partial Weight Bearing Percentage or Pounds: postop shoe        Mobility Bed Mobility Overal bed mobility: Needs Assistance Bed Mobility: Supine to Sit     Supine to sit: Supervision     General bed mobility comments: no physical assist to come into long sitting then to EOB with increased time and effort    Transfers Overall transfer level: Needs assistance Equipment used: Rolling walker (2 wheels) Transfers: Sit to/from Stand, Bed to chair/wheelchair/BSC Sit to Stand: Min assist Stand pivot transfers: Min assist         General transfer comment: able to stand from EOB to RW with Min A and perform stand pivot to [facility] using RW with cues for hand placement and technique with Min A +1, although pt impulsive and does things his way, frequent multimodal cueing for safety; able to hop on RLE from [facility] to recliner while maintaining NWB on LLE throughout     Balance Overall balance assessment: Needs assistance Sitting-balance support: Feet supported, No upper extremity supported Sitting balance-Leahy Scale: Normal     Standing balance support: Reliant on assistive device for balance, During functional activity Standing balance-Leahy Scale: Poor Standing balance comment: Min A +1 +RW for standing and transfers with heavy reliance on BUEs on RW                           ADL either performed or assessed with clinical judgement   ADL Overall ADL's : Needs assistance/impaired     Grooming: Wash/dry hands;Sitting;Set up;Supervision/safety Grooming Details (indicate cue type and reason): seated on BSC Upper Body  Bathing: Supervision/ safety;Sitting Upper Body Bathing Details (indicate cue type and reason): seated on BSC Lower Body Bathing: Moderate assistance;Sit to/from stand;Sitting/lateral leans Lower Body Bathing Details (indicate cue type and reason): seated on BSC for below the knee assist Upper Body Dressing : Minimal assistance;Sitting Upper Body Dressing Details (indicate cue type and reason): doff/donn gown     Toilet  Transfer: Contact guard assist;Rolling walker (2 wheels);BSC/3in1;Stand-pivot Toilet Transfer Details (indicate cue type and reason): bed>BSC Toileting- Clothing Manipulation and Hygiene: Contact guard assist;Sitting/lateral lean Toileting - Clothing Manipulation Details (indicate cue type and reason): able to perform peri-care after cont BM on BSC via seated lateral leans with CGA for preventing LOB            Extremity/Trunk Assessment              Vision       Perception     Praxis     Communication Communication Communication: Impaired Factors Affecting Communication: Hearing impaired   Cognition Arousal: Alert Behavior During Therapy: WFL for tasks assessed/performed                                 Following commands: Impaired Following commands impaired: Follows one step commands with increased time      Cueing   Cueing Techniques: Verbal cues, Tactile cues, Visual cues  Exercises      Shoulder Instructions       General Comments      Pertinent Vitals/ Pain       Pain Assessment Pain Assessment: Faces Faces Pain Scale: Hurts a little bit Pain Location: L foot Pain Descriptors / Indicators: Discomfort Pain Intervention(s): Monitored during session, Repositioned, Limited activity within patient's tolerance  Home Living                                          Prior Functioning/Environment              Frequency  Min 2X/week        Progress Toward Goals  OT Goals(current goals can now be found in the care plan section)  Progress towards OT goals: Progressing toward goals  Acute Rehab OT Goals Patient Stated Goal: get better OT Goal Formulation: With patient Time For Goal Achievement: 09/02/24 Potential to Achieve Goals: Fair  Plan      Co-evaluation                 AM-PAC OT 6 Clicks Daily Activity     Outcome Measure   Help from another person eating meals?: None Help from another  person taking care of personal grooming?: None Help from another person toileting, which includes using toliet, bedpan, or urinal?: A Little Help from another person bathing (including washing, rinsing, drying)?: A Little Help from another person to put on and taking off regular upper body clothing?: None Help from another person to put on and taking off regular lower body clothing?: A Little 6 Click Score: 21    End of Session Equipment Utilized During Treatment: Rolling walker (2 wheels)  OT Visit Diagnosis: Other abnormalities of gait and mobility (R26.89);Muscle weakness (generalized) (M62.81);Pain Pain - Right/Left: Left Pain - part of body: Ankle and joints of foot   Activity Tolerance Patient tolerated treatment well   Patient Left with call bell/phone within reach;in chair;with chair  alarm set;with nursing/sitter in room (telesitter)   Nurse Communication Mobility status        Time: 8955-8885 OT Time Calculation (min): 30 min  Charges: OT General Charges $OT Visit: 1 Visit OT Treatments $Self Care/Home Management : 23-37 mins  Deiona Hooper Chrismon, OTR/L  08/22/2024, 1:42 PM   Isebella Upshur E Chrismon 08/22/2024, 1:39 PM

## 2024-08-22 NOTE — TOC Transition Note (Signed)
 Transition of Care [facility]) - Discharge Note   Patient Details  Name: [name] MRN: [id number] Date of Birth: 1956/02/01  [facility] CM/SW Contact:  Corean ONEIDA Haddock, RN Phone Number: 08/22/2024, 2:50 PM   Clinical Narrative:     Patient to discharge today Per MD patient will not require IV antibiotics at discharge  Met with patient at bedside Reviewed with patient that recs are still for SNF Patient again declines.  He states he will be returning home.   VM left for [facility]  with [facility] [phone]). To update on dc planning Patient is aware that he will not be eligible due to no power in the home.  Patient confirms that he has firewood and gas in the home for heat  Resources for food, housing, transportation, utilities, and social isolation added to AVS  Patient confirms that he has a cane, 2 wheelchairs, bsc in the home.  He states that his rollator is broken.  Referral made to Mitch with [employer] to be delivered to home  EMS packet on chart.  Confirmed address on file is on file.  Patient states that the door is unlocked   Ambulance transport called for patient Patient will receive meds to bed prior to dc         Patient Goals and CMS Choice            Discharge Placement                       Discharge Plan and Services Additional resources added to the After Visit Summary for                                       Social Drivers of Health (SDOH) Interventions SDOH Screenings   Food Insecurity: Food Insecurity Present (08/11/2024)  Housing: High Risk (08/11/2024)  Transportation Needs: Unmet Transportation Needs (08/11/2024)  Utilities: At Risk (08/15/2024)  Alcohol Screen: Low Risk (06/10/2023)  Depression (PHQ2-9): Low Risk (07/05/2024)  Financial Resource Strain: High Risk (03/11/2024)  Physical Activity: Inactive (06/10/2023)  Social Connections: Socially Isolated (08/11/2024)  Stress: Stress Concern Present  (03/11/2024)  Tobacco Use: High Risk (08/18/2024)  Health Literacy: Inadequate Health Literacy (02/17/2024)     Readmission Risk Interventions    03/30/2024    2:55 PM 03/09/2024    3:21 PM 12/30/2023    1:17 PM  Readmission Risk Prevention Plan  Post Dischage Appt   Complete  Medication Screening   Complete  Transportation Screening Complete Complete Complete  PCP or Specialist Appt within 5-7 Days  Complete   PCP or Specialist Appt within 3-5 Days Complete    Home Care Screening  Complete   Medication Review (RN CM)  Complete   HRI or Home Care Consult Complete    Social Work Consult for Recovery Care Planning/Counseling Complete    Palliative Care Screening Complete    Medication Review Oceanographer) Referral to Pharmacy

## 2024-08-23 ENCOUNTER — Telehealth: Payer: Self-pay

## 2024-08-23 ENCOUNTER — Telehealth (INDEPENDENT_AMBULATORY_CARE_PROVIDER_SITE_OTHER): Payer: Self-pay | Admitting: Nurse Practitioner

## 2024-08-23 ENCOUNTER — Telehealth: Payer: Self-pay | Admitting: *Deleted

## 2024-08-23 ENCOUNTER — Ambulatory Visit: Admitting: Physician Assistant

## 2024-08-23 LAB — AEROBIC/ANAEROBIC CULTURE W GRAM STAIN (SURGICAL/DEEP WOUND): Gram Stain: NONE SEEN

## 2024-08-23 LAB — HEMOGLOBIN A1C
Hgb A1c MFr Bld: 8.3 % — ABNORMAL HIGH (ref 4.8–5.6)
Mean Plasma Glucose: 191.51 mg/dL

## 2024-08-23 NOTE — Telephone Encounter (Signed)
 Tried to call patient 3 different times to set up appointment w/[facility]. No answer and voicemail box not set up.  Fu 2-3 weeks + ABI see FB/JD per secure chat from FB

## 2024-08-23 NOTE — Transitions of Care (Post Inpatient/ED Visit) (Signed)
 "  08/23/2024  Name: [name] MRN: [id number] DOB: 1956/04/05  Today's TOC FU Call Status: Today's TOC FU Call Status:: Successful TOC FU Call Completed TOC FU Call Complete Date: 08/23/24  Patient's Name and Date of Birth confirmed. Name, DOB  Transition Care Management Follow-up Telephone Call Date of Discharge: 08/22/24 Discharge Facility: [facility]) Type of Discharge: Inpatient Admission Primary Inpatient Discharge Diagnosis:: (L) foot amputation How have you been since you were released from the hospital?: Better (I guess I am okay, I am trying to get all of this straight, but I don't know if I am doing it right.  I have all the same problems, and my phone is losing its charge.  Just have the social worker call me as soon as she can, she is who I need to talk to) Any questions or concerns?: Yes Patient Questions/Concerns:: Continues to verbalize multiple ongoing SDOH needs as per baseline- confirmed well established with [facility] LCSW; care coordination outreach to LCSW to update her on patient's discharge home/ refusal of ST rehabilitation placement; possible need to become established with [facility] longitudinal RN CM in the future: patient acknowledges today he needs to speak to LCSW as priority; he has concerns about having too many phone calls due to his cell phone plan Patient Questions/Concerns Addressed: Other: (Care Coordination outreach to established [facility] LCSW to update on patient's discharge home from hospital 1/12/256- need for sooner outreach than currently scheduled)-- care coordination phone call with [facility] LCSW in TOC follow up- 25 minutes  Patient continues to express concern with having so many phone calls due to his limited finances/ cell phone minutes- well-established with [facility] LCSW: care coordination outreach completed along with request that LCSW place referral for [facility] longitudinal RN CM around her discretion and clinical judgement when  patient has resolution of his concerns around his limited cell phone plan for minutes he can talk  Items Reviewed: Did you receive and understand the discharge instructions provided?: Yes (declines specific review of AVS/ discharge instructions- verbalizes concerns that his cell phone is about to lose it's charge completely; and he expresses desire to get off the phone as soon as possible) Medications obtained,verified, and reconciled?: Partial Review Completed Reason for Partial Mediation Review: declines full medication review- verbalizes concerns that his cell phone is about to lose it's charge completely; and he expresses desire to get off the phone as soon as possible Any new allergies since your discharge?: No Dietary orders reviewed?: No (verbalizes concerns that his cell phone is about to lose it's charge completely; and he expresses desire to get off the phone as soon as possible) Do you have support at home?: Yes (per patient brief report: all support remains minimal/ inconsistent- primarily through neighbors, but only when they can) People in Home [RPT]: alone Name of Support/Comfort Primary Source: Reports essentially independent in self-care activities; resides alone; reports neighbors sometimes help and assists as/ if needed/ indicated  Medications Reviewed Today: Medications Reviewed Today     Reviewed by Asharia Lotter M, RN (Registered Nurse) on 08/23/24 at 1536  Med List Status: <None>   Medication Order Taking? Sig Documenting Provider Last Dose Status Informant  albuterol  (VENTOLIN  HFA) 108 (90 Base) MCG/ACT inhaler [id number]  Inhale 2 puffs into the lungs every 4 (four) hours as needed for wheezing or shortness of breath. Ula Prentice SAUNDERS, MD  Active Self  amiodarone  (PACERONE ) 200 MG tablet [id number]  Take 200 mg by mouth daily. [provider]  Active Self  amoxicillin -clavulanate (AUGMENTIN ) 875-125 MG tablet [id number]  Take 1 tablet by mouth 2 (two) times  daily for 4 days. Cosette Blackwater, MD  Active   ascorbic acid  (VITAMIN C ) 500 MG tablet [id number]  Take 1 tablet (500 mg total) by mouth 2 (two) times daily for 10 days. Cosette Blackwater, MD  Active   aspirin  EC 81 MG tablet [id number]  Take 1 tablet (81 mg total) by mouth daily. Swallow whole. Cosette Blackwater, MD  Active   atorvastatin  (LIPITOR ) 40 MG tablet [id number]  TAKE 1 TABLET BY MOUTH ONCE DAILY Norleen Lynwood ORN, MD  Active Self  Blood Glucose Monitoring Suppl (ACCU-CHEK GUIDE) w/Device KIT [id number]  1 Device by Does not apply route daily in the afternoon. Shamleffer, Ibtehal Jaralla, MD  Active Self  citalopram  (CELEXA ) 20 MG tablet [id number]  Take 1 tablet (20 mg total) by mouth daily. Norleen Lynwood ORN, MD  Active Self  Continuous Glucose Sensor (DEXCOM G7 SENSOR) [place] [id number]  Change every 10 days Shamleffer, Donell Cardinal, MD  Active Self  ELIQUIS  5 MG TABS tablet [id number]  Take 5 mg by mouth 2 (two) times daily. [provider]  Active Self  furosemide  (LASIX ) 40 MG tablet [id number]  Take 40 mg by mouth daily. [provider]  Active Self  glucose blood (ACCU-CHEK GUIDE TEST) test strip [id number]  1 each by Other route 3 (three) times daily. Use as instructed Shamleffer, Donell Cardinal, MD  Active Self  HYDROcodone -acetaminophen  (NORCO/VICODIN) 5-325 MG tablet [id number]  Take 1 tablet by mouth every 4 (four) hours as needed for up to 5 days for moderate pain (pain score 4-6). Cosette Blackwater, MD  Active   insulin  glargine, 2 Unit Dial , (TOUJEO  MAX SOLOSTAR) 300 UNIT/ML Solostar Pen [id number]  Inject 40 Units into the skin every morning. Norleen Lynwood ORN, MD  Active Self  irbesartan  (AVAPRO ) 300 MG tablet [id number]  Take 1 tablet (300 mg total) by mouth daily. Norleen Lynwood ORN, MD  Active Self  linezolid  (ZYVOX ) 600 MG tablet [id number]  Take 1 tablet (600 mg total) by mouth 2 (two) times daily for 7 days. Cosette Blackwater, MD  Active   Multiple Vitamin (MULTIVITAMIN WITH MINERALS) TABS  tablet [id number]  Take 1 tablet by mouth daily. Cosette Blackwater, MD  Active   Nutritional Supplements (FEEDING SUPPLEMENT, NEPRO CARB STEADY,) LIQD [id number]  Take 237 mLs by mouth 3 (three) times daily between meals. Cosette Blackwater, MD  Active   pantoprazole  (PROTONIX ) 40 MG tablet [id number]  Take 1 tablet (40 mg total) by mouth daily. Norleen Lynwood ORN, MD  Active Self  spironolactone  (ALDACTONE ) 25 MG tablet [id number]  Take 25 mg by mouth daily. [provider]  Active Self           Home Care and Equipment/Supplies: Were Home Health Services Ordered?: No Any new equipment or medical supplies ordered?: Yes (new rolling walker) Name of Medical supply agency?: [employer] Were you able to get the equipment/medical supplies?:  (unable to determine: patient verbalizes concerns that his cell phone is about to lose it's charge completely; and he expresses desire to get off the phone as soon as possible) Do you have any questions related to the use of the equipment/supplies?:  (unable to determine: patient verbalizes concerns that his cell phone is about to lose it's charge completely; and he expresses desire to get off the phone as soon as possible)  Functional Questionnaire: Do you need assistance with bathing/showering or dressing?:  (unable  to determine: patient verbalizes concerns that his cell phone is about to lose it's charge completely; and he expresses desire to get off the phone as soon as possible) Do you need assistance with meal preparation?:  (unable to determine: patient verbalizes concerns that his cell phone is about to lose it's charge completely; and he expresses desire to get off the phone as soon as possible) Do you need assistance with eating?: No Do you have difficulty maintaining continence:  (unable to determine: patient verbalizes concerns that his cell phone is about to lose it's charge completely; and he expresses desire to get off the phone as soon as  possible) Do you need assistance with getting out of bed/getting out of a chair/moving?:  (unable to determine: patient verbalizes concerns that his cell phone is about to lose it's charge completely; and he expresses desire to get off the phone as soon as possible) Do you have difficulty managing or taking your medications?:  (unable to determine: patient verbalizes concerns that his cell phone is about to lose it's charge completely; and he expresses desire to get off the phone as soon as possible)  Follow up appointments reviewed: PCP Follow-up appointment confirmed?: No (Patient does not want to schedule PCP office visit, or anything else per his report that he does not have transportation:  care coordination outreach completed with established [facility] LCSW) MD Provider Line [phone] Given: No (verified well-established with current PCP) Specialist Hospital Follow-up appointment confirmed?: No (Patient does not want to schedule PCP office visit, or anything else per his report that he does not have transportation: care coordination outreach completed with established [facility] LCSW) Do you need transportation to your follow-up appointment?: Yes Transportation Need Intervention Addressed By:: Other: (care coordination outreach completed with established [facility] LCSW to inform of patient's hospital discharge, ongoing multiple complex SW/ SDOH needs as per his baseline) Do you understand care options if your condition(s) worsen?: Yes-patient verbalized understanding  SDOH Interventions Today    Flowsheet Row Most Recent Value  SDOH Interventions   Food Insecurity Interventions Other (Comment)  [confirmed well-established with [facility] LCSW,  care coordination outreach to established [facility] LCSW to request outreach as soon as possible re: ongoing needs]  Housing Interventions Other (Comment)  [confirmed well-established with [facility] LCSW,  care coordination outreach to established [facility] LCSW to  request outreach as soon as possible re: ongoing needs]  Transportation Interventions Other (Comment)  [confirmed well-established with [facility] LCSW,  care coordination outreach to established [facility] LCSW to request outreach as soon as possible re: ongoing needs]  Utilities Interventions Other (Comment)  [confirmed well-established with [facility] LCSW,  care coordination outreach to established [facility] LCSW to request outreach as soon as possible re: ongoing needs: confirmed patient has been provided with resources multiple times in the past]   See TOC assessment tabs for additional assessment/ TOC intervention information  Patient declines need for ongoing/ further care management/ coordination outreach; declines enrollment in 30-day TOC program- declines taking my direct phone number should needs/ concerns arise post-TOC call- however- he requests phone number to established [facility] LCSW, who is currently active in his ongoing care: number to [employer] was provided to patient as requested  Pls call/ message for questions,  Beatris Blinda Lawrence, RN, BSN, Media Planner  Transitions of Care  [facility] - [facility] [phone]: direct office  "

## 2024-08-23 NOTE — Telephone Encounter (Signed)
 Copied from CRM [id number]. Topic: General - Other >> Aug 23, 2024  1:55 PM Macario HERO wrote: Reason for CRM: Patient calling to let provider know that he left the hospital last night and he is unable to walk and does not have transportation.

## 2024-08-23 NOTE — Telephone Encounter (Signed)
 Copied from CRM [id number]. Topic: Clinical - Prescription Issue >> Aug 23, 2024  2:51 PM Deleta RAMAN wrote: Reason for CRM: patient would like pcp to know that he is taking too many medications over 15 prescriptions

## 2024-08-24 NOTE — Telephone Encounter (Signed)
 Ok to let pt know, if he is unable to get around and take of his basic daily activities, and he has no other help, he will need to call 911 for ambulance to go back to ED for failure to thrive.  We can also call 911 or him if he likes.   Thanks

## 2024-08-24 NOTE — Telephone Encounter (Signed)
 Ok this is noted, no new orders for now   thanks

## 2024-08-24 NOTE — Telephone Encounter (Signed)
 Ok this is noted, thanks

## 2024-08-29 ENCOUNTER — Telehealth: Payer: Self-pay | Admitting: Lab

## 2024-08-29 ENCOUNTER — Telehealth: Payer: Self-pay

## 2024-08-29 NOTE — Telephone Encounter (Signed)
 Copied from CRM [id number]. Topic: General - Other >> Aug 29, 2024  8:35 AM Deleta RAMAN wrote: Reason for CRM: patient would like for pcp nurse to give him a call regarding rehab. [phone]

## 2024-08-29 NOTE — Telephone Encounter (Signed)
 Patient has called to get an update on why he hasn't received a call to speak on rehab. Advised that someone will give him a call back when available.

## 2024-08-29 NOTE — Telephone Encounter (Signed)
 Patient states has been home 7 days unable to care for himself states he needs to go to rehab facility please advise.

## 2024-08-29 NOTE — Telephone Encounter (Signed)
 Patient calling back states was sent home without services or post op instructions had fall at home not able to walk or care for himself needs advise on when staples need to be removed has been home 7 days no help please advise.

## 2024-08-30 ENCOUNTER — Inpatient Hospital Stay
Admission: EM | Admit: 2024-08-30 | Discharge: 2024-09-06 | DRG: 475 | Disposition: A | Discharge status: Skilled Nursing Facility | Attending: Internal Medicine | Admitting: Internal Medicine

## 2024-08-30 ENCOUNTER — Telehealth: Payer: Self-pay

## 2024-08-30 ENCOUNTER — Emergency Department

## 2024-08-30 ENCOUNTER — Other Ambulatory Visit: Payer: Self-pay

## 2024-08-30 ENCOUNTER — Encounter: Payer: Self-pay | Admitting: *Deleted

## 2024-08-30 DIAGNOSIS — I255 Ischemic cardiomyopathy: Secondary | ICD-10-CM | POA: Diagnosis present

## 2024-08-30 DIAGNOSIS — K219 Gastro-esophageal reflux disease without esophagitis: Secondary | ICD-10-CM | POA: Diagnosis present

## 2024-08-30 DIAGNOSIS — Z801 Family history of malignant neoplasm of trachea, bronchus and lung: Secondary | ICD-10-CM

## 2024-08-30 DIAGNOSIS — I5022 Chronic systolic (congestive) heart failure: Secondary | ICD-10-CM | POA: Diagnosis present

## 2024-08-30 DIAGNOSIS — F17290 Nicotine dependence, other tobacco product, uncomplicated: Secondary | ICD-10-CM | POA: Diagnosis present

## 2024-08-30 DIAGNOSIS — I2510 Atherosclerotic heart disease of native coronary artery without angina pectoris: Secondary | ICD-10-CM | POA: Diagnosis present

## 2024-08-30 DIAGNOSIS — Z7901 Long term (current) use of anticoagulants: Secondary | ICD-10-CM

## 2024-08-30 DIAGNOSIS — E1140 Type 2 diabetes mellitus with diabetic neuropathy, unspecified: Secondary | ICD-10-CM | POA: Diagnosis present

## 2024-08-30 DIAGNOSIS — I110 Hypertensive heart disease with heart failure: Secondary | ICD-10-CM | POA: Diagnosis present

## 2024-08-30 DIAGNOSIS — Z881 Allergy status to other antibiotic agents status: Secondary | ICD-10-CM

## 2024-08-30 DIAGNOSIS — R296 Repeated falls: Secondary | ICD-10-CM | POA: Diagnosis present

## 2024-08-30 DIAGNOSIS — W19XXXA Unspecified fall, initial encounter: Secondary | ICD-10-CM

## 2024-08-30 DIAGNOSIS — Z8551 Personal history of malignant neoplasm of bladder: Secondary | ICD-10-CM

## 2024-08-30 DIAGNOSIS — Z794 Long term (current) use of insulin: Secondary | ICD-10-CM

## 2024-08-30 DIAGNOSIS — R627 Adult failure to thrive: Principal | ICD-10-CM | POA: Diagnosis present

## 2024-08-30 DIAGNOSIS — E1152 Type 2 diabetes mellitus with diabetic peripheral angiopathy with gangrene: Secondary | ICD-10-CM | POA: Diagnosis present

## 2024-08-30 DIAGNOSIS — Z7982 Long term (current) use of aspirin: Secondary | ICD-10-CM

## 2024-08-30 DIAGNOSIS — Z5900 Homelessness unspecified: Secondary | ICD-10-CM

## 2024-08-30 DIAGNOSIS — Z833 Family history of diabetes mellitus: Secondary | ICD-10-CM

## 2024-08-30 DIAGNOSIS — I480 Paroxysmal atrial fibrillation: Secondary | ICD-10-CM | POA: Diagnosis present

## 2024-08-30 DIAGNOSIS — Z8249 Family history of ischemic heart disease and other diseases of the circulatory system: Secondary | ICD-10-CM

## 2024-08-30 DIAGNOSIS — Z888 Allergy status to other drugs, medicaments and biological substances status: Secondary | ICD-10-CM

## 2024-08-30 DIAGNOSIS — I96 Gangrene, not elsewhere classified: Principal | ICD-10-CM

## 2024-08-30 DIAGNOSIS — E785 Hyperlipidemia, unspecified: Secondary | ICD-10-CM | POA: Diagnosis present

## 2024-08-30 DIAGNOSIS — Z79899 Other long term (current) drug therapy: Secondary | ICD-10-CM

## 2024-08-30 DIAGNOSIS — F17210 Nicotine dependence, cigarettes, uncomplicated: Secondary | ICD-10-CM | POA: Diagnosis present

## 2024-08-30 DIAGNOSIS — Y835 Amputation of limb(s) as the cause of abnormal reaction of the patient, or of later complication, without mention of misadventure at the time of the procedure: Secondary | ICD-10-CM | POA: Diagnosis present

## 2024-08-30 DIAGNOSIS — R197 Diarrhea, unspecified: Secondary | ICD-10-CM | POA: Diagnosis present

## 2024-08-30 DIAGNOSIS — L03119 Cellulitis of unspecified part of limb: Secondary | ICD-10-CM | POA: Diagnosis present

## 2024-08-30 DIAGNOSIS — T8744 Infection of amputation stump, left lower extremity: Principal | ICD-10-CM | POA: Diagnosis present

## 2024-08-30 DIAGNOSIS — Z89432 Acquired absence of left foot: Secondary | ICD-10-CM

## 2024-08-30 DIAGNOSIS — L03116 Cellulitis of left lower limb: Secondary | ICD-10-CM | POA: Diagnosis present

## 2024-08-30 DIAGNOSIS — E1151 Type 2 diabetes mellitus with diabetic peripheral angiopathy without gangrene: Secondary | ICD-10-CM

## 2024-08-30 LAB — CBC
HCT: 33.3 % — ABNORMAL LOW (ref 39.0–52.0)
Hemoglobin: 11.6 g/dL — ABNORMAL LOW (ref 13.0–17.0)
MCH: 32.7 pg (ref 26.0–34.0)
MCHC: 34.8 g/dL (ref 30.0–36.0)
MCV: 93.8 fL (ref 80.0–100.0)
Platelets: 463 K/uL — ABNORMAL HIGH (ref 150–400)
RBC: 3.55 MIL/uL — ABNORMAL LOW (ref 4.22–5.81)
RDW: 13.9 % (ref 11.5–15.5)
WBC: 14.1 K/uL — ABNORMAL HIGH (ref 4.0–10.5)
nRBC: 0 % (ref 0.0–0.2)

## 2024-08-30 LAB — BASIC METABOLIC PANEL WITH GFR
Anion gap: 11 (ref 5–15)
BUN: 14 mg/dL (ref 8–23)
CO2: 28 mmol/L (ref 22–32)
Calcium: 8.6 mg/dL — ABNORMAL LOW (ref 8.9–10.3)
Chloride: 101 mmol/L (ref 98–111)
Creatinine, Ser: 0.87 mg/dL (ref 0.61–1.24)
GFR, Estimated: >60 mL/min
Glucose, Bld: 263 mg/dL — ABNORMAL HIGH (ref 70–99)
Potassium: 3.9 mmol/L (ref 3.5–5.1)
Sodium: 140 mmol/L (ref 135–145)

## 2024-08-30 LAB — LACTIC ACID, PLASMA: Lactic Acid, Venous: 1.8 mmol/L (ref 0.5–1.9)

## 2024-08-30 MED ORDER — OXYCODONE-ACETAMINOPHEN 5-325 MG PO TABS
ORAL_TABLET | ORAL | Status: AC
  Filled 2024-08-30: qty 1

## 2024-08-30 MED ORDER — OXYCODONE-ACETAMINOPHEN 5-325 MG PO TABS
1.0000 | ORAL_TABLET | Freq: Once | ORAL | Status: AC
  Administered 2024-08-30: 1 via ORAL

## 2024-08-30 NOTE — ED Triage Notes (Signed)
 Pt brought in via ems from home.  Pt had right toes amputated last week at [facility].  Pt reports recent falls and came back for eval.    Pt did not go rehab.  Pt alert  speech clear.

## 2024-08-30 NOTE — ED Triage Notes (Signed)
 Arrived by [facility] from home. Patient had two toes amputated and stomach surgery last Saturday 08/27/24. Has had two falls at home.  Denies LOC. Denies hitting head  EMS vitals: 120/64 b/p 80HR 98% RA 126CBG

## 2024-08-30 NOTE — ED Provider Notes (Signed)
 "  [facility] Provider Note    Event Date/Time   First MD Initiated Contact with Patient 08/30/24 2036     (approximate)   History   Fall   HPI  [name] is a 69 y.o. male with a past medical history of hypertension, status post left metatarsal amputation 1 week ago, status post heart catheterization 1 week ago, presenting to the emergency department via EMS due to failure to thrive.  Patient reports that he has been unable to take care of himself at home.  He states that he was not provided with discharge instructions or any wound care.  He states that he has had 2 falls at home since discharge.  He spoke with his doctor who told him to return to the emergency department for SNF placement.     Physical Exam   Triage Vital Signs: ED Triage Vitals  Encounter Vitals Group     BP 08/30/24 1849 (!) 162/63     Girls Systolic BP Percentile --      Girls Diastolic BP Percentile --      Boys Systolic BP Percentile --      Boys Diastolic BP Percentile --      Pulse Rate 08/30/24 1849 77     Resp 08/30/24 1849 18     Temp 08/30/24 1849 98.7 F (37.1 C)     Temp Source 08/30/24 1849 Oral     SpO2 08/30/24 1849 99 %     Weight 08/30/24 1848 150 lb (68 kg)     Height 08/30/24 1848 5' 8 (1.727 m)     Head Circumference --      Peak Flow --      Pain Score 08/30/24 1856 4     Pain Loc --      Pain Education --      Exclude from Growth Chart --     Most recent vital signs: Vitals:   08/30/24 2130 08/30/24 2200  BP: (!) 155/63 (!) 167/65  Pulse: 74 79  Resp:    Temp:    SpO2: 100% 100%     General: Awake, no distress.  CV:  Good peripheral perfusion.  Resp:  Normal effort.  Abd:  No distention.  Other:  Left foot with well-healing surgical incision, no drainage, no erythema; left groin with well-healing incision, no erythema, no drainage, no swelling   ED Results / Procedures / Treatments   Labs (all labs ordered are listed, but only  abnormal results are displayed) Labs Reviewed  BASIC METABOLIC PANEL WITH GFR - Abnormal; Notable for the following components:      Result Value   Glucose, Bld 263 (*)    Calcium  8.6 (*)    All other components within normal limits  CBC - Abnormal; Notable for the following components:   WBC 14.1 (*)    RBC 3.55 (*)    Hemoglobin 11.6 (*)    HCT 33.3 (*)    Platelets 463 (*)    All other components within normal limits  LACTIC ACID, PLASMA     EKG     RADIOLOGY I, Reche CHRISTELLA Leventhal, personally viewed and evaluated these images (plain radiographs) as part of my medical decision making, as well as reviewing the written report by the radiologist.  Left Foot XR: IMPRESSION: 1. 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    PROCEDURES:  Critical Care performed:  No  Procedures   MEDICATIONS ORDERED IN ED: Medications  oxyCODONE -acetaminophen  (PERCOCET/ROXICET) 5-325 MG per tablet 1 tablet (has no administration in time range)     IMPRESSION / MDM / ASSESSMENT AND PLAN / ED COURSE  I reviewed the triage vital signs and the nursing notes.                               Patient's presentation is most consistent with acute illness / injury with system symptoms.  Patient is a 69 year old male with a past medical history of hypertension, status post left metatarsal amputation 1 week ago, status post heart catheterization 1 week ago, presenting to the emergency department via EMS due to failure to thrive.  Review of patient's chart does show a note that he was told by his doctor to return to the emergency department for failure to thrive for rehab/SNF placement.  Lab work was collected.  The patient does have an elevated white blood cell count however the wound itself does appear to be well-healing.  Patient denies fevers, sweats, chills, or increased pain.  TOC consult placed.  Patient will  board in the emergency department until placement is found.      FINAL CLINICAL IMPRESSION(S) / ED DIAGNOSES   Final diagnoses:  Failure to thrive in adult  Fall, initial encounter     Rx / DC Orders   ED Discharge Orders     None        Note:  This document was prepared using Dragon voice recognition software and may include unintentional dictation errors.   Rexford Reche HERO, MD 08/30/24 2219  "

## 2024-08-31 ENCOUNTER — Encounter: Payer: Self-pay | Admitting: Vascular Surgery

## 2024-08-31 LAB — CBG MONITORING, ED
Glucose-Capillary: 108 mg/dL — ABNORMAL HIGH (ref 70–99)
Glucose-Capillary: 53 mg/dL — ABNORMAL LOW (ref 70–99)
Glucose-Capillary: 135 mg/dL — ABNORMAL HIGH (ref 70–99)
Glucose-Capillary: 232 mg/dL — ABNORMAL HIGH (ref 70–99)

## 2024-08-31 MED ORDER — APIXABAN 5 MG PO TABS
5.0000 mg | ORAL_TABLET | Freq: Two times a day (BID) | ORAL | Status: DC
  Administered 2024-08-31 – 2024-09-01 (×4): 5 mg via ORAL
  Filled 2024-08-31 (×4): qty 1

## 2024-08-31 MED ORDER — ATORVASTATIN CALCIUM 20 MG PO TABS
40.0000 mg | ORAL_TABLET | Freq: Every day | ORAL | Status: DC
  Administered 2024-08-31 – 2024-09-01 (×2): 40 mg via ORAL
  Filled 2024-08-31 (×2): qty 2

## 2024-08-31 MED ORDER — CITALOPRAM HYDROBROMIDE 20 MG PO TABS
20.0000 mg | ORAL_TABLET | Freq: Every day | ORAL | Status: DC
  Administered 2024-08-31 – 2024-09-01 (×2): 20 mg via ORAL
  Filled 2024-08-31 (×2): qty 1

## 2024-08-31 MED ORDER — AMIODARONE HCL 200 MG PO TABS
200.0000 mg | ORAL_TABLET | Freq: Every day | ORAL | Status: DC
  Administered 2024-08-31 – 2024-09-01 (×2): 200 mg via ORAL
  Filled 2024-08-31 (×2): qty 1

## 2024-08-31 MED ORDER — OXYCODONE-ACETAMINOPHEN 5-325 MG PO TABS
1.0000 | ORAL_TABLET | Freq: Once | ORAL | Status: AC
  Administered 2024-08-31: 1 via ORAL
  Filled 2024-08-31: qty 1

## 2024-08-31 MED ORDER — ASPIRIN 81 MG PO CHEW
81.0000 mg | CHEWABLE_TABLET | Freq: Every day | ORAL | Status: DC
  Administered 2024-08-31 – 2024-09-01 (×2): 81 mg via ORAL
  Filled 2024-08-31 (×2): qty 1

## 2024-08-31 MED ORDER — SPIRONOLACTONE 25 MG PO TABS
25.0000 mg | ORAL_TABLET | Freq: Every day | ORAL | Status: DC
  Administered 2024-08-31 – 2024-09-01 (×2): 25 mg via ORAL
  Filled 2024-08-31 (×2): qty 1

## 2024-08-31 MED ORDER — PANTOPRAZOLE SODIUM 40 MG PO TBEC
40.0000 mg | DELAYED_RELEASE_TABLET | Freq: Every day | ORAL | Status: DC
  Administered 2024-08-31 – 2024-09-01 (×2): 40 mg via ORAL
  Filled 2024-08-31 (×2): qty 1

## 2024-08-31 MED ORDER — INSULIN GLARGINE 100 UNIT/ML ~~LOC~~ SOLN
22.0000 [IU] | Freq: Every day | SUBCUTANEOUS | Status: DC
  Filled 2024-08-31: qty 0.22

## 2024-08-31 MED ORDER — INSULIN ASPART 100 UNIT/ML IJ SOLN
0.0000 [IU] | Freq: Every day | INTRAMUSCULAR | Status: DC
  Administered 2024-08-31: 2 [IU] via SUBCUTANEOUS
  Administered 2024-09-02: 3 [IU] via SUBCUTANEOUS
  Administered 2024-09-03: 4 [IU] via SUBCUTANEOUS
  Administered 2024-09-04: 2 [IU] via SUBCUTANEOUS
  Filled 2024-08-31: qty 4
  Filled 2024-08-31: qty 2
  Filled 2024-08-31: qty 3
  Filled 2024-08-31: qty 2

## 2024-08-31 MED ORDER — IRBESARTAN 150 MG PO TABS
300.0000 mg | ORAL_TABLET | Freq: Every day | ORAL | Status: DC
  Administered 2024-08-31 – 2024-09-01 (×2): 300 mg via ORAL
  Filled 2024-08-31 (×2): qty 2

## 2024-08-31 MED ORDER — INSULIN ASPART 100 UNIT/ML IJ SOLN
0.0000 [IU] | Freq: Three times a day (TID) | INTRAMUSCULAR | Status: DC
  Administered 2024-09-01: 2 [IU] via SUBCUTANEOUS
  Administered 2024-09-01: 5 [IU] via SUBCUTANEOUS
  Administered 2024-09-02 (×2): 2 [IU] via SUBCUTANEOUS
  Administered 2024-09-02 – 2024-09-03 (×2): 3 [IU] via SUBCUTANEOUS
  Administered 2024-09-03: 2 [IU] via SUBCUTANEOUS
  Administered 2024-09-04: 5 [IU] via SUBCUTANEOUS
  Administered 2024-09-04 (×2): 3 [IU] via SUBCUTANEOUS
  Administered 2024-09-05 (×2): 1 [IU] via SUBCUTANEOUS
  Administered 2024-09-06: 3 [IU] via SUBCUTANEOUS
  Filled 2024-08-31: qty 2
  Filled 2024-08-31: qty 5
  Filled 2024-08-31: qty 1
  Filled 2024-08-31: qty 2
  Filled 2024-08-31 (×3): qty 3
  Filled 2024-08-31: qty 1
  Filled 2024-08-31: qty 3
  Filled 2024-08-31: qty 9
  Filled 2024-08-31: qty 5
  Filled 2024-08-31 (×2): qty 2

## 2024-08-31 MED ORDER — FUROSEMIDE 40 MG PO TABS
40.0000 mg | ORAL_TABLET | Freq: Once | ORAL | Status: AC
  Administered 2024-08-31: 40 mg via ORAL
  Filled 2024-08-31: qty 1

## 2024-08-31 NOTE — Inpatient Diabetes Management (Signed)
 Inpatient Diabetes Program Recommendations  AACE/ADA: New Consensus Statement on Inpatient Glycemic Control   Target Ranges:  Prepandial:   less than 140 mg/dL      Peak postprandial:   less than 180 mg/dL (1-2 hours)      Critically ill patients:  140 - 180 mg/dL    Latest Reference Range & Units 08/31/24 01:41 08/31/24 08:04  Glucose-Capillary 70 - 99 mg/dL 891 (H) 53 (L)    Latest Reference Range & Units 08/30/24 19:00  Glucose 70 - 99 mg/dL 736 (H)   Review of Glycemic Control  Diabetes history: DM2 Outpatient Diabetes medications: Toujeo  40 units QAM, Dexcom G7 Current orders for Inpatient glycemic control: Lantus  22 units daily, Novolog  0-9 units TID with meals, Novolog  0-5 units QHS  Inpatient Diabetes Program Recommendations:    Insulin : CBG 53 mg/dl at 1:95 am today. May want to consider discontinuing Lantus  for now and adding back if CBGs become consistently over 180 mg/dl with Novolog  correction.  NOTE: Patient in ED after falls at home and unable to care for himself. Patient was recently inpatient 08/11/24-08/22/24 and refused SNF. Patient was ordered Semglee  22 units daily, Novolog  4 units TID with meals, Novolog  0-6 units TID with meals, and Novolog  0-5 units at bedtime as inpatient prior to discharge.   Thanks, Earnie Gainer, RN, MSN, CDCES Diabetes Coordinator Inpatient Diabetes Program [phone] (Team Pager from 8am to 5pm)

## 2024-08-31 NOTE — ED Notes (Signed)
 Patient given sandwich and diet soda per request. Patient appreciative. NAD noted.

## 2024-08-31 NOTE — ED Notes (Signed)
 Patient resting quietly. Offers no complaints. Po fluids provided per request

## 2024-08-31 NOTE — ED Provider Notes (Addendum)
 Emergency Medicine Observation Re-evaluation Note  [name] is a 69 y.o. male, seen on rounds today.  Pt initially presented to the ED for complaints of Fall  Was discharged from the hospital but his physician sent him in for SNF placement at rehab.  Reordered his home meds, add insulin  sliding scale.  Will continue to monitor, no acute events.  Pending social work/TOC dispo.   Waymond Lorelle Cummins, MD 08/31/24 0126    Waymond Lorelle Cummins, MD 08/31/24 [phone]

## 2024-09-01 ENCOUNTER — Observation Stay

## 2024-09-01 DIAGNOSIS — Z7901 Long term (current) use of anticoagulants: Secondary | ICD-10-CM | POA: Diagnosis not present

## 2024-09-01 DIAGNOSIS — E1151 Type 2 diabetes mellitus with diabetic peripheral angiopathy without gangrene: Secondary | ICD-10-CM | POA: Diagnosis not present

## 2024-09-01 DIAGNOSIS — Z801 Family history of malignant neoplasm of trachea, bronchus and lung: Secondary | ICD-10-CM | POA: Diagnosis not present

## 2024-09-01 DIAGNOSIS — Z5900 Homelessness unspecified: Secondary | ICD-10-CM | POA: Diagnosis not present

## 2024-09-01 DIAGNOSIS — R296 Repeated falls: Secondary | ICD-10-CM | POA: Diagnosis present

## 2024-09-01 DIAGNOSIS — K219 Gastro-esophageal reflux disease without esophagitis: Secondary | ICD-10-CM | POA: Diagnosis present

## 2024-09-01 DIAGNOSIS — R197 Diarrhea, unspecified: Secondary | ICD-10-CM | POA: Diagnosis not present

## 2024-09-01 DIAGNOSIS — L03119 Cellulitis of unspecified part of limb: Secondary | ICD-10-CM | POA: Diagnosis present

## 2024-09-01 DIAGNOSIS — E1140 Type 2 diabetes mellitus with diabetic neuropathy, unspecified: Secondary | ICD-10-CM | POA: Diagnosis present

## 2024-09-01 DIAGNOSIS — E1152 Type 2 diabetes mellitus with diabetic peripheral angiopathy with gangrene: Secondary | ICD-10-CM | POA: Diagnosis present

## 2024-09-01 DIAGNOSIS — Z888 Allergy status to other drugs, medicaments and biological substances status: Secondary | ICD-10-CM | POA: Diagnosis not present

## 2024-09-01 DIAGNOSIS — I110 Hypertensive heart disease with heart failure: Secondary | ICD-10-CM | POA: Diagnosis present

## 2024-09-01 DIAGNOSIS — R627 Adult failure to thrive: Secondary | ICD-10-CM | POA: Diagnosis present

## 2024-09-01 DIAGNOSIS — I96 Gangrene, not elsewhere classified: Secondary | ICD-10-CM | POA: Diagnosis not present

## 2024-09-01 DIAGNOSIS — Z8551 Personal history of malignant neoplasm of bladder: Secondary | ICD-10-CM | POA: Diagnosis not present

## 2024-09-01 DIAGNOSIS — I5022 Chronic systolic (congestive) heart failure: Secondary | ICD-10-CM | POA: Diagnosis present

## 2024-09-01 DIAGNOSIS — F17210 Nicotine dependence, cigarettes, uncomplicated: Secondary | ICD-10-CM | POA: Diagnosis present

## 2024-09-01 DIAGNOSIS — I480 Paroxysmal atrial fibrillation: Secondary | ICD-10-CM | POA: Diagnosis present

## 2024-09-01 DIAGNOSIS — T8744 Infection of amputation stump, left lower extremity: Secondary | ICD-10-CM | POA: Diagnosis present

## 2024-09-01 DIAGNOSIS — Y835 Amputation of limb(s) as the cause of abnormal reaction of the patient, or of later complication, without mention of misadventure at the time of the procedure: Secondary | ICD-10-CM | POA: Diagnosis present

## 2024-09-01 DIAGNOSIS — L03116 Cellulitis of left lower limb: Secondary | ICD-10-CM | POA: Diagnosis present

## 2024-09-01 DIAGNOSIS — I739 Peripheral vascular disease, unspecified: Secondary | ICD-10-CM | POA: Diagnosis not present

## 2024-09-01 DIAGNOSIS — Z833 Family history of diabetes mellitus: Secondary | ICD-10-CM | POA: Diagnosis not present

## 2024-09-01 DIAGNOSIS — I2510 Atherosclerotic heart disease of native coronary artery without angina pectoris: Secondary | ICD-10-CM | POA: Diagnosis present

## 2024-09-01 DIAGNOSIS — Z7982 Long term (current) use of aspirin: Secondary | ICD-10-CM | POA: Diagnosis not present

## 2024-09-01 DIAGNOSIS — E785 Hyperlipidemia, unspecified: Secondary | ICD-10-CM | POA: Diagnosis present

## 2024-09-01 DIAGNOSIS — I255 Ischemic cardiomyopathy: Secondary | ICD-10-CM | POA: Diagnosis present

## 2024-09-01 DIAGNOSIS — F17290 Nicotine dependence, other tobacco product, uncomplicated: Secondary | ICD-10-CM | POA: Diagnosis present

## 2024-09-01 DIAGNOSIS — Z794 Long term (current) use of insulin: Secondary | ICD-10-CM | POA: Diagnosis not present

## 2024-09-01 DIAGNOSIS — Z8249 Family history of ischemic heart disease and other diseases of the circulatory system: Secondary | ICD-10-CM | POA: Diagnosis not present

## 2024-09-01 DIAGNOSIS — W19XXXA Unspecified fall, initial encounter: Secondary | ICD-10-CM | POA: Diagnosis not present

## 2024-09-01 LAB — CBC WITH DIFFERENTIAL/PLATELET
Abs Immature Granulocytes: 0.06 K/uL (ref 0.00–0.07)
Basophils Absolute: 0.1 K/uL (ref 0.0–0.1)
Basophils Relative: 1 %
Eosinophils Absolute: 0.3 K/uL (ref 0.0–0.5)
Eosinophils Relative: 2 %
HCT: 30.9 % — ABNORMAL LOW (ref 39.0–52.0)
Hemoglobin: 10.4 g/dL — ABNORMAL LOW (ref 13.0–17.0)
Immature Granulocytes: 1 %
Lymphocytes Relative: 15 %
Lymphs Abs: 2 K/uL (ref 0.7–4.0)
MCH: 31.9 pg (ref 26.0–34.0)
MCHC: 33.7 g/dL (ref 30.0–36.0)
MCV: 94.8 fL (ref 80.0–100.0)
Monocytes Absolute: 1 K/uL (ref 0.1–1.0)
Monocytes Relative: 7 %
Neutro Abs: 9.9 K/uL — ABNORMAL HIGH (ref 1.7–7.7)
Neutrophils Relative %: 74 %
Platelets: 355 K/uL (ref 150–400)
RBC: 3.26 MIL/uL — ABNORMAL LOW (ref 4.22–5.81)
RDW: 14 % (ref 11.5–15.5)
WBC: 13.3 K/uL — ABNORMAL HIGH (ref 4.0–10.5)
nRBC: 0 % (ref 0.0–0.2)

## 2024-09-01 LAB — GLUCOSE, CAPILLARY
Glucose-Capillary: 69 mg/dL — ABNORMAL LOW (ref 70–99)
Glucose-Capillary: 270 mg/dL — ABNORMAL HIGH (ref 70–99)
Glucose-Capillary: 209 mg/dL — ABNORMAL HIGH (ref 70–99)
Glucose-Capillary: 162 mg/dL — ABNORMAL HIGH (ref 70–99)

## 2024-09-01 LAB — LACTIC ACID, PLASMA
Lactic Acid, Venous: 1.8 mmol/L (ref 0.5–1.9)
Lactic Acid, Venous: 1.9 mmol/L (ref 0.5–1.9)

## 2024-09-01 LAB — BASIC METABOLIC PANEL WITH GFR
Anion gap: 9 (ref 5–15)
BUN: 12 mg/dL (ref 8–23)
CO2: 27 mmol/L (ref 22–32)
Calcium: 8.7 mg/dL — ABNORMAL LOW (ref 8.9–10.3)
Chloride: 102 mmol/L (ref 98–111)
Creatinine, Ser: 0.87 mg/dL (ref 0.61–1.24)
GFR, Estimated: >60 mL/min
Glucose, Bld: 161 mg/dL — ABNORMAL HIGH (ref 70–99)
Potassium: 3.7 mmol/L (ref 3.5–5.1)
Sodium: 138 mmol/L (ref 135–145)

## 2024-09-01 LAB — CBG MONITORING, ED
Glucose-Capillary: 90 mg/dL (ref 70–99)
Glucose-Capillary: 173 mg/dL — ABNORMAL HIGH (ref 70–99)

## 2024-09-01 LAB — PROCALCITONIN: Procalcitonin: <0.1 ng/mL

## 2024-09-01 MED ORDER — INSULIN GLARGINE 100 UNIT/ML ~~LOC~~ SOLN
20.0000 [IU] | Freq: Every morning | SUBCUTANEOUS | Status: DC
  Filled 2024-09-01: qty 0.2

## 2024-09-01 MED ORDER — ALBUTEROL SULFATE (2.5 MG/3ML) 0.083% IN NEBU: 2.5000 mg | INHALATION_SOLUTION | RESPIRATORY_TRACT | Status: DC | PRN

## 2024-09-01 MED ORDER — IRBESARTAN 150 MG PO TABS
300.0000 mg | ORAL_TABLET | Freq: Every day | ORAL | Status: DC
  Administered 2024-09-02 – 2024-09-06 (×4): 300 mg via ORAL
  Filled 2024-09-01 (×4): qty 2

## 2024-09-01 MED ORDER — NEPRO/CARBSTEADY PO LIQD
237.0000 mL | Freq: Three times a day (TID) | ORAL | Status: DC
  Administered 2024-09-01 – 2024-09-06 (×12): 237 mL via ORAL

## 2024-09-01 MED ORDER — GADOBUTROL 1 MMOL/ML IV SOLN
7.0000 mL | Freq: Once | INTRAVENOUS | Status: AC | PRN
  Administered 2024-09-01: 7 mL via INTRAVENOUS

## 2024-09-01 MED ORDER — ASPIRIN 81 MG PO TBEC
81.0000 mg | DELAYED_RELEASE_TABLET | Freq: Every day | ORAL | Status: DC
  Administered 2024-09-02 – 2024-09-06 (×4): 81 mg via ORAL
  Filled 2024-09-01 (×4): qty 1

## 2024-09-01 MED ORDER — INSULIN GLARGINE 100 UNIT/ML ~~LOC~~ SOLN: 15.0000 [IU] | Freq: Every morning | SUBCUTANEOUS | Status: DC

## 2024-09-01 MED ORDER — SODIUM CHLORIDE 0.9 % IV SOLN
3.0000 g | Freq: Four times a day (QID) | INTRAVENOUS | Status: DC
  Administered 2024-09-01: 3 g via INTRAVENOUS
  Filled 2024-09-01 (×2): qty 8

## 2024-09-01 MED ORDER — SODIUM CHLORIDE 0.9 % IV SOLN: 3.0000 g | Freq: Four times a day (QID) | INTRAVENOUS | Status: DC

## 2024-09-01 MED ORDER — SENNOSIDES-DOCUSATE SODIUM 8.6-50 MG PO TABS: 1.0000 | ORAL_TABLET | Freq: Every evening | ORAL | Status: DC | PRN

## 2024-09-01 MED ORDER — ACETAMINOPHEN 325 MG PO TABS: 650.0000 mg | ORAL_TABLET | Freq: Four times a day (QID) | ORAL | Status: DC | PRN

## 2024-09-01 MED ORDER — SM DOUBLE ANTIBIOTIC 500-10000 UNIT/GM EX OINT
1.0000 | TOPICAL_OINTMENT | Freq: Two times a day (BID) | CUTANEOUS | Status: DC
  Administered 2024-09-01 – 2024-09-05 (×8): 1 via TOPICAL
  Filled 2024-09-01: qty 1
  Filled 2024-09-01 (×2): qty 28.4

## 2024-09-01 MED ORDER — VANCOMYCIN HCL 1500 MG/300ML IV SOLN
1500.0000 mg | Freq: Once | INTRAVENOUS | Status: AC
  Administered 2024-09-01: 1500 mg via INTRAVENOUS
  Filled 2024-09-01: qty 300

## 2024-09-01 MED ORDER — LINEZOLID 600 MG/300ML IV SOLN
600.0000 mg | Freq: Two times a day (BID) | INTRAVENOUS | Status: DC
  Administered 2024-09-01 – 2024-09-04 (×7): 600 mg via INTRAVENOUS
  Filled 2024-09-01 (×8): qty 300

## 2024-09-01 MED ORDER — FUROSEMIDE 40 MG PO TABS
40.0000 mg | ORAL_TABLET | Freq: Every day | ORAL | Status: DC
  Administered 2024-09-01 – 2024-09-06 (×5): 40 mg via ORAL
  Filled 2024-09-01 (×5): qty 1

## 2024-09-01 MED ORDER — SODIUM CHLORIDE 0.9 % IV SOLN
1.0000 g | Freq: Three times a day (TID) | INTRAVENOUS | Status: DC
  Administered 2024-09-01 – 2024-09-04 (×8): 1 g via INTRAVENOUS
  Filled 2024-09-01 (×9): qty 20

## 2024-09-01 MED ORDER — HYDROCODONE-ACETAMINOPHEN 5-325 MG PO TABS
1.0000 | ORAL_TABLET | ORAL | Status: DC | PRN
  Administered 2024-09-01 – 2024-09-02 (×2): 1 via ORAL
  Administered 2024-09-03 – 2024-09-04 (×3): 2 via ORAL
  Administered 2024-09-04: 1 via ORAL
  Administered 2024-09-05: 2 via ORAL
  Administered 2024-09-05: 1 via ORAL
  Administered 2024-09-05: 2 via ORAL
  Filled 2024-09-01: qty 1
  Filled 2024-09-01 (×3): qty 2
  Filled 2024-09-01 (×2): qty 1
  Filled 2024-09-01 (×3): qty 2

## 2024-09-01 MED ORDER — DOUBLE ANTIBIOTIC 500-10000 UNIT/GM EX OINT
1.0000 | TOPICAL_OINTMENT | Freq: Two times a day (BID) | CUTANEOUS | Status: DC
  Filled 2024-09-01: qty 28.4

## 2024-09-01 MED ORDER — PANTOPRAZOLE SODIUM 40 MG PO TBEC
40.0000 mg | DELAYED_RELEASE_TABLET | Freq: Every day | ORAL | Status: DC
  Administered 2024-09-02 – 2024-09-06 (×4): 40 mg via ORAL
  Filled 2024-09-01 (×4): qty 1

## 2024-09-01 MED ORDER — ACETAMINOPHEN 650 MG RE SUPP: 650.0000 mg | Freq: Four times a day (QID) | RECTAL | Status: DC | PRN

## 2024-09-01 MED ORDER — ONDANSETRON HCL 4 MG PO TABS: 4.0000 mg | ORAL_TABLET | Freq: Four times a day (QID) | ORAL | Status: DC | PRN

## 2024-09-01 MED ORDER — ENOXAPARIN SODIUM 40 MG/0.4ML IJ SOSY
40.0000 mg | PREFILLED_SYRINGE | INTRAMUSCULAR | Status: DC
  Administered 2024-09-01: 40 mg via SUBCUTANEOUS
  Filled 2024-09-01: qty 0.4

## 2024-09-01 MED ORDER — AMIODARONE HCL 200 MG PO TABS
200.0000 mg | ORAL_TABLET | Freq: Every day | ORAL | Status: DC
  Administered 2024-09-02 – 2024-09-06 (×4): 200 mg via ORAL
  Filled 2024-09-01 (×4): qty 1

## 2024-09-01 MED ORDER — ATORVASTATIN CALCIUM 20 MG PO TABS
40.0000 mg | ORAL_TABLET | Freq: Every day | ORAL | Status: DC
  Administered 2024-09-01 – 2024-09-06 (×5): 40 mg via ORAL
  Filled 2024-09-01 (×5): qty 2

## 2024-09-01 MED ORDER — CITALOPRAM HYDROBROMIDE 20 MG PO TABS
20.0000 mg | ORAL_TABLET | Freq: Every day | ORAL | Status: DC
  Administered 2024-09-02 – 2024-09-06 (×4): 20 mg via ORAL
  Filled 2024-09-01 (×4): qty 1

## 2024-09-01 MED ORDER — ONDANSETRON HCL 4 MG/2ML IJ SOLN: 4.0000 mg | Freq: Four times a day (QID) | INTRAMUSCULAR | Status: DC | PRN

## 2024-09-01 MED ORDER — TRAZODONE HCL 50 MG PO TABS: 25.0000 mg | ORAL_TABLET | Freq: Every evening | ORAL | Status: DC | PRN

## 2024-09-01 MED ORDER — SPIRONOLACTONE 25 MG PO TABS
25.0000 mg | ORAL_TABLET | Freq: Every day | ORAL | Status: DC
  Administered 2024-09-02 – 2024-09-06 (×4): 25 mg via ORAL
  Filled 2024-09-01 (×4): qty 1

## 2024-09-01 NOTE — ED Provider Notes (Addendum)
" °  In brief: This is a patient signed out to me at 7 AM this morning who is currently boarding in our ED since 08/31/2023 recently admitted from 12/31 to 1/12 for left foot gangrene being managed both by vascular surgery and podiatry status post metatarsal amputation.  He has been sitting in our emergency department for the past 48 hours for possible social work for skilled nursing facility.  He states that for the last 48 hours he has noticed increased redness in his surgical sites and vascular access sites.  I do not see any antibiotics written patient is unclear whether he has been receiving any antibiotics as an outpatient    Clinical Course as of 09/01/24 1114  Thu Sep 01, 2024  0940 Case discussed with Dr. Malvin of podiatry and likely nothing more to offer.  Recommends an MRI of the stump.  Case discussed with vascular surgery.  They will consult and round on this patient.  Agrees with initiating antibiotics and bringing the patient into the hospitalist.  Will leave formal recommendations later this afternoon [HD]  1005 Case discussed with hospitalist for admission [HD]    Clinical Course User Index [HD] Nicholaus Rolland BRAVO, MD   On exam Patient in no acute distress.  Appears chronically unwell Images as below        .Critical Care  Performed by: Nicholaus Rolland BRAVO, MD Authorized by: Nicholaus Rolland BRAVO, MD   Critical care provider statement:    Critical care time (minutes):  30   Critical care was necessary to treat or prevent imminent or life-threatening deterioration of the following conditions: Worsening bacteremia, likely secondary to worsening infection of left foot.   Critical care was time spent personally by me on the following activities:  Development of treatment plan with patient or surrogate, discussions with consultants, evaluation of patient's response to treatment, examination of patient, ordering and review of laboratory studies, ordering and review of radiographic  studies, ordering and performing treatments and interventions, pulse oximetry, re-evaluation of patient's condition and review of old charts   Care discussed with: admitting provider      Nicholaus Rolland BRAVO, MD 09/01/24 1007    Nicholaus Rolland BRAVO, MD 09/01/24 1114  "
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
## 2024-09-01 NOTE — ED Provider Notes (Signed)
 Emergency Medicine Observation Re-evaluation Note   BP (!) 145/71 (BP Location: Right Arm)   Pulse 77   Temp 98.5 F (36.9 C) (Oral)   Resp 18   Ht 5' 8 (1.727 m)   Wt 68 kg   SpO2 100%   BMI 22.81 kg/m    ED Course / MDM   No reported events during my shift at the time of this note.   Pt is awaiting dispo from consultants   Ginnie Shams MD    Shams Ginnie, MD 09/01/24 [phone]

## 2024-09-01 NOTE — Consult Note (Signed)
 "  PODIATRY CONSULTATION  NAME SYLVANUS TELFORD MRN [id number] DOB 07-03-1956 DOA 08/30/2024   Reason for consult:  Chief Complaint  Patient presents with   Fall    Attending/Consulting physician: MYRTIS Mana MD  History of present illness: [name] is a 69 y.o. 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   Patient is well-known to me from clinic and prior hospital admissions.  He has history of severe gangrene of the left forefoot status post TMA on 08/18/2024.  I last saw him 08/22/2024 there is concern for some necrotic tissues along the amputation line.  But no frank dehiscence.  I recommended every 3-day Betadine wet-to-dry dressing on the left foot.  Unfortunately patient requested to go home despite our recommendations from responsibility after last admission.  I am not surprised he was not able to function at home given the surgery he had on his left foot.  Unfortunate that he was allowed to go home in the first place however it was his choice and has rehab but I believe was recommended and offered to him but he declined.  He states he has some pain in the left foot.  He had multiple falls at home due to the amputation of the left foot.  Past Medical History:  Diagnosis Date   Adenomatous colon polyp    Allergic rhinitis    At risk for sleep apnea    STOP-BANG= 5   SENT TO PCP 03-14-2014   Bladder cancer (HCC)    CAD (coronary artery disease)    a. 07/2014 low risk MV; b. 07/2019 Cor CTA (FFR): LM 25-49 (nl), LAD mild prox/mid plaque (nl), D1 25-49p(nl w/ abnl FFR of 0.73 in inf branch), LCX/OM1 mild prox/mid plaque (nl), RCA nondominant, minimal Ca2+  plaque (nl), RPDA (mildly abnl @ 0.79)-->Med Rx..   Cataract    surgically removed bilateral   Condyloma acuminatum of penis    Diabetic neuropathy (HCC)    Diastolic dysfunction    a. 05/2019 Echo: EF 60-65%, no rwam, mod LVH, impaired relaxation, nl RV size/fxn, trace MR, triv TR.   Diverticulosis    GERD (gastroesophageal reflux disease)    History of bladder cancer    s/p  turbt  2013/   transitional cell carcinoma--    History of condyloma acuminatum    PERINEAL AREA  W/ RECURRENCY   History of gout    Hyperlipidemia    Hypertension    Hypoxemia 03/09/2024   Lower urinary tract symptoms (LUTS)    PAF (paroxysmal atrial fibrillation) (HCC)    a. 06/2019 Event monitor: PAF <1% burden. Longest 3 mins 36 secs.   Productive cough    PSVT (paroxysmal supraventricular tachycardia)    a. 06/2019 Event monitor: 112 episodes of SVT, longest 21 secs.   PVD (peripheral vascular disease) with claudication    a. 03/2014 LE art duplex: long segment occlusion of mid to distal R SFA; b. 03/2020 ABI: nl left and mildly improved R ABI->med rx.   Renal artery stenosis    Renal artery stenosis    a. 12/2020 Renal art duplex: RRA 1-59%, LRA >60%.   Smokers' cough (HCC)    Type 2 diabetes mellitus with insulin  therapy (HCC)  1992   monitor by  dr ellsion   Wears dentures    upper       Latest Ref Rng & Units 09/01/2024    8:38 AM 08/30/2024    7:00 PM 08/19/2024    5:00 AM  CBC  WBC 4.0 - 10.5 K/uL 13.3  14.1  13.7   Hemoglobin 13.0 - 17.0 g/dL 89.5  88.3  88.9   Hematocrit 39.0 - 52.0 % 30.9  33.3  31.7   Platelets 150 - 400 K/uL 355  463  364        Latest Ref Rng & Units 09/01/2024    9:30 AM 08/30/2024    7:00 PM 08/22/2024    1:25 PM  BMP  Glucose 70 - 99 mg/dL 838  736  659   BUN 8 - 23 mg/dL 12  14  26    Creatinine 0.61 - 1.24 mg/dL 9.12  9.12  8.92   Sodium 135 - 145 mmol/L 138  140  133   Potassium 3.5 - 5.1 mmol/L 3.7  3.9  4.1   Chloride 98 - 111 mmol/L 102  101  95   CO2 22 -  32 mmol/L 27  28  29    Calcium  8.9 - 10.3 mg/dL 8.7  8.6  9.2       Physical Exam: Lower Extremity Exam  Amputation site on the left foot is erythematous along the incision line there is malodor present.  Area of necrotic fibrotic tissues centrally though no gross dehiscence it appears that it is heading that direction.  On the lateral aspect of the incision line there is an area of necrosis that appears stable from prior.  Pain with palpation of the area.  Decreased pulses on the left foot  Sensation diminished to light touch     ASSESSMENT/PLAN OF CARE 69 y.o. male with PMHx significant for  PAD with recent thrombectomy to L SFA and proximal popliteal artery, left foot osteomyelitis status post transmetatarsal amputation on August 18, 2024, IDDM, HTN, nonobstructive CAD, ischemic cardiomyopathy, chronic HFrEF with LVEF 30-35%, PAF on Eliquis , bladder cancer  with infection of left foot TMA site concerning for cellulitis and possible underlying osteomyelitis of the metatarsal stumps as well as necrosis along the incision line  - Unfortunately patient has limited options with regards to salvage on the left foot.  They would include Betadine wet-to-dry dressings and antibiotic therapy which will not achieve source control of potential infection versus definitive source control with more proximal amputation.  I do believe he will ultimately need amputation of the left side.  I discussed this with him and he is not open to that at this point in time.  Therefore recommend we continue with dressing changes and antibiotics.   -Appreciate vascular surgery I discussed the case with Dr. Marea and he will discuss with the patient regarding possible proximal amputation - Continue IV abx broad spectrum pending further culture data - Anticoagulation: Per primary - Wound care: Recommend Betadine wet-to-dry dressing every day to the left foot - WB status: Nonweightbearing to left foot    Thank you for  the consult.  Please contact me directly with any questions or concerns.           Marolyn JULIANNA Honour, DPM [facility]    [name]  [place], [place] [address]                Office [phone]  Fax [phone]     "

## 2024-09-01 NOTE — Inpatient Diabetes Management (Signed)
 Inpatient Diabetes Program Recommendations  AACE/ADA: New Consensus Statement on Inpatient Glycemic Control  Target Ranges:  Prepandial:   less than 140 mg/dL      Peak postprandial:   less than 180 mg/dL (1-2 hours)      Critically ill patients:  140 - 180 mg/dL    Latest Reference Range & Units 08/31/24 01:41 08/31/24 08:04 08/31/24 11:11 08/31/24 21:23 09/01/24 07:10 09/01/24 10:08 09/01/24 12:27  Glucose-Capillary 70 - 99 mg/dL 891 (H) 53 (L) 864 (H) 232 (H) 90 173 (H) 69 (L)   Review of Glycemic Control  Diabetes history: DM2 Outpatient Diabetes medications: Toujeo  40 units QAM, Dexcom G7 Current orders for Inpatient glycemic control: Lantus  15 units daily, Novolog  0-9 units TID with meals, Novolog  0-5 units QHS    Inpatient Diabetes Program Recommendations:    Insulin :  Pt has not received any basal insulin  since arrival to hospital. His CBG was 90 mg/dl this morning and then 826 mg/dl so he received Novolog  2 units for correction then CBG down to 69 at 12:27 today. Would recommend to discontinue Lantus  for now.  Then if CBGs become consistently over 180 mg/dl, could consider adding low dose basal insulin .  Thanks, Earnie Gainer, RN, MSN, CDCES Diabetes Coordinator Inpatient Diabetes Program [phone] (Team Pager from 8am to 5pm)

## 2024-09-01 NOTE — ED Notes (Signed)
 Pt helped ordering breakfast. Pt given a cup of coffee per request.

## 2024-09-01 NOTE — H&P (Signed)
 " History and Physical    [name] FMW:[id number] DOB: 04-Mar-1956 DOA: 08/30/2024  PCP: Norleen Lynwood LELON, MD (Confirm with patient/family/NH records and if not entered, this has to be entered at [facility] point of entry) Patient coming from: Home  I have personally briefly reviewed patient's old medical records in [facility]  Chief Complaint: Worsening of left foot stump infection and frequent falls  HPI: [name] is a 69 y.o. 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  Patient was recently hospitalized 2 weeks ago for left foot infection including cellulitis, he was treated with IV antibiotics and discharged home on 7 days course of Augmentin  and Zyvox .  Patient told me that however he was not able to get his prescription filled due to transportation problems, this Monday, he was able to went to [facility] to pick up his antibiotics.  But at that time he already noticed worsening of left foot infection and decided to come to the hospital instead of taking p.o. antibiotics at home.  He also claimed that he went to see podiatry for the first time after discharge on Monday and was told to come to the ED  I was told I need to go to rehab.  After discharge 10 days ago, patient reports that due to instruction of nonweightbearing on left leg he has been only using a cane to ambulate but he has significant difficulty to use the cane and sustained frequent falls at home.  And since weekend he started to notice worsening of rash and swelling and pain of the left TMA stump.  He denied any fever or chills no chest pain or shortness of breath.  Patient has been staying in the  ED for the last 2 days, initial plan was for patient to be evaluated by PT and social worker for possible discharge to SNF.  Today however there was a concern about the TMA stump on left foot was infected, with the risk of developing into osteomyelitis and podiatry was consulted who recommended MRI of the left foot and IV antibiotics.   Review of Systems: As per HPI otherwise 14 point review of systems negative.    Past Medical History:  Diagnosis Date   Adenomatous colon polyp    Allergic rhinitis    At risk for sleep apnea    STOP-BANG= 5   SENT TO PCP 03-14-2014   Bladder cancer (HCC)    CAD (coronary artery disease)    a. 07/2014 low risk MV; b. 07/2019 Cor CTA (FFR): LM 25-49 (nl), LAD mild prox/mid plaque (nl), D1 25-49p(nl w/ abnl FFR of 0.73 in inf branch), LCX/OM1 mild prox/mid plaque (nl), RCA nondominant, minimal Ca2+ plaque (nl), RPDA (mildly abnl @ 0.79)-->Med Rx..   Cataract    surgically removed bilateral   Condyloma acuminatum of penis    Diabetic neuropathy (HCC)    Diastolic dysfunction    a. 05/2019 Echo: EF 60-65%, no rwam, mod LVH, impaired relaxation, nl RV size/fxn, trace MR, triv TR.   Diverticulosis    GERD (gastroesophageal reflux disease)    History of bladder cancer    s/p  turbt  2013/   transitional cell carcinoma--    History of condyloma acuminatum  PERINEAL AREA  W/ RECURRENCY   History of gout    Hyperlipidemia    Hypertension    Hypoxemia 03/09/2024   Lower urinary tract symptoms (LUTS)    PAF (paroxysmal atrial fibrillation) (HCC)    a. 06/2019 Event monitor: PAF <1% burden. Longest 3 mins 36 secs.   Productive cough    PSVT (paroxysmal supraventricular tachycardia)    a. 06/2019 Event monitor: 112 episodes of SVT, longest 21 secs.   PVD (peripheral vascular disease) with claudication    a. 03/2014 LE art duplex: long segment occlusion of mid to distal R SFA; b. 03/2020 ABI: nl left and mildly improved R ABI->med rx.   Renal artery stenosis     Renal artery stenosis    a. 12/2020 Renal art duplex: RRA 1-59%, LRA >60%.   Smokers' cough (HCC)    Type 2 diabetes mellitus with insulin  therapy [facility]) 1992   monitor by  dr ellsion   Wears dentures    upper    Past Surgical History:  Procedure Laterality Date   ABDOMINAL AORTOGRAM W/LOWER EXTREMITY N/A 02/15/2024   Procedure: ABDOMINAL AORTOGRAM W/LOWER EXTREMITY;  Surgeon: Pearline Norman RAMAN, MD;  Location: [facility] INVASIVE CV LAB;  Service: Cardiovascular;  Laterality: N/A;   ABDOMINAL AORTOGRAM W/LOWER EXTREMITY N/A 08/15/2024   Procedure: ABDOMINAL AORTOGRAM W/LOWER EXTREMITY;  Surgeon: Marea Selinda RAMAN, MD;  Location: [facility] INVASIVE CV LAB;  Service: Cardiovascular;  Laterality: N/A;   AMPUTATION Left 01/02/2024   Procedure: AMPUTATION, FOOT, RAY;  Surgeon: Malvin Marsa FALCON, DPM;  Location: [facility] OR;  Service: Orthopedics/Podiatry;  Laterality: Left;  FIFTH TOE   AMPUTATION TOE Left 02/29/2024   Procedure: AMPUTATION, LEFT GREAT TOE;  Surgeon: Malvin Marsa FALCON, DPM;  Location: [facility] OR;  Service: Orthopedics/Podiatry;  Laterality: Left;  Left Great toe partial amputation,  Debridement Left Heel   AMPUTATION TOE Left 03/29/2024   Procedure: AMPUTATION LEFT GREAT TOE;  Surgeon: Malvin Marsa FALCON, DPM;  Location: [facility] OR;  Service: Orthopedics/Podiatry;  Laterality: Left;  LEFT GREAT TOE   APPLICATION OF CELL SAVER Left 08/17/2024   Procedure: APPLICATION OF CELL SAVER;  Surgeon: Marea Selinda RAMAN, MD;  Location: [facility] ORS;  Service: Vascular;  Laterality: Left;  SFA thrombectomy and stent   AXILLARY HIDRADENITIS EXCISION  1997   CARDIOVASCULAR STRESS TEST  07-24-2014  dr darron grass   Low risk lexiscan  nuclear study with apical thinning and small inferolateral wall infarct at mid & basal level , no ischemia/  normal LVF and wall motion , ef 59%   CATARACT EXTRACTION Left    CATARACT EXTRACTION W/ INTRAOCULAR LENS IMPLANT Right    CO2 LASER APPLICATION N/A 03/20/2014   Procedure: CO2  LASER APPLICATION,PENIS, GROIN, ANUS;  Surgeon: Elspeth KYM Schultze, MD;  Location: [facility];  Service: General;  Laterality: N/A;   CO2 LASER APPLICATION N/A 05/21/2015   Procedure: CO2 LASER APPLICATION;  Surgeon: Mark Ottelin, MD;  Location: [facility];  Service: Urology;  Laterality: N/A;   CONDYLOMA EXCISION/FULGURATION N/A 05/21/2015   Procedure: CONDYLOMA REMOVAL;  Surgeon: Mark Ottelin, MD;  Location: [facility];  Service: Urology;  Laterality: N/A;   CORONARY PRESSURE/FFR STUDY N/A 08/16/2024   Procedure: CORONARY PRESSURE/FFR STUDY;  Surgeon: Mady Bruckner, MD;  Location: [facility] INVASIVE CV LAB;  Service: Cardiovascular;  Laterality: N/A;   ENDARTERECTOMY FEMORAL Left 08/17/2024   Procedure: ENDARTERECTOMY, FEMORAL;  Surgeon: Marea Selinda RAMAN, MD;  Location: [facility] ORS;  Service: Vascular;  Laterality: Left;  HEMORRHOID SURGERY  10/24/2014   Procedure: HEMORRHOIDECTOMY;  Surgeon: Elspeth Schultze, MD;  Location: [facility];  Service: General;;   INCISION AND DRAINAGE ABSCESS Left 10/24/2014   Procedure: INCISION AND DRAINAGE ABSCESS;  Surgeon: Elspeth Schultze, MD;  Location: [facility];  Service: General;  Laterality: Left;   INCISION AND DRAINAGE OF WOUND Left 03/29/2024   Procedure: IRRIGATION AND DEBRIDEMENT PLANTAR FOOT WOUND;  Surgeon: Malvin Marsa FALCON, DPM;  Location: [facility] OR;  Service: Orthopedics/Podiatry;  Laterality: Left;  LEFT GREAT TOE   INGUINAL HIDRADENITIS EXCISION  1998, 1999   INSERTION OF ILIAC STENT Left 08/17/2024   Procedure: INSERTION, STENT, ARTERY, ILIAC;  Surgeon: Marea Selinda RAMAN, MD;  Location: [facility] ORS;  Service: Vascular;  Laterality: Left;  SFA X2   LASER ABLATION CONDOLAMATA N/A 03/20/2014   Procedure: EXAM UNDER ANESTHESIA, REMOVAL/ABLATION OF CONDYLOMATA PENIS,GROINS, ANUS, ANAL CANAL;  Surgeon: Elspeth KYM Schultze, MD;  Location: [facility];  Service: General;  Laterality: N/A;   groin and anus   LASER ABLATION CONDOLAMATA N/A 10/24/2014   Procedure: LASER ABLATION CONDOLAMATA;  Surgeon: Elspeth Schultze, MD;  Location: [facility] [place];  Service: General;  Laterality: N/A;   LASER ABLATION OF PENILE AND PERIANAL WARTS  07-29-2007  Dr. Schultze   LEFT SHOULDER SURGERY  2003   LOWER EXTREMITY ANGIOGRAPHY N/A 01/01/2024   Procedure: Lower Extremity Angiography;  Surgeon: Sheree Penne Bruckner, MD;  Location: [facility] INVASIVE CV LAB;  Service: Vascular;  Laterality: N/A;   LOWER EXTREMITY ANGIOGRAPHY N/A 02/15/2024   Procedure: Lower Extremity Angiography;  Surgeon: Pearline Norman RAMAN, MD;  Location: [facility] INVASIVE CV LAB;  Service: Cardiovascular;  Laterality: N/A;   LOWER EXTREMITY INTERVENTION  01/01/2024   Procedure: LOWER EXTREMITY INTERVENTION;  Surgeon: Sheree Penne Bruckner, MD;  Location: [facility] INVASIVE CV LAB;  Service: Vascular;;   LOWER EXTREMITY INTERVENTION N/A 02/15/2024   Procedure: LOWER EXTREMITY INTERVENTION;  Surgeon: Pearline Norman RAMAN, MD;  Location: [facility] INVASIVE CV LAB;  Service: Cardiovascular;  Laterality: N/A;   MASS EXCISION N/A 10/24/2014   Procedure: EXCISION OF PERINEAL MASS/SINUS;  Surgeon: Elspeth Schultze, MD;  Location: [facility];  Service: General;  Laterality: N/A;   MOHS SURGERY     back   MOHS SURGERY  2017   face   MULTIPLE TOOTH EXTRACTIONS     PERINEAL HIDRADENITIS EXCISION  1998, 1999   RIGHT/LEFT HEART CATH AND CORONARY ANGIOGRAPHY N/A 08/16/2024   Procedure: RIGHT/LEFT HEART CATH AND CORONARY ANGIOGRAPHY;  Surgeon: Mady Bruckner, MD;  Location: [facility] INVASIVE CV LAB;  Service: Cardiovascular;  Laterality: N/A;   THROMBECTOMY FEMORAL ARTERY Left 08/17/2024   Procedure: THROMBECTOMY, ARTERY, FEMORAL;  Surgeon: Marea Selinda RAMAN, MD;  Location: [facility] ORS;  Service: Vascular;  Laterality: Left;   TRANSMETATARSAL AMPUTATION Left 08/18/2024   Procedure: AMPUTATION, FOOT, TRANSMETATARSAL;  Surgeon: Malvin Marsa FALCON, DPM;   Location: [facility] ORS;  Service: Orthopedics/Podiatry;  Laterality: Left;  Left foot TMA   TRANSURETHRAL RESECTION OF BLADDER TUMOR  05/21/2012   Procedure: TRANSURETHRAL RESECTION OF BLADDER TUMOR (TURBT);  Surgeon: Mark C Ottelin, MD;  Location: [facility];  Service: Urology;  Laterality: N/A;        reports that he has been smoking cigars and cigarettes. He started smoking about 50 years ago. He has a 41 pack-year smoking history. He has never used smokeless tobacco. He reports that he does not drink alcohol and does not use drugs.  Allergies[1]  Family History  Problem Relation Age of Onset   Hypertension Mother    Lung cancer Father    Diabetes Maternal Aunt        x 2   Colon cancer Neg Hx    Esophageal cancer Neg Hx    Pancreatic cancer Neg Hx    Prostate cancer Neg Hx    Kidney disease Neg Hx    Liver disease Neg Hx    Rectal cancer Neg Hx    Stomach cancer Neg Hx      Prior to Admission medications  Medication Sig Start Date End Date Taking? Authorizing Provider  albuterol  (VENTOLIN  HFA) 108 (90 Base) MCG/ACT inhaler Inhale 2 puffs into the lungs every 4 (four) hours as needed for wheezing or shortness of breath. 06/11/24  Yes Ula Prentice SAUNDERS, MD  amiodarone  (PACERONE ) 200 MG tablet Take 200 mg by mouth daily. 07/29/24  Yes [provider]  ascorbic acid  (VITAMIN C ) 500 MG tablet Take 1 tablet (500 mg total) by mouth 2 (two) times daily for 10 days. 08/22/24 09/01/24 Yes Cosette Blackwater, MD  aspirin  EC 81 MG tablet Take 1 tablet (81 mg total) by mouth daily. Swallow whole. 08/23/24  Yes Cosette Blackwater, MD  atorvastatin  (LIPITOR ) 40 MG tablet TAKE 1 TABLET BY MOUTH ONCE DAILY 05/31/24  Yes Norleen Lynwood ORN, MD  citalopram  (CELEXA ) 20 MG tablet Take 1 tablet (20 mg total) by mouth daily. 05/31/24  Yes Norleen Lynwood ORN, MD  Continuous Glucose Sensor (DEXCOM G7 SENSOR) MISC Change every 10 days 07/05/24  Yes Shamleffer, Donell Cardinal, MD  ELIQUIS  5 MG TABS tablet  Take 5 mg by mouth 2 (two) times daily. 07/29/24  Yes [provider]  furosemide  (LASIX ) 40 MG tablet Take 40 mg by mouth daily. 07/29/24  Yes [provider]  glucose blood (ACCU-CHEK GUIDE TEST) test strip 1 each by Other route 3 (three) times daily. Use as instructed 04/05/24  Yes Shamleffer, Ibtehal Jaralla, MD  insulin  glargine, 2 Unit Dial , (TOUJEO  MAX SOLOSTAR) 300 UNIT/ML Solostar Pen Inject 40 Units into the skin every morning. 05/31/24  Yes Norleen Lynwood ORN, MD  irbesartan  (AVAPRO ) 300 MG tablet Take 1 tablet (300 mg total) by mouth daily. 05/31/24  Yes Norleen Lynwood ORN, MD  Multiple Vitamin (MULTIVITAMIN WITH MINERALS) TABS tablet Take 1 tablet by mouth daily. 08/23/24 09/22/24 Yes Cosette Blackwater, MD  Nutritional Supplements (FEEDING SUPPLEMENT, NEPRO CARB STEADY,) LIQD Take 237 mLs by mouth 3 (three) times daily between meals. 08/22/24  Yes Cosette Blackwater, MD  pantoprazole  (PROTONIX ) 40 MG tablet Take 1 tablet (40 mg total) by mouth daily. 05/31/24  Yes Norleen Lynwood ORN, MD  spironolactone  (ALDACTONE ) 25 MG tablet Take 25 mg by mouth daily. 07/29/24  Yes [provider]  Blood Glucose Monitoring Suppl (ACCU-CHEK GUIDE) w/Device KIT 1 Device by Does not apply route daily in the afternoon. 04/05/24   Shamleffer, Donell Cardinal, MD    Physical Exam: Vitals:   08/31/24 0323 08/31/24 2000 09/01/24 0417 09/01/24 1108  BP: (!) 147/69 (!) 145/71 (!) 130/55 (!) 149/64  Pulse: 76 77 69 69  Resp: 18 18 16    Temp:  98.5 F (36.9 C) 98.7 F (37.1 C) 99 F (37.2 C)  TempSrc:  Oral Oral Oral  SpO2: 100%  95% 97%  Weight:      Height:        Constitutional: NAD, calm, comfortable Vitals:   08/31/24 0323 08/31/24 2000 09/01/24 0417 09/01/24 1108  BP: (!) 147/69 (!) 145/71 ROLLEN)  130/55 (!) 149/64  Pulse: 76 77 69 69  Resp: 18 18 16    Temp:  98.5 F (36.9 C) 98.7 F (37.1 C) 99 F (37.2 C)  TempSrc:  Oral Oral Oral  SpO2: 100%  95% 97%  Weight:      Height:       Eyes:  PERRL, lids and conjunctivae normal ENMT: Mucous membranes are moist. Posterior pharynx clear of any exudate or lesions.Normal dentition.  Neck: normal, supple, no masses, no thyromegaly Respiratory: clear to auscultation bilaterally, no wheezing, no crackles. Normal respiratory effort. No accessory muscle use.  Cardiovascular: Regular rate and rhythm, no murmurs / rubs / gallops. No extremity edema. 2+ pedal pulses. No carotid bruits.  Abdomen: no tenderness, no masses palpated. No hepatosplenomegaly. Bowel sounds positive.  Musculoskeletal: no clubbing / cyanosis. No joint deformity upper and lower extremities. Good ROM, no contractures. Normal muscle tone.  Skin: Left TMA stump site appeared to be edematous and rash with tenderness.  No significant discharge noticed. Neurologic: CN 2-12 grossly intact. Sensation intact, DTR normal. Strength 5/5 in all 4.  Psychiatric: Normal judgment and insight. Alert and oriented x 3. Normal mood.     Labs on Admission: I have personally reviewed following labs and imaging studies  CBC: Recent Labs  Lab 08/30/24 1900 09/01/24 0838  WBC 14.1* 13.3*  NEUTROABS  --  9.9*  HGB 11.6* 10.4*  HCT 33.3* 30.9*  MCV 93.8 94.8  PLT 463* 355   Basic Metabolic Panel: Recent Labs  Lab 08/30/24 1900 09/01/24 0930  NA 140 138  K 3.9 3.7  CL 101 102  CO2 28 27  GLUCOSE 263* 161*  BUN 14 12  CREATININE 0.87 0.87  CALCIUM  8.6* 8.7*   GFR: Estimated Creatinine Clearance: 79.2 mL/min (by C-G formula based on SCr of 0.87 mg/dL). Liver Function Tests: No results for input(s): AST, ALT, ALKPHOS, BILITOT, PROT, ALBUMIN  in the last 168 hours. No results for input(s): LIPASE, AMYLASE in the last 168 hours. No results for input(s): AMMONIA in the last 168 hours. Coagulation Profile: No results for input(s): INR, PROTIME in the last 168 hours. Cardiac Enzymes: No results for input(s): CKTOTAL, CKMB, CKMBINDEX, TROPONINI in the  last 168 hours. BNP (last 3 results) No results for input(s): PROBNP in the last 8760 hours. HbA1C: No results for input(s): HGBA1C in the last 72 hours. CBG: Recent Labs  Lab 08/31/24 0804 08/31/24 1111 08/31/24 2123 09/01/24 0710 09/01/24 1008  GLUCAP 53* 135* 232* 90 173*   Lipid Profile: No results for input(s): CHOL, HDL, LDLCALC, TRIG, CHOLHDL, LDLDIRECT in the last 72 hours. Thyroid  Function Tests: No results for input(s): TSH, T4TOTAL, FREET4, T3FREE, THYROIDAB in the last 72 hours. Anemia Panel: No results for input(s): VITAMINB12, FOLATE, FERRITIN, TIBC, IRON, RETICCTPCT in the last 72 hours. Urine analysis:    Component Value Date/Time   COLORURINE YELLOW 03/27/2024 2358   APPEARANCEUR CLEAR 03/27/2024 2358   LABSPEC 1.010 03/27/2024 2358   PHURINE 5.0 03/27/2024 2358   GLUCOSEU >=500 (A) 03/27/2024 2358   GLUCOSEU >=1000 (A) 02/22/2024 1454   HGBUR NEGATIVE 03/27/2024 2358   BILIRUBINUR NEGATIVE 03/27/2024 2358   KETONESUR NEGATIVE 03/27/2024 2358   PROTEINUR 30 (A) 03/27/2024 2358   UROBILINOGEN 0.2 02/22/2024 1454   NITRITE NEGATIVE 03/27/2024 2358   LEUKOCYTESUR MODERATE (A) 03/27/2024 2358    Radiological Exams on Admission: DG Chest 1 View Result Date: 09/01/2024 EXAM: 1 VIEW(S) XRAY OF THE CHEST 09/01/2024 11:08:00 AM COMPARISON: 07/13/2024 CLINICAL HISTORY:  Congestive heart failure. ICD10: [id number] Congestive heart failure. FINDINGS: LUNGS AND PLEURA: Persistent trace left pleural effusion with associated left basilar opacity. No pneumothorax. HEART AND MEDIASTINUM: Aortic atherosclerosis. No acute abnormality of the cardiac and mediastinal silhouettes. BONES AND SOFT TISSUES: Old left clavicular fracture. IMPRESSION: 1. Persistent trace left pleural effusion with associated left basilar atelectasis. Electronically signed by: Rogelia Myers MD 09/01/2024 11:29 AM EST RP Workstation: [id number]   DG Foot 2 Views  Left Result Date: 08/30/2024 EXAM: 2 Views XRAY of the Left Foot 08/30/2024 09:15:00 PM COMPARISON: 08/18/2024. CLINICAL HISTORY: Post op pain FINDINGS: Changes of transmetatarsal amputation of all 5 left toes. No change since prior study. No acute bony abnormality. SOFT TISSUES: Unremarkable. Surgical clips along the stump. IMPRESSION: 1. 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 Electronically signed by: Franky Crease MD 08/30/2024 09:31 PM EST RP Workstation: [id number]    EKG: None  Assessment/Plan Principal Problem:   Cellulitis of foot Active Problems:   Cellulitis of left foot  (please populate well all problems here in Problem List. (For example, if patient is on BP meds at home and you resume or decide to hold them, it is a problem that needs to be her. Same for CAD, COPD, HLD and so on)  Worsening of left foot TMA site cellulitis - Secondary to nonadherence to antibiotics. - Review of most recent wound culture showed multiple organisms, plan to continue antibiotic regimen on discharge from last week, Zyvox  and escalate Augmentin  to Unasyn  for now.  MRI is ordered. - Podiatry consulted in ED - Blood culture sent  PAD Status post recent-left common femoral artery, profunda femoris, SFA endarterectomy and patch he angioplasty, mechanical thrombectomy to left SFA and proximal popliteal artery - Local left groin surgical site clean no signs of infection - Continue aspirin  and statin  History of HFrEF Nonobstructive CAD status post recent LHC Ischemic cardiomyopathy - Euvolemic - Continue p.o. Lasix  - Continue ARB and spironolactone   PAF - In sinus rhythm, continue amiodarone  - Hold off Eliquis  in case patient will need surgical intervention.  IDDM - Glucose 164 this morning - Cut down Lantus  to 15 units daily - SSI  Acute ambulatory impairment - Continue nonweightbearing of left foot - PT  evaluation  DVT prophylaxis: Lovenox  Code Status: Full code Family Communication: None at bedside Disposition Plan: Expect less than 2 midnight hospital stay Consults called: Podiatry Dr. Malvin, vascular surgery Dr. Marea Admission status: MedSurg observation   Cort ONEIDA Mana MD [facility] Pager [phone]  09/01/2024, 11:37 AM       [1]  Allergies Allergen Reactions   Cefepime  Hives    Had allergic reaction to one of these 3 agents, unclear which one   Metoprolol  Hives    Had allergic reaction to one of these 3 agents, unclear which one  *Per RN, highly likely this is the cause of allergic rxn   Myrbetriq  [Mirabegron ] Hives    Had allergic reaction to one of these 3 agents, unclear which one   "

## 2024-09-01 NOTE — ED Notes (Signed)
 This RN redressed pts L foot with ointment gauze and wrap. Pt tolerated well.

## 2024-09-01 NOTE — TOC Initial Note (Signed)
 Transition of Care [facility]) - Initial/Assessment Note    Patient Details  Name: [name] MRN: [id number] Date of Birth: 10-20-1955  Transition of Care [facility]) CM/SW Contact:    Nakshatra Klose L Kate Sweetman, LCSW Phone Number: 09/01/2024, 10:11 AM  Clinical Narrative:                  [facility] consult for SNF placement. PT and OT have not evaluated and made recommendations. Per chart review, patient has gangrene on feet and there are concerns regarding patients ability to take care of his wounds.   CSW and CMA II met with patient. Patient advised that the electricity has been turned on in home. Patient advised that DSS is still his payee but he does not agree with that.  Patient has fallen several times at home. Patient advised that he will be admitted. He advised that he is worried about not being home. CSW reviewed options for SNF placement. Patient is agreeing to go to SNF but he does not want to be too far away from home.   FL2 will be completed. Bed search will be initiated.        Patient Goals and CMS Choice            Expected Discharge Plan and Services                                              Prior Living Arrangements/Services                       Activities of Daily Living      Permission Sought/Granted                  Emotional Assessment              Admission diagnosis:  Post Op/Falling Patient Active Problem List   Diagnosis Date Noted   Protein-calorie malnutrition, severe 08/19/2024   HFrEF (heart failure with reduced ejection fraction) (HCC) 08/16/2024   Left foot infection 08/16/2024   Uncontrolled type 2 diabetes mellitus with hypoglycemia, with long-term current use of insulin  (HCC) 08/11/2024   CAD (coronary artery disease)    PAF (paroxysmal atrial fibrillation) (HCC)    Housing instability 06/17/2024   History of amputation of left fifth toe 04/05/2024   Osteomyelitis of toe of left foot (HCC) 03/29/2024   Ulcer of  left foot with necrosis of muscle (HCC) 03/29/2024   Cellulitis of left foot 03/28/2024   Cellulitis 03/28/2024   Gangrene of left foot (HCC) 03/28/2024   Hypoxemia 03/09/2024   Parainfluenza virus infection 03/09/2024   Ulcer of foot, left, with fat layer exposed (HCC) 02/29/2024   Cellulitis in diabetic foot (HCC) 02/28/2024   Osteomyelitis (HCC) 02/27/2024   Cellulitis of great toe of left foot 02/22/2024   Scrotal mass 02/22/2024   Grief 02/22/2024   Enteritis, enteropathogenic E. coli 12/31/2023   Acute osteomyelitis of toe, left (HCC) 12/31/2023   Osteomyelitis of fifth toe of left foot (HCC) 12/30/2023   Uncontrolled type 2 diabetes mellitus with hyperglycemia, with long-term current use of insulin  (HCC) 12/30/2023   Sepsis due to cellulitis (HCC) 12/29/2023   Wheezing 01/13/2023   Right foot pain 04/01/2022   History of colonic polyps 03/12/2022   Gastroenteritis 03/12/2022   Insulin  dependent type 2 diabetes mellitus (HCC) 02/13/2022   Anxiety  05/01/2021   Aortic atherosclerosis 10/25/2020   Vitamin D  deficiency 10/20/2019   Noncompliance with medication regimen - changes himself often 06/28/2019   Elevated troponin    Hypokalemia    Hypomagnesemia    Sepsis (HCC) 06/05/2019   Olecranon bursitis of right elbow 04/21/2019   Chronic left shoulder pain 10/08/2016   Laceration of skin of right lower leg 06/11/2016   Back pain 04/02/2015   Dyslipidemia 01/01/2015   Atypical chest pain 07/18/2014   Encounter for well adult exam with abnormal findings 07/03/2014   PAD (peripheral artery disease) 04/05/2014   Folliculitis of scrotum 04/03/2014   Pain in joint, lower leg 04/03/2014   Edema of posterior shaft of penis 01/31/2014   Bladder cancer (HCC) 05/21/2012   Gross hematuria 03/25/2012   Encounter for long-term (current) use of other medications 02/02/2012   Screening for prostate cancer 02/02/2012   Hepatomegaly 09/10/2010   ABNORMAL ELECTROCARDIOGRAM 09/10/2010    FOOT PAIN, LEFT 05/02/2010   Asthma 07/25/2009   Tobacco abuse 05/22/2009   Cough 05/11/2008   Condylomata of penis, groins, perianal - recurrent 01/17/2008   SHOULDER PAIN, LEFT, CHRONIC 01/17/2008   Essential hypertension 03/06/2007   ALLERGIC RHINITIS 03/06/2007   PCP:  Norleen Lynwood ORN, MD Pharmacy:   [facility] [address] [place] - [address] [facility] [place] [address] Phone: [phone] Fax: [phone]  [facility]) - [place], [place] - [address] [facility] [address] [place] [address] [place] [address] Phone: [phone] Fax: [phone]  [facility] - [place], [place] - [address] [place] [address] Phone: [phone] Fax: [phone]  [facility] [address] [place] [address] Phone: [phone] Fax: [phone]  [facility] [address] [place] [address] Phone: [phone] Fax: [phone]     Social Drivers of Health (SDOH) Social History: SDOH Screenings   Food Insecurity: Food Insecurity Present (08/23/2024)  Housing: High Risk (08/23/2024)  Transportation Needs: Unmet Transportation Needs (08/23/2024)  Utilities: At Risk (08/23/2024)  Alcohol Screen: Low Risk (06/10/2023)  Depression (PHQ2-9): Low Risk (07/05/2024)  Financial Resource Strain: High Risk (03/11/2024)  Physical Activity: Inactive (06/10/2023)  Social Connections: Socially Isolated (08/11/2024)  Stress: Stress Concern Present (03/11/2024)  Tobacco Use: High Risk (08/30/2024)  Health Literacy: Inadequate Health Literacy (02/17/2024)   SDOH Interventions:     Readmission Risk Interventions    03/30/2024    2:55 PM 03/09/2024    3:21 PM 12/30/2023    1:17 PM  Readmission Risk Prevention Plan  Post Dischage Appt   Complete  Medication Screening   Complete  Transportation  Screening Complete Complete Complete  PCP or Specialist Appt within 5-7 Days  Complete   PCP or Specialist Appt within 3-5 Days Complete    Home Care Screening  Complete   Medication Review (RN CM)  Complete   HRI or Home Care Consult Complete    Social Work Consult for Recovery Care Planning/Counseling Complete    Palliative Care Screening Complete    Medication Review Oceanographer) Referral to Pharmacy

## 2024-09-01 NOTE — Progress Notes (Signed)
 SLP Cancellation Note  Patient Details Name: [name] MRN: [id number] DOB: 09-Dec-1955   Cancelled treatment:       Reason Eval/Treat Not Completed: SLP screened, no needs identified, will sign off   Ciela Mahajan 09/01/2024, 4:05 PM

## 2024-09-01 NOTE — NC FL2 (Cosign Needed)
 " [place]  MEDICAID FL2 LEVEL OF CARE FORM     IDENTIFICATION  Patient Name: [name] Birthdate: [birth year] Sex: male Admission Date (Current Location): 08/30/2024  [place] and [place] Number:  Chiropodist and Address:  [facility], [address], [place], [place] [address]      Provider Number: [id number]  Attending Physician Name and Address:  Nicholaus Rolland BRAVO, MD  Relative Name and Phone Number:  Flinchum,Randy  Emergency Contact  [phone]    Current Level of Care: Hospital Recommended Level of Care: Skilled Nursing Facility Prior Approval Number:    Date Approved/Denied:   PASRR Number: [id number]  Discharge Plan: SNF    Current Diagnoses: Patient Active Problem List   Diagnosis Date Noted   Protein-calorie malnutrition, severe 08/19/2024   HFrEF (heart failure with reduced ejection fraction) (HCC) 08/16/2024   Left foot infection 08/16/2024   Uncontrolled type 2 diabetes mellitus with hypoglycemia, with long-term current use of insulin  (HCC) 08/11/2024   CAD (coronary artery disease)    PAF (paroxysmal atrial fibrillation) (HCC)    Housing instability 06/17/2024   History of amputation of left fifth toe 04/05/2024   Osteomyelitis of toe of left foot (HCC) 03/29/2024   Ulcer of left foot with necrosis of muscle (HCC) 03/29/2024   Cellulitis of left foot 03/28/2024   Cellulitis 03/28/2024   Gangrene of left foot (HCC) 03/28/2024   Hypoxemia 03/09/2024   Parainfluenza virus infection 03/09/2024   Ulcer of foot, left, with fat layer exposed (HCC) 02/29/2024   Cellulitis in diabetic foot (HCC) 02/28/2024   Osteomyelitis (HCC) 02/27/2024   Cellulitis of great toe of left foot 02/22/2024   Scrotal mass 02/22/2024   Grief 02/22/2024   Enteritis, enteropathogenic E. coli 12/31/2023   Acute osteomyelitis of toe, left (HCC) 12/31/2023   Osteomyelitis of fifth toe of left foot (HCC) 12/30/2023   Uncontrolled type 2 diabetes  mellitus with hyperglycemia, with long-term current use of insulin  (HCC) 12/30/2023   Sepsis due to cellulitis (HCC) 12/29/2023   Wheezing 01/13/2023   Right foot pain 04/01/2022   History of colonic polyps 03/12/2022   Gastroenteritis 03/12/2022   Insulin  dependent type 2 diabetes mellitus (HCC) 02/13/2022   Anxiety 05/01/2021   Aortic atherosclerosis 10/25/2020   Vitamin D  deficiency 10/20/2019   Noncompliance with medication regimen - changes himself often 06/28/2019   Elevated troponin    Hypokalemia    Hypomagnesemia    Sepsis (HCC) 06/05/2019   Olecranon bursitis of right elbow 04/21/2019   Chronic left shoulder pain 10/08/2016   Laceration of skin of right lower leg 06/11/2016   Back pain 04/02/2015   Dyslipidemia 01/01/2015   Atypical chest pain 07/18/2014   Encounter for well adult exam with abnormal findings 07/03/2014   PAD (peripheral artery disease) 04/05/2014   Folliculitis of scrotum 04/03/2014   Pain in joint, lower leg 04/03/2014   Edema of posterior shaft of penis 01/31/2014   Bladder cancer (HCC) 05/21/2012   Gross hematuria 03/25/2012   Encounter for long-term (current) use of other medications 02/02/2012   Screening for prostate cancer 02/02/2012   Hepatomegaly 09/10/2010   ABNORMAL ELECTROCARDIOGRAM 09/10/2010   FOOT PAIN, LEFT 05/02/2010   Asthma 07/25/2009   Tobacco abuse 05/22/2009   Cough 05/11/2008   Condylomata of penis, groins, perianal - recurrent 01/17/2008   SHOULDER PAIN, LEFT, CHRONIC 01/17/2008   Essential hypertension 03/06/2007   ALLERGIC RHINITIS 03/06/2007    Orientation RESPIRATION BLADDER Height & Weight  Self, Time, Situation, Place  Normal Continent Weight: 150 lb (68 kg) Height:  5' 8 (172.7 cm)  BEHAVIORAL SYMPTOMS/MOOD NEUROLOGICAL BOWEL NUTRITION STATUS      Continent Diet  AMBULATORY STATUS COMMUNICATION OF NEEDS Skin   Extensive Assist Verbally Skin abrasions, Other (Comment) (Toe amputations)                        Personal Care Assistance Level of Assistance  Bathing, Dressing Bathing Assistance: Limited assistance   Dressing Assistance: Limited assistance     Functional Limitations Info             SPECIAL CARE FACTORS FREQUENCY                       Contractures Contractures Info: Not present    Additional Factors Info  Allergies   Allergies Info: Cefepime  High  Hives Had allergic reaction to one of these 3 agents, unclear which one  Metoprolol  High  Hives Had allergic reaction to one of these 3 agents, unclear which one *Per RN, highly likely this is the cause of allergic rxn  Myrbetriq  (mirabegron ) High  Hives Had allergic reaction to one of these 3 agents, unclear which one           Current Medications (09/01/2024):  This is the current hospital active medication list Current Facility-Administered Medications  Medication Dose Route Frequency Provider Last Rate Last Admin   amiodarone  (PACERONE ) tablet 200 mg  200 mg Oral Daily Tan, Ting Xu, MD   200 mg at 09/01/24 9045   apixaban  (ELIQUIS ) tablet 5 mg  5 mg Oral BID Tan, Ting Xu, MD   5 mg at 09/01/24 9045   aspirin  chewable tablet 81 mg  81 mg Oral Daily Tan, Ting Xu, MD   81 mg at 09/01/24 9045   atorvastatin  (LIPITOR ) tablet 40 mg  40 mg Oral Daily Tan, Ting Xu, MD   40 mg at 09/01/24 9045   citalopram  (CELEXA ) tablet 20 mg  20 mg Oral Daily Tan, Ting Xu, MD   20 mg at 09/01/24 9045   insulin  aspart (novoLOG ) injection 0-5 Units  0-5 Units Subcutaneous QHS Tan, Ting Xu, MD   2 Units at 08/31/24 2128   insulin  aspart (novoLOG ) injection 0-9 Units  0-9 Units Subcutaneous TID WC Tan, Ting Xu, MD   2 Units at 09/01/24 1011   irbesartan  (AVAPRO ) tablet 300 mg  300 mg Oral Daily Tan, Ting Xu, MD   300 mg at 09/01/24 9044   pantoprazole  (PROTONIX ) EC tablet 40 mg  40 mg Oral Daily Tan, Ting Xu, MD   40 mg at 09/01/24 0954   SM Double Antibiotic 500-10000 UNIT/GM OINT 1 Application  1 Application Topical BID Niels Kayla FALCON, [facility]   1 Application at 09/01/24 9043   spironolactone  (ALDACTONE ) tablet 25 mg  25 mg Oral Daily Tan, Ting Xu, MD   25 mg at 09/01/24 9046   vancomycin  (VANCOREADY) IVPB 1500 mg/300 mL  1,500 mg Intravenous Once Park, Leonor BROCKS, [facility]       Current Outpatient Medications  Medication Sig Dispense Refill   albuterol  (VENTOLIN  HFA) 108 (90 Base) MCG/ACT inhaler Inhale 2 puffs into the lungs every 4 (four) hours as needed for wheezing or shortness of breath. 1 each 0   amiodarone  (PACERONE ) 200 MG tablet Take 200 mg by mouth daily.     ascorbic acid  (VITAMIN C ) 500 MG tablet Take 1  tablet (500 mg total) by mouth 2 (two) times daily for 10 days. 20 tablet 0   aspirin  EC 81 MG tablet Take 1 tablet (81 mg total) by mouth daily. Swallow whole. 30 tablet 12   atorvastatin  (LIPITOR ) 40 MG tablet TAKE 1 TABLET BY MOUTH ONCE DAILY 90 tablet 3   citalopram  (CELEXA ) 20 MG tablet Take 1 tablet (20 mg total) by mouth daily. 90 tablet 2   Continuous Glucose Sensor (DEXCOM G7 SENSOR) MISC Change every 10 days 9 each 3   ELIQUIS  5 MG TABS tablet Take 5 mg by mouth 2 (two) times daily.     furosemide  (LASIX ) 40 MG tablet Take 40 mg by mouth daily.     glucose blood (ACCU-CHEK GUIDE TEST) test strip 1 each by Other route 3 (three) times daily. Use as instructed 300 each 12   insulin  glargine, 2 Unit Dial , (TOUJEO  MAX SOLOSTAR) 300 UNIT/ML Solostar Pen Inject 40 Units into the skin every morning. 15 mL 3   irbesartan  (AVAPRO ) 300 MG tablet Take 1 tablet (300 mg total) by mouth daily. 90 tablet 3   Multiple Vitamin (MULTIVITAMIN WITH MINERALS) TABS tablet Take 1 tablet by mouth daily. 30 tablet 0   Nutritional Supplements (FEEDING SUPPLEMENT, NEPRO CARB STEADY,) LIQD Take 237 mLs by mouth 3 (three) times daily between meals. 237 mL 0   pantoprazole  (PROTONIX ) 40 MG tablet Take 1 tablet (40 mg total) by mouth daily. 100 tablet 2   spironolactone  (ALDACTONE ) 25 MG tablet Take 25 mg by mouth daily.     Blood  Glucose Monitoring Suppl (ACCU-CHEK GUIDE) w/Device KIT 1 Device by Does not apply route daily in the afternoon. 1 kit 0     Discharge Medications: Please see discharge summary for a list of discharge medications.  Relevant Imaging Results:  Relevant Lab Results:   Additional Information [id number]  [facility] Cherly Erno     "
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
## 2024-09-01 NOTE — Consult Note (Signed)
 ED Pharmacy Antibiotic Sign Off An antibiotic consult was received from an ED provider for osteomyelitis (Recent debridement of foot, worsening erythema throughout) per pharmacy dosing for vancomycin . A chart review was completed to assess appropriateness.   The following one time order(s) were placed:  Vancomycin  1500 mg IV x1  Further antibiotic and/or antibiotic pharmacy consults should be ordered by the admitting provider if indicated.   Thank you for allowing pharmacy to be a part of this patient's care.   Leonor JAYSON Argyle, PharmD Pharmacy Resident  09/01/2024 10:04 AM

## 2024-09-02 DIAGNOSIS — L03116 Cellulitis of left lower limb: Secondary | ICD-10-CM

## 2024-09-02 DIAGNOSIS — R627 Adult failure to thrive: Secondary | ICD-10-CM | POA: Diagnosis not present

## 2024-09-02 DIAGNOSIS — R197 Diarrhea, unspecified: Secondary | ICD-10-CM

## 2024-09-02 DIAGNOSIS — I739 Peripheral vascular disease, unspecified: Secondary | ICD-10-CM | POA: Diagnosis not present

## 2024-09-02 LAB — CBC
HCT: 29.9 % — ABNORMAL LOW (ref 39.0–52.0)
Hemoglobin: 10.1 g/dL — ABNORMAL LOW (ref 13.0–17.0)
MCH: 32.3 pg (ref 26.0–34.0)
MCHC: 33.8 g/dL (ref 30.0–36.0)
MCV: 95.5 fL (ref 80.0–100.0)
Platelets: 302 K/uL (ref 150–400)
RBC: 3.13 MIL/uL — ABNORMAL LOW (ref 4.22–5.81)
RDW: 13.8 % (ref 11.5–15.5)
WBC: 12.7 K/uL — ABNORMAL HIGH (ref 4.0–10.5)
nRBC: 0 % (ref 0.0–0.2)

## 2024-09-02 LAB — BASIC METABOLIC PANEL WITH GFR
Anion gap: 8 (ref 5–15)
BUN: 15 mg/dL (ref 8–23)
CO2: 27 mmol/L (ref 22–32)
Calcium: 8.6 mg/dL — ABNORMAL LOW (ref 8.9–10.3)
Chloride: 103 mmol/L (ref 98–111)
Creatinine, Ser: 0.72 mg/dL (ref 0.61–1.24)
GFR, Estimated: >60 mL/min
Glucose, Bld: 184 mg/dL — ABNORMAL HIGH (ref 70–99)
Potassium: 4.1 mmol/L (ref 3.5–5.1)
Sodium: 138 mmol/L (ref 135–145)

## 2024-09-02 LAB — HEPARIN LEVEL (UNFRACTIONATED)
Heparin Unfractionated: 0.25 [IU]/mL — ABNORMAL LOW (ref 0.30–0.70)
Heparin Unfractionated: 0.17 [IU]/mL — ABNORMAL LOW (ref 0.30–0.70)

## 2024-09-02 LAB — GLUCOSE, CAPILLARY
Glucose-Capillary: 194 mg/dL — ABNORMAL HIGH (ref 70–99)
Glucose-Capillary: 186 mg/dL — ABNORMAL HIGH (ref 70–99)
Glucose-Capillary: 233 mg/dL — ABNORMAL HIGH (ref 70–99)
Glucose-Capillary: 286 mg/dL — ABNORMAL HIGH (ref 70–99)

## 2024-09-02 LAB — APTT: aPTT: 37 s — ABNORMAL HIGH (ref 24–36)

## 2024-09-02 LAB — PROTIME-INR
INR: 1.1 (ref 0.8–1.2)
Prothrombin Time: 14.5 s (ref 11.4–15.2)

## 2024-09-02 MED ORDER — HEPARIN BOLUS VIA INFUSION
4000.0000 [IU] | Freq: Once | INTRAVENOUS | Status: AC
  Administered 2024-09-02: 4000 [IU] via INTRAVENOUS
  Filled 2024-09-02: qty 4000

## 2024-09-02 MED ORDER — HEPARIN BOLUS VIA INFUSION
2000.0000 [IU] | Freq: Once | INTRAVENOUS | Status: AC
  Administered 2024-09-02: 2000 [IU] via INTRAVENOUS
  Filled 2024-09-02: qty 2000

## 2024-09-02 MED ORDER — HEPARIN (PORCINE) 25000 UT/250ML-% IV SOLN
2100.0000 [IU]/h | INTRAVENOUS | Status: DC
  Administered 2024-09-02: 1000 [IU]/h via INTRAVENOUS
  Administered 2024-09-02: 1250 [IU]/h via INTRAVENOUS
  Administered 2024-09-04: 1950 [IU]/h via INTRAVENOUS
  Filled 2024-09-02 (×2): qty 250

## 2024-09-02 NOTE — [place] FL2 (Signed)
 " [place]  MEDICAID FL2 LEVEL OF CARE FORM     IDENTIFICATION  Patient Name: [name] Birthdate: [birth year] Sex: male Admission Date (Current Location): 08/30/2024  [place] and [place] Number:  Chiropodist and Address:  [facility], [address], [place], [place] [address]      Provider Number: [id number]  Attending Physician Name and Address:  Tobie Calix, MD  Relative Name and Phone Number:  Flinchum,Randy  Emergency Contact  [phone]    Current Level of Care: Hospital Recommended Level of Care: Skilled Nursing Facility Prior Approval Number:    Date Approved/Denied:   PASRR Number: [id number]  Discharge Plan: SNF    Current Diagnoses: Patient Active Problem List   Diagnosis Date Noted   Cellulitis of foot 09/01/2024   Protein-calorie malnutrition, severe 08/19/2024   HFrEF (heart failure with reduced ejection fraction) (HCC) 08/16/2024   Left foot infection 08/16/2024   Uncontrolled type 2 diabetes mellitus with hypoglycemia, with long-term current use of insulin  (HCC) 08/11/2024   CAD (coronary artery disease)    PAF (paroxysmal atrial fibrillation) (HCC)    Housing instability 06/17/2024   History of amputation of left fifth toe 04/05/2024   Osteomyelitis of toe of left foot (HCC) 03/29/2024   Ulcer of left foot with necrosis of muscle (HCC) 03/29/2024   Cellulitis of left foot 03/28/2024   Cellulitis 03/28/2024   Gangrene of left foot (HCC) 03/28/2024   Hypoxemia 03/09/2024   Parainfluenza virus infection 03/09/2024   Ulcer of foot, left, with fat layer exposed (HCC) 02/29/2024   Cellulitis in diabetic foot (HCC) 02/28/2024   Osteomyelitis (HCC) 02/27/2024   Cellulitis of great toe of left foot 02/22/2024   Scrotal mass 02/22/2024   Grief 02/22/2024   Enteritis, enteropathogenic E. coli 12/31/2023   Acute osteomyelitis of toe, left (HCC) 12/31/2023   Osteomyelitis of fifth toe of left foot (HCC) 12/30/2023    Uncontrolled type 2 diabetes mellitus with hyperglycemia, with long-term current use of insulin  (HCC) 12/30/2023   Sepsis due to cellulitis (HCC) 12/29/2023   Wheezing 01/13/2023   Right foot pain 04/01/2022   History of colonic polyps 03/12/2022   Gastroenteritis 03/12/2022   Insulin  dependent type 2 diabetes mellitus (HCC) 02/13/2022   Anxiety 05/01/2021   Aortic atherosclerosis 10/25/2020   Vitamin D  deficiency 10/20/2019   Noncompliance with medication regimen - changes himself often 06/28/2019   Elevated troponin    Hypokalemia    Hypomagnesemia    Sepsis (HCC) 06/05/2019   Olecranon bursitis of right elbow 04/21/2019   Chronic left shoulder pain 10/08/2016   Laceration of skin of right lower leg 06/11/2016   Back pain 04/02/2015   Dyslipidemia 01/01/2015   Atypical chest pain 07/18/2014   Encounter for well adult exam with abnormal findings 07/03/2014   PAD (peripheral artery disease) 04/05/2014   Folliculitis of scrotum 04/03/2014   Pain in joint, lower leg 04/03/2014   Edema of posterior shaft of penis 01/31/2014   Bladder cancer (HCC) 05/21/2012   Gross hematuria 03/25/2012   Encounter for long-term (current) use of other medications 02/02/2012   Screening for prostate cancer 02/02/2012   Hepatomegaly 09/10/2010   ABNORMAL ELECTROCARDIOGRAM 09/10/2010   FOOT PAIN, LEFT 05/02/2010   Asthma 07/25/2009   Tobacco abuse 05/22/2009   Cough 05/11/2008   Condylomata of penis, groins, perianal - recurrent 01/17/2008   SHOULDER PAIN, LEFT, CHRONIC 01/17/2008   Essential hypertension 03/06/2007   ALLERGIC RHINITIS 03/06/2007    Orientation RESPIRATION BLADDER  Height & Weight     Self, Time, Situation, Place  Normal Continent Weight: 68 kg Height:  5' 8 (172.7 cm)  BEHAVIORAL SYMPTOMS/MOOD NEUROLOGICAL BOWEL NUTRITION STATUS      Continent Diet  AMBULATORY STATUS COMMUNICATION OF NEEDS Skin   Extensive Assist Verbally Skin abrasions, Other (Comment) (Toe amputations)                        Personal Care Assistance Level of Assistance  Bathing, Dressing Bathing Assistance: Limited assistance   Dressing Assistance: Limited assistance     Functional Limitations Info             SPECIAL CARE FACTORS FREQUENCY                       Contractures Contractures Info: Not present    Additional Factors Info  Allergies   Allergies Info: Cefepime  High  Hives Had allergic reaction to one of these 3 agents, unclear which one  Metoprolol  High  Hives Had allergic reaction to one of these 3 agents, unclear which one *Per RN, highly likely this is the cause of allergic rxn  Myrbetriq  (mirabegron ) High  Hives Had allergic reaction to one of these 3 agents, unclear which one           Current Medications (09/02/2024):  This is the current hospital active medication list Current Facility-Administered Medications  Medication Dose Route Frequency Provider Last Rate Last Admin   acetaminophen  (TYLENOL ) tablet 650 mg  650 mg Oral Q6H PRN Laurita Manor T, MD       Or   acetaminophen  (TYLENOL ) suppository 650 mg  650 mg Rectal Q6H PRN Laurita Manor T, MD       albuterol  (PROVENTIL ) (2.5 MG/3ML) 0.083% nebulizer solution 2.5 mg  2.5 mg Inhalation Q4H PRN Laurita Manor T, MD       amiodarone  (PACERONE ) tablet 200 mg  200 mg Oral Daily Laurita Manor T, MD   200 mg at 09/02/24 0857   aspirin  EC tablet 81 mg  81 mg Oral Daily Laurita Manor T, MD   81 mg at 09/02/24 0857   atorvastatin  (LIPITOR ) tablet 40 mg  40 mg Oral Daily Zhang, Ping T, MD   40 mg at 09/02/24 0858   citalopram  (CELEXA ) tablet 20 mg  20 mg Oral Daily Laurita Manor T, MD   20 mg at 09/02/24 0858   feeding supplement (NEPRO CARB STEADY) liquid 237 mL  237 mL Oral TID BM Zhang, Ping T, MD   237 mL at 09/01/24 2231   furosemide  (LASIX ) tablet 40 mg  40 mg Oral Daily Laurita Manor T, MD   40 mg at 09/02/24 9141   HYDROcodone -acetaminophen  (NORCO/VICODIN) 5-325 MG per tablet 1-2 tablet  1-2 tablet Oral Q4H PRN  Laurita Manor T, MD   1 tablet at 09/01/24 2245   insulin  aspart (novoLOG ) injection 0-5 Units  0-5 Units Subcutaneous QHS Tan, Ting Xu, MD   2 Units at 08/31/24 2128   insulin  aspart (novoLOG ) injection 0-9 Units  0-9 Units Subcutaneous TID WC Waymond Lorelle Cummins, MD   2 Units at 09/02/24 9141   irbesartan  (AVAPRO ) tablet 300 mg  300 mg Oral Daily Laurita Manor T, MD   300 mg at 09/02/24 0857   linezolid  (ZYVOX ) IVPB 600 mg  600 mg Intravenous Q12H Laurita Manor T, MD 300 mL/hr at 09/01/24 2239 600 mg at 09/01/24 2239   meropenem  (MERREM ) 1 g  in sodium chloride  0.9 % 100 mL IVPB  1 g Intravenous Q8H Laurita Manor T, MD 200 mL/hr at 09/02/24 0625 1 g at 09/02/24 9374   ondansetron  (ZOFRAN ) tablet 4 mg  4 mg Oral Q6H PRN Laurita Manor DASEN, MD       Or   ondansetron  (ZOFRAN ) injection 4 mg  4 mg Intravenous Q6H PRN Laurita Manor T, MD       pantoprazole  (PROTONIX ) EC tablet 40 mg  40 mg Oral Daily Laurita Manor T, MD   40 mg at 09/02/24 0855   senna-docusate (Senokot-S) tablet 1 tablet  1 tablet Oral QHS PRN Laurita Manor DASEN, MD       SM Double Antibiotic 500-10000 UNIT/GM OINT 1 Application  1 Application Topical BID Niels Kayla FALCON, [facility]   1 Application at 09/02/24 9373   spironolactone  (ALDACTONE ) tablet 25 mg  25 mg Oral Daily Zhang, Ping T, MD   25 mg at 09/02/24 9141   traZODone  (DESYREL ) tablet 25 mg  25 mg Oral QHS PRN Laurita Manor DASEN, MD         Discharge Medications: Please see discharge summary for a list of discharge medications.  Relevant Imaging Results:  Relevant Lab Results:   Additional Information [id number]  Nathanael CHRISTELLA Ring, RN     "

## 2024-09-02 NOTE — Consult Note (Signed)
 " [facility] Vascular Consult Note  MRN : [id number]  [name] is a 69 y.o. (10-17-1955) male who presents with chief complaint of  Chief Complaint  Patient presents with   Fall  .   Consulting Physician:Sona Tobie, MD Reason for consult: Nonhealing TMA History of Present Illness: [name] is a 69 year old male who is well-known to our practice.  He has a previous medical history of PAD, left foot osteomyelitis with a transmetatarsal amputation on 08/18/2024, diabetes mellitus, hypertension coronary artery disease, bladder cancer, HFrEF and paroxysmal atrial fibrillation.  He also underwent a left femoral endarterectomy on 08/17/2024.  He presented due to having a nonhealing TMA stump with worsening cellulitis and abscess.  Recent MRI is inconclusive for possible presence of osteomyelitis however podiatry has seen the patient and is recommending below-knee amputation.  He had a recent wound culture which showed multiple organisms.  Unfortunately the patient is homeless and unable to care for himself and has had nonadherence to antibiotics and dressing changes which is likely led to the current course.  Has recent left groin surgical site does not appear to have any current signs of infection.  Current Facility-Administered Medications  Medication Dose Route Frequency Provider Last Rate Last Admin   acetaminophen  (TYLENOL ) tablet 650 mg  650 mg Oral Q6H PRN Laurita Manor T, MD       Or   acetaminophen  (TYLENOL ) suppository 650 mg  650 mg Rectal Q6H PRN Laurita Manor T, MD       albuterol  (PROVENTIL ) (2.5 MG/3ML) 0.083% nebulizer solution 2.5 mg  2.5 mg Inhalation Q4H PRN Laurita Manor T, MD       amiodarone  (PACERONE ) tablet 200 mg  200 mg Oral Daily Laurita Manor T, MD   200 mg at 09/02/24 0857   aspirin  EC tablet 81 mg  81 mg Oral Daily Zhang, Ping T, MD   81 mg at 09/02/24 0857   atorvastatin  (LIPITOR ) tablet 40 mg  40 mg Oral Daily Zhang, Ping T, MD   40 mg at 09/02/24  9141   citalopram  (CELEXA ) tablet 20 mg  20 mg Oral Daily Zhang, Ping T, MD   20 mg at 09/02/24 0858   feeding supplement (NEPRO CARB STEADY) liquid 237 mL  237 mL Oral TID BM Zhang, Ping T, MD   237 mL at 09/02/24 1358   furosemide  (LASIX ) tablet 40 mg  40 mg Oral Daily Laurita Manor T, MD   40 mg at 09/02/24 0858   heparin  ADULT infusion 100 units/mL (25000 units/250mL)  1,000 Units/hr Intravenous Continuous Perley Lum DEL, RPH 10 mL/hr at 09/02/24 1221 1,000 Units/hr at 09/02/24 1221   HYDROcodone -acetaminophen  (NORCO/VICODIN) 5-325 MG per tablet 1-2 tablet  1-2 tablet Oral Q4H PRN Laurita Manor DASEN, MD   1 tablet at 09/01/24 2245   insulin  aspart (novoLOG ) injection 0-5 Units  0-5 Units Subcutaneous QHS Tan, Ting Xu, MD   2 Units at 08/31/24 2128   insulin  aspart (novoLOG ) injection 0-9 Units  0-9 Units Subcutaneous TID WC Waymond Lorelle Cummins, MD   2 Units at 09/02/24 1228   irbesartan  (AVAPRO ) tablet 300 mg  300 mg Oral Daily Laurita Manor T, MD   300 mg at 09/02/24 0857   linezolid  (ZYVOX ) IVPB 600 mg  600 mg Intravenous Q12H Laurita Manor T, MD 300 mL/hr at 09/02/24 0907 600 mg at 09/02/24 0907   meropenem  (MERREM ) 1 g in sodium chloride  0.9 % 100 mL IVPB  1 g Intravenous  Q8H Laurita Manor T, MD 200 mL/hr at 09/02/24 1401 1 g at 09/02/24 1401   ondansetron  (ZOFRAN ) tablet 4 mg  4 mg Oral Q6H PRN Laurita Manor DASEN, MD       Or   ondansetron  (ZOFRAN ) injection 4 mg  4 mg Intravenous Q6H PRN Laurita Manor DASEN, MD       pantoprazole  (PROTONIX ) EC tablet 40 mg  40 mg Oral Daily Laurita Manor T, MD   40 mg at 09/02/24 0855   SM Double Antibiotic 500-10000 UNIT/GM OINT 1 Application  1 Application Topical BID Niels Kayla FALCON, [facility]   1 Application at 09/02/24 9140   spironolactone  (ALDACTONE ) tablet 25 mg  25 mg Oral Daily Laurita Manor T, MD   25 mg at 09/02/24 9141   traZODone  (DESYREL ) tablet 25 mg  25 mg Oral QHS PRN Laurita Manor DASEN, MD        Past Medical History:  Diagnosis Date   Adenomatous colon polyp    Allergic  rhinitis    At risk for sleep apnea    STOP-BANG= 5   SENT TO PCP 03-14-2014   Bladder cancer (HCC)    CAD (coronary artery disease)    a. 07/2014 low risk MV; b. 07/2019 Cor CTA (FFR): LM 25-49 (nl), LAD mild prox/mid plaque (nl), D1 25-49p(nl w/ abnl FFR of 0.73 in inf branch), LCX/OM1 mild prox/mid plaque (nl), RCA nondominant, minimal Ca2+ plaque (nl), RPDA (mildly abnl @ 0.79)-->Med Rx..   Cataract    surgically removed bilateral   Condyloma acuminatum of penis    Diabetic neuropathy (HCC)    Diastolic dysfunction    a. 05/2019 Echo: EF 60-65%, no rwam, mod LVH, impaired relaxation, nl RV size/fxn, trace MR, triv TR.   Diverticulosis    GERD (gastroesophageal reflux disease)    History of bladder cancer    s/p  turbt  2013/   transitional cell carcinoma--    History of condyloma acuminatum    PERINEAL AREA  W/ RECURRENCY   History of gout    Hyperlipidemia    Hypertension    Hypoxemia 03/09/2024   Lower urinary tract symptoms (LUTS)    PAF (paroxysmal atrial fibrillation) (HCC)    a. 06/2019 Event monitor: PAF <1% burden. Longest 3 mins 36 secs.   Productive cough    PSVT (paroxysmal supraventricular tachycardia)    a. 06/2019 Event monitor: 112 episodes of SVT, longest 21 secs.   PVD (peripheral vascular disease) with claudication    a. 03/2014 LE art duplex: long segment occlusion of mid to distal R SFA; b. 03/2020 ABI: nl left and mildly improved R ABI->med rx.   Renal artery stenosis    Renal artery stenosis    a. 12/2020 Renal art duplex: RRA 1-59%, LRA >60%.   Smokers' cough (HCC)    Type 2 diabetes mellitus with insulin  therapy [facility]) 1992   monitor by  dr ellsion   Wears dentures    upper    Past Surgical History:  Procedure Laterality Date   ABDOMINAL AORTOGRAM W/LOWER EXTREMITY N/A 02/15/2024   Procedure: ABDOMINAL AORTOGRAM W/LOWER EXTREMITY;  Surgeon: Pearline Norman RAMAN, MD;  Location: [facility] INVASIVE CV LAB;  Service: Cardiovascular;  Laterality: N/A;   ABDOMINAL  AORTOGRAM W/LOWER EXTREMITY N/A 08/15/2024   Procedure: ABDOMINAL AORTOGRAM W/LOWER EXTREMITY;  Surgeon: Marea Selinda RAMAN, MD;  Location: [facility] INVASIVE CV LAB;  Service: Cardiovascular;  Laterality: N/A;   AMPUTATION Left 01/02/2024   Procedure: AMPUTATION, FOOT, RAY;  Surgeon: Malvin,  Marsa FALCON, DPM;  Location: [facility] OR;  Service: Orthopedics/Podiatry;  Laterality: Left;  FIFTH TOE   AMPUTATION TOE Left 02/29/2024   Procedure: AMPUTATION, LEFT GREAT TOE;  Surgeon: Malvin Marsa FALCON, DPM;  Location: [facility] OR;  Service: Orthopedics/Podiatry;  Laterality: Left;  Left Great toe partial amputation,  Debridement Left Heel   AMPUTATION TOE Left 03/29/2024   Procedure: AMPUTATION LEFT GREAT TOE;  Surgeon: Malvin Marsa FALCON, DPM;  Location: [facility] OR;  Service: Orthopedics/Podiatry;  Laterality: Left;  LEFT GREAT TOE   APPLICATION OF CELL SAVER Left 08/17/2024   Procedure: APPLICATION OF CELL SAVER;  Surgeon: Marea Selinda RAMAN, MD;  Location: [facility] ORS;  Service: Vascular;  Laterality: Left;  SFA thrombectomy and stent   AXILLARY HIDRADENITIS EXCISION  1997   CARDIOVASCULAR STRESS TEST  07-24-2014  dr darron grass   Low risk lexiscan  nuclear study with apical thinning and small inferolateral wall infarct at mid & basal level , no ischemia/  normal LVF and wall motion , ef 59%   CATARACT EXTRACTION Left    CATARACT EXTRACTION W/ INTRAOCULAR LENS IMPLANT Right    CO2 LASER APPLICATION N/A 03/20/2014   Procedure: CO2 LASER APPLICATION,PENIS, GROIN, ANUS;  Surgeon: Elspeth KYM Schultze, MD;  Location: [facility];  Service: General;  Laterality: N/A;   CO2 LASER APPLICATION N/A 05/21/2015   Procedure: CO2 LASER APPLICATION;  Surgeon: Mark Ottelin, MD;  Location: [facility];  Service: Urology;  Laterality: N/A;   CONDYLOMA EXCISION/FULGURATION N/A 05/21/2015   Procedure: CONDYLOMA REMOVAL;  Surgeon: Mark Ottelin, MD;  Location: [facility];  Service: Urology;  Laterality:  N/A;   CORONARY PRESSURE/FFR STUDY N/A 08/16/2024   Procedure: CORONARY PRESSURE/FFR STUDY;  Surgeon: Mady Bruckner, MD;  Location: [facility] INVASIVE CV LAB;  Service: Cardiovascular;  Laterality: N/A;   ENDARTERECTOMY FEMORAL Left 08/17/2024   Procedure: ENDARTERECTOMY, FEMORAL;  Surgeon: Marea Selinda RAMAN, MD;  Location: [facility] ORS;  Service: Vascular;  Laterality: Left;   HEMORRHOID SURGERY  10/24/2014   Procedure: HEMORRHOIDECTOMY;  Surgeon: Elspeth Schultze, MD;  Location: [facility];  Service: General;;   INCISION AND DRAINAGE ABSCESS Left 10/24/2014   Procedure: INCISION AND DRAINAGE ABSCESS;  Surgeon: Elspeth Schultze, MD;  Location: [facility];  Service: General;  Laterality: Left;   INCISION AND DRAINAGE OF WOUND Left 03/29/2024   Procedure: IRRIGATION AND DEBRIDEMENT PLANTAR FOOT WOUND;  Surgeon: Malvin Marsa FALCON, DPM;  Location: [facility];  Service: Orthopedics/Podiatry;  Laterality: Left;  LEFT GREAT TOE   INGUINAL HIDRADENITIS EXCISION  1998, 1999   INSERTION OF ILIAC STENT Left 08/17/2024   Procedure: INSERTION, STENT, ARTERY, ILIAC;  Surgeon: Marea Selinda RAMAN, MD;  Location: [facility] ORS;  Service: Vascular;  Laterality: Left;  SFA X2   LASER ABLATION CONDOLAMATA N/A 03/20/2014   Procedure: EXAM UNDER ANESTHESIA, REMOVAL/ABLATION OF CONDYLOMATA PENIS,GROINS, ANUS, ANAL CANAL;  Surgeon: Elspeth KYM Schultze, MD;  Location: [facility];  Service: General;  Laterality: N/A;  groin and anus   LASER ABLATION CONDOLAMATA N/A 10/24/2014   Procedure: LASER ABLATION CONDOLAMATA;  Surgeon: Elspeth Schultze, MD;  Location: [facility];  Service: General;  Laterality: N/A;   LASER ABLATION OF PENILE AND PERIANAL WARTS  07-29-2007  Dr. Schultze   LEFT SHOULDER SURGERY  2003   LOWER EXTREMITY ANGIOGRAPHY N/A 01/01/2024   Procedure: Lower Extremity Angiography;  Surgeon: Sheree Penne Bruckner, MD;  Location: [facility] INVASIVE CV LAB;  Service: Vascular;  Laterality: N/A;  LOWER EXTREMITY ANGIOGRAPHY N/A 02/15/2024   Procedure: Lower Extremity Angiography;  Surgeon: Pearline Norman RAMAN, MD;  Location: [facility] INVASIVE CV LAB;  Service: Cardiovascular;  Laterality: N/A;   LOWER EXTREMITY INTERVENTION  01/01/2024   Procedure: LOWER EXTREMITY INTERVENTION;  Surgeon: Sheree Penne Bruckner, MD;  Location: [facility] INVASIVE CV LAB;  Service: Vascular;;   LOWER EXTREMITY INTERVENTION N/A 02/15/2024   Procedure: LOWER EXTREMITY INTERVENTION;  Surgeon: Pearline Norman RAMAN, MD;  Location: [facility] INVASIVE CV LAB;  Service: Cardiovascular;  Laterality: N/A;   MASS EXCISION N/A 10/24/2014   Procedure: EXCISION OF PERINEAL MASS/SINUS;  Surgeon: Elspeth Schultze, MD;  Location: [facility];  Service: General;  Laterality: N/A;   MOHS SURGERY     back   MOHS SURGERY  2017   face   MULTIPLE TOOTH EXTRACTIONS     PERINEAL HIDRADENITIS EXCISION  1998, 1999   RIGHT/LEFT HEART CATH AND CORONARY ANGIOGRAPHY N/A 08/16/2024   Procedure: RIGHT/LEFT HEART CATH AND CORONARY ANGIOGRAPHY;  Surgeon: Mady Bruckner, MD;  Location: [facility] INVASIVE CV LAB;  Service: Cardiovascular;  Laterality: N/A;   THROMBECTOMY FEMORAL ARTERY Left 08/17/2024   Procedure: THROMBECTOMY, ARTERY, FEMORAL;  Surgeon: Marea Selinda RAMAN, MD;  Location: [facility] ORS;  Service: Vascular;  Laterality: Left;   TRANSMETATARSAL AMPUTATION Left 08/18/2024   Procedure: AMPUTATION, FOOT, TRANSMETATARSAL;  Surgeon: Malvin Marsa FALCON, DPM;  Location: [facility] ORS;  Service: Orthopedics/Podiatry;  Laterality: Left;  Left foot TMA   TRANSURETHRAL RESECTION OF BLADDER TUMOR  05/21/2012   Procedure: TRANSURETHRAL RESECTION OF BLADDER TUMOR (TURBT);  Surgeon: Mark C Ottelin, MD;  Location: [facility];  Service: Urology;  Laterality: N/A;       Social History Social History[1]  Family History Family History  Problem Relation Age of Onset   Hypertension Mother    Lung cancer Father    Diabetes Maternal Aunt        x 2    Colon cancer Neg Hx    Esophageal cancer Neg Hx    Pancreatic cancer Neg Hx    Prostate cancer Neg Hx    Kidney disease Neg Hx    Liver disease Neg Hx    Rectal cancer Neg Hx    Stomach cancer Neg Hx     Allergies[2]   REVIEW OF SYSTEMS (Negative unless checked)  Constitutional: [] Weight loss  [] Fever  [] Chills Cardiac: [] Chest pain   [] Chest pressure   [] Palpitations   [] Shortness of breath when laying flat   [] Shortness of breath at rest   [] Shortness of breath with exertion. Vascular:  [] Pain in legs with walking   [] Pain in legs at rest   [] Pain in legs when laying flat   [] Claudication   [] Pain in feet when walking  [] Pain in feet at rest  [] Pain in feet when laying flat   [] History of DVT   [] Phlebitis   [] Swelling in legs   [] Varicose veins   [] Non-healing ulcers Pulmonary:   [] Uses home oxygen   [] Productive cough   [] Hemoptysis   [] Wheeze  [] COPD   [] Asthma Neurologic:  [] Dizziness  [] Blackouts   [] Seizures   [] History of stroke   [] History of TIA  [] Aphasia   [] Temporary blindness   [] Dysphagia   [] Weakness or numbness in arms   [] Weakness or numbness in legs Musculoskeletal:  [] Arthritis   [] Joint swelling   [] Joint pain   [] Low back pain Hematologic:  [] Easy bruising  [] Easy bleeding   [] Hypercoagulable state   [] Anemic  [] Hepatitis Gastrointestinal:  []   Blood in stool   [] Vomiting blood  [] Gastroesophageal reflux/heartburn   [] Difficulty swallowing. Genitourinary:  [] Chronic kidney disease   [] Difficult urination  [] Frequent urination  [] Burning with urination   [] Blood in urine Skin:  [] Rashes   [] Ulcers   [x] Wounds Psychological:  [] History of anxiety   []  History of major depression.  Physical Examination  Vitals:   09/01/24 1931 09/02/24 0410 09/02/24 0810 09/02/24 1610  BP: (!) 152/64 (!) 157/64 139/65 (!) 144/78  Pulse: 73 76 72 70  Resp: 17 16 17 16   Temp: 98.8 F (37.1 C) 98.6 F (37 C) 98.5 F (36.9 C) 97.8 F (36.6 C)  TempSrc: Oral     SpO2: 97% 93% 96%  98%  Weight:      Height:       Body mass index is 22.81 kg/m. Gen:  WD/WN, NAD Head: [place]/AT, No temporalis wasting. Prominent temp pulse not noted. Ear/Nose/Throat: Hearing grossly intact, nares w/o erythema or drainage, oropharynx w/o Erythema/Exudate Eyes: Sclera non-icteric, conjunctiva clear Neck: Trachea midline.  No JVD.  Pulmonary:  Good air movement, respirations not labored, equal bilaterally.  Cardiac: RRR, normal S1, S2. Vascular: Groin incision intact although staples uncomfortable for patient Vessel Right Left  Radial Palpable Palpable   Gastrointestinal: soft, non-tender/non-distended. No guarding/reflex.  Musculoskeletal: M/S 5/5 throughout.  Necrotic near TMA incision line Neurologic: Sensation grossly intact in extremities.  Symmetrical.  Speech is fluent. Motor exam as listed above. Psychiatric: Judgment intact, Mood & affect appropriate for pt's clinical situation. Dermatologic: Pressure ulcer on bottom, groin incision, left TMA Lymph : No Cervical, Axillary, or Inguinal lymphadenopathy.    CBC Lab Results  Component Value Date   WBC 12.7 (H) 09/02/2024   HGB 10.1 (L) 09/02/2024   HCT 29.9 (L) 09/02/2024   MCV 95.5 09/02/2024   PLT 302 09/02/2024    BMET    Component Value Date/Time   NA 138 09/02/2024 0557   NA 138 06/16/2019 1144   K 4.1 09/02/2024 0557   CL 103 09/02/2024 0557   CO2 27 09/02/2024 0557   GLUCOSE 184 (H) 09/02/2024 0557   BUN 15 09/02/2024 0557   BUN 15 06/16/2019 1144   CREATININE 0.72 09/02/2024 0557   CALCIUM  8.6 (L) 09/02/2024 0557   GFRNONAA >60 09/02/2024 0557   GFRAA >60 02/29/2020 9062   Estimated Creatinine Clearance: 86.2 mL/min (by C-G formula based on SCr of 0.72 mg/dL).  COAG Lab Results  Component Value Date   INR 1.1 09/02/2024   INR 1.1 08/10/2024   INR 1.0 03/27/2024    Radiology MR FOOT LEFT W WO CONTRAST Result Date: 09/01/2024 CLINICAL DATA:  Patient reports increased redness at the amputation  stump. Status post first through fifth ray transmetatarsal amputation on 08/18/2024. EXAM: MRI OF THE LEFT FOREFOOT WITHOUT AND WITH CONTRAST TECHNIQUE: Multiplanar, multisequence MR imaging of the left foot was performed both before and after administration of intravenous contrast. CONTRAST:  7mL GADAVIST  GADOBUTROL  1 MMOL/ML IV SOLN COMPARISON:  Radiographs of the left foot dated 08/30/2024. MRI of the left foot dated 08/10/2024. FINDINGS: Bones/Joint/Cartilage Postoperative changes related to first through fifth ray transmetatarsal amputation to the level of the first through fifth metatarsal bases. There is nonspecific increased T2/STIR marrow signal at the amputation margins of the third, fourth, and fifth metatarsal base remnants with enhancement. These findings are favored to reflect sequela of recent postsurgical change, however, osteomyelitis can not be entirely excluded. Overlying amputation stump with subcutaneous soft tissue edema and heterogenous fluid signal  measuring up to 2.3 cm craniocaudal by 0.5 cm AP demonstrating thin peripheral enhancement extending to the amputation margins of the first through fifth metatarsal base remnants, compatible with a postoperative collection of indeterminate sterility. Cutaneous staples with associated susceptibility artifact. Mild degenerative changes of the hindfoot and midfoot with dorsal talar spurring. Ligaments Medial and lateral ankle ligaments appear grossly intact. Lisfranc ligament appears intact. Muscles and Tendons Musculotendinous postoperative changes. No significant tenosynovial fluid collection. Increased T2 signal of the musculature without significant associated enhancement likely reflects chronic denervation changes. Soft tissue Postoperative changes as described above. Nonspecific subcutaneous edema of the ankle extending through the dorsal foot. IMPRESSION: 1. Postoperative changes related to first through fifth ray transmetatarsal amputation to  the level of the first through fifth metatarsal bases. Nonspecific increased edema like signal at the amputation margins of the third, fourth, and fifth metatarsal base remnants with enhancement. These findings are favored to reflect sequela of recent postsurgical change, however, osteomyelitis can not be entirely excluded. 2. Overlying amputation stump with subcutaneous soft tissue edema and heterogenous fluid signal measuring up to 2.3 x 0.5 cm demonstrating thin peripheral enhancement extending to the amputation margins of the first through fifth metatarsal base remnants, compatible with a postoperative collection of indeterminate sterility. Electronically Signed   By: Harrietta Sherry M.D.   On: 09/01/2024 12:44   DG Chest 1 View Result Date: 09/01/2024 EXAM: 1 VIEW(S) XRAY OF THE CHEST 09/01/2024 11:08:00 AM COMPARISON: 07/13/2024 CLINICAL HISTORY: Congestive heart failure. ICD10: [id number] Congestive heart failure. FINDINGS: LUNGS AND PLEURA: Persistent trace left pleural effusion with associated left basilar opacity. No pneumothorax. HEART AND MEDIASTINUM: Aortic atherosclerosis. No acute abnormality of the cardiac and mediastinal silhouettes. BONES AND SOFT TISSUES: Old left clavicular fracture. IMPRESSION: 1. Persistent trace left pleural effusion with associated left basilar atelectasis. Electronically signed by: Rogelia Myers MD 09/01/2024 11:29 AM EST RP Workstation: [id number]   DG Foot 2 Views Left Result Date: 08/30/2024 EXAM: 2 Views XRAY of the Left Foot 08/30/2024 09:15:00 PM COMPARISON: 08/18/2024. CLINICAL HISTORY: Post op pain FINDINGS: Changes of transmetatarsal amputation of all 5 left toes. No change since prior study. No acute bony abnormality. SOFT TISSUES: Unremarkable. Surgical clips along the stump. IMPRESSION: 1. 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 Electronically signed by: Franky Crease MD  08/30/2024 09:31 PM EST RP Workstation: [id number]   DG Foot 2 Views Left Result Date: 08/18/2024 EXAM: 2 VIEW(S) XRAY OF THE LEFT FOOT 08/18/2024 02:02:36 PM COMPARISON: 08/10/2024 CLINICAL HISTORY: Post-operative state FINDINGS: BONES AND JOINTS: Midfoot surgical staples in place. Postoperative changes. Status post transmetatarsal amputation of the first through fifth ray. Plantar and posterior calcaneal spurs. Expected postsurgical changes. SOFT TISSUES: Soft tissue swelling and subcutaneous emphysema along the plantar surface of the forefoot, consistent with postsurgical changes. IMPRESSION: 1. Status post transmetatarsal amputation of the first through fifth ray with expected postsurgical changes manifested by soft tissue swelling and subcutaneous emphysema along the plantar surface of the forefoot. Electronically signed by: Greig Pique MD MD 08/18/2024 07:34 PM EST RP Workstation: [id number]   DG C-Arm 1-60 Min-No Report Result Date: 08/17/2024 Fluoroscopy was utilized by the requesting physician.  No radiographic interpretation.   CARDIAC CATHETERIZATION Result Date: 08/16/2024   Mid LAD lesion is 50% stenosed.   Ost LM lesion is 30% stenosed.   There is moderate to severe left ventricular systolic dysfunction.   LV end diastolic pressure is mildly elevated.  There is no aortic valve stenosis.   Anticipated discharge date to be determined.   Continue heparin .  Defer continuation of aspirin  as well as transition back to apixaban  to vascular surgery/internal medicine. Conclusions: Mild-moderate, nonobstructive coronary artery disease with 30% ostial LMCA and 50% mid LAD stenoses that are not hemodynamically significant (RFR 0.92). Mildly elevated left heart filling pressures (PCWP 20, LVEDP 18 mmHg). Upper normal right heart filling pressure (mean RA 5, RV EDP 6 mmHg). Moderate pulmonary hypertension (PA 60/22, mean 35 mmHg; PVR 3 WU). Normal Fick cardiac output/index (CO 5.0 L/min, CI 2.7 L/min/m).  Recommendations: Continue medical therapy to prevent progression of moderate, nonobstructive CAD. Continue gentle diuresis and escalation of goal-directed medical therapy for nonischemic cardiomyopathy. Lonni Hanson, MD [facility]  PERIPHERAL VASCULAR CATHETERIZATION Result Date: 08/15/2024 See surgical note for result.  US  ARTERIAL ABI (SCREENING LOWER EXTREMITY) Result Date: 08/12/2024 CLINICAL DATA:  Peripheral arterial disease. EXAM: NONINVASIVE PHYSIOLOGIC VASCULAR STUDY OF BILATERAL LOWER EXTREMITIES TECHNIQUE: Evaluation of both lower extremities were performed at rest, including calculation of ankle-brachial indices with single level Doppler, pressure and pulse volume recording. COMPARISON:  Lower extremity arterial duplex 08/11/2024 FINDINGS: Right ABI:  0.59 Left ABI:  0.45 Right Lower Extremity: Predominantly monophasic waveforms in the right ankle. Left Lower Extremity:  Irregular waveforms at the left ankle. 0.5-0.79 Moderate PAD IMPRESSION: 1. Right ABI is 0.59 and compatible with moderate peripheral arterial disease. 2. Left ABI is 0.45 and compatible with severe peripheral arterial disease. Electronically Signed   By: Juliene Balder M.D.   On: 08/12/2024 11:11   US  ARTERIAL LOWER EXTREMITY DUPLEX BILATERAL Result Date: 08/11/2024 CLINICAL DATA:  69 year old history of peripheral artery disease. EXAM: BILATERAL LOWER EXTREMITY ARTERIAL DUPLEX SCAN TECHNIQUE: Gray-scale sonography as well as color Doppler and duplex ultrasound was performed to evaluate the arteries of both lower extremities including the common, superficial and profunda femoral arteries, popliteal artery and calf arteries. COMPARISON:  None Available. FINDINGS: Right Lower Extremity ABI: Not obtained. Inflow: Normal common femoral arterial waveforms and velocities. No evidence of inflow (aortoiliac) disease. Outflow: Occluded appearance proximal limits. The distal superficial femoral and popliteal arteries demonstrate  monophasic waveforms sluggish velocities of 26 centimeters/second Runoff: Monophasic posterior and anterior tibial arterial waveforms. Vessels are patent to the ankle. Left Lower Extremity ABI: Not obtained. Inflow: Normal common femoral arterial waveforms and velocities. No evidence of inflow (aortoiliac) disease. Outflow: The superficial femoral artery appears patent proximally and distally with absent flow in the midportion. Sluggish, monophasic waveforms in the patent portions. The popliteal artery appears patent with monophasic waveforms. Runoff: Monophasic posterior and anterior tibial arterial waveforms. Vessels are patent to the ankle. IMPRESSION: 1. 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 2. Occlusion of the mid left superficial femoral artery with distal reconstitution. 3. No definite evidence of inflow disease. 4. Patent bilateral runoff vessels. Ester Sides, MD [facility] Electronically Signed   By: Ester Sides M.D.   On: 08/11/2024 17:46   MR FOOT LEFT WO CONTRAST Result Date: 08/11/2024 CLINICAL DATA:  Pain.  Chronic plantar foot wound.  Recent fall. EXAM: MRI OF THE LEFT FOOT WITHOUT CONTRAST TECHNIQUE: Multiplanar, multisequence MR imaging of the left foot was performed. No intravenous contrast was administered. COMPARISON:  Radiographs left foot dated 08/10/2024. MRI of the left foot dated 03/28/2024. FINDINGS: Bones/Joint/Cartilage Status post amputation of the great toe and fifth toe. Soft tissue wound at the plantar forefoot overlying the level of the  first metatarsal head with heterogenous fluid signal extending into the subcutaneous tissues at the level of the amputation stump measuring approximately 1.5 x 1.3 x 1.0 cm with foci of susceptibility artifact likely reflecting soft tissue air communicating from the overlying open wound. Bipartite medial hallux sesamoid with  similar-appearing increased T2 signal compared to the prior exam. No convincing evidence of acute osteomyelitis at this time. Ligaments Collateral ligaments of the remaining MTP joints appear intact. Lisfranc ligament is intact. Muscles and Tendons Redemonstrated generalized muscular edema without discrete intramuscular collection. No significant tenosynovitis. 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 Soft tissue Soft tissue wound at the plantar medial forefoot, as described above. Subcutaneous edema of the dorsal foot. IMPRESSION: 1. Soft tissue wound at the plantar medial forefoot overlying the level of the first metatarsal head with heterogenous fluid signal extending into the subcutaneous tissues of the amputation stump measuring approximately 1.5 x 1.3 x 1.0 cm, possibly representing organizing phlegmon, with foci of susceptibility artifact likely reflecting soft tissue air communicating from the overlying open wound. 2. No convincing evidence of acute osteomyelitis at this time. 3. Bipartite medial hallux sesamoid with similar-appearing increased T2 signal compared to the prior exam. 4. Status post amputation of the great toe and fifth toe. 5. 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 Electronically Signed   By: Harrietta Sherry M.D.   On: 08/11/2024 10:30   US  Venous Img Lower Unilateral Left (DVT) Result Date: 08/10/2024 EXAM: ULTRASOUND DUPLEX OF THE LEFT LOWER EXTREMITY VEINS TECHNIQUE: Duplex ultrasound using B-mode/gray scaled imaging and Doppler spectral analysis and color flow was obtained of the deep venous structures of the left lower extremity. COMPARISON: None available. CLINICAL HISTORY: Left leg erythema. FINDINGS: The common femoral vein, femoral vein, popliteal vein, and posterior tibial vein demonstrate normal compressibility with normal color flow and spectral analysis. IMPRESSION: 1. No evidence of  deep venous thrombosis. Electronically signed by: Greig Pique MD 08/10/2024 11:57 PM EST RP Workstation: [id number]   DG Foot 2 Views Left Result Date: 08/10/2024 EXAM: 2 VIEW(S) XRAY OF THE LEFT FOOT 08/10/2024 09:34:00 PM COMPARISON: 03/29/2024 CLINICAL HISTORY: fall, (no foot injury) erythema, eval for gas in mid foot (appears to have a hole in bottom of L foot (chronic appearing)) FINDINGS: BONES AND JOINTS: Status post amputation of the great toe and fifth toe at the level of the MTP joint. Mild cortical irregularity along the medial margin of the remaining first metatarsal head, suspicious for osteomyelitis. Small superior and plantar calcaneal spurs. SOFT TISSUES: Soft tissue swelling along the plantar surface of the forefoot at the amputation site with subcutaneous gas. IMPRESSION: 1. Soft tissue swelling along the plantar surface of the forefoot at the amputation site with subcutaneous gas. 2. Mild cortical irregularity along the medial margin of the remaining first metatarsal head, which could indicate osteomyelitis. 3. 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 Electronically signed by: Franky Stanford MD 08/10/2024 10:16 PM EST RP Workstation: [id number]   CT Head Wo Contrast Result Date: 08/10/2024 EXAM: CT HEAD WITHOUT CONTRAST 08/10/2024 09:28:38 PM TECHNIQUE: CT of the head was performed without the administration of intravenous contrast. Automated exposure control, iterative reconstruction, and/or weight based adjustment of the mA/kV was utilized to reduce the radiation dose to as low as reasonably achievable. COMPARISON: 07/13/2024 CLINICAL HISTORY: Facial trauma, blunt; fall, on anticoag. FINDINGS: BRAIN AND VENTRICLES: No acute hemorrhage. No evidence of acute  infarct. No hydrocephalus. No extra-axial collection. No mass effect or midline shift. Stable age-related cerebral volume loss. Stable mild periventricular white matter disease. ORBITS:  Bilateral lens replacement. SINUSES: Mucosal disease within ethmoid air cells and left maxillary sinus. SOFT TISSUES AND SKULL: No acute soft tissue abnormality. No skull fracture. VASCULATURE: Moderate to heavy calcific plaque within carotid siphons and vertebral arteries. IMPRESSION: 1. No acute intracranial abnormality. Electronically signed by: Donnice Mania MD 08/10/2024 09:38 PM EST RP Workstation: [id number]      Assessment/Plan  I discussed the assessment evaluation of podiatry with the patient.  Unfortunately at this time he has very limited limb salvage options available.  I discussed with the patient regarding a more proximal below-knee amputation.  I demonstrated to the patient where the amputation would take place.  He has concerning cellulitis and possible underlying osteomyelitis with noted necrosis along his incision line.  We discussed that he is at risk of infection worsening which would mainly need to a further infection such as sepsis.  Based on this it would be in his best interest to move forward with below-knee amputation.  At this time he is agreeable to move forward with below-knee amputation.  We discussed that this will likely take place this weekend.  We discussed that he would require staying for some time post amputation and would likely require some sort of rehab placement afterwards.   Plan of care discussed with Dr. Marea and he is in agreement with plan noted above.     I spent 75 minutes in this encounter including personally reviewing extensive medical records, personally reviewing imaging studies and compared to prior scans, counseling the patient, placing orders, coordinating care and performing appropriate documentation  Thank you for allowing us  to participate in the care of this patient.   Melchizedek Espinola E Anique Beckley, NP [facility] [phone] (Office Phone) [phone] (Office Fax) [phone] (Pager)  09/02/2024 4:35 PM  Staff may  message me via secure chat in Epic  but this may not receive immediate response,  please page for urgent matters!  Dictation software was used to generate the above note. Typos may occur and escape review, as with typed/written notes. Any error is purely unintentional.  Please contact me directly for clarity if needed.      [1]  Social History Tobacco Use   Smoking status: Every Day    Current packs/day: 0.00    Average packs/day: 1 pack/day for 41.0 years (41.0 ttl pk-yrs)    Types: Cigars, Cigarettes    Start date: 1976    Last attempt to quit: 2017    Years since quitting: 9.0   Smokeless tobacco: Never   Tobacco comments:    Quit cigarettes in 2017 but started back smoking Cigars  Vaping Use   Vaping status: Former  Substance Use Topics   Alcohol use: No    Alcohol/week: 0.0 standard drinks of alcohol   Drug use: No  [2]  Allergies Allergen Reactions   Cefepime  Hives    Had allergic reaction to one of these 3 agents, unclear which one   Metoprolol  Hives    Had allergic reaction to one of these 3 agents, unclear which one  *Per RN, highly likely this is the cause of allergic rxn   Myrbetriq  [Mirabegron ] Hives    Had allergic reaction to one of these 3 agents, unclear which one   "

## 2024-09-02 NOTE — Telephone Encounter (Signed)
 Patient currently present at ED for failure to thrive (starting 01/20)

## 2024-09-02 NOTE — TOC Progression Note (Addendum)
 Transition of Care [facility]) - Progression Note    Patient Details  Name: [name] MRN: [id number] Date of Birth: May 10, 1956  [facility] of [facility]) CM/SW Contact  Grayce JAYSON Perfect, RN Phone Number: 09/02/2024, 11:10 AM  Clinical Narrative:   RNCM met with patient in his room.  He is agreeable for short term rehab. Notified him that 3 facilities have made a bed offer, will check with him later in day for decision.  Authorization started. Patient chose [facility] in [place].    Contact for [facility]: Tammy:  [phone] Etta- weekend- [phone]                    Expected Discharge Plan and Services                                               Social Drivers of Health (SDOH) Interventions SDOH Screenings   Food Insecurity: Food Insecurity Present (09/01/2024)  Housing: High Risk (09/01/2024)  Transportation Needs: Unmet Transportation Needs (09/01/2024)  Utilities: At Risk (09/01/2024)  Alcohol Screen: Low Risk (06/10/2023)  Depression (PHQ2-9): Low Risk (07/05/2024)  Financial Resource Strain: High Risk (03/11/2024)  Physical Activity: Inactive (06/10/2023)  Social Connections: Socially Isolated (09/01/2024)  Stress: Stress Concern Present (03/11/2024)  Tobacco Use: High Risk (08/30/2024)  Health Literacy: Inadequate Health Literacy (02/17/2024)    Readmission Risk Interventions    03/30/2024    2:55 PM 03/09/2024    3:21 PM 12/30/2023    1:17 PM  Readmission Risk Prevention Plan  Post Dischage Appt   Complete  Medication Screening   Complete  Transportation Screening Complete Complete Complete  PCP or Specialist Appt within 5-7 Days  Complete   PCP or Specialist Appt within 3-5 Days Complete    Home Care Screening  Complete   Medication Review (RN CM)  Complete   HRI or Home Care Consult Complete    Social Work Consult for Recovery Care Planning/Counseling Complete    Palliative Care Screening Complete    Medication Review  Oceanographer) Referral to Pharmacy

## 2024-09-02 NOTE — Progress Notes (Signed)
 [facility]  - [place] [facility]   PATIENT NAME: [name]    MR#:  [id number]  DATE OF BIRTH:  26-Apr-1956  SUBJECTIVE:  no family at bedside. Patient came in from home walls in the ED for last two days for nonhealing amputation stump. Started having runny a stool since last night. Admitted for further evaluation management    VITALS:  Blood pressure 139/65, pulse 72, temperature 98.5 F (36.9 C), resp. rate 17, height 5' 8 (1.727 m), weight 68 kg, SpO2 96%.  PHYSICAL EXAMINATION:   GENERAL:  69 y.o.-year-old patient with no acute distress. disheveled LUNGS: Normal breath sounds bilaterally, no wheezing CARDIOVASCULAR: S1, S2 normal. No murmur   ABDOMEN: Soft, nontender, nondistended. Bowel sounds present.  EXTREMITIES:    NEUROLOGIC: nonfocal  patient is alert and awake SKIN: as above  LABORATORY PANEL:  CBC Recent Labs  Lab 09/02/24 0557  WBC 12.7*  HGB 10.1*  HCT 29.9*  PLT 302    Chemistries  Recent Labs  Lab 09/02/24 0557  NA 138  K 4.1  CL 103  CO2 27  GLUCOSE 184*  BUN 15  CREATININE 0.72  CALCIUM  8.6*   Cardiac Enzymes No results for input(s): TROPONINI in the last 168 hours. RADIOLOGY:  MR FOOT LEFT W WO CONTRAST Result Date: 09/01/2024 CLINICAL DATA:  Patient reports increased redness at the amputation stump. Status post first through fifth ray transmetatarsal amputation on 08/18/2024. EXAM: MRI OF THE LEFT FOREFOOT WITHOUT AND WITH CONTRAST TECHNIQUE: Multiplanar, multisequence MR imaging of the left foot was performed both before and after administration of intravenous contrast. CONTRAST:  7mL GADAVIST  GADOBUTROL  1 MMOL/ML IV SOLN COMPARISON:  Radiographs of the left foot dated 08/30/2024. MRI of the left foot dated 08/10/2024. FINDINGS: Bones/Joint/Cartilage Postoperative changes related to first through fifth ray transmetatarsal amputation to the level of the first through fifth metatarsal bases. There is nonspecific  increased T2/STIR marrow signal at the amputation margins of the third, fourth, and fifth metatarsal base remnants with enhancement. These findings are favored to reflect sequela of recent postsurgical change, however, osteomyelitis can not be entirely excluded. Overlying amputation stump with subcutaneous soft tissue edema and heterogenous fluid signal measuring up to 2.3 cm craniocaudal by 0.5 cm AP demonstrating thin peripheral enhancement extending to the amputation margins of the first through fifth metatarsal base remnants, compatible with a postoperative collection of indeterminate sterility. Cutaneous staples with associated susceptibility artifact. Mild degenerative changes of the hindfoot and midfoot with dorsal talar spurring. Ligaments Medial and lateral ankle ligaments appear grossly intact. Lisfranc ligament appears intact. Muscles and Tendons Musculotendinous postoperative changes. No significant tenosynovial fluid collection. Increased T2 signal of the musculature without significant associated enhancement likely reflects chronic denervation changes. Soft tissue Postoperative changes as described above. Nonspecific subcutaneous edema of the ankle extending through the dorsal foot. IMPRESSION: 1. Postoperative changes related to first through fifth ray transmetatarsal amputation to the level of the first through fifth metatarsal bases. Nonspecific increased edema like signal at the amputation margins of the third, fourth, and fifth metatarsal base remnants with enhancement. These findings are favored to reflect sequela of recent postsurgical change, however, osteomyelitis can not be entirely excluded. 2. Overlying amputation stump with subcutaneous soft tissue edema and heterogenous fluid signal measuring up to 2.3 x 0.5 cm demonstrating thin peripheral enhancement extending to the amputation margins of the first through fifth metatarsal base remnants, compatible with a postoperative collection of  indeterminate sterility. Electronically Signed   By: Harrietta  Lateef M.D.   On: 09/01/2024 12:44   DG Chest 1 View Result Date: 09/01/2024 EXAM: 1 VIEW(S) XRAY OF THE CHEST 09/01/2024 11:08:00 AM COMPARISON: 07/13/2024 CLINICAL HISTORY: Congestive heart failure. ICD10: [id number] Congestive heart failure. FINDINGS: LUNGS AND PLEURA: Persistent trace left pleural effusion with associated left basilar opacity. No pneumothorax. HEART AND MEDIASTINUM: Aortic atherosclerosis. No acute abnormality of the cardiac and mediastinal silhouettes. BONES AND SOFT TISSUES: Old left clavicular fracture. IMPRESSION: 1. Persistent trace left pleural effusion with associated left basilar atelectasis. Electronically signed by: Rogelia Myers MD 09/01/2024 11:29 AM EST RP Workstation: [id number]    Assessment and Plan  From H&P   [name] is a 69 y.o. 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   Patient was recently hospitalized 2 weeks ago for left foot infection including cellulitis, he was treated with IV antibiotics and discharged home on 7 days course of Augmentin  and Zyvox .  Patient told me that however he was not able to get his prescription filled due to transportation problems, this Monday, he was able to went to [facility] to pick up his antibiotics.  But at that time he already noticed worsening of left foot infection and decided to come to the hospital instead of taking p.o. antibiotics at home.  MRI left foot :1. Postoperative changes related to first through fifth ray  transmetatarsal amputation to the level of the first through fifth  metatarsal bases. Nonspecific increased  edema like signal at the  amputation margins of the third, fourth, and fifth metatarsal base  remnants with enhancement. These findings are favored to reflect  sequela of recent postsurgical change, however, osteomyelitis can  not be entirely excluded.  2. Overlying amputation stump with subcutaneous soft tissue edema  and heterogenous fluid signal measuring up to 2.3 x 0.5 cm  demonstrating thin peripheral enhancement extending to the  amputation margins of the first through fifth metatarsal base  remnants, compatible with a postoperative collection of  indeterminate sterility.   Worsening of left foot TMA site cellulitis/abscess - Secondary to nonadherence to antibiotics/dressing change and unable to care for self - Review of most recent wound culture showed multiple organisms, plan to continue antibiotic regimen on discharge from last week, Zyvox  + Meropenem  for now.  - Podiatry consulted in ED--recommends vascular consult--Dr Dew aware--will d/w regarding BKA - Blood culture negative so far   PAD --Status post recent-left common femoral artery, profunda femoris, SFA endarterectomy and patch he angioplasty, mechanical thrombectomy to left SFA and proximal popliteal artery - Local left groin surgical site clean no signs of infection - Continue aspirin  and statin   History of HFrEF Nonobstructive CAD status post recent LHC Ischemic cardiomyopathy - Euvolemic - Continue p.o. Lasix  - Continue ARB and spironolactone    PAF - In sinus rhythm, continue amiodarone  - Hold off Eliquis  in case patient will need surgical intervention.--Now on IV heparin  gtt   IDDM with severe PAD - Glucose 164 this morning - Cut down Lantus  to 15 units daily - SSI  Diarrhea since 1/22 recent antibiotic use -- patient is having type VII diarrheal stool -- elevated white count of 12 -- not on any stool softener or MiraLAX -- infectious disease curb sided regarding sending stool  for C. diff. Dr.  Searcy recommends hold off for now    Procedures: Family communication :none Consults : vascular, podiatry CODE STATUS: full DVT Prophylaxis : heparin  drip Level of care: Med-Surg Status is: Inpatient Remains inpatient appropriate because: infection of the amputation stump. Pending vascular consult    TOTAL TIME TAKING CARE OF THIS PATIENT: 40 minutes.  >50% time spent on counselling and coordination of care  Note: This dictation was prepared with Dragon dictation along with smaller phrase technology. Any transcriptional errors that result from this process are unintentional.  Leita Blanch M.D    [facility]   CC: Primary care physician; Norleen Lynwood ORN, MD

## 2024-09-02 NOTE — Progress Notes (Signed)
 PHARMACY - ANTICOAGULATION CONSULT NOTE  Pharmacy Consult for Heparin  Infusion Indication: atrial fibrillation  Allergies[1]  Patient Measurements: Height: 5' 8 (172.7 cm) Weight: 68 kg (150 lb) IBW/kg (Calculated) : 68.4 HEPARIN  DW (KG): 68  Vital Signs: Temp: 98.5 F (36.9 C) (01/23 0810) BP: 139/65 (01/23 0810) Pulse Rate: 72 (01/23 0810)  Labs: Recent Labs    08/30/24 1900 09/01/24 0838 09/01/24 0930 09/02/24 0557  HGB 11.6* 10.4*  --  10.1*  HCT 33.3* 30.9*  --  29.9*  PLT 463* 355  --  302  CREATININE 0.87  --  0.87 0.72    Estimated Creatinine Clearance: 86.2 mL/min (by C-G formula based on SCr of 0.72 mg/dL).   Medical History: Past Medical History:  Diagnosis Date   Adenomatous colon polyp    Allergic rhinitis    At risk for sleep apnea    STOP-BANG= 5   SENT TO PCP 03-14-2014   Bladder cancer (HCC)    CAD (coronary artery disease)    a. 07/2014 low risk MV; b. 07/2019 Cor CTA (FFR): LM 25-49 (nl), LAD mild prox/mid plaque (nl), D1 25-49p(nl w/ abnl FFR of 0.73 in inf branch), LCX/OM1 mild prox/mid plaque (nl), RCA nondominant, minimal Ca2+ plaque (nl), RPDA (mildly abnl @ 0.79)-->Med Rx..   Cataract    surgically removed bilateral   Condyloma acuminatum of penis    Diabetic neuropathy (HCC)    Diastolic dysfunction    a. 05/2019 Echo: EF 60-65%, no rwam, mod LVH, impaired relaxation, nl RV size/fxn, trace MR, triv TR.   Diverticulosis    GERD (gastroesophageal reflux disease)    History of bladder cancer    s/p  turbt  2013/   transitional cell carcinoma--    History of condyloma acuminatum    PERINEAL AREA  W/ RECURRENCY   History of gout    Hyperlipidemia    Hypertension    Hypoxemia 03/09/2024   Lower urinary tract symptoms (LUTS)    PAF (paroxysmal atrial fibrillation) (HCC)    a. 06/2019 Event monitor: PAF <1% burden. Longest 3 mins 36 secs.   Productive cough    PSVT (paroxysmal supraventricular tachycardia)    a. 06/2019 Event  monitor: 112 episodes of SVT, longest 21 secs.   PVD (peripheral vascular disease) with claudication    a. 03/2014 LE art duplex: long segment occlusion of mid to distal R SFA; b. 03/2020 ABI: nl left and mildly improved R ABI->med rx.   Renal artery stenosis    Renal artery stenosis    a. 12/2020 Renal art duplex: RRA 1-59%, LRA >60%.   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 He presented to ED with worsening left foot infection. His apixaban  for PAF was held upon admission for potential procedure with podiatry/vascular surgery and he was started on enoxaparin  for DVT prophylaxis. Given  CHA2DS2VASc Score of 5 (Age 59-74, HF, HTN, Vascular disease, DM), recommended the initiation  of heparin  infusion until procedure plan decided. Team agreed and pharmacy was consulted to initiate patient on a heparin  infusion.  Baseline Labs: INR 1.1 aPTT 37 HL 0.25 Hgb 10.1 PLT 302  No signs/symptoms of bleeding noted in chart.  Goal of Therapy:  Heparin  level 0.3-0.7 units/ml aPTT 66-102 seconds Monitor platelets by anticoagulation protocol: Yes   Plan:  Give 4000 unit bolus x 1 Start heparin  infusion at a rate of 1000 units/hr Check heparin  level in 6 hours after start of infusion Monitor CBC daily while on heparin  infusion Follow-up procedure plans and appropriate transition back   Lum VEAR Mania, PharmD, BCPS 09/02/2024,8:53 AM      [1]   Allergies Allergen Reactions   Cefepime  Hives    Had allergic reaction to one of these 3 agents, unclear which one   Metoprolol  Hives    Had allergic reaction to one of these 3 agents, unclear which one  *Per RN, highly likely this is the cause of allergic rxn   Myrbetriq  [Mirabegron ] Hives    Had allergic reaction to one of these 3 agents, unclear which one

## 2024-09-02 NOTE — Progress Notes (Addendum)
 Occupational Therapy Treatment Patient Details Name: [name] MRN: [id number] DOB: [birth year] Today's Date: 09/02/2024   History of present illness 69 year old male presents with falls, failure to thrive and worsening left foot TMA site cellulitis. Recently hospitalized for left transmetatarsal amputation 1 week ago as well as heart catheterization and left common femoral artery, profunda femoris, SFA endarterectomy and patch he angioplasty, mechanical thrombectomy to left SFA and proximal popliteal artery. 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   OT comments  Pt is supine in bed on arrival on bed pan. Reports LLE pain.  Pt performed rolling in bed with CGA for removal of bed pan and bed level peri-hygiene-reports multiple episodes of diarrhea this date. Pt required supervision for bed mobility and MIN A for STS to RW and short distance hopping on RLE around the bed to recliner to prevent posterior LOB.  Provided red theraband and written HEP with education on performance to maximize BUE strength for carryover to transfers and ADL performance. Pt left in recliner with all needs in place and will cont to require skilled acute OT services to maximize his safety and IND to return to PLOF. Appears pt is now pending BKA over the weekend and will need new orders/continue at transfer following this.       If plan is discharge home, recommend the following:  A little help with walking and/or transfers;A little help with bathing/dressing/bathroom;Help with stairs or ramp for entrance;Assistance with cooking/housework;A lot of help with bathing/dressing/bathroom;Assist for transportation   Equipment Recommendations  Other (comment) (defer)    Recommendations for Other Services      Precautions / Restrictions Precautions Precautions: Fall Recall of  Precautions/Restrictions: Impaired Restrictions Weight Bearing Restrictions Per Provider Order: Yes LLE Weight Bearing Per Provider Order: Non weight bearing LLE Partial Weight Bearing Percentage or Pounds: post op shoe       Mobility Bed Mobility Overal bed mobility: Needs Assistance Bed Mobility: Supine to Sit     Supine to sit: HOB elevated, Supervision Sit to supine: Supervision        Transfers Overall transfer level: Needs assistance Equipment used: Rolling walker (2 wheels) Transfers: Sit to/from Stand Sit to Stand: Min assist           General transfer comment: Min A and cues provided for safety and to prevent LOB posteriorly, able to hop on RLE around the bed to recliner ~10 ft     Balance Overall balance assessment: Needs assistance Sitting-balance support: Feet supported, No upper extremity supported Sitting balance-Leahy Scale: Fair     Standing balance support: Reliant on assistive device for balance, During functional activity, Bilateral upper extremity supported Standing balance-Leahy Scale: Fair Standing balance comment: Min A +RW to prevent LOB at times                           ADL either performed or assessed with clinical judgement   ADL Overall ADL's : Needs assistance/impaired                             Toileting- Clothing Manipulation and Hygiene: Minimal assistance;Bed level Toileting - Clothing Manipulation Details (indicate cue type and reason): peri-care d/t frequent diarrhea using bed pan at bed level with nursing staff     Functional mobility  during ADLs: Rolling walker (2 wheels);Minimal assistance      Extremity/Trunk Assessment              Vision       Perception     Praxis     Communication Communication Communication: No apparent difficulties Factors Affecting Communication: Hearing impaired   Cognition Arousal: Alert Behavior During Therapy: WFL for tasks assessed/performed                                  Following commands: Intact        Cueing   Cueing Techniques: Verbal cues  Exercises Other Exercises Other Exercises: Continued edu on use of post op shoe and weight bearing precautions as well as use of call bell for assist with transfers.    Shoulder Instructions       General Comments ace wrap and post op shoe to LLE    Pertinent Vitals/ Pain       Pain Assessment Pain Assessment: 0-10 Faces Pain Scale: Hurts little more Pain Location: LLE Pain Descriptors / Indicators: Discomfort Pain Intervention(s): Monitored during session, Repositioned, Limited activity within patient's tolerance  Home Living                                          Prior Functioning/Environment              Frequency  Min 2X/week        Progress Toward Goals  OT Goals(current goals can now be found in the care plan section)  Progress towards OT goals: Progressing toward goals  Acute Rehab OT Goals Patient Stated Goal: get better OT Goal Formulation: With patient Time For Goal Achievement: 09/15/24 Potential to Achieve Goals: Fair  Plan      Co-evaluation                 AM-PAC OT 6 Clicks Daily Activity     Outcome Measure   Help from another person eating meals?: A Little Help from another person taking care of personal grooming?: A Little Help from another person toileting, which includes using toliet, bedpan, or urinal?: A Lot Help from another person bathing (including washing, rinsing, drying)?: A Lot Help from another person to put on and taking off regular upper body clothing?: A Little Help from another person to put on and taking off regular lower body clothing?: A Lot 6 Click Score: 15    End of Session Equipment Utilized During Treatment: Rolling walker (2 wheels)  OT Visit Diagnosis: Other abnormalities of gait and mobility (R26.89);Muscle weakness (generalized) (M62.81)   Activity Tolerance  Patient tolerated treatment well   Patient Left with call bell/phone within reach;in chair;with chair alarm set   Nurse Communication Mobility status        Time: 8475-8447 OT Time Calculation (min): 28 min  Charges: OT General Charges $OT Visit: 1 Visit OT Treatments $Self Care/Home Management : 8-22 mins $Therapeutic Exercise: 8-22 mins  [name] Chrismon, OTR/L  09/02/24, 5:13 PM   Rupal Childress E Chrismon 09/02/2024, 5:09 PM

## 2024-09-02 NOTE — Progress Notes (Signed)
 PHARMACY - ANTICOAGULATION CONSULT NOTE  Pharmacy Consult for Heparin  Infusion Indication: atrial fibrillation  Allergies[1]  Patient Measurements: Height: 5' 8 (172.7 cm) Weight: 68 kg (150 lb) IBW/kg (Calculated) : 68.4 HEPARIN  DW (KG): 68  Vital Signs: Temp: 97.8 F (36.6 C) (01/23 1610) BP: 144/78 (01/23 1610) Pulse Rate: 70 (01/23 1610)  Labs: Recent Labs    08/30/24 1900 09/01/24 0838 09/01/24 0930 09/02/24 0557 09/02/24 1008 09/02/24 1819  HGB 11.6* 10.4*  --  10.1*  --   --   HCT 33.3* 30.9*  --  29.9*  --   --   PLT 463* 355  --  302  --   --   APTT  --   --   --   --  37*  --   LABPROT  --   --   --   --  14.5  --   INR  --   --   --   --  1.1  --   HEPARINUNFRC  --   --   --   --  0.25* 0.17*  CREATININE 0.87  --  0.87 0.72  --   --     Estimated Creatinine Clearance: 86.2 mL/min (by C-G formula based on SCr of 0.72 mg/dL).   Medical History: Past Medical History:  Diagnosis Date   Adenomatous colon polyp    Allergic rhinitis    At risk for sleep apnea    STOP-BANG= 5   SENT TO PCP 03-14-2014   Bladder cancer (HCC)    CAD (coronary artery disease)    a. 07/2014 low risk MV; b. 07/2019 Cor CTA (FFR): LM 25-49 (nl), LAD mild prox/mid plaque (nl), D1 25-49p(nl w/ abnl FFR of 0.73 in inf branch), LCX/OM1 mild prox/mid plaque (nl), RCA nondominant, minimal Ca2+ plaque (nl), RPDA (mildly abnl @ 0.79)-->Med Rx..   Cataract    surgically removed bilateral   Condyloma acuminatum of penis    Diabetic neuropathy (HCC)    Diastolic dysfunction    a. 05/2019 Echo: EF 60-65%, no rwam, mod LVH, impaired relaxation, nl RV size/fxn, trace MR, triv TR.   Diverticulosis    GERD (gastroesophageal reflux disease)    History of bladder cancer    s/p  turbt  2013/   transitional cell carcinoma--    History of condyloma acuminatum    PERINEAL AREA  W/ RECURRENCY   History of gout    Hyperlipidemia    Hypertension    Hypoxemia 03/09/2024   Lower urinary tract  symptoms (LUTS)    PAF (paroxysmal atrial fibrillation) (HCC)    a. 06/2019 Event monitor: PAF <1% burden. Longest 3 mins 36 secs.   Productive cough    PSVT (paroxysmal supraventricular tachycardia)    a. 06/2019 Event monitor: 112 episodes of SVT, longest 21 secs.   PVD (peripheral vascular disease) with claudication    a. 03/2014 LE art duplex: long segment occlusion of mid to distal R SFA; b. 03/2020 ABI: nl left and mildly improved R ABI->med rx.   Renal artery stenosis    Renal artery stenosis    a. 12/2020 Renal art duplex: RRA 1-59%, LRA >60%.   Smokers' cough (HCC)    Type 2 diabetes mellitus with insulin  therapy (HCC) 1992   monitor by  dr ellsion   Wears dentures    upper    Medications:  Apixaban  5 mg PO twice daily - last dose was 1/20 Enoxaparin  40 mg SQ given 1/22 @ 2239  Assessment:  Patient is a 69 year old male with a past medical history of PAD with recent thrombectomy to L SFA and proximal popliteal artery, left foot osteomyelitis status post transmetatarsal amputation on August 18, 2024, IDDM, HTN, nonobstructive CAD, ischemic cardiomyopathy, chronic HFrEF with LVEF 30-35%, PAF on Eliquis , and bladder cancer. He presented to ED with worsening left foot infection. His apixaban  for PAF was held upon admission for potential procedure with podiatry/vascular surgery and he was started on enoxaparin  for DVT prophylaxis. Given  CHA2DS2VASc Score of 5 (Age 51-74, HF, HTN, Vascular disease, DM), recommended the initiation of heparin  infusion until procedure plan decided. Team agreed and pharmacy was consulted to initiate patient on a heparin  infusion.  Baseline Labs: INR 1.1 aPTT 37 HL 0.25 Hgb 10.1 PLT 302  No signs/symptoms of bleeding noted in chart.  Goal of Therapy:  Heparin  level 0.3-0.7 units/ml aPTT 66-102 seconds Monitor platelets by anticoagulation protocol: Yes  1/23@1819 : HL 0.17, Subtherapeutic@1000  units/hr   Plan:  Give 2000 unit bolus x 1 Increase  heparin  infusion to a rate of 1250 units/hr Check heparin  level in 6 hours after rate change Monitor CBC daily while on heparin  infusion Follow-up procedure plans and appropriate transition back   Corlene Sabia A Fatou Dunnigan, PharmD Clinical Pharmacist 09/02/2024 6:55 PM        [1]  Allergies Allergen Reactions   Cefepime  Hives    Had allergic reaction to one of these 3 agents, unclear which one   Metoprolol  Hives    Had allergic reaction to one of these 3 agents, unclear which one  *Per RN, highly likely this is the cause of allergic rxn   Myrbetriq  [Mirabegron ] Hives    Had allergic reaction to one of these 3 agents, unclear which one

## 2024-09-03 ENCOUNTER — Inpatient Hospital Stay: Admitting: Anesthesiology

## 2024-09-03 ENCOUNTER — Encounter
Admission: EM | Disposition: A | Discharge status: Skilled Nursing Facility | Payer: Self-pay | Source: Home / Self Care | Attending: Internal Medicine

## 2024-09-03 DIAGNOSIS — R197 Diarrhea, unspecified: Secondary | ICD-10-CM | POA: Diagnosis not present

## 2024-09-03 DIAGNOSIS — R627 Adult failure to thrive: Secondary | ICD-10-CM | POA: Diagnosis not present

## 2024-09-03 DIAGNOSIS — L03116 Cellulitis of left lower limb: Secondary | ICD-10-CM | POA: Diagnosis not present

## 2024-09-03 DIAGNOSIS — I739 Peripheral vascular disease, unspecified: Secondary | ICD-10-CM | POA: Diagnosis not present

## 2024-09-03 LAB — CBC
HCT: 28.2 % — ABNORMAL LOW (ref 39.0–52.0)
Hemoglobin: 9.6 g/dL — ABNORMAL LOW (ref 13.0–17.0)
MCH: 31.9 pg (ref 26.0–34.0)
MCHC: 34 g/dL (ref 30.0–36.0)
MCV: 93.7 fL (ref 80.0–100.0)
Platelets: 275 10*3/uL (ref 150–400)
RBC: 3.01 MIL/uL — ABNORMAL LOW (ref 4.22–5.81)
RDW: 13.7 % (ref 11.5–15.5)
WBC: 13.8 10*3/uL — ABNORMAL HIGH (ref 4.0–10.5)
nRBC: 0 % (ref 0.0–0.2)

## 2024-09-03 LAB — HEPARIN LEVEL (UNFRACTIONATED)
Heparin Unfractionated: 0.11 [IU]/mL — ABNORMAL LOW (ref 0.30–0.70)
Heparin Unfractionated: 0.11 [IU]/mL — ABNORMAL LOW (ref 0.30–0.70)

## 2024-09-03 LAB — GLUCOSE, CAPILLARY
Glucose-Capillary: 221 mg/dL — ABNORMAL HIGH (ref 70–99)
Glucose-Capillary: 171 mg/dL — ABNORMAL HIGH (ref 70–99)
Glucose-Capillary: 140 mg/dL — ABNORMAL HIGH (ref 70–99)
Glucose-Capillary: 190 mg/dL — ABNORMAL HIGH (ref 70–99)
Glucose-Capillary: 208 mg/dL — ABNORMAL HIGH (ref 70–99)
Glucose-Capillary: 340 mg/dL — ABNORMAL HIGH (ref 70–99)

## 2024-09-03 MED ORDER — EPHEDRINE SULFATE-NACL 50-0.9 MG/10ML-% IV SOSY
PREFILLED_SYRINGE | INTRAVENOUS | Status: DC | PRN
  Administered 2024-09-03: 10 mg via INTRAVENOUS
  Administered 2024-09-03: 5 mg via INTRAVENOUS

## 2024-09-03 MED ORDER — ONDANSETRON HCL 4 MG/2ML IJ SOLN
INTRAMUSCULAR | Status: AC
  Filled 2024-09-03: qty 2

## 2024-09-03 MED ORDER — 0.9 % SODIUM CHLORIDE (POUR BTL) OPTIME
TOPICAL | Status: DC | PRN
  Administered 2024-09-03: 1000 mL

## 2024-09-03 MED ORDER — KETAMINE HCL 50 MG/5ML IJ SOSY
PREFILLED_SYRINGE | INTRAMUSCULAR | Status: AC
  Filled 2024-09-03: qty 5

## 2024-09-03 MED ORDER — PROPOFOL 10 MG/ML IV BOLUS
INTRAVENOUS | Status: AC
  Filled 2024-09-03: qty 20

## 2024-09-03 MED ORDER — PROPOFOL 10 MG/ML IV BOLUS
INTRAVENOUS | Status: DC | PRN
  Administered 2024-09-03: 150 mg via INTRAVENOUS
  Administered 2024-09-03: 50 mg via INTRAVENOUS

## 2024-09-03 MED ORDER — HYDROMORPHONE HCL 1 MG/ML IJ SOLN
0.2500 mg | INTRAMUSCULAR | Status: DC | PRN
  Administered 2024-09-03 (×4): 0.5 mg via INTRAVENOUS

## 2024-09-03 MED ORDER — MIDAZOLAM HCL (PF) 2 MG/2ML IJ SOLN
INTRAMUSCULAR | Status: DC | PRN
  Administered 2024-09-03: 2 mg via INTRAVENOUS

## 2024-09-03 MED ORDER — INSULIN GLARGINE 100 UNIT/ML ~~LOC~~ SOLN
20.0000 [IU] | Freq: Every day | SUBCUTANEOUS | Status: DC
  Filled 2024-09-03: qty 0.2

## 2024-09-03 MED ORDER — FENTANYL CITRATE (PF) 100 MCG/2ML IJ SOLN
INTRAMUSCULAR | Status: DC | PRN
  Administered 2024-09-03: 25 ug via INTRAVENOUS
  Administered 2024-09-03: 50 ug via INTRAVENOUS
  Administered 2024-09-03: 25 ug via INTRAVENOUS

## 2024-09-03 MED ORDER — OXYCODONE HCL 5 MG/5ML PO SOLN: 5.0000 mg | Freq: Once | ORAL | Status: AC | PRN

## 2024-09-03 MED ORDER — HYDROMORPHONE HCL 1 MG/ML IJ SOLN
INTRAMUSCULAR | Status: AC
  Filled 2024-09-03: qty 1

## 2024-09-03 MED ORDER — HYDROMORPHONE HCL 1 MG/ML IJ SOLN
0.5000 mg | INTRAMUSCULAR | Status: AC | PRN
  Administered 2024-09-03 (×2): 0.5 mg via INTRAVENOUS

## 2024-09-03 MED ORDER — OXYCODONE HCL 5 MG PO TABS
ORAL_TABLET | ORAL | Status: AC
  Filled 2024-09-03: qty 1

## 2024-09-03 MED ORDER — HEPARIN BOLUS VIA INFUSION
2300.0000 [IU] | Freq: Once | INTRAVENOUS | Status: AC
  Administered 2024-09-03: 2300 [IU] via INTRAVENOUS
  Filled 2024-09-03: qty 2300

## 2024-09-03 MED ORDER — HEPARIN BOLUS VIA INFUSION
2000.0000 [IU] | Freq: Once | INTRAVENOUS | Status: DC
  Filled 2024-09-03: qty 2000

## 2024-09-03 MED ORDER — MIDAZOLAM HCL 2 MG/2ML IJ SOLN
INTRAMUSCULAR | Status: AC
  Filled 2024-09-03: qty 2

## 2024-09-03 MED ORDER — LIDOCAINE HCL (CARDIAC) PF 100 MG/5ML IV SOSY
PREFILLED_SYRINGE | INTRAVENOUS | Status: DC | PRN
  Administered 2024-09-03: 80 mg via INTRAVENOUS

## 2024-09-03 MED ORDER — PHENYLEPHRINE 80 MCG/ML (10ML) SYRINGE FOR IV PUSH (FOR BLOOD PRESSURE SUPPORT)
PREFILLED_SYRINGE | INTRAVENOUS | Status: DC | PRN
  Administered 2024-09-03: 160 ug via INTRAVENOUS
  Administered 2024-09-03: 80 ug via INTRAVENOUS

## 2024-09-03 MED ORDER — OXYCODONE HCL 5 MG PO TABS
5.0000 mg | ORAL_TABLET | Freq: Once | ORAL | Status: AC | PRN
  Administered 2024-09-03: 5 mg via ORAL

## 2024-09-03 MED ORDER — SODIUM CHLORIDE 0.9 % IV SOLN: INTRAVENOUS | Status: DC | PRN

## 2024-09-03 MED ORDER — HEPARIN (PORCINE) 25000 UT/250ML-% IV SOLN
INTRAVENOUS | Status: AC
  Administered 2024-09-03: 1500 [IU]/h via INTRAVENOUS
  Filled 2024-09-03: qty 250

## 2024-09-03 MED ORDER — DEXAMETHASONE SOD PHOSPHATE PF 10 MG/ML IJ SOLN
INTRAMUSCULAR | Status: DC | PRN
  Administered 2024-09-03: 8 mg via INTRAVENOUS

## 2024-09-03 MED ORDER — VASHE WOUND IRRIGATION OPTIME
TOPICAL | Status: DC | PRN
  Administered 2024-09-03: 34 [oz_av]

## 2024-09-03 MED ORDER — KETAMINE HCL 50 MG/5ML IJ SOSY
PREFILLED_SYRINGE | INTRAMUSCULAR | Status: DC | PRN
  Administered 2024-09-03: 30 mg via INTRAVENOUS

## 2024-09-03 MED ORDER — DEXAMETHASONE SOD PHOSPHATE PF 10 MG/ML IJ SOLN
INTRAMUSCULAR | Status: AC
  Filled 2024-09-03: qty 1

## 2024-09-03 MED ORDER — ONDANSETRON HCL 4 MG/2ML IJ SOLN: 4.0000 mg | Freq: Once | INTRAMUSCULAR | Status: DC | PRN

## 2024-09-03 MED ORDER — PHENYLEPHRINE 80 MCG/ML (10ML) SYRINGE FOR IV PUSH (FOR BLOOD PRESSURE SUPPORT)
PREFILLED_SYRINGE | INTRAVENOUS | Status: AC
  Filled 2024-09-03: qty 10

## 2024-09-03 MED ORDER — ONDANSETRON HCL 4 MG/2ML IJ SOLN
INTRAMUSCULAR | Status: DC | PRN
  Administered 2024-09-03: 4 mg via INTRAVENOUS

## 2024-09-03 MED ORDER — FENTANYL CITRATE (PF) 100 MCG/2ML IJ SOLN
INTRAMUSCULAR | Status: AC
  Filled 2024-09-03: qty 2

## 2024-09-03 MED ORDER — LIDOCAINE HCL (PF) 2 % IJ SOLN
INTRAMUSCULAR | Status: AC
  Filled 2024-09-03: qty 5

## 2024-09-03 MED ORDER — MORPHINE SULFATE (PF) 2 MG/ML IV SOLN
2.0000 mg | Freq: Four times a day (QID) | INTRAVENOUS | Status: DC | PRN
  Administered 2024-09-03 – 2024-09-04 (×2): 3 mg via INTRAVENOUS
  Administered 2024-09-05 – 2024-09-06 (×2): 2 mg via INTRAVENOUS
  Filled 2024-09-03: qty 2
  Filled 2024-09-03: qty 1
  Filled 2024-09-03 (×2): qty 2

## 2024-09-03 NOTE — Progress Notes (Signed)
 PT Cancellation Note  Patient Details Name: [name] MRN: [id number] DOB: 13-Apr-1956   Cancelled Treatment:    Reason Eval/Treat Not Completed: Other (comment)  Offered and encouraged session.  Pt stated amputation is being scheduled.  Offered and encouraged mobility pre-op to maximize strength and comfort.  Pt declined stating he was as strong as I am going to be.  Stated he has not been doing any HEP and is encouraged to do so.  Declined OOB to chair or other interventions at this time.  Will continue as appropriate at a later time/date.   Lauraine Gills 09/03/2024, 8:27 AM

## 2024-09-03 NOTE — Op Note (Signed)
 "  OPERATIVE NOTE   PROCEDURE: Left below-the-knee amputation  PRE-OPERATIVE DIAGNOSIS: Left foot gangrene  POST-OPERATIVE DIAGNOSIS: same as above  SURGEON: Paul Pereyra, MD  ASSISTANT(S): None  ANESTHESIA: general  ESTIMATED BLOOD LOSS: 300 cc  FINDING(S): He has excellent perfusion with pulsatile arterial bleeding even with tourniquet at 250 mm Hg and we turned it up to 300 with no improvement.  It was up a total of 44 minutes.  SPECIMEN(S):  Left below-the-knee amputation  INDICATIONS:   [name] is a 69 y.o. male who presents with left foot gangrene.  The patient is scheduled for a left below-the-knee amputation.  I discussed in depth with the patient the risks, benefits, and alternatives to this procedure.  The patient is aware that the risk of this operation included but are not limited to:  bleeding, infection, myocardial infarction, stroke, death, failure to heal amputation wound, and possible need for more proximal amputation.  The patient is aware of the risks and agrees proceed forward with the procedure.  DESCRIPTION:  After full informed written consent was obtained from the patient, the patient was brought back to the operating room, and placed supine upon the operating table.  Prior to induction, the patient received IV antibiotics.  The patient was then prepped and draped in the standard fashion for a below-the-knee amputation.  After obtaining adequate anesthesia, the patient was prepped and draped in the standard fashion for a left below-the-knee amputation.  I marked out the anterior incision two finger breadths below the tibial tuberosity and then the marked out a posterior flap that was one third of the circumference of the calf in length.   I made the incisions for these flaps, and then dissected through the subcutaneous tissue, fascia, and muscle anteriorly.  I elevated  the periosteal tissue superiorly so that the tibia was about 3-4 cm shorter than the anterior  skin flap.  I then transected the tibia with a power saw and then took a wedge off the tibia anteriorly with the power saw.  Then I smoothed out the rough edges.  In a similar fashion, I cut back the fibula about two centimeters higher than the level of the tibia with a bone cutter.  I put a bone hook into the distal tibia and then used a large amputation knife to sharply develop a tissue plane through the muscle along the fibula.  In such fashion, the posterior flap was developed.  At this point, the specimen was passed off the field as the below-the-knee amputation.  At this point, I clamped all visibly bleeding arteries and veins using a combination of suture ligation with 2-0 Monocryl suture, 2-0 silk ties and electrocautery.  Bleeding continued to be controlled with electrocautery and suture ligature.  The stump was washed off with Vashe and no further active bleeding was noted.  I reapproximated the anterior and posterior fascia  with interrupted stitches of 2-0 Monocryl.  This was completed along the entire length of anterior and posterior fascia until there were no more loose space in the fascial line. I then placed a layer of 2-0 Monocryl sutures in the subcutaneous tissue. The skin was then  reapproximated with staples.  The stump was washed off and dried.  The incision was dressed with Xeroform and  then fluffs were applied.  Kerlix was wrapped around the leg and then gently a Coban wrap was applied.  All was secured with an Ioban overdressing.  He had pulsatile arterial bleeding with the  tourniquet at 250 mm Hg and I increased it to 300 mm Hg with no improvement.  Muscle was viable and hemostasis with sutures and cautery was eventually successful.  The tourniquet was released after 44 minutes and before the final hemostasis was secured and the wound was washed with Vashe.  COMPLICATIONS: none  CONDITION: stable   [name]  09/03/2024, 3:31 PM    "

## 2024-09-03 NOTE — Plan of Care (Signed)

## 2024-09-03 NOTE — Transfer of Care (Signed)
 Immediate Anesthesia Transfer of Care Note  Patient: [name]  Procedure(s) Performed: AMPUTATION BELOW KNEE (Left: Knee)  Patient Location: PACU  Anesthesia Type:General  Level of Consciousness: awake  Airway & Oxygen Therapy: Patient Spontanous Breathing and Patient connected to face mask oxygen  Post-op Assessment: Report given to RN and Post -op Vital signs reviewed and stable  Post vital signs: Reviewed and stable  Last Vitals:  Vitals Value Taken Time  BP 142/61 09/03/24 14:45  Temp 36.4 C 09/03/24 14:45  Pulse 63 09/03/24 14:54  Resp 16 09/03/24 14:54  SpO2 100 % 09/03/24 14:54  Vitals shown include unfiled device data.  Last Pain:  Vitals:   09/03/24 1445  TempSrc:   PainSc: Asleep      Patients Stated Pain Goal: 0 (09/02/24 2112)  Complications: No notable events documented.

## 2024-09-03 NOTE — Anesthesia Procedure Notes (Signed)
 Procedure Name: LMA Insertion Date/Time: 09/03/2024 1:00 PM  Performed by: Jaylene Nest, CRNAPre-anesthesia Checklist: Patient identified, Patient being monitored, Timeout performed, Emergency Drugs available and Suction available Patient Re-evaluated:Patient Re-evaluated prior to induction Oxygen Delivery Method: Circle system utilized Preoxygenation: Pre-oxygenation with 100% oxygen Induction Type: IV induction Ventilation: Mask ventilation without difficulty LMA: LMA inserted LMA Size: 4.0 Tube type: Oral Number of attempts: 1 Placement Confirmation: positive ETCO2 and breath sounds checked- equal and bilateral Tube secured with: Tape Dental Injury: Teeth and Oropharynx as per pre-operative assessment

## 2024-09-03 NOTE — Progress Notes (Signed)
 [facility]  Daily Progress Note   Subjective  -   He has moderate pain in the left foot remnant.  There is drainage and odor from the non-healed incision line.  He denies fever or chills.  He does state that he thinks the foot cannot be saved and there is no point in delaying the definitive procedure and he may as well start healing that operative site.  He does accept the need to remove the foot (BKA).  He is OK with proceeding today.  Objective Vitals:   09/02/24 1910 09/02/24 2112 09/03/24 0308 09/03/24 0716  BP: 133/66  (!) 151/72 (!) 150/59  Pulse: 65  71 63  Resp: 16  18 16   Temp: 98 F (36.7 C)  98.9 F (37.2 C) 98.6 F (37 C)  TempSrc: Oral  Oral   SpO2: 99%  96% 97%  Weight:  78.1 kg    Height:  5' 8 (1.727 m)      Intake/Output Summary (Last 24 hours) at 09/03/2024 0953 Last data filed at 09/03/2024 0707 Gross per 24 hour  Intake 3779.74 ml  Output 1050 ml  Net 2729.74 ml    PULM  CTAB CV  RRR VASC  Palpable DP pulse on Left.  Groin incision looks clean.  Left TMA incision has necrotic and liquified areas of skin all along the length.  There is malodor and drainage.  It is boggy and warm.  Tenderness is mild.  Laboratory CBC    Component Value Date/Time   WBC 13.8 (H) 09/03/2024 0150   HGB 9.6 (L) 09/03/2024 0150   HCT 28.2 (L) 09/03/2024 0150   PLT 275 09/03/2024 0150    BMET    Component Value Date/Time   NA 138 09/02/2024 0557   NA 138 06/16/2019 1144   K 4.1 09/02/2024 0557   CL 103 09/02/2024 0557   CO2 27 09/02/2024 0557   GLUCOSE 184 (H) 09/02/2024 0557   BUN 15 09/02/2024 0557   BUN 15 06/16/2019 1144   CREATININE 0.72 09/02/2024 0557   CALCIUM  8.6 (L) 09/02/2024 0557   GFRNONAA >60 09/02/2024 0557   GFRAA >60 02/29/2020 9062    Assessment/Planning: POD #18  s/p L TMA  His arterial revascularization is doing well but the TMA is failed.  Both Podiatry and Vascular Surgery think he needs BKA.  MRI is suggestive  of osteo and shows a fluid collection that is likely abscess.  WBC persistently elevated despite ABX. He is agreeable to left BKA today.  He is marked and ready and understands the plan and option (continued refusal) and potential for complications from BKA.  He has excellent perfusion but is malnourished and has ongoing infection which increase his chance for BKA infection.  I do not think we need to to do this in 2 stages but it was a consideration.  I have put him on the OR schedule today.  Breakfast was held and he has been NPO since last night.  Norleen LITTIE Pereyra  09/03/2024, 9:53 AM

## 2024-09-03 NOTE — Progress Notes (Signed)
 PHARMACY - ANTICOAGULATION CONSULT NOTE  Pharmacy Consult for Heparin  Infusion Indication: atrial fibrillation  Allergies[1]  Patient Measurements: Height: 5' 8 (172.7 cm) Weight: 78.1 kg (172 lb 2.9 oz) IBW/kg (Calculated) : 68.4 HEPARIN  DW (KG): 78.1  Labs: Recent Labs    09/01/24 0838 09/01/24 0930 09/02/24 0557 09/02/24 1008 09/02/24 1008 09/02/24 1819 09/03/24 0150 09/03/24 0901  HGB 10.4*  --  10.1*  --   --   --  9.6*  --   HCT 30.9*  --  29.9*  --   --   --  28.2*  --   PLT 355  --  302  --   --   --  275  --   APTT  --   --   --  37*  --   --   --   --   LABPROT  --   --   --  14.5  --   --   --   --   INR  --   --   --  1.1  --   --   --   --   HEPARINUNFRC  --   --   --  0.25*   < > 0.17* 0.11* 0.11*  CREATININE  --  0.87 0.72  --   --   --   --   --    < > = values in this interval not displayed.    Estimated Creatinine Clearance: 86.7 mL/min (by C-G formula based on SCr of 0.72 mg/dL).   Medical History: Past Medical History:  Diagnosis Date   Adenomatous colon polyp    Allergic rhinitis    At risk for sleep apnea    STOP-BANG= 5   SENT TO PCP 03-14-2014   Bladder cancer (HCC)    CAD (coronary artery disease)    a. 07/2014 low risk MV; b. 07/2019 Cor CTA (FFR): LM 25-49 (nl), LAD mild prox/mid plaque (nl), D1 25-49p(nl w/ abnl FFR of 0.73 in inf branch), LCX/OM1 mild prox/mid plaque (nl), RCA nondominant, minimal Ca2+ plaque (nl), RPDA (mildly abnl @ 0.79)-->Med Rx..   Cataract    surgically removed bilateral   Condyloma acuminatum of penis    Diabetic neuropathy (HCC)    Diastolic dysfunction    a. 05/2019 Echo: EF 60-65%, no rwam, mod LVH, impaired relaxation, nl RV size/fxn, trace MR, triv TR.   Diverticulosis    GERD (gastroesophageal reflux disease)    History of bladder cancer    s/p  turbt  2013/   transitional cell carcinoma--    History of condyloma acuminatum    PERINEAL AREA  W/ RECURRENCY   History of gout    Hyperlipidemia     Hypertension    Hypoxemia 03/09/2024   Lower urinary tract symptoms (LUTS)    PAF (paroxysmal atrial fibrillation) (HCC)    a. 06/2019 Event monitor: PAF <1% burden. Longest 3 mins 36 secs.   Productive cough    PSVT (paroxysmal supraventricular tachycardia)    a. 06/2019 Event monitor: 112 episodes of SVT, longest 21 secs.   PVD (peripheral vascular disease) with claudication    a. 03/2014 LE art duplex: long segment occlusion of mid to distal R SFA; b. 03/2020 ABI: nl left and mildly improved R ABI->med rx.   Renal artery stenosis    Renal artery stenosis    a. 12/2020 Renal art duplex: RRA 1-59%, LRA >60%.   Smokers' cough (HCC)    Type 2 diabetes mellitus with  insulin  therapy (HCC) 1992   monitor by  dr ellsion   Wears dentures    upper    Medications:  Apixaban  5 mg PO twice daily - last dose was 1/20 Enoxaparin  40 mg SQ given 1/22 @ 2239  Assessment: Patient is a 69 year old male with a past medical history of PAD with recent thrombectomy to L SFA and proximal popliteal artery, left foot osteomyelitis status post transmetatarsal amputation on August 18, 2024, IDDM, HTN, nonobstructive CAD, ischemic cardiomyopathy, chronic HFrEF with LVEF 30-35%, PAF on Eliquis , and bladder cancer. He presented to ED with worsening left foot infection. His apixaban  for PAF was held upon admission for potential procedure with podiatry/vascular surgery and he was started on enoxaparin  for DVT prophylaxis. Given  CHA2DS2VASc Score of 5 (Age 40-74, HF, HTN, Vascular disease, DM), recommended the initiation of heparin  infusion until procedure plan decided. Team agreed and pharmacy was consulted to initiate patient on a heparin  infusion.  Baseline Labs: INR 1.1 aPTT 37 HL 0.25 Hgb 10.1 PLT 302  No signs/symptoms of bleeding noted in chart.  Goal of Therapy:  Heparin  level 0.3-0.7 units/ml Monitor platelets by anticoagulation protocol: Yes  1/23 1819: HL 0.17, Subtherapeutic@1000  units/hr 1/24  0150 HL 0.11, subthera; 1250 un/hr 1/24 0901 HL 0.11, subthera; 1500 un/hr   Plan:  --Heparin  2000 unit IV bolus and increase heparin  infusion rate to 1800 units/hr --Re-check HL 6 hours from rate change --Daily CBC per protocol while on IV heparin   Marolyn KATHEE Mare 09/03/2024 10:02 AM    [1]  Allergies Allergen Reactions   Cefepime  Hives    Had allergic reaction to one of these 3 agents, unclear which one   Metoprolol  Hives    Had allergic reaction to one of these 3 agents, unclear which one  *Per RN, highly likely this is the cause of allergic rxn   Myrbetriq  [Mirabegron ] Hives    Had allergic reaction to one of these 3 agents, unclear which one

## 2024-09-03 NOTE — Progress Notes (Signed)
 PHARMACY - ANTICOAGULATION CONSULT NOTE  Pharmacy Consult for Heparin  Infusion Indication: atrial fibrillation  Allergies[1]  Patient Measurements: Height: 5' 8 (172.7 cm) Weight: 78.1 kg (172 lb 2.9 oz) IBW/kg (Calculated) : 68.4 HEPARIN  DW (KG): 78.1  Vital Signs: Temp: 98 F (36.7 C) (01/23 1910) Temp Source: Oral (01/23 1910) BP: 133/66 (01/23 1910) Pulse Rate: 65 (01/23 1910)  Labs: Recent Labs    09/01/24 0838 09/01/24 0930 09/02/24 0557 09/02/24 1008 09/02/24 1819 09/03/24 0150  HGB 10.4*  --  10.1*  --   --  9.6*  HCT 30.9*  --  29.9*  --   --  28.2*  PLT 355  --  302  --   --  275  APTT  --   --   --  37*  --   --   LABPROT  --   --   --  14.5  --   --   INR  --   --   --  1.1  --   --   HEPARINUNFRC  --   --   --  0.25* 0.17* 0.11*  CREATININE  --  0.87 0.72  --   --   --     Estimated Creatinine Clearance: 86.7 mL/min (by C-G formula based on SCr of 0.72 mg/dL).   Medical History: Past Medical History:  Diagnosis Date   Adenomatous colon polyp    Allergic rhinitis    At risk for sleep apnea    STOP-BANG= 5   SENT TO PCP 03-14-2014   Bladder cancer (HCC)    CAD (coronary artery disease)    a. 07/2014 low risk MV; b. 07/2019 Cor CTA (FFR): LM 25-49 (nl), LAD mild prox/mid plaque (nl), D1 25-49p(nl w/ abnl FFR of 0.73 in inf branch), LCX/OM1 mild prox/mid plaque (nl), RCA nondominant, minimal Ca2+ plaque (nl), RPDA (mildly abnl @ 0.79)-->Med Rx..   Cataract    surgically removed bilateral   Condyloma acuminatum of penis    Diabetic neuropathy (HCC)    Diastolic dysfunction    a. 05/2019 Echo: EF 60-65%, no rwam, mod LVH, impaired relaxation, nl RV size/fxn, trace MR, triv TR.   Diverticulosis    GERD (gastroesophageal reflux disease)    History of bladder cancer    s/p  turbt  2013/   transitional cell carcinoma--    History of condyloma acuminatum    PERINEAL AREA  W/ RECURRENCY   History of gout    Hyperlipidemia    Hypertension     Hypoxemia 03/09/2024   Lower urinary tract symptoms (LUTS)    PAF (paroxysmal atrial fibrillation) (HCC)    a. 06/2019 Event monitor: PAF <1% burden. Longest 3 mins 36 secs.   Productive cough    PSVT (paroxysmal supraventricular tachycardia)    a. 06/2019 Event monitor: 112 episodes of SVT, longest 21 secs.   PVD (peripheral vascular disease) with claudication    a. 03/2014 LE art duplex: long segment occlusion of mid to distal R SFA; b. 03/2020 ABI: nl left and mildly improved R ABI->med rx.   Renal artery stenosis    Renal artery stenosis    a. 12/2020 Renal art duplex: RRA 1-59%, LRA >60%.   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 He presented to ED with worsening left foot infection. His apixaban  for PAF was held upon admission for potential procedure with podiatry/vascular surgery and he was started on enoxaparin  for DVT prophylaxis. Given  CHA2DS2VASc Score of 5 (Age 37-74, HF, HTN, Vascular disease, DM), recommended the initiation of heparin  infusion until procedure plan decided. Team agreed and pharmacy was consulted to initiate patient on a heparin  infusion.  Baseline Labs: INR 1.1 aPTT 37 HL 0.25 Hgb 10.1 PLT 302  No signs/symptoms of bleeding noted in chart.  Goal of Therapy:  Heparin  level 0.3-0.7 units/ml aPTT 66-102 seconds Monitor platelets by anticoagulation protocol: Yes  1/23@1819 : HL 0.17, Subtherapeutic@1000  units/hr 1/24  0150 HL 0.11, subtherapeutic   Plan:  Give 2300 unit bolus x 1 Increase heparin  infusion to a rate of 1500 units/hr Check heparin  level in 6 hours after rate change Monitor CBC daily while on heparin  infusion Follow-up procedure plans and appropriate transition back   Rankin CANDIE Dills, PharmD, [facility] 09/03/2024 2:31 AM          [1]  Allergies Allergen Reactions   Cefepime  Hives    Had allergic reaction to one of these 3 agents, unclear which one   Metoprolol  Hives    Had allergic reaction to one of these 3 agents, unclear which one  *Per RN, highly likely this is the cause of allergic rxn   Myrbetriq  [Mirabegron ] Hives    Had allergic reaction to one of these 3 agents, unclear which one

## 2024-09-03 NOTE — Anesthesia Postprocedure Evaluation (Signed)
"   Anesthesia Post Note  Patient: [name]  Procedure(s) Performed: AMPUTATION BELOW KNEE (Left: Knee)  Patient location during evaluation: PACU Anesthesia Type: General Level of consciousness: awake and alert Pain management: pain level controlled Vital Signs Assessment: post-procedure vital signs reviewed and stable Respiratory status: spontaneous breathing, nonlabored ventilation, respiratory function stable and patient connected to nasal cannula oxygen Cardiovascular status: blood pressure returned to baseline and stable Postop Assessment: no apparent nausea or vomiting Anesthetic complications: no   No notable events documented.   Last Vitals:  Vitals:   09/03/24 0716 09/03/24 1445  BP: (!) 150/59 (!) 142/61  Pulse: 63 66  Resp: 16 12  Temp: 37 C 36.4 C  SpO2: 97% 96%    Last Pain:  Vitals:   09/03/24 1458  TempSrc:   PainSc: 8                  Shau-Shau Tashaya Ancrum      "

## 2024-09-03 NOTE — Anesthesia Preprocedure Evaluation (Signed)
 "                                  Anesthesia Evaluation  Patient identified by MRN, date of birth, ID band Patient awake    Reviewed: Allergy & Precautions, NPO status , Patient's Chart, lab work & pertinent test results  History of Anesthesia Complications Negative for: history of anesthetic complications  Airway Mallampati: III  TM Distance: >3 FB Neck ROM: full    Dental  (+) Edentulous Lower, Edentulous Upper   Pulmonary asthma , Current Smoker   Pulmonary exam normal        Cardiovascular hypertension, + CAD and + Peripheral Vascular Disease  + dysrhythmias Atrial Fibrillation and Supra Ventricular Tachycardia      Neuro/Psych  PSYCHIATRIC DISORDERS Anxiety      Neuromuscular disease    GI/Hepatic Neg liver ROS,GERD  ,,  Endo/Other  diabetes, Type 2    Renal/GU negative Renal ROS  negative genitourinary   Musculoskeletal   Abdominal   Peds  Hematology negative hematology ROS (+)   Anesthesia Other Findings Past Medical History: No date: Adenomatous colon polyp No date: Allergic rhinitis No date: At risk for sleep apnea     Comment:  STOP-BANG= 5   SENT TO PCP 03-14-2014 No date: Bladder cancer (HCC) No date: CAD (coronary artery disease)     Comment:  a. 07/2014 low risk MV; b. 07/2019 Cor CTA (FFR): LM               25-49 (nl), LAD mild prox/mid plaque (nl), D1 25-49p(nl               w/ abnl FFR of 0.73 in inf branch), LCX/OM1 mild prox/mid              plaque (nl), RCA nondominant, minimal Ca2+ plaque (nl),               RPDA (mildly abnl @ 0.79)-->Med Rx.. No date: Cataract     Comment:  surgically removed bilateral No date: Condyloma acuminatum of penis No date: Diabetic neuropathy (HCC) No date: Diastolic dysfunction     Comment:  a. 05/2019 Echo: EF 60-65%, no rwam, mod LVH, impaired               relaxation, nl RV size/fxn, trace MR, triv TR. No date: Diverticulosis No date: GERD (gastroesophageal reflux disease) No date:  History of bladder cancer     Comment:  s/p  turbt  2013/   transitional cell carcinoma--  No date: History of condyloma acuminatum     Comment:  PERINEAL AREA  W/ RECURRENCY No date: History of gout No date: Hyperlipidemia No date: Hypertension 03/09/2024: Hypoxemia No date: Lower urinary tract symptoms (LUTS) No date: PAF (paroxysmal atrial fibrillation) (HCC)     Comment:  a. 06/2019 Event monitor: PAF <1% burden. Longest 3 mins              36 secs. No date: Productive cough No date: PSVT (paroxysmal supraventricular tachycardia)     Comment:  a. 06/2019 Event monitor: 112 episodes of SVT, longest               21 secs. No date: PVD (peripheral vascular disease) with claudication     Comment:  a. 03/2014 LE art duplex: long segment occlusion of mid  to distal R SFA; b. 03/2020 ABI: nl left and mildly               improved R ABI->med rx. No date: Renal artery stenosis No date: Renal artery stenosis     Comment:  a. 12/2020 Renal art duplex: RRA 1-59%, LRA >60%. No date: Smokers' cough (HCC) 1992: Type 2 diabetes mellitus with insulin  therapy (HCC)     Comment:  monitor by  dr ellsion No date: Wears dentures     Comment:  upper  Past Surgical History: 02/15/2024: ABDOMINAL AORTOGRAM W/LOWER EXTREMITY; N/A     Comment:  Procedure: ABDOMINAL AORTOGRAM W/LOWER EXTREMITY;                Surgeon: Pearline Norman RAMAN, MD;  Location: [facility] INVASIVE CV               LAB;  Service: Cardiovascular;  Laterality: N/A; 08/15/2024: ABDOMINAL AORTOGRAM W/LOWER EXTREMITY; N/A     Comment:  Procedure: ABDOMINAL AORTOGRAM W/LOWER EXTREMITY;                Surgeon: Marea Selinda RAMAN, MD;  Location: [facility] INVASIVE CV               LAB;  Service: Cardiovascular;  Laterality: N/A; 01/02/2024: AMPUTATION; Left     Comment:  Procedure: AMPUTATION, FOOT, RAY;  Surgeon: Malvin Marsa FALCON, DPM;  Location: [facility] OR;  Service:               Orthopedics/Podiatry;  Laterality: Left;  FIFTH  TOE 02/29/2024: AMPUTATION TOE; Left     Comment:  Procedure: AMPUTATION, LEFT GREAT TOE;  Surgeon:               Malvin Marsa FALCON, DPM;  Location: [facility] OR;  Service:              Orthopedics/Podiatry;  Laterality: Left;  Left Great toe               partial amputation,  Debridement Left Heel 03/29/2024: AMPUTATION TOE; Left     Comment:  Procedure: AMPUTATION LEFT GREAT TOE;  Surgeon:               Malvin Marsa FALCON, DPM;  Location: [facility] OR;  Service:              Orthopedics/Podiatry;  Laterality: Left;  LEFT GREAT TOE 08/17/2024: APPLICATION OF CELL SAVER; Left     Comment:  Procedure: APPLICATION OF CELL SAVER;  Surgeon: Marea Selinda RAMAN, MD;  Location: [facility] ORS;  Service: Vascular;                Laterality: Left;  SFA thrombectomy and stent 1997: AXILLARY HIDRADENITIS EXCISION 07-24-2014  dr darron grass: CARDIOVASCULAR STRESS TEST     Comment:  Low risk lexiscan  nuclear study with apical thinning and              small inferolateral wall infarct at mid & basal level ,               no ischemia/  normal LVF and wall motion , ef 59% No date: CATARACT EXTRACTION; Left No date: CATARACT EXTRACTION W/ INTRAOCULAR LENS IMPLANT; Right 03/20/2014: CO2 LASER APPLICATION; N/A     Comment:  Procedure: CO2 LASER APPLICATION,PENIS, GROIN, ANUS;  Surgeon: Elspeth KYM Schultze, MD;  Location: [facility];  Service: General;  Laterality: N/A; 05/21/2015: CO2 LASER APPLICATION; N/A     Comment:  Procedure: CO2 LASER APPLICATION;  Surgeon: Mark               Ottelin, MD;  Location: [facility] [place];                Service: Urology;  Laterality: N/A; 05/21/2015: CONDYLOMA EXCISION/FULGURATION; N/A     Comment:  Procedure: CONDYLOMA REMOVAL;  Surgeon: Mark Ottelin,               MD;  Location: [facility] ;  Service:               Urology;  Laterality: N/A; 08/16/2024: CORONARY PRESSURE/FFR STUDY; N/A     Comment:   Procedure: CORONARY PRESSURE/FFR STUDY;  Surgeon: Mady Bruckner, MD;  Location: [facility] INVASIVE CV LAB;                Service: Cardiovascular;  Laterality: N/A; 08/17/2024: ENDARTERECTOMY FEMORAL; Left     Comment:  Procedure: ENDARTERECTOMY, FEMORAL;  Surgeon: Marea Selinda RAMAN, MD;  Location: [facility] ORS;  Service: Vascular;                Laterality: Left; 10/24/2014: HEMORRHOID SURGERY     Comment:  Procedure: HEMORRHOIDECTOMY;  Surgeon: Elspeth Schultze, MD;              Location: [facility];  Service: General;; 10/24/2014: INCISION AND DRAINAGE ABSCESS; Left     Comment:  Procedure: INCISION AND DRAINAGE ABSCESS;  Surgeon:               Elspeth Schultze, MD;  Location: [facility];               Service: General;  Laterality: Left; 03/29/2024: INCISION AND DRAINAGE OF WOUND; Left     Comment:  Procedure: IRRIGATION AND DEBRIDEMENT PLANTAR FOOT               WOUND;  Surgeon: Malvin Marsa FALCON, DPM;  [facility];  Service: Orthopedics/Podiatry;  Laterality: Left;              LEFT GREAT TOE 1998, 1999: INGUINAL HIDRADENITIS EXCISION 08/17/2024: INSERTION OF ILIAC STENT; Left     Comment:  Procedure: INSERTION, STENT, ARTERY, ILIAC;  Surgeon:               Marea Selinda RAMAN, MD;  Location: [facility] ORS;  Service:               Vascular;  Laterality: Left;  SFA X2 03/20/2014: LASER ABLATION CONDOLAMATA; N/A     Comment:  Procedure: EXAM UNDER ANESTHESIA, REMOVAL/ABLATION OF               CONDYLOMATA PENIS,GROINS, ANUS, ANAL CANAL;  Surgeon:               Elspeth KYM Schultze, MD;  Location: [facility];  Service: General;  Laterality: N/A;  groin and  anus 10/24/2014: LASER ABLATION CONDOLAMATA; N/A     Comment:  Procedure: LASER ABLATION CONDOLAMATA;  Surgeon: Elspeth Schultze, MD;  Location: [facility];                Service: General;  Laterality: N/A; 07-29-2007   Dr. Schultze: LASER ABLATION OF PENILE AND PERIANAL WARTS 2003: LEFT SHOULDER SURGERY 01/01/2024: LOWER EXTREMITY ANGIOGRAPHY; N/A     Comment:  Procedure: Lower Extremity Angiography;  Surgeon: Sheree Penne Bruckner, MD;  Location: [facility] INVASIVE CV LAB;                Service: Vascular;  Laterality: N/A; 02/15/2024: LOWER EXTREMITY ANGIOGRAPHY; N/A     Comment:  Procedure: Lower Extremity Angiography;  Surgeon:               Pearline Norman RAMAN, MD;  Location: [facility] INVASIVE CV LAB;                Service: Cardiovascular;  Laterality: N/A; 01/01/2024: LOWER EXTREMITY INTERVENTION     Comment:  Procedure: LOWER EXTREMITY INTERVENTION;  Surgeon: Sheree Penne Bruckner, MD;  Location: [facility] INVASIVE CV LAB;                Service: Vascular;; 02/15/2024: LOWER EXTREMITY INTERVENTION; N/A     Comment:  Procedure: LOWER EXTREMITY INTERVENTION;  Surgeon:               Pearline Norman RAMAN, MD;  Location: [facility] INVASIVE CV LAB;                Service: Cardiovascular;  Laterality: N/A; 10/24/2014: MASS EXCISION; N/A     Comment:  Procedure: EXCISION OF PERINEAL MASS/SINUS;  Surgeon:               Elspeth Schultze, MD;  Location: [facility] [place];               Service: General;  Laterality: N/A; No date: MOHS SURGERY     Comment:  back 2017: MOHS SURGERY     Comment:  face No date: MULTIPLE TOOTH EXTRACTIONS 1998, 1999: PERINEAL HIDRADENITIS EXCISION 08/16/2024: RIGHT/LEFT HEART CATH AND CORONARY ANGIOGRAPHY; N/A     Comment:  Procedure: RIGHT/LEFT HEART CATH AND CORONARY               ANGIOGRAPHY;  Surgeon: Mady Bruckner, MD;  Location:               [facility] INVASIVE CV LAB;  Service: Cardiovascular;                Laterality: N/A; 08/17/2024: THROMBECTOMY FEMORAL ARTERY; Left     Comment:  Procedure: THROMBECTOMY, ARTERY, FEMORAL;  Surgeon: Marea Selinda RAMAN, MD;  Location: [facility] ORS;  Service: Vascular;                Laterality: Left; 05/21/2012: TRANSURETHRAL  RESECTION OF BLADDER TUMOR     Comment:  Procedure: TRANSURETHRAL RESECTION OF BLADDER TUMOR               (TURBT);  Surgeon: Mark C Ottelin, MD;  Location: [place];  Service: Urology;  Laterality: N/A;                BMI    Body Mass Index: 21.40 kg/m      Reproductive/Obstetrics negative OB ROS                              Anesthesia Physical Anesthesia Plan  ASA: 3  Anesthesia Plan: General   Post-op Pain Management:    Induction: Intravenous  PONV Risk Score and Plan:   Airway Management Planned: LMA  Additional Equipment:   Intra-op Plan:   Post-operative Plan: Extubation in OR  Informed Consent: I have reviewed the patients History and Physical, chart, labs and discussed the procedure including the risks, benefits and alternatives for the proposed anesthesia with the patient or authorized representative who has indicated his/her understanding and acceptance.     Dental Advisory Given  Plan Discussed with: CRNA  Anesthesia Plan Comments: (Has had LMA in past with no AC. )         Anesthesia Quick Evaluation  "

## 2024-09-04 ENCOUNTER — Encounter: Payer: Self-pay | Admitting: Vascular Surgery

## 2024-09-04 DIAGNOSIS — R197 Diarrhea, unspecified: Secondary | ICD-10-CM | POA: Diagnosis not present

## 2024-09-04 DIAGNOSIS — R627 Adult failure to thrive: Secondary | ICD-10-CM | POA: Diagnosis not present

## 2024-09-04 DIAGNOSIS — L03116 Cellulitis of left lower limb: Secondary | ICD-10-CM | POA: Diagnosis not present

## 2024-09-04 DIAGNOSIS — I739 Peripheral vascular disease, unspecified: Secondary | ICD-10-CM | POA: Diagnosis not present

## 2024-09-04 LAB — GLUCOSE, CAPILLARY
Glucose-Capillary: 402 mg/dL — ABNORMAL HIGH (ref 70–99)
Glucose-Capillary: 216 mg/dL — ABNORMAL HIGH (ref 70–99)
Glucose-Capillary: 227 mg/dL — ABNORMAL HIGH (ref 70–99)
Glucose-Capillary: 254 mg/dL — ABNORMAL HIGH (ref 70–99)
Glucose-Capillary: 237 mg/dL — ABNORMAL HIGH (ref 70–99)

## 2024-09-04 LAB — HEPARIN LEVEL (UNFRACTIONATED)
Heparin Unfractionated: 0.26 [IU]/mL — ABNORMAL LOW (ref 0.30–0.70)
Heparin Unfractionated: 0.29 [IU]/mL — ABNORMAL LOW (ref 0.30–0.70)

## 2024-09-04 MED ORDER — ADULT MULTIVITAMIN W/MINERALS CH
1.0000 | ORAL_TABLET | Freq: Every day | ORAL | Status: DC
  Administered 2024-09-04 – 2024-09-06 (×3): 1 via ORAL
  Filled 2024-09-04 (×3): qty 1

## 2024-09-04 MED ORDER — HEPARIN BOLUS VIA INFUSION
1200.0000 [IU] | Freq: Once | INTRAVENOUS | Status: DC
  Filled 2024-09-04: qty 1200

## 2024-09-04 MED ORDER — INSULIN ASPART 100 UNIT/ML IJ SOLN
15.0000 [IU] | Freq: Once | INTRAMUSCULAR | Status: AC
  Administered 2024-09-04: 15 [IU] via SUBCUTANEOUS
  Filled 2024-09-04: qty 15

## 2024-09-04 MED ORDER — VITAMIN C 500 MG PO TABS
500.0000 mg | ORAL_TABLET | Freq: Two times a day (BID) | ORAL | Status: DC
  Administered 2024-09-04 – 2024-09-06 (×5): 500 mg via ORAL
  Filled 2024-09-04 (×5): qty 1

## 2024-09-04 MED ORDER — INSULIN GLARGINE 100 UNIT/ML ~~LOC~~ SOLN
20.0000 [IU] | Freq: Two times a day (BID) | SUBCUTANEOUS | Status: DC
  Administered 2024-09-04 – 2024-09-05 (×3): 20 [IU] via SUBCUTANEOUS
  Filled 2024-09-04 (×3): qty 0.2

## 2024-09-04 MED ORDER — APIXABAN 5 MG PO TABS
5.0000 mg | ORAL_TABLET | Freq: Two times a day (BID) | ORAL | Status: DC
  Administered 2024-09-04 – 2024-09-06 (×5): 5 mg via ORAL
  Filled 2024-09-04 (×5): qty 1

## 2024-09-04 NOTE — Progress Notes (Signed)
 [facility]  - [place] [facility]   PATIENT NAME: [name]    MR#:  [id number]  DATE OF BIRTH:  Jul 19, 1956  SUBJECTIVE:  no family at bedside. S/p BKA. Sugars 400. Resumed insulin  IV heparin  gtt  VITALS:  Blood pressure (!) 145/66, pulse 76, temperature 98.2 F (36.8 C), temperature source Oral, resp. rate 20, height 5' 8 (1.727 m), weight 78.1 kg, SpO2 96%.  PHYSICAL EXAMINATION:   GENERAL:  69 y.o.-year-old patient with no acute distress. disheveled LUNGS: Normal breath sounds bilaterally, no wheezing CARDIOVASCULAR: S1, S2 normal. No murmur   ABDOMEN: Soft, nontender, nondistended. EXTREMITIES:    NEUROLOGIC: nonfocal  patient is alert and awake SKIN: as above  LABORATORY PANEL:  CBC Recent Labs  Lab 09/03/24 0150  WBC 13.8*  HGB 9.6*  HCT 28.2*  PLT 275    Chemistries  Recent Labs  Lab 09/02/24 0557  NA 138  K 4.1  CL 103  CO2 27  GLUCOSE 184*  BUN 15  CREATININE 0.72  CALCIUM  8.6*     Assessment and Plan  From H&P DA AUTHEMENT is a 69 y.o. 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   Patient was recently hospitalized 2 weeks ago for left foot infection including cellulitis, he was treated with IV antibiotics and discharged home on 7 days course of Augmentin  and Zyvox .  Patient told me that however he was not able to get his prescription filled due to transportation problems, this Monday, he was able to went to [facility] to pick up his antibiotics.  But at that time he already noticed worsening of left foot infection and decided to come to the hospital instead of taking p.o. antibiotics at home.  MRI  left foot :1. Postoperative changes related to first through fifth ray  transmetatarsal amputation to the level of the first through fifth  metatarsal bases. Nonspecific increased edema like signal at the  amputation margins of the third, fourth, and fifth metatarsal base  remnants with enhancement. These findings are favored to reflect  sequela of recent postsurgical change, however, osteomyelitis can  not be entirely excluded.  2. Overlying amputation stump with subcutaneous soft tissue edema  and heterogenous fluid signal measuring up to 2.3 x 0.5 cm  demonstrating thin peripheral enhancement extending to the  amputation margins of the first through fifth metatarsal base  remnants, compatible with a postoperative collection of  indeterminate sterility.   Worsening of left foot TMA site/gangrene - Secondary to nonadherence to antibiotics/dressing change and unable to care for self - Review of most recent wound culture showed multiple organisms, plan to continue antibiotic regimen on discharge from last week, Zyvox  + Meropenem  for now.  - Podiatry consulted in ED--recommends vascular consult--Dr Dew aware--will d/w regarding BKA - Blood culture negative so far --Plans for BKA today per Vascular --1/25--s/p BKA--d/w dr Cala to switch to eliquis  and will stop abxs   PAD --Status post recent-left common femoral artery, profunda femoris, SFA endarterectomy and patch he angioplasty, mechanical thrombectomy to left SFA and proximal popliteal artery - Local left groin surgical site clean no signs of infection - Continue aspirin  and statin   History of HFrEF Nonobstructive CAD status post recent LHC  Ischemic cardiomyopathy - Euvolemic - Continue p.o. Lasix  - Continue ARB and spironolactone    PAF - In sinus rhythm, continue amiodarone  - Hold off Eliquis  in case patient will need surgical intervention.--Now on IV heparin  gtt--now resumed po eliquis    IDDM with severe PAD - SSI and  lantus   Diarrhea since 1/22 recent antibiotic use -- patient is having type VII diarrheal stool -- elevated white count of 12 -- not on any stool softener or MiraLAX -- infectious disease curb sided regarding sending stool for C. diff. Dr. Searcy recommends hold off for now --1/25--no diarrhea    Procedures: left BKA Family communication :none Consults : vascular, podiatry CODE STATUS: full DVT Prophylaxis : heparin  drip Level of care: Med-Surg Status is: Inpatient Remains inpatient appropriate because: infection of the amputation stump. Pending vascular consult    TOTAL TIME TAKING CARE OF THIS PATIENT: 40 minutes.  >50% time spent on counselling and coordination of care  Note: This dictation was prepared with Dragon dictation along with smaller phrase technology. Any transcriptional errors that result from this process are unintentional.  Leita Blanch M.D    [facility]   CC: Primary care physician; Norleen Lynwood ORN, MD

## 2024-09-04 NOTE — Progress Notes (Signed)
 PHARMACY - ANTICOAGULATION CONSULT NOTE  Pharmacy Consult for Heparin  Infusion Indication: atrial fibrillation  Allergies[1]  Patient Measurements: Height: 5' 8 (172.7 cm) Weight: 78.1 kg (172 lb 2.9 oz) IBW/kg (Calculated) : 68.4 HEPARIN  DW (KG): 78.1  Labs: Recent Labs    09/01/24 0930 09/02/24 0557 09/02/24 1008 09/02/24 1819 09/03/24 0150 09/03/24 0901 09/03/24 2324 09/04/24 0759  HGB  --  10.1*  --   --  9.6*  --   --   --   HCT  --  29.9*  --   --  28.2*  --   --   --   PLT  --  302  --   --  275  --   --   --   APTT  --   --  37*  --   --   --   --   --   LABPROT  --   --  14.5  --   --   --   --   --   INR  --   --  1.1  --   --   --   --   --   HEPARINUNFRC  --   --  0.25*   < > 0.11* 0.11* 0.26* 0.29*  CREATININE 0.87 0.72  --   --   --   --   --   --    < > = values in this interval not displayed.    Estimated Creatinine Clearance: 86.7 mL/min (by C-G formula based on SCr of 0.72 mg/dL).   Medical History: Past Medical History:  Diagnosis Date   Adenomatous colon polyp    Allergic rhinitis    At risk for sleep apnea    STOP-BANG= 5   SENT TO PCP 03-14-2014   Bladder cancer (HCC)    CAD (coronary artery disease)    a. 07/2014 low risk MV; b. 07/2019 Cor CTA (FFR): LM 25-49 (nl), LAD mild prox/mid plaque (nl), D1 25-49p(nl w/ abnl FFR of 0.73 in inf branch), LCX/OM1 mild prox/mid plaque (nl), RCA nondominant, minimal Ca2+ plaque (nl), RPDA (mildly abnl @ 0.79)-->Med Rx..   Cataract    surgically removed bilateral   Condyloma acuminatum of penis    Diabetic neuropathy (HCC)    Diastolic dysfunction    a. 05/2019 Echo: EF 60-65%, no rwam, mod LVH, impaired relaxation, nl RV size/fxn, trace MR, triv TR.   Diverticulosis    GERD (gastroesophageal reflux disease)    History of bladder cancer    s/p  turbt  2013/   transitional cell carcinoma--    History of condyloma acuminatum    PERINEAL AREA  W/ RECURRENCY   History of gout    Hyperlipidemia     Hypertension    Hypoxemia 03/09/2024   Lower urinary tract symptoms (LUTS)    PAF (paroxysmal atrial fibrillation) (HCC)    a. 06/2019 Event monitor: PAF <1% burden. Longest 3 mins 36 secs.   Productive cough    PSVT (paroxysmal supraventricular tachycardia)    a. 06/2019 Event monitor: 112 episodes of SVT, longest 21 secs.   PVD (peripheral vascular disease) with claudication    a. 03/2014 LE art duplex: long segment occlusion of mid to distal R SFA; b. 03/2020 ABI: nl left and mildly improved R ABI->med rx.   Renal artery stenosis    Renal artery stenosis    a. 12/2020 Renal art duplex: RRA 1-59%, LRA >60%.   Smokers' cough (HCC)    Type  2 diabetes mellitus with insulin  therapy (HCC) 1992   monitor by  dr ellsion   Wears dentures    upper    Medications:  Apixaban  5 mg PO twice daily - last dose was 1/20 Enoxaparin  40 mg SQ given 1/22 @ 2239  Assessment: Patient is a 69 year old male with a past medical history of PAD with recent thrombectomy to L SFA and proximal popliteal artery, left foot osteomyelitis status post transmetatarsal amputation on August 18, 2024, IDDM, HTN, nonobstructive CAD, ischemic cardiomyopathy, chronic HFrEF with LVEF 30-35%, PAF on Eliquis , and bladder cancer. He presented to ED with worsening left foot infection. His apixaban  for PAF was held upon admission for potential procedure with podiatry/vascular surgery and he was started on enoxaparin  for DVT prophylaxis. Given  CHA2DS2VASc Score of 5 (Age 9-74, HF, HTN, Vascular disease, DM), recommended the initiation of heparin  infusion until procedure plan decided. Team agreed and pharmacy was consulted to initiate patient on a heparin  infusion.  Baseline Labs: INR 1.1 aPTT 37 HL 0.25 Hgb 10.1 PLT 302  No signs/symptoms of bleeding noted in chart.  Goal of Therapy:  Heparin  level 0.3-0.7 units/ml Monitor platelets by anticoagulation protocol: Yes  1/23 1819: HL 0.17, Subtherapeutic@1000  units/hr 1/24  0150 HL 0.11, subthera; 1250 un/hr 1/24 0901 HL 0.11, subthera; 1500 un/hr 1/24 2334 HL 0.26, subthera; 1800 un/hr 1/25 0759 HL 0.29, subthera; 1950 un/hr   Plan:  --No boluses, per vascular --Increase heparin  infusion rate to 2100 units/hr --Re-check HL 6 hours from rate change --Daily CBC per protocol while on IV heparin   [name] 09/04/2024 8:43 AM    [1]  Allergies Allergen Reactions   Cefepime  Hives    Had allergic reaction to one of these 3 agents, unclear which one   Metoprolol  Hives    Had allergic reaction to one of these 3 agents, unclear which one  *Per RN, highly likely this is the cause of allergic rxn   Myrbetriq  [Mirabegron ] Hives    Had allergic reaction to one of these 3 agents, unclear which one

## 2024-09-04 NOTE — Progress Notes (Signed)
 [facility]  - [place] [facility]   PATIENT NAME: [name]    MR#:  [id number]  DATE OF BIRTH:  07/01/1956  SUBJECTIVE:  no family at bedside. NPO for BKA no diarrhea  VITALS:  Blood pressure (!) 145/66, pulse 76, temperature 98.2 F (36.8 C), temperature source Oral, resp. rate 20, height 5' 8 (1.727 m), weight 78.1 kg, SpO2 96%.  PHYSICAL EXAMINATION:   GENERAL:  69 y.o.-year-old patient with no acute distress. disheveled LUNGS: Normal breath sounds bilaterally, no wheezing CARDIOVASCULAR: S1, S2 normal. No murmur   ABDOMEN: Soft, nontender, nondistended. Bowel sounds present.  EXTREMITIES:    NEUROLOGIC: nonfocal  patient is alert and awake SKIN: as above  LABORATORY PANEL:  CBC Recent Labs  Lab 09/03/24 0150  WBC 13.8*  HGB 9.6*  HCT 28.2*  PLT 275    Chemistries  Recent Labs  Lab 09/02/24 0557  NA 138  K 4.1  CL 103  CO2 27  GLUCOSE 184*  BUN 15  CREATININE 0.72  CALCIUM  8.6*   Cardiac Enzymes No results for input(s): TROPONINI in the last 168 hours. RADIOLOGY:  No results found.   Assessment and Plan  From H&P   [name] is a 69 y.o. 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   Patient was recently hospitalized 2 weeks ago for left foot infection including cellulitis, he was treated with IV antibiotics and discharged home on 7 days course of Augmentin  and Zyvox .  Patient told me that however he was not able to get his prescription filled due to transportation problems, this Monday, he was able to went to [facility] to pick up his antibiotics.  But at that time he already noticed worsening of  left foot infection and decided to come to the hospital instead of taking p.o. antibiotics at home.  MRI left foot :1. Postoperative changes related to first through fifth ray  transmetatarsal amputation to the level of the first through fifth  metatarsal bases. Nonspecific increased edema like signal at the  amputation margins of the third, fourth, and fifth metatarsal base  remnants with enhancement. These findings are favored to reflect  sequela of recent postsurgical change, however, osteomyelitis can  not be entirely excluded.  2. Overlying amputation stump with subcutaneous soft tissue edema  and heterogenous fluid signal measuring up to 2.3 x 0.5 cm  demonstrating thin peripheral enhancement extending to the  amputation margins of the first through fifth metatarsal base  remnants, compatible with a postoperative collection of  indeterminate sterility.   Worsening of left foot TMA site cellulitis/abscess - Secondary to nonadherence to antibiotics/dressing change and unable to care for self - Review of most recent wound culture showed multiple organisms, plan to continue antibiotic regimen on discharge from last week, Zyvox  + Meropenem  for now.  - Podiatry consulted in ED--recommends vascular consult--[name] aware--will d/w regarding BKA - Blood culture negative so far --Plans for BKA today per Vascular   PAD --Status post recent-left common femoral artery, profunda femoris, SFA endarterectomy and patch he angioplasty, mechanical thrombectomy to left SFA and proximal popliteal artery - Local left groin surgical site clean no signs of infection - Continue aspirin  and statin   History  of HFrEF Nonobstructive CAD status post recent LHC Ischemic cardiomyopathy - Euvolemic - Continue p.o. Lasix  - Continue ARB and spironolactone    PAF - In sinus rhythm, continue amiodarone  - Hold off Eliquis  in case patient will need surgical intervention.--Now on IV heparin  gtt   IDDM with  severe PAD - Glucose 164 this morning - Cut down Lantus  to 15 units daily - SSI  Diarrhea since 1/22 recent antibiotic use -- patient is having type VII diarrheal stool -- elevated white count of 12 -- not on any stool softener or MiraLAX -- infectious disease curb sided regarding sending stool for C. diff. [name] recommends hold off for now    Procedures: Family communication :none Consults : vascular, podiatry CODE STATUS: full DVT Prophylaxis : heparin  drip Level of care: Med-Surg Status is: Inpatient Remains inpatient appropriate because: infection of the amputation stump. Pending vascular consult    TOTAL TIME TAKING CARE OF THIS PATIENT: 40 minutes.  >50% time spent on counselling and coordination of care  Note: This dictation was prepared with Dragon dictation along with smaller phrase technology. Any transcriptional errors that result from this process are unintentional.  [name] M.D    [facility]   CC: Primary care physician; [name] ORN, MD

## 2024-09-04 NOTE — Progress Notes (Addendum)
 PHARMACY - ANTICOAGULATION CONSULT NOTE  Pharmacy Consult for Heparin  Infusion Indication: atrial fibrillation  Allergies[1]  Patient Measurements: Height: 5' 8 (172.7 cm) Weight: 78.1 kg (172 lb 2.9 oz) IBW/kg (Calculated) : 68.4 HEPARIN  DW (KG): 78.1  Labs: Recent Labs    09/01/24 0838 09/01/24 0930 09/02/24 0557 09/02/24 1008 09/02/24 1819 09/03/24 0150 09/03/24 0901 09/03/24 2324  HGB 10.4*  --  10.1*  --   --  9.6*  --   --   HCT 30.9*  --  29.9*  --   --  28.2*  --   --   PLT 355  --  302  --   --  275  --   --   APTT  --   --   --  37*  --   --   --   --   LABPROT  --   --   --  14.5  --   --   --   --   INR  --   --   --  1.1  --   --   --   --   HEPARINUNFRC  --   --   --  0.25*   < > 0.11* 0.11* 0.26*  CREATININE  --  0.87 0.72  --   --   --   --   --    < > = values in this interval not displayed.    Estimated Creatinine Clearance: 86.7 mL/min (by C-G formula based on SCr of 0.72 mg/dL).   Medical History: Past Medical History:  Diagnosis Date   Adenomatous colon polyp    Allergic rhinitis    At risk for sleep apnea    STOP-BANG= 5   SENT TO PCP 03-14-2014   Bladder cancer (HCC)    CAD (coronary artery disease)    a. 07/2014 low risk MV; b. 07/2019 Cor CTA (FFR): LM 25-49 (nl), LAD mild prox/mid plaque (nl), D1 25-49p(nl w/ abnl FFR of 0.73 in inf branch), LCX/OM1 mild prox/mid plaque (nl), RCA nondominant, minimal Ca2+ plaque (nl), RPDA (mildly abnl @ 0.79)-->Med Rx..   Cataract    surgically removed bilateral   Condyloma acuminatum of penis    Diabetic neuropathy (HCC)    Diastolic dysfunction    a. 05/2019 Echo: EF 60-65%, no rwam, mod LVH, impaired relaxation, nl RV size/fxn, trace MR, triv TR.   Diverticulosis    GERD (gastroesophageal reflux disease)    History of bladder cancer    s/p  turbt  2013/   transitional cell carcinoma--    History of condyloma acuminatum    PERINEAL AREA  W/ RECURRENCY   History of gout    Hyperlipidemia     Hypertension    Hypoxemia 03/09/2024   Lower urinary tract symptoms (LUTS)    PAF (paroxysmal atrial fibrillation) (HCC)    a. 06/2019 Event monitor: PAF <1% burden. Longest 3 mins 36 secs.   Productive cough    PSVT (paroxysmal supraventricular tachycardia)    a. 06/2019 Event monitor: 112 episodes of SVT, longest 21 secs.   PVD (peripheral vascular disease) with claudication    a. 03/2014 LE art duplex: long segment occlusion of mid to distal R SFA; b. 03/2020 ABI: nl left and mildly improved R ABI->med rx.   Renal artery stenosis    Renal artery stenosis    a. 12/2020 Renal art duplex: RRA 1-59%, LRA >60%.   Smokers' cough (HCC)    Type 2 diabetes mellitus with  insulin  therapy (HCC) 1992   monitor by  dr ellsion   Wears dentures    upper    Medications:  Apixaban  5 mg PO twice daily - last dose was 1/20 Enoxaparin  40 mg SQ given 1/22 @ 2239  Assessment: Patient is a 69 year old male with a past medical history of PAD with recent thrombectomy to L SFA and proximal popliteal artery, left foot osteomyelitis status post transmetatarsal amputation on August 18, 2024, IDDM, HTN, nonobstructive CAD, ischemic cardiomyopathy, chronic HFrEF with LVEF 30-35%, PAF on Eliquis , and bladder cancer. He presented to ED with worsening left foot infection. His apixaban  for PAF was held upon admission for potential procedure with podiatry/vascular surgery and he was started on enoxaparin  for DVT prophylaxis. Given  CHA2DS2VASc Score of 5 (Age 42-74, HF, HTN, Vascular disease, DM), recommended the initiation of heparin  infusion until procedure plan decided. Team agreed and pharmacy was consulted to initiate patient on a heparin  infusion.  Baseline Labs: INR 1.1 aPTT 37 HL 0.25 Hgb 10.1 PLT 302  No signs/symptoms of bleeding noted in chart.  Goal of Therapy:  Heparin  level 0.3-0.7 units/ml Monitor platelets by anticoagulation protocol: Yes  1/23 1819: HL 0.17, Subtherapeutic@1000  units/hr 1/24  0150 HL 0.11, subthera; 1250 un/hr 1/24 0901 HL 0.11, subthera; 1500 un/hr 1/24 2334 HL 0.26, subtherapeutic   Plan:  --No boluses, per vascular -- increase heparin  infusion rate to 1950 units/hr --Re-check HL 6 hours from rate change --Daily CBC per protocol while on IV heparin   Rankin CANDIE Dills, PharmD, [facility] 09/04/2024 12:53 AM      [1]  Allergies Allergen Reactions   Cefepime  Hives    Had allergic reaction to one of these 3 agents, unclear which one   Metoprolol  Hives    Had allergic reaction to one of these 3 agents, unclear which one  *Per RN, highly likely this is the cause of allergic rxn   Myrbetriq  [Mirabegron ] Hives    Had allergic reaction to one of these 3 agents, unclear which one

## 2024-09-04 NOTE — Plan of Care (Signed)
" °  Problem: Education: Goal: Ability to describe self-care measures that may prevent or decrease complications (Diabetes Survival Skills Education) will improve Outcome: Progressing   Problem: Coping: Goal: Ability to adjust to condition or change in health will improve Outcome: Progressing   Problem: Fluid Volume: Goal: Ability to maintain a balanced intake and output will improve Outcome: Progressing   Problem: Health Behavior/Discharge Planning: Goal: Ability to identify and utilize available resources and services will improve Outcome: Progressing Goal: Ability to manage health-related needs will improve Outcome: Progressing   Problem: Metabolic: Goal: Ability to maintain appropriate glucose levels will improve Outcome: Progressing   Problem: Nutritional: Goal: Maintenance of adequate nutrition will improve Outcome: Progressing Goal: Progress toward achieving an optimal weight will improve Outcome: Progressing   Problem: Skin Integrity: Goal: Risk for impaired skin integrity will decrease Outcome: Progressing   Problem: Tissue Perfusion: Goal: Adequacy of tissue perfusion will improve Outcome: Progressing   Problem: Education: Goal: Knowledge of General Education information will improve Description: Including pain rating scale, medication(s)/side effects and non-pharmacologic comfort measures Outcome: Progressing   Problem: Health Behavior/Discharge Planning: Goal: Ability to manage health-related needs will improve Outcome: Progressing   Problem: Clinical Measurements: Goal: Ability to maintain clinical measurements within normal limits will improve Outcome: Progressing Goal: Will remain free from infection Outcome: Progressing Goal: Diagnostic test results will improve Outcome: Progressing Goal: Respiratory complications will improve Outcome: Progressing Goal: Cardiovascular complication will be avoided Outcome: Progressing   Problem: Activity: Goal:  Risk for activity intolerance will decrease Outcome: Progressing   Problem: Nutrition: Goal: Adequate nutrition will be maintained Outcome: Progressing   Problem: Coping: Goal: Level of anxiety will decrease Outcome: Progressing   Problem: Elimination: Goal: Will not experience complications related to bowel motility Outcome: Progressing Goal: Will not experience complications related to urinary retention Outcome: Progressing   Problem: Pain Managment: Goal: General experience of comfort will improve and/or be controlled Outcome: Progressing   Problem: Safety: Goal: Ability to remain free from injury will improve Outcome: Progressing   Problem: Skin Integrity: Goal: Risk for impaired skin integrity will decrease Outcome: Progressing   Problem: Education: Goal: Knowledge of the prescribed therapeutic regimen will improve Outcome: Progressing   Problem: Bowel/Gastric: Goal: Gastrointestinal status for postoperative course will improve Outcome: Progressing   Problem: Cardiac: Goal: Ability to maintain an adequate cardiac output Outcome: Progressing Goal: Will show no evidence of cardiac arrhythmias Outcome: Progressing   Problem: Nutritional: Goal: Will attain and maintain optimal nutritional status Outcome: Progressing   Problem: Neurological: Goal: Will regain or maintain usual level of consciousness Outcome: Progressing   Problem: Clinical Measurements: Goal: Ability to maintain clinical measurements within normal limits Outcome: Progressing Goal: Postoperative complications will be avoided or minimized Outcome: Progressing   Problem: Respiratory: Goal: Will regain and/or maintain adequate ventilation Outcome: Progressing Goal: Respiratory status will improve Outcome: Progressing   Problem: Skin Integrity: Goal: Demonstrates signs of wound healing without infection Outcome: Progressing   Problem: Urinary Elimination: Goal: Will remain free from  infection Outcome: Progressing Goal: Ability to achieve and maintain adequate urine output Outcome: Progressing   "
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
## 2024-09-04 NOTE — Progress Notes (Signed)
 [facility]  Daily Progress Note   Subjective  -   Says the left ankle hurts most of all but less than pre-op.  End of stump is also sore but controlled with pain meds.  Did not like breakfast or lunch choices but no nausea.  Importance of nutrition to healing was reviewed.  Objective Vitals:   09/03/24 1620 09/03/24 2109 09/04/24 0517 09/04/24 0736  BP: (!) 141/61 139/62 136/64 (!) 145/66  Pulse: 74 76 74 76  Resp: 15 12 12 20   Temp: 97.8 F (36.6 C) 97.8 F (36.6 C) 98.5 F (36.9 C) 98.2 F (36.8 C)  TempSrc:  Oral Oral Oral  SpO2: 96% 98% 95% 96%  Weight:      Height:        Intake/Output Summary (Last 24 hours) at 09/04/2024 1206 Last data filed at 09/04/2024 0659 Gross per 24 hour  Intake 1697.05 ml  Output 1055 ml  Net 642.05 ml    PULM  CTAB CV  RRR VASC  Knee immobilizer in place and no stain on dressing.  Right leg and foot are fine.  Laboratory CBC    Component Value Date/Time   WBC 13.8 (H) 09/03/2024 0150   HGB 9.6 (L) 09/03/2024 0150   HCT 28.2 (L) 09/03/2024 0150   PLT 275 09/03/2024 0150    BMET    Component Value Date/Time   NA 138 09/02/2024 0557   NA 138 06/16/2019 1144   K 4.1 09/02/2024 0557   CL 103 09/02/2024 0557   CO2 27 09/02/2024 0557   GLUCOSE 184 (H) 09/02/2024 0557   BUN 15 09/02/2024 0557   BUN 15 06/16/2019 1144   CREATININE 0.72 09/02/2024 0557   CALCIUM  8.6 (L) 09/02/2024 0557   GFRNONAA >60 09/02/2024 0557   GFRAA >60 02/29/2020 9062    Assessment/Planning: POD #1 s/p L BKA  No new issues.  Doing well post-op.   [name]  09/04/2024, 12:06 PM

## 2024-09-04 NOTE — Care Management Important Message (Signed)
 Important Message  Patient Details  Name: [name] MRN: [id number] Date of Birth: 1955/10/12   Important Message Given:  Yes - Medicare IM     Christna Kulick W, CMA 09/04/2024, 12:15 PM

## 2024-09-05 DIAGNOSIS — W19XXXA Unspecified fall, initial encounter: Secondary | ICD-10-CM | POA: Diagnosis not present

## 2024-09-05 DIAGNOSIS — R627 Adult failure to thrive: Secondary | ICD-10-CM | POA: Diagnosis not present

## 2024-09-05 DIAGNOSIS — I96 Gangrene, not elsewhere classified: Secondary | ICD-10-CM | POA: Diagnosis not present

## 2024-09-05 DIAGNOSIS — I739 Peripheral vascular disease, unspecified: Secondary | ICD-10-CM | POA: Diagnosis not present

## 2024-09-05 DIAGNOSIS — L03116 Cellulitis of left lower limb: Secondary | ICD-10-CM | POA: Diagnosis not present

## 2024-09-05 DIAGNOSIS — E1151 Type 2 diabetes mellitus with diabetic peripheral angiopathy without gangrene: Secondary | ICD-10-CM | POA: Diagnosis not present

## 2024-09-05 DIAGNOSIS — Z794 Long term (current) use of insulin: Secondary | ICD-10-CM | POA: Diagnosis not present

## 2024-09-05 DIAGNOSIS — R197 Diarrhea, unspecified: Secondary | ICD-10-CM | POA: Diagnosis not present

## 2024-09-05 LAB — CBC
HCT: 24.4 % — ABNORMAL LOW (ref 39.0–52.0)
Hemoglobin: 8.4 g/dL — ABNORMAL LOW (ref 13.0–17.0)
MCH: 31.9 pg (ref 26.0–34.0)
MCHC: 34.4 g/dL (ref 30.0–36.0)
MCV: 92.8 fL (ref 80.0–100.0)
Platelets: 288 10*3/uL (ref 150–400)
RBC: 2.63 MIL/uL — ABNORMAL LOW (ref 4.22–5.81)
RDW: 14.4 % (ref 11.5–15.5)
WBC: 17.2 10*3/uL — ABNORMAL HIGH (ref 4.0–10.5)
nRBC: 0 % (ref 0.0–0.2)

## 2024-09-05 LAB — GLUCOSE, CAPILLARY
Glucose-Capillary: 126 mg/dL — ABNORMAL HIGH (ref 70–99)
Glucose-Capillary: 119 mg/dL — ABNORMAL HIGH (ref 70–99)
Glucose-Capillary: 126 mg/dL — ABNORMAL HIGH (ref 70–99)
Glucose-Capillary: 176 mg/dL — ABNORMAL HIGH (ref 70–99)

## 2024-09-05 MED ORDER — INSULIN GLARGINE 100 UNIT/ML ~~LOC~~ SOLN
24.0000 [IU] | Freq: Two times a day (BID) | SUBCUTANEOUS | Status: DC
  Administered 2024-09-05 – 2024-09-06 (×2): 24 [IU] via SUBCUTANEOUS
  Filled 2024-09-05 (×3): qty 0.24

## 2024-09-05 NOTE — Plan of Care (Signed)

## 2024-09-05 NOTE — TOC Progression Note (Addendum)
 Transition of Care [facility]) - Progression Note    Patient Details  Name: [name] MRN: [id number] Date of Birth: 1955/09/29  Transition of Care [facility]) CM/SW Contact  Grayce JAYSON Perfect, RN Phone Number: 09/05/2024, 10:54 AM  Clinical Narrative:   RNCM spoke to patient by phone.  He is agreeable to go to [facility] in [place] tomorrow.  Obtained authorization from insurance : [id number] .  Ref # [id number]   1/23-1/27.  Tammy at [facility] notified of this plan                     Expected Discharge Plan and Services                                               Social Drivers of Health (SDOH) Interventions SDOH Screenings   Food Insecurity: Food Insecurity Present (09/01/2024)  Housing: High Risk (09/02/2024)  Transportation Needs: Unmet Transportation Needs (09/01/2024)  Utilities: At Risk (09/01/2024)  Alcohol Screen: Low Risk (06/10/2023)  Depression (PHQ2-9): Low Risk (07/05/2024)  Financial Resource Strain: High Risk (03/11/2024)  Physical Activity: Inactive (06/10/2023)  Social Connections: Socially Isolated (09/01/2024)  Stress: Stress Concern Present (03/11/2024)  Tobacco Use: High Risk (08/30/2024)  Health Literacy: Inadequate Health Literacy (02/17/2024)    Readmission Risk Interventions    03/30/2024    2:55 PM 03/09/2024    3:21 PM 12/30/2023    1:17 PM  Readmission Risk Prevention Plan  Post Dischage Appt   Complete  Medication Screening   Complete  Transportation Screening Complete Complete Complete  PCP or Specialist Appt within 5-7 Days  Complete   PCP or Specialist Appt within 3-5 Days Complete    Home Care Screening  Complete   Medication Review (RN CM)  Complete   HRI or Home Care Consult Complete    Social Work Consult for Recovery Care Planning/Counseling Complete    Palliative Care Screening Complete    Medication Review Oceanographer) Referral to Pharmacy

## 2024-09-05 NOTE — Progress Notes (Signed)
 [facility]  - [place] [facility]   PATIENT NAME: [name]    MR#:  [id number]  DATE OF BIRTH:  Jul 28, 1956  SUBJECTIVE:  no family at bedside. S/p BKA. Adjusting insulin . Dicussed d/c plans Seen by Dr Marea today  VITALS:  Blood pressure (!) 125/55, pulse 72, temperature 98.3 F (36.8 C), temperature source Oral, resp. rate 20, height 5' 8 (1.727 m), weight 78.1 kg, SpO2 97%.  PHYSICAL EXAMINATION:   GENERAL:  69 y.o.-year-old patient with no acute distress. disheveled LUNGS: Normal breath sounds bilaterally, no wheezing CARDIOVASCULAR: S1, S2 normal. No murmur   ABDOMEN: Soft, nontender, nondistended. EXTREMITIES: left BKA dressing +   NEUROLOGIC: nonfocal  patient is alert and awake SKIN: as above  LABORATORY PANEL:  CBC Recent Labs  Lab 09/05/24 0513  WBC 17.2*  HGB 8.4*  HCT 24.4*  PLT 288    Chemistries  Recent Labs  Lab 09/02/24 0557  NA 138  K 4.1  CL 103  CO2 27  GLUCOSE 184*  BUN 15  CREATININE 0.72  CALCIUM  8.6*     Assessment and Plan  From H&P [name] is a 69 y.o. 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   Patient was recently hospitalized 2 weeks ago for left foot infection including cellulitis, he was treated with IV antibiotics and discharged home on 7 days course of Augmentin  and Zyvox .  Patient told me that however he was not able to get his prescription filled due to transportation problems, this Monday, he was able to went to [facility] to pick up his antibiotics.  But at that time he already noticed worsening of left foot infection and decided to come to the hospital instead of  taking p.o. antibiotics at home.  MRI left foot :1. Postoperative changes related to first through fifth ray  transmetatarsal amputation to the level of the first through fifth  metatarsal bases. Nonspecific increased edema like signal at the  amputation margins of the third, fourth, and fifth metatarsal base  remnants with enhancement. These findings are favored to reflect  sequela of recent postsurgical change, however, osteomyelitis can  not be entirely excluded.  2. Overlying amputation stump with subcutaneous soft tissue edema  and heterogenous fluid signal measuring up to 2.3 x 0.5 cm  demonstrating thin peripheral enhancement extending to the  amputation margins of the first through fifth metatarsal base  remnants, compatible with a postoperative collection of  indeterminate sterility.   Worsening of left foot TMA site/gangrene - Secondary to nonadherence to antibiotics/dressing change and unable to care for self - Review of most recent wound culture showed multiple organisms, plan to continue antibiotic regimen on discharge from last week, Zyvox  + Meropenem  for now.  - Podiatry consulted in ED--recommends vascular consult--Dr Dew aware--will d/w regarding BKA - Blood culture negative so far --Plans for BKA today per Vascular --1/25--s/p BKA--d/w dr Cala to switch to eliquis  and will stop abxs --1/26--Per Dr Marea dressing change tomorrow.   PAD --Status post recent-left common femoral artery, profunda femoris, SFA endarterectomy and patch he angioplasty, mechanical thrombectomy to left SFA and proximal popliteal artery - Continue aspirin  and statin   History of HFrEF Nonobstructive CAD status post recent  LHC Ischemic cardiomyopathy - Euvolemic - Continue p.o. Lasix  - Continue ARB and spironolactone    PAF - In sinus rhythm, continue amiodarone  - Hold off Eliquis  in case patient will need surgical intervention.--Now on IV heparin  gtt--now resumed po eliquis    IDDM with  severe PAD - SSI and lantus   Diarrhea since 1/22 recent antibiotic use -- patient is having type VII diarrheal stool -- elevated white count of 12 -- not on any stool softener or MiraLAX -- infectious disease curb sided regarding sending stool for C. diff. Dr. Searcy recommends hold off for now --1/25--no diarrhea    Procedures: left BKA Family communication :none Consults : vascular, podiatry CODE STATUS: full DVT Prophylaxis :eliquis  Level of care: Med-Surg Status is: Inpatient Remains inpatient appropriate because:check amputation stump dressing tomorrow and likely d/c in am if stable    TOTAL TIME TAKING CARE OF THIS PATIENT35 minutes.  >50% time spent on counselling and coordination of care  Note: This dictation was prepared with Dragon dictation along with smaller phrase technology. Any transcriptional errors that result from this process are unintentional.  Leita Blanch M.D    [facility]   CC: Primary care physician; Norleen Lynwood ORN, MD

## 2024-09-05 NOTE — Progress Notes (Signed)
 Physical Therapy Treatment Patient Details Name: [name] MRN: [id number] DOB: 03/18/1956 Today's Date: 09/05/2024   History of Present Illness 69 year old male presents with falls, failure to thrive and worsening left foot TMA site cellulitis. Recently hospitalized for left transmetatarsal amputation 1 week ago as well as heart catheterization and left common femoral artery, profunda femoris, SFA endarterectomy and patch he angioplasty, mechanical thrombectomy to left SFA and proximal popliteal artery. 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    PT Comments  Patient alert, but very self directive of session, did not display pain signs/symptoms of pain during session. Pt able to minimally adjust KI, but loosened it prior to mobility (and it fell off on way back from bathroom despite education to tighten it). Pt needed maxA to don properly. He displayed impulsivity with movement and decreased safety awareness (PT assist to don shoe, verbal cues to avoid pulling on bathroom towel rack but proceeded to regardless, standing up prior to PT readiness). He was able to hop ~48ft total with RW and CGA and perform pericare in sitting on toilet and while laying in the bed.  Overall the patient demonstrated deficits (see PT Problem List) that impede the patient's functional abilities, safety, and mobility and would benefit from skilled PT intervention. Pt status communicated to care team, encourage further skilled intervention to maximize education, BKA management, and maximize independence to return to PLOF as able.      If plan is discharge home, recommend the following: A little help with walking and/or transfers;Assistance with cooking/housework;Assist for transportation;Help with stairs or ramp for entrance;Direct supervision/assist for  medications management;A little help with bathing/dressing/bathroom   Can travel by private vehicle     Yes  Equipment Recommendations  Other (comment) (TBD)    Recommendations for Other Services       Precautions / Restrictions Precautions Precautions: Fall Recall of Precautions/Restrictions: Intact Restrictions Weight Bearing Restrictions Per Provider Order: Yes LLE Weight Bearing Per Provider Order: Non weight bearing LLE Partial Weight Bearing Percentage or Pounds: new BKA 09/03/24     Mobility  Bed Mobility Overal bed mobility: Modified Independent Bed Mobility: Supine to Sit, Sit to Supine           General bed mobility comments: no physical assist to sit EOB    Transfers Overall transfer level: Needs assistance Equipment used: Rolling walker (2 wheels) Transfers: Sit to/from Stand Sit to Stand: Contact guard assist, Min assist           General transfer comment: minA to don R shoe. pt impulsively stood up prior to PT preparation. minA from standard toilet height    Ambulation/Gait   Gait Distance (Feet): 15 Feet Assistive device: Rolling walker (2 wheels)         General Gait Details: hop to/from bathroom   Stairs             Wheelchair Mobility     Tilt Bed    Modified Rankin (Stroke Patients Only)       Balance Overall balance assessment: Needs assistance Sitting-balance support: Feet supported Sitting balance-Leahy Scale: Normal     Standing balance support: Bilateral upper extremity supported, During functional activity, Reliant on assistive device for balance Standing balance-Leahy Scale: Good  Communication    Cognition Arousal: Alert Behavior During Therapy: Impulsive                           PT - Cognition Comments: pt verbose, resisitive to any education, impulsive with mobility Following commands: Intact (able to follow commands but limited willingness)       Cueing Cueing Techniques: Verbal cues  Exercises      General Comments        Pertinent Vitals/Pain Pain Assessment Pain Assessment: Faces Faces Pain Scale: No hurt    Home Living                          Prior Function            PT Goals (current goals can now be found in the care plan section) Progress towards PT goals: Progressing toward goals    Frequency    Min 4X/week      PT Plan      Co-evaluation              AM-PAC PT 6 Clicks Mobility   Outcome Measure  Help needed turning from your back to your side while in a flat bed without using bedrails?: None Help needed moving from lying on your back to sitting on the side of a flat bed without using bedrails?: None Help needed moving to and from a bed to a chair (including a wheelchair)?: A Little Help needed standing up from a chair using your arms (e.g., wheelchair or bedside chair)?: A Little Help needed to walk in hospital room?: A Little Help needed climbing 3-5 steps with a railing? : A Lot 6 Click Score: 19    End of Session   Activity Tolerance: Patient tolerated treatment well Patient left: in bed;with call bell/phone within reach;with bed alarm set (pt refused OOB) Nurse Communication: Mobility status PT Visit Diagnosis: History of falling (Z91.81);Difficulty in walking, not elsewhere classified (R26.2);Other abnormalities of gait and mobility (R26.89);Pain Pain - Right/Left: Left Pain - part of body: Leg     Time: 1350-1425 PT Time Calculation (min) (ACUTE ONLY): 35 min  Charges:    $Therapeutic Activity: 23-37 mins PT General Charges $$ ACUTE PT VISIT: 1 Visit                     Doyal Shams PT, DPT 3:43 PM,09/05/24

## 2024-09-05 NOTE — Progress Notes (Signed)
 [name]  Daily Progress Note   Subjective  -   Pain remains about the same. In stump and in left thigh.  Has not had PT yet.   Objective Vitals:   09/04/24 1500 09/04/24 1958 09/05/24 0343 09/05/24 0837  BP: (!) 146/67 (!) 137/55 (!) 136/58 (!) 125/55  Pulse: 79 79 76 72  Resp: 18 18 20    Temp: 98.2 F (36.8 C) 97.9 F (36.6 C) 99 F (37.2 C) 98.3 F (36.8 C)  TempSrc: Oral  Oral Oral  SpO2: 98% 99% 98% 97%  Weight:      Height:        Intake/Output Summary (Last 24 hours) at 09/05/2024 0959 Last data filed at 09/05/2024 0532 Gross per 24 hour  Intake --  Output 2930 ml  Net -2930 ml    PULM  CTAB CV  RRR VASC  Dressing remains intact.  Laboratory CBC    Component Value Date/Time   WBC 17.2 (H) 09/05/2024 0513   HGB 8.4 (L) 09/05/2024 0513   HCT 24.4 (L) 09/05/2024 0513   PLT 288 09/05/2024 0513    BMET    Component Value Date/Time   NA 138 09/02/2024 0557   NA 138 06/16/2019 1144   K 4.1 09/02/2024 0557   CL 103 09/02/2024 0557   CO2 27 09/02/2024 0557   GLUCOSE 184 (H) 09/02/2024 0557   BUN 15 09/02/2024 0557   BUN 15 06/16/2019 1144   CREATININE 0.72 09/02/2024 0557   CALCIUM  8.6 (L) 09/02/2024 0557   GFRNONAA >60 09/02/2024 0557   GFRAA >60 02/29/2020 9062    Assessment/Planning: POD #2 s/p left BKA  Doing reasonably well PT for mobilization Likely remove his dressing tomorrow. Likely ready for discharge in 1-2 days   [name]  09/05/2024, 9:59 AM

## 2024-09-05 NOTE — Progress Notes (Signed)
 Occupational Therapy Treatment Patient Details Name: [name] MRN: [id number] DOB: 02-04-1956 Today's Date: 09/05/2024   History of present illness 69 year old male presents with falls, failure to thrive and worsening left foot TMA site cellulitis. Recently hospitalized for left transmetatarsal amputation 1 week ago as well as heart catheterization and left common femoral artery, profunda femoris, SFA endarterectomy and patch he angioplasty, mechanical thrombectomy to left SFA and proximal popliteal artery. 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   OT comments  [name] was seen for OT treatment on this date. Upon arrival to room pt in bed, agreeable to tx. Pt requires CGA + RW for ADL t/f ~15 ft. MAX A periaccess standing to replace sacral patch. Educated on KI, desensitization techniques, and bed level HEP - handout provided. Pt making good progress toward goals, will continue to follow POC. Discharge recommendation remains appropriate.       If plan is discharge home, recommend the following:  A little help with walking and/or transfers;A little help with bathing/dressing/bathroom;Help with stairs or ramp for entrance;Assistance with cooking/housework;A lot of help with bathing/dressing/bathroom;Assist for transportation   Equipment Recommendations  Other (comment)    Recommendations for Other Services      Precautions / Restrictions Precautions Precautions: Fall Recall of Precautions/Restrictions: Intact Restrictions Weight Bearing Restrictions Per Provider Order: Yes LLE Weight Bearing Per Provider Order: Non weight bearing LLE Partial Weight Bearing Percentage or Pounds: new BKA 09/03/24       Mobility Bed Mobility Overal bed mobility: Modified Independent                  Transfers Overall transfer  level: Needs assistance Equipment used: Rolling walker (2 wheels) Transfers: Sit to/from Stand Sit to Stand: Contact guard assist                 Balance Overall balance assessment: Needs assistance Sitting-balance support: Feet supported Sitting balance-Leahy Scale: Normal     Standing balance support: Bilateral upper extremity supported, During functional activity, Reliant on assistive device for balance Standing balance-Leahy Scale: Good                             ADL either performed or assessed with clinical judgement   ADL Overall ADL's : Needs assistance/impaired                                       General ADL Comments: CGA + RW for ADL t/f ~15 ft. MAX A periaccess standing    Extremity/Trunk Assessment Upper Extremity Assessment Upper Extremity Assessment: Overall WFL for tasks assessed   Lower Extremity Assessment Lower Extremity Assessment: Generalized weakness        Vision       Perception     Praxis     Communication Communication Communication: Impaired Factors Affecting Communication: Hearing impaired   Cognition Arousal: Alert Behavior During Therapy: Impulsive Cognition: No family/caregiver present to determine baseline             OT - Cognition Comments: tangential                 Following commands: Intact Following commands impaired: Follows one step commands with increased time  Cueing   Cueing Techniques: Verbal cues  Exercises      Shoulder Instructions       General Comments      Pertinent Vitals/ Pain       Pain Assessment Pain Assessment: Faces Faces Pain Scale: Hurts little more Pain Location: phantom pain LLE Pain Descriptors / Indicators: Operative site guarding Pain Intervention(s): Limited activity within patient's tolerance, Repositioned  Home Living                                          Prior Functioning/Environment               Frequency  Min 2X/week        Progress Toward Goals  OT Goals(current goals can now be found in the care plan section)  Progress towards OT goals: Progressing toward goals  Acute Rehab OT Goals OT Goal Formulation: With patient Time For Goal Achievement: 09/15/24 Potential to Achieve Goals: Fair ADL Goals Pt Will Perform Lower Body Dressing: with modified independence;sit to/from stand;sitting/lateral leans Pt Will Perform Toileting - Clothing Manipulation and hygiene: with modified independence;sitting/lateral leans;sit to/from stand Pt/caregiver will Perform Home Exercise Program: Increased strength;Both right and left upper extremity;With theraband;Independently Additional ADL Goal #1: Pt will demo implementation of NWB to LLE during all ADLs, transfers and mobility to ensure proper healing and prevent infection to LLE.  Plan      Co-evaluation                 AM-PAC OT 6 Clicks Daily Activity     Outcome Measure   Help from another person eating meals?: A Little Help from another person taking care of personal grooming?: A Little Help from another person toileting, which includes using toliet, bedpan, or urinal?: A Lot Help from another person bathing (including washing, rinsing, drying)?: A Lot Help from another person to put on and taking off regular upper body clothing?: A Little Help from another person to put on and taking off regular lower body clothing?: A Lot 6 Click Score: 15    End of Session Equipment Utilized During Treatment: Rolling walker (2 wheels)  OT Visit Diagnosis: Other abnormalities of gait and mobility (R26.89);Muscle weakness (generalized) (M62.81)   Activity Tolerance Patient tolerated treatment well   Patient Left in bed;with call bell/phone within reach;with bed alarm set;with nursing/sitter in room   Nurse Communication          Time: 8450-8393 OT Time Calculation (min): 17 min  Charges: OT General Charges $OT Visit:  1 Visit OT Treatments $Therapeutic Activity: 8-22 mins  Elston Slot, M.S. OTR/L  09/05/24, 4:36 PM  ascom [phone]

## 2024-09-06 ENCOUNTER — Ambulatory Visit: Admitting: Physician Assistant

## 2024-09-06 DIAGNOSIS — W19XXXA Unspecified fall, initial encounter: Secondary | ICD-10-CM | POA: Diagnosis not present

## 2024-09-06 DIAGNOSIS — R627 Adult failure to thrive: Secondary | ICD-10-CM | POA: Diagnosis not present

## 2024-09-06 DIAGNOSIS — Z794 Long term (current) use of insulin: Secondary | ICD-10-CM

## 2024-09-06 DIAGNOSIS — I96 Gangrene, not elsewhere classified: Secondary | ICD-10-CM

## 2024-09-06 DIAGNOSIS — E1151 Type 2 diabetes mellitus with diabetic peripheral angiopathy without gangrene: Secondary | ICD-10-CM

## 2024-09-06 LAB — CULTURE, BLOOD (ROUTINE X 2)
Culture: NO GROWTH
Culture: NO GROWTH

## 2024-09-06 LAB — GLUCOSE, CAPILLARY
Glucose-Capillary: 210 mg/dL — ABNORMAL HIGH (ref 70–99)
Glucose-Capillary: 87 mg/dL (ref 70–99)

## 2024-09-06 MED ORDER — HYDROCODONE-ACETAMINOPHEN 5-325 MG PO TABS: 1.0000 | ORAL_TABLET | Freq: Four times a day (QID) | ORAL | 0 refills | Status: AC | PRN

## 2024-09-06 MED ORDER — TOUJEO MAX SOLOSTAR 300 UNIT/ML ~~LOC~~ SOPN: 25.0000 [IU] | PEN_INJECTOR | Freq: Two times a day (BID) | SUBCUTANEOUS | 3 refills | Status: AC

## 2024-09-06 NOTE — TOC Progression Note (Incomplete)
 Transition of Care [facility]) - Progression Note    Patient Details  Name: [name] MRN: [id number] Date of Birth: 1956-05-18  Transition of Care [facility]) CM/SW Contact  Grayce JAYSON Perfect, RN Phone Number: 09/06/2024, 11:10 AM  Clinical Narrative:                         Expected Discharge Plan and Services         Expected Discharge Date: 09/06/24                                     Social Drivers of Health (SDOH) Interventions SDOH Screenings   Food Insecurity: Food Insecurity Present (09/01/2024)  Housing: High Risk (09/02/2024)  Transportation Needs: Unmet Transportation Needs (09/01/2024)  Utilities: At Risk (09/01/2024)  Alcohol Screen: Low Risk (06/10/2023)  Depression (PHQ2-9): Low Risk (07/05/2024)  Financial Resource Strain: High Risk (03/11/2024)  Physical Activity: Inactive (06/10/2023)  Social Connections: Socially Isolated (09/01/2024)  Stress: Stress Concern Present (03/11/2024)  Tobacco Use: High Risk (08/30/2024)  Health Literacy: Inadequate Health Literacy (02/17/2024)    Readmission Risk Interventions    03/30/2024    2:55 PM 03/09/2024    3:21 PM 12/30/2023    1:17 PM  Readmission Risk Prevention Plan  Post Dischage Appt   Complete  Medication Screening   Complete  Transportation Screening Complete Complete Complete  PCP or Specialist Appt within 5-7 Days  Complete   PCP or Specialist Appt within 3-5 Days Complete    Home Care Screening  Complete   Medication Review (RN CM)  Complete   HRI or Home Care Consult Complete    Social Work Consult for Recovery Care Planning/Counseling Complete    Palliative Care Screening Complete    Medication Review Oceanographer) Referral to Pharmacy

## 2024-09-06 NOTE — Discharge Summary (Signed)
 " Physician Discharge Summary   Patient: [name] MRN: [id number] DOB: 1956-07-17  Admit date:     08/30/2024  Discharge date: 09/06/24  Discharge Physician: Leita Blanch   PCP: Norleen Lynwood LELON, MD   Recommendations at discharge:    F/u Dr Marea vascular surgery in 3 weeks. Follow amputation stump dressing changes per Dr Marea F/u PCP in 1-2 weeks  Discharge Diagnoses: Principal Problem:   Cellulitis of foot Active Problems:   Cellulitis of left foot   Failure to thrive in adult   Diarrhea From H&P [name] is a 69 y.o. 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   Patient was recently hospitalized 2 weeks ago for left foot infection including cellulitis, he was treated with IV antibiotics and discharged home on 7 days course of Augmentin  and Zyvox .  Patient told me that however he was not able to get his prescription filled due to transportation problems, this Monday, he was able to went to [facility] to pick up his antibiotics.  But at that time he already noticed worsening of left foot infection and decided to come to the hospital instead of taking p.o. antibiotics at home.   MRI left foot :1. Postoperative changes related to first through fifth ray  transmetatarsal amputation to the level of the first through fifth  metatarsal bases. Nonspecific increased edema like signal at the  amputation margins of the third, fourth, and fifth metatarsal base  remnants with enhancement. These findings are favored to reflect  sequela of recent postsurgical change, however, osteomyelitis can  not be entirely excluded.  2. Overlying amputation stump with subcutaneous  soft tissue edema  and heterogenous fluid signal measuring up to 2.3 x 0.5 cm  demonstrating thin peripheral enhancement extending to the  amputation margins of the first through fifth metatarsal base  remnants, compatible with a postoperative collection of  indeterminate sterility.    Worsening of left foot TMA site/gangrene - Secondary to nonadherence to antibiotics/dressing change and unable to care for self - Review of most recent wound culture showed multiple organisms, plan to continue antibiotic regimen on discharge from last week, Zyvox  + Meropenem  for now.  - Podiatry consulted in ED--recommends vascular consult--Dr Dew aware--will d/w regarding BKA - Blood culture negative so far --Plans for BKA today per Vascular --1/25--s/p BKA--d/w dr Cala to switch to eliquis  and will stop abxs --1/26--Per Dr Marea dressing change tomorrow. --1/27--stump looks good. Dressing instructions per Dr Dew--f/u in 3 weeks   PAD --Status post recent-left common femoral artery, profunda femoris, SFA endarterectomy and patch he angioplasty, mechanical thrombectomy to left SFA and proximal popliteal artery - Continue aspirin  and statin   History of HFrEF Nonobstructive CAD status post recent LHC Ischemic cardiomyopathy - Euvolemic - Continue Lasix ,ARB and spironolactone    PAF - In sinus rhythm, continue amiodarone  - eliquis  now resumed   IDDM with severe PAD - SSI and lantus    Diarrhea since 1/22 recent antibiotic use -- patient is having type VII diarrheal stool -- elevated white count of 12 -- not on any stool softener or MiraLAX -- infectious disease curb sided regarding sending stool for C. diff. Dr. Ravisankar recommends hold off for now --1/25--no diarrhea   Ok  to d/c to rehab per vascualr surgery. Pt agreeable   Procedures: left BKA Family communication :none Consults : vascular, podiatry CODE STATUS: full DVT Prophylaxis :eliquis       Pain control - [place]   Controlled Substance Reporting System database was reviewed. and patient was instructed, not to drive, operate heavy machinery, perform activities at heights, swimming or participation in water  activities or provide baby-sitting services while on Pain, Sleep and Anxiety Medications; until their outpatient Physician has advised to do so again. Also recommended to not to take more than prescribed Pain, Sleep and Anxiety Medications.   Disposition: Rehabilitation facility Diet recommendation:  Cardiac and Carb modified diet DISCHARGE MEDICATION: Allergies as of 09/06/2024       Reactions   Cefepime  Hives   Had allergic reaction to one of these 3 agents, unclear which one   Metoprolol  Hives   Had allergic reaction to one of these 3 agents, unclear which one *Per RN, highly likely this is the cause of allergic rxn   Myrbetriq  [mirabegron ] Hives   Had allergic reaction to one of these 3 agents, unclear which one        Medication List     STOP taking these medications    ascorbic acid  500 MG tablet Commonly known as: VITAMIN C        TAKE these medications    Accu-Chek Guide Test test strip Generic drug: glucose blood 1 each by Other route 3 (three) times daily. Use as instructed   Accu-Chek Guide w/Device Kit 1 Device by Does not apply route daily in the afternoon.   albuterol  108 (90 Base) MCG/ACT inhaler Commonly known as: VENTOLIN  HFA Inhale 2 puffs into the lungs every 4 (four) hours as needed for wheezing or shortness of breath.   amiodarone  200 MG tablet Commonly known as: PACERONE  Take 200 mg by mouth daily.   Aspirin  EC Adult Low Dose 81 MG tablet Generic drug: aspirin  EC Take 1 tablet (81 mg total) by mouth daily. Swallow whole.   atorvastatin  40 MG tablet Commonly known as: LIPITOR  TAKE 1 TABLET BY MOUTH ONCE DAILY   citalopram  20 MG tablet Commonly known as: CELEXA  Take 1 tablet (20 mg total) by mouth daily.   Dexcom G7 Sensor Misc Change every 10  days   Eliquis  5 MG Tabs tablet Generic drug: apixaban  Take 5 mg by mouth 2 (two) times daily.   feeding supplement (NEPRO CARB STEADY) Liqd Take 237 mLs by mouth 3 (three) times daily between meals.   furosemide  40 MG tablet Commonly known as: LASIX  Take 40 mg by mouth daily.   HYDROcodone -acetaminophen  5-325 MG tablet Commonly known as: NORCO/VICODIN Take 1-2 tablets by mouth every 6 (six) hours as needed for moderate pain (pain score 4-6).   irbesartan  300 MG tablet Commonly known as: AVAPRO  Take 1 tablet (300 mg total) by mouth daily.   Multivitamin Tabs Take 1 tablet by mouth daily.   pantoprazole  40 MG tablet Commonly known as: PROTONIX  Take 1 tablet (40 mg total) by mouth daily.   spironolactone  25 MG tablet Commonly known as: ALDACTONE  Take 25 mg by mouth daily.   Toujeo  Max SoloStar 300 UNIT/ML Solostar Pen Generic drug: insulin  glargine (2 Unit Dial ) Inject 25 Units into the skin 2 (two) times daily. What changed:  how much to take when to take this               Discharge Care Instructions  (From admission, onward)  Start     Ordered   09/06/24 0000  Discharge wound care:       Comments: Per Dr Marea Apply xerofoam and wrap with kerlix daily   09/06/24 0935   09/06/24 0000  Change dressing (specify)       Comments: Dressing change: daily times per day using xerofoam and wrap with kerlix.   09/06/24 0935            Contact information for follow-up providers     Norleen Lynwood ORN, MD. Schedule an appointment as soon as possible for a visit in 1 week(s).   Specialties: Internal Medicine, Radiology Why: s/pbka Contact information: [address] [place] [address] [phone]         Marea Selinda RAMAN, MD. Schedule an appointment as soon as possible for a visit in 3 week(s).   Specialties: Vascular Surgery, Radiology, Interventional Cardiology Why: s/p BKA follow up Contact information: [address] [place] [address] [phone]              Contact information for after-discharge care     Destination     [facility]   Service: Skilled Nursing Contact information: [name] [place]  [address] [phone]                    Discharge Exam: Fredricka Weights   08/30/24 1848 09/02/24 2112  Weight: 68 kg 78.1 kg   Amputation stump looks great! Staples+  Condition at discharge: fair  The results of significant diagnostics from this hospitalization (including imaging, microbiology, ancillary and laboratory) are listed below for reference.   Imaging Studies: MR FOOT LEFT W WO CONTRAST Result Date: 09/01/2024 CLINICAL DATA:  Patient reports increased redness at the amputation stump. Status post first through fifth ray transmetatarsal amputation on 08/18/2024. EXAM: MRI OF THE LEFT FOREFOOT WITHOUT AND WITH CONTRAST TECHNIQUE: Multiplanar, multisequence MR imaging of the left foot was performed both before and after administration of intravenous contrast. CONTRAST:  7mL GADAVIST  GADOBUTROL  1 MMOL/ML IV SOLN COMPARISON:  Radiographs of the left foot dated 08/30/2024. MRI of the left foot dated 08/10/2024. FINDINGS: Bones/Joint/Cartilage Postoperative changes related to first through fifth ray transmetatarsal amputation to the level of the first through fifth metatarsal bases. There is nonspecific increased T2/STIR marrow signal at the amputation margins of the third, fourth, and fifth metatarsal base remnants with enhancement. These findings are favored to reflect sequela of recent postsurgical change, however, osteomyelitis can not be entirely excluded. Overlying amputation stump with subcutaneous soft tissue edema and heterogenous fluid signal measuring up to 2.3 cm craniocaudal by 0.5 cm AP demonstrating thin peripheral enhancement extending to the amputation margins of the first through fifth metatarsal base remnants, compatible with a  postoperative collection of indeterminate sterility. Cutaneous staples with associated susceptibility artifact. Mild degenerative changes of the hindfoot and midfoot with dorsal talar spurring. Ligaments Medial and lateral ankle ligaments appear grossly intact. Lisfranc ligament appears intact. Muscles and Tendons Musculotendinous postoperative changes. No significant tenosynovial fluid collection. Increased T2 signal of the musculature without significant associated enhancement likely reflects chronic denervation changes. Soft tissue Postoperative changes as described above. Nonspecific subcutaneous edema of the ankle extending through the dorsal foot. IMPRESSION: 1. Postoperative changes related to first through fifth ray transmetatarsal amputation to the level of the first through fifth metatarsal bases. Nonspecific increased edema like signal at the amputation margins of the third, fourth, and fifth metatarsal base remnants with enhancement.  These findings are favored to reflect sequela of recent postsurgical change, however, osteomyelitis can not be entirely excluded. 2. Overlying amputation stump with subcutaneous soft tissue edema and heterogenous fluid signal measuring up to 2.3 x 0.5 cm demonstrating thin peripheral enhancement extending to the amputation margins of the first through fifth metatarsal base remnants, compatible with a postoperative collection of indeterminate sterility. Electronically Signed   By: Harrietta Sherry M.D.   On: 09/01/2024 12:44   DG Chest 1 View Result Date: 09/01/2024 EXAM: 1 VIEW(S) XRAY OF THE CHEST 09/01/2024 11:08:00 AM COMPARISON: 07/13/2024 CLINICAL HISTORY: Congestive heart failure. ICD10: [id number] Congestive heart failure. FINDINGS: LUNGS AND PLEURA: Persistent trace left pleural effusion with associated left basilar opacity. No pneumothorax. HEART AND MEDIASTINUM: Aortic atherosclerosis. No acute abnormality of the cardiac and mediastinal silhouettes. BONES AND SOFT  TISSUES: Old left clavicular fracture. IMPRESSION: 1. Persistent trace left pleural effusion with associated left basilar atelectasis. Electronically signed by: Rogelia Myers MD 09/01/2024 11:29 AM EST RP Workstation: [id number]   DG Foot 2 Views Left Result Date: 08/30/2024 EXAM: 2 Views XRAY of the Left Foot 08/30/2024 09:15:00 PM COMPARISON: 08/18/2024. CLINICAL HISTORY: Post op pain FINDINGS: Changes of transmetatarsal amputation of all 5 left toes. No change since prior study. No acute bony abnormality. SOFT TISSUES: Unremarkable. Surgical clips along the stump. IMPRESSION: 1. 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 Electronically signed by: Franky Crease MD 08/30/2024 09:31 PM EST RP Workstation: [id number]   DG Foot 2 Views Left Result Date: 08/18/2024 EXAM: 2 VIEW(S) XRAY OF THE LEFT FOOT 08/18/2024 02:02:36 PM COMPARISON: 08/10/2024 CLINICAL HISTORY: Post-operative state FINDINGS: BONES AND JOINTS: Midfoot surgical staples in place. Postoperative changes. Status post transmetatarsal amputation of the first through fifth ray. Plantar and posterior calcaneal spurs. Expected postsurgical changes. SOFT TISSUES: Soft tissue swelling and subcutaneous emphysema along the plantar surface of the forefoot, consistent with postsurgical changes. IMPRESSION: 1. Status post transmetatarsal amputation of the first through fifth ray with expected postsurgical changes manifested by soft tissue swelling and subcutaneous emphysema along the plantar surface of the forefoot. Electronically signed by: Greig Pique MD MD 08/18/2024 07:34 PM EST RP Workstation: [id number]   DG C-Arm 1-60 Min-No Report Result Date: 08/17/2024 Fluoroscopy was utilized by the requesting physician.  No radiographic interpretation.   CARDIAC CATHETERIZATION Result Date: 08/16/2024   Mid LAD lesion is 50% stenosed.   Ost LM lesion is 30% stenosed.   There is  moderate to severe left ventricular systolic dysfunction.   LV end diastolic pressure is mildly elevated.   There is no aortic valve stenosis.   Anticipated discharge date to be determined.   Continue heparin .  Defer continuation of aspirin  as well as transition back to apixaban  to vascular surgery/internal medicine. Conclusions: Mild-moderate, nonobstructive coronary artery disease with 30% ostial LMCA and 50% mid LAD stenoses that are not hemodynamically significant (RFR 0.92). Mildly elevated left heart filling pressures (PCWP 20, LVEDP 18 mmHg). Upper normal right heart filling pressure (mean RA 5, RV EDP 6 mmHg). Moderate pulmonary hypertension (PA 60/22, mean 35 mmHg; PVR 3 WU). Normal Fick cardiac output/index (CO 5.0 L/min, CI 2.7 L/min/m). Recommendations: Continue medical therapy to prevent progression of moderate, nonobstructive CAD. Continue gentle diuresis and escalation of goal-directed medical therapy for nonischemic cardiomyopathy. Lonni Hanson, MD [facility]  PERIPHERAL VASCULAR CATHETERIZATION Result Date: 08/15/2024 See surgical note for result.  US  ARTERIAL ABI (SCREENING LOWER EXTREMITY) Result Date: 08/12/2024 CLINICAL  DATA:  Peripheral arterial disease. EXAM: NONINVASIVE PHYSIOLOGIC VASCULAR STUDY OF BILATERAL LOWER EXTREMITIES TECHNIQUE: Evaluation of both lower extremities were performed at rest, including calculation of ankle-brachial indices with single level Doppler, pressure and pulse volume recording. COMPARISON:  Lower extremity arterial duplex 08/11/2024 FINDINGS: Right ABI:  0.59 Left ABI:  0.45 Right Lower Extremity: Predominantly monophasic waveforms in the right ankle. Left Lower Extremity:  Irregular waveforms at the left ankle. 0.5-0.79 Moderate PAD IMPRESSION: 1. Right ABI is 0.59 and compatible with moderate peripheral arterial disease. 2. Left ABI is 0.45 and compatible with severe peripheral arterial disease. Electronically Signed   By: Juliene Balder M.D.   On:  08/12/2024 11:11   US  ARTERIAL LOWER EXTREMITY DUPLEX BILATERAL Result Date: 08/11/2024 CLINICAL DATA:  69 year old history of peripheral artery disease. EXAM: BILATERAL LOWER EXTREMITY ARTERIAL DUPLEX SCAN TECHNIQUE: Gray-scale sonography as well as color Doppler and duplex ultrasound was performed to evaluate the arteries of both lower extremities including the common, superficial and profunda femoral arteries, popliteal artery and calf arteries. COMPARISON:  None Available. FINDINGS: Right Lower Extremity ABI: Not obtained. Inflow: Normal common femoral arterial waveforms and velocities. No evidence of inflow (aortoiliac) disease. Outflow: Occluded appearance proximal limits. The distal superficial femoral and popliteal arteries demonstrate monophasic waveforms sluggish velocities of 26 centimeters/second Runoff: Monophasic posterior and anterior tibial arterial waveforms. Vessels are patent to the ankle. Left Lower Extremity ABI: Not obtained. Inflow: Normal common femoral arterial waveforms and velocities. No evidence of inflow (aortoiliac) disease. Outflow: The superficial femoral artery appears patent proximally and distally with absent flow in the midportion. Sluggish, monophasic waveforms in the patent portions. The popliteal artery appears patent with monophasic waveforms. Runoff: Monophasic posterior and anterior tibial arterial waveforms. Vessels are patent to the ankle. IMPRESSION: 1. 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 2. Occlusion of the mid left superficial femoral artery with distal reconstitution. 3. No definite evidence of inflow disease. 4. Patent bilateral runoff vessels. Ester Sides, MD [facility] Electronically Signed   By: Ester Sides M.D.   On: 08/11/2024 17:46   MR FOOT LEFT WO CONTRAST Result Date: 08/11/2024 CLINICAL DATA:  Pain.  Chronic plantar foot wound.  Recent fall.  EXAM: MRI OF THE LEFT FOOT WITHOUT CONTRAST TECHNIQUE: Multiplanar, multisequence MR imaging of the left foot was performed. No intravenous contrast was administered. COMPARISON:  Radiographs left foot dated 08/10/2024. MRI of the left foot dated 03/28/2024. FINDINGS: Bones/Joint/Cartilage Status post amputation of the great toe and fifth toe. Soft tissue wound at the plantar forefoot overlying the level of the first metatarsal head with heterogenous fluid signal extending into the subcutaneous tissues at the level of the amputation stump measuring approximately 1.5 x 1.3 x 1.0 cm with foci of susceptibility artifact likely reflecting soft tissue air communicating from the overlying open wound. Bipartite medial hallux sesamoid with similar-appearing increased T2 signal compared to the prior exam. No convincing evidence of acute osteomyelitis at this time. Ligaments Collateral ligaments of the remaining MTP joints appear intact. Lisfranc ligament is intact. Muscles and Tendons Redemonstrated generalized muscular edema without discrete intramuscular collection. No significant tenosynovitis. 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 Soft tissue Soft tissue wound at the plantar medial forefoot, as described above. Subcutaneous edema of the dorsal foot. IMPRESSION: 1. Soft tissue wound at the plantar medial forefoot overlying the level of the first metatarsal head with heterogenous fluid signal extending into the subcutaneous tissues of  the amputation stump measuring approximately 1.5 x 1.3 x 1.0 cm, possibly representing organizing phlegmon, with foci of susceptibility artifact likely reflecting soft tissue air communicating from the overlying open wound. 2. No convincing evidence of acute osteomyelitis at this time. 3. Bipartite medial hallux sesamoid with similar-appearing increased T2 signal compared to the prior exam. 4. Status post amputation of the great toe and fifth  toe. 5. 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 Electronically Signed   By: Harrietta Sherry M.D.   On: 08/11/2024 10:30   US  Venous Img Lower Unilateral Left (DVT) Result Date: 08/10/2024 EXAM: ULTRASOUND DUPLEX OF THE LEFT LOWER EXTREMITY VEINS TECHNIQUE: Duplex ultrasound using B-mode/gray scaled imaging and Doppler spectral analysis and color flow was obtained of the deep venous structures of the left lower extremity. COMPARISON: None available. CLINICAL HISTORY: Left leg erythema. FINDINGS: The common femoral vein, femoral vein, popliteal vein, and posterior tibial vein demonstrate normal compressibility with normal color flow and spectral analysis. IMPRESSION: 1. No evidence of deep venous thrombosis. Electronically signed by: Greig Pique MD 08/10/2024 11:57 PM EST RP Workstation: [id number]   DG Foot 2 Views Left Result Date: 08/10/2024 EXAM: 2 VIEW(S) XRAY OF THE LEFT FOOT 08/10/2024 09:34:00 PM COMPARISON: 03/29/2024 CLINICAL HISTORY: fall, (no foot injury) erythema, eval for gas in mid foot (appears to have a hole in bottom of L foot (chronic appearing)) FINDINGS: BONES AND JOINTS: Status post amputation of the great toe and fifth toe at the level of the MTP joint. Mild cortical irregularity along the medial margin of the remaining first metatarsal head, suspicious for osteomyelitis. Small superior and plantar calcaneal spurs. SOFT TISSUES: Soft tissue swelling along the plantar surface of the forefoot at the amputation site with subcutaneous gas. IMPRESSION: 1. Soft tissue swelling along the plantar surface of the forefoot at the amputation site with subcutaneous gas. 2. Mild cortical irregularity along the medial margin of the remaining first metatarsal head, which could indicate osteomyelitis. 3. 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 Electronically signed by: Franky Stanford MD 08/10/2024 10:16 PM EST RP  Workstation: [id number]   CT Head Wo Contrast Result Date: 08/10/2024 EXAM: CT HEAD WITHOUT CONTRAST 08/10/2024 09:28:38 PM TECHNIQUE: CT of the head was performed without the administration of intravenous contrast. Automated exposure control, iterative reconstruction, and/or weight based adjustment of the mA/kV was utilized to reduce the radiation dose to as low as reasonably achievable. COMPARISON: 07/13/2024 CLINICAL HISTORY: Facial trauma, blunt; fall, on anticoag. FINDINGS: BRAIN AND VENTRICLES: No acute hemorrhage. No evidence of acute infarct. No hydrocephalus. No extra-axial collection. No mass effect or midline shift. Stable age-related cerebral volume loss. Stable mild periventricular white matter disease. ORBITS: Bilateral lens replacement. SINUSES: Mucosal disease within ethmoid air cells and left maxillary sinus. SOFT TISSUES AND SKULL: No acute soft tissue abnormality. No skull fracture. VASCULATURE: Moderate to heavy calcific plaque within carotid siphons and vertebral arteries. IMPRESSION: 1. No acute intracranial abnormality. Electronically signed by: Donnice Mania MD 08/10/2024 09:38 PM EST RP Workstation: [id number]    Microbiology: Results for orders placed or performed during the hospital encounter of 08/30/24  Blood culture (routine x 2)     Status: None   Collection Time: 09/01/24  9:47 AM   Specimen: BLOOD  Result Value Ref Range Status   Specimen Description BLOOD BLOOD RIGHT FOREARM  Final   Special Requests   Final    BOTTLES DRAWN AEROBIC AND ANAEROBIC  Blood Culture results may not be optimal due to an inadequate volume of blood received in culture bottles   Culture   Final    NO GROWTH 5 DAYS Performed at [facility], [name] [place], [place] [address]    Report Status 09/06/2024 FINAL  Final  Blood culture (routine x 2)     Status: None   Collection Time: 09/01/24  9:49 AM   Specimen: BLOOD  Result Value Ref Range Status   Specimen Description  BLOOD LEFT ANTECUBITAL  Final   Special Requests   Final    BOTTLES DRAWN AEROBIC AND ANAEROBIC Blood Culture results may not be optimal due to an inadequate volume of blood received in culture bottles   Culture   Final    NO GROWTH 5 DAYS Performed at [facility], [address] [place], [place] [address]    Report Status 09/06/2024 FINAL  Final    Labs: CBC: Recent Labs  Lab 08/30/24 1900 09/01/24 0838 09/02/24 0557 09/03/24 0150 09/05/24 0513  WBC 14.1* 13.3* 12.7* 13.8* 17.2*  NEUTROABS  --  9.9*  --   --   --   HGB 11.6* 10.4* 10.1* 9.6* 8.4*  HCT 33.3* 30.9* 29.9* 28.2* 24.4*  MCV 93.8 94.8 95.5 93.7 92.8  PLT 463* 355 302 275 288   Basic Metabolic Panel: Recent Labs  Lab 08/30/24 1900 09/01/24 0930 09/02/24 0557  NA 140 138 138  K 3.9 3.7 4.1  CL 101 102 103  CO2 28 27 27   GLUCOSE 263* 161* 184*  BUN 14 12 15   CREATININE 0.87 0.87 0.72  CALCIUM  8.6* 8.7* 8.6*   CBG: Recent Labs  Lab 09/05/24 0903 09/05/24 1207 09/05/24 1521 09/05/24 2037 09/06/24 0750  GLUCAP 126* 119* 126* 176* 210*    Discharge time spent: greater than 30 minutes.  Signed: Leita Blanch, MD [facility] 09/06/2024 "

## 2024-09-06 NOTE — TOC Transition Note (Signed)
 Transition of Care [facility]) - Discharge Note   Patient Details  Name: [name] MRN: [id number] Date of Birth: 10-Oct-1955  [facility]) CM/SW Contact:  Grayce JAYSON Perfect, RN Phone Number: 09/06/2024, 2:32 PM   Clinical Narrative:   RNCM spoke with Tammy at [facility].  They have bed available 123B today.  RNCM have unit nurse name and number to call report.  [facility] arranged, 6th on list.   Patient aware of this plan and agreeable for short term rehab.  No further TOC needs at this time    Final next level of care: Skilled Nursing Facility     Patient Goals and CMS Choice Patient states their goals for this hospitalization and ongoing recovery are:: to return home after rehab stay          Discharge Placement              Patient chooses bed at:  [facility] [place]) Patient to be transferred to facility by:  [facility]) Name of family member notified: Patient will notify needed family members Patient and family notified of of transfer: 09/06/24  Discharge Plan and Services Additional resources added to the After Visit Summary for                                       Social Drivers of Health (SDOH) Interventions SDOH Screenings   Food Insecurity: Food Insecurity Present (09/01/2024)  Housing: High Risk (09/02/2024)  Transportation Needs: Unmet Transportation Needs (09/01/2024)  Utilities: At Risk (09/01/2024)  Alcohol Screen: Low Risk (06/10/2023)  Depression (PHQ2-9): Low Risk (07/05/2024)  Financial Resource Strain: High Risk (03/11/2024)  Physical Activity: Inactive (06/10/2023)  Social Connections: Socially Isolated (09/01/2024)  Stress: Stress Concern Present (03/11/2024)  Tobacco Use: High Risk (08/30/2024)  Health Literacy: Inadequate Health Literacy (02/17/2024)     Readmission Risk Interventions    03/30/2024    2:55 PM 03/09/2024    3:21 PM 12/30/2023    1:17 PM  Readmission Risk Prevention Plan  Post Dischage Appt    Complete  Medication Screening   Complete  Transportation Screening Complete Complete Complete  PCP or Specialist Appt within 5-7 Days  Complete   PCP or Specialist Appt within 3-5 Days Complete    Home Care Screening  Complete   Medication Review (RN CM)  Complete   HRI or Home Care Consult Complete    Social Work Consult for Recovery Care Planning/Counseling Complete    Palliative Care Screening Complete    Medication Review Oceanographer) Referral to Pharmacy

## 2024-09-06 NOTE — Progress Notes (Signed)
 Mobility Specialist - Progress Note    09/06/24 1200  Mobility  Activity Stood with assistance;Ambulated with assistance;Dangled on edge of bed  Level of Assistance Contact guard assist, steadying assist  Assistive Device Front wheel walker  Distance Ambulated (ft) 10 ft  Range of Motion/Exercises Active  LLE Weight Bearing Per Provider Order NWB  Activity Response Tolerated well  Mobility Referral Yes  Mobility visit 1 Mobility   Pt resting in bed eating upon entry on RA. Pt STS indep x2 and ambulates to door and back CGA with RW. Pt returned to sitting EOB and left with needs in reach. Bed alarm activated.   Guido Rumble Mobility Specialist 09/06/24, 12:22 PM

## 2024-09-06 NOTE — Progress Notes (Signed)
 [facility]  Daily Progress Note   Subjective  -   Patient doing well.  Pain is tolerable with oral medications.  Working with therapy.  Objective Vitals:   09/05/24 1520 09/05/24 2001 09/06/24 0351 09/06/24 0749  BP: 130/73 (!) 147/54 (!) 154/56 (!) 169/66  Pulse: 72 72 78 80  Resp:  16 16 18   Temp: 98.5 F (36.9 C) 98.7 F (37.1 C) 97.8 F (36.6 C) 99 F (37.2 C)  TempSrc:  Oral  Oral  SpO2: 96% 100% 98% 99%  Weight:      Height:        Intake/Output Summary (Last 24 hours) at 09/06/2024 0954 Last data filed at 09/06/2024 0351 Gross per 24 hour  Intake --  Output 1250 ml  Net -1250 ml    PULM  CTAB CV  RRR VASC  incision is clean, dry, and intact when the dressing was removed today.  Laboratory CBC    Component Value Date/Time   WBC 17.2 (H) 09/05/2024 0513   HGB 8.4 (L) 09/05/2024 0513   HCT 24.4 (L) 09/05/2024 0513   PLT 288 09/05/2024 0513    BMET    Component Value Date/Time   NA 138 09/02/2024 0557   NA 138 06/16/2019 1144   K 4.1 09/02/2024 0557   CL 103 09/02/2024 0557   CO2 27 09/02/2024 0557   GLUCOSE 184 (H) 09/02/2024 0557   BUN 15 09/02/2024 0557   BUN 15 06/16/2019 1144   CREATININE 0.72 09/02/2024 0557   CALCIUM  8.6 (L) 09/02/2024 0557   GFRNONAA >60 09/02/2024 0557   GFRAA >60 02/29/2020 9062    Assessment/Planning: POD #3 s/p left BKA  Wound is healing well Okay to discharge to rehab from our point of view.  Would do Xeroform and Kerlix wrap dressing changes to the stump daily Continue PT and continue to work on straightening his knee which she is doing a good job with. Follow-up with us  in clinic in 3 weeks for staple removal   Selinda Gu  09/06/2024, 9:54 AM

## 2024-09-06 NOTE — Discharge Instructions (Signed)
 Per Dr Marea-- Left BKA stump--apply Xerofoam and wrap with kerlix daily

## 2024-09-07 LAB — SURGICAL PATHOLOGY

## 2024-09-08 ENCOUNTER — Telehealth: Payer: Self-pay | Admitting: *Deleted

## 2024-09-13 ENCOUNTER — Encounter: Payer: Self-pay | Admitting: Family Medicine

## 2024-09-13 ENCOUNTER — Encounter (INDEPENDENT_AMBULATORY_CARE_PROVIDER_SITE_OTHER): Payer: Self-pay | Admitting: Vascular Surgery

## 2024-09-13 ENCOUNTER — Ambulatory Visit: Admitting: Family Medicine

## 2024-09-13 ENCOUNTER — Ambulatory Visit (INDEPENDENT_AMBULATORY_CARE_PROVIDER_SITE_OTHER): Admitting: Vascular Surgery

## 2024-09-13 VITALS — BP 156/66 | HR 94 | Resp 18 | Ht 68.0 in | Wt 155.0 lb

## 2024-09-13 DIAGNOSIS — I10 Essential (primary) hypertension: Secondary | ICD-10-CM

## 2024-09-13 DIAGNOSIS — I70262 Atherosclerosis of native arteries of extremities with gangrene, left leg: Secondary | ICD-10-CM

## 2024-09-13 DIAGNOSIS — Z794 Long term (current) use of insulin: Secondary | ICD-10-CM

## 2024-09-13 DIAGNOSIS — E1151 Type 2 diabetes mellitus with diabetic peripheral angiopathy without gangrene: Secondary | ICD-10-CM

## 2024-09-13 NOTE — Assessment & Plan Note (Signed)
 blood glucose control important in reducing the progression of atherosclerotic disease. Also, involved in wound healing. On appropriate medications.

## 2024-09-13 NOTE — Assessment & Plan Note (Signed)
 blood pressure control important in reducing the progression of atherosclerotic disease. On appropriate oral medications.

## 2024-09-23 ENCOUNTER — Ambulatory Visit (INDEPENDENT_AMBULATORY_CARE_PROVIDER_SITE_OTHER): Admitting: Vascular Surgery

## 2024-09-26 ENCOUNTER — Ambulatory Visit (INDEPENDENT_AMBULATORY_CARE_PROVIDER_SITE_OTHER): Admitting: Nurse Practitioner

## 2024-10-06 ENCOUNTER — Telehealth: Payer: Self-pay | Admitting: *Deleted
